# Patient Record
Sex: Male | Born: 1945 | Race: White | Hispanic: No | Smoking: Former smoker
Health system: Southern US, Community
[De-identification: ages and names within clinical notes are randomized; demographics above are authoritative.]

## PROBLEM LIST (undated history)

## (undated) DIAGNOSIS — F329 Major depressive disorder, single episode, unspecified: Secondary | ICD-10-CM

## (undated) DIAGNOSIS — I509 Heart failure, unspecified: Secondary | ICD-10-CM

## (undated) DIAGNOSIS — Z960 Presence of urogenital implants: Secondary | ICD-10-CM

## (undated) DIAGNOSIS — M1990 Unspecified osteoarthritis, unspecified site: Secondary | ICD-10-CM

## (undated) DIAGNOSIS — I2510 Atherosclerotic heart disease of native coronary artery without angina pectoris: Secondary | ICD-10-CM

## (undated) DIAGNOSIS — E1143 Type 2 diabetes mellitus with diabetic autonomic (poly)neuropathy: Secondary | ICD-10-CM

## (undated) DIAGNOSIS — R112 Nausea with vomiting, unspecified: Secondary | ICD-10-CM

## (undated) DIAGNOSIS — M545 Low back pain, unspecified: Secondary | ICD-10-CM

## (undated) DIAGNOSIS — E785 Hyperlipidemia, unspecified: Secondary | ICD-10-CM

## (undated) DIAGNOSIS — R197 Diarrhea, unspecified: Secondary | ICD-10-CM

## (undated) DIAGNOSIS — Z9889 Other specified postprocedural states: Secondary | ICD-10-CM

## (undated) DIAGNOSIS — I82409 Acute embolism and thrombosis of unspecified deep veins of unspecified lower extremity: Secondary | ICD-10-CM

## (undated) DIAGNOSIS — F32A Depression, unspecified: Secondary | ICD-10-CM

## (undated) DIAGNOSIS — G8929 Other chronic pain: Secondary | ICD-10-CM

## (undated) DIAGNOSIS — I639 Cerebral infarction, unspecified: Secondary | ICD-10-CM

## (undated) DIAGNOSIS — N319 Neuromuscular dysfunction of bladder, unspecified: Secondary | ICD-10-CM

## (undated) DIAGNOSIS — E669 Obesity, unspecified: Secondary | ICD-10-CM

## (undated) DIAGNOSIS — G4733 Obstructive sleep apnea (adult) (pediatric): Secondary | ICD-10-CM

## (undated) DIAGNOSIS — Z978 Presence of other specified devices: Secondary | ICD-10-CM

## (undated) DIAGNOSIS — E1140 Type 2 diabetes mellitus with diabetic neuropathy, unspecified: Secondary | ICD-10-CM

## (undated) DIAGNOSIS — N3289 Other specified disorders of bladder: Secondary | ICD-10-CM

## (undated) DIAGNOSIS — K219 Gastro-esophageal reflux disease without esophagitis: Secondary | ICD-10-CM

## (undated) DIAGNOSIS — E1165 Type 2 diabetes mellitus with hyperglycemia: Secondary | ICD-10-CM

## (undated) DIAGNOSIS — I10 Essential (primary) hypertension: Secondary | ICD-10-CM

## (undated) DIAGNOSIS — E119 Type 2 diabetes mellitus without complications: Secondary | ICD-10-CM

## (undated) DIAGNOSIS — E1169 Type 2 diabetes mellitus with other specified complication: Secondary | ICD-10-CM

## (undated) DIAGNOSIS — J189 Pneumonia, unspecified organism: Secondary | ICD-10-CM

## (undated) HISTORY — DX: Type 2 diabetes mellitus with diabetic autonomic (poly)neuropathy: E11.43

## (undated) HISTORY — PX: CHOLECYSTECTOMY OPEN: SUR202

## (undated) HISTORY — DX: Type 2 diabetes mellitus with hyperglycemia: E11.65

## (undated) HISTORY — PX: GASTROPLASTY: SHX192

## (undated) HISTORY — PX: JOINT REPLACEMENT: SHX530

## (undated) HISTORY — DX: Atherosclerotic heart disease of native coronary artery without angina pectoris: I25.10

## (undated) HISTORY — PX: CARPAL TUNNEL RELEASE: SHX101

---

## 1979-05-12 DIAGNOSIS — I82409 Acute embolism and thrombosis of unspecified deep veins of unspecified lower extremity: Secondary | ICD-10-CM

## 1979-05-12 HISTORY — DX: Acute embolism and thrombosis of unspecified deep veins of unspecified lower extremity: I82.409

## 1989-05-11 HISTORY — PX: TOTAL KNEE ARTHROPLASTY: SHX125

## 1997-09-10 DIAGNOSIS — J189 Pneumonia, unspecified organism: Secondary | ICD-10-CM

## 1997-09-10 HISTORY — DX: Pneumonia, unspecified organism: J18.9

## 1998-08-23 ENCOUNTER — Inpatient Hospital Stay (HOSPITAL_COMMUNITY): Admission: EM | Admit: 1998-08-23 | Discharge: 1998-08-26 | Payer: Self-pay | Admitting: Emergency Medicine

## 1998-08-23 ENCOUNTER — Encounter: Payer: Self-pay | Admitting: Emergency Medicine

## 1998-08-23 ENCOUNTER — Encounter: Payer: Self-pay | Admitting: Internal Medicine

## 1998-08-25 ENCOUNTER — Encounter: Payer: Self-pay | Admitting: Internal Medicine

## 1998-09-12 ENCOUNTER — Encounter: Admission: RE | Admit: 1998-09-12 | Discharge: 1998-12-11 | Payer: Self-pay | Admitting: Internal Medicine

## 1999-10-11 ENCOUNTER — Ambulatory Visit (HOSPITAL_BASED_OUTPATIENT_CLINIC_OR_DEPARTMENT_OTHER): Admission: RE | Admit: 1999-10-11 | Discharge: 1999-10-11 | Payer: Self-pay | Admitting: Orthopedic Surgery

## 1999-11-08 ENCOUNTER — Ambulatory Visit (HOSPITAL_BASED_OUTPATIENT_CLINIC_OR_DEPARTMENT_OTHER): Admission: RE | Admit: 1999-11-08 | Discharge: 1999-11-08 | Payer: Self-pay | Admitting: Orthopedic Surgery

## 2002-11-24 ENCOUNTER — Emergency Department (HOSPITAL_COMMUNITY): Admission: EM | Admit: 2002-11-24 | Discharge: 2002-11-24 | Payer: Self-pay | Admitting: Emergency Medicine

## 2002-11-24 ENCOUNTER — Encounter: Payer: Self-pay | Admitting: Emergency Medicine

## 2002-11-25 ENCOUNTER — Encounter: Payer: Self-pay | Admitting: Emergency Medicine

## 2002-11-25 ENCOUNTER — Emergency Department (HOSPITAL_COMMUNITY): Admission: EM | Admit: 2002-11-25 | Discharge: 2002-11-25 | Payer: Self-pay | Admitting: Emergency Medicine

## 2002-12-05 ENCOUNTER — Emergency Department (HOSPITAL_COMMUNITY): Admission: EM | Admit: 2002-12-05 | Discharge: 2002-12-05 | Payer: Self-pay | Admitting: Emergency Medicine

## 2002-12-05 ENCOUNTER — Encounter: Payer: Self-pay | Admitting: Emergency Medicine

## 2004-01-10 ENCOUNTER — Encounter: Admission: RE | Admit: 2004-01-10 | Discharge: 2004-01-10 | Payer: Self-pay | Admitting: Family Medicine

## 2005-08-24 ENCOUNTER — Encounter: Admission: RE | Admit: 2005-08-24 | Discharge: 2005-09-09 | Payer: Self-pay | Admitting: Internal Medicine

## 2006-07-19 ENCOUNTER — Encounter: Admission: RE | Admit: 2006-07-19 | Discharge: 2006-07-19 | Payer: Self-pay | Admitting: Internal Medicine

## 2008-07-25 ENCOUNTER — Emergency Department (HOSPITAL_COMMUNITY): Admission: EM | Admit: 2008-07-25 | Discharge: 2008-07-25 | Payer: Self-pay | Admitting: Emergency Medicine

## 2008-08-30 ENCOUNTER — Encounter (INDEPENDENT_AMBULATORY_CARE_PROVIDER_SITE_OTHER): Payer: Self-pay | Admitting: Urology

## 2008-08-30 ENCOUNTER — Inpatient Hospital Stay (HOSPITAL_COMMUNITY): Admission: RE | Admit: 2008-08-30 | Discharge: 2008-08-31 | Payer: Self-pay | Admitting: Urology

## 2008-09-01 ENCOUNTER — Inpatient Hospital Stay (HOSPITAL_COMMUNITY): Admission: EM | Admit: 2008-09-01 | Discharge: 2008-09-06 | Payer: Self-pay | Admitting: Emergency Medicine

## 2009-02-18 ENCOUNTER — Encounter (HOSPITAL_BASED_OUTPATIENT_CLINIC_OR_DEPARTMENT_OTHER): Admission: RE | Admit: 2009-02-18 | Discharge: 2009-04-12 | Payer: Self-pay | Admitting: General Surgery

## 2010-03-08 ENCOUNTER — Inpatient Hospital Stay (HOSPITAL_COMMUNITY): Admission: EM | Admit: 2010-03-08 | Discharge: 2010-03-11 | Payer: Self-pay | Admitting: Emergency Medicine

## 2010-03-10 ENCOUNTER — Encounter (INDEPENDENT_AMBULATORY_CARE_PROVIDER_SITE_OTHER): Payer: Self-pay | Admitting: Internal Medicine

## 2010-04-05 ENCOUNTER — Emergency Department (HOSPITAL_COMMUNITY): Admission: EM | Admit: 2010-04-05 | Discharge: 2010-04-06 | Payer: Self-pay | Admitting: Emergency Medicine

## 2010-04-10 DEATH — deceased

## 2010-04-12 ENCOUNTER — Encounter (INDEPENDENT_AMBULATORY_CARE_PROVIDER_SITE_OTHER): Payer: Self-pay | Admitting: Emergency Medicine

## 2010-04-12 ENCOUNTER — Ambulatory Visit: Payer: Self-pay | Admitting: Surgery

## 2010-04-13 ENCOUNTER — Inpatient Hospital Stay (HOSPITAL_COMMUNITY): Admission: EM | Admit: 2010-04-13 | Discharge: 2010-04-16 | Payer: Self-pay | Admitting: Emergency Medicine

## 2010-04-19 ENCOUNTER — Inpatient Hospital Stay (HOSPITAL_COMMUNITY): Admission: EM | Admit: 2010-04-19 | Discharge: 2010-04-24 | Payer: Self-pay | Admitting: Emergency Medicine

## 2010-04-27 ENCOUNTER — Inpatient Hospital Stay (HOSPITAL_COMMUNITY): Admission: EM | Admit: 2010-04-27 | Discharge: 2010-04-28 | Payer: Self-pay | Admitting: Emergency Medicine

## 2010-05-23 ENCOUNTER — Emergency Department (HOSPITAL_COMMUNITY): Admission: EM | Admit: 2010-05-23 | Discharge: 2010-05-23 | Payer: Self-pay | Admitting: Emergency Medicine

## 2010-06-10 ENCOUNTER — Emergency Department (HOSPITAL_COMMUNITY): Admission: EM | Admit: 2010-06-10 | Discharge: 2010-06-10 | Payer: Self-pay | Admitting: Emergency Medicine

## 2010-07-08 ENCOUNTER — Emergency Department (HOSPITAL_COMMUNITY): Admission: EM | Admit: 2010-07-08 | Discharge: 2010-07-08 | Payer: Self-pay | Admitting: Emergency Medicine

## 2010-07-25 ENCOUNTER — Emergency Department (HOSPITAL_COMMUNITY): Admission: EM | Admit: 2010-07-25 | Discharge: 2010-07-25 | Payer: Self-pay | Admitting: Emergency Medicine

## 2010-08-25 ENCOUNTER — Inpatient Hospital Stay (HOSPITAL_COMMUNITY)
Admission: EM | Admit: 2010-08-25 | Discharge: 2010-08-27 | Payer: Self-pay | Source: Home / Self Care | Attending: Internal Medicine | Admitting: Internal Medicine

## 2010-08-26 ENCOUNTER — Encounter (INDEPENDENT_AMBULATORY_CARE_PROVIDER_SITE_OTHER): Payer: Self-pay | Admitting: Internal Medicine

## 2010-10-15 ENCOUNTER — Emergency Department (HOSPITAL_COMMUNITY)
Admission: EM | Admit: 2010-10-15 | Discharge: 2010-10-15 | Disposition: A | Discharge status: Home / Self Care | Payer: MEDICARE | Attending: Emergency Medicine | Admitting: Emergency Medicine

## 2010-10-15 DIAGNOSIS — E119 Type 2 diabetes mellitus without complications: Secondary | ICD-10-CM | POA: Insufficient documentation

## 2010-10-15 DIAGNOSIS — N390 Urinary tract infection, site not specified: Secondary | ICD-10-CM | POA: Insufficient documentation

## 2010-10-15 DIAGNOSIS — I10 Essential (primary) hypertension: Secondary | ICD-10-CM | POA: Insufficient documentation

## 2010-10-15 DIAGNOSIS — Z794 Long term (current) use of insulin: Secondary | ICD-10-CM | POA: Insufficient documentation

## 2010-10-15 DIAGNOSIS — Y846 Urinary catheterization as the cause of abnormal reaction of the patient, or of later complication, without mention of misadventure at the time of the procedure: Secondary | ICD-10-CM | POA: Insufficient documentation

## 2010-10-15 DIAGNOSIS — T8389XA Other specified complication of genitourinary prosthetic devices, implants and grafts, initial encounter: Secondary | ICD-10-CM | POA: Insufficient documentation

## 2010-10-15 LAB — URINALYSIS, ROUTINE W REFLEX MICROSCOPIC
Bilirubin Urine: NEGATIVE
Protein, ur: 100 mg/dL — AB
Urine Glucose, Fasting: 250 mg/dL — AB

## 2010-10-15 LAB — URINE MICROSCOPIC-ADD ON

## 2010-10-19 LAB — URINE CULTURE
Colony Count: >=100000
Culture  Setup Time: 201202051247

## 2010-11-19 ENCOUNTER — Emergency Department (HOSPITAL_COMMUNITY)
Admission: EM | Admit: 2010-11-19 | Discharge: 2010-11-19 | Disposition: A | Discharge status: Home / Self Care | Payer: MEDICARE | Attending: Emergency Medicine | Admitting: Emergency Medicine

## 2010-11-19 DIAGNOSIS — N319 Neuromuscular dysfunction of bladder, unspecified: Secondary | ICD-10-CM | POA: Insufficient documentation

## 2010-11-19 DIAGNOSIS — K861 Other chronic pancreatitis: Secondary | ICD-10-CM | POA: Insufficient documentation

## 2010-11-19 DIAGNOSIS — I10 Essential (primary) hypertension: Secondary | ICD-10-CM | POA: Insufficient documentation

## 2010-11-19 DIAGNOSIS — R339 Retention of urine, unspecified: Secondary | ICD-10-CM | POA: Insufficient documentation

## 2010-11-19 DIAGNOSIS — N390 Urinary tract infection, site not specified: Secondary | ICD-10-CM | POA: Insufficient documentation

## 2010-11-19 DIAGNOSIS — Z794 Long term (current) use of insulin: Secondary | ICD-10-CM | POA: Insufficient documentation

## 2010-11-19 DIAGNOSIS — G589 Mononeuropathy, unspecified: Secondary | ICD-10-CM | POA: Insufficient documentation

## 2010-11-19 DIAGNOSIS — K219 Gastro-esophageal reflux disease without esophagitis: Secondary | ICD-10-CM | POA: Insufficient documentation

## 2010-11-19 DIAGNOSIS — N400 Enlarged prostate without lower urinary tract symptoms: Secondary | ICD-10-CM | POA: Insufficient documentation

## 2010-11-19 DIAGNOSIS — E119 Type 2 diabetes mellitus without complications: Secondary | ICD-10-CM | POA: Insufficient documentation

## 2010-11-19 DIAGNOSIS — E785 Hyperlipidemia, unspecified: Secondary | ICD-10-CM | POA: Insufficient documentation

## 2010-11-19 LAB — URINALYSIS, ROUTINE W REFLEX MICROSCOPIC
Bilirubin Urine: NEGATIVE
Glucose, UA: NEGATIVE mg/dL
Ketones, ur: NEGATIVE mg/dL
Nitrite: NEGATIVE
Protein, ur: NEGATIVE mg/dL
Specific Gravity, Urine: 1.011 (ref 1.005–1.030)
Urobilinogen, UA: 0.2 mg/dL (ref 0.0–1.0)
pH: 7 (ref 5.0–8.0)

## 2010-11-19 LAB — URINE MICROSCOPIC-ADD ON

## 2010-11-20 LAB — URINALYSIS, ROUTINE W REFLEX MICROSCOPIC
Glucose, UA: 250 mg/dL — AB
Ketones, ur: NEGATIVE mg/dL
Nitrite: NEGATIVE
Specific Gravity, Urine: 1.028 (ref 1.005–1.030)
pH: 6 (ref 5.0–8.0)

## 2010-11-20 LAB — CARDIAC PANEL(CRET KIN+CKTOT+MB+TROPI)
CK, MB: 2.8 ng/mL (ref 0.3–4.0)
CK, MB: 2.9 ng/mL (ref 0.3–4.0)
Relative Index: 1.7 (ref 0.0–2.5)
Relative Index: 2.1 (ref 0.0–2.5)

## 2010-11-20 LAB — CBC
Hemoglobin: 13.9 g/dL (ref 13.0–17.0)
MCH: 31 pg (ref 26.0–34.0)
MCHC: 34.2 g/dL (ref 30.0–36.0)
MCV: 90.6 fL (ref 78.0–100.0)
Platelets: 215 10*3/uL (ref 150–400)
RBC: 4.48 MIL/uL (ref 4.22–5.81)
WBC: 9 10*3/uL (ref 4.0–10.5)

## 2010-11-20 LAB — VITAMIN B12: Vitamin B-12: 356 pg/mL (ref 211–911)

## 2010-11-20 LAB — URINE MICROSCOPIC-ADD ON

## 2010-11-20 LAB — GLUCOSE, CAPILLARY
Glucose-Capillary: 275 mg/dL — ABNORMAL HIGH (ref 70–99)
Glucose-Capillary: 291 mg/dL — ABNORMAL HIGH (ref 70–99)
Glucose-Capillary: 324 mg/dL — ABNORMAL HIGH (ref 70–99)
Glucose-Capillary: 329 mg/dL — ABNORMAL HIGH (ref 70–99)
Glucose-Capillary: 334 mg/dL — ABNORMAL HIGH (ref 70–99)

## 2010-11-20 LAB — COMPREHENSIVE METABOLIC PANEL
ALT: 20 U/L (ref 0–53)
Albumin: 3.3 g/dL — ABNORMAL LOW (ref 3.5–5.2)
Alkaline Phosphatase: 80 U/L (ref 39–117)
Calcium: 8.6 mg/dL (ref 8.4–10.5)
GFR calc Af Amer: >60 mL/min (ref >60)
GFR calc non Af Amer: >60 mL/min (ref >60)
Total Bilirubin: 0.8 mg/dL (ref 0.3–1.2)
Total Protein: 6.4 g/dL (ref 6.0–8.3)

## 2010-11-20 LAB — DIFFERENTIAL
Basophils Absolute: 0.1 10*3/uL (ref 0.0–0.1)
Eosinophils Absolute: 0.3 10*3/uL (ref 0.0–0.7)
Lymphocytes Relative: 21 % (ref 12–46)
Monocytes Absolute: 0.8 10*3/uL (ref 0.1–1.0)
Monocytes Relative: 8 % (ref 3–12)
Neutro Abs: 5.9 10*3/uL (ref 1.7–7.7)
Neutrophils Relative %: 66 % (ref 43–77)

## 2010-11-20 LAB — LIPID PANEL
Cholesterol: 162 mg/dL (ref 0–200)
HDL: 28 mg/dL — ABNORMAL LOW (ref >39)
LDL Cholesterol: 106 mg/dL — ABNORMAL HIGH (ref 0–99)
Total CHOL/HDL Ratio: 5.8 RATIO
Triglycerides: 140 mg/dL (ref <150)

## 2010-11-20 LAB — CK: Total CK: 139 U/L (ref 7–232)

## 2010-11-20 LAB — URINE CULTURE: Culture  Setup Time: 201112170137

## 2010-11-20 LAB — BLOOD GAS, ARTERIAL: pCO2 arterial: 34.9 mmHg — ABNORMAL LOW (ref 35.0–45.0)

## 2010-11-21 LAB — URINE CULTURE
Colony Count: 50000
Culture  Setup Time: 201111160040

## 2010-11-21 LAB — URINALYSIS, ROUTINE W REFLEX MICROSCOPIC
Ketones, ur: NEGATIVE mg/dL
Protein, ur: 30 mg/dL — AB
Urobilinogen, UA: 1 mg/dL (ref 0.0–1.0)

## 2010-11-21 LAB — POCT I-STAT, CHEM 8
BUN: 15 mg/dL (ref 6–23)
Calcium, Ion: 1.09 mmol/L — ABNORMAL LOW (ref 1.12–1.32)
Creatinine, Ser: 0.7 mg/dL (ref 0.4–1.5)
Glucose, Bld: 245 mg/dL — ABNORMAL HIGH (ref 70–99)
Hemoglobin: 13.6 g/dL (ref 13.0–17.0)
Sodium: 139 mEq/L (ref 135–145)
TCO2: 26 mmol/L (ref 0–100)

## 2010-11-21 LAB — URINE MICROSCOPIC-ADD ON

## 2010-11-22 LAB — URINE MICROSCOPIC-ADD ON

## 2010-11-22 LAB — URINE CULTURE
Colony Count: >=100000
Culture  Setup Time: 201203120000

## 2010-11-22 LAB — URINALYSIS, ROUTINE W REFLEX MICROSCOPIC
Bilirubin Urine: NEGATIVE
Glucose, UA: NEGATIVE mg/dL
Ketones, ur: NEGATIVE mg/dL
Protein, ur: NEGATIVE mg/dL
Urobilinogen, UA: 0.2 mg/dL (ref 0.0–1.0)

## 2010-11-23 LAB — URINE CULTURE
Colony Count: >=100000
Culture  Setup Time: 201110011251

## 2010-11-23 LAB — URINALYSIS, ROUTINE W REFLEX MICROSCOPIC
Bilirubin Urine: NEGATIVE
Glucose, UA: NEGATIVE mg/dL
Hgb urine dipstick: NEGATIVE
pH: 8 (ref 5.0–8.0)

## 2010-11-23 LAB — URINE MICROSCOPIC-ADD ON

## 2010-11-24 LAB — URINALYSIS, ROUTINE W REFLEX MICROSCOPIC
Bilirubin Urine: NEGATIVE
Glucose, UA: NEGATIVE mg/dL
Glucose, UA: NEGATIVE mg/dL
Hgb urine dipstick: NEGATIVE
Ketones, ur: NEGATIVE mg/dL
Leukocytes, UA: NEGATIVE
Nitrite: NEGATIVE
Protein, ur: 30 mg/dL — AB
Protein, ur: NEGATIVE mg/dL
Specific Gravity, Urine: 1.005 (ref 1.005–1.030)
Specific Gravity, Urine: 1.016 (ref 1.005–1.030)
Urobilinogen, UA: 0.2 mg/dL (ref 0.0–1.0)
Urobilinogen, UA: 1 mg/dL (ref 0.0–1.0)
pH: 6 (ref 5.0–8.0)
pH: 6 (ref 5.0–8.0)

## 2010-11-24 LAB — COMPREHENSIVE METABOLIC PANEL
ALT: 25 U/L (ref 0–53)
ALT: 30 U/L (ref 0–53)
ALT: 33 U/L (ref 0–53)
AST: 20 U/L (ref 0–37)
AST: 25 U/L (ref 0–37)
AST: 31 U/L (ref 0–37)
Albumin: 3.1 g/dL — ABNORMAL LOW (ref 3.5–5.2)
Albumin: 3.6 g/dL (ref 3.5–5.2)
Alkaline Phosphatase: 76 U/L (ref 39–117)
Alkaline Phosphatase: 83 U/L (ref 39–117)
BUN: 7 mg/dL (ref 6–23)
CO2: 26 mEq/L (ref 19–32)
CO2: 24 mEq/L (ref 19–32)
CO2: 25 mEq/L (ref 19–32)
Calcium: 9.2 mg/dL (ref 8.4–10.5)
Calcium: 8.4 mg/dL (ref 8.4–10.5)
Chloride: 106 mEq/L (ref 96–112)
Creatinine, Ser: 0.72 mg/dL (ref 0.4–1.5)
Creatinine, Ser: 0.74 mg/dL (ref 0.4–1.5)
Creatinine, Ser: 0.76 mg/dL (ref 0.4–1.5)
GFR calc Af Amer: >60 mL/min (ref >60)
GFR calc Af Amer: >60 mL/min (ref >60)
GFR calc Af Amer: >60 mL/min (ref >60)
GFR calc non Af Amer: >60 mL/min (ref >60)
GFR calc non Af Amer: >60 mL/min (ref >60)
GFR calc non Af Amer: >60 mL/min (ref >60)
Glucose, Bld: 175 mg/dL — ABNORMAL HIGH (ref 70–99)
Glucose, Bld: 130 mg/dL — ABNORMAL HIGH (ref 70–99)
Glucose, Bld: 153 mg/dL — ABNORMAL HIGH (ref 70–99)
Glucose, Bld: 152 mg/dL — ABNORMAL HIGH (ref 70–99)
Potassium: 4.1 mEq/L (ref 3.5–5.1)
Potassium: 3.8 mEq/L (ref 3.5–5.1)
Sodium: 137 mEq/L (ref 135–145)
Sodium: 133 mEq/L — ABNORMAL LOW (ref 135–145)
Sodium: 137 mEq/L (ref 135–145)
Total Bilirubin: 0.8 mg/dL (ref 0.3–1.2)
Total Protein: 7 g/dL (ref 6.0–8.3)
Total Protein: 6.7 g/dL (ref 6.0–8.3)

## 2010-11-24 LAB — DIFFERENTIAL
Basophils Absolute: 0 10*3/uL (ref 0.0–0.1)
Basophils Absolute: 0 10*3/uL (ref 0.0–0.1)
Basophils Relative: 1 % (ref 0–1)
Basophils Relative: 0 % (ref 0–1)
Eosinophils Absolute: 0.4 10*3/uL (ref 0.0–0.7)
Eosinophils Absolute: 0.4 10*3/uL (ref 0.0–0.7)
Eosinophils Relative: 4 % (ref 0–5)
Eosinophils Relative: 3 % (ref 0–5)
Lymphocytes Relative: 13 % (ref 12–46)
Lymphocytes Relative: 19 % (ref 12–46)
Lymphocytes Relative: 10 % — ABNORMAL LOW (ref 12–46)
Lymphs Abs: 1.9 10*3/uL (ref 0.7–4.0)
Lymphs Abs: 1.2 10*3/uL (ref 0.7–4.0)
Monocytes Absolute: 0.5 10*3/uL (ref 0.1–1.0)
Monocytes Absolute: 0.8 10*3/uL (ref 0.1–1.0)
Monocytes Relative: 8 % (ref 3–12)
Monocytes Relative: 7 % (ref 3–12)
Neutro Abs: 11.5 10*3/uL — ABNORMAL HIGH (ref 1.7–7.7)
Neutro Abs: 5.4 10*3/uL (ref 1.7–7.7)
Neutrophils Relative %: 68 % (ref 43–77)
Neutrophils Relative %: 65 % (ref 43–77)
Neutrophils Relative %: 78 % — ABNORMAL HIGH (ref 43–77)
Neutrophils Relative %: 65 % (ref 43–77)

## 2010-11-24 LAB — URINE CULTURE
Colony Count: NO GROWTH
Culture  Setup Time: 201108100845
Culture: NO GROWTH
Culture: NO GROWTH

## 2010-11-24 LAB — BASIC METABOLIC PANEL
Chloride: 109 mEq/L (ref 96–112)
Creatinine, Ser: 0.67 mg/dL (ref 0.4–1.5)
GFR calc Af Amer: >60 mL/min (ref >60)
Potassium: 4 mEq/L (ref 3.5–5.1)

## 2010-11-24 LAB — CBC
HCT: 41.5 % (ref 39.0–52.0)
HCT: 37.5 % — ABNORMAL LOW (ref 39.0–52.0)
HCT: 43.3 % (ref 39.0–52.0)
HCT: 39 % (ref 39.0–52.0)
Hemoglobin: 14.3 g/dL (ref 13.0–17.0)
Hemoglobin: 12.7 g/dL — ABNORMAL LOW (ref 13.0–17.0)
MCH: 30 pg (ref 26.0–34.0)
MCH: 29 pg (ref 26.0–34.0)
MCH: 29.6 pg (ref 26.0–34.0)
MCHC: 34.2 g/dL (ref 30.0–36.0)
MCHC: 32.6 g/dL (ref 30.0–36.0)
MCHC: 32.9 g/dL (ref 30.0–36.0)
MCHC: 33.4 g/dL (ref 30.0–36.0)
MCV: 89.2 fL (ref 78.0–100.0)
MCV: 89.4 fL (ref 78.0–100.0)
Platelets: 213 10*3/uL (ref 150–400)
Platelets: 244 10*3/uL (ref 150–400)
RBC: 4.21 MIL/uL — ABNORMAL LOW (ref 4.22–5.81)
RDW: 15.6 % — ABNORMAL HIGH (ref 11.5–15.5)
RDW: 14.8 % (ref 11.5–15.5)
RDW: 14.9 % (ref 11.5–15.5)
RDW: 14.1 % (ref 11.5–15.5)
RDW: 14.1 % (ref 11.5–15.5)
WBC: 9.4 10*3/uL (ref 4.0–10.5)
WBC: 14.7 10*3/uL — ABNORMAL HIGH (ref 4.0–10.5)
WBC: 9.4 10*3/uL (ref 4.0–10.5)

## 2010-11-24 LAB — GLUCOSE, CAPILLARY
Glucose-Capillary: 150 mg/dL — ABNORMAL HIGH (ref 70–99)
Glucose-Capillary: 178 mg/dL — ABNORMAL HIGH (ref 70–99)
Glucose-Capillary: 188 mg/dL — ABNORMAL HIGH (ref 70–99)
Glucose-Capillary: 109 mg/dL — ABNORMAL HIGH (ref 70–99)
Glucose-Capillary: 117 mg/dL — ABNORMAL HIGH (ref 70–99)
Glucose-Capillary: 135 mg/dL — ABNORMAL HIGH (ref 70–99)
Glucose-Capillary: 144 mg/dL — ABNORMAL HIGH (ref 70–99)
Glucose-Capillary: 160 mg/dL — ABNORMAL HIGH (ref 70–99)
Glucose-Capillary: 161 mg/dL — ABNORMAL HIGH (ref 70–99)
Glucose-Capillary: 162 mg/dL — ABNORMAL HIGH (ref 70–99)
Glucose-Capillary: 171 mg/dL — ABNORMAL HIGH (ref 70–99)
Glucose-Capillary: 179 mg/dL — ABNORMAL HIGH (ref 70–99)
Glucose-Capillary: 211 mg/dL — ABNORMAL HIGH (ref 70–99)
Glucose-Capillary: 133 mg/dL — ABNORMAL HIGH (ref 70–99)
Glucose-Capillary: 142 mg/dL — ABNORMAL HIGH (ref 70–99)
Glucose-Capillary: 145 mg/dL — ABNORMAL HIGH (ref 70–99)
Glucose-Capillary: 156 mg/dL — ABNORMAL HIGH (ref 70–99)
Glucose-Capillary: 162 mg/dL — ABNORMAL HIGH (ref 70–99)
Glucose-Capillary: 190 mg/dL — ABNORMAL HIGH (ref 70–99)
Glucose-Capillary: 218 mg/dL — ABNORMAL HIGH (ref 70–99)

## 2010-11-24 LAB — TISSUE TRANSGLUTAMINASE, IGG: Tissue Transglut Ab: 12.2 U/mL (ref <20)

## 2010-11-24 LAB — POCT CARDIAC MARKERS
Myoglobin, poc: 118 ng/mL (ref 12–200)
Troponin i, poc: <0.05 ng/mL (ref 0.00–0.09)
Troponin i, poc: <0.05 ng/mL (ref 0.00–0.09)

## 2010-11-24 LAB — CLOSTRIDIUM DIFFICILE EIA: C difficile Toxins A+B, EIA: NEGATIVE

## 2010-11-24 LAB — CULTURE, BLOOD (ROUTINE X 2): Culture: NO GROWTH

## 2010-11-24 LAB — URINE MICROSCOPIC-ADD ON

## 2010-11-24 LAB — FECAL FAT, QUALITATIVE
Free fatty acids: INCREASED
Neutral Fat: NORMAL

## 2010-11-24 LAB — MAGNESIUM: Magnesium: 2 mg/dL (ref 1.5–2.5)

## 2010-11-24 LAB — APTT: aPTT: 27 seconds (ref 24–37)

## 2010-11-24 LAB — AFB CULTURE WITH SMEAR (NOT AT ARMC)

## 2010-11-24 LAB — FECAL LACTOFERRIN, QUANT

## 2010-11-24 LAB — LIPASE, BLOOD: Lipase: 28 U/L (ref 11–59)

## 2010-11-24 LAB — GIARDIA/CRYPTOSPORIDIUM SCREEN(EIA)
Cryptosporidium Screen (EIA): NEGATIVE
Giardia Screen - EIA: NEGATIVE

## 2010-11-24 LAB — D-DIMER, QUANTITATIVE: D-Dimer, Quant: 0.62 ug/mL-FEU — ABNORMAL HIGH (ref 0.00–0.48)

## 2010-11-24 LAB — HEMOCCULT GUIAC POC 1CARD (OFFICE): Fecal Occult Bld: NEGATIVE

## 2010-11-25 LAB — URINALYSIS, ROUTINE W REFLEX MICROSCOPIC
Bilirubin Urine: NEGATIVE
Ketones, ur: NEGATIVE mg/dL
Nitrite: NEGATIVE
Protein, ur: NEGATIVE mg/dL
pH: 6.5 (ref 5.0–8.0)

## 2010-11-25 LAB — URINE CULTURE
Colony Count: NO GROWTH
Culture: NO GROWTH

## 2010-11-25 LAB — POCT I-STAT, CHEM 8
Chloride: 105 mEq/L (ref 96–112)
Glucose, Bld: 123 mg/dL — ABNORMAL HIGH (ref 70–99)
HCT: 44 % (ref 39.0–52.0)
Hemoglobin: 15 g/dL (ref 13.0–17.0)
Potassium: 4.1 mEq/L (ref 3.5–5.1)
Sodium: 138 mEq/L (ref 135–145)

## 2010-11-26 LAB — GLUCOSE, CAPILLARY
Glucose-Capillary: 237 mg/dL — ABNORMAL HIGH (ref 70–99)
Glucose-Capillary: 250 mg/dL — ABNORMAL HIGH (ref 70–99)
Glucose-Capillary: 269 mg/dL — ABNORMAL HIGH (ref 70–99)
Glucose-Capillary: 273 mg/dL — ABNORMAL HIGH (ref 70–99)

## 2010-11-26 LAB — GLUCOSE, RANDOM: Glucose, Bld: 373 mg/dL — ABNORMAL HIGH (ref 70–99)

## 2010-11-26 LAB — BASIC METABOLIC PANEL
Chloride: 102 mEq/L (ref 96–112)
GFR calc non Af Amer: >60 mL/min (ref >60)
Potassium: 4.4 mEq/L (ref 3.5–5.1)
Sodium: 137 mEq/L (ref 135–145)

## 2010-11-26 LAB — CBC
HCT: 40.7 % (ref 39.0–52.0)
Hemoglobin: 13.9 g/dL (ref 13.0–17.0)
MCV: 88.8 fL (ref 78.0–100.0)
RBC: 4.58 MIL/uL (ref 4.22–5.81)
WBC: 7.9 10*3/uL (ref 4.0–10.5)

## 2010-11-26 LAB — TSH: TSH: 3.337 u[IU]/mL (ref 0.350–4.500)

## 2010-11-26 LAB — HEMOGLOBIN A1C: Mean Plasma Glucose: 243 mg/dL — ABNORMAL HIGH (ref <117)

## 2010-11-27 LAB — GLUCOSE, CAPILLARY
Glucose-Capillary: 232 mg/dL — ABNORMAL HIGH (ref 70–99)
Glucose-Capillary: 245 mg/dL — ABNORMAL HIGH (ref 70–99)
Glucose-Capillary: 294 mg/dL — ABNORMAL HIGH (ref 70–99)
Glucose-Capillary: 326 mg/dL — ABNORMAL HIGH (ref 70–99)

## 2010-11-27 LAB — DIFFERENTIAL
Basophils Absolute: 0.1 10*3/uL (ref 0.0–0.1)
Basophils Relative: 1 % (ref 0–1)
Eosinophils Absolute: 0.2 10*3/uL (ref 0.0–0.7)
Eosinophils Relative: 1 % (ref 0–5)
Lymphocytes Relative: 12 % (ref 12–46)

## 2010-11-27 LAB — CBC
MCHC: 34 g/dL (ref 30.0–36.0)
MCV: 88.3 fL (ref 78.0–100.0)
Platelets: 249 10*3/uL (ref 150–400)
RDW: 14.1 % (ref 11.5–15.5)
WBC: 11.4 10*3/uL — ABNORMAL HIGH (ref 4.0–10.5)

## 2010-11-27 LAB — SEDIMENTATION RATE: Sed Rate: 13 mm/hr (ref 0–16)

## 2010-11-27 LAB — STOOL CULTURE

## 2010-11-27 LAB — GIARDIA/CRYPTOSPORIDIUM SCREEN(EIA): Cryptosporidium Screen (EIA): NEGATIVE

## 2010-11-27 LAB — BASIC METABOLIC PANEL
BUN: 12 mg/dL (ref 6–23)
Chloride: 102 mEq/L (ref 96–112)
Creatinine, Ser: 0.73 mg/dL (ref 0.4–1.5)

## 2011-01-23 NOTE — Discharge Summary (Signed)
[name], [name]              ACCOUNT NO.:  [id number]   MEDICAL RECORD NO.:  [id number]          PATIENT TYPE:  INP   LOCATION:  [id number]                         FACILITY:  [facility]   PHYSICIAN:  Raynelle Bring, MD      DATE OF BIRTH:  1945-11-27   DATE OF ADMISSION:  08/30/2008  DATE OF DISCHARGE:  08/31/2008                               DISCHARGE SUMMARY   ADMISSION DIAGNOSIS:  Urinary retention.   DISCHARGE DIAGNOSIS:  Same.   HISTORY AND PHYSICAL:  For full details please see admission history and  physical.  Briefly, [name] is a gentleman with a history of urinary  retention.  He was initially managed with alpha blocker therapy and  passed a voiding trial.  He subsequently again developed urinary  retention and underwent a urodynamic study as well as cystoscopy.  He  was felt to have a poorly contractile bladder, although also had a  median lobe that appeared to possibly be obstructing his voiding.  He is  given options on how to manage his urinary retention and elected to  proceed with a transurethral resection of the prostate.   HOSPITAL COURSE:  On August 30, 2008, the patient was taken to the  operating room and underwent a transurethral resection of the prostate.  He tolerated this procedure well without complications.  Postoperatively, he was able to be transferred to a regular hospital  room following recovery from anesthesia.  He was maintained with an  indwelling Foley catheter and maintained on continuous bladder  irrigation overnight.  His bladder irrigation was able to be  discontinued and his urine remained relatively clear.  This catheter was  therefore able to be removed on postoperative day #1 and he underwent a  voiding trial which he passed.  He was able to void multiple times with  an improved urinary stream.  His postvoid residual was found to be  negligible.  He was therefore felt to be stable for discharge.  During  his hospitalization, he was  managed on a sliding scale insulin and his  glucose levels were well controlled.   DISPOSITION:  Home.   DISCHARGE MEDICATIONS:  He was instructed to resume his regular home  medications.  He was given a prescription to take Vicodin as needed for  pain and instructed to use Colace as a stool softener.  He was also  instructed that he may stop his doxazosin.   DISCHARGE INSTRUCTIONS:  He was instructed to be ambulatory and  specifically told to refrain from any heavy lifting, strenuous activity.   FOLLOWUP:  He will follow-up in 3-4 weeks with a PVR.      Raynelle Bring, MD  Electronically Signed    LB/MEDQ  D:  08/31/2008  T:  09/01/2008  Job:  [id number]

## 2011-01-23 NOTE — Assessment & Plan Note (Signed)
[facility]   [name], [name]              ACCOUNT NO.:  [id number]   MEDICAL RECORD NO.:  [id number]      DATE OF BIRTH:  [birth year]   PHYSICIAN:  Kathrin Penner, M.D.    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   HISTORY:  [name] is a 65 year old gentleman currently being treated  by [place] for a plantar diabetic foot ulcer.  He presents today for  reevaluation and treatment.  He has been treated in the past with an  offloading doughnut and hydrogel dressings done daily.   PHYSICAL EXAMINATION:  VITAL SIGNS:  Today, temperature 98.4, pulse 92,  respirations 18, blood pressure 154/72, and capillary blood glucose is  147.  GENERAL:  The patient appears alert and is in no acute distress.  She is morbidly obese.  The ulcer is now completely epithelialized with minimal callus formation  around that area of the foot.  There is no exudate or odor on depression  of the foot of that area.   ASSESSMENT:  Healed diabetic foot ulcer.   DISPOSITION:  The patient should use a moisturizing lotion both on his  foot and legs in that he has some elements of venous stasis disease  also.  I am Referring him for orthotic evaluation, so as to give him  some continued offloading on his right fifth MP joints.  Followup here  will be p.r.n.      Kathrin Penner, M.D.  Electronically Signed     PB/MEDQ  D:  03/21/2009  T:  03/21/2009  Job:  [id number]

## 2011-01-23 NOTE — H&P (Signed)
[name], [name]              ACCOUNT NO.:  [id number]   MEDICAL RECORD NO.:  [id number]          PATIENT TYPE:  INP   LOCATION:  1428                         FACILITY:  [facility]   PHYSICIAN:  Raynelle Bring, MD      DATE OF BIRTH:  [birth year]   DATE OF ADMISSION:  09/01/2008  DATE OF DISCHARGE:                              HISTORY & PHYSICAL   CHIEF COMPLAINT:  Urethral bleeding/clot retention.   HISTORY:  [name] is a 65 year old gentleman who presented to the  emergency department with complaints of urethral bleeding and urinary  retention.  He is status post a transurethral resection of the prostate  on August 30, 2008 and subsequently passed a voiding trial on the  morning of August 31, 2008.  He appeared to be urinating well with his  urine clearing and was felt stable for discharge.  He developed bleeding  per his urethra last evening which was fairly extensive.  He also had  difficulty emptying his bladder and was passing blood clots per his  urethra.  He was therefore brought to the emergency department and was  admitted by Dr. Risa Grill for further evaluation.  A three-way Foley  catheter was placed with return of grossly bloody urine.   PAST MEDICAL HISTORY:  1. Diabetes.  2. Hypertension.  3. Gastroesophageal reflux disease.  4. Morbid obesity.   PAST SURGICAL HISTORY:  Gastric bypass surgery.   MEDICATIONS:  1. Furosemide.  2. Glimepiride  3. Insulin.  4. Klor-Con  5. Ramipril.  6. Metformin.  7. Omeprazole.  8. Vicodin.  9. Colace.  10.Cipro.   ALLERGIES:  ROBAXIN.   FAMILY HISTORY:  Noncontributory.   SOCIAL HISTORY:  The patient does live alone.  He is currently disabled  and does use a walker for ambulation.   REVIEW OF SYSTEMS:  A complete review of systems was performed.  All  systems are reviewed and are otherwise negative.  He specifically denies  any syncopal episodes, dizziness, or chest pain or palpitations.   PHYSICAL EXAM:  VITALS:   Blood pressure 130/80, heart rate 72,  respirations 16.  CONSTITUTIONAL:  The patient is alert and oriented, in no acute  distress.  CARDIOVASCULAR:  Regular rate and rhythm.  LUNGS:  Clear bilaterally.  ABDOMEN:  Obese.  GU: The patient has an indwelling 22-French three-way Foley catheter  draining grossly bloody urine.   LABORATORY DATA:  Hemoglobin 11.3.  Urinalysis 11 - 20 white blood  cells, too numerous count red blood cells and many bacteria.  Serum  creatinine 0.8.   PROCEDURE:  The patient's catheter did not irrigate adequately and was  consistent with not being in the bladder.  The balloon was therefore  deflated and the catheter was advanced into the bladder.  The balloon  was then inflated with 30 mL and subsequent irrigation removed multiple  clots.  The patient was then placed on continuous bladder irrigation  with slight traction on the Foley catheter.  He had also immediate  clearing of his urine.   IMPRESSION:  Clot urinary retention status post transurethral resection  of the  prostate.   PLAN:  He will be placed on continuous bladder irrigation with this  titrated gradually over the next 24 hours.  He will have his hemoglobin  and hematocrit checked later today.      Raynelle Bring, MD  Electronically Signed     LB/MEDQ  D:  09/01/2008  T:  09/02/2008  Job:  [id number]

## 2011-01-23 NOTE — Op Note (Signed)
NAMEASIEL, [name]              ACCOUNT NO.:  [id number]   MEDICAL RECORD NO.:  [id number]          PATIENT TYPE:  INP   LOCATION:  1427                         FACILITY:  [facility]   PHYSICIAN:  Raynelle Bring, MD      DATE OF BIRTH:  03-07-1946   DATE OF PROCEDURE:  08/30/2008  DATE OF DISCHARGE:                               OPERATIVE REPORT   PREOPERATIVE DIAGNOSIS:  Urinary retention.   POSTOPERATIVE DIAGNOSIS:  Urinary retention.   PROCEDURE:  1. Cystoscopy.  2. Transurethral resection of the prostate.   SURGEON:  Dr. Raynelle Bring.   ANESTHESIA:  General.   COMPLICATIONS:  None.   SPECIMENS:  Prostate chips.   DISPOSITION:  Specimens to pathology.   DRAINS:  22-French 3-way Foley catheter.   INDICATION:  [name] is a 65 year old gentleman who presented with  urinary retention.  He was given a voiding trial after initiating alpha-  blocker therapy which he subsequently failed.  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  However, he also had evidence for  obstruction based on cystoscopy with a small median lobe that appeared  to be creating a ball valving effect.  Due to fact that he was a poor  candidate for intermittent catheterization and after discussing options,  the patient did wish to proceed with a transurethral resection of the  prostate to offer him the chance for spontaneous voiding.  The potential  risks, complications, and alternative treatment options were discussed  in detail, and informed consent was obtained.   DESCRIPTION OF PROCEDURE:  The patient was taken to the operating room,  and anesthesia was induced.  He was given preoperative antibiotics,  placed in the dorsal lithotomy position, and prepped and draped in the  usual sterile fashion.  Next, a preoperative time out was performed.  Cystourethroscopy was then performed which demonstrated a normal  anterior  urethra.  The posterior urethra demonstrated a small median  lobe.  The prostate was relatively short with coapting lateral lobes.  On inspection of the bladder, the ureteral orifices were noted to be in  the normal anatomic position.  The remainder of the bladder was free of  any bladder tumors, stones, or other mucosal pathology.  The 28-French  resectoscope sheath was inserted into the bladder under cystoscopic  guidance.  Using the electrocautery loop, the ureteral orifices were  identified and marked.  The prostatic tissue between the bladder neck  and verumontanum was then resected at the 6 o'clock position down to the  prostatic capsule.  The lateral lobes were then sequentially resected  down to the prostatic capsule from the bladder neck to this same level  at the verumontanum.  Electrocautery was used to control any bleeding  sites, and once all adenoma tissue had been adequately resected, the  chips were removed from the bladder with the Urovac evacuator.  The  bladder was then reinspected.  No remaining chips were present.  Hemostasis was ensured, and the resectoscope was then removed.  A 22-  French 3-way Foley catheter  was then inserted into the bladder and  irrigated.  The urine appeared to be clear, and he was placed on  continuous bladder irrigation with a slight amount of traction on the  Foley catheter.  He appeared to tolerate the procedure well and without  complications.  He was able to be awakened and transferred to recovery  unit in satisfactory condition.      Raynelle Bring, MD  Electronically Signed     LB/MEDQ  D:  08/30/2008  T:  08/30/2008  Job:  [id number]

## 2011-01-23 NOTE — Assessment & Plan Note (Signed)
[facility]   NAMETUF, LATONA              ACCOUNT NO.:  [id number]   MEDICAL RECORD NO.:  [id number]      DATE OF BIRTH:  Nov 01, 1945   PHYSICIAN:  Kathrin Penner, M.D.    VISIT DATE:  03/07/2009                                   OFFICE VISIT   PROBLEM:  Diabetic foot ulcer in this 65 year old man with an ulcer  located at the right lateral metatarsophalangeal joint on the plantar  surface.  Ulcer dimensions on today's evaluation are 0.3 x 0.4 x 0.1.  His current therapeutic regimen has been with offloading doughnut and  hydrogel dressings done daily.   EXAM/  On examination today, the patient is feeling generally well and he is  without specific complaints.  His temperature is 97.8, pulse 100, respirations 18, and blood pressure  107/87.  No recorded capillary blood glucose today.  The patient,  however, appears alert and oriented, not in any acute distress.   The ulcer is clean base with no exudate or odor.  His foot is insensate.  The wound itself is clean and granulating.   TREATMENT:  Hydrogel and an offloading doughnut to the foot.  Follow up  in 1 week.      Kathrin Penner, M.D.  Electronically Signed     PB/MEDQ  D:  03/07/2009  T:  03/07/2009  Job:  [id number]

## 2011-01-23 NOTE — Consult Note (Signed)
[name], [name]              ACCOUNT NO.:  [id number]   MEDICAL RECORD NO.:  [id number]          PATIENT TYPE:  REC   LOCATION:  FOOT                         FACILITY:  [facility]   PHYSICIAN:  Elesa Hacker, M.D.        DATE OF BIRTH:  [birth year]   DATE OF CONSULTATION:  02/21/2009  DATE OF DISCHARGE:                                 CONSULTATION   CHIEF COMPLAINT:  Ulceration, right foot.   HISTORY OF PRESENT ILLNESS:  This is a 65 year old obese diabetic male  with diabetes approximately 10 years.  He had a callus on lateral aspect  of his plantar surface of the right foot which he debrided down to and  including lower levels of skin.   PAST MEDICAL HISTORY:  He has been told that he has peripheral vascular  disease, he has hypertension, diabetes with neuropathy.   PAST SURGICAL HISTORY:  He has had knee surgery to left and right,  prostate surgery, and he had an unsuccessful stomach stapling in 1976.   ALLERGIES:  He is allergic to MORPHINE and ROBAXIN.   MEDICATIONS:  Furosemide, glimepiride, Humulin 70/30 insulin, Klor-Con,  ramipril, metformin, and omeprazole.   REVIEW OF SYSTEMS:  Otherwise, negative.   PHYSICAL EXAMINATION:  VITAL SIGNS:  Temperature 98.4, pulse 96,  respirations 20, and blood pressure 132/83.  GENERAL APPEARANCE:  Well-developed, somewhat obese, in no distress.  HEAD:  Normocephalic.  CHEST:  Clear.  HEART:  Regular rhythm.  ABDOMEN:  Obese.  There is a large transverse incision.  EXTREMITIES:  Examination of extremities reveal some venous stasis  disease anteriorly bilaterally.  The foot pulses are weakly palpable.  There is a 0.7 x 1.6 rather superficial ulceration of the right plantar  surface laterally.  There is some slough in the base.   TREATMENT:  Using local anesthetic ointment, the wound was debrided  using a curette and the base of the wound appears relatively clean.  Further treatment will include Prisma, hydrogel, and a dry dressing.   We  used felt around the wound for offloading and a Darco shoe.  See the  patient in 1 week.      Elesa Hacker, M.D.  Electronically Signed     RA/MEDQ  D:  02/21/2009  T:  02/21/2009  Job:  [id number]

## 2011-01-26 NOTE — Op Note (Signed)
[place]  Patient:    [name], [name]                     MRN: [id number] Proc. Date: 11/08/99 Adm. Date:  [id number] Attending:  Schuyler Amor                           Operative Report  PREOPERATIVE DIAGNOSIS:  Left carpal tunnel syndrome.  POSTOPERATIVE DIAGNOSIS:  Left carpal tunnel syndrome.  PROCEDURE:  Left carpal tunnel release.  SURGEON:  Sheral Apley. Burney Gauze, M.D.  ANESTHESIA:  Monitored anesthesia care with 2% plain lidocaine, 0.25% Marcaine field block performed by the surgeon 10 cc total.  TOURNIQUET TIME:  11 minutes.  COMPLICATIONS:  None.  DRAINS:  None.  OPERATIVE REPORT:  Patient was taken to operating room.  After the induction of  adequate IV sedation, the left upper extremity was prepped and draped in the usual sterile fashion.  An Esmarch was used to exsanguinate the limb and the tourniquet was inflated to 250 mmHg.  At this point and time, a combination of 2% plain lidocaine and 0.25% plain Marcaine was injected into the palmar aspect of the left hand in the thenar crease in line with the radial border of the ring finger. Once 10 cc had been deposited into the soft tissues, a 1.5 to 2 cm incision was made in this same area paralleling the radial border of the ring finger in the thenar crease.  The incision was taken down through the skin and subcutaneous tissue until the palmar fascia was identified.  The palmar fascia was split longitudinally, hus exposing the distal 1/3 of the transverse carpal ligament and distal 1/3 of the  transverse carpal ligament was divided using a 15 blade, thus exposing the median nerve and contents of the carpal canal.  Next, using the [employer] Carpal  Tunnel Release system, the remaining 2/3 of the transverse carpal ligament was divided in its entirety.  After this was done, the wound was thoroughly irrigated and inspected and no osseous lesions or ganglions  present.  Once this was completed, the wound was closed with a 3-0 Prolene in a running subcuticular stitch.  Steri-Strips, Xeroform,  4 x 4s, fluffs, and a compressive hand dressing were applied.  The patient tolerated the procedure well and went to recovery room in stable fashion. DD:  11/08/99 TD:  11/08/99 Job: [id number]

## 2011-01-26 NOTE — Op Note (Signed)
[place]. [facility]  Patient:    [name]                      MRN: [id number] Proc. Date: 10/11/99 Adm. Date:  [id number] Attending:  Schuyler Amor                           Operative Report  PREOPERATIVE DIAGNOSIS:  Right carpal tunnel syndrome.  POSTOPERATIVE DIAGNOSIS:  Right carpal tunnel syndrome.  PROCEDURE:  Right carpal tunnel release.  SURGEON:  Sheral Apley. Burney Gauze, M.D.  ANESTHESIA:  Monitored anesthesia care with 2% plain lidocaine and ___ Marcaine  field block, 6 cc performed by the surgeon.  TOURNIQUET TIME:  Ten minutes.  COMPLICATIONS:  None.  DRAINS:  None.  DESCRIPTION OF PROCEDURE:  The patient was taken to the operating room and after the induction of adequate IV sedation, the right upper extremity was prepped and draped in the usual fashion.  An Esmarch was used to exsanguinate the limb, and the tourniquet was inflated to 250 mmHg.  At this point in time, 6 cc of a combination of 2% plain lidocaine and 0.25% plain Marcaine was injected in the palmar aspect of the right hand in the area of the thenar crease, ________ or the ring finger. Once this was done, a 2 cm incision was made in the same spot.  The incision was taken down through the skin and subcutaneous tissues until the palmar fascia was identified.  The palmar fascia was split longitudinally, thus exposing the distal one-third of the transverse carpal ligament.  The distal one-third transverse carpal ligament was divided with a 15 blade, thus exposing the underlying median nerve and _______ carpal canal.  Using the [employer] carpal tunnel lead system, the remaining two-thirds of the transverse carpal ligament was divided n its entirety.  The wound was thoroughly inspected and there were no osseous lesions or ganglions present in the canal.  It was irrigated and closed with a running  3-0 Prolene subcuticular stitch.  Steri-Strips,  Xeroform, 4 x 4x, fluffs, and a  compressive hand dressing was applied.  The patient tolerated the procedure well and went to the recovery room in a stable fashion. DD:  10/11/99 TD:  10/11/99 Job: [id number]

## 2011-01-26 NOTE — Discharge Summary (Signed)
NAMEQUINSTON, JANOW              ACCOUNT NO.:  [id number]   MEDICAL RECORD NO.:  [id number]          PATIENT TYPE:  INP   LOCATION:  1428                         [facility]ACILITY[facility]  [facility]   PHYSICIAN:  Raynelle Bring, MD      DATE OF BIRTH:  [birth year]   DATE OF ADMISSION:  09/01/2008  DATE OF DISCHARGE:  09/06/2008                               DISCHARGE SUMMARY   ADMISSION DIAGNOSES:  1. Urinary retention, status post transurethral resection of prostate.  2. Hematuria.   DISCHARGE DIAGNOSES:  1. Urinary retention, status post transurethral resection of prostate.  2. Hematuria.   HISTORY AND PHYSICAL:  For full details, please see admission history  and physical.  Briefly, Mr. Beh is a 65 year old gentleman with a  history of urinary retention who underwent a transurethral resection of  the prostate on August 30, 2008.  He passed a voiding trial on  September 07, 2008, and was discharged home in good condition.  He  presented to the emergency department on September 01, 2008, with  complaints of gross hematuria and clot urinary retention.  He required  admission to the hospital after evaluation in the emergency department.   HOSPITAL COURSE:  The patient was seen and evaluated by Dr. Risa Grill on  September 01, 2008.  His catheter was hand irrigated and he did have  significant clot within his bladder.  He appeared to have bleeding from  the prostate area, which was well controlled after a catheter was  placed, and this appeared to tamponade off any bleeding from the  prostate.  He was initially maintained on continuous bladder irrigation,  although this was quickly able to be discontinued.  His hematocrit was  checked and was stable at 33.2, indicating that he did not have  significant acute blood loss.  Due to the fact that the patient lived  alone and did not have anybody to help him at home at this point and due  to the fact that he was somewhat deconditioned and is chronically  disabled, he was unable to be discharged home under his own care.  He  therefore underwent an evaluation by physical therapy and occupational  therapy who also worked with the patient during this hospitalization.  Over the next few days, he did continue to get stronger and continued to  work with physical therapy and occupational therapy while awaiting  placement to a skilled nursing facility for the short term.  No beds  were initially available and the patient remained hospitalized.  On  September 06, 2008, the patient felt that he would at this point be able  to take care of himself and was discharged home from the hospital.   DISPOSITION:  Home.   DISCHARGE CONDITION:  Good.   DISCHARGE MEDICATIONS:  He was instructed to resume his regular home  medications, excepting any aspirin, nonsteroidal anti-inflammatory  drugs, or herbal supplements.  He was told to continue prescriptions  including Vicodin as needed for pain and to utilize Colace as a stool  softener.   DISCHARGE INSTRUCTIONS:  He was instructed on routine Foley catheter  care  and was discharged home with a Foley catheter with plans to return  for repeat voiding trial later in the week.      Raynelle Bring, MD  Electronically Signed     LB/MEDQ  D:  09/27/2008  T:  09/28/2008  Job:  [id number]

## 2011-05-14 ENCOUNTER — Emergency Department (HOSPITAL_COMMUNITY)
Admission: EM | Admit: 2011-05-14 | Discharge: 2011-05-14 | Disposition: A | Discharge status: Home / Self Care | Payer: Medicare Other | Attending: Emergency Medicine | Admitting: Emergency Medicine

## 2011-05-14 DIAGNOSIS — E1142 Type 2 diabetes mellitus with diabetic polyneuropathy: Secondary | ICD-10-CM | POA: Insufficient documentation

## 2011-05-14 DIAGNOSIS — E785 Hyperlipidemia, unspecified: Secondary | ICD-10-CM | POA: Insufficient documentation

## 2011-05-14 DIAGNOSIS — T83091A Other mechanical complication of indwelling urethral catheter, initial encounter: Secondary | ICD-10-CM | POA: Insufficient documentation

## 2011-05-14 DIAGNOSIS — R110 Nausea: Secondary | ICD-10-CM | POA: Insufficient documentation

## 2011-05-14 DIAGNOSIS — I10 Essential (primary) hypertension: Secondary | ICD-10-CM | POA: Insufficient documentation

## 2011-05-14 DIAGNOSIS — E1149 Type 2 diabetes mellitus with other diabetic neurological complication: Secondary | ICD-10-CM | POA: Insufficient documentation

## 2011-05-14 DIAGNOSIS — N390 Urinary tract infection, site not specified: Secondary | ICD-10-CM | POA: Insufficient documentation

## 2011-05-14 DIAGNOSIS — Y846 Urinary catheterization as the cause of abnormal reaction of the patient, or of later complication, without mention of misadventure at the time of the procedure: Secondary | ICD-10-CM | POA: Insufficient documentation

## 2011-05-14 DIAGNOSIS — Z86718 Personal history of other venous thrombosis and embolism: Secondary | ICD-10-CM | POA: Insufficient documentation

## 2011-05-14 DIAGNOSIS — N319 Neuromuscular dysfunction of bladder, unspecified: Secondary | ICD-10-CM | POA: Insufficient documentation

## 2011-05-14 DIAGNOSIS — K861 Other chronic pancreatitis: Secondary | ICD-10-CM | POA: Insufficient documentation

## 2011-05-14 DIAGNOSIS — R339 Retention of urine, unspecified: Secondary | ICD-10-CM | POA: Insufficient documentation

## 2011-05-14 LAB — URINALYSIS, ROUTINE W REFLEX MICROSCOPIC
Glucose, UA: NEGATIVE mg/dL
Nitrite: NEGATIVE
Protein, ur: NEGATIVE mg/dL
pH: 6.5 (ref 5.0–8.0)

## 2011-05-14 LAB — URINE MICROSCOPIC-ADD ON

## 2011-05-14 LAB — GLUCOSE, CAPILLARY: Glucose-Capillary: 205 mg/dL — ABNORMAL HIGH (ref 70–99)

## 2011-05-17 LAB — URINE CULTURE: Colony Count: 55000

## 2011-06-13 LAB — URINALYSIS, ROUTINE W REFLEX MICROSCOPIC
Glucose, UA: 250 — AB
Ketones, ur: NEGATIVE
Nitrite: NEGATIVE
Protein, ur: NEGATIVE

## 2011-06-13 LAB — POCT I-STAT, CHEM 8
BUN: 11
Calcium, Ion: 1.12
HCT: 39
Hemoglobin: 13.3
Sodium: 137
TCO2: 28

## 2011-06-15 LAB — GLUCOSE, CAPILLARY
Glucose-Capillary: 105 mg/dL — ABNORMAL HIGH (ref 70–99)
Glucose-Capillary: 111 mg/dL — ABNORMAL HIGH (ref 70–99)
Glucose-Capillary: 114 mg/dL — ABNORMAL HIGH (ref 70–99)
Glucose-Capillary: 125 mg/dL — ABNORMAL HIGH (ref 70–99)
Glucose-Capillary: 158 mg/dL — ABNORMAL HIGH (ref 70–99)
Glucose-Capillary: 170 mg/dL — ABNORMAL HIGH (ref 70–99)
Glucose-Capillary: 170 mg/dL — ABNORMAL HIGH (ref 70–99)
Glucose-Capillary: 198 mg/dL — ABNORMAL HIGH (ref 70–99)
Glucose-Capillary: 215 mg/dL — ABNORMAL HIGH (ref 70–99)
Glucose-Capillary: 216 mg/dL — ABNORMAL HIGH (ref 70–99)
Glucose-Capillary: 241 mg/dL — ABNORMAL HIGH (ref 70–99)
Glucose-Capillary: 256 mg/dL — ABNORMAL HIGH (ref 70–99)
Glucose-Capillary: 96 mg/dL (ref 70–99)

## 2011-06-15 LAB — CBC
HCT: 34.2 % — ABNORMAL LOW (ref 39.0–52.0)
MCHC: 33.2 g/dL (ref 30.0–36.0)
MCV: 90.1 fL (ref 78.0–100.0)
Platelets: 218 10*3/uL (ref 150–400)
RDW: 14.6 % (ref 11.5–15.5)
WBC: 7.7 10*3/uL (ref 4.0–10.5)

## 2011-06-15 LAB — URINE MICROSCOPIC-ADD ON

## 2011-06-15 LAB — BASIC METABOLIC PANEL
BUN: 10 mg/dL (ref 6–23)
Calcium: 8.3 mg/dL — ABNORMAL LOW (ref 8.4–10.5)
Calcium: 9 mg/dL (ref 8.4–10.5)
Creatinine, Ser: 0.76 mg/dL (ref 0.4–1.5)
GFR calc Af Amer: >60 mL/min (ref >60)
GFR calc non Af Amer: >60 mL/min (ref >60)
GFR calc non Af Amer: >60 mL/min (ref >60)
Glucose, Bld: 180 mg/dL — ABNORMAL HIGH (ref 70–99)
Potassium: 4.2 mEq/L (ref 3.5–5.1)
Sodium: 140 mEq/L (ref 135–145)

## 2011-06-15 LAB — URINALYSIS, ROUTINE W REFLEX MICROSCOPIC
Ketones, ur: 40 mg/dL — AB
Nitrite: POSITIVE — AB
pH: 6 (ref 5.0–8.0)

## 2011-06-15 LAB — DIFFERENTIAL
Basophils Relative: 1 % (ref 0–1)
Eosinophils Absolute: 0.2 10*3/uL (ref 0.0–0.7)
Eosinophils Relative: 3 % (ref 0–5)
Neutrophils Relative %: 75 % (ref 43–77)

## 2011-06-15 LAB — POCT I-STAT, CHEM 8
Calcium, Ion: 1.12 mmol/L (ref 1.12–1.32)
Creatinine, Ser: 0.8 mg/dL (ref 0.4–1.5)
Glucose, Bld: 246 mg/dL — ABNORMAL HIGH (ref 70–99)
Potassium: 4.1 mEq/L (ref 3.5–5.1)
Sodium: 137 mEq/L (ref 135–145)

## 2011-06-15 LAB — URINE CULTURE: Colony Count: NO GROWTH

## 2011-06-15 LAB — HEMOGLOBIN AND HEMATOCRIT, BLOOD
HCT: 33.2 % — ABNORMAL LOW (ref 39.0–52.0)
HCT: 35.9 % — ABNORMAL LOW (ref 39.0–52.0)
Hemoglobin: 12 g/dL — ABNORMAL LOW (ref 13.0–17.0)

## 2011-07-12 ENCOUNTER — Emergency Department (HOSPITAL_COMMUNITY)
Admission: EM | Admit: 2011-07-12 | Discharge: 2011-07-13 | Disposition: A | Discharge status: Home / Self Care | Payer: Medicare Other | Attending: Emergency Medicine | Admitting: Emergency Medicine

## 2011-07-12 DIAGNOSIS — E119 Type 2 diabetes mellitus without complications: Secondary | ICD-10-CM | POA: Insufficient documentation

## 2011-07-12 DIAGNOSIS — T83091A Other mechanical complication of indwelling urethral catheter, initial encounter: Secondary | ICD-10-CM | POA: Insufficient documentation

## 2011-07-12 DIAGNOSIS — N319 Neuromuscular dysfunction of bladder, unspecified: Secondary | ICD-10-CM | POA: Insufficient documentation

## 2011-07-12 DIAGNOSIS — B372 Candidiasis of skin and nail: Secondary | ICD-10-CM | POA: Insufficient documentation

## 2011-07-12 DIAGNOSIS — I10 Essential (primary) hypertension: Secondary | ICD-10-CM | POA: Insufficient documentation

## 2011-07-12 DIAGNOSIS — N342 Other urethritis: Secondary | ICD-10-CM | POA: Insufficient documentation

## 2011-07-12 DIAGNOSIS — Y846 Urinary catheterization as the cause of abnormal reaction of the patient, or of later complication, without mention of misadventure at the time of the procedure: Secondary | ICD-10-CM | POA: Insufficient documentation

## 2011-07-13 LAB — URINALYSIS, ROUTINE W REFLEX MICROSCOPIC
Ketones, ur: NEGATIVE mg/dL
Nitrite: NEGATIVE
Protein, ur: 100 mg/dL — AB

## 2011-07-13 LAB — URINE MICROSCOPIC-ADD ON

## 2011-07-13 LAB — POCT I-STAT, CHEM 8
Chloride: 102 mEq/L (ref 96–112)
HCT: 45 % (ref 39.0–52.0)
Potassium: 4.2 mEq/L (ref 3.5–5.1)
Sodium: 134 mEq/L — ABNORMAL LOW (ref 135–145)

## 2011-07-16 LAB — URINE CULTURE: Colony Count: >=100000

## 2011-07-17 NOTE — ED Notes (Signed)
+   Urine No rx given.Chart sent to [place] office for review.

## 2011-07-24 ENCOUNTER — Telehealth (HOSPITAL_COMMUNITY): Payer: Self-pay | Admitting: *Deleted

## 2011-07-24 NOTE — ED Notes (Signed)
If no rx's and no allergies -Cipro 500 mg BID x 7 days written by Latanya Presser.

## 2011-07-25 ENCOUNTER — Telehealth (HOSPITAL_COMMUNITY): Payer: Self-pay | Admitting: Emergency Medicine

## 2011-10-10 ENCOUNTER — Emergency Department (HOSPITAL_COMMUNITY): Payer: Medicare Other

## 2011-10-10 ENCOUNTER — Encounter (HOSPITAL_COMMUNITY): Payer: Self-pay | Admitting: Emergency Medicine

## 2011-10-10 ENCOUNTER — Other Ambulatory Visit: Payer: Self-pay

## 2011-10-10 ENCOUNTER — Inpatient Hospital Stay (HOSPITAL_COMMUNITY)
Admission: EM | Admit: 2011-10-10 | Discharge: 2011-10-14 | DRG: 690 | Disposition: A | Discharge status: Home / Self Care | Payer: Medicare Other | Attending: Internal Medicine | Admitting: Internal Medicine

## 2011-10-10 DIAGNOSIS — E1149 Type 2 diabetes mellitus with other diabetic neurological complication: Secondary | ICD-10-CM | POA: Diagnosis present

## 2011-10-10 DIAGNOSIS — G4733 Obstructive sleep apnea (adult) (pediatric): Secondary | ICD-10-CM | POA: Insufficient documentation

## 2011-10-10 DIAGNOSIS — Z794 Long term (current) use of insulin: Secondary | ICD-10-CM | POA: Diagnosis present

## 2011-10-10 DIAGNOSIS — R739 Hyperglycemia, unspecified: Secondary | ICD-10-CM | POA: Insufficient documentation

## 2011-10-10 DIAGNOSIS — E0800 Diabetes mellitus due to underlying condition with hyperosmolarity without nonketotic hyperglycemic-hyperosmolar coma (NKHHC): Secondary | ICD-10-CM | POA: Diagnosis present

## 2011-10-10 DIAGNOSIS — G4730 Sleep apnea, unspecified: Secondary | ICD-10-CM | POA: Diagnosis present

## 2011-10-10 DIAGNOSIS — E1140 Type 2 diabetes mellitus with diabetic neuropathy, unspecified: Secondary | ICD-10-CM | POA: Insufficient documentation

## 2011-10-10 DIAGNOSIS — E1142 Type 2 diabetes mellitus with diabetic polyneuropathy: Secondary | ICD-10-CM | POA: Diagnosis present

## 2011-10-10 DIAGNOSIS — I059 Rheumatic mitral valve disease, unspecified: Secondary | ICD-10-CM

## 2011-10-10 DIAGNOSIS — N3289 Other specified disorders of bladder: Secondary | ICD-10-CM | POA: Diagnosis present

## 2011-10-10 DIAGNOSIS — R301 Vesical tenesmus: Secondary | ICD-10-CM | POA: Diagnosis present

## 2011-10-10 DIAGNOSIS — F29 Unspecified psychosis not due to a substance or known physiological condition: Secondary | ICD-10-CM | POA: Diagnosis present

## 2011-10-10 DIAGNOSIS — R112 Nausea with vomiting, unspecified: Secondary | ICD-10-CM | POA: Diagnosis present

## 2011-10-10 DIAGNOSIS — B9689 Other specified bacterial agents as the cause of diseases classified elsewhere: Secondary | ICD-10-CM | POA: Diagnosis present

## 2011-10-10 DIAGNOSIS — E118 Type 2 diabetes mellitus with unspecified complications: Secondary | ICD-10-CM

## 2011-10-10 DIAGNOSIS — E860 Dehydration: Secondary | ICD-10-CM | POA: Insufficient documentation

## 2011-10-10 DIAGNOSIS — R3989 Other symptoms and signs involving the genitourinary system: Secondary | ICD-10-CM | POA: Diagnosis present

## 2011-10-10 DIAGNOSIS — N390 Urinary tract infection, site not specified: Principal | ICD-10-CM | POA: Diagnosis present

## 2011-10-10 DIAGNOSIS — I10 Essential (primary) hypertension: Secondary | ICD-10-CM | POA: Diagnosis present

## 2011-10-10 HISTORY — DX: Type 2 diabetes mellitus with other specified complication: E11.69

## 2011-10-10 HISTORY — DX: Presence of other specified devices: Z97.8

## 2011-10-10 HISTORY — DX: Other specified postprocedural states: Z98.890

## 2011-10-10 HISTORY — DX: Essential (primary) hypertension: I10

## 2011-10-10 HISTORY — DX: Obesity, unspecified: E66.9

## 2011-10-10 HISTORY — DX: Other specified disorders of bladder: N32.89

## 2011-10-10 HISTORY — DX: Type 2 diabetes mellitus with diabetic neuropathy, unspecified: E11.40

## 2011-10-10 HISTORY — DX: Diarrhea, unspecified: R19.7

## 2011-10-10 HISTORY — DX: Presence of urogenital implants: Z96.0

## 2011-10-10 HISTORY — DX: Other specified postprocedural states: R11.2

## 2011-10-10 LAB — GLUCOSE, CAPILLARY
Glucose-Capillary: 272 mg/dL — ABNORMAL HIGH (ref 70–99)
Glucose-Capillary: 275 mg/dL — ABNORMAL HIGH (ref 70–99)
Glucose-Capillary: 344 mg/dL — ABNORMAL HIGH (ref 70–99)
Glucose-Capillary: 165 mg/dL — ABNORMAL HIGH (ref 70–99)
Glucose-Capillary: 201 mg/dL — ABNORMAL HIGH (ref 70–99)

## 2011-10-10 LAB — COMPREHENSIVE METABOLIC PANEL
ALT: 24 U/L (ref 0–53)
AST: 18 U/L (ref 0–37)
Alkaline Phosphatase: 84 U/L (ref 39–117)
CO2: 25 mEq/L (ref 19–32)
Chloride: 98 mEq/L (ref 96–112)
GFR calc Af Amer: >90 mL/min (ref >90)
GFR calc non Af Amer: >90 mL/min (ref >90)
Glucose, Bld: 277 mg/dL — ABNORMAL HIGH (ref 70–99)
Potassium: 3.9 mEq/L (ref 3.5–5.1)
Sodium: 134 mEq/L — ABNORMAL LOW (ref 135–145)
Total Bilirubin: 0.5 mg/dL (ref 0.3–1.2)

## 2011-10-10 LAB — URINALYSIS, ROUTINE W REFLEX MICROSCOPIC
Bilirubin Urine: NEGATIVE
Ketones, ur: 40 mg/dL — AB
Nitrite: POSITIVE — AB
Protein, ur: 100 mg/dL — AB
Specific Gravity, Urine: 1.034 — ABNORMAL HIGH (ref 1.005–1.030)
Urobilinogen, UA: 1 mg/dL (ref 0.0–1.0)

## 2011-10-10 LAB — DIFFERENTIAL
Basophils Absolute: 0 10*3/uL (ref 0.0–0.1)
Eosinophils Relative: 1 % (ref 0–5)
Lymphocytes Relative: 13 % (ref 12–46)
Lymphs Abs: 1.1 10*3/uL (ref 0.7–4.0)
Neutro Abs: 7.4 10*3/uL (ref 1.7–7.7)

## 2011-10-10 LAB — RAPID URINE DRUG SCREEN, HOSP PERFORMED
Barbiturates: NOT DETECTED
Benzodiazepines: NOT DETECTED
Cocaine: NOT DETECTED
Opiates: NOT DETECTED
Tetrahydrocannabinol: NOT DETECTED

## 2011-10-10 LAB — CBC
HCT: 39.3 % (ref 39.0–52.0)
MCH: 30.3 pg (ref 26.0–34.0)
MCV: 86.2 fL (ref 78.0–100.0)
MCV: 86.6 fL (ref 78.0–100.0)
Platelets: 218 10*3/uL (ref 150–400)
Platelets: 231 10*3/uL (ref 150–400)
RBC: 4.56 MIL/uL (ref 4.22–5.81)
RDW: 13.5 % (ref 11.5–15.5)
RDW: 13.4 % (ref 11.5–15.5)
WBC: 9.1 10*3/uL (ref 4.0–10.5)
WBC: 8.9 10*3/uL (ref 4.0–10.5)

## 2011-10-10 LAB — CARDIAC PANEL(CRET KIN+CKTOT+MB+TROPI)
CK, MB: 2.6 ng/mL (ref 0.3–4.0)
Total CK: 57 U/L (ref 7–232)

## 2011-10-10 LAB — URINE MICROSCOPIC-ADD ON

## 2011-10-10 LAB — PROTIME-INR: INR: 1.03 (ref 0.00–1.49)

## 2011-10-10 MED ORDER — OXYBUTYNIN CHLORIDE 5 MG PO TABS
5.0000 mg | ORAL_TABLET | Freq: Three times a day (TID) | ORAL | Status: DC
  Administered 2011-10-10 – 2011-10-13 (×12): 5 mg via ORAL
  Filled 2011-10-10 (×17): qty 1

## 2011-10-10 MED ORDER — CIPROFLOXACIN IN D5W 400 MG/200ML IV SOLN
400.0000 mg | Freq: Once | INTRAVENOUS | Status: AC
  Administered 2011-10-10: 400 mg via INTRAVENOUS
  Filled 2011-10-10: qty 200

## 2011-10-10 MED ORDER — ACETAMINOPHEN 650 MG RE SUPP
650.0000 mg | Freq: Four times a day (QID) | RECTAL | Status: DC | PRN
  Filled 2011-10-10: qty 1

## 2011-10-10 MED ORDER — CIPROFLOXACIN IN D5W 400 MG/200ML IV SOLN
400.0000 mg | Freq: Two times a day (BID) | INTRAVENOUS | Status: DC
  Administered 2011-10-10 – 2011-10-13 (×6): 400 mg via INTRAVENOUS
  Filled 2011-10-10 (×8): qty 200

## 2011-10-10 MED ORDER — ONDANSETRON HCL 4 MG/2ML IJ SOLN
INTRAMUSCULAR | Status: AC
  Administered 2011-10-10: 4 mg via INTRAVENOUS
  Filled 2011-10-10: qty 2

## 2011-10-10 MED ORDER — ENOXAPARIN SODIUM 40 MG/0.4ML ~~LOC~~ SOLN: 40.0000 mg | SUBCUTANEOUS | Status: DC

## 2011-10-10 MED ORDER — PROMETHAZINE HCL 25 MG/ML IJ SOLN
12.5000 mg | Freq: Four times a day (QID) | INTRAMUSCULAR | Status: DC | PRN
  Administered 2011-10-10: 12.5 mg via INTRAVENOUS
  Administered 2011-10-11 – 2011-10-12 (×3): 25 mg via INTRAVENOUS
  Filled 2011-10-10 (×4): qty 1

## 2011-10-10 MED ORDER — ACETAMINOPHEN 325 MG PO TABS
650.0000 mg | ORAL_TABLET | Freq: Four times a day (QID) | ORAL | Status: DC | PRN
  Administered 2011-10-10: 650 mg via ORAL
  Filled 2011-10-10 (×2): qty 2

## 2011-10-10 MED ORDER — SODIUM CHLORIDE 0.9 % IV SOLN
INTRAVENOUS | Status: DC
  Administered 2011-10-10 – 2011-10-11 (×2): via INTRAVENOUS

## 2011-10-10 MED ORDER — ONDANSETRON HCL 4 MG PO TABS
4.0000 mg | ORAL_TABLET | Freq: Four times a day (QID) | ORAL | Status: DC | PRN
  Filled 2011-10-10: qty 1

## 2011-10-10 MED ORDER — RAMIPRIL 10 MG PO CAPS
10.0000 mg | ORAL_CAPSULE | Freq: Every day | ORAL | Status: DC
  Administered 2011-10-10 – 2011-10-13 (×4): 10 mg via ORAL
  Filled 2011-10-10 (×6): qty 1

## 2011-10-10 MED ORDER — INSULIN ASPART 100 UNIT/ML ~~LOC~~ SOLN
0.0000 [IU] | Freq: Three times a day (TID) | SUBCUTANEOUS | Status: DC
  Administered 2011-10-10: 11 [IU] via SUBCUTANEOUS
  Administered 2011-10-10: 15 [IU] via SUBCUTANEOUS
  Administered 2011-10-11 (×2): 7 [IU] via SUBCUTANEOUS
  Administered 2011-10-11: 11 [IU] via SUBCUTANEOUS
  Administered 2011-10-12: 7 [IU] via SUBCUTANEOUS
  Administered 2011-10-12: 15 [IU] via SUBCUTANEOUS
  Administered 2011-10-12 – 2011-10-13 (×3): 7 [IU] via SUBCUTANEOUS
  Administered 2011-10-13: 11 [IU] via SUBCUTANEOUS
  Administered 2011-10-14: 7 [IU] via SUBCUTANEOUS
  Filled 2011-10-10: qty 3

## 2011-10-10 MED ORDER — INSULIN GLARGINE 100 UNIT/ML ~~LOC~~ SOLN
20.0000 [IU] | Freq: Every day | SUBCUTANEOUS | Status: DC
  Administered 2011-10-10 – 2011-10-11 (×2): 20 [IU] via SUBCUTANEOUS
  Filled 2011-10-10: qty 3

## 2011-10-10 MED ORDER — PREGABALIN 50 MG PO CAPS
50.0000 mg | ORAL_CAPSULE | Freq: Three times a day (TID) | ORAL | Status: DC
  Administered 2011-10-10 – 2011-10-11 (×5): 50 mg via ORAL
  Filled 2011-10-10 (×6): qty 1

## 2011-10-10 MED ORDER — ENOXAPARIN SODIUM 100 MG/ML ~~LOC~~ SOLN
90.0000 mg | SUBCUTANEOUS | Status: DC
  Administered 2011-10-10 – 2011-10-13 (×4): 90 mg via SUBCUTANEOUS
  Filled 2011-10-10 (×5): qty 1

## 2011-10-10 MED ORDER — INFLUENZA VIRUS VACC SPLIT PF IM SUSP
0.5000 mL | INTRAMUSCULAR | Status: AC
  Administered 2011-10-12: 0.5 mL via INTRAMUSCULAR
  Filled 2011-10-10: qty 0.5

## 2011-10-10 MED ORDER — DIPHENOXYLATE-ATROPINE 2.5-0.025 MG PO TABS
1.0000 | ORAL_TABLET | Freq: Four times a day (QID) | ORAL | Status: DC | PRN
Start: 2011-10-10 — End: 2011-10-14
  Administered 2011-10-12: 1 via ORAL
  Filled 2011-10-10: qty 1

## 2011-10-10 MED ORDER — ONDANSETRON HCL 4 MG/2ML IJ SOLN
4.0000 mg | Freq: Four times a day (QID) | INTRAMUSCULAR | Status: DC | PRN
  Administered 2011-10-10 – 2011-10-12 (×4): 4 mg via INTRAVENOUS
  Filled 2011-10-10 (×4): qty 2

## 2011-10-10 MED ORDER — INSULIN ASPART 100 UNIT/ML ~~LOC~~ SOLN
6.0000 [IU] | Freq: Three times a day (TID) | SUBCUTANEOUS | Status: DC
  Administered 2011-10-10 – 2011-10-14 (×10): 6 [IU] via SUBCUTANEOUS

## 2011-10-10 MED ORDER — PANTOPRAZOLE SODIUM 40 MG PO TBEC
40.0000 mg | DELAYED_RELEASE_TABLET | Freq: Every day | ORAL | Status: DC
  Administered 2011-10-10 – 2011-10-14 (×5): 40 mg via ORAL
  Filled 2011-10-10 (×7): qty 1

## 2011-10-10 NOTE — ED Notes (Signed)
MD at bedside. 

## 2011-10-10 NOTE — ED Provider Notes (Signed)
History     CSN: [id number]  Arrival date & time 10/10/11  [id number]   First MD Initiated Contact with Patient 10/10/11 [id number]      Chief Complaint  Patient presents with  . Headache  . Dizziness  . Urinary Tract Infection    (Consider location/radiation/quality/duration/timing/severity/associated sxs/prior treatment) Patient is a 66 y.o. male presenting with headaches and urinary tract infection. The history is provided by the patient and a friend.  Headache  This is a new problem. Pertinent negatives include no fever, no palpitations, no shortness of breath, no nausea and no vomiting.  Urinary Tract Infection Associated symptoms include headaches. Pertinent negatives include no chest pain, no abdominal pain and no shortness of breath.   Pt p/w AMS starting at 6 pm tonight after taking Lomotil and Ditropan. Since that time he has had episodic HA and and waxing and waning consciousness. Pt denies CP, SOB, cough, abd pain. Friend at bedside states pt has been confused since taking the medications  Past Medical History  Diagnosis Date  . Obese   . Hypertension   . Diabetes mellitus   . Diabetic diarrhea   . Sleep apnea   . Diabetic neuropathy, painful   . Chronic indwelling foley catheter   . Bladder spasms   . Complication of anesthesia   . PONV (postoperative nausea and vomiting)     Past Surgical History  Procedure Date  . Joint replacement     Left Total Knee  . Gastroplasty   . Cholecystectomy     History reviewed. No pertinent family history.  History  Substance Use Topics  . Smoking status: Never Smoker   . Smokeless tobacco: Never Used  . Alcohol Use: No      Review of Systems  Constitutional: Negative for fever and chills.  Respiratory: Negative for chest tightness and shortness of breath.   Cardiovascular: Negative for chest pain and palpitations.  Gastrointestinal: Negative for nausea, vomiting, abdominal pain and diarrhea.  Musculoskeletal: Negative  for myalgias, back pain, joint swelling, arthralgias and gait problem.  Neurological: Positive for headaches. Negative for weakness and numbness.    Allergies  Cefadroxil; Cephalexin; Lipitor; Metformin and related; Morphine and related; and Robaxin  Home Medications   No current outpatient prescriptions on file.  BP 158/85  Pulse 80  Temp(Src) 98.2 F (36.8 C) (Oral)  Resp 20  Ht 6\' 1"  (1.854 m)  Wt 389 lb 5.3 oz (176.6 kg)  BMI 51.37 kg/m2  SpO2 96%  Physical Exam  Nursing note and vitals reviewed. Constitutional: He is oriented to person, place, and time. He appears well-developed and well-nourished.  HENT:  Head: Normocephalic and atraumatic.  Mouth/Throat: Oropharynx is clear and moist.  Eyes: EOM are normal. Pupils are equal, round, and reactive to light.  Neck: Normal range of motion. Neck supple.  Cardiovascular: Normal rate and regular rhythm.   Pulmonary/Chest: Effort normal and breath sounds normal. No respiratory distress. He has no wheezes. He has no rales.  Abdominal: Soft. Bowel sounds are normal. There is no tenderness. There is no rebound and no guarding.  Musculoskeletal: Normal range of motion. He exhibits edema (2+ edema bl LE.). He exhibits no tenderness.  Neurological: He is oriented to person, place, and time.       Pt is drowsy but able to be aroused. Follows simple commands. Oriented x 3. 5/5 motor bl UE and 4/5 motor bl LE. CN II-XII grossly intact  Skin: Skin is warm and dry. No rash noted. No  erythema.  Psychiatric: He has a normal mood and affect. His behavior is normal.    ED Course  Procedures (including critical care time)  Labs Reviewed  DIFFERENTIAL - Abnormal; Notable for the following:    Neutrophils Relative 82 (*)    All other components within normal limits  COMPREHENSIVE METABOLIC PANEL - Abnormal; Notable for the following:    Sodium 134 (*)    Glucose, Bld 277 (*)    Albumin 3.4 (*)    All other components within normal  limits  URINALYSIS, ROUTINE W REFLEX MICROSCOPIC - Abnormal; Notable for the following:    APPearance CLOUDY (*)    Specific Gravity, Urine 1.034 (*)    Glucose, UA >1000 (*)    Hgb urine dipstick MODERATE (*)    Ketones, ur 40 (*)    Protein, ur 100 (*)    Nitrite POSITIVE (*)    Leukocytes, UA SMALL (*)    All other components within normal limits  SALICYLATE LEVEL - Abnormal; Notable for the following:    Salicylate Lvl 123456 (*)    All other components within normal limits  URINE MICROSCOPIC-ADD ON - Abnormal; Notable for the following:    Bacteria, UA MANY (*)    All other components within normal limits  GLUCOSE, CAPILLARY - Abnormal; Notable for the following:    Glucose-Capillary 272 (*)    All other components within normal limits  GLUCOSE, CAPILLARY - Abnormal; Notable for the following:    Glucose-Capillary 252 (*)    All other components within normal limits  HEMOGLOBIN A1C - Abnormal; Notable for the following:    Hemoglobin A1C 10.1 (*)    Mean Plasma Glucose 243 (*)    All other components within normal limits  GLUCOSE, CAPILLARY - Abnormal; Notable for the following:    Glucose-Capillary 275 (*)    All other components within normal limits  GLUCOSE, CAPILLARY - Abnormal; Notable for the following:    Glucose-Capillary 252 (*)    All other components within normal limits  CARDIAC PANEL(CRET KIN+CKTOT+MB+TROPI) - Abnormal; Notable for the following:    CK, MB 4.3 (*)    Relative Index 3.6 (*)    All other components within normal limits  COMPREHENSIVE METABOLIC PANEL - Abnormal; Notable for the following:    Glucose, Bld 291 (*)    Albumin 3.1 (*)    All other components within normal limits  GLUCOSE, CAPILLARY - Abnormal; Notable for the following:    Glucose-Capillary 344 (*)    All other components within normal limits  GLUCOSE, CAPILLARY - Abnormal; Notable for the following:    Glucose-Capillary 165 (*)    All other components within normal limits    GLUCOSE, CAPILLARY - Abnormal; Notable for the following:    Glucose-Capillary 201 (*)    All other components within normal limits  CARDIAC PANEL(CRET KIN+CKTOT+MB+TROPI) - Abnormal; Notable for the following:    CK, MB 4.1 (*)    Relative Index 3.5 (*)    All other components within normal limits  GLUCOSE, CAPILLARY - Abnormal; Notable for the following:    Glucose-Capillary 254 (*)    All other components within normal limits  GLUCOSE, CAPILLARY - Abnormal; Notable for the following:    Glucose-Capillary 226 (*)    All other components within normal limits  BASIC METABOLIC PANEL - Abnormal; Notable for the following:    Glucose, Bld 202 (*)    All other components within normal limits  CBC - Abnormal; Notable for  the following:    Hemoglobin 12.9 (*)    HCT 38.5 (*)    All other components within normal limits  GLUCOSE, CAPILLARY - Abnormal; Notable for the following:    Glucose-Capillary 223 (*)    All other components within normal limits  GLUCOSE, CAPILLARY - Abnormal; Notable for the following:    Glucose-Capillary 248 (*)    All other components within normal limits  CBC  PROTIME-INR  APTT  ETHANOL  URINE RAPID DRUG SCREEN (HOSP PERFORMED)  ACETAMINOPHEN LEVEL  URINE CULTURE  CARDIAC PANEL(CRET KIN+CKTOT+MB+TROPI)  CBC  CREATININE, SERUM  DIFFERENTIAL  CARDIAC PANEL(CRET KIN+CKTOT+MB+TROPI)  CARDIAC PANEL(CRET KIN+CKTOT+MB+TROPI)   No results found.   No diagnosis found.   Date: 10/10/2011  Rate:69   Rhythm: normal sinus rhythm  QRS Axis: normal  Intervals: normal  ST/T Wave abnormalities: nonspecific ST changes  Conduction Disutrbances:Incomplete LBBB seen on old EKG  Narrative Interpretation:   Old EKG Reviewed: unchanged   MDM          Julianne Rice, MD 10/12/11 [phone]

## 2011-10-10 NOTE — Progress Notes (Signed)
ED CM received referral to assist with sleep apnea DME.  CM noted pt had [facility]) services previously.  Spoke to DME representative of [facility] who confirms obtaining this DME is a process.  Pt needs to have a sleep apnea study completed or a date to have it done before assistance with DME can be offered.  ED CM spoke with Dr Aileen Fass about obtaining sleep study.  This is an outpatient procedure per MD.

## 2011-10-10 NOTE — ED Notes (Signed)
Per ems, the patient has a foley cath with dark for a few days and has a headache that has been coming and going since 8 pm

## 2011-10-10 NOTE — ED Notes (Signed)
Cipro has infused---pt. Tolerated well--Also reports decrease in nausea.

## 2011-10-10 NOTE — Progress Notes (Signed)
CM spoke with pt about CM referral for sleep apnea DME.  Pt states he does not want to proceed with obtaining sleep apnea DME.  Pt states he has already had sleep apnea test through pcp office.  Pt states he was not able to tolerate nor afford the CPAP offered (approximately $6000).  Pt does not wish for CM to assist with obtaining this DME. He prefers to continue to work with his pcp for this outpatient DME if he decides he wants it in the future.  Pt appreciative of CM attempt to provide services but declined assistance.

## 2011-10-10 NOTE — H&P (Signed)
History and Physical Examination  Date: 10/10/2011  Patient name: [name] Medical record number: [id number] Date of birth: 12-14-1945 Age: 66 y.o. Gender: male PCP: Irven Shelling, MD, MD  Attending physician: Charlynne Cousins, MD  Chief Complaint:  Chief Complaint  Patient presents with  . Headache  . Dizziness  . Urinary Tract Infection     History of Present Illness: [name] is an 66 y.o. 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  He says that the episodes have progessed in the last 2 days where he becomes lightheaded and confused. He has nausea and blurry vision.  He had recently seen a urologist (Dr. Alinda Money) for a problem with bladder spasms related to his indwelling foley catheter.  He was started on ditropan.  He had taken it for a week and it was helping with the painful bladder spasms.   He says that he noticed his urine getting darker and blurry vision, higher blood sugars in last 2 days.  He denies CP and weakness.  He has OSA (not treated because of high equipment costs).  He is reporting painful neuropathy in legs and feet. He was found in ER to have UTI and started on cipro.  Hospital admission was requested so that he could be further monitored and evaluated.    Past Medical History Past Medical History  Diagnosis Date  . Obese   . Hypertension   . Diabetes mellitus   . Diabetic diarrhea   . Sleep apnea   . Diabetic neuropathy, painful   . Chronic indwelling foley catheter   . Bladder spasms        Chronic loose stools  Past Surgical History History reviewed. No pertinent past surgical history.  Home Meds: Prior to Admission medications   Medication Sig Start Date End Date Taking? Authorizing Provider  diphenoxylate-atropine (LOMOTIL) 2.5-0.025 MG per tablet Take 1 tablet by mouth 4 (four) times daily as needed. Diarrhea   Yes  Historical Provider, MD  glimepiride (AMARYL) 4 MG tablet Take 4 mg by mouth 2 (two) times daily.   Yes Historical Provider, MD  insulin NPH-insulin regular (NOVOLIN 70/30) (70-30) 100 UNIT/ML injection Inject 55 Units into the skin 2 (two) times daily with a meal.   Yes Historical Provider, MD  Melatonin 3 MG CAPS Take 3 mg by mouth at bedtime as needed. Insomnia   Yes Historical Provider, MD  omeprazole (PRILOSEC) 20 MG capsule Take 20 mg by mouth daily.   Yes Historical Provider, MD  oxybutynin (DITROPAN) 5 MG tablet Take 5 mg by mouth 3 (three) times daily.   Yes Historical Provider, MD  ramipril (ALTACE) 10 MG capsule Take 10 mg by mouth daily.   Yes Historical Provider, MD    Allergies: Cefadroxil; Cephalexin; Lipitor; Metformin and related; Morphine and related; and Robaxin  Social History:  History   Social History  . Marital Status: Single    Spouse Name: N/A    Number of Children: N/A  . Years of Education: N/A   Occupational History  . Not on file.   Social History Main Topics  . Smoking status: Never Smoker   . Smokeless tobacco: Never Used  . Alcohol Use: No  . Drug Use: No  . Sexually Active: No   Other Topics Concern  . Not on file   Social History Narrative  . No narrative on file  Family History: History reviewed. No pertinent family history.  Review of Systems: Pertinent items are noted in HPI. All other systems reviewed and reported as negative.   Physical Exam: Blood pressure 138/58, pulse 71, temperature 97.8 F (36.6 C), temperature source Oral, resp. rate 16, SpO2 100.00%. Head: Normocephalic, without obvious abnormality, atraumatic Eyes: negative Nose: Nares normal. Septum midline. Mucosa normal. No drainage or sinus tenderness., no discharge Throat: dry mucus membranes Neck: no adenopathy, no carotid bruit, no JVD, supple, symmetrical, trachea midline and thyroid not enlarged, symmetric, no tenderness/mass/nodules Lungs: shallow BS  bilateral but clear Heart: S1, S2 normal Abdomen: obese, soft, suprapubic TTP, no guarding; bowel sounds normal; no masses,  no organomegaly Male genitalia: foley catheter in place Extremities: chronic venous stasis changes and edema bilateral lower extremities Skin: venous stasis dermatitis Neurologic: no focal deficits, painful diabetic neuropathy  Lab  And Imaging results:  Results for orders placed during the hospital encounter of 10/10/11 (from the past 24 hour(s))  URINALYSIS, ROUTINE W REFLEX MICROSCOPIC     Status: Abnormal   Collection Time   10/10/11  2:53 AM      Component Value Range   Color, Urine YELLOW  YELLOW    APPearance CLOUDY (*) CLEAR    Specific Gravity, Urine 1.034 (*) 1.005 - 1.030    pH 7.0  5.0 - 8.0    Glucose, UA >1000 (*) NEGATIVE (mg/dL)   Hgb urine dipstick MODERATE (*) NEGATIVE    Bilirubin Urine NEGATIVE  NEGATIVE    Ketones, ur 40 (*) NEGATIVE (mg/dL)   Protein, ur 100 (*) NEGATIVE (mg/dL)   Urobilinogen, UA 1.0  0.0 - 1.0 (mg/dL)   Nitrite POSITIVE (*) NEGATIVE    Leukocytes, UA SMALL (*) NEGATIVE   URINE RAPID DRUG SCREEN (HOSP PERFORMED)     Status: Normal   Collection Time   10/10/11  2:53 AM      Component Value Range   Opiates NONE DETECTED  NONE DETECTED    Cocaine NONE DETECTED  NONE DETECTED    Benzodiazepines NONE DETECTED  NONE DETECTED    Amphetamines NONE DETECTED  NONE DETECTED    Tetrahydrocannabinol NONE DETECTED  NONE DETECTED    Barbiturates NONE DETECTED  NONE DETECTED   URINE MICROSCOPIC-ADD ON     Status: Abnormal   Collection Time   10/10/11  2:53 AM      Component Value Range   WBC, UA TOO NUMEROUS TO COUNT  <3 (WBC/hpf)   RBC / HPF 11-20  <3 (RBC/hpf)   Bacteria, UA MANY (*) RARE    Urine-Other AMORPHOUS URATES/PHOSPHATES    CBC     Status: Normal   Collection Time   10/10/11  3:00 AM      Component Value Range   WBC 9.1  4.0 - 10.5 (K/uL)   RBC 4.56  4.22 - 5.81 (MIL/uL)   Hemoglobin 13.6  13.0 - 17.0 (g/dL)    HCT 39.3  39.0 - 52.0 (%)   MCV 86.2  78.0 - 100.0 (fL)   MCH 29.8  26.0 - 34.0 (pg)   MCHC 34.6  30.0 - 36.0 (g/dL)   RDW 13.5  11.5 - 15.5 (%)   Platelets 218  150 - 400 (K/uL)  DIFFERENTIAL     Status: Abnormal   Collection Time   10/10/11  3:00 AM      Component Value Range   Neutrophils Relative 82 (*) 43 - 77 (%)   Neutro Abs 7.4  1.7 - 7.7 (  K/uL)   Lymphocytes Relative 13  12 - 46 (%)   Lymphs Abs 1.1  0.7 - 4.0 (K/uL)   Monocytes Relative 5  3 - 12 (%)   Monocytes Absolute 0.4  0.1 - 1.0 (K/uL)   Eosinophils Relative 1  0 - 5 (%)   Eosinophils Absolute 0.1  0.0 - 0.7 (K/uL)   Basophils Relative 0  0 - 1 (%)   Basophils Absolute 0.0  0.0 - 0.1 (K/uL)  COMPREHENSIVE METABOLIC PANEL     Status: Abnormal   Collection Time   10/10/11  3:00 AM      Component Value Range   Sodium 134 (*) 135 - 145 (mEq/L)   Potassium 3.9  3.5 - 5.1 (mEq/L)   Chloride 98  96 - 112 (mEq/L)   CO2 25  19 - 32 (mEq/L)   Glucose, Bld 277 (*) 70 - 99 (mg/dL)   BUN 11  6 - 23 (mg/dL)   Creatinine, Ser 0.61  0.50 - 1.35 (mg/dL)   Calcium 8.8  8.4 - 10.5 (mg/dL)   Total Protein 6.7  6.0 - 8.3 (g/dL)   Albumin 3.4 (*) 3.5 - 5.2 (g/dL)   AST 18  0 - 37 (U/L)   ALT 24  0 - 53 (U/L)   Alkaline Phosphatase 84  39 - 117 (U/L)   Total Bilirubin 0.5  0.3 - 1.2 (mg/dL)   GFR calc non Af Amer >90  >90 (mL/min)   GFR calc Af Amer >90  >90 (mL/min)  PROTIME-INR     Status: Normal   Collection Time   10/10/11  3:00 AM      Component Value Range   Prothrombin Time 13.7  11.6 - 15.2 (seconds)   INR 1.03  0.00 - 1.49   APTT     Status: Normal   Collection Time   10/10/11  3:00 AM      Component Value Range   aPTT 29  24 - 37 (seconds)  ETHANOL     Status: Normal   Collection Time   10/10/11  3:00 AM      Component Value Range   Alcohol, Ethyl (B) <11  0 - 11 (mg/dL)  ACETAMINOPHEN LEVEL     Status: Normal   Collection Time   10/10/11  3:00 AM      Component Value Range   Acetaminophen (Tylenol), Serum  <15.0  10 - 30 (ug/mL)  SALICYLATE LEVEL     Status: Abnormal   Collection Time   10/10/11  3:00 AM      Component Value Range   Salicylate Lvl 123456 (*) 2.8 - 20.0 (mg/dL)  GLUCOSE, CAPILLARY     Status: Abnormal   Collection Time   10/10/11  5:10 AM      Component Value Range   Glucose-Capillary 272 (*) 70 - 99 (mg/dL)  GLUCOSE, CAPILLARY     Status: Abnormal   Collection Time   10/10/11  8:55 AM      Component Value Range   Glucose-Capillary 252 (*) 70 - 99 (mg/dL)   EKG Results:  Orders placed during the hospital encounter of 10/10/11  . ED EKG  . ED EKG     Impression   UTI (lower urinary tract infection)  DM (diabetes mellitus) with complications  Polyneuropathy in diabetes  OSA (obstructive sleep apnea)  Diabetic neuropathy, painful  Painful bladder spasm  Dehydration  Hyperglycemia without ketosis  Generalized headaches  Hypertension  Chronic venous stasis - bilateral lower  extremities  Plan  Admit for IV antibiotics, follow urine and blood cultures, gently hydrate with IVFs.  Monitor and treat blood glucose.  DC sulfonylureas while inpatient, consider basal bolus approach to inpatient glycemic control. Resume ditropan.  Cycle cardiac enzymes and do neuro checks.  Monitor on telemetry.  Monitor I/Os.  Social Services Consult for assistance with sleep apnea equipment.  Please see orders.   [place], [place] 10/10/2011, 10:11 AM

## 2011-10-10 NOTE — Progress Notes (Signed)
  Echocardiogram 2D Echocardiogram has been performed.  [name] 10/10/2011, 3:00 PM

## 2011-10-10 NOTE — ED Notes (Signed)
Sitting up on carrier--awaiting transfer to room---Bed side commode placed at pt's bedside per his request.  Report given to p.m. shift

## 2011-10-10 NOTE — ED Notes (Signed)
C/o sudden onset of nausea--appears pale

## 2011-10-10 NOTE — Progress Notes (Signed)
ANTIBIOTIC CONSULT NOTE - INITIAL  Pharmacy Consult for Cipro Indication: UTI  Allergies  Allergen Reactions  . Cefadroxil   . Cephalexin   . Lipitor (Atorvastatin Calcium)   . Metformin And Related   . Morphine And Related   . Robaxin     Patient Measurements: Height: 6\' 1"  (185.4 cm) Weight: 409 lb (185.521 kg) IBW/kg (Calculated) : 79.9    Vital Signs: Temp: 97.8 F (36.6 C) (01/30 0736) Temp src: Oral (01/30 0736) BP: 138/58 mmHg (01/30 0736) Pulse Rate: 71  (01/30 0736) Intake/Output from previous day: 01/29 0701 - 01/30 0700 In: -  Out: 700 [Urine:700] Intake/Output from this shift: Total I/O In: -  Out: 1700 [Urine:1700]  Labs:  Basename 10/10/11 0300  WBC 9.1  HGB 13.6  PLT 218  LABCREA --  CREATININE 0.61   Estimated Creatinine Clearance: 163.2 ml/min (by C-G formula based on Cr of 0.61).   Microbiology: 1/30 Urine: pending  Medical History: Past Medical History  Diagnosis Date  . Obese   . Hypertension   . Diabetes mellitus   . Diabetic diarrhea   . Sleep apnea   . Diabetic neuropathy, painful   . Chronic indwelling foley catheter   . Bladder spasms     Medications:  Scheduled:    . ciprofloxacin  400 mg Intravenous Once  . enoxaparin (LOVENOX) injection  90 mg Subcutaneous Q24H  . insulin aspart  0-20 Units Subcutaneous TID WC  . insulin aspart  6 Units Subcutaneous TID WC  . insulin glargine  20 Units Subcutaneous QHS  . ondansetron      . oxybutynin  5 mg Oral TID  . pantoprazole  40 mg Oral Q0600  . ramipril  10 mg Oral Daily  . DISCONTD: enoxaparin  40 mg Subcutaneous Q24H   Infusions:    . sodium chloride     Assessment:  66 yo male presents with presumed UTI  Urine cultures pending  Normal renal function  Goal of Therapy:  Eradication of infection  Plan:   Cipro 400mg  IV q12h  Follow renal function and adjust as needed  Change to po when appropriate  Peggyann Juba, PharmD, BCPS Pager:  [phone] 10/10/2011,11:05 AM

## 2011-10-10 NOTE — ED Notes (Signed)
[id number] Expected date:10/10/11<BR> Expected time:12:15 AM<BR> Means of arrival:Ambulance<BR> Comments:<BR> Headache, dizzy

## 2011-10-10 NOTE — ED Notes (Signed)
Sprite zero x 2 given to patient per request.

## 2011-10-11 LAB — COMPREHENSIVE METABOLIC PANEL
AST: 16 U/L (ref 0–37)
BUN: 13 mg/dL (ref 6–23)
CO2: 27 mEq/L (ref 19–32)
Calcium: 8.9 mg/dL (ref 8.4–10.5)
Creatinine, Ser: 0.78 mg/dL (ref 0.50–1.35)
GFR calc Af Amer: >90 mL/min (ref >90)
GFR calc non Af Amer: >90 mL/min (ref >90)

## 2011-10-11 LAB — GLUCOSE, CAPILLARY
Glucose-Capillary: 254 mg/dL — ABNORMAL HIGH (ref 70–99)
Glucose-Capillary: 226 mg/dL — ABNORMAL HIGH (ref 70–99)
Glucose-Capillary: 248 mg/dL — ABNORMAL HIGH (ref 70–99)

## 2011-10-11 LAB — CARDIAC PANEL(CRET KIN+CKTOT+MB+TROPI)
Relative Index: 3.6 — ABNORMAL HIGH (ref 0.0–2.5)
Relative Index: 3.5 — ABNORMAL HIGH (ref 0.0–2.5)
Total CK: 119 U/L (ref 7–232)
Total CK: 118 U/L (ref 7–232)

## 2011-10-11 MED ORDER — GLIMEPIRIDE 4 MG PO TABS
4.0000 mg | ORAL_TABLET | Freq: Every day | ORAL | Status: DC
  Administered 2011-10-11 – 2011-10-14 (×4): 4 mg via ORAL
  Filled 2011-10-11 (×5): qty 1

## 2011-10-11 NOTE — Progress Notes (Signed)
Diabetic handouts on meal planning and Type 2 given to pt per MD order.  [name]

## 2011-10-11 NOTE — Progress Notes (Signed)
Physical Therapy Note  Order received. Chart reviewed. Attempted PT evaluation this p.m. Pt refused to participate/get OOB. Pt stated " I believe I'll turn it down altogether." PT will sign off at this time. Please re-order if/when pt agrees to participate. Thanks.

## 2011-10-11 NOTE — Progress Notes (Signed)
Pt 2nd set of cardiac enzyme results aren't showing up in EPIC. I did witness the phlebotomist come draw the 2nd set, however the results aren't in the computer. The first and 3rd sets are showing in EPIC. Spoke with NP on call, ordered to place another order for another cardiac panel to be drawn at 1000 this am.

## 2011-10-11 NOTE — Progress Notes (Signed)
Results for HENDRIK, KOR (MRN [id number]) as of 10/11/2011 13:03  Ref. Range 10/10/2011 08:55 10/10/2011 12:45 10/10/2011 16:23 10/10/2011 20:04 10/10/2011 21:17 10/11/2011 07:22 10/11/2011 11:17  Glucose-Capillary Latest Range: 70-99 mg/dL 252 (H) 275 (H) 344 (H) 165 (H) 201 (H) 254 (H) 226 (H)   Noted pt started on Lantus 20 units QHS last evening along with Resistant SSI plus 6 units Novolog meal coverage.  Noted pt started on PO Amaryl this morning.  Home medications for DM include: Amaryl 4 mg bid 70/30 insulin- 55 units bid with meals  Based on current CBGs, pt will likely need more basal insulin (Lantus).  Could increase Lantus or switch pt back to home insulin of 70/30.  Pt is currently eating 100% of meals.  Recommend the following: Increase Lantus or Restart home 70/30 insulin.  Will follow. Wyn Quaker RN, MSN, CDE Diabetes Coordinator Inpatient Diabetes Program [phone]

## 2011-10-11 NOTE — Progress Notes (Signed)
Subjective: Feels much better  Objective: Vital signs in last 24 hours: Temp:  [97.7 F (36.5 C)-98.6 F (37 C)] 97.9 F (36.6 C) (01/31 0448) Pulse Rate:  [75-82] 75  (01/31 0448) Resp:  [17-21] 20  (01/31 0448) BP: (142-157)/(76-85) 152/83 mmHg (01/31 0448) SpO2:  [97 %-98 %] 97 % (01/31 0448) Weight:  [176.6 kg (389 lb 5.3 oz)-185.521 kg (409 lb)] 176.6 kg (389 lb 5.3 oz) (01/30 1212) Weight change:     Intake/Output from previous day: 01/30 0701 - 01/31 0700 In: 1163.8 [P.O.:540; I.V.:423.8; IV Piggyback:200] Out: 4700 [Urine:4700] Intake/Output this shift:    General appearance: alert Resp: clear to auscultation bilaterally Cardio: regular rate and rhythm, S1, S2 normal, no murmur, click, rub or gallop Extremities: edema trace  Lab Results:  Basename 10/10/11 1055 10/10/11 0300  WBC 8.9 9.1  HGB 14.5 13.6  HCT 41.4 39.3  PLT 231 218   BMET  Basename 10/11/11 0218 10/10/11 1055 10/10/11 0300  NA 137 -- 134*  K 4.3 -- 3.9  CL 101 -- 98  CO2 27 -- 25  GLUCOSE 291* -- 277*  BUN 13 -- 11  CREATININE 0.78 0.56 --  CALCIUM 8.9 -- 8.8    Studies/Results: Ct Head Wo Contrast  10/10/2011  *RADIOLOGY REPORT*  Clinical Data: Headache and altered mental status.  CT HEAD WITHOUT CONTRAST  Technique:  Contiguous axial images were obtained from the base of the skull through the vertex without contrast.  Comparison: 08/25/2010  Findings: Diffuse cerebral atrophy.  Mild ventricular dilatation consistent with central atrophy.  Low attenuation changes in the deep white matter consistent with small vessel ischemic change. Focal areas of hyperintensity in the basal ganglia and thalami bilaterally are stable since the prior study and likely represent calcification.  No mass effect or midline shift.  No abnormal extra- axial fluid collections.  Gray-white matter junctions are distinct. Basal cisterns are not effaced.  No evidence of acute intracranial hemorrhage.  Opacification of  some of the ethmoid air cells. Paranasal sinuses are otherwise not opacified.  Mastoid air cells are not opacified.  No depressed skull fractures.  Stable appearance since previous study.  IMPRESSION: Chronic atrophy and small vessel ischemic changes.  A mild opacification of some ethmoid air cells.  No evidence of acute intracranial hemorrhage, mass lesion, or acute infarct.  Original Report Authenticated By: Neale Burly, M.D.   Dg Chest Port 1 View  10/10/2011  *RADIOLOGY REPORT*  Clinical Data: Chest pain and shortness of breath  PORTABLE CHEST - 1 VIEW  Comparison: 08/25/2010  Findings: Shallow inspiration with elevation of left hemidiaphragm. Heart size and pulmonary vascularity appear prominent.  Hazy perihilar interstitial opacities bilaterally suggesting mild edema or interstitial fibrosis.  Scattered calcified granulomas in the lungs.  Calcification of the aorta.  No blunting of costophrenic angles.  No pneumothorax.  IMPRESSION: Mild cardiac enlargement with increased pulmonary vascularity and hazy opacities suggesting mild edema.  Original Report Authenticated By: Neale Burly, M.D.    Medications: I have reviewed the patient's current medications.  Assessment/Plan: Principal Problem:  *UTI (lower urinary tract infection)  Day 2 antibiotics, culture pending Active Problems:  DM (diabetes mellitus) with complications  SSI/Lantus.   Start amaryl  OSA (obstructive sleep apnea)  Has never agreed to workup or treatment  Painful bladder spasm  Dehydration  Taking po, Bun/Cr ok.  D/C IVFS  Hypertension follow   LOS: 1 day   [name] 10/11/2011, 7:44 AM

## 2011-10-11 NOTE — Progress Notes (Signed)
OT Note Order received, chart reviewed. Pt declined OT eval X 2 this am. Also defer to PT note below. Will sign off for now. Please re-order if/when pt is agreeable to participate.  Bernell List, OTR/L  Pager [phone] 10/11/2011

## 2011-10-12 LAB — GLUCOSE, CAPILLARY
Glucose-Capillary: 225 mg/dL — ABNORMAL HIGH (ref 70–99)
Glucose-Capillary: 303 mg/dL — ABNORMAL HIGH (ref 70–99)

## 2011-10-12 LAB — CBC
HCT: 38.5 % — ABNORMAL LOW (ref 39.0–52.0)
Hemoglobin: 12.9 g/dL — ABNORMAL LOW (ref 13.0–17.0)
RBC: 4.36 MIL/uL (ref 4.22–5.81)
WBC: 10.3 10*3/uL (ref 4.0–10.5)

## 2011-10-12 LAB — DIFFERENTIAL
Basophils Relative: 1 % (ref 0–1)
Lymphocytes Relative: 14 % (ref 12–46)
Monocytes Absolute: 1 10*3/uL (ref 0.1–1.0)
Monocytes Relative: 10 % (ref 3–12)
Neutro Abs: 7.6 10*3/uL (ref 1.7–7.7)
Neutrophils Relative %: 74 % (ref 43–77)

## 2011-10-12 LAB — BASIC METABOLIC PANEL
Chloride: 101 mEq/L (ref 96–112)
GFR calc Af Amer: >90 mL/min (ref >90)
GFR calc non Af Amer: >90 mL/min (ref >90)
Potassium: 3.7 mEq/L (ref 3.5–5.1)
Sodium: 135 mEq/L (ref 135–145)

## 2011-10-12 MED ORDER — TEMAZEPAM 15 MG PO CAPS
15.0000 mg | ORAL_CAPSULE | Freq: Every evening | ORAL | Status: DC | PRN
  Administered 2011-10-14: 15 mg via ORAL
  Filled 2011-10-12: qty 1

## 2011-10-12 MED ORDER — POTASSIUM CHLORIDE 20 MEQ/15ML (10%) PO LIQD
20.0000 meq | Freq: Every day | ORAL | Status: DC
  Administered 2011-10-12 – 2011-10-13 (×2): 20 meq via ORAL
  Filled 2011-10-12 (×3): qty 15

## 2011-10-12 MED ORDER — FUROSEMIDE 40 MG PO TABS
40.0000 mg | ORAL_TABLET | Freq: Every day | ORAL | Status: DC
  Administered 2011-10-12 – 2011-10-13 (×2): 40 mg via ORAL
  Filled 2011-10-12 (×3): qty 1

## 2011-10-12 MED ORDER — INSULIN GLARGINE 100 UNIT/ML ~~LOC~~ SOLN
50.0000 [IU] | Freq: Every day | SUBCUTANEOUS | Status: DC
  Administered 2011-10-12 – 2011-10-13 (×2): 50 [IU] via SUBCUTANEOUS

## 2011-10-12 MED ORDER — ASPIRIN 81 MG PO CHEW
81.0000 mg | CHEWABLE_TABLET | Freq: Every day | ORAL | Status: DC
  Administered 2011-10-12 – 2011-10-13 (×2): 81 mg via ORAL
  Filled 2011-10-12 (×3): qty 1

## 2011-10-12 MED ORDER — INFLUENZA VIRUS VACC SPLIT PF IM SUSP
0.5000 mL | INTRAMUSCULAR | Status: AC
  Administered 2011-10-13: 0.5 mL via INTRAMUSCULAR
  Filled 2011-10-12: qty 0.5

## 2011-10-12 NOTE — Progress Notes (Signed)
Unable to ambulate pt today due to pt condition.  RN tried to ambulate pt five times today, however, every time the pt was either too nauseated or continuously going to the bedside commode.

## 2011-10-12 NOTE — Progress Notes (Signed)
Subjective: Some nausea and vomited yesterday, tolerated full liquids last night.  No abd pain, passing gas.  Objective: Vital signs in last 24 hours: Temp:  [97.6 F (36.4 C)-98.2 F (36.8 C)] 98.2 F (36.8 C) (02/01 0515) Pulse Rate:  [70-80] 80  (02/01 0515) Resp:  [20-22] 20  (02/01 0515) BP: (133-158)/(75-85) 158/85 mmHg (02/01 0515) SpO2:  [91 %-96 %] 96 % (02/01 0515) Weight change:     Intake/Output from previous day: 01/31 0701 - 02/01 0700 In: 2811 [P.O.:2405; IV Piggyback:406] Out: 5478 [Urine:5475; Emesis/NG output:3] Intake/Output this shift:    General appearance: alert and cooperative Resp: clear to auscultation bilaterally Cardio: regular rate and rhythm, S1, S2 normal, no murmur, click, rub or gallop GI: soft, non-tender; bowel sounds normal; no masses,  no organomegaly Extremities: edema trace  Lab Results:  Basename 10/12/11 0359 10/10/11 1055  WBC 10.3 8.9  HGB 12.9* 14.5  HCT 38.5* 41.4  PLT 208 231   BMET  Basename 10/12/11 0359 10/11/11 0218  NA 135 137  K 3.7 4.3  CL 101 101  CO2 26 27  GLUCOSE 202* 291*  BUN 9 13  CREATININE 0.60 0.78  CALCIUM 8.7 8.9    Studies/Results: No results found.  Medications: I have reviewed the patient's current medications.  Assessment/Plan: Principal Problem:  *UTI (lower urinary tract infection) day 3 iv cipro, waiting urine culture, change to po tomorrow Active Problems:  DM (diabetes mellitus) with complications not well controlled,  Increase lantus to 50U daily, takes 70/30 as outpt  Painful bladder spasm improved. On oxybutinin  Hypertension  follow  N/V  Retry carb modified diet today  Disposition  Refused PT/OT, ambulate by staff today, possible d/c over weekend vs early next week    LOS: 2 days   Joshalyn Ancheta JOSEPH 10/12/2011, 7:05 AM

## 2011-10-13 LAB — GLUCOSE, CAPILLARY
Glucose-Capillary: 244 mg/dL — ABNORMAL HIGH (ref 70–99)
Glucose-Capillary: 240 mg/dL — ABNORMAL HIGH (ref 70–99)

## 2011-10-13 MED ORDER — CIPROFLOXACIN HCL 500 MG PO TABS
500.0000 mg | ORAL_TABLET | Freq: Two times a day (BID) | ORAL | Status: DC
  Administered 2011-10-13 – 2011-10-14 (×3): 500 mg via ORAL
  Filled 2011-10-13 (×4): qty 1

## 2011-10-13 NOTE — Progress Notes (Signed)
Subjective: [name] is still having some nausea. We'll be switching to oral Cipro. Perhaps his nausea is from the IV Cipro. Otherwise feeling better  Objective: Weight change:   Intake/Output Summary (Last 24 hours) at 10/13/11 0845 Last data filed at 10/13/11 0534  Gross per 24 hour  Intake    560 ml  Output   3000 ml  Net  -2440 ml    Filed Vitals:   10/12/11 0515 10/12/11 1429 10/12/11 2217 10/13/11 0530  BP: 158/85 138/83 150/82 158/88  Pulse: 80 97 83 79  Temp: 98.2 F (36.8 C) 98.1 F (36.7 C) 98 F (36.7 C) 98.6 F (37 C)  TempSrc: Oral  Oral Oral  Resp: 20 23 20 19   Height:      Weight:      SpO2: 96% 94% 96% 96%    General appearance: alert and cooperative  Resp: clear to auscultation bilaterally  Cardio: regular rate and rhythm, S1, S2 normal, no murmur, click, rub or gallop  GI: soft, obese, non-tender; bowel sounds normal; no masses, no organomegaly  Extremities: No edema. Hyperpigmented changes of her lower extremities   Lab Results:  [facility] 10/12/11 0359 10/11/11 0218  NA 135 137  K 3.7 4.3  CL 101 101  CO2 26 27  GLUCOSE 202* 291*  BUN 9 13  CREATININE 0.60 0.78  CALCIUM 8.7 8.9  MG -- --  PHOS -- --    Basename 10/11/11 0218  AST 16  ALT 22  ALKPHOS 78  BILITOT 0.3  PROT 6.5  ALBUMIN 3.1*   No results found for this basename: LIPASE:2,AMYLASE:2 in the last 72 hours  Basename 10/12/11 0359 10/10/11 1055  WBC 10.3 8.9  NEUTROABS 7.6 --  HGB 12.9* 14.5  HCT 38.5* 41.4  MCV 88.3 86.6  PLT 208 231    Basename 10/11/11 0950 10/11/11 0218 10/10/11 1055  CKTOTAL 118 119 57  CKMB 4.1* 4.3* 2.6  CKMBINDEX -- -- --  TROPONINI <0.30 <0.30 <0.30   No components found with this basename: POCBNP:3 No results found for this basename: DDIMER:2 in the last 72 hours  Basename 10/10/11 1055  HGBA1C 10.1*   No results found for this basename: CHOL:2,HDL:2,LDLCALC:2,TRIG:2,CHOLHDL:2,LDLDIRECT:2 in the last 72 hours No results found  for this basename: TSH,T4TOTAL,FREET3,T3FREE,THYROIDAB in the last 72 hours No results found for this basename: VITAMINB12:2,FOLATE:2,FERRITIN:2,TIBC:2,IRON:2,RETICCTPCT:2 in the last 72 hours  Studies/Results: No results found. Medications: Scheduled Meds:   . aspirin  81 mg Oral Daily  . ciprofloxacin  500 mg Oral BID  . enoxaparin (LOVENOX) injection  90 mg Subcutaneous Q24H  . furosemide  40 mg Oral Daily  . glimepiride  4 mg Oral Q breakfast  . influenza  inactive virus vaccine  0.5 mL Intramuscular Tomorrow-1000  . influenza  inactive virus vaccine  0.5 mL Intramuscular Tomorrow-1000  . insulin aspart  0-20 Units Subcutaneous TID WC  . insulin aspart  6 Units Subcutaneous TID WC  . insulin glargine  50 Units Subcutaneous QHS  . oxybutynin  5 mg Oral TID  . pantoprazole  40 mg Oral Q0600  . potassium chloride  20 mEq Oral Daily  . ramipril  10 mg Oral Daily  . DISCONTD: ciprofloxacin  400 mg Intravenous Q12H   Continuous Infusions:  PRN Meds:.acetaminophen, acetaminophen, diphenoxylate-atropine, ondansetron (ZOFRAN) IV, ondansetron, promethazine, temazepam  Assessment/Plan:  Principal Problem:  *UTI (lower urinary tract infection) day 4 iv cipro, waiting urine culture - greater than 100,000 gram-negative rods, change to po Cipro  Active Problems: Cardiac enzymes normal  DM (diabetes mellitus) with complications not well controlled, continue lantus 50U daily, takes 70/30 as outpt  Painful bladder spasm improved. On oxybutinin  Hypertension follow  N/V Retry carb modified diet today  Disposition Refused PT/OT, ambulate by staff today, possible d/c tomorrow or the next day   LOS: 3 days   [name] NEVILL 10/13/2011, 8:45 AM

## 2011-10-13 NOTE — Progress Notes (Signed)
10/13/11 Ear wax removal order entered in error.

## 2011-10-14 LAB — URINE CULTURE: Culture  Setup Time: 201301301115

## 2011-10-14 LAB — BASIC METABOLIC PANEL
CO2: 26 mEq/L (ref 19–32)
Chloride: 98 mEq/L (ref 96–112)
Glucose, Bld: 216 mg/dL — ABNORMAL HIGH (ref 70–99)
Sodium: 134 mEq/L — ABNORMAL LOW (ref 135–145)

## 2011-10-14 LAB — DIFFERENTIAL
Basophils Absolute: 0.1 10*3/uL (ref 0.0–0.1)
Lymphocytes Relative: 16 % (ref 12–46)
Lymphs Abs: 1.3 10*3/uL (ref 0.7–4.0)
Monocytes Absolute: 0.7 10*3/uL (ref 0.1–1.0)
Neutro Abs: 5.6 10*3/uL (ref 1.7–7.7)

## 2011-10-14 LAB — CBC
HCT: 38.6 % — ABNORMAL LOW (ref 39.0–52.0)
MCV: 87.1 fL (ref 78.0–100.0)
Platelets: 206 10*3/uL (ref 150–400)
RBC: 4.43 MIL/uL (ref 4.22–5.81)
RDW: 13.2 % (ref 11.5–15.5)
WBC: 7.9 10*3/uL (ref 4.0–10.5)

## 2011-10-14 LAB — GLUCOSE, CAPILLARY: Glucose-Capillary: 201 mg/dL — ABNORMAL HIGH (ref 70–99)

## 2011-10-14 MED ORDER — CIPROFLOXACIN HCL 500 MG PO TABS: 500.0000 mg | ORAL_TABLET | Freq: Two times a day (BID) | ORAL | Status: DC

## 2011-10-14 NOTE — Discharge Summary (Signed)
Physician Discharge Summary  NAME:[name]  [id number]  DOB: 31-May-1946   Admit date: 10/10/2011 Discharge date: 10/14/2011  Discharge Diagnoses:  Principal Problem: UTI (lower urinary tract infection) - responding to ciprofloxacin. Gram-negative rod. Followup culture as outpatient. Patient much improved Active Problems:  DM (diabetes mellitus) with complications - continue outpatient diabetic therapy  Painful bladder spasm - continue oxybutynin  Hypertension - controlled   Discharge Condition: Improved  Hospital Course: [name] is a pleasant 66 year old male with morbid obesity, type 2 diabetes mellitus, insulin requiring, hypertension and chronic indwelling Foley catheter. He has a history of urinary tract infections. He presented on January 30 to the [facility] emergency department with complaint of 2 days of lightheadedness and confusion and nausea and blurred vision. He had been having ongoing problems with bladder spasms. Followed by Dr. Raynelle Bring. He was found to have a UTI in the emergency room and started on IV ciprofloxacin. He had nausea the first several days of admission which seemed to resolve with stopping IV Cipro. He is no longer nauseous this morning and is requesting discharge which is reasonable. He has responded well to Cipro in terms of his UTI. Will continue as an outpatient for 7 days. He has a followup appointment with [name] within one week by history.  Filed Vitals:   10/13/11 0530 10/13/11 1415 10/13/11 2134 10/14/11 0526  BP: 158/88 147/76 154/76 158/77  Pulse: 79 82 77 73  Temp: 98.6 F (37 C) 98.5 F (36.9 C) 98 F (36.7 C) 98 F (36.7 C)  TempSrc: Oral Oral Oral Oral  Resp: 19 20 20 20   Height:      Weight:      SpO2: 96% 95% 94% 92%   General appearance: alert and cooperative  Resp: clear to auscultation bilaterally  Cardio: regular rate and rhythm, S1, S2 normal, no murmur, click, rub or gallop  GI: soft, obese,  non-tender; bowel sounds normal; no masses, no organomegaly GU:  Indwelling Foley catheter  Extremities: No edema. Hyperpigmented changes of her lower extremities.    Consults: Noncontributory  Disposition: Home or Self Care  Discharge Orders    Future Orders Please Complete By Expires   Diet - low sodium heart healthy      Increase activity slowly      Call MD for:  temperature >100.4      Call MD for:  persistant nausea and vomiting      Call MD for:  difficulty breathing, headache or visual disturbances      Call MD for:  hives        Medication List  As of 10/14/2011  8:34 AM   TAKE these medications         ciprofloxacin 500 MG tablet   Commonly known as: CIPRO   Take 1 tablet (500 mg total) by mouth 2 (two) times daily.      diphenoxylate-atropine 2.5-0.025 MG per tablet   Commonly known as: LOMOTIL   Take 1 tablet by mouth 4 (four) times daily as needed. Diarrhea      glimepiride 4 MG tablet   Commonly known as: AMARYL   Take 4 mg by mouth 2 (two) times daily.      insulin NPH-insulin regular (70-30) 100 UNIT/ML injection   Commonly known as: NOVOLIN 70/30   Inject 55 Units into the skin 2 (two) times daily with a meal.      Melatonin 3 MG Caps   Take 3 mg by  mouth at bedtime as needed. Insomnia      omeprazole 20 MG capsule   Commonly known as: PRILOSEC   Take 20 mg by mouth daily.      oxybutynin 5 MG tablet   Commonly known as: DITROPAN   Take 5 mg by mouth 3 (three) times daily.      ramipril 10 MG capsule   Commonly known as: ALTACE   Take 10 mg by mouth daily.           Follow-up Information    Follow up with Irven Shelling, MD .         Things to follow up in the outpatient setting: Notify physician if nausea reoccurs or if fever or chills or lightheadedness or dizziness or visual difficulties recur.  Time coordinating discharge: 25 minutes  The results of significant diagnostics from this hospitalization (including imaging,  microbiology, ancillary and laboratory) are listed below for reference.    Significant Diagnostic Studies: Ct Head Wo Contrast  10/10/2011  *RADIOLOGY REPORT*  Clinical Data: Headache and altered mental status.  CT HEAD WITHOUT CONTRAST  Technique:  Contiguous axial images were obtained from the base of the skull through the vertex without contrast.  Comparison: 08/25/2010  Findings: Diffuse cerebral atrophy.  Mild ventricular dilatation consistent with central atrophy.  Low attenuation changes in the deep white matter consistent with small vessel ischemic change. Focal areas of hyperintensity in the basal ganglia and thalami bilaterally are stable since the prior study and likely represent calcification.  No mass effect or midline shift.  No abnormal extra- axial fluid collections.  Gray-white matter junctions are distinct. Basal cisterns are not effaced.  No evidence of acute intracranial hemorrhage.  Opacification of some of the ethmoid air cells. Paranasal sinuses are otherwise not opacified.  Mastoid air cells are not opacified.  No depressed skull fractures.  Stable appearance since previous study.  IMPRESSION: Chronic atrophy and small vessel ischemic changes.  A mild opacification of some ethmoid air cells.  No evidence of acute intracranial hemorrhage, mass lesion, or acute infarct.  Original Report Authenticated By: Neale Burly, M.D.   Dg Chest Port 1 View  10/10/2011  *RADIOLOGY REPORT*  Clinical Data: Chest pain and shortness of breath  PORTABLE CHEST - 1 VIEW  Comparison: 08/25/2010  Findings: Shallow inspiration with elevation of left hemidiaphragm. Heart size and pulmonary vascularity appear prominent.  Hazy perihilar interstitial opacities bilaterally suggesting mild edema or interstitial fibrosis.  Scattered calcified granulomas in the lungs.  Calcification of the aorta.  No blunting of costophrenic angles.  No pneumothorax.  IMPRESSION: Mild cardiac enlargement with increased  pulmonary vascularity and hazy opacities suggesting mild edema.  Original Report Authenticated By: Neale Burly, M.D.    Microbiology: Recent Results (from the past 240 hour(s))  URINE CULTURE     Status: Normal (Preliminary result)   Collection Time   10/10/11  2:53 AM      Component Value Range Status Comment   Specimen Description URINE, CLEAN CATCH   Final    Special Requests NONE   Final    Culture  Setup Time [id number]   Final    Colony Count >=100,000 COLONIES/ML   Final    Culture GRAM NEGATIVE RODS   Final    Report Status PENDING   Incomplete      Labs: Results for orders placed during the hospital encounter of 10/10/11  CBC      Component Value Range   WBC 9.1  4.0 - 10.5 (K/uL)   RBC 4.56  4.22 - 5.81 (MIL/uL)   Hemoglobin 13.6  13.0 - 17.0 (g/dL)   HCT 39.3  39.0 - 52.0 (%)   MCV 86.2  78.0 - 100.0 (fL)   MCH 29.8  26.0 - 34.0 (pg)   MCHC 34.6  30.0 - 36.0 (g/dL)   RDW 13.5  11.5 - 15.5 (%)   Platelets 218  150 - 400 (K/uL)  DIFFERENTIAL      Component Value Range   Neutrophils Relative 82 (*) 43 - 77 (%)   Neutro Abs 7.4  1.7 - 7.7 (K/uL)   Lymphocytes Relative 13  12 - 46 (%)   Lymphs Abs 1.1  0.7 - 4.0 (K/uL)   Monocytes Relative 5  3 - 12 (%)   Monocytes Absolute 0.4  0.1 - 1.0 (K/uL)   Eosinophils Relative 1  0 - 5 (%)   Eosinophils Absolute 0.1  0.0 - 0.7 (K/uL)   Basophils Relative 0  0 - 1 (%)   Basophils Absolute 0.0  0.0 - 0.1 (K/uL)  COMPREHENSIVE METABOLIC PANEL      Component Value Range   Sodium 134 (*) 135 - 145 (mEq/L)   Potassium 3.9  3.5 - 5.1 (mEq/L)   Chloride 98  96 - 112 (mEq/L)   CO2 25  19 - 32 (mEq/L)   Glucose, Bld 277 (*) 70 - 99 (mg/dL)   BUN 11  6 - 23 (mg/dL)   Creatinine, Ser 0.61  0.50 - 1.35 (mg/dL)   Calcium 8.8  8.4 - 10.5 (mg/dL)   Total Protein 6.7  6.0 - 8.3 (g/dL)   Albumin 3.4 (*) 3.5 - 5.2 (g/dL)   AST 18  0 - 37 (U/L)   ALT 24  0 - 53 (U/L)   Alkaline Phosphatase 84  39 - 117 (U/L)   Total  Bilirubin 0.5  0.3 - 1.2 (mg/dL)   GFR calc non Af Amer >90  >90 (mL/min)   GFR calc Af Amer >90  >90 (mL/min)  URINALYSIS, ROUTINE W REFLEX MICROSCOPIC      Component Value Range   Color, Urine YELLOW  YELLOW    APPearance CLOUDY (*) CLEAR    Specific Gravity, Urine 1.034 (*) 1.005 - 1.030    pH 7.0  5.0 - 8.0    Glucose, UA >1000 (*) NEGATIVE (mg/dL)   Hgb urine dipstick MODERATE (*) NEGATIVE    Bilirubin Urine NEGATIVE  NEGATIVE    Ketones, ur 40 (*) NEGATIVE (mg/dL)   Protein, ur 100 (*) NEGATIVE (mg/dL)   Urobilinogen, UA 1.0  0.0 - 1.0 (mg/dL)   Nitrite POSITIVE (*) NEGATIVE    Leukocytes, UA SMALL (*) NEGATIVE   PROTIME-INR      Component Value Range   Prothrombin Time 13.7  11.6 - 15.2 (seconds)   INR 1.03  0.00 - 1.49   APTT      Component Value Range   aPTT 29  24 - 37 (seconds)  ETHANOL      Component Value Range   Alcohol, Ethyl (B) <11  0 - 11 (mg/dL)  URINE RAPID DRUG SCREEN (HOSP PERFORMED)      Component Value Range   Opiates NONE DETECTED  NONE DETECTED    Cocaine NONE DETECTED  NONE DETECTED    Benzodiazepines NONE DETECTED  NONE DETECTED    Amphetamines NONE DETECTED  NONE DETECTED    Tetrahydrocannabinol NONE DETECTED  NONE DETECTED    Barbiturates NONE DETECTED  NONE DETECTED   ACETAMINOPHEN LEVEL      Component Value Range   Acetaminophen (Tylenol), Serum <15.0  10 - 30 (ug/mL)  SALICYLATE LEVEL      Component Value Range   Salicylate Lvl 123456 (*) 2.8 - 20.0 (mg/dL)  URINE MICROSCOPIC-ADD ON      Component Value Range   WBC, UA TOO NUMEROUS TO COUNT  <3 (WBC/hpf)   RBC / HPF 11-20  <3 (RBC/hpf)   Bacteria, UA MANY (*) RARE    Urine-Other AMORPHOUS URATES/PHOSPHATES    URINE CULTURE      Component Value Range   Specimen Description URINE, CLEAN CATCH     Special Requests NONE     Culture  Setup Time [id number]     Colony Count >=100,000 COLONIES/ML     Culture GRAM NEGATIVE RODS     Report Status PENDING    GLUCOSE, CAPILLARY       Component Value Range   Glucose-Capillary 272 (*) 70 - 99 (mg/dL)  GLUCOSE, CAPILLARY      Component Value Range   Glucose-Capillary 252 (*) 70 - 99 (mg/dL)  CARDIAC PANEL(CRET KIN+CKTOT+MB+TROPI)      Component Value Range   Total CK 57  7 - 232 (U/L)   CK, MB 2.6  0.3 - 4.0 (ng/mL)   Troponin I <0.30  <0.30 (ng/mL)   Relative Index RELATIVE INDEX IS INVALID  0.0 - 2.5   CBC      Component Value Range   WBC 8.9  4.0 - 10.5 (K/uL)   RBC 4.78  4.22 - 5.81 (MIL/uL)   Hemoglobin 14.5  13.0 - 17.0 (g/dL)   HCT 41.4  39.0 - 52.0 (%)   MCV 86.6  78.0 - 100.0 (fL)   MCH 30.3  26.0 - 34.0 (pg)   MCHC 35.0  30.0 - 36.0 (g/dL)   RDW 13.4  11.5 - 15.5 (%)   Platelets 231  150 - 400 (K/uL)  CREATININE, SERUM      Component Value Range   Creatinine, Ser 0.56  0.50 - 1.35 (mg/dL)   GFR calc non Af Amer >90  >90 (mL/min)   GFR calc Af Amer >90  >90 (mL/min)  HEMOGLOBIN A1C      Component Value Range   Hemoglobin A1C 10.1 (*) <5.7 (%)   Mean Plasma Glucose 243 (*) <117 (mg/dL)  GLUCOSE, CAPILLARY      Component Value Range   Glucose-Capillary 275 (*) 70 - 99 (mg/dL)  GLUCOSE, CAPILLARY      Component Value Range   Glucose-Capillary 252 (*) 70 - 99 (mg/dL)   Comment 1 Notify RN     Comment 2 Documented in Chart    CARDIAC PANEL(CRET KIN+CKTOT+MB+TROPI)      Component Value Range   Total CK 119  7 - 232 (U/L)   CK, MB 4.3 (*) 0.3 - 4.0 (ng/mL)   Troponin I <0.30  <0.30 (ng/mL)   Relative Index 3.6 (*) 0.0 - 2.5   COMPREHENSIVE METABOLIC PANEL      Component Value Range   Sodium 137  135 - 145 (mEq/L)   Potassium 4.3  3.5 - 5.1 (mEq/L)   Chloride 101  96 - 112 (mEq/L)   CO2 27  19 - 32 (mEq/L)   Glucose, Bld 291 (*) 70 - 99 (mg/dL)   BUN 13  6 - 23 (mg/dL)   Creatinine, Ser 0.78  0.50 - 1.35 (mg/dL)   Calcium 8.9  8.4 - 10.5 (mg/dL)  Total Protein 6.5  6.0 - 8.3 (g/dL)   Albumin 3.1 (*) 3.5 - 5.2 (g/dL)   AST 16  0 - 37 (U/L)   ALT 22  0 - 53 (U/L)   Alkaline Phosphatase 78   39 - 117 (U/L)   Total Bilirubin 0.3  0.3 - 1.2 (mg/dL)   GFR calc non Af Amer >90  >90 (mL/min)   GFR calc Af Amer >90  >90 (mL/min)  GLUCOSE, CAPILLARY      Component Value Range   Glucose-Capillary 344 (*) 70 - 99 (mg/dL)  GLUCOSE, CAPILLARY      Component Value Range   Glucose-Capillary 165 (*) 70 - 99 (mg/dL)  GLUCOSE, CAPILLARY      Component Value Range   Glucose-Capillary 201 (*) 70 - 99 (mg/dL)   Comment 1 Documented in Chart     Comment 2 Notify RN    CARDIAC PANEL(CRET KIN+CKTOT+MB+TROPI)      Component Value Range   Total CK 118  7 - 232 (U/L)   CK, MB 4.1 (*) 0.3 - 4.0 (ng/mL)   Troponin I <0.30  <0.30 (ng/mL)   Relative Index 3.5 (*) 0.0 - 2.5   GLUCOSE, CAPILLARY      Component Value Range   Glucose-Capillary 254 (*) 70 - 99 (mg/dL)  GLUCOSE, CAPILLARY      Component Value Range   Glucose-Capillary 226 (*) 70 - 99 (mg/dL)  BASIC METABOLIC PANEL      Component Value Range   Sodium 135  135 - 145 (mEq/L)   Potassium 3.7  3.5 - 5.1 (mEq/L)   Chloride 101  96 - 112 (mEq/L)   CO2 26  19 - 32 (mEq/L)   Glucose, Bld 202 (*) 70 - 99 (mg/dL)   BUN 9  6 - 23 (mg/dL)   Creatinine, Ser 0.60  0.50 - 1.35 (mg/dL)   Calcium 8.7  8.4 - 10.5 (mg/dL)   GFR calc non Af Amer >90  >90 (mL/min)   GFR calc Af Amer >90  >90 (mL/min)  CBC      Component Value Range   WBC 10.3  4.0 - 10.5 (K/uL)   RBC 4.36  4.22 - 5.81 (MIL/uL)   Hemoglobin 12.9 (*) 13.0 - 17.0 (g/dL)   HCT 38.5 (*) 39.0 - 52.0 (%)   MCV 88.3  78.0 - 100.0 (fL)   MCH 29.6  26.0 - 34.0 (pg)   MCHC 33.5  30.0 - 36.0 (g/dL)   RDW 13.5  11.5 - 15.5 (%)   Platelets 208  150 - 400 (K/uL)  DIFFERENTIAL      Component Value Range   Neutrophils Relative 74  43 - 77 (%)   Neutro Abs 7.6  1.7 - 7.7 (K/uL)   Lymphocytes Relative 14  12 - 46 (%)   Lymphs Abs 1.5  0.7 - 4.0 (K/uL)   Monocytes Relative 10  3 - 12 (%)   Monocytes Absolute 1.0  0.1 - 1.0 (K/uL)   Eosinophils Relative 2  0 - 5 (%)   Eosinophils Absolute  0.2  0.0 - 0.7 (K/uL)   Basophils Relative 1  0 - 1 (%)   Basophils Absolute 0.1  0.0 - 0.1 (K/uL)  GLUCOSE, CAPILLARY      Component Value Range   Glucose-Capillary 223 (*) 70 - 99 (mg/dL)  GLUCOSE, CAPILLARY      Component Value Range   Glucose-Capillary 248 (*) 70 - 99 (mg/dL)  GLUCOSE, CAPILLARY  Component Value Range   Glucose-Capillary 204 (*) 70 - 99 (mg/dL)  GLUCOSE, CAPILLARY      Component Value Range   Glucose-Capillary 225 (*) 70 - 99 (mg/dL)  GLUCOSE, CAPILLARY      Component Value Range   Glucose-Capillary 303 (*) 70 - 99 (mg/dL)  GLUCOSE, CAPILLARY      Component Value Range   Glucose-Capillary 229 (*) 70 - 99 (mg/dL)   Comment 1 Documented in Chart     Comment 2 Notify RN    GLUCOSE, CAPILLARY      Component Value Range   Glucose-Capillary 263 (*) 70 - 99 (mg/dL)  GLUCOSE, CAPILLARY      Component Value Range   Glucose-Capillary 206 (*) 70 - 99 (mg/dL)  GLUCOSE, CAPILLARY      Component Value Range   Glucose-Capillary 244 (*) 70 - 99 (mg/dL)  CBC      Component Value Range   WBC 7.9  4.0 - 10.5 (K/uL)   RBC 4.43  4.22 - 5.81 (MIL/uL)   Hemoglobin 12.5 (*) 13.0 - 17.0 (g/dL)   HCT 38.6 (*) 39.0 - 52.0 (%)   MCV 87.1  78.0 - 100.0 (fL)   MCH 28.2  26.0 - 34.0 (pg)   MCHC 32.4  30.0 - 36.0 (g/dL)   RDW 13.2  11.5 - 15.5 (%)   Platelets 206  150 - 400 (K/uL)  DIFFERENTIAL      Component Value Range   Neutrophils Relative 71  43 - 77 (%)   Neutro Abs 5.6  1.7 - 7.7 (K/uL)   Lymphocytes Relative 16  12 - 46 (%)   Lymphs Abs 1.3  0.7 - 4.0 (K/uL)   Monocytes Relative 9  3 - 12 (%)   Monocytes Absolute 0.7  0.1 - 1.0 (K/uL)   Eosinophils Relative 3  0 - 5 (%)   Eosinophils Absolute 0.3  0.0 - 0.7 (K/uL)   Basophils Relative 1  0 - 1 (%)   Basophils Absolute 0.1  0.0 - 0.1 (K/uL)  BASIC METABOLIC PANEL      Component Value Range   Sodium 134 (*) 135 - 145 (mEq/L)   Potassium 3.9  3.5 - 5.1 (mEq/L)   Chloride 98  96 - 112 (mEq/L)   CO2 26  19 - 32  (mEq/L)   Glucose, Bld 216 (*) 70 - 99 (mg/dL)   BUN 9  6 - 23 (mg/dL)   Creatinine, Ser 0.63  0.50 - 1.35 (mg/dL)   Calcium 8.8  8.4 - 10.5 (mg/dL)   GFR calc non Af Amer >90  >90 (mL/min)   GFR calc Af Amer >90  >90 (mL/min)  GLUCOSE, CAPILLARY      Component Value Range   Glucose-Capillary 240 (*) 70 - 99 (mg/dL)  GLUCOSE, CAPILLARY      Component Value Range   Glucose-Capillary 201 (*) 70 - 99 (mg/dL)     Signed: Ravon Mcilhenny NEVILL 10/14/2011, 8:34 AM

## 2011-10-18 ENCOUNTER — Emergency Department (HOSPITAL_COMMUNITY): Payer: Medicare Other

## 2011-10-18 ENCOUNTER — Encounter (HOSPITAL_COMMUNITY): Payer: Self-pay | Admitting: Emergency Medicine

## 2011-10-18 ENCOUNTER — Other Ambulatory Visit: Payer: Self-pay

## 2011-10-18 ENCOUNTER — Inpatient Hospital Stay (HOSPITAL_COMMUNITY)
Admission: EM | Admit: 2011-10-18 | Discharge: 2011-10-29 | DRG: 286 | Disposition: A | Discharge status: Home Health | Payer: Medicare Other | Attending: Internal Medicine | Admitting: Internal Medicine

## 2011-10-18 DIAGNOSIS — I10 Essential (primary) hypertension: Secondary | ICD-10-CM | POA: Diagnosis present

## 2011-10-18 DIAGNOSIS — G4733 Obstructive sleep apnea (adult) (pediatric): Secondary | ICD-10-CM | POA: Diagnosis present

## 2011-10-18 DIAGNOSIS — I472 Ventricular tachycardia, unspecified: Secondary | ICD-10-CM | POA: Diagnosis present

## 2011-10-18 DIAGNOSIS — Z96659 Presence of unspecified artificial knee joint: Secondary | ICD-10-CM

## 2011-10-18 DIAGNOSIS — R42 Dizziness and giddiness: Secondary | ICD-10-CM | POA: Diagnosis present

## 2011-10-18 DIAGNOSIS — I4729 Other ventricular tachycardia: Secondary | ICD-10-CM

## 2011-10-18 DIAGNOSIS — N390 Urinary tract infection, site not specified: Secondary | ICD-10-CM | POA: Diagnosis present

## 2011-10-18 DIAGNOSIS — R079 Chest pain, unspecified: Secondary | ICD-10-CM | POA: Diagnosis present

## 2011-10-18 DIAGNOSIS — R55 Syncope and collapse: Secondary | ICD-10-CM | POA: Diagnosis present

## 2011-10-18 DIAGNOSIS — E0800 Diabetes mellitus due to underlying condition with hyperosmolarity without nonketotic hyperglycemic-hyperosmolar coma (NKHHC): Secondary | ICD-10-CM | POA: Diagnosis present

## 2011-10-18 DIAGNOSIS — I2510 Atherosclerotic heart disease of native coronary artery without angina pectoris: Principal | ICD-10-CM | POA: Diagnosis present

## 2011-10-18 DIAGNOSIS — Z7982 Long term (current) use of aspirin: Secondary | ICD-10-CM

## 2011-10-18 DIAGNOSIS — I6350 Cerebral infarction due to unspecified occlusion or stenosis of unspecified cerebral artery: Secondary | ICD-10-CM | POA: Diagnosis present

## 2011-10-18 DIAGNOSIS — Z888 Allergy status to other drugs, medicaments and biological substances status: Secondary | ICD-10-CM

## 2011-10-18 DIAGNOSIS — E1142 Type 2 diabetes mellitus with diabetic polyneuropathy: Secondary | ICD-10-CM | POA: Diagnosis present

## 2011-10-18 DIAGNOSIS — R209 Unspecified disturbances of skin sensation: Secondary | ICD-10-CM | POA: Diagnosis present

## 2011-10-18 DIAGNOSIS — M1710 Unilateral primary osteoarthritis, unspecified knee: Secondary | ICD-10-CM | POA: Diagnosis present

## 2011-10-18 DIAGNOSIS — Z794 Long term (current) use of insulin: Secondary | ICD-10-CM

## 2011-10-18 DIAGNOSIS — Z7902 Long term (current) use of antithrombotics/antiplatelets: Secondary | ICD-10-CM

## 2011-10-18 DIAGNOSIS — E1149 Type 2 diabetes mellitus with other diabetic neurological complication: Secondary | ICD-10-CM | POA: Diagnosis present

## 2011-10-18 DIAGNOSIS — Z6841 Body Mass Index (BMI) 40.0 and over, adult: Secondary | ICD-10-CM

## 2011-10-18 LAB — URINALYSIS, ROUTINE W REFLEX MICROSCOPIC
Hgb urine dipstick: NEGATIVE
Nitrite: NEGATIVE
Protein, ur: NEGATIVE mg/dL
Specific Gravity, Urine: 1.01 (ref 1.005–1.030)
Urobilinogen, UA: 0.2 mg/dL (ref 0.0–1.0)

## 2011-10-18 LAB — COMPREHENSIVE METABOLIC PANEL
AST: 20 U/L (ref 0–37)
BUN: 11 mg/dL (ref 6–23)
CO2: 26 mEq/L (ref 19–32)
Calcium: 9 mg/dL (ref 8.4–10.5)
Chloride: 95 mEq/L — ABNORMAL LOW (ref 96–112)
Creatinine, Ser: 0.58 mg/dL (ref 0.50–1.35)
GFR calc Af Amer: >90 mL/min (ref >90)
GFR calc non Af Amer: >90 mL/min (ref >90)
Glucose, Bld: 262 mg/dL — ABNORMAL HIGH (ref 70–99)
Total Bilirubin: 0.4 mg/dL (ref 0.3–1.2)

## 2011-10-18 LAB — PROTIME-INR: INR: 0.99 (ref 0.00–1.49)

## 2011-10-18 LAB — DIFFERENTIAL
Eosinophils Relative: 2 % (ref 0–5)
Lymphocytes Relative: 14 % (ref 12–46)
Lymphs Abs: 1.5 10*3/uL (ref 0.7–4.0)
Monocytes Absolute: 0.8 10*3/uL (ref 0.1–1.0)
Monocytes Relative: 8 % (ref 3–12)
Neutro Abs: 8 10*3/uL — ABNORMAL HIGH (ref 1.7–7.7)

## 2011-10-18 LAB — POCT I-STAT TROPONIN I: Troponin i, poc: 0 ng/mL (ref 0.00–0.08)

## 2011-10-18 LAB — CBC
HCT: 44.1 % (ref 39.0–52.0)
Hemoglobin: 15.5 g/dL (ref 13.0–17.0)
MCV: 87 fL (ref 78.0–100.0)
WBC: 10.5 10*3/uL (ref 4.0–10.5)

## 2011-10-18 LAB — APTT: aPTT: 32 seconds (ref 24–37)

## 2011-10-18 MED ORDER — SODIUM CHLORIDE 0.9 % IV SOLN
Freq: Once | INTRAVENOUS | Status: AC
  Administered 2011-10-18: 22:00:00 via INTRAVENOUS

## 2011-10-18 MED ORDER — SODIUM CHLORIDE 0.9 % IV SOLN: INTRAVENOUS | Status: DC

## 2011-10-18 NOTE — ED Provider Notes (Signed)
History     CSN: [id number]  Arrival date & time 10/18/11  2041   First MD Initiated Contact with Patient 10/18/11 2123      Chief Complaint  Patient presents with  . Dizziness  . Extremity Weakness    (Consider location/radiation/quality/duration/timing/severity/associated sxs/prior treatment) The history is provided by the patient.   patient presents with chest pain which is intermittent x12 hours. Pain described as pressure in the midportion of his chest with associated dyspnea, no diaphoresis nausea vomiting. Nonexertional occurs at rest. No recent cough or cold symptoms. Pain raised his right arm which has been numb and with paresthesias. No facial droop, change in gait, headaches. Nothing makes symptoms better or worse. No medications used prior to arrival  Past Medical History  Diagnosis Date  . Obese   . Hypertension   . Diabetes mellitus   . Diabetic diarrhea   . Sleep apnea   . Diabetic neuropathy, painful   . Chronic indwelling foley catheter   . Bladder spasms   . Complication of anesthesia   . PONV (postoperative nausea and vomiting)     Past Surgical History  Procedure Date  . Joint replacement     Left Total Knee  . Gastroplasty   . Cholecystectomy     No family history on file.  History  Substance Use Topics  . Smoking status: Never Smoker   . Smokeless tobacco: Never Used  . Alcohol Use: No      Review of Systems  All other systems reviewed and are negative.    Allergies  Cefadroxil; Cephalexin; Lipitor; Metformin and related; Morphine and related; and Robaxin  Home Medications   Current Outpatient Rx  Name Route Sig Dispense Refill  . DIPHENOXYLATE-ATROPINE 2.5-0.025 MG PO TABS Oral Take 1 tablet by mouth 4 (four) times daily as needed. Diarrhea    . GLIMEPIRIDE 4 MG PO TABS Oral Take 4 mg by mouth 2 (two) times daily.    . INSULIN ISOPHANE & REGULAR (70-30) 100 UNIT/ML [place] SUSP Subcutaneous Inject 55 Units into the skin 2 (two) times  daily with a meal.    . MELATONIN 3 MG PO CAPS Oral Take 3 mg by mouth at bedtime as needed. Insomnia    . OMEPRAZOLE 20 MG PO CPDR Oral Take 20 mg by mouth daily.    . OXYBUTYNIN CHLORIDE 5 MG PO TABS Oral Take 5 mg by mouth 3 (three) times daily.    Marland Kitchen RAMIPRIL 10 MG PO CAPS Oral Take 10 mg by mouth daily.      BP 144/76  Pulse 77  Temp 98 F (36.7 C)  Resp 20  Wt 389 lb (176.449 kg)  SpO2 100%  Physical Exam  Nursing note and vitals reviewed. Constitutional: He is oriented to person, place, and time. He appears well-developed and well-nourished.  Non-toxic appearance. No distress.  HENT:  Head: Normocephalic and atraumatic.  Eyes: Conjunctivae, EOM and lids are normal. Pupils are equal, round, and reactive to light.  Neck: Normal range of motion. Neck supple. No tracheal deviation present. No mass present.  Cardiovascular: Normal rate, regular rhythm and normal heart sounds.  Exam reveals no gallop.   No murmur heard. Pulmonary/Chest: Effort normal and breath sounds normal. No stridor. No respiratory distress. He has no decreased breath sounds. He has no wheezes. He has no rhonchi. He has no rales.  Abdominal: Soft. Normal appearance and bowel sounds are normal. He exhibits no distension. There is no tenderness. There is no rebound  and no CVA tenderness.  Musculoskeletal: Normal range of motion. He exhibits no edema and no tenderness.  Neurological: He is alert and oriented to person, place, and time. He has normal strength. No cranial nerve deficit or sensory deficit. GCS eye subscore is 4. GCS verbal subscore is 5. GCS motor subscore is 6.  Skin: Skin is warm and dry. No abrasion and no rash noted.  Psychiatric: He has a normal mood and affect. His speech is normal and behavior is normal.    ED Course  Procedures (including critical care time)   Labs Reviewed  CBC  DIFFERENTIAL  COMPREHENSIVE METABOLIC PANEL  URINALYSIS, ROUTINE W REFLEX MICROSCOPIC  URINE CULTURE    PROTIME-INR  APTT   No results found.   No diagnosis found.    MDM   Date: 10/18/2011  Rate: 76  Rhythm: normal sinus rhythm  QRS Axis: normal  Intervals: normal  ST/T Wave abnormalities: nonspecific ST/T changes  Conduction Disutrbances:left anterior fascicular block  Narrative Interpretation:   Old EKG Reviewed: unchanged  Patient to be evaluated for his chest pain and right arm numbness. No signs of acute vascular compromise to the right upper extremity. Radial pulses 2+. Skin is warm and dry. No discoloration noted. Cardiac enzymes were reviewed no signs of acute ACS at this time. EKG is without acute changes. Spoke with [facility] hospitalist and they will admit the patient.          Leota Jacobsen, MD 10/18/11 2352

## 2011-10-18 NOTE — ED Notes (Signed)
[id number] Expected date:<BR> Expected time:<BR> Means of arrival:<BR> Comments:<BR> Triage

## 2011-10-18 NOTE — ED Notes (Signed)
Pt states R arm went numb this afternoon. Pt also states he became weak this afternoon. Now pt c/o chest tightness for one hour

## 2011-10-18 NOTE — ED Notes (Signed)
Pt alert, c/o dizziness, weakness, left arm numbness, onset today, resp even unlabored, skin pwd, pt has indwelling foley catheter

## 2011-10-18 NOTE — ED Notes (Signed)
Pt refuses to let this RN attempt IV start even though pt has palpable veins for IV start. Pt insists that IV team be called. Called IV team.

## 2011-10-18 NOTE — ED Notes (Signed)
MD at bedside. Dr. Allen at bedside.  

## 2011-10-19 ENCOUNTER — Inpatient Hospital Stay (HOSPITAL_COMMUNITY): Payer: Medicare Other

## 2011-10-19 ENCOUNTER — Encounter (HOSPITAL_COMMUNITY): Payer: Self-pay | Admitting: Internal Medicine

## 2011-10-19 ENCOUNTER — Emergency Department (HOSPITAL_COMMUNITY): Payer: Medicare Other

## 2011-10-19 DIAGNOSIS — R079 Chest pain, unspecified: Secondary | ICD-10-CM | POA: Diagnosis present

## 2011-10-19 DIAGNOSIS — R42 Dizziness and giddiness: Secondary | ICD-10-CM

## 2011-10-19 LAB — COMPREHENSIVE METABOLIC PANEL
ALT: 17 U/L (ref 0–53)
AST: 13 U/L (ref 0–37)
Albumin: 3.2 g/dL — ABNORMAL LOW (ref 3.5–5.2)
Alkaline Phosphatase: 78 U/L (ref 39–117)
Chloride: 97 mEq/L (ref 96–112)
Potassium: 4 mEq/L (ref 3.5–5.1)
Sodium: 130 mEq/L — ABNORMAL LOW (ref 135–145)
Total Bilirubin: 0.4 mg/dL (ref 0.3–1.2)
Total Protein: 6.7 g/dL (ref 6.0–8.3)

## 2011-10-19 LAB — CBC
HCT: 42.6 % (ref 39.0–52.0)
MCH: 30 pg (ref 26.0–34.0)
MCHC: 34.3 g/dL (ref 30.0–36.0)
MCV: 87.7 fL (ref 78.0–100.0)
RDW: 13.4 % (ref 11.5–15.5)
WBC: 9.6 10*3/uL (ref 4.0–10.5)

## 2011-10-19 LAB — CARDIAC PANEL(CRET KIN+CKTOT+MB+TROPI)
CK, MB: 2.9 ng/mL (ref 0.3–4.0)
CK, MB: 2.8 ng/mL (ref 0.3–4.0)
Relative Index: INVALID (ref 0.0–2.5)
Troponin I: <0.3 ng/mL (ref <0.30)
Troponin I: <0.3 ng/mL (ref <0.30)

## 2011-10-19 LAB — GLUCOSE, CAPILLARY
Glucose-Capillary: 309 mg/dL — ABNORMAL HIGH (ref 70–99)
Glucose-Capillary: 186 mg/dL — ABNORMAL HIGH (ref 70–99)

## 2011-10-19 LAB — LIPID PANEL
HDL: 30 mg/dL — ABNORMAL LOW (ref >39)
Total CHOL/HDL Ratio: 5.6 RATIO
VLDL: 27 mg/dL (ref 0–40)

## 2011-10-19 LAB — URINE CULTURE: Culture: NO GROWTH

## 2011-10-19 LAB — HEMOGLOBIN A1C
Hgb A1c MFr Bld: 10 % — ABNORMAL HIGH (ref <5.7)
Mean Plasma Glucose: 237 mg/dL — ABNORMAL HIGH (ref <117)

## 2011-10-19 MED ORDER — DIPHENHYDRAMINE HCL 25 MG PO CAPS: 25.0000 mg | ORAL_CAPSULE | Freq: Four times a day (QID) | ORAL | Status: DC | PRN

## 2011-10-19 MED ORDER — SENNOSIDES-DOCUSATE SODIUM 8.6-50 MG PO TABS
1.0000 | ORAL_TABLET | Freq: Every evening | ORAL | Status: DC | PRN
  Filled 2011-10-19: qty 1

## 2011-10-19 MED ORDER — MELATONIN 3 MG PO CAPS: 3.0000 mg | ORAL_CAPSULE | Freq: Every evening | ORAL | Status: DC | PRN

## 2011-10-19 MED ORDER — ASPIRIN 325 MG PO TABS
325.0000 mg | ORAL_TABLET | Freq: Every day | ORAL | Status: DC
  Administered 2011-10-19 – 2011-10-29 (×11): 325 mg via ORAL
  Filled 2011-10-19 (×11): qty 1

## 2011-10-19 MED ORDER — ENOXAPARIN SODIUM 40 MG/0.4ML ~~LOC~~ SOLN
40.0000 mg | SUBCUTANEOUS | Status: DC
  Administered 2011-10-19 – 2011-10-24 (×6): 40 mg via SUBCUTANEOUS
  Filled 2011-10-19 (×8): qty 0.4

## 2011-10-19 MED ORDER — INSULIN ASPART PROT & ASPART (70-30 MIX) 100 UNIT/ML ~~LOC~~ SUSP
55.0000 [IU] | Freq: Two times a day (BID) | SUBCUTANEOUS | Status: DC
  Administered 2011-10-19 (×2): 55 [IU] via SUBCUTANEOUS
  Filled 2011-10-19: qty 3

## 2011-10-19 MED ORDER — DIPHENOXYLATE-ATROPINE 2.5-0.025 MG PO TABS
2.0000 | ORAL_TABLET | Freq: Four times a day (QID) | ORAL | Status: DC | PRN
  Administered 2011-10-19 – 2011-10-29 (×8): 2 via ORAL
  Filled 2011-10-19 (×5): qty 2
  Filled 2011-10-19 (×2): qty 1
  Filled 2011-10-19 (×2): qty 2

## 2011-10-19 MED ORDER — PANTOPRAZOLE SODIUM 40 MG PO TBEC
40.0000 mg | DELAYED_RELEASE_TABLET | Freq: Every day | ORAL | Status: DC
  Administered 2011-10-19 – 2011-10-29 (×10): 40 mg via ORAL
  Filled 2011-10-19 (×11): qty 1

## 2011-10-19 MED ORDER — OXYBUTYNIN CHLORIDE 5 MG PO TABS
5.0000 mg | ORAL_TABLET | Freq: Three times a day (TID) | ORAL | Status: DC
  Filled 2011-10-19 (×3): qty 1

## 2011-10-19 MED ORDER — ASPIRIN 300 MG RE SUPP
300.0000 mg | Freq: Every day | RECTAL | Status: DC
  Filled 2011-10-19 (×11): qty 1

## 2011-10-19 MED ORDER — SODIUM CHLORIDE 0.9 % IV SOLN
INTRAVENOUS | Status: DC
  Administered 2011-10-19: 03:00:00 via INTRAVENOUS

## 2011-10-19 MED ORDER — GLIMEPIRIDE 4 MG PO TABS
4.0000 mg | ORAL_TABLET | Freq: Every day | ORAL | Status: DC
  Administered 2011-10-19 – 2011-10-28 (×9): 4 mg via ORAL
  Filled 2011-10-19 (×11): qty 1

## 2011-10-19 MED ORDER — CIPROFLOXACIN HCL 500 MG PO TABS
500.0000 mg | ORAL_TABLET | Freq: Two times a day (BID) | ORAL | Status: DC
  Filled 2011-10-19 (×2): qty 1

## 2011-10-19 MED ORDER — RAMIPRIL 10 MG PO CAPS
10.0000 mg | ORAL_CAPSULE | Freq: Every day | ORAL | Status: DC
  Administered 2011-10-19: 10 mg via ORAL
  Filled 2011-10-19 (×2): qty 1

## 2011-10-19 MED ORDER — INSULIN ASPART 100 UNIT/ML ~~LOC~~ SOLN
0.0000 [IU] | Freq: Three times a day (TID) | SUBCUTANEOUS | Status: DC
  Filled 2011-10-19: qty 3

## 2011-10-19 NOTE — Progress Notes (Signed)
Inpatient Diabetes Program Recommendations  AACE/ADA: New Consensus Statement on Inpatient Glycemic Control (2009)  Target Ranges:  Prepandial:   less than 140 mg/dL      Peak postprandial:   less than 180 mg/dL (1-2 hours)      Critically ill patients:  140 - 180 mg/dL   Reason for Visit: Hyperglycemia  Results for [name], [name] (MRN [id number]) as of 10/19/2011 10:40  Ref. Range 10/14/2011 04:56 10/18/2011 21:35 10/19/2011 02:00  Glucose Latest Range: 70-99 mg/dL 216 (H) 262 (H) 280 (H)  Results for [name], [name] (MRN [id number]) as of 10/19/2011 10:40  Ref. Range 10/10/2011 10:55  Hemoglobin A1C Latest Range: <5.7 % 10.1 (H)    Inpatient Diabetes Program Recommendations Correction (SSI): Add Novolog resistant tidwc and hs Outpatient Referral: OP Diabetes Education consult for uncontrolled DM ( HgbA1C - 10.1%)

## 2011-10-19 NOTE — Consult Note (Signed)
Admit date: 10/18/2011 Referring Physician  [name]  Primary Physician  [name] Reason for Consultation  Chest pain  HPI: This ia a 66yo morbidly obese white male with history of DM, HTN and OSA who presented to the ER with complaints of dizziness, chest pressure and right arm numbness.  The pressure started about 6pm while he was sitting.  The pressure went across his chest like a weight.  He has chronic SOB but does not know if it got worse.  He did become diaphoretic and nauseated and vomited.  After he vomited the pressure improved and eventually resolved.  Since last PM he has not had any further pressure.  He says at 3pm he noticed lightheadedness and then his right went completely numb along with the right side of his face but the facial numbess eventually resolved but his right hand is still numb.       PMH:   Past Medical History  Diagnosis Date  . Obese   . Hypertension   . Diabetes mellitus   . Diabetic diarrhea   . Sleep apnea   . Diabetic neuropathy, painful   . Chronic indwelling foley catheter   . Bladder spasms   . Complication of anesthesia   . PONV (postoperative nausea and vomiting)      PSH:   Past Surgical History  Procedure Date  . Joint replacement     Left Total Knee  . Gastroplasty   . Cholecystectomy     Allergies:  Cefadroxil; Cephalexin; Lipitor; Metformin and related; Morphine and related; and Robaxin Prior to Admit Meds:   Prescriptions prior to admission  Medication Sig Dispense Refill  . ciprofloxacin (CIPRO) 500 MG tablet Take 500 mg by mouth 2 (two) times daily.      . diphenhydrAMINE (BENADRYL) 25 mg capsule Take 25 mg by mouth every 6 (six) hours as needed. For allergy.      . diphenoxylate-atropine (LOMOTIL) 2.5-0.025 MG per tablet Take 2 tablets by mouth 4 (four) times daily as needed. Diarrhea      . glimepiride (AMARYL) 4 MG tablet Take 4 mg by mouth daily before lunch.       . insulin NPH-insulin regular (NOVOLIN 70/30)  (70-30) 100 UNIT/ML injection Inject 55 Units into the skin 2 (two) times daily with a meal.      . Melatonin 3 MG CAPS Take 3 mg by mouth at bedtime as needed. Insomnia      . omeprazole (PRILOSEC) 20 MG capsule Take 20 mg by mouth daily.      Marland Kitchen oxybutynin (DITROPAN) 5 MG tablet Take 5 mg by mouth 3 (three) times daily.      . ramipril (ALTACE) 10 MG capsule Take 10 mg by mouth daily.       Fam HX:   History reviewed. No pertinent family history. Social HX:    History   Social History  . Marital Status: Single    Spouse Name: N/A    Number of Children: N/A  . Years of Education: N/A   Occupational History  . Not on file.   Social History Main Topics  . Smoking status: Former Smoker -- 2.0 packs/day  . Smokeless tobacco: Never Used  . Alcohol Use: No  . Drug Use: No  . Sexually Active: No   Other Topics Concern  . Not on file   Social History Narrative  . No narrative on file     ROS:  All 11 ROS were addressed  and are negative except what is stated in the HPI  Physical Exam: Blood pressure 150/90, pulse 96, temperature 97.8 F (36.6 C), temperature source Oral, resp. rate 22, height 6\' 1"  (1.854 m), weight 173.637 kg (382 lb 12.8 oz), SpO2 95.00%.    General: Well developed, well nourished, in no acute distress Head: Eyes PERRLA, No xanthomas.   Normal cephalic and atramatic  Lungs:   Clear bilaterally to auscultation and percussion. Heart:   HRRR S1 S2 Pulses are 2+ & equal.            No carotid bruit. No JVD.  No abdominal bruits. No femoral bruits. Abdomen: Bowel sounds are positive, abdomen soft and non-tender without masses Extremities:   No clubbing, cyanosis or edema.  DP +1 Neuro: Alert and oriented X 3. Psych:  Good affect, responds appropriately    Labs:   Lab Results  Component Value Date   WBC 9.6 10/19/2011   HGB 14.6 10/19/2011   HCT 42.6 10/19/2011   MCV 87.7 10/19/2011   PLT 246 10/19/2011    Lab 10/19/11 0200  NA 130*  K 4.0  CL 97  CO2 26    BUN 9  CREATININE 0.53  CALCIUM 8.6  PROT 6.7  BILITOT 0.4  ALKPHOS 78  ALT 17  AST 13  GLUCOSE 280*   No results found for this basename: PTT   Lab Results  Component Value Date   INR 0.99 10/18/2011   INR 1.03 10/10/2011   INR 1.08 08/26/2010   Lab Results  Component Value Date   CKTOTAL 47 10/19/2011   CKMB 2.8 10/19/2011   TROPONINI <0.30 10/19/2011     Lab Results  Component Value Date   CHOL 168 10/19/2011   CHOL  Value: 162        ATP III CLASSIFICATION:  <200     mg/dL   Desirable  200-239  mg/dL   Borderline High  >=240    mg/dL   High        08/26/2010   Lab Results  Component Value Date   HDL 30* 10/19/2011   HDL 28* 08/26/2010   Lab Results  Component Value Date   LDLCALC 111* 10/19/2011   LDLCALC  Value: 106        Total Cholesterol/HDL:CHD Risk Coronary Heart Disease Risk Table                     Men   Women  1/2 Average Risk   3.4   3.3  Average Risk       5.0   4.4  2 X Average Risk   9.6   7.1  3 X Average Risk  23.4   11.0        Use the calculated Patient Ratio above and the CHD Risk Table to determine the patient's CHD Risk.        ATP III CLASSIFICATION (LDL):  <100     mg/dL   Optimal  100-129  mg/dL   Near or Above                    Optimal  130-159  mg/dL   Borderline  160-189  mg/dL   High  >190     mg/dL   Very High* 08/26/2010   Lab Results  Component Value Date   TRIG 135 10/19/2011   TRIG 140 08/26/2010   Lab Results  Component Value Date   CHOLHDL 5.6 10/19/2011  CHOLHDL 5.8 08/26/2010   No results found for this basename: LDLDIRECT      Radiology:  Dg Chest 2 View  10/18/2011  *RADIOLOGY REPORT*  Clinical Data: Chest tightness  CHEST - 2 VIEW  Comparison: 10/10/2011  Findings: Mild cardiomegaly.  Vascular clips in the left upper abdomen.  Relatively low lung volumes.  Some improvement in interstitial edema since previous exam.  No effusion.  Mild compression deformities in the lower thoracic spine   stable.  IMPRESSION:  1.  Cardiomegaly with    improvement in interstitial edema since previous exam.  Original Report Authenticated By: Trecia Rogers, M.D.   Ct Head Wo Contrast  10/18/2011  *RADIOLOGY REPORT*  Clinical Data: Dizziness, confusion, hypertension  CT HEAD WITHOUT CONTRAST  Technique:  Contiguous axial images were obtained from the base of the skull through the vertex without contrast.  Comparison: 10/10/2011 and earlier studies  Findings: Stable bilateral basal ganglia and thalamic mineralization.  Mild atrophy.  Small left caudate lacunar infarct, stable. There is no evidence of acute intracranial hemorrhage, brain edema, mass lesion, acute infarction,   mass effect, or midline shift. Acute infarct may be inapparent on noncontrast CT. No other intra-axial abnormalities are seen, and the ventricles and sulci are within normal limits in size and symmetry.   No abnormal extra-axial fluid collections or masses are identified.  No significant calvarial abnormality.  IMPRESSION: 1. Negative for bleed or other acute intracranial process.  Original Report Authenticated By: Trecia Rogers, M.D.    EKG:  NSR with LAFB and PVC's  ASSESSMENT:  1.  Chest pain with negative enzymes x 1.  EKG is nonischemic. 2.  Right arm numbness of ? Etiology - started several hours before chest pressure and his right hand is still numb 3.  Morbid obesity 4.  DM 5.  HTN 6.  OSA  PLAN:   1.  Continue to cycle enzymes x 2 2.  2 day stress test if enzymes negative - will get rest images today 3.  2D echo to evaluate LVF  Sueanne Margarita, MD  10/19/2011  11:30 AM

## 2011-10-19 NOTE — Progress Notes (Signed)
Pt CBG 352.  Notified [name].  No new orders received.

## 2011-10-19 NOTE — Progress Notes (Signed)
MRI called to inform me that they are unable to do MRI scan on pt due to his weight. They can't scan patients over 350lbs. Travonna Swindle, Julieta Gutting RN

## 2011-10-19 NOTE — Progress Notes (Signed)
*  PRELIMINARY RESULTS* Echocardiogram 2D Echocardiogram has been performed.  Roxine Caddy [facility] 10/19/2011, 4:07 PM

## 2011-10-19 NOTE — Progress Notes (Signed)
Pt had 3 beats V Tach.  MD notified.  No new orders received.

## 2011-10-19 NOTE — H&P (Signed)
[name] is an 66 y.o. male.   PCP - Dr.John Laurann Montana. Chief Complaint: Chest pain dizziness and right hand numbness. HPI: 66 year old male with history of diabetes mellitus2, hypertension was recently in the hospital for UTI presented to the ER with complaints of chest pain and also had dizziness with number right hand. The patient states at around 3 PM last evening at his home while watching TV he suddenly the dizziness which is increased on movement and position changes at the same time to have some numbness in his right hand. Patient also had decreased grip strength in his right hand. After a few minutes he started developing chest pain which was retrosternal and pressure-like nonradiating and did not have any associated shortness of breath nausea palpitations cough or phlegm. Around 8 PM patient decided to come to the ER. Patient's cardiac enzyme CT head are negative patient will be admitted for further workup. Patient has not had any loss of strength in his left upper extremity or both lower extremities. Did not have any visual symptoms or any difficulty speaking or swallowing.    of Past Surgical History  Procedure Date  . Joint replacement     Left Total Knee  . Gastroplasty   . Cholecystectomy     History reviewed. No pertinent family history. Social History:  reports that he has never smoked. He has never used smokeless tobacco. He reports that he does not drink alcohol or use illicit drugs.  Allergies:  Allergies  Allergen Reactions  . Cefadroxil Hives  . Cephalexin Hives  . Lipitor (Atorvastatin Calcium) Swelling  . Metformin And Related Swelling  . Morphine And Related Other (See Comments)    Sweating, feels like is "in rocky boat."  . Robaxin Other (See Comments)    Feels like is in "rocky boat"    Medications Prior to Admission  Medication Dose Route Frequency Provider Last Rate Last Dose  . 0.9 %  sodium chloride infusion   Intravenous Once Leota Jacobsen, MD  125 mL/hr at 10/18/11 2152    . 0.9 %  sodium chloride infusion   Intravenous STAT Leota Jacobsen, MD       Medications Prior to Admission  Medication Sig Dispense Refill  . ciprofloxacin (CIPRO) 500 MG tablet Take 500 mg by mouth 2 (two) times daily.      . diphenoxylate-atropine (LOMOTIL) 2.5-0.025 MG per tablet Take 2 tablets by mouth 4 (four) times daily as needed. Diarrhea      . glimepiride (AMARYL) 4 MG tablet Take 4 mg by mouth daily before lunch.       . insulin NPH-insulin regular (NOVOLIN 70/30) (70-30) 100 UNIT/ML injection Inject 55 Units into the skin 2 (two) times daily with a meal.      . Melatonin 3 MG CAPS Take 3 mg by mouth at bedtime as needed. Insomnia      . omeprazole (PRILOSEC) 20 MG capsule Take 20 mg by mouth daily.      Marland Kitchen oxybutynin (DITROPAN) 5 MG tablet Take 5 mg by mouth 3 (three) times daily.      . ramipril (ALTACE) 10 MG capsule Take 10 mg by mouth daily.        Results for orders placed during the hospital encounter of 10/18/11 (from the past 48 hour(s))  CBC     Status: Normal   Collection Time   10/18/11  9:35 PM      Component Value Range Comment   WBC 10.5  4.0 - 10.5 (K/uL)    RBC 5.07  4.22 - 5.81 (MIL/uL)    Hemoglobin 15.5  13.0 - 17.0 (g/dL)    HCT 44.1  39.0 - 52.0 (%)    MCV 87.0  78.0 - 100.0 (fL)    MCH 30.6  26.0 - 34.0 (pg)    MCHC 35.1  30.0 - 36.0 (g/dL)    RDW 13.3  11.5 - 15.5 (%)    Platelets 266  150 - 400 (K/uL)   DIFFERENTIAL     Status: Abnormal   Collection Time   10/18/11  9:35 PM      Component Value Range Comment   Neutrophils Relative 76  43 - 77 (%)    Neutro Abs 8.0 (*) 1.7 - 7.7 (K/uL)    Lymphocytes Relative 14  12 - 46 (%)    Lymphs Abs 1.5  0.7 - 4.0 (K/uL)    Monocytes Relative 8  3 - 12 (%)    Monocytes Absolute 0.8  0.1 - 1.0 (K/uL)    Eosinophils Relative 2  0 - 5 (%)    Eosinophils Absolute 0.2  0.0 - 0.7 (K/uL)    Basophils Relative 1  0 - 1 (%)    Basophils Absolute 0.1  0.0 - 0.1 (K/uL)     COMPREHENSIVE METABOLIC PANEL     Status: Abnormal   Collection Time   10/18/11  9:35 PM      Component Value Range Comment   Sodium 131 (*) 135 - 145 (mEq/L)    Potassium 4.1  3.5 - 5.1 (mEq/L)    Chloride 95 (*) 96 - 112 (mEq/L)    CO2 26  19 - 32 (mEq/L)    Glucose, Bld 262 (*) 70 - 99 (mg/dL)    BUN 11  6 - 23 (mg/dL)    Creatinine, Ser 0.58  0.50 - 1.35 (mg/dL)    Calcium 9.0  8.4 - 10.5 (mg/dL)    Total Protein 7.1  6.0 - 8.3 (g/dL)    Albumin 3.5  3.5 - 5.2 (g/dL)    AST 20  0 - 37 (U/L) SLIGHT HEMOLYSIS   ALT 19  0 - 53 (U/L)    Alkaline Phosphatase 82  39 - 117 (U/L)    Total Bilirubin 0.4  0.3 - 1.2 (mg/dL)    GFR calc non Af Amer >90  >90 (mL/min)    GFR calc Af Amer >90  >90 (mL/min)   PROTIME-INR     Status: Normal   Collection Time   10/18/11  9:35 PM      Component Value Range Comment   Prothrombin Time 13.3  11.6 - 15.2 (seconds)    INR 0.99  0.00 - 1.49    APTT     Status: Normal   Collection Time   10/18/11  9:35 PM      Component Value Range Comment   aPTT 32  24 - 37 (seconds)   POCT I-STAT TROPONIN I     Status: Normal   Collection Time   10/18/11  9:49 PM      Component Value Range Comment   Troponin i, poc 0.00  0.00 - 0.08 (ng/mL)    Comment 3            URINALYSIS, ROUTINE W REFLEX MICROSCOPIC     Status: Abnormal   Collection Time   10/18/11  9:55 PM      Component Value Range Comment   Color, Urine YELLOW  YELLOW     APPearance CLEAR  CLEAR     Specific Gravity, Urine 1.010  1.005 - 1.030     pH 6.0  5.0 - 8.0     Glucose, UA 250 (*) NEGATIVE (mg/dL)    Hgb urine dipstick NEGATIVE  NEGATIVE     Bilirubin Urine NEGATIVE  NEGATIVE     Ketones, ur NEGATIVE  NEGATIVE (mg/dL)    Protein, ur NEGATIVE  NEGATIVE (mg/dL)    Urobilinogen, UA 0.2  0.0 - 1.0 (mg/dL)    Nitrite NEGATIVE  NEGATIVE     Leukocytes, UA NEGATIVE  NEGATIVE  MICROSCOPIC NOT DONE ON URINES WITH NEGATIVE PROTEIN, BLOOD, LEUKOCYTES, NITRITE, OR GLUCOSE <1000 mg/dL.   Dg Chest 2  View  10/18/2011  *RADIOLOGY REPORT*  Clinical Data: Chest tightness  CHEST - 2 VIEW  Comparison: 10/10/2011  Findings: Mild cardiomegaly.  Vascular clips in the left upper abdomen.  Relatively low lung volumes.  Some improvement in interstitial edema since previous exam.  No effusion.  Mild compression deformities in the lower thoracic spine   stable.  IMPRESSION:  1.  Cardiomegaly with   improvement in interstitial edema since previous exam.  Original Report Authenticated By: Trecia Rogers, M.D.   Ct Head Wo Contrast  10/18/2011  *RADIOLOGY REPORT*  Clinical Data: Dizziness, confusion, hypertension  CT HEAD WITHOUT CONTRAST  Technique:  Contiguous axial images were obtained from the base of the skull through the vertex without contrast.  Comparison: 10/10/2011 and earlier studies  Findings: Stable bilateral basal ganglia and thalamic mineralization.  Mild atrophy.  Small left caudate lacunar infarct, stable. There is no evidence of acute intracranial hemorrhage, brain edema, mass lesion, acute infarction,   mass effect, or midline shift. Acute infarct may be inapparent on noncontrast CT. No other intra-axial abnormalities are seen, and the ventricles and sulci are within normal limits in size and symmetry.   No abnormal extra-axial fluid collections or masses are identified.  No significant calvarial abnormality.  IMPRESSION: 1. Negative for bleed or other acute intracranial process.  Original Report Authenticated By: Trecia Rogers, M.D.    Review of Systems  Constitutional: Negative.   HENT: Negative.   Eyes: Negative.   Respiratory: Negative.   Cardiovascular: Positive for chest pain.  Gastrointestinal: Negative.   Genitourinary: Negative.   Skin: Negative.   Neurological: Positive for dizziness.       Right hand numbness.  Endo/Heme/Allergies: Negative.   Psychiatric/Behavioral: Negative.     Blood pressure 125/55, pulse 84, temperature 98.1 F (36.7 C), temperature source  Oral, resp. rate 20, weight 176.449 kg (389 lb), SpO2 100.00%. Physical Exam  Constitutional: He is oriented to person, place, and time. He appears well-developed.       Obese.  HENT:  Head: Normocephalic and atraumatic.  Right Ear: External ear normal.  Left Ear: External ear normal.  Nose: Nose normal.  Mouth/Throat: Oropharynx is clear and moist. No oropharyngeal exudate.  Eyes: Conjunctivae are normal. Pupils are equal, round, and reactive to light. Right eye exhibits no discharge. Left eye exhibits no discharge. No scleral icterus.  Neck: Normal range of motion. Neck supple.  Cardiovascular: Normal rate, regular rhythm and normal heart sounds.   Respiratory: Effort normal and breath sounds normal. No respiratory distress. He has no wheezes. He has no rales.  GI: Soft. Bowel sounds are normal. He exhibits no distension. There is no tenderness. There is no rebound.  Musculoskeletal: Normal range of motion. He exhibits no  edema and no tenderness.  Neurological: He is alert and oriented to person, place, and time.       Moves upper and lower extremities 5/5. Has decreased grip strength in right hand.  Skin: Skin is warm.       Chronic skin changes in lower extremities.  Psychiatric: His behavior is normal.     Assessment/Plan  #1. Chest pain will rule out ACS - cycle cardiac markers, place patient on aspirin and check 2-D echo. #2. Dizziness with right upper extremity numbness  - will rule out CVA. Patient placed on neurochecks get MRI/MRA of the brain with carotid Doppler and 2-D echo. #3. Recent UTI - continue Cipro. #4. History of diabetes mellitus 2 and hypertension  - continue present medications.  I have left a message for [name] for this admission. CODE STATUS  - full code.  Wilhelmina Hark N. 10/19/2011, 1:30 AM

## 2011-10-19 NOTE — Progress Notes (Signed)
*  PRELIMINARY RESULTS* Vascular Ultrasound Carotid Duplex (Doppler) has been completed.  Preliminary findings: Bilateral:  No evidence of hemodynamically significant internal carotid artery stenosis.   Vertebral artery flow is antegrade.     [name] 10/19/2011, 12:08 PM

## 2011-10-20 ENCOUNTER — Inpatient Hospital Stay (HOSPITAL_COMMUNITY)
Admit: 2011-10-20 | Discharge: 2011-10-20 | Disposition: A | Discharge status: Home / Self Care | Payer: Medicare Other | Attending: Cardiology | Admitting: Cardiology

## 2011-10-20 ENCOUNTER — Inpatient Hospital Stay (HOSPITAL_COMMUNITY): Admission: RE | Admit: 2011-10-20 | Payer: Medicare Other | Source: Ambulatory Visit

## 2011-10-20 ENCOUNTER — Other Ambulatory Visit (HOSPITAL_COMMUNITY): Payer: Medicare Other

## 2011-10-20 DIAGNOSIS — R079 Chest pain, unspecified: Secondary | ICD-10-CM

## 2011-10-20 DIAGNOSIS — I10 Essential (primary) hypertension: Secondary | ICD-10-CM

## 2011-10-20 LAB — MAGNESIUM: Magnesium: 2.1 mg/dL (ref 1.5–2.5)

## 2011-10-20 LAB — BASIC METABOLIC PANEL
BUN: 9 mg/dL (ref 6–23)
CO2: 26 mEq/L (ref 19–32)
Chloride: 101 mEq/L (ref 96–112)
GFR calc Af Amer: >90 mL/min (ref >90)
Potassium: 4.3 mEq/L (ref 3.5–5.1)

## 2011-10-20 LAB — GLUCOSE, CAPILLARY
Glucose-Capillary: 142 mg/dL — ABNORMAL HIGH (ref 70–99)
Glucose-Capillary: 269 mg/dL — ABNORMAL HIGH (ref 70–99)

## 2011-10-20 MED ORDER — INSULIN ASPART 100 UNIT/ML ~~LOC~~ SOLN
6.0000 [IU] | Freq: Three times a day (TID) | SUBCUTANEOUS | Status: DC
  Administered 2011-10-20 – 2011-10-22 (×6): 6 [IU] via SUBCUTANEOUS

## 2011-10-20 MED ORDER — REGADENOSON 0.4 MG/5ML IV SOLN
0.4000 mg | Freq: Once | INTRAVENOUS | Status: AC
  Administered 2011-10-20: 0.4 mg via INTRAVENOUS

## 2011-10-20 MED ORDER — INSULIN GLARGINE 100 UNIT/ML ~~LOC~~ SOLN: 20.0000 [IU] | Freq: Every day | SUBCUTANEOUS | Status: DC

## 2011-10-20 MED ORDER — ACETAMINOPHEN 325 MG PO TABS
650.0000 mg | ORAL_TABLET | ORAL | Status: DC | PRN
  Administered 2011-10-20 – 2011-10-22 (×3): 650 mg via ORAL
  Filled 2011-10-20 (×3): qty 2

## 2011-10-20 MED ORDER — INSULIN GLARGINE 100 UNIT/ML ~~LOC~~ SOLN
20.0000 [IU] | Freq: Every day | SUBCUTANEOUS | Status: DC
  Administered 2011-10-20: 20 [IU] via SUBCUTANEOUS
  Filled 2011-10-20: qty 3

## 2011-10-20 MED ORDER — INSULIN ASPART 100 UNIT/ML ~~LOC~~ SOLN
0.0000 [IU] | Freq: Three times a day (TID) | SUBCUTANEOUS | Status: DC
Start: 2011-10-20 — End: 2011-10-29
  Administered 2011-10-20 – 2011-10-21 (×4): 7 [IU] via SUBCUTANEOUS
  Administered 2011-10-21: 4 [IU] via SUBCUTANEOUS
  Administered 2011-10-22 (×2): 11 [IU] via SUBCUTANEOUS
  Administered 2011-10-22: 4 [IU] via SUBCUTANEOUS
  Administered 2011-10-23 – 2011-10-24 (×4): 7 [IU] via SUBCUTANEOUS
  Administered 2011-10-25: 4 [IU] via SUBCUTANEOUS
  Administered 2011-10-25: 7 [IU] via SUBCUTANEOUS
  Administered 2011-10-25 – 2011-10-27 (×4): 4 [IU] via SUBCUTANEOUS
  Administered 2011-10-27: 7 [IU] via SUBCUTANEOUS
  Administered 2011-10-27 – 2011-10-28 (×3): 3 [IU] via SUBCUTANEOUS
  Administered 2011-10-28: 14 [IU] via SUBCUTANEOUS
  Administered 2011-10-29: 4 [IU] via SUBCUTANEOUS
  Filled 2011-10-20 (×3): qty 3

## 2011-10-20 MED ORDER — TEMAZEPAM 15 MG PO CAPS
15.0000 mg | ORAL_CAPSULE | Freq: Every evening | ORAL | Status: DC | PRN
  Administered 2011-10-20 – 2011-10-28 (×5): 15 mg via ORAL
  Filled 2011-10-20 (×6): qty 1

## 2011-10-20 MED ORDER — RAMIPRIL 10 MG PO CAPS
10.0000 mg | ORAL_CAPSULE | Freq: Two times a day (BID) | ORAL | Status: DC
  Administered 2011-10-20 – 2011-10-29 (×19): 10 mg via ORAL
  Filled 2011-10-20 (×22): qty 1

## 2011-10-20 NOTE — Progress Notes (Addendum)
Pt complains of tingling, numbness, pain in his right hand to upper arm.  Paged MD and reviewed vital signs.  Patient requesting tylenol.  Received order from MD.

## 2011-10-20 NOTE — Progress Notes (Signed)
Subjective: Numbness in arm and face about the same.  Dizzy when he moves.  No chest pain or SOB  Objective: Vital signs in last 24 hours: Temp:  [97.6 F (36.4 C)-98.7 F (37.1 C)] 98.7 F (37.1 C) (02/09 0549) Pulse Rate:  [73-103] 73  (02/09 0549) Resp:  [18-24] 18  (02/09 0549) BP: (137-150)/(81-91) 150/87 mmHg (02/09 0549) SpO2:  [94 %-100 %] 100 % (02/09 0549) Weight change:  Last BM Date: 10/18/11  Intake/Output from previous day: 02/08 0701 - 02/09 0700 In: 720 [P.O.:720] Out: 2750 [Urine:2750] Intake/Output this shift:    General appearance: alert Resp: clear to auscultation bilaterally Cardio: regular rate and rhythm, S1, S2 normal, no murmur, click, rub or gallop Extremities: extremities normal, atraumatic, no cyanosis or edema  Lab Results:  Basename 10/19/11 0200 10/18/11 2135  WBC 9.6 10.5  HGB 14.6 15.5  HCT 42.6 44.1  PLT 246 266   BMET  Basename 10/20/11 0501 10/19/11 0200  NA 136 130*  K 4.3 4.0  CL 101 97  CO2 26 26  GLUCOSE 136* 280*  BUN 9 9  CREATININE 0.70 0.53  CALCIUM 9.0 8.6    Studies/Results: Dg Chest 2 View  10/18/2011  *RADIOLOGY REPORT*  Clinical Data: Chest tightness  CHEST - 2 VIEW  Comparison: 10/10/2011  Findings: Mild cardiomegaly.  Vascular clips in the left upper abdomen.  Relatively low lung volumes.  Some improvement in interstitial edema since previous exam.  No effusion.  Mild compression deformities in the lower thoracic spine   stable.  IMPRESSION:  1.  Cardiomegaly with   improvement in interstitial edema since previous exam.  Original Report Authenticated By: Trecia Rogers, M.D.   Ct Head Wo Contrast  10/18/2011  *RADIOLOGY REPORT*  Clinical Data: Dizziness, confusion, hypertension  CT HEAD WITHOUT CONTRAST  Technique:  Contiguous axial images were obtained from the base of the skull through the vertex without contrast.  Comparison: 10/10/2011 and earlier studies  Findings: Stable bilateral basal ganglia and  thalamic mineralization.  Mild atrophy.  Small left caudate lacunar infarct, stable. There is no evidence of acute intracranial hemorrhage, brain edema, mass lesion, acute infarction,   mass effect, or midline shift. Acute infarct may be inapparent on noncontrast CT. No other intra-axial abnormalities are seen, and the ventricles and sulci are within normal limits in size and symmetry.   No abnormal extra-axial fluid collections or masses are identified.  No significant calvarial abnormality.  IMPRESSION: 1. Negative for bleed or other acute intracranial process.  Original Report Authenticated By: Trecia Rogers, M.D.    Medications: I have reviewed the patient's current medications.  Assessment/Plan: Principal Problem:  *Chest pain  Cardiac enzymes negative, nuclear study today Active Problems:  R sided numbness no change.  Cannot have inpt MRI because of weight.  Repeat CT brain tomorrow.       Carotid U/S negative, echo pending  DM (diabetes mellitus) with complications control improving, switch to lantus and SSI while NPO  Hypertension OK  Dizziness  Not orthostatic, sounds more like vertigo   LOS: 2 days   Tynan Boesel JOSEPH 10/20/2011, 8:29 AM

## 2011-10-20 NOTE — Progress Notes (Signed)
Nuclear supervised.  Two day study SR, LAFB, PVCs Typical sx's with Lexiscan  Await images

## 2011-10-20 NOTE — Evaluation (Signed)
Speech Language Pathology Evaluation Patient Details Name: [name] MRN: [id number] DOB: 07-14-1946 Today's Date: 10/20/2011  Problem List:  Patient Active Problem List  Diagnoses  . DM (diabetes mellitus) with complications  . Polyneuropathy in diabetes  . OSA (obstructive sleep apnea)  . Diabetic neuropathy, painful  . Painful bladder spasm  . Dehydration  . UTI (lower urinary tract infection)  . Hyperglycemia without ketosis  . Generalized headaches  . Hypertension  . Chest pain  . Dizziness   Past Medical History:  Past Medical History  Diagnosis Date  . Obese   . Hypertension   . Diabetes mellitus   . Diabetic diarrhea   . Sleep apnea   . Diabetic neuropathy, painful   . Chronic indwelling foley catheter   . Bladder spasms   . Complication of anesthesia   . PONV (postoperative nausea and vomiting)    Past Surgical History:  Past Surgical History  Procedure Date  . Joint replacement     Left Total Knee  . Gastroplasty   . Cholecystectomy     SLP Assessment/Plan/Recommendation Cognitive-linguistic skills functional and judged to be at baseline at this time. Education complete on role of SLP if patient feels he has needs in the future. No further skilled SLP tx indicated at this time.   SLP Recommendation/Assessment: Patient does not need any further Speech [place], [place] [phone]  [place] 10/20/2011, 9:13 AM

## 2011-10-20 NOTE — Progress Notes (Signed)
PT Cancellation Note  Treatment cancelled today due to he states he always gets dizzy when he gets up and he would rather not do that at this time.  He went to [facility] for an test this AM and will return there to complete test tomorrow.  Will wait until evaluation is complete and recommendations are received before PT evaluation is performed.  Norwood Levo 10/20/2011, 2:46 PM

## 2011-10-20 NOTE — Progress Notes (Signed)
SUBJECTIVE: The patient is doing well today. Chest discomfort is resolved.  He continues to have some R hand numbness and well as numbness of his R lip.    Marland Kitchen aspirin  300 mg Rectal Daily   Or  . aspirin  325 mg Oral Daily  . enoxaparin  40 mg Subcutaneous Q24H  . glimepiride  4 mg Oral QAC lunch  . insulin aspart  0-20 Units Subcutaneous TID WC  . insulin aspart  6 Units Subcutaneous TID WC  . insulin glargine  20 Units Subcutaneous Daily  . insulin glargine  20 Units Subcutaneous QHS  . pantoprazole  40 mg Oral Q1200  . ramipril  10 mg Oral Daily  . DISCONTD: insulin aspart protamine-insulin aspart  55 Units Subcutaneous BID WC      OBJECTIVE: Physical Exam: Filed Vitals:   10/19/11 1006 10/19/11 1007 10/19/11 2041 10/20/11 0549  BP: 144/91 150/90 137/81 150/87  Pulse: 102 96 88 73  Temp:   97.6 F (36.4 C) 98.7 F (37.1 C)  TempSrc:   Oral Oral  Resp: 22 22 20 18   Height:      Weight:      SpO2:  95% 94% 100%    Intake/Output Summary (Last 24 hours) at 10/20/11 0843 Last data filed at 10/20/11 0550  Gross per 24 hour  Intake    720 ml  Output   2750 ml  Net  -2030 ml    Telemetry reveals sinus rhythm  GEN- The patient is overweight appearing, alert and oriented x 3 today.   Head- normocephalic, atraumatic Eyes-  Sclera clear, conjunctiva pink Ears- hearing intact Oropharynx- clear Neck- supple, no JVP Lymph- no cervical lymphadenopathy Lungs- Clear to ausculation bilaterally, normal work of breathing Heart- Regular rate and rhythm, no murmurs, rubs or gallops, PMI not laterally displaced GI- soft, NT, ND, + BS Extremities- no clubbing, cyanosis, or edema Skin- no rash or lesion Psych- euthymic mood, full affect Neuro- subjective numbness of R hand and forearm, strength is preserved  LABS: Basic Metabolic Panel:  Basename 10/20/11 0501 10/19/11 0200  NA 136 130*  K 4.3 4.0  CL 101 97  CO2 26 26  GLUCOSE 136* 280*  BUN 9 9  CREATININE 0.70 0.53    CALCIUM 9.0 8.6  MG 2.1 --  PHOS -- --   Liver Function Tests:  Basename 10/19/11 0200 10/18/11 2135  AST 13 20  ALT 17 19  ALKPHOS 78 82  BILITOT 0.4 0.4  PROT 6.7 7.1  ALBUMIN 3.2* 3.5   No results found for this basename: LIPASE:2,AMYLASE:2 in the last 72 hours CBC:  Basename 10/19/11 0200 10/18/11 2135  WBC 9.6 10.5  NEUTROABS -- 8.0*  HGB 14.6 15.5  HCT 42.6 44.1  MCV 87.7 87.0  PLT 246 266   Cardiac Enzymes:  Basename 10/19/11 1730 10/19/11 1010 10/19/11 0200  CKTOTAL 42 47 45  CKMB 2.8 2.8 2.9  CKMBINDEX -- -- --  TROPONINI <0.30 <0.30 <0.30   BNP: No components found with this basename: POCBNP:3 D-Dimer: No results found for this basename: DDIMER:2 in the last 72 hours Hemoglobin A1C:  Basename 10/19/11 0200  HGBA1C 9.9*10.0*   Fasting Lipid Panel:  Basename 10/19/11 0200  CHOL 168  HDL 30*  LDLCALC 111*  TRIG 135  CHOLHDL 5.6  LDLDIRECT --   Thyroid Function Tests:  Basename 10/19/11 0200  TSH 1.722  T4TOTAL --  T3FREE --  THYROIDAB --   Anemia Panel: No results found for  this basename: VITAMINB12,FOLATE,FERRITIN,TIBC,IRON,RETICCTPCT in the last 72 hours  RADIOLOGY: Dg Chest 2 View  10/18/2011  *RADIOLOGY REPORT*  Clinical Data: Chest tightness  CHEST - 2 VIEW  Comparison: 10/10/2011  Findings: Mild cardiomegaly.  Vascular clips in the left upper abdomen.  Relatively low lung volumes.  Some improvement in interstitial edema since previous exam.  No effusion.  Mild compression deformities in the lower thoracic spine   stable.  IMPRESSION:  1.  Cardiomegaly with   improvement in interstitial edema since previous exam.  Original Report Authenticated By: Trecia Rogers, M.D.   Ct Head Wo Contrast  10/18/2011  *RADIOLOGY REPORT*  Clinical Data: Dizziness, confusion, hypertension  CT HEAD WITHOUT CONTRAST  Technique:  Contiguous axial images were obtained from the base of the skull through the vertex without contrast.  Comparison:  10/10/2011 and earlier studies  Findings: Stable bilateral basal ganglia and thalamic mineralization.  Mild atrophy.  Small left caudate lacunar infarct, stable. There is no evidence of acute intracranial hemorrhage, brain edema, mass lesion, acute infarction,   mass effect, or midline shift. Acute infarct may be inapparent on noncontrast CT. No other intra-axial abnormalities are seen, and the ventricles and sulci are within normal limits in size and symmetry.   No abnormal extra-axial fluid collections or masses are identified.  No significant calvarial abnormality.  IMPRESSION: 1. Negative for bleed or other acute intracranial process.  Original Report Authenticated By: Trecia Rogers, M.D.   Ct Head Wo Contrast  10/10/2011  *RADIOLOGY REPORT*  Clinical Data: Headache and altered mental status.  CT HEAD WITHOUT CONTRAST  Technique:  Contiguous axial images were obtained from the base of the skull through the vertex without contrast.  Comparison: 08/25/2010  Findings: Diffuse cerebral atrophy.  Mild ventricular dilatation consistent with central atrophy.  Low attenuation changes in the deep white matter consistent with small vessel ischemic change. Focal areas of hyperintensity in the basal ganglia and thalami bilaterally are stable since the prior study and likely represent calcification.  No mass effect or midline shift.  No abnormal extra- axial fluid collections.  Gray-white matter junctions are distinct. Basal cisterns are not effaced.  No evidence of acute intracranial hemorrhage.  Opacification of some of the ethmoid air cells. Paranasal sinuses are otherwise not opacified.  Mastoid air cells are not opacified.  No depressed skull fractures.  Stable appearance since previous study.  IMPRESSION: Chronic atrophy and small vessel ischemic changes.  A mild opacification of some ethmoid air cells.  No evidence of acute intracranial hemorrhage, mass lesion, or acute infarct.  Original Report  Authenticated By: Neale Burly, M.D.   Dg Chest Port 1 View  10/10/2011  *RADIOLOGY REPORT*  Clinical Data: Chest pain and shortness of breath  PORTABLE CHEST - 1 VIEW  Comparison: 08/25/2010  Findings: Shallow inspiration with elevation of left hemidiaphragm. Heart size and pulmonary vascularity appear prominent.  Hazy perihilar interstitial opacities bilaterally suggesting mild edema or interstitial fibrosis.  Scattered calcified granulomas in the lungs.  Calcification of the aorta.  No blunting of costophrenic angles.  No pneumothorax.  IMPRESSION: Mild cardiac enlargement with increased pulmonary vascularity and hazy opacities suggesting mild edema.  Original Report Authenticated By: Neale Burly, M.D.    ASSESSMENT AND PLAN:  Principal Problem:  *Chest pain Active Problems:  DM (diabetes mellitus) with complications  OSA (obstructive sleep apnea)  Hypertension  Dizziness  1.  Chest pain- somewhat atypical in nature,  CMs are negative x 3 Would plan  myoview at [facility] today.  If low risk, medical therapy is advised Echo is pending  2.  HTN- above goal Increase ace inhibitor  3. NSVT/PVCs- asymptomatic, no changes  4. R hand/arm numbness- per Primary team  Thompson Grayer, MD 10/20/2011 8:43 AM

## 2011-10-21 ENCOUNTER — Inpatient Hospital Stay (HOSPITAL_COMMUNITY): Payer: Medicare Other

## 2011-10-21 DIAGNOSIS — G4733 Obstructive sleep apnea (adult) (pediatric): Secondary | ICD-10-CM

## 2011-10-21 MED ORDER — TECHNETIUM TC 99M TETROFOSMIN IV KIT
30.0000 | PACK | Freq: Once | INTRAVENOUS | Status: AC | PRN
  Administered 2011-10-20: 30 via INTRAVENOUS

## 2011-10-21 MED ORDER — INSULIN GLARGINE 100 UNIT/ML ~~LOC~~ SOLN
50.0000 [IU] | Freq: Every day | SUBCUTANEOUS | Status: DC
  Administered 2011-10-21 – 2011-10-22 (×2): 50 [IU] via SUBCUTANEOUS

## 2011-10-21 MED ORDER — TECHNETIUM TC 99M TETROFOSMIN IV KIT
30.0000 | PACK | Freq: Once | INTRAVENOUS | Status: AC | PRN
  Administered 2011-10-21: 30 via INTRAVENOUS

## 2011-10-21 MED ORDER — NITROGLYCERIN 0.4 MG SL SUBL: 0.4000 mg | SUBLINGUAL_TABLET | SUBLINGUAL | Status: DC | PRN

## 2011-10-21 NOTE — Progress Notes (Addendum)
During movement from CT table to patient bed, patients HR in the 250's, then dropped to high 30's.  Patient's eyes opened and non-responsive for about six seconds.  Patients upper lips quivering.  Patient unable to recall event.  Patient having difficulty following 6 cardinal points.  Per [facility], patient had a similar episode during transport.   Made MD aware.  MD requesting transfer to step-down.

## 2011-10-21 NOTE — Progress Notes (Signed)
Subjective: Five minutes of chest pain like a pressure with R arm radiation last pm, entire R arm remains numb  Objective: Vital signs in last 24 hours: Temp:  [97.3 F (36.3 C)-98.2 F (36.8 C)] 97.8 F (36.6 C) (02/10 0547) Pulse Rate:  [68-96] 68  (02/10 0547) Resp:  [18] 18  (02/10 0547) BP: (128-164)/(66-86) 131/79 mmHg (02/10 0547) SpO2:  [96 %-97 %] 97 % (02/10 0547) Weight change:  Last BM Date: 10/21/11  Intake/Output from previous day: 02/09 0701 - 02/10 0700 In: 460 [P.O.:460] Out: 1950 [Urine:1950] Intake/Output this shift:    General appearance: alert Resp: clear to auscultation bilaterally Cardio: regular rate and rhythm, S1, S2 normal, no murmur, click, rub or gallop  Lab Results:  Basename 10/19/11 0200 10/18/11 2135  WBC 9.6 10.5  HGB 14.6 15.5  HCT 42.6 44.1  PLT 246 266   BMET  Basename 10/20/11 0501 10/19/11 0200  NA 136 130*  K 4.3 4.0  CL 101 97  CO2 26 26  GLUCOSE 136* 280*  BUN 9 9  CREATININE 0.70 0.53  CALCIUM 9.0 8.6    Studies/Results: No results found.  Medications: I have reviewed the patient's current medications.  Assessment/Plan: Principal Problem:  *Chest pain  Another short episode last PM, complete lexiscan today, continue medical therapy Active Problems:  DM (diabetes mellitus) with complications  CBG not well controlled, increase Lantus  Arm Numbness, repeat CT brain to eval for CVA  Hypertension OK  Dizziness  Not orthostatic   LOS: 3 days   Jenia Klepper JOSEPH 10/21/2011, 8:27 AM

## 2011-10-21 NOTE — Progress Notes (Signed)
SUBJECTIVE: The patient is doing well today. He had right sided chest pressure with right arm pain yesterday.  He continues to have R hand numbness.     Marland Kitchen aspirin  300 mg Rectal Daily   Or  . aspirin  325 mg Oral Daily  . enoxaparin  40 mg Subcutaneous Q24H  . glimepiride  4 mg Oral QAC lunch  . insulin aspart  0-20 Units Subcutaneous TID WC  . insulin aspart  6 Units Subcutaneous TID WC  . insulin glargine  20 Units Subcutaneous Daily  . pantoprazole  40 mg Oral Q1200  . ramipril  10 mg Oral BID  . DISCONTD: insulin aspart protamine-insulin aspart  55 Units Subcutaneous BID WC  . DISCONTD: insulin glargine  20 Units Subcutaneous QHS  . DISCONTD: ramipril  10 mg Oral Daily      OBJECTIVE: Physical Exam: Filed Vitals:   10/20/11 0549 10/20/11 1500 10/20/11 2039 10/21/11 0547  BP: 150/87 153/86 128/79 131/79  Pulse: 73 83 71 68  Temp: 98.7 F (37.1 C) 98.2 F (36.8 C) 97.3 F (36.3 C) 97.8 F (36.6 C)  TempSrc: Oral Oral Oral Oral  Resp: 18  18 18   Height:      Weight:      SpO2: 100% 97% 96% 97%    Intake/Output Summary (Last 24 hours) at 10/21/11 0827 Last data filed at 10/21/11 [id number]  Gross per 24 hour  Intake    460 ml  Output   1950 ml  Net  -1490 ml    Telemetry reveals sinus rhythm, NSVT  GEN- The patient is overweight appearing, alert and oriented x 3 today.   Head- normocephalic, atraumatic Eyes-  Sclera clear, conjunctiva pink Ears- hearing intact Oropharynx- clear Neck- supple, no JVP Lymph- no cervical lymphadenopathy Lungs- Clear to ausculation bilaterally, normal work of breathing Heart- Regular rate and rhythm, no murmurs, rubs or gallops, PMI not laterally displaced GI- soft, NT, ND, + BS Extremities- no clubbing, cyanosis, or edema Skin- no rash or lesion Psych- euthymic mood, full affect Neuro- subjective numbness of R hand and forearm, strength is preserved  LABS: Basic Metabolic Panel:  Basename 10/20/11 0501 10/19/11 0200  NA 136  130*  K 4.3 4.0  CL 101 97  CO2 26 26  GLUCOSE 136* 280*  BUN 9 9  CREATININE 0.70 0.53  CALCIUM 9.0 8.6  MG 2.1 --  PHOS -- --   Liver Function Tests:  Basename 10/19/11 0200 10/18/11 2135  AST 13 20  ALT 17 19  ALKPHOS 78 82  BILITOT 0.4 0.4  PROT 6.7 7.1  ALBUMIN 3.2* 3.5   No results found for this basename: LIPASE:2,AMYLASE:2 in the last 72 hours CBC:  Basename 10/19/11 0200 10/18/11 2135  WBC 9.6 10.5  NEUTROABS -- 8.0*  HGB 14.6 15.5  HCT 42.6 44.1  MCV 87.7 87.0  PLT 246 266   Cardiac Enzymes:  Basename 10/19/11 1730 10/19/11 1010 10/19/11 0200  CKTOTAL 42 47 45  CKMB 2.8 2.8 2.9  CKMBINDEX -- -- --  TROPONINI <0.30 <0.30 <0.30   BNP: No components found with this basename: POCBNP:3 D-Dimer: No results found for this basename: DDIMER:2 in the last 72 hours Hemoglobin A1C:  Basename 10/19/11 0200  HGBA1C 9.9*10.0*   Fasting Lipid Panel:  Basename 10/19/11 0200  CHOL 168  HDL 30*  LDLCALC 111*  TRIG 135  CHOLHDL 5.6  LDLDIRECT --   Thyroid Function Tests:  Basename 10/19/11 0200  TSH 1.722  T4TOTAL --  T3FREE --  THYROIDAB --   Anemia Panel: No results found for this basename: VITAMINB12,FOLATE,FERRITIN,TIBC,IRON,RETICCTPCT in the last 72 hours  ASSESSMENT AND PLAN:  Principal Problem:  *Chest pain Active Problems:  DM (diabetes mellitus) with complications  OSA (obstructive sleep apnea)  Hypertension  Dizziness  1.  Chest pain- somewhat atypical in nature,  CMs are negative x 3 Day #2 of 2 day [facility]iew at [facility] today.  If low risk, medical therapy is advised  Echo reveals severe biatrial enlargement with moderate RV dilation  2.  HTN- stable with increased ace inhibitor  3. NSVT/PVCs- asymptomatic, no changes  4. R hand/arm numbness- per Primary team  Thompson Grayer, MD 10/21/2011 8:27 AM

## 2011-10-22 LAB — BASIC METABOLIC PANEL
BUN: 10 mg/dL (ref 6–23)
CO2: 29 mEq/L (ref 19–32)
Glucose, Bld: 166 mg/dL — ABNORMAL HIGH (ref 70–99)
Potassium: 4 mEq/L (ref 3.5–5.1)
Sodium: 138 mEq/L (ref 135–145)

## 2011-10-22 LAB — GLUCOSE, CAPILLARY
Glucose-Capillary: 211 mg/dL — ABNORMAL HIGH (ref 70–99)
Glucose-Capillary: 251 mg/dL — ABNORMAL HIGH (ref 70–99)
Glucose-Capillary: 269 mg/dL — ABNORMAL HIGH (ref 70–99)

## 2011-10-22 NOTE — Progress Notes (Addendum)
PT Cancellation Note  __X_Treatment cancelled today due to medical issues with patient which prohibited therapy---RN states to wait until Cardiology sees pt (see MD note regarding "episodes" of 10/21/11)  ___ Treatment cancelled today due to patient receiving procedure or test   ___ Treatment cancelled today due to patient's refusal to participate   ___ Treatment cancelled today due to

## 2011-10-22 NOTE — Clinical Documentation Improvement (Signed)
BMI DOCUMENTATION CLARIFICATION QUERY  THIS DOCUMENT IS NOT A PERMANENT PART OF THE MEDICAL RECORD  TO RESPOND TO THE THIS QUERY, FOLLOW THE INSTRUCTIONS BELOW:  1. If needed, update documentation for the patient's encounter via the notes activity.  2. Access this query again and click edit on the In [employer].  3. After updating, or not, click F2 to complete all highlighted (required) fields concerning your review. Select "additional documentation in the medical record" OR "no additional documentation provided".  4. Click Sign note button.  5. The deficiency will fall out of your In Basket *Please let us know if you are not able to complete this workflow by phone or e-mail (listed below).         10/22/11  Dear Dr.GRIFFIN, Lenna Sciara  Rolley Sims  In an effort to better capture your patient's severity of illness, reflect appropriate length of stay and utilization of resources, a review of the patient medical record has revealed the following indicators.    Based on your clinical judgment, please clarify and document in a progress note and/or discharge summary the clinical condition associated with the following supporting information:  In responding to this query please exercise your independent judgment.  The fact that a query is asked, does not imply that any particular answer is desired or expected.  Pt's BMI= 49.9lbs. Please clarify whether or not BMI can be linked to one of he diagnoses listed below and document in pn  and d/c. Thank You!  [facility]ACT[facility]: When linking BMI to a diagnosis please document both BMI and diagnosis together in pn for accuracy of SOI and ROM.    Possible Clinical conditions  Morbid Obesity W/ BMI=   Underweight w/BMI=  Other condition___________________  Cannot Clinically determine _____________  Risk Factors: CP r/o ACS, r/o CVA, DM2, HTN  Sign & Symptoms: BMI-49.9 6'11"/377 lbs  Treatment Monitoring  Medications:    Reviewed:  no  additional documentation provided 2/12/2013ljh  Thank You,  Heloise Beecham RN, BSN, CCDS Clinical Documentation Specialist [facility] Dept Pager: [phone] / E-mail: [email or web address]

## 2011-10-22 NOTE — Progress Notes (Signed)
Inpatient Diabetes Program Recommendations  AACE/ADA: New Consensus Statement on Inpatient Glycemic Control (2009)  Target Ranges:  Prepandial:   less than 140 mg/dL      Peak postprandial:   less than 180 mg/dL (1-2 hours)      Critically ill patients:  140 - 180 mg/dL   Reason for Visit: Hyperglycemia Results for [name], [name] (MRN [id number]) as of 10/22/2011 14:47  Ref. Range 10/21/2011 07:51 10/21/2011 11:13 10/21/2011 16:43 10/22/2011 07:12 10/22/2011 08:38 10/22/2011 11:17  Glucose-Capillary Latest Range: 70-99 mg/dL 195 (H) 218 (H) 217 (H) 178 (H) 211 (H) 251 (H)     Inpatient Diabetes Program Recommendations Insulin - Basal: Increase Lantus to 60 units QD Correction (SSI): Add Novolog resistant tidwc and hs Insulin - Meal Coverage: Increase Novolog to 10 units tidwc for meal coverage insulin Outpatient Referral: OP Diabetes Education consult for uncontrolled DM ( HgbA1C - 10.1%)

## 2011-10-22 NOTE — Progress Notes (Signed)
Subjective: 2 episodes on unresponsiveness yesterday, one while on way to [place] in ambulance, the other while being transferred from [place] to [place], [name] says HR dropped.  Another 15 min episode of chest pressure with R arm pain last pm  Objective: Vital signs in last 24 hours: Temp:  [96.9 F (36.1 C)-97.6 F (36.4 C)] 96.9 F (36.1 C) (02/11 0430) Pulse Rate:  [70-80] 79  (02/11 0430) Resp:  [14-20] 14  (02/11 0430) BP: (129-157)/(55-83) 151/83 mmHg (02/11 0430) SpO2:  [97 %-100 %] 100 % (02/11 0430) Weight:  [171.278 kg (377 lb 9.6 oz)] 171.278 kg (377 lb 9.6 oz) (02/10 1330) Weight change:  Last BM Date: 10/21/11  Intake/Output from previous day: 02/10 0701 - 02/11 0700 In: 1200 [P.O.:1200] Out: 1675 [Urine:1675] Intake/Output this shift: Total I/O In: 480 [P.O.:480] Out: 1200 [Urine:1200]  General appearance: alert and cooperative Neck: no adenopathy, no carotid bruit, no JVD, supple, symmetrical, trachea midline and thyroid not enlarged, symmetric, no tenderness/mass/nodules Resp: clear to auscultation bilaterally Cardio: regular rate and rhythm, S1, S2 normal, no murmur, click, rub or gallop Extremities: extremities normal, atraumatic, no cyanosis or edema  Lab Results: No results found for this basename: WBC:2,HGB:2,HCT:2,PLT:2 in the last 72 hours BMET  Basename 10/22/11 0310 10/20/11 0501  NA 138 136  K 4.0 4.3  CL 102 101  CO2 29 26  GLUCOSE 166* 136*  BUN 10 9  CREATININE 0.72 0.70  CALCIUM 9.0 9.0    Studies/Results: Ct Head Wo Contrast  10/21/2011  *RADIOLOGY REPORT*  Clinical Data: Arm numbness, evaluate for CVA  CT HEAD WITHOUT CONTRAST  Technique:  Contiguous axial images were obtained from the base of the skull through the vertex without contrast.  Comparison: 10/18/2011  Findings: No evidence of parenchymal hemorrhage or extra-axial fluid collection. No mass lesion, mass effect, or midline shift.  No CT evidence of acute infarction.  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  Mild subcortical white matter and periventricular small vessel ischemic changes.  Mild global cortical atrophy.  No ventriculomegaly.  The visualized paranasal sinuses are essentially clear. The mastoid air cells are unopacified.  No evidence of calvarial fracture.  IMPRESSION: No evidence of acute intracranial abnormality.  Mild atrophy with small vessel ischemic changes.  Original Report Authenticated By: Julian Hy, M.D.   Nm Myocar Multi W/spect W/wall Motion / Ef  10/21/2011  *RADIOLOGY REPORT*  Clinical Data:  Chest pain, hypertension and shortness of breath.  MYOCARDIAL IMAGING WITH SPECT (REST AND PHARMACOLOGIC-STRESS - 2 DAY PROTOCOL) GATED LEFT VENTRICULAR WALL MOTION STUDY LEFT VENTRICULAR EJECTION FRACTION  Technique:  Standard myocardial SPECT imaging was performed after intravenous injection of 32 mCi Tc-73m tetrofosmin at rest.  On a different day, intravenous infusion of  Lexiscan was performed under supervision of the Cardiology staff.  At peak effect of the drug, 33 mCi Tc-76m tetrofosmin was injected intravenously and standard myocardial SPECT imaging was performed.  Quantitative gated imaging was also performed to evaluate left ventricular wall motion and estimate left ventricular ejection fraction.  Comparison:  None.  Findings: Utilizing gated data, the end-diastolic volume is estimated at 208 ml and the end-systolic volume 123XX123 ml.  Calculated ejection fraction is 44%.  Wall motion analysis demonstrates no focal wall motion abnormality.  SPECT images show inferior wall attenuation on both stress and rest acquisitions.  When reviewing the raw planar imaging, this is felt to be most likely on the basis of diaphragmatic attenuation and convergence of the intents hepatic activity onto  the inferior wall. A component of inferior wall scar cannot be excluded.  A small focal area of the potential ischemia is present by SPECT images in the distal anteroseptal wall.  No  other evidence to suggest inducible ischemia.  IMPRESSION:  1.  Mildly depressed quantitative left ventricular ejection fraction of 44% without significant focal wall motion abnormalities. 2.  Possible small focal area of ischemia in the distal anteroseptal wall. 3.  Inferior wall attenuation which is likely attributable to diaphragmatic attenuation and visible intense activity in the liver on both rest and stress acquisitions.  A component of inferior wall scar cannot be excluded.  Original Report Authenticated By: Azzie Roup, M.D.    Medications: I have reviewed the patient's current medications.  Assessment/Plan: Principal Problem:  *Chest pain cardiolite noted does not appear to be high risk study but concerned still  Having chest pain and 2 episodes of unresponsiveness yesterday.  Follow on telemetry, will await cardiology input Active Problems:  R arm numbness  Repeat CT no CVA. Could have cervical radiculopathy but would not explain facial weakness  DM (diabetes mellitus) with complications continue lantus and SSI  Hypertension     LOS: 4 days   [name] 10/22/2011, 6:38 AM

## 2011-10-23 ENCOUNTER — Ambulatory Visit (HOSPITAL_COMMUNITY): Admit: 2011-10-23 | Payer: Medicare Other

## 2011-10-23 ENCOUNTER — Encounter (HOSPITAL_COMMUNITY): Payer: Medicare Other

## 2011-10-23 ENCOUNTER — Encounter (HOSPITAL_COMMUNITY)
Admission: EM | Disposition: A | Discharge status: Home Health | Payer: Self-pay | Source: Home / Self Care | Attending: Internal Medicine

## 2011-10-23 LAB — PLATELET INHIBITION P2Y12: Platelet Function  P2Y12: 262 [PRU] (ref 194–418)

## 2011-10-23 LAB — BASIC METABOLIC PANEL
BUN: 13 mg/dL (ref 6–23)
CO2: 28 mEq/L (ref 19–32)
Chloride: 100 mEq/L (ref 96–112)
Creatinine, Ser: 0.79 mg/dL (ref 0.50–1.35)
Potassium: 4 mEq/L (ref 3.5–5.1)

## 2011-10-23 LAB — CBC
Hemoglobin: 15 g/dL (ref 13.0–17.0)
MCH: 29.8 pg (ref 26.0–34.0)
RBC: 5.03 MIL/uL (ref 4.22–5.81)
WBC: 7.8 10*3/uL (ref 4.0–10.5)

## 2011-10-23 LAB — GLUCOSE, CAPILLARY
Glucose-Capillary: 215 mg/dL — ABNORMAL HIGH (ref 70–99)
Glucose-Capillary: 111 mg/dL — ABNORMAL HIGH (ref 70–99)
Glucose-Capillary: 218 mg/dL — ABNORMAL HIGH (ref 70–99)

## 2011-10-23 LAB — PROTIME-INR: Prothrombin Time: 13 seconds (ref 11.6–15.2)

## 2011-10-23 SURGERY — LEFT HEART CATHETERIZATION WITH CORONARY ANGIOGRAM
Anesthesia: LOCAL

## 2011-10-23 MED ORDER — DIAZEPAM 5 MG PO TABS: 5.0000 mg | ORAL_TABLET | ORAL | Status: DC

## 2011-10-23 MED ORDER — SODIUM CHLORIDE 0.9 % IV SOLN: 250.0000 mL | INTRAVENOUS | Status: DC | PRN

## 2011-10-23 MED ORDER — INSULIN GLARGINE 100 UNIT/ML ~~LOC~~ SOLN
30.0000 [IU] | Freq: Every day | SUBCUTANEOUS | Status: DC
  Administered 2011-10-23 – 2011-10-24 (×2): 30 [IU] via SUBCUTANEOUS
  Filled 2011-10-23: qty 3

## 2011-10-23 MED ORDER — SODIUM CHLORIDE 0.9 % IJ SOLN
3.0000 mL | Freq: Two times a day (BID) | INTRAMUSCULAR | Status: DC
  Administered 2011-10-23 – 2011-10-24 (×2): 3 mL via INTRAVENOUS

## 2011-10-23 MED ORDER — SODIUM CHLORIDE 0.9 % IJ SOLN: 3.0000 mL | INTRAMUSCULAR | Status: DC | PRN

## 2011-10-23 MED ORDER — INSULIN ASPART 100 UNIT/ML ~~LOC~~ SOLN
10.0000 [IU] | Freq: Three times a day (TID) | SUBCUTANEOUS | Status: DC
Start: 2011-10-23 — End: 2011-10-29
  Administered 2011-10-23 – 2011-10-29 (×18): 10 [IU] via SUBCUTANEOUS
  Filled 2011-10-23: qty 3

## 2011-10-23 MED ORDER — SODIUM CHLORIDE 0.9 % IV SOLN
INTRAVENOUS | Status: DC
  Administered 2011-10-24: 04:00:00 via INTRAVENOUS

## 2011-10-23 MED ORDER — DIAZEPAM 5 MG PO TABS
5.0000 mg | ORAL_TABLET | ORAL | Status: DC
  Administered 2011-10-24: 5 mg via ORAL

## 2011-10-23 MED ORDER — INSULIN GLARGINE 100 UNIT/ML ~~LOC~~ SOLN
30.0000 [IU] | Freq: Once | SUBCUTANEOUS | Status: AC
  Administered 2011-10-23: 30 [IU] via SUBCUTANEOUS

## 2011-10-23 MED ORDER — ASPIRIN 81 MG PO CHEW
324.0000 mg | CHEWABLE_TABLET | ORAL | Status: AC
  Administered 2011-10-24: 324 mg via ORAL
  Filled 2011-10-23 (×2): qty 4

## 2011-10-23 NOTE — Progress Notes (Signed)
Subjective: No further chest pain.  R hand and arm still numb.  Telemetry unremarkable  Objective: Vital signs in last 24 hours: Temp:  [97.4 F (36.3 C)-98 F (36.7 C)] 97.6 F (36.4 C) (02/12 0500) Pulse Rate:  [66-86] 67  (02/12 0500) Resp:  [13-21] 20  (02/12 0000) BP: (116-151)/(55-94) 116/94 mmHg (02/12 0500) SpO2:  [96 %-99 %] 96 % (02/12 0500) Weight:  [174.5 kg (384 lb 11.2 oz)] 174.5 kg (384 lb 11.2 oz) (02/12 0500) Weight change: 3.222 kg (7 lb 1.6 oz) Last BM Date: 10/21/11  Intake/Output from previous day: 02/11 0701 - 02/12 0700 In: 2040 [P.O.:2040] Out: 2200 [Urine:2200] Intake/Output this shift:    General appearance: alert and cooperative Resp: clear to auscultation bilaterally Cardio: regular rate and rhythm, S1, S2 normal, no murmur, click, rub or gallop GI: soft, non-tender; bowel sounds normal; no masses,  no organomegaly Extremities: edema trace  Lab Results: No results found for this basename: WBC:2,HGB:2,HCT:2,PLT:2 in the last 72 hours BMET  [facility] 10/22/11 0310  NA 138  K 4.0  CL 102  CO2 29  GLUCOSE 166*  BUN 10  CREATININE 0.72  CALCIUM 9.0    Studies/Results: Ct Head Wo Contrast  10/21/2011  *RADIOLOGY REPORT*  Clinical Data: Arm numbness, evaluate for CVA  CT HEAD WITHOUT CONTRAST  Technique:  Contiguous axial images were obtained from the base of the skull through the vertex without contrast.  Comparison: 10/18/2011  Findings: No evidence of parenchymal hemorrhage or extra-axial fluid collection. No mass lesion, mass effect, or midline shift.  No CT evidence of acute infarction.  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  Mild subcortical white matter and periventricular small vessel ischemic changes.  Mild global cortical atrophy.  No ventriculomegaly.  The visualized paranasal sinuses are essentially clear. The mastoid air cells are unopacified.  No evidence of calvarial fracture.  IMPRESSION: No evidence of acute intracranial abnormality.   Mild atrophy with small vessel ischemic changes.  Original Report Authenticated By: Julian Hy, M.D.   Nm Myocar Multi W/spect W/wall Motion / Ef  10/21/2011  *RADIOLOGY REPORT*  Clinical Data:  Chest pain, hypertension and shortness of breath.  MYOCARDIAL IMAGING WITH SPECT (REST AND PHARMACOLOGIC-STRESS - 2 DAY PROTOCOL) GATED LEFT VENTRICULAR WALL MOTION STUDY LEFT VENTRICULAR EJECTION FRACTION  Technique:  Standard myocardial SPECT imaging was performed after intravenous injection of 32 mCi Tc-60m tetrofosmin at rest.  On a different day, intravenous infusion of  Lexiscan was performed under supervision of the Cardiology staff.  At peak effect of the drug, 33 mCi Tc-82m tetrofosmin was injected intravenously and standard myocardial SPECT imaging was performed.  Quantitative gated imaging was also performed to evaluate left ventricular wall motion and estimate left ventricular ejection fraction.  Comparison:  None.  Findings: Utilizing gated data, the end-diastolic volume is estimated at 208 ml and the end-systolic volume 123XX123 ml.  Calculated ejection fraction is 44%.  Wall motion analysis demonstrates no focal wall motion abnormality.  SPECT images show inferior wall attenuation on both stress and rest acquisitions.  When reviewing the raw planar imaging, this is felt to be most likely on the basis of diaphragmatic attenuation and convergence of the intents hepatic activity onto the inferior wall. A component of inferior wall scar cannot be excluded.  A small focal area of the potential ischemia is present by SPECT images in the distal anteroseptal wall.  No other evidence to suggest inducible ischemia.  IMPRESSION:  1.  Mildly depressed quantitative left ventricular ejection fraction  of 44% without significant focal wall motion abnormalities. 2.  Possible small focal area of ischemia in the distal anteroseptal wall. 3.  Inferior wall attenuation which is likely attributable to diaphragmatic attenuation  and visible intense activity in the liver on both rest and stress acquisitions.  A component of inferior wall scar cannot be excluded.  Original Report Authenticated By: Azzie Roup, M.D.    Medications: I have reviewed the patient's current medications.  Assessment/Plan: Principal Problem:  *Chest pain  No recurrence last 24 hrs. Will discuss further plans with cardiology Active Problems:  DM (diabetes mellitus) with complications  Will adjust lantus and premeal insulin, not controlled  Hypertension ok  Dizziness resolved  Disposition  OK to proceed with PT   LOS: 5 days   [name] JOSEPH 10/23/2011, 7:10 AM

## 2011-10-23 NOTE — Progress Notes (Signed)
SUBJECTIVE:  No further chest pain.  Complains of continued right had and arm numbness  OBJECTIVE:   Vitals:   Filed Vitals:   10/23/11 0906 10/23/11 1125 10/23/11 1134 10/23/11 1140  BP: 141/69 143/71 134/72 141/78  Pulse: 71 79 100 102  Temp:      TempSrc:      Resp: 15 14 24 22   Height:      Weight:      SpO2: 98% 98% 99% 97%   I&O's:   Intake/Output Summary (Last 24 hours) at 10/23/11 1432 Last data filed at 10/23/11 1300  Gross per 24 hour  Intake   2120 ml  Output   2500 ml  Net   -380 ml   TELEMETRY: Reviewed telemetry pt in NSR     PHYSICAL EXAM General: Well developed, well nourished, in no acute distress Head: Eyes PERRLA, No xanthomas.   Normal cephalic and atramatic  Lungs:   Clear bilaterally to auscultation and percussion. Heart:   HRRR S1 S2 Pulses are 2+ & equal.            No carotid bruit. No JVD.  No abdominal bruits. No femoral bruits. Abdomen: Bowel sounds are positive, abdomen soft and non-tender without masses  Extremities:   No clubbing, cyanosis or edema.  DP +1 Neuro: Alert and oriented X 3. Psych:  Good affect, responds appropriately   LABS: Basic Metabolic Panel:  Basename 10/22/11 0310  NA 138  K 4.0  CL 102  CO2 29  GLUCOSE 166*  BUN 10  CREATININE 0.72  CALCIUM 9.0  MG --  PHOS --   Coag Panel:   Lab Results  Component Value Date   INR 0.99 10/18/2011   INR 1.03 10/10/2011   INR 1.08 08/26/2010    RADIOLOGY: Dg Chest 2 View  10/18/2011  *RADIOLOGY REPORT*  Clinical Data: Chest tightness  CHEST - 2 VIEW  Comparison: 10/10/2011  Findings: Mild cardiomegaly.  Vascular clips in the left upper abdomen.  Relatively low lung volumes.  Some improvement in interstitial edema since previous exam.  No effusion.  Mild compression deformities in the lower thoracic spine   stable.  IMPRESSION:  1.  Cardiomegaly with   improvement in interstitial edema since previous exam.  Original Report Authenticated By: Trecia Rogers, M.D.   Ct  Head Wo Contrast  10/21/2011  *RADIOLOGY REPORT*  Clinical Data: Arm numbness, evaluate for CVA  CT HEAD WITHOUT CONTRAST  Technique:  Contiguous axial images were obtained from the base of the skull through the vertex without contrast.  Comparison: 10/18/2011  Findings: No evidence of parenchymal hemorrhage or extra-axial fluid collection. No mass lesion, mass effect, or midline shift.  No CT evidence of acute infarction.  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  Mild subcortical white matter and periventricular small vessel ischemic changes.  Mild global cortical atrophy.  No ventriculomegaly.  The visualized paranasal sinuses are essentially clear. The mastoid air cells are unopacified.  No evidence of calvarial fracture.  IMPRESSION: No evidence of acute intracranial abnormality.  Mild atrophy with small vessel ischemic changes.  Original Report Authenticated By: Julian Hy, M.D.   Ct Head Wo Contrast  10/18/2011  *RADIOLOGY REPORT*  Clinical Data: Dizziness, confusion, hypertension  CT HEAD WITHOUT CONTRAST  Technique:  Contiguous axial images were obtained from the base of the skull through the vertex without contrast.  Comparison: 10/10/2011 and earlier studies  Findings: Stable bilateral basal ganglia and thalamic mineralization.  Mild atrophy.  Small  left caudate lacunar infarct, stable. There is no evidence of acute intracranial hemorrhage, brain edema, mass lesion, acute infarction,   mass effect, or midline shift. Acute infarct may be inapparent on noncontrast CT. No other intra-axial abnormalities are seen, and the ventricles and sulci are within normal limits in size and symmetry.   No abnormal extra-axial fluid collections or masses are identified.  No significant calvarial abnormality.  IMPRESSION: 1. Negative for bleed or other acute intracranial process.  Original Report Authenticated By: Trecia Rogers, M.D.   Ct Head Wo Contrast  10/10/2011  *RADIOLOGY REPORT*  Clinical Data:  Headache and altered mental status.  CT HEAD WITHOUT CONTRAST  Technique:  Contiguous axial images were obtained from the base of the skull through the vertex without contrast.  Comparison: 08/25/2010  Findings: Diffuse cerebral atrophy.  Mild ventricular dilatation consistent with central atrophy.  Low attenuation changes in the deep white matter consistent with small vessel ischemic change. Focal areas of hyperintensity in the basal ganglia and thalami bilaterally are stable since the prior study and likely represent calcification.  No mass effect or midline shift.  No abnormal extra- axial fluid collections.  Gray-white matter junctions are distinct. Basal cisterns are not effaced.  No evidence of acute intracranial hemorrhage.  Opacification of some of the ethmoid air cells. Paranasal sinuses are otherwise not opacified.  Mastoid air cells are not opacified.  No depressed skull fractures.  Stable appearance since previous study.  IMPRESSION: Chronic atrophy and small vessel ischemic changes.  A mild opacification of some ethmoid air cells.  No evidence of acute intracranial hemorrhage, mass lesion, or acute infarct.  Original Report Authenticated By: Neale Burly, M.D.   Nm Myocar Multi W/spect W/wall Motion / Ef  10/21/2011  *RADIOLOGY REPORT*  Clinical Data:  Chest pain, hypertension and shortness of breath.  MYOCARDIAL IMAGING WITH SPECT (REST AND PHARMACOLOGIC-STRESS - 2 DAY PROTOCOL) GATED LEFT VENTRICULAR WALL MOTION STUDY LEFT VENTRICULAR EJECTION FRACTION  Technique:  Standard myocardial SPECT imaging was performed after intravenous injection of 32 mCi Tc-65m tetrofosmin at rest.  On a different day, intravenous infusion of  Lexiscan was performed under supervision of the Cardiology staff.  At peak effect of the drug, 33 mCi Tc-35m tetrofosmin was injected intravenously and standard myocardial SPECT imaging was performed.  Quantitative gated imaging was also performed to evaluate left  ventricular wall motion and estimate left ventricular ejection fraction.  Comparison:  None.  Findings: Utilizing gated data, the end-diastolic volume is estimated at 208 ml and the end-systolic volume 123XX123 ml.  Calculated ejection fraction is 44%.  Wall motion analysis demonstrates no focal wall motion abnormality.  SPECT images show inferior wall attenuation on both stress and rest acquisitions.  When reviewing the raw planar imaging, this is felt to be most likely on the basis of diaphragmatic attenuation and convergence of the intents hepatic activity onto the inferior wall. A component of inferior wall scar cannot be excluded.  A small focal area of the potential ischemia is present by SPECT images in the distal anteroseptal wall.  No other evidence to suggest inducible ischemia.  IMPRESSION:  1.  Mildly depressed quantitative left ventricular ejection fraction of 44% without significant focal wall motion abnormalities. 2.  Possible small focal area of ischemia in the distal anteroseptal wall. 3.  Inferior wall attenuation which is likely attributable to diaphragmatic attenuation and visible intense activity in the liver on both rest and stress acquisitions.  A component of inferior wall scar  cannot be excluded.  Original Report Authenticated By: Azzie Roup, M.D.   Dg Chest Port 1 View  10/10/2011  *RADIOLOGY REPORT*  Clinical Data: Chest pain and shortness of breath  PORTABLE CHEST - 1 VIEW  Comparison: 08/25/2010  Findings: Shallow inspiration with elevation of left hemidiaphragm. Heart size and pulmonary vascularity appear prominent.  Hazy perihilar interstitial opacities bilaterally suggesting mild edema or interstitial fibrosis.  Scattered calcified granulomas in the lungs.  Calcification of the aorta.  No blunting of costophrenic angles.  No pneumothorax.  IMPRESSION: Mild cardiac enlargement with increased pulmonary vascularity and hazy opacities suggesting mild edema.  Original Report  Authenticated By: Neale Burly, M.D.      ASSESSMENT:  1.  Chest pain with negative cardiac markers.  Lexiscan cardiolyte with EF 44% and small focal area of ischemia in the distal anteroseptal wall - had recurrent chest pain yesterday.  2.  DM 3.  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  HTN 5.  NSVT/PVC's 6.  Mild LV dysfunction my nuclear stress test   PLAN:   1.  Plan cardiac cath in am by Dr. Marlou Porch via right femoral artery approach 2.  NPO after midnight.  Sueanne Margarita, MD  10/23/2011  2:32 PM

## 2011-10-23 NOTE — Evaluation (Signed)
Physical Therapy Evaluation Patient Details Name: [name] MRN: [id number] DOB: 1946-06-06 Today's Date: 10/23/2011  Problem List:  Patient Active Problem List  Diagnoses  . DM (diabetes mellitus) with complications  . Polyneuropathy in diabetes  . OSA (obstructive sleep apnea)  . Diabetic neuropathy, painful  . Painful bladder spasm  . Dehydration  . UTI (lower urinary tract infection)  . Hyperglycemia without ketosis  . Generalized headaches  . Hypertension  . Chest pain  . Dizziness    Past Medical History:  Past Medical History  Diagnosis Date  . Obese   . Hypertension   . Diabetes mellitus   . Diabetic diarrhea   . Sleep apnea   . Diabetic neuropathy, painful   . Chronic indwelling foley catheter   . Bladder spasms   . Complication of anesthesia   . PONV (postoperative nausea and vomiting)    Past Surgical History:  Past Surgical History  Procedure Date  . Joint replacement     Left Total Knee  . Gastroplasty   . Cholecystectomy     PT Assessment/Plan/Recommendation PT Assessment Clinical Impression Statement: Pt presents with chest pain and s/p left heart cath with coronary agiogram.  Pt continues to have dizziness episdes while in standing.  Pt became unable to follow commands or respond to therapist upon standing.  Pt able to transfer from bed to chair.  Blood pressure/heart rhythm/rate ramined stable throughout session.  Pt refusing to have therapy at home stating that he cannot afford it, however PT with major safety concerns at this time with current medical status.  Pt will benefit from skilled PT in the acute venue in order to address mobility/dizziness issues.  PT recommends short term SNF for follow up.   PT Recommendation/Assessment: Patient will need skilled PT in the acute care venue PT Problem List: Decreased activity tolerance;Decreased mobility;Decreased knowledge of use of DME;Decreased safety awareness;Obesity Barriers to Discharge:  Decreased caregiver support PT Therapy Diagnosis : Difficulty walking;Generalized weakness;Abnormality of gait PT Plan PT Frequency: Min 3X/week PT Treatment/Interventions: DME instruction;Gait training;Functional mobility training;Therapeutic activities;Therapeutic exercise;Patient/family education PT Recommendation Follow Up Recommendations: Skilled nursing facility Equipment Recommended:  (Bariatric RW if D/C home) PT Goals  Acute Rehab PT Goals PT Goal Formulation: With patient Time For Goal Achievement: 2 weeks Pt will go Supine/Side to Sit: Independently PT Goal: Supine/Side to Sit - Progress: Goal set today Pt will go Sit to Supine/Side: Independently PT Goal: Sit to Supine/Side - Progress: Goal set today Pt will go Sit to Stand: with modified independence PT Goal: Sit to Stand - Progress: Goal set today Pt will go Stand to Sit: with modified independence PT Goal: Stand to Sit - Progress: Goal set today Pt will Ambulate: 51 - 150 feet;with modified independence;with least restrictive assistive device PT Goal: Ambulate - Progress: Goal set today  PT Evaluation Precautions/Restrictions  Precautions Precautions: Fall Required Braces or Orthoses: No Restrictions Weight Bearing Restrictions: No Prior Functioning  Home Living Lives With: Alone Type of Home: House Home Layout: One level Home Access: Level entry Home Adaptive Equipment: Straight cane;Walker - standard Additional Comments: states that he has a "large" standard walker.  Prior Function Level of Independence: Independent with basic ADLs;Independent with gait;Independent with transfers Driving: Yes Comments: Pt states that he does not cook, but makes microwave meals and eats fast food.  Cognition Cognition Arousal/Alertness: Awake/alert Overall Cognitive Status: Appears within functional limits for tasks assessed Orientation Level: Oriented X4 Sensation/Coordination Sensation Light Touch: Impaired  Detail Light Touch  Impaired Details: Impaired RUE Additional Comments: Pt states that he has numbness in C5/C6 dermatome in hand/fingers, however not in C8 (5th digit). Then states he has numbness in C8 dermatome in forearm.  Coordination Gross Motor Movements are Fluid and Coordinated: Yes Extremity Assessment RLE Strength RLE Overall Strength Comments: WFL per functional assessment.  LLE Strength LLE Overall Strength Comments: WFL per functional assessment.  Mobility (including Balance) Bed Mobility Bed Mobility: Yes Supine to Sit: 5: Supervision Supine to Sit Details (indicate cue type and reason): Supervision for safety due to pt impulsivity with bed mobility.  Transfers Transfers: Yes Sit to Stand: 1: +2 Total assist;Patient percentage (comment);With upper extremity assist;From bed Sit to Stand Details (indicate cue type and reason): Pt assist 60%.  +2 for safety with cues for hand placement.  Performed x 2 due to pt stating he was "woozy" and with episode of pt being unable to follow commands (conscious but unresponsive).   Stand to Sit: 1: +2 Total assist;Patient percentage (comment);With upper extremity assist;With armrests;To chair/3-in-1;To bed Stand to Sit Details: Pt assist 70%.  +2 for safety with cues for hand placement.  Performed x 2 to bed and to chair due to "woozy" and unresponsive episode.   Stand Pivot Transfers: 1: +2 Total assist;Patient percentage (comment) Stand Pivot Transfer Details (indicate cue type and reason): Pt assist 60%.  +2 for safety with cues for sequencing with RW.      Exercise    End of Session PT - End of Session Activity Tolerance: Other (comment) (Pt limited by being "woozy"/unable to follow commands) Patient left: in chair;with call bell in reach Nurse Communication: Mobility status for transfers;Mobility status for ambulation (RN made aware of episode) General Behavior During Session: [facility] for tasks performed Cognition: [facility] for tasks  performed  Wood, Brian Loa 10/23/2011, 12:23 PM

## 2011-10-23 NOTE — Consult Note (Signed)
[facility] CONSULT NOTE     Reason for Consult: dizziness and right hand numbness   HPI:    [name] is an 66 y.o. male with obesity, HTN, DM.  Patient was initially admitted for CP but while he was in the ED he mentioned he also noted sudden onset right arm paresthesia. While in the hospital CM x 3 have been negative.  He states he was watching TV at 3:30 when he had a sudden onset of right arm tingling.  He states his right arm paresthesia is still present.  He denies any weakness or dysmetria.    CT head No evidence of acute intracranial abnormality.   Past Medical History  Diagnosis Date  . Obese   . Hypertension   . Diabetes mellitus   . Diabetic diarrhea   . Sleep apnea   . Diabetic neuropathy, painful   . Chronic indwelling foley catheter   . Bladder spasms   . Complication of anesthesia   . PONV (postoperative nausea and vomiting)     Past Surgical History  Procedure Date  . Joint replacement     Left Total Knee  . Gastroplasty   . Cholecystectomy     History reviewed. No pertinent family history.  Social History:  reports that he has quit smoking. He has never used smokeless tobacco. He reports that he does not drink alcohol or use illicit drugs.  Allergies  Allergen Reactions  . Cefadroxil Hives  . Cephalexin Hives  . Lipitor (Atorvastatin Calcium) Swelling  . Metformin And Related Swelling  . Morphine And Related Other (See Comments)    Sweating, feels like is "in rocky boat."  . Robaxin Other (See Comments)    Feels like is in "rocky boat"    Medications:    Scheduled:   . aspirin  300 mg Rectal Daily   Or  . aspirin  325 mg Oral Daily  . enoxaparin  40 mg Subcutaneous Q24H  . glimepiride  4 mg Oral QAC lunch  . insulin aspart  0-20 Units Subcutaneous TID WC  . insulin aspart  10 Units Subcutaneous TID WC  . insulin glargine  30 Units Subcutaneous Daily  . insulin glargine  30 Units Subcutaneous Once    . pantoprazole  40 mg Oral Q1200  . ramipril  10 mg Oral BID  . DISCONTD: insulin aspart  6 Units Subcutaneous TID WC  . DISCONTD: insulin glargine  50 Units Subcutaneous Daily   Blood pressure 141/69, pulse 71, temperature 97.6 F (36.4 C), temperature source Oral, resp. rate 15, height 6\' 1"  (1.854 m), weight 174.5 kg (384 lb 11.2 oz), SpO2 98.00%.   Neurologic Examination:   Mental Status: Alert, oriented, thought content appropriate.  Speech fluent without evidence of aphasia. Able to follow 3 step commands without difficulty. Cranial Nerves: II-Visual fields grossly intact. III/IV/VI-Extraocular movements intact.  Pupils reactive bilaterally. V/VII-Smile symmetric VIII-grossly intact IX/X-normal gag XI-bilateral shoulder shrug XII-midline tongue extension Motor: 5/5 bilaterally with normal tone and bulk Sensory: Pinprick and light touch intact throughout, bilaterally Deep Tendon Reflexes: 1+ upper extremities and symmetric depressed in bilateral patella and no achilles.  Plantars: equivical bilaterally Cerebellar: Normal finger-to-nose,   Lab Results  Component Value Date/Time   CHOL 168 10/19/2011  2:00 AM    Results for orders placed during the hospital encounter of 10/18/11 (from the past 48  hour(s))  GLUCOSE, CAPILLARY     Status: Abnormal   Collection Time   10/21/11 11:13 AM      Component Value Range Comment   Glucose-Capillary 218 (*) 70 - 99 (mg/dL)   MRSA PCR SCREENING     Status: Normal   Collection Time   10/21/11  1:34 PM      Component Value Range Comment   MRSA by PCR NEGATIVE  NEGATIVE    GLUCOSE, CAPILLARY     Status: Abnormal   Collection Time   10/21/11  4:43 PM      Component Value Range Comment   Glucose-Capillary 217 (*) 70 - 99 (mg/dL)   BASIC METABOLIC PANEL     Status: Abnormal   Collection Time   10/22/11  3:10 AM      Component Value Range Comment   Sodium 138  135 - 145 (mEq/L)    Potassium 4.0  3.5 - 5.1 (mEq/L)    Chloride 102  96 -  112 (mEq/L)    CO2 29  19 - 32 (mEq/L)    Glucose, Bld 166 (*) 70 - 99 (mg/dL)    BUN 10  6 - 23 (mg/dL)    Creatinine, Ser 0.72  0.50 - 1.35 (mg/dL)    Calcium 9.0  8.4 - 10.5 (mg/dL)    GFR calc non Af Amer >90  >90 (mL/min)    GFR calc Af Amer >90  >90 (mL/min)   GLUCOSE, CAPILLARY     Status: Abnormal   Collection Time   10/22/11  7:12 AM      Component Value Range Comment   Glucose-Capillary 178 (*) 70 - 99 (mg/dL)    Comment 1 Documented in Chart      Comment 2 Notify RN     GLUCOSE, CAPILLARY     Status: Abnormal   Collection Time   10/22/11  8:38 AM      Component Value Range Comment   Glucose-Capillary 211 (*) 70 - 99 (mg/dL)    Comment 1 Documented in Chart      Comment 2 Notify RN     GLUCOSE, CAPILLARY     Status: Abnormal   Collection Time   10/22/11 11:17 AM      Component Value Range Comment   Glucose-Capillary 251 (*) 70 - 99 (mg/dL)    Comment 1 Documented in Chart      Comment 2 Notify RN     GLUCOSE, CAPILLARY     Status: Abnormal   Collection Time   10/22/11  5:46 PM      Component Value Range Comment   Glucose-Capillary 269 (*) 70 - 99 (mg/dL)    Comment 1 Documented in Chart      Comment 2 Notify RN     GLUCOSE, CAPILLARY     Status: Abnormal   Collection Time   10/22/11 10:38 PM      Component Value Range Comment   Glucose-Capillary 194 (*) 70 - 99 (mg/dL)    Comment 1 Documented in Chart      Comment 2 Notify RN     GLUCOSE, CAPILLARY     Status: Abnormal   Collection Time   10/23/11  8:06 AM      Component Value Range Comment   Glucose-Capillary 231 (*) 70 - 99 (mg/dL)    Comment 1 Documented in Chart      Comment 2 Notify RN       Ct Head Wo Contrast  10/21/2011  *  RADIOLOGY REPORT*  Clinical Data: Arm numbness, evaluate for CVA  CT HEAD WITHOUT CONTRAST  Technique:  Contiguous axial images were obtained from the base of the skull through the vertex without contrast.  Comparison: 10/18/2011  Findings: No evidence of parenchymal hemorrhage or  extra-axial fluid collection. No mass lesion, mass effect, or midline shift.  No CT evidence of acute infarction.  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  Mild subcortical white matter and periventricular small vessel ischemic changes.  Mild global cortical atrophy.  No ventriculomegaly.  The visualized paranasal sinuses are essentially clear. The mastoid air cells are unopacified.  No evidence of calvarial fracture.  IMPRESSION: No evidence of acute intracranial abnormality.  Mild atrophy with small vessel ischemic changes.  Original Report Authenticated By: Julian Hy, M.D.     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  66 YO male with multiple Stroke Risk Factors - diabetes mellitus, hyperlipidemia, hypertension and morbid obesity.    Plan: 1. Strict blood glucose control, lower LDL to <100 2. Carotid dopplers 3. Prophylactic therapy-Antiplatelet med: Aspirin - dose 325 daily 4. Risk  Factor modification  5. No indications for rehabilitation intervention      Etta Quill PA-C [facility] [phone]  10/23/2011, 10:35 AM

## 2011-10-23 NOTE — Progress Notes (Signed)
IV Team was paged on day shift. IV nurse came and assessed pt to place 2nd IV and pt refused. Per IV nurse, two IV nurses already attempted to start second IV. Only one failed attempt was noted in chart. IV nurse stated that anesthesia will start the second IV pre-procedure at [facility]. Kathalene Frames

## 2011-10-24 ENCOUNTER — Encounter (HOSPITAL_COMMUNITY)
Admission: EM | Disposition: A | Discharge status: Home Health | Payer: Self-pay | Source: Home / Self Care | Attending: Internal Medicine

## 2011-10-24 ENCOUNTER — Encounter (HOSPITAL_COMMUNITY): Payer: Medicare Other

## 2011-10-24 ENCOUNTER — Ambulatory Visit (HOSPITAL_COMMUNITY): Payer: Medicare Other

## 2011-10-24 HISTORY — PX: LEFT HEART CATHETERIZATION WITH CORONARY ANGIOGRAM: SHX5451

## 2011-10-24 LAB — GLUCOSE, CAPILLARY
Glucose-Capillary: 247 mg/dL — ABNORMAL HIGH (ref 70–99)
Glucose-Capillary: 245 mg/dL — ABNORMAL HIGH (ref 70–99)

## 2011-10-24 LAB — CBC
HCT: 43.2 % (ref 39.0–52.0)
Hemoglobin: 14.9 g/dL (ref 13.0–17.0)
MCV: 88 fL (ref 78.0–100.0)
WBC: 8.1 10*3/uL (ref 4.0–10.5)

## 2011-10-24 LAB — CREATININE, SERUM
GFR calc Af Amer: >90 mL/min (ref >90)
GFR calc non Af Amer: >90 mL/min (ref >90)

## 2011-10-24 SURGERY — LEFT HEART CATHETERIZATION WITH CORONARY ANGIOGRAM
Anesthesia: LOCAL

## 2011-10-24 MED ORDER — LIDOCAINE HCL (PF) 1 % IJ SOLN
INTRAMUSCULAR | Status: AC
  Filled 2011-10-24: qty 30

## 2011-10-24 MED ORDER — VERAPAMIL HCL 2.5 MG/ML IV SOLN
INTRAVENOUS | Status: AC
  Filled 2011-10-24: qty 2

## 2011-10-24 MED ORDER — MIDAZOLAM HCL 2 MG/2ML IJ SOLN
INTRAMUSCULAR | Status: AC
  Filled 2011-10-24: qty 2

## 2011-10-24 MED ORDER — NITROGLYCERIN 0.2 MG/ML ON CALL CATH LAB
INTRAVENOUS | Status: AC
  Filled 2011-10-24: qty 1

## 2011-10-24 MED ORDER — ENOXAPARIN SODIUM 40 MG/0.4ML ~~LOC~~ SOLN
40.0000 mg | SUBCUTANEOUS | Status: DC
  Administered 2011-10-25 – 2011-10-29 (×5): 40 mg via SUBCUTANEOUS
  Filled 2011-10-24 (×5): qty 0.4

## 2011-10-24 MED ORDER — FENTANYL CITRATE 0.05 MG/ML IJ SOLN
INTRAMUSCULAR | Status: AC
  Filled 2011-10-24: qty 2

## 2011-10-24 MED ORDER — HEPARIN SODIUM (PORCINE) 1000 UNIT/ML IJ SOLN
INTRAMUSCULAR | Status: AC
  Filled 2011-10-24: qty 1

## 2011-10-24 MED ORDER — ACETAMINOPHEN 325 MG PO TABS
650.0000 mg | ORAL_TABLET | ORAL | Status: DC | PRN
  Administered 2011-10-24 – 2011-10-28 (×9): 650 mg via ORAL
  Filled 2011-10-24 (×9): qty 2

## 2011-10-24 MED ORDER — HEPARIN (PORCINE) IN NACL 2-0.9 UNIT/ML-% IJ SOLN
INTRAMUSCULAR | Status: AC
  Filled 2011-10-24: qty 2000

## 2011-10-24 MED ORDER — ISOSORBIDE MONONITRATE ER 30 MG PO TB24
30.0000 mg | ORAL_TABLET | Freq: Every day | ORAL | Status: DC
  Administered 2011-10-24 – 2011-10-29 (×6): 30 mg via ORAL
  Filled 2011-10-24 (×6): qty 1

## 2011-10-24 MED ORDER — ONDANSETRON HCL 4 MG/2ML IJ SOLN: 4.0000 mg | Freq: Four times a day (QID) | INTRAMUSCULAR | Status: DC | PRN

## 2011-10-24 MED ORDER — CLOPIDOGREL BISULFATE 75 MG PO TABS
75.0000 mg | ORAL_TABLET | Freq: Every day | ORAL | Status: DC
  Administered 2011-10-24 – 2011-10-29 (×6): 75 mg via ORAL
  Filled 2011-10-24 (×6): qty 1

## 2011-10-24 NOTE — Progress Notes (Signed)
Subjective: No new complaints  Objective: Vital signs in last 24 hours: Temp:  [97.2 F (36.2 C)-97.9 F (36.6 C)] 97.4 F (36.3 C) (02/13 0530) Pulse Rate:  [69-102] 69  (02/13 0530) Resp:  [14-24] 18  (02/13 0530) BP: (123-145)/(64-84) 145/84 mmHg (02/13 0530) SpO2:  [97 %-99 %] 97 % (02/13 0530) Weight change:  Last BM Date: 10/23/11  Intake/Output from previous day: 02/12 0701 - 02/13 0700 In: 723 [P.O.:720; I.V.:3] Out: 3101 [Urine:3100; Stool:1] Intake/Output this shift: Total I/O In: 3 [I.V.:3] Out: 1800 [Urine:1800]  General appearance: alert and cooperative Resp: clear to auscultation bilaterally Cardio: regular rate and rhythm, S1, S2 normal, no murmur, click, rub or gallop Extremities: extremities normal, atraumatic, no cyanosis or edema  Lab Results:  [facility] 10/23/11 1912  WBC 7.8  HGB 15.0  HCT 44.3  PLT 250   BMET  Basename 10/23/11 1912 10/22/11 0310  NA 137 138  K 4.0 4.0  CL 100 102  CO2 28 29  GLUCOSE 129* 166*  BUN 13 10  CREATININE 0.79 0.72  CALCIUM 9.3 9.0    Studies/Results: No results found.  Medications: I have reviewed the patient's current medications.  Assessment/Plan: Principal Problem:  *Chest pain  Cardiac cath today Active Problems:  R arm/face numbness, appreciate neuro input, likely subcortical CVA, medical RX  DM (diabetes mellitus) with complications fair control  Hypertension controlled   LOS: 6 days   Bitania Shankland JOSEPH 10/24/2011, 6:33 AM

## 2011-10-24 NOTE — Progress Notes (Signed)
0530 Pt had 6 beat run of PVCs. Pt asymptomatic and asleep. Paged cardiology MD in regards to situation. Still awaiting a call from MD. Will continue to monitor pt. Kathalene Frames

## 2011-10-24 NOTE — Progress Notes (Signed)
Pt had run of 6 PVC. MD notified there are no new orders at this time will continue to monitor patient. Roslyn Smiling, RN

## 2011-10-24 NOTE — Progress Notes (Addendum)
[facility] PROGRESS NOTE    SUBJECTIVE   No new complaints.  Right hand and arm tingling improved  OBJECTIVE   Vital signs in last 24 hours: Temp:  [97.2 F (36.2 C)-97.9 F (36.6 C)] 97.4 F (36.3 C) (02/13 0530) Pulse Rate:  [69-102] 69  (02/13 0530) Resp:  [14-24] 18  (02/13 0530) BP: (123-145)/(64-84) 145/84 mmHg (02/13 0530) SpO2:  [97 %-99 %] 97 % (02/13 0530) Weight:  [173.365 kg (382 lb 3.2 oz)] 173.365 kg (382 lb 3.2 oz) (02/13 0759)  Intake/Output from previous day: 02/12 0701 - 02/13 0700 In: 723 [P.O.:720; I.V.:3] Out: 3101 [Urine:3100; Stool:1] Intake/Output this shift:   Nutritional status: NPO  Past Medical History  Diagnosis Date  . Obese   . Hypertension   . Diabetes mellitus   . Diabetic diarrhea   . Sleep apnea   . Diabetic neuropathy, painful   . Chronic indwelling foley catheter   . Bladder spasms   . Complication of anesthesia   . PONV (postoperative nausea and vomiting)     Neurologic Exam:  Mental Status:  Alert, oriented, thought content appropriate. Speech fluent without evidence of aphasia. Able to follow 3 step commands without difficulty.  Cranial Nerves:  II-Visual fields grossly intact.  III/IV/VI-Extraocular movements intact. Pupils reactive bilaterally.  V/VII-Smile symmetric  VIII-grossly intact  IX/X-normal gag  XI-bilateral shoulder shrug  XII-midline tongue extension  Motor: 5/5 bilaterally with normal tone and bulk  Sensory: Pinprick and light touch intact throughout, bilaterally  Deep Tendon Reflexes: 1+ upper extremities and symmetric depressed in bilateral patella and no achilles.  Plantars: equivical bilaterally  Cerebellar: Normal finger-to-nose (slow on the right), clumsy right FFM    Lab Results: Lab Results  Component Value Date/Time   CHOL 168 10/19/2011  2:00 AM   Lipid Panel No results found for this basename: CHOL,TRIG,HDL,CHOLHDL,VLDL,LDLCALC in the  last 72 hours  Studies/Results: No results found.  Medications:     Scheduled:   . aspirin  324 mg Oral Pre-Cath  . aspirin  300 mg Rectal Daily   Or  . aspirin  325 mg Oral Daily  . diazepam  5 mg Oral On Call  . enoxaparin  40 mg Subcutaneous Q24H  . glimepiride  4 mg Oral QAC lunch  . insulin aspart  0-20 Units Subcutaneous TID WC  . insulin aspart  10 Units Subcutaneous TID WC  . insulin glargine  30 Units Subcutaneous Daily  . insulin glargine  30 Units Subcutaneous Once  . pantoprazole  40 mg Oral Q1200  . ramipril  10 mg Oral BID  . sodium chloride  3 mL Intravenous Q12H  . 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  66 YO male with multiple Stroke Risk Factors - diabetes mellitus, hyperlipidemia, hypertension and morbid obesity.  Plan:  1. Strict blood glucose control, lower LDL to <100  2. Carotid dopplers --No evidence of stenosis involving the right internal carotid artery and the left internal carotid artery. 3. Prophylactic therapy-Antiplatelet med: Aspirin - dose 325 daily  4. Risk Factor modification  5. No indications for rehabilitation intervention 6.Recommend out patient MRI after discharge 7.Recommend follow up out patient neurology 8. Neurology will  S/O   Etta Quill PA-C [facility] [phone]  10/24/2011, 8:29 AM

## 2011-10-24 NOTE — Interval H&P Note (Signed)
History and Physical Interval Note:  10/24/2011 1:07 PM  [name]  has presented today for surgery, with the diagnosis of chest pain/abnormal myocardial perfusion study  The various methods of treatment have been discussed with the patient and family. After consideration of risks, benefits and other options for treatment, the patient has consented to  Procedure(s) (LRB): LEFT HEART CATHETERIZATION WITH CORONARY ANGIOGRAM (N/A) as a surgical intervention .  The patients' history has been reviewed, patient examined, no change in status, stable for surgery.  I have reviewed the patients' chart and labs.  Questions were answered to the patient's satisfaction.     Danna Casella

## 2011-10-24 NOTE — Op Note (Signed)
PROCEDURE:  Left heart catheterization with selective coronary angiography via the radial artery approach.  INDICATIONS:  66 year old male with morbid obesity, mild strokelike symptoms with right arm paresthesias, chest pressure, normal cardiac markers, EF 50-55% on echocardiogram, here for catheterization.  The risks, benefits, and details of the procedure were explained to the patient, including possibilities of stroke, heart attack, death, renal impairment, arterial damage, bleeding.  The patient verbalized understanding and wanted to proceed.  Informed written consent was obtained. Neurology was consult it because of right arm paresthesias and determined that he had a small stroke. Dr. Radford Pax personally spoke with neurology who stated that it was fine for him to proceed with cardiac catheterization. He does understand and that one of the risks of cardiac catheterization is worsening strokelike symptoms.  PROCEDURE TECHNIQUE:  Allen's test was performed pre-and post procedure and was normal. The right radial artery site was prepped and draped in a sterile fashion. One percent lidocaine was used for local anesthesia. Using the modified Seldinger technique a 5 French hydrophilic sheath was inserted into the radial artery without difficulty. 3 mg of verapamil was administered via the sheath. A Judkins right #4 catheter with the guidance of a Versicore wire was placed in the right coronary cusp and selectively cannulated the right coronary artery. After traversing the aortic arch, 5000 units units of heparin IV was administered. Later on in the case, another 3000 units of heparin IV was administered. 200 mcg of nitroglycerin was also administered. After attempts with a Judkins 3.5, Judkins left 4, multipurpose 2, a Medtronic ERAD Left was finally successful and used to selectively cannulate the left main artery. Multiple views with hand injection of Omnipaque were obtained. Catheter a pigtail catheter was used  to cross into the left ventricle, hemodynamics were obtained. A left ventriculogram was not performed because of dye usage during the case and a left ventricular end-diastolic pressure of 23 mm mercury. Echocardiogram was ordered and done. Following the procedure, sheath was removed, patient was hemodynamically stable, hemostasis was maintained with a Terumo T band.   CONTRAST:  Total of 130 ml.    FLOUROSCOPY TIME: 19.1 min. Complicated attempts at obtaining left coronary artery.  COMPLICATIONS:  None.    HEMODYNAMICS:  Aortic pressure was Q000111Q mmHg; LV systolic pressure was 0000000 mmHg; LVEDP 26 mmHg.  There was no gradient between the left ventricle and aorta.    ANGIOGRAPHIC DATA:    Left main: Widely patent giving rise to the LAD and circumflex artery.  Left anterior descending (LAD): There is 1 significant proximal diagonal branch. The proximal portion of this diagonal branch has approximately 50% stenosis. The remainder of the left anterior descending artery is tortuous.  Circumflex artery (CIRC): The proximal circumflex artery has approximately 30% stenosis and gives rise to 2 obtuse marginal branches. There are minor luminal irregularities throughout the remainder of this vessel. Collateralization is noted from the circumflex region.  Right coronary artery (RCA): This artery is dominant giving rise to the posterior descending artery. There is significant diffuse distal disease arising proximal to the PDA ostium but most significant just before the bifurcation of a small posterior lateral branch distally. The entire segment is diffusely diseased and encompasses at least 35-40 mm in length. He also demonstrates left to right collateralization to this distal region.  LEFT VENTRICULOGRAM: Not performed. Pigtail was used to cross into the left ventricle for pressures and pullback.  IMPRESSIONS:  1. Significant distal right coronary artery disease, diffuse, long segment up to  90% at the  bifurcation of a small PDA branch. 50% stenosis of the proximal large first diagonal branch.  2. Elevated left ventricular end-diastolic pressure consistent with diastolic dysfunction. 26 mmHg.   RECOMMENDATION:    I have discussed the case with Dr. Radford Pax his cardiologist as well as Dr. Daneen Schick of interventional cardiology. Given the diffuse, long nature of his distal right coronary region, percutaneous intervention to this region portends a high risk to benefit ratio. Medical management at this point.  Interestingly, while he was about to go to the holding area, he had a spell similar to what has been described previously. His right arm fell to his side and when asked to raise it up he was unresponsive to that command, appeared transiently unconscious. After about 15-30 seconds he was able to describe what he felt. He could see our lips moving but could not hear speech. This was transient and afterwards he was back to normal. He describes that this is exactly what he has been feeling during this hospitalization. Unfortunately, he was not on telemetry when this occurred. Telemetry was then reinitiated and demonstrated sinus rhythm. Occasional PVCs were noted.  We will monitor him very closely. Neurology has been following.

## 2011-10-24 NOTE — Progress Notes (Signed)
[facility] PROGRESS NOTE    SUBJECTIVE   Called back to see patient after cardiac catheter.  Patient was complaining of right lateral forearm "pain" that extended from elbow to little finger.  Patient had a radial stick for his catheterization.  To do so, he had a air tourniquet placed over his right wrist.  After nurse released some of the air his pain immediately stopped.  No other symptoms of weakness, blurred or decreased vision, dysarthria or aphasia.  He is now back to baseline .   OBJECTIVE   Vital signs in last 24 hours: Temp:  [97.2 F (36.2 C)-97.9 F (36.6 C)] 97.6 F (36.4 C) (02/13 1149) Pulse Rate:  [64-83] 66  (02/13 1235) Resp:  [16-18] 18  (02/13 1149) BP: (123-151)/(64-89) 151/89 mmHg (02/13 1149) SpO2:  [96 %-99 %] 96 % (02/13 1149) Weight:  [173.365 kg (382 lb 3.2 oz)] 173.365 kg (382 lb 3.2 oz) (02/13 0759)  Intake/Output from previous day: 02/12 0701 - 02/13 0700 In: 723 [P.O.:720; I.V.:3] Out: 3101 [Urine:3100; Stool:1] Intake/Output this shift: Total I/O In: -  Out: 1300 [Urine:1300] Nutritional status: NPO  Past Medical History  Diagnosis Date  . Obese   . Hypertension   . Diabetes mellitus   . Diabetic diarrhea   . Sleep apnea   . Diabetic neuropathy, painful   . Chronic indwelling foley catheter   . Bladder spasms   . Complication of anesthesia   . PONV (postoperative nausea and vomiting)     Neurologic Exam:  No change since previous exam. Mental Status:  Alert, oriented, thought content appropriate. Speech fluent without evidence of aphasia. Able to follow 3 step commands without difficulty.  Cranial Nerves:  II-Visual fields grossly intact.  III/IV/VI-Extraocular movements intact. Pupils reactive bilaterally.  V/VII-Smile symmetric  VIII-grossly intact  IX/X-normal gag  XI-bilateral shoulder shrug  XII-midline tongue extension  Motor: 5/5 bilaterally with normal tone and bulk    Sensory: Pinprick and light touch intact throughout, bilaterally  Deep Tendon Reflexes: 1+ upper extremities and symmetric depressed in bilateral patella and no achilles.  Plantars: equivical bilaterally  Cerebellar: Normal finger-to-nose (slower on the right), clumsy right FFM compared to the left  Lab Results: Lab Results  Component Value Date/Time   CHOL 168 10/19/2011  2:00 AM   Lipid Panel No results found for this basename: CHOL,TRIG,HDL,CHOLHDL,VLDL,LDLCALC in the last 72 hours  Studies/Results: No results found.  Medications:     Scheduled:   . aspirin  324 mg Oral Pre-Cath  . aspirin  300 mg Rectal Daily   Or  . aspirin  325 mg Oral Daily  . diazepam  5 mg Oral On Call  . enoxaparin  40 mg Subcutaneous Q24H  . fentaNYL      . glimepiride  4 mg Oral QAC lunch  . heparin      . heparin      . insulin aspart  0-20 Units Subcutaneous TID WC  . insulin aspart  10 Units Subcutaneous TID WC  . insulin glargine  30 Units Subcutaneous Daily  . lidocaine      . midazolam      . midazolam      . nitroGLYCERIN      . pantoprazole  40 mg Oral Q1200  .  ramipril  10 mg Oral BID  . sodium chloride  3 mL Intravenous Q12H  . verapamil      . DISCONTD: diazepam  5 mg Oral On Call    Assessment/Plan:    No change in plan as previously noted in note this morning. If patient continues to have fluctuating right arm symptoms would highly consider a CTA head and neck once renal function is stable As patient just returned from Cardiac catheterization.  Will have stroke md follw  I have discussed patient with Dr. Paulino Rily, he agrees with the above mentioned.   Etta Quill PA-C [facility] [phone]  10/24/2011, 3:02 PM

## 2011-10-25 LAB — BASIC METABOLIC PANEL
BUN: 14 mg/dL (ref 6–23)
CO2: 26 mEq/L (ref 19–32)
Calcium: 9 mg/dL (ref 8.4–10.5)
Chloride: 101 mEq/L (ref 96–112)
Creatinine, Ser: 0.63 mg/dL (ref 0.50–1.35)
GFR calc Af Amer: >90 mL/min (ref >90)
GFR calc non Af Amer: >90 mL/min (ref >90)
Glucose, Bld: 217 mg/dL — ABNORMAL HIGH (ref 70–99)
Potassium: 5 mEq/L (ref 3.5–5.1)
Sodium: 134 mEq/L — ABNORMAL LOW (ref 135–145)

## 2011-10-25 LAB — GLUCOSE, CAPILLARY
Glucose-Capillary: 211 mg/dL — ABNORMAL HIGH (ref 70–99)
Glucose-Capillary: 154 mg/dL — ABNORMAL HIGH (ref 70–99)
Glucose-Capillary: 158 mg/dL — ABNORMAL HIGH (ref 70–99)

## 2011-10-25 MED ORDER — INSULIN GLARGINE 100 UNIT/ML ~~LOC~~ SOLN
60.0000 [IU] | Freq: Every day | SUBCUTANEOUS | Status: DC
Start: 2011-10-25 — End: 2011-10-29
  Administered 2011-10-25 – 2011-10-29 (×5): 60 [IU] via SUBCUTANEOUS
  Filled 2011-10-25: qty 3

## 2011-10-25 MED ORDER — LORAZEPAM 1 MG PO TABS
1.0000 mg | ORAL_TABLET | Freq: Four times a day (QID) | ORAL | Status: DC | PRN
  Administered 2011-10-25 – 2011-10-27 (×2): 1 mg via ORAL
  Filled 2011-10-25 (×2): qty 1

## 2011-10-25 MED ORDER — METOPROLOL TARTRATE 12.5 MG HALF TABLET
12.5000 mg | ORAL_TABLET | Freq: Two times a day (BID) | ORAL | Status: DC
  Administered 2011-10-25 – 2011-10-29 (×9): 12.5 mg via ORAL
  Filled 2011-10-25 (×10): qty 1

## 2011-10-25 NOTE — Progress Notes (Signed)
Subjective: No chest pain.  Had similar brief LOC spell yesterday, unfortunately not on monitor.  18 bear run V tach this AM without symptoms.    Objective: Vital signs in last 24 hours: Temp:  [97.5 F (36.4 C)-98.1 F (36.7 C)] 97.5 F (36.4 C) (02/14 0500) Pulse Rate:  [64-89] 73  (02/14 0500) Resp:  [18-20] 20  (02/14 0500) BP: (109-151)/(67-89) 115/77 mmHg (02/14 0500) SpO2:  [92 %-96 %] 96 % (02/14 0500) Weight:  [173.365 kg (382 lb 3.2 oz)] 173.365 kg (382 lb 3.2 oz) (02/13 0759) Weight change:  Last BM Date: 10/24/11  Intake/Output from previous day: 02/13 0701 - 02/14 0700 In: -  Out: 2400 [Urine:2400] Intake/Output this shift:    General appearance: alert and cooperative Resp: clear to auscultation bilaterally Cardio: regular rate and rhythm, S1, S2 normal, no murmur, click, rub or gallop GI: soft, non-tender; bowel sounds normal; no masses,  no organomegaly  Lab Results:  [facility] 10/24/11 2217 10/23/11 1912  WBC 8.1 7.8  HGB 14.9 15.0  HCT 43.2 44.3  PLT 223 250   BMET  Basename 10/24/11 2217 10/23/11 1912  NA -- 137  K -- 4.0  CL -- 100  CO2 -- 28  GLUCOSE -- 129*  BUN -- 13  CREATININE 0.73 0.79  CALCIUM -- 9.3    Studies/Results: No results found.  Medications: I have reviewed the patient's current medications.  Assessment/Plan: Principal Problem:  *Chest pain  Cardiac cath noted.  Medical therapy, now on plavix/ASA and nitrate Active Problems:  V Tach.  18 beats this am.  Another brief spell LOC yesterday, unfortunately not on monitor.  Continue      Telemetry, Cardiology, any other rx?  DM (diabetes mellitus) with complications  Increase lantus to 60 units now that he is back on diet  L subcortical CVA  Medical therapy, on plavix/ASA  Hypertension ok  Disposition  Would like PT to see again today, ?need NHP     LOS: 7 days   Judah Chevere JOSEPH 10/25/2011, 7:04 AM

## 2011-10-25 NOTE — Progress Notes (Signed)
Patient had 18 beats of v-tach this morning. He was asymptomatic and returned to sinus rhythm in the mid-70's. Vital signs stable. On-call MD paged and notified. Will continue to monitor.  Estelle Grumbles, RN 10/25/11

## 2011-10-25 NOTE — Progress Notes (Addendum)
Physical Therapy Treatment Patient Details Name: MAXIMILLIANO WHEELER MRN: [id number] DOB: 16-Feb-1946 Today's Date: 10/25/2011  PT Assessment/Plan  PT - Assessment/Plan Comments on Treatment Session: Pt admitted with chest pain. Pt states that he did not get out of bed much at home.Pt was very unwilling to walk in hallway or put on gripper socks today. Pt did walk in room with RW for a total of 50 feet without LOB or SOB with supervision. His O2 stayed in the high 90's during ambulation. Overall pt is moving well using RW.  PT Plan: Discharge plan remains appropriate;Frequency remains appropriate PT Frequency: Min 3X/week Follow Up Recommendations: Skilled nursing facility (or HHPT with some help from family/friends) Equipment Recommended: Defer to next venue PT Goals  Acute Rehab PT Goals PT Goal: Supine/Side to Sit - Progress: Progressing toward goal PT Goal: Sit to Supine/Side - Progress: Progressing toward goal PT Goal: Sit to Stand - Progress: Progressing toward goal PT Goal: Stand to Sit - Progress: Progressing toward goal PT Goal: Ambulate - Progress: Progressing toward goal  PT Treatment Precautions/Restrictions  Precautions Precautions: Fall Required Braces or Orthoses: No Restrictions Weight Bearing Restrictions: No Mobility (including Balance) Bed Mobility Bed Mobility: Yes Supine to Sit: 5: Supervision Supine to Sit Details (indicate cue type and reason): supervision for safety  Sit to Supine: 5: Supervision Sit to Supine - Details (indicate cue type and reason): cueing for progression of exercise  Transfers Transfers: Yes Sit to Stand: 5: Supervision;From bed Sit to Stand Details (indicate cue type and reason): pt needing cueing on progression, does a rocking motion to stand but is able to do so without any assist from PT; x 2 Stand to Sit: 5: Supervision;To bed Stand to Sit Details: cueing to be all the way against the bed before sitting; x 2   Ambulation/Gait Ambulation/Gait: Yes Ambulation/Gait Assistance: 5: Supervision Ambulation/Gait Assistance Details (indicate cue type and reason): cueing to just push the RW instead of pick it up and move it; pt able to walk around room for 25 feet, take a break and then walk another 25 feet in room Ambulation Distance (Feet): 50 Feet Assistive device: Rolling walker Gait Pattern: Within Functional Limits Stairs: No Wheelchair Mobility Wheelchair Mobility: No  Balance Balance Assessed: Yes Dynamic Standing Balance Dynamic Standing - Balance Support: Bilateral upper extremity supported;During functional activity Dynamic Standing - Level of Assistance: 5: Stand by assistance Dynamic Standing - Balance Activities: Lateral lean/weight shifting;Forward lean/weight shifting;Other (comment) (lifting feet off floor) End of Session PT - End of Session Activity Tolerance: Patient tolerated treatment well Patient left: in bed;with call bell in reach General Behavior During Session: [facility] for tasks performed Cognition: [facility] for tasks performed  Dulce Sellar 10/25/2011, 3:29 PM  10/25/2011  Donnella Sham, [place] [phone] (pager)

## 2011-10-25 NOTE — Progress Notes (Signed)
SUBJECTIVE:  Results of cath noted.  Significant distal RCA disease in a long diffuse segment up to 90% at bifurcation of small PDA and 50% prox large diagonal - high risk for PCI.  Had another brief episode of LOC but not on monitor.  Had 18 beat run of WCT this am  OBJECTIVE:   Vitals:   Filed Vitals:   10/24/11 1235 10/24/11 1728 10/24/11 2100 10/25/11 0500  BP:  144/78 109/67 115/77  Pulse: 66 77 89 73  Temp:   98.1 F (36.7 C) 97.5 F (36.4 C)  TempSrc:  Other (Comment) Oral Oral  Resp:  18 20 20   Height:      Weight:      SpO2:   92% 96%   I&O's:   Intake/Output Summary (Last 24 hours) at 10/25/11 0826 Last data filed at 10/25/11 0500  Gross per 24 hour  Intake      0 ml  Output   2400 ml  Net  -2400 ml   TELEMETRY: Reviewed telemetry pt in NSR:     PHYSICAL EXAM General: Well developed, well nourished, in no acute distress Head: Eyes PERRLA, No xanthomas.   Normal cephalic and atramatic  Lungs:  Clear bilaterally to auscultation and percussion. Heart:   HRRR S1 S2 Pulses are 2+ & equal. Abdomen: Bowel sounds are positive, abdomen soft and non-tender without masses Extremities:  No clubbing, cyanosis or edema.  DP +1 Neuro: Alert and oriented X 3. Psych:  Good affect, responds appropriately   LABS: Basic Metabolic Panel:  Basename 10/25/11 0500 10/24/11 2217 10/23/11 1912  NA 134* -- 137  K 5.0 -- 4.0  CL 101 -- 100  CO2 26 -- 28  GLUCOSE 217* -- 129*  BUN 14 -- 13  CREATININE 0.63 0.73 --  CALCIUM 9.0 -- 9.3  MG -- -- --  PHOS -- -- --   Liver Function Tests: No results found for this basename: AST:2,ALT:2,ALKPHOS:2,BILITOT:2,PROT:2,ALBUMIN:2 in the last 72 hours No results found for this basename: LIPASE:2,AMYLASE:2 in the last 72 hours CBC:  Basename 10/24/11 2217 10/23/11 1912  WBC 8.1 7.8  NEUTROABS -- --  HGB 14.9 15.0  HCT 43.2 44.3  MCV 88.0 88.1  PLT 223 250    Lab Results  Component Value Date   INR 0.96 10/23/2011   INR 0.99  10/18/2011   INR 1.03 10/10/2011    RADIOLOGY: Dg Chest 2 View  10/18/2011  *RADIOLOGY REPORT*  Clinical Data: Chest tightness  CHEST - 2 VIEW  Comparison: 10/10/2011  Findings: Mild cardiomegaly.  Vascular clips in the left upper abdomen.  Relatively low lung volumes.  Some improvement in interstitial edema since previous exam.  No effusion.  Mild compression deformities in the lower thoracic spine   stable.  IMPRESSION:  1.  Cardiomegaly with   improvement in interstitial edema since previous exam.  Original Report Authenticated By: Trecia Rogers, M.D.   Ct Head Wo Contrast  10/21/2011  *RADIOLOGY REPORT*  Clinical Data: Arm numbness, evaluate for CVA  CT HEAD WITHOUT CONTRAST  Technique:  Contiguous axial images were obtained from the base of the skull through the vertex without contrast.  Comparison: 10/18/2011  Findings: No evidence of parenchymal hemorrhage or extra-axial fluid collection. No mass lesion, mass effect, or midline shift.  No CT evidence of acute infarction.  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  Mild subcortical white matter and periventricular small vessel ischemic changes.  Mild global cortical atrophy.  No ventriculomegaly.  The  visualized paranasal sinuses are essentially clear. The mastoid air cells are unopacified.  No evidence of calvarial fracture.  IMPRESSION: No evidence of acute intracranial abnormality.  Mild atrophy with small vessel ischemic changes.  Original Report Authenticated By: Julian Hy, M.D.   Ct Head Wo Contrast  10/18/2011  *RADIOLOGY REPORT*  Clinical Data: Dizziness, confusion, hypertension  CT HEAD WITHOUT CONTRAST  Technique:  Contiguous axial images were obtained from the base of the skull through the vertex without contrast.  Comparison: 10/10/2011 and earlier studies  Findings: Stable bilateral basal ganglia and thalamic mineralization.  Mild atrophy.  Small left caudate lacunar infarct, stable. There is no evidence of acute intracranial  hemorrhage, brain edema, mass lesion, acute infarction,   mass effect, or midline shift. Acute infarct may be inapparent on noncontrast CT. No other intra-axial abnormalities are seen, and the ventricles and sulci are within normal limits in size and symmetry.   No abnormal extra-axial fluid collections or masses are identified.  No significant calvarial abnormality.  IMPRESSION: 1. Negative for bleed or other acute intracranial process.  Original Report Authenticated By: Trecia Rogers, M.D.   Ct Head Wo Contrast  10/10/2011  *RADIOLOGY REPORT*  Clinical Data: Headache and altered mental status.  CT HEAD WITHOUT CONTRAST  Technique:  Contiguous axial images were obtained from the base of the skull through the vertex without contrast.  Comparison: 08/25/2010  Findings: Diffuse cerebral atrophy.  Mild ventricular dilatation consistent with central atrophy.  Low attenuation changes in the deep white matter consistent with small vessel ischemic change. Focal areas of hyperintensity in the basal ganglia and thalami bilaterally are stable since the prior study and likely represent calcification.  No mass effect or midline shift.  No abnormal extra- axial fluid collections.  Gray-white matter junctions are distinct. Basal cisterns are not effaced.  No evidence of acute intracranial hemorrhage.  Opacification of some of the ethmoid air cells. Paranasal sinuses are otherwise not opacified.  Mastoid air cells are not opacified.  No depressed skull fractures.  Stable appearance since previous study.  IMPRESSION: Chronic atrophy and small vessel ischemic changes.  A mild opacification of some ethmoid air cells.  No evidence of acute intracranial hemorrhage, mass lesion, or acute infarct.  Original Report Authenticated By: Neale Burly, M.D.   Nm Myocar Multi W/spect W/wall Motion / Ef  10/21/2011  *RADIOLOGY REPORT*  Clinical Data:  Chest pain, hypertension and shortness of breath.  MYOCARDIAL IMAGING WITH  SPECT (REST AND PHARMACOLOGIC-STRESS - 2 DAY PROTOCOL) GATED LEFT VENTRICULAR WALL MOTION STUDY LEFT VENTRICULAR EJECTION FRACTION  Technique:  Standard myocardial SPECT imaging was performed after intravenous injection of 32 mCi Tc-32m tetrofosmin at rest.  On a different day, intravenous infusion of  Lexiscan was performed under supervision of the Cardiology staff.  At peak effect of the drug, 33 mCi Tc-13m tetrofosmin was injected intravenously and standard myocardial SPECT imaging was performed.  Quantitative gated imaging was also performed to evaluate left ventricular wall motion and estimate left ventricular ejection fraction.  Comparison:  None.  Findings: Utilizing gated data, the end-diastolic volume is estimated at 208 ml and the end-systolic volume 123XX123 ml.  Calculated ejection fraction is 44%.  Wall motion analysis demonstrates no focal wall motion abnormality.  SPECT images show inferior wall attenuation on both stress and rest acquisitions.  When reviewing the raw planar imaging, this is felt to be most likely on the basis of diaphragmatic attenuation and convergence of the intents hepatic activity onto the  inferior wall. A component of inferior wall scar cannot be excluded.  A small focal area of the potential ischemia is present by SPECT images in the distal anteroseptal wall.  No other evidence to suggest inducible ischemia.  IMPRESSION:  1.  Mildly depressed quantitative left ventricular ejection fraction of 44% without significant focal wall motion abnormalities. 2.  Possible small focal area of ischemia in the distal anteroseptal wall. 3.  Inferior wall attenuation which is likely attributable to diaphragmatic attenuation and visible intense activity in the liver on both rest and stress acquisitions.  A component of inferior wall scar cannot be excluded.  Original Report Authenticated By: Azzie Roup, M.D.   Dg Chest Port 1 View  10/10/2011  *RADIOLOGY REPORT*  Clinical Data: Chest pain  and shortness of breath  PORTABLE CHEST - 1 VIEW  Comparison: 08/25/2010  Findings: Shallow inspiration with elevation of left hemidiaphragm. Heart size and pulmonary vascularity appear prominent.  Hazy perihilar interstitial opacities bilaterally suggesting mild edema or interstitial fibrosis.  Scattered calcified granulomas in the lungs.  Calcification of the aorta.  No blunting of costophrenic angles.  No pneumothorax.  IMPRESSION: Mild cardiac enlargement with increased pulmonary vascularity and hazy opacities suggesting mild edema.  Original Report Authenticated By: Neale Burly, M.D.      ASSESSMENT:  1.  Chest pain with long distal RCA disease involving PDA.  Felt high risk for PCI - on medical management 2.  Ventricular tachycardia with several episodes of LOC during hospital stay unfortunately not on monitor at the time 3.  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  DM 5.  HTN  PLAN:   1.  Continue ASA/nitrates/statin/Plavix/ACE I 2.  EP consult 3.  Add low dose lopressor as BP tolerates  Sueanne Margarita, MD  10/25/2011  8:26 AM

## 2011-10-25 NOTE — Progress Notes (Signed)
Patient is refusing telemetry monitoring and has taken off his leads. Patient was informed of the importance of monitoring his heart rhythm due to his multiple syncopal episodes and 18 beat run of Vtach this morning. On-call MD notified of situation.  Estelle Grumbles, RN 10/25/11

## 2011-10-25 NOTE — Progress Notes (Signed)
Stroke Team Progress Note  HISTORY [name] is an 66 y.o. male with obesity, HTN, DM. Patient was initially admitted for CP but while he was in the ED he mentioned he also noted sudden onset right arm paresthesia. While in the hospital CM x 3 have been negative. He states he was watching TV at 3:30 when he had a sudden onset of right arm tingling. He states his right arm paresthesia is still present. He denies any weakness or dysmetria. CT head No evidence of acute intracranial abnormality. His symptoms were too mild to treat with IV tPA. Stroke workup was instituted.  SUBJECTIVE No family is at the bedside. Overall he feels his condition is stable. No new complaints.patient informs me that in fact write a last week he developed sudden onset of nausea right upper extremity numbness and some weakness which has persisted. His nausea is improved but right hand weakness and numbness is still the same. The symptoms preceded his radial artery catheterization  OBJECTIVE Filed Vitals:   10/24/11 1235 10/24/11 1728 10/24/11 2100 10/25/11 0500  BP:  144/78 109/67 115/77  Pulse: 66 77 89 73  Temp:   98.1 F (36.7 C) 97.5 F (36.4 C)  TempSrc:  Other (Comment) Oral Oral  Resp:  18 20 20   Height:      Weight:      SpO2:   92% 96%   CBG (last 3)  Basename 10/25/11 0735 10/24/11 2028 10/24/11 1720  GLUCAP 211* 245* 132*   Intake/Output from previous day: 02/13 0701 - 02/14 0700 In: -  Out: 2400 [Urine:2400]  IV Fluid Intake:     . DISCONTD: sodium chloride 75 mL/hr at 10/24/11 0401   Medications    . aspirin  300 mg Rectal Daily   Or  . aspirin  325 mg Oral Daily  . clopidogrel  75 mg Oral Q breakfast  . enoxaparin  40 mg Subcutaneous Q24H  . fentaNYL      . glimepiride  4 mg Oral QAC lunch  . heparin      . heparin      . insulin aspart  0-20 Units Subcutaneous TID WC  . insulin aspart  10 Units Subcutaneous TID WC  . insulin glargine  60 Units Subcutaneous Daily  .  isosorbide mononitrate  30 mg Oral Daily  . lidocaine      . midazolam      . midazolam      . nitroGLYCERIN      . pantoprazole  40 mg Oral Q1200  . ramipril  10 mg Oral BID  . verapamil      . DISCONTD: diazepam  5 mg Oral On Call  . DISCONTD: enoxaparin  40 mg Subcutaneous Q24H  . DISCONTD: insulin glargine  30 Units Subcutaneous Daily  . DISCONTD: sodium chloride  3 mL Intravenous Q12H  PRN acetaminophen, diphenhydrAMINE, diphenoxylate-atropine, LORazepam, nitroGLYCERIN, ondansetron (ZOFRAN) IV, senna-docusate, temazepam, DISCONTD: sodium chloride, DISCONTD: acetaminophen, DISCONTD: sodium chloride  Diet:  Carb Control thin liquids Activity:  Ambulate in hall DVT Prophylaxis:  Lovenox 40 mg sq daily   Significant Diagnostic Studies: CBC    Component Value Date/Time   WBC 8.1 10/24/2011 2217   RBC 4.91 10/24/2011 2217   HGB 14.9 10/24/2011 2217   HCT 43.2 10/24/2011 2217   PLT 223 10/24/2011 2217   MCV 88.0 10/24/2011 2217   MCH 30.3 10/24/2011 2217   MCHC 34.5 10/24/2011 2217   RDW 13.4 10/24/2011 2217   LYMPHSABS  1.5 10/18/2011 2135   MONOABS 0.8 10/18/2011 2135   EOSABS 0.2 10/18/2011 2135   BASOSABS 0.1 10/18/2011 2135   CMP    Component Value Date/Time   NA 134* 10/25/2011 0500   K 5.0 10/25/2011 0500   CL 101 10/25/2011 0500   CO2 26 10/25/2011 0500   GLUCOSE 217* 10/25/2011 0500   BUN 14 10/25/2011 0500   CREATININE 0.63 10/25/2011 0500   CALCIUM 9.0 10/25/2011 0500   PROT 6.7 10/19/2011 0200   ALBUMIN 3.2* 10/19/2011 0200   AST 13 10/19/2011 0200   ALT 17 10/19/2011 0200   ALKPHOS 78 10/19/2011 0200   BILITOT 0.4 10/19/2011 0200   GFRNONAA >90 10/25/2011 0500   GFRAA >90 10/25/2011 0500   COAGS Lab Results  Component Value Date   INR 0.96 10/23/2011   INR 0.99 10/18/2011   INR 1.03 10/10/2011   Lipid Panel    Component Value Date/Time   CHOL 168 10/19/2011 0200   TRIG 135 10/19/2011 0200   HDL 30* 10/19/2011 0200   CHOLHDL 5.6 10/19/2011 0200   VLDL 27 10/19/2011 0200   LDLCALC 111*  10/19/2011 0200   HgbA1C  Lab Results  Component Value Date   HGBA1C 9.9* 10/19/2011   HGBA1C 10.0* 10/19/2011   Urine Drug Screen    Component Value Date/Time   LABOPIA NONE DETECTED 10/10/2011 0253   COCAINSCRNUR NONE DETECTED 10/10/2011 0253   LABBENZ NONE DETECTED 10/10/2011 0253   AMPHETMU NONE DETECTED 10/10/2011 0253   THCU NONE DETECTED 10/10/2011 0253   LABBARB NONE DETECTED 10/10/2011 0253    Alcohol Level    Component Value Date/Time   ETH <11 10/10/2011 0300    Nm Myocar Multi W/spect W/wall Motion / Ef 1.  Mildly depressed quantitative left ventricular ejection fraction of 44% without significant focal wall motion abnormalities. 2.  Possible small focal area of ischemia in the distal anteroseptal wall. 3.  Inferior wall attenuation which is likely attributable to diaphragmatic attenuation and visible intense activity in the liver on both rest and stress acquisitions.  A component of inferior wall scar cannot be excluded.     CT of the brain   10/21/2011  No evidence of acute intracranial abnormality.  Mild atrophy with small vessel ischemic changes. 10/18/2011  1. Negative for bleed or other acute intracranial process. 10/10/2011 Chronic atrophy and small vessel ischemic changes.  A mild opacification of some ethmoid air cells.  No evidence of acute intracranial hemorrhage, mass lesion, or acute infarct.   MRI of the brain  canceled  MRA of the brain  canceled  2D Echocardiogram  50-55% with no source of embolus.   Carotid Doppler  No internal carotid artery stenosis bilaterally. Vertebrals with antegrade flow bilaterally.   CXR  10/18/2011   1.  Cardiomegaly with   improvement in interstitial edema since previous exam. 10/10/2011  Mild cardiac enlargement with increased pulmonary vascularity and hazy opacities suggesting mild edema.  EKG  normal sinus rhythm.   Physical Exam  Pleasant obese middle-aged Caucasian male currently not in distress. Afebrile. Head is nontraumatic.  Neck is supple without bruit. Cardiac exam regular heart sounds no murmur or gallop noted. Lungs are clear to auscultation. Abdomen distended nontender. Neurological exam awake alert oriented x3 with normal speech and language function. Extraocular movements are full range without nystagmus. Visual fields are full to confrontational testing. Face is symmetric without weakness. Tongue is midline. Motor system exam reveals no upper or lower the drift. Mild weakness of  the right grip and intrinsic hand muscles. Orbits left or right upper extremity. Fine finger movements are diminished on the right. He has diminished touch pinprick sensation on the right hemibody as well as glove and stocking anesthesia in both feet from knee down. Position and vibration sensations are diminished from ankle down bilaterally. Deep tendon reflexes are 2+ symmetric except knee jerks and ankle jerks are depressed. Plantars are downgoing. Gait was not tested. ASSESSMENT [name] is a 66 y.o. male with a small left brain stem infarct that occurred on Friday prior to catheterization,. Infarct secondary to small vessel disease. Currently on aspirin 325 mg and Plavix 75mg  orally every day for secondary stroke prevention.  - Chest pain with long distal RCA disease involving PDA. Felt high risk for PCI - on medical management  - Ventricular tachycardia with several episodes of LOC during hospital stay unfortunately not on monitor at the time  - DM, poorly controlled with elevated HgbA1c - diabetic neuropathy - HTN - obesity class III, Body mass index is 50.43 kg/(m^2). - dyslipidemia, LDL 111 - likely OSA, needs outpatient evalutation  Hospital day # 7  TREATMENT/PLAN - Continue aspirin 325 mg orally every day and clopidogrel 75 mg orally every day for secondary stroke prevention. -  Ongoing risk factor control - Stroke Service will sign off. Follow up with Dr. Leonie Man in 2 months. Leonie Man will arrange OP MRI as well  as OP sleep evaluation.   Steffanie Rainwater, ANP-BC, GNP-BC [facility] Pager: [phone] 10/25/2011 7:58 AM  Dr. Antony Contras, [place] Director, has personally reviewed chart, pertinent data, examined the patient and developed the plan of care.

## 2011-10-25 NOTE — Progress Notes (Signed)
Inpatient Diabetes Program Recommendations  AACE/ADA: New Consensus Statement on Inpatient Glycemic Control (2009)  Target Ranges:  Prepandial:   less than 140 mg/dL      Peak postprandial:   less than 180 mg/dL (1-2 hours)      Critically ill patients:  140 - 180 mg/dL  Fasting CBGs >200  Inpatient Diabetes Program Recommendations Insulin - Basal: Increase Lantus to 65 units Correction (SSI): . Insulin - Meal Coverage: . Outpatient Referral: OP Diabetes Education consult for uncontrolled DM ( HgbA1C - 10.1%)  Raoul Pitch RN,BSN,CDE Inpatient Diabetes Coordinator

## 2011-10-26 DIAGNOSIS — I472 Ventricular tachycardia: Secondary | ICD-10-CM

## 2011-10-26 DIAGNOSIS — I471 Supraventricular tachycardia: Secondary | ICD-10-CM

## 2011-10-26 LAB — CREATININE, SERUM
Creatinine, Ser: 0.65 mg/dL (ref 0.50–1.35)
GFR calc Af Amer: >90 mL/min
GFR calc non Af Amer: >90 mL/min

## 2011-10-26 LAB — GLUCOSE, CAPILLARY
Glucose-Capillary: 184 mg/dL — ABNORMAL HIGH (ref 70–99)
Glucose-Capillary: 141 mg/dL — ABNORMAL HIGH (ref 70–99)

## 2011-10-26 NOTE — Consult Note (Signed)
Admit date: 10/18/2011 Referring Physician  Dr.  Radford Pax Primary Physician  [name] Reason for Consultation  NSVT  HPI:  [name] is a 66 y.o. male with history of morbid obesity, DM, HTN and OSA who presented to the ER with complaints of dizziness, chest pressure and right arm numbness.  He has had an extensive workup.  By echo, his EF is preserved.  By cath he has RCA disease, not felt to be amenable to revascularization. Post cath, he had an episodes of transient arm weakness and brief unresponsiveness of unclear etiology.  He was not on telemetry at the time.  Dr Leonie Man is suspicous for stroke. The patient has had NSVT on telemetry since but has been asymptomatic with this. Presently, he is resting comfortably and is without complaint. He reports that his quality of life is poor.  He admits to being mostly sedentary.  He states that chronic knee pain, obesity, and SOB are limiting factors.   PMH:   Past Medical History  Diagnosis Date  . Obese   . Hypertension   . Diabetes mellitus   . Diabetic diarrhea   . Sleep apnea   . Diabetic neuropathy, painful   . Chronic indwelling foley catheter   . Bladder spasms   . Complication of anesthesia   . PONV (postoperative nausea and vomiting)      PSH:   Past Surgical History  Procedure Date  . Joint replacement     Left Total Knee  . Gastroplasty   . Cholecystectomy     Allergies:  Cefadroxil; Cephalexin; Lipitor; Metformin and related; Morphine and related; and Robaxin Prior to Admit Meds:   Prescriptions prior to admission  Medication Sig Dispense Refill  . ciprofloxacin (CIPRO) 500 MG tablet Take 500 mg by mouth 2 (two) times daily.      . diphenhydrAMINE (BENADRYL) 25 mg capsule Take 25 mg by mouth every 6 (six) hours as needed. For allergy.      . diphenoxylate-atropine (LOMOTIL) 2.5-0.025 MG per tablet Take 2 tablets by mouth 4 (four) times daily as needed. Diarrhea      . glimepiride (AMARYL) 4 MG tablet Take 4  mg by mouth daily before lunch.       . insulin NPH-insulin regular (NOVOLIN 70/30) (70-30) 100 UNIT/ML injection Inject 55 Units into the skin 2 (two) times daily with a meal.      . Melatonin 3 MG CAPS Take 3 mg by mouth at bedtime as needed. Insomnia      . omeprazole (PRILOSEC) 20 MG capsule Take 20 mg by mouth daily.      Marland Kitchen oxybutynin (DITROPAN) 5 MG tablet Take 5 mg by mouth 3 (three) times daily.      . ramipril (ALTACE) 10 MG capsule Take 10 mg by mouth daily.       Fam HX:   History reviewed. No pertinent family history. Social HX:    History   Social History  . Marital Status: Single    Spouse Name: N/A    Number of Children: N/A  . Years of Education: N/A   Occupational History  . Not on file.   Social History Main Topics  . Smoking status: Former Smoker -- 2.0 packs/day  . Smokeless tobacco: Never Used  . Alcohol Use: No  . Drug Use: No  . Sexually Active: No   Other Topics Concern  . Not on file   Social History Narrative  . No narrative on file  ROS:  All 11 ROS were addressed and are negative except what is stated in the HPI  Physical Exam: Blood pressure 103/49, pulse 64, temperature 97.6 F (36.4 C), temperature source Oral, resp. rate 18, height 6\' 1"  (1.854 m), weight 382 lb 3.2 oz (173.365 kg), SpO2 97.00%.    General:  Morbidly obese and debilitated, in no acute distressd Head: Eyes PERRLA, No xanthomas.   Normal cephalic and atramatic  Lungs:   Clear bilaterally to auscultation and percussion. Heart:   HRRR S1 S2 Pulses are 2+ & equal.            No carotid bruit. No JVD.  No abdominal bruits. No femoral bruits. Abdomen: Bowel sounds are positive, abdomen soft and non-tender without masses Extremities:   No clubbing, cyanosis or edema.  DP +1 Neuro: Alert and oriented X 3. Psych:  Good affect, responds appropriately    Labs:   Lab Results  Component Value Date   WBC 8.1 10/24/2011   HGB 14.9 10/24/2011   HCT 43.2 10/24/2011   MCV 88.0  10/24/2011   PLT 223 10/24/2011     Lab 10/26/11 0600 10/25/11 0500  NA -- 134*  K -- 5.0  CL -- 101  CO2 -- 26  BUN -- 14  CREATININE 0.65 --  CALCIUM -- 9.0  PROT -- --  BILITOT -- --  ALKPHOS -- --  ALT -- --  AST -- --  GLUCOSE -- 217*   No results found for this basename: PTT   Lab Results  Component Value Date   INR 0.96 10/23/2011   INR 0.99 10/18/2011   INR 1.03 10/10/2011   Lab Results  Component Value Date   CKTOTAL 42 10/19/2011   CKMB 2.8 10/19/2011   TROPONINI <0.30 10/19/2011     Lab Results  Component Value Date   CHOL 168 10/19/2011   CHOL  Value: 162        ATP III CLASSIFICATION:  <200     mg/dL   Desirable  200-239  mg/dL   Borderline High  >=240    mg/dL   High        08/26/2010   Lab Results  Component Value Date   HDL 30* 10/19/2011   HDL 28* 08/26/2010   Lab Results  Component Value Date   LDLCALC 111* 10/19/2011   LDLCALC  Value: 106        Total Cholesterol/HDL:CHD Risk Coronary Heart Disease Risk Table                     Men   Women  1/2 Average Risk   3.4   3.3  Average Risk       5.0   4.4  2 X Average Risk   9.6   7.1  3 X Average Risk  23.4   11.0        Use the calculated Patient Ratio above and the CHD Risk Table to determine the patient's CHD Risk.        ATP III CLASSIFICATION (LDL):  <100     mg/dL   Optimal  100-129  mg/dL   Near or Above                    Optimal  130-159  mg/dL   Borderline  160-189  mg/dL   High  >190     mg/dL   Very High* 08/26/2010   Lab Results  Component Value Date  TRIG 135 10/19/2011   TRIG 140 08/26/2010   Lab Results  Component Value Date   CHOLHDL 5.6 10/19/2011   CHOLHDL 5.8 08/26/2010   No results found for this basename: LDLDIRECT     EKG:  NSR with LAFB and PVC's (not closely coupled to preceding t wave)  ASSESSMENT/Plan:   Pt with recurrent episodes of brief/ transient LOC of unclear etiology.  Dr Leonie Man suggests small brain stem infarct as a possible cause.  Ventricular arrhythmias seem less likely in  the setting of a preserved EF though he has had asymptomatic nonsustained VT since.  I cannot also exclude a bradycardic arrhythmia.  Unfortunately, he was not on telemetry when the events occurred. I think that the most prudent action at this point is to watch him over the weekend on telemetry to see if symptoms recur.  Further evaluation is deferred pending weekend observation on telemetry.  Given his size and co morbidities, he would be a poor candidate for EP procedures.  EP will see again on Monday.   Thompson Grayer, MD  10/26/2011  6:14 PM

## 2011-10-26 NOTE — Progress Notes (Signed)
PT Cancellation Note  Treatment cancelled today due to patient's refusal to participate.  Pt on phone upon arrival.  States he needed to finish conversation and "may be a while".  Offered to return later yet patient refused.  States "I didn't think you were coming today."  Narda Bonds 10/26/2011, 11:18 AM  Narda Bonds, [place] [phone]

## 2011-10-26 NOTE — Progress Notes (Signed)
Patient agreed to let us place him back on the telemetry monitor when he stated that he felt "different" and "un-well". Vital signs assessed and patient was in NSR with a heart rate of 65. Will continue to monitor.  Estelle Grumbles, RN 10/25/11

## 2011-10-26 NOTE — Progress Notes (Signed)
Subjective: No further complaints.  Objective: Vital signs in last 24 hours: Temp:  [97.5 F (36.4 C)-97.6 F (36.4 C)] 97.6 F (36.4 C) (02/15 0500) Pulse Rate:  [60-68] 65  (02/15 0500) Resp:  [18] 18  (02/15 0500) BP: (108-134)/(67-76) 134/76 mmHg (02/15 0500) SpO2:  [97 %-98 %] 98 % (02/15 0500) Weight change:  Last BM Date: 10/25/11  Intake/Output from previous day: 02/14 0701 - 02/15 0700 In: 1080 [P.O.:1080] Out: 2600 [Urine:2600] Intake/Output this shift:    General appearance: alert and cooperative Resp: clear to auscultation bilaterally Cardio: regular rate and rhythm, S1, S2 normal, no murmur, click, rub or gallop  Lab Results:  [facility] 10/24/11 2217 10/23/11 1912  WBC 8.1 7.8  HGB 14.9 15.0  HCT 43.2 44.3  PLT 223 250   BMET  Basename 10/26/11 0600 10/25/11 0500 10/23/11 1912  NA -- 134* 137  K -- 5.0 4.0  CL -- 101 100  CO2 -- 26 28  GLUCOSE -- 217* 129*  BUN -- 14 13  CREATININE 0.65 0.63 --  CALCIUM -- 9.0 9.3    Studies/Results: No results found.  Medications: I have reviewed the patient's current medications.  Assessment/Plan: Principal Problem:  CAD  Medical therapy.  He has been statin intolerant in the past Active Problems:  V Tach/LOC  Will discuss with cardiology  L Subcortical CVA  [place] on ASA/Plavix  DM (diabetes mellitus) with complications better controlled on current regimen  Hypertension ok  Disposition  Hope to D/C early next week  With [facility]   LOS: 8 days   Ursala Cressy JOSEPH 10/26/2011, 8:14 AM

## 2011-10-27 LAB — GLUCOSE, CAPILLARY
Glucose-Capillary: 197 mg/dL — ABNORMAL HIGH (ref 70–99)
Glucose-Capillary: 134 mg/dL — ABNORMAL HIGH (ref 70–99)
Glucose-Capillary: 104 mg/dL — ABNORMAL HIGH (ref 70–99)

## 2011-10-27 NOTE — Progress Notes (Signed)
Patient strongly encouraged to ambulate today as ordered. He states he did ambulate a few steps in room but was resistant to ambulating further. C/O left knee pain. Prn pain med given on request. Jonna Coup [facility]

## 2011-10-27 NOTE — Progress Notes (Signed)
SUBJECTIVE:   No arrhythmias overnight.  OBJECTIVE:   Vitals:   Filed Vitals:   10/26/11 1500 10/26/11 2100 10/26/11 2127 10/27/11 0508  BP: 103/49 116/58 117/86 140/66  Pulse: 64 81  82  Temp: 97.6 F (36.4 C) 97.7 F (36.5 C)  98 F (36.7 C)  TempSrc: Oral Oral  Oral  Resp: 18 20  18   Height:      Weight:      SpO2: 97% 94%  98%   I&O's:   Intake/Output Summary (Last 24 hours) at 10/27/11 1113 Last data filed at 10/27/11 1013  Gross per 24 hour  Intake   1080 ml  Output   3300 ml  Net  -2220 ml   TELEMETRY: Reviewed telemetry pt in normal sinus rhythm     PHYSICAL EXAM General: Obese Head:  Normal cephalic and atramatic  Lungs:  No wheezing Heart:   HRRR S1 S2 Abdomen: Obese Msk:  Back normal, . Normal strength and tone for age. Extremities:   Skin changes consistent with chronic venous stasis.  1+ edema of both ankles. Neuro: Alert and oriented X 3. Psych:  Good affect, responds appropriately   LABS: Basic Metabolic Panel:  Basename 10/26/11 0600 10/25/11 0500  NA -- 134*  K -- 5.0  CL -- 101  CO2 -- 26  GLUCOSE -- 217*  BUN -- 14  CREATININE 0.65 0.63  CALCIUM -- 9.0  MG -- --  PHOS -- --   Liver Function Tests: No results found for this basename: AST:2,ALT:2,ALKPHOS:2,BILITOT:2,PROT:2,ALBUMIN:2 in the last 72 hours No results found for this basename: LIPASE:2,AMYLASE:2 in the last 72 hours CBC:  Basename 10/24/11 2217  WBC 8.1  NEUTROABS --  HGB 14.9  HCT 43.2  MCV 88.0  PLT 223   Cardiac Enzymes: No results found for this basename: CKTOTAL:3,CKMB:3,CKMBINDEX:3,TROPONINI:3 in the last 72 hours BNP: No components found with this basename: POCBNP:3 D-Dimer: No results found for this basename: DDIMER:2 in the last 72 hours Hemoglobin A1C: No results found for this basename: HGBA1C in the last 72 hours Fasting Lipid Panel: No results found for this basename: CHOL,HDL,LDLCALC,TRIG,CHOLHDL,LDLDIRECT in the last 72 hours Thyroid Function  Tests: No results found for this basename: TSH,T4TOTAL,FREET3,T3FREE,THYROIDAB in the last 72 hours Anemia Panel: No results found for this basename: VITAMINB12,FOLATE,FERRITIN,TIBC,IRON,RETICCTPCT in the last 72 hours Coag Panel:   Lab Results  Component Value Date   INR 0.96 10/23/2011   INR 0.99 10/18/2011   INR 1.03 10/10/2011    RADIOLOGY: Dg Chest 2 View  10/18/2011  *RADIOLOGY REPORT*  Clinical Data: Chest tightness  CHEST - 2 VIEW  Comparison: 10/10/2011  Findings: Mild cardiomegaly.  Vascular clips in the left upper abdomen.  Relatively low lung volumes.  Some improvement in interstitial edema since previous exam.  No effusion.  Mild compression deformities in the lower thoracic spine   stable.  IMPRESSION:  1.  Cardiomegaly with   improvement in interstitial edema since previous exam.  Original Report Authenticated By: Trecia Rogers, M.D.   Ct Head Wo Contrast  10/21/2011  *RADIOLOGY REPORT*  Clinical Data: Arm numbness, evaluate for CVA  CT HEAD WITHOUT CONTRAST  Technique:  Contiguous axial images were obtained from the base of the skull through the vertex without contrast.  Comparison: 10/18/2011  Findings: No evidence of parenchymal hemorrhage or extra-axial fluid collection. No mass lesion, mass effect, or midline shift.  No CT evidence of acute infarction.  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  Mild subcortical white matter  and periventricular small vessel ischemic changes.  Mild global cortical atrophy.  No ventriculomegaly.  The visualized paranasal sinuses are essentially clear. The mastoid air cells are unopacified.  No evidence of calvarial fracture.  IMPRESSION: No evidence of acute intracranial abnormality.  Mild atrophy with small vessel ischemic changes.  Original Report Authenticated By: Julian Hy, M.D.        ASSESSMENT: Syncope, coronary artery disease  PLAN:  Telemetry reviewed.  He had nonsustained ventricular tachycardia on February 14.  Since then, no  significant arrhythmias.  He will be walking more during the day.  We'll see if he has any issues with lightheadedness or syncope.  Appreciate EP input.  Continue to watch on telemetry.  No angina.  Long lesion in his distal right coronary artery which is being medically managed.  Jettie Booze., MD  10/27/2011  11:13 AM

## 2011-10-27 NOTE — Progress Notes (Signed)
Subjective: Pt without c/o No event last night of Syncope  Objective: Vital signs in last 24 hours: Temp:  [97.6 F (36.4 C)-98 F (36.7 C)] 98 F (36.7 C) (02/16 0508) Pulse Rate:  [64-82] 82  (02/16 0508) Resp:  [18-20] 18  (02/16 0508) BP: (103-150)/(49-86) 140/66 mmHg (02/16 0508) SpO2:  [94 %-98 %] 98 % (02/16 0508) Weight change:  Last BM Date: 10/25/11  Intake/Output from previous day: 02/15 0701 - 02/16 0700 In: 960 [P.O.:960] Out: 3200 [Urine:3200] Intake/Output this shift:    General appearance: alert and cooperative Resp: clear to auscultation bilaterally Cardio: regular rate and rhythm, S1, S2 normal, no murmur, click, rub or gallop  Lab Results:  [facility] 10/24/11 2217  WBC 8.1  HGB 14.9  HCT 43.2  PLT 223   BMET  Basename 10/26/11 0600 10/25/11 0500  NA -- 134*  K -- 5.0  CL -- 101  CO2 -- 26  GLUCOSE -- 217*  BUN -- 14  CREATININE 0.65 0.63  CALCIUM -- 9.0    Studies/Results: No results found.  Medications: I have reviewed the patient's current medications.  Assessment/Plan: Dizzy ? NSVT episode-  Card note- EP eval Telemetry with ambulation to observe CVA small PT therapy CAD s/p Cath med management OOB ambulate   LOS: 9 days   Gabrien Mentink 10/27/2011, 8:27 AM

## 2011-10-28 LAB — GLUCOSE, CAPILLARY
Glucose-Capillary: 146 mg/dL — ABNORMAL HIGH (ref 70–99)
Glucose-Capillary: 177 mg/dL — ABNORMAL HIGH (ref 70–99)

## 2011-10-28 MED ORDER — HYDROCODONE-ACETAMINOPHEN 5-325 MG PO TABS
1.0000 | ORAL_TABLET | Freq: Four times a day (QID) | ORAL | Status: DC | PRN
  Administered 2011-10-28 – 2011-10-29 (×4): 1 via ORAL
  Filled 2011-10-28 (×4): qty 1

## 2011-10-28 NOTE — Progress Notes (Signed)
Subjective: C/o left knee pain with walking-OA No arrythmia overnight on telemetry No CP  Objective: Vital signs in last 24 hours: Temp:  [97.4 F (36.3 C)-97.7 F (36.5 C)] 97.5 F (36.4 C) (02/17 0500) Pulse Rate:  [62-75] 75  (02/17 0500) Resp:  [18] 18  (02/17 0500) BP: (108-132)/(52-69) 131/69 mmHg (02/17 0500) SpO2:  [92 %-96 %] 96 % (02/17 0500) Weight change:  Last BM Date: 10/25/11  Intake/Output from previous day: 02/16 0701 - 02/17 0700 In: 1320 [P.O.:1320] Out: 4600 [Urine:4600] Intake/Output this shift: Total I/O In: -  Out: 1300 [Urine:1300]  General appearance: cooperative Cardio: regular rate and rhythm, S1, S2 normal, no murmur, click, rub or gallop Extremities: no edema, redness or tenderness in the calves or thighs  Lab Results: No results found for this basename: WBC:2,HGB:2,HCT:2,PLT:2 in the last 72 hours BMET  Basename 10/26/11 0600  NA --  K --  CL --  CO2 --  GLUCOSE --  BUN --  CREATININE 0.65  CALCIUM --    Studies/Results: No results found.  Medications: I have reviewed the patient's current medications.  Assessment/Plan: CAD/ medical management ok  BP ok Syncopal episode- no arrythmia with walking or overnight- BP ok Knee pain- OA, cannot takeNSAID vicodin prn Continue OOB  LOS: 10 days   [name] 10/28/2011, 8:21 AM

## 2011-10-28 NOTE — Progress Notes (Signed)
Patient continues to refuse ambulating in hallway, patient states that he will not walk in hallway because he is weak_____________________________________________________________________________D. Owens Shark RN

## 2011-10-28 NOTE — Progress Notes (Signed)
SUBJECTIVE:   No arrhythmias overnight.  No dizziness or lightheadedness.  OBJECTIVE:   Vitals:   Filed Vitals:   10/27/11 1400 10/27/11 2100 10/28/11 0500 10/28/11 1010  BP: 108/52 132/61 131/69 133/71  Pulse: 67 62 75 77  Temp: 97.4 F (36.3 C) 97.7 F (36.5 C) 97.5 F (36.4 C) 97.3 F (36.3 C)  TempSrc: Oral   Oral  Resp: 18 18 18 18   Height:      Weight:      SpO2: 94% 92% 96% 98%   I&O's:    Intake/Output Summary (Last 24 hours) at 10/28/11 1124 Last data filed at 10/28/11 1015  Gross per 24 hour  Intake   1200 ml  Output   4600 ml  Net  -3400 ml   TELEMETRY: Reviewed telemetry pt in normal sinus rhythm     PHYSICAL EXAM General: Obese Head:  Normal cephalic and atramatic  Lungs:  No wheezing Heart:   HRRR S1 S2 Abdomen: Obese Msk:  Back normal, . Normal strength and tone for age. Extremities:   Skin changes consistent with chronic venous stasis.  1+ edema of both ankles. Neuro: Alert and oriented X 3. Psych:  Good affect, responds appropriately   LABS: Basic Metabolic Panel:  Basename 10/26/11 0600  NA --  K --  CL --  CO2 --  GLUCOSE --  BUN --  CREATININE 0.65  CALCIUM --  MG --  PHOS --   Liver Function Tests: No results found for this basename: AST:2,ALT:2,ALKPHOS:2,BILITOT:2,PROT:2,ALBUMIN:2 in the last 72 hours No results found for this basename: LIPASE:2,AMYLASE:2 in the last 72 hours CBC: No results found for this basename: WBC:2,NEUTROABS:2,HGB:2,HCT:2,MCV:2,PLT:2 in the last 72 hours Cardiac Enzymes: No results found for this basename: CKTOTAL:3,CKMB:3,CKMBINDEX:3,TROPONINI:3 in the last 72 hours BNP: No components found with this basename: POCBNP:3 D-Dimer: No results found for this basename: DDIMER:2 in the last 72 hours Hemoglobin A1C: No results found for this basename: HGBA1C in the last 72 hours Fasting Lipid Panel: No results found for this basename: CHOL,HDL,LDLCALC,TRIG,CHOLHDL,LDLDIRECT in the last 72 hours Thyroid  Function Tests: No results found for this basename: TSH,T4TOTAL,FREET3,T3FREE,THYROIDAB in the last 72 hours Anemia Panel: No results found for this basename: VITAMINB12,FOLATE,FERRITIN,TIBC,IRON,RETICCTPCT in the last 72 hours Coag Panel:   Lab Results  Component Value Date   INR 0.96 10/23/2011   INR 0.99 10/18/2011   INR 1.03 10/10/2011    RADIOLOGY: Dg Chest 2 View  10/18/2011  *RADIOLOGY REPORT*  Clinical Data: Chest tightness  CHEST - 2 VIEW  Comparison: 10/10/2011  Findings: Mild cardiomegaly.  Vascular clips in the left upper abdomen.  Relatively low lung volumes.  Some improvement in interstitial edema since previous exam.  No effusion.  Mild compression deformities in the lower thoracic spine   stable.  IMPRESSION:  1.  Cardiomegaly with   improvement in interstitial edema since previous exam.  Original Report Authenticated By: Trecia Rogers, M.D.   Ct Head Wo Contrast  10/21/2011  *RADIOLOGY REPORT*  Clinical Data: Arm numbness, evaluate for CVA  CT HEAD WITHOUT CONTRAST  Technique:  Contiguous axial images were obtained from the base of the skull through the vertex without contrast.  Comparison: 10/18/2011  Findings: No evidence of parenchymal hemorrhage or extra-axial fluid collection. No mass lesion, mass effect, or midline shift.  No CT evidence of acute infarction.  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  Mild subcortical white matter and periventricular small vessel ischemic changes.  Mild global cortical atrophy.  No ventriculomegaly.  The visualized paranasal sinuses are essentially clear. The mastoid air cells are unopacified.  No evidence of calvarial fracture.  IMPRESSION: No evidence of acute intracranial abnormality.  Mild atrophy with small vessel ischemic changes.  Original Report Authenticated By: Julian Hy, M.D.        ASSESSMENT: Syncope, coronary artery disease  PLAN:  Telemetry reviewed.  He had nonsustained ventricular tachycardia on February 14.  Since  then, no significant arrhythmias.  He had a triplet yesterday.  Walking yesterday was limited by knee pain. He will be walking more during the day today.  We'll see if he has any issues with lightheadedness or syncope.  Appreciate EP input.  Continue to watch on telemetry.  No angina.  Long lesion in his distal right coronary artery which is being medically managed.  Jettie Booze., MD  10/28/2011  11:24 AM

## 2011-10-28 NOTE — Progress Notes (Signed)
Patient refuses to ambulate in hallways, but states he will walk around in room____________D. Owens Shark RN

## 2011-10-29 MED ORDER — NITROGLYCERIN 0.4 MG SL SUBL: 0.4000 mg | SUBLINGUAL_TABLET | SUBLINGUAL | Status: DC | PRN

## 2011-10-29 MED ORDER — HYDROCODONE-ACETAMINOPHEN 5-325 MG PO TABS: 1.0000 | ORAL_TABLET | Freq: Four times a day (QID) | ORAL | Status: AC | PRN

## 2011-10-29 MED ORDER — ASPIRIN 81 MG PO TABS: 81.0000 mg | ORAL_TABLET | Freq: Every day | ORAL | Status: DC

## 2011-10-29 MED ORDER — METOPROLOL TARTRATE 12.5 MG HALF TABLET: 25.0000 mg | ORAL_TABLET | Freq: Two times a day (BID) | ORAL | Status: DC

## 2011-10-29 MED ORDER — CLOPIDOGREL BISULFATE 75 MG PO TABS: 75.0000 mg | ORAL_TABLET | Freq: Every day | ORAL | Status: AC

## 2011-10-29 NOTE — Progress Notes (Signed)
Received phone call back from patient.  Explained stroke discharge instructions where left out of his pape work.  Reviewed signs and symptoms of stroke and importance of calling 911 if he experiences any.  Reviewed lab work with patient.  Also informed patient I would mail information to his address.  Patient stated his understanding.  Sanda Linger

## 2011-10-29 NOTE — Progress Notes (Signed)
Reviewed discharge instructions with patient and he stated his understanding.  Discussed with patient not driving for 6 months per MD recommendations.  Social Work spoke with patient concerning transportation options.  Stroke discharge instructions not in original discharge paper work.  Discharge instructions reprinted with Stroke discharge and mailed to patient.  Called and left patient a message stating new discharge instructions would be mailed and to call with any questions.  Sanda Linger

## 2011-10-29 NOTE — Discharge Summary (Signed)
Physician Discharge Summary  Patient ID: [name] MRN: [id number] DOB/AGE: 11-05-1945 66 y.o.  Admit date: 10/18/2011 Discharge date: 10/29/2011  Admission Diagnoses: Chest pain Arm numbness Diabetes mellitus type 2 Hypertension Dizziness  Discharge Diagnoses:  Principal Problem: Coronary artery disease Active Problems: Left subcortical CVA Nonsustained ventricular tachycardia  DM (diabetes mellitus) with complications  OSA (obstructive sleep apnea)  Hypertension  Dizziness    Discharged Condition: good  Hospital Course: The patient was admitted with complaints of right arm numbness, dizziness and chest pain. As for his chest pain his cardiac enzymes were negative. His EKG was nonacute. He was seen by the cardiology service. He did have a nuclear medicine myocardial Lexa scan study after having recurrent chest pain at rest on hospitalization. This study showed a mildly depressed left ventricular ejection fraction of 44% without significant focal wall motion abnormality and a possible small focal area of ischemia in the distal anteroseptal wall. The patient did have some recurrent chest pain after the study and underwent cardiac catheterization on February 13 via the radial artery approach by Dr. Marlou Porch which showed significant distal right coronary artery disease with a diffuse long segment up to 90% lesion at the bifurcation of a small PDA, 50% stenosis first diagonal and elevated left ventricular end-diastolic pressure consistent with diastolic dysfunction.this right coronary lesion was felt to be high risk to benefit for intervention and medical therapy was recommended with ACE inhibitor, aspirin, Plavix and beta blocker. The patient remained chest pain-free throughout the rest of hospitalization. He did have several episodes of ventricular tachycardia longest was 18 beats. These are asymptomatic. He did have several episodes of brief apparent loss of consciousness which usually  occur while being transported to or transferred during the procedure, unfortunately these episodes were not on the monitor. Prolonged telemetry monitoring did not show any other abnormality other than the ventricular tachycardia mentioned above. The patient was seen by Dr. Rayann Heman M.D. of the EP service who recommended continue telemetry monitoring and further evaluation only if significant arrhythmia was noted. As for the patient's right arm numbness this persisted throughout the hospitalization. The patient had initial CAT scan and then a follow up CAT scan on the third day neither of which showed a definite stroke. He was unable to undergo an MRI of the brain because of his weight. He was seen by the neurology service who felt he probably had a left brain subcortical cerebrovascular accident. The patient had no weakness only numbness in the right arm and right face. This was treated medically with aspirin and Plavix. The patient's diabetes was under good to fair control during his hospitalization and will need to be followed as an outpatient. His blood pressure remained under reasonable control. He did and bili with physical therapy on several occasions and they recommended considering nursing home placement which was declined the patient and therefore home health nursing and physical therapy was arranged. 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 History it with Vicodin  CODE STATUS: Full code Diet: Diabetic low sodium diet Activity: As tolerated  Consults: cardiology and neurology  Significant Diagnostic Studies: labs: , radiology: CXR: normal and CT scan: No acute CVA, nuclear medicine: As above and angiography: As above  Treatments: cardiac meds: ramipril (Altace), metoprolol and aspirin, Plavix, nitrates, insulin: regular and NPH and therapies: PT  Discharge Exam: Blood pressure 137/82, pulse 66,  temperature 97.5 F (36.4  C), temperature source Oral, resp. rate 18, height 6\' 1"  (1.854 m), weight 173.365 kg (382 lb 3.2 oz), SpO2 97.00%. Resp: clear to auscultation bilaterally Cardio: regular rate and rhythm, S1, S2 normal, no murmur, click, rub or gallop  Disposition: Home or Self Care   Medication List  As of 10/29/2011  7:35 AM   STOP taking these medications         ciprofloxacin 500 MG tablet      glimepiride 4 MG tablet         TAKE these medications         aspirin 81 MG tablet   Take 1 tablet (81 mg total) by mouth daily.      clopidogrel 75 MG tablet   Commonly known as: PLAVIX   Take 1 tablet (75 mg total) by mouth daily with breakfast.      diphenhydrAMINE 25 mg capsule   Commonly known as: BENADRYL   Take 25 mg by mouth every 6 (six) hours as needed. For allergy.      diphenoxylate-atropine 2.5-0.025 MG per tablet   Commonly known as: LOMOTIL   Take 2 tablets by mouth 4 (four) times daily as needed. Diarrhea      HYDROcodone-acetaminophen 5-325 MG per tablet   Commonly known as: NORCO   Take 1 tablet by mouth every 6 (six) hours as needed.      insulin NPH-insulin regular (70-30) 100 UNIT/ML injection   Commonly known as: NOVOLIN 70/30   Inject 55 Units into the skin 2 (two) times daily with a meal.      Melatonin 3 MG Caps   Take 3 mg by mouth at bedtime as needed. Insomnia      metoprolol tartrate 12.5 mg Tabs   Commonly known as: LOPRESSOR   Take 1 tablet (25 mg total) by mouth 2 (two) times daily.      nitroGLYCERIN 0.4 MG SL tablet   Commonly known as: NITROSTAT   Place 1 tablet (0.4 mg total) under the tongue every 5 (five) minutes x 3 doses as needed for chest pain.      omeprazole 20 MG capsule   Commonly known as: PRILOSEC   Take 20 mg by mouth daily.      oxybutynin 5 MG tablet   Commonly known as: DITROPAN   Take 5 mg by mouth 3 (three) times daily.      ramipril 10 MG capsule   Commonly known as: ALTACE   Take 10 mg by mouth  daily.           Follow-up Information    Follow up with Irven Shelling, MD in 10 days.         SignedIrven Shelling 10/29/2011, 7:35 AM

## 2011-10-29 NOTE — Progress Notes (Signed)
PT Cancellation Note  Treatment cancelled today due to pt d/cing home.  Sena Hitch 10/29/2011, 11:23 AM [phone]

## 2011-10-29 NOTE — Progress Notes (Signed)
SUBJECTIVE: The patient is doing well today and wants to go home.  Denies further presyncope or syncope. At this time, he denies chest pain, shortness of breath, or any new concerns.     Marland Kitchen aspirin  300 mg Rectal Daily   Or  . aspirin  325 mg Oral Daily  . clopidogrel  75 mg Oral Q breakfast  . enoxaparin  40 mg Subcutaneous Q24H  . glimepiride  4 mg Oral QAC lunch  . insulin aspart  0-20 Units Subcutaneous TID WC  . insulin aspart  10 Units Subcutaneous TID WC  . insulin glargine  60 Units Subcutaneous Daily  . isosorbide mononitrate  30 mg Oral Daily  . metoprolol tartrate  12.5 mg Oral BID  . pantoprazole  40 mg Oral Q1200  . ramipril  10 mg Oral BID      OBJECTIVE: Physical Exam: Filed Vitals:   10/28/11 1010 10/28/11 1400 10/28/11 2100 10/29/11 0500  BP: 133/71 109/53 132/67 137/82  Pulse: 77 66 69 66  Temp: 97.3 F (36.3 C) 97.3 F (36.3 C) 97.7 F (36.5 C) 97.5 F (36.4 C)  TempSrc: Oral Oral Oral Oral  Resp: 18 18 18 18   Height:      Weight:      SpO2: 98% 97% 96% 97%    Intake/Output Summary (Last 24 hours) at 10/29/11 [id number] Last data filed at 10/29/11 0500  Gross per 24 hour  Intake    960 ml  Output   2650 ml  Net  -1690 ml    Telemetry reveals sinus rhythm, rare PVCs, no NSVT  GEN- The patient is ovweright appearing, alert and oriented x 3 today.   Head- normocephalic, atraumatic Eyes-  Sclera clear, conjunctiva pink Ears- hearing intact Oropharynx- clear Neck- supple, no JVP Lymph- no cervical lymphadenopathy Lungs- Clear to ausculation bilaterally, normal work of breathing Heart- Regular rate and rhythm, no murmurs, rubs or gallops, PMI not laterally displaced GI- soft, NT, ND, + BS Extremities- no clubbing, cyanosis, or edema  ASSESSMENT AND PLAN:  Principal Problem:  *Chest pain Active Problems:  DM (diabetes mellitus) with complications  OSA (obstructive sleep apnea)  Hypertension  Dizziness  NSVT (nonsustained ventricular  tachycardia)  Pt with recurrent episodes of brief/ transient LOC felt by Dr Leonie Man to be due to a small brain stem infarct. He has asymptomatic NSVT which is controlled on metoprolol.  He has a preserved EF. As he has been stable over the weekend, without further arrhythmias, no further EP workup is planned. He is already scheduled to be discharged today. He should follow-up with [facility] cardiology and I will see him as needed.  No driving x 6 months s/p syncope with stroke. I have informed him of this requirement from [facility] today.  Thompson Grayer, MD 10/29/2011 8:07 AM

## 2011-10-29 NOTE — Progress Notes (Signed)
   CARE MANAGEMENT NOTE 10/29/2011  Patient:  DEMETRUS, WITHERSPOON   Account Number:  [id number]  Date Initiated:  10/29/2011  Documentation initiated by:  Llana Aliment  Subjective/Objective Assessment:   66yo male admitted with Chest Pain.     Action/Plan:   Discharge planning for [facility] services   Anticipated DC Date:  10/29/2011   Anticipated DC Plan:  [place] referral  Clinical Social Worker      DC Planning Services  CM consult      [facility] Choice  HOME HEALTH   Choice offered to / List presented to:  C-1 Patient        [place] arranged  [place] - 11 Patient Refused      Status of service:  Completed, signed off Medicare Important Message given?  NO (If response is "NO", the following Medicare IM given date fields will be blank) Date Medicare IM given:   Date Additional Medicare IM given:    Discharge Disposition:  HOME/SELF CARE  Per UR Regulation:  Reviewed for med. necessity/level of care/duration of stay  Comments:  10/29/11 0925 Spoke with pt. regarding [place] services for [facility] RN and HH PT.   Pt. states he cannot afford the $40 co-pay that he would have to pay for [facility] services.  Pt. refuses HH at this time.  TC to Judson Roch, RN to make her aware.  Also, the pt. will need transportation home today.  [name] has been called. Llana Aliment, RN, BSN [phone]

## 2011-10-29 NOTE — Discharge Instructions (Signed)
You can take nitroglycerin 0.4 mg on the your top if needed for chest pain. If you have any episode of chest pain lasting more than 30 minutes not relieved by nitroglycerin, go to the hospital. We will arrange home health for you.  STROKE/TIA DISCHARGE INSTRUCTIONS SMOKING Cigarette smoking nearly doubles your risk of having a stroke & is the single most alterable risk factor  If you smoke or have smoked in the last 12 months, you are advised to quit smoking for your health.  Most of the excess cardiovascular risk related to smoking disappears within a year of stopping.  Ask you doctor about anti-smoking medications  [place] Quit Line: 1-800-QUIT NOW  Free Smoking Cessation Classes [phone]  CHOLESTEROL Know your levels; limit fat & cholesterol in your diet  Lipid Panel     Component Value Date/Time   CHOL 168 10/19/2011 0200   TRIG 135 10/19/2011 0200   HDL 30* 10/19/2011 0200   CHOLHDL 5.6 10/19/2011 0200   VLDL 27 10/19/2011 0200   LDLCALC 111* 10/19/2011 0200      Many patients benefit from treatment even if their cholesterol is at goal.  Goal: Total Cholesterol (CHOL) less than 160  Goal:  Triglycerides (TRIG) less than 150  Goal:  HDL greater than 40  Goal:  LDL (LDLCALC) less than 100   BLOOD PRESSURE American Stroke Association blood pressure target is less that 120/80 mm/Hg  Your discharge blood pressure is:  BP: 139/71 mmHg  Monitor your blood pressure  Limit your salt and alcohol intake  Many individuals will require more than one medication for high blood pressure  DIABETES (A1c is a blood sugar average for last 3 months) Goal HGBA1c is under 7% (HBGA1c is blood sugar average for last 3 months)  Diabetes: Diagnosis of diabetes:  Your A1c:10.0 %    Lab Results  Component Value Date   HGBA1C 9.9* 10/19/2011   HGBA1C 10.0* 10/19/2011     Your HGBA1c can be lowered with medications, healthy diet, and exercise.  Check your blood sugar as directed by your physician  Call  your physician if you experience unexplained or low blood sugars.  PHYSICAL ACTIVITY/REHABILITATION Goal is 30 minutes at least 4 days per week  Activity: Increase activity slowly, and No driving, Therapies: Physical Therapy: Home Health Return to work:     Activity decreases your risk of heart attack and stroke and makes your heart stronger.  It helps control your weight and blood pressure; helps you relax and can improve your mood.  Participate in a regular exercise program.  Talk with your doctor about the best form of exercise for you (dancing, walking, swimming, cycling).  DIET/WEIGHT Goal is to maintain a healthy weight  Your discharge diet is: Carb Control thin liquids Your height is:  Height: 6\' 1"  (185.4 cm) Your current weight is: Weight: 173.365 kg (382 lb 3.2 oz) Your Body Mass Index (BMI) is:  BMI (Calculated): 50.5   Following the type of diet specifically designed for you will help prevent another stroke.  Your goal weight range is:     Your goal Body Mass Index (BMI) is 19-24.  Healthy food habits can help reduce 3 risk factors for stroke:  High cholesterol, hypertension, and excess weight.  RESOURCES Stroke/Support Group:  Call [phone]   STROKE EDUCATION PROVIDED/REVIEWED AND GIVEN TO PATIENT Stroke warning signs and symptoms How to activate emergency medical system (call 911). Medications prescribed at discharge. Need for follow-up after discharge. Personal risk  factors for stroke. Pneumonia vaccine given: No Flu vaccine given: No My questions have been answered, the writing is legible, and I understand these instructions.  I will adhere to these goals & educational materials that have been provided to me after my discharge from the hospital.

## 2011-10-29 NOTE — Progress Notes (Signed)
Clinical Social Worker received a phone call by RN to address transportation concerns. Pt has been discharged. [name] met with pt to address consult. [name] introduced herself and explained role of social work. [place], therefore is unable to access the bus line. Pt shared that his MD recommended that pt does not drive because he "falls out." Pt does not have family or friends that will be able to provide transportation today. Pt inquired about transportation services. [name] provided info on transportation through [place] and encouraged pt to contact [employer] for information on transportation services for individuals on disability. [name] also encouraged pt to contact his pharmacy to inquire about delivery services. [name] provided pt with an approved cab vouchure. [name] is signing off as no further clinical social work needs identified.   [name], MSW, [place]

## 2011-11-12 ENCOUNTER — Other Ambulatory Visit: Payer: Self-pay

## 2011-11-12 ENCOUNTER — Emergency Department (HOSPITAL_COMMUNITY): Payer: Medicare Other

## 2011-11-12 ENCOUNTER — Encounter (HOSPITAL_COMMUNITY): Payer: Self-pay

## 2011-11-12 ENCOUNTER — Inpatient Hospital Stay (HOSPITAL_COMMUNITY)
Admission: EM | Admit: 2011-11-12 | Discharge: 2011-11-14 | DRG: 303 | Disposition: A | Discharge status: Home Health | Payer: Medicare Other | Attending: Internal Medicine | Admitting: Internal Medicine

## 2011-11-12 DIAGNOSIS — E0800 Diabetes mellitus due to underlying condition with hyperosmolarity without nonketotic hyperglycemic-hyperosmolar coma (NKHHC): Secondary | ICD-10-CM | POA: Diagnosis present

## 2011-11-12 DIAGNOSIS — E1149 Type 2 diabetes mellitus with other diabetic neurological complication: Secondary | ICD-10-CM | POA: Diagnosis present

## 2011-11-12 DIAGNOSIS — I10 Essential (primary) hypertension: Secondary | ICD-10-CM | POA: Diagnosis present

## 2011-11-12 DIAGNOSIS — N3289 Other specified disorders of bladder: Secondary | ICD-10-CM | POA: Diagnosis present

## 2011-11-12 DIAGNOSIS — E669 Obesity, unspecified: Secondary | ICD-10-CM

## 2011-11-12 DIAGNOSIS — R739 Hyperglycemia, unspecified: Secondary | ICD-10-CM

## 2011-11-12 DIAGNOSIS — R0789 Other chest pain: Secondary | ICD-10-CM | POA: Diagnosis present

## 2011-11-12 DIAGNOSIS — G4730 Sleep apnea, unspecified: Secondary | ICD-10-CM | POA: Diagnosis present

## 2011-11-12 DIAGNOSIS — R5381 Other malaise: Secondary | ICD-10-CM

## 2011-11-12 DIAGNOSIS — R0989 Other specified symptoms and signs involving the circulatory and respiratory systems: Secondary | ICD-10-CM | POA: Diagnosis present

## 2011-11-12 DIAGNOSIS — R0609 Other forms of dyspnea: Secondary | ICD-10-CM | POA: Diagnosis present

## 2011-11-12 DIAGNOSIS — I209 Angina pectoris, unspecified: Secondary | ICD-10-CM | POA: Diagnosis present

## 2011-11-12 DIAGNOSIS — E871 Hypo-osmolality and hyponatremia: Secondary | ICD-10-CM | POA: Diagnosis present

## 2011-11-12 DIAGNOSIS — R0602 Shortness of breath: Secondary | ICD-10-CM | POA: Diagnosis present

## 2011-11-12 DIAGNOSIS — Z794 Long term (current) use of insulin: Secondary | ICD-10-CM

## 2011-11-12 DIAGNOSIS — E1142 Type 2 diabetes mellitus with diabetic polyneuropathy: Secondary | ICD-10-CM | POA: Diagnosis present

## 2011-11-12 DIAGNOSIS — R0600 Dyspnea, unspecified: Secondary | ICD-10-CM

## 2011-11-12 DIAGNOSIS — R079 Chest pain, unspecified: Secondary | ICD-10-CM | POA: Diagnosis present

## 2011-11-12 DIAGNOSIS — Z96659 Presence of unspecified artificial knee joint: Secondary | ICD-10-CM

## 2011-11-12 DIAGNOSIS — I2510 Atherosclerotic heart disease of native coronary artery without angina pectoris: Principal | ICD-10-CM | POA: Diagnosis present

## 2011-11-12 LAB — BASIC METABOLIC PANEL
BUN: 14 mg/dL (ref 6–23)
Calcium: 8.8 mg/dL (ref 8.4–10.5)
Creatinine, Ser: 0.56 mg/dL (ref 0.50–1.35)
GFR calc Af Amer: >90 mL/min (ref >90)
GFR calc non Af Amer: >90 mL/min (ref >90)
Glucose, Bld: 379 mg/dL — ABNORMAL HIGH (ref 70–99)
Potassium: 4.1 mEq/L (ref 3.5–5.1)

## 2011-11-12 LAB — URINALYSIS, ROUTINE W REFLEX MICROSCOPIC
Glucose, UA: >1000 mg/dL — AB
Leukocytes, UA: NEGATIVE
Protein, ur: 30 mg/dL — AB
Urobilinogen, UA: 1 mg/dL (ref 0.0–1.0)

## 2011-11-12 LAB — CBC
MCH: 30.4 pg (ref 26.0–34.0)
MCHC: 35.3 g/dL (ref 30.0–36.0)
Platelets: 217 10*3/uL (ref 150–400)
RDW: 13.2 % (ref 11.5–15.5)

## 2011-11-12 LAB — CARDIAC PANEL(CRET KIN+CKTOT+MB+TROPI)
CK, MB: 3 ng/mL (ref 0.3–4.0)
CK, MB: 2.7 ng/mL (ref 0.3–4.0)
Relative Index: INVALID (ref 0.0–2.5)
Total CK: 51 U/L (ref 7–232)
Total CK: 46 U/L (ref 7–232)
Troponin I: <0.3 ng/mL (ref <0.30)
Troponin I: <0.3 ng/mL (ref <0.30)

## 2011-11-12 LAB — DIFFERENTIAL
Basophils Absolute: 0.1 10*3/uL (ref 0.0–0.1)
Basophils Relative: 1 % (ref 0–1)
Eosinophils Absolute: 0.2 10*3/uL (ref 0.0–0.7)
Monocytes Relative: 7 % (ref 3–12)
Neutro Abs: 7.8 10*3/uL — ABNORMAL HIGH (ref 1.7–7.7)
Neutrophils Relative %: 76 % (ref 43–77)

## 2011-11-12 LAB — GLUCOSE, CAPILLARY: Glucose-Capillary: 352 mg/dL — ABNORMAL HIGH (ref 70–99)

## 2011-11-12 LAB — TSH: TSH: 2.194 u[IU]/mL (ref 0.350–4.500)

## 2011-11-12 LAB — URINE MICROSCOPIC-ADD ON

## 2011-11-12 LAB — LACTIC ACID, PLASMA: Lactic Acid, Venous: 2.1 mmol/L (ref 0.5–2.2)

## 2011-11-12 LAB — POCT I-STAT TROPONIN I

## 2011-11-12 MED ORDER — NITROGLYCERIN 0.4 MG SL SUBL: 0.4000 mg | SUBLINGUAL_TABLET | SUBLINGUAL | Status: DC | PRN

## 2011-11-12 MED ORDER — HYDROMORPHONE HCL PF 1 MG/ML IJ SOLN
1.0000 mg | INTRAMUSCULAR | Status: DC | PRN
  Administered 2011-11-12 – 2011-11-13 (×2): 1 mg via INTRAVENOUS
  Filled 2011-11-12 (×2): qty 1

## 2011-11-12 MED ORDER — OXYBUTYNIN CHLORIDE 5 MG PO TABS
5.0000 mg | ORAL_TABLET | Freq: Three times a day (TID) | ORAL | Status: DC
  Administered 2011-11-12 – 2011-11-14 (×6): 5 mg via ORAL
  Filled 2011-11-12 (×11): qty 1

## 2011-11-12 MED ORDER — ACETAMINOPHEN 325 MG PO TABS: 650.0000 mg | ORAL_TABLET | Freq: Four times a day (QID) | ORAL | Status: DC | PRN

## 2011-11-12 MED ORDER — ASPIRIN EC 81 MG PO TBEC
81.0000 mg | DELAYED_RELEASE_TABLET | Freq: Every day | ORAL | Status: DC
  Filled 2011-11-12: qty 1

## 2011-11-12 MED ORDER — ONDANSETRON HCL 4 MG/2ML IJ SOLN
4.0000 mg | Freq: Four times a day (QID) | INTRAMUSCULAR | Status: DC | PRN
  Administered 2011-11-13: 4 mg via INTRAVENOUS
  Filled 2011-11-12: qty 2

## 2011-11-12 MED ORDER — ACETAMINOPHEN 650 MG RE SUPP: 650.0000 mg | Freq: Four times a day (QID) | RECTAL | Status: DC | PRN

## 2011-11-12 MED ORDER — ASPIRIN 81 MG PO TABS: 81.0000 mg | ORAL_TABLET | Freq: Every day | ORAL | Status: DC

## 2011-11-12 MED ORDER — ONDANSETRON HCL 4 MG PO TABS: 4.0000 mg | ORAL_TABLET | Freq: Four times a day (QID) | ORAL | Status: DC | PRN

## 2011-11-12 MED ORDER — PANTOPRAZOLE SODIUM 40 MG PO TBEC
40.0000 mg | DELAYED_RELEASE_TABLET | Freq: Two times a day (BID) | ORAL | Status: DC
  Administered 2011-11-12 – 2011-11-14 (×4): 40 mg via ORAL
  Filled 2011-11-12 (×8): qty 1

## 2011-11-12 MED ORDER — IOHEXOL 350 MG/ML SOLN
100.0000 mL | Freq: Once | INTRAVENOUS | Status: AC | PRN
  Administered 2011-11-12: 100 mL via INTRAVENOUS

## 2011-11-12 MED ORDER — INSULIN ASPART 100 UNIT/ML ~~LOC~~ SOLN
0.0000 [IU] | Freq: Three times a day (TID) | SUBCUTANEOUS | Status: DC
  Administered 2011-11-12: 0 [IU] via SUBCUTANEOUS
  Filled 2011-11-12: qty 3

## 2011-11-12 MED ORDER — SODIUM CHLORIDE 0.9 % IV SOLN
INTRAVENOUS | Status: AC
  Administered 2011-11-12: 75 mL/h via INTRAVENOUS

## 2011-11-12 MED ORDER — ASPIRIN EC 81 MG PO TBEC
81.0000 mg | DELAYED_RELEASE_TABLET | Freq: Every day | ORAL | Status: DC
  Administered 2011-11-12 – 2011-11-14 (×3): 81 mg via ORAL
  Filled 2011-11-12 (×4): qty 1

## 2011-11-12 MED ORDER — DIPHENOXYLATE-ATROPINE 2.5-0.025 MG PO TABS
2.0000 | ORAL_TABLET | Freq: Four times a day (QID) | ORAL | Status: DC | PRN
  Administered 2011-11-13 (×2): 2 via ORAL
  Filled 2011-11-12 (×2): qty 2

## 2011-11-12 MED ORDER — METOPROLOL TARTRATE 25 MG PO TABS
25.0000 mg | ORAL_TABLET | Freq: Two times a day (BID) | ORAL | Status: DC
  Administered 2011-11-12 – 2011-11-13 (×3): 25 mg via ORAL
  Filled 2011-11-12 (×5): qty 1

## 2011-11-12 MED ORDER — SODIUM CHLORIDE 0.9 % IJ SOLN
3.0000 mL | Freq: Two times a day (BID) | INTRAMUSCULAR | Status: DC
  Administered 2011-11-12 – 2011-11-14 (×4): 3 mL via INTRAVENOUS

## 2011-11-12 MED ORDER — INSULIN ASPART PROT & ASPART (70-30 MIX) 100 UNIT/ML ~~LOC~~ SUSP
55.0000 [IU] | Freq: Two times a day (BID) | SUBCUTANEOUS | Status: DC
  Administered 2011-11-12: 55 [IU] via SUBCUTANEOUS
  Filled 2011-11-12: qty 3

## 2011-11-12 MED ORDER — ENOXAPARIN SODIUM 60 MG/0.6ML ~~LOC~~ SOLN
60.0000 mg | SUBCUTANEOUS | Status: DC
  Filled 2011-11-12: qty 0.6

## 2011-11-12 MED ORDER — RAMIPRIL 10 MG PO CAPS
10.0000 mg | ORAL_CAPSULE | Freq: Every day | ORAL | Status: DC
  Administered 2011-11-13 – 2011-11-14 (×2): 10 mg via ORAL
  Filled 2011-11-12 (×3): qty 1

## 2011-11-12 MED ORDER — CLOPIDOGREL BISULFATE 75 MG PO TABS
75.0000 mg | ORAL_TABLET | Freq: Every day | ORAL | Status: DC
  Administered 2011-11-13 – 2011-11-14 (×2): 75 mg via ORAL
  Filled 2011-11-12 (×3): qty 1

## 2011-11-12 NOTE — H&P (Signed)
PCP:   Irven Shelling, MD, MD   Chief Complaint:  SOB, chest pain.  HPI: 66 year old male with a past medical history significant for hypertension, diabetes, obesity, coronary artery disease, hyperlipidemia and chronic indwelling Foley catheter; came to the hospital from his primary doctor's office secondary to dyspnea with minimal exertion and chest discomfort. Patient was recently admitted to the hospital secondary to left subcortical CVA and chest discomfort (he had a cath and was found with CAD RCA >90% occlusion). In the ED he denies SOB and was found with good O2 sat; still reports son intermittent chest discomfort but overall better. Whole initial work up negative in the ED ruling out PNA, PE, new ischemic changes on EKG; negative cardiac enzymes or any signs of infection. [facility] called to admit for further evaluation and treatment.  Allergies:   Allergies  Allergen Reactions  . Cefadroxil Hives  . Cephalexin Hives  . Lipitor (Atorvastatin Calcium) Swelling  . Metformin And Related Swelling  . Morphine And Related Other (See Comments)    Sweating, feels like is "in rocky boat."  . Robaxin Other (See Comments)    Feels like is in "rocky boat"      Past Medical History  Diagnosis Date  . Obese   . Hypertension   . Diabetes mellitus   . Diabetic diarrhea   . Sleep apnea   . Diabetic neuropathy, painful   . Chronic indwelling foley catheter   . Bladder spasms   . Complication of anesthesia   . PONV (postoperative nausea and vomiting)     Past Surgical History  Procedure Date  . Joint replacement     Left Total Knee  . Gastroplasty   . Cholecystectomy     Prior to Admission medications   Medication Sig Start Date End Date Taking? Authorizing Provider  aspirin 81 MG tablet Take 1 tablet (81 mg total) by mouth daily. 10/29/11 10/28/12 Yes Irven Shelling, MD  clopidogrel (PLAVIX) 75 MG tablet Take 1 tablet (75 mg total) by mouth daily with breakfast. 10/29/11  10/28/12 Yes Irven Shelling, MD  diphenhydrAMINE (BENADRYL) 25 mg capsule Take 25 mg by mouth every 6 (six) hours as needed. For allergy.   Yes Historical Provider, MD  diphenoxylate-atropine (LOMOTIL) 2.5-0.025 MG per tablet Take 2 tablets by mouth 4 (four) times daily as needed. Diarrhea   Yes Historical Provider, MD  insulin NPH-insulin regular (NOVOLIN 70/30) (70-30) 100 UNIT/ML injection Inject 55 Units into the skin 2 (two) times daily with a meal.   Yes Historical Provider, MD  Melatonin 3 MG CAPS Take 3 mg by mouth at bedtime as needed. Insomnia   Yes Historical Provider, MD  metoprolol tartrate (LOPRESSOR) 12.5 mg TABS Take 1 tablet (25 mg total) by mouth 2 (two) times daily. 10/29/11  Yes Irven Shelling, MD  nitroGLYCERIN (NITROSTAT) 0.4 MG SL tablet Place 1 tablet (0.4 mg total) under the tongue every 5 (five) minutes x 3 doses as needed for chest pain. 10/29/11 10/28/12 Yes Irven Shelling, MD  omeprazole (PRILOSEC) 20 MG capsule Take 20 mg by mouth daily.   Yes Historical Provider, MD  oxybutynin (DITROPAN) 5 MG tablet Take 5 mg by mouth 3 (three) times daily.   Yes Historical Provider, MD  ramipril (ALTACE) 10 MG capsule Take 10 mg by mouth daily.   Yes Historical Provider, MD    Social History:  reports that he has quit smoking. He has never used smokeless tobacco. He reports that he does not  drink alcohol or use illicit drugs.  History reviewed. No pertinent family history.  Review of Systems:  Positive for chest pain; increased belching and SOB; otherwise negative except as mentioned on HPI.  Physical Exam: Blood pressure 155/79, pulse 84, temperature 97.6 F (36.4 C), temperature source Oral, resp. rate 18, height 6\' 1"  (1.854 m), weight 174.181 kg (384 lb), SpO2 95.00%. GERD: He is oriented to person, place, and time. He appears comfortable and in NAD; Obese and with mild dry mucous membranes.  HENT:  Head: Normocephalic and atraumatic.  Mouth/Throat: Oropharynx  is clear; mild dry MM. No erythema or exudate.  Eyes: Conjunctivae are normal. Pupils are equal, round, and reactive to light. No scleral icterus.  Neck: Normal range of motion. Neck supple.  Cardiovascular: Normal rate, regular rhythm and normal heart sounds.  Respiratory: Effort normal and breath sounds normal. No respiratory distress. He has no wheezes. He has no rales.  GI: Soft. Bowel sounds are normal. He exhibits no distension. There is no tenderness. There is no rebound.  Musculoskeletal: Normal range of motion. He exhibits no edema and no tenderness. R foot second toe with chronic wound; also with wound on his L foot plantar aspect. Neurological: He is alert and oriented to person, place, and time. MS 5/5 bilaterally; CN grossly intact. Described right arm numbness (no new).   Labs on Admission:  Results for orders placed during the hospital encounter of 11/12/11 (from the past 48 hour(s))  CARDIAC PANEL(CRET KIN+CKTOT+MB+TROPI)     Status: Normal   Collection Time   11/12/11 10:05 AM      Component Value Range Comment   Total CK 51  7 - 232 (U/L)    CK, MB 3.0  0.3 - 4.0 (ng/mL)    Troponin I <0.30  <0.30 (ng/mL)    Relative Index RELATIVE INDEX IS INVALID  0.0 - 2.5    BASIC METABOLIC PANEL     Status: Abnormal   Collection Time   11/12/11 10:05 AM      Component Value Range Comment   Sodium 128 (*) 135 - 145 (mEq/L)    Potassium 4.1  3.5 - 5.1 (mEq/L)    Chloride 92 (*) 96 - 112 (mEq/L)    CO2 24  19 - 32 (mEq/L)    Glucose, Bld 379 (*) 70 - 99 (mg/dL)    BUN 14  6 - 23 (mg/dL)    Creatinine, Ser 0.56  0.50 - 1.35 (mg/dL)    Calcium 8.8  8.4 - 10.5 (mg/dL)    GFR calc non Af Amer >90  >90 (mL/min)    GFR calc Af Amer >90  >90 (mL/min)   CBC     Status: Normal   Collection Time   11/12/11 10:05 AM      Component Value Range Comment   WBC 10.3  4.0 - 10.5 (K/uL)    RBC 4.83  4.22 - 5.81 (MIL/uL)    Hemoglobin 14.7  13.0 - 17.0 (g/dL)    HCT 41.7  39.0 - 52.0 (%)    MCV  86.3  78.0 - 100.0 (fL)    MCH 30.4  26.0 - 34.0 (pg)    MCHC 35.3  30.0 - 36.0 (g/dL)    RDW 13.2  11.5 - 15.5 (%)    Platelets 217  150 - 400 (K/uL)   DIFFERENTIAL     Status: Abnormal   Collection Time   11/12/11 10:05 AM      Component Value Range Comment  Neutrophils Relative 76  43 - 77 (%)    Neutro Abs 7.8 (*) 1.7 - 7.7 (K/uL)    Lymphocytes Relative 15  12 - 46 (%)    Lymphs Abs 1.6  0.7 - 4.0 (K/uL)    Monocytes Relative 7  3 - 12 (%)    Monocytes Absolute 0.7  0.1 - 1.0 (K/uL)    Eosinophils Relative 2  0 - 5 (%)    Eosinophils Absolute 0.2  0.0 - 0.7 (K/uL)    Basophils Relative 1  0 - 1 (%)    Basophils Absolute 0.1  0.0 - 0.1 (K/uL)   LACTIC ACID, PLASMA     Status: Normal   Collection Time   11/12/11 10:05 AM      Component Value Range Comment   Lactic Acid, Venous 2.1  0.5 - 2.2 (mmol/L)   PRO B NATRIURETIC PEPTIDE     Status: Abnormal   Collection Time   11/12/11 10:07 AM      Component Value Range Comment   Pro B Natriuretic peptide (BNP) 675.3 (*) 0 - 125 (pg/mL)   URINALYSIS, ROUTINE W REFLEX MICROSCOPIC     Status: Abnormal   Collection Time   11/12/11 11:07 AM      Component Value Range Comment   Color, Urine YELLOW  YELLOW     APPearance CLEAR  CLEAR     Specific Gravity, Urine 1.041 (*) 1.005 - 1.030     pH 6.5  5.0 - 8.0     Glucose, UA >1000 (*) NEGATIVE (mg/dL)    Hgb urine dipstick TRACE (*) NEGATIVE     Bilirubin Urine NEGATIVE  NEGATIVE     Ketones, ur TRACE (*) NEGATIVE (mg/dL)    Protein, ur 30 (*) NEGATIVE (mg/dL)    Urobilinogen, UA 1.0  0.0 - 1.0 (mg/dL)    Nitrite NEGATIVE  NEGATIVE     Leukocytes, UA NEGATIVE  NEGATIVE    URINE MICROSCOPIC-ADD ON     Status: Abnormal   Collection Time   11/12/11 11:07 AM      Component Value Range Comment   Squamous Epithelial / LPF FEW (*) RARE     WBC, UA 11-20  <3 (WBC/hpf)    RBC / HPF 0-2  <3 (RBC/hpf)    Bacteria, UA FEW (*) RARE     Urine-Other MUCOUS PRESENT     POCT I-STAT TROPONIN I      Status: Normal   Collection Time   11/12/11  1:44 PM      Component Value Range Comment   Troponin i, poc 0.00  0.00 - 0.08 (ng/mL)    Comment 3            CARDIAC PANEL(CRET KIN+CKTOT+MB+TROPI)     Status: Normal   Collection Time   11/12/11  5:20 PM      Component Value Range Comment   Total CK 46  7 - 232 (U/L)    CK, MB 2.7  0.3 - 4.0 (ng/mL)    Troponin I <0.30  <0.30 (ng/mL)    Relative Index RELATIVE INDEX IS INVALID  0.0 - 2.5    GLUCOSE, CAPILLARY     Status: Abnormal   Collection Time   11/12/11  5:40 PM      Component Value Range Comment   Glucose-Capillary 352 (*) 70 - 99 (mg/dL)     Radiological Exams on Admission: Dg Chest 2 View  11/12/2011  *RADIOLOGY REPORT*  Clinical Data: Cough, congestion, short  of breath and chest tightness  CHEST - 2 VIEW  Comparison: Chest x-ray of 10/18/2011  Findings: No active infiltrate or effusion is seen.  There is minimal fullness of the perihilar vasculature and mild pulmonary vascular congestion cannot be excluded.  There is moderate cardiomegaly present.  No acute bony abnormality is seen.  Multiple surgical clips are noted in the left and right upper quadrants. There are degenerative changes throughout the thoracic spine.  IMPRESSION:  1.  Moderate cardiomegaly. 2.  Cannot exclude mild pulmonary vascular congestion.  Original Report Authenticated By: Joretta Bachelor, M.D.   Ct Angio Chest W/cm &/or Wo Cm  11/12/2011  *RADIOLOGY REPORT*  Clinical Data: Shortness of breath.  CT ANGIOGRAPHY CHEST  Technique:  Multidetector CT imaging of the chest using the standard protocol during bolus administration of intravenous contrast. Multiplanar reconstructed images including MIPs were obtained and reviewed to evaluate the vascular anatomy.  Contrast: 119mL OMNIPAQUE IOHEXOL 350 MG/ML IV SOLN  Comparison: 04/12/2010  Findings: No filling defects in the pulmonary arteries to suggest pulmonary emboli.  Coronary artery calcifications within the LAD, left  circumflex and right coronary.  Heart is borderline in size. Aorta is normal caliber.  Small scattered mediastinal lymph nodes, none pathologically enlarged.  No hilar or axillary adenopathy.  No confluent opacities in the lungs.  No effusions.  Imaging into the upper abdomen shows no acute findings. Postoperative changes in the left upper quadrant.  IMPRESSION: No evidence of pulmonary embolus.  Coronary artery disease.  Borderline heart size.  Original Report Authenticated By: Raelyn Number, M.D.     Assessment/Plan 1-Chest pain: Patient reports chest pain with minimal exertion, with association of breath. In the ED chest x-ray do not demonstrate any pneumonia, a CTA negative for pulmonary embolism; patient no febrile and WBCs WNL. He had a history of recent admission secondary to chest pain where he had a cath and diagnosed with was found to have a right coronary artery disease with a diffuse long segment up to 90% lesion at the bifurcation of a small PDA and the decision for medical management was taken. Will admit to telemetry; cycle cardiac enzymes and increased PPI to BID.  2-CAD: continue plavix, ASA, B-blockers and ACE inhibitors.  3-DM (diabetes mellitus) with complications: will check A1C and use SSI and 70/30.  4-SOB (shortness of breath): most likely associated with CAD; further medication titration per PCP and cardiology as needed. On my exam he was no complaining of SOB.  5-Hyponatremia and hypochloremia: he reports not been eating and drinking properly for the last 2 days. Specific gravity in his UA also suggest dehydration. Will provide gentle hydration. Will follow BMET in am.  6-CVA: continue ASA and plavix; no new focal deficit.  DVT: lovenox.  Rest of medical problems stable; continue home regimen.   Time Spent on Admission: 50 minutes  [place] [phone]  11/12/2011, 6:12 PM

## 2011-11-12 NOTE — ED Provider Notes (Addendum)
History     CSN: [id number]  Arrival date & time 11/12/11  [id number]   First MD Initiated Contact with Patient 11/12/11 [id number]      Chief Complaint  Patient presents with  . Shortness of Breath   patient was transferred from his primary doctor's office today by ambulance for exertional dyspnea. Recently was admitted to the hospital for stroke. Patient states he is chronically short of breath, and admits to being less active and "sedentary" he was at his doctor's today for followup and states he was short of breath. Just getting up onto the examining table. He has had some generalized weakness. Dry mouth. He has reported dark urine. The patient has had some continued right arm numbness. Likely felt secondary to previous stroke. Sent in by his primary doctor for increasing dyspnea. Also known has a history of diabetes. Patient does also have some pain with deep inspiration and has chronic chest "tightness. However, he denies any pain. He's had no fever, but a mild cough. No leg swelling. Some nausea, but no vomiting. No back pain or syncope. Patient apparently lives alone, without any significant social support as documented in the note from his primary care provider  (Consider location/radiation/quality/duration/timing/severity/associated sxs/prior treatment) HPI  Past Medical History  Diagnosis Date  . Obese   . Hypertension   . Diabetes mellitus   . Diabetic diarrhea   . Sleep apnea   . Diabetic neuropathy, painful   . Chronic indwelling foley catheter   . Bladder spasms   . Complication of anesthesia   . PONV (postoperative nausea and vomiting)     Past Surgical History  Procedure Date  . Joint replacement     Left Total Knee  . Gastroplasty   . Cholecystectomy     History reviewed. No pertinent family history.  History  Substance Use Topics  . Smoking status: Former Smoker -- 2.0 packs/day  . Smokeless tobacco: Never Used  . Alcohol Use: No      Review of Systems  All  other systems reviewed and are negative.    Allergies  Cefadroxil; Cephalexin; Lipitor; Metformin and related; Morphine and related; and Robaxin  Home Medications   Current Outpatient Rx  Name Route Sig Dispense Refill  . ASPIRIN 81 MG PO TABS Oral Take 1 tablet (81 mg total) by mouth daily. 30 tablet 11  . CLOPIDOGREL BISULFATE 75 MG PO TABS Oral Take 1 tablet (75 mg total) by mouth daily with breakfast. 30 tablet 11  . DIPHENHYDRAMINE HCL 25 MG PO CAPS Oral Take 25 mg by mouth every 6 (six) hours as needed. For allergy.    Marland Kitchen DIPHENOXYLATE-ATROPINE 2.5-0.025 MG PO TABS Oral Take 2 tablets by mouth 4 (four) times daily as needed. Diarrhea    . INSULIN ISOPHANE & REGULAR (70-30) 100 UNIT/ML [place] SUSP Subcutaneous Inject 55 Units into the skin 2 (two) times daily with a meal.    . MELATONIN 3 MG PO CAPS Oral Take 3 mg by mouth at bedtime as needed. Insomnia    . METOPROLOL TARTRATE 12.5 MG HALF TABLET Oral Take 1 tablet (25 mg total) by mouth 2 (two) times daily. 60 tablet 11  . NITROGLYCERIN 0.4 MG SL SUBL Sublingual Place 1 tablet (0.4 mg total) under the tongue every 5 (five) minutes x 3 doses as needed for chest pain. 1 tablet 1  . OMEPRAZOLE 20 MG PO CPDR Oral Take 20 mg by mouth daily.    . OXYBUTYNIN CHLORIDE 5 MG PO TABS  Oral Take 5 mg by mouth 3 (three) times daily.    Marland Kitchen RAMIPRIL 10 MG PO CAPS Oral Take 10 mg by mouth daily.      BP 140/77  Pulse 78  Temp(Src) 97.9 F (36.6 C) (Oral)  Resp 18  Ht 6\' 1"  (1.854 m)  Wt 284 lb (128.822 kg)  BMI 37.47 kg/m2  SpO2 99%  Physical Exam  Nursing note and vitals reviewed. Constitutional: He is oriented to person, place, and time. He appears well-developed and well-nourished.       Patient is obese, appears to be chronically debilitated, no acute distress.  HENT:  Head: Normocephalic and atraumatic.  Eyes: Conjunctivae and EOM are normal. Pupils are equal, round, and reactive to light.  Neck: Neck supple.  Cardiovascular: Normal  rate and regular rhythm.  Exam reveals no gallop and no friction rub.   No murmur heard. Pulmonary/Chest: Breath sounds normal. He has no wheezes. He has no rales. He exhibits no tenderness.  Abdominal: Soft. Bowel sounds are normal. He exhibits no distension. There is no tenderness. There is no rebound and no guarding.  Musculoskeletal: Normal range of motion.       Some chronic-appearing skin changes, and venous stasis. No calf tenderness. No frank cellulitis.  Neurological: He is alert and oriented to person, place, and time. No cranial nerve deficit. Coordination normal.  Skin: Skin is warm and dry. No rash noted.  Psychiatric: He has a normal mood and affect.    ED Course  Procedures (including critical care time)  Labs Reviewed  BASIC METABOLIC PANEL - Abnormal; Notable for the following:    Sodium 128 (*)    Chloride 92 (*)    Glucose, Bld 379 (*)    All other components within normal limits  DIFFERENTIAL - Abnormal; Notable for the following:    Neutro Abs 7.8 (*)    All other components within normal limits  PRO B NATRIURETIC PEPTIDE - Abnormal; Notable for the following:    Pro B Natriuretic peptide (BNP) 675.3 (*)    All other components within normal limits  URINALYSIS, ROUTINE W REFLEX MICROSCOPIC - Abnormal; Notable for the following:    Specific Gravity, Urine 1.041 (*)    Glucose, UA >1000 (*)    Hgb urine dipstick TRACE (*)    Ketones, ur TRACE (*)    Protein, ur 30 (*)    All other components within normal limits  URINE MICROSCOPIC-ADD ON - Abnormal; Notable for the following:    Squamous Epithelial / LPF FEW (*)    Bacteria, UA FEW (*)    All other components within normal limits  CARDIAC PANEL(CRET KIN+CKTOT+MB+TROPI)  CBC  LACTIC ACID, PLASMA  URINALYSIS, ROUTINE W REFLEX MICROSCOPIC  URINE CULTURE  CULTURE, BLOOD (ROUTINE X 2)  CULTURE, BLOOD (ROUTINE X 2)  URINE CULTURE   Dg Chest 2 View  11/12/2011  *RADIOLOGY REPORT*  Clinical Data: Cough,  congestion, short of breath and chest tightness  CHEST - 2 VIEW  Comparison: Chest x-ray of 10/18/2011  Findings: No active infiltrate or effusion is seen.  There is minimal fullness of the perihilar vasculature and mild pulmonary vascular congestion cannot be excluded.  There is moderate cardiomegaly present.  No acute bony abnormality is seen.  Multiple surgical clips are noted in the left and right upper quadrants. There are degenerative changes throughout the thoracic spine.  IMPRESSION:  1.  Moderate cardiomegaly. 2.  Cannot exclude mild pulmonary vascular congestion.  Original Report Authenticated By:  Joretta Bachelor, M.D.   Ct Angio Chest W/cm &/or Wo Cm  11/12/2011  *RADIOLOGY REPORT*  Clinical Data: Shortness of breath.  CT ANGIOGRAPHY CHEST  Technique:  Multidetector CT imaging of the chest using the standard protocol during bolus administration of intravenous contrast. Multiplanar reconstructed images including MIPs were obtained and reviewed to evaluate the vascular anatomy.  Contrast: 112mL OMNIPAQUE IOHEXOL 350 MG/ML IV SOLN  Comparison: 04/12/2010  Findings: No filling defects in the pulmonary arteries to suggest pulmonary emboli.  Coronary artery calcifications within the LAD, left circumflex and right coronary.  Heart is borderline in size. Aorta is normal caliber.  Small scattered mediastinal lymph nodes, none pathologically enlarged.  No hilar or axillary adenopathy.  No confluent opacities in the lungs.  No effusions.  Imaging into the upper abdomen shows no acute findings. Postoperative changes in the left upper quadrant.  IMPRESSION: No evidence of pulmonary embolus.  Coronary artery disease.  Borderline heart size.  Original Report Authenticated By: Raelyn Number, M.D.     No diagnosis found.    MDM  Pt is seen and examined;  Initial history and physical completed.  Will follow.     Will need complete workup including CT angiogram of the chest. Cardiac markers. Workup for sepsis.  Chest x-ray, urine basic labs blood cultures, et Ronney Asters. Will follow closely.    Date: 11/12/2011  Rate: 86  Rhythm: normal sinus rhythm  QRS Axis: left  Intervals: normal  ST/T Wave abnormalities: nonspecific ST/T changes  Conduction Disutrbances:nonspecific intraventricular conduction delay  Narrative Interpretation:   Old EKG Reviewed: changes noted     Date: 11/12/2011  Rate: 82  Rhythm: normal sinus rhythm  QRS Axis: left  Intervals: normal  ST/T Wave abnormalities: nonspecific ST/T changes  Conduction Disutrbances:left anterior fascicular block  Narrative Interpretation:   Old EKG Reviewed: changes noted  Initial troponin was negative. Urine was negative for nitrite or leukocyte, but did show a few bacteria. Mild hyponatremia and hyperglycemia noted with a normal CO2, no evidence of DKA. Lactic acid was normal. White count normal. CT angiogram showed no evidence of PE with CAD noted. Plan is to have the patient admitted to the hospitalist for ongoing chest pain. Repeat ECG showed no acute ischemic changes.    Mckena Chern A. Lauris Poag, MD 11/12/11 1316   1:34 PM  Discussed with [facility], requests [facility] consulted.   2:51 PM  Discussed, again with the [facility] hospitalist. They state that they're waiting to hear back from the cardiologist. I expressed to [facility] that I feel this patient clearly warrants at least overnight admission for observation. This based on his symptoms and his ongoing chest pain, and multiple risk factors.   [facility] states that his cardiologist does not want to admit the patient. They will come down and perform a full duration in place. The formal evaluation. The chart. I requested inflammation. For this patient.    Cincere Zorn A. Lauris Poag, MD 11/12/11 1452

## 2011-11-12 NOTE — ED Notes (Signed)
White discharge noticed with removal of old catheter

## 2011-11-12 NOTE — ED Notes (Signed)
[id number] Expected date:<BR> Expected time:<BR> Means of arrival:<BR> Comments:<BR> Ems/ sob coming from doc office

## 2011-11-12 NOTE — ED Notes (Signed)
Pt brought by ems from PCP office, he was there for F/U after hospitalization a week ago. Pt states he is always short of breath but when he was getting on the exam table he had increase in sob that prompted his pcp to call ems.

## 2011-11-13 LAB — LIPID PANEL
Cholesterol: 190 mg/dL (ref 0–200)
LDL Cholesterol: 123 mg/dL — ABNORMAL HIGH (ref 0–99)
Total CHOL/HDL Ratio: 5.6 RATIO
Triglycerides: 165 mg/dL — ABNORMAL HIGH (ref <150)
VLDL: 33 mg/dL (ref 0–40)

## 2011-11-13 LAB — BASIC METABOLIC PANEL
Calcium: 8.8 mg/dL (ref 8.4–10.5)
GFR calc non Af Amer: >90 mL/min (ref >90)
Potassium: 3.8 mEq/L (ref 3.5–5.1)
Sodium: 133 mEq/L — ABNORMAL LOW (ref 135–145)

## 2011-11-13 LAB — CBC
MCH: 29.7 pg (ref 26.0–34.0)
MCHC: 34.4 g/dL (ref 30.0–36.0)
Platelets: 272 10*3/uL (ref 150–400)
RBC: 5.11 MIL/uL (ref 4.22–5.81)

## 2011-11-13 LAB — GLUCOSE, CAPILLARY
Glucose-Capillary: 240 mg/dL — ABNORMAL HIGH (ref 70–99)
Glucose-Capillary: 350 mg/dL — ABNORMAL HIGH (ref 70–99)
Glucose-Capillary: 238 mg/dL — ABNORMAL HIGH (ref 70–99)
Glucose-Capillary: 223 mg/dL — ABNORMAL HIGH (ref 70–99)

## 2011-11-13 LAB — CARDIAC PANEL(CRET KIN+CKTOT+MB+TROPI)
CK, MB: 2.2 ng/mL (ref 0.3–4.0)
CK, MB: 2.5 ng/mL (ref 0.3–4.0)
Total CK: 45 U/L (ref 7–232)
Total CK: 46 U/L (ref 7–232)

## 2011-11-13 MED ORDER — ENOXAPARIN SODIUM 40 MG/0.4ML ~~LOC~~ SOLN
40.0000 mg | SUBCUTANEOUS | Status: AC
  Administered 2011-11-13: 40 mg via SUBCUTANEOUS
  Filled 2011-11-13: qty 0.4

## 2011-11-13 MED ORDER — INSULIN ASPART 100 UNIT/ML ~~LOC~~ SOLN
0.0000 [IU] | Freq: Every day | SUBCUTANEOUS | Status: DC
  Administered 2011-11-13: 2 [IU] via SUBCUTANEOUS

## 2011-11-13 MED ORDER — ALUM & MAG HYDROXIDE-SIMETH 200-200-20 MG/5ML PO SUSP: 30.0000 mL | ORAL | Status: DC | PRN

## 2011-11-13 MED ORDER — INSULIN GLARGINE 100 UNIT/ML ~~LOC~~ SOLN
60.0000 [IU] | Freq: Every day | SUBCUTANEOUS | Status: DC
  Administered 2011-11-13: 60 [IU] via SUBCUTANEOUS
  Filled 2011-11-13: qty 3

## 2011-11-13 MED ORDER — GI COCKTAIL ~~LOC~~
30.0000 mL | Freq: Once | ORAL | Status: AC
  Administered 2011-11-13: 30 mL via ORAL
  Filled 2011-11-13: qty 30

## 2011-11-13 MED ORDER — ENOXAPARIN SODIUM 60 MG/0.6ML ~~LOC~~ SOLN: 60.0000 mg | SUBCUTANEOUS | Status: DC

## 2011-11-13 MED ORDER — ENOXAPARIN SODIUM 40 MG/0.4ML ~~LOC~~ SOLN
40.0000 mg | SUBCUTANEOUS | Status: DC
  Administered 2011-11-14: 40 mg via SUBCUTANEOUS
  Filled 2011-11-13 (×2): qty 0.4

## 2011-11-13 MED ORDER — ENOXAPARIN SODIUM 60 MG/0.6ML ~~LOC~~ SOLN
60.0000 mg | SUBCUTANEOUS | Status: DC
  Filled 2011-11-13: qty 0.6

## 2011-11-13 MED ORDER — ISOSORBIDE MONONITRATE ER 30 MG PO TB24
30.0000 mg | ORAL_TABLET | Freq: Every day | ORAL | Status: DC
  Administered 2011-11-13 – 2011-11-14 (×2): 30 mg via ORAL
  Filled 2011-11-13 (×4): qty 1

## 2011-11-13 MED ORDER — INSULIN GLARGINE 100 UNIT/ML ~~LOC~~ SOLN: 60.0000 [IU] | Freq: Every day | SUBCUTANEOUS | Status: DC

## 2011-11-13 MED ORDER — INSULIN ASPART 100 UNIT/ML ~~LOC~~ SOLN
0.0000 [IU] | Freq: Three times a day (TID) | SUBCUTANEOUS | Status: DC
  Administered 2011-11-13: 15 [IU] via SUBCUTANEOUS
  Administered 2011-11-13: 7 [IU] via SUBCUTANEOUS
  Administered 2011-11-14: 11 [IU] via SUBCUTANEOUS
  Administered 2011-11-14: 15 [IU] via SUBCUTANEOUS

## 2011-11-13 MED ORDER — PROMETHAZINE HCL 25 MG PO TABS
25.0000 mg | ORAL_TABLET | Freq: Once | ORAL | Status: AC
  Administered 2011-11-13: 25 mg via ORAL
  Filled 2011-11-13: qty 1

## 2011-11-13 MED ORDER — INSULIN ASPART 100 UNIT/ML ~~LOC~~ SOLN
8.0000 [IU] | Freq: Three times a day (TID) | SUBCUTANEOUS | Status: DC
  Administered 2011-11-13 – 2011-11-14 (×4): 8 [IU] via SUBCUTANEOUS

## 2011-11-13 NOTE — Progress Notes (Signed)
Physical Therapy Treatment Patient Details Name: [name] MRN: [id number] DOB: July 27, 1946 Today's Date: 11/13/2011  SOB/chest pain 11:10 - 11:30 1 te  PT Assessment/Plan  PT - Assessment/Plan Comments on Treatment Session: Pt given handout on TKR HEP as he requested so that he could exercise his L LE.  Pt c/o increase weakness and he plans to have L TKR soon.  R TKR 2003. PT Plan: Discharge plan remains appropriate PT Frequency: Min 3X/week Follow Up Recommendations: No PT follow up (recommend HHPT but pt reports he can't afford it) Equipment Recommended: None recommended by PT PT Goals  Acute Rehab PT Goals PT Goal Formulation: With patient Time For Goal Achievement: 7 days Pt will go Supine/Side to Sit: with modified independence;with HOB 0 degrees PT Goal: Supine/Side to Sit - Progress: Goal set today Pt will go Sit to Stand: with modified independence PT Goal: Sit to Stand - Progress: Goal set today Pt will go Stand to Sit: with modified independence PT Goal: Stand to Sit - Progress: Goal set today Pt will Ambulate: 16 - 50 feet;with modified independence;with least restrictive assistive device PT Goal: Ambulate - Progress: Goal set today Pt will Perform Home Exercise Program: Independently PT Goal: Perform Home Exercise Program - Progress: Progressing toward goal  PT Treatment Precautions/Restrictions    Mobility (including Balance) Bed Mobility Bed Mobility: Yes (Assisted pt back to bed) Supine to Sit: 5: Supervision;HOB elevated (Comment degrees) Supine to Sit Details (indicate cue type and reason): pt adjusted bed to assist with transfer Sit to Supine: 5: Supervision Sit to Supine - Details (indicate cue type and reason): increased time Transfers Transfers: Yes Sit to Stand: 5: Supervision;From chair/3-in-1;With armrests Sit to Stand Details (indicate cue type and reason): uses momentum and B UE's to assist 2nd c/o L LE weakness Stand to Sit: 5:  Supervision;With upper extremity assist;To bed Stand to Sit Details: increased time Ambulation/Gait Ambulation/Gait: No (Pt declined amb after TE's 2nd c/o fatigue and L knee pain) Ambulation/Gait Assistance: 5: Supervision Ambulation/Gait Assistance Details (indicate cue type and reason): pt declined ambulating in hallway.  ambulated in room 11 feet x3 with quick standing rest breaks, pt reports slight chest tightness, pt also reports his SOB is within his normal level. SaO2 97% HR 112 upon sitting in recliner Ambulation Distance (Feet): 11 Feet (x3) Assistive device: Straight cane Gait Pattern: Trendelenburg;Step-through pattern;Decreased stride length (bilateral toe out) Gait velocity: slow Stairs: No Wheelchair Mobility Wheelchair Mobility: No    Exercise  Total Joint Exercises Ankle Circles/Pumps: AROM;Both;10 reps;Supine Quad Sets: AROM;Both;10 reps;Supine Gluteal Sets: AROM;Both;10 reps;Supine Towel Squeeze: AROM;Both;10 reps;Supine Short Arc Quad: AROM;Both;10 reps;Supine Heel Slides: AROM;Both;10 reps;Supine Hip ABduction/ADduction: AROM;Both;10 reps;Supine Straight Leg Raises: AROM;AAROM;Both;10 reps;Supine (AAROM on L LE to decrease pain/stress) Long Arc Quad: AROM;Both;10 reps;Seated End of Session PT - End of Session Equipment Utilized During Treatment: Gait belt Activity Tolerance: Patient tolerated treatment well Patient left: in bed;with call bell in reach General Behavior During Session: [facility] for tasks performed Cognition: [facility] for tasks performed  Rica Koyanagi  PTA [facility]  Acute  Rehab Pager     [phone]

## 2011-11-13 NOTE — Progress Notes (Signed)
Subjective: Patient having chest pressure with dyspnea when ambulating at home.  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  Objective: Vital signs in last 24 hours: Temp:  [97.6 F (36.4 C)-98 F (36.7 C)] 97.9 F (36.6 C) (03/05 0606) Pulse Rate:  [76-87] 77  (03/05 0606) Resp:  [18-20] 18  (03/05 0606) BP: (112-155)/(69-79) 133/78 mmHg (03/05 0606) SpO2:  [95 %-100 %] 95 % (03/05 0606) Weight:  [128.822 kg (284 lb)-174.181 kg (384 lb)] 174.181 kg (384 lb) (03/04 1700) Weight change:  Last BM Date: 11/11/11  Intake/Output from previous day: 03/04 0701 - 03/05 0700 In: 382.5 [P.O.:240; I.V.:142.5] Out: 2200 [Urine:2200] Intake/Output this shift: Total I/O In: -  Out: 1400 [Urine:1400]  General appearance: alert and cooperative Resp: clear to auscultation bilaterally Cardio: regular rate and rhythm, S1, S2 normal, no murmur, click, rub or gallop GI: soft, non-tender; bowel sounds normal; no masses,  no organomegaly Extremities: extremities normal, atraumatic, no cyanosis or edema  Lab Results:  Basename 11/13/11 0455 11/12/11 1005  WBC 9.7 10.3  HGB 15.2 14.7  HCT 44.2 41.7  PLT 272 217   BMET  Basename 11/13/11 0455 11/12/11 1005  NA 133* 128*  K 3.8 4.1  CL 97 92*  CO2 23 24  GLUCOSE 227* 379*  BUN 12 14  CREATININE 0.64 0.56  CALCIUM 8.8 8.8    Studies/Results: Dg Chest 2 View  11/12/2011  *RADIOLOGY REPORT*  Clinical Data: Cough, congestion, short of breath and chest tightness  CHEST - 2 VIEW  Comparison: Chest x-ray of 10/18/2011  Findings: No active infiltrate or effusion is seen.  There is minimal fullness of the perihilar vasculature and mild pulmonary vascular congestion cannot be excluded.  There is moderate cardiomegaly present.  No acute bony abnormality is seen.  Multiple surgical clips are noted in the left and right upper quadrants. There are degenerative changes throughout the thoracic spine.  IMPRESSION:  1.  Moderate cardiomegaly.  2.  Cannot exclude mild pulmonary vascular congestion.  Original Report Authenticated By: Joretta Bachelor, M.D.   Ct Angio Chest W/cm &/or Wo Cm  11/12/2011  *RADIOLOGY REPORT*  Clinical Data: Shortness of breath.  CT ANGIOGRAPHY CHEST  Technique:  Multidetector CT imaging of the chest using the standard protocol during bolus administration of intravenous contrast. Multiplanar reconstructed images including MIPs were obtained and reviewed to evaluate the vascular anatomy.  Contrast: 154mL OMNIPAQUE IOHEXOL 350 MG/ML IV SOLN  Comparison: 04/12/2010  Findings: No filling defects in the pulmonary arteries to suggest pulmonary emboli.  Coronary artery calcifications within the LAD, left circumflex and right coronary.  Heart is borderline in size. Aorta is normal caliber.  Small scattered mediastinal lymph nodes, none pathologically enlarged.  No hilar or axillary adenopathy.  No confluent opacities in the lungs.  No effusions.  Imaging into the upper abdomen shows no acute findings. Postoperative changes in the left upper quadrant.  IMPRESSION: No evidence of pulmonary embolus.  Coronary artery disease.  Borderline heart size.  Original Report Authenticated By: Raelyn Number, M.D.    Medications: I have reviewed the patient's current medications.  Assessment/Plan: Principal Problem:  *Chest pain/Dyspnea  Suspect he is having angina.  He was not felt to be candidate for intervention last admission, will treat medically and follow.   Add nitrate Active Problems:  DM (diabetes mellitus) with complications  Poor control. Lantus and resistant scale with meal coverage  SOB (shortness of breath)  Suspect multifactorial secondary to cardiac ishemia/obesity/deconditioning.  No  PE, not particularly volume overloaded  Hyponatremia   improved   LOS: 1 day   Yukie Bergeron JOSEPH 11/13/2011, 6:30 AM

## 2011-11-13 NOTE — Evaluation (Signed)
Physical Therapy Evaluation Patient Details Name: [name] MRN: [id number] DOB: Oct 20, 1945 Today's Date: 11/13/2011  Problem List:  Patient Active Problem List  Diagnoses  . DM (diabetes mellitus) with complications  . Polyneuropathy in diabetes  . OSA (obstructive sleep apnea)  . Diabetic neuropathy, painful  . Painful bladder spasm  . Dehydration  . UTI (lower urinary tract infection)  . Hyperglycemia without ketosis  . Generalized headaches  . Hypertension  . Chest pain  . Dizziness  . NSVT (nonsustained ventricular tachycardia)  . SOB (shortness of breath)  . Hyponatremia    Past Medical History:  Past Medical History  Diagnosis Date  . Obese   . Hypertension   . Diabetes mellitus   . Diabetic diarrhea   . Sleep apnea   . Diabetic neuropathy, painful   . Chronic indwelling foley catheter   . Bladder spasms   . Complication of anesthesia   . PONV (postoperative nausea and vomiting)    Past Surgical History:  Past Surgical History  Procedure Date  . Joint replacement     Left Total Knee  . Gastroplasty   . Cholecystectomy     PT Assessment/Plan/Recommendation PT Assessment Clinical Impression Statement: Pt admitted for chest pain and SOB.  Pt with previous admission last month for similar diagnosis.  Pt would benefit from acute PT services in order to improve activity tolerance and strengthen LEs to prepare for d/c home alone.  Pt reports he cannot afford HHPT so HEP to be provided.  Pt currently at supervision level and reports he feels well enough to d/c home today or tomorrow. PT Recommendation/Assessment: Patient will need skilled PT in the acute care venue PT Problem List: Decreased strength;Decreased activity tolerance;Cardiopulmonary status limiting activity PT Therapy Diagnosis : Difficulty walking;Generalized weakness PT Plan PT Frequency: Min 3X/week PT Treatment/Interventions: Gait training;DME instruction;Functional mobility  training;Therapeutic activities;Therapeutic exercise;Patient/family education PT Recommendation Follow Up Recommendations: No PT follow up (recommend HHPT but pt reports he can't afford it) Equipment Recommended: None recommended by PT PT Goals  Acute Rehab PT Goals PT Goal Formulation: With patient Time For Goal Achievement: 7 days Pt will go Supine/Side to Sit: with modified independence;with HOB 0 degrees PT Goal: Supine/Side to Sit - Progress: Goal set today Pt will go Sit to Stand: with modified independence PT Goal: Sit to Stand - Progress: Goal set today Pt will go Stand to Sit: with modified independence PT Goal: Stand to Sit - Progress: Goal set today Pt will Ambulate: 16 - 50 feet;with modified independence;with least restrictive assistive device PT Goal: Ambulate - Progress: Goal set today Pt will Perform Home Exercise Program: Independently PT Goal: Perform Home Exercise Program - Progress: Goal set today  PT Evaluation Precautions/Restrictions    Prior Functioning  Home Living Lives With: Alone Type of Home: House Home Layout: One level Home Access: Level entry Home Adaptive Equipment: Straight cane;Walker - standard Additional Comments: states that he has a "large" standard walker.  Prior Function Level of Independence: Independent with basic ADLs;Independent with gait;Independent with transfers Driving: Yes Cognition Cognition Arousal/Alertness: Awake/alert Overall Cognitive Status: Appears within functional limits for tasks assessed Sensation/Coordination   Extremity Assessment RLE Assessment RLE Assessment: Within Functional Limits LLE Assessment LLE Assessment: Exceptions to [facility] LLE Strength LLE Overall Strength Comments: pt reports pain in L knee with extension/flexion, he reports he plans on having a knee replacement in the future Left Knee Flexion: 3+/5 Left Knee Extension: 3+/5 Mobility (including Balance) Bed Mobility Bed Mobility: Yes Supine  to Sit: 5: Supervision;HOB elevated (Comment degrees) Supine to Sit Details (indicate cue type and reason): pt adjusted bed to assist with transfer Transfers Transfers: Yes Sit to Stand: 5: Supervision;From bed;With upper extremity assist Sit to Stand Details (indicate cue type and reason): use of momentum to stand Stand to Sit: 5: Supervision;With upper extremity assist;To chair/3-in-1 Stand to Sit Details: use of armrest without cuing Ambulation/Gait Ambulation/Gait: Yes Ambulation/Gait Assistance: 5: Supervision Ambulation/Gait Assistance Details (indicate cue type and reason): pt declined ambulating in hallway.  ambulated in room 11 feet x3 with quick standing rest breaks, pt reports slight chest tightness, pt also reports his SOB is within his normal level. SaO2 97% HR 112 upon sitting in recliner Ambulation Distance (Feet): 11 Feet (x3) Assistive device: Straight cane Gait Pattern: Trendelenburg;Step-through pattern;Decreased stride length (bilateral toe out) Gait velocity: slow    Exercise    End of Session PT - End of Session Activity Tolerance: Patient tolerated treatment well Patient left: in chair;with call bell in reach;Other (comment) (with RN) General Behavior During Session: [facility] for tasks performed Cognition: [facility] for tasks performed  Corrissa Martello,KATHrine E 11/13/2011, 11:20 AM Pager: [phone]

## 2011-11-14 LAB — CARDIAC PANEL(CRET KIN+CKTOT+MB+TROPI)
CK, MB: 2.5 ng/mL (ref 0.3–4.0)
Total CK: 45 U/L (ref 7–232)

## 2011-11-14 LAB — GLUCOSE, CAPILLARY: Glucose-Capillary: 318 mg/dL — ABNORMAL HIGH (ref 70–99)

## 2011-11-14 LAB — BASIC METABOLIC PANEL
Calcium: 8.5 mg/dL (ref 8.4–10.5)
GFR calc Af Amer: >90 mL/min (ref >90)
GFR calc non Af Amer: >90 mL/min (ref >90)
Glucose, Bld: 264 mg/dL — ABNORMAL HIGH (ref 70–99)
Potassium: 3.6 mEq/L (ref 3.5–5.1)
Sodium: 133 mEq/L — ABNORMAL LOW (ref 135–145)

## 2011-11-14 MED ORDER — INSULIN GLARGINE 100 UNIT/ML ~~LOC~~ SOLN
70.0000 [IU] | Freq: Every day | SUBCUTANEOUS | Status: DC
  Administered 2011-11-14: 70 [IU] via SUBCUTANEOUS
  Filled 2011-11-14: qty 3

## 2011-11-14 MED ORDER — METOPROLOL TARTRATE 50 MG PO TABS
50.0000 mg | ORAL_TABLET | Freq: Two times a day (BID) | ORAL | Status: DC
  Administered 2011-11-14: 50 mg via ORAL
  Filled 2011-11-14 (×4): qty 1

## 2011-11-14 MED ORDER — ISOSORBIDE MONONITRATE ER 30 MG PO TB24: 30.0000 mg | ORAL_TABLET | Freq: Every day | ORAL | Status: DC

## 2011-11-14 MED ORDER — RAMIPRIL 5 MG PO CAPS: 5.0000 mg | ORAL_CAPSULE | Freq: Every day | ORAL | Status: DC

## 2011-11-14 MED ORDER — METOPROLOL TARTRATE 50 MG PO TABS: 50.0000 mg | ORAL_TABLET | Freq: Two times a day (BID) | ORAL | Status: DC

## 2011-11-14 NOTE — Plan of Care (Signed)
Problem: Consults Goal: Diabetes Guidelines if Diabetic/Glucose > 140 If diabetic or lab glucose is > 140 mg/dl - Initiate Diabetes/Hyperglycemia Guidelines & Document Interventions  Outcome: Not Met (add Reason) Patients glucose continues to be elevated.  Medication doses adjusted today.

## 2011-11-14 NOTE — Progress Notes (Signed)
Subjective: Two episodes of nausea with vomiting yesterday, both associated with pressure in chest while at rest.  Had some chest pressure with his usual dyspnea while ambulating with PT yesterday  Objective: Vital signs in last 24 hours: Temp:  [97.7 F (36.5 C)] 97.7 F (36.5 C) (03/06 0541) Pulse Rate:  [71-96] 71  (03/06 0541) Resp:  [20] 20  (03/06 0541) BP: (99-116)/(62-72) 116/69 mmHg (03/06 0541) SpO2:  [96 %-97 %] 96 % (03/06 0541) Weight change:  Last BM Date: 11/13/11  Intake/Output from previous day: 03/05 0701 - 03/06 0700 In: 924 [P.O.:924] Out: 1600 [Urine:1500; Emesis/NG output:100] Intake/Output this shift: Total I/O In: 444 [P.O.:444] Out: 700 [Urine:700]  Resp: clear to auscultation bilaterally Cardio: regular rate and rhythm, S1, S2 normal, no murmur, click, rub or gallop GI: soft, non-tender; bowel sounds normal; no masses,  no organomegaly  Lab Results:  Basename 11/13/11 0455 11/12/11 1005  WBC 9.7 10.3  HGB 15.2 14.7  HCT 44.2 41.7  PLT 272 217   BMET  Basename 11/14/11 0455 11/13/11 0455  NA 133* 133*  K 3.6 3.8  CL 99 97  CO2 28 23  GLUCOSE 264* 227*  BUN 13 12  CREATININE 0.65 0.64  CALCIUM 8.5 8.8    Studies/Results: Dg Chest 2 View  11/12/2011  *RADIOLOGY REPORT*  Clinical Data: Cough, congestion, short of breath and chest tightness  CHEST - 2 VIEW  Comparison: Chest x-ray of 10/18/2011  Findings: No active infiltrate or effusion is seen.  There is minimal fullness of the perihilar vasculature and mild pulmonary vascular congestion cannot be excluded.  There is moderate cardiomegaly present.  No acute bony abnormality is seen.  Multiple surgical clips are noted in the left and right upper quadrants. There are degenerative changes throughout the thoracic spine.  IMPRESSION:  1.  Moderate cardiomegaly. 2.  Cannot exclude mild pulmonary vascular congestion.  Original Report Authenticated By: Joretta Bachelor, M.D.   Ct Angio Chest W/cm &/or  Wo Cm  11/12/2011  *RADIOLOGY REPORT*  Clinical Data: Shortness of breath.  CT ANGIOGRAPHY CHEST  Technique:  Multidetector CT imaging of the chest using the standard protocol during bolus administration of intravenous contrast. Multiplanar reconstructed images including MIPs were obtained and reviewed to evaluate the vascular anatomy.  Contrast: 197mL OMNIPAQUE IOHEXOL 350 MG/ML IV SOLN  Comparison: 04/12/2010  Findings: No filling defects in the pulmonary arteries to suggest pulmonary emboli.  Coronary artery calcifications within the LAD, left circumflex and right coronary.  Heart is borderline in size. Aorta is normal caliber.  Small scattered mediastinal lymph nodes, none pathologically enlarged.  No hilar or axillary adenopathy.  No confluent opacities in the lungs.  No effusions.  Imaging into the upper abdomen shows no acute findings. Postoperative changes in the left upper quadrant.  IMPRESSION: No evidence of pulmonary embolus.  Coronary artery disease.  Borderline heart size.  Original Report Authenticated By: Raelyn Number, M.D.    Medications: I have reviewed the patient's current medications.  Assessment/Plan: Principal Problem:  *Chest pain/CAD  Still having what may be angina, increase metoprolol dose, may need to lower altace dose depending on blood pressure, will ask cardiology to see prior to discharge.   Active Problems:  DM (diabetes mellitus) with complications poor control, increasing lantus  Hyponatremia mild   LOS: 2 days   Charlene Cowdrey JOSEPH 11/14/2011, 6:41 AM

## 2011-11-14 NOTE — Discharge Summary (Signed)
Physician Discharge Summary  Patient ID: [name] MRN: [id number] DOB/AGE: Feb 01, 1946 66 y.o.  Admit date: 11/12/2011 Discharge date: 11/14/2011  Admission Diagnoses:chest pain Dyspnea Diabetes mellitus Coronary artery disease  Discharge Diagnoses:  Principal Problem:  *Chest pain Active Problems:  DM (diabetes mellitus) with complications  SOB (shortness of breath)  Hyponatremia   Discharged Condition: good  Hospital Course: the patient was admitted with increasing dyspnea on dysphoria mild exertion. He was very dyspneic and our exam room just walking around the room. He was not desaturating. A CT scan angiography of the chest showed coronary calcification but no pulmonary embolus. EKG was nonacute and cardiac enzymes remain negative. The patient was felt to possibly be having some angina with chest pressure and dyspnea on exertion. Nitrate was added to his regimen. Beta blocker dose was increased. On the second hospital day he did have 2 episodes of nausea with vomiting and chest pressure. His telemetry remained unremarkable. Verbal consultation with his cardiologist was done, they recommended medical management of his known coronary artery disease no significant and 90% distal RCA lesion discovered at the last admission. He was discharged with the addition of a nitrate, increase metoprolol dose and because his blood pressure was on the low side his Altace dose was decreased by half. CODE STATUS: Full code Diet, low-sodium diabetic diet Activity as tolerated  Consults: None  Significant Diagnostic Studies: labs: as above and radiology: CXR: normal and CT scan: as above  Treatments: cardiac meds: ramipril (Altace), metoprolol and isosorbide mononitrate  Discharge Exam: Blood pressure 109/66, pulse 69, temperature 98.2 F (36.8 C), temperature source Oral, resp. rate 20, height 6\' 1"  (1.854 m), weight 174.181 kg (384 lb), SpO2 96.00%. Resp: clear to auscultation  bilaterally Cardio: regular rate and rhythm, S1, S2 normal, no murmur, click, rub or gallop  Disposition: Home-Health Care Svc   Medication List  As of 11/14/2011  2:55 PM   TAKE these medications         aspirin 81 MG tablet   Take 1 tablet (81 mg total) by mouth daily.      clopidogrel 75 MG tablet   Commonly known as: PLAVIX   Take 1 tablet (75 mg total) by mouth daily with breakfast.      diphenhydrAMINE 25 mg capsule   Commonly known as: BENADRYL   Take 25 mg by mouth every 6 (six) hours as needed. For allergy.      diphenoxylate-atropine 2.5-0.025 MG per tablet   Commonly known as: LOMOTIL   Take 2 tablets by mouth 4 (four) times daily as needed. Diarrhea      insulin NPH-insulin regular (70-30) 100 UNIT/ML injection   Commonly known as: NOVOLIN 70/30   Inject 55 Units into the skin 2 (two) times daily with a meal.      isosorbide mononitrate 30 MG 24 hr tablet   Commonly known as: IMDUR   Take 1 tablet (30 mg total) by mouth daily.      Melatonin 3 MG Caps   Take 3 mg by mouth at bedtime as needed. Insomnia      metoprolol 50 MG tablet   Commonly known as: LOPRESSOR   Take 1 tablet (50 mg total) by mouth 2 (two) times daily.      nitroGLYCERIN 0.4 MG SL tablet   Commonly known as: NITROSTAT   Place 1 tablet (0.4 mg total) under the tongue every 5 (five) minutes x 3 doses as needed for chest pain.  omeprazole 20 MG capsule   Commonly known as: PRILOSEC   Take 20 mg by mouth daily.      oxybutynin 5 MG tablet   Commonly known as: DITROPAN   Take 5 mg by mouth 3 (three) times daily.      ramipril 5 MG capsule   Commonly known as: ALTACE   Take 1 capsule (5 mg total) by mouth daily.           Follow-up Information    Follow up with Eskridge, Marja Kays, MD.   Contact information:   [facility] [place] [phone]       Follow up with  Irven Shelling, MD in 8 days.         SignedIrven Shelling 11/14/2011, 2:55 PM

## 2011-11-14 NOTE — Progress Notes (Signed)
Pharmacy Note (Brief)  Urine Cx growing 100K Citrobacter. Discussed this with 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).  No further action will be taken.  Verdia Kuba, PharmD [phone] 11/14/2011 3:36 PM

## 2011-11-14 NOTE — Discharge Instructions (Signed)
Call us if you have any worsening of your chest pressure or increase shortness of breath. If you had an episode of significant chest pain lasting more than 30 minutes without being relieved by up to 3 nitroglycerin under your tongue, then go to the emergency room

## 2011-11-15 ENCOUNTER — Encounter (HOSPITAL_COMMUNITY): Payer: Self-pay | Admitting: Emergency Medicine

## 2011-11-15 ENCOUNTER — Inpatient Hospital Stay (HOSPITAL_COMMUNITY)
Admission: EM | Admit: 2011-11-15 | Discharge: 2011-11-20 | DRG: 392 | Disposition: A | Discharge status: Home Health | Payer: Medicare Other | Attending: Internal Medicine | Admitting: Internal Medicine

## 2011-11-15 DIAGNOSIS — Z96659 Presence of unspecified artificial knee joint: Secondary | ICD-10-CM

## 2011-11-15 DIAGNOSIS — K5289 Other specified noninfective gastroenteritis and colitis: Principal | ICD-10-CM | POA: Diagnosis present

## 2011-11-15 DIAGNOSIS — E1142 Type 2 diabetes mellitus with diabetic polyneuropathy: Secondary | ICD-10-CM | POA: Diagnosis present

## 2011-11-15 DIAGNOSIS — E1149 Type 2 diabetes mellitus with other diabetic neurological complication: Secondary | ICD-10-CM | POA: Diagnosis present

## 2011-11-15 DIAGNOSIS — I2510 Atherosclerotic heart disease of native coronary artery without angina pectoris: Secondary | ICD-10-CM

## 2011-11-15 DIAGNOSIS — E669 Obesity, unspecified: Secondary | ICD-10-CM | POA: Diagnosis present

## 2011-11-15 DIAGNOSIS — E876 Hypokalemia: Secondary | ICD-10-CM | POA: Diagnosis not present

## 2011-11-15 DIAGNOSIS — N3289 Other specified disorders of bladder: Secondary | ICD-10-CM | POA: Diagnosis present

## 2011-11-15 DIAGNOSIS — Z794 Long term (current) use of insulin: Secondary | ICD-10-CM

## 2011-11-15 DIAGNOSIS — R197 Diarrhea, unspecified: Secondary | ICD-10-CM | POA: Insufficient documentation

## 2011-11-15 DIAGNOSIS — E860 Dehydration: Secondary | ICD-10-CM | POA: Diagnosis present

## 2011-11-15 DIAGNOSIS — I10 Essential (primary) hypertension: Secondary | ICD-10-CM | POA: Diagnosis present

## 2011-11-15 DIAGNOSIS — E0800 Diabetes mellitus due to underlying condition with hyperosmolarity without nonketotic hyperglycemic-hyperosmolar coma (NKHHC): Secondary | ICD-10-CM | POA: Diagnosis present

## 2011-11-15 DIAGNOSIS — E871 Hypo-osmolality and hyponatremia: Secondary | ICD-10-CM | POA: Diagnosis present

## 2011-11-15 DIAGNOSIS — K529 Noninfective gastroenteritis and colitis, unspecified: Secondary | ICD-10-CM

## 2011-11-15 DIAGNOSIS — E785 Hyperlipidemia, unspecified: Secondary | ICD-10-CM | POA: Diagnosis present

## 2011-11-15 DIAGNOSIS — G4730 Sleep apnea, unspecified: Secondary | ICD-10-CM | POA: Diagnosis present

## 2011-11-15 DIAGNOSIS — R1110 Vomiting, unspecified: Secondary | ICD-10-CM

## 2011-11-15 LAB — DIFFERENTIAL
Eosinophils Absolute: 0.1 10*3/uL (ref 0.0–0.7)
Eosinophils Relative: 1 % (ref 0–5)
Lymphocytes Relative: 5 % — ABNORMAL LOW (ref 12–46)
Lymphs Abs: 0.4 10*3/uL — ABNORMAL LOW (ref 0.7–4.0)
Monocytes Absolute: 0.5 10*3/uL (ref 0.1–1.0)
Monocytes Relative: 6 % (ref 3–12)

## 2011-11-15 LAB — GLUCOSE, CAPILLARY: Glucose-Capillary: 292 mg/dL — ABNORMAL HIGH (ref 70–99)

## 2011-11-15 LAB — CBC
HCT: 43 % (ref 39.0–52.0)
MCH: 29.9 pg (ref 26.0–34.0)
MCV: 86.2 fL (ref 78.0–100.0)
Platelets: 255 10*3/uL (ref 150–400)
RBC: 4.99 MIL/uL (ref 4.22–5.81)
WBC: 8.7 10*3/uL (ref 4.0–10.5)

## 2011-11-15 LAB — COMPREHENSIVE METABOLIC PANEL
ALT: 18 U/L (ref 0–53)
BUN: 14 mg/dL (ref 6–23)
CO2: 25 mEq/L (ref 19–32)
Calcium: 8.2 mg/dL — ABNORMAL LOW (ref 8.4–10.5)
Creatinine, Ser: 0.57 mg/dL (ref 0.50–1.35)
GFR calc Af Amer: >90 mL/min (ref >90)
GFR calc non Af Amer: >90 mL/min (ref >90)
Glucose, Bld: 277 mg/dL — ABNORMAL HIGH (ref 70–99)
Sodium: 131 mEq/L — ABNORMAL LOW (ref 135–145)
Total Protein: 6.7 g/dL (ref 6.0–8.3)

## 2011-11-15 LAB — POCT I-STAT TROPONIN I: Troponin i, poc: 0 ng/mL (ref 0.00–0.08)

## 2011-11-15 LAB — LIPASE, BLOOD: Lipase: 12 U/L (ref 11–59)

## 2011-11-15 MED ORDER — ONDANSETRON HCL 4 MG/2ML IJ SOLN
4.0000 mg | Freq: Once | INTRAMUSCULAR | Status: AC
  Administered 2011-11-15: 4 mg via INTRAVENOUS
  Filled 2011-11-15: qty 2

## 2011-11-15 MED ORDER — INSULIN GLARGINE 100 UNIT/ML ~~LOC~~ SOLN
10.0000 [IU] | Freq: Every day | SUBCUTANEOUS | Status: DC
  Administered 2011-11-16: 10 [IU] via SUBCUTANEOUS
  Filled 2011-11-15: qty 3

## 2011-11-15 MED ORDER — LOPERAMIDE HCL 2 MG PO CAPS
4.0000 mg | ORAL_CAPSULE | Freq: Once | ORAL | Status: AC
  Administered 2011-11-15: 4 mg via ORAL
  Filled 2011-11-15: qty 2

## 2011-11-15 MED ORDER — METOCLOPRAMIDE HCL 5 MG/ML IJ SOLN
10.0000 mg | Freq: Once | INTRAMUSCULAR | Status: AC
  Administered 2011-11-15: 10 mg via INTRAVENOUS
  Filled 2011-11-15: qty 2

## 2011-11-15 MED ORDER — SODIUM CHLORIDE 0.9 % IV BOLUS (SEPSIS)
1000.0000 mL | Freq: Once | INTRAVENOUS | Status: AC
  Administered 2011-11-15: 1000 mL via INTRAVENOUS

## 2011-11-15 MED ORDER — SODIUM CHLORIDE 0.9 % IV SOLN
INTRAVENOUS | Status: DC
  Administered 2011-11-15: 18:00:00 via INTRAVENOUS

## 2011-11-15 MED ORDER — FENTANYL CITRATE 0.05 MG/ML IJ SOLN
100.0000 ug | Freq: Once | INTRAMUSCULAR | Status: AC
  Administered 2011-11-15: 100 ug via INTRAVENOUS
  Filled 2011-11-15: qty 2

## 2011-11-15 MED ORDER — INSULIN ASPART 100 UNIT/ML ~~LOC~~ SOLN
0.0000 [IU] | Freq: Three times a day (TID) | SUBCUTANEOUS | Status: DC
  Administered 2011-11-16 (×2): 11 [IU] via SUBCUTANEOUS
  Administered 2011-11-17 (×3): 7 [IU] via SUBCUTANEOUS
  Administered 2011-11-18 (×2): 4 [IU] via SUBCUTANEOUS
  Administered 2011-11-18: 7 [IU] via SUBCUTANEOUS
  Administered 2011-11-19 (×3): 4 [IU] via SUBCUTANEOUS
  Filled 2011-11-15: qty 3

## 2011-11-15 NOTE — H&P (Signed)
PCP:   Irven Shelling, MD, MD   Chief Complaint:  Diarrhea and emesis  HPI: Pt is a 66 y/o CM with PMH of hypertension, diabetes, obesity, coronary artery disease, hyperlipidemia and chronic indwelling Foley catheter.  Presents to the hospital today secondary to diarrhea and emesis which he reports occurred this morning.  Yesterday he was not feeling well and had some diarrhea.  But today even though he has tried lomotil and imodium he reports that it did not ameliorate his symptoms.  Became weakened at home and decided to come to the hospital for further evaluation.  In the ED patient has been afebrile, WBC count is 8.7 and BMP showed mild hyponatremia and hypocalcemia.  Cardiac enzymes were negative.  We were consulted for evaluation for admission given his current complaints.  Allergies:   Allergies  Allergen Reactions  . Cefadroxil Hives  . Cephalexin Hives  . Lipitor (Atorvastatin Calcium) Swelling  . Metformin And Related Swelling  . Morphine And Related Other (See Comments)    Sweating, feels like is "in rocky boat."  . Robaxin Other (See Comments)    Feels like is in "rocky boat"      Past Medical History  Diagnosis Date  . Obese   . Hypertension   . Diabetes mellitus   . Diabetic diarrhea   . Sleep apnea   . Diabetic neuropathy, painful   . Chronic indwelling foley catheter   . Bladder spasms   . Complication of anesthesia   . PONV (postoperative nausea and vomiting)     Past Surgical History  Procedure Date  . Joint replacement     Left Total Knee  . Gastroplasty   . Cholecystectomy     Prior to Admission medications   Medication Sig Start Date End Date Taking? Authorizing Provider  aspirin 81 MG tablet Take 1 tablet (81 mg total) by mouth daily. 10/29/11 10/28/12 Yes Irven Shelling, MD  clopidogrel (PLAVIX) 75 MG tablet Take 1 tablet (75 mg total) by mouth daily with breakfast. 10/29/11 10/28/12 Yes Irven Shelling, MD  diphenhydrAMINE (BENADRYL)  25 mg capsule Take 25 mg by mouth every 6 (six) hours as needed. For allergy.   Yes Historical Provider, MD  diphenoxylate-atropine (LOMOTIL) 2.5-0.025 MG per tablet Take 2 tablets by mouth 4 (four) times daily as needed. Diarrhea   Yes Historical Provider, MD  insulin NPH-insulin regular (NOVOLIN 70/30) (70-30) 100 UNIT/ML injection Inject 55 Units into the skin 2 (two) times daily with a meal.   Yes Historical Provider, MD  isosorbide mononitrate (IMDUR) 30 MG 24 hr tablet Take 1 tablet (30 mg total) by mouth daily. 11/14/11 11/13/12 Yes Irven Shelling, MD  Melatonin 3 MG CAPS Take 3 mg by mouth at bedtime as needed. Insomnia   Yes Historical Provider, MD  metoprolol tartrate (LOPRESSOR) 50 MG tablet Take 1 tablet (50 mg total) by mouth 2 (two) times daily. 11/14/11  Yes Irven Shelling, MD  nitroGLYCERIN (NITROSTAT) 0.4 MG SL tablet Place 1 tablet (0.4 mg total) under the tongue every 5 (five) minutes x 3 doses as needed for chest pain. 10/29/11 10/28/12 Yes Irven Shelling, MD  omeprazole (PRILOSEC) 20 MG capsule Take 20 mg by mouth daily.   Yes Historical Provider, MD  oxybutynin (DITROPAN) 5 MG tablet Take 5 mg by mouth 3 (three) times daily.   Yes Historical Provider, MD  ramipril (ALTACE) 5 MG capsule Take 1 capsule (5 mg total) by mouth daily. 11/14/11  Yes John  Deloris Ping, MD    Social History:  reports that he has quit smoking. He has never used smokeless tobacco. He reports that he does not drink alcohol or use illicit drugs.  History reviewed. No pertinent family history.  Review of Systems:  Constitutional: Denies fever, chills, diaphoresis, appetite change and fatigue.  HEENT: Denies photophobia, eye pain, redness, hearing loss, ear pain, congestion, sore throat, rhinorrhea, sneezing, mouth sores, trouble swallowing, neck pain, neck stiffness and tinnitus.   Respiratory: Denies SOB, DOE, cough, chest tightness,  and wheezing.   Cardiovascular: Denies chest pain,  palpitations and leg swelling.  Gastrointestinal: + nausea, + vomiting, abdominal pain, + diarrhea, denies constipation, blood in stool and abdominal distention.  Genitourinary: Denies dysuria, urgency, frequency, hematuria, flank pain and difficulty urinating.  Musculoskeletal: Denies myalgias, back pain, joint swelling, arthralgias and gait problem.  Skin: Denies pallor, rash and wound.  Neurological: Denies dizziness, seizures, syncope, weakness, light-headedness, numbness and headaches.  Hematological: Denies adenopathy. Easy bruising, personal or family bleeding history  Psychiatric/Behavioral: Denies suicidal ideation, mood changes, confusion, nervousness, sleep disturbance and agitation   Physical Exam: Blood pressure 141/55, pulse 79, temperature 97.7 F (36.5 C), temperature source Oral, resp. rate 20, SpO2 100.00%. General: Alert, awake, oriented x3, in no acute distress. HEENT: No bruits, no goiter. Heart: Regular rate and rhythm, without murmurs, rubs, gallops. Lungs: Clear to auscultation bilaterally. Abdomen: Soft, generalized tenderness no guarding or rebound tenderness, nondistended, positive bowel sounds. Extremities: No clubbing cyanosis or edema with positive pedal pulses.  Patient has gauze and bandage over lateral right heel and abrassion over 2 toe on right foot Neuro: Grossly intact, nonfocal.   Labs on Admission:  Results for orders placed during the hospital encounter of 11/15/11 (from the past 48 hour(s))  CBC     Status: Normal   Collection Time   11/15/11  3:47 PM      Component Value Range Comment   WBC 8.7  4.0 - 10.5 (K/uL)    RBC 4.99  4.22 - 5.81 (MIL/uL)    Hemoglobin 14.9  13.0 - 17.0 (g/dL)    HCT 43.0  39.0 - 52.0 (%)    MCV 86.2  78.0 - 100.0 (fL)    MCH 29.9  26.0 - 34.0 (pg)    MCHC 34.7  30.0 - 36.0 (g/dL)    RDW 13.4  11.5 - 15.5 (%)    Platelets 255  150 - 400 (K/uL)   DIFFERENTIAL     Status: Abnormal   Collection Time   11/15/11  3:47 PM       Component Value Range Comment   Neutrophils Relative 87 (*) 43 - 77 (%)    Neutro Abs 7.6  1.7 - 7.7 (K/uL)    Lymphocytes Relative 5 (*) 12 - 46 (%)    Lymphs Abs 0.4 (*) 0.7 - 4.0 (K/uL)    Monocytes Relative 6  3 - 12 (%)    Monocytes Absolute 0.5  0.1 - 1.0 (K/uL)    Eosinophils Relative 1  0 - 5 (%)    Eosinophils Absolute 0.1  0.0 - 0.7 (K/uL)    Basophils Relative 0  0 - 1 (%)    Basophils Absolute 0.0  0.0 - 0.1 (K/uL)   COMPREHENSIVE METABOLIC PANEL     Status: Abnormal   Collection Time   11/15/11  3:47 PM      Component Value Range Comment   Sodium 131 (*) 135 - 145 (mEq/L)    Potassium  4.7  3.5 - 5.1 (mEq/L)    Chloride 97  96 - 112 (mEq/L)    CO2 25  19 - 32 (mEq/L)    Glucose, Bld 277 (*) 70 - 99 (mg/dL)    BUN 14  6 - 23 (mg/dL)    Creatinine, Ser 0.57  0.50 - 1.35 (mg/dL)    Calcium 8.2 (*) 8.4 - 10.5 (mg/dL)    Total Protein 6.7  6.0 - 8.3 (g/dL)    Albumin 3.2 (*) 3.5 - 5.2 (g/dL)    AST 24  0 - 37 (U/L) SLIGHT HEMOLYSIS   ALT 18  0 - 53 (U/L)    Alkaline Phosphatase 83  39 - 117 (U/L)    Total Bilirubin 0.7  0.3 - 1.2 (mg/dL)    GFR calc non Af Amer >90  >90 (mL/min)    GFR calc Af Amer >90  >90 (mL/min)   LIPASE, BLOOD     Status: Normal   Collection Time   11/15/11  3:47 PM      Component Value Range Comment   Lipase 12  11 - 59 (U/L)   POCT I-STAT TROPONIN I     Status: Normal   Collection Time   11/15/11  3:58 PM      Component Value Range Comment   Troponin i, poc 0.00  0.00 - 0.08 (ng/mL)    Comment 3              Radiological Exams on Admission: No results found.  Assessment/Plan Dehydration:  At this point will plan on hydrating patient with normal saline overnight and reassess in the morning.  Likely related to Recent emesis and Diarrhea.  Suspecting viral gastroenteritis.  Hyponatremia:  Likely due to poor oral intake.  Will rehydrate and reassess tomorrow in the am.  Emesis:  At this point suspect viral gastroenteritis.  Was told that  fourth floor had norovirus and that several of the nurses on that floor had contracted it and were home as a result.  Pt reports that he was on the fourth floor recently.  At this point will provide antiemetics IV and supportive care.  Diarrhea: As indicated above though to be secondary to noro virus infection.  Will order noro virus PCR, C difficil toxin pcr, and stool cultures.  Otherwise treatment will be supportive.  Check magnesium level and potassium level tomorrow.  DM:  Will plan on holding home regimen.  Checking CBG's, placing on diabetic diet, Lantus and SSI.  Last BMP blood sugars 264-277.  Will have wound care consult to evaluate and treat right foot.  HTN:  At this point has fluctuated from 109/66 to 141/55  CAD: Will plan on continuing home regimen.  Disposition:  Will admit tonight and have Lavone Orn assume care tomorrow 11/16/11.  Have left voicemail indicating such.  Will hold off on antibiotics as currently suspect viral gastroenteritis.       Time Spent on Admission: 55 min, Documenting, obtaining history, PE, Updating information systems, collecting information, reviewing old chart information, and billing.  Velvet Bathe [facility] Pager: [phone] 11/15/2011, 6:43 PM

## 2011-11-15 NOTE — ED Notes (Signed)
Patient requested Bariatric Bedside Commode. No available ones found in ED. Portable called. Portable Tech Nikki not sure if we have any available hospital wide at this time. RN notified.

## 2011-11-15 NOTE — ED Notes (Signed)
Per EMS.  Pt has been having n/v/d for past 4-5 hours. EMS found pt stuck on toliet. EMS states home not set up for needs.  Recently discharged from [facility] fro HF/sepsis/bladder infection.  Got home yesterday.

## 2011-11-15 NOTE — ED Notes (Signed)
Pt came in today because he has been unable to get off commode because of n/v/d and then was physically unable to get up. Called EMS for help. Took 4 lomodal and 4 immodium with no relief.

## 2011-11-15 NOTE — ED Provider Notes (Signed)
History     CSN: [id number]  Arrival date & time 11/15/11  1456   First MD Initiated Contact with Patient 11/15/11 1511      Chief Complaint  Patient presents with  . Diarrhea  . Emesis    (Consider location/radiation/quality/duration/timing/severity/associated sxs/prior treatment) HPI Discharged yesterday for eval of chronic CP/SOB/fatigue/weakness, lives alone, has refused home health aid, uses cane, yesterday before discharge started vomiting, now multiple nonbloody spells N/V/D intermittent diffuse moderate abd cramps/pain since yesterday nonfocal and not constant, has chronic Foley with colonization, feels dehydrated with dry mouth and light headed but able to walk today, no CP or SOB now. No fever. Took 4 Imodium and 4 Lomotil earlier today no relief. Past Medical History  Diagnosis Date  . Obese   . Hypertension   . Diabetes mellitus   . Diabetic diarrhea   . Sleep apnea   . Diabetic neuropathy, painful   . Chronic indwelling foley catheter   . Bladder spasms   . Complication of anesthesia   . PONV (postoperative nausea and vomiting)     Past Surgical History  Procedure Date  . Joint replacement     Left Total Knee  . Gastroplasty   . Cholecystectomy     History reviewed. No pertinent family history.  History  Substance Use Topics  . Smoking status: Former Smoker -- 2.0 packs/day  . Smokeless tobacco: Never Used  . Alcohol Use: No      Review of Systems  Constitutional: Positive for fatigue. Negative for fever.       10 Systems reviewed and are negative for acute change except as noted in the HPI.  HENT: Negative for congestion.   Eyes: Negative for discharge and redness.  Respiratory: Negative for cough and shortness of breath.   Cardiovascular: Negative for chest pain.  Gastrointestinal: Positive for nausea, vomiting, abdominal pain and diarrhea. Negative for blood in stool.  Musculoskeletal: Negative for back pain.  Skin: Negative for rash.    Neurological: Positive for light-headedness. Negative for syncope, numbness and headaches.  Psychiatric/Behavioral:       No behavior change.    Allergies  Cefadroxil; Cephalexin; Lipitor; Metformin and related; Morphine and related; and Robaxin  Home Medications   No current outpatient prescriptions on file.  BP 120/78  Pulse 71  Temp(Src) 98.5 F (36.9 C) (Axillary)  Resp 21  Ht 6\' 1"  (1.854 m)  Wt 407 lb 10.1 oz (184.9 kg)  BMI 53.78 kg/m2  SpO2 99%  Physical Exam  Nursing note and vitals reviewed. Constitutional: He is oriented to person, place, and time.       Awake, alert, nontoxic appearance.  HENT:  Head: Atraumatic.       Mouth dry mucosa  Eyes: Right eye exhibits no discharge. Left eye exhibits no discharge.  Neck: Neck supple.  Cardiovascular: Normal rate and regular rhythm.   No murmur heard. Pulmonary/Chest: Effort normal and breath sounds normal. No respiratory distress. He has no wheezes. He has no rales. He exhibits no tenderness.  Abdominal: Soft. Bowel sounds are normal. He exhibits no mass. There is tenderness. There is no rebound and no guarding.       Mild diffuse tenderness  Musculoskeletal: He exhibits edema. He exhibits no tenderness.       Baseline ROM, no obvious new focal weakness.chronic stasis dermatitis with edema without cellulitis.  Neurological: He is alert and oriented to person, place, and time.       Mental status and motor strength appears  baseline for patient and situation.  Skin: No rash noted.  Psychiatric: He has a normal mood and affect.    ED Course  Procedures (including critical care time) Pt still feels too weak for discharge and states does not feel safe at home alone, ongoing abd pain and nausea in ED not able to take po fluids, paged [facility] for Obs eval.[id number] Labs Reviewed  DIFFERENTIAL - Abnormal; Notable for the following:    Neutrophils Relative 87 (*)    Lymphocytes Relative 5 (*)    Lymphs Abs 0.4 (*)    All  other components within normal limits  COMPREHENSIVE METABOLIC PANEL - Abnormal; Notable for the following:    Sodium 131 (*)    Glucose, Bld 277 (*)    Calcium 8.2 (*)    Albumin 3.2 (*)    All other components within normal limits  GLUCOSE, CAPILLARY - Abnormal; Notable for the following:    Glucose-Capillary 234 (*)    All other components within normal limits  BASIC METABOLIC PANEL - Abnormal; Notable for the following:    Sodium 131 (*)    Glucose, Bld 233 (*)    Calcium 7.8 (*)    All other components within normal limits  GLUCOSE, CAPILLARY - Abnormal; Notable for the following:    Glucose-Capillary 248 (*)    All other components within normal limits  GLUCOSE, CAPILLARY - Abnormal; Notable for the following:    Glucose-Capillary 263 (*)    All other components within normal limits  CBC  LIPASE, BLOOD  POCT I-STAT TROPONIN I  MAGNESIUM  CBC  CLOSTRIDIUM DIFFICILE BY PCR  NOROVIRUS GROUP 1 & 2 BY PCR  STOOL CULTURE  BASIC METABOLIC PANEL   No results found.   1. Dehydration   2. Vomiting   3. Diarrhea       MDM  The patient appears reasonably stabilized for admission considering the current resources, flow, and capabilities available in the ED at this time, and I doubt any other [facility] requiring further screening and/or treatment in the ED prior to admission.        Babette Relic, MD 11/16/11 2111

## 2011-11-15 NOTE — ED Notes (Signed)
MD at bedside. 

## 2011-11-15 NOTE — ED Notes (Signed)
Attempted to call report again.  RN to return call.

## 2011-11-15 NOTE — ED Notes (Signed)
[id number] Expected date:11/15/11<BR> Expected time: 2:45 PM<BR> Means of arrival:Ambulance<BR> Comments:<BR> M40. 66 yo m. Sick, n/v/d, just released from [facility] with heart failure, 450lbs. 15 mins

## 2011-11-15 NOTE — ED Notes (Signed)
Attempted to call report to floor.  RN to return call.

## 2011-11-16 DIAGNOSIS — K529 Noninfective gastroenteritis and colitis, unspecified: Secondary | ICD-10-CM

## 2011-11-16 LAB — BASIC METABOLIC PANEL
Calcium: 7.8 mg/dL — ABNORMAL LOW (ref 8.4–10.5)
Chloride: 100 mEq/L (ref 96–112)
Creatinine, Ser: 0.62 mg/dL (ref 0.50–1.35)
GFR calc Af Amer: >90 mL/min (ref >90)
Sodium: 131 mEq/L — ABNORMAL LOW (ref 135–145)

## 2011-11-16 LAB — MAGNESIUM: Magnesium: 1.5 mg/dL (ref 1.5–2.5)

## 2011-11-16 LAB — CBC
Platelets: 167 10*3/uL (ref 150–400)
RDW: 13.6 % (ref 11.5–15.5)
WBC: 6.4 10*3/uL (ref 4.0–10.5)

## 2011-11-16 LAB — GLUCOSE, CAPILLARY

## 2011-11-16 LAB — URINE CULTURE
Colony Count: >=100000
Culture  Setup Time: 201303050120

## 2011-11-16 LAB — CLOSTRIDIUM DIFFICILE BY PCR: Toxigenic C. Difficile by PCR: NEGATIVE

## 2011-11-16 MED ORDER — CLOPIDOGREL BISULFATE 75 MG PO TABS
75.0000 mg | ORAL_TABLET | Freq: Every day | ORAL | Status: DC
  Administered 2011-11-16 – 2011-11-19 (×4): 75 mg via ORAL
  Filled 2011-11-16 (×5): qty 1

## 2011-11-16 MED ORDER — ISOSORBIDE MONONITRATE ER 30 MG PO TB24
30.0000 mg | ORAL_TABLET | Freq: Every day | ORAL | Status: DC
  Administered 2011-11-16 – 2011-11-19 (×4): 30 mg via ORAL
  Filled 2011-11-16 (×5): qty 1

## 2011-11-16 MED ORDER — DIPHENOXYLATE-ATROPINE 2.5-0.025 MG PO TABS
1.0000 | ORAL_TABLET | Freq: Every day | ORAL | Status: DC | PRN
  Administered 2011-11-16 – 2011-11-19 (×5): 1 via ORAL
  Administered 2011-11-19: 2 via ORAL
  Filled 2011-11-16 (×5): qty 1
  Filled 2011-11-16: qty 2

## 2011-11-16 MED ORDER — ACETAMINOPHEN 650 MG RE SUPP: 650.0000 mg | Freq: Four times a day (QID) | RECTAL | Status: DC | PRN

## 2011-11-16 MED ORDER — ONDANSETRON HCL 4 MG PO TABS
4.0000 mg | ORAL_TABLET | Freq: Four times a day (QID) | ORAL | Status: DC | PRN
  Administered 2011-11-18: 4 mg via ORAL
  Filled 2011-11-16: qty 1

## 2011-11-16 MED ORDER — METOPROLOL TARTRATE 50 MG PO TABS
50.0000 mg | ORAL_TABLET | Freq: Two times a day (BID) | ORAL | Status: DC
  Administered 2011-11-16 – 2011-11-19 (×9): 50 mg via ORAL
  Filled 2011-11-16 (×11): qty 1

## 2011-11-16 MED ORDER — HEPARIN SODIUM (PORCINE) 5000 UNIT/ML IJ SOLN
5000.0000 [IU] | Freq: Three times a day (TID) | INTRAMUSCULAR | Status: DC
  Administered 2011-11-16 – 2011-11-20 (×14): 5000 [IU] via SUBCUTANEOUS
  Filled 2011-11-16 (×18): qty 1

## 2011-11-16 MED ORDER — NITROGLYCERIN 0.4 MG SL SUBL
0.4000 mg | SUBLINGUAL_TABLET | SUBLINGUAL | Status: DC | PRN
  Administered 2011-11-17 (×2): 0.4 mg via SUBLINGUAL
  Filled 2011-11-16: qty 25

## 2011-11-16 MED ORDER — INSULIN GLARGINE 100 UNIT/ML ~~LOC~~ SOLN
40.0000 [IU] | Freq: Every day | SUBCUTANEOUS | Status: DC
  Administered 2011-11-16 – 2011-11-17 (×2): 40 [IU] via SUBCUTANEOUS
  Filled 2011-11-16: qty 3

## 2011-11-16 MED ORDER — SODIUM CHLORIDE 0.9 % IV SOLN
INTRAVENOUS | Status: DC
  Administered 2011-11-17: 09:00:00 via INTRAVENOUS
  Administered 2011-11-17: 75 mL via INTRAVENOUS

## 2011-11-16 MED ORDER — ACETAMINOPHEN 325 MG PO TABS
650.0000 mg | ORAL_TABLET | Freq: Four times a day (QID) | ORAL | Status: DC | PRN
  Administered 2011-11-16: 650 mg via ORAL
  Filled 2011-11-16: qty 2

## 2011-11-16 MED ORDER — ONDANSETRON HCL 4 MG/2ML IJ SOLN
4.0000 mg | Freq: Four times a day (QID) | INTRAMUSCULAR | Status: DC | PRN
  Administered 2011-11-16 – 2011-11-17 (×2): 4 mg via INTRAVENOUS
  Filled 2011-11-16 (×3): qty 2

## 2011-11-16 MED ORDER — OXYBUTYNIN CHLORIDE 5 MG PO TABS
5.0000 mg | ORAL_TABLET | Freq: Three times a day (TID) | ORAL | Status: DC
  Administered 2011-11-16 – 2011-11-19 (×12): 5 mg via ORAL
  Filled 2011-11-16 (×15): qty 1

## 2011-11-16 MED ORDER — PANTOPRAZOLE SODIUM 40 MG PO TBEC
40.0000 mg | DELAYED_RELEASE_TABLET | Freq: Every day | ORAL | Status: DC
  Administered 2011-11-16 – 2011-11-19 (×4): 40 mg via ORAL
  Filled 2011-11-16 (×5): qty 1

## 2011-11-16 MED ORDER — RAMIPRIL 5 MG PO CAPS
5.0000 mg | ORAL_CAPSULE | Freq: Every day | ORAL | Status: DC
  Administered 2011-11-16 – 2011-11-19 (×4): 5 mg via ORAL
  Filled 2011-11-16 (×5): qty 1

## 2011-11-16 MED ORDER — ASPIRIN EC 81 MG PO TBEC
81.0000 mg | DELAYED_RELEASE_TABLET | Freq: Every day | ORAL | Status: DC
  Administered 2011-11-16 – 2011-11-19 (×4): 81 mg via ORAL
  Filled 2011-11-16 (×5): qty 1

## 2011-11-16 MED ORDER — DIPHENOXYLATE-ATROPINE 2.5-0.025 MG PO TABS
2.0000 | ORAL_TABLET | Freq: Once | ORAL | Status: AC
  Administered 2011-11-16: 2 via ORAL
  Filled 2011-11-16: qty 2

## 2011-11-16 NOTE — Progress Notes (Signed)
Inpatient Diabetes Program Recommendations  AACE/ADA: New Consensus Statement on Inpatient Glycemic Control (2009)  Target Ranges:  Prepandial:   less than 140 mg/dL      Peak postprandial:   less than 180 mg/dL (1-2 hours)      Critically ill patients:  140 - 180 mg/dL   Reason for Visit: Hyperglycemia / Consult  Results for KEELON, BUEHL (MRN [id number]) as of 11/16/2011 11:53  Ref. Range 11/14/2011 07:20 11/14/2011 11:31 11/15/2011 22:53 11/15/2011 23:58  Glucose-Capillary Latest Range: 70-99 mg/dL 318 (H) 292 (H) 234 (H) 248 (H)  Results for JARIAN, ODETTE (MRN [id number]) as of 11/16/2011 11:53  Ref. Range 11/14/2011 04:55 11/15/2011 15:47 11/16/2011 03:56  Glucose Latest Range: 70-99 mg/dL 264 (H) 277 (H) 233 (H)    Inpatient Diabetes Program Recommendations Insulin - Basal: Increase Lantus to 65 units QHS Correction (SSI): . Insulin - Meal Coverage: . Outpatient Referral: .  Note: When CL diet advances, recommend CHO-mod med.  When po intake improves, d/c Lantus and begin home dose of 70/30 55 units bid.  Will follow. Gordy Councilman, RD, LDN, CDE Pager:  [phone]

## 2011-11-16 NOTE — Progress Notes (Signed)
Subjective: No vomiting and less diarrhea overnight  Objective: Vital signs in last 24 hours: Temp:  [97.7 F (36.5 C)-98.5 F (36.9 C)] 98.3 F (36.8 C) (03/07 2348) Pulse Rate:  [79-89] 89  (03/07 2348) Resp:  [16-20] 18  (03/07 2348) BP: (122-141)/(55-80) 129/80 mmHg (03/07 2348) SpO2:  [95 %-100 %] 95 % (03/07 2348) Weight:  [184.9 kg (407 lb 10.1 oz)] 184.9 kg (407 lb 10.1 oz) (03/07 2348) Weight change:     Intake/Output from previous day:   Intake/Output this shift:    General appearance: alert and cooperative Resp: clear to auscultation bilaterally Cardio: regular rate and rhythm, S1, S2 normal, no murmur, click, rub or gallop GI: soft, non-tender; bowel sounds normal; no masses,  no organomegaly Extremities  No edema, small abrasions R heel and R 2nd toe  Lab Results:  Basename 11/16/11 0356 11/15/11 1547  WBC 6.4 8.7  HGB 13.3 14.9  HCT 39.2 43.0  PLT 167 255   BMET  Basename 11/16/11 0356 11/15/11 1547  NA 131* 131*  K 3.6 4.7  CL 100 97  CO2 24 25  GLUCOSE 233* 277*  BUN 10 14  CREATININE 0.62 0.57  CALCIUM 7.8* 8.2*    Studies/Results: No results found.  Medications: I have reviewed the patient's current medications.  Assessment/Plan: Principal Problem:  *Acute gastroenteritis  Suspect he has acute viral gastroenteritis, clears, ivfs, advance as tolerated Active Problems:  DM (diabetes mellitus) with complications   lantus and SSI  Dehydration mild continue IVFs today  Hypertension ok  Hyponatremia  Mild, iv saline  CAD (coronary artery disease) recent diagnosis, 90% RCA lesion, medical management, antianginals adjusted at last visit   R foot abrasions   Wound care consult   Disposition  Anticipate here over weekend, hope to discharge monday   LOS: 1 day   Jeilani Grupe JOSEPH 11/16/2011, 6:00 AM

## 2011-11-16 NOTE — Plan of Care (Signed)
Problem: Phase I Progression Outcomes Goal: Voiding-avoid urinary catheter unless indicated Outcome: Not Met (add Reason) Pt has chronic indwelling Foley catheter.

## 2011-11-16 NOTE — Consult Note (Signed)
WOC consult Note Reason for Consult:right lateral foot, right foot second digit Wound type: traumatic (toe rubbing), ruptured blister on right lateral foot. Pressure Ulcer POA: No Measurement:right lateral foot measures .2 x .4 x .2cm partial thickness tissue loss; right 2nd toe is with .4 x .4 x .2cm open area.  Patient states his toe "sticks out" gets bumped. Wound [id number], pink, moist Drainage (amount, consistency, odor) scant, serous Periwound:intact.  Right toe with erythema Dressing procedure/placement/frequency:NS to toe, change twice daily.  Soft silicone foam to right lateral foot.  Bilateral Prevalon boots for prevention. I will not follow.  Please re-consult if needed. Thanks, Maudie Flakes, MSN, RN, [facility] [place], [place] [phone])

## 2011-11-17 ENCOUNTER — Other Ambulatory Visit (HOSPITAL_COMMUNITY): Payer: Medicare Other

## 2011-11-17 ENCOUNTER — Other Ambulatory Visit: Payer: Self-pay

## 2011-11-17 LAB — CARDIAC PANEL(CRET KIN+CKTOT+MB+TROPI)
CK, MB: 3.5 ng/mL (ref 0.3–4.0)
CK, MB: 3.1 ng/mL (ref 0.3–4.0)
Relative Index: 2.5 (ref 0.0–2.5)
Total CK: 138 U/L (ref 7–232)
Total CK: 122 U/L (ref 7–232)

## 2011-11-17 LAB — MRSA PCR SCREENING: MRSA by PCR: NEGATIVE

## 2011-11-17 LAB — BASIC METABOLIC PANEL
CO2: 24 mEq/L (ref 19–32)
Chloride: 102 mEq/L (ref 96–112)
Potassium: 3.6 mEq/L (ref 3.5–5.1)
Sodium: 135 mEq/L (ref 135–145)

## 2011-11-17 LAB — GLUCOSE, CAPILLARY
Glucose-Capillary: 137 mg/dL — ABNORMAL HIGH (ref 70–99)
Glucose-Capillary: 220 mg/dL — ABNORMAL HIGH (ref 70–99)

## 2011-11-17 NOTE — Progress Notes (Signed)
Subjective: Had 9/10 chest pain this morning lasting for 10-20 minutes.  Resolved with nitro SL.  Now with slight tightness.  No further vomiting.  Diarrhea slowing down. No abd pain. Tolerating clears.  Objective: Vital signs in last 24 hours: Temp:  [98.5 F (36.9 C)-98.9 F (37.2 C)] 98.5 F (36.9 C) (03/09 0644) Pulse Rate:  [71-82] 74  (03/09 1118) Resp:  [20-30] 30  (03/09 0821) BP: (95-127)/(58-78) 127/58 mmHg (03/09 1118) SpO2:  [95 %-99 %] 97 % (03/09 1118) Weight change:  Last BM Date: 11/16/11  Intake/Output from previous day: 03/08 0701 - 03/09 0700 In: 1570 [P.O.:700; I.V.:870] Out: 2275 [Urine:1775; Stool:500] Intake/Output this shift: Total I/O In: -  Out: 275 [Urine:275]  General appearance: alert, cooperative, no distress and morbidly obese Resp: clear to auscultation bilaterally Cardio: regular rate and rhythm, S1, S2 normal, no murmur, click, rub or gallop GI: hyperactive bs, soft, non tender, no mass, no gaurding Extremities: extremities normal, atraumatic, no cyanosis or edema  Lab Results:  Basename 11/16/11 0356 11/15/11 1547  WBC 6.4 8.7  HGB 13.3 14.9  HCT 39.2 43.0  PLT 167 255   BMET  Basename 11/17/11 0450 11/16/11 0356  NA 135 131*  K 3.6 3.6  CL 102 100  CO2 24 24  GLUCOSE 189* 233*  BUN 6 10  CREATININE 0.68 0.62  CALCIUM 7.6* 7.8*    Studies/Results: No results found.  Medications:  I have reviewed the patient's current medications. Scheduled:   . aspirin EC  81 mg Oral Daily  . clopidogrel  75 mg Oral Q breakfast  . heparin  5,000 Units Subcutaneous Q8H  . insulin aspart  0-20 Units Subcutaneous TID WC  . insulin glargine  40 Units Subcutaneous QHS  . isosorbide mononitrate  30 mg Oral Daily  . metoprolol tartrate  50 mg Oral BID  . oxybutynin  5 mg Oral TID  . pantoprazole  40 mg Oral Q1200  . ramipril  5 mg Oral Daily   Continuous:   . sodium chloride 125 mL/hr at 11/15/11 1808  . sodium chloride 75 mL/hr at  11/17/11 [id number]   [id number], acetaminophen, diphenoxylate-atropine, nitroGLYCERIN, ondansetron (ZOFRAN) IV, ondansetron  Assessment/Plan: 1)  Acute gastroenteritis: improving. C. Diff negative. norovirus pcr pending. Taking clears. Still with diarrhea and rectal tube. 2) chest pain: transferred to SDU for concern of angina. Symptoms have resolved at this time.  CE's negative so far. No significant change on EKG.  Continue to monitor on tele. 3)  DM (diabetes mellitus) with complications: blood sugars have been up.  Continue with lantus and ssi.  Will not be too aggressive as he is not eating at this time. 4) Hypertension: controled  5) Hyponatremia: resolved 6)  CAD (coronary artery disease): continue to cycle enzymes and monitor for recurrence of pain.    LOS: 2 days   Anselma Herbel 11/17/2011, 12:01 PM

## 2011-11-17 NOTE — Significant Event (Signed)
Rapid Response Event Note  Overview: Time Called: 0818 Arrival Time: 0820 Event Type: Cardiac  Initial Focused Assessment:   Interventions:   Event Summary:   at   Called to rm [id number] for pt having chest pain. Pt is pale and diaphoretic, c/o pressure midsternal, with some numbness in rt hand. (states he has had a previous stroke and has numbness in that hand).  Pt was given 2 NTG SL by bedside RN,  EKG done.  Pt states pressure is subsiding, also c/o nausea.  MD called will transfer to SD bed.  Difficult to get clear EKG pattern due to pt being diaphoretic.  Lab work ordered.  Pt transferred to rm [id number] at [place].  States he is feeling better.  BP 125/95  HR 78 R 30.   at          [place]

## 2011-11-18 LAB — CBC
Hemoglobin: 12.5 g/dL — ABNORMAL LOW (ref 13.0–17.0)
MCH: 29.7 pg (ref 26.0–34.0)
MCHC: 34.2 g/dL (ref 30.0–36.0)
MCV: 86.9 fL (ref 78.0–100.0)
RBC: 4.21 MIL/uL — ABNORMAL LOW (ref 4.22–5.81)

## 2011-11-18 LAB — BASIC METABOLIC PANEL
BUN: 5 mg/dL — ABNORMAL LOW (ref 6–23)
CO2: 25 mEq/L (ref 19–32)
Calcium: 7.7 mg/dL — ABNORMAL LOW (ref 8.4–10.5)
Creatinine, Ser: 0.64 mg/dL (ref 0.50–1.35)
GFR calc non Af Amer: >90 mL/min (ref >90)
Glucose, Bld: 202 mg/dL — ABNORMAL HIGH (ref 70–99)
Sodium: 135 mEq/L (ref 135–145)

## 2011-11-18 LAB — GLUCOSE, CAPILLARY
Glucose-Capillary: 183 mg/dL — ABNORMAL HIGH (ref 70–99)
Glucose-Capillary: 205 mg/dL — ABNORMAL HIGH (ref 70–99)
Glucose-Capillary: 188 mg/dL — ABNORMAL HIGH (ref 70–99)

## 2011-11-18 LAB — CULTURE, BLOOD (ROUTINE X 2)
Culture  Setup Time: 201303041350
Culture: NO GROWTH

## 2011-11-18 LAB — CARDIAC PANEL(CRET KIN+CKTOT+MB+TROPI)
Relative Index: 2.5 (ref 0.0–2.5)
Total CK: 112 U/L (ref 7–232)

## 2011-11-18 MED ORDER — INSULIN GLARGINE 100 UNIT/ML ~~LOC~~ SOLN
45.0000 [IU] | Freq: Every day | SUBCUTANEOUS | Status: DC
  Administered 2011-11-18: 45 [IU] via SUBCUTANEOUS
  Filled 2011-11-18: qty 3

## 2011-11-18 MED ORDER — POTASSIUM CHLORIDE CRYS ER 20 MEQ PO TBCR
40.0000 meq | EXTENDED_RELEASE_TABLET | Freq: Once | ORAL | Status: AC
  Administered 2011-11-18: 40 meq via ORAL
  Filled 2011-11-18: qty 2

## 2011-11-18 MED ORDER — POTASSIUM CHLORIDE 20 MEQ/15ML (10%) PO LIQD
40.0000 meq | Freq: Two times a day (BID) | ORAL | Status: AC
  Administered 2011-11-18 (×2): 40 meq via ORAL
  Filled 2011-11-18 (×2): qty 30

## 2011-11-18 MED ORDER — TRAMADOL HCL 50 MG PO TABS
50.0000 mg | ORAL_TABLET | Freq: Four times a day (QID) | ORAL | Status: DC | PRN
  Administered 2011-11-18 – 2011-11-20 (×4): 50 mg via ORAL
  Filled 2011-11-18 (×4): qty 1

## 2011-11-18 NOTE — Progress Notes (Signed)
Subjective: Did well overnight. Still with diarrhea. Tolerating clears and feels ready to advance diet. No further chest pain.  Objective: Vital signs in last 24 hours: Temp:  [97.7 F (36.5 C)-98.7 F (37.1 C)] 98.6 F (37 C) (03/10 0400) Pulse Rate:  [64-76] 64  (03/10 0400) Resp:  [14-30] 22  (03/10 0400) BP: (93-127)/(36-67) 126/53 mmHg (03/10 0313) SpO2:  [94 %-99 %] 94 % (03/10 0400) Weight:  [178 kg (392 lb 6.7 oz)] 178 kg (392 lb 6.7 oz) (03/09 0850) Weight change:  Last BM Date: 11/17/11  Intake/Output from previous day: 03/09 0701 - 03/10 0700 In: 2479 [P.O.:975; I.V.:1500; IV Piggyback:4] Out: 3650 [Urine:3650] Intake/Output this shift: Total I/O In: 750 [I.V.:750] Out: 2400 [Urine:2400]  General appearance: alert, cooperative, no distress and severe distress Resp: clear to auscultation bilaterally Cardio: regular rate and rhythm, S1, S2 normal, no murmur, click, rub or gallop GI: soft, bs+, slight tenderness diffusly, no gaurding Extremities: venous stasis changes, no edema, +1 pulses, right third toe lesion is dry non erythematous  Lab Results:  Basename 11/18/11 0005 11/16/11 0356  WBC 5.5 6.4  HGB 12.5* 13.3  HCT 36.6* 39.2  PLT 181 167   BMET  Basename 11/18/11 0005 11/17/11 0450  NA 135 135  K 3.2* 3.6  CL 104 102  CO2 25 24  GLUCOSE 202* 189*  BUN 5* 6  CREATININE 0.64 0.68  CALCIUM 7.7* 7.6*    Studies/Results: No results found.  Medications:  I have reviewed the patient's current medications. Continuous:   . sodium chloride 125 mL/hr at 11/15/11 1808  . sodium chloride 75 mL (11/17/11 2256)   [id number], acetaminophen, diphenoxylate-atropine, nitroGLYCERIN, ondansetron (ZOFRAN) IV, ondansetron Anti-infectives    None      Assessment/Plan: 1) Acute gastroenteritis: improving. C. Diff negative. norovirus pcr pending. Ready to advance diet. Still with rectal tube and diarrhea. 2) chest pain: cardiac resolved. enzymes  negative x 3. Transfer to tele bed today 3) DM (diabetes mellitus) with complications: blood sugars have been up. Continue with lantus and ssi, increase lantus today. Will not be too aggressive as he is not eating at this time.  4) Hypertension: controled  5) Hyponatremia: resolved  6) CAD (coronary artery disease): stable. Chest pain resolved. 7) hypokalemia: repleat and check mg     LOS: 3 days   [name] 11/18/2011, 6:41 AM

## 2011-11-18 NOTE — Progress Notes (Signed)
Patient refusing peripheral IV access. Dr. Volanda Napoleon aware.

## 2011-11-19 LAB — BASIC METABOLIC PANEL
Calcium: 8.1 mg/dL — ABNORMAL LOW (ref 8.4–10.5)
GFR calc Af Amer: >90 mL/min (ref >90)
GFR calc non Af Amer: >90 mL/min (ref >90)
Glucose, Bld: 184 mg/dL — ABNORMAL HIGH (ref 70–99)
Potassium: 4 mEq/L (ref 3.5–5.1)
Sodium: 136 mEq/L (ref 135–145)

## 2011-11-19 LAB — GLUCOSE, CAPILLARY
Glucose-Capillary: 165 mg/dL — ABNORMAL HIGH (ref 70–99)
Glucose-Capillary: 177 mg/dL — ABNORMAL HIGH (ref 70–99)
Glucose-Capillary: 185 mg/dL — ABNORMAL HIGH (ref 70–99)

## 2011-11-19 LAB — CBC
Hemoglobin: 13.2 g/dL (ref 13.0–17.0)
MCH: 29.6 pg (ref 26.0–34.0)
MCHC: 33.9 g/dL (ref 30.0–36.0)
Platelets: 189 10*3/uL (ref 150–400)
RDW: 13.6 % (ref 11.5–15.5)

## 2011-11-19 MED ORDER — LORAZEPAM 2 MG/ML IJ SOLN: 1.0000 mg | Freq: Three times a day (TID) | INTRAMUSCULAR | Status: DC | PRN

## 2011-11-19 MED ORDER — INSULIN GLARGINE 100 UNIT/ML ~~LOC~~ SOLN
60.0000 [IU] | Freq: Every day | SUBCUTANEOUS | Status: DC
  Administered 2011-11-19: 60 [IU] via SUBCUTANEOUS
  Filled 2011-11-19: qty 3

## 2011-11-19 MED ORDER — DIPHENHYDRAMINE HCL 25 MG PO CAPS
25.0000 mg | ORAL_CAPSULE | Freq: Once | ORAL | Status: AC
  Administered 2011-11-19: 25 mg via ORAL
  Filled 2011-11-19: qty 1

## 2011-11-19 NOTE — Progress Notes (Signed)
Pt c/o feeling hot.  Temp checked 98.0, adjusted thermostat suitable for pt.  Pt wanted CBG taken, CBG 177.  159/75, 85. Stated he might be having an allergic reaction to a medicine. MD on call notified.  Order received for Benadryl 25mg  po x1.  Pt appearing frustrated and upset, requesting another Therapist, sports.  Will report to RN. Wendee Beavers [place]

## 2011-11-19 NOTE — Progress Notes (Signed)
Subjective: Still some nausea, diarrhea improved  Objective: Vital signs in last 24 hours: Temp:  [97.7 F (36.5 C)-98.4 F (36.9 C)] 98 F (36.7 C) (03/10 2338) Pulse Rate:  [60-85] 85  (03/11 0028) Resp:  [18-20] 18  (03/10 1400) BP: (116-159)/(59-75) 159/75 mmHg (03/11 0028) SpO2:  [96 %-98 %] 96 % (03/10 2113) Weight change:  Last BM Date: 11/18/11  Intake/Output from previous day: 03/10 0701 - 03/11 0700 In: 840 [P.O.:840] Out: 1150 [Urine:940; Stool:210] Intake/Output this shift: Total I/O In: 120 [P.O.:120] Out: 460 [Urine:300; Stool:160]  General appearance: alert and cooperative Resp: clear to auscultation bilaterally Cardio: regular rate and rhythm, S1, S2 normal, no murmur, click, rub or gallop GI: soft, non-tender; bowel sounds normal; no masses,  no organomegaly  Lab Results:  Basename 11/19/11 0455 11/18/11 0005  WBC 7.6 5.5  HGB 13.2 12.5*  HCT 38.9* 36.6*  PLT 189 181   BMET  Basename 11/19/11 0455 11/18/11 0005  NA 136 135  K 4.0 3.2*  CL 103 104  CO2 25 25  GLUCOSE 184* 202*  BUN 8 5*  CREATININE 0.65 0.64  CALCIUM 8.1* 7.7*    Studies/Results: No results found.  Medications: I have reviewed the patient's current medications.  Assessment/Plan: Principal Problem:  *Acute gastroenteritis  Improving.  D/C rectal tube and ambulate Active Problems:  DM (diabetes mellitus) with complications  Better control  Hypertension  Hyponatremia resolved  CAD (coronary artery disease) no further chest pain, medical management  Disposition hope to discharge in am   LOS: 4 days   Howard Bunte JOSEPH 11/19/2011, 6:29 AM

## 2011-11-19 NOTE — Progress Notes (Signed)
PT NOTE-- pt reports he does not need PT , states he is getting up w/ no difficulty. RN confirmed. PT will sign off.  Pt does request a bariatric bedside commode. MD please order. RN aware.

## 2011-11-20 MED ORDER — TRAMADOL HCL 50 MG PO TABS: 50.0000 mg | ORAL_TABLET | Freq: Four times a day (QID) | ORAL | Status: AC | PRN

## 2011-11-20 NOTE — Discharge Summary (Signed)
Physician Discharge Summary  Patient ID: [name] MRN: [id number] DOB/AGE: 04/19/1946 66 y.o.  Admit date: 11/15/2011 Discharge date: 11/20/2011  Admission Diagnoses:Acute Gastroenteritis Chest Pain Diabetes Mellitus  Discharge Diagnoses:  Principal Problem:  *Acute gastroenteritis Active Problems:  DM (diabetes mellitus) with complications  Dehydration  Hypertension  Hyponatremia  CAD (coronary artery disease)   Discharged Condition: good  Hospital Course: Admitted with acute nausea and vomiting and diarrhea.  Dxed with acute gastroenteritis.  Treated suppotively and gradually improved.  One episode of severe chest pain with nonacute EKG and negative cardiac enzymes.  Sugars under reasonable control at discharge. Ambulated without trouble, declined PT  Consults: None  Significant Diagnostic Studies: labs: per chart  Treatments: IV hydration  Discharge Exam: Blood pressure 144/75, pulse 59, temperature 98.2 F (36.8 C), temperature source Oral, resp. rate 19, height 6\' 1"  (1.854 m), weight 178 kg (392 lb 6.7 oz), SpO2 98.00%. Lungs clear,  Abdomen nontender  Disposition: Home-Health Care Svc   Medication List  As of 11/20/2011  6:52 AM   TAKE these medications         aspirin 81 MG tablet   Take 1 tablet (81 mg total) by mouth daily.      clopidogrel 75 MG tablet   Commonly known as: PLAVIX   Take 1 tablet (75 mg total) by mouth daily with breakfast.      diphenhydrAMINE 25 mg capsule   Commonly known as: BENADRYL   Take 25 mg by mouth every 6 (six) hours as needed. For allergy.      diphenoxylate-atropine 2.5-0.025 MG per tablet   Commonly known as: LOMOTIL   Take 2 tablets by mouth 4 (four) times daily as needed. Diarrhea      insulin NPH-insulin regular (70-30) 100 UNIT/ML injection   Commonly known as: NOVOLIN 70/30   Inject 55 Units into the skin 2 (two) times daily with a meal.      isosorbide mononitrate 30 MG 24 hr tablet   Commonly known as:  IMDUR   Take 1 tablet (30 mg total) by mouth daily.      Melatonin 3 MG Caps   Take 3 mg by mouth at bedtime as needed. Insomnia      metoprolol 50 MG tablet   Commonly known as: LOPRESSOR   Take 1 tablet (50 mg total) by mouth 2 (two) times daily.      nitroGLYCERIN 0.4 MG SL tablet   Commonly known as: NITROSTAT   Place 1 tablet (0.4 mg total) under the tongue every 5 (five) minutes x 3 doses as needed for chest pain.      omeprazole 20 MG capsule   Commonly known as: PRILOSEC   Take 20 mg by mouth daily.      oxybutynin 5 MG tablet   Commonly known as: DITROPAN   Take 5 mg by mouth 3 (three) times daily.      ramipril 5 MG capsule   Commonly known as: ALTACE   Take 1 capsule (5 mg total) by mouth daily.      traMADol 50 MG tablet   Commonly known as: ULTRAM   Take 1 tablet (50 mg total) by mouth every 6 (six) hours as needed (knee/hip  pain).           Follow-up Information    Follow up with Irven Shelling, MD in 10 days.         SignedIrven Shelling 11/20/2011, 6:52 AM

## 2012-03-10 ENCOUNTER — Encounter (HOSPITAL_COMMUNITY): Payer: Self-pay | Admitting: Emergency Medicine

## 2012-03-10 ENCOUNTER — Emergency Department (HOSPITAL_COMMUNITY): Payer: Medicare Other

## 2012-03-10 ENCOUNTER — Inpatient Hospital Stay (HOSPITAL_COMMUNITY)
Admission: EM | Admit: 2012-03-10 | Discharge: 2012-03-12 | DRG: 392 | Disposition: A | Discharge status: Home / Self Care | Payer: Medicare Other | Attending: Internal Medicine | Admitting: Internal Medicine

## 2012-03-10 DIAGNOSIS — E118 Type 2 diabetes mellitus with unspecified complications: Secondary | ICD-10-CM

## 2012-03-10 DIAGNOSIS — I872 Venous insufficiency (chronic) (peripheral): Secondary | ICD-10-CM | POA: Diagnosis present

## 2012-03-10 DIAGNOSIS — E876 Hypokalemia: Secondary | ICD-10-CM | POA: Diagnosis present

## 2012-03-10 DIAGNOSIS — Z794 Long term (current) use of insulin: Secondary | ICD-10-CM

## 2012-03-10 DIAGNOSIS — R301 Vesical tenesmus: Secondary | ICD-10-CM

## 2012-03-10 DIAGNOSIS — I69959 Hemiplegia and hemiparesis following unspecified cerebrovascular disease affecting unspecified side: Secondary | ICD-10-CM

## 2012-03-10 DIAGNOSIS — Z96659 Presence of unspecified artificial knee joint: Secondary | ICD-10-CM

## 2012-03-10 DIAGNOSIS — E0800 Diabetes mellitus due to underlying condition with hyperosmolarity without nonketotic hyperglycemic-hyperosmolar coma (NKHHC): Secondary | ICD-10-CM | POA: Diagnosis present

## 2012-03-10 DIAGNOSIS — N400 Enlarged prostate without lower urinary tract symptoms: Secondary | ICD-10-CM | POA: Diagnosis present

## 2012-03-10 DIAGNOSIS — R1031 Right lower quadrant pain: Secondary | ICD-10-CM | POA: Diagnosis present

## 2012-03-10 DIAGNOSIS — Z79899 Other long term (current) drug therapy: Secondary | ICD-10-CM

## 2012-03-10 DIAGNOSIS — I10 Essential (primary) hypertension: Secondary | ICD-10-CM

## 2012-03-10 DIAGNOSIS — R3989 Other symptoms and signs involving the genitourinary system: Secondary | ICD-10-CM

## 2012-03-10 DIAGNOSIS — K5900 Constipation, unspecified: Secondary | ICD-10-CM | POA: Diagnosis present

## 2012-03-10 DIAGNOSIS — E1142 Type 2 diabetes mellitus with diabetic polyneuropathy: Secondary | ICD-10-CM | POA: Diagnosis present

## 2012-03-10 DIAGNOSIS — Z6841 Body Mass Index (BMI) 40.0 and over, adult: Secondary | ICD-10-CM

## 2012-03-10 DIAGNOSIS — R209 Unspecified disturbances of skin sensation: Secondary | ICD-10-CM | POA: Diagnosis present

## 2012-03-10 DIAGNOSIS — I2510 Atherosclerotic heart disease of native coronary artery without angina pectoris: Secondary | ICD-10-CM

## 2012-03-10 DIAGNOSIS — E1149 Type 2 diabetes mellitus with other diabetic neurological complication: Secondary | ICD-10-CM | POA: Diagnosis present

## 2012-03-10 DIAGNOSIS — I639 Cerebral infarction, unspecified: Secondary | ICD-10-CM

## 2012-03-10 DIAGNOSIS — N390 Urinary tract infection, site not specified: Secondary | ICD-10-CM

## 2012-03-10 DIAGNOSIS — R202 Paresthesia of skin: Secondary | ICD-10-CM | POA: Diagnosis present

## 2012-03-10 LAB — CBC WITH DIFFERENTIAL/PLATELET
Basophils Absolute: 0 10*3/uL (ref 0.0–0.1)
Eosinophils Relative: 1 % (ref 0–5)
HCT: 39 % (ref 39.0–52.0)
Hemoglobin: 13.6 g/dL (ref 13.0–17.0)
Lymphocytes Relative: 16 % (ref 12–46)
MCV: 85.2 fL (ref 78.0–100.0)
Monocytes Absolute: 0.9 10*3/uL (ref 0.1–1.0)
Monocytes Relative: 8 % (ref 3–12)
Neutro Abs: 8 10*3/uL — ABNORMAL HIGH (ref 1.7–7.7)
RDW: 13.3 % (ref 11.5–15.5)
WBC: 10.7 10*3/uL — ABNORMAL HIGH (ref 4.0–10.5)

## 2012-03-10 LAB — GLUCOSE, CAPILLARY: Glucose-Capillary: 272 mg/dL — ABNORMAL HIGH (ref 70–99)

## 2012-03-10 LAB — BASIC METABOLIC PANEL
BUN: 6 mg/dL (ref 6–23)
CO2: 25 mEq/L (ref 19–32)
Chloride: 95 mEq/L — ABNORMAL LOW (ref 96–112)
Creatinine, Ser: 0.55 mg/dL (ref 0.50–1.35)
GFR calc Af Amer: >90 mL/min (ref >90)
Potassium: 3.1 mEq/L — ABNORMAL LOW (ref 3.5–5.1)

## 2012-03-10 NOTE — ED Provider Notes (Signed)
History     CSN: [id number]  Arrival date & time 03/10/12  1945   First MD Initiated Contact with Patient 03/10/12 2314      Chief Complaint  Patient presents with  . Constipation    (Consider location/radiation/quality/duration/timing/severity/associated sxs/prior treatment) The history is provided by the patient and a relative.   Patient here with abdominal bloating constipation x2 days. Describes crampy diffuse abdominal pain without fever or vomiting. Notes some loose stools and that he does have chronic constipation. Patient also complains of whole-body weakness worse on his right side. History of CVA with resultant right-sided deficits. Patient does fill near syncopal. History of similar symptoms associated with sepsis and denies any recent cough or congestion. Denies any urinary symptoms. Past Medical History  Diagnosis Date  . Obese   . Hypertension   . Diabetes mellitus   . Diabetic diarrhea   . Sleep apnea   . Diabetic neuropathy, painful   . Chronic indwelling foley catheter   . Bladder spasms   . Complication of anesthesia   . PONV (postoperative nausea and vomiting)     Past Surgical History  Procedure Date  . Joint replacement     Left Total Knee  . Gastroplasty   . Cholecystectomy     History reviewed. No pertinent family history.  History  Substance Use Topics  . Smoking status: Former Smoker -- 2.0 packs/day  . Smokeless tobacco: Never Used  . Alcohol Use: No      Review of Systems  All other systems reviewed and are negative.    Allergies  Ace inhibitors; Cefadroxil; Cephalexin; Lipitor; Metformin and related; Methocarbamol; and Morphine and related  Home Medications   Current Outpatient Rx  Name Route Sig Dispense Refill  . ASPIRIN EC 81 MG PO TBEC Oral Take 81 mg by mouth daily.    Marland Kitchen BISACODYL 5 MG PO TBEC Oral Take 10 mg by mouth once.    . CLOPIDOGREL BISULFATE 75 MG PO TABS Oral Take 1 tablet (75 mg total) by mouth daily with  breakfast. 30 tablet 11  . DIPHENHYDRAMINE HCL 25 MG PO CAPS Oral Take 25 mg by mouth every 6 (six) hours as needed. For allergy.    Marland Kitchen DIPHENOXYLATE-ATROPINE 2.5-0.025 MG PO TABS Oral Take 1 tablet by mouth 4 (four) times daily as needed. Diarrhea    . FUROSEMIDE 40 MG PO TABS Oral Take 40 mg by mouth daily.    . INSULIN ISOPHANE & REGULAR (70-30) 100 UNIT/ML [place] SUSP Subcutaneous Inject 55 Units into the skin 2 (two) times daily with a meal.    . ISOSORBIDE MONONITRATE ER 30 MG PO TB24 Oral Take 1 tablet (30 mg total) by mouth daily. 30 tablet 11  . METOPROLOL TARTRATE 50 MG PO TABS Oral Take 1 tablet (50 mg total) by mouth 2 (two) times daily. 60 tablet 11  . NITROGLYCERIN 0.4 MG SL SUBL Sublingual Place 1 tablet (0.4 mg total) under the tongue every 5 (five) minutes x 3 doses as needed for chest pain. 1 tablet 1  . OMEPRAZOLE 20 MG PO CPDR Oral Take 20 mg by mouth daily.    . OXYBUTYNIN CHLORIDE 5 MG PO TABS Oral Take 5 mg by mouth 2 (two) times daily as needed. For bladder pain.    Marland Kitchen RAMIPRIL 5 MG PO CAPS Oral Take 1 capsule (5 mg total) by mouth daily. 30 capsule 11  . TRAMADOL HCL 50 MG PO TABS Oral Take 50 mg by mouth every 8 (  eight) hours as needed. For pain.      Temp 99.4 F (37.4 C) (Rectal)  Physical Exam  Nursing note and vitals reviewed. Constitutional: He is oriented to person, place, and time. He appears well-developed and well-nourished.  Non-toxic appearance. No distress.  HENT:  Head: Normocephalic and atraumatic.  Eyes: Conjunctivae, EOM and lids are normal. Pupils are equal, round, and reactive to light.  Neck: Normal range of motion. Neck supple. No tracheal deviation present. No mass present.  Cardiovascular: Normal rate, regular rhythm and normal heart sounds.  Exam reveals no gallop.   No murmur heard. Pulmonary/Chest: Effort normal and breath sounds normal. No stridor. No respiratory distress. He has no decreased breath sounds. He has no wheezes. He has no rhonchi.  He has no rales.  Abdominal: Soft. Normal appearance and bowel sounds are normal. He exhibits no distension. There is generalized tenderness. There is no rigidity, no rebound, no guarding and no CVA tenderness.  Musculoskeletal: Normal range of motion. He exhibits no edema and no tenderness.  Neurological: He is alert and oriented to person, place, and time. No cranial nerve deficit or sensory deficit. GCS eye subscore is 4. GCS verbal subscore is 5. GCS motor subscore is 6.       Right ue strength 4/5  Skin: Skin is warm and dry. No abrasion and no rash noted.  Psychiatric: He has a normal mood and affect. His speech is normal and behavior is normal.    ED Course  Procedures (including critical care time)  Labs Reviewed  BASIC METABOLIC PANEL - Abnormal; Notable for the following:    Sodium 132 (*)     Potassium 3.1 (*)     Chloride 95 (*)     Glucose, Bld 297 (*)     Calcium 8.3 (*)     All other components within normal limits  CBC WITH DIFFERENTIAL - Abnormal; Notable for the following:    WBC 10.7 (*)     Neutro Abs 8.0 (*)     All other components within normal limits  GLUCOSE, CAPILLARY - Abnormal; Notable for the following:    Glucose-Capillary 272 (*)     All other components within normal limits  URINALYSIS, ROUTINE W REFLEX MICROSCOPIC  URINE CULTURE  LACTIC ACID, PLASMA  HEPATIC FUNCTION PANEL   No results found.   No diagnosis found.    MDM   Date: 03/10/2012  Rate: 82  Rhythm: normal sinus rhythm  QRS Axis: normal  Intervals: normal  ST/T Wave abnormalities: nonspecific ST changes  Conduction Disutrbances:first-degree A-V block   Narrative Interpretation:   Old EKG Reviewed: unchanged  2:39 AM Patient treated with IV ciprofloxacin for his urinary tract infection. His potassium was replaced here. No concern for a stroke at this time. Will admit to hospitalist        Leota Jacobsen, MD 03/11/12 [phone]

## 2012-03-10 NOTE — ED Notes (Signed)
Patient states that he has neuropathy of the bowel and usually has runny stools. For the last 2 days he has been constipated and the patient states "that he feels like his anus is not going to open". The patient has strained so much tha the is having abdominal pain '

## 2012-03-11 ENCOUNTER — Inpatient Hospital Stay (HOSPITAL_COMMUNITY): Payer: Medicare Other

## 2012-03-11 ENCOUNTER — Encounter (HOSPITAL_COMMUNITY): Payer: Self-pay | Admitting: Internal Medicine

## 2012-03-11 DIAGNOSIS — R3989 Other symptoms and signs involving the genitourinary system: Secondary | ICD-10-CM

## 2012-03-11 DIAGNOSIS — E876 Hypokalemia: Secondary | ICD-10-CM | POA: Diagnosis present

## 2012-03-11 DIAGNOSIS — I2510 Atherosclerotic heart disease of native coronary artery without angina pectoris: Secondary | ICD-10-CM

## 2012-03-11 DIAGNOSIS — I6350 Cerebral infarction due to unspecified occlusion or stenosis of unspecified cerebral artery: Secondary | ICD-10-CM

## 2012-03-11 DIAGNOSIS — K5900 Constipation, unspecified: Secondary | ICD-10-CM | POA: Diagnosis present

## 2012-03-11 DIAGNOSIS — N390 Urinary tract infection, site not specified: Secondary | ICD-10-CM

## 2012-03-11 DIAGNOSIS — I639 Cerebral infarction, unspecified: Secondary | ICD-10-CM | POA: Diagnosis present

## 2012-03-11 DIAGNOSIS — R1031 Right lower quadrant pain: Secondary | ICD-10-CM | POA: Diagnosis present

## 2012-03-11 DIAGNOSIS — E118 Type 2 diabetes mellitus with unspecified complications: Secondary | ICD-10-CM

## 2012-03-11 DIAGNOSIS — I10 Essential (primary) hypertension: Secondary | ICD-10-CM

## 2012-03-11 LAB — BASIC METABOLIC PANEL
BUN: 5 mg/dL — ABNORMAL LOW (ref 6–23)
CO2: 24 mEq/L (ref 19–32)
Chloride: 98 mEq/L (ref 96–112)
GFR calc non Af Amer: >90 mL/min (ref >90)
Glucose, Bld: 210 mg/dL — ABNORMAL HIGH (ref 70–99)
Potassium: 3.7 mEq/L (ref 3.5–5.1)

## 2012-03-11 LAB — HEMOGLOBIN A1C: Mean Plasma Glucose: 223 mg/dL — ABNORMAL HIGH (ref <117)

## 2012-03-11 LAB — URINALYSIS, ROUTINE W REFLEX MICROSCOPIC
Bilirubin Urine: NEGATIVE
Glucose, UA: 250 mg/dL — AB
Ketones, ur: NEGATIVE mg/dL
Nitrite: NEGATIVE
Specific Gravity, Urine: 1.004 — ABNORMAL LOW (ref 1.005–1.030)
pH: 7 (ref 5.0–8.0)

## 2012-03-11 LAB — URINE MICROSCOPIC-ADD ON

## 2012-03-11 LAB — HEPATIC FUNCTION PANEL
Indirect Bilirubin: 0.6 mg/dL (ref 0.3–0.9)
Total Protein: 6.7 g/dL (ref 6.0–8.3)

## 2012-03-11 LAB — LIPID PANEL
Cholesterol: 161 mg/dL (ref 0–200)
Triglycerides: 118 mg/dL (ref <150)
VLDL: 24 mg/dL (ref 0–40)

## 2012-03-11 LAB — POCT I-STAT TROPONIN I

## 2012-03-11 LAB — GLUCOSE, CAPILLARY

## 2012-03-11 MED ORDER — POTASSIUM CHLORIDE 20 MEQ/15ML (10%) PO LIQD
40.0000 meq | Freq: Once | ORAL | Status: AC
  Administered 2012-03-11: 40 meq via ORAL
  Filled 2012-03-11: qty 30

## 2012-03-11 MED ORDER — INSULIN ASPART 100 UNIT/ML ~~LOC~~ SOLN
0.0000 [IU] | SUBCUTANEOUS | Status: DC
  Administered 2012-03-11: 7 [IU] via SUBCUTANEOUS
  Administered 2012-03-11: 11 [IU] via SUBCUTANEOUS
  Administered 2012-03-11: 7 [IU] via SUBCUTANEOUS
  Administered 2012-03-11: 11 [IU] via SUBCUTANEOUS
  Administered 2012-03-12 (×3): 7 [IU] via SUBCUTANEOUS
  Administered 2012-03-12: 11 [IU] via SUBCUTANEOUS

## 2012-03-11 MED ORDER — CIPROFLOXACIN IN D5W 400 MG/200ML IV SOLN
400.0000 mg | Freq: Once | INTRAVENOUS | Status: AC
  Administered 2012-03-11: 400 mg via INTRAVENOUS
  Filled 2012-03-11: qty 200

## 2012-03-11 MED ORDER — FLEET ENEMA 7-19 GM/118ML RE ENEM
1.0000 | ENEMA | Freq: Once | RECTAL | Status: AC
  Administered 2012-03-11: 1 via RECTAL
  Filled 2012-03-11: qty 1

## 2012-03-11 MED ORDER — ENOXAPARIN SODIUM 40 MG/0.4ML ~~LOC~~ SOLN
40.0000 mg | SUBCUTANEOUS | Status: DC
  Administered 2012-03-11 – 2012-03-12 (×2): 40 mg via SUBCUTANEOUS
  Filled 2012-03-11 (×3): qty 0.4

## 2012-03-11 MED ORDER — SENNOSIDES-DOCUSATE SODIUM 8.6-50 MG PO TABS
1.0000 | ORAL_TABLET | Freq: Every evening | ORAL | Status: DC | PRN
  Administered 2012-03-11: 1 via ORAL
  Filled 2012-03-11: qty 1

## 2012-03-11 MED ORDER — ASPIRIN 325 MG PO TABS
325.0000 mg | ORAL_TABLET | Freq: Every day | ORAL | Status: DC
  Administered 2012-03-11 – 2012-03-12 (×2): 325 mg via ORAL
  Filled 2012-03-11 (×2): qty 1

## 2012-03-11 MED ORDER — POTASSIUM CHLORIDE 10 MEQ/100ML IV SOLN
10.0000 meq | INTRAVENOUS | Status: AC
  Administered 2012-03-11: 10 meq via INTRAVENOUS
  Filled 2012-03-11: qty 100

## 2012-03-11 MED ORDER — CIPROFLOXACIN IN D5W 400 MG/200ML IV SOLN
400.0000 mg | Freq: Two times a day (BID) | INTRAVENOUS | Status: DC
  Administered 2012-03-11 – 2012-03-12 (×2): 400 mg via INTRAVENOUS
  Filled 2012-03-11 (×3): qty 200

## 2012-03-11 MED ORDER — BISACODYL 5 MG PO TBEC
10.0000 mg | DELAYED_RELEASE_TABLET | Freq: Every day | ORAL | Status: DC | PRN
  Administered 2012-03-11: 10 mg via ORAL
  Filled 2012-03-11: qty 2

## 2012-03-11 MED ORDER — ASPIRIN 300 MG RE SUPP
300.0000 mg | Freq: Every day | RECTAL | Status: DC
  Filled 2012-03-11 (×2): qty 1

## 2012-03-11 MED ORDER — POTASSIUM CHLORIDE CRYS ER 20 MEQ PO TBCR
40.0000 meq | EXTENDED_RELEASE_TABLET | Freq: Once | ORAL | Status: DC
  Filled 2012-03-11: qty 2

## 2012-03-11 MED ORDER — IOHEXOL 350 MG/ML SOLN
100.0000 mL | Freq: Once | INTRAVENOUS | Status: AC | PRN
  Administered 2012-03-11: 100 mL via INTRAVENOUS

## 2012-03-11 MED ORDER — FENTANYL CITRATE 0.05 MG/ML IJ SOLN
25.0000 ug | INTRAMUSCULAR | Status: DC | PRN
  Administered 2012-03-11 (×2): 25 ug via INTRAVENOUS
  Filled 2012-03-11 (×2): qty 2

## 2012-03-11 NOTE — Progress Notes (Signed)
  Echocardiogram 2D Echocardiogram has been performed.  [place], [place] 03/11/2012, 8:45 AM

## 2012-03-11 NOTE — Progress Notes (Signed)
Inpatient Diabetes Program Recommendations  AACE/ADA: New Consensus Statement on Inpatient Glycemic Control (2009)  Target Ranges:  Prepandial:   less than 140 mg/dL      Peak postprandial:   less than 180 mg/dL (1-2 hours)      Critically ill patients:  140 - 180 mg/dL   Reason for Visit: Hyperglycemia  [name] is an 66 y.o. male who present with multiple complaints first complaint of Headache and right sided weakness X 3-4 hours. He has a hx of a previous CVA with Right Sided weakness, but today he reports initially having numbness in 3 fingers but now having numbness in all of his fingers in the right hand. His second complaint is worsening ABD pain and ABD Distension and constipation with no BM X 3 days. He says he took a laxative without results.   Results for [name], [name] (MRN [id number]) as of 03/11/2012 11:45  Ref. Range 03/10/2012 23:23 03/11/2012 04:42  Glucose-Capillary Latest Range: 70-99 mg/dL 272 (H) 265 (H)   Results for [name], [name] (MRN [id number]) as of 03/11/2012 11:45  Ref. Range 03/10/2012 21:10  Sodium Latest Range: 135-145 mEq/L 132 (L)  Potassium Latest Range: 3.5-5.1 mEq/L 3.1 (L)  Chloride Latest Range: 96-112 mEq/L 95 (L)  CO2 Latest Range: 19-32 mEq/L 25  BUN Latest Range: 6-23 mg/dL 6  Creat Latest Range: 0.50-1.35 mg/dL 0.55  Calcium Latest Range: 8.4-10.5 mg/dL 8.3 (L)  GFR calc non Af Amer Latest Range: >90 mL/min >90  GFR calc Af Amer Latest Range: >90 mL/min >90  Glucose Latest Range: 70-99 mg/dL 297 (H)   Inpatient Diabetes Program Recommendations Insulin - Basal: When diet resumes, 70/30 30 units bid and titrate (Home dose is 70/30 50 units bid) HgbA1C: Need updated HgbA1C to assess glycemic control prior to hospitalization ( Last one on 11/12/2011 - 9.7%) Diet: Change diet to CHO-mod med  Note: [facility] benefit [facility] for [facility] Diabetes Education.  Will follow.

## 2012-03-11 NOTE — Progress Notes (Signed)
I have seen and examined patient, admitted with paresthesias on the right side of face and upper extremities and CVA workup in process. Carotid Doppler as she no evidence of a right internal carotid artery stenosis, left internal carotid artery demonstrates elevated velocities in the 40-59% range with no evidence of significant stenosis. MRI ordered not done due to of patient exceeding weight limit, CTA head ordered, followup on pending echo results, otherwise continuing management plan as per Dr. Arnoldo Morale.  Minette Headland [facility] [phone]

## 2012-03-11 NOTE — Progress Notes (Signed)
CARE MANAGEMENT NOTE 03/11/2012  Patient:  [name], [name]   Account Number:  [id number]  Date Initiated:  03/11/2012  Documentation initiated by:  Dennie Moltz  Subjective/Objective Assessment:   patient with new onset of tingling and numbess in hand and fingers, history of cva in the past     Action/Plan:   lives at home   Anticipated DC Date:  03/14/2012   Anticipated DC Plan:  HOME/SELF CARE  In-house referral  NA      DC Planning Services  NA      [facility] Choice  NA   Choice offered to / List presented to:  NA   DME arranged  NA      DME agency  NA     [place] arranged  NA      [place] agency  NA   Status of service:  In process, will continue to follow Medicare Important Message given?  NA - LOS <3 / Initial given by admissions (If response is "NO", the following Medicare IM given date fields will be blank) Date Medicare IM given:   Date Additional Medicare IM given:    Discharge Disposition:    Per UR Regulation:  Reviewed for med. necessity/level of care/duration of stay  If discussed at [place] of Stay Meetings, dates discussed:    Comments:  [id number]/Hermione Havlicek Rosana Hoes, RN, BSN, CCM Chart and patient information reviewed no discharge needs present at this time. Case Management [phone]

## 2012-03-11 NOTE — Progress Notes (Signed)
Upon discussing PICC placement, pt refused PICC line placement at this time. States "I didn't know it was that involved." Also stated that he was having to go to the bathroom every 30 minutes to stool.

## 2012-03-11 NOTE — H&P (Signed)
DATE OF ADMISSION:  03/11/2012  PCP:    Irven Shelling, MD   Chief Complaint: Right Face and right Sided Weakness   HPI: [name] is an 66 y.o. male who present with multiple complaints first complaint of Headache and right sided weakness X 3-4 hours.  He has a hx of a previous CVA with Right Sided weakness, but today he reports initially having numbness in 3 fingers but now having numbness in all of his fingers in the right hand.  His second complaint is worsening ABD pain and ABD Distension and constipation with no BM X 3 days.  He says he took a laxative without results.    Past Medical History  Diagnosis Date  . Obese   . Hypertension   . Diabetes mellitus   . Diabetic diarrhea   . Sleep apnea   . Diabetic neuropathy, painful   . Chronic indwelling foley catheter   . Bladder spasms   . Complication of anesthesia   . PONV (postoperative nausea and vomiting)     Past Surgical History  Procedure Date  . Right tkr   . Gastroplasty   . Cholecystectomy   . Cardiac catheterization     Medications:  HOME MEDS: Prior to Admission medications   Medication Sig Start Date End Date Taking? Authorizing Provider  aspirin EC 81 MG tablet Take 81 mg by mouth daily.   Yes Historical Provider, MD  bisacodyl (DULCOLAX) 5 MG EC tablet Take 10 mg by mouth once.   Yes Historical Provider, MD  clopidogrel (PLAVIX) 75 MG tablet Take 1 tablet (75 mg total) by mouth daily with breakfast. 10/29/11 10/28/12 Yes Irven Shelling, MD  diphenhydrAMINE (BENADRYL) 25 mg capsule Take 25 mg by mouth every 6 (six) hours as needed. For allergy.   Yes Historical Provider, MD  diphenoxylate-atropine (LOMOTIL) 2.5-0.025 MG per tablet Take 1 tablet by mouth 4 (four) times daily as needed. Diarrhea   Yes Historical Provider, MD  furosemide (LASIX) 40 MG tablet Take 40 mg by mouth daily.   Yes Historical Provider, MD  insulin NPH-insulin regular (NOVOLIN 70/30) (70-30) 100 UNIT/ML injection Inject  55 Units into the skin 2 (two) times daily with a meal.   Yes Historical Provider, MD  isosorbide mononitrate (IMDUR) 30 MG 24 hr tablet Take 1 tablet (30 mg total) by mouth daily. 11/14/11 11/13/12 Yes Irven Shelling, MD  metoprolol tartrate (LOPRESSOR) 50 MG tablet Take 1 tablet (50 mg total) by mouth 2 (two) times daily. 11/14/11  Yes Irven Shelling, MD  nitroGLYCERIN (NITROSTAT) 0.4 MG SL tablet Place 1 tablet (0.4 mg total) under the tongue every 5 (five) minutes x 3 doses as needed for chest pain. 10/29/11 10/28/12 Yes Irven Shelling, MD  omeprazole (PRILOSEC) 20 MG capsule Take 20 mg by mouth daily.   Yes Historical Provider, MD  oxybutynin (DITROPAN) 5 MG tablet Take 5 mg by mouth 2 (two) times daily as needed. For bladder pain.   Yes Historical Provider, MD  ramipril (ALTACE) 5 MG capsule Take 1 capsule (5 mg total) by mouth daily. 11/14/11  Yes Irven Shelling, MD  traMADol (ULTRAM) 50 MG tablet Take 50 mg by mouth every 8 (eight) hours as needed. For pain.   Yes Historical Provider, MD    Allergies:  Allergies  Allergen Reactions  . Ace Inhibitors   . Cefadroxil Hives  . Cephalexin Hives  . Lipitor (Atorvastatin Calcium) Swelling  . Metformin And Related Swelling  . Methocarbamol  Other (See Comments)    Feels like is in "rocky boat"  . Morphine And Related Other (See Comments)    Sweating, feels like is "in rocky boat."    Social History:   reports that he has quit smoking. He has never used smokeless tobacco. He reports that he does not drink alcohol or use illicit drugs.  Family History: Family History  Problem Relation Age of Onset  . Hypertension Mother   . Diabetes type I Father   . Cancer Brother      Testicular Cancer  . Cancer Brother     Leukemia    Review of Systems:  The patient denies anorexia, fever, weight loss, vision loss, decreased hearing, hoarseness, chest pain, syncope, dyspnea on exertion, peripheral edema, balance deficits, hemoptysis,  abdominal pain, melena, hematochezia, severe indigestion/heartburn, hematuria, incontinence, genital sores, muscle weakness, suspicious skin lesions, transient blindness, difficulty walking, depression, unusual weight change, abnormal bleeding, enlarged lymph nodes, angioedema, and breast masses.   Physical Exam:  GEN:  Pleasant 66 year old examined  and in no acute distress; cooperative with exam Filed Vitals:   03/10/12 2327 03/11/12 0254  BP:  150/63  Pulse:  77  Temp: 99.4 F (37.4 C)   TempSrc: Rectal   Resp:  18  SpO2:  99%   Blood pressure 150/63, pulse 77, temperature 99.4 F (37.4 C), temperature source Rectal, resp. rate 18, SpO2 99.00%. PSYCH: He is alert and oriented x4; does not appear anxious does not appear depressed; affect is normal HEENT: Normocephalic and Atraumatic, Mucous membranes pink; PERRLA; EOM intact; Fundi:  Benign;  No scleral icterus, Nares: Patent, Oropharynx: Clear, Edentulous, Neck:  FROM, no cervical lymphadenopathy nor thyromegaly or carotid bruit; no JVD; Breasts:: Not examined CHEST WALL: No tenderness CHEST: Normal respiration, clear to auscultation bilaterally HEART: Regular rate and rhythm; no murmurs rubs or gallops BACK: No kyphosis or scoliosis; no CVA tenderness ABDOMEN: Positive Bowel Sounds, Obese, soft non-tender; no masses, no organomegaly, no pannus; no intertriginous candida. Rectal Exam: Not done EXTREMITIES: No bone or joint deformity; age-appropriate arthropathy of the hands and knees; no cyanosis, clubbing or edema; no ulcerations. Genitalia: not examined PULSES: 2+ and symmetric SKIN: Normal hydration no rash or ulceration CNS: Cranial nerves 2-12, Mild Right Sided Weakness   Labs & Imaging Results for orders placed during the hospital encounter of 03/10/12 (from the past 48 hour(s))  BASIC METABOLIC PANEL     Status: Abnormal   Collection Time   03/10/12  9:10 PM      Component Value Range Comment   Sodium 132 (*) 135 - 145  mEq/L    Potassium 3.1 (*) 3.5 - 5.1 mEq/L    Chloride 95 (*) 96 - 112 mEq/L    CO2 25  19 - 32 mEq/L    Glucose, Bld 297 (*) 70 - 99 mg/dL    BUN 6  6 - 23 mg/dL    Creatinine, Ser 0.55  0.50 - 1.35 mg/dL    Calcium 8.3 (*) 8.4 - 10.5 mg/dL    GFR calc non Af Amer >90  >90 mL/min    GFR calc Af Amer >90  >90 mL/min   CBC WITH DIFFERENTIAL     Status: Abnormal   Collection Time   03/10/12  9:10 PM      Component Value Range Comment   WBC 10.7 (*) 4.0 - 10.5 K/uL    RBC 4.58  4.22 - 5.81 MIL/uL    Hemoglobin 13.6  13.0 -  17.0 g/dL    HCT 39.0  39.0 - 52.0 %    MCV 85.2  78.0 - 100.0 fL    MCH 29.7  26.0 - 34.0 pg    MCHC 34.9  30.0 - 36.0 g/dL    RDW 13.3  11.5 - 15.5 %    Platelets 199  150 - 400 K/uL    Neutrophils Relative 75  43 - 77 %    Neutro Abs 8.0 (*) 1.7 - 7.7 K/uL    Lymphocytes Relative 16  12 - 46 %    Lymphs Abs 1.7  0.7 - 4.0 K/uL    Monocytes Relative 8  3 - 12 %    Monocytes Absolute 0.9  0.1 - 1.0 K/uL    Eosinophils Relative 1  0 - 5 %    Eosinophils Absolute 0.2  0.0 - 0.7 K/uL    Basophils Relative 0  0 - 1 %    Basophils Absolute 0.0  0.0 - 0.1 K/uL   GLUCOSE, CAPILLARY     Status: Abnormal   Collection Time   03/10/12 11:23 PM      Component Value Range Comment   Glucose-Capillary 272 (*) 70 - 99 mg/dL   URINALYSIS, ROUTINE W REFLEX MICROSCOPIC     Status: Abnormal   Collection Time   03/11/12 12:26 AM      Component Value Range Comment   Color, Urine YELLOW  YELLOW    APPearance CLEAR  CLEAR    Specific Gravity, Urine 1.004 (*) 1.005 - 1.030    pH 7.0  5.0 - 8.0    Glucose, UA 250 (*) NEGATIVE mg/dL    Hgb urine dipstick SMALL (*) NEGATIVE    Bilirubin Urine NEGATIVE  NEGATIVE    Ketones, ur NEGATIVE  NEGATIVE mg/dL    Protein, ur NEGATIVE  NEGATIVE mg/dL    Urobilinogen, UA 0.2  0.0 - 1.0 mg/dL    Nitrite NEGATIVE  NEGATIVE    Leukocytes, UA LARGE (*) NEGATIVE   URINE MICROSCOPIC-ADD ON     Status: Abnormal   Collection Time   03/11/12 12:26 AM       Component Value Range Comment   WBC, UA TOO NUMEROUS TO COUNT  <3 WBC/hpf WITH CLUMPS   RBC / HPF 0-2  <3 RBC/hpf    Bacteria, UA FEW (*) RARE   LACTIC ACID, PLASMA     Status: Normal   Collection Time   03/11/12 12:40 AM      Component Value Range Comment   Lactic Acid, Venous 1.4  0.5 - 2.2 mmol/L   HEPATIC FUNCTION PANEL     Status: Normal   Collection Time   03/11/12 12:40 AM      Component Value Range Comment   Total Protein 6.7  6.0 - 8.3 g/dL    Albumin 3.5  3.5 - 5.2 g/dL    AST 17  0 - 37 U/L    ALT 19  0 - 53 U/L    Alkaline Phosphatase 101  39 - 117 U/L    Total Bilirubin 0.7  0.3 - 1.2 mg/dL    Bilirubin, Direct 0.1  0.0 - 0.3 mg/dL    Indirect Bilirubin 0.6  0.3 - 0.9 mg/dL   POCT I-STAT TROPONIN I     Status: Normal   Collection Time   03/11/12 12:53 AM      Component Value Range Comment   Troponin i, poc 0.01  0.00 - 0.08 ng/mL  Comment 3             Dg Chest 2 View  03/11/2012  *RADIOLOGY REPORT*  Clinical Data: Right arm pain.  CHEST - 2 VIEW  Comparison: 11/12/2011  Findings: Cardiomegaly with mild vascular congestion.  No overt edema.  No focal opacities or effusions.  No acute bony abnormality.  IMPRESSION: Cardiomegaly, vascular congestion.  Original Report Authenticated By: Raelyn Number, M.D.   Ct Head Wo Contrast  03/11/2012  *RADIOLOGY REPORT*  Clinical Data: Headache.  CT HEAD WITHOUT CONTRAST  Technique:  Contiguous axial images were obtained from the base of the skull through the vertex without contrast.  Comparison: 10/21/2011  Findings: Stable physiologic calcifications within the basal ganglia and thalami bilaterally. No acute intracranial abnormality. Specifically, no hemorrhage, hydrocephalus, mass lesion, acute infarction, or significant intracranial injury.  No acute calvarial abnormality. Visualized paranasal sinuses and mastoids clear. Orbital soft tissues unremarkable.  IMPRESSION: No acute intracranial abnormality.  Original Report Authenticated  By: Raelyn Number, M.D.   Dg Abd 2 Views  03/11/2012  *RADIOLOGY REPORT*  Clinical Data: Upper abdominal pain.  ABDOMEN - 2 VIEW  Comparison: CT 04/19/2010  Findings: Large stool burden in the colon, particularly the right side of the colon.  No evidence of bowel obstruction or free air. No organomegaly.  Prior cholecystectomy.  No acute bony abnormality.  IMPRESSION: A large stool burden in the right side of the colon.  No obstruction or free air.  Original Report Authenticated By: Raelyn Number, M.D.     EKG:  Sinus rhythm with 1st Degree AVB and Diffuse Artifact     Assessment:  Present on Admission:  .CVA (cerebral infarction) .Abdominal pain, right lower quadrant .UTI (lower urinary tract infection) .DM (diabetes mellitus) with complications .Hypertension .Hypokalemia .Obstipation   Plan:    CVA Workup Pain  Control, Fleets Enema X 1  Urine C+S , IV Cipro SSI coverage Replete DVT Prophylaxis Other plans as per orders.    CODE STATUS:      FULL CODE      Cashawn Yanko C 03/11/2012, 4:03 AM

## 2012-03-11 NOTE — Progress Notes (Addendum)
*  PRELIMINARY RESULTS* Vascular Ultrasound Carotid Duplex (Doppler) has been completed.   No evidence of right internal carotid artery stenosis. There are elevated right external carotid artery velocities, which is suggestive of stenosis. The left internal carotid artery demonstrates elevated velocities in the 40-59% range, however there is no obvious evidence of significant plaque morphology to support stenosis. Bilateral antegrade vertebral artery flow.   03/11/2012 11:07 AM Maudry Mayhew, RDMS, RDCS

## 2012-03-11 NOTE — Progress Notes (Signed)
Pt. Had One run of V-tach.  Alert and oriented/communicating with RN. Pt. Asymptomatic. VS stable. Will continue to monitor. Mayelin Panos, Katherine Roan

## 2012-03-11 NOTE — Progress Notes (Addendum)
Fleets enema produced no results due to patients inability to retain fluid. He states the due to the right sided numbness he was not able to feel or contract muscles to retain enema. Secondly, notification was sent that patient exceeds the weigh limit for MRI. Notification in progress to on call attending.

## 2012-03-12 ENCOUNTER — Inpatient Hospital Stay (HOSPITAL_COMMUNITY): Payer: Medicare Other

## 2012-03-12 DIAGNOSIS — R202 Paresthesia of skin: Secondary | ICD-10-CM | POA: Diagnosis present

## 2012-03-12 LAB — GLUCOSE, CAPILLARY
Glucose-Capillary: 247 mg/dL — ABNORMAL HIGH (ref 70–99)
Glucose-Capillary: 249 mg/dL — ABNORMAL HIGH (ref 70–99)
Glucose-Capillary: 272 mg/dL — ABNORMAL HIGH (ref 70–99)

## 2012-03-12 MED ORDER — CLOPIDOGREL BISULFATE 75 MG PO TABS
75.0000 mg | ORAL_TABLET | Freq: Every day | ORAL | Status: DC
  Administered 2012-03-12: 75 mg via ORAL
  Filled 2012-03-12 (×2): qty 1

## 2012-03-12 NOTE — Progress Notes (Signed)
Physical Therapy Note  Order received. Chart reviewed. Spoke briefly with pt who denies need for evaluation. Pt states he is near his baseline and feels he will manage just fine at home. Pt declined to participate. PT will sign off. Thanks.                      Weston Anna, PT [phone]

## 2012-03-12 NOTE — Clinical Documentation Improvement (Signed)
GENERIC DOCUMENTATION CLARIFICATION QUERY  THIS DOCUMENT IS NOT A PERMANENT PART OF THE MEDICAL RECORD  TO RESPOND TO THE THIS QUERY, FOLLOW THE INSTRUCTIONS BELOW:  1. If needed, update documentation for the patient's encounter via the notes activity.  2. Access this query again and click edit on the [facility] [employer].  3. After updating, or not, click F2 to complete all highlighted (required) fields concerning your review. Select "additional documentation in the medical record" OR "no additional documentation provided".  4. Click Sign note button.  5. The deficiency will fall out of your In Basket *Please let us know if you are not able to complete this workflow by phone or e-mail (listed below).  Please update your documentation within the medical record to reflect your response to this query.                                                                                        03/12/12   Dear Dr.Griffin / Associates,  In a better effort to capture your patient's severity of illness, reflect appropriate length of stay and utilization of resources, a review of the patient medical record has revealed the following indicators.    Based on your clinical judgment, please clarify and document in a progress note and/or discharge summary the clinical condition associated with the following supporting information:  In responding to this query please exercise your independent judgment.  The fact that a query is asked, does not imply that any particular answer is desired or expected.  For accurate  specificity & severity, please specify if "previous CVA with right sided weakness " can be further specified in progress notes as any of listings below. Thank you.  -Hemiparesis  -Hemiplegia  _______Other Condition__________________ _______Cannot Clinically Determine   Supporting Information: Risk Factors: CVA with  right sided weakness, DM with complications,   Signs & Symptoms: 03/11/2012    per H&P: " CNS: Mild Right Sided Weakness"                                                  " hx of a previous CVA with right sided weakness"    03/11/2012  Carotid Doppler  no evidence of a right internal carotid artery stenosis, left internal carotid artery demonstrates elevated velocities in the 40-59% range with no evidence of significant stenosis  03/12/2012 OT eval>increased numbness in RUE // Attempted short eval for safety but pt was not agreeable to this 03/12/2012 PT eval> Pt declined to participate,  states he is near his baseline   You may use possible, probable, or suspect with inpatient documentation. possible, probable, suspected diagnoses MUST be documented at the time of discharge  Reviewed: additional documentation in the medical record  Thank You,  Philippa Chester RN  Clinical Documentation Specialist:  Pager [phone] E-mail [email or web address]

## 2012-03-12 NOTE — Progress Notes (Signed)
OT Note:  Pt screened for OT.  He has been getting to 3:1 by himself.  Attempted short eval for safety but pt was not agreeable to this--he feels he is back to baseline.  Pt does have increased numbness in RUE but he reports that it doesn't affect function. Discussed having phone nearby him when he moves throughout house as he reports someone checks on him only  A couple of times a week.  [place], [place] [phone] 03/12/2012

## 2012-03-12 NOTE — Progress Notes (Signed)
On call MD made aware the pt had 6 beats of v-tach vital signs are stable. No new order at this time will continue to monitor pt.

## 2012-03-12 NOTE — Progress Notes (Signed)
Subjective: Admission H&P has been reviewed. Patient's story is somewhat confusing. He states he came to the emergency room because of abdominal pain and constipation. He states he has some tingling in his right hand side of the face while in the emergency room. He has a known history of left cortical CVA which causes numbness and some manual dexterity problems in the right pain. He already is on aspirin and Plavix. Carotid negative for stenosis, CT angiogram negative for CVA. Patient appears to be back to his baseline. Diabetes poor control. The need for aggressive risk factor modification discussed in detail. In regards to constipation patient did have a bowel movement this morning. His abdomen is soft, have discussed obtaining followup x-ray. Followup x-ray without obstruction.patient's potassium has been repleted. He had a minimally abnormal urinalysis however patient has a chronic Foley. There is no leukocytosis no fever therfore will stop Antibiotics.  Objective: Vital signs in last 24 hours: Temp:  [98.2 F (36.8 C)-98.4 F (36.9 C)] 98.2 F (36.8 C) (07/02 2245) Pulse Rate:  [75-80] 75  (07/02 2245) Resp:  [20] 20  (07/02 2245) BP: (129-160)/(73-87) 129/73 mmHg (07/02 2245) SpO2:  [96 %-98 %] 96 % (07/02 2245) Weight change:  Last BM Date: 03/11/12  Intake/Output from previous day: 07/02 0701 - 07/03 0700 In: 240 [P.O.:240] Out: 3750 [Urine:3750] Intake/Output this shift:    General appearance: alert and cooperative Resp: clear to auscultation bilaterally Cardio: regular rate and rhythm, S1, S2 normal, no murmur, click, rub or gallop Extremities: venous stasis dermatitis noted Neurologic: Motor: right upper extremity paresthesia and some clumsiness with the hand. Positive Phalen and Tinel sign right wrist Lab Results:  Results for orders placed during the hospital encounter of 03/10/12 (from the past 24 hour(s))  BASIC METABOLIC PANEL     Status: Abnormal   Collection Time   03/11/12  2:30 PM      Component Value Range   Sodium 132 (*) 135 - 145 mEq/L   Potassium 3.7  3.5 - 5.1 mEq/L   Chloride 98  96 - 112 mEq/L   CO2 24  19 - 32 mEq/L   Glucose, Bld 210 (*) 70 - 99 mg/dL   BUN 5 (*) 6 - 23 mg/dL   Creatinine, Ser 0.51  0.50 - 1.35 mg/dL   Calcium 8.8  8.4 - 10.5 mg/dL   GFR calc non Af Amer >90  >90 mL/min   GFR calc Af Amer >90  >90 mL/min  GLUCOSE, CAPILLARY     Status: Abnormal   Collection Time   03/11/12  4:37 PM      Component Value Range   Glucose-Capillary 264 (*) 70 - 99 mg/dL   Comment 1 Notify RN    GLUCOSE, CAPILLARY     Status: Abnormal   Collection Time   03/11/12  7:59 PM      Component Value Range   Glucose-Capillary 247 (*) 70 - 99 mg/dL   Comment 1 Notify RN    GLUCOSE, CAPILLARY     Status: Abnormal   Collection Time   03/12/12 12:01 AM      Component Value Range   Glucose-Capillary 249 (*) 70 - 99 mg/dL   Comment 1 Notify RN    GLUCOSE, CAPILLARY     Status: Abnormal   Collection Time   03/12/12  4:20 AM      Component Value Range   Glucose-Capillary 272 (*) 70 - 99 mg/dL  GLUCOSE, CAPILLARY     Status: Abnormal  Collection Time   03/12/12  7:24 AM      Component Value Range   Glucose-Capillary 226 (*) 70 - 99 mg/dL   Comment 1 Notify RN    GLUCOSE, CAPILLARY     Status: Abnormal   Collection Time   03/12/12 12:26 PM      Component Value Range   Glucose-Capillary 232 (*) 70 - 99 mg/dL      Studies/Results: Ct Angio Head W/cm &/or Wo Cm  03/11/2012  *RADIOLOGY REPORT*  Clinical Data:  Head and right-sided weakness.  History of prior CVA.  CT ANGIOGRAPHY HEAD  Technique:  Multidetector CT imaging of the head was performed using the standard protocol during bolus administration of intravenous contrast.  Multiplanar CT image reconstructions including MIPs were obtained to evaluate the vascular anatomy.  Contrast: 131mL OMNIPAQUE IOHEXOL 350 MG/ML SOLN  Comparison:  CT head without contrast 03/10/2012 and 10/21/2011.  Findings:   Calcifications in the globus pallidus and thalami are stable bilaterally.  Mild generalized atrophy is evident.  The subcortical white matter hypoattenuation is unchanged.  No acute cortical infarct, hemorrhage, or mass lesion is present.  The postcontrast images demonstrate to no pathologic enhancement. The paranasal sinuses and mastoid air cells are clear.  The source images demonstrate no evidence for acute infarcts.  The internal carotid arteries are within normal limits bilaterally. The A1 and M1 segments are normal.  The MCA bifurcations are normal bilaterally.  The ACA and MCA branch vessels demonstrate some distal attenuation without significant proximal stenosis, aneurysm, or branch vessel occlusion.  The left vertebral artery is the dominant vessel.  There is minimal calcification at the dural margin.  The basilar artery is within normal limits.  Both posterior cerebral arteries originate from the basilar tip.  The PCA branch vessels, demonstrates some distal attenuation.   Review of the MIP images confirms the above findings.  IMPRESSION:  1.  Mild distal small vessel disease. 2.  No significant proximal stenosis, aneurysm, or branch vessel occlusion. 3.  Stable basal ganglia calcifications. 4.  No evidence for acute infarct.  Original Report Authenticated By: Resa Miner. MATTERN, M.D.   Dg Chest 2 View  03/11/2012  *RADIOLOGY REPORT*  Clinical Data: Right arm pain.  CHEST - 2 VIEW  Comparison: 11/12/2011  Findings: Cardiomegaly with mild vascular congestion.  No overt edema.  No focal opacities or effusions.  No acute bony abnormality.  IMPRESSION: Cardiomegaly, vascular congestion.  Original Report Authenticated By: Raelyn Number, M.D.   Dg Abd 1 View  03/12/2012  *RADIOLOGY REPORT*  Clinical Data: Constipation  ABDOMEN - 1 VIEW  Comparison: None  Findings: Multiple surgical clips are identified within the upper abdomen.  There is gas identified within the colon and rectum.  No dilated loops of  small bowel or air-fluid levels.  IMPRESSION:  1.  Nonobstructive bowel gas pattern  Original Report Authenticated By: Angelita Ingles, M.D.   Ct Head Wo Contrast  03/11/2012  *RADIOLOGY REPORT*  Clinical Data: Headache.  CT HEAD WITHOUT CONTRAST  Technique:  Contiguous axial images were obtained from the base of the skull through the vertex without contrast.  Comparison: 10/21/2011  Findings: Stable physiologic calcifications within the basal ganglia and thalami bilaterally. No acute intracranial abnormality. Specifically, no hemorrhage, hydrocephalus, mass lesion, acute infarction, or significant intracranial injury.  No acute calvarial abnormality. Visualized paranasal sinuses and mastoids clear. Orbital soft tissues unremarkable.  IMPRESSION: No acute intracranial abnormality.  Original Report Authenticated By: Raelyn Number, M.D.  Dg Abd 2 Views  03/11/2012  *RADIOLOGY REPORT*  Clinical Data: Upper abdominal pain.  ABDOMEN - 2 VIEW  Comparison: CT 04/19/2010  Findings: Large stool burden in the colon, particularly the right side of the colon.  No evidence of bowel obstruction or free air. No organomegaly.  Prior cholecystectomy.  No acute bony abnormality.  IMPRESSION: A large stool burden in the right side of the colon.  No obstruction or free air.  Original Report Authenticated By: Raelyn Number, M.D.    Medications:  Prior to Admission:  Prescriptions prior to admission  Medication Sig Dispense Refill  . aspirin EC 81 MG tablet Take 81 mg by mouth daily.      . bisacodyl (DULCOLAX) 5 MG EC tablet Take 10 mg by mouth once.      . clopidogrel (PLAVIX) 75 MG tablet Take 1 tablet (75 mg total) by mouth daily with breakfast.  30 tablet  11  . diphenhydrAMINE (BENADRYL) 25 mg capsule Take 25 mg by mouth every 6 (six) hours as needed. For allergy.      . diphenoxylate-atropine (LOMOTIL) 2.5-0.025 MG per tablet Take 1 tablet by mouth 4 (four) times daily as needed. Diarrhea      . furosemide  (LASIX) 40 MG tablet Take 40 mg by mouth daily.      . insulin NPH-insulin regular (NOVOLIN 70/30) (70-30) 100 UNIT/ML injection Inject 55 Units into the skin 2 (two) times daily with a meal.      . isosorbide mononitrate (IMDUR) 30 MG 24 hr tablet Take 1 tablet (30 mg total) by mouth daily.  30 tablet  11  . metoprolol tartrate (LOPRESSOR) 50 MG tablet Take 1 tablet (50 mg total) by mouth 2 (two) times daily.  60 tablet  11  . nitroGLYCERIN (NITROSTAT) 0.4 MG SL tablet Place 1 tablet (0.4 mg total) under the tongue every 5 (five) minutes x 3 doses as needed for chest pain.  1 tablet  1  . omeprazole (PRILOSEC) 20 MG capsule Take 20 mg by mouth daily.      Marland Kitchen oxybutynin (DITROPAN) 5 MG tablet Take 5 mg by mouth 2 (two) times daily as needed. For bladder pain.      . ramipril (ALTACE) 5 MG capsule Take 1 capsule (5 mg total) by mouth daily.  30 capsule  11  . traMADol (ULTRAM) 50 MG tablet Take 50 mg by mouth every 8 (eight) hours as needed. For pain.       Scheduled:   . aspirin  325 mg Oral Daily  . clopidogrel  75 mg Oral Q breakfast  . enoxaparin  40 mg Subcutaneous Q24H  . insulin aspart  0-20 Units Subcutaneous Q4H  . DISCONTD: aspirin  300 mg Rectal Daily  . DISCONTD: ciprofloxacin  400 mg Intravenous Q12H   Continuous:   Assessment/Plan: History of CVA with right-sided weakness and clumsiness of the hand. CT angiogram negative for acute CVA. Continue aspirin, Plavix, aggressive risk factor modification. Constipation, resolved based on followup x-ray. Patient encouraged to increase fluids and roughage Diabetes poor control, improvement recommended. Patient recently had a physical in the office in May as recently were adjusted Morbid obesity Chronic Foley catheter with minimal abnormal UA, patient asymptomatic, we will DCM biotics History of coronary artery disease currently without any angina equivalent symptoms Hyperlipidemia per outpatient records and intolerant to  statin Suspected sleep apnea Chronic venous insufficiency with chronic edema Carpal tunnel syndrome  Hypokalemia resolved  LOS: 2 days  Ivalee Strauser D 03/12/2012, 12:50 PM

## 2012-03-12 NOTE — Discharge Summary (Signed)
Physician Discharge Summary  Patient ID: [name] MRN: [id number] DOB/AGE: Dec 12, 1945 66 y.o.  Admit date: 03/10/2012 Discharge date: 03/12/2012  Admission Diagnoses: Right face right hand weakness, constipation Discharge Diagnoses:  Principal Problem:  *CVA (cerebral infarction) Active Problems:  DM (diabetes mellitus) with complications  UTI (lower urinary tract infection)  Hypertension  Abdominal pain, right lower quadrant  Hypokalemia  Obstipation  Paresthesia constipation Chronic Foley catheter secondary to BPH, Coronary artery disease status post catheter in 2013 show a 90% RCA, [id number] PDA, diastolic dysfunction History of subcortical CVA 2013 with right hand clumsiness and paresthesia Suspected sleep apnea Morbid obesity Hyperlipidemia Venous stasis dermatitis Carpal tunnel syndrome  Discharged Condition: stable  Hospital Course:  Patient was admitted to the hospital after presenting with complaint of abdominal distention and right-sided paresthesia and weakness. Please see dictated admission H&P for further details. Patient was admitted for CVA workup, carotid Doppler without evidence of stenosis. Due to body habitus MRI of the brain was not obtained. CT angiogram was negative for CVA. Of note patient has a known history of CVA with right side clumsiness and paresthesia in the hands. He is currently at his baseline level of function. He will continue his aspirin and Plavix. Unfortunately his diabetes remains poorly controlled, he has dyslipidemia and records suggest he is intolerant to statin. PT and OT consults were requested however patient refused he felt as if he was at his baseline level of function. In regards to his complaint of constipation, there was increased stool burden on x-ray. Followup x-ray without signs of obstruction. Of note patient appears to have issues with chronic diarrhea and takes Lomotil. Currently he is tolerated a regular diet, and he had a bowel  movement while hospitalized. Patient has several other chronic comorbidities he will continue meds prescribed. He's felt to be medically stable for discharge to home  Consults:    Significant Diagnostic Studies:  General appearance: alert and cooperative  Resp: clear to auscultation bilaterally  Cardio: regular rate and rhythm, S1, S2 normal, no murmur, click, rub or gallop  Extremities: venous stasis dermatitis noted  Neurologic: Motor: right upper extremity paresthesia and some clumsiness with the hand.  Positive Phalen and Tinel sign right wrist   Ct Angio Head W/cm &/or Wo Cm  03/11/2012  *RADIOLOGY REPORT*  Clinical Data:  Head and right-sided weakness.  History of prior CVA.  CT ANGIOGRAPHY HEAD  Technique:  Multidetector CT imaging of the head was performed using the standard protocol during bolus administration of intravenous contrast.  Multiplanar CT image reconstructions including MIPs were obtained to evaluate the vascular anatomy.  Contrast: 161mL OMNIPAQUE IOHEXOL 350 MG/ML SOLN  Comparison:  CT head without contrast 03/10/2012 and 10/21/2011.  Findings:  Calcifications in the globus pallidus and thalami are stable bilaterally.  Mild generalized atrophy is evident.  The subcortical white matter hypoattenuation is unchanged.  No acute cortical infarct, hemorrhage, or mass lesion is present.  The postcontrast images demonstrate to no pathologic enhancement. The paranasal sinuses and mastoid air cells are clear.  The source images demonstrate no evidence for acute infarcts.  The internal carotid arteries are within normal limits bilaterally. The A1 and M1 segments are normal.  The MCA bifurcations are normal bilaterally.  The ACA and MCA branch vessels demonstrate some distal attenuation without significant proximal stenosis, aneurysm, or branch vessel occlusion.  The left vertebral artery is the dominant vessel.  There is minimal calcification at the dural margin.  The basilar artery is  within  normal limits.  Both posterior cerebral arteries originate from the basilar tip.  The PCA branch vessels, demonstrates some distal attenuation.   Review of the MIP images confirms the above findings.  IMPRESSION:  1.  Mild distal small vessel disease. 2.  No significant proximal stenosis, aneurysm, or branch vessel occlusion. 3.  Stable basal ganglia calcifications. 4.  No evidence for acute infarct.  Original Report Authenticated By: Resa Miner. MATTERN, M.D.   Dg Chest 2 View  03/11/2012  *RADIOLOGY REPORT*  Clinical Data: Right arm pain.  CHEST - 2 VIEW  Comparison: 11/12/2011  Findings: Cardiomegaly with mild vascular congestion.  No overt edema.  No focal opacities or effusions.  No acute bony abnormality.  IMPRESSION: Cardiomegaly, vascular congestion.  Original Report Authenticated By: Raelyn Number, M.D.   Dg Abd 1 View  03/12/2012  *RADIOLOGY REPORT*  Clinical Data: Constipation  ABDOMEN - 1 VIEW  Comparison: None  Findings: Multiple surgical clips are identified within the upper abdomen.  There is gas identified within the colon and rectum.  No dilated loops of small bowel or air-fluid levels.  IMPRESSION:  1.  Nonobstructive bowel gas pattern  Original Report Authenticated By: Angelita Ingles, M.D.   Ct Head Wo Contrast  03/11/2012  *RADIOLOGY REPORT*  Clinical Data: Headache.  CT HEAD WITHOUT CONTRAST  Technique:  Contiguous axial images were obtained from the base of the skull through the vertex without contrast.  Comparison: 10/21/2011  Findings: Stable physiologic calcifications within the basal ganglia and thalami bilaterally. No acute intracranial abnormality. Specifically, no hemorrhage, hydrocephalus, mass lesion, acute infarction, or significant intracranial injury.  No acute calvarial abnormality. Visualized paranasal sinuses and mastoids clear. Orbital soft tissues unremarkable.  IMPRESSION: No acute intracranial abnormality.  Original Report Authenticated By: Raelyn Number,  M.D.   Dg Abd 2 Views  03/11/2012  *RADIOLOGY REPORT*  Clinical Data: Upper abdominal pain.  ABDOMEN - 2 VIEW  Comparison: CT 04/19/2010  Findings: Large stool burden in the colon, particularly the right side of the colon.  No evidence of bowel obstruction or free air. No organomegaly.  Prior cholecystectomy.  No acute bony abnormality.  IMPRESSION: A large stool burden in the right side of the colon.  No obstruction or free air.  Original Report Authenticated By: Raelyn Number, M.D.      Discharge Exam: Blood pressure 129/73, pulse 75, temperature 98.2 F (36.8 C), temperature source Oral, resp. rate 20, height 6' (1.829 m), weight 171.6 kg (378 lb 5 oz), SpO2 96.00%.  General appearance: alert and cooperative  Resp: clear to auscultation bilaterally  Cardio: regular rate and rhythm, S1, S2 normal, no murmur, click, rub or gallop  Extremities: venous stasis dermatitis noted  Neurologic: Motor: right upper extremity paresthesia and some clumsiness with the hand.  Positive Phalen and Tinel sign right wrist  Disposition: 06-Home-Health Care Svc   Medication List  As of 03/12/2012  1:01 PM   TAKE these medications         aspirin EC 81 MG tablet   Take 81 mg by mouth daily.      bisacodyl 5 MG EC tablet   Commonly known as: DULCOLAX   Take 10 mg by mouth once.      clopidogrel 75 MG tablet   Commonly known as: PLAVIX   Take 1 tablet (75 mg total) by mouth daily with breakfast.      diphenhydrAMINE 25 mg capsule   Commonly known as: BENADRYL   Take 25  mg by mouth every 6 (six) hours as needed. For allergy.      diphenoxylate-atropine 2.5-0.025 MG per tablet   Commonly known as: LOMOTIL   Take 1 tablet by mouth 4 (four) times daily as needed. Diarrhea      furosemide 40 MG tablet   Commonly known as: LASIX   Take 40 mg by mouth daily.      insulin NPH-insulin regular (70-30) 100 UNIT/ML injection   Commonly known as: NOVOLIN 70/30   Inject 55 Units into the skin 2 (two)  times daily with a meal.      isosorbide mononitrate 30 MG 24 hr tablet   Commonly known as: IMDUR   Take 1 tablet (30 mg total) by mouth daily.      metoprolol 50 MG tablet   Commonly known as: LOPRESSOR   Take 1 tablet (50 mg total) by mouth 2 (two) times daily.      nitroGLYCERIN 0.4 MG SL tablet   Commonly known as: NITROSTAT   Place 1 tablet (0.4 mg total) under the tongue every 5 (five) minutes x 3 doses as needed for chest pain.      omeprazole 20 MG capsule   Commonly known as: PRILOSEC   Take 20 mg by mouth daily.      oxybutynin 5 MG tablet   Commonly known as: DITROPAN   Take 5 mg by mouth 2 (two) times daily as needed. For bladder pain.      ramipril 5 MG capsule   Commonly known as: ALTACE   Take 1 capsule (5 mg total) by mouth daily.      traMADol 50 MG tablet   Commonly known as: ULTRAM   Take 50 mg by mouth every 8 (eight) hours as needed. For pain.             SignedSeward Carol D 03/12/2012, 1:01 PM

## 2012-03-12 NOTE — Clinical Documentation Improvement (Signed)
BMI DOCUMENTATION CLARIFICATION QUERY  THIS DOCUMENT IS NOT A PERMANENT PART OF THE MEDICAL RECORD  TO RESPOND TO THE THIS QUERY, FOLLOW THE INSTRUCTIONS BELOW:  1. If needed, update documentation for the patient's encounter via the notes activity.  2. Access this query again and click edit on the [employer].  3. After updating, or not, click F2 to complete all highlighted (required) fields concerning your review. Select "additional documentation in the medical record" OR "no additional documentation provided".  4. Click Sign note button.  5. The deficiency will fall out of your In Basket *Please let us know if you are not able to complete this workflow by phone or e-mail (listed below).         03/12/12  Dear [name] Rolley Sims  In an effort to better capture your patient's severity of illness, reflect appropriate length of stay and utilization of resources, a review of the patient medical record has revealed the following indicators.    Based on your clinical judgment, please clarify and document in a progress note and/or discharge summary the clinical condition associated with the following supporting information:  In responding to this query please exercise your independent judgment.  The fact that a query is asked, does not imply that any particular answer is desired or expected.  Based on your clinical judgment, please document in the progress notes and discharge summary if condition below provides greater specificity regarding the patient's height and weight   Morbid Obesity W/ BMI= 51.4  Other condition___________________  Cannot Clinically determine _____________  Risk Factors: CVA, DM with complications, HTN   Signs & Symptoms: Weight: 378 lbs   Height: 6 ft   BMI = 51.4  Treatment Nutrition Note: 03/11/12  Diabetes Coordinator  Nursing Note:  03/11/12 "patient exceeds the weigh limit for MRI"     Reviewed: additional documentation in the medical  record  (GT) Dr.Polite added doc on pn  Thank You, Philippa Chester RN  Clinical Documentation Specialist:  Pager [phone] E-mail [email or web address]

## 2012-03-12 NOTE — Progress Notes (Signed)
SLP Screen Note  SLP interviewed pt briefly regarding speech/language/memory.  Pt reports word finding deficits have improved and does not feel need for evaluation.  SlP provided tips to compensate for word finding - SlP did not observe anomia difficulties during out conversation.  Please reorder SLP if desired.  Thanks.   Luanna Salk, [place] [facility] SLP [phone]

## 2012-03-12 NOTE — Progress Notes (Signed)
Per RN, Pt needing taxi voucher, [place] one to provide transportation.  Met with Pt who humbly requested a taxi voucher, as he has no access to the bus line.  [name] to provide a taxi voucher for Pt needing, per Pt, a van from [facility], as he is unable to get into the back seat of a taxi car.  Pt to be d/c'd.  Bernita Raisin, [place] [employer] [phone]

## 2012-03-14 LAB — URINE CULTURE: Colony Count: >=100000

## 2012-03-27 ENCOUNTER — Emergency Department (HOSPITAL_COMMUNITY): Payer: Medicare Other

## 2012-03-27 ENCOUNTER — Observation Stay (HOSPITAL_COMMUNITY)
Admission: EM | Admit: 2012-03-27 | Discharge: 2012-03-30 | Disposition: A | Discharge status: Home / Self Care | Payer: Medicare Other | Attending: Internal Medicine | Admitting: Internal Medicine

## 2012-03-27 ENCOUNTER — Encounter (HOSPITAL_COMMUNITY): Payer: Self-pay | Admitting: Emergency Medicine

## 2012-03-27 DIAGNOSIS — I10 Essential (primary) hypertension: Secondary | ICD-10-CM

## 2012-03-27 DIAGNOSIS — I872 Venous insufficiency (chronic) (peripheral): Secondary | ICD-10-CM | POA: Insufficient documentation

## 2012-03-27 DIAGNOSIS — E1149 Type 2 diabetes mellitus with other diabetic neurological complication: Secondary | ICD-10-CM | POA: Insufficient documentation

## 2012-03-27 DIAGNOSIS — N390 Urinary tract infection, site not specified: Principal | ICD-10-CM | POA: Diagnosis present

## 2012-03-27 DIAGNOSIS — Z8673 Personal history of transient ischemic attack (TIA), and cerebral infarction without residual deficits: Secondary | ICD-10-CM | POA: Insufficient documentation

## 2012-03-27 DIAGNOSIS — R5381 Other malaise: Secondary | ICD-10-CM | POA: Insufficient documentation

## 2012-03-27 DIAGNOSIS — R0602 Shortness of breath: Secondary | ICD-10-CM | POA: Insufficient documentation

## 2012-03-27 DIAGNOSIS — G4733 Obstructive sleep apnea (adult) (pediatric): Secondary | ICD-10-CM | POA: Insufficient documentation

## 2012-03-27 DIAGNOSIS — R269 Unspecified abnormalities of gait and mobility: Secondary | ICD-10-CM | POA: Diagnosis present

## 2012-03-27 DIAGNOSIS — R42 Dizziness and giddiness: Secondary | ICD-10-CM | POA: Insufficient documentation

## 2012-03-27 DIAGNOSIS — W19XXXA Unspecified fall, initial encounter: Secondary | ICD-10-CM

## 2012-03-27 DIAGNOSIS — H538 Other visual disturbances: Secondary | ICD-10-CM

## 2012-03-27 DIAGNOSIS — E1142 Type 2 diabetes mellitus with diabetic polyneuropathy: Secondary | ICD-10-CM | POA: Insufficient documentation

## 2012-03-27 DIAGNOSIS — R739 Hyperglycemia, unspecified: Secondary | ICD-10-CM

## 2012-03-27 DIAGNOSIS — G8929 Other chronic pain: Secondary | ICD-10-CM | POA: Insufficient documentation

## 2012-03-27 DIAGNOSIS — M549 Dorsalgia, unspecified: Secondary | ICD-10-CM

## 2012-03-27 DIAGNOSIS — M25559 Pain in unspecified hip: Secondary | ICD-10-CM | POA: Insufficient documentation

## 2012-03-27 DIAGNOSIS — E860 Dehydration: Secondary | ICD-10-CM | POA: Insufficient documentation

## 2012-03-27 HISTORY — DX: Gastro-esophageal reflux disease without esophagitis: K21.9

## 2012-03-27 HISTORY — DX: Hyperlipidemia, unspecified: E78.5

## 2012-03-27 LAB — CBC WITH DIFFERENTIAL/PLATELET
Basophils Absolute: 0.1 10*3/uL (ref 0.0–0.1)
Eosinophils Relative: 3 % (ref 0–5)
HCT: 43 % (ref 39.0–52.0)
Hemoglobin: 14.9 g/dL (ref 13.0–17.0)
Lymphocytes Relative: 18 % (ref 12–46)
Lymphs Abs: 1.6 10*3/uL (ref 0.7–4.0)
MCV: 87 fL (ref 78.0–100.0)
Monocytes Absolute: 0.6 10*3/uL (ref 0.1–1.0)
Monocytes Relative: 7 % (ref 3–12)
Neutro Abs: 6.5 10*3/uL (ref 1.7–7.7)
RBC: 4.94 MIL/uL (ref 4.22–5.81)
RDW: 13.5 % (ref 11.5–15.5)
WBC: 9 10*3/uL (ref 4.0–10.5)

## 2012-03-27 LAB — URINALYSIS, ROUTINE W REFLEX MICROSCOPIC
Glucose, UA: >1000 mg/dL — AB
Hgb urine dipstick: NEGATIVE
Ketones, ur: NEGATIVE mg/dL
Leukocytes, UA: NEGATIVE
pH: 7 (ref 5.0–8.0)

## 2012-03-27 LAB — BASIC METABOLIC PANEL
CO2: 24 mEq/L (ref 19–32)
Calcium: 8.6 mg/dL (ref 8.4–10.5)
Chloride: 96 mEq/L (ref 96–112)
Creatinine, Ser: 0.57 mg/dL (ref 0.50–1.35)
Glucose, Bld: 337 mg/dL — ABNORMAL HIGH (ref 70–99)

## 2012-03-27 LAB — GLUCOSE, CAPILLARY: Glucose-Capillary: 310 mg/dL — ABNORMAL HIGH (ref 70–99)

## 2012-03-27 MED ORDER — CIPROFLOXACIN IN D5W 400 MG/200ML IV SOLN
400.0000 mg | Freq: Once | INTRAVENOUS | Status: AC
  Administered 2012-03-27: 400 mg via INTRAVENOUS
  Filled 2012-03-27: qty 200

## 2012-03-27 MED ORDER — ONDANSETRON HCL 4 MG/2ML IJ SOLN
4.0000 mg | Freq: Once | INTRAMUSCULAR | Status: AC
  Administered 2012-03-27: 4 mg via INTRAVENOUS
  Filled 2012-03-27: qty 2

## 2012-03-27 MED ORDER — SODIUM CHLORIDE 0.9 % IV BOLUS (SEPSIS)
500.0000 mL | Freq: Once | INTRAVENOUS | Status: AC
  Administered 2012-03-27: 500 mL via INTRAVENOUS

## 2012-03-27 MED ORDER — HYDROMORPHONE HCL PF 1 MG/ML IJ SOLN
1.0000 mg | Freq: Once | INTRAMUSCULAR | Status: AC
Start: 2012-03-27 — End: 2012-03-27
  Administered 2012-03-27: 1 mg via INTRAVENOUS
  Filled 2012-03-27: qty 1

## 2012-03-27 MED ORDER — SODIUM CHLORIDE 0.9 % IV SOLN
INTRAVENOUS | Status: DC
  Administered 2012-03-27 (×2): via INTRAVENOUS

## 2012-03-27 NOTE — ED Notes (Signed)
Pt brought in by EMS for s/p fall pt dont remember falling just woke up on the floor c/o neck pain and pain in the back of his head

## 2012-03-27 NOTE — ED Notes (Signed)
[id number] Expected date:03/27/12<BR> Expected time: 6:36 PM<BR> Means of arrival:Ambulance<BR> Comments:<BR> Fall, back pain

## 2012-03-27 NOTE — ED Notes (Signed)
Pt stated right hip is hurting.

## 2012-03-27 NOTE — ED Notes (Signed)
Pt states that his vision is blurry. Pt stated that even without his glasses  he can see faces.

## 2012-03-27 NOTE — ED Provider Notes (Cosign Needed)
History     CSN: [id number]  Arrival date & time 03/27/12  1850   First MD Initiated Contact with Patient 03/27/12 1927      Chief Complaint  Patient presents with  . Fall    (Consider location/radiation/quality/duration/timing/severity/associated sxs/prior treatment) HPI Comments: [name] is a 66 y.o. Male who was moving things around in his kitchen when he suddenly fell. He injured his back and right side when he fell. He was unable to get up, but was able to crawl to get a phone. He called an ambulance and they brought him here fully immobilized. He has not been treated with anything. On arrival he denies loss of consciousness. He cannot recall exactly why he fell. He was recently admitted, and discharged from the hospital with apparent treatment for urinary tract infection. He, states he is taking his usual medications. There are no known aggravating or palliative factors  The history is provided by the patient.    Past Medical History  Diagnosis Date  . Obese   . Hypertension   . Diabetes mellitus   . Diabetic diarrhea   . Sleep apnea   . Diabetic neuropathy, painful   . Chronic indwelling foley catheter   . Bladder spasms   . Complication of anesthesia   . PONV (postoperative nausea and vomiting)     Past Surgical History  Procedure Date  . Right tkr   . Gastroplasty   . Cholecystectomy   . Cardiac catheterization     Family History  Problem Relation Age of Onset  . Hypertension Mother   . Diabetes type I Father   . Cancer Brother      Testicular Cancer  . Cancer Brother     Leukemia    History  Substance Use Topics  . Smoking status: Former Smoker -- 2.0 packs/day  . Smokeless tobacco: Never Used  . Alcohol Use: No     FORMER ALCOHOLIC WITH 23 YEAR SOBRIETY      Review of Systems  All other systems reviewed and are negative.    Allergies  Ace inhibitors; Cefadroxil; Cephalexin; Lipitor; Metformin and related; Methocarbamol; and  Morphine and related  Home Medications   Current Outpatient Rx  Name Route Sig Dispense Refill  . ASPIRIN EC 81 MG PO TBEC Oral Take 81 mg by mouth daily.    Marland Kitchen BISACODYL 5 MG PO TBEC Oral Take 10 mg by mouth once.    . CLOPIDOGREL BISULFATE 75 MG PO TABS Oral Take 1 tablet (75 mg total) by mouth daily with breakfast. 30 tablet 11  . DIPHENHYDRAMINE HCL 25 MG PO CAPS Oral Take 25 mg by mouth every 6 (six) hours as needed. For allergy.    Marland Kitchen DIPHENOXYLATE-ATROPINE 2.5-0.025 MG PO TABS Oral Take 1 tablet by mouth 4 (four) times daily as needed. Diarrhea    . FUROSEMIDE 40 MG PO TABS Oral Take 40 mg by mouth daily.    . INSULIN ISOPHANE & REGULAR (70-30) 100 UNIT/ML [place] SUSP Subcutaneous Inject 55 Units into the skin 2 (two) times daily with a meal.    . ISOSORBIDE MONONITRATE ER 30 MG PO TB24 Oral Take 1 tablet (30 mg total) by mouth daily. 30 tablet 11  . METOPROLOL TARTRATE 50 MG PO TABS Oral Take 1 tablet (50 mg total) by mouth 2 (two) times daily. 60 tablet 11  . NITROGLYCERIN 0.4 MG SL SUBL Sublingual Place 1 tablet (0.4 mg total) under the tongue every 5 (five) minutes x 3  doses as needed for chest pain. 1 tablet 1  . OMEPRAZOLE 20 MG PO CPDR Oral Take 20 mg by mouth daily.    . OXYBUTYNIN CHLORIDE 5 MG PO TABS Oral Take 5 mg by mouth 2 (two) times daily as needed. For bladder pain.    Marland Kitchen RAMIPRIL 5 MG PO CAPS Oral Take 1 capsule (5 mg total) by mouth daily. 30 capsule 11  . TRAMADOL HCL 50 MG PO TABS Oral Take 50 mg by mouth every 8 (eight) hours as needed. For pain.      BP 157/74  Pulse 87  Temp 97.7 F (36.5 C) (Oral)  Resp 20  SpO2 95%  Physical Exam  Nursing note and vitals reviewed. Constitutional: He is oriented to person, place, and time. He appears well-developed.       Overweight, ill-appearing  HENT:  Head: Normocephalic and atraumatic.  Right Ear: External ear normal.  Left Ear: External ear normal.  Eyes: Conjunctivae and EOM are normal. Pupils are equal, round, and  reactive to light.       Mucous membranes are dry  Neck: Normal range of motion and phonation normal. Neck supple.  Cardiovascular: Normal rate, regular rhythm, normal heart sounds and intact distal pulses.   Pulmonary/Chest: Effort normal and breath sounds normal. He exhibits no bony tenderness.  Abdominal: Soft. Normal appearance. There is no tenderness.  Musculoskeletal: He exhibits edema (Bilateral lower leg) and tenderness (Diffuse right arm tenderness without deformity).       Diffuse paravertebral muscular tenderness. Mild, upper thoracic, and lower lumbar tenderness to palpation. No palpable deformity or step-off. Collar in place during examination  Neurological: He is alert and oriented to person, place, and time. He has normal strength. No cranial nerve deficit or sensory deficit. He exhibits normal muscle tone. Coordination normal.  Skin: Skin is warm, dry and intact.       Superficial abrasions, anterior lower legs in various stages of healing, without associated infection, bleeding, or swelling  Psychiatric: He has a normal mood and affect. His behavior is normal.    ED Course  Procedures (including critical care time) He was log rolled from the back-board, with cervical spine precautions, and cervical collar, left in place.. Emergency department treatment: IV fluid bolus, and drip. IV, Cipro for urinary tract infection. Dilaudid and Zofran for pain.  Reevaluation:  23:15-   patient is alert, calm, and cooperative. He has mild, neck pain at rest. Cervical collar removed, and he has full range of motion to flexion, extension, with minimal neck pain. Arm and leg. Strength. Grossly normal. Patient now states that his vision is blurry.    Labs Reviewed  GLUCOSE, CAPILLARY - Abnormal; Notable for the following:    Glucose-Capillary 310 (*)     All other components within normal limits  BASIC METABOLIC PANEL - Abnormal; Notable for the following:    Sodium 132 (*)     Glucose, Bld  337 (*)     All other components within normal limits  URINALYSIS, ROUTINE W REFLEX MICROSCOPIC - Abnormal; Notable for the following:    Glucose, UA >1000 (*)     Nitrite POSITIVE (*)     All other components within normal limits  URINE MICROSCOPIC-ADD ON - Abnormal; Notable for the following:    Bacteria, UA MANY (*)     All other components within normal limits  CBC WITH DIFFERENTIAL  URINE CULTURE   Dg Cervical Spine Complete  03/27/2012  *RADIOLOGY REPORT*  Clinical Data:  66 year old male with fall and neck pain.  CERVICAL SPINE - COMPLETE 4+ VIEW  Comparison: 03/11/2012 CT  Findings: Due to patient body habitus and despite numerous attempts, the cervical spine is visualized only to the C5-C6 level on the lateral view. There is no definite evidence of fracture, subluxation or prevertebral soft tissue swelling. No focal bony lesions are present.  IMPRESSION: Limited evaluation of the cervical spine secondary to body habitus/position.  CT is recommended for complete evaluation.  Original Report Authenticated By: Lura Em, M.D.   Dg Thoracic Spine 2 View  03/27/2012  *RADIOLOGY REPORT*  Clinical Data: Fall with mid back pain.  THORACIC SPINE - 2 VIEW  Comparison: 10/18/2011 chest radiograph  Findings: Normal alignment is noted. Anterior wedging of T11 and T12 are unchanged. There is no evidence of acute fracture or subluxation. Diffuse osteopenia is noted.  IMPRESSION: No evidence of acute bony abnormality.  Original Report Authenticated By: Lura Em, M.D.   Dg Lumbar Spine Complete  03/27/2012  *RADIOLOGY REPORT*  Clinical Data: Fall with low back pain.  LUMBAR SPINE - COMPLETE 4+ VIEW  Comparison: Previous abdominal radiographs.  Findings: Five non-rib bearing lumbar type vertebra are identified There is no evidence of acute fracture or subluxation. Moderate degenerative disc disease and facet arthropathy at L4-L5 noted. Anterior wedging of T11 and T12 are again noted. No  spondylolysis or focal bony lesions are noted.  IMPRESSION: No evidence of acute bony abnormality.  Moderate degenerative changes at L4-L5.  Original Report Authenticated By: Lura Em, M.D.   Ct Head Wo Contrast  03/27/2012  *RADIOLOGY REPORT*  Clinical Data:  Patient found down, occipital headache and neck pain  CT HEAD WITHOUT CONTRAST CT CERVICAL SPINE WITHOUT CONTRAST  Technique:  Multidetector CT imaging of the head and cervical spine was performed following the standard protocol without intravenous contrast.  Multiplanar CT image reconstructions of the cervical spine were also generated.  Comparison:  Head CT 03/10/2012  CT HEAD  Findings: Basal ganglial hypodensity, likely calcification, is stable.  Stable mild cortical volume loss with proportional ventricular prominence. No acute hemorrhage, acute infarction, or mass lesion is seen.  Orbits and paranasal sinuses are unremarkable with the exception of minimal ethmoid mucoperiosteal thickening. No skull fracture.  IMPRESSION: No new acute intracranial finding.  CT CERVICAL SPINE  Findings: C1 through the cervical thoracic junction is visualized in its entirety.  Normal alignment. No precervical soft tissue widening is present.  No fracture or dislocation. Curvilinear biapical scarring or atelectasis.  IMPRESSION: No acute abnormality of the cervical spine.  Original Report Authenticated By: Arline Asp, M.D.   Ct Cervical Spine Wo Contrast  03/27/2012  *RADIOLOGY REPORT*  Clinical Data:  Patient found down, occipital headache and neck pain  CT HEAD WITHOUT CONTRAST CT CERVICAL SPINE WITHOUT CONTRAST  Technique:  Multidetector CT imaging of the head and cervical spine was performed following the standard protocol without intravenous contrast.  Multiplanar CT image reconstructions of the cervical spine were also generated.  Comparison:  Head CT 03/10/2012  CT HEAD  Findings: Basal ganglial hypodensity, likely calcification, is stable.  Stable  mild cortical volume loss with proportional ventricular prominence. No acute hemorrhage, acute infarction, or mass lesion is seen.  Orbits and paranasal sinuses are unremarkable with the exception of minimal ethmoid mucoperiosteal thickening. No skull fracture.  IMPRESSION: No new acute intracranial finding.  CT CERVICAL SPINE  Findings: C1 through the cervical thoracic junction is visualized in its entirety.  Normal  alignment. No precervical soft tissue widening is present.  No fracture or dislocation. Curvilinear biapical scarring or atelectasis.  IMPRESSION: No acute abnormality of the cervical spine.  Original Report Authenticated By: Arline Asp, M.D.     1. Fall   2. Urinary tract infection   3. Blurred vision   4. Back pain       MDM  Recurrent and/or persistent, urinary tract infection, leading to fall from dehydration, and weakness. Doubt CVA, spinal fracture, or contusion of spinal cord. Doubt sepsis, or metabolic instability. He has mild, hyperglycemia, related to the urinary tract infection. He'll need to be admitted for stabilization.    Plan: [facility] for Dr. Kathlene Cote, MD 03/28/12 [phone]

## 2012-03-28 ENCOUNTER — Observation Stay (HOSPITAL_COMMUNITY): Payer: Medicare Other

## 2012-03-28 ENCOUNTER — Encounter (HOSPITAL_COMMUNITY): Payer: Self-pay | Admitting: Internal Medicine

## 2012-03-28 DIAGNOSIS — R7309 Other abnormal glucose: Secondary | ICD-10-CM

## 2012-03-28 DIAGNOSIS — H538 Other visual disturbances: Secondary | ICD-10-CM

## 2012-03-28 DIAGNOSIS — I6330 Cerebral infarction due to thrombosis of unspecified cerebral artery: Secondary | ICD-10-CM

## 2012-03-28 DIAGNOSIS — W19XXXA Unspecified fall, initial encounter: Secondary | ICD-10-CM

## 2012-03-28 DIAGNOSIS — H539 Unspecified visual disturbance: Secondary | ICD-10-CM | POA: Insufficient documentation

## 2012-03-28 LAB — GLUCOSE, CAPILLARY
Glucose-Capillary: 273 mg/dL — ABNORMAL HIGH (ref 70–99)
Glucose-Capillary: 164 mg/dL — ABNORMAL HIGH (ref 70–99)

## 2012-03-28 LAB — HEMOGLOBIN A1C: Mean Plasma Glucose: 226 mg/dL — ABNORMAL HIGH (ref <117)

## 2012-03-28 MED ORDER — SODIUM CHLORIDE 0.9 % IV SOLN
INTRAVENOUS | Status: AC
  Administered 2012-03-28: 14:00:00 via INTRAVENOUS

## 2012-03-28 MED ORDER — INSULIN ASPART 100 UNIT/ML ~~LOC~~ SOLN: 0.0000 [IU] | Freq: Every day | SUBCUTANEOUS | Status: DC

## 2012-03-28 MED ORDER — INSULIN ASPART 100 UNIT/ML ~~LOC~~ SOLN
0.0000 [IU] | Freq: Three times a day (TID) | SUBCUTANEOUS | Status: DC
  Administered 2012-03-28: 2 [IU] via SUBCUTANEOUS
  Administered 2012-03-28: 5 [IU] via SUBCUTANEOUS
  Administered 2012-03-28: 3 [IU] via SUBCUTANEOUS
  Administered 2012-03-29: 1 [IU] via SUBCUTANEOUS
  Administered 2012-03-29 – 2012-03-30 (×3): 2 [IU] via SUBCUTANEOUS

## 2012-03-28 MED ORDER — ISOSORBIDE MONONITRATE ER 30 MG PO TB24
30.0000 mg | ORAL_TABLET | Freq: Every day | ORAL | Status: DC
  Administered 2012-03-28 – 2012-03-30 (×3): 30 mg via ORAL
  Filled 2012-03-28 (×3): qty 1

## 2012-03-28 MED ORDER — HYDROCODONE-ACETAMINOPHEN 5-325 MG PO TABS
0.5000 | ORAL_TABLET | Freq: Four times a day (QID) | ORAL | Status: DC | PRN
  Administered 2012-03-28: 0.5 via ORAL
  Administered 2012-03-29: 1 via ORAL
  Filled 2012-03-28 (×2): qty 1

## 2012-03-28 MED ORDER — DIPHENHYDRAMINE HCL 25 MG PO CAPS: 25.0000 mg | ORAL_CAPSULE | Freq: Four times a day (QID) | ORAL | Status: DC | PRN

## 2012-03-28 MED ORDER — LIVING WELL WITH DIABETES BOOK
Freq: Once | Status: AC
  Administered 2012-03-28: 16:00:00
  Filled 2012-03-28: qty 1

## 2012-03-28 MED ORDER — CLOPIDOGREL BISULFATE 75 MG PO TABS
75.0000 mg | ORAL_TABLET | Freq: Every day | ORAL | Status: DC
  Administered 2012-03-28 – 2012-03-30 (×3): 75 mg via ORAL
  Filled 2012-03-28 (×4): qty 1

## 2012-03-28 MED ORDER — DIPHENOXYLATE-ATROPINE 2.5-0.025 MG PO TABS: 1.0000 | ORAL_TABLET | Freq: Four times a day (QID) | ORAL | Status: DC | PRN

## 2012-03-28 MED ORDER — INSULIN ASPART PROT & ASPART (70-30 MIX) 100 UNIT/ML ~~LOC~~ SUSP
50.0000 [IU] | Freq: Two times a day (BID) | SUBCUTANEOUS | Status: DC
  Administered 2012-03-28 – 2012-03-30 (×5): 50 [IU] via SUBCUTANEOUS
  Filled 2012-03-28: qty 3

## 2012-03-28 MED ORDER — BISACODYL 5 MG PO TBEC
10.0000 mg | DELAYED_RELEASE_TABLET | Freq: Once | ORAL | Status: DC
  Filled 2012-03-28: qty 2

## 2012-03-28 MED ORDER — INSULIN ASPART 100 UNIT/ML ~~LOC~~ SOLN: 0.0000 [IU] | Freq: Three times a day (TID) | SUBCUTANEOUS | Status: DC

## 2012-03-28 MED ORDER — FUROSEMIDE 40 MG PO TABS
40.0000 mg | ORAL_TABLET | Freq: Every day | ORAL | Status: DC
  Administered 2012-03-28 – 2012-03-30 (×3): 40 mg via ORAL
  Filled 2012-03-28 (×3): qty 1

## 2012-03-28 MED ORDER — CYCLOBENZAPRINE HCL 5 MG PO TABS
5.0000 mg | ORAL_TABLET | Freq: Three times a day (TID) | ORAL | Status: DC
  Administered 2012-03-28 – 2012-03-30 (×7): 5 mg via ORAL
  Filled 2012-03-28 (×9): qty 1

## 2012-03-28 MED ORDER — PANTOPRAZOLE SODIUM 40 MG PO TBEC
40.0000 mg | DELAYED_RELEASE_TABLET | Freq: Every day | ORAL | Status: DC
  Administered 2012-03-28 – 2012-03-29 (×2): 40 mg via ORAL
  Filled 2012-03-28 (×2): qty 1

## 2012-03-28 MED ORDER — OXYCODONE-ACETAMINOPHEN 5-325 MG PO TABS
1.0000 | ORAL_TABLET | Freq: Once | ORAL | Status: AC
  Administered 2012-03-28: 1 via ORAL
  Filled 2012-03-28: qty 1

## 2012-03-28 MED ORDER — LOPERAMIDE HCL 2 MG PO CAPS
4.0000 mg | ORAL_CAPSULE | Freq: Four times a day (QID) | ORAL | Status: DC | PRN
  Administered 2012-03-28: 4 mg via ORAL
  Filled 2012-03-28: qty 2

## 2012-03-28 MED ORDER — RAMIPRIL 5 MG PO CAPS
5.0000 mg | ORAL_CAPSULE | Freq: Every day | ORAL | Status: DC
  Administered 2012-03-28 – 2012-03-30 (×3): 5 mg via ORAL
  Filled 2012-03-28 (×3): qty 1

## 2012-03-28 MED ORDER — TRAMADOL HCL 50 MG PO TABS
50.0000 mg | ORAL_TABLET | Freq: Four times a day (QID) | ORAL | Status: DC | PRN
  Administered 2012-03-28 (×3): 50 mg via ORAL
  Filled 2012-03-28 (×3): qty 1

## 2012-03-28 MED ORDER — SODIUM CHLORIDE 0.9 % IJ SOLN
3.0000 mL | Freq: Two times a day (BID) | INTRAMUSCULAR | Status: DC
  Administered 2012-03-28 – 2012-03-29 (×3): 3 mL via INTRAVENOUS

## 2012-03-28 MED ORDER — NITROGLYCERIN 0.4 MG SL SUBL: 0.4000 mg | SUBLINGUAL_TABLET | SUBLINGUAL | Status: DC | PRN

## 2012-03-28 MED ORDER — ASPIRIN EC 81 MG PO TBEC
81.0000 mg | DELAYED_RELEASE_TABLET | Freq: Every day | ORAL | Status: DC
  Administered 2012-03-28 – 2012-03-30 (×3): 81 mg via ORAL
  Filled 2012-03-28 (×3): qty 1

## 2012-03-28 MED ORDER — OXYBUTYNIN CHLORIDE 5 MG PO TABS
5.0000 mg | ORAL_TABLET | Freq: Two times a day (BID) | ORAL | Status: DC | PRN
  Administered 2012-03-28 – 2012-03-29 (×2): 5 mg via ORAL
  Filled 2012-03-28 (×3): qty 1

## 2012-03-28 MED ORDER — METOPROLOL TARTRATE 50 MG PO TABS
50.0000 mg | ORAL_TABLET | Freq: Two times a day (BID) | ORAL | Status: DC
  Administered 2012-03-28 – 2012-03-30 (×5): 50 mg via ORAL
  Filled 2012-03-28 (×6): qty 1

## 2012-03-28 MED ORDER — CIPROFLOXACIN HCL 500 MG PO TABS
500.0000 mg | ORAL_TABLET | Freq: Two times a day (BID) | ORAL | Status: DC
  Administered 2012-03-28 – 2012-03-30 (×5): 500 mg via ORAL
  Filled 2012-03-28 (×7): qty 1

## 2012-03-28 NOTE — Progress Notes (Signed)
Rehab admissions - Evaluated for possible admission.  Patient has [employer].  It is unlikely that we could get approval for an inpatient rehab admission.  I will follow up on Monday for progress and plans.  Call me for questions.  [id number]

## 2012-03-28 NOTE — Consult Note (Signed)
Physical Medicine and Rehabilitation Consult Reason for Consult: Deconditioning/falls Referring Physician: [facility]   HPI: [name] is a 66 y.o. right-handed male with history of obesity(380) pounds, diabetes mellitus with neuropathy and chronic indwelling Foley catheter. Admitted 03/27/2012 and after a fall at home while putting away groceries without loss of consciousness. Patient lives alone and was independent with a cane prior to admission. Cranial CT scan negative as well as CT cervical spine bilateral hips and pelvis films negative. Hemoglobin A1c of 9.4 maintained on insulin therapy. UA with positive nitrite and placed on empiric antibiotics with cultures and sensitivities pending. Patient with ischemic changes lower extremities with noted diabetic neuropathy. Physical and occupational therapy ongoing noted deconditioning with M.D. requested physical medicine rehabilitation consult to consider inpatient rehabilitation services.   Review of Systems  Cardiovascular: Positive for leg swelling.  Genitourinary:       Neurogenic bladder  Musculoskeletal: Positive for myalgias and falls.  Neurological: Positive for weakness.  All other systems reviewed and are negative.   Past Medical History  Diagnosis Date  . Obese   . Hypertension   . Diabetes mellitus   . Diabetic diarrhea   . Sleep apnea   . Diabetic neuropathy, painful   . Chronic indwelling foley catheter   . Bladder spasms   . Complication of anesthesia   . PONV (postoperative nausea and vomiting)   . Hyperlipidemia   . GERD (gastroesophageal reflux disease)    Past Surgical History  Procedure Date  . Right tkr   . Gastroplasty   . Cholecystectomy   . Cardiac catheterization    Family History  Problem Relation Age of Onset  . Hypertension Mother   . Diabetes type I Father   . Cancer Brother      Testicular Cancer  . Cancer Brother     Leukemia   Social History:  reports that he quit smoking about 35 years  ago. He has never used smokeless tobacco. He reports that he does not drink alcohol or use illicit drugs. Allergies:  Allergies  Allergen Reactions  . Ace Inhibitors Swelling  . Cefadroxil Hives  . Cephalexin Hives  . Lipitor (Atorvastatin Calcium) Swelling  . Metformin And Related Swelling  . Methocarbamol Other (See Comments)    Feels like is in "rocky boat"  . Morphine And Related Other (See Comments)    Sweating, feels like is "in rocky boat."   Medications Prior to Admission  Medication Sig Dispense Refill  . aspirin EC 81 MG tablet Take 81 mg by mouth daily.      . bisacodyl (DULCOLAX) 5 MG EC tablet Take 10 mg by mouth once.      . clopidogrel (PLAVIX) 75 MG tablet Take 1 tablet (75 mg total) by mouth daily with breakfast.  30 tablet  11  . diphenhydrAMINE (BENADRYL) 25 mg capsule Take 25 mg by mouth every 6 (six) hours as needed. For allergy.      . diphenoxylate-atropine (LOMOTIL) 2.5-0.025 MG per tablet Take 1 tablet by mouth 4 (four) times daily as needed. Diarrhea      . furosemide (LASIX) 40 MG tablet Take 40 mg by mouth daily.      . insulin NPH-insulin regular (NOVOLIN 70/30) (70-30) 100 UNIT/ML injection Inject 55 Units into the skin 2 (two) times daily with a meal.      . isosorbide mononitrate (IMDUR) 30 MG 24 hr tablet Take 1 tablet (30 mg total) by mouth daily.  30 tablet  11  .  metoprolol tartrate (LOPRESSOR) 50 MG tablet Take 1 tablet (50 mg total) by mouth 2 (two) times daily.  60 tablet  11  . nitroGLYCERIN (NITROSTAT) 0.4 MG SL tablet Place 1 tablet (0.4 mg total) under the tongue every 5 (five) minutes x 3 doses as needed for chest pain.  1 tablet  1  . omeprazole (PRILOSEC) 20 MG capsule Take 20 mg by mouth daily.      Marland Kitchen oxybutynin (DITROPAN) 5 MG tablet Take 5 mg by mouth 2 (two) times daily as needed. For bladder pain.      . ramipril (ALTACE) 5 MG capsule Take 1 capsule (5 mg total) by mouth daily.  30 capsule  11  . traMADol (ULTRAM) 50 MG tablet Take 50 mg  by mouth every 8 (eight) hours as needed. For pain.        Home: Home Living Lives With: Alone Type of Home: House Home Access: Stairs to enter Entrance Stairs-Number of Steps: 1 Home Layout: One level Bathroom Shower/Tub: Chiropodist: Handicapped height [place]: Shower chair with back;Grab bars in shower;Grab bars around toilet;Straight cane;Quad cane;Walker - standard  Functional History: Prior Function Driving: Yes Vocation: Retired Functional Status:  Mobility: Bed Mobility Bed Mobility: Supine to Sit;Sit to Supine Supine to Sit: 1: +2 Total assist;With rails;HOB elevated Supine to Sit: Patient Percentage: 80% Sit to Supine: 1: +2 Total assist;With rail;HOB elevated Sit to Supine: Patient Percentage: 40% Transfers Sit to Stand: 1: +2 Total assist;With upper extremity assist;From bed Sit to Stand: Patient Percentage: 80% Stand to Sit: 1: +2 Total assist;To bed;With upper extremity assist Stand to Sit: Patient Percentage: 80% Ambulation/Gait Ambulation/Gait Assistance: 1: +2 Total assist Ambulation/Gait: Patient Percentage: 70% Ambulation Distance (Feet): 5 Feet Assistive device: Rolling walker Ambulation/Gait Assistance Details: distance limited by pain in L posterior elbow with use of RW Gait Pattern: Step-to pattern;Decreased step length - right;Decreased step length - left    ADL: ADL Grooming: Simulated;Set up Where Assessed - Grooming: Unsupported sitting Upper Body Bathing: Simulated;Minimal assistance Where Assessed - Upper Body Bathing: Unsupported sitting Lower Body Bathing: Simulated;Maximal assistance Where Assessed - Lower Body Bathing: Supported sit to stand Upper Body Dressing: Simulated;Set up Where Assessed - Upper Body Dressing: Supported sit to stand Lower Body Dressing: Simulated;Maximal assistance Where Assessed - Lower Body Dressing: Supported sit to stand Toilet Transfer: Simulated;+2 Total  assistance Toilet Transfer Method: Sit to stand ADL Comments: Pt became dizzy upon standing. BP 160/90. No s/s of vestibular issues. Pt fatigues quickly with poor activity tolerance.  Cognition: Cognition Arousal/Alertness: Awake/alert Orientation Level: Oriented X4 Cognition Overall Cognitive Status: Appears within functional limits for tasks assessed/performed Arousal/Alertness: Awake/alert Orientation Level: Appears intact for tasks assessed Behavior During Session: [facility] for tasks performed  Blood pressure 163/93, pulse 77, temperature 98.4 F (36.9 C), temperature source Oral, resp. rate 18, height 6' (1.829 m), weight 172.548 kg (380 lb 6.4 oz), SpO2 98.00%. Physical Exam  Vitals reviewed. Constitutional: He is oriented to person, place, and time.       66 year old obese male in no acute distress. Chronic Foley catheter tube in place.  HENT:  Head: Normocephalic.  Eyes: Conjunctivae and EOM are normal. Left eye exhibits no discharge. No scleral icterus.  Neck: Neck supple. No thyromegaly present.  Cardiovascular: Normal rate and regular rhythm.   Pulmonary/Chest: Breath sounds normal. He has no wheezes.  Abdominal: Bowel sounds are normal. He exhibits no distension. There is no tenderness. There is no guarding.  Musculoskeletal:       +  1 edema lower extremities.  Neurological: He is alert and oriented to person, place, and time.       RUE with 3-4/5 strength with apraxia and diminished sensation. RLE grossly 1-3+ with pain inhibition at right hip with even passive movement.  LUE and LLE within fxn limits. Impaired vertical gaze. Slow tracking in all plains however. Mild right facial weakness.  Skin:       Ischemic changes lower extremities with palpable pedal pulses.  Psychiatric: He has a normal mood and affect.    Results for orders placed during the hospital encounter of 03/27/12 (from the past 24 hour(s))  GLUCOSE, CAPILLARY     Status: Abnormal   Collection Time    03/27/12  7:26 PM      Component Value Range   Glucose-Capillary 310 (*) 70 - 99 mg/dL  CBC WITH DIFFERENTIAL     Status: Normal   Collection Time   03/27/12  7:41 PM      Component Value Range   WBC 9.0  4.0 - 10.5 K/uL   RBC 4.94  4.22 - 5.81 MIL/uL   Hemoglobin 14.9  13.0 - 17.0 g/dL   HCT 43.0  39.0 - 52.0 %   MCV 87.0  78.0 - 100.0 fL   MCH 30.2  26.0 - 34.0 pg   MCHC 34.7  30.0 - 36.0 g/dL   RDW 13.5  11.5 - 15.5 %   Platelets 211  150 - 400 K/uL   Neutrophils Relative 72  43 - 77 %   Neutro Abs 6.5  1.7 - 7.7 K/uL   Lymphocytes Relative 18  12 - 46 %   Lymphs Abs 1.6  0.7 - 4.0 K/uL   Monocytes Relative 7  3 - 12 %   Monocytes Absolute 0.6  0.1 - 1.0 K/uL   Eosinophils Relative 3  0 - 5 %   Eosinophils Absolute 0.3  0.0 - 0.7 K/uL   Basophils Relative 1  0 - 1 %   Basophils Absolute 0.1  0.0 - 0.1 K/uL  BASIC METABOLIC PANEL     Status: Abnormal   Collection Time   03/27/12  7:41 PM      Component Value Range   Sodium 132 (*) 135 - 145 mEq/L   Potassium 3.7  3.5 - 5.1 mEq/L   Chloride 96  96 - 112 mEq/L   CO2 24  19 - 32 mEq/L   Glucose, Bld 337 (*) 70 - 99 mg/dL   BUN 12  6 - 23 mg/dL   Creatinine, Ser 0.57  0.50 - 1.35 mg/dL   Calcium 8.6  8.4 - 10.5 mg/dL   GFR calc non Af Amer >90  >90 mL/min   GFR calc Af Amer >90  >90 mL/min  HEMOGLOBIN A1C     Status: Abnormal   Collection Time   03/27/12  7:45 PM      Component Value Range   Hemoglobin A1C 9.5 (*) <5.7 %   Mean Plasma Glucose 226 (*) <117 mg/dL  URINALYSIS, ROUTINE W REFLEX MICROSCOPIC     Status: Abnormal   Collection Time   03/27/12  7:48 PM      Component Value Range   Color, Urine YELLOW  YELLOW   APPearance CLEAR  CLEAR   Specific Gravity, Urine 1.020  1.005 - 1.030   pH 7.0  5.0 - 8.0   Glucose, UA >1000 (*) NEGATIVE mg/dL   Hgb urine dipstick NEGATIVE  NEGATIVE   Bilirubin  Urine NEGATIVE  NEGATIVE   Ketones, ur NEGATIVE  NEGATIVE mg/dL   Protein, ur NEGATIVE  NEGATIVE mg/dL   Urobilinogen,  UA 0.2  0.0 - 1.0 mg/dL   Nitrite POSITIVE (*) NEGATIVE   Leukocytes, UA NEGATIVE  NEGATIVE  URINE MICROSCOPIC-ADD ON     Status: Abnormal   Collection Time   03/27/12  7:48 PM      Component Value Range   Bacteria, UA MANY (*) RARE   Urine-Other FEW YEAST    GLUCOSE, CAPILLARY     Status: Abnormal   Collection Time   03/28/12  7:39 AM      Component Value Range   Glucose-Capillary 273 (*) 70 - 99 mg/dL  GLUCOSE, CAPILLARY     Status: Abnormal   Collection Time   03/28/12 11:48 AM      Component Value Range   Glucose-Capillary 240 (*) 70 - 99 mg/dL   Dg Cervical Spine Complete  03/27/2012  *RADIOLOGY REPORT*  Clinical Data: 66 year old male with fall and neck pain.  CERVICAL SPINE - COMPLETE 4+ VIEW  Comparison: 03/11/2012 CT  Findings: Due to patient body habitus and despite numerous attempts, the cervical spine is visualized only to the C5-C6 level on the lateral view. There is no definite evidence of fracture, subluxation or prevertebral soft tissue swelling. No focal bony lesions are present.  IMPRESSION: Limited evaluation of the cervical spine secondary to body habitus/position.  CT is recommended for complete evaluation.  Original Report Authenticated By: Lura Em, M.D.   Dg Thoracic Spine 2 View  03/27/2012  *RADIOLOGY REPORT*  Clinical Data: Fall with mid back pain.  THORACIC SPINE - 2 VIEW  Comparison: 10/18/2011 chest radiograph  Findings: Normal alignment is noted. Anterior wedging of T11 and T12 are unchanged. There is no evidence of acute fracture or subluxation. Diffuse osteopenia is noted.  IMPRESSION: No evidence of acute bony abnormality.  Original Report Authenticated By: Lura Em, M.D.   Dg Lumbar Spine Complete  03/27/2012  *RADIOLOGY REPORT*  Clinical Data: Fall with low back pain.  LUMBAR SPINE - COMPLETE 4+ VIEW  Comparison: Previous abdominal radiographs.  Findings: Five non-rib bearing lumbar type vertebra are identified There is no evidence of acute  fracture or subluxation. Moderate degenerative disc disease and facet arthropathy at L4-L5 noted. Anterior wedging of T11 and T12 are again noted. No spondylolysis or focal bony lesions are noted.  IMPRESSION: No evidence of acute bony abnormality.  Moderate degenerative changes at L4-L5.  Original Report Authenticated By: Lura Em, M.D.   Dg Hip Bilateral W/pelvis  03/28/2012  *RADIOLOGY REPORT*  Clinical Data: Bilateral hip pain after fall.  The  BILATERAL HIP WITH PELVIS - 4+ VIEW  Comparison: Abdomen 03/12/2012  Findings: Degenerative changes in the lower lumbar spine and hips. Pelvis and hips appear otherwise intact.  No displaced fractures are identified.  No focal bone lesion or bone destruction. Symphysis pubis and SI joints are not displaced.  IMPRESSION: Degenerative changes in the hips.  No acute fractures appreciated.  Original Report Authenticated By: Neale Burly, M.D.   Ct Head Wo Contrast  03/27/2012  *RADIOLOGY REPORT*  Clinical Data:  Patient found down, occipital headache and neck pain  CT HEAD WITHOUT CONTRAST CT CERVICAL SPINE WITHOUT CONTRAST  Technique:  Multidetector CT imaging of the head and cervical spine was performed following the standard protocol without intravenous contrast.  Multiplanar CT image reconstructions of the cervical spine were also generated.  Comparison:  Head  CT 03/10/2012  CT HEAD  Findings: Basal ganglial hypodensity, likely calcification, is stable.  Stable mild cortical volume loss with proportional ventricular prominence. No acute hemorrhage, acute infarction, or mass lesion is seen.  Orbits and paranasal sinuses are unremarkable with the exception of minimal ethmoid mucoperiosteal thickening. No skull fracture.  IMPRESSION: No new acute intracranial finding.  CT CERVICAL SPINE  Findings: C1 through the cervical thoracic junction is visualized in its entirety.  Normal alignment. No precervical soft tissue widening is present.  No fracture or  dislocation. Curvilinear biapical scarring or atelectasis.  IMPRESSION: No acute abnormality of the cervical spine.  Original Report Authenticated By: Arline Asp, M.D.   Ct Cervical Spine Wo Contrast  03/27/2012  *RADIOLOGY REPORT*  Clinical Data:  Patient found down, occipital headache and neck pain  CT HEAD WITHOUT CONTRAST CT CERVICAL SPINE WITHOUT CONTRAST  Technique:  Multidetector CT imaging of the head and cervical spine was performed following the standard protocol without intravenous contrast.  Multiplanar CT image reconstructions of the cervical spine were also generated.  Comparison:  Head CT 03/10/2012  CT HEAD  Findings: Basal ganglial hypodensity, likely calcification, is stable.  Stable mild cortical volume loss with proportional ventricular prominence. No acute hemorrhage, acute infarction, or mass lesion is seen.  Orbits and paranasal sinuses are unremarkable with the exception of minimal ethmoid mucoperiosteal thickening. No skull fracture.  IMPRESSION: No new acute intracranial finding.  CT CERVICAL SPINE  Findings: C1 through the cervical thoracic junction is visualized in its entirety.  Normal alignment. No precervical soft tissue widening is present.  No fracture or dislocation. Curvilinear biapical scarring or atelectasis.  IMPRESSION: No acute abnormality of the cervical spine.  Original Report Authenticated By: Arline Asp, M.D.    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 Does the need for close, 24 hr/day medical supervision in concert with the patient's rehab needs make it unreasonable for this patient to be served in a less intensive setting? Potentially 2. 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 Due to bladder management, bowel management, safety, skin/wound care, disease management, medication administration, pain management and patient education, does the patient  require 24 hr/day rehab nursing? Potentially 4. Does the patient require coordinated care of a physician, rehab nurse, PT (1-2 hrs/day, 5 days/week) and OT (1-2 hrs/day, 5 days/week) to address physical and functional deficits in the context of the above medical diagnosis(es)? Potentially Addressing deficits in the following areas: balance, endurance, locomotion, strength, transferring, bowel/bladder control, bathing, dressing, feeding, grooming, toileting and psychosocial support 5. Can the patient actively participate in an intensive therapy program of at least 3 hrs of therapy per day at least 5 days per week? Potentially 6. The potential for patient to make measurable gains while on inpatient rehab is excellent 7. Anticipated functional outcomes upon discharge from inpatient rehab are ?supervision to Mod I with PT, supervision to mod I? with OT, . 8. Estimated rehab length of stay to reach the above functional goals is: ?To be determined 9. Does the patient have adequate social supports to accommodate these discharge functional goals? No 10. Anticipated D/C setting: Home 11. Anticipated post D/C treatments: [place] therapy 12. Overall Rehab/Functional Prognosis: good  RECOMMENDATIONS: This patient's condition is appropriate for continued rehabilitative care in the following setting: SNF vs CIR Patient has agreed to participate in recommended program. Potentially Note that insurance prior authorization may be required for reimbursement for recommended care.  Comment: The patient tells  me that he had been getting along quite well at home before this latest admission. He appears to have absolutely no social supports. The question here is whether there are real, long-term safety issues that require a more secure living situation (ie with family or ALF). Financial considerations are also prominent according to the patient as he has a debt due to his recent medical care.   As he was just admitted  yesterday, I would like to see if he improves with management of his medical issues before we make any decisions regarding future rehab. Rehab Rn to follow up.  Oval Linsey, MD    03/28/2012

## 2012-03-28 NOTE — Evaluation (Signed)
Physical Therapy Evaluation Patient Details Name: [name] MRN: [id number] DOB: 07/20/1946 Today's Date: 03/28/2012 Time: [id number] PT Time Calculation (min): 31 min  PT Assessment / Plan / Recommendation Clinical Impression  66 y.o. male admitted with visual disturbance. 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 Prior to this admission pt had absent sensation B feet, R foot drop, and decreased strength/coordination with R hand, but was independent with mobility/self care. Pt now presenting with visual deficits, unable to track, and decreased peripheral vision. Pt ambulated 5' with RW and assist of 2 for balance/safety.  Mobility limited by RLE and LUE pain.CIR vs. SNF recommended as pt lives alone. PT concerned about financial situation. He stated he already has $10,000 of unpaid medical bills.     PT Assessment  Patient needs continued PT services    Follow Up Recommendations  Inpatient Rehab    Barriers to Discharge Decreased caregiver support      Equipment Recommendations  Defer to next venue    Recommendations for Other Services Rehab consult;OT consult   Frequency Min 5X/week    Precautions / Restrictions Precautions Precautions: Fall   Pertinent Vitals/Pain *8/10 at rest R hip prior to pain meds 1/10 after pain meds Pt also reported pain/popping in L posterior elbow with supine to sit, RN notified**      Mobility  Bed Mobility Bed Mobility: Supine to Sit;Sit to Supine Supine to Sit: 1: +2 Total assist;With rails;HOB elevated Supine to Sit: Patient Percentage: 80% Sit to Supine: 1: +2 Total assist;With rail;HOB elevated Sit to Supine: Patient Percentage: 40% Details for Bed Mobility Assistance: cues for hand placement, sequencing and technique. Pt c/o L elbow pain following supine>sit. RN made aware. For sit>supine, pt required physical A for LEs and trunk. Transfers Sit to Stand: 1: +2 Total assist;With upper extremity assist;From bed Sit to Stand:  Patient Percentage: 80% Stand to Sit: 1: +2 Total assist;To bed;With upper extremity assist Stand to Sit: Patient Percentage: 80% Details for Transfer Assistance: min cues for hand placement. Pt reports dizziness upon standing. BP assessed, no signicant drop compared to sitting.  Ambulation/Gait Ambulation/Gait Assistance: 1: +2 Total assist Ambulation/Gait: Patient Percentage: 70% Ambulation Distance (Feet): 5 Feet Assistive device: Rolling walker Ambulation/Gait Assistance Details: distance limited by pain in L posterior elbow with use of RW Gait Pattern: Step-to pattern;Decreased step length - right;Decreased step length - left    Exercises     PT Diagnosis: Difficulty walking;Acute pain;Hemiplegia dominant side  PT Problem List: Decreased strength;Decreased activity tolerance;Decreased mobility;Obesity;Pain;Decreased knowledge of use of DME PT Treatment Interventions: DME instruction;Gait training;Functional mobility training;Therapeutic activities;Therapeutic exercise;Patient/family education   PT Goals Acute Rehab PT Goals PT Goal Formulation: With patient Time For Goal Achievement: 04/11/12 Potential to Achieve Goals: Fair Pt will go Supine/Side to Sit: with min assist;with HOB 0 degrees PT Goal: Supine/Side to Sit - Progress: Goal set today Pt will go Sit to Stand: with min assist;with upper extremity assist PT Goal: Sit to Stand - Progress: Goal set today Pt will Ambulate: 16 - 50 feet;with min assist;with rolling walker PT Goal: Ambulate - Progress: Goal set today  Visit Information  Last PT Received On: 03/28/12 Assistance Needed: +2    Subjective Data  Subjective: I didn't do any therapy after my stroke bc of the high co-pay.  Patient Stated Goal: return to drawing sketches   Prior [place] Lives With: Alone Type of Home: House Home Access: Stairs to enter [employer] of Steps:  1 Home Layout: One level Bathroom Shower/Tub: Scientist, forensic: Handicapped height Home Adaptive Equipment: Shower chair with back;Grab bars in shower;Grab bars around toilet;Straight cane;Quad cane;Walker - standard Prior Function Level of Independence: Independent with assistive device(s) Driving: Yes Vocation: Retired Corporate investment banker: No difficulties Dominant Hand: Right    Cognition  Overall Cognitive Status: Appears within functional limits for tasks assessed/performed Arousal/Alertness: Awake/alert Orientation Level: Appears intact for tasks assessed Behavior During Session: [facility] for tasks performed    Extremity/Trunk Assessment Right Upper Extremity Assessment RUE ROM/Strength/Tone: Deficits RUE ROM/Strength/Tone Deficits: AROM WFL strength 4-/5 weak grip. RUE Sensation: Deficits RUE Sensation Deficits: Pt lacks sensation in thumb and first two digits. RUE Coordination: Deficits RUE Coordination Deficits: Difficulty with opposition, although pt stated he is able to sketch with his right hand. Pt states he does frequently drop utensils, pencils and ADL items. 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, R hip painful with movement, AAROM grossly WFL RLE Sensation: Deficits;History of peripheral neuropathy RLE Sensation Deficits: absent to light touch in foot Left Lower Extremity Assessment LLE ROM/Strength/Tone: WFL for tasks assessed LLE Sensation: Deficits;History of peripheral neuropathy LLE Sensation Deficits: absent to light touch in foot LLE Coordination: WFL - gross/fine motor Trunk Assessment Trunk Assessment: Normal   Balance Balance Balance Assessed: Yes Static Sitting Balance Static Sitting -  Balance Support: No upper extremity supported;Feet supported Static Sitting - Level of Assistance: 5: Stand by assistance Static Standing Balance Static Standing - Balance Support: Bilateral upper extremity supported;During functional activity Static Standing - Level of Assistance: 4: Min assist  End of Session PT - End of Session Equipment Utilized During Treatment: Gait belt Activity Tolerance: Patient limited by pain Patient left: in bed;with call bell/phone within reach Nurse Communication: Mobility status  GP     Blondell Reveal Kistler 03/28/2012, 11:29 AM [phone]

## 2012-03-28 NOTE — Progress Notes (Signed)
[facility] PROGRESS NOTE  [name] [id number] DOB: 1946-07-28 DOA: 03/27/2012 PCP: Irven Shelling, MD  Assessment/Plan: 1-Fall: patient reports blacking out; might be due to elevated CBG's; underlying UTI vs TIA or arrythmia. Will check telemetry, CT head negative and patient do not fit on MRI machine; might benefit of second CT 48 hours after admission to r/o stroke. Will check B12 level and will control CBG's. Patient started on cipro; urine cx ordered.  2-HTN:well controlled; will continue current regimen.  3-DM: uncontrolled, A1C 9.4; continue SSI, lantus and low car diet.  4-MSK pain:will check knee x-ray; continue pain meds and will start patient on flexeril for muscle relaxation. Hip x-ray and spine CT negative for acute fractures.  5-presumed UTI: will follow urine cx; continue cipro for now (patient might be colonized with his chronic indwelling catheter)  6- GERd: continue PPI  DVT:SCD's  Code Status: Full Family Communication: no family present; patient AAOX3 and updated on plan; questions answered Disposition Plan: most likely home when stable; but will ask PT to evaluate to make sure no additional assistance is required and that he will be safe home alone at discharge.   Brief narrative: 66 y/o male admitted to the hospital after experiencing fall episode at home. Patient reports that he black out while putting groceries away. Denies CP, palpitations or lightheadedness before falling. Endorses vision disturbance (blurred vision) and also right side weakness after the event. In the ED CT head negative for acute intracranial process.  Consultants:  None  Antibiotics:  ciprofloxacin  HPI/Subjective: Feeling better; Reports vision has improved. Denies CP, SOB, abdominal pain, Nausea and vomiting.  Objective: Filed Vitals:   03/27/12 1856 03/27/12 2242 03/28/12 0323 03/28/12 0632  BP: 156/97 157/74 147/81 153/85  Pulse: 91 87 76 67  Temp: 97.7  F (36.5 C)  98.3 F (36.8 C) 98.4 F (36.9 C)  TempSrc: Oral  Oral Oral  Resp: 22 20 20 18   Height:   6' (1.829 m)   Weight:   172.548 kg (380 lb 6.4 oz)   SpO2: 100% 95% 94% 97%    Intake/Output Summary (Last 24 hours) at 03/28/12 0959 Last data filed at 03/28/12 0634  Gross per 24 hour  Intake      0 ml  Output   1275 ml  Net  -1275 ml    Exam:   General:  NAD; cooperative to examination. Reports vision is improved  Cardiovascular: RRR, no rubs or gallops  Respiratory: CTA  Abdomen: soft, NT, ND; positive bowel sounds  MSK: complaining of right hip and right knee pain after fall; pain worse with movements; no swelling. Internal rotation makes his hip pain worse.   Data Reviewed: Basic Metabolic Panel:  Lab [id number] 1941  NA 132*  K 3.7  CL 96  CO2 24  GLUCOSE 337*  BUN 12  CREATININE 0.57  CALCIUM 8.6  MG --  PHOS --   CBC:  Lab 03/27/12 1941  WBC 9.0  NEUTROABS 6.5  HGB 14.9  HCT 43.0  MCV 87.0  PLT 211   BNP (last 3 results)  Basename 11/12/11 1007 10/19/11 1010  PROBNP 675.3* 535.2*   CBG:  Lab 03/28/12 0739 03/27/12 1926  GLUCAP 273* 310*     Studies: Ct Angio Head W/cm &/or Wo Cm  03/11/2012  *RADIOLOGY REPORT*  Clinical Data:  Head and right-sided weakness.  History of prior CVA.  CT ANGIOGRAPHY HEAD  Technique:  Multidetector CT imaging of the head was performed using  the standard protocol during bolus administration of intravenous contrast.  Multiplanar CT image reconstructions including MIPs were obtained to evaluate the vascular anatomy.  Contrast: 157mL OMNIPAQUE IOHEXOL 350 MG/ML SOLN  Comparison:  CT head without contrast 03/10/2012 and 10/21/2011.  Findings:  Calcifications in the globus pallidus and thalami are stable bilaterally.  Mild generalized atrophy is evident.  The subcortical white matter hypoattenuation is unchanged.  No acute cortical infarct, hemorrhage, or mass lesion is present.  The postcontrast images demonstrate  to no pathologic enhancement. The paranasal sinuses and mastoid air cells are clear.  The source images demonstrate no evidence for acute infarcts.  The internal carotid arteries are within normal limits bilaterally. The A1 and M1 segments are normal.  The MCA bifurcations are normal bilaterally.  The ACA and MCA branch vessels demonstrate some distal attenuation without significant proximal stenosis, aneurysm, or branch vessel occlusion.  The left vertebral artery is the dominant vessel.  There is minimal calcification at the dural margin.  The basilar artery is within normal limits.  Both posterior cerebral arteries originate from the basilar tip.  The PCA branch vessels, demonstrates some distal attenuation.   Review of the MIP images confirms the above findings.  IMPRESSION:  1.  Mild distal small vessel disease. 2.  No significant proximal stenosis, aneurysm, or branch vessel occlusion. 3.  Stable basal ganglia calcifications. 4.  No evidence for acute infarct.  Original Report Authenticated By: Resa Miner. MATTERN, M.D.   Dg Chest 2 View  03/11/2012  *RADIOLOGY REPORT*  Clinical Data: Right arm pain.  CHEST - 2 VIEW  Comparison: 11/12/2011  Findings: Cardiomegaly with mild vascular congestion.  No overt edema.  No focal opacities or effusions.  No acute bony abnormality.  IMPRESSION: Cardiomegaly, vascular congestion.  Original Report Authenticated By: Raelyn Number, M.D.   Dg Cervical Spine Complete  03/27/2012  *RADIOLOGY REPORT*  Clinical Data: 66 year old male with fall and neck pain.  CERVICAL SPINE - COMPLETE 4+ VIEW  Comparison: 03/11/2012 CT  Findings: Due to patient body habitus and despite numerous attempts, the cervical spine is visualized only to the C5-C6 level on the lateral view. There is no definite evidence of fracture, subluxation or prevertebral soft tissue swelling. No focal bony lesions are present.  IMPRESSION: Limited evaluation of the cervical spine secondary to body  habitus/position.  CT is recommended for complete evaluation.  Original Report Authenticated By: Lura Em, M.D.   Dg Thoracic Spine 2 View  03/27/2012  *RADIOLOGY REPORT*  Clinical Data: Fall with mid back pain.  THORACIC SPINE - 2 VIEW  Comparison: 10/18/2011 chest radiograph  Findings: Normal alignment is noted. Anterior wedging of T11 and T12 are unchanged. There is no evidence of acute fracture or subluxation. Diffuse osteopenia is noted.  IMPRESSION: No evidence of acute bony abnormality.  Original Report Authenticated By: Lura Em, M.D.   Dg Lumbar Spine Complete  03/27/2012  *RADIOLOGY REPORT*  Clinical Data: Fall with low back pain.  LUMBAR SPINE - COMPLETE 4+ VIEW  Comparison: Previous abdominal radiographs.  Findings: Five non-rib bearing lumbar type vertebra are identified There is no evidence of acute fracture or subluxation. Moderate degenerative disc disease and facet arthropathy at L4-L5 noted. Anterior wedging of T11 and T12 are again noted. No spondylolysis or focal bony lesions are noted.  IMPRESSION: No evidence of acute bony abnormality.  Moderate degenerative changes at L4-L5.  Original Report Authenticated By: Lura Em, M.D.   Dg Hip Bilateral W/pelvis  03/28/2012  *  RADIOLOGY REPORT*  Clinical Data: Bilateral hip pain after fall.  The  BILATERAL HIP WITH PELVIS - 4+ VIEW  Comparison: Abdomen 03/12/2012  Findings: Degenerative changes in the lower lumbar spine and hips. Pelvis and hips appear otherwise intact.  No displaced fractures are identified.  No focal bone lesion or bone destruction. Symphysis pubis and SI joints are not displaced.  IMPRESSION: Degenerative changes in the hips.  No acute fractures appreciated.  Original Report Authenticated By: Neale Burly, M.D.   Dg Abd 1 View  03/12/2012  *RADIOLOGY REPORT*  Clinical Data: Constipation  ABDOMEN - 1 VIEW  Comparison: None  Findings: Multiple surgical clips are identified within the upper abdomen.  There  is gas identified within the colon and rectum.  No dilated loops of small bowel or air-fluid levels.  IMPRESSION:  1.  Nonobstructive bowel gas pattern  Original Report Authenticated By: Angelita Ingles, M.D.   Ct Head Wo Contrast  03/27/2012  *RADIOLOGY REPORT*  Clinical Data:  Patient found down, occipital headache and neck pain  CT HEAD WITHOUT CONTRAST CT CERVICAL SPINE WITHOUT CONTRAST  Technique:  Multidetector CT imaging of the head and cervical spine was performed following the standard protocol without intravenous contrast.  Multiplanar CT image reconstructions of the cervical spine were also generated.  Comparison:  Head CT 03/10/2012  CT HEAD  Findings: Basal ganglial hypodensity, likely calcification, is stable.  Stable mild cortical volume loss with proportional ventricular prominence. No acute hemorrhage, acute infarction, or mass lesion is seen.  Orbits and paranasal sinuses are unremarkable with the exception of minimal ethmoid mucoperiosteal thickening. No skull fracture.  IMPRESSION: No new acute intracranial finding.  CT CERVICAL SPINE  Findings: C1 through the cervical thoracic junction is visualized in its entirety.  Normal alignment. No precervical soft tissue widening is present.  No fracture or dislocation. Curvilinear biapical scarring or atelectasis.  IMPRESSION: No acute abnormality of the cervical spine.  Original Report Authenticated By: Arline Asp, M.D.   Ct Head Wo Contrast  03/11/2012  *RADIOLOGY REPORT*  Clinical Data: Headache.  CT HEAD WITHOUT CONTRAST  Technique:  Contiguous axial images were obtained from the base of the skull through the vertex without contrast.  Comparison: 10/21/2011  Findings: Stable physiologic calcifications within the basal ganglia and thalami bilaterally. No acute intracranial abnormality. Specifically, no hemorrhage, hydrocephalus, mass lesion, acute infarction, or significant intracranial injury.  No acute calvarial abnormality. Visualized  paranasal sinuses and mastoids clear. Orbital soft tissues unremarkable.  IMPRESSION: No acute intracranial abnormality.  Original Report Authenticated By: Raelyn Number, M.D.   Ct Cervical Spine Wo Contrast  03/27/2012  *RADIOLOGY REPORT*  Clinical Data:  Patient found down, occipital headache and neck pain  CT HEAD WITHOUT CONTRAST CT CERVICAL SPINE WITHOUT CONTRAST  Technique:  Multidetector CT imaging of the head and cervical spine was performed following the standard protocol without intravenous contrast.  Multiplanar CT image reconstructions of the cervical spine were also generated.  Comparison:  Head CT 03/10/2012  CT HEAD  Findings: Basal ganglial hypodensity, likely calcification, is stable.  Stable mild cortical volume loss with proportional ventricular prominence. No acute hemorrhage, acute infarction, or mass lesion is seen.  Orbits and paranasal sinuses are unremarkable with the exception of minimal ethmoid mucoperiosteal thickening. No skull fracture.  IMPRESSION: No new acute intracranial finding.  CT CERVICAL SPINE  Findings: C1 through the cervical thoracic junction is visualized in its entirety.  Normal alignment. No precervical soft tissue widening is present.  No fracture or dislocation. Curvilinear biapical scarring or atelectasis.  IMPRESSION: No acute abnormality of the cervical spine.  Original Report Authenticated By: Arline Asp, M.D.   Dg Abd 2 Views  03/11/2012  *RADIOLOGY REPORT*  Clinical Data: Upper abdominal pain.  ABDOMEN - 2 VIEW  Comparison: CT 04/19/2010  Findings: Large stool burden in the colon, particularly the right side of the colon.  No evidence of bowel obstruction or free air. No organomegaly.  Prior cholecystectomy.  No acute bony abnormality.  IMPRESSION: A large stool burden in the right side of the colon.  No obstruction or free air.  Original Report Authenticated By: Raelyn Number, M.D.    Scheduled Meds:   . aspirin EC  81 mg Oral Daily  .  bisacodyl  10 mg Oral Once  . ciprofloxacin  400 mg Intravenous Once  . ciprofloxacin  500 mg Oral BID  . clopidogrel  75 mg Oral Q breakfast  . cyclobenzaprine  5 mg Oral TID  . furosemide  40 mg Oral Daily  .  HYDROmorphone (DILAUDID) injection  1 mg Intravenous Once  . insulin aspart  0-5 Units Subcutaneous QHS  . insulin aspart  0-9 Units Subcutaneous TID WC  . insulin aspart protamine-insulin aspart  50 Units Subcutaneous BID WC  . isosorbide mononitrate  30 mg Oral Daily  . metoprolol tartrate  50 mg Oral BID  . ondansetron  4 mg Intravenous Once  . oxyCODONE-acetaminophen  1 tablet Oral Once  . pantoprazole  40 mg Oral Q1200  . ramipril  5 mg Oral Daily  . sodium chloride  500 mL Intravenous Once  . sodium chloride  3 mL Intravenous Q12H  . DISCONTD: insulin aspart  0-5 Units Subcutaneous QHS  . DISCONTD: insulin aspart  0-9 Units Subcutaneous TID WC   Continuous Infusions:   . sodium chloride    . DISCONTD: sodium chloride 125 mL/hr at 03/27/12 2322     Time spent: >30 minutes    Lismary Kiehn  [facility] Pager [phone]. If 8PM-8AM, please contact night-coverage at [email or web address], password [facility] 03/28/2012, 9:59 AM  LOS: 1 day

## 2012-03-28 NOTE — H&P (Addendum)
[name] is an 66 y.o. male.    Pcp: Lavone Orn  Chief Complaint: fall, couldn't get up HPI: 66 yo male with dm2, morbid obeisity, hypertension, Hyperlipidemia, sleep apnea apparently fell at home while putting away groceries.  Pt was weak and unable to get up.  and called EMS and brought to ED for evaluation. Pt c/o blurred vision.  CT brain negative for any acute process.  CT c-spine negative for fracture. Xray hip =>negative for fracture. bs >300 Pt notes that he has had blurred vision with high bs in the past.  Pt will be admitted for observation for hyperglycemia.   Past Medical History  Diagnosis Date  . Obese   . Hypertension   . Diabetes mellitus   . Diabetic diarrhea   . Sleep apnea   . Diabetic neuropathy, painful   . Chronic indwelling foley catheter   . Bladder spasms   . Complication of anesthesia   . PONV (postoperative nausea and vomiting)   . Hyperlipidemia   . GERD (gastroesophageal reflux disease)     Past Surgical History  Procedure Date  . Right tkr   . Gastroplasty   . Cholecystectomy   . Cardiac catheterization     Family History  Problem Relation Age of Onset  . Hypertension Mother   . Diabetes type I Father   . Cancer Brother      Testicular Cancer  . Cancer Brother     Leukemia   Social History:  reports that he quit smoking about 35 years ago. He has never used smokeless tobacco. He reports that he does not drink alcohol or use illicit drugs.  Allergies:  Allergies  Allergen Reactions  . Ace Inhibitors Swelling  . Cefadroxil Hives  . Cephalexin Hives  . Lipitor (Atorvastatin Calcium) Swelling  . Metformin And Related Swelling  . Methocarbamol Other (See Comments)    Feels like is in "rocky boat"  . Morphine And Related Other (See Comments)    Sweating, feels like is "in rocky boat."     (Not in a hospital admission)  Results for orders placed during the hospital encounter of 03/27/12 (from the past 48 hour(s))  GLUCOSE,  CAPILLARY     Status: Abnormal   Collection Time   03/27/12  7:26 PM      Component Value Range Comment   Glucose-Capillary 310 (*) 70 - 99 mg/dL   CBC WITH DIFFERENTIAL     Status: Normal   Collection Time   03/27/12  7:41 PM      Component Value Range Comment   WBC 9.0  4.0 - 10.5 K/uL    RBC 4.94  4.22 - 5.81 MIL/uL    Hemoglobin 14.9  13.0 - 17.0 g/dL    HCT 43.0  39.0 - 52.0 %    MCV 87.0  78.0 - 100.0 fL    MCH 30.2  26.0 - 34.0 pg    MCHC 34.7  30.0 - 36.0 g/dL    RDW 13.5  11.5 - 15.5 %    Platelets 211  150 - 400 K/uL    Neutrophils Relative 72  43 - 77 %    Neutro Abs 6.5  1.7 - 7.7 K/uL    Lymphocytes Relative 18  12 - 46 %    Lymphs Abs 1.6  0.7 - 4.0 K/uL    Monocytes Relative 7  3 - 12 %    Monocytes Absolute 0.6  0.1 - 1.0 K/uL  Eosinophils Relative 3  0 - 5 %    Eosinophils Absolute 0.3  0.0 - 0.7 K/uL    Basophils Relative 1  0 - 1 %    Basophils Absolute 0.1  0.0 - 0.1 K/uL   BASIC METABOLIC PANEL     Status: Abnormal   Collection Time   03/27/12  7:41 PM      Component Value Range Comment   Sodium 132 (*) 135 - 145 mEq/L    Potassium 3.7  3.5 - 5.1 mEq/L    Chloride 96  96 - 112 mEq/L    CO2 24  19 - 32 mEq/L    Glucose, Bld 337 (*) 70 - 99 mg/dL    BUN 12  6 - 23 mg/dL    Creatinine, Ser 0.57  0.50 - 1.35 mg/dL    Calcium 8.6  8.4 - 10.5 mg/dL    GFR calc non Af Amer >90  >90 mL/min    GFR calc Af Amer >90  >90 mL/min   URINALYSIS, ROUTINE W REFLEX MICROSCOPIC     Status: Abnormal   Collection Time   03/27/12  7:48 PM      Component Value Range Comment   Color, Urine YELLOW  YELLOW    APPearance CLEAR  CLEAR    Specific Gravity, Urine 1.020  1.005 - 1.030    pH 7.0  5.0 - 8.0    Glucose, UA >1000 (*) NEGATIVE mg/dL    Hgb urine dipstick NEGATIVE  NEGATIVE    Bilirubin Urine NEGATIVE  NEGATIVE    Ketones, ur NEGATIVE  NEGATIVE mg/dL    Protein, ur NEGATIVE  NEGATIVE mg/dL    Urobilinogen, UA 0.2  0.0 - 1.0 mg/dL    Nitrite POSITIVE (*) NEGATIVE     Leukocytes, UA NEGATIVE  NEGATIVE   URINE MICROSCOPIC-ADD ON     Status: Abnormal   Collection Time   03/27/12  7:48 PM      Component Value Range Comment   Bacteria, UA MANY (*) RARE    Urine-Other FEW YEAST      Dg Cervical Spine Complete  03/27/2012  *RADIOLOGY REPORT*  Clinical Data: 66 year old male with fall and neck pain.  CERVICAL SPINE - COMPLETE 4+ VIEW  Comparison: 03/11/2012 CT  Findings: Due to patient body habitus and despite numerous attempts, the cervical spine is visualized only to the C5-C6 level on the lateral view. There is no definite evidence of fracture, subluxation or prevertebral soft tissue swelling. No focal bony lesions are present.  IMPRESSION: Limited evaluation of the cervical spine secondary to body habitus/position.  CT is recommended for complete evaluation.  Original Report Authenticated By: Lura Em, M.D.   Dg Thoracic Spine 2 View  03/27/2012  *RADIOLOGY REPORT*  Clinical Data: Fall with mid back pain.  THORACIC SPINE - 2 VIEW  Comparison: 10/18/2011 chest radiograph  Findings: Normal alignment is noted. Anterior wedging of T11 and T12 are unchanged. There is no evidence of acute fracture or subluxation. Diffuse osteopenia is noted.  IMPRESSION: No evidence of acute bony abnormality.  Original Report Authenticated By: Lura Em, M.D.   Dg Lumbar Spine Complete  03/27/2012  *RADIOLOGY REPORT*  Clinical Data: Fall with low back pain.  LUMBAR SPINE - COMPLETE 4+ VIEW  Comparison: Previous abdominal radiographs.  Findings: Five non-rib bearing lumbar type vertebra are identified There is no evidence of acute fracture or subluxation. Moderate degenerative disc disease and facet arthropathy at L4-L5 noted. Anterior wedging of  T11 and T12 are again noted. No spondylolysis or focal bony lesions are noted.  IMPRESSION: No evidence of acute bony abnormality.  Moderate degenerative changes at L4-L5.  Original Report Authenticated By: Lura Em, M.D.   Ct  Head Wo Contrast  03/27/2012  *RADIOLOGY REPORT*  Clinical Data:  Patient found down, occipital headache and neck pain  CT HEAD WITHOUT CONTRAST CT CERVICAL SPINE WITHOUT CONTRAST  Technique:  Multidetector CT imaging of the head and cervical spine was performed following the standard protocol without intravenous contrast.  Multiplanar CT image reconstructions of the cervical spine were also generated.  Comparison:  Head CT 03/10/2012  CT HEAD  Findings: Basal ganglial hypodensity, likely calcification, is stable.  Stable mild cortical volume loss with proportional ventricular prominence. No acute hemorrhage, acute infarction, or mass lesion is seen.  Orbits and paranasal sinuses are unremarkable with the exception of minimal ethmoid mucoperiosteal thickening. No skull fracture.  IMPRESSION: No new acute intracranial finding.  CT CERVICAL SPINE  Findings: C1 through the cervical thoracic junction is visualized in its entirety.  Normal alignment. No precervical soft tissue widening is present.  No fracture or dislocation. Curvilinear biapical scarring or atelectasis.  IMPRESSION: No acute abnormality of the cervical spine.  Original Report Authenticated By: Arline Asp, M.D.   Ct Cervical Spine Wo Contrast  03/27/2012  *RADIOLOGY REPORT*  Clinical Data:  Patient found down, occipital headache and neck pain  CT HEAD WITHOUT CONTRAST CT CERVICAL SPINE WITHOUT CONTRAST  Technique:  Multidetector CT imaging of the head and cervical spine was performed following the standard protocol without intravenous contrast.  Multiplanar CT image reconstructions of the cervical spine were also generated.  Comparison:  Head CT 03/10/2012  CT HEAD  Findings: Basal ganglial hypodensity, likely calcification, is stable.  Stable mild cortical volume loss with proportional ventricular prominence. No acute hemorrhage, acute infarction, or mass lesion is seen.  Orbits and paranasal sinuses are unremarkable with the exception of  minimal ethmoid mucoperiosteal thickening. No skull fracture.  IMPRESSION: No new acute intracranial finding.  CT CERVICAL SPINE  Findings: C1 through the cervical thoracic junction is visualized in its entirety.  Normal alignment. No precervical soft tissue widening is present.  No fracture or dislocation. Curvilinear biapical scarring or atelectasis.  IMPRESSION: No acute abnormality of the cervical spine.  Original Report Authenticated By: Arline Asp, M.D.    Review of Systems  Constitutional: Negative for fever, chills, weight loss, malaise/fatigue and diaphoresis.  HENT: Negative for hearing loss, ear pain, nosebleeds, congestion, neck pain, tinnitus and ear discharge.   Eyes: Positive for blurred vision. Negative for double vision, photophobia, pain, discharge and redness.  Respiratory: Negative for cough, hemoptysis, sputum production, shortness of breath and wheezing.   Cardiovascular: Negative for chest pain, palpitations, orthopnea, claudication, leg swelling and PND.  Gastrointestinal: Negative for heartburn, nausea, vomiting, abdominal pain, diarrhea, constipation, blood in stool and melena.  Genitourinary: Negative for dysuria, urgency, frequency, hematuria and flank pain.  Musculoskeletal: Negative for myalgias, back pain, joint pain and falls.  Skin: Negative for itching and rash.  Neurological: Negative for dizziness, tingling, tremors, sensory change, speech change, focal weakness, seizures, loss of consciousness, weakness and headaches.  Endo/Heme/Allergies: Negative for environmental allergies and polydipsia. Does not bruise/bleed easily.  Psychiatric/Behavioral: Negative for depression, suicidal ideas, hallucinations, memory loss and substance abuse. The patient is not nervous/anxious and does not have insomnia.     Blood pressure 157/74, pulse 87, temperature 97.7 F (36.5 C), temperature source  Oral, resp. rate 20, SpO2 95.00%. Physical Exam  Constitutional: He is  oriented to person, place, and time. He appears well-developed and well-nourished. No distress.  HENT:  Head: Normocephalic and atraumatic.  Right Ear: External ear normal.  Left Ear: External ear normal.  Mouth/Throat: No oropharyngeal exudate.  Eyes: Conjunctivae and EOM are normal. Pupils are equal, round, and reactive to light. Right eye exhibits no discharge. Left eye exhibits no discharge. No scleral icterus.  Neck: Normal range of motion. Neck supple. No JVD present. No tracheal deviation present. No thyromegaly present.  Cardiovascular: Normal rate and regular rhythm.  Exam reveals no gallop and no friction rub.   No murmur heard. Respiratory: Effort normal and breath sounds normal. No stridor. No respiratory distress. He has no wheezes. He has no rales. He exhibits no tenderness.  GI: Soft. Bowel sounds are normal. He exhibits no distension and no mass. There is no tenderness. There is no rebound and no guarding.  Musculoskeletal: Normal range of motion. He exhibits no edema and no tenderness.  Lymphadenopathy:    He has no cervical adenopathy.  Neurological: He is alert and oriented to person, place, and time. He has normal reflexes. He displays normal reflexes. No cranial nerve deficit. He exhibits normal muscle tone. Coordination normal.       Vision improving  Skin: Skin is warm and dry. No rash noted. He is not diaphoretic. No erythema. No pallor.       Onychomycosis, hammer toes, callus on feet bil, dry skinover shins  Psychiatric: He has a normal mood and affect. His behavior is normal. Judgment and thought content normal.     Assessment/Plan Fall Hyperglycemia Blurred vision (vision disturbance) Hip pain  Admit observation neuro checks Hydrate with normal saline, fsbs q4h iss Xray hips Hopefully blurred vision will improve as sugar improves    Miah Boye 03/28/2012, 1:01 AM

## 2012-03-28 NOTE — Plan of Care (Signed)
Problem: Consults Goal: Diagnosis-Diabetes Mellitus Hyperglycemia     

## 2012-03-28 NOTE — Evaluation (Signed)
Occupational Therapy Evaluation Patient Details Name: [name] MRN: [id number] DOB: 27-Feb-1946 Today's Date: 03/28/2012 Time: [id number] OT Time Calculation (min): 31 min  OT Assessment / Plan / Recommendation Clinical Impression  Pt is a 66 yo male who presents with new onset of blurriness of vision, falls at home. Skilled OT indicated to maximize independence with BADLs to min A level in prep for d/c to next venue of care.    OT Assessment  Patient needs continued OT Services    Follow Up Recommendations  Inpatient Rehab;Skilled nursing facility    Barriers to Discharge Inaccessible home environment;Decreased caregiver support    Equipment Recommendations  Defer to next venue    Recommendations for Other Services Rehab consult  Frequency  Min 2X/week    Precautions / Restrictions Precautions Precautions: Fall   Pertinent Vitals/Pain BP 160/90 in standing, sitting 163/93. Dizziness reported.    ADL  Grooming: Simulated;Set up Where Assessed - Grooming: Unsupported sitting Upper Body Bathing: Simulated;Minimal assistance Where Assessed - Upper Body Bathing: Unsupported sitting Lower Body Bathing: Simulated;Maximal assistance Where Assessed - Lower Body Bathing: Supported sit to stand Upper Body Dressing: Simulated;Set up Where Assessed - Upper Body Dressing: Supported sit to stand Lower Body Dressing: Simulated;Maximal assistance Where Assessed - Lower Body Dressing: Supported sit to stand Toilet Transfer: Simulated;+2 Total assistance Toilet Transfer: Patient Percentage: 80% Armed forces technical officer Method: Sit to stand Toileting - Water quality scientist and Hygiene: Simulated;Maximal assistance Where Assessed - [place] and Hygiene: Standing ADL Comments: Pt became dizzy upon standing. BP 160/90. No s/s of vestibular issues. Pt fatigues quickly with poor activity tolerance.    OT Diagnosis: Generalized weakness  OT Problem List: Decreased  strength;Decreased coordination;Decreased activity tolerance;Decreased safety awareness;Decreased knowledge of use of DME or AE;Impaired balance (sitting and/or standing);Obesity;Pain;Impaired vision/perception OT Treatment Interventions: Self-care/ADL training;Therapeutic activities;Visual/perceptual remediation/compensation;DME and/or AE instruction;Patient/family education;Balance training   OT Goals Acute Rehab OT Goals OT Goal Formulation: With patient Time For Goal Achievement: 04/11/12 Potential to Achieve Goals: Good ADL Goals Pt Will Perform Grooming: with supervision;Standing at sink;Other (comment) (x 3 tasks to improve standing activity tolerance.) ADL Goal: Grooming - Progress: Goal set today Pt Will Perform Upper Body Bathing: with set-up;Sitting, edge of bed;Sitting, chair;Unsupported ADL Goal: Upper Body Bathing - Progress: Goal set today Pt Will Perform Lower Body Bathing: with min assist;Sit to stand from chair;Sit to stand from bed ADL Goal: Lower Body Bathing - Progress: Goal set today Pt Will Perform Upper Body Dressing: with set-up;Sitting, bed;Sitting, chair;Unsupported ADL Goal: Upper Body Dressing - Progress: Goal set today Pt Will Perform Lower Body Dressing: with min assist;Sit to stand from bed;Sit to stand from chair ADL Goal: Lower Body Dressing - Progress: Goal set today Pt Will Transfer to Toilet: with supervision;Ambulation;Stand pivot transfer;Extra wide 3-in-1 ADL Goal: Toilet Transfer - Progress: Goal set today Pt Will Perform Toileting - Clothing Manipulation: with supervision;Standing ADL Goal: Toileting - Clothing Manipulation - Progress: Goal set today Pt Will Perform Toileting - Hygiene: with supervision;Sit to stand from 3-in-1/toilet ADL Goal: Toileting - Hygiene - Progress: Goal set today Miscellaneous OT Goals Miscellaneous OT Goal #1: Pt will perform visual compensatory strategies with min VCs for successful ADL completion. OT Goal:  Miscellaneous Goal #1 - Progress: Goal set today  Visit Information  Last OT Received On: 03/28/12 Assistance Needed: +2 PT/OT Co-Evaluation/Treatment: Yes    Subjective Data  Subjective: My hip hurts so bad Patient Stated Goal: I like to draw.   Prior Functioning  Vision/Perception  Home Living Lives With: Alone Type of Home: House Home Access: Stairs to enter Entrance Stairs-Number of Steps: 1 Home Layout: One level Bathroom Shower/Tub: Chiropodist: Handicapped height Home Adaptive Equipment: Shower chair with back;Grab bars in shower;Grab bars around toilet;Straight cane;Quad cane;Walker - standard Prior Function Level of Independence: Independent with assistive device(s) Driving: Yes Vocation: Retired Corporate investment banker: No difficulties Dominant Hand: Right   Vision - Assessment Eye Alignment: Within Functional Limits Vision Assessment: Vision tested Tracking/Visual Pursuits: Impaired - to be further tested in functional context;Other (comment) (Pt was unable to track horizontally.) Convergence: Impaired (comment) (Convergence absent.) Diplopia Assessment: Present in primary gaze;Present all the time/all directions Additional Comments: Pt  was unable to horizontally track but was able to vertically. Pt stated he felt as if his eyes were moving even though they were not.  Cognition  Overall Cognitive Status: Appears within functional limits for tasks assessed/performed Arousal/Alertness: Awake/alert Orientation Level: Appears intact for tasks assessed Behavior During Session: [facility] for tasks performed    Extremity/Trunk Assessment Right Upper Extremity Assessment RUE ROM/Strength/Tone: Deficits RUE ROM/Strength/Tone Deficits: AROM WFL strength 4-/5 weak grip. RUE Sensation: Deficits RUE Sensation Deficits: Pt lacks sensation in thumb and first two digits. RUE Coordination: Deficits RUE Coordination Deficits: Difficulty with opposition,  although pt stated he is able to sketch with his right hand. Pt states he does frequently drop utensils, pencils and ADL items. 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, R hip painful with movement, AAROM grossly WFL RLE Sensation: Deficits;History of peripheral neuropathy RLE Sensation Deficits: absent to light touch in foot Left Lower Extremity Assessment LLE ROM/Strength/Tone: WFL for tasks assessed LLE Sensation: Deficits;History of peripheral neuropathy LLE Sensation Deficits: absent to light touch in foot LLE Coordination: WFL - gross/fine motor Trunk Assessment Trunk Assessment: Normal   Mobility Bed Mobility Bed Mobility: Supine to Sit;Sit to Supine Supine to Sit: 1: +2 Total assist;With rails;HOB elevated Supine to Sit: Patient Percentage: 80% Sit to Supine: 1: +2 Total assist;With rail;HOB elevated Sit to Supine: Patient Percentage: 40% Details for Bed Mobility Assistance: cues for hand placement, sequencing and technique. Pt c/o L elbow pain following supine>sit. RN made aware. For sit>supine, pt required physical A for LEs and trunk. Transfers Transfers: Sit to Stand;Stand to Sit Sit to Stand: 1: +2 Total assist;With upper extremity assist;From bed Sit to Stand: Patient Percentage: 80% Stand to Sit: 1: +2 Total assist;To bed;With upper extremity assist Stand to Sit: Patient Percentage: 80% Details for Transfer Assistance: min cues for hand placement. Pt reports dizziness upon standing. BP assessed, no signicant drop compared to sitting.    Exercise    Balance Balance Balance Assessed: Yes Static Sitting Balance Static Sitting - Balance Support: No upper extremity supported;Feet  supported Static Sitting - Level of Assistance: 5: Stand by assistance Static Standing Balance Static Standing - Balance Support: Bilateral upper extremity supported;During functional activity Static Standing - Level of Assistance: 4: Min assist  End of Session OT - End of Session Equipment Utilized During Treatment: Gait belt Activity Tolerance: Patient limited by pain;Patient limited by fatigue Patient left: in bed;with call bell/phone within reach Nurse Communication: Mobility status  GO Functional Assessment Tool Used: Clinical Judgement Functional Limitation: Self care Self Care Current Status [id number]): At  least 60 percent but less than 80 percent impaired, limited or restricted Self Care Goal Status [id number]): At least 1 percent but less than 20 percent impaired, limited or restricted   [place] A OTR/L [phone] 03/28/2012, 11:23 AM

## 2012-03-28 NOTE — Progress Notes (Signed)
Inpatient Diabetes Program Recommendations  AACE/ADA: New Consensus Statement on Inpatient Glycemic Control (2009)  Target Ranges:  Prepandial:   less than 140 mg/dL      Peak postprandial:   less than 180 mg/dL (1-2 hours)      Critically ill patients:  140 - 180 mg/dL   Results for RYHEEM, FERRONI (MRN [id number]) as of 03/28/2012 13:33  Ref. Range 03/27/2012 19:26 03/28/2012 07:39 03/28/2012 11:48  Glucose-Capillary Latest Range: 70-99 mg/dL 310 (H) 273 (H) 240 (H)    Inpatient Diabetes Program Recommendations Insulin - Basal: Increase 70/30 (current dose 50 BID) HgbA1C: =9.5 Will need follow-up with PCP for med adjustments.    Thank you  Raoul Pitch [facility] Inpatient Diabetes Coordinator [phone]

## 2012-03-28 NOTE — Care Management Note (Unsigned)
    Page 1 of 1   03/28/2012     4:30:33 PM   CARE MANAGEMENT NOTE 03/28/2012  Patient:  [name], [name]   Account Number:  [id number]  Date Initiated:  03/28/2012  Documentation initiated by:  Dessa Phi  Subjective/Objective Assessment:   ADMITTED W/FALL.HX:HTN,DM,MORBID OBESITY.     Action/Plan:   FROM HOME ALONE.   Anticipated DC Date:  03/29/2012   Anticipated DC Plan:  HOME/SELF CARE  In-house referral  Clinical Social Worker      DC Planning Services  CM consult  Patient refused services      Choice offered to / List presented to:             Status of service:  In process, will continue to follow Medicare Important Message given?   (If response is "NO", the following Medicare IM given date fields will be blank) Date Medicare IM given:   Date Additional Medicare IM given:    Discharge Disposition:    Per UR Regulation:  Reviewed for med. necessity/level of care/duration of stay  If discussed at [place] of Stay Meetings, dates discussed:    Comments:  03/28/12 Kleber Crean RN,BSN NCM [phone] PER INPT REHAB MD-SNF VS REHAB.REHAB COORDIN-NOT LIKELY TO GET AUTH FOR INPATIENT REHAB.PATIENT DECLINE SNF OR HH.WILL STILL GIVE HHC AGENCY LIST AS RESOURCE.WILL NEED TRANSP @ D/C.MD UPDATED.  PT-INPT REHAB.AWAIT INPT REHAB EVAL.PATIENT STATES HE WILL NEED TRANSPORTATION @ D/C-SW NOTIFIED.

## 2012-03-29 LAB — GLUCOSE, CAPILLARY
Glucose-Capillary: 173 mg/dL — ABNORMAL HIGH (ref 70–99)
Glucose-Capillary: 96 mg/dL (ref 70–99)

## 2012-03-29 LAB — BASIC METABOLIC PANEL
CO2: 25 mEq/L (ref 19–32)
Calcium: 8.7 mg/dL (ref 8.4–10.5)
GFR calc non Af Amer: >90 mL/min (ref >90)
Glucose, Bld: 138 mg/dL — ABNORMAL HIGH (ref 70–99)
Potassium: 3.6 mEq/L (ref 3.5–5.1)
Sodium: 134 mEq/L — ABNORMAL LOW (ref 135–145)

## 2012-03-29 LAB — CBC
Hemoglobin: 14.4 g/dL (ref 13.0–17.0)
MCH: 29.8 pg (ref 26.0–34.0)
MCHC: 33.6 g/dL (ref 30.0–36.0)
Platelets: 195 10*3/uL (ref 150–400)
RBC: 4.84 MIL/uL (ref 4.22–5.81)

## 2012-03-29 MED ORDER — SENNOSIDES-DOCUSATE SODIUM 8.6-50 MG PO TABS
1.0000 | ORAL_TABLET | Freq: Every day | ORAL | Status: DC | PRN
  Filled 2012-03-29: qty 1

## 2012-03-29 MED ORDER — TRAMADOL HCL 50 MG PO TABS
50.0000 mg | ORAL_TABLET | Freq: Every day | ORAL | Status: DC | PRN
  Administered 2012-03-29 – 2012-03-30 (×2): 50 mg via ORAL
  Filled 2012-03-29 (×2): qty 1

## 2012-03-29 MED ORDER — DOCUSATE SODIUM 100 MG PO CAPS
100.0000 mg | ORAL_CAPSULE | Freq: Two times a day (BID) | ORAL | Status: DC | PRN
  Administered 2012-03-29: 100 mg via ORAL
  Filled 2012-03-29 (×2): qty 1

## 2012-03-29 NOTE — Progress Notes (Signed)
Occupational Therapy Treatment Patient Details Name: [name] MRN: [id number] DOB: 27-Jul-1946 Today's Date: 03/29/2012 Time: [id number] OT Time Calculation (min): 25 min  OT Assessment / Plan / Recommendation Comments on Treatment Session Pt tolerated treatment well. No reports of diplopia. Pt would benefit from [facility] but declines due to cost.     Follow Up Recommendations  If pt does not discharge to Inpatient Rehab or Skilled nursing facility, recommend [facility] but likely an issue due to cost. Pt concerned about finances.   Barriers to Discharge       Equipment Recommendations  None recommended by OT    Recommendations for Other Services    Frequency Min 2X/week   Plan Discharge plan needs to be updated    Precautions / Restrictions Precautions Precautions: Fall Restrictions Weight Bearing Restrictions: No        ADL  Lower Body Dressing: Performed;Set up;Other (comment) (don socks at EOB) Where Assessed - Lower Body Dressing: Unsupported sitting Toilet Transfer: Performed;Min guard;Other (comment) (with bariatric RW) Toilet Transfer Method: Other (comment) (BSC across the room) Toilet Transfer Equipment: Extra wide bedside commode Toileting - Clothing Manipulation and Hygiene: Simulated;Min guard Where Assessed - Toileting Clothing Manipulation and Hygiene: Sit to stand from 3-in-1 or toilet Equipment Used: Rolling walker ADL Comments: Pt reporting no double vision today. Tested visual fields and pt only seeing single image of OT's finger with testing. Pt states he is planning discharge home as he cant financially afford rehab or even [facility] therapy. Pt states he normally uses a cane when he goes out into the community but a RW to walk down his hallway at home. He holds to furniture inside of his rooms as he cant fit the RW through going forward. Discussed use of RW initially for increased safety as he reports that he wasnt using an AD when he fell. Pt states he can bring the  RW through the doorway if he goes sideways. Explained safety recommendations of standing up and making sure he isnt dizzy before he starts walking also. Pt with no dizziness today. Pt declines a BSC for home due to cost. He states he feels his higher commode and grab bar have been working ok for him. Discussed safety with no stepping into tub to his seat until he feels he can safely step over side of tub. Reommended sponge bathing initially and then holding to grab bar on tub and practicing picking up LEs and  make sure he can really raise his legs off the floor to simulate step over a tub before he attempts tub transfer. Pt verbalized understanding of all recommendations. He states he feels he can manage at home. Was min guard assist for functional mobility in the room with RW. Pt declined stepping out into hallway for increased distance.     OT Diagnosis:    OT Problem List:   OT Treatment Interventions:     OT Goals ADL Goals ADL Goal: Lower Body Dressing - Progress: Progressing toward goals ADL Goal: Toilet Transfer - Progress: Progressing toward goals ADL Goal: Toileting - Clothing Manipulation - Progress: Progressing toward goals  Visit Information  Last OT Received On: 03/29/12 Assistance Needed: +1    Subjective Data  Subjective: my double vision resolved Patient Stated Goal: to get his legs stronger   Prior Functioning       Cognition  Overall Cognitive Status: Appears within functional limits for tasks assessed/performed Arousal/Alertness: Awake/alert Orientation Level: Appears intact for tasks assessed Behavior During Session: [facility] for  tasks performed    Mobility Bed Mobility Bed Mobility: Supine to Sit Supine to Sit: 5: Supervision;With rails;HOB elevated Sit to Supine: 5: Supervision;HOB flat;With rail Details for Bed Mobility Assistance: no reports of discomfort or pain. no assist needed this visit. Transfers Transfers: Sit to Stand;Stand to Sit Sit to Stand: 4: Min  guard;With upper extremity assist;From bed;From elevated surface;From chair/3-in-1 Stand to Sit: 4: Min guard;With upper extremity assist;To bed;To chair/3-in-1 Details for Transfer Assistance: min cues for hand placement and to back up to the 3in1 completely before letting go of RW.    Exercises    Balance    End of Session OT - End of Session Activity Tolerance: Patient tolerated treatment well Patient left: in bed;with call bell/phone within reach  GO     Jules Schick [id number] 03/29/2012, 1:41 PM

## 2012-03-29 NOTE — Progress Notes (Signed)
Patient ID: [name], male   DOB: 18-Nov-1945, 66 y.o.   MRN: [id number] Subjective: Pt feel some better, Still not OOB Cannot afford Rehab- want to go home PT/OT- rec HHN/PT BS better  Objective: Vital signs in last 24 hours: Temp:  [97.5 F (36.4 C)-98 F (36.7 C)] 97.5 F (36.4 C) (07/20 1400) Pulse Rate:  [69-76] 70  (07/20 1400) Resp:  [18] 18  (07/20 1400) BP: (127-156)/(75-83) 156/75 mmHg (07/20 1400) SpO2:  [96 %] 96 % (07/20 1400) Weight:  [172.54 kg (380 lb 6.1 oz)] 172.54 kg (380 lb 6.1 oz) (07/20 0538)   Intake/Output from previous day: 07/19 0701 - 07/20 0700 In: 480 [P.O.:480] Out: 3875 [Urine:3875]    General appearance: alert Lung: clear Chest wall: no tenderness Cardio: regular rate and rhythm Extremities: venous stasis dermatitis noted  Lab Results:  Basename 03/29/12 0600 03/27/12 1941  WBC 8.0 9.0  HGB 14.4 14.9  HCT 42.9 43.0  PLT 195 211   BMET  Basename 03/29/12 0600 03/27/12 1941  NA 134* 132*  K 3.6 3.7  CL 101 96  CO2 25 24  GLUCOSE 138* 337*  BUN 7 12  CREATININE 0.59 0.57  CALCIUM 8.7 8.6   Lab Results  Component Value Date   ALT 19 03/11/2012   AST 17 03/11/2012   ALKPHOS 101 03/11/2012   BILITOT 0.7 03/11/2012    Assessment/Plan:  Active Problems:  UTI (lower urinary tract infection) cipro PO DM with elevated Glucose- poor control DM at home- BS better Chronic Foley-  H/o CVA/ weakness- Rehab eval done- [employer] may not cover- PT cannot afford; HHN/PT/OT Dehydration improved with IVF - Cr normal DJD, chronic pain- ultram prn OOB- ambulate with therapy Possible d/c in am.    [name] 03/29/2012, 3:17 PM

## 2012-03-30 DIAGNOSIS — R269 Unspecified abnormalities of gait and mobility: Secondary | ICD-10-CM | POA: Diagnosis present

## 2012-03-30 LAB — URINE CULTURE: Colony Count: >=100000

## 2012-03-30 LAB — GLUCOSE, CAPILLARY: Glucose-Capillary: 154 mg/dL — ABNORMAL HIGH (ref 70–99)

## 2012-03-30 MED ORDER — TRAMADOL HCL 50 MG PO TABS: 50.0000 mg | ORAL_TABLET | Freq: Every day | ORAL | Status: AC | PRN

## 2012-03-30 MED ORDER — CIPROFLOXACIN HCL 500 MG PO TABS: 500.0000 mg | ORAL_TABLET | Freq: Two times a day (BID) | ORAL | Status: AC

## 2012-03-30 NOTE — Progress Notes (Signed)
Cm spoke with patient concerning dc planning. Pt offered choice for [facility]. Per pt choice [facility] to provide [facility] services upon discharge. [facility] notified of referral. MD orders,H/P, demographics faxed to [facility] at [phone]. Patient states needing a taxi voucher for a Lucianne Lei to tx home due to his size. Cm contacted [name] concerning need. No other Hh needs specified at this time. Patient states having no family but friends are available to assist with home care. Paitent uses [facility] on [address].   Arlean Hopping [phone]

## 2012-03-30 NOTE — Plan of Care (Signed)
Problem: Discharge Progression Outcomes Goal: Obtain signed CBG meter Rx form Outcome: Completed/Met Date Met:  03/30/12 Patient already have one at home. Goal: Activity appropriate for discharge plan Outcome: Adequate for Discharge F/U with home health PT/OT.

## 2012-03-30 NOTE — Discharge Summary (Signed)
Physician Discharge Summary  Patient ID: [name] MRN: [id number] DOB/AGE: Jun 15, 1946 66 y.o.  Admit date: 03/27/2012 Discharge date: 03/30/2012  Admission Diagnoses:  Discharge Diagnoses:  Active Problems:  UTI (lower urinary tract infection)  Gait abnormality Weakness generalized DM with neuropathy Hyperglycemia Dehydration Chronic Foley SOB Obesity morbid OSA H/o CVA Venous insuff chronic pain      Discharged Condition: good  Hospital Course: 31 male with multiple medical issue, admit with episode of weakness,  Dizzy, elevated glucose, UTI #1:  UTI: started on cipro po, culture negative, chronic foley catheter, continue on cipro for 10 days Foley changed recently. WBC normal. No fever #2: weakness dizzy, gait abnormality, prev h/o CVA,  IP Rehab - recommend rehab, but due to insurance and cost patient decline rehab. PT OT rec HHN/PT will arrange this. Worked with Therapy in the hospital with walking. Neuropathy and chronic other condition effect his balance. #3: DM with elevated Glucose- poor compliance  At home BS improved, continue same meds. With strict diet. #4: h/o OSA- continue CPAP #5:  dehydration and low sodium resolved  with IVF. #6: BP remain stable continue same meds. #7:  chornic pain- ultram x5 a day prn.  Consults: rehabilitation medicine  Significant Diagnostic Studies: labs: all labs ok. and radiology: CXR: normal  Treatments: IV hydration and antibiotics: Cipro  Discharge Exam: Blood pressure 158/86, pulse 77, temperature 97.8 F (36.6 C), temperature source Oral, resp. rate 20, height 6' (1.829 m), weight 168.9 kg (372 lb 5.7 oz), SpO2 97.00%. General appearance: alert Resp: clear to auscultation bilaterally Cardio: regular rate and rhythm GI: soft, non-tender; bowel sounds normal; no masses,  no organomegaly Extremities: varicose veins noted and venous stasis dermatitis noted  Disposition: 01-Home or Self Care  Discharge Orders    Future Orders Please Complete By Expires   Diet - low sodium heart healthy      Increase activity slowly        Medication List  As of 03/30/2012 11:39 AM   TAKE these medications         aspirin EC 81 MG tablet   Take 81 mg by mouth daily.      bisacodyl 5 MG EC tablet   Commonly known as: DULCOLAX   Take 10 mg by mouth once.      ciprofloxacin 500 MG tablet   Commonly known as: CIPRO   Take 1 tablet (500 mg total) by mouth 2 (two) times daily.      clopidogrel 75 MG tablet   Commonly known as: PLAVIX   Take 1 tablet (75 mg total) by mouth daily with breakfast.      diphenhydrAMINE 25 mg capsule   Commonly known as: BENADRYL   Take 25 mg by mouth every 6 (six) hours as needed. For allergy.      diphenoxylate-atropine 2.5-0.025 MG per tablet   Commonly known as: LOMOTIL   Take 1 tablet by mouth 4 (four) times daily as needed. Diarrhea      furosemide 40 MG tablet   Commonly known as: LASIX   Take 40 mg by mouth daily.      insulin NPH-insulin regular (70-30) 100 UNIT/ML injection   Commonly known as: NOVOLIN 70/30   Inject 55 Units into the skin 2 (two) times daily with a meal.      isosorbide mononitrate 30 MG 24 hr tablet   Commonly known as: IMDUR   Take 1 tablet (30 mg total) by mouth daily.  metoprolol 50 MG tablet   Commonly known as: LOPRESSOR   Take 1 tablet (50 mg total) by mouth 2 (two) times daily.      nitroGLYCERIN 0.4 MG SL tablet   Commonly known as: NITROSTAT   Place 1 tablet (0.4 mg total) under the tongue every 5 (five) minutes x 3 doses as needed for chest pain.      omeprazole 20 MG capsule   Commonly known as: PRILOSEC   Take 20 mg by mouth daily.      oxybutynin 5 MG tablet   Commonly known as: DITROPAN   Take 5 mg by mouth 2 (two) times daily as needed. For bladder pain.      ramipril 5 MG capsule   Commonly known as: ALTACE   Take 1 capsule (5 mg total) by mouth daily.      traMADol 50 MG tablet   Commonly known as: ULTRAM    Take 1 tablet (50 mg total) by mouth 5 (five) times daily as needed for pain.           Follow-up Information    Follow up with Irven Shelling, MD.   Contact information:   [address], [address] [place], [place]. [place] [phone]        discharge time total 35 mins  Signed: Wenda Low 03/30/2012, 11:39 AM

## 2012-03-30 NOTE — Progress Notes (Signed)
[name] received referral to assist with transportation.  Pt provided with cab voucher. Pt had no other available transportation however Pt is aware that these are limited services and he can not rely on this mode of transportation. Pt has been instructed to line up appropriate transportation for any further admissions.   No further [name] needs identified.  Pete Pelt, LCSWA [employer] [phone]

## 2012-07-26 ENCOUNTER — Emergency Department (HOSPITAL_COMMUNITY)
Admission: EM | Admit: 2012-07-26 | Discharge: 2012-07-26 | Disposition: A | Discharge status: Home / Self Care | Payer: Medicare Other | Attending: Emergency Medicine | Admitting: Emergency Medicine

## 2012-07-26 ENCOUNTER — Encounter (HOSPITAL_COMMUNITY): Payer: Self-pay | Admitting: Emergency Medicine

## 2012-07-26 DIAGNOSIS — I6350 Cerebral infarction due to unspecified occlusion or stenosis of unspecified cerebral artery: Secondary | ICD-10-CM | POA: Insufficient documentation

## 2012-07-26 DIAGNOSIS — Z87448 Personal history of other diseases of urinary system: Secondary | ICD-10-CM | POA: Insufficient documentation

## 2012-07-26 DIAGNOSIS — E785 Hyperlipidemia, unspecified: Secondary | ICD-10-CM | POA: Insufficient documentation

## 2012-07-26 DIAGNOSIS — N390 Urinary tract infection, site not specified: Secondary | ICD-10-CM | POA: Insufficient documentation

## 2012-07-26 DIAGNOSIS — E1149 Type 2 diabetes mellitus with other diabetic neurological complication: Secondary | ICD-10-CM | POA: Insufficient documentation

## 2012-07-26 DIAGNOSIS — K219 Gastro-esophageal reflux disease without esophagitis: Secondary | ICD-10-CM | POA: Insufficient documentation

## 2012-07-26 DIAGNOSIS — Z79899 Other long term (current) drug therapy: Secondary | ICD-10-CM | POA: Insufficient documentation

## 2012-07-26 DIAGNOSIS — E1142 Type 2 diabetes mellitus with diabetic polyneuropathy: Secondary | ICD-10-CM | POA: Insufficient documentation

## 2012-07-26 DIAGNOSIS — G4730 Sleep apnea, unspecified: Secondary | ICD-10-CM | POA: Insufficient documentation

## 2012-07-26 DIAGNOSIS — R197 Diarrhea, unspecified: Secondary | ICD-10-CM | POA: Insufficient documentation

## 2012-07-26 DIAGNOSIS — E1169 Type 2 diabetes mellitus with other specified complication: Secondary | ICD-10-CM | POA: Insufficient documentation

## 2012-07-26 DIAGNOSIS — Y846 Urinary catheterization as the cause of abnormal reaction of the patient, or of later complication, without mention of misadventure at the time of the procedure: Secondary | ICD-10-CM | POA: Insufficient documentation

## 2012-07-26 DIAGNOSIS — T839XXA Unspecified complication of genitourinary prosthetic device, implant and graft, initial encounter: Secondary | ICD-10-CM

## 2012-07-26 DIAGNOSIS — E669 Obesity, unspecified: Secondary | ICD-10-CM | POA: Insufficient documentation

## 2012-07-26 DIAGNOSIS — T83091A Other mechanical complication of indwelling urethral catheter, initial encounter: Secondary | ICD-10-CM | POA: Insufficient documentation

## 2012-07-26 DIAGNOSIS — Z7902 Long term (current) use of antithrombotics/antiplatelets: Secondary | ICD-10-CM | POA: Insufficient documentation

## 2012-07-26 DIAGNOSIS — I10 Essential (primary) hypertension: Secondary | ICD-10-CM | POA: Insufficient documentation

## 2012-07-26 HISTORY — DX: Neuromuscular dysfunction of bladder, unspecified: N31.9

## 2012-07-26 HISTORY — DX: Cerebral infarction, unspecified: I63.9

## 2012-07-26 LAB — URINALYSIS, MICROSCOPIC ONLY
Bilirubin Urine: NEGATIVE
Nitrite: NEGATIVE
Protein, ur: >300 mg/dL — AB
Urobilinogen, UA: 1 mg/dL (ref 0.0–1.0)
pH: 7.5 (ref 5.0–8.0)

## 2012-07-26 MED ORDER — SULFAMETHOXAZOLE-TRIMETHOPRIM 800-160 MG PO TABS: 1.0000 | ORAL_TABLET | Freq: Two times a day (BID) | ORAL | Status: DC

## 2012-07-26 MED ORDER — SULFAMETHOXAZOLE-TMP DS 800-160 MG PO TABS
1.0000 | ORAL_TABLET | Freq: Once | ORAL | Status: AC
  Administered 2012-07-26: 1 via ORAL
  Filled 2012-07-26: qty 1

## 2012-07-26 NOTE — ED Provider Notes (Addendum)
History     CSN: [id number]  Arrival date & time 07/26/12  0828   First MD Initiated Contact with Patient 07/26/12 [phone]      Chief Complaint  Patient presents with  . catheter not draining     (Consider location/radiation/quality/duration/timing/severity/associated sxs/prior treatment) HPI  PT relates he has a history of having a foley for a couple of years, he was scheduled to have it replaced in 3 days. States during the night he started seeing a lot of sediment in the catheter and it wasn't draining well. Denies fever, chills,nausea, vomiting, but has abdominal discomfort that is mild.   PCP Dr Electa Sniff Urologist Dr Alinda Money  Past Medical History  Diagnosis Date  . Obese   . Hypertension   . Diabetes mellitus   . Diabetic diarrhea   . Sleep apnea   . Diabetic neuropathy, painful   . Chronic indwelling foley catheter   . Bladder spasms   . Complication of anesthesia   . PONV (postoperative nausea and vomiting)   . Hyperlipidemia   . GERD (gastroesophageal reflux disease)   . Neurogenic bladder   . Stroke 2013    Past Surgical History  Procedure Date  . Right tkr   . Gastroplasty   . Cholecystectomy   . Total knee arthroplasty     Family History  Problem Relation Age of Onset  . Hypertension Mother   . Diabetes type I Father   . Cancer Brother      Testicular Cancer  . Cancer Brother     Leukemia    History  Substance Use Topics  . Smoking status: Former Smoker -- 2.0 packs/day for 20 years    Quit date: 09/10/1976  . Smokeless tobacco: Never Used  . Alcohol Use: No     Comment: FORMER ALCOHOLIC WITH 23 YEAR SOBRIETY  Lives at home Lives alone    Review of Systems  All other systems reviewed and are negative.    Allergies  Ace inhibitors; Cefadroxil; Cephalexin; Lipitor; Metformin and related; Methocarbamol; and Morphine and related  Home Medications   Current Outpatient Rx  Name  Route  Sig  Dispense  Refill  . ASPIRIN EC 81 MG PO  TBEC   Oral   Take 81 mg by mouth daily.         Marland Kitchen BISACODYL 5 MG PO TBEC   Oral   Take 10 mg by mouth once.         . CLOPIDOGREL BISULFATE 75 MG PO TABS   Oral   Take 1 tablet (75 mg total) by mouth daily with breakfast.   30 tablet   11   . DIPHENHYDRAMINE HCL 25 MG PO CAPS   Oral   Take 25 mg by mouth every 6 (six) hours as needed. For allergy.         Marland Kitchen DIPHENOXYLATE-ATROPINE 2.5-0.025 MG PO TABS   Oral   Take 1 tablet by mouth 4 (four) times daily as needed. Diarrhea         . FUROSEMIDE 40 MG PO TABS   Oral   Take 40 mg by mouth daily.         . INSULIN ISOPHANE & REGULAR (70-30) 100 UNIT/ML [place] SUSP   Subcutaneous   Inject 55 Units into the skin 2 (two) times daily with a meal.         . ISOSORBIDE MONONITRATE ER 30 MG PO TB24   Oral   Take 1 tablet (30 mg  total) by mouth daily.   30 tablet   11   . METOPROLOL TARTRATE 50 MG PO TABS   Oral   Take 1 tablet (50 mg total) by mouth 2 (two) times daily.   60 tablet   11   . NITROGLYCERIN 0.4 MG SL SUBL   Sublingual   Place 1 tablet (0.4 mg total) under the tongue every 5 (five) minutes x 3 doses as needed for chest pain.   1 tablet   1   . OMEPRAZOLE 20 MG PO CPDR   Oral   Take 20 mg by mouth daily.         . OXYBUTYNIN CHLORIDE 5 MG PO TABS   Oral   Take 5 mg by mouth 2 (two) times daily as needed. For bladder pain.         Marland Kitchen RAMIPRIL 5 MG PO CAPS   Oral   Take 1 capsule (5 mg total) by mouth daily.   30 capsule   11     BP 167/95  Pulse 110  Temp 98.2 F (36.8 C) (Oral)  Resp 20  SpO2 100%  Vital signs normal except tachycardia   Physical Exam  Nursing note and vitals reviewed. Constitutional: He is oriented to person, place, and time. He appears well-developed and well-nourished.  Non-toxic appearance. He does not appear ill. No distress.       obese  HENT:  Head: Normocephalic and atraumatic.  Right Ear: External ear normal.  Left Ear: External ear normal.  Nose:  Nose normal. No mucosal edema or rhinorrhea.  Mouth/Throat: Mucous membranes are normal. No dental abscesses or uvula swelling.  Eyes: Conjunctivae normal and EOM are normal. Pupils are equal, round, and reactive to light.  Neck: Normal range of motion and full passive range of motion without pain. Neck supple.  Pulmonary/Chest: No respiratory distress. He has no rhonchi. He exhibits no crepitus.  Abdominal: Soft. Normal appearance. He exhibits no distension. There is tenderness.       Mild suprapubic discomfort  Musculoskeletal: Normal range of motion. He exhibits no edema and no tenderness.       Moves all extremities well.   Neurological: He is alert and oriented to person, place, and time. He has normal strength. No cranial nerve deficit.  Skin: Skin is warm, dry and intact. No rash noted. No erythema. No pallor.  Psychiatric: He has a normal mood and affect. His speech is normal and behavior is normal. His mood appears not anxious.    ED Course  Procedures (including critical care time)  Bladder scan was unable to be performed because of the abdominal wall thickness.  Nursing staff removed his old Foley catheter and replaced his 18-gauge catheter. He's noted to have a lot of sediment in his urine. Had 400 cc of urine output.   I reviewed patient's prior records he had a urine culture done July 18 that grew out Serratia Marcescens sensitive to Septra, I will start patient on Septra pending his urine culture results.  Results for orders placed during the hospital encounter of 07/26/12  URINALYSIS, MICROSCOPIC ONLY      Component Value Range   Color, Urine YELLOW  YELLOW   APPearance TURBID (*) CLEAR   Specific Gravity, Urine 1.031 (*) 1.005 - 1.030   pH 7.5  5.0 - 8.0   Glucose, UA >1000 (*) NEGATIVE mg/dL   Hgb urine dipstick MODERATE (*) NEGATIVE   Bilirubin Urine NEGATIVE  NEGATIVE   Ketones, ur  TRACE (*) NEGATIVE mg/dL   Protein, ur >300 (*) NEGATIVE mg/dL   Urobilinogen, UA  1.0  0.0 - 1.0 mg/dL   Nitrite NEGATIVE  NEGATIVE   Leukocytes, UA MODERATE (*) NEGATIVE   WBC, UA TOO NUMEROUS TO COUNT  <3 WBC/hpf   RBC / HPF 3-6  <3 RBC/hpf   Bacteria, UA RARE  RARE   Laboratory interpretation all normal except UTI    1. Foley catheter problem   2. UTI (lower urinary tract infection)     New Prescriptions   SULFAMETHOXAZOLE-TRIMETHOPRIM (SEPTRA DS) 800-160 MG PER TABLET    Take 1 tablet by mouth every 12 (twelve) hours.    Plan discharge  Rolland Porter, MD, [place], MD 07/26/12 Moran Daryon Remmert, MD 07/26/12 1042

## 2012-07-26 NOTE — ED Notes (Signed)
To ED via private vehicle with c/o urinary catheter not draining correctly. States since about midnight not draining right. Bladder feels full, with sediment in urine. Has had catheter x 2 years

## 2012-07-26 NOTE — ED Notes (Signed)
Materials management Larkin Ina) is locating 81F catheter per patient's request

## 2012-07-27 LAB — URINE CULTURE: Colony Count: >=100000

## 2012-10-23 ENCOUNTER — Inpatient Hospital Stay (HOSPITAL_COMMUNITY)
Admission: EM | Admit: 2012-10-23 | Discharge: 2012-10-26 | DRG: 065 | Disposition: A | Discharge status: Home / Self Care | Payer: Medicare Other | Attending: Internal Medicine | Admitting: Internal Medicine

## 2012-10-23 ENCOUNTER — Emergency Department (HOSPITAL_COMMUNITY): Payer: Medicare Other

## 2012-10-23 ENCOUNTER — Encounter (HOSPITAL_COMMUNITY): Payer: Self-pay | Admitting: *Deleted

## 2012-10-23 DIAGNOSIS — E785 Hyperlipidemia, unspecified: Secondary | ICD-10-CM | POA: Diagnosis present

## 2012-10-23 DIAGNOSIS — G4733 Obstructive sleep apnea (adult) (pediatric): Secondary | ICD-10-CM | POA: Diagnosis present

## 2012-10-23 DIAGNOSIS — Z79899 Other long term (current) drug therapy: Secondary | ICD-10-CM

## 2012-10-23 DIAGNOSIS — R519 Headache, unspecified: Secondary | ICD-10-CM

## 2012-10-23 DIAGNOSIS — Z794 Long term (current) use of insulin: Secondary | ICD-10-CM | POA: Diagnosis present

## 2012-10-23 DIAGNOSIS — E1140 Type 2 diabetes mellitus with diabetic neuropathy, unspecified: Secondary | ICD-10-CM

## 2012-10-23 DIAGNOSIS — K219 Gastro-esophageal reflux disease without esophagitis: Secondary | ICD-10-CM | POA: Diagnosis present

## 2012-10-23 DIAGNOSIS — H5460 Unqualified visual loss, one eye, unspecified: Secondary | ICD-10-CM | POA: Diagnosis present

## 2012-10-23 DIAGNOSIS — R269 Unspecified abnormalities of gait and mobility: Secondary | ICD-10-CM

## 2012-10-23 DIAGNOSIS — R197 Diarrhea, unspecified: Secondary | ICD-10-CM | POA: Diagnosis present

## 2012-10-23 DIAGNOSIS — R202 Paresthesia of skin: Secondary | ICD-10-CM

## 2012-10-23 DIAGNOSIS — R42 Dizziness and giddiness: Secondary | ICD-10-CM | POA: Diagnosis present

## 2012-10-23 DIAGNOSIS — E669 Obesity, unspecified: Secondary | ICD-10-CM | POA: Diagnosis present

## 2012-10-23 DIAGNOSIS — H539 Unspecified visual disturbance: Secondary | ICD-10-CM

## 2012-10-23 DIAGNOSIS — E1149 Type 2 diabetes mellitus with other diabetic neurological complication: Secondary | ICD-10-CM | POA: Diagnosis present

## 2012-10-23 DIAGNOSIS — E118 Type 2 diabetes mellitus with unspecified complications: Secondary | ICD-10-CM

## 2012-10-23 DIAGNOSIS — G459 Transient cerebral ischemic attack, unspecified: Secondary | ICD-10-CM | POA: Diagnosis present

## 2012-10-23 DIAGNOSIS — N319 Neuromuscular dysfunction of bladder, unspecified: Secondary | ICD-10-CM | POA: Diagnosis present

## 2012-10-23 DIAGNOSIS — Z8673 Personal history of transient ischemic attack (TIA), and cerebral infarction without residual deficits: Secondary | ICD-10-CM

## 2012-10-23 DIAGNOSIS — Z6841 Body Mass Index (BMI) 40.0 and over, adult: Secondary | ICD-10-CM

## 2012-10-23 DIAGNOSIS — I10 Essential (primary) hypertension: Secondary | ICD-10-CM | POA: Diagnosis present

## 2012-10-23 DIAGNOSIS — E0800 Diabetes mellitus due to underlying condition with hyperosmolarity without nonketotic hyperglycemic-hyperosmolar coma (NKHHC): Secondary | ICD-10-CM | POA: Diagnosis present

## 2012-10-23 DIAGNOSIS — I6350 Cerebral infarction due to unspecified occlusion or stenosis of unspecified cerebral artery: Principal | ICD-10-CM | POA: Diagnosis present

## 2012-10-23 DIAGNOSIS — R55 Syncope and collapse: Secondary | ICD-10-CM

## 2012-10-23 DIAGNOSIS — I639 Cerebral infarction, unspecified: Secondary | ICD-10-CM

## 2012-10-23 DIAGNOSIS — E1142 Type 2 diabetes mellitus with diabetic polyneuropathy: Secondary | ICD-10-CM | POA: Diagnosis present

## 2012-10-23 LAB — CBC WITH DIFFERENTIAL/PLATELET
Basophils Absolute: 0.1 10*3/uL (ref 0.0–0.1)
Eosinophils Absolute: 0.2 10*3/uL (ref 0.0–0.7)
Eosinophils Relative: 2 % (ref 0–5)
Lymphocytes Relative: 13 % (ref 12–46)
Lymphs Abs: 1.3 10*3/uL (ref 0.7–4.0)
MCV: 87.4 fL (ref 78.0–100.0)
Neutrophils Relative %: 79 % — ABNORMAL HIGH (ref 43–77)
Platelets: 195 10*3/uL (ref 150–400)
RBC: 4.86 MIL/uL (ref 4.22–5.81)
RDW: 13.7 % (ref 11.5–15.5)
WBC: 10.2 10*3/uL (ref 4.0–10.5)

## 2012-10-23 LAB — COMPREHENSIVE METABOLIC PANEL
ALT: 15 U/L (ref 0–53)
AST: 15 U/L (ref 0–37)
Alkaline Phosphatase: 76 U/L (ref 39–117)
CO2: 24 mEq/L (ref 19–32)
Calcium: 8.7 mg/dL (ref 8.4–10.5)
Glucose, Bld: 250 mg/dL — ABNORMAL HIGH (ref 70–99)
Potassium: 4.1 mEq/L (ref 3.5–5.1)
Sodium: 131 mEq/L — ABNORMAL LOW (ref 135–145)
Total Protein: 6.7 g/dL (ref 6.0–8.3)

## 2012-10-23 LAB — TROPONIN I: Troponin I: <0.3 ng/mL (ref <0.30)

## 2012-10-23 MED ORDER — ONDANSETRON HCL 4 MG/2ML IJ SOLN
4.0000 mg | Freq: Once | INTRAMUSCULAR | Status: AC
  Administered 2012-10-23: 4 mg via INTRAVENOUS
  Filled 2012-10-23: qty 2

## 2012-10-23 MED ORDER — SODIUM CHLORIDE 0.9 % IV BOLUS (SEPSIS)
500.0000 mL | Freq: Once | INTRAVENOUS | Status: AC
  Administered 2012-10-23: 500 mL via INTRAVENOUS

## 2012-10-23 MED ORDER — SODIUM CHLORIDE 0.9 % IV BOLUS (SEPSIS): 1000.0000 mL | Freq: Once | INTRAVENOUS | Status: DC

## 2012-10-23 MED ORDER — MECLIZINE HCL 25 MG PO TABS
25.0000 mg | ORAL_TABLET | Freq: Once | ORAL | Status: AC
  Administered 2012-10-23: 25 mg via ORAL
  Filled 2012-10-23: qty 1

## 2012-10-23 NOTE — ED Provider Notes (Signed)
History     CSN: [id number]  Arrival date & time 10/23/12  1941   First MD Initiated Contact with Patient 10/23/12 2011      Chief Complaint  Patient presents with  . Dizziness    (Consider location/radiation/quality/duration/timing/severity/associated sxs/prior treatment) HPI  Patient presents to the ED from home  With complaints of acute onset of dizziness and nausea without vomiting while shaving this afternoon around 4:00- 4:30 he bent over to clean his razor and when he stood up the dizziness started with the nausea which is coming in waves. He describes having bilateral mildly blurry vision. 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 He is also having SOB and bilateral lower leg heaviness which is also not different from his baseline. He does not have any fevers or injuries. He has not been sick recently. His PCP is Dr.Griffin, He has a PMH of hypertension, DM, stroke, neurogenic bladder.     Past Medical History  Diagnosis Date  . Obese   . Hypertension   . Diabetes mellitus   . Diabetic diarrhea   . Sleep apnea   . Diabetic neuropathy, painful   . Chronic indwelling foley catheter   . Bladder spasms   . Complication of anesthesia   . PONV (postoperative nausea and vomiting)   . Hyperlipidemia   . GERD (gastroesophageal reflux disease)   . Neurogenic bladder   . Stroke 2013    Past Surgical History  Procedure Laterality Date  . Right tkr    . Gastroplasty    . Cholecystectomy    . Total knee arthroplasty      Family History  Problem Relation Age of Onset  . Hypertension Mother   . Diabetes type I Father   . Cancer Brother      Testicular Cancer  . Cancer Brother     Leukemia    History  Substance Use Topics  . Smoking status: Former Smoker -- 2.00 packs/day for 20 years    Quit date: 09/10/1976  . Smokeless tobacco: Never Used  . Alcohol Use: No     Comment: FORMER ALCOHOLIC WITH 23  YEAR SOBRIETY      Review of Systems  Review of Systems  Gen: no weight loss, fevers, chills, night sweats  Eyes: no discharge or drainage, no occular pain or visual changes  Nose: no epistaxis or rhinorrhea  Mouth: no dental pain, no sore throat  Neck: no neck pain  Lungs:No wheezing, coughing or hemoptysis CV: no chest pain, palpitations, dependent edema or orthopnea  Abd: no abdominal pain, , vomiting + nausea GU: no dysuria or gross hematuria  MSK: right sided weakness, bilateral low leg heaviness at baseline Neuro: no headache, no focal neurologic deficits, + dizziness Skin: no abnormalities Psyche: negative.   Allergies  Ace inhibitors; Cefadroxil; Cephalexin; Lipitor; Metformin and related; Methocarbamol; and Morphine and related  Home Medications   Current Outpatient Rx  Name  Route  Sig  Dispense  Refill  . aspirin EC 81 MG tablet   Oral   Take 81 mg by mouth every morning.          . clopidogrel (PLAVIX) 75 MG tablet   Oral   Take 1 tablet (75 mg total) by mouth daily with breakfast.   30 tablet   11   . diphenhydrAMINE (BENADRYL) 25 mg capsule   Oral   Take 25 mg by mouth every 6 (  six) hours as needed. For allergy.         . diphenoxylate-atropine (LOMOTIL) 2.5-0.025 MG per tablet   Oral   Take 1 tablet by mouth 4 (four) times daily as needed. Diarrhea         . furosemide (LASIX) 40 MG tablet   Oral   Take 40 mg by mouth daily.         . insulin NPH-insulin regular (NOVOLIN 70/30) (70-30) 100 UNIT/ML injection   Subcutaneous   Inject 55 Units into the skin 2 (two) times daily with a meal.         . isosorbide mononitrate (IMDUR) 30 MG 24 hr tablet   Oral   Take 1 tablet (30 mg total) by mouth daily.   30 tablet   11   . metoprolol tartrate (LOPRESSOR) 50 MG tablet   Oral   Take 1 tablet (50 mg total) by mouth 2 (two) times daily.   60 tablet   11   . omeprazole (PRILOSEC) 20 MG capsule   Oral   Take 20 mg by mouth daily.          Marland Kitchen oxybutynin (DITROPAN) 5 MG tablet   Oral   Take 5 mg by mouth 2 (two) times daily as needed. For bladder pain.         . traMADol (ULTRAM) 50 MG tablet   Oral   Take 50 mg by mouth every 6 (six) hours as needed for pain.         . nitroGLYCERIN (NITROSTAT) 0.4 MG SL tablet   Sublingual   Place 1 tablet (0.4 mg total) under the tongue every 5 (five) minutes x 3 doses as needed for chest pain.   1 tablet   1     BP 129/105  Pulse 74  Temp(Src) 97.8 F (36.6 C) (Oral)  Resp 18  Wt 387 lb (175.542 kg)  BMI 52.48 kg/m2  SpO2 100%  Physical Exam  Nursing note and vitals reviewed. Constitutional: He appears well-developed and well-nourished. No distress.  HENT:  Head: Normocephalic and atraumatic.  Eyes: Pupils are equal, round, and reactive to light.  Neck: Normal range of motion. Neck supple.  Cardiovascular: Normal rate and regular rhythm.   Pulmonary/Chest: Effort normal.  Abdominal: Soft.  Neurological: He is alert. No cranial nerve deficit or sensory deficit.  Pt has baseline gait, strength and finger to nose abnormalities from previous stroke. Pt A &O x 3. No left sided deficits.   Skin: Skin is warm and dry.    ED Course  Procedures (including critical care time)  Labs Reviewed  CBC WITH DIFFERENTIAL - Abnormal; Notable for the following:    Neutrophils Relative 79 (*)    Neutro Abs 8.0 (*)    All other components within normal limits  COMPREHENSIVE METABOLIC PANEL - Abnormal; Notable for the following:    Sodium 131 (*)    Chloride 95 (*)    Glucose, Bld 250 (*)    Albumin 3.3 (*)    All other components within normal limits  PROTIME-INR  TROPONIN I   Dg Chest 2 View  10/23/2012  *RADIOLOGY REPORT*  Clinical Data: Shortness of breath and syncope.  CHEST - 2 VIEW  Comparison: Multiple exams, including 03/10/2012  Findings: Cardiothoracic index 57%, considered borderline enlarged based on the apical lordotic projection and AP semi upright  technique. Lower thoracic spondylosis is observed.  The costophrenic angles are clipped posteriorly.  Borderline appearance for pulmonary venous hypertension.  IMPRESSION:  1.  Borderline cardiomegaly and borderline appearance for pulmonary venous hypertension. 2.  Lower thoracic spondylosis.   Original Report Authenticated By: Van Clines, M.D.    Ct Head Wo Contrast  10/23/2012  *RADIOLOGY REPORT*  Clinical Data: Dizziness and nausea.  CT HEAD WITHOUT CONTRAST  Technique:  Contiguous axial images were obtained from the base of the skull through the vertex without contrast.  Comparison: CT of the head performed 03/27/2012  Findings: There is no evidence of acute infarction, mass lesion, or intra- or extra-axial hemorrhage on CT.  Mild chronic lacunar infarcts are noted within the cerebellar hemispheres bilaterally.  Areas of increased attenuation within the basal ganglia and thalami are compatible with calcification, stable from the prior study.  The brainstem and fourth ventricle are within normal limits.  The third and lateral ventricles are unremarkable in appearance.  The cerebral hemispheres are symmetric in appearance, with normal gray- white differentiation.  No mass effect or midline shift is seen.  There is no evidence of fracture; visualized osseous structures are unremarkable in appearance.  The orbits are within normal limits. The paranasal sinuses and mastoid air cells are well-aerated.  No significant soft tissue abnormalities are seen.  IMPRESSION:  1.  No acute intracranial pathology seen on CT. 2.  Mild chronic lacunar infarcts within the cerebellar hemispheres bilaterally.   Original Report Authenticated By: Santa Lighter, M.D.      1. Dizziness   2. Near syncope       MDM   Date: 10/23/2012  Rate: 76  Rhythm:  sinus rhythm  QRS Axis: normal  Intervals: normal  ST/T Wave abnormalities: normal  Conduction Disutrbances: first degree AV block, left anterior fascicular  block  Narrative Interpretation:   Old EKG Reviewed: None available   Initial contact with patient. Symptoms appeared to have started greater than 3 hours ago and symptoms are not classic stroke symptoms with no new deficits aside from dizziness and nausea which sounds like acute onset vertigo. Pt does not meet PTA criteria because he is out of time frame and symptoms have gotten mildly better.  Additional Exclusion Criteria for 4.5 hour admin of pta window are that he has hx of both diabetes AND stroke.   10:32PM - pt no longer nauseous but no improvement on dizziness.  10:52 PM - Dr. Dorna Mai has seen patient as well. Due to patients baseline deficits from previous stroke and no relief with Antivert we are unable to r/o posterior stroke vs presyncopal episode vs vertigo.  [facility], will admit to medicine for r/o.  Pt admitted to [facility] Dr. Hal Hope. He will place admitting orders after consulting with Neurology.          Linus Mako, [place] 10/23/12 2328

## 2012-10-23 NOTE — ED Notes (Signed)
Pt reports still feeling dizzy and it has not improved. The pressure that the pt was feeling on his chest has improved.

## 2012-10-23 NOTE — ED Notes (Signed)
Pt had reported some "pressure" in his chest.  Pt states that it is uncomfortable.  Pt is not SOB or diaphoretic.  Pt's EKG is NSR on the monitor.  Pt shifted in bed and said the pressure felt better. Tiffany, [place] made aware.

## 2012-10-23 NOTE — ED Notes (Signed)
Bed:WHALA<BR> Expected date:<BR> Expected time:<BR> Means of arrival:<BR> Comments:<BR> EMS/elderly/nausea and dizziness

## 2012-10-23 NOTE — ED Notes (Signed)
EKG given to EDP, Miller,MD.

## 2012-10-23 NOTE — ED Notes (Signed)
Per EMS report: pt from home: Pt reports feels dizzy every once in a while.  Dizziness usually goes away.  Today dizziness began today along with nausea but no vomiting.  The dizziness increases when standing but the dizziness has not subsided like it normally does.  EMS reports negative for stroke scale.  Pt has right sided weakness from hx of stroke, lower weakness and numbness (that felt more severe today but is gone now), edema of lower legs, and some SOB but all these Sx are normal to him. EMS reports negative for orthostatics BP sitting: 110/58, BP standing 140/70.  Last BP: 153/72, HR: 80, RR: 18, CBG: 187.  Pt also has a hx of "blockage" found by cath.  EMS's 12 lead shows some PVCs and 1st degree block.

## 2012-10-23 NOTE — ED Notes (Signed)
Pt has a foley catheter in place.

## 2012-10-24 ENCOUNTER — Encounter (HOSPITAL_COMMUNITY): Payer: Self-pay | Admitting: Internal Medicine

## 2012-10-24 ENCOUNTER — Inpatient Hospital Stay (HOSPITAL_COMMUNITY): Payer: Medicare Other

## 2012-10-24 DIAGNOSIS — I10 Essential (primary) hypertension: Secondary | ICD-10-CM

## 2012-10-24 DIAGNOSIS — E118 Type 2 diabetes mellitus with unspecified complications: Secondary | ICD-10-CM

## 2012-10-24 DIAGNOSIS — R42 Dizziness and giddiness: Secondary | ICD-10-CM

## 2012-10-24 DIAGNOSIS — G4733 Obstructive sleep apnea (adult) (pediatric): Secondary | ICD-10-CM

## 2012-10-24 DIAGNOSIS — I6350 Cerebral infarction due to unspecified occlusion or stenosis of unspecified cerebral artery: Principal | ICD-10-CM

## 2012-10-24 DIAGNOSIS — I369 Nonrheumatic tricuspid valve disorder, unspecified: Secondary | ICD-10-CM

## 2012-10-24 DIAGNOSIS — R209 Unspecified disturbances of skin sensation: Secondary | ICD-10-CM

## 2012-10-24 LAB — LIPID PANEL
Cholesterol: 178 mg/dL (ref 0–200)
Total CHOL/HDL Ratio: 5.1 RATIO
Triglycerides: 133 mg/dL (ref <150)
VLDL: 27 mg/dL (ref 0–40)

## 2012-10-24 LAB — CBC WITH DIFFERENTIAL/PLATELET
Basophils Relative: 1 % (ref 0–1)
Eosinophils Absolute: 0.3 10*3/uL (ref 0.0–0.7)
Eosinophils Relative: 4 % (ref 0–5)
HCT: 41.9 % (ref 39.0–52.0)
Hemoglobin: 14.1 g/dL (ref 13.0–17.0)
MCH: 29.9 pg (ref 26.0–34.0)
MCHC: 33.7 g/dL (ref 30.0–36.0)
MCV: 89 fL (ref 78.0–100.0)
Monocytes Absolute: 0.7 10*3/uL (ref 0.1–1.0)
Monocytes Relative: 9 % (ref 3–12)
Neutro Abs: 4.8 10*3/uL (ref 1.7–7.7)

## 2012-10-24 LAB — URINALYSIS, ROUTINE W REFLEX MICROSCOPIC
Bilirubin Urine: NEGATIVE
Glucose, UA: 100 mg/dL — AB
Protein, ur: NEGATIVE mg/dL
Urobilinogen, UA: 0.2 mg/dL (ref 0.0–1.0)

## 2012-10-24 LAB — GLUCOSE, CAPILLARY
Glucose-Capillary: 244 mg/dL — ABNORMAL HIGH (ref 70–99)
Glucose-Capillary: 117 mg/dL — ABNORMAL HIGH (ref 70–99)

## 2012-10-24 LAB — BASIC METABOLIC PANEL
BUN: 9 mg/dL (ref 6–23)
Chloride: 98 mEq/L (ref 96–112)
Creatinine, Ser: 0.62 mg/dL (ref 0.50–1.35)
Glucose, Bld: 248 mg/dL — ABNORMAL HIGH (ref 70–99)
Potassium: 4 mEq/L (ref 3.5–5.1)

## 2012-10-24 LAB — URINE MICROSCOPIC-ADD ON

## 2012-10-24 LAB — HEMOGLOBIN A1C: Mean Plasma Glucose: 298 mg/dL — ABNORMAL HIGH (ref <117)

## 2012-10-24 MED ORDER — INSULIN ASPART 100 UNIT/ML ~~LOC~~ SOLN
0.0000 [IU] | Freq: Three times a day (TID) | SUBCUTANEOUS | Status: DC
  Administered 2012-10-24: 2 [IU] via SUBCUTANEOUS
  Administered 2012-10-24 (×2): 5 [IU] via SUBCUTANEOUS
  Administered 2012-10-25 (×2): 1 [IU] via SUBCUTANEOUS

## 2012-10-24 MED ORDER — INSULIN ASPART PROT & ASPART (70-30 MIX) 100 UNIT/ML ~~LOC~~ SUSP
55.0000 [IU] | Freq: Two times a day (BID) | SUBCUTANEOUS | Status: DC
  Administered 2012-10-24 – 2012-10-26 (×6): 55 [IU] via SUBCUTANEOUS
  Filled 2012-10-24 (×2): qty 10

## 2012-10-24 MED ORDER — PERFLUTREN LIPID MICROSPHERE
1.0000 mL | INTRAVENOUS | Status: AC | PRN
  Administered 2012-10-24: 2 mL via INTRAVENOUS
  Filled 2012-10-24 (×2): qty 10

## 2012-10-24 MED ORDER — ISOSORBIDE MONONITRATE ER 30 MG PO TB24
30.0000 mg | ORAL_TABLET | Freq: Every day | ORAL | Status: DC
  Administered 2012-10-24 – 2012-10-26 (×3): 30 mg via ORAL
  Filled 2012-10-24 (×3): qty 1

## 2012-10-24 MED ORDER — SODIUM CHLORIDE 0.9 % IV SOLN
INTRAVENOUS | Status: DC
  Administered 2012-10-24 – 2012-10-26 (×4): via INTRAVENOUS

## 2012-10-24 MED ORDER — TRAMADOL HCL 50 MG PO TABS
50.0000 mg | ORAL_TABLET | Freq: Four times a day (QID) | ORAL | Status: DC | PRN
  Administered 2012-10-24 – 2012-10-26 (×5): 50 mg via ORAL
  Filled 2012-10-24 (×5): qty 1

## 2012-10-24 MED ORDER — CLOPIDOGREL BISULFATE 75 MG PO TABS
75.0000 mg | ORAL_TABLET | Freq: Every day | ORAL | Status: DC
  Administered 2012-10-24 – 2012-10-26 (×3): 75 mg via ORAL
  Filled 2012-10-24 (×4): qty 1

## 2012-10-24 MED ORDER — ASPIRIN EC 81 MG PO TBEC
81.0000 mg | DELAYED_RELEASE_TABLET | Freq: Every morning | ORAL | Status: DC
  Administered 2012-10-24 – 2012-10-26 (×3): 81 mg via ORAL
  Filled 2012-10-24 (×3): qty 1

## 2012-10-24 MED ORDER — INSULIN NPH ISOPHANE & REGULAR (70-30) 100 UNIT/ML ~~LOC~~ SUSP: 55.0000 [IU] | Freq: Two times a day (BID) | SUBCUTANEOUS | Status: DC

## 2012-10-24 MED ORDER — ENOXAPARIN SODIUM 40 MG/0.4ML ~~LOC~~ SOLN
40.0000 mg | SUBCUTANEOUS | Status: DC
  Administered 2012-10-24 – 2012-10-26 (×3): 40 mg via SUBCUTANEOUS
  Filled 2012-10-24 (×3): qty 0.4

## 2012-10-24 MED ORDER — OXYBUTYNIN CHLORIDE 5 MG PO TABS
5.0000 mg | ORAL_TABLET | Freq: Two times a day (BID) | ORAL | Status: DC | PRN
  Filled 2012-10-24 (×2): qty 1

## 2012-10-24 MED ORDER — METOPROLOL TARTRATE 50 MG PO TABS
50.0000 mg | ORAL_TABLET | Freq: Two times a day (BID) | ORAL | Status: DC
  Administered 2012-10-24 – 2012-10-26 (×6): 50 mg via ORAL
  Filled 2012-10-24 (×7): qty 1

## 2012-10-24 MED ORDER — PANTOPRAZOLE SODIUM 40 MG PO TBEC
40.0000 mg | DELAYED_RELEASE_TABLET | Freq: Every day | ORAL | Status: DC
  Administered 2012-10-24 – 2012-10-26 (×3): 40 mg via ORAL
  Filled 2012-10-24 (×3): qty 1

## 2012-10-24 MED ORDER — DIPHENHYDRAMINE HCL 25 MG PO CAPS: 25.0000 mg | ORAL_CAPSULE | Freq: Four times a day (QID) | ORAL | Status: DC | PRN

## 2012-10-24 MED ORDER — NITROGLYCERIN 0.4 MG SL SUBL: 0.4000 mg | SUBLINGUAL_TABLET | SUBLINGUAL | Status: DC | PRN

## 2012-10-24 NOTE — Evaluation (Signed)
Speech Language Pathology Evaluation Patient Details Name: DRESHAUN SHRIBER MRN: [id number] DOB: 12-08-1945 Today's Date: 10/24/2012 Time: [id number] SLP Time Calculation (min): 46 min  Problem List:  Patient Active Problem List  Diagnosis  . DM (diabetes mellitus) with complications  . Polyneuropathy in diabetes  . OSA (obstructive sleep apnea)  . Diabetic neuropathy, painful  . Painful bladder spasm  . Dehydration  . UTI (lower urinary tract infection)  . Hyperglycemia without ketosis  . Generalized headaches  . Hypertension  . Chest pain  . Dizziness  . NSVT (nonsustained ventricular tachycardia)  . SOB (shortness of breath)  . Hyponatremia  . Diarrhea  . CAD (coronary artery disease)  . Acute gastroenteritis  . CVA (cerebral infarction)  . Abdominal pain, right lower quadrant  . Hypokalemia  . Obstipation  . Paresthesia  . Vision disturbance  . Gait abnormality   Past Medical History:  Past Medical History  Diagnosis Date  . Obese   . Hypertension   . Diabetes mellitus   . Diabetic diarrhea   . Sleep apnea   . Diabetic neuropathy, painful   . Chronic indwelling foley catheter   . Bladder spasms   . Complication of anesthesia   . PONV (postoperative nausea and vomiting)   . Hyperlipidemia   . GERD (gastroesophageal reflux disease)   . Neurogenic bladder   . Stroke 2013   Past Surgical History:  Past Surgical History  Procedure Laterality Date  . Right tkr    . Gastroplasty    . Cholecystectomy    . Total knee arthroplasty     HPI:  Treyvonn Edgell Lunsford is a 67 y.o. male with previous history of CVA, hypertension, diabetes mellitus type 2, OSA uses CPAP, chronic diarrhea suddenly experienced dizziness last evening around 5 PM when he was shaving. The dizziness persisted with some nausea. Denies any chest pain palpitation or shortness of breath. Denies any headache difficulty speaking or swallowing. Patient had increased numbness of the right lower extremity  and briefly also had blurred vision. By the time patient reached ER he was out of the window period as per the ER physician. CT head is negative for anything acute. Patient on standing was found to be tachycardic but blood pressure did not drop. Even on lying down patient still feels dizzy. 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  Pt. becomes easily frustrated when this occurs, but responded well to compensatory strategies taught by this therapist (substitute with another word or providing a description).  Pt. was instructed to stay relaxed and to "laugh it off" rather than become frustrated, as that only intensifies the dysnomia in that moment of frustration.  Pt. is certainly able to communicate his needs and feelings in conversation in a manner adequate for his needs at home.  No further ST is needed at this time.  Pt. agrees.    SLP Assessment  Patient does not need any further Speech Lanaguage Pathology Services    Follow Up Recommendations  None    Frequency and Duration        Pertinent Vitals/Pain N/A   SLP Goals     SLP Evaluation Prior Functioning  Cognitive/Linguistic Baseline: Within functional limits Type of Home: House Lives With: Significant other Available Help at Discharge: Friend(s);Available PRN/intermittently Education: High school grad. Vocation: On disability (computer  programer Administrator, Civil Service)   Cognition  Overall Cognitive Status: Appears within functional limits for tasks assessed Arousal/Alertness: Awake/alert Orientation Level: Oriented X4 Attention: Divided Divided Attention: Appears intact Memory: Appears intact Awareness: Appears intact Problem Solving: Appears intact Safety/Judgment: Appears intact    Comprehension  Auditory  Comprehension Overall Auditory Comprehension: Appears within functional limits for tasks assessed Yes/No Questions: Within Functional Limits Conversation: Complex Visual Recognition/Discrimination Discrimination: Not tested Reading Comprehension Reading Status: Not tested (c/o right visual field cut)    Expression Expression Primary Mode of Expression: Verbal Verbal Expression Overall Verbal Expression: Appears within functional limits for tasks assessed Initiation: No impairment Level of Generative/Spontaneous Verbalization: Conversation Repetition: No impairment Naming: Impairment Responsive: 76-100% accurate Confrontation: Impaired Convergent: 75-100% accurate Divergent: 75-100% accurate Pragmatics: No impairment Non-Verbal Means of Communication: Not applicable Written Expression Dominant Hand: Right Written Expression: Not tested   Oral / Motor Oral Motor/Sensory Function Overall Oral Motor/Sensory Function: Appears within functional limits for tasks assessed Motor Speech Overall Motor Speech: Appears within functional limits for tasks assessed Respiration: Within functional limits Phonation: Normal Resonance: Within functional limits Articulation: Within functional limitis Intelligibility: Intelligible Motor Planning: Witnin functional limits Motor Speech Errors: Not applicable   GO     Quinn Axe T 10/24/2012, 2:29 PM

## 2012-10-24 NOTE — Progress Notes (Signed)
Echocardiogram 2D Echocardiogram has been performed.  [name] 10/24/2012, 12:13 PM

## 2012-10-24 NOTE — Consult Note (Signed)
Referring Physician: Laurann Montana    Chief Complaint: Right sided numbness, RLE weakness, dizziness  HPI: [name] is an 67 y.o. male who reports that he was at baseline until last evening.  He was watching TV and reports that he became lightheaded.  Denies vertigo.  Symptoms did not resolve.  Patient then noted numbness int he right hand and leg.  Noted heaviness in the right thigh as well.  Patient presented for evaluation at that time.  Head CT was performed and showed no acute changes. Patient reports a history of a stroke in the past that involved the right side of the body.  Was only left with a residual of right facial numbness.  Patient is on an ASA a day and Plavix at home.    LSN: [id number] 10/23/2012 tPA Given: No: Outside time window  Past Medical History  Diagnosis Date  . Obese   . Hypertension   . Diabetes mellitus   . Diabetic diarrhea   . Sleep apnea   . Diabetic neuropathy, painful   . Chronic indwelling foley catheter   . Bladder spasms   . Complication of anesthesia   . PONV (postoperative nausea and vomiting)   . Hyperlipidemia   . GERD (gastroesophageal reflux disease)   . Neurogenic bladder   . Stroke 2013    Past Surgical History  Procedure Laterality Date  . Right tkr    . Gastroplasty    . Cholecystectomy    . Total knee arthroplasty      Family History  Problem Relation Age of Onset  . Hypertension Mother   . Diabetes type I Father   . Cancer Brother      Testicular Cancer  . Cancer Brother     Leukemia   Social History:  reports that he quit smoking about 36 years ago. He has never used smokeless tobacco. He reports that he does not drink alcohol or use illicit drugs.  Allergies:  Allergies  Allergen Reactions  . Ace Inhibitors Swelling  . Cefadroxil Hives  . Cephalexin Hives  . Lipitor (Atorvastatin Calcium) Swelling  . Metformin And Related Swelling  . Methocarbamol Other (See Comments)    Feels like is in "rocky boat"  . Morphine  And Related Other (See Comments)    Sweating, feels like is "in rocky boat."    Medications:  I have reviewed the patient's current medications. Prior to Admission:  Prescriptions prior to admission  Medication Sig Dispense Refill  . aspirin EC 81 MG tablet Take 81 mg by mouth every morning.       . clopidogrel (PLAVIX) 75 MG tablet Take 1 tablet (75 mg total) by mouth daily with breakfast.  30 tablet  11  . diphenhydrAMINE (BENADRYL) 25 mg capsule Take 25 mg by mouth every 6 (six) hours as needed. For allergy.      . diphenoxylate-atropine (LOMOTIL) 2.5-0.025 MG per tablet Take 1 tablet by mouth 4 (four) times daily as needed. Diarrhea      . furosemide (LASIX) 40 MG tablet Take 40 mg by mouth daily.      . insulin NPH-insulin regular (NOVOLIN 70/30) (70-30) 100 UNIT/ML injection Inject 55 Units into the skin 2 (two) times daily with a meal.      . isosorbide mononitrate (IMDUR) 30 MG 24 hr tablet Take 1 tablet (30 mg total) by mouth daily.  30 tablet  11  . metoprolol tartrate (LOPRESSOR) 50 MG tablet Take 1 tablet (50 mg total) by mouth  2 (two) times daily.  60 tablet  11  . omeprazole (PRILOSEC) 20 MG capsule Take 20 mg by mouth daily.      Marland Kitchen oxybutynin (DITROPAN) 5 MG tablet Take 5 mg by mouth 2 (two) times daily as needed. For bladder pain.      . traMADol (ULTRAM) 50 MG tablet Take 50 mg by mouth every 6 (six) hours as needed for pain.      . nitroGLYCERIN (NITROSTAT) 0.4 MG SL tablet Place 1 tablet (0.4 mg total) under the tongue every 5 (five) minutes x 3 doses as needed for chest pain.  1 tablet  1   Scheduled: . aspirin EC  81 mg Oral q morning - 10a  . clopidogrel  75 mg Oral Q breakfast  . enoxaparin (LOVENOX) injection  40 mg Subcutaneous Q24H  . insulin aspart  0-9 Units Subcutaneous TID WC  . insulin aspart protamine-insulin aspart  55 Units Subcutaneous BID WC  . isosorbide mononitrate  30 mg Oral Daily  . metoprolol tartrate  50 mg Oral BID  . pantoprazole  40 mg Oral  Daily    ROS: History obtained from the patient  General ROS: negative for - chills, fatigue, fever, night sweats, weight gain or weight loss Psychological ROS: negative for - behavioral disorder, hallucinations, memory difficulties, mood swings or suicidal ideation Ophthalmic ROS: negative for - blurry vision, double vision, eye pain or loss of vision ENT ROS: negative for - epistaxis, nasal discharge, oral lesions, sore throat, tinnitus or vertigo Allergy and Immunology ROS: negative for - hives or itchy/watery eyes Hematological and Lymphatic ROS: negative for - bleeding problems, bruising or swollen lymph nodes Endocrine ROS: negative for - galactorrhea, hair pattern changes, polydipsia/polyuria or temperature intolerance Respiratory ROS: negative for - cough, hemoptysis, shortness of breath or wheezing Cardiovascular ROS: LE edema Gastrointestinal ROS: negative for - abdominal pain, diarrhea, hematemesis, nausea/vomiting or stool incontinence Genito-Urinary ROS: negative for - dysuria, hematuria, incontinence or urinary frequency/urgency Musculoskeletal ROS: negative for - joint swelling or muscular weakness Neurological ROS: as noted in HPI Dermatological ROS: negative for rash and skin lesion changes  Physical Examination: Blood pressure 122/58, pulse 71, temperature 96.8 F (36 C), temperature source Oral, resp. rate 18, height 6' (1.829 m), weight 171.505 kg (378 lb 1.6 oz), SpO2 97.00%.  Neurologic Examination: Mental Status: Alert, oriented, thought content appropriate.  Speech fluent without evidence of aphasia.  Able to follow 3 step commands without difficulty. Cranial Nerves: II: Discs flat bilaterally; Decreased right visual field, pupils equal, round, reactive to light and accommodation III,IV, VI: ptosis not present, extra-ocular motions intact bilaterally V,VII: smile symmetric, facial light touch sensation decreased on the right VIII: hearing normal  bilaterally IX,X: gag reflex present XI: bilateral shoulder shrug XII: midline tongue extension Motor: Right : Upper extremity   5/5    Left:     Upper extremity   5/5 Able to lift both lower extremities off of the bed simultaneously and symmetrically.  Distal strength 5/5 bilaterally Tone and bulk:normal tone throughout; no atrophy noted Sensory: Pinprick and light touch decreased in the right lower extremity and right hand Deep Tendon Reflexes: 2+ in the upper extremities and absent in the lower extremities.   Plantars: Right: mute   Left: mute Cerebellar: normal finger-to-nose and normal heel-to-shin test Gait:  gait and station unable to be tested CV: pulses palpable throughout   Laboratory Studies:  Basic Metabolic Panel:  Recent Labs Lab 10/23/12 2140 10/24/12 0425  NA 131*  134*  K 4.1 4.0  CL 95* 98  CO2 24 28  GLUCOSE 250* 248*  BUN 10 9  CREATININE 0.59 0.62  CALCIUM 8.7 8.3*    Liver Function Tests:  Recent Labs Lab 10/23/12 2140  AST 15  ALT 15  ALKPHOS 76  BILITOT 0.4  PROT 6.7  ALBUMIN 3.3*   No results found for this basename: LIPASE, AMYLASE,  in the last 168 hours No results found for this basename: AMMONIA,  in the last 168 hours  CBC:  Recent Labs Lab 10/23/12 2140 10/24/12 0425  WBC 10.2 7.7  NEUTROABS 8.0* 4.8  HGB 14.9 14.1  HCT 42.5 41.9  MCV 87.4 89.0  PLT 195 192    Cardiac Enzymes:  Recent Labs Lab 10/23/12 2140  TROPONINI <0.30    BNP: No components found with this basename: POCBNP,   CBG:  Recent Labs Lab 10/24/12 0657 10/24/12 1141  GLUCAP 244* 251*    Microbiology: Results for orders placed during the hospital encounter of 07/26/12  URINE CULTURE     Status: None   Collection Time    07/26/12  9:35 AM      Result Value Range Status   Specimen Description URINE, CATHETERIZED   Final   Special Requests NONE   Final   Culture  Setup Time 07/26/2012 15:05   Final   Colony Count >=100,000 COLONIES/ML    Final   Culture     Final   Value: Multiple bacterial morphotypes present, none predominant. Suggest appropriate recollection if clinically indicated.   Report Status 07/27/2012 FINAL   Final    Coagulation Studies:  Recent Labs  10/23/12 2140  LABPROT 13.3  INR 1.02    Urinalysis:  Recent Labs Lab 10/24/12 0545  COLORURINE YELLOW  LABSPEC 1.003*  PHURINE 7.0  GLUCOSEU 100*  HGBUR LARGE*  BILIRUBINUR NEGATIVE  KETONESUR NEGATIVE  PROTEINUR NEGATIVE  UROBILINOGEN 0.2  NITRITE NEGATIVE  LEUKOCYTESUR LARGE*    Lipid Panel:    Component Value Date/Time   CHOL 178 10/24/2012 0425   TRIG 133 10/24/2012 0425   HDL 35* 10/24/2012 0425   CHOLHDL 5.1 10/24/2012 0425   VLDL 27 10/24/2012 0425   LDLCALC 116* 10/24/2012 0425    HgbA1C:  Lab Results  Component Value Date   HGBA1C 9.5* 03/27/2012    Urine Drug Screen:     Component Value Date/Time   LABOPIA NONE DETECTED 10/10/2011 0253   COCAINSCRNUR NONE DETECTED 10/10/2011 0253   LABBENZ NONE DETECTED 10/10/2011 0253   AMPHETMU NONE DETECTED 10/10/2011 0253   THCU NONE DETECTED 10/10/2011 0253   LABBARB NONE DETECTED 10/10/2011 0253    Alcohol Level: No results found for this basename: ETH,  in the last 168 hours   Imaging: Dg Chest 2 View  10/23/2012  *RADIOLOGY REPORT*  Clinical Data: Shortness of breath and syncope.  CHEST - 2 VIEW  Comparison: Multiple exams, including 03/10/2012  Findings: Cardiothoracic index 57%, considered borderline enlarged based on the apical lordotic projection and AP semi upright technique. Lower thoracic spondylosis is observed.  The costophrenic angles are clipped posteriorly.  Borderline appearance for pulmonary venous hypertension.  IMPRESSION:  1.  Borderline cardiomegaly and borderline appearance for pulmonary venous hypertension. 2.  Lower thoracic spondylosis.   Original Report Authenticated By: Van Clines, M.D.    Ct Head Wo Contrast  10/23/2012  *RADIOLOGY REPORT*  Clinical Data:  Dizziness and nausea.  CT HEAD WITHOUT CONTRAST  Technique:  Contiguous axial images were obtained  from the base of the skull through the vertex without contrast.  Comparison: CT of the head performed 03/27/2012  Findings: There is no evidence of acute infarction, mass lesion, or intra- or extra-axial hemorrhage on CT.  Mild chronic lacunar infarcts are noted within the cerebellar hemispheres bilaterally.  Areas of increased attenuation within the basal ganglia and thalami are compatible with calcification, stable from the prior study.  The brainstem and fourth ventricle are within normal limits.  The third and lateral ventricles are unremarkable in appearance.  The cerebral hemispheres are symmetric in appearance, with normal gray- white differentiation.  No mass effect or midline shift is seen.  There is no evidence of fracture; visualized osseous structures are unremarkable in appearance.  The orbits are within normal limits. The paranasal sinuses and mastoid air cells are well-aerated.  No significant soft tissue abnormalities are seen.  IMPRESSION:  1.  No acute intracranial pathology seen on CT. 2.  Mild chronic lacunar infarcts within the cerebellar hemispheres bilaterally.   Original Report Authenticated By: Santa Lighter, M.D.     Assessment: 67 y.o. male presenting with acute onset right sided numbness, weakness and lightheadedness.  On exam with sensory findings and decrease in right visual field.  Focal weakness unable to be appreciated.  Lightheadedness continues intermittently.  Patient with multiple stroke risk factors.  Has had an infarct in the past (per his report undocumented on imaging but lacunes noted on current imaging).  CT shows no acute changes.  Patient on ASA and Plavix at home.   Echocardiogram shows no cardiac source of emboli.  Carotid dopplers show no significant stenosis.  LDL 116  Stroke Risk Factors - diabetes mellitus, hyperlipidemia and hypertension  Plan: 1. HgbA1c  pending 2. MRI, MRA  of the brain without contrast 3. PT consult, OT consult 4. Prophylactic therapy-Continue ASA and Plavix 5. Risk factor modification with adjustment of cholesterol lowering regimen to be considered for a target LDL of less than 100.   6. Telemetry monitoring 7. Frequent neuro checks  Alexis Goodell, MD [facility] [phone] 10/24/2012, 1:59 PM

## 2012-10-24 NOTE — Progress Notes (Signed)
   CARE MANAGEMENT NOTE 10/24/2012  Patient:  [name], [name]   Account Number:  [id number]  Date Initiated:  10/24/2012  Documentation initiated by:  Olga Coaster  Subjective/Objective Assessment:   ADMITTED WITH POSSIBLE TIA/ RULE OUT CVA     Action/Plan:   PCP: Irven Shelling, MD  LIVES AT HOME ALONE; [place]   Anticipated DC Date:  10/27/2012   Anticipated DC Plan:  [facility]  CM consult         Status of service:  In process, will continue to follow Medicare Important Message given?  NA - LOS <3 / Initial given by admissions (If response is "NO", the following Medicare IM given date fields will be blank)  Per UR Regulation:  Reviewed for med. necessity/level of care/duration of stay  Comments:  10/24/2012- B Amiere Cawley RN,BSN,MHA

## 2012-10-24 NOTE — H&P (Signed)
[facility] History and Physical  [name] [id number] DOB: October 19, 1945 DOA: 10/23/2012  Referring physician: Dr.Ghim. PCP: Irven Shelling, MD  Specialists: None.  Chief Complaint: Dizziness.  HPI: [name] is a 67 y.o. male with previous history of CVA, hypertension, diabetes mellitus type 2, OSA uses CPAP, chronic diarrhea suddenly experienced dizziness last evening around 5 PM when he was shaving. The dizziness persisted with some nausea. Denies any chest pain palpitation or shortness of breath. Denies any headache difficulty speaking or swallowing. Patient had increased numbness of the right lower extremity and briefly also had blurred vision. By the time patient reached ER he was out of the window period as per the ER physician. CT head is negative for anything acute. Patient on standing was found to be tachycardic but blood pressure did not drop. Even on lying down patient still feels dizzy. Patient will be admitted for further management primarily to rule out stroke.  Review of Systems: The patient denies anorexia, fever, weight loss,, vision loss, decreased hearing, hoarseness, chest pain, syncope, dyspnea on exertion, peripheral edema, balance deficits, hemoptysis, abdominal pain, melena, hematochezia, severe indigestion/heartburn, hematuria, incontinence, genital sores, muscle weakness, suspicious skin lesions, difficulty walking, depression, unusual weight change, abnormal bleeding, enlarged lymph nodes, angioedema, and breast masses. Patient had dizziness and numbness of the right lower extremity with transient blurring of vision.  Past Medical History  Diagnosis Date  . Obese   . Hypertension   . Diabetes mellitus   . Diabetic diarrhea   . Sleep apnea   . Diabetic neuropathy, painful   . Chronic indwelling foley catheter   . Bladder spasms   . Complication of anesthesia   . PONV (postoperative nausea and vomiting)   . Hyperlipidemia   . GERD  (gastroesophageal reflux disease)   . Neurogenic bladder   . Stroke 2013   Past Surgical History  Procedure Laterality Date  . Right tkr    . Gastroplasty    . Cholecystectomy    . Total knee arthroplasty     Social History:  reports that he quit smoking about 36 years ago. He has never used smokeless tobacco. He reports that he does not drink alcohol or use illicit drugs. Lives at home. where does patient live--home, ALF, SNF? and with whom if at home? Uses walker to move around. Can patient participate in ADLs?  Allergies  Allergen Reactions  . Ace Inhibitors Swelling  . Cefadroxil Hives  . Cephalexin Hives  . Lipitor (Atorvastatin Calcium) Swelling  . Metformin And Related Swelling  . Methocarbamol Other (See Comments)    Feels like is in "rocky boat"  . Morphine And Related Other (See Comments)    Sweating, feels like is "in rocky boat."    Family History  Problem Relation Age of Onset  . Hypertension Mother   . Diabetes type I Father   . Cancer Brother      Testicular Cancer  . Cancer Brother     Leukemia    Prior to Admission medications   Medication Sig Start Date End Date Taking? Authorizing Provider  aspirin EC 81 MG tablet Take 81 mg by mouth every morning.    Yes Historical Provider, MD  clopidogrel (PLAVIX) 75 MG tablet Take 1 tablet (75 mg total) by mouth daily with breakfast. 10/29/11 10/28/12 Yes Irven Shelling, MD  diphenhydrAMINE (BENADRYL) 25 mg capsule Take 25 mg by mouth every 6 (six) hours as needed. For allergy.   Yes Historical Provider, MD  diphenoxylate-atropine (LOMOTIL) 2.5-0.025 MG per tablet Take 1 tablet by mouth 4 (four) times daily as needed. Diarrhea   Yes Historical Provider, MD  furosemide (LASIX) 40 MG tablet Take 40 mg by mouth daily.   Yes Historical Provider, MD  insulin NPH-insulin regular (NOVOLIN 70/30) (70-30) 100 UNIT/ML injection Inject 55 Units into the skin 2 (two) times daily with a meal.   Yes Historical Provider, MD   isosorbide mononitrate (IMDUR) 30 MG 24 hr tablet Take 1 tablet (30 mg total) by mouth daily. 11/14/11 11/13/12 Yes Irven Shelling, MD  metoprolol tartrate (LOPRESSOR) 50 MG tablet Take 1 tablet (50 mg total) by mouth 2 (two) times daily. 11/14/11  Yes Irven Shelling, MD  omeprazole (PRILOSEC) 20 MG capsule Take 20 mg by mouth daily.   Yes Historical Provider, MD  oxybutynin (DITROPAN) 5 MG tablet Take 5 mg by mouth 2 (two) times daily as needed. For bladder pain.   Yes Historical Provider, MD  traMADol (ULTRAM) 50 MG tablet Take 50 mg by mouth every 6 (six) hours as needed for pain.   Yes Historical Provider, MD  nitroGLYCERIN (NITROSTAT) 0.4 MG SL tablet Place 1 tablet (0.4 mg total) under the tongue every 5 (five) minutes x 3 doses as needed for chest pain. 10/29/11 10/28/12  Irven Shelling, MD   Physical Exam: Filed Vitals:   10/23/12 2035 10/23/12 2038 10/23/12 2145 10/23/12 2343  BP: 147/68 129/105  142/60  Pulse: 77 101 74 74  Temp:    97.6 F (36.4 C)  TempSrc:    Oral  Resp:    13  Weight:      SpO2:   100% 97%     General:  Well-built and nourished. Not in distress.  Eyes: Anicteric no pallor. PERRLA positive.  ENT: No facial asymmetry tongue is midline. No discharge from ears eyes or nose. Tongue appears moist.  Neck: No obvious mass.  Cardiovascular: S1-S2 heard.  Respiratory: No rhonchi no crepitations.  Abdomen: Soft nontender bowel sounds present. Foley catheter  Skin: Dry skin of the lower extremities.  Musculoskeletal: Nontender.  Psychiatric: Appears normal.  Neurologic: Alert awake oriented to time place and person. No nystagmus. Able to see in both eyes. Has 5 x 5 muscle strength in both upper and lower extremities. No dysdiadochokinesis. No facial asymmetry tongue is midline.  Labs on Admission:  Basic Metabolic Panel:  Recent Labs Lab 10/23/12 2140  NA 131*  K 4.1  CL 95*  CO2 24  GLUCOSE 250*  BUN 10  CREATININE 0.59  CALCIUM 8.7    Liver Function Tests:  Recent Labs Lab 10/23/12 2140  AST 15  ALT 15  ALKPHOS 76  BILITOT 0.4  PROT 6.7  ALBUMIN 3.3*   No results found for this basename: LIPASE, AMYLASE,  in the last 168 hours No results found for this basename: AMMONIA,  in the last 168 hours CBC:  Recent Labs Lab 10/23/12 2140  WBC 10.2  NEUTROABS 8.0*  HGB 14.9  HCT 42.5  MCV 87.4  PLT 195   Cardiac Enzymes:  Recent Labs Lab 10/23/12 2140  TROPONINI <0.30    BNP (last 3 results)  Recent Labs  11/12/11 1007  PROBNP 675.3*   CBG: No results found for this basename: GLUCAP,  in the last 168 hours  Radiological Exams on Admission: Dg Chest 2 View  10/23/2012  *RADIOLOGY REPORT*  Clinical Data: Shortness of breath and syncope.  CHEST - 2 VIEW  Comparison: Multiple exams,  including 03/10/2012  Findings: Cardiothoracic index 57%, considered borderline enlarged based on the apical lordotic projection and AP semi upright technique. Lower thoracic spondylosis is observed.  The costophrenic angles are clipped posteriorly.  Borderline appearance for pulmonary venous hypertension.  IMPRESSION:  1.  Borderline cardiomegaly and borderline appearance for pulmonary venous hypertension. 2.  Lower thoracic spondylosis.   Original Report Authenticated By: Van Clines, M.D.    Ct Head Wo Contrast  10/23/2012  *RADIOLOGY REPORT*  Clinical Data: Dizziness and nausea.  CT HEAD WITHOUT CONTRAST  Technique:  Contiguous axial images were obtained from the base of the skull through the vertex without contrast.  Comparison: CT of the head performed 03/27/2012  Findings: There is no evidence of acute infarction, mass lesion, or intra- or extra-axial hemorrhage on CT.  Mild chronic lacunar infarcts are noted within the cerebellar hemispheres bilaterally.  Areas of increased attenuation within the basal ganglia and thalami are compatible with calcification, stable from the prior study.  The brainstem and fourth  ventricle are within normal limits.  The third and lateral ventricles are unremarkable in appearance.  The cerebral hemispheres are symmetric in appearance, with normal gray- white differentiation.  No mass effect or midline shift is seen.  There is no evidence of fracture; visualized osseous structures are unremarkable in appearance.  The orbits are within normal limits. The paranasal sinuses and mastoid air cells are well-aerated.  No significant soft tissue abnormalities are seen.  IMPRESSION:  1.  No acute intracranial pathology seen on CT. 2.  Mild chronic lacunar infarcts within the cerebellar hemispheres bilaterally.   Original Report Authenticated By: Santa Lighter, M.D.     EKG: Independently reviewed. Sinus rhythm.  Assessment/Plan Principal Problem:   Dizziness Active Problems:   DM (diabetes mellitus) with complications   OSA (obstructive sleep apnea)   Hypertension   1. Dizziness primary concern is to rule out CVA versus TIA - I have discussed with on-call neurologist who will be seeing patient in consult. MRI/MRA brain has been ordered along with 2-D echo and carotid Doppler. Swallow evaluation and neuro checks. Continue antiplatelet agents. 2. Uncontrolled diabetes mellitus type 2 - check hemoglobin A1c. Continue home medications with close followup of CBG with sliding scale. 3. Chronic indwelling Foley catheter - check UA. 4. OSA on CPAP - continue CPAP respiratory. 5. Hypertension - since patient was mildly tachycardic on standing and holding of patient's Lasix at this time and will gently hydrate. 6. Chronic diarrhea - denies any abdominal pain. Patient states he has chronic diarrhea from diabetic neuropathy. Denies having used any antibiotics recently 7. Previous history of CVA.  Discuss with neurologist on-call Dr. Aram Beecham. if consultant consulted, please document name and whether formally or informally consulted  Code Status: Full code. (must indicate code status--if  unknown or must be presumed, indicate so) Family Communication: None at the bedside. (indicate person spoken with, if applicable, with phone number if by telephone) Disposition Plan: Admit to inpatient.   Maragret Vanacker N. [facility] Pager [phone].  If 7PM-7AM, please contact night-coverage [email or web address] Password [email or web address] 10/24/2012, 12:22 AM

## 2012-10-24 NOTE — Progress Notes (Signed)
2054-Pt has refused CPAP for the night, Pt stated that he does not wear one at home.  RT to monitor and assess as needed.

## 2012-10-24 NOTE — Progress Notes (Signed)
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  There is a defect in vision in L eye only, he is not sure if this is new  Objective: Vital signs in last 24 hours: Temp:  [96.8 F (36 C)-97.9 F (36.6 C)] 96.8 F (36 C) (02/14 0449) Pulse Rate:  [70-101] 71 (02/14 0449) Resp:  [13-18] 18 (02/14 0449) BP: (115-153)/(58-105) 122/58 mmHg (02/14 1020) SpO2:  [97 %-100 %] 97 % (02/14 0449) Weight:  [171.505 kg (378 lb 1.6 oz)-175.542 kg (387 lb)] 171.505 kg (378 lb 1.6 oz) (02/14 0235) Weight change:  Last BM Date: 10/23/12  Intake/Output from previous day: 02/13 0701 - 02/14 0700 In: 47 [P.O.:47] Out: 1475 [Urine:1475] Intake/Output this shift:    General appearance: alert and cooperative Eyes: conjunctivae/corneas clear. PERRL, EOM's intact. Fundi benign., could not definately determine if visual field defect present Neck: no adenopathy, no carotid bruit, Resp: clear to auscultation bilaterally Cardio: regular rate and rhythm, S1, S2 normal, no murmur, click, rub or gallop GI: soft, non-tender; bowel sounds normal; no masses,  no organomegaly Extremities: extremities normal, atraumatic, no cyanosis or edema  Lab Results:  Recent Labs  10/23/12 2140 10/24/12 0425  WBC 10.2 7.7  HGB 14.9 14.1  HCT 42.5 41.9  PLT 195 192   BMET  Recent Labs  10/23/12 2140 10/24/12 0425  NA 131* 134*  K 4.1 4.0  CL 95* 98  CO2 24 28  GLUCOSE 250* 248*  BUN 10 9  CREATININE 0.59 0.62  CALCIUM 8.7 8.3*    Studies/Results: Dg Chest 2 View  10/23/2012  *RADIOLOGY REPORT*  Clinical Data: Shortness of breath and syncope.  CHEST - 2 VIEW  Comparison: Multiple exams, including 03/10/2012  Findings: Cardiothoracic index 57%, considered borderline enlarged based on the apical lordotic projection and AP semi upright technique. Lower thoracic  spondylosis is observed.  The costophrenic angles are clipped posteriorly.  Borderline appearance for pulmonary venous hypertension.  IMPRESSION:  1.  Borderline cardiomegaly and borderline appearance for pulmonary venous hypertension. 2.  Lower thoracic spondylosis.   Original Report Authenticated By: Van Clines, M.D.    Ct Head Wo Contrast  10/23/2012  *RADIOLOGY REPORT*  Clinical Data: Dizziness and nausea.  CT HEAD WITHOUT CONTRAST  Technique:  Contiguous axial images were obtained from the base of the skull through the vertex without contrast.  Comparison: CT of the head performed 03/27/2012  Findings: There is no evidence of acute infarction, mass lesion, or intra- or extra-axial hemorrhage on CT.  Mild chronic lacunar infarcts are noted within the cerebellar hemispheres bilaterally.  Areas of increased attenuation within the basal ganglia and thalami are compatible with calcification, stable from the prior study.  The brainstem and fourth ventricle are within normal limits.  The third and lateral ventricles are unremarkable in appearance.  The cerebral hemispheres are symmetric in appearance, with normal gray- white differentiation.  No mass effect or midline shift is seen.  There is no evidence of fracture; visualized osseous structures are unremarkable in appearance.  The orbits are within normal limits. The paranasal sinuses and mastoid air cells are well-aerated.  No significant soft tissue abnormalities are seen.  IMPRESSION:  1.  No acute intracranial pathology seen on CT. 2.  Mild chronic lacunar infarcts within the cerebellar hemispheres bilaterally.   Original Report Authenticated By: Santa Lighter, M.D.  Medications: I have reviewed the patient's current medications.  Assessment/Plan: Principal Problem:  Dizziness  Active Problems:  DM (diabetes mellitus) with complications  OSA (obstructive sleep apnea)  Hypertension   1. Dizziness primary concern is to rule out CVA versus  TIA - Apparently neurologist  will be seeing patient in consult. No MRI (has metal gastric staples)   2-D echo and carotid Doppler pending. Swallow evaluation, PT/OT. Continue antiplatelet agents. IV NS 2. Uncontrolled diabetes mellitus type 2 - check hemoglobin A1c. Continue home medications with close followup of CBG with sliding scale. 3. Chronic indwelling Foley catheter - no symptoms of UTI 4. Hypertension - holding lasix 5. Chronic diarrhea - denies any abdominal pain. Patient states he has chronic diarrhea from diabetic neuropathy. Denies having used any antibiotics recently 6. Previous history of CVA. 7. Visual loss OS, may need ophthamologic eval next week   LOS: 1 day   Yoseph Haile JOSEPH 10/24/2012, 12:29 PM

## 2012-10-24 NOTE — Progress Notes (Signed)
Bilateral:  No evidence of hemodynamically significant internal carotid artery stenosis.   Vertebral artery flow is antegrade.

## 2012-10-25 ENCOUNTER — Inpatient Hospital Stay (HOSPITAL_COMMUNITY): Payer: Medicare Other

## 2012-10-25 LAB — GLUCOSE, CAPILLARY
Glucose-Capillary: 130 mg/dL — ABNORMAL HIGH (ref 70–99)
Glucose-Capillary: 128 mg/dL — ABNORMAL HIGH (ref 70–99)
Glucose-Capillary: 90 mg/dL (ref 70–99)

## 2012-10-25 MED ORDER — DIPHENOXYLATE-ATROPINE 2.5-0.025 MG PO TABS
1.0000 | ORAL_TABLET | Freq: Four times a day (QID) | ORAL | Status: DC | PRN
  Administered 2012-10-25 – 2012-10-26 (×4): 1 via ORAL
  Filled 2012-10-25 (×4): qty 1

## 2012-10-25 NOTE — Progress Notes (Signed)
P.T. Called me to pts room, stated they were checking back w/him to see if he was done eating,( saw him approx 10 min before) and he was nonverbal.  Pt was able to make eye contact w/me, follow most commands but unable to speak to answer questions.  VSS, eyes tearing, RRT called, then pt was able to speak. C/o headache in posterior head. Dr Linda Hedges aware see new orders, EKG done and on chart

## 2012-10-25 NOTE — ED Provider Notes (Signed)
Medical screening examination/treatment/procedure(s) were conducted as a shared visit with non-physician practitioner(s) and myself.  I personally evaluated the patient during the encounter  Pt with persistent dizziness, almost symptoms of presyncope, has had  Prior stroke in the past, not improved here with treatment for vertigo.  Mild symptoms, do not favor TPA candidate and over 3 hours.  Past pointing with LUE, no sig drift.  Difficult to appreciate if symptoms are related to prior stroke or new neurologic deficit.  Pt will need admission, brain MRI.  Pt is agreeable with plan.    Saddie Benders. Dorna Mai, MD 10/25/12 [id number]

## 2012-10-25 NOTE — Progress Notes (Signed)
Subjective: Pateint admitted with symptoms of TIA/CVA in a setting of multiple medical problems. Reviewed Neurology notes. Consult Radiology about ability to have MRI w/ h/o  Gastroplasty.  Objective: Lab: Lab Results  Component Value Date   WBC 7.7 10/24/2012   HGB 14.1 10/24/2012   HCT 41.9 10/24/2012   MCV 89.0 10/24/2012   PLT 192 10/24/2012   BMET    Component Value Date/Time   NA 134* 10/24/2012 0425   K 4.0 10/24/2012 0425   CL 98 10/24/2012 0425   CO2 28 10/24/2012 0425   GLUCOSE 248* 10/24/2012 0425   BUN 9 10/24/2012 0425   CREATININE 0.62 10/24/2012 0425   CALCIUM 8.3* 10/24/2012 0425   GFRNONAA >90 10/24/2012 0425   GFRAA >90 10/24/2012 0425   A1C 12%  Imaging:  Scheduled Meds: . aspirin EC  81 mg Oral q morning - 10a  . clopidogrel  75 mg Oral Q breakfast  . enoxaparin (LOVENOX) injection  40 mg Subcutaneous Q24H  . insulin aspart  0-9 Units Subcutaneous TID WC  . insulin aspart protamine-insulin aspart  55 Units Subcutaneous BID WC  . isosorbide mononitrate  30 mg Oral Daily  . metoprolol tartrate  50 mg Oral BID  . pantoprazole  40 mg Oral Daily   Continuous Infusions: . sodium chloride 100 mL/hr at 10/24/12 2221   PRN Meds:.diphenhydrAMINE, nitroGLYCERIN, oxybutynin, traMADol   Physical Exam: Filed Vitals:   10/25/12 1031  BP: 127/58  Pulse: 75  Temp: 97.7 F (36.5 C)  Resp: 20    Intake/Output Summary (Last 24 hours) at 10/25/12 1209 Last data filed at 10/25/12 [id number]  Gross per 24 hour  Intake   3290 ml  Output   3401 ml  Net   -111 ml   Gen'l- obese white man in no disgtress HEENT- normal Cor- RRR Pulm - normal respirations Abd- obese, non-tender Neuro - A&O x 3,  No formal exam conducted.      Assessment/Plan: 1. Neuro  - patient with possible CVA. Plan -  MRI brain, MRA  2. DM - continue present regimen  5. Chronic diarrhea  Plan  lomotil   Adella Hare Doyle IM (o) [phone]; (c) [place]:  [phone]  10/25/2012, 11:58 AM

## 2012-10-25 NOTE — Progress Notes (Signed)
Subjective: Patient unchanged.  No new focal abnormalities.  Unable to have MRI due to weight.  Remainder of stroke work up unremarkable.    Objective: Current vital signs: BP 127/58  Pulse 75  Temp(Src) 97.7 F (36.5 C) (Oral)  Resp 20  Ht 6' (1.829 m)  Wt 171.505 kg (378 lb 1.6 oz)  BMI 51.27 kg/m2  SpO2 98% Vital signs in last 24 hours: Temp:  [97.7 F (36.5 C)-98.2 F (36.8 C)] 97.7 F (36.5 C) (02/15 1031) Pulse Rate:  [60-97] 75 (02/15 1031) Resp:  [18-20] 20 (02/15 1031) BP: (120-127)/(50-58) 127/58 mmHg (02/15 1031) SpO2:  [98 %] 98 % (02/15 1031)  Intake/Output from previous day: 02/14 0701 - 02/15 0700 In: 3170 [P.O.:360; I.V.:2810] Out: 1901 [Urine:1900; Stool:1] Intake/Output this shift: Total I/O In: 1280 [P.O.:480; I.V.:800] Out: 3000 [Urine:1500; Stool:1500] Nutritional status: Carb Control  Neurologic Exam: Mental Status:  Alert, oriented, thought content appropriate. Speech fluent without evidence of aphasia. Able to follow 3 step commands without difficulty.  Cranial Nerves:  II: Discs flat bilaterally; Decreased right visual field, pupils equal, round, reactive to light and accommodation  III,IV, VI: ptosis not present, extra-ocular motions intact bilaterally  V,VII: smile symmetric, facial light touch sensation decreased on the right  VIII: hearing normal bilaterally  IX,X: gag reflex present  XI: bilateral shoulder shrug  XII: midline tongue extension  Motor:  Right : Upper extremity 5/5    Left: Upper extremity 5/5  Able to lift both lower extremities off of the bed simultaneously and symmetrically. Distal strength 5/5 bilaterally  Tone and bulk:normal tone throughout; no atrophy noted  Sensory: Pinprick and light touch decreased in the right lower extremity and right hand  Deep Tendon Reflexes: 2+ in the upper extremities and absent in the lower extremities.  Plantars:  Right: mute    Left: mute      Lab Results: Basic Metabolic  Panel:  Recent Labs Lab 10/23/12 2140 10/24/12 0425  NA 131* 134*  K 4.1 4.0  CL 95* 98  CO2 24 28  GLUCOSE 250* 248*  BUN 10 9  CREATININE 0.59 0.62  CALCIUM 8.7 8.3*    Liver Function Tests:  Recent Labs Lab 10/23/12 2140  AST 15  ALT 15  ALKPHOS 76  BILITOT 0.4  PROT 6.7  ALBUMIN 3.3*   No results found for this basename: LIPASE, AMYLASE,  in the last 168 hours No results found for this basename: AMMONIA,  in the last 168 hours  CBC:  Recent Labs Lab 10/23/12 2140 10/24/12 0425  WBC 10.2 7.7  NEUTROABS 8.0* 4.8  HGB 14.9 14.1  HCT 42.5 41.9  MCV 87.4 89.0  PLT 195 192    Cardiac Enzymes:  Recent Labs Lab 10/23/12 2140  TROPONINI <0.30    Lipid Panel:  Recent Labs Lab 10/24/12 0425  CHOL 178  TRIG 133  HDL 35*  CHOLHDL 5.1  VLDL 27  LDLCALC 116*    CBG:  Recent Labs Lab 10/24/12 1141 10/24/12 1721 10/24/12 2107 10/25/12 0734 10/25/12 1217  GLUCAP 251* 169* 117* 101* 125*    Microbiology: Results for orders placed during the hospital encounter of 07/26/12  URINE CULTURE     Status: None   Collection Time    07/26/12  9:35 AM      Result Value Range Status   Specimen Description URINE, CATHETERIZED   Final   Special Requests NONE   Final   Culture  Setup Time 07/26/2012 15:05   Final  Colony Count >=100,000 COLONIES/ML   Final   Culture     Final   Value: Multiple bacterial morphotypes present, none predominant. Suggest appropriate recollection if clinically indicated.   Report Status 07/27/2012 FINAL   Final    Coagulation Studies:  Recent Labs  10/23/12 2140  LABPROT 13.3  INR 1.02    Imaging: Dg Chest 2 View  10/23/2012  *RADIOLOGY REPORT*  Clinical Data: Shortness of breath and syncope.  CHEST - 2 VIEW  Comparison: Multiple exams, including 03/10/2012  Findings: Cardiothoracic index 57%, considered borderline enlarged based on the apical lordotic projection and AP semi upright technique. Lower thoracic  spondylosis is observed.  The costophrenic angles are clipped posteriorly.  Borderline appearance for pulmonary venous hypertension.  IMPRESSION:  1.  Borderline cardiomegaly and borderline appearance for pulmonary venous hypertension. 2.  Lower thoracic spondylosis.   Original Report Authenticated By: Van Clines, M.D.    Ct Head Wo Contrast  10/23/2012  *RADIOLOGY REPORT*  Clinical Data: Dizziness and nausea.  CT HEAD WITHOUT CONTRAST  Technique:  Contiguous axial images were obtained from the base of the skull through the vertex without contrast.  Comparison: CT of the head performed 03/27/2012  Findings: There is no evidence of acute infarction, mass lesion, or intra- or extra-axial hemorrhage on CT.  Mild chronic lacunar infarcts are noted within the cerebellar hemispheres bilaterally.  Areas of increased attenuation within the basal ganglia and thalami are compatible with calcification, stable from the prior study.  The brainstem and fourth ventricle are within normal limits.  The third and lateral ventricles are unremarkable in appearance.  The cerebral hemispheres are symmetric in appearance, with normal gray- white differentiation.  No mass effect or midline shift is seen.  There is no evidence of fracture; visualized osseous structures are unremarkable in appearance.  The orbits are within normal limits. The paranasal sinuses and mastoid air cells are well-aerated.  No significant soft tissue abnormalities are seen.  IMPRESSION:  1.  No acute intracranial pathology seen on CT. 2.  Mild chronic lacunar infarcts within the cerebellar hemispheres bilaterally.   Original Report Authenticated By: Santa Lighter, M.D.     Medications:  I have reviewed the patient's current medications. Scheduled: . aspirin EC  81 mg Oral q morning - 10a  . clopidogrel  75 mg Oral Q breakfast  . enoxaparin (LOVENOX) injection  40 mg Subcutaneous Q24H  . insulin aspart  0-9 Units Subcutaneous TID WC  . insulin  aspart protamine-insulin aspart  55 Units Subcutaneous BID WC  . isosorbide mononitrate  30 mg Oral Daily  . metoprolol tartrate  50 mg Oral BID  . pantoprazole  40 mg Oral Daily    Assessment/Plan: 67 year old with new right sided complaints.  Likely ischemic in origin.  Work up unremarkable.  Patient unable to have MRI.  On ASA and Plavix  Recommendations: 1.  Patient may have MRI as an outpatient 2.  Continued therapy 3.  Continue ASA and Plavix 4.  No further neurologic intervention is recommended at this time.  If further questions arise, please call or page at that time.  Thank you for allowing neurology to participate in the care of this patient.    LOS: 2 days   Alexis Goodell, MD [facility] [phone] 10/25/2012  4:15 PM

## 2012-10-25 NOTE — Progress Notes (Signed)
Pt back in room, no deficits noted, pt able to speak and laugh now. Still w/mild headache

## 2012-10-25 NOTE — Progress Notes (Signed)
Received call 1555 hr: pt found less responsive with inability to speak. He was seen by [facility]: RN reports that he has non-focal findings, was hemodynamically stable. He did recover quickly: was able to recall event: he reports sudden on-set of severe occipital headache followed by inability to speak.  Plan CT brain w/o contrast to r/o ICH.  Note: he is too large for MRI/MRA at [facility].

## 2012-10-25 NOTE — Progress Notes (Signed)
Gone for stat head CT

## 2012-10-25 NOTE — Progress Notes (Signed)
PT note:  PT checked on pt this pm and pt stated that he would work with [place] once he was done eating.  Allowed approx 10 mins to pass before re-checking on pt and noted that [facility] was flat and tray had been pushed to the side.  Pt was lying in bed with eyes closed and noted some mild tremors.  Tried to awaken pt via voice and light touch, however pt did not respond, then applied sternal rub to pt with some mild response, however very slow.  RN notified to get to room.  Pt unable to verbally respond to any commands initially, however when asked "does your head hurt" (due to pt was rubbing head), he was able to shake his head "yes."  Obtained dynamap with BP 168/78, HR 78-80 and 100% SaO2.  Pt then able to slowly make verbal responses and state that he could hear [place] talking to him, however could not respond.  RN aware and to notify MD.  Will defer PT eval today and check back with pt tomorrow.   Thanks,  Cameron Sprang, PT [phone]

## 2012-10-26 DIAGNOSIS — E1149 Type 2 diabetes mellitus with other diabetic neurological complication: Secondary | ICD-10-CM

## 2012-10-26 DIAGNOSIS — E1142 Type 2 diabetes mellitus with diabetic polyneuropathy: Secondary | ICD-10-CM

## 2012-10-26 DIAGNOSIS — R197 Diarrhea, unspecified: Secondary | ICD-10-CM

## 2012-10-26 LAB — GLUCOSE, CAPILLARY: Glucose-Capillary: 116 mg/dL — ABNORMAL HIGH (ref 70–99)

## 2012-10-26 NOTE — Progress Notes (Addendum)
Patient refused to wear his CPAP. Will assess and monitor as needed. Encouraged pt to call RN if he changed his mind.

## 2012-10-26 NOTE — Evaluation (Signed)
Physical Therapy Evaluation Patient Details Name: [name] MRN: [id number] DOB: Mar 18, 1946 Today's Date: 10/26/2012 Time: 1009-1030 PT Time Calculation (min): 21 min  PT Assessment / Plan / Recommendation Clinical Impression  Pt presens with TIA like symptoms and dizziness.  Note that he has history of previous CVA with some slight residual right sided weakness (more so in hand).  Tolerated OOB and ambulation in hallway well with RW at min/guard assist for safety.  Pt will benefit from skilled PT in acute venue to address deficits.  PT recommends HHPT for follow up, however pt states his insurance will not cover.  We went over his HEP and strongly encourage him to perform everyday.     PT Assessment  Patient needs continued PT services    Follow Up Recommendations  No PT follow up;Home health PT;Other (comment) (prefer HHPT, however pt states HH not covered via insurance.)    Does the patient have the potential to tolerate intense rehabilitation      Barriers to Discharge Decreased caregiver support      Equipment Recommendations  None recommended by PT    Recommendations for Other Services OT consult   Frequency Min 3X/week    Precautions / Restrictions Precautions Precautions: Fall Precaution Comments: mild R sided weakness from old CVA (more in upper extremity) Restrictions Weight Bearing Restrictions: No   Pertinent Vitals/Pain No pain      Mobility  Bed Mobility Bed Mobility: Supine to Sit Supine to Sit: 6: Modified independent (Device/Increase time) Transfers Transfers: Sit to Stand;Stand to Sit Sit to Stand: 4: Min guard;With upper extremity assist;From bed Stand to Sit: 4: Min guard;With upper extremity assist;With armrests;To chair/3-in-1 Details for Transfer Assistance: Min/guard for safety with cues for hand placement and safety.  Ambulation/Gait Ambulation/Gait Assistance: 4: Min guard;5: Supervision Ambulation Distance (Feet): 55 Feet Assistive  device: Rolling walker Ambulation/Gait Assistance Details: Cues for maintaining position inside of RW and to maitnain upright posture throughout.  Noted that pt gets SOB, however he states that this is chronic and is due to him being mostly sedentary at home.  Gait Pattern: Step-through pattern;Decreased stride length;Trunk flexed Gait velocity: decreased Stairs: No Wheelchair Mobility Wheelchair Mobility: No    Exercises Other Exercises Other Exercises: Pt states he has HEP at home and went over several of the exercises.  Strongly encouraged him to do them at least once a day since he states he was onlyl doing them 2-3 times per week.    PT Diagnosis: Difficulty walking;Generalized weakness  PT Problem List: Decreased strength;Decreased activity tolerance;Decreased balance;Decreased mobility;Decreased knowledge of use of DME;Impaired sensation PT Treatment Interventions: DME instruction;Gait training;Stair training;Functional mobility training;Therapeutic activities;Therapeutic exercise;Balance training;Patient/family education   PT Goals Acute Rehab PT Goals PT Goal Formulation: With patient Time For Goal Achievement: 11/02/12 Potential to Achieve Goals: Good Pt will go Sit to Stand: with modified independence PT Goal: Sit to Stand - Progress: Updated due to goal met Pt will Ambulate: 51 - 150 feet;with least restrictive assistive device;with modified independence PT Goal: Ambulate - Progress: Goal set today Pt will Go Up / Down Stairs: 1-2 stairs;with supervision;with least restrictive assistive device PT Goal: Up/Down Stairs - Progress: Goal set today Pt will Perform Home Exercise Program: with supervision, verbal cues required/provided PT Goal: Perform Home Exercise Program - Progress: Goal set today  Visit Information  Last PT Received On: 10/26/12 Assistance Needed: +1    Subjective Data  Subjective: I'm doing better today Patient Stated Goal: to return home.  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 difficulties    Solicitor Overall Cognitive Status: Appears within functional limits for tasks assessed/performed Arousal/Alertness: Awake/alert Orientation Level: Appears intact for tasks assessed Behavior During Session: [facility] for tasks performed    Extremity/Trunk Assessment Right Upper Extremity Assessment RUE Sensation: Deficits RUE Sensation Deficits: pt with decreased sensation in C6 dermatome (this is not new) Right Lower Extremity Assessment RLE ROM/Strength/Tone: WFL for tasks assessed RLE Sensation: WFL - Light Touch Left Lower Extremity Assessment LLE ROM/Strength/Tone: WFL for tasks assessed LLE Sensation: WFL - Light Touch Trunk Assessment Trunk Assessment: Kyphotic   Balance    End of Session PT - End of Session Activity Tolerance: Patient tolerated treatment well Patient left: in chair;with call bell/phone within reach Nurse Communication: Mobility status  GP     Denice Bors 10/26/2012, 11:57 AM

## 2012-10-26 NOTE — Discharge Summary (Signed)
[name], [name]              ACCOUNT NO.:  [id number]  MEDICAL RECORD NO.:  [id number]  LOCATION:  1425                         FACILITY:  [facility]  PHYSICIAN:  Heinz Knuckles. Norins, MD  DATE OF BIRTH:  Jun 01, 1946  DATE OF ADMISSION:  10/23/2012 DATE OF DISCHARGE:  10/26/2012                              DISCHARGE SUMMARY   ADMITTING DIAGNOSES: 1. Dizziness with concern for cerebrovascular accident versus     transient ischemic attack. 2. Uncontrolled type 2 diabetes. 3. Chronic indwelling Foley. 4. Obstructive sleep apnea, on CPAP. 5. Hypertension. 6. Chronic diarrhea.  DISCHARGE DIAGNOSES: 1. Dizziness with concern for cerebrovascular accident versus     transient ischemic attack. 2. Uncontrolled type 2 diabetes. 3. Chronic indwelling Foley. 4. Obstructive sleep apnea, on CPAP. 5. Hypertension. 6. Chronic diarrhea.  CONSULTANTS:  Dr. Doy Mince for Neurology.  PROCEDURES: 1. Chest x-ray performed on October 23, 2012 with borderline     cardiomegaly and borderline appearance for pulmonary venous     hypertension.  Lower thoracic spondylosis is noted. 2. CT of the head without contrast, October 23, 2012, read as no     acute intracranial pathology.  Mild chronic lacunar infarcts within     the cerebellar hemispheres bilaterally. 3. CT scan October 25, 2012 with no acute intracranial abnormality.     No change since most recent study.  HISTORY OF PRESENT ILLNESS:  [name] is a 67 year old gentleman with a history of prior CVA as well as hypertension and diabetes, obstructive sleep apnea, chronic diarrhea, who had the sudden onset of dizziness on the evening prior to admission about 5 p.m. while shaving.  This dizziness persisted and was accompanied by mild nausea.  He denied any chest pain, palpitations, or shortness of breath.  He denied any headache, difficulty speaking or swallowing.  The patient had increased numbness in the right lower extremity and briefly  also had blurred vision.  The patient called EMS and by the time he was brought to the emergency department, he was out of the window for tPA.  CT scan done in the ER was negative for any acute findings.  The patient was found to be tachycardic with standing but blood pressure did not drop.  The patient continued to have problems with dizziness.  He was admitted for further management and to rule out CVA.  Please see the H and P for past medical history, family history, social history.  HOSPITAL COURSE:  The patient was admitted to a telemetry unit.  He was seen in consultation by Neurology and found his examination to be nonfocal, but there was concern for possible CVA.  The patient was recommended to have MRI and MRA of the brain; however, his weight prohibited doing this study in the hospital.  Dr. Doy Mince recommended continuing him on aspirin and Plavix.  She felt if needed he could have an MRI scan of the brain as an outpatient, either at [place] [facility] or [facility] that has bigger scanners.  The patient was seen and evaluated by PT only.  It was recommended the patient have continued physical therapy and did recommend home physical therapy.  The patient reports that he  has a 45 dollar co-pay per visit which is beyond his means and could not afford to have outpatient home based physical therapy.  With the patient's evaluation being completed with his symptoms having resolved and not recurred, with Neurology having no further recommendations for inpatient evaluation and treatment, at this point he is stable and ready for discharge to home.  He will continue on aspirin and Plavix.  He will contact [facility] for followup appointment with [name], his outpatient physician, particularly in regard to ordering followup MRI of the brain.  2. Diabetes.  The patient's diabetes is out of control.  His last hemoglobin A1c was 12%.  This  has been a longstanding problem for the patient in regard to adherence, although he does claim he takes his medications as instructed.  Plan is for the patient to continue his home regimen and follow up with [name] for adjustments as indicated.  3. Chronic indwelling Foley.  This will be continued.  The patient had no symptoms of UTI.  4. Hypertension.  The patient will resume his home medications including Lasix.  5. Chronic diarrhea.  The patient has had no abdominal pain or discomfort. 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  New treatments are indicated.  6. Visual loss, left eye.  The patient will need a followup with Ophthalmology.  DISCHARGE EXAMINATION:  Vital signs:  Temperature was 97.6, blood pressure 132/64, pulse was 63 respirations 19, oxygen saturation is 100%.  General appearance:  This is a morbidly obese Caucasian gentleman lying in bed, who is in no acute distress.  HEENT:  Conjunctivae and sclerae were clear.  Pupils equal, round, and reactive.  Extraocular muscles were intact.  Neck:  Supple.  Chest:  The patient is moving air well.  No rales, wheezes, or rhonchi.  No increased work of breathing. Cardiovascular:  The patient had a quiet precordium.  His heart rate was regular.  Abdomen was morbidly obese.  Extremities:  Without clubbing, cyanosis, or edema.  Neurologic:  The patient is awake, alert.  He is oriented to person, place, time and context.  His speech is clear and fluent.  Cognition seems normal.  Cranial nerves II-XII were grossly intact with normal facial symmetry and movement.  Extraocular muscles were intact.  Visual field testing was not performed.  Motor strength: The patient moves all of his extremities spontaneously.  Cerebellar: The patient has no tremor.  Sensation was not tested.  FINAL LABORATORY DATA:  Chemistries from October 24, 2012, with a sodium of 134, potassium of 4, chloride 98, CO2 28, BUN 9,  creatinine 0.62, glucose was 248.  Liver functions from October 23, 2012, were normal except for an albumin slightly low at 3.3.  Cholesterol is 178, triglycerides 133, HDL was 35, and LDL was 116.  CBC on the day of discharge with a hemoglobin of 14.1 g, white count 7700, platelet count 192,000.  Hemoglobin A1c was 12%.  Thyroid function was normal with a TSH of 2.48.  DISCHARGE MEDICATIONS:  The patient will resume his home medications including aspirin 81 mg daily, Plavix 75 mg daily, Benadryl 25 mg q.6 h. as needed for allergy, Lomotil 1 tablet by mouth 4 times daily as needed for diarrhea, Lasix 40 mg daily, NPH 55 units b.i.d. with meals, isosorbide mononitrate 30 mg daily, metoprolol tartrate 50 mg b.i.d., omeprazole 20 mg q.a.m., oxybutynin 5 mg b.i.d., tramadol 50 mg q.6 h. p.r.n. pain, sublingual nitroglycerin 0.4 mg p.r.n.  DISPOSITION:  The patient is discharged to home.  He is instructed to call [facility] on Monday to set up a hospital followup appointment with [name] within 7-10 days.  The patient's condition at time of discharge dictation is neurologically stable, but overall guarded given his multiple comorbidities.     Heinz Knuckles Norins, MD     MEN/MEDQ  D:  10/26/2012  T:  10/26/2012  Job:  [id number]

## 2012-10-26 NOTE — Plan of Care (Signed)
Problem: Phase III Progression Outcomes Goal: Voiding independently Outcome: Not Applicable Date Met:  99991111 Pt has con't Foley, due to neurogenic bladder

## 2012-10-26 NOTE — Progress Notes (Signed)
Subjective: Patient had severe headache yesterday but this resovled. He cannot have MRI due to size. Neurology has signed off. PT note reviewed: recommendation is for HHPT but patient says he does not have coverage of the same.   Patient wishes to go home  Objective: Lab: Lab Results  Component Value Date   WBC 7.7 10/24/2012   HGB 14.1 10/24/2012   HCT 41.9 10/24/2012   MCV 89.0 10/24/2012   PLT 192 10/24/2012   BMET    Component Value Date/Time   NA 134* 10/24/2012 0425   K 4.0 10/24/2012 0425   CL 98 10/24/2012 0425   CO2 28 10/24/2012 0425   GLUCOSE 248* 10/24/2012 0425   BUN 9 10/24/2012 0425   CREATININE 0.62 10/24/2012 0425   CALCIUM 8.3* 10/24/2012 0425   GFRNONAA >90 10/24/2012 0425   GFRAA >90 10/24/2012 0425     Imaging:  CT head 2/15 - Findings: Stable calcifications in the basal ganglia and thalami  bilaterally. Small lacunar infarcts again noted in the cerebellar  hemispheres, stable. No acute infarction, hemorrhage or  hydrocephalus. No mass lesion. No midline shift. No acute  calvarial abnormality. Visualized paranasal sinuses and mastoids  clear. Orbital soft tissues unremarkable.  IMPRESSION:  No acute intracranial abnormality.  No change since recent study.  Scheduled Meds: . aspirin EC  81 mg Oral q morning - 10a  . clopidogrel  75 mg Oral Q breakfast  . enoxaparin (LOVENOX) injection  40 mg Subcutaneous Q24H  . insulin aspart  0-9 Units Subcutaneous TID WC  . insulin aspart protamine-insulin aspart  55 Units Subcutaneous BID WC  . isosorbide mononitrate  30 mg Oral Daily  . metoprolol tartrate  50 mg Oral BID  . pantoprazole  40 mg Oral Daily   Continuous Infusions: . sodium chloride 100 mL/hr at 10/26/12 1007   PRN Meds:.diphenhydrAMINE, diphenoxylate-atropine, nitroGLYCERIN, oxybutynin, traMADol   Physical Exam: Filed Vitals:   10/26/12 1415  BP: 132/64  Pulse: 63  Temp: 97.6 F (36.4 C)  Resp: 19    See d/c summary for discharge physical  exam    Assessment/Plan: For d/c home. Dictated [id number].   Adella Hare [facility] (o) [phone]; (c) [place]  Tele: [phone]  10/26/2012, 3:44 PM

## 2012-10-26 NOTE — Progress Notes (Signed)
10/26/12-01:30am--The pt took his telemetry monitor off, & told the tech he wasn't putting it back on. I ask the pt, what the problem was, & he lifted up his gown & he had many stickers all over his very haired body, many in incorrect places. He complained that he had not been shaved or clipped prior to any of the stickers applications, & c/o everytime a lead is off, the person that comes to fix the lead, doesn't remove the old sticker, they just apply a new one. I offered to let the tech clip his chest & abdomen where leads were to be applied, he didn't refuse to have this done, but con't to argue, & said I will just wait til the am & talk to the dr about going home. I told the pt that if he refuses to wear the monitor, I have to call at this time & let the dr or on call personal know that he is refusing to wear the telemetry monitor. I offered to take the ones off that were not in correct placement, & apply the stickers where they would be appropriate, he aggreed to this at this time. The monitor was reapplied at 01:50am.

## 2013-09-30 DIAGNOSIS — R339 Retention of urine, unspecified: Secondary | ICD-10-CM | POA: Diagnosis not present

## 2013-09-30 DIAGNOSIS — N401 Enlarged prostate with lower urinary tract symptoms: Secondary | ICD-10-CM | POA: Diagnosis not present

## 2013-10-26 ENCOUNTER — Encounter (HOSPITAL_COMMUNITY): Payer: Self-pay | Admitting: Emergency Medicine

## 2013-10-26 ENCOUNTER — Emergency Department (HOSPITAL_COMMUNITY)
Admission: EM | Admit: 2013-10-26 | Discharge: 2013-10-26 | Disposition: A | Discharge status: Home / Self Care | Payer: Medicare Other | Attending: Emergency Medicine | Admitting: Emergency Medicine

## 2013-10-26 DIAGNOSIS — Z8673 Personal history of transient ischemic attack (TIA), and cerebral infarction without residual deficits: Secondary | ICD-10-CM | POA: Insufficient documentation

## 2013-10-26 DIAGNOSIS — IMO0002 Reserved for concepts with insufficient information to code with codable children: Secondary | ICD-10-CM | POA: Diagnosis not present

## 2013-10-26 DIAGNOSIS — Z9889 Other specified postprocedural states: Secondary | ICD-10-CM | POA: Insufficient documentation

## 2013-10-26 DIAGNOSIS — M545 Low back pain, unspecified: Secondary | ICD-10-CM

## 2013-10-26 DIAGNOSIS — E1142 Type 2 diabetes mellitus with diabetic polyneuropathy: Secondary | ICD-10-CM | POA: Insufficient documentation

## 2013-10-26 DIAGNOSIS — E669 Obesity, unspecified: Secondary | ICD-10-CM | POA: Insufficient documentation

## 2013-10-26 DIAGNOSIS — M549 Dorsalgia, unspecified: Secondary | ICD-10-CM | POA: Diagnosis not present

## 2013-10-26 DIAGNOSIS — Z79899 Other long term (current) drug therapy: Secondary | ICD-10-CM | POA: Diagnosis not present

## 2013-10-26 DIAGNOSIS — K219 Gastro-esophageal reflux disease without esophagitis: Secondary | ICD-10-CM | POA: Insufficient documentation

## 2013-10-26 DIAGNOSIS — Z87448 Personal history of other diseases of urinary system: Secondary | ICD-10-CM | POA: Diagnosis not present

## 2013-10-26 DIAGNOSIS — I10 Essential (primary) hypertension: Secondary | ICD-10-CM | POA: Diagnosis not present

## 2013-10-26 DIAGNOSIS — E1149 Type 2 diabetes mellitus with other diabetic neurological complication: Secondary | ICD-10-CM | POA: Insufficient documentation

## 2013-10-26 DIAGNOSIS — Y929 Unspecified place or not applicable: Secondary | ICD-10-CM | POA: Insufficient documentation

## 2013-10-26 DIAGNOSIS — X500XXA Overexertion from strenuous movement or load, initial encounter: Secondary | ICD-10-CM | POA: Insufficient documentation

## 2013-10-26 DIAGNOSIS — M546 Pain in thoracic spine: Secondary | ICD-10-CM | POA: Diagnosis not present

## 2013-10-26 DIAGNOSIS — Z87891 Personal history of nicotine dependence: Secondary | ICD-10-CM | POA: Insufficient documentation

## 2013-10-26 DIAGNOSIS — Y9389 Activity, other specified: Secondary | ICD-10-CM | POA: Insufficient documentation

## 2013-10-26 DIAGNOSIS — Z794 Long term (current) use of insulin: Secondary | ICD-10-CM | POA: Insufficient documentation

## 2013-10-26 DIAGNOSIS — Z7982 Long term (current) use of aspirin: Secondary | ICD-10-CM | POA: Diagnosis not present

## 2013-10-26 DIAGNOSIS — R52 Pain, unspecified: Secondary | ICD-10-CM | POA: Diagnosis not present

## 2013-10-26 MED ORDER — CYCLOBENZAPRINE HCL 10 MG PO TABS: 10.0000 mg | ORAL_TABLET | Freq: Two times a day (BID) | ORAL | Status: DC | PRN

## 2013-10-26 MED ORDER — HYDROMORPHONE HCL PF 1 MG/ML IJ SOLN: 1.0000 mg | Freq: Once | INTRAMUSCULAR | Status: DC

## 2013-10-26 MED ORDER — KETOROLAC TROMETHAMINE 15 MG/ML IJ SOLN
30.0000 mg | Freq: Once | INTRAMUSCULAR | Status: AC
  Administered 2013-10-26: 30 mg via INTRAVENOUS
  Filled 2013-10-26: qty 2

## 2013-10-26 MED ORDER — HYDROMORPHONE HCL PF 1 MG/ML IJ SOLN
0.5000 mg | Freq: Once | INTRAMUSCULAR | Status: AC
  Administered 2013-10-26: 0.5 mg via INTRAVENOUS
  Filled 2013-10-26: qty 1

## 2013-10-26 MED ORDER — DIAZEPAM 5 MG PO TABS
5.0000 mg | ORAL_TABLET | Freq: Once | ORAL | Status: AC
  Administered 2013-10-26: 5 mg via ORAL
  Filled 2013-10-26: qty 1

## 2013-10-26 MED ORDER — HYDROCODONE-ACETAMINOPHEN 5-325 MG PO TABS: 1.0000 | ORAL_TABLET | ORAL | Status: DC | PRN

## 2013-10-26 NOTE — ED Notes (Signed)
[facility] called  

## 2013-10-26 NOTE — ED Provider Notes (Signed)
CSN: [id number]     Arrival date & time 10/26/13  1357 History   First MD Initiated Contact with Patient 10/26/13 1403     Chief Complaint  Patient presents with  . Back Pain     HPI Patient reports worsening low back pain today after lifting a heavy box at weight approximately 100 pounds.  He states he felt a pop in his back and then began feeling the pain.  He states his pain radiating his bilateral buttocks.  No abdominal pain.  No nausea or vomiting.  No diarrhea.  No weakness in his arms or leg.  No headache or neck injury.  A recent back injury except for the heavy lifting today.   Past Medical History  Diagnosis Date  . Obese   . Hypertension   . Diabetes mellitus   . Diabetic diarrhea   . Sleep apnea   . Diabetic neuropathy, painful   . Chronic indwelling Foley catheter   . Bladder spasms   . Complication of anesthesia   . PONV (postoperative nausea and vomiting)   . Hyperlipidemia   . GERD (gastroesophageal reflux disease)   . Neurogenic bladder   . Stroke 2013   Past Surgical History  Procedure Laterality Date  . Right tkr    . Gastroplasty    . Cholecystectomy    . Total knee arthroplasty     Family History  Problem Relation Age of Onset  . Hypertension Mother   . Diabetes type I Father   . Cancer Brother      Testicular Cancer  . Cancer Brother     Leukemia   History  Substance Use Topics  . Smoking status: Former Smoker -- 2.00 packs/day for 20 years    Quit date: 09/10/1976  . Smokeless tobacco: Never Used  . Alcohol Use: No     Comment: FORMER ALCOHOLIC WITH 23 YEAR SOBRIETY    Review of Systems  All other systems reviewed and are negative.      Allergies  Ace inhibitors; Cefadroxil; Cephalexin; Lipitor; Metformin and related; Methocarbamol; and Morphine and related  Home Medications   Current Outpatient Rx  Name  Route  Sig  Dispense  Refill  . aspirin EC 81 MG tablet   Oral   Take 81 mg by mouth every morning.          .  diphenhydrAMINE (BENADRYL) 25 mg capsule   Oral   Take 25 mg by mouth every 6 (six) hours as needed. For allergy.         . diphenoxylate-atropine (LOMOTIL) 2.5-0.025 MG per tablet   Oral   Take 1 tablet by mouth 4 (four) times daily as needed. Diarrhea         . furosemide (LASIX) 40 MG tablet   Oral   Take 40 mg by mouth daily.         . insulin NPH-insulin regular (NOVOLIN 70/30) (70-30) 100 UNIT/ML injection   Subcutaneous   Inject 55 Units into the skin 2 (two) times daily with a meal.         . EXPIRED: isosorbide mononitrate (IMDUR) 30 MG 24 hr tablet   Oral   Take 1 tablet (30 mg total) by mouth daily.   30 tablet   11   . metoprolol tartrate (LOPRESSOR) 50 MG tablet   Oral   Take 1 tablet (50 mg total) by mouth 2 (two) times daily.   60 tablet   11   . EXPIRED:  nitroGLYCERIN (NITROSTAT) 0.4 MG SL tablet   Sublingual   Place 1 tablet (0.4 mg total) under the tongue every 5 (five) minutes x 3 doses as needed for chest pain.   1 tablet   1   . omeprazole (PRILOSEC) 20 MG capsule   Oral   Take 20 mg by mouth daily.         Marland Kitchen oxybutynin (DITROPAN) 5 MG tablet   Oral   Take 5 mg by mouth 2 (two) times daily as needed. For bladder pain.         . traMADol (ULTRAM) 50 MG tablet   Oral   Take 50 mg by mouth every 6 (six) hours as needed for pain.          BP 159/78  Pulse 77  Temp(Src) 97.8 F (36.6 C) (Oral)  Resp 18  SpO2 99% Physical Exam  Nursing note and vitals reviewed. Constitutional: He is oriented to person, place, and time. He appears well-developed and well-nourished.  HENT:  Head: Normocephalic and atraumatic.  Eyes: EOM are normal.  Neck: Normal range of motion.  Cardiovascular: Normal rate, regular rhythm, normal heart sounds and intact distal pulses.   Pulmonary/Chest: Effort normal and breath sounds normal. No respiratory distress.  Abdominal: Soft. He exhibits no distension. There is no tenderness.  Musculoskeletal: Normal  range of motion.  5 Out of 5 strength in bilateral lower extremity major muscle groups.  Neurological: He is alert and oriented to person, place, and time.  Skin: Skin is warm and dry.  Psychiatric: He has a normal mood and affect. Judgment normal.    ED Course  Procedures (including critical care time) Labs Review Labs Reviewed - No data to display Imaging Review No results found.  EKG Interpretation   None       MDM   Final diagnoses:  Low back pain    4:09 PM Patient feels much better after medicine here.  Home with a short course muscle relaxants and pain medication. Normal lower extremity neurologic exam. No bowel or bladder complaints. No back pain red flags. Likely musculoskeletal back pain. Doubt spinal epidural abscess. Doubt cauda equina. Doubt abdominal aortic aneurysm     Hoy Morn, MD 10/26/13 [phone]

## 2013-10-26 NOTE — Discharge Instructions (Signed)
Back Exercises Back exercises help treat and prevent back injuries. The goal of back exercises is to increase the strength of your abdominal and back muscles and the flexibility of your back. These exercises should be started when you no longer have back pain. Back exercises include:  Pelvic Tilt. Lie on your back with your knees bent. Tilt your pelvis until the lower part of your back is against the floor. Hold this position 5 to 10 sec and repeat 5 to 10 times.  Knee to Chest. Pull first 1 knee up against your chest and hold for 20 to 30 seconds, repeat this with the other knee, and then both knees. This may be done with the other leg straight or bent, whichever feels better.  Sit-Ups or Curl-Ups. Bend your knees 90 degrees. Start with tilting your pelvis, and do a partial, slow sit-up, lifting your trunk only 30 to 45 degrees off the floor. Take at least 2 to 3 seconds for each sit-up. Do not do sit-ups with your knees out straight. If partial sit-ups are difficult, simply do the above but with only tightening your abdominal muscles and holding it as directed.  Hip-Lift. Lie on your back with your knees flexed 90 degrees. Push down with your feet and shoulders as you raise your hips a couple inches off the floor; hold for 10 seconds, repeat 5 to 10 times.  Back arches. Lie on your stomach, propping yourself up on bent elbows. Slowly press on your hands, causing an arch in your low back. Repeat 3 to 5 times. Any initial stiffness and discomfort should lessen with repetition over time.  Shoulder-Lifts. Lie face down with arms beside your body. Keep hips and torso pressed to floor as you slowly lift your head and shoulders off the floor. Do not overdo your exercises, especially in the beginning. Exercises may cause you some mild back discomfort which lasts for a few minutes; however, if the pain is more severe, or lasts for more than 15 minutes, do not continue exercises until you see your caregiver.  Improvement with exercise therapy for back problems is slow.  See your caregivers for assistance with developing a proper back exercise program. Document Released: 10/04/2004 Document Revised: 11/19/2011 Document Reviewed: 06/28/2011 [employer] Patient Information 2014 [employer].  

## 2013-10-26 NOTE — ED Notes (Signed)
Per EMS, pt complains of 9/10 lower back pain radiating bilaterally down his legs. Pt states he was putting down a box weighing approximately 100lb when he felt a pop in his back and began feeling the pain.

## 2013-10-26 NOTE — ED Notes (Addendum)
Charge discussed with pt that his narcotic medications cannot be called in. Pt unhappy he is being discharged home. Charge discussed discharge plan and recommended pt follow up with pcp to get home health if pt felt like he was unable to do for himself.

## 2013-12-23 ENCOUNTER — Emergency Department (HOSPITAL_COMMUNITY): Payer: Medicare Other

## 2013-12-23 ENCOUNTER — Encounter (HOSPITAL_COMMUNITY): Payer: Self-pay | Admitting: Emergency Medicine

## 2013-12-23 ENCOUNTER — Observation Stay (HOSPITAL_COMMUNITY)
Admission: EM | Admit: 2013-12-23 | Discharge: 2013-12-26 | Disposition: A | Discharge status: Home / Self Care | Payer: Medicare Other | Attending: Internal Medicine | Admitting: Internal Medicine

## 2013-12-23 DIAGNOSIS — R0602 Shortness of breath: Secondary | ICD-10-CM | POA: Diagnosis not present

## 2013-12-23 DIAGNOSIS — E1149 Type 2 diabetes mellitus with other diabetic neurological complication: Secondary | ICD-10-CM | POA: Insufficient documentation

## 2013-12-23 DIAGNOSIS — R42 Dizziness and giddiness: Secondary | ICD-10-CM

## 2013-12-23 DIAGNOSIS — Z794 Long term (current) use of insulin: Secondary | ICD-10-CM | POA: Diagnosis present

## 2013-12-23 DIAGNOSIS — R55 Syncope and collapse: Secondary | ICD-10-CM

## 2013-12-23 DIAGNOSIS — K5289 Other specified noninfective gastroenteritis and colitis: Secondary | ICD-10-CM | POA: Diagnosis not present

## 2013-12-23 DIAGNOSIS — K529 Noninfective gastroenteritis and colitis, unspecified: Secondary | ICD-10-CM

## 2013-12-23 DIAGNOSIS — R4182 Altered mental status, unspecified: Secondary | ICD-10-CM | POA: Diagnosis not present

## 2013-12-23 DIAGNOSIS — R29810 Facial weakness: Secondary | ICD-10-CM

## 2013-12-23 DIAGNOSIS — E1142 Type 2 diabetes mellitus with diabetic polyneuropathy: Secondary | ICD-10-CM | POA: Diagnosis not present

## 2013-12-23 DIAGNOSIS — I517 Cardiomegaly: Secondary | ICD-10-CM

## 2013-12-23 DIAGNOSIS — Z87448 Personal history of other diseases of urinary system: Secondary | ICD-10-CM | POA: Insufficient documentation

## 2013-12-23 DIAGNOSIS — K219 Gastro-esophageal reflux disease without esophagitis: Secondary | ICD-10-CM | POA: Diagnosis not present

## 2013-12-23 DIAGNOSIS — I2510 Atherosclerotic heart disease of native coronary artery without angina pectoris: Secondary | ICD-10-CM

## 2013-12-23 DIAGNOSIS — I10 Essential (primary) hypertension: Secondary | ICD-10-CM | POA: Diagnosis not present

## 2013-12-23 DIAGNOSIS — E785 Hyperlipidemia, unspecified: Secondary | ICD-10-CM | POA: Diagnosis not present

## 2013-12-23 DIAGNOSIS — R5381 Other malaise: Secondary | ICD-10-CM

## 2013-12-23 DIAGNOSIS — R1031 Right lower quadrant pain: Secondary | ICD-10-CM | POA: Diagnosis not present

## 2013-12-23 DIAGNOSIS — Z87891 Personal history of nicotine dependence: Secondary | ICD-10-CM | POA: Insufficient documentation

## 2013-12-23 DIAGNOSIS — Z7982 Long term (current) use of aspirin: Secondary | ICD-10-CM | POA: Diagnosis not present

## 2013-12-23 DIAGNOSIS — I639 Cerebral infarction, unspecified: Secondary | ICD-10-CM

## 2013-12-23 DIAGNOSIS — I6350 Cerebral infarction due to unspecified occlusion or stenosis of unspecified cerebral artery: Secondary | ICD-10-CM

## 2013-12-23 DIAGNOSIS — R269 Unspecified abnormalities of gait and mobility: Secondary | ICD-10-CM

## 2013-12-23 DIAGNOSIS — Z79899 Other long term (current) drug therapy: Secondary | ICD-10-CM | POA: Insufficient documentation

## 2013-12-23 DIAGNOSIS — E1140 Type 2 diabetes mellitus with diabetic neuropathy, unspecified: Secondary | ICD-10-CM | POA: Diagnosis present

## 2013-12-23 DIAGNOSIS — Z8673 Personal history of transient ischemic attack (TIA), and cerebral infarction without residual deficits: Secondary | ICD-10-CM | POA: Diagnosis not present

## 2013-12-23 DIAGNOSIS — Z978 Presence of other specified devices: Secondary | ICD-10-CM

## 2013-12-23 DIAGNOSIS — Z960 Presence of urogenital implants: Secondary | ICD-10-CM

## 2013-12-23 DIAGNOSIS — R404 Transient alteration of awareness: Secondary | ICD-10-CM | POA: Diagnosis not present

## 2013-12-23 DIAGNOSIS — R0789 Other chest pain: Secondary | ICD-10-CM | POA: Diagnosis not present

## 2013-12-23 DIAGNOSIS — Z9889 Other specified postprocedural states: Secondary | ICD-10-CM

## 2013-12-23 DIAGNOSIS — R930 Abnormal findings on diagnostic imaging of skull and head, not elsewhere classified: Secondary | ICD-10-CM | POA: Insufficient documentation

## 2013-12-23 DIAGNOSIS — M79609 Pain in unspecified limb: Secondary | ICD-10-CM | POA: Diagnosis not present

## 2013-12-23 DIAGNOSIS — E0800 Diabetes mellitus due to underlying condition with hyperosmolarity without nonketotic hyperglycemic-hyperosmolar coma (NKHHC): Secondary | ICD-10-CM | POA: Diagnosis present

## 2013-12-23 DIAGNOSIS — R5383 Other fatigue: Secondary | ICD-10-CM | POA: Diagnosis not present

## 2013-12-23 DIAGNOSIS — G4733 Obstructive sleep apnea (adult) (pediatric): Secondary | ICD-10-CM | POA: Diagnosis present

## 2013-12-23 DIAGNOSIS — R079 Chest pain, unspecified: Secondary | ICD-10-CM | POA: Diagnosis not present

## 2013-12-23 LAB — URINALYSIS, ROUTINE W REFLEX MICROSCOPIC
Bilirubin Urine: NEGATIVE
Glucose, UA: NEGATIVE mg/dL
KETONES UR: NEGATIVE mg/dL
NITRITE: NEGATIVE
PH: 7 (ref 5.0–8.0)
PROTEIN: NEGATIVE mg/dL
Specific Gravity, Urine: 1.004 — ABNORMAL LOW (ref 1.005–1.030)
Urobilinogen, UA: 0.2 mg/dL (ref 0.0–1.0)

## 2013-12-23 LAB — BASIC METABOLIC PANEL
BUN: 9 mg/dL (ref 6–23)
CO2: 26 mEq/L (ref 19–32)
Calcium: 8.7 mg/dL (ref 8.4–10.5)
Chloride: 94 mEq/L — ABNORMAL LOW (ref 96–112)
Creatinine, Ser: 0.63 mg/dL (ref 0.50–1.35)
GLUCOSE: 218 mg/dL — AB (ref 70–99)
POTASSIUM: 4.1 meq/L (ref 3.7–5.3)
SODIUM: 136 meq/L — AB (ref 137–147)

## 2013-12-23 LAB — URINE MICROSCOPIC-ADD ON

## 2013-12-23 LAB — CREATININE, SERUM
CREATININE: 0.6 mg/dL (ref 0.50–1.35)
GFR calc Af Amer: >90 mL/min (ref >90)

## 2013-12-23 LAB — PRO B NATRIURETIC PEPTIDE: PRO B NATRI PEPTIDE: 1247 pg/mL — AB (ref 0–125)

## 2013-12-23 LAB — HEMOGLOBIN A1C
HEMOGLOBIN A1C: 11.2 % — AB (ref <5.7)
Mean Plasma Glucose: 275 mg/dL — ABNORMAL HIGH (ref <117)

## 2013-12-23 LAB — CBC
HCT: 43.5 % (ref 39.0–52.0)
HCT: 44.9 % (ref 39.0–52.0)
HEMOGLOBIN: 15 g/dL (ref 13.0–17.0)
HEMOGLOBIN: 15.6 g/dL (ref 13.0–17.0)
MCH: 30.8 pg (ref 26.0–34.0)
MCH: 31.1 pg (ref 26.0–34.0)
MCHC: 34.5 g/dL (ref 30.0–36.0)
MCHC: 34.7 g/dL (ref 30.0–36.0)
MCV: 89.3 fL (ref 78.0–100.0)
MCV: 89.4 fL (ref 78.0–100.0)
PLATELETS: 208 10*3/uL (ref 150–400)
Platelets: 226 10*3/uL (ref 150–400)
RBC: 4.87 MIL/uL (ref 4.22–5.81)
RBC: 5.02 MIL/uL (ref 4.22–5.81)
RDW: 13 % (ref 11.5–15.5)
RDW: 13.1 % (ref 11.5–15.5)
WBC: 8.5 10*3/uL (ref 4.0–10.5)
WBC: 8.1 10*3/uL (ref 4.0–10.5)

## 2013-12-23 LAB — I-STAT TROPONIN, ED: Troponin i, poc: 0.01 ng/mL (ref 0.00–0.08)

## 2013-12-23 LAB — GLUCOSE, CAPILLARY
Glucose-Capillary: 301 mg/dL — ABNORMAL HIGH (ref 70–99)
Glucose-Capillary: 193 mg/dL — ABNORMAL HIGH (ref 70–99)

## 2013-12-23 LAB — TROPONIN I
Troponin I: <0.3 ng/mL (ref <0.30)
Troponin I: <0.3 ng/mL (ref <0.30)

## 2013-12-23 MED ORDER — DIPHENOXYLATE-ATROPINE 2.5-0.025 MG PO TABS
1.0000 | ORAL_TABLET | Freq: Four times a day (QID) | ORAL | Status: DC | PRN
  Administered 2013-12-26 (×2): 1 via ORAL
  Filled 2013-12-23 (×2): qty 1

## 2013-12-23 MED ORDER — SODIUM CHLORIDE 0.9 % IJ SOLN: 3.0000 mL | INTRAMUSCULAR | Status: DC | PRN

## 2013-12-23 MED ORDER — INSULIN ASPART 100 UNIT/ML ~~LOC~~ SOLN
10.0000 [IU] | Freq: Three times a day (TID) | SUBCUTANEOUS | Status: DC
  Administered 2013-12-23 – 2013-12-26 (×7): 10 [IU] via SUBCUTANEOUS

## 2013-12-23 MED ORDER — FUROSEMIDE 10 MG/ML IJ SOLN
40.0000 mg | Freq: Two times a day (BID) | INTRAMUSCULAR | Status: DC
  Administered 2013-12-23: 40 mg via INTRAVENOUS
  Filled 2013-12-23 (×3): qty 4

## 2013-12-23 MED ORDER — CYCLOBENZAPRINE HCL 10 MG PO TABS
10.0000 mg | ORAL_TABLET | Freq: Two times a day (BID) | ORAL | Status: DC | PRN
  Administered 2013-12-23 – 2013-12-24 (×3): 10 mg via ORAL
  Filled 2013-12-23 (×4): qty 1

## 2013-12-23 MED ORDER — INSULIN ASPART 100 UNIT/ML ~~LOC~~ SOLN
0.0000 [IU] | Freq: Three times a day (TID) | SUBCUTANEOUS | Status: DC
  Administered 2013-12-23: 15 [IU] via SUBCUTANEOUS
  Administered 2013-12-24: 7 [IU] via SUBCUTANEOUS
  Administered 2013-12-24: 11 [IU] via SUBCUTANEOUS
  Administered 2013-12-24: 7 [IU] via SUBCUTANEOUS
  Administered 2013-12-25: 4 [IU] via SUBCUTANEOUS
  Administered 2013-12-25: 11 [IU] via SUBCUTANEOUS
  Administered 2013-12-26: 7 [IU] via SUBCUTANEOUS
  Administered 2013-12-26: 4 [IU] via SUBCUTANEOUS

## 2013-12-23 MED ORDER — ONDANSETRON HCL 4 MG PO TABS: 4.0000 mg | ORAL_TABLET | Freq: Four times a day (QID) | ORAL | Status: DC | PRN

## 2013-12-23 MED ORDER — IBUPROFEN 800 MG PO TABS
800.0000 mg | ORAL_TABLET | Freq: Four times a day (QID) | ORAL | Status: DC | PRN
  Administered 2013-12-23: 800 mg via ORAL
  Filled 2013-12-23 (×2): qty 1

## 2013-12-23 MED ORDER — SODIUM CHLORIDE 0.9 % IV SOLN: 250.0000 mL | INTRAVENOUS | Status: DC | PRN

## 2013-12-23 MED ORDER — ASPIRIN EC 81 MG PO TBEC
81.0000 mg | DELAYED_RELEASE_TABLET | Freq: Every morning | ORAL | Status: DC
  Administered 2013-12-24 – 2013-12-26 (×3): 81 mg via ORAL
  Filled 2013-12-23 (×4): qty 1

## 2013-12-23 MED ORDER — SODIUM CHLORIDE 0.9 % IJ SOLN
3.0000 mL | Freq: Two times a day (BID) | INTRAMUSCULAR | Status: DC
  Administered 2013-12-23 – 2013-12-26 (×7): 3 mL via INTRAVENOUS

## 2013-12-23 MED ORDER — LOPERAMIDE HCL 2 MG PO TABS: 2.0000 mg | ORAL_TABLET | Freq: Four times a day (QID) | ORAL | Status: DC | PRN

## 2013-12-23 MED ORDER — SENNOSIDES-DOCUSATE SODIUM 8.6-50 MG PO TABS
1.0000 | ORAL_TABLET | Freq: Every evening | ORAL | Status: DC | PRN
  Filled 2013-12-23: qty 1

## 2013-12-23 MED ORDER — OXYBUTYNIN CHLORIDE 5 MG PO TABS
5.0000 mg | ORAL_TABLET | Freq: Three times a day (TID) | ORAL | Status: DC | PRN
  Administered 2013-12-25: 5 mg via ORAL
  Filled 2013-12-23 (×2): qty 1

## 2013-12-23 MED ORDER — ASPIRIN EC 81 MG PO TBEC: 81.0000 mg | DELAYED_RELEASE_TABLET | Freq: Every day | ORAL | Status: DC

## 2013-12-23 MED ORDER — ENOXAPARIN SODIUM 40 MG/0.4ML ~~LOC~~ SOLN
40.0000 mg | SUBCUTANEOUS | Status: DC
  Filled 2013-12-23 (×2): qty 0.4

## 2013-12-23 MED ORDER — INSULIN DETEMIR 100 UNIT/ML ~~LOC~~ SOLN
30.0000 [IU] | Freq: Every day | SUBCUTANEOUS | Status: DC
  Administered 2013-12-23 – 2013-12-24 (×2): 30 [IU] via SUBCUTANEOUS
  Filled 2013-12-23 (×2): qty 0.3

## 2013-12-23 MED ORDER — LISINOPRIL 10 MG PO TABS
10.0000 mg | ORAL_TABLET | Freq: Every day | ORAL | Status: DC
  Administered 2013-12-23: 10 mg via ORAL
  Filled 2013-12-23 (×2): qty 1

## 2013-12-23 MED ORDER — ACETAMINOPHEN 325 MG PO TABS: 650.0000 mg | ORAL_TABLET | Freq: Four times a day (QID) | ORAL | Status: DC | PRN

## 2013-12-23 MED ORDER — ONDANSETRON HCL 4 MG/2ML IJ SOLN
INTRAMUSCULAR | Status: AC
  Filled 2013-12-23: qty 2

## 2013-12-23 MED ORDER — ALUM & MAG HYDROXIDE-SIMETH 200-200-20 MG/5ML PO SUSP: 30.0000 mL | Freq: Four times a day (QID) | ORAL | Status: DC | PRN

## 2013-12-23 MED ORDER — SODIUM CHLORIDE 0.9 % IJ SOLN
3.0000 mL | Freq: Two times a day (BID) | INTRAMUSCULAR | Status: DC
  Administered 2013-12-23 (×2): 3 mL via INTRAVENOUS

## 2013-12-23 MED ORDER — ONDANSETRON HCL 4 MG/2ML IJ SOLN: 4.0000 mg | Freq: Once | INTRAMUSCULAR | Status: DC

## 2013-12-23 MED ORDER — DIPHENHYDRAMINE HCL 25 MG PO CAPS: 25.0000 mg | ORAL_CAPSULE | Freq: Four times a day (QID) | ORAL | Status: DC | PRN

## 2013-12-23 MED ORDER — NITROGLYCERIN 0.4 MG SL SUBL: 0.4000 mg | SUBLINGUAL_TABLET | SUBLINGUAL | Status: DC | PRN

## 2013-12-23 MED ORDER — LOPERAMIDE HCL 2 MG PO CAPS: 2.0000 mg | ORAL_CAPSULE | ORAL | Status: DC | PRN

## 2013-12-23 MED ORDER — PANTOPRAZOLE SODIUM 40 MG PO TBEC
40.0000 mg | DELAYED_RELEASE_TABLET | Freq: Every day | ORAL | Status: DC
  Administered 2013-12-24 – 2013-12-26 (×3): 40 mg via ORAL
  Filled 2013-12-23 (×3): qty 1

## 2013-12-23 MED ORDER — METOPROLOL TARTRATE 100 MG PO TABS
100.0000 mg | ORAL_TABLET | Freq: Every day | ORAL | Status: DC
  Administered 2013-12-23: 100 mg via ORAL
  Filled 2013-12-23 (×2): qty 1

## 2013-12-23 MED ORDER — ACETAMINOPHEN 650 MG RE SUPP: 650.0000 mg | Freq: Four times a day (QID) | RECTAL | Status: DC | PRN

## 2013-12-23 MED ORDER — ONDANSETRON HCL 4 MG/2ML IJ SOLN: 4.0000 mg | Freq: Four times a day (QID) | INTRAMUSCULAR | Status: DC | PRN

## 2013-12-23 NOTE — ED Notes (Signed)
Neurology at bedside.

## 2013-12-23 NOTE — ED Notes (Signed)
Neuro assessing patient.

## 2013-12-23 NOTE — ED Notes (Signed)
Patient transported to CT 

## 2013-12-23 NOTE — ED Notes (Signed)
MD at bedside. 

## 2013-12-23 NOTE — ED Notes (Signed)
Dr. Stark Jock to bedside to evaluate patient. Made aware of left side mouth is taunt which pt reports is new.

## 2013-12-23 NOTE — Consult Note (Signed)
NEURO HOSPITALIST CONSULT NOTE    Reason for Consult: left facial droop  HPI:                                                                                                                                          [name] is an 68 y.o. male went to his primary care provider today for routine appointment. While in waiting room there is question of unresponsiveness versus lethargy.  Upon EMS arrival he was complaining of CP. HE was given ASA and 2 SL nitro. Patient was brought to ED.  While in ED he has remained alert and oriented.  On exam there was question of left facial droop which patient does not feel is present.  Currently patient has no complaints other than right arm decreased sensation but he states this is baseline for him. He admits to a old CVA but cannot give me any history on this.    Past Medical History  Diagnosis Date  . Obese   . Hypertension   . Diabetes mellitus   . Diabetic diarrhea   . Sleep apnea   . Diabetic neuropathy, painful   . Chronic indwelling Foley catheter   . Bladder spasms   . Complication of anesthesia   . PONV (postoperative nausea and vomiting)   . Hyperlipidemia   . GERD (gastroesophageal reflux disease)   . Neurogenic bladder   . Stroke 2013    Past Surgical History  Procedure Laterality Date  . Right tkr    . Gastroplasty    . Cholecystectomy    . Total knee arthroplasty      Family History  Problem Relation Age of Onset  . Hypertension Mother   . Diabetes type I Father   . Cancer Brother      Testicular Cancer  . Cancer Brother     Leukemia     Social History:  reports that he quit smoking about 37 years ago. He has never used smokeless tobacco. He reports that he does not drink alcohol or use illicit drugs.  Allergies  Allergen Reactions  . Ace Inhibitors Swelling  . Cefadroxil Hives  . Cephalexin Hives  . Lipitor [Atorvastatin Calcium] Swelling  . Metformin And Related Swelling  .  Methocarbamol Other (See Comments)    Feels like is in "rocky boat"  . Morphine And Related Other (See Comments)    Sweating, feels like is "in rocky boat."  . Robaxin [Methocarbamol]     Feels like he is shaky    MEDICATIONS:  Current Facility-Administered Medications  Medication Dose Route Frequency Provider Last Rate Last Dose  . ondansetron (ZOFRAN) injection 4 mg  4 mg Intravenous Once Veryl Speak, MD       Current Outpatient Prescriptions  Medication Sig Dispense Refill  . aspirin EC 81 MG tablet Take 81 mg by mouth every morning.       . cyclobenzaprine (FLEXERIL) 10 MG tablet Take 1 tablet (10 mg total) by mouth 2 (two) times daily as needed for muscle spasms.  20 tablet  0  . diphenhydrAMINE (BENADRYL) 25 mg capsule Take 25 mg by mouth every 6 (six) hours as needed. For allergy.      . diphenoxylate-atropine (LOMOTIL) 2.5-0.025 MG per tablet Take 1 tablet by mouth 4 (four) times daily as needed. Diarrhea      . furosemide (LASIX) 40 MG tablet Take 40 mg by mouth daily.      Marland Kitchen ibuprofen (ADVIL,MOTRIN) 200 MG tablet Take 800 mg by mouth every 6 (six) hours as needed for mild pain or moderate pain.      Marland Kitchen insulin NPH-insulin regular (NOVOLIN 70/30) (70-30) 100 UNIT/ML injection Inject 50-60 Units into the skin 2 (two) times daily with a meal.       . lisinopril (PRINIVIL,ZESTRIL) 10 MG tablet Take 10 mg by mouth daily.      Marland Kitchen loperamide (IMODIUM A-D) 2 MG tablet Take 2 mg by mouth 4 (four) times daily as needed for diarrhea or loose stools.      . metoprolol (LOPRESSOR) 50 MG tablet Take 100 mg by mouth daily.      . nitroGLYCERIN (NITROSTAT) 0.4 MG SL tablet Place 0.4 mg under the tongue every 5 (five) minutes as needed for chest pain.      Marland Kitchen omeprazole (PRILOSEC) 20 MG capsule Take 20 mg by mouth daily.      Marland Kitchen oxybutynin (DITROPAN) 5 MG tablet Take 5 mg by mouth 3  (three) times daily as needed for bladder spasms.          ROS:                                                                                                                                       History obtained from the patient  General ROS: negative for - chills, fatigue, fever, night sweats, weight gain or weight loss Psychological ROS: negative for - behavioral disorder, hallucinations, memory difficulties, mood swings or suicidal ideation Ophthalmic ROS: negative for - blurry vision, double vision, eye pain or loss of vision ENT ROS: negative for - epistaxis, nasal discharge, oral lesions, sore throat, tinnitus or vertigo Allergy and Immunology ROS: negative for - hives or itchy/watery eyes Hematological and Lymphatic ROS: negative for - bleeding problems, bruising or swollen lymph nodes Endocrine ROS: negative for - galactorrhea, hair pattern changes, polydipsia/polyuria or temperature intolerance Respiratory ROS: negative for - cough, hemoptysis, shortness of breath or wheezing  Cardiovascular ROS: negative for - chest pain, dyspnea on exertion, edema or irregular heartbeat Gastrointestinal ROS: negative for - abdominal pain, diarrhea, hematemesis, nausea/vomiting or stool incontinence Genito-Urinary ROS: negative for - dysuria, hematuria, incontinence or urinary frequency/urgency Musculoskeletal ROS: negative for - joint swelling or muscular weakness Neurological ROS: as noted in HPI Dermatological ROS: negative for rash and skin lesion changes   Blood pressure 134/72, pulse 89, temperature 98 F (36.7 C), temperature source Oral, resp. rate 19, SpO2 96.00%.   Neurologic Examination:                                                                                                      Mental Status: Alert, oriented, thought content appropriate.  Speech fluent without evidence of aphasia.  Able to follow 3 step commands without difficulty. Cranial Nerves: II: Discs flat  bilaterally; Visual fields grossly normal, pupils equal, round, reactive to light and accommodation III,IV, VI: ptosis not present, extra-ocular motions intact bilaterally V,VII: smile symmetric (will draw his right face down but will not fill cheeks with air--while nurse was in room she made a joke and he smiled with full equal smile), facial light touch sensation stated to be decreased on the left splitting midline.  VIII: hearing normal bilaterally IX,X: gag reflex present XI: bilateral shoulder shrug XII: midline tongue extension without atrophy or fasciculations  Motor: Right : Upper extremity   5/5    Left:     Upper extremity   5/5  Lower extremity   5/5     Lower extremity   5/5 Tone and bulk:normal tone throughout; no atrophy noted Sensory: Pinprick and light touch intact throughout, bilaterally Deep Tendon Reflexes:  Right: Upper Extremity   Left: Upper extremity   biceps (C-5 to C-6) 2/4   biceps (C-5 to C-6) 2/4 tricep (C7) 2/4    triceps (C7) 2/4 Brachioradialis (C6) 2/4  Brachioradialis (C6) 2/4  Lower Extremity Lower Extremity  quadriceps (L-2 to L-4) 1/4   quadriceps (L-2 to L-4) 1/4 Achilles (S1) 0/4   Achilles (S1) 0/4  Plantars: Mute bilaterally Cerebellar: normal finger-to-nose,  Unable to take part in heel-to-shin test due to body habitus Gait: not tested CV: pulses palpable throughout    Lab Results: Basic Metabolic Panel:  Recent Labs Lab 12/23/13 0936  NA 136*  K 4.1  CL 94*  CO2 26  GLUCOSE 218*  BUN 9  CREATININE 0.63  CALCIUM 8.7    Liver Function Tests: No results found for this basename: AST, ALT, ALKPHOS, BILITOT, PROT, ALBUMIN,  in the last 168 hours No results found for this basename: LIPASE, AMYLASE,  in the last 168 hours No results found for this basename: AMMONIA,  in the last 168 hours  CBC:  Recent Labs Lab 12/23/13 0936  WBC 8.5  HGB 15.0  HCT 43.5  MCV 89.3  PLT 226    Cardiac Enzymes: No results found for this  basename: CKTOTAL, CKMB, CKMBINDEX, TROPONINI,  in the last 168 hours  Lipid Panel: No results found for this basename: CHOL, TRIG, HDL, CHOLHDL, VLDL, LDLCALC,  in the last 168 hours  CBG: No results found for this basename: GLUCAP,  in the last 168 hours  Microbiology: Results for orders placed during the hospital encounter of 07/26/12  URINE CULTURE     Status: None   Collection Time    07/26/12  9:35 AM      Result Value Ref Range Status   Specimen Description URINE, CATHETERIZED   Final   Special Requests NONE   Final   Culture  Setup Time 07/26/2012 15:05   Final   Colony Count >=100,000 COLONIES/ML   Final   Culture     Final   Value: Multiple bacterial morphotypes present, none predominant. Suggest appropriate recollection if clinically indicated.   Report Status 07/27/2012 FINAL   Final    Coagulation Studies: No results found for this basename: LABPROT, INR,  in the last 72 hours  Imaging: Ct Head Wo Contrast  12/23/2013   CLINICAL DATA:  Altered level of consciousness, substernal chest pain radiating to left arm, hypertension, diabetes, prior stroke  EXAM: CT HEAD WITHOUT CONTRAST  TECHNIQUE: Contiguous axial images were obtained from the base of the skull through the vertex without intravenous contrast.  COMPARISON:  10/25/2012  FINDINGS: Generalized atrophy.  Normal ventricular morphology.  No midline shift or mass effect.  Stable benign-appearing basal ganglia and thalamic calcifications.  No intracranial hemorrhage, mass lesion, or evidence acute infarction.  No extra-axial fluid collections.  Bones and sinuses unremarkable.  IMPRESSION: No acute intracranial abnormalities.  No significant interval change.   Electronically Signed   By: Lavonia Dana M.D.   On: 12/23/2013 10:12   Dg Chest Port 1 View  12/23/2013   CLINICAL DATA:  Chest pain and dyspnea  EXAM: PORTABLE CHEST - 1 VIEW  COMPARISON:  DG CHEST 2 VIEW dated 10/23/2012  FINDINGS: The lungs are well-expanded. The  interstitial markings are mildly increased but this is not clearly new. The cardiopericardial silhouette is mildly enlarged. The central pulmonary vascularity is prominent. There is mild tortuosity of the descending thoracic aorta. The mediastinum is normal in width. There is no pleural effusion or pneumothorax.  IMPRESSION: There is no evidence of pneumonia. There remains enlargement of the cardiac silhouette and prominence of the central pulmonary vascularity. This coupled the mildly increased interstitial markings likely reflects low grade CHF.   Electronically Signed   By: David  Martinique   On: 12/23/2013 09:26       Assessment and plan per attending neurologist  Etta Quill PA-C [facility] [phone]  12/23/2013, 1:14 PM  Stroke risk factors: DM, HTN, Hyperlipidemia  Assessment/Plan:  68 YO male with transient period of lethargy and left facial asymmetry while in ED. On exam patient is pulling his right side of his face and able to smile symmetrical when distracted. On sensory exam he splits midline at nose and chin including to vibartion. He has multiple findings on exam supportive of a non-physiological etiology to his symptoms, but would rule out embellishment on top of true symptoms with MRI.   1) MRI brain 2) If negative, no further workup is required.   Roland Rack, MD [facility] [phone]  If 7pm- 7am, please page neurology on call as listed in [place].

## 2013-12-23 NOTE — Progress Notes (Signed)
  Echocardiogram 2D Echocardiogram has been performed.  [name] 12/23/2013, 4:43 PM

## 2013-12-23 NOTE — ED Provider Notes (Signed)
CSN: [id number]     Arrival date & time 12/23/13  [id number] History   First MD Initiated Contact with Patient 12/23/13 0900     Chief Complaint  Patient presents with  . Chest Pain     (Consider location/radiation/quality/duration/timing/severity/associated sxs/prior Treatment) HPI Comments: Patient is a 68 year old male with extensive past medical history including morbid obesity, diabetes, coronary artery disease, diabetic neuropathy. He presents today with complaints of chest tightness following an episode of unresponsiveness that occurred at his primary care office. He had a routine appointment this morning and woke up feeling well. He had to walk some distance to get to the doctor's office stated he had some difficulty getting there because he was becoming short of breath. He was in the waiting room and when his name was called he was unresponsive. It took several office workers to wake him. He was brought here by EMS with complaints of some tightness in his chest and left arm pain. He was given 4 baby aspirin and tells me that his pain is now gone. He does feel somewhat short of breath.  Patient is a 68 y.o. male presenting with chest pain. The history is provided by the patient.  Chest Pain Pain location:  Substernal area Pain quality: tightness   Pain radiates to:  L arm Pain radiates to the back: no   Pain severity:  Moderate Onset quality:  Sudden Duration:  1 hour Timing:  Constant Progression:  Worsening Chronicity:  New Relieved by:  Nothing Worsened by:  Nothing tried Ineffective treatments:  None tried   Past Medical History  Diagnosis Date  . Obese   . Hypertension   . Diabetes mellitus   . Diabetic diarrhea   . Sleep apnea   . Diabetic neuropathy, painful   . Chronic indwelling Foley catheter   . Bladder spasms   . Complication of anesthesia   . PONV (postoperative nausea and vomiting)   . Hyperlipidemia   . GERD (gastroesophageal reflux disease)   . Neurogenic  bladder   . Stroke 2013   Past Surgical History  Procedure Laterality Date  . Right tkr    . Gastroplasty    . Cholecystectomy    . Total knee arthroplasty     Family History  Problem Relation Age of Onset  . Hypertension Mother   . Diabetes type I Father   . Cancer Brother      Testicular Cancer  . Cancer Brother     Leukemia   History  Substance Use Topics  . Smoking status: Former Smoker -- 2.00 packs/day for 20 years    Quit date: 09/10/1976  . Smokeless tobacco: Never Used  . Alcohol Use: No     Comment: FORMER ALCOHOLIC WITH 23 YEAR SOBRIETY    Review of Systems  Cardiovascular: Positive for chest pain.  All other systems reviewed and are negative.     Allergies  Ace inhibitors; Cefadroxil; Cephalexin; Lipitor; Metformin and related; Methocarbamol; Morphine and related; and Robaxin  Home Medications   Prior to Admission medications   Medication Sig Start Date End Date Taking? Authorizing Provider  aspirin EC 81 MG tablet Take 81 mg by mouth every morning.     Historical Provider, MD  cyclobenzaprine (FLEXERIL) 10 MG tablet Take 1 tablet (10 mg total) by mouth 2 (two) times daily as needed for muscle spasms. 10/26/13   Hoy Morn, MD  diphenhydrAMINE (BENADRYL) 25 mg capsule Take 25 mg by mouth every 6 (six) hours as needed.  For allergy.    Historical Provider, MD  diphenoxylate-atropine (LOMOTIL) 2.5-0.025 MG per tablet Take 1 tablet by mouth 4 (four) times daily as needed. Diarrhea    Historical Provider, MD  furosemide (LASIX) 40 MG tablet Take 40 mg by mouth daily.    Historical Provider, MD  HYDROcodone-acetaminophen (NORCO/VICODIN) 5-325 MG per tablet Take 1 tablet by mouth every 4 (four) hours as needed for moderate pain. 10/26/13   Hoy Morn, MD  ibuprofen (ADVIL,MOTRIN) 200 MG tablet Take 800 mg by mouth every 6 (six) hours as needed for mild pain or moderate pain.    Historical Provider, MD  insulin NPH-insulin regular (NOVOLIN 70/30) (70-30) 100  UNIT/ML injection Inject 60 Units into the skin 2 (two) times daily with a meal.     Historical Provider, MD  lisinopril (PRINIVIL,ZESTRIL) 10 MG tablet Take 10 mg by mouth daily.    Historical Provider, MD  loperamide (IMODIUM A-D) 2 MG tablet Take 2 mg by mouth 4 (four) times daily as needed for diarrhea or loose stools.    Historical Provider, MD  metoprolol (LOPRESSOR) 50 MG tablet Take 100 mg by mouth daily.    Historical Provider, MD  nitroGLYCERIN (NITROSTAT) 0.4 MG SL tablet Place 0.4 mg under the tongue every 5 (five) minutes as needed for chest pain.    Historical Provider, MD  omeprazole (PRILOSEC) 20 MG capsule Take 20 mg by mouth daily.    Historical Provider, MD   BP 109/58  Pulse 108  Temp(Src) 98 F (36.7 C) (Oral)  Resp 18  SpO2 96% Physical Exam  Nursing note and vitals reviewed. Constitutional: He is oriented to person, place, and time. He appears well-developed and well-nourished. No distress.  HENT:  Head: Normocephalic and atraumatic.  Neck: Normal range of motion. Neck supple.  Cardiovascular: Normal rate, regular rhythm and normal heart sounds.   No murmur heard. Pulmonary/Chest: Effort normal and breath sounds normal. No respiratory distress. He has no wheezes.  Abdominal: Soft. Bowel sounds are normal. He exhibits no distension. There is no tenderness.  Abdomen is obese. There is no rebound and no guarding.  Musculoskeletal: Normal range of motion. He exhibits no edema.  Lymphadenopathy:    He has no cervical adenopathy.  Neurological: He is alert and oriented to person, place, and time. No cranial nerve deficit. He exhibits normal muscle tone. Coordination normal.  Skin: Skin is warm and dry. He is not diaphoretic.    ED Course  Procedures (including critical care time) Labs Review Labs Reviewed  [place], ED    Imaging Review No results found.   EKG Interpretation   Date/Time:  Wednesday December 23 2013  09:04:46 EDT Ventricular Rate:  104 PR Interval:  212 QRS Duration: 119 QT Interval:  348 QTC Calculation: 458 R Axis:   -57 Text Interpretation:  Sinus tachycardia Borderline prolonged PR interval  Probable left atrial enlargement Incomplete left bundle branch block Left  ventricular hypertrophy Anterior Q waves, possibly due to LVH ST  elevation, consider inferior injury      MDM   Final diagnoses:  None    Patient is a 68 year old male with extensive past medical history. Presents today after becoming dyspneic and tight in the chest while walking into his doctor's appointment. He sat down in the waiting room and when his name was called was apparently unresponsive. He woke up a short time later and was transported here by EMS for further evaluation. He was  neurologically intact upon arrival and complaining only of discomfort in his left arm. Workup was initiated including CT of the head which was unremarkable, as was the remainder of the workup. There is no evidence thus far of an acute coronary event. Do to his obesity and multiple risk factors, I feel as though admission to the hospital is indicated. I've spoken with Dr. Hartford Poli who agrees to admit.  I returned the patient's room to inform him of the plan and he informed me he was having left facial numbness and difficulty processing words. She was reexamined and I found no strength deficits in the arms or legs, however he does seem to have a slight facial droop. His speech is clear. I discussed this finding with Dr. Leonel Ramsay. He requested I check his visual fields. This was done and I found no obvious visual field deficits.  He will be admitted to the internal medicine service for observation and further workup.  Nelida Meuse, MD 12/23/13 1228

## 2013-12-23 NOTE — ED Notes (Signed)
Per EMS- Pt coming from [facility], was there for routine appointment. When called pt from waiting room was unresponsive. C/o substernal CP. Was given 324 aspirin, 2 SL nitro. EKG depression in lateral leads. CBG 243. Is lethargic with EMS. Pt reports he hasn't slept in a few days. Also has foley that needs to be changed. Currently reports central chest pressure, radiation to left arm. Reports pain like this when he had TIA. BP 150/90, HR 110.

## 2013-12-23 NOTE — H&P (Signed)
[facility]                                                                                    Patient Demographics  [name], is a 68 y.o. male  MRN: [id number]   DOB - Mar 04, 1946  Admit Date - 12/23/2013  Outpatient Primary MD for the patient is Brian Shelling, MD   With History of -  Past Medical History  Diagnosis Date  . Obese   . Hypertension   . Diabetes mellitus   . Diabetic diarrhea   . Sleep apnea   . Diabetic neuropathy, painful   . Chronic indwelling Foley catheter   . Bladder spasms   . Complication of anesthesia   . PONV (postoperative nausea and vomiting)   . Hyperlipidemia   . GERD (gastroesophageal reflux disease)   . Neurogenic bladder   . Stroke 2013      Past Surgical History  Procedure Laterality Date  . Right tkr    . Gastroplasty    . Cholecystectomy    . Total knee arthroplasty      in for   Chief Complaint  Patient presents with  . Chest Pain     HPI  [name]  is a 68 y.o. male, who presented to the emergency room with a syncopal episode.  [name] was visting his PCP for a regularly scheduled appointment this morning.  He was feeling well this morning but became SOB while walking down the hall to the office.  The next thing he remembers is being awoken by 4 or 5 people and was sitting in the waiting room chair.  He denies any dizziness during this episode.  He also denies any head trama. He also mentions substernal chest pain and chest tightness that radiated to his left arm was present after the syncopal episode and present in the emergency room.  He was given 4 baby asprin at his PCP office.    He has a past medical history notable for a CVA in 2013, Uncontrolled DM, HTN, Chronic indwelling foley catheter, morbid obesity, and Hyperlipidemia.    Review of Systems    Review of Systems  Constitutional: Positive for malaise/fatigue. Negative for fever, chills, weight loss and diaphoresis.  HENT: Negative for  congestion, ear pain, hearing loss and nosebleeds.   Eyes: Negative for blurred vision, double vision and photophobia.  Respiratory: Negative for cough, sputum production and wheezing.   Cardiovascular: Positive for chest pain. Negative for palpitations.  Gastrointestinal: Negative.  Negative for heartburn, nausea, vomiting, abdominal pain and diarrhea.  Musculoskeletal: Negative for back pain, joint pain, myalgias and neck pain.  Skin: Positive for itching (in the groin area).  Neurological: Positive for weakness. Negative for tingling, tremors and speech change.  Psychiatric/Behavioral: Positive for memory loss ("takes him a while to rem recent events"). The patient is not nervous/anxious.    A full 10 point Review of Systems was done, except as stated above, all other Review of Systems were negative.   Social History History  Substance Use Topics  . Smoking status: Former Smoker -- 2.00 packs/day for 20 years    Quit date: 09/10/1976  .  Smokeless tobacco: Never Used  . Alcohol Use: No     Comment: FORMER ALCOHOLIC WITH 23 YEAR SOBRIETY     Family History Family History  Problem Relation Age of Onset  . Hypertension Mother   . Diabetes type I Father   . Cancer Brother      Testicular Cancer  . Cancer Brother     Leukemia  Mother died at 58 with cerebral hemorrhage Father died at 31 with MI, he has a hx of DM.   Prior to Admission medications   Medication Sig Start Date End Date Taking? Authorizing Provider  aspirin EC 81 MG tablet Take 81 mg by mouth every morning.    Yes Historical Provider, MD  cyclobenzaprine (FLEXERIL) 10 MG tablet Take 1 tablet (10 mg total) by mouth 2 (two) times daily as needed for muscle spasms. 10/26/13  Yes Hoy Morn, MD  diphenhydrAMINE (BENADRYL) 25 mg capsule Take 25 mg by mouth every 6 (six) hours as needed. For allergy.   Yes Historical Provider, MD  diphenoxylate-atropine (LOMOTIL) 2.5-0.025 MG per tablet Take 1 tablet by mouth 4 (four)  times daily as needed. Diarrhea   Yes Historical Provider, MD  furosemide (LASIX) 40 MG tablet Take 40 mg by mouth daily.   Yes Historical Provider, MD  ibuprofen (ADVIL,MOTRIN) 200 MG tablet Take 800 mg by mouth every 6 (six) hours as needed for mild pain or moderate pain.   Yes Historical Provider, MD  insulin NPH-insulin regular (NOVOLIN 70/30) (70-30) 100 UNIT/ML injection Inject 50-60 Units into the skin 2 (two) times daily with a meal.    Yes Historical Provider, MD  lisinopril (PRINIVIL,ZESTRIL) 10 MG tablet Take 10 mg by mouth daily.   Yes Historical Provider, MD  loperamide (IMODIUM A-D) 2 MG tablet Take 2 mg by mouth 4 (four) times daily as needed for diarrhea or loose stools.   Yes Historical Provider, MD  metoprolol (LOPRESSOR) 50 MG tablet Take 100 mg by mouth daily.   Yes Historical Provider, MD  nitroGLYCERIN (NITROSTAT) 0.4 MG SL tablet Place 0.4 mg under the tongue every 5 (five) minutes as needed for chest pain.   Yes Historical Provider, MD  omeprazole (PRILOSEC) 20 MG capsule Take 20 mg by mouth daily.   Yes Historical Provider, MD  oxybutynin (DITROPAN) 5 MG tablet Take 5 mg by mouth 3 (three) times daily as needed for bladder spasms.   Yes Historical Provider, MD    Allergies  Allergen Reactions  . Ace Inhibitors Swelling  . Cefadroxil Hives  . Cephalexin Hives  . Lipitor [Atorvastatin Calcium] Swelling  . Metformin And Related Swelling  . Methocarbamol Other (See Comments)    Feels like is in "rocky boat"  . Morphine And Related Other (See Comments)    Sweating, feels like is "in rocky boat."  . Robaxin [Methocarbamol]     Feels like he is shaky    Physical Exam  Vitals  Blood pressure 139/90, pulse 93, temperature 98 F (36.7 C), temperature source Oral, resp. rate 19, height 6\' 1"  (1.854 m), weight 170.779 kg (376 lb 8 oz), SpO2 97.00%.   Physical Exam  Nursing note and vitals reviewed. Constitutional: He is oriented to person, place, and time. Vital  signs are normal. He appears well-developed. He is active. No distress.  Morbidly obese     HENT:  Head: Atraumatic.  Eyes: EOM are normal. Pupils are equal, round, and reactive to light. No scleral icterus.  Neck: No JVD present.  Cardiovascular:  Normal rate, regular rhythm, normal heart sounds and intact distal pulses.   Respiratory: Effort normal and breath sounds normal. No stridor. No respiratory distress. He exhibits tenderness (reproducible).  GI: Bowel sounds are normal. He exhibits no distension. There is no tenderness. There is no rebound and no guarding.  Genitourinary:  Foley Catheter in place   Musculoskeletal:   R and L lower extremity and  ankle,  Edema, erythema, scaly,    Neurological: He is alert and oriented to person, place, and time.  Right arm weakness noted. Abnormal smile,  Left sided droop  Skin: Skin is warm and dry. He is not diaphoretic.  Psychiatric: Judgment and thought content normal. His speech is delayed. He is slowed and withdrawn. He exhibits a depressed mood. He exhibits abnormal recent memory.    Data Review  CBC  Recent Labs Lab 12/23/13 0936  WBC 8.5  HGB 15.0  HCT 43.5  PLT 226  MCV 89.3  MCH 30.8  MCHC 34.5  RDW 13.0   ------------------------------------------------------------------------------------------------------------------  Chemistries   Recent Labs Lab 12/23/13 0936  NA 136*  K 4.1  CL 94*  CO2 26  GLUCOSE 218*  BUN 9  CREATININE 0.63  CALCIUM 8.7     ---------------------------------------------------------------------------------------------------------------  Urinalysis    Component Value Date/Time   COLORURINE YELLOW 10/24/2012 0545   APPEARANCEUR CLOUDY* 10/24/2012 0545   LABSPEC 1.003* 10/24/2012 0545   PHURINE 7.0 10/24/2012 0545   GLUCOSEU 100* 10/24/2012 0545   HGBUR LARGE* 10/24/2012 0545   BILIRUBINUR NEGATIVE 10/24/2012 0545   KETONESUR NEGATIVE 10/24/2012 0545   PROTEINUR NEGATIVE 10/24/2012  0545   UROBILINOGEN 0.2 10/24/2012 0545   NITRITE NEGATIVE 10/24/2012 0545   LEUKOCYTESUR LARGE* 10/24/2012 0545    ----------------------------------------------------------------------------------------------------------------  Imaging results:   Ct Head Wo Contrast  12/23/2013   CLINICAL DATA:  Altered level of consciousness, substernal chest pain radiating to left arm, hypertension, diabetes, prior stroke  EXAM: CT HEAD WITHOUT CONTRAST  TECHNIQUE: Contiguous axial images were obtained from the base of the skull through the vertex without intravenous contrast.  COMPARISON:  10/25/2012  FINDINGS: Generalized atrophy.  Normal ventricular morphology.  No midline shift or mass effect.  Stable benign-appearing basal ganglia and thalamic calcifications.  No intracranial hemorrhage, mass lesion, or evidence acute infarction.  No extra-axial fluid collections.  Bones and sinuses unremarkable.  IMPRESSION: No acute intracranial abnormalities.  No significant interval change.   Electronically Signed   By: Lavonia Dana M.D.   On: 12/23/2013 10:12   Dg Chest Port 1 View  12/23/2013   CLINICAL DATA:  Chest pain and dyspnea  EXAM: PORTABLE CHEST - 1 VIEW  COMPARISON:  DG CHEST 2 VIEW dated 10/23/2012  FINDINGS: The lungs are well-expanded. The interstitial markings are mildly increased but this is not clearly new. The cardiopericardial silhouette is mildly enlarged. The central pulmonary vascularity is prominent. There is mild tortuosity of the descending thoracic aorta. The mediastinum is normal in width. There is no pleural effusion or pneumothorax.  IMPRESSION: There is no evidence of pneumonia. There remains enlargement of the cardiac silhouette and prominence of the central pulmonary vascularity. This coupled the mildly increased interstitial markings likely reflects low grade CHF.   Electronically Signed   By: David  Martinique   On: 12/23/2013 09:26    My personal review of EKG: Rhythm NSR, Rate  104/min, QTc  458 , no Acute ST changes    Assessment & Plan  Principal Problem:   Syncope and collapse Active  Problems:   DM (diabetes mellitus) with complications   OSA (obstructive sleep apnea)   Diabetic neuropathy, painful   Hypertension   CAD (coronary artery disease)   Mental status change   Chest pain   Chronic indwelling Foley catheter     Syncopal episode Uncertain etiology.  Ruling out cardiac origin and stroke.  Evaluate for UTI  Will check 2D echo CT head was negative Neurology was consulted and recommends MR Brain to rule out stroke. U/A pending.  CHF - Hx of diastolic dysfunction Patient has shortness of breath on exertion along with lower extremity swelling.  Ordered a LE duplux for possible DVT/evaluate for PE Slight fluid overload was noted on the chest X-ray along with mildy increased interstitial markings.  BNP on 12/23/2013 was 1247.  Started the patient on lasix and continued metoprolol.  Ace/ARB avoided due to allergies.  Ordered a  2d echocardiogram to assess cardiac function.  Ordered orthostatic checks throughout hospital stay to assess  Ortho Hypotension.  Patient is on Saline lock.    Rule out MI  Will continue to cycle troponin markers but ECG showed not acute abnormalities or changes from previous ecgs.,  Patient reports arm pain no longer present in ER,  Repeat ECG tomorrow am. -continue to monitor for signs of acute MI.  Patient will be put on telemetry and continuous pulse ox monitoring  Rule out Stroke CT of the head shows no acute abnormalities but physical exam noted some concerns such as uneven smile and right sided weakness.  Patient could not state if this was his baseline from his previous CVA,  Neurology was consulted and recommended an MRI of the brain.    UTI? Due to chronic foley catheter in place and complaint of pruritis  Ordered UA with reflex and urine culture however patient was afebrile, normotensive, and no increase in WBC.  Uncontrolled  DM.   Patient originally on 70/30 insulin at home.   Switching patient to long acting and bolus insulin while inpatient.  Hoping to admit this patient to a SNF after inpatient for furthur management.  Ordered Hem. A1c Ordered a consult with a dietician for uncontrolled DM.  Hx of Perpherial neuropathy -Due to chronic uncontrolled diabetes.    Obstructive sleep apnea -Ordered CPAP machine prn.  Chronic Diarrhea- Patient on lomotil prn-   Generalized headaches - acetaminophen 650mg  prn   DVT Prophylaxis Heparin -  Lovenox  AM Labs Ordered, also please review Full Orders  Family Communication:      [name] will be picking this patient up at 7-am on 12/24/2013.  Code Status:  DNR  Likely DC to:    SNF in 2-3 days  Condition:  stable  Time spent in minutes : [place], Student-PA [place], [place]  on 12/23/2013 at 3:00 PM Between 7am to 7pm - Pager - [phone] After 7pm go to [email or web address] - password [email or web address] And look for the night coverage person covering me after hours  [place]  [phone]   Addendum  Patient seen and examined, chart and data base reviewed.  I agree with the above assessment and plan.  For full details please see Mrs. Imogene Burn PA note.   Birdie Hopes, MD [facility] Pager: [phone] 12/23/2013, 6:12 PM

## 2013-12-23 NOTE — Progress Notes (Signed)
Inpatient Diabetes Program Recommendations  AACE/ADA: New Consensus Statement on Inpatient Glycemic Control (2013)  Target Ranges:  Prepandial:   less than 140 mg/dL      Peak postprandial:   less than 180 mg/dL (1-2 hours)      Critically ill patients:  140 - 180 mg/dL   Reason for Visit: Hyperglycemia  Diabetes history: DM2 Outpatient Diabetes medications: 70/30 50 units in am and 60 units pm Current orders for Inpatient glycemic control: Levemir 30 QHS, Novolog 10 units tidwc and resistant tidwc  Results for [name], [name] (MRN [id number]) as of 12/23/2013 14:33  Ref. Range 12/23/2013 09:36  Sodium Latest Range: 137-147 mEq/L 136 (L)  Potassium Latest Range: 3.7-5.3 mEq/L 4.1  Chloride Latest Range: 96-112 mEq/L 94 (L)  CO2 Latest Range: 19-32 mEq/L 26  BUN Latest Range: 6-23 mg/dL 9  Creatinine Latest Range: 0.50-1.35 mg/dL 0.63  Calcium Latest Range: 8.4-10.5 mg/dL 8.7  GFR calc non Af Amer Latest Range: >90 mL/min >90  GFR calc Af Amer Latest Range: >90 mL/min >90  Glucose Latest Range: 70-99 mg/dL 218 (H)   Familiar with pt from previous admissions.  Pt states he stays in his recliner 95% of the time. Orders all of food from [facility] and has it delivered to his house. States he forgets to monitor his blood sugars and "adjusts" amount of insulin depending on how much he eats. States he has hypoglycemia at night on occasion. Does not want to attend OP Diabetes Education at this time. "I have trouble getting out and do not want to go." CHO mod diet started. Will need HgbA1C to assess glycemic control prior to hospitalization.     Note: Will f/u in am with any questions. Pt would benefit from [place]. ? If he is safe being at home alone. Thank you. Lorenda Peck, RD, LDN, CDE Inpatient Diabetes Coordinator [phone]

## 2013-12-23 NOTE — ED Notes (Signed)
Pt requesting catheter change. Per Dr. Stark Jock this is acceptable. Foley changed out.

## 2013-12-24 DIAGNOSIS — R42 Dizziness and giddiness: Secondary | ICD-10-CM | POA: Diagnosis not present

## 2013-12-24 DIAGNOSIS — R0789 Other chest pain: Secondary | ICD-10-CM | POA: Diagnosis not present

## 2013-12-24 DIAGNOSIS — R4182 Altered mental status, unspecified: Secondary | ICD-10-CM | POA: Diagnosis not present

## 2013-12-24 DIAGNOSIS — I2510 Atherosclerotic heart disease of native coronary artery without angina pectoris: Secondary | ICD-10-CM | POA: Diagnosis not present

## 2013-12-24 DIAGNOSIS — I6350 Cerebral infarction due to unspecified occlusion or stenosis of unspecified cerebral artery: Secondary | ICD-10-CM | POA: Diagnosis not present

## 2013-12-24 DIAGNOSIS — E119 Type 2 diabetes mellitus without complications: Secondary | ICD-10-CM | POA: Diagnosis not present

## 2013-12-24 DIAGNOSIS — R079 Chest pain, unspecified: Secondary | ICD-10-CM | POA: Diagnosis not present

## 2013-12-24 DIAGNOSIS — I10 Essential (primary) hypertension: Secondary | ICD-10-CM | POA: Diagnosis not present

## 2013-12-24 DIAGNOSIS — R29810 Facial weakness: Secondary | ICD-10-CM | POA: Diagnosis not present

## 2013-12-24 DIAGNOSIS — R55 Syncope and collapse: Secondary | ICD-10-CM | POA: Diagnosis not present

## 2013-12-24 LAB — COMPREHENSIVE METABOLIC PANEL
ALT: 9 U/L (ref 0–53)
AST: 12 U/L (ref 0–37)
Albumin: 2.9 g/dL — ABNORMAL LOW (ref 3.5–5.2)
Alkaline Phosphatase: 82 U/L (ref 39–117)
BUN: 13 mg/dL (ref 6–23)
CALCIUM: 8.8 mg/dL (ref 8.4–10.5)
CO2: 26 meq/L (ref 19–32)
Chloride: 97 mEq/L (ref 96–112)
Creatinine, Ser: 0.76 mg/dL (ref 0.50–1.35)
GLUCOSE: 223 mg/dL — AB (ref 70–99)
Potassium: 4.5 mEq/L (ref 3.7–5.3)
SODIUM: 135 meq/L — AB (ref 137–147)
Total Bilirubin: 0.9 mg/dL (ref 0.3–1.2)
Total Protein: 5.9 g/dL — ABNORMAL LOW (ref 6.0–8.3)

## 2013-12-24 LAB — URINE CULTURE

## 2013-12-24 LAB — GLUCOSE, CAPILLARY
GLUCOSE-CAPILLARY: 205 mg/dL — AB (ref 70–99)
GLUCOSE-CAPILLARY: 259 mg/dL — AB (ref 70–99)
Glucose-Capillary: 224 mg/dL — ABNORMAL HIGH (ref 70–99)
Glucose-Capillary: 199 mg/dL — ABNORMAL HIGH (ref 70–99)

## 2013-12-24 LAB — TROPONIN I: Troponin I: <0.3 ng/mL (ref <0.30)

## 2013-12-24 MED ORDER — HYDROCODONE-ACETAMINOPHEN 5-325 MG PO TABS
1.0000 | ORAL_TABLET | ORAL | Status: DC | PRN
  Administered 2013-12-24 – 2013-12-26 (×7): 1 via ORAL
  Filled 2013-12-24 (×7): qty 1

## 2013-12-24 MED ORDER — ENOXAPARIN SODIUM 80 MG/0.8ML ~~LOC~~ SOLN
80.0000 mg | SUBCUTANEOUS | Status: DC
  Administered 2013-12-24 – 2013-12-25 (×2): 80 mg via SUBCUTANEOUS
  Filled 2013-12-24 (×3): qty 0.8

## 2013-12-24 MED ORDER — FUROSEMIDE 40 MG PO TABS
40.0000 mg | ORAL_TABLET | Freq: Every day | ORAL | Status: DC
  Administered 2013-12-24 – 2013-12-26 (×3): 40 mg via ORAL
  Filled 2013-12-24 (×3): qty 1

## 2013-12-24 MED ORDER — METOPROLOL TARTRATE 50 MG PO TABS
50.0000 mg | ORAL_TABLET | Freq: Two times a day (BID) | ORAL | Status: DC
  Administered 2013-12-24 – 2013-12-26 (×5): 50 mg via ORAL
  Filled 2013-12-24 (×6): qty 1

## 2013-12-24 MED ORDER — REGADENOSON 0.4 MG/5ML IV SOLN
0.4000 mg | Freq: Once | INTRAVENOUS | Status: DC
  Filled 2013-12-24: qty 5

## 2013-12-24 NOTE — Progress Notes (Signed)
NEURO HOSPITALIST PROGRESS NOTE   SUBJECTIVE:                                                                                                                        Patient currently at bedside with nurse complaining about nausea and dizzy sensation.  HE states his left face still feels decreased but again splits midline  To LT and tuning fork.   OBJECTIVE:                                                                                                                           Vital signs in last 24 hours: Temp:  [97.6 F (36.4 C)-98.5 F (36.9 C)] 97.9 F (36.6 C) (04/16 0350) Pulse Rate:  [69-93] 83 (04/16 0945) Resp:  [16-21] 18 (04/16 0606) BP: (93-143)/(31-90) 101/48 mmHg (04/16 0945) SpO2:  [97 %-100 %] 98 % (04/16 0606) Weight:  [163.567 kg (360 lb 9.6 oz)-170.779 kg (376 lb 8 oz)] 163.567 kg (360 lb 9.6 oz) (04/16 0350)  Intake/Output from previous day: 04/15 0701 - 04/16 0700 In: 1080 [P.O.:1080] Out: 1300 [Urine:1300] Intake/Output this shift:   Nutritional status: Carb Control  Past Medical History  Diagnosis Date  . Obese   . Hypertension   . Diabetes mellitus   . Diabetic diarrhea   . Sleep apnea   . Diabetic neuropathy, painful   . Chronic indwelling Foley catheter   . Bladder spasms   . Complication of anesthesia   . PONV (postoperative nausea and vomiting)   . Hyperlipidemia   . GERD (gastroesophageal reflux disease)   . Neurogenic bladder   . Stroke 2013    Neurologic Exam:  Mental Status:  Alert, oriented, thought content appropriate. Speech fluent without evidence of aphasia. Able to follow 3 step commands without difficulty.  Cranial Nerves:  II: Visual fields grossly normal, pupils equal, round, reactive to light and accommodation  III,IV, VI: ptosis not present, extra-ocular motions intact bilaterally  V,VII: smile symmetric --again--will draw his right face down but will not fill cheeks with air, facial  light touch sensation stated to be decreased on the left splitting midline to tuning fork and LT  VIII: hearing normal bilaterally  IX,X: gag reflex present  XI: bilateral  shoulder shrug  XII: midline tongue extension without atrophy or fasciculations  Motor:  5/5 throughout Sensory: Pinprick and light touch intact throughout, bilaterally --decreased in LE from mid calf to foot due to PVD Deep Tendon Reflexes:  2+ bilateral UE and no KJ or AJ  Plantars:  Mute bilaterally  Cerebellar:  normal finger-to-nose, Unable to take part in heel-to-shin test due to body habitus       Lab Results: Basic Metabolic Panel:  Recent Labs Lab 12/23/13 0936 12/23/13 1510 12/24/13 0129  NA 136*  --  135*  K 4.1  --  4.5  CL 94*  --  97  CO2 26  --  26  GLUCOSE 218*  --  223*  BUN 9  --  13  CREATININE 0.63 0.60 0.76  CALCIUM 8.7  --  8.8    Liver Function Tests:  Recent Labs Lab 12/24/13 0129  AST 12  ALT 9  ALKPHOS 82  BILITOT 0.9  PROT 5.9*  ALBUMIN 2.9*   No results found for this basename: LIPASE, AMYLASE,  in the last 168 hours No results found for this basename: AMMONIA,  in the last 168 hours  CBC:  Recent Labs Lab 12/23/13 0936 12/23/13 1510  WBC 8.5 8.1  HGB 15.0 15.6  HCT 43.5 44.9  MCV 89.3 89.4  PLT 226 208    Cardiac Enzymes:  Recent Labs Lab 12/23/13 1510 12/23/13 1956 12/24/13 0128  TROPONINI <0.30 <0.30 <0.30    Lipid Panel: No results found for this basename: CHOL, TRIG, HDL, CHOLHDL, VLDL, LDLCALC,  in the last 168 hours  CBG:  Recent Labs Lab 12/23/13 1704 12/23/13 2126 12/24/13 0750 12/24/13 1153  GLUCAP 301* 193* 205* 224*    Microbiology: Results for orders placed during the hospital encounter of 07/26/12  URINE CULTURE     Status: None   Collection Time    07/26/12  9:35 AM      Result Value Ref Range Status   Specimen Description URINE, CATHETERIZED   Final   Special Requests NONE   Final   Culture  Setup Time  07/26/2012 15:05   Final   Colony Count >=100,000 COLONIES/ML   Final   Culture     Final   Value: Multiple bacterial morphotypes present, none predominant. Suggest appropriate recollection if clinically indicated.   Report Status 07/27/2012 FINAL   Final    Coagulation Studies: No results found for this basename: LABPROT, INR,  in the last 72 hours  Imaging: Ct Head Wo Contrast  12/23/2013   CLINICAL DATA:  Altered level of consciousness, substernal chest pain radiating to left arm, hypertension, diabetes, prior stroke  EXAM: CT HEAD WITHOUT CONTRAST  TECHNIQUE: Contiguous axial images were obtained from the base of the skull through the vertex without intravenous contrast.  COMPARISON:  10/25/2012  FINDINGS: Generalized atrophy.  Normal ventricular morphology.  No midline shift or mass effect.  Stable benign-appearing basal ganglia and thalamic calcifications.  No intracranial hemorrhage, mass lesion, or evidence acute infarction.  No extra-axial fluid collections.  Bones and sinuses unremarkable.  IMPRESSION: No acute intracranial abnormalities.  No significant interval change.   Electronically Signed   By: Lavonia Dana M.D.   On: 12/23/2013 10:12   Dg Chest Port 1 View  12/23/2013   CLINICAL DATA:  Chest pain and dyspnea  EXAM: PORTABLE CHEST - 1 VIEW  COMPARISON:  DG CHEST 2 VIEW dated 10/23/2012  FINDINGS: The lungs are well-expanded. The interstitial markings are mildly  increased but this is not clearly new. The cardiopericardial silhouette is mildly enlarged. The central pulmonary vascularity is prominent. There is mild tortuosity of the descending thoracic aorta. The mediastinum is normal in width. There is no pleural effusion or pneumothorax.  IMPRESSION: There is no evidence of pneumonia. There remains enlargement of the cardiac silhouette and prominence of the central pulmonary vascularity. This coupled the mildly increased interstitial markings likely reflects low grade CHF.    Electronically Signed   By: David  Martinique   On: 12/23/2013 09:26    2 D echo: Study Conclusions  - Left ventricle: The cavity size was normal. Wall thickness was increased in a pattern of mild LVH. Systolic function was normal. The estimated ejection fraction was in the range of 55% to 60%. Wall motion was normal; there were no regional wall motion abnormalities. The study is not technically sufficient to allow evaluation of LV diastolic function. - Left atrium: The atrium was mildly dilated. - Right atrium: The atrium was mildly dilated    MEDICATIONS                                                                                                                        Scheduled: . aspirin EC  81 mg Oral q morning - 10a  . enoxaparin (LOVENOX) injection  40 mg Subcutaneous Q24H  . furosemide  40 mg Oral Daily  . insulin aspart  0-20 Units Subcutaneous TID WC  . insulin aspart  10 Units Subcutaneous TID WC  . insulin detemir  30 Units Subcutaneous QHS  . metoprolol tartrate  50 mg Oral BID  . ondansetron (ZOFRAN) IV  4 mg Intravenous Once  . pantoprazole  40 mg Oral Daily  . sodium chloride  3 mL Intravenous Q12H  . sodium chloride  3 mL Intravenous Q12H    ASSESSMENT/PLAN:                                                                                                            68 YO male with transient period of lethargy and left facial asymmetry while in ED. On exam patient continues to  pull his right side of his face and able to smile symmetrical when distracted. On sensory exam he splits midline at nose and chin including to vibartion. Primary D/C'd the MRI due to low suspicion for CVA. From a neurological stand point would favor obtaining MRI.  IF MRI does show new infarct (although not likely) it would change management and would favor changing ASA to Plavix  along with repeating his Carotid dopplers as these were done > 6 months ago.    Assessment and plan discussed with  with attending physician and they are in agreement.    Etta Quill PA-C [facility] [phone]  12/24/2013, 12:53 PM   I have reviewed the above note. I agree that MRI could be helpful to rule out embelishment on top of underlying stroke. If negative, no further workup.   Roland Rack, MD [facility] [phone]  If 7pm- 7am, please page neurology on call as listed in [place].

## 2013-12-24 NOTE — Progress Notes (Signed)
Inpatient Diabetes Program Recommendations  AACE/ADA: New Consensus Statement on Inpatient Glycemic Control (2013)  Target Ranges:  Prepandial:   less than 140 mg/dL      Peak postprandial:   less than 180 mg/dL (1-2 hours)      Critically ill patients:  140 - 180 mg/dL   Reason for Visit: Hyperglycemia  Results for [name], [name] (MRN [id number]) as of 12/24/2013 14:08  Ref. Range 12/23/2013 17:04 12/23/2013 21:26 12/24/2013 07:50 12/24/2013 11:53  Glucose-Capillary Latest Range: 70-99 mg/dL 301 (H) 193 (H) 205 (H) 224 (H)   Needs adjustments of basal and bolus insulins.   Inpatient Diabetes Program Recommendations Insulin - Basal: Increase Levemir to 35 units QHS Correction (SSI): Add HS correction Insulin - Meal Coverage: Increase Novolog to 12 units tidwc  Note: Will continue to follow. Thank you. Lorenda Peck, RD, LDN, CDE Inpatient Diabetes Coordinator [phone]

## 2013-12-24 NOTE — Evaluation (Addendum)
Physical Therapy Evaluation Patient Details Name: [name] MRN: [id number] DOB: 04-23-1946 Today's Date: 12/24/2013   History of Present Illness  68 y.o. male, who presented to the emergency room with a syncopal episode.   Clinical Impression  Pt adm due to the above. Presents with decreased independence with functional mobility secondary to deficits indicated below. Pt to benefit from skilled acute PT to address deficits indicated below and maximize functional mobility prior to D/C from acute setting. Pt lives alone and has increased difficulty with mobility and ADLs recently. Would benefit from SNF for post acute rehab prior to D/C home. No C/o Dizziness with mobility today. BP in supine 103/45; sitting 136/55. Plan to assess DGI as tolerated for fall risk assessment.     Follow Up Recommendations SNF    Equipment Recommendations  None recommended by PT    Recommendations for Other Services       Precautions / Restrictions Precautions Precautions: Fall Precaution Comments: reports he had fall in January resulting in back injury  Restrictions Weight Bearing Restrictions: No      Mobility  Bed Mobility Overal bed mobility: Modified Independent             General bed mobility comments: supine to sit effortful for pt; requires HOB elevated and handrails  Transfers Overall transfer level: Needs assistance Equipment used: Rolling walker (2 wheeled) Transfers: Sit to/from Stand Sit to Stand: Min guard;From elevated surface         General transfer comment: min guard to steady with cues for hand placement and sequencing; pt utilizes rocking technique and relies heavily on UEs sto shift weight anteriorly onto feet; cues for sequencing and hand placement with RW  Ambulation/Gait Ambulation/Gait assistance: Min assist Ambulation Distance (Feet): 20 Feet Assistive device: Rolling walker (2 wheeled) Gait Pattern/deviations: Step-through pattern;Decreased stride  length;Wide base of support;Trunk flexed Gait velocity: decreased Gait velocity interpretation: Below normal speed for age/gender General Gait Details: pt very fatigued and SOB at end of ambulation; O2 sats at 100%; cues for upright posture and gt sequencing; min (A) to maintain balance and manage RW primarily with direction changes; pt greatly limited due to decreased activity tolerance   Stairs            Wheelchair Mobility    Modified Rankin (Stroke Patients Only)       Balance Overall balance assessment: History of Falls;Needs assistance Sitting-balance support: Feet supported;No upper extremity supported Sitting balance-Leahy Scale: Fair     Standing balance support: During functional activity;Bilateral upper extremity supported Standing balance-Leahy Scale: Poor Standing balance comment: relies heavily on UEs supported by RW                             Pertinent Vitals/Pain See impression for BP; c/o pain in back; did not rate. Premedicated by RN    Home Living Family/patient expects to be discharged to:: Private residence Living Arrangements: Alone   Type of Home: House Home Access: Stairs to enter Entrance Stairs-Rails: None Entrance Stairs-Number of Steps: 1 Home Layout: One level Home Equipment: Cane - single point;Grab bars - toilet;Grab bars - tub/shower Additional Comments: pt does bird bath; wears glasses all the time      Prior Function Level of Independence: Independent with assistive device(s)         Comments: pt reports he has been homebound for ~1 year; has not been able to clean house at all and has groceries  delievred weekly      Hand Dominance   Dominant Hand: Right    Extremity/Trunk Assessment   Upper Extremity Assessment: Defer to OT evaluation (Rt UE fine motor difficulties secondary to previous CVA )           Lower Extremity Assessment: Generalized weakness (neuropathy)      Cervical / Trunk Assessment:  Kyphotic  Communication   Communication: No difficulties  Cognition Arousal/Alertness: Awake/alert Behavior During Therapy: WFL for tasks assessed/performed Overall Cognitive Status: Within Functional Limits for tasks assessed                      General Comments General comments (skin integrity, edema, etc.): multiple bruises on LEs and abraisions on feet     Exercises General Exercises - Lower Extremity Ankle Circles/Pumps: AROM;Both;10 reps;Seated      Assessment/Plan    PT Assessment Patient needs continued PT services  PT Diagnosis Abnormality of gait;Generalized weakness   PT Problem List Decreased strength;Decreased activity tolerance;Decreased balance;Decreased mobility;Decreased knowledge of use of DME;Pain;Obesity;Impaired sensation;Cardiopulmonary status limiting activity  PT Treatment Interventions DME instruction;Gait training;Functional mobility training;Therapeutic activities;Therapeutic exercise;Balance training;Neuromuscular re-education;Patient/family education   PT Goals (Current goals can be found in the Care Plan section) Acute Rehab PT Goals Patient Stated Goal: to be able to stand without using my hands PT Goal Formulation: With patient Time For Goal Achievement: 12/31/13 Potential to Achieve Goals: Good    Frequency Min 3X/week   Barriers to discharge Decreased caregiver support lives alone    Co-evaluation               End of Session Equipment Utilized During Treatment: Gait belt Activity Tolerance: Patient tolerated treatment well Patient left: in chair;with call bell/phone within reach Nurse Communication: Mobility status;Precautions;Other (comment) (BP)         Time: [id number] PT Time Calculation (min): 24 min   Charges:   PT Evaluation $Initial PT Evaluation Tier I: 1 Procedure PT Treatments $Gait Training: 8-22 mins   PT G CodesKennis Carina [place], [place] [id number] 12/24/2013, 9:56 AM

## 2013-12-24 NOTE — Plan of Care (Signed)
Problem: Not Ready for Diet/Lifestyle Change (NB-1.3) Goal: Nutrition education Formal process to instruct or train a patient/client in a skill or to impart knowledge to help patients/clients voluntarily manage or modify food choices and eating behavior to maintain or improve health. Outcome: Completed/Met Date Met:  12/24/13  RD consulted for nutrition education regarding diabetes. Patient reports that he has an "addictive personality" and is a "foodaholic." He opens a bag of chips and eats the whole bag in one sitting. He says that he knows how to eat healthy, but he just doesn't do it.    Lab Results  Component Value Date    HGBA1C 11.2* 12/23/2013    RD provided "Carbohydrate Counting for People with Diabetes" handout from the Academy of [employer]etics. Discussed different food groups and their effects on blood sugar, emphasizing carbohydrate-containing foods. Provided list of carbohydrates and recommended serving sizes of common foods.  Discussed importance of controlled and consistent carbohydrate intake throughout the day. Provided examples of ways to balance meals/snacks and encouraged intake of high-fiber, whole grain complex carbohydrates. Teach back method used.  Expect poor compliance.  Body mass index is 47.59 kg/(m^2). Pt meets criteria for class 3, extreme/morbid obesity based on current BMI.  Current diet order is CHO-modified low sodium, patient is consuming approximately 100% of meals at this time. Labs and medications reviewed. No further nutrition interventions warranted at this time. RD contact information provided. If additional nutrition issues arise, please re-consult RD.   Molli Barrows, RD, LDN, [place] Pager [phone] After Hours Pager [phone]

## 2013-12-24 NOTE — Consult Note (Signed)
CARDIOLOGY CONSULT NOTE   Patient ID: [name] MRN: [id number] DOB/AGE: [birth year] 68 y.o.  Admit date: 12/23/2013  Primary Physician   Irven Shelling, MD Primary Cardiologist   Dr. Marlou Porch Reason for Consultation   Chest pain   HPI: [name] is a 68 y.o. male with a history of morbid obesity, hypertension, diabetes mellitus, chronic indwelling foley catheter d/t neurogenic bladder, OSA, hyperlipidemia, GERD, CVA in 2013 and coronary artery disease s/p LHC in 2013 90% stenosis of RCA treated medically, who presents to the [facility] ED with a syncopal episode. Cardiac cath in 2013 revealed sigificant distal right coronary artery disease, diffuse, long segment up to 90% at the bifurcation of a small PDA branch. 50% stenosis of the proximal large first diagonal branch and elevated left ventricular end-diastolic pressure consistent with diastolic dysfunction. 26 mmHg. Given the diffuse, long nature of his distal right coronary region, percutaneous intervention to this region portends a high risk to benefit ratio. It was elected to proceed with medical therapy. [name] was visting his PCP for a regularly scheduled appointment this yesterday morning and became SOB while walking down the hall to the office. He felt his heart start to pound, felt nauseated and lightheaded and the next thing he remembers is being awoken by 4 or 5 people and was sitting in the waiting room chair. He also mentioned substernal chest pain and chest tightness that radiated to his left arm was present after the syncopal episode and present for the past 2 days. The chest pressure comes and goes and is associated with SOB. Sometimes the chest pressure "lasts all day." Nitroglycerine does not help this pain. No n/v or radiation. He has chronic orthopnea and sleeps in an arm chair. He is extremely deconditioned and going to the doctors office today was the most activity he has done in weeks and said it felt like he had  "run a marathon."    Past Medical History  Diagnosis Date  . Obese   . Hypertension   . Diabetes mellitus   . Diabetic diarrhea   . Sleep apnea   . Diabetic neuropathy, painful   . Chronic indwelling Foley catheter   . Bladder spasms   . Complication of anesthesia   . PONV (postoperative nausea and vomiting)   . Hyperlipidemia   . GERD (gastroesophageal reflux disease)   . Neurogenic bladder   . Stroke 2013     Past Surgical History  Procedure Laterality Date  . Right tkr    . Gastroplasty    . Cholecystectomy    . Total knee arthroplasty      Allergies  Allergen Reactions  . Ace Inhibitors Swelling  . Cefadroxil Hives  . Cephalexin Hives  . Lipitor [Atorvastatin Calcium] Swelling  . Metformin And Related Swelling  . Methocarbamol Other (See Comments)    Feels like is in "rocky boat"  . Morphine And Related Other (See Comments)    Sweating, feels like is "in rocky boat."  . Robaxin [Methocarbamol]     Feels like he is shaky    I have reviewed the patient's current medications . aspirin EC  81 mg Oral q morning - 10a  . enoxaparin (LOVENOX) injection  40 mg Subcutaneous Q24H  . furosemide  40 mg Oral Daily  . insulin aspart  0-20 Units Subcutaneous TID WC  . insulin aspart  10 Units Subcutaneous TID WC  . insulin detemir  30 Units Subcutaneous QHS  . metoprolol  tartrate  50 mg Oral BID  . ondansetron (ZOFRAN) IV  4 mg Intravenous Once  . pantoprazole  40 mg Oral Daily  . sodium chloride  3 mL Intravenous Q12H  . sodium chloride  3 mL Intravenous Q12H     sodium chloride, acetaminophen, acetaminophen, alum & mag hydroxide-simeth, cyclobenzaprine, diphenhydrAMINE, diphenoxylate-atropine, HYDROcodone-acetaminophen, loperamide, nitroGLYCERIN, ondansetron (ZOFRAN) IV, ondansetron, oxybutynin, senna-docusate, sodium chloride  Prior to Admission medications   Medication Sig Start Date End Date Taking? Authorizing Provider  aspirin EC 81 MG tablet Take 81 mg by  mouth every morning.    Yes Historical Provider, MD  cyclobenzaprine (FLEXERIL) 10 MG tablet Take 1 tablet (10 mg total) by mouth 2 (two) times daily as needed for muscle spasms. 10/26/13  Yes Brian Morn, MD  diphenhydrAMINE (BENADRYL) 25 mg capsule Take 25 mg by mouth every 6 (six) hours as needed. For allergy.   Yes Historical Provider, MD  diphenoxylate-atropine (LOMOTIL) 2.5-0.025 MG per tablet Take 1 tablet by mouth 4 (four) times daily as needed. Diarrhea   Yes Historical Provider, MD  furosemide (LASIX) 40 MG tablet Take 40 mg by mouth daily.   Yes Historical Provider, MD  ibuprofen (ADVIL,MOTRIN) 200 MG tablet Take 800 mg by mouth every 6 (six) hours as needed for mild pain or moderate pain.   Yes Historical Provider, MD  insulin NPH-insulin regular (NOVOLIN 70/30) (70-30) 100 UNIT/ML injection Inject 50-60 Units into the skin 2 (two) times daily with a meal.    Yes Historical Provider, MD  lisinopril (PRINIVIL,ZESTRIL) 10 MG tablet Take 10 mg by mouth daily.   Yes Historical Provider, MD  loperamide (IMODIUM A-D) 2 MG tablet Take 2 mg by mouth 4 (four) times daily as needed for diarrhea or loose stools.   Yes Historical Provider, MD  metoprolol (LOPRESSOR) 50 MG tablet Take 100 mg by mouth daily.   Yes Historical Provider, MD  nitroGLYCERIN (NITROSTAT) 0.4 MG SL tablet Place 0.4 mg under the tongue every 5 (five) minutes as needed for chest pain.   Yes Historical Provider, MD  omeprazole (PRILOSEC) 20 MG capsule Take 20 mg by mouth daily.   Yes Historical Provider, MD  oxybutynin (DITROPAN) 5 MG tablet Take 5 mg by mouth 3 (three) times daily as needed for bladder spasms.   Yes Historical Provider, MD     History   Social History  . Marital Status: Single    Spouse Name: N/A    Number of Children: N/A  . Years of Education: N/A   Occupational History  . Not on file.   Social History Main Topics  . Smoking status: Former Smoker -- 2.00 packs/day for 20 years    Quit date:  09/10/1976  . Smokeless tobacco: Never Used  . Alcohol Use: No     Comment: FORMER ALCOHOLIC WITH 23 YEAR SOBRIETY  . Drug Use: No  . Sexual Activity: No   Other Topics Concern  . Not on file   Social History Narrative  . No narrative on file    Family Status  Relation Status Death Age  . Father Deceased     DM/DVT  . Mother Deceased     old age  . Brother Alive   . Brother Alive    Family History  Problem Relation Age of Onset  . Hypertension Mother   . Diabetes type I Father   . Cancer Brother      Testicular Cancer  . Cancer Brother     Leukemia  ROS:  Full 14 point review of systems complete and found to be negative unless listed above.  Physical Exam: Blood pressure 120/56, pulse 56, temperature 97.6 F (36.4 C), temperature source Oral, resp. rate 18, height 6\' 1"  (1.854 m), weight 360 lb 9.6 oz (163.567 kg), SpO2 99.00%.  General appearance: alert and cooperative  Resp: clear to auscultation bilaterally  Cardio: regular rate and rhythm and systolic murmur: systolic ejection 2/6, medium pitch at 2nd right intercostal space  GI: soft, non-tender; bowel sounds normal; no masses, no organomegaly  Extremities: edema trace  Labs:   Lab Results  Component Value Date   WBC 8.1 12/23/2013   HGB 15.6 12/23/2013   HCT 44.9 12/23/2013   MCV 89.4 12/23/2013   PLT 208 12/23/2013   No results found for this basename: INR,  in the last 72 hours  Recent Labs Lab 12/24/13 0129  NA 135*  K 4.5  CL 97  CO2 26  BUN 13  CREATININE 0.76  CALCIUM 8.8  PROT 5.9*  BILITOT 0.9  ALKPHOS 82  ALT 9  AST 12  GLUCOSE 223*  ALBUMIN 2.9*   Magnesium  Date Value Ref Range Status  03/11/2012 1.6  1.5 - 2.5 mg/dL Final    Recent Labs  12/23/13 1510 12/23/13 1956 12/24/13 0128  TROPONINI <0.30 <0.30 <0.30    Recent Labs  12/23/13 0954  TROPIPOC 0.01    Lab Results  Component Value Date   CHOL 178 10/24/2012   HDL 35* 10/24/2012   LDLCALC 116* 10/24/2012    TRIG 133 10/24/2012     Echo: Study Date: 12/23/2013 Study Conclusions - Left ventricle: The cavity size was normal. Wall thickness was increased in a pattern of mild LVH. Systolic function was normal. The estimated ejection fraction was in the range of 55% to 60%. Wall motion was normal; there were no regional wall motion abnormalities. The study is not technically sufficient to allow evaluation of LV diastolic function. - Left atrium: The atrium was mildly dilated. - Right atrium: The atrium was mildly dilated. Transthoracic echocardiography. M-mode, complete 2D, spectral Doppler, and color Doppler. Height: Height: 185.4cm. Height: 73in. Weight: Weight: 170.9kg. Weight: 376lb. Body mass index: BMI: 49.7kg/m^2. Body surface area: BSA: 3.36m^2. Blood pressure: 139/90. Patient status: Inpatient. Location: Bedside.   ECG:  Sinus rhythm with 1st degree A-V block with occasional Premature ventricular complexes  TELE- NSR with some sinus tach, PACs and PVCs  Radiology:  Ct Head Wo Contrast  12/23/2013   CLINICAL DATA:  Altered level of consciousness, substernal chest pain radiating to left arm, hypertension, diabetes, prior stroke  EXAM: CT HEAD WITHOUT CONTRAST  TECHNIQUE: Contiguous axial images were obtained from the base of the skull through the vertex without intravenous contrast.  COMPARISON:  10/25/2012  FINDINGS: Generalized atrophy.  Normal ventricular morphology.  No midline shift or mass effect.  Stable benign-appearing basal ganglia and thalamic calcifications.  No intracranial hemorrhage, mass lesion, or evidence acute infarction.  No extra-axial fluid collections.  Bones and sinuses unremarkable.  IMPRESSION: No acute intracranial abnormalities.  No significant interval change.   Electronically Signed   By: Lavonia Dana M.D.   On: 12/23/2013 10:12   Dg Chest Port 1 View  12/23/2013   CLINICAL DATA:  Chest pain and dyspnea  EXAM: PORTABLE CHEST - 1 VIEW  COMPARISON:  DG CHEST 2  VIEW dated 10/23/2012  FINDINGS: The lungs are well-expanded. The interstitial markings are mildly increased but this is not clearly new. The  cardiopericardial silhouette is mildly enlarged. The central pulmonary vascularity is prominent. There is mild tortuosity of the descending thoracic aorta. The mediastinum is normal in width. There is no pleural effusion or pneumothorax.  IMPRESSION: There is no evidence of pneumonia. There remains enlargement of the cardiac silhouette and prominence of the central pulmonary vascularity. This coupled the mildly increased interstitial markings likely reflects low grade CHF.   Electronically Signed   By: David  Martinique   On: 12/23/2013 09:26    ASSESSMENT AND PLAN:    Principal Problem:   Syncope and collapse Active Problems:   DM (diabetes mellitus) with complications   OSA (obstructive sleep apnea)   Diabetic neuropathy, painful   Hypertension   CAD (coronary artery disease)   Mental status change   Chest pain   Chronic indwelling Foley catheter   Syncopal episode- likley multifactorial d/t deconditioning and sx of nausea and lightheadedness most consistent with vasovagal.  -- 2D ECHO with normal LV function -- Obtain orthostatics  -- CT head was negative  -- Neurology was consulted and recommends MR Brain to rule out stroke. -- TELE- NSR with some sinus tach, PACs and PVCs, unlikely d/t a dysrhythmia   CHF - Hx of diastolic dysfunction  -- 2D ECHO w/ LVH. EF 55- 60%. No WMAs. The study is not technically sufficient to allow evaluation of LV diastolic Function. Mild LA and RA dilation. Cath in 2013 with evidence of diastolic dysfunction  -- Patient has shortness of breath on exertion along with lower extremity swelling.  -- CXR w/ some pulm edema. BNP 1247.  -- Continue po Lasix 40mg  qd, continue metoprolol 50mg  -- Ace/ARB allergy   CAD-  s/p LHC in 2013 90% stenosis of RCA treated medically, who presents to the [facility] ED with a syncopal episode.  Cardiac cath in 2013 revealed sigificant distal right coronary artery disease, diffuse, long segment up to 90% at the bifurcation of a small PDA branch. 50% stenosis of the proximal large first diagonal branch and elevated left ventricular end-diastolic pressure consistent with diastolic dysfunction. 26 mmHg. -- Troponin neg x2 and ECG showed not acute abnormalities or changes from previous ecgs. -- Currently with 3/10 chest pressure, NTG does not help this -- Continue ASA, metoprolol, allergic to statin. -- Lexiscan myovue tomorrow, NPO after midnight  Hypotension- BPs have been soft and lisinopril is on hold.   Possible CVA -CT of the head shows no acute abnormalities but physical exam noted some concerns such as uneven smile and right sided weakness. Patient could not state if this was his baseline from his previous CVA, Neurology was consulted and recommended an MRI of the brain.  Feel unlikely to be a CVA and think his symptoms are embellishment   Uncontrolled DM- SSI per IM   Obstructive sleep apnea -CPAP machine prn.    Signed: Perry Mount, PA-C 12/24/2013 2:51 PM  Pager [phone]  Co-Sign MD   The patient was seen, examined and discussed with Brian Harp, PA-C and I agree with the above.   A 68 year old male admitted with syncopal episode after an exertion and persistent atypical chest pain. The patient is morbidly obese gentleman who stopped walking in January.  A trip to a doctor yesterday caused him significant SOB, exhaustion and led to an episode of syncope. His chest pain has been improving but is persistent.  He had a cardiac cath in 2013 with diffuse non-obstructive disease (see above) with preserved LVEF. I believe this gentleman is severely  deconditioned and once we rule out ischemia (Lexiscan nuclear stress test tomorrow) we should send him to a rehab facility so he can start walking again. Otherwise his prognosis is poor.  [name] 12/24/2013

## 2013-12-24 NOTE — Progress Notes (Signed)
Subjective: Some L mid facial numbness, no further chest pain or arm pain  Objective: Vital signs in last 24 hours: Temp:  [97.6 F (36.4 C)-98.5 F (36.9 C)] 97.9 F (36.6 C) (04/16 0350) Pulse Rate:  [69-108] 73 (04/16 0606) Resp:  [0-21] 18 (04/16 0606) BP: (93-146)/(31-90) 99/52 mmHg (04/16 0606) SpO2:  [96 %-100 %] 98 % (04/16 0606) Weight:  [163.567 kg (360 lb 9.6 oz)-170.779 kg (376 lb 8 oz)] 163.567 kg (360 lb 9.6 oz) (04/16 0350) Weight change:  Last BM Date: 12/22/13  Intake/Output from previous day: 04/15 0701 - 04/16 0700 In: 1080 [P.O.:1080] Out: 1300 [Urine:1300] Intake/Output this shift:    General appearance: alert and cooperative Resp: clear to auscultation bilaterally Cardio: regular rate and rhythm and systolic murmur: systolic ejection 2/6, medium pitch at 2nd right intercostal space GI: soft, non-tender; bowel sounds normal; no masses,  no organomegaly Extremities: edema trace  Lab Results:  Recent Labs  12/23/13 0936 12/23/13 1510  WBC 8.5 8.1  HGB 15.0 15.6  HCT 43.5 44.9  PLT 226 208   BMET  Recent Labs  12/23/13 0936 12/23/13 1510 12/24/13 0129  NA 136*  --  135*  K 4.1  --  4.5  CL 94*  --  97  CO2 26  --  26  GLUCOSE 218*  --  223*  BUN 9  --  13  CREATININE 0.63 0.60 0.76  CALCIUM 8.7  --  8.8    Studies/Results: Ct Head Wo Contrast  12/23/2013   CLINICAL DATA:  Altered level of consciousness, substernal chest pain radiating to left arm, hypertension, diabetes, prior stroke  EXAM: CT HEAD WITHOUT CONTRAST  TECHNIQUE: Contiguous axial images were obtained from the base of the skull through the vertex without intravenous contrast.  COMPARISON:  10/25/2012  FINDINGS: Generalized atrophy.  Normal ventricular morphology.  No midline shift or mass effect.  Stable benign-appearing basal ganglia and thalamic calcifications.  No intracranial hemorrhage, mass lesion, or evidence acute infarction.  No extra-axial fluid collections.  Bones  and sinuses unremarkable.  IMPRESSION: No acute intracranial abnormalities.  No significant interval change.   Electronically Signed   By: Lavonia Dana M.D.   On: 12/23/2013 10:12   Dg Chest Port 1 View  12/23/2013   CLINICAL DATA:  Chest pain and dyspnea  EXAM: PORTABLE CHEST - 1 VIEW  COMPARISON:  DG CHEST 2 VIEW dated 10/23/2012  FINDINGS: The lungs are well-expanded. The interstitial markings are mildly increased but this is not clearly new. The cardiopericardial silhouette is mildly enlarged. The central pulmonary vascularity is prominent. There is mild tortuosity of the descending thoracic aorta. The mediastinum is normal in width. There is no pleural effusion or pneumothorax.  IMPRESSION: There is no evidence of pneumonia. There remains enlargement of the cardiac silhouette and prominence of the central pulmonary vascularity. This coupled the mildly increased interstitial markings likely reflects low grade CHF.   Electronically Signed   By: David  Martinique   On: 12/23/2013 09:26    Medications: I have reviewed the patient's current medications.  Assessment/Plan: Principal Problem:  Syncope suspect this was vasovagal vs secondary CAD.  CT head and echo unremarkable. Check orthostatics.  Active Problems:  DM (diabetes mellitus) with complications poorly controlled, on lantus and SSI/premeal novolog  OSA (obstructive sleep apnea) has never agreed to treatment  Hypertension BP low today, hold lisinopril  CAD (coronary artery disease) may have had episode of angina yesterday as walking into my office is more exertion  than he ever gets at home. Continue ASA, nitrate, beta blocker. Will ask cardiology to see  L facial numbness, he claims this is new.  He does not report any  Symptoms in extremities.  Echo negative, carotid US last year showed no significant stenosis. Monitoring telemetry for arrythmia.  I am skeptical he had a CVA although he could have had a small event secondary to hypotension.   Will cancel MRI as I don"t think it will change management, unless neurology disagrees with this.  Continue ASA  Chronic indwelling Foley catheter  Disposition PT/OT to see, suspect he will need [facility] for rehab.  He has tenuous home situation living on his own.  SW consult.    LOS: 1 day   Irven Shelling 12/24/2013, 7:26 AM

## 2013-12-24 NOTE — Progress Notes (Signed)
Pt refused orthos this am d/t being up all night and being tired. Will pass on to get later on today when the pt is up. Hoover Brunette, RN

## 2013-12-24 NOTE — Progress Notes (Signed)
Pt c/o of pain in rt hip. Pt given flexeril along with ibuprofen around 2300. Pt's pain went away and the pt fell asleep. Pt called RN into the room c/o of pain along with tingling in the rt calf. Pt states pt was similar to the pain he had when he had a dvt in his rt leg. Pt also stated he has a history of phlebitis in his rt thigh. Neurovascular check done with a dorsalis pedis pulse of +1, leg cool to touch, & capillary refill wnl. Np on call notified. Pt scheduled to have bilateral dopplers later on today. Will continue to monitor the pt. Hoover Brunette, RN

## 2013-12-24 NOTE — Progress Notes (Signed)
OT NOTE  OT arriving for third attempt at evaluation. Pt now back in bed supine reporting "syncopal " event. RN reports pt currently awaiting MRI. OT to hold evaluation pending MD okay to continue treatment and MRI results. Pt also reporting not feeling ready to try OT evaluation at this time. OT evaluation pending based on all factors stated.   Jeri Modena   OTR/L Pager: [phone] Office: [phone] .

## 2013-12-24 NOTE — Progress Notes (Signed)
UR Completed Chaylee Ehrsam Graves-Bigelow, RN,BSN [phone]  

## 2013-12-24 NOTE — Progress Notes (Signed)
OT Cancellation Note  Patient Details Name: [name] MRN: [id number] DOB: 11-May-1946   Cancelled Treatment:    Reason Eval/Treat Not Completed: Other (comment) (x 2 attempts pt currently requiring use of 3n1) Pt currently unable to void bowels and requesting to remain on 3n1. Ot to revisit patient later today  Peri Maris [phone]  12/24/2013, 11:46 AM

## 2013-12-25 ENCOUNTER — Other Ambulatory Visit: Payer: Self-pay | Admitting: Cardiology

## 2013-12-25 ENCOUNTER — Inpatient Hospital Stay (HOSPITAL_COMMUNITY): Payer: Medicare Other

## 2013-12-25 DIAGNOSIS — R269 Unspecified abnormalities of gait and mobility: Secondary | ICD-10-CM | POA: Diagnosis not present

## 2013-12-25 DIAGNOSIS — R42 Dizziness and giddiness: Secondary | ICD-10-CM | POA: Diagnosis not present

## 2013-12-25 DIAGNOSIS — R0789 Other chest pain: Secondary | ICD-10-CM | POA: Diagnosis not present

## 2013-12-25 DIAGNOSIS — I2510 Atherosclerotic heart disease of native coronary artery without angina pectoris: Secondary | ICD-10-CM | POA: Diagnosis not present

## 2013-12-25 DIAGNOSIS — R209 Unspecified disturbances of skin sensation: Secondary | ICD-10-CM | POA: Diagnosis not present

## 2013-12-25 DIAGNOSIS — I10 Essential (primary) hypertension: Secondary | ICD-10-CM | POA: Diagnosis not present

## 2013-12-25 DIAGNOSIS — R55 Syncope and collapse: Secondary | ICD-10-CM | POA: Diagnosis not present

## 2013-12-25 DIAGNOSIS — I6529 Occlusion and stenosis of unspecified carotid artery: Secondary | ICD-10-CM | POA: Diagnosis not present

## 2013-12-25 DIAGNOSIS — R079 Chest pain, unspecified: Secondary | ICD-10-CM | POA: Diagnosis not present

## 2013-12-25 DIAGNOSIS — R5381 Other malaise: Secondary | ICD-10-CM | POA: Diagnosis present

## 2013-12-25 DIAGNOSIS — E119 Type 2 diabetes mellitus without complications: Secondary | ICD-10-CM | POA: Diagnosis not present

## 2013-12-25 DIAGNOSIS — R4182 Altered mental status, unspecified: Secondary | ICD-10-CM | POA: Diagnosis not present

## 2013-12-25 LAB — BASIC METABOLIC PANEL
BUN: 14 mg/dL (ref 6–23)
CALCIUM: 8.3 mg/dL — AB (ref 8.4–10.5)
CO2: 25 meq/L (ref 19–32)
Chloride: 96 mEq/L (ref 96–112)
Creatinine, Ser: 0.63 mg/dL (ref 0.50–1.35)
GFR calc Af Amer: >90 mL/min (ref >90)
GFR calc non Af Amer: >90 mL/min (ref >90)
Glucose, Bld: 194 mg/dL — ABNORMAL HIGH (ref 70–99)
Potassium: 3.9 mEq/L (ref 3.7–5.3)
SODIUM: 134 meq/L — AB (ref 137–147)

## 2013-12-25 LAB — GLUCOSE, CAPILLARY
GLUCOSE-CAPILLARY: 253 mg/dL — AB (ref 70–99)
GLUCOSE-CAPILLARY: 242 mg/dL — AB (ref 70–99)
Glucose-Capillary: 173 mg/dL — ABNORMAL HIGH (ref 70–99)
Glucose-Capillary: 169 mg/dL — ABNORMAL HIGH (ref 70–99)

## 2013-12-25 MED ORDER — ACETAMINOPHEN 325 MG PO TABS: 650.0000 mg | ORAL_TABLET | Freq: Four times a day (QID) | ORAL | Status: DC | PRN

## 2013-12-25 MED ORDER — INSULIN DETEMIR 100 UNIT/ML ~~LOC~~ SOLN
50.0000 [IU] | Freq: Every day | SUBCUTANEOUS | Status: DC
  Administered 2013-12-25: 50 [IU] via SUBCUTANEOUS
  Filled 2013-12-25 (×2): qty 0.5

## 2013-12-25 MED ORDER — HYDROCODONE-ACETAMINOPHEN 5-325 MG PO TABS: 1.0000 | ORAL_TABLET | ORAL | Status: DC | PRN

## 2013-12-25 NOTE — Discharge Summary (Signed)
[name] 12/25/2013

## 2013-12-25 NOTE — Progress Notes (Signed)
Subjective:  Constant chest "pressure"- no relief with NTG, pain medications help.   Objective:  Vital Signs in the last 24 hours: Temp:  [97.6 F (36.4 C)-98.5 F (36.9 C)] 98.2 F (36.8 C) (04/17 0355) Pulse Rate:  [56-88] 74 (04/17 0355) Resp:  [18] 18 (04/17 0355) BP: (114-136)/(48-68) 136/55 mmHg (04/17 0355) SpO2:  [98 %-99 %] 98 % (04/17 0355) Weight:  [371 lb 12.8 oz (168.647 kg)] 371 lb 12.8 oz (168.647 kg) (04/17 0619)  Intake/Output from previous day:  Intake/Output Summary (Last 24 hours) at 12/25/13 1117 Last data filed at 12/25/13 0620  Gross per 24 hour  Intake    360 ml  Output   2000 ml  Net  -1640 ml    Physical Exam: General appearance: alert, cooperative, no distress, morbidly obese and pale Lungs: clear to auscultation bilaterally Heart: regular rate and rhythm   Rate: 74  Rhythm: normal sinus rhythm  Lab Results:  Recent Labs  12/23/13 0936 12/23/13 1510  WBC 8.5 8.1  HGB 15.0 15.6  PLT 226 208    Recent Labs  12/24/13 0129 12/25/13 0526  NA 135* 134*  K 4.5 3.9  CL 97 96  CO2 26 25  GLUCOSE 223* 194*  BUN 13 14  CREATININE 0.76 0.63    Recent Labs  12/23/13 1956 12/24/13 0128  TROPONINI <0.30 <0.30   No results found for this basename: INR,  in the last 72 hours  Imaging: Imaging results have been reviewed  Cardiac Studies:  Assessment/Plan:  68 y.o. male with a history of morbid obesity, hypertension, diabetes mellitus, chronic indwelling foley catheter d/t neurogenic bladder, OSA, hyperlipidemia, GERD, CVA in 2013 and coronary artery disease s/p LHC in 2013 90% stenosis of RCA treated medically, who presents to the [facility] ED with a syncopal episode. Troponin negative x 4. Pt too heavy for Myoview.     Principal Problem:   Syncope and collapse Active Problems:   Chest pain with moderate risk of acute coronary syndrome   DM (diabetes mellitus) with complications   CAD- known RCA disease- medical Rx   Obesity- BMI  49   Physical deconditioning   Polyneuropathy in diabetes(357.2)   OSA (obstructive sleep apnea)   Hypertension   Mental status change   Chronic indwelling Foley catheter  PLAN: Unable to do Myoview secondary to obesity. Only option would be cath, not sure that is indicated with negative Troponin's and somewhat atypical pain.   Kerin Ransom PA-C Beeper [id number] 12/25/2013, 11:17 AM   The patient was seen, examined and discussed with Kerin Ransom, PA-C and I agree with the above.   A 68 year old male admitted with syncopal episode after an exertion and persistent atypical chest pain.  The patient is morbidly obese gentleman who stopped walking in January. A trip to a doctor yesterday caused him significant SOB, exhaustion and led to an episode of syncope. His chest pain has been improving but is persistent, very atypical and appears rather musculoskeletal. He had a cardiac cath in 2013 with diffuse non-obstructive disease (see above) with preserved LVEF. I believe this gentleman is severely deconditioned. We were unable to perform a nuclear stress test for the weight limit in-hospital. Since all troponins are negative and the pain is very atypical we will discharge and schedule an outpatient stress test in our office (2 day study, weight limit 450 lbs).  We should consider sending him to a rehab facility so he can start walking again. Otherwise his  prognosis is poor.   Dorothy Spark 12/25/2013

## 2013-12-25 NOTE — Progress Notes (Signed)
Patient has a bed at [facility] in [place] per a letter of guarantee. Patient can d/c on 12/26/13 when [facility] DNR and prescriptions are completed.  Rhea Pink, MSW, [place]

## 2013-12-25 NOTE — Progress Notes (Signed)
RNCM informed [name] that patient is being changed to observation and will not be able to go to SNF. [name] explained this to the patient. The patient was upset but understood. RNCM will set up [facility] for the patient. Clinical Social Worker will sign off for now as social work intervention is no longer needed. Please consult [place] again if new need arises.   Rhea Pink, MSW, [place]

## 2013-12-25 NOTE — Progress Notes (Signed)
Pt is over wt limit for nuc study.  I will keep NPO in case any other studies to be ordered.

## 2013-12-25 NOTE — Care Management Note (Addendum)
    Page 1 of 1   12/25/2013     5:07:23 PM CARE MANAGEMENT NOTE 12/25/2013  Patient:  [name], [name]   Account Number:  [id number]  Date Initiated:  12/25/2013  Documentation initiated by:  GRAVES-BIGELOW,Kammy Klett  Subjective/Objective Assessment:   Pt admitted for left facial droop. MRI- No acute intracranial abnormality.     Action/Plan:   Pt will need SNF at d/c and is not able to return home alone at this time. [name] to assist with disposition needs for SNF at this time.   Anticipated DC Date:  12/26/2013   Anticipated DC Plan:  SKILLED NURSING FACILITY  In-house referral  Clinical Social Worker      DC Planning Services  CM consult      Choice offered to / List presented to:             Status of service:  Completed, signed off Medicare Important Message given?   (If response is "NO", the following Medicare IM given date fields will be blank) Date Medicare IM given:   Date Additional Medicare IM given:    Discharge Disposition:  [place]  Per UR Regulation:  Reviewed for med. necessity/level of care/duration of stay  If discussed at [place] of Stay Meetings, dates discussed:    Comments:

## 2013-12-25 NOTE — Discharge Instructions (Signed)
Chest Pain (Nonspecific) °It is often hard to give a specific diagnosis for the cause of chest pain. There is always a chance that your pain could be related to something serious, such as a heart attack or a blood clot in the lungs. You need to follow up with your caregiver for further evaluation. °CAUSES  °· Heartburn. °· Pneumonia or bronchitis. °· Anxiety or stress. °· Inflammation around your heart (pericarditis) or lung (pleuritis or pleurisy). °· A blood clot in the lung. °· A collapsed lung (pneumothorax). It can develop suddenly on its own (spontaneous pneumothorax) or from injury (trauma) to the chest. °· Shingles infection (herpes zoster virus). °The chest wall is composed of bones, muscles, and cartilage. Any of these can be the source of the pain. °· The bones can be bruised by injury. °· The muscles or cartilage can be strained by coughing or overwork. °· The cartilage can be affected by inflammation and become sore (costochondritis). °DIAGNOSIS  °Lab tests or other studies, such as X-rays, electrocardiography, stress testing, or cardiac imaging, may be needed to find the cause of your pain.  °TREATMENT  °· Treatment depends on what may be causing your chest pain. Treatment may include: °· Acid blockers for heartburn. °· Anti-inflammatory medicine. °· Pain medicine for inflammatory conditions. °· Antibiotics if an infection is present. °· You may be advised to change lifestyle habits. This includes stopping smoking and avoiding alcohol, caffeine, and chocolate. °· You may be advised to keep your head raised (elevated) when sleeping. This reduces the chance of acid going backward from your stomach into your esophagus. °· Most of the time, nonspecific chest pain will improve within 2 to 3 days with rest and mild pain medicine. °HOME CARE INSTRUCTIONS  °· If antibiotics were prescribed, take your antibiotics as directed. Finish them even if you start to feel better. °· For the next few days, avoid physical  activities that bring on chest pain. Continue physical activities as directed. °· Do not smoke. °· Avoid drinking alcohol. °· Only take over-the-counter or prescription medicine for pain, discomfort, or fever as directed by your caregiver. °· Follow your caregiver's suggestions for further testing if your chest pain does not go away. °· Keep any follow-up appointments you made. If you do not go to an appointment, you could develop lasting (chronic) problems with pain. If there is any problem keeping an appointment, you must call to reschedule. °SEEK MEDICAL CARE IF:  °· You think you are having problems from the medicine you are taking. Read your medicine instructions carefully. °· Your chest pain does not go away, even after treatment. °· You develop a rash with blisters on your chest. °SEEK IMMEDIATE MEDICAL CARE IF:  °· You have increased chest pain or pain that spreads to your arm, neck, jaw, back, or abdomen. °· You develop shortness of breath, an increasing cough, or you are coughing up blood. °· You have severe back or abdominal pain, feel nauseous, or vomit. °· You develop severe weakness, fainting, or chills. °· You have a fever. °THIS IS AN EMERGENCY. Do not wait to see if the pain will go away. Get medical help at once. Call your local emergency services (911 in [place] Do not drive yourself to the hospital. °MAKE SURE YOU:  °· Understand these instructions. °· Will watch your condition. °· Will get help right away if you are not doing well or get worse. °Document Released: 06/06/2005 Document Revised: 11/19/2011 Document Reviewed: 04/01/2008 °ExitCare® Patient Information ©2014 [employer]   LL[employer]. ° °

## 2013-12-25 NOTE — Clinical Social Work Psychosocial (Signed)
Clinical Social Work Department  BRIEF PSYCHOSOCIAL ASSESSMENT  Patient:[name] Account Number: [id number]  Admit date:12/23/13  Clinical Social Worker Rhea Pink, MSW Date/Time: 12/25/2013 1:30 PM Referred by: Physician Date Referred:  Referred for   SNF Placement   Other Referral:  Interview type: Patient  Other interview type: PSYCHOSOCIAL DATA  Living Status: alone Admitted from facility:  Level of care:  Primary support name: Burlene Arnt Primary support relationship to patient: Friend Degree of support available:  Fair  CURRENT CONCERNS  Current Concerns   Post-Acute Placement   Other Concerns:  SOCIAL WORK ASSESSMENT / PLAN  [name] met with pt at bedside to offer support and discuss SNF placement. Patient reported that he spoke with [name] and knows that he needs help at a SNF. Patient appeared depressed and reported that he has trouble now getting around his house. [name] comforted patient and informed him that the process for placement would be started. Patient was very Patent attorney. [name] provided patient with a [facility] list.   Pt lives alone  [name] explained placement process and answered questions.   Pt reports no prefeerence at this time     [name] completed FL2 and initiated SNF search.     Assessment/plan status: Information/Referral to [employer]  Other assessment/ plan:  Information/referral to community resources:  SNF   PTAR  PATIENT'S/FAMILY'S RESPONSE TO PLAN OF CARE:  Pt  reports he is agreeable to ST SNF in order to increase strength and independence with mobility prior to returning home  Pt verbalized understanding of placement process and was very appreciative of support and information provided by [name].   Rhea Pink, MSW, [place]

## 2013-12-25 NOTE — Clinical Social Work Placement (Addendum)
Clinical Social Work Department  CLINICAL SOCIAL WORK PLACEMENT NOTE    Patient:[name]  Account Number: [id number] Admit date: 12/23/13 Clinical Social Worker: Rhea Pink LCSWA Date/time: 12/25/2013 11:30 AM  Clinical Social Work is seeking post-discharge placement for this patient at the following level of care: SKILLED NURSING (*[name] will update this form in Epic as items are completed)   12/25/2013  Patient/family provided with [place] Department of Clinical Social Work's list of facilities offering this level of care within the geographic area requested by the patient (or if unable, by the patient's family).   12/25/2013  Patient/family informed of their freedom to choose among providers that offer the needed level of care, that participate in Medicare, Medicaid or managed care program needed by the patient, have an available bed and are willing to accept the patient.   12/25/2013  Patient/family informed of [facility]' ownership interest in [facility], as well as of the fact that they are under no obligation to receive care at this facility.  PASARR submitted to EDS on    PASARR number received from [employer] on  FL2 transmitted to all facilities in geographic area requested by pt/family on  12/25/2013  FL2 transmitted to all facilities within larger geographic area on  Patient informed that his/her managed care company has contracts with or will negotiate with certain facilities, including the following:  Patient/family informed of bed offers received:   Patient chooses bed at  Physician recommends and patient chooses bed at  Patient to be transferred to on 12/26/13 Patient to be transferred to facility by EMS (PTAR) 12/26/13- [place], [place] The following physician request were entered in Epic:  Additional Comments:

## 2013-12-25 NOTE — Progress Notes (Signed)
Inflated balloon in chronic foley to full 15cc, per MD and pt request. Pt states he needs it inflated to 15cc so that it feels more comfortable.

## 2013-12-25 NOTE — Progress Notes (Signed)
NEURO HOSPITALIST PROGRESS NOTE   SUBJECTIVE:                                                                                                                         Still has slight left sided facial numbness.  HA is very frustrated because he thought he was going to go to Rehab and feels people have told him he is not going.  He refused PT although he knows they are the people who evaluate him for CIR. States he cannot make it to any of his out patient visits.   OBJECTIVE:                                                                                                                           Vital signs in last 24 hours: Temp:  [97.5 F (36.4 C)-98.5 F (36.9 C)] 97.5 F (36.4 C) (04/17 1341) Pulse Rate:  [66-88] 66 (04/17 1341) Resp:  [14-18] 14 (04/17 1341) BP: (114-136)/(48-69) 121/69 mmHg (04/17 1341) SpO2:  [98 %] 98 % (04/17 1341) Weight:  [168.647 kg (371 lb 12.8 oz)] 168.647 kg (371 lb 12.8 oz) (04/17 0619)  Intake/Output from previous day: 04/16 0701 - 04/17 0700 In: 360 [P.O.:360] Out: 2000 [Urine:2000] Intake/Output this shift:   Nutritional status: Carb Control  Past Medical History  Diagnosis Date  . Obese   . Hypertension   . Diabetes mellitus   . Diabetic diarrhea   . Sleep apnea   . Diabetic neuropathy, painful   . Chronic indwelling Foley catheter   . Bladder spasms   . Complication of anesthesia   . PONV (postoperative nausea and vomiting)   . Hyperlipidemia   . GERD (gastroesophageal reflux disease)   . Neurogenic bladder   . Stroke 2013     Neurologic Exam:  Mental Status:  Alert, oriented, thought content appropriate. Speech fluent without evidence of aphasia. Able to follow 3 step commands without difficulty.  Cranial Nerves:  II: Visual fields grossly normal, pupils equal, round, reactive to light and accommodation  III,IV, VI: ptosis not present, extra-ocular motions intact bilaterally  V,VII: smile  symmetric symmetric and  sensation stated to be decreased on the left --no longer splitting midline and states the decreased  sensation has improved.  VIII: hearing normal bilaterally  IX,X: gag reflex present  XI: bilateral shoulder shrug  XII: midline tongue extension without atrophy or fasciculations  Motor:  5/5 throughout  Sensory: Pinprick and light touch intact throughout, bilaterally --decreased in LE from mid calf to foot due to PVD  Deep Tendon Reflexes:  2+ bilateral UE and no KJ or AJ  Plantars:  Mute bilaterally  Cerebellar:  normal finger-to-nose, Unable to take part in heel-to-shin test due to body habitus    Lab Results: Basic Metabolic Panel:  Recent Labs Lab 12/23/13 0936 12/23/13 1510 12/24/13 0129 12/25/13 0526  NA 136*  --  135* 134*  K 4.1  --  4.5 3.9  CL 94*  --  97 96  CO2 26  --  26 25  GLUCOSE 218*  --  223* 194*  BUN 9  --  13 14  CREATININE 0.63 0.60 0.76 0.63  CALCIUM 8.7  --  8.8 8.3*    Liver Function Tests:  Recent Labs Lab 12/24/13 0129  AST 12  ALT 9  ALKPHOS 82  BILITOT 0.9  PROT 5.9*  ALBUMIN 2.9*   No results found for this basename: LIPASE, AMYLASE,  in the last 168 hours No results found for this basename: AMMONIA,  in the last 168 hours  CBC:  Recent Labs Lab 12/23/13 0936 12/23/13 1510  WBC 8.5 8.1  HGB 15.0 15.6  HCT 43.5 44.9  MCV 89.3 89.4  PLT 226 208    Cardiac Enzymes:  Recent Labs Lab 12/23/13 1510 12/23/13 1956 12/24/13 0128  TROPONINI <0.30 <0.30 <0.30    Lipid Panel: No results found for this basename: CHOL, TRIG, HDL, CHOLHDL, VLDL, LDLCALC,  in the last 168 hours  CBG:  Recent Labs Lab 12/24/13 1153 12/24/13 1727 12/24/13 2120 12/25/13 0921 12/25/13 1127  GLUCAP 224* 259* 199* 173* 169*    Microbiology: Results for orders placed during the hospital encounter of 12/23/13  URINE CULTURE     Status: None   Collection Time    12/23/13  3:20 PM      Result Value Ref Range  Status   Specimen Description URINE, CATHETERIZED   Final   Special Requests NONE   Final   Culture  Setup Time     Final   Value: 12/23/2013 21:00     Performed at [facility]     Final   Value: >=100,000 COLONIES/ML     Performed at [facility]   Culture     Final   Value: Multiple bacterial morphotypes present, none predominant. Suggest appropriate recollection if clinically indicated.     Performed at [facility]   Report Status 12/24/2013 FINAL   Final    Coagulation Studies: No results found for this basename: LABPROT, INR,  in the last 72 hours  Imaging: Mr Virgel Paling Wo Contrast  12/25/2013   CLINICAL DATA:  Left-sided facial numbness. Stroke. The examination had to be discontinued prior to completion due to patient refusal of further imaging, including the typical coronal T2 sequence.  EXAM: MRI HEAD WITHOUT CONTRAST  MRA HEAD WITHOUT CONTRAST  TECHNIQUE: Multiplanar, multiecho pulse sequences of the brain and surrounding structures were obtained without intravenous contrast. Angiographic images of the head were obtained using MRA technique without contrast.  COMPARISON:  Multiple prior CTs the head, most recently 12/23/2013.  FINDINGS: MRI HEAD FINDINGS  The diffusion-weighted images demonstrate no evidence for acute or subacute  infarction. Calcium or less likely heavy metal deposits are again seen in the basal ganglia bilaterally. No acute infarct, hemorrhage, or mass lesion is present. Mild generalized atrophy is advanced for age. The ventricles are proportionate to the degree of atrophy. No significant extraaxial fluid collection is present.  Flow is present in the major intracranial arteries. The globes and orbits are intact. The paranasal sinuses and mastoid air cells are clear.  MRA HEAD FINDINGS  The internal carotid arteries are within normal limits from the high cervical segments through the ICA termini bilaterally. There is minimal  bulging at the posterior communicating artery origins bilaterally. These could represent small aneurysms, measuring 2 mm on the right and 1.5 mm on the left, these likely represent infundibula. A faint posterior communicating artery is suspected on the right. The MCA bifurcations are within normal limits. ACA and MCA branch vessels are within normal limits.  The left posterior cerebral artery is the dominant vessel. The left PICA is seen distally but the origin is not visualized. The right PICA is not visualized. A prominent right AICA is noted. The basilar artery is within normal limits. Both posterior cerebral arteries originate from the basilar tip. Moderate focal stenosis is present in the proximal left P2 segment.  IMPRESSION: 1. Mild generalized atrophy. 2. No acute intracranial abnormality. 3. Minimal bulging at the expected origins of the posterior communicating arteries bilaterally. While this likely represents small infundibula, definitive posterior communicating arteries are not seen and tiny aneurysms are not excluded. 4. Signal loss in the proximal left PICA suggesting a significant stenosis. 5. Focal moderate stenosis of the proximal left P2 segment compared to the more is a distal vessels. 6. Mild small vessel disease, most notably in the posterior circulation.   Electronically Signed   By: Lawrence Santiago M.D.   On: 12/25/2013 09:30   Mr Brain Wo Contrast  12/25/2013   CLINICAL DATA:  Left-sided facial numbness. Stroke. The examination had to be discontinued prior to completion due to patient refusal of further imaging, including the typical coronal T2 sequence.  EXAM: MRI HEAD WITHOUT CONTRAST  MRA HEAD WITHOUT CONTRAST  TECHNIQUE: Multiplanar, multiecho pulse sequences of the brain and surrounding structures were obtained without intravenous contrast. Angiographic images of the head were obtained using MRA technique without contrast.  COMPARISON:  Multiple prior CTs the head, most recently  12/23/2013.  FINDINGS: MRI HEAD FINDINGS  The diffusion-weighted images demonstrate no evidence for acute or subacute infarction. Calcium or less likely heavy metal deposits are again seen in the basal ganglia bilaterally. No acute infarct, hemorrhage, or mass lesion is present. Mild generalized atrophy is advanced for age. The ventricles are proportionate to the degree of atrophy. No significant extraaxial fluid collection is present.  Flow is present in the major intracranial arteries. The globes and orbits are intact. The paranasal sinuses and mastoid air cells are clear.  MRA HEAD FINDINGS  The internal carotid arteries are within normal limits from the high cervical segments through the ICA termini bilaterally. There is minimal bulging at the posterior communicating artery origins bilaterally. These could represent small aneurysms, measuring 2 mm on the right and 1.5 mm on the left, these likely represent infundibula. A faint posterior communicating artery is suspected on the right. The MCA bifurcations are within normal limits. ACA and MCA branch vessels are within normal limits.  The left posterior cerebral artery is the dominant vessel. The left PICA is seen distally but the origin is not visualized. The right PICA  is not visualized. A prominent right AICA is noted. The basilar artery is within normal limits. Both posterior cerebral arteries originate from the basilar tip. Moderate focal stenosis is present in the proximal left P2 segment.  IMPRESSION: 1. Mild generalized atrophy. 2. No acute intracranial abnormality. 3. Minimal bulging at the expected origins of the posterior communicating arteries bilaterally. While this likely represents small infundibula, definitive posterior communicating arteries are not seen and tiny aneurysms are not excluded. 4. Signal loss in the proximal left PICA suggesting a significant stenosis. 5. Focal moderate stenosis of the proximal left P2 segment compared to the more is  a distal vessels. 6. Mild small vessel disease, most notably in the posterior circulation.   Electronically Signed   By: Lawrence Santiago M.D.   On: 12/25/2013 09:30       MEDICATIONS                                                                                                                        Scheduled: . aspirin EC  81 mg Oral q morning - 10a  . enoxaparin (LOVENOX) injection  80 mg Subcutaneous Q24H  . furosemide  40 mg Oral Daily  . insulin aspart  0-20 Units Subcutaneous TID WC  . insulin aspart  10 Units Subcutaneous TID WC  . insulin detemir  50 Units Subcutaneous QHS  . metoprolol tartrate  50 mg Oral BID  . ondansetron (ZOFRAN) IV  4 mg Intravenous Once  . pantoprazole  40 mg Oral Daily  . regadenoson  0.4 mg Intravenous Once  . sodium chloride  3 mL Intravenous Q12H  . sodium chloride  3 mL Intravenous Q12H    ASSESSMENT/PLAN:                                                                                                            68 YO male with subjective left facial numbness which is improving and resolved left facial droop. MRI brain normal with no acute infarct.  MRA brain does show a minimal bulging at the posterior communicating artery origins bilaterally. These could represent small aneurysms, measuring 2 mm on the right and 1.5 mm on the left, these likely represent infundibula. The se finding can be followed by repeat MRA in 6 months as a out patient.   No further inpatient neurological diagnostic testing recommended.    Assessment and plan discussed with with attending physician and they are in agreement.    Etta Quill PA-C [facility] [phone]  12/25/2013, 3:07 PM

## 2013-12-25 NOTE — Progress Notes (Signed)
PT Cancellation Note  Patient Details Name: [name] MRN: [id number] DOB: Oct 02, 1945   Cancelled Treatment:    Reason Eval/Treat Not Completed: Patient declined, no reason specified.  Pt very frustrated at this time.  He was set on the idea of going to rehab and has been recently told that he does not qualify to go to rehab.  He is frustrated.  He wants to go home.  I asked him to get up with me so that we could assess his need for home services, keep him moving to stay stronger and he reported,"you are going to bill me for this conversation, aren't you?"  I told him that we had not done anything yet and I could not bill for doing nothing with him, that would be fraud.  He continued to be very upset with me, he did not want to participate.  I tried to plead with him to accept [facility] if he did not qualify for SNF placement and pt reported, "I am just giving up.  I don't want any of your help.  I don't want any home services.  They are a joke."  PT recommending [facility] HHPT/HHOT, aide and RN if he qualifies.  Maybe after he has calmed down he will be more agreeable to help at home.    Thanks,    Barbarann Ehlers. [place], [place], DPT [phone]   12/25/2013, 2:56 PM

## 2013-12-25 NOTE — Progress Notes (Signed)
Inpatient Diabetes Program Recommendations  AACE/ADA: New Consensus Statement on Inpatient Glycemic Control (2013)  Target Ranges:  Prepandial:   less than 140 mg/dL      Peak postprandial:   less than 180 mg/dL (1-2 hours)      Critically ill patients:  140 - 180 mg/dL   Reason for Visit: Hyperglycemia  Current orders for Inpatient glycemic control: Levemir 50 units QHS, Novolog 10 units tidwc and resistant correction tidwc.  Post-prandial blood sugars high. FBS improving. Concern for potential hypoglycemia with large increase in basal insulin.  Needs more meal-coverage insulin.  Inpatient Diabetes Program Recommendations Insulin - Basal: Decrease Levemir to 40 units QHS Correction (SSI): Add HS correction Insulin - Meal Coverage: Increase Novolog to 15 units tidwc for meal coverage insulin  Note: Will continue to follow. Thank you. Lorenda Peck, RD, LDN, CDE Inpatient Diabetes Coordinator [phone]

## 2013-12-25 NOTE — Progress Notes (Signed)
Spoke with patient about CPAP. He said he does not wish to wear a CPAP tonight. I gave him instruction to let his nurse know to notify respiratory if he changed his mind.

## 2013-12-25 NOTE — Discharge Summary (Addendum)
Patient ID: [name],  MRN: [id number], DOB/AGE: [birth year] 68 y.o.  Admit date: 12/23/2013 Discharge date: 12/25/2013  Primary Care Provider: Irven Shelling, MD Primary Cardiologist: Dr Marlou Porch  Discharge Diagnoses Principal Problem:   Syncope and collapse Active Problems:   Chest pain with moderate risk of acute coronary syndrome   DM (diabetes mellitus) with complications   CAD- known RCA disease- medical Rx   Obesity- BMI 49   Physical deconditioning   Polyneuropathy in diabetes(357.2)   OSA (obstructive sleep apnea)   Hypertension   Mental status change   Chronic indwelling Foley catheter     Hospital Course:  68 y.o. male with a history of morbid obesity, hypertension, diabetes mellitus, chronic indwelling foley catheter d/t neurogenic bladder, OSA, hyperlipidemia, GERD, CVA in 2013 and coronary artery disease s/p LHC in 2013 90% stenosis of RCA treated medically. Pt presented to the [facility] ED 12/23/13 after a syncopal episode. Orthostatic vital signs were normal. A trip to hi family doctor caused him significant SOB, exhaustion and led to an episode of syncope. He was admitted for further evaluation and we saw him on consult. Troponin were negative x 4. He had no arrythmia. Pt was too heavy for Myoview at [facility] and we did not feel a cath was warranted. He is discharged 4/17 with plans for an OP Myoview in the office (higher table wgt limit), and follow up with Dr Marlou Porch. The patient was seen in consultation by neurology, they ordered an MRI of the brain which showed no acute stroke or hemorrhage.    Discharge Vitals:  Blood pressure 121/69, pulse 66, temperature 97.5 F (36.4 C), temperature source Oral, resp. rate 14, height 6' 1"  (1.854 m), weight 371 lb 12.8 oz (168.647 kg), SpO2 98.00%.    Labs: Results for orders placed during the hospital encounter of 12/23/13 (from the past 48 hour(s))  CBC     Status: None   Collection Time    12/23/13  3:10 PM   Result Value Ref Range   WBC 8.1  4.0 - 10.5 K/uL   RBC 5.02  4.22 - 5.81 MIL/uL   Hemoglobin 15.6  13.0 - 17.0 g/dL   HCT 44.9  39.0 - 52.0 %   MCV 89.4  78.0 - 100.0 fL   MCH 31.1  26.0 - 34.0 pg   MCHC 34.7  30.0 - 36.0 g/dL   RDW 13.1  11.5 - 15.5 %   Platelets 208  150 - 400 K/uL  CREATININE, SERUM     Status: None   Collection Time    12/23/13  3:10 PM      Result Value Ref Range   Creatinine, Ser 0.60  0.50 - 1.35 mg/dL   GFR calc non Af Amer >90  >90 mL/min   GFR calc Af Amer >90  >90 mL/min   Comment: (NOTE)     The eGFR has been calculated using the CKD EPI equation.     This calculation has not been validated in all clinical situations.     eGFR's persistently <90 mL/min signify possible Chronic Kidney     Disease.  TROPONIN I     Status: None   Collection Time    12/23/13  3:10 PM      Result Value Ref Range   Troponin I <0.30  <0.30 ng/mL   Comment:            Due to the release kinetics of cTnI,  a negative result within the first hours     of the onset of symptoms does not rule out     myocardial infarction with certainty.     If myocardial infarction is still suspected,     repeat the test at appropriate intervals.  HEMOGLOBIN A1C     Status: Abnormal   Collection Time    12/23/13  3:10 PM      Result Value Ref Range   Hemoglobin A1C 11.2 (*) <5.7 %   Comment: (NOTE)                                                                               According to the ADA Clinical Practice Recommendations for 2011, when     HbA1c is used as a screening test:      >=6.5%   Diagnostic of Diabetes Mellitus               (if abnormal result is confirmed)     5.7-6.4%   Increased risk of developing Diabetes Mellitus     References:Diagnosis and Classification of Diabetes Mellitus,Diabetes     CZYS,0630,16(WFUXN 1):S62-S69 and Standards of Medical Care in             Diabetes - 2011,Diabetes Care,2011,34 (Suppl 1):S11-S61.   Mean Plasma Glucose 275 (*) <117 mg/dL    Comment: Performed at [facility], ROUTINE W REFLEX MICROSCOPIC     Status: Abnormal   Collection Time    12/23/13  3:20 PM      Result Value Ref Range   Color, Urine YELLOW  YELLOW   APPearance CLEAR  CLEAR   Specific Gravity, Urine 1.004 (*) 1.005 - 1.030   pH 7.0  5.0 - 8.0   Glucose, UA NEGATIVE  NEGATIVE mg/dL   Hgb urine dipstick SMALL (*) NEGATIVE   Bilirubin Urine NEGATIVE  NEGATIVE   Ketones, ur NEGATIVE  NEGATIVE mg/dL   Protein, ur NEGATIVE  NEGATIVE mg/dL   Urobilinogen, UA 0.2  0.0 - 1.0 mg/dL   Nitrite NEGATIVE  NEGATIVE   Leukocytes, UA LARGE (*) NEGATIVE  URINE CULTURE     Status: None   Collection Time    12/23/13  3:20 PM      Result Value Ref Range   Specimen Description URINE, CATHETERIZED     Special Requests NONE     Culture  Setup Time       Value: 12/23/2013 21:00     Performed at [place]       Value: >=100,000 COLONIES/ML     Performed at [facility]   Culture       Value: Multiple bacterial morphotypes present, none predominant. Suggest appropriate recollection if clinically indicated.     Performed at [facility]   Report Status 12/24/2013 FINAL    URINE MICROSCOPIC-ADD ON     Status: Abnormal   Collection Time    12/23/13  3:20 PM      Result Value Ref Range   Squamous Epithelial / LPF RARE  RARE   WBC, UA 21-50  <3 WBC/hpf   RBC / HPF 0-2  <3 RBC/hpf  Bacteria, UA FEW (*) RARE  GLUCOSE, CAPILLARY     Status: Abnormal   Collection Time    12/23/13  5:04 PM      Result Value Ref Range   Glucose-Capillary 301 (*) 70 - 99 mg/dL  TROPONIN I     Status: None   Collection Time    12/23/13  7:56 PM      Result Value Ref Range   Troponin I <0.30  <0.30 ng/mL   Comment:            Due to the release kinetics of cTnI,     a negative result within the first hours     of the onset of symptoms does not rule out     myocardial infarction with certainty.     If myocardial  infarction is still suspected,     repeat the test at appropriate intervals.  GLUCOSE, CAPILLARY     Status: Abnormal   Collection Time    12/23/13  9:26 PM      Result Value Ref Range   Glucose-Capillary 193 (*) 70 - 99 mg/dL  TROPONIN I     Status: None   Collection Time    12/24/13  1:28 AM      Result Value Ref Range   Troponin I <0.30  <0.30 ng/mL   Comment:            Due to the release kinetics of cTnI,     a negative result within the first hours     of the onset of symptoms does not rule out     myocardial infarction with certainty.     If myocardial infarction is still suspected,     repeat the test at appropriate intervals.  COMPREHENSIVE METABOLIC PANEL     Status: Abnormal   Collection Time    12/24/13  1:29 AM      Result Value Ref Range   Sodium 135 (*) 137 - 147 mEq/L   Potassium 4.5  3.7 - 5.3 mEq/L   Chloride 97  96 - 112 mEq/L   CO2 26  19 - 32 mEq/L   Glucose, Bld 223 (*) 70 - 99 mg/dL   BUN 13  6 - 23 mg/dL   Creatinine, Ser 0.76  0.50 - 1.35 mg/dL   Calcium 8.8  8.4 - 10.5 mg/dL   Total Protein 5.9 (*) 6.0 - 8.3 g/dL   Albumin 2.9 (*) 3.5 - 5.2 g/dL   AST 12  0 - 37 U/L   ALT 9  0 - 53 U/L   Alkaline Phosphatase 82  39 - 117 U/L   Total Bilirubin 0.9  0.3 - 1.2 mg/dL   GFR calc non Af Amer >90  >90 mL/min   GFR calc Af Amer >90  >90 mL/min   Comment: (NOTE)     The eGFR has been calculated using the CKD EPI equation.     This calculation has not been validated in all clinical situations.     eGFR's persistently <90 mL/min signify possible Chronic Kidney     Disease.  GLUCOSE, CAPILLARY     Status: Abnormal   Collection Time    12/24/13  7:50 AM      Result Value Ref Range   Glucose-Capillary 205 (*) 70 - 99 mg/dL  GLUCOSE, CAPILLARY     Status: Abnormal   Collection Time    12/24/13 11:53 AM      Result Value Ref Range  Glucose-Capillary 224 (*) 70 - 99 mg/dL  GLUCOSE, CAPILLARY     Status: Abnormal   Collection Time    12/24/13  5:27 PM        Result Value Ref Range   Glucose-Capillary 259 (*) 70 - 99 mg/dL  GLUCOSE, CAPILLARY     Status: Abnormal   Collection Time    12/24/13  9:20 PM      Result Value Ref Range   Glucose-Capillary 199 (*) 70 - 99 mg/dL  BASIC METABOLIC PANEL     Status: Abnormal   Collection Time    12/25/13  5:26 AM      Result Value Ref Range   Sodium 134 (*) 137 - 147 mEq/L   Potassium 3.9  3.7 - 5.3 mEq/L   Chloride 96  96 - 112 mEq/L   CO2 25  19 - 32 mEq/L   Glucose, Bld 194 (*) 70 - 99 mg/dL   BUN 14  6 - 23 mg/dL   Creatinine, Ser 0.63  0.50 - 1.35 mg/dL   Calcium 8.3 (*) 8.4 - 10.5 mg/dL   GFR calc non Af Amer >90  >90 mL/min   GFR calc Af Amer >90  >90 mL/min   Comment: (NOTE)     The eGFR has been calculated using the CKD EPI equation.     This calculation has not been validated in all clinical situations.     eGFR's persistently <90 mL/min signify possible Chronic Kidney     Disease.  GLUCOSE, CAPILLARY     Status: Abnormal   Collection Time    12/25/13  9:21 AM      Result Value Ref Range   Glucose-Capillary 173 (*) 70 - 99 mg/dL  GLUCOSE, CAPILLARY     Status: Abnormal   Collection Time    12/25/13 11:27 AM      Result Value Ref Range   Glucose-Capillary 169 (*) 70 - 99 mg/dL    Disposition:  Follow-up Information   Follow up with Candee Furbish, MD On 01/20/2014. (10:45)    Specialty:  Cardiology   Contact information:   [address] [address] [phone]       Discharge Medications:    Medication List         acetaminophen 325 MG tablet  Commonly known as:  TYLENOL  Take 2 tablets (650 mg total) by mouth every 6 (six) hours as needed for mild pain (or Fever >/= 101).     aspirin EC 81 MG tablet  Take 81 mg by mouth every morning.     cyclobenzaprine 10 MG tablet  Commonly known as:  FLEXERIL  Take 1 tablet (10 mg total) by mouth 2 (two) times daily as needed for muscle spasms.     diphenhydrAMINE 25 mg capsule  Commonly  known as:  BENADRYL  Take 25 mg by mouth every 6 (six) hours as needed. For allergy.     diphenoxylate-atropine 2.5-0.025 MG per tablet  Commonly known as:  LOMOTIL  Take 1 tablet by mouth 4 (four) times daily as needed. Diarrhea     furosemide 40 MG tablet  Commonly known as:  LASIX  Take 40 mg by mouth daily.     HYDROcodone-acetaminophen 5-325 MG per tablet  Commonly known as:  NORCO/VICODIN  Take 1 tablet by mouth every 4 (four) hours as needed for severe pain.     ibuprofen 200 MG tablet  Commonly known as:  ADVIL,MOTRIN  Take  800 mg by mouth every 6 (six) hours as needed for mild pain or moderate pain.     insulin NPH-regular Human (70-30) 100 UNIT/ML injection  Commonly known as:  NOVOLIN 70/30  Inject 50-60 Units into the skin 2 (two) times daily with a meal.     lisinopril 10 MG tablet  Commonly known as:  PRINIVIL,ZESTRIL  Take 10 mg by mouth daily.     loperamide 2 MG tablet  Commonly known as:  IMODIUM A-D  Take 2 mg by mouth 4 (four) times daily as needed for diarrhea or loose stools.     metoprolol 50 MG tablet  Commonly known as:  LOPRESSOR  Take 100 mg by mouth daily.     nitroGLYCERIN 0.4 MG SL tablet  Commonly known as:  NITROSTAT  Place 0.4 mg under the tongue every 5 (five) minutes as needed for chest pain.     omeprazole 20 MG capsule  Commonly known as:  PRILOSEC  Take 20 mg by mouth daily.     oxybutynin 5 MG tablet  Commonly known as:  DITROPAN  Take 5 mg by mouth 3 (three) times daily as needed for bladder spasms.         Duration of Discharge Encounter: Greater than 30 minutes including physician time.  Angelena Form PA-C 12/25/2013 1:51 PM

## 2013-12-25 NOTE — Progress Notes (Signed)
Subjective: L facial numbness better, chest pressure.  Some bladder discomfort  Objective: Vital signs in last 24 hours: Temp:  [97.6 F (36.4 C)-98.5 F (36.9 C)] 98.2 F (36.8 C) (04/17 0355) Pulse Rate:  [56-88] 74 (04/17 0355) Resp:  [18] 18 (04/17 0355) BP: (101-136)/(48-68) 136/55 mmHg (04/17 0355) SpO2:  [98 %-99 %] 98 % (04/17 0355) Weight:  [168.647 kg (371 lb 12.8 oz)] 168.647 kg (371 lb 12.8 oz) (04/17 0619) Weight change: -2.132 kg (-4 lb 11.2 oz) Last BM Date: 12/24/13  Intake/Output from previous day: 04/16 0701 - 04/17 0700 In: 360 [P.O.:360] Out: 2000 [Urine:2000] Intake/Output this shift: Total I/O In: 360 [P.O.:360] Out: 1800 [Urine:1800]  General appearance: alert and cooperative Resp: clear to auscultation bilaterally Cardio: regular rate and rhythm, S1, S2 normal, no murmur, click, rub or gallop Extremities: extremities normal, atraumatic, no cyanosis or edema  Lab Results:  Recent Labs  12/23/13 0936 12/23/13 1510  WBC 8.5 8.1  HGB 15.0 15.6  HCT 43.5 44.9  PLT 226 208   BMET  Recent Labs  12/24/13 0129 12/25/13 0526  NA 135* 134*  K 4.5 3.9  CL 97 96  CO2 26 25  GLUCOSE 223* 194*  BUN 13 14  CREATININE 0.76 0.63  CALCIUM 8.8 8.3*    Studies/Results: Ct Head Wo Contrast  12/23/2013   CLINICAL DATA:  Altered level of consciousness, substernal chest pain radiating to left arm, hypertension, diabetes, prior stroke  EXAM: CT HEAD WITHOUT CONTRAST  TECHNIQUE: Contiguous axial images were obtained from the base of the skull through the vertex without intravenous contrast.  COMPARISON:  10/25/2012  FINDINGS: Generalized atrophy.  Normal ventricular morphology.  No midline shift or mass effect.  Stable benign-appearing basal ganglia and thalamic calcifications.  No intracranial hemorrhage, mass lesion, or evidence acute infarction.  No extra-axial fluid collections.  Bones and sinuses unremarkable.  IMPRESSION: No acute intracranial  abnormalities.  No significant interval change.   Electronically Signed   By: Lavonia Dana M.D.   On: 12/23/2013 10:12   Dg Chest Port 1 View  12/23/2013   CLINICAL DATA:  Chest pain and dyspnea  EXAM: PORTABLE CHEST - 1 VIEW  COMPARISON:  DG CHEST 2 VIEW dated 10/23/2012  FINDINGS: The lungs are well-expanded. The interstitial markings are mildly increased but this is not clearly new. The cardiopericardial silhouette is mildly enlarged. The central pulmonary vascularity is prominent. There is mild tortuosity of the descending thoracic aorta. The mediastinum is normal in width. There is no pleural effusion or pneumothorax.  IMPRESSION: There is no evidence of pneumonia. There remains enlargement of the cardiac silhouette and prominence of the central pulmonary vascularity. This coupled the mildly increased interstitial markings likely reflects low grade CHF.   Electronically Signed   By: David  Martinique   On: 12/23/2013 09:26    Medications: I have reviewed the patient's current medications.  Assessment/Plan: Principal Problem:  Syncope suspect this was vasovagal vs secondary CAD. CT head and echo unremarkable. Check orthostatics. Telemetry negative Active Problems:  DM (diabetes mellitus) with complications poorly controlled, increase lantus and continue SSI/premeal novolog  OSA (obstructive sleep apnea) has never agreed to treatment  Hypertension BP was low, holding lisinopril  CAD (coronary artery disease) cardiology input noted, nuclear study today. Continue ASA, nitrate, beta blocker. L facial numbness, he claims this is new. Neurology input note, mri/mra today Chronic indwelling Foley catheter  Disposition PT input noted, plan is for [facility] for rehab. He has tenuous home situation  living on his own. SW consult.    LOS: 2 days   Irven Shelling 12/25/2013, 7:00 AM

## 2013-12-25 NOTE — Evaluation (Signed)
Occupational Therapy Evaluation Patient Details Name: [name] MRN: [id number] DOB: 1946/06/11 Today's Date: 12/25/2013    History of Present Illness 68 y.o. male, who presented to the emergency room with a syncopal episode.    Clinical Impression   Pt admitted with the above diagnosis and has the deficits listed below.  Pt would benefit from cont OT to increase I with basic adls to S to mod I level so eventually he can live alone at home and feel safe and confident about his adl abilities.    Follow Up Recommendations  SNF    Equipment Recommendations  Tub/shower bench    Recommendations for Other Services       Precautions / Restrictions Precautions Precautions: Fall Precaution Comments: reports to OT that he did NOT fall but hurt his back while walking in January and EMS was called but there was no fall.  Many inconsistencies in stories. Restrictions Weight Bearing Restrictions: No      Mobility Bed Mobility Overal bed mobility: Modified Independent             General bed mobility comments: supine to sit effortful for pt; requires HOB elevated and handrails  Transfers Overall transfer level: Needs assistance Equipment used: Rolling walker (2 wheeled) Transfers: Sit to/from [facility] Sit to Stand: Min guard;From elevated surface Stand pivot transfers: Min assist       General transfer comment: min guard to steady with cues for hand placement and sequencing; pt utilizes rocking technique and relies heavily on UEs sto shift weight anteriorly onto feet; cues for sequencing and hand placement with RW    Balance Overall balance assessment: Needs assistance;History of Falls Sitting-balance support: Feet supported Sitting balance-Leahy Scale: Good     Standing balance support: Bilateral upper extremity supported;During functional activity Standing balance-Leahy Scale: Poor Standing balance comment: Pt has to have something to support him  to stand.  Not sure, at this point, the cane would be enought and that is what he is accustomed to using at home.                            ADL Overall ADL's : Needs assistance/impaired Eating/Feeding: Independent;Sitting   Grooming: Wash/dry hands;Wash/dry face;Oral care;Brushing hair;Set up;Sitting Grooming Details (indicate cue type and reason): pt too fatigred to walk to sink.  C/o hip hurting from being in bed.  Pt sleeps in recliner at home. Upper Body Bathing: Set up;Sitting   Lower Body Bathing: Moderate assistance;Sit to/from stand Lower Body Bathing Details (indicate cue type and reason): Pt had great difficulty reaching lower legs.  States he often time just bathes what he can reach.  Feet and lower legs have been neglected. Upper Body Dressing : Set up;Sitting   Lower Body Dressing: Moderate assistance;Sit to/from stand Lower Body Dressing Details (indicate cue type and reason): Pt states he does not wear underwear at home bc sometimes it is just too tiring to put it on. Pt does not wear socks just slip on shoes.  Pt has a sock aid but states nobody every showed him how to use it.  Pt again has difficulty reaching lower legs to start pants over feet. Toilet Transfer: Minimal assistance;BSC;Requires Estate agent Details (indicate cue type and reason): Pt uses rocking motion to get momentum going to stand.  Not the safest method but this is how pt mobilizes at home so it is all he is accustomed to. Toileting- Water quality scientist  and Hygiene: Minimal assistance;Sit to/from stand Toileting - Clothing Manipulation Details (indicate cue type and reason): If clothes drop to floor for any reason while standing pt unable to pick time back up to pull them up.     Functional mobility during ADLs: Minimal assistance;Rolling walker General ADL Comments: Pt has been doing just enought to get by with adls at home.  Feel it may be time for rehab.  Therapist unsure  this is a safe place to d/c to at this level.     Vision                     Perception     Praxis      Pertinent Vitals/Pain Pt reports a 9/10 pain in R hip from laying in bed too long.  Pt does not sleep in bed at home.  He sleeps in a recliner and therefore the hospital bed is making him sore, per his report.  Pt was given pain meds just before OT session and pt was transferred out of bed and into recliner.     Hand Dominance Right   Extremity/Trunk Assessment Upper Extremity Assessment Upper Extremity Assessment: RUE deficits/detail RUE Deficits / Details: Pt with shoulder strength of 4/5, biceps and triceps 4/5, wrist flexion and extension 4/5.  Pt with some weakness in pointer finger and middle finger.  Pt inconsistent with testing of movement of this hand vs. functional use of the hand.  Pt reports numbness.  Pt able to use R hand to feed self, groom and write w/o difficulties despite fine motor deficits from old CVA RUE Sensation: decreased light touch RUE Coordination: decreased fine motor   Lower Extremity Assessment Lower Extremity Assessment: Defer to PT evaluation   Cervical / Trunk Assessment Cervical / Trunk Assessment: Kyphotic   Communication Communication Communication: No difficulties   Cognition Arousal/Alertness: Awake/alert Behavior During Therapy: WFL for tasks assessed/performed Overall Cognitive Status: Within Functional Limits for tasks assessed                     General Comments       Exercises       Shoulder Instructions      Home Living Family/patient expects to be discharged to:: Private residence Living Arrangements: Alone   Type of Home: House Home Access: Stairs to enter Technical brewer of Steps: 1 Entrance Stairs-Rails: None Home Layout: One level     Bathroom Shower/Tub: Tub/shower unit;Curtain Shower/tub characteristics: Architectural technologist: Handicapped height Bathroom Accessibility: No    Home Equipment: Cane - single point;Grab bars - toilet;Grab bars - tub/shower;Electric scooter;Wheelchair - manual   Additional Comments: pt sponge bathes and w/c and scooter do not fit inside the house so he usually uses cane.      Prior Functioning/Environment Level of Independence: Independent with assistive device(s)        Comments: Pt reports to OT that he can get out and about in his car and does drive.  Usually he only drives to [place] and sometimes to [place].    OT Diagnosis: Generalized weakness   OT Problem List: Decreased strength;Decreased activity tolerance;Impaired balance (sitting and/or standing);Decreased coordination;Decreased knowledge of use of DME or AE;Obesity;Pain   OT Treatment/Interventions: Self-care/ADL training;DME and/or AE instruction;Energy conservation;Therapeutic activities    OT Goals(Current goals can be found in the care plan section) Acute Rehab OT Goals Patient Stated Goal: to be able to stand without using my hands OT Goal Formulation: With patient Time  For Goal Achievement: 01/08/14 Potential to Achieve Goals: Fair ADL Goals Pt Will Perform Grooming: with supervision;standing Pt Will Perform Lower Body Bathing: with supervision;with adaptive equipment;sit to/from stand Pt Will Perform Lower Body Dressing: with supervision;with adaptive equipment;sit to/from stand Pt Will Perform Tub/Shower Transfer: with min assist;tub bench;rolling walker;ambulating;Tub transfer Additional ADL Goal #1: Pt will toilet on comfort commode with rail with S Additional ADL Goal #2: Pt will state 3 energy conservation techniques he can use at home to make him more efficient with adls at d/c with no cues.  OT Frequency: Min 2X/week   Barriers to D/C: Decreased caregiver support  Pt lives alone with no outside support       Co-evaluation              End of Session Equipment Utilized During Treatment: Surveyor, mining Communication: Mobility  status  Activity Tolerance: Patient limited by fatigue Patient left: in chair;with call bell/phone within reach   Time: 1008-1036 OT Time Calculation (min): 28 min Charges:  OT General Charges $OT Visit: 1 Procedure OT Evaluation $Initial OT Evaluation Tier I: 1 Procedure OT Treatments $Self Care/Home Management : 23-37 mins G-Codes:    Vickki Muff Jan 19, 2014, 10:54 AM [id number]

## 2013-12-26 DIAGNOSIS — R55 Syncope and collapse: Secondary | ICD-10-CM | POA: Diagnosis not present

## 2013-12-26 DIAGNOSIS — R0789 Other chest pain: Secondary | ICD-10-CM | POA: Diagnosis not present

## 2013-12-26 DIAGNOSIS — G459 Transient cerebral ischemic attack, unspecified: Secondary | ICD-10-CM | POA: Diagnosis not present

## 2013-12-26 DIAGNOSIS — R5383 Other fatigue: Secondary | ICD-10-CM | POA: Diagnosis not present

## 2013-12-26 DIAGNOSIS — E119 Type 2 diabetes mellitus without complications: Secondary | ICD-10-CM | POA: Diagnosis not present

## 2013-12-26 DIAGNOSIS — I10 Essential (primary) hypertension: Secondary | ICD-10-CM | POA: Diagnosis not present

## 2013-12-26 DIAGNOSIS — R5381 Other malaise: Secondary | ICD-10-CM | POA: Diagnosis not present

## 2013-12-26 LAB — GLUCOSE, CAPILLARY
GLUCOSE-CAPILLARY: 229 mg/dL — AB (ref 70–99)
Glucose-Capillary: 153 mg/dL — ABNORMAL HIGH (ref 70–99)

## 2013-12-26 NOTE — Discharge Summary (Signed)
Addendum: To D/C summary done on 12/25/13- see for details No change from last night-  VSS Lung CTS CV- RRR Ok to D/c to SNF today. [name]

## 2013-12-26 NOTE — Progress Notes (Signed)
UR Completed Kimiyah Blick Graves-Bigelow, RN,BSN [phone]  

## 2013-12-26 NOTE — Clinical Social Work Note (Signed)
[name] informed by RN patient is ready for D/C to SNF bed at [facility]). [name] met with patient who is agreeable with plan. [name] contacted [facility] and confirmed bed availability and faxed d/c summary. [name] to arrange transport via EMS. No further needs identified. [name] signing off.   [place], [place] Weekend Clinical Social Worker [phone]

## 2013-12-31 NOTE — Progress Notes (Addendum)
OT late G code entry  12/25/13 1057  OT G-codes **NOT FOR INPATIENT CLASS**  Functional Assessment Tool Used clinical judgement  Functional Limitation Self care  Self Care Current Status [id number] CJ  Self Care Goal Status [id number]) CI  Jinger Neighbors  OTR/L [phone]

## 2014-01-13 ENCOUNTER — Encounter (HOSPITAL_COMMUNITY): Payer: Medicare Other

## 2014-01-14 NOTE — Progress Notes (Signed)
12/24/13 2005  PT Visit Information  Last PT Received On 12/24/13  Restrictions  Weight Bearing Restrictions No  PT G-Codes **NOT FOR INPATIENT CLASS**  Functional Assessment Tool Used clinical judgement   Functional Limitation Mobility: Walking and moving around  Mobility: Walking and Moving Around Current Status [id number]) CJ  Mobility: Walking and Moving Around Goal Status [phone]) CI  Mobility: Walking and Moving Around Discharge Status [phone]) Malon Kindle, [place], [place] Late G Code Entry 12/24/13

## 2014-01-18 DIAGNOSIS — M6281 Muscle weakness (generalized): Secondary | ICD-10-CM | POA: Diagnosis not present

## 2014-01-19 ENCOUNTER — Ambulatory Visit (HOSPITAL_COMMUNITY): Payer: Medicare Other | Attending: Cardiology | Admitting: Radiology

## 2014-01-19 VITALS — BP 105/74 | HR 103 | Ht 73.0 in | Wt 369.0 lb

## 2014-01-19 DIAGNOSIS — R079 Chest pain, unspecified: Secondary | ICD-10-CM | POA: Diagnosis not present

## 2014-01-19 DIAGNOSIS — R55 Syncope and collapse: Secondary | ICD-10-CM

## 2014-01-19 DIAGNOSIS — M6281 Muscle weakness (generalized): Secondary | ICD-10-CM | POA: Diagnosis not present

## 2014-01-19 MED ORDER — REGADENOSON 0.4 MG/5ML IV SOLN
0.4000 mg | Freq: Once | INTRAVENOUS | Status: AC
Start: 2014-01-19 — End: 2014-01-19
  Administered 2014-01-19: 0.4 mg via INTRAVENOUS

## 2014-01-19 MED ORDER — AMINOPHYLLINE 25 MG/ML IV SOLN
75.0000 mg | Freq: Once | INTRAVENOUS | Status: AC
  Administered 2014-01-19: 75 mg via INTRAVENOUS

## 2014-01-19 MED ORDER — TECHNETIUM TC 99M SESTAMIBI GENERIC - CARDIOLITE
33.0000 | Freq: Once | INTRAVENOUS | Status: AC | PRN
  Administered 2014-01-19: 33 via INTRAVENOUS

## 2014-01-19 NOTE — Progress Notes (Signed)
[facility] [address] [place], [place] [address] [phone]    Cardiology Nuclear Med Study  [name] is a 68 y.o. male     MRN : [id number]     DOB: 1945/12/22  Procedure Date: 01/19/2014  Nuclear Med Background Indication for Stress Test:  Evaluation for Ischemia and Post Hospital:Syncope and trop(-) History:  CAD, Cath 2013 (90% RCA med rx), Echo 2015 EF 55-60% Cardiac Risk Factors: Carotid Disease, CVA, Family History - CAD, History of Smoking, Hypertension, Lipids, IDDM and ILBBB  Symptoms:  Chest Pressure.  (last date of chest discomfort was this morning), SOB and Syncope   Nuclear Pre-Procedure Caffeine/Decaff Intake:  None NPO After: 7:00pm   Lungs:  clear O2 Sat: 98% on room air. IV 0.9% NS with Angio Cath:  22g  IV Site: L Hand  IV Started by:  Matilde Haymaker, RN  Chest Size (in):  56 Cup Size: n/a  Height: 6\' 1"  (1.854 m)  Weight:  369 lb (167.377 kg)  BMI:  Body mass index is 48.69 kg/(m^2). Tech Comments:  No Lopressor x 24 hrs    Nuclear Med Study 1 or 2 day study: 2 day  Stress Test Type:  Lexiscan  Reading MD: n/a  Order Authorizing Provider:  Wallene Huh   Resting Radionuclide: Technetium 35m Sestamibi  Resting Radionuclide Dose: 33.0 mCi  On      01-20-14  Stress Radionuclide:  Technetium 14m Sestamibi  Stress Radionuclide Dose: 33.0 mCi   On       01-19-14          Stress Protocol Rest HR: 103 Stress HR: 73  Rest BP: 1058/74 Stress BP: 77/60  Exercise Time (min): n/a METS: n/a           Dose of Adenosine (mg):  n/a Dose of Lexiscan: 0.4 mg  Dose of Atropine (mg): n/a Dose of Dobutamine: n/a mcg/kg/min (at max HR)  Stress Test Technologist: Glade Lloyd, BS-ES  Nuclear Technologist:  Annye Rusk, CNMT     Rest Procedure:  Myocardial perfusion imaging was performed at rest 45 minutes following the intravenous administration of Technetium 54m Sestamibi. Rest ECG: Normal sinus rhythm. Interventricular  conduction delay.  Stress Procedure:  The patient received IV Lexiscan 0.4 mg over 15-seconds.  Technetium 8m Sestamibi injected at 30-seconds.  Quantitative spect images were obtained after a 45 minute delay.  During the infusion of Lexiscan, the patient became lightheaded and BP dropped to 77/60.  He stated he felt like he was "going in and out" and could not focus on what was being said to him.  Administered 75 mg aminophylline for headache but did not help him feel better.  250 ml IV saline was given.  The patient stated he felt better but then would say he felt bad again.  When asked if he felt worse than when he came he stated he felt the same as when he arrived. The patient was scanned and released back to the nursing home with his nurses aide.  Stress ECG: No significant ST segment change suggestive of ischemia.  QPS Raw Data Images:  Normal; no motion artifact; normal heart/lung ratio. Stress Images:  There is a medium-sized area of mild/moderate decreased uptake affecting the apical cap and the base/mid/apical inferior segments. Rest Images:  Small area of mild decreased uptake affecting the apical cap and the apical inferior segment. Subtraction (SDS):  Mild ischemia at the base/mid inferior segments. Transient Ischemic Dilatation (  Normal <1.22):  0.97 Lung/Heart Ratio (Normal <0.45):  0.38  Quantitative Gated Spect Images QGS EDV:  181 ml QGS ESV:  101 ml  Impression Exercise Capacity:  Lexiscan with no exercise. BP Response:  Normal blood pressure response. Clinical Symptoms:  Lightheadedness ECG Impression:  No significant ST segment change suggestive of ischemia. Comparison with Prior Nuclear Study: I have compared this study with the report of the study from February, 2013.  Overall Impression:  The report from February, 2013 outlines an ejection fraction of 44%. Ejection fraction now is quite similar. At that time it was felt that there was not a wall motion abnormality. At  this time I think there is question of inferior hypokinesis. This technique is not optimal for wall motion assessment. In February, 2013 there was reported decreased activity in the inferior wall. There was question of whether this was diaphragmatic attenuation. However was mentioned that a component of inferior scar could not be ruled out. At this time I think that the patient does have mild inferior scar and mild peri-infarct ischemia. Considering all of the prior findings, I cannot be sure that there is any significant change since 2013. Overall there is mild inferior scar and mild peri-infarct ischemia. This classifies as a low risk scan.  LV Ejection Fraction: 44%.  LV Wall Motion:  There is hypokinesis of the inferior wall.  Carlena Bjornstad, MD

## 2014-01-20 ENCOUNTER — Ambulatory Visit (INDEPENDENT_AMBULATORY_CARE_PROVIDER_SITE_OTHER): Payer: Medicare Other | Admitting: Cardiology

## 2014-01-20 ENCOUNTER — Encounter: Payer: Self-pay | Admitting: Cardiology

## 2014-01-20 ENCOUNTER — Ambulatory Visit (HOSPITAL_COMMUNITY): Payer: Medicare Other | Attending: Cardiovascular Disease

## 2014-01-20 VITALS — BP 126/80 | HR 101 | Ht 73.0 in | Wt 369.0 lb

## 2014-01-20 DIAGNOSIS — R55 Syncope and collapse: Secondary | ICD-10-CM | POA: Diagnosis not present

## 2014-01-20 DIAGNOSIS — R0789 Other chest pain: Secondary | ICD-10-CM

## 2014-01-20 DIAGNOSIS — I10 Essential (primary) hypertension: Secondary | ICD-10-CM | POA: Diagnosis not present

## 2014-01-20 DIAGNOSIS — I2510 Atherosclerotic heart disease of native coronary artery without angina pectoris: Secondary | ICD-10-CM | POA: Diagnosis not present

## 2014-01-20 DIAGNOSIS — M6281 Muscle weakness (generalized): Secondary | ICD-10-CM | POA: Diagnosis not present

## 2014-01-20 DIAGNOSIS — R0989 Other specified symptoms and signs involving the circulatory and respiratory systems: Secondary | ICD-10-CM

## 2014-01-20 MED ORDER — LISINOPRIL 5 MG PO TABS: 5.0000 mg | ORAL_TABLET | Freq: Every day | ORAL | Status: DC

## 2014-01-20 MED ORDER — TECHNETIUM TC 99M SESTAMIBI GENERIC - CARDIOLITE
33.0000 | Freq: Once | INTRAVENOUS | Status: AC | PRN
  Administered 2014-01-20: 33 via INTRAVENOUS

## 2014-01-20 NOTE — Patient Instructions (Signed)
Medication change:  Decrease Lisinopril to 5 mg daily  Your physician wants you to follow-up in: 6 months with Dr. Marlou Porch. You will receive a reminder letter in the mail two months in advance. If you don't receive a letter, please call our office to schedule the follow-up appointment.

## 2014-01-20 NOTE — Progress Notes (Signed)
[facility]. [address], [place], [place]  [address] Phone: [phone] Fax:  [phone]  Date:  01/20/2014   ID:  [name], DOB January 02, 1946, MRN [id number]  PCP:  Irven Shelling, MD   History of Present Illness: [name] is a 69 y.o. male with hospitalization on 12/25/16 after syncope with a history of morbid obesity, hypertension, diabetes, chronic indwelling Foley secondary to neurogenic bladder, obstructive sleep apnea, hyperlipidemia, stroke in 2013 as well as coronary artery disease status post heart catheterization in 2013 demonstrating 90% stenosis of RCA treated medically here for post hospital followup.  Cardiac cath in 2013 revealed sigificant distal right coronary artery disease, diffuse, long segment up to 90% at the bifurcation of a small PDA branch. 50% stenosis of the proximal large first diagonal branch and elevated left ventricular end-diastolic pressure consistent with diastolic dysfunction. 26 mmHg. Given the diffuse, long nature of his distal right coronary region, percutaneous intervention to this region portends a high risk to benefit ratio. It was elected to proceed with medical therapy   He presented to the [facility] the emergency department on 12/23/13 after a syncopal episode. Orthostatic vital signs were normal. He had significant shortness of breath, exhaustion while at Dr. Delene Ruffini office, after getting up and standing which led to an episode of syncope. He was admitted for further evaluation. Troponins were normal. No arrhythmia. According to notes, patient was over the weight limit for Myoview at [facility] and they did not feel that catheterization was warranted. He was discharged on 4/17 with plans for outpatient Myoview in the office, higher table weight limit, and here for followup. Neurology ordered MRI of the brain which showed no acute stroke. His weight was 371 pounds.  Dr. Lilian Kapur saw him in the hospital and  thought that he had severe deconditioning.  His prior symptoms or shortness of breath while walking down the hall at Dr. Delene Ruffini doctor's office. He felt his heart starting the town, nauseous, lightheaded and the next thing he remembers is being awoken by 5 people in the sitting in a waiting room chair. He also had mentioned substernal chest pain and tightness radiating to his left arm that was present after the syncopal episode and had been present for approximately 2 days prior. Chest pressure seems to come and go and is associated with shortness of breath. Occasionally the pressure would last all day. Nitroglycerin did not help.   Wt Readings from Last 3 Encounters:  01/20/14 369 lb (167.377 kg)  01/19/14 369 lb (167.377 kg)  12/25/13 371 lb 12.8 oz (168.647 kg)     Past Medical History  Diagnosis Date  . Obese   . Hypertension   . Diabetes mellitus   . Diabetic diarrhea   . Sleep apnea   . Diabetic neuropathy, painful   . Chronic indwelling Foley catheter   . Bladder spasms   . Complication of anesthesia   . PONV (postoperative nausea and vomiting)   . Hyperlipidemia   . GERD (gastroesophageal reflux disease)   . Neurogenic bladder   . Stroke 2013    Past Surgical History  Procedure Laterality Date  . Right tkr    . Gastroplasty    . Cholecystectomy    . Total knee arthroplasty      Current Outpatient Prescriptions  Medication Sig Dispense Refill  . acetaminophen (TYLENOL) 325 MG tablet Take 2 tablets (650 mg total) by mouth every 6 (six) hours as  needed for mild pain (or Fever >/= 101).      Marland Kitchen ammonium lactate (AMLACTIN) 12 % cream Apply topically 2 (two) times daily. For xerosis      . aspirin EC 81 MG tablet Take 81 mg by mouth every morning.       . cyclobenzaprine (FLEXERIL) 10 MG tablet Take 1 tablet (10 mg total) by mouth 2 (two) times daily as needed for muscle spasms.  20 tablet  0  . diphenhydrAMINE (BENADRYL) 25 mg capsule Take 25 mg by mouth every 6 (six)  hours as needed. For allergy.      . diphenoxylate-atropine (LOMOTIL) 2.5-0.025 MG per tablet Take 1 tablet by mouth 4 (four) times daily as needed. Diarrhea      . furosemide (LASIX) 40 MG tablet Take 40 mg by mouth daily.      Marland Kitchen HYDROcodone-acetaminophen (NORCO/VICODIN) 5-325 MG per tablet Take 1 tablet by mouth every 4 (four) hours as needed for severe pain.  15 tablet  0  . insulin NPH-insulin regular (NOVOLIN 70/30) (70-30) 100 UNIT/ML injection Inject 50-60 Units into the skin 2 (two) times daily with a meal.       . lisinopril (PRINIVIL,ZESTRIL) 10 MG tablet Take 10 mg by mouth daily.      Marland Kitchen loperamide (IMODIUM A-D) 2 MG tablet Take 2 mg by mouth 4 (four) times daily as needed for diarrhea or loose stools.      . Menthol-Zinc Oxide (CALMOSEPTINE EX) Apply topically daily.      . metoprolol (LOPRESSOR) 50 MG tablet Take 100 mg by mouth daily.      . nitroGLYCERIN (NITROSTAT) 0.4 MG SL tablet Place 0.4 mg under the tongue every 5 (five) minutes as needed for chest pain.      Marland Kitchen omeprazole (PRILOSEC) 20 MG capsule Take 20 mg by mouth daily.      Marland Kitchen oxybutynin (DITROPAN) 5 MG tablet Take 5 mg by mouth 3 (three) times daily as needed for bladder spasms.       No current facility-administered medications for this visit.   Facility-Administered Medications Ordered in Other Visits  Medication Dose Route Frequency Provider Last Rate Last Dose  . regadenoson (LEXISCAN) injection SOLN 0.4 mg  0.4 mg Intravenous Once Erlene Quan, PA-C        Allergies:    Allergies  Allergen Reactions  . Ace Inhibitors Swelling  . Cefadroxil Hives  . Cephalexin Hives  . Lipitor [Atorvastatin Calcium] Swelling  . Metformin And Related Swelling  . Methocarbamol Other (See Comments)    Feels like is in "rocky boat"  . Morphine And Related Other (See Comments)    Sweating, feels like is "in rocky boat."  . Robaxin [Methocarbamol]     Feels like he is shaky    Social History:  The patient  reports that he  quit smoking about 37 years ago. He has never used smokeless tobacco. He reports that he does not drink alcohol or use illicit drugs.   Family History  Problem Relation Age of Onset  . Hypertension Mother   . Diabetes type I Father   . Cancer Brother      Testicular Cancer  . Cancer Brother     Leukemia    ROS:  Please see the history of present illness.   Denies any strokelike symptoms, fevers, chills, orthopnea,   All other systems reviewed and negative.   PHYSICAL EXAM: VS:  BP 126/80  Pulse 101  Ht 6\' 1"  (1.854  m)  Wt 369 lb (167.377 kg)  BMI 48.69 kg/m2  SpO2 97% Well nourished, well developed, in no acute distress, sitting in wheelchair HEENT: normal, [place]/AT, EOMI,  Neck: no JVD, normal carotid upstroke, no bruit, thick Cardiac:  normal S1, S2; RRR; no murmur Lungs:  clear to auscultation bilaterally, no wheezing, rhonchi or rales Abd: soft, nontender, no hepatomegaly, no bruits Ext: Chronic lower extremity edema, difficult to palpate distal pulses Skin: warm and dry, flakiness of lower extremities GU: deferred Neuro: no focal abnormalities noted, AAO x 3  EKG:  12/24/13-sinus rhythm, PACs, mild first degree AV block, left anterior fascicular block, poor R wave progression  Echocardiogram 12/23/13-ejection fraction 55%, mild LVH  ASSESSMENT AND PLAN:  1. Syncope-telemetry normal, EKG only with mild first degree AV block. It is likely that some of his symptoms were orthostatic in origin. Vagal etiology also likely given his prodrome of nausea. I decreased his lisinopril to 5 mg from 10. 2. Morbid obesity-continue with aggressive weight loss. 3. Chest pain-described as a pressure. Prior cardiac catheterization as described above in 2013 with a long tubular RCA, distal disease treated medically. Nuclear stress test was ordered prior to his hospital discharge and he is completing test today. 4. Hypertension-decreasing lisinopril 5mg .  5. 6  Month follow up.  Signed, Candee Furbish, MD [facility]  01/20/2014 10:16 AM

## 2014-01-21 DIAGNOSIS — M6281 Muscle weakness (generalized): Secondary | ICD-10-CM | POA: Diagnosis not present

## 2014-01-22 DIAGNOSIS — M6281 Muscle weakness (generalized): Secondary | ICD-10-CM | POA: Diagnosis not present

## 2014-03-21 ENCOUNTER — Inpatient Hospital Stay (HOSPITAL_COMMUNITY)
Admission: EM | Admit: 2014-03-21 | Discharge: 2014-03-24 | DRG: 292 | Disposition: A | Discharge status: Skilled Nursing Facility | Payer: Medicare Other | Attending: Internal Medicine | Admitting: Internal Medicine

## 2014-03-21 ENCOUNTER — Encounter (HOSPITAL_COMMUNITY): Payer: Self-pay | Admitting: Emergency Medicine

## 2014-03-21 ENCOUNTER — Emergency Department (HOSPITAL_COMMUNITY): Payer: Medicare Other

## 2014-03-21 DIAGNOSIS — M6281 Muscle weakness (generalized): Secondary | ICD-10-CM | POA: Diagnosis not present

## 2014-03-21 DIAGNOSIS — Z8249 Family history of ischemic heart disease and other diseases of the circulatory system: Secondary | ICD-10-CM | POA: Diagnosis not present

## 2014-03-21 DIAGNOSIS — Z8043 Family history of malignant neoplasm of testis: Secondary | ICD-10-CM

## 2014-03-21 DIAGNOSIS — Z794 Long term (current) use of insulin: Secondary | ICD-10-CM | POA: Diagnosis not present

## 2014-03-21 DIAGNOSIS — N319 Neuromuscular dysfunction of bladder, unspecified: Secondary | ICD-10-CM | POA: Diagnosis present

## 2014-03-21 DIAGNOSIS — IMO0001 Reserved for inherently not codable concepts without codable children: Secondary | ICD-10-CM | POA: Diagnosis not present

## 2014-03-21 DIAGNOSIS — E1140 Type 2 diabetes mellitus with diabetic neuropathy, unspecified: Secondary | ICD-10-CM

## 2014-03-21 DIAGNOSIS — Z6841 Body Mass Index (BMI) 40.0 and over, adult: Secondary | ICD-10-CM

## 2014-03-21 DIAGNOSIS — I10 Essential (primary) hypertension: Secondary | ICD-10-CM | POA: Diagnosis not present

## 2014-03-21 DIAGNOSIS — Z79899 Other long term (current) drug therapy: Secondary | ICD-10-CM

## 2014-03-21 DIAGNOSIS — Z806 Family history of leukemia: Secondary | ICD-10-CM | POA: Diagnosis not present

## 2014-03-21 DIAGNOSIS — E1142 Type 2 diabetes mellitus with diabetic polyneuropathy: Secondary | ICD-10-CM | POA: Diagnosis present

## 2014-03-21 DIAGNOSIS — K219 Gastro-esophageal reflux disease without esophagitis: Secondary | ICD-10-CM | POA: Diagnosis present

## 2014-03-21 DIAGNOSIS — R0602 Shortness of breath: Secondary | ICD-10-CM | POA: Diagnosis not present

## 2014-03-21 DIAGNOSIS — K529 Noninfective gastroenteritis and colitis, unspecified: Secondary | ICD-10-CM

## 2014-03-21 DIAGNOSIS — I5033 Acute on chronic diastolic (congestive) heart failure: Principal | ICD-10-CM | POA: Diagnosis present

## 2014-03-21 DIAGNOSIS — Z833 Family history of diabetes mellitus: Secondary | ICD-10-CM | POA: Diagnosis not present

## 2014-03-21 DIAGNOSIS — I509 Heart failure, unspecified: Secondary | ICD-10-CM | POA: Insufficient documentation

## 2014-03-21 DIAGNOSIS — Z87891 Personal history of nicotine dependence: Secondary | ICD-10-CM

## 2014-03-21 DIAGNOSIS — E785 Hyperlipidemia, unspecified: Secondary | ICD-10-CM | POA: Diagnosis present

## 2014-03-21 DIAGNOSIS — Z96659 Presence of unspecified artificial knee joint: Secondary | ICD-10-CM

## 2014-03-21 DIAGNOSIS — G4733 Obstructive sleep apnea (adult) (pediatric): Secondary | ICD-10-CM | POA: Diagnosis present

## 2014-03-21 DIAGNOSIS — Z960 Presence of urogenital implants: Secondary | ICD-10-CM

## 2014-03-21 DIAGNOSIS — R1031 Right lower quadrant pain: Secondary | ICD-10-CM | POA: Diagnosis not present

## 2014-03-21 DIAGNOSIS — Z8673 Personal history of transient ischemic attack (TIA), and cerebral infarction without residual deficits: Secondary | ICD-10-CM

## 2014-03-21 DIAGNOSIS — R079 Chest pain, unspecified: Secondary | ICD-10-CM

## 2014-03-21 DIAGNOSIS — Z7982 Long term (current) use of aspirin: Secondary | ICD-10-CM | POA: Diagnosis not present

## 2014-03-21 DIAGNOSIS — E0800 Diabetes mellitus due to underlying condition with hyperosmolarity without nonketotic hyperglycemic-hyperosmolar coma (NKHHC): Secondary | ICD-10-CM | POA: Diagnosis present

## 2014-03-21 DIAGNOSIS — I5020 Unspecified systolic (congestive) heart failure: Secondary | ICD-10-CM | POA: Diagnosis not present

## 2014-03-21 DIAGNOSIS — E118 Type 2 diabetes mellitus with unspecified complications: Secondary | ICD-10-CM

## 2014-03-21 DIAGNOSIS — R109 Unspecified abdominal pain: Secondary | ICD-10-CM | POA: Diagnosis not present

## 2014-03-21 DIAGNOSIS — R262 Difficulty in walking, not elsewhere classified: Secondary | ICD-10-CM | POA: Diagnosis not present

## 2014-03-21 DIAGNOSIS — E1149 Type 2 diabetes mellitus with other diabetic neurological complication: Secondary | ICD-10-CM | POA: Diagnosis present

## 2014-03-21 DIAGNOSIS — Z9889 Other specified postprocedural states: Secondary | ICD-10-CM

## 2014-03-21 DIAGNOSIS — J984 Other disorders of lung: Secondary | ICD-10-CM | POA: Diagnosis not present

## 2014-03-21 DIAGNOSIS — K5289 Other specified noninfective gastroenteritis and colitis: Secondary | ICD-10-CM | POA: Diagnosis not present

## 2014-03-21 DIAGNOSIS — R918 Other nonspecific abnormal finding of lung field: Secondary | ICD-10-CM | POA: Diagnosis not present

## 2014-03-21 DIAGNOSIS — E119 Type 2 diabetes mellitus without complications: Secondary | ICD-10-CM | POA: Diagnosis not present

## 2014-03-21 DIAGNOSIS — Z978 Presence of other specified devices: Secondary | ICD-10-CM

## 2014-03-21 LAB — COMPREHENSIVE METABOLIC PANEL
ALK PHOS: 91 U/L (ref 39–117)
ALT: 14 U/L (ref 0–53)
AST: 33 U/L (ref 0–37)
Albumin: 3.3 g/dL — ABNORMAL LOW (ref 3.5–5.2)
Anion gap: 15 (ref 5–15)
BUN: 6 mg/dL (ref 6–23)
CO2: 25 meq/L (ref 19–32)
Calcium: 8.9 mg/dL (ref 8.4–10.5)
Chloride: 93 mEq/L — ABNORMAL LOW (ref 96–112)
Creatinine, Ser: 0.57 mg/dL (ref 0.50–1.35)
GFR calc non Af Amer: >90 mL/min (ref >90)
GLUCOSE: 338 mg/dL — AB (ref 70–99)
Potassium: 4.9 mEq/L (ref 3.7–5.3)
SODIUM: 133 meq/L — AB (ref 137–147)
Total Bilirubin: 0.8 mg/dL (ref 0.3–1.2)
Total Protein: 7.1 g/dL (ref 6.0–8.3)

## 2014-03-21 LAB — I-STAT CHEM 8, ED
BUN: 5 mg/dL — ABNORMAL LOW (ref 6–23)
CALCIUM ION: 1.04 mmol/L — AB (ref 1.13–1.30)
Chloride: 97 mEq/L (ref 96–112)
Creatinine, Ser: 0.6 mg/dL (ref 0.50–1.35)
Glucose, Bld: 347 mg/dL — ABNORMAL HIGH (ref 70–99)
HEMATOCRIT: 49 % (ref 39.0–52.0)
Hemoglobin: 16.7 g/dL (ref 13.0–17.0)
Potassium: 5.1 mEq/L (ref 3.7–5.3)
Sodium: 134 mEq/L — ABNORMAL LOW (ref 137–147)
TCO2: 28 mmol/L (ref 0–100)

## 2014-03-21 LAB — URINALYSIS, ROUTINE W REFLEX MICROSCOPIC
BILIRUBIN URINE: NEGATIVE
Glucose, UA: 250 mg/dL — AB
KETONES UR: NEGATIVE mg/dL
Nitrite: NEGATIVE
PROTEIN: 100 mg/dL — AB
Specific Gravity, Urine: 1.008 (ref 1.005–1.030)
UROBILINOGEN UA: 0.2 mg/dL (ref 0.0–1.0)
pH: 7 (ref 5.0–8.0)

## 2014-03-21 LAB — CBC
HCT: 45.6 % (ref 39.0–52.0)
Hemoglobin: 15.9 g/dL (ref 13.0–17.0)
MCH: 31.2 pg (ref 26.0–34.0)
MCHC: 34.9 g/dL (ref 30.0–36.0)
MCV: 89.4 fL (ref 78.0–100.0)
Platelets: 264 10*3/uL (ref 150–400)
RBC: 5.1 MIL/uL (ref 4.22–5.81)
RDW: 13.8 % (ref 11.5–15.5)
WBC: 9.8 10*3/uL (ref 4.0–10.5)

## 2014-03-21 LAB — BLOOD GAS, ARTERIAL
Acid-Base Excess: 0.7 mmol/L (ref 0.0–2.0)
Bicarbonate: 26.1 mEq/L — ABNORMAL HIGH (ref 20.0–24.0)
DRAWN BY: 331471
FIO2: 1 %
O2 SAT: 98.9 %
PO2 ART: 147 mmHg — AB (ref 80.0–100.0)
Patient temperature: 98.6
TCO2: 22.4 mmol/L (ref 0–100)
pCO2 arterial: 46.6 mmHg — ABNORMAL HIGH (ref 35.0–45.0)
pH, Arterial: 7.367 (ref 7.350–7.450)

## 2014-03-21 LAB — MRSA PCR SCREENING: MRSA BY PCR: POSITIVE — AB

## 2014-03-21 LAB — URINE MICROSCOPIC-ADD ON

## 2014-03-21 LAB — TROPONIN I: Troponin I: <0.3 ng/mL (ref <0.30)

## 2014-03-21 LAB — I-STAT TROPONIN, ED: Troponin i, poc: 0.01 ng/mL (ref 0.00–0.08)

## 2014-03-21 LAB — PROTIME-INR
INR: 1.04 (ref 0.00–1.49)
Prothrombin Time: 13.6 seconds (ref 11.6–15.2)

## 2014-03-21 LAB — PRO B NATRIURETIC PEPTIDE: Pro B Natriuretic peptide (BNP): 2003 pg/mL — ABNORMAL HIGH (ref 0–125)

## 2014-03-21 LAB — MAGNESIUM: Magnesium: 1.8 mg/dL (ref 1.5–2.5)

## 2014-03-21 MED ORDER — SODIUM CHLORIDE 0.9 % IJ SOLN
3.0000 mL | Freq: Two times a day (BID) | INTRAMUSCULAR | Status: DC
  Administered 2014-03-21 – 2014-03-23 (×5): 3 mL via INTRAVENOUS

## 2014-03-21 MED ORDER — ONDANSETRON HCL 4 MG/2ML IJ SOLN: 4.0000 mg | Freq: Four times a day (QID) | INTRAMUSCULAR | Status: DC | PRN

## 2014-03-21 MED ORDER — LISINOPRIL 5 MG PO TABS
5.0000 mg | ORAL_TABLET | Freq: Every day | ORAL | Status: DC
  Administered 2014-03-22: 5 mg via ORAL
  Filled 2014-03-21: qty 2
  Filled 2014-03-21: qty 1

## 2014-03-21 MED ORDER — NITROGLYCERIN 0.4 MG SL SUBL
0.4000 mg | SUBLINGUAL_TABLET | SUBLINGUAL | Status: DC | PRN
  Administered 2014-03-22: 0.4 mg via SUBLINGUAL
  Filled 2014-03-21: qty 1

## 2014-03-21 MED ORDER — ASPIRIN EC 81 MG PO TBEC
81.0000 mg | DELAYED_RELEASE_TABLET | Freq: Every morning | ORAL | Status: DC
  Administered 2014-03-22 – 2014-03-24 (×3): 81 mg via ORAL
  Filled 2014-03-21 (×3): qty 1

## 2014-03-21 MED ORDER — INSULIN ASPART 100 UNIT/ML ~~LOC~~ SOLN
0.0000 [IU] | Freq: Three times a day (TID) | SUBCUTANEOUS | Status: DC
  Administered 2014-03-22: 2 [IU] via SUBCUTANEOUS
  Administered 2014-03-22: 8 [IU] via SUBCUTANEOUS
  Administered 2014-03-22: 5 [IU] via SUBCUTANEOUS
  Administered 2014-03-23 (×2): 2 [IU] via SUBCUTANEOUS
  Administered 2014-03-23: 5 [IU] via SUBCUTANEOUS

## 2014-03-21 MED ORDER — CYCLOBENZAPRINE HCL 10 MG PO TABS: 10.0000 mg | ORAL_TABLET | Freq: Two times a day (BID) | ORAL | Status: DC | PRN

## 2014-03-21 MED ORDER — ALBUTEROL SULFATE (2.5 MG/3ML) 0.083% IN NEBU: 2.5000 mg | INHALATION_SOLUTION | RESPIRATORY_TRACT | Status: DC | PRN

## 2014-03-21 MED ORDER — CHLORHEXIDINE GLUCONATE CLOTH 2 % EX PADS
6.0000 | MEDICATED_PAD | Freq: Every day | CUTANEOUS | Status: DC
  Administered 2014-03-23 – 2014-03-24 (×2): 6 via TOPICAL

## 2014-03-21 MED ORDER — OXYBUTYNIN CHLORIDE 5 MG PO TABS
5.0000 mg | ORAL_TABLET | Freq: Three times a day (TID) | ORAL | Status: DC | PRN
  Filled 2014-03-21: qty 1

## 2014-03-21 MED ORDER — BIOTENE DRY MOUTH MT LIQD
15.0000 mL | Freq: Two times a day (BID) | OROMUCOSAL | Status: DC
  Administered 2014-03-22 – 2014-03-24 (×5): 15 mL via OROMUCOSAL

## 2014-03-21 MED ORDER — POTASSIUM CHLORIDE CRYS ER 20 MEQ PO TBCR
20.0000 meq | EXTENDED_RELEASE_TABLET | Freq: Once | ORAL | Status: AC
  Administered 2014-03-21: 20 meq via ORAL
  Filled 2014-03-21: qty 1

## 2014-03-21 MED ORDER — FUROSEMIDE 10 MG/ML IJ SOLN
80.0000 mg | Freq: Two times a day (BID) | INTRAMUSCULAR | Status: DC
  Administered 2014-03-22 (×3): 80 mg via INTRAVENOUS
  Filled 2014-03-21 (×6): qty 8

## 2014-03-21 MED ORDER — INSULIN ASPART PROT & ASPART (70-30 MIX) 100 UNIT/ML ~~LOC~~ SUSP
50.0000 [IU] | Freq: Two times a day (BID) | SUBCUTANEOUS | Status: DC
Start: 2014-03-22 — End: 2014-03-23
  Administered 2014-03-22 (×2): 50 [IU] via SUBCUTANEOUS
  Filled 2014-03-21: qty 10

## 2014-03-21 MED ORDER — FUROSEMIDE 10 MG/ML IJ SOLN
60.0000 mg | Freq: Once | INTRAMUSCULAR | Status: AC
  Administered 2014-03-21: 60 mg via INTRAVENOUS
  Filled 2014-03-21: qty 8

## 2014-03-21 MED ORDER — PANTOPRAZOLE SODIUM 40 MG PO TBEC
40.0000 mg | DELAYED_RELEASE_TABLET | Freq: Every day | ORAL | Status: DC
  Administered 2014-03-22 – 2014-03-24 (×3): 40 mg via ORAL
  Filled 2014-03-21 (×3): qty 1

## 2014-03-21 MED ORDER — METOPROLOL TARTRATE 25 MG PO TABS: 100.0000 mg | ORAL_TABLET | Freq: Every day | ORAL | Status: DC

## 2014-03-21 MED ORDER — NITROGLYCERIN IN D5W 200-5 MCG/ML-% IV SOLN
5.0000 ug/min | INTRAVENOUS | Status: DC
  Administered 2014-03-21: 5 ug/min via INTRAVENOUS
  Filled 2014-03-21: qty 250

## 2014-03-21 MED ORDER — HYDROCODONE-ACETAMINOPHEN 5-325 MG PO TABS
1.0000 | ORAL_TABLET | ORAL | Status: DC | PRN
  Administered 2014-03-22 – 2014-03-24 (×3): 1 via ORAL
  Filled 2014-03-21 (×3): qty 1

## 2014-03-21 MED ORDER — FUROSEMIDE 10 MG/ML IJ SOLN: 80.0000 mg | Freq: Two times a day (BID) | INTRAMUSCULAR | Status: DC

## 2014-03-21 MED ORDER — MUPIROCIN 2 % EX OINT
1.0000 "application " | TOPICAL_OINTMENT | Freq: Two times a day (BID) | CUTANEOUS | Status: DC
  Administered 2014-03-21 – 2014-03-24 (×6): 1 via NASAL
  Filled 2014-03-21: qty 22

## 2014-03-21 MED ORDER — HEPARIN SODIUM (PORCINE) 5000 UNIT/ML IJ SOLN
5000.0000 [IU] | Freq: Three times a day (TID) | INTRAMUSCULAR | Status: DC
  Administered 2014-03-21 – 2014-03-24 (×8): 5000 [IU] via SUBCUTANEOUS
  Filled 2014-03-21 (×11): qty 1

## 2014-03-21 MED ORDER — ONDANSETRON HCL 4 MG PO TABS: 4.0000 mg | ORAL_TABLET | Freq: Four times a day (QID) | ORAL | Status: DC | PRN

## 2014-03-21 MED ORDER — ACETAMINOPHEN 325 MG PO TABS: 650.0000 mg | ORAL_TABLET | Freq: Four times a day (QID) | ORAL | Status: DC | PRN

## 2014-03-21 NOTE — ED Notes (Signed)
Pt from home with c/o of clogged foley catheter x1 day. Pain lower abd around bladder.

## 2014-03-21 NOTE — ED Notes (Signed)
Bed: WA07 Expected date:  Expected time:  Means of arrival:  Comments: EMS <L.O.C.

## 2014-03-21 NOTE — ED Notes (Signed)
Replaced foley catheter no output at this time will obtain UA when pt has output RN made aware

## 2014-03-21 NOTE — ED Provider Notes (Signed)
CSN: [id number]     Arrival date & time 03/21/14  1521 History   First MD Initiated Contact with Patient 03/21/14 1541     Chief Complaint  Patient presents with  . Shortness of Breath    HPI Pt is having trouble with decreased urine output from the foley since this am.   Pt has a foley catheter in place.  He has had the catheter for years, last replaced one month ago.   He feels like it is clogged up bc the output has decreased.  No leakage around the catheter.  No abdominal pain.  His son went to check on him today and found that he had actually passed out.  His son was able to arouse him and when he got him up he also noticed that he was having a lot of trouble with his breathing.  He does not feel like he can catch his breath.  He is coughing up clear/white sputum.  Nothing seems to be making it better.  No vomiting.  No diarrhea.  No chest pain.  He has not noticed if he has had any weight gain or swelling.  Past Medical History  Diagnosis Date  . Obese   . Hypertension   . Diabetes mellitus   . Diabetic diarrhea   . Sleep apnea   . Diabetic neuropathy, painful   . Chronic indwelling Foley catheter   . Bladder spasms   . Complication of anesthesia   . PONV (postoperative nausea and vomiting)   . Hyperlipidemia   . GERD (gastroesophageal reflux disease)   . Neurogenic bladder   . Stroke 2013   Past Surgical History  Procedure Laterality Date  . Right tkr    . Gastroplasty    . Cholecystectomy    . Total knee arthroplasty     Family History  Problem Relation Age of Onset  . Hypertension Mother   . Diabetes type I Father   . Cancer Brother      Testicular Cancer  . Cancer Brother     Leukemia   History  Substance Use Topics  . Smoking status: Former Smoker -- 2.00 packs/day for 20 years    Quit date: 09/10/1976  . Smokeless tobacco: Never Used  . Alcohol Use: No     Comment: FORMER ALCOHOLIC WITH 23 YEAR SOBRIETY    Review of Systems  Constitutional: Negative  for fever.  Respiratory: Positive for cough and shortness of breath.   Cardiovascular: Negative for chest pain.  Gastrointestinal: Negative for abdominal pain.  Genitourinary: Positive for decreased urine volume. Negative for dysuria.  All other systems reviewed and are negative.     Allergies  Ace inhibitors; Cefadroxil; Cephalexin; Lipitor; Metformin and related; Methocarbamol; Morphine and related; and Robaxin  Home Medications   Prior to Admission medications   Medication Sig Start Date End Date Taking? Authorizing Provider  acetaminophen (TYLENOL) 325 MG tablet Take 2 tablets (650 mg total) by mouth every 6 (six) hours as needed for mild pain (or Fever >/= 101). 12/25/13   Erlene Quan, PA-C  ammonium lactate (AMLACTIN) 12 % cream Apply topically 2 (two) times daily. For xerosis    Historical Provider, MD  aspirin EC 81 MG tablet Take 81 mg by mouth every morning.     Historical Provider, MD  cyclobenzaprine (FLEXERIL) 10 MG tablet Take 1 tablet (10 mg total) by mouth 2 (two) times daily as needed for muscle spasms. 10/26/13   Hoy Morn, MD  diphenhydrAMINE (  BENADRYL) 25 mg capsule Take 25 mg by mouth every 6 (six) hours as needed. For allergy.    Historical Provider, MD  diphenoxylate-atropine (LOMOTIL) 2.5-0.025 MG per tablet Take 1 tablet by mouth 4 (four) times daily as needed. Diarrhea    Historical Provider, MD  furosemide (LASIX) 40 MG tablet Take 40 mg by mouth daily.    Historical Provider, MD  HYDROcodone-acetaminophen (NORCO/VICODIN) 5-325 MG per tablet Take 1 tablet by mouth every 4 (four) hours as needed for severe pain. 12/25/13   Erlene Quan, PA-C  insulin NPH-insulin regular (NOVOLIN 70/30) (70-30) 100 UNIT/ML injection Inject 50-60 Units into the skin 2 (two) times daily with a meal.     Historical Provider, MD  lisinopril (PRINIVIL,ZESTRIL) 5 MG tablet Take 1 tablet (5 mg total) by mouth daily. 01/20/14   Candee Furbish, MD  loperamide (IMODIUM A-D) 2 MG tablet Take  2 mg by mouth 4 (four) times daily as needed for diarrhea or loose stools.    Historical Provider, MD  Menthol-Zinc Oxide (CALMOSEPTINE EX) Apply topically daily.    Historical Provider, MD  metoprolol (LOPRESSOR) 50 MG tablet Take 100 mg by mouth daily.    Historical Provider, MD  nitroGLYCERIN (NITROSTAT) 0.4 MG SL tablet Place 0.4 mg under the tongue every 5 (five) minutes as needed for chest pain.    Historical Provider, MD  omeprazole (PRILOSEC) 20 MG capsule Take 20 mg by mouth daily.    Historical Provider, MD  oxybutynin (DITROPAN) 5 MG tablet Take 5 mg by mouth 3 (three) times daily as needed for bladder spasms.    Historical Provider, MD   BP 108/52  Pulse 79  Temp(Src) 98 F (36.7 C) (Oral)  Resp 16  Ht 5\' 10"  (1.778 m)  Wt 370 lb (167.831 kg)  BMI 53.09 kg/m2  SpO2 99% Physical Exam  Nursing note and vitals reviewed. Constitutional: He appears distressed.  Obese   HENT:  Head: Normocephalic and atraumatic.  Right Ear: External ear normal.  Left Ear: External ear normal.  Mouth/Throat: No oropharyngeal exudate.  Eyes: Conjunctivae are normal. Right eye exhibits no discharge. Left eye exhibits no discharge. No scleral icterus.  Neck: Neck supple. No tracheal deviation present.  Cardiovascular: Normal rate, regular rhythm and intact distal pulses.   Pulmonary/Chest: Accessory muscle usage present. No stridor. Tachypnea noted. No respiratory distress. He has no wheezes. He has rales in the right upper field, the right middle field, the right lower field, the left upper field, the left middle field and the left lower field.  Abdominal: Soft. Bowel sounds are normal. He exhibits no distension. There is no tenderness. There is no rebound and no guarding.  Genitourinary:  Cloudy yellow urine in foley bag  Musculoskeletal: He exhibits edema (bilateral le). He exhibits no tenderness.  Neurological: He is alert. He has normal strength. No cranial nerve deficit (no facial droop,  extraocular movements intact, no slurred speech) or sensory deficit. He exhibits normal muscle tone. He displays no seizure activity. Coordination normal.  Skin: Skin is warm and dry. No rash noted.  Psychiatric: He has a normal mood and affect.    ED Course  Procedures (including critical care time) Medications  nitroGLYCERIN 0.2 mg/mL in dextrose 5 % infusion (5 mcg/min Intravenous New Bag/Given 03/21/14 1700)  furosemide (LASIX) injection 60 mg (60 mg Intravenous Given 03/21/14 1652)  potassium chloride SA (K-DUR,KLOR-CON) CR tablet 20 mEq (20 mEq Oral Given 03/21/14 1653)   CRITICAL CARE Performed by: [id number] Total critical  care time: 30 Critical care time was exclusive of separately billable procedures and treating other patients. Critical care was necessary to treat or prevent imminent or life-threatening deterioration. Critical care was time spent personally by me on the following activities: development of treatment plan with patient and/or surrogate as well as nursing, discussions with consultants, evaluation of patient's response to treatment, examination of patient, obtaining history from patient or surrogate, ordering and performing treatments and interventions, ordering and review of laboratory studies, ordering and review of radiographic studies, pulse oximetry and re-evaluation of patient's condition.  1800  Pt feeling much better after treatment.  No crackles noted.  Tolerating Bipap and the ntg drip.   Labs Review Labs Reviewed  COMPREHENSIVE METABOLIC PANEL - Abnormal; Notable for the following:    Sodium 133 (*)    Chloride 93 (*)    Glucose, Bld 338 (*)    Albumin 3.3 (*)    All other components within normal limits  BLOOD GAS, ARTERIAL - Abnormal; Notable for the following:    pCO2 arterial 46.6 (*)    pO2, Arterial 147.0 (*)    Bicarbonate 26.1 (*)    All other components within normal limits  URINALYSIS, ROUTINE W REFLEX MICROSCOPIC - Abnormal; Notable for the  following:    APPearance CLOUDY (*)    Glucose, UA 250 (*)    Hgb urine dipstick LARGE (*)    Protein, ur 100 (*)    Leukocytes, UA MODERATE (*)    All other components within normal limits  URINE MICROSCOPIC-ADD ON - Abnormal; Notable for the following:    Casts HYALINE CASTS (*)    All other components within normal limits  I-STAT CHEM 8, ED - Abnormal; Notable for the following:    Sodium 134 (*)    BUN 5 (*)    Glucose, Bld 347 (*)    Calcium, Ion 1.04 (*)    All other components within normal limits  CBC  MAGNESIUM  PROTIME-INR  PRO B NATRIURETIC PEPTIDE  I-STAT TROPOININ, ED    Imaging Review Dg Chest Port 1 View  03/21/2014   CLINICAL DATA:  Respiratory distress.  Hypertension.  EXAM: PORTABLE CHEST - 1 VIEW  COMPARISON:  Single view of the chest 12/23/2013. PA and lateral chest 10/23/2012.  FINDINGS: There is cardiomegaly and pulmonary edema. No consolidative process, pneumothorax or effusion.  IMPRESSION: Congestive heart failure.   Electronically Signed   By: Inge Rise M.D.   On: 03/21/2014 16:17     EKG Interpretation   Date/Time:  Sunday March 21 2014 15:29:41 EDT Ventricular Rate:  94 PR Interval:  220 QRS Duration: 122 QT Interval:  395 QTC Calculation: 494 R Axis:   -52 Text Interpretation:  Sinus rhythm Prolonged PR interval Left bundle  branch block Baseline wander in lead(s) III increased qrs duration since  last tracing Confirmed by Fisher Hargadon  MD-J, Samayra Hebel [id number]) on 03/21/2014 4:03:19 PM      MDM   Final diagnoses:  Acute congestive heart failure, unspecified congestive heart failure type    The patient's shortness of breath is related to an acute CHF exacerbation. EKG and troponin is not suggestive of myocardial ischemia at this time. We'll continue with nitroglycerin drip and the BiPAP. He has improved significantly. He likely can have the BiPAP discontinued soon. For now he'll be admitted to the step down unit for further treatment  Possible  Foley catheter obstruction. Patient actually has good urine output and his Foley catheter now.  Urinalysis does  show white blood cells as well as many red blood cells. He does have an underlying catheter. This may be colonization. I will send off a urine culture.    Dorie Rank, MD 03/21/14 [phone]

## 2014-03-21 NOTE — H&P (Signed)
[facility] History and Physical  JI FILA [id number] DOB: Apr 13, 1946 DOA: 03/21/2014  Referring physician: Dr. Tomi Bamberger PCP: Irven Shelling, MD   Chief Complaint: Shortness of breath  HPI: [name] is a 68 y.o. male with past medical history of morbid obesity, chronic Foley catheter and chronic diastolic CHF, he came to the hospital initially concerned that his Foley catheter is not draining. While he's in the emergency department he was found to be short of breath, upon further questioning it seems he started to have shortness of breath yesterday, and got worse overnight. He also has some palpitation but denies any chest pain, denies nausea or vomiting. In the ED BNP is pending, his BMP/CBC looks okay, except for his blood sugar of 338, his chest x-ray showed cardiomegaly and pulmonary edema consistent with acute CHF. Patient will be admitted to the hospital for further management.  Review of Systems:  Constitutional: negative for anorexia, fevers and sweats Eyes: negative for irritation, redness and visual disturbance Ears, nose, mouth, throat, and face: negative for earaches, epistaxis, nasal congestion and sore throat Respiratory: negative for cough, dyspnea on exertion, sputum and wheezing Cardiovascular: Shortness of breath and palpitations, lower extremity edema Gastrointestinal: negative for abdominal pain, constipation, diarrhea, melena, nausea and vomiting Genitourinary:negative for dysuria, frequency and hematuria Hematologic/lymphatic: negative for bleeding, easy bruising and lymphadenopathy Musculoskeletal:negative for arthralgias, muscle weakness and stiff joints Neurological: negative for coordination problems, gait problems, headaches and weakness Endocrine: negative for diabetic symptoms including polydipsia, polyuria and weight loss Allergic/Immunologic: negative for anaphylaxis, hay fever and urticaria  Past Medical History  Diagnosis Date    . Obese   . Hypertension   . Diabetes mellitus   . Diabetic diarrhea   . Sleep apnea   . Diabetic neuropathy, painful   . Chronic indwelling Foley catheter   . Bladder spasms   . Complication of anesthesia   . PONV (postoperative nausea and vomiting)   . Hyperlipidemia   . GERD (gastroesophageal reflux disease)   . Neurogenic bladder   . Stroke 2013   Past Surgical History  Procedure Laterality Date  . Right tkr    . Gastroplasty    . Cholecystectomy    . Total knee arthroplasty     Social History:   reports that he quit smoking about 37 years ago. He has never used smokeless tobacco. He reports that he does not drink alcohol or use illicit drugs.  Allergies  Allergen Reactions  . Ace Inhibitors Swelling  . Cefadroxil Hives  . Cephalexin Hives  . Lipitor [Atorvastatin Calcium] Swelling  . Metformin And Related Swelling  . Methocarbamol Other (See Comments)    Feels like is in "rocky boat"  . Morphine And Related Other (See Comments)    Sweating, feels like is "in rocky boat."  . Robaxin [Methocarbamol]     Feels like he is shaky    Family History  Problem Relation Age of Onset  . Hypertension Mother   . Diabetes type I Father   . Cancer Brother      Testicular Cancer  . Cancer Brother     Leukemia     Prior to Admission medications   Medication Sig Start Date End Date Taking? Authorizing Provider  acetaminophen (TYLENOL) 325 MG tablet Take 2 tablets (650 mg total) by mouth every 6 (six) hours as needed for mild pain (or Fever >/= 101). 12/25/13   Erlene Quan, PA-C  ammonium lactate (AMLACTIN) 12 % cream Apply topically 2 (two)  times daily. For xerosis    Historical Provider, MD  aspirin EC 81 MG tablet Take 81 mg by mouth every morning.     Historical Provider, MD  cyclobenzaprine (FLEXERIL) 10 MG tablet Take 1 tablet (10 mg total) by mouth 2 (two) times daily as needed for muscle spasms. 10/26/13   Hoy Morn, MD  diphenhydrAMINE (BENADRYL) 25 mg capsule  Take 25 mg by mouth every 6 (six) hours as needed. For allergy.    Historical Provider, MD  diphenoxylate-atropine (LOMOTIL) 2.5-0.025 MG per tablet Take 1 tablet by mouth 4 (four) times daily as needed. Diarrhea    Historical Provider, MD  furosemide (LASIX) 40 MG tablet Take 40 mg by mouth daily.    Historical Provider, MD  HYDROcodone-acetaminophen (NORCO/VICODIN) 5-325 MG per tablet Take 1 tablet by mouth every 4 (four) hours as needed for severe pain. 12/25/13   Erlene Quan, PA-C  insulin NPH-insulin regular (NOVOLIN 70/30) (70-30) 100 UNIT/ML injection Inject 50-60 Units into the skin 2 (two) times daily with a meal.     Historical Provider, MD  lisinopril (PRINIVIL,ZESTRIL) 5 MG tablet Take 1 tablet (5 mg total) by mouth daily. 01/20/14   Candee Furbish, MD  loperamide (IMODIUM A-D) 2 MG tablet Take 2 mg by mouth 4 (four) times daily as needed for diarrhea or loose stools.    Historical Provider, MD  Menthol-Zinc Oxide (CALMOSEPTINE EX) Apply topically daily.    Historical Provider, MD  metoprolol (LOPRESSOR) 50 MG tablet Take 100 mg by mouth daily.    Historical Provider, MD  nitroGLYCERIN (NITROSTAT) 0.4 MG SL tablet Place 0.4 mg under the tongue every 5 (five) minutes as needed for chest pain.    Historical Provider, MD  omeprazole (PRILOSEC) 20 MG capsule Take 20 mg by mouth daily.    Historical Provider, MD  oxybutynin (DITROPAN) 5 MG tablet Take 5 mg by mouth 3 (three) times daily as needed for bladder spasms.    Historical Provider, MD   Physical Exam: Filed Vitals:   03/21/14 1800  BP: 111/56  Pulse: 79  Temp:   Resp: 13   Constitutional: Oriented to person, place, and time. Well-developed and well-nourished. Cooperative. On BiPAP  Head: Normocephalic and atraumatic.  Nose: Nose normal.  Mouth/Throat: Uvula is midline, oropharynx is clear and moist and mucous membranes are normal.  Eyes: Conjunctivae and EOM are normal. Pupils are equal, round, and reactive to light.  Neck:  Trachea normal and normal range of motion. Neck supple.  Cardiovascular: Normal rate, regular rhythm, S1 normal, S2 normal, normal heart sounds and intact distal pulses.   Pulmonary/Chest: Effort normal and breath sounds normal.  Abdominal: Soft. Bowel sounds are normal. There is no hepatosplenomegaly. There is no tenderness.  Musculoskeletal: Normal range of motion.  Neurological: Alert and oriented to person, place, and time. He has normal strength. Rest of exam is deferred  Skin: Skin is warm, dry and intact.  Psychiatric: Has a normal mood and affect. Speech is normal and behavior is normal.   Labs on Admission:  Basic Metabolic Panel:  Recent Labs Lab 03/21/14 1640 03/21/14 1654  NA 133* 134*  K 4.9 5.1  CL 93* 97  CO2 25  --   GLUCOSE 338* 347*  BUN 6 5*  CREATININE 0.57 0.60  CALCIUM 8.9  --   MG 1.8  --    Liver Function Tests:  Recent Labs Lab 03/21/14 1640  AST 33  ALT 14  ALKPHOS 91  BILITOT 0.8  PROT 7.1  ALBUMIN 3.3*   No results found for this basename: LIPASE, AMYLASE,  in the last 168 hours No results found for this basename: AMMONIA,  in the last 168 hours CBC:  Recent Labs Lab 03/21/14 1640 03/21/14 1654  WBC 9.8  --   HGB 15.9 16.7  HCT 45.6 49.0  MCV 89.4  --   PLT 264  --    Cardiac Enzymes: No results found for this basename: CKTOTAL, CKMB, CKMBINDEX, TROPONINI,  in the last 168 hours  BNP (last 3 results)  Recent Labs  12/23/13 0936  PROBNP 1247.0*   CBG: No results found for this basename: GLUCAP,  in the last 168 hours  Radiological Exams on Admission: Dg Chest Port 1 View  03/21/2014   CLINICAL DATA:  Respiratory distress.  Hypertension.  EXAM: PORTABLE CHEST - 1 VIEW  COMPARISON:  Single view of the chest 12/23/2013. PA and lateral chest 10/23/2012.  FINDINGS: There is cardiomegaly and pulmonary edema. No consolidative process, pneumothorax or effusion.  IMPRESSION: Congestive heart failure.   Electronically Signed   By:  Inge Rise M.D.   On: 03/21/2014 16:17    EKG: Independently reviewed.   Assessment/Plan Principal Problem:   Acute on chronic diastolic CHF (congestive heart failure) Active Problems:   DM (diabetes mellitus) with complications   Polyneuropathy in diabetes(357.2)   OSA (obstructive sleep apnea)   Hypertension   Chronic indwelling Foley catheter   CHF (congestive heart failure)    Acute on chronic diastolic CHF -Patient has recent echocardiogram about 3 months ago which showed LVEF of 55-60%. -This is CHF with preserved LVEF. -Patient started on aggressive diuresis with 80 mg of Lasix twice a day. -Continue his beta blockers and lisinopril, restrict his fluid input to 1200 mL per day. -Monitor urine outputs, daily weights and follow clinically to adjust diuretics dose.  Diabetes mellitus with complications -Patient presented with hyperglycemia, glucose of 338, no evidence of acidosis or hyperosmolarity. -Patient started on his 70/30 insulin mix at 50 units twice a day, insulin sliding scale added. -Last hemoglobin A1c from April was 11.2 which correlate with mean plasma glucose of 275. Check A1c.  Chronic indwelling Foley catheter -Urine showed moderate LE and 21-50 WBC, but he does not have any symptoms, likely this is chronic. -I will not start antibiotics, I sent the urine for culture if it's positive then antibiotics will be warranted.  Hypertension -Blood pressure controlled, continue Toprol-XL and lisinopril.  Pulmonary edema -Secondary to acute on chronic diastolic CHF, patient placed on BiPAP. -I think you'll benefit from staying overnight in the step down.  Morbid obesity -Counseled extensively, about weight loss.  Code Status: Full code Family Communication: Plan discussed with the patient Disposition Plan: Stepdown, inpatient.  Time spent: 70 minutes  [facility] Pager [phone]

## 2014-03-22 DIAGNOSIS — I509 Heart failure, unspecified: Secondary | ICD-10-CM | POA: Diagnosis not present

## 2014-03-22 DIAGNOSIS — I10 Essential (primary) hypertension: Secondary | ICD-10-CM | POA: Diagnosis not present

## 2014-03-22 DIAGNOSIS — E119 Type 2 diabetes mellitus without complications: Secondary | ICD-10-CM | POA: Diagnosis not present

## 2014-03-22 LAB — BASIC METABOLIC PANEL
ANION GAP: 14 (ref 5–15)
BUN: 7 mg/dL (ref 6–23)
CALCIUM: 8.5 mg/dL (ref 8.4–10.5)
CO2: 26 mEq/L (ref 19–32)
CREATININE: 0.71 mg/dL (ref 0.50–1.35)
Chloride: 92 mEq/L — ABNORMAL LOW (ref 96–112)
Glucose, Bld: 365 mg/dL — ABNORMAL HIGH (ref 70–99)
Potassium: 3.9 mEq/L (ref 3.7–5.3)
SODIUM: 132 meq/L — AB (ref 137–147)

## 2014-03-22 LAB — CBC
HCT: 41.5 % (ref 39.0–52.0)
Hemoglobin: 14.3 g/dL (ref 13.0–17.0)
MCH: 30.6 pg (ref 26.0–34.0)
MCHC: 34.5 g/dL (ref 30.0–36.0)
MCV: 88.9 fL (ref 78.0–100.0)
PLATELETS: 242 10*3/uL (ref 150–400)
RBC: 4.67 MIL/uL (ref 4.22–5.81)
RDW: 13.7 % (ref 11.5–15.5)
WBC: 9.3 10*3/uL (ref 4.0–10.5)

## 2014-03-22 LAB — TROPONIN I

## 2014-03-22 LAB — GLUCOSE, CAPILLARY
GLUCOSE-CAPILLARY: 270 mg/dL — AB (ref 70–99)
GLUCOSE-CAPILLARY: 359 mg/dL — AB (ref 70–99)
Glucose-Capillary: 147 mg/dL — ABNORMAL HIGH (ref 70–99)
Glucose-Capillary: 234 mg/dL — ABNORMAL HIGH (ref 70–99)

## 2014-03-22 LAB — HEMOGLOBIN A1C
HEMOGLOBIN A1C: 9.3 % — AB (ref <5.7)
MEAN PLASMA GLUCOSE: 220 mg/dL — AB (ref <117)

## 2014-03-22 MED ORDER — METOPROLOL TARTRATE 50 MG PO TABS
50.0000 mg | ORAL_TABLET | Freq: Two times a day (BID) | ORAL | Status: DC
  Administered 2014-03-22 – 2014-03-24 (×4): 50 mg via ORAL
  Filled 2014-03-22 (×4): qty 1
  Filled 2014-03-22: qty 2
  Filled 2014-03-22: qty 1

## 2014-03-22 MED ORDER — DIPHENHYDRAMINE HCL 25 MG PO CAPS
25.0000 mg | ORAL_CAPSULE | Freq: Four times a day (QID) | ORAL | Status: DC | PRN
  Administered 2014-03-22 – 2014-03-24 (×2): 25 mg via ORAL
  Filled 2014-03-22 (×2): qty 1

## 2014-03-22 MED ORDER — DIPHENOXYLATE-ATROPINE 2.5-0.025 MG PO TABS
1.0000 | ORAL_TABLET | Freq: Four times a day (QID) | ORAL | Status: DC | PRN
  Administered 2014-03-22: 1 via ORAL
  Filled 2014-03-22: qty 1

## 2014-03-22 MED ORDER — POTASSIUM CHLORIDE CRYS ER 20 MEQ PO TBCR
20.0000 meq | EXTENDED_RELEASE_TABLET | Freq: Two times a day (BID) | ORAL | Status: DC
  Administered 2014-03-22 – 2014-03-23 (×4): 20 meq via ORAL
  Filled 2014-03-22 (×6): qty 1

## 2014-03-22 NOTE — Progress Notes (Signed)
[id number]/Rhonda Davis, RN, BSN, CCM  [phone]  Chart Reviewed for discharge and hospital needs.  Discharge needs at time of review: None present will follow for needs.  Review of patient progress due on [id number] 

## 2014-03-22 NOTE — Progress Notes (Signed)
Clinical Social Work Department CLINICAL SOCIAL WORK PLACEMENT NOTE 03/22/2014  Patient:  [name], [name]  Account Number:  [id number] Admit date:  03/21/2014  Clinical Social Worker:  Ulyess Blossom  Date/time:  03/22/2014 04:43 PM  Clinical Social Work is seeking post-discharge placement for this patient at the following level of care:   SKILLED NURSING   (*[name] will update this form in Epic as items are completed)   03/22/2014  Patient/family provided with [place] Department of Clinical Social Work's list of facilities offering this level of care within the geographic area requested by the patient (or if unable, by the patient's family).  03/22/2014  Patient/family informed of their freedom to choose among providers that offer the needed level of care, that participate in Medicare, Medicaid or managed care program needed by the patient, have an available bed and are willing to accept the patient.  03/22/2014  Patient/family informed of [facility]' ownership interest in [facility], as well as of the fact that they are under no obligation to receive care at this facility.  PASARR submitted to EDS on 03/22/2014 PASARR number received on 03/22/2014  FL2 transmitted to all facilities in geographic area requested by pt/family on  03/22/2014 FL2 transmitted to all facilities within larger geographic area on   Patient informed that his/her managed care company has contracts with or will negotiate with  certain facilities, including the following:     Patient/family informed of bed offers received:   Patient chooses bed at  Physician recommends and patient chooses bed at    Patient to be transferred to  on   Patient to be transferred to facility by  Patient and family notified of transfer on  Name of family member notified:    The following physician request were entered in Epic:   Additional Comments:   [name], MSW, [place]  Work [phone]

## 2014-03-22 NOTE — Progress Notes (Signed)
Clinical Social Work Department BRIEF PSYCHOSOCIAL ASSESSMENT 03/22/2014  Patient:  [name], [name]     Account Number:  [id number]     Admit date:  03/21/2014  Clinical Social Worker:  Ulyess Blossom  Date/Time:  03/22/2014 04:00 PM  Referred by:  Physician  Date Referred:  03/22/2014 Referred for  SNF Placement   Other Referral:   Interview type:  Patient Other interview type:    PSYCHOSOCIAL DATA Living Status:  ALONE Admitted from facility:   Level of care:   Primary support name:  Gerald Stabs Whitting/friend/[phone] Primary support relationship to patient:  FRIEND Degree of support available:   lacking, MD reports lack of social support    CURRENT CONCERNS Current Concerns  Post-Acute Placement   Other Concerns:    SOCIAL WORK ASSESSMENT / PLAN [name] received referral that pt lives alone and lacks social support.    [name] reviewed chart and noted that PT recommends SNF placement.    [name] met with pt at bedside. [name] introduced self and explained role. Pt confirmed that he lived alone. [name] discussed PT evaluation and recommendation for short term rehab at [facility]. Pt agreeable and recognized that he would be unable to care for himself at home. Pt shared he had been to rehab at [facility] in April, but had to go to a facility in [place] due to insurance not covering. Pt reports that he has Medicare primary and AARP secondary. [name] reviewed chart and noted that pt admission in April, pt was only in Observation and needed three night inpatient stay for Medicare coverage for SNF. [name] discussed with pt that pt currently meeting inpatient criteria, but same would apply during this admission for pt needing three night inpatient stay for SNF to cover placement. Pt expressed understanding and hopeful for three night inpatient stay in order to get to rehab at SNF and expressed motivation to have more therapy during this rehab stay.    [name] completed FL2 and initiated SNF search to [facility].    [name] to follow up with pt regarding SNF bed offers.    [name] to continue to follow and facilitate pt discharge needs when pt medically ready for discharge.   Assessment/plan status:  Psychosocial Support/Ongoing Assessment of Needs Other assessment/ plan:   discharge planning   Information/referral to community resources:   [facility] search    PATIENT'S/FAMILY'S RESPONSE TO PLAN OF CARE: Pt alert and oriented x 4. Pt pleasant and appreciative of [name] visit. Pt recognizes current functional limitations and motivated to go to rehab at SNF in order to get stronger and return home.    [name], MSW, [place] Work [phone]

## 2014-03-22 NOTE — Progress Notes (Signed)
Nutrition Brief Note  Patient identified on the Malnutrition Screening Tool (MST) Report  Wt Readings from Last 15 Encounters:  03/22/14 362 lb 3.5 oz (164.3 kg)  01/20/14 369 lb (167.377 kg)  01/19/14 369 lb (167.377 kg)  12/25/13 371 lb 12.8 oz (168.647 kg)  10/24/12 378 lb 1.6 oz (171.505 kg)  03/30/12 372 lb 5.7 oz (168.9 kg)  03/11/12 378 lb 5 oz (171.6 kg)  11/17/11 392 lb 6.7 oz (178 kg)  11/12/11 384 lb (174.181 kg)  10/24/11 382 lb 3.2 oz (173.365 kg)  10/24/11 382 lb 3.2 oz (173.365 kg)  10/24/11 382 lb 3.2 oz (173.365 kg)  10/24/11 382 lb 3.2 oz (173.365 kg)  10/10/11 389 lb 5.3 oz (176.6 kg)    Body mass index is 50.54 kg/(m^2). Patient meets criteria for extreme obesity based on current BMI.   Current diet order is CHO MOD, patient is consuming approximately 100% of meals at this time. Labs and medications reviewed.   Difficult living situation.  Patient lives alone.  Drives but has been unable to get into the store to use the scooter to shop.  Lives on food that he gets through [email or web address] and [place] as well as food that a friend, Gerald Stabs, brings over about once a month.  Overall very little fresh food.  Has a friend, Abigail Butts, who lives in [place] but she is unable to assist secondary to caring for a disabled child.    Patient's diet is high in sodium.  Tries to avoid concentrated sweets and drinks water, diet soda, and unsweetened tea.  Patient spoke with RD last admit and patient reported that he was a foodaholic.  CHO MOD diet education at that time.    Educated patient on a low sodium diet and need to increase protein.  Patient has lost about 9 lbs in the past 3 months.  Handout provided on low sodium diet.  Teach back method used.  Compliance will be difficult with current living situation.   If nutrition issues arise, please consult RD.   Antonieta Iba, RD, LDN Clinical Inpatient Dietitian Pager:  [phone] Weekend and after hours pager:  [phone]

## 2014-03-22 NOTE — Progress Notes (Signed)
Subjective: Mild  SOB  Objective: Vital signs in last 24 hours: Temp:  [98 F (36.7 C)-98.3 F (36.8 C)] 98.3 F (36.8 C) (07/13 0400) Pulse Rate:  [71-86] 72 (07/13 0500) Resp:  [11-26] 18 (07/13 0500) BP: (108-186)/(47-103) 134/54 mmHg (07/13 0500) SpO2:  [91 %-100 %] 97 % (07/13 0500) Weight:  [167.831 kg (370 lb)-168.8 kg (372 lb 2.2 oz)] 168.8 kg (372 lb 2.2 oz) (07/13 0400) Weight change:  Last BM Date: 03/20/14 (per pt report)  Intake/Output from previous day: 07/12 0701 - 07/13 0700 In: 116.4 [I.V.:116.4] Out: 5400 [Urine:5400] Intake/Output this shift: Total I/O In: 104.9 [I.V.:104.9] Out: 3900 [Urine:3900]  General appearance: alert and cooperative Resp: clear to auscultation bilaterally Cardio: regular rate and rhythm, S1, S2 normal, no murmur, click, rub or gallop GI: soft, non-tender; bowel sounds normal; no masses,  no organomegaly Extremities: edema 2+  Lab Results:  Recent Labs  03/21/14 1640 03/21/14 1654 03/22/14 0116  WBC 9.8  --  9.3  HGB 15.9 16.7 14.3  HCT 45.6 49.0 41.5  PLT 264  --  242   BMET  Recent Labs  03/21/14 1640 03/21/14 1654 03/22/14 0116  NA 133* 134* 132*  K 4.9 5.1 3.9  CL 93* 97 92*  CO2 25  --  26  GLUCOSE 338* 347* 365*  BUN 6 5* 7  CREATININE 0.57 0.60 0.71  CALCIUM 8.9  --  8.5    Studies/Results: Dg Chest Port 1 View  03/21/2014   CLINICAL DATA:  Respiratory distress.  Hypertension.  EXAM: PORTABLE CHEST - 1 VIEW  COMPARISON:  Single view of the chest 12/23/2013. PA and lateral chest 10/23/2012.  FINDINGS: There is cardiomegaly and pulmonary edema. No consolidative process, pneumothorax or effusion.  IMPRESSION: Congestive heart failure.   Electronically Signed   By: Inge Rise M.D.   On: 03/21/2014 16:17    Medications: I have reviewed the patient's current medications.  Assessment/Plan: Principal Problem:   Acute on chronic diastolic CHF (congestive heart failure) diuresing well continue lasix  iv Active Problems:   DM (diabetes mellitus) with complications poorly controlled   OSA (obstructive sleep apnea) presumed but has never allowed workup   Hypertension ok   Chronic indwelling Foley catheter   Disposition, very difficult issue, lives alone with little support, has declined [facility] assistance in past. PT consult, SW consult.    LOS: 1 day   [facility] 03/22/2014, 6:53 AM

## 2014-03-22 NOTE — Progress Notes (Addendum)
03/22/14 0900-Nsg note- Pt was assisted OOB to [facility] per request; Pt reported dizziness and lightheadedness when he stood up. Vitals stayed stable- BP did not change and HR varied 100-140s with activity. Will notify MD. Pt stayed up on [facility] with no loss of consciousness. 2 assist back to bed. A&Ox4. Vitals remain stable, no signs of distress at this time. Will continue to monitor pt. 0945- MD notified- no new orders and pt is okay to transfer to tele.

## 2014-03-22 NOTE — Evaluation (Signed)
Physical Therapy Evaluation Patient Details Name: [name] MRN: [id number] DOB: [birth year] Today's Date: 03/22/2014   History of Present Illness  [name] is a 68 y.o. male with past medical history of morbid obesity, chronic Foley catheter and chronic diastolic CHF, he came to the hospital 03/21/14 initially concerned that his Foley catheter is not draining.While in the emergency department he was found to be short of breath. Also hyperglycemic.  Clinical Impression  Pt presents with inconsistencies in  His presentation in that he becomes imbalanced and "dizzy" when looking down at times but was not when  Testing LE sensation and pt looking toward feet. Pt also demonstrated poor finger opposition, repetitive movements on R vs norm on L, although pt readily reached up and grabbed trapeze with R hand and assisted  Self  In sliding to [facility], also reached R UE over to L rail to roll to L. Pt also had difficult following with eyes as object moved L to R. Pt would not follow to R past midline .Recommend OT consult to further test RUE and visual inconsistencies. Will assess for vestibular impairments , also. Pt will benefit from PT to address problems listed in note below.    Follow Up Recommendations SNF;Supervision/Assistance - 24 hour    Equipment Recommendations       Recommendations for Other Services OT consult     Precautions / Restrictions Precautions Precautions: Fall Precaution Comments: had a "dizzy" spell with nursing when going to [facility], will "just fall back"      Mobility  Bed Mobility Overal bed mobility: Needs Assistance;+2 for physical assistance;+ 2 for safety/equipment Bed Mobility: Rolling;Supine to Sit;Sit to Supine Rolling: Min guard   Supine to sit: +2 for safety/equipment;Min assist;HOB elevated Sit to supine: +2 for safety/equipment;Mod assist   General bed mobility comments: assist for legs onto bed.  Transfers                 General transfer  comment: did not attempt  due to nursing reported difficulty in am  with transfer to/from [facility], reports of dizziness and decreased trunk control  Ambulation/Gait                Stairs            Wheelchair Mobility    Modified Rankin (Stroke Patients Only)       Balance Overall balance assessment: Needs assistance;History of Falls Sitting-balance support: Feet supported;Bilateral upper extremity supported;Single extremity supported Sitting balance-Leahy Scale: Poor Sitting balance - Comments: noted that pt. loses balance if he looks downward, his trunk with sway posteriorly, his eyes widen. encouraged to focuas on point in front. While testing his legs, pt was looking down to floor and did not appear to have the Trunk imbalance.                                     Pertinent Vitals/Pain No c/o pain.     Home Living Family/patient expects to be discharged to:: Private residence Living Arrangements: Alone Available Help at Discharge: Friend(s);Available PRN/intermittently   Home Access: Stairs to enter Entrance Stairs-Rails: Right;Left Entrance Stairs-Number of Steps: 1 Home Layout: One level Home Equipment: Cane - single point;Grab bars - toilet;Grab bars - tub/shower;Electric scooter;Wheelchair - manual      Prior Function Level of Independence: Independent with assistive device(s)  Hand Dominance        Extremity/Trunk Assessment   Upper Extremity Assessment: RUE deficits/detail RUE Deficits / Details: pt  presented with decreased finger to nose  , decreased finger opposition- appeared pt having much difficulty  with fine motor control. THEN, when asked to assist to slide up in bed pt reached up for trapeze without dely and pulled up on it to slide up. Pt also noted to reach over to L  rail rail without hesitation to roll to L.         Lower Extremity Assessment: Generalized weakness      Cervical / Trunk Assessment:  Normal  Communication   Communication: No difficulties  Cognition Arousal/Alertness: Awake/alert Behavior During Therapy: WFL for tasks assessed/performed Overall Cognitive Status: Impaired/Different from baseline Area of Impairment: Orientation Orientation Level: Time;Situation                  General Comments      Exercises        Assessment/Plan    PT Assessment Patient needs continued PT services  PT Diagnosis Generalized weakness;Difficulty walking   PT Problem List Decreased strength;Decreased activity tolerance;Decreased balance;Decreased mobility;Decreased knowledge of precautions;Decreased safety awareness;Decreased knowledge of use of DME;Obesity  PT Treatment Interventions DME instruction;Gait training;Functional mobility training;Therapeutic exercise;Patient/family education   PT Goals (Current goals can be found in the Care Plan section) Acute Rehab PT Goals Patient Stated Goal: I want to not have this dizziness PT Goal Formulation: With patient Time For Goal Achievement: 04/05/14 Potential to Achieve Goals: Good    Frequency Min 3X/week   Barriers to discharge Decreased caregiver support      Co-evaluation               End of Session   Activity Tolerance: Patient tolerated treatment well Patient left: with call bell/phone within reach;with bed alarm set Nurse Communication: Mobility status         Time: 1431-1455 PT Time Calculation (min): 24 min   Charges:   PT Evaluation $Initial PT Evaluation Tier I: 1 Procedure PT Treatments $Therapeutic Activity: 23-37 mins   PT G Codes:          Claretha Cooper 03/22/2014, 3:33 PM Tresa Endo PT [phone]

## 2014-03-22 NOTE — Progress Notes (Signed)
[name] received referral that pt lives alone and has little social support.  [name] noted PT consulted and [name] to await PT evaluation in order to assist as appropriate with discharge planning needs.   [name] to continue to follow.  [name], MSW, [place] Work [phone]

## 2014-03-23 ENCOUNTER — Inpatient Hospital Stay (HOSPITAL_COMMUNITY): Payer: Medicare Other

## 2014-03-23 DIAGNOSIS — I10 Essential (primary) hypertension: Secondary | ICD-10-CM | POA: Diagnosis not present

## 2014-03-23 DIAGNOSIS — I509 Heart failure, unspecified: Secondary | ICD-10-CM | POA: Diagnosis not present

## 2014-03-23 DIAGNOSIS — R918 Other nonspecific abnormal finding of lung field: Secondary | ICD-10-CM | POA: Diagnosis not present

## 2014-03-23 DIAGNOSIS — E119 Type 2 diabetes mellitus without complications: Secondary | ICD-10-CM | POA: Diagnosis not present

## 2014-03-23 LAB — GLUCOSE, CAPILLARY
GLUCOSE-CAPILLARY: 185 mg/dL — AB (ref 70–99)
GLUCOSE-CAPILLARY: 204 mg/dL — AB (ref 70–99)
GLUCOSE-CAPILLARY: 178 mg/dL — AB (ref 70–99)
Glucose-Capillary: 133 mg/dL — ABNORMAL HIGH (ref 70–99)
Glucose-Capillary: 135 mg/dL — ABNORMAL HIGH (ref 70–99)

## 2014-03-23 LAB — BASIC METABOLIC PANEL
Anion gap: 9 (ref 5–15)
BUN: 12 mg/dL (ref 6–23)
CALCIUM: 8.9 mg/dL (ref 8.4–10.5)
CO2: 31 meq/L (ref 19–32)
CREATININE: 0.89 mg/dL (ref 0.50–1.35)
Chloride: 96 mEq/L (ref 96–112)
GFR calc Af Amer: >90 mL/min (ref >90)
GFR, EST NON AFRICAN AMERICAN: 87 mL/min — AB (ref >90)
Glucose, Bld: 208 mg/dL — ABNORMAL HIGH (ref 70–99)
Potassium: 3.8 mEq/L (ref 3.7–5.3)
SODIUM: 136 meq/L — AB (ref 137–147)

## 2014-03-23 LAB — URINE CULTURE

## 2014-03-23 MED ORDER — POLYETHYLENE GLYCOL 3350 17 G PO PACK
17.0000 g | PACK | Freq: Once | ORAL | Status: AC
  Administered 2014-03-23: 17 g via ORAL
  Filled 2014-03-23: qty 1

## 2014-03-23 MED ORDER — INSULIN ASPART PROT & ASPART (70-30 MIX) 100 UNIT/ML ~~LOC~~ SUSP
55.0000 [IU] | Freq: Two times a day (BID) | SUBCUTANEOUS | Status: DC
  Administered 2014-03-23 – 2014-03-24 (×3): 55 [IU] via SUBCUTANEOUS

## 2014-03-23 MED ORDER — FUROSEMIDE 40 MG PO TABS
40.0000 mg | ORAL_TABLET | Freq: Two times a day (BID) | ORAL | Status: DC
  Filled 2014-03-23 (×3): qty 1

## 2014-03-23 MED ORDER — FUROSEMIDE 80 MG PO TABS
80.0000 mg | ORAL_TABLET | Freq: Two times a day (BID) | ORAL | Status: DC
  Administered 2014-03-23 – 2014-03-24 (×3): 80 mg via ORAL
  Filled 2014-03-23 (×5): qty 1

## 2014-03-23 MED ORDER — FUROSEMIDE 80 MG PO TABS
80.0000 mg | ORAL_TABLET | Freq: Two times a day (BID) | ORAL | Status: DC
  Filled 2014-03-23 (×4): qty 1

## 2014-03-23 NOTE — Care Management Note (Signed)
    Page 1 of 2   03/23/2014     5:53:16 PM CARE MANAGEMENT NOTE 03/23/2014  Patient:  [name], [name]   Account Number:  [id number]  Date Initiated:  03/22/2014  Documentation initiated by:  DAVIS,RHONDA  Subjective/Objective Assessment:   pt with confirmed chf and dyspnea, glucose over 350, iv nitro pm of [id number] lasix 80 mg bid, telemtery     Action/Plan:   pt lives alone. has refused [facility] in the past.   Anticipated DC Date:  03/25/2014   Anticipated DC Plan:  SKILLED NURSING FACILITY  In-house referral  Clinical Social Worker      DC Planning Services  CM consult      [facility] Choice  NA   Choice offered to / List presented to:  NA   DME arranged  NA      DME agency  NA     [place] arranged  NA      [place] agency  NA   Status of service:  In process, will continue to follow Medicare Important Message given?  NA - LOS <3 / Initial given by admissions (If response is "NO", the following Medicare IM given date fields will be blank) Date Medicare IM given:   Medicare IM given by:   Date Additional Medicare IM given:   Additional Medicare IM given by:    Discharge Disposition:    Per UR Regulation:  Reviewed for med. necessity/level of care/duration of stay  If discussed at [place] of Stay Meetings, dates discussed:    Comments:  03/23/14 Alveena Taira RN,BSN NCM [place].D/C PLAN SNF.  [id number] Rosana Hoes, RN, BSN, [place]  [phone]  Chart Reviewed for discharge and hospital needs.  Discharge needs at time of review: None present will follow for needs.  Review of patient progress due on [id number]

## 2014-03-23 NOTE — Progress Notes (Signed)
[name] provided & reviewed bed offers with patient who accepted @ [facility]. Anticipating possible discharge tomorrow.   [name], [facility] Clinical Social Worker cell #: [phone]

## 2014-03-23 NOTE — Progress Notes (Signed)
Subjective: Feels much better, less SOB  Objective: Vital signs in last 24 hours: Temp:  [97.3 F (36.3 C)-98.6 F (37 C)] 97.3 F (36.3 C) (07/14 0515) Pulse Rate:  [67-115] 67 (07/14 0515) Resp:  [13-22] 18 (07/14 0515) BP: (92-140)/(48-91) 138/60 mmHg (07/14 0515) SpO2:  [95 %-99 %] 95 % (07/14 0515) Weight:  [163 kg (359 lb 5.6 oz)-164.3 kg (362 lb 3.5 oz)] 163 kg (359 lb 5.6 oz) (07/14 0452) Weight change: -3.531 kg (-7 lb 12.6 oz) Last BM Date: 03/22/14  Intake/Output from previous day: 07/13 0701 - 07/14 0700 In: 1000 [P.O.:960; I.V.:40] Out: 3290 [Urine:3290] Intake/Output this shift:    General appearance: alert and cooperative Resp: clear to auscultation bilaterally Cardio: regular rate and rhythm, S1, S2 normal, no murmur, click, rub or gallop Extremities: edema trace  Lab Results:  Recent Labs  03/21/14 1640 03/21/14 1654 03/22/14 0116  WBC 9.8  --  9.3  HGB 15.9 16.7 14.3  HCT 45.6 49.0 41.5  PLT 264  --  242   BMET  Recent Labs  03/22/14 0116 03/23/14 0407  NA 132* 136*  K 3.9 3.8  CL 92* 96  CO2 26 31  GLUCOSE 365* 208*  BUN 7 12  CREATININE 0.71 0.89  CALCIUM 8.5 8.9    Studies/Results: Dg Chest Port 1 View  03/21/2014   CLINICAL DATA:  Respiratory distress.  Hypertension.  EXAM: PORTABLE CHEST - 1 VIEW  COMPARISON:  Single view of the chest 12/23/2013. PA and lateral chest 10/23/2012.  FINDINGS: There is cardiomegaly and pulmonary edema. No consolidative process, pneumothorax or effusion.  IMPRESSION: Congestive heart failure.   Electronically Signed   By: Inge Rise M.D.   On: 03/21/2014 16:17    Medications: I have reviewed the patient's current medications.  Assessment/Plan: Principal Problem:  Acute on chronic diastolic CHF (congestive heart failure) diuresing well and feeling much better, change to po lasix and recheck cxr. Active Problems:  DM (diabetes mellitus) with complications poorly controlled increase insulin  dose OSA (obstructive sleep apnea) presumed but has never allowed workup  Hypertension BP low, dizzy, hold lisinipril and check orthostatics Chronic indwelling Foley catheter  Disposition, SW and PT noted, [facility] expect discharge on 7/15.   LOS: 2 days   Willem Klingensmith JOSEPH 03/23/2014, 7:20 AM

## 2014-03-24 DIAGNOSIS — I10 Essential (primary) hypertension: Secondary | ICD-10-CM | POA: Diagnosis present

## 2014-03-24 DIAGNOSIS — Z794 Long term (current) use of insulin: Secondary | ICD-10-CM | POA: Diagnosis not present

## 2014-03-24 DIAGNOSIS — E1149 Type 2 diabetes mellitus with other diabetic neurological complication: Secondary | ICD-10-CM | POA: Diagnosis present

## 2014-03-24 DIAGNOSIS — E1142 Type 2 diabetes mellitus with diabetic polyneuropathy: Secondary | ICD-10-CM | POA: Diagnosis not present

## 2014-03-24 DIAGNOSIS — R079 Chest pain, unspecified: Secondary | ICD-10-CM | POA: Diagnosis not present

## 2014-03-24 DIAGNOSIS — R262 Difficulty in walking, not elsewhere classified: Secondary | ICD-10-CM | POA: Diagnosis not present

## 2014-03-24 DIAGNOSIS — Z96659 Presence of unspecified artificial knee joint: Secondary | ICD-10-CM | POA: Diagnosis not present

## 2014-03-24 DIAGNOSIS — Z833 Family history of diabetes mellitus: Secondary | ICD-10-CM | POA: Diagnosis not present

## 2014-03-24 DIAGNOSIS — J984 Other disorders of lung: Secondary | ICD-10-CM | POA: Diagnosis not present

## 2014-03-24 DIAGNOSIS — R0789 Other chest pain: Secondary | ICD-10-CM | POA: Diagnosis present

## 2014-03-24 DIAGNOSIS — Z87891 Personal history of nicotine dependence: Secondary | ICD-10-CM | POA: Diagnosis not present

## 2014-03-24 DIAGNOSIS — Z8673 Personal history of transient ischemic attack (TIA), and cerebral infarction without residual deficits: Secondary | ICD-10-CM | POA: Diagnosis not present

## 2014-03-24 DIAGNOSIS — I509 Heart failure, unspecified: Secondary | ICD-10-CM | POA: Diagnosis present

## 2014-03-24 DIAGNOSIS — I2510 Atherosclerotic heart disease of native coronary artery without angina pectoris: Secondary | ICD-10-CM | POA: Diagnosis present

## 2014-03-24 DIAGNOSIS — Z79899 Other long term (current) drug therapy: Secondary | ICD-10-CM | POA: Diagnosis not present

## 2014-03-24 DIAGNOSIS — E118 Type 2 diabetes mellitus with unspecified complications: Secondary | ICD-10-CM | POA: Diagnosis not present

## 2014-03-24 DIAGNOSIS — Z7982 Long term (current) use of aspirin: Secondary | ICD-10-CM | POA: Diagnosis not present

## 2014-03-24 DIAGNOSIS — E785 Hyperlipidemia, unspecified: Secondary | ICD-10-CM | POA: Diagnosis present

## 2014-03-24 DIAGNOSIS — I5020 Unspecified systolic (congestive) heart failure: Secondary | ICD-10-CM | POA: Diagnosis not present

## 2014-03-24 DIAGNOSIS — R55 Syncope and collapse: Secondary | ICD-10-CM | POA: Diagnosis not present

## 2014-03-24 DIAGNOSIS — E669 Obesity, unspecified: Secondary | ICD-10-CM | POA: Diagnosis not present

## 2014-03-24 DIAGNOSIS — E119 Type 2 diabetes mellitus without complications: Secondary | ICD-10-CM | POA: Diagnosis not present

## 2014-03-24 DIAGNOSIS — M6281 Muscle weakness (generalized): Secondary | ICD-10-CM | POA: Diagnosis not present

## 2014-03-24 DIAGNOSIS — N319 Neuromuscular dysfunction of bladder, unspecified: Secondary | ICD-10-CM | POA: Diagnosis present

## 2014-03-24 DIAGNOSIS — I498 Other specified cardiac arrhythmias: Secondary | ICD-10-CM | POA: Diagnosis present

## 2014-03-24 DIAGNOSIS — G4733 Obstructive sleep apnea (adult) (pediatric): Secondary | ICD-10-CM | POA: Diagnosis present

## 2014-03-24 DIAGNOSIS — IMO0001 Reserved for inherently not codable concepts without codable children: Secondary | ICD-10-CM | POA: Diagnosis present

## 2014-03-24 DIAGNOSIS — I5032 Chronic diastolic (congestive) heart failure: Secondary | ICD-10-CM | POA: Diagnosis present

## 2014-03-24 DIAGNOSIS — Z6841 Body Mass Index (BMI) 40.0 and over, adult: Secondary | ICD-10-CM | POA: Diagnosis not present

## 2014-03-24 LAB — BASIC METABOLIC PANEL
Anion gap: 13 (ref 5–15)
BUN: 12 mg/dL (ref 6–23)
CALCIUM: 9.2 mg/dL (ref 8.4–10.5)
CO2: 27 mEq/L (ref 19–32)
Chloride: 94 mEq/L — ABNORMAL LOW (ref 96–112)
Creatinine, Ser: 0.71 mg/dL (ref 0.50–1.35)
GLUCOSE: 264 mg/dL — AB (ref 70–99)
POTASSIUM: 4.3 meq/L (ref 3.7–5.3)
SODIUM: 134 meq/L — AB (ref 137–147)

## 2014-03-24 LAB — GLUCOSE, CAPILLARY: Glucose-Capillary: 274 mg/dL — ABNORMAL HIGH (ref 70–99)

## 2014-03-24 MED ORDER — INSULIN ASPART PROT & ASPART (70-30 MIX) 100 UNIT/ML ~~LOC~~ SUSP: 55.0000 [IU] | Freq: Two times a day (BID) | SUBCUTANEOUS | Status: DC

## 2014-03-24 MED ORDER — MUPIROCIN 2 % EX OINT: 1.0000 "application " | TOPICAL_OINTMENT | Freq: Two times a day (BID) | CUTANEOUS | Status: AC

## 2014-03-24 MED ORDER — POTASSIUM CHLORIDE CRYS ER 20 MEQ PO TBCR: 20.0000 meq | EXTENDED_RELEASE_TABLET | Freq: Every day | ORAL | Status: DC

## 2014-03-24 MED ORDER — FUROSEMIDE 40 MG PO TABS: 40.0000 mg | ORAL_TABLET | Freq: Two times a day (BID) | ORAL | Status: DC

## 2014-03-24 NOTE — Progress Notes (Signed)
Attempted to call report to nursing home; transferred x3 and placed on hold for 8 minutes with no answer.  Will attempt to call back.

## 2014-03-24 NOTE — Discharge Summary (Signed)
Physician Discharge Summary  Patient ID: [name] MRN: [id number] DOB/AGE: 1946-06-28 68 y.o.  Admit date: 03/21/2014 Discharge date: 03/24/2014  Admission Diagnoses: Acute on chronic congestive heart, diastolic Diabetes mellitus, poorly controlled Hypertension Chronic indwelling Foley catheter, neurogenic bladder Presumed obstructive sleep apnea untreated Morbid obesity  Discharge Diagnoses:  Principal Problem:   Acute on chronic diastolic CHF (congestive heart failure), diastolic Active Problems:   DM (diabetes mellitus) poorly controlled   OSA (obstructive sleep apnea), presumed   Hypertension   Chronic indwelling Foley catheter, neurogenic bladder Morbid obesity   Discharged Condition: fair  Hospital Course: The patient was admitted on complaining of shortness of breath over the 24 hours prior to admission. In emergency department chest x-ray showed cardiomegaly and pulmonary edema. Blood sugar was 338. Pro BNP was 1247. The patient was admitted and placed on IV diuretics and diuresed very well. Within 48 hours his shortness of breath was much better. His oxygen level was normal on room air. His insolent was switched from 70/30 2 Humalog or NovoLog mix 75/25 with better control blood sugars running in the 100 to discharge. The patient's indwelling Foley catheter was replaced. The patient was relatively hypotensive and his lisinopril was discontinued. Blood pressure at discharge was running in the one teens to 120s. He was switched from IV to oral Lasix at discharge placed on furosemide 40 mg twice a day along with daily potassium. Kidney function and potassium level will need to be monitored as an outpatient. The patient had had an echocardiogram in March of 2015 showing mild LVH, normal systolic function and this was not repeated. The patient was seen by physical therapy and nursing home placement for strengthening and conditioning was recommended. The patient does live alone  with limited support  Consults: None  Significant Diagnostic Studies: labs: At discharge sodium 134, potassium 4.3, chloride 94, bicarbonate 27, BUN 12 and creatinine 0.71 and radiology: CXR: Mild interstitial prominence but improvement in congestive heart failure pattern on July 14  Treatments: cardiac meds: metoprolol, furosemide anticoagulation: heparin, insulin: Humalog and therapies: PT  Discharge Exam: Blood pressure 126/50, pulse 70, temperature 98.2 F (36.8 C), temperature source Oral, resp. rate 18, height 5\' 11"  (1.803 m), weight 163 kg (359 lb 5.6 oz), SpO2 93.00%. General appearance: alert and cooperative Resp: clear to auscultation bilaterally Cardio: regular rate and rhythm, S1, S2 normal, no murmur, click, rub or gallop  Disposition: 03-skilled nursing home     Medication List    STOP taking these medications       CALMOSEPTINE EX     insulin NPH-regular Human (70-30) 100 UNIT/ML injection  Commonly known as:  NOVOLIN 70/30  Replaced by:  insulin aspart protamine- aspart (70-30) 100 UNIT/ML injection     lisinopril 5 MG tablet  Commonly known as:  PRINIVIL,ZESTRIL      TAKE these medications       ammonium lactate 12 % cream  Commonly known as:  AMLACTIN  Apply topically 2 (two) times daily. For xerosis     aspirin EC 81 MG tablet  Take 81 mg by mouth every morning.     cyclobenzaprine 10 MG tablet  Commonly known as:  FLEXERIL  Take 1 tablet (10 mg total) by mouth 2 (two) times daily as needed for muscle spasms.     diphenhydrAMINE 25 mg capsule  Commonly known as:  BENADRYL  Take 25 mg by mouth every 6 (six) hours as needed. For allergy.     furosemide 40 MG  tablet  Commonly known as:  LASIX  Take 1 tablet (40 mg total) by mouth 2 (two) times daily.     HYDROcodone-acetaminophen 5-325 MG per tablet  Commonly known as:  NORCO/VICODIN  Take 1 tablet by mouth every 4 (four) hours as needed for severe pain.     insulin aspart protamine- aspart  (70-30) 100 UNIT/ML injection  Commonly known as:  NOVOLOG MIX 70/30  Inject 0.55 mLs (55 Units total) into the skin 2 (two) times daily with a meal.     loperamide 2 MG tablet  Commonly known as:  IMODIUM A-D  Take 2 mg by mouth 4 (four) times daily as needed for diarrhea or loose stools.     metoprolol 50 MG tablet  Commonly known as:  LOPRESSOR  Take 100 mg by mouth daily.     mupirocin ointment 2 %  Commonly known as:  BACTROBAN  Place 1 application into the nose 2 (two) times daily. For 3 days and discontinue     nitroGLYCERIN 0.4 MG SL tablet  Commonly known as:  NITROSTAT  Place 0.4 mg under the tongue every 5 (five) minutes as needed for chest pain.     omeprazole 20 MG capsule  Commonly known as:  PRILOSEC  Take 20 mg by mouth daily.     oxybutynin 5 MG tablet  Commonly known as:  DITROPAN  Take 5 mg by mouth 3 (three) times daily as needed for bladder spasms.     potassium chloride SA 20 MEQ tablet  Commonly known as:  K-DUR,KLOR-CON  Take 1 tablet (20 mEq total) by mouth daily.           Follow-up Information   Please follow up. (1 week after discharge from nursing home)       Follow up with Irven Shelling, MD In 5 weeks. (One week after discharge from nursing home)    Specialty:  Internal Medicine   Contact information:   [address] [place] [address] [phone]       Signed: Irven Shelling 03/24/2014, 7:57 AM

## 2014-03-24 NOTE — Progress Notes (Signed)
Patient is set to discharge to [facility] today. Patient is aware, patient states he will make his friend aware. Discharge packet in [place] aware. [facility] scheduled for 10am pickup (Service Request Id: [id number]).   Clinical Social Work Department CLINICAL SOCIAL WORK PLACEMENT NOTE 03/24/2014  Patient:  [name], [name]  Account Number:  [id number] Admit date:  03/21/2014  Clinical Social Worker:  Ulyess Blossom  Date/time:  03/22/2014 04:43 PM  Clinical Social Work is seeking post-discharge placement for this patient at the following level of care:   SKILLED NURSING   (*[name] will update this form in Epic as items are completed)   03/22/2014  Patient/family provided with [place] Department of Clinical Social Work's list of facilities offering this level of care within the geographic area requested by the patient (or if unable, by the patient's family).  03/22/2014  Patient/family informed of their freedom to choose among providers that offer the needed level of care, that participate in Medicare, Medicaid or managed care program needed by the patient, have an available bed and are willing to accept the patient.  03/22/2014  Patient/family informed of [facility]' ownership interest in [facility], as well as of the fact that they are under no obligation to receive care at this facility.  PASARR submitted to EDS on 03/22/2014 PASARR number received on 03/22/2014  FL2 transmitted to all facilities in geographic area requested by pt/family on  03/22/2014 FL2 transmitted to all facilities within larger geographic area on   Patient informed that his/her managed care company has contracts with or will negotiate with  certain facilities, including the following:     Patient/family informed of bed offers received:  03/24/2014 Patient chooses bed at [facility] Physician recommends and patient chooses bed at    Patient to be transferred  to [facility] on  03/24/2014 Patient to be transferred to facility by [facility] Patient and family notified of transfer on 03/24/2014 Name of family member notified:  patient to inform friend listed on facesheet  The following physician request were entered in Epic:   Additional Comments:   [name], [place] Social Worker cell #: [phone]

## 2014-04-01 ENCOUNTER — Emergency Department (HOSPITAL_COMMUNITY): Payer: Medicare Other

## 2014-04-01 ENCOUNTER — Inpatient Hospital Stay (HOSPITAL_COMMUNITY)
Admission: EM | Admit: 2014-04-01 | Discharge: 2014-04-03 | DRG: 312 | Disposition: A | Discharge status: Skilled Nursing Facility | Payer: Medicare Other | Attending: Internal Medicine | Admitting: Internal Medicine

## 2014-04-01 ENCOUNTER — Encounter (HOSPITAL_COMMUNITY): Payer: Self-pay | Admitting: Emergency Medicine

## 2014-04-01 DIAGNOSIS — Z96659 Presence of unspecified artificial knee joint: Secondary | ICD-10-CM

## 2014-04-01 DIAGNOSIS — I10 Essential (primary) hypertension: Secondary | ICD-10-CM | POA: Diagnosis not present

## 2014-04-01 DIAGNOSIS — R079 Chest pain, unspecified: Secondary | ICD-10-CM | POA: Diagnosis not present

## 2014-04-01 DIAGNOSIS — E119 Type 2 diabetes mellitus without complications: Secondary | ICD-10-CM | POA: Diagnosis not present

## 2014-04-01 DIAGNOSIS — M6281 Muscle weakness (generalized): Secondary | ICD-10-CM | POA: Diagnosis not present

## 2014-04-01 DIAGNOSIS — Z7982 Long term (current) use of aspirin: Secondary | ICD-10-CM

## 2014-04-01 DIAGNOSIS — IMO0001 Reserved for inherently not codable concepts without codable children: Secondary | ICD-10-CM | POA: Diagnosis present

## 2014-04-01 DIAGNOSIS — I5032 Chronic diastolic (congestive) heart failure: Secondary | ICD-10-CM

## 2014-04-01 DIAGNOSIS — I498 Other specified cardiac arrhythmias: Secondary | ICD-10-CM | POA: Diagnosis present

## 2014-04-01 DIAGNOSIS — Z794 Long term (current) use of insulin: Secondary | ICD-10-CM | POA: Diagnosis not present

## 2014-04-01 DIAGNOSIS — Z79899 Other long term (current) drug therapy: Secondary | ICD-10-CM | POA: Diagnosis not present

## 2014-04-01 DIAGNOSIS — Z87891 Personal history of nicotine dependence: Secondary | ICD-10-CM | POA: Diagnosis not present

## 2014-04-01 DIAGNOSIS — I509 Heart failure, unspecified: Secondary | ICD-10-CM | POA: Diagnosis present

## 2014-04-01 DIAGNOSIS — E1149 Type 2 diabetes mellitus with other diabetic neurological complication: Secondary | ICD-10-CM | POA: Diagnosis present

## 2014-04-01 DIAGNOSIS — E1140 Type 2 diabetes mellitus with diabetic neuropathy, unspecified: Secondary | ICD-10-CM | POA: Diagnosis present

## 2014-04-01 DIAGNOSIS — E1165 Type 2 diabetes mellitus with hyperglycemia: Secondary | ICD-10-CM

## 2014-04-01 DIAGNOSIS — I2510 Atherosclerotic heart disease of native coronary artery without angina pectoris: Secondary | ICD-10-CM

## 2014-04-01 DIAGNOSIS — Z6841 Body Mass Index (BMI) 40.0 and over, adult: Secondary | ICD-10-CM

## 2014-04-01 DIAGNOSIS — E785 Hyperlipidemia, unspecified: Secondary | ICD-10-CM | POA: Diagnosis present

## 2014-04-01 DIAGNOSIS — R55 Syncope and collapse: Principal | ICD-10-CM

## 2014-04-01 DIAGNOSIS — N319 Neuromuscular dysfunction of bladder, unspecified: Secondary | ICD-10-CM | POA: Diagnosis present

## 2014-04-01 DIAGNOSIS — R0789 Other chest pain: Secondary | ICD-10-CM

## 2014-04-01 DIAGNOSIS — G4733 Obstructive sleep apnea (adult) (pediatric): Secondary | ICD-10-CM | POA: Diagnosis present

## 2014-04-01 DIAGNOSIS — Z8673 Personal history of transient ischemic attack (TIA), and cerebral infarction without residual deficits: Secondary | ICD-10-CM

## 2014-04-01 DIAGNOSIS — E118 Type 2 diabetes mellitus with unspecified complications: Secondary | ICD-10-CM

## 2014-04-01 DIAGNOSIS — R262 Difficulty in walking, not elsewhere classified: Secondary | ICD-10-CM | POA: Diagnosis not present

## 2014-04-01 DIAGNOSIS — E669 Obesity, unspecified: Secondary | ICD-10-CM | POA: Diagnosis not present

## 2014-04-01 DIAGNOSIS — Z833 Family history of diabetes mellitus: Secondary | ICD-10-CM | POA: Diagnosis not present

## 2014-04-01 DIAGNOSIS — C384 Malignant neoplasm of pleura: Secondary | ICD-10-CM | POA: Diagnosis not present

## 2014-04-01 DIAGNOSIS — I2584 Coronary atherosclerosis due to calcified coronary lesion: Secondary | ICD-10-CM

## 2014-04-01 DIAGNOSIS — R42 Dizziness and giddiness: Secondary | ICD-10-CM | POA: Diagnosis present

## 2014-04-01 DIAGNOSIS — Z978 Presence of other specified devices: Secondary | ICD-10-CM

## 2014-04-01 DIAGNOSIS — E0800 Diabetes mellitus due to underlying condition with hyperosmolarity without nonketotic hyperglycemic-hyperosmolar coma (NKHHC): Secondary | ICD-10-CM | POA: Diagnosis present

## 2014-04-01 DIAGNOSIS — E1142 Type 2 diabetes mellitus with diabetic polyneuropathy: Secondary | ICD-10-CM | POA: Diagnosis not present

## 2014-04-01 DIAGNOSIS — Z960 Presence of urogenital implants: Secondary | ICD-10-CM

## 2014-04-01 LAB — BASIC METABOLIC PANEL
Anion gap: 10 (ref 5–15)
BUN: 9 mg/dL (ref 6–23)
CALCIUM: 8.9 mg/dL (ref 8.4–10.5)
CO2: 29 mEq/L (ref 19–32)
Chloride: 96 mEq/L (ref 96–112)
Creatinine, Ser: 0.69 mg/dL (ref 0.50–1.35)
GFR calc non Af Amer: >90 mL/min (ref >90)
GLUCOSE: 173 mg/dL — AB (ref 70–99)
POTASSIUM: 4 meq/L (ref 3.7–5.3)
SODIUM: 135 meq/L — AB (ref 137–147)

## 2014-04-01 LAB — CBC
HCT: 45.9 % (ref 39.0–52.0)
Hemoglobin: 15.4 g/dL (ref 13.0–17.0)
MCH: 30.9 pg (ref 26.0–34.0)
MCHC: 33.6 g/dL (ref 30.0–36.0)
MCV: 92 fL (ref 78.0–100.0)
Platelets: 281 10*3/uL (ref 150–400)
RBC: 4.99 MIL/uL (ref 4.22–5.81)
RDW: 13.4 % (ref 11.5–15.5)
WBC: 7.8 10*3/uL (ref 4.0–10.5)

## 2014-04-01 LAB — PRO B NATRIURETIC PEPTIDE: Pro B Natriuretic peptide (BNP): 658.6 pg/mL — ABNORMAL HIGH (ref 0–125)

## 2014-04-01 LAB — TSH: TSH: 2.01 u[IU]/mL (ref 0.350–4.500)

## 2014-04-01 LAB — TROPONIN I

## 2014-04-01 LAB — GLUCOSE, CAPILLARY: GLUCOSE-CAPILLARY: 215 mg/dL — AB (ref 70–99)

## 2014-04-01 LAB — MRSA PCR SCREENING: MRSA by PCR: NEGATIVE

## 2014-04-01 LAB — I-STAT TROPONIN, ED: Troponin i, poc: 0 ng/mL (ref 0.00–0.08)

## 2014-04-01 MED ORDER — HYDROCODONE-ACETAMINOPHEN 5-325 MG PO TABS
1.0000 | ORAL_TABLET | ORAL | Status: DC | PRN
  Administered 2014-04-03: 1 via ORAL
  Filled 2014-04-01: qty 1

## 2014-04-01 MED ORDER — ASPIRIN EC 81 MG PO TBEC
81.0000 mg | DELAYED_RELEASE_TABLET | Freq: Every morning | ORAL | Status: DC
  Administered 2014-04-02 – 2014-04-03 (×2): 81 mg via ORAL
  Filled 2014-04-01 (×2): qty 1

## 2014-04-01 MED ORDER — ACETAMINOPHEN 325 MG PO TABS: 650.0000 mg | ORAL_TABLET | Freq: Four times a day (QID) | ORAL | Status: DC | PRN

## 2014-04-01 MED ORDER — PANTOPRAZOLE SODIUM 40 MG PO TBEC
40.0000 mg | DELAYED_RELEASE_TABLET | Freq: Every day | ORAL | Status: DC
  Administered 2014-04-02 – 2014-04-03 (×2): 40 mg via ORAL
  Filled 2014-04-01 (×2): qty 1

## 2014-04-01 MED ORDER — ONDANSETRON HCL 4 MG/2ML IJ SOLN: 4.0000 mg | Freq: Four times a day (QID) | INTRAMUSCULAR | Status: DC | PRN

## 2014-04-01 MED ORDER — INSULIN ASPART PROT & ASPART (70-30 MIX) 100 UNIT/ML ~~LOC~~ SUSP
30.0000 [IU] | Freq: Two times a day (BID) | SUBCUTANEOUS | Status: DC
  Administered 2014-04-02 – 2014-04-03 (×3): 30 [IU] via SUBCUTANEOUS
  Filled 2014-04-01: qty 10

## 2014-04-01 MED ORDER — SODIUM CHLORIDE 0.9 % IJ SOLN
3.0000 mL | Freq: Two times a day (BID) | INTRAMUSCULAR | Status: DC
  Administered 2014-04-01 – 2014-04-03 (×4): 3 mL via INTRAVENOUS

## 2014-04-01 MED ORDER — ACETAMINOPHEN 650 MG RE SUPP: 650.0000 mg | Freq: Four times a day (QID) | RECTAL | Status: DC | PRN

## 2014-04-01 MED ORDER — ENOXAPARIN SODIUM 80 MG/0.8ML ~~LOC~~ SOLN
80.0000 mg | SUBCUTANEOUS | Status: DC
  Administered 2014-04-01 – 2014-04-02 (×2): 80 mg via SUBCUTANEOUS
  Filled 2014-04-01 (×3): qty 0.8

## 2014-04-01 MED ORDER — ONDANSETRON HCL 4 MG PO TABS: 4.0000 mg | ORAL_TABLET | Freq: Four times a day (QID) | ORAL | Status: DC | PRN

## 2014-04-01 MED ORDER — CYCLOBENZAPRINE HCL 10 MG PO TABS
10.0000 mg | ORAL_TABLET | Freq: Two times a day (BID) | ORAL | Status: DC | PRN
  Filled 2014-04-01: qty 1

## 2014-04-01 MED ORDER — INSULIN ASPART 100 UNIT/ML ~~LOC~~ SOLN
0.0000 [IU] | Freq: Every day | SUBCUTANEOUS | Status: DC
  Administered 2014-04-01: 2 [IU] via SUBCUTANEOUS
  Administered 2014-04-02: 5 [IU] via SUBCUTANEOUS

## 2014-04-01 MED ORDER — ASPIRIN 81 MG PO CHEW: 324.0000 mg | CHEWABLE_TABLET | Freq: Once | ORAL | Status: DC

## 2014-04-01 MED ORDER — INSULIN ASPART 100 UNIT/ML ~~LOC~~ SOLN
0.0000 [IU] | Freq: Three times a day (TID) | SUBCUTANEOUS | Status: DC
  Administered 2014-04-02 (×2): 5 [IU] via SUBCUTANEOUS
  Administered 2014-04-02 – 2014-04-03 (×3): 3 [IU] via SUBCUTANEOUS

## 2014-04-01 NOTE — ED Provider Notes (Signed)
CSN: [id number]     Arrival date & time 04/01/14  1419 History   First MD Initiated Contact with Patient 04/01/14 1458     Chief Complaint  Patient presents with  . Chest Pain     (Consider location/radiation/quality/duration/timing/severity/associated sxs/prior Treatment) HPI 68 year old male presents with chest pressure that started when he stood up. He was at physical therapy at his rehabilitation facility and felt lightheaded and chest pressure when standing. He's been feeling lightheaded this intermittently with standing for the past couple weeks but never had chest pain or pressure. He was given aspirin and 2 nitroglycerin and had complete relief. He probably passed out after the chest pressure started. No shortness of breath, cough, or fevers. He states he has leg swelling but does not worsen when he is discharged a few weeks ago. He currently feels well while laying in the bed.  Past Medical History  Diagnosis Date  . Obese   . Hypertension   . Diabetes mellitus   . Diabetic diarrhea   . Sleep apnea   . Diabetic neuropathy, painful   . Chronic indwelling Foley catheter   . Bladder spasms   . Complication of anesthesia   . PONV (postoperative nausea and vomiting)   . Hyperlipidemia   . GERD (gastroesophageal reflux disease)   . Neurogenic bladder   . Stroke 2013   Past Surgical History  Procedure Laterality Date  . Right tkr    . Gastroplasty    . Cholecystectomy    . Total knee arthroplasty     Family History  Problem Relation Age of Onset  . Hypertension Mother   . Diabetes type I Father   . Cancer Brother      Testicular Cancer  . Cancer Brother     Leukemia   History  Substance Use Topics  . Smoking status: Former Smoker -- 2.00 packs/day for 20 years    Quit date: 09/10/1976  . Smokeless tobacco: Never Used  . Alcohol Use: No     Comment: FORMER ALCOHOLIC WITH 23 YEAR SOBRIETY    Review of Systems  Constitutional: Negative for fever.  HENT:  Negative for congestion.   Respiratory: Negative for cough and shortness of breath.   Cardiovascular: Positive for chest pain and leg swelling.  Gastrointestinal: Negative for vomiting and abdominal pain.  Neurological: Positive for syncope and light-headedness. Negative for headaches.  All other systems reviewed and are negative.     Allergies  Ace inhibitors; Lipitor; Metformin and related; Cefadroxil; Cephalexin; Methocarbamol; Morphine and related; and Robaxin  Home Medications   Prior to Admission medications   Medication Sig Start Date End Date Taking? Authorizing Provider  ammonium lactate (AMLACTIN) 12 % cream Apply topically 2 (two) times daily. For xerosis   Yes Historical Provider, MD  aspirin EC 81 MG tablet Take 81 mg by mouth every morning.    Yes Historical Provider, MD  cyclobenzaprine (FLEXERIL) 10 MG tablet Take 1 tablet (10 mg total) by mouth 2 (two) times daily as needed for muscle spasms. 10/26/13  Yes Hoy Morn, MD  diphenhydrAMINE (BENADRYL) 25 mg capsule Take 25 mg by mouth every 6 (six) hours as needed. For allergy.   Yes Historical Provider, MD  furosemide (LASIX) 40 MG tablet Take 1 tablet (40 mg total) by mouth 2 (two) times daily. 03/24/14  Yes Irven Shelling, MD  HYDROcodone-acetaminophen (NORCO/VICODIN) 5-325 MG per tablet Take 1 tablet by mouth every 4 (four) hours as needed for severe pain. 12/25/13  Yes Erlene Quan, PA-C  insulin aspart protamine- aspart (NOVOLOG MIX 70/30) (70-30) 100 UNIT/ML injection Inject 0.55 mLs (55 Units total) into the skin 2 (two) times daily with a meal. 03/24/14  Yes Irven Shelling, MD  loperamide (IMODIUM A-D) 2 MG tablet Take 2 mg by mouth 4 (four) times daily as needed for diarrhea or loose stools.   Yes Historical Provider, MD  metoprolol (LOPRESSOR) 50 MG tablet Take 100 mg by mouth daily.   Yes Historical Provider, MD  mupirocin ointment (BACTROBAN) 2 % Place 1 application into the nose 2 (two) times daily.    Yes Historical Provider, MD  nitroGLYCERIN (NITROSTAT) 0.4 MG SL tablet Place 0.4 mg under the tongue every 5 (five) minutes as needed for chest pain.   Yes Historical Provider, MD  omeprazole (PRILOSEC) 20 MG capsule Take 20 mg by mouth daily.   Yes Historical Provider, MD  oxybutynin (DITROPAN) 5 MG tablet Take 5 mg by mouth 3 (three) times daily as needed for bladder spasms.   Yes Historical Provider, MD  potassium chloride SA (K-DUR,KLOR-CON) 20 MEQ tablet Take 1 tablet (20 mEq total) by mouth daily. 03/24/14  Yes Irven Shelling, MD   BP 106/56  Temp(Src) 98.3 F (36.8 C) (Oral)  Resp 17  Ht 6' (1.829 m)  Wt 359 lb (162.841 kg)  BMI 48.68 kg/m2  SpO2 99% Physical Exam  Nursing note and vitals reviewed. Constitutional: He is oriented to person, place, and time. He appears well-developed and well-nourished.  obese  HENT:  Head: Normocephalic and atraumatic.  Right Ear: External ear normal.  Left Ear: External ear normal.  Nose: Nose normal.  Eyes: Right eye exhibits no discharge. Left eye exhibits no discharge.  Neck: Neck supple.  Cardiovascular: Normal rate, regular rhythm, normal heart sounds and intact distal pulses.   Pulmonary/Chest: Effort normal. He has no wheezes.  Mild bibasilar crackles  Abdominal: Soft. He exhibits no distension. There is no tenderness.  Musculoskeletal: He exhibits edema (BLE).  Neurological: He is alert and oriented to person, place, and time.  Skin: Skin is warm and dry.    ED Course  Procedures (including critical care time) Labs Review Labs Reviewed  PRO B NATRIURETIC PEPTIDE - Abnormal; Notable for the following:    Pro B Natriuretic peptide (BNP) 658.6 (*)    All other components within normal limits  BASIC METABOLIC PANEL - Abnormal; Notable for the following:    Sodium 135 (*)    Glucose, Bld 173 (*)    All other components within normal limits  CBC  I-STAT TROPOININ, ED    Imaging Review Dg Chest Port 1 View  04/01/2014    CLINICAL DATA:  Chest pain  EXAM: PORTABLE CHEST - 1 VIEW  COMPARISON:  03/23/2014  FINDINGS: Left lower lobe atelectasis/ infiltrate has developed since the prior study. Right lung is clear. Negative for edema or effusion.  IMPRESSION: Left lower lobe atelectasis/pneumonia.   Electronically Signed   By: Franchot Gallo M.D.   On: 04/01/2014 14:45     EKG Interpretation   Date/Time:  Thursday April 01 2014 14:23:40 EDT Ventricular Rate:  75 PR Interval:  251 QRS Duration: 123 QT Interval:  414 QTC Calculation: 462 R Axis:   -57 Text Interpretation:  Sinus rhythm Prolonged PR interval Left bundle  branch block Baseline wander in lead(s) V2 No significant change since  last tracing Confirmed by DOCHERTY  MD, [place] ([id number]) on 04/01/2014 2:29:07  PM  MDM   Final diagnoses:  Syncope, unspecified syncope type  Chest pressure    Given patient's chest pressure with standing and subsequent syncope is concerning for possible cardiac source. He was just recently admitted and discharged for CHF, but likely needs reevaluation given his high risk with syncope. Patient denies cough, dyspnea or fever, and thus the Xray finding is more likely atelectasis than PNA.    Ephraim Hamburger, MD 04/01/14 [phone]

## 2014-04-01 NOTE — ED Notes (Signed)
Pt was hypotensive in lying position while acquiring orthostatics, could not stand for standing portion. Regenia Skeeter, MD notified.

## 2014-04-01 NOTE — H&P (Signed)
History and Physical  [name] [id number] DOB: [birth year] DOA: 04/01/2014   PCP: Irven Shelling, MD   Chief Complaint: Dizziness/syncope  HPI:  68 year old male with a history of morbid obesity, hypertension, diabetes mellitus type 2, chronic indwelling Foley catheter secondary to neurogenic bladder, obstructive sleep apnea, hyperlipidemia, stroke in 2013, and coronary artery disease presents with a syncopal episode with physical therapy at [name] care today. The patient was recently discharged from the hospital on 03/24/2014 after an episode of CHF decompensation. The patient was discharged with furosemide 40 mg po twice a day. Since that period of time, the patient has been complaining of intermittent dizziness with positional changes with physical therapy. In addition, he has been complaining of intermittent chest discomfort and shortness of breath when he has physical therapy. When he is at rest, the patient does not have any chest discomfort or shortness of breath. He denies any PND orthopnea. When participating with physical therapy today, the patient was given up from a seated position and started feeling dizzy and had chest discomfort prior to his syncopal episode. There was no tongue bite, bowel or bladder incontinence. He denies any fevers, chills, headache, visual disturbance, focal extremity weakness. Cardiac cath in 2013 revealed sigificant distal right coronary artery disease, diffuse, long segment up to 90% at the bifurcation of a small PDA branch. 50% stenosis of the proximal large first diagonal branch and elevated left ventricular end-diastolic pressure consistent with diastolic dysfunction. 26 mmHg. Given the diffuse, long nature of his distal right coronary region, percutaneous intervention to this region portends a high risk to benefit ratio. It was elected to proceed with medical therapy  In the emergency department, the patient's blood pressures were  soft with systolic blood pressures in the upper 90s and low 100s. The patient wasn't able to participate with orthostatics because of dizziness. He denies any chest discomfort. The patient was afebrile. BMP and CBC were essentially unremarkable. Point of care troponin was negative. BNP was 658.  Assessment/Plan: Syncope -Appears to be orthostatic/vasovagal response from the patient's clinical history -Certainly, the patient is on a number of medications with anticholinergic side effects including his Benadryl and Ditropan. -Furthermore, the patient's metoprolol and furosemide may also cause the patient to be orthostatic.  -As such, I will hold any further dose of furosemide until the patient is evaluated in morning of 04/02/2014 -I will hold the patient's dose of metoprolol tartrate until the patient is reevaluated on the morning of 04/02/2014. -As the patient had a recent echocardiogram on 12/23/2013, I will not repeat an echocardiogram at this time. EF at that time was 55-60% -Recheck orthostatic vital signs -UA/urine culture -TSH -PT evaluation Atypical chest pain -Cycle troponins -EKG is unchanged from previous Lower extremity pain and edema  -Venous duplex r/o DVT   diabetes mellitus type 2, uncontrolled  -03/21/2049 hemoglobin A1c 9.3  -start 70/30 at 1/2 home dose  -novolog sliding scale and adjust accordingly  chronic diastolic CHF  -The patient appears to be compensated at this time without any dyspnea and oxygen saturation 100% on room air  LLL infiltrate  -I do not believe that the patient clinically has pneumonia at this time  -He has no fever, no leukocytosis, and no worsening shortness of breath  -I will not start the patient on any antibiotics at this time  Chronic indwelling Foley catheter  -Patient states that it is due to be changed on 04/02/2014  -UA and urine culture  Coronary  artery disease  -Continue aspirin  -Cycle troponins but doubt ACS      Past  Medical History  Diagnosis Date  . Obese   . Hypertension   . Diabetes mellitus   . Diabetic diarrhea   . Sleep apnea   . Diabetic neuropathy, painful   . Chronic indwelling Foley catheter   . Bladder spasms   . Complication of anesthesia   . PONV (postoperative nausea and vomiting)   . Hyperlipidemia   . GERD (gastroesophageal reflux disease)   . Neurogenic bladder   . Stroke 2013   Past Surgical History  Procedure Laterality Date  . Right tkr    . Gastroplasty    . Cholecystectomy    . Total knee arthroplasty     Social History:  reports that he quit smoking about 37 years ago. He has never used smokeless tobacco. He reports that he does not drink alcohol or use illicit drugs.   Family History  Problem Relation Age of Onset  . Hypertension Mother   . Diabetes type I Father   . Cancer Brother      Testicular Cancer  . Cancer Brother     Leukemia     Allergies  Allergen Reactions  . Ace Inhibitors Swelling  . Lipitor [Atorvastatin Calcium] Swelling  . Metformin And Related Swelling  . Cefadroxil Hives  . Cephalexin Hives  . Methocarbamol Other (See Comments)    Feels like is in "rocky boat"  . Morphine And Related Other (See Comments)    Sweating, feels like is "in rocky boat."  . Robaxin [Methocarbamol] Other (See Comments)    Feels like he is shaky      Prior to Admission medications   Medication Sig Start Date End Date Taking? Authorizing Provider  ammonium lactate (AMLACTIN) 12 % cream Apply topically 2 (two) times daily. For xerosis   Yes Historical Provider, MD  aspirin EC 81 MG tablet Take 81 mg by mouth every morning.    Yes Historical Provider, MD  cyclobenzaprine (FLEXERIL) 10 MG tablet Take 1 tablet (10 mg total) by mouth 2 (two) times daily as needed for muscle spasms. 10/26/13  Yes Hoy Morn, MD  diphenhydrAMINE (BENADRYL) 25 mg capsule Take 25 mg by mouth every 6 (six) hours as needed. For allergy.   Yes Historical Provider, MD    furosemide (LASIX) 40 MG tablet Take 1 tablet (40 mg total) by mouth 2 (two) times daily. 03/24/14  Yes Irven Shelling, MD  HYDROcodone-acetaminophen (NORCO/VICODIN) 5-325 MG per tablet Take 1 tablet by mouth every 4 (four) hours as needed for severe pain. 12/25/13  Yes Luke K Kilroy, PA-C  insulin aspart protamine- aspart (NOVOLOG MIX 70/30) (70-30) 100 UNIT/ML injection Inject 0.55 mLs (55 Units total) into the skin 2 (two) times daily with a meal. 03/24/14  Yes Irven Shelling, MD  loperamide (IMODIUM A-D) 2 MG tablet Take 2 mg by mouth 4 (four) times daily as needed for diarrhea or loose stools.   Yes Historical Provider, MD  metoprolol (LOPRESSOR) 50 MG tablet Take 100 mg by mouth daily.   Yes Historical Provider, MD  mupirocin ointment (BACTROBAN) 2 % Place 1 application into the nose 2 (two) times daily.   Yes Historical Provider, MD  nitroGLYCERIN (NITROSTAT) 0.4 MG SL tablet Place 0.4 mg under the tongue every 5 (five) minutes as needed for chest pain.   Yes Historical Provider, MD  omeprazole (PRILOSEC) 20 MG capsule Take 20 mg by mouth daily.  Yes Historical Provider, MD  oxybutynin (DITROPAN) 5 MG tablet Take 5 mg by mouth 3 (three) times daily as needed for bladder spasms.   Yes Historical Provider, MD  potassium chloride SA (K-DUR,KLOR-CON) 20 MEQ tablet Take 1 tablet (20 mEq total) by mouth daily. 03/24/14  Yes Irven Shelling, MD    Review of Systems:  Constitutional:  No weight loss, night sweats, Fevers, chills, fatigue.  Head&Eyes: No headache.  No vision loss.  No eye pain or scotoma ENT:  No Difficulty swallowing,Tooth/dental problems,Sore throat,  No ear ache, post nasal drip,  Cardio-vascular:  No chest pain, Orthopnea, PND, swelling in lower extremities,   palpitations  GI:  No  abdominal pain, nausea, vomiting, diarrhea, loss of appetite, hematochezia, melena, heartburn, indigestion, Resp:   No cough. No coughing up of blood .No wheezing.No chest wall  deformity  Skin:  no rash or lesions.  GU:  no dysuria, change in color of urine, no urgency or frequency. No flank pain.  Musculoskeletal:  No joint pain or swelling. No decreased range of motion. No back pain.  Psych:  No change in mood or affect Neurologic: No headache, no dysesthesia, no focal weakness, no vision loss.   Physical Exam: Filed Vitals:   04/01/14 1600 04/01/14 1609 04/01/14 1612 04/01/14 1630  BP: 88/61 94/38 105/42 116/48  Pulse: 70 62 62 63  Temp:      TempSrc:      Resp: 21 17 29 14   Height:      Weight:      SpO2: 100% 100% 98% 99%   General:  A&O x 3, NAD, nontoxic, pleasant/cooperative Head/Eye: No conjunctival hemorrhage, no icterus, [place]/AT, No nystagmus ENT:  No icterus,  No thrush, edentulous, no pharyngeal exudate Neck:  No masses, no lymphadenpathy, no bruits CV:  RRR, no rub, no gallop, no S3 Lung:  Left basilar crackles. Right auscultation. No wheezing Abdomen: soft/NT, +BS, nondistended, no peritoneal signs Ext: No cyanosis, No rashes, No petechiae, No lymphangitis, 2+LE edema; scattered crusting scabs on the patient's bilateral toes and feet without any erythema, drainage, lymphangitis  Neuro: CNII-XII intact, strength 4/5 in bilateral upper and lower extremities, no dysmetria  Labs on Admission:  Basic Metabolic Panel:  Recent Labs Lab 04/01/14 1431  NA 135*  K 4.0  CL 96  CO2 29  GLUCOSE 173*  BUN 9  CREATININE 0.69  CALCIUM 8.9   Liver Function Tests: No results found for this basename: AST, ALT, ALKPHOS, BILITOT, PROT, ALBUMIN,  in the last 168 hours No results found for this basename: LIPASE, AMYLASE,  in the last 168 hours No results found for this basename: AMMONIA,  in the last 168 hours CBC:  Recent Labs Lab 04/01/14 1431  WBC 7.8  HGB 15.4  HCT 45.9  MCV 92.0  PLT 281   Cardiac Enzymes: No results found for this basename: CKTOTAL, CKMB, CKMBINDEX, TROPONINI,  in the last 168 hours BNP: No components found  with this basename: POCBNP,  CBG: No results found for this basename: GLUCAP,  in the last 168 hours  Radiological Exams on Admission: Dg Chest Port 1 View  04/01/2014   CLINICAL DATA:  Chest pain  EXAM: PORTABLE CHEST - 1 VIEW  COMPARISON:  03/23/2014  FINDINGS: Left lower lobe atelectasis/ infiltrate has developed since the prior study. Right lung is clear. Negative for edema or effusion.  IMPRESSION: Left lower lobe atelectasis/pneumonia.   Electronically Signed   By: Franchot Gallo M.D.   On:  04/01/2014 14:45    EKG: Independently reviewed. Sinus rhythm, first degree AV block, incomplete left bundle branch block    Time spent:60 minutes Code Status:   FULL Family Communication:   No Family at bedside   Jermesha Sottile, DO  [facility] Pager [phone]  If 7PM-7AM, please contact night-coverage [email or web address] Password [facility] 04/01/2014, 5:16 PM

## 2014-04-01 NOTE — ED Notes (Signed)
Pt arrived with Foley Catheter in place.

## 2014-04-01 NOTE — ED Notes (Signed)
Patient is resting comfortably. 

## 2014-04-01 NOTE — ED Notes (Signed)
Patient unable to stand for "Standing" portion of Orthostatic Vital Signs.

## 2014-04-01 NOTE — ED Notes (Signed)
Pt is  Resident at [facility], Was sent to PT and when he stood up he became dizzy and had chest pressure, after asa, and ntg x 2 pt does have relief now. Pt reported some nausea now subsided.

## 2014-04-01 NOTE — ED Notes (Signed)
Patient denies pain and is resting comfortably.  

## 2014-04-02 LAB — BASIC METABOLIC PANEL
ANION GAP: 14 (ref 5–15)
BUN: 11 mg/dL (ref 6–23)
CO2: 28 mEq/L (ref 19–32)
Calcium: 9 mg/dL (ref 8.4–10.5)
Chloride: 97 mEq/L (ref 96–112)
Creatinine, Ser: 0.7 mg/dL (ref 0.50–1.35)
GFR calc Af Amer: >90 mL/min (ref >90)
GLUCOSE: 179 mg/dL — AB (ref 70–99)
POTASSIUM: 4.5 meq/L (ref 3.7–5.3)
SODIUM: 139 meq/L (ref 137–147)

## 2014-04-02 LAB — TROPONIN I: Troponin I: <0.3 ng/mL (ref <0.30)

## 2014-04-02 LAB — GLUCOSE, CAPILLARY
GLUCOSE-CAPILLARY: 228 mg/dL — AB (ref 70–99)
Glucose-Capillary: 219 mg/dL — ABNORMAL HIGH (ref 70–99)
Glucose-Capillary: 186 mg/dL — ABNORMAL HIGH (ref 70–99)
Glucose-Capillary: 194 mg/dL — ABNORMAL HIGH (ref 70–99)

## 2014-04-02 NOTE — Care Management Note (Unsigned)
    Page 1 of 1   04/02/2014     11:12:02 AM CARE MANAGEMENT NOTE 04/02/2014  Patient:  [name], [name]   Account Number:  [id number]  Date Initiated:  04/02/2014  Documentation initiated by:  GRAVES-BIGELOW,Taitum Menton  Subjective/Objective Assessment:   Pt admitted for dizziness and syncope. Pt is from [facility].     Action/Plan:   Plan to return to SNF once medically stable. [name] to assist with disposition needs.   Anticipated DC Date:  04/04/2014   Anticipated DC Plan:  SKILLED NURSING FACILITY  In-house referral  Clinical Social Worker      DC Planning Services  CM consult      Choice offered to / List presented to:             Status of service:  In process, will continue to follow Medicare Important Message given?   (If response is "NO", the following Medicare IM given date fields will be blank) Date Medicare IM given:   Medicare IM given by:   Date Additional Medicare IM given:   Additional Medicare IM given by:    Discharge Disposition:    Per UR Regulation:  Reviewed for med. necessity/level of care/duration of stay  If discussed at [place] of Stay Meetings, dates discussed:    Comments:

## 2014-04-02 NOTE — Progress Notes (Signed)
UR Completed Nysa Sarin Graves-Bigelow, RN,BSN [phone]  

## 2014-04-02 NOTE — Progress Notes (Signed)
Subjective: No complaints  Objective: Vital signs in last 24 hours: Temp:  [97.5 F (36.4 C)-98.3 F (36.8 C)] 98 F (36.7 C) (07/24 0532) Pulse Rate:  [62-74] 72 (07/24 0532) Resp:  [11-29] 20 (07/24 0532) BP: (88-135)/(38-61) 135/46 mmHg (07/24 0532) SpO2:  [95 %-100 %] 98 % (07/24 0532) Weight:  [159.213 kg (351 lb)-162.841 kg (359 lb)] 159.355 kg (351 lb 5 oz) (07/24 0532) Weight change:  Last BM Date: 04/01/14  Intake/Output from previous day: 07/23 0701 - 07/24 0700 In: 3 [I.V.:3] Out: 1000 [Urine:1000] Intake/Output this shift:    General appearance: alert and cooperative Resp: clear to auscultation bilaterally Cardio: regular rate and rhythm, S1, S2 normal, no murmur, click, rub or gallop GI: soft, non-tender; bowel sounds normal; no masses,  no organomegaly Extremities: extremities normal, atraumatic, no cyanosis or edema  Lab Results:  Recent Labs  04/01/14 1431  WBC 7.8  HGB 15.4  HCT 45.9  PLT 281   BMET  Recent Labs  04/01/14 1431 04/02/14 0030  NA 135* 139  K 4.0 4.5  CL 96 97  CO2 29 28  GLUCOSE 173* 179*  BUN 9 11  CREATININE 0.69 0.70  CALCIUM 8.9 9.0    Studies/Results: Dg Chest Port 1 View  04/01/2014   CLINICAL DATA:  Chest pain  EXAM: PORTABLE CHEST - 1 VIEW  COMPARISON:  03/23/2014  FINDINGS: Left lower lobe atelectasis/ infiltrate has developed since the prior study. Right lung is clear. Negative for edema or effusion.  IMPRESSION: Left lower lobe atelectasis/pneumonia.   Electronically Signed   By: Franchot Gallo M.D.   On: 04/01/2014 14:45    Medications: I have reviewed the patient's current medications.  Assessment/Plan: Principal Problem:   Syncope  Suspect this was orthostatic,? bradyarrythymias from metoprolol, check orthostatic vitals today.  Metoprolol and lasix on hold, follow on telemetry.  Active Problems:   DM (diabetes mellitus) with complications follow   Hypertension BP now running low, holding all BP meds.  CAD- known RCA disease- medical Rx, troponins negative   Chronic indwelling Foley catheter   Chronic diastolic CHF (congestive heart failure) well compensated. No need of venous doppler US. Disposition, hope to D/C back to nursing home tomorrow or monday    LOS: 1 day   [facility] 04/02/2014, 7:44 AM

## 2014-04-02 NOTE — Evaluation (Signed)
Physical Therapy Evaluation Patient Details Name: [name] MRN: [id number] DOB: 07/29/1946 Today's Date: 04/02/2014   History of Present Illness  [name] is a 68 y.o. male with past medical history of morbid obesity, chronic Foley catheter and chronic diastolic CHF, Recent admit 7/12 with D/C to SNF readmitted with syncope  Clinical Impression  On initial eval attempt pt very clear he did not want therapy and with pt request signed off. RN later stated pt decided for therapy and reordered. Pt flat with ability to perform mobility during eval but not completely receptive to cues for sequence as he appears to want to control his environment. Pt with decreased strength and function who will benefit from acute therapy to maximize mobility and gait to decrease burden of care. EOB with return to sitting pt momentarily shifted his head to the side as if passing out but no loss of trunk control and following commands yet pt stating he blacked out and doesn't recall sitting. RN Raquel Sarna present during this instance with BP 154/87 and HR 100 EOB. Will follow.     Follow Up Recommendations SNF;Supervision/Assistance - 24 hour    Equipment Recommendations  None recommended by PT    Recommendations for Other Services       Precautions / Restrictions Precautions Precautions: Fall      Mobility  Bed Mobility Overal bed mobility: Needs Assistance Bed Mobility: Supine to Sit;Sit to Supine     Supine to sit: HOB elevated;Min guard Sit to supine: Min assist   General bed mobility comments: pt refused HOB flat for transfer to sitting, sleeps in recliner at home and has bed elevated at sNF. Use of rail for supine to sit and assist to bring legs onto surface with return to supine  Transfers Overall transfer level: Needs assistance   Transfers: Sit to/from Stand Sit to Stand: Min assist;From elevated surface         General transfer comment: minguard to stand from bed elevated  grossly 4 inches with cues for hand placement, to sit pt without control of descent despite cues  Ambulation/Gait Ambulation/Gait assistance: Min guard Ambulation Distance (Feet): 20 Feet Assistive device: Rolling walker (2 wheeled) Gait Pattern/deviations: Step-through pattern;Decreased stride length;Trunk flexed   Gait velocity interpretation: <1.8 ft/sec, indicative of risk for recurrent falls General Gait Details: cues for posture and position in RW  Stairs            Wheelchair Mobility    Modified Rankin (Stroke Patients Only)       Balance Overall balance assessment: Needs assistance;History of Falls   Sitting balance-Leahy Scale: Fair       Standing balance-Leahy Scale: Fair                               Pertinent Vitals/Pain No pain Orthostatic BPs  Supine 158/70  Sitting 141/94     Standing 161/102          Home Living Family/patient expects to be discharged to:: Skilled nursing facility Living Arrangements: Alone Available Help at Discharge: Friend(s);Available PRN/intermittently   Home Access: Stairs to enter Entrance Stairs-Rails: Right;Left Entrance Stairs-Number of Steps: 1 Home Layout: One level Home Equipment: Cane - single point;Grab bars - toilet;Grab bars - tub/shower;Electric scooter;Wheelchair - [employer] - 2 wheels      Prior Function Level of Independence: Needs assistance   Gait / Transfers Assistance Needed: pt states he hasn't really walked at sNF  ADL's / Homemaking Assistance Needed: assist for lower body and back bathing and dressing. Prior to [facility] was sponge bathing at home        Hand Dominance        Extremity/Trunk Assessment   Upper Extremity Assessment: Generalized weakness           Lower Extremity Assessment: Generalized weakness      Cervical / Trunk Assessment: Normal  Communication   Communication: No difficulties  Cognition Arousal/Alertness: Awake/alert Behavior During  Therapy: WFL for tasks assessed/performed   Area of Impairment: Orientation Orientation Level: Time;Situation                  General Comments      Exercises        Assessment/Plan    PT Assessment Patient needs continued PT services  PT Diagnosis Generalized weakness;Difficulty walking   PT Problem List Decreased strength;Decreased activity tolerance;Decreased balance;Decreased mobility;Decreased safety awareness;Decreased knowledge of use of DME;Obesity  PT Treatment Interventions DME instruction;Gait training;Functional mobility training;Therapeutic exercise;Patient/family education;Therapeutic activities   PT Goals (Current goals can be found in the Care Plan section) Acute Rehab PT Goals Patient Stated Goal: be able to return home PT Goal Formulation: With patient Time For Goal Achievement: 04/16/14 Potential to Achieve Goals: Fair    Frequency Min 2X/week   Barriers to discharge Decreased caregiver support      Co-evaluation               End of Session   Activity Tolerance: Patient limited by fatigue Patient left: in bed;with call bell/phone within reach;with nursing/sitter in room Nurse Communication: Mobility status;Precautions         Time: [id number] PT Time Calculation (min): 25 min   Charges:   PT Evaluation $Initial PT Evaluation Tier I: 1 Procedure PT Treatments $Therapeutic Activity: 8-22 mins   PT G Codes:          Melford Aase 04/02/2014, 12:58 PM Elwyn Reach, [place]

## 2014-04-02 NOTE — Progress Notes (Signed)
PT Cancellation Note/ Discharge  Patient Details Name: [name] MRN: [id number] DOB: 1946/06/12   Cancelled Treatment:    Reason Eval/Treat Not Completed: Other (comment) (entered room during nurse tech visit for orthostatic vitals to assist with mobility but pt refused any therapy and would not even continue orthostatic vitals. Pt states he does not want P.T. eval at this time and will discuss with MD. Will sign off at this point per pt request. Please reorder should pt desire to participate. )   Melford Aase 04/02/2014, 10:58 AM Elwyn Reach, [place]

## 2014-04-02 NOTE — Progress Notes (Signed)
Clinical Social Work Department BRIEF PSYCHOSOCIAL ASSESSMENT 04/02/2014  Patient:  [name], [name]     Account Number:  [id number]     Admit date:  04/01/2014  Clinical Social Worker:  Megan Salon  Date/Time:  04/02/2014 11:04 AM  Referred by:  Physician  Date Referred:  04/02/2014 Referred for  SNF Placement   Other Referral:   Interview type:  Patient Other interview type:    PSYCHOSOCIAL DATA Living Status:  FACILITY Admitted from facility:  [facility] Level of care:  [place] Primary support name:  Burlene Arnt Primary support relationship to patient:  FRIEND Degree of support available:   Pt states he does not have a lot of support    CURRENT CONCERNS Current Concerns  Post-Acute Placement   Other Concerns:    SOCIAL WORK ASSESSMENT / PLAN [name] received consult that patient is from [facility]. [name] went into room and introduced self to patient and explained social worker role. Patient stated he is from [facility] for rehab. Patient states he would like to go back there when he is medically ready. [name] asked patient if social worker could call family and patient stated he does not have any. Patient states he does not have a lot of support and lives at home by himself. [name] will complete FL2 and continue to follow patient for dc plans back to [facility]. when medically stable.   Assessment/plan status:  Psychosocial Support/Ongoing Assessment of Needs Other assessment/ plan:   Information/referral to community resources:   [name] information/SNF information    PATIENT'S/FAMILY'S RESPONSE TO PLAN OF CARE: Patient states he plans on going back to [facility] for rehab when ready. Patient states he hopes he gets his same room. Patient was in good spirits during social work visit.     Jeanette Caprice, MSW, [place]

## 2014-04-02 NOTE — Progress Notes (Signed)
Pt found in room standing next to bedside commode HR in the 160s attempting to return to bed. Pt assisted to return to bed, HR returned to baseline. Pt refusing bed alarm.  Pt re-educated on fall safety precautions. Pt continues to refuse bed alarm. Will continue to reinforce fall prevention safety precautions. Lenna Sciara

## 2014-04-03 DIAGNOSIS — K219 Gastro-esophageal reflux disease without esophagitis: Secondary | ICD-10-CM | POA: Diagnosis not present

## 2014-04-03 DIAGNOSIS — Z87891 Personal history of nicotine dependence: Secondary | ICD-10-CM | POA: Diagnosis not present

## 2014-04-03 DIAGNOSIS — I739 Peripheral vascular disease, unspecified: Secondary | ICD-10-CM | POA: Diagnosis not present

## 2014-04-03 DIAGNOSIS — C384 Malignant neoplasm of pleura: Secondary | ICD-10-CM | POA: Diagnosis not present

## 2014-04-03 DIAGNOSIS — R55 Syncope and collapse: Secondary | ICD-10-CM | POA: Diagnosis not present

## 2014-04-03 DIAGNOSIS — Z7982 Long term (current) use of aspirin: Secondary | ICD-10-CM | POA: Diagnosis not present

## 2014-04-03 DIAGNOSIS — E119 Type 2 diabetes mellitus without complications: Secondary | ICD-10-CM | POA: Diagnosis not present

## 2014-04-03 DIAGNOSIS — G4733 Obstructive sleep apnea (adult) (pediatric): Secondary | ICD-10-CM | POA: Diagnosis not present

## 2014-04-03 DIAGNOSIS — R42 Dizziness and giddiness: Secondary | ICD-10-CM | POA: Diagnosis not present

## 2014-04-03 DIAGNOSIS — IMO0001 Reserved for inherently not codable concepts without codable children: Secondary | ICD-10-CM | POA: Diagnosis not present

## 2014-04-03 DIAGNOSIS — I509 Heart failure, unspecified: Secondary | ICD-10-CM | POA: Diagnosis not present

## 2014-04-03 DIAGNOSIS — R32 Unspecified urinary incontinence: Secondary | ICD-10-CM | POA: Diagnosis not present

## 2014-04-03 DIAGNOSIS — R262 Difficulty in walking, not elsewhere classified: Secondary | ICD-10-CM | POA: Diagnosis not present

## 2014-04-03 DIAGNOSIS — Z8669 Personal history of other diseases of the nervous system and sense organs: Secondary | ICD-10-CM | POA: Diagnosis not present

## 2014-04-03 DIAGNOSIS — I10 Essential (primary) hypertension: Secondary | ICD-10-CM | POA: Diagnosis not present

## 2014-04-03 DIAGNOSIS — E669 Obesity, unspecified: Secondary | ICD-10-CM | POA: Diagnosis not present

## 2014-04-03 DIAGNOSIS — E1142 Type 2 diabetes mellitus with diabetic polyneuropathy: Secondary | ICD-10-CM | POA: Diagnosis not present

## 2014-04-03 DIAGNOSIS — E785 Hyperlipidemia, unspecified: Secondary | ICD-10-CM | POA: Diagnosis not present

## 2014-04-03 DIAGNOSIS — Z8673 Personal history of transient ischemic attack (TIA), and cerebral infarction without residual deficits: Secondary | ICD-10-CM | POA: Diagnosis not present

## 2014-04-03 DIAGNOSIS — Z794 Long term (current) use of insulin: Secondary | ICD-10-CM | POA: Diagnosis not present

## 2014-04-03 DIAGNOSIS — M6281 Muscle weakness (generalized): Secondary | ICD-10-CM | POA: Diagnosis not present

## 2014-04-03 DIAGNOSIS — Z79899 Other long term (current) drug therapy: Secondary | ICD-10-CM | POA: Diagnosis not present

## 2014-04-03 DIAGNOSIS — I2510 Atherosclerotic heart disease of native coronary artery without angina pectoris: Secondary | ICD-10-CM | POA: Diagnosis not present

## 2014-04-03 DIAGNOSIS — N319 Neuromuscular dysfunction of bladder, unspecified: Secondary | ICD-10-CM | POA: Diagnosis not present

## 2014-04-03 DIAGNOSIS — N39498 Other specified urinary incontinence: Secondary | ICD-10-CM | POA: Diagnosis not present

## 2014-04-03 DIAGNOSIS — R319 Hematuria, unspecified: Secondary | ICD-10-CM | POA: Diagnosis not present

## 2014-04-03 DIAGNOSIS — R404 Transient alteration of awareness: Secondary | ICD-10-CM | POA: Diagnosis not present

## 2014-04-03 LAB — GLUCOSE, CAPILLARY
GLUCOSE-CAPILLARY: 193 mg/dL — AB (ref 70–99)
Glucose-Capillary: 195 mg/dL — ABNORMAL HIGH (ref 70–99)

## 2014-04-03 MED ORDER — FUROSEMIDE 40 MG PO TABS: 40.0000 mg | ORAL_TABLET | Freq: Every day | ORAL | Status: DC

## 2014-04-03 MED ORDER — METOPROLOL TARTRATE 25 MG PO TABS: 25.0000 mg | ORAL_TABLET | Freq: Two times a day (BID) | ORAL | Status: DC

## 2014-04-03 MED ORDER — METOPROLOL TARTRATE 25 MG PO TABS
25.0000 mg | ORAL_TABLET | Freq: Two times a day (BID) | ORAL | Status: DC
  Administered 2014-04-03: 25 mg via ORAL
  Filled 2014-04-03 (×2): qty 1

## 2014-04-03 NOTE — Discharge Instructions (Signed)
Diabetes Mellitus and Food It is important for you to manage your blood sugar (glucose) level. Your blood glucose level can be greatly affected by what you eat. Eating healthier foods in the appropriate amounts throughout the day at about the same time each day will help you control your blood glucose level. It can also help slow or prevent worsening of your diabetes mellitus. Healthy eating may even help you improve the level of your blood pressure and reach or maintain a healthy weight.  HOW CAN FOOD AFFECT ME? Carbohydrates Carbohydrates affect your blood glucose level more than any other type of food. Your dietitian will help you determine how many carbohydrates to eat at each meal and teach you how to count carbohydrates. Counting carbohydrates is important to keep your blood glucose at a healthy level, especially if you are using insulin or taking certain medicines for diabetes mellitus. Alcohol Alcohol can cause sudden decreases in blood glucose (hypoglycemia), especially if you use insulin or take certain medicines for diabetes mellitus. Hypoglycemia can be a life-threatening condition. Symptoms of hypoglycemia (sleepiness, dizziness, and disorientation) are similar to symptoms of having too much alcohol.  If your health care provider has given you approval to drink alcohol, do so in moderation and use the following guidelines:  Women should not have more than one drink per day, and men should not have more than two drinks per day. One drink is equal to:  12 oz of beer.  5 oz of wine.  1 oz of hard liquor.  Do not drink on an empty stomach.  Keep yourself hydrated. Have water, diet soda, or unsweetened iced tea.  Regular soda, juice, and other mixers might contain a lot of carbohydrates and should be counted. WHAT FOODS ARE NOT RECOMMENDED? As you make food choices, it is important to remember that all foods are not the same. Some foods have fewer nutrients per serving than other  foods, even though they might have the same number of calories or carbohydrates. It is difficult to get your body what it needs when you eat foods with fewer nutrients. Examples of foods that you should avoid that are high in calories and carbohydrates but low in nutrients include:  Trans fats (most processed foods list trans fats on the Nutrition Facts label).  Regular soda.  Juice.  Candy.  Sweets, such as cake, pie, doughnuts, and cookies.  Fried foods. WHAT FOODS CAN I EAT? Have nutrient-rich foods, which will nourish your body and keep you healthy. The food you should eat also will depend on several factors, including:  The calories you need.  The medicines you take.  Your weight.  Your blood glucose level.  Your blood pressure level.  Your cholesterol level. You also should eat a variety of foods, including:  Protein, such as meat, poultry, fish, tofu, nuts, and seeds (lean animal proteins are best).  Fruits.  Vegetables.  Dairy products, such as milk, cheese, and yogurt (low fat is best).  Breads, grains, pasta, cereal, rice, and beans.  Fats such as olive oil, trans fat-free margarine, canola oil, avocado, and olives. DOES EVERYONE WITH DIABETES MELLITUS HAVE THE SAME MEAL PLAN? Because every person with diabetes mellitus is different, there is not one meal plan that works for everyone. It is very important that you meet with a dietitian who will help you create a meal plan that is just right for you. Document Released: 05/24/2005 Document Revised: 09/01/2013 Document Reviewed: 07/24/2013 ExitCare Patient Information 2015 [employer]. This   information is not intended to replace advice given to you by your health care provider. Make sure you discuss any questions you have with your health care provider.  

## 2014-04-03 NOTE — Progress Notes (Signed)
Subjective: Feels well no complaints  Objective: Vital signs in last 24 hours: Temp:  [97.6 F (36.4 C)] 97.6 F (36.4 C) (07/25 0516) Pulse Rate:  [75-84] 75 (07/25 0516) Resp:  [18-20] 18 (07/25 0516) BP: (130-142)/(42-63) 142/63 mmHg (07/25 0516) SpO2:  [95 %-99 %] 99 % (07/25 0516) Weight:  [159.7 kg (352 lb 1.2 oz)] 159.7 kg (352 lb 1.2 oz) (07/25 0516) Weight change: -3.141 kg (-6 lb 14.8 oz) Last BM Date: 04/02/14  Intake/Output from previous day: 07/24 0701 - 07/25 0700 In: -  Out: 1800 [Urine:1800] Intake/Output this shift: Total I/O In: 740 [P.O.:240; Other:500] Out: 500 [Urine:500]  General appearance: alert and cooperative Resp: clear to auscultation bilaterally Cardio: regular rate and rhythm, S1, S2 normal, no murmur, click, rub or gallop Extremities: extremities normal, atraumatic, no cyanosis or edema  Lab Results:  Recent Labs  04/01/14 1431  WBC 7.8  HGB 15.4  HCT 45.9  PLT 281   BMET  Recent Labs  04/01/14 1431 04/02/14 0030  NA 135* 139  K 4.0 4.5  CL 96 97  CO2 29 28  GLUCOSE 173* 179*  BUN 9 11  CREATININE 0.69 0.70  CALCIUM 8.9 9.0    Studies/Results: Dg Chest Port 1 View  04/01/2014   CLINICAL DATA:  Chest pain  EXAM: PORTABLE CHEST - 1 VIEW  COMPARISON:  03/23/2014  FINDINGS: Left lower lobe atelectasis/ infiltrate has developed since the prior study. Right lung is clear. Negative for edema or effusion.  IMPRESSION: Left lower lobe atelectasis/pneumonia.   Electronically Signed   By: Franchot Gallo M.D.   On: 04/01/2014 14:45    Medications: I have reviewed the patient's current medications.  Assessment/Plan: Principal Problem:  Syncope Suspect this was orthostatic,no significant arrythymia on telemetry.  orthostatic vitals negative today.  Restart Metoprolol and lasix at lower doses.  Active Problems:  DM (diabetes mellitus) with complications follow  Hypertension BP now running low, holding all BP meds.  CAD- known RCA  disease- medical Rx, troponins negative  Chronic indwelling Foley catheter  Chronic diastolic CHF (congestive heart failure) well compensated.  Disposition, hope to D/C back to nursing home today    LOS: 2 days   Rosalita Carey JOSEPH 04/03/2014, 11:10 AM

## 2014-04-03 NOTE — Clinical Social Work Note (Signed)
[name] made aware by RN patient ready for d/c to [facility]. [name] contacted facility and confirmed bed availability. [name] met with patient who is agreeable to return. [name] contacted patient's friend Gerald Stabs [place]) and made him aware of patient d/c to facility. [name] faxed d/c summary to facility and prepared d/c packet. [name] placed d/c packet in patient's shadow chart. [name] provided RN Janett Billow) with number for report. [name] to arrange transportation via [place]. No further needs. [name] signing off.  [place], [place] Weekend Clinical Social Worker [phone]

## 2014-04-03 NOTE — Progress Notes (Signed)
Report called to [facility], Spoke to [place], [place]

## 2014-04-03 NOTE — Discharge Summary (Signed)
Physician Discharge Summary  Patient ID: [name] MRN: [id number] DOB/AGE: 1946/05/16 68 y.o.  Admit date: 04/01/2014 Discharge date: 04/03/2014  Admission Diagnoses: Syncope Diabetes mellitus with complications Diabetic neuropathy Hypertension Coronary artery disease, RCA disease Chronic indwelling Foley catheter Diastolic congestive heart failure   Discharge Diagnoses:  Principal Problem:   Syncope Active Problems:   DM (diabetes mellitus) with complications   Diabetic neuropathy, painful   Hypertension   Dizziness   CAD- known RCA disease- medical Rx   Chronic indwelling Foley catheter   Chronic diastolic CHF (congestive heart failure) History of CVA   Discharged Condition: good  Hospital Course: The patient was admitted on July 23 after experiencing a syncopal episode while doing physical threapy. He had been complaining of occasional chest discomfort and shortness of breath while doing physical therapy. On the day of admission he had been getting up from a seated position and started to feel dizzy with some chest discomfort. He had a presyncopal episode. Cardiac catheterization 2013 did show distal right coronary artery disease with a diffuse long segment up to 90% stenosis, 50% stenosis proximal large first diagonal and diastolic dysfunction. This is been treated medically. In emergency department the patient's blood pressures were in the upper 90s to low 100s. He was admitted and metoprolol and furosemide were held. He was given about 1 L of IV fluids. By the next day he was feeling better and orthostatics vital signs did not show any evidence of orthostatic hypotension. He was monitored on telemetry and showed no significant arrhythmias. He was noted to have sinus tachycardia when he stood and try to walk. Troponins were all negative. EKG was unchanged. His CHF diastolic dysfunction remained well compensated. Blood sugars under fair control. Physical therapy saw patient  on July 24 and was no significant NSAID of severe dizziness or syncope. Because of his blood pressures being on the low side and concern for orthostatic drops in blood pressure his metoprolol dose and Lasix dose was decreased by half. He'll need to be monitored for congestive heart failure as an outpatient  Consults: None  Significant Diagnostic Studies: labs: July 24 sodium 139, potassium 4.5, chloride 97, bicarbonate 28, glucose 179, BUN 11, creatinine 0.7, TSH 2.0, BNP 658 and radiology: CXR: Left lower lobe atelectasis  Treatments: IV hydration, cardiac meds: metoprolol and therapies: PT  Discharge Exam: Blood pressure 142/63, pulse 75, temperature 97.6 F (36.4 C), temperature source Oral, resp. rate 18, height 6' (1.829 m), weight 159.7 kg (352 lb 1.2 oz), SpO2 99.00%. General appearance: alert and cooperative Resp: clear to auscultation bilaterally Cardio: regular rate and rhythm, S1, S2 normal, no murmur, click, rub or gallop  Disposition: 03-Skilled Nursing Facility     Medication List    STOP taking these medications       cyclobenzaprine 10 MG tablet  Commonly known as:  FLEXERIL      TAKE these medications       ammonium lactate 12 % cream  Commonly known as:  AMLACTIN  Apply topically 2 (two) times daily. For xerosis     aspirin EC 81 MG tablet  Take 81 mg by mouth every morning.     diphenhydrAMINE 25 mg capsule  Commonly known as:  BENADRYL  Take 25 mg by mouth every 6 (six) hours as needed. For allergy.     furosemide 40 MG tablet  Commonly known as:  LASIX  Take 1 tablet (40 mg total) by mouth daily.     HYDROcodone-acetaminophen 5-325  MG per tablet  Commonly known as:  NORCO/VICODIN  Take 1 tablet by mouth every 4 (four) hours as needed for severe pain.     insulin aspart protamine- aspart (70-30) 100 UNIT/ML injection  Commonly known as:  NOVOLOG MIX 70/30  Inject 0.55 mLs (55 Units total) into the skin 2 (two) times daily with a meal.      loperamide 2 MG tablet  Commonly known as:  IMODIUM A-D  Take 2 mg by mouth 4 (four) times daily as needed for diarrhea or loose stools.     metoprolol tartrate 25 MG tablet  Commonly known as:  LOPRESSOR  Take 1 tablet (25 mg total) by mouth 2 (two) times daily.     mupirocin ointment 2 %  Commonly known as:  BACTROBAN  Place 1 application into the nose 2 (two) times daily.     nitroGLYCERIN 0.4 MG SL tablet  Commonly known as:  NITROSTAT  Place 0.4 mg under the tongue every 5 (five) minutes as needed for chest pain.     omeprazole 20 MG capsule  Commonly known as:  PRILOSEC  Take 20 mg by mouth daily.     oxybutynin 5 MG tablet  Commonly known as:  DITROPAN  Take 5 mg by mouth 3 (three) times daily as needed for bladder spasms.     potassium chloride SA 20 MEQ tablet  Commonly known as:  K-DUR,KLOR-CON  Take 1 tablet (20 mEq total) by mouth daily.           Follow-up Information   Follow up with Irven Shelling, MD. (1-2 weeks after discharge from nursing home)    Specialty:  Internal Medicine   Contact information:   [address], [address] [place] [address] [phone]       Signed: Irven Shelling 04/03/2014, 12:10 PM

## 2014-04-08 DIAGNOSIS — E669 Obesity, unspecified: Secondary | ICD-10-CM | POA: Diagnosis not present

## 2014-04-08 DIAGNOSIS — E119 Type 2 diabetes mellitus without complications: Secondary | ICD-10-CM | POA: Diagnosis not present

## 2014-04-08 DIAGNOSIS — I509 Heart failure, unspecified: Secondary | ICD-10-CM | POA: Diagnosis not present

## 2014-04-08 DIAGNOSIS — I2510 Atherosclerotic heart disease of native coronary artery without angina pectoris: Secondary | ICD-10-CM | POA: Diagnosis not present

## 2014-04-26 ENCOUNTER — Emergency Department (HOSPITAL_COMMUNITY)
Admission: EM | Admit: 2014-04-26 | Discharge: 2014-04-26 | Disposition: A | Discharge status: Home / Self Care | Payer: Medicare Other | Attending: Emergency Medicine | Admitting: Emergency Medicine

## 2014-04-26 ENCOUNTER — Encounter (HOSPITAL_COMMUNITY): Payer: Self-pay | Admitting: Emergency Medicine

## 2014-04-26 DIAGNOSIS — Z87891 Personal history of nicotine dependence: Secondary | ICD-10-CM | POA: Insufficient documentation

## 2014-04-26 DIAGNOSIS — Z8669 Personal history of other diseases of the nervous system and sense organs: Secondary | ICD-10-CM | POA: Insufficient documentation

## 2014-04-26 DIAGNOSIS — R55 Syncope and collapse: Secondary | ICD-10-CM | POA: Insufficient documentation

## 2014-04-26 DIAGNOSIS — R319 Hematuria, unspecified: Secondary | ICD-10-CM | POA: Diagnosis not present

## 2014-04-26 DIAGNOSIS — Z794 Long term (current) use of insulin: Secondary | ICD-10-CM | POA: Insufficient documentation

## 2014-04-26 DIAGNOSIS — I739 Peripheral vascular disease, unspecified: Secondary | ICD-10-CM | POA: Diagnosis not present

## 2014-04-26 DIAGNOSIS — Z8673 Personal history of transient ischemic attack (TIA), and cerebral infarction without residual deficits: Secondary | ICD-10-CM | POA: Insufficient documentation

## 2014-04-26 DIAGNOSIS — R32 Unspecified urinary incontinence: Secondary | ICD-10-CM | POA: Insufficient documentation

## 2014-04-26 DIAGNOSIS — Z79899 Other long term (current) drug therapy: Secondary | ICD-10-CM | POA: Insufficient documentation

## 2014-04-26 DIAGNOSIS — K219 Gastro-esophageal reflux disease without esophagitis: Secondary | ICD-10-CM | POA: Insufficient documentation

## 2014-04-26 DIAGNOSIS — I10 Essential (primary) hypertension: Secondary | ICD-10-CM | POA: Diagnosis not present

## 2014-04-26 DIAGNOSIS — Z7982 Long term (current) use of aspirin: Secondary | ICD-10-CM | POA: Insufficient documentation

## 2014-04-26 DIAGNOSIS — E119 Type 2 diabetes mellitus without complications: Secondary | ICD-10-CM | POA: Insufficient documentation

## 2014-04-26 DIAGNOSIS — E669 Obesity, unspecified: Secondary | ICD-10-CM | POA: Insufficient documentation

## 2014-04-26 LAB — URINALYSIS, ROUTINE W REFLEX MICROSCOPIC
BILIRUBIN URINE: NEGATIVE
Glucose, UA: NEGATIVE mg/dL
Ketones, ur: NEGATIVE mg/dL
NITRITE: NEGATIVE
PH: 6 (ref 5.0–8.0)
Protein, ur: 100 mg/dL — AB
Specific Gravity, Urine: 1.022 (ref 1.005–1.030)
UROBILINOGEN UA: 1 mg/dL (ref 0.0–1.0)

## 2014-04-26 LAB — CBC
HCT: 40.8 % (ref 39.0–52.0)
Hemoglobin: 14.1 g/dL (ref 13.0–17.0)
MCH: 30.6 pg (ref 26.0–34.0)
MCHC: 34.6 g/dL (ref 30.0–36.0)
MCV: 88.5 fL (ref 78.0–100.0)
PLATELETS: 231 10*3/uL (ref 150–400)
RBC: 4.61 MIL/uL (ref 4.22–5.81)
RDW: 13.1 % (ref 11.5–15.5)
WBC: 8.1 10*3/uL (ref 4.0–10.5)

## 2014-04-26 LAB — BASIC METABOLIC PANEL
ANION GAP: 11 (ref 5–15)
BUN: 12 mg/dL (ref 6–23)
CALCIUM: 9.2 mg/dL (ref 8.4–10.5)
CO2: 27 mEq/L (ref 19–32)
Chloride: 100 mEq/L (ref 96–112)
Creatinine, Ser: 0.79 mg/dL (ref 0.50–1.35)
GFR calc Af Amer: >90 mL/min (ref >90)
Glucose, Bld: 75 mg/dL (ref 70–99)
POTASSIUM: 3.9 meq/L (ref 3.7–5.3)
SODIUM: 138 meq/L (ref 137–147)

## 2014-04-26 LAB — CBG MONITORING, ED: GLUCOSE-CAPILLARY: 71 mg/dL (ref 70–99)

## 2014-04-26 LAB — URINE MICROSCOPIC-ADD ON

## 2014-04-26 MED ORDER — LIDOCAINE HCL 2 % EX GEL
CUTANEOUS | Status: AC
  Administered 2014-04-26: 21:00:00
  Filled 2014-04-26: qty 10

## 2014-04-26 MED ORDER — SODIUM CHLORIDE 0.9 % IV BOLUS (SEPSIS): 1000.0000 mL | Freq: Once | INTRAVENOUS | Status: DC

## 2014-04-26 NOTE — ED Provider Notes (Signed)
CSN: [id number]     Arrival date & time 04/26/14  1931 History   First MD Initiated Contact with Patient 04/26/14 1938     Chief Complaint  Patient presents with  . Urinary Incontinence     (Consider location/radiation/quality/duration/timing/severity/associated sxs/prior Treatment) HPI Comments: Hx of syncope with transfers at his nursing home. Has had syncopal events when transferring with staff from his bed to his chair. Out for a few seconds, no preceding symptoms.   Patient is a 68 y.o. male presenting with male genitourinary complaint and syncope. The history is provided by the patient.  Male GU Problem Presenting symptoms: no dysuria, no penile discharge, no penile pain and no scrotal pain   Presenting symptoms comment:  Hematuria Context: not after injury, not after intercourse, not after urination and not during intercourse   Relieved by:  Nothing Worsened by:  Nothing tried Associated symptoms: hematuria   Associated symptoms: no abdominal pain, no fever, no penile redness, no penile swelling, no priapism, no urinary hesitation, no urinary incontinence and no vomiting   Loss of Consciousness Episode history:  Single Most recent episode:  Today Timing:  Constant Progression:  Resolved Chronicity:  Recurrent Context comment:  Movement Witnessed: yes   Relieved by:  Nothing Worsened by:  Nothing tried Associated symptoms: no fever, no shortness of breath and no vomiting     Past Medical History  Diagnosis Date  . Obese   . Hypertension   . Diabetes mellitus   . Diabetic diarrhea   . Sleep apnea   . Diabetic neuropathy, painful   . Chronic indwelling Foley catheter   . Bladder spasms   . Complication of anesthesia   . PONV (postoperative nausea and vomiting)   . Hyperlipidemia   . GERD (gastroesophageal reflux disease)   . Neurogenic bladder   . Stroke 2013   Past Surgical History  Procedure Laterality Date  . Right tkr    . Gastroplasty    .  Cholecystectomy    . Total knee arthroplasty     Family History  Problem Relation Age of Onset  . Hypertension Mother   . Diabetes type I Father   . Cancer Brother      Testicular Cancer  . Cancer Brother     Leukemia   History  Substance Use Topics  . Smoking status: Former Smoker -- 2.00 packs/day for 20 years    Quit date: 09/10/1976  . Smokeless tobacco: Never Used  . Alcohol Use: No     Comment: FORMER ALCOHOLIC WITH 23 YEAR SOBRIETY    Review of Systems  Constitutional: Negative for fever.  Respiratory: Negative for cough and shortness of breath.   Cardiovascular: Positive for syncope.  Gastrointestinal: Negative for vomiting and abdominal pain.  Genitourinary: Positive for hematuria. Negative for bladder incontinence, dysuria, hesitancy, discharge, penile swelling and penile pain.  All other systems reviewed and are negative.     Allergies  Ace inhibitors; Lipitor; Metformin and related; Cefadroxil; Cephalexin; Methocarbamol; Morphine and related; and Robaxin  Home Medications   Prior to Admission medications   Medication Sig Start Date End Date Taking? Authorizing Provider  ammonium lactate (AMLACTIN) 12 % cream Apply topically 2 (two) times daily. For xerosis    Historical Provider, MD  aspirin EC 81 MG tablet Take 81 mg by mouth every morning.     Historical Provider, MD  diphenhydrAMINE (BENADRYL) 25 mg capsule Take 25 mg by mouth every 6 (six) hours as needed. For allergy.  Historical Provider, MD  furosemide (LASIX) 40 MG tablet Take 1 tablet (40 mg total) by mouth daily. 04/03/14   Irven Shelling, MD  HYDROcodone-acetaminophen (NORCO/VICODIN) 5-325 MG per tablet Take 1 tablet by mouth every 4 (four) hours as needed for severe pain. 12/25/13   Erlene Quan, PA-C  insulin aspart protamine- aspart (NOVOLOG MIX 70/30) (70-30) 100 UNIT/ML injection Inject 0.55 mLs (55 Units total) into the skin 2 (two) times daily with a meal. 03/24/14   Irven Shelling,  MD  loperamide (IMODIUM A-D) 2 MG tablet Take 2 mg by mouth 4 (four) times daily as needed for diarrhea or loose stools.    Historical Provider, MD  metoprolol tartrate (LOPRESSOR) 25 MG tablet Take 1 tablet (25 mg total) by mouth 2 (two) times daily. 04/03/14   Irven Shelling, MD  mupirocin ointment (BACTROBAN) 2 % Place 1 application into the nose 2 (two) times daily.    Historical Provider, MD  nitroGLYCERIN (NITROSTAT) 0.4 MG SL tablet Place 0.4 mg under the tongue every 5 (five) minutes as needed for chest pain.    Historical Provider, MD  omeprazole (PRILOSEC) 20 MG capsule Take 20 mg by mouth daily.    Historical Provider, MD  oxybutynin (DITROPAN) 5 MG tablet Take 5 mg by mouth 3 (three) times daily as needed for bladder spasms.    Historical Provider, MD  potassium chloride SA (K-DUR,KLOR-CON) 20 MEQ tablet Take 1 tablet (20 mEq total) by mouth daily. 03/24/14   Irven Shelling, MD   BP 134/69  Pulse 81  Temp(Src) 97.8 F (36.6 C) (Oral)  Resp 16  SpO2 96% Physical Exam  Nursing note and vitals reviewed. Constitutional: He is oriented to person, place, and time. He appears well-developed and well-nourished. No distress.  HENT:  Head: Normocephalic and atraumatic.  Mouth/Throat: Oropharynx is clear and moist. No oropharyngeal exudate.  Eyes: EOM are normal. Pupils are equal, round, and reactive to light.  Neck: Normal range of motion. Neck supple.  Cardiovascular: Normal rate and regular rhythm.  Exam reveals no friction rub.   No murmur heard. Pulmonary/Chest: Effort normal and breath sounds normal. No respiratory distress. He has no wheezes. He has no rales.  Abdominal: He exhibits no distension. There is no tenderness. There is no rebound.  Genitourinary:  Hematuria, no penile pain   Musculoskeletal: Normal range of motion. He exhibits no edema.  Neurological: He is alert and oriented to person, place, and time. No cranial nerve deficit. He exhibits normal muscle  tone. Coordination normal.  Skin: No rash noted. He is not diaphoretic.    ED Course  Procedures (including critical care time) Labs Review Labs Reviewed  URINALYSIS, ROUTINE W REFLEX MICROSCOPIC  CBC  BASIC METABOLIC PANEL  CBG MONITORING, ED    Imaging Review No results found.   EKG Interpretation   Date/Time:  Monday April 26 2014 19:36:48 EDT Ventricular Rate:  74 PR Interval:  236 QRS Duration: 125 QT Interval:  418 QTC Calculation: 464 R Axis:   -62 Text Interpretation:  Sinus rhythm Prolonged PR interval Left bundle  branch block Similar to prior Confirmed by Mingo Amber  MD, [place] ([id number]) on  04/26/2014 7:39:14 PM      MDM   Final diagnoses:  Syncope and collapse  Hematuria    68 year old male here with syncopal episode. History of recurrent syncope when moving around for him. Has been admitted twice in the past 4 months for syncope. Hx of similar events with transfers.  No preceding chest pain, shortness of breath, dizziness. Also noted that he started to have hematuria. History of chronic indwelling Foley for urinary incontinence. Unable to get bleeding stopped as he is bleeding around the catheter. Catheter out on arrival. Caude catheter placed by our charge nurse. Pink tinged urine returned. Denies any abdominal pain. He is on aspirin but no other anticoagulants. We'll check labs, orthostatics. UA with moderate leukocytes, negative nitrites - likely due to the hematuria. Hematuria likely traumatic. Once foley in, no clots, no further bleeding. Orthostatics positive. Patient refused IV and fluids. Ate a meal, states he's feeling much better and would like to go home. With multiple admissions for orthostatic syncope, will have him f/u with his doctor in 2-3 days.  Evelina Bucy, MD 04/26/14 [phone]

## 2014-04-26 NOTE — ED Notes (Signed)
Unable to get urine at this time due to irrigation.

## 2014-04-26 NOTE — ED Notes (Signed)
Patient refused IV and IV fluids at this time. Gave patient diet coke and ham sandwich, which he requested. Encouraged patient to drink water and he states he will later.

## 2014-04-26 NOTE — ED Notes (Signed)
Patient in via EMS. Patient c/o of penis bleeding that started this evening around 1700. Patient from [facility]. Patient had indwelling cath that he has had for years. The staff changed it today and noticed bleeding around cath and removed cath. Since patient continues to bleed. Per EMS on arrival patient depends was saturated with urine and blood of unknown amount. According to staff they has changed him three times. Patient has hx of urinary incontinence. EMS states when patient stood up at resident he was dizzy and once he layed down he was better.

## 2014-04-26 NOTE — Discharge Instructions (Signed)
Syncope °Syncope is a medical term for fainting or passing out. This means you lose consciousness and drop to the ground. People are generally unconscious for less than 5 minutes. You may have some muscle twitches for up to 15 seconds before waking up and returning to normal. Syncope occurs more often in older adults, but it can happen to anyone. While most causes of syncope are not dangerous, syncope can be a sign of a serious medical problem. It is important to seek medical care.  °CAUSES  °Syncope is caused by a sudden drop in blood flow to the brain. The specific cause is often not determined. Factors that can bring on syncope include: °· Taking medicines that lower blood pressure. °· Sudden changes in posture, such as standing up quickly. °· Taking more medicine than prescribed. °· Standing in one place for too long. °· Seizure disorders. °· Dehydration and excessive exposure to heat. °· Low blood sugar (hypoglycemia). °· Straining to have a bowel movement. °· Heart disease, irregular heartbeat, or other circulatory problems. °· Fear, emotional distress, seeing blood, or severe pain. °SYMPTOMS  °Right before fainting, you may: °· Feel dizzy or light-headed. °· Feel nauseous. °· See all white or all black in your field of vision. °· Have cold, clammy skin. °DIAGNOSIS  °Your health care provider will ask about your symptoms, perform a physical exam, and perform an electrocardiogram (ECG) to record the electrical activity of your heart. Your health care provider may also perform other heart or blood tests to determine the cause of your syncope which may include: °· Transthoracic echocardiogram (TTE). During echocardiography, sound waves are used to evaluate how blood flows through your heart. °· Transesophageal echocardiogram (TEE). °· Cardiac monitoring. This allows your health care provider to monitor your heart rate and rhythm in real time. °· Holter monitor. This is a portable device that records your  heartbeat and can help diagnose heart arrhythmias. It allows your health care provider to track your heart activity for several days, if needed. °· Stress tests by exercise or by giving medicine that makes the heart beat faster. °TREATMENT  °In most cases, no treatment is needed. Depending on the cause of your syncope, your health care provider may recommend changing or stopping some of your medicines. °HOME CARE INSTRUCTIONS °· Have someone stay with you until you feel stable. °· Do not drive, use machinery, or play sports until your health care provider says it is okay. °· Keep all follow-up appointments as directed by your health care provider. °· Lie down right away if you start feeling like you might faint. Breathe deeply and steadily. Wait until all the symptoms have passed. °· Drink enough fluids to keep your urine clear or pale yellow. °· If you are taking blood pressure or heart medicine, get up slowly and take several minutes to sit and then stand. This can reduce dizziness. °SEEK IMMEDIATE MEDICAL CARE IF:  °· You have a severe headache. °· You have unusual pain in the chest, abdomen, or back. °· You are bleeding from your mouth or rectum, or you have black or tarry stool. °· You have an irregular or very fast heartbeat. °· You have pain with breathing. °· You have repeated fainting or seizure-like jerking during an episode. °· You faint when sitting or lying down. °· You have confusion. °· You have trouble walking. °· You have severe weakness. °· You have vision problems. °If you fainted, call your local emergency services (911 in [place] Do not drive   yourself to the hospital.  °MAKE SURE YOU: °· Understand these instructions. °· Will watch your condition. °· Will get help right away if you are not doing well or get worse. °Document Released: 08/27/2005 Document Revised: 09/01/2013 Document Reviewed: 10/26/2011 °Exit[employer]® Patient Information ©2015 [employer]. This information is not intended to replace  advice given to you by your health care provider. Make sure you discuss any questions you have with your health care provider. ° °

## 2014-04-26 NOTE — ED Notes (Signed)
Report called to [facility] and facility.

## 2014-04-26 NOTE — ED Notes (Signed)
Pt states bleeding profusely when prior aide tried replacing foley when patient at home-pt states he has urinary retention-#20 coude inserted after lidocaine urojet instilled-no difficulty inserting catheter-irrigated-no blood or clots noted at this time-will observe

## 2014-04-26 NOTE — ED Notes (Signed)
Pt declines leg strap to stabilize foley-states "I don't want that on my leg because I might get a blood clot".

## 2014-04-30 ENCOUNTER — Other Ambulatory Visit: Payer: Self-pay | Admitting: *Deleted

## 2014-04-30 DIAGNOSIS — I10 Essential (primary) hypertension: Secondary | ICD-10-CM | POA: Diagnosis not present

## 2014-04-30 DIAGNOSIS — G4733 Obstructive sleep apnea (adult) (pediatric): Secondary | ICD-10-CM | POA: Diagnosis not present

## 2014-04-30 DIAGNOSIS — I509 Heart failure, unspecified: Secondary | ICD-10-CM | POA: Diagnosis not present

## 2014-04-30 DIAGNOSIS — E119 Type 2 diabetes mellitus without complications: Secondary | ICD-10-CM | POA: Diagnosis not present

## 2014-04-30 DIAGNOSIS — I739 Peripheral vascular disease, unspecified: Secondary | ICD-10-CM

## 2014-04-30 DIAGNOSIS — M79606 Pain in leg, unspecified: Secondary | ICD-10-CM

## 2014-05-11 DIAGNOSIS — I509 Heart failure, unspecified: Secondary | ICD-10-CM | POA: Diagnosis not present

## 2014-05-11 DIAGNOSIS — E119 Type 2 diabetes mellitus without complications: Secondary | ICD-10-CM | POA: Diagnosis not present

## 2014-05-11 DIAGNOSIS — G4733 Obstructive sleep apnea (adult) (pediatric): Secondary | ICD-10-CM | POA: Diagnosis not present

## 2014-05-21 ENCOUNTER — Encounter: Payer: Medicare Other | Admitting: Vascular Surgery

## 2014-05-21 ENCOUNTER — Encounter (HOSPITAL_COMMUNITY): Payer: Medicare Other

## 2014-05-24 DIAGNOSIS — Z794 Long term (current) use of insulin: Secondary | ICD-10-CM | POA: Diagnosis not present

## 2014-05-24 DIAGNOSIS — I2510 Atherosclerotic heart disease of native coronary artery without angina pectoris: Secondary | ICD-10-CM | POA: Diagnosis not present

## 2014-05-24 DIAGNOSIS — N319 Neuromuscular dysfunction of bladder, unspecified: Secondary | ICD-10-CM | POA: Diagnosis not present

## 2014-05-24 DIAGNOSIS — Z8673 Personal history of transient ischemic attack (TIA), and cerebral infarction without residual deficits: Secondary | ICD-10-CM | POA: Diagnosis not present

## 2014-05-24 DIAGNOSIS — Z466 Encounter for fitting and adjustment of urinary device: Secondary | ICD-10-CM | POA: Diagnosis not present

## 2014-05-24 DIAGNOSIS — E1140 Type 2 diabetes mellitus with diabetic neuropathy, unspecified: Secondary | ICD-10-CM | POA: Diagnosis not present

## 2014-05-24 DIAGNOSIS — E669 Obesity, unspecified: Secondary | ICD-10-CM | POA: Diagnosis not present

## 2014-05-24 DIAGNOSIS — I5032 Chronic diastolic (congestive) heart failure: Secondary | ICD-10-CM | POA: Diagnosis not present

## 2014-05-28 DIAGNOSIS — I2510 Atherosclerotic heart disease of native coronary artery without angina pectoris: Secondary | ICD-10-CM | POA: Diagnosis not present

## 2014-05-28 DIAGNOSIS — Z8673 Personal history of transient ischemic attack (TIA), and cerebral infarction without residual deficits: Secondary | ICD-10-CM | POA: Diagnosis not present

## 2014-05-28 DIAGNOSIS — Z466 Encounter for fitting and adjustment of urinary device: Secondary | ICD-10-CM | POA: Diagnosis not present

## 2014-05-28 DIAGNOSIS — E1140 Type 2 diabetes mellitus with diabetic neuropathy, unspecified: Secondary | ICD-10-CM | POA: Diagnosis not present

## 2014-05-28 DIAGNOSIS — I5032 Chronic diastolic (congestive) heart failure: Secondary | ICD-10-CM | POA: Diagnosis not present

## 2014-05-28 DIAGNOSIS — N319 Neuromuscular dysfunction of bladder, unspecified: Secondary | ICD-10-CM | POA: Diagnosis not present

## 2014-06-01 DIAGNOSIS — I10 Essential (primary) hypertension: Secondary | ICD-10-CM | POA: Diagnosis not present

## 2014-06-01 DIAGNOSIS — E1129 Type 2 diabetes mellitus with other diabetic kidney complication: Secondary | ICD-10-CM | POA: Diagnosis not present

## 2014-06-01 DIAGNOSIS — Z1331 Encounter for screening for depression: Secondary | ICD-10-CM | POA: Diagnosis not present

## 2014-06-01 DIAGNOSIS — N182 Chronic kidney disease, stage 2 (mild): Secondary | ICD-10-CM | POA: Diagnosis not present

## 2014-06-01 DIAGNOSIS — I739 Peripheral vascular disease, unspecified: Secondary | ICD-10-CM | POA: Diagnosis not present

## 2014-06-01 DIAGNOSIS — E1149 Type 2 diabetes mellitus with other diabetic neurological complication: Secondary | ICD-10-CM | POA: Diagnosis not present

## 2014-06-01 DIAGNOSIS — E1159 Type 2 diabetes mellitus with other circulatory complications: Secondary | ICD-10-CM | POA: Diagnosis not present

## 2014-06-01 DIAGNOSIS — E1142 Type 2 diabetes mellitus with diabetic polyneuropathy: Secondary | ICD-10-CM | POA: Diagnosis not present

## 2014-06-04 ENCOUNTER — Encounter: Payer: Self-pay | Admitting: Surgery

## 2014-06-07 ENCOUNTER — Encounter: Payer: Medicare Other | Admitting: Surgery

## 2014-06-07 ENCOUNTER — Encounter (HOSPITAL_COMMUNITY): Payer: Medicare Other

## 2014-06-24 ENCOUNTER — Encounter (HOSPITAL_COMMUNITY): Payer: Self-pay | Admitting: Emergency Medicine

## 2014-06-24 ENCOUNTER — Inpatient Hospital Stay (HOSPITAL_COMMUNITY)
Admission: EM | Admit: 2014-06-24 | Discharge: 2014-06-29 | DRG: 699 | Disposition: A | Discharge status: Skilled Nursing Facility | Payer: Medicare Other | Attending: Internal Medicine | Admitting: Internal Medicine

## 2014-06-24 ENCOUNTER — Inpatient Hospital Stay (HOSPITAL_COMMUNITY): Admit: 2014-06-24 | Discharge: 2014-06-24 | Disposition: A | Discharge status: Home / Self Care | Payer: Medicare Other

## 2014-06-24 DIAGNOSIS — R55 Syncope and collapse: Secondary | ICD-10-CM

## 2014-06-24 DIAGNOSIS — N319 Neuromuscular dysfunction of bladder, unspecified: Secondary | ICD-10-CM | POA: Diagnosis present

## 2014-06-24 DIAGNOSIS — Z79891 Long term (current) use of opiate analgesic: Secondary | ICD-10-CM | POA: Diagnosis not present

## 2014-06-24 DIAGNOSIS — M6281 Muscle weakness (generalized): Secondary | ICD-10-CM | POA: Diagnosis not present

## 2014-06-24 DIAGNOSIS — Z96651 Presence of right artificial knee joint: Secondary | ICD-10-CM | POA: Diagnosis present

## 2014-06-24 DIAGNOSIS — I359 Nonrheumatic aortic valve disorder, unspecified: Secondary | ICD-10-CM | POA: Diagnosis not present

## 2014-06-24 DIAGNOSIS — D72829 Elevated white blood cell count, unspecified: Secondary | ICD-10-CM | POA: Diagnosis present

## 2014-06-24 DIAGNOSIS — E1140 Type 2 diabetes mellitus with diabetic neuropathy, unspecified: Secondary | ICD-10-CM | POA: Diagnosis not present

## 2014-06-24 DIAGNOSIS — Z7982 Long term (current) use of aspirin: Secondary | ICD-10-CM | POA: Diagnosis not present

## 2014-06-24 DIAGNOSIS — Z978 Presence of other specified devices: Secondary | ICD-10-CM

## 2014-06-24 DIAGNOSIS — E1100 Type 2 diabetes mellitus with hyperosmolarity without nonketotic hyperglycemic-hyperosmolar coma (NKHHC): Secondary | ICD-10-CM | POA: Diagnosis not present

## 2014-06-24 DIAGNOSIS — I440 Atrioventricular block, first degree: Secondary | ICD-10-CM | POA: Diagnosis present

## 2014-06-24 DIAGNOSIS — E876 Hypokalemia: Secondary | ICD-10-CM | POA: Diagnosis present

## 2014-06-24 DIAGNOSIS — R310 Gross hematuria: Secondary | ICD-10-CM | POA: Diagnosis not present

## 2014-06-24 DIAGNOSIS — Z833 Family history of diabetes mellitus: Secondary | ICD-10-CM | POA: Diagnosis not present

## 2014-06-24 DIAGNOSIS — R1084 Generalized abdominal pain: Secondary | ICD-10-CM | POA: Diagnosis present

## 2014-06-24 DIAGNOSIS — T83028A Displacement of other indwelling urethral catheter, initial encounter: Principal | ICD-10-CM | POA: Diagnosis present

## 2014-06-24 DIAGNOSIS — E785 Hyperlipidemia, unspecified: Secondary | ICD-10-CM | POA: Diagnosis present

## 2014-06-24 DIAGNOSIS — K219 Gastro-esophageal reflux disease without esophagitis: Secondary | ICD-10-CM | POA: Diagnosis present

## 2014-06-24 DIAGNOSIS — Z87891 Personal history of nicotine dependence: Secondary | ICD-10-CM | POA: Diagnosis not present

## 2014-06-24 DIAGNOSIS — I5032 Chronic diastolic (congestive) heart failure: Secondary | ICD-10-CM

## 2014-06-24 DIAGNOSIS — Z794 Long term (current) use of insulin: Secondary | ICD-10-CM | POA: Diagnosis not present

## 2014-06-24 DIAGNOSIS — R600 Localized edema: Secondary | ICD-10-CM | POA: Diagnosis not present

## 2014-06-24 DIAGNOSIS — G4733 Obstructive sleep apnea (adult) (pediatric): Secondary | ICD-10-CM | POA: Diagnosis present

## 2014-06-24 DIAGNOSIS — I728 Aneurysm of other specified arteries: Secondary | ICD-10-CM | POA: Diagnosis not present

## 2014-06-24 DIAGNOSIS — R404 Transient alteration of awareness: Secondary | ICD-10-CM | POA: Diagnosis not present

## 2014-06-24 DIAGNOSIS — Z8673 Personal history of transient ischemic attack (TIA), and cerebral infarction without residual deficits: Secondary | ICD-10-CM | POA: Diagnosis not present

## 2014-06-24 DIAGNOSIS — Z79899 Other long term (current) drug therapy: Secondary | ICD-10-CM

## 2014-06-24 DIAGNOSIS — R531 Weakness: Secondary | ICD-10-CM

## 2014-06-24 DIAGNOSIS — I2510 Atherosclerotic heart disease of native coronary artery without angina pectoris: Secondary | ICD-10-CM | POA: Diagnosis present

## 2014-06-24 DIAGNOSIS — R109 Unspecified abdominal pain: Secondary | ICD-10-CM | POA: Diagnosis not present

## 2014-06-24 DIAGNOSIS — R339 Retention of urine, unspecified: Secondary | ICD-10-CM | POA: Diagnosis present

## 2014-06-24 DIAGNOSIS — E1165 Type 2 diabetes mellitus with hyperglycemia: Secondary | ICD-10-CM | POA: Diagnosis present

## 2014-06-24 DIAGNOSIS — T82898A Other specified complication of vascular prosthetic devices, implants and grafts, initial encounter: Secondary | ICD-10-CM | POA: Diagnosis not present

## 2014-06-24 DIAGNOSIS — I6623 Occlusion and stenosis of bilateral posterior cerebral arteries: Secondary | ICD-10-CM | POA: Diagnosis not present

## 2014-06-24 DIAGNOSIS — R200 Anesthesia of skin: Secondary | ICD-10-CM | POA: Diagnosis present

## 2014-06-24 DIAGNOSIS — Z6841 Body Mass Index (BMI) 40.0 and over, adult: Secondary | ICD-10-CM | POA: Diagnosis not present

## 2014-06-24 DIAGNOSIS — Z8249 Family history of ischemic heart disease and other diseases of the circulatory system: Secondary | ICD-10-CM | POA: Diagnosis not present

## 2014-06-24 DIAGNOSIS — Z9889 Other specified postprocedural states: Secondary | ICD-10-CM | POA: Diagnosis not present

## 2014-06-24 DIAGNOSIS — E118 Type 2 diabetes mellitus with unspecified complications: Secondary | ICD-10-CM | POA: Diagnosis not present

## 2014-06-24 DIAGNOSIS — R319 Hematuria, unspecified: Secondary | ICD-10-CM | POA: Diagnosis not present

## 2014-06-24 DIAGNOSIS — I6340 Cerebral infarction due to embolism of unspecified cerebral artery: Secondary | ICD-10-CM

## 2014-06-24 DIAGNOSIS — I10 Essential (primary) hypertension: Secondary | ICD-10-CM | POA: Diagnosis present

## 2014-06-24 DIAGNOSIS — Z466 Encounter for fitting and adjustment of urinary device: Secondary | ICD-10-CM | POA: Diagnosis not present

## 2014-06-24 DIAGNOSIS — I509 Heart failure, unspecified: Secondary | ICD-10-CM | POA: Diagnosis not present

## 2014-06-24 DIAGNOSIS — I639 Cerebral infarction, unspecified: Secondary | ICD-10-CM

## 2014-06-24 DIAGNOSIS — E669 Obesity, unspecified: Secondary | ICD-10-CM | POA: Diagnosis not present

## 2014-06-24 DIAGNOSIS — Z960 Presence of urogenital implants: Secondary | ICD-10-CM

## 2014-06-24 DIAGNOSIS — I259 Chronic ischemic heart disease, unspecified: Secondary | ICD-10-CM | POA: Diagnosis not present

## 2014-06-24 LAB — URINALYSIS, ROUTINE W REFLEX MICROSCOPIC
Bilirubin Urine: NEGATIVE
Glucose, UA: 500 mg/dL — AB
Ketones, ur: NEGATIVE mg/dL
Leukocytes, UA: NEGATIVE
Nitrite: NEGATIVE
Protein, ur: 100 mg/dL — AB
SPECIFIC GRAVITY, URINE: 1.013 (ref 1.005–1.030)
Urobilinogen, UA: 1 mg/dL (ref 0.0–1.0)
pH: 7 (ref 5.0–8.0)

## 2014-06-24 LAB — BASIC METABOLIC PANEL
Anion gap: 12 (ref 5–15)
BUN: 5 mg/dL — AB (ref 6–23)
CHLORIDE: 96 meq/L (ref 96–112)
CO2: 26 mEq/L (ref 19–32)
Calcium: 8.1 mg/dL — ABNORMAL LOW (ref 8.4–10.5)
Creatinine, Ser: 0.77 mg/dL (ref 0.50–1.35)
GFR calc non Af Amer: >90 mL/min (ref >90)
Glucose, Bld: 266 mg/dL — ABNORMAL HIGH (ref 70–99)
Potassium: 3.4 mEq/L — ABNORMAL LOW (ref 3.7–5.3)
Sodium: 134 mEq/L — ABNORMAL LOW (ref 137–147)

## 2014-06-24 LAB — URINE MICROSCOPIC-ADD ON

## 2014-06-24 LAB — CBC WITH DIFFERENTIAL/PLATELET
BASOS PCT: 0 % (ref 0–1)
Basophils Absolute: 0.1 10*3/uL (ref 0.0–0.1)
EOS PCT: 2 % (ref 0–5)
Eosinophils Absolute: 0.3 10*3/uL (ref 0.0–0.7)
HEMATOCRIT: 39 % (ref 39.0–52.0)
HEMOGLOBIN: 13.2 g/dL (ref 13.0–17.0)
Lymphocytes Relative: 9 % — ABNORMAL LOW (ref 12–46)
Lymphs Abs: 1.2 10*3/uL (ref 0.7–4.0)
MCH: 30 pg (ref 26.0–34.0)
MCHC: 33.8 g/dL (ref 30.0–36.0)
MCV: 88.6 fL (ref 78.0–100.0)
MONO ABS: 0.8 10*3/uL (ref 0.1–1.0)
MONOS PCT: 7 % (ref 3–12)
NEUTROS ABS: 10.1 10*3/uL — AB (ref 1.7–7.7)
Neutrophils Relative %: 82 % — ABNORMAL HIGH (ref 43–77)
Platelets: 217 10*3/uL (ref 150–400)
RBC: 4.4 MIL/uL (ref 4.22–5.81)
RDW: 13.6 % (ref 11.5–15.5)
WBC: 12.4 10*3/uL — ABNORMAL HIGH (ref 4.0–10.5)

## 2014-06-24 LAB — CBG MONITORING, ED: Glucose-Capillary: 228 mg/dL — ABNORMAL HIGH (ref 70–99)

## 2014-06-24 LAB — I-STAT CG4 LACTIC ACID, ED: Lactic Acid, Venous: 1.97 mmol/L (ref 0.5–2.2)

## 2014-06-24 MED ORDER — SODIUM CHLORIDE 0.9 % IV BOLUS (SEPSIS)
1000.0000 mL | Freq: Once | INTRAVENOUS | Status: AC
  Administered 2014-06-25: 1000 mL via INTRAVENOUS

## 2014-06-24 MED ORDER — HYDROCODONE-ACETAMINOPHEN 5-325 MG PO TABS
1.0000 | ORAL_TABLET | Freq: Once | ORAL | Status: AC
  Administered 2014-06-24: 1 via ORAL
  Filled 2014-06-24: qty 1

## 2014-06-24 NOTE — ED Notes (Signed)
Pt having multiple syncopal episodes. Pt put on cardiac monitoring and MD aware.

## 2014-06-24 NOTE — ED Notes (Signed)
Bed: [facility] Expected date:  Expected time:  Means of arrival:  Comments: EMS 68 yo male/foley placed/blood in urine/clots/pain

## 2014-06-24 NOTE — ED Provider Notes (Signed)
CSN: [id number]     Arrival date & time 06/24/14  2050 History   First MD Initiated Contact with Patient 06/24/14 2230     Chief Complaint  Patient presents with  . Hematuria     (Consider location/radiation/quality/duration/timing/severity/associated sxs/prior Treatment) HPI Patient reports home health nurse changed his Foley catheter today. Upon inserting catheter patient experienced extreme discomfort. He complains of pain at catheter insertion site since the event. And has noticed blood in Foley catheter tubing. He denies vomiting admits to subjective fever. No treatment prior to coming here.  Past Medical History  Diagnosis Date  . Obese   . Hypertension   . Diabetes mellitus   . Diabetic diarrhea   . Sleep apnea   . Diabetic neuropathy, painful   . Chronic indwelling Foley catheter   . Bladder spasms   . Complication of anesthesia   . PONV (postoperative nausea and vomiting)   . Hyperlipidemia   . GERD (gastroesophageal reflux disease)   . Neurogenic bladder   . Stroke 2013   history of present illness continued: Patient also complains of generalized weakness. He feels like he may have a urinary tract infection No other associated symptoms. Past Surgical History  Procedure Laterality Date  . Right tkr    . Gastroplasty    . Cholecystectomy    . Total knee arthroplasty     Family History  Problem Relation Age of Onset  . Hypertension Mother   . Diabetes type I Father   . Cancer Brother      Testicular Cancer  . Cancer Brother     Leukemia   History  Substance Use Topics  . Smoking status: Former Smoker -- 2.00 packs/day for 20 years    Quit date: 09/10/1976  . Smokeless tobacco: Never Used  . Alcohol Use: No     Comment: FORMER ALCOHOLIC WITH 23 YEAR SOBRIETY    Review of Systems  Genitourinary: Positive for dysuria and hematuria.  Musculoskeletal: Positive for gait problem.       Walks with walker or 4 pronged cane  Neurological: Positive for weakness.   All other systems reviewed and are negative.     Allergies  Ace inhibitors; Lipitor; Metformin and related; Cefadroxil; Cephalexin; Methocarbamol; Morphine and related; and Robaxin  Home Medications   Prior to Admission medications   Medication Sig Start Date End Date Taking? Authorizing Provider  ammonium lactate (AMLACTIN) 12 % cream Apply topically 2 (two) times daily. For xerosis    Historical Provider, MD  aspirin EC 81 MG tablet Take 81 mg by mouth every morning.     Historical Provider, MD  diphenhydrAMINE (BENADRYL) 25 MG tablet Take 25 mg by mouth every 6 (six) hours as needed for allergies.    Historical Provider, MD  furosemide (LASIX) 40 MG tablet Take 1 tablet (40 mg total) by mouth daily. 04/03/14   Irven Shelling, MD  glimepiride (AMARYL) 2 MG tablet Take 2 mg by mouth daily with breakfast.    Historical Provider, MD  HYDROcodone-acetaminophen (NORCO/VICODIN) 5-325 MG per tablet Take 1 tablet by mouth every 4 (four) hours as needed for severe pain. 12/25/13   Erlene Quan, PA-C  insulin aspart protamine- aspart (NOVOLOG MIX 70/30) (70-30) 100 UNIT/ML injection Inject 0.55 mLs (55 Units total) into the skin 2 (two) times daily with a meal. 03/24/14   Irven Shelling, MD  loperamide (IMODIUM A-D) 2 MG tablet Take 2 mg by mouth 4 (four) times daily as needed for diarrhea  or loose stools.    Historical Provider, MD  metoprolol tartrate (LOPRESSOR) 25 MG tablet Take 1 tablet (25 mg total) by mouth 2 (two) times daily. 04/03/14   Irven Shelling, MD  mupirocin ointment (BACTROBAN) 2 % Place 1 application into the nose 2 (two) times daily.    Historical Provider, MD  nitroGLYCERIN (NITROSTAT) 0.4 MG SL tablet Place 0.4 mg under the tongue every 5 (five) minutes as needed for chest pain.    Historical Provider, MD  omeprazole (PRILOSEC) 20 MG capsule Take 20 mg by mouth daily.    Historical Provider, MD  oxybutynin (DITROPAN) 5 MG tablet Take 5 mg by mouth 3 (three) times  daily as needed for bladder spasms.    Historical Provider, MD  potassium chloride SA (K-DUR,KLOR-CON) 20 MEQ tablet Take 1 tablet (20 mEq total) by mouth daily. 03/24/14   Irven Shelling, MD   BP 149/64  Pulse 72  Temp(Src) 98.7 F (37.1 C) (Oral)  Resp 18  SpO2 96% Physical Exam  Nursing note and vitals reviewed. Constitutional: He is oriented to person, place, and time. He appears well-developed and well-nourished.  HENT:  Head: Normocephalic and atraumatic.  Eyes: Conjunctivae are normal. Pupils are equal, round, and reactive to light.  Neck: Neck supple. No tracheal deviation present. No thyromegaly present.  Cardiovascular: Normal rate and regular rhythm.   No murmur heard. Pulmonary/Chest: Effort normal and breath sounds normal.  Abdominal: Soft. Bowel sounds are normal. He exhibits no distension. There is no tenderness.  Obese  Genitourinary: Penis normal.  Old catheter in place gross blood in catheter to the  Musculoskeletal: Normal range of motion. He exhibits no edema and no tenderness.  3+ pretibial pitting edema bilaterally  Neurological: He is alert and oriented to person, place, and time. Coordination normal.  He comes lightheaded on standing patient very unsteady on feet cannot walk without assistance  Skin: Skin is warm and dry. No rash noted.  Psychiatric: He has a normal mood and affect.    ED Course  Procedures (including critical care time) Labs Review Labs Reviewed  CBC WITH DIFFERENTIAL  BASIC METABOLIC PANEL  URINALYSIS, ROUTINE W REFLEX MICROSCOPIC    Imaging Review No results found.   EKG Interpretation   Date/Time:  Thursday June 24 2014 23:05:50 EDT Ventricular Rate:  97 PR Interval:  258 QRS Duration: 119 QT Interval:  397 QTC Calculation: 504 R Axis:   -41 Text Interpretation:  Sinus rhythm Ventricular bigeminy Prolonged PR  interval Incomplete left bundle branch block Inferior infarct, old  Baseline wander in lead(s) V2 No  significant change since last tracing  Confirmed by Nohely Whitehorn  MD, Tynisa Vohs [phone]) on 06/24/2014 11:38:48 PM     Nurse reported to me the patient became lightheaded and near syncopal event he was moved back into bed.  Results for orders placed during the hospital encounter of 06/24/14  CBC WITH DIFFERENTIAL      Result Value Ref Range   WBC 12.4 (*) 4.0 - 10.5 K/uL   RBC 4.40  4.22 - 5.81 MIL/uL   Hemoglobin 13.2  13.0 - 17.0 g/dL   HCT 39.0  39.0 - 52.0 %   MCV 88.6  78.0 - 100.0 fL   MCH 30.0  26.0 - 34.0 pg   MCHC 33.8  30.0 - 36.0 g/dL   RDW 13.6  11.5 - 15.5 %   Platelets 217  150 - 400 K/uL   Neutrophils Relative % 82 (*) 43 - 77 %  Neutro Abs 10.1 (*) 1.7 - 7.7 K/uL   Lymphocytes Relative 9 (*) 12 - 46 %   Lymphs Abs 1.2  0.7 - 4.0 K/uL   Monocytes Relative 7  3 - 12 %   Monocytes Absolute 0.8  0.1 - 1.0 K/uL   Eosinophils Relative 2  0 - 5 %   Eosinophils Absolute 0.3  0.0 - 0.7 K/uL   Basophils Relative 0  0 - 1 %   Basophils Absolute 0.1  0.0 - 0.1 K/uL  BASIC METABOLIC PANEL      Result Value Ref Range   Sodium 134 (*) 137 - 147 mEq/L   Potassium 3.4 (*) 3.7 - 5.3 mEq/L   Chloride 96  96 - 112 mEq/L   CO2 26  19 - 32 mEq/L   Glucose, Bld 266 (*) 70 - 99 mg/dL   BUN 5 (*) 6 - 23 mg/dL   Creatinine, Ser 0.77  0.50 - 1.35 mg/dL   Calcium 8.1 (*) 8.4 - 10.5 mg/dL   GFR calc non Af Amer >90  >90 mL/min   GFR calc Af Amer >90  >90 mL/min   Anion gap 12  5 - 15  URINALYSIS, ROUTINE W REFLEX MICROSCOPIC      Result Value Ref Range   Color, Urine RED (*) YELLOW   APPearance CLOUDY (*) CLEAR   Specific Gravity, Urine 1.013  1.005 - 1.030   pH 7.0  5.0 - 8.0   Glucose, UA 500 (*) NEGATIVE mg/dL   Hgb urine dipstick LARGE (*) NEGATIVE   Bilirubin Urine NEGATIVE  NEGATIVE   Ketones, ur NEGATIVE  NEGATIVE mg/dL   Protein, ur 100 (*) NEGATIVE mg/dL   Urobilinogen, UA 1.0  0.0 - 1.0 mg/dL   Nitrite NEGATIVE  NEGATIVE   Leukocytes, UA NEGATIVE  NEGATIVE  URINE  MICROSCOPIC-ADD ON      Result Value Ref Range   RBC / HPF TOO NUMEROUS TO COUNT  <3 RBC/hpf   Urine-Other URINALYSIS PERFORMED ON SUPERNATANT    I-STAT CG4 LACTIC ACID, ED      Result Value Ref Range   Lactic Acid, Venous 1.97  0.5 - 2.2 mmol/L  CBG MONITORING, ED      Result Value Ref Range   Glucose-Capillary 228 (*) 70 - 99 mg/dL   Comment 1 Notify RN     Comment 2 Documented in Chart     No results found.  MDM  Old records reviewed patient reportedly has had multiple hospital admissions for orthostatic syncope and has recently left skilled nursing facility for rehabilitation due to his inability to ambulate and care for himself Final diagnoses:  None   Dr. Ernestina Patches has been consult to do about her patient for inpatient stay. Patient does not feel well enough to go home at present. Hematuria is felt  To besecondary to traumatic Foley catheter placement Diagnoses #1 weakness #2 hematuria #3 hypokalemia #4 hyperglycemia     Orlie Dakin, MD 06/25/14 [phone]

## 2014-06-24 NOTE — ED Notes (Signed)
Bladder scan complete 160ml in bladder

## 2014-06-24 NOTE — ED Notes (Signed)
Per EMS pt comes from home. Pt states he had urinary catheter placed this morning with some difficulty getting pass the prostate. Pt reports blood in his urine as well as a clot passed after insertion of catheter. Pt is alert and oriented.

## 2014-06-25 ENCOUNTER — Inpatient Hospital Stay (HOSPITAL_COMMUNITY): Payer: Medicare Other

## 2014-06-25 DIAGNOSIS — Z9889 Other specified postprocedural states: Secondary | ICD-10-CM | POA: Diagnosis not present

## 2014-06-25 DIAGNOSIS — R55 Syncope and collapse: Secondary | ICD-10-CM | POA: Diagnosis not present

## 2014-06-25 DIAGNOSIS — I10 Essential (primary) hypertension: Secondary | ICD-10-CM | POA: Diagnosis present

## 2014-06-25 DIAGNOSIS — G4733 Obstructive sleep apnea (adult) (pediatric): Secondary | ICD-10-CM | POA: Diagnosis present

## 2014-06-25 DIAGNOSIS — R109 Unspecified abdominal pain: Secondary | ICD-10-CM

## 2014-06-25 DIAGNOSIS — Z8673 Personal history of transient ischemic attack (TIA), and cerebral infarction without residual deficits: Secondary | ICD-10-CM | POA: Diagnosis not present

## 2014-06-25 DIAGNOSIS — Z79891 Long term (current) use of opiate analgesic: Secondary | ICD-10-CM | POA: Diagnosis not present

## 2014-06-25 DIAGNOSIS — R200 Anesthesia of skin: Secondary | ICD-10-CM | POA: Diagnosis present

## 2014-06-25 DIAGNOSIS — R1084 Generalized abdominal pain: Secondary | ICD-10-CM | POA: Diagnosis present

## 2014-06-25 DIAGNOSIS — Z87891 Personal history of nicotine dependence: Secondary | ICD-10-CM | POA: Diagnosis not present

## 2014-06-25 DIAGNOSIS — K219 Gastro-esophageal reflux disease without esophagitis: Secondary | ICD-10-CM | POA: Diagnosis present

## 2014-06-25 DIAGNOSIS — I5032 Chronic diastolic (congestive) heart failure: Secondary | ICD-10-CM | POA: Diagnosis not present

## 2014-06-25 DIAGNOSIS — I728 Aneurysm of other specified arteries: Secondary | ICD-10-CM | POA: Diagnosis not present

## 2014-06-25 DIAGNOSIS — Z466 Encounter for fitting and adjustment of urinary device: Secondary | ICD-10-CM | POA: Diagnosis not present

## 2014-06-25 DIAGNOSIS — Z7982 Long term (current) use of aspirin: Secondary | ICD-10-CM | POA: Diagnosis not present

## 2014-06-25 DIAGNOSIS — Z794 Long term (current) use of insulin: Secondary | ICD-10-CM | POA: Diagnosis not present

## 2014-06-25 DIAGNOSIS — E1100 Type 2 diabetes mellitus with hyperosmolarity without nonketotic hyperglycemic-hyperosmolar coma (NKHHC): Secondary | ICD-10-CM | POA: Diagnosis not present

## 2014-06-25 DIAGNOSIS — E669 Obesity, unspecified: Secondary | ICD-10-CM | POA: Diagnosis not present

## 2014-06-25 DIAGNOSIS — R531 Weakness: Secondary | ICD-10-CM | POA: Diagnosis not present

## 2014-06-25 DIAGNOSIS — R339 Retention of urine, unspecified: Secondary | ICD-10-CM | POA: Diagnosis not present

## 2014-06-25 DIAGNOSIS — I6623 Occlusion and stenosis of bilateral posterior cerebral arteries: Secondary | ICD-10-CM | POA: Diagnosis not present

## 2014-06-25 DIAGNOSIS — M6281 Muscle weakness (generalized): Secondary | ICD-10-CM | POA: Diagnosis not present

## 2014-06-25 DIAGNOSIS — R319 Hematuria, unspecified: Secondary | ICD-10-CM | POA: Diagnosis not present

## 2014-06-25 DIAGNOSIS — E1140 Type 2 diabetes mellitus with diabetic neuropathy, unspecified: Secondary | ICD-10-CM | POA: Diagnosis not present

## 2014-06-25 DIAGNOSIS — I440 Atrioventricular block, first degree: Secondary | ICD-10-CM | POA: Diagnosis present

## 2014-06-25 DIAGNOSIS — R310 Gross hematuria: Secondary | ICD-10-CM | POA: Diagnosis not present

## 2014-06-25 DIAGNOSIS — D72829 Elevated white blood cell count, unspecified: Secondary | ICD-10-CM | POA: Diagnosis present

## 2014-06-25 DIAGNOSIS — Z79899 Other long term (current) drug therapy: Secondary | ICD-10-CM | POA: Diagnosis not present

## 2014-06-25 DIAGNOSIS — T83028A Displacement of other indwelling urethral catheter, initial encounter: Secondary | ICD-10-CM | POA: Diagnosis present

## 2014-06-25 DIAGNOSIS — I2510 Atherosclerotic heart disease of native coronary artery without angina pectoris: Secondary | ICD-10-CM | POA: Diagnosis present

## 2014-06-25 DIAGNOSIS — R600 Localized edema: Secondary | ICD-10-CM | POA: Diagnosis not present

## 2014-06-25 DIAGNOSIS — Z833 Family history of diabetes mellitus: Secondary | ICD-10-CM | POA: Diagnosis not present

## 2014-06-25 DIAGNOSIS — Z96651 Presence of right artificial knee joint: Secondary | ICD-10-CM | POA: Diagnosis present

## 2014-06-25 DIAGNOSIS — E1165 Type 2 diabetes mellitus with hyperglycemia: Secondary | ICD-10-CM | POA: Diagnosis present

## 2014-06-25 DIAGNOSIS — I509 Heart failure, unspecified: Secondary | ICD-10-CM | POA: Diagnosis not present

## 2014-06-25 DIAGNOSIS — I359 Nonrheumatic aortic valve disorder, unspecified: Secondary | ICD-10-CM | POA: Diagnosis not present

## 2014-06-25 DIAGNOSIS — I259 Chronic ischemic heart disease, unspecified: Secondary | ICD-10-CM | POA: Diagnosis not present

## 2014-06-25 DIAGNOSIS — E876 Hypokalemia: Secondary | ICD-10-CM | POA: Diagnosis present

## 2014-06-25 DIAGNOSIS — E785 Hyperlipidemia, unspecified: Secondary | ICD-10-CM | POA: Diagnosis present

## 2014-06-25 DIAGNOSIS — Z8249 Family history of ischemic heart disease and other diseases of the circulatory system: Secondary | ICD-10-CM | POA: Diagnosis not present

## 2014-06-25 DIAGNOSIS — Z6841 Body Mass Index (BMI) 40.0 and over, adult: Secondary | ICD-10-CM | POA: Diagnosis not present

## 2014-06-25 DIAGNOSIS — E118 Type 2 diabetes mellitus with unspecified complications: Secondary | ICD-10-CM | POA: Diagnosis not present

## 2014-06-25 DIAGNOSIS — N319 Neuromuscular dysfunction of bladder, unspecified: Secondary | ICD-10-CM | POA: Diagnosis not present

## 2014-06-25 LAB — GLUCOSE, CAPILLARY
GLUCOSE-CAPILLARY: 266 mg/dL — AB (ref 70–99)
Glucose-Capillary: 253 mg/dL — ABNORMAL HIGH (ref 70–99)
Glucose-Capillary: 244 mg/dL — ABNORMAL HIGH (ref 70–99)

## 2014-06-25 LAB — MAGNESIUM: Magnesium: 1.7 mg/dL (ref 1.5–2.5)

## 2014-06-25 LAB — TROPONIN I

## 2014-06-25 LAB — URINE CULTURE
COLONY COUNT: NO GROWTH
Culture: NO GROWTH

## 2014-06-25 LAB — HEMOGLOBIN A1C
Hgb A1c MFr Bld: 8.9 % — ABNORMAL HIGH (ref <5.7)
MEAN PLASMA GLUCOSE: 209 mg/dL — AB (ref <117)

## 2014-06-25 LAB — CBG MONITORING, ED: Glucose-Capillary: 272 mg/dL — ABNORMAL HIGH (ref 70–99)

## 2014-06-25 LAB — TSH: TSH: 1.62 u[IU]/mL (ref 0.350–4.500)

## 2014-06-25 LAB — VITAMIN B12: VITAMIN B 12: 359 pg/mL (ref 211–911)

## 2014-06-25 LAB — MRSA PCR SCREENING: MRSA by PCR: POSITIVE — AB

## 2014-06-25 MED ORDER — POTASSIUM CHLORIDE 10 MEQ/100ML IV SOLN
10.0000 meq | INTRAVENOUS | Status: AC
  Administered 2014-06-25 (×2): 10 meq via INTRAVENOUS
  Filled 2014-06-25 (×2): qty 100

## 2014-06-25 MED ORDER — STROKE: EARLY STAGES OF RECOVERY BOOK
Freq: Once | Status: DC
  Filled 2014-06-25: qty 1

## 2014-06-25 MED ORDER — SENNOSIDES-DOCUSATE SODIUM 8.6-50 MG PO TABS: 1.0000 | ORAL_TABLET | Freq: Every evening | ORAL | Status: DC | PRN

## 2014-06-25 MED ORDER — NITROGLYCERIN 0.4 MG SL SUBL: 0.4000 mg | SUBLINGUAL_TABLET | SUBLINGUAL | Status: DC | PRN

## 2014-06-25 MED ORDER — POTASSIUM CHLORIDE CRYS ER 20 MEQ PO TBCR
40.0000 meq | EXTENDED_RELEASE_TABLET | Freq: Once | ORAL | Status: DC
  Filled 2014-06-25: qty 2

## 2014-06-25 MED ORDER — ASPIRIN 300 MG RE SUPP
300.0000 mg | Freq: Every day | RECTAL | Status: DC
  Filled 2014-06-25 (×3): qty 1

## 2014-06-25 MED ORDER — OXYBUTYNIN CHLORIDE 5 MG PO TABS: 5.0000 mg | ORAL_TABLET | Freq: Three times a day (TID) | ORAL | Status: DC | PRN

## 2014-06-25 MED ORDER — PANTOPRAZOLE SODIUM 40 MG PO TBEC
40.0000 mg | DELAYED_RELEASE_TABLET | Freq: Every day | ORAL | Status: DC
  Administered 2014-06-25 – 2014-06-29 (×5): 40 mg via ORAL
  Filled 2014-06-25 (×5): qty 1

## 2014-06-25 MED ORDER — ENOXAPARIN SODIUM 30 MG/0.3ML ~~LOC~~ SOLN
30.0000 mg | SUBCUTANEOUS | Status: DC
  Administered 2014-06-25: 30 mg via SUBCUTANEOUS
  Filled 2014-06-25: qty 0.3

## 2014-06-25 MED ORDER — FUROSEMIDE 40 MG PO TABS
40.0000 mg | ORAL_TABLET | Freq: Every day | ORAL | Status: DC
  Administered 2014-06-25 – 2014-06-29 (×5): 40 mg via ORAL
  Filled 2014-06-25 (×6): qty 1

## 2014-06-25 MED ORDER — HYDROCODONE-ACETAMINOPHEN 5-325 MG PO TABS
1.0000 | ORAL_TABLET | ORAL | Status: DC | PRN
  Administered 2014-06-25 – 2014-06-28 (×5): 1 via ORAL
  Filled 2014-06-25 (×5): qty 1

## 2014-06-25 MED ORDER — MUPIROCIN 2 % EX OINT
1.0000 "application " | TOPICAL_OINTMENT | Freq: Two times a day (BID) | CUTANEOUS | Status: DC
  Administered 2014-06-25 – 2014-06-29 (×8): 1 via NASAL
  Filled 2014-06-25: qty 22

## 2014-06-25 MED ORDER — INSULIN ASPART 100 UNIT/ML ~~LOC~~ SOLN
0.0000 [IU] | SUBCUTANEOUS | Status: DC
  Administered 2014-06-25: 5 [IU] via SUBCUTANEOUS
  Administered 2014-06-25: 3 [IU] via SUBCUTANEOUS
  Administered 2014-06-26: 5 [IU] via SUBCUTANEOUS
  Administered 2014-06-26: 3 [IU] via SUBCUTANEOUS
  Administered 2014-06-26: 2 [IU] via SUBCUTANEOUS
  Administered 2014-06-27 – 2014-06-28 (×2): 3 [IU] via SUBCUTANEOUS
  Administered 2014-06-28: 5 [IU] via SUBCUTANEOUS
  Administered 2014-06-28: 3 [IU] via SUBCUTANEOUS
  Administered 2014-06-28: 2 [IU] via SUBCUTANEOUS
  Filled 2014-06-25: qty 2

## 2014-06-25 MED ORDER — FLUTICASONE PROPIONATE 50 MCG/ACT NA SUSP
1.0000 | Freq: Every day | NASAL | Status: DC
  Filled 2014-06-25: qty 16

## 2014-06-25 MED ORDER — METOPROLOL TARTRATE 25 MG PO TABS
25.0000 mg | ORAL_TABLET | Freq: Two times a day (BID) | ORAL | Status: DC
  Administered 2014-06-25 – 2014-06-29 (×10): 25 mg via ORAL
  Filled 2014-06-25 (×5): qty 1
  Filled 2014-06-25: qty 2
  Filled 2014-06-25 (×10): qty 1

## 2014-06-25 MED ORDER — ASPIRIN 325 MG PO TABS
325.0000 mg | ORAL_TABLET | Freq: Every day | ORAL | Status: DC
  Administered 2014-06-25 – 2014-06-29 (×6): 325 mg via ORAL
  Filled 2014-06-25 (×7): qty 1

## 2014-06-25 MED ORDER — CIPROFLOXACIN IN D5W 400 MG/200ML IV SOLN
400.0000 mg | Freq: Two times a day (BID) | INTRAVENOUS | Status: DC
Start: 2014-06-25 — End: 2014-06-27
  Administered 2014-06-25 – 2014-06-26 (×3): 400 mg via INTRAVENOUS
  Filled 2014-06-25 (×5): qty 200

## 2014-06-25 MED ORDER — CHLORHEXIDINE GLUCONATE CLOTH 2 % EX PADS
6.0000 | MEDICATED_PAD | Freq: Every day | CUTANEOUS | Status: DC
  Administered 2014-06-26 – 2014-06-29 (×4): 6 via TOPICAL

## 2014-06-25 MED ORDER — INSULIN ASPART PROT & ASPART (70-30 MIX) 100 UNIT/ML ~~LOC~~ SUSP
55.0000 [IU] | Freq: Two times a day (BID) | SUBCUTANEOUS | Status: DC
  Administered 2014-06-25: 55 [IU] via SUBCUTANEOUS
  Administered 2014-06-26: 10 [IU] via SUBCUTANEOUS
  Filled 2014-06-25: qty 10

## 2014-06-25 NOTE — ED Notes (Signed)
Pt refusing blood draw.

## 2014-06-25 NOTE — Progress Notes (Signed)
Nutrition Brief Note  Patient identified on the Malnutrition Screening Tool (MST) Report  Wt Readings from Last 15 Encounters:  06/25/14 358 lb 4.8 oz (162.524 kg)  04/03/14 352 lb 1.2 oz (159.7 kg)  03/23/14 359 lb 5.6 oz (163 kg)  01/20/14 369 lb (167.377 kg)  01/19/14 369 lb (167.377 kg)  12/25/13 371 lb 12.8 oz (168.647 kg)  10/24/12 378 lb 1.6 oz (171.505 kg)  03/30/12 372 lb 5.7 oz (168.9 kg)  03/11/12 378 lb 5 oz (171.6 kg)  11/17/11 392 lb 6.7 oz (178 kg)  11/12/11 384 lb (174.181 kg)  10/24/11 382 lb 3.2 oz (173.365 kg)  10/24/11 382 lb 3.2 oz (173.365 kg)  10/24/11 382 lb 3.2 oz (173.365 kg)  10/24/11 382 lb 3.2 oz (173.365 kg)    Body mass index is 48.58 kg/(m^2). Patient meets criteria for morbid obesity based on current BMI.   Current diet order is heart health/carbohydrate modified. Pt reports that he had a poor appetite prior to admission, but says that it has since improved. He currently feels hungry Labs and medications reviewed.   No nutrition interventions warranted at this time. If nutrition issues arise, please consult RD.   Laurette Schimke RD, LDN

## 2014-06-25 NOTE — ED Notes (Signed)
Pt c/o pain in bladder. CT was performed and findings were foley was in pt meatus. MD was called as well as charge nurse notified. Balloon was deflated (with a total of 20 cc of water being pulled out into syringe) and readjusted in which pt felt some relief. Once catheter was properly re positioned by staff pt began to bleed profusely from penis. MD made aware and is consulting urology. Pt foley catheter placed prior to ED arrival by pt home health nurse.

## 2014-06-25 NOTE — Progress Notes (Signed)
Subjective: Feels his right arm has improved but still feels it is slightly weaker than left.   Objective: Current vital signs: BP 118/49  Pulse 84  Temp(Src) 99.9 F (37.7 C) (Oral)  Resp 20  Ht 6' (1.829 m)  Wt 162.524 kg (358 lb 4.8 oz)  BMI 48.58 kg/m2  SpO2 95% Vital signs in last 24 hours: Temp:  [98.2 F (36.8 C)-100.7 F (38.2 C)] 99.9 F (37.7 C) (10/16 0830) Pulse Rate:  [72-99] 84 (10/16 0830) Resp:  [18-28] 20 (10/16 0830) BP: (118-170)/(49-75) 118/49 mmHg (10/16 0830) SpO2:  [93 %-98 %] 95 % (10/16 0830) Weight:  [162.524 kg (358 lb 4.8 oz)] 162.524 kg (358 lb 4.8 oz) (10/16 0533)  Intake/Output from previous day: 10/15 0701 - 10/16 0700 In: -  Out: 1400 [Urine:1400] Intake/Output this shift:   Nutritional status:    Neurologic Exam: General: depressed affect Mental Status: Alert, oriented, thought content appropriate.  Speech fluent without evidence of aphasia.  Able to follow 3 step commands without difficulty. Cranial Nerves: II:  Visual fields grossly normal, pupils equal, round, reactive to light and accommodation III,IV, VI: ptosis not present, extra-ocular motions intact bilaterally V,VII: smile symmetric, facial light touch sensation normal bilaterally VIII: hearing normal bilaterally IX,X: gag reflex present XI: bilateral shoulder shrug XII: midline tongue extension without atrophy or fasciculations  Motor: Right : Upper extremity   5/5    Left:     Upper extremity   5/5  Lower extremity   5/5     Lower extremity   5/5 --exam shows multiple functional components.  Tone and bulk:normal tone throughout; no atrophy noted Sensory: Pinprick and light touch intact throughout with decreased sensation in stocking distribution Deep Tendon Reflexes:  1+ throughout with no AJ  Plantars: Right: downgoing   Left: downgoing Cerebellar: normal finger-to-nose,  normal heel-to-shin test    Lab Results: Basic Metabolic Panel:  Recent Labs Lab  06/24/14 2245 06/25/14 0130  NA 134*  --   K 3.4*  --   CL 96  --   CO2 26  --   GLUCOSE 266*  --   BUN 5*  --   CREATININE 0.77  --   CALCIUM 8.1*  --   MG  --  1.7    Liver Function Tests: No results found for this basename: AST, ALT, ALKPHOS, BILITOT, PROT, ALBUMIN,  in the last 168 hours No results found for this basename: LIPASE, AMYLASE,  in the last 168 hours No results found for this basename: AMMONIA,  in the last 168 hours  CBC:  Recent Labs Lab 06/24/14 2245  WBC 12.4*  NEUTROABS 10.1*  HGB 13.2  HCT 39.0  MCV 88.6  PLT 217    Cardiac Enzymes:  Recent Labs Lab 06/25/14 0130  TROPONINI <0.30    Lipid Panel: No results found for this basename: CHOL, TRIG, HDL, CHOLHDL, VLDL, LDLCALC,  in the last 168 hours  CBG:  Recent Labs Lab 06/24/14 2259 06/25/14 0407 06/25/14 0758  GLUCAP 228* 272* 266*    Microbiology: Results for orders placed during the hospital encounter of 06/24/14  MRSA PCR SCREENING     Status: Abnormal   Collection Time    06/25/14  5:34 AM      Result Value Ref Range Status   MRSA by PCR POSITIVE (*) NEGATIVE Final   Comment:            The GeneXpert MRSA Assay (FDA     approved for NASAL  specimens     only), is one component of a     comprehensive MRSA colonization     surveillance program. It is not     intended to diagnose MRSA     infection nor to guide or     monitor treatment for     MRSA infections.     CRITICAL RESULT CALLED TO, READ BACK BY AND VERIFIED WITH:     Bernita Raisin RN 06/25/14 [id number] COSTELLO B    Coagulation Studies: No results found for this basename: LABPROT, INR,  in the last 72 hours  Imaging: Ct Abdomen Pelvis Wo Contrast  06/25/2014   CLINICAL DATA:  Abdominal pain. Gross hematuria after urinary catheter change at home 06/24/2014.  EXAM: CT ABDOMEN AND PELVIS WITHOUT CONTRAST  TECHNIQUE: Multidetector CT imaging of the abdomen and pelvis was performed following the standard protocol without IV  contrast.  COMPARISON:  None.  FINDINGS: BODY WALL: Unremarkable.  LOWER CHEST: Coronary atherosclerosis. Aortic valve calcifications/sclerosis.  ABDOMEN/PELVIS:  Liver: No focal abnormality.  Biliary: Cholecystectomy.  Pancreas: Unremarkable.  Spleen: Unremarkable.  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  Kidneys and ureters: No hydronephrosis or stone.  Bladder: Gas in the urinary bladder is likely from instrumentation. There is a Foley catheter which is partially visualized, with balloon inflated in the mid anterior urethra.  Reproductive: The prostate gland is either small or surgically absent.  Bowel: Status post gastric bypass. No bowel obstruction or evidence of inflammation. Normal appendix.  Retroperitoneum: No mass or adenopathy.  Peritoneum: No ascites or pneumoperitoneum.  Vascular: No acute abnormality.  OSSEOUS: Bilateral hip osteoarthritis, with joint narrowing advanced on the right. Advanced degenerative disc disease at L4-5, with moderate anterior slip from facet osteoarthritis. There is notable spinal canal stenosis at this level. Vertebral body wedging at the thoracolumbar junction without acute fracture.  Results were called by telephone at the time of interpretation on 06/25/2014 at 3:50 am to Dr. Shanda Howells , who was already aware of these results.  IMPRESSION: 1. Malpositioned Foley catheter with balloon inflated in the anterior urethra. 2. Incidental findings are described above.   Electronically Signed   By: Jorje Guild M.D.   On: 06/25/2014 03:51   Dg Chest 2 View  06/25/2014   CLINICAL DATA:  Stroke.  Initial encounter  EXAM: CHEST  2 VIEW  COMPARISON:  04/01/2014  FINDINGS: Cardiomegaly which is accentuated by a low lung volumes. Tortuous thoracic aorta which has also been noted previously. There is diffuse interstitial opacity with cephalized blood flow. No evidence for effusion or pneumothorax. Surgical clips in the gastric region.  IMPRESSION: CHF pattern.    Electronically Signed   By: Jorje Guild M.D.   On: 06/25/2014 01:49   Ct Head Wo Contrast  06/25/2014   CLINICAL DATA:  Stroke.  Syncope.  Initial encounter  EXAM: CT HEAD WITHOUT CONTRAST  TECHNIQUE: Contiguous axial images were obtained from the base of the skull through the vertex without intravenous contrast.  COMPARISON:  12/23/2013  FINDINGS: Skull and Sinuses:Negative for fracture or destructive process. The mastoids, middle ears, and imaged paranasal sinuses are clear.  Orbits: No acute abnormality.  Brain: No evidence of acute abnormality, such as acute infarction, hemorrhage, hydrocephalus, or mass lesion/mass effect. Globus pallidus and thalamic calcifications which are symmetric and likely related to calcium dysregulation.  IMPRESSION: No acute intracranial findings.   Electronically Signed   By: Jorje Guild M.D.   On: 06/25/2014 03:31   Mr  Attempted Study-no Report  06/25/2014   This examination belongs to an outside facility and is stored  here for comparison purposes only.  Contact the originating outside  institution for any associated report or interpretation.   Medications:  Scheduled: .  stroke: mapping our early stages of recovery book   Does not apply Once  . aspirin  300 mg Rectal Daily   Or  . aspirin  325 mg Oral Daily  . enoxaparin (LOVENOX) injection  30 mg Subcutaneous Q24H  . furosemide  40 mg Oral Daily  . insulin aspart  0-9 Units Subcutaneous 6 times per day  . insulin aspart protamine- aspart  55 Units Subcutaneous BID WC  . metoprolol tartrate  25 mg Oral BID  . pantoprazole  40 mg Oral Daily    Assessment/Plan:  68 YO male with right sided weakness. No weakness found on exam other than bilateral hip flexion 4/5.  Strength appears to be effort dependent.  Patient shows a very depressed affect.   Will follow: 1)Check MRI/A brain  2)Check Carotid doppler and 2D echo in light of frequent syncopal episodes  3)Increase ASA to 325mg  daily  4)Check  B12, B1, TSH    Etta Quill PA-C [facility] [phone]  06/25/2014, 10:25 AM

## 2014-06-25 NOTE — ED Notes (Signed)
Scant oozing blood from penis-pt reports feeling better at this time/patient also requesting a supra-pubic catheter to be placed

## 2014-06-25 NOTE — Evaluation (Signed)
Notified by radiology about Pt with noted inflated foley in post urethra on imaging Foley replaced by nursing at bedside.  Pt now with gross hematuria leaking around foley  Noted to be s/p full dose ASA for presumed CVA as well as chest pain  Discussed with on call urology-Grapey  They are coming to consult on pt in ER.

## 2014-06-25 NOTE — ED Notes (Signed)
Staff was unable to perform MRI due to pt size. MD aware.

## 2014-06-25 NOTE — Progress Notes (Signed)
[facility] progress note. Chief complaint. Transfer note. History of present illness. This 68 year old male presented to [facility] long emergency room with hematuria and syncope rule out CVA. I concern for neurocardiac source per admitting physician. CVA workup including MRI/MRA, echocardiogram, wrist edification. Patient unsure of onset greater than 24 hours. Complained of chest pain with questionable need bigeminy her EKG. Patient will need a cardiology consult. Patient noted with hematuria on Foley cath placement. Will need urology consult. Complaining of abdominal pain and needs he T. abdomen and pelvis as well as lipase. Also has chronic diabetes and CHF. The patient was felt to require transfer to [facility] for further neurologic workup and has arrived in transfer. I'm seeing the patient at bedside to ensure he remained clinically stable and that his orders transferred appropriately. Vital signs. Temperature 100.7, pulse 97, respiration 22, blood pressure 141/56. O2 sats 95%. General appearance. Obese male who is alert and in no distress. Cardiac. Rate and rhythm regular. 2/6 systolic ejection murmur. Lungs. Breath sounds are reduced but clear. Abdomen. Soft with hypotonic bowel sounds. Diffuse pain with palpation. Impression/plan. Problem #1. Syncope rule out CVA. 4 stroke workup including MRI/MRA, echocardiogram,  risk stratification. will notify neurology if patient's arrival. Problem #2. Chest pain. On full dose aspirin. Patient will be for cardiology consult this morning. Problem #3. Hematuria with neurogenic bladder. Foley catheter still indicates pink tinged urine and some blood clots. Urology consult as clinically indicated. Problem #4. Abdominal pain. 4 CT scan, lipase. Problem #5. Diabetes. Continue 70/30 insulin and sliding scale coverage as needed. Patient appears clinically stable post transfer. All orders appear transferred appropriately.

## 2014-06-25 NOTE — Care Management Note (Unsigned)
    Page 1 of 1   06/28/2014     4:02:44 PM CARE MANAGEMENT NOTE 06/28/2014  Patient:  KIPLING, LUMBERT   Account Number:  [id number]  Date Initiated:  06/25/2014  Documentation initiated by:  GRAVES-BIGELOW,Amberlin Utke  Subjective/Objective Assessment:   Pt admitted for Gross hematuria, blood per urethra, malpositioned Foley catheter. Per pt he is from home alone. CM did call to [facility] and pt is active with RN services. Pt had refused HHPT/ SW services.     Action/Plan:   MD please write resumption orders with a f74f once pt is medically stable for d/c. Pt willbenefit more from SNF, however he may refuse SNF at this time.   Anticipated DC Date:  06/27/2014   Anticipated DC Plan:  [place]  CM consult      [facility] Choice  HOME HEALTH  Resumption Of Svcs/PTA Provider   Choice offered to / List presented to:  C-1 Patient           Status of service:   Medicare Important Message given?  YES (If response is "NO", the following Medicare IM given date fields will be blank) Date Medicare IM given:  06/25/2014 Medicare IM given by:  GRAVES-BIGELOW,Jhair [place] Date Additional Medicare IM given:  06/28/2014 Additional Medicare IM given by:  Jeanie Mccard GRAVES-BIGELOW  Discharge Disposition:    Per UR Regulation:  Reviewed for med. necessity/level of care/duration of stay  If discussed at [place] of Stay Meetings, dates discussed:   06/29/2014    Comments:

## 2014-06-25 NOTE — H&P (Addendum)
Hospitalist Admission History and Physical  Patient name: [name] Medical record number: [id number] Date of birth: 04/07/1946 Age: 68 y.o. Gender: male  Primary Care Provider: Irven Shelling, MD  Chief Complaint: hematuria, syncope, ? CVA   History of Present Illness:This is a 68 y.o. year old male with significant past medical history of multiple medical problems including morbid obesity, limited mobility, chronic indwelling foley 2/2 neurogenic bladder, chronic diastolic heart failure, CAD, IDDM presenting with hematuria, syncope, ? CVA. Pt has an indwelling foley that gets changed monthly. Pt had foley changed by [facility] nursing. States that foley change was traumatic. Had significant hematuria s?p change. EMS was subsequently called. Per report, pt's housing was extremely dilapidated to the point EMS could barely get in his house. On arrival to ER, pt afebrile and hemodynamically stable. Had bloodwork that was WNL apart from WBC 12.4 and K 3.4. UA negative for infection but did show gross hematuria. EKG Sinus rhythm, Ventricular bigeminy,Prolonged PR interval- relatively unchanged from most recent apart from bigeminy Initial plan was for pt to go home. However, upon ambulation, pt had 4-5 episodes of syncope and confusion per nursing. Pt does not have any recollection of incident. In further discussion with pt, he states that he has had some worsening R sided weakness and numbness that is fairly new from baseline (unsure of duration). Has also had some mild central chest pressure over past 24 hours. No active CP currently. Denies any confusion or dyspnea.   Assessment and Plan: [name] is a 68 y.o. year old male presenting with hematuria, syncope, ? CVA, chest pain   Active Problems:   Syncope   Hematuria   1- Syncope/? CVA -high concern for neurocardiogenic source of sxs in setting of multiple RFs including prior CVA -start CVA workup-head CT, MRI/MRA, 2D ECHO, risk  stratification labs  -case discussed with on call Neuro Janann Colonel)- full dose ASA-outside tPA as pt unsure of onset but > 24 hrs  -tele bed  -neuro consult on arrival to [facility]  2- Chest Pain  -somewhat typical sxs -prior episodes in past followed by Cards Marlou Porch) -had nuc med study 01/2014-unsure of results -no active CP currently  -cycle CEs -full dose ASA  -prn NTG-may need to avoid as to prevent orthostasis/syncope-follow closely  -? New bigeminy on EKG  -cards consult   3- Hematuria/neurogenic bladder  -traumatic in etiology -clearing in view of urinary stream from foley -follow  -consult urology as clinically indicated   4-Abd Pain  -mild-moderate generalized abd pain on exam -CT abd and pelvis -check lipase  -noted mild leukocytosis w/o any overt signs of infection -broad spectrum abx w/ GU coverage if pt spikes fever -f/u imaging including CXR   5-IDDM -70/30  -SSI  -A1C   6-Chronic diastolic heart failure -morbidly obese, though appears to be mildly dry  -gentle hydration  -pro BNP -2D ECHO  -f/u CXR  FEN/GI: NPO pending bedside swallow eval  Prophylaxis: lovenox  Disposition: pending further evaluation Code Status: Full Code    Patient Active Problem List   Diagnosis Date Noted  . Syncope 04/01/2014  . Chronic diastolic CHF (congestive heart failure) 04/01/2014  . CHF (congestive heart failure) 03/21/2014  . Acute on chronic diastolic CHF (congestive heart failure) 03/21/2014  . Obesity- BMI 49 12/25/2013  . Physical deconditioning 12/25/2013  . Mental status change 12/23/2013  . Syncope and collapse 12/23/2013  . Chest pain with moderate risk of acute coronary syndrome 12/23/2013  . Chronic indwelling  Foley catheter 12/23/2013  . Gait abnormality 03/30/2012  . Vision disturbance 03/28/2012  . Paresthesia 03/12/2012  . CVA (cerebral infarction) 03/11/2012  . Abdominal pain, right lower quadrant 03/11/2012  . Hypokalemia 03/11/2012  . Obstipation  03/11/2012  . Acute gastroenteritis 11/16/2011  . Diarrhea 11/15/2011  . CAD- known RCA disease- medical Rx 11/15/2011  . SOB (shortness of breath) 11/12/2011  . Hyponatremia 11/12/2011  . NSVT (nonsustained ventricular tachycardia) 10/26/2011  . Chest pain 10/19/2011  . Dizziness 10/19/2011  . DM (diabetes mellitus) with complications [id number]  . 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 OSA (obstructive sleep apnea) 10/10/2011  . Diabetic neuropathy, painful 10/10/2011  . Painful bladder spasm 10/10/2011  . Dehydration 10/10/2011  . UTI (lower urinary tract infection) 10/10/2011  . Hyperglycemia without ketosis 10/10/2011  . Generalized headaches 10/10/2011  . Hypertension 10/10/2011   Past Medical History: Past Medical History  Diagnosis Date  . Obese   . Hypertension   . Diabetes mellitus   . Diabetic diarrhea   . Sleep apnea   . Diabetic neuropathy, painful   . Chronic indwelling Foley catheter   . Bladder spasms   . Complication of anesthesia   . PONV (postoperative nausea and vomiting)   . Hyperlipidemia   . GERD (gastroesophageal reflux disease)   . Neurogenic bladder   . Stroke 2013    Past Surgical History: Past Surgical History  Procedure Laterality Date  . Right tkr    . Gastroplasty    . Cholecystectomy    . Total knee arthroplasty      Social History: History   Social History  . Marital Status: Single    Spouse Name: N/A    Number of Children: N/A  . Years of Education: N/A   Social History Main Topics  . Smoking status: Former Smoker -- 2.00 packs/day for 20 years    Quit date: 09/10/1976  . Smokeless tobacco: Never Used  . Alcohol Use: No     Comment: FORMER ALCOHOLIC WITH 23 YEAR SOBRIETY  . Drug Use: No  . Sexual Activity: No   Other Topics Concern  . None   Social History Narrative  . None    Family History: Family History  Problem Relation Age of Onset  . Hypertension Mother   . Diabetes type I Father   . Cancer  Brother      Testicular Cancer  . Cancer Brother     Leukemia    Allergies: Allergies  Allergen Reactions  . Ace Inhibitors Swelling  . Lipitor [Atorvastatin Calcium] Swelling  . Metformin And Related Swelling  . Cefadroxil Hives  . Cephalexin Hives  . Methocarbamol Other (See Comments)    Feels like is in "rocky boat"  . Morphine And Related Other (See Comments)    Sweating, feels like is "in rocky boat."  . Robaxin [Methocarbamol] Other (See Comments)    Feels like he is shaky    Current Facility-Administered Medications  Medication Dose Route Frequency Provider Last Rate Last Dose  .  stroke: mapping our early stages of recovery book   Does not apply Once Shanda Howells, MD      . aspirin suppository 300 mg  300 mg Rectal Daily Shanda Howells, MD       Or  . aspirin tablet 325 mg  325 mg Oral Daily Shanda Howells, MD      . enoxaparin (LOVENOX) injection 30 mg  30 mg Subcutaneous Q24H Shanda Howells, MD      .  furosemide (LASIX) tablet 40 mg  40 mg Oral Daily Shanda Howells, MD      . insulin aspart protamine- aspart (NOVOLOG MIX 70/30) injection 55 Units  55 Units Subcutaneous BID WC Shanda Howells, MD      . metoprolol tartrate (LOPRESSOR) tablet 25 mg  25 mg Oral BID Shanda Howells, MD      . nitroGLYCERIN (NITROSTAT) SL tablet 0.4 mg  0.4 mg Sublingual Q5 min PRN Shanda Howells, MD      . oxybutynin (DITROPAN) tablet 5 mg  5 mg Oral TID PRN Shanda Howells, MD      . pantoprazole (PROTONIX) EC tablet 40 mg  40 mg Oral Daily Shanda Howells, MD      . senna-docusate (Senokot-S) tablet 1 tablet  1 tablet Oral QHS PRN Shanda Howells, MD       Current Outpatient Prescriptions  Medication Sig Dispense Refill  . ammonium lactate (AMLACTIN) 12 % cream Apply 1 g topically daily as needed for dry skin.       Marland Kitchen aspirin EC 81 MG tablet Take 81 mg by mouth every morning.       . diphenoxylate-atropine (LOMOTIL) 2.5-0.025 MG per tablet Take 1 tablet by mouth 3 (three) times daily as needed. For  diarrhea      . fexofenadine (ALLEGRA) 180 MG tablet Take 180 mg by mouth daily.      . furosemide (LASIX) 40 MG tablet Take 1 tablet (40 mg total) by mouth daily.  30 tablet  11  . HYDROcodone-acetaminophen (NORCO/VICODIN) 5-325 MG per tablet Take 1 tablet by mouth every 4 (four) hours as needed for severe pain.  15 tablet  0  . insulin aspart protamine- aspart (NOVOLOG MIX 70/30) (70-30) 100 UNIT/ML injection Inject 0.55 mLs (55 Units total) into the skin 2 (two) times daily with a meal.  10 mL  11  . loperamide (IMODIUM A-D) 2 MG tablet Take 2 mg by mouth 4 (four) times daily as needed for diarrhea or loose stools.      . metoprolol tartrate (LOPRESSOR) 25 MG tablet Take 1 tablet (25 mg total) by mouth 2 (two) times daily.  60 tablet  11  . nitroGLYCERIN (NITROSTAT) 0.4 MG SL tablet Place 0.4 mg under the tongue every 5 (five) minutes as needed for chest pain.      Marland Kitchen omeprazole (PRILOSEC) 20 MG capsule Take 20 mg by mouth daily.      Marland Kitchen oxybutynin (DITROPAN) 5 MG tablet Take 5 mg by mouth 3 (three) times daily as needed for bladder spasms.       Review Of Systems: 12 point ROS negative except as noted above in HPI.  Physical Exam: Filed Vitals:   06/25/14 0110  BP: 162/62  Pulse: 93  Temp:   Resp: 18    General: cooperative and morbidly obese HEENT: PERRLA, extra ocular movement intact and dry oral mucosa Heart: S1, S2 normal, no murmur, rub or gallop, regular rate and rhythm Lungs: clear to auscultation, no wheezes or rales and unlabored breathing Abdomen:obese abd, + generalized abd TTP  Extremities: 2+ peripheral pulses, ? 1-2+ edema bilaterally  Skin:no rashes, no ecchymoses Neurology: loss of sensation in distal extremities bilaterlly, mild weakness on R>L  Labs and Imaging: Lab Results  Component Value Date/Time   NA 134* 06/24/2014 10:45 PM   K 3.4* 06/24/2014 10:45 PM   CL 96 06/24/2014 10:45 PM   CO2 26 06/24/2014 10:45 PM   BUN 5* 06/24/2014 10:45 PM  CREATININE  0.77 06/24/2014 10:45 PM   GLUCOSE 266* 06/24/2014 10:45 PM   Lab Results  Component Value Date   WBC 12.4* 06/24/2014   HGB 13.2 06/24/2014   HCT 39.0 06/24/2014   MCV 88.6 06/24/2014   PLT 217 06/24/2014   Urinalysis    Component Value Date/Time   COLORURINE RED* 06/24/2014 2320   APPEARANCEUR CLOUDY* 06/24/2014 2320   LABSPEC 1.013 06/24/2014 2320   PHURINE 7.0 06/24/2014 2320   GLUCOSEU 500* 06/24/2014 2320   HGBUR LARGE* 06/24/2014 2320   BILIRUBINUR NEGATIVE 06/24/2014 2320   KETONESUR NEGATIVE 06/24/2014 2320   PROTEINUR 100* 06/24/2014 2320   UROBILINOGEN 1.0 06/24/2014 2320   NITRITE NEGATIVE 06/24/2014 2320   LEUKOCYTESUR NEGATIVE 06/24/2014 2320       No results found.         Shanda Howells MD  Pager: [phone]

## 2014-06-25 NOTE — Progress Notes (Addendum)
[facility] PROGRESS NOTE  [name] [id number] DOB: Jul 11, 1946 DOA: 06/24/2014 PCP: Irven Shelling, MD  Assessment/Plan: 1-Syncope;  Echo and doppler ordered.  Check orthostatic vitals.  Might need holter monitor.   2-Right side weakness; sensory changes; neurology following. MRI negative for acute stroke. Marland Kitchen   3-Chest Pain  -prior episodes in past followed by [facility] Marlou Porch)  -had nuc med study 01/2014-unsure of results  -no active CP currently  -cycle enzymes.   4-Hematuria/neurogenic bladder  -traumatic in etiology  -clearing in view of urinary stream from foley  -urology consulted, appreciate evaluation.   5-Abd Pain  -mild-moderate generalized abd pain on exam  -CT abd and pelvis negative.   6-Mild fever leukocytosis. Will start empirically cipro. Follow urine culture.   Code Status: full code.  Family Communication: care discussed with patient Disposition Plan: remain inpatient   Consultants: Neurology urology  Procedures: none  Antibiotics: none HPI/Subjective: Still with pain penes, he still bleeding small amount.  Refuse going to SNF.    Objective: Filed Vitals:   06/25/14 1630  BP: 126/50  Pulse: 71  Temp: 98.6 F (37 C)  Resp: 20    Intake/Output Summary (Last 24 hours) at 06/25/14 1834 Last data filed at 06/25/14 0221  Gross per 24 hour  Intake      0 ml  Output   1400 ml  Net  -1400 ml   Filed Weights   06/25/14 0533  Weight: 162.524 kg (358 lb 4.8 oz)    Exam:   General:  Alert in no distress.   Cardiovascular: S 1, S 2 RRR  Respiratory: decreases breath sounds.   Abdomen: obese, no TN  Musculoskeletal: no edema.   Data Reviewed: Basic Metabolic Panel:  Recent Labs Lab 06/24/14 2245 06/25/14 0130  NA 134*  --   K 3.4*  --   CL 96  --   CO2 26  --   GLUCOSE 266*  --   BUN 5*  --   CREATININE 0.77  --   CALCIUM 8.1*  --   MG  --  1.7   Liver Function Tests: No results found for this  basename: AST, ALT, ALKPHOS, BILITOT, PROT, ALBUMIN,  in the last 168 hours No results found for this basename: LIPASE, AMYLASE,  in the last 168 hours No results found for this basename: AMMONIA,  in the last 168 hours CBC:  Recent Labs Lab 06/24/14 2245  WBC 12.4*  NEUTROABS 10.1*  HGB 13.2  HCT 39.0  MCV 88.6  PLT 217   Cardiac Enzymes:  Recent Labs Lab 06/25/14 0130  TROPONINI <0.30   BNP (last 3 results)  Recent Labs  12/23/13 0936 03/21/14 1551 04/01/14 1431  PROBNP 1247.0* 2003.0* 658.6*   CBG:  Recent Labs Lab 06/24/14 2259 06/25/14 0407 06/25/14 0758 06/25/14 1129 06/25/14 1701  GLUCAP 228* 272* 266* 253* 244*    Recent Results (from the past 240 hour(s))  MRSA PCR SCREENING     Status: Abnormal   Collection Time    06/25/14  5:34 AM      Result Value Ref Range Status   MRSA by PCR POSITIVE (*) NEGATIVE Final   Comment:            The GeneXpert MRSA Assay (FDA     approved for NASAL specimens     only), is one component of a     comprehensive MRSA colonization     surveillance program. It is not  intended to diagnose MRSA     infection nor to guide or     monitor treatment for     MRSA infections.     CRITICAL RESULT CALLED TO, READ BACK BY AND VERIFIED WITH:     Bernita Raisin RN 06/25/14 [id number] COSTELLO B     Studies: Ct Abdomen Pelvis Wo Contrast  06/25/2014   CLINICAL DATA:  Abdominal pain. Gross hematuria after urinary catheter change at home 06/24/2014.  EXAM: CT ABDOMEN AND PELVIS WITHOUT CONTRAST  TECHNIQUE: Multidetector CT imaging of the abdomen and pelvis was performed following the standard protocol without IV contrast.  COMPARISON:  None.  FINDINGS: BODY WALL: Unremarkable.  LOWER CHEST: Coronary atherosclerosis. Aortic valve calcifications/sclerosis.  ABDOMEN/PELVIS:  Liver: No focal abnormality.  Biliary: Cholecystectomy.  Pancreas: Unremarkable.  Spleen: Unremarkable.  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  Kidneys and ureters:  No hydronephrosis or stone.  Bladder: Gas in the urinary bladder is likely from instrumentation. There is a Foley catheter which is partially visualized, with balloon inflated in the mid anterior urethra.  Reproductive: The prostate gland is either small or surgically absent.  Bowel: Status post gastric bypass. No bowel obstruction or evidence of inflammation. Normal appendix.  Retroperitoneum: No mass or adenopathy.  Peritoneum: No ascites or pneumoperitoneum.  Vascular: No acute abnormality.  OSSEOUS: Bilateral hip osteoarthritis, with joint narrowing advanced on the right. Advanced degenerative disc disease at L4-5, with moderate anterior slip from facet osteoarthritis. There is notable spinal canal stenosis at this level. Vertebral body wedging at the thoracolumbar junction without acute fracture.  Results were called by telephone at the time of interpretation on 06/25/2014 at 3:50 am to Dr. Shanda Howells , who was already aware of these results.  IMPRESSION: 1. Malpositioned Foley catheter with balloon inflated in the anterior urethra. 2. Incidental findings are described above.   Electronically Signed   By: Jorje Guild M.D.   On: 06/25/2014 03:51   Dg Chest 2 View  06/25/2014   CLINICAL DATA:  Stroke.  Initial encounter  EXAM: CHEST  2 VIEW  COMPARISON:  04/01/2014  FINDINGS: Cardiomegaly which is accentuated by a low lung volumes. Tortuous thoracic aorta which has also been noted previously. There is diffuse interstitial opacity with cephalized blood flow. No evidence for effusion or pneumothorax. Surgical clips in the gastric region.  IMPRESSION: CHF pattern.   Electronically Signed   By: Jorje Guild M.D.   On: 06/25/2014 01:49   Ct Head Wo Contrast  06/25/2014   CLINICAL DATA:  Stroke.  Syncope.  Initial encounter  EXAM: CT HEAD WITHOUT CONTRAST  TECHNIQUE: Contiguous axial images were obtained from the base of the skull through the vertex without intravenous contrast.  COMPARISON:   12/23/2013  FINDINGS: Skull and Sinuses:Negative for fracture or destructive process. The mastoids, middle ears, and imaged paranasal sinuses are clear.  Orbits: No acute abnormality.  Brain: No evidence of acute abnormality, such as acute infarction, hemorrhage, hydrocephalus, or mass lesion/mass effect. Globus pallidus and thalamic calcifications which are symmetric and likely related to calcium dysregulation.  IMPRESSION: No acute intracranial findings.   Electronically Signed   By: Jorje Guild M.D.   On: 06/25/2014 03:31   Mr Jodene Nam Head Wo Contrast  06/25/2014   CLINICAL DATA:  Hematuria and syncope. Stroke. Confusion. The examination had to be discontinued prior to completion due to patient refusal for further imaging.  EXAM: MRI HEAD WITHOUT CONTRAST  MRA HEAD WITHOUT CONTRAST  TECHNIQUE: Multiplanar, multiecho  pulse sequences of the brain and surrounding structures were obtained without intravenous contrast. Angiographic images of the head were obtained using MRA technique without contrast.  COMPARISON:  CT head from the same day.  MRI brain 12/25/2013.  FINDINGS: MRI HEAD FINDINGS  No acute infarct, hemorrhage, or mass lesion is present. The ventricles are of normal size. Mild dilated perivascular spaces are present without evidence for acute or focal infarct. No significant white matter disease is present.  Flow is present in the major intracranial arteries. The globes and orbits are intact. Mild mucosal thickening is present in the ethmoid air cells. The remaining paranasal sinuses and the mastoid air cells are clear.  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 The internal carotid arteries are otherwise within normal limits. The A1 and M1 segments are normal. The MCA bifurcations are within normal limits. A 1 M1 segments are normal. The anterior communicating artery is patent. There is minimal attenuation of  distal branch vessels.  The left vertebral artery is the dominant vessel. PICA origins are not well visualized. A echo branches are noted bilaterally. The basilar artery is within normal limits. Both posterior cerebral arteries originate from the basilar tip. There is asymmetric attenuation of the right P2 segment with a moderate focal stenosis. Mild to moderate attenuation of distal PCA branch vessels is noted bilaterally.  IMPRESSION: 1. Normal MRI of the brain for age. 2. 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 3. Focal moderate stenosis of the right P2 segment with mild to moderate attenuation of PCA branch vessels bilaterally.   Electronically Signed   By: Lawrence Santiago M.D.   On: 06/25/2014 14:40   Mr Brain Wo Contrast  06/25/2014   CLINICAL DATA:  Hematuria and syncope. Stroke. Confusion. The examination had to be discontinued prior to completion due to patient refusal for further imaging.  EXAM: MRI HEAD WITHOUT CONTRAST  MRA HEAD WITHOUT CONTRAST  TECHNIQUE: Multiplanar, multiecho pulse sequences of the brain and surrounding structures were obtained without intravenous contrast. Angiographic images of the head were obtained using MRA technique without contrast.  COMPARISON:  CT head from the same day.  MRI brain 12/25/2013.  FINDINGS: MRI HEAD FINDINGS  No acute infarct, hemorrhage, or mass lesion is present. The ventricles are of normal size. Mild dilated perivascular spaces are present without evidence for acute or focal infarct. No significant white matter disease is present.  Flow is present in the major intracranial arteries. The globes and orbits are intact. Mild mucosal thickening is present in the ethmoid air cells. The remaining paranasal sinuses and the mastoid air cells are clear.  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 The internal carotid arteries are otherwise within normal  limits. The A1 and M1 segments are normal. The MCA bifurcations are within normal limits. A 1 M1 segments are normal. The anterior communicating artery is patent. There is minimal attenuation of distal branch vessels.  The left vertebral artery is the dominant vessel. PICA origins are not well visualized. A echo branches are noted bilaterally. The basilar artery is within normal limits. Both posterior cerebral arteries originate from the basilar tip. There is asymmetric attenuation of the right P2 segment with a moderate focal stenosis. Mild to moderate attenuation of distal PCA branch vessels is noted bilaterally.  IMPRESSION: 1. Normal MRI of  the brain for age. 2. 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 3. Focal moderate stenosis of the right P2 segment with mild to moderate attenuation of PCA branch vessels bilaterally.   Electronically Signed   By: Lawrence Santiago M.D.   On: 06/25/2014 14:40   Mr Attempted Daymon Larsen Report  06/25/2014   This examination belongs to an outside facility and is stored  here for comparison purposes only.  Contact the originating outside  institution for any associated report or interpretation.   Scheduled Meds: .  stroke: mapping our early stages of recovery book   Does not apply Once  . aspirin  300 mg Rectal Daily   Or  . aspirin  325 mg Oral Daily  . Chlorhexidine Gluconate Cloth  6 each Topical Q0600  . furosemide  40 mg Oral Daily  . insulin aspart  0-9 Units Subcutaneous 6 times per day  . insulin aspart protamine- aspart  55 Units Subcutaneous BID WC  . metoprolol tartrate  25 mg Oral BID  . mupirocin ointment  1 application Nasal BID  . pantoprazole  40 mg Oral Daily   Continuous Infusions:   Active Problems:   Syncope   Hematuria   Abdominal pain    Time spent: 35 minutes.     Niel Hummer A  [facility] Pager [phone]. If 7PM-7AM, please contact night-coverage at [email or web address], password [facility] 06/25/2014, 6:34 PM   LOS: 1 day

## 2014-06-25 NOTE — ED Notes (Signed)
Dr. Risa Grill at bedside

## 2014-06-25 NOTE — Progress Notes (Addendum)
Inpatient Diabetes Program Recommendations  AACE/ADA: New Consensus Statement on Inpatient Glycemic Control (2013)  Target Ranges:  Prepandial:   less than 140 mg/dL      Peak postprandial:   less than 180 mg/dL (1-2 hours)      Critically ill patients:  140 - 180 mg/dL   Reason for Assessment:  Results for Brian, Wood (MRN [id number]) as of 06/25/2014 12:25  Ref. Range 06/24/2014 22:59 06/25/2014 04:07 06/25/2014 07:58 06/25/2014 11:29  Glucose-Capillary Latest Range: 70-99 mg/dL 228 (H) 272 (H) 266 (H) 253 (H)   Diabetes history: Diabetes Mellitus Outpatient Diabetes medications: Novolog 70/30 mix 55 units bid Current orders for Inpatient glycemic control:  Novolog sensitive q 4 hours, Novolog 70/30 mix 55 units bid  It appears that 70/30 was held this morning per RN documentation. Please consider ordering at least 1/2 of patients home dose of Novolog 70/30 mix-  Consider 27 units 70/30 mix bid with meals.   Thanks, Adah Perl, RN, BC-ADM Inpatient Diabetes Coordinator Pager [phone]

## 2014-06-25 NOTE — Consult Note (Signed)
Urology Consult  Referring physician: Shanda Howells, MD Reason for referral: Gross hematuria, blood per urethra, malpositioned Foley catheter  History of Present Illness: 68 year old male patient of Dr. 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 The patient was having Foley catheter exchanges in our office but recently that was changed to home health due to difficulty with mobility and transportation. Catheter was apparently replaced by home health agency today but it was clear that that catheter was malpositioned from the start. The patient apparently complained of severe pain. He began experiencing gross hematuria. He also had a syncopal episode and is being assessed for possible neurologic event. He did undergo CT imaging which revealed a Foley balloon inflated in the anterior urethra. That catheter was removed and a Foley catheter was inserted properly by the [facility] long nursing staff in the emergency room. Immediate blood per urethra was noted which did improve with compression. Urine draining from the bladder currently is clear.  Past Medical History  Diagnosis Date  . Obese   . Hypertension   . Diabetes mellitus   . Diabetic diarrhea   . Sleep apnea   . Diabetic neuropathy, painful   . Chronic indwelling Foley catheter   . Bladder spasms   . Complication of anesthesia   . PONV (postoperative nausea and vomiting)   . Hyperlipidemia   . GERD (gastroesophageal reflux disease)   . Neurogenic bladder   . Stroke 2013   Past Surgical History  Procedure Laterality Date  . Right tkr    . Gastroplasty    . Cholecystectomy    . Total knee arthroplasty      Medications: Prior to Admission:  (Not in a hospital admission)  Allergies:  Allergies  Allergen Reactions  . Ace Inhibitors Swelling  . Lipitor [Atorvastatin Calcium] Swelling  . Metformin And Related Swelling  . Cefadroxil Hives  . Cephalexin Hives  . Methocarbamol Other (See  Comments)    Feels like is in "rocky boat"  . Morphine And Related Other (See Comments)    Sweating, feels like is "in rocky boat."  . Robaxin [Methocarbamol] Other (See Comments)    Feels like he is shaky    Family History  Problem Relation Age of Onset  . Hypertension Mother   . Diabetes type I Father   . Cancer Brother      Testicular Cancer  . Cancer Brother     Leukemia    Social History:  reports that he quit smoking about 37 years ago. He has never used smokeless tobacco. He reports that he does not drink alcohol or use illicit drugs.  ROS weakness, mild chest discomfort, penile pain otherwise negative  Physical Exam:  Vital signs in last 24 hours: Temp:  [98.2 F (36.8 C)-99.7 F (37.6 C)] 99.7 F (37.6 C) (10/16 0352) Pulse Rate:  [72-99] 96 (10/16 0352) Resp:  [18-28] 22 (10/16 0352) BP: (149-170)/(62-75) 170/72 mmHg (10/16 0352) SpO2:  [93 %-98 %] 96 % (10/16 0352)  Constitutional: Vital signs reviewed. WD WN in NAD Head: Normocephalic and atraumatic   Eyes: PERRL, No scleral icterus.  Neck: Supple No  Gross JVD, mass, thyromegaly, or carotid bruit present.  Cardiovascular: RRR Pulmonary/Chest: Normal effort Abdominal: Soft. Non-tender, non-distended, bowel sounds are normal, no masses, organomegaly, or guarding present.  Genitourinary: Indwelling Foley catheter with a small amount of bleeding around the catheter. Urine currently draining clear yellow. Extremities: No cyanosis or edema  Neurological: Grossly non-focal.  Skin: Warm,very dry and  intact. No rash, cyanosis   Laboratory Data:  Results for orders placed during the hospital encounter of 06/24/14 (from the past 72 hour(s))  CBC WITH DIFFERENTIAL     Status: Abnormal   Collection Time    06/24/14 10:45 PM      Result Value Ref Range   WBC 12.4 (*) 4.0 - 10.5 K/uL   RBC 4.40  4.22 - 5.81 MIL/uL   Hemoglobin 13.2  13.0 - 17.0 g/dL   HCT 39.0  39.0 - 52.0 %   MCV 88.6  78.0 - 100.0 fL   MCH  30.0  26.0 - 34.0 pg   MCHC 33.8  30.0 - 36.0 g/dL   RDW 13.6  11.5 - 15.5 %   Platelets 217  150 - 400 K/uL   Neutrophils Relative % 82 (*) 43 - 77 %   Neutro Abs 10.1 (*) 1.7 - 7.7 K/uL   Lymphocytes Relative 9 (*) 12 - 46 %   Lymphs Abs 1.2  0.7 - 4.0 K/uL   Monocytes Relative 7  3 - 12 %   Monocytes Absolute 0.8  0.1 - 1.0 K/uL   Eosinophils Relative 2  0 - 5 %   Eosinophils Absolute 0.3  0.0 - 0.7 K/uL   Basophils Relative 0  0 - 1 %   Basophils Absolute 0.1  0.0 - 0.1 K/uL  BASIC METABOLIC PANEL     Status: Abnormal   Collection Time    06/24/14 10:45 PM      Result Value Ref Range   Sodium 134 (*) 137 - 147 mEq/L   Potassium 3.4 (*) 3.7 - 5.3 mEq/L   Chloride 96  96 - 112 mEq/L   CO2 26  19 - 32 mEq/L   Glucose, Bld 266 (*) 70 - 99 mg/dL   BUN 5 (*) 6 - 23 mg/dL   Creatinine, Ser 0.77  0.50 - 1.35 mg/dL   Calcium 8.1 (*) 8.4 - 10.5 mg/dL   GFR calc non Af Amer >90  >90 mL/min   GFR calc Af Amer >90  >90 mL/min   Comment: (NOTE)     The eGFR has been calculated using the CKD EPI equation.     This calculation has not been validated in all clinical situations.     eGFR's persistently <90 mL/min signify possible Chronic Kidney     Disease.   Anion gap 12  5 - 15  CBG MONITORING, ED     Status: Abnormal   Collection Time    06/24/14 10:59 PM      Result Value Ref Range   Glucose-Capillary 228 (*) 70 - 99 mg/dL   Comment 1 Notify RN     Comment 2 Documented in Chart    I-STAT CG4 LACTIC ACID, ED     Status: None   Collection Time    06/24/14 11:11 PM      Result Value Ref Range   Lactic Acid, Venous 1.97  0.5 - 2.2 mmol/L  URINALYSIS, ROUTINE W REFLEX MICROSCOPIC     Status: Abnormal   Collection Time    06/24/14 11:20 PM      Result Value Ref Range   Color, Urine RED (*) YELLOW   Comment: BIOCHEMICALS MAY BE AFFECTED BY COLOR   APPearance CLOUDY (*) CLEAR   Specific Gravity, Urine 1.013  1.005 - 1.030   pH 7.0  5.0 - 8.0   Glucose, UA 500 (*) NEGATIVE mg/dL    Hgb urine dipstick  LARGE (*) NEGATIVE   Bilirubin Urine NEGATIVE  NEGATIVE   Ketones, ur NEGATIVE  NEGATIVE mg/dL   Protein, ur 100 (*) NEGATIVE mg/dL   Urobilinogen, UA 1.0  0.0 - 1.0 mg/dL   Nitrite NEGATIVE  NEGATIVE   Leukocytes, UA NEGATIVE  NEGATIVE  URINE MICROSCOPIC-ADD ON     Status: None   Collection Time    06/24/14 11:20 PM      Result Value Ref Range   RBC / HPF TOO NUMEROUS TO COUNT  <3 RBC/hpf   Urine-Other URINALYSIS PERFORMED ON SUPERNATANT    TROPONIN I     Status: None   Collection Time    06/25/14  1:30 AM      Result Value Ref Range   Troponin I <0.30  <0.30 ng/mL   Comment:            Due to the release kinetics of cTnI,     a negative result within the first hours     of the onset of symptoms does not rule out     myocardial infarction with certainty.     If myocardial infarction is still suspected,     repeat the test at appropriate intervals.  MAGNESIUM     Status: None   Collection Time    06/25/14  1:30 AM      Result Value Ref Range   Magnesium 1.7  1.5 - 2.5 mg/dL  CBG MONITORING, ED     Status: Abnormal   Collection Time    06/25/14  4:07 AM      Result Value Ref Range   Glucose-Capillary 272 (*) 70 - 99 mg/dL   No results found for this or any previous visit (from the past 240 hour(s)). Creatinine:  Recent Labs  06/24/14 2245  CREATININE 0.77   Baseline Creatinine:  Impression/Assessment:  Gross hematuria and blood per urethra secondary to urethral trauma from malpositioned Foley catheter. It is apparent that this catheter was malpositioned from the start. The home health agency should be identified and this incident reported to them. The patient was unable to give me the name of the home health agency or the nurse. Blood per urethra is going to be extremely, and after this type of trauma secondary to inflating the balloon within the urethra. The bleeding should resolve in the next 24-48 hours. Urine is already relatively clear. Foley  will need to be changed by experienced home health nurse or the urology clinic in the future. Patient did express interest potentially in the suprapubic tube which would be reasonable and he should discuss with Dr. Alinda Money.  Plan:  Contact Dr. Alinda Money with further urologic issues or questions. Or the physician on-call if after hours.  Marye Eagen S 06/25/2014, 4:14 AM

## 2014-06-25 NOTE — Progress Notes (Signed)
UR Completed Martha Ellerby Graves-Bigelow, RN,BSN [phone]  

## 2014-06-25 NOTE — ED Notes (Signed)
Dentures sent with pt

## 2014-06-25 NOTE — Consult Note (Addendum)
Consult Reason for Consult:right sided weakness Referring Physician: Dr Lenard Simmer  CC: right sided weakness and sensory change  HPI: [name] is an 68 y.o. male with significant past medical history of multiple medical problems including morbid obesity, limited mobility, chronic indwelling foley 2/2 neurogenic bladder, chronic diastolic heart failure, CAD, IDDM presenting with hematuria, syncope and overall functional decline. Initial ED plan was for pt to go home. However, upon ambulation, pt had 4-5 episodes of syncope and confusion per nursing. Per description events consisted of brief LOC, no generalized shaking noted. Pt does not have any recollection of incident. In further discussion with pt, he states that he has had some worsening R sided weakness and numbness that is fairly new from baseline. Unsure of duration. Initially said it started today and then said he has had right sided weakness for a long time. Has also had some mild central chest pressure over past 24 hours. No active CP currently. Denies any confusion or dyspnea.   Of note he has had multiple admissions in the past for orthostatic hypotension. He has been evaluated in the past for suspected CVA. He is currently taking ASA 81mg  daily.    Past Medical History  Diagnosis Date  . Obese   . Hypertension   . Diabetes mellitus   . Diabetic diarrhea   . Sleep apnea   . Diabetic neuropathy, painful   . Chronic indwelling Foley catheter   . Bladder spasms   . Complication of anesthesia   . PONV (postoperative nausea and vomiting)   . Hyperlipidemia   . GERD (gastroesophageal reflux disease)   . Neurogenic bladder   . Stroke 2013    Past Surgical History  Procedure Laterality Date  . Right tkr    . Gastroplasty    . Cholecystectomy    . Total knee arthroplasty      Family History  Problem Relation Age of Onset  . Hypertension Mother   . Diabetes type I Father   . Cancer Brother      Testicular Cancer  .  Cancer Brother     Leukemia    Social History:  reports that he quit smoking about 37 years ago. He has never used smokeless tobacco. He reports that he does not drink alcohol or use illicit drugs.  Allergies  Allergen Reactions  . Ace Inhibitors Swelling  . Lipitor [Atorvastatin Calcium] Swelling  . Metformin And Related Swelling  . Cefadroxil Hives  . Cephalexin Hives  . Methocarbamol Other (See Comments)    Feels like is in "rocky boat"  . Morphine And Related Other (See Comments)    Sweating, feels like is "in rocky boat."  . Robaxin [Methocarbamol] Other (See Comments)    Feels like he is shaky    Medications:  Scheduled: .  stroke: mapping our early stages of recovery book   Does not apply Once  . aspirin  300 mg Rectal Daily   Or  . aspirin  325 mg Oral Daily  . enoxaparin (LOVENOX) injection  30 mg Subcutaneous Q24H  . furosemide  40 mg Oral Daily  . insulin aspart  0-9 Units Subcutaneous 6 times per day  . insulin aspart protamine- aspart  55 Units Subcutaneous BID WC  . metoprolol tartrate  25 mg Oral BID  . pantoprazole  40 mg Oral Daily    ROS: Out of a complete 14 system review, the patient complains of only the following symptoms, and all other reviewed systems are negative.  Physical Examination: Blood pressure 162/62, pulse 93, temperature 98.7 F (37.1 C), temperature source Oral, resp. rate 18, SpO2 97.00%.  Neurologic Examination Mental Status: Alert, oriented, thought content appropriate. Appears in mild distress. Speech fluent without evidence of aphasia.  Able to follow 3 step commands without difficulty. Cranial Nerves: II: funduscopic exam wnl bilaterally, visual fields grossly normal, pupils equal, round, reactive to light and accommodation III,IV, VI: ptosis not present, extra-ocular motions intact bilaterally V,VII: smile symmetric, facial light touch sensation normal bilaterally VIII: hearing normal bilaterally IX,X: gag reflex  present XI: trapezius strength/neck flexion strength normal bilaterally XII: tongue strength normal  Motor: Right : Upper extremity    Left:     Upper extremity 5/5 deltoid       5/5 deltoid 5/5 biceps      5/5 biceps  5/5 triceps      5/5 triceps 5/5wrist flexion     5/5 wrist flexion 5/5 wrist extension     5/5 wrist extension 5/5 hand grip      5/5 hand grip  Lower extremity     Lower extremity 4+/5 hip flexor      4+/5 hip flexor 4+/5 quadricep     5-/5 quadriceps  5-/5 hamstrings     5-/5 hamstrings 5/5 plantar flexion       5/5 plantar flexion 5/5 plantar extension     5/5 plantar extension -weakness appears pain related Tone and bulk:normal tone throughout; no atrophy noted Sensory: decreased LT, PP, proprioception bilateral LE Deep Tendon Reflexes: 1+ and symmetric throughout, absent AJs bilaterally Plantars: Right: downgoing   Left: downgoing Gait: deferred due to pain  Laboratory Studies:   Basic Metabolic Panel:  Recent Labs Lab 06/24/14 2245  NA 134*  K 3.4*  CL 96  CO2 26  GLUCOSE 266*  BUN 5*  CREATININE 0.77  CALCIUM 8.1*    Liver Function Tests: No results found for this basename: AST, ALT, ALKPHOS, BILITOT, PROT, ALBUMIN,  in the last 168 hours No results found for this basename: LIPASE, AMYLASE,  in the last 168 hours No results found for this basename: AMMONIA,  in the last 168 hours  CBC:  Recent Labs Lab 06/24/14 2245  WBC 12.4*  NEUTROABS 10.1*  HGB 13.2  HCT 39.0  MCV 88.6  PLT 217    Cardiac Enzymes: No results found for this basename: CKTOTAL, CKMB, CKMBINDEX, TROPONINI,  in the last 168 hours  BNP: No components found with this basename: POCBNP,   CBG:  Recent Labs Lab 06/24/14 2259  GLUCAP 228*    Microbiology: Results for orders placed during the hospital encounter of 04/01/14  MRSA PCR SCREENING     Status: None   Collection Time    04/01/14  9:06 PM      Result Value Ref Range Status   MRSA by PCR NEGATIVE   NEGATIVE Final   Comment:            The GeneXpert MRSA Assay (FDA     approved for NASAL specimens     only), is one component of a     comprehensive MRSA colonization     surveillance program. It is not     intended to diagnose MRSA     infection nor to guide or     monitor treatment for     MRSA infections.    Coagulation Studies: No results found for this basename: LABPROT, INR,  in the last 72 hours  Urinalysis:  Recent Labs  Lab 06/24/14 2320  COLORURINE RED*  LABSPEC 1.013  PHURINE 7.0  GLUCOSEU 500*  HGBUR LARGE*  BILIRUBINUR NEGATIVE  KETONESUR NEGATIVE  PROTEINUR 100*  UROBILINOGEN 1.0  NITRITE NEGATIVE  LEUKOCYTESUR NEGATIVE    Lipid Panel:     Component Value Date/Time   CHOL 178 10/24/2012 0425   TRIG 133 10/24/2012 0425   HDL 35* 10/24/2012 0425   CHOLHDL 5.1 10/24/2012 0425   VLDL 27 10/24/2012 0425   LDLCALC 116* 10/24/2012 0425    HgbA1C:  Lab Results  Component Value Date   HGBA1C 9.3* 03/21/2014    Urine Drug Screen:     Component Value Date/Time   LABOPIA NONE DETECTED 10/10/2011 0253   COCAINSCRNUR NONE DETECTED 10/10/2011 0253   LABBENZ NONE DETECTED 10/10/2011 0253   AMPHETMU NONE DETECTED 10/10/2011 0253   THCU NONE DETECTED 10/10/2011 0253   LABBARB NONE DETECTED 10/10/2011 0253    Alcohol Level: No results found for this basename: ETH,  in the last 168 hours    Imaging: No results found.   Assessment/Plan:  68y/o with multiple risk factors presenting for evaluation of right sided weakness and sensory changes. In ED also noted to have multiple syncopal episodes. Exam and history appear inconsistent. Weakness appears more generalized and effort dependent. CT head negative for acute infarcts.   1)Check MRI/A brain 2)Check Carotid doppler and 2D echo in light of frequent syncopal episodes 3)Increase ASA to 325mg  daily 4)Check B12, B1, TSH 5)Neurology to follow  Jim Like, DO [facility] [phone]  If 7pm- 7am,  please page neurology on call as listed in [place]. 06/25/2014, 1:37 AM

## 2014-06-25 NOTE — ED Notes (Signed)
Pt seen by Urologist and is ready for transfer to [facility].

## 2014-06-25 NOTE — ED Notes (Signed)
Patient in MRI, unable to obtain labs.

## 2014-06-26 DIAGNOSIS — E118 Type 2 diabetes mellitus with unspecified complications: Secondary | ICD-10-CM

## 2014-06-26 DIAGNOSIS — R600 Localized edema: Secondary | ICD-10-CM

## 2014-06-26 DIAGNOSIS — R319 Hematuria, unspecified: Secondary | ICD-10-CM

## 2014-06-26 LAB — BASIC METABOLIC PANEL
Anion gap: 11 (ref 5–15)
BUN: 6 mg/dL (ref 6–23)
CALCIUM: 8 mg/dL — AB (ref 8.4–10.5)
CO2: 25 mEq/L (ref 19–32)
Chloride: 98 mEq/L (ref 96–112)
Creatinine, Ser: 0.6 mg/dL (ref 0.50–1.35)
GFR calc Af Amer: >90 mL/min (ref >90)
GLUCOSE: 172 mg/dL — AB (ref 70–99)
Potassium: 3.4 mEq/L — ABNORMAL LOW (ref 3.7–5.3)
SODIUM: 134 meq/L — AB (ref 137–147)

## 2014-06-26 LAB — CBC
HCT: 38.5 % — ABNORMAL LOW (ref 39.0–52.0)
Hemoglobin: 12.9 g/dL — ABNORMAL LOW (ref 13.0–17.0)
MCH: 30.2 pg (ref 26.0–34.0)
MCHC: 33.5 g/dL (ref 30.0–36.0)
MCV: 90.2 fL (ref 78.0–100.0)
Platelets: 181 10*3/uL (ref 150–400)
RBC: 4.27 MIL/uL (ref 4.22–5.81)
RDW: 13.8 % (ref 11.5–15.5)
WBC: 7.2 10*3/uL (ref 4.0–10.5)

## 2014-06-26 LAB — TROPONIN I
Troponin I: <0.3 ng/mL (ref <0.30)
Troponin I: <0.3 ng/mL (ref <0.30)

## 2014-06-26 MED ORDER — POTASSIUM CHLORIDE 20 MEQ/15ML (10%) PO LIQD
40.0000 meq | Freq: Once | ORAL | Status: AC
  Administered 2014-06-26: 40 meq via ORAL
  Filled 2014-06-26: qty 30

## 2014-06-26 MED ORDER — INSULIN ASPART PROT & ASPART (70-30 MIX) 100 UNIT/ML ~~LOC~~ SUSP
15.0000 [IU] | Freq: Two times a day (BID) | SUBCUTANEOUS | Status: DC
  Administered 2014-06-26 – 2014-06-28 (×3): 15 [IU] via SUBCUTANEOUS
  Filled 2014-06-26: qty 10

## 2014-06-26 MED ORDER — POTASSIUM CHLORIDE CRYS ER 20 MEQ PO TBCR
40.0000 meq | EXTENDED_RELEASE_TABLET | Freq: Once | ORAL | Status: DC
  Filled 2014-06-26 (×2): qty 2

## 2014-06-26 NOTE — Progress Notes (Signed)
*  PRELIMINARY RESULTS* Vascular Ultrasound Lower extremity venous duplex has been completed.  Preliminary findings: no evidence of DVT  Landry Mellow, RDMS, RVT  06/26/2014, 3:03 PM

## 2014-06-26 NOTE — Progress Notes (Signed)
Subjective: No significant changes.   Exam: Filed Vitals:   06/26/14 0934  BP: 154/58  Pulse: 70  Temp: 98 F (36.7 C)  Resp: 20   Gen: In bed, NAD MS: awake, alert.  [id number], EOMI Motor: possible mild right sided weakness with give-way Sensory:decreased on right.    Impression: 68 yo M with inconsistent sensory/weakness findings and negative MRI. Could consider continuing cardiac syncope workup, but no further workup needed from neurological perspective.   Recommendations: 1)continue PT 2) Neurology to sign off at this time. Please call with any further questions or concerns.   Roland Rack, MD [facility] [phone]  If 7pm- 7am, please page neurology on call as listed in [place].

## 2014-06-26 NOTE — Evaluation (Signed)
Physical Therapy Evaluation Patient Details Name: [name] MRN: [id number] DOB: 1946-01-02 Today's Date: 06/26/2014   History of Present Illness  Pt is 68 y.o. year old male with significant past medical history of multiple medical problems including morbid obesity, limited mobility, chronic indwelling foley due neurogenic bladder, chronic diastolic heart failure, CAD, IDDM.  Pt with syncope episodes in ED and reports of right sided weakness.  No findings on MRI.    Clinical Impression  Pt admitted with right sided weakness and syncope episode in ED, r/o CVA. Pt currently with functional limitations due to the deficits listed below (see PT Problem List).  Pt refusing SNF at this time however pt at risk for falls and overall safety with only PRN supervision available by neighbors.  Pt will benefit from skilled PT to increase their independence and safety with mobility to allow discharge to the venue listed below.      Follow Up Recommendations SNF (However pt refusing at this time. )    Equipment Recommendations  None recommended by PT    Recommendations for Other Services       Precautions / Restrictions Precautions Precautions: Fall (near syncope episode on evaluation)      Mobility  Bed Mobility Overal bed mobility: Needs Assistance Bed Mobility: Supine to Sit;Sit to Supine     Supine to sit: Min assist Sit to supine: Mod assist   General bed mobility comments: Pt with heavy use of hand rail to complete supine > sit as well as moderate assistance to complete sit > supine with assistance with LE.   Transfers Overall transfer level: Needs assistance Equipment used: Rolling walker (2 wheeled) Transfers: Sit to/from Stand Sit to Stand: Min guard            Ambulation/Gait Ambulation/Gait assistance: Min guard Ambulation Distance (Feet): 20 Feet Assistive device: Rolling walker (2 wheeled) Gait Pattern/deviations: Step-through pattern;Shuffle;Wide base of  support (forward flexed posture)        Stairs            Wheelchair Mobility    Modified Rankin (Stroke Patients Only)       Balance                                             Pertinent Vitals/Pain      Home Living Family/patient expects to be discharged to:: Private residence Living Arrangements: Alone Available Help at Discharge: Friend(s);Available PRN/intermittently Type of Home: House Home Access: Stairs to enter Entrance Stairs-Rails: Psychiatric nurse of Steps: 1 Home Layout: One level Home Equipment: Cane - single point;Grab bars - toilet;Grab bars - tub/shower;Electric scooter;Wheelchair - manual;Walker - 2 wheels Additional Comments: Pt takes sponge bath at sink. W/c and scooter do not fit inside the house so pt usually uses cane.     Prior Function Level of Independence: Needs assistance   Gait / Transfers Assistance Needed: pt states he hasn't really walked at sNF           Hand Dominance   Dominant Hand: Right    Extremity/Trunk Assessment               Lower Extremity Assessment: Generalized weakness         Communication   Communication: No difficulties  Cognition Arousal/Alertness: Awake/alert  General Comments      Exercises        Assessment/Plan    PT Assessment Patient needs continued PT services  PT Diagnosis Difficulty walking;Generalized weakness   PT Problem List Decreased strength;Decreased activity tolerance;Decreased balance;Decreased mobility;Decreased knowledge of use of DME;Impaired sensation  PT Treatment Interventions DME instruction;Gait training;Stair training;Therapeutic activities;Therapeutic exercise;Functional mobility training;Balance training;Patient/family education   PT Goals (Current goals can be found in the Care Plan section) Acute Rehab PT Goals Patient Stated Goal: To return home as soon as possible  PT Goal  Formulation: With patient Time For Goal Achievement: 07/03/14 Potential to Achieve Goals: Good    Frequency Min 3X/week   Barriers to discharge        Co-evaluation               End of Session   Activity Tolerance: Patient limited by fatigue (Near syncope episode sitting EOB after ambulation) Patient left: in bed;with call bell/phone within reach Nurse Communication: Mobility status         Time: [id number] PT Time Calculation (min): 50 min   Charges:   PT Evaluation $Initial PT Evaluation Tier I: 1 Procedure PT Treatments $Gait Training: 8-22 mins $Therapeutic Activity: 8-22 mins   PT G Codes:          Lyndall Windt 07-12-2014, 9:27 AM  Antoine Poche, [place] DPT [phone]

## 2014-06-26 NOTE — Progress Notes (Signed)
[facility] PROGRESS NOTE  [name] [name] DOB: 08-Mar-1946 DOA: 06/24/2014 PCP: Irven Shelling, MD  Assessment/Plan: 1-Syncope;  Echo and doppler ordered.  Check orthostatic vitals.  Might need holter monitor.   2-Right side weakness; sensory changes; neurology following. MRI negative for acute stroke. Marland Kitchen No further neurology work up.   3-Chest Pain  -prior episodes in past followed by [facility] Marlou Porch)  -had nuc med study 01/2014-unsure of results  -no active CP currently  -cycle enzymes pending.    4-Hematuria/neurogenic bladder  -traumatic in etiology  -clearing in view of urinary stream from foley  -urology consulted, appreciate evaluation.   5-Abd Pain  -mild-moderate generalized abd pain on exam  -CT abd and pelvis negative.   6-Mild fever leukocytosis.  Follow urine culture. WBC normalized. Urine culture no growth. Will dc cipro  Code Status: full code.  Family Communication: care discussed with patient Disposition Plan: remain inpatient   Consultants: Neurology urology  Procedures: Echo doppler  Antibiotics: None  HPI/Subjective: No chest pain . Bleeding from catheter decreasing.  Pain from catether better.    Objective: Filed Vitals:   06/26/14 0934  BP: 154/58  Pulse: 70  Temp: 98 F (36.7 C)  Resp: 20    Intake/Output Summary (Last 24 hours) at 06/26/14 1345 Last data filed at 06/26/14 0426  Gross per 24 hour  Intake      0 ml  Output   2400 ml  Net  -2400 ml   Filed Weights   06/25/14 0533 06/26/14 0400  Weight: 162.524 kg (358 lb 4.8 oz) 163.105 kg (359 lb 9.3 oz)    Exam:   General:  Alert in no distress.   Cardiovascular: S 1, S 2 RRR  Respiratory: decreases breath sounds.   Abdomen: obese, no TN  Musculoskeletal: no edema.   Data Reviewed: Basic Metabolic Panel:  Recent Labs Lab 06/24/14 2245 06/25/14 0130 06/26/14 0347  NA 134*  --  134*  K 3.4*  --  3.4*  CL 96  --  98  CO2 26  --   25  GLUCOSE 266*  --  172*  BUN 5*  --  6  CREATININE 0.77  --  0.60  CALCIUM 8.1*  --  8.0*  MG  --  1.7  --    Liver Function Tests: No results found for this basename: AST, ALT, ALKPHOS, BILITOT, PROT, ALBUMIN,  in the last 168 hours No results found for this basename: LIPASE, AMYLASE,  in the last 168 hours No results found for this basename: AMMONIA,  in the last 168 hours CBC:  Recent Labs Lab 06/24/14 2245 06/26/14 0347  WBC 12.4* 7.2  NEUTROABS 10.1*  --   HGB 13.2 12.9*  HCT 39.0 38.5*  MCV 88.6 90.2  PLT 217 181   Cardiac Enzymes:  Recent Labs Lab 06/25/14 0130  TROPONINI <0.30   BNP (last 3 results)  Recent Labs  12/23/13 0936 03/21/14 1551 04/01/14 1431  PROBNP 1247.0* 2003.0* 658.6*   CBG:  Recent Labs Lab 06/24/14 2259 06/25/14 0407 06/25/14 0758 06/25/14 1129 06/25/14 1701  GLUCAP 228* 272* 266* 253* 244*    Recent Results (from the past 240 hour(s))  URINE CULTURE     Status: None   Collection Time    06/24/14 11:20 PM      Result Value Ref Range Status   Specimen Description URINE, CLEAN CATCH   Final   Special Requests Immunocompromised   Final   Culture  Setup Time  Final   Value: 06/25/2014 04:14     Performed at [place]     Final   Value: NO GROWTH     Performed at [facility]   Culture     Final   Value: NO GROWTH     Performed at [facility]   Report Status 06/25/2014 FINAL   Final  MRSA PCR SCREENING     Status: Abnormal   Collection Time    06/25/14  5:34 AM      Result Value Ref Range Status   MRSA by PCR POSITIVE (*) NEGATIVE Final   Comment:            The GeneXpert MRSA Assay (FDA     approved for NASAL specimens     only), is one component of a     comprehensive MRSA colonization     surveillance program. It is not     intended to diagnose MRSA     infection nor to guide or     monitor treatment for     MRSA infections.     CRITICAL RESULT CALLED TO, READ  BACK BY AND VERIFIED WITH:     Bernita Raisin RN 06/25/14 [id number] COSTELLO B     Studies: Ct Abdomen Pelvis Wo Contrast  06/25/2014   CLINICAL DATA:  Abdominal pain. Gross hematuria after urinary catheter change at home 06/24/2014.  EXAM: CT ABDOMEN AND PELVIS WITHOUT CONTRAST  TECHNIQUE: Multidetector CT imaging of the abdomen and pelvis was performed following the standard protocol without IV contrast.  COMPARISON:  None.  FINDINGS: BODY WALL: Unremarkable.  LOWER CHEST: Coronary atherosclerosis. Aortic valve calcifications/sclerosis.  ABDOMEN/PELVIS:  Liver: No focal abnormality.  Biliary: Cholecystectomy.  Pancreas: Unremarkable.  Spleen: Unremarkable.  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  Kidneys and ureters: No hydronephrosis or stone.  Bladder: Gas in the urinary bladder is likely from instrumentation. There is a Foley catheter which is partially visualized, with balloon inflated in the mid anterior urethra.  Reproductive: The prostate gland is either small or surgically absent.  Bowel: Status post gastric bypass. No bowel obstruction or evidence of inflammation. Normal appendix.  Retroperitoneum: No mass or adenopathy.  Peritoneum: No ascites or pneumoperitoneum.  Vascular: No acute abnormality.  OSSEOUS: Bilateral hip osteoarthritis, with joint narrowing advanced on the right. Advanced degenerative disc disease at L4-5, with moderate anterior slip from facet osteoarthritis. There is notable spinal canal stenosis at this level. Vertebral body wedging at the thoracolumbar junction without acute fracture.  Results were called by telephone at the time of interpretation on 06/25/2014 at 3:50 am to Dr. Shanda Howells , who was already aware of these results.  IMPRESSION: 1. Malpositioned Foley catheter with balloon inflated in the anterior urethra. 2. Incidental findings are described above.   Electronically Signed   By: Jorje Guild M.D.   On: 06/25/2014 03:51   Dg Chest 2 View  06/25/2014   CLINICAL  DATA:  Stroke.  Initial encounter  EXAM: CHEST  2 VIEW  COMPARISON:  04/01/2014  FINDINGS: Cardiomegaly which is accentuated by a low lung volumes. Tortuous thoracic aorta which has also been noted previously. There is diffuse interstitial opacity with cephalized blood flow. No evidence for effusion or pneumothorax. Surgical clips in the gastric region.  IMPRESSION: CHF pattern.   Electronically Signed   By: Jorje Guild M.D.   On: 06/25/2014 01:49   Ct Head Wo Contrast  06/25/2014  CLINICAL DATA:  Stroke.  Syncope.  Initial encounter  EXAM: CT HEAD WITHOUT CONTRAST  TECHNIQUE: Contiguous axial images were obtained from the base of the skull through the vertex without intravenous contrast.  COMPARISON:  12/23/2013  FINDINGS: Skull and Sinuses:Negative for fracture or destructive process. The mastoids, middle ears, and imaged paranasal sinuses are clear.  Orbits: No acute abnormality.  Brain: No evidence of acute abnormality, such as acute infarction, hemorrhage, hydrocephalus, or mass lesion/mass effect. Globus pallidus and thalamic calcifications which are symmetric and likely related to calcium dysregulation.  IMPRESSION: No acute intracranial findings.   Electronically Signed   By: Jorje Guild M.D.   On: 06/25/2014 03:31   Mr Jodene Nam Head Wo Contrast  06/25/2014   CLINICAL DATA:  Hematuria and syncope. Stroke. Confusion. The examination had to be discontinued prior to completion due to patient refusal for further imaging.  EXAM: MRI HEAD WITHOUT CONTRAST  MRA HEAD WITHOUT CONTRAST  TECHNIQUE: Multiplanar, multiecho pulse sequences of the brain and surrounding structures were obtained without intravenous contrast. Angiographic images of the head were obtained using MRA technique without contrast.  COMPARISON:  CT head from the same day.  MRI brain 12/25/2013.  FINDINGS: MRI HEAD FINDINGS  No acute infarct, hemorrhage, or mass lesion is present. The ventricles are of normal size. Mild dilated  perivascular spaces are present without evidence for acute or focal infarct. No significant white matter disease is present.  Flow is present in the major intracranial arteries. The globes and orbits are intact. Mild mucosal thickening is present in the ethmoid air cells. The remaining paranasal sinuses and the mastoid air cells are clear.  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 The internal carotid arteries are otherwise within normal limits. The A1 and M1 segments are normal. The MCA bifurcations are within normal limits. A 1 M1 segments are normal. The anterior communicating artery is patent. There is minimal attenuation of distal branch vessels.  The left vertebral artery is the dominant vessel. PICA origins are not well visualized. A echo branches are noted bilaterally. The basilar artery is within normal limits. Both posterior cerebral arteries originate from the basilar tip. There is asymmetric attenuation of the right P2 segment with a moderate focal stenosis. Mild to moderate attenuation of distal PCA branch vessels is noted bilaterally.  IMPRESSION: 1. Normal MRI of the brain for age. 2. 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 3. Focal moderate stenosis of the right P2 segment with mild to moderate attenuation of PCA branch vessels bilaterally.   Electronically Signed   By: Lawrence Santiago M.D.   On: 06/25/2014 14:40   Mr Brain Wo Contrast  06/25/2014   CLINICAL DATA:  Hematuria and syncope. Stroke. Confusion. The examination had to be discontinued prior to completion due to patient refusal for further imaging.  EXAM: MRI HEAD WITHOUT CONTRAST  MRA HEAD WITHOUT CONTRAST  TECHNIQUE: Multiplanar, multiecho pulse sequences of the brain and surrounding structures were obtained without intravenous contrast. Angiographic images of the head were obtained using MRA technique without contrast.   COMPARISON:  CT head from the same day.  MRI brain 12/25/2013.  FINDINGS: MRI HEAD FINDINGS  No acute infarct, hemorrhage, or mass lesion is present. The ventricles are of normal size. Mild dilated perivascular spaces are present without evidence for acute or focal infarct. No significant white matter disease is present.  Flow is present in  the major intracranial arteries. The globes and orbits are intact. Mild mucosal thickening is present in the ethmoid air cells. The remaining paranasal sinuses and the mastoid air cells are clear.  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 The internal carotid arteries are otherwise within normal limits. The A1 and M1 segments are normal. The MCA bifurcations are within normal limits. A 1 M1 segments are normal. The anterior communicating artery is patent. There is minimal attenuation of distal branch vessels.  The left vertebral artery is the dominant vessel. PICA origins are not well visualized. A echo branches are noted bilaterally. The basilar artery is within normal limits. Both posterior cerebral arteries originate from the basilar tip. There is asymmetric attenuation of the right P2 segment with a moderate focal stenosis. Mild to moderate attenuation of distal PCA branch vessels is noted bilaterally.  IMPRESSION: 1. Normal MRI of the brain for age. 2. 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 3. Focal moderate stenosis of the right P2 segment with mild to moderate attenuation of PCA branch vessels bilaterally.   Electronically Signed   By: Lawrence Santiago M.D.   On: 06/25/2014 14:40    Scheduled Meds: .  stroke: mapping our early stages of recovery book   Does not apply Once  . aspirin  300 mg Rectal Daily   Or  . aspirin  325 mg Oral Daily  . Chlorhexidine Gluconate Cloth  6 each Topical Q0600  . ciprofloxacin  400 mg Intravenous Q12H  . furosemide  40 mg  Oral Daily  . insulin aspart  0-9 Units Subcutaneous 6 times per day  . insulin aspart protamine- aspart  15 Units Subcutaneous BID WC  . metoprolol tartrate  25 mg Oral BID  . mupirocin ointment  1 application Nasal BID  . pantoprazole  40 mg Oral Daily   Continuous Infusions:   Active Problems:   Syncope   Hematuria   Abdominal pain    Time spent: 35 minutes.     Niel Hummer A  [facility] Pager [phone]. If 7PM-7AM, please contact night-coverage at [email or web address], password [facility] 06/26/2014, 1:45 PM  LOS: 2 days

## 2014-06-26 NOTE — Evaluation (Signed)
Speech Language Pathology Evaluation Patient Details Name: [name] MRN: [id number] DOB: 1946/09/09 Today's Date: 06/26/2014 Time: [id number] SLP Time Calculation (min): 35 min  Problem List:  Patient Active Problem List   Diagnosis Date Noted  . Hematuria 06/25/2014  . Abdominal pain 06/25/2014  . Syncope 04/01/2014  . Chronic diastolic CHF (congestive heart failure) 04/01/2014  . CHF (congestive heart failure) 03/21/2014  . Acute on chronic diastolic CHF (congestive heart failure) 03/21/2014  . Obesity- BMI 49 12/25/2013  . Physical deconditioning 12/25/2013  . Mental status change 12/23/2013  . Syncope and collapse 12/23/2013  . Chest pain with moderate risk of acute coronary syndrome 12/23/2013  . Chronic indwelling Foley catheter 12/23/2013  . Gait abnormality 03/30/2012  . Vision disturbance 03/28/2012  . Paresthesia 03/12/2012  . CVA (cerebral infarction) 03/11/2012  . Abdominal pain, right lower quadrant 03/11/2012  . Hypokalemia 03/11/2012  . Obstipation 03/11/2012  . Acute gastroenteritis 11/16/2011  . Diarrhea 11/15/2011  . CAD- known RCA disease- medical Rx 11/15/2011  . SOB (shortness of breath) 11/12/2011  . Hyponatremia 11/12/2011  . NSVT (nonsustained ventricular tachycardia) 10/26/2011  . Chest pain 10/19/2011  . Dizziness 10/19/2011  . DM (diabetes mellitus) with complications [id number]  . 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 OSA (obstructive sleep apnea) 10/10/2011  . Diabetic neuropathy, painful 10/10/2011  . Painful bladder spasm 10/10/2011  . Dehydration 10/10/2011  . UTI (lower urinary tract infection) 10/10/2011  . Hyperglycemia without ketosis 10/10/2011  . Generalized headaches 10/10/2011  . Hypertension 10/10/2011   Past Medical History:  Past Medical History  Diagnosis Date  . Obese   . Hypertension   . Diabetes mellitus   . Diabetic diarrhea   . Sleep apnea   . Diabetic neuropathy, painful   . Chronic indwelling  Foley catheter   . Bladder spasms   . Complication of anesthesia   . PONV (postoperative nausea and vomiting)   . Hyperlipidemia   . GERD (gastroesophageal reflux disease)   . Neurogenic bladder   . Stroke 2013   Past Surgical History:  Past Surgical History  Procedure Laterality Date  . Right tkr    . Gastroplasty    . Cholecystectomy    . Total knee arthroplasty     HPI:  This is a 68 y.o. year old male with significant past medical history of multiple medical problems including morbid obesity, limited mobility, chronic indwelling foley 2/2 neurogenic bladder, chronic diastolic heart failure, CAD, IDDM presenting with hematuria, syncope, ? CVA. Pt has an indwelling foley that gets changed monthly. Pt had foley changed by [facility] nursing. States that foley change was traumatic. Had significant hematuria s?p change. EMS was subsequently called on 06/24/14.  Per report, pt's housing was extremely dilapidated to the point EMS could barely get in his house. On arrival to ER on 06/24/14, pt afebrile and hemodynamically stable. Had bloodwork that was WNL apart from WBC 12.4 and K 3.4. UA negative for infection but did show gross hematuria. Pt. stated he "was a different person Thursday" and a lot of his symptoms re: confusion have resolved.  He complains of intermittent word-finding issues within conversations, but this is improving as well. 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   Assessment / Plan / Recommendation Clinical Impression  Pt exhibited intermittent word  finding difficulty at the conversational level only; confrontational tasks and divergent/convergent tasks [facility]; auditory comprehension tasks including following complex directives, reading functional information, and answering various questions  re: orientation/personal information all WFL; writing not assessed during this evaluation; cognition appears intact, but was not fully assessed d/t time constraints; discussed anomia during conversational speech with patient and he agrees to f/u with ST prn with home health SLP if difficulties persist.    SLP Assessment  All further Speech Language Pathology  needs can be addressed in the next venue of care    Follow Up Recommendations  Home health SLP (prn)    Frequency and Duration     n/a   Pertinent Vitals/Pain Pain Assessment: 0-10 Pain Score: 3  Pain Location: groin Pain Descriptors / Indicators: Aching;Burning Pain Intervention(s): Monitored during session   SLP Goals  Potential to Achieve Goals: Good  SLP Evaluation Prior Functioning  Cognitive/Linguistic Baseline: Within functional limits Type of Home: House  Lives With: Alone Available Help at Discharge: Friend(s);Available PRN/intermittently Education: some college education; retired Hospital doctor: Retired   Associate Professor  Overall Cognitive Status: Within Advertising copywriter for tasks assessed Arousal/Alertness: Awake/alert Orientation Level: Oriented X4 Memory: Appears intact Awareness: Appears intact Problem Solving: Appears intact Safety/Judgment: Appears intact    Comprehension  Auditory Comprehension Overall Auditory Comprehension: Appears within functional limits for tasks assessed Reading Comprehension Reading Status: Within functional limits    Expression Expression Primary Mode of Expression: Verbal Verbal Expression Overall Verbal Expression: Appears within functional limits for tasks assessed Initiation: No impairment Level of Generative/Spontaneous Verbalization: Conversation Repetition: No impairment Naming: Impairment Responsive: 76-100% accurate Confrontation: Within functional limits Convergent: 75-100% accurate Divergent: 75-100% accurate Other Naming Comments: intermittent  anomia present with conversational speech Verbal Errors: Aware of errors Pragmatics: No impairment Non-Verbal Means of Communication: Not applicable Written Expression Dominant Hand: Right Written Expression: Not tested   Oral / Motor Oral Motor/Sensory Function Overall Oral Motor/Sensory Function: Appears within functional limits for tasks assessed Motor Speech Overall Motor Speech: Appears within functional limits for tasks assessed Respiration: Within functional limits Phonation: Normal Resonance: Within functional limits Articulation: Within functional limitis Intelligibility: Intelligible Motor Planning: Within functional limits Motor Speech Errors: Not applicable        Oralia Criger,PAT , M.S., CCC-SLP  06/26/2014, 12:43 PM

## 2014-06-26 NOTE — Progress Notes (Signed)
Pt only allowed me to give him 10 units of his 70/30 this am. Only had a [place] sandwich for breakfast,  SSI given.

## 2014-06-27 ENCOUNTER — Other Ambulatory Visit: Payer: Self-pay | Admitting: Cardiology

## 2014-06-27 DIAGNOSIS — I359 Nonrheumatic aortic valve disorder, unspecified: Secondary | ICD-10-CM

## 2014-06-27 DIAGNOSIS — Z9889 Other specified postprocedural states: Secondary | ICD-10-CM

## 2014-06-27 DIAGNOSIS — R55 Syncope and collapse: Secondary | ICD-10-CM

## 2014-06-27 DIAGNOSIS — I5032 Chronic diastolic (congestive) heart failure: Secondary | ICD-10-CM

## 2014-06-27 LAB — BASIC METABOLIC PANEL
Anion gap: 12 (ref 5–15)
BUN: 7 mg/dL (ref 6–23)
CO2: 25 meq/L (ref 19–32)
Calcium: 8.1 mg/dL — ABNORMAL LOW (ref 8.4–10.5)
Chloride: 97 mEq/L (ref 96–112)
Creatinine, Ser: 0.62 mg/dL (ref 0.50–1.35)
GFR calc Af Amer: >90 mL/min (ref >90)
GFR calc non Af Amer: >90 mL/min (ref >90)
GLUCOSE: 222 mg/dL — AB (ref 70–99)
Potassium: 3.5 mEq/L — ABNORMAL LOW (ref 3.7–5.3)
Sodium: 134 mEq/L — ABNORMAL LOW (ref 137–147)

## 2014-06-27 LAB — TROPONIN I: Troponin I: <0.3 ng/mL (ref <0.30)

## 2014-06-27 MED ORDER — POTASSIUM CHLORIDE CRYS ER 20 MEQ PO TBCR
40.0000 meq | EXTENDED_RELEASE_TABLET | Freq: Once | ORAL | Status: AC
  Administered 2014-06-27: 40 meq via ORAL
  Filled 2014-06-27: qty 2

## 2014-06-27 MED ORDER — HEART RATE MONITOR MISC: 1.0000 | Status: DC

## 2014-06-27 NOTE — Progress Notes (Signed)
*  PRELIMINARY RESULTS* Vascular Ultrasound Carotid Duplex (Doppler) has been completed.  Preliminary findings: Bilateral:  1-39% ICA stenosis.  Vertebral artery flow is antegrade.      Landry Mellow, RDMS, RVT  06/27/2014, 10:52 AM

## 2014-06-27 NOTE — Progress Notes (Signed)
Patient's IV infiltrated. He is scheduled to get IV Cipro, however the patient is requesting to not have an IV restarted and to have his Cipro changed to PO. Notified Kathline Magic, NP. Orders to hold IV Cipro until the rounding MD clarifies whether or not the patient still needs Cipro. Lora Havens RN

## 2014-06-27 NOTE — Progress Notes (Signed)
  Echocardiogram 2D Echocardiogram has been performed.  Joelene Millin 06/27/2014, 8:57 AM

## 2014-06-27 NOTE — Progress Notes (Signed)
OT Cancellation Note  Patient Details Name: [name] MRN: [id number] DOB: 12-Jul-1946   Cancelled Treatment:    Reason Eval/Treat Not Completed: OT screened, no needs identified, will sign off - Pt states he has had OT in the past, he has everything he needs, and does not need/want further OT at this time.  Will sign off.   Darlina Rumpf [place], OTR/L [id number]  06/27/2014, 11:23 AM

## 2014-06-27 NOTE — Evaluation (Addendum)
Occupational Therapy Evaluation Patient Details Name: [name] MRN: [id number] DOB: Jan 25, 1946 Today's Date: 06/27/2014    History of Present Illness Pt is 68 y.o. year old male with significant past medical history of multiple medical problems including morbid obesity, limited mobility, chronic indwelling foley due neurogenic bladder, chronic diastolic heart failure, CAD, IDDM.  Pt with syncope episodes in ED and reports of right sided weakness.  No findings on MRI.     Clinical Impression   Pt admitted with above. He demonstrates the below listed deficits and will benefit from continued OT to maximize safety and independence with BADLs.  Pt presents to OT with generalized weakness.   BP seated 143/73; standing 151/90; and at end of session 157/127.  Pt denies dizziness.  He now feels he would benefit from SNF level rehab to help increase his strength and independence before returning home.       Follow Up Recommendations  SNF    Equipment Recommendations  None recommended by OT    Recommendations for Other Services       Precautions / Restrictions Precautions Precautions: Fall      Mobility Bed Mobility Overal bed mobility: Needs Assistance Bed Mobility: Supine to Sit     Supine to sit: Supervision;HOB elevated     General bed mobility comments: Pt reports he sleeps in a recliner at home and does not lie supine   Transfers Overall transfer level: Needs assistance Equipment used: Rolling walker (2 wheeled) Transfers: Sit to/from [facility] Sit to Stand: Min guard Stand pivot transfers: Min assist            Balance Overall balance assessment: Needs assistance Sitting-balance support: Feet supported Sitting balance-Leahy Scale: Good     Standing balance support: Bilateral upper extremity supported Standing balance-Leahy Scale: Fair                              ADL Overall ADL's : Needs  assistance/impaired Eating/Feeding: Independent;Sitting   Grooming: Wash/dry hands;Wash/dry face;Oral care;Brushing hair;Minimal assistance;Standing   Upper Body Bathing: Set up;Sitting   Lower Body Bathing: Minimal assistance;Sit to/from stand   Upper Body Dressing : Set up;Sitting   Lower Body Dressing: Moderate assistance;Sit to/from stand   Toilet Transfer: Minimal assistance;Ambulation;Comfort height toilet;RW   Toileting- Clothing Manipulation and Hygiene: Minimal assistance;Sit to/from stand       Functional mobility during ADLs: Minimal assistance;Rolling walker General ADL Comments: Pt very talkative.  Reports that he has been thinking and he wants to improve his level of functioning and "start living again".  Encouragement provided     Vision                     Perception     Praxis      Pertinent Vitals/Pain Pain Assessment: No/denies pain     Hand Dominance Right   Extremity/Trunk Assessment Upper Extremity Assessment Upper Extremity Assessment: Generalized weakness   Lower Extremity Assessment Lower Extremity Assessment: Defer to PT evaluation       Communication Communication Communication: No difficulties   Cognition Arousal/Alertness: Awake/alert Behavior During Therapy: WFL for tasks assessed/performed Overall Cognitive Status: Within Functional Limits for tasks assessed                     General Comments       Exercises       Shoulder Instructions      Home Living  Family/patient expects to be discharged to:: Private residence Living Arrangements: Alone Available Help at Discharge: Friend(s);Available PRN/intermittently Type of Home: House Home Access: Stairs to enter [employer] of Steps: 1 Entrance Stairs-Rails: Right;Left Home Layout: One level     Bathroom Shower/Tub: Tub/shower unit Shower/tub characteristics: Curtain Biochemist, clinical: Handicapped height Bathroom Accessibility: No   Home  Equipment: Cane - single point;Grab bars - toilet;Grab bars - tub/shower;Electric scooter;Wheelchair - [employer] - 2 wheels;Adaptive equipment Adaptive Equipment: Reacher;Sock aid;Long-handled shoe horn Additional Comments: Pt takes sponge bath at sink. W/c and scooter do not fit inside the house so pt usually uses cane. Pt reports he is too large for his scooter and no longer feels safe using it in the community   Lives With: Alone    Prior Functioning/Environment Level of Independence: Independent with assistive device(s)  Gait / Transfers Assistance Needed: Pt reports he furtniture walks in his home typically ambulating 50' at a time.  he denies falls  ADL's / Homemaking Assistance Needed: Pt reports he has been performing BADLs mod I using AE and with increased effort   Comments: Pt reports he was at SNF ~ 2 mos PTA.  Pt reports he has hand controls on his truck and continues to drive, but often does not go into stores due to decreased mobility     OT Diagnosis: Generalized weakness   OT Problem List: Decreased strength;Decreased activity tolerance;Impaired balance (sitting and/or standing);Obesity;Decreased knowledge of use of DME or AE   OT Treatment/Interventions: Self-care/ADL training;Therapeutic exercise;DME and/or AE instruction;Therapeutic activities;Patient/family education;Balance training    OT Goals(Current goals can be found in the care plan section) Acute Rehab OT Goals Patient Stated Goal: To regain strength and start living  OT Goal Formulation: With patient Time For Goal Achievement: 07/11/14 Potential to Achieve Goals: Good ADL Goals Pt Will Perform Grooming: with supervision;standing Pt Will Perform Lower Body Bathing: with supervision;with adaptive equipment;sit to/from stand Pt Will Perform Lower Body Dressing: with supervision;with adaptive equipment;sit to/from stand Pt Will Transfer to Toilet: with supervision;ambulating;regular height toilet;bedside  commode;grab bars Pt Will Perform Toileting - Clothing Manipulation and hygiene: with supervision;sit to/from stand Pt/caregiver will Perform Home Exercise Program: Increased strength;Right Upper extremity;Left upper extremity;With theraband;With written HEP provided  OT Frequency: Min 2X/week   Barriers to D/C: Decreased caregiver support          Co-evaluation              End of Session Equipment Utilized During Treatment: Rolling walker Nurse Communication: Mobility status  Activity Tolerance: Patient tolerated treatment well Patient left: in bed;with call bell/phone within reach;with bed alarm set (sitting EOB)   Time: [id number] OT Time Calculation (min): 51 min Charges:  OT General Charges $OT Visit: 1 Procedure OT Evaluation $Initial OT Evaluation Tier I: 1 Procedure OT Treatments $Self Care/Home Management : 8-22 mins $Therapeutic Activity: 23-37 mins G-Codes:    Thedford Bunton M 2014-07-21, 6:13 PM

## 2014-06-27 NOTE — Progress Notes (Signed)
[facility] PROGRESS NOTE  [name] [id number] DOB: 01/02/1946 DOA: 06/24/2014 PCP: Irven Shelling, MD  Assessment/Plan: 1-Syncope;  -Echo: Left ventricle: The cavity size was normal. There was mild concentric hypertrophy. Systolic function was normal. The estimated ejection fraction was in the range of 50% to 55%. Images were inadequate for LV wall motion assessment. Aortic valve: Severe thickening and calcification, consistent with sclerosis. Valve mobility was restricted. There was mild stenosis. -Doppler: Preliminary findings: Bilateral: 1-39% ICA stenosis. Vertebral artery flow is antegrade.  -orthostatic vitals; negative -Cardiology will help arrange  holter monitor.   2-Right side weakness; sensory changes; neurology following. MRI negative for acute stroke. Marland Kitchen No further neurology work up.   3-Chest Pain  -prior episodes in past followed by [facility])  -had nuc med study 01/2014-unsure of results  -no active CP currently  -Troponin negative.   4-Hematuria/neurogenic bladder  -traumatic in etiology  -clearing in view of urinary stream from foley  -urology consulted, appreciate evaluation.   5-Abd Pain  -mild-moderate generalized abd pain on exam  -CT abd and pelvis negative.   6-Mild fever leukocytosis.  Follow urine culture. WBC normalized. Urine culture no growth. Will dc cipro. Resolved.  7-LE edema; doppler negative.   Code Status: full code.  Family Communication: care discussed with patient Disposition Plan: patient considering SNF. SW/ CM consulted.    Consultants: Neurology urology  Procedures: Echo doppler  Antibiotics: None  HPI/Subjective: No chest pain . Bleeding from catheter decreasing.  Pain from catether better.  Doesn't like heart healthy diet. Considering SNF.   Objective: Filed Vitals:   06/27/14 0756  BP: 144/65  Pulse: 74  Temp: 97.9 F (36.6 C)  Resp: 18    Intake/Output Summary (Last 24 hours) at  06/27/14 1422 Last data filed at 06/27/14 [id number]  Gross per 24 hour  Intake    200 ml  Output   5225 ml  Net  -5025 ml   Filed Weights   06/25/14 0533 06/26/14 0400 06/27/14 0000  Weight: 162.524 kg (358 lb 4.8 oz) 163.105 kg (359 lb 9.3 oz) 163.211 kg (359 lb 13 oz)    Exam:   General:  Alert in no distress.   Cardiovascular: S 1, S 2 RRR  Respiratory: decreases breath sounds.   Abdomen: obese, no TN  Musculoskeletal: no edema.   Data Reviewed: Basic Metabolic Panel:  Recent Labs Lab 06/24/14 2245 06/25/14 0130 06/26/14 0347 06/27/14 0500  NA 134*  --  134* 134*  K 3.4*  --  3.4* 3.5*  CL 96  --  98 97  CO2 26  --  25 25  GLUCOSE 266*  --  172* 222*  BUN 5*  --  6 7  CREATININE 0.77  --  0.60 0.62  CALCIUM 8.1*  --  8.0* 8.1*  MG  --  1.7  --   --    Liver Function Tests: No results found for this basename: AST, ALT, ALKPHOS, BILITOT, PROT, ALBUMIN,  in the last 168 hours No results found for this basename: LIPASE, AMYLASE,  in the last 168 hours No results found for this basename: AMMONIA,  in the last 168 hours CBC:  Recent Labs Lab 06/24/14 2245 06/26/14 0347  WBC 12.4* 7.2  NEUTROABS 10.1*  --   HGB 13.2 12.9*  HCT 39.0 38.5*  MCV 88.6 90.2  PLT 217 181   Cardiac Enzymes:  Recent Labs Lab 06/25/14 0130 06/26/14 1704 06/26/14 2202 06/27/14 0500  TROPONINI <0.30 <0.30 <0.30 <  0.30   BNP (last 3 results)  Recent Labs  12/23/13 0936 03/21/14 1551 04/01/14 1431  PROBNP 1247.0* 2003.0* 658.6*   CBG:  Recent Labs Lab 06/24/14 2259 06/25/14 0407 06/25/14 0758 06/25/14 1129 06/25/14 1701  GLUCAP 228* 272* 266* 253* 244*    Recent Results (from the past 240 hour(s))  URINE CULTURE     Status: None   Collection Time    06/24/14 11:20 PM      Result Value Ref Range Status   Specimen Description URINE, CLEAN CATCH   Final   Special Requests Immunocompromised   Final   Culture  Setup Time     Final   Value: 06/25/2014 04:14      Performed at [place]     Final   Value: NO GROWTH     Performed at [facility]   Culture     Final   Value: NO GROWTH     Performed at [facility]   Report Status 06/25/2014 FINAL   Final  MRSA PCR SCREENING     Status: Abnormal   Collection Time    06/25/14  5:34 AM      Result Value Ref Range Status   MRSA by PCR POSITIVE (*) NEGATIVE Final   Comment:            The GeneXpert MRSA Assay (FDA     approved for NASAL specimens     only), is one component of a     comprehensive MRSA colonization     surveillance program. It is not     intended to diagnose MRSA     infection nor to guide or     monitor treatment for     MRSA infections.     CRITICAL RESULT CALLED TO, READ BACK BY AND VERIFIED WITH:     Bernita Raisin RN 06/25/14 [id number] COSTELLO B     Studies: No results found.  Scheduled Meds: .  stroke: mapping our early stages of recovery book   Does not apply Once  . aspirin  300 mg Rectal Daily   Or  . aspirin  325 mg Oral Daily  . Chlorhexidine Gluconate Cloth  6 each Topical Q0600  . furosemide  40 mg Oral Daily  . insulin aspart  0-9 Units Subcutaneous 6 times per day  . insulin aspart protamine- aspart  15 Units Subcutaneous BID WC  . metoprolol tartrate  25 mg Oral BID  . mupirocin ointment  1 application Nasal BID  . pantoprazole  40 mg Oral Daily   Continuous Infusions:   Active Problems:   Syncope   Hematuria   Abdominal pain    Time spent: 25 minutes.     Niel Hummer A  [facility] Pager [phone]. If 7PM-7AM, please contact night-coverage at [email or web address], password [facility] 06/27/2014, 2:22 PM  LOS: 3 days

## 2014-06-28 LAB — GLUCOSE, CAPILLARY
GLUCOSE-CAPILLARY: 232 mg/dL — AB (ref 70–99)
GLUCOSE-CAPILLARY: 263 mg/dL — AB (ref 70–99)
GLUCOSE-CAPILLARY: 198 mg/dL — AB (ref 70–99)
GLUCOSE-CAPILLARY: 208 mg/dL — AB (ref 70–99)
Glucose-Capillary: 169 mg/dL — ABNORMAL HIGH (ref 70–99)
Glucose-Capillary: 165 mg/dL — ABNORMAL HIGH (ref 70–99)
Glucose-Capillary: 224 mg/dL — ABNORMAL HIGH (ref 70–99)
Glucose-Capillary: 210 mg/dL — ABNORMAL HIGH (ref 70–99)
Glucose-Capillary: 241 mg/dL — ABNORMAL HIGH (ref 70–99)
Glucose-Capillary: 261 mg/dL — ABNORMAL HIGH (ref 70–99)
Glucose-Capillary: 281 mg/dL — ABNORMAL HIGH (ref 70–99)
Glucose-Capillary: 219 mg/dL — ABNORMAL HIGH (ref 70–99)
Glucose-Capillary: 223 mg/dL — ABNORMAL HIGH (ref 70–99)

## 2014-06-28 LAB — BASIC METABOLIC PANEL
ANION GAP: 12 (ref 5–15)
BUN: 8 mg/dL (ref 6–23)
CHLORIDE: 97 meq/L (ref 96–112)
CO2: 27 mEq/L (ref 19–32)
Calcium: 8.2 mg/dL — ABNORMAL LOW (ref 8.4–10.5)
Creatinine, Ser: 0.74 mg/dL (ref 0.50–1.35)
Glucose, Bld: 204 mg/dL — ABNORMAL HIGH (ref 70–99)
POTASSIUM: 3.9 meq/L (ref 3.7–5.3)
SODIUM: 136 meq/L — AB (ref 137–147)

## 2014-06-28 MED ORDER — INSULIN ASPART PROT & ASPART (70-30 MIX) 100 UNIT/ML ~~LOC~~ SUSP
20.0000 [IU] | Freq: Two times a day (BID) | SUBCUTANEOUS | Status: DC
  Administered 2014-06-28 – 2014-06-29 (×2): 20 [IU] via SUBCUTANEOUS
  Filled 2014-06-28: qty 10

## 2014-06-28 MED ORDER — LIDOCAINE HCL 2 % EX GEL
1.0000 "application " | CUTANEOUS | Status: AC
  Administered 2014-06-28: 1 via URETHRAL
  Filled 2014-06-28: qty 5

## 2014-06-28 MED ORDER — INSULIN ASPART 100 UNIT/ML ~~LOC~~ SOLN
0.0000 [IU] | Freq: Three times a day (TID) | SUBCUTANEOUS | Status: DC
  Administered 2014-06-29: 2 [IU] via SUBCUTANEOUS
  Administered 2014-06-29: 5 [IU] via SUBCUTANEOUS

## 2014-06-28 NOTE — Clinical Social Work Placement (Addendum)
Clinical Social Work Department CLINICAL SOCIAL WORK PLACEMENT NOTE 06/28/2014  Patient:  [name], [name]  Account Number:  [id number] Admit date:  06/24/2014  Clinical Social Worker:  Daiva Huge  Date/time:  06/28/2014 03:32 PM  Clinical Social Work is seeking post-discharge placement for this patient at the following level of care:   SKILLED NURSING   (*[name] will update this form in Epic as items are completed)   06/28/2014  Patient/family provided with [facility] Department of Clinical Social Work's list of facilities offering this level of care within the geographic area requested by the patient (or if unable, by the patient's family).  06/28/2014  Patient/family informed of their freedom to choose among providers that offer the needed level of care, that participate in Medicare, Medicaid or managed care program needed by the patient, have an available bed and are willing to accept the patient.  06/28/2014  Patient/family informed of [facility]' ownership interest in [facility], as well as of the fact that they are under no obligation to receive care at this facility.  PASARR submitted to EDS on 06/28/2014 PASARR number received on 06/28/2014  FL2 transmitted to all facilities in geographic area requested by pt/family on  06/28/2014 FL2 transmitted to all facilities within larger geographic area on   Patient informed that his/her managed care company has contracts with or will negotiate with  certain facilities, including the following:     Patient/family informed of bed offers received:  06/29/2014 Patient chooses bed at [facility] Physician recommends and patient chooses bed at    Patient to be transferred to  [facility]  06/29/2014 Patient to be transferred to facility by [facility] Patient and family notified of transfer on 06/29/2014 Name of family member notified:  Pt declined  The following physician  request were entered in Epic:   Additional Comments: Eduard Clos, MSW, [employer]

## 2014-06-28 NOTE — Progress Notes (Signed)
PT Cancellation Note  Patient Details Name: [name] MRN: [id number] DOB: [birth year]   Cancelled Treatment:    Reason Eval/Treat Not Completed: Patient declined, no reason specified Pt declined participating in therapy due to being sore from foley catheter almost getting pulled out earlier. Declining all mobility. Will follow up on 10/20 or when appropriate.   Candy Sledge A 06/28/2014, 12:46 PM Candy Sledge, [place], DPT [phone]

## 2014-06-28 NOTE — Progress Notes (Signed)
[facility] PROGRESS NOTE  [name] Ambs [id number] DOB: 06/09/1946 DOA: 06/24/2014 PCP: Irven Shelling, MD  Assessment/Plan: 1-Syncope;  -Echo: Left ventricle: The cavity size was normal. There was mild concentric hypertrophy. Systolic function was normal. The estimated ejection fraction was in the range of 50% to 55%. Images were inadequate for LV wall motion assessment. Aortic valve: Severe thickening and calcification, consistent with sclerosis. Valve mobility was restricted. There was mild stenosis. -Doppler: Preliminary findings: Bilateral: 1-39% ICA stenosis. Vertebral artery flow is antegrade.  -orthostatic vitals; negative -Cardiology will help arrange  holter monitor.   2-Right side weakness; sensory changes; neurology following. MRI negative for acute stroke. Marland Kitchen No further neurology work up.   3-Chest Pain  -prior episodes in past followed by [facility])  -had nuc med study 01/2014-unsure of results  -no active CP currently  -Troponin negative.   4-Hematuria/neurogenic bladder  Patient remove foley catheter accidentally last night. Urology was contacted by night coverage. Plan to re insert foley.  -traumatic in etiology  -clearing in view of urinary stream from foley  -urology consulted, appreciate evaluation.   5-Abd Pain  -mild-moderate generalized abd pain on exam  -CT abd and pelvis negative.   6-Mild fever leukocytosis.  Follow urine culture. WBC normalized. Urine culture no growth. Will dc cipro. Resolved.  7-LE edema; doppler negative.   Code Status: full code.  Family Communication: care discussed with patient Disposition Plan: patient considering SNF. SW/ CM consulted.    Consultants: Neurology urology  Procedures: Echo doppler  Antibiotics: None  HPI/Subjective: He remove foley catheter accidentally, turning in bed.   Objective: Filed Vitals:   06/28/14 1610  BP: 145/95  Pulse: 65  Temp:   Resp: 20    Intake/Output  Summary (Last 24 hours) at 06/28/14 1614 Last data filed at 06/28/14 0858  Gross per 24 hour  Intake    260 ml  Output   2650 ml  Net  -2390 ml   Filed Weights   06/26/14 0400 06/27/14 0000 06/28/14 0453  Weight: 163.105 kg (359 lb 9.3 oz) 163.211 kg (359 lb 13 oz) 159.712 kg (352 lb 1.6 oz)    Exam:   General:  Alert in no distress.   Cardiovascular: S 1, S 2 RRR  Respiratory: decreases breath sounds.   Abdomen: obese, no TN  Musculoskeletal: no edema.   Data Reviewed: Basic Metabolic Panel:  Recent Labs Lab 06/24/14 2245 06/25/14 0130 06/26/14 0347 06/27/14 0500  NA 134*  --  134* 134*  K 3.4*  --  3.4* 3.5*  CL 96  --  98 97  CO2 26  --  25 25  GLUCOSE 266*  --  172* 222*  BUN 5*  --  6 7  CREATININE 0.77  --  0.60 0.62  CALCIUM 8.1*  --  8.0* 8.1*  MG  --  1.7  --   --    Liver Function Tests: No results found for this basename: AST, ALT, ALKPHOS, BILITOT, PROT, ALBUMIN,  in the last 168 hours No results found for this basename: LIPASE, AMYLASE,  in the last 168 hours No results found for this basename: AMMONIA,  in the last 168 hours CBC:  Recent Labs Lab 06/24/14 2245 06/26/14 0347  WBC 12.4* 7.2  NEUTROABS 10.1*  --   HGB 13.2 12.9*  HCT 39.0 38.5*  MCV 88.6 90.2  PLT 217 181   Cardiac Enzymes:  Recent Labs Lab 06/25/14 0130 06/26/14 1704 06/26/14 2202 06/27/14 0500  TROPONINI <  0.30 <0.30 <0.30 <0.30   BNP (last 3 results)  Recent Labs  12/23/13 0936 03/21/14 1551 04/01/14 1431  PROBNP 1247.0* 2003.0* 658.6*   CBG:  Recent Labs Lab 06/27/14 1120 06/27/14 1704 06/27/14 2217 06/28/14 0743 06/28/14 1149  GLUCAP 261* 281* 219* 223* 263*    Recent Results (from the past 240 hour(s))  URINE CULTURE     Status: None   Collection Time    06/24/14 11:20 PM      Result Value Ref Range Status   Specimen Description URINE, CLEAN CATCH   Final   Special Requests Immunocompromised   Final   Culture  Setup Time     Final    Value: 06/25/2014 04:14     Performed at [facility]     Final   Value: NO GROWTH     Performed at [facility]   Culture     Final   Value: NO GROWTH     Performed at [facility]   Report Status 06/25/2014 FINAL   Final  MRSA PCR SCREENING     Status: Abnormal   Collection Time    06/25/14  5:34 AM      Result Value Ref Range Status   MRSA by PCR POSITIVE (*) NEGATIVE Final   Comment:            The GeneXpert MRSA Assay (FDA     approved for NASAL specimens     only), is one component of a     comprehensive MRSA colonization     surveillance program. It is not     intended to diagnose MRSA     infection nor to guide or     monitor treatment for     MRSA infections.     CRITICAL RESULT CALLED TO, READ BACK BY AND VERIFIED WITH:     Bernita Raisin RN 06/25/14 [id number] COSTELLO B     Studies: No results found.  Scheduled Meds: .  stroke: mapping our early stages of recovery book   Does not apply Once  . aspirin  300 mg Rectal Daily   Or  . aspirin  325 mg Oral Daily  . Chlorhexidine Gluconate Cloth  6 each Topical Q0600  . furosemide  40 mg Oral Daily  . insulin aspart  0-9 Units Subcutaneous 6 times per day  . insulin aspart protamine- aspart  20 Units Subcutaneous BID WC  . metoprolol tartrate  25 mg Oral BID  . mupirocin ointment  1 application Nasal BID  . pantoprazole  40 mg Oral Daily   Continuous Infusions:   Active Problems:   Syncope   Hematuria   Abdominal pain    Time spent: 25 minutes.     Niel Hummer A  [facility] Pager [phone]. If 7PM-7AM, please contact night-coverage at [email or web address], password [facility] 06/28/2014, 4:14 PM  LOS: 4 days

## 2014-06-28 NOTE — Progress Notes (Signed)
Called into patient's room after he accidentally kicked his foley catheter tubing. He was concerned because there was a lot of blood in the tubing. The balloon was checked, which was in place. At 6:30 the patient complained that he felt a lot of pressure and was unable to void. After releasing the balloon he felt immediate relief. The patient persisted that the foley catheter be removed and replaced with another 35F. Foley catheter was removed. Two large clots passed. Urology called. New orders to replace the foley catheter with a 35F Coude catheter. The 35F Coude catheter was ordered from [facility]. Will continue to monitor. Lora Havens RN

## 2014-06-28 NOTE — Clinical Social Work Psychosocial (Signed)
Clinical Social Work Department BRIEF PSYCHOSOCIAL ASSESSMENT 06/28/2014  Patient:  [name], [name]     Account Number:  [id number]     Admit date:  06/24/2014  Clinical Social Worker:  Daiva Huge  Date/Time:  06/28/2014 02:56 PM  Referred by:  Physician  Date Referred:  06/28/2014 Referred for  SNF Placement   Other Referral:   Interview type:  Patient Other interview type:    PSYCHOSOCIAL DATA Living Status:  ALONE Admitted from facility:   Level of care:   Primary support name:  GIRLFRIEND Primary support relationship to patient:  FRIEND Degree of support available:   GOOD    CURRENT CONCERNS Current Concerns  Post-Acute Placement   Other Concerns:    SOCIAL WORK ASSESSMENT / PLAN [name] met with patient to discuss possible SNF rehab at d/c- patient agrees to this- stating, "the Doctor talked with me about it and I am ok with going." [name] discussed SNF process- patient reports that he has been to [place] in the past- he is agreeable to pursuing this again. Patient advised of plans for SNF search in [facility] and for [facility] as well.   Assessment/plan status:  Other - See comment Other assessment/ plan:   FL2 and PASARR for SNF search   Information/referral to community resources:   SNF list    PATIENT'S/FAMILY'S RESPONSE TO PLAN OF CARE: Patient agreeable to plans for SNF search- [name] will pursue bed for him at [facility] since this is his prefence.    Eduard Clos, MSW, [place], [place]

## 2014-06-28 NOTE — Progress Notes (Signed)
Inpatient Diabetes Program Recommendations  AACE/ADA: New Consensus Statement on Inpatient Glycemic Control (2013)  Target Ranges:  Prepandial:   less than 140 mg/dL      Peak postprandial:   less than 180 mg/dL (1-2 hours)      Critically ill patients:  140 - 180 mg/dL   Pt eating 100% of diet.  Glucose running consistently high in 200's. Please consider the following:  Inpatient Diabetes Program Recommendations Insulin - Basal: Pt takes 70/30 units at home, 55 units bid. Pt on 15 units here-Please increase to at least 1/2 her home dose to 25 units bid.  Thank you, Rosita Kea, RN, CNS, Diabetes Coordinator [phone])

## 2014-06-29 DIAGNOSIS — E1140 Type 2 diabetes mellitus with diabetic neuropathy, unspecified: Secondary | ICD-10-CM | POA: Diagnosis not present

## 2014-06-29 DIAGNOSIS — N319 Neuromuscular dysfunction of bladder, unspecified: Secondary | ICD-10-CM | POA: Diagnosis not present

## 2014-06-29 DIAGNOSIS — R319 Hematuria, unspecified: Secondary | ICD-10-CM | POA: Diagnosis not present

## 2014-06-29 DIAGNOSIS — E118 Type 2 diabetes mellitus with unspecified complications: Secondary | ICD-10-CM | POA: Diagnosis not present

## 2014-06-29 DIAGNOSIS — E1100 Type 2 diabetes mellitus with hyperosmolarity without nonketotic hyperglycemic-hyperosmolar coma (NKHHC): Secondary | ICD-10-CM | POA: Diagnosis not present

## 2014-06-29 DIAGNOSIS — Z9889 Other specified postprocedural states: Secondary | ICD-10-CM | POA: Diagnosis not present

## 2014-06-29 DIAGNOSIS — I259 Chronic ischemic heart disease, unspecified: Secondary | ICD-10-CM | POA: Diagnosis not present

## 2014-06-29 DIAGNOSIS — E1165 Type 2 diabetes mellitus with hyperglycemia: Secondary | ICD-10-CM | POA: Diagnosis not present

## 2014-06-29 DIAGNOSIS — I5032 Chronic diastolic (congestive) heart failure: Secondary | ICD-10-CM | POA: Diagnosis not present

## 2014-06-29 DIAGNOSIS — K219 Gastro-esophageal reflux disease without esophagitis: Secondary | ICD-10-CM | POA: Diagnosis not present

## 2014-06-29 DIAGNOSIS — R531 Weakness: Secondary | ICD-10-CM | POA: Diagnosis not present

## 2014-06-29 DIAGNOSIS — R55 Syncope and collapse: Secondary | ICD-10-CM | POA: Diagnosis not present

## 2014-06-29 DIAGNOSIS — M6281 Muscle weakness (generalized): Secondary | ICD-10-CM | POA: Diagnosis not present

## 2014-06-29 DIAGNOSIS — E669 Obesity, unspecified: Secondary | ICD-10-CM | POA: Diagnosis not present

## 2014-06-29 LAB — GLUCOSE, CAPILLARY
GLUCOSE-CAPILLARY: 186 mg/dL — AB (ref 70–99)
GLUCOSE-CAPILLARY: 260 mg/dL — AB (ref 70–99)

## 2014-06-29 LAB — VITAMIN B1: Vitamin B1 (Thiamine): 9 nmol/L (ref 8–30)

## 2014-06-29 MED ORDER — INSULIN ASPART PROT & ASPART (70-30 MIX) 100 UNIT/ML ~~LOC~~ SUSP: 20.0000 [IU] | Freq: Two times a day (BID) | SUBCUTANEOUS | Status: DC

## 2014-06-29 MED ORDER — HYDROCODONE-ACETAMINOPHEN 5-325 MG PO TABS: 1.0000 | ORAL_TABLET | ORAL | Status: DC | PRN

## 2014-06-29 NOTE — Discharge Summary (Signed)
Physician Discharge Summary  [name] [id number] DOB: Jan 28, 1946 DOA: 06/24/2014  PCP: Irven Shelling, MD  Admit date: 06/24/2014 Discharge date: 06/29/2014  Time spent: 35 minutes  Recommendations for Outpatient Follow-up:  1. Needs to follow up with urology due to chronic foley catheter.  2. Needs follow up with Cardio for holter monitor.   Discharge Diagnoses:  Active Problems:   Syncope   Hematuria   Abdominal pain secondary to malposition catheter.    Right side weakness; sensory changes; MRI negative for stroke.   Discharge Condition: Stable.   Diet recommendation: Heart Healthy  Filed Weights   06/27/14 0000 06/28/14 0453 06/29/14 0442  Weight: 163.211 kg (359 lb 13 oz) 159.712 kg (352 lb 1.6 oz) 159.575 kg (351 lb 12.8 oz)    History of present illness:  History of Present Illness:This is a 68 y.o. year old male with significant past medical history of multiple medical problems including morbid obesity, limited mobility, chronic indwelling foley 2/2 neurogenic bladder, chronic diastolic heart failure, CAD, IDDM presenting with hematuria, syncope, ? CVA. Pt has an indwelling foley that gets changed monthly. Pt had foley changed by [facility] nursing. States that foley change was traumatic. Had significant hematuria s?p change. EMS was subsequently called. Per report, pt's housing was extremely dilapidated to the point EMS could barely get in his house. On arrival to ER, pt afebrile and hemodynamically stable. Had bloodwork that was WNL apart from WBC 12.4 and K 3.4. UA negative for infection but did show gross hematuria. EKG Sinus rhythm, Ventricular bigeminy,Prolonged PR interval- relatively unchanged from most recent apart from bigeminy  Initial plan was for pt to go home. However, upon ambulation, pt had 4-5 episodes of syncope and confusion per nursing. Pt does not have any recollection of incident. In further discussion with pt, he states that he has had some  worsening R sided weakness and numbness that is fairly new from baseline (unsure of duration). Has also had some mild central chest pressure over past 24 hours. No active CP currently. Denies any confusion or dyspnea.    Hospital Course:  1-Syncope;  -Echo: Left ventricle: The cavity size was normal. There was mild concentric hypertrophy. Systolic function was normal. The estimated ejection fraction was in the range of 50% to 55%. Images were inadequate for LV wall motion assessment. Aortic valve: Severe thickening and calcification, consistent with sclerosis. Valve mobility was restricted. There was mild stenosis.  -Doppler: Preliminary findings: Bilateral: 1-39% ICA stenosis. Vertebral artery flow is antegrade.  -orthostatic vitals; negative  -Cardiology will help arrange holter monitor.   2-Right side weakness; sensory changes; neurology following. MRI negative for acute stroke. Marland Kitchen No further neurology work up.   3-Chest Pain  -prior episodes in past followed by Cards [facility] Porch)  -had nuc med study 01/2014-unsure of results  -no active CP currently  -Troponin negative.   4-Hematuria/neurogenic bladder  Patient remove foley catheter accidentally last night. Urology was contacted by night coverage. Plan to re insert foley.  -traumatic in etiology  -clearing in view of urinary stream from foley  -urology consulted, appreciate evaluation. Needs outpatient follow up.   5-Abd Pain  -mild-moderate generalized abd pain on exam  -CT abd and pelvis negative.   6-Mild fever leukocytosis. Follow urine culture. WBC normalized. Urine culture no growth. Will dc cipro. Resolved.  7-LE edema; doppler negative.    Procedures:  ECHO;   Doppler;   Consultations:  Urology  Discharge Exam: Filed Vitals:   06/29/14 0830  BP:  144/63  Pulse: 78  Temp: 98 F (36.7 C)  Resp: 18    General: No distress.  Cardiovascular: S 1, S 2 RRR Respiratory: CTA  Discharge Instructions You were cared  for by a hospitalist during your hospital stay. If you have any questions about your discharge medications or the care you received while you were in the hospital after you are discharged, you can call the unit and asked to speak with the hospitalist on call if the hospitalist that took care of you is not available. Once you are discharged, your primary care physician will handle any further medical issues. Please note that NO REFILLS for any discharge medications will be authorized once you are discharged, as it is imperative that you return to your primary care physician (or establish a relationship with a primary care physician if you do not have one) for your aftercare needs so that they can reassess your need for medications and monitor your lab values.  Discharge Instructions   Diet - low sodium heart healthy    Complete by:  As directed      Increase activity slowly    Complete by:  As directed           Current Discharge Medication List    CONTINUE these medications which have CHANGED   Details  HYDROcodone-acetaminophen (NORCO/VICODIN) 5-325 MG per tablet Take 1 tablet by mouth every 4 (four) hours as needed for severe pain. Qty: 15 tablet, Refills: 0    insulin aspart protamine- aspart (NOVOLOG MIX 70/30) (70-30) 100 UNIT/ML injection Inject 0.2 mLs (20 Units total) into the skin 2 (two) times daily with a meal. Qty: 10 mL, Refills: 11      CONTINUE these medications which have NOT CHANGED   Details  ammonium lactate (AMLACTIN) 12 % cream Apply 1 g topically daily as needed for dry skin.     aspirin EC 81 MG tablet Take 81 mg by mouth every morning.     diphenoxylate-atropine (LOMOTIL) 2.5-0.025 MG per tablet Take 1 tablet by mouth 3 (three) times daily as needed. For diarrhea    fexofenadine (ALLEGRA) 180 MG tablet Take 180 mg by mouth daily.    furosemide (LASIX) 40 MG tablet Take 1 tablet (40 mg total) by mouth daily. Qty: 30 tablet, Refills: 11    metoprolol tartrate  (LOPRESSOR) 25 MG tablet Take 1 tablet (25 mg total) by mouth 2 (two) times daily. Qty: 60 tablet, Refills: 11    nitroGLYCERIN (NITROSTAT) 0.4 MG SL tablet Place 0.4 mg under the tongue every 5 (five) minutes as needed for chest pain.    omeprazole (PRILOSEC) 20 MG capsule Take 20 mg by mouth daily.    oxybutynin (DITROPAN) 5 MG tablet Take 5 mg by mouth 3 (three) times daily as needed for bladder spasms.    Misc. Devices (HEART RATE MONITOR) MISC 1 Device by Does not apply route as directed. Qty: 1 each, Refills: 0      STOP taking these medications     loperamide (IMODIUM A-D) 2 MG tablet        Allergies  Allergen Reactions  . Ace Inhibitors Swelling  . Lipitor [Atorvastatin Calcium] Swelling  . Metformin And Related Swelling  . Cefadroxil Hives  . Cephalexin Hives  . Methocarbamol Other (See Comments)    Feels like is in "rocky boat"  . Morphine And Related Other (See Comments)    Sweating, feels like is "in rocky boat."  . Robaxin [Methocarbamol] Other (See  Comments)    Feels like he is shaky      The results of significant diagnostics from this hospitalization (including imaging, microbiology, ancillary and laboratory) are listed below for reference.    Significant Diagnostic Studies: Ct Abdomen Pelvis Wo Contrast  06/25/2014   CLINICAL DATA:  Abdominal pain. Gross hematuria after urinary catheter change at home 06/24/2014.  EXAM: CT ABDOMEN AND PELVIS WITHOUT CONTRAST  TECHNIQUE: Multidetector CT imaging of the abdomen and pelvis was performed following the standard protocol without IV contrast.  COMPARISON:  None.  FINDINGS: BODY WALL: Unremarkable.  LOWER CHEST: Coronary atherosclerosis. Aortic valve calcifications/sclerosis.  ABDOMEN/PELVIS:  Liver: No focal abnormality.  Biliary: Cholecystectomy.  Pancreas: Unremarkable.  Spleen: Unremarkable.  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  Kidneys and ureters: No hydronephrosis or stone.  Bladder: Gas in the urinary  bladder is likely from instrumentation. There is a Foley catheter which is partially visualized, with balloon inflated in the mid anterior urethra.  Reproductive: The prostate gland is either small or surgically absent.  Bowel: Status post gastric bypass. No bowel obstruction or evidence of inflammation. Normal appendix.  Retroperitoneum: No mass or adenopathy.  Peritoneum: No ascites or pneumoperitoneum.  Vascular: No acute abnormality.  OSSEOUS: Bilateral hip osteoarthritis, with joint narrowing advanced on the right. Advanced degenerative disc disease at L4-5, with moderate anterior slip from facet osteoarthritis. There is notable spinal canal stenosis at this level. Vertebral body wedging at the thoracolumbar junction without acute fracture.  Results were called by telephone at the time of interpretation on 06/25/2014 at 3:50 am to Dr. Shanda Howells , who was already aware of these results.  IMPRESSION: 1. Malpositioned Foley catheter with balloon inflated in the anterior urethra. 2. Incidental findings are described above.   Electronically Signed   By: Jorje Guild M.D.   On: 06/25/2014 03:51   Dg Chest 2 View  06/25/2014   CLINICAL DATA:  Stroke.  Initial encounter  EXAM: CHEST  2 VIEW  COMPARISON:  04/01/2014  FINDINGS: Cardiomegaly which is accentuated by a low lung volumes. Tortuous thoracic aorta which has also been noted previously. There is diffuse interstitial opacity with cephalized blood flow. No evidence for effusion or pneumothorax. Surgical clips in the gastric region.  IMPRESSION: CHF pattern.   Electronically Signed   By: Jorje Guild M.D.   On: 06/25/2014 01:49   Ct Head Wo Contrast  06/25/2014   CLINICAL DATA:  Stroke.  Syncope.  Initial encounter  EXAM: CT HEAD WITHOUT CONTRAST  TECHNIQUE: Contiguous axial images were obtained from the base of the skull through the vertex without intravenous contrast.  COMPARISON:  12/23/2013  FINDINGS: Skull and Sinuses:Negative for fracture or  destructive process. The mastoids, middle ears, and imaged paranasal sinuses are clear.  Orbits: No acute abnormality.  Brain: No evidence of acute abnormality, such as acute infarction, hemorrhage, hydrocephalus, or mass lesion/mass effect. Globus pallidus and thalamic calcifications which are symmetric and likely related to calcium dysregulation.  IMPRESSION: No acute intracranial findings.   Electronically Signed   By: Jorje Guild M.D.   On: 06/25/2014 03:31   Mr Jodene Nam Head Wo Contrast  06/25/2014   CLINICAL DATA:  Hematuria and syncope. Stroke. Confusion. The examination had to be discontinued prior to completion due to patient refusal for further imaging.  EXAM: MRI HEAD WITHOUT CONTRAST  MRA HEAD WITHOUT CONTRAST  TECHNIQUE: Multiplanar, multiecho pulse sequences of the brain and surrounding structures were obtained without intravenous contrast. Angiographic images of the  head were obtained using MRA technique without contrast.  COMPARISON:  CT head from the same day.  MRI brain 12/25/2013.  FINDINGS: MRI HEAD FINDINGS  No acute infarct, hemorrhage, or mass lesion is present. The ventricles are of normal size. Mild dilated perivascular spaces are present without evidence for acute or focal infarct. No significant white matter disease is present.  Flow is present in the major intracranial arteries. The globes and orbits are intact. Mild mucosal thickening is present in the ethmoid air cells. The remaining paranasal sinuses and the mastoid air cells are clear.  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 The internal carotid arteries are otherwise within normal limits. The A1 and M1 segments are normal. The MCA bifurcations are within normal limits. A 1 M1 segments are normal. The anterior communicating artery is patent. There is minimal attenuation of distal branch vessels.  The left vertebral artery is the dominant  vessel. PICA origins are not well visualized. A echo branches are noted bilaterally. The basilar artery is within normal limits. Both posterior cerebral arteries originate from the basilar tip. There is asymmetric attenuation of the right P2 segment with a moderate focal stenosis. Mild to moderate attenuation of distal PCA branch vessels is noted bilaterally.  IMPRESSION: 1. Normal MRI of the brain for age. 2. 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 3. Focal moderate stenosis of the right P2 segment with mild to moderate attenuation of PCA branch vessels bilaterally.   Electronically Signed   By: Lawrence Santiago M.D.   On: 06/25/2014 14:40   Mr Brain Wo Contrast  06/25/2014   CLINICAL DATA:  Hematuria and syncope. Stroke. Confusion. The examination had to be discontinued prior to completion due to patient refusal for further imaging.  EXAM: MRI HEAD WITHOUT CONTRAST  MRA HEAD WITHOUT CONTRAST  TECHNIQUE: Multiplanar, multiecho pulse sequences of the brain and surrounding structures were obtained without intravenous contrast. Angiographic images of the head were obtained using MRA technique without contrast.  COMPARISON:  CT head from the same day.  MRI brain 12/25/2013.  FINDINGS: MRI HEAD FINDINGS  No acute infarct, hemorrhage, or mass lesion is present. The ventricles are of normal size. Mild dilated perivascular spaces are present without evidence for acute or focal infarct. No significant white matter disease is present.  Flow is present in the major intracranial arteries. The globes and orbits are intact. Mild mucosal thickening is present in the ethmoid air cells. The remaining paranasal sinuses and the mastoid air cells are clear.  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 The internal carotid arteries are otherwise within normal limits. The A1 and M1 segments are normal. The MCA bifurcations are  within normal limits. A 1 M1 segments are normal. The anterior communicating artery is patent. There is minimal attenuation of distal branch vessels.  The left vertebral artery is the dominant vessel. PICA origins are not well visualized. A echo branches are noted bilaterally. The basilar artery is within normal limits. Both posterior cerebral arteries originate from the basilar tip. There is asymmetric attenuation of the right P2 segment with a moderate focal stenosis. Mild to moderate attenuation of distal PCA branch vessels is noted bilaterally.  IMPRESSION: 1. Normal MRI of the brain for age. 2. 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 3. Focal moderate  stenosis of the right P2 segment with mild to moderate attenuation of PCA branch vessels bilaterally.   Electronically Signed   By: Lawrence Santiago M.D.   On: 06/25/2014 14:40   Mr Attempted Daymon Larsen Report  06/25/2014   This examination belongs to an outside facility and is stored  here for comparison purposes only.  Contact the originating outside  institution for any associated report or interpretation.   Microbiology: Recent Results (from the past 240 hour(s))  URINE CULTURE     Status: None   Collection Time    06/24/14 11:20 PM      Result Value Ref Range Status   Specimen Description URINE, CLEAN CATCH   Final   Special Requests Immunocompromised   Final   Culture  Setup Time     Final   Value: 06/25/2014 04:14     Performed at [facility]     Final   Value: NO GROWTH     Performed at [facility]   Culture     Final   Value: NO GROWTH     Performed at [facility]   Report Status 06/25/2014 FINAL   Final  MRSA PCR SCREENING     Status: Abnormal   Collection Time    06/25/14  5:34 AM      Result Value Ref Range Status   MRSA by PCR POSITIVE (*) NEGATIVE Final   Comment:            The GeneXpert MRSA Assay (FDA     approved for NASAL specimens     only), is one  component of a     comprehensive MRSA colonization     surveillance program. It is not     intended to diagnose MRSA     infection nor to guide or     monitor treatment for     MRSA infections.     CRITICAL RESULT CALLED TO, READ BACK BY AND VERIFIED WITH:     Bernita Raisin RN 06/25/14 [id number] COSTELLO B     Labs: Basic Metabolic Panel:  Recent Labs Lab 06/24/14 2245 06/25/14 0130 06/26/14 0347 06/27/14 0500 06/28/14 1851  NA 134*  --  134* 134* 136*  K 3.4*  --  3.4* 3.5* 3.9  CL 96  --  98 97 97  CO2 26  --  25 25 27   GLUCOSE 266*  --  172* 222* 204*  BUN 5*  --  6 7 8   CREATININE 0.77  --  0.60 0.62 0.74  CALCIUM 8.1*  --  8.0* 8.1* 8.2*  MG  --  1.7  --   --   --    Liver Function Tests: No results found for this basename: AST, ALT, ALKPHOS, BILITOT, PROT, ALBUMIN,  in the last 168 hours No results found for this basename: LIPASE, AMYLASE,  in the last 168 hours No results found for this basename: AMMONIA,  in the last 168 hours CBC:  Recent Labs Lab 06/24/14 2245 06/26/14 0347  WBC 12.4* 7.2  NEUTROABS 10.1*  --   HGB 13.2 12.9*  HCT 39.0 38.5*  MCV 88.6 90.2  PLT 217 181   Cardiac Enzymes:  Recent Labs Lab 06/25/14 0130 06/26/14 1704 06/26/14 2202 06/27/14 0500  TROPONINI <0.30 <0.30 <0.30 <0.30   BNP: BNP (last 3 results)  Recent Labs  12/23/13 0936 03/21/14 1551 04/01/14 1431  PROBNP 1247.0* 2003.0* 658.6*   CBG:  Recent Labs  Lab 06/28/14 0743 06/28/14 1149 06/28/14 1611 06/28/14 2015 06/29/14 0743  GLUCAP 223* 263* 198* 208* 186*       Signed:  Mykelle Cockerell A  [facility] 06/29/2014, 8:36 AM

## 2014-06-29 NOTE — Progress Notes (Addendum)
[name] Armed forces technical officer) spoke with pt and pt informed [name] he did not want to dc to [facility]. [name] provided pt with other bed offers. Pt agreeable to dc to [facility]. He expressed that the location of this facility was ideal. Pt only concerns were transportation at discharge from facility and being able to find a cell phone charger. [name] relayed concerns to facility and they informed [name] they would assist pt with both concerns. [name] prepared pt dc packet and placed with shadow chart. [name] arranged non-emergent ambulance transport for 1:30pm. Pt, nurse, and facility informed. Pt declined for [name] to call family/friends and said he would do so himself. [name] signing off.   [place], [place]

## 2014-06-29 NOTE — Progress Notes (Signed)
Pt discharged [facility] [place].  Report call to nurse.

## 2014-06-29 NOTE — Progress Notes (Signed)
[name] notified by Glyn Ade, RN Liaison of [facility] that a bed offer is in place. Will accept patient at d/c. Anticipate d/c today if stable. [name] will facilitate.  Lorie Phenix. Pauline Good, [place]

## 2014-06-29 NOTE — Progress Notes (Signed)
Pt refused to have 18 french coude cath inserted, pt stated he was fine with the 16 french.  Will continue to monitor.

## 2014-06-30 ENCOUNTER — Other Ambulatory Visit: Payer: Self-pay | Admitting: *Deleted

## 2014-06-30 MED ORDER — HYDROCODONE-ACETAMINOPHEN 5-325 MG PO TABS: 1.0000 | ORAL_TABLET | ORAL | Status: DC | PRN

## 2014-06-30 MED ORDER — DIPHENOXYLATE-ATROPINE 2.5-0.025 MG PO TABS: 1.0000 | ORAL_TABLET | Freq: Three times a day (TID) | ORAL | Status: DC | PRN

## 2014-06-30 NOTE — Telephone Encounter (Signed)
[facility] 

## 2014-07-01 ENCOUNTER — Encounter: Payer: Self-pay | Admitting: Internal Medicine

## 2014-07-01 ENCOUNTER — Non-Acute Institutional Stay (SKILLED_NURSING_FACILITY): Payer: Medicare Other | Admitting: Internal Medicine

## 2014-07-01 DIAGNOSIS — K219 Gastro-esophageal reflux disease without esophagitis: Secondary | ICD-10-CM

## 2014-07-01 DIAGNOSIS — R531 Weakness: Secondary | ICD-10-CM

## 2014-07-01 DIAGNOSIS — R55 Syncope and collapse: Secondary | ICD-10-CM | POA: Diagnosis not present

## 2014-07-01 DIAGNOSIS — I5032 Chronic diastolic (congestive) heart failure: Secondary | ICD-10-CM

## 2014-07-01 DIAGNOSIS — N319 Neuromuscular dysfunction of bladder, unspecified: Secondary | ICD-10-CM | POA: Diagnosis not present

## 2014-07-01 DIAGNOSIS — E118 Type 2 diabetes mellitus with unspecified complications: Secondary | ICD-10-CM

## 2014-07-01 NOTE — Progress Notes (Signed)
Patient ID: [name], male   DOB: 1946/09/01, 68 y.o.   MRN: [id number]     Facility: [facility]   PCP: Irven Shelling, MD   Allergies  Allergen Reactions  . Ace Inhibitors Swelling  . Lipitor [Atorvastatin Calcium] Swelling  . Metformin And Related Swelling  . Cefadroxil Hives  . Cephalexin Hives  . Methocarbamol Other (See Comments)    Feels like is in "rocky boat"  . Morphine And Related Other (See Comments)    Sweating, feels like is "in rocky boat."  . Robaxin [Methocarbamol] Other (See Comments)    Feels like he is shaky    Chief Complaint: NEW ADMISSION  HPI:  68 y/o male patient is here for STR after hospital admission from 06/24/14-06/29/14 with traumatic hematuria and syncope. He had syncopal workup. His echocardiogram showed mild concentric hypertrophy with EF 50-55%. Doppler showed 1-39% ICA stenosis. Orthostatics were negative. MRI brain was negative for acute stroke. Neurology recommended no further work up. Cardiology was consulted with chest pain, troponin were negative. He has pending outpatient holter monitor. He was sent to SNF for rehabilitation. He was seen in his room today. He denied any complaints. He then started working with therapy team and walked 50 feet and then sat down and 5 minutes later felt close to passing out and had a feeling of dull discomfort in his chest. When I went to see him with therapy, he complaints of feeling weak but denies any chest pain He has history of morbid obesity, chf, neurogenic bladder s/p foley, CAD, IDDM  Review of Systems:  Constitutional: Negative for fever, chills, diaphoresis.  HENT: Negative for congestion, hearing loss and sore throat.   Eyes: Negative for eye pain, blurred vision, double vision and discharge.  Respiratory: Negative for cough, sputum production, shortness of breath and wheezing.   Cardiovascular: Negative for chest pain, palpitations, orthopnea and leg swelling.    Gastrointestinal: Negative for heartburn, nausea, vomiting, diarrhea and constipation. has some lower abdominal discomfort intermittently Genitourinary: has indwelling foley.  Musculoskeletal: Negative for back pain, falls Skin: Negative for itching and rash.  Neurological: Negative for headaches. has occasional dizziness with change of position. Generalized weakness Psychiatric/Behavioral: Negative for depression.     Past Medical History  Diagnosis Date  . Obese   . Hypertension   . Diabetes mellitus   . Diabetic diarrhea   . Sleep apnea   . Diabetic neuropathy, painful   . Chronic indwelling Foley catheter   . Bladder spasms   . Complication of anesthesia   . PONV (postoperative nausea and vomiting)   . Hyperlipidemia   . GERD (gastroesophageal reflux disease)   . Neurogenic bladder   . Stroke 2013   Past Surgical History  Procedure Laterality Date  . Right tkr    . Gastroplasty    . Cholecystectomy    . Total knee arthroplasty     Social History:   reports that he quit smoking about 37 years ago. He has never used smokeless tobacco. He reports that he does not drink alcohol or use illicit drugs.  Family History  Problem Relation Age of Onset  . Hypertension Mother   . Diabetes type I Father   . Cancer Brother      Testicular Cancer  . Cancer Brother     Leukemia    Medications: Patient's Medications  New Prescriptions   No medications on file  Previous Medications   AMMONIUM LACTATE (AMLACTIN) 12 % CREAM  Apply 1 g topically daily as needed for dry skin.    ASPIRIN EC 81 MG TABLET    Take 81 mg by mouth every morning.    DIPHENOXYLATE-ATROPINE (LOMOTIL) 2.5-0.025 MG PER TABLET    Take 1 tablet by mouth 3 (three) times daily as needed. For diarrhea   FEXOFENADINE (ALLEGRA) 180 MG TABLET    Take 180 mg by mouth daily.   FUROSEMIDE (LASIX) 40 MG TABLET    Take 1 tablet (40 mg total) by mouth daily.   HYDROCODONE-ACETAMINOPHEN (NORCO/VICODIN) 5-325 MG PER  TABLET    Take 1 tablet by mouth every 4 (four) hours as needed for severe pain.   INSULIN ASPART PROTAMINE- ASPART (NOVOLOG MIX 70/30) (70-30) 100 UNIT/ML INJECTION    Inject 0.2 mLs (20 Units total) into the skin 2 (two) times daily with a meal.   METOPROLOL TARTRATE (LOPRESSOR) 25 MG TABLET    Take 1 tablet (25 mg total) by mouth 2 (two) times daily.   MISC. DEVICES (HEART RATE MONITOR) MISC    1 Device by Does not apply route as directed.   NITROGLYCERIN (NITROSTAT) 0.4 MG SL TABLET    Place 0.4 mg under the tongue every 5 (five) minutes as needed for chest pain.   OMEPRAZOLE (PRILOSEC) 20 MG CAPSULE    Take 20 mg by mouth daily.   OXYBUTYNIN (DITROPAN) 5 MG TABLET    Take 5 mg by mouth 3 (three) times daily as needed for bladder spasms.  Modified Medications   No medications on file  Discontinued Medications   No medications on file     Physical Exam: Filed Vitals:   07/01/14 1004  BP: 132/73  Pulse: 73  Temp: 97.4 F (36.3 C)  Resp: 18  Height: 6\' 1"  (1.854 m)  Weight: 355 lb (161.027 kg)   cbg 213  General- elderly male in no acute distress, morbidly obese Head- atraumatic, normocephalic Eyes-  no pallor, no icterus, no discharge Neck- no lymphadenopathy Throat- moist mucus membrane Cardiovascular- normal s1,s2, no murmurs Respiratory- bilateral clear to auscultation, no wheeze, no rhonchi, no crackles, no use of accessory muscles Abdomen- bowel sounds present, soft, non tender, no suprapubic tenderness, has foley in place Musculoskeletal- able to move all 4 extremities, no leg edema Neurological- no focal deficit Skin- warm and dry, chronic stasis changes in both lower legs Psychiatry- alert and oriented to person, place and time, normal mood and affect   Labs reviewed: Basic Metabolic Panel:  Recent Labs  03/21/14 1640  06/25/14 0130 06/26/14 0347 06/27/14 0500 06/28/14 1851  NA 133*  < >  --  134* 134* 136*  K 4.9  < >  --  3.4* 3.5* 3.9  CL 93*  < >  --   98 97 97  CO2 25  < >  --  25 25 27   GLUCOSE 338*  < >  --  172* 222* 204*  BUN 6  < >  --  6 7 8   CREATININE 0.57  < >  --  0.60 0.62 0.74  CALCIUM 8.9  < >  --  8.0* 8.1* 8.2*  MG 1.8  --  1.7  --   --   --   < > = values in this interval not displayed. Liver Function Tests:  Recent Labs  12/24/13 0129 03/21/14 1640  AST 12 33  ALT 9 14  ALKPHOS 82 91  BILITOT 0.9 0.8  PROT 5.9* 7.1  ALBUMIN 2.9* 3.3*   No  results found for this basename: LIPASE, AMYLASE,  in the last 8760 hours No results found for this basename: AMMONIA,  in the last 8760 hours CBC:  Recent Labs  04/26/14 2011 06/24/14 2245 06/26/14 0347  WBC 8.1 12.4* 7.2  NEUTROABS  --  10.1*  --   HGB 14.1 13.2 12.9*  HCT 40.8 39.0 38.5*  MCV 88.5 88.6 90.2  PLT 231 217 181   Cardiac Enzymes:  Recent Labs  06/26/14 1704 06/26/14 2202 06/27/14 0500  TROPONINI <0.30 <0.30 <0.30   BNP: No components found with this basename: POCBNP,  CBG:  Recent Labs  06/28/14 2015 06/29/14 0743 06/29/14 1144  GLUCAP 208* 186* 260*    Radiological Exams: Ct Abdomen Pelvis Wo Contrast  06/25/2014   CLINICAL DATA:  Abdominal pain. Gross hematuria after urinary catheter change at home 06/24/2014.  EXAM: CT ABDOMEN AND PELVIS WITHOUT CONTRAST  TECHNIQUE: Multidetector CT imaging of the abdomen and pelvis was performed following the standard protocol without IV contrast.  COMPARISON:  None.  FINDINGS: BODY WALL: Unremarkable.  LOWER CHEST: Coronary atherosclerosis. Aortic valve calcifications/sclerosis.  ABDOMEN/PELVIS:  Liver: No focal abnormality.  Biliary: Cholecystectomy.  Pancreas: Unremarkable.  Spleen: Unremarkable.  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  Kidneys and ureters: No hydronephrosis or stone.  Bladder: Gas in the urinary bladder is likely from instrumentation. There is a Foley catheter which is partially visualized, with balloon inflated in the mid anterior urethra.  Reproductive: The prostate gland  is either small or surgically absent.  Bowel: Status post gastric bypass. No bowel obstruction or evidence of inflammation. Normal appendix.  Retroperitoneum: No mass or adenopathy.  Peritoneum: No ascites or pneumoperitoneum.  Vascular: No acute abnormality.  OSSEOUS: Bilateral hip osteoarthritis, with joint narrowing advanced on the right. Advanced degenerative disc disease at L4-5, with moderate anterior slip from facet osteoarthritis. There is notable spinal canal stenosis at this level. Vertebral body wedging at the thoracolumbar junction without acute fracture.  Results were called by telephone at the time of interpretation on 06/25/2014 at 3:50 am to Dr. Shanda Howells , who was already aware of these results.  IMPRESSION: 1. Malpositioned Foley catheter with balloon inflated in the anterior urethra. 2. Incidental findings are described above.   Electronically Signed   By: Jorje Guild M.D.   On: 06/25/2014 03:51   Dg Chest 2 View  06/25/2014   CLINICAL DATA:  Stroke.  Initial encounter  EXAM: CHEST  2 VIEW  COMPARISON:  04/01/2014  FINDINGS: Cardiomegaly which is accentuated by a low lung volumes. Tortuous thoracic aorta which has also been noted previously. There is diffuse interstitial opacity with cephalized blood flow. No evidence for effusion or pneumothorax. Surgical clips in the gastric region.  IMPRESSION: CHF pattern.   Electronically Signed   By: Jorje Guild M.D.   On: 06/25/2014 01:49   Ct Head Wo Contrast  06/25/2014   CLINICAL DATA:  Stroke.  Syncope.  Initial encounter  EXAM: CT HEAD WITHOUT CONTRAST  TECHNIQUE: Contiguous axial images were obtained from the base of the skull through the vertex without intravenous contrast.  COMPARISON:  12/23/2013  FINDINGS: Skull and Sinuses:Negative for fracture or destructive process. The mastoids, middle ears, and imaged paranasal sinuses are clear.  Orbits: No acute abnormality.  Brain: No evidence of acute abnormality, such as acute  infarction, hemorrhage, hydrocephalus, or mass lesion/mass effect. Globus pallidus and thalamic calcifications which are symmetric and likely related to calcium dysregulation.  IMPRESSION: No acute intracranial findings.  Electronically Signed   By: Jorje Guild M.D.   On: 06/25/2014 03:31   Mr Jodene Nam Head Wo Contrast  06/25/2014   CLINICAL DATA:  Hematuria and syncope. Stroke. Confusion. The examination had to be discontinued prior to completion due to patient refusal for further imaging.  EXAM: MRI HEAD WITHOUT CONTRAST  MRA HEAD WITHOUT CONTRAST  TECHNIQUE: Multiplanar, multiecho pulse sequences of the brain and surrounding structures were obtained without intravenous contrast. Angiographic images of the head were obtained using MRA technique without contrast.  COMPARISON:  CT head from the same day.  MRI brain 12/25/2013.  FINDINGS: MRI HEAD FINDINGS  No acute infarct, hemorrhage, or mass lesion is present. The ventricles are of normal size. Mild dilated perivascular spaces are present without evidence for acute or focal infarct. No significant white matter disease is present.  Flow is present in the major intracranial arteries. The globes and orbits are intact. Mild mucosal thickening is present in the ethmoid air cells. The remaining paranasal sinuses and the mastoid air cells are clear.  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 The internal carotid arteries are otherwise within normal limits. The A1 and M1 segments are normal. The MCA bifurcations are within normal limits. A 1 M1 segments are normal. The anterior communicating artery is patent. There is minimal attenuation of distal branch vessels.  The left vertebral artery is the dominant vessel. PICA origins are not well visualized. A echo branches are noted bilaterally. The basilar artery is within normal limits. Both posterior cerebral arteries originate  from the basilar tip. There is asymmetric attenuation of the right P2 segment with a moderate focal stenosis. Mild to moderate attenuation of distal PCA branch vessels is noted bilaterally.  IMPRESSION: 1. Normal MRI of the brain for age. 2. 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 3. Focal moderate stenosis of the right P2 segment with mild to moderate attenuation of PCA branch vessels bilaterally.   Electronically Signed   By: Lawrence Santiago M.D.   On: 06/25/2014 14:40   Mr Brain Wo Contrast  06/25/2014   CLINICAL DATA:  Hematuria and syncope. Stroke. Confusion. The examination had to be discontinued prior to completion due to patient refusal for further imaging.  EXAM: MRI HEAD WITHOUT CONTRAST  MRA HEAD WITHOUT CONTRAST  TECHNIQUE: Multiplanar, multiecho pulse sequences of the brain and surrounding structures were obtained without intravenous contrast. Angiographic images of the head were obtained using MRA technique without contrast.  COMPARISON:  CT head from the same day.  MRI brain 12/25/2013.  FINDINGS: MRI HEAD FINDINGS  No acute infarct, hemorrhage, or mass lesion is present. The ventricles are of normal size. Mild dilated perivascular spaces are present without evidence for acute or focal infarct. No significant white matter disease is present.  Flow is present in the major intracranial arteries. The globes and orbits are intact. Mild mucosal thickening is present in the ethmoid air cells. The remaining paranasal sinuses and the mastoid air cells are clear.  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 The internal carotid arteries are otherwise within normal limits. The A1 and M1 segments are normal. The MCA bifurcations are within normal limits. A 1 M1 segments are normal. The anterior communicating artery is patent. There is minimal attenuation of distal branch vessels.  The left vertebral  artery  is the dominant vessel. PICA origins are not well visualized. A echo branches are noted bilaterally. The basilar artery is within normal limits. Both posterior cerebral arteries originate from the basilar tip. There is asymmetric attenuation of the right P2 segment with a moderate focal stenosis. Mild to moderate attenuation of distal PCA branch vessels is noted bilaterally.  IMPRESSION: 1. Normal MRI of the brain for age. 2. 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 3. Focal moderate stenosis of the right P2 segment with mild to moderate attenuation of PCA branch vessels bilaterally.   Electronically Signed   By: Lawrence Santiago M.D.   On: 06/25/2014 14:40   Mr Attempted Daymon Larsen Report  06/25/2014   This examination belongs to an outside facility and is stored  here for comparison purposes only.  Contact the originating outside  institution for any associated report or interpretation.   Assessment/Plan  Generalized weakness Will have patient work with PT/OT as tolerated to regain strength and restore function.  Fall precautions are in place  Pre syncope Likely vasovagal given he was exercising and his deconditioning. He had syncopal episode in hospital and workup was negative. Will get ekg to rule out cardiac etiology. Check orthostatic vitals daily x 3 days. Encouraged hydration. Cardiology f/u for holter monitor   Neurogenic bladder Continue foley with foley care, has outpatient follow up. Continue oxybutynin prn (prior regimen)  chf Stable, continue lasix 40 mg daily with metoprolol 25 mg bid for now  Dm On novolog 20 u bid, check a1c. Monitor cbg  gerd Continue prilosec 20 mg daily   Family/ staff Communication: reviewed care plan with patient and nursing supervisor   Goals of care: short term rehabilitation    Labs/tests ordered: cbc with diff, cmp, a1c    Blanchie Serve, MD  [facility] [phone] (Monday-Friday 8 am - 5  pm) [phone] (afterhours)

## 2014-07-08 ENCOUNTER — Non-Acute Institutional Stay (SKILLED_NURSING_FACILITY): Payer: Medicare Other | Admitting: Internal Medicine

## 2014-07-08 DIAGNOSIS — E1165 Type 2 diabetes mellitus with hyperglycemia: Secondary | ICD-10-CM | POA: Diagnosis not present

## 2014-07-18 NOTE — Progress Notes (Signed)
Patient ID: YEAB CODD, male   DOB: 12/12/1945, 68 y.o.   MRN: [id number]    Facility: [facility] Chief Complaint  Patient presents with  . Acute Visit    elevated blood glucose reading   Allergies  Allergen Reactions  . Ace Inhibitors Swelling  . Lipitor [Atorvastatin Calcium] Swelling  . Metformin And Related Swelling  . Cefadroxil Hives  . Cephalexin Hives  . Methocarbamol Other (See Comments)    Feels like is in "rocky boat"  . Morphine And Related Other (See Comments)    Sweating, feels like is "in rocky boat."  . Robaxin [Methocarbamol] Other (See Comments)    Feels like he is shaky   HPI:   68 y/o male patient is seen today for acute concerns. He has hx of DM and his cbg has been running grester than 200. Reviewed labs from 07/02/14 where his a1c is 8.6. He is currently on novolog. He is here for STR after hospital admission from 06/24/14-06/29/14 with traumatic hematuria and syncope.He has history of morbid obesity, chf, neurogenic bladder s/p foley, CAD, IDDM  Review of Systems:  Constitutional: Negative for fever, chills, diaphoresis.  HENT: Negative for congestion, hearing loss and sore throat.   Eyes: Negative for eye pain, blurred vision, double vision and discharge.  Respiratory: Negative for cough, sputum production, shortness of breath and wheezing.   Cardiovascular: Negative for chest pain, palpitations, orthopnea and leg swelling.  Gastrointestinal: Negative for heartburn, nausea, vomiting, diarrhea and constipation.  Genitourinary: has indwelling foley.  Musculoskeletal: Negative for back pain, falls Skin: Negative for itching and rash.  Neurological: Negative for headaches. Dizziness related to postural change has slightly improved Psychiatric/Behavioral: Negative for depression.    Past Medical History  Diagnosis Date  . Obese   . Hypertension   . Diabetes mellitus   . Diabetic diarrhea   . Sleep apnea   . Diabetic  neuropathy, painful   . Chronic indwelling Foley catheter   . Bladder spasms   . Complication of anesthesia   . PONV (postoperative nausea and vomiting)   . Hyperlipidemia   . GERD (gastroesophageal reflux disease)   . Neurogenic bladder   . Stroke 2013   Medication reviewed. See [facility]  Physical exam BP 134/68 mmHg  Pulse 68  Temp(Src) 98 F (36.7 C)  Resp 18  Wt 355 lb (161.027 kg)  SpO2 96%  General- elderly male in no acute distress, morbidly obese Cardiovascular- normal s1,s2, no murmurs Respiratory- bilateral clear to auscultation, no wheeze, no rhonchi, no crackles, no use of accessory muscles Abdomen- bowel sounds present, soft, non tender, no suprapubic tenderness, has foley in place Musculoskeletal- able to move all 4 extremities, no leg edema Neurological- no focal deficit Skin- warm and dry, chronic stasis changes in both lower legs Psychiatry- alert and oriented to person, place and time, normal mood and affect  Labs 07/02/14 a1c 8.6, hb 12.6, hct 38.3, plt 201, wbc 6.6, na 134, k 3.8, glu 273, bun 7, cr 0.64  Assessment/plan  Dm type 2 Currently on novolog 20 u bid. Pt mentions that he tolerates novolog better and would not like to be changed to long acting insulin. Thus change his novolog to 30 u bid, monitor cbg and adjust dosing further if needed.

## 2014-07-20 ENCOUNTER — Non-Acute Institutional Stay (SKILLED_NURSING_FACILITY): Payer: Medicare Other | Admitting: Internal Medicine

## 2014-07-20 DIAGNOSIS — R55 Syncope and collapse: Secondary | ICD-10-CM

## 2014-07-20 DIAGNOSIS — K219 Gastro-esophageal reflux disease without esophagitis: Secondary | ICD-10-CM

## 2014-07-20 DIAGNOSIS — R531 Weakness: Secondary | ICD-10-CM | POA: Diagnosis not present

## 2014-07-20 DIAGNOSIS — E118 Type 2 diabetes mellitus with unspecified complications: Secondary | ICD-10-CM

## 2014-07-20 DIAGNOSIS — I5032 Chronic diastolic (congestive) heart failure: Secondary | ICD-10-CM

## 2014-07-20 NOTE — Progress Notes (Signed)
Patient ID: [name], male   DOB: [birth year], 68 y.o.   MRN: [id number]  Location: [facility] [place] Mariea Clonts, D.O., C.M.D.  PCP: Irven Shelling, MD  Allergies  Allergen Reactions  . Ace Inhibitors Swelling  . Lipitor [Atorvastatin Calcium] Swelling  . Metformin And Related Swelling  . Cefadroxil Hives  . Cephalexin Hives  . Methocarbamol Other (See Comments)    Feels like is in "rocky boat"  . Morphine And Related Other (See Comments)    Sweating, feels like is "in rocky boat."  . Robaxin [Methocarbamol] Other (See Comments)    Feels like he is shaky    Chief Complaint  Patient presents with  . Acute Visit    shortness of breath  . Discharge Note    HPI:  68 yo male with h/o morbid obesity, chf, neurogenic bladder s/p foley, CAD, IDDM has been here for short term rehab after hospital admission from 06/24/14-06/29/14 with traumatic hematuria and syncope.   Review from H&P:  He had syncopal workup. His echocardiogram showed mild concentric hypertrophy with EF 50-55%. Doppler showed 1-39% ICA stenosis. Orthostatics were negative. MRI brain was negative for acute stroke. Neurology recommended no further work up. Cardiology was consulted with chest pain, troponin were negative. He has pending outpatient holter monitor. He was sent to SNF for rehabilitation.   Had near syncopal episode and short-lived episode of chest pain in therapy 10/22, and continued to feel weak vs. His baseline.  He has made improvements and now will go home with outpatient PT.   Review of Systems:  Review of Systems  Constitutional: Positive for malaise/fatigue.  HENT: Negative for congestion.   Respiratory: Negative for shortness of breath.   Cardiovascular: Negative for chest pain.  Gastrointestinal: Negative for abdominal pain.  Musculoskeletal: Negative for falls.  Neurological: Positive for loss of consciousness and weakness.  Psychiatric/Behavioral: Negative for  depression.     Past Medical History  Diagnosis Date  . Obese   . Hypertension   . Diabetes mellitus   . Diabetic diarrhea   . Sleep apnea   . Diabetic neuropathy, painful   . Chronic indwelling Foley catheter   . Bladder spasms   . Complication of anesthesia   . PONV (postoperative nausea and vomiting)   . Hyperlipidemia   . GERD (gastroesophageal reflux disease)   . Neurogenic bladder   . Stroke 2013    Past Surgical History  Procedure Laterality Date  . Right tkr    . Gastroplasty    . Cholecystectomy    . Total knee arthroplasty      Social History:   reports that he quit smoking about 37 years ago. He has never used smokeless tobacco. He reports that he does not drink alcohol or use illicit drugs.  Family History  Problem Relation Age of Onset  . Hypertension Mother   . Diabetes type I Father   . Cancer Brother      Testicular Cancer  . Cancer Brother     Leukemia    Medications: Patient's Medications  New Prescriptions   No medications on file  Previous Medications   AMMONIUM LACTATE (AMLACTIN) 12 % CREAM    Apply 1 g topically daily as needed for dry skin.    ASPIRIN EC 81 MG TABLET    Take 81 mg by mouth every morning.    DIPHENOXYLATE-ATROPINE (LOMOTIL) 2.5-0.025 MG PER TABLET    Take 1 tablet by mouth 3 (three) times daily as needed.  For diarrhea   FEXOFENADINE (ALLEGRA) 180 MG TABLET    Take 180 mg by mouth daily.   FUROSEMIDE (LASIX) 40 MG TABLET    Take 1 tablet (40 mg total) by mouth daily.   HYDROCODONE-ACETAMINOPHEN (NORCO/VICODIN) 5-325 MG PER TABLET    Take 1 tablet by mouth every 4 (four) hours as needed for severe pain.   INSULIN ASPART PROTAMINE- ASPART (NOVOLOG MIX 70/30) (70-30) 100 UNIT/ML INJECTION    Inject 0.2 mLs (20 Units total) into the skin 2 (two) times daily with a meal.   METOPROLOL TARTRATE (LOPRESSOR) 25 MG TABLET    Take 1 tablet (25 mg total) by mouth 2 (two) times daily.   MISC. DEVICES (HEART RATE MONITOR) MISC    1  Device by Does not apply route as directed.   NITROGLYCERIN (NITROSTAT) 0.4 MG SL TABLET    Place 0.4 mg under the tongue every 5 (five) minutes as needed for chest pain.   OMEPRAZOLE (PRILOSEC) 20 MG CAPSULE    Take 20 mg by mouth daily.   OXYBUTYNIN (DITROPAN) 5 MG TABLET    Take 5 mg by mouth 3 (three) times daily as needed for bladder spasms.  Modified Medications   No medications on file  Discontinued Medications   No medications on file    Physical Exam: There were no vitals filed for this visit. Physical Exam  Constitutional: He is oriented to person, place, and time. No distress.  Cardiovascular: Normal rate, regular rhythm and normal heart sounds.   Pulmonary/Chest: Breath sounds normal. No respiratory distress.  Abdominal: Soft. Bowel sounds are normal. He exhibits no distension and no mass. There is no tenderness.  Neurological: He is alert and oriented to person, place, and time.    Labs reviewed: Basic Metabolic Panel:  Recent Labs  03/21/14 1640  06/25/14 0130 06/26/14 0347 06/27/14 0500 06/28/14 1851  NA 133*  < >  --  134* 134* 136*  K 4.9  < >  --  3.4* 3.5* 3.9  CL 93*  < >  --  98 97 97  CO2 25  < >  --  25 25 27   GLUCOSE 338*  < >  --  172* 222* 204*  BUN 6  < >  --  6 7 8   CREATININE 0.57  < >  --  0.60 0.62 0.74  CALCIUM 8.9  < >  --  8.0* 8.1* 8.2*  MG 1.8  --  1.7  --   --   --   < > = values in this interval not displayed. Liver Function Tests:  Recent Labs  12/24/13 0129 03/21/14 1640  AST 12 33  ALT 9 14  ALKPHOS 82 91  BILITOT 0.9 0.8  PROT 5.9* 7.1  ALBUMIN 2.9* 3.3*   No results for input(s): LIPASE, AMYLASE in the last 8760 hours. No results for input(s): AMMONIA in the last 8760 hours. CBC:  Recent Labs  04/26/14 2011 06/24/14 2245 06/26/14 0347  WBC 8.1 12.4* 7.2  NEUTROABS  --  10.1*  --   HGB 14.1 13.2 12.9*  HCT 40.8 39.0 38.5*  MCV 88.5 88.6 90.2  PLT 231 217 181   Cardiac Enzymes:  Recent Labs   06/26/14 1704 06/26/14 2202 06/27/14 0500  TROPONINI <0.30 <0.30 <0.30   BNP: Invalid input(s): POCBNP CBG:  Recent Labs  06/28/14 2015 06/29/14 0743 06/29/14 1144  GLUCAP 208* 186* 260*   Assessment/Plan:   1. Pre-syncope -f/u with cardiology to complete  workup from syncopal episode at hospital  2. Generalized weakness -has improved somewhat with therapy -cont with outpatient PT  3. Chronic diastolic CHF (congestive heart failure) -daily weights, sodium restriction and cont lopressor, lasix, asa, monitor potassium  4. DM (diabetes mellitus) with complications -cont novolog mix 20 units twice daily and regular cbg monitoring, encouraged diet and exercise  5. Gastroesophageal reflux disease, esophagitis presence not specified -cont omeprazole  Patient is being discharged with home health services:  None needed, but will have outpatient PT  Patient is being discharged with the following durable medical equipment:  No needs  Patient has been advised to f/u with their PCP, [name], in 1-2 weeks to bring them up to date on their rehab stay.  They were provided with a 30 day supply of scripts for prescription medications and refills must be obtained from their PCP.

## 2014-07-23 ENCOUNTER — Other Ambulatory Visit: Payer: Self-pay | Admitting: *Deleted

## 2014-07-23 DIAGNOSIS — R55 Syncope and collapse: Secondary | ICD-10-CM

## 2014-07-28 ENCOUNTER — Ambulatory Visit: Payer: Medicare Other

## 2014-08-01 ENCOUNTER — Encounter: Payer: Self-pay | Admitting: Internal Medicine

## 2014-08-19 ENCOUNTER — Encounter (HOSPITAL_COMMUNITY): Payer: Self-pay | Admitting: Cardiology

## 2014-08-27 ENCOUNTER — Emergency Department (HOSPITAL_COMMUNITY): Payer: Medicare Other

## 2014-08-27 ENCOUNTER — Inpatient Hospital Stay (HOSPITAL_COMMUNITY): Payer: Medicare Other

## 2014-08-27 ENCOUNTER — Inpatient Hospital Stay (HOSPITAL_COMMUNITY)
Admission: EM | Admit: 2014-08-27 | Discharge: 2014-08-31 | DRG: 689 | Disposition: A | Discharge status: Skilled Nursing Facility | Payer: Medicare Other | Attending: Internal Medicine | Admitting: Internal Medicine

## 2014-08-27 DIAGNOSIS — R400 Somnolence: Secondary | ICD-10-CM

## 2014-08-27 DIAGNOSIS — Z79899 Other long term (current) drug therapy: Secondary | ICD-10-CM

## 2014-08-27 DIAGNOSIS — I509 Heart failure, unspecified: Secondary | ICD-10-CM | POA: Diagnosis not present

## 2014-08-27 DIAGNOSIS — R6889 Other general symptoms and signs: Secondary | ICD-10-CM | POA: Diagnosis not present

## 2014-08-27 DIAGNOSIS — Z87891 Personal history of nicotine dependence: Secondary | ICD-10-CM | POA: Diagnosis not present

## 2014-08-27 DIAGNOSIS — Z9049 Acquired absence of other specified parts of digestive tract: Secondary | ICD-10-CM | POA: Diagnosis present

## 2014-08-27 DIAGNOSIS — R112 Nausea with vomiting, unspecified: Secondary | ICD-10-CM | POA: Diagnosis not present

## 2014-08-27 DIAGNOSIS — I2510 Atherosclerotic heart disease of native coronary artery without angina pectoris: Secondary | ICD-10-CM | POA: Diagnosis present

## 2014-08-27 DIAGNOSIS — L03116 Cellulitis of left lower limb: Secondary | ICD-10-CM | POA: Diagnosis present

## 2014-08-27 DIAGNOSIS — I5032 Chronic diastolic (congestive) heart failure: Secondary | ICD-10-CM | POA: Diagnosis present

## 2014-08-27 DIAGNOSIS — R109 Unspecified abdominal pain: Secondary | ICD-10-CM | POA: Diagnosis not present

## 2014-08-27 DIAGNOSIS — Z8673 Personal history of transient ischemic attack (TIA), and cerebral infarction without residual deficits: Secondary | ICD-10-CM

## 2014-08-27 DIAGNOSIS — Z978 Presence of other specified devices: Secondary | ICD-10-CM

## 2014-08-27 DIAGNOSIS — Z888 Allergy status to other drugs, medicaments and biological substances status: Secondary | ICD-10-CM | POA: Diagnosis not present

## 2014-08-27 DIAGNOSIS — I10 Essential (primary) hypertension: Secondary | ICD-10-CM | POA: Diagnosis present

## 2014-08-27 DIAGNOSIS — K5900 Constipation, unspecified: Secondary | ICD-10-CM | POA: Diagnosis present

## 2014-08-27 DIAGNOSIS — E1140 Type 2 diabetes mellitus with diabetic neuropathy, unspecified: Secondary | ICD-10-CM | POA: Diagnosis present

## 2014-08-27 DIAGNOSIS — R410 Disorientation, unspecified: Secondary | ICD-10-CM | POA: Diagnosis not present

## 2014-08-27 DIAGNOSIS — R404 Transient alteration of awareness: Secondary | ICD-10-CM | POA: Diagnosis not present

## 2014-08-27 DIAGNOSIS — I25119 Atherosclerotic heart disease of native coronary artery with unspecified angina pectoris: Secondary | ICD-10-CM | POA: Diagnosis not present

## 2014-08-27 DIAGNOSIS — Z7982 Long term (current) use of aspirin: Secondary | ICD-10-CM | POA: Diagnosis not present

## 2014-08-27 DIAGNOSIS — G9340 Encephalopathy, unspecified: Secondary | ICD-10-CM | POA: Diagnosis not present

## 2014-08-27 DIAGNOSIS — N390 Urinary tract infection, site not specified: Principal | ICD-10-CM | POA: Diagnosis present

## 2014-08-27 DIAGNOSIS — F99 Mental disorder, not otherwise specified: Secondary | ICD-10-CM | POA: Diagnosis not present

## 2014-08-27 DIAGNOSIS — Z960 Presence of urogenital implants: Secondary | ICD-10-CM

## 2014-08-27 DIAGNOSIS — L98419 Non-pressure chronic ulcer of buttock with unspecified severity: Secondary | ICD-10-CM | POA: Diagnosis present

## 2014-08-27 DIAGNOSIS — N319 Neuromuscular dysfunction of bladder, unspecified: Secondary | ICD-10-CM | POA: Diagnosis present

## 2014-08-27 DIAGNOSIS — G4730 Sleep apnea, unspecified: Secondary | ICD-10-CM | POA: Diagnosis present

## 2014-08-27 DIAGNOSIS — Z6841 Body Mass Index (BMI) 40.0 and over, adult: Secondary | ICD-10-CM | POA: Diagnosis not present

## 2014-08-27 DIAGNOSIS — Z9889 Other specified postprocedural states: Secondary | ICD-10-CM | POA: Diagnosis not present

## 2014-08-27 DIAGNOSIS — Z881 Allergy status to other antibiotic agents status: Secondary | ICD-10-CM

## 2014-08-27 DIAGNOSIS — R55 Syncope and collapse: Secondary | ICD-10-CM | POA: Diagnosis not present

## 2014-08-27 DIAGNOSIS — E118 Type 2 diabetes mellitus with unspecified complications: Secondary | ICD-10-CM | POA: Diagnosis not present

## 2014-08-27 DIAGNOSIS — R4182 Altered mental status, unspecified: Secondary | ICD-10-CM | POA: Diagnosis not present

## 2014-08-27 DIAGNOSIS — Z794 Long term (current) use of insulin: Secondary | ICD-10-CM

## 2014-08-27 DIAGNOSIS — M6281 Muscle weakness (generalized): Secondary | ICD-10-CM | POA: Diagnosis not present

## 2014-08-27 DIAGNOSIS — E785 Hyperlipidemia, unspecified: Secondary | ICD-10-CM | POA: Diagnosis present

## 2014-08-27 DIAGNOSIS — R079 Chest pain, unspecified: Secondary | ICD-10-CM

## 2014-08-27 DIAGNOSIS — R072 Precordial pain: Secondary | ICD-10-CM | POA: Diagnosis not present

## 2014-08-27 DIAGNOSIS — K219 Gastro-esophageal reflux disease without esophagitis: Secondary | ICD-10-CM | POA: Diagnosis present

## 2014-08-27 DIAGNOSIS — E669 Obesity, unspecified: Secondary | ICD-10-CM | POA: Diagnosis not present

## 2014-08-27 HISTORY — DX: Atherosclerotic heart disease of native coronary artery without angina pectoris: I25.10

## 2014-08-27 LAB — TROPONIN I

## 2014-08-27 LAB — AMMONIA: AMMONIA: 15 umol/L (ref 11–60)

## 2014-08-27 LAB — COMPREHENSIVE METABOLIC PANEL
ALT: 10 U/L (ref 0–53)
ANION GAP: 14 (ref 5–15)
AST: 12 U/L (ref 0–37)
Albumin: 3.5 g/dL (ref 3.5–5.2)
Alkaline Phosphatase: 112 U/L (ref 39–117)
BUN: 11 mg/dL (ref 6–23)
CO2: 27 meq/L (ref 19–32)
CREATININE: 0.64 mg/dL (ref 0.50–1.35)
Calcium: 9.3 mg/dL (ref 8.4–10.5)
Chloride: 94 mEq/L — ABNORMAL LOW (ref 96–112)
GFR calc Af Amer: >90 mL/min (ref >90)
GLUCOSE: 209 mg/dL — AB (ref 70–99)
Potassium: 4.2 mEq/L (ref 3.7–5.3)
SODIUM: 135 meq/L — AB (ref 137–147)
TOTAL PROTEIN: 8 g/dL (ref 6.0–8.3)
Total Bilirubin: 0.5 mg/dL (ref 0.3–1.2)

## 2014-08-27 LAB — CBC WITH DIFFERENTIAL/PLATELET
BASOS ABS: 0.1 10*3/uL (ref 0.0–0.1)
Basophils Relative: 1 % (ref 0–1)
Eosinophils Absolute: 0.3 10*3/uL (ref 0.0–0.7)
Eosinophils Relative: 4 % (ref 0–5)
HCT: 44.1 % (ref 39.0–52.0)
Hemoglobin: 15 g/dL (ref 13.0–17.0)
LYMPHS ABS: 1.4 10*3/uL (ref 0.7–4.0)
LYMPHS PCT: 16 % (ref 12–46)
MCH: 29.6 pg (ref 26.0–34.0)
MCHC: 34 g/dL (ref 30.0–36.0)
MCV: 87.2 fL (ref 78.0–100.0)
Monocytes Absolute: 0.6 10*3/uL (ref 0.1–1.0)
Monocytes Relative: 7 % (ref 3–12)
NEUTROS PCT: 73 % (ref 43–77)
Neutro Abs: 6.2 10*3/uL (ref 1.7–7.7)
PLATELETS: 215 10*3/uL (ref 150–400)
RBC: 5.06 MIL/uL (ref 4.22–5.81)
RDW: 13.5 % (ref 11.5–15.5)
WBC: 8.5 10*3/uL (ref 4.0–10.5)

## 2014-08-27 LAB — URINALYSIS, ROUTINE W REFLEX MICROSCOPIC
Bilirubin Urine: NEGATIVE
Glucose, UA: NEGATIVE mg/dL
KETONES UR: NEGATIVE mg/dL
Nitrite: NEGATIVE
Protein, ur: 30 mg/dL — AB
Specific Gravity, Urine: 1.003 — ABNORMAL LOW (ref 1.005–1.030)
Urobilinogen, UA: 1 mg/dL (ref 0.0–1.0)
pH: 7.5 (ref 5.0–8.0)

## 2014-08-27 LAB — RAPID URINE DRUG SCREEN, HOSP PERFORMED
Amphetamines: NOT DETECTED
BARBITURATES: NOT DETECTED
Benzodiazepines: NOT DETECTED
Cocaine: NOT DETECTED
OPIATES: NOT DETECTED
Tetrahydrocannabinol: NOT DETECTED

## 2014-08-27 LAB — GRAM STAIN

## 2014-08-27 LAB — URINE MICROSCOPIC-ADD ON

## 2014-08-27 LAB — PRO B NATRIURETIC PEPTIDE: PRO B NATRI PEPTIDE: 1121 pg/mL — AB (ref 0–125)

## 2014-08-27 LAB — TSH: TSH: 2.95 u[IU]/mL (ref 0.350–4.500)

## 2014-08-27 LAB — LIPASE, BLOOD: Lipase: 18 U/L (ref 11–59)

## 2014-08-27 LAB — CBG MONITORING, ED: GLUCOSE-CAPILLARY: 203 mg/dL — AB (ref 70–99)

## 2014-08-27 MED ORDER — ACETAMINOPHEN 650 MG RE SUPP: 650.0000 mg | Freq: Four times a day (QID) | RECTAL | Status: DC | PRN

## 2014-08-27 MED ORDER — DEXTROSE 5 % IV SOLN
1.0000 g | INTRAVENOUS | Status: DC
  Filled 2014-08-27: qty 10

## 2014-08-27 MED ORDER — METOPROLOL TARTRATE 25 MG PO TABS
25.0000 mg | ORAL_TABLET | Freq: Two times a day (BID) | ORAL | Status: DC
  Administered 2014-08-27 – 2014-08-31 (×8): 25 mg via ORAL
  Filled 2014-08-27 (×9): qty 1

## 2014-08-27 MED ORDER — INSULIN ASPART 100 UNIT/ML ~~LOC~~ SOLN
0.0000 [IU] | Freq: Three times a day (TID) | SUBCUTANEOUS | Status: DC
  Administered 2014-08-28: 3 [IU] via SUBCUTANEOUS
  Administered 2014-08-28: 5 [IU] via SUBCUTANEOUS
  Administered 2014-08-29: 2 [IU] via SUBCUTANEOUS
  Administered 2014-08-29: 1 [IU] via SUBCUTANEOUS
  Administered 2014-08-29 – 2014-08-30 (×2): 2 [IU] via SUBCUTANEOUS
  Administered 2014-08-30: 3 [IU] via SUBCUTANEOUS
  Administered 2014-08-30 – 2014-08-31 (×2): 2 [IU] via SUBCUTANEOUS

## 2014-08-27 MED ORDER — ONDANSETRON HCL 4 MG PO TABS: 4.0000 mg | ORAL_TABLET | Freq: Four times a day (QID) | ORAL | Status: DC | PRN

## 2014-08-27 MED ORDER — CEFTRIAXONE SODIUM 1 G IJ SOLR: 1.0000 g | Freq: Once | INTRAMUSCULAR | Status: DC

## 2014-08-27 MED ORDER — ACETAMINOPHEN 325 MG PO TABS
650.0000 mg | ORAL_TABLET | Freq: Four times a day (QID) | ORAL | Status: DC | PRN
  Administered 2014-08-30: 650 mg via ORAL
  Filled 2014-08-27: qty 2

## 2014-08-27 MED ORDER — ENOXAPARIN SODIUM 30 MG/0.3ML ~~LOC~~ SOLN
30.0000 mg | SUBCUTANEOUS | Status: DC
  Administered 2014-08-27: 30 mg via SUBCUTANEOUS
  Filled 2014-08-27 (×2): qty 0.3

## 2014-08-27 MED ORDER — OXYBUTYNIN CHLORIDE 5 MG PO TABS
5.0000 mg | ORAL_TABLET | Freq: Three times a day (TID) | ORAL | Status: DC | PRN
  Filled 2014-08-27: qty 1

## 2014-08-27 MED ORDER — HYDROCODONE-ACETAMINOPHEN 5-325 MG PO TABS
1.0000 | ORAL_TABLET | ORAL | Status: DC | PRN
  Administered 2014-08-28 – 2014-08-31 (×4): 2 via ORAL
  Filled 2014-08-27 (×4): qty 2

## 2014-08-27 MED ORDER — NITROGLYCERIN 0.4 MG SL SUBL
0.4000 mg | SUBLINGUAL_TABLET | SUBLINGUAL | Status: DC | PRN
Start: 2014-08-27 — End: 2014-08-31
  Administered 2014-08-27 – 2014-08-30 (×2): 0.4 mg via SUBLINGUAL
  Filled 2014-08-27: qty 1

## 2014-08-27 MED ORDER — FLEET ENEMA 7-19 GM/118ML RE ENEM
1.0000 | ENEMA | Freq: Once | RECTAL | Status: DC
Start: 2014-08-27 — End: 2014-08-31
  Filled 2014-08-27: qty 1

## 2014-08-27 MED ORDER — DOCUSATE SODIUM 100 MG PO CAPS
100.0000 mg | ORAL_CAPSULE | Freq: Two times a day (BID) | ORAL | Status: DC
  Administered 2014-08-27 – 2014-08-31 (×5): 100 mg via ORAL
  Filled 2014-08-27 (×10): qty 1

## 2014-08-27 MED ORDER — DIPHENHYDRAMINE HCL 50 MG/ML IJ SOLN
25.0000 mg | Freq: Once | INTRAMUSCULAR | Status: AC
  Administered 2014-08-27: 25 mg via INTRAVENOUS
  Filled 2014-08-27: qty 1

## 2014-08-27 MED ORDER — ASPIRIN EC 81 MG PO TBEC
81.0000 mg | DELAYED_RELEASE_TABLET | Freq: Every morning | ORAL | Status: DC
  Administered 2014-08-28 – 2014-08-31 (×4): 81 mg via ORAL
  Filled 2014-08-27 (×4): qty 1

## 2014-08-27 MED ORDER — CEFTRIAXONE SODIUM IN DEXTROSE 20 MG/ML IV SOLN: 1.0000 g | INTRAVENOUS | Status: DC

## 2014-08-27 MED ORDER — DEXTROSE 5 % IV SOLN
1.0000 g | Freq: Once | INTRAVENOUS | Status: AC
  Administered 2014-08-27: 1 g via INTRAVENOUS
  Filled 2014-08-27: qty 10

## 2014-08-27 MED ORDER — LEVOFLOXACIN IN D5W 500 MG/100ML IV SOLN: 500.0000 mg | INTRAVENOUS | Status: DC

## 2014-08-27 MED ORDER — PANTOPRAZOLE SODIUM 40 MG PO TBEC
40.0000 mg | DELAYED_RELEASE_TABLET | Freq: Every day | ORAL | Status: DC
  Administered 2014-08-28 – 2014-08-31 (×4): 40 mg via ORAL
  Filled 2014-08-27 (×4): qty 1

## 2014-08-27 MED ORDER — SODIUM CHLORIDE 0.9 % IJ SOLN
3.0000 mL | Freq: Two times a day (BID) | INTRAMUSCULAR | Status: DC
  Administered 2014-08-27 – 2014-08-31 (×6): 3 mL via INTRAVENOUS

## 2014-08-27 MED ORDER — ONDANSETRON HCL 4 MG/2ML IJ SOLN: 4.0000 mg | Freq: Four times a day (QID) | INTRAMUSCULAR | Status: DC | PRN

## 2014-08-27 MED ORDER — POLYETHYLENE GLYCOL 3350 17 G PO PACK
17.0000 g | PACK | Freq: Two times a day (BID) | ORAL | Status: DC
  Administered 2014-08-27 – 2014-08-30 (×3): 17 g via ORAL
  Filled 2014-08-27 (×9): qty 1

## 2014-08-27 MED ORDER — LORAZEPAM 2 MG/ML IJ SOLN
1.0000 mg | Freq: Once | INTRAMUSCULAR | Status: AC
  Administered 2014-08-27: 1 mg via INTRAVENOUS
  Filled 2014-08-27: qty 1

## 2014-08-27 NOTE — ED Notes (Signed)
Report given to [facility]

## 2014-08-27 NOTE — ED Notes (Signed)
Patient returned from MRI.

## 2014-08-27 NOTE — ED Notes (Signed)
Pt states that "your voices are going in and out" states spoke with girlfriend and friend yesterday. Foley catheter changed-- d/c'd indwelling that pt came in with. Replaced with 3f catheter-- 10cc balloon inflated, easily inserted, no resistance met.

## 2014-08-27 NOTE — ED Provider Notes (Signed)
CSN: [id number]     Arrival date & time 08/27/14  1535 History   First MD Initiated Contact with Patient 08/27/14 1536     Chief Complaint  Patient presents with  . Urinary Tract Infection  . Altered Mental Status  . Constipation     (Consider location/radiation/quality/duration/timing/severity/associated sxs/prior Treatment) Patient is a 68 y.o. male presenting with altered mental status. The history is provided by the patient and the EMS personnel.  Altered Mental Status Presenting symptoms: confusion, disorientation and lethargy   Severity:  Moderate Most recent episode:  Today Episode history:  Unable to specify Timing:  Constant Progression:  Waxing and waning Chronicity:  New Context: not a recent change in medication   Associated symptoms: abdominal pain   Associated symptoms: no agitation, no difficulty breathing, no fever, no seizures and no weakness     Past Medical History  Diagnosis Date  . Obese   . Hypertension   . Diabetes mellitus   . Diabetic diarrhea   . Sleep apnea   . Diabetic neuropathy, painful   . Chronic indwelling Foley catheter   . Bladder spasms   . Complication of anesthesia   . PONV (postoperative nausea and vomiting)   . Hyperlipidemia   . GERD (gastroesophageal reflux disease)   . Neurogenic bladder   . Stroke 2013   Past Surgical History  Procedure Laterality Date  . Right tkr    . Gastroplasty    . Cholecystectomy    . Total knee arthroplasty    . Left heart catheterization with coronary angiogram N/A 10/24/2011    Procedure: LEFT HEART CATHETERIZATION WITH CORONARY ANGIOGRAM;  Surgeon: Candee Furbish, MD;  Location: [facility] CATH LAB;  Service: Cardiovascular;  Laterality: N/A;  right radial artery approach   Family History  Problem Relation Age of Onset  . Hypertension Mother   . Diabetes type I Father   . Cancer Brother      Testicular Cancer  . Cancer Brother     Leukemia   History  Substance Use Topics  . Smoking status:  Former Smoker -- 2.00 packs/day for 20 years    Quit date: 09/10/1976  . Smokeless tobacco: Never Used  . Alcohol Use: No     Comment: FORMER ALCOHOLIC WITH 23 YEAR SOBRIETY    Review of Systems  Unable to perform ROS: Mental status change  Constitutional: Negative for fever.  Gastrointestinal: Positive for abdominal pain.  Neurological: Negative for seizures and weakness.  Psychiatric/Behavioral: Positive for confusion. Negative for agitation.      Allergies  Ace inhibitors; Lipitor; Metformin and related; Cefadroxil; Cephalexin; Morphine and related; and Robaxin  Home Medications   Prior to Admission medications   Medication Sig Start Date End Date Taking? Authorizing Provider  aspirin EC 81 MG tablet Take 81 mg by mouth every morning.    Yes Historical Provider, MD  diphenhydrAMINE (SOMINEX) 25 MG tablet Take 25 mg by mouth at bedtime as needed for sleep.   Yes Historical Provider, MD  diphenoxylate-atropine (LOMOTIL) 2.5-0.025 MG per tablet Take 1 tablet by mouth 3 (three) times daily as needed. For diarrhea 06/30/14  Yes Estill Dooms, MD  fexofenadine (ALLEGRA) 180 MG tablet Take 180 mg by mouth at bedtime as needed for allergies.    Yes Historical Provider, MD  furosemide (LASIX) 40 MG tablet Take 1 tablet (40 mg total) by mouth daily. 04/03/14  Yes Irven Shelling, MD  HUMULIN 70/30 (70-30) 100 UNIT/ML injection Inject 40 Units into the skin  2 (two) times daily. 08/09/14  Yes Historical Provider, MD  metoprolol tartrate (LOPRESSOR) 25 MG tablet Take 1 tablet (25 mg total) by mouth 2 (two) times daily. 04/03/14  Yes Irven Shelling, MD  omeprazole (PRILOSEC) 20 MG capsule Take 20 mg by mouth daily.   Yes Historical Provider, MD  oxybutynin (DITROPAN) 5 MG tablet Take 5 mg by mouth 3 (three) times daily as needed for bladder spasms.   Yes Historical Provider, MD  Sennosides 25 MG TABS Take 25 mg by mouth daily as needed (for constipation).   Yes Historical Provider, MD   insulin aspart protamine- aspart (NOVOLOG MIX 70/30) (70-30) 100 UNIT/ML injection Inject 0.2 mLs (20 Units total) into the skin 2 (two) times daily with a meal. Patient taking differently: Inject 40 Units into the skin 2 (two) times daily with a meal.  06/29/14   Elmarie Shiley, MD  Misc. Devices (HEART RATE MONITOR) MISC 1 Device by Does not apply route as directed. 06/27/14   Brittainy Erie Noe, PA-C  nitroGLYCERIN (NITROSTAT) 0.4 MG SL tablet Place 0.4 mg under the tongue every 5 (five) minutes as needed for chest pain.    Historical Provider, MD   BP 155/74 mmHg  Pulse 73  Temp(Src) 97.4 F (36.3 C) (Rectal)  Resp 16  Ht 6\' 1"  (1.854 m)  Wt 349 lb (158.305 kg)  BMI 46.05 kg/m2  SpO2 99% Physical Exam  Constitutional: He appears well-developed and well-nourished. He appears lethargic. No distress.  HENT:  Head: Normocephalic and atraumatic.  Right Ear: External ear normal.  Left Ear: External ear normal.  Nose: Nose normal.  Mouth/Throat: Oropharynx is clear and moist.  Eyes: Conjunctivae and EOM are normal. Pupils are equal, round, and reactive to light. No scleral icterus.  Neck: Normal range of motion. Neck supple. No JVD present. No tracheal deviation present. No thyromegaly present.  Cardiovascular: Normal rate and intact distal pulses.  Exam reveals no gallop and no friction rub.   No murmur heard. Pulmonary/Chest: Effort normal and breath sounds normal. No stridor. No respiratory distress. He has no wheezes. He has no rales.  Abdominal: Soft. Bowel sounds are normal. He exhibits no distension and no mass. There is tenderness (mildly tender diffusely). There is no rebound and no guarding.  Genitourinary:  Indwelling catheter in place. No signs of trauma. No testicular swelling.   Musculoskeletal: Normal range of motion. He exhibits edema (swelling of bilateral lower extremities with weeping lesions). He exhibits no tenderness.  Neurological: He appears lethargic. No  cranial nerve deficit. He exhibits normal muscle tone. GCS eye subscore is 4. GCS verbal subscore is 4. GCS motor subscore is 6.  Oriented to person and year but not to place. Patient able to speak in full sentences but speaks very slowly and takes several seconds to think before responding to questions.   Skin: Skin is warm and dry. Rash (red macular rash to upper chest and right arm) noted. He is not diaphoretic. There is erythema. No pallor.  Nursing note and vitals reviewed.   ED Course  Procedures (including critical care time) Labs Review Labs Reviewed  COMPREHENSIVE METABOLIC PANEL - Abnormal; Notable for the following:    Sodium 135 (*)    Chloride 94 (*)    Glucose, Bld 209 (*)    All other components within normal limits  PRO B NATRIURETIC PEPTIDE - Abnormal; Notable for the following:    Pro B Natriuretic peptide (BNP) 1121.0 (*)    All other  components within normal limits  URINALYSIS, ROUTINE W REFLEX MICROSCOPIC - Abnormal; Notable for the following:    Specific Gravity, Urine 1.003 (*)    Hgb urine dipstick SMALL (*)    Protein, ur 30 (*)    Leukocytes, UA LARGE (*)    All other components within normal limits  URINE MICROSCOPIC-ADD ON - Abnormal; Notable for the following:    Squamous Epithelial / LPF FEW (*)    Bacteria, UA FEW (*)    All other components within normal limits  CBG MONITORING, ED - Abnormal; Notable for the following:    Glucose-Capillary 203 (*)    All other components within normal limits  URINE CULTURE  GRAM STAIN  CULTURE, BLOOD (ROUTINE X 2)  CULTURE, BLOOD (ROUTINE X 2)  CBC WITH DIFFERENTIAL  URINE RAPID DRUG SCREEN (HOSP PERFORMED)  AMMONIA  LIPASE, BLOOD  OCCULT BLOOD X 1 CARD TO LAB, STOOL  TSH  TROPONIN I  TROPONIN I  TROPONIN I    Imaging Review Dg Chest Port 1 View  08/27/2014   CLINICAL DATA:  Nausea and vomiting  EXAM: PORTABLE CHEST - 1 VIEW  COMPARISON:  06/25/2014  FINDINGS: Cardiac shadow is mildly enlarged but  stable. Mild vascular congestion is seen accentuated by the poor inspiratory effort. No bony abnormality is noted.  IMPRESSION: Mild CHF.   Electronically Signed   By: Inez Catalina M.D.   On: 08/27/2014 16:27   Dg Abd Portable 1v  08/27/2014   CLINICAL DATA:  No bowel movements for 5 days with nausea and vomiting  EXAM: PORTABLE ABDOMEN - 1 VIEW  COMPARISON:  None.  FINDINGS: Scattered large and small bowel gas is noted. Fecal material is noted within the colon predominately in the ascending colon. No impaction is seen. No free air is noted. No bony abnormality is seen.  IMPRESSION: Moderate stool burden.  No obstructive changes are seen.   Electronically Signed   By: Inez Catalina M.D.   On: 08/27/2014 16:28     EKG Interpretation None      MDM   Final diagnoses:  Altered mental status  Abdominal pain    The patient is a 68 y.o. Male with IDDM, Neurogenic bladder with indwelling catheter, and hx of stroke who presents via EMS for AMS consisting of lethargy and confusion. EMS reports patient lives by himself and his house was very messy and cluttered. Exam as above. Fresh catheter placed and UA taken which shows + leuks and bacteria. Culture sent but in mean time feel benefit of treating with ceftriaxone outweighs risks. Patient started on abx and admitted to hospitalist for further treatment. Patient and friend updated on plan and express understanding and agreement with plan.  Patient seen with attending, Dr. Canary Brim, who oversaw clinical decision making.     Margaretann Loveless, MD 08/27/14 [place], MD 08/27/14 [place], MD 08/27/14 2000

## 2014-08-27 NOTE — ED Notes (Signed)
To ED via [place] from [place] -- lives alone-- poor hygiene, paramedics state that home is in disarray, appears to be a hoarding situation. Pt called his friend and then wouldn't/couldn't talk on the phone. When friend got to patient's house, pt was nonverbal, upon EMS arrival, pt would follow some commands, when attempting to look at pupils -- would hold eyes tightly shut for paramedics. Upon arrival-- will open eyes, will

## 2014-08-27 NOTE — ED Notes (Signed)
Admitting MD at bedside.

## 2014-08-27 NOTE — ED Notes (Signed)
[name] [phone], is the closest family friend to patient, if needed for patient. Next of kin is in [place]

## 2014-08-27 NOTE — ED Notes (Addendum)
Friend at bedside: reports patient has episodes lasting a few seconds at a time in which patient with become nonverbal. During assessment, patient was talking and had completed swallow evaluation, then appeared to fall asleep and was nonverbal for ~4-5 seconds. Pt awoke and was oriented x 4. Friend reports multiple, similar episodes in the past

## 2014-08-27 NOTE — ED Notes (Signed)
MRI unable to scan patient. Notified accepting RN

## 2014-08-27 NOTE — H&P (Addendum)
[facility] History and Physical  [name] [id number] DOB: 01-13-1946 DOA: 08/27/2014  Referring physician: ED physician.  PCP: Irven Shelling, MD   Chief Complaint: AMS.  HPI: [name] is a 68 y.o. male with PMH significant for frequent syncope events, UTI, chronic indwelling foley catheter, Diabetes, HTN, who presents with AMS. Patient was talking with friends over the phone, patient only able to say a few words. Friend went to the house and found patient confuse, lethargic.  Patient with poor living condition. Patient has become more alert in the ED.  Evaluation: UA with large leukocytes, WBC 11-20. UDS negative. Chest X ray with mild CHF.  Patient more alert but still slowly answering questions. He is complaining of chest pain. No BM last week. He relates lower quadrant abdominal pain.     Review of Systems:  Negative, except as per HPI.   Past Medical History  Diagnosis Date  . Obese   . Hypertension   . Diabetes mellitus   . Diabetic diarrhea   . Sleep apnea   . Diabetic neuropathy, painful   . Chronic indwelling Foley catheter   . Bladder spasms   . Complication of anesthesia   . PONV (postoperative nausea and vomiting)   . Hyperlipidemia   . GERD (gastroesophageal reflux disease)   . Neurogenic bladder   . Stroke 2013   Past Surgical History  Procedure Laterality Date  . Right tkr    . Gastroplasty    . Cholecystectomy    . Total knee arthroplasty    . Left heart catheterization with coronary angiogram N/A 10/24/2011    Procedure: LEFT HEART CATHETERIZATION WITH CORONARY ANGIOGRAM;  Surgeon: Candee Furbish, MD;  Location: [facility] CATH LAB;  Service: Cardiovascular;  Laterality: N/A;  right radial artery approach   Social History:  reports that he quit smoking about 37 years ago. He has never used smokeless tobacco. He reports that he does not drink alcohol or use illicit drugs.  Allergies  Allergen Reactions  . Ace Inhibitors Swelling    . Lipitor [Atorvastatin Calcium] Swelling  . Metformin And Related Swelling  . Cefadroxil Hives  . Cephalexin Hives  . Morphine And Related Other (See Comments)    Sweating, feels like is "in rocky boat."  . Robaxin [Methocarbamol] Other (See Comments)    Feels like he is shaky    Family History  Problem Relation Age of Onset  . Hypertension Mother   . Diabetes type I Father   . Cancer Brother      Testicular Cancer  . Cancer Brother     Leukemia     Prior to Admission medications   Medication Sig Start Date End Date Taking? Authorizing Provider  aspirin EC 81 MG tablet Take 81 mg by mouth every morning.    Yes Historical Provider, MD  diphenhydrAMINE (SOMINEX) 25 MG tablet Take 25 mg by mouth at bedtime as needed for sleep.   Yes Historical Provider, MD  diphenoxylate-atropine (LOMOTIL) 2.5-0.025 MG per tablet Take 1 tablet by mouth 3 (three) times daily as needed. For diarrhea 06/30/14  Yes Estill Dooms, MD  fexofenadine (ALLEGRA) 180 MG tablet Take 180 mg by mouth at bedtime as needed for allergies.    Yes Historical Provider, MD  furosemide (LASIX) 40 MG tablet Take 1 tablet (40 mg total) by mouth daily. 04/03/14  Yes Irven Shelling, MD  HUMULIN 70/30 (70-30) 100 UNIT/ML injection Inject 40 Units into the skin 2 (two) times daily. 08/09/14  Yes Historical Provider, MD  metoprolol tartrate (LOPRESSOR) 25 MG tablet Take 1 tablet (25 mg total) by mouth 2 (two) times daily. 04/03/14  Yes Irven Shelling, MD  omeprazole (PRILOSEC) 20 MG capsule Take 20 mg by mouth daily.   Yes Historical Provider, MD  oxybutynin (DITROPAN) 5 MG tablet Take 5 mg by mouth 3 (three) times daily as needed for bladder spasms.   Yes Historical Provider, MD  Sennosides 25 MG TABS Take 25 mg by mouth daily as needed (for constipation).   Yes Historical Provider, MD  insulin aspart protamine- aspart (NOVOLOG MIX 70/30) (70-30) 100 UNIT/ML injection Inject 0.2 mLs (20 Units total) into the skin 2  (two) times daily with a meal. Patient taking differently: Inject 40 Units into the skin 2 (two) times daily with a meal.  06/29/14   Elmarie Shiley, MD  Misc. Devices (HEART RATE MONITOR) MISC 1 Device by Does not apply route as directed. 06/27/14   Brittainy Erie Noe, PA-C  nitroGLYCERIN (NITROSTAT) 0.4 MG SL tablet Place 0.4 mg under the tongue every 5 (five) minutes as needed for chest pain.    Historical Provider, MD   Physical Exam: Filed Vitals:   08/27/14 1745 08/27/14 1815 08/27/14 1830 08/27/14 1839  BP: 135/62 139/62 155/74   Pulse: 74 78 73   Temp:    97.7 F (36.5 C)  TempSrc:      Resp: 12 16 16    Height:      Weight:      SpO2: 99% 100% 99%     Wt Readings from Last 3 Encounters:  08/27/14 158.305 kg (349 lb)  07/20/14 158.305 kg (349 lb)  07/18/14 161.027 kg (355 lb)    General:  Appears calm and comfortable Eyes: PERRL, normal lids, irises & conjunctiva ENT: grossly normal hearing, lips & tongue Neck: no LAD, masses or thyromegaly Cardiovascular: RRR, no m/r/g. No LE edema. Telemetry: SR, no arrhythmias  Respiratory: CTA bilaterally, no w/r/r. Normal respiratory effort. Abdomen: soft, ND, mild lower quadrant tenderness.  Skin: bilateral ulcers, rash escoriation LE.  Musculoskeletal: grossly normal tone /BLE Neurologic: Alert oriented to person and place, slowly answering questions, left upper extremity weakness, flaccid, unclear if is effort, move bilateral LE. Speech clear.            Labs on Admission:  Basic Metabolic Panel:  Recent Labs Lab 08/27/14 1548  NA 135*  K 4.2  CL 94*  CO2 27  GLUCOSE 209*  BUN 11  CREATININE 0.64  CALCIUM 9.3   Liver Function Tests:  Recent Labs Lab 08/27/14 1548  AST 12  ALT 10  ALKPHOS 112  BILITOT 0.5  PROT 8.0  ALBUMIN 3.5    Recent Labs Lab 08/27/14 1548  LIPASE 18    Recent Labs Lab 08/27/14 1600  AMMONIA 15   CBC:  Recent Labs Lab 08/27/14 1548  WBC 8.5  NEUTROABS 6.2  HGB  15.0  HCT 44.1  MCV 87.2  PLT 215   Cardiac Enzymes: No results for input(s): CKTOTAL, CKMB, CKMBINDEX, TROPONINI in the last 168 hours.  BNP (last 3 results)  Recent Labs  03/21/14 1551 04/01/14 1431 08/27/14 1548  PROBNP 2003.0* 658.6* 1121.0*   CBG:  Recent Labs Lab 08/27/14 1644  GLUCAP 203*    Radiological Exams on Admission: Dg Chest Port 1 View  08/27/2014   CLINICAL DATA:  Nausea and vomiting  EXAM: PORTABLE CHEST - 1 VIEW  COMPARISON:  06/25/2014  FINDINGS: Cardiac shadow is  mildly enlarged but stable. Mild vascular congestion is seen accentuated by the poor inspiratory effort. No bony abnormality is noted.  IMPRESSION: Mild CHF.   Electronically Signed   By: Inez Catalina M.D.   On: 08/27/2014 16:27   Dg Abd Portable 1v  08/27/2014   CLINICAL DATA:  No bowel movements for 5 days with nausea and vomiting  EXAM: PORTABLE ABDOMEN - 1 VIEW  COMPARISON:  None.  FINDINGS: Scattered large and small bowel gas is noted. Fecal material is noted within the colon predominately in the ascending colon. No impaction is seen. No free air is noted. No bony abnormality is seen.  IMPRESSION: Moderate stool burden.  No obstructive changes are seen.   Electronically Signed   By: Inez Catalina M.D.   On: 08/27/2014 16:28    EKG: Independently reviewed. 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   Assessment/Plan Active Problems:   UTI (lower urinary tract infection)   CAD- known RCA disease- medical Rx   Obstipation   Chronic indwelling Foley catheter   Chronic diastolic CHF (congestive heart failure)   Altered mental status   Encephalopathy acute  1-Acute Encephalopathy; probably related to infectious process, UTI. But at risk for stroke. Will check MRI brain, EEG. Will treat infectious process. NPO due to AMS.   2-UTI; Continue with ceftriaxone. Follow urine culture.   3-Chronic diastolic HF; compensated. Resume lasix in am.   4-Chest Pain: ECK, cycle cardiac enzymes. Admit to telemetry.  Nitroglycerin PRN.   5-Diabetes; SSI while NPO.   6-Constipation; fleet enema, miralax. KUB with moderate stool burden.  7-Bilateral LE wound: wound care consulted.  8-Social issue, living condition: need SW consult, might need placement.   Code Status: full code.  DVT Prophylaxis; Lovenox.  Family Communication: friend at bedside.  Disposition Plan: expect 3 to 4 days inpatient.   Time spent: 75 minutes.   Niel Hummer A [facility] Pager [phone]

## 2014-08-28 ENCOUNTER — Inpatient Hospital Stay (HOSPITAL_COMMUNITY): Payer: Medicare Other

## 2014-08-28 ENCOUNTER — Encounter (HOSPITAL_COMMUNITY): Payer: Self-pay | Admitting: *Deleted

## 2014-08-28 DIAGNOSIS — N390 Urinary tract infection, site not specified: Principal | ICD-10-CM

## 2014-08-28 DIAGNOSIS — R410 Disorientation, unspecified: Secondary | ICD-10-CM | POA: Insufficient documentation

## 2014-08-28 LAB — URINE CULTURE: Colony Count: 75000

## 2014-08-28 LAB — GLUCOSE, CAPILLARY
GLUCOSE-CAPILLARY: 171 mg/dL — AB (ref 70–99)
GLUCOSE-CAPILLARY: 212 mg/dL — AB (ref 70–99)
GLUCOSE-CAPILLARY: 209 mg/dL — AB (ref 70–99)
GLUCOSE-CAPILLARY: 272 mg/dL — AB (ref 70–99)
Glucose-Capillary: 192 mg/dL — ABNORMAL HIGH (ref 70–99)

## 2014-08-28 LAB — TROPONIN I: Troponin I: <0.3 ng/mL (ref <0.30)

## 2014-08-28 LAB — MRSA PCR SCREENING: MRSA by PCR: POSITIVE — AB

## 2014-08-28 MED ORDER — VANCOMYCIN HCL 10 G IV SOLR
1500.0000 mg | Freq: Two times a day (BID) | INTRAVENOUS | Status: DC
  Administered 2014-08-28 – 2014-08-29 (×2): 1500 mg via INTRAVENOUS
  Filled 2014-08-28 (×3): qty 1500

## 2014-08-28 MED ORDER — INSULIN ASPART PROT & ASPART (70-30 MIX) 100 UNIT/ML ~~LOC~~ SUSP
30.0000 [IU] | Freq: Two times a day (BID) | SUBCUTANEOUS | Status: DC
  Administered 2014-08-28 – 2014-08-31 (×6): 30 [IU] via SUBCUTANEOUS
  Filled 2014-08-28: qty 10

## 2014-08-28 MED ORDER — HALOPERIDOL LACTATE 5 MG/ML IJ SOLN
1.0000 mg | Freq: Once | INTRAMUSCULAR | Status: AC
  Administered 2014-08-28: 1 mg via INTRAVENOUS
  Filled 2014-08-28: qty 1

## 2014-08-28 MED ORDER — CHLORHEXIDINE GLUCONATE CLOTH 2 % EX PADS
6.0000 | MEDICATED_PAD | Freq: Every day | CUTANEOUS | Status: DC
  Administered 2014-08-28 – 2014-08-31 (×4): 6 via TOPICAL

## 2014-08-28 MED ORDER — MUPIROCIN 2 % EX OINT
1.0000 "application " | TOPICAL_OINTMENT | Freq: Two times a day (BID) | CUTANEOUS | Status: DC
  Administered 2014-08-28 – 2014-08-31 (×7): 1 via NASAL
  Filled 2014-08-28: qty 22

## 2014-08-28 MED ORDER — VANCOMYCIN HCL 10 G IV SOLR
2500.0000 mg | Freq: Once | INTRAVENOUS | Status: AC
  Administered 2014-08-28: 2500 mg via INTRAVENOUS
  Filled 2014-08-28: qty 2500

## 2014-08-28 MED ORDER — ENOXAPARIN SODIUM 40 MG/0.4ML ~~LOC~~ SOLN
40.0000 mg | SUBCUTANEOUS | Status: DC
  Filled 2014-08-28: qty 0.4

## 2014-08-28 MED ORDER — ENOXAPARIN SODIUM 80 MG/0.8ML ~~LOC~~ SOLN
80.0000 mg | SUBCUTANEOUS | Status: DC
  Administered 2014-08-28 – 2014-08-30 (×3): 80 mg via SUBCUTANEOUS
  Filled 2014-08-28 (×4): qty 0.8

## 2014-08-28 NOTE — Progress Notes (Signed)
CRITICAL VALUE ALERT  Critical value received: +PCR MRSA  Date of notification:  08/28/14  Time of notification:  0106  Critical value read back:Yes.    Nurse who received alert:  Harl Favor  MD notified (1st page):  S. Alene Mires  Time of first page:  0114  MD notified (2nd page):  Time of second page:  Responding MD:  Jyl Heinz  Time MD responded:  [id number]  +MRSA PCR order set initiated

## 2014-08-28 NOTE — Progress Notes (Signed)
EEG Completed; Results Pending  

## 2014-08-28 NOTE — Progress Notes (Signed)
PROGRESS NOTE  [name] [id number] DOB: 05/10/1946 DOA: 08/27/2014 PCP: Irven Shelling, MD  HPI: 68 y.o. male with PMH significant for frequent syncope events, UTI, chronic indwelling foley catheter, Diabetes, HTN, who presents with AMS.   Subjective / 24 H Interval events Much better this morning, mental status has returned to normal. Patient has little to no recollection over the last 24 hours  Assessment/Plan: Active Problems:   UTI (lower urinary tract infection)   CAD- known RCA disease- medical Rx   Obstipation   Chronic indwelling Foley catheter   Chronic diastolic CHF (congestive heart failure)   Altered mental status   Encephalopathy acute   Confusion   Acute encephalopathy - most likely related to his urinary tract infection. He has a history of recurrent urinary tract infections, her previous microbiology without any highly drug-resistant bacteria. Continue IV ceftriaxone for now and awaiting culture results. His MRI was negative for CVA.  UTI - continue ceftriaxone, follow urine cultures. - He has a chronic Foley catheter and is followed by Dr. Alinda Money from [facility]  Chronic diastolic heart failure - he appears euvolemic, continue home medications  Chest pain on admission - resolved, cardiac enzymes negative.  Insulin-dependent diabetes - with complications, patient has peripheral neuropathy. Most recent hemoglobin A1c was 8.9 on 06/25/2014 showing poor control. Continue 70/30 insulin regimen. Patient refusing insulin this morning.  Lower extremity wounds - patient has multiple wounds the left pretibial and right pretibial area, he has no sensation there and doesn't have a good insight into how he gets these. He has 2 superficial ulcerations above the right Achilles tendon on the right. Appreciate wound care consultation.  Sacral pressure superficial ulcer on right buttock - no evidence of infection, skin breakdown, this was present on  admission  - Patient highly deconditioned, PT evaluation pending.  Constipation - patient was able to have a bowel movement   Diet: Diet Carb Modified Fluids: none DVT Prophylaxis: Lovenox  Code Status: Full Code Family Communication: d/w patient  Disposition Plan: remain inpatient  Consultants:  None   Procedures:  None    Antibiotics Ceftriaxone 12/18>>   Studies  Dg Chest Port 1 View  08/27/2014   CLINICAL DATA:  Nausea and vomiting  EXAM: PORTABLE CHEST - 1 VIEW  COMPARISON:  06/25/2014  FINDINGS: Cardiac shadow is mildly enlarged but stable. Mild vascular congestion is seen accentuated by the poor inspiratory effort. No bony abnormality is noted.  IMPRESSION: Mild CHF.   Electronically Signed   By: Inez Catalina M.D.   On: 08/27/2014 16:27   Dg Abd Portable 1v  08/27/2014   CLINICAL DATA:  No bowel movements for 5 days with nausea and vomiting  EXAM: PORTABLE ABDOMEN - 1 VIEW  COMPARISON:  None.  FINDINGS: Scattered large and small bowel gas is noted. Fecal material is noted within the colon predominately in the ascending colon. No impaction is seen. No free air is noted. No bony abnormality is seen.  IMPRESSION: Moderate stool burden.  No obstructive changes are seen.   Electronically Signed   By: Inez Catalina M.D.   On: 08/27/2014 16:28     Objective  Filed Vitals:   08/27/14 1830 08/27/14 1839 08/27/14 2054 08/28/14 0815  BP: 155/74  126/54 152/69  Pulse: 73  81 77  Temp:  97.7 F (36.5 C) 98 F (36.7 C) 98.1 F (36.7 C)  TempSrc:   Oral Oral  Resp: 16  18 20   Height:   6'  1" (1.854 m)   Weight:      SpO2: 99%  98% 97%    Intake/Output Summary (Last 24 hours) at 08/28/14 1228 Last data filed at 08/28/14 [id number]  Gross per 24 hour  Intake      0 ml  Output   1271 ml  Net  -1271 ml   Filed Weights   08/27/14 1639  Weight: 158.305 kg (349 lb)   Exam:  General:  Pleasant obese Caucasian male in no apparent distress   Cardiovascular: Regular rate  and rhythm, no murmurs rubs or gallops   Respiratory: Clear to auscultation bilaterally, no wheezing, moves air well   Abdomen: Obese, soft, mild tenderness to palpation bilateral lower quadrants   MSK: No peripheral edema. Chronic venous stasis changes bilateral lower extremities with multiple in various stages of healing ulcerations in the pretibial areas bilaterally, without any intensive local cellulitis, superficial pressure ulcer on the right buttock   Neuro: Nonfocal   Data Reviewed: Basic Metabolic Panel:  Recent Labs Lab 08/27/14 1548  NA 135*  K 4.2  CL 94*  CO2 27  GLUCOSE 209*  BUN 11  CREATININE 0.64  CALCIUM 9.3   Liver Function Tests:  Recent Labs Lab 08/27/14 1548  AST 12  ALT 10  ALKPHOS 112  BILITOT 0.5  PROT 8.0  ALBUMIN 3.5    Recent Labs Lab 08/27/14 1548  LIPASE 18    Recent Labs Lab 08/27/14 1600  AMMONIA 15   CBC:  Recent Labs Lab 08/27/14 1548  WBC 8.5  NEUTROABS 6.2  HGB 15.0  HCT 44.1  MCV 87.2  PLT 215   Cardiac Enzymes:  Recent Labs Lab 08/27/14 1845 08/28/14 0031 08/28/14 0840  TROPONINI <0.30 <0.30 <0.30   BNP (last 3 results)  Recent Labs  03/21/14 1551 04/01/14 1431 08/27/14 1548  PROBNP 2003.0* 658.6* 1121.0*   CBG:  Recent Labs Lab 08/27/14 1644 08/27/14 2237 08/28/14 0806 08/28/14 1208  GLUCAP 203* 171* 212* 209*    Recent Results (from the past 240 hour(s))  Urine culture     Status: None (Preliminary result)   Collection Time: 08/27/14  4:35 PM  Result Value Ref Range Status   Specimen Description URINE, RANDOM  Final   Special Requests BAGGED URINE  Final   Colony Count PENDING  Incomplete   Culture PENDING  Incomplete   Report Status PENDING  Incomplete  Gram stain     Status: None   Collection Time: 08/27/14  4:36 PM  Result Value Ref Range Status   Specimen Description URINE, RANDOM  Final   Special Requests BAGGED URINE  Final   Gram Stain   Final    CYTOSPIN PREP WBC  PRESENT,BOTH PMN AND MONONUCLEAR GRAM POSITIVE COCCI IN PAIRS IN CLUSTERS GRAM NEGATIVE RODS    Report Status 08/27/2014 FINAL  Final  MRSA PCR Screening     Status: Abnormal   Collection Time: 08/27/14  9:09 PM  Result Value Ref Range Status   MRSA by PCR POSITIVE (A) NEGATIVE Final    Comment:        The GeneXpert MRSA Assay (FDA approved for NASAL specimens only), is one component of a comprehensive MRSA colonization surveillance program. It is not intended to diagnose MRSA infection nor to guide or monitor treatment for MRSA infections. RESULT CALLED TO, READ BACK BY AND VERIFIED WITH: R.PEARCE ,RN AT 0102 BY L.PITT 08/28/14      Scheduled Meds: . aspirin EC  81 mg Oral q  morning - 10a  . Chlorhexidine Gluconate Cloth  6 each Topical Q0600  . docusate sodium  100 mg Oral BID  . enoxaparin (LOVENOX) injection  30 mg Subcutaneous Q24H  . insulin aspart  0-9 Units Subcutaneous TID WC  . insulin aspart protamine- aspart  30 Units Subcutaneous BID WC  . metoprolol tartrate  25 mg Oral BID  . mupirocin ointment  1 application Nasal BID  . pantoprazole  40 mg Oral Daily  . polyethylene glycol  17 g Oral BID  . sodium chloride  3 mL Intravenous Q12H  . sodium phosphate  1 enema Rectal Once  . vancomycin  1,500 mg Intravenous Q12H   Continuous Infusions:   Time spent: 35 minutes  Marzetta Board, MD [facility] Pager [phone]. If 7 PM - 7 AM, please contact night-coverage at [email or web address], password [facility] 08/28/2014, 12:28 PM  LOS: 1 day

## 2014-08-28 NOTE — Procedures (Signed)
ELECTROENCEPHALOGRAM REPORT   Patient: [name]       Room #: [id number] EEG No. ID: [id number] Age: 68 y.o.        Sex: male Referring Physician: Cruzita Lederer Report Date:  08/28/2014        Interpreting Physician: Alexis Goodell  History: [name] is an 68 y.o. male with recurrent syncope  Medications:  Scheduled: . aspirin EC  81 mg Oral q morning - 10a  . Chlorhexidine Gluconate Cloth  6 each Topical Q0600  . docusate sodium  100 mg Oral BID  . enoxaparin (LOVENOX) injection  30 mg Subcutaneous Q24H  . insulin aspart  0-9 Units Subcutaneous TID WC  . insulin aspart protamine- aspart  30 Units Subcutaneous BID WC  . metoprolol tartrate  25 mg Oral BID  . mupirocin ointment  1 application Nasal BID  . pantoprazole  40 mg Oral Daily  . polyethylene glycol  17 g Oral BID  . sodium chloride  3 mL Intravenous Q12H  . sodium phosphate  1 enema Rectal Once  . vancomycin  1,500 mg Intravenous Q12H    Conditions of Recording:  This is a 16 channel EEG carried out with the patient in the awake and drowsy states.  Description:  The waking background activity consists of a low voltage, symmetrical, fairly well organized, 10 Hz alpha activity, seen from the parieto-occipital and posterior temporal regions.  Low voltage fast activity, poorly organized, is seen anteriorly and is at times superimposed on more posterior regions.  A mixture of theta and alpha rhythms are seen from the central and temporal regions. The patient drowses with slowing to irregular, low voltage theta and beta activity.   Stage II sleep is not obtained. No epileptiform activity is noted. Hyperventilation was not performed.  Intermittent photic stimulation was performed but failed to illicit any change in the tracing.    IMPRESSION: Normal electroencephalogram, awake, drowsy and with activation procedures. There are no focal lateralizing or epileptiform features.   Alexis Goodell, MD [facility] [phone] 08/28/2014, 11:50 AM

## 2014-08-28 NOTE — Progress Notes (Signed)
ANTIBIOTIC CONSULT NOTE - INITIAL  Pharmacy Consult for vancomycin Indication: UTI  Allergies  Allergen Reactions  . Ace Inhibitors Swelling  . Lipitor [Atorvastatin Calcium] Swelling  . Metformin And Related Swelling  . Cefadroxil Hives  . Cephalexin Hives  . Morphine And Related Other (See Comments)    Sweating, feels like is "in rocky boat."  . Robaxin [Methocarbamol] Other (See Comments)    Feels like he is shaky    Patient Measurements: Height: 6\' 1"  (185.4 cm) Weight: (!) 349 lb (158.305 kg) IBW/kg (Calculated) : 79.9  Vital Signs: Temp: 98 F (36.7 C) (12/18 2054) Temp Source: Oral (12/18 2054) BP: 126/54 mmHg (12/18 2054) Pulse Rate: 81 (12/18 2054)  Labs:  Recent Labs  08/27/14 1548  WBC 8.5  HGB 15.0  PLT 215  CREATININE 0.64   Estimated Creatinine Clearance: 143 mL/min (by C-G formula based on Cr of 0.64).    Microbiology: Recent Results (from the past 720 hour(s))  Urine culture     Status: None (Preliminary result)   Collection Time: 08/27/14  4:35 PM  Result Value Ref Range Status   Specimen Description URINE, RANDOM  Final   Special Requests BAGGED URINE  Final   Colony Count PENDING  Incomplete   Culture PENDING  Incomplete   Report Status PENDING  Incomplete  Gram stain     Status: None   Collection Time: 08/27/14  4:36 PM  Result Value Ref Range Status   Specimen Description URINE, RANDOM  Final   Special Requests BAGGED URINE  Final   Gram Stain   Final    CYTOSPIN PREP WBC PRESENT,BOTH PMN AND MONONUCLEAR GRAM POSITIVE COCCI IN PAIRS IN CLUSTERS GRAM NEGATIVE RODS    Report Status 08/27/2014 FINAL  Final  MRSA PCR Screening     Status: Abnormal   Collection Time: 08/27/14  9:09 PM  Result Value Ref Range Status   MRSA by PCR POSITIVE (A) NEGATIVE Final    Comment:        The GeneXpert MRSA Assay (FDA approved for NASAL specimens only), is one component of a comprehensive MRSA colonization surveillance program. It is  not intended to diagnose MRSA infection nor to guide or monitor treatment for MRSA infections. RESULT CALLED TO, READ BACK BY AND VERIFIED WITH: R.PEARCE ,RN AT 0102 BY L.PITT 08/28/14     Medical History: Past Medical History  Diagnosis Date  . Obese   . Hypertension   . Diabetes mellitus   . Diabetic diarrhea   . Sleep apnea   . Diabetic neuropathy, painful   . Chronic indwelling Foley catheter   . Bladder spasms   . Complication of anesthesia   . PONV (postoperative nausea and vomiting)   . Hyperlipidemia   . GERD (gastroesophageal reflux disease)   . Neurogenic bladder   . Stroke 2013    Medications:  Prescriptions prior to admission  Medication Sig Dispense Refill Last Dose  . aspirin EC 81 MG tablet Take 81 mg by mouth every morning.    08/27/2014 at Unknown time  . diphenhydrAMINE (SOMINEX) 25 MG tablet Take 25 mg by mouth at bedtime as needed for sleep.   Past Week at Unknown time  . diphenoxylate-atropine (LOMOTIL) 2.5-0.025 MG per tablet Take 1 tablet by mouth 3 (three) times daily as needed. For diarrhea 90 tablet 0 Past Week at Unknown time  . fexofenadine (ALLEGRA) 180 MG tablet Take 180 mg by mouth at bedtime as needed for allergies.    08/26/2014  at Unknown time  . furosemide (LASIX) 40 MG tablet Take 1 tablet (40 mg total) by mouth daily. 30 tablet 11 08/27/2014 at Unknown time  . HUMULIN 70/30 (70-30) 100 UNIT/ML injection Inject 40 Units into the skin 2 (two) times daily.  12 08/27/2014 at Unknown time  . metoprolol tartrate (LOPRESSOR) 25 MG tablet Take 1 tablet (25 mg total) by mouth 2 (two) times daily. 60 tablet 11 Past Week at 2200  . omeprazole (PRILOSEC) 20 MG capsule Take 20 mg by mouth daily.   08/27/2014 at Unknown time  . oxybutynin (DITROPAN) 5 MG tablet Take 5 mg by mouth 3 (three) times daily as needed for bladder spasms.   Past Month at Unknown time  . Sennosides 25 MG TABS Take 25 mg by mouth daily as needed (for constipation).   Past Week at  Unknown time  . insulin aspart protamine- aspart (NOVOLOG MIX 70/30) (70-30) 100 UNIT/ML injection Inject 0.2 mLs (20 Units total) into the skin 2 (two) times daily with a meal. (Patient taking differently: Inject 40 Units into the skin 2 (two) times daily with a meal. ) 10 mL 11   . Misc. Devices (HEART RATE MONITOR) MISC 1 Device by Does not apply route as directed. 1 each 0   . nitroGLYCERIN (NITROSTAT) 0.4 MG SL tablet Place 0.4 mg under the tongue every 5 (five) minutes as needed for chest pain.   not used   Scheduled:  . aspirin EC  81 mg Oral q morning - 10a  . Chlorhexidine Gluconate Cloth  6 each Topical Q0600  . docusate sodium  100 mg Oral BID  . enoxaparin (LOVENOX) injection  30 mg Subcutaneous Q24H  . insulin aspart  0-9 Units Subcutaneous TID WC  . metoprolol tartrate  25 mg Oral BID  . mupirocin ointment  1 application Nasal BID  . pantoprazole  40 mg Oral Daily  . polyethylene glycol  17 g Oral BID  . sodium chloride  3 mL Intravenous Q12H  . sodium phosphate  1 enema Rectal Once    Assessment: 68yo male admitted for AMS, showing GPC and GNR on urine gram stain, to broaden IV ABX, already on Levaquin.  Goal of Therapy:  Vancomycin trough level 10-15 mcg/ml  Plan:  Will give vancomycin 2500mg  IV x1 followed by 1500mg  IV Q12H and monitor CBC, Cx, levels prn.  Wynona Neat, PharmD, BCPS  08/28/2014,7:12 AM

## 2014-08-28 NOTE — Progress Notes (Signed)
Notified MD that pt was unable to remain still for MRI scan, prior to arrival on unit, despite having had IV ativan administered.

## 2014-08-29 LAB — BASIC METABOLIC PANEL
ANION GAP: 11 (ref 5–15)
BUN: 10 mg/dL (ref 6–23)
CALCIUM: 8.8 mg/dL (ref 8.4–10.5)
CO2: 25 mEq/L (ref 19–32)
CREATININE: 0.66 mg/dL (ref 0.50–1.35)
Chloride: 99 mEq/L (ref 96–112)
GFR calc Af Amer: >90 mL/min (ref >90)
Glucose, Bld: 152 mg/dL — ABNORMAL HIGH (ref 70–99)
Potassium: 4.2 mEq/L (ref 3.7–5.3)
Sodium: 135 mEq/L — ABNORMAL LOW (ref 137–147)

## 2014-08-29 LAB — CBC
HCT: 41.3 % (ref 39.0–52.0)
Hemoglobin: 13.6 g/dL (ref 13.0–17.0)
MCH: 28.9 pg (ref 26.0–34.0)
MCHC: 32.9 g/dL (ref 30.0–36.0)
MCV: 87.9 fL (ref 78.0–100.0)
PLATELETS: 218 10*3/uL (ref 150–400)
RBC: 4.7 MIL/uL (ref 4.22–5.81)
RDW: 13.5 % (ref 11.5–15.5)
WBC: 7.1 10*3/uL (ref 4.0–10.5)

## 2014-08-29 LAB — GLUCOSE, CAPILLARY
GLUCOSE-CAPILLARY: 139 mg/dL — AB (ref 70–99)
GLUCOSE-CAPILLARY: 173 mg/dL — AB (ref 70–99)
Glucose-Capillary: 134 mg/dL — ABNORMAL HIGH (ref 70–99)
Glucose-Capillary: 142 mg/dL — ABNORMAL HIGH (ref 70–99)
Glucose-Capillary: 157 mg/dL — ABNORMAL HIGH (ref 70–99)
Glucose-Capillary: 184 mg/dL — ABNORMAL HIGH (ref 70–99)

## 2014-08-29 MED ORDER — SULFAMETHOXAZOLE-TRIMETHOPRIM 800-160 MG PO TABS
1.0000 | ORAL_TABLET | Freq: Two times a day (BID) | ORAL | Status: DC
  Administered 2014-08-29 – 2014-08-31 (×5): 1 via ORAL
  Filled 2014-08-29 (×6): qty 1

## 2014-08-29 NOTE — Progress Notes (Signed)
Pt. Encouraged entire shift to reposition/turn on side. Pt. Likes to remain supine and states "No that's Ok" Pt. Educated about pressure ulcers. Foam intact.

## 2014-08-29 NOTE — Evaluation (Signed)
Physical Therapy Evaluation Patient Details Name: [name] MRN: [id number] DOB: [birth year] Today's Date: 08/29/2014   History of Present Illness  68 y.o. male with PMH significant for frequent syncope events, UTI, chronic indwelling foley catheter, Diabetes, HTN, who presents with AMS.     Clinical Impression  Pt presents with significant limitations to functional mobility related to chronic inactivity, obesity, and unpredictable episodes of 'blankness' and ?presyncope.  Note EEG negative, unsure how to explain signs and symptoms observed.  Orthostatic bp assessed by nursing with drop from 157-145 in SBP but otherwise not indicative of orthostasis.  Recommend CIR consult to assess for potential admission.  Pt demonstrates ability to progress back to modified independent.  Recommend [name] to assist with socio-economic issues (lives alone with little help for daily ADL and housekeeping), has used all SNF days per patient so concerned about insurance coverage.  WIll initiate PT in acute setting, encourage nursing to help with EOB and OOB to [facility] or Chair.  PLEASE ORDER BARIATRIC BSC for use in room.       Follow Up Recommendations CIR    Equipment Recommendations  None recommended by PT    Recommendations for Other Services Rehab consult     Precautions / Restrictions Precautions Precautions: Fall Precaution Comments: obesity, weakness and episodes of ??presyncope vs. other etiology... 2 person assist use bariatric equipment, sit up EOB as much as tolerates Restrictions Weight Bearing Restrictions: No Other Position/Activity Restrictions: on bari air bed      Mobility  Bed Mobility Overal bed mobility: Needs Assistance Bed Mobility: Supine to Sit;Sit to Supine     Supine to sit: Min assist;HOB elevated Sit to supine: Min assist;HOB elevated   General bed mobility comments: uses rails and needs HOB up to move, able to get to/from EOB with little physical help but requires  supervisoin or better (and hands on for safety first time) to accomplish safely  Transfers Overall transfer level: Needs assistance Equipment used: Rolling walker (2 wheeled) Transfers: Sit to/from Stand Sit to Stand: +2 safety/equipment;From elevated surface         General transfer comment: Stood x3 total, initially with RN +2 help for liftoff and safety given unpredictable arousal... progresses to +1 assist and able to scoot to EOB and assist to transition to RW.  Stood for 30 sec max each trial progressing to wt shifting and stepping in place before attempting 2-3 steps fwd/backward (see gait)  Ambulation/Gait Ambulation/Gait assistance: Min assist Ambulation Distance (Feet): 5 Feet Assistive device: Rolling walker (2 wheeled) Gait Pattern/deviations: Step-through pattern;Trunk flexed;Shuffle     General Gait Details: took few steps forward to sink and back to bed x1; on second attempt pt became weak and jerky, assisted to sitting, presyncopal but brought back with tapping and calling.  Pt tearful.  RN updated  Stairs            Wheelchair Mobility    Modified Rankin (Stroke Patients Only)       Balance Overall balance assessment: Needs assistance Sitting-balance support: Feet supported;No upper extremity supported Sitting balance-Leahy Scale: Fair     Standing balance support: Bilateral upper extremity supported;During functional activity Standing balance-Leahy Scale: Poor                               Pertinent Vitals/Pain Pain Assessment: No/denies pain    Home Living Family/patient expects to be discharged to:: Unsure (lives alone, poor conditions, has used  all SNF benefits...) Living Arrangements: Alone               Additional Comments: Pt takes sponge bath at sink. W/c and scooter do not fit inside the house so pt usually uses cane. Pt reports he is too large for his scooter and no longer feels safe using it in the community      Prior Function Level of Independence: Independent with assistive device(s)         Comments: previous SNF in October, has used all benefits, has little to no assistance, needs postacute rehab to improve functional capacity     Hand Dominance   Dominant Hand: Right    Extremity/Trunk Assessment   Upper Extremity Assessment: Generalized weakness           Lower Extremity Assessment: Generalized weakness         Communication   Communication: No difficulties  Cognition Arousal/Alertness: Lethargic;Awake/alert (periods of 'blankness' and pt reports 'blackouts' of sec's) Behavior During Therapy: WFL for tasks assessed/performed;Flat affect (cooperative but variable presentation) Overall Cognitive Status: Impaired/Different from baseline Area of Impairment: Attention;Memory   Current Attention Level: Sustained;Selective (varies depending on arousal) Memory:  (blank spots in arousal and memory of event)         General Comments: Pt has expected lightheadedness upon move to sitting/standing, but BP does not explicitly suggest orthostasis (see nursing notes, drop 157-->145 SPB upon standing.  However, pt also has episodes of blankness, where he does not respond to voice or touch, lasting brief seconds, then improving but leaves pt emotional and frightened.  RN updated, see also impression statemetn    General Comments General comments (skin integrity, edema, etc.): multiple dressing noted at right heel and sacrum, pt on bari air bed (educated as to why)    Exercises        Assessment/Plan    PT Assessment Patient needs continued PT services  PT Diagnosis Difficulty walking   PT Problem List Decreased activity tolerance;Decreased mobility;Decreased cognition;Decreased knowledge of use of DME;Cardiopulmonary status limiting activity;Obesity;Decreased skin integrity  PT Treatment Interventions Patient/family education;Therapeutic exercise;Therapeutic  activities;Functional mobility training;Stair training;Gait training;DME instruction   PT Goals (Current goals can be found in the Care Plan section) Acute Rehab PT Goals Patient Stated Goal: "I've got no choice but to go home..." PT Goal Formulation: With patient Time For Goal Achievement: 09/12/14 Potential to Achieve Goals: Fair    Frequency Min 3X/week   Barriers to discharge Inaccessible home environment;Decreased caregiver support lives alone, equipment doesn't fit in home, no help for day to day    Co-evaluation               End of Session Equipment Utilized During Treatment: Gait belt Activity Tolerance: Patient limited by fatigue;Patient limited by lethargy Patient left: in bed;with call bell/phone within reach;with bed alarm set Nurse Communication: Mobility status         Time: [id number] PT Time Calculation (min) (ACUTE ONLY): 50 min   Charges:   PT Evaluation $Initial PT Evaluation Tier I: 1 Procedure PT Treatments $Gait Training: 8-22 mins $Therapeutic Activity: 23-37 mins   PT G Codes:          Herbie Drape 08/29/2014, 12:41 PM

## 2014-08-29 NOTE — Progress Notes (Signed)
PROGRESS NOTE  [name] [id number] DOB: 08/15/1946 DOA: 08/27/2014 PCP: Irven Shelling, MD  HPI: 68 y.o. male with PMH significant for frequent syncope events, UTI, chronic indwelling foley catheter, Diabetes, HTN, who presents with AMS.   Subjective / 24 H Interval events Much better this morning, mental status has returned to normal. Patient has little to no recollection over the last 24 hours  Assessment/Plan: Active Problems:   UTI (lower urinary tract infection)   CAD- known RCA disease- medical Rx   Obstipation   Chronic indwelling Foley catheter   Chronic diastolic CHF (congestive heart failure)   Altered mental status   Encephalopathy acute   Confusion   Acute encephalopathy - most likely related to his urinary tract infection. He has a history of recurrent urinary tract infections, her previous microbiology without any highly drug-resistant bacteria.  - cultures unfortunately returned negative, switch to Bactrim based on prior culture data  UTI - Bactrim  Chronic diastolic heart failure - he appears euvolemic, continue home medications  Chest pain on admission - resolved, cardiac enzymes negative.  Insulin-dependent diabetes - with complications, patient has peripheral neuropathy. Most recent hemoglobin A1c was 8.9 on 06/25/2014 showing poor control. Continue 70/30 insulin regimen.   Lower extremity wounds - patient has multiple wounds the left pretibial and right pretibial area, he has no sensation there and doesn't have a good insight into how he gets these. He has 2 superficial ulcerations above the right Achilles tendon on the right. Appreciate wound care consultation. - continue Bactrim  Sacral pressure superficial ulcer on right buttock - no evidence of infection, skin breakdown, this was present on admission  - Patient highly deconditioned, PT evaluation recommending CIR  Constipation - patient was able to have a bowel movement   Diet: Diet  Carb Modified Fluids: none DVT Prophylaxis: Lovenox  Code Status: Full Code Family Communication: d/w patient  Disposition Plan: remain inpatient CIR vs home with HHPT  Consultants:  None   Procedures:  None    Antibiotics Ceftriaxone 12/18>>   Studies  Mr Brain Wo Contrast  08/28/2014   CLINICAL DATA:  68 year old male with new altered mental status. Initial encounter.  EXAM: MRI HEAD WITHOUT CONTRAST  TECHNIQUE: Multiplanar, multiecho pulse sequences of the brain and surrounding structures were obtained without intravenous contrast.  COMPARISON:  Brain MRI 06/25/2014 and earlier.  FINDINGS: Major intracranial vascular flow voids are stable. Stable cerebral volume. No restricted diffusion to suggest acute infarction. No midline shift, mass effect, evidence of mass lesion, ventriculomegaly, extra-axial collection or acute intracranial hemorrhage. Cervicomedullary junction and pituitary are within normal limits. Negative visualized cervical spine. Pearline Cables and white matter signal is stable and within normal limits for a age.  Visible internal auditory structures appear normal. Mastoids are clear. Visualized orbit soft tissues are within normal limits. Trace paranasal sinus mucosal thickening. Visualized scalp soft tissues are within normal limits. Bone marrow signal within normal limits.  IMPRESSION: No acute intracranial abnormality. Stable and negative for age non contrast MRI appearance of the brain.   Electronically Signed   By: Lars Pinks M.D.   On: 08/28/2014 07:58   Objective  Filed Vitals:   08/28/14 2146 08/29/14 0551 08/29/14 1155 08/29/14 1200  BP: 127/69 144/69 158/79 140/85  Pulse: 71 65    Temp: 97.9 F (36.6 C) 97.8 F (36.6 C)    TempSrc: Oral Oral    Resp: 15 20    Height:      Weight:  SpO2: 100% 98%      Intake/Output Summary (Last 24 hours) at 08/29/14 1248 Last data filed at 08/29/14 1035  Gross per 24 hour  Intake   1660 ml  Output   2000 ml  Net    -340 ml   Filed Weights   08/27/14 1639  Weight: 158.305 kg (349 lb)   Exam:  General:  Pleasant obese Caucasian male in no apparent distress   Cardiovascular: Regular rate and rhythm, no murmurs rubs or gallops   Respiratory: Clear to auscultation bilaterally, no wheezing, moves air well   Abdomen: Obese, soft, mild tenderness to palpation bilateral lower quadrants   MSK: No peripheral edema. Chronic venous stasis changes bilateral lower extremities with multiple in various stages of healing ulcerations in the pretibial areas bilaterally, without any intensive local cellulitis, superficial pressure ulcer on the right buttock   Neuro: Nonfocal   Data Reviewed: Basic Metabolic Panel:  Recent Labs Lab 08/27/14 1548 08/29/14 0525  NA 135* 135*  K 4.2 4.2  CL 94* 99  CO2 27 25  GLUCOSE 209* 152*  BUN 11 10  CREATININE 0.64 0.66  CALCIUM 9.3 8.8   Liver Function Tests:  Recent Labs Lab 08/27/14 1548  AST 12  ALT 10  ALKPHOS 112  BILITOT 0.5  PROT 8.0  ALBUMIN 3.5    Recent Labs Lab 08/27/14 1548  LIPASE 18    Recent Labs Lab 08/27/14 1600  AMMONIA 15   CBC:  Recent Labs Lab 08/27/14 1548 08/29/14 0525  WBC 8.5 7.1  NEUTROABS 6.2  --   HGB 15.0 13.6  HCT 44.1 41.3  MCV 87.2 87.9  PLT 215 218   Cardiac Enzymes:  Recent Labs Lab 08/27/14 1845 08/28/14 0031 08/28/14 0840  TROPONINI <0.30 <0.30 <0.30   BNP (last 3 results)  Recent Labs  03/21/14 1551 04/01/14 1431 08/27/14 1548  PROBNP 2003.0* 658.6* 1121.0*   CBG:  Recent Labs Lab 08/28/14 2142 08/29/14 0220 08/29/14 0310 08/29/14 0757 08/29/14 1154  GLUCAP 192* 134* 142* 157* 139*    Recent Results (from the past 240 hour(s))  Blood culture (routine x 2)     Status: None (Preliminary result)   Collection Time: 08/27/14  3:54 PM  Result Value Ref Range Status   Specimen Description BLOOD ARM LEFT  Final   Special Requests BOTTLES DRAWN AEROBIC ONLY 10CC  Final    Culture  Setup Time   Final    08/27/2014 21:12 Performed at [facility]    Culture   Final           BLOOD CULTURE RECEIVED NO GROWTH TO DATE CULTURE WILL BE HELD FOR 5 DAYS BEFORE ISSUING A FINAL NEGATIVE REPORT Performed at [facility]    Report Status PENDING  Incomplete  Blood culture (routine x 2)     Status: None (Preliminary result)   Collection Time: 08/27/14  4:03 PM  Result Value Ref Range Status   Specimen Description BLOOD ARM RIGHT  Final   Special Requests BOTTLES DRAWN AEROBIC AND ANAEROBIC 10CC  Final   Culture  Setup Time   Final    08/27/2014 21:10 Performed at [facility]    Culture   Final           BLOOD CULTURE RECEIVED NO GROWTH TO DATE CULTURE WILL BE HELD FOR 5 DAYS BEFORE ISSUING A FINAL NEGATIVE REPORT Performed at [facility]    Report Status PENDING  Incomplete  Urine culture  Status: None   Collection Time: 08/27/14  4:35 PM  Result Value Ref Range Status   Specimen Description URINE, RANDOM  Final   Special Requests BAGGED URINE  Final   Culture  Setup Time   Final    08/27/2014 23:10 Performed at [facility]   Final    75,000 COLONIES/ML Performed at [facility]    Culture   Final    Multiple bacterial morphotypes present, none predominant. Suggest appropriate recollection if clinically indicated. Performed at [facility]    Report Status 08/28/2014 FINAL  Final  Gram stain     Status: None   Collection Time: 08/27/14  4:36 PM  Result Value Ref Range Status   Specimen Description URINE, RANDOM  Final   Special Requests BAGGED URINE  Final   Gram Stain   Final    CYTOSPIN PREP WBC PRESENT,BOTH PMN AND MONONUCLEAR GRAM POSITIVE COCCI IN PAIRS IN CLUSTERS GRAM NEGATIVE RODS    Report Status 08/27/2014 FINAL  Final  MRSA PCR Screening     Status: Abnormal   Collection Time: 08/27/14  9:09 PM  Result Value Ref Range Status   MRSA by PCR POSITIVE (A)  NEGATIVE Final    Comment:        The GeneXpert MRSA Assay (FDA approved for NASAL specimens only), is one component of a comprehensive MRSA colonization surveillance program. It is not intended to diagnose MRSA infection nor to guide or monitor treatment for MRSA infections. RESULT CALLED TO, READ BACK BY AND VERIFIED WITH: R.PEARCE ,RN AT 0102 BY L.PITT 08/28/14      Scheduled Meds: . aspirin EC  81 mg Oral q morning - 10a  . Chlorhexidine Gluconate Cloth  6 each Topical Q0600  . docusate sodium  100 mg Oral BID  . enoxaparin (LOVENOX) injection  80 mg Subcutaneous Q24H  . insulin aspart  0-9 Units Subcutaneous TID WC  . insulin aspart protamine- aspart  30 Units Subcutaneous BID WC  . metoprolol tartrate  25 mg Oral BID  . mupirocin ointment  1 application Nasal BID  . pantoprazole  40 mg Oral Daily  . polyethylene glycol  17 g Oral BID  . sodium chloride  3 mL Intravenous Q12H  . sodium phosphate  1 enema Rectal Once  . sulfamethoxazole-trimethoprim  1 tablet Oral Q12H   Continuous Infusions:   Time spent: 25 minutes  Marzetta Board, MD [facility] Pager [phone]. If 7 PM - 7 AM, please contact night-coverage at [email or web address], password [facility] 08/29/2014, 12:48 PM  LOS: 2 days

## 2014-08-30 ENCOUNTER — Encounter (HOSPITAL_COMMUNITY): Payer: Self-pay | Admitting: Cardiology

## 2014-08-30 DIAGNOSIS — R072 Precordial pain: Secondary | ICD-10-CM

## 2014-08-30 DIAGNOSIS — R079 Chest pain, unspecified: Secondary | ICD-10-CM

## 2014-08-30 DIAGNOSIS — I25119 Atherosclerotic heart disease of native coronary artery with unspecified angina pectoris: Secondary | ICD-10-CM

## 2014-08-30 LAB — GLUCOSE, CAPILLARY
GLUCOSE-CAPILLARY: 133 mg/dL — AB (ref 70–99)
Glucose-Capillary: 174 mg/dL — ABNORMAL HIGH (ref 70–99)
Glucose-Capillary: 184 mg/dL — ABNORMAL HIGH (ref 70–99)
Glucose-Capillary: 207 mg/dL — ABNORMAL HIGH (ref 70–99)
Glucose-Capillary: 243 mg/dL — ABNORMAL HIGH (ref 70–99)

## 2014-08-30 LAB — TROPONIN I
Troponin I: <0.3 ng/mL (ref <0.30)
Troponin I: <0.3 ng/mL (ref <0.30)
Troponin I: <0.3 ng/mL (ref <0.30)

## 2014-08-30 MED ORDER — ISOSORBIDE MONONITRATE ER 30 MG PO TB24
30.0000 mg | ORAL_TABLET | Freq: Every day | ORAL | Status: DC
  Administered 2014-08-30 – 2014-08-31 (×2): 30 mg via ORAL
  Filled 2014-08-30 (×3): qty 1

## 2014-08-30 MED ORDER — HYDROCERIN EX CREA
TOPICAL_CREAM | Freq: Two times a day (BID) | CUTANEOUS | Status: DC
  Administered 2014-08-30 – 2014-08-31 (×2): via TOPICAL
  Filled 2014-08-30 (×2): qty 113

## 2014-08-30 NOTE — Progress Notes (Signed)
Rehab Admissions Coordinator Note:  Patient was screened by Cleatrice Burke for appropriateness for an Inpatient Acute Rehab Consult per PT recommendation.   At this time, we are recommending we await rehab consult completion today.Cleatrice Burke 08/30/2014, 8:41 AM  I can be reached at [phone].

## 2014-08-30 NOTE — Consult Note (Signed)
Primary cardiologist: Dr Marlou Porch  HPI: 68 year old male for evaluation of chest pain. Catheterization in February 2013 showed a 30% circumflex, 50% diagonal and diffuse distal disease in RCA proximal to the PDA. There was note of left to right collaterals. Left ventricular end-diastolic pressure elevated at 26 mmHg. Nuclear study May 2015 showed an ejection fraction of 44%. There was inferior scar with mild peri-infarct ischemia. Study low risk. Echocardiogram October 2015 showed normal LV function. Mild aortic stenosis with mean gradient 16 mmHg. Mildly dilated ascending aorta, severe left atrial enlargement and mild mitral regurgitation. Patient admitted December 18 after being found confused and lethargic. He was found to have a urinary tract infection and has improved since hospitalization. Patient complained of chest pain last p.m. It was substernal without radiation. Not pleuritic, positional or related to food. Resolved spontaneously after approximately 15 minutes. Described as a pressure. No associated symptoms. Note he has had occasional chest pain at home and this does not appear to be unusual. His chest pain can last anywhere from 15 minutes to all morning. It is not exertional. Because of this chest pain cardiology was asked to evaluate.  Medications Prior to Admission  Medication Sig Dispense Refill  . aspirin EC 81 MG tablet Take 81 mg by mouth every morning.     . diphenhydrAMINE (SOMINEX) 25 MG tablet Take 25 mg by mouth at bedtime as needed for sleep.    . diphenoxylate-atropine (LOMOTIL) 2.5-0.025 MG per tablet Take 1 tablet by mouth 3 (three) times daily as needed. For diarrhea 90 tablet 0  . fexofenadine (ALLEGRA) 180 MG tablet Take 180 mg by mouth at bedtime as needed for allergies.     . furosemide (LASIX) 40 MG tablet Take 1 tablet (40 mg total) by mouth daily. 30 tablet 11  . HUMULIN 70/30 (70-30) 100 UNIT/ML injection Inject 40 Units into the skin 2 (two) times daily.  12  .  metoprolol tartrate (LOPRESSOR) 25 MG tablet Take 1 tablet (25 mg total) by mouth 2 (two) times daily. 60 tablet 11  . omeprazole (PRILOSEC) 20 MG capsule Take 20 mg by mouth daily.    Marland Kitchen oxybutynin (DITROPAN) 5 MG tablet Take 5 mg by mouth 3 (three) times daily as needed for bladder spasms.    . Sennosides 25 MG TABS Take 25 mg by mouth daily as needed (for constipation).    . insulin aspart protamine- aspart (NOVOLOG MIX 70/30) (70-30) 100 UNIT/ML injection Inject 0.2 mLs (20 Units total) into the skin 2 (two) times daily with a meal. (Patient taking differently: Inject 40 Units into the skin 2 (two) times daily with a meal. ) 10 mL 11  . Misc. Devices (HEART RATE MONITOR) MISC 1 Device by Does not apply route as directed. 1 each 0  . nitroGLYCERIN (NITROSTAT) 0.4 MG SL tablet Place 0.4 mg under the tongue every 5 (five) minutes as needed for chest pain.      Allergies  Allergen Reactions  . Ace Inhibitors Swelling  . Lipitor [Atorvastatin Calcium] Swelling  . Metformin And Related Swelling  . Cefadroxil Hives  . Cephalexin Hives  . Morphine And Related Other (See Comments)    Sweating, feels like is "in rocky boat."  . Robaxin [Methocarbamol] Other (See Comments)    Feels like he is shaky     Past Medical History  Diagnosis Date  . Obese   . Hypertension   . Diabetes mellitus   . Diabetic diarrhea   . Sleep apnea   .  Diabetic neuropathy, painful   . Chronic indwelling Foley catheter   . Bladder spasms   . Complication of anesthesia   . PONV (postoperative nausea and vomiting)   . Hyperlipidemia   . GERD (gastroesophageal reflux disease)   . Neurogenic bladder   . Stroke 2013    Past Surgical History  Procedure Laterality Date  . Right tkr    . Gastroplasty    . Cholecystectomy    . Total knee arthroplasty    . Left heart catheterization with coronary angiogram N/A 10/24/2011    Procedure: LEFT HEART CATHETERIZATION WITH CORONARY ANGIOGRAM;  Surgeon: Candee Furbish, MD;   Location: [facility] CATH LAB;  Service: Cardiovascular;  Laterality: N/A;  right radial artery approach    History   Social History  . Marital Status: Single    Spouse Name: N/A    Number of Children: N/A  . Years of Education: N/A   Occupational History  . Not on file.   Social History Main Topics  . Smoking status: Former Smoker -- 2.00 packs/day for 20 years    Quit date: 09/10/1976  . Smokeless tobacco: Never Used  . Alcohol Use: No     Comment: FORMER ALCOHOLIC WITH 23 YEAR SOBRIETY  . Drug Use: No  . Sexual Activity: No   Other Topics Concern  . Not on file   Social History Narrative    Family History  Problem Relation Age of Onset  . Hypertension Mother   . Diabetes type I Father   . Cancer Brother      Testicular Cancer  . Cancer Brother     Leukemia    ROS:  Patient describes productive cough but no fevers or chills, productive cough, hemoptysis, dysphasia, odynophagia, melena, hematochezia, hematuria, rash, seizure activity, orthopnea, PND, pedal edema, claudication. Remaining systems are negative.  Physical Exam:   Blood pressure 158/73, pulse 66, temperature 98 F (36.7 C), temperature source Oral, resp. rate 20, height 6' 1"  (1.854 m), weight 349 lb (158.305 kg), SpO2 98 %.  General:  Well developed/obese in NAD Skin warm/dry, excoriations noted on his back. Patient not depressed No peripheral clubbing Back-normal HEENT-normal/normal eyelids Neck supple/normal carotid upstroke bilaterally; no bruits; no JVD; no thyromegaly chest - CTA/ normal expansion CV - RRR/normal S1 and S2; no rubs or gallops;  PMI nondisplaced, 2/6 systolic murmur left sternal border. Abdomen -NT/ND, no HSM, no mass, + bowel sounds, no bruit, previous abdominal incision 2+ femoral pulses, no bruits Ext-no edema, chords; distal pulses diminished. Lower extremity ulcers noted. Neuro-grossly nonfocal  ECG sinus rhythm with first-degree AV block, cannot rule out prior septal  infarct, nonspecific ST changes.  Results for orders placed or performed during the hospital encounter of 08/27/14 (from the past 48 hour(s))  Glucose, capillary     Status: Abnormal   Collection Time: 08/28/14  6:07 PM  Result Value Ref Range   Glucose-Capillary 272 (H) 70 - 99 mg/dL  Glucose, capillary     Status: Abnormal   Collection Time: 08/28/14  9:42 PM  Result Value Ref Range   Glucose-Capillary 192 (H) 70 - 99 mg/dL   Comment 1 Notify RN   Glucose, capillary     Status: Abnormal   Collection Time: 08/29/14  2:20 AM  Result Value Ref Range   Glucose-Capillary 134 (H) 70 - 99 mg/dL  Glucose, capillary     Status: Abnormal   Collection Time: 08/29/14  3:10 AM  Result Value Ref Range   Glucose-Capillary 142 (H)  70 - 99 mg/dL   Comment 1 Notify RN    Comment 2 Documented in Chart   Basic metabolic panel     Status: Abnormal   Collection Time: 08/29/14  5:25 AM  Result Value Ref Range   Sodium 135 (L) 137 - 147 mEq/L   Potassium 4.2 3.7 - 5.3 mEq/L   Chloride 99 96 - 112 mEq/L   CO2 25 19 - 32 mEq/L   Glucose, Bld 152 (H) 70 - 99 mg/dL   BUN 10 6 - 23 mg/dL   Creatinine, Ser 0.66 0.50 - 1.35 mg/dL   Calcium 8.8 8.4 - 10.5 mg/dL   GFR calc non Af Amer >90 >90 mL/min   GFR calc Af Amer >90 >90 mL/min    Comment: (NOTE) The eGFR has been calculated using the CKD EPI equation. This calculation has not been validated in all clinical situations. eGFR's persistently <90 mL/min signify possible Chronic Kidney Disease.    Anion gap 11 5 - 15  CBC     Status: None   Collection Time: 08/29/14  5:25 AM  Result Value Ref Range   WBC 7.1 4.0 - 10.5 K/uL   RBC 4.70 4.22 - 5.81 MIL/uL   Hemoglobin 13.6 13.0 - 17.0 g/dL   HCT 41.3 39.0 - 52.0 %   MCV 87.9 78.0 - 100.0 fL   MCH 28.9 26.0 - 34.0 pg   MCHC 32.9 30.0 - 36.0 g/dL   RDW 13.5 11.5 - 15.5 %   Platelets 218 150 - 400 K/uL  Glucose, capillary     Status: Abnormal   Collection Time: 08/29/14  7:57 AM  Result Value Ref  Range   Glucose-Capillary 157 (H) 70 - 99 mg/dL  Glucose, capillary     Status: Abnormal   Collection Time: 08/29/14 11:54 AM  Result Value Ref Range   Glucose-Capillary 139 (H) 70 - 99 mg/dL  Glucose, capillary     Status: Abnormal   Collection Time: 08/29/14  4:27 PM  Result Value Ref Range   Glucose-Capillary 173 (H) 70 - 99 mg/dL  Glucose, capillary     Status: Abnormal   Collection Time: 08/29/14 10:20 PM  Result Value Ref Range   Glucose-Capillary 184 (H) 70 - 99 mg/dL  Glucose, capillary     Status: Abnormal   Collection Time: 08/30/14 12:42 AM  Result Value Ref Range   Glucose-Capillary 207 (H) 70 - 99 mg/dL  Troponin I (q 6hr x 3)     Status: None   Collection Time: 08/30/14  2:40 AM  Result Value Ref Range   Troponin I <0.30 <0.30 ng/mL    Comment:        Due to the release kinetics of cTnI, a negative result within the first hours of the onset of symptoms does not rule out myocardial infarction with certainty. If myocardial infarction is still suspected, repeat the test at appropriate intervals.   Troponin I (q 6hr x 3)     Status: None   Collection Time: 08/30/14  6:50 AM  Result Value Ref Range   Troponin I <0.30 <0.30 ng/mL    Comment:        Due to the release kinetics of cTnI, a negative result within the first hours of the onset of symptoms does not rule out myocardial infarction with certainty. If myocardial infarction is still suspected, repeat the test at appropriate intervals.   Glucose, capillary     Status: Abnormal   Collection Time: 08/30/14  8:09 AM  Result Value Ref Range   Glucose-Capillary 184 (H) 70 - 99 mg/dL  Glucose, capillary     Status: Abnormal   Collection Time: 08/30/14 11:42 AM  Result Value Ref Range   Glucose-Capillary 243 (H) 70 - 99 mg/dL    No results found.  Assessment/Plan 1 chest pain-patient apparently has occasional chest pain at home and this is not unusual for him. His electrocardiogram showed no acute ST  changes. Previous catheterization revealed diffuse distal RCA disease which would be difficult to approach percutaneously. His markers are negative. Recent nuclear study was low risk. I would favor medical therapy at this point. Continue aspirin. Continue beta blocker. Add imdur 30 mg daily. FU with Dr Marlou Porch following DC. 2 Coronary artery disease-continue aspirin. Patient apparently has an allergy to Lipitor. Would consider trial of different statin in the future but I will leave this to his primary cardiologist. 3 hypertension-blood pressure controlled. Continue present medications. 4 urinary tract infection-antibiotics per primary care.  5 chronic diastolic congestive heart failure-patient appears to be euvolemic on examination. Continue present dose of Lasix.  Kirk Ruths MD 08/30/2014, 12:42 PM

## 2014-08-30 NOTE — Progress Notes (Signed)
Physical Therapy Treatment Patient Details Name: [name] MRN: [id number] DOB: 1945-12-10 Today's Date: 08/30/2014    History of Present Illness 68 y.o. male with PMH significant for frequent syncope events, UTI, chronic indwelling foley catheter, Diabetes, HTN, who presents with AMS.     PT Comments    Pt appearing to be more alert and demonstrated improved mobility today compared to yesterday's session. Pt able to ambulate with regular RW in room because bariatric RW was broken. PT was able to fix bariatric RW at end of session so it should be good to use next session. Pt without loss of balance but did exhibit fatigue at the end of walking and stated his feet felt like "sponges". Pt had no complaints of dizziness upon standing or ambulating. Pt able to perform exercises with pt at end of session and stated that he tried to move his legs around in the bed frequently so they don't get stiff on him.    Follow Up Recommendations  CIR     Equipment Recommendations   (Bariatric RW)    Recommendations for Other Services Rehab consult     Precautions / Restrictions Precautions Precautions: Fall Precaution Comments: obesity, weakness and episodes of ??presyncope vs. other etiology... 2 person assist use bariatric equipment, sit up EOB as much as tolerates Restrictions Weight Bearing Restrictions: No Other Position/Activity Restrictions: on bari air bed    Mobility  Bed Mobility Overal bed mobility: Needs Assistance Bed Mobility: Supine to Sit;Sit to Supine     Supine to sit: Min assist;HOB elevated Sit to supine: Min assist;HOB elevated   General bed mobility comments: Pt adjusted HOB and bed height to his liking prior to initiating transfer to sitting EOB. Pt with min assist and cuing from PT for transfers into/out of bed. Pt stating "You won't be at home with me so I need to try myself."  Transfers Overall transfer level: Needs assistance Equipment used: Rolling walker  (2 wheeled) Transfers: Sit to/from Stand Sit to Stand: From elevated surface;Min assist         General transfer comment: Pt able to stand from elevated bed with min assist to boost to stand. Pt requiring RW once standing to maintain balance.   Ambulation/Gait Ambulation/Gait assistance: Min assist Ambulation Distance (Feet): 35 Feet Assistive device: Rolling walker (2 wheeled) Gait Pattern/deviations: Step-through pattern;Decreased stride length;Trunk flexed   Gait velocity interpretation: Below normal speed for age/gender General Gait Details: Pt able to walk to door and back x2. Pt without overt loss of balance but was complaining of his feet feeling like sponges during the second round. Pt became very fatigued at the end of the second round of ambulation. Pt without complaint of dizziness during ambulation.    Stairs            Wheelchair Mobility    Modified Rankin (Stroke Patients Only)       Balance Overall balance assessment: Needs assistance Sitting-balance support: Feet supported;No upper extremity supported Sitting balance-Leahy Scale: Fair Sitting balance - Comments: Pt able to sit without UE support on EOB.    Standing balance support: During functional activity;Bilateral upper extremity supported Standing balance-Leahy Scale: Poor Standing balance comment: Pt required RW to maintain standing balance.                     Cognition Arousal/Alertness: Awake/alert Behavior During Therapy: WFL for tasks assessed/performed Overall Cognitive Status: Within Functional Limits for tasks assessed  Exercises General Exercises - Lower Extremity Ankle Circles/Pumps: AROM;Both;10 reps;Seated Quad Sets: Strengthening;Both;10 reps;Supine Gluteal Sets: Strengthening;Both;10 reps;Supine Long Arc Quad: AROM;Both;10 reps;Seated Hip Flexion/Marching: AROM;Both;10 reps;Seated    General Comments        Pertinent Vitals/Pain Pain  Assessment: Faces Faces Pain Scale: Hurts little more Pain Location: From right hip down right leg Pain Intervention(s): Limited activity within patient's tolerance;Monitored during session;Repositioned    Home Living                      Prior Function             PT Goals (current goals can now be found in the care plan section) Acute Rehab PT Goals PT Goal Formulation: With patient Time For Goal Achievement: 09/12/14 Potential to Achieve Goals: Fair Progress towards PT goals: Progressing toward goals    Frequency  Min 3X/week    PT Plan Current plan remains appropriate    Co-evaluation             End of Session Equipment Utilized During Treatment: Gait belt Activity Tolerance: Patient tolerated treatment well;Patient limited by fatigue Patient left: in bed;with call bell/phone within reach     Time: 1448-1521 PT Time Calculation (min) (ACUTE ONLY): 33 min  Charges:  $Gait Training: 8-22 mins $Therapeutic Exercise: 8-22 mins                    G CodesJearld Shines SPT 08/30/2014, 4:49 PM  Jearld Shines, [facility]  Acute Rehabilitation [phone]

## 2014-08-30 NOTE — Consult Note (Addendum)
WOC wound consult note Reason for Consult: Consult requested for bilat legs.  Pt has multiple patchy areas of dry scabs; he states they are from abrasions when he has previously bumped into objects. Affected areas are bilat anterior calves. Drainage (amount, consistency, odor) No odor or drainage Periwound: Pink dry wound beds revealed where previous scabs have peeled. Dressing procedure/placement/frequency: Eucerin cream to promote moist healing.  May remain open to air. Please re-consult if further assistance is needed.  Thank-you,  Julien Girt MSN, [place], [place], [place], [place]

## 2014-08-30 NOTE — Progress Notes (Signed)
Pt.claimed chest pain is better.,but c/o of headache Tylenol 650 mg given.Will cont.to monitor pt.

## 2014-08-30 NOTE — Consult Note (Signed)
Physical Medicine and Rehabilitation Consult  Reason for Consult: Deconditioning Referring Physician: Dr. Cruzita Lederer.    HPI: [name] is a 68 y.o. male PMH significant for frequent syncope events, morbid obesity, UTI, chronic indwelling foley catheter, Diabetes with chronic BLE ulcers and sacral decub,  HTN, who presented to ED 08/27/14 with confusion and lethargy due to UTI. MRI brain negative and he was started on IV Vancomycin for treatment. EEG showed normal study. Mentation has improved but patient continue with weakness as well as difficulty with mobility. PT evaluation done yesterday and limited by presyncopal episode. Another PT session today was much better 35 feet ambulationHe developed chest pain this and cardiology consulted for input/work up. CIR recommended for follow up therapy as patient lives alone and has used up SNF benefits for the year.   Patient denies any pain complaints. He states he ambulated in the room today with PT and stated they did not have to hold onto him. Review of Systems  Eyes: Negative for blurred vision and double vision.  Respiratory: Negative for cough and shortness of breath.   Cardiovascular: Positive for chest pain.  Gastrointestinal: Positive for heartburn. Negative for constipation.  Musculoskeletal: Positive for falls. Negative for myalgias, back pain and joint pain.  Neurological: Positive for dizziness (when standing. ) and sensory change.      Past Medical History  Diagnosis Date  . Obese   . Hypertension   . Diabetes mellitus   . Diabetic diarrhea   . Sleep apnea   . Diabetic neuropathy, painful   . Chronic indwelling Foley catheter   . Bladder spasms   . Complication of anesthesia   . PONV (postoperative nausea and vomiting)   . Hyperlipidemia   . GERD (gastroesophageal reflux disease)   . Neurogenic bladder   . Stroke 2013    Past Surgical History  Procedure Laterality Date  . Right tkr    . Gastroplasty    .  Cholecystectomy    . Total knee arthroplasty    . Left heart catheterization with coronary angiogram N/A 10/24/2011    Procedure: LEFT HEART CATHETERIZATION WITH CORONARY ANGIOGRAM;  Surgeon: Candee Furbish, MD;  Location: [facility] CATH LAB;  Service: Cardiovascular;  Laterality: N/A;  right radial artery approach    Family History  Problem Relation Age of Onset  . Hypertension Mother   . Diabetes type I Father   . Cancer Brother      Testicular Cancer  . Cancer Brother     Leukemia    Social History:  Lives alone. Independent with walker PTA. reports that he quit smoking about 37 years ago. He has never used smokeless tobacco. He reports that he does not drink alcohol or use illicit drugs.    Allergies  Allergen Reactions  . Ace Inhibitors Swelling  . Lipitor [Atorvastatin Calcium] Swelling  . Metformin And Related Swelling  . Cefadroxil Hives  . Cephalexin Hives  . Morphine And Related Other (See Comments)    Sweating, feels like is "in rocky boat."  . Robaxin [Methocarbamol] Other (See Comments)    Feels like he is shaky    Medications Prior to Admission  Medication Sig Dispense Refill  . aspirin EC 81 MG tablet Take 81 mg by mouth every morning.     . diphenhydrAMINE (SOMINEX) 25 MG tablet Take 25 mg by mouth at bedtime as needed for sleep.    . diphenoxylate-atropine (LOMOTIL) 2.5-0.025 MG per tablet Take 1 tablet by mouth  3 (three) times daily as needed. For diarrhea 90 tablet 0  . fexofenadine (ALLEGRA) 180 MG tablet Take 180 mg by mouth at bedtime as needed for allergies.     . furosemide (LASIX) 40 MG tablet Take 1 tablet (40 mg total) by mouth daily. 30 tablet 11  . HUMULIN 70/30 (70-30) 100 UNIT/ML injection Inject 40 Units into the skin 2 (two) times daily.  12  . metoprolol tartrate (LOPRESSOR) 25 MG tablet Take 1 tablet (25 mg total) by mouth 2 (two) times daily. 60 tablet 11  . omeprazole (PRILOSEC) 20 MG capsule Take 20 mg by mouth daily.    Marland Kitchen oxybutynin (DITROPAN) 5  MG tablet Take 5 mg by mouth 3 (three) times daily as needed for bladder spasms.    . Sennosides 25 MG TABS Take 25 mg by mouth daily as needed (for constipation).    . insulin aspart protamine- aspart (NOVOLOG MIX 70/30) (70-30) 100 UNIT/ML injection Inject 0.2 mLs (20 Units total) into the skin 2 (two) times daily with a meal. (Patient taking differently: Inject 40 Units into the skin 2 (two) times daily with a meal. ) 10 mL 11  . Misc. Devices (HEART RATE MONITOR) MISC 1 Device by Does not apply route as directed. 1 each 0  . nitroGLYCERIN (NITROSTAT) 0.4 MG SL tablet Place 0.4 mg under the tongue every 5 (five) minutes as needed for chest pain.      Home: Home Living Family/patient expects to be discharged to:: Unsure (lives alone, poor conditions, has used all SNF benefits...) Living Arrangements: Alone Additional Comments: Pt takes sponge bath at sink. W/c and scooter do not fit inside the house so pt usually uses cane. Pt reports he is too large for his scooter and no longer feels safe using it in the community   Functional History: Prior Function Level of Independence: Independent with assistive device(s) Comments: previous SNF in October, has used all benefits, has little to no assistance, needs postacute rehab to improve functional capacity Functional Status:  Mobility: Bed Mobility Overal bed mobility: Needs Assistance Bed Mobility: Supine to Sit, Sit to Supine Supine to sit: Min assist, HOB elevated Sit to supine: Min assist, HOB elevated General bed mobility comments: uses rails and needs HOB up to move, able to get to/from EOB with little physical help but requires supervisoin or better (and hands on for safety first time) to accomplish safely Transfers Overall transfer level: Needs assistance Equipment used: Rolling walker (2 wheeled) Transfers: Sit to/from Stand Sit to Stand: +2 safety/equipment, From elevated surface General transfer comment: Stood x3 total, initially  with RN +2 help for liftoff and safety given unpredictable arousal... progresses to +1 assist and able to scoot to EOB and assist to transition to RW.  Stood for 30 sec max each trial progressing to wt shifting and stepping in place before attempting 2-3 steps fwd/backward (see gait) Ambulation/Gait Ambulation/Gait assistance: Min assist Ambulation Distance (Feet): 5 Feet Assistive device: Rolling walker (2 wheeled) Gait Pattern/deviations: Step-through pattern, Trunk flexed, Shuffle General Gait Details: took few steps forward to sink and back to bed x1; on second attempt pt became weak and jerky, assisted to sitting, presyncopal but brought back with tapping and calling.  Pt tearful.  RN updated    ADL:    Cognition: Cognition Overall Cognitive Status: Impaired/Different from baseline Orientation Level: Oriented X4 Cognition Arousal/Alertness: Lethargic, Awake/alert (periods of 'blankness' and pt reports 'blackouts' of sec's) Behavior During Therapy: WFL for tasks assessed/performed, Flat affect (cooperative  but variable presentation) Overall Cognitive Status: Impaired/Different from baseline Area of Impairment: Attention, Memory Current Attention Level: Sustained, Selective (varies depending on arousal) Memory:  (blank spots in arousal and memory of event) General Comments: Pt has expected lightheadedness upon move to sitting/standing, but BP does not explicitly suggest orthostasis (see nursing notes, drop 157-->145 SPB upon standing.  However, pt also has episodes of blankness, where he does not respond to voice or touch, lasting brief seconds, then improving but leaves pt emotional and frightened.  RN updated, see also impression statemetn  Blood pressure 158/73, pulse 66, temperature 98 F (36.7 C), temperature source Oral, resp. rate 20, height 6\' 1"  (1.854 m), weight 158.305 kg (349 lb), SpO2 98 %. Physical Exam  Nursing note and vitals reviewed. Constitutional: He is oriented to  person, place, and time. He appears well-developed and well-nourished.  Morbidly obese  HENT:  Head: Normocephalic and atraumatic.  Edetulous  Eyes: Conjunctivae are normal. Pupils are equal, round, and reactive to light.  Neck: Normal range of motion. Neck supple.  Cardiovascular: Normal rate and regular rhythm.   Respiratory: Effort normal and breath sounds normal. No respiratory distress. He has no wheezes.  GI: Soft. Bowel sounds are normal.  Musculoskeletal: He exhibits no edema.  Neurological: He is alert and oriented to person, place, and time.  Skin: Skin is warm and dry.  Multiple scabbed areas bilateral shins as well as stasis changes with evidence of healed skin tears. Multiple excoriated areas lower thighs.   Motor strength is 5/5 bilateral deltoids, biceps, triceps, grip 3 minus bilateral hip flexors 4 bilateral knee extensors 4 bilateral ankle dorsiflexor plantar flexor Sensation reduced below the knees bilaterally in lower extremities he also has reduction in a median nerve distribution light touch sensation in the right hand.  Results for orders placed or performed during the hospital encounter of 08/27/14 (from the past 24 hour(s))  Glucose, capillary     Status: Abnormal   Collection Time: 08/29/14 11:54 AM  Result Value Ref Range   Glucose-Capillary 139 (H) 70 - 99 mg/dL  Glucose, capillary     Status: Abnormal   Collection Time: 08/29/14  4:27 PM  Result Value Ref Range   Glucose-Capillary 173 (H) 70 - 99 mg/dL  Glucose, capillary     Status: Abnormal   Collection Time: 08/29/14 10:20 PM  Result Value Ref Range   Glucose-Capillary 184 (H) 70 - 99 mg/dL  Glucose, capillary     Status: Abnormal   Collection Time: 08/30/14 12:42 AM  Result Value Ref Range   Glucose-Capillary 207 (H) 70 - 99 mg/dL  Troponin I (q 6hr x 3)     Status: None   Collection Time: 08/30/14  2:40 AM  Result Value Ref Range   Troponin I <0.30 <0.30 ng/mL  Troponin I (q 6hr x 3)      Status: None   Collection Time: 08/30/14  6:50 AM  Result Value Ref Range   Troponin I <0.30 <0.30 ng/mL  Glucose, capillary     Status: Abnormal   Collection Time: 08/30/14  8:09 AM  Result Value Ref Range   Glucose-Capillary 184 (H) 70 - 99 mg/dL   No results found.  Assessment/Plan: Diagnosis: Deconditioning after urinary tract infection 1. Does the need for close, 24 hr/day medical supervision in concert with the patient's rehab needs make it unreasonable for this patient to be served in a less intensive setting? No 2. Co-Morbidities requiring supervision/potential complications: Morbid obesity, peripheral neuropathy 3. Due  to safety and medication administration, does the patient require 24 hr/day rehab nursing? N/A 4. Does the patient require coordinated care of a physician, rehab nurse, PT, OT to address physical and functional deficits in the context of the above medical diagnosis(es)? No Addressing deficits in the following areas: balance, endurance, locomotion, strength and transferring 5. Can the patient actively participate in an intensive therapy program of at least 3 hrs of therapy per day at least 5 days per week? Potentially 6. The potential for patient to make measurable gains while on inpatient rehab is fair 7. Anticipated functional outcomes upon discharge from inpatient rehab are n/a  with PT, n/a with OT, n/a with SLP. 8. Estimated rehab length of stay to reach the above functional goals is: NA 9. Does the patient have adequate social supports and living environment to accommodate these discharge functional goals? Potentially 10. Anticipated D/C setting: Home 11. Anticipated post D/C treatments: [place] therapy 12. Overall Rehab/Functional Prognosis: fair  RECOMMENDATIONS: This patient's condition is appropriate for continued rehabilitative care in the following setting: [facility] Therapy Patient has agreed to participate in recommended program. N/A Note that insurance prior  authorization may be required for reimbursement for recommended care.  Comment: Patient with chronic issues, severe peripheral neuropathy do not think he will progress beyond a supervision level. Do not think CIR would allow patient to return home independently.Would work on wheelchair level goals given his comorbidities    08/30/2014

## 2014-08-30 NOTE — Progress Notes (Signed)
Pt.c/o chest pain;V/S taken ,B/P=148/71;HR=65;R=26;02 SAT=100 on RA ;MD on call Alene Mires made aware ordered to do EKG stat & Troponin q 6 x 3.& to give NTG.SL.EKG done results relayed to MD on call [facility].Will continue to monitor pt.

## 2014-08-30 NOTE — Care Management Note (Unsigned)
    Page 1 of 1   08/30/2014     6:12:48 PM CARE MANAGEMENT NOTE 08/30/2014  Patient:  [name], [name]   Account Number:  [id number]  Date Initiated:  08/30/2014  Documentation initiated by:  Tomi Bamberger  Subjective/Objective Assessment:   dx encephalopathy  admit- lives alone.     Action/Plan:   pt eval- rec cir,  cir recs hhpt   Anticipated DC Date:  08/31/2014   Anticipated DC Plan:  [place]  CM consult      Choice offered to / List presented to:             Status of service:  In process, will continue to follow Medicare Important Message given?  YES (If response is "NO", the following Medicare IM given date fields will be blank) Date Medicare IM given:  08/30/2014 Medicare IM given by:  Tomi Bamberger Date Additional Medicare IM given:   Additional Medicare IM given by:    Discharge Disposition:    Per UR Regulation:  Reviewed for med. necessity/level of care/duration of stay  If discussed at [place] of Stay Meetings, dates discussed:    Comments:  08/30/14 [place], BSN [phone] per physical therap rec cir, and CIR recs hhpt, MD notified.

## 2014-08-30 NOTE — Progress Notes (Signed)
PROGRESS NOTE  [name] [id number] DOB: 1945/10/31 DOA: 08/27/2014 PCP: Irven Shelling, MD  HPI: 68 y.o. male with PMH significant for frequent syncope events, UTI, chronic indwelling foley catheter, Diabetes, HTN, who presents with AMS.   Subjective / 24 H Interval events - significant chest pain last night ~ 1 am, lasting for 15 minutes, like "someone sitting on his chest", irradiating into his left shoulder and arm - EKG without definitive ischemia - promptly relieved by s.l. nitro - no chest pain this morning  Assessment/Plan: Active Problems:   UTI (lower urinary tract infection)   CAD- known RCA disease- medical Rx   Obstipation   Chronic indwelling Foley catheter   Chronic diastolic CHF (congestive heart failure)   Altered mental status   Encephalopathy acute   Confusion   Chest pain on admission and recurrent 12/21 early am - with typical components for ischemic etiology based on quality and with prompt relief with nitroglycerin, no EKG changes and negative troponin - patient had a cath in 2013 which showed  "sigificant distal right coronary artery disease, diffuse, long segment up to 90% at the bifurcation of a small PDA branch. 50% stenosis of the proximal large first diagonal branch and elevated left ventricular end-diastolic pressure consistent with diastolic dysfunction. 26 mmHg. Given the diffuse, long nature of his distal right coronary region, percutaneous intervention to this region portends a high risk to benefit ratio. It was elected to proceed with medical therapy" - Stress test in May 2015 showed an EF 44%, with a question of inferior hypokinesis and perhaps mild inferior scar and mild peri-infarct ischemia  - I have asked cardiology to evaluate whether further testing is necessary at this juncture, appreciate input  Acute encephalopathy - most likely related to his urinary tract infection. He has a history of recurrent urinary tract infections,  her previous microbiology without any highly drug-resistant bacteria.  - cultures unfortunately returned negative, switch to Bactrim based on prior culture data  UTI - Bactrim  Chronic diastolic heart failure - he appears euvolemic, continue home medications  Insulin-dependent diabetes - with complications, patient has peripheral neuropathy. Most recent hemoglobin A1c was 8.9 on 06/25/2014 showing poor control. Continue 70/30 insulin regimen.   Lower extremity wounds - patient has multiple wounds the left pretibial and right pretibial area, he has no sensation there and doesn't have a good insight into how he gets these. He has 2 superficial ulcerations above the right Achilles tendon on the right. Appreciate wound care consultation. - continue Bactrim  Sacral pressure superficial ulcer on right buttock - no evidence of infection, skin breakdown, this was present on admission  - Patient highly deconditioned, PT evaluation recommending CIR  Constipation - patient was able to have a bowel movement   Diet: Diet Carb Modified Fluids: none DVT Prophylaxis: Lovenox  Code Status: Full Code Family Communication: d/w patient  Disposition Plan: remain inpatient CIR vs home with HHPT  Consultants:  Cardiology   Procedures:  None    Antibiotics Ceftriaxone 12/18>>12/20 Bactrim 12/21 >>   Studies  No results found. Objective  Filed Vitals:   08/29/14 1357 08/29/14 2142 08/30/14 0131 08/30/14 0543  BP: 140/75 134/85 148/71 158/73  Pulse: 65 67 65 66  Temp: 98.1 F (36.7 C) 98.3 F (36.8 C)  98 F (36.7 C)  TempSrc: Oral Oral  Oral  Resp: 20 18 26 20   Height:      Weight:      SpO2: 98% 98% 100% 98%  Intake/Output Summary (Last 24 hours) at 08/30/14 1137 Last data filed at 08/30/14 0544  Gross per 24 hour  Intake    222 ml  Output   2600 ml  Net  -2378 ml   Filed Weights   08/27/14 1639  Weight: 158.305 kg (349 lb)   Exam:  General:  Pleasant obese  Caucasian male in no apparent distress   Cardiovascular: Regular rate and rhythm, no murmurs rubs or gallops   Respiratory: Clear to auscultation bilaterally, no wheezing, moves air well   Abdomen: Obese, soft, mild tenderness to palpation bilateral lower quadrants   MSK: No peripheral edema. Chronic venous stasis changes bilateral lower extremities with multiple in various stages of healing ulcerations in the pretibial areas bilaterally, without any intensive local cellulitis  Neuro: Nonfocal   Data Reviewed: Basic Metabolic Panel:  Recent Labs Lab 08/27/14 1548 08/29/14 0525  NA 135* 135*  K 4.2 4.2  CL 94* 99  CO2 27 25  GLUCOSE 209* 152*  BUN 11 10  CREATININE 0.64 0.66  CALCIUM 9.3 8.8   Liver Function Tests:  Recent Labs Lab 08/27/14 1548  AST 12  ALT 10  ALKPHOS 112  BILITOT 0.5  PROT 8.0  ALBUMIN 3.5    Recent Labs Lab 08/27/14 1548  LIPASE 18    Recent Labs Lab 08/27/14 1600  AMMONIA 15   CBC:  Recent Labs Lab 08/27/14 1548 08/29/14 0525  WBC 8.5 7.1  NEUTROABS 6.2  --   HGB 15.0 13.6  HCT 44.1 41.3  MCV 87.2 87.9  PLT 215 218   Cardiac Enzymes:  Recent Labs Lab 08/27/14 1845 08/28/14 0031 08/28/14 0840 08/30/14 0240 08/30/14 0650  TROPONINI <0.30 <0.30 <0.30 <0.30 <0.30   BNP (last 3 results)  Recent Labs  03/21/14 1551 04/01/14 1431 08/27/14 1548  PROBNP 2003.0* 658.6* 1121.0*   CBG:  Recent Labs Lab 08/29/14 1154 08/29/14 1627 08/29/14 2220 08/30/14 0042 08/30/14 0809  GLUCAP 139* 173* 184* 207* 184*    Recent Results (from the past 240 hour(s))  Blood culture (routine x 2)     Status: None (Preliminary result)   Collection Time: 08/27/14  3:54 PM  Result Value Ref Range Status   Specimen Description BLOOD ARM LEFT  Final   Special Requests BOTTLES DRAWN AEROBIC ONLY 10CC  Final   Culture  Setup Time   Final    08/27/2014 21:12 Performed at [facility]    Culture   Final           BLOOD  CULTURE RECEIVED NO GROWTH TO DATE CULTURE WILL BE HELD FOR 5 DAYS BEFORE ISSUING A FINAL NEGATIVE REPORT Performed at [facility]    Report Status PENDING  Incomplete  Blood culture (routine x 2)     Status: None (Preliminary result)   Collection Time: 08/27/14  4:03 PM  Result Value Ref Range Status   Specimen Description BLOOD ARM RIGHT  Final   Special Requests BOTTLES DRAWN AEROBIC AND ANAEROBIC 10CC  Final   Culture  Setup Time   Final    08/27/2014 21:10 Performed at [facility]    Culture   Final           BLOOD CULTURE RECEIVED NO GROWTH TO DATE CULTURE WILL BE HELD FOR 5 DAYS BEFORE ISSUING A FINAL NEGATIVE REPORT Performed at [facility]    Report Status PENDING  Incomplete  Urine culture     Status: None   Collection Time:  08/27/14  4:35 PM  Result Value Ref Range Status   Specimen Description URINE, RANDOM  Final   Special Requests BAGGED URINE  Final   Culture  Setup Time   Final    08/27/2014 23:10 Performed at [place]   Final    75,000 COLONIES/ML Performed at [facility]    Culture   Final    Multiple bacterial morphotypes present, none predominant. Suggest appropriate recollection if clinically indicated. Performed at [facility]    Report Status 08/28/2014 FINAL  Final  Gram stain     Status: None   Collection Time: 08/27/14  4:36 PM  Result Value Ref Range Status   Specimen Description URINE, RANDOM  Final   Special Requests BAGGED URINE  Final   Gram Stain   Final    CYTOSPIN PREP WBC PRESENT,BOTH PMN AND MONONUCLEAR GRAM POSITIVE COCCI IN PAIRS IN CLUSTERS GRAM NEGATIVE RODS    Report Status 08/27/2014 FINAL  Final  MRSA PCR Screening     Status: Abnormal   Collection Time: 08/27/14  9:09 PM  Result Value Ref Range Status   MRSA by PCR POSITIVE (A) NEGATIVE Final    Comment:        The GeneXpert MRSA Assay (FDA approved for NASAL specimens only), is one component of  a comprehensive MRSA colonization surveillance program. It is not intended to diagnose MRSA infection nor to guide or monitor treatment for MRSA infections. RESULT CALLED TO, READ BACK BY AND VERIFIED WITH: R.PEARCE ,RN AT 0102 BY L.PITT 08/28/14      Scheduled Meds: . aspirin EC  81 mg Oral q morning - 10a  . Chlorhexidine Gluconate Cloth  6 each Topical Q0600  . docusate sodium  100 mg Oral BID  . enoxaparin (LOVENOX) injection  80 mg Subcutaneous Q24H  . insulin aspart  0-9 Units Subcutaneous TID WC  . insulin aspart protamine- aspart  30 Units Subcutaneous BID WC  . metoprolol tartrate  25 mg Oral BID  . mupirocin ointment  1 application Nasal BID  . pantoprazole  40 mg Oral Daily  . polyethylene glycol  17 g Oral BID  . sodium chloride  3 mL Intravenous Q12H  . sodium phosphate  1 enema Rectal Once  . sulfamethoxazole-trimethoprim  1 tablet Oral Q12H   Continuous Infusions:   Time spent: 35 minutes  Marzetta Board, MD [facility] Pager [phone]. If 7 PM - 7 AM, please contact night-coverage at [email or web address], password [facility] 08/30/2014, 11:37 AM  LOS: 3 days

## 2014-08-30 NOTE — Progress Notes (Signed)
Nutrition Brief Note  Patient identified on the Malnutrition Screening Tool (MST) Report. Patient with minimal weight loss (6% over the past year), now weight has been stable to the past 5 weeks.  Wt Readings from Last 15 Encounters:  08/27/14 349 lb (158.305 kg)  07/20/14 349 lb (158.305 kg)  07/18/14 355 lb (161.027 kg)  07/01/14 355 lb (161.027 kg)  06/29/14 351 lb 12.8 oz (159.575 kg)  04/03/14 352 lb 1.2 oz (159.7 kg)  03/23/14 359 lb 5.6 oz (163 kg)  01/20/14 369 lb (167.377 kg)  01/19/14 369 lb (167.377 kg)  12/25/13 371 lb 12.8 oz (168.647 kg)  10/24/12 378 lb 1.6 oz (171.505 kg)  03/30/12 372 lb 5.7 oz (168.9 kg)  03/11/12 378 lb 5 oz (171.6 kg)  11/17/11 392 lb 6.7 oz (178 kg)  11/12/11 384 lb (174.181 kg)    Body mass index is 46.05 kg/(m^2). Patient meets criteria for class 3, extreme/morbid obesity based on current BMI.   Current diet order is CHO modified, patient is consuming approximately 100% of meals at this time. Labs and medications reviewed.   WOC RN consulted for bilateral leg wounds, scabs. Intake is adequate to promote healing.  No nutrition interventions warranted at this time. If nutrition issues arise, please consult RD.   Molli Barrows, RD, LDN, [place] Pager [phone] After Hours Pager [phone]

## 2014-08-31 DIAGNOSIS — I639 Cerebral infarction, unspecified: Secondary | ICD-10-CM | POA: Diagnosis not present

## 2014-08-31 DIAGNOSIS — Z8673 Personal history of transient ischemic attack (TIA), and cerebral infarction without residual deficits: Secondary | ICD-10-CM | POA: Diagnosis not present

## 2014-08-31 DIAGNOSIS — E669 Obesity, unspecified: Secondary | ICD-10-CM | POA: Diagnosis not present

## 2014-08-31 DIAGNOSIS — R0602 Shortness of breath: Secondary | ICD-10-CM | POA: Diagnosis not present

## 2014-08-31 DIAGNOSIS — Y998 Other external cause status: Secondary | ICD-10-CM | POA: Diagnosis not present

## 2014-08-31 DIAGNOSIS — I5032 Chronic diastolic (congestive) heart failure: Secondary | ICD-10-CM | POA: Diagnosis not present

## 2014-08-31 DIAGNOSIS — E1140 Type 2 diabetes mellitus with diabetic neuropathy, unspecified: Secondary | ICD-10-CM | POA: Diagnosis not present

## 2014-08-31 DIAGNOSIS — L03116 Cellulitis of left lower limb: Secondary | ICD-10-CM | POA: Diagnosis not present

## 2014-08-31 DIAGNOSIS — Z87448 Personal history of other diseases of urinary system: Secondary | ICD-10-CM | POA: Diagnosis not present

## 2014-08-31 DIAGNOSIS — Y9389 Activity, other specified: Secondary | ICD-10-CM | POA: Diagnosis not present

## 2014-08-31 DIAGNOSIS — R5381 Other malaise: Secondary | ICD-10-CM | POA: Diagnosis not present

## 2014-08-31 DIAGNOSIS — I6789 Other cerebrovascular disease: Secondary | ICD-10-CM | POA: Diagnosis not present

## 2014-08-31 DIAGNOSIS — Z79899 Other long term (current) drug therapy: Secondary | ICD-10-CM | POA: Diagnosis not present

## 2014-08-31 DIAGNOSIS — Y9289 Other specified places as the place of occurrence of the external cause: Secondary | ICD-10-CM | POA: Diagnosis not present

## 2014-08-31 DIAGNOSIS — M6281 Muscle weakness (generalized): Secondary | ICD-10-CM | POA: Diagnosis not present

## 2014-08-31 DIAGNOSIS — W19XXXA Unspecified fall, initial encounter: Secondary | ICD-10-CM | POA: Diagnosis not present

## 2014-08-31 DIAGNOSIS — I25119 Atherosclerotic heart disease of native coronary artery with unspecified angina pectoris: Secondary | ICD-10-CM | POA: Diagnosis not present

## 2014-08-31 DIAGNOSIS — Z043 Encounter for examination and observation following other accident: Secondary | ICD-10-CM | POA: Diagnosis not present

## 2014-08-31 DIAGNOSIS — Z794 Long term (current) use of insulin: Secondary | ICD-10-CM | POA: Diagnosis not present

## 2014-08-31 DIAGNOSIS — K219 Gastro-esophageal reflux disease without esophagitis: Secondary | ICD-10-CM | POA: Diagnosis not present

## 2014-08-31 DIAGNOSIS — R4182 Altered mental status, unspecified: Secondary | ICD-10-CM | POA: Diagnosis not present

## 2014-08-31 DIAGNOSIS — R072 Precordial pain: Secondary | ICD-10-CM | POA: Diagnosis not present

## 2014-08-31 DIAGNOSIS — Z87891 Personal history of nicotine dependence: Secondary | ICD-10-CM | POA: Diagnosis not present

## 2014-08-31 DIAGNOSIS — R55 Syncope and collapse: Secondary | ICD-10-CM | POA: Diagnosis not present

## 2014-08-31 DIAGNOSIS — Z7982 Long term (current) use of aspirin: Secondary | ICD-10-CM | POA: Diagnosis not present

## 2014-08-31 DIAGNOSIS — N390 Urinary tract infection, site not specified: Secondary | ICD-10-CM | POA: Diagnosis not present

## 2014-08-31 DIAGNOSIS — Z9889 Other specified postprocedural states: Secondary | ICD-10-CM | POA: Diagnosis not present

## 2014-08-31 DIAGNOSIS — R0789 Other chest pain: Secondary | ICD-10-CM | POA: Diagnosis not present

## 2014-08-31 DIAGNOSIS — E118 Type 2 diabetes mellitus with unspecified complications: Secondary | ICD-10-CM | POA: Diagnosis not present

## 2014-08-31 DIAGNOSIS — Z8669 Personal history of other diseases of the nervous system and sense organs: Secondary | ICD-10-CM | POA: Diagnosis not present

## 2014-08-31 DIAGNOSIS — I2510 Atherosclerotic heart disease of native coronary artery without angina pectoris: Secondary | ICD-10-CM | POA: Diagnosis not present

## 2014-08-31 DIAGNOSIS — N319 Neuromuscular dysfunction of bladder, unspecified: Secondary | ICD-10-CM | POA: Diagnosis not present

## 2014-08-31 DIAGNOSIS — I10 Essential (primary) hypertension: Secondary | ICD-10-CM | POA: Diagnosis not present

## 2014-08-31 DIAGNOSIS — R400 Somnolence: Secondary | ICD-10-CM | POA: Diagnosis not present

## 2014-08-31 DIAGNOSIS — R42 Dizziness and giddiness: Secondary | ICD-10-CM | POA: Diagnosis not present

## 2014-08-31 DIAGNOSIS — R0600 Dyspnea, unspecified: Secondary | ICD-10-CM | POA: Diagnosis not present

## 2014-08-31 DIAGNOSIS — K5900 Constipation, unspecified: Secondary | ICD-10-CM | POA: Diagnosis not present

## 2014-08-31 DIAGNOSIS — F99 Mental disorder, not otherwise specified: Secondary | ICD-10-CM | POA: Diagnosis not present

## 2014-08-31 DIAGNOSIS — R079 Chest pain, unspecified: Secondary | ICD-10-CM | POA: Diagnosis not present

## 2014-08-31 LAB — COMPREHENSIVE METABOLIC PANEL
ALBUMIN: 2.9 g/dL — AB (ref 3.5–5.2)
ALT: 10 U/L (ref 0–53)
ANION GAP: 5 (ref 5–15)
AST: 12 U/L (ref 0–37)
Alkaline Phosphatase: 71 U/L (ref 39–117)
BILIRUBIN TOTAL: 0.3 mg/dL (ref 0.3–1.2)
BUN: 8 mg/dL (ref 6–23)
CALCIUM: 8.5 mg/dL (ref 8.4–10.5)
CHLORIDE: 100 meq/L (ref 96–112)
CO2: 30 mmol/L (ref 19–32)
CREATININE: 0.97 mg/dL (ref 0.50–1.35)
GFR calc Af Amer: >90 mL/min (ref >90)
GFR calc non Af Amer: 84 mL/min — ABNORMAL LOW (ref >90)
Glucose, Bld: 223 mg/dL — ABNORMAL HIGH (ref 70–99)
Potassium: 4.2 mmol/L (ref 3.5–5.1)
Sodium: 135 mmol/L (ref 135–145)
Total Protein: 5.7 g/dL — ABNORMAL LOW (ref 6.0–8.3)

## 2014-08-31 LAB — GLUCOSE, CAPILLARY
Glucose-Capillary: 192 mg/dL — ABNORMAL HIGH (ref 70–99)
Glucose-Capillary: 86 mg/dL (ref 70–99)
Glucose-Capillary: 105 mg/dL — ABNORMAL HIGH (ref 70–99)

## 2014-08-31 LAB — CBC
HEMATOCRIT: 38 % — AB (ref 39.0–52.0)
Hemoglobin: 12.5 g/dL — ABNORMAL LOW (ref 13.0–17.0)
MCH: 28.8 pg (ref 26.0–34.0)
MCHC: 32.9 g/dL (ref 30.0–36.0)
MCV: 87.6 fL (ref 78.0–100.0)
PLATELETS: 188 10*3/uL (ref 150–400)
RBC: 4.34 MIL/uL (ref 4.22–5.81)
RDW: 13.4 % (ref 11.5–15.5)
WBC: 6.3 10*3/uL (ref 4.0–10.5)

## 2014-08-31 MED ORDER — HYDROCODONE-ACETAMINOPHEN 5-325 MG PO TABS: 1.0000 | ORAL_TABLET | ORAL | Status: DC | PRN

## 2014-08-31 MED ORDER — ISOSORBIDE MONONITRATE ER 30 MG PO TB24: 30.0000 mg | ORAL_TABLET | Freq: Every day | ORAL | Status: DC

## 2014-08-31 MED ORDER — SULFAMETHOXAZOLE-TRIMETHOPRIM 800-160 MG PO TABS: 1.0000 | ORAL_TABLET | Freq: Two times a day (BID) | ORAL | Status: DC

## 2014-08-31 NOTE — Progress Notes (Signed)
Rehab admissions - Please see rehab consult done by Dr. Letta Pate recommending [facility] therapies.  I will sign off for inpatient rehab.  Call me for questions.  [phone]

## 2014-08-31 NOTE — Progress Notes (Signed)
Inpatient Diabetes Program Recommendations  AACE/ADA: New Consensus Statement on Inpatient Glycemic Control (2013)  Target Ranges:  Prepandial:   less than 140 mg/dL      Peak postprandial:   less than 180 mg/dL (1-2 hours)      Critically ill patients:  140 - 180 mg/dL     Results for Brian, Wood (MRN [id number]) as of 08/31/2014 10:18  Ref. Range 08/30/2014 08:09 08/30/2014 11:42 08/30/2014 16:44 08/30/2014 21:25  Glucose-Capillary Latest Range: 70-99 mg/dL 184 (H) 243 (H) 174 (H) 133 (H)    Results for Brian, Wood (MRN [id number]) as of 08/31/2014 10:18  Ref. Range 08/31/2014 08:04  Glucose-Capillary Latest Range: 70-99 mg/dL 192 (H)     Admitted with AMS/ UTI.  History of DM, HTN, CVA.   Home DM Meds: 70/30 insulin- 40 units bidwc   Current Insulin Orders: 70/30 insulin- 30 units bidwc      Novolog Sensitive SSI    MD- Please consider increasing Novolog SSI to Moderate scale     Will follow Wyn Quaker RN, MSN, CDE Diabetes Coordinator Inpatient Diabetes Program Team Pager: [phone] (8a-10p)

## 2014-08-31 NOTE — Progress Notes (Signed)
Patient ID: [name], male   DOB: 10/19/1945, 68 y.o.   MRN: [id number]  Please see cardiology consult note by Dr. Stanford Breed yesterday. The plan is included in his note. Cardiology will sign off.  Daryel November, MD

## 2014-08-31 NOTE — Plan of Care (Signed)
Problem: Phase I Progression Outcomes Goal: Voiding-avoid urinary catheter unless indicated Outcome: Not Met (add Reason) Chronic catheter use  Problem: Phase III Progression Outcomes Goal: Voiding independently Outcome: Not Met (add Reason) Chronic catheter use

## 2014-08-31 NOTE — Plan of Care (Signed)
Problem: Phase III Progression Outcomes Goal: Voiding independently Pt on chronic catheter use.

## 2014-08-31 NOTE — Clinical Social Work Placement (Signed)
Clinical Social Work Department CLINICAL SOCIAL WORK PLACEMENT NOTE 08/31/2014  Patient:  [name], [name]  Account Number:  [id number] Admit date:  08/27/2014  Clinical Social Worker:  Lovey Newcomer  Date/time:  08/31/2014 07:50 PM  Clinical Social Work is seeking post-discharge placement for this patient at the following level of care:   SKILLED NURSING   (*[name] will update this form in Epic as items are completed)   08/31/2014  Patient/family provided with [place] Department of Clinical Social Work's list of facilities offering this level of care within the geographic area requested by the patient (or if unable, by the patient's family).  08/31/2014  Patient/family informed of their freedom to choose among providers that offer the needed level of care, that participate in Medicare, Medicaid or managed care program needed by the patient, have an available bed and are willing to accept the patient.  08/31/2014  Patient/family informed of [facility]' ownership interest in [facility], as well as of the fact that they are under no obligation to receive care at this facility.  PASARR submitted to EDS on 08/31/2014 PASARR number received on 08/31/2014  FL2 transmitted to all facilities in geographic area requested by pt/family on  08/31/2014 FL2 transmitted to all facilities within larger geographic area on   Patient informed that his/her managed care company has contracts with or will negotiate with  certain facilities, including the following:     Patient/family informed of bed offers received:  08/31/2014 Patient chooses bed at [facility] [place] Physician recommends and patient chooses bed at    Patient to be transferred to [facility] on  08/31/2014 Patient to be transferred to facility by Ambulance Patient and family notified of transfer on 08/31/2014 Name of family member notified:  Patient states he will inform  his friend Gerald Stabs  The following physician request were entered in Epic:   Additional Comments:   Per MD patient ready for DC to [facility]. RN, patient, patient's family, and facility notified of DC. RN given number for report. DC packet on chart. [facility] transport requested for patient. [name] signing off.  Liz Beach MSW, [place], [place], JI:[id number]

## 2014-08-31 NOTE — Progress Notes (Signed)
Pt prepared for d/c to SNF. IV d/c'd. Skin intact except as most recently charted. Vitals are stable. Report called to receiving facility. Pt to be transported by ambulance service. 

## 2014-08-31 NOTE — Discharge Instructions (Signed)
Follow with Irven Shelling, MD in 5-7 days  Please get a complete blood count and chemistry panel checked by your Primary MD at your next visit, and again as instructed by your Primary MD. Please get your medications reviewed and adjusted by your Primary MD.  Please request your Primary MD to go over all Hospital Tests and Procedure/Radiological results at the follow up, please get all Hospital records sent to your Prim MD by signing hospital release before you go home.  If you had Pneumonia of Lung problems at the Hospital: Please get a 2 view Chest X ray done in 6-8 weeks after hospital discharge or sooner if instructed by your Primary MD.  If you have Congestive Heart Failure: Please call your Cardiologist or Primary MD anytime you have any of the following symptoms:  1) 3 pound weight gain in 24 hours or 5 pounds in 1 week  2) shortness of breath, with or without a dry hacking cough  3) swelling in the hands, feet or stomach  4) if you have to sleep on extra pillows at night in order to breathe  Follow cardiac low salt diet and 1.5 lit/day fluid restriction.  If you have diabetes Accuchecks 4 times/day, Once in AM empty stomach and then before each meal. Log in all results and show them to your primary doctor at your next visit. If any glucose reading is under 80 or above 300 call your primary MD immediately.  If you have Seizure/Convulsions/Epilepsy: Please do not drive, operate heavy machinery, participate in activities at heights or participate in high speed sports until you have seen by Primary MD or a Neurologist and advised to do so again.  If you had Gastrointestinal Bleeding: Please ask your Primary MD to check a complete blood count within one week of discharge or at your next visit. Your endoscopic/colonoscopic biopsies that are pending at the time of discharge, will also need to followed by your Primary MD.  Get Medicines reviewed and adjusted. Please take all your  medications with you for your next visit with your Primary MD  Please request your Primary MD to go over all hospital tests and procedure/radiological results at the follow up, please ask your Primary MD to get all Hospital records sent to his/her office.  If you experience worsening of your admission symptoms, develop shortness of breath, life threatening emergency, suicidal or homicidal thoughts you must seek medical attention immediately by calling 911 or calling your MD immediately  if symptoms less severe.  You must read complete instructions/literature along with all the possible adverse reactions/side effects for all the Medicines you take and that have been prescribed to you. Take any new Medicines after you have completely understood and accpet all the possible adverse reactions/side effects.   Do not drive or operate heavy machinery when taking Pain medications.   Do not take more than prescribed Pain, Sleep and Anxiety Medications  Special Instructions: If you have smoked or chewed Tobacco  in the last 2 yrs please stop smoking, stop any regular Alcohol  and or any Recreational drug use.  Wear Seat belts while driving.  Please note You were cared for by a hospitalist during your hospital stay. If you have any questions about your discharge medications or the care you received while you were in the hospital after you are discharged, you can call the unit and asked to speak with the hospitalist on call if the hospitalist that took care of you is not available. Once  you are discharged, your primary care physician will handle any further medical issues. Please note that NO REFILLS for any discharge medications will be authorized once you are discharged, as it is imperative that you return to your primary care physician (or establish a relationship with a primary care physician if you do not have one) for your aftercare needs so that they can reassess your need for medications and monitor your  lab values.  You can reach the hospitalist office at phone [phone] or fax [phone]   If you do not have a primary care physician, you can call [phone] for a physician referral.  Activity: As tolerated with Full fall precautions use walker/cane & assistance as needed  Diet: diabetic, heart healthy  Disposition Home

## 2014-08-31 NOTE — Discharge Summary (Addendum)
Physician Discharge Summary  [name] [id number] DOB: 01/23/1946 DOA: 08/27/2014  PCP: Irven Shelling, MD  Admit date: 08/27/2014 Discharge date: 08/31/2014  Time spent: 45 minutes  Recommendations for Outpatient Follow-up:  1. Follow up with [name] in 1-2 weeks 2. Follow up with Dr. Alinda Money as scheduled previously for Foley catheter change.  3. Continue Bactrim for presumed UTI and presumed LE cellulitis for 5 additional days  4. Follow up with Dr. Marlou Porch from cardiology in 1 month 5. Repeat renal function and CBC in a week   Discharge Diagnoses:  Active Problems:   UTI (lower urinary tract infection)   CAD- known RCA disease- medical Rx   Obstipation   Chronic indwelling Foley catheter   Chronic diastolic CHF (congestive heart failure)   Altered mental status   Encephalopathy acute   Confusion   Chest pain  Discharge Condition: stable  Diet recommendation: diabetic, heart healthy  Filed Weights   08/27/14 1639  Weight: 158.305 kg (349 lb)   History of present illness:  [name] is a 68 y.o. male with PMH significant for frequent syncope events, UTI, chronic indwelling foley catheter, Diabetes, HTN, who presents with AMS. Patient was talking with friends over the phone, patient only able to say a few words. Friend went to the house and found patient confuse, lethargic. Patient with poor living condition. Patient has become more alert in the ED. Evaluation: UA with large leukocytes, WBC 11-20. UDS negative. Chest X ray with mild CHF. Patient more alert but still slowly answering questions. He is complaining of chest pain. No BM last week. He relates lower quadrant abdominal pain.   Hospital Course:  Acute encephalopathy - most likely related to his urinary tract infection, also with a contributing mild cellulitis around several wounds in hie left lower extremity. This has improved with antibiotics and will be discharged on Bactrim which he will  need to continue for 5 additional days. He has a history of recurrent urinary tract infections, her previous microbiology without any highly drug-resistant bacteria.   Chest pain on admission and recurrent 12/21 early am - with typical components for ischemic etiology based on quality and with prompt relief with nitroglycerin, no EKG changes and negative troponin. Patient had a cath in 2013 which showed  "sigificant distal right coronary artery disease, diffuse, long segment up to 90% at the bifurcation of a small PDA branch. 50% stenosis of the proximal large first diagonal branch and elevated left ventricular end-diastolic pressure consistent with diastolic dysfunction. 26 mmHg. Given the diffuse, long nature of his distal right coronary region, percutaneous intervention to this region portends a high risk to benefit ratio. It was elected to proceed with medical therapy" - Stress test in May 2015 showed an EF 44%, with a question of inferior hypokinesis and perhaps mild inferior scar and mild peri-infarct ischemia  - cardiology evaluated patient while hospitalized and recommended continuing medical management and Imdur has been added to his medications regimen.  - recommend continued outpatient follow up with Cardiology  UTI - continue Bactrim, urine cultures unfortunately returned negative Cellulitis LLE - improving with Bactrim, recommend continued wound care Chronic diastolic heart failure - he appears euvolemic, continue home medications Insulin-dependent diabetes - with complications, patient has peripheral neuropathy. Most recent hemoglobin A1c was 8.9 on 06/25/2014 showing poor control. Continue 70/30 insulin regimen as below.  Lower extremity wounds - patient has multiple wounds the left pretibial and right pretibial area, he has no sensation there and  doesn't have a good insight into how he gets these. He has 2 superficial ulcerations above the right Achilles tendon on the right. - benefit  from ongoing physical therapy Sacral pressure superficial ulcer on right buttock - no evidence of infection, skin breakdown, this was present on admission  - close monitoring as an outpatient Constipation - patient was able to have a bowel movement, continue bowel regimen  Procedures:  None    Consultations:  Cardiology   Discharge Exam: Filed Vitals:   08/30/14 1439 08/30/14 2128 08/31/14 0700 08/31/14 0948  BP: 162/70 124/67 134/58 111/55  Pulse:  67 66 72  Temp: 98.1 F (36.7 C) 98 F (36.7 C) 98 F (36.7 C)   TempSrc: Oral Oral Oral   Resp: 20 18 18    Height:      Weight:      SpO2: 98% 99% 97%    General: NAD Cardiovascular: RRR Respiratory: CTA biL  Discharge Instructions     Medication List    STOP taking these medications        Heart Rate Monitor Misc     insulin aspart protamine- aspart (70-30) 100 UNIT/ML injection  Commonly known as:  NOVOLOG MIX 70/30      TAKE these medications        aspirin EC 81 MG tablet  Take 81 mg by mouth every morning.     diphenhydrAMINE 25 MG tablet  Commonly known as:  SOMINEX  Take 25 mg by mouth at bedtime as needed for sleep.     diphenoxylate-atropine 2.5-0.025 MG per tablet  Commonly known as:  LOMOTIL  Take 1 tablet by mouth 3 (three) times daily as needed. For diarrhea     fexofenadine 180 MG tablet  Commonly known as:  ALLEGRA  Take 180 mg by mouth at bedtime as needed for allergies.     furosemide 40 MG tablet  Commonly known as:  LASIX  Take 1 tablet (40 mg total) by mouth daily.     HUMULIN 70/30 (70-30) 100 UNIT/ML injection  Generic drug:  insulin NPH-regular Human  Inject 40 Units into the skin 2 (two) times daily.     HYDROcodone-acetaminophen 5-325 MG per tablet  Commonly known as:  NORCO/VICODIN  Take 1-2 tablets by mouth every 4 (four) hours as needed for moderate pain.     isosorbide mononitrate 30 MG 24 hr tablet  Commonly known as:  IMDUR  Take 1 tablet (30 mg total) by mouth  daily.     metoprolol tartrate 25 MG tablet  Commonly known as:  LOPRESSOR  Take 1 tablet (25 mg total) by mouth 2 (two) times daily.     nitroGLYCERIN 0.4 MG SL tablet  Commonly known as:  NITROSTAT  Place 0.4 mg under the tongue every 5 (five) minutes as needed for chest pain.     omeprazole 20 MG capsule  Commonly known as:  PRILOSEC  Take 20 mg by mouth daily.     oxybutynin 5 MG tablet  Commonly known as:  DITROPAN  Take 5 mg by mouth 3 (three) times daily as needed for bladder spasms.     Sennosides 25 MG Tabs  Take 25 mg by mouth daily as needed (for constipation).     sulfamethoxazole-trimethoprim 800-160 MG per tablet  Commonly known as:  BACTRIM DS,SEPTRA DS  Take 1 tablet by mouth every 12 (twelve) hours.       Follow-up Information    Follow up with Irven Shelling, MD. Schedule  an appointment as soon as possible for a visit in 2 weeks.   Specialty:  Internal Medicine   Contact information:   [address], [address] [place] [address] [phone]       Follow up with Dutch Gray, MD. Schedule an appointment as soon as possible for a visit in 2 weeks.   Specialty:  Urology   Contact information:   [place] [address] [phone]       Follow up with Candee Furbish, MD. Schedule an appointment as soon as possible for a visit in 1 month.   Specialty:  Cardiology   Contact information:   [id number] [place] [address] [phone]       The results of significant diagnostics from this hospitalization (including imaging, microbiology, ancillary and laboratory) are listed below for reference.    Significant Diagnostic Studies: Mr Herby Abraham Contrast  08/28/2014   CLINICAL DATA:  68 year old male with new altered mental status. Initial encounter.  EXAM: MRI HEAD WITHOUT CONTRAST  TECHNIQUE: Multiplanar, multiecho pulse sequences of the brain and surrounding structures were obtained without intravenous  contrast.  COMPARISON:  Brain MRI 06/25/2014 and earlier.  FINDINGS: Major intracranial vascular flow voids are stable. Stable cerebral volume. No restricted diffusion to suggest acute infarction. No midline shift, mass effect, evidence of mass lesion, ventriculomegaly, extra-axial collection or acute intracranial hemorrhage. Cervicomedullary junction and pituitary are within normal limits. Negative visualized cervical spine. Pearline Cables and white matter signal is stable and within normal limits for a age.  Visible internal auditory structures appear normal. Mastoids are clear. Visualized orbit soft tissues are within normal limits. Trace paranasal sinus mucosal thickening. Visualized scalp soft tissues are within normal limits. Bone marrow signal within normal limits.  IMPRESSION: No acute intracranial abnormality. Stable and negative for age non contrast MRI appearance of the brain.   Electronically Signed   By: Lars Pinks M.D.   On: 08/28/2014 07:58   Dg Chest Port 1 View  08/27/2014   CLINICAL DATA:  Nausea and vomiting  EXAM: PORTABLE CHEST - 1 VIEW  COMPARISON:  06/25/2014  FINDINGS: Cardiac shadow is mildly enlarged but stable. Mild vascular congestion is seen accentuated by the poor inspiratory effort. No bony abnormality is noted.  IMPRESSION: Mild CHF.   Electronically Signed   By: Inez Catalina M.D.   On: 08/27/2014 16:27   Dg Abd Portable 1v  08/27/2014   CLINICAL DATA:  No bowel movements for 5 days with nausea and vomiting  EXAM: PORTABLE ABDOMEN - 1 VIEW  COMPARISON:  None.  FINDINGS: Scattered large and small bowel gas is noted. Fecal material is noted within the colon predominately in the ascending colon. No impaction is seen. No free air is noted. No bony abnormality is seen.  IMPRESSION: Moderate stool burden.  No obstructive changes are seen.   Electronically Signed   By: Inez Catalina M.D.   On: 08/27/2014 16:28    Microbiology: Recent Results (from the past 240 hour(s))  Blood culture  (routine x 2)     Status: None (Preliminary result)   Collection Time: 08/27/14  3:54 PM  Result Value Ref Range Status   Specimen Description BLOOD ARM LEFT  Final   Special Requests BOTTLES DRAWN AEROBIC ONLY 10CC  Final   Culture  Setup Time   Final    08/27/2014 21:12 Performed at [facility]   Final  BLOOD CULTURE RECEIVED NO GROWTH TO DATE CULTURE WILL BE HELD FOR 5 DAYS BEFORE ISSUING A FINAL NEGATIVE REPORT Performed at [facility]    Report Status PENDING  Incomplete  Blood culture (routine x 2)     Status: None (Preliminary result)   Collection Time: 08/27/14  4:03 PM  Result Value Ref Range Status   Specimen Description BLOOD ARM RIGHT  Final   Special Requests BOTTLES DRAWN AEROBIC AND ANAEROBIC 10CC  Final   Culture  Setup Time   Final    08/27/2014 21:10 Performed at [facility]    Culture   Final           BLOOD CULTURE RECEIVED NO GROWTH TO DATE CULTURE WILL BE HELD FOR 5 DAYS BEFORE ISSUING A FINAL NEGATIVE REPORT Performed at [facility]    Report Status PENDING  Incomplete  Urine culture     Status: None   Collection Time: 08/27/14  4:35 PM  Result Value Ref Range Status   Specimen Description URINE, RANDOM  Final   Special Requests BAGGED URINE  Final   Culture  Setup Time   Final    08/27/2014 23:10 Performed at [place]   Final    75,000 COLONIES/ML Performed at [facility]    Culture   Final    Multiple bacterial morphotypes present, none predominant. Suggest appropriate recollection if clinically indicated. Performed at [facility]    Report Status 08/28/2014 FINAL  Final  Gram stain     Status: None   Collection Time: 08/27/14  4:36 PM  Result Value Ref Range Status   Specimen Description URINE, RANDOM  Final   Special Requests BAGGED URINE  Final   Gram Stain   Final    CYTOSPIN PREP WBC PRESENT,BOTH PMN AND MONONUCLEAR GRAM POSITIVE  COCCI IN PAIRS IN CLUSTERS GRAM NEGATIVE RODS    Report Status 08/27/2014 FINAL  Final  MRSA PCR Screening     Status: Abnormal   Collection Time: 08/27/14  9:09 PM  Result Value Ref Range Status   MRSA by PCR POSITIVE (A) NEGATIVE Final    Comment:        The GeneXpert MRSA Assay (FDA approved for NASAL specimens only), is one component of a comprehensive MRSA colonization surveillance program. It is not intended to diagnose MRSA infection nor to guide or monitor treatment for MRSA infections. RESULT CALLED TO, READ BACK BY AND VERIFIED WITH: R.PEARCE ,RN AT 0102 BY L.PITT 08/28/14      Labs: Basic Metabolic Panel:  Recent Labs Lab 08/27/14 1548 08/29/14 0525 08/31/14 0625  NA 135* 135* 135  K 4.2 4.2 4.2  CL 94* 99 100  CO2 27 25 30   GLUCOSE 209* 152* 223*  BUN 11 10 8   CREATININE 0.64 0.66 0.97  CALCIUM 9.3 8.8 8.5   Liver Function Tests:  Recent Labs Lab 08/27/14 1548 08/31/14 0625  AST 12 12  ALT 10 10  ALKPHOS 112 71  BILITOT 0.5 0.3  PROT 8.0 5.7*  ALBUMIN 3.5 2.9*    Recent Labs Lab 08/27/14 1548  LIPASE 18    Recent Labs Lab 08/27/14 1600  AMMONIA 15   CBC:  Recent Labs Lab 08/27/14 1548 08/29/14 0525 08/31/14 0625  WBC 8.5 7.1 6.3  NEUTROABS 6.2  --   --   HGB 15.0 13.6 12.5*  HCT 44.1 41.3 38.0*  MCV 87.2 87.9 87.6  PLT 215 218  188   Cardiac Enzymes:  Recent Labs Lab 08/28/14 0031 08/28/14 0840 08/30/14 0240 08/30/14 0650 08/30/14 1237  TROPONINI <0.30 <0.30 <0.30 <0.30 <0.30   BNP: BNP (last 3 results)  Recent Labs  03/21/14 1551 04/01/14 1431 08/27/14 1548  PROBNP 2003.0* 658.6* 1121.0*   CBG:  Recent Labs Lab 08/30/14 0809 08/30/14 1142 08/30/14 1644 08/30/14 2125 08/31/14 0804  GLUCAP 184* 243* 174* 133* 192*    Signed:  Marzetta Board  [facility] 08/31/2014, 11:39 AM

## 2014-08-31 NOTE — Progress Notes (Signed)
PT Cancellation Note  Patient Details Name: [name] MRN: [id number] DOB: [birth year]   Cancelled Treatment:    Reason Eval/Treat Not Completed: Patient declined, no reason specified. Pt states he needs the bed pan. Offered transfer to [facility] and pt declined, stating he refuses to use that specific BSC (unsure of reason why). Bed pan placed under pt and therapy offered after he was done however pt again declines, stating he is planning on leaving to go to rehab today. Will continue to follow.   Rolinda Roan 08/31/2014, 12:03 PM   Rolinda Roan, PT, DPT Acute Rehabilitation Services Pager: [phone]

## 2014-08-31 NOTE — Clinical Social Work Psychosocial (Signed)
Clinical Social Work Department BRIEF PSYCHOSOCIAL ASSESSMENT 08/31/2014  Patient:  [name], [name]     Account Number:  [id number]     Admit date:  08/27/2014  Clinical Social Worker:  Lovey Newcomer  Date/Time:  08/31/2014 10:15 AM  Referred by:  Physician  Date Referred:  08/31/2014 Referred for  SNF Placement   Other Referral:   NA   Interview type:  Patient Other interview type:   Patient alert and oriented at time of assessment.    PSYCHOSOCIAL DATA Living Status:  ALONE Admitted from facility:   Level of care:   Primary support name:  Gerald Stabs Primary support relationship to patient:  FRIEND Degree of support available:   Support is limited.    CURRENT CONCERNS Current Concerns  Post-Acute Placement   Other Concerns:   NA    SOCIAL WORK ASSESSMENT / PLAN [name] met with patient at bedside to complete assessment. CIR has evaluated patient and recommended HH therapies for patient. MD does not see this as an appropriate disposition for patient. [name] explained that [name] can assist patient with SNF placement if patient is agreeable. Patient states that he is agreeable to SNF placement but was told that all of his Medicare days had been used during last SNF stay. [name] has contacted last SNF patient was at and facility confirms that the patient does have 35 Medicare days left. Patient states that he is agreeable to DC to SNF today. [name] explained SNF search/placement process and answered patient's questions. Patient states that he prefers to DC to [facility]. Patient reports that he lives alone and has limited support. Per treatment team patient's home is in disarray and a potential hoarding situation.   Assessment/plan status:  Psychosocial Support/Ongoing Assessment of Needs Other assessment/ plan:   Complete Fl2, fax, PASRR   Information/referral to community resources:   [name] contact information and SNF list given.    PATIENT'S/FAMILY'S RESPONSE TO PLAN OF  CARE: Patient plans to DC to SNF today. [name] will assist.       Liz Beach MSW, [place], [place], [phone]

## 2014-09-01 ENCOUNTER — Non-Acute Institutional Stay (SKILLED_NURSING_FACILITY): Payer: Medicare Other | Admitting: Adult Health

## 2014-09-01 DIAGNOSIS — I5032 Chronic diastolic (congestive) heart failure: Secondary | ICD-10-CM | POA: Diagnosis not present

## 2014-09-01 DIAGNOSIS — I639 Cerebral infarction, unspecified: Secondary | ICD-10-CM | POA: Diagnosis not present

## 2014-09-01 DIAGNOSIS — I25119 Atherosclerotic heart disease of native coronary artery with unspecified angina pectoris: Secondary | ICD-10-CM

## 2014-09-01 DIAGNOSIS — E118 Type 2 diabetes mellitus with unspecified complications: Secondary | ICD-10-CM

## 2014-09-01 DIAGNOSIS — K219 Gastro-esophageal reflux disease without esophagitis: Secondary | ICD-10-CM

## 2014-09-01 DIAGNOSIS — Z978 Presence of other specified devices: Secondary | ICD-10-CM

## 2014-09-01 DIAGNOSIS — Z9889 Other specified postprocedural states: Secondary | ICD-10-CM | POA: Diagnosis not present

## 2014-09-01 DIAGNOSIS — N390 Urinary tract infection, site not specified: Secondary | ICD-10-CM

## 2014-09-01 DIAGNOSIS — Z960 Presence of urogenital implants: Secondary | ICD-10-CM

## 2014-09-02 LAB — CULTURE, BLOOD (ROUTINE X 2)
CULTURE: NO GROWTH
Culture: NO GROWTH

## 2014-09-06 ENCOUNTER — Encounter: Payer: Self-pay | Admitting: Adult Health

## 2014-09-06 ENCOUNTER — Other Ambulatory Visit: Payer: Self-pay | Admitting: Internal Medicine

## 2014-09-06 ENCOUNTER — Other Ambulatory Visit: Payer: Self-pay | Admitting: *Deleted

## 2014-09-06 ENCOUNTER — Other Ambulatory Visit: Payer: Self-pay

## 2014-09-06 DIAGNOSIS — K219 Gastro-esophageal reflux disease without esophagitis: Secondary | ICD-10-CM | POA: Insufficient documentation

## 2014-09-06 LAB — CBC WITH DIFFERENTIAL/PLATELET
Basophils Absolute: 0.2 10*3/uL — ABNORMAL HIGH (ref 0.0–0.1)
Basophils Relative: 2 % — ABNORMAL HIGH (ref 0–1)
Eosinophils Absolute: 0.4 10*3/uL (ref 0.0–0.7)
Eosinophils Relative: 5 % (ref 0–5)
HCT: 41 % (ref 39.0–52.0)
Hemoglobin: 14 g/dL (ref 13.0–17.0)
Lymphocytes Relative: 27 % (ref 12–46)
Lymphs Abs: 2 10*3/uL (ref 0.7–4.0)
MCH: 29.4 pg (ref 26.0–34.0)
MCHC: 34.1 g/dL (ref 30.0–36.0)
MCV: 86.1 fL (ref 78.0–100.0)
MPV: 9.7 fL (ref 9.4–12.4)
Monocytes Absolute: 0.7 10*3/uL (ref 0.1–1.0)
Monocytes Relative: 9 % (ref 3–12)
Neutro Abs: 4.3 10*3/uL (ref 1.7–7.7)
Neutrophils Relative %: 57 % (ref 43–77)
Platelets: 215 10*3/uL (ref 150–400)
RBC: 4.76 MIL/uL (ref 4.22–5.81)
RDW: 13.6 % (ref 11.5–15.5)
WBC: 7.5 10*3/uL (ref 4.0–10.5)

## 2014-09-06 LAB — BASIC METABOLIC PANEL
BUN: 15 mg/dL (ref 6–23)
CO2: 23 mEq/L (ref 19–32)
Calcium: 8.7 mg/dL (ref 8.4–10.5)
Chloride: 98 mEq/L (ref 96–112)
Creat: 0.78 mg/dL (ref 0.50–1.35)
Glucose, Bld: 196 mg/dL — ABNORMAL HIGH (ref 70–99)
Potassium: 4.4 mEq/L (ref 3.5–5.3)
Sodium: 134 mEq/L — ABNORMAL LOW (ref 135–145)

## 2014-09-06 MED ORDER — HYDROCODONE-ACETAMINOPHEN 5-325 MG PO TABS: 1.0000 | ORAL_TABLET | ORAL | Status: DC | PRN

## 2014-09-06 MED ORDER — DIPHENOXYLATE-ATROPINE 2.5-0.025 MG PO TABS: 1.0000 | ORAL_TABLET | Freq: Three times a day (TID) | ORAL | Status: DC | PRN

## 2014-09-06 NOTE — Telephone Encounter (Signed)
RX faxed to [facility] @ [phone], phone number [phone] 

## 2014-09-06 NOTE — Progress Notes (Signed)
Patient ID: [name], male   DOB: [birth year], 68 y.o.   MRN: [id number]  [name] living     Allergies  Allergen Reactions  . Ace Inhibitors Swelling  . Lipitor [Atorvastatin Calcium] Swelling  . Metformin And Related Swelling  . Cefadroxil Hives  . Cephalexin Hives  . Morphine And Related Other (See Comments)    Sweating, feels like is "in rocky boat."  . Robaxin [Methocarbamol] Other (See Comments)    Feels like he is shaky       Chief Complaint  Patient presents with  . Hospitalization Follow-up    HPI:  He has been hospitalized for lower extremity cellulitis and uti. He is to be on septra ds for another 5 days. He is here for short term therapy with his goal to return back home. He is not voicing any complaints at this time.    Past Medical History  Diagnosis Date  . Obese   . Hypertension   . Diabetes mellitus   . Diabetic diarrhea   . Sleep apnea   . Diabetic neuropathy, painful   . Chronic indwelling Foley catheter   . Bladder spasms   . Complication of anesthesia   . Hyperlipidemia   . GERD (gastroesophageal reflux disease)   . Neurogenic bladder   . Stroke 2013  . CAD (coronary artery disease)     Past Surgical History  Procedure Laterality Date  . Right tkr    . Gastroplasty    . Cholecystectomy    . Total knee arthroplasty    . Left heart catheterization with coronary angiogram N/A 10/24/2011    Procedure: LEFT HEART CATHETERIZATION WITH CORONARY ANGIOGRAM;  Surgeon: Brian Furbish, MD;  Location: [facility] CATH LAB;  Service: Cardiovascular;  Laterality: N/A;  right radial artery approach    VITAL SIGNS BP 119/66 mmHg  Pulse 68  Ht 6\' 1"  (1.854 m)  Wt 351 lb (159.213 kg)  BMI 46.32 kg/m2   Outpatient Encounter Prescriptions as of 09/01/2014  Medication Sig  . aspirin EC 81 MG tablet Take 81 mg by mouth every morning.   . diphenhydrAMINE (SOMINEX) 25 MG tablet Take 25 mg by mouth at bedtime as needed for sleep.  . diphenoxylate-atropine  (LOMOTIL) 2.5-0.025 MG per tablet Take 1 tablet by mouth 3 (three) times daily as needed. For diarrhea  . fexofenadine (ALLEGRA) 180 MG tablet Take 180 mg by mouth at bedtime as needed for allergies.   . furosemide (LASIX) 40 MG tablet Take 1 tablet (40 mg total) by mouth daily.  Marland Kitchen HUMULIN 70/30 (70-30) 100 UNIT/ML injection Inject 40 Units into the skin 2 (two) times daily.  Marland Kitchen HYDROcodone-acetaminophen (NORCO/VICODIN) 5-325 MG per tablet Take 1-2 tablets by mouth every 4 (four) hours as needed for moderate pain.  . isosorbide mononitrate (IMDUR) 30 MG 24 hr tablet Take 1 tablet (30 mg total) by mouth daily.  . metoprolol tartrate (LOPRESSOR) 25 MG tablet Take 1 tablet (25 mg total) by mouth 2 (two) times daily.  . nitroGLYCERIN (NITROSTAT) 0.4 MG SL tablet Place 0.4 mg under the tongue every 5 (five) minutes as needed for chest pain.  Marland Kitchen omeprazole (PRILOSEC) 20 MG capsule Take 20 mg by mouth daily.  Marland Kitchen oxybutynin (DITROPAN) 5 MG tablet Take 5 mg by mouth 3 (three) times daily as needed for bladder spasms.  . Sennosides 25 MG TABS Take 25 mg by mouth daily as needed (for constipation).  Marland Kitchen sulfamethoxazole-trimethoprim (BACTRIM DS,SEPTRA DS) 800-160 MG per tablet Take 1 tablet  by mouth every 12 (twelve) hours.     SIGNIFICANT DIAGNOSTIC EXAMS  08-27-14: kub Moderate stool burden.  No obstructive changes are seen  08-27-14: chest x-ray: Mild CHF.    LABS REVIEWED:   06-25-14: hgb a1c 8.9 08-27-14: wbc 8.5; hgb 15.0; hct 44.1 ;mcv 87.2; plt 215; glucose 209; bun 11; creat 0.64 ;k+4.2; na++135 liver normal albumin 3.5; ammonia 15; tsh 2.95; BNP 1121.0; urine culture: negative 08-31-14: wbc 6.3; hgb 12.5; hct 38.0; mcv 87.6; plt 188; glucose 223; bun 8; creat 0.77; k+4.2; na++135; liver normal albumin 2.9   ROS   Physical Exam  Constitutional: He is oriented to person, place, and time. No distress.  Morbid obese   Neck: Neck supple. No JVD present. No thyromegaly present.    Cardiovascular: Normal rate, regular rhythm and intact distal pulses.   Respiratory: Effort normal and breath sounds normal. No respiratory distress.  GI: Soft. Bowel sounds are normal. He exhibits no distension. There is no tenderness.  Musculoskeletal:  Is able to move all extremities; no edema   Neurological: He is alert and oriented to person, place, and time.  Skin: Skin is warm and dry. He is not diaphoretic.  Lower extremities discolored  Lower extremities with scabbed areas        ASSESSMENT/ PLAN:  1. Lower extremity cellulitis: will complete septra ds for another 5 days and will monitor   2. UTI: has foley; will follow up with urology. Has oxybutrin 5 mg three times daily as needed for bladder spasms. Will complete another 5 days of septra ds. His urine culture was negative for growth.   3. Diabetes: will continue humulin 70/30 40 units twice daily and will monitor   4. CAD: no further complaints of chest pain; will continue imdur 30 mg daily; will continue asa 81 mg daily; ntg prn  5. CVA: is neurologically stable; will continue asa 81 mg daily  6. Diastolic chf: is stable will continue lasix 40 mg daily will continue lopressor 25 mg twice daily  will weigh daily   7. Jerrye Bushy: will continue prilosec 20 mg daily   8. Allergic rhinitis: will continue allegra 180 mg daily   9. Constipation: will continue sennosides 25 mg daily as needed    Will check cbc and bmp in one week  Time spent with patient 50 minutes.    Ok Edwards NP [facility]  Contact [phone] Monday through Friday 8am- 5pm  After hours call [phone]

## 2014-09-07 ENCOUNTER — Non-Acute Institutional Stay (SKILLED_NURSING_FACILITY): Payer: Medicare Other | Admitting: Internal Medicine

## 2014-09-07 ENCOUNTER — Encounter: Payer: Self-pay | Admitting: Internal Medicine

## 2014-09-07 DIAGNOSIS — E118 Type 2 diabetes mellitus with unspecified complications: Secondary | ICD-10-CM | POA: Diagnosis not present

## 2014-09-07 DIAGNOSIS — Z960 Presence of urogenital implants: Secondary | ICD-10-CM

## 2014-09-07 DIAGNOSIS — Z9889 Other specified postprocedural states: Secondary | ICD-10-CM | POA: Diagnosis not present

## 2014-09-07 DIAGNOSIS — R5381 Other malaise: Secondary | ICD-10-CM

## 2014-09-07 DIAGNOSIS — Z978 Presence of other specified devices: Secondary | ICD-10-CM

## 2014-09-07 DIAGNOSIS — R42 Dizziness and giddiness: Secondary | ICD-10-CM | POA: Diagnosis not present

## 2014-09-07 DIAGNOSIS — N390 Urinary tract infection, site not specified: Secondary | ICD-10-CM | POA: Diagnosis not present

## 2014-09-07 DIAGNOSIS — I5032 Chronic diastolic (congestive) heart failure: Secondary | ICD-10-CM | POA: Diagnosis not present

## 2014-09-07 NOTE — Progress Notes (Signed)
Patient ID: [name], male   DOB: 09-26-1945, 68 y.o.   MRN: [id number]    HISTORY AND PHYSICAL  Location:  [facility] of Service: [id number] [phone])   Extended Emergency Contact Information Primary Emergency Contact: Whitting,[name] Address: [address]          [place],  [address] [name] Phone: [phone] Relation: Friend  Advanced Directive information  Full Code  Chief Complaint  Patient presents with  . New Admit To SNF  . Dizziness    HPI:  68 yo male seen today as a new admission. He was d/c'd from the hospital with a diagnosis of UTI with chronic indwelling foley cath. He completed Bactrim tx. He also had CP which has since resolved. Continues to have a superficial pressure ulcer at sacrum and buttocks which is being followed by wound care. Acute change in mental status resolved. He reports feeling lightheaded when he stands from a seated position intermittently x 1 yr but it has worsened in last few days. He has had to sit down while having PT on several occasions. Admits that he does not eat very well as the food is not appealing. Denies CP, SOB or change in bowel/bladder habits. Hx syncope, DM and HTN. No other concerns  Past Medical History  Diagnosis Date  . Obese   . Hypertension   . Diabetes mellitus   . Diabetic diarrhea   . Sleep apnea   . Diabetic neuropathy, painful   . Chronic indwelling Foley catheter   . Bladder spasms   . Complication of anesthesia   . Hyperlipidemia   . GERD (gastroesophageal reflux disease)   . Neurogenic bladder   . Stroke 2013  . CAD (coronary artery disease)     Past Surgical History  Procedure Laterality Date  . Right tkr    . Gastroplasty    . Cholecystectomy    . Total knee arthroplasty    . Left heart catheterization with coronary angiogram N/A 10/24/2011    Procedure: LEFT HEART CATHETERIZATION WITH CORONARY ANGIOGRAM;  Surgeon: Candee Furbish, MD;  Location: [facility]  CATH LAB;  Service: Cardiovascular;  Laterality: N/A;  right radial artery approach    Patient Care Team: Irven Shelling, MD as PCP - General (Internal Medicine)  History   Social History  . Marital Status: Single    Spouse Name: N/A    Number of Children: N/A  . Years of Education: N/A   Occupational History  . Not on file.   Social History Main Topics  . Smoking status: Former Smoker -- 2.00 packs/day for 20 years    Quit date: 09/10/1976  . Smokeless tobacco: Never Used  . Alcohol Use: No     Comment: FORMER ALCOHOLIC WITH 23 YEAR SOBRIETY  . Drug Use: No  . Sexual Activity: No   Other Topics Concern  . Not on file   Social History Narrative     reports that he quit smoking about 38 years ago. He has never used smokeless tobacco. He reports that he does not drink alcohol or use illicit drugs.  Family History  Problem Relation Age of Onset  . Hypertension Mother   . Diabetes type I Father   . Cancer Brother      Testicular Cancer  . Cancer Brother     Leukemia   Family Status  Relation Status Death Age  . Father Deceased     DM/DVT  .  Mother Deceased     old age  . Brother Alive   . Brother Marketing executive History  Administered Date(s) Administered  . Influenza Split 10/12/2011, 10/13/2011    Allergies  Allergen Reactions  . Ace Inhibitors Swelling  . Lipitor [Atorvastatin Calcium] Swelling  . Metformin And Related Swelling  . Cefadroxil Hives  . Cephalexin Hives  . Morphine And Related Other (See Comments)    Sweating, feels like is "in rocky boat."  . Robaxin [Methocarbamol] Other (See Comments)    Feels like he is shaky    Medications: Patient's Medications  New Prescriptions   No medications on file  Previous Medications   ASPIRIN EC 81 MG TABLET    Take 81 mg by mouth every morning.    DIPHENHYDRAMINE (SOMINEX) 25 MG TABLET    Take 25 mg by mouth at bedtime as needed for sleep.   DIPHENOXYLATE-ATROPINE (LOMOTIL)  2.5-0.025 MG PER TABLET    Take 1 tablet by mouth 3 (three) times daily as needed. For diarrhea   FEXOFENADINE (ALLEGRA) 180 MG TABLET    Take 180 mg by mouth at bedtime as needed for allergies.    FUROSEMIDE (LASIX) 40 MG TABLET    Take 1 tablet (40 mg total) by mouth daily.   HUMULIN 70/30 (70-30) 100 UNIT/ML INJECTION    Inject 40 Units into the skin 2 (two) times daily.   HYDROCODONE-ACETAMINOPHEN (NORCO/VICODIN) 5-325 MG PER TABLET    Take 1-2 tablets by mouth every 4 (four) hours as needed for moderate pain.   ISOSORBIDE MONONITRATE (IMDUR) 30 MG 24 HR TABLET    Take 1 tablet (30 mg total) by mouth daily.   METOPROLOL TARTRATE (LOPRESSOR) 25 MG TABLET    Take 1 tablet (25 mg total) by mouth 2 (two) times daily.   NITROGLYCERIN (NITROSTAT) 0.4 MG SL TABLET    Place 0.4 mg under the tongue every 5 (five) minutes as needed for chest pain.   OMEPRAZOLE (PRILOSEC) 20 MG CAPSULE    Take 20 mg by mouth daily.   OXYBUTYNIN (DITROPAN) 5 MG TABLET    Take 5 mg by mouth 3 (three) times daily as needed for bladder spasms.   SENNOSIDES 25 MG TABS    Take 25 mg by mouth daily as needed (for constipation).   SULFAMETHOXAZOLE-TRIMETHOPRIM (BACTRIM DS,SEPTRA DS) 800-160 MG PER TABLET    Take 1 tablet by mouth every 12 (twelve) hours.  Modified Medications   No medications on file  Discontinued Medications   No medications on file    Review of Systems  Constitutional: Positive for appetite change and unexpected weight change (down 7 lbs since admission to SNF). Negative for fever, chills and fatigue.  HENT: Negative for dental problem, ear pain and sore throat.   Eyes: Negative for visual disturbance.  Respiratory: Negative for cough and chest tightness.   Cardiovascular: Positive for leg swelling. Negative for chest pain.  Gastrointestinal: Negative for nausea, vomiting and abdominal pain.  Genitourinary: Positive for difficulty urinating.  Musculoskeletal: Positive for arthralgias and gait problem.    Skin: Negative for rash.  Neurological: Positive for light-headedness. Negative for dizziness, seizures and headaches.       Difficulty with word finding occasionally  Psychiatric/Behavioral: Positive for decreased concentration. Negative for sleep disturbance. The patient is not nervous/anxious.        Filed Vitals:   09/07/14 1348  BP: 128/88  Pulse: 76  Temp: 97.6 F (36.4 C)  Weight: 345 lb (156.491  kg)   Body mass index is 45.53 kg/(m^2).  Physical Exam  CONSTITUTIONAL: Looks well in NAD. Awake, alert and oriented x 3 HEENT: PERRLA. Oropharynx clear and without exudate NECK: Supple. Nontender. No palpable cervical or supraclavicular lymph nodes. No carotid bruit b/l. No thyromegaly or thyroid mass palpable.  CVS: Regular rate with 1/6 SEmurmur. No gallop or rub. LUNGS: CTA b/l no wheezing, rales or rhonchi. ABDOMEN: Bowel sounds present x 4. Soft, nontender, nondistended. No palpable mass or bruit EXTREMITIES: +1 pitting LE edema b/l. Distal pulses palpable. No calf tenderness. Chronic venous stasis changes GU: folel cath intact and clear yellow urine DTG PSYCH: Affect, behavior and mood normal  Labs reviewed: Orders Only on 09/06/2014  Component Date Value Ref Range Status  . WBC 09/06/2014 7.5  4.0 - 10.5 K/uL Final  . RBC 09/06/2014 4.76  4.22 - 5.81 MIL/uL Final  . Hemoglobin 09/06/2014 14.0  13.0 - 17.0 g/dL Final  . HCT 09/06/2014 41.0  39.0 - 52.0 % Final  . MCV 09/06/2014 86.1  78.0 - 100.0 fL Final  . MCH 09/06/2014 29.4  26.0 - 34.0 pg Final  . MCHC 09/06/2014 34.1  30.0 - 36.0 g/dL Final  . RDW 09/06/2014 13.6  11.5 - 15.5 % Final  . Platelets 09/06/2014 215  150 - 400 K/uL Final  . MPV 09/06/2014 9.7  9.4 - 12.4 fL Final  . Neutrophils Relative % 09/06/2014 57  43 - 77 % Final  . Neutro Abs 09/06/2014 4.3  1.7 - 7.7 K/uL Final  . Lymphocytes Relative 09/06/2014 27  12 - 46 % Final  . Lymphs Abs 09/06/2014 2.0  0.7 - 4.0 K/uL Final  . Monocytes  Relative 09/06/2014 9  3 - 12 % Final  . Monocytes Absolute 09/06/2014 0.7  0.1 - 1.0 K/uL Final  . Eosinophils Relative 09/06/2014 5  0 - 5 % Final  . Eosinophils Absolute 09/06/2014 0.4  0.0 - 0.7 K/uL Final  . Basophils Relative 09/06/2014 2* 0 - 1 % Final  . Basophils Absolute 09/06/2014 0.2* 0.0 - 0.1 K/uL Final  . Smear Review 09/06/2014 Criteria for review not met   Final  . Sodium 09/06/2014 134* 135 - 145 mEq/L Final  . Potassium 09/06/2014 4.4  3.5 - 5.3 mEq/L Final  . Chloride 09/06/2014 98  96 - 112 mEq/L Final  . CO2 09/06/2014 23  19 - 32 mEq/L Final  . Glucose, Bld 09/06/2014 196* 70 - 99 mg/dL Final  . BUN 09/06/2014 15  6 - 23 mg/dL Final  . Creat 09/06/2014 0.78  0.50 - 1.35 mg/dL Final  . Calcium 09/06/2014 8.7  8.4 - 10.5 mg/dL Final  Admission on 08/27/2014, Discharged on 08/31/2014  Component Date Value Ref Range Status  . Sodium 08/27/2014 135* 137 - 147 mEq/L Final  . Potassium 08/27/2014 4.2  3.7 - 5.3 mEq/L Final  . Chloride 08/27/2014 94* 96 - 112 mEq/L Final  . CO2 08/27/2014 27  19 - 32 mEq/L Final  . Glucose, Bld 08/27/2014 209* 70 - 99 mg/dL Final  . BUN 08/27/2014 11  6 - 23 mg/dL Final  . Creatinine, Ser 08/27/2014 0.64  0.50 - 1.35 mg/dL Final  . Calcium 08/27/2014 9.3  8.4 - 10.5 mg/dL Final  . Total Protein 08/27/2014 8.0  6.0 - 8.3 g/dL Final  . Albumin 08/27/2014 3.5  3.5 - 5.2 g/dL Final  . AST 08/27/2014 12  0 - 37 U/L Final  . ALT 08/27/2014 10  0 - 53 U/L Final  . Alkaline Phosphatase 08/27/2014 112  39 - 117 U/L Final  . Total Bilirubin 08/27/2014 0.5  0.3 - 1.2 mg/dL Final  . GFR calc non Af Amer 08/27/2014 >90  >90 mL/min Final  . GFR calc Af Amer 08/27/2014 >90  >90 mL/min Final   Comment: (NOTE) The eGFR has been calculated using the CKD EPI equation. This calculation has not been validated in all clinical situations. eGFR's persistently <90 mL/min signify possible Chronic Kidney Disease.   . Anion gap 08/27/2014 14  5 - 15 Final   . Pro B Natriuretic peptide (BNP) 08/27/2014 1121.0* 0 - 125 pg/mL Final  . WBC 08/27/2014 8.5  4.0 - 10.5 K/uL Final  . RBC 08/27/2014 5.06  4.22 - 5.81 MIL/uL Final  . Hemoglobin 08/27/2014 15.0  13.0 - 17.0 g/dL Final  . HCT 08/27/2014 44.1  39.0 - 52.0 % Final  . MCV 08/27/2014 87.2  78.0 - 100.0 fL Final  . MCH 08/27/2014 29.6  26.0 - 34.0 pg Final  . MCHC 08/27/2014 34.0  30.0 - 36.0 g/dL Final  . RDW 08/27/2014 13.5  11.5 - 15.5 % Final  . Platelets 08/27/2014 215  150 - 400 K/uL Final  . Neutrophils Relative % 08/27/2014 73  43 - 77 % Final  . Neutro Abs 08/27/2014 6.2  1.7 - 7.7 K/uL Final  . Lymphocytes Relative 08/27/2014 16  12 - 46 % Final  . Lymphs Abs 08/27/2014 1.4  0.7 - 4.0 K/uL Final  . Monocytes Relative 08/27/2014 7  3 - 12 % Final  . Monocytes Absolute 08/27/2014 0.6  0.1 - 1.0 K/uL Final  . Eosinophils Relative 08/27/2014 4  0 - 5 % Final  . Eosinophils Absolute 08/27/2014 0.3  0.0 - 0.7 K/uL Final  . Basophils Relative 08/27/2014 1  0 - 1 % Final  . Basophils Absolute 08/27/2014 0.1  0.0 - 0.1 K/uL Final  . Glucose-Capillary 08/27/2014 203* 70 - 99 mg/dL Final  . Comment 1 08/27/2014 Notify RN   Final  . Color, Urine 08/27/2014 YELLOW  YELLOW Final  . APPearance 08/27/2014 CLEAR  CLEAR Final  . Specific Gravity, Urine 08/27/2014 1.003* 1.005 - 1.030 Final  . pH 08/27/2014 7.5  5.0 - 8.0 Final  . Glucose, UA 08/27/2014 NEGATIVE  NEGATIVE mg/dL Final  . Hgb urine dipstick 08/27/2014 SMALL* NEGATIVE Final  . Bilirubin Urine 08/27/2014 NEGATIVE  NEGATIVE Final  . Ketones, ur 08/27/2014 NEGATIVE  NEGATIVE mg/dL Final  . Protein, ur 08/27/2014 30* NEGATIVE mg/dL Final  . Urobilinogen, UA 08/27/2014 1.0  0.0 - 1.0 mg/dL Final  . Nitrite 08/27/2014 NEGATIVE  NEGATIVE Final  . Leukocytes, UA 08/27/2014 LARGE* NEGATIVE Final  . Specimen Description 08/27/2014 URINE, RANDOM   Final  . Special Requests 08/27/2014 BAGGED URINE   Final  . Culture  Setup Time 08/27/2014     Final                   Value:08/27/2014 23:10 Performed at [facility]   . Colony Count 08/27/2014    Final                   Value:75,000 COLONIES/ML Performed at [facility]   . Culture 08/27/2014    Final                   Value:Multiple bacterial morphotypes present, none predominant. Suggest appropriate recollection if clinically indicated. Performed at  [facility]   . Report Status 08/27/2014 08/28/2014 FINAL   Final  . Opiates 08/27/2014 NONE DETECTED  NONE DETECTED Final  . Cocaine 08/27/2014 NONE DETECTED  NONE DETECTED Final  . Benzodiazepines 08/27/2014 NONE DETECTED  NONE DETECTED Final  . Amphetamines 08/27/2014 NONE DETECTED  NONE DETECTED Final  . Tetrahydrocannabinol 08/27/2014 NONE DETECTED  NONE DETECTED Final  . Barbiturates 08/27/2014 NONE DETECTED  NONE DETECTED Final   Comment:        DRUG SCREEN FOR MEDICAL PURPOSES ONLY.  IF CONFIRMATION IS NEEDED FOR ANY PURPOSE, NOTIFY LAB WITHIN 5 DAYS.        LOWEST DETECTABLE LIMITS FOR URINE DRUG SCREEN Drug Class       Cutoff (ng/mL) Amphetamine      1000 Barbiturate      200 Benzodiazepine   202 Tricyclics       542 Opiates          300 Cocaine          300 THC              50   . Ammonia 08/27/2014 15  11 - 60 umol/L Final  . Specimen Description 08/27/2014 URINE, RANDOM   Final  . Special Requests 08/27/2014 BAGGED URINE   Final  . Gram Stain 08/27/2014    Final                   Value:CYTOSPIN PREP WBC PRESENT,BOTH PMN AND MONONUCLEAR GRAM POSITIVE COCCI IN PAIRS IN CLUSTERS GRAM NEGATIVE RODS   . Report Status 08/27/2014 08/27/2014 FINAL   Final  . Specimen Description 08/27/2014 BLOOD ARM LEFT   Final  . Special Requests 08/27/2014 BOTTLES DRAWN AEROBIC ONLY 10CC   Final  . Culture  Setup Time 08/27/2014    Final                   Value:08/27/2014 21:12 Performed at [facility]   . Culture 08/27/2014    Final                   Value:NO GROWTH 5  DAYS Performed at [facility]   . Report Status 08/27/2014 09/02/2014 FINAL   Final  . Specimen Description 08/27/2014 BLOOD ARM RIGHT   Final  . Special Requests 08/27/2014 BOTTLES DRAWN AEROBIC AND ANAEROBIC 10CC   Final  . Culture  Setup Time 08/27/2014    Final                   Value:08/27/2014 21:10 Performed at [facility]   . Culture 08/27/2014    Final                   Value:NO GROWTH 5 DAYS Performed at [facility]   . Report Status 08/27/2014 09/02/2014 FINAL   Final  . Lipase 08/27/2014 18  11 - 59 U/L Final  . Squamous Epithelial / LPF 08/27/2014 FEW* RARE Final  . WBC, UA 08/27/2014 11-20  <3 WBC/hpf Final  . RBC / HPF 08/27/2014 0-2  <3 RBC/hpf Final  . Bacteria, UA 08/27/2014 FEW* RARE Final  . TSH 08/27/2014 2.950  0.350 - 4.500 uIU/mL Final  . Troponin I 08/27/2014 <0.30  <0.30 ng/mL Final   Comment:        Due to the release kinetics of cTnI, a negative result within the first hours of the onset of symptoms does not rule out myocardial infarction with  certainty. If myocardial infarction is still suspected, repeat the test at appropriate intervals.   . Troponin I 08/28/2014 <0.30  <0.30 ng/mL Final   Comment:        Due to the release kinetics of cTnI, a negative result within the first hours of the onset of symptoms does not rule out myocardial infarction with certainty. If myocardial infarction is still suspected, repeat the test at appropriate intervals.   . Troponin I 08/28/2014 <0.30  <0.30 ng/mL Final   Comment:        Due to the release kinetics of cTnI, a negative result within the first hours of the onset of symptoms does not rule out myocardial infarction with certainty. If myocardial infarction is still suspected, repeat the test at appropriate intervals.   Marland Kitchen MRSA by PCR 08/27/2014 POSITIVE* NEGATIVE Final   Comment:        The GeneXpert MRSA Assay (FDA approved for NASAL specimens only), is one component of  a comprehensive MRSA colonization surveillance program. It is not intended to diagnose MRSA infection nor to guide or monitor treatment for MRSA infections. RESULT CALLED TO, READ BACK BY AND VERIFIED WITH: R.PEARCE ,RN AT 0102 BY L.PITT 08/28/14   . Glucose-Capillary 08/27/2014 171* 70 - 99 mg/dL Final  . Glucose-Capillary 08/28/2014 212* 70 - 99 mg/dL Final  . Comment 1 08/28/2014 Documented in Chart   Final  . Glucose-Capillary 08/28/2014 209* 70 - 99 mg/dL Final  . Comment 1 08/28/2014 Documented in Chart   Final  . Sodium 08/29/2014 135* 137 - 147 mEq/L Final  . Potassium 08/29/2014 4.2  3.7 - 5.3 mEq/L Final  . Chloride 08/29/2014 99  96 - 112 mEq/L Final  . CO2 08/29/2014 25  19 - 32 mEq/L Final  . Glucose, Bld 08/29/2014 152* 70 - 99 mg/dL Final  . BUN 08/29/2014 10  6 - 23 mg/dL Final  . Creatinine, Ser 08/29/2014 0.66  0.50 - 1.35 mg/dL Final  . Calcium 08/29/2014 8.8  8.4 - 10.5 mg/dL Final  . GFR calc non Af Amer 08/29/2014 >90  >90 mL/min Final  . GFR calc Af Amer 08/29/2014 >90  >90 mL/min Final   Comment: (NOTE) The eGFR has been calculated using the CKD EPI equation. This calculation has not been validated in all clinical situations. eGFR's persistently <90 mL/min signify possible Chronic Kidney Disease.   . Anion gap 08/29/2014 11  5 - 15 Final  . WBC 08/29/2014 7.1  4.0 - 10.5 K/uL Final  . RBC 08/29/2014 4.70  4.22 - 5.81 MIL/uL Final  . Hemoglobin 08/29/2014 13.6  13.0 - 17.0 g/dL Final  . HCT 08/29/2014 41.3  39.0 - 52.0 % Final  . MCV 08/29/2014 87.9  78.0 - 100.0 fL Final  . MCH 08/29/2014 28.9  26.0 - 34.0 pg Final  . MCHC 08/29/2014 32.9  30.0 - 36.0 g/dL Final  . RDW 08/29/2014 13.5  11.5 - 15.5 % Final  . Platelets 08/29/2014 218  150 - 400 K/uL Final  . Glucose-Capillary 08/28/2014 272* 70 - 99 mg/dL Final  . Glucose-Capillary 08/28/2014 192* 70 - 99 mg/dL Final  . Comment 1 08/28/2014 Notify RN   Final  . Glucose-Capillary 08/29/2014 134* 70 -  99 mg/dL Final  . Glucose-Capillary 08/29/2014 142* 70 - 99 mg/dL Final  . Comment 1 08/29/2014 Notify RN   Final  . Comment 2 08/29/2014 Documented in Chart   Final  . Glucose-Capillary 08/29/2014 157* 70 - 99 mg/dL Final  .  Glucose-Capillary 08/29/2014 139* 70 - 99 mg/dL Final  . Glucose-Capillary 08/29/2014 173* 70 - 99 mg/dL Final  . Glucose-Capillary 08/29/2014 184* 70 - 99 mg/dL Final  . Glucose-Capillary 08/30/2014 207* 70 - 99 mg/dL Final  . Troponin I 08/30/2014 <0.30  <0.30 ng/mL Final   Comment:        Due to the release kinetics of cTnI, a negative result within the first hours of the onset of symptoms does not rule out myocardial infarction with certainty. If myocardial infarction is still suspected, repeat the test at appropriate intervals.   . Troponin I 08/30/2014 <0.30  <0.30 ng/mL Final   Comment:        Due to the release kinetics of cTnI, a negative result within the first hours of the onset of symptoms does not rule out myocardial infarction with certainty. If myocardial infarction is still suspected, repeat the test at appropriate intervals.   . Troponin I 08/30/2014 <0.30  <0.30 ng/mL Final   Comment:        Due to the release kinetics of cTnI, a negative result within the first hours of the onset of symptoms does not rule out myocardial infarction with certainty. If myocardial infarction is still suspected, repeat the test at appropriate intervals.   . Glucose-Capillary 08/30/2014 184* 70 - 99 mg/dL Final  . Glucose-Capillary 08/30/2014 243* 70 - 99 mg/dL Final  . Glucose-Capillary 08/30/2014 174* 70 - 99 mg/dL Final  . WBC 08/31/2014 6.3  4.0 - 10.5 K/uL Final  . RBC 08/31/2014 4.34  4.22 - 5.81 MIL/uL Final  . Hemoglobin 08/31/2014 12.5* 13.0 - 17.0 g/dL Final  . HCT 08/31/2014 38.0* 39.0 - 52.0 % Final  . MCV 08/31/2014 87.6  78.0 - 100.0 fL Final  . MCH 08/31/2014 28.8  26.0 - 34.0 pg Final  . MCHC 08/31/2014 32.9  30.0 - 36.0 g/dL Final  .  RDW 08/31/2014 13.4  11.5 - 15.5 % Final  . Platelets 08/31/2014 188  150 - 400 K/uL Final  . Sodium 08/31/2014 135  135 - 145 mmol/L Final   Please note change in reference range.  . Potassium 08/31/2014 4.2  3.5 - 5.1 mmol/L Final   Please note change in reference range.  . Chloride 08/31/2014 100  96 - 112 mEq/L Final  . CO2 08/31/2014 30  19 - 32 mmol/L Final  . Glucose, Bld 08/31/2014 223* 70 - 99 mg/dL Final  . BUN 08/31/2014 8  6 - 23 mg/dL Final  . Creatinine, Ser 08/31/2014 0.97  0.50 - 1.35 mg/dL Final  . Calcium 08/31/2014 8.5  8.4 - 10.5 mg/dL Final  . Total Protein 08/31/2014 5.7* 6.0 - 8.3 g/dL Final  . Albumin 08/31/2014 2.9* 3.5 - 5.2 g/dL Final  . AST 08/31/2014 12  0 - 37 U/L Final  . ALT 08/31/2014 10  0 - 53 U/L Final  . Alkaline Phosphatase 08/31/2014 71  39 - 117 U/L Final  . Total Bilirubin 08/31/2014 0.3  0.3 - 1.2 mg/dL Final  . GFR calc non Af Amer 08/31/2014 84* >90 mL/min Final  . GFR calc Af Amer 08/31/2014 >90  >90 mL/min Final   Comment: (NOTE) The eGFR has been calculated using the CKD EPI equation. This calculation has not been validated in all clinical situations. eGFR's persistently <90 mL/min signify possible Chronic Kidney Disease.   . Anion gap 08/31/2014 5  5 - 15 Final  . Glucose-Capillary 08/30/2014 133* 70 - 99 mg/dL Final  . Glucose-Capillary  08/31/2014 192* 70 - 99 mg/dL Final  . Glucose-Capillary 08/31/2014 86  70 - 99 mg/dL Final  . Glucose-Capillary 08/31/2014 105* 70 - 99 mg/dL Final  Admission on 06/24/2014, Discharged on 06/29/2014  Component Date Value Ref Range Status  . WBC 06/24/2014 12.4* 4.0 - 10.5 K/uL Final  . RBC 06/24/2014 4.40  4.22 - 5.81 MIL/uL Final  . Hemoglobin 06/24/2014 13.2  13.0 - 17.0 g/dL Final  . HCT 06/24/2014 39.0  39.0 - 52.0 % Final  . MCV 06/24/2014 88.6  78.0 - 100.0 fL Final  . MCH 06/24/2014 30.0  26.0 - 34.0 pg Final  . MCHC 06/24/2014 33.8  30.0 - 36.0 g/dL Final  . RDW 06/24/2014 13.6  11.5 -  15.5 % Final  . Platelets 06/24/2014 217  150 - 400 K/uL Final  . Neutrophils Relative % 06/24/2014 82* 43 - 77 % Final  . Neutro Abs 06/24/2014 10.1* 1.7 - 7.7 K/uL Final  . Lymphocytes Relative 06/24/2014 9* 12 - 46 % Final  . Lymphs Abs 06/24/2014 1.2  0.7 - 4.0 K/uL Final  . Monocytes Relative 06/24/2014 7  3 - 12 % Final  . Monocytes Absolute 06/24/2014 0.8  0.1 - 1.0 K/uL Final  . Eosinophils Relative 06/24/2014 2  0 - 5 % Final  . Eosinophils Absolute 06/24/2014 0.3  0.0 - 0.7 K/uL Final  . Basophils Relative 06/24/2014 0  0 - 1 % Final  . Basophils Absolute 06/24/2014 0.1  0.0 - 0.1 K/uL Final  . Sodium 06/24/2014 134* 137 - 147 mEq/L Final  . Potassium 06/24/2014 3.4* 3.7 - 5.3 mEq/L Final  . Chloride 06/24/2014 96  96 - 112 mEq/L Final  . CO2 06/24/2014 26  19 - 32 mEq/L Final  . Glucose, Bld 06/24/2014 266* 70 - 99 mg/dL Final  . BUN 06/24/2014 5* 6 - 23 mg/dL Final  . Creatinine, Ser 06/24/2014 0.77  0.50 - 1.35 mg/dL Final  . Calcium 06/24/2014 8.1* 8.4 - 10.5 mg/dL Final  . GFR calc non Af Amer 06/24/2014 >90  >90 mL/min Final  . GFR calc Af Amer 06/24/2014 >90  >90 mL/min Final   Comment: (NOTE)                          The eGFR has been calculated using the CKD EPI equation.                          This calculation has not been validated in all clinical situations.                          eGFR's persistently <90 mL/min signify possible Chronic Kidney                          Disease.  . Anion gap 06/24/2014 12  5 - 15 Final  . Color, Urine 06/24/2014 RED* YELLOW Final   BIOCHEMICALS MAY BE AFFECTED BY COLOR  . APPearance 06/24/2014 CLOUDY* CLEAR Final  . Specific Gravity, Urine 06/24/2014 1.013  1.005 - 1.030 Final  . pH 06/24/2014 7.0  5.0 - 8.0 Final  . Glucose, UA 06/24/2014 500* NEGATIVE mg/dL Final  . Hgb urine dipstick 06/24/2014 LARGE* NEGATIVE Final  . Bilirubin Urine 06/24/2014 NEGATIVE  NEGATIVE Final  . Ketones, ur 06/24/2014 NEGATIVE  NEGATIVE mg/dL  Final  . Protein, ur  06/24/2014 100* NEGATIVE mg/dL Final  . Urobilinogen, UA 06/24/2014 1.0  0.0 - 1.0 mg/dL Final  . Nitrite 06/24/2014 NEGATIVE  NEGATIVE Final  . Leukocytes, UA 06/24/2014 NEGATIVE  NEGATIVE Final  . Lactic Acid, Venous 06/24/2014 1.97  0.5 - 2.2 mmol/L Final  . Specimen Description 06/24/2014 URINE, CLEAN CATCH   Final  . Special Requests 06/24/2014 Immunocompromised   Final  . Culture  Setup Time 06/24/2014    Final                   Value:06/25/2014 04:14                         Performed at [facility]  . Colony Count 06/24/2014    Final                   Value:NO GROWTH                         Performed at [facility]  . Culture 06/24/2014    Final                   Value:NO GROWTH                         Performed at [facility]  . Report Status 06/24/2014 06/25/2014 FINAL   Final  . Glucose-Capillary 06/24/2014 228* 70 - 99 mg/dL Final  . Comment 1 06/24/2014 Notify RN   Final  . Comment 2 06/24/2014 Documented in Chart   Final  . RBC / HPF 06/24/2014 TOO NUMEROUS TO COUNT  <3 RBC/hpf Final  . Urine-Other 06/24/2014 URINALYSIS PERFORMED ON SUPERNATANT   Final  . Hgb A1c MFr Bld 06/25/2014 8.9* <5.7 % Final   Comment: (NOTE)                                                                                                                         According to the ADA Clinical Practice Recommendations for 2011, when                          HbA1c is used as a screening test:                           >=6.5%   Diagnostic of Diabetes Mellitus                                    (if abnormal result is confirmed)                          5.7-6.4%   Increased risk of developing Diabetes Mellitus  References:Diagnosis and Classification of Diabetes Mellitus,Diabetes                          [id number],7017,[id number](TJQZE 1):S62-S69 and Standards of Medical Care in                                  Diabetes - 2011,Diabetes  SPQZ,3007,62 (Suppl 1):S11-S61.  . Mean Plasma Glucose 06/25/2014 209* <117 mg/dL Final   Performed at [facility]  . Troponin I 06/25/2014 <0.30  <0.30 ng/mL Final   Comment:                                 Due to the release kinetics of cTnI,                          a negative result within the first hours                          of the onset of symptoms does not rule out                          myocardial infarction with certainty.                          If myocardial infarction is still suspected,                          repeat the test at appropriate intervals.  . Magnesium 06/25/2014 1.7  1.5 - 2.5 mg/dL Final  . Glucose-Capillary 06/25/2014 272* 70 - 99 mg/dL Final  . MRSA by PCR 06/25/2014 POSITIVE* NEGATIVE Final   Comment:                                 The GeneXpert MRSA Assay (FDA                          approved for NASAL specimens                          only), is one component of a                          comprehensive MRSA colonization                          surveillance program. It is not                          intended to diagnose MRSA                          infection nor to guide or                          monitor treatment for  MRSA infections.                          CRITICAL RESULT CALLED TO, READ BACK BY AND VERIFIED WITH:                          Bernita Raisin RN 06/25/14 0707 COSTELLO B  . Vitamin B-12 06/25/2014 359  211 - 911 pg/mL Final   Performed at [facility]  . TSH 06/25/2014 1.620  0.350 - 4.500 uIU/mL Final  . Vitamin B1 (Thiamine) 06/25/2014 9  8 - 30 nmol/L Final   Performed at [facility]  . Sodium 06/26/2014 134* 137 - 147 mEq/L Final  . Potassium 06/26/2014 3.4* 3.7 - 5.3 mEq/L Final  . Chloride 06/26/2014 98  96 - 112 mEq/L Final  . CO2 06/26/2014 25  19 - 32 mEq/L Final  . Glucose, Bld 06/26/2014 172* 70 - 99 mg/dL Final  . BUN 06/26/2014 6  6 - 23 mg/dL Final  . Creatinine,  Ser 06/26/2014 0.60  0.50 - 1.35 mg/dL Final  . Calcium 06/26/2014 8.0* 8.4 - 10.5 mg/dL Final  . GFR calc non Af Amer 06/26/2014 >90  >90 mL/min Final  . GFR calc Af Amer 06/26/2014 >90  >90 mL/min Final   Comment: (NOTE)                          The eGFR has been calculated using the CKD EPI equation.                          This calculation has not been validated in all clinical situations.                          eGFR's persistently <90 mL/min signify possible Chronic Kidney                          Disease.  . Anion gap 06/26/2014 11  5 - 15 Final  . WBC 06/26/2014 7.2  4.0 - 10.5 K/uL Final  . RBC 06/26/2014 4.27  4.22 - 5.81 MIL/uL Final  . Hemoglobin 06/26/2014 12.9* 13.0 - 17.0 g/dL Final  . HCT 06/26/2014 38.5* 39.0 - 52.0 % Final  . MCV 06/26/2014 90.2  78.0 - 100.0 fL Final  . MCH 06/26/2014 30.2  26.0 - 34.0 pg Final  . MCHC 06/26/2014 33.5  30.0 - 36.0 g/dL Final  . RDW 06/26/2014 13.8  11.5 - 15.5 % Final  . Platelets 06/26/2014 181  150 - 400 K/uL Final  . Troponin I 06/26/2014 <0.30  <0.30 ng/mL Final   Comment:                                 Due to the release kinetics of cTnI,                          a negative result within the first hours                          of the onset of symptoms does not rule out  myocardial infarction with certainty.                          If myocardial infarction is still suspected,                          repeat the test at appropriate intervals.  . Troponin I 06/26/2014 <0.30  <0.30 ng/mL Final   Comment:                                 Due to the release kinetics of cTnI,                          a negative result within the first hours                          of the onset of symptoms does not rule out                          myocardial infarction with certainty.                          If myocardial infarction is still suspected,                          repeat the test at appropriate intervals.   . Troponin I 06/27/2014 <0.30  <0.30 ng/mL Final   Comment:                                 Due to the release kinetics of cTnI,                          a negative result within the first hours                          of the onset of symptoms does not rule out                          myocardial infarction with certainty.                          If myocardial infarction is still suspected,                          repeat the test at appropriate intervals.  . Sodium 06/27/2014 134* 137 - 147 mEq/L Final  . Potassium 06/27/2014 3.5* 3.7 - 5.3 mEq/L Final  . Chloride 06/27/2014 97  96 - 112 mEq/L Final  . CO2 06/27/2014 25  19 - 32 mEq/L Final  . Glucose, Bld 06/27/2014 222* 70 - 99 mg/dL Final  . BUN 06/27/2014 7  6 - 23 mg/dL Final  . Creatinine, Ser 06/27/2014 0.62  0.50 - 1.35 mg/dL Final  . Calcium 06/27/2014 8.1* 8.4 - 10.5 mg/dL Final  . GFR calc non Af Amer 06/27/2014 >90  >90 mL/min Final  . GFR calc Af Amer 06/27/2014 >90  >90 mL/min Final   Comment: (NOTE)  The eGFR has been calculated using the CKD EPI equation.                          This calculation has not been validated in all clinical situations.                          eGFR's persistently <90 mL/min signify possible Chronic Kidney                          Disease.  . Anion gap 06/27/2014 12  5 - 15 Final  . Glucose-Capillary 06/28/2014 223* 70 - 99 mg/dL Final  . Comment 1 06/28/2014 Documented in Chart   Final  . Comment 2 06/28/2014 Notify RN   Final  . Glucose-Capillary 06/27/2014 219* 70 - 99 mg/dL Final  . Comment 1 06/27/2014 Notify RN   Final  . Glucose-Capillary 06/27/2014 261* 70 - 99 mg/dL Final  . Comment 1 06/27/2014 Documented in Chart   Final  . Comment 2 06/27/2014 Notify RN   Final  . Glucose-Capillary 06/27/2014 281* 70 - 99 mg/dL Final  . Comment 1 06/27/2014 Documented in Chart   Final  . Comment 2 06/27/2014 Notify RN   Final  . Glucose-Capillary 06/27/2014 241* 70  - 99 mg/dL Final  . Comment 1 06/27/2014 Documented in Chart   Final  . Comment 2 06/27/2014 Notify RN   Final  . Glucose-Capillary 06/26/2014 210* 70 - 99 mg/dL Final  . Comment 1 06/26/2014 Notify RN   Final  . Glucose-Capillary 06/26/2014 224* 70 - 99 mg/dL Final  . Comment 1 06/26/2014 Documented in Chart   Final  . Comment 2 06/26/2014 Notify RN   Final  . Glucose-Capillary 06/26/2014 232* 70 - 99 mg/dL Final  . Comment 1 06/26/2014 Documented in Chart   Final  . Comment 2 06/26/2014 Notify RN   Final  . Glucose-Capillary 06/26/2014 165* 70 - 99 mg/dL Final  . Comment 1 06/26/2014 Documented in Chart   Final  . Comment 2 06/26/2014 Notify RN   Final  . Glucose-Capillary 06/25/2014 169* 70 - 99 mg/dL Final  . Comment 1 06/25/2014 Notify RN   Final  . Glucose-Capillary 06/28/2014 263* 70 - 99 mg/dL Final  . Comment 1 06/28/2014 Documented in Chart   Final  . Comment 2 06/28/2014 Notify RN   Final  . Sodium 06/28/2014 136* 137 - 147 mEq/L Final  . Potassium 06/28/2014 3.9  3.7 - 5.3 mEq/L Final  . Chloride 06/28/2014 97  96 - 112 mEq/L Final  . CO2 06/28/2014 27  19 - 32 mEq/L Final  . Glucose, Bld 06/28/2014 204* 70 - 99 mg/dL Final  . BUN 06/28/2014 8  6 - 23 mg/dL Final  . Creatinine, Ser 06/28/2014 0.74  0.50 - 1.35 mg/dL Final  . Calcium 06/28/2014 8.2* 8.4 - 10.5 mg/dL Final  . GFR calc non Af Amer 06/28/2014 >90  >90 mL/min Final  . GFR calc Af Amer 06/28/2014 >90  >90 mL/min Final   Comment: (NOTE)                          The eGFR has been calculated using the CKD EPI equation.  This calculation has not been validated in all clinical situations.                          eGFR's persistently <90 mL/min signify possible Chronic Kidney                          Disease.  . Anion gap 06/28/2014 12  5 - 15 Final  . Glucose-Capillary 06/28/2014 198* 70 - 99 mg/dL Final  . Comment 1 06/28/2014 Documented in Chart   Final  . Comment 2 06/28/2014 Notify RN    Final  . Glucose-Capillary 06/28/2014 208* 70 - 99 mg/dL Final  . Comment 1 06/28/2014 Notify RN   Final  . Glucose-Capillary 06/29/2014 186* 70 - 99 mg/dL Final  . Comment 1 06/29/2014 Documented in Chart   Final  . Comment 2 06/29/2014 Notify RN   Final  . Glucose-Capillary 06/29/2014 260* 70 - 99 mg/dL Final  . Comment 1 06/29/2014 Documented in Chart   Final  . Comment 2 06/29/2014 Notify RN   Final  Hospital Outpatient Visit on 06/24/2014  Component Date Value Ref Range Status  . Glucose-Capillary 06/25/2014 266* 70 - 99 mg/dL Final  . Comment 1 06/25/2014 Documented in Chart   Final  . Comment 2 06/25/2014 Notify RN   Final  . Glucose-Capillary 06/25/2014 253* 70 - 99 mg/dL Final  . Comment 1 06/25/2014 Documented in Chart   Final  . Comment 2 06/25/2014 Notify RN   Final  . Glucose-Capillary 06/25/2014 244* 70 - 99 mg/dL Final  . Comment 1 06/25/2014 Documented in Chart   Final  . Comment 2 06/25/2014 Notify RN   Final    Mr Brain Wo Contrast  08/28/2014   CLINICAL DATA:  68 year old male with new altered mental status. Initial encounter.  EXAM: MRI HEAD WITHOUT CONTRAST  TECHNIQUE: Multiplanar, multiecho pulse sequences of the brain and surrounding structures were obtained without intravenous contrast.  COMPARISON:  Brain MRI 06/25/2014 and earlier.  FINDINGS: Major intracranial vascular flow voids are stable. Stable cerebral volume. No restricted diffusion to suggest acute infarction. No midline shift, mass effect, evidence of mass lesion, ventriculomegaly, extra-axial collection or acute intracranial hemorrhage. Cervicomedullary junction and pituitary are within normal limits. Negative visualized cervical spine. Pearline Cables and white matter signal is stable and within normal limits for a age.  Visible internal auditory structures appear normal. Mastoids are clear. Visualized orbit soft tissues are within normal limits. Trace paranasal sinus mucosal thickening. Visualized scalp soft  tissues are within normal limits. Bone marrow signal within normal limits.  IMPRESSION: No acute intracranial abnormality. Stable and negative for age non contrast MRI appearance of the brain.   Electronically Signed   By: Lars Pinks M.D.   On: 08/28/2014 07:58   Dg Chest Port 1 View  08/27/2014   CLINICAL DATA:  Nausea and vomiting  EXAM: PORTABLE CHEST - 1 VIEW  COMPARISON:  06/25/2014  FINDINGS: Cardiac shadow is mildly enlarged but stable. Mild vascular congestion is seen accentuated by the poor inspiratory effort. No bony abnormality is noted.  IMPRESSION: Mild CHF.   Electronically Signed   By: Inez Catalina M.D.   On: 08/27/2014 16:27   Dg Abd Portable 1v  08/27/2014   CLINICAL DATA:  No bowel movements for 5 days with nausea and vomiting  EXAM: PORTABLE ABDOMEN - 1 VIEW  COMPARISON:  None.  FINDINGS: Scattered large and small  bowel gas is noted. Fecal material is noted within the colon predominately in the ascending colon. No impaction is seen. No free air is noted. No bony abnormality is seen.  IMPRESSION: Moderate stool burden.  No obstructive changes are seen.   Electronically Signed   By: Inez Catalina M.D.   On: 08/27/2014 16:28     Assessment/Plan  Dizziness and giddiness - possibly due to decreased po intake. Consult dietary to develop more appealing menu for patient. Will check CBC w diff and CMP. Change positions slowly when standing from seated position. Continue orthostatic BPs. 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 F/u with cardiology Dr Marlou Porch as scheduled  Physical deconditioning - continue PT/OT/ST   Cordella Register. Perlie Gold  [facility] [address] [place], [place] [address] [phone] Office (Wednesdays and Fridays 8 AM - 5 PM) [phone] Cell (Monday-Friday 8 AM - 5 PM)

## 2014-09-08 ENCOUNTER — Other Ambulatory Visit: Payer: Self-pay | Admitting: Internal Medicine

## 2014-09-08 LAB — CBC WITH DIFFERENTIAL/PLATELET
Basophils Absolute: 0 10*3/uL (ref 0.0–0.1)
Eosinophils Absolute: 0 10*3/uL (ref 0.0–0.7)
HCT: 42.6 % (ref 39.0–52.0)
Hemoglobin: 13.8 g/dL (ref 13.0–17.0)
Lymphocytes Relative: 31 % (ref 12–46)
Lymphs Abs: 2.1 10*3/uL (ref 0.7–4.0)
MCH: 29.4 pg (ref 26.0–34.0)
MCHC: 32.4 g/dL (ref 30.0–36.0)
MCV: 90.8 fL (ref 78.0–100.0)
Monocytes Absolute: 0.6 10*3/uL (ref 0.1–1.0)
Monocytes Relative: 8 % (ref 3–12)
Neutro Abs: 4.2 10*3/uL (ref 1.7–7.7)
Neutrophils Relative %: 61 % (ref 43–77)
Platelets: 173 10*3/uL (ref 150–400)
RBC: 4.69 MIL/uL (ref 4.22–5.81)
RDW: 13.8 % (ref 11.5–15.5)
WBC: 6.9 10*3/uL (ref 4.0–10.5)

## 2014-09-08 LAB — COMPREHENSIVE METABOLIC PANEL
ALT: 16 U/L (ref 0–53)
AST: 17 U/L (ref 0–37)
Albumin: 3.2 g/dL — ABNORMAL LOW (ref 3.5–5.2)
Alkaline Phosphatase: 62 U/L (ref 39–117)
BUN: 15 mg/dL (ref 6–23)
CO2: 24 mEq/L (ref 19–32)
Calcium: 9 mg/dL (ref 8.4–10.5)
Chloride: 99 mEq/L (ref 96–112)
Creat: 0.8 mg/dL (ref 0.50–1.35)
Glucose, Bld: 193 mg/dL — ABNORMAL HIGH (ref 70–99)
Potassium: 4.3 mEq/L (ref 3.5–5.3)
Sodium: 135 mEq/L (ref 135–145)
Total Bilirubin: 0.3 mg/dL (ref 0.2–1.2)
Total Protein: 6.2 g/dL (ref 6.0–8.3)

## 2014-09-14 ENCOUNTER — Other Ambulatory Visit: Payer: Self-pay | Admitting: *Deleted

## 2014-09-14 ENCOUNTER — Emergency Department (HOSPITAL_COMMUNITY): Payer: Medicare Other

## 2014-09-14 ENCOUNTER — Encounter (HOSPITAL_COMMUNITY): Payer: Self-pay | Admitting: Emergency Medicine

## 2014-09-14 ENCOUNTER — Ambulatory Visit: Payer: Medicare Other | Admitting: Cardiology

## 2014-09-14 ENCOUNTER — Emergency Department (HOSPITAL_COMMUNITY)
Admission: EM | Admit: 2014-09-14 | Discharge: 2014-09-14 | Disposition: A | Discharge status: Home / Self Care | Payer: Medicare Other | Attending: Emergency Medicine | Admitting: Emergency Medicine

## 2014-09-14 DIAGNOSIS — R0789 Other chest pain: Secondary | ICD-10-CM | POA: Diagnosis not present

## 2014-09-14 DIAGNOSIS — E1140 Type 2 diabetes mellitus with diabetic neuropathy, unspecified: Secondary | ICD-10-CM | POA: Insufficient documentation

## 2014-09-14 DIAGNOSIS — W19XXXA Unspecified fall, initial encounter: Secondary | ICD-10-CM | POA: Insufficient documentation

## 2014-09-14 DIAGNOSIS — R0602 Shortness of breath: Secondary | ICD-10-CM | POA: Diagnosis not present

## 2014-09-14 DIAGNOSIS — Z794 Long term (current) use of insulin: Secondary | ICD-10-CM | POA: Insufficient documentation

## 2014-09-14 DIAGNOSIS — Z7982 Long term (current) use of aspirin: Secondary | ICD-10-CM | POA: Insufficient documentation

## 2014-09-14 DIAGNOSIS — Z79899 Other long term (current) drug therapy: Secondary | ICD-10-CM | POA: Insufficient documentation

## 2014-09-14 DIAGNOSIS — Z043 Encounter for examination and observation following other accident: Secondary | ICD-10-CM | POA: Insufficient documentation

## 2014-09-14 DIAGNOSIS — E669 Obesity, unspecified: Secondary | ICD-10-CM | POA: Diagnosis not present

## 2014-09-14 DIAGNOSIS — Z8669 Personal history of other diseases of the nervous system and sense organs: Secondary | ICD-10-CM | POA: Insufficient documentation

## 2014-09-14 DIAGNOSIS — I2510 Atherosclerotic heart disease of native coronary artery without angina pectoris: Secondary | ICD-10-CM | POA: Insufficient documentation

## 2014-09-14 DIAGNOSIS — R0600 Dyspnea, unspecified: Secondary | ICD-10-CM | POA: Diagnosis not present

## 2014-09-14 DIAGNOSIS — Y9389 Activity, other specified: Secondary | ICD-10-CM | POA: Insufficient documentation

## 2014-09-14 DIAGNOSIS — I10 Essential (primary) hypertension: Secondary | ICD-10-CM | POA: Diagnosis not present

## 2014-09-14 DIAGNOSIS — R079 Chest pain, unspecified: Secondary | ICD-10-CM | POA: Diagnosis not present

## 2014-09-14 DIAGNOSIS — K219 Gastro-esophageal reflux disease without esophagitis: Secondary | ICD-10-CM | POA: Insufficient documentation

## 2014-09-14 DIAGNOSIS — Y998 Other external cause status: Secondary | ICD-10-CM | POA: Insufficient documentation

## 2014-09-14 DIAGNOSIS — Z87448 Personal history of other diseases of urinary system: Secondary | ICD-10-CM | POA: Insufficient documentation

## 2014-09-14 DIAGNOSIS — Z9889 Other specified postprocedural states: Secondary | ICD-10-CM | POA: Insufficient documentation

## 2014-09-14 DIAGNOSIS — Z8673 Personal history of transient ischemic attack (TIA), and cerebral infarction without residual deficits: Secondary | ICD-10-CM | POA: Insufficient documentation

## 2014-09-14 DIAGNOSIS — Y9289 Other specified places as the place of occurrence of the external cause: Secondary | ICD-10-CM | POA: Insufficient documentation

## 2014-09-14 DIAGNOSIS — Z87891 Personal history of nicotine dependence: Secondary | ICD-10-CM | POA: Insufficient documentation

## 2014-09-14 LAB — CBC
HCT: 43.3 % (ref 39.0–52.0)
Hemoglobin: 15.1 g/dL (ref 13.0–17.0)
MCH: 30.5 pg (ref 26.0–34.0)
MCHC: 34.9 g/dL (ref 30.0–36.0)
MCV: 87.5 fL (ref 78.0–100.0)
Platelets: 208 10*3/uL (ref 150–400)
RBC: 4.95 MIL/uL (ref 4.22–5.81)
RDW: 13.4 % (ref 11.5–15.5)
WBC: 8 10*3/uL (ref 4.0–10.5)

## 2014-09-14 LAB — BASIC METABOLIC PANEL
Anion gap: 8 (ref 5–15)
BUN: 11 mg/dL (ref 6–23)
CO2: 29 mmol/L (ref 19–32)
Calcium: 8.7 mg/dL (ref 8.4–10.5)
Chloride: 97 mEq/L (ref 96–112)
Creatinine, Ser: 0.76 mg/dL (ref 0.50–1.35)
GFR calc Af Amer: >90 mL/min (ref >90)
GFR calc non Af Amer: >90 mL/min (ref >90)
Glucose, Bld: 185 mg/dL — ABNORMAL HIGH (ref 70–99)
Potassium: 3.8 mmol/L (ref 3.5–5.1)
Sodium: 134 mmol/L — ABNORMAL LOW (ref 135–145)

## 2014-09-14 LAB — TROPONIN I
Troponin I: <0.03 ng/mL (ref <0.031)
Troponin I: <0.03 ng/mL (ref <0.031)

## 2014-09-14 LAB — BRAIN NATRIURETIC PEPTIDE: B Natriuretic Peptide: 95.8 pg/mL (ref 0.0–100.0)

## 2014-09-14 LAB — I-STAT TROPONIN, ED: Troponin i, poc: 0.01 ng/mL (ref 0.00–0.08)

## 2014-09-14 MED ORDER — MORPHINE SULFATE 4 MG/ML IJ SOLN
6.0000 mg | Freq: Once | INTRAMUSCULAR | Status: AC
  Administered 2014-09-14: 6 mg via INTRAVENOUS
  Filled 2014-09-14: qty 2

## 2014-09-14 MED ORDER — ONDANSETRON HCL 4 MG/2ML IJ SOLN
4.0000 mg | Freq: Once | INTRAMUSCULAR | Status: AC
Start: 2014-09-14 — End: 2014-09-14
  Administered 2014-09-14: 4 mg via INTRAVENOUS
  Filled 2014-09-14: qty 2

## 2014-09-14 MED ORDER — FENTANYL CITRATE 0.05 MG/ML IJ SOLN
75.0000 ug | Freq: Once | INTRAMUSCULAR | Status: AC
  Administered 2014-09-14: 75 ug via INTRAVENOUS
  Filled 2014-09-14: qty 2

## 2014-09-14 MED ORDER — HYDROCODONE-ACETAMINOPHEN 5-325 MG PO TABS: ORAL_TABLET | ORAL | Status: DC

## 2014-09-14 MED ORDER — IOHEXOL 350 MG/ML SOLN
100.0000 mL | Freq: Once | INTRAVENOUS | Status: AC | PRN
  Administered 2014-09-14: 100 mL via INTRAVENOUS

## 2014-09-14 NOTE — Discharge Instructions (Signed)
Chest Pain (Nonspecific) °It is often hard to give a specific diagnosis for the cause of chest pain. There is always a chance that your pain could be related to something serious, such as a heart attack or a blood clot in the lungs. You need to follow up with your health care provider for further evaluation. °CAUSES  °· Heartburn. °· Pneumonia or bronchitis. °· Anxiety or stress. °· Inflammation around your heart (pericarditis) or lung (pleuritis or pleurisy). °· A blood clot in the lung. °· A collapsed lung (pneumothorax). It can develop suddenly on its own (spontaneous pneumothorax) or from trauma to the chest. °· Shingles infection (herpes zoster virus). °The chest wall is composed of bones, muscles, and cartilage. Any of these can be the source of the pain. °· The bones can be bruised by injury. °· The muscles or cartilage can be strained by coughing or overwork. °· The cartilage can be affected by inflammation and become sore (costochondritis). °DIAGNOSIS  °Lab tests or other studies may be needed to find the cause of your pain. Your health care provider may have you take a test called an ambulatory electrocardiogram (ECG). An ECG records your heartbeat patterns over a 24-hour period. You may also have other tests, such as: °· Transthoracic echocardiogram (TTE). During echocardiography, sound waves are used to evaluate how blood flows through your heart. °· Transesophageal echocardiogram (TEE). °· Cardiac monitoring. This allows your health care provider to monitor your heart rate and rhythm in real time. °· Holter monitor. This is a portable device that records your heartbeat and can help diagnose heart arrhythmias. It allows your health care provider to track your heart activity for several days, if needed. °· Stress tests by exercise or by giving medicine that makes the heart beat faster. °TREATMENT  °· Treatment depends on what may be causing your chest pain. Treatment may include: °· Acid blockers for  heartburn. °· Anti-inflammatory medicine. °· Pain medicine for inflammatory conditions. °· Antibiotics if an infection is present. °· You may be advised to change lifestyle habits. This includes stopping smoking and avoiding alcohol, caffeine, and chocolate. °· You may be advised to keep your head raised (elevated) when sleeping. This reduces the chance of acid going backward from your stomach into your esophagus. °Most of the time, nonspecific chest pain will improve within 2-3 days with rest and mild pain medicine.  °HOME CARE INSTRUCTIONS  °· If antibiotics were prescribed, take them as directed. Finish them even if you start to feel better. °· For the next few days, avoid physical activities that bring on chest pain. Continue physical activities as directed. °· Do not use any tobacco products, including cigarettes, chewing tobacco, or electronic cigarettes. °· Avoid drinking alcohol. °· Only take medicine as directed by your health care provider. °· Follow your health care provider's suggestions for further testing if your chest pain does not go away. °· Keep any follow-up appointments you made. If you do not go to an appointment, you could develop lasting (chronic) problems with pain. If there is any problem keeping an appointment, call to reschedule. °SEEK MEDICAL CARE IF:  °· Your chest pain does not go away, even after treatment. °· You have a rash with blisters on your chest. °· You have a fever. °SEEK IMMEDIATE MEDICAL CARE IF:  °· You have increased chest pain or pain that spreads to your arm, neck, jaw, back, or abdomen. °· You have shortness of breath. °· You have an increasing cough, or you cough   up blood. °· You have severe back or abdominal pain. °· You feel nauseous or vomit. °· You have severe weakness. °· You faint. °· You have chills. °This is an emergency. Do not wait to see if the pain will go away. Get medical help at once. Call your local emergency services (911 in [place] Do not drive  yourself to the hospital. °MAKE SURE YOU:  °· Understand these instructions. °· Will watch your condition. °· Will get help right away if you are not doing well or get worse. °Document Released: 06/06/2005 Document Revised: 09/01/2013 Document Reviewed: 04/01/2008 °ExitCare® Patient Information ©2015 [employer]. This information is not intended to replace advice given to you by your health care provider. Make sure you discuss any questions you have with your health care provider. °Shortness of Breath °Shortness of breath means you have trouble breathing. It could also mean that you have a medical problem. You should get immediate medical care for shortness of breath. °CAUSES  °· Not enough oxygen in the air such as with high altitudes or a smoke-filled room. °· Certain lung diseases, infections, or problems. °· Heart disease or conditions, such as angina or heart failure. °· Low red blood cells (anemia). °· Poor physical fitness, which can cause shortness of breath when you exercise. °· Chest or back injuries or stiffness. °· Being overweight. °· Smoking. °· Anxiety, which can make you feel like you are not getting enough air. °DIAGNOSIS  °Serious medical problems can often be found during your physical exam. Tests may also be done to determine why you are having shortness of breath. Tests may include: °· Chest X-rays. °· Lung function tests. °· Blood tests. °· An electrocardiogram (ECG). °· An ambulatory electrocardiogram. An ambulatory ECG records your heartbeat patterns over a 24-hour period. °· Exercise testing. °· A transthoracic echocardiogram (TTE). During echocardiography, sound waves are used to evaluate how blood flows through your heart. °· A transesophageal echocardiogram (TEE). °· Imaging scans. °Your health care provider may not be able to find a cause for your shortness of breath after your exam. In this case, it is important to have a follow-up exam with your health care provider as directed.    °TREATMENT  °Treatment for shortness of breath depends on the cause of your symptoms and can vary greatly. °HOME CARE INSTRUCTIONS  °· Do not smoke. Smoking is a common cause of shortness of breath. If you smoke, ask for help to quit. °· Avoid being around chemicals or things that may bother your breathing, such as paint fumes and dust. °· Rest as needed. Slowly resume your usual activities. °· If medicines were prescribed, take them as directed for the full length of time directed. This includes oxygen and any inhaled medicines. °· Keep all follow-up appointments as directed by your health care provider. °SEEK MEDICAL CARE IF:  °· Your condition does not improve in the time expected. °· You have a hard time doing your normal activities even with rest. °· You have any new symptoms. °SEEK IMMEDIATE MEDICAL CARE IF:  °· Your shortness of breath gets worse. °· You feel light-headed, faint, or develop a cough not controlled with medicines. °· You start coughing up blood. °· You have pain with breathing. °· You have chest pain or pain in your arms, shoulders, or abdomen. °· You have a fever. °· You are unable to walk up stairs or exercise the way you normally do. °MAKE SURE YOU: °· Understand these instructions. °· Will watch your condition. °·   Will get help right away if you are not doing well or get worse. °Document Released: 05/22/2001 Document Revised: 09/01/2013 Document Reviewed: 11/12/2011 °ExitCare® Patient Information ©2015 [employer]. This information is not intended to replace advice given to you by your health care provider. Make sure you discuss any questions you have with your health care provider. ° °

## 2014-09-14 NOTE — Telephone Encounter (Signed)
[facility] 

## 2014-09-14 NOTE — ED Notes (Signed)
Pt given sandwich and beverage per request

## 2014-09-14 NOTE — ED Notes (Signed)
Pt. arrived with EMS from [place] home woke up this morning with central chest tightness/heaviness , mild SOB  and nausea , pt. received 3 NTG sl and ASA 324 mg prior to arrival with no relief.

## 2014-09-14 NOTE — ED Provider Notes (Signed)
CSN: [id number]     Arrival date & time 09/14/14  [id number] History   First MD Initiated Contact with Patient 09/14/14 [phone]     Chief Complaint  Patient presents with  . Chest Pain     (Consider location/radiation/quality/duration/timing/severity/associated sxs/prior Treatment) HPI   69 year old male with shortness of breath and chest discomfort. Symptom onset around 0500 stay. Onset while laying in bed. Symptoms did wake him from sleep. Sensation that he cannot catch his breath. Associated with some pressure in his chest going to his neck/face and left upper extremity. Patient initially felt that he may be sleeping wrong in bed. He removed his pillow and with repositioning he felt a little bit better. Hx of coronary artery disease s/p LHC in 2013 90% stenosis of RCA treated medically.Nuclear study May 2015 showed an ejection fraction of 44%. There was inferior scar with mild peri-infarct ischemia. Study low risk. Echocardiogram October 2015 showed normal LV function.   Past Medical History  Diagnosis Date  . Obese   . Hypertension   . Diabetes mellitus   . Diabetic diarrhea   . Sleep apnea   . Diabetic neuropathy, painful   . Chronic indwelling Foley catheter   . Bladder spasms   . Complication of anesthesia   . Hyperlipidemia   . GERD (gastroesophageal reflux disease)   . Neurogenic bladder   . Stroke 2013  . CAD (coronary artery disease)    Past Surgical History  Procedure Laterality Date  . Right tkr    . Gastroplasty    . Cholecystectomy    . Total knee arthroplasty    . Left heart catheterization with coronary angiogram N/A 10/24/2011    Procedure: LEFT HEART CATHETERIZATION WITH CORONARY ANGIOGRAM;  Surgeon: Candee Furbish, MD;  Location: [facility] CATH LAB;  Service: Cardiovascular;  Laterality: N/A;  right radial artery approach   Family History  Problem Relation Age of Onset  . Hypertension Mother   . Diabetes type I Father   . Cancer Brother      Testicular Cancer  . Cancer  Brother     Leukemia   History  Substance Use Topics  . Smoking status: Former Smoker -- 2.00 packs/day for 20 years    Quit date: 09/10/1976  . Smokeless tobacco: Never Used  . Alcohol Use: No     Comment: FORMER ALCOHOLIC WITH 23 YEAR SOBRIETY    Review of Systems  All systems reviewed and negative, other than as noted in HPI.   Allergies  Ace inhibitors; Lipitor; Metformin and related; Cefadroxil; Cephalexin; Morphine and related; and Robaxin  Home Medications   Prior to Admission medications   Medication Sig Start Date End Date Taking? Authorizing Provider  aspirin EC 81 MG tablet Take 81 mg by mouth every morning.     Historical Provider, MD  diphenhydrAMINE (SOMINEX) 25 MG tablet Take 25 mg by mouth at bedtime as needed for sleep.    Historical Provider, MD  diphenoxylate-atropine (LOMOTIL) 2.5-0.025 MG per tablet Take 1 tablet by mouth 3 (three) times daily as needed. For diarrhea 09/06/14   Tiffany L Reed, DO  fexofenadine (ALLEGRA) 180 MG tablet Take 180 mg by mouth at bedtime as needed for allergies.     Historical Provider, MD  furosemide (LASIX) 40 MG tablet Take 1 tablet (40 mg total) by mouth daily. 04/03/14   Irven Shelling, MD  HUMULIN 70/30 (70-30) 100 UNIT/ML injection Inject 40 Units into the skin 2 (two) times daily. 08/09/14   Historical Provider,  MD  HYDROcodone-acetaminophen (NORCO/VICODIN) 5-325 MG per tablet Take 1-2 tablets by mouth every 4 (four) hours as needed for moderate pain. 09/06/14   Tiffany L Reed, DO  isosorbide mononitrate (IMDUR) 30 MG 24 hr tablet Take 1 tablet (30 mg total) by mouth daily. 08/31/14   Costin Karlyne Greenspan, MD  metoprolol tartrate (LOPRESSOR) 25 MG tablet Take 1 tablet (25 mg total) by mouth 2 (two) times daily. 04/03/14   Irven Shelling, MD  nitroGLYCERIN (NITROSTAT) 0.4 MG SL tablet Place 0.4 mg under the tongue every 5 (five) minutes as needed for chest pain.    Historical Provider, MD  omeprazole (PRILOSEC) 20 MG  capsule Take 20 mg by mouth daily.    Historical Provider, MD  oxybutynin (DITROPAN) 5 MG tablet Take 5 mg by mouth 3 (three) times daily as needed for bladder spasms.    Historical Provider, MD  Sennosides 25 MG TABS Take 25 mg by mouth daily as needed (for constipation).    Historical Provider, MD  sulfamethoxazole-trimethoprim (BACTRIM DS,SEPTRA DS) 800-160 MG per tablet Take 1 tablet by mouth every 12 (twelve) hours. Patient not taking: Reported on 09/07/2014 08/31/14   Caren Griffins, MD   BP 129/58 mmHg  Pulse 72  Temp(Src) 97.3 F (36.3 C) (Oral)  Resp 10  SpO2 96% Physical Exam  Constitutional: He appears well-developed and well-nourished. No distress.  HENT:  Head: Normocephalic and atraumatic.  Eyes: Conjunctivae are normal. Right eye exhibits no discharge. Left eye exhibits no discharge.  Neck: Neck supple.  Cardiovascular: Normal rate, regular rhythm and normal heart sounds.  Exam reveals no gallop and no friction rub.   No murmur heard. Pulmonary/Chest: Effort normal and breath sounds normal. No respiratory distress.  Abdominal: Soft. He exhibits no distension. There is no tenderness.  Musculoskeletal: He exhibits no edema or tenderness.  Lower extremities symmetric as compared to each other. No calf tenderness. Negative Homan's. No palpable cords.   Neurological: He is alert.  Skin: Skin is warm and dry.  Psychiatric: He has a normal mood and affect. His behavior is normal. Thought content normal.  Nursing note and vitals reviewed.   ED Course  Procedures (including critical care time) Labs Review Labs Reviewed  BASIC METABOLIC PANEL - Abnormal; Notable for the following:    Sodium 134 (*)    Glucose, Bld 185 (*)    All other components within normal limits  CBC  BRAIN NATRIURETIC PEPTIDE  TROPONIN I  TROPONIN I  I-STAT TROPOININ, ED    Imaging Review No results found.  Dg Chest 2 View  09/14/2014   CLINICAL DATA:  Chest pain starting today.  EXAM:  CHEST  2 VIEW  COMPARISON:  August 27, 2014.  FINDINGS: The heart size and mediastinal contours are within normal limits. Both lungs are clear. No pneumothorax or pleural effusion is noted. The visualized skeletal structures are unremarkable.  IMPRESSION: No acute cardiopulmonary abnormality seen.   Electronically Signed   By: Sabino Dick M.D.   On: 09/14/2014 08:20   Ct Angio Chest Pe W/cm &/or Wo Cm  09/14/2014   CLINICAL DATA:  Upper chest pressure and heaviness, multiple medical problems, recent urinary tract infection with confusion  EXAM: CT ANGIOGRAPHY CHEST WITH CONTRAST  TECHNIQUE: Multidetector CT imaging of the chest was performed using the standard protocol during bolus administration of intravenous contrast. Multiplanar CT image reconstructions and MIPs were obtained to evaluate the vascular anatomy.  CONTRAST:  16mL OMNIPAQUE IOHEXOL 350 MG/ML SOLN  COMPARISON:  Chest x-ray of 09/14/2014 and CT chest of 11/12/2011  FINDINGS: The pulmonary arteries opacify well with no evidence of acute pulmonary embolism. The thoracic aorta is not as well opacified but no acute abnormality is seen. As described previously Coronary artery calcification is present primarily in the distribution of the left anterior descending artery and cardiomegaly is stable. No pericardial effusion is noted. Surgical clips are noted in the left upper quadrant from prior gastric bypass surgery with changes of cholecystectomy also noted no mediastinal or hilar adenopathy is seen.  On lung window images, no focal infiltrate or pleural effusion is seen. No lung nodule is noted. The central airway is patent. There is some motion obscuring detail at the lung bases. No acute bony abnormality is seen.  Review of the MIP images confirms the above findings.  IMPRESSION: 1. No evidence of acute pulmonary embolism. 2. No infiltrate or effusion. 3. Coronary artery calcifications, stable cardiomegaly.   Electronically Signed   By: Ivar Drape  M.D.   On: 09/14/2014 14:56   EKG Interpretation   Date/Time:  Tuesday September 14 2014 06:41:32 EST Ventricular Rate:  70 PR Interval:  250 QRS Duration: 125 QT Interval:  425 QTC Calculation: 459 R Axis:   -53 Text Interpretation:  Sinus rhythm Prolonged PR interval Left bundle  branch block No significant change since last tracing 30 Aug 2014  Confirmed by KNAPP  MD-I, IVA ([id number]) on 09/14/2014 6:55:31 AM      MDM   Final diagnoses:  Dyspnea  Fall  Chest pain, unspecified chest pain type    68yM with CP. Normal troponin x3. CTa neg for PE, dissection or other explanatory pathology. Doubt emergent process. Close outpt FU.     Virgel Manifold, MD 09/18/14 [phone]

## 2014-09-14 NOTE — ED Notes (Signed)
Pt resting quietly and O2 sats 88% on RA after medication administration. Placed on 1 L via nasal cannula. O2 sats > 90%

## 2014-09-14 NOTE — ED Notes (Signed)
Patient transported to X-ray 

## 2014-09-20 ENCOUNTER — Other Ambulatory Visit: Payer: Self-pay | Admitting: Internal Medicine

## 2014-09-20 ENCOUNTER — Non-Acute Institutional Stay (SKILLED_NURSING_FACILITY): Payer: Medicare Other | Admitting: Adult Health

## 2014-09-20 DIAGNOSIS — I2510 Atherosclerotic heart disease of native coronary artery without angina pectoris: Secondary | ICD-10-CM

## 2014-09-20 LAB — CBC WITH DIFFERENTIAL/PLATELET
Basophils Absolute: 0.1 10*3/uL (ref 0.0–0.1)
Basophils Relative: 1 % (ref 0–1)
Eosinophils Absolute: 0.2 10*3/uL (ref 0.0–0.7)
Eosinophils Relative: 3 % (ref 0–5)
HCT: 41 % (ref 39.0–52.0)
Hemoglobin: 14 g/dL (ref 13.0–17.0)
Lymphocytes Relative: 18 % (ref 12–46)
Lymphs Abs: 1.4 10*3/uL (ref 0.7–4.0)
MCH: 30 pg (ref 26.0–34.0)
MCHC: 34.1 g/dL (ref 30.0–36.0)
MCV: 87.8 fL (ref 78.0–100.0)
MPV: 9.8 fL (ref 8.6–12.4)
Monocytes Absolute: 0.6 10*3/uL (ref 0.1–1.0)
Monocytes Relative: 7 % (ref 3–12)
Neutro Abs: 5.6 10*3/uL (ref 1.7–7.7)
Neutrophils Relative %: 71 % (ref 43–77)
Platelets: 197 10*3/uL (ref 150–400)
RBC: 4.67 MIL/uL (ref 4.22–5.81)
RDW: 14.1 % (ref 11.5–15.5)
WBC: 7.9 10*3/uL (ref 4.0–10.5)

## 2014-09-20 LAB — COMPREHENSIVE METABOLIC PANEL
ALT: 11 U/L (ref 0–53)
AST: 12 U/L (ref 0–37)
Albumin: 3.2 g/dL — ABNORMAL LOW (ref 3.5–5.2)
Alkaline Phosphatase: 74 U/L (ref 39–117)
BUN: 11 mg/dL (ref 6–23)
CO2: 25 mEq/L (ref 19–32)
Calcium: 8.5 mg/dL (ref 8.4–10.5)
Chloride: 98 mEq/L (ref 96–112)
Creat: 0.81 mg/dL (ref 0.50–1.35)
Glucose, Bld: 284 mg/dL — ABNORMAL HIGH (ref 70–99)
Potassium: 4.4 mEq/L (ref 3.5–5.3)
Sodium: 134 mEq/L — ABNORMAL LOW (ref 135–145)
Total Bilirubin: 0.4 mg/dL (ref 0.2–1.2)
Total Protein: 6 g/dL (ref 6.0–8.3)

## 2014-09-21 ENCOUNTER — Encounter: Payer: Self-pay | Admitting: Cardiology

## 2014-09-23 ENCOUNTER — Non-Acute Institutional Stay (SKILLED_NURSING_FACILITY): Payer: Medicare Other | Admitting: Adult Health

## 2014-09-23 DIAGNOSIS — I10 Essential (primary) hypertension: Secondary | ICD-10-CM | POA: Diagnosis not present

## 2014-09-23 DIAGNOSIS — I5032 Chronic diastolic (congestive) heart failure: Secondary | ICD-10-CM | POA: Diagnosis not present

## 2014-09-23 DIAGNOSIS — R5381 Other malaise: Secondary | ICD-10-CM

## 2014-09-23 DIAGNOSIS — R072 Precordial pain: Secondary | ICD-10-CM | POA: Diagnosis not present

## 2014-10-13 DIAGNOSIS — L89312 Pressure ulcer of right buttock, stage 2: Secondary | ICD-10-CM | POA: Diagnosis not present

## 2014-10-13 DIAGNOSIS — E1142 Type 2 diabetes mellitus with diabetic polyneuropathy: Secondary | ICD-10-CM | POA: Diagnosis not present

## 2014-10-13 DIAGNOSIS — L89612 Pressure ulcer of right heel, stage 2: Secondary | ICD-10-CM | POA: Diagnosis not present

## 2014-10-13 DIAGNOSIS — I5032 Chronic diastolic (congestive) heart failure: Secondary | ICD-10-CM | POA: Diagnosis not present

## 2014-10-15 DIAGNOSIS — L89612 Pressure ulcer of right heel, stage 2: Secondary | ICD-10-CM | POA: Diagnosis not present

## 2014-10-15 DIAGNOSIS — L89312 Pressure ulcer of right buttock, stage 2: Secondary | ICD-10-CM | POA: Diagnosis not present

## 2014-10-18 DIAGNOSIS — L89312 Pressure ulcer of right buttock, stage 2: Secondary | ICD-10-CM | POA: Diagnosis not present

## 2014-10-18 DIAGNOSIS — L89612 Pressure ulcer of right heel, stage 2: Secondary | ICD-10-CM | POA: Diagnosis not present

## 2014-10-19 ENCOUNTER — Encounter: Payer: Self-pay | Admitting: Adult Health

## 2014-10-19 NOTE — Progress Notes (Signed)
Patient ID: [name], male   DOB: 19-Dec-1945, 69 y.o.   MRN: [id number]  [name] living [place]     Allergies  Allergen Reactions  . Ace Inhibitors Swelling  . Lipitor [Atorvastatin Calcium] Swelling  . Metformin And Related Swelling  . Cefadroxil Hives  . Cephalexin Hives  . Morphine And Related Other (See Comments)    Sweating, feels like is "in rocky boat."  . Robaxin [Methocarbamol] Other (See Comments)    Feels like he is shaky       Chief Complaint  Patient presents with  . Acute Visit    chest pain     HPI:  He is complaining of chest pressure when he lowers the head of his bed down, he states the pain radiates to his left arm. He denies any shortness of breath present; denies any nausea. He odes not want to go to the er at this time. He is not diaphoretic and is not in ditsress.   Past Medical History  Diagnosis Date  . Obese   . Hypertension   . Diabetes mellitus   . Diabetic diarrhea   . Sleep apnea   . Diabetic neuropathy, painful   . Chronic indwelling Foley catheter   . Bladder spasms   . Complication of anesthesia   . Hyperlipidemia   . GERD (gastroesophageal reflux disease)   . Neurogenic bladder   . Stroke 2013  . CAD (coronary artery disease)     Past Surgical History  Procedure Laterality Date  . Right tkr    . Gastroplasty    . Cholecystectomy    . Total knee arthroplasty    . Left heart catheterization with coronary angiogram N/A 10/24/2011    Procedure: LEFT HEART CATHETERIZATION WITH CORONARY ANGIOGRAM;  Surgeon: Candee Furbish, MD;  Location: [facility] CATH LAB;  Service: Cardiovascular;  Laterality: N/A;  right radial artery approach    VITAL SIGNS BP 148/60 mmHg  Pulse 84  Ht 6\' 1"  (1.854 m)  Wt 346 lb (156.945 kg)  BMI 45.66 kg/m2  SpO2 98%   Outpatient Encounter Prescriptions as of 09/20/2014  Medication Sig  . aspirin EC 81 MG tablet Take 81 mg by mouth every morning.   . diphenhydrAMINE (SOMINEX) 25 MG tablet Take 25 mg  by mouth at bedtime as needed for sleep.  . diphenoxylate-atropine (LOMOTIL) 2.5-0.025 MG per tablet Take 1 tablet by mouth 3 (three) times daily as needed. For diarrhea  . fexofenadine (ALLEGRA) 180 MG tablet Take 180 mg by mouth at bedtime as needed for allergies.   . furosemide (LASIX) 40 MG tablet Take 1 tablet (40 mg total) by mouth daily.  Marland Kitchen HYDROcodone-acetaminophen (NORCO/VICODIN) 5-325 MG per tablet Take one to two tablets by mouth every 4 hours as needed for pain. Not to exceed 3000mg  of APAP  . insulin aspart protamine- aspart (NOVOLOG MIX 70/30) (70-30) 100 UNIT/ML injection Inject 40 Units into the skin 2 (two) times daily with a meal.  . isosorbide mononitrate (IMDUR) 30 MG 24 hr tablet Take 1 tablet (30 mg total) by mouth daily.  . metoprolol tartrate (LOPRESSOR) 25 MG tablet Take 1 tablet (25 mg total) by mouth 2 (two) times daily.  . nitroGLYCERIN (NITROSTAT) 0.4 MG SL tablet Place 0.4 mg under the tongue every 5 (five) minutes as needed for chest pain.  Marland Kitchen omeprazole (PRILOSEC) 20 MG capsule Take 20 mg by mouth daily.  Marland Kitchen oxybutynin (DITROPAN) 5 MG tablet Take 5 mg by mouth 3 (three) times daily  as needed for bladder spasms.  . Sennosides 25 MG TABS Take 25 mg by mouth daily as needed (for constipation).  Marland Kitchen sulfamethoxazole-trimethoprim (BACTRIM DS,SEPTRA DS) 800-160 MG per tablet Take 1 tablet by mouth every 12 (twelve) hours. (Patient not taking: Reported on 09/07/2014)     SIGNIFICANT DIAGNOSTIC EXAMS   08-27-14: kub Moderate stool burden.  No obstructive changes are seen  08-27-14: chest x-ray: Mild CHF.    LABS REVIEWED:   06-25-14: hgb a1c 8.9 08-27-14: wbc 8.5; hgb 15.0; hct 44.1 ;mcv 87.2; plt 215; glucose 209; bun 11; creat 0.64 ;k+4.2; na++135 liver normal albumin 3.5; ammonia 15; tsh 2.95; BNP 1121.0; urine culture: negative 08-31-14: wbc 6.3; hgb 12.5; hct 38.0; mcv 87.6; plt 188; glucose 223; bun 8; creat 0.77; k+4.2; na++135; liver normal albumin  2.9 09-06-14: wbc 7.5; hgb 14.0; hct 41.0; mcv 86.1; plt 215; glucose 196; bun 15; creat 0.78; k+4.4; na++134        Review of Systems  Constitutional: Negative for malaise/fatigue.  Respiratory: Negative for cough and shortness of breath.   Cardiovascular: Positive for chest pain. Negative for palpitations and leg swelling.       Has chest "pressure" when his hob is down radiates to left shoulder  Gastrointestinal: Negative for heartburn and abdominal pain.  Musculoskeletal: Negative for myalgias and joint pain.  Skin: Negative.   Neurological: Negative for headaches.  Psychiatric/Behavioral: The patient is not nervous/anxious.      Physical Exam Constitutional: He is oriented to person, place, and time. No distress.  Morbid obese   Neck: Neck supple. No JVD present. No thyromegaly present.  Cardiovascular: Normal rate, regular rhythm and intact distal pulses.   Respiratory: Effort normal and breath sounds normal. No respiratory distress.  GI: Soft. Bowel sounds are normal. He exhibits no distension. There is no tenderness.  Musculoskeletal:  Is able to move all extremities; no edema   Neurological: He is alert and oriented to person, place, and time.  Skin: Skin is warm and dry. He is not diaphoretic.  Lower extremities discolored  Lower extremities with scabbed areas        ASSESSMENT/ PLAN:  1. Chest pain: will get a stats chest x-ray; will an ekg; cbc; cmp; and will increase hi imdur to 60 mg daily and will continue to monitor his status; and will treat as indicated.    Time spent with patient 40 minutes.   Ok Edwards NP [facility]  Contact [phone] Monday through Friday 8am- 5pm  After hours call [phone]

## 2014-10-19 NOTE — Progress Notes (Signed)
Patient ID: [name], male   DOB: [birth year], 69 y.o.   MRN: [id number]  [name] living [place]     Allergies  Allergen Reactions  . Ace Inhibitors Swelling  . Lipitor [Atorvastatin Calcium] Swelling  . Metformin And Related Swelling  . Cefadroxil Hives  . Cephalexin Hives  . Morphine And Related Other (See Comments)    Sweating, feels like is "in rocky boat."  . Robaxin [Methocarbamol] Other (See Comments)    Feels like he is shaky       Chief Complaint  Patient presents with  . Discharge Note    HPI:  He is being discharged to home with home health for pt/ot/rn to improve upon gait strength; and independence with adls; heart program and medication management. He will need his prescriptions to be written and will need a follow up with his pcp. His chest resolved and has not returned since starting the increased dose of imdur.  He had been hospitalized for cellulitis in his lower extremity and was admitted to this facility for short term rehab.   Past Medical History  Diagnosis Date  . Obese   . Hypertension   . Diabetes mellitus   . Diabetic diarrhea   . Sleep apnea   . Diabetic neuropathy, painful   . Chronic indwelling Foley catheter   . Bladder spasms   . Complication of anesthesia   . Hyperlipidemia   . GERD (gastroesophageal reflux disease)   . Neurogenic bladder   . Stroke 2013  . CAD (coronary artery disease)     Past Surgical History  Procedure Laterality Date  . Right tkr    . Gastroplasty    . Cholecystectomy    . Total knee arthroplasty    . Left heart catheterization with coronary angiogram N/A 10/24/2011    Procedure: LEFT HEART CATHETERIZATION WITH CORONARY ANGIOGRAM;  Surgeon: Candee Furbish, MD;  Location: [facility] CATH LAB;  Service: Cardiovascular;  Laterality: N/A;  right radial artery approach    VITAL SIGNS BP 126/63 mmHg  Pulse 73  Ht 6\' 1"  (1.854 m)  Wt 351 lb (159.213 kg)  BMI 46.32 kg/m2  SpO2 98%   Outpatient Encounter  Prescriptions as of 09/23/2014  Medication Sig  . aspirin EC 81 MG tablet Take 81 mg by mouth every morning.   . diphenhydrAMINE (SOMINEX) 25 MG tablet Take 25 mg by mouth at bedtime as needed for sleep.  . diphenoxylate-atropine (LOMOTIL) 2.5-0.025 MG per tablet Take 1 tablet by mouth 3 (three) times daily as needed. For diarrhea  . fexofenadine (ALLEGRA) 180 MG tablet Take 180 mg by mouth at bedtime as needed for allergies.   . furosemide (LASIX) 40 MG tablet Take 1 tablet (40 mg total) by mouth daily.  Marland Kitchen HYDROcodone-acetaminophen (NORCO/VICODIN) 5-325 MG per tablet Take one to two tablets by mouth every 4 hours as needed for pain. Not to exceed 3000mg  of APAP  . insulin aspart protamine- aspart (NOVOLOG MIX 70/30) (70-30) 100 UNIT/ML injection Inject 40 Units into the skin 2 (two) times daily with a meal.  . isosorbide mononitrate (IMDUR) 30 MG 24 hr tablet Take 2 tablet (60 mg total) by mouth daily.  . metoprolol tartrate (LOPRESSOR) 25 MG tablet Take 1 tablet (25 mg total) by mouth 2 (two) times daily.  . nitroGLYCERIN (NITROSTAT) 0.4 MG SL tablet Place 0.4 mg under the tongue every 5 (five) minutes as needed for chest pain.  Marland Kitchen omeprazole (PRILOSEC) 20 MG capsule Take 20 mg by mouth daily.  Marland Kitchen  oxybutynin (DITROPAN) 5 MG tablet Take 5 mg by mouth 3 (three) times daily as needed for bladder spasms.  . Sennosides 25 MG TABS Take 25 mg by mouth daily as needed (for constipation).  Marland Kitchen sulfamethoxazole-trimethoprim (BACTRIM DS,SEPTRA DS) 800-160 MG per tablet Take 1 tablet by mouth every 12 (twelve) hours.     SIGNIFICANT DIAGNOSTIC EXAMS   08-27-14: kub Moderate stool burden.  No obstructive changes are seen  08-27-14: chest x-ray: Mild CHF.  09-20-14: ekg: sinus rhythm with pvc's first degree avb  09-20-14: chest x-ray: no acute disease process     LABS REVIEWED:   06-25-14: hgb a1c 8.9 08-27-14: wbc 8.5; hgb 15.0; hct 44.1 ;mcv 87.2; plt 215; glucose 209; bun 11; creat 0.64 ;k+4.2;  na++135 liver normal albumin 3.5; ammonia 15; tsh 2.95; BNP 1121.0; urine culture: negative 08-31-14: wbc 6.3; hgb 12.5; hct 38.0; mcv 87.6; plt 188; glucose 223; bun 8; creat 0.77; k+4.2; na++135; liver normal albumin 2.9 09-06-14: wbc 7.5; hgb 14.0; hct 41.0; mcv 86.1; plt 215; glucose 196; bun 15; creat 0.78; k+4.4; na++134 09-20-13: wbc 7.9; hgb 14.0; hct 41.0; mcv 87.8; plt 197; glucose 284; bun 11; creat 0.81; k+4.4 ;na++134; liver normal albumin 3.2      ROS Constitutional: Negative for malaise/fatigue.  Respiratory: Negative for cough and shortness of breath.   Cardiovascular: negative for chest pain  Negative for palpitations and leg swelling.   Gastrointestinal: Negative for heartburn and abdominal pain.  Musculoskeletal: Negative for myalgias and joint pain.  Skin: Negative.   Neurological: Negative for headaches.  Psychiatric/Behavioral: The patient is not nervous/anxious   Physical Exam Constitutional: He is oriented to person, place, and time. No distress.  Morbid obese   Neck: Neck supple. No JVD present. No thyromegaly present.  Cardiovascular: Normal rate, regular rhythm and intact distal pulses.   Respiratory: Effort normal and breath sounds normal. No respiratory distress.  GI: Soft. Bowel sounds are normal. He exhibits no distension. There is no tenderness.  Musculoskeletal:  Is able to move all extremities; no edema   Neurological: He is alert and oriented to person, place, and time.  Skin: Skin is warm and dry. He is not diaphoretic.  Lower extremities discolored  Lower extremities with scabbed areas      ASSESSMENT/ PLAN:   Will discharge to home with home health for pt/ot/rn to improve upon gait strength; heart program and mediation management. His prescriptions have been written for a 30 day supply of his medications with #30 lomotil and #30 vicodin 5/325 mg tabs. He will not need dme; he has a follow up appointment scheduled with [name] 10-07-14  at 4:15 pm   Time spent with patient 45 minutes.  Ok Edwards NP [facility]  Contact [phone] Monday through Friday 8am- 5pm  After hours call [phone]

## 2014-10-21 DIAGNOSIS — L89312 Pressure ulcer of right buttock, stage 2: Secondary | ICD-10-CM | POA: Diagnosis not present

## 2014-10-21 DIAGNOSIS — L89612 Pressure ulcer of right heel, stage 2: Secondary | ICD-10-CM | POA: Diagnosis not present

## 2014-10-24 DIAGNOSIS — L89612 Pressure ulcer of right heel, stage 2: Secondary | ICD-10-CM | POA: Diagnosis not present

## 2014-10-24 DIAGNOSIS — L89312 Pressure ulcer of right buttock, stage 2: Secondary | ICD-10-CM | POA: Diagnosis not present

## 2014-11-01 DIAGNOSIS — L89612 Pressure ulcer of right heel, stage 2: Secondary | ICD-10-CM | POA: Diagnosis not present

## 2014-11-01 DIAGNOSIS — L89312 Pressure ulcer of right buttock, stage 2: Secondary | ICD-10-CM | POA: Diagnosis not present

## 2014-11-02 DIAGNOSIS — L89612 Pressure ulcer of right heel, stage 2: Secondary | ICD-10-CM | POA: Diagnosis not present

## 2014-11-02 DIAGNOSIS — L89312 Pressure ulcer of right buttock, stage 2: Secondary | ICD-10-CM | POA: Diagnosis not present

## 2014-11-10 ENCOUNTER — Inpatient Hospital Stay (HOSPITAL_COMMUNITY)
Admission: EM | Admit: 2014-11-10 | Discharge: 2014-11-11 | DRG: 313 | Disposition: A | Discharge status: Home Health | Payer: Medicare Other | Attending: Internal Medicine | Admitting: Internal Medicine

## 2014-11-10 ENCOUNTER — Encounter (HOSPITAL_COMMUNITY): Payer: Self-pay | Admitting: Emergency Medicine

## 2014-11-10 ENCOUNTER — Other Ambulatory Visit: Payer: Self-pay

## 2014-11-10 ENCOUNTER — Emergency Department (HOSPITAL_COMMUNITY): Payer: Medicare Other

## 2014-11-10 ENCOUNTER — Other Ambulatory Visit (HOSPITAL_COMMUNITY): Payer: Self-pay

## 2014-11-10 DIAGNOSIS — Z885 Allergy status to narcotic agent status: Secondary | ICD-10-CM | POA: Diagnosis not present

## 2014-11-10 DIAGNOSIS — Z888 Allergy status to other drugs, medicaments and biological substances status: Secondary | ICD-10-CM

## 2014-11-10 DIAGNOSIS — N318 Other neuromuscular dysfunction of bladder: Secondary | ICD-10-CM | POA: Diagnosis present

## 2014-11-10 DIAGNOSIS — E1165 Type 2 diabetes mellitus with hyperglycemia: Secondary | ICD-10-CM | POA: Diagnosis not present

## 2014-11-10 DIAGNOSIS — Z960 Presence of urogenital implants: Secondary | ICD-10-CM

## 2014-11-10 DIAGNOSIS — R0602 Shortness of breath: Secondary | ICD-10-CM | POA: Diagnosis not present

## 2014-11-10 DIAGNOSIS — R0789 Other chest pain: Principal | ICD-10-CM | POA: Diagnosis present

## 2014-11-10 DIAGNOSIS — I2510 Atherosclerotic heart disease of native coronary artery without angina pectoris: Secondary | ICD-10-CM | POA: Diagnosis not present

## 2014-11-10 DIAGNOSIS — T8351XA Infection and inflammatory reaction due to indwelling urinary catheter, initial encounter: Secondary | ICD-10-CM | POA: Diagnosis present

## 2014-11-10 DIAGNOSIS — Z833 Family history of diabetes mellitus: Secondary | ICD-10-CM

## 2014-11-10 DIAGNOSIS — E1142 Type 2 diabetes mellitus with diabetic polyneuropathy: Secondary | ICD-10-CM | POA: Diagnosis not present

## 2014-11-10 DIAGNOSIS — Z86718 Personal history of other venous thrombosis and embolism: Secondary | ICD-10-CM | POA: Diagnosis not present

## 2014-11-10 DIAGNOSIS — N390 Urinary tract infection, site not specified: Secondary | ICD-10-CM | POA: Diagnosis present

## 2014-11-10 DIAGNOSIS — I10 Essential (primary) hypertension: Secondary | ICD-10-CM | POA: Diagnosis present

## 2014-11-10 DIAGNOSIS — Z6841 Body Mass Index (BMI) 40.0 and over, adult: Secondary | ICD-10-CM | POA: Diagnosis not present

## 2014-11-10 DIAGNOSIS — Z8249 Family history of ischemic heart disease and other diseases of the circulatory system: Secondary | ICD-10-CM

## 2014-11-10 DIAGNOSIS — Z79899 Other long term (current) drug therapy: Secondary | ICD-10-CM

## 2014-11-10 DIAGNOSIS — R079 Chest pain, unspecified: Secondary | ICD-10-CM | POA: Diagnosis present

## 2014-11-10 DIAGNOSIS — T83511A Infection and inflammatory reaction due to indwelling urethral catheter, initial encounter: Secondary | ICD-10-CM

## 2014-11-10 DIAGNOSIS — Z96651 Presence of right artificial knee joint: Secondary | ICD-10-CM | POA: Diagnosis present

## 2014-11-10 DIAGNOSIS — K219 Gastro-esophageal reflux disease without esophagitis: Secondary | ICD-10-CM | POA: Diagnosis present

## 2014-11-10 DIAGNOSIS — I5032 Chronic diastolic (congestive) heart failure: Secondary | ICD-10-CM | POA: Diagnosis present

## 2014-11-10 DIAGNOSIS — Z7982 Long term (current) use of aspirin: Secondary | ICD-10-CM | POA: Diagnosis not present

## 2014-11-10 DIAGNOSIS — E1140 Type 2 diabetes mellitus with diabetic neuropathy, unspecified: Secondary | ICD-10-CM | POA: Diagnosis not present

## 2014-11-10 DIAGNOSIS — G4733 Obstructive sleep apnea (adult) (pediatric): Secondary | ICD-10-CM | POA: Diagnosis not present

## 2014-11-10 DIAGNOSIS — L89612 Pressure ulcer of right heel, stage 2: Secondary | ICD-10-CM | POA: Diagnosis not present

## 2014-11-10 DIAGNOSIS — Z794 Long term (current) use of insulin: Secondary | ICD-10-CM | POA: Diagnosis not present

## 2014-11-10 DIAGNOSIS — E0800 Diabetes mellitus due to underlying condition with hyperosmolarity without nonketotic hyperglycemic-hyperosmolar coma (NKHHC): Secondary | ICD-10-CM | POA: Diagnosis present

## 2014-11-10 DIAGNOSIS — Z87891 Personal history of nicotine dependence: Secondary | ICD-10-CM | POA: Diagnosis not present

## 2014-11-10 DIAGNOSIS — R2230 Localized swelling, mass and lump, unspecified upper limb: Secondary | ICD-10-CM | POA: Diagnosis present

## 2014-11-10 DIAGNOSIS — R8290 Unspecified abnormal findings in urine: Secondary | ICD-10-CM | POA: Diagnosis present

## 2014-11-10 DIAGNOSIS — B9689 Other specified bacterial agents as the cause of diseases classified elsewhere: Secondary | ICD-10-CM | POA: Diagnosis not present

## 2014-11-10 DIAGNOSIS — L89312 Pressure ulcer of right buttock, stage 2: Secondary | ICD-10-CM | POA: Diagnosis not present

## 2014-11-10 DIAGNOSIS — Z978 Presence of other specified devices: Secondary | ICD-10-CM

## 2014-11-10 HISTORY — DX: Pneumonia, unspecified organism: J18.9

## 2014-11-10 HISTORY — DX: Type 2 diabetes mellitus without complications: E11.9

## 2014-11-10 HISTORY — DX: Other chronic pain: G89.29

## 2014-11-10 HISTORY — DX: Low back pain, unspecified: M54.50

## 2014-11-10 HISTORY — DX: Low back pain: M54.5

## 2014-11-10 HISTORY — DX: Heart failure, unspecified: I50.9

## 2014-11-10 HISTORY — DX: Unspecified osteoarthritis, unspecified site: M19.90

## 2014-11-10 HISTORY — DX: Acute embolism and thrombosis of unspecified deep veins of unspecified lower extremity: I82.409

## 2014-11-10 HISTORY — DX: Obstructive sleep apnea (adult) (pediatric): G47.33

## 2014-11-10 LAB — URINE MICROSCOPIC-ADD ON

## 2014-11-10 LAB — CBC
HCT: 40.2 % (ref 39.0–52.0)
Hemoglobin: 13.7 g/dL (ref 13.0–17.0)
MCH: 29.8 pg (ref 26.0–34.0)
MCHC: 34.1 g/dL (ref 30.0–36.0)
MCV: 87.4 fL (ref 78.0–100.0)
PLATELETS: 190 10*3/uL (ref 150–400)
RBC: 4.6 MIL/uL (ref 4.22–5.81)
RDW: 13.3 % (ref 11.5–15.5)
WBC: 7.6 10*3/uL (ref 4.0–10.5)

## 2014-11-10 LAB — URINALYSIS, ROUTINE W REFLEX MICROSCOPIC
BILIRUBIN URINE: NEGATIVE
GLUCOSE, UA: NEGATIVE mg/dL
Ketones, ur: NEGATIVE mg/dL
NITRITE: NEGATIVE
PH: 7 (ref 5.0–8.0)
Protein, ur: 30 mg/dL — AB
SPECIFIC GRAVITY, URINE: 1.004 — AB (ref 1.005–1.030)
Urobilinogen, UA: 0.2 mg/dL (ref 0.0–1.0)

## 2014-11-10 LAB — BASIC METABOLIC PANEL
Anion gap: 9 (ref 5–15)
BUN: <5 mg/dL — ABNORMAL LOW (ref 6–23)
CHLORIDE: 98 mmol/L (ref 96–112)
CO2: 27 mmol/L (ref 19–32)
Calcium: 8.5 mg/dL (ref 8.4–10.5)
Creatinine, Ser: 0.71 mg/dL (ref 0.50–1.35)
GFR calc Af Amer: >90 mL/min (ref >90)
GFR calc non Af Amer: >90 mL/min (ref >90)
Glucose, Bld: 255 mg/dL — ABNORMAL HIGH (ref 70–99)
Potassium: 3.6 mmol/L (ref 3.5–5.1)
SODIUM: 134 mmol/L — AB (ref 135–145)

## 2014-11-10 LAB — GLUCOSE, CAPILLARY
Glucose-Capillary: 232 mg/dL — ABNORMAL HIGH (ref 70–99)
Glucose-Capillary: 227 mg/dL — ABNORMAL HIGH (ref 70–99)

## 2014-11-10 LAB — TROPONIN I: Troponin I: <0.03 ng/mL (ref <0.031)

## 2014-11-10 LAB — I-STAT TROPONIN, ED: Troponin i, poc: 0.01 ng/mL (ref 0.00–0.08)

## 2014-11-10 MED ORDER — GI COCKTAIL ~~LOC~~
30.0000 mL | Freq: Four times a day (QID) | ORAL | Status: DC | PRN
  Administered 2014-11-10: 30 mL via ORAL
  Filled 2014-11-10 (×3): qty 30

## 2014-11-10 MED ORDER — CIPROFLOXACIN HCL 500 MG PO TABS
500.0000 mg | ORAL_TABLET | Freq: Two times a day (BID) | ORAL | Status: DC
  Administered 2014-11-11: 500 mg via ORAL
  Filled 2014-11-10 (×3): qty 1

## 2014-11-10 MED ORDER — METOPROLOL TARTRATE 25 MG PO TABS
25.0000 mg | ORAL_TABLET | Freq: Two times a day (BID) | ORAL | Status: DC
  Administered 2014-11-10 – 2014-11-11 (×2): 25 mg via ORAL
  Filled 2014-11-10 (×3): qty 1

## 2014-11-10 MED ORDER — CIPROFLOXACIN IN D5W 400 MG/200ML IV SOLN
400.0000 mg | Freq: Once | INTRAVENOUS | Status: AC
  Administered 2014-11-10: 400 mg via INTRAVENOUS
  Filled 2014-11-10: qty 200

## 2014-11-10 MED ORDER — ZOLPIDEM TARTRATE 5 MG PO TABS: 5.0000 mg | ORAL_TABLET | Freq: Every evening | ORAL | Status: DC | PRN

## 2014-11-10 MED ORDER — OXYBUTYNIN CHLORIDE 5 MG PO TABS
5.0000 mg | ORAL_TABLET | Freq: Three times a day (TID) | ORAL | Status: DC | PRN
  Filled 2014-11-10: qty 1

## 2014-11-10 MED ORDER — FUROSEMIDE 40 MG PO TABS
40.0000 mg | ORAL_TABLET | Freq: Every day | ORAL | Status: DC
  Administered 2014-11-11: 40 mg via ORAL
  Filled 2014-11-10: qty 1

## 2014-11-10 MED ORDER — NITROGLYCERIN 0.4 MG SL SUBL: 0.4000 mg | SUBLINGUAL_TABLET | SUBLINGUAL | Status: DC | PRN

## 2014-11-10 MED ORDER — NITROGLYCERIN 0.4 MG SL SUBL
0.4000 mg | SUBLINGUAL_TABLET | SUBLINGUAL | Status: AC | PRN
  Administered 2014-11-10 (×3): 0.4 mg via SUBLINGUAL
  Filled 2014-11-10 (×2): qty 1

## 2014-11-10 MED ORDER — KETOROLAC TROMETHAMINE 30 MG/ML IJ SOLN
30.0000 mg | Freq: Once | INTRAMUSCULAR | Status: AC
  Administered 2014-11-10: 30 mg via INTRAVENOUS
  Filled 2014-11-10: qty 1

## 2014-11-10 MED ORDER — ACETAMINOPHEN 325 MG PO TABS: 650.0000 mg | ORAL_TABLET | ORAL | Status: DC | PRN

## 2014-11-10 MED ORDER — INSULIN ASPART PROT & ASPART (70-30 MIX) 100 UNIT/ML ~~LOC~~ SUSP
50.0000 [IU] | Freq: Two times a day (BID) | SUBCUTANEOUS | Status: DC
  Filled 2014-11-10: qty 10

## 2014-11-10 MED ORDER — HEPARIN SODIUM (PORCINE) 5000 UNIT/ML IJ SOLN
5000.0000 [IU] | Freq: Three times a day (TID) | INTRAMUSCULAR | Status: DC
  Administered 2014-11-10 – 2014-11-11 (×2): 5000 [IU] via SUBCUTANEOUS
  Filled 2014-11-10 (×3): qty 1

## 2014-11-10 MED ORDER — DIPHENHYDRAMINE HCL (SLEEP) 25 MG PO TABS: 25.0000 mg | ORAL_TABLET | Freq: Every evening | ORAL | Status: DC | PRN

## 2014-11-10 MED ORDER — HYDROMORPHONE HCL 1 MG/ML IJ SOLN
1.0000 mg | Freq: Once | INTRAMUSCULAR | Status: AC
  Administered 2014-11-10: 1 mg via INTRAVENOUS
  Filled 2014-11-10: qty 1

## 2014-11-10 MED ORDER — INSULIN ASPART 100 UNIT/ML ~~LOC~~ SOLN
0.0000 [IU] | Freq: Every day | SUBCUTANEOUS | Status: DC
  Administered 2014-11-10: 2 [IU] via SUBCUTANEOUS

## 2014-11-10 MED ORDER — ASPIRIN 81 MG PO CHEW: 324.0000 mg | CHEWABLE_TABLET | Freq: Once | ORAL | Status: DC

## 2014-11-10 MED ORDER — ENSURE COMPLETE PO LIQD: 237.0000 mL | Freq: Two times a day (BID) | ORAL | Status: DC

## 2014-11-10 MED ORDER — PANTOPRAZOLE SODIUM 40 MG PO TBEC
40.0000 mg | DELAYED_RELEASE_TABLET | Freq: Every day | ORAL | Status: DC
  Administered 2014-11-11: 40 mg via ORAL
  Filled 2014-11-10: qty 1

## 2014-11-10 MED ORDER — INSULIN ASPART 100 UNIT/ML ~~LOC~~ SOLN
0.0000 [IU] | Freq: Three times a day (TID) | SUBCUTANEOUS | Status: DC
  Administered 2014-11-11: 3 [IU] via SUBCUTANEOUS

## 2014-11-10 MED ORDER — MORPHINE SULFATE 2 MG/ML IJ SOLN
2.0000 mg | INTRAMUSCULAR | Status: DC | PRN
  Filled 2014-11-10: qty 1

## 2014-11-10 MED ORDER — NITROGLYCERIN 2 % TD OINT
0.5000 [in_us] | TOPICAL_OINTMENT | Freq: Four times a day (QID) | TRANSDERMAL | Status: DC
  Administered 2014-11-10 – 2014-11-11 (×2): 0.5 [in_us] via TOPICAL
  Filled 2014-11-10 (×4): qty 30
  Filled 2014-11-10: qty 1

## 2014-11-10 MED ORDER — ONDANSETRON HCL 4 MG/2ML IJ SOLN: 4.0000 mg | Freq: Four times a day (QID) | INTRAMUSCULAR | Status: DC | PRN

## 2014-11-10 MED ORDER — LORATADINE 10 MG PO TABS
10.0000 mg | ORAL_TABLET | Freq: Every evening | ORAL | Status: DC | PRN
  Filled 2014-11-10: qty 1

## 2014-11-10 MED ORDER — ASPIRIN EC 325 MG PO TBEC
325.0000 mg | DELAYED_RELEASE_TABLET | Freq: Every day | ORAL | Status: DC
  Administered 2014-11-11: 325 mg via ORAL
  Filled 2014-11-10: qty 1

## 2014-11-10 NOTE — ED Notes (Signed)
Per [facility] pt called for groin pain and discomfort. He said he needed a cath change cause he didn't want his home health nurse to do it. Pt was  Told he would be taking to [facility] and he said that he began to have chest pain no rating of pain.  He described it as a pressure raidiating to left arm.  Was given 324 mg of ASA in route.  VS are as follows: BP:148/70 HR:64 O2 98% on 2L

## 2014-11-10 NOTE — H&P (Signed)
[facility] History and Physical                                                                                    [name], is a 69 y.o. male  MRN: [id number]   DOB - 01-24-1946  Admit Date - 11/10/2014  Outpatient Primary MD for the patient is Irven Shelling, MD  With History of -  Past Medical History  Diagnosis Date  . Obese   . Hypertension   . Diabetes mellitus   . Diabetic diarrhea   . Sleep apnea   . Diabetic neuropathy, painful   . Chronic indwelling Foley catheter   . Bladder spasms   . Complication of anesthesia   . Hyperlipidemia   . GERD (gastroesophageal reflux disease)   . Neurogenic bladder   . Stroke 2013  . CAD (coronary artery disease)       Past Surgical History  Procedure Laterality Date  . Right tkr    . Gastroplasty    . Cholecystectomy    . Total knee arthroplasty    . Left heart catheterization with coronary angiogram N/A 10/24/2011    Procedure: LEFT HEART CATHETERIZATION WITH CORONARY ANGIOGRAM;  Surgeon: Candee Furbish, MD;  Location: [facility] CATH LAB;  Service: Cardiovascular;  Laterality: N/A;  right radial artery approach    in for   Chief Complaint  Patient presents with  . Chest Pain  . Urinary Tract Infection     HPI [name]  is a 69 y.o. male, 69 year old morbidly obese male with multiple medical problems putting diabetes, hypertension, sleep apnea, peripheral neuropathy from diabetes, chronic indwelling Foley catheter from diabetes-related neurogenic bladder, dyslipidemia, GERD, CAD and prior stroke. He was recently discharged from a local rehabilitation facility on 2/9 after treatment for deconditioning related to left lower extremity cellulitis. He apparently wanted to present to one of the local hospitals to have his Foley catheter changed out; portably he preferred having this done in the ER as opposed to having a home health nurse change out the catheter due to issues occurred in the past. While en route to  [facility] patient began complaining of substernal chest pressure like someone standing on his chest so he was rerouted to [facility] ER.  Upon arrival to the ER patient's EKG was normal. He was given sublingual nitroglycerin which dropped his blood pressure 91/47. His initial chest pain was reported at 6-7/10 and had improved to 5/10. Because he continued to have chest pressure the ER doctor did give him a small dose of IV Dilaudid after his blood pressure improved. His initial troponin was 0.01. His heart score 7 which puts him at high risk for coronary disease plus he has known coronary disease. His sugars were elevated at 255. His urinalysis was abnormal and concerning for possibly urinary tract infection but note patient also has a chronic Foley catheter. This catheter was changed out in the emergency department upon arrival.  Upon my questioning of the patient he reported that he had anterior central and left-sided chest pressure that felt like someone was standing on his chest, this did radiate to his left arm. Pain scale as  above. He was not expressing any pain with deep breathing or when the chest wall was palpated. He endorses nausea with the pain which has now resolved but he did not have any diaphoresis. Other review of systems include patient reports he has not been eating and drinking lately and he notes his urine has been darker. He denies any new swelling in his legs. He was complaining of a lump in his right axilla.  Review of Systems   In addition to the HPI above,  No Fever-chills, myalgias or other constitutional symptoms No Headache, changes with Vision or hearing, new weakness, tingling, numbness in any extremity, No problems swallowing food or Liquids, indigestion/reflux No Cough or Shortness of Breath, palpitations, orthopnea or DOE No Abdominal pain, N/V; no melena or hematochezia, no dark tarry stools, Bowel movements are regular, No new skin rashes, lesions, masses  or bruises, No new joints pains-aches No recent weight gain or loss No polyuria, polydypsia or polyphagia,  *A full 10 point Review of Systems was done, except as stated above, all other Review of Systems were negative.  Social History History  Substance Use Topics  . Smoking status: Former Smoker -- 2.00 packs/day for 20 years    Quit date: 09/10/1976  . Smokeless tobacco: Never Used  . Alcohol Use: No     Comment: FORMER ALCOHOLIC WITH 23 YEAR SOBRIETY    Family History Family History  Problem Relation Age of Onset  . Hypertension Mother   . Diabetes type I Father   . Cancer Brother      Testicular Cancer  . Cancer Brother     Leukemia    Prior to Admission medications   Medication Sig Start Date End Date Taking? Authorizing Provider  aspirin EC 81 MG tablet Take 81 mg by mouth every morning.    Yes Historical Provider, MD  diphenhydrAMINE (SOMINEX) 25 MG tablet Take 25 mg by mouth at bedtime as needed for sleep.   Yes Historical Provider, MD  diphenoxylate-atropine (LOMOTIL) 2.5-0.025 MG per tablet Take 1 tablet by mouth 3 (three) times daily as needed. For diarrhea 09/06/14  Yes Tiffany L Reed, DO  fexofenadine (ALLEGRA) 180 MG tablet Take 180 mg by mouth at bedtime as needed for allergies.    Yes Historical Provider, MD  furosemide (LASIX) 40 MG tablet Take 1 tablet (40 mg total) by mouth daily. 04/03/14  Yes Irven Shelling, MD  HUMULIN 70/30 (70-30) 100 UNIT/ML injection Inject 50 Units into the skin 2 (two) times daily. 10/15/14  Yes Historical Provider, MD  HYDROcodone-acetaminophen (NORCO/VICODIN) 5-325 MG per tablet Take one to two tablets by mouth every 4 hours as needed for pain. Not to exceed 3000mg  of APAP 09/14/14  Yes Mahima Pandey, MD  lisinopril (PRINIVIL,ZESTRIL) 5 MG tablet Take 5 mg by mouth daily. 09/28/14  Yes Historical Provider, MD  metoprolol tartrate (LOPRESSOR) 25 MG tablet Take 1 tablet (25 mg total) by mouth 2 (two) times daily. 04/03/14  Yes Irven Shelling, MD  nitroGLYCERIN (NITROSTAT) 0.4 MG SL tablet Place 0.4 mg under the tongue every 5 (five) minutes as needed for chest pain.   Yes Historical Provider, MD  omeprazole (PRILOSEC) 20 MG capsule Take 20 mg by mouth daily.   Yes Historical Provider, MD  oxybutynin (DITROPAN) 5 MG tablet Take 5 mg by mouth 3 (three) times daily as needed for bladder spasms.   Yes Historical Provider, MD  Sennosides 25 MG TABS Take 25 mg by mouth daily as needed (  for constipation).   Yes Historical Provider, MD  isosorbide mononitrate (IMDUR) 30 MG 24 hr tablet Take 1 tablet (30 mg total) by mouth daily. 08/31/14   Costin Karlyne Greenspan, MD  sulfamethoxazole-trimethoprim (BACTRIM DS,SEPTRA DS) 800-160 MG per tablet Take 1 tablet by mouth every 12 (twelve) hours. 08/31/14   Costin Karlyne Greenspan, MD    Allergies  Allergen Reactions  . Ace Inhibitors Swelling    Pt tolerates lisinopril  . Lipitor [Atorvastatin Calcium] Swelling  . Metformin And Related Swelling  . Cefadroxil Hives  . Cephalexin Hives  . Morphine And Related Other (See Comments)    Sweating, feels like is "in rocky boat."  . Robaxin [Methocarbamol] Other (See Comments)    Feels like he is shaky    Physical Exam  Vitals  Blood pressure 135/70, pulse 59, temperature 98.1 F (36.7 C), temperature source Oral, resp. rate 13, SpO2 97 %.   General:  In no acute distress although he is reporting that his chest pain has not completely resolved, appears healthy and well nourished  Psych:  Normal affect, Denies Suicidal or Homicidal ideations, Awake Alert, Oriented X 3. Speech and thought patterns are clear and appropriate, no apparent short term memory deficits  Neuro:   No focal neurological deficits, CN II through XII intact, Strength 5/5 all 4 extremities, Sensation intact all 4 extremities.  ENT:  Ears and Eyes appear Normal, Conjunctivae clear, PER. Moist oral mucosa without erythema or exudates.  Neck:  Supple, No lymphadenopathy  appreciated  Respiratory:  Symmetrical chest wall movement, Good air movement bilaterally, CTAB. Room Air, chest wall nontender to palpation  Cardiac:  RRR, No Murmurs, chronic brawny bilateral LE edema noted, no JVD, No carotid bruits, peripheral pulses palpable at 2+  Abdomen:  Obese, Positive bowel sounds, Soft, Non tender, Non distended,  No masses appreciated, no obvious hepatosplenomegaly  Genitourinary: Foley catheter in place with cloudy urine  Skin:  No Cyanosis, Normal Skin Turgor, No Skin Rash or Bruise. Has brawny skin changes consistent with chronic edema and stasis dermatitis  Extremities: Symmetrical without obvious trauma or injury,  no effusions.  Data Review  CBC  Recent Labs Lab 11/10/14 1302  WBC 7.6  HGB 13.7  HCT 40.2  PLT 190  MCV 87.4  MCH 29.8  MCHC 34.1  RDW 13.3    Chemistries   Recent Labs Lab 11/10/14 1302  NA 134*  K 3.6  CL 98  CO2 27  GLUCOSE 255*  BUN <5*  CREATININE 0.71  CALCIUM 8.5    CrCl cannot be calculated (Unknown ideal weight.).  No results for input(s): TSH, T4TOTAL, T3FREE, THYROIDAB in the last 72 hours.  Invalid input(s): FREET3  Coagulation profile No results for input(s): INR, PROTIME in the last 168 hours.  No results for input(s): DDIMER in the last 72 hours.  Cardiac Enzymes No results for input(s): CKMB, TROPONINI, MYOGLOBIN in the last 168 hours.  Invalid input(s): CK  Invalid input(s): POCBNP  Urinalysis    Component Value Date/Time   COLORURINE STRAW* 11/10/2014 1334   APPEARANCEUR CLOUDY* 11/10/2014 1334   LABSPEC 1.004* 11/10/2014 1334   PHURINE 7.0 11/10/2014 1334   GLUCOSEU NEGATIVE 11/10/2014 1334   HGBUR LARGE* 11/10/2014 1334   BILIRUBINUR NEGATIVE 11/10/2014 1334   KETONESUR NEGATIVE 11/10/2014 1334   PROTEINUR 30* 11/10/2014 1334   UROBILINOGEN 0.2 11/10/2014 1334   NITRITE NEGATIVE 11/10/2014 1334   LEUKOCYTESUR LARGE* 11/10/2014 1334    Imaging results:   Dg Chest 2  View  11/10/2014   CLINICAL DATA:  Shortness of breath and chest pressure on the left today.  EXAM: CHEST  2 VIEW  COMPARISON:  Single view of the chest 08/27/2014. CT chest 09/14/2014.  FINDINGS: There is cardiomegaly without edema. The lungs are clear. No pneumothorax or pleural effusion.  IMPRESSION: Cardiomegaly without acute disease.   Electronically Signed   By: Inge Rise M.D.   On: 11/10/2014 15:22     EKG: Sinus rhythm, QTC 486 ms, left bundle branch block   Assessment & Plan  Active Problems:   CAD (known RCA disease)/Chest pain -Admit to telemetry unit -Previously documented as medical therapy -Cycle cardiac enzymes -Apply Nitropaste -Increased to full dose aspirin -Heart score equals 7    Chronic indwelling Foley catheter/Abnormal urinalysis -Foley catheter changed in ER -Urinalysis abnormal but this could be baseline given chronic Foley -Follow-up on urine culture -Has no fever and no leukocytosis and given allergies to cephalosporins and borderline QTC will hold off on empiric antibiotics at this time    DM with complications -Current CBGs poorly controlled -Continue 70/30 insulin -Add sliding scale insulin -Globin A1c was 8.9 in October; repeat this admission    Hypertension -Initial borderline hypotension after sublingual nitroglycerin has resolved -Continue home medications    Axillary nodule -On exam this does not have an appearance insistent with folliculitis; actually feels like a tiny mobile lymph node under the surface of the skin -Continue to monitor in the event this is an early evolving abscess    OSA (obstructive sleep apnea) -Hour sleep CPAP if patient utilizes    Obesity- BMI 49    Chronic diastolic CHF (congestive heart failure) -Appears compensated at this point    DVT Prophylaxis: Subcutaneous heparin  Family Communication:   No family at bedside  Code Status:  full code  Condition:  Stable  Time spent in minutes :  60   ELLIS,ALLISON L. ANP on 11/10/2014 at 4:17 PM  Between 7am to 7pm - Pager - [phone]  After 7pm go to [email or web address] - password [email or web address]  And look for the night coverage person covering me after hours  [facility]

## 2014-11-10 NOTE — Progress Notes (Signed)
Pt is refusing to wear CPAP tonight. Pt understands that he can call if he changes his mind. RT will monitor.

## 2014-11-10 NOTE — ED Provider Notes (Signed)
CSN: [id number]     Arrival date & time 11/10/14  1249 History   First MD Initiated Contact with Patient 11/10/14 1258     Chief Complaint  Patient presents with  . Chest Pain  . Urinary Tract Infection     (Consider location/radiation/quality/duration/timing/severity/associated sxs/prior Treatment) Patient is a 69 y.o. male presenting with chest pain, urinary tract infection, and abdominal pain. The history is provided by the patient.  Chest Pain Pain location:  Substernal area Pain quality: pressure   Pain radiates to:  Does not radiate Pain radiates to the back: no   Pain severity:  Moderate Onset quality:  Sudden Timing:  Constant Progression:  Unchanged Chronicity:  New Context: at rest   Relieved by:  Nothing Worsened by:  Nothing tried Associated symptoms: abdominal pain   Associated symptoms: no cough, no fever and no shortness of breath   Urinary Tract Infection Associated symptoms include chest pain and abdominal pain. Pertinent negatives include no shortness of breath.  Abdominal Pain Pain location:  Suprapubic Pain quality: aching   Pain radiates to:  Does not radiate Pain severity:  Moderate Onset quality:  Gradual Timing:  Constant Progression:  Unchanged Chronicity:  New Relieved by:  Nothing Worsened by:  Nothing tried Associated symptoms: chest pain   Associated symptoms: no cough, no fever and no shortness of breath     Past Medical History  Diagnosis Date  . Obese   . Hypertension   . Diabetes mellitus   . Diabetic diarrhea   . Sleep apnea   . Diabetic neuropathy, painful   . Chronic indwelling Foley catheter   . Bladder spasms   . Complication of anesthesia   . Hyperlipidemia   . GERD (gastroesophageal reflux disease)   . Neurogenic bladder   . Stroke 2013  . CAD (coronary artery disease)    Past Surgical History  Procedure Laterality Date  . Right tkr    . Gastroplasty    . Cholecystectomy    . Total knee arthroplasty    . Left  heart catheterization with coronary angiogram N/A 10/24/2011    Procedure: LEFT HEART CATHETERIZATION WITH CORONARY ANGIOGRAM;  Surgeon: Candee Furbish, MD;  Location: [facility] CATH LAB;  Service: Cardiovascular;  Laterality: N/A;  right radial artery approach   Family History  Problem Relation Age of Onset  . Hypertension Mother   . Diabetes type I Father   . Cancer Brother      Testicular Cancer  . Cancer Brother     Leukemia   History  Substance Use Topics  . Smoking status: Former Smoker -- 2.00 packs/day for 20 years    Quit date: 09/10/1976  . Smokeless tobacco: Never Used  . Alcohol Use: No     Comment: FORMER ALCOHOLIC WITH 23 YEAR SOBRIETY    Review of Systems  Constitutional: Negative for fever.  Respiratory: Negative for cough and shortness of breath.   Cardiovascular: Positive for chest pain.  Gastrointestinal: Positive for abdominal pain.  All other systems reviewed and are negative.     Allergies  Ace inhibitors; Lipitor; Metformin and related; Cefadroxil; Cephalexin; Morphine and related; and Robaxin  Home Medications   Prior to Admission medications   Medication Sig Start Date End Date Taking? Authorizing Provider  aspirin EC 81 MG tablet Take 81 mg by mouth every morning.    Yes Historical Provider, MD  diphenhydrAMINE (SOMINEX) 25 MG tablet Take 25 mg by mouth at bedtime as needed for sleep.   Yes Historical  Provider, MD  diphenoxylate-atropine (LOMOTIL) 2.5-0.025 MG per tablet Take 1 tablet by mouth 3 (three) times daily as needed. For diarrhea 09/06/14  Yes Tiffany L Reed, DO  fexofenadine (ALLEGRA) 180 MG tablet Take 180 mg by mouth at bedtime as needed for allergies.    Yes Historical Provider, MD  furosemide (LASIX) 40 MG tablet Take 1 tablet (40 mg total) by mouth daily. 04/03/14  Yes Irven Shelling, MD  HUMULIN 70/30 (70-30) 100 UNIT/ML injection Inject 50 Units into the skin 2 (two) times daily. 10/15/14  Yes Historical Provider, MD   HYDROcodone-acetaminophen (NORCO/VICODIN) 5-325 MG per tablet Take one to two tablets by mouth every 4 hours as needed for pain. Not to exceed 3000mg  of APAP 09/14/14  Yes Mahima Pandey, MD  lisinopril (PRINIVIL,ZESTRIL) 5 MG tablet Take 5 mg by mouth daily. 09/28/14  Yes Historical Provider, MD  metoprolol tartrate (LOPRESSOR) 25 MG tablet Take 1 tablet (25 mg total) by mouth 2 (two) times daily. 04/03/14  Yes Irven Shelling, MD  nitroGLYCERIN (NITROSTAT) 0.4 MG SL tablet Place 0.4 mg under the tongue every 5 (five) minutes as needed for chest pain.   Yes Historical Provider, MD  omeprazole (PRILOSEC) 20 MG capsule Take 20 mg by mouth daily.   Yes Historical Provider, MD  oxybutynin (DITROPAN) 5 MG tablet Take 5 mg by mouth 3 (three) times daily as needed for bladder spasms.   Yes Historical Provider, MD  Sennosides 25 MG TABS Take 25 mg by mouth daily as needed (for constipation).   Yes Historical Provider, MD  isosorbide mononitrate (IMDUR) 30 MG 24 hr tablet Take 1 tablet (30 mg total) by mouth daily. 08/31/14   Costin Karlyne Greenspan, MD  sulfamethoxazole-trimethoprim (BACTRIM DS,SEPTRA DS) 800-160 MG per tablet Take 1 tablet by mouth every 12 (twelve) hours. 08/31/14   Costin Karlyne Greenspan, MD   BP 135/70 mmHg  Pulse 59  Temp(Src) 98.1 F (36.7 C) (Oral)  Resp 13  SpO2 97% Physical Exam  Constitutional: He is oriented to person, place, and time. He appears well-developed and well-nourished. No distress.  HENT:  Head: Normocephalic and atraumatic.  Mouth/Throat: Oropharynx is clear and moist. No oropharyngeal exudate.  Eyes: EOM are normal. Pupils are equal, round, and reactive to light.  Neck: Normal range of motion. Neck supple.  Cardiovascular: Normal rate and regular rhythm.  Exam reveals no friction rub.   No murmur heard. Pulmonary/Chest: Effort normal and breath sounds normal. No respiratory distress. He has no wheezes. He has no rales.  Abdominal: Soft. He exhibits no distension.  There is tenderness (suprapubic). There is no rebound.  Musculoskeletal: Normal range of motion. He exhibits no edema.  Neurological: He is alert and oriented to person, place, and time. No cranial nerve deficit. He exhibits normal muscle tone. Coordination normal.  Skin: No rash noted. He is not diaphoretic.  Nursing note and vitals reviewed.   ED Course  Procedures (including critical care time) Labs Review Labs Reviewed  BASIC METABOLIC PANEL - Abnormal; Notable for the following:    Sodium 134 (*)    Glucose, Bld 255 (*)    BUN <5 (*)    All other components within normal limits  URINALYSIS, ROUTINE W REFLEX MICROSCOPIC - Abnormal; Notable for the following:    Color, Urine STRAW (*)    APPearance CLOUDY (*)    Specific Gravity, Urine 1.004 (*)    Hgb urine dipstick LARGE (*)    Protein, ur 30 (*)  Leukocytes, UA LARGE (*)    All other components within normal limits  URINE CULTURE  CBC  URINE MICROSCOPIC-ADD ON  HEMOGLOBIN A1C  TROPONIN I  TROPONIN I  TROPONIN I  CBC  CREATININE, SERUM  I-STAT TROPOININ, ED    Imaging Review Dg Chest 2 View  11/10/2014   CLINICAL DATA:  Shortness of breath and chest pressure on the left today.  EXAM: CHEST  2 VIEW  COMPARISON:  Single view of the chest 08/27/2014. CT chest 09/14/2014.  FINDINGS: There is cardiomegaly without edema. The lungs are clear. No pneumothorax or pleural effusion.  IMPRESSION: Cardiomegaly without acute disease.   Electronically Signed   By: Inge Rise M.D.   On: 11/10/2014 15:22     EKG Interpretation None      Date: 11/10/2014  Rate: 63  Rhythm: normal sinus rhythm  QRS Axis: left  Intervals: normal  ST/T Wave abnormalities: nonspecific ST/T changes  Conduction Disutrbances:left bundle branch block  Narrative Interpretation:   Old EKG Reviewed: unchanged    MDM   Final diagnoses:  Chest pain, unspecified chest pain type  UTI (urinary tract infection) due to urinary indwelling catheter     68 rolled male here with multiple complaints. Chest pain: Began in route to the hospital. Left-sided pressure radiating to left arm. Rated 6 out of 10. After nitroglycerin only a 5 out of 10. We'll give narcotics. EKG similar prior troponin normal. Plan for admission for ACS rule out. Catheter problem: Has indwelling Foley for a long time, over 3 years for incomplete bladder emptying. Having pain and would not let his home health nurse change the catheter. The Foley placed here, UTI noted. Culture sent. Cipro initiated.  Evelina Bucy, MD 11/10/14 [id number]

## 2014-11-10 NOTE — ED Notes (Signed)
Pt reported lightheadedness after nitro administration- BP 91/47. EDP made aware.

## 2014-11-10 NOTE — ED Notes (Signed)
Pt also reporting diabetic sore on right heel.

## 2014-11-11 DIAGNOSIS — T8351XA Infection and inflammatory reaction due to indwelling urinary catheter, initial encounter: Secondary | ICD-10-CM | POA: Diagnosis not present

## 2014-11-11 DIAGNOSIS — R079 Chest pain, unspecified: Secondary | ICD-10-CM | POA: Diagnosis not present

## 2014-11-11 DIAGNOSIS — R0789 Other chest pain: Principal | ICD-10-CM

## 2014-11-11 DIAGNOSIS — I5032 Chronic diastolic (congestive) heart failure: Secondary | ICD-10-CM | POA: Diagnosis not present

## 2014-11-11 DIAGNOSIS — Z6841 Body Mass Index (BMI) 40.0 and over, adult: Secondary | ICD-10-CM | POA: Diagnosis not present

## 2014-11-11 DIAGNOSIS — I2510 Atherosclerotic heart disease of native coronary artery without angina pectoris: Secondary | ICD-10-CM | POA: Diagnosis not present

## 2014-11-11 DIAGNOSIS — I6789 Other cerebrovascular disease: Secondary | ICD-10-CM | POA: Diagnosis not present

## 2014-11-11 DIAGNOSIS — N390 Urinary tract infection, site not specified: Secondary | ICD-10-CM | POA: Diagnosis not present

## 2014-11-11 LAB — GLUCOSE, CAPILLARY
Glucose-Capillary: 211 mg/dL — ABNORMAL HIGH (ref 70–99)
Glucose-Capillary: 239 mg/dL — ABNORMAL HIGH (ref 70–99)

## 2014-11-11 LAB — TROPONIN I: Troponin I: <0.03 ng/mL (ref <0.031)

## 2014-11-11 LAB — URINE CULTURE: Colony Count: >=100000

## 2014-11-11 LAB — HEMOGLOBIN A1C
Hgb A1c MFr Bld: 9.1 % — ABNORMAL HIGH (ref 4.8–5.6)
MEAN PLASMA GLUCOSE: 214 mg/dL

## 2014-11-11 LAB — MRSA PCR SCREENING: MRSA by PCR: POSITIVE — AB

## 2014-11-11 MED ORDER — CHLORHEXIDINE GLUCONATE CLOTH 2 % EX PADS: 6.0000 | MEDICATED_PAD | Freq: Every day | CUTANEOUS | Status: DC

## 2014-11-11 MED ORDER — MUPIROCIN 2 % EX OINT
1.0000 "application " | TOPICAL_OINTMENT | Freq: Two times a day (BID) | CUTANEOUS | Status: DC
  Administered 2014-11-11 (×2): 1 via NASAL
  Filled 2014-11-11: qty 22

## 2014-11-11 NOTE — Discharge Instructions (Signed)
Follow with Primary MD Brian Shelling, MD in 7 days   Get CBC, CMP, 2 view Chest X ray checked  by Primary MD next visit.    Activity: As tolerated with Full fall precautions use walker/cane & assistance as needed   Disposition Home    Diet: Heart Healthy  , with feeding assistance and aspiration precautions as needed.  For Heart failure patients - Check your Weight same time everyday, if you gain over 2 pounds, or you develop in leg swelling, experience more shortness of breath or chest pain, call your Primary MD immediately. Follow Cardiac Low Salt Diet and 1.5 lit/day fluid restriction.   On your next visit with your primary care physician please Get Medicines reviewed and adjusted.   Please request your Prim.MD to go over all Hospital Tests and Procedure/Radiological results at the follow up, please get all Hospital records sent to your Prim MD by signing hospital release before you go home.   If you experience worsening of your admission symptoms, develop shortness of breath, life threatening emergency, suicidal or homicidal thoughts you must seek medical attention immediately by calling 911 or calling your MD immediately  if symptoms less severe.  You Must read complete instructions/literature along with all the possible adverse reactions/side effects for all the Medicines you take and that have been prescribed to you. Take any new Medicines after you have completely understood and accpet all the possible adverse reactions/side effects.   Do not drive, operating heavy machinery, perform activities at heights, swimming or participation in water activities or provide baby sitting services if your were admitted for syncope or siezures until you have seen by Primary MD or a Neurologist and advised to do so again.  Do not drive when taking Pain medications.    Do not take more than prescribed Pain, Sleep and Anxiety Medications  Special Instructions: If you have smoked or  chewed Tobacco  in the last 2 yrs please stop smoking, stop any regular Alcohol  and or any Recreational drug use.  Wear Seat belts while driving.   Please note  You were cared for by a hospitalist during your hospital stay. If you have any questions about your discharge medications or the care you received while you were in the hospital after you are discharged, you can call the unit and asked to speak with the hospitalist on call if the hospitalist that took care of you is not available. Once you are discharged, your primary care physician will handle any further medical issues. Please note that NO REFILLS for any discharge medications will be authorized once you are discharged, as it is imperative that you return to your primary care physician (or establish a relationship with a primary care physician if you do not have one) for your aftercare needs so that they can reassess your need for medications and monitor your lab values.

## 2014-11-11 NOTE — Progress Notes (Signed)
BSW Intern spoke with pt regarding discharge transportation. Pt stated that he would need nonemergency EMS transportation back to his house. Pt confirmed that he has his key for entry to home. Med Necessity list placed on chart for nurse. Nurse informed to call for transport when pt discharge is ready. Nurse agreed to call [facility] for transportation. SW signing off.  [address] [name], [place], [phone]

## 2014-11-11 NOTE — Discharge Summary (Signed)
[name], 69 y.o., DOB [birth year], MRN [id number]. Admission date: 11/10/2014 Discharge Date 11/11/2014 Primary MD Brian Shelling, MD Admitting Physician Brian Cota, MD   PCP please follow on: - Check CBC, BMP during next visit. - Please follow on urine culture done during hospital stay, as patient urinalysis was positive, patient is asymptomatic, no fever, no leukocytosis, and these findings are most likely related to chronic indwelling Foley catheter.  Admission Diagnosis  UTI (urinary tract infection) due to urinary indwelling catheter [T83.51XA, N39.0] Chest pain, unspecified chest pain type [R07.9]  Discharge Diagnosis   Principal Problem:   Chest pain Active Problems:   DM (diabetes mellitus) with complications   OSA (obstructive sleep apnea)   Hypertension   CAD- known RCA disease- medical Rx   Chronic indwelling Foley catheter   Obesity- BMI 49   Chronic diastolic CHF (congestive heart failure)   Abnormal urinalysis   Axillary nodule   Past Medical History  Diagnosis Date  . Obese   . Hypertension   . Diabetic neuropathy, painful   . Chronic indwelling Foley catheter   . Bladder spasms   . Hyperlipidemia   . GERD (gastroesophageal reflux disease)   . Neurogenic bladder   . CAD (coronary artery disease)   . PONV (postoperative nausea and vomiting)   . CHF (congestive heart failure)   . DVT (deep venous thrombosis) 1980's    LLE  . Type II diabetes mellitus   . Diabetic diarrhea   . Pneumonia 1999  . OSA (obstructive sleep apnea)     "they wanted me to wear a mask; I couldn't" (11/10/2014)  . Arthritis     "hands, ankles, knees" (11/10/2014)  . Chronic lower back pain   . Stroke 10/2011; 06/2014    right hand numbness/notes 10/20/2011, pt not sure he had stroke in 2013; "right hand weaker and right face not quite right since" (11/10/2014)    Past Surgical History  Procedure Laterality Date  . Gastroplasty    . Total knee arthroplasty Right 1990's   . Left heart catheterization with coronary angiogram N/A 10/24/2011    Procedure: LEFT HEART CATHETERIZATION WITH CORONARY ANGIOGRAM;  Surgeon: Brian Furbish, MD;  Location: [facility] CATH LAB;  Service: Cardiovascular;  Laterality: N/A;  right radial artery approach  . Cholecystectomy open  1970's?  . Joint replacement    . Carpal tunnel release Bilateral ~ 2003-2004     Hospital Course See H&P, Labs, Consult and Test reports for all details in brief, patient was admitted for **  Principal Problem:   Chest pain Active Problems:   DM (diabetes mellitus) with complications   OSA (obstructive sleep apnea)   Hypertension   CAD- known RCA disease- medical Rx   Chronic indwelling Foley catheter   Obesity- BMI 49   Chronic diastolic CHF (congestive heart failure)   Abnormal urinalysis   Axillary nodule  [name] is a 69 y.o. male, 69 year old morbidly obese male with multiple medical problems putting diabetes, hypertension, sleep apnea, peripheral neuropathy from diabetes, chronic indwelling Foley catheter from diabetes-related neurogenic bladder, dyslipidemia, GERD, CAD and prior stroke. He was recently discharged from a local rehabilitation facility on 2/9 after treatment for deconditioning related to left lower extremity cellulitis. He apparently wanted to present to one of the local hospitals to have his Foley catheter changed out; portably he preferred having this done in the ER as opposed to having a home health nurse change out the catheter due to issues occurred in the past.  While en route to [facility] patient began complaining of substernal chest pressure like someone standing on his chest so he was rerouted to [facility] ER.  Upon arrival to the ER patient's EKG was normal. He was given sublingual nitroglycerin which dropped his blood pressure 91/47. His initial chest pain was reported at 6-7/10 and had improved to 5/10. Because he continued to have chest pressure the ER doctor  did give him a small dose of IV Dilaudid after his blood pressure improved. His initial troponin was 0.01.  His urinalysis was abnormal and concerning for possibly urinary tract infection but note patient also has a chronic Foley catheter. This catheter was changed out in the emergency department upon arrival.  Chest pain - Has nontypical quality, relieved by GI cocktail, as well as reproducible by palpation. - Cardiac enzymes negative 3, EKG non acute, low risk nuclear stress test 5/15. - We will resume Imdur in discharge, follow with cardiology as an outpatient. Continue with aspirin, lisinopril, beta blockers   Chronic indwelling Foley catheter/Abnormal urinalysis -Foley catheter changed in ER -Urinalysis abnormal but this could be baseline given chronic Foley -Follow-up on urine culture -Has no fever and no leukocytosis , no indication for antibiotics  Diabetes mellitus - Discharge on home medication  Hypertension -Discharged on home medication  Chronic diastolic CHF (congestive heart failure) -Appears compensated at this point  Consults   None Significant Tests:  See full reports for all details    Dg Chest 2 View  11/10/2014   CLINICAL DATA:  Shortness of breath and chest pressure on the left today.  EXAM: CHEST  2 VIEW  COMPARISON:  Single view of the chest 08/27/2014. CT chest 09/14/2014.  FINDINGS: There is cardiomegaly without edema. The lungs are clear. No pneumothorax or pleural effusion.  IMPRESSION: Cardiomegaly without acute disease.   Electronically Signed   By: [name] M.D.   On: 11/10/2014 15:22     Today   Subjective:   [name] today has no headache,no chest abdominal pain,no new weakness tingling or numbness, feels much better today, episode of nontypical chest pain overnight, resolved with GI cocktail.  Objective:   Blood pressure 136/53, pulse 63, temperature 98.2 F (36.8 C), temperature source Oral, resp. rate 17, weight 153.8 kg (339  lb 1.1 oz), SpO2 98 %.  Intake/Output Summary (Last 24 hours) at 11/11/14 1016 Last data filed at 11/11/14 1003  Gross per 24 hour  Intake   1440 ml  Output   2850 ml  Net  -1410 ml    Exam Awake Alert, Oriented *3, No new F.N deficits, Normal affect [place].AT,PERRAL Supple Neck,No JVD, No cervical lymphadenopathy appriciated.  Symmetrical Chest wall movement, Good air movement bilaterally, CTAB, reproducible chest pain on palpation. RRR,No Gallops,Rubs or new Murmurs, No Parasternal Heave +ve B.Sounds, Abd Soft, Non tender, No organomegaly appriciated, No rebound -guarding or rigidity. No Cyanosis, Clubbing or edema, No new Rash or bruise  Data Review   Cultures -  Results for orders placed or performed during the hospital encounter of 11/10/14  MRSA PCR Screening     Status: Abnormal   Collection Time: 11/10/14 11:44 PM  Result Value Ref Range Status   MRSA by PCR POSITIVE (A) NEGATIVE Final    Comment:        The GeneXpert MRSA Assay (FDA approved for NASAL specimens only), is one component of a comprehensive MRSA colonization surveillance program. It is not intended to diagnose MRSA infection nor to guide  or monitor treatment for MRSA infections. RESULT CALLED TO, READ BACK BY AND VERIFIED WITH: R.[facility] 0302 11/11/14 M.CAMPBELL      CBC w Diff:  Lab Results  Component Value Date   WBC 7.6 11/10/2014   HGB 13.7 11/10/2014   HCT 40.2 11/10/2014   PLT 190 11/10/2014   LYMPHOPCT 18 09/20/2014   MONOPCT 7 09/20/2014   EOSPCT 3 09/20/2014   BASOPCT 1 09/20/2014   CMP:  Lab Results  Component Value Date   NA 134* 11/10/2014   K 3.6 11/10/2014   CL 98 11/10/2014   CO2 27 11/10/2014   BUN <5* 11/10/2014   CREATININE 0.71 11/10/2014   CREATININE 0.81 09/20/2014   PROT 6.0 09/20/2014   ALBUMIN 3.2* 09/20/2014   BILITOT 0.4 09/20/2014   ALKPHOS 74 09/20/2014   AST 12 09/20/2014   ALT 11 09/20/2014  .  Micro Results Recent Results (from the past 240  hour(s))  MRSA PCR Screening     Status: Abnormal   Collection Time: 11/10/14 11:44 PM  Result Value Ref Range Status   MRSA by PCR POSITIVE (A) NEGATIVE Final    Comment:        The GeneXpert MRSA Assay (FDA approved for NASAL specimens only), is one component of a comprehensive MRSA colonization surveillance program. It is not intended to diagnose MRSA infection nor to guide or monitor treatment for MRSA infections. RESULT CALLED TO, READ BACK BY AND VERIFIED WITH: R.[facility] 0302 11/11/14 M.CAMPBELL      Discharge Instructions          Follow-up Information    Follow up with Brian Shelling, MD In 1 week.   Specialty:  Internal Medicine   Why:  Posthospitalization follow-up for nontypical chest pain   Contact information:   [address] [place] [address] [place] [address] [phone]       Follow up with Brian Furbish, MD. Schedule an appointment as soon as possible for a visit in 2 weeks.   Specialty:  Cardiology   Why:  Posthospitalization follow-up for chest pain   Contact information:   [address] [place] [address] [place] [address] [phone]       Discharge Medications     Medication List    STOP taking these medications        sulfamethoxazole-trimethoprim 800-160 MG per tablet  Commonly known as:  BACTRIM DS,SEPTRA DS      TAKE these medications        aspirin EC 81 MG tablet  Take 81 mg by mouth every morning.     diphenhydrAMINE 25 MG tablet  Commonly known as:  SOMINEX  Take 25 mg by mouth at bedtime as needed for sleep.     diphenoxylate-atropine 2.5-0.025 MG per tablet  Commonly known as:  LOMOTIL  Take 1 tablet by mouth 3 (three) times daily as needed. For diarrhea     fexofenadine 180 MG tablet  Commonly known as:  ALLEGRA  Take 180 mg by mouth at bedtime as needed for allergies.     furosemide 40 MG tablet  Commonly known as:  LASIX  Take 1 tablet (40 mg total) by mouth daily.     HUMULIN 70/30 (70-30)  100 UNIT/ML injection  Generic drug:  insulin NPH-regular Human  Inject 50 Units into the skin 2 (two) times daily.     HYDROcodone-acetaminophen 5-325 MG per tablet  Commonly known as:  NORCO/VICODIN  Take one to two tablets by mouth every 4  hours as needed for pain. Not to exceed 3000mg  of APAP     isosorbide mononitrate 30 MG 24 hr tablet  Commonly known as:  IMDUR  Take 1 tablet (30 mg total) by mouth daily.     lisinopril 5 MG tablet  Commonly known as:  PRINIVIL,ZESTRIL  Take 5 mg by mouth daily.     metoprolol tartrate 25 MG tablet  Commonly known as:  LOPRESSOR  Take 1 tablet (25 mg total) by mouth 2 (two) times daily.     nitroGLYCERIN 0.4 MG SL tablet  Commonly known as:  NITROSTAT  Place 0.4 mg under the tongue every 5 (five) minutes as needed for chest pain.     omeprazole 20 MG capsule  Commonly known as:  PRILOSEC  Take 20 mg by mouth daily.     oxybutynin 5 MG tablet  Commonly known as:  DITROPAN  Take 5 mg by mouth 3 (three) times daily as needed for bladder spasms.     Sennosides 25 MG Tabs  Take 25 mg by mouth daily as needed (for constipation).         Total Time in preparing paper work, data evaluation and todays exam - 35 minutes  Arionna Hoggard M.D on 11/11/2014 at 10:16 AM  [facility] Office  [phone]

## 2014-11-12 DIAGNOSIS — L89312 Pressure ulcer of right buttock, stage 2: Secondary | ICD-10-CM | POA: Diagnosis not present

## 2014-11-12 DIAGNOSIS — L89612 Pressure ulcer of right heel, stage 2: Secondary | ICD-10-CM | POA: Diagnosis not present

## 2014-11-14 ENCOUNTER — Encounter (HOSPITAL_COMMUNITY): Payer: Self-pay | Admitting: Oncology

## 2014-11-14 ENCOUNTER — Emergency Department (HOSPITAL_COMMUNITY): Payer: Medicare Other

## 2014-11-14 ENCOUNTER — Encounter (HOSPITAL_COMMUNITY): Payer: Self-pay | Admitting: Emergency Medicine

## 2014-11-14 ENCOUNTER — Inpatient Hospital Stay (HOSPITAL_COMMUNITY)
Admission: EM | Admit: 2014-11-14 | Discharge: 2014-11-19 | DRG: 641 | Disposition: A | Discharge status: Home Health | Payer: Medicare Other | Attending: Internal Medicine | Admitting: Internal Medicine

## 2014-11-14 ENCOUNTER — Emergency Department (HOSPITAL_COMMUNITY)
Admission: EM | Admit: 2014-11-14 | Discharge: 2014-11-14 | Disposition: A | Discharge status: Home / Self Care | Payer: Medicare Other | Source: Home / Self Care | Attending: Emergency Medicine | Admitting: Emergency Medicine

## 2014-11-14 DIAGNOSIS — Z978 Presence of other specified devices: Secondary | ICD-10-CM

## 2014-11-14 DIAGNOSIS — E669 Obesity, unspecified: Secondary | ICD-10-CM | POA: Diagnosis present

## 2014-11-14 DIAGNOSIS — I509 Heart failure, unspecified: Secondary | ICD-10-CM | POA: Diagnosis not present

## 2014-11-14 DIAGNOSIS — E119 Type 2 diabetes mellitus without complications: Secondary | ICD-10-CM | POA: Diagnosis not present

## 2014-11-14 DIAGNOSIS — T83498A Other mechanical complication of other prosthetic devices, implants and grafts of genital tract, initial encounter: Secondary | ICD-10-CM | POA: Diagnosis not present

## 2014-11-14 DIAGNOSIS — Z8249 Family history of ischemic heart disease and other diseases of the circulatory system: Secondary | ICD-10-CM

## 2014-11-14 DIAGNOSIS — Z79899 Other long term (current) drug therapy: Secondary | ICD-10-CM

## 2014-11-14 DIAGNOSIS — Z9884 Bariatric surgery status: Secondary | ICD-10-CM

## 2014-11-14 DIAGNOSIS — R109 Unspecified abdominal pain: Secondary | ICD-10-CM

## 2014-11-14 DIAGNOSIS — R079 Chest pain, unspecified: Secondary | ICD-10-CM | POA: Diagnosis present

## 2014-11-14 DIAGNOSIS — N390 Urinary tract infection, site not specified: Secondary | ICD-10-CM | POA: Diagnosis not present

## 2014-11-14 DIAGNOSIS — I517 Cardiomegaly: Secondary | ICD-10-CM | POA: Diagnosis not present

## 2014-11-14 DIAGNOSIS — Z9119 Patient's noncompliance with other medical treatment and regimen: Secondary | ICD-10-CM | POA: Diagnosis present

## 2014-11-14 DIAGNOSIS — Z833 Family history of diabetes mellitus: Secondary | ICD-10-CM

## 2014-11-14 DIAGNOSIS — I5032 Chronic diastolic (congestive) heart failure: Secondary | ICD-10-CM | POA: Diagnosis present

## 2014-11-14 DIAGNOSIS — Z881 Allergy status to other antibiotic agents status: Secondary | ICD-10-CM

## 2014-11-14 DIAGNOSIS — Z885 Allergy status to narcotic agent status: Secondary | ICD-10-CM

## 2014-11-14 DIAGNOSIS — E1140 Type 2 diabetes mellitus with diabetic neuropathy, unspecified: Secondary | ICD-10-CM | POA: Diagnosis not present

## 2014-11-14 DIAGNOSIS — Z6841 Body Mass Index (BMI) 40.0 and over, adult: Secondary | ICD-10-CM

## 2014-11-14 DIAGNOSIS — T83028A Displacement of other indwelling urethral catheter, initial encounter: Secondary | ICD-10-CM | POA: Diagnosis not present

## 2014-11-14 DIAGNOSIS — E860 Dehydration: Principal | ICD-10-CM | POA: Diagnosis present

## 2014-11-14 DIAGNOSIS — Z96651 Presence of right artificial knee joint: Secondary | ICD-10-CM | POA: Diagnosis present

## 2014-11-14 DIAGNOSIS — M545 Low back pain: Secondary | ICD-10-CM | POA: Diagnosis present

## 2014-11-14 DIAGNOSIS — B9562 Methicillin resistant Staphylococcus aureus infection as the cause of diseases classified elsewhere: Secondary | ICD-10-CM | POA: Diagnosis present

## 2014-11-14 DIAGNOSIS — Z888 Allergy status to other drugs, medicaments and biological substances status: Secondary | ICD-10-CM

## 2014-11-14 DIAGNOSIS — R4189 Other symptoms and signs involving cognitive functions and awareness: Secondary | ICD-10-CM | POA: Diagnosis present

## 2014-11-14 DIAGNOSIS — G8929 Other chronic pain: Secondary | ICD-10-CM | POA: Diagnosis present

## 2014-11-14 DIAGNOSIS — R3989 Other symptoms and signs involving the genitourinary system: Secondary | ICD-10-CM | POA: Diagnosis present

## 2014-11-14 DIAGNOSIS — Z9049 Acquired absence of other specified parts of digestive tract: Secondary | ICD-10-CM | POA: Diagnosis present

## 2014-11-14 DIAGNOSIS — G4733 Obstructive sleep apnea (adult) (pediatric): Secondary | ICD-10-CM | POA: Diagnosis present

## 2014-11-14 DIAGNOSIS — Z87891 Personal history of nicotine dependence: Secondary | ICD-10-CM

## 2014-11-14 DIAGNOSIS — Z794 Long term (current) use of insulin: Secondary | ICD-10-CM

## 2014-11-14 DIAGNOSIS — I639 Cerebral infarction, unspecified: Secondary | ICD-10-CM | POA: Diagnosis present

## 2014-11-14 DIAGNOSIS — N319 Neuromuscular dysfunction of bladder, unspecified: Secondary | ICD-10-CM | POA: Diagnosis present

## 2014-11-14 DIAGNOSIS — Z7982 Long term (current) use of aspirin: Secondary | ICD-10-CM

## 2014-11-14 DIAGNOSIS — Z960 Presence of urogenital implants: Secondary | ICD-10-CM

## 2014-11-14 DIAGNOSIS — T839XXA Unspecified complication of genitourinary prosthetic device, implant and graft, initial encounter: Secondary | ICD-10-CM

## 2014-11-14 DIAGNOSIS — R55 Syncope and collapse: Secondary | ICD-10-CM | POA: Diagnosis present

## 2014-11-14 DIAGNOSIS — R404 Transient alteration of awareness: Secondary | ICD-10-CM | POA: Diagnosis present

## 2014-11-14 DIAGNOSIS — R197 Diarrhea, unspecified: Secondary | ICD-10-CM | POA: Diagnosis present

## 2014-11-14 DIAGNOSIS — T8351XA Infection and inflammatory reaction due to indwelling urinary catheter, initial encounter: Secondary | ICD-10-CM | POA: Diagnosis present

## 2014-11-14 DIAGNOSIS — E1165 Type 2 diabetes mellitus with hyperglycemia: Secondary | ICD-10-CM | POA: Diagnosis present

## 2014-11-14 DIAGNOSIS — R269 Unspecified abnormalities of gait and mobility: Secondary | ICD-10-CM

## 2014-11-14 DIAGNOSIS — I10 Essential (primary) hypertension: Secondary | ICD-10-CM | POA: Diagnosis present

## 2014-11-14 DIAGNOSIS — I2510 Atherosclerotic heart disease of native coronary artery without angina pectoris: Secondary | ICD-10-CM | POA: Diagnosis present

## 2014-11-14 DIAGNOSIS — I69951 Hemiplegia and hemiparesis following unspecified cerebrovascular disease affecting right dominant side: Secondary | ICD-10-CM

## 2014-11-14 DIAGNOSIS — K219 Gastro-esophageal reflux disease without esophagitis: Secondary | ICD-10-CM | POA: Diagnosis present

## 2014-11-14 DIAGNOSIS — Z86718 Personal history of other venous thrombosis and embolism: Secondary | ICD-10-CM

## 2014-11-14 DIAGNOSIS — R4020 Unspecified coma: Secondary | ICD-10-CM | POA: Diagnosis not present

## 2014-11-14 DIAGNOSIS — E0800 Diabetes mellitus due to underlying condition with hyperosmolarity without nonketotic hyperglycemic-hyperosmolar coma (NKHHC): Secondary | ICD-10-CM | POA: Diagnosis present

## 2014-11-14 DIAGNOSIS — R301 Vesical tenesmus: Secondary | ICD-10-CM | POA: Diagnosis present

## 2014-11-14 DIAGNOSIS — R32 Unspecified urinary incontinence: Secondary | ICD-10-CM | POA: Diagnosis not present

## 2014-11-14 DIAGNOSIS — M1389 Other specified arthritis, multiple sites: Secondary | ICD-10-CM | POA: Diagnosis present

## 2014-11-14 DIAGNOSIS — Z8673 Personal history of transient ischemic attack (TIA), and cerebral infarction without residual deficits: Secondary | ICD-10-CM | POA: Diagnosis not present

## 2014-11-14 DIAGNOSIS — E785 Hyperlipidemia, unspecified: Secondary | ICD-10-CM | POA: Diagnosis present

## 2014-11-14 DIAGNOSIS — I6789 Other cerebrovascular disease: Secondary | ICD-10-CM | POA: Diagnosis not present

## 2014-11-14 LAB — CBC WITH DIFFERENTIAL/PLATELET
BASOS ABS: 0.1 10*3/uL (ref 0.0–0.1)
Basophils Relative: 1 % (ref 0–1)
Eosinophils Absolute: 0.3 10*3/uL (ref 0.0–0.7)
Eosinophils Relative: 3 % (ref 0–5)
HEMATOCRIT: 42.6 % (ref 39.0–52.0)
HEMOGLOBIN: 13.8 g/dL (ref 13.0–17.0)
Lymphocytes Relative: 13 % (ref 12–46)
Lymphs Abs: 1.2 10*3/uL (ref 0.7–4.0)
MCH: 28.5 pg (ref 26.0–34.0)
MCHC: 32.4 g/dL (ref 30.0–36.0)
MCV: 88 fL (ref 78.0–100.0)
MONO ABS: 0.7 10*3/uL (ref 0.1–1.0)
Monocytes Relative: 8 % (ref 3–12)
NEUTROS ABS: 7 10*3/uL (ref 1.7–7.7)
NEUTROS PCT: 75 % (ref 43–77)
Platelets: 202 10*3/uL (ref 150–400)
RBC: 4.84 MIL/uL (ref 4.22–5.81)
RDW: 13.9 % (ref 11.5–15.5)
WBC: 9.3 10*3/uL (ref 4.0–10.5)

## 2014-11-14 LAB — COMPREHENSIVE METABOLIC PANEL
ALBUMIN: 3.2 g/dL — AB (ref 3.5–5.2)
ALK PHOS: 91 U/L (ref 39–117)
ALT: 15 U/L (ref 0–53)
AST: 20 U/L (ref 0–37)
Anion gap: 4 — ABNORMAL LOW (ref 5–15)
BUN: 6 mg/dL (ref 6–23)
CALCIUM: 8.3 mg/dL — AB (ref 8.4–10.5)
CO2: 29 mmol/L (ref 19–32)
CREATININE: 0.8 mg/dL (ref 0.50–1.35)
Chloride: 101 mmol/L (ref 96–112)
Glucose, Bld: 231 mg/dL — ABNORMAL HIGH (ref 70–99)
POTASSIUM: 4.2 mmol/L (ref 3.5–5.1)
SODIUM: 134 mmol/L — AB (ref 135–145)
Total Bilirubin: 0.8 mg/dL (ref 0.3–1.2)
Total Protein: 6.5 g/dL (ref 6.0–8.3)

## 2014-11-14 LAB — TROPONIN I

## 2014-11-14 MED ORDER — HYDROCODONE-ACETAMINOPHEN 5-325 MG PO TABS: 1.0000 | ORAL_TABLET | Freq: Two times a day (BID) | ORAL | Status: DC | PRN

## 2014-11-14 MED ORDER — HYDROCODONE-ACETAMINOPHEN 5-325 MG PO TABS
2.0000 | ORAL_TABLET | Freq: Once | ORAL | Status: AC
  Administered 2014-11-14: 2 via ORAL
  Filled 2014-11-14: qty 2

## 2014-11-14 NOTE — ED Provider Notes (Signed)
CSN: [id number]     Arrival date & time 11/14/14  1806 History   First MD Initiated Contact with Patient 11/14/14 1813     Chief Complaint  Patient presents with  . Diarrhea  . Loss of Consciousness     Patient is a 69 y.o. male presenting with diarrhea and syncope. The history is provided by the patient. No language interpreter was used.  Diarrhea Loss of Consciousness  [name] presents to the Emergency Department for evaluation of diarrhea.  He started with diffuse diarrhea starting 8 hours ago.  His bowel movements are watery and nonbloody.  He has associated lower abdominal pain, nausea.  No fevers, no vomiting.  He was brought in by EMS and per report he syncopized twice on the stretcher for about 30 seconds each episode.  He does not recall the event.  He has a hx/o syncope in the past.  He reports some chest discomfort today, which he occasionally has.  He has an indwelling foley and was seen for catheter problems in the ED early today because he accidentally pulled the catheter.  He lives alone.  Sxs are severe, constant.   Past Medical History  Diagnosis Date  . Obese   . Hypertension   . Diabetic neuropathy, painful   . Chronic indwelling Foley catheter   . Bladder spasms   . Hyperlipidemia   . GERD (gastroesophageal reflux disease)   . Neurogenic bladder   . CAD (coronary artery disease)   . PONV (postoperative nausea and vomiting)   . CHF (congestive heart failure)   . DVT (deep venous thrombosis) 1980's    LLE  . Type II diabetes mellitus   . Diabetic diarrhea   . Pneumonia 1999  . OSA (obstructive sleep apnea)     "they wanted me to wear a mask; I couldn't" (11/10/2014)  . Arthritis     "hands, ankles, knees" (11/10/2014)  . Chronic lower back pain   . Stroke 10/2011; 06/2014    right hand numbness/notes 10/20/2011, pt not sure he had stroke in 2013; "right hand weaker and right face not quite right since" (11/10/2014)   Past Surgical History  Procedure Laterality  Date  . Gastroplasty    . Total knee arthroplasty Right 1990's  . Left heart catheterization with coronary angiogram N/A 10/24/2011    Procedure: LEFT HEART CATHETERIZATION WITH CORONARY ANGIOGRAM;  Surgeon: Candee Furbish, MD;  Location: [facility] CATH LAB;  Service: Cardiovascular;  Laterality: N/A;  right radial artery approach  . Cholecystectomy open  1970's?  . Joint replacement    . Carpal tunnel release Bilateral ~ 2003-2004   Family History  Problem Relation Age of Onset  . Hypertension Mother   . Diabetes type I Father   . Cancer Brother      Testicular Cancer  . Cancer Brother     Leukemia   History  Substance Use Topics  . Smoking status: Former Smoker -- 2.00 packs/day for 20 years    Types: Cigarettes    Quit date: 09/10/1976  . Smokeless tobacco: Never Used  . 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  Gastrointestinal: Positive for diarrhea.  All other systems reviewed and are negative.     Allergies  Ace inhibitors; Lipitor; Metformin and related; Cefadroxil; Cephalexin; Morphine and related; and Robaxin  Home Medications   Prior to Admission medications   Medication Sig Start  Date End Date Taking? Authorizing Provider  aspirin EC 81 MG tablet Take 81 mg by mouth every morning.    Yes Historical Provider, MD  diphenhydrAMINE (SOMINEX) 25 MG tablet Take 25 mg by mouth at bedtime as needed for sleep.   Yes Historical Provider, MD  diphenoxylate-atropine (LOMOTIL) 2.5-0.025 MG per tablet Take 1 tablet by mouth 3 (three) times daily as needed. For diarrhea 09/06/14  Yes Tiffany L Reed, DO  fexofenadine (ALLEGRA) 180 MG tablet Take 180 mg by mouth at bedtime as needed for allergies.    Yes Historical Provider, MD  furosemide (LASIX) 40 MG tablet Take 1 tablet (40 mg total) by mouth daily. 04/03/14  Yes Irven Shelling, MD  HUMULIN 70/30 (70-30) 100 UNIT/ML injection  Inject 50 Units into the skin 2 (two) times daily. 10/15/14  Yes Historical Provider, MD  HYDROcodone-acetaminophen (NORCO/VICODIN) 5-325 MG per tablet Take 1 tablet by mouth 2 (two) times daily as needed for severe pain. 11/14/14  Yes Everlene Balls, MD  isosorbide mononitrate (IMDUR) 30 MG 24 hr tablet Take 1 tablet (30 mg total) by mouth daily. 08/31/14  Yes Costin Karlyne Greenspan, MD  lisinopril (PRINIVIL,ZESTRIL) 5 MG tablet Take 5 mg by mouth daily. 09/28/14  Yes Historical Provider, MD  metoprolol tartrate (LOPRESSOR) 25 MG tablet Take 1 tablet (25 mg total) by mouth 2 (two) times daily. 04/03/14  Yes Irven Shelling, MD  omeprazole (PRILOSEC) 20 MG capsule Take 20 mg by mouth daily.   Yes Historical Provider, MD  oxybutynin (DITROPAN) 5 MG tablet Take 5 mg by mouth 3 (three) times daily as needed for bladder spasms.   Yes Historical Provider, MD  Sennosides 25 MG TABS Take 25 mg by mouth daily as needed (for constipation).   Yes Historical Provider, MD  nitroGLYCERIN (NITROSTAT) 0.4 MG SL tablet Place 0.4 mg under the tongue every 5 (five) minutes as needed for chest pain.    Historical Provider, MD   BP 106/54 mmHg  Pulse 68  Temp(Src) 98.2 F (36.8 C) (Oral)  Resp 17  SpO2 100% Physical Exam  Constitutional: He is oriented to person, place, and time. He appears well-developed and well-nourished.  obese  HENT:  Head: Normocephalic and atraumatic.  Cardiovascular: Normal rate and regular rhythm.   No murmur heard. Pulmonary/Chest: Effort normal and breath sounds normal. No respiratory distress.  Abdominal: Soft. There is no tenderness. There is no rebound and no guarding.  Genitourinary:  Indwelling foley catheter  Musculoskeletal: He exhibits no tenderness.  1+ pitting edema of BLE  Neurological: He is alert and oriented to person, place, and time.  Mildly confused, generalized weakness.  Skin: Skin is warm and dry.  Psychiatric: He has a normal mood and affect. His behavior is normal.   Nursing note and vitals reviewed.   ED Course  Procedures (including critical care time) Labs Review Labs Reviewed  COMPREHENSIVE METABOLIC PANEL - Abnormal; Notable for the following:    Sodium 134 (*)    Glucose, Bld 231 (*)    Calcium 8.3 (*)    Albumin 3.2 (*)    Anion gap 4 (*)    All other components within normal limits  CLOSTRIDIUM DIFFICILE BY PCR  STOOL CULTURE  CBC WITH DIFFERENTIAL/PLATELET  TROPONIN I  FECAL LACTOFERRIN  TSH  HEMOGLOBIN A1C  LIPID PANEL    Imaging Review Dg Chest Port 1 View  11/14/2014   CLINICAL DATA:  Diarrhea.  Loss of consciousness.  EXAM: PORTABLE CHEST - 1 VIEW  COMPARISON:  11/10/2014  FINDINGS: Cardiac enlargement with mild vascular congestion. Negative for edema or effusion. Negative for infiltrate.  IMPRESSION: Cardiac enlargement with vascular congestion.  Negative for edema.   Electronically Signed   By: Franchot Gallo M.D.   On: 11/14/2014 19:40     EKG Interpretation None      MDM   Final diagnoses:  Diarrhea  Syncope, unspecified syncope type    Patient here for evaluation of profuse diarrhea, happened to have 2 syncopal events on the way to the emergency department, these were witnessed by EMS. Patient has no recollection of the syncopal events. Patient has multiple medical illnesses and difficulty ambulating at baseline due to neuropathy and debilitation. Given patient's profuse diarrhea and syncope at home there is concern for patient safety at home. Discussed with medicine regarding admission for evaluation of syncope as well as diarrhea.  Quintella Reichert, MD 11/15/14 [phone]

## 2014-11-14 NOTE — ED Notes (Signed)
Pt seen at [facility] this am for catheter issues. Pt reports once he was home he started having uncontrollable diarrhea. Pt sts he was sitting on the toilet for 4 hours which causes his legs to start hurting (hx neuropathy). otw to the hospital pt had a period of unresponsiveness lasting approx 30 mins. This happened 2 more times. Pt is unaware of these episodes. No ekg or vs changes with these episodes. cbg 243. Pt sts he does have a hx of this happening in the past.

## 2014-11-14 NOTE — ED Notes (Signed)
Balloon on foley deflated and re inflated w/ 6 ml NS.  Bladder scan revealed 0 ml's of urine.  Pt reports that pain is getting much better.

## 2014-11-14 NOTE — ED Provider Notes (Signed)
CSN: [id number]     Arrival date & time 11/14/14  0153 History   First MD Initiated Contact with Patient 11/14/14 0230     Chief Complaint  Patient presents with  . Pain    Pt pulled on foley however foley is still in.     (Consider location/radiation/quality/duration/timing/severity/associated sxs/prior Treatment) HPI [name] is a 69 y.o. male with past medical history of neurogenic bladder and chronic Foley presenting today with complications. Patient states he was walking around his house and stepped on his Foley tubing which pulled his Foley down. The Foley did not come out but he felt more severe pain and states it does not feel right. He denies any pain prior to this, he has not had any fevers. Patient follows up with urology and has an appointment on the 23rd of this month. He denies any hematuria. Patient has no further complaints.  10 Systems reviewed and are negative for acute change except as noted in the HPI.    Past Medical History  Diagnosis Date  . Obese   . Hypertension   . Diabetic neuropathy, painful   . Chronic indwelling Foley catheter   . Bladder spasms   . Hyperlipidemia   . GERD (gastroesophageal reflux disease)   . Neurogenic bladder   . CAD (coronary artery disease)   . PONV (postoperative nausea and vomiting)   . CHF (congestive heart failure)   . DVT (deep venous thrombosis) 1980's    LLE  . Type II diabetes mellitus   . Diabetic diarrhea   . Pneumonia 1999  . OSA (obstructive sleep apnea)     "they wanted me to wear a mask; I couldn't" (11/10/2014)  . Arthritis     "hands, ankles, knees" (11/10/2014)  . Chronic lower back pain   . Stroke 10/2011; 06/2014    right hand numbness/notes 10/20/2011, pt not sure he had stroke in 2013; "right hand weaker and right face not quite right since" (11/10/2014)   Past Surgical History  Procedure Laterality Date  . Gastroplasty    . Total knee arthroplasty Right 1990's  . Left heart catheterization with  coronary angiogram N/A 10/24/2011    Procedure: LEFT HEART CATHETERIZATION WITH CORONARY ANGIOGRAM;  Surgeon: Candee Furbish, MD;  Location: [facility] CATH LAB;  Service: Cardiovascular;  Laterality: N/A;  right radial artery approach  . Cholecystectomy open  1970's?  . Joint replacement    . Carpal tunnel release Bilateral ~ 2003-2004   Family History  Problem Relation Age of Onset  . Hypertension Mother   . Diabetes type I Father   . Cancer Brother      Testicular Cancer  . Cancer Brother     Leukemia   History  Substance Use Topics  . Smoking status: Former Smoker -- 2.00 packs/day for 20 years    Types: Cigarettes    Quit date: 09/10/1976  . Smokeless tobacco: Never Used  . 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 Authorizing Provider  aspirin EC 81 MG tablet Take 81 mg by mouth every morning.     Historical Provider, MD  diphenhydrAMINE (SOMINEX) 25 MG tablet Take 25 mg by mouth at bedtime as needed for sleep.  Historical Provider, MD  diphenoxylate-atropine (LOMOTIL) 2.5-0.025 MG per tablet Take 1 tablet by mouth 3 (three) times daily as needed. For diarrhea 09/06/14   Tiffany L Reed, DO  fexofenadine (ALLEGRA) 180 MG tablet Take 180 mg by mouth at bedtime as needed for allergies.     Historical Provider, MD  furosemide (LASIX) 40 MG tablet Take 1 tablet (40 mg total) by mouth daily. 04/03/14   Irven Shelling, MD  HUMULIN 70/30 (70-30) 100 UNIT/ML injection Inject 50 Units into the skin 2 (two) times daily. 10/15/14   Historical Provider, MD  HYDROcodone-acetaminophen (NORCO/VICODIN) 5-325 MG per tablet Take 1 tablet by mouth 2 (two) times daily as needed for severe pain. 11/14/14   Everlene Balls, MD   isosorbide mononitrate (IMDUR) 30 MG 24 hr tablet Take 1 tablet (30 mg total) by mouth daily. 08/31/14   Costin Karlyne Greenspan, MD  lisinopril (PRINIVIL,ZESTRIL) 5 MG tablet Take 5 mg by mouth daily. 09/28/14   Historical Provider, MD  metoprolol tartrate (LOPRESSOR) 25 MG tablet Take 1 tablet (25 mg total) by mouth 2 (two) times daily. 04/03/14   Irven Shelling, MD  nitroGLYCERIN (NITROSTAT) 0.4 MG SL tablet Place 0.4 mg under the tongue every 5 (five) minutes as needed for chest pain.    Historical Provider, MD  omeprazole (PRILOSEC) 20 MG capsule Take 20 mg by mouth daily.    Historical Provider, MD  oxybutynin (DITROPAN) 5 MG tablet Take 5 mg by mouth 3 (three) times daily as needed for bladder spasms.    Historical Provider, MD  Sennosides 25 MG TABS Take 25 mg by mouth daily as needed (for constipation).    Historical Provider, MD   BP 157/60 mmHg  Pulse 80  Temp(Src) 97.7 F (36.5 C) (Oral)  Resp 20  SpO2 99% Physical Exam  Constitutional: He is oriented to person, place, and time. Vital signs are normal. He appears well-developed and well-nourished.  Non-toxic appearance. He does not appear ill. No distress.  Obese male  HENT:  Head: Normocephalic and atraumatic.  Nose: Nose normal.  Mouth/Throat: Oropharynx is clear and moist. No oropharyngeal exudate.  Eyes: Conjunctivae and EOM are normal. Pupils are equal, round, and reactive to light. No scleral icterus.  Neck: Normal range of motion. Neck supple. No tracheal deviation, no edema, no erythema and normal range of motion present. No thyroid mass and no thyromegaly present.  Cardiovascular: Normal rate, regular rhythm, S1 normal, S2 normal, normal heart sounds, intact distal pulses and normal pulses.  Exam reveals no gallop and no friction rub.   No murmur heard. Pulses:      Radial pulses are 2+ on the right side, and 2+ on the left side.       Dorsalis pedis pulses are 2+ on the right side, and 2+ on the left side.   Pulmonary/Chest: Effort normal and breath sounds normal. No respiratory distress. He has no wheezes. He has no rhonchi. He has no rales.  Abdominal: Soft. Normal appearance and bowel sounds are normal. He exhibits no distension, no ascites and no mass. There is no hepatosplenomegaly. There is tenderness. There is no rebound, no guarding and no CVA tenderness.  Mild suprapubic tenderness to palpation.  Genitourinary:  Foley catheter in place, no bloody output seen.  Musculoskeletal: Normal range of motion. He exhibits no edema or tenderness.  Lymphadenopathy:    He has no cervical adenopathy.  Neurological: He is alert and oriented to person, place, and time. He has normal strength.  No cranial nerve deficit or sensory deficit.  Skin: Skin is warm, dry and intact. No petechiae and no rash noted. He is not diaphoretic. No erythema. No pallor.  Psychiatric: He has a normal mood and affect. His behavior is normal. Judgment normal.  Nursing note and vitals reviewed.   ED Course  Procedures (including critical care time) Labs Review Labs Reviewed - No data to display  Imaging Review No results found.   EKG Interpretation None      MDM   Final diagnoses:  Complication of Foley catheter, initial encounter    Patient presents emergency department for Foley catheter complication. Balloon was drained, Foley catheter was advanced further and reinflated. Patient immediately got pain relief from this. He still has mild suprapubic tenderness to palpation on exam. He was given Norco in emergency department as well as a Rx for a short course.  He was advised to move his urology appointment up sooner to be seen. His vital signs were within his normal limits and is safe for discharge.    Everlene Balls, MD 11/14/14 [phone]

## 2014-11-14 NOTE — ED Notes (Signed)
Per EMS pt states he was walking in his house when he stepped on the foley tubing causing severe pain however did not pull foley out.  Pt states that foley does not, "feel right."  No blood noted around meatus.

## 2014-11-14 NOTE — Discharge Instructions (Signed)
Foley Catheter Care [name], follow-up with urology within 3 days for continued management of your foley catherter. If you have worsening pain come back to emergency department immediately. Thank you. A Foley catheter is a soft, flexible tube. This tube is placed into your bladder to drain pee (urine). If you go home with this catheter in place, follow the instructions below. TAKING CARE OF THE CATHETER 1. Wash your hands with soap and water. 2. Put soap and water on a clean washcloth.  Clean the skin where the tube goes into your body.  Clean away from the tube site.  Never wipe toward the tube.  Clean the area using a circular motion.  Remove all the soap. Pat the area dry with a clean towel. For males, reposition the skin that covers the end of the penis (foreskin). 3. Attach the tube to your leg with tape or a leg strap. Do not stretch the tube tight. If you are using tape, remove any stickiness left behind by past tape you used. 4. Keep the drainage bag below your hips. Keep it off the floor. 5. Check your tube during the day. Make sure it is working and draining. Make sure the tube does not curl, twist, or bend. 6. Do not pull on the tube or try to take it out. TAKING CARE OF THE DRAINAGE BAGS You will have a large overnight drainage bag and a small leg bag. You may wear the overnight bag any time. Never wear the small bag at night. Follow the directions below. Emptying the Drainage Bag Empty your drainage bag when it is  - full or at least 2-3 times a day. 1. Wash your hands with soap and water. 2. Keep the drainage bag below your hips. 3. Hold the dirty bag over the toilet or clean container. 4. Open the pour spout at the bottom of the bag. Empty the pee into the toilet or container. Do not let the pour spout touch anything. 5. Clean the pour spout with a gauze pad or cotton ball that has rubbing alcohol on it. 6. Close the pour spout. 7. Attach the bag to your leg with tape  or a leg strap. 8. Wash your hands well. Changing the Drainage Bag Change your bag once a month or sooner if it starts to smell or look dirty.  1. Wash your hands with soap and water. 2. Pinch the rubber tube so that pee does not spill out. 3. Disconnect the catheter tube from the drainage tube at the connection valve. Do not let the tubes touch anything. 4. Clean the end of the catheter tube with an alcohol wipe. Clean the end of a the drainage tube with a different alcohol wipe. 5. Connect the catheter tube to the drainage tube of the clean drainage bag. 6. Attach the new bag to the leg with tape or a leg strap. Avoid attaching the new bag too tightly. 7. Wash your hands well. Cleaning the Drainage Bag 1. Wash your hands with soap and water. 2. Wash the bag in warm, soapy water. 3. Rinse the bag with warm water. 4. Fill the bag with a mixture of white vinegar and water (1 cup vinegar to 1 quart warm water [.2 liter vinegar to 1 liter warm water]). Close the bag and soak it for 30 minutes in the solution. 5. Rinse the bag with warm water. 6. Hang the bag to dry with the pour spout open and hanging downward. 7. Store the clean  bag (once it is dry) in a clean plastic bag. 8. Wash your hands well. PREVENT INFECTION  Wash your hands before and after touching your tube.  Take showers every day. Wash the skin where the tube enters your body. Do not take baths. Replace wet leg straps with dry ones, if this applies.  Do not use powders, sprays, or lotions on the genital area. Only use creams, lotions, or ointments as told by your doctor.  For females, wipe from front to back after going to the bathroom.  Drink enough fluids to keep your pee clear or pale yellow unless you are told not to have too much fluid (fluid restriction).  Do not let the drainage bag or tubing touch or lie on the floor.  Wear cotton underwear to keep the area dry. GET HELP IF:  Your pee is cloudy or smells  unusually bad.  Your tube becomes clogged.  You are not draining pee into the bag or your bladder feels full.  Your tube starts to leak. GET HELP RIGHT AWAY IF:  You have pain, puffiness (swelling), redness, or yellowish-white fluid (pus) where the tube enters the body.  You have pain in the belly (abdomen), legs, lower back, or bladder.  You have a fever.  You see blood fill the tube, or your pee is pink or red.  You feel sick to your stomach (nauseous), throw up (vomit), or have chills.  Your tube gets pulled out. MAKE SURE YOU:   Understand these instructions.  Will watch your condition.  Will get help right away if you are not doing well or get worse. Document Released: 12/22/2012 Document Revised: 01/11/2014 Document Reviewed: 12/22/2012 [facility] Patient Information 2015 [place], [place]. This information is not intended to replace advice given to you by your health care provider. Make sure you discuss any questions you have with your health care provider.

## 2014-11-14 NOTE — ED Notes (Signed)
Bed: [id number] Expected date:  Expected time:  Means of arrival:  Comments: 29M pulled out foley/painful

## 2014-11-14 NOTE — ED Notes (Signed)
[facility] called d/t pt needing transport via [facility].  Informed by [employer] that it is very busy and may take a long time before a unit can be dispatched.

## 2014-11-15 ENCOUNTER — Inpatient Hospital Stay (HOSPITAL_COMMUNITY): Payer: Medicare Other

## 2014-11-15 ENCOUNTER — Emergency Department (HOSPITAL_COMMUNITY): Payer: Medicare Other

## 2014-11-15 DIAGNOSIS — B9562 Methicillin resistant Staphylococcus aureus infection as the cause of diseases classified elsewhere: Secondary | ICD-10-CM | POA: Diagnosis present

## 2014-11-15 DIAGNOSIS — N390 Urinary tract infection, site not specified: Secondary | ICD-10-CM | POA: Diagnosis present

## 2014-11-15 DIAGNOSIS — E1140 Type 2 diabetes mellitus with diabetic neuropathy, unspecified: Secondary | ICD-10-CM | POA: Diagnosis present

## 2014-11-15 DIAGNOSIS — R197 Diarrhea, unspecified: Secondary | ICD-10-CM | POA: Diagnosis not present

## 2014-11-15 DIAGNOSIS — R55 Syncope and collapse: Secondary | ICD-10-CM | POA: Insufficient documentation

## 2014-11-15 DIAGNOSIS — Z9889 Other specified postprocedural states: Secondary | ICD-10-CM

## 2014-11-15 DIAGNOSIS — E669 Obesity, unspecified: Secondary | ICD-10-CM | POA: Diagnosis present

## 2014-11-15 DIAGNOSIS — Z8673 Personal history of transient ischemic attack (TIA), and cerebral infarction without residual deficits: Secondary | ICD-10-CM | POA: Diagnosis not present

## 2014-11-15 DIAGNOSIS — K219 Gastro-esophageal reflux disease without esophagitis: Secondary | ICD-10-CM | POA: Diagnosis present

## 2014-11-15 DIAGNOSIS — R269 Unspecified abnormalities of gait and mobility: Secondary | ICD-10-CM | POA: Diagnosis present

## 2014-11-15 DIAGNOSIS — R301 Vesical tenesmus: Secondary | ICD-10-CM

## 2014-11-15 DIAGNOSIS — Z96651 Presence of right artificial knee joint: Secondary | ICD-10-CM | POA: Diagnosis present

## 2014-11-15 DIAGNOSIS — E118 Type 2 diabetes mellitus with unspecified complications: Secondary | ICD-10-CM | POA: Diagnosis not present

## 2014-11-15 DIAGNOSIS — R531 Weakness: Secondary | ICD-10-CM | POA: Diagnosis not present

## 2014-11-15 DIAGNOSIS — Z794 Long term (current) use of insulin: Secondary | ICD-10-CM | POA: Diagnosis not present

## 2014-11-15 DIAGNOSIS — R1031 Right lower quadrant pain: Secondary | ICD-10-CM | POA: Diagnosis not present

## 2014-11-15 DIAGNOSIS — E119 Type 2 diabetes mellitus without complications: Secondary | ICD-10-CM | POA: Diagnosis not present

## 2014-11-15 DIAGNOSIS — I2510 Atherosclerotic heart disease of native coronary artery without angina pectoris: Secondary | ICD-10-CM | POA: Diagnosis present

## 2014-11-15 DIAGNOSIS — R109 Unspecified abdominal pain: Secondary | ICD-10-CM | POA: Diagnosis not present

## 2014-11-15 DIAGNOSIS — Z9884 Bariatric surgery status: Secondary | ICD-10-CM | POA: Diagnosis not present

## 2014-11-15 DIAGNOSIS — I69951 Hemiplegia and hemiparesis following unspecified cerebrovascular disease affecting right dominant side: Secondary | ICD-10-CM | POA: Diagnosis not present

## 2014-11-15 DIAGNOSIS — M1389 Other specified arthritis, multiple sites: Secondary | ICD-10-CM | POA: Diagnosis present

## 2014-11-15 DIAGNOSIS — R079 Chest pain, unspecified: Secondary | ICD-10-CM | POA: Diagnosis not present

## 2014-11-15 DIAGNOSIS — Z885 Allergy status to narcotic agent status: Secondary | ICD-10-CM | POA: Diagnosis not present

## 2014-11-15 DIAGNOSIS — I10 Essential (primary) hypertension: Secondary | ICD-10-CM

## 2014-11-15 DIAGNOSIS — T8351XA Infection and inflammatory reaction due to indwelling urinary catheter, initial encounter: Secondary | ICD-10-CM | POA: Diagnosis present

## 2014-11-15 DIAGNOSIS — E1165 Type 2 diabetes mellitus with hyperglycemia: Secondary | ICD-10-CM

## 2014-11-15 DIAGNOSIS — I639 Cerebral infarction, unspecified: Secondary | ICD-10-CM | POA: Diagnosis not present

## 2014-11-15 DIAGNOSIS — I5032 Chronic diastolic (congestive) heart failure: Secondary | ICD-10-CM | POA: Diagnosis not present

## 2014-11-15 DIAGNOSIS — Z9049 Acquired absence of other specified parts of digestive tract: Secondary | ICD-10-CM | POA: Diagnosis present

## 2014-11-15 DIAGNOSIS — G4733 Obstructive sleep apnea (adult) (pediatric): Secondary | ICD-10-CM | POA: Diagnosis not present

## 2014-11-15 DIAGNOSIS — Z79899 Other long term (current) drug therapy: Secondary | ICD-10-CM | POA: Diagnosis not present

## 2014-11-15 DIAGNOSIS — R4189 Other symptoms and signs involving cognitive functions and awareness: Secondary | ICD-10-CM | POA: Diagnosis present

## 2014-11-15 DIAGNOSIS — N319 Neuromuscular dysfunction of bladder, unspecified: Secondary | ICD-10-CM | POA: Diagnosis present

## 2014-11-15 DIAGNOSIS — Z888 Allergy status to other drugs, medicaments and biological substances status: Secondary | ICD-10-CM | POA: Diagnosis not present

## 2014-11-15 DIAGNOSIS — Z881 Allergy status to other antibiotic agents status: Secondary | ICD-10-CM | POA: Diagnosis not present

## 2014-11-15 DIAGNOSIS — Z9119 Patient's noncompliance with other medical treatment and regimen: Secondary | ICD-10-CM | POA: Diagnosis present

## 2014-11-15 DIAGNOSIS — Z6841 Body Mass Index (BMI) 40.0 and over, adult: Secondary | ICD-10-CM | POA: Diagnosis not present

## 2014-11-15 DIAGNOSIS — I6789 Other cerebrovascular disease: Secondary | ICD-10-CM | POA: Diagnosis not present

## 2014-11-15 DIAGNOSIS — IMO0002 Reserved for concepts with insufficient information to code with codable children: Secondary | ICD-10-CM | POA: Insufficient documentation

## 2014-11-15 DIAGNOSIS — E785 Hyperlipidemia, unspecified: Secondary | ICD-10-CM | POA: Diagnosis present

## 2014-11-15 DIAGNOSIS — Z8249 Family history of ischemic heart disease and other diseases of the circulatory system: Secondary | ICD-10-CM | POA: Diagnosis not present

## 2014-11-15 DIAGNOSIS — R404 Transient alteration of awareness: Secondary | ICD-10-CM | POA: Diagnosis not present

## 2014-11-15 DIAGNOSIS — Z86718 Personal history of other venous thrombosis and embolism: Secondary | ICD-10-CM | POA: Diagnosis not present

## 2014-11-15 DIAGNOSIS — Z833 Family history of diabetes mellitus: Secondary | ICD-10-CM | POA: Diagnosis not present

## 2014-11-15 DIAGNOSIS — Z87891 Personal history of nicotine dependence: Secondary | ICD-10-CM | POA: Diagnosis not present

## 2014-11-15 DIAGNOSIS — I509 Heart failure, unspecified: Secondary | ICD-10-CM | POA: Diagnosis not present

## 2014-11-15 DIAGNOSIS — E860 Dehydration: Secondary | ICD-10-CM | POA: Diagnosis present

## 2014-11-15 DIAGNOSIS — M545 Low back pain: Secondary | ICD-10-CM | POA: Diagnosis present

## 2014-11-15 DIAGNOSIS — G8929 Other chronic pain: Secondary | ICD-10-CM | POA: Diagnosis present

## 2014-11-15 DIAGNOSIS — Z7982 Long term (current) use of aspirin: Secondary | ICD-10-CM | POA: Diagnosis not present

## 2014-11-15 LAB — CBC WITH DIFFERENTIAL/PLATELET
BASOS ABS: 0.1 10*3/uL (ref 0.0–0.1)
Basophils Relative: 1 % (ref 0–1)
EOS ABS: 0.4 10*3/uL (ref 0.0–0.7)
EOS PCT: 5 % (ref 0–5)
HEMATOCRIT: 38.4 % — AB (ref 39.0–52.0)
HEMOGLOBIN: 12.9 g/dL — AB (ref 13.0–17.0)
LYMPHS ABS: 1.8 10*3/uL (ref 0.7–4.0)
Lymphocytes Relative: 23 % (ref 12–46)
MCH: 29.7 pg (ref 26.0–34.0)
MCHC: 33.6 g/dL (ref 30.0–36.0)
MCV: 88.5 fL (ref 78.0–100.0)
MONO ABS: 0.5 10*3/uL (ref 0.1–1.0)
Monocytes Relative: 7 % (ref 3–12)
NEUTROS PCT: 64 % (ref 43–77)
Neutro Abs: 5 10*3/uL (ref 1.7–7.7)
Platelets: 187 10*3/uL (ref 150–400)
RBC: 4.34 MIL/uL (ref 4.22–5.81)
RDW: 13.9 % (ref 11.5–15.5)
WBC: 7.8 10*3/uL (ref 4.0–10.5)

## 2014-11-15 LAB — URINALYSIS, ROUTINE W REFLEX MICROSCOPIC
BILIRUBIN URINE: NEGATIVE
Glucose, UA: NEGATIVE mg/dL
Ketones, ur: NEGATIVE mg/dL
NITRITE: POSITIVE — AB
Protein, ur: 100 mg/dL — AB
SPECIFIC GRAVITY, URINE: 1.011 (ref 1.005–1.030)
UROBILINOGEN UA: 0.2 mg/dL (ref 0.0–1.0)
pH: 6 (ref 5.0–8.0)

## 2014-11-15 LAB — COMPREHENSIVE METABOLIC PANEL
ALT: 14 U/L (ref 0–53)
AST: 15 U/L (ref 0–37)
Albumin: 2.9 g/dL — ABNORMAL LOW (ref 3.5–5.2)
Alkaline Phosphatase: 84 U/L (ref 39–117)
Anion gap: 5 (ref 5–15)
BUN: <5 mg/dL — ABNORMAL LOW (ref 6–23)
CO2: 30 mmol/L (ref 19–32)
Calcium: 8 mg/dL — ABNORMAL LOW (ref 8.4–10.5)
Chloride: 102 mmol/L (ref 96–112)
Creatinine, Ser: 0.77 mg/dL (ref 0.50–1.35)
GFR calc Af Amer: >90 mL/min (ref >90)
GFR calc non Af Amer: >90 mL/min (ref >90)
Glucose, Bld: 181 mg/dL — ABNORMAL HIGH (ref 70–99)
Potassium: 3.8 mmol/L (ref 3.5–5.1)
SODIUM: 137 mmol/L (ref 135–145)
TOTAL PROTEIN: 5.6 g/dL — AB (ref 6.0–8.3)
Total Bilirubin: 0.7 mg/dL (ref 0.3–1.2)

## 2014-11-15 LAB — GLUCOSE, CAPILLARY
GLUCOSE-CAPILLARY: 204 mg/dL — AB (ref 70–99)
GLUCOSE-CAPILLARY: 177 mg/dL — AB (ref 70–99)
Glucose-Capillary: 174 mg/dL — ABNORMAL HIGH (ref 70–99)
Glucose-Capillary: 165 mg/dL — ABNORMAL HIGH (ref 70–99)
Glucose-Capillary: 218 mg/dL — ABNORMAL HIGH (ref 70–99)

## 2014-11-15 LAB — URINE MICROSCOPIC-ADD ON

## 2014-11-15 LAB — TSH: TSH: 3.644 u[IU]/mL (ref 0.350–4.500)

## 2014-11-15 LAB — MAGNESIUM: Magnesium: 1.8 mg/dL (ref 1.5–2.5)

## 2014-11-15 LAB — LIPID PANEL
CHOLESTEROL: 176 mg/dL (ref 0–200)
HDL: 33 mg/dL — ABNORMAL LOW (ref >39)
LDL CALC: 121 mg/dL — AB (ref 0–99)
Total CHOL/HDL Ratio: 5.3 RATIO
Triglycerides: 112 mg/dL (ref <150)
VLDL: 22 mg/dL (ref 0–40)

## 2014-11-15 LAB — CLOSTRIDIUM DIFFICILE BY PCR: Toxigenic C. Difficile by PCR: NEGATIVE

## 2014-11-15 LAB — PHOSPHORUS: PHOSPHORUS: 3.3 mg/dL (ref 2.3–4.6)

## 2014-11-15 MED ORDER — INSULIN ASPART 100 UNIT/ML ~~LOC~~ SOLN
0.0000 [IU] | SUBCUTANEOUS | Status: DC
  Administered 2014-11-15: 3 [IU] via SUBCUTANEOUS
  Administered 2014-11-16 (×2): 5 [IU] via SUBCUTANEOUS
  Administered 2014-11-16 – 2014-11-17 (×4): 3 [IU] via SUBCUTANEOUS
  Administered 2014-11-18 (×2): 2 [IU] via SUBCUTANEOUS
  Administered 2014-11-19 (×2): 3 [IU] via SUBCUTANEOUS
  Administered 2014-11-19: 5 [IU] via SUBCUTANEOUS

## 2014-11-15 MED ORDER — MUPIROCIN 2 % EX OINT
1.0000 "application " | TOPICAL_OINTMENT | Freq: Two times a day (BID) | CUTANEOUS | Status: DC
  Administered 2014-11-15 – 2014-11-19 (×8): 1 via NASAL
  Filled 2014-11-15: qty 22

## 2014-11-15 MED ORDER — ACETAMINOPHEN 325 MG PO TABS: 650.0000 mg | ORAL_TABLET | Freq: Four times a day (QID) | ORAL | Status: DC | PRN

## 2014-11-15 MED ORDER — IOHEXOL 300 MG/ML  SOLN
25.0000 mL | INTRAMUSCULAR | Status: AC
  Administered 2014-11-15 (×2): 25 mL via ORAL

## 2014-11-15 MED ORDER — SODIUM CHLORIDE 0.9 % IV SOLN
INTRAVENOUS | Status: DC
  Administered 2014-11-15 – 2014-11-16 (×2): via INTRAVENOUS

## 2014-11-15 MED ORDER — CHLORHEXIDINE GLUCONATE CLOTH 2 % EX PADS
6.0000 | MEDICATED_PAD | Freq: Every day | CUTANEOUS | Status: DC
  Administered 2014-11-16 – 2014-11-19 (×4): 6 via TOPICAL

## 2014-11-15 MED ORDER — HYDROCODONE-ACETAMINOPHEN 5-325 MG PO TABS: 1.0000 | ORAL_TABLET | ORAL | Status: DC | PRN

## 2014-11-15 MED ORDER — ACETAMINOPHEN 650 MG RE SUPP: 650.0000 mg | Freq: Four times a day (QID) | RECTAL | Status: DC | PRN

## 2014-11-15 MED ORDER — ENOXAPARIN SODIUM 80 MG/0.8ML ~~LOC~~ SOLN
70.0000 mg | SUBCUTANEOUS | Status: DC
  Administered 2014-11-15 – 2014-11-18 (×4): 70 mg via SUBCUTANEOUS
  Filled 2014-11-15 (×6): qty 0.8

## 2014-11-15 MED ORDER — ASPIRIN EC 81 MG PO TBEC
81.0000 mg | DELAYED_RELEASE_TABLET | Freq: Every day | ORAL | Status: DC
  Administered 2014-11-15 – 2014-11-19 (×5): 81 mg via ORAL
  Filled 2014-11-15 (×6): qty 1

## 2014-11-15 MED ORDER — IOHEXOL 300 MG/ML  SOLN: 100.0000 mL | Freq: Once | INTRAMUSCULAR | Status: AC | PRN

## 2014-11-15 NOTE — Progress Notes (Addendum)
Dr. Wynelle Cleveland paged per pt request for diet order, MD would like pt to remain NPO, explained this to patient and he is not agreeable and would like a new doctor. Sherrie Mustache 4:43 PM   Dr. Wynelle Cleveland is okay with patient having CLD. 4:46 PM

## 2014-11-15 NOTE — Evaluation (Addendum)
Physical Therapy Evaluation Patient Details Name: [name] MRN: [id number] DOB: Oct 30, 1945 Today's Date: 11/15/2014   History of Present Illness  69 y.o. male with PMH significant for frequent syncope events, UTI, chronic indwelling foley catheter, Diabetes, HTN, who presents with diarrhea  Clinical Impression  [name] is pleasant and limited by abdominal pain at this point. Pt with partial loss of consciouslness event with transfer to EOB but quick return to alert and no frank LOB. Pt will benefit from acute therapy to maximize mobility, function, strength and safety to decrease burden of care and address all below deficits. Encouraged EOB later today as able to tolerate due to pain with assist for safety.    Follow Up Recommendations SNF;Supervision/Assistance - 24 hour    Equipment Recommendations  None recommended by PT    Recommendations for Other Services       Precautions / Restrictions Precautions Precautions: Fall Restrictions Weight Bearing Restrictions: No      Mobility  Bed Mobility Overal bed mobility: Needs Assistance Bed Mobility: Supine to Sit;Sit to Supine     Supine to sit: Min assist Sit to supine: Min assist   General bed mobility comments: pt able to come to sitting with use of rail and increased time. With elevating trunk off of surface pt had one of his syncopal events with eyes closed, leaning forward into rail but no frank LOB able to ease pt back to surface and have him arouse in a matter of seconds. Min assist to bring legs back onto surface with return to supine  Transfers                 General transfer comment: pt unable/unwilling to try at this time secondary to pain with scooting toward EOB due to rail and would not attempt standing at this time  Ambulation/Gait                Stairs            Wheelchair Mobility    Modified Rankin (Stroke Patients Only)       Balance Overall balance assessment: Needs  assistance   Sitting balance-Leahy Scale: Fair                                       Pertinent Vitals/Pain Pain Assessment: 0-10 Pain Score: 5  Pain Location: abdomen with movement Pain Descriptors / Indicators: Cramping Pain Intervention(s): Limited activity within patient's tolerance    Home Living Family/patient expects to be discharged to:: Private residence Living Arrangements: Alone Available Help at Discharge: Friend(s);Available PRN/intermittently Type of Home: House Home Access: Stairs to enter   Entrance Stairs-Number of Steps: 1 Home Layout: One level Home Equipment: Cane - single point;Grab bars - toilet;Grab bars - tub/shower;Electric scooter;Wheelchair - [employer] - 2 wheels;Adaptive equipment;Walker - standard Additional Comments: Pt takes sponge bath at sink. W/c and scooter do not fit inside the house so pt usually uses cane. Pt reports he is too large for his scooter and no longer feels safe using it in the community     Prior Function Level of Independence: Independent with assistive device(s)         Comments: previous SNF stay in Feb, has used all benefits, has little to no assistance, needs postacute rehab to improve functional capacity     Hand Dominance        Extremity/Trunk Assessment   Upper  Extremity Assessment: Generalized weakness           Lower Extremity Assessment: Generalized weakness      Cervical / Trunk Assessment: Normal  Communication   Communication: No difficulties  Cognition Arousal/Alertness: Awake/alert Behavior During Therapy: WFL for tasks assessed/performed Overall Cognitive Status: Within Functional Limits for tasks assessed                      General Comments      Exercises        Assessment/Plan    PT Assessment Patient needs continued PT services  PT Diagnosis Difficulty walking;Generalized weakness;Acute pain   PT Problem List Decreased strength;Decreased activity  tolerance;Decreased balance;Decreased mobility;Decreased safety awareness;Pain;Obesity  PT Treatment Interventions Gait training;DME instruction;Functional mobility training;Therapeutic activities;Therapeutic exercise;Balance training;Patient/family education   PT Goals (Current goals can be found in the Care Plan section) Acute Rehab PT Goals Patient Stated Goal: return home PT Goal Formulation: With patient Time For Goal Achievement: 11/29/14 Potential to Achieve Goals: Fair    Frequency Min 3X/week   Barriers to discharge Decreased caregiver support      Co-evaluation               End of Session   Activity Tolerance: Patient limited by pain Patient left: in bed;with call bell/phone within Wood Nurse Communication: Mobility status         Time: [id number] PT Time Calculation (min) (ACUTE ONLY): 18 min   Charges:   PT Evaluation $Initial PT Evaluation Tier I: 1 Procedure     PT G CodesMelford Wood 11/15/2014, 12:19 PM [name], [place]

## 2014-11-15 NOTE — Progress Notes (Signed)
Pt had 1.74 second pause on tele.  PA Baltazar Najjar paged and notified.  No new orders at this time.  Will continue to monitor.  Claudette Stapler, RN

## 2014-11-15 NOTE — Progress Notes (Signed)
Utilization review completed. Joshlyn Beadle, RN, BSN. 

## 2014-11-15 NOTE — Progress Notes (Signed)
VASCULAR LAB PRELIMINARY  PRELIMINARY  PRELIMINARY  PRELIMINARY  Carotid Duplex completed.    Preliminary report:  Mild heterogeneous plaque bilaterally.  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                                 RT ICA/CCA ratio: 0.98    1-39% stenosis.                                 LT ICA/CCA ratio: 1.48     1-39% stenosis.  (upper limits of 1-39% range)  August Albino, RVT 11/15/2014, 11:45 AM

## 2014-11-15 NOTE — Progress Notes (Signed)
PT Cancellation Note  Patient Details Name: [name] MRN: [id number] DOB: [birth year]   Cancelled Treatment:    Reason Eval/Treat Not Completed: Medical issues which prohibited therapy (pt currently on strict bedrest and need increased activity order for eval)   Lanetta Inch Beth 11/15/2014, 7:39 AM Elwyn Reach, [place]

## 2014-11-15 NOTE — Progress Notes (Signed)
MD paged to notify of patient's refusal of CT. [name] S 1:47 PM

## 2014-11-15 NOTE — Clinical Social Work Psychosocial (Signed)
Clinical Social Work Department BRIEF PSYCHOSOCIAL ASSESSMENT 11/15/2014  Patient:  [name], [name]     Account Number:  [id number]     Admit date:  11/14/2014  Clinical Social Worker:  Domenica Reamer, [place]  Date/Time:  11/15/2014 05:01 PM  Referred by:  Physician  Date Referred:  11/15/2014 Referred for  SNF Placement   Other Referral:   Interview type:  Patient Other interview type:    PSYCHOSOCIAL DATA Living Status:  ALONE Admitted from facility:   Level of care:   Primary support name:  [name] Primary support relationship to patient:  FRIEND Degree of support available:   patient states that he doesn't have any family in the area but has many friends who are able to come help him if he needs    CURRENT CONCERNS Current Concerns  Post-Acute Placement   Other Concerns:    SOCIAL WORK ASSESSMENT / PLAN [name] spoke with patient concerning PT recommendation for SNF placement at time of DC.  Patient states that he has been to SNF before and is unable to return because he is out of Medicare days- states that his last stay in rehab was less than 30 days ago.  Patient feels as if he will be ok at home.   Assessment/plan status:  Psychosocial Support/Ongoing Assessment of Needs Other assessment/ plan:   none   Information/referral to community resources:    PATIENT'S/FAMILY'S RESPONSE TO PLAN OF CARE: Patient would be agreeable to SNF placement but is unable to go due ot financial restrictions- patient feels ok going home alone with home health services.       Domenica Reamer, [place] Social Worker [phone]

## 2014-11-15 NOTE — ED Notes (Signed)
Patient transported to MRI 

## 2014-11-15 NOTE — H&P (Signed)
[facility] History and Physical  [name] [id number] DOB: [birth year] DOA: 11/14/2014  Referring physician: N/A PCP: Irven Shelling, MD  Specialists: N/A  Chief Complaint: Uncontrollable diarrhea watery    HPI: [name] is a 69 y.o. WM PMHx  DVT, stroke (with residual right-sided weakness), diabetes type 2 with complications, diabetic neuropathy painful, neurogenic bladder, obstructive sleep apnea not on CPAP, HTN, Chronic Diastolic CHF, chronic indwelling Foley catheter. Patient was just discharged on 3/3 from [facility], and just prior to that discharge from rehabilitation Center on 2/9. For similar problems. Started with diffuse diarrhea starting 8 hours ago. His bowel movements are watery and nonbloody. He has associated lower abdominal pain, nausea. No fevers, no vomiting. He was brought in by EMS and per report he syncopized twice on the stretcher for about 30 seconds each episode. He does not recall the event. He has a hx/o syncope in the past. Reports some chest discomfort today, which he occasionally has. He has an indwelling foley and was seen for catheter problems in the ED early today because he accidentally pulled the catheter. He lives alone.    Review of Systems: The patient denies anorexia, fever, weight loss,, vision loss, decreased hearing, hoarseness, chest pain, syncope, dyspnea on exertion, peripheral edema, balance deficits, hemoptysis, abdominal pain, melena, hematochezia, severe indigestion/heartburn, hematuria, incontinence, genital sores, muscle weakness, suspicious skin lesions, transient blindness, difficulty walking, depression, unusual weight change, abnormal bleeding, enlarged lymph nodes, angioedema, and breast masses.    TRAVEL HISTORY:    Consultants:    Procedure/Significant Events:  10/28/13 echocardiogram;- Left ventricle: mild concentric hypertrophy.-LVEF=50% to 55%. - Aortic valve: mild stenosis. - Left atrium:  severely dilated. -   Culture  3/3 positive MRSA by PCR   Antibiotics:     DVT prophylaxis:    Devices     LINES / TUBES:     Past Medical History  Diagnosis Date  . Obese   . Hypertension   . Diabetic neuropathy, painful   . Chronic indwelling Foley catheter   . Bladder spasms   . Hyperlipidemia   . GERD (gastroesophageal reflux disease)   . Neurogenic bladder   . CAD (coronary artery disease)   . PONV (postoperative nausea and vomiting)   . CHF (congestive heart failure)   . DVT (deep venous thrombosis) 1980's    LLE  . Type II diabetes mellitus   . Diabetic diarrhea   . Pneumonia 1999  . OSA (obstructive sleep apnea)     "they wanted me to wear a mask; I couldn't" (11/10/2014)  . Arthritis     "hands, ankles, knees" (11/10/2014)  . Chronic lower back pain   . Stroke 10/2011; 06/2014    right hand numbness/notes 10/20/2011, pt not sure he had stroke in 2013; "right hand weaker and right face not quite right since" (11/10/2014)   Past Surgical History  Procedure Laterality Date  . Gastroplasty    . Total knee arthroplasty Right 1990's  . Left heart catheterization with coronary angiogram N/A 10/24/2011    Procedure: LEFT HEART CATHETERIZATION WITH CORONARY ANGIOGRAM;  Surgeon: Candee Furbish, MD;  Location: [facility] C[facility] LAB;  Service: Cardiovascular;  Laterality: N/A;  right radial artery approach  . Cholecystectomy open  1970's?  . Joint replacement    . Carpal tunnel release Bilateral ~ 2003-2004   Social History:  reports that he quit smoking about 38 years ago. His smoking use included Cigarettes. He has a 40 pack-year smoking history. He has never  used smokeless tobacco. He reports that he drinks alcohol. He reports that he does not use illicit drugs. where does patient live--home, by himself Can patient participate in ADLs? Yes  Allergies  Allergen Reactions  . Ace Inhibitors Swelling    Pt tolerates lisinopril  . Lipitor [Atorvastatin Calcium] Swelling  .  Metformin And Related Swelling  . Cefadroxil Hives  . Cephalexin Hives  . Morphine And Related Other (See Comments)    Sweating, feels like is "in rocky boat."  . Robaxin [Methocarbamol] Other (See Comments)    Feels like he is shaky    Family History  Problem Relation Age of Onset  . Hypertension Mother   . Diabetes type I Father   . Cancer Brother      Testicular Cancer  . Cancer Brother     Leukemia     Prior to Admission medications   Medication Sig Start Date End Date Taking? Authorizing Provider  aspirin EC 81 MG tablet Take 81 mg by mouth every morning.    Yes Historical Provider, MD  diphenhydrAMINE (SOMINEX) 25 MG tablet Take 25 mg by mouth at bedtime as needed for sleep.   Yes Historical Provider, MD  diphenoxylate-atropine (LOMOTIL) 2.5-0.025 MG per tablet Take 1 tablet by mouth 3 (three) times daily as needed. For diarrhea 09/06/14  Yes Tiffany L Reed, DO  fexofenadine (ALLEGRA) 180 MG tablet Take 180 mg by mouth at bedtime as needed for allergies.    Yes Historical Provider, MD  furosemide (LASIX) 40 MG tablet Take 1 tablet (40 mg total) by mouth daily. 04/03/14  Yes Irven Shelling, MD  HUMULIN 70/30 (70-30) 100 UNIT/ML injection Inject 50 Units into the skin 2 (two) times daily. 10/15/14  Yes Historical Provider, MD  HYDROcodone-acetaminophen (NORCO/VICODIN) 5-325 MG per tablet Take 1 tablet by mouth 2 (two) times daily as needed for severe pain. 11/14/14  Yes Everlene Balls, MD  isosorbide mononitrate (IMDUR) 30 MG 24 hr tablet Take 1 tablet (30 mg total) by mouth daily. 08/31/14  Yes Costin Karlyne Greenspan, MD  lisinopril (PRINIVIL,ZESTRIL) 5 MG tablet Take 5 mg by mouth daily. 09/28/14  Yes Historical Provider, MD  metoprolol tartrate (LOPRESSOR) 25 MG tablet Take 1 tablet (25 mg total) by mouth 2 (two) times daily. 04/03/14  Yes Irven Shelling, MD  omeprazole (PRILOSEC) 20 MG capsule Take 20 mg by mouth daily.   Yes Historical Provider, MD  oxybutynin (DITROPAN) 5 MG  tablet Take 5 mg by mouth 3 (three) times daily as needed for bladder spasms.   Yes Historical Provider, MD  Sennosides 25 MG TABS Take 25 mg by mouth daily as needed (for constipation).   Yes Historical Provider, MD  nitroGLYCERIN (NITROSTAT) 0.4 MG SL tablet Place 0.4 mg under the tongue every 5 (five) minutes as needed for chest pain.    Historical Provider, MD   Physical Exam: Filed Vitals:   11/14/14 2100 11/14/14 2130 11/14/14 2200 11/15/14 0024  BP: 119/52 122/53 110/40 119/58  Pulse: 60 62 65 65  Temp:      TempSrc:      Resp: 13 17 18 19   SpO2: 97% 97% 99% 99%     General: A/O 4, NAD, chest pain has resolved, negative SOB  Eyes: Pupils equal round reactive to light and accommodation  Neck: Negative JVD, negative lymphadenopathy  Cardiovascular: Regular rhythm and rate, negative murmurs rubs or gallops, normal S1/S2  Respiratory: Clear to auscultation bilateral  Abdomen: Pain to palpation diffusely  Skin:  Rash bilateral lower extremities; venous stasis ulcers?  Neurologic: Cranial nerves II through XII intact, tongue/uvula midline, mild right hemiparesis, sensation intact, right finger nose finger positive for pass pointing, left finger nose finger within normal limit, did not ambulate patient.  Labs on Admission:  Basic Metabolic Panel:  Recent Labs Lab 11/10/14 1302 11/14/14 1900  NA 134* 134*  K 3.6 4.2  CL 98 101  CO2 27 29  GLUCOSE 255* 231*  BUN <5* 6  CREATININE 0.71 0.80  CALCIUM 8.5 8.3*   Liver Function Tests:  Recent Labs Lab 11/14/14 1900  AST 20  ALT 15  ALKPHOS 91  BILITOT 0.8  PROT 6.5  ALBUMIN 3.2*   No results for input(s): LIPASE, AMYLASE in the last 168 hours. No results for input(s): AMMONIA in the last 168 hours. CBC:  Recent Labs Lab 11/10/14 1302 11/14/14 1900  WBC 7.6 9.3  NEUTROABS  --  7.0  HGB 13.7 13.8  HCT 40.2 42.6  MCV 87.4 88.0  PLT 190 202   Cardiac Enzymes:  Recent Labs Lab 11/10/14 2130  11/11/14 0445 11/11/14 1013 11/14/14 1900  TROPONINI <0.03 <0.03 <0.03 <0.03    BNP (last 3 results)  Recent Labs  09/14/14 0644  BNP 95.8    ProBNP (last 3 results)  Recent Labs  03/21/14 1551 04/01/14 1431 08/27/14 1548  PROBNP 2003.0* 658.6* 1121.0*    CBG:  Recent Labs Lab 11/10/14 1736 11/10/14 2122 11/11/14 0647 11/11/14 1127  GLUCAP 232* 227* 211* 239*    Radiological Exams on Admission: Dg Chest Port 1 View  11/14/2014   CLINICAL DATA:  Diarrhea.  Loss of consciousness.  EXAM: PORTABLE CHEST - 1 VIEW  COMPARISON:  11/10/2014  FINDINGS: Cardiac enlargement with mild vascular congestion. Negative for edema or effusion. Negative for infiltrate.  IMPRESSION: Cardiac enlargement with vascular congestion.  Negative for edema.   Electronically Signed   By: Franchot Gallo M.D.   On: 11/14/2014 19:40    EKG:   Assessment/Plan Principal Problem:   Syncope and collapse Active Problems:   DM (diabetes mellitus) with complications   Diabetic neuropathy   OSA (obstructive sleep apnea)   Painful bladder spasm   Hypertension   CVA (cerebral infarction)   Gait abnormality   Chronic indwelling Foley catheter   Obesity- BMI 49   Chronic diastolic CHF (congestive heart failure)   Diarrhea   Chest pain at rest   Syncope and collapse -Inc. peek could have been caused by vasovagal; i.e. multiple bouts of diarrhea however given patient's previous stroke, and other risk factors will complete syncope. Workup -Head CT pending -MRI/MRA head pending -TSH pending  Chest pain -Cycle troponins -Echocardiogram pending - Continue aspirin 81 mg daily  -Patient currently borderline hypertensive and with syncope and collapse will hold BP medication   Hypertension -Borderline hypotension; unable to obtain orthostatic vitals -See chest pain   Chronic diastolic CHF (congestive heart failure) -Obtain echocardiogram  OSA (obstructive sleep apnea) -CPAP per respiratory    Diabetes type 2 uncontrolled with complications -3/2 Hemoglobin A1c= 9.1 -Current CBGs poorly controlled; moderate SSI -Nothing by mouth  Chronic indwelling Foley catheter/Abnormal urinalysis -Urine appears cloudy and dark will obtain UA -Will not start antibiotics at this time  Obesity- BMI 49    Code Status: Full Family Communication: None Disposition Plan: R/O CVA, C. difficile. Discharged to SNF vs assisted living vs home health  Time spent: 65 min  Allie Bossier [facility] Pager [phone]  If 7PM-7AM, please  contact night-coverage [email or web address] Password [facility] 11/15/2014, 12:54 AM

## 2014-11-15 NOTE — Progress Notes (Signed)
Patient refusing CT of the abd/pelvis- I have spoken with him myself- he states the scanner looked too small for him- he was assured that it was not but he continues to decline it.   Debbe Odea, MD

## 2014-11-15 NOTE — Progress Notes (Addendum)
[facility] f/u note  Patient was discharged from the hospital on 3/3 after being admitted for chest pain which was determined to be non-cardiac and most likely GI related. Patient came to the ER earlier yesterday as he stepped on his foley and needed it to be re-adjusted. He went home in stable condition. Patient returned to the ER later in the day- was brought in by EMS for diarrhea-had 2 witness syncopal episodes en route. C/o diarrhea for days- innumerable amount yesterday. On exam has RUQ and RLQ tenderness.   Principal Problem:   Syncope  - most likely secondary to dehydration - MRI/ CT head negative - ECHO and Carotid dopplers pending  Active Problems: Diarrhea- right sided abdominal tenderness - f/u CT abd/pelvis - NPO- on IVF - C diff negative - GI pathogen panel and fecal lactoferrin pending  Chest pain at rest -this is not a new complaint- he had this on his prior admission as well-  was suspected to be GI related as it resolved after a GI cocktail - cardiac enzymes x 3 were negative at that time - no troponin ordered at this admission  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- no need to treat- previous culture from last admission revealed > 100,000 multiple morphotypes  HTN - resume Metoprolol and Lisinopril which were on hold due to hypotension     DM (diabetes mellitus) with complications - takes 0000000 50 U BID - on sliding scale for now- NPO    OSA (obstructive sleep apnea) - CPAP at bedtime    Obesity- BMI 49    Chronic diastolic CHF (congestive heart failure) - no on diuretics at home - compensated - will follow closely as he is on IVF  Debbe Odea, MD Pager on [email or web address]

## 2014-11-15 NOTE — Progress Notes (Signed)
Dr. Wynelle Cleveland paged, pt with 1.62 sec pause on tele. [name] S 9:54 AM

## 2014-11-15 NOTE — Progress Notes (Signed)
Pt refused bed alarm. [name] S 11:44 AM

## 2014-11-15 NOTE — Progress Notes (Signed)
*  PRELIMINARY RESULTS* Echocardiogram 2D Echocardiogram has been performed.  Leavy Cella 11/15/2014, 11:05 AM

## 2014-11-15 NOTE — Clinical Social Work Note (Signed)
[name] consulted for SNF placement- patient is out of Medicare days and is unable to pay privately for SNF stay.  Patient would like to return home with home health services- [name] informed RNCM.  [name] signing off- please reconsult if patient has further needs  Domenica Reamer, [place] Social Worker [phone]

## 2014-11-15 NOTE — Progress Notes (Signed)
Inpatient Diabetes Program Recommendations  AACE/ADA: New Consensus Statement on Inpatient Glycemic Control (2013)  Target Ranges:  Prepandial:   less than 140 mg/dL      Peak postprandial:   less than 180 mg/dL (1-2 hours)      Critically ill patients:  140 - 180 mg/dL   Results for [name], [name] (MRN [id number]) as of 11/15/2014 13:18  Ref. Range 11/15/2014 06:10 11/15/2014 08:14 11/15/2014 11:30  Glucose-Capillary Latest Range: 70-99 mg/dL 174 (H) 165 (H) 204 (H)   Diabetes history: DM2 Outpatient Diabetes medications: 70/30 30-50 units TID with meals Current orders for Inpatient glycemic control: Novolog 0-15 units Q4H   Inpatient Diabetes Program Recommendations Insulin - Basal: May want to consider ordering basal insulin. If patient will remain NPO, please conisder ordering Levemir 10 units Q24H. However, if diet is restarted may want to consider ordering 70/30 7 units BID and adjust accordingly.  Note: Spoke with patient about diabetes and home regimen for diabetes control. Patient reports that he is followed by his PCP for diabetes management and currently he takes Humulin 70/30 20-50 units (depending on glucose) BID as an outpatient for diabetes control. Patient states that he checks his glucose 2 times per day and his glucose usually runs from 220-300's mg/dl and he uses the glucose value to determine how much 70/30 he will take.  Inquired about knowledge about A1C and patient reports that he does not know what an A1C is. Discussed A1C results (9.1% on 11/10/14 and explained what an A1C is, A1C goals, basic pathophysiology of DM Type 2, basic home care, importance of checking CBGs and maintaining good CBG control to prevent long-term and short-term complications. Discussed impact of nutrition, exercise, stress, sickness, and medications on diabetes control.  Patient states that he is not able to get around very well and reports that his feet are completely numb for the past 2 weeks. Prior to  that, patient reports that he could not feel pin prick on the bottom of his feet but he could feel his feet and they were not completely numb. Inquired about any medications for neuropathy and patient reports that he is not taking any medication for neuropathy and that his PCP is not aware of the acute change with his feet being completely numb. Patient reports that when he was able to get around better and was more active his glucose was better controlled. Inquired about following carbohydrate modified diet. Patient reports that he drinks mostly water and unsweetened tea but he eats whatever he has in the house that does not require much preparation. Patient states that he has a friend and his wife that do his shopping for him. Discussed carbohydrates, carbohydrate goals per day and meal, along with portion sizes. Patient states that he will ask his friend to start looking at the carbohydrates on food labels and to get low carb choice items. Asked patient to try to check blood glucose 3-4 times per day and to keep a log of glucose values so he can take with him to his follow up visits with his PCP. Explained how the doctor could use the glucose values to make adjustments with his insulin regimen.  Patient verbalized understanding of information discussed and he states that he has no further questions at this time related to diabetes.   Thanks, Barnie Alderman, RN, MSN, CCRN Diabetes Coordinator Inpatient Diabetes Program [phone] (Team Pager) [phone] ([facility] office) [phone] [facility] office)

## 2014-11-15 NOTE — Progress Notes (Signed)
Spoke with pt regarding cpap.  Pt stated he does not wear one at home and does not want to try it here.  Pt was advised that RT is available all night and encouraged him to call should he change his mind.  RN aware.

## 2014-11-16 ENCOUNTER — Inpatient Hospital Stay (HOSPITAL_COMMUNITY): Payer: Medicare Other

## 2014-11-16 LAB — GLUCOSE, CAPILLARY
GLUCOSE-CAPILLARY: 174 mg/dL — AB (ref 70–99)
GLUCOSE-CAPILLARY: 191 mg/dL — AB (ref 70–99)
GLUCOSE-CAPILLARY: 240 mg/dL — AB (ref 70–99)
Glucose-Capillary: 157 mg/dL — ABNORMAL HIGH (ref 70–99)
Glucose-Capillary: 203 mg/dL — ABNORMAL HIGH (ref 70–99)

## 2014-11-16 LAB — HEMOGLOBIN A1C
Hgb A1c MFr Bld: 9.1 % — ABNORMAL HIGH (ref 4.8–5.6)
Mean Plasma Glucose: 214 mg/dL

## 2014-11-16 LAB — FECAL LACTOFERRIN, QUANT: Fecal Lactoferrin: NEGATIVE

## 2014-11-16 MED ORDER — INSULIN ASPART PROT & ASPART (70-30 MIX) 100 UNIT/ML ~~LOC~~ SUSP
30.0000 [IU] | Freq: Two times a day (BID) | SUBCUTANEOUS | Status: DC
  Administered 2014-11-16 – 2014-11-19 (×6): 30 [IU] via SUBCUTANEOUS
  Filled 2014-11-16: qty 10

## 2014-11-16 NOTE — Progress Notes (Addendum)
Nurse tech was assisting pt to [facility], when pt sat down he had 5-10 seconds of unresponsiveness, leaned left toward the bed, then came to.  Pt HR increased to 144. Pt currently sinus rhythm pulse 70, A&O and resting comfortably, BP 147/81.  Orthostatic vitals orders for this AM, pt does not feel comfortable completing these at this time.   PA Baltazar Najjar paged and notified.  No new orders at this time.  Will continue to monitor closely.  Claudette Stapler, RN

## 2014-11-16 NOTE — Progress Notes (Signed)
[facility] Progress Note   [name] [id number] DOB: 09/25/1945 DOA: 11/14/2014 PCP: Irven Shelling, MD  Brief narrative: [name] is a 69 y.o. male Patient was discharged from the hospital on 3/3 after being admitted for chest pain which was determined to be non-cardiac and most likely GI related. Patient came to the ER earlier yesterday as he stepped on his foley and needed it to be re-adjusted. He went home in stable condition. Patient returned to the ER later in the day- was brought in by EMS for diarrhea-had 2 witness syncopal episodes en route. C/o diarrhea for days- innumerable amount yesterday. On exam has RUQ and RLQ tenderness.    Subjective: Demanding a solid diet saying he is sick of clears. Refused orthostatics and refused to get up with PT today. Had another unresponsive episode when getting up to the bedside commode- see note in Epic.   Assessment/Plan: Principal Problem:   Syncope and collapse - ECHO and MRI unrevealing- refusing orthostatics - have asked for Neuro eval to r/o seizures - carotid duplex pending  Active Problems: Diarrhea- right sided abdominal tenderness - he refused CT abd/pelvis - refused to stay NPO- now on clears and demanding solid food which I have started-abdominal tenderness improved- will follow stool output - C diff negative - GI pathogen panel ans stool culture pending - fecal lactoferrin negatvie  Chest pain at rest -this is not a new complaint- he had this on his prior admission as well- was suspected to be GI related as it resolved after a GI cocktail - cardiac enzymes x 3 were negative at that time - no troponin ordered at this admission  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-- previous culture from last admission revealed > 100,000 multiple morphotypes - now growing staph- will follow and will check blood cultures x 2   HTN - resumed Metoprolol and Lisinopril which were on hold due to hypotension     DM (diabetes mellitus) with complications - takes 0000000 50 U BID- willstart 30 BID as we will be starting a diet today    OSA (obstructive sleep apnea) - CPAP at bedtime   Obesity- BMI 49   Chronic diastolic CHF (congestive heart failure) - not on diuretics at home - compensated - will follow closely as he is on IVF  Non compliance - refusing multiple treatments and tests - very doubtful that the takes his medications properly at home-  - unfortunately he has no SNF days left and will need to go home alone which makes him a very high risk for re-admission - will order HHRN/ PT to help prevent re- admission    Code Status: full code Family Communication:  Disposition Plan: home with [facility] DVT prophylaxis: Lovenox Consultants:Neuro Procedures:  Antibiotics: Anti-infectives    None      Objective: Filed Weights   11/15/14 0355 11/16/14 0338  Weight: 160.1 kg (352 lb 15.3 oz) 158.305 kg (349 lb)    Intake/Output Summary (Last 24 hours) at 11/16/14 0940 Last data filed at 11/16/14 0900  Gross per 24 hour  Intake   2176 ml  Output   4520 ml  Net  -2344 ml     Vitals Filed Vitals:   11/15/14 0355 11/15/14 1403 11/15/14 2028 11/16/14 0338  BP: 133/53 127/100 137/66 147/81  Pulse: 71 71 74 72  Temp: 98.3 F (36.8 C) 97.9 F (36.6 C) 98.1 F (36.7 C) 98 F (36.7 C)  TempSrc: Oral Oral Oral Oral  Resp:  18 19 20 16   Weight: 160.1 kg (352 lb 15.3 oz)   158.305 kg (349 lb)  SpO2: 99% 100% 99% 99%    Exam:  General:  Pt is alert, not in acute distress  HEENT: No icterus, No thrush  Cardiovascular: regular rate and rhythm, S1/S2 No murmur  Respiratory: clear to auscultation bilaterally   Abdomen: Soft, +Bowel sounds, non tender, non distended, no guarding  MSK: No LE edema, cyanosis or clubbing  Data Reviewed: Basic Metabolic Panel:  Recent Labs Lab 11/10/14 1302 11/14/14 1900 11/15/14 0527  NA 134* 134* 137  K 3.6 4.2 3.8  CL 98 101 102  CO2  27 29 30   GLUCOSE 255* 231* 181*  BUN <5* 6 <5*  CREATININE 0.71 0.80 0.77  CALCIUM 8.5 8.3* 8.0*  MG  --   --  1.8  PHOS  --   --  3.3   Liver Function Tests:  Recent Labs Lab 11/14/14 1900 11/15/14 0527  AST 20 15  ALT 15 14  ALKPHOS 91 84  BILITOT 0.8 0.7  PROT 6.5 5.6*  ALBUMIN 3.2* 2.9*   No results for input(s): LIPASE, AMYLASE in the last 168 hours. No results for input(s): AMMONIA in the last 168 hours. CBC:  Recent Labs Lab 11/10/14 1302 11/14/14 1900 11/15/14 0527  WBC 7.6 9.3 7.8  NEUTROABS  --  7.0 5.0  HGB 13.7 13.8 12.9*  HCT 40.2 42.6 38.4*  MCV 87.4 88.0 88.5  PLT 190 202 187   Cardiac Enzymes:  Recent Labs Lab 11/10/14 2130 11/11/14 0445 11/11/14 1013 11/14/14 1900  TROPONINI <0.03 <0.03 <0.03 <0.03   BNP (last 3 results)  Recent Labs  09/14/14 0644  BNP 95.8    ProBNP (last 3 results)  Recent Labs  03/21/14 1551 04/01/14 1431 08/27/14 1548  PROBNP 2003.0* 658.6* 1121.0*    CBG:  Recent Labs Lab 11/15/14 0814 11/15/14 1130 11/15/14 1627 11/15/14 2020 11/16/14 0348  GLUCAP 165* 204* 177* 218* 157*    Recent Results (from the past 240 hour(s))  Urine culture     Status: None   Collection Time: 11/10/14  1:34 PM  Result Value Ref Range Status   Specimen Description URINE, CLEAN CATCH  Final   Special Requests NONE  Final   Colony Count   Final    >=100,000 COLONIES/ML Performed at [facility]    Culture   Final    Multiple bacterial morphotypes present, none predominant. Suggest appropriate recollection if clinically indicated. Performed at [facility]    Report Status 11/11/2014 FINAL  Final  MRSA PCR Screening     Status: Abnormal   Collection Time: 11/10/14 11:44 PM  Result Value Ref Range Status   MRSA by PCR POSITIVE (A) NEGATIVE Final    Comment:        The GeneXpert MRSA Assay (FDA approved for NASAL specimens only), is one component of a comprehensive MRSA  colonization surveillance program. It is not intended to diagnose MRSA infection nor to guide or monitor treatment for MRSA infections. RESULT CALLED TO, READ BACK BY AND VERIFIED WITH: R.[facility] 0302 11/11/14 M.CAMPBELL   Clostridium Difficile by PCR     Status: None   Collection Time: 11/14/14  8:36 PM  Result Value Ref Range Status   C difficile by pcr NEGATIVE NEGATIVE Final  Culture, Urine     Status: None (Preliminary result)   Collection Time: 11/15/14  5:15 AM  Result Value Ref Range Status   Specimen  Description URINE, CATHETERIZED  Final   Special Requests NONE  Final   Colony Count   Final    >=100,000 COLONIES/ML Performed at [facility]    Culture   Final    STAPHYLOCOCCUS AUREUS Note: RIFAMPIN AND GENTAMICIN SHOULD NOT BE USED AS SINGLE DRUGS FOR TREATMENT OF STAPH INFECTIONS. Performed at [facility]    Report Status PENDING  Incomplete     Studies:  Recent x-ray studies have been reviewed in detail by the Attending Physician  Scheduled Meds:  Scheduled Meds: . aspirin EC  81 mg Oral Daily  . Chlorhexidine Gluconate Cloth  6 each Topical Q0600  . enoxaparin (LOVENOX) injection  70 mg Subcutaneous Q24H  . insulin aspart  0-15 Units Subcutaneous 6 times per day  . mupirocin ointment  1 application Nasal BID   Continuous Infusions: . sodium chloride 100 mL/hr at 11/16/14 0151    Time spent on care of this patient: > 35 min   [place], MD 11/16/2014, 9:40 AM  LOS: 1 day   [facility] Office  [phone] Pager - Text Page per [email or web address]  If 7PM-7AM, please contact night-coverage [email or web address]

## 2014-11-16 NOTE — Progress Notes (Signed)
EEG completed; results pending.    

## 2014-11-16 NOTE — Progress Notes (Addendum)
Patient working with occupational therapy RN in room also,  and had a 5-10 seconds of unresponsiveness/ unable to speak,/ eyes fixed straight ahead/staring, then came to, this happened while standing, pt assisted back to bed, will continue to monitor patient. Fedora Knisely, Bettina Gavia RN

## 2014-11-16 NOTE — Progress Notes (Signed)
Nursing note [name] with [name] this afternoon Dr. Wynelle Cleveland made aware and no new orders recieved.  Dr. Wynelle Cleveland also updated on [name] staring episode as documented previously. Will continue to monitor [name]. Random Dobrowski, Bettina Gavia RN

## 2014-11-16 NOTE — Consult Note (Signed)
NEURO HOSPITALIST CONSULT NOTE    Reason for Consult: syncope  HPI:                                                                                                                                          [name] is an 69 y.o. male who has been seen multiple times in hospital for syncope and recently evaluated in October for similar  Events to which he is presenting today. At that time Carotid dopplers were negative, MRI/MRA were negative, Echo was normal.  Patient was again seen in hospital for syncope in December 2015 at which time EEG was obtained.  EEG was normal. During those hospitalizations exam was noted to show multiple functional components. Patient was admitted to hospital on this account for diffuse diarrhea and en route was noted to have episodes of staring.  While in hospital he was noted to have a UTI and has had one episode of brief period which he was unresponsive after getting up to the commode. Neuro was consulted for syncope versus seizure.  On consultation with patient he states he has had these spells before but has never fallen or injured himself.  He does not recall event today but notes upon arousal he was very clear and oriented and "knew all the nurses who were standing around him."  In the note it states he refused orthostatics but when I asked the patient why he denies ever refusing orthostatic BP. HE is a very poor historian and will not give a straight answer to my questions.   Past Medical History  Diagnosis Date  . Obese   . Hypertension   . Diabetic neuropathy, painful   . Chronic indwelling Foley catheter   . Bladder spasms   . Hyperlipidemia   . GERD (gastroesophageal reflux disease)   . Neurogenic bladder   . CAD (coronary artery disease)   . PONV (postoperative nausea and vomiting)   . CHF (congestive heart failure)   . DVT (deep venous thrombosis) 1980's    LLE  . Type II diabetes mellitus   . Diabetic diarrhea   .  Pneumonia 1999  . OSA (obstructive sleep apnea)     "they wanted me to wear a mask; I couldn't" (11/10/2014)  . Arthritis     "hands, ankles, knees" (11/10/2014)  . Chronic lower back pain   . Stroke 10/2011; 06/2014    right hand numbness/notes 10/20/2011, pt not sure he had stroke in 2013; "right hand weaker and right face not quite right since" (11/10/2014)    Past Surgical History  Procedure Laterality Date  . Gastroplasty    . Total knee arthroplasty Right 1990's  . Left heart catheterization with coronary angiogram N/A 10/24/2011    Procedure: LEFT HEART CATHETERIZATION WITH  CORONARY ANGIOGRAM;  Surgeon: Candee Furbish, MD;  Location: [facility] CATH LAB;  Service: Cardiovascular;  Laterality: N/A;  right radial artery approach  . Cholecystectomy open  1970's?  . Joint replacement    . Carpal tunnel release Bilateral ~ 2003-2004    Family History  Problem Relation Age of Onset  . Hypertension Mother   . Diabetes type I Father   . Cancer Brother      Testicular Cancer  . Cancer Brother     Leukemia    Social History:  reports that he quit smoking about 38 years ago. His smoking use included Cigarettes. He has a 40 pack-year smoking history. He has never used smokeless tobacco. He reports that he drinks alcohol. He reports that he does not use illicit drugs.  Allergies  Allergen Reactions  . Ace Inhibitors Swelling    Pt tolerates lisinopril  . Lipitor [Atorvastatin Calcium] Swelling  . Metformin And Related Swelling  . Cefadroxil Hives  . Cephalexin Hives  . Morphine And Related Other (See Comments)    Sweating, feels like is "in rocky boat."  . Robaxin [Methocarbamol] Other (See Comments)    Feels like he is shaky    MEDICATIONS:                                                                                                                     Prior to Admission:  Prescriptions prior to admission  Medication Sig Dispense Refill Last Dose  . aspirin EC 81 MG tablet Take 81 mg by  mouth every morning.    11/14/2014 at Unknown time  . diphenhydrAMINE (SOMINEX) 25 MG tablet Take 25 mg by mouth at bedtime as needed for sleep.   Past Week at Unknown time  . diphenoxylate-atropine (LOMOTIL) 2.5-0.025 MG per tablet Take 1 tablet by mouth 3 (three) times daily as needed. For diarrhea 90 tablet 0 Past Week at Unknown time  . fexofenadine (ALLEGRA) 180 MG tablet Take 180 mg by mouth at bedtime as needed for allergies.    11/14/2014 at Unknown time  . furosemide (LASIX) 40 MG tablet Take 1 tablet (40 mg total) by mouth daily. 30 tablet 11 11/14/2014 at Unknown time  . HUMULIN 70/30 (70-30) 100 UNIT/ML injection Inject 50 Units into the skin 2 (two) times daily.  12 11/14/2014 at Unknown time  . HYDROcodone-acetaminophen (NORCO/VICODIN) 5-325 MG per tablet Take 1 tablet by mouth 2 (two) times daily as needed for severe pain. 6 tablet 0 11/14/2014 at Unknown time  . isosorbide mononitrate (IMDUR) 30 MG 24 hr tablet Take 1 tablet (30 mg total) by mouth daily.   11/14/2014 at Unknown time  . lisinopril (PRINIVIL,ZESTRIL) 5 MG tablet Take 5 mg by mouth daily.  3 11/14/2014 at Unknown time  . metoprolol tartrate (LOPRESSOR) 25 MG tablet Take 1 tablet (25 mg total) by mouth 2 (two) times daily. 60 tablet 11 11/14/2014 at 0700  . omeprazole (PRILOSEC) 20 MG capsule Take 20 mg by mouth daily.  11/14/2014 at Unknown time  . oxybutynin (DITROPAN) 5 MG tablet Take 5 mg by mouth 3 (three) times daily as needed for bladder spasms.   11/14/2014 at Unknown time  . Sennosides 25 MG TABS Take 25 mg by mouth daily as needed (for constipation).   Past Month at Unknown time  . nitroGLYCERIN (NITROSTAT) 0.4 MG SL tablet Place 0.4 mg under the tongue every 5 (five) minutes as needed for chest pain.   6 months   Scheduled: . aspirin EC  81 mg Oral Daily  . Chlorhexidine Gluconate Cloth  6 each Topical Q0600  . enoxaparin (LOVENOX) injection  70 mg Subcutaneous Q24H  . insulin aspart  0-15 Units Subcutaneous 6 times per day   . insulin aspart protamine- aspart  30 Units Subcutaneous BID WC  . mupirocin ointment  1 application Nasal BID     ROS:                                                                                                                                       History obtained from the patient  General ROS: negative for - chills, fatigue, fever, night sweats, weight gain or weight loss Psychological ROS: negative for - behavioral disorder, hallucinations, memory difficulties, mood swings or suicidal ideation Ophthalmic ROS: negative for - blurry vision, double vision, eye pain or loss of vision ENT ROS: negative for - epistaxis, nasal discharge, oral lesions, sore throat, tinnitus or vertigo Allergy and Immunology ROS: negative for - hives or itchy/watery eyes Hematological and Lymphatic ROS: negative for - bleeding problems, bruising or swollen lymph nodes Endocrine ROS: negative for - galactorrhea, hair pattern changes, polydipsia/polyuria or temperature intolerance Respiratory ROS: negative for - cough, hemoptysis, shortness of breath or wheezing Cardiovascular ROS: negative for - chest pain, dyspnea on exertion, edema or irregular heartbeat Gastrointestinal ROS: negative for - abdominal pain, diarrhea, hematemesis, nausea/vomiting or stool incontinence Genito-Urinary ROS: negative for - dysuria, hematuria, incontinence or urinary frequency/urgency Musculoskeletal ROS: negative for - joint swelling or muscular weakness Neurological ROS: as noted in HPI Dermatological ROS: negative for rash and skin lesion changes     Physical Examination:    Blood pressure 147/81, pulse 72, temperature 98 F (36.7 C), temperature source Oral, resp. rate 16, weight 158.305 kg (349 lb), SpO2 99 %. HEENT-  Normocephalic, no lesions, without obvious abnormality.  Normal external eye and conjunctiva.  Normal TM's bilaterally.  Normal auditory canals and external ears. Normal external nose, mucus membranes and  septum.  Normal pharynx. Cardiovascular- S1, S2 normal, pulses palpable throughout   Lungs- chest clear, no wheezing, rales, normal symmetric air entry Abdomen- normal findings: bowel sounds normal Extremities- no clubbing Lymph-no adenopathy palpable Musculoskeletal-no joint tenderness, deformity or swelling Skin-warm and dry, no hyperpigmentation, vitiligo, or suspicious lesions  Neurological Examination Mental Status: Alert, oriented, thought content appropriate.  Speech fluent without evidence of aphasia.  Able to follow  3 step commands without difficulty. Cranial Nerves: II: Discs flat bilaterally; Visual fields grossly normal, pupils equal, round, reactive to light and accommodation III,IV, VI: ptosis not present, extra-ocular motions intact bilaterally V,VII: smile symmetric, facial light touch sensation normal bilaterally VIII: hearing normal bilaterally IX,X: uvula rises symmetrically XI: bilateral shoulder shrug XII: midline tongue extension Motor: Right : Upper extremity   5/5    Left:     Upper extremity   5/5  Lower extremity   5/5     Lower extremity   5/5 Tone and bulk:normal tone throughout; no atrophy noted Sensory: Pinprick and light touch intact throughout, bilaterally Deep Tendon Reflexes: 2+ and symmetric throughout Plantars: Right: downgoing   Left: downgoing Cerebellar: normal finger-to-nose,  normal heel-to-shin test Gait: not tested due to safety   --through exam he shows multiple functional symptoms and would not give full effort.    Lab Results: Basic Metabolic Panel:  Recent Labs Lab 11/10/14 1302 11/14/14 1900 11/15/14 0527  NA 134* 134* 137  K 3.6 4.2 3.8  CL 98 101 102  CO2 27 29 30   GLUCOSE 255* 231* 181*  BUN <5* 6 <5*  CREATININE 0.71 0.80 0.77  CALCIUM 8.5 8.3* 8.0*  MG  --   --  1.8  PHOS  --   --  3.3    Liver Function Tests:  Recent Labs Lab 11/14/14 1900 11/15/14 0527  AST 20 15  ALT 15 14  ALKPHOS 91 84  BILITOT  0.8 0.7  PROT 6.5 5.6*  ALBUMIN 3.2* 2.9*   No results for input(s): LIPASE, AMYLASE in the last 168 hours. No results for input(s): AMMONIA in the last 168 hours.  CBC:  Recent Labs Lab 11/10/14 1302 11/14/14 1900 11/15/14 0527  WBC 7.6 9.3 7.8  NEUTROABS  --  7.0 5.0  HGB 13.7 13.8 12.9*  HCT 40.2 42.6 38.4*  MCV 87.4 88.0 88.5  PLT 190 202 187    Cardiac Enzymes:  Recent Labs Lab 11/10/14 2130 11/11/14 0445 11/11/14 1013 11/14/14 1900  TROPONINI <0.03 <0.03 <0.03 <0.03    Lipid Panel:  Recent Labs Lab 11/15/14 0116  CHOL 176  TRIG 112  HDL 33*  CHOLHDL 5.3  VLDL 22  LDLCALC 121*    CBG:  Recent Labs Lab 11/15/14 0814 11/15/14 1130 11/15/14 1627 11/15/14 2020 11/16/14 0348  GLUCAP 165* 204* 177* 218* 157*    Microbiology: Results for orders placed or performed during the hospital encounter of 11/14/14  Clostridium Difficile by PCR     Status: None   Collection Time: 11/14/14  8:36 PM  Result Value Ref Range Status   C difficile by pcr NEGATIVE NEGATIVE Final  Culture, Urine     Status: None (Preliminary result)   Collection Time: 11/15/14  5:15 AM  Result Value Ref Range Status   Specimen Description URINE, CATHETERIZED  Final   Special Requests NONE  Final   Colony Count   Final    >=100,000 COLONIES/ML Performed at [facility]    Culture   Final    STAPHYLOCOCCUS AUREUS Note: RIFAMPIN AND GENTAMICIN SHOULD NOT BE USED AS SINGLE DRUGS FOR TREATMENT OF STAPH INFECTIONS. Performed at [facility]    Report Status PENDING  Incomplete    Coagulation Studies: No results for input(s): LABPROT, INR in the last 72 hours.  Imaging: Ct Head Wo Contrast  11/15/2014   CLINICAL DATA:  Syncope and collapse x 2, history of stroke, diabetes, CHF.  EXAM: CT  HEAD WITHOUT CONTRAST  TECHNIQUE: Contiguous axial images were obtained from the base of the skull through the vertex without intravenous contrast.  COMPARISON:  MRI of the  brain August 28, 2014 and CT of the head June 25, 2014  FINDINGS: Mild motion degraded examination.  The ventricles and sulci are normal for age. No intraparenchymal hemorrhage, mass effect nor midline shift. Minimal supratentorial white matter hypodensities are within normal range for patient's age and though non-specific suggest sequelae of chronic small vessel ischemic disease. No acute large vascular territory infarcts. Symmetric basal ganglia and thalamus mineralization.  No abnormal extra-axial fluid collections. Basal cisterns are patent. Mild calcific atherosclerosis of the carotid siphons and included vertebral arteries.  No skull fracture. The included ocular globes and orbital contents are non-suspicious. Trace paranasal sinus mucosal thickening without air-fluid levels. Patient is edentulous.  IMPRESSION: No acute intracranial process, stable from prior imaging.   Electronically Signed   By: Elon Alas   On: 11/15/2014 01:51   Mr Jodene Nam Head Wo Contrast  11/15/2014   CLINICAL DATA:  Two brief syncopal episodes this evening. Intermittent chest discomfort. Seen earlier in emergency department today for accidental Foley catheter removal. History of diabetic neuropathy, stroke with residual RIGHT-sided weakness, heart failure.  EXAM: MRI HEAD WITHOUT CONTRAST  MRA HEAD WITHOUT CONTRAST  TECHNIQUE: Multiplanar, multiecho pulse sequences of the brain and surrounding structures were obtained without intravenous contrast. Angiographic images of the head were obtained using MRA technique without contrast.  COMPARISON:  CT of the head November 15, 2014 at 10:06 a.m. and MRI/ MRA of the brain August 28, 2014  FINDINGS: MRI HEAD FINDINGS (Mild motion degraded examination)  No reduced diffusion to suggest acute ischemia. No susceptibility artifact is suggests hemorrhage ; vein susceptibility artifact in the bilateral basal ganglia and LEFT greater than RIGHT thalamus corresponding to mineralization.  The  ventricles and sulci are normal for patient's age. Punctate T2 hyperintensity in RIGHT cerebellum. Remote LEFT basal ganglia lacunar infarct. Small area of cystic encephalomalacia LEFT posterior median frontal lobe, unchanged. No midline shift, mass effect or mass lesions.  No abnormal extra-axial fluid collections. Ocular globes and orbital contents are unremarkable. Mild paranasal sinus mucosal thickening without air-fluid levels. Mastoid air cells are well aerated. No abnormal sellar expansion. No cerebellar tonsillar ectopia.  MRA HEAD FINDINGS (moderately motion degraded examination)  Anterior circulation: Normal flow related enhancement of the included cervical, petrous, cavernous and supra clinoid internal carotid arteries. Patent anterior communicating artery. Preserved flow related enhancement of the anterior and middle cerebral arteries, including more distal segments.  Posterior circulation: LEFT vertebral artery is dominant. Basilar artery is patent, with normal flow related enhancement of the main branch vessels. Preserved flow related enhancement of the posterior cerebral arteries.  No large vessel occlusion within the anterior or posterior circulation. Limited assessment of the aneurysm, luminal irregularity due to motion.  IMPRESSION: MRI HEAD: Mild motion degraded examination. No acute intracranial process, specifically no acute ischemia.  Remote LEFT basal ganglia lacunar infarct. Small area LEFT mesial posterior frontal lobe encephalomalacia is likely post ischemic.  MRA HEAD: Moderately motion degraded examination without large vessel occlusion.   Electronically Signed   By: Elon Alas   On: 11/15/2014 03:40   Mr Brain Wo Contrast  11/15/2014   CLINICAL DATA:  Two brief syncopal episodes this evening. Intermittent chest discomfort. Seen earlier in emergency department today for accidental Foley catheter removal. History of diabetic neuropathy, stroke with residual RIGHT-sided weakness,  heart failure.  EXAM:  MRI HEAD WITHOUT CONTRAST  MRA HEAD WITHOUT CONTRAST  TECHNIQUE: Multiplanar, multiecho pulse sequences of the brain and surrounding structures were obtained without intravenous contrast. Angiographic images of the head were obtained using MRA technique without contrast.  COMPARISON:  CT of the head November 15, 2014 at 10:06 a.m. and MRI/ MRA of the brain August 28, 2014  FINDINGS: MRI HEAD FINDINGS (Mild motion degraded examination)  No reduced diffusion to suggest acute ischemia. No susceptibility artifact is suggests hemorrhage ; vein susceptibility artifact in the bilateral basal ganglia and LEFT greater than RIGHT thalamus corresponding to mineralization.  The ventricles and sulci are normal for patient's age. Punctate T2 hyperintensity in RIGHT cerebellum. Remote LEFT basal ganglia lacunar infarct. Small area of cystic encephalomalacia LEFT posterior median frontal lobe, unchanged. No midline shift, mass effect or mass lesions.  No abnormal extra-axial fluid collections. Ocular globes and orbital contents are unremarkable. Mild paranasal sinus mucosal thickening without air-fluid levels. Mastoid air cells are well aerated. No abnormal sellar expansion. No cerebellar tonsillar ectopia.  MRA HEAD FINDINGS (moderately motion degraded examination)  Anterior circulation: Normal flow related enhancement of the included cervical, petrous, cavernous and supra clinoid internal carotid arteries. Patent anterior communicating artery. Preserved flow related enhancement of the anterior and middle cerebral arteries, including more distal segments.  Posterior circulation: LEFT vertebral artery is dominant. Basilar artery is patent, with normal flow related enhancement of the main branch vessels. Preserved flow related enhancement of the posterior cerebral arteries.  No large vessel occlusion within the anterior or posterior circulation. Limited assessment of the aneurysm, luminal irregularity due to  motion.  IMPRESSION: MRI HEAD: Mild motion degraded examination. No acute intracranial process, specifically no acute ischemia.  Remote LEFT basal ganglia lacunar infarct. Small area LEFT mesial posterior frontal lobe encephalomalacia is likely post ischemic.  MRA HEAD: Moderately motion degraded examination without large vessel occlusion.   Electronically Signed   By: Elon Alas   On: 11/15/2014 03:40   Dg Chest Port 1 View  11/14/2014   CLINICAL DATA:  Diarrhea.  Loss of consciousness.  EXAM: PORTABLE CHEST - 1 VIEW  COMPARISON:  11/10/2014  FINDINGS: Cardiac enlargement with mild vascular congestion. Negative for edema or effusion. Negative for infiltrate.  IMPRESSION: Cardiac enlargement with vascular congestion.  Negative for edema.   Electronically Signed   By: Franchot Gallo M.D.   On: 11/14/2014 19:40       Assessment and plan per attending neurologist  Etta Quill PA-C [facility] [phone]  11/16/2014, 10:37 AM   Assessment/Plan:  69 YO male presenting with periods for brief loss of consciousness. Patient has had a full work up in October which was unrevealing and EEG in December which was normal. Exam shows many functional components and poor effort. Description of events do not sound epileptiform but more syncope. At this time MRI, and echo have been unrevealing.   Recommend: 1) Orthostatic BP 2) Carotid doppler pending 3) EEG 4) PT 5) continue to treat UTI.      Patient seen and examined together with physician assistant and I concur with the assessment and plan.  Dorian Pod, MD

## 2014-11-16 NOTE — Progress Notes (Signed)
CARE MANAGEMENT NOTE 11/16/2014  Patient:  [name], [name]   Account Number:  [id number]  Date Initiated:  11/16/2014  Documentation initiated by:  [facility]  Subjective/Objective Assessment:   Syncope and collapse     Action/Plan:   HH   Anticipated DC Date:  11/16/2014   Anticipated DC Plan:  [place]  CM consult      [facility] Choice  Resumption Of Svcs/PTA Provider   Choice offered to / List presented to:  C-1 Patient   DME arranged  3-N-1      DME agency  [place] arranged  HH-2 PT  HH-1 RN  [place]      [facility] agency  [place]   Status of service:  Completed, signed off Medicare Important Message given?  YES (If response is "NO", the following Medicare IM given date fields will be blank) Date Medicare IM given:  11/16/2014 Medicare IM given by:  [facility] Date Additional Medicare IM given:   Additional Medicare IM given by:    Discharge Disposition:  [place]  Per UR Regulation:    If discussed at Long Length of Stay Meetings, dates discussed:    Comments:  11/16/2014 1450 Pt is active with [facility] for [facility]. NCM spoke to pt and he has RW, wheelchair and grab bars in bathroom at home. He lives alone but feels he is can manage at home. He has used Med benefits for rehab. Pt requesting 3n1 bedside commode for home. Contacted [facility] for 3n1 bedside commode. Orders in [place] for [facility]. Notified [place]. NCM will continue to follow for dc needs. Jonnie Finner RN CCM Case Mgmt phone [phone]

## 2014-11-16 NOTE — Progress Notes (Signed)
PT Cancellation Note  Patient Details Name: [name] MRN: [id number] DOB: May 21, 1946   Cancelled Treatment:    Reason Eval/Treat Not Completed: Patient declined, no reason specified (pt declined therapy this am. Will reattempt as time allows)   Lanetta Inch Beth 11/16/2014, 8:45 AM Elwyn Reach, [place]

## 2014-11-16 NOTE — Evaluation (Signed)
Occupational Therapy Evaluation Patient Details Name: [name] MRN: [id number] DOB: 11-Apr-1946 Today's Date: 11/16/2014    History of Present Illness 69 y.o. male with PMH significant for frequent syncope events, UTI, chronic indwelling foley catheter, Diabetes, HTN, who presents with diarrhea   Clinical Impression   Pt was assisted for LB sponge bathing and dressing at home. He was able to ambulate with canes or a cane and handrails in his home. Pt with good UE strength. No physical assist for bed mobility or sit to stand with pt using features of the hospital bed and momentum.  Pt with one staring episode when standing, without weakness or LOB, but pt unresponsive to questions for several seconds.  Pt has had extensive OT in rehab centers and is knowledgeable in use of DME and AE, but reports he did not purchase.  No acute OT needs. Will defer further OT to next venue   Follow Up Recommendations  SNF (Home health if unable to return)   Equipment Recommendations       Recommendations for Other Services       Precautions / Restrictions Precautions Precautions: Fall Restrictions Weight Bearing Restrictions: No      Mobility Bed Mobility Overal bed mobility: Needs Assistance Bed Mobility: Supine to Sit;Sit to Supine     Supine to sit: Modified independent (Device/Increase time);HOB elevated (heavy reliance on rail, increased time) Sit to supine: Min assist (assist for LEs)   General bed mobility comments: uses momentum, no physical assist, able to pull himself up in bed with rail   Transfers Overall transfer level: Needs assistance   Transfers: Sit to/from Stand Sit to Stand: Min guard         General transfer comment: Pt used momentum with bed elevated and use of rail.    Balance Overall balance assessment: Needs assistance Sitting-balance support: Feet supported Sitting balance-Leahy Scale: Good     Standing balance support: No upper extremity  supported Standing balance-Leahy Scale: Fair Standing balance comment: Pt with staring episode in standing, but no LOB or weakness noted. RN also witnessed.                            ADL                                         General ADL Comments: Pt performing ADL at his baseline, needs supervision because of staring episodes for safety.     Vision     Perception     Praxis      Pertinent Vitals/Pain Pain Assessment: No/denies pain     Hand Dominance Right   Extremity/Trunk Assessment Upper Extremity Assessment Upper Extremity Assessment: Overall WFL for tasks assessed       Cervical / Trunk Assessment Cervical / Trunk Assessment: Normal   Communication Communication Communication: No difficulties   Cognition Arousal/Alertness: Awake/alert Behavior During Therapy: WFL for tasks assessed/performed Overall Cognitive Status: Within Functional Limits for tasks assessed                     General Comments       Exercises       Shoulder Instructions      Home Living Family/patient expects to be discharged to:: Private residence Living Arrangements: Alone Available Help at Discharge: Friend(s);Available PRN/intermittently;Personal care attendant (has an aide  from [place] x1/week) Type of Home: House Home Access: Stairs to enter [employer] of Steps: 1   Home Layout: One level     Bathroom Shower/Tub: Teacher, early years/pre: Handicapped height     Home Equipment: Cane - single point;Grab bars - toilet;Grab bars - tub/shower;Electric scooter;Wheelchair - [employer] - 2 wheels;Walker - standard   Additional Comments: Pt takes sponge bath at sink. W/c and scooter do not fit inside the house so pt usually uses cane. Pt reports he is too large for his scooter and no longer feels safe using it in the community Reports he has been educated in use of AE for LB ADL in rehab, but did not purchase. Aide  washes his feet and changes socks 1x a week.      Prior Functioning/Environment Level of Independence: Independent with assistive device(s)        Comments: had just discharged from SNF in Feb    OT Diagnosis:     OT Problem List:     OT Treatment/Interventions:      OT Goals(Current goals can be found in the care plan section) Acute Rehab OT Goals Patient Stated Goal: get medical problems resolved  OT Frequency:     Barriers to D/C:            Co-evaluation              End of Session    Activity Tolerance: Treatment limited secondary to medical complications (Comment) (staring episode) Patient left: in bed;with call bell/phone within reach;with nursing/sitter in room   Time: 1120-1145 OT Time Calculation (min): 25 min Charges:  OT General Charges $OT Visit: 1 Procedure OT Evaluation $Initial OT Evaluation Tier I: 1 Procedure OT Treatments $Self Care/Home Management : 8-22 mins G-Codes:    Malka So 11/16/2014, 12:11 PM  [phone]

## 2014-11-16 NOTE — Progress Notes (Signed)
Dr Aram Beecham (neurology) paged through [facility] system about patient staring episode this afternoon. Janyia Guion, Bettina Gavia RN

## 2014-11-16 NOTE — Procedures (Signed)
ELECTROENCEPHALOGRAM REPORT   Patient: [name]       Room #: [id number] EEG No. ID: [id number] Age: 69 y.o.        Sex: male Referring Physician: Wynelle Cleveland Report Date:  11/16/2014        Interpreting Physician: Alexis Goodell  History: [name] is an 69 y.o. male with recurrent syncope  Medications:  Scheduled: . aspirin EC  81 mg Oral Daily  . Chlorhexidine Gluconate Cloth  6 each Topical Q0600  . enoxaparin (LOVENOX) injection  70 mg Subcutaneous Q24H  . insulin aspart  0-15 Units Subcutaneous 6 times per day  . insulin aspart protamine- aspart  30 Units Subcutaneous BID WC  . mupirocin ointment  1 application Nasal BID    Conditions of Recording:  This is a 16 channel EEG carried out with the patient in the awake, drowsy and asleep states.  Description:  The waking background activity consists of a low voltage, symmetrical, fairly well organized, 9 Hz alpha activity, seen from the parieto-occipital and posterior temporal regions.  Low voltage fast activity, poorly organized, is seen anteriorly and is at times superimposed on more posterior regions.  A mixture of theta and alpha rhythms are seen from the central and temporal regions. The patient drowses with slowing to irregular, low voltage theta and beta activity.   The patient goes in to a light sleep with symmetrical sleep spindles, vertex central sharp transients and irregular slow activity.  No epileptiform activity is noted.   Hyperventilation was not performed.  Intermittent photic stimulation was performed but failed to illicit any change in the tracing.    IMPRESSION: Normal electroencephalogram, awake, asleep and with activation procedures. There are no focal lateralizing or epileptiform features.   Alexis Goodell, MD [facility] [phone] 11/16/2014, 5:03 PM

## 2014-11-16 NOTE — Progress Notes (Signed)
RT Note: Pt has refused cpap at this time.  RT will continue to monitor.

## 2014-11-17 ENCOUNTER — Encounter (HOSPITAL_COMMUNITY): Payer: Self-pay | Admitting: Radiology

## 2014-11-17 ENCOUNTER — Inpatient Hospital Stay (HOSPITAL_COMMUNITY): Payer: Medicare Other

## 2014-11-17 DIAGNOSIS — R109 Unspecified abdominal pain: Secondary | ICD-10-CM

## 2014-11-17 LAB — GLUCOSE, CAPILLARY
GLUCOSE-CAPILLARY: 163 mg/dL — AB (ref 70–99)
GLUCOSE-CAPILLARY: 100 mg/dL — AB (ref 70–99)
GLUCOSE-CAPILLARY: 123 mg/dL — AB (ref 70–99)
Glucose-Capillary: 108 mg/dL — ABNORMAL HIGH (ref 70–99)
Glucose-Capillary: 124 mg/dL — ABNORMAL HIGH (ref 70–99)
Glucose-Capillary: 156 mg/dL — ABNORMAL HIGH (ref 70–99)

## 2014-11-17 LAB — URINE CULTURE

## 2014-11-17 MED ORDER — IOHEXOL 300 MG/ML  SOLN
25.0000 mL | INTRAMUSCULAR | Status: AC
  Administered 2014-11-17 (×2): 25 mL via ORAL

## 2014-11-17 MED ORDER — IOHEXOL 300 MG/ML  SOLN
100.0000 mL | Freq: Once | INTRAMUSCULAR | Status: AC | PRN
  Administered 2014-11-17: 100 mL via INTRAVENOUS

## 2014-11-17 MED ORDER — LACOSAMIDE 50 MG PO TABS
100.0000 mg | ORAL_TABLET | Freq: Two times a day (BID) | ORAL | Status: DC
  Administered 2014-11-17 (×2): 100 mg via ORAL
  Filled 2014-11-17 (×2): qty 2

## 2014-11-17 NOTE — Discharge Instructions (Signed)
NO DRIVING

## 2014-11-17 NOTE — Progress Notes (Signed)
Pt refuses to wear CPAP tonight. Pt encouraged to call RT if pt changes mind. No distress noted. 

## 2014-11-17 NOTE — Consult Note (Signed)
[facility] Consult Note  Referring Provider: No ref. provider found Primary Care Physician:  Irven Shelling, MD Primary Gastroenterologist:  Dr.  Laurel Dimmer Complaint: Right lower quadrant abdominal pain and diarrhea HPI: [name] is an 69 y.o. white male  readmitted after an episode of chest pain for problems related to Foley catheter. He is noted to have diarrhea for several days. He also states to me that he has right lower quadrant abdominal pain which is chronic and intermittent usually worsen after eating and it is gone for 2 or 3 years. He's also had chronic tendency toward diarrhea and had an colonoscopy in 2011 showed diverticulosis and biopsies were negative for microscopic colitis. He had a stool C. difficile toxin here which was negative. CT scan of the abdomen and pelvis was unrevealing for any pathology in the right lower quadrant yesterday. 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  Past Medical History  Diagnosis Date  . Obese   . Hypertension   . Diabetic neuropathy, painful   . Chronic indwelling Foley catheter   . Bladder spasms   . Hyperlipidemia   . GERD (gastroesophageal reflux disease)   . Neurogenic bladder   . CAD (coronary artery disease)   . PONV (postoperative nausea and vomiting)   . CHF (congestive heart failure)   . DVT (deep venous thrombosis) 1980's    LLE  . Type II diabetes mellitus   . Diabetic diarrhea   . Pneumonia 1999  . OSA (obstructive sleep apnea)     "they wanted me to wear a mask; I couldn't" (11/10/2014)  . Arthritis     "hands, ankles, knees" (11/10/2014)  . Chronic lower back pain   . Stroke 10/2011; 06/2014    right hand numbness/notes 10/20/2011, pt not sure he had stroke in 2013; "right hand weaker and right face not quite right since" (11/10/2014)    Past Surgical History  Procedure Laterality Date  . Gastroplasty    . Total knee arthroplasty Right 1990's  . Left heart catheterization with coronary  angiogram N/A 10/24/2011    Procedure: LEFT HEART CATHETERIZATION WITH CORONARY ANGIOGRAM;  Surgeon: Candee Furbish, MD;  Location: [facility] CATH LAB;  Service: Cardiovascular;  Laterality: N/A;  right radial artery approach  . Cholecystectomy open  1970's?  . Joint replacement    . Carpal tunnel release Bilateral ~ 2003-2004    Medications Prior to Admission  Medication Sig Dispense Refill  . aspirin EC 81 MG tablet Take 81 mg by mouth every morning.     . diphenhydrAMINE (SOMINEX) 25 MG tablet Take 25 mg by mouth at bedtime as needed for sleep.    . diphenoxylate-atropine (LOMOTIL) 2.5-0.025 MG per tablet Take 1 tablet by mouth 3 (three) times daily as needed. For diarrhea 90 tablet 0  . fexofenadine (ALLEGRA) 180 MG tablet Take 180 mg by mouth at bedtime as needed for allergies.     . furosemide (LASIX) 40 MG tablet Take 1 tablet (40 mg total) by mouth daily. 30 tablet 11  . HUMULIN 70/30 (70-30) 100 UNIT/ML injection Inject 50 Units into the skin 2 (two) times daily.  12  . HYDROcodone-acetaminophen (NORCO/VICODIN) 5-325 MG per tablet Take 1 tablet by mouth 2 (two) times daily as needed for severe pain. 6 tablet 0  . isosorbide mononitrate (IMDUR) 30 MG 24 hr tablet Take 1 tablet (30 mg total) by mouth daily.    Marland Kitchen lisinopril (PRINIVIL,ZESTRIL) 5 MG tablet Take 5 mg by mouth daily.  3  . metoprolol tartrate (LOPRESSOR) 25 MG tablet Take 1 tablet (25 mg total) by mouth 2 (two) times daily. 60 tablet 11  . omeprazole (PRILOSEC) 20 MG capsule Take 20 mg by mouth daily.    Marland Kitchen oxybutynin (DITROPAN) 5 MG tablet Take 5 mg by mouth 3 (three) times daily as needed for bladder spasms.    . Sennosides 25 MG TABS Take 25 mg by mouth daily as needed (for constipation).    . nitroGLYCERIN (NITROSTAT) 0.4 MG SL tablet Place 0.4 mg under the tongue every 5 (five) minutes as needed for chest pain.      Allergies:  Allergies  Allergen Reactions  . Ace Inhibitors Swelling    Pt tolerates lisinopril  . Lipitor  [Atorvastatin Calcium] Swelling  . Metformin And Related Swelling  . Cefadroxil Hives  . Cephalexin Hives  . Morphine And Related Other (See Comments)    Sweating, feels like is "in rocky boat."  . Robaxin [Methocarbamol] Other (See Comments)    Feels like he is shaky    Family History  Problem Relation Age of Onset  . Hypertension Mother   . Diabetes type I Father   . Cancer Brother      Testicular Cancer  . Cancer Brother     Leukemia    Social History:  reports that he quit smoking about 38 years ago. His smoking use included Cigarettes. He has a 40 pack-year smoking history. He has never used smokeless tobacco. He reports that he drinks alcohol. He reports that he does not use illicit drugs.  Review of Systems: negative except as above   Blood pressure 145/60, pulse 70, temperature 97.8 F (36.6 C), temperature source Oral, resp. rate 20, height 6\' 1"  (1.854 m), weight 158.305 kg (349 lb), SpO2 97 %. Head: Normocephalic, without obvious abnormality, atraumatic Neck: no adenopathy, no carotid bruit, no JVD, supple, symmetrical, trachea midline and thyroid not enlarged, symmetric, no tenderness/mass/nodules Resp: clear to auscultation bilaterally Cardio: regular rate and rhythm, S1, S2 normal, no murmur, click, rub or gallop GI: Abdomen soft obese with upper abdominal surgical scar mild right lower quadrant tenderness without mass Extremities: extremities normal, atraumatic, no cyanosis or edema  Results for orders placed or performed during the hospital encounter of 11/14/14 (from the past 48 hour(s))  Glucose, capillary     Status: Abnormal   Collection Time: 11/15/14  8:20 PM  Result Value Ref Range   Glucose-Capillary 218 (H) 70 - 99 mg/dL  Glucose, capillary     Status: Abnormal   Collection Time: 11/16/14 12:10 AM  Result Value Ref Range   Glucose-Capillary 191 (H) 70 - 99 mg/dL  Glucose, capillary     Status: Abnormal   Collection Time: 11/16/14  3:48 AM  Result  Value Ref Range   Glucose-Capillary 157 (H) 70 - 99 mg/dL  Culture, blood (routine x 2)     Status: None (Preliminary result)   Collection Time: 11/16/14  8:30 AM  Result Value Ref Range   Specimen Description BLOOD RIGHT ANTECUBITAL    Special Requests BOTTLES DRAWN AEROBIC AND ANAEROBIC 10CC    Culture             BLOOD CULTURE RECEIVED NO GROWTH TO DATE CULTURE WILL BE HELD FOR 5 DAYS BEFORE ISSUING A FINAL NEGATIVE REPORT Performed at [facility]    Report Status PENDING   Culture, blood (routine x 2)     Status: None (Preliminary result)   Collection Time: 11/16/14  8:35 AM  Result Value Ref Range   Specimen Description BLOOD BLOOD RIGHT FOREARM    Special Requests BOTTLES DRAWN AEROBIC AND ANAEROBIC 10CC    Culture             BLOOD CULTURE RECEIVED NO GROWTH TO DATE CULTURE WILL BE HELD FOR 5 DAYS BEFORE ISSUING A FINAL NEGATIVE REPORT Performed at [facility]    Report Status PENDING   Glucose, capillary     Status: Abnormal   Collection Time: 11/16/14 11:25 AM  Result Value Ref Range   Glucose-Capillary 174 (H) 70 - 99 mg/dL  Glucose, capillary     Status: Abnormal   Collection Time: 11/16/14  4:48 PM  Result Value Ref Range   Glucose-Capillary 240 (H) 70 - 99 mg/dL  Glucose, capillary     Status: Abnormal   Collection Time: 11/16/14  7:56 PM  Result Value Ref Range   Glucose-Capillary 203 (H) 70 - 99 mg/dL  Glucose, capillary     Status: Abnormal   Collection Time: 11/17/14 12:01 AM  Result Value Ref Range   Glucose-Capillary 108 (H) 70 - 99 mg/dL   Comment 1 Notify RN   Glucose, capillary     Status: Abnormal   Collection Time: 11/17/14  4:35 AM  Result Value Ref Range   Glucose-Capillary 124 (H) 70 - 99 mg/dL   Comment 1 Notify RN   Glucose, capillary     Status: Abnormal   Collection Time: 11/17/14  8:25 AM  Result Value Ref Range   Glucose-Capillary 163 (H) 70 - 99 mg/dL   Comment 1 Notify RN   Glucose, capillary     Status: Abnormal    Collection Time: 11/17/14 11:26 AM  Result Value Ref Range   Glucose-Capillary 156 (H) 70 - 99 mg/dL   Comment 1 Notify RN   Glucose, capillary     Status: Abnormal   Collection Time: 11/17/14  3:41 PM  Result Value Ref Range   Glucose-Capillary 100 (H) 70 - 99 mg/dL   Comment 1 Notify RN    Ct Abdomen Pelvis W Contrast  11/17/2014   CLINICAL DATA:  Diarrhea and recent syncopal of the with right-sided abdominal pain  EXAM: CT ABDOMEN AND PELVIS WITH CONTRAST  TECHNIQUE: Multidetector CT imaging of the abdomen and pelvis was performed using the standard protocol following bolus administration of intravenous contrast.  CONTRAST:  138mL OMNIPAQUE IOHEXOL 300 MG/ML  SOLN  COMPARISON:  06/25/2014  FINDINGS: Lung bases demonstrate small right-sided pleural effusion which is new from the prior exam.  The liver, spleen, adrenal glands and pancreas are stable in appearance from prior exam. 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 The gallbladder has been surgically removed. The kidneys are well visualized and demonstrates some cystic change. No obstructive changes or renal calculi are noted. Retro aortic left renal vein is seen.  The appendix is within normal limits. Mild diverticular change of the colon is noted. The bladder is well distended. Some air is noted within the bladder likely related to instrumentation. A Foley catheter is seen although the balloon appears to be lodged within the prostate. This should be deflated and advanced and reinflated. The osseous structures show no acute abnormality. Postsurgical changes are noted in the upper abdomen.  IMPRESSION: Foley catheter balloon appears to lie within the prostate. This should be deflated fat and advanced. Air is noted within the bladder likely related instrumentation and the catheter placement.  Small right-sided pleural effusion.  The remainder  of the exam is stable from the prior study.   Electronically Signed   By: Inez Catalina M.D.   On: 11/17/2014  14:42    Assessment: Right lower quadrant abdominal pain chronic intermittent and stable according to the patient approximate 2 years duration no obvious pathology to explain it on CT scan. Diarrhea acute workup in hospital unrevealing Plan:  Will get tissue transglutaminase to rule out celiac disease. If negative Consider an anti-spasmodic for possible visceral gut pain and hypermotility if not contraindicated by his urological issues. Could also try empiric therapy such as Carafate or Questran Lionel Woodberry C 11/17/2014, 4:55 PM

## 2014-11-17 NOTE — Consult Note (Signed)
NEURO HOSPITALIST PROGRESS NOTE   SUBJECTIVE:                                                                                                                        No neurological complains this morning. Yesterday he had another spell of short lasting impaired responsiveness while standing up without premonitory cardiac symptoms (orthostathics were not checked). EEG normal. Previous MRI, ECHO unrevealing. OBJECTIVE:                                                                                                                           Vital signs in last 24 hours: Temp:  [97.9 F (36.6 C)-98.2 F (36.8 C)] 97.9 F (36.6 C) (03/09 0455) Pulse Rate:  [66-79] 66 (03/09 0455) Resp:  [18] 18 (03/09 0455) BP: (136-144)/(61-67) 144/61 mmHg (03/09 0455) SpO2:  [97 %-99 %] 97 % (03/09 0455) Weight:  [158.305 kg (349 lb)] 158.305 kg (349 lb) (03/09 0455)  Intake/Output from previous day: 03/08 0701 - 03/09 0700 In: 902 [P.O.:902] Out: 4500 [Urine:4500] Intake/Output this shift:   Nutritional status: Diet heart healthy/carb modified  Past Medical History  Diagnosis Date  . Obese   . Hypertension   . Diabetic neuropathy, painful   . Chronic indwelling Foley catheter   . Bladder spasms   . Hyperlipidemia   . GERD (gastroesophageal reflux disease)   . Neurogenic bladder   . CAD (coronary artery disease)   . PONV (postoperative nausea and vomiting)   . CHF (congestive heart failure)   . DVT (deep venous thrombosis) 1980's    LLE  . Type II diabetes mellitus   . Diabetic diarrhea   . Pneumonia 1999  . OSA (obstructive sleep apnea)     "they wanted me to wear a mask; I couldn't" (11/10/2014)  . Arthritis     "hands, ankles, knees" (11/10/2014)  . Chronic lower back pain   . Stroke 10/2011; 06/2014    right hand numbness/notes 10/20/2011, pt not sure he had stroke in 2013; "right hand weaker and right face not quite right since" (11/10/2014)   Physical Examination: HEENT- Normocephalic, no lesions, without obvious abnormality. Normal external eye and conjunctiva. Normal TM's bilaterally. Normal auditory canals and external ears. Normal external  nose, mucus membranes and septum. Normal pharynx. Cardiovascular- S1, S2 normal, pulses palpable throughout  Lungs- chest clear, no wheezing, rales, normal symmetric air entry Abdomen- normal findings: bowel sounds normal Extremities- no clubbing Lymph-no adenopathy palpable Musculoskeletal-no joint tenderness, deformity or swelling Skin-warm and dry, no hyperpigmentation, vitiligo, or suspicious lesions  Neurologic Exam:  Mental Status: Alert, oriented, thought content appropriate. Speech fluent without evidence of aphasia. Able to follow 3 step commands without difficulty. Cranial Nerves: II: Discs flat bilaterally; Visual fields grossly normal, pupils equal, round, reactive to light and accommodation III,IV, VI: ptosis not present, extra-ocular motions intact bilaterally V,VII: smile symmetric, facial light touch sensation normal bilaterally VIII: hearing normal bilaterally IX,X: uvula rises symmetrically XI: bilateral shoulder shrug XII: midline tongue extension Motor: Right :Upper extremity 5/5Left: Upper extremity 5/5 Lower extremity 5/5Lower extremity 5/5 Tone and bulk:normal tone throughout; no atrophy noted Sensory: Pinprick and light touch intact throughout, bilaterally Deep Tendon Reflexes: 2+ and symmetric throughout Plantars: Right: downgoingLeft: downgoing Cerebellar: normal finger-to-nose, normal heel-to-shin test Gait: not tested due to safety   Lab Results: Lab Results  Component Value Date/Time   CHOL 176 11/15/2014 01:16 AM   Lipid Panel  Recent Labs  11/15/14 0116  CHOL 176  TRIG 112  HDL 33*   CHOLHDL 5.3  VLDL 22  LDLCALC 121*    Studies/Results: No results found.  MEDICATIONS                                                                                                                        Scheduled: . aspirin EC  81 mg Oral Daily  . Chlorhexidine Gluconate Cloth  6 each Topical Q0600  . enoxaparin (LOVENOX) injection  70 mg Subcutaneous Q24H  . insulin aspart  0-15 Units Subcutaneous 6 times per day  . insulin aspart protamine- aspart  30 Units Subcutaneous BID WC  . mupirocin ointment  1 application Nasal BID    ASSESSMENT/PLAN:                                                                                                            69 YO male presenting with recurrent paroxysmal spells of brief loss of consciousness. Patient has had a full work up in October which was unrevealing and EEG in December which was normal. Those tests are unremarkable again. Patient spells typically occur when he stands up. As far as I know, patient has no risk factors for epilepsy. He will need a tilt table test and ambulatory 24 hour EEG as outpatient and  potential admission to an epilepsy monitoring unit for spells clarification if his events become more frequent. In the meantime, we can consider an empiric trial of vimpat pending results of further spells versus seizures work up. Neurology will sign off.  Dorian Pod, MD [facility] [phone]  11/17/2014, 9:11 AM

## 2014-11-17 NOTE — Progress Notes (Signed)
[facility] Progress Note   [name] [id number] DOB: [birth year] DOA: 11/14/2014 PCP: Irven Shelling, MD  Brief narrative: [name] is a 69 y.o. male Patient was discharged from the hospital on 3/3 after being admitted for chest pain which was determined to be non-cardiac and most likely GI related. Patient came to the ER earlier yesterday as he stepped on his foley and needed it to be re-adjusted. He went home in stable condition. Patient returned to the ER later in the day- was brought in by EMS for diarrhea-had 2 witness syncopal episodes en route. C/o diarrhea for days- innumerable amount yesterday. On exam has RUQ and RLQ tenderness.    Subjective: Demanded to have a solid diet yesterday and after eating it, RLQ pain has recurred- he is now in agreement for a CT of the abdomen. No other complaints- noted near syncopal spell yesterday   Assessment/Plan: Principal Problem:   Syncope and collapse - ECHO and MRI unrevealing- refusing orthostatics - have asked for Neuro eval to r/o seizures- they are giving him a trial of Vimpat and recommend 24 hr ambulatory EEG - Neuro has signed off and recommends f/u with outpt neuro - carotid duplex results below- discussed with Neuro- no further work up needed  Active Problems: Diarrhea- right sided abdominal tenderness - he refused CT abd/pelvis on 3/7 - refused to stay NPO- demanded solid food which I have started-abdominal tenderness recurred- agreeable to have CT done today - C diff, stool culture neg, fecal lactoferrin negatvie  Chest pain at rest -this is not a new complaint- he had this on his prior admission as well- was suspected to be GI related as it resolved after a GI cocktail - cardiac enzymes x 3 were negative at that time - no troponin ordered at this admission  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-- previous culture from last admission revealed > 100,000 multiple morphotypes - now growing MRSA-   Likely colonization-  check blood cultures x 2   HTN - resumed Metoprolol and Lisinopril which were on hold due to hypotension    DM (diabetes mellitus) with complications - 123456 9.1 - takes 70/30 50 U BID- cont 30 BID - sugars well controlled on this    OSA (obstructive sleep apnea) - CPAP at bedtime   Obesity- BMI 49   Chronic diastolic CHF (congestive heart failure) - not on diuretics at home - compensated  Non compliance - refusing multiple treatments and tests - very doubtful that the takes his medications properly at home-  - unfortunately he has no SNF days left and will need to go home alone which makes him a very high risk for re-admission - will order HHRN/ PT to help prevent re- admission    Code Status: full code Family Communication:  Disposition Plan: home with [facility] DVT prophylaxis: Lovenox Consultants:Neuro Procedures:Mild heterogeneous plaque bilaterally. 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  RT ICA/CCA ratio: 0.98 1-39% stenosis.  LT ICA/CCA ratio: 1.48 1-39% stenosis. (upper limits of 1-39% range)   Antibiotics: Anti-infectives    None      Objective: Filed Weights   11/15/14 0355 11/16/14 0338 11/17/14 0455  Weight: 160.1 kg (352 lb 15.3 oz) 158.305 kg (349 lb) 158.305 kg (349 lb)    Intake/Output Summary (Last 24 hours) at 11/17/14 1125 Last data filed at 11/17/14 0841  Gross per 24 hour  Intake    682 ml  Output   4000 ml  Net  -3318 ml  Vitals Filed Vitals:   11/16/14 0338 11/16/14 1352 11/16/14 2001 11/17/14 0455  BP: 147/81 142/65 136/67 144/61  Pulse: 72 79 75 66  Temp: 98 F (36.7 C) 98.1 F (36.7 C) 98.2 F (36.8 C) 97.9 F (36.6 C)  TempSrc: Oral Oral Oral Oral  Resp: 16 18 18 18   Height:    6\' 1"  (1.854 m)  Weight: 158.305 kg (349 lb)   158.305 kg (349 lb)  SpO2: 99% 97% 99% 97%    Exam:  General:  Pt is alert, not in acute distress  HEENT: No  icterus, No thrush  Cardiovascular: regular rate and rhythm, S1/S2 No murmur  Respiratory: clear to auscultation bilaterally   Abdomen: Soft, +Bowel sounds, RLQ tenderness, non distended, no guarding  MSK: No LE edema, cyanosis or clubbing  Data Reviewed: Basic Metabolic Panel:  Recent Labs Lab 11/10/14 1302 11/14/14 1900 11/15/14 0527  NA 134* 134* 137  K 3.6 4.2 3.8  CL 98 101 102  CO2 27 29 30   GLUCOSE 255* 231* 181*  BUN <5* 6 <5*  CREATININE 0.71 0.80 0.77  CALCIUM 8.5 8.3* 8.0*  MG  --   --  1.8  PHOS  --   --  3.3   Liver Function Tests:  Recent Labs Lab 11/14/14 1900 11/15/14 0527  AST 20 15  ALT 15 14  ALKPHOS 91 84  BILITOT 0.8 0.7  PROT 6.5 5.6*  ALBUMIN 3.2* 2.9*   No results for input(s): LIPASE, AMYLASE in the last 168 hours. No results for input(s): AMMONIA in the last 168 hours. CBC:  Recent Labs Lab 11/10/14 1302 11/14/14 1900 11/15/14 0527  WBC 7.6 9.3 7.8  NEUTROABS  --  7.0 5.0  HGB 13.7 13.8 12.9*  HCT 40.2 42.6 38.4*  MCV 87.4 88.0 88.5  PLT 190 202 187   Cardiac Enzymes:  Recent Labs Lab 11/10/14 2130 11/11/14 0445 11/11/14 1013 11/14/14 1900  TROPONINI <0.03 <0.03 <0.03 <0.03   BNP (last 3 results)  Recent Labs  09/14/14 0644  BNP 95.8    ProBNP (last 3 results)  Recent Labs  03/21/14 1551 04/01/14 1431 08/27/14 1548  PROBNP 2003.0* 658.6* 1121.0*    CBG:  Recent Labs Lab 11/16/14 1648 11/16/14 1956 11/17/14 0001 11/17/14 0435 11/17/14 0825  GLUCAP 240* 203* 108* 124* 163*    Recent Results (from the past 240 hour(s))  Urine culture     Status: None   Collection Time: 11/10/14  1:34 PM  Result Value Ref Range Status   Specimen Description URINE, CLEAN CATCH  Final   Special Requests NONE  Final   Colony Count   Final    >=100,000 COLONIES/ML Performed at [facility]    Culture   Final    Multiple bacterial morphotypes present, none predominant. Suggest appropriate recollection  if clinically indicated. Performed at [facility]    Report Status 11/11/2014 FINAL  Final  MRSA PCR Screening     Status: Abnormal   Collection Time: 11/10/14 11:44 PM  Result Value Ref Range Status   MRSA by PCR POSITIVE (A) NEGATIVE Final    Comment:        The GeneXpert MRSA Assay (FDA approved for NASAL specimens only), is one component of a comprehensive MRSA colonization surveillance program. It is not intended to diagnose MRSA infection nor to guide or monitor treatment for MRSA infections. RESULT CALLED TO, READ BACK BY AND VERIFIED WITH: R.[facility] 0302 11/11/14 M.CAMPBELL   Clostridium Difficile by  PCR     Status: None   Collection Time: 11/14/14  8:36 PM  Result Value Ref Range Status   C difficile by pcr NEGATIVE NEGATIVE Final  Stool culture     Status: None (Preliminary result)   Collection Time: 11/14/14  8:36 PM  Result Value Ref Range Status   Specimen Description STOOL  Final   Special Requests NONE  Final   Culture   Final    NO SUSPICIOUS COLONIES, CONTINUING TO HOLD Performed at [facility]    Report Status PENDING  Incomplete  Culture, Urine     Status: None (Preliminary result)   Collection Time: 11/15/14  5:15 AM  Result Value Ref Range Status   Specimen Description URINE, CATHETERIZED  Final   Special Requests NONE  Final   Colony Count   Final    >=100,000 COLONIES/ML Performed at [facility]    Culture   Final    STAPHYLOCOCCUS AUREUS Note: RIFAMPIN AND GENTAMICIN SHOULD NOT BE USED AS SINGLE DRUGS FOR TREATMENT OF STAPH INFECTIONS. Performed at [facility]    Report Status PENDING  Incomplete  Culture, blood (routine x 2)     Status: None (Preliminary result)   Collection Time: 11/16/14  8:30 AM  Result Value Ref Range Status   Specimen Description BLOOD RIGHT ANTECUBITAL  Final   Special Requests BOTTLES DRAWN AEROBIC AND ANAEROBIC 10CC  Final   Culture   Final           BLOOD CULTURE RECEIVED  NO GROWTH TO DATE CULTURE WILL BE HELD FOR 5 DAYS BEFORE ISSUING A FINAL NEGATIVE REPORT Performed at [facility]    Report Status PENDING  Incomplete  Culture, blood (routine x 2)     Status: None (Preliminary result)   Collection Time: 11/16/14  8:35 AM  Result Value Ref Range Status   Specimen Description BLOOD BLOOD RIGHT FOREARM  Final   Special Requests BOTTLES DRAWN AEROBIC AND ANAEROBIC 10CC  Final   Culture   Final           BLOOD CULTURE RECEIVED NO GROWTH TO DATE CULTURE WILL BE HELD FOR 5 DAYS BEFORE ISSUING A FINAL NEGATIVE REPORT Performed at [facility]    Report Status PENDING  Incomplete     Studies:  Recent x-ray studies have been reviewed in detail by the Attending Physician  Scheduled Meds:  Scheduled Meds: . aspirin EC  81 mg Oral Daily  . Chlorhexidine Gluconate Cloth  6 each Topical Q0600  . enoxaparin (LOVENOX) injection  70 mg Subcutaneous Q24H  . insulin aspart  0-15 Units Subcutaneous 6 times per day  . insulin aspart protamine- aspart  30 Units Subcutaneous BID WC  . lacosamide  100 mg Oral BID  . mupirocin ointment  1 application Nasal BID   Continuous Infusions:    Time spent on care of this patient: > 35 min   [place], MD 11/17/2014, 11:25 AM  LOS: 2 days   [facility] Office  [phone] Pager - Text Page per [email or web address]  If 7PM-7AM, please contact night-coverage [email or web address]

## 2014-11-18 DIAGNOSIS — R404 Transient alteration of awareness: Secondary | ICD-10-CM

## 2014-11-18 LAB — CBC
HEMATOCRIT: 43 % (ref 39.0–52.0)
Hemoglobin: 14.5 g/dL (ref 13.0–17.0)
MCH: 30.2 pg (ref 26.0–34.0)
MCHC: 33.7 g/dL (ref 30.0–36.0)
MCV: 89.6 fL (ref 78.0–100.0)
PLATELETS: 213 10*3/uL (ref 150–400)
RBC: 4.8 MIL/uL (ref 4.22–5.81)
RDW: 13.8 % (ref 11.5–15.5)
WBC: 6.9 10*3/uL (ref 4.0–10.5)

## 2014-11-18 LAB — BASIC METABOLIC PANEL
Anion gap: 4 — ABNORMAL LOW (ref 5–15)
BUN: <5 mg/dL — ABNORMAL LOW (ref 6–23)
CHLORIDE: 102 mmol/L (ref 96–112)
CO2: 32 mmol/L (ref 19–32)
Calcium: 8.5 mg/dL (ref 8.4–10.5)
Creatinine, Ser: 0.72 mg/dL (ref 0.50–1.35)
GFR calc Af Amer: >90 mL/min (ref >90)
GFR calc non Af Amer: >90 mL/min (ref >90)
Glucose, Bld: 114 mg/dL — ABNORMAL HIGH (ref 70–99)
Potassium: 3.6 mmol/L (ref 3.5–5.1)
SODIUM: 138 mmol/L (ref 135–145)

## 2014-11-18 LAB — GLUCOSE, CAPILLARY
GLUCOSE-CAPILLARY: 185 mg/dL — AB (ref 70–99)
GLUCOSE-CAPILLARY: 146 mg/dL — AB (ref 70–99)
Glucose-Capillary: 128 mg/dL — ABNORMAL HIGH (ref 70–99)
Glucose-Capillary: 132 mg/dL — ABNORMAL HIGH (ref 70–99)
Glucose-Capillary: 81 mg/dL (ref 70–99)
Glucose-Capillary: 91 mg/dL (ref 70–99)

## 2014-11-18 MED ORDER — DICYCLOMINE HCL 10 MG PO CAPS
10.0000 mg | ORAL_CAPSULE | Freq: Three times a day (TID) | ORAL | Status: DC
  Administered 2014-11-18 – 2014-11-19 (×5): 10 mg via ORAL
  Filled 2014-11-18 (×7): qty 1

## 2014-11-18 MED ORDER — CALCIUM CARBONATE ANTACID 500 MG PO CHEW
1.0000 | CHEWABLE_TABLET | Freq: Three times a day (TID) | ORAL | Status: DC | PRN
  Administered 2014-11-18: 200 mg via ORAL
  Filled 2014-11-18 (×2): qty 1

## 2014-11-18 MED ORDER — LACOSAMIDE 50 MG PO TABS
150.0000 mg | ORAL_TABLET | Freq: Two times a day (BID) | ORAL | Status: DC
  Administered 2014-11-18 – 2014-11-19 (×2): 150 mg via ORAL
  Filled 2014-11-18 (×2): qty 3

## 2014-11-18 NOTE — Progress Notes (Addendum)
[facility] Progress Note   ZADE NORDHOFF [id number] DOB: Jan 01, 1946 DOA: 11/14/2014 PCP: Irven Shelling, MD  Brief narrative: Javale Athans Cockrell is a 69 y.o. male Patient was discharged from the hospital on 3/3 after being admitted for chest pain which was determined to be non-cardiac and most likely GI related. Patient came to the ER earlier yesterday as he stepped on his foley and needed it to be re-adjusted. He went home in stable condition. Patient returned to the ER later in the day- was brought in by EMS for diarrhea-had 2 witness syncopal episodes en route. C/o diarrhea for days- innumerable amount yesterday. On exam has RUQ and RLQ tenderness.    Subjective: CT abd/pelvis revealed foley balloon to be in prostate yesterday. When we discussed correcting it, he demanded that an 18 fr foley be placed yesterday instead of his 16 fr- now leaking around the foley- explained to patient that he will need to go back to 16 fr. Tolerating diet but with mild RLQ pain- agreeable to try Bentyl. Had another spell of phasing out when stood up for orthostatics- orthostatics were negative and he ambulated with PT but had a second spell about 18 ft into his walk- was then able to walk 150 ft.   Assessment/Plan: Principal Problem:   Unresponsive spells - ECHO and MRI unrevealing-  orthostatics negative today - carotid duplex results below- discussed with Neuro- no further work up needed - have asked for Neuro eval to r/o seizures- they are giving him a trial of Vimpat and recommend 24 hr ambulatory EEG - - spoke with Neuro again today - recommended to increase Vimpat to 150 BID which I have done today   Active Problems: Diarrhea- right sided abdominal tenderness - he refused CT abd/pelvis on 3/7 - refused to stay NPO- demanded solid food which I have started-abdominal tenderness recurred- agreeable to have CT which was unrevealing- GI consulted- per GI, this is a chronic issue, they  ordered TTG for Celiac dxand recommend and anti-spasmodic for the pain and Carafate and Questran if diarrhea recurs (which has not) - C diff, stool culture neg, fecal lactoferrin negatvie  Chest pain at rest -this is not a new complaint- he had this on his prior admission as well- was suspected to be GI related as it resolved after a GI cocktail - cardiac enzymes x 3 were negative at that time - no troponin ordered at this admission  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-- previous culture from last admission revealed > 100,000 multiple morphotypes - now growing MRSA-  Likely colonization-  check blood cultures x 2   HTN - resumed Metoprolol and Lisinopril which were on hold due to hypotension   DM (diabetes mellitus) with complications - 123456 9.1 - takes 70/30 50 U BID- cont 30 BID - sugars well controlled on this  OSA (obstructive sleep apnea) - CPAP at bedtime   Obesity- BMI 49   Chronic diastolic CHF (congestive heart failure) - not on diuretics at home - compensated  Non compliance - refusing multiple treatments and tests - very doubtful that the takes his medications properly at home-  - unfortunately he has no SNF days left and will need to go home alone which makes him a very high risk for re-admission - will order HHRN/ PT to help prevent re- admission  Code Status: full code Family Communication:  Disposition Plan: home with [facility] DVT prophylaxis: Lovenox Consultants:Neuro Procedures:Mild heterogeneous plaque bilaterally. 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  RT  ICA/CCA ratio: 0.98 1-39% stenosis.  LT ICA/CCA ratio: 1.48 1-39% stenosis. (upper limits of 1-39% range)   Antibiotics: Anti-infectives    None      Objective: Filed Weights   11/16/14 0338 11/17/14 0455 11/18/14 0414  Weight: 158.305 kg (349 lb) 158.305 kg (349 lb) 155.3 kg (342 lb 6 oz)    Intake/Output Summary (Last 24 hours) at  11/18/14 1107 Last data filed at 11/18/14 0831  Gross per 24 hour  Intake    730 ml  Output   4750 ml  Net  -4020 ml     Vitals Filed Vitals:   11/18/14 0859 11/18/14 0900 11/18/14 0905 11/18/14 1031  BP:    165/95  Pulse:  125 90 95  Temp:      TempSrc:      Resp:      Height:      Weight:      SpO2: 96%   99%    Exam:  General:  Pt is alert, not in acute distress  HEENT: No icterus, No thrush  Cardiovascular: regular rate and rhythm, S1/S2 No murmur  Respiratory: clear to auscultation bilaterally   Abdomen: Soft, +Bowel sounds, RLQ tenderness, non distended, no guarding  MSK: No LE edema, cyanosis or clubbing  Data Reviewed: Basic Metabolic Panel:  Recent Labs Lab 11/14/14 1900 11/15/14 0527 11/18/14 0553  NA 134* 137 138  K 4.2 3.8 3.6  CL 101 102 102  CO2 29 30 32  GLUCOSE 231* 181* 114*  BUN 6 <5* <5*  CREATININE 0.80 0.77 0.72  CALCIUM 8.3* 8.0* 8.5  MG  --  1.8  --   PHOS  --  3.3  --    Liver Function Tests:  Recent Labs Lab 11/14/14 1900 11/15/14 0527  AST 20 15  ALT 15 14  ALKPHOS 91 84  BILITOT 0.8 0.7  PROT 6.5 5.6*  ALBUMIN 3.2* 2.9*   No results for input(s): LIPASE, AMYLASE in the last 168 hours. No results for input(s): AMMONIA in the last 168 hours. CBC:  Recent Labs Lab 11/14/14 1900 11/15/14 0527 11/18/14 0553  WBC 9.3 7.8 6.9  NEUTROABS 7.0 5.0  --   HGB 13.8 12.9* 14.5  HCT 42.6 38.4* 43.0  MCV 88.0 88.5 89.6  PLT 202 187 213   Cardiac Enzymes:  Recent Labs Lab 11/14/14 1900  TROPONINI <0.03   BNP (last 3 results)  Recent Labs  09/14/14 0644  BNP 95.8    ProBNP (last 3 results)  Recent Labs  03/21/14 1551 04/01/14 1431 08/27/14 1548  PROBNP 2003.0* 658.6* 1121.0*    CBG:  Recent Labs Lab 11/17/14 1126 11/17/14 1541 11/17/14 2018 11/18/14 0020 11/18/14 0410  GLUCAP 156* 100* 123* 91 81    Recent Results (from the past 240 hour(s))  Urine culture     Status: None   Collection  Time: 11/10/14  1:34 PM  Result Value Ref Range Status   Specimen Description URINE, CLEAN CATCH  Final   Special Requests NONE  Final   Colony Count   Final    >=100,000 COLONIES/ML Performed at [facility]    Culture   Final    Multiple bacterial morphotypes present, none predominant. Suggest appropriate recollection if clinically indicated. Performed at [facility]    Report Status 11/11/2014 FINAL  Final  MRSA PCR Screening     Status: Abnormal   Collection Time: 11/10/14 11:44 PM  Result Value Ref Range Status   MRSA by  PCR POSITIVE (A) NEGATIVE Final    Comment:        The GeneXpert MRSA Assay (FDA approved for NASAL specimens only), is one component of a comprehensive MRSA colonization surveillance program. It is not intended to diagnose MRSA infection nor to guide or monitor treatment for MRSA infections. RESULT CALLED TO, READ BACK BY AND VERIFIED WITH: R.[facility] 0302 11/11/14 M.CAMPBELL   Clostridium Difficile by PCR     Status: None   Collection Time: 11/14/14  8:36 PM  Result Value Ref Range Status   C difficile by pcr NEGATIVE NEGATIVE Final  Stool culture     Status: None (Preliminary result)   Collection Time: 11/14/14  8:36 PM  Result Value Ref Range Status   Specimen Description STOOL  Final   Special Requests NONE  Final   Culture   Final    NO SUSPICIOUS COLONIES, CONTINUING TO HOLD Performed at [facility]    Report Status PENDING  Incomplete  Culture, Urine     Status: None   Collection Time: 11/15/14  5:15 AM  Result Value Ref Range Status   Specimen Description URINE, CATHETERIZED  Final   Special Requests NONE  Final   Colony Count   Final    >=100,000 COLONIES/ML Performed at [facility]    Culture   Final    METHICILLIN RESISTANT STAPHYLOCOCCUS AUREUS Note: RIFAMPIN AND GENTAMICIN SHOULD NOT BE USED AS SINGLE DRUGS FOR TREATMENT OF STAPH INFECTIONS. CRITICAL RESULT CALLED TO, READ BACK BY AND  VERIFIED WITH: DINA 3/9 AT 81 BY DUNNJ Performed at [facility]    Report Status 11/17/2014 FINAL  Final   Organism ID, Bacteria METHICILLIN RESISTANT STAPHYLOCOCCUS AUREUS  Final      Susceptibility   Methicillin resistant staphylococcus aureus - MIC*    GENTAMICIN <=0.5 SENSITIVE Sensitive     LEVOFLOXACIN >=8 RESISTANT Resistant     NITROFURANTOIN <=16 SENSITIVE Sensitive     OXACILLIN >=4 RESISTANT Resistant     PENICILLIN >=0.5 RESISTANT Resistant     RIFAMPIN <=0.5 SENSITIVE Sensitive     TRIMETH/SULFA >=320 RESISTANT Resistant     VANCOMYCIN 1 SENSITIVE Sensitive     TETRACYCLINE <=1 SENSITIVE Sensitive     * METHICILLIN RESISTANT STAPHYLOCOCCUS AUREUS  Culture, blood (routine x 2)     Status: None (Preliminary result)   Collection Time: 11/16/14  8:30 AM  Result Value Ref Range Status   Specimen Description BLOOD RIGHT ANTECUBITAL  Final   Special Requests BOTTLES DRAWN AEROBIC AND ANAEROBIC 10CC  Final   Culture   Final           BLOOD CULTURE RECEIVED NO GROWTH TO DATE CULTURE WILL BE HELD FOR 5 DAYS BEFORE ISSUING A FINAL NEGATIVE REPORT Performed at [facility]    Report Status PENDING  Incomplete  Culture, blood (routine x 2)     Status: None (Preliminary result)   Collection Time: 11/16/14  8:35 AM  Result Value Ref Range Status   Specimen Description BLOOD BLOOD RIGHT FOREARM  Final   Special Requests BOTTLES DRAWN AEROBIC AND ANAEROBIC 10CC  Final   Culture   Final           BLOOD CULTURE RECEIVED NO GROWTH TO DATE CULTURE WILL BE HELD FOR 5 DAYS BEFORE ISSUING A FINAL NEGATIVE REPORT Performed at [facility]    Report Status PENDING  Incomplete     Studies:  Recent x-ray studies have been reviewed in detail  by the Attending Physician  Scheduled Meds:  Scheduled Meds: . aspirin EC  81 mg Oral Daily  . Chlorhexidine Gluconate Cloth  6 each Topical Q0600  . dicyclomine  10 mg Oral TID AC  . enoxaparin (LOVENOX) injection  70  mg Subcutaneous Q24H  . insulin aspart  0-15 Units Subcutaneous 6 times per day  . insulin aspart protamine- aspart  30 Units Subcutaneous BID WC  . lacosamide  150 mg Oral BID  . mupirocin ointment  1 application Nasal BID   Continuous Infusions:    Time spent on care of this patient: > 35 min   [place], MD 11/18/2014, 11:07 AM  LOS: 3 days   [facility] Office  [phone] Pager - Text Page per [email or web address]  If 7PM-7AM, please contact night-coverage [email or web address]

## 2014-11-18 NOTE — Progress Notes (Addendum)
Attempted to obtain orthostatic VS and pt was unable to stand for standing BP as pt had syncopal episode.  Pt was unable to answer questions and was staring down at feet then proceeded to sit back down.  Pt assisted to sit and as soon as pt was seated, he was able to respond to questions and was coherent. VSS obtained and documented. . PT arrived a couple of mins later and pt was able to stand for standing VS. See Flowsheet. MD aware

## 2014-11-18 NOTE — Progress Notes (Signed)
Pt refuses to wear CPAP tonight. Pt encouraged to call RT if pt changes mind. No distress noted. 

## 2014-11-18 NOTE — Progress Notes (Signed)
Physical Therapy Treatment Patient Details Name: [name] MRN: [id number] DOB: 1946-08-09 Today's Date: 11/18/2014    History of Present Illness 69 y.o. male with PMH significant for frequent syncope events, UTI, chronic indwelling foley catheter, Diabetes, HTN, who presents with diarrhea    PT Comments    Pt. Progressing with activity able to stand and ambulate today. Pt incontinent of stool on initial stand to bed, unaware with transfer to [facility] and total assist for pericare in standing. Pt with staring episode 2-3 seconds with initial standing trial, again with first walking attempt and a 3 time with return to sitting EOB. No LOB, fall or knee buckling with any of these instances. Pt maintains eyes open with stare with quick return to speaking and looking around. BP immediately after 2 instance 165/95. Pt encouraged to continue mobility with nursing but denied sitting up in chair due to finding it uncomfortable. Will continue to follow.    Follow Up Recommendations  Home health PT;Supervision for mobility/OOB     Equipment Recommendations  None recommended by PT    Recommendations for Other Services       Precautions / Restrictions Precautions Precautions: Fall    Mobility  Bed Mobility Overal bed mobility: Needs Assistance Bed Mobility: Sit to Supine       Sit to supine: Min assist   General bed mobility comments: cues and assist to bring legs onto surface with return to bed  Transfers Overall transfer level: Needs assistance   Transfers: Sit to/from Stand;Stand Pivot Transfers Sit to Stand: Min assist;+2 safety/equipment Stand pivot transfers: Min assist;+2 safety/equipment       General transfer comment: Pt stood from bed using momentum and elevated surface x 2, from low BSC x 2, from recliner with armrests and assist  x 1. Pivot recliner >BSC  Ambulation/Gait Ambulation/Gait assistance: Min assist;+2 safety/equipment Ambulation Distance (Feet): 150  Feet Assistive device: Rolling walker (2 wheeled) Gait Pattern/deviations: Step-through pattern;Decreased stride length;Trunk flexed;Wide base of support   Gait velocity interpretation: Below normal speed for age/gender General Gait Details: Pt with cues to step into RW, keep head up and cues for stability and posture. Pt walked first trial 53' with a brief staring episode in standing with assist to chair for safety. 2nd trial pt ambulated 150' with chair following closely maintaining flexed trunk despite cues   Stairs            Wheelchair Mobility    Modified Rankin (Stroke Patients Only)       Balance Overall balance assessment: Needs assistance;History of Falls   Sitting balance-Leahy Scale: Good       Standing balance-Leahy Scale: Poor                      Cognition Arousal/Alertness: Awake/alert Behavior During Therapy: WFL for tasks assessed/performed Overall Cognitive Status: Within Functional Limits for tasks assessed                      Exercises      General Comments        Pertinent Vitals/Pain Pain Score: 6  Pain Location: chest pain end of session after walking Pain Descriptors / Indicators: Aching Pain Intervention(s): Repositioned;Other (comment) (RN made aware and assessing pt end of session)         End of session sats 99% on RA with HR 95  Home Living  Prior Function            PT Goals (current goals can now be found in the care plan section) Progress towards PT goals: Progressing toward goals    Frequency  Min 3X/week    PT Plan Discharge plan needs to be updated    Co-evaluation             End of Session Equipment Utilized During Treatment: Gait belt Activity Tolerance: Patient tolerated treatment well Patient left: in bed;with call bell/phone within reach;with bed alarm set     Time: [id number] PT Time Calculation (min) (ACUTE ONLY): 51 min  Charges:  $Gait Training:  8-22 mins $Therapeutic Activity: 23-37 mins                    G CodesMelford Aase 22-Nov-2014, 10:39 AM Elwyn Reach, [place]

## 2014-11-18 NOTE — Progress Notes (Signed)
Foley exchanged per MD order. 18Fr Foley removed and 16Fr Foley inserted using sterile technique.  Urine returned noted. Foley secured to leg with securement device.  Bag labeled and place below level of bladder. Will continue to monitor pt closely.

## 2014-11-19 LAB — GLUCOSE, CAPILLARY
GLUCOSE-CAPILLARY: 170 mg/dL — AB (ref 70–99)
Glucose-Capillary: 126 mg/dL — ABNORMAL HIGH (ref 70–99)
Glucose-Capillary: 191 mg/dL — ABNORMAL HIGH (ref 70–99)
Glucose-Capillary: 245 mg/dL — ABNORMAL HIGH (ref 70–99)

## 2014-11-19 LAB — STOOL CULTURE

## 2014-11-19 MED ORDER — PANTOPRAZOLE SODIUM 40 MG PO TBEC
40.0000 mg | DELAYED_RELEASE_TABLET | Freq: Every day | ORAL | Status: DC
  Administered 2014-11-19: 40 mg via ORAL
  Filled 2014-11-19: qty 1

## 2014-11-19 MED ORDER — LACOSAMIDE 150 MG PO TABS: 150.0000 mg | ORAL_TABLET | Freq: Two times a day (BID) | ORAL | Status: DC

## 2014-11-19 MED ORDER — DICYCLOMINE HCL 10 MG PO CAPS: 10.0000 mg | ORAL_CAPSULE | Freq: Three times a day (TID) | ORAL | Status: DC

## 2014-11-19 MED ORDER — CALCIUM CARBONATE ANTACID 500 MG PO CHEW: 1.0000 | CHEWABLE_TABLET | Freq: Three times a day (TID) | ORAL | Status: DC | PRN

## 2014-11-19 NOTE — Discharge Summary (Addendum)
Physician Discharge Summary  [name] [id number] DOB: 08-09-1946 DOA: 11/14/2014  PCP: Irven Shelling, MD  Admit date: 11/14/2014 Discharge date: 11/19/2014  Time spent: 45 minutes  Recommendations for Outpatient Follow-up:  1. F/u with [facility] in 1 wk 2. Home with HHRN to ensure he is taking meds appropriately and HHPT 3. 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 male Patient was discharged from the hospital on 3/3 after being admitted for chest pain which was determined to be non-cardiac and most likely GI related. Patient came to the ER earlier yesterday as he stepped on his foley and needed it to be re-adjusted. He went home in stable condition. Patient returned to the ER later in the day- was brought in by EMS for diarrhea-had 2 witness syncopal episodes en route. C/o diarrhea for days- innumerable amount yesterday. On exam has RUQ and RLQ tenderness.   Hospital Course:  Principal Problem:  Unresponsive spells - ECHO and MRI unrevealing- orthostatics negative today - carotid duplex results below- discussed with Neuro- no further work up needed - have asked for Neuro eval to r/o seizures- they are giving him a trial of Vimpat and recommend 24 hr ambulatory EEG - - spoke with Neuro again  - recommended to increase  Vimpat to 150 BID which I have done - no further spells noted in the past 24 hrs- will make appt to f/u with [facility] as outpt   Active Problems: Diarrhea- right sided abdominal tenderness - he refused CT abd/pelvis on 3/7 - refused to stay NPO- demanded solid food which I started-abdominal pain and tenderness recurred- agreeable to have CT which was unrevealing - GI subsequently consulted- per GI, this is a chronic issue- they ordered TTG for Celiac dxand recommend and anti-spasmodic for the pain and Carafate and Questran if diarrhea recurs (which has not) - pain improved with Bentyl which I will continue has not had any further diarrhea since being admitted - C diff, stool culture neg, fecal lactoferrin negatvie  Chest pain at rest -this is not a new complaint- he had this on his prior admission as well- was suspected to be GI related as it resolved after a GI cocktail - cardiac enzymes x 3 were negative at that time - no troponin ordered at this admission  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-- previous culture from last admission revealed > 100,000 multiple morphotypes - now growing MRSA- Likely colonization- blood cultures x 2 are negative  HTN - resumed Metoprolol and Lisinopril which were on hold due to hypotension   DM (diabetes mellitus) with complications - 123456 9.1 - takes 70/30 50 U BID- cont 30 BID - sugars well controlled on this  OSA (obstructive sleep apnea) - CPAP at bedtime  Obesity- BMI 49  Chronic diastolic CHF (congestive heart failure) - not on diuretics at home - compensated  Chronic foley - foley pulled by pateint- balloon moved into prostate-pt demanded foley to be changed to 18 fr which subsequently resulted in leaking of urine from around catheter- foley changed again to 16 fr and patient explained that this is the appropriate size for him- now stable  Non compliance - refusing multiple treatments and tests - very doubtful that the takes  his medications properly at home-  - unfortunately he has no SNF days left and will need to go home alone which makes him a very high risk for re-admission - will order HHRN/ PT to help prevent re- admission  Consultations:  GI  Neuro  Discharge Exam: Filed Weights   11/17/14 0455 11/18/14 0414 11/19/14 0410  Weight: 158.305 kg (349 lb) 155.3 kg (342 lb 6 oz) 155.039 kg (341 lb 12.8 oz)   Filed Vitals:   11/19/14 0410  BP: 134/58  Pulse: 67  Temp: 98.3 F (36.8 C)  Resp: 18    General: AAO x 3, no distress Cardiovascular: RRR, no murmurs  Respiratory: clear to auscultation bilaterally GI: soft, non-tender, non-distended, bowel sound positive  Discharge Instructions You were cared for by a hospitalist during your hospital stay. If you have any questions about your discharge medications or the care you received while you were in the hospital after you are discharged, you can call the unit and asked to speak with the hospitalist on call if the hospitalist that took care of you is not available. Once you are discharged, your primary care physician will handle any further medical issues. Please note that NO REFILLS for any discharge medications will be authorized once you are discharged, as it is imperative that you return to your primary care physician (or establish a relationship with a primary care physician if you do not have one) for your aftercare needs so that they can reassess your need for medications and monitor your lab values.      Discharge Instructions    Discharge instructions    Complete by:  As directed   Diet low sodium, heart healthy and diabetic     Increase activity slowly    Complete by:  As directed             Medication List    TAKE these medications        aspirin EC 81 MG tablet  Take 81 mg by mouth every morning.     calcium carbonate 500 MG chewable tablet  Commonly known as:  TUMS - dosed in mg elemental calcium  Chew 1 tablet (200 mg of  elemental calcium total) by mouth 3 (three) times daily as needed for indigestion or heartburn.     dicyclomine 10 MG capsule  Commonly known as:  BENTYL  Take 1 capsule (10 mg total) by mouth 3 (three) times daily before meals.     diphenhydrAMINE 25 MG tablet  Commonly known as:  SOMINEX  Take 25 mg by mouth at bedtime as needed for sleep.     diphenoxylate-atropine 2.5-0.025 MG per tablet  Commonly known as:  LOMOTIL  Take 1 tablet by mouth 3 (three) times daily as needed. For diarrhea     fexofenadine 180 MG tablet  Commonly known as:  ALLEGRA  Take 180 mg by mouth at bedtime as needed for allergies.     furosemide 40 MG tablet  Commonly known as:  LASIX  Take 1 tablet (40 mg total) by mouth daily.     HUMULIN 70/30 (70-30) 100 UNIT/ML  injection  Generic drug:  insulin NPH-regular Human  Inject 50 Units into the skin 2 (two) times daily.     HYDROcodone-acetaminophen 5-325 MG per tablet  Commonly known as:  NORCO/VICODIN  Take 1 tablet by mouth 2 (two) times daily as needed for severe pain.     isosorbide mononitrate 30 MG 24 hr tablet  Commonly known as:  IMDUR  Take 1 tablet (30 mg total) by mouth daily.     Lacosamide 150 MG Tabs  Take 1 tablet (150 mg total) by mouth 2 (two) times daily.     lisinopril 5 MG tablet  Commonly known as:  PRINIVIL,ZESTRIL  Take 5 mg by mouth daily.     metoprolol tartrate 25 MG tablet  Commonly known as:  LOPRESSOR  Take 1 tablet (25 mg total) by mouth 2 (two) times daily.     nitroGLYCERIN 0.4 MG SL tablet  Commonly known as:  NITROSTAT  Place 0.4 mg under the tongue every 5 (five) minutes as needed for chest pain.     omeprazole 20 MG capsule  Commonly known as:  PRILOSEC  Take 20 mg by mouth daily.     oxybutynin 5 MG tablet  Commonly known as:  DITROPAN  Take 5 mg by mouth 3 (three) times daily as needed for bladder spasms.     Sennosides 25 MG Tabs  Take 25 mg by mouth daily as needed (for constipation).        Allergies  Allergen Reactions  . Ace Inhibitors Swelling    Pt tolerates lisinopril  . Lipitor [Atorvastatin Calcium] Swelling  . Metformin And Related Swelling  . Cefadroxil Hives  . Cephalexin Hives  . Morphine And Related Other (See Comments)    Sweating, feels like is "in rocky boat."  . Robaxin [Methocarbamol] Other (See Comments)    Feels like he is shaky   Follow-up Information    Follow up with [facility].   Specialty:  Home Health Services   Why:  Home Health RN, Physical Therapy, Social Worker and Proofreader information:   [address] [place] [address] [phone]       Follow up with [facility] On 11/26/2014.   Why:  f/u appt 10:45am with Dr. Jaynee Eagles on 3/18, please arrive 30 minutes early (10:15am)   Contact information:   [address]     [place] [phone]      Follow up with Irven Shelling, MD. Go on 11/25/2014.   Specialty:  Internal Medicine   Why:  1:15 pm   Contact information:   [address], [address] [place] [address] [phone]        The results of significant diagnostics from this hospitalization (including imaging, microbiology, ancillary and laboratory) are listed below for reference.    Significant Diagnostic Studies: Dg Chest 2 View  11/10/2014   CLINICAL DATA:  Shortness of breath and chest pressure on the left today.  EXAM: CHEST  2 VIEW  COMPARISON:  Single view of the chest 08/27/2014. CT chest 09/14/2014.  FINDINGS: There is cardiomegaly without edema. The lungs are clear. No pneumothorax or pleural effusion.  IMPRESSION: Cardiomegaly without acute disease.   Electronically Signed   By: Inge Rise M.D.   On: 11/10/2014 15:22   Ct Head Wo Contrast  11/15/2014   CLINICAL DATA:  Syncope and collapse x 2, history of stroke, diabetes, CHF.  EXAM: CT  HEAD WITHOUT CONTRAST  TECHNIQUE: Contiguous axial images were  obtained from the base of the skull through the vertex without intravenous contrast.  COMPARISON:  MRI of the brain August 28, 2014 and CT of the head June 25, 2014  FINDINGS: Mild motion degraded examination.  The ventricles and sulci are normal for age. No intraparenchymal hemorrhage, mass effect nor midline shift. Minimal supratentorial white matter hypodensities are within normal range for patient's age and though non-specific suggest sequelae of chronic small vessel ischemic disease. No acute large vascular territory infarcts. Symmetric basal ganglia and thalamus mineralization.  No abnormal extra-axial fluid collections. Basal cisterns are patent. Mild calcific atherosclerosis of the carotid siphons and included vertebral arteries.  No skull fracture. The included ocular globes and orbital contents are non-suspicious. Trace paranasal sinus mucosal thickening without air-fluid levels. Patient is edentulous.  IMPRESSION: No acute intracranial process, stable from prior imaging.   Electronically Signed   By: Elon Alas   On: 11/15/2014 01:51   Mr Jodene Nam Head Wo Contrast  11/15/2014   CLINICAL DATA:  Two brief syncopal episodes this evening. Intermittent chest discomfort. Seen earlier in emergency department today for accidental Foley catheter removal. History of diabetic neuropathy, stroke with residual RIGHT-sided weakness, heart failure.  EXAM: MRI HEAD WITHOUT CONTRAST  MRA HEAD WITHOUT CONTRAST  TECHNIQUE: Multiplanar, multiecho pulse sequences of the brain and surrounding structures were obtained without intravenous contrast. Angiographic images of the head were obtained using MRA technique without contrast.  COMPARISON:  CT of the head November 15, 2014 at 10:06 a.m. and MRI/ MRA of the brain August 28, 2014  FINDINGS: MRI HEAD FINDINGS (Mild motion degraded examination)  No reduced diffusion to suggest acute ischemia. No susceptibility artifact is suggests hemorrhage ; vein susceptibility artifact  in the bilateral basal ganglia and LEFT greater than RIGHT thalamus corresponding to mineralization.  The ventricles and sulci are normal for patient's age. Punctate T2 hyperintensity in RIGHT cerebellum. Remote LEFT basal ganglia lacunar infarct. Small area of cystic encephalomalacia LEFT posterior median frontal lobe, unchanged. No midline shift, mass effect or mass lesions.  No abnormal extra-axial fluid collections. Ocular globes and orbital contents are unremarkable. Mild paranasal sinus mucosal thickening without air-fluid levels. Mastoid air cells are well aerated. No abnormal sellar expansion. No cerebellar tonsillar ectopia.  MRA HEAD FINDINGS (moderately motion degraded examination)  Anterior circulation: Normal flow related enhancement of the included cervical, petrous, cavernous and supra clinoid internal carotid arteries. Patent anterior communicating artery. Preserved flow related enhancement of the anterior and middle cerebral arteries, including more distal segments.  Posterior circulation: LEFT vertebral artery is dominant. Basilar artery is patent, with normal flow related enhancement of the main branch vessels. Preserved flow related enhancement of the posterior cerebral arteries.  No large vessel occlusion within the anterior or posterior circulation. Limited assessment of the aneurysm, luminal irregularity due to motion.  IMPRESSION: MRI HEAD: Mild motion degraded examination. No acute intracranial process, specifically no acute ischemia.  Remote LEFT basal ganglia lacunar infarct. Small area LEFT mesial posterior frontal lobe encephalomalacia is likely post ischemic.  MRA HEAD: Moderately motion degraded examination without large vessel occlusion.   Electronically Signed   By: Elon Alas   On: 11/15/2014 03:40   Mr Brain Wo Contrast  11/15/2014   CLINICAL DATA:  Two brief syncopal episodes this evening. Intermittent chest discomfort. Seen earlier in emergency department today for  accidental Foley catheter removal. History of diabetic neuropathy, stroke with residual RIGHT-sided weakness, heart failure.  EXAM:  MRI HEAD WITHOUT CONTRAST  MRA HEAD WITHOUT CONTRAST  TECHNIQUE: Multiplanar, multiecho pulse sequences of the brain and surrounding structures were obtained without intravenous contrast. Angiographic images of the head were obtained using MRA technique without contrast.  COMPARISON:  CT of the head November 15, 2014 at 10:06 a.m. and MRI/ MRA of the brain August 28, 2014  FINDINGS: MRI HEAD FINDINGS (Mild motion degraded examination)  No reduced diffusion to suggest acute ischemia. No susceptibility artifact is suggests hemorrhage ; vein susceptibility artifact in the bilateral basal ganglia and LEFT greater than RIGHT thalamus corresponding to mineralization.  The ventricles and sulci are normal for patient's age. Punctate T2 hyperintensity in RIGHT cerebellum. Remote LEFT basal ganglia lacunar infarct. Small area of cystic encephalomalacia LEFT posterior median frontal lobe, unchanged. No midline shift, mass effect or mass lesions.  No abnormal extra-axial fluid collections. Ocular globes and orbital contents are unremarkable. Mild paranasal sinus mucosal thickening without air-fluid levels. Mastoid air cells are well aerated. No abnormal sellar expansion. No cerebellar tonsillar ectopia.  MRA HEAD FINDINGS (moderately motion degraded examination)  Anterior circulation: Normal flow related enhancement of the included cervical, petrous, cavernous and supra clinoid internal carotid arteries. Patent anterior communicating artery. Preserved flow related enhancement of the anterior and middle cerebral arteries, including more distal segments.  Posterior circulation: LEFT vertebral artery is dominant. Basilar artery is patent, with normal flow related enhancement of the main branch vessels. Preserved flow related enhancement of the posterior cerebral arteries.  No large vessel occlusion  within the anterior or posterior circulation. Limited assessment of the aneurysm, luminal irregularity due to motion.  IMPRESSION: MRI HEAD: Mild motion degraded examination. No acute intracranial process, specifically no acute ischemia.  Remote LEFT basal ganglia lacunar infarct. Small area LEFT mesial posterior frontal lobe encephalomalacia is likely post ischemic.  MRA HEAD: Moderately motion degraded examination without large vessel occlusion.   Electronically Signed   By: Elon Alas   On: 11/15/2014 03:40   Ct Abdomen Pelvis W Contrast  11/17/2014   CLINICAL DATA:  Diarrhea and recent syncopal of the with right-sided abdominal pain  EXAM: CT ABDOMEN AND PELVIS WITH CONTRAST  TECHNIQUE: Multidetector CT imaging of the abdomen and pelvis was performed using the standard protocol following bolus administration of intravenous contrast.  CONTRAST:  112mL OMNIPAQUE IOHEXOL 300 MG/ML  SOLN  COMPARISON:  06/25/2014  FINDINGS: Lung bases demonstrate small right-sided pleural effusion which is new from the prior exam.  The liver, spleen, adrenal glands and pancreas are stable in appearance from prior exam. 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 The gallbladder has been surgically removed. The kidneys are well visualized and demonstrates some cystic change. No obstructive changes or renal calculi are noted. Retro aortic left renal vein is seen.  The appendix is within normal limits. Mild diverticular change of the colon is noted. The bladder is well distended. Some air is noted within the bladder likely related to instrumentation. A Foley catheter is seen although the balloon appears to be lodged within the prostate. This should be deflated and advanced and reinflated. The osseous structures show no acute abnormality. Postsurgical changes are noted in the upper abdomen.  IMPRESSION: Foley catheter balloon appears to lie within the prostate. This should be deflated fat and advanced. Air is noted within the  bladder likely related instrumentation and the catheter placement.  Small right-sided pleural effusion.  The remainder of the exam is stable from the prior study.   Electronically Signed   By: Elta Guadeloupe  Lukens M.D.   On: 11/17/2014 14:42   Dg Chest Port 1 View  11/14/2014   CLINICAL DATA:  Diarrhea.  Loss of consciousness.  EXAM: PORTABLE CHEST - 1 VIEW  COMPARISON:  11/10/2014  FINDINGS: Cardiac enlargement with mild vascular congestion. Negative for edema or effusion. Negative for infiltrate.  IMPRESSION: Cardiac enlargement with vascular congestion.  Negative for edema.   Electronically Signed   By: Franchot Gallo M.D.   On: 11/14/2014 19:40    Microbiology: Recent Results (from the past 240 hour(s))  Urine culture     Status: None   Collection Time: 11/10/14  1:34 PM  Result Value Ref Range Status   Specimen Description URINE, CLEAN CATCH  Final   Special Requests NONE  Final   Colony Count   Final    >=100,000 COLONIES/ML Performed at [facility]    Culture   Final    Multiple bacterial morphotypes present, none predominant. Suggest appropriate recollection if clinically indicated. Performed at [facility]    Report Status 11/11/2014 FINAL  Final  MRSA PCR Screening     Status: Abnormal   Collection Time: 11/10/14 11:44 PM  Result Value Ref Range Status   MRSA by PCR POSITIVE (A) NEGATIVE Final    Comment:        The GeneXpert MRSA Assay (FDA approved for NASAL specimens only), is one component of a comprehensive MRSA colonization surveillance program. It is not intended to diagnose MRSA infection nor to guide or monitor treatment for MRSA infections. RESULT CALLED TO, READ BACK BY AND VERIFIED WITH: R.[facility] 0302 11/11/14 M.CAMPBELL   Clostridium Difficile by PCR     Status: None   Collection Time: 11/14/14  8:36 PM  Result Value Ref Range Status   C difficile by pcr NEGATIVE NEGATIVE Final  Stool culture     Status: None   Collection Time: 11/14/14   8:36 PM  Result Value Ref Range Status   Specimen Description STOOL  Final   Special Requests NONE  Final   Culture   Final    NO SALMONELLA, SHIGELLA, CAMPYLOBACTER, YERSINIA, OR E.COLI 0157:H7 ISOLATED Performed at [facility]    Report Status 11/19/2014 FINAL  Final  Culture, Urine     Status: None   Collection Time: 11/15/14  5:15 AM  Result Value Ref Range Status   Specimen Description URINE, CATHETERIZED  Final   Special Requests NONE  Final   Colony Count   Final    >=100,000 COLONIES/ML Performed at [facility]    Culture   Final    METHICILLIN RESISTANT STAPHYLOCOCCUS AUREUS Note: RIFAMPIN AND GENTAMICIN SHOULD NOT BE USED AS SINGLE DRUGS FOR TREATMENT OF STAPH INFECTIONS. CRITICAL RESULT CALLED TO, READ BACK BY AND VERIFIED WITH: DINA 3/9 AT 1230 BY DUNNJ Performed at [facility]    Report Status 11/17/2014 FINAL  Final   Organism ID, Bacteria METHICILLIN RESISTANT STAPHYLOCOCCUS AUREUS  Final      Susceptibility   Methicillin resistant staphylococcus aureus - MIC*    GENTAMICIN <=0.5 SENSITIVE Sensitive     LEVOFLOXACIN >=8 RESISTANT Resistant     NITROFURANTOIN <=16 SENSITIVE Sensitive     OXACILLIN >=4 RESISTANT Resistant     PENICILLIN >=0.5 RESISTANT Resistant     RIFAMPIN <=0.5 SENSITIVE Sensitive     TRIMETH/SULFA >=320 RESISTANT Resistant     VANCOMYCIN 1 SENSITIVE Sensitive     TETRACYCLINE <=1 SENSITIVE Sensitive     * METHICILLIN RESISTANT STAPHYLOCOCCUS  AUREUS  Culture, blood (routine x 2)     Status: None (Preliminary result)   Collection Time: 11/16/14  8:30 AM  Result Value Ref Range Status   Specimen Description BLOOD RIGHT ANTECUBITAL  Final   Special Requests BOTTLES DRAWN AEROBIC AND ANAEROBIC 10CC  Final   Culture   Final           BLOOD CULTURE RECEIVED NO GROWTH TO DATE CULTURE WILL BE HELD FOR 5 DAYS BEFORE ISSUING A FINAL NEGATIVE REPORT Performed at [facility]    Report Status PENDING  Incomplete   Culture, blood (routine x 2)     Status: None (Preliminary result)   Collection Time: 11/16/14  8:35 AM  Result Value Ref Range Status   Specimen Description BLOOD BLOOD RIGHT FOREARM  Final   Special Requests BOTTLES DRAWN AEROBIC AND ANAEROBIC 10CC  Final   Culture   Final           BLOOD CULTURE RECEIVED NO GROWTH TO DATE CULTURE WILL BE HELD FOR 5 DAYS BEFORE ISSUING A FINAL NEGATIVE REPORT Performed at [facility]    Report Status PENDING  Incomplete     Labs: Basic Metabolic Panel:  Recent Labs Lab 11/14/14 1900 11/15/14 0527 11/18/14 0553  NA 134* 137 138  K 4.2 3.8 3.6  CL 101 102 102  CO2 29 30 32  GLUCOSE 231* 181* 114*  BUN 6 <5* <5*  CREATININE 0.80 0.77 0.72  CALCIUM 8.3* 8.0* 8.5  MG  --  1.8  --   PHOS  --  3.3  --    Liver Function Tests:  Recent Labs Lab 11/14/14 1900 11/15/14 0527  AST 20 15  ALT 15 14  ALKPHOS 91 84  BILITOT 0.8 0.7  PROT 6.5 5.6*  ALBUMIN 3.2* 2.9*   No results for input(s): LIPASE, AMYLASE in the last 168 hours. No results for input(s): AMMONIA in the last 168 hours. CBC:  Recent Labs Lab 11/14/14 1900 11/15/14 0527 11/18/14 0553  WBC 9.3 7.8 6.9  NEUTROABS 7.0 5.0  --   HGB 13.8 12.9* 14.5  HCT 42.6 38.4* 43.0  MCV 88.0 88.5 89.6  PLT 202 187 213   Cardiac Enzymes:  Recent Labs Lab 11/14/14 1900  TROPONINI <0.03   BNP: BNP (last 3 results)  Recent Labs  09/14/14 0644  BNP 95.8    ProBNP (last 3 results)  Recent Labs  03/21/14 1551 04/01/14 1431 08/27/14 1548  PROBNP 2003.0* 658.6* 1121.0*    CBG:  Recent Labs Lab 11/18/14 2002 11/18/14 2357 11/19/14 0412 11/19/14 0609 11/19/14 1116  GLUCAP 132* 245* 191* 126* 170*       SignedDebbe Odea, MD [facility] 11/19/2014, 3:36 PM

## 2014-11-19 NOTE — Progress Notes (Signed)
Pt left via [facility] with belongings.

## 2014-11-19 NOTE — Care Management Note (Signed)
    Page 1 of 2   11/19/2014     10:17:23 AM CARE MANAGEMENT NOTE 11/19/2014  Patient:  [name], [name]   Account Number:  [id number]  Date Initiated:  11/16/2014  Documentation initiated by:  [facility]  Subjective/Objective Assessment:   Syncope and collapse     Action/Plan:   HH   Anticipated DC Date:  11/16/2014   Anticipated DC Plan:  [place]  CM consult      [facility] Choice  Resumption Of Svcs/PTA Provider   Choice offered to / List presented to:  C-1 Patient   DME arranged  3-N-1      DME agency  [place] arranged  HH-2 PT  HH-1 RN  [place]      [facility] agency  [place]   Status of service:  Completed, signed off Medicare Important Message given?  YES (If response is "NO", the following Medicare IM given date fields will be blank) Date Medicare IM given:  11/16/2014 Medicare IM given by:  [facility] Date Additional Medicare IM given:  11/19/2014 Additional Medicare IM given by:  [name]  Discharge Disposition:  [place]  Per UR Regulation:    If discussed at Long Length of Stay Meetings, dates discussed:   11/18/2014    Comments:  11-17-14 10:15am [name], RNBSN [phone] Plan for dc home today - Called [facility] to notify them of impending dc.  11/16/2014 1450 Pt is active with [facility] for [facility]. NCM spoke to pt and he has RW, wheelchair and grab bars in bathroom at home. He lives alone but feels he is can manage at home. He has used Med benefits for rehab. Pt requesting 3n1 bedside commode for home. Contacted [facility] for 3n1 bedside commode. Orders in [place] for [facility]. Notified [facility]. NCM will continue to follow for dc needs. Jonnie Finner RN CCM Case Mgmt phone [phone]

## 2014-11-19 NOTE — Progress Notes (Addendum)
Assessment unchanged. Discussed D/C instructions with pt including f/u appointments and new medications. Verbalized understanding. RX already called in. IV removed. [facility] number given to pt. Pt awaiting PTAR ride. Will continue to monitor pt closely.

## 2014-11-19 NOTE — Clinical Social Work Note (Signed)
Patient will discharge to home Anticipated discharge date:11/19/14 Family notified: patient to notify Transportation by [facility]AR- scheduled for 1pm  [name] signing off.  Domenica Reamer, [place] Social Worker [phone]

## 2014-11-19 NOTE — Progress Notes (Signed)
Pt walked 150 with RN and NT. Pt did not have any syncopal episodes when changing positions or walking. Pt assisted to bed as chair is uncomfortable. Will continue to monitor pt closely.

## 2014-11-20 DIAGNOSIS — L89612 Pressure ulcer of right heel, stage 2: Secondary | ICD-10-CM | POA: Diagnosis not present

## 2014-11-20 DIAGNOSIS — L89312 Pressure ulcer of right buttock, stage 2: Secondary | ICD-10-CM | POA: Diagnosis not present

## 2014-11-21 LAB — TISSUE TRANSGLUTAMINASE, IGA: Tissue Transglutaminase Ab, IgA: <2 U/mL (ref 0–3)

## 2014-11-22 LAB — CULTURE, BLOOD (ROUTINE X 2)
Culture: NO GROWTH
Culture: NO GROWTH

## 2014-11-24 DIAGNOSIS — L89312 Pressure ulcer of right buttock, stage 2: Secondary | ICD-10-CM | POA: Diagnosis not present

## 2014-11-24 DIAGNOSIS — L89612 Pressure ulcer of right heel, stage 2: Secondary | ICD-10-CM | POA: Diagnosis not present

## 2014-11-26 ENCOUNTER — Ambulatory Visit: Payer: Medicare Other | Admitting: Neurology

## 2014-11-29 ENCOUNTER — Encounter: Payer: Medicare Other | Admitting: Physician Assistant

## 2014-11-30 DIAGNOSIS — L89612 Pressure ulcer of right heel, stage 2: Secondary | ICD-10-CM | POA: Diagnosis not present

## 2014-11-30 DIAGNOSIS — L89312 Pressure ulcer of right buttock, stage 2: Secondary | ICD-10-CM | POA: Diagnosis not present

## 2014-12-03 DIAGNOSIS — L89312 Pressure ulcer of right buttock, stage 2: Secondary | ICD-10-CM | POA: Diagnosis not present

## 2014-12-03 DIAGNOSIS — L89612 Pressure ulcer of right heel, stage 2: Secondary | ICD-10-CM | POA: Diagnosis not present

## 2014-12-07 ENCOUNTER — Observation Stay (HOSPITAL_COMMUNITY)
Admission: EM | Admit: 2014-12-07 | Discharge: 2014-12-09 | Disposition: A | Discharge status: Home / Self Care | Payer: Medicare Other | Attending: Cardiology | Admitting: Cardiology

## 2014-12-07 ENCOUNTER — Emergency Department (HOSPITAL_COMMUNITY): Payer: Medicare Other

## 2014-12-07 ENCOUNTER — Encounter (HOSPITAL_COMMUNITY): Payer: Self-pay | Admitting: Emergency Medicine

## 2014-12-07 DIAGNOSIS — Z87891 Personal history of nicotine dependence: Secondary | ICD-10-CM | POA: Diagnosis not present

## 2014-12-07 DIAGNOSIS — R079 Chest pain, unspecified: Principal | ICD-10-CM | POA: Diagnosis present

## 2014-12-07 DIAGNOSIS — Z79899 Other long term (current) drug therapy: Secondary | ICD-10-CM | POA: Insufficient documentation

## 2014-12-07 DIAGNOSIS — Z9889 Other specified postprocedural states: Secondary | ICD-10-CM | POA: Diagnosis not present

## 2014-12-07 DIAGNOSIS — R110 Nausea: Secondary | ICD-10-CM | POA: Diagnosis not present

## 2014-12-07 DIAGNOSIS — M1990 Unspecified osteoarthritis, unspecified site: Secondary | ICD-10-CM | POA: Diagnosis not present

## 2014-12-07 DIAGNOSIS — Z794 Long term (current) use of insulin: Secondary | ICD-10-CM | POA: Insufficient documentation

## 2014-12-07 DIAGNOSIS — K219 Gastro-esophageal reflux disease without esophagitis: Secondary | ICD-10-CM | POA: Insufficient documentation

## 2014-12-07 DIAGNOSIS — E1140 Type 2 diabetes mellitus with diabetic neuropathy, unspecified: Secondary | ICD-10-CM | POA: Diagnosis present

## 2014-12-07 DIAGNOSIS — Z86718 Personal history of other venous thrombosis and embolism: Secondary | ICD-10-CM

## 2014-12-07 DIAGNOSIS — I10 Essential (primary) hypertension: Secondary | ICD-10-CM | POA: Diagnosis present

## 2014-12-07 DIAGNOSIS — G8929 Other chronic pain: Secondary | ICD-10-CM | POA: Diagnosis not present

## 2014-12-07 DIAGNOSIS — Z8701 Personal history of pneumonia (recurrent): Secondary | ICD-10-CM | POA: Insufficient documentation

## 2014-12-07 DIAGNOSIS — Z960 Presence of urogenital implants: Secondary | ICD-10-CM

## 2014-12-07 DIAGNOSIS — E785 Hyperlipidemia, unspecified: Secondary | ICD-10-CM | POA: Insufficient documentation

## 2014-12-07 DIAGNOSIS — G4733 Obstructive sleep apnea (adult) (pediatric): Secondary | ICD-10-CM | POA: Diagnosis present

## 2014-12-07 DIAGNOSIS — Z978 Presence of other specified devices: Secondary | ICD-10-CM

## 2014-12-07 DIAGNOSIS — R0789 Other chest pain: Secondary | ICD-10-CM | POA: Diagnosis not present

## 2014-12-07 DIAGNOSIS — Z7982 Long term (current) use of aspirin: Secondary | ICD-10-CM | POA: Insufficient documentation

## 2014-12-07 DIAGNOSIS — R269 Unspecified abnormalities of gait and mobility: Secondary | ICD-10-CM | POA: Insufficient documentation

## 2014-12-07 DIAGNOSIS — E669 Obesity, unspecified: Secondary | ICD-10-CM | POA: Diagnosis not present

## 2014-12-07 DIAGNOSIS — R600 Localized edema: Secondary | ICD-10-CM | POA: Diagnosis not present

## 2014-12-07 DIAGNOSIS — L89312 Pressure ulcer of right buttock, stage 2: Secondary | ICD-10-CM | POA: Diagnosis not present

## 2014-12-07 DIAGNOSIS — I509 Heart failure, unspecified: Secondary | ICD-10-CM | POA: Insufficient documentation

## 2014-12-07 DIAGNOSIS — I2510 Atherosclerotic heart disease of native coronary artery without angina pectoris: Secondary | ICD-10-CM | POA: Diagnosis not present

## 2014-12-07 DIAGNOSIS — R5381 Other malaise: Secondary | ICD-10-CM | POA: Diagnosis present

## 2014-12-07 DIAGNOSIS — E0800 Diabetes mellitus due to underlying condition with hyperosmolarity without nonketotic hyperglycemic-hyperosmolar coma (NKHHC): Secondary | ICD-10-CM | POA: Diagnosis present

## 2014-12-07 DIAGNOSIS — Z8673 Personal history of transient ischemic attack (TIA), and cerebral infarction without residual deficits: Secondary | ICD-10-CM | POA: Insufficient documentation

## 2014-12-07 DIAGNOSIS — L89612 Pressure ulcer of right heel, stage 2: Secondary | ICD-10-CM | POA: Diagnosis not present

## 2014-12-07 LAB — CBC WITH DIFFERENTIAL/PLATELET
Basophils Absolute: 0.1 10*3/uL (ref 0.0–0.1)
Basophils Relative: 1 % (ref 0–1)
EOS PCT: 5 % (ref 0–5)
Eosinophils Absolute: 0.5 10*3/uL (ref 0.0–0.7)
HEMATOCRIT: 38.8 % — AB (ref 39.0–52.0)
Hemoglobin: 13 g/dL (ref 13.0–17.0)
LYMPHS PCT: 20 % (ref 12–46)
Lymphs Abs: 1.6 10*3/uL (ref 0.7–4.0)
MCH: 29.5 pg (ref 26.0–34.0)
MCHC: 33.5 g/dL (ref 30.0–36.0)
MCV: 88 fL (ref 78.0–100.0)
MONOS PCT: 6 % (ref 3–12)
Monocytes Absolute: 0.5 10*3/uL (ref 0.1–1.0)
NEUTROS ABS: 5.6 10*3/uL (ref 1.7–7.7)
Neutrophils Relative %: 68 % (ref 43–77)
PLATELETS: 188 10*3/uL (ref 150–400)
RBC: 4.41 MIL/uL (ref 4.22–5.81)
RDW: 13.6 % (ref 11.5–15.5)
WBC: 8.3 10*3/uL (ref 4.0–10.5)

## 2014-12-07 LAB — I-STAT TROPONIN, ED: Troponin i, poc: 0.01 ng/mL (ref 0.00–0.08)

## 2014-12-07 LAB — I-STAT CHEM 8, ED
BUN: 11 mg/dL (ref 6–23)
CHLORIDE: 100 mmol/L (ref 96–112)
Calcium, Ion: 1.09 mmol/L — ABNORMAL LOW (ref 1.13–1.30)
Creatinine, Ser: 0.7 mg/dL (ref 0.50–1.35)
GLUCOSE: 198 mg/dL — AB (ref 70–99)
HCT: 40 % (ref 39.0–52.0)
Hemoglobin: 13.6 g/dL (ref 13.0–17.0)
Potassium: 3.5 mmol/L (ref 3.5–5.1)
SODIUM: 139 mmol/L (ref 135–145)
TCO2: 23 mmol/L (ref 0–100)

## 2014-12-07 LAB — GLUCOSE, CAPILLARY: Glucose-Capillary: 166 mg/dL — ABNORMAL HIGH (ref 70–99)

## 2014-12-07 LAB — TROPONIN I: Troponin I: <0.03 ng/mL (ref <0.031)

## 2014-12-07 MED ORDER — FENTANYL CITRATE 0.05 MG/ML IJ SOLN
100.0000 ug | Freq: Once | INTRAMUSCULAR | Status: AC
  Administered 2014-12-07: 100 ug via INTRAVENOUS
  Filled 2014-12-07: qty 2

## 2014-12-07 MED ORDER — DICYCLOMINE HCL 10 MG PO CAPS
10.0000 mg | ORAL_CAPSULE | Freq: Three times a day (TID) | ORAL | Status: DC
  Administered 2014-12-08 – 2014-12-09 (×5): 10 mg via ORAL
  Filled 2014-12-07 (×5): qty 1

## 2014-12-07 MED ORDER — LORATADINE 10 MG PO TABS
10.0000 mg | ORAL_TABLET | Freq: Every day | ORAL | Status: DC
  Administered 2014-12-08 – 2014-12-09 (×2): 10 mg via ORAL
  Filled 2014-12-07 (×2): qty 1

## 2014-12-07 MED ORDER — INSULIN ASPART 100 UNIT/ML ~~LOC~~ SOLN
0.0000 [IU] | Freq: Three times a day (TID) | SUBCUTANEOUS | Status: DC
  Administered 2014-12-08: 5 [IU] via SUBCUTANEOUS
  Administered 2014-12-08: 3 [IU] via SUBCUTANEOUS
  Administered 2014-12-09: 2 [IU] via SUBCUTANEOUS

## 2014-12-07 MED ORDER — LISINOPRIL 5 MG PO TABS
5.0000 mg | ORAL_TABLET | Freq: Every day | ORAL | Status: DC
  Administered 2014-12-08 – 2014-12-09 (×2): 5 mg via ORAL
  Filled 2014-12-07 (×2): qty 1

## 2014-12-07 MED ORDER — ONDANSETRON HCL 4 MG/2ML IJ SOLN: 4.0000 mg | Freq: Four times a day (QID) | INTRAMUSCULAR | Status: DC | PRN

## 2014-12-07 MED ORDER — CALCIUM CARBONATE ANTACID 500 MG PO CHEW: 1.0000 | CHEWABLE_TABLET | Freq: Three times a day (TID) | ORAL | Status: DC | PRN

## 2014-12-07 MED ORDER — ZOLPIDEM TARTRATE 5 MG PO TABS: 5.0000 mg | ORAL_TABLET | Freq: Every evening | ORAL | Status: DC | PRN

## 2014-12-07 MED ORDER — DIPHENHYDRAMINE HCL (SLEEP) 25 MG PO TABS: 25.0000 mg | ORAL_TABLET | Freq: Every evening | ORAL | Status: DC | PRN

## 2014-12-07 MED ORDER — FUROSEMIDE 40 MG PO TABS
40.0000 mg | ORAL_TABLET | Freq: Every day | ORAL | Status: DC
  Administered 2014-12-08 – 2014-12-09 (×2): 40 mg via ORAL
  Filled 2014-12-07 (×2): qty 1

## 2014-12-07 MED ORDER — METOPROLOL TARTRATE 50 MG PO TABS
50.0000 mg | ORAL_TABLET | Freq: Two times a day (BID) | ORAL | Status: DC
  Administered 2014-12-07 – 2014-12-09 (×4): 50 mg via ORAL
  Filled 2014-12-07 (×4): qty 1

## 2014-12-07 MED ORDER — OXYBUTYNIN CHLORIDE 5 MG PO TABS: 5.0000 mg | ORAL_TABLET | Freq: Three times a day (TID) | ORAL | Status: DC | PRN

## 2014-12-07 MED ORDER — HYDROCODONE-ACETAMINOPHEN 5-325 MG PO TABS
1.0000 | ORAL_TABLET | Freq: Two times a day (BID) | ORAL | Status: DC | PRN
Start: 2014-12-07 — End: 2014-12-09
  Administered 2014-12-08 – 2014-12-09 (×3): 1 via ORAL
  Filled 2014-12-07 (×3): qty 1

## 2014-12-07 MED ORDER — ENOXAPARIN SODIUM 40 MG/0.4ML ~~LOC~~ SOLN
40.0000 mg | SUBCUTANEOUS | Status: DC
  Administered 2014-12-07: 40 mg via SUBCUTANEOUS
  Filled 2014-12-07 (×2): qty 0.4

## 2014-12-07 MED ORDER — ALPRAZOLAM 0.25 MG PO TABS: 0.2500 mg | ORAL_TABLET | Freq: Two times a day (BID) | ORAL | Status: DC | PRN

## 2014-12-07 MED ORDER — SENNOSIDES 25 MG PO TABS: 25.0000 mg | ORAL_TABLET | Freq: Every day | ORAL | Status: DC | PRN

## 2014-12-07 MED ORDER — ASPIRIN EC 81 MG PO TBEC
81.0000 mg | DELAYED_RELEASE_TABLET | Freq: Every morning | ORAL | Status: DC
  Administered 2014-12-08 – 2014-12-09 (×2): 81 mg via ORAL
  Filled 2014-12-07 (×2): qty 1

## 2014-12-07 MED ORDER — INSULIN ASPART PROT & ASPART (70-30 MIX) 100 UNIT/ML ~~LOC~~ SUSP
40.0000 [IU] | Freq: Two times a day (BID) | SUBCUTANEOUS | Status: DC
  Administered 2014-12-08 – 2014-12-09 (×3): 40 [IU] via SUBCUTANEOUS
  Filled 2014-12-07: qty 10

## 2014-12-07 MED ORDER — PANTOPRAZOLE SODIUM 40 MG PO TBEC
40.0000 mg | DELAYED_RELEASE_TABLET | Freq: Every day | ORAL | Status: DC
  Administered 2014-12-08 – 2014-12-09 (×2): 40 mg via ORAL
  Filled 2014-12-07 (×2): qty 1

## 2014-12-07 MED ORDER — ACETAMINOPHEN 325 MG PO TABS: 650.0000 mg | ORAL_TABLET | ORAL | Status: DC | PRN

## 2014-12-07 MED ORDER — DIPHENOXYLATE-ATROPINE 2.5-0.025 MG PO TABS
1.0000 | ORAL_TABLET | Freq: Three times a day (TID) | ORAL | Status: DC | PRN
  Administered 2014-12-08: 1 via ORAL
  Filled 2014-12-07: qty 1

## 2014-12-07 MED ORDER — SENNA 8.6 MG PO TABS
1.0000 | ORAL_TABLET | Freq: Every day | ORAL | Status: DC | PRN
Start: 2014-12-07 — End: 2014-12-09

## 2014-12-07 NOTE — Consult Note (Addendum)
CARDIOLOGY CONSULT NOTE   Patient ID: [name] MRN: [id number] DOB/AGE: 67-Apr-1947 67 y.o.  Admit date: 12/07/2014  Primary Physician   Irven Shelling, MD Primary Cardiologist   Candee Furbish Reason for Consultation   Chest pain  [id number] [name] is a 69 y.o. year old male with a history of HTN, HLD, Chronic diastolic HF-Echo [id number] (EF of [id number], grade 1 diastolic dysfunction, mild aortic stenosis, and trivial mitral regurgitation).   LHC 10/2011 (significant distal right coronary artery disease, diffuse, long segment up to 90% at the bifurcation of a small PDA branch. 50% stenosis of the proximal large first diagonal branch. Elevated left ventricular end-diastolic pressure of 26 mmHg consistent with diastolic dysfunction). Given the diffuse, long nature of his distal right coronary region, percutaneous intervention to this region portends a high risk to benefit ratio. It was elected to proceed with medical therapy.   Other history includes DVT, stroke (with residual right-sided weakness), diabetes type 2, diabetic neuropathy, neurogenic bladder with a chronic indwelling Foley catheter, and obstructive sleep apnea (not on CPAP).  This morning at 8 a.m. he had chest pain that he describes as pressure over his mid-sternum to left chest. It radiated to his left bicep and he felt some pain in his left jaw. This lasted ~ 35 minutes, he did not take anything to relieve the pain, rated it an 8/10. There was no associated nausea or diaphoresis. His home health nurse was with him and he said she checked his blood pressure and heart rate but he doesn't remember what it was. Around 1 p.m. He began feeling the same type of pressure this time with associated nausea, no diaphoresis. His home health nurse advised him this time to call EMS. Per EMS he was in NSR on the monitor during transport, they gave him 325 of aspirin and 3 nitro without any relief obtained per pt. Upon arrival to ED  he rated his CP at 7/10, his BP was: 129/53, pulse: 64, Troponin: 0.01, he is complaining of SOB. EKG: NSR, 70 with no ST/T wave abnormalities.  He has been having "cold sweats" during the day, SOB at rest and with exertion, he sleeps in a recliner but this is normal for him, edema, and brown liquid diarrhea. He denies fever, palpitations, flutters, dizziness, and syncope.   He was last seen in [facility] 11/14/2014 for unresponsive episodes and states he was supposed to have multiple follow up appointments but he said they told him he was not able to drive and due to this he was unable to go to his appointments. He states his home health nurse was trying to help get him set up with [facility] for assistance with transportation but he hasn't done this yet; he is unclear as to why they told him he couldn't drive.   Past Medical History  Diagnosis Date  . Obese   . Hypertension   . Diabetic neuropathy, painful   . Chronic indwelling Foley catheter   . Bladder spasms   . Hyperlipidemia   . GERD (gastroesophageal reflux disease)   . Neurogenic bladder   . CAD (coronary artery disease)   . PONV (postoperative nausea and vomiting)   . CHF (congestive heart failure)   . DVT (deep venous thrombosis) 1980's    LLE  . Type II diabetes mellitus   . Diabetic diarrhea   . Pneumonia 1999  . OSA (obstructive sleep apnea)     "they wanted me to wear a  mask; I couldn't" (11/10/2014)  . Arthritis     "hands, ankles, knees" (11/10/2014)  . Chronic lower back pain   . Stroke 10/2011; 06/2014    right hand numbness/notes 10/20/2011, pt not sure he had stroke in 2013; "right hand weaker and right face not quite right since" (11/10/2014)     Past Surgical History  Procedure Laterality Date  . Gastroplasty    . Total knee arthroplasty Right 1990's  . Left heart catheterization with coronary angiogram N/A 10/24/2011    Procedure: LEFT HEART CATHETERIZATION WITH CORONARY ANGIOGRAM;  Surgeon: Candee Furbish, MD;  Location: [facility] CATH  LAB;  Service: Cardiovascular;  Laterality: N/A;  right radial artery approach  . Cholecystectomy open  1970's?  . Joint replacement    . Carpal tunnel release Bilateral ~ 2003-2004    Allergies  Allergen Reactions  . Ace Inhibitors Swelling    Pt tolerates lisinopril  . Lipitor [Atorvastatin Calcium] Swelling  . Metformin And Related Swelling  . Cefadroxil Hives  . Cephalexin Hives  . Morphine And Related Other (See Comments)    Sweating, feels like is "in rocky boat."  . Robaxin [Methocarbamol] Other (See Comments)    Feels like he is shaky    I have reviewed the patient's current medications Prior to Admission medications   Medication Sig Start Date End Date Taking? Authorizing Provider  aspirin EC 81 MG tablet Take 81 mg by mouth every morning.    Yes Historical Provider, MD  calcium carbonate (TUMS - DOSED IN MG ELEMENTAL CALCIUM) 500 MG chewable tablet Chew 1 tablet (200 mg of elemental calcium total) by mouth 3 (three) times daily as needed for indigestion or heartburn. 11/19/14  Yes Debbe Odea, MD  dicyclomine (BENTYL) 10 MG capsule Take 1 capsule (10 mg total) by mouth 3 (three) times daily before meals. 11/19/14  Yes Debbe Odea, MD  diphenhydrAMINE (SOMINEX) 25 MG tablet Take 25 mg by mouth at bedtime as needed for sleep.   Yes Historical Provider, MD  diphenoxylate-atropine (LOMOTIL) 2.5-0.025 MG per tablet Take 1 tablet by mouth 3 (three) times daily as needed. For diarrhea 09/06/14  Yes Tiffany Brian Reed, DO  fexofenadine (ALLEGRA) 180 MG tablet Take 180 mg by mouth at bedtime as needed for allergies.    Yes Historical Provider, MD  furosemide (LASIX) 40 MG tablet Take 1 tablet (40 mg total) by mouth daily. 04/03/14  Yes Lavone Orn, MD  HUMULIN 70/30 (70-30) 100 UNIT/ML injection Inject 40-50 Units into the skin 2 (two) times daily.  10/15/14  Yes Historical Provider, MD  HYDROcodone-acetaminophen (NORCO/VICODIN) 5-325 MG per tablet Take 1 tablet by mouth 2 (two) times daily  as needed for severe pain. 11/14/14  Yes Everlene Balls, MD  lisinopril (PRINIVIL,ZESTRIL) 5 MG tablet Take 5 mg by mouth daily. 09/28/14  Yes Historical Provider, MD  metoprolol (LOPRESSOR) 50 MG tablet Take 100 mg by mouth daily.   Yes Historical Provider, MD  omeprazole (PRILOSEC) 20 MG capsule Take 20 mg by mouth daily.   Yes Historical Provider, MD  oxybutynin (DITROPAN) 5 MG tablet Take 5 mg by mouth 3 (three) times daily as needed for bladder spasms.   Yes Historical Provider, MD  Sennosides 25 MG TABS Take 25 mg by mouth daily as needed (for constipation).   Yes Historical Provider, MD  lacosamide 150 MG TABS Take 1 tablet (150 mg total) by mouth 2 (two) times daily. 11/19/14   Debbe Odea, MD  metoprolol tartrate (LOPRESSOR) 25 MG tablet Take  1 tablet (25 mg total) by mouth 2 (two) times daily. 04/03/14   Lavone Orn, MD  nitroGLYCERIN (NITROSTAT) 0.4 MG SL tablet Place 0.4 mg under the tongue every 5 (five) minutes as needed for chest pain.    Historical Provider, MD     History   Social History  . Marital Status: Widowed    Spouse Name: N/A  . Number of Children: N/A  . Years of Education: N/A   Occupational History  . Retired    Social History Main Topics  . Smoking status: Former Smoker -- 2.00 packs/day for 20 years    Types: Cigarettes    Quit date: 09/10/1976  . Smokeless tobacco: Never Used  . Alcohol Use: 0.0 oz/week     Comment: FORMER ALCOHOLIC; "sober since 123XX123"  . Drug Use: No  . Sexual Activity: No   Other Topics Concern  . Not on file   Social History Narrative    Family Status  Relation Status Death Age  . Father Deceased     DM/DVT  . Mother Deceased     old age  . Brother Alive   . Brother Alive    Family History  Problem Relation Age of Onset  . Hypertension Mother   . Diabetes type I Father   . Cancer Brother      Testicular Cancer  . Cancer Brother     Leukemia     ROS:  Full 14 point review of systems complete and found to be  negative unless listed above.  Physical Exam: Blood pressure 145/61, pulse 63, temperature 98.6 F (37 C), temperature source Oral, resp. rate 13, height 6\' 1"  (1.854 m), weight 341 lb (154.677 kg), SpO2 98 %.  General: Well developed, well nourished, male in no acute distress Head: Eyes PERRLA, No xanthomas. Normocephalic and atraumatic, oropharynx without edema or exudate. Dentition: good. Lungs: CTA bilaterally Heart: HRRR S1 S2, no rub/gallop,  Grade 2 murmur best heard over aortic area . pulses are 2+ in bil upper extrem and 1+ in LE.    Neck: No carotid bruits. No lymphadenopathy.  No JVD. Abdomen: Bowel sounds present, abdomen soft and non-tender without masses or hernias noted. Msk:  No weakness, no joint deformities or effusions. Extremities: No clubbing or cyanosis. 1+ pitting edema to LE.  Neuro: Alert and oriented X 3. No focal deficits noted.  Psych:  Slow to answer questions, at end of interview he became "unresponsive", eyes were open, lasted ~ 5 seconds and then he spontaneously came to, shook his head and started looking around. Skin: No rashes or lesions noted.  Labs:   Lab Results  Component Value Date   WBC 8.3 12/07/2014   HGB 13.6 12/07/2014   HCT 40.0 12/07/2014   MCV 88.0 12/07/2014   PLT 188 12/07/2014    Recent Labs Lab 12/07/14 1653  NA 139  K 3.5  CL 100  BUN 11  CREATININE 0.70  GLUCOSE 198*    Recent Labs  12/07/14 1621  TROPIPOC 0.01   ECG:  NSR, 70.   Radiology:  Dg Chest Port 1 View  12/07/2014   CLINICAL DATA:  Chest pain.  EXAM: PORTABLE CHEST - 1 VIEW  COMPARISON:  11/14/2014  FINDINGS: Heart size and pulmonary vascularity are normal and the lungs are clear. There is tortuosity of the thoracic aorta. No acute osseous abnormalities.  IMPRESSION: No acute disease.   Electronically Signed   By: Lorriane Shire M.D.   On: 12/07/2014 16:17  ASSESSMENT AND PLAN:   The patient was seen today by Dr. Marlou Porch, the patient evaluated and the  data reviewed.   Active Problems:  Chest pain: - Initial enzymes are negative, ECG is without acute changes. - Observe overnight and cycle cardiac enzymes - Follow on telemetry - Continue home medications - If Cardiac enzymes remain negative, possible discharge in a.m., to follow up as an outpatient - Nothing by mouth after midnight in case cardiac enzymes become elevated    Diabetes - Continue home insulin dosing with sliding scale    Lower extremity edema - He has some lower extremity edema but does not appear acutely volume overloaded - Chest x-ray is without acute disease - Follow daily weights  Signed: Rosaria Ferries, PA-C 12/07/2014 8:30 PM Beeper [phone]  Personally seen and examined. Agree with above. Difficult to obtain clear story from him. Affect changes through exam. RRR with 2/6 SM (mild AS), mild LE edema.  Has had multiple ER visits. Diarrhea last chief complaint. On Bentyl.   Prior cath resulted in medical mgt. RCA dz noted.  Would not pursue ischemic eval unless Troponin becomes +. Last stress test limited by weight. ? Inferior wall hypokinesis with decreased EF on NUC however echo shows normal EF.   During interview had an episode at the end where he stared at the wall for 10 seconds. Non verbal. He has been seen by neurology recently. Has outpatient visit as well in future with them.   If Trop normal overnight would encourage discharge. Other possibilities for chest pain include MSK, anxiety, GI.   Kelby Adell, MD  This note is a history and physical. Candee Furbish, MD

## 2014-12-07 NOTE — ED Notes (Signed)
Per EMS: Pt had CP this morning around 8am that lasted approx 1 hour and went away.  At approx. 1:30pm pt began to have CP again that radiated to left arm, 8/10.  Pt was SR on monitor during transport and received 324 ASA and 3 nitro without any relief.  Pt refused a fourth.  Hx of CAD, hypertension, hyperlipidemia, CVA with remaining right sided weakness, V-tach, DM, GERD, and has an indwelling catheter in place.  Pt alert and oriented in room at this time.

## 2014-12-07 NOTE — ED Notes (Signed)
Pt placed on bedpan because he felt he needed to have a BM

## 2014-12-07 NOTE — ED Provider Notes (Signed)
CSN: [id number]     Arrival date & time 12/07/14  1519 History   First MD Initiated Contact with Patient 12/07/14 1520     Chief Complaint  Patient presents with  . Chest Pain     (Consider location/radiation/quality/duration/timing/severity/associated sxs/prior Treatment) Patient is a 69 y.o. male presenting with chest pain.  Chest Pain Associated symptoms: nausea and numbness    complains of anterior chest pain described as pressure nonradiating onset 8 AM today lasted 1 hour resolve spontaneously without treatment. Patient had a second episode which started at 1 PM today while at rest. EMS was called EMS treated patient with 4 baby aspirin's and sublingual nitroglycerin, without relief. Presently describes discomfort is moderate anterior and pressure-like coming symptoms include nausea and cold sweats he denies shortness of breath nothing makes symptoms better or worse. Presently discomfort is moderate Patient reports he was scheduled for cardiology visit as outpatient however he could not make the visit due to his inability to drive.  Past Medical History  Diagnosis Date  . Obese   . Hypertension   . Diabetic neuropathy, painful   . Chronic indwelling Foley catheter   . Bladder spasms   . Hyperlipidemia   . GERD (gastroesophageal reflux disease)   . Neurogenic bladder   . CAD (coronary artery disease)   . PONV (postoperative nausea and vomiting)   . CHF (congestive heart failure)   . DVT (deep venous thrombosis) 1980's    LLE  . Type II diabetes mellitus   . Diabetic diarrhea   . Pneumonia 1999  . OSA (obstructive sleep apnea)     "they wanted me to wear a mask; I couldn't" (11/10/2014)  . Arthritis     "hands, ankles, knees" (11/10/2014)  . Chronic lower back pain   . Stroke 10/2011; 06/2014    right hand numbness/notes 10/20/2011, pt not sure he had stroke in 2013; "right hand weaker and right face not quite right since" (11/10/2014)   Past Surgical History  Procedure  Laterality Date  . Gastroplasty    . Total knee arthroplasty Right 1990's  . Left heart catheterization with coronary angiogram N/A 10/24/2011    Procedure: LEFT HEART CATHETERIZATION WITH CORONARY ANGIOGRAM;  Surgeon: Candee Furbish, MD;  Location: [facility] CATH LAB;  Service: Cardiovascular;  Laterality: N/A;  right radial artery approach  . Cholecystectomy open  1970's?  . Joint replacement    . Carpal tunnel release Bilateral ~ 2003-2004   Family History  Problem Relation Age of Onset  . Hypertension Mother   . Diabetes type I Father   . Cancer Brother      Testicular Cancer  . Cancer Brother     Leukemia   History  Substance Use Topics  . Smoking status: Former Smoker -- 2.00 packs/day for 20 years    Types: Cigarettes    Quit date: 09/10/1976  . Smokeless tobacco: Never Used  . 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  Gastrointestinal: Positive for nausea.  Genitourinary:       Chronic indwelling Foley  Musculoskeletal: Positive for gait problem.  Skin: Positive for wound.       Wound on right heel  Neurological: Positive for numbness.       No feeling in feet, bilaterally due to peripheral neuropathy. Walks with some difficulty mostly wheelchair-bound  All other systems reviewed and are negative.  Allergies  Ace inhibitors; Lipitor; Metformin and related; Cefadroxil; Cephalexin; Morphine and related; and Robaxin  Home Medications   Prior to Admission medications   Medication Sig Start Date End Date Taking? Authorizing Provider  aspirin EC 81 MG tablet Take 81 mg by mouth every morning.     Historical Provider, MD  calcium carbonate (TUMS - DOSED IN MG ELEMENTAL CALCIUM) 500 MG chewable tablet Chew 1 tablet (200 mg of elemental calcium total) by mouth 3 (three) times daily as needed for indigestion or heartburn. 11/19/14   Debbe Odea, MD  dicyclomine (BENTYL) 10  MG capsule Take 1 capsule (10 mg total) by mouth 3 (three) times daily before meals. 11/19/14   Debbe Odea, MD  diphenhydrAMINE (SOMINEX) 25 MG tablet Take 25 mg by mouth at bedtime as needed for sleep.    Historical Provider, MD  diphenoxylate-atropine (LOMOTIL) 2.5-0.025 MG per tablet Take 1 tablet by mouth 3 (three) times daily as needed. For diarrhea 09/06/14   Tiffany L Reed, DO  fexofenadine (ALLEGRA) 180 MG tablet Take 180 mg by mouth at bedtime as needed for allergies.     Historical Provider, MD  furosemide (LASIX) 40 MG tablet Take 1 tablet (40 mg total) by mouth daily. 04/03/14   Lavone Orn, MD  HUMULIN 70/30 (70-30) 100 UNIT/ML injection Inject 50 Units into the skin 2 (two) times daily. 10/15/14   Historical Provider, MD  HYDROcodone-acetaminophen (NORCO/VICODIN) 5-325 MG per tablet Take 1 tablet by mouth 2 (two) times daily as needed for severe pain. 11/14/14   Everlene Balls, MD  isosorbide mononitrate (IMDUR) 30 MG 24 hr tablet Take 1 tablet (30 mg total) by mouth daily. 08/31/14   Costin Karlyne Greenspan, MD  lacosamide 150 MG TABS Take 1 tablet (150 mg total) by mouth 2 (two) times daily. 11/19/14   Debbe Odea, MD  lisinopril (PRINIVIL,ZESTRIL) 5 MG tablet Take 5 mg by mouth daily. 09/28/14   Historical Provider, MD  metoprolol tartrate (LOPRESSOR) 25 MG tablet Take 1 tablet (25 mg total) by mouth 2 (two) times daily. 04/03/14   Lavone Orn, MD  nitroGLYCERIN (NITROSTAT) 0.4 MG SL tablet Place 0.4 mg under the tongue every 5 (five) minutes as needed for chest pain.    Historical Provider, MD  omeprazole (PRILOSEC) 20 MG capsule Take 20 mg by mouth daily.    Historical Provider, MD  oxybutynin (DITROPAN) 5 MG tablet Take 5 mg by mouth 3 (three) times daily as needed for bladder spasms.    Historical Provider, MD  Sennosides 25 MG TABS Take 25 mg by mouth daily as needed (for constipation).    Historical Provider, MD   SpO2 98% Physical Exam  Constitutional:  Chronically ill-appearing  HENT:   Head: Normocephalic and atraumatic.  Eyes: Conjunctivae are normal. Pupils are equal, round, and reactive to light.  Neck: Neck supple. No tracheal deviation present. No thyromegaly present.  Cardiovascular: Normal rate and regular rhythm.   No murmur heard. Pulmonary/Chest: Effort normal and breath sounds normal.  Abdominal: Soft. Bowel sounds are normal. He exhibits no distension. There is no tenderness.  Obese  Musculoskeletal: Normal range of motion. He exhibits edema. He exhibits no tenderness.  1+ pretibial pitting edema bilaterally. 1 cm x 0.5 cm open wound overlying right posterior heel., Clean-appearing  Neurological: He is alert. Coordination normal.  Skin: Skin is warm and dry. No rash noted.  Psychiatric: He has a normal mood and affect.  Nursing note and vitals reviewed.   ED Course  Procedures (  including critical care time) Labs Review Labs Reviewed - No data to display  Imaging Review No results found.   EKG Interpretation None     ED ECG REPORT   Date: 12/07/2014  Rate: 75  Rhythm: normal sinus rhythm  QRS Axis: left  Intervals: normal  ST/T Wave abnormalities: nonspecific T wave changes  Conduction Disutrbances:none  Narrative Interpretation:   Old EKG Reviewed: No significant change from 11/10/2014 interpreted by me  I have personally reviewed the EKG tracing and disagree with the computerized printout as noted. No left bundle branch block  5 PM discomfort minimal after treatment with intravenous fentanyl Chest x-ray viewed by me Results for orders placed or performed during the hospital encounter of 12/07/14  CBC with Differential/Platelet  Result Value Ref Range   WBC 8.3 4.0 - 10.5 K/uL   RBC 4.41 4.22 - 5.81 MIL/uL   Hemoglobin 13.0 13.0 - 17.0 g/dL   HCT 38.8 (L) 39.0 - 52.0 %   MCV 88.0 78.0 - 100.0 fL   MCH 29.5 26.0 - 34.0 pg   MCHC 33.5 30.0 - 36.0 g/dL   RDW 13.6 11.5 - 15.5 %   Platelets 188 150 - 400 K/uL   Neutrophils Relative %  68 43 - 77 %   Neutro Abs 5.6 1.7 - 7.7 K/uL   Lymphocytes Relative 20 12 - 46 %   Lymphs Abs 1.6 0.7 - 4.0 K/uL   Monocytes Relative 6 3 - 12 %   Monocytes Absolute 0.5 0.1 - 1.0 K/uL   Eosinophils Relative 5 0 - 5 %   Eosinophils Absolute 0.5 0.0 - 0.7 K/uL   Basophils Relative 1 0 - 1 %   Basophils Absolute 0.1 0.0 - 0.1 K/uL  I-stat troponin, ED  Result Value Ref Range   Troponin i, poc 0.01 0.00 - 0.08 ng/mL   Comment 3          I-stat chem 8, ed  Result Value Ref Range   Sodium 139 135 - 145 mmol/L   Potassium 3.5 3.5 - 5.1 mmol/L   Chloride 100 96 - 112 mmol/L   BUN 11 6 - 23 mg/dL   Creatinine, Ser 0.70 0.50 - 1.35 mg/dL   Glucose, Bld 198 (H) 70 - 99 mg/dL   Calcium, Ion 1.09 (L) 1.13 - 1.30 mmol/L   TCO2 23 0 - 100 mmol/L   Hemoglobin 13.6 13.0 - 17.0 g/dL   HCT 40.0 39.0 - 52.0 %   Dg Chest 2 View  11/10/2014   CLINICAL DATA:  Shortness of breath and chest pressure on the left today.  EXAM: CHEST  2 VIEW  COMPARISON:  Single view of the chest 08/27/2014. CT chest 09/14/2014.  FINDINGS: There is cardiomegaly without edema. The lungs are clear. No pneumothorax or pleural effusion.  IMPRESSION: Cardiomegaly without acute disease.   Electronically Signed   By: Inge Rise M.D.   On: 11/10/2014 15:22   Ct Head Wo Contrast  11/15/2014   CLINICAL DATA:  Syncope and collapse x 2, history of stroke, diabetes, CHF.  EXAM: CT HEAD WITHOUT CONTRAST  TECHNIQUE: Contiguous axial images were obtained from the base of the skull through the vertex without intravenous contrast.  COMPARISON:  MRI of the brain August 28, 2014 and CT of the head June 25, 2014  FINDINGS: Mild motion degraded examination.  The ventricles and sulci are normal for age. No intraparenchymal hemorrhage, mass effect nor midline shift. Minimal supratentorial white matter hypodensities are within  normal range for patient's age and though non-specific suggest sequelae of chronic small vessel ischemic disease. No  acute large vascular territory infarcts. Symmetric basal ganglia and thalamus mineralization.  No abnormal extra-axial fluid collections. Basal cisterns are patent. Mild calcific atherosclerosis of the carotid siphons and included vertebral arteries.  No skull fracture. The included ocular globes and orbital contents are non-suspicious. Trace paranasal sinus mucosal thickening without air-fluid levels. Patient is edentulous.  IMPRESSION: No acute intracranial process, stable from prior imaging.   Electronically Signed   By: Elon Alas   On: 11/15/2014 01:51   Mr Jodene Nam Head Wo Contrast  11/15/2014   CLINICAL DATA:  Two brief syncopal episodes this evening. Intermittent chest discomfort. Seen earlier in emergency department today for accidental Foley catheter removal. History of diabetic neuropathy, stroke with residual RIGHT-sided weakness, heart failure.  EXAM: MRI HEAD WITHOUT CONTRAST  MRA HEAD WITHOUT CONTRAST  TECHNIQUE: Multiplanar, multiecho pulse sequences of the brain and surrounding structures were obtained without intravenous contrast. Angiographic images of the head were obtained using MRA technique without contrast.  COMPARISON:  CT of the head November 15, 2014 at 10:06 a.m. and MRI/ MRA of the brain August 28, 2014  FINDINGS: MRI HEAD FINDINGS (Mild motion degraded examination)  No reduced diffusion to suggest acute ischemia. No susceptibility artifact is suggests hemorrhage ; vein susceptibility artifact in the bilateral basal ganglia and LEFT greater than RIGHT thalamus corresponding to mineralization.  The ventricles and sulci are normal for patient's age. Punctate T2 hyperintensity in RIGHT cerebellum. Remote LEFT basal ganglia lacunar infarct. Small area of cystic encephalomalacia LEFT posterior median frontal lobe, unchanged. No midline shift, mass effect or mass lesions.  No abnormal extra-axial fluid collections. Ocular globes and orbital contents are unremarkable. Mild paranasal sinus  mucosal thickening without air-fluid levels. Mastoid air cells are well aerated. No abnormal sellar expansion. No cerebellar tonsillar ectopia.  MRA HEAD FINDINGS (moderately motion degraded examination)  Anterior circulation: Normal flow related enhancement of the included cervical, petrous, cavernous and supra clinoid internal carotid arteries. Patent anterior communicating artery. Preserved flow related enhancement of the anterior and middle cerebral arteries, including more distal segments.  Posterior circulation: LEFT vertebral artery is dominant. Basilar artery is patent, with normal flow related enhancement of the main branch vessels. Preserved flow related enhancement of the posterior cerebral arteries.  No large vessel occlusion within the anterior or posterior circulation. Limited assessment of the aneurysm, luminal irregularity due to motion.  IMPRESSION: MRI HEAD: Mild motion degraded examination. No acute intracranial process, specifically no acute ischemia.  Remote LEFT basal ganglia lacunar infarct. Small area LEFT mesial posterior frontal lobe encephalomalacia is likely post ischemic.  MRA HEAD: Moderately motion degraded examination without large vessel occlusion.   Electronically Signed   By: Elon Alas   On: 11/15/2014 03:40   Mr Brain Wo Contrast  11/15/2014   CLINICAL DATA:  Two brief syncopal episodes this evening. Intermittent chest discomfort. Seen earlier in emergency department today for accidental Foley catheter removal. History of diabetic neuropathy, stroke with residual RIGHT-sided weakness, heart failure.  EXAM: MRI HEAD WITHOUT CONTRAST  MRA HEAD WITHOUT CONTRAST  TECHNIQUE: Multiplanar, multiecho pulse sequences of the brain and surrounding structures were obtained without intravenous contrast. Angiographic images of the head were obtained using MRA technique without contrast.  COMPARISON:  CT of the head November 15, 2014 at 10:06 a.m. and MRI/ MRA of the brain August 28, 2014  FINDINGS: MRI HEAD FINDINGS (Mild motion degraded examination)  No reduced diffusion to suggest acute ischemia. No susceptibility artifact is suggests hemorrhage ; vein susceptibility artifact in the bilateral basal ganglia and LEFT greater than RIGHT thalamus corresponding to mineralization.  The ventricles and sulci are normal for patient's age. Punctate T2 hyperintensity in RIGHT cerebellum. Remote LEFT basal ganglia lacunar infarct. Small area of cystic encephalomalacia LEFT posterior median frontal lobe, unchanged. No midline shift, mass effect or mass lesions.  No abnormal extra-axial fluid collections. Ocular globes and orbital contents are unremarkable. Mild paranasal sinus mucosal thickening without air-fluid levels. Mastoid air cells are well aerated. No abnormal sellar expansion. No cerebellar tonsillar ectopia.  MRA HEAD FINDINGS (moderately motion degraded examination)  Anterior circulation: Normal flow related enhancement of the included cervical, petrous, cavernous and supra clinoid internal carotid arteries. Patent anterior communicating artery. Preserved flow related enhancement of the anterior and middle cerebral arteries, including more distal segments.  Posterior circulation: LEFT vertebral artery is dominant. Basilar artery is patent, with normal flow related enhancement of the main branch vessels. Preserved flow related enhancement of the posterior cerebral arteries.  No large vessel occlusion within the anterior or posterior circulation. Limited assessment of the aneurysm, luminal irregularity due to motion.  IMPRESSION: MRI HEAD: Mild motion degraded examination. No acute intracranial process, specifically no acute ischemia.  Remote LEFT basal ganglia lacunar infarct. Small area LEFT mesial posterior frontal lobe encephalomalacia is likely post ischemic.  MRA HEAD: Moderately motion degraded examination without large vessel occlusion.   Electronically Signed   By: Elon Alas    On: 11/15/2014 03:40   Ct Abdomen Pelvis W Contrast  11/17/2014   CLINICAL DATA:  Diarrhea and recent syncopal of the with right-sided abdominal pain  EXAM: CT ABDOMEN AND PELVIS WITH CONTRAST  TECHNIQUE: Multidetector CT imaging of the abdomen and pelvis was performed using the standard protocol following bolus administration of intravenous contrast.  CONTRAST:  195mL OMNIPAQUE IOHEXOL 300 MG/ML  SOLN  COMPARISON:  06/25/2014  FINDINGS: Lung bases demonstrate small right-sided pleural effusion which is new from the prior exam.  The liver, spleen, adrenal glands and pancreas are stable in appearance from prior exam. 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 The gallbladder has been surgically removed. The kidneys are well visualized and demonstrates some cystic change. No obstructive changes or renal calculi are noted. Retro aortic left renal vein is seen.  The appendix is within normal limits. Mild diverticular change of the colon is noted. The bladder is well distended. Some air is noted within the bladder likely related to instrumentation. A Foley catheter is seen although the balloon appears to be lodged within the prostate. This should be deflated and advanced and reinflated. The osseous structures show no acute abnormality. Postsurgical changes are noted in the upper abdomen.  IMPRESSION: Foley catheter balloon appears to lie within the prostate. This should be deflated fat and advanced. Air is noted within the bladder likely related instrumentation and the catheter placement.  Small right-sided pleural effusion.  The remainder of the exam is stable from the prior study.   Electronically Signed   By: Inez Catalina M.D.   On: 11/17/2014 14:42   Dg Chest Port 1 View  12/07/2014   CLINICAL DATA:  Chest pain.  EXAM: PORTABLE CHEST - 1 VIEW  COMPARISON:  11/14/2014  FINDINGS: Heart size and pulmonary vascularity are normal and the lungs are clear. There is tortuosity of the thoracic aorta. No acute osseous  abnormalities.  IMPRESSION: No acute disease.   Electronically Signed  By: Lorriane Shire M.D.   On: 12/07/2014 16:17   Dg Chest Port 1 View  11/14/2014   CLINICAL DATA:  Diarrhea.  Loss of consciousness.  EXAM: PORTABLE CHEST - 1 VIEW  COMPARISON:  11/10/2014  FINDINGS: Cardiac enlargement with mild vascular congestion. Negative for edema or effusion. Negative for infiltrate.  IMPRESSION: Cardiac enlargement with vascular congestion.  Negative for edema.   Electronically Signed   By: Franchot Gallo M.D.   On: 11/14/2014 19:40    MDM  Heart score equals 5 based on risk factors, age and history. Cardiology service consult will evaluate patient in ED. cardiology service to arranged for inpatient stay Final diagnoses:  None   Dx #1 chest pain #2 hyperglcemia     Orlie Dakin, MD 12/07/14 2013

## 2014-12-07 NOTE — ED Notes (Signed)
Secretary spoke to cardiology, Dr. Marlou Porch to see pt shortly.

## 2014-12-07 NOTE — ED Notes (Signed)
Cardiology at bedside.

## 2014-12-08 ENCOUNTER — Other Ambulatory Visit: Payer: Self-pay

## 2014-12-08 DIAGNOSIS — R079 Chest pain, unspecified: Secondary | ICD-10-CM | POA: Diagnosis not present

## 2014-12-08 DIAGNOSIS — Z86718 Personal history of other venous thrombosis and embolism: Secondary | ICD-10-CM

## 2014-12-08 DIAGNOSIS — R269 Unspecified abnormalities of gait and mobility: Secondary | ICD-10-CM | POA: Diagnosis not present

## 2014-12-08 DIAGNOSIS — R110 Nausea: Secondary | ICD-10-CM | POA: Diagnosis not present

## 2014-12-08 DIAGNOSIS — E669 Obesity, unspecified: Secondary | ICD-10-CM | POA: Diagnosis not present

## 2014-12-08 LAB — GLUCOSE, CAPILLARY
GLUCOSE-CAPILLARY: 165 mg/dL — AB (ref 70–99)
Glucose-Capillary: 217 mg/dL — ABNORMAL HIGH (ref 70–99)
Glucose-Capillary: 125 mg/dL — ABNORMAL HIGH (ref 70–99)
Glucose-Capillary: 83 mg/dL (ref 70–99)

## 2014-12-08 LAB — D-DIMER, QUANTITATIVE: D-Dimer, Quant: 0.66 ug/mL-FEU — ABNORMAL HIGH (ref 0.00–0.48)

## 2014-12-08 LAB — TROPONIN I
Troponin I: <0.03 ng/mL (ref <0.031)
Troponin I: <0.03 ng/mL (ref <0.031)

## 2014-12-08 LAB — MRSA PCR SCREENING: MRSA by PCR: POSITIVE — AB

## 2014-12-08 MED ORDER — NITROGLYCERIN 0.4 MG SL SUBL: 0.4000 mg | SUBLINGUAL_TABLET | SUBLINGUAL | Status: DC | PRN

## 2014-12-08 MED ORDER — ENOXAPARIN SODIUM 150 MG/ML ~~LOC~~ SOLN
150.0000 mg | Freq: Two times a day (BID) | SUBCUTANEOUS | Status: DC
  Administered 2014-12-08 – 2014-12-09 (×2): 150 mg via SUBCUTANEOUS
  Filled 2014-12-08 (×2): qty 1

## 2014-12-08 MED ORDER — MUPIROCIN 2 % EX OINT
1.0000 "application " | TOPICAL_OINTMENT | Freq: Two times a day (BID) | CUTANEOUS | Status: DC
  Administered 2014-12-08 – 2014-12-09 (×4): 1 via NASAL
  Filled 2014-12-08: qty 22

## 2014-12-08 MED ORDER — METOPROLOL TARTRATE 50 MG PO TABS: 50.0000 mg | ORAL_TABLET | Freq: Two times a day (BID) | ORAL | Status: DC

## 2014-12-08 MED ORDER — ACETAMINOPHEN 325 MG PO TABS: 650.0000 mg | ORAL_TABLET | ORAL | Status: DC | PRN

## 2014-12-08 MED ORDER — CHLORHEXIDINE GLUCONATE CLOTH 2 % EX PADS
6.0000 | MEDICATED_PAD | Freq: Every day | CUTANEOUS | Status: DC
  Administered 2014-12-08 – 2014-12-09 (×2): 6 via TOPICAL

## 2014-12-08 NOTE — Progress Notes (Signed)
UR completed 

## 2014-12-08 NOTE — Discharge Summary (Signed)
Patient ID: [name],  MRN: [id number], DOB/AGE: 1945/10/05 69 y.o.  Admit date: 12/07/2014 Discharge date: 12/08/2014  Primary Care Provider: Irven Shelling, MD Primary Cardiologist: Dr Marlou Porch  Discharge Diagnoses Active Problems:   Chest pain with moderate risk of acute coronary syndrome   DM (diabetes mellitus) with complications   CAD- known RCA disease- medical Rx   Obesity- BMI 49   Physical deconditioning   History of DVT (deep vein thrombosis)   Diabetic neuropathy   OSA (obstructive sleep apnea)   Hypertension   Chronic indwelling Foley catheter   Chest pain    Procedures: LE venous doppler   Hospital Course:  69 y.o. year old male with a history of HTN, HLD, Chronic diastolic HF-Echo [id number] (EF of [id number], grade 1 diastolic dysfunction, mild aortic stenosis, and trivial mitral regurgitation).   LHC 10/2011 (significant distal right coronary artery disease, diffuse, long segment up to 90% at the bifurcation of a small PDA branch. 50% stenosis of the proximal large first diagonal branch. Elevated left ventricular end-diastolic pressure of 26 mmHg consistent with diastolic dysfunction). Given the diffuse, long nature of his distal right coronary region, percutaneous intervention to this region portends a high risk to benefit ratio. It was elected to proceed with medical therapy.   Other history includes prior LE DVT, obesity, deconditioning, stroke (with residual right-sided weakness), diabetes type 2, diabetic neuropathy, neurogenic bladder with a chronic indwelling Foley catheter, and obstructive sleep apnea (not on CPAP).   He was admitted 12/07/14 with multiple complaints-one of which was chest pain. He ruled out for an MI by Troponin. The next day he complained of Rt calf pain "I think I have a DVT". D Dimer was positive and venous dopplers were done before discharge, these were negative for DVT and the pt was discharged home 12/09/14.  Discharge Vitals:    Blood pressure 128/61, pulse 61, temperature 98.2 F (36.8 C), temperature source Oral, resp. rate 18, height 6\' 1"  (1.854 m), weight 341 lb 6.4 oz (154.858 kg), SpO2 95 %.    Labs: Results for orders placed or performed during the hospital encounter of 12/07/14 (from the past 24 hour(s))  CBC with Differential/Platelet     Status: Abnormal   Collection Time: 12/07/14  4:09 PM  Result Value Ref Range   WBC 8.3 4.0 - 10.5 K/uL   RBC 4.41 4.22 - 5.81 MIL/uL   Hemoglobin 13.0 13.0 - 17.0 g/dL   HCT 38.8 (L) 39.0 - 52.0 %   MCV 88.0 78.0 - 100.0 fL   MCH 29.5 26.0 - 34.0 pg   MCHC 33.5 30.0 - 36.0 g/dL   RDW 13.6 11.5 - 15.5 %   Platelets 188 150 - 400 K/uL   Neutrophils Relative % 68 43 - 77 %   Neutro Abs 5.6 1.7 - 7.7 K/uL   Lymphocytes Relative 20 12 - 46 %   Lymphs Abs 1.6 0.7 - 4.0 K/uL   Monocytes Relative 6 3 - 12 %   Monocytes Absolute 0.5 0.1 - 1.0 K/uL   Eosinophils Relative 5 0 - 5 %   Eosinophils Absolute 0.5 0.0 - 0.7 K/uL   Basophils Relative 1 0 - 1 %   Basophils Absolute 0.1 0.0 - 0.1 K/uL  I-stat troponin, ED     Status: None   Collection Time: 12/07/14  4:21 PM  Result Value Ref Range   Troponin i, poc 0.01 0.00 - 0.08 ng/mL   Comment 3  I-stat chem 8, ed     Status: Abnormal   Collection Time: 12/07/14  4:53 PM  Result Value Ref Range   Sodium 139 135 - 145 mmol/L   Potassium 3.5 3.5 - 5.1 mmol/L   Chloride 100 96 - 112 mmol/L   BUN 11 6 - 23 mg/dL   Creatinine, Ser 0.70 0.50 - 1.35 mg/dL   Glucose, Bld 198 (H) 70 - 99 mg/dL   Calcium, Ion 1.09 (L) 1.13 - 1.30 mmol/L   TCO2 23 0 - 100 mmol/L   Hemoglobin 13.6 13.0 - 17.0 g/dL   HCT 40.0 39.0 - 52.0 %  Glucose, capillary     Status: Abnormal   Collection Time: 12/07/14 10:06 PM  Result Value Ref Range   Glucose-Capillary 166 (H) 70 - 99 mg/dL  Troponin I-serum (0, 3, 6 hours)     Status: None   Collection Time: 12/07/14 10:38 PM  Result Value Ref Range   Troponin I <0.03 <0.031 ng/mL   MRSA PCR Screening     Status: Abnormal   Collection Time: 12/07/14 11:24 PM  Result Value Ref Range   MRSA by PCR POSITIVE (A) NEGATIVE  Troponin I-serum (0, 3, 6 hours)     Status: None   Collection Time: 12/08/14  3:21 AM  Result Value Ref Range   Troponin I <0.03 <0.031 ng/mL  Glucose, capillary     Status: Abnormal   Collection Time: 12/08/14  7:28 AM  Result Value Ref Range   Glucose-Capillary 217 (H) 70 - 99 mg/dL   Comment 1 Notify RN    Comment 2 Document in Chart   Troponin I-serum (0, 3, 6 hours)     Status: None   Collection Time: 12/08/14  9:22 AM  Result Value Ref Range   Troponin I <0.03 <0.031 ng/mL  D-dimer, quantitative     Status: Abnormal   Collection Time: 12/08/14  9:22 AM  Result Value Ref Range   D-Dimer, Quant 0.66 (H) 0.00 - 0.48 ug/mL-FEU  Glucose, capillary     Status: Abnormal   Collection Time: 12/08/14 12:11 PM  Result Value Ref Range   Glucose-Capillary 165 (H) 70 - 99 mg/dL   Comment 1 Notify RN    Comment 2 Document in Chart     Disposition:  Follow-up Information    Follow up with Candee Furbish, MD.   Specialty:  Cardiology   Why:  office will call you   Contact information:   [address] [address] [phone]       Discharge Medications:    Medication List    TAKE these medications        acetaminophen 325 MG tablet  Commonly known as:  TYLENOL  Take 2 tablets (650 mg total) by mouth every 4 (four) hours as needed for headache or mild pain.     aspirin EC 81 MG tablet  Take 81 mg by mouth every morning.     calcium carbonate 500 MG chewable tablet  Commonly known as:  TUMS - dosed in mg elemental calcium  Chew 1 tablet (200 mg of elemental calcium total) by mouth 3 (three) times daily as needed for indigestion or heartburn.     dicyclomine 10 MG capsule  Commonly known as:  BENTYL  Take 1 capsule (10 mg total) by mouth 3 (three) times daily before meals.     diphenhydrAMINE 25 MG  tablet  Commonly known as:  SOMINEX  Take 25 mg  by mouth at bedtime as needed for sleep.     diphenoxylate-atropine 2.5-0.025 MG per tablet  Commonly known as:  LOMOTIL  Take 1 tablet by mouth 3 (three) times daily as needed. For diarrhea     fexofenadine 180 MG tablet  Commonly known as:  ALLEGRA  Take 180 mg by mouth at bedtime as needed for allergies.     furosemide 40 MG tablet  Commonly known as:  LASIX  Take 1 tablet (40 mg total) by mouth daily.     HUMULIN 70/30 (70-30) 100 UNIT/ML injection  Generic drug:  insulin NPH-regular Human  Inject 40-50 Units into the skin 2 (two) times daily.     HYDROcodone-acetaminophen 5-325 MG per tablet  Commonly known as:  NORCO/VICODIN  Take 1 tablet by mouth 2 (two) times daily as needed for severe pain.     Lacosamide 150 MG Tabs  Take 1 tablet (150 mg total) by mouth 2 (two) times daily.     lisinopril 5 MG tablet  Commonly known as:  PRINIVIL,ZESTRIL  Take 5 mg by mouth daily.     metoprolol 50 MG tablet  Commonly known as:  LOPRESSOR  Take 1 tablet (50 mg total) by mouth 2 (two) times daily.     nitroGLYCERIN 0.4 MG SL tablet  Commonly known as:  NITROSTAT  Place 1 tablet (0.4 mg total) under the tongue every 5 (five) minutes as needed for chest pain.     omeprazole 20 MG capsule  Commonly known as:  PRILOSEC  Take 20 mg by mouth daily.     oxybutynin 5 MG tablet  Commonly known as:  DITROPAN  Take 5 mg by mouth 3 (three) times daily as needed for bladder spasms.     Sennosides 25 MG Tabs  Take 25 mg by mouth daily as needed (for constipation).         Duration of Discharge Encounter: Greater than 30 minutes including physician time.  Signed, Kerin Ransom PA-C 12/08/2014 1:51 PM   Patient seen and examined.  Plan as discussed in my rounding note for today and outlined above. Jeneen Rinks [facility]  12/09/2014  10:51 AM

## 2014-12-08 NOTE — Progress Notes (Signed)
ANTICOAGULATION CONSULT NOTE - Initial Consult  Pharmacy Consult for Lovenox Indication: r/o DVT  Allergies  Allergen Reactions  . Ace Inhibitors Swelling    Pt tolerates lisinopril  . Lipitor [Atorvastatin Calcium] Swelling  . Metformin And Related Swelling  . Cefadroxil Hives  . Cephalexin Hives  . Morphine And Related Other (See Comments)    Sweating, feels like is "in rocky boat."  . Robaxin [Methocarbamol] Other (See Comments)    Feels like he is shaky    Patient Measurements: Height: 6\' 1"  (185.4 cm) Weight: (!) 341 lb 6.4 oz (154.858 kg) IBW/kg (Calculated) : 79.9  Vital Signs: Temp: 98.1 F (36.7 C) (03/30 1556) Temp Source: Oral (03/30 1556) BP: 122/54 mmHg (03/30 1556) Pulse Rate: 64 (03/30 1556)  Labs:  Recent Labs  12/07/14 1609 12/07/14 1653 12/07/14 2238 12/08/14 0321 12/08/14 0922  HGB 13.0 13.6  --   --   --   HCT 38.8* 40.0  --   --   --   PLT 188  --   --   --   --   CREATININE  --  0.70  --   --   --   TROPONINI  --   --  <0.03 <0.03 <0.03    Estimated Creatinine Clearance: 139.3 mL/min (by C-G formula based on Cr of 0.7).   Medical History: Past Medical History  Diagnosis Date  . Obese   . Hypertension   . Diabetic neuropathy, painful   . Chronic indwelling Foley catheter   . Bladder spasms   . Hyperlipidemia   . GERD (gastroesophageal reflux disease)   . Neurogenic bladder   . CAD (coronary artery disease)   . PONV (postoperative nausea and vomiting)   . CHF (congestive heart failure)   . DVT (deep venous thrombosis) 1980's    LLE  . Type II diabetes mellitus   . Diabetic diarrhea   . Pneumonia 1999  . OSA (obstructive sleep apnea)     "they wanted me to wear a mask; I couldn't" (11/10/2014)  . Arthritis     "hands, ankles, knees" (11/10/2014)  . Chronic lower back pain   . Stroke 10/2011; 06/2014    right hand numbness/notes 10/20/2011, pt not sure he had stroke in 2013; "right hand weaker and right face not quite right since"  (11/10/2014)    Medications:  Prescriptions prior to admission  Medication Sig Dispense Refill Last Dose  . aspirin EC 81 MG tablet Take 81 mg by mouth every morning.    12/07/2014 at Unknown time  . calcium carbonate (TUMS - DOSED IN MG ELEMENTAL CALCIUM) 500 MG chewable tablet Chew 1 tablet (200 mg of elemental calcium total) by mouth 3 (three) times daily as needed for indigestion or heartburn.   Past Month at Unknown time  . dicyclomine (BENTYL) 10 MG capsule Take 1 capsule (10 mg total) by mouth 3 (three) times daily before meals. 90 capsule 0 Past Week at Unknown time  . diphenhydrAMINE (SOMINEX) 25 MG tablet Take 25 mg by mouth at bedtime as needed for sleep.   Past Week at Unknown time  . diphenoxylate-atropine (LOMOTIL) 2.5-0.025 MG per tablet Take 1 tablet by mouth 3 (three) times daily as needed. For diarrhea 90 tablet 0 12/06/2014 at Unknown time  . fexofenadine (ALLEGRA) 180 MG tablet Take 180 mg by mouth at bedtime as needed for allergies.    12/06/2014 at Unknown time  . furosemide (LASIX) 40 MG tablet Take 1 tablet (40 mg total)  by mouth daily. 30 tablet 11 12/07/2014 at Unknown time  . HUMULIN 70/30 (70-30) 100 UNIT/ML injection Inject 40-50 Units into the skin 2 (two) times daily.   12 12/07/2014 at Unknown time  . HYDROcodone-acetaminophen (NORCO/VICODIN) 5-325 MG per tablet Take 1 tablet by mouth 2 (two) times daily as needed for severe pain. 6 tablet 0 Past Month at Unknown time  . lisinopril (PRINIVIL,ZESTRIL) 5 MG tablet Take 5 mg by mouth daily.  3 12/07/2014 at Unknown time  . omeprazole (PRILOSEC) 20 MG capsule Take 20 mg by mouth daily.   12/07/2014 at Unknown time  . oxybutynin (DITROPAN) 5 MG tablet Take 5 mg by mouth 3 (three) times daily as needed for bladder spasms.   Past Week at Unknown time  . Sennosides 25 MG TABS Take 25 mg by mouth daily as needed (for constipation).   12/06/2014 at Unknown time  . [DISCONTINUED] metoprolol (LOPRESSOR) 50 MG tablet Take 100 mg by mouth  daily.   12/07/2014 at 8a  . lacosamide 150 MG TABS Take 1 tablet (150 mg total) by mouth 2 (two) times daily. 60 tablet 0   . metoprolol tartrate (LOPRESSOR) 25 MG tablet Take 1 tablet (25 mg total) by mouth 2 (two) times daily. 60 tablet 11 11/14/2014 at 0700  . [DISCONTINUED] nitroGLYCERIN (NITROSTAT) 0.4 MG SL tablet Place 0.4 mg under the tongue every 5 (five) minutes as needed for chest pain.   unknown    Assessment: 69 y.o. male presents with CP. Awaiting dopplers to r/o DVT. To initiate full-dose Lovenox until dopplers can be done. Noted pt with remote history of DVT (1980s). SCr 0.7, est CrCl > 100 ml/min. CBC stable at baseline. Pt received Lovenox 40mg  last night at 2300.  Goal of Therapy:  Anti-Xa level 0.6-1 units/ml 4hrs after LMWH dose given Monitor platelets by anticoagulation protocol: Yes   Plan:  Lovenox 150mg  SQ q12h CBC q72h while on Lovenox Will f/u dopplers   Sherlon Handing, PharmD, BCPS Clinical pharmacist, pager [phone] 12/08/2014,5:34 PM

## 2014-12-08 NOTE — Progress Notes (Signed)
    Subjective:  No chest pain this am but he thinks he has a DVT in his Rt leg, he has had this before though venous dopplers negative in Oct and D dimer neg in 2011.   Objective:  Vital Signs in the last 24 hours: Temp:  [96.9 F (36.1 C)-98.6 F (37 C)] 97.4 F (36.3 C) (03/30 0500) Pulse Rate:  [49-81] 81 (03/30 0514) Resp:  [11-24] 18 (03/30 0114) BP: (114-159)/(47-79) 140/79 mmHg (03/30 0514) SpO2:  [94 %-100 %] 94 % (03/30 0514) Weight:  [341 lb (154.677 kg)-341 lb 6.4 oz (154.858 kg)] 341 lb 6.4 oz (154.858 kg) (03/29 2124)  Intake/Output from previous day:  Intake/Output Summary (Last 24 hours) at 12/08/14 0803 Last data filed at 12/08/14 0538  Gross per 24 hour  Intake    480 ml  Output   2950 ml  Net  -2470 ml    Physical Exam: General appearance: alert, cooperative, no distress and morbidly obese Lungs: clear to auscultation bilaterally Heart: regular rate and rhythm Extremities: chronic LE edema, positive calf tenderness on Rt, his Lt leg is actually than his Rt   Rate: 78  Rhythm: normal sinus rhythm and premature atrial contractions (PAC)  Lab Results:  Recent Labs  12/07/14 1609 12/07/14 1653  WBC 8.3  --   HGB 13.0 13.6  PLT 188  --     Recent Labs  12/07/14 1653  NA 139  K 3.5  CL 100  GLUCOSE 198*  BUN 11  CREATININE 0.70    Recent Labs  12/07/14 2238 12/08/14 0321  TROPONINI <0.03 <0.03   No results for input(s): INR in the last 72 hours.  Imaging: Imaging results have been reviewed   Assessment/Plan:  69 y.o. Morbidly obese, diabetic, disabled male with CAD- medical Rx, last Myoview May 2015, past history of DVTs, admitted with chest pain.  Active Problems:   Chest pain with moderate risk of acute coronary syndrome   DM (diabetes mellitus) with complications   CAD- known RCA disease- medical Rx   Obesity- BMI 49   Physical deconditioning   History of DVT (deep vein thrombosis)   Diabetic neuropathy   OSA  (obstructive sleep apnea)   Hypertension   Chronic indwelling Foley catheter   Chest pain   PLAN: Troponin negative, check D dimer if negative discharge.   Kerin Ransom PA-C Beeper [id number] 12/08/2014, 8:03 AM Patient seen and examined. I agree with the assessment and plan as detailed above. See also my additional thoughts below.   D dimers to be checked today. If it is negative, the patient will be stable for discharge.  Dola Argyle, MD, [facility] 12/08/2014 10:07 AM

## 2014-12-08 NOTE — Discharge Instructions (Signed)

## 2014-12-08 NOTE — Progress Notes (Signed)
Pt has not had dopplers yet (ordered this am). The pt expressed concern, indicating he is concerned he has a DVT. I have ordered full dose Lovenox till dopplers can be done.  Kerin Ransom PA-C 12/08/2014 5:30 PM Ron Parker

## 2014-12-09 DIAGNOSIS — M79609 Pain in unspecified limb: Secondary | ICD-10-CM | POA: Diagnosis not present

## 2014-12-09 DIAGNOSIS — R079 Chest pain, unspecified: Secondary | ICD-10-CM | POA: Diagnosis not present

## 2014-12-09 LAB — CBC
HCT: 39.5 % (ref 39.0–52.0)
Hemoglobin: 13.3 g/dL (ref 13.0–17.0)
MCH: 29.7 pg (ref 26.0–34.0)
MCHC: 33.7 g/dL (ref 30.0–36.0)
MCV: 88.2 fL (ref 78.0–100.0)
PLATELETS: 185 10*3/uL (ref 150–400)
RBC: 4.48 MIL/uL (ref 4.22–5.81)
RDW: 13.8 % (ref 11.5–15.5)
WBC: 7.4 10*3/uL (ref 4.0–10.5)

## 2014-12-09 LAB — GLUCOSE, CAPILLARY
Glucose-Capillary: 116 mg/dL — ABNORMAL HIGH (ref 70–99)
Glucose-Capillary: 125 mg/dL — ABNORMAL HIGH (ref 70–99)

## 2014-12-09 NOTE — Plan of Care (Signed)
Problem: Discharge Progression Outcomes Goal: Barriers To Progression Addressed/Resolved Outcome: Completed/Met Date Met:  12/09/14 Dopplers show no DVT

## 2014-12-09 NOTE — Progress Notes (Signed)
UR completed 

## 2014-12-09 NOTE — Progress Notes (Signed)
*  PRELIMINARY RESULTS* Vascular Ultrasound Lower extremity venous duplex has been completed.  Preliminary findings: Negative for DVT in visualized veins.   Landry Mellow, RDMS, RVT  12/09/2014, 9:17 AM

## 2014-12-09 NOTE — Progress Notes (Signed)
[name] contacted to arrange EMS transport home for patient.  He stated that he has his key and is ready to return home. Denies having any family or friends to transport him and that he has to utilize EMS transport for MD appointments etc due to his health.  EMS forms completed and transport arranged.  Patient's nurse notified of above.  Lorie Phenix. Pauline Good, [place]

## 2014-12-09 NOTE — Progress Notes (Signed)
    Subjective:  No complaints overnight. He still has pain in his Rt leg. He says he feels like the DVT "has moved up".   Objective:  Vital Signs in the last 24 hours: Temp:  [97.8 F (36.6 C)-98.9 F (37.2 C)] 97.8 F (36.6 C) (03/31 0431) Pulse Rate:  [61-76] 64 (03/30 2024) Resp:  [16-18] 16 (03/31 0431) BP: (122-150)/(54-67) 134/67 mmHg (03/31 0431) SpO2:  [95 %-100 %] 100 % (03/31 0431) Weight:  [352 lb 8 oz (159.893 kg)] 352 lb 8 oz (159.893 kg) (03/31 0431)  Intake/Output from previous day:  Intake/Output Summary (Last 24 hours) at 12/09/14 0755 Last data filed at 12/09/14 0503  Gross per 24 hour  Intake    960 ml  Output   4050 ml  Net  -3090 ml    Physical Exam: General appearance: alert and cooperative Lungs: clear to auscultation bilaterally Heart: regular rate and rhythm Extremities: chronic venous changes, tender Rt calf   Rate: 66  Rhythm: normal sinus rhythm  Lab Results:  Recent Labs  12/07/14 1609 12/07/14 1653 12/09/14 0500  WBC 8.3  --  7.4  HGB 13.0 13.6 13.3  PLT 188  --  185    Recent Labs  12/07/14 1653  NA 139  K 3.5  CL 100  GLUCOSE 198*  BUN 11  CREATININE 0.70    Recent Labs  12/08/14 0321 12/08/14 0922  TROPONINI <0.03 <0.03   No results for input(s): INR in the last 72 hours.  Imaging: Imaging results have been reviewed  Cardiac Studies:  Assessment/Plan:  69 y.o. Morbidly obese, diabetic, disabled male with CAD- medical Rx, last Myoview May 2015, past history of DVTs, admitted with chest pain. R/O for MI, no plans for further ischemic evaluation. Venous dopplers ordered for possible DVT RLE- pending. Home if negative.   Active Problems:   Chest pain with moderate risk of acute coronary syndrome   DM (diabetes mellitus) with complications   CAD- known RCA disease- medical Rx   Obesity- BMI 49   Physical deconditioning   History of DVT (deep vein thrombosis)   Diabetic neuropathy   OSA (obstructive sleep  apnea)   Hypertension   Chronic indwelling Foley catheter   Chest pain   PLAN: On Lovenox, awaiting dopplers.   Kerin Ransom PA-C Beeper [id number] 12/09/2014, 7:55 AM  History and all data above reviewed.  Patient examined.  I agree with the findings as above.  No further chest pain.  The patient exam reveals COR:RRR  ,  Lungs: Clear  ,  Abd: Positive bowel sounds, no rebound no guarding, Ext Mild edema  .  All available labs, radiology testing, previous records reviewed. Agree with documented assessment and plan. Plan rule out DVT.  Home if negative.    Jeneen Rinks [facility]  8:44 AM  12/09/2014

## 2014-12-09 NOTE — Progress Notes (Signed)
Patient is active with [facility] for [facility]; Edwina RN with [facility] made aware of discharge home today and to resume home health care services with the patient; Aneta Mins [phone]

## 2014-12-10 DIAGNOSIS — L89312 Pressure ulcer of right buttock, stage 2: Secondary | ICD-10-CM | POA: Diagnosis not present

## 2014-12-10 DIAGNOSIS — L89612 Pressure ulcer of right heel, stage 2: Secondary | ICD-10-CM | POA: Diagnosis not present

## 2014-12-11 ENCOUNTER — Encounter (HOSPITAL_COMMUNITY): Payer: Self-pay | Admitting: Oncology

## 2014-12-11 ENCOUNTER — Emergency Department (HOSPITAL_COMMUNITY)
Admission: EM | Admit: 2014-12-11 | Discharge: 2014-12-11 | Disposition: A | Discharge status: Home / Self Care | Payer: Medicare Other | Attending: Emergency Medicine | Admitting: Emergency Medicine

## 2014-12-11 DIAGNOSIS — Z79899 Other long term (current) drug therapy: Secondary | ICD-10-CM | POA: Insufficient documentation

## 2014-12-11 DIAGNOSIS — Z87448 Personal history of other diseases of urinary system: Secondary | ICD-10-CM | POA: Diagnosis not present

## 2014-12-11 DIAGNOSIS — E1140 Type 2 diabetes mellitus with diabetic neuropathy, unspecified: Secondary | ICD-10-CM | POA: Insufficient documentation

## 2014-12-11 DIAGNOSIS — T839XXA Unspecified complication of genitourinary prosthetic device, implant and graft, initial encounter: Secondary | ICD-10-CM

## 2014-12-11 DIAGNOSIS — E669 Obesity, unspecified: Secondary | ICD-10-CM | POA: Diagnosis not present

## 2014-12-11 DIAGNOSIS — Z8669 Personal history of other diseases of the nervous system and sense organs: Secondary | ICD-10-CM | POA: Diagnosis not present

## 2014-12-11 DIAGNOSIS — G8929 Other chronic pain: Secondary | ICD-10-CM | POA: Insufficient documentation

## 2014-12-11 DIAGNOSIS — T83098A Other mechanical complication of other indwelling urethral catheter, initial encounter: Secondary | ICD-10-CM | POA: Diagnosis not present

## 2014-12-11 DIAGNOSIS — Z8701 Personal history of pneumonia (recurrent): Secondary | ICD-10-CM | POA: Insufficient documentation

## 2014-12-11 DIAGNOSIS — Z7982 Long term (current) use of aspirin: Secondary | ICD-10-CM | POA: Diagnosis not present

## 2014-12-11 DIAGNOSIS — K219 Gastro-esophageal reflux disease without esophagitis: Secondary | ICD-10-CM | POA: Diagnosis not present

## 2014-12-11 DIAGNOSIS — I509 Heart failure, unspecified: Secondary | ICD-10-CM | POA: Insufficient documentation

## 2014-12-11 DIAGNOSIS — I10 Essential (primary) hypertension: Secondary | ICD-10-CM | POA: Insufficient documentation

## 2014-12-11 DIAGNOSIS — Y846 Urinary catheterization as the cause of abnormal reaction of the patient, or of later complication, without mention of misadventure at the time of the procedure: Secondary | ICD-10-CM | POA: Diagnosis not present

## 2014-12-11 DIAGNOSIS — M1990 Unspecified osteoarthritis, unspecified site: Secondary | ICD-10-CM | POA: Insufficient documentation

## 2014-12-11 DIAGNOSIS — I2510 Atherosclerotic heart disease of native coronary artery without angina pectoris: Secondary | ICD-10-CM | POA: Diagnosis not present

## 2014-12-11 DIAGNOSIS — T8351XA Infection and inflammatory reaction due to indwelling urinary catheter, initial encounter: Secondary | ICD-10-CM | POA: Diagnosis not present

## 2014-12-11 LAB — URINALYSIS, ROUTINE W REFLEX MICROSCOPIC
Glucose, UA: NEGATIVE mg/dL
KETONES UR: NEGATIVE mg/dL
Nitrite: NEGATIVE
Specific Gravity, Urine: 1.038 — ABNORMAL HIGH (ref 1.005–1.030)
UROBILINOGEN UA: 1 mg/dL (ref 0.0–1.0)
pH: 6 (ref 5.0–8.0)

## 2014-12-11 LAB — URINE MICROSCOPIC-ADD ON

## 2014-12-11 NOTE — ED Notes (Signed)
Patient states that he "needs a size 18 foley" Patient states that a urologist placed an 18 Fr foley and he believes that this is what should be placed during this ED visit Sheet noted to be damp, but no active leaking noted upon assessment of foley insertion site Will make Dr. Venora Maples aware

## 2014-12-11 NOTE — ED Notes (Signed)
[facility] called and made aware of patient's need for transportation home

## 2014-12-11 NOTE — ED Notes (Signed)
[facility] is at bedside.

## 2014-12-11 NOTE — ED Notes (Signed)
Pt transported from home by EMS with c/o lower abd pressure, possibly clogged indwelling foley cath

## 2014-12-11 NOTE — ED Notes (Signed)
Patient again informed of rationale for application of securement device for foley tubing Patient continues to refuse application of securement device

## 2014-12-11 NOTE — ED Notes (Signed)
Patient states that placement of 18 Fr foley "feels much better"  Patient denies discomfort No leaking noted around insertion site Patient in NAD

## 2014-12-11 NOTE — ED Notes (Signed)
Patient states that he needs [facility] to go home

## 2014-12-11 NOTE — ED Provider Notes (Signed)
CSN: [id number]     Arrival date & time 12/11/14  [id number] History   First MD Initiated Contact with Patient 12/11/14 0250     Chief Complaint  Patient presents with  . Clogged foley      HPI Patient presents to the ER with complaints of 4 catheter not draining and lower abdominal pressure.  He has a 16 [place] Foley in place and states that over the past 4 hours his been unable to drain.  He did not attempt to irrigate this at home.  Denies fever.  No nausea or vomiting.  No other complaints.  This is a chronic indwelling Foley catheter.  No other complaints.  Requests catheter exchange   Past Medical History  Diagnosis Date  . Obese   . Hypertension   . Diabetic neuropathy, painful   . Chronic indwelling Foley catheter   . Bladder spasms   . Hyperlipidemia   . GERD (gastroesophageal reflux disease)   . Neurogenic bladder   . CAD (coronary artery disease)   . PONV (postoperative nausea and vomiting)   . CHF (congestive heart failure)   . DVT (deep venous thrombosis) 1980's    LLE  . Type II diabetes mellitus   . Diabetic diarrhea   . Pneumonia 1999  . OSA (obstructive sleep apnea)     "they wanted me to wear a mask; I couldn't" (11/10/2014)  . Arthritis     "hands, ankles, knees" (11/10/2014)  . Chronic lower back pain   . Stroke 10/2011; 06/2014    right hand numbness/notes 10/20/2011, pt not sure he had stroke in 2013; "right hand weaker and right face not quite right since" (11/10/2014)   Past Surgical History  Procedure Laterality Date  . Gastroplasty    . Total knee arthroplasty Right 1990's  . Left heart catheterization with coronary angiogram N/A 10/24/2011    Procedure: LEFT HEART CATHETERIZATION WITH CORONARY ANGIOGRAM;  Surgeon: Candee Furbish, MD;  Location: [facility] CATH LAB;  Service: Cardiovascular;  Laterality: N/A;  right radial artery approach  . Cholecystectomy open  1970's?  . Joint replacement    . Carpal tunnel release Bilateral ~ 2003-2004   Family History  Problem  Relation Age of Onset  . Hypertension Mother   . Diabetes type I Father   . Cancer Brother      Testicular Cancer  . Cancer Brother     Leukemia   History  Substance Use Topics  . Smoking status: Former Smoker -- 2.00 packs/day for 20 years    Types: Cigarettes    Quit date: 09/10/1976  . Smokeless tobacco: Never Used  . 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     Allergies  Ace inhibitors; Lipitor; Metformin and related; Cefadroxil; Cephalexin; Morphine and related; and Robaxin  Home Medications   Prior to Admission medications   Medication Sig Start Date End Date Taking? Authorizing Provider  acetaminophen (TYLENOL) 325 MG tablet Take 2 tablets (650 mg total) by mouth every 4 (four) hours as needed for headache or mild pain. 12/08/14  Yes Erlene Quan, PA-C  aspirin EC 81 MG tablet Take 81 mg by mouth every morning.    Yes Historical Provider, MD  calcium carbonate (TUMS - DOSED IN MG ELEMENTAL CALCIUM) 500 MG chewable tablet Chew 1 tablet (200 mg of elemental calcium total) by mouth 3 (three) times daily as  needed for indigestion or heartburn. 11/19/14  Yes Debbe Odea, MD  dicyclomine (BENTYL) 10 MG capsule Take 1 capsule (10 mg total) by mouth 3 (three) times daily before meals. 11/19/14  Yes Debbe Odea, MD  diphenhydrAMINE (SOMINEX) 25 MG tablet Take 25 mg by mouth at bedtime as needed for sleep.   Yes Historical Provider, MD  diphenoxylate-atropine (LOMOTIL) 2.5-0.025 MG per tablet Take 1 tablet by mouth 3 (three) times daily as needed. For diarrhea 09/06/14  Yes Tiffany L Reed, DO  fexofenadine (ALLEGRA) 180 MG tablet Take 180 mg by mouth at bedtime as needed for allergies.    Yes Historical Provider, MD  furosemide (LASIX) 40 MG tablet Take 1 tablet (40 mg total) by mouth daily. 04/03/14  Yes Lavone Orn, MD  HUMULIN 70/30 (70-30) 100 UNIT/ML injection Inject  40-50 Units into the skin 2 (two) times daily.  10/15/14  Yes Historical Provider, MD  HYDROcodone-acetaminophen (NORCO/VICODIN) 5-325 MG per tablet Take 1 tablet by mouth 2 (two) times daily as needed for severe pain. 11/14/14  Yes Everlene Balls, MD  lacosamide 150 MG TABS Take 1 tablet (150 mg total) by mouth 2 (two) times daily. 11/19/14  Yes Debbe Odea, MD  lisinopril (PRINIVIL,ZESTRIL) 5 MG tablet Take 5 mg by mouth daily. 09/28/14  Yes Historical Provider, MD  metoprolol (LOPRESSOR) 50 MG tablet Take 1 tablet (50 mg total) by mouth 2 (two) times daily. 12/08/14  Yes Luke K Kilroy, PA-C  omeprazole (PRILOSEC) 20 MG capsule Take 20 mg by mouth daily.   Yes Historical Provider, MD  oxybutynin (DITROPAN) 5 MG tablet Take 5 mg by mouth 3 (three) times daily as needed for bladder spasms.   Yes Historical Provider, MD  nitroGLYCERIN (NITROSTAT) 0.4 MG SL tablet Place 1 tablet (0.4 mg total) under the tongue every 5 (five) minutes as needed for chest pain. 12/08/14   Luke K Kilroy, PA-C  Sennosides 25 MG TABS Take 25 mg by mouth daily as needed (for constipation).    Historical Provider, MD   BP 143/74 mmHg  Pulse 76  Temp(Src) 98.4 F (36.9 C) (Oral)  Resp 18  SpO2 99% Physical Exam  Constitutional: He is oriented to person, place, and time. He appears well-developed and well-nourished.  HENT:  Head: Normocephalic.  Eyes: EOM are normal.  Neck: Normal range of motion.  Pulmonary/Chest: Effort normal.  Abdominal: He exhibits no distension.  Mild suprapubic fullness  Genitourinary:  Foley catheter in place without abnormaiites of the penis.   Musculoskeletal: Normal range of motion.  Neurological: He is alert and oriented to person, place, and time.  Psychiatric: He has a normal mood and affect.  Nursing note and vitals reviewed.   ED Course  Procedures (including critical care time) Labs Review Labs Reviewed  URINALYSIS, ROUTINE W REFLEX MICROSCOPIC - Abnormal; Notable for the following:     Color, Urine AMBER (*)    APPearance TURBID (*)    Specific Gravity, Urine 1.038 (*)    Hgb urine dipstick LARGE (*)    Bilirubin Urine SMALL (*)    Protein, ur >300 (*)    Leukocytes, UA MODERATE (*)    All other components within normal limits  URINE MICROSCOPIC-ADD ON - Abnormal; Notable for the following:    Bacteria, UA MANY (*)    All other components within normal limits    Imaging Review No results found.   EKG Interpretation None      MDM   Final diagnoses:  Foley catheter problem,  initial encounter    Catheter exchange without difficulty.  Outpatient follow-up.  Discharge home. Pt states he is supposed to have an 18 french catheter in. Reports the 16 was placed by home health and should not have been placed. Pt is demands 18 french  Urine appears dirty from chronic indwelling catheter. Doubt infection    Jola Schmidt, MD 12/11/14 [phone]

## 2014-12-11 NOTE — ED Notes (Signed)
PATIENT REFUSING TO HAVE ANCHORING DEVICE OR ANOTHER OTHER METHOD OF SECUREMENT FOR FOLEY TUBING

## 2014-12-12 DIAGNOSIS — E1142 Type 2 diabetes mellitus with diabetic polyneuropathy: Secondary | ICD-10-CM | POA: Diagnosis not present

## 2014-12-12 DIAGNOSIS — I2510 Atherosclerotic heart disease of native coronary artery without angina pectoris: Secondary | ICD-10-CM | POA: Diagnosis not present

## 2014-12-12 DIAGNOSIS — I5032 Chronic diastolic (congestive) heart failure: Secondary | ICD-10-CM | POA: Diagnosis not present

## 2014-12-12 DIAGNOSIS — N319 Neuromuscular dysfunction of bladder, unspecified: Secondary | ICD-10-CM | POA: Diagnosis not present

## 2014-12-14 LAB — MICROALBUMIN, URINE: Microalb, Ur: 27.8

## 2014-12-15 DIAGNOSIS — I5032 Chronic diastolic (congestive) heart failure: Secondary | ICD-10-CM | POA: Diagnosis not present

## 2014-12-15 DIAGNOSIS — E1142 Type 2 diabetes mellitus with diabetic polyneuropathy: Secondary | ICD-10-CM | POA: Diagnosis not present

## 2014-12-17 DIAGNOSIS — I5032 Chronic diastolic (congestive) heart failure: Secondary | ICD-10-CM | POA: Diagnosis not present

## 2014-12-17 DIAGNOSIS — E1142 Type 2 diabetes mellitus with diabetic polyneuropathy: Secondary | ICD-10-CM | POA: Diagnosis not present

## 2014-12-20 DIAGNOSIS — I5032 Chronic diastolic (congestive) heart failure: Secondary | ICD-10-CM | POA: Diagnosis not present

## 2014-12-20 DIAGNOSIS — E1142 Type 2 diabetes mellitus with diabetic polyneuropathy: Secondary | ICD-10-CM | POA: Diagnosis not present

## 2014-12-22 ENCOUNTER — Emergency Department (HOSPITAL_COMMUNITY)
Admission: EM | Admit: 2014-12-22 | Discharge: 2014-12-23 | Disposition: A | Discharge status: Home / Self Care | Payer: Medicare Other | Attending: Emergency Medicine | Admitting: Emergency Medicine

## 2014-12-22 ENCOUNTER — Emergency Department (HOSPITAL_COMMUNITY): Payer: Medicare Other

## 2014-12-22 ENCOUNTER — Encounter (HOSPITAL_COMMUNITY): Payer: Self-pay

## 2014-12-22 DIAGNOSIS — Z87891 Personal history of nicotine dependence: Secondary | ICD-10-CM | POA: Diagnosis not present

## 2014-12-22 DIAGNOSIS — Z7982 Long term (current) use of aspirin: Secondary | ICD-10-CM | POA: Diagnosis not present

## 2014-12-22 DIAGNOSIS — Z8673 Personal history of transient ischemic attack (TIA), and cerebral infarction without residual deficits: Secondary | ICD-10-CM | POA: Insufficient documentation

## 2014-12-22 DIAGNOSIS — R55 Syncope and collapse: Secondary | ICD-10-CM | POA: Insufficient documentation

## 2014-12-22 DIAGNOSIS — I2510 Atherosclerotic heart disease of native coronary artery without angina pectoris: Secondary | ICD-10-CM | POA: Insufficient documentation

## 2014-12-22 DIAGNOSIS — Z87448 Personal history of other diseases of urinary system: Secondary | ICD-10-CM | POA: Diagnosis not present

## 2014-12-22 DIAGNOSIS — M1990 Unspecified osteoarthritis, unspecified site: Secondary | ICD-10-CM | POA: Diagnosis not present

## 2014-12-22 DIAGNOSIS — Z8701 Personal history of pneumonia (recurrent): Secondary | ICD-10-CM | POA: Diagnosis not present

## 2014-12-22 DIAGNOSIS — K219 Gastro-esophageal reflux disease without esophagitis: Secondary | ICD-10-CM | POA: Diagnosis not present

## 2014-12-22 DIAGNOSIS — E1142 Type 2 diabetes mellitus with diabetic polyneuropathy: Secondary | ICD-10-CM | POA: Diagnosis not present

## 2014-12-22 DIAGNOSIS — I10 Essential (primary) hypertension: Secondary | ICD-10-CM | POA: Insufficient documentation

## 2014-12-22 DIAGNOSIS — G8929 Other chronic pain: Secondary | ICD-10-CM | POA: Insufficient documentation

## 2014-12-22 DIAGNOSIS — Z794 Long term (current) use of insulin: Secondary | ICD-10-CM | POA: Diagnosis not present

## 2014-12-22 DIAGNOSIS — I509 Heart failure, unspecified: Secondary | ICD-10-CM | POA: Diagnosis not present

## 2014-12-22 DIAGNOSIS — Z86718 Personal history of other venous thrombosis and embolism: Secondary | ICD-10-CM | POA: Diagnosis not present

## 2014-12-22 DIAGNOSIS — R6883 Chills (without fever): Secondary | ICD-10-CM | POA: Diagnosis not present

## 2014-12-22 DIAGNOSIS — E1140 Type 2 diabetes mellitus with diabetic neuropathy, unspecified: Secondary | ICD-10-CM | POA: Diagnosis not present

## 2014-12-22 DIAGNOSIS — R079 Chest pain, unspecified: Secondary | ICD-10-CM | POA: Diagnosis not present

## 2014-12-22 DIAGNOSIS — I5032 Chronic diastolic (congestive) heart failure: Secondary | ICD-10-CM | POA: Diagnosis not present

## 2014-12-22 LAB — I-STAT CG4 LACTIC ACID, ED: Lactic Acid, Venous: 1.43 mmol/L (ref 0.5–2.0)

## 2014-12-22 LAB — I-STAT TROPONIN, ED: Troponin i, poc: 0.01 ng/mL (ref 0.00–0.08)

## 2014-12-22 LAB — CBG MONITORING, ED: Glucose-Capillary: 324 mg/dL — ABNORMAL HIGH (ref 70–99)

## 2014-12-22 NOTE — ED Provider Notes (Signed)
CSN: [id number]     Arrival date & time 12/22/14  2231 History  This chart was scribed for Brian Shanks, MD by Randa Evens, ED Scribe. This patient was seen in room A04C/A04C and the patient's care was started at 11:28 PM.     Chief Complaint  Patient presents with  . Fall  . Chest Pain  . Loss of Consciousness   Patient is a 69 y.o. male presenting with fall, chest pain, and syncope. The history is provided by the patient. No language interpreter was used.  Fall Associated symptoms include chest pain.  Chest Pain Associated symptoms: syncope   Loss of Consciousness Associated symptoms: chest pain    HPI Comments: [name] is a 69 y.o. male brought in by ambulance, who presents to the Emergency Department complaining of syncope onset tonight PTA. Pt states that he came from a siting to standing position using his walker to get the door, the next thing he knew he was leaning on the wall still upright. Pt states that he may have hit his head on the wall. He was then able to walk back to his chair and sit down. Pt states that he doesn't remember much of what happened, just waking up leaning on the wall. Pt states that he does ambulate with a walker at baseline. Pt reports some chest presssure that has resolved after taking some aspirin. Pt reports chills and diarrhea. Pt states that during this episode he pulled his urinary catheter out. Pt denies HA, SOB, nausea, diaphoresis, cough, abdominal pain, nausea or vomiting. Pt denies HX of syncopal episodes. Pt states that he is unsure of recent antibiotic use. Pt does report having diabetic neuropathy and diabetic ulcers on his feet.   Past Medical History  Diagnosis Date  . Obese   . Hypertension   . Diabetic neuropathy, painful   . Chronic indwelling Foley catheter   . Bladder spasms   . Hyperlipidemia   . GERD (gastroesophageal reflux disease)   . Neurogenic bladder   . CAD (coronary artery disease)   . PONV (postoperative  nausea and vomiting)   . CHF (congestive heart failure)   . DVT (deep venous thrombosis) 1980's    LLE  . Type II diabetes mellitus   . Diabetic diarrhea   . Pneumonia 1999  . OSA (obstructive sleep apnea)     "they wanted me to wear a mask; I couldn't" (11/10/2014)  . Arthritis     "hands, ankles, knees" (11/10/2014)  . Chronic lower back pain   . Stroke 10/2011; 06/2014    right hand numbness/notes 10/20/2011, pt not sure he had stroke in 2013; "right hand weaker and right face not quite right since" (11/10/2014)   Past Surgical History  Procedure Laterality Date  . Gastroplasty    . Total knee arthroplasty Right 1990's  . Left heart catheterization with coronary angiogram N/A 10/24/2011    Procedure: LEFT HEART CATHETERIZATION WITH CORONARY ANGIOGRAM;  Surgeon: Candee Furbish, MD;  Location: [facility] CATH LAB;  Service: Cardiovascular;  Laterality: N/A;  right radial artery approach  . Cholecystectomy open  1970's?  . Joint replacement    . Carpal tunnel release Bilateral ~ 2003-2004   Family History  Problem Relation Age of Onset  . Hypertension Mother   . Diabetes type I Father   . Cancer Brother      Testicular Cancer  . Cancer Brother     Leukemia   History  Substance Use Topics  . Smoking  status: Former Smoker -- 2.00 packs/day for 20 years    Types: Cigarettes    Quit date: 09/10/1976  . Smokeless tobacco: Never Used  . 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  A complete 10 system review of systems was obtained and all systems are negative except as noted in the HPI and PMH.     Allergies  Ace inhibitors; Lipitor; Metformin and related; Cefadroxil; Cephalexin; Morphine and related; and Robaxin  Home Medications   Prior to Admission medications   Medication Sig Start Date End Date Taking? Authorizing Provider  acetaminophen (TYLENOL) 325 MG tablet Take 2  tablets (650 mg total) by mouth every 4 (four) hours as needed for headache or mild pain. 12/08/14   Erlene Quan, PA-C  aspirin EC 81 MG tablet Take 81 mg by mouth every morning.     Historical Provider, MD  calcium carbonate (TUMS - DOSED IN MG ELEMENTAL CALCIUM) 500 MG chewable tablet Chew 1 tablet (200 mg of elemental calcium total) by mouth 3 (three) times daily as needed for indigestion or heartburn. 11/19/14   Debbe Odea, MD  dicyclomine (BENTYL) 10 MG capsule Take 1 capsule (10 mg total) by mouth 3 (three) times daily before meals. 11/19/14   Debbe Odea, MD  diphenhydrAMINE (SOMINEX) 25 MG tablet Take 25 mg by mouth at bedtime as needed for sleep.    Historical Provider, MD  diphenoxylate-atropine (LOMOTIL) 2.5-0.025 MG per tablet Take 1 tablet by mouth 3 (three) times daily as needed. For diarrhea 09/06/14   Tiffany L Reed, DO  fexofenadine (ALLEGRA) 180 MG tablet Take 180 mg by mouth at bedtime as needed for allergies.     Historical Provider, MD  furosemide (LASIX) 40 MG tablet Take 1 tablet (40 mg total) by mouth daily. 04/03/14   Lavone Orn, MD  HUMULIN 70/30 (70-30) 100 UNIT/ML injection Inject 40-50 Units into the skin 2 (two) times daily.  10/15/14   Historical Provider, MD  HYDROcodone-acetaminophen (NORCO/VICODIN) 5-325 MG per tablet Take 1 tablet by mouth 2 (two) times daily as needed for severe pain. 11/14/14   Everlene Balls, MD  lacosamide 150 MG TABS Take 1 tablet (150 mg total) by mouth 2 (two) times daily. 11/19/14   Debbe Odea, MD  lisinopril (PRINIVIL,ZESTRIL) 5 MG tablet Take 5 mg by mouth daily. 09/28/14   Historical Provider, MD  metoprolol (LOPRESSOR) 50 MG tablet Take 1 tablet (50 mg total) by mouth 2 (two) times daily. 12/08/14   Erlene Quan, PA-C  nitroGLYCERIN (NITROSTAT) 0.4 MG SL tablet Place 1 tablet (0.4 mg total) under the tongue every 5 (five) minutes as needed for chest pain. 12/08/14   Erlene Quan, PA-C  omeprazole (PRILOSEC) 20 MG capsule Take 20 mg by mouth  daily.    Historical Provider, MD  oxybutynin (DITROPAN) 5 MG tablet Take 5 mg by mouth 3 (three) times daily as needed for bladder spasms.    Historical Provider, MD  Sennosides 25 MG TABS Take 25 mg by mouth daily as needed (for constipation).    Historical Provider, MD   BP 146/68 mmHg  Pulse 90  Temp(Src) 98.8 F (37.1 C) (Oral)  Resp 17  Ht 6\' 1"  (1.854 m)  Wt 345 lb (156.491 kg)  BMI 45.53 kg/m2  SpO2 96%   Physical Exam  Constitutional: He is oriented to person, place, and time. No  distress.  Patient is morbidly obese. He is alert and nontoxic. He shows no signs of respiratory distress.  HENT:  Head: Normocephalic and atraumatic.  Right Ear: External ear normal.  Left Ear: External ear normal.  Nose: Nose normal.  Mouth/Throat: Oropharynx is clear and moist.  Eyes: Conjunctivae and EOM are normal.  Neck: Neck supple. No tracheal deviation present.  Cardiovascular: Normal rate, regular rhythm, normal heart sounds and intact distal pulses.   Pulmonary/Chest: Effort normal. No respiratory distress. He has no wheezes. He has no rales.  Abdominal: Soft. Bowel sounds are normal. He exhibits no distension. There is no tenderness.  Abdomen is morbidly obese. There is mild candidal rash in the intertriginous skin folds.  Genitourinary:  Penis has just a very small amount of bloody urine present at the scrotum. The meatus however has no tear and there is no blood in the urinary meatus.  Musculoskeletal: He exhibits edema.  Patient has approximately 2+ edema bilateral lower extremities. He has shallow and dry eschar ulcerations on the lower extremities. There is scaling and mild erythema consistent with chronic venous stasis.  Neurological: He is alert and oriented to person, place, and time. No cranial nerve deficit.  The patient is physically very deconditioned however he has no focal neurologic deficit. He has use of both upper extremities for grips and push pull. He has symmetric  use of the lower extremities although he has limitations due to body habitus. Testing his orthostatics the patient is able to maneuver into a standing position using a chair to help support him  Skin: Skin is warm and dry. No erythema.  Psychiatric: He has a normal mood and affect. His behavior is normal.  Nursing note and vitals reviewed.  I have had the staff assist in repeating his orthostatic vital signs as I am in observation. When the patient was positioned into a seated position he closed his eyes approximately half way and stopped answering questions. He did not have a loss of body tone and his blood pressure and heart rate remained consistent. There was no dysrhythmia at the time of this episode. Subsequently he was assisted into a standing position and while standing had his eyes open however stared vaguely forward and did not answer questions for a few seconds. After this had concluded the patient asked if his "loss of consciousness episodes had occurred while we were doing the test. He reports he was unaware of these spells. Of note he did not lose postural tone and there was no evidence of dysrhythmia as they were occurring.  ED Course  Procedures (including critical care time) DIAGNOSTIC STUDIES: Oxygen Saturation is 98% on RA, normal by my interpretation.    COORDINATION OF CARE: 11:39 PM-Discussed treatment plan with pt at bedside and pt agreed to plan.     Labs Review Labs Reviewed  CBC WITH DIFFERENTIAL/PLATELET - Abnormal; Notable for the following:    WBC 10.9 (*)    Neutrophils Relative % 81 (*)    Neutro Abs 9.0 (*)    Lymphocytes Relative 8 (*)    All other components within normal limits  COMPREHENSIVE METABOLIC PANEL - Abnormal; Notable for the following:    Sodium 133 (*)    Glucose, Bld 340 (*)    Albumin 3.1 (*)    GFR calc non Af Amer 87 (*)    All other components within normal limits  URINALYSIS, ROUTINE W REFLEX MICROSCOPIC - Abnormal; Notable for the  following:    Color, Urine  AMBER (*)    APPearance CLOUDY (*)    Specific Gravity, Urine 1.031 (*)    Glucose, UA >1000 (*)    Hgb urine dipstick LARGE (*)    Ketones, ur 15 (*)    Protein, ur 100 (*)    Urobilinogen, UA 2.0 (*)    Leukocytes, UA SMALL (*)    All other components within normal limits  BRAIN NATRIURETIC PEPTIDE - Abnormal; Notable for the following:    B Natriuretic Peptide 120.1 (*)    All other components within normal limits  CBG MONITORING, ED - Abnormal; Notable for the following:    Glucose-Capillary 324 (*)    All other components within normal limits  URINE CULTURE  CULTURE, BLOOD (ROUTINE X 2)  CULTURE, BLOOD (ROUTINE X 2)  LIPASE, BLOOD  PROTIME-INR  URINE MICROSCOPIC-ADD ON  POCT CBG (FASTING - GLUCOSE)-MANUAL ENTRY  I-STAT TROPOININ, ED  I-STAT CG4 LACTIC ACID, ED  TYPE AND SCREEN    Imaging Review Dg Chest 2 View  12/23/2014   CLINICAL DATA:  Initial evaluation for acute syncope, chest pain.  EXAM: CHEST  2 VIEW  COMPARISON:  Prior radiograph from 12/07/2014  FINDINGS: The cardiac and mediastinal silhouettes are stable in size and contour, and remain within normal limits.  The lungs are normally inflated. No airspace consolidation, pleural effusion, or pulmonary edema is identified. There is no pneumothorax.  Multilevel degenerative endplate spurring seen throughout the visualized spine. No acute osseous abnormality. Surgical clips overlie the left upper quadrant.  IMPRESSION: No active cardiopulmonary disease.   Electronically Signed   By: Jeannine Boga M.D.   On: 12/23/2014 00:29     EKG Interpretation   Date/Time:  Wednesday December 22 2014 22:37:41 EDT Ventricular Rate:  100 PR Interval:    QRS Duration: 121 QT Interval:  354 QTC Calculation: 457 R Axis:   -55 Text Interpretation:  Age not entered, assumed to be  69 years old for  purpose of ECG interpretation Left bundle branch block agree no sig  change. Confirmed by Johnney Killian, MD,  Jeannie Done [phone]) on 12/23/2014 12:08:20 AM      MDM   Final diagnoses:  Near syncope   After reviewing the electronic medical record, the patient has been admitted and evaluated for syncope/near syncope episodes. He had an MRI MRA within the past month. At this time I was able to directly observe these episodes and there is no dysrhythmia present in association. His blood pressure actually elevates as he sits and stands rather than drops. Consideration is for a possible absence type of seizure or possibly psychogenic event. At this point I do feel the patient is safe for continued outpatient management with his primary care provider. At that time they can determine if the EEG is appropriate for the patient. Due to the patient's body habitus and general poor mobility he is chronically a fall risk. There was no evidence during this episode that he was losing postural tone or balance.     Brian Shanks, MD 12/23/14 [phone]

## 2014-12-22 NOTE — ED Notes (Signed)
Per EMS pt is poor historian; pt stats he thought someone was at the door and got up to answer but got dizzy; pt unsure if he passed out and/or for how long; pt states he leaned on wall; denies hitting head; Pt leaves at home alone and uses walker for ambulation. Pt has hx of LOC episodes; Pt pulled foley catheter out when he fell; Pt c/o of left shoulder pain to palpation in route; Pt had one episode of LOC for 3 secs and second episode of 5 secs with EMS; pt a&o immediately afterwards;  Pt refused IV access prior to arrival; pt had 324 ASA and 1 nitro in route;

## 2014-12-23 DIAGNOSIS — R55 Syncope and collapse: Secondary | ICD-10-CM | POA: Diagnosis not present

## 2014-12-23 DIAGNOSIS — R259 Unspecified abnormal involuntary movements: Secondary | ICD-10-CM | POA: Diagnosis not present

## 2014-12-23 LAB — URINALYSIS, ROUTINE W REFLEX MICROSCOPIC
Bilirubin Urine: NEGATIVE
Glucose, UA: >1000 mg/dL — AB
Ketones, ur: 15 mg/dL — AB
NITRITE: NEGATIVE
PH: 6.5 (ref 5.0–8.0)
Protein, ur: 100 mg/dL — AB
SPECIFIC GRAVITY, URINE: 1.031 — AB (ref 1.005–1.030)
Urobilinogen, UA: 2 mg/dL — ABNORMAL HIGH (ref 0.0–1.0)

## 2014-12-23 LAB — TYPE AND SCREEN
ABO/RH(D): A POS
Antibody Screen: NEGATIVE

## 2014-12-23 LAB — CBC WITH DIFFERENTIAL/PLATELET
Basophils Absolute: 0.1 10*3/uL (ref 0.0–0.1)
Basophils Relative: 1 % (ref 0–1)
EOS PCT: 2 % (ref 0–5)
Eosinophils Absolute: 0.2 10*3/uL (ref 0.0–0.7)
HCT: 41.3 % (ref 39.0–52.0)
HEMOGLOBIN: 13.3 g/dL (ref 13.0–17.0)
LYMPHS ABS: 0.8 10*3/uL (ref 0.7–4.0)
LYMPHS PCT: 8 % — AB (ref 12–46)
MCH: 29.2 pg (ref 26.0–34.0)
MCHC: 32.2 g/dL (ref 30.0–36.0)
MCV: 90.8 fL (ref 78.0–100.0)
Monocytes Absolute: 0.8 10*3/uL (ref 0.1–1.0)
Monocytes Relative: 8 % (ref 3–12)
NEUTROS PCT: 81 % — AB (ref 43–77)
Neutro Abs: 9 10*3/uL — ABNORMAL HIGH (ref 1.7–7.7)
Platelets: 173 10*3/uL (ref 150–400)
RBC: 4.55 MIL/uL (ref 4.22–5.81)
RDW: 13.5 % (ref 11.5–15.5)
WBC: 10.9 10*3/uL — ABNORMAL HIGH (ref 4.0–10.5)

## 2014-12-23 LAB — COMPREHENSIVE METABOLIC PANEL
ALT: 12 U/L (ref 0–53)
AST: 13 U/L (ref 0–37)
Albumin: 3.1 g/dL — ABNORMAL LOW (ref 3.5–5.2)
Alkaline Phosphatase: 104 U/L (ref 39–117)
Anion gap: 8 (ref 5–15)
BUN: 11 mg/dL (ref 6–23)
CALCIUM: 8.5 mg/dL (ref 8.4–10.5)
CO2: 29 mmol/L (ref 19–32)
CREATININE: 0.89 mg/dL (ref 0.50–1.35)
Chloride: 96 mmol/L (ref 96–112)
GFR calc Af Amer: >90 mL/min (ref >90)
GFR, EST NON AFRICAN AMERICAN: 87 mL/min — AB (ref >90)
Glucose, Bld: 340 mg/dL — ABNORMAL HIGH (ref 70–99)
Potassium: 5 mmol/L (ref 3.5–5.1)
Sodium: 133 mmol/L — ABNORMAL LOW (ref 135–145)
TOTAL PROTEIN: 6 g/dL (ref 6.0–8.3)
Total Bilirubin: 1 mg/dL (ref 0.3–1.2)

## 2014-12-23 LAB — URINE MICROSCOPIC-ADD ON

## 2014-12-23 LAB — PROTIME-INR
INR: 1.11 (ref 0.00–1.49)
Prothrombin Time: 14.4 seconds (ref 11.6–15.2)

## 2014-12-23 LAB — BRAIN NATRIURETIC PEPTIDE: B Natriuretic Peptide: 120.1 pg/mL — ABNORMAL HIGH (ref 0.0–100.0)

## 2014-12-23 LAB — ABO/RH: ABO/RH(D): A POS

## 2014-12-23 LAB — LIPASE, BLOOD: LIPASE: 19 U/L (ref 11–59)

## 2014-12-23 NOTE — Discharge Instructions (Signed)
Near-Syncope Near-syncope (commonly known as near fainting) is sudden weakness, dizziness, or feeling like you might pass out. During an episode of near-syncope, you may also develop pale skin, have tunnel vision, or feel sick to your stomach (nauseous). Near-syncope may occur when getting up after sitting or while standing for a long time. It is caused by a sudden decrease in blood flow to the brain. This decrease can result from various causes or triggers, most of which are not serious. However, because near-syncope can sometimes be a sign of something serious, a medical evaluation is required. The specific cause is often not determined. HOME CARE INSTRUCTIONS  Monitor your condition for any changes. The following actions may help to alleviate any discomfort you are experiencing:  Have someone stay with you until you feel stable.  Lie down right away and prop your feet up if you start feeling like you might faint. Breathe deeply and steadily. Wait until all the symptoms have passed. Most of these episodes last only a few minutes. You may feel tired for several hours.   Drink enough fluids to keep your urine clear or pale yellow.   If you are taking blood pressure or heart medicine, get up slowly when seated or lying down. Take several minutes to sit and then stand. This can reduce dizziness.  Follow up with your health care provider as directed. SEEK IMMEDIATE MEDICAL CARE IF:   You have a severe headache.   You have unusual pain in the chest, abdomen, or back.   You are bleeding from the mouth or rectum, or you have black or tarry stool.   You have an irregular or very fast heartbeat.   You have repeated fainting or have seizure-like jerking during an episode.   You faint when sitting or lying down.   You have confusion.   You have difficulty walking.   You have severe weakness.   You have vision problems.  MAKE SURE YOU:   Understand these instructions.  Will  watch your condition.  Will get help right away if you are not doing well or get worse. Document Released: 08/27/2005 Document Revised: 09/01/2013 Document Reviewed: 01/30/2013 Exit[employer] Patient Information 2015 [employer]. This information is not intended to replace advice given to you by your health care provider. Make sure you discuss any questions you have with your health care provider.  

## 2014-12-23 NOTE — ED Notes (Signed)
PTAR CALLED @0156 .

## 2014-12-23 NOTE — ED Notes (Signed)
EMT reports that pt had about 3 sec blackout while standing for othrostastics; Pt A&O immediately after; pt has not recollection of blackout; Md notified by RN

## 2014-12-26 LAB — URINE CULTURE: Colony Count: >=100000

## 2014-12-27 ENCOUNTER — Telehealth (HOSPITAL_COMMUNITY): Payer: Self-pay

## 2014-12-27 DIAGNOSIS — I5032 Chronic diastolic (congestive) heart failure: Secondary | ICD-10-CM | POA: Diagnosis not present

## 2014-12-27 DIAGNOSIS — E1122 Type 2 diabetes mellitus with diabetic chronic kidney disease: Secondary | ICD-10-CM | POA: Diagnosis not present

## 2014-12-27 DIAGNOSIS — N182 Chronic kidney disease, stage 2 (mild): Secondary | ICD-10-CM | POA: Diagnosis not present

## 2014-12-27 DIAGNOSIS — E1149 Type 2 diabetes mellitus with other diabetic neurological complication: Secondary | ICD-10-CM | POA: Diagnosis not present

## 2014-12-27 DIAGNOSIS — E1142 Type 2 diabetes mellitus with diabetic polyneuropathy: Secondary | ICD-10-CM | POA: Diagnosis not present

## 2014-12-27 LAB — CULTURE, BLOOD (ROUTINE X 2)

## 2014-12-27 NOTE — Progress Notes (Signed)
ED Antimicrobial Stewardship Positive Culture Follow Up   BENSYN VANDEGRIFT is an 69 y.o. male who presented to [facility] on 12/22/2014 with a chief complaint of  Chief Complaint  Patient presents with  . Fall  . Chest Pain  . Loss of Consciousness    Recent Results (from the past 720 hour(s))  MRSA PCR Screening     Status: Abnormal   Collection Time: 12/07/14 11:24 PM  Result Value Ref Range Status   MRSA by PCR POSITIVE (A) NEGATIVE Final    Comment:        The GeneXpert MRSA Assay (FDA approved for NASAL specimens only), is one component of a comprehensive MRSA colonization surveillance program. It is not intended to diagnose MRSA infection nor to guide or monitor treatment for MRSA infections. RESULT CALLED TO, READ BACK BY AND VERIFIED WITH: BARNHILL,S RN [id number] AT 0206 SKEEN,P   Urine culture     Status: None   Collection Time: 12/23/14 12:28 AM  Result Value Ref Range Status   Specimen Description URINE, CATHETERIZED  Final   Special Requests NONE  Final   Colony Count   Final    >=100,000 COLONIES/ML Performed at [facility]    Culture   Final    CITROBACTER FREUNDII METHICILLIN RESISTANT STAPHYLOCOCCUS AUREUS Note: RIFAMPIN AND GENTAMICIN SHOULD NOT BE USED AS SINGLE DRUGS FOR TREATMENT OF STAPH INFECTIONS. Performed at [facility]    Report Status 12/26/2014 FINAL  Final   Organism ID, Bacteria CITROBACTER FREUNDII  Final   Organism ID, Bacteria METHICILLIN RESISTANT STAPHYLOCOCCUS AUREUS  Final      Susceptibility   Citrobacter freundii - MIC*    CEFAZOLIN >=64 RESISTANT Resistant     CEFTRIAXONE <=1 SENSITIVE Sensitive     CIPROFLOXACIN <=0.25 SENSITIVE Sensitive     GENTAMICIN <=1 SENSITIVE Sensitive     LEVOFLOXACIN <=0.12 SENSITIVE Sensitive     NITROFURANTOIN <=16 SENSITIVE Sensitive     TOBRAMYCIN <=1 SENSITIVE Sensitive     TRIMETH/SULFA <=20 SENSITIVE Sensitive     PIP/TAZO <=4 SENSITIVE Sensitive     * CITROBACTER  FREUNDII   Methicillin resistant staphylococcus aureus - MIC*    GENTAMICIN <=0.5 SENSITIVE Sensitive     LEVOFLOXACIN >=8 RESISTANT Resistant     NITROFURANTOIN <=16 SENSITIVE Sensitive     OXACILLIN >=4 RESISTANT Resistant     PENICILLIN >=0.5 RESISTANT Resistant     RIFAMPIN <=0.5 SENSITIVE Sensitive     TRIMETH/SULFA >=320 RESISTANT Resistant     VANCOMYCIN 1 SENSITIVE Sensitive     TETRACYCLINE 2 SENSITIVE Sensitive     * METHICILLIN RESISTANT STAPHYLOCOCCUS AUREUS  Culture, blood (routine x 2)     Status: None (Preliminary result)   Collection Time: 12/23/14 12:50 AM  Result Value Ref Range Status   Specimen Description BLOOD RIGHT ARM  Final   Special Requests BOTTLES DRAWN AEROBIC AND ANAEROBIC 5CC  Final   Culture   Final           BLOOD CULTURE RECEIVED NO GROWTH TO DATE CULTURE WILL BE HELD FOR 5 DAYS BEFORE ISSUING A FINAL NEGATIVE REPORT Performed at [facility]    Report Status PENDING  Incomplete  Culture, blood (routine x 2)     Status: None (Preliminary result)   Collection Time: 12/23/14 12:55 AM  Result Value Ref Range Status   Specimen Description BLOOD RIGHT HAND  Final   Special Requests BOTTLES DRAWN AEROBIC ONLY 5CC  Final   Culture  Final    DIPHTHEROIDS(CORYNEBACTERIUM SPECIES) Note: Standardized susceptibility testing for this organism is not available. Performed at [place] PENDING  Incomplete    Patient discharged originally without antimicrobial agent. Cultures came back and treatment is still not indicated.  69 y/o M with fall and loss of consciousness.  Pt reported to of pulled out his urinary catheter during his fall.  Pt was afebrile, and WBC of 10.9, UA nitrite neg, small leukocytes. Cultures grew Citrobacter/MRSA, however, treatment is not indicated due to pt being asymptomatic/due to presence of urinary catheter  New antibiotic prescription: No treatment  ED Provider: Adelfa Koh,  PharmD Candidate

## 2014-12-27 NOTE — ED Notes (Signed)
Post ED Visit - Positive Culture Follow-up  Culture report reviewed by antimicrobial stewardship pharmacist: []  Wes [place], Pharm.D., BCPS []  Heide Guile, Pharm.D., BCPS [x]  Alycia Rossetti, Pharm.D., BCPS []  [place], [place].D., BCPS, AAHIVP []  Legrand Como, Pharm.D., BCPS, AAHIVP []  Isac Sarna, Pharm.D., BCPS  Positive urine culture Reviewed by T. Greene  no further patient follow-up is required at this time.  Ileene Musa 12/27/2014, 9:20 AM

## 2014-12-29 LAB — CULTURE, BLOOD (ROUTINE X 2): Culture: NO GROWTH

## 2014-12-30 DIAGNOSIS — I5032 Chronic diastolic (congestive) heart failure: Secondary | ICD-10-CM | POA: Diagnosis not present

## 2014-12-30 DIAGNOSIS — E1142 Type 2 diabetes mellitus with diabetic polyneuropathy: Secondary | ICD-10-CM | POA: Diagnosis not present

## 2015-01-04 DIAGNOSIS — I5032 Chronic diastolic (congestive) heart failure: Secondary | ICD-10-CM | POA: Diagnosis not present

## 2015-01-04 DIAGNOSIS — E1142 Type 2 diabetes mellitus with diabetic polyneuropathy: Secondary | ICD-10-CM | POA: Diagnosis not present

## 2015-01-05 DIAGNOSIS — E1142 Type 2 diabetes mellitus with diabetic polyneuropathy: Secondary | ICD-10-CM | POA: Diagnosis not present

## 2015-01-05 DIAGNOSIS — I5032 Chronic diastolic (congestive) heart failure: Secondary | ICD-10-CM | POA: Diagnosis not present

## 2015-01-11 DIAGNOSIS — I5032 Chronic diastolic (congestive) heart failure: Secondary | ICD-10-CM | POA: Diagnosis not present

## 2015-01-11 DIAGNOSIS — E1142 Type 2 diabetes mellitus with diabetic polyneuropathy: Secondary | ICD-10-CM | POA: Diagnosis not present

## 2015-01-13 DIAGNOSIS — E1142 Type 2 diabetes mellitus with diabetic polyneuropathy: Secondary | ICD-10-CM | POA: Diagnosis not present

## 2015-01-13 DIAGNOSIS — I5032 Chronic diastolic (congestive) heart failure: Secondary | ICD-10-CM | POA: Diagnosis not present

## 2015-01-18 DIAGNOSIS — E1142 Type 2 diabetes mellitus with diabetic polyneuropathy: Secondary | ICD-10-CM | POA: Diagnosis not present

## 2015-01-18 DIAGNOSIS — I5032 Chronic diastolic (congestive) heart failure: Secondary | ICD-10-CM | POA: Diagnosis not present

## 2015-01-19 DIAGNOSIS — R339 Retention of urine, unspecified: Secondary | ICD-10-CM | POA: Diagnosis not present

## 2015-01-19 DIAGNOSIS — N401 Enlarged prostate with lower urinary tract symptoms: Secondary | ICD-10-CM | POA: Diagnosis not present

## 2015-01-19 DIAGNOSIS — E1142 Type 2 diabetes mellitus with diabetic polyneuropathy: Secondary | ICD-10-CM | POA: Diagnosis not present

## 2015-01-19 DIAGNOSIS — I5032 Chronic diastolic (congestive) heart failure: Secondary | ICD-10-CM | POA: Diagnosis not present

## 2015-01-20 DIAGNOSIS — E1142 Type 2 diabetes mellitus with diabetic polyneuropathy: Secondary | ICD-10-CM | POA: Diagnosis not present

## 2015-01-20 DIAGNOSIS — I5032 Chronic diastolic (congestive) heart failure: Secondary | ICD-10-CM | POA: Diagnosis not present

## 2015-01-22 ENCOUNTER — Other Ambulatory Visit: Payer: Self-pay | Admitting: Cardiology

## 2015-01-25 DIAGNOSIS — E1142 Type 2 diabetes mellitus with diabetic polyneuropathy: Secondary | ICD-10-CM | POA: Diagnosis not present

## 2015-01-25 DIAGNOSIS — I5032 Chronic diastolic (congestive) heart failure: Secondary | ICD-10-CM | POA: Diagnosis not present

## 2015-01-27 DIAGNOSIS — I5032 Chronic diastolic (congestive) heart failure: Secondary | ICD-10-CM | POA: Diagnosis not present

## 2015-01-27 DIAGNOSIS — E1142 Type 2 diabetes mellitus with diabetic polyneuropathy: Secondary | ICD-10-CM | POA: Diagnosis not present

## 2015-02-10 ENCOUNTER — Other Ambulatory Visit: Payer: Self-pay | Admitting: Internal Medicine

## 2015-02-21 DIAGNOSIS — E1122 Type 2 diabetes mellitus with diabetic chronic kidney disease: Secondary | ICD-10-CM | POA: Diagnosis not present

## 2015-02-21 DIAGNOSIS — E1165 Type 2 diabetes mellitus with hyperglycemia: Secondary | ICD-10-CM | POA: Diagnosis not present

## 2015-02-21 DIAGNOSIS — E162 Hypoglycemia, unspecified: Secondary | ICD-10-CM | POA: Diagnosis not present

## 2015-02-21 DIAGNOSIS — I10 Essential (primary) hypertension: Secondary | ICD-10-CM | POA: Diagnosis not present

## 2015-02-21 DIAGNOSIS — E1142 Type 2 diabetes mellitus with diabetic polyneuropathy: Secondary | ICD-10-CM | POA: Diagnosis not present

## 2015-02-21 DIAGNOSIS — E1149 Type 2 diabetes mellitus with other diabetic neurological complication: Secondary | ICD-10-CM | POA: Diagnosis not present

## 2015-02-21 DIAGNOSIS — N182 Chronic kidney disease, stage 2 (mild): Secondary | ICD-10-CM | POA: Diagnosis not present

## 2015-02-21 DIAGNOSIS — Z794 Long term (current) use of insulin: Secondary | ICD-10-CM | POA: Diagnosis not present

## 2015-02-23 DIAGNOSIS — R339 Retention of urine, unspecified: Secondary | ICD-10-CM | POA: Diagnosis not present

## 2015-03-01 DIAGNOSIS — L97829 Non-pressure chronic ulcer of other part of left lower leg with unspecified severity: Secondary | ICD-10-CM | POA: Diagnosis not present

## 2015-03-01 DIAGNOSIS — R296 Repeated falls: Secondary | ICD-10-CM | POA: Diagnosis not present

## 2015-03-01 DIAGNOSIS — E1165 Type 2 diabetes mellitus with hyperglycemia: Secondary | ICD-10-CM | POA: Diagnosis not present

## 2015-03-01 DIAGNOSIS — L97819 Non-pressure chronic ulcer of other part of right lower leg with unspecified severity: Secondary | ICD-10-CM | POA: Diagnosis not present

## 2015-03-25 DIAGNOSIS — I83029 Varicose veins of left lower extremity with ulcer of unspecified site: Secondary | ICD-10-CM | POA: Diagnosis not present

## 2015-03-25 DIAGNOSIS — I872 Venous insufficiency (chronic) (peripheral): Secondary | ICD-10-CM | POA: Diagnosis not present

## 2015-04-14 ENCOUNTER — Inpatient Hospital Stay (HOSPITAL_COMMUNITY)
Admission: EM | Admit: 2015-04-14 | Discharge: 2015-04-18 | DRG: 698 | Disposition: A | Discharge status: Skilled Nursing Facility | Payer: Medicare Other | Attending: Internal Medicine | Admitting: Internal Medicine

## 2015-04-14 ENCOUNTER — Encounter (HOSPITAL_COMMUNITY): Payer: Self-pay | Admitting: Emergency Medicine

## 2015-04-14 ENCOUNTER — Emergency Department (HOSPITAL_COMMUNITY): Payer: Medicare Other

## 2015-04-14 DIAGNOSIS — Z6841 Body Mass Index (BMI) 40.0 and over, adult: Secondary | ICD-10-CM

## 2015-04-14 DIAGNOSIS — T83511A Infection and inflammatory reaction due to indwelling urethral catheter, initial encounter: Secondary | ICD-10-CM

## 2015-04-14 DIAGNOSIS — R55 Syncope and collapse: Secondary | ICD-10-CM | POA: Diagnosis present

## 2015-04-14 DIAGNOSIS — T8351XA Infection and inflammatory reaction due to indwelling urinary catheter, initial encounter: Secondary | ICD-10-CM | POA: Diagnosis present

## 2015-04-14 DIAGNOSIS — K219 Gastro-esophageal reflux disease without esophagitis: Secondary | ICD-10-CM | POA: Diagnosis present

## 2015-04-14 DIAGNOSIS — M6281 Muscle weakness (generalized): Secondary | ICD-10-CM | POA: Diagnosis not present

## 2015-04-14 DIAGNOSIS — G9340 Encephalopathy, unspecified: Secondary | ICD-10-CM | POA: Diagnosis present

## 2015-04-14 DIAGNOSIS — R4182 Altered mental status, unspecified: Secondary | ICD-10-CM

## 2015-04-14 DIAGNOSIS — Z7982 Long term (current) use of aspirin: Secondary | ICD-10-CM

## 2015-04-14 DIAGNOSIS — E1140 Type 2 diabetes mellitus with diabetic neuropathy, unspecified: Secondary | ICD-10-CM | POA: Diagnosis not present

## 2015-04-14 DIAGNOSIS — Z86718 Personal history of other venous thrombosis and embolism: Secondary | ICD-10-CM | POA: Diagnosis not present

## 2015-04-14 DIAGNOSIS — Y846 Urinary catheterization as the cause of abnormal reaction of the patient, or of later complication, without mention of misadventure at the time of the procedure: Secondary | ICD-10-CM | POA: Diagnosis present

## 2015-04-14 DIAGNOSIS — M25512 Pain in left shoulder: Secondary | ICD-10-CM | POA: Diagnosis present

## 2015-04-14 DIAGNOSIS — I472 Ventricular tachycardia: Secondary | ICD-10-CM | POA: Diagnosis not present

## 2015-04-14 DIAGNOSIS — Z8249 Family history of ischemic heart disease and other diseases of the circulatory system: Secondary | ICD-10-CM

## 2015-04-14 DIAGNOSIS — Z8673 Personal history of transient ischemic attack (TIA), and cerebral infarction without residual deficits: Secondary | ICD-10-CM

## 2015-04-14 DIAGNOSIS — E785 Hyperlipidemia, unspecified: Secondary | ICD-10-CM | POA: Diagnosis present

## 2015-04-14 DIAGNOSIS — Z87891 Personal history of nicotine dependence: Secondary | ICD-10-CM

## 2015-04-14 DIAGNOSIS — E118 Type 2 diabetes mellitus with unspecified complications: Secondary | ICD-10-CM | POA: Diagnosis not present

## 2015-04-14 DIAGNOSIS — I2510 Atherosclerotic heart disease of native coronary artery without angina pectoris: Secondary | ICD-10-CM | POA: Diagnosis present

## 2015-04-14 DIAGNOSIS — E1165 Type 2 diabetes mellitus with hyperglycemia: Secondary | ICD-10-CM | POA: Diagnosis present

## 2015-04-14 DIAGNOSIS — Z833 Family history of diabetes mellitus: Secondary | ICD-10-CM | POA: Diagnosis not present

## 2015-04-14 DIAGNOSIS — L8990 Pressure ulcer of unspecified site, unspecified stage: Secondary | ICD-10-CM | POA: Insufficient documentation

## 2015-04-14 DIAGNOSIS — I639 Cerebral infarction, unspecified: Secondary | ICD-10-CM | POA: Diagnosis present

## 2015-04-14 DIAGNOSIS — Z79899 Other long term (current) drug therapy: Secondary | ICD-10-CM | POA: Diagnosis not present

## 2015-04-14 DIAGNOSIS — N319 Neuromuscular dysfunction of bladder, unspecified: Secondary | ICD-10-CM | POA: Diagnosis present

## 2015-04-14 DIAGNOSIS — E0800 Diabetes mellitus due to underlying condition with hyperosmolarity without nonketotic hyperglycemic-hyperosmolar coma (NKHHC): Secondary | ICD-10-CM | POA: Diagnosis present

## 2015-04-14 DIAGNOSIS — I5032 Chronic diastolic (congestive) heart failure: Secondary | ICD-10-CM | POA: Diagnosis present

## 2015-04-14 DIAGNOSIS — E669 Obesity, unspecified: Secondary | ICD-10-CM | POA: Diagnosis not present

## 2015-04-14 DIAGNOSIS — E1142 Type 2 diabetes mellitus with diabetic polyneuropathy: Secondary | ICD-10-CM | POA: Diagnosis present

## 2015-04-14 DIAGNOSIS — L89151 Pressure ulcer of sacral region, stage 1: Secondary | ICD-10-CM | POA: Diagnosis present

## 2015-04-14 DIAGNOSIS — R410 Disorientation, unspecified: Secondary | ICD-10-CM | POA: Diagnosis present

## 2015-04-14 DIAGNOSIS — I10 Essential (primary) hypertension: Secondary | ICD-10-CM | POA: Diagnosis present

## 2015-04-14 DIAGNOSIS — N390 Urinary tract infection, site not specified: Secondary | ICD-10-CM

## 2015-04-14 DIAGNOSIS — G4733 Obstructive sleep apnea (adult) (pediatric): Secondary | ICD-10-CM | POA: Diagnosis present

## 2015-04-14 DIAGNOSIS — L03116 Cellulitis of left lower limb: Secondary | ICD-10-CM | POA: Diagnosis not present

## 2015-04-14 DIAGNOSIS — Z794 Long term (current) use of insulin: Secondary | ICD-10-CM

## 2015-04-14 DIAGNOSIS — E1169 Type 2 diabetes mellitus with other specified complication: Secondary | ICD-10-CM | POA: Diagnosis not present

## 2015-04-14 DIAGNOSIS — K5900 Constipation, unspecified: Secondary | ICD-10-CM | POA: Diagnosis not present

## 2015-04-14 DIAGNOSIS — T83198A Other mechanical complication of other urinary devices and implants, initial encounter: Secondary | ICD-10-CM | POA: Diagnosis not present

## 2015-04-14 DIAGNOSIS — T82519A Breakdown (mechanical) of unspecified cardiac and vascular devices and implants, initial encounter: Secondary | ICD-10-CM | POA: Diagnosis not present

## 2015-04-14 LAB — URINALYSIS, ROUTINE W REFLEX MICROSCOPIC
BILIRUBIN URINE: NEGATIVE
Glucose, UA: 500 mg/dL — AB
KETONES UR: NEGATIVE mg/dL
Nitrite: NEGATIVE
PROTEIN: 100 mg/dL — AB
Specific Gravity, Urine: 1.011 (ref 1.005–1.030)
Urobilinogen, UA: 0.2 mg/dL (ref 0.0–1.0)
pH: 5 (ref 5.0–8.0)

## 2015-04-14 LAB — CBC WITH DIFFERENTIAL/PLATELET
Basophils Absolute: 0.1 10*3/uL (ref 0.0–0.1)
Basophils Relative: 1 % (ref 0–1)
Eosinophils Absolute: 0.8 10*3/uL — ABNORMAL HIGH (ref 0.0–0.7)
Eosinophils Relative: 6 % — ABNORMAL HIGH (ref 0–5)
HCT: 42.2 % (ref 39.0–52.0)
Hemoglobin: 14.5 g/dL (ref 13.0–17.0)
Lymphocytes Relative: 12 % (ref 12–46)
Lymphs Abs: 1.6 10*3/uL (ref 0.7–4.0)
MCH: 30 pg (ref 26.0–34.0)
MCHC: 34.4 g/dL (ref 30.0–36.0)
MCV: 87.4 fL (ref 78.0–100.0)
Monocytes Absolute: 0.7 10*3/uL (ref 0.1–1.0)
Monocytes Relative: 5 % (ref 3–12)
Neutro Abs: 9.9 10*3/uL — ABNORMAL HIGH (ref 1.7–7.7)
Neutrophils Relative %: 76 % (ref 43–77)
Platelets: 285 10*3/uL (ref 150–400)
RBC: 4.83 MIL/uL (ref 4.22–5.81)
RDW: 13 % (ref 11.5–15.5)
WBC: 13.1 10*3/uL — ABNORMAL HIGH (ref 4.0–10.5)

## 2015-04-14 LAB — GLUCOSE, CAPILLARY
GLUCOSE-CAPILLARY: 208 mg/dL — AB (ref 65–99)
Glucose-Capillary: 301 mg/dL — ABNORMAL HIGH (ref 65–99)

## 2015-04-14 LAB — COMPREHENSIVE METABOLIC PANEL
ALBUMIN: 3.5 g/dL (ref 3.5–5.0)
ALT: 11 U/L — ABNORMAL LOW (ref 17–63)
ANION GAP: 8 (ref 5–15)
AST: 15 U/L (ref 15–41)
Alkaline Phosphatase: 102 U/L (ref 38–126)
BUN: 15 mg/dL (ref 6–20)
CALCIUM: 8.8 mg/dL — AB (ref 8.9–10.3)
CO2: 26 mmol/L (ref 22–32)
Chloride: 96 mmol/L — ABNORMAL LOW (ref 101–111)
Creatinine, Ser: 0.82 mg/dL (ref 0.61–1.24)
GFR calc Af Amer: >60 mL/min (ref >60)
Glucose, Bld: 358 mg/dL — ABNORMAL HIGH (ref 65–99)
POTASSIUM: 4.6 mmol/L (ref 3.5–5.1)
Sodium: 130 mmol/L — ABNORMAL LOW (ref 135–145)
Total Bilirubin: 0.8 mg/dL (ref 0.3–1.2)
Total Protein: 7.6 g/dL (ref 6.5–8.1)

## 2015-04-14 LAB — LACTIC ACID, PLASMA
LACTIC ACID, VENOUS: 1.5 mmol/L (ref 0.5–2.0)
Lactic Acid, Venous: 1.2 mmol/L (ref 0.5–2.0)

## 2015-04-14 LAB — URINE MICROSCOPIC-ADD ON

## 2015-04-14 MED ORDER — ACETAMINOPHEN 650 MG RE SUPP
650.0000 mg | Freq: Four times a day (QID) | RECTAL | Status: DC | PRN
Start: 2015-04-14 — End: 2015-04-18

## 2015-04-14 MED ORDER — OXYBUTYNIN CHLORIDE 5 MG PO TABS: 5.0000 mg | ORAL_TABLET | Freq: Three times a day (TID) | ORAL | Status: DC | PRN

## 2015-04-14 MED ORDER — LEVOFLOXACIN IN D5W 750 MG/150ML IV SOLN
750.0000 mg | INTRAVENOUS | Status: DC
  Administered 2015-04-14 – 2015-04-16 (×3): 750 mg via INTRAVENOUS
  Filled 2015-04-14 (×4): qty 150

## 2015-04-14 MED ORDER — SODIUM CHLORIDE 0.9 % IJ SOLN
3.0000 mL | Freq: Two times a day (BID) | INTRAMUSCULAR | Status: DC
  Administered 2015-04-14 – 2015-04-18 (×4): 3 mL via INTRAVENOUS

## 2015-04-14 MED ORDER — ONDANSETRON HCL 4 MG PO TABS: 4.0000 mg | ORAL_TABLET | Freq: Four times a day (QID) | ORAL | Status: DC | PRN

## 2015-04-14 MED ORDER — POLYETHYLENE GLYCOL 3350 17 G PO PACK
17.0000 g | PACK | Freq: Every day | ORAL | Status: DC | PRN
Start: 2015-04-14 — End: 2015-04-18

## 2015-04-14 MED ORDER — VANCOMYCIN HCL IN DEXTROSE 1-5 GM/200ML-% IV SOLN
1000.0000 mg | Freq: Once | INTRAVENOUS | Status: AC
  Administered 2015-04-14: 1000 mg via INTRAVENOUS
  Filled 2015-04-14: qty 200

## 2015-04-14 MED ORDER — LISINOPRIL 5 MG PO TABS
5.0000 mg | ORAL_TABLET | Freq: Every day | ORAL | Status: DC
  Administered 2015-04-15 – 2015-04-18 (×4): 5 mg via ORAL
  Filled 2015-04-14 (×4): qty 1

## 2015-04-14 MED ORDER — ONDANSETRON HCL 4 MG/2ML IJ SOLN: 4.0000 mg | Freq: Three times a day (TID) | INTRAMUSCULAR | Status: DC | PRN

## 2015-04-14 MED ORDER — PANTOPRAZOLE SODIUM 40 MG PO TBEC
40.0000 mg | DELAYED_RELEASE_TABLET | Freq: Every day | ORAL | Status: DC
  Administered 2015-04-15 – 2015-04-18 (×4): 40 mg via ORAL
  Filled 2015-04-14 (×5): qty 1

## 2015-04-14 MED ORDER — ACETAMINOPHEN 325 MG PO TABS
650.0000 mg | ORAL_TABLET | Freq: Four times a day (QID) | ORAL | Status: DC | PRN
  Filled 2015-04-14: qty 2

## 2015-04-14 MED ORDER — VANCOMYCIN HCL 10 G IV SOLR
1500.0000 mg | Freq: Once | INTRAVENOUS | Status: AC
  Administered 2015-04-14: 1500 mg via INTRAVENOUS
  Filled 2015-04-14: qty 1500

## 2015-04-14 MED ORDER — NITROGLYCERIN 0.4 MG SL SUBL: 0.4000 mg | SUBLINGUAL_TABLET | SUBLINGUAL | Status: DC | PRN

## 2015-04-14 MED ORDER — ASPIRIN EC 81 MG PO TBEC
81.0000 mg | DELAYED_RELEASE_TABLET | Freq: Every morning | ORAL | Status: DC
  Administered 2015-04-15 – 2015-04-18 (×4): 81 mg via ORAL
  Filled 2015-04-14 (×4): qty 1

## 2015-04-14 MED ORDER — INSULIN NPH (HUMAN) (ISOPHANE) 100 UNIT/ML ~~LOC~~ SUSP
20.0000 [IU] | Freq: Two times a day (BID) | SUBCUTANEOUS | Status: DC
  Administered 2015-04-14 – 2015-04-18 (×8): 20 [IU] via SUBCUTANEOUS
  Filled 2015-04-14: qty 10

## 2015-04-14 MED ORDER — SODIUM CHLORIDE 0.9 % IV SOLN
INTRAVENOUS | Status: DC
  Administered 2015-04-14: 17:00:00 via INTRAVENOUS

## 2015-04-14 MED ORDER — HYDROCODONE-ACETAMINOPHEN 5-325 MG PO TABS
1.0000 | ORAL_TABLET | Freq: Four times a day (QID) | ORAL | Status: DC | PRN
  Administered 2015-04-14 – 2015-04-18 (×8): 1 via ORAL
  Filled 2015-04-14 (×8): qty 1

## 2015-04-14 MED ORDER — METOPROLOL TARTRATE 50 MG PO TABS
50.0000 mg | ORAL_TABLET | Freq: Two times a day (BID) | ORAL | Status: DC
  Administered 2015-04-14 – 2015-04-18 (×8): 50 mg via ORAL
  Filled 2015-04-14 (×8): qty 1

## 2015-04-14 MED ORDER — INSULIN ASPART 100 UNIT/ML ~~LOC~~ SOLN
0.0000 [IU] | Freq: Three times a day (TID) | SUBCUTANEOUS | Status: DC
  Administered 2015-04-14: 11 [IU] via SUBCUTANEOUS
  Administered 2015-04-15: 2 [IU] via SUBCUTANEOUS
  Administered 2015-04-15: 3 [IU] via SUBCUTANEOUS
  Administered 2015-04-15 – 2015-04-16 (×4): 2 [IU] via SUBCUTANEOUS
  Administered 2015-04-17 (×2): 3 [IU] via SUBCUTANEOUS
  Administered 2015-04-17: 2 [IU] via SUBCUTANEOUS
  Administered 2015-04-18 (×2): 3 [IU] via SUBCUTANEOUS

## 2015-04-14 MED ORDER — LIDOCAINE HCL 2 % EX GEL
CUTANEOUS | Status: AC
  Administered 2015-04-14: 1
  Filled 2015-04-14: qty 10

## 2015-04-14 MED ORDER — SODIUM CHLORIDE 0.9 % IV BOLUS (SEPSIS)
1000.0000 mL | Freq: Once | INTRAVENOUS | Status: AC
  Administered 2015-04-14: 1000 mL via INTRAVENOUS

## 2015-04-14 MED ORDER — ONDANSETRON HCL 4 MG/2ML IJ SOLN: 4.0000 mg | Freq: Four times a day (QID) | INTRAMUSCULAR | Status: DC | PRN

## 2015-04-14 MED ORDER — ALBUTEROL SULFATE (2.5 MG/3ML) 0.083% IN NEBU: 2.5000 mg | INHALATION_SOLUTION | RESPIRATORY_TRACT | Status: DC | PRN

## 2015-04-14 MED ORDER — LEVOFLOXACIN IN D5W 500 MG/100ML IV SOLN
500.0000 mg | Freq: Once | INTRAVENOUS | Status: DC
  Filled 2015-04-14: qty 100

## 2015-04-14 MED ORDER — ACETAMINOPHEN 325 MG PO TABS: 650.0000 mg | ORAL_TABLET | ORAL | Status: DC | PRN

## 2015-04-14 MED ORDER — VANCOMYCIN HCL 10 G IV SOLR
2500.0000 mg | Freq: Once | INTRAVENOUS | Status: DC
  Filled 2015-04-14: qty 2500

## 2015-04-14 MED ORDER — VANCOMYCIN HCL 10 G IV SOLR
1250.0000 mg | Freq: Two times a day (BID) | INTRAVENOUS | Status: DC
  Administered 2015-04-15 – 2015-04-17 (×5): 1250 mg via INTRAVENOUS
  Filled 2015-04-14 (×6): qty 1250

## 2015-04-14 MED ORDER — HEPARIN SODIUM (PORCINE) 5000 UNIT/ML IJ SOLN
5000.0000 [IU] | Freq: Three times a day (TID) | INTRAMUSCULAR | Status: DC
  Administered 2015-04-14 – 2015-04-18 (×11): 5000 [IU] via SUBCUTANEOUS
  Filled 2015-04-14 (×12): qty 1

## 2015-04-14 MED ORDER — BISACODYL 10 MG RE SUPP: 10.0000 mg | Freq: Every day | RECTAL | Status: DC | PRN

## 2015-04-14 MED ORDER — SENNA 8.6 MG PO TABS: 1.0000 | ORAL_TABLET | Freq: Every day | ORAL | Status: DC | PRN

## 2015-04-14 NOTE — ED Notes (Signed)
Bed: WA21 Expected date:  Expected time:  Means of arrival:  Comments: abd pain, foley

## 2015-04-14 NOTE — ED Notes (Addendum)
Pt comes in today with EMS with a c/o foley catheter issues. Pt states that he has had decreased output in his foley and pelvic pain. Catheter has noted mucous and sediment to the bag. Pt smells of feces. Pt states that he stays alone in his house with no help to clean up house. EMS stated that the house was smelling of feces and the pt has noted feces to his feet.

## 2015-04-14 NOTE — Progress Notes (Signed)
Patient refused CPAP for tonight. Rt will continue to monitor as needed.

## 2015-04-14 NOTE — ED Notes (Signed)
Urine collected post foley removal.

## 2015-04-14 NOTE — ED Provider Notes (Signed)
Medical screening examination/treatment/procedure(s) were performed by non-physician practitioner and as supervising physician I was immediately available for consultation/collaboration.   EKG Interpretation None      69 yo M w/ a chief complaint of confusion, fevers, purulent drainage from his foley.  Mildly confused on exam, purulent drainage from foley.  Likely UTI, mild tachycardia on arrival  Will admit to the hospital for iv abx.   On my exam patient is mildly confused. No noted abdominal tenderness. Patient morbidly obese. Sacrum with stage I ulcer. Chronic appearing edema to bilateral lower extremities with scaling. Patient generally filthy.  [name], DO 04/14/15 1527

## 2015-04-14 NOTE — H&P (Signed)
Patient Demographics  [name], is a 69 y.o. male  MRN: [id number]   DOB - 06-26-1946  Admit Date - 04/14/2015  Outpatient Primary MD for the patient is Irven Shelling, MD   With History of -  Past Medical History  Diagnosis Date  . Obese   . Hypertension   . Diabetic neuropathy, painful   . Chronic indwelling Foley catheter   . Bladder spasms   . Hyperlipidemia   . GERD (gastroesophageal reflux disease)   . Neurogenic bladder   . CAD (coronary artery disease)   . PONV (postoperative nausea and vomiting)   . CHF (congestive heart failure)   . DVT (deep venous thrombosis) 1980's    LLE  . Type II diabetes mellitus   . Diabetic diarrhea   . Pneumonia 1999  . OSA (obstructive sleep apnea)     "they wanted me to wear a mask; I couldn't" (11/10/2014)  . Arthritis     "hands, ankles, knees" (11/10/2014)  . Chronic lower back pain   . Stroke 10/2011; 06/2014    right hand numbness/notes 10/20/2011, pt not sure he had stroke in 2013; "right hand weaker and right face not quite right since" (11/10/2014)      Past Surgical History  Procedure Laterality Date  . Gastroplasty    . Total knee arthroplasty Right 1990's  . Left heart catheterization with coronary angiogram N/A 10/24/2011    Procedure: LEFT HEART CATHETERIZATION WITH CORONARY ANGIOGRAM;  Surgeon: Candee Furbish, MD;  Location: [facility] CATH LAB;  Service: Cardiovascular;  Laterality: N/A;  right radial artery approach  . Cholecystectomy open  1970's?  . Joint replacement    . Carpal tunnel release Bilateral ~ 2003-2004    in for   Chief Complaint  Patient presents with  . Urinary Tract Infection     HPI  [name]  is a 69 y.o. male, morbidly obese male with multiple medical problems putting diabetes, hypertension, sleep apnea, peripheral neuropathy from diabetes, chronic indwelling Foley catheter from diabetes-related neurogenic bladder, dyslipidemia, GERD, CAD and prior stroke, once with complaints of  increased confusion and altered mental status, purulent discharge from Foley catheter, NAD patient was initially confused, patient was noticed to have purulent discharge from his Foley catheter, Foley catheter was changed in ED, with 6-700 mL came out upon insertion of new Foley catheter, patient was a febrile, but workup was significant for leukocytosis, and positive urinalysis, started empirically on IV vancomycin and levofloxacin in ED given history urine culture in the past growing MRSA and Citrobacter, CT head with no acute finding, by the time of my examination patient was back at baseline awake alert oriented 3, denies any chest pain, fever, cough or productive sputum. - ED staff report patient having an episode of mild unresponsiveness during change of Foley catheter     Review of Systems    In addition to the HPI above,  No Fever-chills, No Headache, No changes with Vision or hearing, No problems swallowing food or Liquids, No Chest pain, Cough or Shortness of Breath, No Abdominal pain, No Nausea or Vomreport generally has constipation, but last bowel movement was yesterday No Blood in stool  has a chronic indwelling Foley catheter, with purulent urine No new skin rashes or bruises, No new joints pains-aches,  No new weakness, tingling, numbness in any extremity, chronic right-sided weakness from previous CVA  No recent weight gain or loss, No polyuria, polydypsia or polyphagia, No significant Mental Stressors.  A full  10 point Review of Systems was done, except as stated above, all other Review of Systems were negative.   Social History History  Substance Use Topics  . Smoking status: Former Smoker -- 2.00 packs/day for 20 years    Types: Cigarettes    Quit date: 09/10/1976  . Smokeless tobacco: Never Used  . 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  Hypertension Mother   . Diabetes type I Father   . Cancer Brother      Testicular Cancer  . Cancer Brother     Leukemia     Prior to Admission medications   Medication Sig Start Date End Date Taking? Authorizing Provider  acetaminophen (TYLENOL) 325 MG tablet Take 2 tablets (650 mg total) by mouth every 4 (four) hours as needed for headache or mild pain. 12/08/14  Yes Erlene Quan, PA-C  aspirin EC 81 MG tablet Take 81 mg by mouth every morning.    Yes Historical Provider, MD  calcium carbonate (TUMS - DOSED IN MG ELEMENTAL CALCIUM) 500 MG chewable tablet Chew 1 tablet (200 mg of elemental calcium total) by mouth 3 (three) times daily as needed for indigestion or heartburn. 11/19/14  Yes Debbe Odea, MD  diphenhydrAMINE (SOMINEX) 25 MG tablet Take 25 mg by mouth at bedtime as needed for sleep.   Yes Historical Provider, MD  diphenoxylate-atropine (LOMOTIL) 2.5-0.025 MG per tablet Take 1 tablet by mouth 3 (three) times daily as needed. For diarrhea 09/06/14  Yes Tiffany L Reed, DO  fexofenadine (ALLEGRA) 180 MG tablet Take 180 mg by mouth at bedtime as needed for allergies.    Yes Historical Provider, MD  furosemide (LASIX) 40 MG tablet Take 1 tablet (40 mg total) by mouth daily. 04/03/14  Yes Lavone Orn, MD  HUMULIN N 100 UNIT/ML injection Inject 40 Units into the skin 2 (two) times daily. 02/21/15  Yes Historical Provider, MD  HYDROcodone-acetaminophen (NORCO/VICODIN) 5-325 MG per tablet Take 1 tablet by mouth 2 (two) times daily as needed for severe pain. 11/14/14  Yes Everlene Balls, MD  lisinopril (PRINIVIL,ZESTRIL) 5 MG tablet TAKE 1 TABLET (5 MG TOTAL) BY MOUTH DAILY. 01/24/15  Yes Jerline Pain, MD  metoprolol (LOPRESSOR) 50 MG tablet Take 1 tablet (50 mg total) by mouth 2 (two) times daily. 12/08/14  Yes Luke K Kilroy, PA-C  nitroGLYCERIN (NITROSTAT) 0.4 MG SL tablet Place 1 tablet (0.4 mg total) under the tongue every 5 (five) minutes as needed for chest pain. 12/08/14  Yes Luke K Kilroy, PA-C   omeprazole (PRILOSEC) 20 MG capsule Take 20 mg by mouth daily.   Yes Historical Provider, MD  oxybutynin (DITROPAN) 5 MG tablet Take 5 mg by mouth 3 (three) times daily as needed for bladder spasms.   Yes Historical Provider, MD  Sennosides 25 MG TABS Take 25 mg by mouth daily as needed (for constipation).   Yes Historical Provider, MD    Allergies  Allergen Reactions  . Ace Inhibitors Swelling    Pt tolerates lisinopril  . Lipitor [Atorvastatin Calcium] Swelling  . Metformin And Related Swelling  . Cefadroxil Hives  . Cephalexin Hives  . Morphine And Related Other (See Comments)    Sweating, feels like is "in rocky boat."  . Robaxin [Methocarbamol] Other (See Comments)    Feels like he is shaky    Physical Exam  Vitals  Blood  pressure 124/55, pulse 86, temperature 98.3 F (36.8 C), temperature source Oral, resp. rate 19, height 6\' 1"  (1.854 m), weight 158.759 kg (350 lb), SpO2 97 %.   1. General morbidly obese male lying in bed in NAD,    2. Normal affect and insight, Not Suicidal or Homicidal, Awake Alert, Oriented X 3.  3. No F.N deficits, ALL C.Nerves Intact, chronic right-sided weakness   4. Ears and Eyes appear Normal, Conjunctivae clear, PERRLA. Moist Oral Mucosa.  5. Supple Neck, No JVD, No cervical lymphadenopathy appriciated, No Carotid Bruits.  6. Symmetrical Chest wall movement, Good air movement bilaterally, CTAB.  7. RRR, No Gallops, Rubs or Murmurs, No Parasternal Heave.  8. Positive Bowel Sounds, Abdomen Soft, No tenderness, No organomegaly appriciated,No rebound -guarding or rigidity. indwelling Foley catheter   9.  No Cyanosis, Normal Skin Turgor, fungal skin rash and abdominal folds  10. Good muscle tone,  joints appear normal , no effusions,   11. No Palpable Lymph Nodes in Neck or Axillae    Data Review  CBC  Recent Labs Lab 04/14/15 1227  WBC 13.1*  HGB 14.5  HCT 42.2  PLT 285  MCV 87.4  MCH 30.0  MCHC 34.4  RDW 13.0  LYMPHSABS  1.6  MONOABS 0.7  EOSABS 0.8*  BASOSABS 0.1   ------------------------------------------------------------------------------------------------------------------  Chemistries   Recent Labs Lab 04/14/15 1227  NA 130*  K 4.6  CL 96*  CO2 26  GLUCOSE 358*  BUN 15  CREATININE 0.82  CALCIUM 8.8*  AST 15  ALT 11*  ALKPHOS 102  BILITOT 0.8   ------------------------------------------------------------------------------------------------------------------ estimated creatinine clearance is 137.9 mL/min (by C-G formula based on Cr of 0.82). ------------------------------------------------------------------------------------------------------------------ No results for input(s): TSH, T4TOTAL, T3FREE, THYROIDAB in the last 72 hours.  Invalid input(s): FREET3   Coagulation profile No results for input(s): INR, PROTIME in the last 168 hours. ------------------------------------------------------------------------------------------------------------------- No results for input(s): DDIMER in the last 72 hours. -------------------------------------------------------------------------------------------------------------------  Cardiac Enzymes No results for input(s): CKMB, TROPONINI, MYOGLOBIN in the last 168 hours.  Invalid input(s): CK ------------------------------------------------------------------------------------------------------------------ Invalid input(s): POCBNP   ---------------------------------------------------------------------------------------------------------------  Urinalysis    Component Value Date/Time   COLORURINE YELLOW 04/14/2015 1302   APPEARANCEUR CLOUDY* 04/14/2015 1302   LABSPEC 1.011 04/14/2015 1302   PHURINE 5.0 04/14/2015 1302   GLUCOSEU 500* 04/14/2015 1302   HGBUR LARGE* 04/14/2015 1302   BILIRUBINUR NEGATIVE 04/14/2015 1302   KETONESUR NEGATIVE 04/14/2015 1302   PROTEINUR 100* 04/14/2015 1302   UROBILINOGEN 0.2 04/14/2015 1302   NITRITE  NEGATIVE 04/14/2015 1302   LEUKOCYTESUR MODERATE* 04/14/2015 1302    ----------------------------------------------------------------------------------------------------------------  Imaging results:   Ct Head Wo Contrast  04/14/2015   CLINICAL DATA:  Altered mental status.  Urinary tract infection.  EXAM: CT HEAD WITHOUT CONTRAST  TECHNIQUE: Contiguous axial images were obtained from the base of the skull through the vertex without intravenous contrast.  COMPARISON:  Head MRI and CT 11/15/2014  FINDINGS: Symmetric mineralization is again seen in the basal ganglia and thalami bilaterally. Ventricles and sulci are within normal limits for age. Chronic lacunar infarct is again seen in the left caudate. There is no evidence of acute cortical infarct, intracranial hemorrhage, mass, midline shift, or extra-axial fluid collection.  Orbits are unremarkable. The visualized paranasal sinuses and mastoid air cells are clear.  IMPRESSION: No evidence of acute intracranial abnormality.   Electronically Signed   By: Logan Bores   On: 04/14/2015 14:59        Assessment & Plan  Principal  Problem:   UTI (lower urinary tract infection) Active Problems:   DM (diabetes mellitus) with complications   OSA (obstructive sleep apnea)   Hypertension   CAD- known RCA disease- medical Rx   CVA (cerebral infarction)   Syncope   Chronic diastolic CHF (congestive heart failure)   Mental confusion  Altered mental status  - Most likely related to his UTI, no acute finding on CT head, back to baseline    UTI  - Patient with chronic indwelling catheter given neurogenic bladder for few years ,  Foley catheter was changed in ED , most recent urine culture in April 2016 growing MRSA and Citrobacter , follow on urine culture , meanwhile continue with IV vancomycin and levofloxacin, will do escalate antibiotics in 24 hours if patient remains stable .  Diabetes mellitus  - Uncontrolled, will start an insulin sliding  scale, will check glycohemoglobin A1c, will decrease Humulin dose from 40 twice a day to 20 twice a day (as brother reports episode of hypoglycemia 3 weeks ago )   obstructive sleep apnea  - Continue C Pap   Hypertension  - Continue home Medication including lisinopril and beta blockers   CAD - Denies any chest pain or shortness of breath, continue aspirin, metoprolol and lisinopril  Chronic diastolic CHF - Appears compensated, resume Lasix when stable  History of CVA - Continue with aspirin  Syncope - Patient with known history of multiple episodes of syncope in the past, current episode most likely related to vasovagal syncope during change of Foley catheter, will monitor on telemetry, most recent echo in March 2016 showing grade 1 diastolic dysfunction with EF 50-55%  DVT Prophylaxis Heparin -    AM Labs Ordered, also please review Full Orders  Family Communication: Admission, patients condition and plan of care including tests being ordered have been discussed with the patient and brother over the phone Alesia Morin [phone] (lives in [place], retired Librarian, academic)  who indicate understanding and agree with the plan and Code Status.  Code Status Full  Likely DC to  : Will consult PT may need placement , consult social worker as well as brother is concerned about patient's safety .  Condition GUARDED    Time spent in minutes : 60 minutes     Trenia Tennyson M.D on 04/14/2015 at 3:28 PM  Between 7am to 7pm - Pager - [phone]  After 7pm go to [email or web address] - password [email or web address]  And look for the night coverage person covering me after hours  [facility] Office  [phone]

## 2015-04-14 NOTE — ED Provider Notes (Signed)
CSN: [id number]     Arrival date & time 04/14/15  1149 History   First MD Initiated Contact with Patient 04/14/15 1203     Chief Complaint  Patient presents with  . Urinary Tract Infection     (Consider location/radiation/quality/duration/timing/severity/associated sxs/prior Treatment) Patient is a 69 y.o. male presenting with urinary tract infection. The history is provided by the patient and the EMS personnel. No language interpreter was used.  Urinary Tract Infection This is a new problem. The current episode started 1 to 4 weeks ago. The problem occurs constantly. The problem has been gradually worsening. Associated symptoms include fatigue, a fever and urinary symptoms. Pertinent negatives include no abdominal pain. Nothing aggravates the symptoms. He has tried nothing for the symptoms. The treatment provided no relief.  Pt reports he has pus draining into his foley bag.  Pt complains of weakness.  Pt reports declining mentally.  (EMS reports pt and home covered in feces)    Past Medical History  Diagnosis Date  . Obese   . Hypertension   . Diabetic neuropathy, painful   . Chronic indwelling Foley catheter   . Bladder spasms   . Hyperlipidemia   . GERD (gastroesophageal reflux disease)   . Neurogenic bladder   . CAD (coronary artery disease)   . PONV (postoperative nausea and vomiting)   . CHF (congestive heart failure)   . DVT (deep venous thrombosis) 1980's    LLE  . Type II diabetes mellitus   . Diabetic diarrhea   . Pneumonia 1999  . OSA (obstructive sleep apnea)     "they wanted me to wear a mask; I couldn't" (11/10/2014)  . Arthritis     "hands, ankles, knees" (11/10/2014)  . Chronic lower back pain   . Stroke 10/2011; 06/2014    right hand numbness/notes 10/20/2011, pt not sure he had stroke in 2013; "right hand weaker and right face not quite right since" (11/10/2014)   Past Surgical History  Procedure Laterality Date  . Gastroplasty    . Total knee arthroplasty Right  1990's  . Left heart catheterization with coronary angiogram N/A 10/24/2011    Procedure: LEFT HEART CATHETERIZATION WITH CORONARY ANGIOGRAM;  Surgeon: Candee Furbish, MD;  Location: [facility] CATH LAB;  Service: Cardiovascular;  Laterality: N/A;  right radial artery approach  . Cholecystectomy open  1970's?  . Joint replacement    . Carpal tunnel release Bilateral ~ 2003-2004   Family History  Problem Relation Age of Onset  . Hypertension Mother   . Diabetes type I Father   . Cancer Brother      Testicular Cancer  . Cancer Brother     Leukemia   History  Substance Use Topics  . Smoking status: Former Smoker -- 2.00 packs/day for 20 years    Types: Cigarettes    Quit date: 09/10/1976  . Smokeless tobacco: Never Used  . 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  Gastrointestinal: Negative for abdominal pain.  All other systems reviewed and are negative.     Allergies  Ace inhibitors; Lipitor; Metformin and related; Cefadroxil; Cephalexin; Morphine and related; and Robaxin  Home Medications   Prior to Admission medications   Medication Sig Start Date End Date Taking? Authorizing Provider  acetaminophen (TYLENOL) 325 MG tablet Take 2 tablets (650 mg total) by mouth every 4 (four) hours as needed for headache or mild pain.  12/08/14   Erlene Quan, PA-C  aspirin EC 81 MG tablet Take 81 mg by mouth every morning.     Historical Provider, MD  calcium carbonate (TUMS - DOSED IN MG ELEMENTAL CALCIUM) 500 MG chewable tablet Chew 1 tablet (200 mg of elemental calcium total) by mouth 3 (three) times daily as needed for indigestion or heartburn. 11/19/14   Debbe Odea, MD  dicyclomine (BENTYL) 10 MG capsule Take 1 capsule (10 mg total) by mouth 3 (three) times daily before meals. 11/19/14   Debbe Odea, MD  diphenhydrAMINE (SOMINEX) 25 MG tablet Take 25 mg by mouth at bedtime as needed  for sleep.    Historical Provider, MD  diphenoxylate-atropine (LOMOTIL) 2.5-0.025 MG per tablet Take 1 tablet by mouth 3 (three) times daily as needed. For diarrhea 09/06/14   Tiffany L Reed, DO  fexofenadine (ALLEGRA) 180 MG tablet Take 180 mg by mouth at bedtime as needed for allergies.     Historical Provider, MD  furosemide (LASIX) 40 MG tablet Take 1 tablet (40 mg total) by mouth daily. 04/03/14   Lavone Orn, MD  HUMULIN 70/30 (70-30) 100 UNIT/ML injection Inject 40-50 Units into the skin 2 (two) times daily.  10/15/14   Historical Provider, MD  HYDROcodone-acetaminophen (NORCO/VICODIN) 5-325 MG per tablet Take 1 tablet by mouth 2 (two) times daily as needed for severe pain. 11/14/14   Everlene Balls, MD  lacosamide 150 MG TABS Take 1 tablet (150 mg total) by mouth 2 (two) times daily. 11/19/14   Debbe Odea, MD  lisinopril (PRINIVIL,ZESTRIL) 5 MG tablet Take 5 mg by mouth daily. 09/28/14   Historical Provider, MD  lisinopril (PRINIVIL,ZESTRIL) 5 MG tablet TAKE 1 TABLET (5 MG TOTAL) BY MOUTH DAILY. 01/24/15   Jerline Pain, MD  metoprolol (LOPRESSOR) 50 MG tablet Take 1 tablet (50 mg total) by mouth 2 (two) times daily. 12/08/14   Erlene Quan, PA-C  nitroGLYCERIN (NITROSTAT) 0.4 MG SL tablet Place 1 tablet (0.4 mg total) under the tongue every 5 (five) minutes as needed for chest pain. 12/08/14   Erlene Quan, PA-C  omeprazole (PRILOSEC) 20 MG capsule Take 20 mg by mouth daily.    Historical Provider, MD  oxybutynin (DITROPAN) 5 MG tablet Take 5 mg by mouth 3 (three) times daily as needed for bladder spasms.    Historical Provider, MD  Sennosides 25 MG TABS Take 25 mg by mouth daily as needed (for constipation).    Historical Provider, MD   BP 119/58 mmHg  Pulse 88  Temp(Src) 98.3 F (36.8 C) (Oral)  Resp 18  Ht 6\' 1"  (1.854 m)  Wt 350 lb (158.759 kg)  BMI 46.19 kg/m2  SpO2 97% Physical Exam  Constitutional: He is oriented to person, place, and time. He appears well-developed and  well-nourished.  HENT:  Head: Normocephalic.  Right Ear: External ear normal.  Left Ear: External ear normal.  Mouth/Throat: Oropharynx is clear and moist.  Eyes: Conjunctivae and EOM are normal. Pupils are equal, round, and reactive to light.  Neck: Normal range of motion.  Cardiovascular: Normal rate.   Pulmonary/Chest: Effort normal.  Abdominal: Soft. He exhibits no distension.  Musculoskeletal: Normal range of motion.  Neurological: He is alert and oriented to person, place, and time.  Skin:  Pt filthy, covered in stool, stool on feet,   Psychiatric: He has a normal mood and affect.  Nursing note and vitals reviewed.   ED Course  Procedures (including critical care time) Labs Review Labs  Reviewed  COMPREHENSIVE METABOLIC PANEL - Abnormal; Notable for the following:    Sodium 130 (*)    Chloride 96 (*)    Glucose, Bld 358 (*)    Calcium 8.8 (*)    ALT 11 (*)    All other components within normal limits  CBC WITH DIFFERENTIAL/PLATELET - Abnormal; Notable for the following:    WBC 13.1 (*)    Neutro Abs 9.9 (*)    Eosinophils Relative 6 (*)    Eosinophils Absolute 0.8 (*)    All other components within normal limits  URINALYSIS, ROUTINE W REFLEX MICROSCOPIC (NOT AT [facility]) - Abnormal; Notable for the following:    APPearance CLOUDY (*)    Glucose, UA 500 (*)    Hgb urine dipstick LARGE (*)    Protein, ur 100 (*)    Leukocytes, UA MODERATE (*)    All other components within normal limits  URINE MICROSCOPIC-ADD ON - Abnormal; Notable for the following:    Bacteria, UA MANY (*)    All other components within normal limits  CULTURE, BLOOD (ROUTINE X 2)  CULTURE, BLOOD (ROUTINE X 2)  URINE CULTURE  LACTIC ACID, PLASMA  LACTIC ACID, PLASMA    Imaging Review No results found.   EKG Interpretation None      MDM   Final diagnoses:  UTI (lower urinary tract infection)  Altered mental status, unspecified altered mental status type    RN changed foley,  Pt had  over 500cc of urine out, ua is from clean cath specimen.  Pt given IV fluids and vancomycin. Pt has history of chf.  I did not give full sepsis fluid bolus  Pt had episode of verbal unresponsiveness while urine was draining, monitor was normal.  Pt given vancomycin and levaquin.   I spoke to Dr. Maudry Mayhew who will admit.    [place], PA-C 04/14/15 [place], DO 04/14/15 [phone]

## 2015-04-14 NOTE — Progress Notes (Addendum)
ANTIBIOTIC CONSULT NOTE - INITIAL  Pharmacy Consult for vancomycin, Levaquin Indication: UTI - hx MRSA and citrobacter  Allergies  Allergen Reactions  . Ace Inhibitors Swelling    Pt tolerates lisinopril  . Lipitor [Atorvastatin Calcium] Swelling  . Metformin And Related Swelling  . Cefadroxil Hives  . Cephalexin Hives  . Morphine And Related Other (See Comments)    Sweating, feels like is "in rocky boat."  . Robaxin [Methocarbamol] Other (See Comments)    Feels like he is shaky    Patient Measurements: Height: 6\' 1"  (185.4 cm) Weight: (!) 350 lb (158.759 kg) IBW/kg (Calculated) : 79.9  Vital Signs: Temp: 98 F (36.7 C) (08/04 1626) Temp Source: Oral (08/04 1626) BP: 101/45 mmHg (08/04 1626) Pulse Rate: 89 (08/04 1626) Intake/Output from previous day:   Intake/Output from this shift: Total I/O In: 2500 [Other:2500] Out: -   Labs:  Recent Labs  04/14/15 1227  WBC 13.1*  HGB 14.5  PLT 285  CREATININE 0.82   Estimated Creatinine Clearance: 137.9 mL/min (by C-G formula based on Cr of 0.82). No results for input(s): VANCOTROUGH, VANCOPEAK, VANCORANDOM, GENTTROUGH, GENTPEAK, GENTRANDOM, TOBRATROUGH, TOBRAPEAK, TOBRARND, AMIKACINPEAK, AMIKACINTROU, AMIKACIN in the last 72 hours.   Microbiology: No results found for this or any previous visit (from the past 720 hour(s)).  Medical History: Past Medical History  Diagnosis Date  . Obese   . Hypertension   . Diabetic neuropathy, painful   . Chronic indwelling Foley catheter   . Bladder spasms   . Hyperlipidemia   . GERD (gastroesophageal reflux disease)   . Neurogenic bladder   . CAD (coronary artery disease)   . PONV (postoperative nausea and vomiting)   . CHF (congestive heart failure)   . DVT (deep venous thrombosis) 1980's    LLE  . Type II diabetes mellitus   . Diabetic diarrhea   . Pneumonia 1999  . OSA (obstructive sleep apnea)     "they wanted me to wear a mask; I couldn't" (11/10/2014)  . Arthritis      "hands, ankles, knees" (11/10/2014)  . Chronic lower back pain   . Stroke 10/2011; 06/2014    right hand numbness/notes 10/20/2011, pt not sure he had stroke in 2013; "right hand weaker and right face not quite right since" (11/10/2014)    Medications:  Scheduled:  . [START ON 04/15/2015] aspirin EC  81 mg Oral q morning - 10a  . heparin  5,000 Units Subcutaneous 3 times per day  . insulin aspart  0-15 Units Subcutaneous TID WC  . insulin NPH Human  20 Units Subcutaneous BID AC & HS  . levofloxacin (LEVAQUIN) IV  500 mg Intravenous Once  . [START ON 04/15/2015] lisinopril  5 mg Oral Daily  . metoprolol  50 mg Oral BID  . [START ON 04/15/2015] pantoprazole  40 mg Oral Daily  . sodium chloride  3 mL Intravenous Q12H   Infusions:  . sodium chloride     Assessment: 69 yo presents to ER with UTI in patient that has an indwelling catheter d/t neurogenic bladder for few years to start vancomycin and Levaquin for +UA pending cultures per Md orders. Note patient with urine culture from 12/23/14 that grew MRSA and citrobacter. PMH also includes obesity, DM, HTN, HLP, CAD, CHF, DVT, PNA, OSA, and GERD. At baseline patient is afebrile, slightly elevated WBC, and SCr of 0.82 with est CrCl N 89  Goal of Therapy:  Vancomycin trough level 15-20 mcg/ml  Plan:  1) Vancomycin 2500mg   IV loading dose total (1g given in ER then give another 1500mg  x 1) then 2) Start vancomycin 1250mg  IV q12 thereafter 3) Levaquin 750mg  IV q24 for CrCl > 50 ml/min   Adrian Saran, PharmD, BCPS Pager [phone] 04/14/2015 4:36 PM

## 2015-04-15 ENCOUNTER — Inpatient Hospital Stay (HOSPITAL_COMMUNITY): Payer: Medicare Other

## 2015-04-15 DIAGNOSIS — M25512 Pain in left shoulder: Secondary | ICD-10-CM

## 2015-04-15 DIAGNOSIS — L8990 Pressure ulcer of unspecified site, unspecified stage: Secondary | ICD-10-CM | POA: Insufficient documentation

## 2015-04-15 LAB — GLUCOSE, CAPILLARY
GLUCOSE-CAPILLARY: 129 mg/dL — AB (ref 65–99)
GLUCOSE-CAPILLARY: 180 mg/dL — AB (ref 65–99)
GLUCOSE-CAPILLARY: 146 mg/dL — AB (ref 65–99)
Glucose-Capillary: 220 mg/dL — ABNORMAL HIGH (ref 65–99)

## 2015-04-15 LAB — BASIC METABOLIC PANEL
Anion gap: 6 (ref 5–15)
BUN: 13 mg/dL (ref 6–20)
CALCIUM: 8.3 mg/dL — AB (ref 8.9–10.3)
CHLORIDE: 101 mmol/L (ref 101–111)
CO2: 28 mmol/L (ref 22–32)
CREATININE: 0.7 mg/dL (ref 0.61–1.24)
GFR calc Af Amer: >60 mL/min (ref >60)
GFR calc non Af Amer: >60 mL/min (ref >60)
Glucose, Bld: 179 mg/dL — ABNORMAL HIGH (ref 65–99)
Potassium: 4.6 mmol/L (ref 3.5–5.1)
SODIUM: 135 mmol/L (ref 135–145)

## 2015-04-15 LAB — CBC
HCT: 39.6 % (ref 39.0–52.0)
Hemoglobin: 13 g/dL (ref 13.0–17.0)
MCH: 29.5 pg (ref 26.0–34.0)
MCHC: 32.8 g/dL (ref 30.0–36.0)
MCV: 89.8 fL (ref 78.0–100.0)
PLATELETS: 264 10*3/uL (ref 150–400)
RBC: 4.41 MIL/uL (ref 4.22–5.81)
RDW: 13.2 % (ref 11.5–15.5)
WBC: 9.7 10*3/uL (ref 4.0–10.5)

## 2015-04-15 LAB — HEMOGLOBIN A1C
HEMOGLOBIN A1C: 9.6 % — AB (ref 4.8–5.6)
Mean Plasma Glucose: 229 mg/dL

## 2015-04-15 LAB — MRSA PCR SCREENING: MRSA by PCR: POSITIVE — AB

## 2015-04-15 MED ORDER — MUPIROCIN 2 % EX OINT
1.0000 "application " | TOPICAL_OINTMENT | Freq: Two times a day (BID) | CUTANEOUS | Status: DC
  Administered 2015-04-15 – 2015-04-18 (×8): 1 via NASAL
  Filled 2015-04-15: qty 22

## 2015-04-15 MED ORDER — FUROSEMIDE 40 MG PO TABS
40.0000 mg | ORAL_TABLET | Freq: Every day | ORAL | Status: DC
  Administered 2015-04-15 – 2015-04-18 (×4): 40 mg via ORAL
  Filled 2015-04-15 (×4): qty 1

## 2015-04-15 MED ORDER — CHLORHEXIDINE GLUCONATE CLOTH 2 % EX PADS
6.0000 | MEDICATED_PAD | Freq: Every day | CUTANEOUS | Status: DC
  Administered 2015-04-15 – 2015-04-18 (×3): 6 via TOPICAL

## 2015-04-15 NOTE — Clinical Social Work Note (Signed)
Clinical Social Work Assessment  Patient Details  Name: [name] MRN: [id number] Date of Birth: 04-28-1946  Date of referral:  04/15/15               Reason for consult:  Facility Placement                Permission sought to share information with:  Chartered certified accountant granted to share information::  Yes, Verbal Permission Granted  Name::        Agency::     Relationship::     Contact Information:     Housing/Transportation Living arrangements for the past 2 months:  Single Family Home Source of Information:  Patient Patient Interpreter Needed:  None Criminal Activity/Legal Involvement Pertinent to Current Situation/Hospitalization:  No - Comment as needed Significant Relationships:  Friend Lives with:  Self Do you feel safe going back to the place where you live?  Yes Need for family participation in patient care:  No (Coment)  Care giving concerns:  [name] received consult for SNF placement, reviewed PT evaluation recommending SNF as well.    Social Worker assessment / plan:  [name] spoke with patient who is not completely agreeable with plan for SNF, would like to see how he does with PT in the next day or two and make a decision re: discharge.   Employment status:  Retired Forensic scientist:  [employer] PT Recommendations:  [place] / Referral to community resources:  [place]  Patient/Family's Response to care:  Patient informed [name] that he had been to [facility] in the past, he had been to a few other facilities as well but states that if he had to go to SNF that he would prefer [facility]. [name] made Bryson Ha at [place] aware.   Patient/Family's Understanding of and Emotional Response to Diagnosis, Current Treatment, and Prognosis:  Patient states that he is concerned about his shoulder, that he was not able to ambulate well with PT.   Emotional Assessment Appearance:   Appears stated age Attitude/Demeanor/Rapport:    Affect (typically observed):  Guarded, Pleasant Orientation:  Oriented to Self, Oriented to Place, Oriented to  Time, Oriented to Situation Alcohol / Substance use:    Psych involvement (Current and /or in the community):     Discharge Needs  Concerns to be addressed:    Readmission within the last 30 days:    Current discharge risk:    Barriers to Discharge:      Standley Brooking, LCSW 04/15/2015, 4:13 PM

## 2015-04-15 NOTE — Progress Notes (Signed)
[facility] PROGRESS NOTE  [name] [id number] DOB: Sep 07, 1946 DOA: 04/14/2015 PCP: Irven Shelling, MD   brief narrative 69 y.o. male, morbidly obese male with multiple medical problems including diabetes, hypertension, sleep apnea, peripheral neuropathy from diabetes, chronic indwelling Foley catheter from diabetes-related neurogenic bladder, dyslipidemia, GERD, CAD and prior stroke,presented with   altered mental status, purulent discharge from Foley catheter.  Foley  changed in ED. patient was afebrile, but workup was significant for leukocytosis, and positive urinalysis.  Patient started  empirically on IV vancomycin and levofloxacin in ED given history urine culture in the past growing MRSA and Citrobacter, CT head with no acute finding. Patient was back to his baseline when evaluated by admitting MD.  Patient admitted to telemetry.   Assessment/Plan: Acute encephalopathy Possibly related to UTI. Head CT unremarkable however patient does report that he may have passed out. Follow urine culture. On empiric vancomycin and Levaquin. Will narrow down antibiotic based on culture and sensitivity.  UTI On chronic Foley due to neurogenic bladder. Foley change in the ED. Prior cultures growing MRSA and Citrobacter. Follow cultures and continue empiric antibiotics for now.  Left shoulder pain Reports having acute left shoulder pain for possible fall at home yesterday. X-ray unremarkable. Will continue PT. Vicodin when necessary for pain.  History of recurrent syncope Multiple episodes in the past. Syncopized at home. Possibly vasovagal. Echo done earlier this year showing grade 1 diastolic dysfunction. Continue to monitor on telemetry. PT eval.  Uncontrolled type 2 diabetes mellitus Humulin dose reduced upon admission given history of hypoglycemia 3 weeks back. Check A1c. Monitor on sliding scale insulin.  Obstructive sleep apnea Continue CPAP  Essential  Hypertension Continue home blood pressure medications.  Coronary artery disease Continue aspirin, metoprolol and lisinopril. Stable  Chronic diastolic CHF Appears compensated. Resume Lasix  DVT prophylaxis: Subcutaneous Heparin  Diet: Heart healthy/diabetic   Code Status: Full code Family Communication: None at bedside Disposition Plan: Home once clinically improved. Likely in the next 2-3 days.   Consultants:  None  Procedures:  None  Antibiotics:  IV vanco and levaquin 8/4--  HPI/Subjective: Patient seen and examined. He reports left shoulder pain. Denies any other symptoms.  Objective: Filed Vitals:   04/15/15 0503  BP: 126/64  Pulse: 77  Temp: 98 F (36.7 C)  Resp: 18    Intake/Output Summary (Last 24 hours) at 04/15/15 1153 Last data filed at 04/15/15 0829  Gross per 24 hour  Intake   3161 ml  Output   2250 ml  Net    911 ml   Filed Weights   04/14/15 1157  Weight: 158.759 kg (350 lb)    Exam:   General:  Male lying in bed appears fatigued, not in distress  HEENT: Moist oral mucosa, supple neck  Chest: Clear to auscultation bilaterally, no added sounds  CVS: Normal S1 and S2, no murmurs  GI: Soft, nondistended, nontender, Foley in place  Musculoskeletal: Warm, trace edema, lt shoulder pain with limited ROM  CNS: Alert and oriented   Data Reviewed: Basic Metabolic Panel:  Recent Labs Lab 04/14/15 1227 04/15/15 0455  NA 130* 135  K 4.6 4.6  CL 96* 101  CO2 26 28  GLUCOSE 358* 179*  BUN 15 13  CREATININE 0.82 0.70  CALCIUM 8.8* 8.3*   Liver Function Tests:  Recent Labs Lab 04/14/15 1227  AST 15  ALT 11*  ALKPHOS 102  BILITOT 0.8  PROT 7.6  ALBUMIN 3.5   No results for input(s):  LIPASE, AMYLASE in the last 168 hours. No results for input(s): AMMONIA in the last 168 hours. CBC:  Recent Labs Lab 04/14/15 1227 04/15/15 0455  WBC 13.1* 9.7  NEUTROABS 9.9*  --   HGB 14.5 13.0  HCT 42.2 39.6  MCV 87.4 89.8   PLT 285 264   Cardiac Enzymes: No results for input(s): CKTOTAL, CKMB, CKMBINDEX, TROPONINI in the last 168 hours. BNP (last 3 results)  Recent Labs  09/14/14 0644 12/22/14 2330  BNP 95.8 120.1*    ProBNP (last 3 results)  Recent Labs  08/27/14 1548  PROBNP 1121.0*    CBG:  Recent Labs Lab 04/14/15 1637 04/14/15 2018 04/15/15 0718  GLUCAP 301* 208* 129*    Recent Results (from the past 240 hour(s))  MRSA PCR Screening     Status: Abnormal   Collection Time: 04/14/15  8:25 PM  Result Value Ref Range Status   MRSA by PCR POSITIVE (A) NEGATIVE Final    Comment:        The GeneXpert MRSA Assay (FDA approved for NASAL specimens only), is one component of a comprehensive MRSA colonization surveillance program. It is not intended to diagnose MRSA infection nor to guide or monitor treatment for MRSA infections. RESULT CALLED TO, READ BACK BY AND VERIFIED WITH: E.MADRIAGA,RN AT 0045 04/15/15 BY L.PITT Performed at [facility]      Studies: Ct Head Wo Contrast  04/14/2015   CLINICAL DATA:  Altered mental status.  Urinary tract infection.  EXAM: CT HEAD WITHOUT CONTRAST  TECHNIQUE: Contiguous axial images were obtained from the base of the skull through the vertex without intravenous contrast.  COMPARISON:  Head MRI and CT 11/15/2014  FINDINGS: Symmetric mineralization is again seen in the basal ganglia and thalami bilaterally. Ventricles and sulci are within normal limits for age. Chronic lacunar infarct is again seen in the left caudate. There is no evidence of acute cortical infarct, intracranial hemorrhage, mass, midline shift, or extra-axial fluid collection.  Orbits are unremarkable. The visualized paranasal sinuses and mastoid air cells are clear.  IMPRESSION: No evidence of acute intracranial abnormality.   Electronically Signed   By: Logan Bores   On: 04/14/2015 14:59   Dg Shoulder Left  04/15/2015   CLINICAL DATA:  Acute left shoulder pain concentrated  at lateral humeral head. No known injury.  EXAM: LEFT SHOULDER - 2+ VIEW  COMPARISON:  None.  FINDINGS: Mild degenerative changes in the left [facility] joint with joint space narrowing and spurring. No acute bony abnormality. Specifically, no fracture, subluxation, or dislocation. Soft tissues are intact.  IMPRESSION: No acute bony abnormality.   Electronically Signed   By: Rolm Baptise M.D.   On: 04/15/2015 11:51    Scheduled Meds: . aspirin EC  81 mg Oral q morning - 10a  . Chlorhexidine Gluconate Cloth  6 each Topical Q0600  . heparin  5,000 Units Subcutaneous 3 times per day  . insulin aspart  0-15 Units Subcutaneous TID WC  . insulin NPH Human  20 Units Subcutaneous BID AC & HS  . levofloxacin (LEVAQUIN) IV  750 mg Intravenous Q24H  . lisinopril  5 mg Oral Daily  . metoprolol  50 mg Oral BID  . mupirocin ointment  1 application Nasal BID  . pantoprazole  40 mg Oral Daily  . sodium chloride  3 mL Intravenous Q12H  . vancomycin  1,250 mg Intravenous Q12H   Continuous Infusions: . sodium chloride 10 mL/hr at 04/14/15 1633  Time spent: [place], [place]  [facility] Pager [phone]. If 7PM-7AM, please contact night-coverage at [email or web address], password [facility] 04/15/2015, 11:53 AM  LOS: 1 day

## 2015-04-15 NOTE — Progress Notes (Signed)
Pt refused nocturnal CPAP.  Pt order will be changed to PRN per RT protocol.

## 2015-04-15 NOTE — Progress Notes (Signed)
CRITICAL VALUE ALERT  Critical value received:  MRSA +  Date of notification:  04/15/15  Time of notification:  0050  Critical value read back:Yes.    Nurse who received alert:  Hansel Feinstein RN  MD notified (1st page):  none  Time of first page:  n/a  MD notified (2nd page):  Time of second page:  Responding MD:  n/a  Time MD responded: n/a

## 2015-04-15 NOTE — Evaluation (Signed)
Physical Therapy Evaluation Patient Details Name: [name] MRN: [id number] DOB: 10-Nov-1945 Today's Date: 04/15/2015   History of Present Illness  69 yo male admitted with UTI, AMS, acute L shoulder pain. Hx of frequent synopal episodes, chronic indwelling foley catheter, DM, HTN, neuropathy, neurogenic bladder, obesity, CVA, CHF, CAD. Pt is from home alone  Clinical Impression  Limited eval due to acute L shoulder pain. Mod-max assist +2 for bed mobility. Pt stated he was unable to attempt standing on today due to pain. Attempted to assess L shoulder-pt could only tolerate ~45 degrees shoulder flex AAROM. Pt could not maintain shoulder flex without assistance. He could not tolerate abduction at all. Pt states he does not remember if he injured shoulder during fall at home. Pt could possibly benefit from more imaging of L shoulder to rule out any soft tissue involvement (x-ray (-) for fracture), if MD agrees. At this time, recommend ST rehab at [facility].    Follow Up Recommendations SNF    Equipment Recommendations  None recommended by PT    Recommendations for Other Services OT consult     Precautions / Restrictions Precautions Precautions: Fall Restrictions Weight Bearing Restrictions: No      Mobility  Bed Mobility Overal bed mobility: Needs Assistance Bed Mobility: Supine to Sit;Sit to Supine     Supine to sit: Mod assist;+2 for physical assistance;+2 for safety/equipment Sit to supine: Max assist;+2 for physical assistance;+2 for safety/equipment   General bed mobility comments: Assist for LEs and trunk. Increased time. Pt unable to use L UE due to pain.   Transfers                 General transfer comment: Unable to attempt. Pt politely declined. He did not feel he could hold on to walker with L UE.   Ambulation/Gait             General Gait Details: NT  Stairs            Wheelchair Mobility    Modified Rankin (Stroke Patients Only)        Balance                                             Pertinent Vitals/Pain Pain Assessment: Faces Faces Pain Scale: Hurts worst Pain Location: L shoulder-located at around joint-no radiating per pt Pain Descriptors / Indicators: Tender;Sharp;Spasm;Grimacing;Guarding;Aching Pain Intervention(s): Limited activity within patient's tolerance    Home Living Family/patient expects to be discharged to:: Private residence Living Arrangements: Alone   Type of Home: House Home Access: Stairs to enter   [employer] of Steps: 1 Home Layout: One level Home Equipment: Cane - single point;Grab bars - toilet;Grab bars - tub/shower;Electric scooter;Wheelchair - [employer] - 2 wheels;Walker - standard Additional Comments: Pt takes sponge bath at sink. W/c and scooter do not fit inside the house so pt usually uses cane. Pt reports he is too large for his scooter and no longer feels safe using it in the community Reports he has been educated in use of AE for LB ADL in rehab, but did not purchase. Aide washes his feet and changes socks 1x a week.    Prior Function Level of Independence: Needs assistance;Independent with assistive device(s)   Gait / Transfers Assistance Needed: Pt reports he is able to walk ~25 feet at any one time.  ADL's /  Homemaking Assistance Needed: Pt reports he has been unable to sponge  bathe or take showers. Has been using wipes instead.        Hand Dominance        Extremity/Trunk Assessment   Upper Extremity Assessment: LUE deficits/detail       LUE Deficits / Details: shoulder flex ~45 degrees AAROM with pain. Pt unable to maintain isometric hold of shoulder flex. Attempted abduction-pt unable   Lower Extremity Assessment: Generalized weakness      Cervical / Trunk Assessment: Normal  Communication   Communication: No difficulties  Cognition Arousal/Alertness: Awake/alert Behavior During Therapy: WFL for tasks  assessed/performed Overall Cognitive Status: Within Functional Limits for tasks assessed                      General Comments      Exercises General Exercises - Lower Extremity Long Arc Quad: AROM;Both;Seated Hip Flexion/Marching: AROM;Both;5 reps;Seated      Assessment/Plan    PT Assessment Patient needs continued PT services  PT Diagnosis Difficulty walking;Acute pain;Generalized weakness   PT Problem List Decreased strength;Pain;Decreased range of motion;Decreased activity tolerance;Decreased balance;Decreased mobility;Obesity;Decreased knowledge of use of DME;Impaired sensation  PT Treatment Interventions DME instruction;Gait training;Functional mobility training;Therapeutic activities;Patient/family education;Balance training;Therapeutic exercise   PT Goals (Current goals can be found in the Care Plan section) Acute Rehab PT Goals Patient Stated Goal: less pain! PT Goal Formulation: With patient Time For Goal Achievement: 04/29/15 Potential to Achieve Goals: Fair    Frequency Min 3X/week   Barriers to discharge        Co-evaluation               End of Session   Activity Tolerance: Patient limited by pain Patient left: in bed;with call bell/phone within reach           Time: 1325-1346 PT Time Calculation (min) (ACUTE ONLY): 21 min   Charges:   PT Evaluation $Initial PT Evaluation Tier I: 1 Procedure     PT G Codes:        Weston Anna, MPT Pager: [phone]

## 2015-04-15 NOTE — Clinical Documentation Improvement (Signed)
Please specify diagnosis related to below supporting information, if appropriate.   Possible Clinical Conditions? ________ UTI secondary in chronic indwelling foley catheter, present on admission _______  Other Condition__________________ _______  [name] Clinically Determine   Supporting Information: Per 04/14/15 H&P = UTI  - Patient with chronic indwelling catheter given neurogenic bladder for few years , Foley catheter was changed in ED , most recent urine culture in April 2016 growing MRSA and Citrobacter , follow on urine culture , meanwhile continue with IV vancomycin and levofloxacin, will do escalate antibiotics in 24 hours if patient remains stable .  Thank You, Serena Colonel ,RN Clinical Documentation Specialist:  [facility]

## 2015-04-15 NOTE — Clinical Social Work Placement (Signed)
   CLINICAL SOCIAL WORK PLACEMENT  NOTE  Date:  04/15/2015  Patient Details  Name: [name] MRN: [id number] Date of Birth: 1946/01/15  Clinical Social Work is seeking post-discharge placement for this patient at the [place] level of care (*[name] will initial, date and re-position this form in  chart as items are completed):  Yes   Patient/family provided with [place] Work Department's list of facilities offering this level of care within the geographic area requested by the patient (or if unable, by the patient's family).  Yes   Patient/family informed of their freedom to choose among providers that offer the needed level of care, that participate in Medicare, Medicaid or managed care program needed by the patient, have an available bed and are willing to accept the patient.  Yes   Patient/family informed of [facility]'s ownership interest in [facility] and [facility], as well as of the fact that they are under no obligation to receive care at these facilities.  PASRR submitted to EDS on 04/15/15     PASRR number received on 04/15/15     Existing PASRR number confirmed on       FL2 transmitted to all facilities in geographic area requested by pt/family on 04/15/15     FL2 transmitted to all facilities within larger geographic area on       Patient informed that his/her managed care company has contracts with or will negotiate with certain facilities, including the following:            Patient/family informed of bed offers received.  Patient chooses bed at       Physician recommends and patient chooses bed at      Patient to be transferred to   on  .  Patient to be transferred to facility by       Patient family notified on   of transfer.  Name of family member notified:        PHYSICIAN       Additional Comment:    _______________________________________________ Standley Brooking, LCSW 04/15/2015, 4:16 PM

## 2015-04-15 NOTE — Care Management Note (Signed)
Case Management Note  Patient Details  Name: RUDYARD MCNEMAR MRN: [id number] Date of Birth: 13-Jan-1946  Subjective/Objective: 69 y/o m admitted w/UTI. From home alone. PT cons-await recommendations.                   Action/Plan:d/c plan home.   Expected Discharge Date:   (unknown)               Expected Discharge Plan:  [place]  In-House Referral:     Discharge planning Services  CM Consult  Post Acute Care Choice:    Choice offered to:     DME Arranged:    DME Agency:     HH Arranged:    HH Agency:     Status of Service:  In process, will continue to follow  Medicare Important Message Given:    Date Medicare IM Given:    Medicare IM give by:    Date Additional Medicare IM Given:    Additional Medicare Important Message give by:     If discussed at [place] of Stay Meetings, dates discussed:    Additional Comments:  Dessa Phi, RN 04/15/2015, 11:20 AM

## 2015-04-16 DIAGNOSIS — E118 Type 2 diabetes mellitus with unspecified complications: Secondary | ICD-10-CM

## 2015-04-16 DIAGNOSIS — R55 Syncope and collapse: Secondary | ICD-10-CM

## 2015-04-16 DIAGNOSIS — I5032 Chronic diastolic (congestive) heart failure: Secondary | ICD-10-CM

## 2015-04-16 DIAGNOSIS — N390 Urinary tract infection, site not specified: Secondary | ICD-10-CM

## 2015-04-16 LAB — GLUCOSE, CAPILLARY
Glucose-Capillary: 147 mg/dL — ABNORMAL HIGH (ref 65–99)
Glucose-Capillary: 133 mg/dL — ABNORMAL HIGH (ref 65–99)
Glucose-Capillary: 143 mg/dL — ABNORMAL HIGH (ref 65–99)
Glucose-Capillary: 187 mg/dL — ABNORMAL HIGH (ref 65–99)

## 2015-04-16 LAB — VANCOMYCIN, TROUGH: Vancomycin Tr: 13 ug/mL (ref 10.0–20.0)

## 2015-04-16 LAB — URINE CULTURE

## 2015-04-16 LAB — MAGNESIUM: MAGNESIUM: 1.7 mg/dL (ref 1.7–2.4)

## 2015-04-16 MED ORDER — MAGNESIUM SULFATE IN D5W 10-5 MG/ML-% IV SOLN
1.0000 g | Freq: Once | INTRAVENOUS | Status: AC
  Administered 2015-04-16: 1 g via INTRAVENOUS
  Filled 2015-04-16: qty 100

## 2015-04-16 NOTE — Progress Notes (Signed)
Patient had episode of 6 beats run of Vtach. Asymptomatic. On call made aware, new order received: Magnesium level. Will   Cont to monitor.

## 2015-04-16 NOTE — Progress Notes (Signed)
Patient having several missed beats. Longest pause being 1.95 sec. Patient asymptomatic and VS stable. HR - 58, BP - 123/53 . Dr Coralyn Pear notified  Barbee Shro[place]. Brigitte Pulse, RN

## 2015-04-16 NOTE — Plan of Care (Signed)
Problem: Phase I Progression Outcomes Goal: Voiding-avoid urinary catheter unless indicated Outcome: Not Met (add Reason) Chronic foley catheter use d/t neurogenic bladder

## 2015-04-16 NOTE — Progress Notes (Signed)
Physical Therapy Treatment Patient Details Name: [name] MRN: [id number] DOB: December 09, 1945 Today's Date: 04/16/2015    History of Present Illness 69 yo male admitted with UTI, AMS, acute L shoulder pain. Hx of frequent synopal episodes, chronic indwelling foley catheter, DM, HTN, neuropathy, neurogenic bladder, obesity, CVA, CHF, CAD. Pt is from home alone    PT Comments    Pt with syncopal episode during standing today with pt having to be returned to sitting with MAX of 2.  Once pt alert again, he stated he had no dizziness prior to event.  BP in sitting 171/84 and returned to supine it was 148/64. Patient had pain meds prior to treatment and initially stated L shoulder was hurting and was painful with any attempts at using it.  At end of session, pt stating he had no pain in L shoulder, but pt with limited use of it today, although was able to stand briefly with hands on RW prior to syncopal episode.  Follow Up Recommendations  SNF     Equipment Recommendations  None recommended by PT    Recommendations for Other Services OT consult     Precautions / Restrictions Precautions Precautions: Fall;Other (comment) (hx of recurrent syncope)    Mobility  Bed Mobility Overal bed mobility: Needs Assistance Bed Mobility: Supine to Sit     Supine to sit: Min assist Sit to supine: Mod assist   General bed mobility comments: supine to sit A with pad to get hips turned due to not using L UE. with sit >supine MOD A for LE  Transfers Overall transfer level: Needs assistance Equipment used: Rolling walker (2 wheeled) Transfers: Sit to/from Stand Sit to Stand: Mod assist;+2 physical assistance;From elevated surface         General transfer comment: Pt able to use momentum to initiate sit to stand, but need MOD of 2 halfway thru transfer to complete.  Pt with little use of UE and state "That was all knees." then he got dazed look on his face and decreased level of alertness and PT  and aide sat pt back down to EOB with MAx of 2.  Pt came to while sitting within 2-3 seconds and had no recall of how he got sitting.  "I wasn't even dizzy" BP in sitting 171/84 and onc.e returned supine 148/64  Ambulation/Gait                 Stairs            Wheelchair Mobility    Modified Rankin (Stroke Patients Only)       Balance Overall balance assessment: Needs assistance   Sitting balance-Leahy Scale: Good     Standing balance support: Bilateral upper extremity supported Standing balance-Leahy Scale: Zero Standing balance comment: Pt passed out in standing                    Cognition Arousal/Alertness: Awake/alert Behavior During Therapy: WFL for tasks assessed/performed Overall Cognitive Status: Within Functional Limits for tasks assessed                      Exercises      General Comments        Pertinent Vitals/Pain Pain Assessment: Faces Faces Pain Scale: Hurts little more Pain Location: L shoulder Pain Descriptors / Indicators: Guarding Pain Intervention(s): Premedicated before session;Monitored during session    Home Living  Prior Function            PT Goals (current goals can now be found in the care plan section) Acute Rehab PT Goals Patient Stated Goal: less pain! PT Goal Formulation: With patient Time For Goal Achievement: 04/29/15 Potential to Achieve Goals: Fair Progress towards PT goals: Progressing toward goals    Frequency  Min 3X/week    PT Plan Current plan remains appropriate    Co-evaluation             End of Session Equipment Utilized During Treatment: Gait belt Activity Tolerance: Treatment limited secondary to medical complications (Comment) Patient left: in bed;with call bell/phone within reach;with bed alarm set     Time: [id number] PT Time Calculation (min) (ACUTE ONLY): 21 min  Charges:  $Therapeutic Activity: 8-22 mins                    G  Codes:      Mistey Hoffert LUBECK 04/16/2015, 4:52 PM

## 2015-04-16 NOTE — Progress Notes (Signed)
[facility] PROGRESS NOTE  [name] [id number] DOB: [birth year] DOA: 04/14/2015 PCP: Irven Shelling, MD   brief narrative 69 y.o. male, morbidly obese male with multiple medical problems including diabetes, hypertension, sleep apnea, peripheral neuropathy from diabetes, chronic indwelling Foley catheter from diabetes-related neurogenic bladder, dyslipidemia, GERD, CAD and prior stroke,presented with   altered mental status, purulent discharge from Foley catheter.  Foley  changed in ED. patient was afebrile, but workup was significant for leukocytosis, and positive urinalysis.  Patient started  empirically on IV vancomycin and levofloxacin in ED given history urine culture in the past growing MRSA and Citrobacter, CT Wood with no acute finding. Patient was back to his baseline when evaluated by admitting MD.  Patient admitted to telemetry.   Assessment/Plan: Acute encephalopathy Possibly related to UTI. Wood CT unremarkable however patient does report that he may have passed out. Follow urine culture. On empiric vancomycin and Levaquin. Will narrow down antibiotic based on culture and sensitivity. Overall clinical improvement, he was alert and oriented 3  UTI secondary to chronic indwelling Foley catheter Present on admission On chronic Foley due to neurogenic bladder. Foley change in the ED. Prior cultures growing MRSA and Citrobacter.  -Urine cultures growing multiple species present  Left shoulder pain Reports having acute left shoulder pain for possible fall at home yesterday. -Left shoulder x-ray did not show acute bony abnormality  History of recurrent syncope Multiple episodes in the past. Syncopized at home. Possibly vasovagal. Echo done earlier this year showing grade 1 diastolic dysfunction. Continue to monitor on telemetry. PT eval.  Uncontrolled type 2 diabetes mellitus Humulin dose reduced upon admission given history of hypoglycemia 3 weeks back. Check  A1c. Monitor on sliding scale insulin.  Obstructive sleep apnea Continue CPAP  Essential Hypertension Continue home blood pressure medications.  Coronary artery disease Continue aspirin, metoprolol and lisinopril. Stable  Chronic diastolic CHF Appears compensated. Resume Lasix  DVT prophylaxis: Subcutaneous Heparin  Diet: Heart healthy/diabetic   Code Status: Full code Family Communication: None at bedside Disposition Plan: Home once clinically improved. Likely in the next 2-3 days.   Consultants:  None  Procedures:  None  Antibiotics:  IV vanco and levaquin 8/4--  HPI/Subjective: Patient seen and examined. He reports left shoulder pain. Denies any other symptoms.  Objective: Filed Vitals:   04/16/15 1325  BP: 129/47  Pulse: 68  Temp: 97.6 F (36.4 C)  Resp: 20    Intake/Output Summary (Last 24 hours) at 04/16/15 1349 Last data filed at 04/16/15 1300  Gross per 24 hour  Intake    970 ml  Output   4800 ml  Net  -3830 ml   Filed Weights   04/14/15 1157 04/16/15 0945  Weight: 158.759 kg (350 lb) 160.4 kg (353 lb 9.9 oz)    Exam:   General:  Patient is awake and alert, following commands, nontoxic appearing  HEENT: Moist oral mucosa, supple neck  Chest: Clear to auscultation bilaterally, no added sounds  CVS: Normal S1 and S2, no murmurs  GI: Soft, nondistended, nontender, Foley in place  Musculoskeletal: Warm, trace edema, lt shoulder pain with limited ROM  CNS: Alert and oriented   Data Reviewed: Basic Metabolic Panel:  Recent Labs Lab 04/14/15 1227 04/15/15 0455 04/16/15 0132  NA 130* 135  --   K 4.6 4.6  --   CL 96* 101  --   CO2 26 28  --   GLUCOSE 358* 179*  --   BUN 15 13  --  CREATININE 0.82 0.70  --   CALCIUM 8.8* 8.3*  --   MG  --   --  1.7   Liver Function Tests:  Recent Labs Lab 04/14/15 1227  AST 15  ALT 11*  ALKPHOS 102  BILITOT 0.8  PROT 7.6  ALBUMIN 3.5   No results for input(s): LIPASE, AMYLASE  in the last 168 hours. No results for input(s): AMMONIA in the last 168 hours. CBC:  Recent Labs Lab 04/14/15 1227 04/15/15 0455  WBC 13.1* 9.7  NEUTROABS 9.9*  --   HGB 14.5 13.0  HCT 42.2 39.6  MCV 87.4 89.8  PLT 285 264   Cardiac Enzymes: No results for input(s): CKTOTAL, CKMB, CKMBINDEX, TROPONINI in the last 168 hours. BNP (last 3 results)  Recent Labs  09/14/14 0644 12/22/14 2330  BNP 95.8 120.1*    ProBNP (last 3 results)  Recent Labs  08/27/14 1548  PROBNP 1121.0*    CBG:  Recent Labs Lab 04/15/15 1214 04/15/15 1642 04/15/15 2032 04/16/15 0724 04/16/15 1137  GLUCAP 180* 146* 220* 147* 133*    Recent Results (from the past 240 hour(s))  Blood Culture (routine x 2)     Status: None (Preliminary result)   Collection Time: 04/14/15 12:30 PM  Result Value Ref Range Status   Specimen Description BLOOD RIGHT ANTECUBITAL  Final   Special Requests BOTTLES DRAWN AEROBIC AND ANAEROBIC 5ML  Final   Culture   Final    NO GROWTH 2 DAYS Performed at [facility]    Report Status PENDING  Incomplete  Blood Culture (routine x 2)     Status: None (Preliminary result)   Collection Time: 04/14/15 12:33 PM  Result Value Ref Range Status   Specimen Description BLOOD LEFT ANTECUBITAL  Final   Special Requests BOTTLES DRAWN AEROBIC AND ANAEROBIC 4.5ML  Final   Culture   Final    NO GROWTH 2 DAYS Performed at [facility]    Report Status PENDING  Incomplete  Urine culture     Status: None   Collection Time: 04/14/15  1:03 PM  Result Value Ref Range Status   Specimen Description URINE, RANDOM  Final   Special Requests NONE  Final   Culture   Final    MULTIPLE SPECIES PRESENT, SUGGEST RECOLLECTION Performed at [facility]    Report Status 04/16/2015 FINAL  Final  MRSA PCR Screening     Status: Abnormal   Collection Time: 04/14/15  8:25 PM  Result Value Ref Range Status   MRSA by PCR POSITIVE (A) NEGATIVE Final    Comment:         The GeneXpert MRSA Assay (FDA approved for NASAL specimens only), is one component of a comprehensive MRSA colonization surveillance program. It is not intended to diagnose MRSA infection nor to guide or monitor treatment for MRSA infections. RESULT CALLED TO, READ BACK BY AND VERIFIED WITH: E.MADRIAGA,RN AT 0045 04/15/15 BY L.PITT Performed at [facility]      Studies: Ct Wood Wo Contrast  04/14/2015   CLINICAL DATA:  Altered mental status.  Urinary tract infection.  EXAM: CT Wood WITHOUT CONTRAST  TECHNIQUE: Contiguous axial images were obtained from the base of the skull through the vertex without intravenous contrast.  COMPARISON:  Wood MRI and CT 11/15/2014  FINDINGS: Symmetric mineralization is again seen in the basal ganglia and thalami bilaterally. Ventricles and sulci are within normal limits for age. Chronic lacunar infarct is again seen in the left caudate.  There is no evidence of acute cortical infarct, intracranial hemorrhage, mass, midline shift, or extra-axial fluid collection.  Orbits are unremarkable. The visualized paranasal sinuses and mastoid air cells are clear.  IMPRESSION: No evidence of acute intracranial abnormality.   Electronically Signed   By: Logan Bores   On: 04/14/2015 14:59   Dg Shoulder Left  04/15/2015   CLINICAL DATA:  Acute left shoulder pain concentrated at lateral humeral Wood. No known injury.  EXAM: LEFT SHOULDER - 2+ VIEW  COMPARISON:  None.  FINDINGS: Mild degenerative changes in the left [facility] joint with joint space narrowing and spurring. No acute bony abnormality. Specifically, no fracture, subluxation, or dislocation. Soft tissues are intact.  IMPRESSION: No acute bony abnormality.   Electronically Signed   By: Rolm Baptise M.D.   On: 04/15/2015 11:51    Scheduled Meds: . aspirin EC  81 mg Oral q morning - 10a  . Chlorhexidine Gluconate Cloth  6 each Topical Q0600  . furosemide  40 mg Oral Daily  . heparin  5,000 Units Subcutaneous 3  times per day  . insulin aspart  0-15 Units Subcutaneous TID WC  . insulin NPH Human  20 Units Subcutaneous BID AC & HS  . levofloxacin (LEVAQUIN) IV  750 mg Intravenous Q24H  . lisinopril  5 mg Oral Daily  . metoprolol  50 mg Oral BID  . mupirocin ointment  1 application Nasal BID  . pantoprazole  40 mg Oral Daily  . sodium chloride  3 mL Intravenous Q12H  . vancomycin  1,250 mg Intravenous Q12H   Continuous Infusions: . sodium chloride 10 mL/hr at 04/14/15 1633      Time spent: [place], [place] [facility] Pager [phone]. If 7PM-7AM, please contact night-coverage at [email or web address], password [facility] 04/16/2015, 1:49 PM  LOS: 2 days

## 2015-04-16 NOTE — Progress Notes (Signed)
ANTIBIOTIC CONSULT NOTE - Follow Up  Pharmacy Consult for vancomycin, Levaquin Indication: UTI - hx MRSA and citrobacter  Allergies  Allergen Reactions  . Ace Inhibitors Swelling    Pt tolerates lisinopril  . Lipitor [Atorvastatin Calcium] Swelling  . Metformin And Related Swelling  . Cefadroxil Hives  . Cephalexin Hives  . Morphine And Related Other (See Comments)    Sweating, feels like is "in rocky boat."  . Robaxin [Methocarbamol] Other (See Comments)    Feels like he is shaky    Patient Measurements: Height: 6\' 1"  (185.4 cm) Weight: (!) 353 lb 9.9 oz (160.4 kg) IBW/kg (Calculated) : 79.9  Vital Signs: Temp: 97.6 F (36.4 C) (08/06 1325) Temp Source: Oral (08/06 1325) BP: 129/47 mmHg (08/06 1325) Pulse Rate: 68 (08/06 1325) Intake/Output from previous day: 08/05 0701 - 08/06 0700 In: 730 [P.O.:480; IV Piggyback:250] Out: 3100 [Urine:3100] Intake/Output from this shift: Total I/O In: 480 [P.O.:480] Out: 1700 [Urine:1700]  Labs:  Recent Labs  04/14/15 1227 04/15/15 0455  WBC 13.1* 9.7  HGB 14.5 13.0  PLT 285 264  CREATININE 0.82 0.70   Estimated Creatinine Clearance: 142.1 mL/min (by C-G formula based on Cr of 0.7). No results for input(s): VANCOTROUGH, VANCOPEAK, VANCORANDOM, GENTTROUGH, GENTPEAK, GENTRANDOM, TOBRATROUGH, TOBRAPEAK, TOBRARND, AMIKACINPEAK, AMIKACINTROU, AMIKACIN in the last 72 hours.   Microbiology: Recent Results (from the past 720 hour(s))  Blood Culture (routine x 2)     Status: None (Preliminary result)   Collection Time: 04/14/15 12:30 PM  Result Value Ref Range Status   Specimen Description BLOOD RIGHT ANTECUBITAL  Final   Special Requests BOTTLES DRAWN AEROBIC AND ANAEROBIC 5ML  Final   Culture   Final    NO GROWTH 2 DAYS Performed at [facility]    Report Status PENDING  Incomplete  Blood Culture (routine x 2)     Status: None (Preliminary result)   Collection Time: 04/14/15 12:33 PM  Result Value Ref Range Status    Specimen Description BLOOD LEFT ANTECUBITAL  Final   Special Requests BOTTLES DRAWN AEROBIC AND ANAEROBIC 4.5ML  Final   Culture   Final    NO GROWTH 2 DAYS Performed at [facility]    Report Status PENDING  Incomplete  Urine culture     Status: None   Collection Time: 04/14/15  1:03 PM  Result Value Ref Range Status   Specimen Description URINE, RANDOM  Final   Special Requests NONE  Final   Culture   Final    MULTIPLE SPECIES PRESENT, SUGGEST RECOLLECTION Performed at [facility]    Report Status 04/16/2015 FINAL  Final  MRSA PCR Screening     Status: Abnormal   Collection Time: 04/14/15  8:25 PM  Result Value Ref Range Status   MRSA by PCR POSITIVE (A) NEGATIVE Final    Comment:        The GeneXpert MRSA Assay (FDA approved for NASAL specimens only), is one component of a comprehensive MRSA colonization surveillance program. It is not intended to diagnose MRSA infection nor to guide or monitor treatment for MRSA infections. RESULT CALLED TO, READ BACK BY AND VERIFIED WITH: E.MADRIAGA,RN AT 0045 04/15/15 BY L.PITT Performed at [facility]     Assessment: 69 yo presents to ER with AMS likely 2/2 UTI in patient that has an indwelling catheter d/t neurogenic bladder for few years to start vancomycin and Levaquin for +UA pending cultures per Md orders. Note patient with urine culture from 12/23/14 that  grew MRSA and citrobacter which was not treated at the time since patient was asymptomatic with urinary catheter present (CA-ASB). PMH also includes obesity, DM, HTN, HLP, CAD, CHF, DVT, PNA, OSA, and GERD.   8/4 >> Vancomycin >>  8/4 >> Levaquin >>   Micro data: 12/23/14 urine: MRSA and citrobacter  8/4 MRSA PCR (+)-- on bactroban, chlx  8/4 ucx: multiple species present, suggest recollection  8/4 bcx x2: ngtd  Today is day #3 Vanc 2500mg  LD then 1250mg  q12h and Levaquin 750mg  IV q24h.  WBC improved to WNL, SCr 0.70 for CrCl~100 ml/min  (normalized), afebrile.  Goal of Therapy:  Vancomycin trough level 10-15 mcg/ml - goal reduced for treatment of UTI, blood cultures are ngtd x 2 days Doses adjusted per renal function Eradication of infection  Plan:  - Check vancomycin trough this evening and adjust dose per level. - Continue Levaquin 750 mg IV q24h.  Hershal Coria, PharmD, BCPS Pager: [phone] 04/16/2015 2:09 PM

## 2015-04-16 NOTE — Progress Notes (Signed)
ANTIBIOTIC CONSULT NOTE - Follow Up  Pharmacy Consult for vancomycin, Levaquin Indication: UTI - hx MRSA and citrobacter  Allergies  Allergen Reactions  . Ace Inhibitors Swelling    Pt tolerates lisinopril  . Lipitor [Atorvastatin Calcium] Swelling  . Metformin And Related Swelling  . Cefadroxil Hives  . Cephalexin Hives  . Morphine And Related Other (See Comments)    Sweating, feels like is "in rocky boat."  . Robaxin [Methocarbamol] Other (See Comments)    Feels like he is shaky    Patient Measurements: Height: 6\' 1"  (185.4 cm) Weight: (!) 353 lb 9.9 oz (160.4 kg) IBW/kg (Calculated) : 79.9  Vital Signs: Temp: 97.6 F (36.4 C) (08/06 1325) Temp Source: Oral (08/06 1325) BP: 148/64 mmHg (08/06 1625) Pulse Rate: 68 (08/06 1325) Intake/Output from previous day: 08/05 0701 - 08/06 0700 In: 730 [P.O.:480; IV Piggyback:250] Out: 3100 [Urine:3100] Intake/Output from this shift:    Labs:  Recent Labs  04/14/15 1227 04/15/15 0455  WBC 13.1* 9.7  HGB 14.5 13.0  PLT 285 264  CREATININE 0.82 0.Brian   Estimated Creatinine Clearance: 142.1 mL/min (by C-G formula based on Cr of 0.7).  Recent Labs  04/16/15 1819  [place] 13     Microbiology: Recent Results (from the past 720 hour(s))  Blood Culture (routine x 2)     Status: None (Preliminary result)   Collection Time: 04/14/15 12:30 PM  Result Value Ref Range Status   Specimen Description BLOOD RIGHT ANTECUBITAL  Final   Special Requests BOTTLES DRAWN AEROBIC AND ANAEROBIC 5ML  Final   Culture   Final    NO GROWTH 2 DAYS Performed at [facility]    Report Status PENDING  Incomplete  Blood Culture (routine x 2)     Status: None (Preliminary result)   Collection Time: 04/14/15 12:33 PM  Result Value Ref Range Status   Specimen Description BLOOD LEFT ANTECUBITAL  Final   Special Requests BOTTLES DRAWN AEROBIC AND ANAEROBIC 4.5ML  Final   Culture   Final    NO GROWTH 2 DAYS Performed at [facility]    Report Status PENDING  Incomplete  Urine culture     Status: None   Collection Time: 04/14/15  1:03 PM  Result Value Ref Range Status   Specimen Description URINE, RANDOM  Final   Special Requests NONE  Final   Culture   Final    MULTIPLE SPECIES PRESENT, SUGGEST RECOLLECTION Performed at [facility]    Report Status 04/16/2015 FINAL  Final  MRSA PCR Screening     Status: Abnormal   Collection Time: 04/14/15  8:25 PM  Result Value Ref Range Status   MRSA by PCR POSITIVE (A) NEGATIVE Final    Comment:        The GeneXpert MRSA Assay (FDA approved for NASAL specimens only), is one component of a comprehensive MRSA colonization surveillance program. It is not intended to diagnose MRSA infection nor to guide or monitor treatment for MRSA infections. RESULT CALLED TO, READ BACK BY AND VERIFIED WITH: E.MADRIAGA,RN AT 0045 04/15/15 BY L.PITT Performed at [facility]     Assessment: 69 Wood presents to ER with AMS likely 2/2 UTI in patient that has an indwelling catheter d/t neurogenic bladder for few years to start vancomycin and Levaquin for +UA pending cultures per Md orders. Note patient with urine culture from 12/23/14 that grew MRSA and citrobacter which was not treated at the time since patient was asymptomatic with  urinary catheter present (CA-ASB). PMH also includes obesity, DM, HTN, HLP, CAD, CHF, DVT, PNA, OSA, and GERD.   8/4 >> Vancomycin >>  8/4 >> Levaquin >>   Micro data: 12/23/14 urine: MRSA and citrobacter  8/4 MRSA PCR (+)-- on bactroban, chlx  8/4 ucx: multiple species present, suggest recollection  8/4 bcx x2: ngtd  Today is day #3 Vanc 2500mg  LD then 1250mg  q12h and Levaquin 750mg  IV q24h.  WBC improved to WNL, SCr 0.Brian for CrCl~100 ml/min (normalized), afebrile.  Dose changes/levels: 8/6 1700 VT: 16mcg/ml  on 1250 mg q12h (prior to 5th dose)  Goal of Therapy:  Vancomycin trough level 10-15 mcg/ml - goal reduced for treatment of  UTI, blood cultures are ngtd x 2 days Doses adjusted per renal function Eradication of infection  Plan:  - Continue vancomycin  1250mg  IV q12h as ordered as trough is appropriate for indication.  Consider rechecking in 3-4 days if remains on vancomycin to rule out accumulation - Continue Levaquin 750 mg IV q24h.  Doreene Eland, PharmD, BCPS.   Pager: [phone] 04/16/2015 7:08 PM

## 2015-04-17 DIAGNOSIS — G9340 Encephalopathy, unspecified: Secondary | ICD-10-CM

## 2015-04-17 LAB — GLUCOSE, CAPILLARY
GLUCOSE-CAPILLARY: 180 mg/dL — AB (ref 65–99)
GLUCOSE-CAPILLARY: 142 mg/dL — AB (ref 65–99)
Glucose-Capillary: 151 mg/dL — ABNORMAL HIGH (ref 65–99)
Glucose-Capillary: 173 mg/dL — ABNORMAL HIGH (ref 65–99)

## 2015-04-17 LAB — TROPONIN I
Troponin I: <0.03 ng/mL (ref <0.031)
Troponin I: <0.03 ng/mL (ref <0.031)

## 2015-04-17 LAB — MAGNESIUM: Magnesium: 1.8 mg/dL (ref 1.7–2.4)

## 2015-04-17 MED ORDER — NITROFURANTOIN MONOHYD MACRO 100 MG PO CAPS
100.0000 mg | ORAL_CAPSULE | Freq: Two times a day (BID) | ORAL | Status: DC
  Administered 2015-04-17 – 2015-04-18 (×3): 100 mg via ORAL
  Filled 2015-04-17 (×3): qty 1

## 2015-04-17 NOTE — Progress Notes (Signed)
[facility] hospitalist progress note. Chief complaint. Chest pressure. History of present illness. This 69 year old male hospitalized with acute encephalopathy, UTI, shoulder pain, etc. Patient was noted to have a 12 beat run of V. tach per central telemetry. Nursing checked on the patient to reported some chest pressure during that event. 12-lead EKG was obtained and this does not appear significantly different than prior EKG and does not look acutely ischemic. I came up to see the patient at bedside and he reports that being chest pain/pressure free at this time. Physical exam. Vital signs. Temperature 98.3, pulse 78, respiration 20, blood pressure 146/63. O2 sats 99%. General appearance. Obese male who is alert and in no distress. Cardiac. Rate and rhythm regular. Lungs. Breath sounds reduced but clear. Abdomen. Soft and obese with positive bowel sounds. Impression/plan. Problem #1. V. tach. Patient with reported 12 beat run of V. tach per central telemetry. I will check the patient's serum magnesium level to ensure that this is above 2.0. Problem #2. Chest pressure. Likely secondary to #1. Suggestive of possible demand ischemia. We'll obtain troponin now and then every 6 hours for a total of 3 sets. We'll repeat an EKG in approximately 6 hours to evaluate for any changes.

## 2015-04-17 NOTE — Progress Notes (Signed)
[facility] PROGRESS NOTE  [name] [id number] DOB: 17-Aug-1946 DOA: 04/14/2015 PCP: Irven Shelling, MD   brief narrative 69 y.o. male, morbidly obese male with multiple medical problems including diabetes, hypertension, sleep apnea, peripheral neuropathy from diabetes, chronic indwelling Foley catheter from diabetes-related neurogenic bladder, dyslipidemia, GERD, CAD and prior stroke,presented with   altered mental status, purulent discharge from Foley catheter.  Foley  changed in ED. patient was afebrile, but workup was significant for leukocytosis, and positive urinalysis.  Patient started  empirically on IV vancomycin and levofloxacin in ED given history urine culture in the past growing MRSA and Citrobacter, CT head with no acute finding. Patient was back to his baseline when evaluated by admitting MD.  Patient admitted to telemetry.   Assessment/Plan: Acute encephalopathy Possibly related to UTI. Head CT unremarkable however patient does report that he may have passed out. Follow urine culture. On empiric vancomycin and Levaquin. Will narrow down antibiotic based on culture and sensitivity. Overall clinical improvement, he was alert and oriented 3   UTI secondary to chronic indwelling Foley catheter Present on admission On chronic Foley due to neurogenic bladder. Foley change in the ED. Prior cultures growing MRSA and Citrobacter.  -Urine cultures growing multiple species present Urine cultures reporting multiple species present. Previous cultures from April 2016 grew methicillin-resistant -Staphylococcus aureus and Citrobacter, in looking at the susceptibilities of both organisms is appears both were sensitive to nitrofurantoin -Plan to discontinue IV vancomycin and Levaquin, start nitrofurantoin at 100 mg by mouth twice a day  Left shoulder pain Reports having acute left shoulder pain for possible fall at home yesterday. -Left shoulder x-ray did not show acute  bony abnormality  History of recurrent syncope Multiple episodes in the past. Syncopized at home. Possibly vasovagal. Echo done earlier this year showing grade 1 diastolic dysfunction. Continue to monitor on telemetry.  Uncontrolled type 2 diabetes mellitus Humulin dose reduced upon admission given history of hypoglycemia 3 weeks back. Check A1c. Monitor on sliding scale insulin.  Obstructive sleep apnea Continue CPAP  Essential Hypertension Continue home blood pressure medications.  Coronary artery disease Continue aspirin, metoprolol and lisinopril. Stable  Chronic diastolic CHF This compensated, will continue Lasix 40 mg by mouth daily.  12 beat run of V. Tach. Overnight telemetry reporting all be run of V. tach, was assessed at bedside. EKG did not show acute ischemic changes. Has had 2 negative troponins thus far. Magnesium level at 1.8.   DVT prophylaxis: Subcutaneous Heparin  Diet: Heart healthy/diabetic   Code Status: Full code Family Communication: None at bedside Disposition Plan: Will likely require skilled nursing facility placement   Consultants:  None  Procedures:  None  Antibiotics:  IV vanco and levaquin 8/4--stopped on 04/17/2015  Nitrofurantoin started on 04/17/2015  HPI/Subjective: Patient was assisted out of bed to chair today. He was significantly deconditioned.  Objective: Filed Vitals:   04/17/15 0903  BP: 115/65  Pulse:   Temp:   Resp:     Intake/Output Summary (Last 24 hours) at 04/17/15 1143 Last data filed at 04/17/15 0734  Gross per 24 hour  Intake 1757.33 ml  Output   3800 ml  Net -2042.67 ml   Filed Weights   04/14/15 1157 04/16/15 0945  Weight: 158.759 kg (350 lb) 160.4 kg (353 lb 9.9 oz)    Exam:   General:  Patient is awake and alert, following commands, nontoxic appearing, he was assisted out of bed to chair  HEENT: Moist oral mucosa, supple neck  Chest:  Clear to auscultation bilaterally, normal  respiratory effort  CVS: Normal S1 and S2, no murmurs  GI: Soft, nondistended, nontender, Foley in place  Musculoskeletal: Warm, bilateral lower extremity edema, venous stasis changes, lt shoulder pain with limited ROM  CNS: Alert and oriented   Data Reviewed: Basic Metabolic Panel:  Recent Labs Lab 04/14/15 1227 04/15/15 0455 04/16/15 0132 04/17/15 0150  NA 130* 135  --   --   K 4.6 4.6  --   --   CL 96* 101  --   --   CO2 26 28  --   --   GLUCOSE 358* 179*  --   --   BUN 15 13  --   --   CREATININE 0.82 0.70  --   --   CALCIUM 8.8* 8.3*  --   --   MG  --   --  1.7 1.8   Liver Function Tests:  Recent Labs Lab 04/14/15 1227  AST 15  ALT 11*  ALKPHOS 102  BILITOT 0.8  PROT 7.6  ALBUMIN 3.5   No results for input(s): LIPASE, AMYLASE in the last 168 hours. No results for input(s): AMMONIA in the last 168 hours. CBC:  Recent Labs Lab 04/14/15 1227 04/15/15 0455  WBC 13.1* 9.7  NEUTROABS 9.9*  --   HGB 14.5 13.0  HCT 42.2 39.6  MCV 87.4 89.8  PLT 285 264   Cardiac Enzymes:  Recent Labs Lab 04/17/15 0150 04/17/15 0709  TROPONINI <0.03 <0.03   BNP (last 3 results)  Recent Labs  09/14/14 0644 12/22/14 2330  BNP 95.8 120.1*    ProBNP (last 3 results)  Recent Labs  08/27/14 1548  PROBNP 1121.0*    CBG:  Recent Labs Lab 04/16/15 0724 04/16/15 1137 04/16/15 1650 04/16/15 2202 04/17/15 0737  GLUCAP 147* 133* 143* 187* 151*    Recent Results (from the past 240 hour(s))  Blood Culture (routine x 2)     Status: None (Preliminary result)   Collection Time: 04/14/15 12:30 PM  Result Value Ref Range Status   Specimen Description BLOOD RIGHT ANTECUBITAL  Final   Special Requests BOTTLES DRAWN AEROBIC AND ANAEROBIC 5ML  Final   Culture   Final    NO GROWTH 2 DAYS Performed at [facility]    Report Status PENDING  Incomplete  Blood Culture (routine x 2)     Status: None (Preliminary result)   Collection Time: 04/14/15 12:33  PM  Result Value Ref Range Status   Specimen Description BLOOD LEFT ANTECUBITAL  Final   Special Requests BOTTLES DRAWN AEROBIC AND ANAEROBIC 4.5ML  Final   Culture   Final    NO GROWTH 2 DAYS Performed at [facility]    Report Status PENDING  Incomplete  Urine culture     Status: None   Collection Time: 04/14/15  1:03 PM  Result Value Ref Range Status   Specimen Description URINE, RANDOM  Final   Special Requests NONE  Final   Culture   Final    MULTIPLE SPECIES PRESENT, SUGGEST RECOLLECTION Performed at [facility]    Report Status 04/16/2015 FINAL  Final  MRSA PCR Screening     Status: Abnormal   Collection Time: 04/14/15  8:25 PM  Result Value Ref Range Status   MRSA by PCR POSITIVE (A) NEGATIVE Final    Comment:        The GeneXpert MRSA Assay (FDA approved for NASAL specimens only), is one component of a  comprehensive MRSA colonization surveillance program. It is not intended to diagnose MRSA infection nor to guide or monitor treatment for MRSA infections. RESULT CALLED TO, READ BACK BY AND VERIFIED WITH: E.MADRIAGA,RN AT 0045 04/15/15 BY L.PITT Performed at [facility]      Studies: No results found.  Scheduled Meds: . aspirin EC  81 mg Oral q morning - 10a  . Chlorhexidine Gluconate Cloth  6 each Topical Q0600  . furosemide  40 mg Oral Daily  . heparin  5,000 Units Subcutaneous 3 times per day  . insulin aspart  0-15 Units Subcutaneous TID WC  . insulin NPH Human  20 Units Subcutaneous BID AC & HS  . levofloxacin (LEVAQUIN) IV  750 mg Intravenous Q24H  . lisinopril  5 mg Oral Daily  . metoprolol  50 mg Oral BID  . mupirocin ointment  1 application Nasal BID  . pantoprazole  40 mg Oral Daily  . sodium chloride  3 mL Intravenous Q12H  . vancomycin  1,250 mg Intravenous Q12H   Continuous Infusions: . sodium chloride 10 mL/hr at 04/14/15 1633      Time spent: [place], [place] [facility] Pager  [phone]. If 7PM-7AM, please contact night-coverage at [email or web address], password [facility] 04/17/2015, 11:43 AM  LOS: 3 days

## 2015-04-17 NOTE — Progress Notes (Signed)
Received report from Dominic Pea, Therapist, sports. No change in previous assessment.

## 2015-04-18 DIAGNOSIS — I10 Essential (primary) hypertension: Secondary | ICD-10-CM | POA: Diagnosis not present

## 2015-04-18 DIAGNOSIS — Z8673 Personal history of transient ischemic attack (TIA), and cerebral infarction without residual deficits: Secondary | ICD-10-CM | POA: Diagnosis not present

## 2015-04-18 DIAGNOSIS — Z96651 Presence of right artificial knee joint: Secondary | ICD-10-CM | POA: Diagnosis present

## 2015-04-18 DIAGNOSIS — M6281 Muscle weakness (generalized): Secondary | ICD-10-CM | POA: Diagnosis not present

## 2015-04-18 DIAGNOSIS — I25119 Atherosclerotic heart disease of native coronary artery with unspecified angina pectoris: Secondary | ICD-10-CM | POA: Diagnosis not present

## 2015-04-18 DIAGNOSIS — I200 Unstable angina: Secondary | ICD-10-CM | POA: Diagnosis not present

## 2015-04-18 DIAGNOSIS — I255 Ischemic cardiomyopathy: Secondary | ICD-10-CM | POA: Diagnosis present

## 2015-04-18 DIAGNOSIS — K5900 Constipation, unspecified: Secondary | ICD-10-CM | POA: Diagnosis not present

## 2015-04-18 DIAGNOSIS — E1169 Type 2 diabetes mellitus with other specified complication: Secondary | ICD-10-CM | POA: Diagnosis not present

## 2015-04-18 DIAGNOSIS — R5381 Other malaise: Secondary | ICD-10-CM | POA: Diagnosis not present

## 2015-04-18 DIAGNOSIS — I441 Atrioventricular block, second degree: Secondary | ICD-10-CM | POA: Diagnosis present

## 2015-04-18 DIAGNOSIS — E785 Hyperlipidemia, unspecified: Secondary | ICD-10-CM | POA: Diagnosis not present

## 2015-04-18 DIAGNOSIS — Z794 Long term (current) use of insulin: Secondary | ICD-10-CM | POA: Diagnosis not present

## 2015-04-18 DIAGNOSIS — I69351 Hemiplegia and hemiparesis following cerebral infarction affecting right dominant side: Secondary | ICD-10-CM | POA: Diagnosis not present

## 2015-04-18 DIAGNOSIS — G4733 Obstructive sleep apnea (adult) (pediatric): Secondary | ICD-10-CM

## 2015-04-18 DIAGNOSIS — T8351XA Infection and inflammatory reaction due to indwelling urinary catheter, initial encounter: Secondary | ICD-10-CM | POA: Diagnosis not present

## 2015-04-18 DIAGNOSIS — N390 Urinary tract infection, site not specified: Secondary | ICD-10-CM | POA: Diagnosis not present

## 2015-04-18 DIAGNOSIS — I9751 Accidental puncture and laceration of a circulatory system organ or structure during a circulatory system procedure: Secondary | ICD-10-CM | POA: Diagnosis not present

## 2015-04-18 DIAGNOSIS — R931 Abnormal findings on diagnostic imaging of heart and coronary circulation: Secondary | ICD-10-CM | POA: Diagnosis not present

## 2015-04-18 DIAGNOSIS — R0789 Other chest pain: Secondary | ICD-10-CM | POA: Diagnosis not present

## 2015-04-18 DIAGNOSIS — I25110 Atherosclerotic heart disease of native coronary artery with unstable angina pectoris: Secondary | ICD-10-CM | POA: Diagnosis present

## 2015-04-18 DIAGNOSIS — I350 Nonrheumatic aortic (valve) stenosis: Secondary | ICD-10-CM | POA: Diagnosis present

## 2015-04-18 DIAGNOSIS — N320 Bladder-neck obstruction: Secondary | ICD-10-CM | POA: Diagnosis present

## 2015-04-18 DIAGNOSIS — B372 Candidiasis of skin and nail: Secondary | ICD-10-CM | POA: Diagnosis not present

## 2015-04-18 DIAGNOSIS — E118 Type 2 diabetes mellitus with unspecified complications: Secondary | ICD-10-CM | POA: Diagnosis not present

## 2015-04-18 DIAGNOSIS — R569 Unspecified convulsions: Secondary | ICD-10-CM | POA: Diagnosis present

## 2015-04-18 DIAGNOSIS — R55 Syncope and collapse: Secondary | ICD-10-CM | POA: Diagnosis not present

## 2015-04-18 DIAGNOSIS — Z87891 Personal history of nicotine dependence: Secondary | ICD-10-CM | POA: Diagnosis not present

## 2015-04-18 DIAGNOSIS — N319 Neuromuscular dysfunction of bladder, unspecified: Secondary | ICD-10-CM | POA: Diagnosis not present

## 2015-04-18 DIAGNOSIS — Z7982 Long term (current) use of aspirin: Secondary | ICD-10-CM | POA: Diagnosis not present

## 2015-04-18 DIAGNOSIS — R079 Chest pain, unspecified: Secondary | ICD-10-CM | POA: Diagnosis not present

## 2015-04-18 DIAGNOSIS — G9340 Encephalopathy, unspecified: Secondary | ICD-10-CM | POA: Diagnosis not present

## 2015-04-18 DIAGNOSIS — I495 Sick sinus syndrome: Secondary | ICD-10-CM | POA: Diagnosis not present

## 2015-04-18 DIAGNOSIS — I5032 Chronic diastolic (congestive) heart failure: Secondary | ICD-10-CM | POA: Diagnosis not present

## 2015-04-18 DIAGNOSIS — I447 Left bundle-branch block, unspecified: Secondary | ICD-10-CM | POA: Diagnosis present

## 2015-04-18 DIAGNOSIS — M545 Low back pain: Secondary | ICD-10-CM | POA: Diagnosis present

## 2015-04-18 DIAGNOSIS — L89151 Pressure ulcer of sacral region, stage 1: Secondary | ICD-10-CM | POA: Diagnosis not present

## 2015-04-18 DIAGNOSIS — E1140 Type 2 diabetes mellitus with diabetic neuropathy, unspecified: Secondary | ICD-10-CM | POA: Diagnosis not present

## 2015-04-18 DIAGNOSIS — L03116 Cellulitis of left lower limb: Secondary | ICD-10-CM | POA: Diagnosis not present

## 2015-04-18 DIAGNOSIS — Z6841 Body Mass Index (BMI) 40.0 and over, adult: Secondary | ICD-10-CM | POA: Diagnosis not present

## 2015-04-18 DIAGNOSIS — I472 Ventricular tachycardia: Secondary | ICD-10-CM | POA: Diagnosis not present

## 2015-04-18 DIAGNOSIS — K219 Gastro-esophageal reflux disease without esophagitis: Secondary | ICD-10-CM | POA: Diagnosis not present

## 2015-04-18 DIAGNOSIS — E669 Obesity, unspecified: Secondary | ICD-10-CM | POA: Diagnosis not present

## 2015-04-18 DIAGNOSIS — G8929 Other chronic pain: Secondary | ICD-10-CM | POA: Diagnosis present

## 2015-04-18 DIAGNOSIS — Y840 Cardiac catheterization as the cause of abnormal reaction of the patient, or of later complication, without mention of misadventure at the time of the procedure: Secondary | ICD-10-CM | POA: Diagnosis not present

## 2015-04-18 DIAGNOSIS — Z8679 Personal history of other diseases of the circulatory system: Secondary | ICD-10-CM | POA: Diagnosis not present

## 2015-04-18 DIAGNOSIS — I2510 Atherosclerotic heart disease of native coronary artery without angina pectoris: Secondary | ICD-10-CM | POA: Diagnosis not present

## 2015-04-18 DIAGNOSIS — Z23 Encounter for immunization: Secondary | ICD-10-CM | POA: Diagnosis not present

## 2015-04-18 LAB — CBC
HEMATOCRIT: 40.1 % (ref 39.0–52.0)
Hemoglobin: 13 g/dL (ref 13.0–17.0)
MCH: 28.7 pg (ref 26.0–34.0)
MCHC: 32.4 g/dL (ref 30.0–36.0)
MCV: 88.5 fL (ref 78.0–100.0)
Platelets: 247 10*3/uL (ref 150–400)
RBC: 4.53 MIL/uL (ref 4.22–5.81)
RDW: 12.9 % (ref 11.5–15.5)
WBC: 7.3 10*3/uL (ref 4.0–10.5)

## 2015-04-18 LAB — BASIC METABOLIC PANEL
ANION GAP: 5 (ref 5–15)
BUN: 14 mg/dL (ref 6–20)
CALCIUM: 8.8 mg/dL — AB (ref 8.9–10.3)
CHLORIDE: 101 mmol/L (ref 101–111)
CO2: 29 mmol/L (ref 22–32)
Creatinine, Ser: 0.68 mg/dL (ref 0.61–1.24)
GFR calc Af Amer: >60 mL/min (ref >60)
Glucose, Bld: 161 mg/dL — ABNORMAL HIGH (ref 65–99)
POTASSIUM: 4.1 mmol/L (ref 3.5–5.1)
Sodium: 135 mmol/L (ref 135–145)

## 2015-04-18 LAB — GLUCOSE, CAPILLARY
Glucose-Capillary: 199 mg/dL — ABNORMAL HIGH (ref 65–99)
Glucose-Capillary: 161 mg/dL — ABNORMAL HIGH (ref 65–99)

## 2015-04-18 LAB — TROPONIN I: Troponin I: <0.03 ng/mL (ref <0.031)

## 2015-04-18 MED ORDER — HYDROCODONE-ACETAMINOPHEN 5-325 MG PO TABS: 1.0000 | ORAL_TABLET | Freq: Four times a day (QID) | ORAL | Status: DC | PRN

## 2015-04-18 MED ORDER — NITROFURANTOIN MONOHYD MACRO 100 MG PO CAPS
100.0000 mg | ORAL_CAPSULE | Freq: Two times a day (BID) | ORAL | Status: DC
Start: 2015-04-18 — End: 2015-07-06

## 2015-04-18 MED ORDER — INSULIN NPH (HUMAN) (ISOPHANE) 100 UNIT/ML ~~LOC~~ SUSP: 20.0000 [IU] | Freq: Two times a day (BID) | SUBCUTANEOUS | Status: DC

## 2015-04-18 NOTE — Care Management Important Message (Signed)
Important Message  Patient Details IM Letter given to Kathy/ Case Manager to present to patientImportant Message  Patient Details  Name: GAWAIN TROKEY MRN: [id number] Date of Birth: Mar 23, 1946   Medicare Important Message Given:  [facility] notification given    Camillo Flaming 04/18/2015, 12:54 PM Name: COSIMO DOCTOR MRN: [id number] Date of Birth: 11/12/1945   Medicare Important Message Given:  Yes-second notification given    Camillo Flaming 04/18/2015, 12:53 PM

## 2015-04-18 NOTE — Progress Notes (Signed)
[facility] hospitalist progress note. Chief complaint. Chest pressure. History of present illness. This 69 year old male in hospital with encephalopathy, UTI, etc. He complained to nursing of central chest pressure. A 12-lead EKG was obtained and this did not appear different from prior EKGs. Current EKG does not look acutely ischemic at this time. Came to the patient's bedside and reports that pain/pressure is improving at this time. He denies any radiation, diaphoresis, or associated nausea. Vital signs. Temperature 97.8, pulse 72, respiration 18, blood pressure 137/60. O2 sats 100%. General appearance. Obese elderly male who is alert and in no distress. Cardiac. Rate and rhythm regular. Lungs. Breath sounds clear but reduced. Abdomen. Soft obese with positive bowel sounds. Impression/plan. Problem #1. Chest pressure. Patient had a similar incident yesterday. Troponins done and follow-up were unremarkable. I will repeat troponin I on an every 6 hours for a total of 3 sets. We'll also repeat an EKG in 6 hours to evaluate for any changes.

## 2015-04-18 NOTE — Progress Notes (Signed)
Pt refused blood draw for troponin

## 2015-04-18 NOTE — Discharge Summary (Signed)
Physician Discharge Summary  [name] [id number] DOB: 1946/08/14 DOA: 04/14/2015  PCP: Irven Shelling, MD  Admit date: 04/14/2015 Discharge date: 04/18/2015  Time spent: 35 minutes  Recommendations for Outpatient Follow-up:  1. Please treat with 5 days of Nitrofurantoin, anticipated stop date 04/23/2015 2. Follow up on CBC and BMP in 5 days 3. Patient discharged to SNF for rehab  Discharge Diagnoses:  Principal Problem:   UTI (lower urinary tract infection) Active Problems:   DM (diabetes mellitus) with complications   OSA (obstructive sleep apnea)   Hypertension   CAD- known RCA disease- medical Rx   CVA (cerebral infarction)   Syncope   Chronic diastolic CHF (congestive heart failure)   Mental confusion   Pressure ulcer   Discharge Condition: Stable  Diet recommendation: Heart Healthy  Filed Weights   04/16/15 0945 04/17/15 1300 04/18/15 0524  Weight: 160.4 kg (353 lb 9.9 oz) 161.753 kg (356 lb 9.6 oz) 157.126 kg (346 lb 6.4 oz)    History of present illness:  [name] is a 69 y.o. male, morbidly obese male with multiple medical problems putting diabetes, hypertension, sleep apnea, peripheral neuropathy from diabetes, chronic indwelling Foley catheter from diabetes-related neurogenic bladder, dyslipidemia, GERD, CAD and prior stroke, once with complaints of increased confusion and altered mental status, purulent discharge from Foley catheter, NAD patient was initially confused, patient was noticed to have purulent discharge from his Foley catheter, Foley catheter was changed in ED, with 6-700 mL came out upon insertion of new Foley catheter, patient was a febrile, but workup was significant for leukocytosis, and positive urinalysis, started empirically on IV vancomycin and levofloxacin in ED given history urine culture in the past growing MRSA and Citrobacter, CT head with no acute finding, by the time of my examination patient was back at baseline awake alert  oriented 3, denies any chest pain, fever, cough or productive sputum. - ED staff report patient having an episode of mild unresponsiveness during change of Foley catheter  Hospital Course:  69 y.o. male, morbidly obese male with multiple medical problems including diabetes, hypertension, sleep apnea, peripheral neuropathy from diabetes, chronic indwelling Foley catheter from diabetes-related neurogenic bladder, dyslipidemia, GERD, CAD and prior stroke,presented with altered mental status, purulent discharge from Foley catheter.  Foley changed in ED. patient was afebrile, but workup was significant for leukocytosis, and positive urinalysis.  Patient started empirically on IV vancomycin and levofloxacin in ED given history urine culture in the past growing MRSA and Citrobacter, CT head with no acute finding. Patient was back to his baseline when evaluated by admitting MD.  Patient admitted to telemetry.  Acute encephalopathy Possibly related to UTI. Head CT unremarkable however patient does report that he may have passed out.  Patient showing improvement On day of discharge he was awake and alert, following commands, functioning at his baseline.   UTI secondary to chronic indwelling Foley catheter Present on admission On chronic Foley due to neurogenic bladder. Foley change in the ED. Prior cultures growing MRSA and Citrobacter.  -Urine cultures growing multiple species present Urine cultures reporting multiple species present. Previous cultures from April 2016 grew methicillin-resistant -Staphylococcus aureus and Citrobacter, in looking at the susceptibilities of both organisms is appears both were sensitive to nitrofurantoin. IV AB's changed to Nitrofurantoin on 04/17/2015 -He will be discharged on Nitrofurantoin x 5 more days  Left shoulder pain Reports having acute left shoulder pain for possible fall at home yesterday. -Left shoulder x-ray did not show acute bony  abnormality  History of recurrent syncope Multiple episodes in the past. Syncopized at home. Possibly vasovagal. Echo done earlier this year showing grade 1 diastolic dysfunction.  Would recommend getting him up slowly with physical therapy.   Uncontrolled type 2 diabetes mellitus Humulin dose reduced upon admission given history of hypoglycemia 3 weeks back.  Obstructive sleep apnea Continue CPAP  Essential Hypertension Continue home blood pressure medications.  Coronary artery disease Continue aspirin, metoprolol and lisinopril. Stable  Chronic diastolic CHF This compensated, will continue Lasix 40 mg by mouth daily.   Consultations:  Physical Therapy  Discharge Exam: Filed Vitals:   04/18/15 0524  BP: 145/63  Pulse: 61  Temp: 97.7 F (36.5 C)  Resp: 18     General: Patient is awake and alert, following commands, nontoxic appearing, he was assisted out of bed to chair  HEENT: Moist oral mucosa, supple neck  Chest: Clear to auscultation bilaterally, normal respiratory effort  CVS: Normal S1 and S2, no murmurs  GI: Soft, nondistended, nontender, Foley in place  Musculoskeletal: Warm, bilateral lower extremity edema, venous stasis changes, lt shoulder pain with limited ROM  CNS: Alert and oriented  Discharge Instructions   Discharge Instructions    Call MD for:  difficulty breathing, headache or visual disturbances    Complete by:  As directed      Call MD for:  extreme fatigue    Complete by:  As directed      Call MD for:  hives    Complete by:  As directed      Call MD for:  persistant dizziness or light-headedness    Complete by:  As directed      Call MD for:  persistant nausea and vomiting    Complete by:  As directed      Call MD for:  redness, tenderness, or signs of infection (pain, swelling, redness, odor or green/yellow discharge around incision site)    Complete by:  As directed      Call MD for:  severe uncontrolled pain    Complete by:   As directed      Call MD for:  temperature >100.4    Complete by:  As directed      Call MD for:    Complete by:  As directed      Diet - low sodium heart healthy    Complete by:  As directed      Increase activity slowly    Complete by:  As directed           Current Discharge Medication List    START taking these medications   Details  nitrofurantoin, macrocrystal-monohydrate, (MACROBID) 100 MG capsule Take 1 capsule (100 mg total) by mouth 2 (two) times daily. Qty: 10 capsule, Refills: 0      CONTINUE these medications which have CHANGED   Details  HYDROcodone-acetaminophen (NORCO/VICODIN) 5-325 MG per tablet Take 1 tablet by mouth every 6 (six) hours as needed for severe pain. Qty: 15 tablet, Refills: 0    insulin NPH Human (HUMULIN N) 100 UNIT/ML injection Inject 0.2 mLs (20 Units total) into the skin 2 (two) times daily at 8 am and 10 pm. Qty: 10 mL, Refills: 11      CONTINUE these medications which have NOT CHANGED   Details  acetaminophen (TYLENOL) 325 MG tablet Take 2 tablets (650 mg total) by mouth every 4 (four) hours as needed for headache or mild pain.    aspirin EC 81 MG tablet Take 81  mg by mouth every morning.     calcium carbonate (TUMS - DOSED IN MG ELEMENTAL CALCIUM) 500 MG chewable tablet Chew 1 tablet (200 mg of elemental calcium total) by mouth 3 (three) times daily as needed for indigestion or heartburn.    diphenoxylate-atropine (LOMOTIL) 2.5-0.025 MG per tablet Take 1 tablet by mouth 3 (three) times daily as needed. For diarrhea Qty: 90 tablet, Refills: 0    fexofenadine (ALLEGRA) 180 MG tablet Take 180 mg by mouth at bedtime as needed for allergies.     furosemide (LASIX) 40 MG tablet Take 1 tablet (40 mg total) by mouth daily. Qty: 30 tablet, Refills: 11    lisinopril (PRINIVIL,ZESTRIL) 5 MG tablet TAKE 1 TABLET (5 MG TOTAL) BY MOUTH DAILY. Qty: 90 tablet, Refills: 0    metoprolol (LOPRESSOR) 50 MG tablet Take 1 tablet (50 mg total) by mouth  2 (two) times daily.    nitroGLYCERIN (NITROSTAT) 0.4 MG SL tablet Place 1 tablet (0.4 mg total) under the tongue every 5 (five) minutes as needed for chest pain. Qty: 25 tablet, Refills: 2    omeprazole (PRILOSEC) 20 MG capsule Take 20 mg by mouth daily.    oxybutynin (DITROPAN) 5 MG tablet Take 5 mg by mouth 3 (three) times daily as needed for bladder spasms.    Sennosides 25 MG TABS Take 25 mg by mouth daily as needed (for constipation).      STOP taking these medications     diphenhydrAMINE (SOMINEX) 25 MG tablet        Allergies  Allergen Reactions  . Ace Inhibitors Swelling    Pt tolerates lisinopril  . Lipitor [Atorvastatin Calcium] Swelling  . Metformin And Related Swelling  . Cefadroxil Hives  . Cephalexin Hives  . Morphine And Related Other (See Comments)    Sweating, feels like is "in rocky boat."  . Robaxin [Methocarbamol] Other (See Comments)    Feels like he is shaky   Follow-up Information    Follow up with Irven Shelling, MD In 1 week.   Specialty:  Internal Medicine   Contact information:   [address] [place] [address] [phone]        The results of significant diagnostics from this hospitalization (including imaging, microbiology, ancillary and laboratory) are listed below for reference.    Significant Diagnostic Studies: Ct Head Wo Contrast  04/14/2015   CLINICAL DATA:  Altered mental status.  Urinary tract infection.  EXAM: CT HEAD WITHOUT CONTRAST  TECHNIQUE: Contiguous axial images were obtained from the base of the skull through the vertex without intravenous contrast.  COMPARISON:  Head MRI and CT 11/15/2014  FINDINGS: Symmetric mineralization is again seen in the basal ganglia and thalami bilaterally. Ventricles and sulci are within normal limits for age. Chronic lacunar infarct is again seen in the left caudate. There is no evidence of acute cortical infarct, intracranial hemorrhage, mass, midline shift, or  extra-axial fluid collection.  Orbits are unremarkable. The visualized paranasal sinuses and mastoid air cells are clear.  IMPRESSION: No evidence of acute intracranial abnormality.   Electronically Signed   By: Logan Bores   On: 04/14/2015 14:59   Dg Shoulder Left  04/15/2015   CLINICAL DATA:  Acute left shoulder pain concentrated at lateral humeral head. No known injury.  EXAM: LEFT SHOULDER - 2+ VIEW  COMPARISON:  None.  FINDINGS: Mild degenerative changes in the left [facility] joint with joint space narrowing and spurring. No acute bony abnormality. Specifically, no fracture,  subluxation, or dislocation. Soft tissues are intact.  IMPRESSION: No acute bony abnormality.   Electronically Signed   By: Rolm Baptise M.D.   On: 04/15/2015 11:51    Microbiology: Recent Results (from the past 240 hour(s))  Blood Culture (routine x 2)     Status: None (Preliminary result)   Collection Time: 04/14/15 12:30 PM  Result Value Ref Range Status   Specimen Description BLOOD RIGHT ANTECUBITAL  Final   Special Requests BOTTLES DRAWN AEROBIC AND ANAEROBIC 5ML  Final   Culture   Final    NO GROWTH 3 DAYS Performed at [facility]    Report Status PENDING  Incomplete  Blood Culture (routine x 2)     Status: None (Preliminary result)   Collection Time: 04/14/15 12:33 PM  Result Value Ref Range Status   Specimen Description BLOOD LEFT ANTECUBITAL  Final   Special Requests BOTTLES DRAWN AEROBIC AND ANAEROBIC 4.5ML  Final   Culture   Final    NO GROWTH 3 DAYS Performed at [facility]    Report Status PENDING  Incomplete  Urine culture     Status: None   Collection Time: 04/14/15  1:03 PM  Result Value Ref Range Status   Specimen Description URINE, RANDOM  Final   Special Requests NONE  Final   Culture   Final    MULTIPLE SPECIES PRESENT, SUGGEST RECOLLECTION Performed at [facility]    Report Status 04/16/2015 FINAL  Final  MRSA PCR Screening     Status: Abnormal   Collection Time:  04/14/15  8:25 PM  Result Value Ref Range Status   MRSA by PCR POSITIVE (A) NEGATIVE Final    Comment:        The GeneXpert MRSA Assay (FDA approved for NASAL specimens only), is one component of a comprehensive MRSA colonization surveillance program. It is not intended to diagnose MRSA infection nor to guide or monitor treatment for MRSA infections. RESULT CALLED TO, READ BACK BY AND VERIFIED WITH: E.MADRIAGA,RN AT 0045 04/15/15 BY L.PITT Performed at [facility]: Basic Metabolic Panel:  Recent Labs Lab 04/14/15 1227 04/15/15 0455 04/16/15 0132 04/17/15 0150 04/18/15 0500  NA 130* 135  --   --  135  K 4.6 4.6  --   --  4.1  CL 96* 101  --   --  101  CO2 26 28  --   --  29  GLUCOSE 358* 179*  --   --  161*  BUN 15 13  --   --  14  CREATININE 0.82 0.70  --   --  0.68  CALCIUM 8.8* 8.3*  --   --  8.8*  MG  --   --  1.7 1.8  --    Liver Function Tests:  Recent Labs Lab 04/14/15 1227  AST 15  ALT 11*  ALKPHOS 102  BILITOT 0.8  PROT 7.6  ALBUMIN 3.5   No results for input(s): LIPASE, AMYLASE in the last 168 hours. No results for input(s): AMMONIA in the last 168 hours. CBC:  Recent Labs Lab 04/14/15 1227 04/15/15 0455 04/18/15 0500  WBC 13.1* 9.7 7.3  NEUTROABS 9.9*  --   --   HGB 14.5 13.0 13.0  HCT 42.2 39.6 40.1  MCV 87.4 89.8 88.5  PLT 285 264 247   Cardiac Enzymes:  Recent Labs Lab 04/17/15 0150 04/17/15 0709  TROPONINI <0.03 <0.03   BNP: BNP (last 3 results)  Recent Labs  09/14/14 0644 12/22/14 2330  BNP 95.8 120.1*    ProBNP (last 3 results)  Recent Labs  08/27/14 1548  PROBNP 1121.0*    CBG:  Recent Labs Lab 04/17/15 0737 04/17/15 1130 04/17/15 1637 04/17/15 2207 04/18/15 0816  GLUCAP 151* 180* 142* 173* 199*       Signed:  Cassandra Mcmanaman  [facility] 04/18/2015, 8:43 AM

## 2015-04-18 NOTE — Progress Notes (Signed)
Report given to Darlen Round, RN at [facility]. Pt will be transported via EMS to facility.

## 2015-04-18 NOTE — Clinical Social Work Placement (Signed)
Patient is set to discharge to [facility] SNF today. Patient & family aware. Social Worker at [employer], Bryson Ha to go by patient's home tomorrow to pickup requested items, patient is aware & ok with plan. Discharge packet given to RN, Doroteo Bradford. [facility] will be called for transport once finished with his lunch.     [name], [facility] Clinical Social Worker cell #: [phone]    CLINICAL SOCIAL WORK PLACEMENT  NOTE  Date:  04/18/2015  Patient Details  Name: [name] MRN: [id number] Date of Birth: 1946-08-23  Clinical Social Work is seeking post-discharge placement for this patient at the [place] level of care (*[name] will initial, date and re-position this form in  chart as items are completed):  Yes   Patient/family provided with [place] Work Department's list of facilities offering this level of care within the geographic area requested by the patient (or if unable, by the patient's family).  Yes   Patient/family informed of their freedom to choose among providers that offer the needed level of care, that participate in Medicare, Medicaid or managed care program needed by the patient, have an available bed and are willing to accept the patient.  Yes   Patient/family informed of [facility]'s ownership interest in [facility] and [facility], as well as of the fact that they are under no obligation to receive care at these facilities.  PASRR submitted to EDS on 04/15/15     PASRR number received on 04/15/15     Existing PASRR number confirmed on       FL2 transmitted to all facilities in geographic area requested by pt/family on 04/15/15     FL2 transmitted to all facilities within larger geographic area on       Patient informed that his/her managed care company has contracts with or will negotiate with certain facilities, including the following:        Yes   Patient/family  informed of bed offers received.  Patient chooses bed at [facility]     Physician recommends and patient chooses bed at      Patient to be transferred to [facility] on 04/18/15.  Patient to be transferred to facility by [facility]     Patient family notified on 04/18/15 of transfer.  Name of family member notified:  patient's informed his family     PHYSICIAN       Additional Comment:    _______________________________________________ Standley Brooking, LCSW 04/18/2015, 12:13 PM

## 2015-04-18 NOTE — Progress Notes (Signed)
[facility] called for transport, RN - Erika aware.      [name], [facility] Clinical Social Worker cell #: [phone]

## 2015-04-19 ENCOUNTER — Non-Acute Institutional Stay (SKILLED_NURSING_FACILITY): Payer: Medicare Other | Admitting: Internal Medicine

## 2015-04-19 DIAGNOSIS — N390 Urinary tract infection, site not specified: Secondary | ICD-10-CM

## 2015-04-19 DIAGNOSIS — I5032 Chronic diastolic (congestive) heart failure: Secondary | ICD-10-CM

## 2015-04-19 DIAGNOSIS — N319 Neuromuscular dysfunction of bladder, unspecified: Secondary | ICD-10-CM

## 2015-04-19 DIAGNOSIS — B372 Candidiasis of skin and nail: Secondary | ICD-10-CM | POA: Diagnosis not present

## 2015-04-19 DIAGNOSIS — E118 Type 2 diabetes mellitus with unspecified complications: Secondary | ICD-10-CM

## 2015-04-19 DIAGNOSIS — I10 Essential (primary) hypertension: Secondary | ICD-10-CM

## 2015-04-19 DIAGNOSIS — G4733 Obstructive sleep apnea (adult) (pediatric): Secondary | ICD-10-CM

## 2015-04-19 DIAGNOSIS — I25119 Atherosclerotic heart disease of native coronary artery with unspecified angina pectoris: Secondary | ICD-10-CM

## 2015-04-19 DIAGNOSIS — E669 Obesity, unspecified: Secondary | ICD-10-CM

## 2015-04-19 DIAGNOSIS — K219 Gastro-esophageal reflux disease without esophagitis: Secondary | ICD-10-CM

## 2015-04-19 DIAGNOSIS — R5381 Other malaise: Secondary | ICD-10-CM | POA: Diagnosis not present

## 2015-04-19 LAB — CULTURE, BLOOD (ROUTINE X 2)
Culture: NO GROWTH
Culture: NO GROWTH

## 2015-04-21 NOTE — Progress Notes (Signed)
Patient ID: [name], male   DOB: 1945-09-22, 69 y.o.   MRN: [id number]    HISTORY AND PHYSICAL   DATE: 04/19/15  Location:  [facility] Service: SNF [phone])   Extended Emergency Contact Information Primary Emergency Contact: Whiting,[name] Address: [address]          [place], [place] [address] [name] of [place] Phone: [phone] Relation: Friend Secondary Emergency Contact: [place] of [place] Phone: [phone] Relation: None  Advanced Directive information  DNR  Chief Complaint  Patient presents with  . New Admit To SNF    HPI:  69 yo male seen today as a new admission into SNF following hospital stay for UTI with change in mental status. He was tx with IV vanco and levaquin in the ED and was changed to nitrofurantoin due to C and s results. CT head neg for acute changes. His foley cath was changed.  Today, he has no c/o and reports feeling better since admission. CBG 234 today. No low BS reactions. He takes insulin BID. BP/CHF controlled on lisinopril, metoprolol, lasix. He takes an ASA daily for CAD hx and HTN. He will complete UTI  abx on 8/13th. Pain is controlled with norco. GERD stable on omeprazole. He has a chronic foley cath due to neurogenic bladder. He takes oxybutynin for bladder spasms   Past Medical History  Diagnosis Date  . Obese   . Hypertension   . Diabetic neuropathy, painful   . Chronic indwelling Foley catheter   . Bladder spasms   . Hyperlipidemia   . GERD (gastroesophageal reflux disease)   . Neurogenic bladder   . CAD (coronary artery disease)   . PONV (postoperative nausea and vomiting)   . CHF (congestive heart failure)   . DVT (deep venous thrombosis) 1980's    LLE  . Type II diabetes mellitus   . Diabetic diarrhea   . Pneumonia 1999  . OSA (obstructive sleep apnea)     "they wanted me to wear a mask; I couldn't" (11/10/2014)  . Arthritis     "hands, ankles,  knees" (11/10/2014)  . Chronic lower back pain   . Stroke 10/2011; 06/2014    right hand numbness/notes 10/20/2011, pt not sure he had stroke in 2013; "right hand weaker and right face not quite right since" (11/10/2014)    Past Surgical History  Procedure Laterality Date  . Gastroplasty    . Total knee arthroplasty Right 1990's  . Left heart catheterization with coronary angiogram N/A 10/24/2011    Procedure: LEFT HEART CATHETERIZATION WITH CORONARY ANGIOGRAM;  Surgeon: Candee Furbish, MD;  Location: [facility] CATH LAB;  Service: Cardiovascular;  Laterality: N/A;  right radial artery approach  . Cholecystectomy open  1970's?  . Joint replacement    . Carpal tunnel release Bilateral ~ 2003-2004    Patient Care Team: Lavone Orn, MD as PCP - General (Internal Medicine)  Social History   Social History  . Marital Status: Widowed    Spouse Name: N/A  . Number of Children: N/A  . Years of Education: N/A   Occupational History  . Retired    Social History Main Topics  . Smoking status: Former Smoker -- 2.00 packs/day for 20 years    Types: Cigarettes    Quit date: 09/10/1976  . Smokeless tobacco: Never Used  . Alcohol Use: 0.0 oz/week     Comment: FORMER ALCOHOLIC; "sober since 76/81/1572"  . Drug Use:  No  . Sexual Activity: No   Other Topics Concern  . Not on file   Social History Narrative     reports that he quit smoking about 38 years ago. His smoking use included Cigarettes. He has a 40 pack-year smoking history. He has never used smokeless tobacco. He reports that he drinks alcohol. He reports that he does not use illicit drugs.  Family History  Problem Relation Age of Onset  . Hypertension Mother   . Diabetes type I Father   . Cancer Brother      Testicular Cancer  . Cancer Brother     Leukemia   Family Status  Relation Status Death Age  . Father Deceased     DM/DVT  . Mother Deceased     old age  . Brother Alive   . Brother Marketing executive History    Administered Date(s) Administered  . Influenza Split 10/12/2011, 10/13/2011  . Influenza-Unspecified 06/10/2014    Allergies  Allergen Reactions  . Ace Inhibitors Swelling    Pt tolerates lisinopril  . Lipitor [Atorvastatin Calcium] Swelling  . Metformin And Related Swelling  . Cefadroxil Hives  . Cephalexin Hives  . Morphine And Related Other (See Comments)    Sweating, feels like is "in rocky boat."  . Robaxin [Methocarbamol] Other (See Comments)    Feels like he is shaky    Medications: Patient's Medications  New Prescriptions   No medications on file  Previous Medications   ACETAMINOPHEN (TYLENOL) 325 MG TABLET    Take 2 tablets (650 mg total) by mouth every 4 (four) hours as needed for headache or mild pain.   ASPIRIN EC 81 MG TABLET    Take 81 mg by mouth every morning.    CALCIUM CARBONATE (TUMS - DOSED IN MG ELEMENTAL CALCIUM) 500 MG CHEWABLE TABLET    Chew 1 tablet (200 mg of elemental calcium total) by mouth 3 (three) times daily as needed for indigestion or heartburn.   DIPHENOXYLATE-ATROPINE (LOMOTIL) 2.5-0.025 MG PER TABLET    Take 1 tablet by mouth 3 (three) times daily as needed. For diarrhea   FEXOFENADINE (ALLEGRA) 180 MG TABLET    Take 180 mg by mouth at bedtime as needed for allergies.    FUROSEMIDE (LASIX) 40 MG TABLET    Take 1 tablet (40 mg total) by mouth daily.   HYDROCODONE-ACETAMINOPHEN (NORCO/VICODIN) 5-325 MG PER TABLET    Take 1 tablet by mouth every 6 (six) hours as needed for severe pain.   INSULIN NPH HUMAN (HUMULIN N) 100 UNIT/ML INJECTION    Inject 0.2 mLs (20 Units total) into the skin 2 (two) times daily at 8 am and 10 pm.   LISINOPRIL (PRINIVIL,ZESTRIL) 5 MG TABLET    TAKE 1 TABLET (5 MG TOTAL) BY MOUTH DAILY.   METOPROLOL (LOPRESSOR) 50 MG TABLET    Take 1 tablet (50 mg total) by mouth 2 (two) times daily.   NITROFURANTOIN, MACROCRYSTAL-MONOHYDRATE, (MACROBID) 100 MG CAPSULE    Take 1 capsule (100 mg total) by mouth 2 (two) times daily.    NITROGLYCERIN (NITROSTAT) 0.4 MG SL TABLET    Place 1 tablet (0.4 mg total) under the tongue every 5 (five) minutes as needed for chest pain.   OMEPRAZOLE (PRILOSEC) 20 MG CAPSULE    Take 20 mg by mouth daily.   OXYBUTYNIN (DITROPAN) 5 MG TABLET    Take 5 mg by mouth 3 (three) times daily as needed for bladder spasms.  SENNOSIDES 25 MG TABS    Take 25 mg by mouth daily as needed (for constipation).  Modified Medications   No medications on file  Discontinued Medications   No medications on file    Review of Systems  Constitutional: Negative for chills, activity change and fatigue.  HENT: Negative for sore throat and trouble swallowing.   Eyes: Negative for visual disturbance.  Respiratory: Negative for cough, chest tightness and shortness of breath.   Cardiovascular: Negative for chest pain, palpitations and leg swelling.  Gastrointestinal: Positive for constipation. Negative for nausea, vomiting, abdominal pain and blood in stool.  Genitourinary: Positive for difficulty urinating. Negative for dysuria, urgency and frequency.  Musculoskeletal: Positive for arthralgias and gait problem.  Skin: Negative for rash.  Neurological: Negative for weakness and headaches.  Psychiatric/Behavioral: Negative for confusion and sleep disturbance. The patient is not nervous/anxious.     Filed Vitals:   04/19/15 1816  BP: 107/54  Pulse: 79  Temp: 98.1 F (36.7 C)  Weight: 350 lb (158.759 kg)  SpO2: 98%   Body mass index is 46.19 kg/(m^2).  Physical Exam  Constitutional: He is oriented to person, place, and time. He appears well-developed and well-nourished.  Lying in bed in NAD  HENT:  Mouth/Throat: Oropharynx is clear and moist.  Eyes: Pupils are equal, round, and reactive to light. No scleral icterus.  Neck: Neck supple. Carotid bruit is not present. No thyromegaly present.  Cardiovascular: Normal rate, regular rhythm, normal heart sounds and intact distal pulses.  Exam reveals no gallop  and no friction rub.   No murmur heard. no distal LE swelling. No calf TTP  Pulmonary/Chest: Effort normal and breath sounds normal. He has no wheezes. He has no rales. He exhibits no tenderness.  Abdominal: Soft. Bowel sounds are normal. He exhibits no distension, no abdominal bruit, no pulsatile midline mass and no mass. There is no tenderness. There is no rebound and no guarding.  Lymphadenopathy:    He has no cervical adenopathy.  Neurological: He is alert and oriented to person, place, and time. He has normal reflexes.  Skin: Skin is warm and dry. Rash (chronic venous stasis changes in b/l LE. right inguinal and abdominal pannus angry appearing wet  red rash with no blisters or secondary signs of infection) noted.  Psychiatric: His behavior is normal. Judgment and thought content normal. He exhibits a depressed mood.     Labs reviewed: Admission on 04/14/2015, Discharged on 04/18/2015  Component Date Value Ref Range Status  . Sodium 04/14/2015 130* 135 - 145 mmol/L Final  . Potassium 04/14/2015 4.6  3.5 - 5.1 mmol/L Final  . Chloride 04/14/2015 96* 101 - 111 mmol/L Final  . CO2 04/14/2015 26  22 - 32 mmol/L Final  . Glucose, Bld 04/14/2015 358* 65 - 99 mg/dL Final  . BUN 04/14/2015 15  6 - 20 mg/dL Final  . Creatinine, Ser 04/14/2015 0.82  0.61 - 1.24 mg/dL Final  . Calcium 04/14/2015 8.8* 8.9 - 10.3 mg/dL Final  . Total Protein 04/14/2015 7.6  6.5 - 8.1 g/dL Final  . Albumin 04/14/2015 3.5  3.5 - 5.0 g/dL Final  . AST 04/14/2015 15  15 - 41 U/L Final  . ALT 04/14/2015 11* 17 - 63 U/L Final  . Alkaline Phosphatase 04/14/2015 102  38 - 126 U/L Final  . Total Bilirubin 04/14/2015 0.8  0.3 - 1.2 mg/dL Final  . GFR calc non Af Amer 04/14/2015 >60  >60 mL/min Final  . GFR calc Af  Amer 04/14/2015 >60  >60 mL/min Final   Comment: (NOTE) The eGFR has been calculated using the CKD EPI equation. This calculation has not been validated in all clinical situations. eGFR's persistently <60  mL/min signify possible Chronic Kidney Disease.   . Anion gap 04/14/2015 8  5 - 15 Final  . WBC 04/14/2015 13.1* 4.0 - 10.5 K/uL Final  . RBC 04/14/2015 4.83  4.22 - 5.81 MIL/uL Final  . Hemoglobin 04/14/2015 14.5  13.0 - 17.0 g/dL Final  . HCT 04/14/2015 42.2  39.0 - 52.0 % Final  . MCV 04/14/2015 87.4  78.0 - 100.0 fL Final  . MCH 04/14/2015 30.0  26.0 - 34.0 pg Final  . MCHC 04/14/2015 34.4  30.0 - 36.0 g/dL Final  . RDW 04/14/2015 13.0  11.5 - 15.5 % Final  . Platelets 04/14/2015 285  150 - 400 K/uL Final  . Neutrophils Relative % 04/14/2015 76  43 - 77 % Final  . Neutro Abs 04/14/2015 9.9* 1.7 - 7.7 K/uL Final  . Lymphocytes Relative 04/14/2015 12  12 - 46 % Final  . Lymphs Abs 04/14/2015 1.6  0.7 - 4.0 K/uL Final  . Monocytes Relative 04/14/2015 5  3 - 12 % Final  . Monocytes Absolute 04/14/2015 0.7  0.1 - 1.0 K/uL Final  . Eosinophils Relative 04/14/2015 6* 0 - 5 % Final  . Eosinophils Absolute 04/14/2015 0.8* 0.0 - 0.7 K/uL Final  . Basophils Relative 04/14/2015 1  0 - 1 % Final  . Basophils Absolute 04/14/2015 0.1  0.0 - 0.1 K/uL Final  . Specimen Description 04/14/2015 BLOOD RIGHT ANTECUBITAL   Final  . Special Requests 04/14/2015 BOTTLES DRAWN AEROBIC AND ANAEROBIC 5ML   Final  . Culture 04/14/2015    Final                   Value:NO GROWTH 5 DAYS Performed at [facility]   . Report Status 04/14/2015 04/19/2015 FINAL   Final  . Specimen Description 04/14/2015 BLOOD LEFT ANTECUBITAL   Final  . Special Requests 04/14/2015 BOTTLES DRAWN AEROBIC AND ANAEROBIC 4.5ML   Final  . Culture 04/14/2015    Final                   Value:NO GROWTH 5 DAYS Performed at [facility]   . Report Status 04/14/2015 04/19/2015 FINAL   Final  . Color, Urine 04/14/2015 YELLOW  YELLOW Final  . APPearance 04/14/2015 CLOUDY* CLEAR Final  . Specific Gravity, Urine 04/14/2015 1.011  1.005 - 1.030 Final  . pH 04/14/2015 5.0  5.0 - 8.0 Final  . Glucose, UA 04/14/2015 500* NEGATIVE  mg/dL Final  . Hgb urine dipstick 04/14/2015 LARGE* NEGATIVE Final  . Bilirubin Urine 04/14/2015 NEGATIVE  NEGATIVE Final  . Ketones, ur 04/14/2015 NEGATIVE  NEGATIVE mg/dL Final  . Protein, ur 04/14/2015 100* NEGATIVE mg/dL Final  . Urobilinogen, UA 04/14/2015 0.2  0.0 - 1.0 mg/dL Final  . Nitrite 04/14/2015 NEGATIVE  NEGATIVE Final  . Leukocytes, UA 04/14/2015 MODERATE* NEGATIVE Final  . Specimen Description 04/14/2015 URINE, RANDOM   Final  . Special Requests 04/14/2015 NONE   Final  . Culture 04/14/2015    Final                   Value:MULTIPLE SPECIES PRESENT, SUGGEST RECOLLECTION Performed at [facility]   . Report Status 04/14/2015 04/16/2015 FINAL   Final  . Lactic Acid, Venous 04/14/2015 1.5  0.5 -  2.0 mmol/L Final  . Lactic Acid, Venous 04/14/2015 1.2  0.5 - 2.0 mmol/L Final  . WBC, UA 04/14/2015 21-50  <3 WBC/hpf Final  . RBC / HPF 04/14/2015 21-50  <3 RBC/hpf Final  . Bacteria, UA 04/14/2015 MANY* RARE Final  . Hgb A1c MFr Bld 04/14/2015 9.6* 4.8 - 5.6 % Final   Comment: (NOTE)         Pre-diabetes: 5.7 - 6.4         Diabetes: >6.4         Glycemic control for adults with diabetes: <7.0   . Mean Plasma Glucose 04/14/2015 229   Final   Comment: (NOTE) Performed At: [facility] [place], [place] [id number] Lindon Romp MD NO:[id number]   . Sodium 04/15/2015 135  135 - 145 mmol/L Final  . Potassium 04/15/2015 4.6  3.5 - 5.1 mmol/L Final  . Chloride 04/15/2015 101  101 - 111 mmol/L Final  . CO2 04/15/2015 28  22 - 32 mmol/L Final  . Glucose, Bld 04/15/2015 179* 65 - 99 mg/dL Final  . BUN 04/15/2015 13  6 - 20 mg/dL Final  . Creatinine, Ser 04/15/2015 0.70  0.61 - 1.24 mg/dL Final  . Calcium 04/15/2015 8.3* 8.9 - 10.3 mg/dL Final  . GFR calc non Af Amer 04/15/2015 >60  >60 mL/min Final  . GFR calc Af Amer 04/15/2015 >60  >60 mL/min Final   Comment: (NOTE) The eGFR has been calculated using the CKD EPI equation. This calculation has  not been validated in all clinical situations. eGFR's persistently <60 mL/min signify possible Chronic Kidney Disease.   . Anion gap 04/15/2015 6  5 - 15 Final  . WBC 04/15/2015 9.7  4.0 - 10.5 K/uL Final  . RBC 04/15/2015 4.41  4.22 - 5.81 MIL/uL Final  . Hemoglobin 04/15/2015 13.0  13.0 - 17.0 g/dL Final  . HCT 04/15/2015 39.6  39.0 - 52.0 % Final  . MCV 04/15/2015 89.8  78.0 - 100.0 fL Final  . MCH 04/15/2015 29.5  26.0 - 34.0 pg Final  . MCHC 04/15/2015 32.8  30.0 - 36.0 g/dL Final  . RDW 04/15/2015 13.2  11.5 - 15.5 % Final  . Platelets 04/15/2015 264  150 - 400 K/uL Final  . Glucose-Capillary 04/14/2015 301* 65 - 99 mg/dL Final  . Comment 1 04/14/2015 Notify RN   Final  . Comment 2 04/14/2015 Document in Chart   Final  . MRSA by PCR 04/14/2015 POSITIVE* NEGATIVE Final   Comment:        The GeneXpert MRSA Assay (FDA approved for NASAL specimens only), is one component of a comprehensive MRSA colonization surveillance program. It is not intended to diagnose MRSA infection nor to guide or monitor treatment for MRSA infections. RESULT CALLED TO, READ BACK BY AND VERIFIED WITH: E.MADRIAGA,RN AT 0045 04/15/15 BY L.PITT Performed at [facility]   . Glucose-Capillary 04/14/2015 208* 65 - 99 mg/dL Final  . Comment 1 04/14/2015 Notify RN   Final  . Comment 2 04/14/2015 Document in Chart   Final  . Glucose-Capillary 04/15/2015 129* 65 - 99 mg/dL Final  . Glucose-Capillary 04/15/2015 180* 65 - 99 mg/dL Final  . Glucose-Capillary 04/15/2015 146* 65 - 99 mg/dL Final  . Glucose-Capillary 04/15/2015 220* 65 - 99 mg/dL Final  . Comment 1 04/15/2015 Notify RN   Final  . Comment 2 04/15/2015 Document in Chart   Final  . Comment 3 04/15/2015 Glucose Stabilizer   Final  .  Magnesium 04/16/2015 1.7  1.7 - 2.4 mg/dL Final  . Glucose-Capillary 04/16/2015 147* 65 - 99 mg/dL Final  . Comment 1 04/16/2015 Notify RN   Final  . Comment 2 04/16/2015 Document in Chart   Final  .  Glucose-Capillary 04/16/2015 133* 65 - 99 mg/dL Final  . Comment 1 04/16/2015 Notify RN   Final  . Comment 2 04/16/2015 Document in Chart   Final  . Vancomycin Tr 04/16/2015 13  10.0 - 20.0 ug/mL Final  . Glucose-Capillary 04/16/2015 143* 65 - 99 mg/dL Final  . Comment 1 04/16/2015 Notify RN   Final  . Comment 2 04/16/2015 Document in Chart   Final  . Glucose-Capillary 04/16/2015 187* 65 - 99 mg/dL Final  . Comment 1 04/16/2015 Notify RN   Final  . Magnesium 04/17/2015 1.8  1.7 - 2.4 mg/dL Final  . Troponin I 04/17/2015 <0.03  <0.031 ng/mL Final   Comment:        NO INDICATION OF MYOCARDIAL INJURY.   . Troponin I 04/17/2015 <0.03  <0.031 ng/mL Final   Comment:        NO INDICATION OF MYOCARDIAL INJURY.   . Glucose-Capillary 04/17/2015 151* 65 - 99 mg/dL Final  . Glucose-Capillary 04/17/2015 180* 65 - 99 mg/dL Final  . Glucose-Capillary 04/17/2015 142* 65 - 99 mg/dL Final  . Sodium 04/18/2015 135  135 - 145 mmol/L Final  . Potassium 04/18/2015 4.1  3.5 - 5.1 mmol/L Final  . Chloride 04/18/2015 101  101 - 111 mmol/L Final  . CO2 04/18/2015 29  22 - 32 mmol/L Final  . Glucose, Bld 04/18/2015 161* 65 - 99 mg/dL Final  . BUN 04/18/2015 14  6 - 20 mg/dL Final  . Creatinine, Ser 04/18/2015 0.68  0.61 - 1.24 mg/dL Final  . Calcium 04/18/2015 8.8* 8.9 - 10.3 mg/dL Final  . GFR calc non Af Amer 04/18/2015 >60  >60 mL/min Final  . GFR calc Af Amer 04/18/2015 >60  >60 mL/min Final   Comment: (NOTE) The eGFR has been calculated using the CKD EPI equation. This calculation has not been validated in all clinical situations. eGFR's persistently <60 mL/min signify possible Chronic Kidney Disease.   . Anion gap 04/18/2015 5  5 - 15 Final  . WBC 04/18/2015 7.3  4.0 - 10.5 K/uL Final  . RBC 04/18/2015 4.53  4.22 - 5.81 MIL/uL Final  . Hemoglobin 04/18/2015 13.0  13.0 - 17.0 g/dL Final  . HCT 04/18/2015 40.1  39.0 - 52.0 % Final  . MCV 04/18/2015 88.5  78.0 - 100.0 fL Final  . MCH 04/18/2015  28.7  26.0 - 34.0 pg Final  . MCHC 04/18/2015 32.4  30.0 - 36.0 g/dL Final  . RDW 04/18/2015 12.9  11.5 - 15.5 % Final  . Platelets 04/18/2015 247  150 - 400 K/uL Final  . Glucose-Capillary 04/17/2015 173* 65 - 99 mg/dL Final  . Troponin I 04/18/2015 <0.03  <0.031 ng/mL Final   Comment:        NO INDICATION OF MYOCARDIAL INJURY.   . Glucose-Capillary 04/18/2015 199* 65 - 99 mg/dL Final  . Glucose-Capillary 04/18/2015 161* 65 - 99 mg/dL Final    Ct Head Wo Contrast  04/14/2015   CLINICAL DATA:  Altered mental status.  Urinary tract infection.  EXAM: CT HEAD WITHOUT CONTRAST  TECHNIQUE: Contiguous axial images were obtained from the base of the skull through the vertex without intravenous contrast.  COMPARISON:  Head MRI and CT 11/15/2014  FINDINGS: Symmetric mineralization  is again seen in the basal ganglia and thalami bilaterally. Ventricles and sulci are within normal limits for age. Chronic lacunar infarct is again seen in the left caudate. There is no evidence of acute cortical infarct, intracranial hemorrhage, mass, midline shift, or extra-axial fluid collection.  Orbits are unremarkable. The visualized paranasal sinuses and mastoid air cells are clear.  IMPRESSION: No evidence of acute intracranial abnormality.   Electronically Signed   By: Logan Bores   On: 04/14/2015 14:59   Dg Shoulder Left  04/15/2015   CLINICAL DATA:  Acute left shoulder pain concentrated at lateral humeral head. No known injury.  EXAM: LEFT SHOULDER - 2+ VIEW  COMPARISON:  None.  FINDINGS: Mild degenerative changes in the left [facility] joint with joint space narrowing and spurring. No acute bony abnormality. Specifically, no fracture, subluxation, or dislocation. Soft tissues are intact.  IMPRESSION: No acute bony abnormality.   Electronically Signed   By: Rolm Baptise M.D.   On: 04/15/2015 11:51     Assessment/Plan   ICD-9-CM ICD-10-CM   1. Urinary tract infection without hematuria, site unspecified - improving 599.0  N39.0   2. Essential hypertension - stable 401.9 I10   3. DM (diabetes mellitus) with complications with hyperglycemia 250.90 E11.8   4. Chronic diastolic CHF (congestive heart failure) - stable 428.32 I50.32    428.0    5. Physical deconditioning 799.3 R53.81   6. Gastroesophageal reflux disease, esophagitis presence not specified -stable 530.81 K21.9   7. Coronary artery disease involving native coronary artery of native heart with angina pectoris - stable 414.01 I25.119    413.9    8. OSA (obstructive sleep apnea) on CPAP 327.23 G47.33   9. Obesity- BMI 49 278.00 E66.9   10. Neurogenic bladder with chronic indwelling foley cath 596.54 N31.9   11.    Yeast dermatitis - right groin  --clotrimazole-betamethasone cream BID until rash gone. Keep area dry  --cont current meds as ordered  --check CBC w diff and BMP  --cont qAC and qhs CBGs  --foley cath care  --PT/OT/ST as indicated  --CPAP qhs as ordered  --GOAL: short term rehab and d/c home when medically appropriate. Communicated with pt and nursing.  --will follow  Aalaya Yadao S. Perlie Gold  [facility] [address] [place], [place] [address] [phone] Cell (Monday-Friday 8 AM - 5 PM) [phone] After 5 PM and follow prompts

## 2015-04-26 ENCOUNTER — Encounter: Payer: Self-pay | Admitting: Internal Medicine

## 2015-04-26 DIAGNOSIS — N319 Neuromuscular dysfunction of bladder, unspecified: Secondary | ICD-10-CM | POA: Insufficient documentation

## 2015-04-29 ENCOUNTER — Emergency Department (HOSPITAL_COMMUNITY): Payer: Medicare Other

## 2015-04-29 ENCOUNTER — Encounter (HOSPITAL_COMMUNITY): Payer: Self-pay | Admitting: Emergency Medicine

## 2015-04-29 ENCOUNTER — Inpatient Hospital Stay (HOSPITAL_COMMUNITY)
Admission: EM | Admit: 2015-04-29 | Discharge: 2015-05-06 | DRG: 287 | Disposition: A | Discharge status: Home / Self Care | Payer: Medicare Other | Attending: Cardiology | Admitting: Cardiology

## 2015-04-29 DIAGNOSIS — I200 Unstable angina: Secondary | ICD-10-CM | POA: Diagnosis present

## 2015-04-29 DIAGNOSIS — G8929 Other chronic pain: Secondary | ICD-10-CM | POA: Diagnosis present

## 2015-04-29 DIAGNOSIS — I69351 Hemiplegia and hemiparesis following cerebral infarction affecting right dominant side: Secondary | ICD-10-CM

## 2015-04-29 DIAGNOSIS — E1140 Type 2 diabetes mellitus with diabetic neuropathy, unspecified: Secondary | ICD-10-CM | POA: Diagnosis present

## 2015-04-29 DIAGNOSIS — Z96651 Presence of right artificial knee joint: Secondary | ICD-10-CM | POA: Diagnosis present

## 2015-04-29 DIAGNOSIS — I25119 Atherosclerotic heart disease of native coronary artery with unspecified angina pectoris: Secondary | ICD-10-CM | POA: Diagnosis not present

## 2015-04-29 DIAGNOSIS — I350 Nonrheumatic aortic (valve) stenosis: Secondary | ICD-10-CM | POA: Diagnosis present

## 2015-04-29 DIAGNOSIS — N319 Neuromuscular dysfunction of bladder, unspecified: Secondary | ICD-10-CM | POA: Diagnosis present

## 2015-04-29 DIAGNOSIS — Z87891 Personal history of nicotine dependence: Secondary | ICD-10-CM

## 2015-04-29 DIAGNOSIS — R5381 Other malaise: Secondary | ICD-10-CM | POA: Diagnosis present

## 2015-04-29 DIAGNOSIS — I10 Essential (primary) hypertension: Secondary | ICD-10-CM | POA: Diagnosis present

## 2015-04-29 DIAGNOSIS — Z23 Encounter for immunization: Secondary | ICD-10-CM

## 2015-04-29 DIAGNOSIS — I2583 Coronary atherosclerosis due to lipid rich plaque: Secondary | ICD-10-CM

## 2015-04-29 DIAGNOSIS — G4733 Obstructive sleep apnea (adult) (pediatric): Secondary | ICD-10-CM

## 2015-04-29 DIAGNOSIS — R569 Unspecified convulsions: Secondary | ICD-10-CM | POA: Diagnosis present

## 2015-04-29 DIAGNOSIS — R4189 Other symptoms and signs involving cognitive functions and awareness: Secondary | ICD-10-CM | POA: Diagnosis present

## 2015-04-29 DIAGNOSIS — R55 Syncope and collapse: Secondary | ICD-10-CM

## 2015-04-29 DIAGNOSIS — Z7982 Long term (current) use of aspirin: Secondary | ICD-10-CM

## 2015-04-29 DIAGNOSIS — R079 Chest pain, unspecified: Secondary | ICD-10-CM | POA: Diagnosis present

## 2015-04-29 DIAGNOSIS — Z6841 Body Mass Index (BMI) 40.0 and over, adult: Secondary | ICD-10-CM

## 2015-04-29 DIAGNOSIS — E118 Type 2 diabetes mellitus with unspecified complications: Secondary | ICD-10-CM

## 2015-04-29 DIAGNOSIS — Z960 Presence of urogenital implants: Secondary | ICD-10-CM

## 2015-04-29 DIAGNOSIS — I447 Left bundle-branch block, unspecified: Secondary | ICD-10-CM | POA: Diagnosis present

## 2015-04-29 DIAGNOSIS — R0789 Other chest pain: Secondary | ICD-10-CM

## 2015-04-29 DIAGNOSIS — I9751 Accidental puncture and laceration of a circulatory system organ or structure during a circulatory system procedure: Secondary | ICD-10-CM | POA: Diagnosis not present

## 2015-04-29 DIAGNOSIS — R404 Transient alteration of awareness: Secondary | ICD-10-CM | POA: Diagnosis present

## 2015-04-29 DIAGNOSIS — I441 Atrioventricular block, second degree: Secondary | ICD-10-CM | POA: Diagnosis present

## 2015-04-29 DIAGNOSIS — E0800 Diabetes mellitus due to underlying condition with hyperosmolarity without nonketotic hyperglycemic-hyperosmolar coma (NKHHC): Secondary | ICD-10-CM | POA: Diagnosis present

## 2015-04-29 DIAGNOSIS — R269 Unspecified abnormalities of gait and mobility: Secondary | ICD-10-CM

## 2015-04-29 DIAGNOSIS — I25110 Atherosclerotic heart disease of native coronary artery with unstable angina pectoris: Secondary | ICD-10-CM | POA: Diagnosis present

## 2015-04-29 DIAGNOSIS — M545 Low back pain: Secondary | ICD-10-CM | POA: Diagnosis present

## 2015-04-29 DIAGNOSIS — E785 Hyperlipidemia, unspecified: Secondary | ICD-10-CM | POA: Diagnosis present

## 2015-04-29 DIAGNOSIS — Y840 Cardiac catheterization as the cause of abnormal reaction of the patient, or of later complication, without mention of misadventure at the time of the procedure: Secondary | ICD-10-CM | POA: Diagnosis not present

## 2015-04-29 DIAGNOSIS — I2510 Atherosclerotic heart disease of native coronary artery without angina pectoris: Secondary | ICD-10-CM

## 2015-04-29 DIAGNOSIS — Z8673 Personal history of transient ischemic attack (TIA), and cerebral infarction without residual deficits: Secondary | ICD-10-CM

## 2015-04-29 DIAGNOSIS — R9439 Abnormal result of other cardiovascular function study: Secondary | ICD-10-CM | POA: Diagnosis not present

## 2015-04-29 DIAGNOSIS — Z978 Presence of other specified devices: Secondary | ICD-10-CM

## 2015-04-29 DIAGNOSIS — N320 Bladder-neck obstruction: Secondary | ICD-10-CM | POA: Diagnosis present

## 2015-04-29 DIAGNOSIS — Z794 Long term (current) use of insulin: Secondary | ICD-10-CM

## 2015-04-29 DIAGNOSIS — I255 Ischemic cardiomyopathy: Secondary | ICD-10-CM | POA: Diagnosis present

## 2015-04-29 DIAGNOSIS — Z8679 Personal history of other diseases of the circulatory system: Secondary | ICD-10-CM

## 2015-04-29 DIAGNOSIS — K219 Gastro-esophageal reflux disease without esophagitis: Secondary | ICD-10-CM | POA: Diagnosis present

## 2015-04-29 DIAGNOSIS — I5032 Chronic diastolic (congestive) heart failure: Secondary | ICD-10-CM | POA: Diagnosis not present

## 2015-04-29 LAB — COMPREHENSIVE METABOLIC PANEL
ALT: 14 U/L — ABNORMAL LOW (ref 17–63)
AST: 19 U/L (ref 15–41)
Albumin: 3 g/dL — ABNORMAL LOW (ref 3.5–5.0)
Alkaline Phosphatase: 75 U/L (ref 38–126)
Anion gap: 7 (ref 5–15)
BUN: 12 mg/dL (ref 6–20)
CHLORIDE: 101 mmol/L (ref 101–111)
CO2: 27 mmol/L (ref 22–32)
CREATININE: 0.72 mg/dL (ref 0.61–1.24)
Calcium: 9.1 mg/dL (ref 8.9–10.3)
GFR calc Af Amer: >60 mL/min (ref >60)
GFR calc non Af Amer: >60 mL/min (ref >60)
Glucose, Bld: 352 mg/dL — ABNORMAL HIGH (ref 65–99)
Potassium: 4.6 mmol/L (ref 3.5–5.1)
SODIUM: 135 mmol/L (ref 135–145)
Total Bilirubin: 0.4 mg/dL (ref 0.3–1.2)
Total Protein: 6.6 g/dL (ref 6.5–8.1)

## 2015-04-29 LAB — CBC WITH DIFFERENTIAL/PLATELET
BASOS ABS: 0.1 10*3/uL (ref 0.0–0.1)
Basophils Relative: 2 % — ABNORMAL HIGH (ref 0–1)
EOS ABS: 0.6 10*3/uL (ref 0.0–0.7)
EOS PCT: 9 % — AB (ref 0–5)
HCT: 42.6 % (ref 39.0–52.0)
Hemoglobin: 14.6 g/dL (ref 13.0–17.0)
Lymphocytes Relative: 23 % (ref 12–46)
Lymphs Abs: 1.6 10*3/uL (ref 0.7–4.0)
MCH: 29.9 pg (ref 26.0–34.0)
MCHC: 34.3 g/dL (ref 30.0–36.0)
MCV: 87.1 fL (ref 78.0–100.0)
Monocytes Absolute: 0.5 10*3/uL (ref 0.1–1.0)
Monocytes Relative: 7 % (ref 3–12)
Neutro Abs: 4.3 10*3/uL (ref 1.7–7.7)
Neutrophils Relative %: 59 % (ref 43–77)
PLATELETS: 197 10*3/uL (ref 150–400)
RBC: 4.89 MIL/uL (ref 4.22–5.81)
RDW: 13.3 % (ref 11.5–15.5)
WBC: 7.2 10*3/uL (ref 4.0–10.5)

## 2015-04-29 LAB — CBG MONITORING, ED: GLUCOSE-CAPILLARY: 320 mg/dL — AB (ref 65–99)

## 2015-04-29 LAB — I-STAT TROPONIN, ED: Troponin i, poc: 0 ng/mL (ref 0.00–0.08)

## 2015-04-29 MED ORDER — SODIUM CHLORIDE 0.9 % IV BOLUS (SEPSIS)
1000.0000 mL | Freq: Once | INTRAVENOUS | Status: AC
  Administered 2015-04-29: 1000 mL via INTRAVENOUS

## 2015-04-29 MED ORDER — NITROGLYCERIN 0.4 MG SL SUBL
0.4000 mg | SUBLINGUAL_TABLET | SUBLINGUAL | Status: DC | PRN
  Administered 2015-04-29: 0.4 mg via SUBLINGUAL

## 2015-04-29 NOTE — ED Notes (Signed)
NP at bedside.

## 2015-04-29 NOTE — ED Provider Notes (Signed)
CSN: [id number]     Arrival date & time 04/29/15  1642 History   First MD Initiated Contact with Patient 04/29/15 1652     Chief Complaint  Patient presents with  . Chest Pain     (Consider location/radiation/quality/duration/timing/severity/associated sxs/prior Treatment) Patient is a 69 y.o. male presenting with chest pain. The history is provided by the patient. No language interpreter was used.  Chest Pain Pain location:  Substernal area Pain quality: pressure   Pain radiates to:  L arm Pain radiates to the back: no   Pain severity:  Moderate Onset quality:  Sudden Duration:  15 hours Timing:  Intermittent Context: at rest   Ineffective treatments:  Nitroglycerin Risk factors: coronary artery disease, diabetes mellitus, hypertension and obesity     Past Medical History  Diagnosis Date  . Obese   . Hypertension   . Diabetic neuropathy, painful   . Chronic indwelling Foley catheter   . Bladder spasms   . Hyperlipidemia   . GERD (gastroesophageal reflux disease)   . Neurogenic bladder   . CAD (coronary artery disease)   . PONV (postoperative nausea and vomiting)   . CHF (congestive heart failure)   . DVT (deep venous thrombosis) 1980's    LLE  . Type II diabetes mellitus   . Diabetic diarrhea   . Pneumonia 1999  . OSA (obstructive sleep apnea)     "they wanted me to wear a mask; I couldn't" (11/10/2014)  . Arthritis     "hands, ankles, knees" (11/10/2014)  . Chronic lower back pain   . Stroke 10/2011; 06/2014    right hand numbness/notes 10/20/2011, pt not sure he had stroke in 2013; "right hand weaker and right face not quite right since" (11/10/2014)   Past Surgical History  Procedure Laterality Date  . Gastroplasty    . Total knee arthroplasty Right 1990's  . Left heart catheterization with coronary angiogram N/A 10/24/2011    Procedure: LEFT HEART CATHETERIZATION WITH CORONARY ANGIOGRAM;  Surgeon: Candee Furbish, MD;  Location: [facility] CATH LAB;  Service: Cardiovascular;   Laterality: N/A;  right radial artery approach  . Cholecystectomy open  1970's?  . Joint replacement    . Carpal tunnel release Bilateral ~ 2003-2004   Family History  Problem Relation Age of Onset  . Hypertension Mother   . Diabetes type I Father   . Cancer Brother      Testicular Cancer  . Cancer Brother     Leukemia   Social History  Substance Use Topics  . Smoking status: Former Smoker -- 2.00 packs/day for 20 years    Types: Cigarettes    Quit date: 09/10/1976  . Smokeless tobacco: Never Used  . 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   Cardiovascular: Positive for chest pain.  All other systems reviewed and are negative.     Allergies  Ace inhibitors; Lipitor; Metformin and related; Cefadroxil; Cephalexin; Morphine and related; and Robaxin  Home Medications   Prior to Admission medications   Medication Sig Start Date End Date Taking? Authorizing Provider  acetaminophen (TYLENOL) 325 MG tablet Take 2 tablets (650 mg total) by mouth every 4 (four) hours as needed for headache or mild pain. 12/08/14   Erlene Quan, PA-C  aspirin EC 81 MG tablet Take 81 mg by mouth every morning.     Historical Provider, MD  calcium carbonate (TUMS -  DOSED IN MG ELEMENTAL CALCIUM) 500 MG chewable tablet Chew 1 tablet (200 mg of elemental calcium total) by mouth 3 (three) times daily as needed for indigestion or heartburn. 11/19/14   Debbe Odea, MD  diphenoxylate-atropine (LOMOTIL) 2.5-0.025 MG per tablet Take 1 tablet by mouth 3 (three) times daily as needed. For diarrhea 09/06/14   Tiffany L Reed, DO  fexofenadine (ALLEGRA) 180 MG tablet Take 180 mg by mouth at bedtime as needed for allergies.     Historical Provider, MD  furosemide (LASIX) 40 MG tablet Take 1 tablet (40 mg total) by mouth daily. 04/03/14   Lavone Orn, MD  HYDROcodone-acetaminophen (NORCO/VICODIN) 5-325 MG per  tablet Take 1 tablet by mouth every 6 (six) hours as needed for severe pain. 04/18/15   Kelvin Cellar, MD  insulin NPH Human (HUMULIN N) 100 UNIT/ML injection Inject 0.2 mLs (20 Units total) into the skin 2 (two) times daily at 8 am and 10 pm. 04/18/15   Kelvin Cellar, MD  lisinopril (PRINIVIL,ZESTRIL) 5 MG tablet TAKE 1 TABLET (5 MG TOTAL) BY MOUTH DAILY. 01/24/15   Jerline Pain, MD  metoprolol (LOPRESSOR) 50 MG tablet Take 1 tablet (50 mg total) by mouth 2 (two) times daily. 12/08/14   Erlene Quan, PA-C  nitrofurantoin, macrocrystal-monohydrate, (MACROBID) 100 MG capsule Take 1 capsule (100 mg total) by mouth 2 (two) times daily. 04/18/15   Kelvin Cellar, MD  nitroGLYCERIN (NITROSTAT) 0.4 MG SL tablet Place 1 tablet (0.4 mg total) under the tongue every 5 (five) minutes as needed for chest pain. 12/08/14   Erlene Quan, PA-C  omeprazole (PRILOSEC) 20 MG capsule Take 20 mg by mouth daily.    Historical Provider, MD  oxybutynin (DITROPAN) 5 MG tablet Take 5 mg by mouth 3 (three) times daily as needed for bladder spasms.    Historical Provider, MD  Sennosides 25 MG TABS Take 25 mg by mouth daily as needed (for constipation).    Historical Provider, MD   BP 135/68 mmHg  Pulse 80  Temp(Src) 98.4 F (36.9 C) (Oral)  Resp 18  Ht 6\' 1"  (1.854 m)  Wt 328 lb (148.78 kg)  BMI 43.28 kg/m2  SpO2 98% Physical Exam  Constitutional: He is oriented to person, place, and time. He appears well-developed and well-nourished.  HENT:  Head: Normocephalic.  Eyes: Conjunctivae are normal.  Neck: Neck supple.  Cardiovascular: Normal rate and regular rhythm.   Pulmonary/Chest: Effort normal and breath sounds normal.  Abdominal: Soft.  Musculoskeletal: He exhibits edema. He exhibits no tenderness.  Neurological: He is alert and oriented to person, place, and time.  Skin: Skin is warm and dry.  Psychiatric: He has a normal mood and affect.  Nursing note and vitals reviewed.   ED Course  Procedures  (including critical care time) Labs Review Labs Reviewed  CBC WITH DIFFERENTIAL/PLATELET - Abnormal; Notable for the following:    Eosinophils Relative 9 (*)    Basophils Relative 2 (*)    All other components within normal limits  COMPREHENSIVE METABOLIC PANEL - Abnormal; Notable for the following:    Glucose, Bld 352 (*)    Albumin 3.0 (*)    ALT 14 (*)    All other components within normal limits  CBG MONITORING, ED - Abnormal; Notable for the following:    Glucose-Capillary 320 (*)    All other components within normal limits  Randolm Idol, ED    Imaging Review Dg Chest 2 View  04/29/2015   CLINICAL DATA:  Chest pain. Symptoms for 2 days. Initial encounter.  EXAM: CHEST  2 VIEW  COMPARISON:  12/22/2014.  FINDINGS: Cardiopericardial silhouette within normal limits. Mediastinal contours normal. Trachea midline. No airspace disease or effusion. Monitoring leads project over the chest.  IMPRESSION: No active cardiopulmonary disease.   Electronically Signed   By: Dereck Ligas M.D.   On: 04/29/2015 17:55   .   EKG Interpretation   Date/Time:  Friday April 29 2015 16:46:50 EDT Ventricular Rate:  83 PR Interval:  100 QRS Duration: 121 QT Interval:  398 QTC Calculation: 468 R Axis:   -58 Text Interpretation:  Sinus rhythm Short PR interval Left bundle branch  block Baseline wander in lead(s) V6 Confirmed by FLOYD MD, DANIEL [id number])  on 04/29/2015 5:09:29 PM      Patient with midsternal chest pain radiating to left arm onset around 0200 today.  Patient given nitroglycerin and possibly norco at rehab with improvement in pain.  Pain returned upon awakening and has been persistent throughout the day.  No change in pain with NTG in ED.  ECG without ischemic changes.   Patient with history of HTN, diastolic HF, RCA disease, DM, obesity, and obstructive sleep apnea.  Lab and radiology results reviewed.  Negative troponin. CBG of 320 upon arrival. No anion gap.  Given a liter of  fluid.  Discussed with cardiology--will consult.  Admission requested from hospitalist service.   MDM   Final diagnoses:  None    Chest pain.    Etta Quill, NP 04/30/15 [place], DO 04/30/15 1358

## 2015-04-29 NOTE — ED Provider Notes (Signed)
Medical screening examination/treatment/procedure(s) were conducted as a shared visit with non-physician practitioner(s) and myself.  I personally evaluated the patient during the encounter.   EKG Interpretation   Date/Time:  Friday April 29 2015 16:46:50 EDT Ventricular Rate:  83 PR Interval:  100 QRS Duration: 121 QT Interval:  398 QTC Calculation: 468 R Axis:   -58 Text Interpretation:  Sinus rhythm Short PR interval Left bundle branch  block Baseline wander in lead(s) V6 Confirmed by Story Conti MD, Quillian Quince [id number])  on 04/29/2015 5:09:29 PM       See the written copy of this report in the patient's paper medical record.  These results did not interface directly into the electronic medical record and are summarized here.  69 yo M with a chief complaint of chest pain. Woke him up in the morning. Pain progressing throughout the day. A dull pressure that radiates to his left shoulder. Thinks it may feel like his prior coronary events. Patient not sure what makes it better or worse.  On exam morbidly obese mild epigastric tenderness pain not reproducible with palpation of the chest. Lungs clear to auscultation bilaterally. Abdomen soft nontender nondistended. Chronic 3+ lower extremity edema bilaterally.   Patient with multiple risk factors including a history of coronary disease. Chest pain sounds typical to anginal pain. Initial troponin negative. Possibly unstable angina will admit for cardiac rule out.  [name], DO 04/29/15 2244

## 2015-04-29 NOTE — H&P (Signed)
[facility] History and Physical  [name] [id number] DOB: 1945/09/20 DOA: 04/29/2015  Referring physician: ED physician PCP: [name], [name]   Chief Complaint: chest pain at rest  HPI:  [name] is a 69yo man with PMH of HTN, CAD, DM2 with neuropathy, CHF, h/o CVA, h/o DVT X 3 who presented for chest pain which awoke him from sleep on 8/19 at 2am.  He notes that the pain was a heavy feeling in the center of his chest, similar to previous angina, and it radiated down his left arm.  8-9/10 on the pain scale.  When I saw him the pain had resolved, but he reported that it did not get better with nitroglycerin as it had before.  Since 2am it had been coming and going .  He further has noticed in the past couple of days that his blood sugar has been staying in the 300s despite him skipping meals to try and get it lower.  Pain is not worsened by eating and does not appear to change with motion or activity.  He was admitted for a recent UTI and his symptoms from this has resolved.  Further associated symptoms included diaphoresis, nausea and diarrhea X 1 yesterday without SOB.  He reports swelling in only one leg, but his swelling appears to be symmetrical on my exam.   In the ED he had an EKG which was unchanged.  Negative troponin and medicine was called for admission.    Assessment and Plan:  Chest pain at rest with h/o CAD - Admit to obs for chest pain rule out - Troponin X 3, first negative - Repeat EKG - Telemetry - CXR showed no active cardiopulmonary disease - Trial of GI cocktail - Consider cardiology in the AM for possible stress testing.  - Last stress test noted in chart in 2015, images present but I did not see a report - LHC in 2013 apparently showed RCA disease - On daily aspirin - Nitro PRN for pain  DM (diabetes mellitus) with Diabetic neuropathy - Check A1C - Continue insulin NPH 20 units BID, half while NPO - SSI    Hypertension - Patient is  not clear what he is taking, will need to confirm with ALF about doses - Hold once a day clonidine - Continue lasix, lisinopril, metoprolol  HLD - Check lipid panel    Physical deconditioning - Currently at an assisted living facility to help with strengthening.   OSA - CPAP  Neurogenic bladder with chronic indwelling foley - Continue foley and oxybutynin for bladder spasm.    Radiological Exams on Admission: Dg Chest 2 View  04/29/2015   CLINICAL DATA:  Chest pain. Symptoms for 2 days. Initial encounter.  EXAM: CHEST  2 VIEW  COMPARISON:  12/22/2014.  FINDINGS: Cardiopericardial silhouette within normal limits. Mediastinal contours normal. Trachea midline. No airspace disease or effusion. Monitoring leads project over the chest.  IMPRESSION: No active cardiopulmonary disease.   Electronically Signed   By: [name] M.D.   On: 04/29/2015 17:55   Code Status: Full Family Communication: Pt at bedside Disposition Plan: Admit for further evaluation    [name], [name] [phone]   Review of Systems:  Constitutional:  +for diaphoresis Negative for fever, chills and malaise/fatigue.  HENT: Negative for hearing loss, ear pain Eyes: + for blurred vision. Negative for double vision, photophobia Respiratory: Negative for cough, hemoptysis, sputum production, shortness of breath Cardiovascular: + for chest pain, leg swelling Negative for palpitations, orthopnea,  claudication  Gastrointestinal: + for nausea, diarrhea. Negative for vomiting and abdominal pain. Negative for heartburn, constipation, blood in stool and melena.  Genitourinary:  Negative for dysuria, urgency, frequency, hematuria and flank pain.  Musculoskeletal: Negative for myalgias, back pain, joint pain and falls.  Skin: Negative for itching and rash.  Neurological: Negative for dizziness and weakness.  Endo/Heme/Allergies: Negative for environmental allergies and polydipsia. Does not bruise/bleed easily.     Past  Medical History  Diagnosis Date  . Obese   . Hypertension   . Diabetic neuropathy, painful   . Chronic indwelling Foley catheter   . Bladder spasms   . Hyperlipidemia   . GERD (gastroesophageal reflux disease)   . Neurogenic bladder   . CAD (coronary artery disease)   . PONV (postoperative nausea and vomiting)   . CHF (congestive heart failure)   . DVT (deep venous thrombosis) 1980's    LLE  . Type II diabetes mellitus   . Diabetic diarrhea   . Pneumonia 1999  . OSA (obstructive sleep apnea)     "they wanted me to wear a mask; I couldn't" (11/10/2014)  . Arthritis     "hands, ankles, knees" (11/10/2014)  . Chronic lower back pain   . Stroke 10/2011; 06/2014    right hand numbness/notes 10/20/2011, pt not sure he had stroke in 2013; "right hand weaker and right face not quite right since" (11/10/2014)    Past Surgical History  Procedure Laterality Date  . Gastroplasty    . Total knee arthroplasty Right 1990's  . Left heart catheterization with coronary angiogram N/A 10/24/2011    Procedure: LEFT HEART CATHETERIZATION WITH CORONARY ANGIOGRAM;  Surgeon: [name], [name];  Location: [facility] CATH LAB;  Service: Cardiovascular;  Laterality: N/A;  right radial artery approach  . Cholecystectomy open  1970's?  . Joint replacement    . Carpal tunnel release Bilateral ~ 2003-2004    Social History:  reports that he quit smoking about 38 years ago. His smoking use included Cigarettes. He has a 40 pack-year smoking history. He has never used smokeless tobacco. He reports that he does not drink alcohol or use illicit drugs.  Allergies  Allergen Reactions  . Ace Inhibitors Swelling    Pt tolerates lisinopril  . Lipitor [Atorvastatin Calcium] Swelling  . Metformin And Related Swelling  . Cefadroxil Hives  . Cephalexin Hives  . Morphine And Related Other (See Comments)    Sweating, feels like is "in rocky boat."  . Robaxin [Methocarbamol] Other (See Comments)    Feels like he is shaky    Family  History  Problem Relation Age of Onset  . Hypertension Mother   . Diabetes type I Father   . Cancer Brother      Testicular Cancer  . Cancer Brother     Leukemia    Prior to Admission medications   Medication Sig Start Date End Date Taking? Authorizing Provider  acetaminophen (TYLENOL) 325 MG tablet Take 2 tablets (650 mg total) by mouth every 4 (four) hours as needed for headache or mild pain. 12/08/14  Yes Erlene Quan, PA-C  aspirin EC 81 MG tablet Take 81 mg by mouth every morning.    Yes Historical Provider, [name]  calcium carbonate (TUMS - DOSED IN MG ELEMENTAL CALCIUM) 500 MG chewable tablet Chew 1 tablet (200 mg of elemental calcium total) by mouth 3 (three) times daily as needed for indigestion or heartburn. 11/19/14  Yes Debbe Odea, [name]  cloNIDine (CATAPRES) 0.1 MG tablet  Take 0.1 mg by mouth once.   Yes Historical Provider, [name]  diphenoxylate-atropine (LOMOTIL) 2.5-0.025 MG per tablet Take 1 tablet by mouth 3 (three) times daily as needed. For diarrhea 09/06/14  Yes Tiffany L Reed, DO  fexofenadine (ALLEGRA) 180 MG tablet Take 180 mg by mouth daily as needed for allergies or rhinitis.   Yes Historical Provider, [name]  furosemide (LASIX) 40 MG tablet Take 1 tablet (40 mg total) by mouth daily. 04/03/14  Yes Lavone Orn, [name]  HYDROcodone-acetaminophen (NORCO/VICODIN) 5-325 MG per tablet Take 1 tablet by mouth every 6 (six) hours as needed for severe pain. 04/18/15  Yes Kelvin Cellar, [name]  insulin NPH Human (HUMULIN N) 100 UNIT/ML injection Inject 0.2 mLs (20 Units total) into the skin 2 (two) times daily at 8 am and 10 pm. 04/18/15  Yes Kelvin Cellar, [name]  lisinopril (PRINIVIL,ZESTRIL) 5 MG tablet TAKE 1 TABLET (5 MG TOTAL) BY MOUTH DAILY. 01/24/15  Yes Jerline Pain, [name]  metoprolol (LOPRESSOR) 50 MG tablet Take 1 tablet (50 mg total) by mouth 2 (two) times daily. 12/08/14  Yes Luke K Kilroy, PA-C  nitroGLYCERIN (NITROSTAT) 0.4 MG SL tablet Place 1 tablet (0.4 mg total) under the tongue every 5  (five) minutes as needed for chest pain. 12/08/14  Yes Luke K Kilroy, PA-C  NON FORMULARY Inhale 1 application into the lungs at bedtime. CPAP   Yes Historical Provider, [name]  omeprazole (PRILOSEC) 20 MG capsule Take 20 mg by mouth daily.   Yes Historical Provider, [name]  oxybutynin (DITROPAN) 5 MG tablet Take 5 mg by mouth daily as needed for bladder spasms.   Yes Historical Provider, [name]  Sennosides 25 MG TABS Take 25 mg by mouth daily as needed (for constipation).   Yes Historical Provider, [name]  nitrofurantoin, macrocrystal-monohydrate, (MACROBID) 100 MG capsule Take 1 capsule (100 mg total) by mouth 2 (two) times daily. Patient not taking: Reported on 04/29/2015 04/18/15   Kelvin Cellar, [name]    Physical Exam: Filed Vitals:   04/29/15 2217 04/29/15 2243 04/30/15 0055 04/30/15 0150  BP:  133/50 130/50 155/65  Pulse: 73 80 71 80  Temp:   98.1 F (36.7 C) 97.9 F (36.6 C)  TempSrc:   Oral Oral  Resp: 22 20 15 16   Height:    6\' 1"  (1.854 m)  Weight:    344 lb 2.2 oz (156.1 kg)  SpO2: 96% 98% 96% 98%    Physical Exam  Constitutional: Appears chronically ill.  HENT: Normocephalic.Oropharynx is clear and moist.  Eyes: Conjunctivae and EOM are normal.  no scleral icterus.  Neck: Normal ROM. Neck supple. No JVD.  CVS: RR, NR, S1/S2 +, no murmurs Pulmonary: Effort and breath sounds normal, wheezes, rales.  Abdominal: Soft. BS +,  no distension, tenderness Musculoskeletal: + chronic changes from LE edema, + trace pitting edema to ankles.  Appeared symmetrical Neuro: Alert. Normal muscle. No cranial nerve deficit. Skin: Skin is warm and dry.  Psychiatric: Distant affect  Labs on Admission:  Basic Metabolic Panel:  Recent Labs Lab 04/29/15 1803 04/30/15 0238  NA 135  --   K 4.6  --   CL 101  --   CO2 27  --   GLUCOSE 352*  --   BUN 12  --   CREATININE 0.72 0.75  CALCIUM 9.1  --    Liver Function Tests:  Recent Labs Lab 04/29/15 1803  AST 19  ALT 14*  ALKPHOS 75  BILITOT 0.4   PROT 6.6  ALBUMIN 3.0*   CBC:  Recent Labs Lab 04/29/15 1803 04/30/15 0238  WBC 7.2 7.2  NEUTROABS 4.3  --   HGB 14.6 14.3  HCT 42.6 42.6  MCV 87.1 87.8  PLT 197 189   Cardiac Enzymes:  Recent Labs Lab 04/30/15 0238  TROPONINI <0.03   CBG:  Recent Labs Lab 04/29/15 1651 04/30/15 0214  GLUCAP 320* 251*    EKG: Appears unchanged from earlier this month.  Prolonged QRS and pr prolongation.    If 7PM-7AM, please contact night-coverage [email or web address] Password [email or web address] 04/30/2015, 5:05 AM

## 2015-04-29 NOTE — ED Notes (Signed)
Per EMS - pt comes from [facility] c/o intermittent substernal CP with radiation to L arm since last night.  Pt denies SOB, N/V, dizziness.  Pt at [facility] for post-rehab of UTI.  Nursing facility staff gave pt 1 nitro last night - no relief, pt states nothing made pain worse nor better.  Pt was given clonidine by staff this morning - pt states that did help.  HX HTN, DM, CBG 303.  EMS VS: 149/100, 87 NSR, RR 18.  Pt uses walker and was up and moving today.

## 2015-04-30 ENCOUNTER — Encounter (HOSPITAL_COMMUNITY): Payer: Self-pay | Admitting: Internal Medicine

## 2015-04-30 ENCOUNTER — Observation Stay (HOSPITAL_COMMUNITY): Payer: Medicare Other

## 2015-04-30 ENCOUNTER — Telehealth: Payer: Self-pay | Admitting: Physician Assistant

## 2015-04-30 DIAGNOSIS — R5381 Other malaise: Secondary | ICD-10-CM | POA: Diagnosis not present

## 2015-04-30 DIAGNOSIS — E785 Hyperlipidemia, unspecified: Secondary | ICD-10-CM | POA: Diagnosis not present

## 2015-04-30 DIAGNOSIS — I2510 Atherosclerotic heart disease of native coronary artery without angina pectoris: Secondary | ICD-10-CM

## 2015-04-30 DIAGNOSIS — R079 Chest pain, unspecified: Secondary | ICD-10-CM

## 2015-04-30 DIAGNOSIS — E118 Type 2 diabetes mellitus with unspecified complications: Secondary | ICD-10-CM | POA: Diagnosis not present

## 2015-04-30 DIAGNOSIS — I10 Essential (primary) hypertension: Secondary | ICD-10-CM | POA: Diagnosis not present

## 2015-04-30 LAB — CBC
HEMATOCRIT: 42.6 % (ref 39.0–52.0)
HEMOGLOBIN: 14.3 g/dL (ref 13.0–17.0)
MCH: 29.5 pg (ref 26.0–34.0)
MCHC: 33.6 g/dL (ref 30.0–36.0)
MCV: 87.8 fL (ref 78.0–100.0)
Platelets: 189 10*3/uL (ref 150–400)
RBC: 4.85 MIL/uL (ref 4.22–5.81)
RDW: 13.5 % (ref 11.5–15.5)
WBC: 7.2 10*3/uL (ref 4.0–10.5)

## 2015-04-30 LAB — LIPID PANEL
Cholesterol: 163 mg/dL (ref 0–200)
HDL: 30 mg/dL — AB (ref >40)
LDL CALC: 106 mg/dL — AB (ref 0–99)
Total CHOL/HDL Ratio: 5.4 RATIO
Triglycerides: 133 mg/dL (ref <150)
VLDL: 27 mg/dL (ref 0–40)

## 2015-04-30 LAB — TROPONIN I
Troponin I: <0.03 ng/mL (ref <0.031)
Troponin I: <0.03 ng/mL (ref <0.031)

## 2015-04-30 LAB — GLUCOSE, CAPILLARY
GLUCOSE-CAPILLARY: 251 mg/dL — AB (ref 65–99)
GLUCOSE-CAPILLARY: 248 mg/dL — AB (ref 65–99)
Glucose-Capillary: 206 mg/dL — ABNORMAL HIGH (ref 65–99)
Glucose-Capillary: 334 mg/dL — ABNORMAL HIGH (ref 65–99)

## 2015-04-30 LAB — CREATININE, SERUM: Creatinine, Ser: 0.75 mg/dL (ref 0.61–1.24)

## 2015-04-30 LAB — MRSA PCR SCREENING: MRSA by PCR: POSITIVE — AB

## 2015-04-30 MED ORDER — HYDROCODONE-ACETAMINOPHEN 5-325 MG PO TABS
1.0000 | ORAL_TABLET | Freq: Four times a day (QID) | ORAL | Status: DC | PRN
  Administered 2015-04-30 – 2015-05-06 (×13): 1 via ORAL
  Filled 2015-04-30 (×13): qty 1

## 2015-04-30 MED ORDER — INSULIN NPH (HUMAN) (ISOPHANE) 100 UNIT/ML ~~LOC~~ SUSP
20.0000 [IU] | Freq: Two times a day (BID) | SUBCUTANEOUS | Status: DC
  Administered 2015-04-30 – 2015-05-06 (×10): 20 [IU] via SUBCUTANEOUS
  Filled 2015-04-30 (×5): qty 10

## 2015-04-30 MED ORDER — METOPROLOL TARTRATE 50 MG PO TABS
50.0000 mg | ORAL_TABLET | Freq: Two times a day (BID) | ORAL | Status: DC
  Administered 2015-04-30 – 2015-05-01 (×4): 50 mg via ORAL
  Filled 2015-04-30 (×4): qty 1

## 2015-04-30 MED ORDER — FUROSEMIDE 40 MG PO TABS
40.0000 mg | ORAL_TABLET | Freq: Every day | ORAL | Status: DC
  Administered 2015-04-30 – 2015-05-06 (×6): 40 mg via ORAL
  Filled 2015-04-30 (×7): qty 1

## 2015-04-30 MED ORDER — LORATADINE 10 MG PO TABS
10.0000 mg | ORAL_TABLET | Freq: Every day | ORAL | Status: DC
  Administered 2015-04-30 – 2015-05-06 (×7): 10 mg via ORAL
  Filled 2015-04-30 (×8): qty 1

## 2015-04-30 MED ORDER — SENNA 8.6 MG PO TABS: 8.6000 mg | ORAL_TABLET | Freq: Every day | ORAL | Status: DC | PRN

## 2015-04-30 MED ORDER — ENOXAPARIN SODIUM 80 MG/0.8ML ~~LOC~~ SOLN
80.0000 mg | Freq: Every day | SUBCUTANEOUS | Status: DC
  Administered 2015-04-30 – 2015-05-06 (×6): 80 mg via SUBCUTANEOUS
  Filled 2015-04-30 (×6): qty 0.8

## 2015-04-30 MED ORDER — ACETAMINOPHEN 325 MG PO TABS
650.0000 mg | ORAL_TABLET | ORAL | Status: DC | PRN
Start: 2015-04-30 — End: 2015-05-06
  Filled 2015-04-30: qty 2

## 2015-04-30 MED ORDER — REGADENOSON 0.4 MG/5ML IV SOLN
0.4000 mg | Freq: Once | INTRAVENOUS | Status: DC
  Filled 2015-04-30: qty 5

## 2015-04-30 MED ORDER — ONDANSETRON HCL 4 MG/2ML IJ SOLN
4.0000 mg | Freq: Four times a day (QID) | INTRAMUSCULAR | Status: DC | PRN
  Administered 2015-05-04: 4 mg via INTRAVENOUS
  Filled 2015-04-30 (×2): qty 2

## 2015-04-30 MED ORDER — INSULIN ASPART 100 UNIT/ML ~~LOC~~ SOLN
0.0000 [IU] | Freq: Three times a day (TID) | SUBCUTANEOUS | Status: DC
  Administered 2015-04-30: 11 [IU] via SUBCUTANEOUS
  Administered 2015-05-01: 8 [IU] via SUBCUTANEOUS
  Administered 2015-05-01: 2 [IU] via SUBCUTANEOUS
  Administered 2015-05-02: 3 [IU] via SUBCUTANEOUS
  Administered 2015-05-02 – 2015-05-04 (×4): 2 [IU] via SUBCUTANEOUS
  Administered 2015-05-04: 3 [IU] via SUBCUTANEOUS

## 2015-04-30 MED ORDER — PANTOPRAZOLE SODIUM 40 MG PO TBEC
40.0000 mg | DELAYED_RELEASE_TABLET | Freq: Every day | ORAL | Status: DC
  Administered 2015-05-01 – 2015-05-06 (×6): 40 mg via ORAL
  Filled 2015-04-30 (×6): qty 1

## 2015-04-30 MED ORDER — INSULIN ASPART 100 UNIT/ML ~~LOC~~ SOLN
0.0000 [IU] | Freq: Every day | SUBCUTANEOUS | Status: DC
  Administered 2015-04-30: 3 [IU] via SUBCUTANEOUS

## 2015-04-30 MED ORDER — GI COCKTAIL ~~LOC~~
30.0000 mL | Freq: Two times a day (BID) | ORAL | Status: DC | PRN
  Administered 2015-04-30: 30 mL via ORAL
  Filled 2015-04-30: qty 30

## 2015-04-30 MED ORDER — EZETIMIBE 10 MG PO TABS
10.0000 mg | ORAL_TABLET | Freq: Every day | ORAL | Status: DC
  Administered 2015-04-30 – 2015-05-06 (×7): 10 mg via ORAL
  Filled 2015-04-30 (×8): qty 1

## 2015-04-30 MED ORDER — MORPHINE SULFATE (PF) 2 MG/ML IV SOLN: 2.0000 mg | INTRAVENOUS | Status: DC | PRN

## 2015-04-30 MED ORDER — LISINOPRIL 5 MG PO TABS
5.0000 mg | ORAL_TABLET | Freq: Every day | ORAL | Status: DC
  Administered 2015-04-30 – 2015-05-03 (×4): 5 mg via ORAL
  Filled 2015-04-30 (×6): qty 1

## 2015-04-30 MED ORDER — GI COCKTAIL ~~LOC~~: 30.0000 mL | Freq: Four times a day (QID) | ORAL | Status: DC | PRN

## 2015-04-30 MED ORDER — ASPIRIN EC 81 MG PO TBEC
81.0000 mg | DELAYED_RELEASE_TABLET | Freq: Every morning | ORAL | Status: DC
  Administered 2015-04-30 – 2015-05-06 (×6): 81 mg via ORAL
  Filled 2015-04-30 (×8): qty 1

## 2015-04-30 MED ORDER — OXYBUTYNIN CHLORIDE 5 MG PO TABS
5.0000 mg | ORAL_TABLET | Freq: Every day | ORAL | Status: DC | PRN
  Filled 2015-04-30: qty 1

## 2015-04-30 MED ORDER — REGADENOSON 0.4 MG/5ML IV SOLN
INTRAVENOUS | Status: AC
  Administered 2015-04-30: 0.4 mg
  Filled 2015-04-30: qty 5

## 2015-04-30 NOTE — Progress Notes (Signed)
Rt Note: Checked with pt about CPAP order Pt states he tried it about 3 years ago and didn't tolerate it. He states he has never had a sleep study and hasn't heard anything about it since. I told him if he changes his mind and wants to try to give [place] a call

## 2015-04-30 NOTE — Progress Notes (Signed)
Patient received from ED at 0150 via stretcher. Patient alert, oriented, verbal. Vital signs stable. Patient denies any pain at this time. Will cont. To monitor.

## 2015-04-30 NOTE — Progress Notes (Signed)
[facility] PROGRESS NOTE  Assessment/Plan: Chest pain at rest - Cardiac markers negative 3, with no events on telemetry. - Compared to previous EKG is unchanged sinus rhythm with a left bundle branch block. - Cardiology evaluation pending. - Continue aspirin and metoprolol  Physical deconditioning Pt eval pending  DM (diabetes mellitus) with complications - Serum glucose was high on admission was started on insulin NPH plus sliding scale and he continues to improve. - She is currently nothing by mouth for possible stress test. - Hemoglobin A1c is pending. NPO for possible stress test.  Essential Hypertension: - Continue the Lisinopril, Lasix and metoprolol - Blood pressure is at goal.    Code Status: Full Family Communication: Pt at bedside Disposition Plan: Admit for further evaluation    Consultants:  cardiology  Procedures:  CXR    Antibiotics:  None  HPI/Subjective: He relates he had episode of chest pain in house.  Objective: Filed Vitals:   04/29/15 2243 04/30/15 0055 04/30/15 0150 04/30/15 0633  BP: 133/50 130/50 155/65 128/54  Pulse: 80 71 80 55  Temp:  98.1 F (36.7 C) 97.9 F (36.6 C) 98 F (36.7 C)  TempSrc:  Oral Oral Oral  Resp: 20 15 16 18   Height:   6\' 1"  (1.854 m)   Weight:   156.1 kg (344 lb 2.2 oz)   SpO2: 98% 96% 98% 98%   No intake or output data in the 24 hours ending 04/30/15 1028 Filed Weights   04/29/15 1649 04/30/15 0150  Weight: 148.78 kg (328 lb) 156.1 kg (344 lb 2.2 oz)    Exam:  General: Alert, awake, oriented x3, in no acute distress.  HEENT: No bruits, no goiter.  Heart: Regular rate and rhythm. Lungs: Good air movement, clear Abdomen: Soft, nontender, nondistended, positive bowel sounds.  Neuro: Grossly intact, nonfocal.   Data Reviewed: Basic Metabolic Panel:  Recent Labs Lab 04/29/15 1803 04/30/15 0238  NA 135  --   K 4.6  --   CL 101  --   CO2 27  --   GLUCOSE 352*  --   BUN 12  --    CREATININE 0.72 0.75  CALCIUM 9.1  --    Liver Function Tests:  Recent Labs Lab 04/29/15 1803  AST 19  ALT 14*  ALKPHOS 75  BILITOT 0.4  PROT 6.6  ALBUMIN 3.0*   No results for input(s): LIPASE, AMYLASE in the last 168 hours. No results for input(s): AMMONIA in the last 168 hours. CBC:  Recent Labs Lab 04/29/15 1803 04/30/15 0238  WBC 7.2 7.2  NEUTROABS 4.3  --   HGB 14.6 14.3  HCT 42.6 42.6  MCV 87.1 87.8  PLT 197 189   Cardiac Enzymes:  Recent Labs Lab 04/30/15 0238 04/30/15 0515 04/30/15 0845  TROPONINI <0.03 <0.03 <0.03   BNP (last 3 results)  Recent Labs  09/14/14 0644 12/22/14 2330  BNP 95.8 120.1*    ProBNP (last 3 results)  Recent Labs  08/27/14 1548  PROBNP 1121.0*    CBG:  Recent Labs Lab 04/29/15 1651 04/30/15 0214 04/30/15 0740  GLUCAP 320* 251* 248*    Recent Results (from the past 240 hour(s))  MRSA PCR Screening     Status: Abnormal   Collection Time: 04/30/15  1:58 AM  Result Value Ref Range Status   MRSA by PCR POSITIVE (A) NEGATIVE Final    Comment:        The GeneXpert MRSA Assay (FDA approved for NASAL specimens only),  is one component of a comprehensive MRSA colonization surveillance program. It is not intended to diagnose MRSA infection nor to guide or monitor treatment for MRSA infections. RESULT CALLED TO, READ BACK BY AND VERIFIED WITHDerrell Lolling RN [phone] 0630 GREEN R      Studies: Dg Chest 2 View  04/29/2015   CLINICAL DATA:  Chest pain. Symptoms for 2 days. Initial encounter.  EXAM: CHEST  2 VIEW  COMPARISON:  12/22/2014.  FINDINGS: Cardiopericardial silhouette within normal limits. Mediastinal contours normal. Trachea midline. No airspace disease or effusion. Monitoring leads project over the chest.  IMPRESSION: No active cardiopulmonary disease.   Electronically Signed   By: Dereck Ligas M.D.   On: 04/29/2015 17:55    Scheduled Meds: . aspirin EC  81 mg Oral q morning - 10a  . enoxaparin  (LOVENOX) injection  80 mg Subcutaneous Daily  . furosemide  40 mg Oral Daily  . insulin aspart  0-15 Units Subcutaneous TID WC  . insulin aspart  0-5 Units Subcutaneous QHS  . insulin NPH Human  20 Units Subcutaneous BID AC & HS  . lisinopril  5 mg Oral Daily  . loratadine  10 mg Oral Daily  . metoprolol  50 mg Oral BID  . pantoprazole  40 mg Oral Daily   Continuous Infusions:   Time Spent: 25 min   Charlynne Cousins  [facility] Pager [phone].  If 7PM-7AM, please contact night-coverage at [email or web address], password [facility] 04/30/2015, 10:28 AM

## 2015-04-30 NOTE — Telephone Encounter (Signed)
Entered in Error

## 2015-04-30 NOTE — Progress Notes (Signed)
Assumed care from off going RN @ [phone]; patient sleeping at that time; @0813  patient alert & oriented; NPO ; no signs of acute distress at this time; no c/o's pain;

## 2015-04-30 NOTE — Progress Notes (Signed)
Part 1 (stress portion) of 2 day lexiscan myoview completed without significant complication.   Will make NPO past Midnight, pending resting image tomorrow, expect result of myoview tomorrow afternoon. 2 day protocol due to weight >275lbs  Hilbert Corrigan PA Pager: [phone]

## 2015-04-30 NOTE — Consult Note (Signed)
CARDIOLOGY CONSULT NOTE   Patient ID: [name] MRN: [id number], DOB/AGE: [birth year]   Admit date: 04/29/2015 Date of Consult: 04/30/2015  Primary Physician: Irven Shelling, MD Primary Cardiologist: Dr Marlou Porch  Reason for consult:  Chest pain  Problem List  Past Medical History  Diagnosis Date  . Obese   . Hypertension   . Diabetic neuropathy, painful   . Chronic indwelling Foley catheter   . Bladder spasms   . Hyperlipidemia   . GERD (gastroesophageal reflux disease)   . Neurogenic bladder   . CAD (coronary artery disease)   . PONV (postoperative nausea and vomiting)   . CHF (congestive heart failure)   . DVT (deep venous thrombosis) 1980's    LLE  . Type II diabetes mellitus   . Diabetic diarrhea   . Pneumonia 1999  . OSA (obstructive sleep apnea)     "they wanted me to wear a mask; I couldn't" (11/10/2014)  . Arthritis     "hands, ankles, knees" (11/10/2014)  . Chronic lower back pain   . Stroke 10/2011; 06/2014    right hand numbness/notes 10/20/2011, pt not sure he had stroke in 2013; "right hand weaker and right face not quite right since" (11/10/2014)    Past Surgical History  Procedure Laterality Date  . Gastroplasty    . Total knee arthroplasty Right 1990's  . Left heart catheterization with coronary angiogram N/A 10/24/2011    Procedure: LEFT HEART CATHETERIZATION WITH CORONARY ANGIOGRAM;  Surgeon: Candee Furbish, MD;  Location: [facility] CATH LAB;  Service: Cardiovascular;  Laterality: N/A;  right radial artery approach  . Cholecystectomy open  1970's?  . Joint replacement    . Carpal tunnel release Bilateral ~ 2003-2004     Allergies  Allergies  Allergen Reactions  . Ace Inhibitors Swelling    Pt tolerates lisinopril  . Lipitor [Atorvastatin Calcium] Swelling  . Metformin And Related Swelling  . Cefadroxil Hives  . Cephalexin Hives  . Morphine And Related Other (See Comments)    Sweating, feels like is "in rocky boat."  . Robaxin [Methocarbamol]  Other (See Comments)    Feels like he is shaky    HPI  69 y.o. year old male with a history of HTN, HLD, Chronic diastolic HF-Echo 123XX123 (EF of 99991111, grade 1 diastolic dysfunction, mild aortic stenosis, and trivial mitral regurgitation).  LHC 10/2011 (significant distal right coronary artery disease, diffuse, long segment up to 90% at the bifurcation of a small PDA branch. 50% stenosis of the proximal large first diagonal branch. Elevated left ventricular end-diastolic pressure of 26 mmHg consistent with diastolic dysfunction). Given the diffuse, long nature of his distal right coronary region, percutaneous intervention to this region portends a high risk to benefit ratio. It was elected to proceed with medical therapy.  Other history includes prior LE DVT, obesity, deconditioning, stroke (with residual right-sided weakness), diabetes type 2, diabetic neuropathy, neurogenic bladder with a chronic indwelling Foley catheter, and obstructive sleep apnea (not on CPAP).  He was admitted 12/07/14 with multiple complaints-one of which was chest pain. He ruled out for an MI by Troponin. The next day he complained of Rt calf pain "I think I have a DVT". D Dimer was positive and venous dopplers were done before discharge, these were negative for DVT and the pt was discharged home 12/09/14.  He presented for chest pain which awoke him from sleep on 8/19 at 2am. He notes that the pain was a heavy feeling in the center of his  chest, similar to previous angina, and it radiated down his left arm. 8-9/10 on the pain scale. When I saw him the pain had resolved, but he reported that it did not get better with nitroglycerin as it had before. Since 2am it had been coming and going . He further has noticed in the past couple of days that his blood sugar has been staying in the 300s despite him skipping meals to try and get it lower. Pain is not worsened by eating and does not appear to change with motion or activity.  He was admitted for a recent UTI and his symptoms from this has resolved. Further associated symptoms included diaphoresis, nausea and diarrhea X 1 yesterday without SOB. He reports swelling in only one leg, but his swelling appears to be symmetrical on my exam.   In the ED he had an EKG which was unchanged. Negative troponin and medicine was called for admission.   Inpatient Medications  . aspirin EC  81 mg Oral q morning - 10a  . enoxaparin (LOVENOX) injection  80 mg Subcutaneous Daily  . furosemide  40 mg Oral Daily  . insulin aspart  0-15 Units Subcutaneous TID WC  . insulin aspart  0-5 Units Subcutaneous QHS  . insulin NPH Human  20 Units Subcutaneous BID AC & HS  . lisinopril  5 mg Oral Daily  . loratadine  10 mg Oral Daily  . metoprolol  50 mg Oral BID  . pantoprazole  40 mg Oral Daily    Family History Family History  Problem Relation Age of Onset  . Hypertension Mother   . Diabetes type I Father   . Cancer Brother      Testicular Cancer  . Cancer Brother     Leukemia     Social History Social History   Social History  . Marital Status: Widowed    Spouse Name: N/A  . Number of Children: N/A  . Years of Education: N/A   Occupational History  . Retired    Social History Main Topics  . Smoking status: Former Smoker -- 2.00 packs/day for 20 years    Types: Cigarettes    Quit date: 09/10/1976  . Smokeless tobacco: Never Used  . 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 Drug Use: No  . Sexual Activity: No   Other Topics Concern  . Not on file   Social History Narrative     Review of Systems  General:  No chills, fever, night sweats or weight changes.  Cardiovascular:  No chest pain, dyspnea on exertion, edema, orthopnea, palpitations, paroxysmal nocturnal dyspnea. Dermatological: No rash, lesions/masses Respiratory: No cough, dyspnea Urologic: No hematuria, dysuria Abdominal:   No nausea, vomiting, diarrhea, bright  red blood per rectum, melena, or hematemesis Neurologic:  No visual changes, wkns, changes in mental status. All other systems reviewed and are otherwise negative except as noted above.  Physical Exam  Blood pressure 128/54, pulse 55, temperature 98 F (36.7 C), temperature source Oral, resp. rate 18, height 6\' 1"  (1.854 m), weight 344 lb 2.2 oz (156.1 kg), SpO2 98 %.  General: Pleasant, NAD Psych: Normal affect. Neuro: Alert and oriented X 3. Moves all extremities spontaneously. HEENT: Normal  Neck: Supple without bruits or JVD. Lungs:  Resp regular and unlabored, CTA. Heart: RRR no s3, s4, or murmurs. Abdomen: Soft, non-tender, non-distended, BS + x 4.  Extremities: No clubbing, cyanosis or edema. DP/PT/Radials 2+ and equal bilaterally.  Labs   Recent Labs  04/30/15 0238 04/30/15 0515 04/30/15 0845  TROPONINI <0.03 <0.03 <0.03   Lab Results  Component Value Date   WBC 7.2 04/30/2015   HGB 14.3 04/30/2015   HCT 42.6 04/30/2015   MCV 87.8 04/30/2015   PLT 189 04/30/2015    Recent Labs Lab 04/29/15 1803 04/30/15 0238  NA 135  --   K 4.6  --   CL 101  --   CO2 27  --   BUN 12  --   CREATININE 0.72 0.75  CALCIUM 9.1  --   PROT 6.6  --   BILITOT 0.4  --   ALKPHOS 75  --   ALT 14*  --   AST 19  --   GLUCOSE 352*  --    Lab Results  Component Value Date   CHOL 176 11/15/2014   HDL 33* 11/15/2014   LDLCALC 121* 11/15/2014   TRIG 112 11/15/2014   Lab Results  Component Value Date   DDIMER 0.66* 12/08/2014   Invalid input(s): POCBNP  Radiology/Studies  Dg Chest 2 View  04/29/2015   CLINICAL DATA:  Chest pain. Symptoms for 2 days. Initial encounter.  EXAM: CHEST  2 VIEW  COMPARISON:  12/22/2014.  FINDINGS: Cardiopericardial silhouette within normal limits. Mediastinal contours normal. Trachea midline. No airspace disease or effusion. Monitoring leads project over the chest.  IMPRESSION: No active cardiopulmonary disease.   Electronically Signed   By:  Dereck Ligas M.D.   On: 04/29/2015 17:55   Ct Head Wo Contrast  04/14/2015   CLINICAL DATA:  Altered mental status.  Urinary tract infection.  EXAM: CT HEAD WITHOUT CONTRAST  TECHNIQUE: Contiguous axial images were obtained from the base of the skull through the vertex without intravenous contrast.  COMPARISON:  Head MRI and CT 11/15/2014  FINDINGS: Symmetric mineralization is again seen in the basal ganglia and thalami bilaterally. Ventricles and sulci are within normal limits for age. Chronic lacunar infarct is again seen in the left caudate. There is no evidence of acute cortical infarct, intracranial hemorrhage, mass, midline shift, or extra-axial fluid collection.  Orbits are unremarkable. The visualized paranasal sinuses and mastoid air cells are clear.  IMPRESSION: No evidence of acute intracranial abnormality.   Electronically Signed   By: Logan Bores   On: 04/14/2015 14:59   Dg Shoulder Left  04/15/2015   CLINICAL DATA:  Acute left shoulder pain concentrated at lateral humeral head. No known injury.  EXAM: LEFT SHOULDER - 2+ VIEW  COMPARISON:  None.  FINDINGS: Mild degenerative changes in the left [facility] joint with joint space narrowing and spurring. No acute bony abnormality. Specifically, no fracture, subluxation, or dislocation. Soft tissues are intact.  IMPRESSION: No acute bony abnormality.   Electronically Signed   By: Rolm Baptise M.D.   On: 04/15/2015 11:51   Echocardiogram - 11/2014  - Left ventricle: The cavity size was normal. There was moderate concentric hypertrophy. Systolic function was normal. The estimated ejection fraction was in the range of 50% to 55%. Wall motion was normal; there were no regional wall motion abnormalities. Doppler parameters are consistent with abnormal left ventricular relaxation (grade 1 diastolic dysfunction). - Aortic valve: Trileaflet; severely thickened, severely calcified leaflets. Valve mobility was restricted. There was mild  stenosis. There was trivial regurgitation. Valve area (VTI): 1.09 cm^2. Valve area (Vmax): 1.14 cm^2. Valve area (Vmean): 1.06 cm^2. - Aortic root: The aortic root was normal in size. - Mitral valve: Calcified annulus. Mildly thickened  leaflets . There was trivial regurgitation. - Left atrium: The atrium was severely dilated. - Right ventricle: The cavity size was moderately dilated. Wall thickness was normal. Systolic function was normal. - Right atrium: The atrium was mildly dilated. - Tricuspid valve: There was mild regurgitation. - Pulmonary arteries: The main pulmonary artery was normal-sized. Systolic pressure was within the normal range. - Inferior vena cava: The vessel was normal in size. - Pericardium, extracardiac: There was no pericardial effusion.  Impressions: - There is no significant change when compared to the study from 06/27/2014.  ECG: SR, LBBB, unchanged from prior     ASSESSMENT AND PLAN  1. Chest pain - with some worsening DOE lately, the second admission for CP in 5 months, we will schedule a stress test today - lexiscan myoview. Troponin negative x 3. Continue ASA, lisinopril, metoprolol , not on statin and is diabetic, LDL 121.  We will start Zetia as he didn't tolerate 3-4 statins in the past, if he doesn't tolerate zetia he will be referred to our lipid clinic for PC-SK 9 inhibitors.   2. HTN - well controlled  3. HLP - as above.   4. DM - per primary team   Signed, Dorothy Spark, MD, [facility] 04/30/2015, 10:40 AM

## 2015-05-01 ENCOUNTER — Observation Stay (HOSPITAL_COMMUNITY): Payer: Medicare Other

## 2015-05-01 DIAGNOSIS — I5032 Chronic diastolic (congestive) heart failure: Secondary | ICD-10-CM | POA: Diagnosis not present

## 2015-05-01 DIAGNOSIS — I69351 Hemiplegia and hemiparesis following cerebral infarction affecting right dominant side: Secondary | ICD-10-CM | POA: Diagnosis not present

## 2015-05-01 DIAGNOSIS — E118 Type 2 diabetes mellitus with unspecified complications: Secondary | ICD-10-CM | POA: Diagnosis not present

## 2015-05-01 DIAGNOSIS — I10 Essential (primary) hypertension: Secondary | ICD-10-CM | POA: Diagnosis not present

## 2015-05-01 DIAGNOSIS — E1140 Type 2 diabetes mellitus with diabetic neuropathy, unspecified: Secondary | ICD-10-CM | POA: Diagnosis not present

## 2015-05-01 DIAGNOSIS — R079 Chest pain, unspecified: Secondary | ICD-10-CM | POA: Diagnosis not present

## 2015-05-01 DIAGNOSIS — I25119 Atherosclerotic heart disease of native coronary artery with unspecified angina pectoris: Secondary | ICD-10-CM | POA: Diagnosis not present

## 2015-05-01 LAB — GLUCOSE, CAPILLARY
GLUCOSE-CAPILLARY: 293 mg/dL — AB (ref 65–99)
GLUCOSE-CAPILLARY: 127 mg/dL — AB (ref 65–99)
GLUCOSE-CAPILLARY: 146 mg/dL — AB (ref 65–99)
GLUCOSE-CAPILLARY: 294 mg/dL — AB (ref 65–99)
GLUCOSE-CAPILLARY: 203 mg/dL — AB (ref 65–99)

## 2015-05-01 MED ORDER — SODIUM CHLORIDE 0.9 % IV SOLN: 250.0000 mL | INTRAVENOUS | Status: DC | PRN

## 2015-05-01 MED ORDER — TECHNETIUM TC 99M SESTAMIBI GENERIC - CARDIOLITE
30.0000 | Freq: Once | INTRAVENOUS | Status: AC | PRN
  Administered 2015-05-01: 30 via INTRAVENOUS

## 2015-05-01 MED ORDER — SODIUM CHLORIDE 0.9 % IV SOLN
Freq: Once | INTRAVENOUS | Status: AC
  Administered 2015-05-02: 06:00:00 via INTRAVENOUS

## 2015-05-01 MED ORDER — SODIUM CHLORIDE 0.9 % IJ SOLN: 3.0000 mL | INTRAMUSCULAR | Status: DC | PRN

## 2015-05-01 MED ORDER — ASPIRIN 81 MG PO CHEW
81.0000 mg | CHEWABLE_TABLET | ORAL | Status: AC
  Administered 2015-05-02: 81 mg via ORAL
  Filled 2015-05-01: qty 1

## 2015-05-01 MED ORDER — DIPHENHYDRAMINE HCL 25 MG PO CAPS
25.0000 mg | ORAL_CAPSULE | Freq: Once | ORAL | Status: AC
  Administered 2015-05-01: 25 mg via ORAL
  Filled 2015-05-01: qty 1

## 2015-05-01 MED ORDER — SODIUM CHLORIDE 0.9 % IJ SOLN
3.0000 mL | Freq: Two times a day (BID) | INTRAMUSCULAR | Status: DC
  Administered 2015-05-02: 3 mL via INTRAVENOUS

## 2015-05-01 NOTE — Progress Notes (Signed)
Notified Dr. Olevia Bowens pt states catheter is chronic due to bladder outlet obstruction. Catheter not d/c'd. Carroll Kinds RN

## 2015-05-01 NOTE — Progress Notes (Addendum)
[facility] PROGRESS NOTE  Assessment/Plan: Chest pain at rest - Cardiac markers negative 3, with no events on telemetry. - Cardiology consulted recommended stress which was positive. Cardiac cath rec that showed RCA disease. - Continue aspirin and metoprolol, chest pain and SOB free.  Physical deconditioning Cards to recommend when to start PT.  DM (diabetes mellitus) with complications - BG improved with NPH and SSI. - Hemoglobin A1c is 9.3  Essential Hypertension: - Continue the Lisinopril, Lasix and metoprolol - Blood pressure is at goal.    Code Status: Full Family Communication: Pt at bedside Disposition Plan: Admit for further evaluation    Consultants:  cardiology  Procedures:  CXR    Antibiotics:  None  HPI/Subjective: Has no cp or SOB.  Objective: Filed Vitals:   04/30/15 1400 04/30/15 1900 05/01/15 0012 05/01/15 0500  BP: 141/55 157/66 149/89 138/74  Pulse: 73 74 62 60  Temp:  97.8 F (36.6 C) 98.1 F (36.7 C) 98.1 F (36.7 C)  TempSrc:      Resp:  19 15 14   Height:      Weight:    154.722 kg (341 lb 1.6 oz)  SpO2:  99% 99% 98%    Intake/Output Summary (Last 24 hours) at 05/01/15 1110 Last data filed at 05/01/15 0500  Gross per 24 hour  Intake    240 ml  Output   3550 ml  Net  -3310 ml   Filed Weights   04/29/15 1649 04/30/15 0150 05/01/15 0500  Weight: 148.78 kg (328 lb) 156.1 kg (344 lb 2.2 oz) 154.722 kg (341 lb 1.6 oz)    Exam:  General: Alert, awake, oriented x3, in no acute distress.  HEENT: No bruits, no goiter.  Heart: Regular rate and rhythm. Lungs: Good air movement, clear Abdomen: Soft, nontender, nondistended, positive bowel sounds.   Data Reviewed: Basic Metabolic Panel:  Recent Labs Lab 04/29/15 1803 04/30/15 0238  NA 135  --   K 4.6  --   CL 101  --   CO2 27  --   GLUCOSE 352*  --   BUN 12  --   CREATININE 0.72 0.75  CALCIUM 9.1  --    Liver Function Tests:  Recent Labs Lab  04/29/15 1803  AST 19  ALT 14*  ALKPHOS 75  BILITOT 0.4  PROT 6.6  ALBUMIN 3.0*   No results for input(s): LIPASE, AMYLASE in the last 168 hours. No results for input(s): AMMONIA in the last 168 hours. CBC:  Recent Labs Lab 04/29/15 1803 04/30/15 0238  WBC 7.2 7.2  NEUTROABS 4.3  --   HGB 14.6 14.3  HCT 42.6 42.6  MCV 87.1 87.8  PLT 197 189   Cardiac Enzymes:  Recent Labs Lab 04/30/15 0238 04/30/15 0515 04/30/15 0845  TROPONINI <0.03 <0.03 <0.03   BNP (last 3 results)  Recent Labs  09/14/14 0644 12/22/14 2330  BNP 95.8 120.1*    ProBNP (last 3 results)  Recent Labs  08/27/14 1548  PROBNP 1121.0*    CBG:  Recent Labs Lab 04/30/15 0740 04/30/15 1223 04/30/15 1717 04/30/15 2319 05/01/15 0754  GLUCAP 248* 206* 334* 293* 127*    Recent Results (from the past 240 hour(s))  MRSA PCR Screening     Status: Abnormal   Collection Time: 04/30/15  1:58 AM  Result Value Ref Range Status   MRSA by PCR POSITIVE (A) NEGATIVE Final    Comment:        The GeneXpert MRSA Assay (FDA  approved for NASAL specimens only), is one component of a comprehensive MRSA colonization surveillance program. It is not intended to diagnose MRSA infection nor to guide or monitor treatment for MRSA infections. RESULT CALLED TO, READ BACK BY AND VERIFIED WITHDerrell Lolling RN [phone] GREEN R      Studies: Dg Chest 2 View  04/29/2015   CLINICAL DATA:  Chest pain. Symptoms for 2 days. Initial encounter.  EXAM: CHEST  2 VIEW  COMPARISON:  12/22/2014.  FINDINGS: Cardiopericardial silhouette within normal limits. Mediastinal contours normal. Trachea midline. No airspace disease or effusion. Monitoring leads project over the chest.  IMPRESSION: No active cardiopulmonary disease.   Electronically Signed   By: Dereck Ligas M.D.   On: 04/29/2015 17:55    Scheduled Meds: . aspirin EC  81 mg Oral q morning - 10a  . enoxaparin (LOVENOX) injection  80 mg Subcutaneous Daily  .  ezetimibe  10 mg Oral Daily  . furosemide  40 mg Oral Daily  . insulin aspart  0-15 Units Subcutaneous TID WC  . insulin aspart  0-5 Units Subcutaneous QHS  . insulin NPH Human  20 Units Subcutaneous BID AC & HS  . lisinopril  5 mg Oral Daily  . loratadine  10 mg Oral Daily  . metoprolol  50 mg Oral BID  . pantoprazole  40 mg Oral Daily  . regadenoson  0.4 mg Intravenous Once   Continuous Infusions:   Time Spent: 25 min   Charlynne Cousins  [facility] Pager [phone].  If 7PM-7AM, please contact night-coverage at [email or web address], password [facility] 05/01/2015, 11:10 AM

## 2015-05-01 NOTE — Progress Notes (Signed)
Rt Note:  Pt refuses cpap at this time.  Rt will continue to monitor. 

## 2015-05-01 NOTE — Progress Notes (Signed)
Orders in for patient to be NPO after midnight for part 2 of  2 day lexiscan myoview to be done 05/01/2015. Patient states that he will not remain NPO and has drinks at his bedside that he states he will continue to have or he will  leave AMA. Patient instructed on importance of remaining NPO for test and continues to refuse. MD called and updated on situation. Will continue to monitor.

## 2015-05-01 NOTE — Progress Notes (Addendum)
Abnormal myoview result noted below  IMPRESSION: 1. Reversible ischemia involving the mid and distal lateral wall and the proximal inferior wall.  2. Hypokinesis involving the entire left ventricle with the exception of the anterior wall. Akinesis involving the upper septum at the base of the heart.  3. Left ventricular ejection fraction 32%  4. High-risk stress test findings*.   Discussed with Dr. Meda Coffee, plan for cath. Based on Dr. Francesca Oman consult note, he had significant dx in RCA on cath in 10/2011, did medical management. Placed on cath board for tomorrow.  Risk and benefit of procedure explained to the patient who display clear understanding and agree to proceed.  Discussed with patient possible procedural risk include bleeding, vascular injury, renal injury, arrythmia, MI, stroke and loss of limb or life.  Will give clear liquid diet in AM (cath lab schedule very full for Monday, likely cath PM)  Signed, Almyra Deforest PA Pager: [phone]

## 2015-05-01 NOTE — Progress Notes (Signed)
Resting images of the two day nuclear stress test are being done today, if normal stress test, he can be discharged home.   Dorothy Spark 05/01/2015

## 2015-05-02 ENCOUNTER — Encounter (HOSPITAL_COMMUNITY)
Admission: EM | Disposition: A | Discharge status: Home / Self Care | Payer: Medicare Other | Source: Home / Self Care | Attending: Internal Medicine

## 2015-05-02 DIAGNOSIS — E1169 Type 2 diabetes mellitus with other specified complication: Secondary | ICD-10-CM | POA: Diagnosis not present

## 2015-05-02 DIAGNOSIS — R079 Chest pain, unspecified: Secondary | ICD-10-CM | POA: Diagnosis not present

## 2015-05-02 DIAGNOSIS — Z96651 Presence of right artificial knee joint: Secondary | ICD-10-CM | POA: Diagnosis present

## 2015-05-02 DIAGNOSIS — Z6841 Body Mass Index (BMI) 40.0 and over, adult: Secondary | ICD-10-CM | POA: Diagnosis not present

## 2015-05-02 DIAGNOSIS — R569 Unspecified convulsions: Secondary | ICD-10-CM | POA: Diagnosis present

## 2015-05-02 DIAGNOSIS — M6281 Muscle weakness (generalized): Secondary | ICD-10-CM | POA: Diagnosis not present

## 2015-05-02 DIAGNOSIS — R55 Syncope and collapse: Secondary | ICD-10-CM | POA: Diagnosis not present

## 2015-05-02 DIAGNOSIS — I441 Atrioventricular block, second degree: Secondary | ICD-10-CM | POA: Diagnosis present

## 2015-05-02 DIAGNOSIS — I495 Sick sinus syndrome: Secondary | ICD-10-CM | POA: Diagnosis not present

## 2015-05-02 DIAGNOSIS — I2510 Atherosclerotic heart disease of native coronary artery without angina pectoris: Secondary | ICD-10-CM | POA: Diagnosis not present

## 2015-05-02 DIAGNOSIS — E1140 Type 2 diabetes mellitus with diabetic neuropathy, unspecified: Secondary | ICD-10-CM | POA: Diagnosis not present

## 2015-05-02 DIAGNOSIS — L03116 Cellulitis of left lower limb: Secondary | ICD-10-CM | POA: Diagnosis not present

## 2015-05-02 DIAGNOSIS — I25119 Atherosclerotic heart disease of native coronary artery with unspecified angina pectoris: Secondary | ICD-10-CM | POA: Diagnosis not present

## 2015-05-02 DIAGNOSIS — E669 Obesity, unspecified: Secondary | ICD-10-CM | POA: Diagnosis not present

## 2015-05-02 DIAGNOSIS — Z87891 Personal history of nicotine dependence: Secondary | ICD-10-CM | POA: Diagnosis not present

## 2015-05-02 DIAGNOSIS — Z8673 Personal history of transient ischemic attack (TIA), and cerebral infarction without residual deficits: Secondary | ICD-10-CM | POA: Diagnosis not present

## 2015-05-02 DIAGNOSIS — I69351 Hemiplegia and hemiparesis following cerebral infarction affecting right dominant side: Secondary | ICD-10-CM | POA: Diagnosis not present

## 2015-05-02 DIAGNOSIS — N390 Urinary tract infection, site not specified: Secondary | ICD-10-CM | POA: Diagnosis not present

## 2015-05-02 DIAGNOSIS — I209 Angina pectoris, unspecified: Secondary | ICD-10-CM | POA: Diagnosis not present

## 2015-05-02 DIAGNOSIS — N320 Bladder-neck obstruction: Secondary | ICD-10-CM | POA: Diagnosis present

## 2015-05-02 DIAGNOSIS — I200 Unstable angina: Secondary | ICD-10-CM | POA: Diagnosis not present

## 2015-05-02 DIAGNOSIS — G8929 Other chronic pain: Secondary | ICD-10-CM | POA: Diagnosis present

## 2015-05-02 DIAGNOSIS — M545 Low back pain: Secondary | ICD-10-CM | POA: Diagnosis present

## 2015-05-02 DIAGNOSIS — E785 Hyperlipidemia, unspecified: Secondary | ICD-10-CM | POA: Diagnosis present

## 2015-05-02 DIAGNOSIS — E118 Type 2 diabetes mellitus with unspecified complications: Secondary | ICD-10-CM | POA: Diagnosis not present

## 2015-05-02 DIAGNOSIS — G4733 Obstructive sleep apnea (adult) (pediatric): Secondary | ICD-10-CM | POA: Diagnosis not present

## 2015-05-02 DIAGNOSIS — R9439 Abnormal result of other cardiovascular function study: Secondary | ICD-10-CM | POA: Diagnosis not present

## 2015-05-02 DIAGNOSIS — R931 Abnormal findings on diagnostic imaging of heart and coronary circulation: Secondary | ICD-10-CM | POA: Diagnosis not present

## 2015-05-02 DIAGNOSIS — Z23 Encounter for immunization: Secondary | ICD-10-CM | POA: Diagnosis not present

## 2015-05-02 DIAGNOSIS — I5032 Chronic diastolic (congestive) heart failure: Secondary | ICD-10-CM | POA: Diagnosis not present

## 2015-05-02 DIAGNOSIS — I447 Left bundle-branch block, unspecified: Secondary | ICD-10-CM | POA: Diagnosis present

## 2015-05-02 DIAGNOSIS — N319 Neuromuscular dysfunction of bladder, unspecified: Secondary | ICD-10-CM | POA: Diagnosis present

## 2015-05-02 DIAGNOSIS — I9751 Accidental puncture and laceration of a circulatory system organ or structure during a circulatory system procedure: Secondary | ICD-10-CM | POA: Diagnosis not present

## 2015-05-02 DIAGNOSIS — I255 Ischemic cardiomyopathy: Secondary | ICD-10-CM | POA: Diagnosis present

## 2015-05-02 DIAGNOSIS — K219 Gastro-esophageal reflux disease without esophagitis: Secondary | ICD-10-CM | POA: Diagnosis present

## 2015-05-02 DIAGNOSIS — K5900 Constipation, unspecified: Secondary | ICD-10-CM | POA: Diagnosis not present

## 2015-05-02 DIAGNOSIS — I25110 Atherosclerotic heart disease of native coronary artery with unstable angina pectoris: Secondary | ICD-10-CM | POA: Diagnosis present

## 2015-05-02 DIAGNOSIS — Z794 Long term (current) use of insulin: Secondary | ICD-10-CM | POA: Diagnosis not present

## 2015-05-02 DIAGNOSIS — I10 Essential (primary) hypertension: Secondary | ICD-10-CM | POA: Diagnosis not present

## 2015-05-02 DIAGNOSIS — I350 Nonrheumatic aortic (valve) stenosis: Secondary | ICD-10-CM | POA: Diagnosis present

## 2015-05-02 DIAGNOSIS — Y840 Cardiac catheterization as the cause of abnormal reaction of the patient, or of later complication, without mention of misadventure at the time of the procedure: Secondary | ICD-10-CM | POA: Diagnosis not present

## 2015-05-02 DIAGNOSIS — Z7982 Long term (current) use of aspirin: Secondary | ICD-10-CM | POA: Diagnosis not present

## 2015-05-02 HISTORY — PX: CARDIAC CATHETERIZATION: SHX172

## 2015-05-02 LAB — GLUCOSE, CAPILLARY
GLUCOSE-CAPILLARY: 143 mg/dL — AB (ref 65–99)
GLUCOSE-CAPILLARY: 199 mg/dL — AB (ref 65–99)
Glucose-Capillary: 189 mg/dL — ABNORMAL HIGH (ref 65–99)
Glucose-Capillary: 137 mg/dL — ABNORMAL HIGH (ref 65–99)

## 2015-05-02 LAB — PROTIME-INR
INR: 1.12 (ref 0.00–1.49)
Prothrombin Time: 14.6 seconds (ref 11.6–15.2)

## 2015-05-02 LAB — HEMOGLOBIN A1C
Hgb A1c MFr Bld: 9.3 % — ABNORMAL HIGH (ref 4.8–5.6)
MEAN PLASMA GLUCOSE: 220 mg/dL

## 2015-05-02 LAB — POCT ACTIVATED CLOTTING TIME: ACTIVATED CLOTTING TIME: 478 s

## 2015-05-02 SURGERY — LEFT HEART CATH AND CORONARY ANGIOGRAPHY
Anesthesia: LOCAL

## 2015-05-02 MED ORDER — HEPARIN SODIUM (PORCINE) 1000 UNIT/ML IJ SOLN
INTRAMUSCULAR | Status: AC
  Filled 2015-05-02: qty 1

## 2015-05-02 MED ORDER — VERAPAMIL HCL 2.5 MG/ML IV SOLN
INTRAVENOUS | Status: AC
  Filled 2015-05-02: qty 2

## 2015-05-02 MED ORDER — NITROGLYCERIN 2 % TD OINT
1.0000 [in_us] | TOPICAL_OINTMENT | Freq: Three times a day (TID) | TRANSDERMAL | Status: DC
  Administered 2015-05-02 – 2015-05-04 (×4): 1 [in_us] via TOPICAL
  Filled 2015-05-02 (×2): qty 30

## 2015-05-02 MED ORDER — NITROGLYCERIN 1 MG/10 ML FOR IR/CATH LAB
INTRA_ARTERIAL | Status: AC
  Filled 2015-05-02: qty 10

## 2015-05-02 MED ORDER — ACETAMINOPHEN 325 MG PO TABS: 650.0000 mg | ORAL_TABLET | ORAL | Status: DC | PRN

## 2015-05-02 MED ORDER — LIDOCAINE HCL (PF) 1 % IJ SOLN
INTRAMUSCULAR | Status: AC
  Filled 2015-05-02: qty 30

## 2015-05-02 MED ORDER — FENTANYL CITRATE (PF) 100 MCG/2ML IJ SOLN
INTRAMUSCULAR | Status: DC | PRN
  Administered 2015-05-02: 25 ug via INTRAVENOUS

## 2015-05-02 MED ORDER — SODIUM CHLORIDE 0.9 % IV SOLN: 250.0000 mL | INTRAVENOUS | Status: DC | PRN

## 2015-05-02 MED ORDER — HEPARIN (PORCINE) IN NACL 2-0.9 UNIT/ML-% IJ SOLN
INTRAMUSCULAR | Status: AC
  Filled 2015-05-02: qty 1500

## 2015-05-02 MED ORDER — BIVALIRUDIN BOLUS VIA INFUSION - CUPID
INTRAVENOUS | Status: DC | PRN
  Administered 2015-05-02: 116.4 mg via INTRAVENOUS

## 2015-05-02 MED ORDER — FENTANYL CITRATE (PF) 100 MCG/2ML IJ SOLN
INTRAMUSCULAR | Status: AC
  Filled 2015-05-02: qty 4

## 2015-05-02 MED ORDER — SODIUM CHLORIDE 0.9 % WEIGHT BASED INFUSION
1.0000 mL/kg/h | INTRAVENOUS | Status: AC
  Administered 2015-05-02: 1 mL/kg/h via INTRAVENOUS

## 2015-05-02 MED ORDER — MUPIROCIN 2 % EX OINT
1.0000 "application " | TOPICAL_OINTMENT | Freq: Two times a day (BID) | CUTANEOUS | Status: DC
  Administered 2015-05-02 – 2015-05-06 (×8): 1 via NASAL
  Filled 2015-05-02 (×4): qty 22

## 2015-05-02 MED ORDER — METOPROLOL TARTRATE 25 MG PO TABS
25.0000 mg | ORAL_TABLET | Freq: Two times a day (BID) | ORAL | Status: DC
  Administered 2015-05-02 (×2): 25 mg via ORAL
  Filled 2015-05-02 (×5): qty 1

## 2015-05-02 MED ORDER — ASPIRIN 81 MG PO CHEW: 81.0000 mg | CHEWABLE_TABLET | Freq: Every day | ORAL | Status: DC

## 2015-05-02 MED ORDER — MIDAZOLAM HCL 2 MG/2ML IJ SOLN
INTRAMUSCULAR | Status: DC | PRN
  Administered 2015-05-02: 1 mg via INTRAVENOUS

## 2015-05-02 MED ORDER — BIVALIRUDIN 250 MG IV SOLR
INTRAVENOUS | Status: AC
  Filled 2015-05-02: qty 250

## 2015-05-02 MED ORDER — BIVALIRUDIN 250 MG IV SOLR
250.0000 mg | INTRAVENOUS | Status: DC | PRN
  Administered 2015-05-02: 1.75 mg/kg/h via INTRAVENOUS

## 2015-05-02 MED ORDER — RADIAL COCKTAIL (HEPARIN/VERAPAMIL/LIDOCAINE/NITRO)
Status: DC | PRN
  Administered 2015-05-02: 15 mL via INTRA_ARTERIAL

## 2015-05-02 MED ORDER — CHLORHEXIDINE GLUCONATE CLOTH 2 % EX PADS
6.0000 | MEDICATED_PAD | Freq: Every day | CUTANEOUS | Status: DC
  Administered 2015-05-03 – 2015-05-06 (×3): 6 via TOPICAL

## 2015-05-02 MED ORDER — HEPARIN SODIUM (PORCINE) 1000 UNIT/ML IJ SOLN
INTRAMUSCULAR | Status: DC | PRN
  Administered 2015-05-02: 6000 [IU] via INTRAVENOUS

## 2015-05-02 MED ORDER — MIDAZOLAM HCL 2 MG/2ML IJ SOLN
INTRAMUSCULAR | Status: AC
  Filled 2015-05-02: qty 4

## 2015-05-02 MED ORDER — SODIUM CHLORIDE 0.9 % IJ SOLN
3.0000 mL | Freq: Two times a day (BID) | INTRAMUSCULAR | Status: DC
  Administered 2015-05-02 – 2015-05-06 (×8): 3 mL via INTRAVENOUS

## 2015-05-02 MED ORDER — SODIUM CHLORIDE 0.9 % IV SOLN
1.7500 mg/kg/h | Freq: Once | INTRAVENOUS | Status: DC
  Filled 2015-05-02: qty 250

## 2015-05-02 MED ORDER — SODIUM CHLORIDE 0.9 % IJ SOLN: 3.0000 mL | INTRAMUSCULAR | Status: DC | PRN

## 2015-05-02 MED ORDER — ONDANSETRON HCL 4 MG/2ML IJ SOLN: 4.0000 mg | Freq: Four times a day (QID) | INTRAMUSCULAR | Status: DC | PRN

## 2015-05-02 SURGICAL SUPPLY — 16 items
CATH INFINITI 5 FR JL3.5 (CATHETERS) ×2 IMPLANT
CATH LAUNCHER 6FR AL2 (CATHETERS) ×1 IMPLANT
CATH OPTITORQUE TIG 4.0 5F (CATHETERS) ×2 IMPLANT
CATH VISTA GUIDE 6FR AL1 (CATHETERS) ×2 IMPLANT
CATH VISTA GUIDE 6FR JR4 (CATHETERS) ×2 IMPLANT
CATH VISTA GUIDE 6FR XB3.5 (CATHETERS) ×2 IMPLANT
CATHETER LAUNCHER 6FR AL2 (CATHETERS) ×2
DEVICE RAD COMP TR BAND LRG (VASCULAR PRODUCTS) ×2 IMPLANT
GLIDESHEATH SLEND A-KIT 6F 22G (SHEATH) ×2 IMPLANT
KIT HEART LEFT (KITS) ×2 IMPLANT
PACK CARDIAC CATHETERIZATION (CUSTOM PROCEDURE TRAY) ×2 IMPLANT
SYR MEDRAD MARK V 150ML (SYRINGE) ×2 IMPLANT
TRANSDUCER W/STOPCOCK (MISCELLANEOUS) ×2 IMPLANT
TUBING CIL FLEX 10 FLL-RA (TUBING) ×2 IMPLANT
WIRE HI TORQ VERSACORE-J 145CM (WIRE) ×2 IMPLANT
WIRE SAFE-T 1.5MM-J .035X260CM (WIRE) ×2 IMPLANT

## 2015-05-02 NOTE — Progress Notes (Signed)
69 y.o. year old male with a history of HTN, HLD, Chronic diastolic HF-Echo 123XX123 (EF of 99991111, grade 1 diastolic dysfunction, mild aortic stenosis, and trivial mitral regurgitation).  LHC 10/2011 (significant distal right coronary artery disease, diffuse, long segment up to 90% at the bifurcation of a small PDA branch. 50% stenosis of the proximal large first diagonal branch. Elevated left ventricular end-diastolic pressure of 26 mmHg consistent with diastolic dysfunction). Given the diffuse, long nature of his distal right coronary region, percutaneous intervention to this region portends a high risk to benefit ratio. It was elected to proceed with medical therapy.   Now with chest pain admit and positive nuc study, for cath.  Subjective: Continues with chest pressure, has been present since admit.   Objective: Vital signs in last 24 hours: Temp:  [97.9 F (36.6 C)-98.6 F (37 C)] 98.1 F (36.7 C) (08/22 0600) Pulse Rate:  [61-67] 61 (08/22 0600) Resp:  [18] 18 (08/21 2100) BP: (112-134)/(51-60) 134/60 mmHg (08/22 0600) SpO2:  [97 %-98 %] 98 % (08/22 0600) Weight:  [342 lb 3.2 oz (155.221 kg)] 342 lb 3.2 oz (155.221 kg) (08/22 0600) Weight change: 1 lb 1.6 oz (0.499 kg) Last BM Date: 04/29/15 Intake/Output from previous day: -4770 today, since admit -8080 wt does not reflect, today 342 on admit 344 though wts?  One wt is 328.  08/21 0701 - 08/22 0700 In: 480 [P.O.:480] Out: 5250 [Urine:5250] Intake/Output this shift:    PE: General:Pleasant affect, NAD, poor memory on health issues, and meds. Skin:Warm and dry, brisk capillary refill HEENT:normocephalic, sclera clear, mucus membranes moist Heart:S1S2 RRR without murmur, gallup, rub or click Lungs:clear ant. without rales, rhonchi, or wheezes Abd obese, soft, non tender, + BS, do not palpate liver spleen or masses Ext:+ lower ext edema, 2+ radial pulses Neuro:alert and oriented X 3, MAE, follows commands, + facial  symmetry Tele:  SR with PVCs and couplets, occ Wenckebach type I   Lab Results:  Recent Labs  04/29/15 1803 04/30/15 0238  WBC 7.2 7.2  HGB 14.6 14.3  HCT 42.6 42.6  PLT 197 189   BMET  Recent Labs  04/29/15 1803 04/30/15 0238  NA 135  --   K 4.6  --   CL 101  --   CO2 27  --   GLUCOSE 352*  --   BUN 12  --   CREATININE 0.72 0.75  CALCIUM 9.1  --     Recent Labs  04/30/15 0515 04/30/15 0845  TROPONINI <0.03 <0.03    Lab Results  Component Value Date   CHOL 163 04/30/2015   HDL 30* 04/30/2015   LDLCALC 106* 04/30/2015   TRIG 133 04/30/2015   CHOLHDL 5.4 04/30/2015   Lab Results  Component Value Date   HGBA1C 9.6* 04/14/2015     Lab Results  Component Value Date   TSH 3.644 11/15/2014    Hepatic Function Panel  Recent Labs  04/29/15 1803  PROT 6.6  ALBUMIN 3.0*  AST 19  ALT 14*  ALKPHOS 75  BILITOT 0.4    Recent Labs  04/30/15 1047  CHOL 163   No results for input(s): PROTIME in the last 72 hours.     Studies/Results: Nm Myocar Multi W/spect W/wall Motion / Ef  05/01/2015   CLINICAL DATA:  Six 69-year-old with current history of hypertension, hyperlipidemia, chronic diastolic heart failure, mild aortic stenosis and minimal mitral regurgitation. Prior cardiac catheterization in February, 2013 demonstrating right coronary  artery disease with 90% long segment PDA branch stenosis and proximal 50% stenosis of a large 1st diagonal. Patient presented 04/29/2015 with acute chest pain radiating into the left arm.  EXAM: MYOCARDIAL IMAGING WITH SPECT (REST AND PHARMACOLOGIC-STRESS - 2 DAY PROTOCOL)  GATED LEFT VENTRICULAR WALL MOTION STUDY  LEFT VENTRICULAR EJECTION FRACTION  TECHNIQUE: Standard myocardial SPECT imaging was performed after resting intravenous injection of 32.3 mCi Tc-53m sestamibi. On a different day, intravenous infusion of Lexiscan was performed under the supervision of the Cardiology staff. At peak effect of the drug, 31.3 mCi  Tc-40m sestamibi was injected intravenously and standard myocardial SPECT imaging was performed. Quantitative gated imaging was also performed to evaluate left ventricular wall motion, and estimate left ventricular ejection fraction.  COMPARISON:  None.  FINDINGS: Subdiaphragmatic activity is present adjacent to the heart on the resting images.  Perfusion: Severe perfusion defects involving small to moderate-sized apical, anteroapical and apicoseptal segments. Severe perfusion defect involving a large segment of the entire lateral wall. Moderate perfusion defect involving a large segment of the entire inferior wall. Normal perfusion is identified only involving the anterior wall. Reversibility involving the mid and distal lateral wall and the proximal inferior wall.  Wall Motion: Hypokinesis involving the entire left ventricle with the exception of the anterior wall. Akinesis involving the upper septum at the base of the heart.  Left Ventricular Ejection Fraction: [id number]  End diastolic volume A999333 ml  End systolic volume 123456 ml  IMPRESSION: 1. Reversible ischemia involving the mid and distal lateral wall and the proximal inferior wall.  2. Hypokinesis involving the entire left ventricle with the exception of the anterior wall. Akinesis involving the upper septum at the base of the heart.  3. Left ventricular ejection fraction 32%  4. High-risk stress test findings*.  *2012 Appropriate Use Criteria for Coronary Revascularization Focused Update: J Am Coll Cardiol. N6492421. http://[email or web address]?articleid=1201161   Electronically Signed   By: Evangeline Dakin M.D.   On: 05/01/2015 12:26    Medications: I have reviewed the patient's current medications. Scheduled Meds: . aspirin EC  81 mg Oral q morning - 10a  . enoxaparin (LOVENOX) injection  80 mg Subcutaneous Daily  . ezetimibe  10 mg Oral Daily  . furosemide  40 mg Oral Daily  . insulin aspart  0-15 Units Subcutaneous TID WC  .  insulin aspart  0-5 Units Subcutaneous QHS  . insulin NPH Human  20 Units Subcutaneous BID AC & HS  . lisinopril  5 mg Oral Daily  . loratadine  10 mg Oral Daily  . metoprolol  50 mg Oral BID  . pantoprazole  40 mg Oral Daily  . regadenoson  0.4 mg Intravenous Once  . sodium chloride  3 mL Intravenous Q12H   Continuous Infusions:  PRN Meds:.sodium chloride, acetaminophen, gi cocktail, HYDROcodone-acetaminophen, nitroGLYCERIN, ondansetron (ZOFRAN) IV, oxybutynin, senna, sodium chloride  Assessment/Plan: Principal Problem:   Chest pain at rest- troponins negative but positive nuc study with reversible ischemia of mad and distal lat and pox inf wall. EF 32%  Plan for cath today. Still with chest pressure will add NTG paste.   Active Problems:   DM (diabetes mellitus) with complications   Diabetic neuropathy   Hypertension- controlled- ACE listed as allergy but is on lisinopril   Physical deconditioning   CAD with cath 2013 with significant distal right coronary artery disease, diffuse, long segment up to 90% at the bifurcation of a small PDA branch. 50% stenosis of the proximal large first diagonal  branch. Elevated left ventricular end-diastolic pressure of 26 mmHg consistent with diastolic dysfunction. Given the diffuse, long nature of his distal right coronary region, percutaneous intervention to this region portends a high risk to benefit ratio he has been treated with medication. Now with angina.    Hyperlipidemia with LDL goal< 70 but LDL currently 106- on Zetia now new- pt intolerant to lipitor and several others but does not know how intolerant.    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 :15 minutes. [facility] R  Nurse Practitioner Certified Pager [phone] or after 5pm and on weekends call [phone] 05/02/2015, 8:47 AM   The patient was seen, examined and  discussed with Cecilie Kicks, NP and I agree with the above.   69 year old male with known CAD, presented with typical chest pain and has abnormal stress test with reversible ischemia involving the mid and distal lateral wall and the proximal inferior wall. Hypokinesis involving the entire left ventricle with the exception of the anterior wall. Akinesis involving the upper septum  at the base of the heart and decreased LVEF 32%.  He is scheduled for a cath today. Continue ASA, lisinoprol, metoprolol, Crea 0.7. We started Zetia as he didn't tolerate 3-4 statins in the past, if he doesn't tolerate zetia he will be referred to our lipid clinic for PC-SK 9 inhibitors.   Dorothy Spark 05/02/2015

## 2015-05-02 NOTE — Progress Notes (Signed)
In to assess pt for qhs cpap. Refusing at this time. RN aware. Will continue to monitor.

## 2015-05-02 NOTE — Interval H&P Note (Signed)
Cath Lab Visit (complete for each Cath Lab visit)  Clinical Evaluation Leading to the Procedure:   ACS: Yes.    Non-ACS:    Anginal Classification: CCS IV  Anti-ischemic medical therapy: Minimal Therapy (1 class of medications)  Non-Invasive Test Results: High-risk stress test findings: cardiac mortality >3%/year  Prior CABG: No previous CABG      History and Physical Interval Note:  05/02/2015 1:58 PM  Ponce L Berberich  has presented today for surgery, with the diagnosis of unstable angina  The various methods of treatment have been discussed with the patient and family. After consideration of risks, benefits and other options for treatment, the patient has consented to  Procedure(s): Left Heart Cath and Coronary Angiography (N/A) as a surgical intervention .  The patient's history has been reviewed, patient examined, no change in status, stable for surgery.  I have reviewed the patient's chart and labs.  Questions were answered to the patient's satisfaction.     Quay Burow

## 2015-05-02 NOTE — H&P (View-Only) (Signed)
69 y.o. year old male with a history of HTN, HLD, Chronic diastolic HF-Echo 123XX123 (EF of 99991111, grade 1 diastolic dysfunction, mild aortic stenosis, and trivial mitral regurgitation).  LHC 10/2011 (significant distal right coronary artery disease, diffuse, long segment up to 90% at the bifurcation of a small PDA branch. 50% stenosis of the proximal large first diagonal branch. Elevated left ventricular end-diastolic pressure of 26 mmHg consistent with diastolic dysfunction). Given the diffuse, long nature of his distal right coronary region, percutaneous intervention to this region portends a high risk to benefit ratio. It was elected to proceed with medical therapy.   Now with chest pain admit and positive nuc study, for cath.  Subjective: Continues with chest pressure, has been present since admit.   Objective: Vital signs in last 24 hours: Temp:  [97.9 F (36.6 C)-98.6 F (37 C)] 98.1 F (36.7 C) (08/22 0600) Pulse Rate:  [61-67] 61 (08/22 0600) Resp:  [18] 18 (08/21 2100) BP: (112-134)/(51-60) 134/60 mmHg (08/22 0600) SpO2:  [97 %-98 %] 98 % (08/22 0600) Weight:  [342 lb 3.2 oz (155.221 kg)] 342 lb 3.2 oz (155.221 kg) (08/22 0600) Weight change: 1 lb 1.6 oz (0.499 kg) Last BM Date: 04/29/15 Intake/Output from previous day: -4770 today, since admit -8080 wt does not reflect, today 342 on admit 344 though wts?  One wt is 328.  08/21 0701 - 08/22 0700 In: 480 [P.O.:480] Out: 5250 [Urine:5250] Intake/Output this shift:    PE: General:Pleasant affect, NAD, poor memory on health issues, and meds. Skin:Warm and dry, brisk capillary refill HEENT:normocephalic, sclera clear, mucus membranes moist Heart:S1S2 RRR without murmur, gallup, rub or click Lungs:clear ant. without rales, rhonchi, or wheezes Abd obese, soft, non tender, + BS, do not palpate liver spleen or masses Ext:+ lower ext edema, 2+ radial pulses Neuro:alert and oriented X 3, MAE, follows commands, + facial  symmetry Tele:  SR with PVCs and couplets, occ Wenckebach type I   Lab Results:  Recent Labs  04/29/15 1803 04/30/15 0238  WBC 7.2 7.2  HGB 14.6 14.3  HCT 42.6 42.6  PLT 197 189   BMET  Recent Labs  04/29/15 1803 04/30/15 0238  NA 135  --   K 4.6  --   CL 101  --   CO2 27  --   GLUCOSE 352*  --   BUN 12  --   CREATININE 0.72 0.75  CALCIUM 9.1  --     Recent Labs  04/30/15 0515 04/30/15 0845  TROPONINI <0.03 <0.03    Lab Results  Component Value Date   CHOL 163 04/30/2015   HDL 30* 04/30/2015   LDLCALC 106* 04/30/2015   TRIG 133 04/30/2015   CHOLHDL 5.4 04/30/2015   Lab Results  Component Value Date   HGBA1C 9.6* 04/14/2015     Lab Results  Component Value Date   TSH 3.644 11/15/2014    Hepatic Function Panel  Recent Labs  04/29/15 1803  PROT 6.6  ALBUMIN 3.0*  AST 19  ALT 14*  ALKPHOS 75  BILITOT 0.4    Recent Labs  04/30/15 1047  CHOL 163   No results for input(s): PROTIME in the last 72 hours.     Studies/Results: Nm Myocar Multi W/spect W/wall Motion / Ef  05/01/2015   CLINICAL DATA:  Six 89-year-old with current history of hypertension, hyperlipidemia, chronic diastolic heart failure, mild aortic stenosis and minimal mitral regurgitation. Prior cardiac catheterization in February, 2013 demonstrating right coronary  artery disease with 90% long segment PDA branch stenosis and proximal 50% stenosis of a large 1st diagonal. Patient presented 04/29/2015 with acute chest pain radiating into the left arm.  EXAM: MYOCARDIAL IMAGING WITH SPECT (REST AND PHARMACOLOGIC-STRESS - 2 DAY PROTOCOL)  GATED LEFT VENTRICULAR WALL MOTION STUDY  LEFT VENTRICULAR EJECTION FRACTION  TECHNIQUE: Standard myocardial SPECT imaging was performed after resting intravenous injection of 32.3 mCi Tc-69m sestamibi. On a different day, intravenous infusion of Lexiscan was performed under the supervision of the Cardiology staff. At peak effect of the drug, 31.3 mCi  Tc-66m sestamibi was injected intravenously and standard myocardial SPECT imaging was performed. Quantitative gated imaging was also performed to evaluate left ventricular wall motion, and estimate left ventricular ejection fraction.  COMPARISON:  None.  FINDINGS: Subdiaphragmatic activity is present adjacent to the heart on the resting images.  Perfusion: Severe perfusion defects involving small to moderate-sized apical, anteroapical and apicoseptal segments. Severe perfusion defect involving a large segment of the entire lateral wall. Moderate perfusion defect involving a large segment of the entire inferior wall. Normal perfusion is identified only involving the anterior wall. Reversibility involving the mid and distal lateral wall and the proximal inferior wall.  Wall Motion: Hypokinesis involving the entire left ventricle with the exception of the anterior wall. Akinesis involving the upper septum at the base of the heart.  Left Ventricular Ejection Fraction: [id number]  End diastolic volume [id number] ml  End systolic volume 123456 ml  IMPRESSION: 1. Reversible ischemia involving the mid and distal lateral wall and the proximal inferior wall.  2. Hypokinesis involving the entire left ventricle with the exception of the anterior wall. Akinesis involving the upper septum at the base of the heart.  3. Left ventricular ejection fraction 32%  4. High-risk stress test findings*.  *2012 Appropriate Use Criteria for Coronary Revascularization Focused Update: J Am Coll Cardiol. N6492421. http://[email or web address]?articleid=1201161   Electronically Signed   By: Evangeline Dakin M.D.   On: 05/01/2015 12:26    Medications: I have reviewed the patient's current medications. Scheduled Meds: . aspirin EC  81 mg Oral q morning - 10a  . enoxaparin (LOVENOX) injection  80 mg Subcutaneous Daily  . ezetimibe  10 mg Oral Daily  . furosemide  40 mg Oral Daily  . insulin aspart  0-15 Units Subcutaneous TID WC  .  insulin aspart  0-5 Units Subcutaneous QHS  . insulin NPH Human  20 Units Subcutaneous BID AC & HS  . lisinopril  5 mg Oral Daily  . loratadine  10 mg Oral Daily  . metoprolol  50 mg Oral BID  . pantoprazole  40 mg Oral Daily  . regadenoson  0.4 mg Intravenous Once  . sodium chloride  3 mL Intravenous Q12H   Continuous Infusions:  PRN Meds:.sodium chloride, acetaminophen, gi cocktail, HYDROcodone-acetaminophen, nitroGLYCERIN, ondansetron (ZOFRAN) IV, oxybutynin, senna, sodium chloride  Assessment/Plan: Principal Problem:   Chest pain at rest- troponins negative but positive nuc study with reversible ischemia of mad and distal lat and pox inf wall. EF 32%  Plan for cath today. Still with chest pressure will add NTG paste.   Active Problems:   DM (diabetes mellitus) with complications   Diabetic neuropathy   Hypertension- controlled- ACE listed as allergy but is on lisinopril   Physical deconditioning   CAD with cath 2013 with significant distal right coronary artery disease, diffuse, long segment up to 90% at the bifurcation of a small PDA branch. 50% stenosis of the proximal large first diagonal  branch. Elevated left ventricular end-diastolic pressure of 26 mmHg consistent with diastolic dysfunction. Given the diffuse, long nature of his distal right coronary region, percutaneous intervention to this region portends a high risk to benefit ratio he has been treated with medication. Now with angina.    Hyperlipidemia with LDL goal< 70 but LDL currently 106- on Zetia now new- pt intolerant to lipitor and several others but does not know how intolerant.    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 :15 minutes. [facility] R  Nurse Practitioner Certified Pager [phone] or after 5pm and on weekends call [phone] 05/02/2015, 8:47 AM   The patient was seen, examined and  discussed with Cecilie Kicks, NP and I agree with the above.   69 year old male with known CAD, presented with typical chest pain and has abnormal stress test with reversible ischemia involving the mid and distal lateral wall and the proximal inferior wall. Hypokinesis involving the entire left ventricle with the exception of the anterior wall. Akinesis involving the upper septum  at the base of the heart and decreased LVEF 32%.  He is scheduled for a cath today. Continue ASA, lisinoprol, metoprolol, Crea 0.7. We started Zetia as he didn't tolerate 3-4 statins in the past, if he doesn't tolerate zetia he will be referred to our lipid clinic for PC-SK 9 inhibitors.   Dorothy Spark 05/02/2015

## 2015-05-03 ENCOUNTER — Encounter (HOSPITAL_COMMUNITY): Payer: Self-pay | Admitting: Cardiovascular Disease

## 2015-05-03 DIAGNOSIS — R931 Abnormal findings on diagnostic imaging of heart and coronary circulation: Secondary | ICD-10-CM

## 2015-05-03 LAB — CBC
HCT: 40.3 % (ref 39.0–52.0)
HEMOGLOBIN: 13.8 g/dL (ref 13.0–17.0)
MCH: 29.8 pg (ref 26.0–34.0)
MCHC: 34.2 g/dL (ref 30.0–36.0)
MCV: 87 fL (ref 78.0–100.0)
Platelets: 177 10*3/uL (ref 150–400)
RBC: 4.63 MIL/uL (ref 4.22–5.81)
RDW: 13.3 % (ref 11.5–15.5)
WBC: 7.3 10*3/uL (ref 4.0–10.5)

## 2015-05-03 LAB — GLUCOSE, CAPILLARY
GLUCOSE-CAPILLARY: 125 mg/dL — AB (ref 65–99)
GLUCOSE-CAPILLARY: 146 mg/dL — AB (ref 65–99)
GLUCOSE-CAPILLARY: 106 mg/dL — AB (ref 65–99)
Glucose-Capillary: 183 mg/dL — ABNORMAL HIGH (ref 65–99)

## 2015-05-03 LAB — BASIC METABOLIC PANEL
Anion gap: 5 (ref 5–15)
BUN: 9 mg/dL (ref 6–20)
CHLORIDE: 102 mmol/L (ref 101–111)
CO2: 27 mmol/L (ref 22–32)
Calcium: 8.3 mg/dL — ABNORMAL LOW (ref 8.9–10.3)
Creatinine, Ser: 0.71 mg/dL (ref 0.61–1.24)
GFR calc non Af Amer: >60 mL/min (ref >60)
Glucose, Bld: 165 mg/dL — ABNORMAL HIGH (ref 65–99)
POTASSIUM: 3.9 mmol/L (ref 3.5–5.1)
SODIUM: 134 mmol/L — AB (ref 135–145)

## 2015-05-03 LAB — TROPONIN I
TROPONIN I: 0.11 ng/mL — AB (ref <0.031)
Troponin I: 0.09 ng/mL — ABNORMAL HIGH (ref <0.031)
Troponin I: 0.12 ng/mL — ABNORMAL HIGH (ref <0.031)

## 2015-05-03 MED ORDER — METOPROLOL TARTRATE 50 MG PO TABS
50.0000 mg | ORAL_TABLET | Freq: Two times a day (BID) | ORAL | Status: DC
  Administered 2015-05-03 – 2015-05-06 (×7): 50 mg via ORAL
  Filled 2015-05-03 (×6): qty 1

## 2015-05-03 MED ORDER — MORPHINE SULFATE (PF) 2 MG/ML IV SOLN
1.0000 mg | INTRAVENOUS | Status: DC | PRN
  Administered 2015-05-03: 2 mg via INTRAVENOUS
  Administered 2015-05-03: 1 mg via INTRAVENOUS
  Administered 2015-05-04 – 2015-05-06 (×4): 2 mg via INTRAVENOUS
  Filled 2015-05-03 (×7): qty 1

## 2015-05-03 MED ORDER — LOPERAMIDE HCL 1 MG/5ML PO LIQD
2.0000 mg | ORAL | Status: DC | PRN
  Administered 2015-05-03 (×2): 2 mg via ORAL
  Filled 2015-05-03 (×3): qty 10

## 2015-05-03 MED FILL — Heparin Sodium (Porcine) 2 Unit/ML in Sodium Chloride 0.9%: INTRAMUSCULAR | Qty: 1500 | Status: AC

## 2015-05-03 MED FILL — Lidocaine HCl Local Preservative Free (PF) Inj 1%: INTRAMUSCULAR | Qty: 30 | Status: AC

## 2015-05-03 NOTE — Progress Notes (Signed)
Subjective: Continues with chest pressure, has been present since admit.   Objective: Vital signs in last 24 hours: Temp:  [97 F (36.1 C)-97.9 F (36.6 C)] 97.9 F (36.6 C) (08/23 0816) Pulse Rate:  [0-198] 73 (08/23 0800) Resp:  [0-140] 15 (08/23 0800) BP: (101-172)/(45-93) 132/53 mmHg (08/23 0800) SpO2:  [0 %-100 %] 98 % (08/23 0800) Weight:  [344 lb 2.2 oz (156.1 kg)] 344 lb 2.2 oz (156.1 kg) (08/22 1600) Weight change: 1 lb 15 oz (0.879 kg) Last BM Date: 05/01/15 Intake/Output from previous day: -4770 today, since admit -8080 wt does not reflect, today 342 on admit 344 though wts?  One wt is 328.  08/22 0701 - 08/23 0700 In: [id number] [P.O.:600; I.V.:63] Out: 2875 [Urine:2875] Intake/Output this shift:    PE: General:Pleasant affect, NAD, poor memory on health issues, and meds. Skin:Warm and dry, brisk capillary refill HEENT:normocephalic, sclera clear, mucus membranes moist Heart:S1S2 RRR without murmur, gallup, rub or click Lungs:clear ant. without rales, rhonchi, or wheezes Abd obese, soft, non tender, + BS, do not palpate liver spleen or masses Ext:+ lower ext edema, 2+ radial pulses Neuro:alert and oriented X 3, MAE, follows commands, + facial symmetry Tele:  SR with PVCs and couplets, occ Wenckebach type I   Lab Results:  Recent Labs  05/03/15 0229  WBC 7.3  HGB 13.8  HCT 40.3  PLT 177   BMET  Recent Labs  05/03/15 0229  NA 134*  K 3.9  CL 102  CO2 27  GLUCOSE 165*  BUN 9  CREATININE 0.71  CALCIUM 8.3*   No results for input(s): TROPONINI in the last 72 hours.  Invalid input(s): CK, MB  Lab Results  Component Value Date   CHOL 163 04/30/2015   HDL 30* 04/30/2015   LDLCALC 106* 04/30/2015   TRIG 133 04/30/2015   CHOLHDL 5.4 04/30/2015   Lab Results  Component Value Date   HGBA1C 9.3* 04/30/2015     Lab Results  Component Value Date   TSH 3.644 11/15/2014    Hepatic Function Panel No results for input(s): PROT, ALBUMIN, AST,  ALT, ALKPHOS, BILITOT, BILIDIR, IBILI in the last 72 hours.  Recent Labs  04/30/15 1047  CHOL 163   No results for input(s): PROTIME in the last 72 hours.     Studies/Results: Nm Myocar Multi W/spect W/wall Motion / Ef  05/01/2015   CLINICAL DATA:  Six 67-year-old with current history of hypertension, hyperlipidemia, chronic diastolic heart failure, mild aortic stenosis and minimal mitral regurgitation. Prior cardiac catheterization in February, 2013 demonstrating right coronary artery disease with 90% long segment PDA branch stenosis and proximal 50% stenosis of a large 1st diagonal. Patient presented 04/29/2015 with acute chest pain radiating into the left arm.  EXAM: MYOCARDIAL IMAGING WITH SPECT (REST AND PHARMACOLOGIC-STRESS - 2 DAY PROTOCOL)  GATED LEFT VENTRICULAR WALL MOTION STUDY  LEFT VENTRICULAR EJECTION FRACTION  TECHNIQUE: Standard myocardial SPECT imaging was performed after resting intravenous injection of 32.3 mCi Tc-22m sestamibi. On a different day, intravenous infusion of Lexiscan was performed under the supervision of the Cardiology staff. At peak effect of the drug, 31.3 mCi Tc-48m sestamibi was injected intravenously and standard myocardial SPECT imaging was performed. Quantitative gated imaging was also performed to evaluate left ventricular wall motion, and estimate left ventricular ejection fraction.  COMPARISON:  None.  FINDINGS: Subdiaphragmatic activity is present adjacent to the heart on the resting images.  Perfusion: Severe perfusion defects involving small to moderate-sized apical,  anteroapical and apicoseptal segments. Severe perfusion defect involving a large segment of the entire lateral wall. Moderate perfusion defect involving a large segment of the entire inferior wall. Normal perfusion is identified only involving the anterior wall. Reversibility involving the mid and distal lateral wall and the proximal inferior wall.  Wall Motion: Hypokinesis involving the entire  left ventricle with the exception of the anterior wall. Akinesis involving the upper septum at the base of the heart.  Left Ventricular Ejection Fraction: [id number]  End diastolic volume [id number] ml  End systolic volume 123456 ml  IMPRESSION: 1. Reversible ischemia involving the mid and distal lateral wall and the proximal inferior wall.  2. Hypokinesis involving the entire left ventricle with the exception of the anterior wall. Akinesis involving the upper septum at the base of the heart.  3. Left ventricular ejection fraction 32%  4. High-risk stress test findings*.  *2012 Appropriate Use Criteria for Coronary Revascularization Focused Update: J Am Coll Cardiol. N6492421. http://[email or web address]?articleid=[email or web address]   Electronically Signed   By: Evangeline Dakin M.D.   On: 05/01/2015 12:26    Medications: I have reviewed the patient's current medications. Scheduled Meds: . aspirin EC  81 mg Oral q morning - 10a  . bivalirudin (ANGIOMAX) infusion 5 mg/mL (Cath Lab,ACS,PCI indication)  1.75 mg/kg/hr Intravenous Once  . Chlorhexidine Gluconate Cloth  6 each Topical Q0600  . enoxaparin (LOVENOX) injection  80 mg Subcutaneous Daily  . ezetimibe  10 mg Oral Daily  . furosemide  40 mg Oral Daily  . insulin aspart  0-15 Units Subcutaneous TID WC  . insulin aspart  0-5 Units Subcutaneous QHS  . insulin NPH Human  20 Units Subcutaneous BID AC & HS  . lisinopril  5 mg Oral Daily  . loratadine  10 mg Oral Daily  . metoprolol  25 mg Oral BID  . mupirocin ointment  1 application Nasal BID  . nitroGLYCERIN  1 inch Topical 3 times per day  . pantoprazole  40 mg Oral Daily  . regadenoson  0.4 mg Intravenous Once  . sodium chloride  3 mL Intravenous Q12H   Continuous Infusions:  PRN Meds:.sodium chloride, acetaminophen, gi cocktail, HYDROcodone-acetaminophen, nitroGLYCERIN, ondansetron (ZOFRAN) IV, oxybutynin, senna, sodium chloride  Assessment/Plan: Principal Problem:   Chest pain at rest-  troponins negative but positive nuc study with reversible ischemia of mad and distal lat and pox inf wall. EF 32%  Plan for cath today. Still with chest pressure will add NTG paste.   Active Problems:   DM (diabetes mellitus) with complications   Diabetic neuropathy   Hypertension- controlled- ACE listed as allergy but is on lisinopril   Physical deconditioning   CAD with cath 2013 with significant distal right coronary artery disease, diffuse, long segment up to 90% at the bifurcation of a small PDA branch. 50% stenosis of the proximal large first diagonal branch. Elevated left ventricular end-diastolic pressure of 26 mmHg consistent with diastolic dysfunction. Given the diffuse, long nature of his distal right coronary region, percutaneous intervention to this region portends a high risk to benefit ratio he has been treated with medication. Now with angina.    Hyperlipidemia with LDL goal< 70 but LDL currently 106- on Zetia now new- pt intolerant to lipitor and several others but does not know how intolerant.    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 Hypokinesis involving the entire left ventricle with the exception of the anterior wall. Akinesis involving the upper septum  at the base of the heart and decreased LVEF 32%.  We started Zetia as he didn't tolerate 3-4 statins in the past, if he doesn't tolerate zetia he will be referred to our lipid clinic for PC-SK 9 inhibitors.   He underwent left cardiac cath yesterday by Dr Gwenlyn Found, he was found to have moderate distal RCA disease with an occluded PLA system compared to his cath in 2013. I was unable to engage the left main and image the left system. Unfortunately, there was a catheter dissection of the origin of the RCA With staining that spiraled to the mid  vessel. My intent was to stent this but it ultimately closed spontaneously maintaining a widely patent lumen and normal flow. The decision was made not to intervene. The patient did receive Angiomax bolus which was discontinued. The radial sheath was removed and a TR band was placed. The patient ultimately will need to have the remainder was completed on his left system femorally in several days.  He continues to have chest pressure with minimal relief with NTG, I will increase metoprolol and try morphine iv.  Dorothy Spark 05/03/2015

## 2015-05-04 ENCOUNTER — Encounter (HOSPITAL_COMMUNITY)
Admission: EM | Disposition: A | Discharge status: Home / Self Care | Payer: Self-pay | Source: Home / Self Care | Attending: Internal Medicine

## 2015-05-04 DIAGNOSIS — E118 Type 2 diabetes mellitus with unspecified complications: Secondary | ICD-10-CM

## 2015-05-04 DIAGNOSIS — I2510 Atherosclerotic heart disease of native coronary artery without angina pectoris: Secondary | ICD-10-CM

## 2015-05-04 DIAGNOSIS — I495 Sick sinus syndrome: Secondary | ICD-10-CM

## 2015-05-04 HISTORY — PX: CARDIAC CATHETERIZATION: SHX172

## 2015-05-04 LAB — GLUCOSE, CAPILLARY
Glucose-Capillary: 136 mg/dL — ABNORMAL HIGH (ref 65–99)
Glucose-Capillary: 186 mg/dL — ABNORMAL HIGH (ref 65–99)
Glucose-Capillary: 150 mg/dL — ABNORMAL HIGH (ref 65–99)

## 2015-05-04 SURGERY — LEFT HEART CATH AND CORONARY ANGIOGRAPHY
Anesthesia: LOCAL

## 2015-05-04 MED ORDER — NITROGLYCERIN IN D5W 200-5 MCG/ML-% IV SOLN
0.0000 ug/min | INTRAVENOUS | Status: DC
  Administered 2015-05-04: 10 ug/min via INTRAVENOUS
  Filled 2015-05-04: qty 250

## 2015-05-04 MED ORDER — SODIUM CHLORIDE 0.9 % IV SOLN: 250.0000 mL | INTRAVENOUS | Status: DC | PRN

## 2015-05-04 MED ORDER — FENTANYL CITRATE (PF) 100 MCG/2ML IJ SOLN
INTRAMUSCULAR | Status: AC
  Filled 2015-05-04: qty 4

## 2015-05-04 MED ORDER — SODIUM CHLORIDE 0.9 % IJ SOLN: 3.0000 mL | INTRAMUSCULAR | Status: DC | PRN

## 2015-05-04 MED ORDER — SODIUM CHLORIDE 0.9 % IV SOLN
INTRAVENOUS | Status: AC
  Administered 2015-05-04: 17:00:00 via INTRAVENOUS

## 2015-05-04 MED ORDER — SODIUM CHLORIDE 0.9 % IJ SOLN
3.0000 mL | Freq: Two times a day (BID) | INTRAMUSCULAR | Status: DC
  Administered 2015-05-04: 3 mL via INTRAVENOUS

## 2015-05-04 MED ORDER — MIDAZOLAM HCL 2 MG/2ML IJ SOLN
INTRAMUSCULAR | Status: AC
  Filled 2015-05-04: qty 4

## 2015-05-04 MED ORDER — LIDOCAINE HCL (PF) 1 % IJ SOLN
INTRAMUSCULAR | Status: AC
  Filled 2015-05-04: qty 30

## 2015-05-04 MED ORDER — HEPARIN (PORCINE) IN NACL 2-0.9 UNIT/ML-% IJ SOLN
INTRAMUSCULAR | Status: AC
  Filled 2015-05-04: qty 1000

## 2015-05-04 MED ORDER — SODIUM CHLORIDE 0.9 % IJ SOLN: 3.0000 mL | Freq: Two times a day (BID) | INTRAMUSCULAR | Status: DC

## 2015-05-04 MED ORDER — ONDANSETRON HCL 40 MG/20ML IJ SOLN
8.0000 mg | Freq: Four times a day (QID) | INTRAMUSCULAR | Status: DC
  Administered 2015-05-05: 8 mg via INTRAVENOUS
  Filled 2015-05-04 (×12): qty 4

## 2015-05-04 MED ORDER — SODIUM CHLORIDE 0.9 % IV SOLN: INTRAVENOUS | Status: DC

## 2015-05-04 SURGICAL SUPPLY — 16 items
CATH INFINITI 5FR ANG PIGTAIL (CATHETERS) ×2 IMPLANT
CATH INFINITI 5FR MULTPACK ANG (CATHETERS) ×2 IMPLANT
CATH OPTITORQUE JACKY 4.0 5F (CATHETERS) ×2 IMPLANT
DEVICE CLOSURE MYNXGRIP 5F (Vascular Products) ×2 IMPLANT
DEVICE RAD COMP TR BAND LRG (VASCULAR PRODUCTS) IMPLANT
GLIDESHEATH SLEND SS 6F .021 (SHEATH) IMPLANT
HOVERMATT SINGLE USE (MISCELLANEOUS) ×2 IMPLANT
KIT HEART LEFT (KITS) ×2 IMPLANT
PACK CARDIAC CATHETERIZATION (CUSTOM PROCEDURE TRAY) ×2 IMPLANT
SHEATH PINNACLE 5F 10CM (SHEATH) ×2 IMPLANT
SYR MEDRAD MARK V 150ML (SYRINGE) ×2 IMPLANT
TRANSDUCER W/STOPCOCK (MISCELLANEOUS) ×2 IMPLANT
TUBING CIL FLEX 10 FLL-RA (TUBING) ×2 IMPLANT
WIRE EMERALD 3MM-J .035X150CM (WIRE) ×2 IMPLANT
WIRE HI TORQ VERSACORE-J 145CM (WIRE) ×2 IMPLANT
WIRE SAFE-T 1.5MM-J .035X260CM (WIRE) ×2 IMPLANT

## 2015-05-04 NOTE — Care Management Important Message (Signed)
Important Message  Patient Details  Name: [name] MRN: [id number] Date of Birth: 1945-11-27   Medicare Important Message Given:  Yes-third notification given    Lacretia Leigh, RN 05/04/2015, 10:44 AM

## 2015-05-04 NOTE — CV Procedure (Signed)
    Cardiac Catheterization Procedure Note  Name: [name] MRN: [id number] DOB: 12-Mar-1946  Procedure: Left Heart Cath, Selective Coronary Angiography, LV angiography  Indication:  Chest pain and abnormal stress test. Diagnostic cardiac catheterization 2 days early of was complicated by catheter-induced dissection of the right coronary artery. The rest of the procedure was not completed and this is a relook angiography.   Medications:  Sedation:  1 mg IV Versed, 75 mcg IV Fentanyl  Contrast:  90 ml Omnipaque  Procedural details: The right groin was prepped, draped, and anesthetized with 1% lidocaine. Using modified Seldinger technique, a 5 French sheath was introduced into the right femoral artery. Standard Judkins catheters were used for coronary angiography and left ventriculography. Catheter exchanges were performed over a guidewire. The sheath was removed and hemostasis was achieved with a Mynx closure device.  There were no immediate procedural complications. The patient was transferred to the post catheterization recovery area for further monitoring.   Procedural Findings:  Hemodynamics: AO:  150/60   mmHg LV:  163/7    mmHg LVEDP: 16  mmHg  Coronary angiography:  Coronary dominance:  Right   Left Main:  Normal  Left Anterior Descending (LAD):  Normal in size with mild calcifications. There is diffuse 20-30% disease proximally.  1st diagonal (D1):  Normal in size with 40% mid stenosis.  2nd diagonal (D2):  Normal in size with 40% ostial stenosis. This gives 3 medium-sized branches.   Circumflex (LCx):  Normal in size with minor irregularities.  1st obtuse marginal:  Normal  2nd obtuse marginal:  Absent  3rd obtuse marginal:   Small in size with minor irregularities.    Right Coronary Artery: Normal in size and mildly calcified. There is no evidence of dissection. There is 30-40% stenosis in the midsegment. There is 60% stenosis distally.   Posterior  descending artery: Normal in size with minor irregularities.   Posterior AV segment: Occluded.    Left ventriculography: Left ventricular systolic function is moderately reduced , LVEF is estimated at 35-40  %, there is no  significant mitral regurgitation   Final Conclusions:   1. 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 There is moderate stenosis distally with occluded AV segment as documented before. Otherwise, there is mild disease affecting the LAD. 2. Moderately reduced LV systolic function with an ejection fraction of 35-40% due to nonischemic cardiomyopathy. 3. Mild gradient across the aortic valve suggestive of mild stenosis. Mildly elevated left ventricular end-diastolic pressure.   Recommendations: No revascularization is recommended. Continue medical therapy for nonobstructive coronary artery disease and cardiomyopathy.    Kathlyn Sacramento MD, [facility] 05/04/2015, 5:06 PM

## 2015-05-04 NOTE — Progress Notes (Signed)
In to assess pt for qhs cpap. Refusing at this time. RN aware. Will continue to monitor.

## 2015-05-04 NOTE — Progress Notes (Signed)
Patient Name: [name] Date of Encounter: 05/04/2015  Principal Problem:   Chest pain at rest Active Problems:   DM (diabetes mellitus) with complications   Diabetic neuropathy   Hypertension   CAD- known RCA disease- medical Rx   Physical deconditioning   Hyperlipidemia LDL goal <70   Abnormal nuclear stress test   Primary Cardiologist: Dr Marlou Porch   Patient Profile: 69 yo male w/ hx d-RCA dz 90%, rx medically, LE DVT, CVA, DM, neuropathy, neurogenic bladder, OSA (no CPAP), nl EF w/ diast dysfunction, admitted w/ chest pain 08/20. Cath 08/22 w/ RCA dissection, med rx, EF 32%, L system needs cath.   SUBJECTIVE: Chest pain was relieved by morphine, no SOB, currently pain-free.  OBJECTIVE Filed Vitals:   05/04/15 0200 05/04/15 0339 05/04/15 0400 05/04/15 0600  BP: 123/39 125/44 122/42 141/53  Pulse: 53 59 55 67  Temp:  98.6 F (37 C)    TempSrc:  Oral    Resp: 14 17 16 20   Height:      Weight:  342 lb 13 oz (155.5 kg)    SpO2: 97% 97% 96% 97%    Intake/Output Summary (Last 24 hours) at 05/04/15 0719 Last data filed at 05/04/15 0600  Gross per 24 hour  Intake   2163 ml  Output   4650 ml  Net  -2487 ml   Filed Weights   05/02/15 0600 05/02/15 1600 05/04/15 0339  Weight: 342 lb 3.2 oz (155.221 kg) 344 lb 2.2 oz (156.1 kg) 342 lb 13 oz (155.5 kg)    PHYSICAL EXAM General: Well developed, well nourished, male in no acute distress. Head: Normocephalic, atraumatic.  Neck: Supple without bruits, JVD not elevated. Lungs:  Resp regular and unlabored, few rales bases. Heart: RRR, S1, S2, no S3, S4, 2-3/6 murmur apez; no rub. Abdomen: Soft, non-tender, non-distended, BS + x 4.  Extremities: No clubbing, cyanosis, no edema.  Neuro: Alert and oriented X 3. Moves all extremities spontaneously. Psych: Normal affect.  LABS: CBC: Recent Labs  05/03/15 0229  WBC 7.3  HGB 13.8  HCT 40.3  MCV 87.0  PLT 177   INR: Recent Labs  05/02/15 0601  INR [id number]    Basic Metabolic Panel: Recent Labs  05/03/15 0229  NA 134*  K 3.9  CL 102  CO2 27  GLUCOSE 165*  BUN 9  CREATININE 0.71  CALCIUM 8.3*   Cardiac Enzymes: Recent Labs  05/03/15 0919 05/03/15 1456 05/03/15 2108  TROPONINI 0.09* 0.12* 0.11*   BNP:  B NATRIURETIC PEPTIDE  Date/Time Value Ref Range Status  12/22/2014 11:30 PM 120.1* 0.0 - 100.0 pg/mL Final  09/14/2014 06:44 AM 95.8 0.0 - 100.0 pg/mL Final    Comment:    Please note change in reference range.    TELE:  SR, 1st deg AVB, occ non-conducted PACs, S brady to mid-40s during the night, ?2nd sleep apnea      Current Medications:  . aspirin EC  81 mg Oral q morning - 10a  . bivalirudin (ANGIOMAX) infusion 5 mg/mL (Cath Lab,ACS,PCI indication)  1.75 mg/kg/hr Intravenous Once  . Chlorhexidine Gluconate Cloth  6 each Topical Q0600  . enoxaparin (LOVENOX) injection  80 mg Subcutaneous Daily  . ezetimibe  10 mg Oral Daily  . furosemide  40 mg Oral Daily  . insulin aspart  0-15 Units Subcutaneous TID WC  . insulin aspart  0-5 Units Subcutaneous QHS  . insulin NPH Human  20 Units Subcutaneous BID AC &  HS  . lisinopril  5 mg Oral Daily  . loratadine  10 mg Oral Daily  . metoprolol  50 mg Oral BID  . mupirocin ointment  1 application Nasal BID  . nitroGLYCERIN  1 inch Topical 3 times per day  . pantoprazole  40 mg Oral Daily  . regadenoson  0.4 mg Intravenous Once  . sodium chloride  3 mL Intravenous Q12H    ASSESSMENT AND PLAN: Principal Problem:   Chest pain at rest - RCA dissection at initial cath being treated medically - LVD is new, needs LAD system assessment, ?today or tomorrow? - on ASA, BB, Zetia (intolerant of statins), plan for referral to clinic for PC-SK9 if does not tolerate Zetia - on NTG paste - SR, S brady so no further increase in BB - Morphine listed as allergy, tolerating it well at this time.    Ischemic CM - on Lasix - ACE allergy listed, tolerated 1 dose yesterday of lisinopril 5 mg,  d/c'd by IM - consider ARB    Sinus brady - HR mid-40s while asleep, ?2nd sleep apnea - for now, no BB dose change.  Otherwise, per IM Active Problems:   DM (diabetes mellitus) with complications   Diabetic neuropathy   Hypertension   CAD- known RCA disease- medical Rx   Physical deconditioning   Hyperlipidemia LDL goal <70   Abnormal nuclear stress test  Signed, Lenoard Aden 7:19 AM 05/04/2015   The patient was seen, examined and discussed with Rosaria Ferries, PA-C and I agree with the above.   69 year old male with known CAD, presented with typical chest pain and has abnormal stress test with reversible ischemia involving the mid and distal lateral wall and the proximal inferior wall. Hypokinesis involving the entire left ventricle with the exception of the anterior wall. Akinesis involving the upper septum at the base of the heart and decreased LVEF 32%.  We started Zetia as he didn't tolerate 3-4 statins in the past, if he doesn't tolerate zetia he will be referred to our lipid clinic for PC-SK 9 inhibitors.   He underwent left cardiac cath on 9/22 by Dr Gwenlyn Found, he was found to have moderate distal RCA disease with an occluded PLA system compared to his cath in 2013. He was unable to engage the left main and image the left system. Unfortunately, there was a catheter dissection of the origin of the RCA With staining that spiraled to the mid vessel. My intent was to stent this but it ultimately closed spontaneously maintaining a widely patent lumen and normal flow. The decision was made not to intervene. The patient did receive Angiomax bolus which was discontinued. The radial sheath was removed and a TR band was placed.  The patient ultimately will need to have the remainder was completed on his left system femorally, we will schedule for this afternoon.  He feels nauseous today, we will give him Zofran, nitro.  Dorothy Spark 05/04/2015

## 2015-05-05 ENCOUNTER — Encounter (HOSPITAL_COMMUNITY): Payer: Self-pay | Admitting: Cardiovascular Disease

## 2015-05-05 ENCOUNTER — Inpatient Hospital Stay (HOSPITAL_COMMUNITY): Payer: Medicare Other

## 2015-05-05 DIAGNOSIS — R55 Syncope and collapse: Secondary | ICD-10-CM

## 2015-05-05 LAB — GLUCOSE, CAPILLARY
GLUCOSE-CAPILLARY: 151 mg/dL — AB (ref 65–99)
GLUCOSE-CAPILLARY: 184 mg/dL — AB (ref 65–99)
Glucose-Capillary: 91 mg/dL (ref 65–99)
Glucose-Capillary: 125 mg/dL — ABNORMAL HIGH (ref 65–99)

## 2015-05-05 MED ORDER — LOSARTAN POTASSIUM 25 MG PO TABS
25.0000 mg | ORAL_TABLET | Freq: Every day | ORAL | Status: DC
  Administered 2015-05-05 – 2015-05-06 (×2): 25 mg via ORAL
  Filled 2015-05-05 (×2): qty 1

## 2015-05-05 MED ORDER — ISOSORBIDE MONONITRATE ER 30 MG PO TB24
30.0000 mg | ORAL_TABLET | Freq: Every day | ORAL | Status: DC
  Administered 2015-05-05 – 2015-05-06 (×2): 30 mg via ORAL
  Filled 2015-05-05 (×2): qty 1

## 2015-05-05 MED FILL — Midazolam HCl Inj 2 MG/2ML (Base Equivalent): INTRAMUSCULAR | Qty: 2 | Status: AC

## 2015-05-05 MED FILL — Fentanyl Citrate Preservative Free (PF) Inj 100 MCG/2ML: INTRAMUSCULAR | Qty: 2 | Status: AC

## 2015-05-05 MED FILL — Heparin Sodium (Porcine) 2 Unit/ML in Sodium Chloride 0.9%: INTRAMUSCULAR | Qty: 1000 | Status: AC

## 2015-05-05 MED FILL — Lidocaine HCl Local Preservative Free (PF) Inj 1%: INTRAMUSCULAR | Qty: 30 | Status: AC

## 2015-05-05 NOTE — Progress Notes (Signed)
Late entry:(05/04/2015) morning    During patient transfer from chair to bed, patient appeared to fall asleep. Sleep episodes lasted 10-15 seconds. No EKG changes were noted. No tremors or muscle rigidity noted. Pt easily aroused during both episodes. Will continue to monitor. PA made aware this AM (05/05/2015)

## 2015-05-05 NOTE — Progress Notes (Signed)
EEG Completed; Results Pending  

## 2015-05-05 NOTE — Progress Notes (Addendum)
Patient Name: [name] Date of Encounter: 05/05/2015  Principal Problem:   Chest pain at rest Active Problems:   DM (diabetes mellitus) with complications   Diabetic neuropathy   Hypertension   CAD- known RCA disease- medical Rx   Physical deconditioning   Hyperlipidemia LDL goal <70   Abnormal nuclear stress test   Primary Cardiologist: Dr Marlou Porch  Patient Profile: 69 yo male w/ hx d-RCA dz 90%, rx medically, LE DVT, CVA, DM, neuropathy, neurogenic bladder, OSA (no CPAP), nl EF w/ diast dysfunction, admitted w/ chest pain 08/20. Cath 08/22 w/ RCA dissection, med rx, EF 32%, cath 08/24 w/ L system non-obs dz, R dissection healed, EF 35-40%, prob mild AS   SUBJECTIVE: Relieved at cath results, wants to increase activity. Has had multiple falls 2nd syncope, each episode lasts a few seconds, no sequelae, no palpitations. Also has orthostatic symptoms, these are different. Has not seen neurology as OP, saw them in 2015 hospital stay. He quit driving because of them.   OBJECTIVE Filed Vitals:   05/05/15 0500 05/05/15 0600 05/05/15 0700 05/05/15 0744  BP: 139/63 130/108 119/51   Pulse: 57 74 57   Temp:    97 F (36.1 C)  TempSrc:    Oral  Resp: 14 15 12    Height:      Weight:      SpO2: 98% 84% 94%     Intake/Output Summary (Last 24 hours) at 05/05/15 0747 Last data filed at 05/05/15 0011  Gross per 24 hour  Intake 418.17 ml  Output   2450 ml  Net -2031.83 ml   Filed Weights   05/02/15 0600 05/02/15 1600 05/04/15 0339  Weight: 342 lb 3.2 oz (155.221 kg) 344 lb 2.2 oz (156.1 kg) 342 lb 13 oz (155.5 kg)    PHYSICAL EXAM General: Well developed, well nourished, male in no acute distress. Head: Normocephalic, atraumatic.  Neck: Supple without bruits, JVD minimal elevation. Lungs:  Resp regular and unlabored, slightly decreased BS bases. Heart: RRR, S1, S2, no S3, S4, soft murmur; no rub. Abdomen: Soft, non-tender, non-distended, BS + x 4.  Extremities:  No clubbing, cyanosis, edema. R groin cath site without ecchymosis, hematoma or bruit. Neuro: Alert and oriented X 3. Moves all extremities spontaneously. Psych: Normal affect.  LABS: CBC: Recent Labs  05/03/15 0229  WBC 7.3  HGB 13.8  HCT 40.3  MCV 87.0  PLT [id number]   Basic Metabolic Panel: Recent Labs  05/03/15 0229  NA 134*  K 3.9  CL 102  CO2 27  GLUCOSE 165*  BUN 9  CREATININE 0.71  CALCIUM 8.3*   Cardiac Enzymes: Recent Labs  05/03/15 0919 05/03/15 1456 05/03/15 2108  TROPONINI 0.09* 0.12* 0.11*   TELE: SR, S brady, mid-40s overnight. Occ PVCs and PACs  Current Medications:  . aspirin EC  81 mg Oral q morning - 10a  . Chlorhexidine Gluconate Cloth  6 each Topical Q0600  . enoxaparin (LOVENOX) injection  80 mg Subcutaneous Daily  . ezetimibe  10 mg Oral Daily  . furosemide  40 mg Oral Daily  . insulin aspart  0-15 Units Subcutaneous TID WC  . insulin aspart  0-5 Units Subcutaneous QHS  . insulin NPH Human  20 Units Subcutaneous BID AC & HS  . loratadine  10 mg Oral Daily  . metoprolol  50 mg Oral BID  . mupirocin ointment  1 application Nasal BID  . ondansetron (ZOFRAN) IV  8 mg Intravenous 4 times  per day  . pantoprazole  40 mg Oral Daily  . regadenoson  0.4 mg Intravenous Once  . sodium chloride  3 mL Intravenous Q12H  . sodium chloride  3 mL Intravenous Q12H   . nitroGLYCERIN 20 mcg/min (05/04/15 1000)    ASSESSMENT AND PLAN:  Principal Problem:  Chest pain at rest - RCA dissection at initial cath being treated medically; improved at relook - LVD is new, LAD system non-obs dz, therefore NICM - on ASA, BB, Zetia (intolerant of statins), plan for referral to clinic for PC-SK9 if does not tolerate Zetia - on NTG paste - SR, S brady so no further increase in BB - Morphine listed as allergy, tolerating it well at this time.   Non-Ischemic CM - on Lasix - ACE allergy listed, tolerated 1 dose yesterday of lisinopril 5 mg, d/c'd by IM - consider  ARB once BP stabilizes, currently SBP 90s-150s.   Sinus brady - HR mid-40s while asleep, ?2nd sleep apnea - no pauses > 2 sec, no HR sustained < 43. - for now, no BB dose change.     Syncope - Seen by neurology 12/2013 during hospitalization, no clear diagnosis listed.  - pt states has happened in the hospital this admit, he will suddenly stop responding and return in a few seconds. Not orthostatic in nature - as he has fallen multiple times, consider neuro eval for possible seizure activity. - will get PT to see.   Plan - tx telemetry, PT to see, begin to work towards d/c.  Otherwise, continue current therapy.  Principal Problem:   Chest pain at rest Active Problems:   DM (diabetes mellitus) with complications   Diabetic neuropathy   Hypertension   CAD- known RCA disease- medical Rx   Physical deconditioning   Hyperlipidemia LDL goal <70   Abnormal nuclear stress test  Signed, Lenoard Aden 7:47 AM 05/05/2015  The patient was seen, examined and discussed with Rosaria Ferries, PA-C and I agree with the above.   69 year old male with known CAD, presented with typical chest pain and has abnormal stress test with reversible ischemia involving the mid and distal lateral wall and the proximal inferior wall. Hypokinesis involving the entire left ventricle with the exception of the anterior wall. Akinesis involving the upper septum at the base of the heart and decreased LVEF 32%.  We started Zetia as he didn't tolerate 3-4 statins in the past, if he doesn't tolerate zetia he will be referred to our lipid clinic for PC-SK 9 inhibitors.   He underwent left cardiac cath on 9/22 by Dr Gwenlyn Found, he was found to have moderate distal RCA disease with an occluded PLA system compared to his cath in 2013, there was a catheter dissection of the origin of the RCA Relook yesterday showed improvement.  We will d/c NTG drip, start imdur 30 mg po daily and losartan 25 mg po daily.  Call  neurology for possible seizure activity. Start PT, transfer to tele.   Dorothy Spark 05/05/2015

## 2015-05-05 NOTE — Procedures (Signed)
ELECTROENCEPHALOGRAM REPORT  Patient: [name]       Room #: [id number] EEG No. ID: [id number] Age: 69 y.o.        Sex: male Referring Physician: Liane Comber Report Date:  05/05/2015        Interpreting Physician: Anthony Sar  History: [name] is an 69 y.o. male with a history of diabetes mellitus, hypertension and hyperlipidemia as well as recurrent is of loss of consciousness, thought to likely be syncopal episodes. Patient has had 2 previous EEGs which reportedly were nonrevealing.  Indications for study:  Rule out seizure disorder.  Technique: This is an 18 channel routine scalp EEG performed at the bedside with bipolar and monopolar montages arranged in accordance to the international 10/20 system of electrode placement.   Description: This EEG recording was performed during wakefulness and during sleep. Predominant activity during wakefulness consisted of 9  Hz alpha rhythm with good attenuation with eye opening. Photic stimulation was not performed.There was symmetrical slowing of background activity with mixed irregular delta and theta activity diffusely during sleep. Symmetrical vertex waves, sleep spindles and K-complexes are recorded during stage II of sleep. No epileptiform discharges were recorded. There was no abnormal slowing of cerebral activity.  Interpretation: This is a normal EEG recording during wakefulness during wakefulness and during sleep. No evidence of an epileptic disorder was demonstrated. However, a normal EEG recording in and of itself does not rule out seizure disorder.   Rush Farmer M.D. [facility] [phone]

## 2015-05-05 NOTE — Consult Note (Signed)
NEURO HOSPITALIST CONSULT NOTE   Referring physician: Meda Coffee   Reason for Consult: ? syncope  HPI:                                                                                                                                          [name] is an 69 y.o. male who has been evaluated for  multiple neurological complaints by neurology while in hospital in the past. All which have not revealed etiology for his complaints.  Patient has had 2 EEGs int he past for complaints of syncope.  Both EEGs were non revealing. Today patient tells me he has continued to have syncopal event while at home.  When asked how he feels when they occur, he states" I do not know, I do not know they are occuring?  When asked how he knows they occur, he states that people tell him.  When asked what people (as he lives alone) he states the nurses at the hospital." He states they only occur when standing.  He tells me that "the nurses have told him he stares off for a few seconds and then comes back".  I personally have witness one of these events in the past. While talking he started to stare off but when stimulated he quickly came to and had no confusion.   During last consultation for his syncopal spells Neurology recommended a Tilt table test and out patient EMU as all tests in the past have been unrevealing. Patient states he has not seen a neurologist as a out patient.   Carotid doppler (11-15-14)---Left ICA velocities nearly in the range of 40-59% stenosis 2D echo---LV EF: 50% -  55%  Past Medical History  Diagnosis Date  . Obese   . Hypertension   . Diabetic neuropathy, painful   . Chronic indwelling Foley catheter   . Bladder spasms   . Hyperlipidemia   . GERD (gastroesophageal reflux disease)   . Neurogenic bladder   . CAD (coronary artery disease)   . PONV (postoperative nausea and vomiting)   . CHF (congestive heart failure)   . DVT (deep venous thrombosis) 1980's    LLE   . Type II diabetes mellitus   . Diabetic diarrhea   . Pneumonia 1999  . OSA (obstructive sleep apnea)     "they wanted me to wear a mask; I couldn't" (11/10/2014)  . Arthritis     "hands, ankles, knees" (11/10/2014)  . Chronic lower back pain   . Stroke 10/2011; 06/2014    right hand numbness/notes 10/20/2011, pt not sure he had stroke in 2013; "right hand weaker and right face not quite right since" (11/10/2014)    Past Surgical History  Procedure Laterality Date  . Gastroplasty    . Total knee  arthroplasty Right 1990's  . Left heart catheterization with coronary angiogram N/A 10/24/2011    Procedure: LEFT HEART CATHETERIZATION WITH CORONARY ANGIOGRAM;  Surgeon: Candee Furbish, MD;  Location: [facility] CATH LAB;  Service: Cardiovascular;  Laterality: N/A;  right radial artery approach  . Cholecystectomy open  1970's?  . Joint replacement    . Carpal tunnel release Bilateral ~ 2003-2004  . Cardiac catheterization N/A 05/02/2015    Procedure: Left Heart Cath and Coronary Angiography;  Surgeon: Lorretta Harp, MD;  Location: [place] [facility] CV LAB;  Service: Cardiovascular;  Laterality: N/A;  . Cardiac catheterization N/A 05/04/2015    Procedure: Left Heart Cath and Coronary Angiography;  Surgeon: Wellington Hampshire, MD;  Location: [facility] CV LAB;  Service: Cardiovascular;  Laterality: N/A;    Family History  Problem Relation Age of Onset  . Hypertension Mother   . Diabetes type I Father   . Cancer Brother      Testicular Cancer  . Cancer Brother     Leukemia     Social History:  reports that he quit smoking about 38 years ago. His smoking use included Cigarettes. He has a 40 pack-year smoking history. He has never used smokeless tobacco. He reports that he does not drink alcohol or use illicit drugs.  Allergies  Allergen Reactions  . Ace Inhibitors Swelling    Pt tolerates lisinopril  . Lipitor [Atorvastatin Calcium] Swelling  . Metformin And Related Swelling  . Cefadroxil Hives  .  Cephalexin Hives  . Morphine And Related Other (See Comments)    Sweating, feels like is "in rocky boat."  . Robaxin [Methocarbamol] Other (See Comments)    Feels like he is shaky    MEDICATIONS:                                                                                                                     Prior to Admission:  Prescriptions prior to admission  Medication Sig Dispense Refill Last Dose  . acetaminophen (TYLENOL) 325 MG tablet Take 2 tablets (650 mg total) by mouth every 4 (four) hours as needed for headache or mild pain.   prn  . aspirin EC 81 MG tablet Take 81 mg by mouth every morning.    04/29/2015 at Unknown time  . calcium carbonate (TUMS - DOSED IN MG ELEMENTAL CALCIUM) 500 MG chewable tablet Chew 1 tablet (200 mg of elemental calcium total) by mouth 3 (three) times daily as needed for indigestion or heartburn.   prn  . cloNIDine (CATAPRES) 0.1 MG tablet Take 0.1 mg by mouth once.   04/29/2015  . diphenoxylate-atropine (LOMOTIL) 2.5-0.025 MG per tablet Take 1 tablet by mouth 3 (three) times daily as needed. For diarrhea 90 tablet 0 prn  . fexofenadine (ALLEGRA) 180 MG tablet Take 180 mg by mouth daily as needed for allergies or rhinitis.   prn  . furosemide (LASIX) 40 MG tablet Take 1 tablet (40 mg total) by mouth daily. 30 tablet 11 04/28/2015 at Unknown  time  . HYDROcodone-acetaminophen (NORCO/VICODIN) 5-325 MG per tablet Take 1 tablet by mouth every 6 (six) hours as needed for severe pain. 15 tablet 0 04/29/2015 at Unknown time  . insulin NPH Human (HUMULIN N) 100 UNIT/ML injection Inject 0.2 mLs (20 Units total) into the skin 2 (two) times daily at 8 am and 10 pm. 10 mL 11 04/29/2015 at Unknown time  . lisinopril (PRINIVIL,ZESTRIL) 5 MG tablet TAKE 1 TABLET (5 MG TOTAL) BY MOUTH DAILY. 90 tablet 0 04/29/2015 at Unknown time  . metoprolol (LOPRESSOR) 50 MG tablet Take 1 tablet (50 mg total) by mouth 2 (two) times daily.   04/29/2015 at 1800  . nitroGLYCERIN (NITROSTAT) 0.4  MG SL tablet Place 1 tablet (0.4 mg total) under the tongue every 5 (five) minutes as needed for chest pain. 25 tablet 2 prn  . NON FORMULARY Inhale 1 application into the lungs at bedtime. CPAP   04/29/2015 at Unknown time  . omeprazole (PRILOSEC) 20 MG capsule Take 20 mg by mouth daily.   04/28/2015 at Unknown time  . oxybutynin (DITROPAN) 5 MG tablet Take 5 mg by mouth daily as needed for bladder spasms.   prn  . Sennosides 25 MG TABS Take 25 mg by mouth daily as needed (for constipation).   prn  . nitrofurantoin, macrocrystal-monohydrate, (MACROBID) 100 MG capsule Take 1 capsule (100 mg total) by mouth 2 (two) times daily. (Patient not taking: Reported on 04/29/2015) 10 capsule 0 Completed Course at 04/23/15   Scheduled: . aspirin EC  81 mg Oral q morning - 10a  . Chlorhexidine Gluconate Cloth  6 each Topical Q0600  . enoxaparin (LOVENOX) injection  80 mg Subcutaneous Daily  . ezetimibe  10 mg Oral Daily  . furosemide  40 mg Oral Daily  . insulin aspart  0-15 Units Subcutaneous TID WC  . insulin aspart  0-5 Units Subcutaneous QHS  . insulin NPH Human  20 Units Subcutaneous BID AC & HS  . isosorbide mononitrate  30 mg Oral Daily  . loratadine  10 mg Oral Daily  . losartan  25 mg Oral Daily  . metoprolol  50 mg Oral BID  . mupirocin ointment  1 application Nasal BID  . ondansetron (ZOFRAN) IV  8 mg Intravenous 4 times per day  . pantoprazole  40 mg Oral Daily  . sodium chloride  3 mL Intravenous Q12H     ROS:                                                                                                                                       History obtained from the patient  General ROS: negative for - chills, fatigue, fever, night sweats, weight gain or weight loss Psychological ROS: negative for - behavioral disorder, hallucinations, memory difficulties, mood swings or suicidal ideation Ophthalmic ROS: negative for - blurry vision, double vision, eye pain or loss of vision ENT  ROS:  negative for - epistaxis, nasal discharge, oral lesions, sore throat, tinnitus or vertigo Allergy and Immunology ROS: negative for - hives or itchy/watery eyes Hematological and Lymphatic ROS: negative for - bleeding problems, bruising or swollen lymph nodes Endocrine ROS: negative for - galactorrhea, hair pattern changes, polydipsia/polyuria or temperature intolerance Respiratory ROS: negative for - cough, hemoptysis, shortness of breath or wheezing Cardiovascular ROS: negative for - chest pain, dyspnea on exertion, edema or irregular heartbeat Gastrointestinal ROS: negative for - abdominal pain, diarrhea, hematemesis, nausea/vomiting or stool incontinence Genito-Urinary ROS: negative for - dysuria, hematuria, incontinence or urinary frequency/urgency Musculoskeletal ROS: negative for - joint swelling or muscular weakness Neurological ROS: as noted in HPI Dermatological ROS: negative for rash and skin lesion changes   Blood pressure 128/57, pulse 63, temperature 97 F (36.1 C), temperature source Oral, resp. rate 13, height 6\' 1"  (1.854 m), weight 155.5 kg (342 lb 13 oz), SpO2 96 %.   Neurologic Examination:                                                                                                      HEENT-  Normocephalic, no lesions, without obvious abnormality.  Normal external eye and conjunctiva.  Normal TM's bilaterally.  Normal auditory canals and external ears. Normal external nose, mucus membranes and septum.  Normal pharynx. Cardiovascular- S1, S2 normal, pulses palpable throughout   Lungs- chest clear, no wheezing, rales, normal symmetric air entry Abdomen- normal findings: bowel sounds normal Extremities- no clubbing. Positive for bilateral LE edema Lymph-no adenopathy palpable Musculoskeletal-no joint tenderness, deformity or swelling Skin-warm and dry, no hyperpigmentation, vitiligo, or suspicious lesions  Neurological Examination Mental Status: Alert, oriented,  thought content appropriate.  Speech fluent without evidence of aphasia.  Able to follow 3 step commands without difficulty. Cranial Nerves: II: Discs flat bilaterally; Visual fields grossly normal, pupils equal, round, reactive to light and accommodation III,IV, VI: ptosis not present, extra-ocular motions intact bilaterally V,VII: smile symmetric, facial light touch sensation normal bilaterally VIII: hearing normal bilaterally IX,X: uvula rises symmetrically XI: bilateral shoulder shrug XII: midline tongue extension Motor: Right : Upper extremity   5/5    Left:     Upper extremity   5/5  Lower extremity   5/5     Lower extremity   5/5 Tone and bulk:normal tone throughout; no atrophy noted Sensory: decreased sensation in the right hand and bilateral legs below the knee.  Deep Tendon Reflexes: 1+ and symmetric throughout Plantars: Mute bilaterally Cerebellar: normal finger-to-nose and normal heel-to-shin test Gait: not tested due to safety      Lab Results: Basic Metabolic Panel:  Recent Labs Lab 04/29/15 1803 04/30/15 0238 05/03/15 0229  NA 135  --  134*  K 4.6  --  3.9  CL 101  --  102  CO2 27  --  27  GLUCOSE 352*  --  165*  BUN 12  --  9  CREATININE 0.72 0.75 0.71  CALCIUM 9.1  --  8.3*    Liver Function Tests:  Recent Labs Lab 04/29/15 1803  AST  19  ALT 14*  ALKPHOS 75  BILITOT 0.4  PROT 6.6  ALBUMIN 3.0*   No results for input(s): LIPASE, AMYLASE in the last 168 hours. No results for input(s): AMMONIA in the last 168 hours.  CBC:  Recent Labs Lab 04/29/15 1803 04/30/15 0238 05/03/15 0229  WBC 7.2 7.2 7.3  NEUTROABS 4.3  --   --   HGB 14.6 14.3 13.8  HCT 42.6 42.6 40.3  MCV 87.1 87.8 87.0  PLT 197 189 177    Cardiac Enzymes:  Recent Labs Lab 04/30/15 0515 04/30/15 0845 05/03/15 0919 05/03/15 1456 05/03/15 2108  TROPONINI <0.03 <0.03 0.09* 0.12* 0.11*    Lipid Panel:  Recent Labs Lab 04/30/15 1047  CHOL 163  TRIG 133  HDL  30*  CHOLHDL 5.4  VLDL 27  LDLCALC 106*    CBG:  Recent Labs Lab 05/03/15 2149 05/04/15 0749 05/04/15 1227 05/04/15 2207 05/05/15 0741  GLUCAP 183* 136* 186* 150* 91    Microbiology: Results for orders placed or performed during the hospital encounter of 04/29/15  MRSA PCR Screening     Status: Abnormal   Collection Time: 04/30/15  1:58 AM  Result Value Ref Range Status   MRSA by PCR POSITIVE (A) NEGATIVE Final    Comment:        The GeneXpert MRSA Assay (FDA approved for NASAL specimens only), is one component of a comprehensive MRSA colonization surveillance program. It is not intended to diagnose MRSA infection nor to guide or monitor treatment for MRSA infections. RESULT CALLED TO, READ BACK BY AND VERIFIED WITH: [place] [phone] GREEN R     Coagulation Studies: No results for input(s): LABPROT, INR in the last 72 hours.  Imaging: No results found.     Assessment and plan per attending neurologist  Etta Quill PA-C [facility] [phone]  05/05/2015, 12:01 PM   Assessment/Plan:  69 yo M with recurrent episodes of breif LOC, more often with standing. He was mildly orthostatic by HR, but not by BP. Of note, he states that he had one of his events with this.   The events do not sound suspicious for seizure. To prove this definitively, an ambulatory EEG could be performed as an outpatient or an EMU stay could be pursued.   1) Would consider ambulatory monitoring as outpatient  Roland Rack, MD [facility] [phone]  If 7pm- 7am, please page neurology on call as listed in [place].

## 2015-05-05 NOTE — Evaluation (Signed)
Physical Therapy Evaluation Patient Details Name: [name] MRN: [id number] DOB: Mar 23, 1946 Today's Date: 05/05/2015   History of Present Illness  Pt adm from SNF with chest pain. Cardiac cath revealed nonobstructive CAD to be treated medically. PMH - frequent syncopal type episodes, lt shoulder pain, DM, HTN, neuropathy, CVA, CHF, CAD, morbid obesity  Clinical Impression  Pt admitted with above diagnosis and presents to PT with functional limitations due to deficits listed below (See PT problem list). Pt needs skilled PT to maximize independence and safety to allow discharge to return to SNF. Pt with brief (3-5 seconds) unresponsive episode sitting EOB. Orthostatic vitals taken and pt not orthostatic.     Follow Up Recommendations SNF    Equipment Recommendations  None recommended by PT    Recommendations for Other Services       Precautions / Restrictions Precautions Precautions: Fall;Other (comment) (pt with history of brief episodes of unresponsiveness)      Mobility  Bed Mobility Overal bed mobility: Needs Assistance Bed Mobility: Supine to Sit;Sit to Supine     Supine to sit: Min assist Sit to supine: +2 for physical assistance;Mod assist   General bed mobility comments: Assist to bring trunk up into sitting. Assist to control descent of trunk and to bring legs back up into bed.  Transfers Overall transfer level: Needs assistance Equipment used: Rolling walker (2 wheeled) Transfers: Sit to/from Stand Sit to Stand: +2 physical assistance;Mod assist         General transfer comment: Assist to bring hips up.  Ambulation/Gait                Stairs            Wheelchair Mobility    Modified Rankin (Stroke Patients Only)       Balance Overall balance assessment: Needs assistance Sitting-balance support: No upper extremity supported;Feet supported Sitting balance-Leahy Scale: Good     Standing balance support: Bilateral upper extremity  supported Standing balance-Leahy Scale: Poor Standing balance comment: walker and +2 min A to maintain standing                             Pertinent Vitals/Pain Pain Assessment: Faces Faces Pain Scale: Hurts even more Pain Location: lt shoulder Pain Descriptors / Indicators: Grimacing;Guarding Pain Intervention(s): Limited activity within patient's tolerance;Repositioned    Home Living Family/patient expects to be discharged to:: Skilled nursing facility Living Arrangements: Alone   Type of Home: House Home Access: Stairs to enter Entrance Stairs-Rails: Psychiatric nurse of Steps: 1 Home Layout: One level Home Equipment: Cane - single point;Grab bars - toilet;Grab bars - tub/shower;Electric scooter;Wheelchair - [employer] - 2 wheels;Walker - standard      Prior Function Level of Independence: Needs assistance   Gait / Transfers Assistance Needed: Amb with therapy at SNF            Hand Dominance   Dominant Hand: Right    Extremity/Trunk Assessment   Upper Extremity Assessment: Defer to OT evaluation           Lower Extremity Assessment: Generalized weakness         Communication   Communication: No difficulties  Cognition Arousal/Alertness: Awake/alert Behavior During Therapy: WFL for tasks assessed/performed Overall Cognitive Status: Within Functional Limits for tasks assessed                      General Comments  Exercises        Assessment/Plan    PT Assessment Patient needs continued PT services  PT Diagnosis Difficulty walking;Generalized weakness   PT Problem List Decreased strength;Decreased activity tolerance;Decreased balance;Decreased mobility;Obesity  PT Treatment Interventions DME instruction;Gait training;Functional mobility training;Therapeutic activities;Patient/family education;Balance training;Therapeutic exercise   PT Goals (Current goals can be found in the Care Plan section) Acute  Rehab PT Goals Patient Stated Goal: return home PT Goal Formulation: With patient Time For Goal Achievement: 05/19/15 Potential to Achieve Goals: Fair    Frequency Min 2X/week   Barriers to discharge        Co-evaluation               End of Session   Activity Tolerance: Patient limited by fatigue Patient left: in bed;with call bell/phone within reach;with bed alarm set Nurse Communication: Other (comment) (obtained orthostatics)         Time: [id number] PT Time Calculation (min) (ACUTE ONLY): 15 min   Charges:   PT Evaluation $Initial PT Evaluation Tier I: 1 Procedure     PT G Codes:        Ayanna Gheen 2015-05-18, 5:31 PM  [facility] PT [phone]

## 2015-05-05 NOTE — Clinical Social Work Note (Signed)
Clinical Social Work Assessment  Patient Details  Name: [name] MRN: [id number] Date of Birth: 23-Feb-1946  Date of referral:  05/05/15               Reason for consult:  Facility Placement                Permission sought to share information with:  Chartered certified accountant granted to share information::  Yes, Verbal Permission Granted  Name::        Agency::  [facility]  Relationship::     Contact Information:     Housing/Transportation Living arrangements for the past 2 months:  [place] of Information:  Patient Patient Interpreter Needed:    Criminal Activity/Legal Involvement Pertinent to Current Situation/Hospitalization:  No - Comment as needed Significant Relationships:  Friend Lives with:  Self, Facility Resident Do you feel safe going back to the place where you live?  Yes Need for family participation in patient care:  No (Coment)  Care giving concerns:  [name] notified pt admitted from facility   Social Worker assessment / plan:  [name] visited pt room and discuss admission from SNF. Pt informed [name] he has been at [facility] for around a month now. Pt informed [name] that he is currently unsure if he wants to return. [name] asked pt to clarify his concerns with facility. After discussing further, [name] offered pt multiple options including [name] speaking with facility about pt concerns and [name] sending pt referral to other facilities. Pt informed [name] that other facilities would not be an option. Pt agreeable to speak with facility liaison about issues but reported to [name] that he would prefer to dc home right now. [name] asked pt who he would dc home with. Pt states he lives at home but has a friend that could help. However, pt did later inform [name] that his friend works in [place] is not always in [place] or at home. [name] strongly encouraged pt to reconsider plan for home as this seems as though it  would be very unsafe given pt condition and limited mobility. Pt is willing to consider SNF but will not confirm decision with [name] at this time. [name] asked pt nurse to notify MD that pt would benefit from SNF discussion from MD as well. Pt would benefit from hearing concerns from home from MD as well. [name] and facility liaison did also speak with pt together. Pt spoke about roommate issues which facility confirmed will not be an issue on return as pt roommate has been discharged from the facility. Pt also expressed to [name] that he did not feel he was getting enough at facility. Facility liaison informed pt that if pt is wanting to be worked more with therapy this can be arranged. Pt did admit to [name] and liaison that prior to hospitalization he was a "difficult resident" in that he often refused to participate. Pt has confirmed that his attitude has changed since admission though.  Employment status:    Insurance information:  Medicare PT Recommendations:  [place] / Referral to community resources:     Patient/Family's Response to care:  Pt unsure if he will dc back to SNF. He is agreeable to thinking about option further.  Patient/Family's Understanding of and Emotional Response to Diagnosis, Current Treatment, and Prognosis:  Pt has a good understanding of his medication condition. However, pt expectations for dc home alone do not match with pt  current mobility so question pt full understanding of treatment. Pt with appropriate emotional response and in good spirits during [name] visit.  Emotional Assessment Appearance:  Appears stated age Attitude/Demeanor/Rapport:  Other (Cooperative) Affect (typically observed):  Calm Orientation:  Oriented to Self, Oriented to Place, Oriented to  Time Alcohol / Substance use:  Not Applicable Psych involvement (Current and /or in the community):  No (Comment)  Discharge Needs  Concerns to be addressed:  Discharge Planning  Concerns Readmission within the last 30 days:  Yes Current discharge risk:  Dependent with Mobility Barriers to Discharge:  Continued Medical Work up   [employer], [place]

## 2015-05-06 DIAGNOSIS — N319 Neuromuscular dysfunction of bladder, unspecified: Secondary | ICD-10-CM | POA: Diagnosis not present

## 2015-05-06 DIAGNOSIS — I5032 Chronic diastolic (congestive) heart failure: Secondary | ICD-10-CM | POA: Diagnosis not present

## 2015-05-06 DIAGNOSIS — K5900 Constipation, unspecified: Secondary | ICD-10-CM | POA: Diagnosis not present

## 2015-05-06 DIAGNOSIS — R109 Unspecified abdominal pain: Secondary | ICD-10-CM | POA: Diagnosis not present

## 2015-05-06 DIAGNOSIS — M545 Low back pain: Secondary | ICD-10-CM | POA: Diagnosis not present

## 2015-05-06 DIAGNOSIS — Z6841 Body Mass Index (BMI) 40.0 and over, adult: Secondary | ICD-10-CM | POA: Diagnosis not present

## 2015-05-06 DIAGNOSIS — I350 Nonrheumatic aortic (valve) stenosis: Secondary | ICD-10-CM | POA: Diagnosis not present

## 2015-05-06 DIAGNOSIS — G8929 Other chronic pain: Secondary | ICD-10-CM | POA: Diagnosis not present

## 2015-05-06 DIAGNOSIS — M1990 Unspecified osteoarthritis, unspecified site: Secondary | ICD-10-CM | POA: Diagnosis not present

## 2015-05-06 DIAGNOSIS — Z96651 Presence of right artificial knee joint: Secondary | ICD-10-CM | POA: Diagnosis not present

## 2015-05-06 DIAGNOSIS — I200 Unstable angina: Secondary | ICD-10-CM

## 2015-05-06 DIAGNOSIS — E118 Type 2 diabetes mellitus with unspecified complications: Secondary | ICD-10-CM | POA: Diagnosis not present

## 2015-05-06 DIAGNOSIS — I10 Essential (primary) hypertension: Secondary | ICD-10-CM | POA: Diagnosis not present

## 2015-05-06 DIAGNOSIS — I25110 Atherosclerotic heart disease of native coronary artery with unstable angina pectoris: Secondary | ICD-10-CM | POA: Diagnosis not present

## 2015-05-06 DIAGNOSIS — Z23 Encounter for immunization: Secondary | ICD-10-CM | POA: Diagnosis not present

## 2015-05-06 DIAGNOSIS — K219 Gastro-esophageal reflux disease without esophagitis: Secondary | ICD-10-CM | POA: Diagnosis not present

## 2015-05-06 DIAGNOSIS — Z87448 Personal history of other diseases of urinary system: Secondary | ICD-10-CM | POA: Diagnosis not present

## 2015-05-06 DIAGNOSIS — Z8701 Personal history of pneumonia (recurrent): Secondary | ICD-10-CM | POA: Diagnosis not present

## 2015-05-06 DIAGNOSIS — I209 Angina pectoris, unspecified: Secondary | ICD-10-CM | POA: Diagnosis not present

## 2015-05-06 DIAGNOSIS — I69351 Hemiplegia and hemiparesis following cerebral infarction affecting right dominant side: Secondary | ICD-10-CM | POA: Diagnosis not present

## 2015-05-06 DIAGNOSIS — R1084 Generalized abdominal pain: Secondary | ICD-10-CM | POA: Diagnosis present

## 2015-05-06 DIAGNOSIS — N320 Bladder-neck obstruction: Secondary | ICD-10-CM | POA: Diagnosis not present

## 2015-05-06 DIAGNOSIS — M6281 Muscle weakness (generalized): Secondary | ICD-10-CM | POA: Diagnosis not present

## 2015-05-06 DIAGNOSIS — I447 Left bundle-branch block, unspecified: Secondary | ICD-10-CM | POA: Diagnosis not present

## 2015-05-06 DIAGNOSIS — R011 Cardiac murmur, unspecified: Secondary | ICD-10-CM | POA: Diagnosis not present

## 2015-05-06 DIAGNOSIS — E1159 Type 2 diabetes mellitus with other circulatory complications: Secondary | ICD-10-CM | POA: Diagnosis not present

## 2015-05-06 DIAGNOSIS — L03116 Cellulitis of left lower limb: Secondary | ICD-10-CM | POA: Diagnosis not present

## 2015-05-06 DIAGNOSIS — E1169 Type 2 diabetes mellitus with other specified complication: Secondary | ICD-10-CM | POA: Diagnosis not present

## 2015-05-06 DIAGNOSIS — R079 Chest pain, unspecified: Secondary | ICD-10-CM | POA: Diagnosis present

## 2015-05-06 DIAGNOSIS — E1140 Type 2 diabetes mellitus with diabetic neuropathy, unspecified: Secondary | ICD-10-CM | POA: Diagnosis not present

## 2015-05-06 DIAGNOSIS — I441 Atrioventricular block, second degree: Secondary | ICD-10-CM | POA: Diagnosis not present

## 2015-05-06 DIAGNOSIS — R569 Unspecified convulsions: Secondary | ICD-10-CM | POA: Diagnosis not present

## 2015-05-06 DIAGNOSIS — I509 Heart failure, unspecified: Secondary | ICD-10-CM | POA: Diagnosis not present

## 2015-05-06 DIAGNOSIS — Z792 Long term (current) use of antibiotics: Secondary | ICD-10-CM | POA: Diagnosis not present

## 2015-05-06 DIAGNOSIS — Y840 Cardiac catheterization as the cause of abnormal reaction of the patient, or of later complication, without mention of misadventure at the time of the procedure: Secondary | ICD-10-CM | POA: Diagnosis not present

## 2015-05-06 DIAGNOSIS — I25759 Atherosclerosis of native coronary artery of transplanted heart with unspecified angina pectoris: Secondary | ICD-10-CM | POA: Diagnosis not present

## 2015-05-06 DIAGNOSIS — Z8673 Personal history of transient ischemic attack (TIA), and cerebral infarction without residual deficits: Secondary | ICD-10-CM | POA: Diagnosis not present

## 2015-05-06 DIAGNOSIS — R5381 Other malaise: Secondary | ICD-10-CM | POA: Diagnosis not present

## 2015-05-06 DIAGNOSIS — Z794 Long term (current) use of insulin: Secondary | ICD-10-CM | POA: Diagnosis not present

## 2015-05-06 DIAGNOSIS — Z86718 Personal history of other venous thrombosis and embolism: Secondary | ICD-10-CM | POA: Diagnosis not present

## 2015-05-06 DIAGNOSIS — R0789 Other chest pain: Secondary | ICD-10-CM | POA: Diagnosis not present

## 2015-05-06 DIAGNOSIS — Z79899 Other long term (current) drug therapy: Secondary | ICD-10-CM | POA: Diagnosis not present

## 2015-05-06 DIAGNOSIS — E669 Obesity, unspecified: Secondary | ICD-10-CM | POA: Diagnosis not present

## 2015-05-06 DIAGNOSIS — R55 Syncope and collapse: Secondary | ICD-10-CM | POA: Diagnosis not present

## 2015-05-06 DIAGNOSIS — R931 Abnormal findings on diagnostic imaging of heart and coronary circulation: Secondary | ICD-10-CM | POA: Diagnosis not present

## 2015-05-06 DIAGNOSIS — G4733 Obstructive sleep apnea (adult) (pediatric): Secondary | ICD-10-CM | POA: Diagnosis not present

## 2015-05-06 DIAGNOSIS — K6289 Other specified diseases of anus and rectum: Secondary | ICD-10-CM | POA: Diagnosis not present

## 2015-05-06 DIAGNOSIS — N390 Urinary tract infection, site not specified: Secondary | ICD-10-CM | POA: Diagnosis not present

## 2015-05-06 DIAGNOSIS — I2510 Atherosclerotic heart disease of native coronary artery without angina pectoris: Secondary | ICD-10-CM | POA: Diagnosis not present

## 2015-05-06 DIAGNOSIS — I25119 Atherosclerotic heart disease of native coronary artery with unspecified angina pectoris: Secondary | ICD-10-CM | POA: Diagnosis not present

## 2015-05-06 DIAGNOSIS — R1030 Lower abdominal pain, unspecified: Secondary | ICD-10-CM | POA: Diagnosis not present

## 2015-05-06 DIAGNOSIS — Z7982 Long term (current) use of aspirin: Secondary | ICD-10-CM | POA: Diagnosis not present

## 2015-05-06 DIAGNOSIS — E785 Hyperlipidemia, unspecified: Secondary | ICD-10-CM | POA: Diagnosis not present

## 2015-05-06 DIAGNOSIS — Z87891 Personal history of nicotine dependence: Secondary | ICD-10-CM | POA: Diagnosis not present

## 2015-05-06 DIAGNOSIS — I9751 Accidental puncture and laceration of a circulatory system organ or structure during a circulatory system procedure: Secondary | ICD-10-CM | POA: Diagnosis not present

## 2015-05-06 DIAGNOSIS — R404 Transient alteration of awareness: Secondary | ICD-10-CM

## 2015-05-06 DIAGNOSIS — I255 Ischemic cardiomyopathy: Secondary | ICD-10-CM | POA: Diagnosis not present

## 2015-05-06 DIAGNOSIS — E119 Type 2 diabetes mellitus without complications: Secondary | ICD-10-CM | POA: Diagnosis not present

## 2015-05-06 LAB — GLUCOSE, CAPILLARY
GLUCOSE-CAPILLARY: 128 mg/dL — AB (ref 65–99)
GLUCOSE-CAPILLARY: 170 mg/dL — AB (ref 65–99)

## 2015-05-06 MED ORDER — HYDROCODONE-ACETAMINOPHEN 5-325 MG PO TABS: 1.0000 | ORAL_TABLET | Freq: Four times a day (QID) | ORAL | Status: DC | PRN

## 2015-05-06 MED ORDER — ISOSORBIDE MONONITRATE ER 30 MG PO TB24: 30.0000 mg | ORAL_TABLET | Freq: Every day | ORAL | Status: DC

## 2015-05-06 MED ORDER — LOSARTAN POTASSIUM 25 MG PO TABS: 25.0000 mg | ORAL_TABLET | Freq: Every day | ORAL | Status: DC

## 2015-05-06 MED ORDER — EZETIMIBE 10 MG PO TABS: 10.0000 mg | ORAL_TABLET | Freq: Every day | ORAL | Status: DC

## 2015-05-06 MED ORDER — INFLUENZA VAC SPLIT QUAD 0.5 ML IM SUSY
0.5000 mL | PREFILLED_SYRINGE | Freq: Once | INTRAMUSCULAR | Status: AC
  Administered 2015-05-06: 0.5 mL via INTRAMUSCULAR
  Filled 2015-05-06: qty 0.5

## 2015-05-06 MED ORDER — INFLUENZA VAC SPLIT QUAD 0.5 ML IM SUSY: 0.5000 mL | PREFILLED_SYRINGE | INTRAMUSCULAR | Status: DC

## 2015-05-06 NOTE — Consult Note (Signed)
   [facility] CM Inpatient Consult   05/06/2015  [name] February 20, 1946 [id number] Patient evaluated for community based chronic disease management services with [facility] as a benefit of patient's Medicare Insurance. Spoke with patient at bedside to explain [facility] services.  Patient endorses that his primary care provider is [name].  Patient states he realizes he is in a cycle lately with his health management.  Consent form signed for [facility] and folder given with contact information. Patient will receive post discharge transition of care follow up and will be evaluated for monthly home visits for assessments and disease process education when he returns home.  Left contact information and [facility] literature at bedside. Made Inpatient Case Manager aware that [facility] following. Of note, [facility] Care Management services does not replace or interfere with any services that are arranged by inpatient case management or social work.  For additional questions or referrals please contact:   [name], RN BSN [facility] Liaison  [phone] business mobile phone

## 2015-05-06 NOTE — Discharge Summary (Signed)
CARDIOLOGY DISCHARGE SUMMARY   Patient ID: [name] MRN: [id number] DOB/AGE: 1945/11/12 69 y.o.  Admit date: 04/29/2015 Discharge date: 05/06/2015  PCP: Irven Shelling, MD Primary Cardiologist: Dr Marlou Porch  Primary Discharge Diagnosis:    Unstable angina Secondary Discharge Diagnosis:    DM (diabetes mellitus) with complications   Diabetic neuropathy   Hypertension   CAD- known RCA disease- medical Rx   Gait abnormality   Physical deconditioning   Unresponsive episode- suspected abscence seizures   Chest pain at rest   Hyperlipidemia LDL goal <70   Abnormal nuclear stress test  Consults: IM, Neurology, Physical Therapy  Procedures: Lexiscan MV, cardiac cath, coronary arteriogram, left ventriculogram, EEG, CT of the head without contrast, shoulder x-ray  Hospital Course: [name] is a 69 y.o. male with a history of 90% RCA treated medically, lower extremity DVT, CVA, diabetes, neuropathy, neurogenic bladder, OSA not on Cipro, diastolic dysfunction and physical deconditioning. He was in [facility] rehabilitation since 04/18/2015 where he had been discharged after a UTI.   He was awakened by chest pain. He came to the hospital and was admitted for further evaluation and treatment.  Cardiac enzymes were mildly elevated. A stress test was performed which showed reversible ischemia as well as new left ventricular dysfunction. He was scheduled for cardiac catheterization.  Cardiac catheterization was performed on 05/04/2015. Results are below. The AV segment of the RCA was occluded and medical therapy is recommended. The previous RCA dissection had improved. There was no other critical disease. His EF was 35-40 percent and this is felt due to a nonischemic cardiomyopathy. Medical therapy for this was recommended with aggressive cardiac risk factor reduction.  [name] is a diabetic and was managed with a combination of NPH insulin and sliding scale. An A1c was  performed which was elevated at 9.3. Compliance with a diabetic diet and medications is recommended as well as follow-up with her primary M.D.  He has a history of OSA and was offered C Pap by respiratory therapy at night. He consistently refused. He was also noted to have some overnight bradycardia with heart rates occasionally in the 40s. He was asymptomatic with this and no dose change was recommended in his beta blocker.  A lipid profile is below, he had been intolerant of statins. He is continued on Zetia and will be referred to the clinic for PC-SK9 if does not tolerate Zetia.   For his left ventricular dysfunction, nonischemic cardiomyopathy, he was on Lasix and a beta blocker. As his blood pressure improved, he was started on an ARB as ACE inhibitor is listed as an allergy.  [name] described episodes of syncope. He stated he had fallen multiple times at home. He had one in the hospital which the nurse described as an episode of decreased level of consciousness that resolved without sequela in approximately 15 seconds. He had been evaluated for this by neurology in the past, and not followed up with them. Because of the multiple falls, a neurology consult and a PT consult will call.  He was seen by neurology and orthostatic vital signs were performed. He had a mild increase in his heart rate but no change in his blood pressure with position changes. He was asymptomatic with this. An EEG was performed which did not show any significant abnormalities. A CT was also performed, results below and had no significant abnormalities. Neurology felt ambulatory EEG or EMU could be performed as an outpatient. [name] is  requested to follow up with neurology as an outpatient.  Physical therapy worked with [name] and recommended skilled nursing facility placement. He is reluctant to do this but is willing to go back to [facility] for rehabilitation.  Labs:   Lab Results  Component Value Date     WBC 7.3 05/03/2015   HGB 13.8 05/03/2015   HCT 40.3 05/03/2015   MCV 87.0 05/03/2015   PLT 177 05/03/2015    Recent Labs Lab 04/29/15 1803  05/03/15 0229  NA 135  --  134*  K 4.6  --  3.9  CL 101  --  102  CO2 27  --  27  BUN 12  --  9  CREATININE 0.72  < > 0.71  CALCIUM 9.1  --  8.3*  PROT 6.6  --   --   BILITOT 0.4  --   --   ALKPHOS 75  --   --   ALT 14*  --   --   AST 19  --   --   GLUCOSE 352*  --  165*  < > = values in this interval not displayed.  Recent Labs  05/03/15 1456 05/03/15 2108  TROPONINI 0.12* 0.11*   Lipid Panel     Component Value Date/Time   CHOL 163 04/30/2015 1047   TRIG 133 04/30/2015 1047   HDL 30* 04/30/2015 1047   CHOLHDL 5.4 04/30/2015 1047   VLDL 27 04/30/2015 1047   LDLCALC 106* 04/30/2015 1047   Lab Results  Component Value Date   HGBA1C 9.3* 04/30/2015     Radiology: Dg Chest 2 View 04/29/2015   CLINICAL DATA:  Chest pain. Symptoms for 2 days. Initial encounter.  EXAM: CHEST  2 VIEW  COMPARISON:  12/22/2014.  FINDINGS: Cardiopericardial silhouette within normal limits. Mediastinal contours normal. Trachea midline. No airspace disease or effusion. Monitoring leads project over the chest.  IMPRESSION: No active cardiopulmonary disease.   Electronically Signed   By: Dereck Ligas M.D.   On: 04/29/2015 17:55   Ct Head Wo Contrast 04/14/2015   CLINICAL DATA:  Altered mental status.  Urinary tract infection.  EXAM: CT HEAD WITHOUT CONTRAST  TECHNIQUE: Contiguous axial images were obtained from the base of the skull through the vertex without intravenous contrast.  COMPARISON:  Head MRI and CT 11/15/2014  FINDINGS: Symmetric mineralization is again seen in the basal ganglia and thalami bilaterally. Ventricles and sulci are within normal limits for age. Chronic lacunar infarct is again seen in the left caudate. There is no evidence of acute cortical infarct, intracranial hemorrhage, mass, midline shift, or extra-axial fluid collection.   Orbits are unremarkable. The visualized paranasal sinuses and mastoid air cells are clear.  IMPRESSION: No evidence of acute intracranial abnormality.   Electronically Signed   By: Logan Bores   On: 04/14/2015 14:59   Nm Myocar Multi W/spect W/wall Motion / Ef 05/01/2015   CLINICAL DATA:  Six 4-year-old with current history of hypertension, hyperlipidemia, chronic diastolic heart failure, mild aortic stenosis and minimal mitral regurgitation. Prior cardiac catheterization in February, 2013 demonstrating right coronary artery disease with 90% long segment PDA branch stenosis and proximal 50% stenosis of a large 1st diagonal. Patient presented 04/29/2015 with acute chest pain radiating into the left arm.  EXAM: MYOCARDIAL IMAGING WITH SPECT (REST AND PHARMACOLOGIC-STRESS - 2 DAY PROTOCOL)  GATED LEFT VENTRICULAR WALL MOTION STUDY  LEFT VENTRICULAR EJECTION FRACTION  TECHNIQUE: Standard myocardial SPECT imaging was performed after resting intravenous injection of  32.3 mCi Tc-43m sestamibi. On a different day, intravenous infusion of Lexiscan was performed under the supervision of the Cardiology staff. At peak effect of the drug, 31.3 mCi Tc-78m sestamibi was injected intravenously and standard myocardial SPECT imaging was performed. Quantitative gated imaging was also performed to evaluate left ventricular wall motion, and estimate left ventricular ejection fraction.  COMPARISON:  None.  FINDINGS: Subdiaphragmatic activity is present adjacent to the heart on the resting images.  Perfusion: Severe perfusion defects involving small to moderate-sized apical, anteroapical and apicoseptal segments. Severe perfusion defect involving a large segment of the entire lateral wall. Moderate perfusion defect involving a large segment of the entire inferior wall. Normal perfusion is identified only involving the anterior wall. Reversibility involving the mid and distal lateral wall and the proximal inferior wall.  Wall Motion:  Hypokinesis involving the entire left ventricle with the exception of the anterior wall. Akinesis involving the upper septum at the base of the heart.  Left Ventricular Ejection Fraction: [id number]  End diastolic volume [id number] ml  End systolic volume 123456 ml  IMPRESSION: 1. Reversible ischemia involving the mid and distal lateral wall and the proximal inferior wall.  2. Hypokinesis involving the entire left ventricle with the exception of the anterior wall. Akinesis involving the upper septum at the base of the heart.  3. Left ventricular ejection fraction 32%  4. High-risk stress test findings*.  *2012 Appropriate Use Criteria for Coronary Revascularization Focused Update: J Am Coll Cardiol. [id number]. http://content.airportbarriers.com.aspx?articleid=1201161   Electronically Signed   By: Evangeline Dakin M.D.   On: 05/01/2015 12:26   Dg Shoulder Left 04/15/2015   CLINICAL DATA:  Acute left shoulder pain concentrated at lateral humeral head. No known injury.  EXAM: LEFT SHOULDER - 2+ VIEW  COMPARISON:  None.  FINDINGS: Mild degenerative changes in the left [facility] joint with joint space narrowing and spurring. No acute bony abnormality. Specifically, no fracture, subluxation, or dislocation. Soft tissues are intact.  IMPRESSION: No acute bony abnormality.   Electronically Signed   By: Rolm Baptise M.D.   On: 04/15/2015 11:51    Cardiac Cath: 05/04/2015 Coronary angiography:  Coronary dominance: Right   Left Main: Normal  Left Anterior Descending (LAD): Normal in size with mild calcifications. There is diffuse 20-30% disease proximally.  1st diagonal (D1): Normal in size with 40% mid stenosis.  2nd diagonal (D2): Normal in size with 40% ostial stenosis. This gives 3 medium-sized branches.  Circumflex (LCx): Normal in size with minor irregularities.  1st obtuse marginal: Normal  2nd obtuse marginal: Absent  3rd obtuse marginal: Small in size with minor irregularities.  Right  Coronary Artery: Normal in size and mildly calcified. There is no evidence of dissection. There is 30-40% stenosis in the midsegment. There is 60% stenosis distally.   Posterior descending artery: Normal in size with minor irregularities.   Posterior AV segment: Occluded. Left ventriculography: Left ventricular systolic function is moderately reduced , LVEF is estimated at 35-40 %, there is no significant mitral regurgitation  Final Conclusions:  1. 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 There is moderate stenosis distally with occluded AV segment as documented before. Otherwise, there is mild disease affecting the LAD. 2. Moderately reduced LV systolic function with an ejection fraction of 35-40% due to nonischemic cardiomyopathy. 3. Mild gradient across the aortic valve suggestive of mild stenosis. Mildly elevated left ventricular end-diastolic pressure. Recommendations: No revascularization is recommended. Continue medical therapy for  nonobstructive coronary artery disease and cardiomyopathy.   EKG: 05/05/2015 S [place], HR 58 w/ 1st deg AVB, PR 308 ms  FOLLOW UP PLANS AND APPOINTMENTS Allergies  Allergen Reactions  . Ace Inhibitors Swelling    Pt tolerates lisinopril  . Lipitor [Atorvastatin Calcium] Swelling  . Metformin And Related Swelling  . Cefadroxil Hives  . Cephalexin Hives  . Morphine And Related Other (See Comments)    Sweating, feels like is "in rocky boat."  . Robaxin [Methocarbamol] Other (See Comments)    Feels like he is shaky     Medication List    STOP taking these medications        lisinopril 5 MG tablet  Commonly known as:  PRINIVIL,ZESTRIL      TAKE these medications        acetaminophen 325 MG tablet  Commonly known as:  TYLENOL  Take 2 tablets (650 mg total) by mouth every 4 (four) hours as needed for headache or mild pain.     aspirin EC 81 MG tablet  Take 81 mg by mouth  every morning.     calcium carbonate 500 MG chewable tablet  Commonly known as:  TUMS - dosed in mg elemental calcium  Chew 1 tablet (200 mg of elemental calcium total) by mouth 3 (three) times daily as needed for indigestion or heartburn.     cloNIDine 0.1 MG tablet  Commonly known as:  CATAPRES  Take 0.1 mg by mouth once.     diphenoxylate-atropine 2.5-0.025 MG per tablet  Commonly known as:  LOMOTIL  Take 1 tablet by mouth 3 (three) times daily as needed. For diarrhea     ezetimibe 10 MG tablet  Commonly known as:  ZETIA  Take 1 tablet (10 mg total) by mouth daily.     fexofenadine 180 MG tablet  Commonly known as:  ALLEGRA  Take 180 mg by mouth daily as needed for allergies or rhinitis.     furosemide 40 MG tablet  Commonly known as:  LASIX  Take 1 tablet (40 mg total) by mouth daily.     HYDROcodone-acetaminophen 5-325 MG per tablet  Commonly known as:  NORCO/VICODIN  Take 1 tablet by mouth every 6 (six) hours as needed for severe pain.     insulin NPH Human 100 UNIT/ML injection  Commonly known as:  HUMULIN N  Inject 0.2 mLs (20 Units total) into the skin 2 (two) times daily at 8 am and 10 pm.     isosorbide mononitrate 30 MG 24 hr tablet  Commonly known as:  IMDUR  Take 1 tablet (30 mg total) by mouth daily.     losartan 25 MG tablet  Commonly known as:  COZAAR  Take 1 tablet (25 mg total) by mouth daily.     metoprolol 50 MG tablet  Commonly known as:  LOPRESSOR  Take 1 tablet (50 mg total) by mouth 2 (two) times daily.     nitrofurantoin (macrocrystal-monohydrate) 100 MG capsule  Commonly known as:  MACROBID  Take 1 capsule (100 mg total) by mouth 2 (two) times daily.     nitroGLYCERIN 0.4 MG SL tablet  Commonly known as:  NITROSTAT  Place 1 tablet (0.4 mg total) under the tongue every 5 (five) minutes as needed for chest pain.     NON FORMULARY  Inhale 1 application into the lungs at bedtime. CPAP     omeprazole 20 MG capsule  Commonly known as:   PRILOSEC  Take 20 mg by  mouth daily.     oxybutynin 5 MG tablet  Commonly known as:  DITROPAN  Take 5 mg by mouth daily as needed for bladder spasms.     Sennosides 25 MG Tabs  Take 25 mg by mouth daily as needed (for constipation).        Discharge Instructions    Ambulatory referral to Neurology    Complete by:  As directed   Syncope, likely will need ambulatory EEG.     Diet - low sodium heart healthy    Complete by:  As directed      Diet Carb Modified    Complete by:  As directed      Increase activity slowly    Complete by:  As directed           Follow-up Information    Follow up with Candee Furbish, MD.   Specialty:  Cardiology   Why:  The office will call.   Contact information:   [id number] [place] [address] [phone]       BRING ALL MEDICATIONS WITH YOU TO FOLLOW UP APPOINTMENTS  Time spent with patient to include physician time: 49 min Signed: Rosaria Ferries, PA-C 05/06/2015, 2:29 PM Co-Sign MD

## 2015-05-06 NOTE — Patient Outreach (Signed)
Request from [name], RN to assign [place], [place] assigned  Thank you [place] Management Assistant [place] Assistant Phone: [phone]

## 2015-05-06 NOTE — Clinical Social Work Note (Signed)
[name] met pt at bedside. [name] introduce self and purpose of visit. [name] informed the pt that he will be discharge back to [facility] today. [name] and pt discussed ambulance transport. [name] contact [facility] at [phone] to schedule transport for the pt. [name] informed Tammy at [facility] regarding the pt return. [name] upload the pt's discharge summary. Bedside RN provided number to call report.   [place], MSW, [place]

## 2015-05-06 NOTE — Procedures (Signed)
Pt refuses the use of CPAP for this evening.

## 2015-05-06 NOTE — Discharge Instructions (Signed)
BMET in 1 week, results to Dr Marlou Porch.

## 2015-05-06 NOTE — Care Management Important Message (Signed)
Important Message  Patient Details  Name: AEDRIC MARINOFF MRN: [id number] Date of Birth: 08-03-1946   Medicare Important Message Given:  Yes-third notification given    Delrae Sawyers, RN 05/06/2015, 11:04 AM

## 2015-05-06 NOTE — Progress Notes (Signed)
Patient Name: [name] Date of Encounter: 05/06/2015  Principal Problem:   Chest pain at rest Active Problems:   DM (diabetes mellitus) with complications   Diabetic neuropathy   Hypertension   CAD- known RCA disease- medical Rx   Gait abnormality   Physical deconditioning   Unresponsive episode- suspected abscence seizures   Hyperlipidemia LDL goal <70   Abnormal nuclear stress test   Primary Cardiologist: Dr Marlou Porch  Patient Profile: 69 yo male w/ hx d-RCA dz 90%, rx medically, LE DVT, CVA, DM, neuropathy, neurogenic bladder, OSA (no CPAP), nl EF w/ diast dysfunction, admitted w/ chest pain 08/20. Cath 08/22 w/ RCA dissection, med rx, EF 32%, cath 08/24 w/ L system non-obs dz, R dissection healed, EF 35-40%, prob mild AS. Reports brief syncope, no arrhythmia on tele, Neuro saw, no further eval. PT saw, SNF recommended.   SUBJECTIVE:   No chest pain, no SOB. Refuses SNF placement but will go back to rehab.   OBJECTIVE Filed Vitals:   05/05/15 1203 05/05/15 1700 05/05/15 2058 05/06/15 0415  BP: 134/61  110/47 130/49  Pulse: 62     Temp: 97.1 F (36.2 C)  97.9 F (36.6 C) 97.8 F (36.6 C)  TempSrc: Oral  Oral Oral  Resp: 12  17 19   Height:      Weight:    344 lb 11.2 oz (156.355 kg)  SpO2: 99% 99% 96% 97%    Intake/Output Summary (Last 24 hours) at 05/06/15 0904 Last data filed at 05/06/15 0423  Gross per 24 hour  Intake    360 ml  Output   2445 ml  Net  -2085 ml   Filed Weights   05/02/15 1600 05/04/15 0339 05/06/15 0415  Weight: 344 lb 2.2 oz (156.1 kg) 342 lb 13 oz (155.5 kg) 344 lb 11.2 oz (156.355 kg)    PHYSICAL EXAM General: Well developed, well nourished, male in no acute distress. Head: Normocephalic, atraumatic.  Neck: Supple without bruits, JVD minimal elevation. Lungs:  Resp regular and unlabored, decreased BS bases, few rales. Heart: RRR, S1, S2, no S3, S4, soft murmur; no rub. Abdomen: Soft, non-tender, non-distended, BS + x 4.    Extremities: No clubbing, cyanosis, edema.  Neuro: Alert and oriented X 3. Moves all extremities spontaneously. Psych: Normal affect.  LABS: Cardiac Enzymes:  Recent Labs  05/03/15 0919 05/03/15 1456 05/03/15 2108  TROPONINI 0.09* 0.12* 0.11*   BNP:  B NATRIURETIC PEPTIDE  Date/Time Value Ref Range Status  12/22/2014 11:30 PM 120.1* 0.0 - 100.0 pg/mL Final  09/14/2014 06:44 AM 95.8 0.0 - 100.0 pg/mL Final    Comment:    Please note change in reference range.   TELE:  SR, PVCs and occ pairs, no longer runs. PACs (nonconducted and 1 dropped QRS)      Current Medications:  . aspirin EC  81 mg Oral q morning - 10a  . Chlorhexidine Gluconate Cloth  6 each Topical Q0600  . enoxaparin (LOVENOX) injection  80 mg Subcutaneous Daily  . ezetimibe  10 mg Oral Daily  . furosemide  40 mg Oral Daily  . insulin aspart  0-15 Units Subcutaneous TID WC  . insulin aspart  0-5 Units Subcutaneous QHS  . insulin NPH Human  20 Units Subcutaneous BID AC & HS  . isosorbide mononitrate  30 mg Oral Daily  . loratadine  10 mg Oral Daily  . losartan  25 mg Oral Daily  . metoprolol  50 mg Oral BID  .  mupirocin ointment  1 application Nasal BID  . ondansetron (ZOFRAN) IV  8 mg Intravenous 4 times per day  . pantoprazole  40 mg Oral Daily  . sodium chloride  3 mL Intravenous Q12H      ASSESSMENT AND PLAN: Principal Problem:   Chest pain at rest Active Problems:   DM (diabetes mellitus) with complications   Diabetic neuropathy   Hypertension   CAD- known RCA disease- medical Rx   Gait abnormality   Physical deconditioning   Unresponsive episode- suspected abscence seizures   Hyperlipidemia LDL goal <70   Abnormal nuclear stress test   Principal Problem:  Chest pain at rest - RCA dissection at initial cath being treated medically; improved at relook - LVD is new, LAD system non-obs dz, therefore NICM - on ASA, BB, Zetia (intolerant of statins), plan for referral to clinic for PC-SK9 if  does not tolerate Zetia - Imdur and losartan added 08/25 - SR, S brady so no further increase in BB - Morphine listed as allergy, tolerating it well at this time.   Non-Ischemic CM - on Lasix - on ARB     Sinus brady - HR mid-40s while asleep, ?2nd sleep apnea - no pauses > 2 sec, no HR sustained < 43. - for now, no BB dose change.   Syncope - Seen by neurology 12/2013 during hospitalization, no clear diagnosis listed.  - pt states has happened in the hospital this admit, he will suddenly stop responding and return in a few seconds. Not orthostatic in nature - as he has fallen multiple times PTA, neuro has seen. - EEG OK, can get ambulatory monitoring, or EMU as OP.    Deconditioning - PT has seen, SNF recommended - pt refuses this, reluctantly agrees to rehab. - was at [facility], will get S.W. Consult to prep for return there.  Otherwise, continue current therapy. Pt has bed at [facility], d/c there when medically ready.  Signed, Rosaria Ferries , PA-C 9:04 AM 05/06/2015   The patient was seen, examined and discussed with Rosaria Ferries, PA-C and I agree with the above.   69 year old male with known CAD, presented with typical chest pain and has abnormal stress test with reversible ischemia involving the mid and distal lateral wall and the proximal inferior wall. Hypokinesis involving the entire left ventricle with the exception of the anterior wall. Akinesis involving the upper septum at the base of the heart and decreased LVEF 32%.  We started Zetia as he didn't tolerate 3-4 statins in the past, if he doesn't tolerate zetia he will be referred to our lipid clinic for PC-SK 9 inhibitors.  He underwent left cardiac cath on 9/22 by Dr Gwenlyn Found, he was found to have moderate distal RCA disease with an occluded PLA system compared to his cath in 2013, there was a catheter dissection of the origin of the RCA Relook yesterday showed improvement. Started on imdur 30 mg  po daily and losartan 25 mg po daily, denies CO or SOB today.  Neurology saw him for possible seizure activity, EEG normal, no seizure activity, its possible that his symptoms are orthostatic . PT started yesterday. Transfer to rehab today.   Dorothy Spark, MD 05/06/2015

## 2015-05-07 ENCOUNTER — Other Ambulatory Visit: Payer: Self-pay | Admitting: Cardiology

## 2015-05-09 ENCOUNTER — Non-Acute Institutional Stay (SKILLED_NURSING_FACILITY): Payer: Medicare Other | Admitting: Adult Health

## 2015-05-09 ENCOUNTER — Encounter: Payer: Self-pay | Admitting: Adult Health

## 2015-05-09 DIAGNOSIS — I5032 Chronic diastolic (congestive) heart failure: Secondary | ICD-10-CM | POA: Diagnosis not present

## 2015-05-09 DIAGNOSIS — G4733 Obstructive sleep apnea (adult) (pediatric): Secondary | ICD-10-CM

## 2015-05-09 DIAGNOSIS — IMO0002 Reserved for concepts with insufficient information to code with codable children: Secondary | ICD-10-CM

## 2015-05-09 DIAGNOSIS — N390 Urinary tract infection, site not specified: Secondary | ICD-10-CM | POA: Diagnosis not present

## 2015-05-09 DIAGNOSIS — E1143 Type 2 diabetes mellitus with diabetic autonomic (poly)neuropathy: Secondary | ICD-10-CM | POA: Insufficient documentation

## 2015-05-09 DIAGNOSIS — I200 Unstable angina: Secondary | ICD-10-CM

## 2015-05-09 DIAGNOSIS — E1165 Type 2 diabetes mellitus with hyperglycemia: Secondary | ICD-10-CM

## 2015-05-09 DIAGNOSIS — K219 Gastro-esophageal reflux disease without esophagitis: Secondary | ICD-10-CM | POA: Diagnosis not present

## 2015-05-09 DIAGNOSIS — I10 Essential (primary) hypertension: Secondary | ICD-10-CM | POA: Diagnosis not present

## 2015-05-09 DIAGNOSIS — E1159 Type 2 diabetes mellitus with other circulatory complications: Secondary | ICD-10-CM | POA: Diagnosis not present

## 2015-05-09 DIAGNOSIS — E1151 Type 2 diabetes mellitus with diabetic peripheral angiopathy without gangrene: Secondary | ICD-10-CM

## 2015-05-09 DIAGNOSIS — I2510 Atherosclerotic heart disease of native coronary artery without angina pectoris: Secondary | ICD-10-CM

## 2015-05-09 DIAGNOSIS — I2583 Coronary atherosclerosis due to lipid rich plaque: Principal | ICD-10-CM

## 2015-05-09 HISTORY — DX: Type 2 diabetes mellitus with diabetic autonomic (poly)neuropathy: E11.43

## 2015-05-09 HISTORY — DX: Reserved for concepts with insufficient information to code with codable children: IMO0002

## 2015-05-09 NOTE — Progress Notes (Signed)
Patient ID: [name], male   DOB: [birth year], 69 y.o.   MRN: [id number]   Facility: [facility]      Allergies  Allergen Reactions  . Ace Inhibitors Swelling    Pt tolerates lisinopril  . Lipitor [Atorvastatin Calcium] Swelling  . Metformin And Related Swelling  . Cefadroxil Hives  . Cephalexin Hives  . Morphine And Related Other (See Comments)    Sweating, feels like is "in rocky boat."  . Robaxin [Methocarbamol] Other (See Comments)    Feels like he is shaky    Chief Complaint  Patient presents with  . Hospitalization Follow-up    HPI:  He has been hospitalized for unstable angina. He had a stress test which demonstrated reversible ischemia and new left ventricular dysfunction. On 05-04-15 he had a cardiac cath with ef of 35-40% and nonischemic cardiomyopathy. He states that he is feeling better; with no chest pain. He is here for short term rehab with his goal to return back home.    Past Medical History  Diagnosis Date  . Obese   . Hypertension   . Diabetic neuropathy, painful   . Chronic indwelling Foley catheter   . Bladder spasms   . Hyperlipidemia   . GERD (gastroesophageal reflux disease)   . Neurogenic bladder   . CAD (coronary artery disease)   . PONV (postoperative nausea and vomiting)   . CHF (congestive heart failure)   . DVT (deep venous thrombosis) 1980's    LLE  . Type II diabetes mellitus   . Diabetic diarrhea   . Pneumonia 1999  . OSA (obstructive sleep apnea)     "they wanted me to wear a mask; I couldn't" (11/10/2014)  . Arthritis     "hands, ankles, knees" (11/10/2014)  . Chronic lower back pain   . Stroke 10/2011; 06/2014    right hand numbness/notes 10/20/2011, pt not sure he had stroke in 2013; "right hand weaker and right face not quite right since" (11/10/2014)    Past Surgical History  Procedure Laterality Date  . Gastroplasty    . Total knee arthroplasty Right 1990's  . Left heart catheterization with coronary  angiogram N/A 10/24/2011    Procedure: LEFT HEART CATHETERIZATION WITH CORONARY ANGIOGRAM;  Surgeon: Candee Furbish, MD;  Location: [facility] CATH LAB;  Service: Cardiovascular;  Laterality: N/A;  right radial artery approach  . Cholecystectomy open  1970's?  . Joint replacement    . Carpal tunnel release Bilateral ~ 2003-2004  . Cardiac catheterization N/A 05/02/2015    Procedure: Left Heart Cath and Coronary Angiography;  Surgeon: Lorretta Harp, MD;  Location: [place] CV LAB;  Service: Cardiovascular;  Laterality: N/A;  . Cardiac catheterization N/A 05/04/2015    Procedure: Left Heart Cath and Coronary Angiography;  Surgeon: Wellington Hampshire, MD;  Location: [place] CV LAB;  Service: Cardiovascular;  Laterality: N/A;    VITAL SIGNS BP 138/60 mmHg  Pulse 67  Ht 5\' 11"  (1.803 m)  Wt 329 lb (149.233 kg)  BMI 45.91 kg/m2  Patient's Medications  New Prescriptions   No medications on file  Previous Medications   ACETAMINOPHEN (TYLENOL) 325 MG TABLET    Take 2 tablets (650 mg total) by mouth every 4 (four) hours as needed for headache or mild pain.   ASPIRIN EC 81 MG TABLET    Take 81 mg by mouth every morning.    CALCIUM CARBONATE (TUMS - DOSED IN MG ELEMENTAL CALCIUM) 500 MG CHEWABLE TABLET  Chew 1 tablet (200 mg of elemental calcium total) by mouth 3 (three) times daily as needed for indigestion or heartburn.   DIPHENOXYLATE-ATROPINE (LOMOTIL) 2.5-0.025 MG PER TABLET    Take 1 tablet by mouth 3 (three) times daily as needed. For diarrhea   EZETIMIBE (ZETIA) 10 MG TABLET    Take 1 tablet (10 mg total) by mouth daily.   FEXOFENADINE (ALLEGRA) 180 MG TABLET    Take 180 mg by mouth daily as needed for allergies or rhinitis.   FUROSEMIDE (LASIX) 40 MG TABLET    Take 1 tablet (40 mg total) by mouth daily.   HYDROCODONE-ACETAMINOPHEN (NORCO/VICODIN) 5-325 MG PER TABLET    Take 1 tablet by mouth every 6 (six) hours as needed for severe pain.   INSULIN NPH HUMAN (HUMULIN N) 100 UNIT/ML INJECTION     Inject 0.2 mLs (20 Units total) into the skin 2 (two) times daily at 8 am and 10 pm.   ISOSORBIDE MONONITRATE (IMDUR) 30 MG 24 HR TABLET    Take 1 tablet (30 mg total) by mouth daily.   LOSARTAN (COZAAR) 25 MG TABLET    Take 1 tablet (25 mg total) by mouth daily.   METOPROLOL (LOPRESSOR) 50 MG TABLET    Take 1 tablet (50 mg total) by mouth 2 (two) times daily.   NITROFURANTOIN, MACROCRYSTAL-MONOHYDRATE, (MACROBID) 100 MG CAPSULE    Take 1 capsule (100 mg total) by mouth 2 (two) times daily.   NITROGLYCERIN (NITROSTAT) 0.4 MG SL TABLET    Place 1 tablet (0.4 mg total) under the tongue every 5 (five) minutes as needed for chest pain.   NON FORMULARY    Inhale 1 application into the lungs at bedtime. CPAP   OMEPRAZOLE (PRILOSEC) 20 MG CAPSULE    Take 20 mg by mouth daily.   OXYBUTYNIN (DITROPAN) 5 MG TABLET    Take 5 mg by mouth daily as needed for bladder spasms.   SENNOSIDES 25 MG TABS    Take 25 mg by mouth daily as needed (for constipation).  Modified Medications   No medications on file  Discontinued Medications   No medications on file     SIGNIFICANT DIAGNOSTIC EXAMS  04-14-15: ct of head: No evidence of acute intracranial abnormality.   04-15-15: left shoulder x-ray: No acute bony abnormality.  04-29-15: chest x-ray: No active cardiopulmonary disease.   05-01-15: lexican myoview: 1. Reversible ischemia involving the mid and distal lateral wall and the proximal inferior wall.  2. Hypokinesis involving the entire left ventricle with the exception of the anterior wall. Akinesis involving the upper septum at the base of the heart. 3. Left ventricular ejection fraction 32% 4. High-risk stress test findings*.  05-04-15: cardiac cath:  Coronary angiography:  Coronary dominance:  Right    Left Main:  Normal  Left Anterior Descending (LAD):  Normal in size with mild calcifications. There is diffuse 20-30% disease proximally.  1st diagonal (D1):  Normal in size with 40% mid  stenosis.  2nd diagonal (D2):  Normal in size with 40% ostial stenosis. This gives 3 medium-sized branches.    Circumflex (LCx):  Normal in size with minor irregularities.  1st obtuse marginal:  Normal  2nd obtuse marginal:  Absent  3rd obtuse marginal:   Small in size with minor irregularities.         Right Coronary Artery: Normal in size and mildly calcified. There is no evidence of dissection. There is 30-40% stenosis in the midsegment. There is 60% stenosis distally.  Posterior descending artery: Normal in size with minor irregularities.    Posterior AV segment: Occluded. Left ventriculography: Left ventricular systolic function is moderately reduced , LVEF is estimated at 35-40  %, there is no  significant mitral regurgitation   Final Conclusions:    1. 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 There is moderate stenosis distally with occluded AV segment as documented before. Otherwise, there is mild disease affecting the LAD. 2. Moderately reduced LV systolic function with an ejection fraction of 35-40% due to nonischemic cardiomyopathy. 3. Mild gradient across the aortic valve suggestive of mild stenosis. Mildly elevated left ventricular end-diastolic pressure. Recommendations: No revascularization is recommended. Continue medical therapy for nonobstructive coronary artery disease and cardiomyopathy.    LABS REVIEWED:   04-14-15: hgb a1c 9.6 04-30-15: wbc 7.2; hgb 14.3; hct 42.6; mcv 87.8 ;plt 189; chol 163; ldl 106; trig 133; hdl 30; hgb a1c 9.3 05-03-15: wbc 7.3; hgb 13.8; hct 40.3; mcv 87.0; plt 177; glucose 165; bun 9; creat 0.71; k+3.9; [id number]     Review of Systems  Constitutional: Negative for appetite change and fatigue.  HENT: Negative for congestion.   Respiratory: Negative for cough, chest tightness and shortness of breath.   Cardiovascular: Negative for chest pain, palpitations and leg swelling.   Gastrointestinal: Negative for nausea, abdominal pain, diarrhea and constipation.  Musculoskeletal: Negative for myalgias and arthralgias.  Skin: Negative for pallor.  Neurological: Negative for dizziness.  Psychiatric/Behavioral: The patient is not nervous/anxious.       Physical Exam  Constitutional: No distress.  Obese   Eyes: Conjunctivae are normal.  Neck: Neck supple. No JVD present. No thyromegaly present.  Cardiovascular: Normal rate, regular rhythm and intact distal pulses.   Respiratory: Effort normal and breath sounds normal. No respiratory distress. He has no wheezes.  GI: Soft. Bowel sounds are normal. He exhibits no distension. There is no tenderness.  Musculoskeletal: He exhibits no edema.  Able to move all extremities   Lymphadenopathy:    He has no cervical adenopathy.  Neurological: He is alert.  Skin: Skin is warm and dry. He is not diaphoretic.  Lower extremities discolored.   Psychiatric: He has a normal mood and affect.     ASSESSMENT/ PLAN:  1. CAD with RCA disease: he is being treated medically; is presently stable will continue imdur 30 mg daily asa 81 mg daily and prn ntg will monitor  2. Chronic diastolic heart failure: is presently stable will continue daily weights; will continue lasix 40 mg daily ; lopressor 50 mg twice daily; imdur 30 mg daily; cozaar 25 mg daily will monitor  3. Hypertension: is presently stable will continue lopressor 50 mg twice daily cozaar 25 mg daily   4. OSA: is without change; is to use cpap nightly; but will decline; will not make changes will monitor  5. UTI: has completed coarse of macrobid will monitor  6. Diabetes: his hgb a1c is 9.3; will continue NPH 20 units twice daily and will monitor  7. Jerrye Bushy: will continue prilosec 20 mg daily   8. Dyslipidemia: will continue zetia 10 mg daily his ldl is 106 will monitor    Time spent with patient  50  minutes >50% time spent counseling; reviewing medical record;  tests; labs; and developing future plan of care    Ok Edwards NP [facility]  Contact [phone] Monday through Friday 8am- 5pm  After hours call [phone]

## 2015-05-10 ENCOUNTER — Non-Acute Institutional Stay (SKILLED_NURSING_FACILITY): Payer: Medicare Other | Admitting: Internal Medicine

## 2015-05-10 ENCOUNTER — Encounter: Payer: Self-pay | Admitting: Internal Medicine

## 2015-05-10 DIAGNOSIS — I200 Unstable angina: Secondary | ICD-10-CM | POA: Diagnosis not present

## 2015-05-10 DIAGNOSIS — K219 Gastro-esophageal reflux disease without esophagitis: Secondary | ICD-10-CM

## 2015-05-10 DIAGNOSIS — R5381 Other malaise: Secondary | ICD-10-CM | POA: Diagnosis not present

## 2015-05-10 DIAGNOSIS — G4733 Obstructive sleep apnea (adult) (pediatric): Secondary | ICD-10-CM

## 2015-05-10 DIAGNOSIS — E1151 Type 2 diabetes mellitus with diabetic peripheral angiopathy without gangrene: Secondary | ICD-10-CM

## 2015-05-10 DIAGNOSIS — E1159 Type 2 diabetes mellitus with other circulatory complications: Secondary | ICD-10-CM | POA: Diagnosis not present

## 2015-05-10 DIAGNOSIS — I25119 Atherosclerotic heart disease of native coronary artery with unspecified angina pectoris: Secondary | ICD-10-CM | POA: Diagnosis not present

## 2015-05-10 DIAGNOSIS — I5032 Chronic diastolic (congestive) heart failure: Secondary | ICD-10-CM | POA: Diagnosis not present

## 2015-05-10 DIAGNOSIS — I10 Essential (primary) hypertension: Secondary | ICD-10-CM | POA: Diagnosis not present

## 2015-05-10 DIAGNOSIS — N319 Neuromuscular dysfunction of bladder, unspecified: Secondary | ICD-10-CM

## 2015-05-10 DIAGNOSIS — E1165 Type 2 diabetes mellitus with hyperglycemia: Secondary | ICD-10-CM

## 2015-05-10 DIAGNOSIS — N390 Urinary tract infection, site not specified: Secondary | ICD-10-CM

## 2015-05-10 DIAGNOSIS — IMO0002 Reserved for concepts with insufficient information to code with codable children: Secondary | ICD-10-CM

## 2015-05-10 NOTE — Progress Notes (Signed)
Patient ID: [name], male   DOB: Jul 07, 1946, 69 y.o.   MRN: [id number]    HISTORY AND PHYSICAL   DATE: 05/10/15  Location:  [facility] Service: SNF [phone])   Extended Emergency Contact Information Primary Emergency Contact: Whiting,[name] Address: [address]          [place], [place] [address] [name] of [place] Phone: [phone] Relation: Friend Secondary Emergency Contact: [place] of [place] Phone: [phone] Relation: None  Advanced Directive information   DNR  Chief Complaint  Patient presents with  . Readmit To SNF    HPI:  69 yo male seen today for readmission into SNF following hospital stay for [place] with known hx CAD at RCA, suspected absence sz, abnormal nuclear stress test, DM with neuropathy, abnormal gait, hyperlipidemia, hx DVT, hx CVA and neurogenic bladder s/p chronic foley. He had mild elevation of cardiac enzymes. Lexiscan stress test showed reversible ischemia and he was set up for cath. He had cardiac cath via right inguinal on 8/24th that showed RCA disease and improved dissection of RCA. EF 35-40%. Medical tx recommended. He had an EEG due to c/a absence sz which showed no significant changes. Orthostatics not significant values. Head CT revealed no acute process.  Today he notes occasional CP with movement. He started PT yesterday. Pain different from cardiac pain.no nursing issues. No falls  DM - CBGs 250 today. No low BS reactions. He takes insulin. A1c 9.3%. Neuropathy pain controlled on norco  CAD/[place]/chronic diastolic HF- takes BB ARB, furosemide, imdur and prn SLNTG. He is on ASA daily  Neurogenic bladder - foley cath is chronic. He takes macrobid and oxybutynin.   GERD - stable on omeprazole  OSA - stable on CPAP. He takes allegra daily    He takes several vitamins/minerals. He will receive PT to strengthen due to generalized weakness. Plan is short term  rehab   Past Medical History  Diagnosis Date  . Obese   . Hypertension   . Diabetic neuropathy, painful   . Chronic indwelling Foley catheter   . Bladder spasms   . Hyperlipidemia   . GERD (gastroesophageal reflux disease)   . Neurogenic bladder   . CAD (coronary artery disease)   . PONV (postoperative nausea and vomiting)   . CHF (congestive heart failure)   . DVT (deep venous thrombosis) 1980's    LLE  . Type II diabetes mellitus   . Diabetic diarrhea   . Pneumonia 1999  . OSA (obstructive sleep apnea)     "they wanted me to wear a mask; I couldn't" (11/10/2014)  . Arthritis     "hands, ankles, knees" (11/10/2014)  . Chronic lower back pain   . Stroke 10/2011; 06/2014    right hand numbness/notes 10/20/2011, pt not sure he had stroke in 2013; "right hand weaker and right face not quite right since" (11/10/2014)    Past Surgical History  Procedure Laterality Date  . Gastroplasty    . Total knee arthroplasty Right 1990's  . Left heart catheterization with coronary angiogram N/A 10/24/2011    Procedure: LEFT HEART CATHETERIZATION WITH CORONARY ANGIOGRAM;  Surgeon: Candee Furbish, MD;  Location: [facility] CATH LAB;  Service: Cardiovascular;  Laterality: N/A;  right radial artery approach  . Cholecystectomy open  1970's?  . Joint replacement    . Carpal tunnel release Bilateral ~ 2003-2004  . Cardiac catheterization N/A 05/02/2015    Procedure: Left Heart Cath and  Coronary Angiography;  Surgeon: Lorretta Harp, MD;  Location: [facility] CV LAB;  Service: Cardiovascular;  Laterality: N/A;  . Cardiac catheterization N/A 05/04/2015    Procedure: Left Heart Cath and Coronary Angiography;  Surgeon: Wellington Hampshire, MD;  Location: [facility] CV LAB;  Service: Cardiovascular;  Laterality: N/A;    Patient Care Team: Lavone Orn, MD as PCP - General (Internal Medicine)  Social History   Social History  . Marital Status: Widowed    Spouse Name: N/A  . Number of Children: N/A  . Years of  Education: N/A   Occupational History  . Retired    Social History Main Topics  . Smoking status: Former Smoker -- 2.00 packs/day for 20 years    Types: Cigarettes    Quit date: 09/10/1976  . Smokeless tobacco: Never Used  . Alcohol Use: No     Comment: FORMER ALCOHOLIC; "sober since 77/82/4235"  . Drug Use: No  . Sexual Activity: No   Other Topics Concern  . Not on file   Social History Narrative     reports that he quit smoking about 38 years ago. His smoking use included Cigarettes. He has a 40 pack-year smoking history. He has never used smokeless tobacco. He reports that he does not drink alcohol or use illicit drugs.  Family History  Problem Relation Age of Onset  . Hypertension Mother   . Diabetes type I Father   . Cancer Brother      Testicular Cancer  . Cancer Brother     Leukemia   Family Status  Relation Status Death Age  . Father Deceased     DM/DVT  . Mother Deceased     old age  . Brother Alive   . Brother Marketing executive History  Administered Date(s) Administered  . Influenza Split 10/12/2011, 10/13/2011  . Influenza,inj,Quad PF,36+ Mos 05/06/2015  . Influenza-Unspecified 06/10/2014    Allergies  Allergen Reactions  . Ace Inhibitors Swelling    Pt tolerates lisinopril  . Lipitor [Atorvastatin Calcium] Swelling  . Metformin And Related Swelling  . Cefadroxil Hives  . Cephalexin Hives  . Morphine And Related Other (See Comments)    Sweating, feels like is "in rocky boat."  . Robaxin [Methocarbamol] Other (See Comments)    Feels like he is shaky    Medications: Patient's Medications  New Prescriptions   No medications on file  Previous Medications   ACETAMINOPHEN (TYLENOL) 325 MG TABLET    Take 2 tablets (650 mg total) by mouth every 4 (four) hours as needed for headache or mild pain.   ASPIRIN EC 81 MG TABLET    Take 81 mg by mouth every morning.    CALCIUM CARBONATE (TUMS - DOSED IN MG ELEMENTAL CALCIUM) 500 MG CHEWABLE TABLET     Chew 1 tablet (200 mg of elemental calcium total) by mouth 3 (three) times daily as needed for indigestion or heartburn.   CLONIDINE (CATAPRES) 0.1 MG TABLET    Take 0.1 mg by mouth once.   DIPHENOXYLATE-ATROPINE (LOMOTIL) 2.5-0.025 MG PER TABLET    Take 1 tablet by mouth 3 (three) times daily as needed. For diarrhea   EZETIMIBE (ZETIA) 10 MG TABLET    Take 1 tablet (10 mg total) by mouth daily.   FEXOFENADINE (ALLEGRA) 180 MG TABLET    Take 180 mg by mouth daily as needed for allergies or rhinitis.   FUROSEMIDE (LASIX) 40 MG TABLET    Take 1 tablet (  40 mg total) by mouth daily.   HYDROCODONE-ACETAMINOPHEN (NORCO/VICODIN) 5-325 MG PER TABLET    Take 1 tablet by mouth every 6 (six) hours as needed for severe pain.   INSULIN NPH HUMAN (HUMULIN N) 100 UNIT/ML INJECTION    Inject 0.2 mLs (20 Units total) into the skin 2 (two) times daily at 8 am and 10 pm.   ISOSORBIDE MONONITRATE (IMDUR) 30 MG 24 HR TABLET    Take 1 tablet (30 mg total) by mouth daily.   LOSARTAN (COZAAR) 25 MG TABLET    Take 1 tablet (25 mg total) by mouth daily.   METOPROLOL (LOPRESSOR) 50 MG TABLET    Take 1 tablet (50 mg total) by mouth 2 (two) times daily.   NITROFURANTOIN, MACROCRYSTAL-MONOHYDRATE, (MACROBID) 100 MG CAPSULE    Take 1 capsule (100 mg total) by mouth 2 (two) times daily.   NITROGLYCERIN (NITROSTAT) 0.4 MG SL TABLET    Place 1 tablet (0.4 mg total) under the tongue every 5 (five) minutes as needed for chest pain.   NON FORMULARY    Inhale 1 application into the lungs at bedtime. CPAP   OMEPRAZOLE (PRILOSEC) 20 MG CAPSULE    Take 20 mg by mouth daily.   OXYBUTYNIN (DITROPAN) 5 MG TABLET    Take 5 mg by mouth daily as needed for bladder spasms.   SENNOSIDES 25 MG TABS    Take 25 mg by mouth daily as needed (for constipation).  Modified Medications   No medications on file  Discontinued Medications   No medications on file    Review of Systems  Constitutional: Negative for chills, activity change and fatigue.   HENT: Negative for sore throat and trouble swallowing.   Eyes: Negative for visual disturbance.  Respiratory: Negative for cough, chest tightness and shortness of breath.   Cardiovascular: Positive for chest pain (with movement). Negative for palpitations and leg swelling.  Gastrointestinal: Negative for nausea, vomiting, abdominal pain and blood in stool.  Genitourinary: Negative for urgency, frequency and difficulty urinating.  Musculoskeletal: Negative for arthralgias and gait problem.  Skin: Positive for rash.  Neurological: Negative for weakness and headaches.  Psychiatric/Behavioral: Negative for confusion and sleep disturbance. The patient is not nervous/anxious.     Filed Vitals:   05/10/15 1721  BP: 133/72  Pulse: 68  Temp: 96.3 F (35.7 C)  Weight: 329 lb (149.233 kg)  SpO2: 98%   Body mass index is 45.91 kg/(m^2).  Physical Exam  Constitutional: He is oriented to person, place, and time. He appears well-developed and well-nourished.  Lying in bed in NAD.  HENT:  Mouth/Throat: Oropharynx is clear and moist.  Eyes: Pupils are equal, round, and reactive to light. No scleral icterus.  Neck: Neck supple. Carotid bruit is not present. No thyromegaly present.  Cardiovascular: Normal rate, regular rhythm and intact distal pulses.  Exam reveals no gallop and no friction rub.   Murmur heard.  Systolic murmur is present with a grade of 1/6  +1 pitting LE edema with chronic venous stasis changes. No calf TTP. Right inguinal area inspected and no signs of hematoma or bleeding. NT  Pulmonary/Chest: Effort normal and breath sounds normal. He has no wheezes. He has no rales. He exhibits tenderness.  Abdominal: Soft. Bowel sounds are normal. He exhibits no distension, no abdominal bruit, no pulsatile midline mass and no mass. There is no tenderness. There is no rebound and no guarding.  Genitourinary:  Foley cath intact with clear yellow urine DTG  Musculoskeletal: He  exhibits edema  and tenderness.  Lymphadenopathy:    He has no cervical adenopathy.  Neurological: He is alert and oriented to person, place, and time.  Skin: Skin is warm and dry. Rash (LE chronic venous stasis changes) noted.  Psychiatric: He has a normal mood and affect. His behavior is normal. Thought content normal.     Labs reviewed: Admission on 04/29/2015, Discharged on 05/06/2015  Component Date Value Ref Range Status  . Glucose-Capillary 04/29/2015 320* 65 - 99 mg/dL Final  . Comment 1 04/29/2015 Notify RN   Final  . Comment 2 04/29/2015 Document in Chart   Final  . WBC 04/29/2015 7.2  4.0 - 10.5 K/uL Final  . RBC 04/29/2015 4.89  4.22 - 5.81 MIL/uL Final  . Hemoglobin 04/29/2015 14.6  13.0 - 17.0 g/dL Final  . HCT 04/29/2015 42.6  39.0 - 52.0 % Final  . MCV 04/29/2015 87.1  78.0 - 100.0 fL Final  . MCH 04/29/2015 29.9  26.0 - 34.0 pg Final  . MCHC 04/29/2015 34.3  30.0 - 36.0 g/dL Final  . RDW 04/29/2015 13.3  11.5 - 15.5 % Final  . Platelets 04/29/2015 197  150 - 400 K/uL Final  . Neutrophils Relative % 04/29/2015 59  43 - 77 % Final  . Neutro Abs 04/29/2015 4.3  1.7 - 7.7 K/uL Final  . Lymphocytes Relative 04/29/2015 23  12 - 46 % Final  . Lymphs Abs 04/29/2015 1.6  0.7 - 4.0 K/uL Final  . Monocytes Relative 04/29/2015 7  3 - 12 % Final  . Monocytes Absolute 04/29/2015 0.5  0.1 - 1.0 K/uL Final  . Eosinophils Relative 04/29/2015 9* 0 - 5 % Final  . Eosinophils Absolute 04/29/2015 0.6  0.0 - 0.7 K/uL Final  . Basophils Relative 04/29/2015 2* 0 - 1 % Final  . Basophils Absolute 04/29/2015 0.1  0.0 - 0.1 K/uL Final  . Sodium 04/29/2015 135  135 - 145 mmol/L Final  . Potassium 04/29/2015 4.6  3.5 - 5.1 mmol/L Final  . Chloride 04/29/2015 101  101 - 111 mmol/L Final  . CO2 04/29/2015 27  22 - 32 mmol/L Final  . Glucose, Bld 04/29/2015 352* 65 - 99 mg/dL Final  . BUN 04/29/2015 12  6 - 20 mg/dL Final  . Creatinine, Ser 04/29/2015 0.72  0.61 - 1.24 mg/dL Final  . Calcium 04/29/2015 9.1   8.9 - 10.3 mg/dL Final  . Total Protein 04/29/2015 6.6  6.5 - 8.1 g/dL Final  . Albumin 04/29/2015 3.0* 3.5 - 5.0 g/dL Final  . AST 04/29/2015 19  15 - 41 U/L Final  . ALT 04/29/2015 14* 17 - 63 U/L Final  . Alkaline Phosphatase 04/29/2015 75  38 - 126 U/L Final  . Total Bilirubin 04/29/2015 0.4  0.3 - 1.2 mg/dL Final  . GFR calc non Af Amer 04/29/2015 >60  >60 mL/min Final  . GFR calc Af Amer 04/29/2015 >60  >60 mL/min Final   Comment: (NOTE) The eGFR has been calculated using the CKD EPI equation. This calculation has not been validated in all clinical situations. eGFR's persistently <60 mL/min signify possible Chronic Kidney Disease.   . Anion gap 04/29/2015 7  5 - 15 Final  . Troponin i, poc 04/29/2015 0.00  0.00 - 0.08 ng/mL Final  . Comment 3 04/29/2015          Final   Comment: Due to the release kinetics of cTnI, a negative result within the first hours of the onset  of symptoms does not rule out myocardial infarction with certainty. If myocardial infarction is still suspected, repeat the test at appropriate intervals.   Marland Kitchen MRSA by PCR 04/30/2015 POSITIVE* NEGATIVE Final   Comment:        The GeneXpert MRSA Assay (FDA approved for NASAL specimens only), is one component of a comprehensive MRSA colonization surveillance program. It is not intended to diagnose MRSA infection nor to guide or monitor treatment for MRSA infections. RESULT CALLED TO, READ BACK BY AND VERIFIED WITH: Derrell Lolling RN [phone] [id number] GREEN R   . Troponin I 04/30/2015 <0.03  <0.031 ng/mL Final   Comment:        NO INDICATION OF MYOCARDIAL INJURY.   . Troponin I 04/30/2015 <0.03  <0.031 ng/mL Final   Comment:        NO INDICATION OF MYOCARDIAL INJURY.   . Troponin I 04/30/2015 <0.03  <0.031 ng/mL Final   Comment:        NO INDICATION OF MYOCARDIAL INJURY.   . WBC 04/30/2015 7.2  4.0 - 10.5 K/uL Final  . RBC 04/30/2015 4.85  4.22 - 5.81 MIL/uL Final  . Hemoglobin 04/30/2015 14.3  13.0 -  17.0 g/dL Final  . HCT 04/30/2015 42.6  39.0 - 52.0 % Final  . MCV 04/30/2015 87.8  78.0 - 100.0 fL Final  . MCH 04/30/2015 29.5  26.0 - 34.0 pg Final  . MCHC 04/30/2015 33.6  30.0 - 36.0 g/dL Final  . RDW 04/30/2015 13.5  11.5 - 15.5 % Final  . Platelets 04/30/2015 189  150 - 400 K/uL Final  . Creatinine, Ser 04/30/2015 0.75  0.61 - 1.24 mg/dL Final  . GFR calc non Af Amer 04/30/2015 >60  >60 mL/min Final  . GFR calc Af Amer 04/30/2015 >60  >60 mL/min Final   Comment: (NOTE) The eGFR has been calculated using the CKD EPI equation. This calculation has not been validated in all clinical situations. eGFR's persistently <60 mL/min signify possible Chronic Kidney Disease.   . Hgb A1c MFr Bld 04/30/2015 9.3* 4.8 - 5.6 % Final   Comment: (NOTE)         Pre-diabetes: 5.7 - 6.4         Diabetes: >6.4         Glycemic control for adults with diabetes: <7.0   . Mean Plasma Glucose 04/30/2015 220   Final   Comment: (NOTE) Performed At: [facility] [place], [place] [id number] Lindon Romp MD BJ:[id number]   . Glucose-Capillary 04/30/2015 251* 65 - 99 mg/dL Final  . Comment 1 04/30/2015 Notify RN   Final  . Cholesterol 04/30/2015 163  0 - 200 mg/dL Final  . Triglycerides 04/30/2015 133  <150 mg/dL Final  . HDL 04/30/2015 30* >40 mg/dL Final  . Total CHOL/HDL Ratio 04/30/2015 5.4   Final  . VLDL 04/30/2015 27  0 - 40 mg/dL Final  . LDL Cholesterol 04/30/2015 106* 0 - 99 mg/dL Final   Comment:        Total Cholesterol/HDL:CHD Risk Coronary Heart Disease Risk Table                     Men   Women  1/2 Average Risk   3.4   3.3  Average Risk       5.0   4.4  2 X Average Risk   9.6   7.1  3 X Average Risk  23.4   11.0  Use the calculated Patient Ratio above and the CHD Risk Table to determine the patient's CHD Risk.        ATP III CLASSIFICATION (LDL):  <100     mg/dL   Optimal  100-129  mg/dL   Near or Above                    Optimal  130-159   mg/dL   Borderline  160-189  mg/dL   High  >190     mg/dL   Very High   . Glucose-Capillary 04/30/2015 248* 65 - 99 mg/dL Final  . Glucose-Capillary 04/30/2015 206* 65 - 99 mg/dL Final  . Glucose-Capillary 04/30/2015 334* 65 - 99 mg/dL Final  . Glucose-Capillary 04/30/2015 293* 65 - 99 mg/dL Final  . Glucose-Capillary 05/01/2015 127* 65 - 99 mg/dL Final  . Glucose-Capillary 05/01/2015 146* 65 - 99 mg/dL Final  . Glucose-Capillary 05/01/2015 294* 65 - 99 mg/dL Final  . Glucose-Capillary 05/01/2015 203* 65 - 99 mg/dL Final  . Prothrombin Time 05/02/2015 14.6  11.6 - 15.2 seconds Final  . INR 05/02/2015 1.12  0.00 - 1.49 Final  . Glucose-Capillary 05/02/2015 189* 65 - 99 mg/dL Final  . Comment 1 05/02/2015 Notify RN   Final  . Comment 2 05/02/2015 Document in Chart   Final  . Glucose-Capillary 05/02/2015 137* 65 - 99 mg/dL Final  . Comment 1 05/02/2015 Notify RN   Final  . Comment 2 05/02/2015 Document in Chart   Final  . WBC 05/03/2015 7.3  4.0 - 10.5 K/uL Final  . RBC 05/03/2015 4.63  4.22 - 5.81 MIL/uL Final  . Hemoglobin 05/03/2015 13.8  13.0 - 17.0 g/dL Final  . HCT 05/03/2015 40.3  39.0 - 52.0 % Final  . MCV 05/03/2015 87.0  78.0 - 100.0 fL Final  . MCH 05/03/2015 29.8  26.0 - 34.0 pg Final  . MCHC 05/03/2015 34.2  30.0 - 36.0 g/dL Final  . RDW 05/03/2015 13.3  11.5 - 15.5 % Final  . Platelets 05/03/2015 177  150 - 400 K/uL Final  . Sodium 05/03/2015 134* 135 - 145 mmol/L Final  . Potassium 05/03/2015 3.9  3.5 - 5.1 mmol/L Final  . Chloride 05/03/2015 102  101 - 111 mmol/L Final  . CO2 05/03/2015 27  22 - 32 mmol/L Final  . Glucose, Bld 05/03/2015 165* 65 - 99 mg/dL Final  . BUN 05/03/2015 9  6 - 20 mg/dL Final  . Creatinine, Ser 05/03/2015 0.71  0.61 - 1.24 mg/dL Final  . Calcium 05/03/2015 8.3* 8.9 - 10.3 mg/dL Final  . GFR calc non Af Amer 05/03/2015 >60  >60 mL/min Final  . GFR calc Af Amer 05/03/2015 >60  >60 mL/min Final   Comment: (NOTE) The eGFR has been calculated  using the CKD EPI equation. This calculation has not been validated in all clinical situations. eGFR's persistently <60 mL/min signify possible Chronic Kidney Disease.   . Anion gap 05/03/2015 5  5 - 15 Final  . Glucose-Capillary 05/02/2015 143* 65 - 99 mg/dL Final  . Comment 1 05/02/2015 Notify RN   Final  . Activated Clotting Time 05/02/2015 478   Final  . Glucose-Capillary 05/02/2015 199* 65 - 99 mg/dL Final  . Comment 1 05/02/2015 Notify RN   Final  . Troponin I 05/03/2015 0.09* <0.031 ng/mL Final   Comment:        PERSISTENTLY INCREASED TROPONIN VALUES IN THE RANGE OF 0.04-0.49 ng/mL CAN BE SEEN IN:       -  UNSTABLE ANGINA       -CONGESTIVE HEART FAILURE       -MYOCARDITIS       -CHEST TRAUMA       -ARRYHTHMIAS       -LATE PRESENTING MYOCARDIAL INFARCTION       -COPD   CLINICAL FOLLOW-UP RECOMMENDED.   Marland Kitchen Troponin I 05/03/2015 0.12* <0.031 ng/mL Final   Comment:        PERSISTENTLY INCREASED TROPONIN VALUES IN THE RANGE OF 0.04-0.49 ng/mL CAN BE SEEN IN:       -UNSTABLE ANGINA       -CONGESTIVE HEART FAILURE       -MYOCARDITIS       -CHEST TRAUMA       -ARRYHTHMIAS       -LATE PRESENTING MYOCARDIAL INFARCTION       -COPD   CLINICAL FOLLOW-UP RECOMMENDED.   Marland Kitchen Troponin I 05/03/2015 0.11* <0.031 ng/mL Final   Comment:        PERSISTENTLY INCREASED TROPONIN VALUES IN THE RANGE OF 0.04-0.49 ng/mL CAN BE SEEN IN:       -UNSTABLE ANGINA       -CONGESTIVE HEART FAILURE       -MYOCARDITIS       -CHEST TRAUMA       -ARRYHTHMIAS       -LATE PRESENTING MYOCARDIAL INFARCTION       -COPD   CLINICAL FOLLOW-UP RECOMMENDED.   Marland Kitchen Glucose-Capillary 05/03/2015 125* 65 - 99 mg/dL Final  . Comment 1 05/03/2015 Capillary Specimen   Final  . Glucose-Capillary 05/03/2015 146* 65 - 99 mg/dL Final  . Glucose-Capillary 05/03/2015 106* 65 - 99 mg/dL Final  . Comment 1 05/03/2015 Capillary Specimen   Final  . Glucose-Capillary 05/03/2015 183* 65 - 99 mg/dL Final  . Comment 1 05/03/2015  Capillary Specimen   Final  . Glucose-Capillary 05/04/2015 136* 65 - 99 mg/dL Final  . Glucose-Capillary 05/04/2015 186* 65 - 99 mg/dL Final  . Comment 1 05/04/2015 Notify RN   Final  . Glucose-Capillary 05/04/2015 150* 65 - 99 mg/dL Final  . Comment 1 05/04/2015 Notify RN   Final  . Glucose-Capillary 05/05/2015 91  65 - 99 mg/dL Final  . Comment 1 05/05/2015 Capillary Specimen   Final  . Glucose-Capillary 05/05/2015 125* 65 - 99 mg/dL Final  . Glucose-Capillary 05/05/2015 151* 65 - 99 mg/dL Final  . Comment 1 05/05/2015 Notify RN   Final  . Comment 2 05/05/2015 Document in Chart   Final  . Glucose-Capillary 05/05/2015 184* 65 - 99 mg/dL Final  . Glucose-Capillary 05/06/2015 128* 65 - 99 mg/dL Final  . Glucose-Capillary 05/06/2015 170* 65 - 99 mg/dL Final  Admission on 04/14/2015, Discharged on 04/18/2015  Component Date Value Ref Range Status  . Sodium 04/14/2015 130* 135 - 145 mmol/L Final  . Potassium 04/14/2015 4.6  3.5 - 5.1 mmol/L Final  . Chloride 04/14/2015 96* 101 - 111 mmol/L Final  . CO2 04/14/2015 26  22 - 32 mmol/L Final  . Glucose, Bld 04/14/2015 358* 65 - 99 mg/dL Final  . BUN 04/14/2015 15  6 - 20 mg/dL Final  . Creatinine, Ser 04/14/2015 0.82  0.61 - 1.24 mg/dL Final  . Calcium 04/14/2015 8.8* 8.9 - 10.3 mg/dL Final  . Total Protein 04/14/2015 7.6  6.5 - 8.1 g/dL Final  . Albumin 04/14/2015 3.5  3.5 - 5.0 g/dL Final  . AST 04/14/2015 15  15 - 41 U/L Final  . ALT 04/14/2015 11* 17 -  63 U/L Final  . Alkaline Phosphatase 04/14/2015 102  38 - 126 U/L Final  . Total Bilirubin 04/14/2015 0.8  0.3 - 1.2 mg/dL Final  . GFR calc non Af Amer 04/14/2015 >60  >60 mL/min Final  . GFR calc Af Amer 04/14/2015 >60  >60 mL/min Final   Comment: (NOTE) The eGFR has been calculated using the CKD EPI equation. This calculation has not been validated in all clinical situations. eGFR's persistently <60 mL/min signify possible Chronic Kidney Disease.   . Anion gap 04/14/2015 8  5 - 15  Final  . WBC 04/14/2015 13.1* 4.0 - 10.5 K/uL Final  . RBC 04/14/2015 4.83  4.22 - 5.81 MIL/uL Final  . Hemoglobin 04/14/2015 14.5  13.0 - 17.0 g/dL Final  . HCT 04/14/2015 42.2  39.0 - 52.0 % Final  . MCV 04/14/2015 87.4  78.0 - 100.0 fL Final  . MCH 04/14/2015 30.0  26.0 - 34.0 pg Final  . MCHC 04/14/2015 34.4  30.0 - 36.0 g/dL Final  . RDW 04/14/2015 13.0  11.5 - 15.5 % Final  . Platelets 04/14/2015 285  150 - 400 K/uL Final  . Neutrophils Relative % 04/14/2015 76  43 - 77 % Final  . Neutro Abs 04/14/2015 9.9* 1.7 - 7.7 K/uL Final  . Lymphocytes Relative 04/14/2015 12  12 - 46 % Final  . Lymphs Abs 04/14/2015 1.6  0.7 - 4.0 K/uL Final  . Monocytes Relative 04/14/2015 5  3 - 12 % Final  . Monocytes Absolute 04/14/2015 0.7  0.1 - 1.0 K/uL Final  . Eosinophils Relative 04/14/2015 6* 0 - 5 % Final  . Eosinophils Absolute 04/14/2015 0.8* 0.0 - 0.7 K/uL Final  . Basophils Relative 04/14/2015 1  0 - 1 % Final  . Basophils Absolute 04/14/2015 0.1  0.0 - 0.1 K/uL Final  . Specimen Description 04/14/2015 BLOOD RIGHT ANTECUBITAL   Final  . Special Requests 04/14/2015 BOTTLES DRAWN AEROBIC AND ANAEROBIC 5ML   Final  . Culture 04/14/2015    Final                   Value:NO GROWTH 5 DAYS Performed at [facility]   . Report Status 04/14/2015 04/19/2015 FINAL   Final  . Specimen Description 04/14/2015 BLOOD LEFT ANTECUBITAL   Final  . Special Requests 04/14/2015 BOTTLES DRAWN AEROBIC AND ANAEROBIC 4.5ML   Final  . Culture 04/14/2015    Final                   Value:NO GROWTH 5 DAYS Performed at [facility]   . Report Status 04/14/2015 04/19/2015 FINAL   Final  . Color, Urine 04/14/2015 YELLOW  YELLOW Final  . APPearance 04/14/2015 CLOUDY* CLEAR Final  . Specific Gravity, Urine 04/14/2015 1.011  1.005 - 1.030 Final  . pH 04/14/2015 5.0  5.0 - 8.0 Final  . Glucose, UA 04/14/2015 500* NEGATIVE mg/dL Final  . Hgb urine dipstick 04/14/2015 LARGE* NEGATIVE Final  . Bilirubin  Urine 04/14/2015 NEGATIVE  NEGATIVE Final  . Ketones, ur 04/14/2015 NEGATIVE  NEGATIVE mg/dL Final  . Protein, ur 04/14/2015 100* NEGATIVE mg/dL Final  . Urobilinogen, UA 04/14/2015 0.2  0.0 - 1.0 mg/dL Final  . Nitrite 04/14/2015 NEGATIVE  NEGATIVE Final  . Leukocytes, UA 04/14/2015 MODERATE* NEGATIVE Final  . Specimen Description 04/14/2015 URINE, RANDOM   Final  . Special Requests 04/14/2015 NONE   Final  . Culture 04/14/2015    Final  Value:MULTIPLE SPECIES PRESENT, SUGGEST RECOLLECTION Performed at [facility]   . Report Status 04/14/2015 04/16/2015 FINAL   Final  . Lactic Acid, Venous 04/14/2015 1.5  0.5 - 2.0 mmol/L Final  . Lactic Acid, Venous 04/14/2015 1.2  0.5 - 2.0 mmol/L Final  . WBC, UA 04/14/2015 21-50  <3 WBC/hpf Final  . RBC / HPF 04/14/2015 21-50  <3 RBC/hpf Final  . Bacteria, UA 04/14/2015 MANY* RARE Final  . Hgb A1c MFr Bld 04/14/2015 9.6* 4.8 - 5.6 % Final   Comment: (NOTE)         Pre-diabetes: 5.7 - 6.4         Diabetes: >6.4         Glycemic control for adults with diabetes: <7.0   . Mean Plasma Glucose 04/14/2015 229   Final   Comment: (NOTE) Performed At: [facility] [place], [place] [id number] Lindon Romp MD [id number]   . Sodium 04/15/2015 135  135 - 145 mmol/L Final  . Potassium 04/15/2015 4.6  3.5 - 5.1 mmol/L Final  . Chloride 04/15/2015 101  101 - 111 mmol/L Final  . CO2 04/15/2015 28  22 - 32 mmol/L Final  . Glucose, Bld 04/15/2015 179* 65 - 99 mg/dL Final  . BUN 04/15/2015 13  6 - 20 mg/dL Final  . Creatinine, Ser 04/15/2015 0.70  0.61 - 1.24 mg/dL Final  . Calcium 04/15/2015 8.3* 8.9 - 10.3 mg/dL Final  . GFR calc non Af Amer 04/15/2015 >60  >60 mL/min Final  . GFR calc Af Amer 04/15/2015 >60  >60 mL/min Final   Comment: (NOTE) The eGFR has been calculated using the CKD EPI equation. This calculation has not been validated in all clinical situations. eGFR's persistently <60 mL/min  signify possible Chronic Kidney Disease.   . Anion gap 04/15/2015 6  5 - 15 Final  . WBC 04/15/2015 9.7  4.0 - 10.5 K/uL Final  . RBC 04/15/2015 4.41  4.22 - 5.81 MIL/uL Final  . Hemoglobin 04/15/2015 13.0  13.0 - 17.0 g/dL Final  . HCT 04/15/2015 39.6  39.0 - 52.0 % Final  . MCV 04/15/2015 89.8  78.0 - 100.0 fL Final  . MCH 04/15/2015 29.5  26.0 - 34.0 pg Final  . MCHC 04/15/2015 32.8  30.0 - 36.0 g/dL Final  . RDW 04/15/2015 13.2  11.5 - 15.5 % Final  . Platelets 04/15/2015 264  150 - 400 K/uL Final  . Glucose-Capillary 04/14/2015 301* 65 - 99 mg/dL Final  . Comment 1 04/14/2015 Notify RN   Final  . Comment 2 04/14/2015 Document in Chart   Final  . MRSA by PCR 04/14/2015 POSITIVE* NEGATIVE Final   Comment:        The GeneXpert MRSA Assay (FDA approved for NASAL specimens only), is one component of a comprehensive MRSA colonization surveillance program. It is not intended to diagnose MRSA infection nor to guide or monitor treatment for MRSA infections. RESULT CALLED TO, READ BACK BY AND VERIFIED WITH: E.MADRIAGA,RN AT 0045 04/15/15 BY L.PITT Performed at [facility]   . Glucose-Capillary 04/14/2015 208* 65 - 99 mg/dL Final  . Comment 1 04/14/2015 Notify RN   Final  . Comment 2 04/14/2015 Document in Chart   Final  . Glucose-Capillary 04/15/2015 129* 65 - 99 mg/dL Final  . Glucose-Capillary 04/15/2015 180* 65 - 99 mg/dL Final  . Glucose-Capillary 04/15/2015 146* 65 - 99 mg/dL Final  . Glucose-Capillary 04/15/2015 220* 65 - 99 mg/dL Final  .  Comment 1 04/15/2015 Notify RN   Final  . Comment 2 04/15/2015 Document in Chart   Final  . Comment 3 04/15/2015 Glucose Stabilizer   Final  . Magnesium 04/16/2015 1.7  1.7 - 2.4 mg/dL Final  . Glucose-Capillary 04/16/2015 147* 65 - 99 mg/dL Final  . Comment 1 04/16/2015 Notify RN   Final  . Comment 2 04/16/2015 Document in Chart   Final  . Glucose-Capillary 04/16/2015 133* 65 - 99 mg/dL Final  . Comment 1 04/16/2015 Notify RN    Final  . Comment 2 04/16/2015 Document in Chart   Final  . Vancomycin Tr 04/16/2015 13  10.0 - 20.0 ug/mL Final  . Glucose-Capillary 04/16/2015 143* 65 - 99 mg/dL Final  . Comment 1 04/16/2015 Notify RN   Final  . Comment 2 04/16/2015 Document in Chart   Final  . Glucose-Capillary 04/16/2015 187* 65 - 99 mg/dL Final  . Comment 1 04/16/2015 Notify RN   Final  . Magnesium 04/17/2015 1.8  1.7 - 2.4 mg/dL Final  . Troponin I 04/17/2015 <0.03  <0.031 ng/mL Final   Comment:        NO INDICATION OF MYOCARDIAL INJURY.   . Troponin I 04/17/2015 <0.03  <0.031 ng/mL Final   Comment:        NO INDICATION OF MYOCARDIAL INJURY.   . Glucose-Capillary 04/17/2015 151* 65 - 99 mg/dL Final  . Glucose-Capillary 04/17/2015 180* 65 - 99 mg/dL Final  . Glucose-Capillary 04/17/2015 142* 65 - 99 mg/dL Final  . Sodium 04/18/2015 135  135 - 145 mmol/L Final  . Potassium 04/18/2015 4.1  3.5 - 5.1 mmol/L Final  . Chloride 04/18/2015 101  101 - 111 mmol/L Final  . CO2 04/18/2015 29  22 - 32 mmol/L Final  . Glucose, Bld 04/18/2015 161* 65 - 99 mg/dL Final  . BUN 04/18/2015 14  6 - 20 mg/dL Final  . Creatinine, Ser 04/18/2015 0.68  0.61 - 1.24 mg/dL Final  . Calcium 04/18/2015 8.8* 8.9 - 10.3 mg/dL Final  . GFR calc non Af Amer 04/18/2015 >60  >60 mL/min Final  . GFR calc Af Amer 04/18/2015 >60  >60 mL/min Final   Comment: (NOTE) The eGFR has been calculated using the CKD EPI equation. This calculation has not been validated in all clinical situations. eGFR's persistently <60 mL/min signify possible Chronic Kidney Disease.   . Anion gap 04/18/2015 5  5 - 15 Final  . WBC 04/18/2015 7.3  4.0 - 10.5 K/uL Final  . RBC 04/18/2015 4.53  4.22 - 5.81 MIL/uL Final  . Hemoglobin 04/18/2015 13.0  13.0 - 17.0 g/dL Final  . HCT 04/18/2015 40.1  39.0 - 52.0 % Final  . MCV 04/18/2015 88.5  78.0 - 100.0 fL Final  . MCH 04/18/2015 28.7  26.0 - 34.0 pg Final  . MCHC 04/18/2015 32.4  30.0 - 36.0 g/dL Final  . RDW  04/18/2015 12.9  11.5 - 15.5 % Final  . Platelets 04/18/2015 247  150 - 400 K/uL Final  . Glucose-Capillary 04/17/2015 173* 65 - 99 mg/dL Final  . Troponin I 04/18/2015 <0.03  <0.031 ng/mL Final   Comment:        NO INDICATION OF MYOCARDIAL INJURY.   . Glucose-Capillary 04/18/2015 199* 65 - 99 mg/dL Final  . Glucose-Capillary 04/18/2015 161* 65 - 99 mg/dL Final    Dg Chest 2 View  04/29/2015   CLINICAL DATA:  Chest pain. Symptoms for 2 days. Initial encounter.  EXAM: CHEST  2  VIEW  COMPARISON:  12/22/2014.  FINDINGS: Cardiopericardial silhouette within normal limits. Mediastinal contours normal. Trachea midline. No airspace disease or effusion. Monitoring leads project over the chest.  IMPRESSION: No active cardiopulmonary disease.   Electronically Signed   By: Dereck Ligas M.D.   On: 04/29/2015 17:55   Ct Head Wo Contrast  04/14/2015   CLINICAL DATA:  Altered mental status.  Urinary tract infection.  EXAM: CT HEAD WITHOUT CONTRAST  TECHNIQUE: Contiguous axial images were obtained from the base of the skull through the vertex without intravenous contrast.  COMPARISON:  Head MRI and CT 11/15/2014  FINDINGS: Symmetric mineralization is again seen in the basal ganglia and thalami bilaterally. Ventricles and sulci are within normal limits for age. Chronic lacunar infarct is again seen in the left caudate. There is no evidence of acute cortical infarct, intracranial hemorrhage, mass, midline shift, or extra-axial fluid collection.  Orbits are unremarkable. The visualized paranasal sinuses and mastoid air cells are clear.  IMPRESSION: No evidence of acute intracranial abnormality.   Electronically Signed   By: Logan Bores   On: 04/14/2015 14:59   Nm Myocar Multi W/spect W/wall Motion / Ef  05/01/2015   CLINICAL DATA:  Six 22-year-old with current history of hypertension, hyperlipidemia, chronic diastolic heart failure, mild aortic stenosis and minimal mitral regurgitation. Prior cardiac  catheterization in February, 2013 demonstrating right coronary artery disease with 90% long segment PDA branch stenosis and proximal 50% stenosis of a large 1st diagonal. Patient presented 04/29/2015 with acute chest pain radiating into the left arm.  EXAM: MYOCARDIAL IMAGING WITH SPECT (REST AND PHARMACOLOGIC-STRESS - 2 DAY PROTOCOL)  GATED LEFT VENTRICULAR WALL MOTION STUDY  LEFT VENTRICULAR EJECTION FRACTION  TECHNIQUE: Standard myocardial SPECT imaging was performed after resting intravenous injection of 32.3 mCi [id number]. On a different day, intravenous infusion of Lexiscan was performed under the supervision of the Cardiology staff. At peak effect of the drug, 31.3 mCi [id number] was injected intravenously and standard myocardial SPECT imaging was performed. Quantitative gated imaging was also performed to evaluate left ventricular wall motion, and estimate left ventricular ejection fraction.  COMPARISON:  None.  FINDINGS: Subdiaphragmatic activity is present adjacent to the heart on the resting images.  Perfusion: Severe perfusion defects involving small to moderate-sized apical, anteroapical and apicoseptal segments. Severe perfusion defect involving a large segment of the entire lateral wall. Moderate perfusion defect involving a large segment of the entire inferior wall. Normal perfusion is identified only involving the anterior wall. Reversibility involving the mid and distal lateral wall and the proximal inferior wall.  Wall Motion: Hypokinesis involving the entire left ventricle with the exception of the anterior wall. Akinesis involving the upper septum at the base of the heart.  Left Ventricular Ejection Fraction: 3245%End diastolic volume 24038l  End systolic volume 16882l  IMPRESSION: 1. Reversible ischemia involving the mid and distal lateral wall and the proximal inferior wall.  2. Hypokinesis involving the entire left ventricle with the exception of the anterior wall. Akinesis  involving the upper septum at the base of the heart.  3. Left ventricular ejection fraction 32%  4. High-risk stress test findings*.  *2012 Appropriate Use Criteria for Coronary Revascularization Focused Update: J Am Coll Cardiol. 208003;49(1):[id number]://[email or web address]?articleid=[email or web address]   Electronically Signed   By: ThEvangeline Dakin.D.   On: 05/01/2015 12:26   Dg Shoulder Left  04/15/2015   CLINICAL DATA:  Acute left shoulder pain concentrated at lateral humeral head. No known injury.  EXAM: LEFT SHOULDER - 2+ VIEW  COMPARISON:  None.  FINDINGS: Mild degenerative changes in the left [facility] joint with joint space narrowing and spurring. No acute bony abnormality. Specifically, no fracture, subluxation, or dislocation. Soft tissues are intact.  IMPRESSION: No acute bony abnormality.   Electronically Signed   By: Rolm Baptise M.D.   On: 04/15/2015 11:51     Assessment/Plan   ICD-9-CM ICD-10-CM   1. Unstable angina with #2 411.1 I20.0   2. Coronary artery disease involving native coronary artery of native heart with angina pectoris -  414.01 I25.119    413.9    3. Physical deconditioning  799.3 R53.81   4. Chronic diastolic CHF (congestive heart failure) - with EF 35-40% 428.32 I50.32    428.0    5. Neurogenic bladder with chronic indwelling catheter 596.54 N31.9   6. Type II diabetes mellitus with peripheral circulatory disorder, uncontrolled  250.72 E11.59    443.81    7. Urinary tract infection without hematuria, site unspecified - improving 599.0 N39.0   8. Essential hypertension - stable 401.9 I10   9. Gastroesophageal reflux disease, esophagitis presence not specified - stable 530.81 K21.9   10. OSA (obstructive sleep apnea) on CPAP 327.23 G47.33     --cont CBG qAC and qHS. If CBG >150 give 3 units Novolog subcut  --cont other meds as ordered  --f/u with cardiology as scheduled  --CPAP qHS  --foley cath care as indicated  --daily weights before  breakfast  --PT/OT/ST as indicated  --GOAL: short term rehab and d/c home when medically appropriate. Communicated with pt and nursing.  --will follow  Lashonta Pilling S. Perlie Gold  [facility] [address] [place], [place] [address] [phone] Cell (Monday-Friday 8 AM - 5 PM) [phone] After 5 PM and follow prompts

## 2015-05-11 ENCOUNTER — Encounter (HOSPITAL_COMMUNITY): Payer: Self-pay | Admitting: *Deleted

## 2015-05-11 ENCOUNTER — Emergency Department (HOSPITAL_COMMUNITY)
Admission: EM | Admit: 2015-05-11 | Discharge: 2015-05-12 | Disposition: A | Discharge status: Home / Self Care | Payer: Medicare Other | Attending: Emergency Medicine | Admitting: Emergency Medicine

## 2015-05-11 DIAGNOSIS — Z79899 Other long term (current) drug therapy: Secondary | ICD-10-CM | POA: Insufficient documentation

## 2015-05-11 DIAGNOSIS — Z8701 Personal history of pneumonia (recurrent): Secondary | ICD-10-CM | POA: Insufficient documentation

## 2015-05-11 DIAGNOSIS — R1084 Generalized abdominal pain: Secondary | ICD-10-CM | POA: Diagnosis not present

## 2015-05-11 DIAGNOSIS — M1990 Unspecified osteoarthritis, unspecified site: Secondary | ICD-10-CM | POA: Insufficient documentation

## 2015-05-11 DIAGNOSIS — Z87891 Personal history of nicotine dependence: Secondary | ICD-10-CM | POA: Insufficient documentation

## 2015-05-11 DIAGNOSIS — I509 Heart failure, unspecified: Secondary | ICD-10-CM | POA: Insufficient documentation

## 2015-05-11 DIAGNOSIS — K6289 Other specified diseases of anus and rectum: Secondary | ICD-10-CM | POA: Diagnosis not present

## 2015-05-11 DIAGNOSIS — Z87448 Personal history of other diseases of urinary system: Secondary | ICD-10-CM | POA: Insufficient documentation

## 2015-05-11 DIAGNOSIS — K219 Gastro-esophageal reflux disease without esophagitis: Secondary | ICD-10-CM | POA: Diagnosis not present

## 2015-05-11 DIAGNOSIS — E1140 Type 2 diabetes mellitus with diabetic neuropathy, unspecified: Secondary | ICD-10-CM | POA: Diagnosis not present

## 2015-05-11 DIAGNOSIS — I10 Essential (primary) hypertension: Secondary | ICD-10-CM | POA: Diagnosis not present

## 2015-05-11 DIAGNOSIS — R1030 Lower abdominal pain, unspecified: Secondary | ICD-10-CM | POA: Diagnosis not present

## 2015-05-11 DIAGNOSIS — G8929 Other chronic pain: Secondary | ICD-10-CM | POA: Insufficient documentation

## 2015-05-11 DIAGNOSIS — I2510 Atherosclerotic heart disease of native coronary artery without angina pectoris: Secondary | ICD-10-CM | POA: Diagnosis not present

## 2015-05-11 DIAGNOSIS — Z7982 Long term (current) use of aspirin: Secondary | ICD-10-CM | POA: Insufficient documentation

## 2015-05-11 DIAGNOSIS — Z86718 Personal history of other venous thrombosis and embolism: Secondary | ICD-10-CM | POA: Insufficient documentation

## 2015-05-11 DIAGNOSIS — Z8673 Personal history of transient ischemic attack (TIA), and cerebral infarction without residual deficits: Secondary | ICD-10-CM | POA: Insufficient documentation

## 2015-05-11 DIAGNOSIS — Z794 Long term (current) use of insulin: Secondary | ICD-10-CM | POA: Insufficient documentation

## 2015-05-11 LAB — CBC WITH DIFFERENTIAL/PLATELET
BASOS ABS: 0.1 10*3/uL (ref 0.0–0.1)
BASOS PCT: 1 % (ref 0–1)
Eosinophils Absolute: 0.6 10*3/uL (ref 0.0–0.7)
Eosinophils Relative: 8 % — ABNORMAL HIGH (ref 0–5)
HEMATOCRIT: 38.8 % — AB (ref 39.0–52.0)
HEMOGLOBIN: 13.3 g/dL (ref 13.0–17.0)
LYMPHS PCT: 20 % (ref 12–46)
Lymphs Abs: 1.6 10*3/uL (ref 0.7–4.0)
MCH: 29.8 pg (ref 26.0–34.0)
MCHC: 34.3 g/dL (ref 30.0–36.0)
MCV: 87 fL (ref 78.0–100.0)
Monocytes Absolute: 0.6 10*3/uL (ref 0.1–1.0)
Monocytes Relative: 8 % (ref 3–12)
NEUTROS ABS: 5.1 10*3/uL (ref 1.7–7.7)
NEUTROS PCT: 63 % (ref 43–77)
Platelets: 186 10*3/uL (ref 150–400)
RBC: 4.46 MIL/uL (ref 4.22–5.81)
RDW: 13.6 % (ref 11.5–15.5)
WBC: 8 10*3/uL (ref 4.0–10.5)

## 2015-05-11 LAB — URINALYSIS, ROUTINE W REFLEX MICROSCOPIC
Bilirubin Urine: NEGATIVE
GLUCOSE, UA: NEGATIVE mg/dL
Ketones, ur: NEGATIVE mg/dL
LEUKOCYTES UA: NEGATIVE
Nitrite: NEGATIVE
PROTEIN: 100 mg/dL — AB
Specific Gravity, Urine: 1.017 (ref 1.005–1.030)
Urobilinogen, UA: 1 mg/dL (ref 0.0–1.0)
pH: 6 (ref 5.0–8.0)

## 2015-05-11 LAB — I-STAT CHEM 8, ED
BUN: 14 mg/dL (ref 6–20)
CHLORIDE: 98 mmol/L — AB (ref 101–111)
Calcium, Ion: 1.13 mmol/L (ref 1.13–1.30)
Creatinine, Ser: 0.7 mg/dL (ref 0.61–1.24)
GLUCOSE: 202 mg/dL — AB (ref 65–99)
HCT: 40 % (ref 39.0–52.0)
Hemoglobin: 13.6 g/dL (ref 13.0–17.0)
POTASSIUM: 3.9 mmol/L (ref 3.5–5.1)
Sodium: 135 mmol/L (ref 135–145)
TCO2: 25 mmol/L (ref 0–100)

## 2015-05-11 LAB — COMPREHENSIVE METABOLIC PANEL
ALBUMIN: 2.8 g/dL — AB (ref 3.5–5.0)
ALK PHOS: 73 U/L (ref 38–126)
ALT: 15 U/L — AB (ref 17–63)
AST: 19 U/L (ref 15–41)
Anion gap: 6 (ref 5–15)
BILIRUBIN TOTAL: 0.7 mg/dL (ref 0.3–1.2)
BUN: 12 mg/dL (ref 6–20)
CALCIUM: 8.7 mg/dL — AB (ref 8.9–10.3)
CO2: 26 mmol/L (ref 22–32)
Chloride: 103 mmol/L (ref 101–111)
Creatinine, Ser: 0.74 mg/dL (ref 0.61–1.24)
GFR calc Af Amer: >60 mL/min (ref >60)
GFR calc non Af Amer: >60 mL/min (ref >60)
GLUCOSE: 200 mg/dL — AB (ref 65–99)
POTASSIUM: 4 mmol/L (ref 3.5–5.1)
Sodium: 135 mmol/L (ref 135–145)
TOTAL PROTEIN: 6 g/dL — AB (ref 6.5–8.1)

## 2015-05-11 LAB — URINE MICROSCOPIC-ADD ON

## 2015-05-11 LAB — PROTIME-INR
INR: 1.06 (ref 0.00–1.49)
Prothrombin Time: 14 seconds (ref 11.6–15.2)

## 2015-05-11 LAB — LIPASE, BLOOD: Lipase: 19 U/L — ABNORMAL LOW (ref 22–51)

## 2015-05-11 MED ORDER — ONDANSETRON HCL 4 MG/2ML IJ SOLN
4.0000 mg | Freq: Once | INTRAMUSCULAR | Status: AC
  Administered 2015-05-11: 4 mg via INTRAVENOUS
  Filled 2015-05-11: qty 2

## 2015-05-11 MED ORDER — SENNOSIDES-DOCUSATE SODIUM 8.6-50 MG PO TABS: 2.0000 | ORAL_TABLET | Freq: Every day | ORAL | Status: DC

## 2015-05-11 MED ORDER — SODIUM CHLORIDE 0.9 % IV SOLN
1000.0000 mL | INTRAVENOUS | Status: DC
  Administered 2015-05-11: 1000 mL via INTRAVENOUS

## 2015-05-11 MED ORDER — IOHEXOL 300 MG/ML  SOLN
25.0000 mL | Freq: Once | INTRAMUSCULAR | Status: AC | PRN
  Administered 2015-05-11: 25 mL via ORAL

## 2015-05-11 NOTE — ED Notes (Signed)
Pt to ED via [place] - pt from [facility] - pt c/o lower abd pain x3 days and constipation x10 days - pt states he hasn't had a "complete" BM in 10 days - has been given multiple laxatives at nursing facility w/ no relief. Pt admits to some nausea, denies vomiting.

## 2015-05-11 NOTE — Discharge Instructions (Signed)
As discussed, your evaluation today has been largely reassuring.  But, it is important that you monitor your condition carefully, and do not hesitate to return to the ED if you develop new, or concerning changes in your condition. ° °Otherwise, please follow-up with your physician for appropriate ongoing care. ° ° °Abdominal Pain °Many things can cause abdominal pain. Usually, abdominal pain is not caused by a disease and will improve without treatment. It can often be observed and treated at home. Your health care provider will do a physical exam and possibly order blood tests and X-rays to help determine the seriousness of your pain. However, in many cases, more time must pass before a clear cause of the pain can be found. Before that point, your health care provider may not know if you need more testing or further treatment. °HOME CARE INSTRUCTIONS  °Monitor your abdominal pain for any changes. The following actions may help to alleviate any discomfort you are experiencing: °· Only take over-the-counter or prescription medicines as directed by your health care provider. °· Do not take laxatives unless directed to do so by your health care provider. °· Try a clear liquid diet (broth, tea, or water) as directed by your health care provider. Slowly move to a bland diet as tolerated. °SEEK MEDICAL CARE IF: °· You have unexplained abdominal pain. °· You have abdominal pain associated with nausea or diarrhea. °· You have pain when you urinate or have a bowel movement. °· You experience abdominal pain that wakes you in the night. °· You have abdominal pain that is worsened or improved by eating food. °· You have abdominal pain that is worsened with eating fatty foods. °· You have a fever. °SEEK IMMEDIATE MEDICAL CARE IF:  °· Your pain does not go away within 2 hours. °· You keep throwing up (vomiting). °· Your pain is felt only in portions of the abdomen, such as the right side or the left lower portion of the  abdomen. °· You pass bloody or black tarry stools. °MAKE SURE YOU: °· Understand these instructions.   °· Will watch your condition.   °· Will get help right away if you are not doing well or get worse.   °Document Released: 06/06/2005 Document Revised: 09/01/2013 Document Reviewed: 05/06/2013 °[employer]® Patient Information ©2015 [employer]. This information is not intended to replace advice given to you by your health care provider. Make sure you discuss any questions you have with your health care provider. ° °

## 2015-05-11 NOTE — ED Notes (Signed)
Pt w/ a large hard stool between buttocks, pt states he is unaware of bowel movements - Dr. Vanita Panda made aware of findings.

## 2015-05-11 NOTE — ED Provider Notes (Signed)
CSN: [id number]     Arrival date & time 05/11/15  2143 History   First MD Initiated Contact with Patient 05/11/15 2152     Chief Complaint  Patient presents with  . Constipation  . Abdominal Pain     (Consider location/radiation/quality/duration/timing/severity/associated sxs/prior Treatment) HPI Patient presents with concerns of abdominal pain, anorexia, no bowel movements or flatus. Patient is in the rehabilitation facility following recent admission here for chest pain, resulting in coronary intervention. Patient notes that over the past 10 days he has had persistent diffuse abdominal discomfort, decreased oral intake, and no stool output, no gas. Patient has been taking Lanoxin daily, with no change in his condition. No concurrent new chest pain, dyspnea, fever, chills. No vomiting, the patient is nauseous. He has history of multiple prior surgical procedures in his abdomen, though he is not sure of what.  Past Medical History  Diagnosis Date  . Obese   . Hypertension   . Diabetic neuropathy, painful   . Chronic indwelling Foley catheter   . Bladder spasms   . Hyperlipidemia   . GERD (gastroesophageal reflux disease)   . Neurogenic bladder   . CAD (coronary artery disease)   . PONV (postoperative nausea and vomiting)   . CHF (congestive heart failure)   . DVT (deep venous thrombosis) 1980's    LLE  . Type II diabetes mellitus   . Diabetic diarrhea   . Pneumonia 1999  . OSA (obstructive sleep apnea)     "they wanted me to wear a mask; I couldn't" (11/10/2014)  . Arthritis     "hands, ankles, knees" (11/10/2014)  . Chronic lower back pain   . Stroke 10/2011; 06/2014    right hand numbness/notes 10/20/2011, pt not sure he had stroke in 2013; "right hand weaker and right face not quite right since" (11/10/2014)   Past Surgical History  Procedure Laterality Date  . Gastroplasty    . Total knee arthroplasty Right 1990's  . Left heart catheterization with coronary angiogram N/A  10/24/2011    Procedure: LEFT HEART CATHETERIZATION WITH CORONARY ANGIOGRAM;  Surgeon: Candee Furbish, MD;  Location: [facility] CATH LAB;  Service: Cardiovascular;  Laterality: N/A;  right radial artery approach  . Cholecystectomy open  1970's?  . Joint replacement    . Carpal tunnel release Bilateral ~ 2003-2004  . Cardiac catheterization N/A 05/02/2015    Procedure: Left Heart Cath and Coronary Angiography;  Surgeon: Lorretta Harp, MD;  Location: [place] CV LAB;  Service: Cardiovascular;  Laterality: N/A;  . Cardiac catheterization N/A 05/04/2015    Procedure: Left Heart Cath and Coronary Angiography;  Surgeon: Wellington Hampshire, MD;  Location: [place] CV LAB;  Service: Cardiovascular;  Laterality: N/A;   Family History  Problem Relation Age of Onset  . Hypertension Mother   . Diabetes type I Father   . Cancer Brother      Testicular Cancer  . Cancer Brother     Leukemia   Social History  Substance Use Topics  . Smoking status: Former Smoker -- 2.00 packs/day for 20 years    Types: Cigarettes    Quit date: 09/10/1976  . Smokeless tobacco: Never Used  . 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 Negative for vomiting.       Baseline diminished sensation in his rectum  Endocrine:       Negative aside from HPI  Genitourinary:       Neg aside from HPI   Musculoskeletal:       Per HPI, otherwise negative  Skin: Negative.   Neurological: Negative for syncope.      Allergies  Ace inhibitors; Lipitor; Metformin and related; Cefadroxil; Cephalexin; Morphine and related; and Robaxin  Home Medications   Prior to Admission medications   Medication Sig Start Date End Date  Taking? Authorizing Provider  acetaminophen (TYLENOL) 325 MG tablet Take 2 tablets (650 mg total) by mouth every 4 (four) hours as needed for headache or mild pain. 12/08/14   Erlene Quan, PA-C  aspirin EC 81 MG tablet Take 81 mg by mouth every morning.     Historical Provider, MD  calcium carbonate (TUMS - DOSED IN MG ELEMENTAL CALCIUM) 500 MG chewable tablet Chew 1 tablet (200 mg of elemental calcium total) by mouth 3 (three) times daily as needed for indigestion or heartburn. 11/19/14   Debbe Odea, MD  cloNIDine (CATAPRES) 0.1 MG tablet Take 0.1 mg by mouth once.    Historical Provider, MD  diphenoxylate-atropine (LOMOTIL) 2.5-0.025 MG per tablet Take 1 tablet by mouth 3 (three) times daily as needed. For diarrhea 09/06/14   Tiffany L Reed, DO  ezetimibe (ZETIA) 10 MG tablet Take 1 tablet (10 mg total) by mouth daily. 05/06/15   Rhonda G Barrett, PA-C  fexofenadine (ALLEGRA) 180 MG tablet Take 180 mg by mouth daily as needed for allergies or rhinitis.    Historical Provider, MD  furosemide (LASIX) 40 MG tablet Take 1 tablet (40 mg total) by mouth daily. 04/03/14   Lavone Orn, MD  HYDROcodone-acetaminophen (NORCO/VICODIN) 5-325 MG per tablet Take 1 tablet by mouth every 6 (six) hours as needed for severe pain. 05/06/15   Rhonda G Barrett, PA-C  insulin NPH Human (HUMULIN N) 100 UNIT/ML injection Inject 0.2 mLs (20 Units total) into the skin 2 (two) times daily at 8 am and 10 pm. 04/18/15   Kelvin Cellar, MD  isosorbide mononitrate (IMDUR) 30 MG 24 hr tablet Take 1 tablet (30 mg total) by mouth daily. 05/06/15   Rhonda G Barrett, PA-C  losartan (COZAAR) 25 MG tablet Take 1 tablet (25 mg total) by mouth daily. 05/06/15   Evelene Croon Barrett, PA-C  metoprolol (LOPRESSOR) 50 MG tablet Take 1 tablet (50 mg total) by mouth 2 (two) times daily. 12/08/14   Erlene Quan, PA-C  nitrofurantoin, macrocrystal-monohydrate, (MACROBID) 100 MG capsule Take 1 capsule (100 mg total) by mouth 2 (two) times daily. 04/18/15    Kelvin Cellar, MD  nitroGLYCERIN (NITROSTAT) 0.4 MG SL tablet Place 1 tablet (0.4 mg total) under the tongue every 5 (five) minutes as needed for chest pain. 12/08/14   Erlene Quan, PA-C  NON FORMULARY Inhale 1 application into the lungs at bedtime. CPAP    Historical Provider, MD  omeprazole (PRILOSEC) 20 MG capsule Take 20 mg by mouth daily.    Historical Provider, MD  oxybutynin (DITROPAN) 5 MG tablet Take 5 mg by mouth daily as needed for bladder spasms.    Historical Provider, MD  Sennosides 25 MG TABS Take 25 mg by mouth daily as needed (for constipation).    Historical Provider,  MD   BP 135/64 mmHg  Pulse 64  Temp(Src) 98.5 F (36.9 C)  Resp 16  Wt 330 lb (149.687 kg)  SpO2 98% Physical Exam  Constitutional: He is oriented to person, place, and time. He appears well-developed. No distress.  Morbidly obese elderly male sitting upright in bed, speaking clearly.   HENT:  Head: Normocephalic and atraumatic.  Eyes: Conjunctivae and EOM are normal.  Cardiovascular: Normal rate and regular rhythm.   Pulmonary/Chest: Effort normal. No stridor. No respiratory distress.  Abdominal:  Mild diffuse tenderness, multiple abdominal scars, multiple areas of ecchymosis  Musculoskeletal: He exhibits no edema.  Neurological: He is alert and oriented to person, place, and time.  Skin: Skin is warm and dry.  Psychiatric: He has a normal mood and affect.  Nursing note and vitals reviewed.   ED Course  Procedures (including critical care time) Labs Review Labs Reviewed  CBC WITH DIFFERENTIAL/PLATELET - Abnormal; Notable for the following:    HCT 38.8 (*)    Eosinophils Relative 8 (*)    All other components within normal limits  COMPREHENSIVE METABOLIC PANEL - Abnormal; Notable for the following:    Glucose, Bld 200 (*)    Calcium 8.7 (*)    Total Protein 6.0 (*)    Albumin 2.8 (*)    ALT 15 (*)    All other components within normal limits  LIPASE, BLOOD - Abnormal; Notable for the  following:    Lipase 19 (*)    All other components within normal limits  URINALYSIS, ROUTINE W REFLEX MICROSCOPIC (NOT AT [facility]) - Abnormal; Notable for the following:    Hgb urine dipstick SMALL (*)    Protein, ur 100 (*)    All other components within normal limits  I-STAT CHEM 8, ED - Abnormal; Notable for the following:    Chloride 98 (*)    Glucose, Bld 202 (*)    All other components within normal limits  PROTIME-INR  URINE MICROSCOPIC-ADD ON    Imaging Review Ct Abdomen Pelvis W Contrast  05/12/2015   CLINICAL DATA:  Acute onset of lower abdominal pain for 3 days. No bowel movements for 10 days. Initial encounter.  EXAM: CT ABDOMEN AND PELVIS WITH CONTRAST  TECHNIQUE: Multidetector CT imaging of the abdomen and pelvis was performed using the standard protocol following bolus administration of intravenous contrast.  CONTRAST:  144mL OMNIPAQUE IOHEXOL 300 MG/ML  SOLN  COMPARISON:  CT of the abdomen and pelvis from 11/17/2014  FINDINGS: The visualized lung bases are clear. The patient is status post gastroplasty. Scattered coronary artery calcification is noted.  The liver and spleen are unremarkable in appearance. The patient is status post cholecystectomy, with clips noted along the gallbladder fossa. The pancreas and adrenal glands are unremarkable.  Nonspecific perinephric stranding is noted bilaterally. Small bilateral renal cysts are seen. The kidneys are otherwise unremarkable. There is no evidence of hydronephrosis. No renal or ureteral stones are seen.  No free fluid is identified. The small bowel is unremarkable in appearance. The stomach is within normal limits. No acute vascular abnormalities are seen. Scattered calcification is noted along the abdominal aorta and its branches.  The appendix is normal in caliber, without evidence of appendicitis. The colon is partially filled with stool. There is mild wall thickening along the rectum, with mild presacral stranding, concerning for mild  proctitis.  The bladder is decompressed, with a Foley catheter in place. Air within the bladder likely reflects Foley catheter placement. The prostate remains normal  in size. No inguinal lymphadenopathy is seen.  No acute osseous abnormalities are identified. There is grade 1 anterolisthesis of L4 on L5, with underlying vacuum phenomenon and endplate sclerotic change. There is mild chronic anterior wedging at vertebral body T11.  IMPRESSION: 1. Mild wall thickening along the rectum, with mild presacral stranding, concerning for mild proctitis. A small to moderate amount of stool is noted in the colon. 2. Scattered coronary artery calcifications seen. 3. Small bilateral renal cysts noted. 4. Scattered calcification along the abdominal aorta and its branches.   Electronically Signed   By: Garald Balding M.D.   On: 05/12/2015 00:37   I have personally reviewed and evaluated these images and lab results as part of my medical decision-making.   After the initial evaluation nursing staff notified Patient did have one bowel movement.  On repeat exam patient is in no distress, is aware of all results. He will be returned to his nursing facility. MDM  Patient presents with concern of abdominal pain, decreased bowel movements. The patient does have 1 bowel movement here, and CT scan does not demonstrate acute obstruction. There is some evidence for proctitis, and was otherwise reassuring findings, the patient was appropriate for discharge with outpatient follow-up.  Carmin Muskrat, MD 05/12/15 [phone]

## 2015-05-12 ENCOUNTER — Emergency Department (HOSPITAL_COMMUNITY): Payer: Medicare Other

## 2015-05-12 ENCOUNTER — Encounter (HOSPITAL_COMMUNITY): Payer: Self-pay

## 2015-05-12 MED ORDER — CIPROFLOXACIN HCL 500 MG PO TABS: 500.0000 mg | ORAL_TABLET | Freq: Two times a day (BID) | ORAL | Status: DC

## 2015-05-12 MED ORDER — CIPROFLOXACIN HCL 500 MG PO TABS
500.0000 mg | ORAL_TABLET | Freq: Once | ORAL | Status: AC
  Administered 2015-05-12: 500 mg via ORAL
  Filled 2015-05-12: qty 1

## 2015-05-12 MED ORDER — IOHEXOL 300 MG/ML  SOLN
100.0000 mL | Freq: Once | INTRAMUSCULAR | Status: AC | PRN
  Administered 2015-05-12: 100 mL via INTRAVENOUS

## 2015-05-12 NOTE — ED Notes (Signed)
[employer] notified of need for pt transport.

## 2015-05-12 NOTE — ED Notes (Signed)
Patient transported to CT 

## 2015-05-18 NOTE — Patient Outreach (Signed)
[facility]) Care Management  05/18/2015  [name] [place] [birth year] [id number]   Late entry for 05/11/2015  Patient was discharged to SNF from [facility] stay.  Referral forwarded to [facility], LCSW for follow up during SNF stay.  Thanks, Ronnell Freshwater. [place], [place] Assistant Phone: [phone] Fax: [phone]

## 2015-05-20 ENCOUNTER — Encounter: Payer: Self-pay | Admitting: *Deleted

## 2015-05-20 ENCOUNTER — Other Ambulatory Visit: Payer: Self-pay | Admitting: *Deleted

## 2015-05-20 ENCOUNTER — Ambulatory Visit: Payer: Medicare Other | Admitting: *Deleted

## 2015-05-20 ENCOUNTER — Ambulatory Visit: Payer: Self-pay | Admitting: *Deleted

## 2015-05-20 NOTE — Patient Outreach (Addendum)
[facility]) Care Management  [facility] Social Work  05/20/2015  [name] 10-Jun-1946 [id number]  Subjective:    "I might need some help at home when I leave the facility because I live alone and my friend works in [place] during the day".  Objective:   [name] agreed to assist patient with arranging home care services, with an agency of choice, upon discharge from [place] [place] where patient currently resides to receive rehabilitative services.  Current Medications:  Current Outpatient Prescriptions  Medication Sig Dispense Refill  . acetaminophen (TYLENOL) 325 MG tablet Take 2 tablets (650 mg total) by mouth every 4 (four) hours as needed for headache or mild pain.    Marland Kitchen aspirin EC 81 MG tablet Take 81 mg by mouth every morning.     . calcium carbonate (TUMS - DOSED IN MG ELEMENTAL CALCIUM) 500 MG chewable tablet Chew 1 tablet (200 mg of elemental calcium total) by mouth 3 (three) times daily as needed for indigestion or heartburn.    . ciprofloxacin (CIPRO) 500 MG tablet Take 1 tablet (500 mg total) by mouth every 12 (twelve) hours. 10 tablet 0  . diphenoxylate-atropine (LOMOTIL) 2.5-0.025 MG per tablet Take 1 tablet by mouth 3 (three) times daily as needed. For diarrhea 90 tablet 0  . ezetimibe (ZETIA) 10 MG tablet Take 1 tablet (10 mg total) by mouth daily. (Patient not taking: Reported on 05/11/2015) 30 tablet 11  . fexofenadine (ALLEGRA) 180 MG tablet Take 180 mg by mouth daily as needed for allergies or rhinitis.    . furosemide (LASIX) 40 MG tablet Take 1 tablet (40 mg total) by mouth daily. 30 tablet 11  . HYDROcodone-acetaminophen (NORCO/VICODIN) 5-325 MG per tablet Take 1 tablet by mouth every 6 (six) hours as needed for severe pain. 10 tablet 0  . insulin aspart (NOVOLOG) 100 UNIT/ML injection Inject 3 Units into the skin 4 (four) times daily -  before meals and at bedtime.    . insulin NPH Human (HUMULIN N)  100 UNIT/ML injection Inject 0.2 mLs (20 Units total) into the skin 2 (two) times daily at 8 am and 10 pm. 10 mL 11  . isosorbide mononitrate (IMDUR) 30 MG 24 hr tablet Take 1 tablet (30 mg total) by mouth daily. 30 tablet 11  . losartan (COZAAR) 25 MG tablet Take 1 tablet (25 mg total) by mouth daily. 30 tablet 11  . metoprolol (LOPRESSOR) 50 MG tablet Take 1 tablet (50 mg total) by mouth 2 (two) times daily.    . nitrofurantoin, macrocrystal-monohydrate, (MACROBID) 100 MG capsule Take 1 capsule (100 mg total) by mouth 2 (two) times daily. 10 capsule 0  . nitroGLYCERIN (NITROSTAT) 0.4 MG SL tablet Place 1 tablet (0.4 mg total) under the tongue every 5 (five) minutes as needed for chest pain. 25 tablet 2  . omeprazole (PRILOSEC) 20 MG capsule Take 20 mg by mouth daily.    Marland Kitchen oxybutynin (DITROPAN) 5 MG tablet Take 5 mg by mouth daily as needed for bladder spasms.    Marland Kitchen senna-docusate (SENOKOT-S) 8.6-50 MG per tablet Take 2 tablets by mouth daily. 28 tablet 0   No current facility-administered medications for this visit.    Functional Status:  In your present state of health, do you have any difficulty performing the following activities: 05/20/2015 05/01/2015  Hearing? N N  Vision? N N  Difficulty concentrating or making decisions? N N  Walking or climbing stairs? [name]  Dressing or bathing? Y Y  Doing errands, shopping? Y Y  Preparing Food and eating ? N -  Using the Toilet? N -  In the past six months, have you accidently leaked urine? N -  Do you have problems with loss of bowel control? N -  Managing your Medications? N -  Managing your Finances? N -  Housekeeping or managing your Housekeeping? Y -    Fall/Depression Screening:  PHQ 2/9 Scores 05/20/2015  PHQ - 2 Score 0    Assessment:   [name] was able to meet with patient today to perform the initial visit, as well as assess and assist with social work needs and services.  [name] met with patient at [facility], where patient currently resides to received rehabilitative services.  [name] introduced self, explained role and types of services provided through [facility] ([facility]).  [name] further explained to patient that [name] works with patient's RNCM, Monica Lane also with [facility]. [name] then explained the reason for the call, indicating that [name] was there to assist patient with discharge planning services upon discharge from [facility].  [name] obtained two HIPAA compliant identifiers from patient, which included patient's name and date of birth. Patient admitted to [name] that he currently lives at home alone, but may benefit from home care services, at least initially, upon release from the facility.  Patient went on to say that he has a friend, [name] that is available to offer assistance in the evenings and on the weekends; however, [name] works in [place] during the day, five days per week.  Patient has very limited social support, but was not interested in receiving resources or a referral to community agencies that can offer socialization.  [name] provided patient with a list of home health agencies that can offer assistance with activities of daily living, as well as assist patient with light house-keeping duties, grocery shopping, meal preparation, laundry services, etc.  [name] encouraged patient to review the list of agencies and make calls within the next two weeks to discuss available services, as well as rates.  [name] will follow-up with patient in two weeks to obtain the list of agencies of choice so that [name] can make a referral for patient in order for services to be in place, prior to patient returning home to live independently.  Patient reports he already has all the durable medical equipment he needs in the home.  No additional social work needs identified at this time.  Plan:   [name] will contact patient  on Friday, September 23rd to follow-up regarding arranging home care services upon discharge from the skilled nursing facility where patient currently resides. [name] will converse with patient's RNCM with [facility], Monica Lane to report findings of initial visit with patient today, as well as notify Mrs. Lane of patient's tentative discharge date from the skilled nursing facility where patient currently resides. [name] will fax a barriers letter and a correspondence letter to patient's Primary Care Physician, Dr. John Griffin to ensure that Dr. Griffin is aware of [name]'s involvement with patient. [name] will prescribe and print [name] information pertaining to patient's health conditions to review with patient during the next scheduled visit.   , BSW, MSW, LCSW  Licensed Clinical Social Worker  [facility]  Mailing Address-[address], [place], [place] [address] Physical Address-[address], [place], [place] [address] Toll Free Main # [phone] Fax # [phone] Cell # [phone]  Fax # [phone]  [email or web address]   

## 2015-05-24 ENCOUNTER — Encounter: Payer: Medicare Other | Admitting: Cardiology

## 2015-05-25 ENCOUNTER — Encounter: Payer: Self-pay | Admitting: Cardiology

## 2015-05-29 ENCOUNTER — Emergency Department (HOSPITAL_COMMUNITY)
Admission: EM | Admit: 2015-05-29 | Discharge: 2015-05-29 | Disposition: A | Discharge status: Home / Self Care | Payer: Medicare Other | Attending: Emergency Medicine | Admitting: Emergency Medicine

## 2015-05-29 ENCOUNTER — Encounter (HOSPITAL_COMMUNITY): Payer: Self-pay | Admitting: Emergency Medicine

## 2015-05-29 DIAGNOSIS — I10 Essential (primary) hypertension: Secondary | ICD-10-CM | POA: Diagnosis not present

## 2015-05-29 DIAGNOSIS — R0789 Other chest pain: Secondary | ICD-10-CM | POA: Diagnosis not present

## 2015-05-29 DIAGNOSIS — E669 Obesity, unspecified: Secondary | ICD-10-CM | POA: Insufficient documentation

## 2015-05-29 DIAGNOSIS — I25119 Atherosclerotic heart disease of native coronary artery with unspecified angina pectoris: Secondary | ICD-10-CM | POA: Diagnosis not present

## 2015-05-29 DIAGNOSIS — Z79899 Other long term (current) drug therapy: Secondary | ICD-10-CM | POA: Insufficient documentation

## 2015-05-29 DIAGNOSIS — I509 Heart failure, unspecified: Secondary | ICD-10-CM | POA: Insufficient documentation

## 2015-05-29 DIAGNOSIS — K219 Gastro-esophageal reflux disease without esophagitis: Secondary | ICD-10-CM | POA: Diagnosis not present

## 2015-05-29 DIAGNOSIS — E1140 Type 2 diabetes mellitus with diabetic neuropathy, unspecified: Secondary | ICD-10-CM | POA: Diagnosis not present

## 2015-05-29 DIAGNOSIS — Z792 Long term (current) use of antibiotics: Secondary | ICD-10-CM | POA: Insufficient documentation

## 2015-05-29 DIAGNOSIS — Z87891 Personal history of nicotine dependence: Secondary | ICD-10-CM | POA: Diagnosis not present

## 2015-05-29 DIAGNOSIS — Z8673 Personal history of transient ischemic attack (TIA), and cerebral infarction without residual deficits: Secondary | ICD-10-CM | POA: Insufficient documentation

## 2015-05-29 DIAGNOSIS — E785 Hyperlipidemia, unspecified: Secondary | ICD-10-CM | POA: Insufficient documentation

## 2015-05-29 DIAGNOSIS — Z86718 Personal history of other venous thrombosis and embolism: Secondary | ICD-10-CM | POA: Insufficient documentation

## 2015-05-29 DIAGNOSIS — I25759 Atherosclerosis of native coronary artery of transplanted heart with unspecified angina pectoris: Secondary | ICD-10-CM | POA: Insufficient documentation

## 2015-05-29 DIAGNOSIS — G8929 Other chronic pain: Secondary | ICD-10-CM | POA: Insufficient documentation

## 2015-05-29 DIAGNOSIS — M1990 Unspecified osteoarthritis, unspecified site: Secondary | ICD-10-CM | POA: Insufficient documentation

## 2015-05-29 DIAGNOSIS — Z794 Long term (current) use of insulin: Secondary | ICD-10-CM | POA: Insufficient documentation

## 2015-05-29 DIAGNOSIS — R079 Chest pain, unspecified: Secondary | ICD-10-CM | POA: Diagnosis not present

## 2015-05-29 DIAGNOSIS — Z8701 Personal history of pneumonia (recurrent): Secondary | ICD-10-CM | POA: Insufficient documentation

## 2015-05-29 DIAGNOSIS — Z7982 Long term (current) use of aspirin: Secondary | ICD-10-CM | POA: Insufficient documentation

## 2015-05-29 DIAGNOSIS — R011 Cardiac murmur, unspecified: Secondary | ICD-10-CM | POA: Insufficient documentation

## 2015-05-29 DIAGNOSIS — E119 Type 2 diabetes mellitus without complications: Secondary | ICD-10-CM | POA: Insufficient documentation

## 2015-05-29 LAB — COMPREHENSIVE METABOLIC PANEL
ALBUMIN: 3 g/dL — AB (ref 3.5–5.0)
ALT: 23 U/L (ref 17–63)
ANION GAP: 9 (ref 5–15)
AST: 23 U/L (ref 15–41)
Alkaline Phosphatase: 108 U/L (ref 38–126)
BILIRUBIN TOTAL: 0.5 mg/dL (ref 0.3–1.2)
BUN: 13 mg/dL (ref 6–20)
CO2: 27 mmol/L (ref 22–32)
Calcium: 8.6 mg/dL — ABNORMAL LOW (ref 8.9–10.3)
Chloride: 95 mmol/L — ABNORMAL LOW (ref 101–111)
Creatinine, Ser: 0.82 mg/dL (ref 0.61–1.24)
GFR calc Af Amer: >60 mL/min (ref >60)
GLUCOSE: 192 mg/dL — AB (ref 65–99)
POTASSIUM: 4.3 mmol/L (ref 3.5–5.1)
Sodium: 131 mmol/L — ABNORMAL LOW (ref 135–145)
TOTAL PROTEIN: 6.5 g/dL (ref 6.5–8.1)

## 2015-05-29 LAB — LIPASE, BLOOD: LIPASE: 22 U/L (ref 22–51)

## 2015-05-29 LAB — CBC WITH DIFFERENTIAL/PLATELET
BASOS PCT: 1 %
Basophils Absolute: 0.1 10*3/uL (ref 0.0–0.1)
EOS ABS: 0.6 10*3/uL (ref 0.0–0.7)
Eosinophils Relative: 7 %
HCT: 41.9 % (ref 39.0–52.0)
HEMOGLOBIN: 14.3 g/dL (ref 13.0–17.0)
Lymphocytes Relative: 16 %
Lymphs Abs: 1.3 10*3/uL (ref 0.7–4.0)
MCH: 29.6 pg (ref 26.0–34.0)
MCHC: 34.1 g/dL (ref 30.0–36.0)
MCV: 86.7 fL (ref 78.0–100.0)
Monocytes Absolute: 0.6 10*3/uL (ref 0.1–1.0)
Monocytes Relative: 8 %
NEUTROS PCT: 68 %
Neutro Abs: 5.6 10*3/uL (ref 1.7–7.7)
Platelets: 193 10*3/uL (ref 150–400)
RBC: 4.83 MIL/uL (ref 4.22–5.81)
RDW: 13.6 % (ref 11.5–15.5)
WBC: 8.2 10*3/uL (ref 4.0–10.5)

## 2015-05-29 LAB — TROPONIN I

## 2015-05-29 MED ORDER — FAMOTIDINE 20 MG PO TABS
20.0000 mg | ORAL_TABLET | Freq: Once | ORAL | Status: AC
Start: 2015-05-29 — End: 2015-05-29
  Administered 2015-05-29: 20 mg via ORAL
  Filled 2015-05-29: qty 1

## 2015-05-29 NOTE — ED Notes (Signed)
[facility] arrived for transport 

## 2015-05-29 NOTE — ED Provider Notes (Signed)
CSN: [id number]     Arrival date & time 05/29/15  1556 History   First MD Initiated Contact with Patient 05/29/15 1605     Chief Complaint  Patient presents with  . Chest Pain     (Consider location/radiation/quality/duration/timing/severity/associated sxs/prior Treatment) HPI Patient reports he's been having some chest pains. He has a hard time quantifying how long they've been there. He gets different types of chest pain intermittently. He says this was not really a sharp pain. It's "little pains" coming and going. He states "this one isn't any worse than 5 or 6." Patient apparently had a large box [employer] last night. He started to get some discomfort at that time and reportedly got relief with nitroglycerin and Maalox. Patient had a large lunch today and the chest pain returned. Zantac provided at [facility] did not help with the patient's symptoms. On route he was given 3 nitroglycerin and aspirin by EMS without relief of pain. Past Medical History  Diagnosis Date  . Obese   . Hypertension   . Diabetic neuropathy, painful   . Chronic indwelling Foley catheter   . Bladder spasms   . Hyperlipidemia   . GERD (gastroesophageal reflux disease)   . Neurogenic bladder   . CAD (coronary artery disease)   . PONV (postoperative nausea and vomiting)   . CHF (congestive heart failure)   . DVT (deep venous thrombosis) 1980's    LLE  . Type II diabetes mellitus   . Diabetic diarrhea   . Pneumonia 1999  . OSA (obstructive sleep apnea)     "they wanted me to wear a mask; I couldn't" (11/10/2014)  . Arthritis     "hands, ankles, knees" (11/10/2014)  . Chronic lower back pain   . Stroke 10/2011; 06/2014    right hand numbness/notes 10/20/2011, pt not sure he had stroke in 2013; "right hand weaker and right face not quite right since" (11/10/2014)   Past Surgical History  Procedure Laterality Date  . Gastroplasty    . Total knee arthroplasty Right 1990's  . Left heart catheterization with coronary  angiogram N/A 10/24/2011    Procedure: LEFT HEART CATHETERIZATION WITH CORONARY ANGIOGRAM;  Surgeon: Candee Furbish, MD;  Location[facility] CATH LAB;  Service: Cardiovascular;  Laterality: N/A;  right radial artery approach  . Cholecystectomy open  1970's?  . Joint replacement    . Carpal tunnel release Bilateral ~ 2003-2004  . Cardiac catheterization N/A 05/02/2015    Procedure: Left Heart Cath and Coronary Angiography;  Surgeon: Lorretta Harp, MD;  Location: [place] CV LAB;  Service: Cardiovascular;  Laterality: N/A;  . Cardiac catheterization N/A 05/04/2015    Procedure: Left Heart Cath and Coronary Angiography;  Surgeon: Wellington Hampshire, MD;  Location: [place] [facility] LAB;  Service: Cardiovascular;  Laterality: N/A;   Family History  Problem Relation Age of Onset  . Hypertension Mother   . Diabetes type I Father   . Cancer Brother      Testicular Cancer  . Cancer Brother     Leukemia   Social History  Substance Use Topics  . Smoking status: Former Smoker -- 2.00 packs/day for 20 years    Types: Cigarettes    Quit date: 09/10/1976  . Smokeless tobacco: Never Used  . 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   Allergies  Ace inhibitors; Lipitor; Metformin and related; Cefadroxil; Cephalexin; Morphine and related; and Robaxin  Home Medications   Prior to Admission medications   Medication Sig Start Date End Date Taking? Authorizing Provider  acetaminophen (TYLENOL) 325 MG tablet Take 2 tablets (650 mg total) by mouth every 4 (four) hours as needed for headache or mild pain. 12/08/14  Yes Erlene Quan, PA-C  aspirin EC 81 MG tablet Take 81 mg by mouth every morning.    Yes Historical Provider, MD  calcium carbonate (TUMS - DOSED IN MG ELEMENTAL CALCIUM) 500 MG chewable tablet Chew 1 tablet (200 mg of elemental calcium total) by mouth 3 (three)  times daily as needed for indigestion or heartburn. 11/19/14  Yes Debbe Odea, MD  ciprofloxacin (CIPRO) 500 MG tablet Take 1 tablet (500 mg total) by mouth every 12 (twelve) hours. 05/12/15  Yes Carmin Muskrat, MD  diphenoxylate-atropine (LOMOTIL) 2.5-0.025 MG per tablet Take 1 tablet by mouth 3 (three) times daily as needed. For diarrhea 09/06/14  Yes Tiffany L Reed, DO  fexofenadine (ALLEGRA) 180 MG tablet Take 180 mg by mouth daily as needed for allergies or rhinitis.   Yes Historical Provider, MD  furosemide (LASIX) 40 MG tablet Take 1 tablet (40 mg total) by mouth daily. 04/03/14  Yes Lavone Orn, MD  HYDROcodone-acetaminophen (NORCO/VICODIN) 5-325 MG per tablet Take 1 tablet by mouth every 6 (six) hours as needed for severe pain. 05/06/15  Yes Rhonda G Barrett, PA-C  insulin aspart (NOVOLOG) 100 UNIT/ML injection Inject 3 Units into the skin 4 (four) times daily -  before meals and at bedtime.   Yes Historical Provider, MD  insulin NPH Human (HUMULIN N) 100 UNIT/ML injection Inject 0.2 mLs (20 Units total) into the skin 2 (two) times daily at 8 am and 10 pm. 04/18/15  Yes Kelvin Cellar, MD  isosorbide mononitrate (IMDUR) 30 MG 24 hr tablet Take 1 tablet (30 mg total) by mouth daily. 05/06/15  Yes Rhonda G Barrett, PA-C  losartan (COZAAR) 25 MG tablet Take 1 tablet (25 mg total) by mouth daily. 05/06/15  Yes Rhonda G Barrett, PA-C  metoprolol (LOPRESSOR) 50 MG tablet Take 1 tablet (50 mg total) by mouth 2 (two) times daily. 12/08/14  Yes Luke K Kilroy, PA-C  nitroGLYCERIN (NITROSTAT) 0.4 MG SL tablet Place 1 tablet (0.4 mg total) under the tongue every 5 (five) minutes as needed for chest pain. 12/08/14  Yes Luke K Kilroy, PA-C  omeprazole (PRILOSEC) 20 MG capsule Take 20 mg by mouth daily.   Yes Historical Provider, MD  oxybutynin (DITROPAN) 5 MG tablet Take 5 mg by mouth daily as needed for bladder spasms.   Yes Historical Provider, MD  PRESCRIPTION MEDICATION Inhale 1 application into the lungs at  bedtime. CPAP   Yes Historical Provider, MD  senna-docusate (SENOKOT-S) 8.6-50 MG per tablet Take 2 tablets by mouth daily. Patient taking differently: Take 2 tablets by mouth 2 (two) times daily.  05/11/15  Yes Carmin Muskrat, MD  ezetimibe (ZETIA) 10 MG tablet Take 1 tablet (10 mg total) by mouth daily. Patient not taking: Reported on 05/11/2015 05/06/15   Evelene Croon Barrett, PA-C  nitrofurantoin, macrocrystal-monohydrate, (MACROBID) 100 MG capsule Take 1 capsule (100 mg total) by mouth 2 (two) times daily. 04/18/15   Kelvin Cellar, MD   SpO2 97% Physical Exam  Constitutional: He is oriented to person, place, and time.  Patient is morbidly obese. He is alert and nontoxic. No respiratory distress.  HENT:  Head: Normocephalic and atraumatic.  Mouth/Throat: Oropharynx is clear and moist.  Eyes: EOM are normal.  Cardiovascular: Normal rate, regular rhythm and intact distal pulses.   2/6 systolic ejection murmur  Pulmonary/Chest: Effort normal and breath sounds normal. No respiratory distress.  Abdominal: Soft. He exhibits no distension. There is no tenderness.  Musculoskeletal:  Patient has thinning of skin on the lower extremities and hyperpigmentation consistent with chronic venous stasis. Bilateral 2+ edema. No evidence of active cellulitis.  Neurological: He is alert and oriented to person, place, and time. Coordination normal.  Skin: Skin is warm and dry.  Psychiatric: He has a normal mood and affect.    ED Course  Procedures (including critical care time) Labs Review Labs Reviewed  COMPREHENSIVE METABOLIC PANEL - Abnormal; Notable for the following:    Sodium 131 (*)    Chloride 95 (*)    Glucose, Bld 192 (*)    Calcium 8.6 (*)    Albumin 3.0 (*)    All other components within normal limits  LIPASE, BLOOD  TROPONIN I  CBC WITH DIFFERENTIAL/PLATELET    Imaging Review No results found. I have personally reviewed and evaluated these images and lab results as part of my  medical decision-making.   EKG Interpretation   Date/Time:  Sunday May 29 2015 16:04:49 EDT Ventricular Rate:  89 PR Interval:  221 QRS Duration: 114 QT Interval:  371 QTC Calculation: 451 R Axis:   -55 Text Interpretation:  Sinus rhythm Prolonged PR interval Incomplete left  bundle branch block Probable left ventricular hypertrophy no change from  prior Confirmed by Johnney Killian, MD, Jeannie Done [phone]) on 05/29/2015 4:13:49 PM      MDM   Final diagnoses:  Chest pain, unspecified chest pain type  Coronary artery disease involving native coronary artery of native heart with angina pectoris  Gastroesophageal reflux disease, esophagitis presence not specified   Patient is alert and well appearance. He has been having intermittent chest pains for some time. He did have a more recent admission and cardiac catheterization. The recommendation was for medical management of known coronary artery disease. At this time there is no evidence of acute MI. Patient may have anginal chest pain however GI component is also certainly a good  Possibility. There are no EKG changes today. The patient's vital signs are stable. At this point I do feel he can be discharged back to nursing home care with follow-up as an outpatient with his primary provider and cardiologist if needed.    Charlesetta Shanks, MD 05/29/15 [phone]

## 2015-05-29 NOTE — Discharge Instructions (Signed)

## 2015-05-29 NOTE — ED Notes (Addendum)
From [facility]: CP that started last night after eating a big box of [employer] chicken. Took two nitro and Maalox with good relief. After a large meal for lunch, CP returned. Given Zantac 150mg  at [facility] at 1425 without relief.  Given 3 nitro and 324mg  of ASA in route by EMS. No relief of pain.

## 2015-06-03 ENCOUNTER — Other Ambulatory Visit: Payer: Self-pay | Admitting: *Deleted

## 2015-06-04 ENCOUNTER — Encounter: Payer: Self-pay | Admitting: *Deleted

## 2015-06-04 NOTE — Patient Outreach (Signed)
[facility]) Care Management  06/04/2015  [name] 07/27/1946 [id number]   [name] was able to make contact with patient today to follow-up regarding patient's placement at [facility], as well as to discuss possible discharge planning needs and services.  [name] was able to obtain two HIPAA compliant identifiers from patient, which included patient's name and date of birth.  [name] inquired as to whether or not patient has a tentative discharge date from [facility] [place] where patient currently resides to receive rehabilitative services.  Patient denied, admitting that he has no plans to return home at present, as patient would be forced to live independently.  Patient went on to say that there is no one available to provide 24 hour care and supervision.  Patient indicated that he is not happy about possibly having to remain at [facility] for long-term care, but realizes that he is not capable of performing activities of daily living independently, nor is patient able to private pay for home health aide services and/or a sitters. [name] provided patient with [name]'s contact information, encouraging patient to contact [name] directly if he changes his mind or if his discharge plans change.  [name] explained to patient that [name] would no longer be making contact with patient to offer social work services.  Patient voiced understanding and was agreeable to this plan.  [name] will perform a case closure on patient, as all goals of treatment have been met from social work standpoint and no additional social work needs have been identified at this time.  [name] will notify patient's RNCM with [name], [name] of [name]'s plans to close patient's case.  [name] will fax a correspondence letter to patient's Primary Care Physician, [name] to ensure that [name] is aware of [name]'s involvement with patient.  [name] will submit a case  closure request to [name], Care Management Assistant with [name], in the form of a message.    [name], BSW, MSW, LCSW  Licensed Education officer, [facility]  [place] [place] [address] [place], [place] [address] Physical Address-[address], [place], [place] [address] Toll Free Main # [phone] Fax # [phone] Cell # [phone]  Fax # [phone]  [name]

## 2015-06-07 ENCOUNTER — Encounter: Payer: Self-pay | Admitting: Internal Medicine

## 2015-06-07 ENCOUNTER — Non-Acute Institutional Stay (SKILLED_NURSING_FACILITY): Payer: Medicare Other | Admitting: Internal Medicine

## 2015-06-07 DIAGNOSIS — N319 Neuromuscular dysfunction of bladder, unspecified: Secondary | ICD-10-CM | POA: Diagnosis not present

## 2015-06-07 DIAGNOSIS — G4733 Obstructive sleep apnea (adult) (pediatric): Secondary | ICD-10-CM

## 2015-06-07 DIAGNOSIS — I25119 Atherosclerotic heart disease of native coronary artery with unspecified angina pectoris: Secondary | ICD-10-CM | POA: Diagnosis not present

## 2015-06-07 DIAGNOSIS — K219 Gastro-esophageal reflux disease without esophagitis: Secondary | ICD-10-CM

## 2015-06-07 DIAGNOSIS — E1159 Type 2 diabetes mellitus with other circulatory complications: Secondary | ICD-10-CM | POA: Diagnosis not present

## 2015-06-07 DIAGNOSIS — E1151 Type 2 diabetes mellitus with diabetic peripheral angiopathy without gangrene: Secondary | ICD-10-CM

## 2015-06-07 DIAGNOSIS — IMO0002 Reserved for concepts with insufficient information to code with codable children: Secondary | ICD-10-CM

## 2015-06-07 DIAGNOSIS — E1165 Type 2 diabetes mellitus with hyperglycemia: Secondary | ICD-10-CM

## 2015-06-07 DIAGNOSIS — I10 Essential (primary) hypertension: Secondary | ICD-10-CM

## 2015-06-07 DIAGNOSIS — R5381 Other malaise: Secondary | ICD-10-CM

## 2015-06-07 DIAGNOSIS — I5032 Chronic diastolic (congestive) heart failure: Secondary | ICD-10-CM

## 2015-06-07 NOTE — Progress Notes (Signed)
Patient ID: [name], male   DOB: 20-Sep-1945, 69 y.o.   MRN: [id number]    DATE: 06/07/15  Location:  [place] [facility] Service: SNF [phone])   Extended Emergency Contact Information Primary Emergency Contact: Whiting,[name] Address: [address]          [place], [place] [address] [name] of [place] Phone: [phone] Relation: Friend Secondary Emergency Contact: [place] of [place] Phone: [phone] Relation: None  Advanced Directive information  FULL CODE  Chief Complaint  Patient presents with  . Discharge Note    HPI:  69 yo male seen today for d/c from SNF. He has tolerated PT and is now medically stable to go home. He reports occasional angina but it resolves spontaneously. He has not used SL NTG in several days. Occasional SOB. No palpitations. He does not use CPAP at night and would like it d/c'd. CBG stable with today's reading 163. No low BS reactions. He has a chronic foley due to neurogenic bladder. No nursing issues. Sleeps ok at night. No falls. Appetite good  RECAP: He was originally admitted to the hospital for UTI with change in mental status. He was tx with IV vanco and levaquin in the ED and was changed to nitrofurantoin due to C and s results. CT head neg for acute changes. His foley cath was changed. He was sent to SNF for rehab. 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 He had mild elevation of cardiac enzymes. Lexiscan stress test showed reversible ischemia and he was set up for cath. He had cardiac cath via right inguinal on 8/24th that showed RCA disease and improved dissection of RCA. EF 35-40%. Medical tx recommended. He had an EEG due to c/a absence sz which showed no significant changes.  Orthostatics not significant values. Head CT revealed no acute process. He was sent back to SNF to complete short term rehab  Past Medical History  Diagnosis Date  . Obese   . Hypertension   . Diabetic neuropathy, painful   . Chronic indwelling Foley catheter   . Bladder spasms   . Hyperlipidemia   . GERD (gastroesophageal reflux disease)   . Neurogenic bladder   . CAD (coronary artery disease)   . PONV (postoperative nausea and vomiting)   . CHF (congestive heart failure)   . DVT (deep venous thrombosis) 1980's    LLE  . Type II diabetes mellitus   . Diabetic diarrhea   . Pneumonia 1999  . OSA (obstructive sleep apnea)     "they wanted me to wear a mask; I couldn't" (11/10/2014)  . Arthritis     "hands, ankles, knees" (11/10/2014)  . Chronic lower back pain   . Stroke 10/2011; 06/2014    right hand numbness/notes 10/20/2011, pt not sure he had stroke in 2013; "right hand weaker and right face not quite right since" (11/10/2014)    Past Surgical History  Procedure Laterality Date  . Gastroplasty    . Total knee arthroplasty Right 1990's  . Left heart catheterization with coronary angiogram N/A 10/24/2011    Procedure: LEFT HEART CATHETERIZATION WITH CORONARY ANGIOGRAM;  Surgeon: Candee Furbish, MD;  Location: [facility] CATH LAB;  Service: Cardiovascular;  Laterality: N/A;  right radial artery approach  .  Cholecystectomy open  1970's?  . Joint replacement    . Carpal tunnel release Bilateral ~ 2003-2004  . Cardiac catheterization N/A 05/02/2015    Procedure: Left Heart Cath and Coronary Angiography;  Surgeon: Lorretta Harp, MD;  Location: [facility] LAB;  Service: Cardiovascular;  Laterality: N/A;  . Cardiac catheterization N/A 05/04/2015    Procedure: Left Heart Cath and Coronary Angiography;  Surgeon: Wellington Hampshire, MD;  Location: [facility] LAB;  Service: Cardiovascular;  Laterality: N/A;    Patient Care Team: Lavone Orn, MD as PCP - General (Internal Medicine)  Social  History   Social History  . Marital Status: Widowed    Spouse Name: N/A  . Number of Children: N/A  . Years of Education: N/A   Occupational History  . Retired    Social History Main Topics  . Smoking status: Former Smoker -- 2.00 packs/day for 20 years    Types: Cigarettes    Quit date: 09/10/1976  . Smokeless tobacco: Never Used  . Alcohol Use: No     Comment: FORMER ALCOHOLIC; "sober since 38/18/2993"  . Drug Use: No  . Sexual Activity: No   Other Topics Concern  . Not on file   Social History Narrative     reports that he quit smoking about 38 years ago. His smoking use included Cigarettes. He has a 40 pack-year smoking history. He has never used smokeless tobacco. He reports that he does not drink alcohol or use illicit drugs.  Immunization History  Administered Date(s) Administered  . Influenza Split 10/12/2011, 10/13/2011  . Influenza,inj,Quad PF,36+ Mos 05/06/2015  . Influenza-Unspecified 06/10/2014    Allergies  Allergen Reactions  . Ace Inhibitors Swelling    Pt tolerates lisinopril  . Lipitor [Atorvastatin Calcium] Swelling  . Metformin And Related Swelling  . Cefadroxil Hives  . Cephalexin Hives  . Morphine And Related Other (See Comments)    Sweating, feels like is "in rocky boat."  . Robaxin [Methocarbamol] Other (See Comments)    Feels like he is shaky    Medications: Patient's Medications  New Prescriptions   No medications on file  Previous Medications   ACETAMINOPHEN (TYLENOL) 325 MG TABLET    Take 2 tablets (650 mg total) by mouth every 4 (four) hours as needed for headache or mild pain.   ASPIRIN EC 81 MG TABLET    Take 81 mg by mouth every morning.    CALCIUM CARBONATE (TUMS - DOSED IN MG ELEMENTAL CALCIUM) 500 MG CHEWABLE TABLET    Chew 1 tablet (200 mg of elemental calcium total) by mouth 3 (three) times daily as needed for indigestion or heartburn.   CIPROFLOXACIN (CIPRO) 500 MG TABLET    Take 1 tablet (500 mg total) by mouth every 12  (twelve) hours.   DIPHENOXYLATE-ATROPINE (LOMOTIL) 2.5-0.025 MG PER TABLET    Take 1 tablet by mouth 3 (three) times daily as needed. For diarrhea   EZETIMIBE (ZETIA) 10 MG TABLET    Take 1 tablet (10 mg total) by mouth daily.   FEXOFENADINE (ALLEGRA) 180 MG TABLET    Take 180 mg by mouth daily as needed for allergies or rhinitis.   FUROSEMIDE (LASIX) 40 MG TABLET    Take 1 tablet (40 mg total) by mouth daily.   HYDROCODONE-ACETAMINOPHEN (NORCO/VICODIN) 5-325 MG PER TABLET    Take 1 tablet by mouth every 6 (six) hours as needed for severe pain.   INSULIN ASPART (NOVOLOG) 100 UNIT/ML INJECTION    Inject  3 Units into the skin 4 (four) times daily -  before meals and at bedtime.   INSULIN NPH HUMAN (HUMULIN N) 100 UNIT/ML INJECTION    Inject 0.2 mLs (20 Units total) into the skin 2 (two) times daily at 8 am and 10 pm.   ISOSORBIDE MONONITRATE (IMDUR) 30 MG 24 HR TABLET    Take 1 tablet (30 mg total) by mouth daily.   LOSARTAN (COZAAR) 25 MG TABLET    Take 1 tablet (25 mg total) by mouth daily.   METOPROLOL (LOPRESSOR) 50 MG TABLET    Take 1 tablet (50 mg total) by mouth 2 (two) times daily.   NITROFURANTOIN, MACROCRYSTAL-MONOHYDRATE, (MACROBID) 100 MG CAPSULE    Take 1 capsule (100 mg total) by mouth 2 (two) times daily.   NITROGLYCERIN (NITROSTAT) 0.4 MG SL TABLET    Place 1 tablet (0.4 mg total) under the tongue every 5 (five) minutes as needed for chest pain.   OMEPRAZOLE (PRILOSEC) 20 MG CAPSULE    Take 20 mg by mouth daily.   OXYBUTYNIN (DITROPAN) 5 MG TABLET    Take 5 mg by mouth daily as needed for bladder spasms.   PRESCRIPTION MEDICATION    Inhale 1 application into the lungs at bedtime. CPAP   SENNA-DOCUSATE (SENOKOT-S) 8.6-50 MG PER TABLET    Take 2 tablets by mouth daily.  Modified Medications   No medications on file  Discontinued Medications   No medications on file    Review of Systems  Constitutional: Positive for activity change and fatigue. Negative for chills and unexpected  weight change.  HENT: Negative for sore throat and trouble swallowing.   Eyes: Negative for visual disturbance.  Respiratory: Positive for shortness of breath. Negative for cough and chest tightness.   Cardiovascular: Positive for chest pain. Negative for palpitations and leg swelling.  Gastrointestinal: Negative for nausea, vomiting, abdominal pain and blood in stool.  Genitourinary: Positive for difficulty urinating. Negative for urgency and frequency.  Musculoskeletal: Positive for arthralgias and gait problem.  Skin: Positive for rash.  Neurological: Positive for weakness. Negative for headaches.  Psychiatric/Behavioral: Negative for confusion and sleep disturbance. The patient is not nervous/anxious.     Filed Vitals:   06/07/15 2158  BP: 137/70  Pulse: 60  Temp: 98.1 F (36.7 C)  Weight: 345 lb (156.491 kg)  SpO2: 98%   Body mass index is 48.14 kg/(m^2).  Physical Exam  Constitutional: He is oriented to person, place, and time. He appears well-developed and well-nourished. No distress.  Neurological: He is alert and oriented to person, place, and time.  Psychiatric: His behavior is normal. Thought content normal.  Flat affect     Labs reviewed: Admission on 05/29/2015, Discharged on 05/29/2015  Component Date Value Ref Range Status  . Sodium 05/29/2015 131* 135 - 145 mmol/L Final  . Potassium 05/29/2015 4.3  3.5 - 5.1 mmol/L Final  . Chloride 05/29/2015 95* 101 - 111 mmol/L Final  . CO2 05/29/2015 27  22 - 32 mmol/L Final  . Glucose, Bld 05/29/2015 192* 65 - 99 mg/dL Final  . BUN 05/29/2015 13  6 - 20 mg/dL Final  . Creatinine, Ser 05/29/2015 0.82  0.61 - 1.24 mg/dL Final  . Calcium 05/29/2015 8.6* 8.9 - 10.3 mg/dL Final  . Total Protein 05/29/2015 6.5  6.5 - 8.1 g/dL Final  . Albumin 05/29/2015 3.0* 3.5 - 5.0 g/dL Final  . AST 05/29/2015 23  15 - 41 U/L Final  . ALT 05/29/2015 23  17 -  63 U/L Final  . Alkaline Phosphatase 05/29/2015 108  38 - 126 U/L Final  .  Total Bilirubin 05/29/2015 0.5  0.3 - 1.2 mg/dL Final  . GFR calc non Af Amer 05/29/2015 >60  >60 mL/min Final  . GFR calc Af Amer 05/29/2015 >60  >60 mL/min Final   Comment: (NOTE) The eGFR has been calculated using the CKD EPI equation. This calculation has not been validated in all clinical situations. eGFR's persistently <60 mL/min signify possible Chronic Kidney Disease.   . Anion gap 05/29/2015 9  5 - 15 Final  . Lipase 05/29/2015 22  22 - 51 U/L Final  . Troponin I 05/29/2015 <0.03  <0.031 ng/mL Final   Comment:        NO INDICATION OF MYOCARDIAL INJURY.   . WBC 05/29/2015 8.2  4.0 - 10.5 K/uL Final  . RBC 05/29/2015 4.83  4.22 - 5.81 MIL/uL Final  . Hemoglobin 05/29/2015 14.3  13.0 - 17.0 g/dL Final  . HCT 05/29/2015 41.9  39.0 - 52.0 % Final  . MCV 05/29/2015 86.7  78.0 - 100.0 fL Final  . MCH 05/29/2015 29.6  26.0 - 34.0 pg Final  . MCHC 05/29/2015 34.1  30.0 - 36.0 g/dL Final  . RDW 05/29/2015 13.6  11.5 - 15.5 % Final  . Platelets 05/29/2015 193  150 - 400 K/uL Final  . Neutrophils Relative % 05/29/2015 68   Final  . Neutro Abs 05/29/2015 5.6  1.7 - 7.7 K/uL Final  . Lymphocytes Relative 05/29/2015 16   Final  . Lymphs Abs 05/29/2015 1.3  0.7 - 4.0 K/uL Final  . Monocytes Relative 05/29/2015 8   Final  . Monocytes Absolute 05/29/2015 0.6  0.1 - 1.0 K/uL Final  . Eosinophils Relative 05/29/2015 7   Final  . Eosinophils Absolute 05/29/2015 0.6  0.0 - 0.7 K/uL Final  . Basophils Relative 05/29/2015 1   Final  . Basophils Absolute 05/29/2015 0.1  0.0 - 0.1 K/uL Final  Admission on 05/11/2015, Discharged on 05/12/2015  Component Date Value Ref Range Status  . WBC 05/11/2015 8.0  4.0 - 10.5 K/uL Final  . RBC 05/11/2015 4.46  4.22 - 5.81 MIL/uL Final  . Hemoglobin 05/11/2015 13.3  13.0 - 17.0 g/dL Final  . HCT 05/11/2015 38.8* 39.0 - 52.0 % Final  . MCV 05/11/2015 87.0  78.0 - 100.0 fL Final  . MCH 05/11/2015 29.8  26.0 - 34.0 pg Final  . MCHC 05/11/2015 34.3  30.0 -  36.0 g/dL Final  . RDW 05/11/2015 13.6  11.5 - 15.5 % Final  . Platelets 05/11/2015 186  150 - 400 K/uL Final  . Neutrophils Relative % 05/11/2015 63  43 - 77 % Final  . Neutro Abs 05/11/2015 5.1  1.7 - 7.7 K/uL Final  . Lymphocytes Relative 05/11/2015 20  12 - 46 % Final  . Lymphs Abs 05/11/2015 1.6  0.7 - 4.0 K/uL Final  . Monocytes Relative 05/11/2015 8  3 - 12 % Final  . Monocytes Absolute 05/11/2015 0.6  0.1 - 1.0 K/uL Final  . Eosinophils Relative 05/11/2015 8* 0 - 5 % Final  . Eosinophils Absolute 05/11/2015 0.6  0.0 - 0.7 K/uL Final  . Basophils Relative 05/11/2015 1  0 - 1 % Final  . Basophils Absolute 05/11/2015 0.1  0.0 - 0.1 K/uL Final  . Sodium 05/11/2015 135  135 - 145 mmol/L Final  . Potassium 05/11/2015 4.0  3.5 - 5.1 mmol/L Final  . Chloride 05/11/2015 103  101 - 111 mmol/L Final  . CO2 05/11/2015 26  22 - 32 mmol/L Final  . Glucose, Bld 05/11/2015 200* 65 - 99 mg/dL Final  . BUN 05/11/2015 12  6 - 20 mg/dL Final  . Creatinine, Ser 05/11/2015 0.74  0.61 - 1.24 mg/dL Final  . Calcium 05/11/2015 8.7* 8.9 - 10.3 mg/dL Final  . Total Protein 05/11/2015 6.0* 6.5 - 8.1 g/dL Final  . Albumin 05/11/2015 2.8* 3.5 - 5.0 g/dL Final  . AST 05/11/2015 19  15 - 41 U/L Final  . ALT 05/11/2015 15* 17 - 63 U/L Final  . Alkaline Phosphatase 05/11/2015 73  38 - 126 U/L Final  . Total Bilirubin 05/11/2015 0.7  0.3 - 1.2 mg/dL Final  . GFR calc non Af Amer 05/11/2015 >60  >60 mL/min Final  . GFR calc Af Amer 05/11/2015 >60  >60 mL/min Final   Comment: (NOTE) The eGFR has been calculated using the CKD EPI equation. This calculation has not been validated in all clinical situations. eGFR's persistently <60 mL/min signify possible Chronic Kidney Disease.   . Anion gap 05/11/2015 6  5 - 15 Final  . Lipase 05/11/2015 19* 22 - 51 U/L Final  . Prothrombin Time 05/11/2015 14.0  11.6 - 15.2 seconds Final  . INR 05/11/2015 1.06  0.00 - 1.49 Final  . Color, Urine 05/11/2015 YELLOW  YELLOW  Final  . APPearance 05/11/2015 CLEAR  CLEAR Final  . Specific Gravity, Urine 05/11/2015 1.017  1.005 - 1.030 Final  . pH 05/11/2015 6.0  5.0 - 8.0 Final  . Glucose, UA 05/11/2015 NEGATIVE  NEGATIVE mg/dL Final  . Hgb urine dipstick 05/11/2015 SMALL* NEGATIVE Final  . Bilirubin Urine 05/11/2015 NEGATIVE  NEGATIVE Final  . Ketones, ur 05/11/2015 NEGATIVE  NEGATIVE mg/dL Final  . Protein, ur 05/11/2015 100* NEGATIVE mg/dL Final  . Urobilinogen, UA 05/11/2015 1.0  0.0 - 1.0 mg/dL Final  . Nitrite 05/11/2015 NEGATIVE  NEGATIVE Final  . Leukocytes, UA 05/11/2015 NEGATIVE  NEGATIVE Final  . Sodium 05/11/2015 135  135 - 145 mmol/L Final  . Potassium 05/11/2015 3.9  3.5 - 5.1 mmol/L Final  . Chloride 05/11/2015 98* 101 - 111 mmol/L Final  . BUN 05/11/2015 14  6 - 20 mg/dL Final  . Creatinine, Ser 05/11/2015 0.70  0.61 - 1.24 mg/dL Final  . Glucose, Bld 05/11/2015 202* 65 - 99 mg/dL Final  . Calcium, Ion 05/11/2015 1.13  1.13 - 1.30 mmol/L Final  . TCO2 05/11/2015 25  0 - 100 mmol/L Final  . Hemoglobin 05/11/2015 13.6  13.0 - 17.0 g/dL Final  . HCT 05/11/2015 40.0  39.0 - 52.0 % Final  . Squamous Epithelial / LPF 05/11/2015 RARE  RARE Final  . WBC, UA 05/11/2015 0-2  <3 WBC/hpf Final  . RBC / HPF 05/11/2015 0-2  <3 RBC/hpf Final  . Bacteria, UA 05/11/2015 RARE  RARE Final  Admission on 04/29/2015, Discharged on 05/06/2015  Component Date Value Ref Range Status  . Glucose-Capillary 04/29/2015 320* 65 - 99 mg/dL Final  . Comment 1 04/29/2015 Notify RN   Final  . Comment 2 04/29/2015 Document in Chart   Final  . WBC 04/29/2015 7.2  4.0 - 10.5 K/uL Final  . RBC 04/29/2015 4.89  4.22 - 5.81 MIL/uL Final  . Hemoglobin 04/29/2015 14.6  13.0 - 17.0 g/dL Final  . HCT 04/29/2015 42.6  39.0 - 52.0 % Final  . MCV 04/29/2015 87.1  78.0 - 100.0 fL Final  . [facility] 04/29/2015  29.9  26.0 - 34.0 pg Final  . MCHC 04/29/2015 34.3  30.0 - 36.0 g/dL Final  . RDW 04/29/2015 13.3  11.5 - 15.5 % Final  . Platelets  04/29/2015 197  150 - 400 K/uL Final  . Neutrophils Relative % 04/29/2015 59  43 - 77 % Final  . Neutro Abs 04/29/2015 4.3  1.7 - 7.7 K/uL Final  . Lymphocytes Relative 04/29/2015 23  12 - 46 % Final  . Lymphs Abs 04/29/2015 1.6  0.7 - 4.0 K/uL Final  . Monocytes Relative 04/29/2015 7  3 - 12 % Final  . Monocytes Absolute 04/29/2015 0.5  0.1 - 1.0 K/uL Final  . Eosinophils Relative 04/29/2015 9* 0 - 5 % Final  . Eosinophils Absolute 04/29/2015 0.6  0.0 - 0.7 K/uL Final  . Basophils Relative 04/29/2015 2* 0 - 1 % Final  . Basophils Absolute 04/29/2015 0.1  0.0 - 0.1 K/uL Final  . Sodium 04/29/2015 135  135 - 145 mmol/L Final  . Potassium 04/29/2015 4.6  3.5 - 5.1 mmol/L Final  . Chloride 04/29/2015 101  101 - 111 mmol/L Final  . CO2 04/29/2015 27  22 - 32 mmol/L Final  . Glucose, Bld 04/29/2015 352* 65 - 99 mg/dL Final  . BUN 04/29/2015 12  6 - 20 mg/dL Final  . Creatinine, Ser 04/29/2015 0.72  0.61 - 1.24 mg/dL Final  . Calcium 04/29/2015 9.1  8.9 - 10.3 mg/dL Final  . Total Protein 04/29/2015 6.6  6.5 - 8.1 g/dL Final  . Albumin 04/29/2015 3.0* 3.5 - 5.0 g/dL Final  . AST 04/29/2015 19  15 - 41 U/L Final  . ALT 04/29/2015 14* 17 - 63 U/L Final  . Alkaline Phosphatase 04/29/2015 75  38 - 126 U/L Final  . Total Bilirubin 04/29/2015 0.4  0.3 - 1.2 mg/dL Final  . GFR calc non Af Amer 04/29/2015 >60  >60 mL/min Final  . GFR calc Af Amer 04/29/2015 >60  >60 mL/min Final   Comment: (NOTE) The eGFR has been calculated using the CKD EPI equation. This calculation has not been validated in all clinical situations. eGFR's persistently <60 mL/min signify possible Chronic Kidney Disease.   . Anion gap 04/29/2015 7  5 - 15 Final  . Troponin i, poc 04/29/2015 0.00  0.00 - 0.08 ng/mL Final  . Comment 3 04/29/2015          Final   Comment: Due to the release kinetics of cTnI, a negative result within the first hours of the onset of symptoms does not rule out myocardial infarction with  certainty. If myocardial infarction is still suspected, repeat the test at appropriate intervals.   Marland Kitchen MRSA by PCR 04/30/2015 POSITIVE* NEGATIVE Final   Comment:        The GeneXpert MRSA Assay (FDA approved for NASAL specimens only), is one component of a comprehensive MRSA colonization surveillance program. It is not intended to diagnose MRSA infection nor to guide or monitor treatment for MRSA infections. RESULT CALLED TO, READ BACK BY AND VERIFIED WITH: Derrell Lolling RN [phone] [id number] GREEN R   . Troponin I 04/30/2015 <0.03  <0.031 ng/mL Final   Comment:        NO INDICATION OF MYOCARDIAL INJURY.   . Troponin I 04/30/2015 <0.03  <0.031 ng/mL Final   Comment:        NO INDICATION OF MYOCARDIAL INJURY.   . Troponin I 04/30/2015 <0.03  <0.031 ng/mL Final   Comment:  NO INDICATION OF MYOCARDIAL INJURY.   . WBC 04/30/2015 7.2  4.0 - 10.5 K/uL Final  . RBC 04/30/2015 4.85  4.22 - 5.81 MIL/uL Final  . Hemoglobin 04/30/2015 14.3  13.0 - 17.0 g/dL Final  . HCT 04/30/2015 42.6  39.0 - 52.0 % Final  . MCV 04/30/2015 87.8  78.0 - 100.0 fL Final  . MCH 04/30/2015 29.5  26.0 - 34.0 pg Final  . MCHC 04/30/2015 33.6  30.0 - 36.0 g/dL Final  . RDW 04/30/2015 13.5  11.5 - 15.5 % Final  . Platelets 04/30/2015 189  150 - 400 K/uL Final  . Creatinine, Ser 04/30/2015 0.75  0.61 - 1.24 mg/dL Final  . GFR calc non Af Amer 04/30/2015 >60  >60 mL/min Final  . GFR calc Af Amer 04/30/2015 >60  >60 mL/min Final   Comment: (NOTE) The eGFR has been calculated using the CKD EPI equation. This calculation has not been validated in all clinical situations. eGFR's persistently <60 mL/min signify possible Chronic Kidney Disease.   . Hgb A1c MFr Bld 04/30/2015 9.3* 4.8 - 5.6 % Final   Comment: (NOTE)         Pre-diabetes: 5.7 - 6.4         Diabetes: >6.4         Glycemic control for adults with diabetes: <7.0   . Mean Plasma Glucose 04/30/2015 220   Final   Comment: (NOTE) Performed At: [facility] [place], [place] [id number] Lindon Romp MD [id number]   . Glucose-Capillary 04/30/2015 251* 65 - 99 mg/dL Final  . Comment 1 04/30/2015 Notify RN   Final  . Cholesterol 04/30/2015 163  0 - 200 mg/dL Final  . Triglycerides 04/30/2015 133  <150 mg/dL Final  . HDL 04/30/2015 30* >40 mg/dL Final  . Total CHOL/HDL Ratio 04/30/2015 5.4   Final  . VLDL 04/30/2015 27  0 - 40 mg/dL Final  . LDL Cholesterol 04/30/2015 106* 0 - 99 mg/dL Final   Comment:        Total Cholesterol/HDL:CHD Risk Coronary Heart Disease Risk Table                     Men   Women  1/2 Average Risk   3.4   3.3  Average Risk       5.0   4.4  2 X Average Risk   9.6   7.1  3 X Average Risk  23.4   11.0        Use the calculated Patient Ratio above and the CHD Risk Table to determine the patient's CHD Risk.        ATP III CLASSIFICATION (LDL):  <100     mg/dL   Optimal  100-129  mg/dL   Near or Above                    Optimal  130-159  mg/dL   Borderline  160-189  mg/dL   High  >190     mg/dL   Very High   . Glucose-Capillary 04/30/2015 248* 65 - 99 mg/dL Final  . Glucose-Capillary 04/30/2015 206* 65 - 99 mg/dL Final  . Glucose-Capillary 04/30/2015 334* 65 - 99 mg/dL Final  . Glucose-Capillary 04/30/2015 293* 65 - 99 mg/dL Final  . Glucose-Capillary 05/01/2015 127* 65 - 99 mg/dL Final  . Glucose-Capillary 05/01/2015 146* 65 - 99 mg/dL Final  . Glucose-Capillary 05/01/2015 294* 65 - 99 mg/dL Final  .  Glucose-Capillary 05/01/2015 203* 65 - 99 mg/dL Final  . Prothrombin Time 05/02/2015 14.6  11.6 - 15.2 seconds Final  . INR 05/02/2015 1.12  0.00 - 1.49 Final  . Glucose-Capillary 05/02/2015 189* 65 - 99 mg/dL Final  . Comment 1 05/02/2015 Notify RN   Final  . Comment 2 05/02/2015 Document in Chart   Final  . Glucose-Capillary 05/02/2015 137* 65 - 99 mg/dL Final  . Comment 1 05/02/2015 Notify RN   Final  . Comment 2 05/02/2015 Document in Chart   Final  . WBC 05/03/2015  7.3  4.0 - 10.5 K/uL Final  . RBC 05/03/2015 4.63  4.22 - 5.81 MIL/uL Final  . Hemoglobin 05/03/2015 13.8  13.0 - 17.0 g/dL Final  . HCT 05/03/2015 40.3  39.0 - 52.0 % Final  . MCV 05/03/2015 87.0  78.0 - 100.0 fL Final  . MCH 05/03/2015 29.8  26.0 - 34.0 pg Final  . MCHC 05/03/2015 34.2  30.0 - 36.0 g/dL Final  . RDW 05/03/2015 13.3  11.5 - 15.5 % Final  . Platelets 05/03/2015 177  150 - 400 K/uL Final  . Sodium 05/03/2015 134* 135 - 145 mmol/L Final  . Potassium 05/03/2015 3.9  3.5 - 5.1 mmol/L Final  . Chloride 05/03/2015 102  101 - 111 mmol/L Final  . CO2 05/03/2015 27  22 - 32 mmol/L Final  . Glucose, Bld 05/03/2015 165* 65 - 99 mg/dL Final  . BUN 05/03/2015 9  6 - 20 mg/dL Final  . Creatinine, Ser 05/03/2015 0.71  0.61 - 1.24 mg/dL Final  . Calcium 05/03/2015 8.3* 8.9 - 10.3 mg/dL Final  . GFR calc non Af Amer 05/03/2015 >60  >60 mL/min Final  . GFR calc Af Amer 05/03/2015 >60  >60 mL/min Final   Comment: (NOTE) The eGFR has been calculated using the CKD EPI equation. This calculation has not been validated in all clinical situations. eGFR's persistently <60 mL/min signify possible Chronic Kidney Disease.   . Anion gap 05/03/2015 5  5 - 15 Final  . Glucose-Capillary 05/02/2015 143* 65 - 99 mg/dL Final  . Comment 1 05/02/2015 Notify RN   Final  . Activated Clotting Time 05/02/2015 478   Final  . Glucose-Capillary 05/02/2015 199* 65 - 99 mg/dL Final  . Comment 1 05/02/2015 Notify RN   Final  . Troponin I 05/03/2015 0.09* <0.031 ng/mL Final   Comment:        PERSISTENTLY INCREASED TROPONIN VALUES IN THE RANGE OF 0.04-0.49 ng/mL CAN BE SEEN IN:       -UNSTABLE ANGINA       -CONGESTIVE HEART FAILURE       -MYOCARDITIS       -CHEST TRAUMA       -ARRYHTHMIAS       -LATE PRESENTING MYOCARDIAL INFARCTION       -COPD   CLINICAL FOLLOW-UP RECOMMENDED.   Marland Kitchen Troponin I 05/03/2015 0.12* <0.031 ng/mL Final   Comment:        PERSISTENTLY INCREASED TROPONIN VALUES IN THE RANGE OF  0.04-0.49 ng/mL CAN BE SEEN IN:       -UNSTABLE ANGINA       -CONGESTIVE HEART FAILURE       -MYOCARDITIS       -CHEST TRAUMA       -ARRYHTHMIAS       -LATE PRESENTING MYOCARDIAL INFARCTION       -COPD   CLINICAL FOLLOW-UP RECOMMENDED.   Marland Kitchen Troponin I 05/03/2015 0.11* <0.031 ng/mL Final  Comment:        PERSISTENTLY INCREASED TROPONIN VALUES IN THE RANGE OF 0.04-0.49 ng/mL CAN BE SEEN IN:       -UNSTABLE ANGINA       -CONGESTIVE HEART FAILURE       -MYOCARDITIS       -CHEST TRAUMA       -ARRYHTHMIAS       -LATE PRESENTING MYOCARDIAL INFARCTION       -COPD   CLINICAL FOLLOW-UP RECOMMENDED.   Marland Kitchen Glucose-Capillary 05/03/2015 125* 65 - 99 mg/dL Final  . Comment 1 05/03/2015 Capillary Specimen   Final  . Glucose-Capillary 05/03/2015 146* 65 - 99 mg/dL Final  . Glucose-Capillary 05/03/2015 106* 65 - 99 mg/dL Final  . Comment 1 05/03/2015 Capillary Specimen   Final  . Glucose-Capillary 05/03/2015 183* 65 - 99 mg/dL Final  . Comment 1 05/03/2015 Capillary Specimen   Final  . Glucose-Capillary 05/04/2015 136* 65 - 99 mg/dL Final  . Glucose-Capillary 05/04/2015 186* 65 - 99 mg/dL Final  . Comment 1 05/04/2015 Notify RN   Final  . Glucose-Capillary 05/04/2015 150* 65 - 99 mg/dL Final  . Comment 1 05/04/2015 Notify RN   Final  . Glucose-Capillary 05/05/2015 91  65 - 99 mg/dL Final  . Comment 1 05/05/2015 Capillary Specimen   Final  . Glucose-Capillary 05/05/2015 125* 65 - 99 mg/dL Final  . Glucose-Capillary 05/05/2015 151* 65 - 99 mg/dL Final  . Comment 1 05/05/2015 Notify RN   Final  . Comment 2 05/05/2015 Document in Chart   Final  . Glucose-Capillary 05/05/2015 184* 65 - 99 mg/dL Final  . Glucose-Capillary 05/06/2015 128* 65 - 99 mg/dL Final  . Glucose-Capillary 05/06/2015 170* 65 - 99 mg/dL Final  Admission on 04/14/2015, Discharged on 04/18/2015  Component Date Value Ref Range Status  . Sodium 04/14/2015 130* 135 - 145 mmol/L Final  . Potassium 04/14/2015 4.6  3.5 - 5.1  mmol/L Final  . Chloride 04/14/2015 96* 101 - 111 mmol/L Final  . CO2 04/14/2015 26  22 - 32 mmol/L Final  . Glucose, Bld 04/14/2015 358* 65 - 99 mg/dL Final  . BUN 04/14/2015 15  6 - 20 mg/dL Final  . Creatinine, Ser 04/14/2015 0.82  0.61 - 1.24 mg/dL Final  . Calcium 04/14/2015 8.8* 8.9 - 10.3 mg/dL Final  . Total Protein 04/14/2015 7.6  6.5 - 8.1 g/dL Final  . Albumin 04/14/2015 3.5  3.5 - 5.0 g/dL Final  . AST 04/14/2015 15  15 - 41 U/L Final  . ALT 04/14/2015 11* 17 - 63 U/L Final  . Alkaline Phosphatase 04/14/2015 102  38 - 126 U/L Final  . Total Bilirubin 04/14/2015 0.8  0.3 - 1.2 mg/dL Final  . GFR calc non Af Amer 04/14/2015 >60  >60 mL/min Final  . GFR calc Af Amer 04/14/2015 >60  >60 mL/min Final   Comment: (NOTE) The eGFR has been calculated using the CKD EPI equation. This calculation has not been validated in all clinical situations. eGFR's persistently <60 mL/min signify possible Chronic Kidney Disease.   . Anion gap 04/14/2015 8  5 - 15 Final  . WBC 04/14/2015 13.1* 4.0 - 10.5 K/uL Final  . RBC 04/14/2015 4.83  4.22 - 5.81 MIL/uL Final  . Hemoglobin 04/14/2015 14.5  13.0 - 17.0 g/dL Final  . HCT 04/14/2015 42.2  39.0 - 52.0 % Final  . MCV 04/14/2015 87.4  78.0 - 100.0 fL Final  . MCH 04/14/2015 30.0  26.0 - 34.0 pg Final  .  MCHC 04/14/2015 34.4  30.0 - 36.0 g/dL Final  . RDW 04/14/2015 13.0  11.5 - 15.5 % Final  . Platelets 04/14/2015 285  150 - 400 K/uL Final  . Neutrophils Relative % 04/14/2015 76  43 - 77 % Final  . Neutro Abs 04/14/2015 9.9* 1.7 - 7.7 K/uL Final  . Lymphocytes Relative 04/14/2015 12  12 - 46 % Final  . Lymphs Abs 04/14/2015 1.6  0.7 - 4.0 K/uL Final  . Monocytes Relative 04/14/2015 5  3 - 12 % Final  . Monocytes Absolute 04/14/2015 0.7  0.1 - 1.0 K/uL Final  . Eosinophils Relative 04/14/2015 6* 0 - 5 % Final  . Eosinophils Absolute 04/14/2015 0.8* 0.0 - 0.7 K/uL Final  . Basophils Relative 04/14/2015 1  0 - 1 % Final  . Basophils Absolute  04/14/2015 0.1  0.0 - 0.1 K/uL Final  . Specimen Description 04/14/2015 BLOOD RIGHT ANTECUBITAL   Final  . Special Requests 04/14/2015 BOTTLES DRAWN AEROBIC AND ANAEROBIC 5ML   Final  . Culture 04/14/2015    Final                   Value:NO GROWTH 5 DAYS Performed at [facility]   . Report Status 04/14/2015 04/19/2015 FINAL   Final  . Specimen Description 04/14/2015 BLOOD LEFT ANTECUBITAL   Final  . Special Requests 04/14/2015 BOTTLES DRAWN AEROBIC AND ANAEROBIC 4.5ML   Final  . Culture 04/14/2015    Final                   Value:NO GROWTH 5 DAYS Performed at [facility]   . Report Status 04/14/2015 04/19/2015 FINAL   Final  . Color, Urine 04/14/2015 YELLOW  YELLOW Final  . APPearance 04/14/2015 CLOUDY* CLEAR Final  . Specific Gravity, Urine 04/14/2015 1.011  1.005 - 1.030 Final  . pH 04/14/2015 5.0  5.0 - 8.0 Final  . Glucose, UA 04/14/2015 500* NEGATIVE mg/dL Final  . Hgb urine dipstick 04/14/2015 LARGE* NEGATIVE Final  . Bilirubin Urine 04/14/2015 NEGATIVE  NEGATIVE Final  . Ketones, ur 04/14/2015 NEGATIVE  NEGATIVE mg/dL Final  . Protein, ur 04/14/2015 100* NEGATIVE mg/dL Final  . Urobilinogen, UA 04/14/2015 0.2  0.0 - 1.0 mg/dL Final  . Nitrite 04/14/2015 NEGATIVE  NEGATIVE Final  . Leukocytes, UA 04/14/2015 MODERATE* NEGATIVE Final  . Specimen Description 04/14/2015 URINE, RANDOM   Final  . Special Requests 04/14/2015 NONE   Final  . Culture 04/14/2015    Final                   Value:MULTIPLE SPECIES PRESENT, SUGGEST RECOLLECTION Performed at [facility]   . Report Status 04/14/2015 04/16/2015 FINAL   Final  . Lactic Acid, Venous 04/14/2015 1.5  0.5 - 2.0 mmol/L Final  . Lactic Acid, Venous 04/14/2015 1.2  0.5 - 2.0 mmol/L Final  . WBC, UA 04/14/2015 21-50  <3 WBC/hpf Final  . RBC / HPF 04/14/2015 21-50  <3 RBC/hpf Final  . Bacteria, UA 04/14/2015 MANY* RARE Final  . Hgb A1c MFr Bld 04/14/2015 9.6* 4.8 - 5.6 % Final   Comment: (NOTE)          Pre-diabetes: 5.7 - 6.4         Diabetes: >6.4         Glycemic control for adults with diabetes: <7.0   . Mean Plasma Glucose 04/14/2015 229   Final   Comment: (NOTE) Performed At: [facility]  [place], [place] [id number] Lindon Romp MD [id number]   . Sodium 04/15/2015 135  135 - 145 mmol/L Final  . Potassium 04/15/2015 4.6  3.5 - 5.1 mmol/L Final  . Chloride 04/15/2015 101  101 - 111 mmol/L Final  . CO2 04/15/2015 28  22 - 32 mmol/L Final  . Glucose, Bld 04/15/2015 179* 65 - 99 mg/dL Final  . BUN 04/15/2015 13  6 - 20 mg/dL Final  . Creatinine, Ser 04/15/2015 0.70  0.61 - 1.24 mg/dL Final  . Calcium 04/15/2015 8.3* 8.9 - 10.3 mg/dL Final  . GFR calc non Af Amer 04/15/2015 >60  >60 mL/min Final  . GFR calc Af Amer 04/15/2015 >60  >60 mL/min Final   Comment: (NOTE) The eGFR has been calculated using the CKD EPI equation. This calculation has not been validated in all clinical situations. eGFR's persistently <60 mL/min signify possible Chronic Kidney Disease.   . Anion gap 04/15/2015 6  5 - 15 Final  . WBC 04/15/2015 9.7  4.0 - 10.5 K/uL Final  . RBC 04/15/2015 4.41  4.22 - 5.81 MIL/uL Final  . Hemoglobin 04/15/2015 13.0  13.0 - 17.0 g/dL Final  . HCT 04/15/2015 39.6  39.0 - 52.0 % Final  . MCV 04/15/2015 89.8  78.0 - 100.0 fL Final  . MCH 04/15/2015 29.5  26.0 - 34.0 pg Final  . MCHC 04/15/2015 32.8  30.0 - 36.0 g/dL Final  . RDW 04/15/2015 13.2  11.5 - 15.5 % Final  . Platelets 04/15/2015 264  150 - 400 K/uL Final  . Glucose-Capillary 04/14/2015 301* 65 - 99 mg/dL Final  . Comment 1 04/14/2015 Notify RN   Final  . Comment 2 04/14/2015 Document in Chart   Final  . MRSA by PCR 04/14/2015 POSITIVE* NEGATIVE Final   Comment:        The GeneXpert MRSA Assay (FDA approved for NASAL specimens only), is one component of a comprehensive MRSA colonization surveillance program. It is not intended to diagnose MRSA infection nor to guide or monitor  treatment for MRSA infections. RESULT CALLED TO, READ BACK BY AND VERIFIED WITH: E.MADRIAGA,RN AT 0045 04/15/15 BY L.PITT Performed at [facility]   . Glucose-Capillary 04/14/2015 208* 65 - 99 mg/dL Final  . Comment 1 04/14/2015 Notify RN   Final  . Comment 2 04/14/2015 Document in Chart   Final  . Glucose-Capillary 04/15/2015 129* 65 - 99 mg/dL Final  . Glucose-Capillary 04/15/2015 180* 65 - 99 mg/dL Final  . Glucose-Capillary 04/15/2015 146* 65 - 99 mg/dL Final  . Glucose-Capillary 04/15/2015 220* 65 - 99 mg/dL Final  . Comment 1 04/15/2015 Notify RN   Final  . Comment 2 04/15/2015 Document in Chart   Final  . Comment 3 04/15/2015 Glucose Stabilizer   Final  . Magnesium 04/16/2015 1.7  1.7 - 2.4 mg/dL Final  . Glucose-Capillary 04/16/2015 147* 65 - 99 mg/dL Final  . Comment 1 04/16/2015 Notify RN   Final  . Comment 2 04/16/2015 Document in Chart   Final  . Glucose-Capillary 04/16/2015 133* 65 - 99 mg/dL Final  . Comment 1 04/16/2015 Notify RN   Final  . Comment 2 04/16/2015 Document in Chart   Final  . Vancomycin Tr 04/16/2015 13  10.0 - 20.0 ug/mL Final  . Glucose-Capillary 04/16/2015 143* 65 - 99 mg/dL Final  . Comment 1 04/16/2015 Notify RN   Final  . Comment 2 04/16/2015 Document in Chart   Final  . Glucose-Capillary 04/16/2015  187* 65 - 99 mg/dL Final  . Comment 1 04/16/2015 Notify RN   Final  . Magnesium 04/17/2015 1.8  1.7 - 2.4 mg/dL Final  . Troponin I 04/17/2015 <0.03  <0.031 ng/mL Final   Comment:        NO INDICATION OF MYOCARDIAL INJURY.   . Troponin I 04/17/2015 <0.03  <0.031 ng/mL Final   Comment:        NO INDICATION OF MYOCARDIAL INJURY.   . Glucose-Capillary 04/17/2015 151* 65 - 99 mg/dL Final  . Glucose-Capillary 04/17/2015 180* 65 - 99 mg/dL Final  . Glucose-Capillary 04/17/2015 142* 65 - 99 mg/dL Final  . Sodium 04/18/2015 135  135 - 145 mmol/L Final  . Potassium 04/18/2015 4.1  3.5 - 5.1 mmol/L Final  . Chloride 04/18/2015 101  101 - 111  mmol/L Final  . CO2 04/18/2015 29  22 - 32 mmol/L Final  . Glucose, Bld 04/18/2015 161* 65 - 99 mg/dL Final  . BUN 04/18/2015 14  6 - 20 mg/dL Final  . Creatinine, Ser 04/18/2015 0.68  0.61 - 1.24 mg/dL Final  . Calcium 04/18/2015 8.8* 8.9 - 10.3 mg/dL Final  . GFR calc non Af Amer 04/18/2015 >60  >60 mL/min Final  . GFR calc Af Amer 04/18/2015 >60  >60 mL/min Final   Comment: (NOTE) The eGFR has been calculated using the CKD EPI equation. This calculation has not been validated in all clinical situations. eGFR's persistently <60 mL/min signify possible Chronic Kidney Disease.   . Anion gap 04/18/2015 5  5 - 15 Final  . WBC 04/18/2015 7.3  4.0 - 10.5 K/uL Final  . RBC 04/18/2015 4.53  4.22 - 5.81 MIL/uL Final  . Hemoglobin 04/18/2015 13.0  13.0 - 17.0 g/dL Final  . HCT 04/18/2015 40.1  39.0 - 52.0 % Final  . MCV 04/18/2015 88.5  78.0 - 100.0 fL Final  . MCH 04/18/2015 28.7  26.0 - 34.0 pg Final  . MCHC 04/18/2015 32.4  30.0 - 36.0 g/dL Final  . RDW 04/18/2015 12.9  11.5 - 15.5 % Final  . Platelets 04/18/2015 247  150 - 400 K/uL Final  . Glucose-Capillary 04/17/2015 173* 65 - 99 mg/dL Final  . Troponin I 04/18/2015 <0.03  <0.031 ng/mL Final   Comment:        NO INDICATION OF MYOCARDIAL INJURY.   . Glucose-Capillary 04/18/2015 199* 65 - 99 mg/dL Final  . Glucose-Capillary 04/18/2015 161* 65 - 99 mg/dL Final    Ct Abdomen Pelvis W Contrast  05/12/2015   CLINICAL DATA:  Acute onset of lower abdominal pain for 3 days. No bowel movements for 10 days. Initial encounter.  EXAM: CT ABDOMEN AND PELVIS WITH CONTRAST  TECHNIQUE: Multidetector CT imaging of the abdomen and pelvis was performed using the standard protocol following bolus administration of intravenous contrast.  CONTRAST:  144m OMNIPAQUE IOHEXOL 300 MG/ML  SOLN  COMPARISON:  CT of the abdomen and pelvis from 11/17/2014  FINDINGS: The visualized lung bases are clear. The patient is status post gastroplasty. Scattered coronary  artery calcification is noted.  The liver and spleen are unremarkable in appearance. The patient is status post cholecystectomy, with clips noted along the gallbladder fossa. The pancreas and adrenal glands are unremarkable.  Nonspecific perinephric stranding is noted bilaterally. Small bilateral renal cysts are seen. The kidneys are otherwise unremarkable. There is no evidence of hydronephrosis. No renal or ureteral stones are seen.  No free fluid is identified. The small bowel is unremarkable in appearance.  The stomach is within normal limits. No acute vascular abnormalities are seen. Scattered calcification is noted along the abdominal aorta and its branches.  The appendix is normal in caliber, without evidence of appendicitis. The colon is partially filled with stool. There is mild wall thickening along the rectum, with mild presacral stranding, concerning for mild proctitis.  The bladder is decompressed, with a Foley catheter in place. Air within the bladder likely reflects Foley catheter placement. The prostate remains normal in size. No inguinal lymphadenopathy is seen.  No acute osseous abnormalities are identified. There is grade 1 anterolisthesis of L4 on L5, with underlying vacuum phenomenon and endplate sclerotic change. There is mild chronic anterior wedging at vertebral body T11.  IMPRESSION: 1. Mild wall thickening along the rectum, with mild presacral stranding, concerning for mild proctitis. A small to moderate amount of stool is noted in the colon. 2. Scattered coronary artery calcifications seen. 3. Small bilateral renal cysts noted. 4. Scattered calcification along the abdominal aorta and its branches.   Electronically Signed   By: Garald Balding M.D.   On: 05/12/2015 00:37     Assessment/Plan   ICD-9-CM ICD-10-CM   1. 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 Physical deconditioning  799.3 R53.81   3. Chronic diastolic CHF (congestive heart failure) - compensated 428.32 I50.32    428.0    4. Neurogenic bladder with chronic indwelling catheter 596.54 N31.9   5. Type II diabetes mellitus with peripheral circulatory disorder, uncontrolled 250.72 E11.59    443.81    6. Essential hypertension - stable 401.9 I10   7. Gastroesophageal reflux disease, esophagitis presence not specified - stable 530.81 K21.9   8. OSA (obstructive sleep apnea) - refuses CPAP 327.23 G47.33    --d/c CPAP while at SNF as pt refuses to wear it  Patient is being discharged with home health services:   RN (medication mx), PT (physical deconditioning)  Patient is being discharged with the following durable medical equipment:  None  Patient has been advised to f/u with their PCP in 1-2 weeks to bring them up to date on their rehab stay.  They were provided with a 30 day supply of scripts for prescription medications and refills must be obtained from their PCP.  TIME SPENT (MINUTES): [place]. Perlie Gold  [facility] and [facility] [address] [place], [place] [address] [phone] Cell (Monday-Friday 8 AM - 5 PM) [phone] After 5 PM and follow prompts

## 2015-06-07 NOTE — Patient Outreach (Signed)
[facility]) Care Management  06/07/2015  [name] [birth year] [id number]   Notification from [name], LCSW to close case as patient will be residing at [facility] long term.  Thanks, Ronnell Freshwater. [place], [place] Assistant Phone: [phone] Fax: [phone]

## 2015-06-10 DIAGNOSIS — I5032 Chronic diastolic (congestive) heart failure: Secondary | ICD-10-CM | POA: Diagnosis not present

## 2015-06-10 DIAGNOSIS — L989 Disorder of the skin and subcutaneous tissue, unspecified: Secondary | ICD-10-CM | POA: Diagnosis not present

## 2015-06-10 DIAGNOSIS — I25119 Atherosclerotic heart disease of native coronary artery with unspecified angina pectoris: Secondary | ICD-10-CM | POA: Diagnosis not present

## 2015-06-10 DIAGNOSIS — I872 Venous insufficiency (chronic) (peripheral): Secondary | ICD-10-CM | POA: Diagnosis not present

## 2015-06-10 DIAGNOSIS — N319 Neuromuscular dysfunction of bladder, unspecified: Secondary | ICD-10-CM | POA: Diagnosis not present

## 2015-06-10 DIAGNOSIS — E1142 Type 2 diabetes mellitus with diabetic polyneuropathy: Secondary | ICD-10-CM | POA: Diagnosis not present

## 2015-06-14 ENCOUNTER — Other Ambulatory Visit: Payer: Self-pay | Admitting: Cardiology

## 2015-06-15 ENCOUNTER — Telehealth: Payer: Self-pay | Admitting: Cardiology

## 2015-06-15 DIAGNOSIS — I872 Venous insufficiency (chronic) (peripheral): Secondary | ICD-10-CM | POA: Diagnosis not present

## 2015-06-15 DIAGNOSIS — R0789 Other chest pain: Secondary | ICD-10-CM | POA: Diagnosis not present

## 2015-06-15 DIAGNOSIS — L989 Disorder of the skin and subcutaneous tissue, unspecified: Secondary | ICD-10-CM | POA: Diagnosis not present

## 2015-06-15 DIAGNOSIS — N319 Neuromuscular dysfunction of bladder, unspecified: Secondary | ICD-10-CM | POA: Diagnosis not present

## 2015-06-15 DIAGNOSIS — E1142 Type 2 diabetes mellitus with diabetic polyneuropathy: Secondary | ICD-10-CM | POA: Diagnosis not present

## 2015-06-15 DIAGNOSIS — I5032 Chronic diastolic (congestive) heart failure: Secondary | ICD-10-CM | POA: Diagnosis not present

## 2015-06-15 DIAGNOSIS — I25119 Atherosclerotic heart disease of native coronary artery with unspecified angina pectoris: Secondary | ICD-10-CM | POA: Diagnosis not present

## 2015-06-15 NOTE — Telephone Encounter (Signed)
New Message  Nurse with [facility] called in. States that the pt was c/o of Chest Pain:  1. Are you having CP right now? Not sure if he is having pains right now 2. Are you experiencing any other symptoms (ex. SOB, nausea, vomiting, sweating)? No  3. How long have you been experiencing CP? Per nurse, pt states that is started about 9:10am this morning and he states that it was easing off when she left.  4. Is your CP continuous or coming and going? Per nurse it is continuous and it would get worse from time to time. 5. Have you taken Nitroglycerin? No nitro's but he took Asprin, per nurse pt states that helped.   Comments: Home health nurse with [facility] states that the pt was holding his chest when she walked in. Pt states that he was having chest pains for about 1 hour before she came.. She called EMS and the pt refused to go to the hospital. EMS showed that it wasn't really anything. Please call back to discuss further

## 2015-06-15 NOTE — Telephone Encounter (Signed)
Called and and spoke to patient.  Patient was hospitalized then discharged to SNF for rehab then discharged home on 9/27. He was given scripts for meds for 30 day supplies.   They have not been taken to the pharmacy yet.  Pt reports the CP he experienced this am is typical of what he has been experiencing and usually takes nitro for and gets relief.  He does not have any nitro at home currently.  He also has not had his isosorbide since he left the facility. (still needs to drop off script).  EMS was called this am, they gave him baby asa, no nitro since he refused to be transported to hospital. I advised him that if his CP returns he needs to go to hospital for eval. Also advised him to pick up his medications ASAP.  He states it will probably be the weekend before his friend can do it for him.  Patient has not had a post hospital follow up appointment because he was at the rehab.  Last seen by Dr. Marlou Porch in 2015,  He did not want to make an appointment today.  He states he's seeing primary care on Monday and will see what that Dr. Has to say.  Pt is aware I am forwarding to Dr. Marlou Porch for review.

## 2015-06-17 DIAGNOSIS — E1142 Type 2 diabetes mellitus with diabetic polyneuropathy: Secondary | ICD-10-CM | POA: Diagnosis not present

## 2015-06-17 DIAGNOSIS — I872 Venous insufficiency (chronic) (peripheral): Secondary | ICD-10-CM | POA: Diagnosis not present

## 2015-06-17 DIAGNOSIS — N319 Neuromuscular dysfunction of bladder, unspecified: Secondary | ICD-10-CM | POA: Diagnosis not present

## 2015-06-17 DIAGNOSIS — L989 Disorder of the skin and subcutaneous tissue, unspecified: Secondary | ICD-10-CM | POA: Diagnosis not present

## 2015-06-17 DIAGNOSIS — I25119 Atherosclerotic heart disease of native coronary artery with unspecified angina pectoris: Secondary | ICD-10-CM | POA: Diagnosis not present

## 2015-06-17 DIAGNOSIS — I5032 Chronic diastolic (congestive) heart failure: Secondary | ICD-10-CM | POA: Diagnosis not present

## 2015-06-20 NOTE — Telephone Encounter (Signed)
Agree with plan.  Brian Furbish, MD

## 2015-06-21 DIAGNOSIS — I5032 Chronic diastolic (congestive) heart failure: Secondary | ICD-10-CM | POA: Diagnosis not present

## 2015-06-21 DIAGNOSIS — L989 Disorder of the skin and subcutaneous tissue, unspecified: Secondary | ICD-10-CM | POA: Diagnosis not present

## 2015-06-21 DIAGNOSIS — I25119 Atherosclerotic heart disease of native coronary artery with unspecified angina pectoris: Secondary | ICD-10-CM | POA: Diagnosis not present

## 2015-06-21 DIAGNOSIS — E1142 Type 2 diabetes mellitus with diabetic polyneuropathy: Secondary | ICD-10-CM | POA: Diagnosis not present

## 2015-06-21 DIAGNOSIS — N319 Neuromuscular dysfunction of bladder, unspecified: Secondary | ICD-10-CM | POA: Diagnosis not present

## 2015-06-21 DIAGNOSIS — I872 Venous insufficiency (chronic) (peripheral): Secondary | ICD-10-CM | POA: Diagnosis not present

## 2015-06-22 DIAGNOSIS — L989 Disorder of the skin and subcutaneous tissue, unspecified: Secondary | ICD-10-CM | POA: Diagnosis not present

## 2015-06-22 DIAGNOSIS — I25119 Atherosclerotic heart disease of native coronary artery with unspecified angina pectoris: Secondary | ICD-10-CM | POA: Diagnosis not present

## 2015-06-22 DIAGNOSIS — I5032 Chronic diastolic (congestive) heart failure: Secondary | ICD-10-CM | POA: Diagnosis not present

## 2015-06-22 DIAGNOSIS — I872 Venous insufficiency (chronic) (peripheral): Secondary | ICD-10-CM | POA: Diagnosis not present

## 2015-06-22 DIAGNOSIS — E1142 Type 2 diabetes mellitus with diabetic polyneuropathy: Secondary | ICD-10-CM | POA: Diagnosis not present

## 2015-06-22 DIAGNOSIS — N319 Neuromuscular dysfunction of bladder, unspecified: Secondary | ICD-10-CM | POA: Diagnosis not present

## 2015-06-23 DIAGNOSIS — I25119 Atherosclerotic heart disease of native coronary artery with unspecified angina pectoris: Secondary | ICD-10-CM | POA: Diagnosis not present

## 2015-06-23 DIAGNOSIS — E1142 Type 2 diabetes mellitus with diabetic polyneuropathy: Secondary | ICD-10-CM | POA: Diagnosis not present

## 2015-06-23 DIAGNOSIS — I872 Venous insufficiency (chronic) (peripheral): Secondary | ICD-10-CM | POA: Diagnosis not present

## 2015-06-23 DIAGNOSIS — I5032 Chronic diastolic (congestive) heart failure: Secondary | ICD-10-CM | POA: Diagnosis not present

## 2015-06-23 DIAGNOSIS — N319 Neuromuscular dysfunction of bladder, unspecified: Secondary | ICD-10-CM | POA: Diagnosis not present

## 2015-06-23 DIAGNOSIS — L989 Disorder of the skin and subcutaneous tissue, unspecified: Secondary | ICD-10-CM | POA: Diagnosis not present

## 2015-06-24 DIAGNOSIS — I872 Venous insufficiency (chronic) (peripheral): Secondary | ICD-10-CM | POA: Diagnosis not present

## 2015-06-24 DIAGNOSIS — I25119 Atherosclerotic heart disease of native coronary artery with unspecified angina pectoris: Secondary | ICD-10-CM | POA: Diagnosis not present

## 2015-06-24 DIAGNOSIS — L989 Disorder of the skin and subcutaneous tissue, unspecified: Secondary | ICD-10-CM | POA: Diagnosis not present

## 2015-06-24 DIAGNOSIS — I5032 Chronic diastolic (congestive) heart failure: Secondary | ICD-10-CM | POA: Diagnosis not present

## 2015-06-24 DIAGNOSIS — N319 Neuromuscular dysfunction of bladder, unspecified: Secondary | ICD-10-CM | POA: Diagnosis not present

## 2015-06-24 DIAGNOSIS — E1142 Type 2 diabetes mellitus with diabetic polyneuropathy: Secondary | ICD-10-CM | POA: Diagnosis not present

## 2015-06-28 DIAGNOSIS — I872 Venous insufficiency (chronic) (peripheral): Secondary | ICD-10-CM | POA: Diagnosis not present

## 2015-06-28 DIAGNOSIS — N319 Neuromuscular dysfunction of bladder, unspecified: Secondary | ICD-10-CM | POA: Diagnosis not present

## 2015-06-28 DIAGNOSIS — I25119 Atherosclerotic heart disease of native coronary artery with unspecified angina pectoris: Secondary | ICD-10-CM | POA: Diagnosis not present

## 2015-06-28 DIAGNOSIS — L989 Disorder of the skin and subcutaneous tissue, unspecified: Secondary | ICD-10-CM | POA: Diagnosis not present

## 2015-06-28 DIAGNOSIS — I5032 Chronic diastolic (congestive) heart failure: Secondary | ICD-10-CM | POA: Diagnosis not present

## 2015-06-28 DIAGNOSIS — E1142 Type 2 diabetes mellitus with diabetic polyneuropathy: Secondary | ICD-10-CM | POA: Diagnosis not present

## 2015-06-29 DIAGNOSIS — L989 Disorder of the skin and subcutaneous tissue, unspecified: Secondary | ICD-10-CM | POA: Diagnosis not present

## 2015-06-29 DIAGNOSIS — E1142 Type 2 diabetes mellitus with diabetic polyneuropathy: Secondary | ICD-10-CM | POA: Diagnosis not present

## 2015-06-29 DIAGNOSIS — N319 Neuromuscular dysfunction of bladder, unspecified: Secondary | ICD-10-CM | POA: Diagnosis not present

## 2015-06-29 DIAGNOSIS — I25119 Atherosclerotic heart disease of native coronary artery with unspecified angina pectoris: Secondary | ICD-10-CM | POA: Diagnosis not present

## 2015-06-29 DIAGNOSIS — I872 Venous insufficiency (chronic) (peripheral): Secondary | ICD-10-CM | POA: Diagnosis not present

## 2015-06-29 DIAGNOSIS — I5032 Chronic diastolic (congestive) heart failure: Secondary | ICD-10-CM | POA: Diagnosis not present

## 2015-06-30 DIAGNOSIS — L989 Disorder of the skin and subcutaneous tissue, unspecified: Secondary | ICD-10-CM | POA: Diagnosis not present

## 2015-06-30 DIAGNOSIS — I872 Venous insufficiency (chronic) (peripheral): Secondary | ICD-10-CM | POA: Diagnosis not present

## 2015-06-30 DIAGNOSIS — E1142 Type 2 diabetes mellitus with diabetic polyneuropathy: Secondary | ICD-10-CM | POA: Diagnosis not present

## 2015-06-30 DIAGNOSIS — N319 Neuromuscular dysfunction of bladder, unspecified: Secondary | ICD-10-CM | POA: Diagnosis not present

## 2015-06-30 DIAGNOSIS — I25119 Atherosclerotic heart disease of native coronary artery with unspecified angina pectoris: Secondary | ICD-10-CM | POA: Diagnosis not present

## 2015-06-30 DIAGNOSIS — I5032 Chronic diastolic (congestive) heart failure: Secondary | ICD-10-CM | POA: Diagnosis not present

## 2015-07-01 ENCOUNTER — Observation Stay (HOSPITAL_COMMUNITY): Payer: Medicare Other

## 2015-07-01 ENCOUNTER — Emergency Department (HOSPITAL_COMMUNITY): Payer: Medicare Other

## 2015-07-01 ENCOUNTER — Encounter (HOSPITAL_COMMUNITY): Payer: Self-pay | Admitting: Emergency Medicine

## 2015-07-01 ENCOUNTER — Inpatient Hospital Stay (HOSPITAL_COMMUNITY)
Admission: EM | Admit: 2015-07-01 | Discharge: 2015-07-06 | DRG: 699 | Disposition: A | Discharge status: Skilled Nursing Facility | Payer: Medicare Other | Attending: Internal Medicine | Admitting: Internal Medicine

## 2015-07-01 DIAGNOSIS — M545 Low back pain: Secondary | ICD-10-CM | POA: Diagnosis present

## 2015-07-01 DIAGNOSIS — S8992XA Unspecified injury of left lower leg, initial encounter: Secondary | ICD-10-CM | POA: Diagnosis not present

## 2015-07-01 DIAGNOSIS — I110 Hypertensive heart disease with heart failure: Secondary | ICD-10-CM | POA: Diagnosis not present

## 2015-07-01 DIAGNOSIS — E1142 Type 2 diabetes mellitus with diabetic polyneuropathy: Secondary | ICD-10-CM | POA: Diagnosis not present

## 2015-07-01 DIAGNOSIS — Z833 Family history of diabetes mellitus: Secondary | ICD-10-CM

## 2015-07-01 DIAGNOSIS — W19XXXA Unspecified fall, initial encounter: Secondary | ICD-10-CM

## 2015-07-01 DIAGNOSIS — Z8701 Personal history of pneumonia (recurrent): Secondary | ICD-10-CM

## 2015-07-01 DIAGNOSIS — I4510 Unspecified right bundle-branch block: Secondary | ICD-10-CM | POA: Diagnosis present

## 2015-07-01 DIAGNOSIS — R531 Weakness: Secondary | ICD-10-CM

## 2015-07-01 DIAGNOSIS — Z7982 Long term (current) use of aspirin: Secondary | ICD-10-CM

## 2015-07-01 DIAGNOSIS — Z6841 Body Mass Index (BMI) 40.0 and over, adult: Secondary | ICD-10-CM

## 2015-07-01 DIAGNOSIS — T83511A Infection and inflammatory reaction due to indwelling urethral catheter, initial encounter: Secondary | ICD-10-CM

## 2015-07-01 DIAGNOSIS — I5032 Chronic diastolic (congestive) heart failure: Secondary | ICD-10-CM

## 2015-07-01 DIAGNOSIS — M25562 Pain in left knee: Secondary | ICD-10-CM | POA: Diagnosis present

## 2015-07-01 DIAGNOSIS — G4710 Hypersomnia, unspecified: Secondary | ICD-10-CM | POA: Diagnosis present

## 2015-07-01 DIAGNOSIS — Z8043 Family history of malignant neoplasm of testis: Secondary | ICD-10-CM

## 2015-07-01 DIAGNOSIS — I428 Other cardiomyopathies: Secondary | ICD-10-CM | POA: Diagnosis not present

## 2015-07-01 DIAGNOSIS — B965 Pseudomonas (aeruginosa) (mallei) (pseudomallei) as the cause of diseases classified elsewhere: Secondary | ICD-10-CM | POA: Diagnosis present

## 2015-07-01 DIAGNOSIS — N390 Urinary tract infection, site not specified: Secondary | ICD-10-CM | POA: Diagnosis not present

## 2015-07-01 DIAGNOSIS — E785 Hyperlipidemia, unspecified: Secondary | ICD-10-CM | POA: Diagnosis present

## 2015-07-01 DIAGNOSIS — E1140 Type 2 diabetes mellitus with diabetic neuropathy, unspecified: Secondary | ICD-10-CM | POA: Diagnosis present

## 2015-07-01 DIAGNOSIS — Z806 Family history of leukemia: Secondary | ICD-10-CM

## 2015-07-01 DIAGNOSIS — S0990XA Unspecified injury of head, initial encounter: Secondary | ICD-10-CM | POA: Diagnosis not present

## 2015-07-01 DIAGNOSIS — I350 Nonrheumatic aortic (valve) stenosis: Secondary | ICD-10-CM | POA: Diagnosis present

## 2015-07-01 DIAGNOSIS — E1151 Type 2 diabetes mellitus with diabetic peripheral angiopathy without gangrene: Secondary | ICD-10-CM

## 2015-07-01 DIAGNOSIS — I240 Acute coronary thrombosis not resulting in myocardial infarction: Secondary | ICD-10-CM | POA: Diagnosis not present

## 2015-07-01 DIAGNOSIS — R52 Pain, unspecified: Secondary | ICD-10-CM

## 2015-07-01 DIAGNOSIS — IMO0002 Reserved for concepts with insufficient information to code with codable children: Secondary | ICD-10-CM

## 2015-07-01 DIAGNOSIS — T83518A Infection and inflammatory reaction due to other urinary catheter, initial encounter: Secondary | ICD-10-CM | POA: Diagnosis not present

## 2015-07-01 DIAGNOSIS — N319 Neuromuscular dysfunction of bladder, unspecified: Secondary | ICD-10-CM | POA: Diagnosis not present

## 2015-07-01 DIAGNOSIS — Z794 Long term (current) use of insulin: Secondary | ICD-10-CM

## 2015-07-01 DIAGNOSIS — Z8249 Family history of ischemic heart disease and other diseases of the circulatory system: Secondary | ICD-10-CM

## 2015-07-01 DIAGNOSIS — I440 Atrioventricular block, first degree: Secondary | ICD-10-CM | POA: Diagnosis present

## 2015-07-01 DIAGNOSIS — Z8673 Personal history of transient ischemic attack (TIA), and cerebral infarction without residual deficits: Secondary | ICD-10-CM

## 2015-07-01 DIAGNOSIS — E1165 Type 2 diabetes mellitus with hyperglycemia: Secondary | ICD-10-CM | POA: Diagnosis present

## 2015-07-01 DIAGNOSIS — Z885 Allergy status to narcotic agent status: Secondary | ICD-10-CM

## 2015-07-01 DIAGNOSIS — G4733 Obstructive sleep apnea (adult) (pediatric): Secondary | ICD-10-CM | POA: Diagnosis present

## 2015-07-01 DIAGNOSIS — Y846 Urinary catheterization as the cause of abnormal reaction of the patient, or of later complication, without mention of misadventure at the time of the procedure: Secondary | ICD-10-CM | POA: Diagnosis present

## 2015-07-01 DIAGNOSIS — Z87891 Personal history of nicotine dependence: Secondary | ICD-10-CM

## 2015-07-01 DIAGNOSIS — I25119 Atherosclerotic heart disease of native coronary artery with unspecified angina pectoris: Secondary | ICD-10-CM | POA: Diagnosis not present

## 2015-07-01 DIAGNOSIS — I2510 Atherosclerotic heart disease of native coronary artery without angina pectoris: Secondary | ICD-10-CM | POA: Diagnosis present

## 2015-07-01 DIAGNOSIS — R079 Chest pain, unspecified: Secondary | ICD-10-CM | POA: Diagnosis present

## 2015-07-01 DIAGNOSIS — I872 Venous insufficiency (chronic) (peripheral): Secondary | ICD-10-CM | POA: Diagnosis not present

## 2015-07-01 DIAGNOSIS — Z881 Allergy status to other antibiotic agents status: Secondary | ICD-10-CM

## 2015-07-01 DIAGNOSIS — B961 Klebsiella pneumoniae [K. pneumoniae] as the cause of diseases classified elsewhere: Secondary | ICD-10-CM | POA: Diagnosis present

## 2015-07-01 DIAGNOSIS — Z888 Allergy status to other drugs, medicaments and biological substances status: Secondary | ICD-10-CM

## 2015-07-01 DIAGNOSIS — W1830XA Fall on same level, unspecified, initial encounter: Secondary | ICD-10-CM | POA: Diagnosis present

## 2015-07-01 DIAGNOSIS — E1143 Type 2 diabetes mellitus with diabetic autonomic (poly)neuropathy: Secondary | ICD-10-CM | POA: Diagnosis present

## 2015-07-01 DIAGNOSIS — L989 Disorder of the skin and subcutaneous tissue, unspecified: Secondary | ICD-10-CM | POA: Diagnosis not present

## 2015-07-01 DIAGNOSIS — K219 Gastro-esophageal reflux disease without esophagitis: Secondary | ICD-10-CM | POA: Diagnosis present

## 2015-07-01 DIAGNOSIS — Z79899 Other long term (current) drug therapy: Secondary | ICD-10-CM

## 2015-07-01 DIAGNOSIS — G8929 Other chronic pain: Secondary | ICD-10-CM | POA: Diagnosis present

## 2015-07-01 LAB — CBC WITH DIFFERENTIAL/PLATELET
BASOS ABS: 0.1 10*3/uL (ref 0.0–0.1)
BASOS PCT: 1 %
EOS ABS: 0.5 10*3/uL (ref 0.0–0.7)
EOS PCT: 6 %
HEMATOCRIT: 38.1 % — AB (ref 39.0–52.0)
Hemoglobin: 12.8 g/dL — ABNORMAL LOW (ref 13.0–17.0)
Lymphocytes Relative: 16 %
Lymphs Abs: 1.4 10*3/uL (ref 0.7–4.0)
MCH: 29.6 pg (ref 26.0–34.0)
MCHC: 33.6 g/dL (ref 30.0–36.0)
MCV: 88.2 fL (ref 78.0–100.0)
MONO ABS: 0.6 10*3/uL (ref 0.1–1.0)
Monocytes Relative: 7 %
Neutro Abs: 6 10*3/uL (ref 1.7–7.7)
Neutrophils Relative %: 70 %
Platelets: 192 10*3/uL (ref 150–400)
RBC: 4.32 MIL/uL (ref 4.22–5.81)
RDW: 14.3 % (ref 11.5–15.5)
WBC: 8.6 10*3/uL (ref 4.0–10.5)

## 2015-07-01 LAB — URINALYSIS, ROUTINE W REFLEX MICROSCOPIC
BILIRUBIN URINE: NEGATIVE
Glucose, UA: 500 mg/dL — AB
KETONES UR: NEGATIVE mg/dL
NITRITE: POSITIVE — AB
PROTEIN: 100 mg/dL — AB
SPECIFIC GRAVITY, URINE: 1.016 (ref 1.005–1.030)
UROBILINOGEN UA: 1 mg/dL (ref 0.0–1.0)
pH: 8 (ref 5.0–8.0)

## 2015-07-01 LAB — URINE MICROSCOPIC-ADD ON

## 2015-07-01 LAB — BASIC METABOLIC PANEL
ANION GAP: 6 (ref 5–15)
BUN: 8 mg/dL (ref 6–20)
CALCIUM: 8.4 mg/dL — AB (ref 8.9–10.3)
CO2: 27 mmol/L (ref 22–32)
Chloride: 102 mmol/L (ref 101–111)
Creatinine, Ser: 0.81 mg/dL (ref 0.61–1.24)
Glucose, Bld: 298 mg/dL — ABNORMAL HIGH (ref 65–99)
Potassium: 3.9 mmol/L (ref 3.5–5.1)
Sodium: 135 mmol/L (ref 135–145)

## 2015-07-01 LAB — CBC
HEMATOCRIT: 36 % — AB (ref 39.0–52.0)
Hemoglobin: 11.9 g/dL — ABNORMAL LOW (ref 13.0–17.0)
MCH: 29.2 pg (ref 26.0–34.0)
MCHC: 33.1 g/dL (ref 30.0–36.0)
MCV: 88.5 fL (ref 78.0–100.0)
PLATELETS: 181 10*3/uL (ref 150–400)
RBC: 4.07 MIL/uL — AB (ref 4.22–5.81)
RDW: 14.4 % (ref 11.5–15.5)
WBC: 9.1 10*3/uL (ref 4.0–10.5)

## 2015-07-01 LAB — GLUCOSE, CAPILLARY
GLUCOSE-CAPILLARY: 236 mg/dL — AB (ref 65–99)
Glucose-Capillary: 257 mg/dL — ABNORMAL HIGH (ref 65–99)

## 2015-07-01 LAB — TROPONIN I
Troponin I: <0.03 ng/mL (ref <0.031)
Troponin I: <0.03 ng/mL (ref <0.031)

## 2015-07-01 MED ORDER — ENOXAPARIN SODIUM 80 MG/0.8ML ~~LOC~~ SOLN
70.0000 mg | SUBCUTANEOUS | Status: DC
  Administered 2015-07-01 – 2015-07-02 (×2): 70 mg via SUBCUTANEOUS
  Filled 2015-07-01 (×2): qty 0.8

## 2015-07-01 MED ORDER — FUROSEMIDE 40 MG PO TABS
40.0000 mg | ORAL_TABLET | Freq: Every day | ORAL | Status: DC
  Administered 2015-07-02: 40 mg via ORAL
  Filled 2015-07-01: qty 1

## 2015-07-01 MED ORDER — EZETIMIBE 10 MG PO TABS
10.0000 mg | ORAL_TABLET | Freq: Every day | ORAL | Status: DC
Start: 2015-07-02 — End: 2015-07-06
  Administered 2015-07-02 – 2015-07-06 (×5): 10 mg via ORAL
  Filled 2015-07-01 (×5): qty 1

## 2015-07-01 MED ORDER — INSULIN NPH (HUMAN) (ISOPHANE) 100 UNIT/ML ~~LOC~~ SUSP
15.0000 [IU] | Freq: Two times a day (BID) | SUBCUTANEOUS | Status: DC
  Administered 2015-07-01 – 2015-07-03 (×4): 15 [IU] via SUBCUTANEOUS
  Filled 2015-07-01: qty 10

## 2015-07-01 MED ORDER — ACETAMINOPHEN 650 MG RE SUPP: 650.0000 mg | Freq: Four times a day (QID) | RECTAL | Status: DC | PRN

## 2015-07-01 MED ORDER — ISOSORBIDE MONONITRATE ER 30 MG PO TB24
30.0000 mg | ORAL_TABLET | Freq: Every day | ORAL | Status: DC
  Administered 2015-07-02 – 2015-07-03 (×2): 30 mg via ORAL
  Filled 2015-07-01 (×2): qty 1

## 2015-07-01 MED ORDER — SENNOSIDES-DOCUSATE SODIUM 8.6-50 MG PO TABS: 2.0000 | ORAL_TABLET | Freq: Two times a day (BID) | ORAL | Status: DC | PRN

## 2015-07-01 MED ORDER — PANTOPRAZOLE SODIUM 40 MG PO TBEC
40.0000 mg | DELAYED_RELEASE_TABLET | Freq: Every day | ORAL | Status: DC
  Administered 2015-07-02 – 2015-07-06 (×5): 40 mg via ORAL
  Filled 2015-07-01 (×5): qty 1

## 2015-07-01 MED ORDER — NITROGLYCERIN 0.4 MG SL SUBL
0.4000 mg | SUBLINGUAL_TABLET | SUBLINGUAL | Status: DC | PRN
  Administered 2015-07-02 – 2015-07-05 (×5): 0.4 mg via SUBLINGUAL
  Filled 2015-07-01 (×2): qty 1

## 2015-07-01 MED ORDER — ONDANSETRON HCL 4 MG/2ML IJ SOLN: 4.0000 mg | Freq: Four times a day (QID) | INTRAMUSCULAR | Status: DC | PRN

## 2015-07-01 MED ORDER — OXYBUTYNIN CHLORIDE 5 MG PO TABS: 5.0000 mg | ORAL_TABLET | Freq: Every day | ORAL | Status: DC | PRN

## 2015-07-01 MED ORDER — SODIUM CHLORIDE 0.9 % IJ SOLN: 3.0000 mL | INTRAMUSCULAR | Status: DC | PRN

## 2015-07-01 MED ORDER — INSULIN ASPART 100 UNIT/ML ~~LOC~~ SOLN
0.0000 [IU] | Freq: Three times a day (TID) | SUBCUTANEOUS | Status: DC
  Administered 2015-07-02 (×3): 2 [IU] via SUBCUTANEOUS
  Administered 2015-07-03: 1 [IU] via SUBCUTANEOUS
  Administered 2015-07-03 (×2): 3 [IU] via SUBCUTANEOUS
  Administered 2015-07-04: 1 [IU] via SUBCUTANEOUS
  Administered 2015-07-04 – 2015-07-05 (×4): 2 [IU] via SUBCUTANEOUS
  Administered 2015-07-05: 3 [IU] via SUBCUTANEOUS
  Administered 2015-07-06: 1 [IU] via SUBCUTANEOUS
  Administered 2015-07-06: 2 [IU] via SUBCUTANEOUS

## 2015-07-01 MED ORDER — METOPROLOL TARTRATE 50 MG PO TABS
50.0000 mg | ORAL_TABLET | Freq: Two times a day (BID) | ORAL | Status: DC
  Administered 2015-07-01 – 2015-07-03 (×4): 50 mg via ORAL
  Filled 2015-07-01 (×4): qty 1

## 2015-07-01 MED ORDER — SODIUM CHLORIDE 0.9 % IV SOLN: 250.0000 mL | INTRAVENOUS | Status: DC | PRN

## 2015-07-01 MED ORDER — HYDROCODONE-ACETAMINOPHEN 5-325 MG PO TABS
1.0000 | ORAL_TABLET | Freq: Four times a day (QID) | ORAL | Status: DC | PRN
  Administered 2015-07-01 – 2015-07-06 (×10): 1 via ORAL
  Filled 2015-07-01 (×12): qty 1

## 2015-07-01 MED ORDER — ONDANSETRON HCL 4 MG PO TABS: 4.0000 mg | ORAL_TABLET | Freq: Four times a day (QID) | ORAL | Status: DC | PRN

## 2015-07-01 MED ORDER — CIPROFLOXACIN IN D5W 400 MG/200ML IV SOLN
400.0000 mg | Freq: Two times a day (BID) | INTRAVENOUS | Status: DC
  Administered 2015-07-01 – 2015-07-03 (×4): 400 mg via INTRAVENOUS
  Filled 2015-07-01 (×4): qty 200

## 2015-07-01 MED ORDER — ASPIRIN EC 81 MG PO TBEC
81.0000 mg | DELAYED_RELEASE_TABLET | Freq: Every morning | ORAL | Status: DC
  Administered 2015-07-02 – 2015-07-06 (×5): 81 mg via ORAL
  Filled 2015-07-01 (×5): qty 1

## 2015-07-01 MED ORDER — FENTANYL CITRATE (PF) 100 MCG/2ML IJ SOLN
12.5000 ug | INTRAMUSCULAR | Status: DC | PRN
  Administered 2015-07-01 – 2015-07-03 (×3): 12.5 ug via INTRAVENOUS
  Filled 2015-07-01 (×3): qty 2

## 2015-07-01 MED ORDER — FENTANYL CITRATE (PF) 100 MCG/2ML IJ SOLN: 25.0000 ug | INTRAMUSCULAR | Status: DC | PRN

## 2015-07-01 MED ORDER — ACETAMINOPHEN 325 MG PO TABS
650.0000 mg | ORAL_TABLET | Freq: Four times a day (QID) | ORAL | Status: DC | PRN
  Administered 2015-07-05: 650 mg via ORAL
  Filled 2015-07-01: qty 2

## 2015-07-01 MED ORDER — SODIUM CHLORIDE 0.9 % IJ SOLN
3.0000 mL | Freq: Two times a day (BID) | INTRAMUSCULAR | Status: DC
  Administered 2015-07-02 – 2015-07-03 (×3): 3 mL via INTRAVENOUS

## 2015-07-01 MED ORDER — SODIUM CHLORIDE 0.9 % IJ SOLN
3.0000 mL | Freq: Two times a day (BID) | INTRAMUSCULAR | Status: DC
  Administered 2015-07-02 – 2015-07-04 (×3): 3 mL via INTRAVENOUS

## 2015-07-01 NOTE — H&P (Signed)
[facility] History and Physical  [name] [id number] DOB: [birth year] DOA: 07/01/2015  Referring physician: Dr Laneta Simmers PCP: Irven Shelling, MD   Chief Complaint: weakness.   HPI: [name] is a 69 y.o. male with PMH significant for CAD, Cardiomyopathy non ischemic, HTN, diabetes, chronic foley catheter, who presents after a fall. He was standing with his walker, when he turn and his left knee give up. He hit his head with a table. He denies prior to the episode lightheaded, chest pain. He is complaining of left knee pain, unable to move left knee. He is not able to walk. He is feeling generalized weakness. Since the fall he feels is harder to focus, but think is getting better.  He has had 2 episode of chest pain over last couple of weeks.    Review of Systems:  Negative, except as per HPI  Past Medical History  Diagnosis Date  . Obese   . Hypertension   . Diabetic neuropathy, painful ([place])   . Chronic indwelling Foley catheter   . Bladder spasms   . Hyperlipidemia   . GERD (gastroesophageal reflux disease)   . Neurogenic bladder   . CAD (coronary artery disease)   . PONV (postoperative nausea and vomiting)   . CHF (congestive heart failure) ([place])   . DVT (deep venous thrombosis) (HCC) 1980's    LLE  . Type II diabetes mellitus ([place])   . Diabetic diarrhea ([place])   . Pneumonia 1999  . OSA (obstructive sleep apnea)     "they wanted me to wear a mask; I couldn't" (11/10/2014)  . Arthritis     "hands, ankles, knees" (11/10/2014)  . Chronic lower back pain   . Stroke [facility]) 10/2011; 06/2014    right hand numbness/notes 10/20/2011, pt not sure he had stroke in 2013; "right hand weaker and right face not quite right since" (11/10/2014)   Past Surgical History  Procedure Laterality Date  . Gastroplasty    . Total knee arthroplasty Right 1990's  . Left heart catheterization with coronary angiogram N/A 10/24/2011    Procedure: LEFT HEART CATHETERIZATION WITH  CORONARY ANGIOGRAM;  Surgeon: Candee Furbish, MD;  Location: [facility] CATH LAB;  Service: Cardiovascular;  Laterality: N/A;  right radial artery approach  . Cholecystectomy open  1970's?  . Joint replacement    . Carpal tunnel release Bilateral ~ 2003-2004  . Cardiac catheterization N/A 05/02/2015    Procedure: Left Heart Cath and Coronary Angiography;  Surgeon: Lorretta Harp, MD;  Location: [place] CV LAB;  Service: Cardiovascular;  Laterality: N/A;  . Cardiac catheterization N/A 05/04/2015    Procedure: Left Heart Cath and Coronary Angiography;  Surgeon: Wellington Hampshire, MD;  Location: [place] CV LAB;  Service: Cardiovascular;  Laterality: N/A;   Social History:  reports that he quit smoking about 38 years ago. His smoking use included Cigarettes. He has a 40 pack-year smoking history. He has never used smokeless tobacco. He reports that he does not drink alcohol or use illicit drugs.  Allergies  Allergen Reactions  . Ace Inhibitors Swelling    Pt tolerates lisinopril  . Lipitor [Atorvastatin Calcium] Swelling  . Metformin And Related Swelling  . Cefadroxil Hives  . Cephalexin Hives  . Morphine And Related Other (See Comments)    Sweating, feels like is "in rocky boat."  . Robaxin [Methocarbamol] Other (See Comments)    Feels like he is shaky    Family History  Problem Relation Age of Onset  .  Hypertension Mother   . Diabetes type I Father   . Cancer Brother      Testicular Cancer  . Cancer Brother     Leukemia    Prior to Admission medications   Medication Sig Start Date End Date Taking? Authorizing Provider  acetaminophen (TYLENOL) 325 MG tablet Take 2 tablets (650 mg total) by mouth every 4 (four) hours as needed for headache or mild pain. 12/08/14  Yes Erlene Quan, PA-C  aspirin EC 81 MG tablet Take 81 mg by mouth every morning.    Yes Historical Provider, MD  calcium carbonate (TUMS - DOSED IN MG ELEMENTAL CALCIUM) 500 MG chewable tablet Chew 1 tablet (200 mg of  elemental calcium total) by mouth 3 (three) times daily as needed for indigestion or heartburn. 11/19/14  Yes Debbe Odea, MD  ciprofloxacin (CIPRO) 500 MG tablet Take 1 tablet (500 mg total) by mouth every 12 (twelve) hours. 05/12/15  Yes Carmin Muskrat, MD  diphenoxylate-atropine (LOMOTIL) 2.5-0.025 MG per tablet Take 1 tablet by mouth 3 (three) times daily as needed. For diarrhea 09/06/14  Yes Tiffany L Reed, DO  ezetimibe (ZETIA) 10 MG tablet Take 1 tablet (10 mg total) by mouth daily. 05/06/15  Yes Rhonda G Barrett, PA-C  fexofenadine (ALLEGRA) 180 MG tablet Take 180 mg by mouth daily as needed for allergies or rhinitis.   Yes Historical Provider, MD  furosemide (LASIX) 40 MG tablet Take 1 tablet (40 mg total) by mouth daily. 04/03/14  Yes Lavone Orn, MD  insulin NPH Human (HUMULIN N) 100 UNIT/ML injection Inject 0.2 mLs (20 Units total) into the skin 2 (two) times daily at 8 am and 10 pm. 04/18/15  Yes Kelvin Cellar, MD  isosorbide mononitrate (IMDUR) 30 MG 24 hr tablet Take 1 tablet (30 mg total) by mouth daily. 05/06/15  Yes Rhonda G Barrett, PA-C  lisinopril (PRINIVIL,ZESTRIL) 5 MG tablet Take 5 mg by mouth daily. 06/23/15  Yes Historical Provider, MD  losartan (COZAAR) 25 MG tablet Take 1 tablet (25 mg total) by mouth daily. 05/06/15  Yes Rhonda G Barrett, PA-C  metoprolol (LOPRESSOR) 50 MG tablet Take 1 tablet (50 mg total) by mouth 2 (two) times daily. 12/08/14  Yes Luke K Kilroy, PA-C  nitroGLYCERIN (NITROSTAT) 0.4 MG SL tablet Place 1 tablet (0.4 mg total) under the tongue every 5 (five) minutes as needed for chest pain. 12/08/14  Yes Luke K Kilroy, PA-C  omeprazole (PRILOSEC) 20 MG capsule Take 20 mg by mouth daily.   Yes Historical Provider, MD  oxybutynin (DITROPAN) 5 MG tablet Take 5 mg by mouth daily as needed for bladder spasms.   Yes Historical Provider, MD  senna-docusate (SENOKOT-S) 8.6-50 MG per tablet Take 2 tablets by mouth daily. Patient taking differently: Take 2 tablets by  mouth 2 (two) times daily as needed for moderate constipation.  05/11/15  Yes Carmin Muskrat, MD  HYDROcodone-acetaminophen (NORCO/VICODIN) 5-325 MG per tablet Take 1 tablet by mouth every 6 (six) hours as needed for severe pain. Patient not taking: Reported on 07/01/2015 05/06/15   Evelene Croon Barrett, PA-C  nitrofurantoin, macrocrystal-monohydrate, (MACROBID) 100 MG capsule Take 1 capsule (100 mg total) by mouth 2 (two) times daily. Patient not taking: Reported on 07/01/2015 04/18/15   Kelvin Cellar, MD   Physical Exam: Filed Vitals:   07/01/15 1344 07/01/15 1444 07/01/15 1500 07/01/15 1600  BP: 144/63 153/62 157/73 160/69  Pulse: 68 72 74   Temp:      TempSrc:  Resp: 16 23 18 13   SpO2: 95% 94% 95%     Wt Readings from Last 3 Encounters:  06/07/15 156.491 kg (345 lb)  05/11/15 149.687 kg (330 lb)  05/10/15 149.233 kg (329 lb)    General:  Appears calm and comfortable, obese Eyes: PERRL, normal lids, irises & conjunctiva ENT: grossly normal hearing, lips & tongue Neck: no LAD, masses or thyromegaly Cardiovascular: RRR, no m/r/g. No LE edema. Telemetry: SR, no arrhythmias  Respiratory: CTA bilaterally, no w/r/r. Normal respiratory effort. Abdomen: soft, ntnd Skin:  Bilateral LE with dressing.  Musculoskeletal: grossly normal tone BUE/BLE Psychiatric: grossly normal mood and affect, speech fluent and appropriate Neurologic: grossly non-focal. Unable to move Left LE due to pain.           Labs on Admission:  Basic Metabolic Panel:  Recent Labs Lab 07/01/15 1348  NA 135  K 3.9  CL 102  CO2 27  GLUCOSE 298*  BUN 8  CREATININE 0.81  CALCIUM 8.4*   Liver Function Tests: No results for input(s): AST, ALT, ALKPHOS, BILITOT, PROT, ALBUMIN in the last 168 hours. No results for input(s): LIPASE, AMYLASE in the last 168 hours. No results for input(s): AMMONIA in the last 168 hours. CBC:  Recent Labs Lab 07/01/15 1348  WBC 8.6  NEUTROABS 6.0  HGB 12.8*  HCT 38.1*    MCV 88.2  PLT 192   Cardiac Enzymes: No results for input(s): CKTOTAL, CKMB, CKMBINDEX, TROPONINI in the last 168 hours.  BNP (last 3 results)  Recent Labs  09/14/14 0644 12/22/14 2330  BNP 95.8 120.1*    ProBNP (last 3 results)  Recent Labs  08/27/14 1548  PROBNP 1121.0*    CBG: No results for input(s): GLUCAP in the last 168 hours.  Radiological Exams on Admission: Dg Tibia/fibula Left  07/01/2015  CLINICAL DATA:  Weakness with recent fall EXAM: LEFT TIBIA AND FIBULA - 2 VIEW COMPARISON:  None. FINDINGS: Degenerative changes of the knee and ankle joints are seen. No acute fracture or dislocation is noted. No soft tissue abnormality is seen. IMPRESSION: Degenerative change without acute abnormality. Electronically Signed   By: Inez Catalina M.D.   On: 07/01/2015 13:46   Ct Head Wo Contrast  07/01/2015  CLINICAL DATA:  Recent falls with increased lethargy EXAM: CT HEAD WITHOUT CONTRAST TECHNIQUE: Contiguous axial images were obtained from the base of the skull through the vertex without intravenous contrast. COMPARISON:  04/14/2015 FINDINGS: Bony calvarium is intact. No gross soft tissue abnormality is noted. Mild atrophic changes are noted commenced with patient's given age. There are again calcifications within the basal ganglia and bowel my bilaterally. No findings to suggest acute hemorrhage, acute infarction or space-occupying mass lesion are noted. IMPRESSION: No acute abnormality noted. Electronically Signed   By: Inez Catalina M.D.   On: 07/01/2015 14:15   Dg Knee Complete 4 Views Left  07/01/2015  CLINICAL DATA:  Left knee gave out this morning.  No recent injury. EXAM: LEFT KNEE - COMPLETE 4+ VIEW COMPARISON:  None. FINDINGS: Exam demonstrates mild decreased bone mineralization. There are abundant overlying soft tissues. There are tricompartmental degenerative changes most prominent over the lateral compartment and patellofemoral joints. No evidence of fracture or  dislocation. No significant joint effusion. IMPRESSION: No acute findings. 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 Electronically Signed   By: Marin Olp M.D.   On: 07/01/2015 13:46    EKG: no EKG available.   Assessment/Plan Active Problems:  Diabetic neuropathy (HCC)   Chronic diastolic CHF (congestive heart failure) (HCC)   UTI (lower urinary tract infection)   Type II diabetes mellitus with peripheral circulatory disorder, uncontrolled ([place])   Fall from standing   Left knee pain   Weakness generalized  1- UTI;  UA with too numerous to count WBC. Will start Ciprofloxacin. Follow up urine culture.   2-Generalized weakness, suspect related to UTI. Needs PT evaluation if MRI knee  negative. check TSH, vitamin B 12 level, vitamin d.   3-Left knee Pain: complaining of pain, unable to move knee. Will order MRI left Knee.   4-CAD; had chest pain over last couple of weeks. Will check EKG, and cycle cardiac enzymes. Continue with metoprolol, aspirin, nitroglycerin.   5-Diabetes: Continue with NPH lower dose 15 units BID. SSI.   6-fall, relates confusion after fall. CT head negative. Might have mild concussion.    7-Lower extremity wounds; wound care consulted.    Code Status: Full code.  DVT Prophylaxis: Lovenox.  Family Communication: care discussed with patient  Disposition Plan: expect less than 2 days depending on test results.   Time spent: 75 minutes,   Patti Shorb, [place] [facility] Pager [phone]

## 2015-07-01 NOTE — ED Notes (Signed)
Patient transported to CT 

## 2015-07-01 NOTE — ED Notes (Signed)
Nurse drawing labs. 

## 2015-07-01 NOTE — Progress Notes (Signed)
Utilization Review completed.  Ilya Neely RN CM  

## 2015-07-01 NOTE — ED Notes (Addendum)
Pt can go to floor at 17:10. 

## 2015-07-01 NOTE — ED Notes (Addendum)
Per EMS-pt presents from home. This morning when he went to stand, left knee "gave out." Denies recent injury/surgery/trauma to knee. Also c/o increased lethargy over the past few days. Pt is diabetic and did eat today. Was seen recently for a heart catheterization-denies having an MI but did have three blocked arteries. Able to answer all questions appropriately but says, "I feel scattered today, I don't feel myself." Pt has foley catheter in for neurogenic bladder- says, "I think I'm past due for my catheter to be changed." Urine appears hazy with sediment. No other c/c. VS: 150/80 RR 17 SpO2 98% HR 81.

## 2015-07-01 NOTE — ED Provider Notes (Signed)
CSN: [id number]     Arrival date & time 07/01/15  1222 History   First MD Initiated Contact with Patient 07/01/15 1233     Chief Complaint  Patient presents with  . Knee Pain    left  . Fatigue     (Consider location/radiation/quality/duration/timing/severity/associated sxs/prior Treatment) Patient is a 69 y.o. male presenting with fall. The history is provided by the patient.  Fall This is a new problem. The current episode started 3 to 5 hours ago. The problem occurs every several days. The problem has been gradually worsening. Associated symptoms comments: Left knee pain, ongoing progressive weakness. The symptoms are aggravated by exertion and standing. Nothing relieves the symptoms. He has tried nothing for the symptoms.    Past Medical History  Diagnosis Date  . Obese   . Hypertension   . Diabetic neuropathy, painful ([place])   . Chronic indwelling Foley catheter   . Bladder spasms   . Hyperlipidemia   . GERD (gastroesophageal reflux disease)   . Neurogenic bladder   . CAD (coronary artery disease)   . PONV (postoperative nausea and vomiting)   . CHF (congestive heart failure) ([place])   . DVT (deep venous thrombosis) (HCC) 1980's    LLE  . Type II diabetes mellitus ([place])   . Diabetic diarrhea ([place])   . Pneumonia 1999  . OSA (obstructive sleep apnea)     "they wanted me to wear a mask; I couldn't" (11/10/2014)  . Arthritis     "hands, ankles, knees" (11/10/2014)  . Chronic lower back pain   . Stroke [facility]) 10/2011; 06/2014    right hand numbness/notes 10/20/2011, pt not sure he had stroke in 2013; "right hand weaker and right face not quite right since" (11/10/2014)   Past Surgical History  Procedure Laterality Date  . Gastroplasty    . Total knee arthroplasty Right 1990's  . Left heart catheterization with coronary angiogram N/A 10/24/2011    Procedure: LEFT HEART CATHETERIZATION WITH CORONARY ANGIOGRAM;  Surgeon: Candee Furbish, MD;  Location: [facility] CATH LAB;  Service:  Cardiovascular;  Laterality: N/A;  right radial artery approach  . Cholecystectomy open  1970's?  . Joint replacement    . Carpal tunnel release Bilateral ~ 2003-2004  . Cardiac catheterization N/A 05/02/2015    Procedure: Left Heart Cath and Coronary Angiography;  Surgeon: Lorretta Harp, MD;  Location: [place] CV LAB;  Service: Cardiovascular;  Laterality: N/A;  . Cardiac catheterization N/A 05/04/2015    Procedure: Left Heart Cath and Coronary Angiography;  Surgeon: Wellington Hampshire, MD;  Location: [place] CV LAB;  Service: Cardiovascular;  Laterality: N/A;   Family History  Problem Relation Age of Onset  . Hypertension Mother   . Diabetes type I Father   . Cancer Brother      Testicular Cancer  . Cancer Brother     Leukemia   Social History  Substance Use Topics  . Smoking status: Former Smoker -- 2.00 packs/day for 20 years    Types: Cigarettes    Quit date: 09/10/1976  . Smokeless tobacco: Never Used  . Alcohol Use: No     Comment: FORMER ALCOHOLIC; "sober since [id number]"    Review of Systems  Constitutional: Positive for fatigue.  All other systems reviewed and are negative.     Allergies  Ace inhibitors; Lipitor; Metformin and related; Cefadroxil; Cephalexin; Morphine and related; and Robaxin  Home Medications   Prior to Admission medications   Medication Sig Start Date End Date  Taking? Authorizing Provider  acetaminophen (TYLENOL) 325 MG tablet Take 2 tablets (650 mg total) by mouth every 4 (four) hours as needed for headache or mild pain. 12/08/14  Yes Erlene Quan, PA-C  aspirin EC 81 MG tablet Take 81 mg by mouth every morning.    Yes Historical Provider, MD  calcium carbonate (TUMS - DOSED IN MG ELEMENTAL CALCIUM) 500 MG chewable tablet Chew 1 tablet (200 mg of elemental calcium total) by mouth 3 (three) times daily as needed for indigestion or heartburn. 11/19/14  Yes Debbe Odea, MD  ciprofloxacin (CIPRO) 500 MG tablet Take 1 tablet (500 mg total)  by mouth every 12 (twelve) hours. 05/12/15  Yes Carmin Muskrat, MD  diphenoxylate-atropine (LOMOTIL) 2.5-0.025 MG per tablet Take 1 tablet by mouth 3 (three) times daily as needed. For diarrhea 09/06/14  Yes Tiffany L Reed, DO  ezetimibe (ZETIA) 10 MG tablet Take 1 tablet (10 mg total) by mouth daily. 05/06/15  Yes Rhonda G Barrett, PA-C  fexofenadine (ALLEGRA) 180 MG tablet Take 180 mg by mouth daily as needed for allergies or rhinitis.   Yes Historical Provider, MD  furosemide (LASIX) 40 MG tablet Take 1 tablet (40 mg total) by mouth daily. 04/03/14  Yes Lavone Orn, MD  insulin NPH Human (HUMULIN N) 100 UNIT/ML injection Inject 0.2 mLs (20 Units total) into the skin 2 (two) times daily at 8 am and 10 pm. 04/18/15  Yes Kelvin Cellar, MD  isosorbide mononitrate (IMDUR) 30 MG 24 hr tablet Take 1 tablet (30 mg total) by mouth daily. 05/06/15  Yes Rhonda G Barrett, PA-C  lisinopril (PRINIVIL,ZESTRIL) 5 MG tablet Take 5 mg by mouth daily. 06/23/15  Yes Historical Provider, MD  losartan (COZAAR) 25 MG tablet Take 1 tablet (25 mg total) by mouth daily. 05/06/15  Yes Rhonda G Barrett, PA-C  metoprolol (LOPRESSOR) 50 MG tablet Take 1 tablet (50 mg total) by mouth 2 (two) times daily. 12/08/14  Yes Luke K Kilroy, PA-C  nitroGLYCERIN (NITROSTAT) 0.4 MG SL tablet Place 1 tablet (0.4 mg total) under the tongue every 5 (five) minutes as needed for chest pain. 12/08/14  Yes Luke K Kilroy, PA-C  omeprazole (PRILOSEC) 20 MG capsule Take 20 mg by mouth daily.   Yes Historical Provider, MD  oxybutynin (DITROPAN) 5 MG tablet Take 5 mg by mouth daily as needed for bladder spasms.   Yes Historical Provider, MD  senna-docusate (SENOKOT-S) 8.6-50 MG per tablet Take 2 tablets by mouth daily. Patient taking differently: Take 2 tablets by mouth 2 (two) times daily as needed for moderate constipation.  05/11/15  Yes Carmin Muskrat, MD  HYDROcodone-acetaminophen (NORCO/VICODIN) 5-325 MG per tablet Take 1 tablet by mouth every 6 (six)  hours as needed for severe pain. Patient not taking: Reported on 07/01/2015 05/06/15   Evelene Croon Barrett, PA-C  nitrofurantoin, macrocrystal-monohydrate, (MACROBID) 100 MG capsule Take 1 capsule (100 mg total) by mouth 2 (two) times daily. Patient not taking: Reported on 07/01/2015 04/18/15   Kelvin Cellar, MD   BP 157/73 mmHg  Pulse 74  Temp(Src) 98.4 F (36.9 C) (Oral)  Resp 18  SpO2 95% Physical Exam  Constitutional: He is oriented to person, place, and time. He appears well-developed and well-nourished. He has a sickly appearance. No distress.  HENT:  Head: Normocephalic and atraumatic.  Eyes: Conjunctivae are normal.  Neck: Neck supple. No tracheal deviation present.  Cardiovascular: Normal rate and regular rhythm.   Pulmonary/Chest: Effort normal. No respiratory distress.  Abdominal: Soft. He exhibits  no distension.  Genitourinary:  Catheter with heavy sediment, foul odor  Musculoskeletal:       Left knee: He exhibits no swelling and no deformity. Tenderness found.       Left lower leg: He exhibits tenderness. He exhibits no deformity.  Neurological: He is alert and oriented to person, place, and time. Coordination normal. GCS eye subscore is 4. GCS verbal subscore is 5. GCS motor subscore is 6.  Skin: Skin is warm and dry.  Psychiatric: He has a normal mood and affect.    ED Course  Procedures (including critical care time) Labs Review Labs Reviewed  URINALYSIS, ROUTINE W REFLEX MICROSCOPIC (NOT AT [facility]) - Abnormal; Notable for the following:    APPearance TURBID (*)    Glucose, UA 500 (*)    Hgb urine dipstick MODERATE (*)    Protein, ur 100 (*)    Nitrite POSITIVE (*)    Leukocytes, UA LARGE (*)    All other components within normal limits  URINE MICROSCOPIC-ADD ON - Abnormal; Notable for the following:    Bacteria, UA MANY (*)    All other components within normal limits  CBC - Abnormal; Notable for the following:    Hemoglobin 12.8 (*)    HCT 38.6 (*)    All  other components within normal limits  BASIC METABOLIC PANEL - Abnormal; Notable for the following:    Glucose, Bld 216 (*)    Calcium 8.1 (*)    All other components within normal limits    Imaging Review Dg Tibia/fibula Left  07/01/2015  CLINICAL DATA:  Weakness with recent fall EXAM: LEFT TIBIA AND FIBULA - 2 VIEW COMPARISON:  None. FINDINGS: Degenerative changes of the knee and ankle joints are seen. No acute fracture or dislocation is noted. No soft tissue abnormality is seen. IMPRESSION: Degenerative change without acute abnormality. Electronically Signed   By: Inez Catalina M.D.   On: 07/01/2015 13:46   Ct Head Wo Contrast  07/01/2015  CLINICAL DATA:  Recent falls with increased lethargy EXAM: CT HEAD WITHOUT CONTRAST TECHNIQUE: Contiguous axial images were obtained from the base of the skull through the vertex without intravenous contrast. COMPARISON:  04/14/2015 FINDINGS: Bony calvarium is intact. No gross soft tissue abnormality is noted. Mild atrophic changes are noted commenced with patient's given age. There are again calcifications within the basal ganglia and bowel my bilaterally. No findings to suggest acute hemorrhage, acute infarction or space-occupying mass lesion are noted. IMPRESSION: No acute abnormality noted. Electronically Signed   By: Inez Catalina M.D.   On: 07/01/2015 14:15   Dg Knee Complete 4 Views Left  07/01/2015  CLINICAL DATA:  Left knee gave out this morning.  No recent injury. EXAM: LEFT KNEE - COMPLETE 4+ VIEW COMPARISON:  None. FINDINGS: Exam demonstrates mild decreased bone mineralization. There are abundant overlying soft tissues. There are tricompartmental degenerative changes most prominent over the lateral compartment and patellofemoral joints. No evidence of fracture or dislocation. No significant joint effusion. IMPRESSION: No acute findings. 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  Electronically Signed   By: Marin Olp M.D.   On: 07/01/2015 13:46   I have personally reviewed and evaluated these images and lab results as part of my medical decision-making.   EKG Interpretation None     MDM   Final diagnoses:  Weakness  Fall from standing, initial encounter  Catheter associated UTI    69 year old male presents from home where he lives  alone for progressively worsening weakness, trying to get out of bed and falling forward onto his left knee with worsening pain and inability to ambulate after. At baseline he can walk with a walker but today he has been unable to stand. No acute fractures on plain films. Patient states he also hit his head on the way to floor and no abnormality noted on CT. He has indwelling catheter and at this time suspect catheter associated UTI as source of ongoing weakness. Will admit for frail elderly gentleman who is not ambulatory at his baseline and will likely fail outpatient management with a lack of resources.   Leo Grosser, MD 07/02/15 [phone]

## 2015-07-01 NOTE — ED Notes (Signed)
Bed: WA20 Expected date:  Expected time:  Means of arrival:  Comments: EMS- 69yo M, fall/knee pain

## 2015-07-01 NOTE — Progress Notes (Addendum)
EDCM spoke to patient at bedside.  Patient lives at home alone. Patient presents to Ed with increased weakness post fall.  U/A positive for UTI.  Patient recently discharged from [facility] on 09/27.  Patient was discharged from SNF with RN, PT, OT aide with [facility].  Patient reports aide comes to his home two times a week to help him with his bath. Patient reports home RN assists with wound care on legs. Patient reports he is unable to stand on his left leg due to left knee pain post fall at home.  Patient has a walker at home.  Patient reports he would like a new wheelchair as the one he has at home is too big for him now.  Patient received this wheelchair from his neighbor who passed away.  Patient reports he does not have any support at home besides a friend but patient states, "I'm working on something."  [facility] provided patient with list of home health agencies in [place] highlighting [place].  EDCM also provided patient with list of private duty nursing agency lists and explained it would be an out of pocket cost.  Patient verbalized understanding.  No further EDCM needs at this time.  [facility] consult placed  07/01/2015 A. Merrick Feutz RNCN 1710pm EDCM discussed admission status with Dr. Tyrell Antonio

## 2015-07-01 NOTE — ED Notes (Signed)
Patient transported to MRI 

## 2015-07-01 NOTE — Clinical Social Work Note (Signed)
Clinical Social Work Assessment  Patient Details  Name: [name] MRN: [id number] Date of Birth: 09/22/1945  Date of referral:  07/01/15               Reason for consult:   (Fall.)                Permission sought to share information with:   (None.) Permission granted to share information::  No  Name::        Agency::     Relationship::     Contact Information:     Housing/Transportation Living arrangements for the past 2 months:  Single Family Home Source of Information:  Patient Patient Interpreter Needed:  None Criminal Activity/Legal Involvement Pertinent to Current Situation/Hospitalization:  No - Comment as needed Significant Relationships:  Friend (patient states his friend [name] is his primary support.) Lives with:  Self Do you feel safe going back to the place where you live?  Yes (Patient states that if rehab is needed he would prefer to have home health.) Need for family participation in patient care:  Yes (Comment)  Care giving concerns:  Patient informed [name] that he lives at home alone in [place]. Patient states that he receives home health with [facility]. He states that he has PT, OT, and a Nurse who come to his home x2 a week. Patient states he needs some assistance completing ADL's.    Social Worker assessment / plan:  [name] met with patient at bedside. Patient informed [name] that he presents to [facility] due to falling today. Patient states that he twisted his L knee. Patient stated " I fell and hit the end of a table".  Patient states that he has fallen x5 within the past 6 months. Patient states that he has a friend [name] who is his primary support and checks on him when he can.  Patient informed [name] that he was discharged from a nursing facility in august for PT. Patient says that he is not interested in going to a facility again.   Employment status:  Retired Insurance information:  Medicare PT Recommendations:  Not assessed at this  time Information / Referral to community resources:   ([name] provided patient with a list of SNF.)  Patient/Family's Response to care:  Patient is aware that he will be admitted. Patient is accepting at this time.  Patient/Family's Understanding of and Emotional Response to Diagnosis, Current Treatment, and Prognosis:  Patient is understanding. He state he does not have any questions for [name] at this time.  Emotional Assessment Appearance:  Appears stated age Attitude/Demeanor/Rapport:   (Accepting.) Affect (typically observed):  Accepting, Appropriate Orientation:  Oriented to Self, Oriented to Place, Oriented to  Time, Oriented to Situation Alcohol / Substance use:  Not Applicable Psych involvement (Current and /or in the community):  No (Comment)  Discharge Needs  Concerns to be addressed:  Adjustment to Illness Readmission within the last 30 days:  No Current discharge risk:  None Barriers to Discharge:  No Barriers Identified   ,  R, LCSW 07/01/2015, 7:18 PM  

## 2015-07-02 DIAGNOSIS — I5032 Chronic diastolic (congestive) heart failure: Secondary | ICD-10-CM | POA: Diagnosis not present

## 2015-07-02 DIAGNOSIS — I428 Other cardiomyopathies: Secondary | ICD-10-CM | POA: Diagnosis not present

## 2015-07-02 DIAGNOSIS — T83518A Infection and inflammatory reaction due to other urinary catheter, initial encounter: Secondary | ICD-10-CM | POA: Diagnosis not present

## 2015-07-02 DIAGNOSIS — I240 Acute coronary thrombosis not resulting in myocardial infarction: Secondary | ICD-10-CM | POA: Diagnosis not present

## 2015-07-02 DIAGNOSIS — E1151 Type 2 diabetes mellitus with diabetic peripheral angiopathy without gangrene: Secondary | ICD-10-CM | POA: Diagnosis not present

## 2015-07-02 DIAGNOSIS — M25562 Pain in left knee: Secondary | ICD-10-CM

## 2015-07-02 DIAGNOSIS — N390 Urinary tract infection, site not specified: Secondary | ICD-10-CM | POA: Diagnosis not present

## 2015-07-02 LAB — CBC
HEMATOCRIT: 38.6 % — AB (ref 39.0–52.0)
HEMOGLOBIN: 12.8 g/dL — AB (ref 13.0–17.0)
MCH: 29.8 pg (ref 26.0–34.0)
MCHC: 33.2 g/dL (ref 30.0–36.0)
MCV: 89.8 fL (ref 78.0–100.0)
Platelets: 202 10*3/uL (ref 150–400)
RBC: 4.3 MIL/uL (ref 4.22–5.81)
RDW: 14.7 % (ref 11.5–15.5)
WBC: 8.4 10*3/uL (ref 4.0–10.5)

## 2015-07-02 LAB — GLUCOSE, CAPILLARY
GLUCOSE-CAPILLARY: 198 mg/dL — AB (ref 65–99)
Glucose-Capillary: 197 mg/dL — ABNORMAL HIGH (ref 65–99)
Glucose-Capillary: 186 mg/dL — ABNORMAL HIGH (ref 65–99)
Glucose-Capillary: 243 mg/dL — ABNORMAL HIGH (ref 65–99)

## 2015-07-02 LAB — BASIC METABOLIC PANEL
ANION GAP: 5 (ref 5–15)
BUN: 8 mg/dL (ref 6–20)
CHLORIDE: 102 mmol/L (ref 101–111)
CO2: 28 mmol/L (ref 22–32)
Calcium: 8.1 mg/dL — ABNORMAL LOW (ref 8.9–10.3)
Creatinine, Ser: 0.67 mg/dL (ref 0.61–1.24)
GFR calc Af Amer: >60 mL/min (ref >60)
GFR calc non Af Amer: >60 mL/min (ref >60)
GLUCOSE: 216 mg/dL — AB (ref 65–99)
POTASSIUM: 3.6 mmol/L (ref 3.5–5.1)
Sodium: 135 mmol/L (ref 135–145)

## 2015-07-02 LAB — TROPONIN I
Troponin I: <0.03 ng/mL (ref <0.031)
Troponin I: <0.03 ng/mL (ref <0.031)

## 2015-07-02 LAB — BRAIN NATRIURETIC PEPTIDE: B Natriuretic Peptide: 320.3 pg/mL — ABNORMAL HIGH (ref 0.0–100.0)

## 2015-07-02 LAB — MRSA PCR SCREENING: MRSA BY PCR: POSITIVE — AB

## 2015-07-02 MED ORDER — ALPRAZOLAM 0.25 MG PO TABS
0.2500 mg | ORAL_TABLET | Freq: Three times a day (TID) | ORAL | Status: DC | PRN
  Administered 2015-07-02 – 2015-07-05 (×5): 0.25 mg via ORAL
  Filled 2015-07-02 (×5): qty 1

## 2015-07-02 MED ORDER — CHLORHEXIDINE GLUCONATE CLOTH 2 % EX PADS
6.0000 | MEDICATED_PAD | Freq: Every day | CUTANEOUS | Status: DC
  Administered 2015-07-03 – 2015-07-06 (×3): 6 via TOPICAL

## 2015-07-02 MED ORDER — MUPIROCIN 2 % EX OINT
1.0000 "application " | TOPICAL_OINTMENT | Freq: Two times a day (BID) | CUTANEOUS | Status: DC
  Administered 2015-07-02 – 2015-07-05 (×8): 1 via NASAL
  Filled 2015-07-02 (×3): qty 22

## 2015-07-02 MED ORDER — FUROSEMIDE 10 MG/ML IJ SOLN
20.0000 mg | Freq: Two times a day (BID) | INTRAMUSCULAR | Status: DC
  Administered 2015-07-02 – 2015-07-04 (×4): 20 mg via INTRAVENOUS
  Filled 2015-07-02 (×4): qty 2

## 2015-07-02 NOTE — Progress Notes (Signed)
PROGRESS NOTE  [name] [id number] DOB: [birth year] DOA: 07/01/2015 PCP: Irven Shelling, MD  HPI/Recap of past 54 hours: 69 year old male past medical history of morbid obesity, chronic diastolic heart failure and diabetes mellitus was standing with his walker when his left knee gave out and he fell hitting his head. He did not lose consciousness. He presented to the emergency room on 10/21 unable to walk. He's had some intermittent episodes of chest pain over the last few weeks. The emergency room, x-ray of knee was unrevealing elevated no degenerative arthritis. Reportedly patient was told many years ago by [place]oped[place] and [place] that he would need to have that knee replaced, which she has avoided. Overnight from admission, patient had some episodes of chest pain, but EKG and troponins have all been negative and no events by telemetry. Patient also on admission noted to have a significant urinary tract infection and started on antibiotics.  Today, patient feels fatigued, slightly anxious. His left knee hurts. No further chest pain  Assessment/Plan: Active Problems:    Chronic diastolic CHF (congestive heart failure) ([place]): BNP checked and found to be mildly elevated at 350. Started on gentle IV Lasix   UTI (lower urinary tract infection): Continue IV Cipro   Type II diabetes mellitus with peripheral circulatory disorder, uncontrolled ([place]) with secondary diabetic neuropathy: Continue insulin and sliding scale. A1c 2 months ago at 9   Fall from standing   Left knee pain: No evidence of acute fracture. I will get MRI secondary to patient's weight. We'll discuss with orthopedic surgery, patient does not recall who his orthopedist was before   Weakness generalized   Code Status: Full code  Family Communication: Left message with family  Disposition Plan: Awaiting physical therapy and orthopedic eval   Consultants:  Ortho    Procedures:  None  Antibiotics:  IV Rocephin 10/21-present   Objective: BP 123/55 mmHg  Pulse 74  Temp(Src) 98.2 F (36.8 C) (Oral)  Resp 18  Wt 164.021 kg (361 lb 9.6 oz)  SpO2 96%  Intake/Output Summary (Last 24 hours) at 07/02/15 1617 Last data filed at 07/02/15 0438  Gross per 24 hour  Intake    240 ml  Output   1400 ml  Net  -1160 ml   Filed Weights   07/02/15 0902  Weight: 164.021 kg (361 lb 9.6 oz)    Exam:   General:  Alert and oriented 3, no acute distress  Cardiovascular: Regular rate and rhythm, S1-S2  Respiratory: Clear to auscultation bilaterally  Abdomen: Soft, obese, nontender, positive bowel sounds  Musculoskeletal: Trace pitting edema bilaterally   Data Reviewed: Basic Metabolic Panel:  Recent Labs Lab 07/01/15 1348 07/02/15 0500  NA 135 135  K 3.9 3.6  CL 102 102  CO2 27 28  GLUCOSE 298* 216*  BUN 8 8  CREATININE 0.81 0.67  CALCIUM 8.4* 8.1*   Liver Function Tests: No results for input(s): AST, ALT, ALKPHOS, BILITOT, PROT, ALBUMIN in the last 168 hours. No results for input(s): LIPASE, AMYLASE in the last 168 hours. No results for input(s): AMMONIA in the last 168 hours. CBC:  Recent Labs Lab 07/01/15 1348 07/01/15 2217 07/02/15 0500  WBC 8.6 9.1 8.4  NEUTROABS 6.0  --   --   HGB 12.8* 11.9* 12.8*  HCT 38.1* 36.0* 38.6*  MCV 88.2 88.5 89.8  PLT 192 181 202   Cardiac Enzymes:    Recent Labs Lab 07/01/15 1656 07/01/15 2217 07/02/15 0500 07/02/15 1050  TROPONINI <  0.03 <0.03 <0.03 <0.03   BNP (last 3 results)  Recent Labs  09/14/14 0644 12/22/14 2330 07/02/15 1439  BNP 95.8 120.1* 320.3*    ProBNP (last 3 results)  Recent Labs  08/27/14 1548  PROBNP 1121.0*    CBG:  Recent Labs Lab 07/01/15 1830 07/01/15 2115 07/02/15 0800 07/02/15 1124  GLUCAP 236* 257* 198* 197*    Recent Results (from the past 240 hour(s))  Urine culture     Status: None (Preliminary result)   Collection Time:  07/01/15  2:55 PM  Result Value Ref Range Status   Specimen Description URINE, CATHETERIZED  Final   Special Requests NONE  Final   Culture   Final    >=100,000 COLONIES/mL GRAM NEGATIVE RODS Performed at [facility]    Report Status PENDING  Incomplete  MRSA PCR Screening     Status: Abnormal   Collection Time: 07/01/15  9:39 PM  Result Value Ref Range Status   MRSA by PCR POSITIVE (A) NEGATIVE Final    Comment:        The GeneXpert MRSA Assay (FDA approved for NASAL specimens only), is one component of a comprehensive MRSA colonization surveillance program. It is not intended to diagnose MRSA infection nor to guide or monitor treatment for MRSA infections. RESULT CALLED TO, READ BACK BY AND VERIFIED WITH: HAMILTON,P RN [phone] [id number] COVINGTON,N      Studies: No results found.  Scheduled Meds: . aspirin EC  81 mg Oral q morning - 10a  . Chlorhexidine Gluconate Cloth  6 each Topical Q0600  . ciprofloxacin  400 mg Intravenous Q12H  . enoxaparin (LOVENOX) injection  70 mg Subcutaneous Q24H  . ezetimibe  10 mg Oral Daily  . furosemide  20 mg Intravenous Q12H  . insulin aspart  0-9 Units Subcutaneous TID WC  . insulin NPH Human  15 Units Subcutaneous BID AC & HS  . isosorbide mononitrate  30 mg Oral Daily  . metoprolol  50 mg Oral BID  . mupirocin ointment  1 application Nasal BID  . pantoprazole  40 mg Oral Daily  . sodium chloride  3 mL Intravenous Q12H  . sodium chloride  3 mL Intravenous Q12H    Continuous Infusions:    Time spent: 25 minutes  [facility] Pager [phone]. If 7PM-7AM, please contact night-coverage at [email or web address], password [facility] 07/02/2015, 4:17 PM

## 2015-07-02 NOTE — Progress Notes (Signed)
@   315am patient reported chest pain 9/10  in mid upper chest . Assess patient . Vss. Sat room air.97%,A &O x 4 . Mild radiation to left arm. . Gave 1 nitro at 0325 with no relief recheck vs 111/78 and 98% on RA. Gave 2nd nitro a@ 0330. Tolerated. C/o of mild nausea and continued chest pain. 12 lead ekg completed.. Review ekg. Remained with patient. Pain decreased to 5/10. Will contact MD to make aware for additional orders. Pt is resting better at this time.

## 2015-07-02 NOTE — Progress Notes (Signed)
@   204 am patient has a asymptomatic 4 beat run of v-tach. Assessed patient and he was awake laughing. No CP during this episode. VVS and documented.

## 2015-07-02 NOTE — Progress Notes (Signed)
[facility] hospitalist progress note. Chief complaint. Chest pain. History of present illness. This 69 year old male was admitted recently with complaints of weakness. Does have a history of coronary artery disease and nonischemic cardiomyopathy. He complained of central chest pain to the nursing staff. This with some radiation to the left arm. No diaphoresis but the patient states he did feel nauseous. Patient was given sublingual nitroglycerin with improvement and near resolution of his chest pain. The patient states that he routinely has chest pain at home and utilizes sublingual nitroglycerin for this. 12-lead EKG was obtained and this does not look significantly different than prior EKG and does not look acutely ischemic. Physical exam. Vital signs. Temperature 90.8, pulse 66, respiration 16, blood pressure 130/52. O2 sats 99%. General appearance. Obese elderly male who is alert and in no distress. Cardiac. Regular rhythm regular with 2/6 systolic ejection murmur. Lungs. Breath sounds are clear. Abdomen. Soft and obese with positive bowel sounds. Impression/plan. Problem #1. Chest pain. Current EKG does not look acutely ischemic. Chest pain improved/nearly resolved status post sublingual nitroglycerin. I will obtain troponin now and then every 6 hours for a total of 3 sets. I will repeat a 12-lead EKG in 6 hours to evaluate for any changes.

## 2015-07-03 DIAGNOSIS — I2510 Atherosclerotic heart disease of native coronary artery without angina pectoris: Secondary | ICD-10-CM

## 2015-07-03 DIAGNOSIS — N390 Urinary tract infection, site not specified: Secondary | ICD-10-CM | POA: Diagnosis not present

## 2015-07-03 DIAGNOSIS — Z8701 Personal history of pneumonia (recurrent): Secondary | ICD-10-CM | POA: Diagnosis not present

## 2015-07-03 DIAGNOSIS — Z794 Long term (current) use of insulin: Secondary | ICD-10-CM | POA: Diagnosis not present

## 2015-07-03 DIAGNOSIS — Z7982 Long term (current) use of aspirin: Secondary | ICD-10-CM | POA: Diagnosis not present

## 2015-07-03 DIAGNOSIS — T83518A Infection and inflammatory reaction due to other urinary catheter, initial encounter: Secondary | ICD-10-CM | POA: Diagnosis present

## 2015-07-03 DIAGNOSIS — E785 Hyperlipidemia, unspecified: Secondary | ICD-10-CM | POA: Diagnosis present

## 2015-07-03 DIAGNOSIS — E1165 Type 2 diabetes mellitus with hyperglycemia: Secondary | ICD-10-CM | POA: Diagnosis present

## 2015-07-03 DIAGNOSIS — Z8249 Family history of ischemic heart disease and other diseases of the circulatory system: Secondary | ICD-10-CM | POA: Diagnosis not present

## 2015-07-03 DIAGNOSIS — Z888 Allergy status to other drugs, medicaments and biological substances status: Secondary | ICD-10-CM | POA: Diagnosis not present

## 2015-07-03 DIAGNOSIS — M1990 Unspecified osteoarthritis, unspecified site: Secondary | ICD-10-CM | POA: Diagnosis not present

## 2015-07-03 DIAGNOSIS — E0843 Diabetes mellitus due to underlying condition with diabetic autonomic (poly)neuropathy: Secondary | ICD-10-CM

## 2015-07-03 DIAGNOSIS — R262 Difficulty in walking, not elsewhere classified: Secondary | ICD-10-CM | POA: Diagnosis not present

## 2015-07-03 DIAGNOSIS — R072 Precordial pain: Secondary | ICD-10-CM | POA: Diagnosis not present

## 2015-07-03 DIAGNOSIS — Z881 Allergy status to other antibiotic agents status: Secondary | ICD-10-CM | POA: Diagnosis not present

## 2015-07-03 DIAGNOSIS — Z79899 Other long term (current) drug therapy: Secondary | ICD-10-CM | POA: Diagnosis not present

## 2015-07-03 DIAGNOSIS — N319 Neuromuscular dysfunction of bladder, unspecified: Secondary | ICD-10-CM | POA: Diagnosis present

## 2015-07-03 DIAGNOSIS — I5032 Chronic diastolic (congestive) heart failure: Secondary | ICD-10-CM | POA: Diagnosis not present

## 2015-07-03 DIAGNOSIS — Z806 Family history of leukemia: Secondary | ICD-10-CM | POA: Diagnosis not present

## 2015-07-03 DIAGNOSIS — R41841 Cognitive communication deficit: Secondary | ICD-10-CM | POA: Diagnosis not present

## 2015-07-03 DIAGNOSIS — G4733 Obstructive sleep apnea (adult) (pediatric): Secondary | ICD-10-CM

## 2015-07-03 DIAGNOSIS — G8929 Other chronic pain: Secondary | ICD-10-CM | POA: Diagnosis present

## 2015-07-03 DIAGNOSIS — I4510 Unspecified right bundle-branch block: Secondary | ICD-10-CM | POA: Diagnosis present

## 2015-07-03 DIAGNOSIS — I209 Angina pectoris, unspecified: Secondary | ICD-10-CM | POA: Diagnosis not present

## 2015-07-03 DIAGNOSIS — I240 Acute coronary thrombosis not resulting in myocardial infarction: Secondary | ICD-10-CM | POA: Diagnosis present

## 2015-07-03 DIAGNOSIS — I495 Sick sinus syndrome: Secondary | ICD-10-CM

## 2015-07-03 DIAGNOSIS — I509 Heart failure, unspecified: Secondary | ICD-10-CM | POA: Diagnosis not present

## 2015-07-03 DIAGNOSIS — W1830XA Fall on same level, unspecified, initial encounter: Secondary | ICD-10-CM | POA: Diagnosis present

## 2015-07-03 DIAGNOSIS — G4710 Hypersomnia, unspecified: Secondary | ICD-10-CM | POA: Diagnosis present

## 2015-07-03 DIAGNOSIS — B965 Pseudomonas (aeruginosa) (mallei) (pseudomallei) as the cause of diseases classified elsewhere: Secondary | ICD-10-CM | POA: Diagnosis present

## 2015-07-03 DIAGNOSIS — M25562 Pain in left knee: Secondary | ICD-10-CM | POA: Diagnosis not present

## 2015-07-03 DIAGNOSIS — Z6841 Body Mass Index (BMI) 40.0 and over, adult: Secondary | ICD-10-CM | POA: Diagnosis not present

## 2015-07-03 DIAGNOSIS — E1151 Type 2 diabetes mellitus with diabetic peripheral angiopathy without gangrene: Secondary | ICD-10-CM | POA: Diagnosis not present

## 2015-07-03 DIAGNOSIS — Z9181 History of falling: Secondary | ICD-10-CM | POA: Diagnosis not present

## 2015-07-03 DIAGNOSIS — Z885 Allergy status to narcotic agent status: Secondary | ICD-10-CM | POA: Diagnosis not present

## 2015-07-03 DIAGNOSIS — B961 Klebsiella pneumoniae [K. pneumoniae] as the cause of diseases classified elsewhere: Secondary | ICD-10-CM | POA: Diagnosis present

## 2015-07-03 DIAGNOSIS — Z8043 Family history of malignant neoplasm of testis: Secondary | ICD-10-CM | POA: Diagnosis not present

## 2015-07-03 DIAGNOSIS — R079 Chest pain, unspecified: Secondary | ICD-10-CM | POA: Diagnosis not present

## 2015-07-03 DIAGNOSIS — I440 Atrioventricular block, first degree: Secondary | ICD-10-CM | POA: Diagnosis present

## 2015-07-03 DIAGNOSIS — Z833 Family history of diabetes mellitus: Secondary | ICD-10-CM | POA: Diagnosis not present

## 2015-07-03 DIAGNOSIS — Z87891 Personal history of nicotine dependence: Secondary | ICD-10-CM | POA: Diagnosis not present

## 2015-07-03 DIAGNOSIS — M6281 Muscle weakness (generalized): Secondary | ICD-10-CM | POA: Diagnosis not present

## 2015-07-03 DIAGNOSIS — R531 Weakness: Secondary | ICD-10-CM | POA: Diagnosis not present

## 2015-07-03 DIAGNOSIS — I350 Nonrheumatic aortic (valve) stenosis: Secondary | ICD-10-CM | POA: Diagnosis present

## 2015-07-03 DIAGNOSIS — M545 Low back pain: Secondary | ICD-10-CM | POA: Diagnosis present

## 2015-07-03 DIAGNOSIS — I10 Essential (primary) hypertension: Secondary | ICD-10-CM | POA: Diagnosis not present

## 2015-07-03 DIAGNOSIS — I110 Hypertensive heart disease with heart failure: Secondary | ICD-10-CM | POA: Diagnosis present

## 2015-07-03 DIAGNOSIS — R0789 Other chest pain: Secondary | ICD-10-CM | POA: Diagnosis not present

## 2015-07-03 DIAGNOSIS — E1142 Type 2 diabetes mellitus with diabetic polyneuropathy: Secondary | ICD-10-CM | POA: Diagnosis not present

## 2015-07-03 DIAGNOSIS — K219 Gastro-esophageal reflux disease without esophagitis: Secondary | ICD-10-CM | POA: Diagnosis not present

## 2015-07-03 DIAGNOSIS — Z8673 Personal history of transient ischemic attack (TIA), and cerebral infarction without residual deficits: Secondary | ICD-10-CM | POA: Diagnosis not present

## 2015-07-03 DIAGNOSIS — Y846 Urinary catheterization as the cause of abnormal reaction of the patient, or of later complication, without mention of misadventure at the time of the procedure: Secondary | ICD-10-CM | POA: Diagnosis present

## 2015-07-03 DIAGNOSIS — I428 Other cardiomyopathies: Secondary | ICD-10-CM | POA: Diagnosis present

## 2015-07-03 DIAGNOSIS — I639 Cerebral infarction, unspecified: Secondary | ICD-10-CM | POA: Diagnosis not present

## 2015-07-03 DIAGNOSIS — E1140 Type 2 diabetes mellitus with diabetic neuropathy, unspecified: Secondary | ICD-10-CM | POA: Diagnosis present

## 2015-07-03 LAB — BASIC METABOLIC PANEL
ANION GAP: 8 (ref 5–15)
BUN: 9 mg/dL (ref 6–20)
CHLORIDE: 97 mmol/L — AB (ref 101–111)
CO2: 28 mmol/L (ref 22–32)
Calcium: 8.3 mg/dL — ABNORMAL LOW (ref 8.9–10.3)
Creatinine, Ser: 0.64 mg/dL (ref 0.61–1.24)
GFR calc Af Amer: >60 mL/min (ref >60)
GFR calc non Af Amer: >60 mL/min (ref >60)
GLUCOSE: 241 mg/dL — AB (ref 65–99)
POTASSIUM: 3.5 mmol/L (ref 3.5–5.1)
Sodium: 133 mmol/L — ABNORMAL LOW (ref 135–145)

## 2015-07-03 LAB — TROPONIN I

## 2015-07-03 LAB — GLUCOSE, CAPILLARY
GLUCOSE-CAPILLARY: 148 mg/dL — AB (ref 65–99)
GLUCOSE-CAPILLARY: 207 mg/dL — AB (ref 65–99)
Glucose-Capillary: 208 mg/dL — ABNORMAL HIGH (ref 65–99)
Glucose-Capillary: 231 mg/dL — ABNORMAL HIGH (ref 65–99)

## 2015-07-03 MED ORDER — METOPROLOL TARTRATE 25 MG PO TABS
25.0000 mg | ORAL_TABLET | Freq: Two times a day (BID) | ORAL | Status: DC
  Administered 2015-07-03 – 2015-07-06 (×6): 25 mg via ORAL
  Filled 2015-07-03 (×6): qty 1

## 2015-07-03 MED ORDER — INSULIN NPH (HUMAN) (ISOPHANE) 100 UNIT/ML ~~LOC~~ SUSP
18.0000 [IU] | Freq: Two times a day (BID) | SUBCUTANEOUS | Status: DC
  Administered 2015-07-03 – 2015-07-06 (×6): 18 [IU] via SUBCUTANEOUS
  Filled 2015-07-03 (×2): qty 10

## 2015-07-03 MED ORDER — ENOXAPARIN SODIUM 80 MG/0.8ML ~~LOC~~ SOLN
80.0000 mg | SUBCUTANEOUS | Status: DC
  Administered 2015-07-03 – 2015-07-05 (×3): 80 mg via SUBCUTANEOUS
  Filled 2015-07-03 (×4): qty 0.8

## 2015-07-03 MED ORDER — FOSFOMYCIN TROMETHAMINE 3 G PO PACK
3.0000 g | PACK | ORAL | Status: DC
Start: 2015-07-03 — End: 2015-07-04
  Administered 2015-07-03: 3 g via ORAL
  Filled 2015-07-03: qty 3

## 2015-07-03 MED ORDER — ISOSORBIDE MONONITRATE ER 30 MG PO TB24
60.0000 mg | ORAL_TABLET | Freq: Every day | ORAL | Status: DC
Start: 2015-07-04 — End: 2015-07-06
  Administered 2015-07-04 – 2015-07-06 (×3): 60 mg via ORAL
  Filled 2015-07-03 (×3): qty 2

## 2015-07-03 NOTE — Consult Note (Signed)
Cardiology Consultation     Patient ID: Nathian, Stencil [id number], 10-Dec-1945 Admit date: 07/01/2015 Date of Consult: 07/03/2015  Primary Physician: [name] Referring Physician: Dr. Gevena Barre  Chief Complaint: chest pain Reason for Consultation:  Chest pain and pauses  HPI: Mr. Gilardi  is a 69 year old Caucasian male who has a history of obesity, hypertension, diabetes mellitus with diabetic neuropathy and neurogenic bladder, untreated sleep apnea due to intolerability to a full face mask who has significant arthritis particularly involving his knees for which he has been unable to ambulate and has been using a wheelchair.  He has a history of coronary artery disease and initially underwent cardiac catheterization in 2013 by Dr. Marlou Porch.  He was felt to have severe diffuse non-revascularizable distal RCA disease with minimal disease in the left system.  He had normal LV function.  He develop recurrent chest pain leading to hospitalization in August 2016.  A Myoview stress test showed lateral ischemia with reduced LV function at 30%. He underwent repeat cardiac catheterization initially on May 02, 2015 by Dr. Gwenlyn Found via the right radial approach, which revealed an occluded PLA system of his RCA compared to his catheterization in 2013.   With the radial approach, he was unable to engage left coronary system.  The patient developed a catheter dissection at the ostium of the RCA with staining which spiraled into the mid vessel.  Although the initial intent was to stent this vessel the dissection ultimately closed spontaneously resulting in a patent RCA lumen with normal flow and as result the decision was made not to intervene.  He was brought back to the laboratory 2 days later and subsequent catheterization was performed via the femoral approach by Dr. Audelia Acton.  This revealed a healed RCA dissection with moderate stenosis distally with an occluded AV segment as was noted previously.   There was mild CAD involving the left coronary system with 40% stenoses in the first and second diagonal, and 20-30% proximal LAD stenoses.  The circumflex had minor luminal irregularities.  Medical therapy was recommended.  The patient was admitted to the hospitalist service after falling while attempting to get up when his badly twisted.  He denied any true presyncope or syncope.  He hit his head.  He did twist his body.  The patient has experienced some recurrent episodes of chest tightness during his hospitalization and recently also admits to experiencing some chest tightness prior to coming into the hospital.  Telemetry has revealed mild pauses of 1.6 seconds for which she is asymptomatic.  His sleep is very poor.  He notes hypersomnolence.  He does not use CPAP therapy for his obstructive sleep apnea.  Because of his recurrent episodes of chest pressure and mild sinus pauses on telemetry cardiology consultation was requested.   Past Medical History  Diagnosis Date  . Obese   . Hypertension   . Diabetic neuropathy, painful ([place])   . Chronic indwelling Foley catheter   . Bladder spasms   . Hyperlipidemia   . GERD (gastroesophageal reflux disease)   . Neurogenic bladder   . CAD (coronary artery disease)   . PONV (postoperative nausea and vomiting)   . CHF (congestive heart failure) ([place])   . DVT (deep venous thrombosis) ([facility]) 1980's    LLE  . Type II diabetes mellitus ([place])   . Diabetic diarrhea ([place])   . Pneumonia 1999  . OSA (obstructive sleep apnea)     "they wanted me to wear a mask; I couldn't" (  11/10/2014)  . Arthritis     "hands, ankles, knees" (11/10/2014)  . Chronic lower back pain   . Stroke [facility]) 10/2011; 06/2014    right hand numbness/notes 10/20/2011, pt not sure he had stroke in 2013; "right hand weaker and right face not quite right since" (11/10/2014)   Past Surgical History  Procedure Laterality Date  . Gastroplasty    . Total knee arthroplasty Right 1990's  . Left  heart catheterization with coronary angiogram N/A 10/24/2011    Procedure: LEFT HEART CATHETERIZATION WITH CORONARY ANGIOGRAM;  Surgeon: Candee Furbish, MD;  Location: [facility] CATH LAB;  Service: Cardiovascular;  Laterality: N/A;  right radial artery approach  . Cholecystectomy open  1970's?  . Joint replacement    . Carpal tunnel release Bilateral ~ 2003-2004  . Cardiac catheterization N/A 05/02/2015    Procedure: Left Heart Cath and Coronary Angiography;  Surgeon: Lorretta Harp, MD;  Location: [place] CV LAB;  Service: Cardiovascular;  Laterality: N/A;  . Cardiac catheterization N/A 05/04/2015    Procedure: Left Heart Cath and Coronary Angiography;  Surgeon: Wellington Hampshire, MD;  Location: [place] CV LAB;  Service: Cardiovascular;  Laterality: N/A;    FAMHx: Family History  Problem Relation Age of Onset  . Hypertension Mother   . Diabetes type I Father   . Cancer Brother      Testicular Cancer  . Cancer Brother     Leukemia    SOCHx:  reports that he quit smoking about 38 years ago. His smoking use included Cigarettes. He has a 40 pack-year smoking history. He has never used smokeless tobacco. He reports that he does not drink alcohol or use illicit drugs.   Additional social history is notable in that he quit smoking cigarettes in 1973.  He quit drinking alcohol in 1981. He is widowed.  He lives by himself.  He has no children.  ALLERGIES: Allergies  Allergen Reactions  . Ace Inhibitors Swelling    Pt tolerates lisinopril  . Lipitor [Atorvastatin Calcium] Swelling  . Metformin And Related Swelling  . Cefadroxil Hives  . Cephalexin Hives  . Morphine And Related Other (See Comments)    Sweating, feels like is "in rocky boat."  . Robaxin [Methocarbamol] Other (See Comments)    Feels like he is shaky     HOME MEDICATIONS: Prescriptions prior to admission  Medication Sig Dispense Refill Last Dose  . acetaminophen (TYLENOL) 325 MG tablet Take 2 tablets (650 mg total) by  mouth every 4 (four) hours as needed for headache or mild pain.   unknown  . aspirin EC 81 MG tablet Take 81 mg by mouth every morning.    07/01/2015 at Unknown time  . calcium carbonate (TUMS - DOSED IN MG ELEMENTAL CALCIUM) 500 MG chewable tablet Chew 1 tablet (200 mg of elemental calcium total) by mouth 3 (three) times daily as needed for indigestion or heartburn.   unknown  . ciprofloxacin (CIPRO) 500 MG tablet Take 1 tablet (500 mg total) by mouth every 12 (twelve) hours. 10 tablet 0 07/01/2015 at Unknown time  . diphenoxylate-atropine (LOMOTIL) 2.5-0.025 MG per tablet Take 1 tablet by mouth 3 (three) times daily as needed. For diarrhea 90 tablet 0 Past Week at Unknown time  . ezetimibe (ZETIA) 10 MG tablet Take 1 tablet (10 mg total) by mouth daily. 30 tablet 11 07/01/2015 at Unknown time  . fexofenadine (ALLEGRA) 180 MG tablet Take 180 mg by mouth daily as needed for allergies or rhinitis.  unknown  . furosemide (LASIX) 40 MG tablet Take 1 tablet (40 mg total) by mouth daily. 30 tablet 11 07/01/2015 at Unknown time  . insulin NPH Human (HUMULIN N) 100 UNIT/ML injection Inject 0.2 mLs (20 Units total) into the skin 2 (two) times daily at 8 am and 10 pm. 10 mL 11 07/01/2015 at Unknown time  . isosorbide mononitrate (IMDUR) 30 MG 24 hr tablet Take 1 tablet (30 mg total) by mouth daily. 30 tablet 11 07/01/2015 at Unknown time  . lisinopril (PRINIVIL,ZESTRIL) 5 MG tablet Take 5 mg by mouth daily.  3 07/01/2015 at Unknown time  . losartan (COZAAR) 25 MG tablet Take 1 tablet (25 mg total) by mouth daily. 30 tablet 11 07/01/2015 at Unknown time  . metoprolol (LOPRESSOR) 50 MG tablet Take 1 tablet (50 mg total) by mouth 2 (two) times daily.   07/01/2015 at 0800  . nitroGLYCERIN (NITROSTAT) 0.4 MG SL tablet Place 1 tablet (0.4 mg total) under the tongue every 5 (five) minutes as needed for chest pain. 25 tablet 2 Past Week at Unknown time  . omeprazole (PRILOSEC) 20 MG capsule Take 20 mg by mouth daily.    07/01/2015 at Unknown time  . oxybutynin (DITROPAN) 5 MG tablet Take 5 mg by mouth daily as needed for bladder spasms.   unknown  . senna-docusate (SENOKOT-S) 8.6-50 MG per tablet Take 2 tablets by mouth daily. (Patient taking differently: Take 2 tablets by mouth 2 (two) times daily as needed for moderate constipation. ) 28 tablet 0 unknown  . HYDROcodone-acetaminophen (NORCO/VICODIN) 5-325 MG per tablet Take 1 tablet by mouth every 6 (six) hours as needed for severe pain. (Patient not taking: Reported on 07/01/2015) 10 tablet 0 Not Taking at Unknown time  . nitrofurantoin, macrocrystal-monohydrate, (MACROBID) 100 MG capsule Take 1 capsule (100 mg total) by mouth 2 (two) times daily. (Patient not taking: Reported on 07/01/2015) 10 capsule 0 Completed Course at Unknown time    HOSPITAL MEDICATIONS: . aspirin EC  81 mg Oral q morning - 10a  . Chlorhexidine Gluconate Cloth  6 each Topical Q0600  . ciprofloxacin  400 mg Intravenous Q12H  . enoxaparin (LOVENOX) injection  80 mg Subcutaneous Q24H  . ezetimibe  10 mg Oral Daily  . furosemide  20 mg Intravenous BID  . insulin aspart  0-9 Units Subcutaneous TID WC  . insulin NPH Human  18 Units Subcutaneous BID AC & HS  . isosorbide mononitrate  30 mg Oral Daily  . metoprolol  50 mg Oral BID  . mupirocin ointment  1 application Nasal BID  . pantoprazole  40 mg Oral Daily  . sodium chloride  3 mL Intravenous Q12H  . sodium chloride  3 mL Intravenous Q12H    ROS General: Negative; No fevers, chills, or night sweats;  HEENT: Negative; No changes in vision or hearing, sinus congestion, difficulty swallowing Pulmonary: Negative; No cough, wheezing, shortness of breath, hemoptysis Cardiovascular: See history of present illness GI: Positive for occasional diarrhea GU: Positive for neurogenic bladder Musculoskeletal: Positive for arthritis in hands, ankles and knees Hematologic/Oncology: Negative; no easy bruising, bleeding Endocrine: Positive for  diabetes mellitus Neuro: Positive for possible stroke 2013; Skin: Negative; No rashes or skin lesions Psychiatric: Negative; No behavioral problems, depression Sleep: Positive for obstructive sleep apnea; did not tolerate CPAP therapy.   Other comprehensive 14 point system review is negative.  VITALS: Blood pressure 117/57, pulse 57, temperature 98.2 F (36.8 C), temperature source Oral, resp. rate 14, height _0  (  1.854 m), weight 361 lb 0.1 oz (163.75 kg), SpO2 99 %.  PHYSICAL EXAM: General appearance: alert, fatigued and no distress Neck: Thick neck; no adenopathy, no carotid bruit, no JVD, supple, symmetrical, trachea midline and thyroid not enlarged, symmetric, no tenderness/mass/nodules Lungs: no wheezing Heart: regular rate and rhythm and 1/6 systolic murmur, no rub thrills or heaves. Abdomen: Obese, nontender.  Bowel sounds positive GU: Foley in place. Extremities: Erythema, bandaged, chronic edema Pulses: 2+ and symmetric Neurologic: Grossly normal  ECG (independently read by me): Normal sinus rhythm with first-degree AV block, left axis deviation; poor R-wave progression.  Q wave in lead 3.  LABS: Results for orders placed or performed during the hospital encounter of 07/01/15 (from the past 48 hour(s))  Troponin I (q 6hr x 3)     Status: None   Collection Time: 07/01/15  4:56 PM  Result Value Ref Range   Troponin I <0.03 <0.031 ng/mL    Comment:        NO INDICATION OF MYOCARDIAL INJURY.   Glucose, capillary     Status: Abnormal   Collection Time: 07/01/15  6:30 PM  Result Value Ref Range   Glucose-Capillary 236 (H) 65 - 99 mg/dL  Glucose, capillary     Status: Abnormal   Collection Time: 07/01/15  9:15 PM  Result Value Ref Range   Glucose-Capillary 257 (H) 65 - 99 mg/dL  MRSA PCR Screening     Status: Abnormal   Collection Time: 07/01/15  9:39 PM  Result Value Ref Range   MRSA by PCR POSITIVE (A) NEGATIVE    Comment:        The GeneXpert MRSA Assay  (FDA approved for NASAL specimens only), is one component of a comprehensive MRSA colonization surveillance program. It is not intended to diagnose MRSA infection nor to guide or monitor treatment for MRSA infections. RESULT CALLED TO, READ BACK BY AND VERIFIED WITH: HAMILTON,P RN [id number] COVINGTON,N   Troponin I (q 6hr x 3)     Status: None   Collection Time: 07/01/15 10:17 PM  Result Value Ref Range   Troponin I <0.03 <0.031 ng/mL    Comment:        NO INDICATION OF MYOCARDIAL INJURY.   CBC     Status: Abnormal   Collection Time: 07/01/15 10:17 PM  Result Value Ref Range   WBC 9.1 4.0 - 10.5 K/uL   RBC 4.07 (L) 4.22 - 5.81 MIL/uL   Hemoglobin 11.9 (L) 13.0 - 17.0 g/dL   HCT 36.0 (L) 39.0 - 52.0 %   MCV 88.5 78.0 - 100.0 fL   MCH 29.2 26.0 - 34.0 pg   MCHC 33.1 30.0 - 36.0 g/dL   RDW 14.4 11.5 - 15.5 %   Platelets 181 150 - 400 K/uL  Troponin I (q 6hr x 3)     Status: None   Collection Time: 07/02/15  5:00 AM  Result Value Ref Range   Troponin I <0.03 <0.031 ng/mL    Comment:        NO INDICATION OF MYOCARDIAL INJURY.   CBC     Status: Abnormal   Collection Time: 07/02/15  5:00 AM  Result Value Ref Range   WBC 8.4 4.0 - 10.5 K/uL   RBC 4.30 4.22 - 5.81 MIL/uL   Hemoglobin 12.8 (L) 13.0 - 17.0 g/dL   HCT 38.6 (L) 39.0 - 52.0 %   MCV 89.8 78.0 - 100.0 fL   MCH 29.8 26.0 - 34.0 pg  MCHC 33.2 30.0 - 36.0 g/dL   RDW 14.7 11.5 - 15.5 %   Platelets 202 150 - 400 K/uL  Basic metabolic panel     Status: Abnormal   Collection Time: 07/02/15  5:00 AM  Result Value Ref Range   Sodium 135 135 - 145 mmol/L   Potassium 3.6 3.5 - 5.1 mmol/L   Chloride 102 101 - 111 mmol/L   CO2 28 22 - 32 mmol/L   Glucose, Bld 216 (H) 65 - 99 mg/dL   BUN 8 6 - 20 mg/dL   Creatinine, Ser 0.67 0.61 - 1.24 mg/dL   Calcium 8.1 (L) 8.9 - 10.3 mg/dL   GFR calc non Af Amer >60 >60 mL/min   GFR calc Af Amer >60 >60 mL/min    Comment: (NOTE) The eGFR has been calculated using the CKD  EPI equation. This calculation has not been validated in all clinical situations. eGFR's persistently <60 mL/min signify possible Chronic Kidney Disease.    Anion gap 5 5 - 15  Glucose, capillary     Status: Abnormal   Collection Time: 07/02/15  8:00 AM  Result Value Ref Range   Glucose-Capillary 198 (H) 65 - 99 mg/dL  Troponin I (q 6hr x 3)     Status: None   Collection Time: 07/02/15 10:50 AM  Result Value Ref Range   Troponin I <0.03 <0.031 ng/mL    Comment:        NO INDICATION OF MYOCARDIAL INJURY.   Glucose, capillary     Status: Abnormal   Collection Time: 07/02/15 11:24 AM  Result Value Ref Range   Glucose-Capillary 197 (H) 65 - 99 mg/dL  Brain natriuretic peptide     Status: Abnormal   Collection Time: 07/02/15  2:39 PM  Result Value Ref Range   B Natriuretic Peptide 320.3 (H) 0.0 - 100.0 pg/mL  Glucose, capillary     Status: Abnormal   Collection Time: 07/02/15  4:31 PM  Result Value Ref Range   Glucose-Capillary 186 (H) 65 - 99 mg/dL  Troponin I (q 6hr x 3)     Status: None   Collection Time: 07/02/15  5:05 PM  Result Value Ref Range   Troponin I <0.03 <0.031 ng/mL    Comment:        NO INDICATION OF MYOCARDIAL INJURY.   Glucose, capillary     Status: Abnormal   Collection Time: 07/02/15  9:53 PM  Result Value Ref Range   Glucose-Capillary 243 (H) 65 - 99 mg/dL  Basic metabolic panel     Status: Abnormal   Collection Time: 07/03/15  6:20 AM  Result Value Ref Range   Sodium 133 (L) 135 - 145 mmol/L   Potassium 3.5 3.5 - 5.1 mmol/L   Chloride 97 (L) 101 - 111 mmol/L   CO2 28 22 - 32 mmol/L   Glucose, Bld 241 (H) 65 - 99 mg/dL   BUN 9 6 - 20 mg/dL   Creatinine, Ser 0.64 0.61 - 1.24 mg/dL   Calcium 8.3 (L) 8.9 - 10.3 mg/dL   GFR calc non Af Amer >60 >60 mL/min   GFR calc Af Amer >60 >60 mL/min    Comment: (NOTE) The eGFR has been calculated using the CKD EPI equation. This calculation has not been validated in all clinical situations. eGFR's  persistently <60 mL/min signify possible Chronic Kidney Disease.    Anion gap 8 5 - 15  Glucose, capillary     Status: Abnormal   Collection  Time: 07/03/15  7:18 AM  Result Value Ref Range   Glucose-Capillary 208 (H) 65 - 99 mg/dL  Glucose, capillary     Status: Abnormal   Collection Time: 07/03/15 11:46 AM  Result Value Ref Range   Glucose-Capillary 231 (H) 65 - 99 mg/dL  Troponin I     Status: None   Collection Time: 07/03/15  1:58 PM  Result Value Ref Range   Troponin I <0.03 <0.031 ng/mL    Comment:        NO INDICATION OF MYOCARDIAL INJURY.     IMAGING: No results found. -------------------------------------------------------------------  11/15/2014 Echo Study Conclusions  - Left ventricle: The cavity size was normal. There was moderate concentric hypertrophy. Systolic function was normal. The estimated ejection fraction was in the range of 50% to 55%. Wall motion was normal; there were no regional wall motion abnormalities. Doppler parameters are consistent with abnormal left ventricular relaxation (grade 1 diastolic dysfunction). - Aortic valve: Trileaflet; severely thickened, severely calcified leaflets. Valve mobility was restricted. There was mild stenosis. There was trivial regurgitation. Valve area (VTI): 1.09 cm^2. Valve area (Vmax): 1.14 cm^2. Valve area (Vmean): 1.06 cm^2. - Aortic root: The aortic root was normal in size. - Mitral valve: Calcified annulus. Mildly thickened leaflets . There was trivial regurgitation. - Left atrium: The atrium was severely dilated. - Right ventricle: The cavity size was moderately dilated. Wall thickness was normal. Systolic function was normal. - Right atrium: The atrium was mildly dilated. - Tricuspid valve: There was mild regurgitation. - Pulmonary arteries: The main pulmonary artery was normal-sized. Systolic pressure was within the normal range. - Inferior vena cava: The vessel was normal in  size. - Pericardium, extracardiac: There was no pericardial effusion.  Impressions:  - There is no significant change when compared to the study from 06/27/2014.  IMPRESSION: 1. CAD: Recent catheterization in August 2016 demonstrated totally occluded PLA with catheter-induced spiral dissection of the proximal to mid RCA which ultimately sealed spontaneously and mild nonobstructive disease in the left coronary system.  At the time, medical therapy was recommended. 2.  Recurrent episodes of chest tightness.  ECG does not show acute ischemic changes.  He does have distal RCA occlusion which may be contributing to his recurrent chest tightness. 3.  Mild aortic valve stenosis noted on March 2016 echo Doppler study. 4.  Mild diastolic heart failure with BNP 320, and documented moderate left ventricular hypertrophy  5.  Mild sinus pauses of approximately 1.6 seconds; will reduce current dose of metoprolol from 50 mg twice a day to 25 mg twice a day. 6.  Klebsiella urinary tract infection 7.  Diabetes mellitus with neurogenic bladder and neuropathy 8.  Hyperlipidemia; currently on Zetia 10 mg.  Question if there is any intolerance to statin with consideration rechallenge to achieve target LDL less than 70. 9.  Sleep apnea, currently untreated secondary to difficulty with mask tolerability.   RECOMMENDATION: I would recommend a follow-up 2-D echo Doppler study to reassess systolic and diastolic function as well as potential progression of aortic valve stenosis.  The patient has chronic occlusion of the distal RCA PLA vessel and on repeat cath.  His catheter-induced dissection in the RCA had spontaneously healed and had normal flow and subsequent cath.  He states his chest tightness at home is nitrate responsive.  He has experienced recurrent episodes of chest fullness in the hospital without ECG changes.  Troponins are negative.  I will request a follow-up ECG.  I would empiracally  titrate  his  isosorbide mononitrate to 60 mg daily.  With his recent sinus pauses I will reduce his metoprolol to 25 mg twice a day.  If he continues to experience recurrent chest tightness symptomatology he may be a candidate for Ranexa 500 mg twice a day or the addition of amlodipine 5 mg to his medical regimen.  However, there is a history of peripheral edema and swelling which may be exacerbated by amlodipine.  If symptoms continue to be progressive, a repeat catheterization may be necessary and I would not repeat nuclear imaging due to his large body habitus and recent abnormality.  He does have mild diastolic heart failure and would benefit from continued diuretic regimen.  He is undergoing treatment for Klebsiella UTI.  Cardiology colleague will see in a.m. for further evaluation and recommendations.   Attending:  Troy Sine, MD, [facility] ([place]) 07/03/2015 3:04 PM

## 2015-07-03 NOTE — Progress Notes (Signed)
Nutrition Brief Note  Patient identified on the Malnutrition Screening Tool (MST) Report  Patient with recent weight gain, most likely d/t fluid accumulation with history of CHF. Pt reported 80 lb weight over 3 months, but unsure if this is an error.  Wt Readings from Last 15 Encounters:  07/03/15 361 lb 0.1 oz (163.75 kg)  06/07/15 345 lb (156.491 kg)  05/11/15 330 lb (149.687 kg)  05/10/15 329 lb (149.233 kg)  05/09/15 329 lb (149.233 kg)  05/06/15 344 lb 11.2 oz (156.355 kg)  04/19/15 350 lb (158.759 kg)  04/18/15 346 lb 6.4 oz (157.126 kg)  12/22/14 345 lb (156.491 kg)  12/09/14 352 lb 8 oz (159.893 kg)  11/19/14 341 lb 12.8 oz (155.039 kg)  11/11/14 339 lb 1.1 oz (153.8 kg)  09/23/14 351 lb (159.213 kg)  09/20/14 346 lb (156.945 kg)  09/07/14 345 lb (156.491 kg)    Body mass index is 47.64 kg/(m^2). Patient meets criteria for morbid obesity based on current BMI.   Current diet order is CHO modified. Labs and medications reviewed.   No nutrition interventions warranted at this time. If nutrition issues arise, please consult RD.   Clayton Bibles, MS, RD, LDN Pager: [phone] After Hours Pager: [phone]

## 2015-07-03 NOTE — Evaluation (Addendum)
Physical Therapy Evaluation Patient Details Name: [name] MRN: [id number] DOB: 11-10-1945 Today's Date: 07/03/2015   History of Present Illness  [name] is a 69 y.o. male with PMH significant for CAD, Cardiomyopathy non ischemic, HTN, diabetes, chronic foley catheter, who presents after a fall. He was standing with his walker, when he turn and his left knee give up. He hit his head with a table. He denies prior to the episode lightheaded, chest pain. He is complaining of left knee pain, unable to move left knee. He is not able to walk. He is feeling generalized weakness.' L knee xrays negative for fracture, positive for DJD  Clinical Impression  Patient presents with significant  Dysfunction related to  C/o L knee pain. Patient  Did not attempt to stand today. Will  Utilize a Knee immobilizer next visit to provide some stability. Patient lives alone, has been in [place] in recent past. Currently, patient is unable to care for himself and mobilize. Patient will benefit from PT to address problems listed in the note below. Upon sitting up  On the bed edge, patient had a few second period of "phasing out", head was forward, was not responding to voice. Shook patient and called his name and he then  Looked at this PT. Stated this happens  PTA. Patient reports that he is unaware of the episodes.   Follow Up Recommendations SNF;Supervision/Assistance - 24 hour    Equipment Recommendations  None recommended by PT    Recommendations for Other Services OT consult     Precautions / Restrictions Precautions Precautions: Fall Precaution Comments: L knee pain, had a "phase out period upon sitting" Restrictions Weight Bearing Restrictions: No      Mobility  Bed Mobility Overal bed mobility: Needs Assistance;+ 2 for safety/equipment Bed Mobility: Supine to Sit;Sit to Supine     Supine to sit: Max assist;HOB elevated Sit to supine: Total assist;+2 for physical assistance;+2 for  safety/equipment   General bed mobility comments: patient used the rail to assist  with trunk to sit up at the edge. Immediately u assist for legs onto the bedpon sitting up, patient had a few second episode of head forward and not focusing, niot responsive. Called out patient's  name and stimulated the patient and he responded. reports he has  these spells at home and is unaware.   Transfers                 General transfer comment: NT due to patient c/o L knee pain.  Ambulation/Gait                Stairs            Wheelchair Mobility    Modified Rankin (Stroke Patients Only)       Balance Overall balance assessment: Needs assistance;History of Falls Sitting-balance support: Feet supported;No upper extremity supported Sitting balance-Leahy Scale: Fair                                       Pertinent Vitals/Pain Pain Assessment: 0-10 Pain Score: 10-Worst pain ever Pain Location: L knee when moving the leg Pain Descriptors / Indicators: Grimacing;Guarding;Jabbing Pain Intervention(s): Limited activity within patient's tolerance;Repositioned    Home Living Family/patient expects to be discharged to:: Private residence Living Arrangements: Alone Available Help at Discharge: Friend(s);Available PRN/intermittently;Personal care attendant Type of Home: House Home Access: Ramped entrance  Home Layout: One level Home Equipment: Cane - single point;Grab bars - toilet;Grab bars - tub/shower;Electric scooter;Wheelchair - [employer] - 2 wheels;Walker - standard      Prior Function Level of Independence: Needs assistance   Gait / Transfers Assistance Needed: amb. with RW           Hand Dominance        Extremity/Trunk Assessment   Upper Extremity Assessment: Overall WFL for tasks assessed           Lower Extremity Assessment: LLE deficits/detail;RLE deficits/detail   LLE Deficits / Details: noted valgus deformity, knee  flexion about 60 degress on bed edge. required max assist to lift and move the leg off of  the bed and onto the bed.  Cervical / Trunk Assessment: Normal  Communication      Cognition Arousal/Alertness: Awake/alert Behavior During Therapy: WFL for tasks assessed/performed Overall Cognitive Status: Within Functional Limits for tasks assessed                      General Comments      Exercises        Assessment/Plan    PT Assessment Patient needs continued PT services  PT Diagnosis Difficulty walking;Generalized weakness;Acute pain   PT Problem List Decreased strength;Decreased range of motion;Decreased activity tolerance;Decreased mobility;Decreased knowledge of use of DME;Decreased safety awareness;Decreased knowledge of precautions;Obesity;Impaired sensation;Pain  PT Treatment Interventions DME instruction;Gait training;Functional mobility training;Therapeutic activities;Therapeutic exercise;Patient/family education   PT Goals (Current goals can be found in the Care Plan section) Acute Rehab PT Goals Patient Stated Goal: to return home, get pain gone from L knee. PT Goal Formulation: With patient Time For Goal Achievement: 07/17/15 Potential to Achieve Goals: Fair    Frequency Min 3X/week   Barriers to discharge Decreased caregiver support      Co-evaluation               End of Session   Activity Tolerance: Patient limited by pain Patient left: in bed;with call bell/phone within reach;with bed alarm set Nurse Communication: Mobility status ( a phased out period of a few seconds sitting up)    Functional Assessment Tool Used: clinical judgement Functional Limitation: Mobility: Walking and moving around Mobility: Walking and Moving Around Current Status [id number]): 100 percent impaired, limited or restricted Mobility: Walking and Moving Around Goal Status [id number]): At least 1 percent but less than 20 percent impaired, limited or restricted    Time:  0902-0938 PT Time Calculation (min) (ACUTE ONLY): 36 min   Charges:   PT Evaluation $Initial PT Evaluation Tier I: 1 Procedure PT Treatments $Therapeutic Activity: 8-22 mins   PT G Codes:   PT G-Codes **NOT FOR INPATIENT CLASS** Functional Assessment Tool Used: clinical judgement Functional Limitation: Mobility: Walking and moving around Mobility: Walking and Moving Around Current Status [id number]): 100 percent impaired, limited or restricted Mobility: Walking and Moving Around Goal Status [id number]): At least 1 percent but less than 20 percent impaired, limited or restricted    Claretha Cooper 07/03/2015, 11:41 AM Tresa Endo PT [phone]

## 2015-07-03 NOTE — Progress Notes (Signed)
Dr. Maryland Pink on floor and made aware of pt's frequent pauses and an episode of being "pkase out" per physical therapist during therapy this AM. MD said he would have cardiology to see pt. No new orders given. Vwilliams,rn.

## 2015-07-03 NOTE — Progress Notes (Signed)
PROGRESS NOTE  [name] [id number] DOB: 1946-05-17 DOA: 07/01/2015 PCP: Irven Shelling, MD  HPI/Recap of past 77 hours: 69 year old male past medical history of morbid obesity, chronic diastolic heart failure and diabetes mellitus was standing with his walker when his left knee gave out and he fell hitting his head. He did not lose consciousness. He presented to the emergency room on 10/21 unable to walk. He's had some intermittent episodes of chest pain over the last few weeks. The emergency room, x-ray of knee was unrevealing elevated no degenerative arthritis. Reportedly patient was told many years ago by his orthopedist and [place] that he would need to have that knee replaced, which she has avoided. Overnight from admission, patient had some episodes of chest pain, but EKG and troponins have all been negative and no events by telemetry. Patient also on admission noted to have a significant urinary tract infection and started on antibiotics.  Patient had an episode of chest pressure on the night of 10/21 & brief 1.76 second cause on telemetry. Initial troponins and EKG unrevealing. BNP checked and found to be mildly elevated and patient started on Lasix.  Today, more pauses and PT reported as an episode where patient was unresponsive for several seconds. This afternoon, more chest pressure. EKG notes a right bundle branch block and first-degree AV block.  Assessment/Plan: Active Problems: Chest pain: Enzymes cycled. Given persistence plus heart failure plus questionable episodic causes, will consult cardiology   Chronic diastolic CHF (congestive heart failure) ([place]): BNP checked and found to be mildly elevated at 350. Started on gentle IV Lasix   UTI (lower urinary tract infection): Continue IV Cipro   Type II diabetes mellitus with peripheral circulatory disorder, uncontrolled ([place]) with secondary diabetic neuropathy: Continue insulin and sliding scale. A1c 2 months ago at 9.   CBG still above 200, slightly increased NPH   Fall from standing   Left knee pain: No evidence of acute fracture. Unable to get MRI secondary to patient's weight. We'll discuss with orthopedic surgery, patient previously had seen an orthopedist at [place].  Physical therapy her recommending skilled nursing    Weakness generalized   Code Status: Full code  Family Communication: Left message with brother    Disposition: Anticipate discharge in several days once cardiac workup complete and orthopedics has weighed in    consultants: Orthopedic surgery    Ortho   Procedures:  None  Antibiotics:  IV Rocephin 10/21-present   Objective: BP 117/57 mmHg  Pulse 57  Temp(Src) 98.2 F (36.8 C) (Oral)  Resp 14  Ht 6\' 1"  (1.854 m)  Wt 163.75 kg (361 lb 0.1 oz)  BMI 47.64 kg/m2  SpO2 99%  Intake/Output Summary (Last 24 hours) at 07/03/15 1447 Last data filed at 07/03/15 1429  Gross per 24 hour  Intake    480 ml  Output  10050 ml  Net  -9570 ml   Filed Weights   07/02/15 0902 07/03/15 0525  Weight: 164.021 kg (361 lb 9.6 oz) 163.75 kg (361 lb 0.1 oz)    Exam:   General:  Alert and oriented 3, no acute distress, sleepier   Cardiovascular: Regular rate and rhythm, S1-S2  Respiratory: Clear to auscultation bilaterally  Abdomen: Soft, obese, nontender, positive bowel sounds  Musculoskeletal: Trace pitting edema bilaterally   Data Reviewed: Basic Metabolic Panel:  Recent Labs Lab 07/01/15 1348 07/02/15 0500 07/03/15 0620  NA 135 135 133*  K 3.9 3.6 3.5  CL 102 102 97*  CO2 27 28 28   GLUCOSE 298* 216* 241*  BUN 8 8 9   CREATININE 0.81 0.67 0.64  CALCIUM 8.4* 8.1* 8.3*   Liver Function Tests: No results for input(s): AST, ALT, ALKPHOS, BILITOT, PROT, ALBUMIN in the last 168 hours. No results for input(s): LIPASE, AMYLASE in the last 168 hours. No results for input(s): AMMONIA in the last 168 hours. CBC:  Recent Labs Lab 07/01/15 1348 07/01/15 2217  07/02/15 0500  WBC 8.6 9.1 8.4  NEUTROABS 6.0  --   --   HGB 12.8* 11.9* 12.8*  HCT 38.1* 36.0* 38.6*  MCV 88.2 88.5 89.8  PLT 192 181 202   Cardiac Enzymes:    Recent Labs Lab 07/01/15 1656 07/01/15 2217 07/02/15 0500 07/02/15 1050 07/02/15 1705  TROPONINI <0.03 <0.03 <0.03 <0.03 <0.03   BNP (last 3 results)  Recent Labs  09/14/14 0644 12/22/14 2330 07/02/15 1439  BNP 95.8 120.1* 320.3*    ProBNP (last 3 results)  Recent Labs  08/27/14 1548  PROBNP 1121.0*    CBG:  Recent Labs Lab 07/02/15 1124 07/02/15 1631 07/02/15 2153 07/03/15 0718 07/03/15 1146  GLUCAP 197* 186* 243* 208* 231*    Recent Results (from the past 240 hour(s))  Urine culture     Status: None (Preliminary result)   Collection Time: 07/01/15  2:55 PM  Result Value Ref Range Status   Specimen Description URINE, CATHETERIZED  Final   Special Requests NONE  Final   Culture   Final    >=100,000 COLONIES/mL KLEBSIELLA PNEUMONIAE Confirmed Extended Spectrum Beta-Lactamase Producer (ESBL) CULTURE REINCUBATED FOR BETTER GROWTH Performed at [facility]    Report Status PENDING  Incomplete   Organism ID, Bacteria KLEBSIELLA PNEUMONIAE  Final      Susceptibility   Klebsiella pneumoniae - MIC*    AMPICILLIN >=32 RESISTANT Resistant     CEFAZOLIN >=64 RESISTANT Resistant     CEFTRIAXONE >=64 RESISTANT Resistant     CIPROFLOXACIN >=4 RESISTANT Resistant     GENTAMICIN >=16 RESISTANT Resistant     IMIPENEM <=0.25 SENSITIVE Sensitive     NITROFURANTOIN 128 RESISTANT Resistant     TRIMETH/SULFA >=320 RESISTANT Resistant     AMPICILLIN/SULBACTAM >=32 RESISTANT Resistant     PIP/TAZO 16 SENSITIVE Sensitive     * >=100,000 COLONIES/mL KLEBSIELLA PNEUMONIAE  MRSA PCR Screening     Status: Abnormal   Collection Time: 07/01/15  9:39 PM  Result Value Ref Range Status   MRSA by PCR POSITIVE (A) NEGATIVE Final    Comment:        The GeneXpert MRSA Assay (FDA approved for NASAL  specimens only), is one component of a comprehensive MRSA colonization surveillance program. It is not intended to diagnose MRSA infection nor to guide or monitor treatment for MRSA infections. RESULT CALLED TO, READ BACK BY AND VERIFIED WITH: HAMILTON,P RN [phone] [id number] COVINGTON,N      Studies: No results found.  Scheduled Meds: . aspirin EC  81 mg Oral q morning - 10a  . Chlorhexidine Gluconate Cloth  6 each Topical Q0600  . ciprofloxacin  400 mg Intravenous Q12H  . enoxaparin (LOVENOX) injection  80 mg Subcutaneous Q24H  . ezetimibe  10 mg Oral Daily  . furosemide  20 mg Intravenous BID  . insulin aspart  0-9 Units Subcutaneous TID WC  . insulin NPH Human  18 Units Subcutaneous BID AC & HS  . isosorbide mononitrate  30 mg Oral Daily  . metoprolol  50 mg Oral BID  .  mupirocin ointment  1 application Nasal BID  . pantoprazole  40 mg Oral Daily  . sodium chloride  3 mL Intravenous Q12H  . sodium chloride  3 mL Intravenous Q12H    Continuous Infusions:    Time spent: 25 minutes  [place] [facility] Pager [phone]. If 7PM-7AM, please contact night-coverage at [email or web address], password [email or web address] 07/03/2015, 2:47 PM

## 2015-07-03 NOTE — Progress Notes (Signed)
Pt suddenly woke up from sleep holding onto to chest complaining of chest pressure  when nursing went into pt's room.  Pt reports chest pressure of 5/10. Vitals done.  144/71, 63, 99% ra, 16.  EKG  Done. Dr. Maryland Pink made aware. Order given for stat troponin. Done. Se result review for details. Vwilliams,rn.

## 2015-07-04 ENCOUNTER — Inpatient Hospital Stay (HOSPITAL_COMMUNITY): Payer: Medicare Other

## 2015-07-04 DIAGNOSIS — R0789 Other chest pain: Secondary | ICD-10-CM

## 2015-07-04 DIAGNOSIS — R079 Chest pain, unspecified: Secondary | ICD-10-CM

## 2015-07-04 LAB — GLUCOSE, CAPILLARY
GLUCOSE-CAPILLARY: 198 mg/dL — AB (ref 65–99)
GLUCOSE-CAPILLARY: 182 mg/dL — AB (ref 65–99)
Glucose-Capillary: 181 mg/dL — ABNORMAL HIGH (ref 65–99)
Glucose-Capillary: 216 mg/dL — ABNORMAL HIGH (ref 65–99)

## 2015-07-04 LAB — BASIC METABOLIC PANEL
Anion gap: 7 (ref 5–15)
BUN: 10 mg/dL (ref 6–20)
CALCIUM: 8.4 mg/dL — AB (ref 8.9–10.3)
CHLORIDE: 98 mmol/L — AB (ref 101–111)
CO2: 30 mmol/L (ref 22–32)
CREATININE: 0.66 mg/dL (ref 0.61–1.24)
GFR calc non Af Amer: >60 mL/min (ref >60)
GLUCOSE: 217 mg/dL — AB (ref 65–99)
Potassium: 3.8 mmol/L (ref 3.5–5.1)
Sodium: 135 mmol/L (ref 135–145)

## 2015-07-04 MED ORDER — SODIUM CHLORIDE 0.9 % IV SOLN
500.0000 mg | Freq: Four times a day (QID) | INTRAVENOUS | Status: DC
  Administered 2015-07-04 – 2015-07-05 (×4): 500 mg via INTRAVENOUS
  Filled 2015-07-04 (×4): qty 500

## 2015-07-04 MED ORDER — FUROSEMIDE 40 MG PO TABS
40.0000 mg | ORAL_TABLET | Freq: Every day | ORAL | Status: DC
  Administered 2015-07-05 – 2015-07-06 (×2): 40 mg via ORAL
  Filled 2015-07-04 (×2): qty 1

## 2015-07-04 MED ORDER — ROSUVASTATIN CALCIUM 20 MG PO TABS
20.0000 mg | ORAL_TABLET | Freq: Every day | ORAL | Status: DC
  Administered 2015-07-04 – 2015-07-05 (×2): 20 mg via ORAL
  Filled 2015-07-04 (×2): qty 1

## 2015-07-04 NOTE — Patient Outreach (Signed)
[facility]) Care Management  07/04/2015  [name] 07-Jul-1946 [id number]   Referral from [name], RN assigned [name], LCSW.  Thanks, Ronnell Freshwater. [place], [place] Assistant Phone: [phone] Fax: [phone]

## 2015-07-04 NOTE — Progress Notes (Signed)
Assumed care of patient at this time. Patient is stable with no complaints at this time. Agree with previously documented assessment. Will continue to monitor patient.   Deutsch, Adlai Nieblas 07/04/2015  

## 2015-07-04 NOTE — Consult Note (Signed)
   [facility] CM Inpatient Consult   07/04/2015  [name] [birth year] [id number]   EPIC [facility] Care Management referral received. Spoke with patient at bedside to explain [place] Management services again. He was active in the recent past. At that time it was thought he was staying at [facility] for long term. Patient endorses he can not walk and will likely need SNF again. He states he does not have Medicaid at this time. Consents signed again for [facility] follow up. He lives alone. He is active with [facility]. Explained to patient that [facility] will not interfere or replace services provided by home health or services provided by SNF. Confirmed his cell number as [phone]. Will make inpatient RNCM aware of conversation and will request for patient to be assigned.  [name], MSN-Ed, RN,BSN [facility] Liaison [phone]

## 2015-07-04 NOTE — Progress Notes (Signed)
  Echocardiogram 2D Echocardiogram has been performed.  [name] 07/04/2015, 12:58 PM

## 2015-07-04 NOTE — Consult Note (Signed)
WOC wound consult note Reason for Consult: Bilateral LE chronic ulcerations (healing). Followed by [facility] from [place]. Wound type:Venous insufficiency Pressure Ulcer POA: No Measurement:Right posterior heel ([place]) 1cm round x 0.2cm red, moist wound bed with scant drainage and no ordor.  Signs of contraction and healing evident.  Right pretibial region:  3cm x 1cm x 0.1cm clean, pink, granulating, scant exudate.  Left pretibial area: 1.8cm x 1cm x 0.2cm x distally, 0.5cm round x 0.2cm with scant light yellow exudate. Wound bed:As described above Drainage (amount, consistency, odor) As described above Periwound:intact, dry. Dressing procedure/placement/frequency: Currently the LEs are elevated and the heels are floated.  These two factors are essential to continued healing.  The left knee immobilizer will be a factor in continuing to elevate and float the heels, but patient is committed to continued healing. We will continue the use of soft silicone foam to provide a moisture retentive environment for wound healing. ACE bandages have not been required while here as LEs are elevated, but may be required if patient has periods of dependent positioning at home.  He is cautioned to minimize these and understands teaching and recommendations regarding fluid accumulation, swelling and wound healing. [place] nursing team will not follow, but will remain available to this patient, the nursing and medical teams.  Please re-consult if needed. Thanks, Maudie Flakes, MSN, RN, [place], [place], [place] [phone])

## 2015-07-04 NOTE — Progress Notes (Signed)
ANTIBIOTIC CONSULT NOTE - INITIAL  Pharmacy Consult for Primaxin Indication: ESBL UTI  Allergies  Allergen Reactions  . Ace Inhibitors Swelling    Pt tolerates lisinopril  . Lipitor [Atorvastatin Calcium] Swelling  . Metformin And Related Swelling  . Cefadroxil Hives  . Cephalexin Hives  . Morphine And Related Other (See Comments)    Sweating, feels like is "in rocky boat."  . Robaxin [Methocarbamol] Other (See Comments)    Feels like he is shaky    Patient Measurements: Height: 6\' 1"  (185.4 cm) Weight: (!) 358 lb (162.388 kg) IBW/kg (Calculated) : 79.9  Vital Signs: Temp: 98.5 F (36.9 C) (10/24 1526) Temp Source: Axillary (10/24 1526) BP: 108/58 mmHg (10/24 1526) Pulse Rate: 65 (10/24 1526) Intake/Output from previous day: 10/23 0701 - 10/24 0700 In: 840 [P.O.:840] Out: 6000 [Urine:6000] Intake/Output from this shift: Total I/O In: 483 [P.O.:480; I.V.:3] Out: 2700 [Urine:2700]  Labs:  Recent Labs  07/01/15 2217 07/02/15 0500 07/03/15 0620 07/04/15 1120  WBC 9.1 8.4  --   --   HGB 11.9* 12.8*  --   --   PLT 181 202  --   --   CREATININE  --  0.67 0.64 0.66   Estimated Creatinine Clearance: 143.1 mL/min (by C-G formula based on Cr of 0.66). No results for input(s): VANCOTROUGH, VANCOPEAK, VANCORANDOM, GENTTROUGH, GENTPEAK, GENTRANDOM, TOBRATROUGH, TOBRAPEAK, TOBRARND, AMIKACINPEAK, AMIKACINTROU, AMIKACIN in the last 72 hours.   Microbiology: Recent Results (from the past 720 hour(s))  Urine culture     Status: None (Preliminary result)   Collection Time: 07/01/15  2:55 PM  Result Value Ref Range Status   Specimen Description URINE, CATHETERIZED  Final   Special Requests NONE  Final   Culture   Final    >=100,000 COLONIES/mL KLEBSIELLA PNEUMONIAE Confirmed Extended Spectrum Beta-Lactamase Producer (ESBL) >=100,000 COLONIES/mL PSEUDOMONAS AERUGINOSA Performed at [facility]    Report Status PENDING  Incomplete   Organism ID, Bacteria  KLEBSIELLA PNEUMONIAE  Final      Susceptibility   Klebsiella pneumoniae - MIC*    AMPICILLIN >=32 RESISTANT Resistant     CEFAZOLIN >=64 RESISTANT Resistant     CEFTRIAXONE >=64 RESISTANT Resistant     CIPROFLOXACIN >=4 RESISTANT Resistant     GENTAMICIN >=16 RESISTANT Resistant     IMIPENEM <=0.25 SENSITIVE Sensitive     NITROFURANTOIN 128 RESISTANT Resistant     TRIMETH/SULFA >=320 RESISTANT Resistant     AMPICILLIN/SULBACTAM >=32 RESISTANT Resistant     PIP/TAZO 16 SENSITIVE Sensitive     * >=100,000 COLONIES/mL KLEBSIELLA PNEUMONIAE  MRSA PCR Screening     Status: Abnormal   Collection Time: 07/01/15  9:39 PM  Result Value Ref Range Status   MRSA by PCR POSITIVE (A) NEGATIVE Final    Comment:        The GeneXpert MRSA Assay (FDA approved for NASAL specimens only), is one component of a comprehensive MRSA colonization surveillance program. It is not intended to diagnose MRSA infection nor to guide or monitor treatment for MRSA infections. RESULT CALLED TO, READ BACK BY AND VERIFIED WITH: HAMILTON,P RN [phone] [id number] COVINGTON,N     Medical History: Past Medical History  Diagnosis Date  . Obese   . Hypertension   . Diabetic neuropathy, painful ([place])   . Chronic indwelling Foley catheter   . Bladder spasms   . Hyperlipidemia   . GERD (gastroesophageal reflux disease)   . Neurogenic bladder   . CAD (coronary artery disease)   . PONV (postoperative  nausea and vomiting)   . CHF (congestive heart failure) ([place])   . DVT (deep venous thrombosis) ([facility]) 1980's    LLE  . Type II diabetes mellitus ([place])   . Diabetic diarrhea ([place])   . Pneumonia 1999  . OSA (obstructive sleep apnea)     "they wanted me to wear a mask; I couldn't" (11/10/2014)  . Arthritis     "hands, ankles, knees" (11/10/2014)  . Chronic lower back pain   . Stroke ([place]) 10/2011; 06/2014    right hand numbness/notes 10/20/2011, pt not sure he had stroke in 2013; "right hand weaker and right face not quite right  since" (11/10/2014)    Medications:  Scheduled:  . aspirin EC  81 mg Oral q morning - 10a  . Chlorhexidine Gluconate Cloth  6 each Topical Q0600  . enoxaparin (LOVENOX) injection  80 mg Subcutaneous Q24H  . ezetimibe  10 mg Oral Daily  . furosemide  40 mg Oral Daily  . imipenem-cilastatin  500 mg Intravenous Q6H  . insulin aspart  0-9 Units Subcutaneous TID WC  . insulin NPH Human  18 Units Subcutaneous BID AC & HS  . isosorbide mononitrate  60 mg Oral Daily  . metoprolol  25 mg Oral BID  . mupirocin ointment  1 application Nasal BID  . pantoprazole  40 mg Oral Daily  . rosuvastatin  20 mg Oral q1800  . sodium chloride  3 mL Intravenous Q12H  . sodium chloride  3 mL Intravenous Q12H   Infusions:   Assessment:  69 yr male with h/o chronic foley catheter, CAD, HTN, DM admitted on 10/21 s/p fall and found to have UTI  Cipro 400mg  IV q12h started empirically and urine culture ordered  Urine culture resulted 10/23 = ESBL  Cipro d/c'ed and Fosfomycin started 10/23  Today, Pharmacy consulted to dose IV Primaxin and fosfomycin to be d/c'ed.  CrCl (n) = 110 ml/min  10/21 >>Cipro >>10/23 10/23 >>Fosfomycin >> 10/24 10/24 >>Primaxin >>   Goal of Therapy:  Eradication of infection  Plan:  Primaxin 500mg  IV q6h  Kobi Mario, Toribio Harbour, PharmD 07/04/2015,4:10 PM

## 2015-07-04 NOTE — Progress Notes (Signed)
Patient Name: [name] Date of Encounter: 07/04/2015     Principal Problem:   Chronic diastolic CHF (congestive heart failure) ([place]) Active Problems:   Diabetic neuropathy ([place])   Chest pain   UTI (lower urinary tract infection)   Type II diabetes mellitus with peripheral circulatory disorder, uncontrolled ([place])   Fall from standing   Left knee pain   Weakness generalized   Morbid obesity ([place])    SUBJECTIVE  He had some mild chest pressure last night, but overall chest pain much improved on increased imdur. No SOB. He is just sleepy. No orthopnea or PND  CURRENT MEDS . aspirin EC  81 mg Oral q morning - 10a  . Chlorhexidine Gluconate Cloth  6 each Topical Q0600  . enoxaparin (LOVENOX) injection  80 mg Subcutaneous Q24H  . ezetimibe  10 mg Oral Daily  . fosfomycin  3 g Oral QODAY  . furosemide  20 mg Intravenous BID  . insulin aspart  0-9 Units Subcutaneous TID WC  . insulin NPH Human  18 Units Subcutaneous BID AC & HS  . isosorbide mononitrate  60 mg Oral Daily  . metoprolol  25 mg Oral BID  . mupirocin ointment  1 application Nasal BID  . pantoprazole  40 mg Oral Daily  . sodium chloride  3 mL Intravenous Q12H  . sodium chloride  3 mL Intravenous Q12H    OBJECTIVE  Filed Vitals:   07/03/15 1338 07/03/15 1430 07/03/15 2246 07/04/15 0526  BP: 144/71 117/57 120/65 156/74  Pulse: 62 57 67 76  Temp:  98.2 F (36.8 C) 98.3 F (36.8 C) 97.6 F (36.4 C)  TempSrc:  Oral Oral Oral  Resp: 14  18 18   Height:      Weight:    358 lb (162.388 kg)  SpO2: 99% 99% 98% 95%    Intake/Output Summary (Last 24 hours) at 07/04/15 0834 Last data filed at 07/03/15 2244  Gross per 24 hour  Intake    840 ml  Output   6000 ml  Net  -5160 ml   Filed Weights   07/02/15 0902 07/03/15 0525 07/04/15 0526  Weight: 361 lb 9.6 oz (164.021 kg) 361 lb 0.1 oz (163.75 kg) 358 lb (162.388 kg)    PHYSICAL EXAM  General: Pleasant, NAD. Obese, appears lethargic Neuro: Alert  and oriented X 3. Moves all extremities spontaneously. Psych: Normal affect. HEENT:  Normal  Neck: Supple without bruits or JVD. Lungs:  Resp regular and unlabored, CTA. Heart: RRR no s3, s4, or murmurs. Abdomen: Soft, non-tender, non-distended, BS + x 4.  Extremities: No clubbing, cyanosis 1+ bilateral LE edema with bandages. DP/PT/Radials 2+ and equal bilaterally.  Accessory Clinical Findings  CBC  Recent Labs  07/01/15 1348 07/01/15 2217 07/02/15 0500  WBC 8.6 9.1 8.4  NEUTROABS 6.0  --   --   HGB 12.8* 11.9* 12.8*  HCT 38.1* 36.0* 38.6*  MCV 88.2 88.5 89.8  PLT 192 181 [id number]   Basic Metabolic Panel  Recent Labs  07/02/15 0500 07/03/15 0620  NA 135 133*  K 3.6 3.5  CL 102 97*  CO2 28 28  GLUCOSE 216* 241*  BUN 8 9  CREATININE 0.67 0.64  CALCIUM 8.1* 8.3*   Liver Function Tests No results for input(s): AST, ALT, ALKPHOS, BILITOT, PROT, ALBUMIN in the last 72 hours. No results for input(s): LIPASE, AMYLASE in the last 72 hours. Cardiac Enzymes  Recent Labs  07/02/15 1050 07/02/15 1705 07/03/15 1358  TROPONINI <  0.03 <0.03 <0.03    TELE  NSR with freq PVCs and missed beats ( Pauses up to 2 seconds long)  Radiology/Studies  Dg Tibia/fibula Left  07/01/2015  CLINICAL DATA:  Weakness with recent fall EXAM: LEFT TIBIA AND FIBULA - 2 VIEW COMPARISON:  None. FINDINGS: Degenerative changes of the knee and ankle joints are seen. No acute fracture or dislocation is noted. No soft tissue abnormality is seen. IMPRESSION: Degenerative change without acute abnormality. Electronically Signed   By: Inez Catalina M.D.   On: 07/01/2015 13:46   Ct Head Wo Contrast  07/01/2015  CLINICAL DATA:  Recent falls with increased lethargy EXAM: CT HEAD WITHOUT CONTRAST TECHNIQUE: Contiguous axial images were obtained from the base of the skull through the vertex without intravenous contrast. COMPARISON:  04/14/2015 FINDINGS: Bony calvarium is intact. No gross soft tissue abnormality  is noted. Mild atrophic changes are noted commenced with patient's given age. There are again calcifications within the basal ganglia and bowel my bilaterally. No findings to suggest acute hemorrhage, acute infarction or space-occupying mass lesion are noted. IMPRESSION: No acute abnormality noted. Electronically Signed   By: Inez Catalina M.D.   On: 07/01/2015 14:15   Dg Knee Complete 4 Views Left  07/01/2015  CLINICAL DATA:  Left knee gave out this morning.  No recent injury. EXAM: LEFT KNEE - COMPLETE 4+ VIEW COMPARISON:  None. FINDINGS: Exam demonstrates mild decreased bone mineralization. There are abundant overlying soft tissues. There are tricompartmental degenerative changes most prominent over the lateral compartment and patellofemoral joints. No evidence of fracture or dislocation. No significant joint effusion. IMPRESSION: No acute findings. 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 Electronically Signed   By: Marin Olp M.D.   On: 07/01/2015 13:46    11/15/2014 Echo Study Conclusions - Left ventricle: The cavity size was normal. There was moderate concentric hypertrophy. Systolic function was normal. The estimated ejection fraction was in the range of 50% to 55%. Wall motion was normal; there were no regional wall motion abnormalities. Doppler parameters are consistent with abnormal left ventricular relaxation (grade 1 diastolic dysfunction). - Aortic valve: Trileaflet; severely thickened, severely calcified leaflets. Valve mobility was restricted. There was mild stenosis. There was trivial regurgitation. Valve area (VTI): 1.09 cm^2. Valve area (Vmax): 1.14 cm^2. Valve area (Vmean): 1.06 cm^2. - Aortic root: The aortic root was normal in size. - Mitral valve: Calcified annulus. Mildly thickened leaflets . There was trivial regurgitation. - Left atrium: The atrium was severely dilated. - Right ventricle:  The cavity size was moderately dilated. Wall thickness was normal. Systolic function was normal. - Right atrium: The atrium was mildly dilated. - Tricuspid valve: There was mild regurgitation. - Pulmonary arteries: The main pulmonary artery was normal-sized. Systolic pressure was within the normal range. - Inferior vena cava: The vessel was normal in size. - Pericardium, extracardiac: There was no pericardial effusion. Impressions: - There is no significant change when compared to the study from 06/27/2014.    ASSESSMENT AND PLAN  [name] is a 69 y.o. male with a history of obesity, HTN, DM w/ neuropathy and neurogenic bladder, OSA intolerant to CPAP, walks with walker, HLD, mild aortic stenosis and CAD s/p RCA spiral dissection 04/2015 (were going to stent but patent after dissection) who was admitted to [facility] after a mechanical fall. He complained of chest pain during his admission and sinus pauses were noted on tele and cardiology was consulted.   CAD/Chest tightness: Recent catheterization  in 04/2015 demonstrated totally occluded PLA with catheter-indu ced spiral dissection of the proximal to mid RCA which ultimately sealed spontaneously and mild nonobstructive disease in the left coronary system. At the time, medical therapy was recommended. -- He has had recurrent episodes of chest tightness during this admission. ECG does not show acute ischemic changes and troponin negative x3.  -- He does have distal RCA occlusion which may be contributing to his recurrent chest tightness. Dr. Claiborne Billings increased imdur to 60mg  qd. This has helped his chest pain significantly. -- If he continues to have CP we could trial Ranexa 500mg  BID or amlodipine (but worry this could exacerbate LE edema). Only if he has accelerated sx would repeat cath be indicated per Dr. Claiborne Billings.   Sinus pauses ~1.6 sec on tele.  -- Metoprolol reduced from 50mg  BID to 25 mg BID. He still has pauses (up to 2 sec) on  tele today.  Mild aortic valve stenosis noted on March 2016 echo.  -- Repeat 2D ECHO pending  Diastolic heart failure with BNP 320. He is currently on Lasix IV 20mg  BID. Net neg 13.3L and weight down 3 lbs (361 --> 358 lbs). This can likely be converted back to PO lasix. I will resume his home dose of 40mg  po qd.  -- Repeat 2D ECHO pending as above.  Diabetes mellitus with neurogenic bladder and neuropathy  Hyperlipidemia; currently on Zetia 10 mg.  -- Intolerant to Lipitor (swelling). He has never tried another statin. WIll trial rosuvastatin 20mg   Sleep apnea- currently untreated secondary to difficulty with mask tolerability.  Klebsiella UTI- per IM  Dispo- PT recommending SNF  Signed, Eileen Stanford PA-C  Pager [phone]  Patient examined chart reviewed.  No left sided disease on angio reviewed PDA occlusion and dissection of RCA Continue higher does nitrates No need for rannexa  Ok to go to rehab facility.  Knee immobilizer per ortho  Would Not do any knee surgery for at least 3 months   Jenkins Rouge

## 2015-07-04 NOTE — Progress Notes (Addendum)
PROGRESS NOTE  [name] [id number] DOB: [birth year] DOA: 07/01/2015 PCP: Irven Shelling, MD  HPI/Recap of past 45 hours: 69 year old male past medical history of morbid obesity, chronic diastolic heart failure and diabetes mellitus was standing with his walker when his left knee gave out and he fell hitting his head. He did not lose consciousness. He presented to the emergency room on 10/21 unable to walk. He's had some intermittent episodes of chest pain over the last few weeks. The emergency room, x-ray of knee was unrevealing elevated no degenerative arthritis. Reportedly patient was told many years ago by [place] orthopedist and [place] that he would need to have that knee replaced, which she has avoided. Overnight from admission, patient had some episodes of chest pain, but EKG and troponins have all been negative and no events by telemetry. Patient also on admission noted to have a significant urinary tract infection and started on antibiotics.  Following admission, patient has had several episodes of brief few second pauses on telemetry, chest pressure with negative troponins. EKGs unrevealing. Seen by cardiology who reviewed patient's previous cath, we ordered echo which noted a grade 1 diastolic dysfunction and titrated down beta blocker. Urine cultures came back positive for resistant Klebsiella. Started on IV Primaxin    Assessment/Plan: Active Problems: Chest pain: Enzymes cycled. Seen by cardiology, felt to not be an acute ACS   Chronic diastolic CHF (congestive heart failure) ([place]): BNP checked and found to be mildly elevated at 350. Started on gentle IV Lasix   UTI (lower urinary tract infection): Was on IV Cipro, Klebsiella resistant. Now on Primaxin. We'll place PICC line and plan to continue IV antibiotics at skilled nursing    Type II diabetes mellitus with peripheral circulatory disorder, uncontrolled ([place]) with secondary diabetic neuropathy: Continue insulin and  sliding scale. A1c 2 months ago at 9.  CBG still above 200, slightly increased NPH   Fall from standing   Left knee pain: No evidence of acute fracture. Unable to get MRI secondary to patient's weightDiscussed with Dr. Tamera Punt, orthopedic surgery. He recommended knee immobilizer and short-term skilled nursing. He will follow up with patient in the office and at that time, knee injection versus potential knee replacement if needed.  Family Communication: Left message with brother    Disposition:     consultants: Orthopedic surgery    Ortho   Procedures:  None  Antibiotics:  IV Cipro 10/21-10/23    IV Primaxin 10/24-present   Objective: BP 156/74 mmHg  Pulse 76  Temp(Src) 97.6 F (36.4 C) (Oral)  Resp 18  Ht 6\' 1"  (1.854 m)  Wt 162.388 kg (358 lb)  BMI 47.24 kg/m2  SpO2 95%  Intake/Output Summary (Last 24 hours) at 07/04/15 1516 Last data filed at 07/04/15 1001  Gross per 24 hour  Intake    603 ml  Output   4150 ml  Net  -3547 ml   Filed Weights   07/02/15 0902 07/03/15 0525 07/04/15 0526  Weight: 164.021 kg (361 lb 9.6 oz) 163.75 kg (361 lb 0.1 oz) 162.388 kg (358 lb)    Exam:   General:  Alert and oriented 3, feeling much better  Cardiovascular: Regular rate and rhythm, S1-S2  Respiratory: Clear to auscultation bilaterally  Abdomen: Soft, obese, nontender, positive bowel sounds  Musculoskeletal: Trace pitting edema bilaterally   Data Reviewed: Basic Metabolic Panel:  Recent Labs Lab 07/01/15 1348 07/02/15 0500 07/03/15 0620 07/04/15 1120  NA 135 135 133* 135  K 3.9 3.6  3.5 3.8  CL 102 102 97* 98*  CO2 27 28 28 30   GLUCOSE 298* 216* 241* 217*  BUN 8 8 9 10   CREATININE 0.81 0.67 0.64 0.66  CALCIUM 8.4* 8.1* 8.3* 8.4*   Liver Function Tests: No results for input(s): AST, ALT, ALKPHOS, BILITOT, PROT, ALBUMIN in the last 168 hours. No results for input(s): LIPASE, AMYLASE in the last 168 hours. No results for input(s): AMMONIA in the last  168 hours. CBC:  Recent Labs Lab 07/01/15 1348 07/01/15 2217 07/02/15 0500  WBC 8.6 9.1 8.4  NEUTROABS 6.0  --   --   HGB 12.8* 11.9* 12.8*  HCT 38.1* 36.0* 38.6*  MCV 88.2 88.5 89.8  PLT 192 181 202   Cardiac Enzymes:    Recent Labs Lab 07/01/15 2217 07/02/15 0500 07/02/15 1050 07/02/15 1705 07/03/15 1358  TROPONINI <0.03 <0.03 <0.03 <0.03 <0.03   BNP (last 3 results)  Recent Labs  09/14/14 0644 12/22/14 2330 07/02/15 1439  BNP 95.8 120.1* 320.3*    ProBNP (last 3 results)  Recent Labs  08/27/14 1548  PROBNP 1121.0*    CBG:  Recent Labs Lab 07/03/15 1146 07/03/15 1623 07/03/15 2235 07/04/15 0737 07/04/15 1230  GLUCAP 231* 148* 207* 198* 181*    Recent Results (from the past 240 hour(s))  Urine culture     Status: None (Preliminary result)   Collection Time: 07/01/15  2:55 PM  Result Value Ref Range Status   Specimen Description URINE, CATHETERIZED  Final   Special Requests NONE  Final   Culture   Final    >=100,000 COLONIES/mL KLEBSIELLA PNEUMONIAE Confirmed Extended Spectrum Beta-Lactamase Producer (ESBL) >=100,000 COLONIES/mL PSEUDOMONAS AERUGINOSA Performed at [facility]    Report Status PENDING  Incomplete   Organism ID, Bacteria KLEBSIELLA PNEUMONIAE  Final      Susceptibility   Klebsiella pneumoniae - MIC*    AMPICILLIN >=32 RESISTANT Resistant     CEFAZOLIN >=64 RESISTANT Resistant     CEFTRIAXONE >=64 RESISTANT Resistant     CIPROFLOXACIN >=4 RESISTANT Resistant     GENTAMICIN >=16 RESISTANT Resistant     IMIPENEM <=0.25 SENSITIVE Sensitive     NITROFURANTOIN 128 RESISTANT Resistant     TRIMETH/SULFA >=320 RESISTANT Resistant     AMPICILLIN/SULBACTAM >=32 RESISTANT Resistant     PIP/TAZO 16 SENSITIVE Sensitive     * >=100,000 COLONIES/mL KLEBSIELLA PNEUMONIAE  MRSA PCR Screening     Status: Abnormal   Collection Time: 07/01/15  9:39 PM  Result Value Ref Range Status   MRSA by PCR POSITIVE (A) NEGATIVE Final     Comment:        The GeneXpert MRSA Assay (FDA approved for NASAL specimens only), is one component of a comprehensive MRSA colonization surveillance program. It is not intended to diagnose MRSA infection nor to guide or monitor treatment for MRSA infections. RESULT CALLED TO, READ BACK BY AND VERIFIED WITH: HAMILTON,P RN [phone] [id number] C[place],N      Studies: No results found.  Scheduled Meds: . aspirin EC  81 mg Oral q morning - 10a  . Chlorhexidine Gluconate Cloth  6 each Topical Q0600  . enoxaparin (LOVENOX) injection  80 mg Subcutaneous Q24H  . ezetimibe  10 mg Oral Daily  . fosfomycin  3 g Oral QODAY  . furosemide  40 mg Oral Daily  . insulin aspart  0-9 Units Subcutaneous TID WC  . insulin NPH Human  18 Units Subcutaneous BID AC & HS  . isosorbide mononitrate  60 mg Oral Daily  . metoprolol  25 mg Oral BID  . mupirocin ointment  1 application Nasal BID  . pantoprazole  40 mg Oral Daily  . rosuvastatin  20 mg Oral q1800  . sodium chloride  3 mL Intravenous Q12H  . sodium chloride  3 mL Intravenous Q12H    Continuous Infusions:    Time spent: 25 minutes  [facility] Pager [phone]. If 7PM-7AM, please contact night-coverage at [email or web address], password [email or web address] 07/04/2015, 3:16 PM  LOS: 1 day

## 2015-07-05 ENCOUNTER — Other Ambulatory Visit: Payer: Self-pay | Admitting: *Deleted

## 2015-07-05 ENCOUNTER — Encounter: Payer: Self-pay | Admitting: *Deleted

## 2015-07-05 DIAGNOSIS — E1151 Type 2 diabetes mellitus with diabetic peripheral angiopathy without gangrene: Secondary | ICD-10-CM

## 2015-07-05 DIAGNOSIS — E1165 Type 2 diabetes mellitus with hyperglycemia: Secondary | ICD-10-CM

## 2015-07-05 DIAGNOSIS — N390 Urinary tract infection, site not specified: Secondary | ICD-10-CM

## 2015-07-05 LAB — GLUCOSE, CAPILLARY
Glucose-Capillary: 184 mg/dL — ABNORMAL HIGH (ref 65–99)
Glucose-Capillary: 228 mg/dL — ABNORMAL HIGH (ref 65–99)
Glucose-Capillary: 209 mg/dL — ABNORMAL HIGH (ref 65–99)
Glucose-Capillary: 192 mg/dL — ABNORMAL HIGH (ref 65–99)
Glucose-Capillary: 222 mg/dL — ABNORMAL HIGH (ref 65–99)

## 2015-07-05 LAB — URINE CULTURE: Culture: >=100000

## 2015-07-05 LAB — TROPONIN I: Troponin I: <0.03 ng/mL (ref <0.031)

## 2015-07-05 MED ORDER — FOSFOMYCIN TROMETHAMINE 3 G PO PACK
3.0000 g | PACK | ORAL | Status: DC
  Administered 2015-07-05: 3 g via ORAL
  Filled 2015-07-05: qty 3

## 2015-07-05 MED ORDER — GI COCKTAIL ~~LOC~~
30.0000 mL | Freq: Once | ORAL | Status: AC
  Administered 2015-07-05: 30 mL via ORAL
  Filled 2015-07-05: qty 30

## 2015-07-05 NOTE — Patient Outreach (Signed)
[name]  07/05/2015  [name] [birth year] [id number]   [name] received a new referral on patient from [facility] Liaison with [facility].  According to Mrs. [name], patient will be placed into a skilled nursing facility, for short-term rehabilitative services, upon discharge from [name].  However, patient is reluctant to accept placement, as he was recently released from [name] for skilled care on September 27th.  [name] requested that [name] follow patient while in the skilled facility to assess and assist with possible discharge planning needs and services versus long-term care placement arrangements. [name] was able to confirm with the [name], [name] that patient continues to reside at [name], as patient is not currently medically stable and ready for discharge.  [name] reports that patient was living at home alone, prior to admission into [name].  According to patient's EMR (Electronic Medical Record), patient presented to the emergency department with increased weakness as a result of a recent fall.  Patient was receiving home health services through [name], which included all of the following:  [place], physical therapy, occupational therapy and an aide.  Nursing care consisted of wound care and dressing changes and an aide that comes out twice per week to administer a bath.  Patient admits to having no support or supervision in the home, but unwilling to consider long-term care placement arrangements, at this time.  Patient has been provided a list of home health agencies in [facility], as well as a list of private agency sitters, understanding that this will be an out-of-pocket expense for him.  [name] will continue to follow along.  [name] will fax a barriers letter and correspondence letter to patient's Primary  Care Physician, [name].  [name], BSW, MSW, LCSW  Licensed Education officer, [facility]  Mailing [place] [address] [place], [place] [address] Physical Address-[address], [place], [place] [address] Toll Free Main # [phone] Fax # [phone] Cell # [phone]  Fax # [phone]  [name]

## 2015-07-05 NOTE — Progress Notes (Signed)
Patient Name: [name] Date of Encounter: 07/05/2015  Principal Problem:   Chronic diastolic CHF (congestive heart failure) ([place]) Active Problems:   Diabetic neuropathy ([place])   Chest pain   UTI (lower urinary tract infection)   Type II diabetes mellitus with peripheral circulatory disorder, uncontrolled ([place])   Fall from standing   Left knee pain   Weakness generalized   Morbid obesity [name])   Primary Cardiologist: Dr. Claiborne Billings Patient Profile: 69 y.o. male w/ PMH of obesity, HTN, DM w/ neuropathy and neurogenic bladder, OSA (intolerant to CPAP), HLD, mild aortic stenosis and CAD s/p RCA spiral dissection 04/2015 (were going to stent but patent after dissection) who was admitted to [facility] after a mechanical fall. He complained of chest pain during his admission and sinus pauses were noted on tele and cardiology was consulted.  SUBJECTIVE: Still having mild chest pain but says it has significantly improved since he came in. Reports his breathing has improved as well.  OBJECTIVE Filed Vitals:   07/04/15 0526 07/04/15 1526 07/04/15 2105 07/05/15 0502  BP: 156/74 108/58 124/64 147/66  Pulse: 76 65 72 70  Temp: 97.6 F (36.4 C) 98.5 F (36.9 C) 98.2 F (36.8 C) 97.8 F (36.6 C)  TempSrc: Oral Axillary Oral Oral  Resp: 18 20 20 18   Height:      Weight: 358 lb (162.388 kg)   355 lb 3.2 oz (161.118 kg)  SpO2: 95% 96% 95% 95%    Intake/Output Summary (Last 24 hours) at 07/05/15 0923 Last data filed at 07/05/15 0700  Gross per 24 hour  Intake    920 ml  Output   4800 ml  Net  -3880 ml   Filed Weights   07/03/15 0525 07/04/15 0526 07/05/15 0502  Weight: 361 lb 0.1 oz (163.75 kg) 358 lb (162.388 kg) 355 lb 3.2 oz (161.118 kg)    PHYSICAL EXAM General: Well developed, well nourished, male in no acute distress. Head: Normocephalic, atraumatic.  Neck: Supple without bruits, JVD not elevated. Lungs:  Resp regular and unlabored, CTA without wheezing or rales. Heart: RRR,  S1, S2, no S3, S4, or murmur; no rub. Abdomen: Soft, non-tender, non-distended with normoactive bowel sounds. No hepatomegaly. No rebound/guarding. No obvious abdominal masses. Extremities: No clubbing or cyanosis, trace edema. Distal pedal pulses are 2+ bilaterally. Neuro: Alert and oriented X 3. Moves all extremities spontaneously. Psych: Normal affect.   LABS: Basic Metabolic Panel: Recent Labs  07/03/15 0620 07/04/15 1120  NA 133* 135  K 3.5 3.8  CL 97* 98*  CO2 28 30  GLUCOSE 241* 217*  BUN 9 10  CREATININE 0.64 0.66  CALCIUM 8.3* 8.4*   Cardiac Enzymes: Recent Labs  07/02/15 1050 07/02/15 1705 07/03/15 1358  TROPONINI <0.03 <0.03 <0.03   BNP:  B NATRIURETIC PEPTIDE  Date/Time Value Ref Range Status  07/02/2015 02:39 PM 320.3* 0.0 - 100.0 pg/mL Final  12/22/2014 11:30 PM 120.1* 0.0 - 100.0 pg/mL Final   TELE:  NSR with rate in 60's -70's. Pauses up to 1.8 seconds present. Frequent PVC's.      ECHO: 07/04/2015 Study Conclusions - Left ventricle: The cavity size was normal. There was mild concentric hypertrophy. Systolic function was normal. The estimated ejection fraction was in the range of 55% to 60%. Images were inadequate for LV wall motion assessment. There was an increased relative contribution of atrial contraction to ventricular filling. Doppler parameters are consistent with abnormal left ventricular relaxation (grade 1 diastolic dysfunction). - Aortic  valve: Severe diffuse thickening and calcification. Valve mobility was restricted. There was mild stenosis. Valve area (VTI): 1.73 cm^2. Valve area (Vmax): 1.54 cm^2. Valve area (Vmean): 1.53 cm^2. - Mitral valve: Calcified annulus. There was trivial regurgitation. - Left atrium: The atrium was severely dilated.  Current Medications:  . aspirin EC  81 mg Oral q morning - 10a  . Chlorhexidine Gluconate Cloth  6 each Topical Q0600  . enoxaparin (LOVENOX) injection  80 mg  Subcutaneous Q24H  . ezetimibe  10 mg Oral Daily  . furosemide  40 mg Oral Daily  . imipenem-cilastatin  500 mg Intravenous Q6H  . insulin aspart  0-9 Units Subcutaneous TID WC  . insulin NPH Human  18 Units Subcutaneous BID AC & HS  . isosorbide mononitrate  60 mg Oral Daily  . metoprolol  25 mg Oral BID  . mupirocin ointment  1 application Nasal BID  . pantoprazole  40 mg Oral Daily  . rosuvastatin  20 mg Oral q1800  . sodium chloride  3 mL Intravenous Q12H  . sodium chloride  3 mL Intravenous Q12H      ASSESSMENT AND PLAN:  1. CAD/Chest tightness:  - Recent catheterization in 04/2015 demonstrated totally occluded PLA with catheter-indu ced spiral dissection of the proximal to mid RCA which ultimately sealed spontaneously and mild nonobstructive disease in the left coronary system. At the time, medical therapy was recommended. - He has had recurrent episodes of chest tightness during this admission. ECG does not show acute ischemic changes and troponin negative x3.  - He does have distal RCA occlusion which may be contributing to his recurrent chest tightness. Dr. Claiborne Billings increased Imdur to 60mg  daily. This has helped his chest pain significantly. - continue BB, statin, Zetia, ASA, and Imdur.   2. Sinus pauses  -- Metoprolol reduced from 50mg  BID to 25 mg BID.   3. Mild aortic valve stenosis  - noted on March 2016 echo.  - repeat Echo on 07/04/2015 shows aortic valve with severe diffuse thickening and calcification. Valve mobility was restricted. There was mild stenosis. Valve area (VTI): 1.73 cm^2. Valve area (Vmax): 1.54 cm^2. Valve area (Vmean): 1.53 cm^2.  4. Chronic Diastolic CHF - with BNP [id number]. He is currently on Lasix IV 20mg  BID. Net neg 17.2L and weight down 6 lbs (361 --> 355 lbs).  -  Echo on 07/04/2015 showed EF of 55% to 60% and Grade 1 diastolic dysfunction. Aortic valve with severe diffuse thickening and calcification. Valve mobility was restricted. There was mild  stenosis. Valve area (VTI): 1.73 cm^2. Valve area (Vmax): 1.54 cm^2. Valve area (Vmean): 1.53 cm^2. - converted to PO Lasix 40mg  daily.   5. Diabetes mellitus  - with neurogenic bladder and neuropathy - per admitting team  6. Hyperlipidemia; currently on Zetia 10 mg.  -- Intolerant to Lipitor (swelling). He has never tried another statin. WIll trial rosuvastatin 20mg   7. Sleep apnea - currently untreated secondary to difficulty with mask tolerability.  8. Klebsiella UTI - per admitting team  Cardiology Follow-Up has been scheduled on 07/19/2015 with Nell Range, PA-C.   Arna Medici , PA-C 9:23 AM 07/05/2015 Pager: [phone]  Patient examined chart reviewed.  Pain free r/o no need for relook cath [facility] to go to rehab.  Has brace on left leg and tegra derm on RLE.  Obese And deconditioned.  Will sign off   Jenkins [place]

## 2015-07-05 NOTE — Progress Notes (Signed)
Inpatient Diabetes Program Recommendations  AACE/ADA: New Consensus Statement on Inpatient Glycemic Control (2015)  Target Ranges:  Prepandial:   less than 140 mg/dL      Peak postprandial:   less than 180 mg/dL (1-2 hours)      Critically ill patients:  140 - 180 mg/dL   Review of Glycemic Control  Results for DEMAREON, RUANE (MRN [id number]) as of 07/05/2015 10:42  Ref. Range 07/04/2015 07:37 07/04/2015 12:30 07/04/2015 16:39 07/04/2015 21:04 07/05/2015 07:33  Glucose-Capillary Latest Ref Range: 65-99 mg/dL 198 (H) 181 (H) 182 (H) 216 (H) 184 (H)  Results for OSAZE, KLEINFELDT (MRN [id number]) as of 07/05/2015 10:42  Ref. Range 07/02/2015 05:00 07/03/2015 06:20 07/04/2015 11:20  Glucose Latest Ref Range: 65-99 mg/dL 216 (H) 241 (H) 217 (H)    Inpatient Diabetes Program Recommendations:     Increase NPH to 20 units bid. If CBGs run > 180 mg/dL, increase Novolog to moderate tidwc.  Will continue to follow. Thank you. Lorenda Peck, RD, LDN, CDE Inpatient Diabetes Coordinator [phone]

## 2015-07-05 NOTE — NC FL2 (Signed)
[place] LEVEL OF CARE SCREENING TOOL     IDENTIFICATION  Patient Name: [name] Birthdate: 10/16/1945 Sex: male Admission Date (Current Location): 07/01/2015  [facility] [place] Number:    Facility and Address:      Provider Number: [id number]   Attending Physician Name and Address:  Hosie Poisson, MD  Relative Name and Address:       Current Level of Care: Hospital Recommended Level of Care: Nursing Facility Prior Approval Number:    Date Approved/Denied:   PASRR Number: [id number]  Discharge Plan: SNF    Current Diagnoses: Patient Active Problem List   Diagnosis Date Noted  . Morbid obesity ([place]) 07/03/2015  . Fall from standing 07/01/2015  . Left knee pain 07/01/2015  . Weakness generalized 07/01/2015  . Type II diabetes mellitus with peripheral circulatory disorder, uncontrolled ([place]) 05/09/2015  . Unstable angina ([place]) 05/06/2015  . Hyperlipidemia LDL goal <70 05/02/2015  . Abnormal nuclear stress test 05/02/2015  . Neurogenic bladder 04/26/2015  . Pressure ulcer 04/15/2015  . Mental confusion 04/14/2015  . UTI (lower urinary tract infection) 04/14/2015  . History of DVT (deep vein thrombosis) 12/08/2014  . Right-sided abdominal pain of unknown cause   . Unresponsive episode- suspected abscence seizures 11/15/2014  . Diarrhea 11/15/2014  . Chest pain at rest 11/15/2014  . Faintness   . Diabetes type 2, uncontrolled ([place])   . Abnormal urinalysis 11/10/2014  . Chest pain 11/10/2014  . Axillary nodule 11/10/2014  . GERD (gastroesophageal reflux disease) 09/06/2014  . Syncope 04/01/2014  . Chronic diastolic CHF (congestive heart failure) ([place]) 04/01/2014  . Physical deconditioning 12/25/2013  . Chest pain with moderate risk of acute coronary syndrome 12/23/2013  . Chronic indwelling Foley catheter 12/23/2013  . Gait abnormality 03/30/2012  . CVA (cerebral infarction) 03/11/2012  . Obstipation 03/11/2012  . CAD- known RCA  disease- medical Rx 11/15/2011  . NSVT (nonsustained ventricular tachycardia) ([place]) 10/26/2011  . Diabetic neuropathy ([place]) 10/10/2011  . OSA (obstructive sleep apnea) 10/10/2011  . Generalized headaches 10/10/2011  . Hypertension 10/10/2011    DISORIENTED AMBULATORY STATUS BLADDER BOWEL  Constantly Semi-Ambulatory (+2 assistance needed, pt has knee immobilizer on L knee) Indwelling catheter Incontinent  INAPPROPRIATE BEHAVIOR FUNCTIONAL LIMITATIONS COMMUNICATION OF NEEDS RESPIRATION    Sight Verbally Normal  PERSONAL CARE ASSISTANCE ACTIVITIES/SOCIAL SKIN NUTRITION STATUS  Bathing, Dressing   Other (Comment) (Diabetic ulcer right 2nd and 3rd toe, dry;red no dressing; Bilateral LE chronic ulcerations (healing). See Wound Care note.) Diet, Weight, Height (Carb Modified, height 72 inches, weight 355 lbs)  PHYSICIAN VISITS NEUROLOGICAL            SPECIAL CARE FACTORS FREQUENCY  PT (By licensed PT)     PT Frequency: 5x week             Current Medications (07/05/2015): Current Facility-Administered Medications  Medication Dose Route Frequency Provider Last Rate Last Dose  . 0.9 %  sodium chloride infusion  250 mL Intravenous PRN Belkys A Regalado, MD      . acetaminophen (TYLENOL) tablet 650 mg  650 mg Oral Q6H PRN Belkys A Regalado, MD       Or  . acetaminophen (TYLENOL) suppository 650 mg  650 mg Rectal Q6H PRN Belkys A Regalado, MD      . ALPRAZolam Duanne Moron) tablet 0.25 mg  0.25 mg Oral TID PRN Annita Brod, MD   0.25 mg at 07/05/15 0030  . aspirin EC tablet 81 mg  81 mg Oral  q morning - 10a Belkys A Regalado, MD   81 mg at 07/05/15 0937  . Chlorhexidine Gluconate Cloth 2 % PADS 6 each  6 each Topical Q0600 Belkys A Regalado, MD   6 each at 07/04/15 0600  . enoxaparin (LOVENOX) injection 80 mg  80 mg Subcutaneous Q24H Minda Ditto, RPH   80 mg at 07/04/15 2208  . ezetimibe (ZETIA) tablet 10 mg  10 mg Oral Daily Belkys A Regalado, MD   10 mg at 07/05/15 0937  . fentaNYL  (SUBLIMAZE) injection 12.5 mcg  12.5 mcg Intravenous Q3H PRN Belkys A Regalado, MD   12.5 mcg at 07/03/15 1618  . furosemide (LASIX) tablet 40 mg  40 mg Oral Daily Eileen Stanford, PA-C   40 mg at 07/05/15 [id number]  . HYDROcodone-acetaminophen (NORCO/VICODIN) 5-325 MG per tablet 1 tablet  1 tablet Oral Q6H PRN Elmarie Shiley, MD   1 tablet at 07/05/15 0848  . imipenem-cilastatin (PRIMAXIN) 500 mg in sodium chloride 0.9 % 100 mL IVPB  500 mg Intravenous Q6H Leann T Poindexter, RPH   500 mg at 07/05/15 0446  . insulin aspart (novoLOG) injection 0-9 Units  0-9 Units Subcutaneous TID WC Elmarie Shiley, MD   2 Units at 07/05/15 0811  . insulin NPH Human (HUMULIN N,NOVOLIN N) injection 18 Units  18 Units Subcutaneous BID AC & HS Annita Brod, MD   18 Units at 07/05/15 [id number]  . isosorbide mononitrate (IMDUR) 24 hr tablet 60 mg  60 mg Oral Daily Troy Sine, MD   60 mg at 07/05/15 [id number]  . metoprolol tartrate (LOPRESSOR) tablet 25 mg  25 mg Oral BID Troy Sine, MD   25 mg at 07/05/15 [id number]  . mupirocin ointment (BACTROBAN) 2 % 1 application  1 application Nasal BID Belkys A Regalado, MD   1 application at [id number]  . nitroGLYCERIN (NITROSTAT) SL tablet 0.4 mg  0.4 mg Sublingual Q5 min PRN Belkys A Regalado, MD   0.4 mg at 07/02/15 0330  . ondansetron (ZOFRAN) tablet 4 mg  4 mg Oral Q6H PRN Belkys A Regalado, MD       Or  . ondansetron (ZOFRAN) injection 4 mg  4 mg Intravenous Q6H PRN Belkys A Regalado, MD      . oxybutynin (DITROPAN) tablet 5 mg  5 mg Oral Daily PRN Belkys A Regalado, MD      . pantoprazole (PROTONIX) EC tablet 40 mg  40 mg Oral Daily Belkys A Regalado, MD   40 mg at 07/05/15 0937  . rosuvastatin (CRESTOR) tablet 20 mg  20 mg Oral q1800 Eileen Stanford, PA-C   20 mg at 07/04/15 1723  . senna-docusate (Senokot-S) tablet 2 tablet  2 tablet Oral BID PRN Belkys A Regalado, MD      . sodium chloride 0.9 % injection 3 mL  3 mL Intravenous Q12H Belkys A Regalado, MD   3 mL at  07/03/15 2306  . sodium chloride 0.9 % injection 3 mL  3 mL Intravenous Q12H Belkys A Regalado, MD   3 mL at 07/04/15 0916  . sodium chloride 0.9 % injection 3 mL  3 mL Intravenous PRN Belkys A Regalado, MD       Do not use this list as official medication orders. Please verify with discharge summary.  Discharge Medications:   Medication List    ASK your doctor about these medications        acetaminophen 325 MG tablet  Commonly  known as:  TYLENOL  Take 2 tablets (650 mg total) by mouth every 4 (four) hours as needed for headache or mild pain.     aspirin EC 81 MG tablet  Take 81 mg by mouth every morning.     calcium carbonate 500 MG chewable tablet  Commonly known as:  TUMS - dosed in mg elemental calcium  Chew 1 tablet (200 mg of elemental calcium total) by mouth 3 (three) times daily as needed for indigestion or heartburn.     ciprofloxacin 500 MG tablet  Commonly known as:  CIPRO  Take 1 tablet (500 mg total) by mouth every 12 (twelve) hours.     diphenoxylate-atropine 2.5-0.025 MG tablet  Commonly known as:  LOMOTIL  Take 1 tablet by mouth 3 (three) times daily as needed. For diarrhea     ezetimibe 10 MG tablet  Commonly known as:  ZETIA  Take 1 tablet (10 mg total) by mouth daily.     fexofenadine 180 MG tablet  Commonly known as:  ALLEGRA  Take 180 mg by mouth daily as needed for allergies or rhinitis.     furosemide 40 MG tablet  Commonly known as:  LASIX  Take 1 tablet (40 mg total) by mouth daily.     HYDROcodone-acetaminophen 5-325 MG tablet  Commonly known as:  NORCO/VICODIN  Take 1 tablet by mouth every 6 (six) hours as needed for severe pain.     insulin NPH Human 100 UNIT/ML injection  Commonly known as:  HUMULIN N  Inject 0.2 mLs (20 Units total) into the skin 2 (two) times daily at 8 am and 10 pm.     isosorbide mononitrate 30 MG 24 hr tablet  Commonly known as:  IMDUR  Take 1 tablet (30 mg total) by mouth daily.     lisinopril 5 MG tablet   Commonly known as:  PRINIVIL,ZESTRIL  Take 5 mg by mouth daily.     losartan 25 MG tablet  Commonly known as:  COZAAR  Take 1 tablet (25 mg total) by mouth daily.     metoprolol 50 MG tablet  Commonly known as:  LOPRESSOR  Take 1 tablet (50 mg total) by mouth 2 (two) times daily.     nitrofurantoin (macrocrystal-monohydrate) 100 MG capsule  Commonly known as:  MACROBID  Take 1 capsule (100 mg total) by mouth 2 (two) times daily.     nitroGLYCERIN 0.4 MG SL tablet  Commonly known as:  NITROSTAT  Place 1 tablet (0.4 mg total) under the tongue every 5 (five) minutes as needed for chest pain.     omeprazole 20 MG capsule  Commonly known as:  PRILOSEC  Take 20 mg by mouth daily.     oxybutynin 5 MG tablet  Commonly known as:  DITROPAN  Take 5 mg by mouth daily as needed for bladder spasms.     senna-docusate 8.6-50 MG tablet  Commonly known as:  Senokot-S  Take 2 tablets by mouth daily.        Relevant Imaging Results:  Relevant Lab Results:  Recent Labs    Additional Information FULL code status. Allergies: Ace Inhibitors, Lipitor, Metformin And Related, Cefadroxil, Cephalexin, Morphine And Related, Robaxin. Contact precautions: Urine culture 07/01/15 Klebsiella Pneumoniae confirmed ESBL. Resistant to multiple classes. MDRO. Requires contact precautions with each admission. Pt will have PICC line for IV antibiotics at SNF.    Cresencia Asmus A, LCSW

## 2015-07-05 NOTE — Care Management Important Message (Signed)
Important Message  Patient Details IM Letter given to Cookie/Case Manager to present to [place] Message  Patient Details  Name: MYSHAUN HOLDT MRN: [id number] Date of Birth: 1946-06-22   Medicare Important Message Given:  Yes-second notification given    Camillo Flaming 07/05/2015, 12:09 PM Name: ADELL ESHBACH MRN: [id number] Date of Birth: 1946-04-13   Medicare Important Message Given:  Yes-second notification given    Camillo Flaming 07/05/2015, 12:08 PM

## 2015-07-05 NOTE — Progress Notes (Signed)
Report received from Berniece Salines, RN. I agree with previous RN's assessment. Will continue to monitor pt closely. Carnella [place] I

## 2015-07-05 NOTE — Progress Notes (Signed)
BSW Intern received referral for new SNF. PT recommending pt for SNF and RN reports pt has PICC Line and will need IV antibiotics at SNF.   BSW Intern met with pt at bedside. Introduced self and explained role. Pt reports he is admitted from home where he lives alone and had home health services 2 times per week. BSW Intern discussed recommendation for short-term rehab for PT and IV antibiotics. Pt agreeable to [facility] search. Pt reports he has been to [facility] in the past and does not want to go back to that facility but open to other SNFs in [facility]. Pt expressed interest in wanting to obtain an electric wheelchair and [name] explained pt needs to discuss this with the SNF he goes to and facility can assist pt with this request. Pt requests private room at SNF.  BSW Intern completed FL2 and initiated SNf search to [facility]. BSW Intern to follow-up with bed offers.   BSW Intern to continue to follow to provide support and assist with dc planning needs. Harlon Flor, [place] Intern Clinical Social Work Department  [phone]

## 2015-07-05 NOTE — Clinical Social Work Placement (Signed)
   CLINICAL SOCIAL WORK PLACEMENT  NOTE  Date:  07/05/2015  Patient Details  Name: [name] MRN: [id number] Date of Birth: 05/27/1946  Clinical Social Work is seeking post-discharge placement for this patient at the [place] level of care (*[name] will initial, date and re-position this form in  chart as items are completed):  Yes   Patient/family provided with [place] Work Department's list of facilities offering this level of care within the geographic area requested by the patient (or if unable, by the patient's family).  Yes   Patient/family informed of their freedom to choose among providers that offer the needed level of care, that participate in Medicare, Medicaid or managed care program needed by the patient, have an available bed and are willing to accept the patient.  Yes   Patient/family informed of [place]'s ownership interest in [facility] and [facility], as well as of the fact that they are under no obligation to receive care at these facilities.  PASRR submitted to EDS on       PASRR number received on       Existing PASRR number confirmed on 07/05/15     FL2 transmitted to all facilities in geographic area requested by pt/family on 07/05/15     FL2 transmitted to all facilities within larger geographic area on       Patient informed that his/her managed care company has contracts with or will negotiate with certain facilities, including the following:            Patient/family informed of bed offers received.  Patient chooses bed at       Physician recommends and patient chooses bed at      Patient to be transferred to   on  .  Patient to be transferred to facility by       Patient family notified on   of transfer.  Name of family member notified:        PHYSICIAN Please sign FL2     Additional Comment:    _______________________________________________ Harlon Flor, Student-SW 07/05/2015,  12:36 PM

## 2015-07-05 NOTE — Progress Notes (Signed)
PROGRESS NOTE  [name] [id number] DOB: 04-11-1946 DOA: 07/01/2015 PCP: Irven Shelling, MD  HPI/Recap of past 11 hours: 69 year old male past medical history of morbid obesity, chronic diastolic heart failure and diabetes mellitus was standing with his walker when his left knee gave out and he fell hitting his head. He did not lose consciousness. He presented to the emergency room on 10/21 unable to walk. He's had some intermittent episodes of chest pain over the last few weeks. The emergency room, x-ray of knee was unrevealing elevated no degenerative arthritis. Reportedly patient was told many years ago by [place]opedist [place] that he would need to have that knee replaced, which she has avoided. Overnight from admission, patient had some episodes of chest pain, but EKG and troponins have all been negative and no events by telemetry. Patient also on admission noted to have a significant urinary tract infection and started on antibiotics.  Following admission, patient has had several episodes of brief few second pauses on telemetry, chest pressure with negative troponins. EKGs unrevealing. Seen by cardiology who reviewed patient's previous cath, we ordered echo which noted a grade 1 diastolic dysfunction and titrated down beta blocker. Urine cultures came back positive for resistant Klebsiella and pseudomonas and  Started on IV Primaxin initially, but unfortunately the pseudomonas is resistent to imipenams, so changed to fosfomycin that works on both klebsiella and pseudomonas.     Assessment/Plan: Active Problems: Chest pain: Enzymes cycled. Seen by cardiology, felt to not be an acute ACS. Recurrent chest pain today after the PICC line was placed as per the patient.  Observe overnight and cycle cardiac enzymes.  EKG unremarkable, troponin is negative.  Gi coktail and pain meds. Picc team to evaluate and remove the PICC line if possible, as we are not going to use it  anyway.     Chronic diastolic CHF (congestive heart failure) ([place]): BNP checked and found to be mildly elevated at 350. Started on gentle IV Lasix, later on transitioned to po lasix. Cleared by cardiology for discharge.     UTI (lower urinary tract infection): no dysuria, growing klebsiella and pseudomonas , initially started on imipenam, later on changed to fosfomycin.     Type II diabetes mellitus with peripheral circulatory disorder, uncontrolled (HCC) with secondary diabetic neuropathy: Continue insulin and sliding scale. A1c 2 months ago at 9.  CBG still above 200, slightly increased NPH. CBG (last 3)   Recent Labs  07/05/15 1157 07/05/15 1506 07/05/15 1623  GLUCAP 228* 209* 192*       Fall from standing   Left knee pain: No evidence of acute fracture. Unable to get MRI secondary to patient's weight. Discussed with Dr. Tamera Punt, orthopedic surgery. He recommended knee immobilizer and short-term skilled nursing. He will follow up with patient in the office and at that time, knee injection versus potential knee replacement if needed.  Family Communication: none at bedside.   Disposition:  D/c to SNF in am.    consultants: Orthopedic surgery    Ortho   Procedures:  None  Antibiotics:  IV Cipro 10/21-10/23    IV Primaxin 10/24-present   Objective: BP 155/71 mmHg  Pulse 66  Temp(Src) 97.2 F (36.2 C) (Oral)  Resp 20  Ht 6\' 1"  (1.854 m)  Wt 161.118 kg (355 lb 3.2 oz)  BMI 46.87 kg/m2  SpO2 100%  Intake/Output Summary (Last 24 hours) at 07/05/15 1700 Last data filed at 07/05/15 1437  Gross per 24 hour  Intake  920 ml  Output   4000 ml  Net  -3080 ml   Filed Weights   07/03/15 0525 07/04/15 0526 07/05/15 0502  Weight: 163.75 kg (361 lb 0.1 oz) 162.388 kg (358 lb) 161.118 kg (355 lb 3.2 oz)    Exam:   General:  Alert and oriented 3, feeling much better  Cardiovascular: Regular rate and rhythm, S1-S2  Respiratory: Clear to auscultation  bilaterally  Abdomen: Soft, obese, nontender, positive bowel sounds  Musculoskeletal: Trace pitting edema bilaterally   Data Reviewed: Basic Metabolic Panel:  Recent Labs Lab 07/01/15 1348 07/02/15 0500 07/03/15 0620 07/04/15 1120  NA 135 135 133* 135  K 3.9 3.6 3.5 3.8  CL 102 102 97* 98*  CO2 27 28 28 30   GLUCOSE 298* 216* 241* 217*  BUN 8 8 9 10   CREATININE 0.81 0.67 0.64 0.66  CALCIUM 8.4* 8.1* 8.3* 8.4*   Liver Function Tests: No results for input(s): AST, ALT, ALKPHOS, BILITOT, PROT, ALBUMIN in the last 168 hours. No results for input(s): LIPASE, AMYLASE in the last 168 hours. No results for input(s): AMMONIA in the last 168 hours. CBC:  Recent Labs Lab 07/01/15 1348 07/01/15 2217 07/02/15 0500  WBC 8.6 9.1 8.4  NEUTROABS 6.0  --   --   HGB 12.8* 11.9* 12.8*  HCT 38.1* 36.0* 38.6*  MCV 88.2 88.5 89.8  PLT 192 181 202   Cardiac Enzymes:    Recent Labs Lab 07/02/15 0500 07/02/15 1050 07/02/15 1705 07/03/15 1358 07/05/15 1537  TROPONINI <0.03 <0.03 <0.03 <0.03 <0.03   BNP (last 3 results)  Recent Labs  09/14/14 0644 12/22/14 2330 07/02/15 1439  BNP 95.8 120.1* 320.3*    ProBNP (last 3 results)  Recent Labs  08/27/14 1548  PROBNP 1121.0*    CBG:  Recent Labs Lab 07/04/15 2104 07/05/15 0733 07/05/15 1157 07/05/15 1506 07/05/15 1623  GLUCAP 216* 184* 228* 209* 192*    Recent Results (from the past 240 hour(s))  Urine culture     Status: None   Collection Time: 07/01/15  2:55 PM  Result Value Ref Range Status   Specimen Description URINE, CATHETERIZED  Final   Special Requests NONE  Final   Culture   Final    >=100,000 COLONIES/mL KLEBSIELLA PNEUMONIAE Confirmed Extended Spectrum Beta-Lactamase Producer (ESBL) >=100,000 COLONIES/mL PSEUDOMONAS AERUGINOSA Performed at [facility]    Report Status 07/05/2015 FINAL  Final   Organism ID, Bacteria KLEBSIELLA PNEUMONIAE  Final   Organism ID, Bacteria PSEUDOMONAS  AERUGINOSA  Final      Susceptibility   Klebsiella pneumoniae - MIC*    AMPICILLIN >=32 RESISTANT Resistant     CEFAZOLIN >=64 RESISTANT Resistant     CEFTRIAXONE >=64 RESISTANT Resistant     CIPROFLOXACIN >=4 RESISTANT Resistant     GENTAMICIN >=16 RESISTANT Resistant     IMIPENEM <=0.25 SENSITIVE Sensitive     NITROFURANTOIN 128 RESISTANT Resistant     TRIMETH/SULFA >=320 RESISTANT Resistant     AMPICILLIN/SULBACTAM >=32 RESISTANT Resistant     PIP/TAZO 16 SENSITIVE Sensitive     * >=100,000 COLONIES/mL KLEBSIELLA PNEUMONIAE   Pseudomonas aeruginosa - MIC*    CEFTAZIDIME 4 SENSITIVE Sensitive     CIPROFLOXACIN >=4 RESISTANT Resistant     GENTAMICIN >=16 RESISTANT Resistant     IMIPENEM >=16 RESISTANT Resistant     CEFEPIME 8 SENSITIVE Sensitive     * >=100,000 COLONIES/mL PSEUDOMONAS AERUGINOSA  MRSA PCR Screening     Status: Abnormal   Collection Time:  07/01/15  9:39 PM  Result Value Ref Range Status   MRSA by PCR POSITIVE (A) NEGATIVE Final    Comment:        The GeneXpert MRSA Assay (FDA approved for NASAL specimens only), is one component of a comprehensive MRSA colonization surveillance program. It is not intended to diagnose MRSA infection nor to guide or monitor treatment for MRSA infections. RESULT CALLED TO, READ BACK BY AND VERIFIED WITH: HAMILTON,P RN [phone] [id number] COVINGTON,N      Studies: No results found.  Scheduled Meds: . aspirin EC  81 mg Oral q morning - 10a  . Chlorhexidine Gluconate Cloth  6 each Topical Q0600  . enoxaparin (LOVENOX) injection  80 mg Subcutaneous Q24H  . ezetimibe  10 mg Oral Daily  . fosfomycin  3 g Oral Q48H  . furosemide  40 mg Oral Daily  . gi cocktail  30 mL Oral Once  . insulin aspart  0-9 Units Subcutaneous TID WC  . insulin NPH Human  18 Units Subcutaneous BID AC & HS  . isosorbide mononitrate  60 mg Oral Daily  . metoprolol  25 mg Oral BID  . mupirocin ointment  1 application Nasal BID  . pantoprazole  40 mg Oral  Daily  . rosuvastatin  20 mg Oral q1800  . sodium chloride  3 mL Intravenous Q12H  . sodium chloride  3 mL Intravenous Q12H    Continuous Infusions:    Time spent: 25 minutes  Teo Moede  [facility] Pager [phone]. If 7PM-7AM, please contact night-coverage at [email or web address], password [facility] 07/05/2015, 5:00 PM  LOS: 2 days

## 2015-07-05 NOTE — Progress Notes (Signed)
PT Cancellation Note  Patient Details Name: MCADOO LIVERPOOL MRN: [id number] DOB: [birth year]   Cancelled Treatment:    Reason Eval/Treat Not Completed: Medical issues which prohibited therapy (recent c/o chest tightness, troponins ordwered. checkback in AM.)   Claretha Cooper 07/05/2015, 3:39 PM Tresa Endo PT [phone]

## 2015-07-05 NOTE — Progress Notes (Signed)
Peripherally Inserted Central Catheter/Midline Placement  The IV Nurse has discussed with the patient and/or persons authorized to consent for the patient, the purpose of this procedure and the potential benefits and risks involved with this procedure.  The benefits include less needle sticks, lab draws from the catheter and patient may be discharged home with the catheter.  Risks include, but not limited to, infection, bleeding, blood clot (thrombus formation), and puncture of an artery; nerve damage and irregular heat beat.  Alternatives to this procedure were also discussed.  PICC/Midline Placement Documentation        Henderson [place] 07/05/2015, 11:52 AM

## 2015-07-05 NOTE — Progress Notes (Signed)
Patient complained of chest heaviness/tightness 8/10, not in  distress. Stated he feels sluggish, sleepy and weak. V/s checked ( see flowsheet) O2 on for comfort, EKG done, Nitrostat 2 doses given which he verbalized help ease with pain. MD paged made aware with order Troponin x3 and tylenol x 1 dose. Patient stated its easing off 4/10.

## 2015-07-05 NOTE — Progress Notes (Signed)
Pt states that he is going to SNF. IV ABX can be completed at SNF.

## 2015-07-06 DIAGNOSIS — R0602 Shortness of breath: Secondary | ICD-10-CM | POA: Diagnosis not present

## 2015-07-06 DIAGNOSIS — N319 Neuromuscular dysfunction of bladder, unspecified: Secondary | ICD-10-CM | POA: Diagnosis not present

## 2015-07-06 DIAGNOSIS — G4733 Obstructive sleep apnea (adult) (pediatric): Secondary | ICD-10-CM | POA: Diagnosis not present

## 2015-07-06 DIAGNOSIS — M1711 Unilateral primary osteoarthritis, right knee: Secondary | ICD-10-CM | POA: Diagnosis not present

## 2015-07-06 DIAGNOSIS — L97412 Non-pressure chronic ulcer of right heel and midfoot with fat layer exposed: Secondary | ICD-10-CM | POA: Diagnosis not present

## 2015-07-06 DIAGNOSIS — I2510 Atherosclerotic heart disease of native coronary artery without angina pectoris: Secondary | ICD-10-CM | POA: Diagnosis not present

## 2015-07-06 DIAGNOSIS — M7542 Impingement syndrome of left shoulder: Secondary | ICD-10-CM | POA: Diagnosis not present

## 2015-07-06 DIAGNOSIS — L97419 Non-pressure chronic ulcer of right heel and midfoot with unspecified severity: Secondary | ICD-10-CM | POA: Diagnosis not present

## 2015-07-06 DIAGNOSIS — M1712 Unilateral primary osteoarthritis, left knee: Secondary | ICD-10-CM | POA: Diagnosis not present

## 2015-07-06 DIAGNOSIS — I639 Cerebral infarction, unspecified: Secondary | ICD-10-CM | POA: Diagnosis not present

## 2015-07-06 DIAGNOSIS — R5381 Other malaise: Secondary | ICD-10-CM | POA: Diagnosis not present

## 2015-07-06 DIAGNOSIS — M1990 Unspecified osteoarthritis, unspecified site: Secondary | ICD-10-CM | POA: Diagnosis not present

## 2015-07-06 DIAGNOSIS — M6281 Muscle weakness (generalized): Secondary | ICD-10-CM | POA: Diagnosis not present

## 2015-07-06 DIAGNOSIS — Z9181 History of falling: Secondary | ICD-10-CM | POA: Diagnosis not present

## 2015-07-06 DIAGNOSIS — K219 Gastro-esophageal reflux disease without esophagitis: Secondary | ICD-10-CM | POA: Diagnosis not present

## 2015-07-06 DIAGNOSIS — I509 Heart failure, unspecified: Secondary | ICD-10-CM | POA: Diagnosis not present

## 2015-07-06 DIAGNOSIS — R0789 Other chest pain: Secondary | ICD-10-CM | POA: Diagnosis not present

## 2015-07-06 DIAGNOSIS — R41841 Cognitive communication deficit: Secondary | ICD-10-CM | POA: Diagnosis not present

## 2015-07-06 DIAGNOSIS — I209 Angina pectoris, unspecified: Secondary | ICD-10-CM | POA: Diagnosis not present

## 2015-07-06 DIAGNOSIS — I5032 Chronic diastolic (congestive) heart failure: Secondary | ICD-10-CM | POA: Diagnosis not present

## 2015-07-06 DIAGNOSIS — E1165 Type 2 diabetes mellitus with hyperglycemia: Secondary | ICD-10-CM | POA: Diagnosis not present

## 2015-07-06 DIAGNOSIS — E785 Hyperlipidemia, unspecified: Secondary | ICD-10-CM | POA: Diagnosis not present

## 2015-07-06 DIAGNOSIS — E1142 Type 2 diabetes mellitus with diabetic polyneuropathy: Secondary | ICD-10-CM | POA: Diagnosis not present

## 2015-07-06 DIAGNOSIS — I10 Essential (primary) hypertension: Secondary | ICD-10-CM | POA: Diagnosis not present

## 2015-07-06 DIAGNOSIS — R262 Difficulty in walking, not elsewhere classified: Secondary | ICD-10-CM | POA: Diagnosis not present

## 2015-07-06 DIAGNOSIS — M25562 Pain in left knee: Secondary | ICD-10-CM | POA: Diagnosis not present

## 2015-07-06 DIAGNOSIS — N390 Urinary tract infection, site not specified: Secondary | ICD-10-CM | POA: Diagnosis not present

## 2015-07-06 DIAGNOSIS — R0600 Dyspnea, unspecified: Secondary | ICD-10-CM | POA: Diagnosis not present

## 2015-07-06 LAB — GLUCOSE, CAPILLARY
Glucose-Capillary: 145 mg/dL — ABNORMAL HIGH (ref 65–99)
Glucose-Capillary: 180 mg/dL — ABNORMAL HIGH (ref 65–99)

## 2015-07-06 LAB — TROPONIN I: Troponin I: <0.03 ng/mL (ref <0.031)

## 2015-07-06 MED ORDER — SODIUM CHLORIDE 0.9 % IJ SOLN
10.0000 mL | INTRAMUSCULAR | Status: DC | PRN
  Administered 2015-07-06: 10 mL

## 2015-07-06 MED ORDER — FOSFOMYCIN TROMETHAMINE 3 G PO PACK: 3.0000 g | PACK | Freq: Once | ORAL | Status: DC

## 2015-07-06 MED ORDER — ROSUVASTATIN CALCIUM 20 MG PO TABS: 20.0000 mg | ORAL_TABLET | Freq: Every day | ORAL | Status: DC

## 2015-07-06 MED ORDER — ISOSORBIDE MONONITRATE ER 60 MG PO TB24: 60.0000 mg | ORAL_TABLET | Freq: Every day | ORAL | Status: DC

## 2015-07-06 MED ORDER — INSULIN ASPART 100 UNIT/ML ~~LOC~~ SOLN: 0.0000 [IU] | Freq: Three times a day (TID) | SUBCUTANEOUS | Status: DC

## 2015-07-06 MED ORDER — METOPROLOL TARTRATE 25 MG PO TABS: 25.0000 mg | ORAL_TABLET | Freq: Two times a day (BID) | ORAL | Status: DC

## 2015-07-06 NOTE — Progress Notes (Signed)
Report called to [facility] health care, spoke to [place]. Questions, concerns denied r/t instruction

## 2015-07-06 NOTE — Progress Notes (Signed)
Pt for discharge to [facility].   [name] facilitated pt discharge needs including contacting facility, sending pt discharge information to [facility], discussing with pt at bedside, providing RN phone number to call report, and arranging ambulance transport for pt to [facility].   Pt did not identify anyone that he wished for [name] to notify about transfer to [facility].   No further social work needs identified at this time.  [name] signing off.   [name], MSW, [place] [facility] [phone]

## 2015-07-06 NOTE — Discharge Summary (Signed)
Physician Discharge Summary  [name] [id number] DOB: 1946/03/08 DOA: 07/01/2015  PCP: Irven Shelling, MD  Admit date: 07/01/2015 Discharge date: 07/06/2015  Time spent: 30 minutes  Recommendations for Outpatient Follow-up:  1. Follow upw ith orthopedics Dr Tamera Punt as recommended.  2. Follow up with PCP in one week.  3. Please complete the course of antibiotics.   Discharge Diagnoses:  Principal Problem:   Chronic diastolic CHF (congestive heart failure) (HCC) Active Problems:   Diabetic neuropathy (HCC)   Chest pain   UTI (lower urinary tract infection)   Type II diabetes mellitus with peripheral circulatory disorder, uncontrolled ([place])   Fall from standing   Left knee pain   Weakness generalized   Morbid obesity ([place])   Discharge Condition: improved  Diet recommendation: low sodium diet.   Filed Weights   07/04/15 0526 07/05/15 0502 07/06/15 0543  Weight: 162.388 kg (358 lb) 161.118 kg (355 lb 3.2 oz) 158.986 kg (350 lb 8 oz)    History of present illness:  69 year old male past medical history of morbid obesity, chronic diastolic heart failure and diabetes mellitus was standing with his walker when his left knee gave out and he fell hitting his head. He did not lose consciousness. He presented to the emergency room on 10/21 unable to walk. He's had some intermittent episodes of chest pain over the last few weeks. The emergency room, x-ray of knee was unrevealing elevated no degenerative arthritis. Reportedly patient was told many years ago by [place]opedist and [place] that he would need to have that knee replaced, which she has avoided. Overnight from admission, patient had some episodes of chest pain, but EKG and troponins have all been negative and no events by telemetry. Patient also on admission noted to have a significant urinary tract infection and started on antibiotics.  Following admission, patient has had several episodes of brief few second  pauses on telemetry, chest pressure with negative troponins. EKGs unrevealing. Seen by cardiology who reviewed patient's previous cath, we ordered echo which noted a grade 1 diastolic dysfunction and titrated down beta blocker. Urine cultures came back positive for resistant Klebsiella and pseudomonas and Started on IV Primaxin initially, but unfortunately the pseudomonas is resistent to imipenams, so changed to fosfomycin that works on both klebsiella and pseudomonas.   Hospital Course:  Chest pain: Enzymes cycled. Seen by cardiology, felt to not be an acute ACS. Recurrent chest pain 10/26 after the PICC line was placed as per the patient. Observed overnight and cycled cardiac enzymes are negative.   EKG unremarkable, troponin is negative.  Remove PICC line.    Chronic diastolic CHF (congestive heart failure) ([place]): BNP checked and found to be mildly elevated at 350. Started on gentle IV Lasix, later on transitioned to po lasix. Cleared by cardiology for discharge.    UTI (lower urinary tract infection): no dysuria, growing klebsiella and pseudomonas , initially started on imipenam, later on changed to fosfomycin. One more dose of fosfomycin to complete teh course.    Type II diabetes mellitus with peripheral circulatory disorder, uncontrolled (HCC) with secondary diabetic neuropathy: Continue insulin and sliding scale. A1c 2 months ago at 9.         Fall from standing with Left knee pain: No evidence of acute fracture. Unable to get MRI secondary to patient's weight. Discussed with Dr. Tamera Punt, orthopedic surgery. He recommended knee immobilizer and short-term skilled nursing. He will follow up with patient in the office and at that time, knee injection  versus potential knee replacement if needed.       Procedures:  None   Consultations: Cardiology Orthopedics.  Discharge Exam: Filed Vitals:   07/06/15 1043  BP: 148/76  Pulse: 71  Temp:   Resp:     General: alert  afebrile comfortable Cardiovascular: s1s2 Respiratory: ctab  Discharge Instructions   Discharge Instructions    AMB Referral to [place] Management    Complete by:  As directed   Please assign to [facility] LCSW. Patient may go to SNF at discharge. May need assist with facilitating long term. Please assign to [place] for CHF disease management when patient leaves SNF or goes home from hospital. THanks. Please call with questions. Consent signed. [name], [place], [facility] [phone]  Reason for consult:  Please assign to [facility] LCSW and [facility] RNCM if patient goes home  Diagnoses of:  Heart Failure  Expected date of contact:  1-3 days (reserved for hospital discharges)     Diet - low sodium heart healthy    Complete by:  As directed      Discharge instructions    Complete by:  As directed   Follow up with PCP in one week.  Follow up with cardiology as needed.          Current Discharge Medication List    START taking these medications   Details  fosfomycin (MONUROL) 3 G PACK Take 3 g by mouth once. Qty: 3 g, Refills: 0    insulin aspart (NOVOLOG) 100 UNIT/ML injection Inject 0-9 Units into the skin 3 (three) times daily with meals. Qty: 10 mL, Refills: 11    rosuvastatin (CRESTOR) 20 MG tablet Take 1 tablet (20 mg total) by mouth daily at 6 PM.      CONTINUE these medications which have CHANGED   Details  isosorbide mononitrate (IMDUR) 60 MG 24 hr tablet Take 1 tablet (60 mg total) by mouth daily.    metoprolol tartrate (LOPRESSOR) 25 MG tablet Take 1 tablet (25 mg total) by mouth 2 (two) times daily.      CONTINUE these medications which have NOT CHANGED   Details  acetaminophen (TYLENOL) 325 MG tablet Take 2 tablets (650 mg total) by mouth every 4 (four) hours as needed for headache or mild pain.    aspirin EC 81 MG tablet Take 81 mg by mouth every morning.     calcium carbonate (TUMS - DOSED IN MG ELEMENTAL CALCIUM) 500 MG chewable tablet  Chew 1 tablet (200 mg of elemental calcium total) by mouth 3 (three) times daily as needed for indigestion or heartburn.    ezetimibe (ZETIA) 10 MG tablet Take 1 tablet (10 mg total) by mouth daily. Qty: 30 tablet, Refills: 11    fexofenadine (ALLEGRA) 180 MG tablet Take 180 mg by mouth daily as needed for allergies or rhinitis.    furosemide (LASIX) 40 MG tablet Take 1 tablet (40 mg total) by mouth daily. Qty: 30 tablet, Refills: 11    insulin NPH Human (HUMULIN N) 100 UNIT/ML injection Inject 0.2 mLs (20 Units total) into the skin 2 (two) times daily at 8 am and 10 pm. Qty: 10 mL, Refills: 11    lisinopril (PRINIVIL,ZESTRIL) 5 MG tablet Take 5 mg by mouth daily. Refills: 3    nitroGLYCERIN (NITROSTAT) 0.4 MG SL tablet Place 1 tablet (0.4 mg total) under the tongue every 5 (five) minutes as needed for chest pain. Qty: 25 tablet, Refills: 2    omeprazole (PRILOSEC)  20 MG capsule Take 20 mg by mouth daily.    oxybutynin (DITROPAN) 5 MG tablet Take 5 mg by mouth daily as needed for bladder spasms.    senna-docusate (SENOKOT-S) 8.6-50 MG per tablet Take 2 tablets by mouth daily. Qty: 28 tablet, Refills: 0      STOP taking these medications     ciprofloxacin (CIPRO) 500 MG tablet      diphenoxylate-atropine (LOMOTIL) 2.5-0.025 MG per tablet      losartan (COZAAR) 25 MG tablet      HYDROcodone-acetaminophen (NORCO/VICODIN) 5-325 MG per tablet      nitrofurantoin, macrocrystal-monohydrate, (MACROBID) 100 MG capsule        Allergies  Allergen Reactions  . Ace Inhibitors Swelling    Pt tolerates lisinopril  . Lipitor [Atorvastatin Calcium] Swelling  . Metformin And Related Swelling  . Cefadroxil Hives  . Cephalexin Hives  . Morphine And Related Other (See Comments)    Sweating, feels like is "in rocky boat."  . Robaxin [Methocarbamol] Other (See Comments)    Feels like he is shaky   Follow-up Information    Follow up with Eileen Stanford, PA-C On 07/19/2015.    Specialties:  Cardiology, Radiology   Why:  Cardiology Follow-Up on 07/19/2015 at 10:30AM.   Contact information:   [place] [phone]       Follow up with [facility] .   Specialty:  [place] information:   [address] [place] [address] [phone]       The results of significant diagnostics from this hospitalization (including imaging, microbiology, ancillary and laboratory) are listed below for reference.    Significant Diagnostic Studies: Dg Tibia/fibula Left  07/01/2015  CLINICAL DATA:  Weakness with recent fall EXAM: LEFT TIBIA AND FIBULA - 2 VIEW COMPARISON:  None. FINDINGS: Degenerative changes of the knee and ankle joints are seen. No acute fracture or dislocation is noted. No soft tissue abnormality is seen. IMPRESSION: Degenerative change without acute abnormality. Electronically Signed   By: Inez Catalina M.D.   On: 07/01/2015 13:46   Ct Head Wo Contrast  07/01/2015  CLINICAL DATA:  Recent falls with increased lethargy EXAM: CT HEAD WITHOUT CONTRAST TECHNIQUE: Contiguous axial images were obtained from the base of the skull through the vertex without intravenous contrast. COMPARISON:  04/14/2015 FINDINGS: Bony calvarium is intact. No gross soft tissue abnormality is noted. Mild atrophic changes are noted commenced with patient's given age. There are again calcifications within the basal ganglia and bowel my bilaterally. No findings to suggest acute hemorrhage, acute infarction or space-occupying mass lesion are noted. IMPRESSION: No acute abnormality noted. Electronically Signed   By: Inez Catalina M.D.   On: 07/01/2015 14:15   Dg Knee Complete 4 Views Left  07/01/2015  CLINICAL DATA:  Left knee gave out this morning.  No recent injury. EXAM: LEFT KNEE - COMPLETE 4+ VIEW COMPARISON:  None. FINDINGS: Exam demonstrates mild decreased bone mineralization. There are  abundant overlying soft tissues. There are tricompartmental degenerative changes most prominent over the lateral compartment and patellofemoral joints. No evidence of fracture or dislocation. No significant joint effusion. IMPRESSION: No acute findings. 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 Electronically Signed   By: Marin Olp M.D.   On: 07/01/2015 13:46    Microbiology: Recent Results (from the past 240 hour(s))  Urine culture     Status: None  Collection Time: 07/01/15  2:55 PM  Result Value Ref Range Status   Specimen Description URINE, CATHETERIZED  Final   Special Requests NONE  Final   Culture   Final    >=100,000 COLONIES/mL KLEBSIELLA PNEUMONIAE Confirmed Extended Spectrum Beta-Lactamase Producer (ESBL) >=100,000 COLONIES/mL PSEUDOMONAS AERUGINOSA Performed at [facility]    Report Status 07/05/2015 FINAL  Final   Organism ID, Bacteria KLEBSIELLA PNEUMONIAE  Final   Organism ID, Bacteria PSEUDOMONAS AERUGINOSA  Final      Susceptibility   Klebsiella pneumoniae - MIC*    AMPICILLIN >=32 RESISTANT Resistant     CEFAZOLIN >=64 RESISTANT Resistant     CEFTRIAXONE >=64 RESISTANT Resistant     CIPROFLOXACIN >=4 RESISTANT Resistant     GENTAMICIN >=16 RESISTANT Resistant     IMIPENEM <=0.25 SENSITIVE Sensitive     NITROFURANTOIN 128 RESISTANT Resistant     TRIMETH/SULFA >=320 RESISTANT Resistant     AMPICILLIN/SULBACTAM >=32 RESISTANT Resistant     PIP/TAZO 16 SENSITIVE Sensitive     * >=100,000 COLONIES/mL KLEBSIELLA PNEUMONIAE   Pseudomonas aeruginosa - MIC*    CEFTAZIDIME 4 SENSITIVE Sensitive     CIPROFLOXACIN >=4 RESISTANT Resistant     GENTAMICIN >=16 RESISTANT Resistant     IMIPENEM >=16 RESISTANT Resistant     CEFEPIME 8 SENSITIVE Sensitive     * >=100,000 COLONIES/mL PSEUDOMONAS AERUGINOSA  MRSA PCR Screening     Status: Abnormal   Collection Time: 07/01/15  9:39 PM  Result Value Ref Range  Status   MRSA by PCR POSITIVE (A) NEGATIVE Final    Comment:        The GeneXpert MRSA Assay (FDA approved for NASAL specimens only), is one component of a comprehensive MRSA colonization surveillance program. It is not intended to diagnose MRSA infection nor to guide or monitor treatment for MRSA infections. RESULT CALLED TO, READ BACK BY AND VERIFIED WITH: HAMILTON,P RN [phone] COVINGTON,N      Labs: Basic Metabolic Panel:  Recent Labs Lab 07/01/15 1348 07/02/15 0500 07/03/15 0620 07/04/15 1120  NA 135 135 133* 135  K 3.9 3.6 3.5 3.8  CL 102 102 97* 98*  CO2 27 28 28 30   GLUCOSE 298* 216* 241* 217*  BUN 8 8 9 10   CREATININE 0.81 0.67 0.64 0.66  CALCIUM 8.4* 8.1* 8.3* 8.4*   Liver Function Tests: No results for input(s): AST, ALT, ALKPHOS, BILITOT, PROT, ALBUMIN in the last 168 hours. No results for input(s): LIPASE, AMYLASE in the last 168 hours. No results for input(s): AMMONIA in the last 168 hours. CBC:  Recent Labs Lab 07/01/15 1348 07/01/15 2217 07/02/15 0500  WBC 8.6 9.1 8.4  NEUTROABS 6.0  --   --   HGB 12.8* 11.9* 12.8*  HCT 38.1* 36.0* 38.6*  MCV 88.2 88.5 89.8  PLT 192 181 202   Cardiac Enzymes:  Recent Labs Lab 07/02/15 1705 07/03/15 1358 07/05/15 1537 07/05/15 2045 07/06/15 0308  TROPONINI <0.03 <0.03 <0.03 <0.03 <0.03   BNP: BNP (last 3 results)  Recent Labs  09/14/14 0644 12/22/14 2330 07/02/15 1439  BNP 95.8 120.1* 320.3*    ProBNP (last 3 results)  Recent Labs  08/27/14 1548  PROBNP 1121.0*    CBG:  Recent Labs Lab 07/05/15 1506 07/05/15 1623 07/05/15 2118 07/06/15 0739 07/06/15 1209  GLUCAP 209* 192* 222* 145* 180*       Signed:  Angles Trevizo  [facility] 07/06/2015, 12:20 PM

## 2015-07-06 NOTE — NC FL2 (Signed)
[place] LEVEL OF CARE SCREENING TOOL     IDENTIFICATION  Patient Name: [name] Birthdate: [birth year] Sex: male Admission Date (Current Location): 07/01/2015  [facility] [place] Number:     Facility and Address:         Provider Number: [id number]  Attending Physician Name and Address:  Hosie Poisson, MD  Relative Name and Phone Number:       Current Level of Care: Hospital Recommended Level of Care: Nursing Facility Prior Approval Number:    Date Approved/Denied:   PASRR Number: [id number]  Discharge Plan: SNF    Current Diagnoses: Patient Active Problem List   Diagnosis Date Noted  . Morbid obesity ([place]) 07/03/2015  . Fall from standing 07/01/2015  . Left knee pain 07/01/2015  . Weakness generalized 07/01/2015  . Type II diabetes mellitus with peripheral circulatory disorder, uncontrolled ([place]) 05/09/2015  . Unstable angina ([place]) 05/06/2015  . Hyperlipidemia LDL goal <70 05/02/2015  . Abnormal nuclear stress test 05/02/2015  . Neurogenic bladder 04/26/2015  . Pressure ulcer 04/15/2015  . Mental confusion 04/14/2015  . UTI (lower urinary tract infection) 04/14/2015  . History of DVT (deep vein thrombosis) 12/08/2014  . Right-sided abdominal pain of unknown cause   . Unresponsive episode- suspected abscence seizures 11/15/2014  . Diarrhea 11/15/2014  . Chest pain at rest 11/15/2014  . Faintness   . Diabetes type 2, uncontrolled ([place])   . Abnormal urinalysis 11/10/2014  . Chest pain 11/10/2014  . Axillary nodule 11/10/2014  . GERD (gastroesophageal reflux disease) 09/06/2014  . Syncope 04/01/2014  . Chronic diastolic CHF (congestive heart failure) ([place]) 04/01/2014  . Physical deconditioning 12/25/2013  . Chest pain with moderate risk of acute coronary syndrome 12/23/2013  . Chronic indwelling Foley catheter 12/23/2013  . Gait abnormality 03/30/2012  . CVA (cerebral infarction) 03/11/2012  . Obstipation 03/11/2012  . CAD- known  RCA disease- medical Rx 11/15/2011  . NSVT (nonsustained ventricular tachycardia) ([place]) 10/26/2011  . Diabetic neuropathy ([place]) 10/10/2011  . OSA (obstructive sleep apnea) 10/10/2011  . Generalized headaches 10/10/2011  . Hypertension 10/10/2011    Orientation ACTIVITIES/SOCIAL BLADDER RESPIRATION  Constantly   Indwelling catheter Normal  BEHAVIORAL SYMPTOMS/MOOD NEUROLOGICAL BOWEL NUTRITION STATUS      Incontinent Diet, Weight, Height (Carb Modified, height 72 inches, weight 355 lbs)  PHYSICIAN VISITS COMMUNICATION OF NEEDS Height & Weight Skin    Verbally     Other (Comment) (Diabetic ulcer right 2nd and 3rd toe, dry;red no dressing; Bilateral LE chronic ulcerations (healing). See Wound Care note.)          AMBULATORY STATUS RESPIRATION    Semi-Ambulatory (+2 assistance needed, pt has knee immobilizer on L knee) Normal      Personal Care Assistance Level of Assistance  Bathing, Dressing            Functional Limitations Info  Sight             SPECIAL CARE FACTORS FREQUENCY  PT (By licensed PT)     PT Frequency: 5x week             Additional Factors Info                  Current Medications (07/06/2015): Current Facility-Administered Medications  Medication Dose Route Frequency Provider Last Rate Last Dose  . 0.9 %  sodium chloride infusion  250 mL Intravenous PRN Belkys A Regalado, MD      . acetaminophen (TYLENOL) tablet 650 mg  650  mg Oral Q6H PRN Belkys A Regalado, MD   650 mg at 07/05/15 1511   Or  . acetaminophen (TYLENOL) suppository 650 mg  650 mg Rectal Q6H PRN Belkys A Regalado, MD      . ALPRAZolam Duanne Moron) tablet 0.25 mg  0.25 mg Oral TID PRN Annita Brod, MD   0.25 mg at 07/05/15 0030  . aspirin EC tablet 81 mg  81 mg Oral q morning - 10a Belkys A Regalado, MD   81 mg at 07/06/15 1043  . Chlorhexidine Gluconate Cloth 2 % PADS 6 each  6 each Topical Q0600 Belkys A Regalado, MD   6 each at 07/06/15 0600  . enoxaparin (LOVENOX)  injection 80 mg  80 mg Subcutaneous Q24H Minda Ditto, RPH   80 mg at 07/05/15 2239  . ezetimibe (ZETIA) tablet 10 mg  10 mg Oral Daily Belkys A Regalado, MD   10 mg at 07/06/15 1044  . fentaNYL (SUBLIMAZE) injection 12.5 mcg  12.5 mcg Intravenous Q3H PRN Belkys A Regalado, MD   12.5 mcg at 07/03/15 1618  . fosfomycin (MONUROL) packet 3 g  3 g Oral Q48H Hosie Poisson, MD   3 g at 07/05/15 1447  . furosemide (LASIX) tablet 40 mg  40 mg Oral Daily Eileen Stanford, PA-C   40 mg at 07/06/15 1044  . HYDROcodone-acetaminophen (NORCO/VICODIN) 5-325 MG per tablet 1 tablet  1 tablet Oral Q6H PRN Belkys A Regalado, MD   1 tablet at 07/06/15 1045  . insulin aspart (novoLOG) injection 0-9 Units  0-9 Units Subcutaneous TID WC Belkys A Regalado, MD   1 Units at 07/06/15 0836  . insulin NPH Human (HUMULIN N,NOVOLIN N) injection 18 Units  18 Units Subcutaneous BID AC & HS Annita Brod, MD   18 Units at 07/06/15 [phone]  . isosorbide mononitrate (IMDUR) 24 hr tablet 60 mg  60 mg Oral Daily Troy Sine, MD   60 mg at 07/06/15 1044  . metoprolol tartrate (LOPRESSOR) tablet 25 mg  25 mg Oral BID Troy Sine, MD   25 mg at 07/06/15 1045  . mupirocin ointment (BACTROBAN) 2 % 1 application  1 application Nasal BID Elmarie Shiley, MD   1 application at [phone]  . nitroGLYCERIN (NITROSTAT) SL tablet 0.4 mg  0.4 mg Sublingual Q5 min PRN Belkys A Regalado, MD   0.4 mg at 07/05/15 1519  . ondansetron (ZOFRAN) tablet 4 mg  4 mg Oral Q6H PRN Belkys A Regalado, MD       Or  . ondansetron (ZOFRAN) injection 4 mg  4 mg Intravenous Q6H PRN Belkys A Regalado, MD      . oxybutynin (DITROPAN) tablet 5 mg  5 mg Oral Daily PRN Belkys A Regalado, MD      . pantoprazole (PROTONIX) EC tablet 40 mg  40 mg Oral Daily Belkys A Regalado, MD   40 mg at 07/06/15 1045  . rosuvastatin (CRESTOR) tablet 20 mg  20 mg Oral q1800 Eileen Stanford, PA-C   20 mg at 07/05/15 1702  . senna-docusate (Senokot-S) tablet 2 tablet  2  tablet Oral BID PRN Belkys A Regalado, MD      . sodium chloride 0.9 % injection 10-40 mL  10-40 mL Intracatheter PRN Hosie Poisson, MD   10 mL at 07/06/15 0308  . sodium chloride 0.9 % injection 3 mL  3 mL Intravenous Q12H Belkys A Regalado, MD   3 mL at 07/03/15 2306  .  sodium chloride 0.9 % injection 3 mL  3 mL Intravenous Q12H Belkys A Regalado, MD   3 mL at 07/04/15 0916  . sodium chloride 0.9 % injection 3 mL  3 mL Intravenous PRN Belkys A Regalado, MD       Do not use this list as official medication orders. Please verify with discharge summary.  Discharge Medications:   Medication List    ASK your doctor about these medications        acetaminophen 325 MG tablet  Commonly known as:  TYLENOL  Take 2 tablets (650 mg total) by mouth every 4 (four) hours as needed for headache or mild pain.     aspirin EC 81 MG tablet  Take 81 mg by mouth every morning.     calcium carbonate 500 MG chewable tablet  Commonly known as:  TUMS - dosed in mg elemental calcium  Chew 1 tablet (200 mg of elemental calcium total) by mouth 3 (three) times daily as needed for indigestion or heartburn.     ciprofloxacin 500 MG tablet  Commonly known as:  CIPRO  Take 1 tablet (500 mg total) by mouth every 12 (twelve) hours.     diphenoxylate-atropine 2.5-0.025 MG tablet  Commonly known as:  LOMOTIL  Take 1 tablet by mouth 3 (three) times daily as needed. For diarrhea     ezetimibe 10 MG tablet  Commonly known as:  ZETIA  Take 1 tablet (10 mg total) by mouth daily.     fexofenadine 180 MG tablet  Commonly known as:  ALLEGRA  Take 180 mg by mouth daily as needed for allergies or rhinitis.     furosemide 40 MG tablet  Commonly known as:  LASIX  Take 1 tablet (40 mg total) by mouth daily.     HYDROcodone-acetaminophen 5-325 MG tablet  Commonly known as:  NORCO/VICODIN  Take 1 tablet by mouth every 6 (six) hours as needed for severe pain.     insulin NPH Human 100 UNIT/ML injection  Commonly known  as:  HUMULIN N  Inject 0.2 mLs (20 Units total) into the skin 2 (two) times daily at 8 am and 10 pm.     isosorbide mononitrate 30 MG 24 hr tablet  Commonly known as:  IMDUR  Take 1 tablet (30 mg total) by mouth daily.     lisinopril 5 MG tablet  Commonly known as:  PRINIVIL,ZESTRIL  Take 5 mg by mouth daily.     losartan 25 MG tablet  Commonly known as:  COZAAR  Take 1 tablet (25 mg total) by mouth daily.     metoprolol 50 MG tablet  Commonly known as:  LOPRESSOR  Take 1 tablet (50 mg total) by mouth 2 (two) times daily.     nitrofurantoin (macrocrystal-monohydrate) 100 MG capsule  Commonly known as:  MACROBID  Take 1 capsule (100 mg total) by mouth 2 (two) times daily.     nitroGLYCERIN 0.4 MG SL tablet  Commonly known as:  NITROSTAT  Place 1 tablet (0.4 mg total) under the tongue every 5 (five) minutes as needed for chest pain.     omeprazole 20 MG capsule  Commonly known as:  PRILOSEC  Take 20 mg by mouth daily.     oxybutynin 5 MG tablet  Commonly known as:  DITROPAN  Take 5 mg by mouth daily as needed for bladder spasms.     senna-docusate 8.6-50 MG tablet  Commonly known as:  Senokot-S  Take 2 tablets by mouth  daily.        Relevant Imaging Results:  Relevant Lab Results:  Recent Labs    Additional Information FULL code status. Allergies: Ace Inhibitors, Lipitor, Metformin And Related, Cefadroxil, Cephalexin, Morphine And Related, Robaxin. Contact precautions: Urine culture 07/01/15 Klebsiella Pneumoniae confirmed ESBL. Resistant to multiple classes. MDRO. Requires contact precautions with each admission. Pt will have PICC line for IV antibiotics at SNF.    Ludwig Clarks, LCSW

## 2015-07-06 NOTE — Progress Notes (Signed)
Pt refusing bed alarm at this time. Pt states that he is just trying to move up in the bed and that every time he does so, the bed alarm goes off. Pt educated to not get up without staff assistance and replied, "I couldn't even get up on my own, so I won't get up by myself." Will continue to monitor pt closely. [name] I

## 2015-07-06 NOTE — Progress Notes (Signed)
[name] has provided patient with SNF bed offer of [facility]- the only offer. Patient agreeable to this plan- will advise MD of bed offer and FL2 for further dc planning.  Patient inquiring about motorized wheelchair for home use- [name] has advised him to seek guidance on this while in SNF.   Eduard Clos, MSW, [place]

## 2015-07-06 NOTE — Clinical Social Work Placement (Signed)
   CLINICAL SOCIAL WORK PLACEMENT  NOTE  Date:  07/06/2015  Patient Details  Name: [name] MRN: [id number] Date of Birth: 07-08-1946  Clinical Social Work is seeking post-discharge placement for this patient at the [place] level of care (*[name] will initial, date and re-position this form in  chart as items are completed):  Yes   Patient/family provided with [place] Work Department's list of facilities offering this level of care within the geographic area requested by the patient (or if unable, by the patient's family).  Yes   Patient/family informed of their freedom to choose among providers that offer the needed level of care, that participate in Medicare, Medicaid or managed care program needed by the patient, have an available bed and are willing to accept the patient.  Yes   Patient/family informed of [facility]'s ownership interest in [facility] and [facility], as well as of the fact that they are under no obligation to receive care at these facilities.  PASRR submitted to EDS on       PASRR number received on       Existing PASRR number confirmed on 07/05/15     FL2 transmitted to all facilities in geographic area requested by pt/family on 07/05/15     FL2 transmitted to all facilities within larger geographic area on       Patient informed that his/her managed care company has contracts with or will negotiate with certain facilities, including the following:          07/06/2015  Patient/family informed of bed offers received.  Patient chooses bed at  [facility]     Physician recommends and patient chooses bed at      Patient to be transferred to   on  .  Patient to be transferred to facility by       Patient family notified on   of transfer.  Name of family member notified:        PHYSICIAN Please sign FL2     Additional Comment:    _______________________________________________ Ludwig Clarks, LCSW 07/06/2015, 11:34 AM

## 2015-07-06 NOTE — Clinical Social Work Placement (Signed)
   CLINICAL SOCIAL WORK PLACEMENT  NOTE  Date:  07/06/2015  Patient Details  Name: [name] MRN: [id number] Date of Birth: 07/10/1946  Clinical Social Work is seeking post-discharge placement for this patient at the [place] level of care (*[name] will initial, date and re-position this form in  chart as items are completed):  Yes   Patient/family provided with [place] Work Department's list of facilities offering this level of care within the geographic area requested by the patient (or if unable, by the patient's family).  Yes   Patient/family informed of their freedom to choose among providers that offer the needed level of care, that participate in Medicare, Medicaid or managed care program needed by the patient, have an available bed and are willing to accept the patient.  Yes   Patient/family informed of [facility]'s ownership interest in [facility] and [facility], as well as of the fact that they are under no obligation to receive care at these facilities.  PASRR submitted to EDS on       PASRR number received on       Existing PASRR number confirmed on 07/05/15     FL2 transmitted to all facilities in geographic area requested by pt/family on 07/05/15     FL2 transmitted to all facilities within larger geographic area on       Patient informed that his/her managed care company has contracts with or will negotiate with certain facilities, including the following:        Yes   Patient/family informed of bed offers received.  Patient chooses bed at [facility]     Physician recommends and patient chooses bed at      Patient to be transferred to [facility] on 07/06/15.  Patient to be transferred to facility by ambulance [facility]y [facility])     Patient family notified on 07/06/15 of transfer.  Name of family member notified:  pt notified at bedside     PHYSICIAN Please sign FL2     Additional Comment:     _______________________________________________ Ladell Pier, LCSW 07/06/2015, 5:10 PM

## 2015-07-08 ENCOUNTER — Other Ambulatory Visit: Payer: Self-pay | Admitting: *Deleted

## 2015-07-08 DIAGNOSIS — R0600 Dyspnea, unspecified: Secondary | ICD-10-CM | POA: Diagnosis not present

## 2015-07-08 DIAGNOSIS — R0602 Shortness of breath: Secondary | ICD-10-CM | POA: Diagnosis not present

## 2015-07-08 DIAGNOSIS — I2510 Atherosclerotic heart disease of native coronary artery without angina pectoris: Secondary | ICD-10-CM | POA: Diagnosis not present

## 2015-07-08 DIAGNOSIS — I209 Angina pectoris, unspecified: Secondary | ICD-10-CM | POA: Diagnosis not present

## 2015-07-08 NOTE — Patient Outreach (Signed)
[facility]) Care Management  07/08/2015  [place] [birth year] [id number]   [name] made a second attempt to try and contact patient today to perform phone assessment, as well as assess and assist with social work needs and services, without success.  [name] noted that patient was discharged from [facility] on Wednesday, October 26th and placed at [facility] for short-term care rehabilitative services.  The plan is for patient to receive therapies (physical and occupational), then return home to live independently.  [name] left a HIPAA compliant message for patient with patient's attending nurse at the facility and is currently awaiting a return call.    [name], BSW, MSW, LCSW  Licensed Education officer, environmental Health System  Mailing [place] [address] [place], [place] [address] Physical Address-[address], [place], [place] [address] Toll Free Main # [phone] Fax # [phone] Cell # [phone]  Fax # [phone]  [name]

## 2015-07-11 DIAGNOSIS — M6281 Muscle weakness (generalized): Secondary | ICD-10-CM | POA: Diagnosis not present

## 2015-07-11 DIAGNOSIS — R262 Difficulty in walking, not elsewhere classified: Secondary | ICD-10-CM | POA: Diagnosis not present

## 2015-07-11 DIAGNOSIS — M25562 Pain in left knee: Secondary | ICD-10-CM | POA: Diagnosis not present

## 2015-07-11 DIAGNOSIS — R5381 Other malaise: Secondary | ICD-10-CM | POA: Diagnosis not present

## 2015-07-12 ENCOUNTER — Encounter: Payer: Self-pay | Admitting: *Deleted

## 2015-07-12 DIAGNOSIS — M25562 Pain in left knee: Secondary | ICD-10-CM | POA: Diagnosis not present

## 2015-07-12 DIAGNOSIS — L97419 Non-pressure chronic ulcer of right heel and midfoot with unspecified severity: Secondary | ICD-10-CM | POA: Diagnosis not present

## 2015-07-12 DIAGNOSIS — R5381 Other malaise: Secondary | ICD-10-CM | POA: Diagnosis not present

## 2015-07-12 DIAGNOSIS — R262 Difficulty in walking, not elsewhere classified: Secondary | ICD-10-CM | POA: Diagnosis not present

## 2015-07-12 DIAGNOSIS — M6281 Muscle weakness (generalized): Secondary | ICD-10-CM | POA: Diagnosis not present

## 2015-07-13 ENCOUNTER — Other Ambulatory Visit: Payer: Self-pay | Admitting: *Deleted

## 2015-07-15 ENCOUNTER — Other Ambulatory Visit: Payer: Self-pay | Admitting: *Deleted

## 2015-07-15 ENCOUNTER — Encounter: Payer: Self-pay | Admitting: *Deleted

## 2015-07-15 NOTE — Patient Outreach (Signed)
[facility]) Care Management  [facility] Social Work  07/15/2015  Jasraj Lappe Pettijohn 1946-07-02 [id number]  Subjective:    "I have a plan for when I leave here but don't wish to discuss it until everything is finalized".  Objective:   [name] agreed to assist patient with possible discharge planning needs and services, upon release from [facility] [place] where patient currently resides for short-term rehabilitative services.  Current Medications:  Current Outpatient Prescriptions  Medication Sig Dispense Refill  . acetaminophen (TYLENOL) 325 MG tablet Take 2 tablets (650 mg total) by mouth every 4 (four) hours as needed for headache or mild pain.    Marland Kitchen aspirin EC 81 MG tablet Take 81 mg by mouth every morning.     . calcium carbonate (TUMS - DOSED IN MG ELEMENTAL CALCIUM) 500 MG chewable tablet Chew 1 tablet (200 mg of elemental calcium total) by mouth 3 (three) times daily as needed for indigestion or heartburn.    . diphenoxylate-atropine (LOMOTIL) 2.5-0.025 MG tablet Take 1 tablet by mouth 3 (three) times daily.  2  . ezetimibe (ZETIA) 10 MG tablet Take 1 tablet (10 mg total) by mouth daily. 30 tablet 11  . fexofenadine (ALLEGRA) 180 MG tablet Take 180 mg by mouth daily as needed for allergies or rhinitis.    . furosemide (LASIX) 40 MG tablet Take 1 tablet (40 mg total) by mouth daily. 30 tablet 11  . insulin aspart (NOVOLOG) 100 UNIT/ML injection Inject 0-9 Units into the skin 3 (three) times daily with meals. 10 mL 11  . insulin NPH Human (HUMULIN N) 100 UNIT/ML injection Inject 0.2 mLs (20 Units total) into the skin 2 (two) times daily at 8 am and 10 pm. 10 mL 11  . isosorbide mononitrate (IMDUR) 60 MG 24 hr tablet Take 1 tablet (60 mg total) by mouth daily.    Marland Kitchen lisinopril (PRINIVIL,ZESTRIL) 5 MG tablet Take 5 mg by mouth daily.  3  . losartan (COZAAR) 25 MG tablet Take 50 mg by mouth daily.    . metoprolol tartrate (LOPRESSOR) 25 MG  tablet Take 1 tablet (25 mg total) by mouth 2 (two) times daily. (Patient taking differently: Take 50 mg by mouth 2 (two) times daily. )    . nitroGLYCERIN (NITROSTAT) 0.4 MG SL tablet Place 1 tablet (0.4 mg total) under the tongue every 5 (five) minutes as needed for chest pain. 25 tablet 2  . omeprazole (PRILOSEC) 20 MG capsule Take 20 mg by mouth daily.    Marland Kitchen oxybutynin (DITROPAN) 5 MG tablet Take 5 mg by mouth daily as needed for bladder spasms.    . rosuvastatin (CRESTOR) 20 MG tablet Take 1 tablet (20 mg total) by mouth daily at 6 PM.    . senna-docusate (SENOKOT-S) 8.6-50 MG per tablet Take 2 tablets by mouth daily. (Patient taking differently: Take 2 tablets by mouth 2 (two) times daily as needed for moderate constipation. ) 28 tablet 0   No current facility-administered medications for this visit.    Functional Status:  In your present state of health, do you have any difficulty performing the following activities: 07/15/2015 07/01/2015  Hearing? [name]  Vision? Y Y  Difficulty concentrating or making decisions? [name]  Walking or climbing stairs? Y Y  Dressing or bathing? Y Y  Doing errands, shopping? [name]  Preparing Food and eating ? Y -  Using the Toilet? N -  In the past six months, have you accidently  leaked urine? N -  Do you have problems with loss of bowel control? N -  Managing your Medications? Y -  Managing your Finances? Y -  Housekeeping or managing your Housekeeping? Y -    Fall/Depression Screening:  PHQ 2/9 Scores 07/15/2015 05/20/2015  PHQ - 2 Score 2 0  PHQ- 9 Score 7 -    Assessment:   [name] was able to make initial contact with patient today when [name] met with patient at [facility] where patient currently resides to receive short-term rehabilitative services.  [name] was able to perform the initial assessment, as well as assess and assist with social work needs and services.  [name] introduced self, explained role and types of  services provided through [facility] ([facility]).  [name] further explained to patient that [name] has worked with patient in the past regarding placement arrangements; however, patient was unable to recall.  [name] explained the reason for the visit, indicating that [name], [facility], with whom patient met while at [facility], felt that patient would benefit from social work services and resources to assist with placement.  [name] obtained two HIPAA compliant identifiers from patient, which included patient's name and date of birth. Patient alluded to having a "plan in place" for when he is released from [facility], but refused to elaborate.  Patient reported that he wanted to make sure that his plans are confirmed before notifying [name] or staff involved with his care at the skilled nursing facility.  Patient is under the impression that he will be returning home to live with home health services in place and a private agency sitter arranged.  Patient stated, "I also have a friend that helps me out in the evenings".  [name] is aware of patient's friend, [name] who works a full-time job, 5 days per week, in a different city from where patient currently resides.  [name] has spoke with [name] in the past, who admits to providing minimal assistance to patient during the week.  [name] agreed to follow along to assist with possible discharge planning needs and services.  Plan:   [name] will contact patient on Monday, November 14th to follow-up regarding the need for continued social work involvement, as well as to assess and assist with possible discharge planning needs and services. [name] will fax a barriers letter and correspondence letter to patient's Primary Care Physician, [name]. [name] will prescribe and print [name] information to review with patient at the next scheduled home visit.  [name], BSW, MSW, LCSW  Licensed  Education officer, environmental Health System  Mailing [place] [address], [place], [place] [address] Physical Address-[address], [place], [place] [address] Toll Free Main # [phone] Fax # [phone] Cell # [phone]  Fax # [phone]  [name]

## 2015-07-18 DIAGNOSIS — R262 Difficulty in walking, not elsewhere classified: Secondary | ICD-10-CM | POA: Diagnosis not present

## 2015-07-18 DIAGNOSIS — R5381 Other malaise: Secondary | ICD-10-CM | POA: Diagnosis not present

## 2015-07-18 DIAGNOSIS — M6281 Muscle weakness (generalized): Secondary | ICD-10-CM | POA: Diagnosis not present

## 2015-07-18 DIAGNOSIS — M25562 Pain in left knee: Secondary | ICD-10-CM | POA: Diagnosis not present

## 2015-07-19 ENCOUNTER — Encounter: Payer: Medicare Other | Admitting: Physician Assistant

## 2015-07-19 DIAGNOSIS — L97419 Non-pressure chronic ulcer of right heel and midfoot with unspecified severity: Secondary | ICD-10-CM | POA: Diagnosis not present

## 2015-07-20 ENCOUNTER — Encounter: Payer: Self-pay | Admitting: Physician Assistant

## 2015-07-20 DIAGNOSIS — M1711 Unilateral primary osteoarthritis, right knee: Secondary | ICD-10-CM | POA: Diagnosis not present

## 2015-07-20 DIAGNOSIS — M1712 Unilateral primary osteoarthritis, left knee: Secondary | ICD-10-CM | POA: Diagnosis not present

## 2015-07-25 ENCOUNTER — Other Ambulatory Visit: Payer: Self-pay | Admitting: *Deleted

## 2015-07-25 NOTE — Patient Outreach (Signed)
[facility]) Care Management  07/25/2015  [name] [birth year] [id number]   [name] was able to make contact with patient today to follow-up regarding discharge planning needs and services upon release from [facility] [place] where patient currently resides to receive rehabilitative services.  Patient continues to maintain that he will be returning home to live with home health services and a private agency sitter upon release from the skilled nursing facility.  Patient indicated that he has confirmed with his friend, Gerald Stabs that he will be available to care for patient in the evenings and on weekends. Patient is hopeful that he will be able to return home within the next week to 10 days.  Patient reported that he is actively participating with therapies and continuing to gain strength and mobility.  Patient is agreeable to continuing therapies, both physical and occupational, upon returning home. Patient believes that he already has all the equipment he needs in the home, but is receptive to suggestions and recommendations made by his current therapists.  [name] will continue to follow along to assist with possible discharge planning needs and services.  No additional social work needs identified at present.    [name], BSW, MSW, LCSW  Licensed Education officer, environmental Health System  Mailing [place] [address], [place], [place] [address] Physical Address-[address], [place], [place] [address] Toll Free Main # [phone] Fax # [phone] Cell # [phone]  Fax # [phone]  [name]

## 2015-07-26 DIAGNOSIS — M25562 Pain in left knee: Secondary | ICD-10-CM | POA: Diagnosis not present

## 2015-07-26 DIAGNOSIS — R262 Difficulty in walking, not elsewhere classified: Secondary | ICD-10-CM | POA: Diagnosis not present

## 2015-07-26 DIAGNOSIS — M7542 Impingement syndrome of left shoulder: Secondary | ICD-10-CM | POA: Diagnosis not present

## 2015-07-26 DIAGNOSIS — R5381 Other malaise: Secondary | ICD-10-CM | POA: Diagnosis not present

## 2015-07-26 DIAGNOSIS — M6281 Muscle weakness (generalized): Secondary | ICD-10-CM | POA: Diagnosis not present

## 2015-07-27 DIAGNOSIS — M25562 Pain in left knee: Secondary | ICD-10-CM | POA: Diagnosis not present

## 2015-07-27 DIAGNOSIS — R262 Difficulty in walking, not elsewhere classified: Secondary | ICD-10-CM | POA: Diagnosis not present

## 2015-07-27 DIAGNOSIS — R5381 Other malaise: Secondary | ICD-10-CM | POA: Diagnosis not present

## 2015-07-27 DIAGNOSIS — M6281 Muscle weakness (generalized): Secondary | ICD-10-CM | POA: Diagnosis not present

## 2015-07-27 DIAGNOSIS — M7542 Impingement syndrome of left shoulder: Secondary | ICD-10-CM | POA: Diagnosis not present

## 2015-08-01 DIAGNOSIS — M25562 Pain in left knee: Secondary | ICD-10-CM | POA: Diagnosis not present

## 2015-08-01 DIAGNOSIS — M6281 Muscle weakness (generalized): Secondary | ICD-10-CM | POA: Diagnosis not present

## 2015-08-01 DIAGNOSIS — R5381 Other malaise: Secondary | ICD-10-CM | POA: Diagnosis not present

## 2015-08-01 DIAGNOSIS — R262 Difficulty in walking, not elsewhere classified: Secondary | ICD-10-CM | POA: Diagnosis not present

## 2015-08-03 ENCOUNTER — Other Ambulatory Visit: Payer: Self-pay | Admitting: *Deleted

## 2015-08-03 NOTE — Patient Outreach (Signed)
[facility]) Care Management  08/03/2015  Rafel Prindle Kargbo Apr 07, 1946 [id number]   [name] was able to make contact with patient today to follow-up with him regarding progress being made with therapies (both physical and occupational) while patient continues to reside at [facility] [place] where patient currently resides to receive short-term rehabilitative services.  Patient appeared to be in good spirits today, reporting that his friend/significant other, [name] was visiting him at the time of [name]'s call.  Patient admitted that he is ready to return home, but that he believes he has at least one more week before he will be released.  [name] was able to confirm with Harriet Pho, Admissions Director at [facility], that patient has a discharge planning meeting scheduled for Tuesday, November 29th at [place] agreed to make arrangements to attend the meeting to assist with possible discharge planning needs and services.  Patient and Mrs. Megan Salon were both agreeable to this plan.  At present, no social work needs have been identified, as patient's inpatient social worker at [facility] is in the process of arranging home care services and durable medical equipment.  Patient, in the presence of [name], reported that [name] will be able to provide care and supervision to patient in the evenings and on weekends.  Patient is in the process of arranging home care services during the daytime hours.  [name] will fax a correspondence letter to patient's Primary Care Physician, [name] to report findings of phone conversation with patient today.  [name], BSW, MSW, LCSW  Licensed Education officer, environmental Health System  Mailing [place] [address] [place], [place] [address] Physical Address-[address], [place], [place] [address] Toll Free Main #  [phone] Fax # [phone] Cell # [phone]  Fax # [phone]  [name]

## 2015-08-09 ENCOUNTER — Encounter: Payer: Self-pay | Admitting: *Deleted

## 2015-08-09 ENCOUNTER — Other Ambulatory Visit: Payer: Self-pay | Admitting: *Deleted

## 2015-08-09 NOTE — Patient Outreach (Signed)
[facility]) Care Management  08/09/2015  Carsin Randazzo Brindle 06-Jun-1946 [id number]     [name] was able to meet with patient today at [facility] [place] where patient currently resides to receive short-term rehabilitative services, to attend the discharge planning meeting.  Patient, along with multi-disciplinary staff from the skilled nursing facility, were all present during the meeting.  It was recommended that patient return home with home health services, which patient was agreeable too.  Patient indicated that he already has all the durable medical equipment that he needs in the home.  Patient reported that his friend, [name] will be available nights and weekends to offer assistance.  Patient further reported that his son, Othmar Ringer will be coming in for the holidays and staying with patient to ensure his safety and well-being in the home.  No additional social work needs identified at this time.    [name] will perform a case closure on patient, as all goals of treatment have been met from social work standpoint and no additional social work needs have been identified at this time. [name] will notify [facility] Liaison with [facility], also individual that made the referral, of [name]'s plans to close patient's case. [name] will fax a correspondence letter to patient's Primary Care Physician, Dr. Doree Albee to ensure that [name] is aware of patient's plans to return home to live independently upon release from the skilled nursing facility.   [name] will submit a case closure request to [name], Care Management Assistant with [facility], in the form of an [employer].    [name], BSW, MSW, LCSW  Licensed Education officer, environmental Health System  Mailing [place] [address] [place], [place] [address] Physical  Address-[address], [place], [place] [address] Toll Free Main # [phone] Fax # [phone] Cell # [phone]  Fax # [phone]  [name]

## 2015-08-10 DIAGNOSIS — R5381 Other malaise: Secondary | ICD-10-CM | POA: Diagnosis not present

## 2015-08-10 DIAGNOSIS — M6281 Muscle weakness (generalized): Secondary | ICD-10-CM | POA: Diagnosis not present

## 2015-08-10 DIAGNOSIS — M25562 Pain in left knee: Secondary | ICD-10-CM | POA: Diagnosis not present

## 2015-08-10 DIAGNOSIS — R262 Difficulty in walking, not elsewhere classified: Secondary | ICD-10-CM | POA: Diagnosis not present

## 2015-08-12 ENCOUNTER — Other Ambulatory Visit: Payer: Self-pay

## 2015-08-12 NOTE — Patient Outreach (Signed)
New referral- transition from [facility] to home: Placed  Transition of care call. No answer. Left a message requesting a call back. Provided my contact information.  PLAN: Will continue to outreach patient.  [name], RN, BSN, CEN [facility] [place] [employer] [phone]

## 2015-08-15 DIAGNOSIS — M6281 Muscle weakness (generalized): Secondary | ICD-10-CM | POA: Diagnosis not present

## 2015-08-15 DIAGNOSIS — R262 Difficulty in walking, not elsewhere classified: Secondary | ICD-10-CM | POA: Diagnosis not present

## 2015-08-15 DIAGNOSIS — M25562 Pain in left knee: Secondary | ICD-10-CM | POA: Diagnosis not present

## 2015-08-15 DIAGNOSIS — R5381 Other malaise: Secondary | ICD-10-CM | POA: Diagnosis not present

## 2015-08-16 DIAGNOSIS — L97419 Non-pressure chronic ulcer of right heel and midfoot with unspecified severity: Secondary | ICD-10-CM | POA: Diagnosis not present

## 2015-08-18 ENCOUNTER — Other Ambulatory Visit: Payer: Self-pay

## 2015-08-18 NOTE — Patient Outreach (Signed)
Care Coordination: Received message from Deitra Mayo that patient is still at [facility]. She will notify me when patient discharges.  [name], RN, BSN, CEN [facility] [employer] [phone]

## 2015-08-24 DIAGNOSIS — R262 Difficulty in walking, not elsewhere classified: Secondary | ICD-10-CM | POA: Diagnosis not present

## 2015-08-24 DIAGNOSIS — M25562 Pain in left knee: Secondary | ICD-10-CM | POA: Diagnosis not present

## 2015-08-24 DIAGNOSIS — M6281 Muscle weakness (generalized): Secondary | ICD-10-CM | POA: Diagnosis not present

## 2015-08-24 DIAGNOSIS — R5381 Other malaise: Secondary | ICD-10-CM | POA: Diagnosis not present

## 2015-08-29 ENCOUNTER — Other Ambulatory Visit: Payer: Self-pay

## 2015-08-29 NOTE — Patient Outreach (Signed)
Care Coordination: Placed call to patients home. No answer. Placed call to [facility].  I was informed that patient is still at SNF and should be discharging sometime this week.  PLAN: Sent a message to Deitra Mayo to inquire about discharge plan.  Will wait on notification of discharge and start transition of care program.  [name], RN, BSN, CEN [facility] Coordinator [phone]

## 2015-08-30 DIAGNOSIS — R5381 Other malaise: Secondary | ICD-10-CM | POA: Diagnosis not present

## 2015-08-30 DIAGNOSIS — M6281 Muscle weakness (generalized): Secondary | ICD-10-CM | POA: Diagnosis not present

## 2015-08-30 DIAGNOSIS — R262 Difficulty in walking, not elsewhere classified: Secondary | ICD-10-CM | POA: Diagnosis not present

## 2015-08-30 DIAGNOSIS — M25562 Pain in left knee: Secondary | ICD-10-CM | POA: Diagnosis not present

## 2015-09-06 DIAGNOSIS — N319 Neuromuscular dysfunction of bladder, unspecified: Secondary | ICD-10-CM | POA: Diagnosis not present

## 2015-09-06 DIAGNOSIS — R569 Unspecified convulsions: Secondary | ICD-10-CM | POA: Diagnosis not present

## 2015-09-06 DIAGNOSIS — I5032 Chronic diastolic (congestive) heart failure: Secondary | ICD-10-CM | POA: Diagnosis not present

## 2015-09-06 DIAGNOSIS — L989 Disorder of the skin and subcutaneous tissue, unspecified: Secondary | ICD-10-CM | POA: Diagnosis not present

## 2015-09-06 DIAGNOSIS — E1165 Type 2 diabetes mellitus with hyperglycemia: Secondary | ICD-10-CM | POA: Diagnosis not present

## 2015-09-06 DIAGNOSIS — L89612 Pressure ulcer of right heel, stage 2: Secondary | ICD-10-CM | POA: Diagnosis not present

## 2015-09-07 ENCOUNTER — Observation Stay (HOSPITAL_COMMUNITY): Payer: Medicare Other

## 2015-09-07 ENCOUNTER — Encounter (HOSPITAL_COMMUNITY): Payer: Self-pay | Admitting: Emergency Medicine

## 2015-09-07 ENCOUNTER — Inpatient Hospital Stay (HOSPITAL_COMMUNITY)
Admission: EM | Admit: 2015-09-07 | Discharge: 2015-09-15 | DRG: 699 | Disposition: A | Discharge status: Skilled Nursing Facility | Payer: Medicare Other | Attending: Internal Medicine | Admitting: Internal Medicine

## 2015-09-07 DIAGNOSIS — Y846 Urinary catheterization as the cause of abnormal reaction of the patient, or of later complication, without mention of misadventure at the time of the procedure: Secondary | ICD-10-CM | POA: Diagnosis present

## 2015-09-07 DIAGNOSIS — Z1612 Extended spectrum beta lactamase (ESBL) resistance: Secondary | ICD-10-CM | POA: Diagnosis present

## 2015-09-07 DIAGNOSIS — I2510 Atherosclerotic heart disease of native coronary artery without angina pectoris: Secondary | ICD-10-CM | POA: Diagnosis present

## 2015-09-07 DIAGNOSIS — R911 Solitary pulmonary nodule: Secondary | ICD-10-CM | POA: Diagnosis not present

## 2015-09-07 DIAGNOSIS — I493 Ventricular premature depolarization: Secondary | ICD-10-CM | POA: Diagnosis present

## 2015-09-07 DIAGNOSIS — E1140 Type 2 diabetes mellitus with diabetic neuropathy, unspecified: Secondary | ICD-10-CM | POA: Diagnosis not present

## 2015-09-07 DIAGNOSIS — B9689 Other specified bacterial agents as the cause of diseases classified elsewhere: Secondary | ICD-10-CM | POA: Diagnosis present

## 2015-09-07 DIAGNOSIS — Z888 Allergy status to other drugs, medicaments and biological substances status: Secondary | ICD-10-CM

## 2015-09-07 DIAGNOSIS — Z8249 Family history of ischemic heart disease and other diseases of the circulatory system: Secondary | ICD-10-CM

## 2015-09-07 DIAGNOSIS — E1159 Type 2 diabetes mellitus with other circulatory complications: Secondary | ICD-10-CM | POA: Diagnosis not present

## 2015-09-07 DIAGNOSIS — Z833 Family history of diabetes mellitus: Secondary | ICD-10-CM

## 2015-09-07 DIAGNOSIS — IMO0002 Reserved for concepts with insufficient information to code with codable children: Secondary | ICD-10-CM | POA: Diagnosis present

## 2015-09-07 DIAGNOSIS — I455 Other specified heart block: Secondary | ICD-10-CM | POA: Diagnosis not present

## 2015-09-07 DIAGNOSIS — M1990 Unspecified osteoarthritis, unspecified site: Secondary | ICD-10-CM | POA: Diagnosis present

## 2015-09-07 DIAGNOSIS — G8929 Other chronic pain: Secondary | ICD-10-CM | POA: Diagnosis present

## 2015-09-07 DIAGNOSIS — Z79899 Other long term (current) drug therapy: Secondary | ICD-10-CM

## 2015-09-07 DIAGNOSIS — G4733 Obstructive sleep apnea (adult) (pediatric): Secondary | ICD-10-CM | POA: Diagnosis present

## 2015-09-07 DIAGNOSIS — I10 Essential (primary) hypertension: Secondary | ICD-10-CM | POA: Diagnosis present

## 2015-09-07 DIAGNOSIS — Z794 Long term (current) use of insulin: Secondary | ICD-10-CM

## 2015-09-07 DIAGNOSIS — I472 Ventricular tachycardia: Secondary | ICD-10-CM

## 2015-09-07 DIAGNOSIS — T83511A Infection and inflammatory reaction due to indwelling urethral catheter, initial encounter: Secondary | ICD-10-CM | POA: Diagnosis not present

## 2015-09-07 DIAGNOSIS — R05 Cough: Secondary | ICD-10-CM | POA: Diagnosis not present

## 2015-09-07 DIAGNOSIS — L309 Dermatitis, unspecified: Secondary | ICD-10-CM | POA: Diagnosis present

## 2015-09-07 DIAGNOSIS — Z8701 Personal history of pneumonia (recurrent): Secondary | ICD-10-CM

## 2015-09-07 DIAGNOSIS — I4729 Other ventricular tachycardia: Secondary | ICD-10-CM

## 2015-09-07 DIAGNOSIS — M545 Low back pain: Secondary | ICD-10-CM | POA: Diagnosis present

## 2015-09-07 DIAGNOSIS — B961 Klebsiella pneumoniae [K. pneumoniae] as the cause of diseases classified elsewhere: Secondary | ICD-10-CM | POA: Diagnosis present

## 2015-09-07 DIAGNOSIS — N390 Urinary tract infection, site not specified: Secondary | ICD-10-CM | POA: Diagnosis present

## 2015-09-07 DIAGNOSIS — E1143 Type 2 diabetes mellitus with diabetic autonomic (poly)neuropathy: Secondary | ICD-10-CM | POA: Diagnosis present

## 2015-09-07 DIAGNOSIS — R0600 Dyspnea, unspecified: Secondary | ICD-10-CM

## 2015-09-07 DIAGNOSIS — Z87891 Personal history of nicotine dependence: Secondary | ICD-10-CM

## 2015-09-07 DIAGNOSIS — E1165 Type 2 diabetes mellitus with hyperglycemia: Secondary | ICD-10-CM | POA: Diagnosis present

## 2015-09-07 DIAGNOSIS — H538 Other visual disturbances: Secondary | ICD-10-CM | POA: Diagnosis present

## 2015-09-07 DIAGNOSIS — E871 Hypo-osmolality and hyponatremia: Secondary | ICD-10-CM | POA: Diagnosis not present

## 2015-09-07 DIAGNOSIS — Z960 Presence of urogenital implants: Secondary | ICD-10-CM

## 2015-09-07 DIAGNOSIS — L03314 Cellulitis of groin: Secondary | ICD-10-CM | POA: Diagnosis present

## 2015-09-07 DIAGNOSIS — Z9111 Patient's noncompliance with dietary regimen: Secondary | ICD-10-CM

## 2015-09-07 DIAGNOSIS — N319 Neuromuscular dysfunction of bladder, unspecified: Secondary | ICD-10-CM | POA: Diagnosis present

## 2015-09-07 DIAGNOSIS — E1151 Type 2 diabetes mellitus with diabetic peripheral angiopathy without gangrene: Secondary | ICD-10-CM | POA: Diagnosis present

## 2015-09-07 DIAGNOSIS — Z806 Family history of leukemia: Secondary | ICD-10-CM

## 2015-09-07 DIAGNOSIS — Z87440 Personal history of urinary (tract) infections: Secondary | ICD-10-CM

## 2015-09-07 DIAGNOSIS — I441 Atrioventricular block, second degree: Secondary | ICD-10-CM | POA: Diagnosis not present

## 2015-09-07 DIAGNOSIS — R109 Unspecified abdominal pain: Secondary | ICD-10-CM | POA: Diagnosis not present

## 2015-09-07 DIAGNOSIS — Z7982 Long term (current) use of aspirin: Secondary | ICD-10-CM

## 2015-09-07 DIAGNOSIS — L89612 Pressure ulcer of right heel, stage 2: Secondary | ICD-10-CM | POA: Diagnosis present

## 2015-09-07 DIAGNOSIS — E785 Hyperlipidemia, unspecified: Secondary | ICD-10-CM | POA: Diagnosis present

## 2015-09-07 DIAGNOSIS — Z881 Allergy status to other antibiotic agents status: Secondary | ICD-10-CM

## 2015-09-07 DIAGNOSIS — K219 Gastro-esophageal reflux disease without esophagitis: Secondary | ICD-10-CM | POA: Diagnosis present

## 2015-09-07 DIAGNOSIS — Z978 Presence of other specified devices: Secondary | ICD-10-CM

## 2015-09-07 DIAGNOSIS — Z6841 Body Mass Index (BMI) 40.0 and over, adult: Secondary | ICD-10-CM

## 2015-09-07 DIAGNOSIS — I5032 Chronic diastolic (congestive) heart failure: Secondary | ICD-10-CM | POA: Diagnosis not present

## 2015-09-07 DIAGNOSIS — Z8673 Personal history of transient ischemic attack (TIA), and cerebral infarction without residual deficits: Secondary | ICD-10-CM

## 2015-09-07 DIAGNOSIS — Z96651 Presence of right artificial knee joint: Secondary | ICD-10-CM | POA: Diagnosis present

## 2015-09-07 DIAGNOSIS — Z885 Allergy status to narcotic agent status: Secondary | ICD-10-CM

## 2015-09-07 DIAGNOSIS — Z8043 Family history of malignant neoplasm of testis: Secondary | ICD-10-CM

## 2015-09-07 DIAGNOSIS — B372 Candidiasis of skin and nail: Secondary | ICD-10-CM | POA: Diagnosis present

## 2015-09-07 LAB — URINE MICROSCOPIC-ADD ON

## 2015-09-07 LAB — URINALYSIS, ROUTINE W REFLEX MICROSCOPIC
BILIRUBIN URINE: NEGATIVE
Ketones, ur: NEGATIVE mg/dL
Nitrite: NEGATIVE
Protein, ur: 100 mg/dL — AB
SPECIFIC GRAVITY, URINE: 1.023 (ref 1.005–1.030)
pH: 6 (ref 5.0–8.0)

## 2015-09-07 LAB — TROPONIN I
Troponin I: <0.03 ng/mL (ref <0.031)
Troponin I: <0.03 ng/mL (ref <0.031)

## 2015-09-07 LAB — CBC
HCT: 38 % — ABNORMAL LOW (ref 39.0–52.0)
HEMATOCRIT: 41.2 % (ref 39.0–52.0)
Hemoglobin: 13.9 g/dL (ref 13.0–17.0)
Hemoglobin: 12.9 g/dL — ABNORMAL LOW (ref 13.0–17.0)
MCH: 30.7 pg (ref 26.0–34.0)
MCH: 30.9 pg (ref 26.0–34.0)
MCHC: 33.7 g/dL (ref 30.0–36.0)
MCHC: 33.9 g/dL (ref 30.0–36.0)
MCV: 90.9 fL (ref 78.0–100.0)
MCV: 90.9 fL (ref 78.0–100.0)
PLATELETS: 231 10*3/uL (ref 150–400)
PLATELETS: 208 10*3/uL (ref 150–400)
RBC: 4.53 MIL/uL (ref 4.22–5.81)
RBC: 4.18 MIL/uL — ABNORMAL LOW (ref 4.22–5.81)
RDW: 13.2 % (ref 11.5–15.5)
RDW: 13.1 % (ref 11.5–15.5)
WBC: 12.1 10*3/uL — ABNORMAL HIGH (ref 4.0–10.5)
WBC: 10.3 10*3/uL (ref 4.0–10.5)

## 2015-09-07 LAB — COMPREHENSIVE METABOLIC PANEL
ALBUMIN: 3.1 g/dL — AB (ref 3.5–5.0)
ALT: 12 U/L — AB (ref 17–63)
AST: 11 U/L — AB (ref 15–41)
Alkaline Phosphatase: 116 U/L (ref 38–126)
Anion gap: 8 (ref 5–15)
BILIRUBIN TOTAL: 0.9 mg/dL (ref 0.3–1.2)
BUN: 12 mg/dL (ref 6–20)
CHLORIDE: 98 mmol/L — AB (ref 101–111)
CO2: 28 mmol/L (ref 22–32)
CREATININE: 0.67 mg/dL (ref 0.61–1.24)
Calcium: 8.8 mg/dL — ABNORMAL LOW (ref 8.9–10.3)
GFR calc Af Amer: >60 mL/min (ref >60)
GFR calc non Af Amer: >60 mL/min (ref >60)
GLUCOSE: 356 mg/dL — AB (ref 65–99)
POTASSIUM: 4.3 mmol/L (ref 3.5–5.1)
Sodium: 134 mmol/L — ABNORMAL LOW (ref 135–145)
Total Protein: 7 g/dL (ref 6.5–8.1)

## 2015-09-07 LAB — CREATININE, SERUM
CREATININE: 0.62 mg/dL (ref 0.61–1.24)
GFR calc Af Amer: >60 mL/min (ref >60)
GFR calc non Af Amer: >60 mL/min (ref >60)

## 2015-09-07 LAB — CBG MONITORING, ED: Glucose-Capillary: 328 mg/dL — ABNORMAL HIGH (ref 65–99)

## 2015-09-07 LAB — LIPASE, BLOOD: LIPASE: 22 U/L (ref 11–51)

## 2015-09-07 MED ORDER — OXYCODONE-ACETAMINOPHEN 5-325 MG PO TABS
1.0000 | ORAL_TABLET | Freq: Four times a day (QID) | ORAL | Status: DC | PRN
  Administered 2015-09-07 – 2015-09-15 (×18): 1 via ORAL
  Filled 2015-09-07 (×19): qty 1

## 2015-09-07 MED ORDER — INSULIN NPH (HUMAN) (ISOPHANE) 100 UNIT/ML ~~LOC~~ SUSP
20.0000 [IU] | Freq: Two times a day (BID) | SUBCUTANEOUS | Status: DC
  Administered 2015-09-07 – 2015-09-09 (×4): 20 [IU] via SUBCUTANEOUS
  Filled 2015-09-07 (×2): qty 10

## 2015-09-07 MED ORDER — METOPROLOL TARTRATE 50 MG PO TABS
50.0000 mg | ORAL_TABLET | Freq: Two times a day (BID) | ORAL | Status: DC
  Administered 2015-09-07 – 2015-09-14 (×14): 50 mg via ORAL
  Filled 2015-09-07 (×14): qty 1

## 2015-09-07 MED ORDER — ASPIRIN EC 81 MG PO TBEC
81.0000 mg | DELAYED_RELEASE_TABLET | Freq: Every morning | ORAL | Status: DC
  Administered 2015-09-08 – 2015-09-15 (×8): 81 mg via ORAL
  Filled 2015-09-07 (×8): qty 1

## 2015-09-07 MED ORDER — DIPHENHYDRAMINE HCL 50 MG/ML IJ SOLN
12.5000 mg | Freq: Four times a day (QID) | INTRAMUSCULAR | Status: DC | PRN
  Administered 2015-09-08 – 2015-09-09 (×2): 12.5 mg via INTRAVENOUS
  Filled 2015-09-07 (×3): qty 1

## 2015-09-07 MED ORDER — LORATADINE 10 MG PO TABS
10.0000 mg | ORAL_TABLET | Freq: Every day | ORAL | Status: DC
  Administered 2015-09-07 – 2015-09-15 (×9): 10 mg via ORAL
  Filled 2015-09-07 (×9): qty 1

## 2015-09-07 MED ORDER — SODIUM CHLORIDE 0.9 % IV BOLUS (SEPSIS)
1000.0000 mL | Freq: Once | INTRAVENOUS | Status: AC
  Administered 2015-09-07: 1000 mL via INTRAVENOUS

## 2015-09-07 MED ORDER — NITROGLYCERIN 0.4 MG SL SUBL
0.4000 mg | SUBLINGUAL_TABLET | SUBLINGUAL | Status: DC | PRN
Start: 2015-09-07 — End: 2015-09-15

## 2015-09-07 MED ORDER — CETYLPYRIDINIUM CHLORIDE 0.05 % MT LIQD
7.0000 mL | Freq: Two times a day (BID) | OROMUCOSAL | Status: DC
  Administered 2015-09-07 – 2015-09-15 (×13): 7 mL via OROMUCOSAL

## 2015-09-07 MED ORDER — DIPHENOXYLATE-ATROPINE 2.5-0.025 MG PO TABS: 1.0000 | ORAL_TABLET | Freq: Three times a day (TID) | ORAL | Status: DC | PRN

## 2015-09-07 MED ORDER — CEFEPIME HCL 1 G IJ SOLR
1.0000 g | Freq: Three times a day (TID) | INTRAMUSCULAR | Status: DC
  Administered 2015-09-07 – 2015-09-10 (×10): 1 g via INTRAVENOUS
  Filled 2015-09-07 (×12): qty 1

## 2015-09-07 MED ORDER — LOSARTAN POTASSIUM 50 MG PO TABS: 50.0000 mg | ORAL_TABLET | Freq: Every day | ORAL | Status: DC

## 2015-09-07 MED ORDER — DEXTROSE 5 % IV SOLN: 1.0000 g | INTRAVENOUS | Status: DC

## 2015-09-07 MED ORDER — CHLORHEXIDINE GLUCONATE 0.12 % MT SOLN
15.0000 mL | Freq: Two times a day (BID) | OROMUCOSAL | Status: DC
  Administered 2015-09-07 – 2015-09-15 (×11): 15 mL via OROMUCOSAL
  Filled 2015-09-07 (×12): qty 15

## 2015-09-07 MED ORDER — INSULIN ASPART 100 UNIT/ML ~~LOC~~ SOLN
0.0000 [IU] | Freq: Three times a day (TID) | SUBCUTANEOUS | Status: DC
  Administered 2015-09-08 (×2): 3 [IU] via SUBCUTANEOUS

## 2015-09-07 MED ORDER — LISINOPRIL 10 MG PO TABS
5.0000 mg | ORAL_TABLET | Freq: Every day | ORAL | Status: DC
  Administered 2015-09-07 – 2015-09-15 (×9): 5 mg via ORAL
  Filled 2015-09-07 (×10): qty 1

## 2015-09-07 MED ORDER — PANTOPRAZOLE SODIUM 40 MG PO TBEC
40.0000 mg | DELAYED_RELEASE_TABLET | Freq: Every day | ORAL | Status: DC
  Administered 2015-09-07 – 2015-09-15 (×9): 40 mg via ORAL
  Filled 2015-09-07 (×9): qty 1

## 2015-09-07 MED ORDER — OXYBUTYNIN CHLORIDE 5 MG PO TABS: 5.0000 mg | ORAL_TABLET | Freq: Every day | ORAL | Status: DC | PRN

## 2015-09-07 MED ORDER — EZETIMIBE 10 MG PO TABS
10.0000 mg | ORAL_TABLET | Freq: Every day | ORAL | Status: DC
  Administered 2015-09-07 – 2015-09-15 (×9): 10 mg via ORAL
  Filled 2015-09-07 (×9): qty 1

## 2015-09-07 MED ORDER — FLUCONAZOLE IN SODIUM CHLORIDE 100-0.9 MG/50ML-% IV SOLN
100.0000 mg | INTRAVENOUS | Status: DC
  Administered 2015-09-07 – 2015-09-11 (×5): 100 mg via INTRAVENOUS
  Filled 2015-09-07 (×6): qty 50

## 2015-09-07 MED ORDER — SODIUM CHLORIDE 0.9 % IV SOLN
INTRAVENOUS | Status: DC
  Administered 2015-09-07: 17:00:00 via INTRAVENOUS

## 2015-09-07 MED ORDER — ACETAMINOPHEN 325 MG PO TABS: 650.0000 mg | ORAL_TABLET | ORAL | Status: DC | PRN

## 2015-09-07 MED ORDER — SODIUM CHLORIDE 0.9 % IV SOLN: INTRAVENOUS | Status: DC

## 2015-09-07 MED ORDER — ENOXAPARIN SODIUM 40 MG/0.4ML ~~LOC~~ SOLN
40.0000 mg | SUBCUTANEOUS | Status: DC
  Administered 2015-09-07 – 2015-09-08 (×2): 40 mg via SUBCUTANEOUS
  Filled 2015-09-07 (×2): qty 0.4

## 2015-09-07 MED ORDER — CALCIUM CARBONATE ANTACID 500 MG PO CHEW: 1.0000 | CHEWABLE_TABLET | Freq: Three times a day (TID) | ORAL | Status: DC | PRN

## 2015-09-07 MED ORDER — FUROSEMIDE 40 MG PO TABS
40.0000 mg | ORAL_TABLET | Freq: Every day | ORAL | Status: DC
  Administered 2015-09-07 – 2015-09-15 (×9): 40 mg via ORAL
  Filled 2015-09-07 (×9): qty 1

## 2015-09-07 MED ORDER — CIPROFLOXACIN IN D5W 400 MG/200ML IV SOLN
400.0000 mg | Freq: Once | INTRAVENOUS | Status: AC
  Administered 2015-09-07: 400 mg via INTRAVENOUS
  Filled 2015-09-07: qty 200

## 2015-09-07 MED ORDER — ISOSORBIDE MONONITRATE ER 30 MG PO TB24
60.0000 mg | ORAL_TABLET | Freq: Every day | ORAL | Status: DC
  Administered 2015-09-07 – 2015-09-15 (×9): 60 mg via ORAL
  Filled 2015-09-07 (×2): qty 1
  Filled 2015-09-07 (×2): qty 2
  Filled 2015-09-07 (×6): qty 1

## 2015-09-07 MED ORDER — ROSUVASTATIN CALCIUM 20 MG PO TABS
20.0000 mg | ORAL_TABLET | Freq: Every day | ORAL | Status: DC
  Administered 2015-09-07 – 2015-09-15 (×9): 20 mg via ORAL
  Filled 2015-09-07: qty 1
  Filled 2015-09-07: qty 2
  Filled 2015-09-07 (×2): qty 1
  Filled 2015-09-07: qty 2
  Filled 2015-09-07 (×2): qty 1
  Filled 2015-09-07: qty 2
  Filled 2015-09-07: qty 1
  Filled 2015-09-07: qty 2
  Filled 2015-09-07: qty 1
  Filled 2015-09-07: qty 2
  Filled 2015-09-07 (×2): qty 1

## 2015-09-07 NOTE — ED Notes (Addendum)
Pt from home. Reports midline lower abd pain since yesterday. Pt also has cloudy urine in foley bag and pt reports passing pus in his urine. Pt also has redness in his groin area. Denies v/d. Pt also reports he has had blurry vision since yesterday. Pt reports this is recurrent. Hx DM

## 2015-09-07 NOTE — Progress Notes (Signed)
Pt reported cough with "pinkish" phlegm. None noted. MD made aware. Said to not place on airborne precaution at the moment; but she would order a chest x-ray for pt. Vwilliams,rn.

## 2015-09-07 NOTE — ED Notes (Signed)
Delay in UA collect, pt needs 18 fr cath

## 2015-09-07 NOTE — ED Provider Notes (Signed)
CSN: [id number]     Arrival date & time 09/07/15  1021 History   First MD Initiated Contact with Patient 09/07/15 1103     Chief Complaint  Patient presents with  . Abdominal Pain  . Blurred Vision     (Consider location/radiation/quality/duration/timing/severity/associated sxs/prior Treatment) HPI.....Marland Kitchen level V caveat for urgent need for intervention.. Patient presents with pus coming from his urinary catheter and cloudy urine. He is morbidly obese, diabetic, and multiple other health problems. He lives alone. No fever, sweats, shaking chills, chest pain, dyspnea. He has been taking his medications. Primary care Drs. Lavone Orn  Past Medical History  Diagnosis Date  . Obese   . Hypertension   . Diabetic neuropathy, painful ([place])   . Chronic indwelling Foley catheter   . Bladder spasms   . Hyperlipidemia   . GERD (gastroesophageal reflux disease)   . Neurogenic bladder   . CAD (coronary artery disease)   . PONV (postoperative nausea and vomiting)   . CHF (congestive heart failure) ([place])   . DVT (deep venous thrombosis) ([facility]) 1980's    LLE  . Type II diabetes mellitus ([place])   . Diabetic diarrhea ([place])   . Pneumonia 1999  . OSA (obstructive sleep apnea)     "they wanted me to wear a mask; I couldn't" (11/10/2014)  . Arthritis     "hands, ankles, knees" (11/10/2014)  . Chronic lower back pain   . Stroke [facility]) 10/2011; 06/2014    right hand numbness/notes 10/20/2011, pt not sure he had stroke in 2013; "right hand weaker and right face not quite right since" (11/10/2014)   Past Surgical History  Procedure Laterality Date  . Gastroplasty    . Total knee arthroplasty Right 1990's  . Left heart catheterization with coronary angiogram N/A 10/24/2011    Procedure: LEFT HEART CATHETERIZATION WITH CORONARY ANGIOGRAM;  Surgeon: Candee Furbish, MD;  Location: [facility] CATH LAB;  Service: Cardiovascular;  Laterality: N/A;  right radial artery approach  . Cholecystectomy open  1970's?  . Joint  replacement    . Carpal tunnel release Bilateral ~ 2003-2004  . Cardiac catheterization N/A 05/02/2015    Procedure: Left Heart Cath and Coronary Angiography;  Surgeon: Lorretta Harp, MD;  Location: [facility] CV LAB;  Service: Cardiovascular;  Laterality: N/A;  . Cardiac catheterization N/A 05/04/2015    Procedure: Left Heart Cath and Coronary Angiography;  Surgeon: Wellington Hampshire, MD;  Location: [facility] CV LAB;  Service: Cardiovascular;  Laterality: N/A;   Family History  Problem Relation Age of Onset  . Hypertension Mother   . Diabetes type I Father   . Cancer Brother      Testicular Cancer  . Cancer Brother     Leukemia   Social History  Substance Use Topics  . Smoking status: Former Smoker -- 2.00 packs/day for 20 years    Types: Cigarettes    Quit date: 09/10/1976  . Smokeless tobacco: Never Used  . 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      Allergies  Ace inhibitors; Lipitor; Metformin and related; Cefadroxil; Cephalexin; Morphine and related; and Robaxin  Home Medications   Prior to Admission medications   Medication Sig Start Date End Date Taking? Authorizing Provider  acetaminophen (TYLENOL) 325 MG tablet Take 2 tablets (650 mg total) by mouth every 4 (four) hours as needed for headache or  mild pain. 12/08/14  Yes Erlene Quan, PA-C  aspirin EC 81 MG tablet Take 81 mg by mouth every morning.    Yes Historical Provider, MD  calcium carbonate (TUMS - DOSED IN MG ELEMENTAL CALCIUM) 500 MG chewable tablet Chew 1 tablet (200 mg of elemental calcium total) by mouth 3 (three) times daily as needed for indigestion or heartburn. 11/19/14  Yes Debbe Odea, MD  diphenoxylate-atropine (LOMOTIL) 2.5-0.025 MG tablet Take 1 tablet by mouth 3 (three) times daily as needed for diarrhea or loose stools.  06/14/15  Yes Historical Provider, MD  ezetimibe (ZETIA)  10 MG tablet Take 1 tablet (10 mg total) by mouth daily. 05/06/15  Yes Rhonda G Barrett, PA-C  fexofenadine (ALLEGRA) 180 MG tablet Take 180 mg by mouth daily as needed for allergies or rhinitis.   Yes Historical Provider, MD  furosemide (LASIX) 40 MG tablet Take 1 tablet (40 mg total) by mouth daily. 04/03/14  Yes Lavone Orn, MD  insulin NPH Human (HUMULIN N) 100 UNIT/ML injection Inject 0.2 mLs (20 Units total) into the skin 2 (two) times daily at 8 am and 10 pm. 04/18/15  Yes Kelvin Cellar, MD  isosorbide mononitrate (IMDUR) 60 MG 24 hr tablet Take 1 tablet (60 mg total) by mouth daily. 07/06/15  Yes Hosie Poisson, MD  lisinopril (PRINIVIL,ZESTRIL) 5 MG tablet Take 5 mg by mouth daily. 06/23/15  Yes Historical Provider, MD  metoprolol (LOPRESSOR) 50 MG tablet Take 50 mg by mouth 2 (two) times daily.   Yes Historical Provider, MD  nitroGLYCERIN (NITROSTAT) 0.4 MG SL tablet Place 1 tablet (0.4 mg total) under the tongue every 5 (five) minutes as needed for chest pain. 12/08/14  Yes Luke K Kilroy, PA-C  omeprazole (PRILOSEC) 20 MG capsule Take 20 mg by mouth daily.   Yes Historical Provider, MD  oxybutynin (DITROPAN) 5 MG tablet Take 5 mg by mouth daily as needed for bladder spasms.   Yes Historical Provider, MD  rosuvastatin (CRESTOR) 20 MG tablet Take 1 tablet (20 mg total) by mouth daily at 6 PM. 07/06/15  Yes Hosie Poisson, MD  senna-docusate (SENOKOT-S) 8.6-50 MG per tablet Take 2 tablets by mouth daily. Patient taking differently: Take 2 tablets by mouth 2 (two) times daily as needed for moderate constipation.  05/11/15  Yes Carmin Muskrat, MD  insulin aspart (NOVOLOG) 100 UNIT/ML injection Inject 0-9 Units into the skin 3 (three) times daily with meals. Patient not taking: Reported on 09/07/2015 07/06/15   Hosie Poisson, MD  metoprolol tartrate (LOPRESSOR) 25 MG tablet Take 1 tablet (25 mg total) by mouth 2 (two) times daily. Patient not taking: Reported on 09/07/2015 07/06/15   Hosie Poisson, MD    BP 168/84 mmHg  Pulse 93  Temp(Src) 98.3 F (36.8 C) (Oral)  Resp 18  SpO2 98% Physical Exam  Constitutional: He is oriented to person, place, and time.  obese  HENT:  Head: Normocephalic and atraumatic.  Eyes: Conjunctivae and EOM are normal. Pupils are equal, round, and reactive to light.  Neck: Normal range of motion. Neck supple.  Cardiovascular: Normal rate and regular rhythm.   Pulmonary/Chest: Effort normal and breath sounds normal.  Abdominal: Soft. Bowel sounds are normal.  Genitourinary:  Obvious pus extruding from urethral meatus  Musculoskeletal: Normal range of motion.  Neurological: He is alert and oriented to person, place, and time.  Skin: Skin is warm and dry.  Psychiatric: He has a normal mood and affect. His behavior is normal.  Nursing note and  vitals reviewed.   ED Course  Procedures (including critical care time) Labs Review Labs Reviewed  COMPREHENSIVE METABOLIC PANEL - Abnormal; Notable for the following:    Sodium 134 (*)    Chloride 98 (*)    Glucose, Bld 356 (*)    Calcium 8.8 (*)    Albumin 3.1 (*)    AST 11 (*)    ALT 12 (*)    All other components within normal limits  CBC - Abnormal; Notable for the following:    WBC 12.1 (*)    All other components within normal limits  URINALYSIS, ROUTINE W REFLEX MICROSCOPIC (NOT AT [facility]) - Abnormal; Notable for the following:    APPearance CLOUDY (*)    Glucose, UA >1000 (*)    Hgb urine dipstick MODERATE (*)    Protein, ur 100 (*)    Leukocytes, UA SMALL (*)    All other components within normal limits  URINE MICROSCOPIC-ADD ON - Abnormal; Notable for the following:    Squamous Epithelial / LPF 0-5 (*)    Bacteria, UA MANY (*)    All other components within normal limits  URINE CULTURE  LIPASE, BLOOD    Imaging Review No results found. I have personally reviewed and evaluated these images and lab results as part of my medical decision-making.   EKG Interpretation None      MDM    Final diagnoses:  UTI (lower urinary tract infection)  Type II diabetes mellitus with peripheral circulatory disorder, uncontrolled ([place])    Patient has pus in his urine. He has multiple health problems. He lives by himself.  Glucose is elevated at 356. IV Rocephin. Admit to general medicine.    [name], MD 09/07/15 [phone]

## 2015-09-07 NOTE — Progress Notes (Signed)
ED CM notified [name] that pt is being admitted to follow d/c needs

## 2015-09-07 NOTE — ED Notes (Signed)
Bed: [id number] Expected date:  Expected time:  Means of arrival:  Comments: EMS- 69yo M, abdominal pain/catheter issue

## 2015-09-07 NOTE — H&P (Addendum)
[facility] History and Physical  [name] [id number] DOB: 1946-03-07 DOA: 09/07/2015  Referring physician: ED physician, Dr. Lacinda Axon  PCP: Irven Shelling, MD   Chief Complaint: discharge from urinary cath site   HPI: Pt is 69 yo male with DM type II, HTN, PVD, chronic indwelling foley catheter, presented to [facility] ED with main concern of several days duration of pus in the foley catheter. Pt reports recurrent UTI's with different bacterias present and requiring multiple ABX for treatment. Pt also reports noticing productive cough of yellow sputum, no fevers, chills, chest pain or shortness of breath, no abd concern.   In ED, pt noted to be hemodynamically stable, blood work notable for WBC 12K, Na 134. Pt started on Rocephin. TRH asked to admit for further evaluation.   Assessment and Plan: Active Problems: Neurogenic bladder with pus around the foley cath with cellulitis in the groin and testicular area  - in pt with chronic indwelling cath and recurrent UTI's, ESBL, Pseudomonas - place on Maxipime for now, as pt is diabetic will add fungal coverage  - follow up on urine culture   Productive cough - CT chest requested for clearer evaluation - pt already on ABX  - sputum culture requested   DM type II with complications of PVD, neuropathy - continue home regimen with insulin - add SSI   Right heel pressure ulcer - wound care team consulted   Chronic diastolic CHF - continue home regimen with Lasix - weight is 158 kg on admission - monitor daily weights, strict I/O  HLD - continue zetia   HTN, essential - continue home medical regimen   Morbid obesity  - BMI > 40   DVT prophylaxis - Lovenox SQ  Radiological Exams on Admission: No results found.   Code Status: Full Family Communication: Pt at bedside Disposition Plan: Admit for further evaluation    Mart Piggs [facility] [id number]   Review of Systems:  Constitutional: Negative for fever, chills.  Negative for diaphoresis.  HENT: Negative for hearing loss, ear pain, nosebleeds, congestion, sore throat, neck pain, tinnitus and ear discharge.   Eyes: Negative for blurred vision, double vision, photophobia, pain, discharge and redness.  Respiratory: Negative for cough, hemoptysis, sputum production, shortness of breath, wheezing and stridor.   Cardiovascular: Negative for chest pain, palpitations, orthopnea, claudication and leg swelling.  Gastrointestinal: Negative for nausea, vomiting and abdominal pain.  Genitourinary: Negative for dysuria, urgency, frequency, hematuria and flank pain.  Musculoskeletal: Negative for myalgias, back pain, joint pain and falls.  Skin: Negative for itching and rash.  Neurological: Negative for dizziness and weakness.  Endo/Heme/Allergies: Negative for environmental allergies and polydipsia. Does not bruise/bleed easily.  Psychiatric/Behavioral: Negative for suicidal ideas. The patient is not nervous/anxious.      Past Medical History  Diagnosis Date  . Obese   . Hypertension   . Diabetic neuropathy, painful ([place])   . Chronic indwelling Foley catheter   . Bladder spasms   . Hyperlipidemia   . GERD (gastroesophageal reflux disease)   . Neurogenic bladder   . CAD (coronary artery disease)   . PONV (postoperative nausea and vomiting)   . CHF (congestive heart failure) ([place])   . DVT (deep venous thrombosis) ([facility]) 1980's    LLE  . Type II diabetes mellitus ([place])   . Diabetic diarrhea ([place])   . Pneumonia 1999  . OSA (obstructive sleep apnea)     "they wanted me to wear a mask; I couldn't" (11/10/2014)  . Arthritis     "  hands, ankles, knees" (11/10/2014)  . Chronic lower back pain   . Stroke [facility]) 10/2011; 06/2014    right hand numbness/notes 10/20/2011, pt not sure he had stroke in 2013; "right hand weaker and right face not quite right since" (11/10/2014)    Past Surgical History  Procedure Laterality Date  . Gastroplasty    . Total knee arthroplasty  Right 1990's  . Left heart catheterization with coronary angiogram N/A 10/24/2011    Procedure: LEFT HEART CATHETERIZATION WITH CORONARY ANGIOGRAM;  Surgeon: Candee Furbish, MD;  Location: [facility] CATH LAB;  Service: Cardiovascular;  Laterality: N/A;  right radial artery approach  . Cholecystectomy open  1970's?  . Joint replacement    . Carpal tunnel release Bilateral ~ 2003-2004  . Cardiac catheterization N/A 05/02/2015    Procedure: Left Heart Cath and Coronary Angiography;  Surgeon: Lorretta Harp, MD;  Location: [facility] CV LAB;  Service: Cardiovascular;  Laterality: N/A;  . Cardiac catheterization N/A 05/04/2015    Procedure: Left Heart Cath and Coronary Angiography;  Surgeon: Wellington Hampshire, MD;  Location: [place] CV LAB;  Service: Cardiovascular;  Laterality: N/A;    Social History:  reports that he quit smoking about 39 years ago. His smoking use included Cigarettes. He has a 40 pack-year smoking history. He has never used smokeless tobacco. He reports that he does not drink alcohol or use illicit drugs.  Allergies  Allergen Reactions  . Ace Inhibitors Swelling    Pt tolerates lisinopril  . Lipitor [Atorvastatin Calcium] Swelling  . Metformin And Related Swelling  . Cefadroxil Hives  . Cephalexin Hives  . Morphine And Related Other (See Comments)    Sweating, feels like is "in rocky boat."  . Robaxin [Methocarbamol] Other (See Comments)    Feels like he is shaky    Family History  Problem Relation Age of Onset  . Hypertension Mother   . Diabetes type I Father   . Cancer Brother      Testicular Cancer  . Cancer Brother     Leukemia    Prior to Admission medications   Medication Sig Start Date End Date Taking? Authorizing Provider  acetaminophen (TYLENOL) 325 MG tablet Take 2 tablets (650 mg total) by mouth every 4 (four) hours as needed for headache or mild pain. 12/08/14  Yes Erlene Quan, PA-C  aspirin EC 81 MG tablet Take 81 mg by mouth every morning.    Yes  Historical Provider, MD  calcium carbonate (TUMS - DOSED IN MG ELEMENTAL CALCIUM) 500 MG chewable tablet Chew 1 tablet (200 mg of elemental calcium total) by mouth 3 (three) times daily as needed for indigestion or heartburn. 11/19/14  Yes Debbe Odea, MD  diphenoxylate-atropine (LOMOTIL) 2.5-0.025 MG tablet Take 1 tablet by mouth 3 (three) times daily as needed for diarrhea or loose stools.  06/14/15  Yes Historical Provider, MD  ezetimibe (ZETIA) 10 MG tablet Take 1 tablet (10 mg total) by mouth daily. 05/06/15  Yes Rhonda G Barrett, PA-C  fexofenadine (ALLEGRA) 180 MG tablet Take 180 mg by mouth daily as needed for allergies or rhinitis.   Yes Historical Provider, MD  furosemide (LASIX) 40 MG tablet Take 1 tablet (40 mg total) by mouth daily. 04/03/14  Yes Lavone Orn, MD  insulin NPH Human (HUMULIN N) 100 UNIT/ML injection Inject 0.2 mLs (20 Units total) into the skin 2 (two) times daily at 8 am and 10 pm. 04/18/15  Yes Kelvin Cellar, MD  isosorbide mononitrate (IMDUR) 60 MG  24 hr tablet Take 1 tablet (60 mg total) by mouth daily. 07/06/15  Yes Hosie Poisson, MD  lisinopril (PRINIVIL,ZESTRIL) 5 MG tablet Take 5 mg by mouth daily. 06/23/15  Yes Historical Provider, MD  metoprolol (LOPRESSOR) 50 MG tablet Take 50 mg by mouth 2 (two) times daily.   Yes Historical Provider, MD  nitroGLYCERIN (NITROSTAT) 0.4 MG SL tablet Place 1 tablet (0.4 mg total) under the tongue every 5 (five) minutes as needed for chest pain. 12/08/14  Yes Luke K Kilroy, PA-C  omeprazole (PRILOSEC) 20 MG capsule Take 20 mg by mouth daily.   Yes Historical Provider, MD  oxybutynin (DITROPAN) 5 MG tablet Take 5 mg by mouth daily as needed for bladder spasms.   Yes Historical Provider, MD  rosuvastatin (CRESTOR) 20 MG tablet Take 1 tablet (20 mg total) by mouth daily at 6 PM. 07/06/15  Yes Hosie Poisson, MD  senna-docusate (SENOKOT-S) 8.6-50 MG per tablet Take 2 tablets by mouth daily. Patient taking differently: Take 2 tablets by mouth  2 (two) times daily as needed for moderate constipation.  05/11/15  Yes Carmin Muskrat, MD  insulin aspart (NOVOLOG) 100 UNIT/ML injection Inject 0-9 Units into the skin 3 (three) times daily with meals. Patient not taking: Reported on 09/07/2015 07/06/15   Hosie Poisson, MD  metoprolol tartrate (LOPRESSOR) 25 MG tablet Take 1 tablet (25 mg total) by mouth 2 (two) times daily. Patient not taking: Reported on 09/07/2015 07/06/15   Hosie Poisson, MD    Physical Exam: Filed Vitals:   09/07/15 1039 09/07/15 1244  BP: 163/85 168/84  Pulse: 94 93  Temp: 98.3 F (36.8 C)   TempSrc: Oral   Resp: 16 18  SpO2: 98% 98%    Physical Exam  Constitutional: Appears well-developed and well-nourished. No distress.  HENT: Normocephalic. External right and left ear normal. Oropharynx is clear and moist.  Eyes: Conjunctivae and EOM are normal. PERRLA, no scleral icterus.  Neck: Normal ROM. Neck supple. No JVD. No tracheal deviation. No thyromegaly.  CVS: RRR, S1/S2 +, no gallops, no carotid bruit.  Pulmonary: Effort and breath sounds normal, no stridor, rhonchi, wheezes, rales.  Abdominal: Soft. BS +,  no distension, tenderness, rebound or guarding.  Musculoskeletal: Normal range of motion. Left anterior shin ulcer and covered with foam dressing, right heel pressure ulcer Lymphadenopathy: No lymphadenopathy noted, cervical, inguinal. Neuro: Alert. Normal reflexes, muscle tone coordination. No cranial nerve deficit. Genitourinary: swelling and erythema around urine foley cath, swelling in the testicular area and groin area  Psychiatric: Normal mood and affect. Behavior, judgment, thought content normal.   Labs on Admission:  Basic Metabolic Panel:  Recent Labs Lab 09/07/15 1123  NA 134*  K 4.3  CL 98*  CO2 28  GLUCOSE 356*  BUN 12  CREATININE 0.67  CALCIUM 8.8*   Liver Function Tests:  Recent Labs Lab 09/07/15 1123  AST 11*  ALT 12*  ALKPHOS 116  BILITOT 0.9  PROT 7.0  ALBUMIN 3.1*     Recent Labs Lab 09/07/15 1123  LIPASE 22   CBC:  Recent Labs Lab 09/07/15 1123  WBC 12.1*  HGB 13.9  HCT 41.2  MCV 90.9  PLT 231   EKG: pending  If 7PM-7AM, please contact night-coverage [email or web address] Password [facility] 09/07/2015, 2:23 PM

## 2015-09-07 NOTE — Progress Notes (Signed)
Pt admitted to floor from ED. Pt from home with multiple skin issues. Noted on  buttock cheeks are opened excoriated areas. Prominent scratched marks to right hip/thigh region. Also has a stage 2 wound to right heel. allevyn dsg applied. Very pink areas noted in abdominal folds and perineum areas.microguard powder applied. Vwilliams,rn.

## 2015-09-08 DIAGNOSIS — E871 Hypo-osmolality and hyponatremia: Secondary | ICD-10-CM | POA: Diagnosis not present

## 2015-09-08 DIAGNOSIS — R109 Unspecified abdominal pain: Secondary | ICD-10-CM | POA: Diagnosis not present

## 2015-09-08 DIAGNOSIS — B9689 Other specified bacterial agents as the cause of diseases classified elsewhere: Secondary | ICD-10-CM | POA: Diagnosis not present

## 2015-09-08 DIAGNOSIS — L03314 Cellulitis of groin: Secondary | ICD-10-CM | POA: Diagnosis present

## 2015-09-08 DIAGNOSIS — K219 Gastro-esophageal reflux disease without esophagitis: Secondary | ICD-10-CM | POA: Diagnosis present

## 2015-09-08 DIAGNOSIS — Z881 Allergy status to other antibiotic agents status: Secondary | ICD-10-CM | POA: Diagnosis not present

## 2015-09-08 DIAGNOSIS — Z8701 Personal history of pneumonia (recurrent): Secondary | ICD-10-CM | POA: Diagnosis not present

## 2015-09-08 DIAGNOSIS — Z7982 Long term (current) use of aspirin: Secondary | ICD-10-CM | POA: Diagnosis not present

## 2015-09-08 DIAGNOSIS — Z794 Long term (current) use of insulin: Secondary | ICD-10-CM | POA: Diagnosis not present

## 2015-09-08 DIAGNOSIS — Z806 Family history of leukemia: Secondary | ICD-10-CM | POA: Diagnosis not present

## 2015-09-08 DIAGNOSIS — Z96651 Presence of right artificial knee joint: Secondary | ICD-10-CM | POA: Diagnosis present

## 2015-09-08 DIAGNOSIS — M1990 Unspecified osteoarthritis, unspecified site: Secondary | ICD-10-CM | POA: Diagnosis present

## 2015-09-08 DIAGNOSIS — Z8043 Family history of malignant neoplasm of testis: Secondary | ICD-10-CM | POA: Diagnosis not present

## 2015-09-08 DIAGNOSIS — I2510 Atherosclerotic heart disease of native coronary artery without angina pectoris: Secondary | ICD-10-CM | POA: Diagnosis present

## 2015-09-08 DIAGNOSIS — E1165 Type 2 diabetes mellitus with hyperglycemia: Secondary | ICD-10-CM

## 2015-09-08 DIAGNOSIS — L89612 Pressure ulcer of right heel, stage 2: Secondary | ICD-10-CM | POA: Diagnosis present

## 2015-09-08 DIAGNOSIS — H538 Other visual disturbances: Secondary | ICD-10-CM | POA: Diagnosis present

## 2015-09-08 DIAGNOSIS — N319 Neuromuscular dysfunction of bladder, unspecified: Secondary | ICD-10-CM

## 2015-09-08 DIAGNOSIS — E0841 Diabetes mellitus due to underlying condition with diabetic mononeuropathy: Secondary | ICD-10-CM | POA: Diagnosis not present

## 2015-09-08 DIAGNOSIS — Z87440 Personal history of urinary (tract) infections: Secondary | ICD-10-CM | POA: Diagnosis not present

## 2015-09-08 DIAGNOSIS — Z8249 Family history of ischemic heart disease and other diseases of the circulatory system: Secondary | ICD-10-CM | POA: Diagnosis not present

## 2015-09-08 DIAGNOSIS — Z87891 Personal history of nicotine dependence: Secondary | ICD-10-CM | POA: Diagnosis not present

## 2015-09-08 DIAGNOSIS — M545 Low back pain: Secondary | ICD-10-CM | POA: Diagnosis present

## 2015-09-08 DIAGNOSIS — I472 Ventricular tachycardia: Secondary | ICD-10-CM | POA: Diagnosis not present

## 2015-09-08 DIAGNOSIS — Z888 Allergy status to other drugs, medicaments and biological substances status: Secondary | ICD-10-CM | POA: Diagnosis not present

## 2015-09-08 DIAGNOSIS — I493 Ventricular premature depolarization: Secondary | ICD-10-CM | POA: Diagnosis present

## 2015-09-08 DIAGNOSIS — N390 Urinary tract infection, site not specified: Secondary | ICD-10-CM | POA: Diagnosis not present

## 2015-09-08 DIAGNOSIS — Z9289 Personal history of other medical treatment: Secondary | ICD-10-CM

## 2015-09-08 DIAGNOSIS — T83511A Infection and inflammatory reaction due to indwelling urethral catheter, initial encounter: Secondary | ICD-10-CM | POA: Diagnosis not present

## 2015-09-08 DIAGNOSIS — R05 Cough: Secondary | ICD-10-CM | POA: Diagnosis present

## 2015-09-08 DIAGNOSIS — Z9111 Patient's noncompliance with dietary regimen: Secondary | ICD-10-CM | POA: Diagnosis not present

## 2015-09-08 DIAGNOSIS — Z8673 Personal history of transient ischemic attack (TIA), and cerebral infarction without residual deficits: Secondary | ICD-10-CM | POA: Diagnosis not present

## 2015-09-08 DIAGNOSIS — I5032 Chronic diastolic (congestive) heart failure: Secondary | ICD-10-CM | POA: Diagnosis not present

## 2015-09-08 DIAGNOSIS — I10 Essential (primary) hypertension: Secondary | ICD-10-CM | POA: Diagnosis present

## 2015-09-08 DIAGNOSIS — Z885 Allergy status to narcotic agent status: Secondary | ICD-10-CM | POA: Diagnosis not present

## 2015-09-08 DIAGNOSIS — Z1612 Extended spectrum beta lactamase (ESBL) resistance: Secondary | ICD-10-CM | POA: Diagnosis present

## 2015-09-08 DIAGNOSIS — B372 Candidiasis of skin and nail: Secondary | ICD-10-CM | POA: Diagnosis not present

## 2015-09-08 DIAGNOSIS — E1151 Type 2 diabetes mellitus with diabetic peripheral angiopathy without gangrene: Secondary | ICD-10-CM

## 2015-09-08 DIAGNOSIS — G8929 Other chronic pain: Secondary | ICD-10-CM | POA: Diagnosis present

## 2015-09-08 DIAGNOSIS — E785 Hyperlipidemia, unspecified: Secondary | ICD-10-CM | POA: Diagnosis not present

## 2015-09-08 DIAGNOSIS — Y846 Urinary catheterization as the cause of abnormal reaction of the patient, or of later complication, without mention of misadventure at the time of the procedure: Secondary | ICD-10-CM | POA: Diagnosis present

## 2015-09-08 DIAGNOSIS — E1140 Type 2 diabetes mellitus with diabetic neuropathy, unspecified: Secondary | ICD-10-CM | POA: Diagnosis not present

## 2015-09-08 DIAGNOSIS — G4733 Obstructive sleep apnea (adult) (pediatric): Secondary | ICD-10-CM | POA: Diagnosis present

## 2015-09-08 DIAGNOSIS — Z6841 Body Mass Index (BMI) 40.0 and over, adult: Secondary | ICD-10-CM | POA: Diagnosis not present

## 2015-09-08 DIAGNOSIS — L309 Dermatitis, unspecified: Secondary | ICD-10-CM | POA: Diagnosis present

## 2015-09-08 DIAGNOSIS — Z79899 Other long term (current) drug therapy: Secondary | ICD-10-CM | POA: Diagnosis not present

## 2015-09-08 DIAGNOSIS — I455 Other specified heart block: Secondary | ICD-10-CM | POA: Diagnosis not present

## 2015-09-08 DIAGNOSIS — I441 Atrioventricular block, second degree: Secondary | ICD-10-CM | POA: Diagnosis present

## 2015-09-08 DIAGNOSIS — B961 Klebsiella pneumoniae [K. pneumoniae] as the cause of diseases classified elsewhere: Secondary | ICD-10-CM | POA: Diagnosis present

## 2015-09-08 DIAGNOSIS — Z833 Family history of diabetes mellitus: Secondary | ICD-10-CM | POA: Diagnosis not present

## 2015-09-08 LAB — CBC
HCT: 35 % — ABNORMAL LOW (ref 39.0–52.0)
HEMOGLOBIN: 11.7 g/dL — AB (ref 13.0–17.0)
MCH: 30 pg (ref 26.0–34.0)
MCHC: 33.4 g/dL (ref 30.0–36.0)
MCV: 89.7 fL (ref 78.0–100.0)
PLATELETS: 220 10*3/uL (ref 150–400)
RBC: 3.9 MIL/uL — AB (ref 4.22–5.81)
RDW: 13.1 % (ref 11.5–15.5)
WBC: 9.1 10*3/uL (ref 4.0–10.5)

## 2015-09-08 LAB — BASIC METABOLIC PANEL
ANION GAP: 7 (ref 5–15)
BUN: 12 mg/dL (ref 6–20)
CALCIUM: 7.8 mg/dL — AB (ref 8.9–10.3)
CO2: 27 mmol/L (ref 22–32)
CREATININE: 0.65 mg/dL (ref 0.61–1.24)
Chloride: 98 mmol/L — ABNORMAL LOW (ref 101–111)
Glucose, Bld: 335 mg/dL — ABNORMAL HIGH (ref 65–99)
Potassium: 3.8 mmol/L (ref 3.5–5.1)
Sodium: 132 mmol/L — ABNORMAL LOW (ref 135–145)

## 2015-09-08 LAB — GLUCOSE, CAPILLARY
GLUCOSE-CAPILLARY: 244 mg/dL — AB (ref 65–99)
GLUCOSE-CAPILLARY: 215 mg/dL — AB (ref 65–99)
GLUCOSE-CAPILLARY: 196 mg/dL — AB (ref 65–99)
GLUCOSE-CAPILLARY: 227 mg/dL — AB (ref 65–99)

## 2015-09-08 LAB — TROPONIN I: Troponin I: <0.03 ng/mL (ref <0.031)

## 2015-09-08 LAB — MRSA PCR SCREENING: MRSA by PCR: POSITIVE — AB

## 2015-09-08 MED ORDER — CHLORHEXIDINE GLUCONATE CLOTH 2 % EX PADS
6.0000 | MEDICATED_PAD | Freq: Every day | CUTANEOUS | Status: AC
  Administered 2015-09-09 – 2015-09-11 (×3): 6 via TOPICAL

## 2015-09-08 MED ORDER — INSULIN ASPART 100 UNIT/ML ~~LOC~~ SOLN
4.0000 [IU] | Freq: Three times a day (TID) | SUBCUTANEOUS | Status: DC
  Administered 2015-09-08 – 2015-09-09 (×3): 4 [IU] via SUBCUTANEOUS

## 2015-09-08 MED ORDER — MUPIROCIN 2 % EX OINT
1.0000 "application " | TOPICAL_OINTMENT | Freq: Two times a day (BID) | CUTANEOUS | Status: AC
  Administered 2015-09-08 – 2015-09-11 (×7): 1 via NASAL
  Filled 2015-09-08: qty 22

## 2015-09-08 MED ORDER — INSULIN ASPART 100 UNIT/ML ~~LOC~~ SOLN
0.0000 [IU] | Freq: Three times a day (TID) | SUBCUTANEOUS | Status: DC
  Administered 2015-09-08: 4 [IU] via SUBCUTANEOUS
  Administered 2015-09-09: 11 [IU] via SUBCUTANEOUS
  Administered 2015-09-09: 7 [IU] via SUBCUTANEOUS
  Administered 2015-09-10: 6 [IU] via SUBCUTANEOUS
  Administered 2015-09-10 (×2): 4 [IU] via SUBCUTANEOUS
  Administered 2015-09-11: 3 [IU] via SUBCUTANEOUS
  Administered 2015-09-11: 4 [IU] via SUBCUTANEOUS
  Administered 2015-09-11 – 2015-09-12 (×2): 3 [IU] via SUBCUTANEOUS
  Administered 2015-09-12: 4 [IU] via SUBCUTANEOUS
  Administered 2015-09-12: 7 [IU] via SUBCUTANEOUS
  Administered 2015-09-13 – 2015-09-14 (×4): 3 [IU] via SUBCUTANEOUS
  Administered 2015-09-15: 4 [IU] via SUBCUTANEOUS

## 2015-09-08 NOTE — Progress Notes (Signed)
Paged Dr. Doyle Askew about elevated CBG in 500's.

## 2015-09-08 NOTE — Progress Notes (Signed)
Nutrition Brief Note  Patient identified on the [place] Report  Wt Readings from Last 15 Encounters:  07/06/15 350 lb 8 oz (158.986 kg)  06/07/15 345 lb (156.491 kg)  05/11/15 330 lb (149.687 kg)  05/10/15 329 lb (149.233 kg)  05/09/15 329 lb (149.233 kg)  05/06/15 344 lb 11.2 oz (156.355 kg)  04/19/15 350 lb (158.759 kg)  04/18/15 346 lb 6.4 oz (157.126 kg)  12/22/14 345 lb (156.491 kg)  12/09/14 352 lb 8 oz (159.893 kg)  11/19/14 341 lb 12.8 oz (155.039 kg)  11/11/14 339 lb 1.1 oz (153.8 kg)  09/23/14 351 lb (159.213 kg)  09/20/14 346 lb (156.945 kg)  09/07/14 345 lb (156.491 kg)    There is no weight on file to calculate BMI. Patient meets criteria for Obese, Class III  based on current BMI. \  Spoke with pt at bedside. He reports he is feeling much better today. When RD asked about PO, he talked about how great the omelet he had this morning was. Pt reports his appetite has returned after feeling sick for extended periods of time; per chart, multiple hospitalizations for UTIs this year. Despite his previous poor appetite, pt reports no weight loss. Per chart, wt has been stable x10 months. PO intake on file 75% for all meals thus far. At this point, ONS not warranted.  Current diet order is Carb modified, patient is consuming approximately 75% of meals at this time. Labs and medications reviewed.   No nutrition interventions warranted at this time. If nutrition issues arise, please consult RD.   [name]. Brian Cahoon, MS, RD LDN After Hours/Weekend Pager [phone]

## 2015-09-08 NOTE — Consult Note (Signed)
WOC wound consult note Reason for Consult: Stage 2 pressure injury on rightr heel (posterior aspect), also fungal overgrowth in bilateral inguinal areas and in the sub-pannicular areas  Wound type:Moisture associated skin damage (MASD), specifically intertriginous dermatitis (ITD).  Pressure Pressure Ulcer POA: Yes Measurement:1.2cm x 1.6cm x 0.2cm Wound bed:red, moist Drainage (amount, consistency, odor) scant to small amount of serous Periwound:intact, dry, flaking Dressing procedure/placement/frequency:I will implement a calcium alginate dressing to the posterior heel to absorb exudate and maintain a moist environment for healing of the Stage 2 pressure injury.  I am adding a therapeutic sleep surface with low air loss feature to assist with fungal management and provided a pressure redistribution chair cushion for use when OOB,  I have provided bilateral pressure redistribution heel boots and have provided Nursing with guidance for skin care via the Orders Section of this record and using our house products. [place] nursing team will not follow, but will remain available to this patient, the nursing and medical teams.  Please re-consult if needed. Thanks, Brian Flakes, MSN, RN, [place], Arther Abbott  Pager# [phone]

## 2015-09-08 NOTE — Progress Notes (Signed)
Results for [name], [name] (MRN [id number]) as of 09/08/2015 09:16  Ref. Range 07/06/2015 07:39 07/06/2015 12:09 09/07/2015 15:22 09/08/2015 07:44  Glucose-Capillary Latest Ref Range: 65-99 mg/dL 145 (H) 180 (H) 328 (H) 244 (H)  Noted that blood sugars elevated. Recommend increasing Novolog correction scale to MODERATE TID if blood sugars continue to be greater than 180 mg/dl. Will continue to monitor while in the hospital. Harvel Ricks RN BSN CDE

## 2015-09-08 NOTE — Progress Notes (Signed)
Patient ID: [name], male   DOB: 07/04/1946, 69 y.o.   MRN: [id number]  [facility] PROGRESS NOTE  Leslie Deschenes Stovall [id number] DOB: 1946-07-06 DOA: 2015/10/02 PCP: Irven Shelling, MD   Brief narrative:    Pt is 69 yo male with DM type II, HTN, PVD, chronic indwelling foley catheter, presented to [facility] ED with main concern of several days duration of pus in the foley catheter. Pt reports recurrent UTI's with different bacterias present and requiring multiple ABX for treatment. Pt also reports noticing productive cough of yellow sputum, no fevers, chills, chest pain or shortness of breath, no abd concern.   In ED, pt noted to be hemodynamically stable, blood work notable for WBC 12K, Na 134. Pt started on Rocephin. TRH asked to admit for further evaluation.   Assessment/Plan:    Active Problems: Neurogenic bladder with pus around the foley cath with cellulitis in the groin and testicular area  - in pt with chronic indwelling cath and recurrent UTI's, ESBL, Pseudomonas - place on Maxipime for now, continue day #2 and follow up on urine cultures   Productive cough - CT chest requested for clearer evaluation - no signs of PNA, add antitussives   DM type II with complications of PVD, neuropathy - continue home regimen with insulin - add SSI resistant scale with no HS coverage   Right heel pressure ulcer - stage 2 pressure injury on rightr heel (posterior aspect), 1.2 x 1.6 x 0.2 cm -  implement a calcium alginate dressing to the posterior heel to absorb exudate and maintain a moist environment for healing of the Stage 2 pressure injury  Cellulitis in bilateral inguinal areas  - ? Bacterial and fungal with moisture associated skin damage,  intertriginous dermatitis  - adding a therapeutic sleep surface with low air loss feature to assist with fungal management and provide a pressure redistribution chair cushion for use when OOB, - continue Maxipime and Diflucan day  #2  Chronic diastolic CHF - continue home regimen with Lasix - weight is 158 kg on admission and unchanged in the past 24 hours  - monitor daily weights, strict I/O  HLD - continue zetia   HTN, essential - continue home medical regimen   Morbid obesity  - BMI > 40   DVT prophylaxis - Lovenox SQ   Code Status: Full.  Family Communication:  plan of care discussed with the patient Disposition Plan: Home when off IV ABX   IV access:  Peripheral IV  Procedures and diagnostic studies:    Ct Chest Wo Contrast October 02, 2015  Questionable fullness in the left suprahilar region, cannot exclude left hilar adenopathy. Recommend further characterization with chest CT with IV contrast. 2. Left upper lobe 3 mm solid pulmonary nodule, for which 11 month stability has been demonstrated. If the patient is at high risk for bronchogenic carcinoma, follow-up chest CT at 1 year is recommended. If the patient is at low risk, no follow-up is needed.   Medical Consultants:  WOC  Other Consultants:  PT  IAnti-Infectives:   Cefepime 10/02/2023 --> Fluconazole October 02, 2023 -->  Faye Ramsay, MD  [facility] Pager [phone]  If 7PM-7AM, please contact night-coverage [email or web address] Password [email or web address] 09/08/2015, 1:31 PM  HPI/Subjective: No events overnight.   Objective: Filed Vitals:   October 02, 2015 1624 Oct 02, 2015 1643 10-02-2015 2032 09/08/15 0502  BP: 165/87  130/67 125/60  Pulse: 93  90 75  Temp: 97.6 F (36.4 C)  97.7 F (36.5 C) 99.2 F (37.3 C)  TempSrc: Oral  Oral Oral  Resp: 18  16 22   Height:  6\' 2"  (1.88 m)    SpO2: 100%  97% 96%    Intake/Output Summary (Last 24 hours) at 09/08/15 1331 Last data filed at 09/08/15 1325  Gross per 24 hour  Intake   3724 ml  Output   3900 ml  Net   -176 ml    Exam:   General:  Pt is alert, follows commands appropriately, not in acute distress  Cardiovascular: Regular rate and rhythm, no rubs, no gallops  Respiratory: Clear to auscultation bilaterally, no  wheezing, no crackles, no rhonchi  Abdomen: Soft, non tender, non distended, bowel sounds present, no guarding   Data Reviewed: Basic Metabolic Panel:  Recent Labs Lab 09/07/15 1123 09/07/15 1700 09/08/15 0438  NA 134*  --  132*  K 4.3  --  3.8  CL 98*  --  98*  CO2 28  --  27  GLUCOSE 356*  --  335*  BUN 12  --  12  CREATININE 0.67 0.62 0.65  CALCIUM 8.8*  --  7.8*   Liver Function Tests:  Recent Labs Lab 09/07/15 1123  AST 11*  ALT 12*  ALKPHOS 116  BILITOT 0.9  PROT 7.0  ALBUMIN 3.1*    Recent Labs Lab 09/07/15 1123  LIPASE 22   CBC:  Recent Labs Lab 09/07/15 1123 09/07/15 1700 09/08/15 0438  WBC 12.1* 10.3 9.1  HGB 13.9 12.9* 11.7*  HCT 41.2 38.0* 35.0*  MCV 90.9 90.9 89.7  PLT 231 208 220   Cardiac Enzymes:  Recent Labs Lab 09/07/15 1700 09/07/15 2145 09/08/15 0438  TROPONINI <0.03 <0.03 <0.03   CBG:  Recent Labs Lab 09/07/15 1522 09/08/15 0744  GLUCAP 328* 244*    Recent Results (from the past 240 hour(s))  Urine culture     Status: None (Preliminary result)   Collection Time: 09/07/15 12:33 PM  Result Value Ref Range Status   Specimen Description URINE, CATHETERIZED  Final   Special Requests Immunocompromised  Final   Culture   Final    TOO YOUNG TO READ Performed at [facility]    Report Status PENDING  Incomplete  Urine culture     Status: None (Preliminary result)   Collection Time: 09/07/15 12:33 PM  Result Value Ref Range Status   Specimen Description URINE, CATHETERIZED  Final   Special Requests NONE  Final   Culture   Final    TOO YOUNG TO READ Performed at [facility]    Report Status PENDING  Incomplete     Scheduled Meds: . aspirin EC  81 mg Oral q morning - 10a  . ceFEPime  IV  1 g Intravenous 3 times per day  . enoxaparin  injection  40 mg Subcutaneous Q24H  . ezetimibe  10 mg Oral Daily  . fluconazole (DIFLUCAN) IV  100 mg Intravenous Q24H  . furosemide  40 mg Oral Daily  . insulin  aspart  0-9 Units Subcutaneous TID WC  . insulin NPH Human  20 Units Subcutaneous BID AC & HS  . isosorbide mononitrate  60 mg Oral Daily  . lisinopril  5 mg Oral Daily  . loratadine  10 mg Oral Daily  . metoprolol  50 mg Oral BID  . pantoprazole  40 mg Oral Daily  . rosuvastatin  20 mg Oral q1800

## 2015-09-09 LAB — CBC
HEMATOCRIT: 36.8 % — AB (ref 39.0–52.0)
Hemoglobin: 12.4 g/dL — ABNORMAL LOW (ref 13.0–17.0)
MCH: 30.7 pg (ref 26.0–34.0)
MCHC: 33.7 g/dL (ref 30.0–36.0)
MCV: 91.1 fL (ref 78.0–100.0)
Platelets: 240 10*3/uL (ref 150–400)
RBC: 4.04 MIL/uL — ABNORMAL LOW (ref 4.22–5.81)
RDW: 13.1 % (ref 11.5–15.5)
WBC: 8.7 10*3/uL (ref 4.0–10.5)

## 2015-09-09 LAB — GLUCOSE, CAPILLARY
GLUCOSE-CAPILLARY: 587 mg/dL — AB (ref 65–99)
Glucose-Capillary: 403 mg/dL — ABNORMAL HIGH (ref 65–99)
Glucose-Capillary: 204 mg/dL — ABNORMAL HIGH (ref 65–99)
Glucose-Capillary: 210 mg/dL — ABNORMAL HIGH (ref 65–99)
Glucose-Capillary: 113 mg/dL — ABNORMAL HIGH (ref 65–99)

## 2015-09-09 LAB — BASIC METABOLIC PANEL
Anion gap: 7 (ref 5–15)
BUN: 13 mg/dL (ref 6–20)
CALCIUM: 8.7 mg/dL — AB (ref 8.9–10.3)
CO2: 29 mmol/L (ref 22–32)
CREATININE: 0.72 mg/dL (ref 0.61–1.24)
Chloride: 101 mmol/L (ref 101–111)
GFR calc non Af Amer: >60 mL/min (ref >60)
Glucose, Bld: 197 mg/dL — ABNORMAL HIGH (ref 65–99)
Potassium: 3.8 mmol/L (ref 3.5–5.1)
SODIUM: 137 mmol/L (ref 135–145)

## 2015-09-09 MED ORDER — INSULIN NPH (HUMAN) (ISOPHANE) 100 UNIT/ML ~~LOC~~ SUSP
22.0000 [IU] | Freq: Two times a day (BID) | SUBCUTANEOUS | Status: DC
  Administered 2015-09-09 – 2015-09-10 (×2): 22 [IU] via SUBCUTANEOUS
  Filled 2015-09-09: qty 10

## 2015-09-09 MED ORDER — DIPHENHYDRAMINE HCL 50 MG/ML IJ SOLN
25.0000 mg | Freq: Four times a day (QID) | INTRAMUSCULAR | Status: DC | PRN
  Administered 2015-09-09 – 2015-09-12 (×2): 25 mg via INTRAVENOUS
  Filled 2015-09-09 (×2): qty 1

## 2015-09-09 MED ORDER — HYDROXYZINE HCL 25 MG PO TABS
25.0000 mg | ORAL_TABLET | Freq: Four times a day (QID) | ORAL | Status: DC | PRN
  Administered 2015-09-09 – 2015-09-12 (×3): 25 mg via ORAL
  Filled 2015-09-09 (×3): qty 1

## 2015-09-09 MED ORDER — DIPHENHYDRAMINE HCL 50 MG/ML IJ SOLN: 25.0000 mg | Freq: Three times a day (TID) | INTRAMUSCULAR | Status: DC

## 2015-09-09 MED ORDER — ENOXAPARIN SODIUM 80 MG/0.8ML ~~LOC~~ SOLN
80.0000 mg | SUBCUTANEOUS | Status: DC
  Administered 2015-09-09 – 2015-09-14 (×6): 80 mg via SUBCUTANEOUS
  Filled 2015-09-09 (×6): qty 0.8

## 2015-09-09 MED ORDER — INSULIN ASPART 100 UNIT/ML ~~LOC~~ SOLN
6.0000 [IU] | Freq: Three times a day (TID) | SUBCUTANEOUS | Status: DC
  Administered 2015-09-09: 6 [IU] via SUBCUTANEOUS

## 2015-09-09 NOTE — Care Management Note (Signed)
Case Management Note  Patient Details  Name: [name] MRN: [id number] Date of Birth: [birth year]  Subjective/Objective:       Admitted with UTI, pus from foley             Action/Plan: Discharge planning, active with [facility] Go, will assess for resumption at d/c, will need orders   Expected Discharge Date:   (UNKNOWN)               Expected Discharge Plan:  [place]  In-House Referral:  NA  Discharge planning Services  CM Consult  Post Acute Care Choice:  Home Health Choice offered to:  Patient  DME Arranged:  N/A DME Agency:  NA  HH Arranged:  RN, PT, OT, Nurse's Aide HH Agency:  [facility]  Status of Service:  In process, will continue to follow  Medicare Important Message Given:    Date Medicare IM Given:    Medicare IM give by:    Date Additional Medicare IM Given:    Additional Medicare Important Message give by:     If discussed at [place] of Stay Meetings, dates discussed:    Additional Comments:  Guadalupe Maple, RN 09/09/2015, 1:02 PM

## 2015-09-09 NOTE — Progress Notes (Signed)
Inpatient Diabetes Program Recommendations  AACE/ADA: New Consensus Statement on Inpatient Glycemic Control (2015)  Target Ranges:  Prepandial:   less than 140 mg/dL      Peak postprandial:   less than 180 mg/dL (1-2 hours)      Critically ill patients:  140 - 180 mg/dL    Results for [name], [name] (MRN [id number]) as of 09/09/2015 15:17  Ref. Range 09/09/2015 07:35 09/09/2015 11:46  Glucose-Capillary Latest Ref Range: 65-99 mg/dL 204 (H) 210 (H)    Home DM Meds: NPH insulin- 20 units bid  Current Insulin Orders: NPH insulin- 20 units bid      Novolog Resistant SSI (0-20 units) TID AC      Novolog 4 units tidwc    MD- Patient eating 100% of meals.  Having some elevated glucose levels.  Please consider the following in-hospital insulin adjustments:  1. Increase NPH insulin to 22 units bid (breakfast and bedtime)  2. Increase Novolog Meal Coverage to 6 units tidwc     --Will follow patient during hospitalization--  Wyn Quaker RN, MSN, CDE Diabetes Coordinator Inpatient Glycemic Control Team Team Pager: [phone] (8a-5p)

## 2015-09-09 NOTE — Progress Notes (Signed)
Assumed care of pt at 1500. RN Tilda Franco and RN Curt Bears completed bedside reporting and introduced self to pt. Pt is in no apparent distress and resting comfortably. Pt has no needs at this time. Room checked to ensure that environment is safe in order to prevent falls. Bed in lowest and locked position. Pt notified that hourly rounding will continue and that he should call for any needs between RN and PCT rounds. Will continue to monitor.

## 2015-09-09 NOTE — Evaluation (Signed)
Physical Therapy Evaluation Patient Details Name: [name] MRN: [id number] DOB: 04-Jun-1946 Today's Date: 09/09/2015   History of Present Illness  69 yo male admitted with UTI, catheter discharge. Hx of HTN, obesity, DM, DVT, CAD, neurogenic bladder, CVA.   Clinical Impression  On eval, pt required Mod assist +2 for bed mobility, Mod assist to stand, and Min assist to take several steps along side of bed with RW. Increased time for all tasks. Increased pain in L shoulder after pulling himself up in bed. Pt reports he was been working with PT at home and he would like to resume his [facility] services at discharge.    Follow Up Recommendations Home health PT;Supervision - 24 hour supervision/assist (pt would like to resume [facility] services including aide)    Equipment Recommendations  None recommended by PT    Recommendations for Other Services       Precautions / Restrictions Precautions Precautions: Fall Precaution Comments: pt appears to have brief syncopal episodes that he is not aware of Restrictions Weight Bearing Restrictions: No      Mobility  Bed Mobility Overal bed mobility: Needs Assistance Bed Mobility: Supine to Sit;Sit to Supine     Supine to sit: Mod assist;+2 for physical assistance;+2 for safety/equipment;HOB elevated Sit to supine: Mod assist;+2 for physical assistance;+2 for safety/equipment;HOB elevated   General bed mobility comments: Increased assist likely due to difficulty maneuvering on air mattress/bari bed. Increased time as well. Assist for trunk, LEs, and to scoot to EOB. Pt appeared to have very odd, brief syncopal episode in the middle of getting to EOB  Transfers Overall transfer level: Needs assistance Equipment used: Rolling walker (2 wheeled) Transfers: Sit to/from Stand Sit to Stand: Mod assist;From elevated surface         General transfer comment: Assist to rise, stabilize, control descent. Pt declined stand pivot to  recliner  Ambulation/Gait Ambulation/Gait assistance: Min assist           General Gait Details: Lateral steps along side of bed with RW. Did not feel safe attempting ambulation in hall due to odd syncopal episode while getting to EOB  Stairs            Wheelchair Mobility    Modified Rankin (Stroke Patients Only)       Balance Overall balance assessment: Needs assistance         Standing balance support: Bilateral upper extremity supported;During functional activity Standing balance-Leahy Scale: Poor Standing balance comment: requires RW                             Pertinent Vitals/Pain Pain Assessment: Faces Faces Pain Scale: Hurts whole lot Pain Location: L shoulder after pulling himself up in bed    Home Living Family/patient expects to be discharged to:: Private residence Living Arrangements: Alone Available Help at Discharge: Available PRN/intermittently;Personal care attendant Type of Home: House Home Access: Ramped entrance     Home Layout: One level Home Equipment: Cane - single point;Grab bars - toilet;Grab bars - tub/shower;Electric scooter;Wheelchair - [employer] - 2 wheels;Walker - standard      Prior Function Level of Independence: Needs assistance   Gait / Transfers Assistance Needed: walking with RW with PT           Hand Dominance        Extremity/Trunk Assessment   Upper Extremity Assessment: Generalized weakness           Lower Extremity  Assessment: Generalized weakness      Cervical / Trunk Assessment: Normal  Communication   Communication: No difficulties  Cognition Arousal/Alertness: Awake/alert Behavior During Therapy: WFL for tasks assessed/performed Overall Cognitive Status: Within Functional Limits for tasks assessed                      General Comments      Exercises        Assessment/Plan    PT Assessment Patient needs continued PT services  PT Diagnosis Difficulty  walking;Generalized weakness;Acute pain   PT Problem List Decreased strength;Decreased activity tolerance;Decreased balance;Decreased mobility;Pain  PT Treatment Interventions Gait training;Functional mobility training;Therapeutic activities;Patient/family education;Balance training;Therapeutic exercise   PT Goals (Current goals can be found in the Care Plan section) Acute Rehab PT Goals Patient Stated Goal: home PT Goal Formulation: With patient Time For Goal Achievement: 09/23/15 Potential to Achieve Goals: Fair    Frequency Min 3X/week   Barriers to discharge        Co-evaluation               End of Session   Activity Tolerance: Patient tolerated treatment well Patient left: in bed;with call bell/phone within reach           Time: 1339-1359 PT Time Calculation (min) (ACUTE ONLY): 20 min   Charges:   PT Evaluation $Initial PT Evaluation Tier I: 1 Procedure     PT G Codes:        Weston Anna, MPT Pager: [phone]

## 2015-09-09 NOTE — Progress Notes (Signed)
Patient ID: [name], male   DOB: 15-Feb-1946, 69 y.o.   MRN: [id number]  [facility] PROGRESS NOTE  [name] [id number] DOB: 1946-06-11 DOA: 09-Sep-2015 PCP: Brian Shelling, MD   Brief narrative:    Pt is 69 yo male with DM type II, HTN, PVD, chronic indwelling foley catheter, presented to [facility] ED with main concern of several days duration of pus in the foley catheter. Pt reports recurrent UTI's with different bacterias present and requiring multiple ABX for treatment. Pt also reports noticing productive cough of yellow sputum, no fevers, chills, chest pain or shortness of breath, no abd concern.   In ED, pt noted to be hemodynamically stable, blood work notable for WBC 12K, Na 134. Pt started on Rocephin. TRH asked to admit for further evaluation.   Assessment/Plan:    Active Problems: Neurogenic bladder with acute UTI in pt with recurrent UTI and secondary to chronic indwelling foley catheter  - in pt with chronic indwelling cath and recurrent UTI's, ESBL, Pseudomonas - pt started on Maxipime based on previous urine cultures and sensitivities, continue day #3 and follow up on urine cultures  - narrown down ABX as soon as possible, leukocytosis has resolved  - preliminary urine culture with g-rods, final culture pending   Productive cough - CT chest requested for clearer evaluation - no signs of PNA, added antitussives and this has now improved   DM type II with complications of PVD, neuropathy - continue home regimen with insulin - appreciate input from diabetic educator, will increase the dose of insulin to 22 U - also increase the dose of meal coverage to 6 U   Right heel pressure ulcer - stage 2 pressure injury on rightr heel (posterior aspect), 1.2 x 1.6 x 0.2 cm -  implement a calcium alginate dressing to the posterior heel to absorb exudate and maintain a moist environment for healing of the Stage 2 pressure injury  Cellulitis in bilateral inguinal  areas  - fungal with moisture associated skin damage,  intertriginous dermatitis  - addws a therapeutic sleep surface with low air loss feature to assist with fungal management and provide a pressure redistribution chair cushion for use when OOB, - appreciate wound care team assistance  - continue Maxipime and Diflucan day #3 and narrow down as soon as possible and clinically indicated   Chronic diastolic CHF - continue home regimen with Lasix - weight is 158 kg on admission and unchanged in the past 24 hours  - monitor daily weights, strict I/O  Hyponatremia - pre renal in etiology - resolved   HLD - continue zetia   HTN, essential - continue home medical regimen   Morbid obesity  - Body mass index is 46.19 kg/(m^2).  DVT prophylaxis - Lovenox SQ   Code Status: Full.  Family Communication:  plan of care discussed with the patient Disposition Plan: Home when off IV ABX, once urine cultures are back   IV access:  Peripheral IV  Procedures and diagnostic studies:    Ct Chest Wo Contrast 09-Sep-2015  Questionable fullness in the left suprahilar region, cannot exclude left hilar adenopathy. Recommend further characterization with chest CT with IV contrast. 2. Left upper lobe 3 mm solid pulmonary nodule, for which 11 month stability has been demonstrated. If the patient is at high risk for bronchogenic carcinoma, follow-up chest CT at 1 year is recommended. If the patient is at low risk, no follow-up is needed.   Medical Consultants:  WOC  Other  Consultants:  PT  IAnti-Infectives:   Cefepime 12/28 --> Fluconazole 12/28 -->  Brian Ramsay, MD  [facility] Pager [phone]  If 7PM-7AM, please contact night-coverage [email or web address] Password [email or web address] 09/09/2015, 3:51 PM  HPI/Subjective: No events overnight. Pt reports feeling better.   Objective: Filed Vitals:   09/08/15 2145 09/09/15 0500 09/09/15 1100 09/09/15 1429  BP: 131/64 147/70 148/79 140/74  Pulse: 76 72 77 78   Temp: 97.8 F (36.6 C) 97.8 F (36.6 C)  97.8 F (36.6 C)  TempSrc: Oral Oral  Oral  Resp: 20 20 20 20   Height:      Weight:      SpO2: 97% 98% 98% 99%    Intake/Output Summary (Last 24 hours) at 09/09/15 1551 Last data filed at 09/09/15 1300  Gross per 24 hour  Intake    600 ml  Output   4100 ml  Net  -3500 ml    Exam:   General:  Pt is alert, follows commands appropriately, not in acute distress  Cardiovascular: Regular rate and rhythm, no rubs, no gallops  Respiratory: Clear to auscultation bilaterally, no wheezing, no crackles, no rhonchi  Abdomen: Soft, non tender, non distended, bowel sounds present, no guarding  GU: bilateral inguinal area with erythema and and some warmth to touch and swelling, improved    Data Reviewed: Basic Metabolic Panel:  Recent Labs Lab 09/07/15 1123 09/07/15 1700 09/08/15 0438 09/09/15 0500  NA 134*  --  132* 137  K 4.3  --  3.8 3.8  CL 98*  --  98* 101  CO2 28  --  27 29  GLUCOSE 356*  --  335* 197*  BUN 12  --  12 13  CREATININE 0.67 0.62 0.65 0.72  CALCIUM 8.8*  --  7.8* 8.7*   Liver Function Tests:  Recent Labs Lab 09/07/15 1123  AST 11*  ALT 12*  ALKPHOS 116  BILITOT 0.9  PROT 7.0  ALBUMIN 3.1*    Recent Labs Lab 09/07/15 1123  LIPASE 22   CBC:  Recent Labs Lab 09/07/15 1123 09/07/15 1700 09/08/15 0438 09/09/15 0500  WBC 12.1* 10.3 9.1 8.7  HGB 13.9 12.9* 11.7* 12.4*  HCT 41.2 38.0* 35.0* 36.8*  MCV 90.9 90.9 89.7 91.1  PLT 231 208 220 240   Cardiac Enzymes:  Recent Labs Lab 09/07/15 1700 09/07/15 2145 09/08/15 0438  TROPONINI <0.03 <0.03 <0.03   CBG:  Recent Labs Lab 09/08/15 1333 09/08/15 1845 09/08/15 2141 09/09/15 0735 09/09/15 1146  GLUCAP 215* 196* 227* 204* 210*    Recent Results (from the past 240 hour(s))  Urine culture     Status: None (Preliminary result)   Collection Time: 09/07/15 12:33 PM  Result Value Ref Range Status   Specimen Description URINE,  CATHETERIZED  Final   Special Requests Immunocompromised  Final   Culture   Final    >=100,000 COLONIES/mL GRAM NEGATIVE RODS Performed at [facility]    Report Status PENDING  Incomplete  Urine culture     Status: None (Preliminary result)   Collection Time: 09/07/15 12:33 PM  Result Value Ref Range Status   Specimen Description URINE, CATHETERIZED  Final   Special Requests NONE  Final   Culture   Final    >=100,000 COLONIES/mL GRAM NEGATIVE RODS Performed at [facility]    Report Status PENDING  Incomplete  MRSA PCR Screening     Status: Abnormal   Collection Time: 09/08/15 10:06 AM  Result Value Ref Range Status  MRSA by PCR POSITIVE (A) NEGATIVE Final    Comment:        The GeneXpert MRSA Assay (FDA approved for NASAL specimens only), is one component of a comprehensive MRSA colonization surveillance program. It is not intended to diagnose MRSA infection nor to guide or monitor treatment for MRSA infections. RESULT CALLED TO, READ BACK BY AND VERIFIED WITH: THIGPEN,J @1359  ON [id number] BY POTEAT,S      Scheduled Meds: . aspirin EC  81 mg Oral q morning - 10a  . ceFEPime  IV  1 g Intravenous 3 times per day  . enoxaparin  injection  40 mg Subcutaneous Q24H  . ezetimibe  10 mg Oral Daily  . fluconazole (DIFLUCAN) IV  100 mg Intravenous Q24H  . furosemide  40 mg Oral Daily  . insulin aspart  0-9 Units Subcutaneous TID WC  . insulin NPH Human  20 Units Subcutaneous BID AC & HS  . isosorbide mononitrate  60 mg Oral Daily  . lisinopril  5 mg Oral Daily  . loratadine  10 mg Oral Daily  . metoprolol  50 mg Oral BID  . pantoprazole  40 mg Oral Daily  . rosuvastatin  20 mg Oral q1800

## 2015-09-09 NOTE — Clinical Documentation Improvement (Signed)
Hospitalist  (Query responses must be documented in the progress notes and discharge summary, not on the CDI BPA itself.  Responses documented on the CDI BPA are not codeable because BPA's are not part of the permanent medical record.)  Cause and effect relationships cannot be assumed and must be documented by the attending MD.  Please clarify and document if the patient's UTI:  - IS secondary to/related to his chronic indwelling foley catheter  - IS NOT related to this chronic indwelling foley catheter  - unable to clinically determine   Please exercise your independent, professional judgment when responding. A specific answer is not anticipated or expected.   Thank You, Erling Conte  RN BSN CCDS [phone] Health Information Management [place]

## 2015-09-10 DIAGNOSIS — Z794 Long term (current) use of insulin: Secondary | ICD-10-CM

## 2015-09-10 LAB — BASIC METABOLIC PANEL
ANION GAP: 8 (ref 5–15)
BUN: 13 mg/dL (ref 6–20)
CHLORIDE: 99 mmol/L — AB (ref 101–111)
CO2: 28 mmol/L (ref 22–32)
Calcium: 8.6 mg/dL — ABNORMAL LOW (ref 8.9–10.3)
Creatinine, Ser: 0.67 mg/dL (ref 0.61–1.24)
GFR calc non Af Amer: >60 mL/min (ref >60)
Glucose, Bld: 255 mg/dL — ABNORMAL HIGH (ref 65–99)
POTASSIUM: 3.8 mmol/L (ref 3.5–5.1)
Sodium: 135 mmol/L (ref 135–145)

## 2015-09-10 LAB — GLUCOSE, CAPILLARY
GLUCOSE-CAPILLARY: 194 mg/dL — AB (ref 65–99)
GLUCOSE-CAPILLARY: 187 mg/dL — AB (ref 65–99)
GLUCOSE-CAPILLARY: 168 mg/dL — AB (ref 65–99)
Glucose-Capillary: 208 mg/dL — ABNORMAL HIGH (ref 65–99)

## 2015-09-10 LAB — CBC
HEMATOCRIT: 36.6 % — AB (ref 39.0–52.0)
HEMOGLOBIN: 12.2 g/dL — AB (ref 13.0–17.0)
MCH: 29.8 pg (ref 26.0–34.0)
MCHC: 33.3 g/dL (ref 30.0–36.0)
MCV: 89.3 fL (ref 78.0–100.0)
Platelets: 229 10*3/uL (ref 150–400)
RBC: 4.1 MIL/uL — AB (ref 4.22–5.81)
RDW: 12.9 % (ref 11.5–15.5)
WBC: 7.5 10*3/uL (ref 4.0–10.5)

## 2015-09-10 MED ORDER — SODIUM CHLORIDE 0.9 % IV SOLN
1.0000 g | INTRAVENOUS | Status: DC
  Administered 2015-09-10 – 2015-09-15 (×6): 1 g via INTRAVENOUS
  Filled 2015-09-10 (×6): qty 1

## 2015-09-10 MED ORDER — INSULIN ASPART 100 UNIT/ML ~~LOC~~ SOLN
7.0000 [IU] | Freq: Three times a day (TID) | SUBCUTANEOUS | Status: DC
  Administered 2015-09-10 – 2015-09-15 (×17): 7 [IU] via SUBCUTANEOUS

## 2015-09-10 MED ORDER — INSULIN NPH (HUMAN) (ISOPHANE) 100 UNIT/ML ~~LOC~~ SUSP
28.0000 [IU] | Freq: Two times a day (BID) | SUBCUTANEOUS | Status: DC
  Administered 2015-09-10 – 2015-09-15 (×10): 28 [IU] via SUBCUTANEOUS
  Filled 2015-09-10: qty 10

## 2015-09-10 NOTE — Progress Notes (Signed)
[facility] PROGRESS NOTE  [name] [id number] DOB: [birth year] DOA: 09/07/2015 PCP: Irven Shelling, MD   Brief narrative 69 year old morbidly obese male with type 2 diabetes mellitus, hypertension, chronic indwelling Foley catheter due to neurogenic bladder with recurrent UTIs, essential hypertension presented to ED after noticing pus in his catheter for several days. He also had productive cough of yellow sputum. Denied any fevers, chills, shortness of breath, chest pain, abdominal pain, dysuria or diarrhea. In the ED he had to receive 12,000 with UA positive for UTI. Started on empiric cefepime and admitted to hospitalist service.   Assessment/Plan: Gram-negative rod UTI with chronic indwelling Foley catheter Patient has recurrent UTI with prior cultures growing ESBL/Pseudomonas. Started on cefepime based on prior cultures. Culture growing gram-negative rods. Follow sensitivity. Remains afebrile. WBC normal.  Productive cough CT chest unremarkable. Stable with antitussives.  Uncontrolled Type 2 diabetes mellitus with peripheral vascular disease, neurogenic bladder and neuropathy Increase Lantus further to 28 units and aspart to 7 units 3 times a day. Check A1c.  Bilateral inguinal area dermatitis Appears to be fungal infection. Added Diflucan. Appreciate wound care recommendations.  Chronic diastolic CHF Appears euvolemic. Continue Lasix. Continue lisinopril and metoprolol.  Essential hypertension Stable. Continue home medications  Dyslipidemia Continue Zetia and statin.  Right heel pressure ulcer Appreciate wound care evaluation.  Morbid obesity   Patient seen by PT and recommends home health with 24-hour supervision.  DVT prophylaxis: Subcutaneous Lovenox Diet: Diabetic/heart healthy  Code Status: Full code Family Communication: None at bedside Disposition Plan: Home with home health possibly tomorrow if continues to improve and final  sensitivity obtained   Consultants:  None  Procedures:  CT chest without contrast  Antibiotics:  IV cefepime since 12/28  HPI/Subjective: Seen and examined. Denies further cough and reports feeling better.  Objective: Filed Vitals:   09/09/15 2106 09/10/15 0521  BP: 171/59 161/63  Pulse: 77 69  Temp: 97.8 F (36.6 C) 97.9 F (36.6 C)  Resp: 21     Intake/Output Summary (Last 24 hours) at 09/10/15 0929 Last data filed at 09/10/15 0700  Gross per 24 hour  Intake    490 ml  Output   4300 ml  Net  -3810 ml   Filed Weights   09/08/15 1400  Weight: 158.759 kg (350 lb)    Exam:   General:  Elderly obese male not in distress  HEENT: No pallor, moist mucosa  Chest: Clear bilaterally neck on CVS: Normal S1 and S2, no murmurs  GI: Soft, nondistended, nontender, chronic Foley catheter draining clear urine  Musculoskeletal: Warm, no edema, right heel ulcer appears clean  CNS: Alert and oriented  Data Reviewed: Basic Metabolic Panel:  Recent Labs Lab 09/07/15 1123 09/07/15 1700 09/08/15 0438 09/09/15 0500 09/10/15 0441  NA 134*  --  132* 137 135  K 4.3  --  3.8 3.8 3.8  CL 98*  --  98* 101 99*  CO2 28  --  27 29 28   GLUCOSE 356*  --  335* 197* 255*  BUN 12  --  12 13 13   CREATININE 0.67 0.62 0.65 0.72 0.67  CALCIUM 8.8*  --  7.8* 8.7* 8.6*   Liver Function Tests:  Recent Labs Lab 09/07/15 1123  AST 11*  ALT 12*  ALKPHOS 116  BILITOT 0.9  PROT 7.0  ALBUMIN 3.1*    Recent Labs Lab 09/07/15 1123  LIPASE 22   No results for input(s): AMMONIA in the last 168 hours. CBC:  Recent Labs  Lab 09/07/15 1123 09/07/15 1700 09/08/15 0438 09/09/15 0500 09/10/15 0441  WBC 12.1* 10.3 9.1 8.7 7.5  HGB 13.9 12.9* 11.7* 12.4* 12.2*  HCT 41.2 38.0* 35.0* 36.8* 36.6*  MCV 90.9 90.9 89.7 91.1 89.3  PLT 231 208 220 240 229   Cardiac Enzymes:  Recent Labs Lab 09/07/15 1700 09/07/15 2145 09/08/15 0438  TROPONINI <0.03 <0.03 <0.03   BNP  (last 3 results)  Recent Labs  09/14/14 0644 12/22/14 2330 07/02/15 1439  BNP 95.8 120.1* 320.3*    ProBNP (last 3 results) No results for input(s): PROBNP in the last 8760 hours.  CBG:  Recent Labs Lab 09/08/15 2141 09/09/15 0735 09/09/15 1146 09/09/15 1653 09/10/15 0754  GLUCAP 227* 204* 210* 113* 208*    Recent Results (from the past 240 hour(s))  Urine culture     Status: None (Preliminary result)   Collection Time: 09/07/15 12:33 PM  Result Value Ref Range Status   Specimen Description URINE, CATHETERIZED  Final   Special Requests Immunocompromised  Final   Culture   Final    >=100,000 COLONIES/mL KLEBSIELLA PNEUMONIAE Confirmed Extended Spectrum Beta-Lactamase Producer (ESBL) Performed at [facility]    Report Status PENDING  Incomplete   Organism ID, Bacteria KLEBSIELLA PNEUMONIAE  Final      Susceptibility   Klebsiella pneumoniae - MIC*    AMPICILLIN >=32 RESISTANT Resistant     CEFAZOLIN >=64 RESISTANT Resistant     CEFTRIAXONE >=64 RESISTANT Resistant     CIPROFLOXACIN >=4 RESISTANT Resistant     GENTAMICIN >=16 RESISTANT Resistant     IMIPENEM <=0.25 SENSITIVE Sensitive     NITROFURANTOIN 128 RESISTANT Resistant     TRIMETH/SULFA >=320 RESISTANT Resistant     AMPICILLIN/SULBACTAM >=32 RESISTANT Resistant     PIP/TAZO 16 SENSITIVE Sensitive     * >=100,000 COLONIES/mL KLEBSIELLA PNEUMONIAE  Urine culture     Status: None (Preliminary result)   Collection Time: 09/07/15 12:33 PM  Result Value Ref Range Status   Specimen Description URINE, CATHETERIZED  Final   Special Requests NONE  Final   Culture   Final    >=100,000 COLONIES/mL KLEBSIELLA PNEUMONIAE SUSCEPTIBILITIES PERFORMED ON PREVIOUS CULTURE WITHIN THE LAST 5 DAYS. Performed at [facility]    Report Status PENDING  Incomplete  MRSA PCR Screening     Status: Abnormal   Collection Time: 09/08/15 10:06 AM  Result Value Ref Range Status   MRSA by PCR POSITIVE (A) NEGATIVE  Final    Comment:        The GeneXpert MRSA Assay (FDA approved for NASAL specimens only), is one component of a comprehensive MRSA colonization surveillance program. It is not intended to diagnose MRSA infection nor to guide or monitor treatment for MRSA infections. RESULT CALLED TO, READ BACK BY AND VERIFIED WITH: THIGPEN,J @1359  ON [id number] BY POTEAT,S      Studies: No results found.  Scheduled Meds: . antiseptic oral rinse  7 mL Mouth Rinse q12n4p  . aspirin EC  81 mg Oral q morning - 10a  . ceFEPime (MAXIPIME) IV  1 g Intravenous 3 times per day  . chlorhexidine  15 mL Mouth Rinse BID  . Chlorhexidine Gluconate Cloth  6 each Topical Q0600  . enoxaparin (LOVENOX) injection  80 mg Subcutaneous Q24H  . ezetimibe  10 mg Oral Daily  . fluconazole (DIFLUCAN) IV  100 mg Intravenous Q24H  . furosemide  40 mg Oral Daily  . insulin aspart  0-20 Units Subcutaneous TID WC  .  insulin aspart  6 Units Subcutaneous TID WC  . insulin NPH Human  22 Units Subcutaneous BID AC & HS  . isosorbide mononitrate  60 mg Oral Daily  . lisinopril  5 mg Oral Daily  . loratadine  10 mg Oral Daily  . metoprolol  50 mg Oral BID  . mupirocin ointment  1 application Nasal BID  . pantoprazole  40 mg Oral Daily  . rosuvastatin  20 mg Oral q1800   Continuous Infusions:     Time spent: 25 minutes    Joana Nolton, [place]  [facility] Pager [phone]. If 7PM-7AM, please contact night-coverage at [email or web address], password [facility] 09/10/2015, 9:29 AM  LOS: 2 days

## 2015-09-10 NOTE — Progress Notes (Signed)
Urine cx growing klebsiella ESBL sensitive to imipenem. . Ordered PICC line and abx switched to daily IV ertapenem.

## 2015-09-11 LAB — URINE CULTURE: Culture: >=100000

## 2015-09-11 LAB — GLUCOSE, CAPILLARY
GLUCOSE-CAPILLARY: 154 mg/dL — AB (ref 65–99)
GLUCOSE-CAPILLARY: 131 mg/dL — AB (ref 65–99)
GLUCOSE-CAPILLARY: 194 mg/dL — AB (ref 65–99)
Glucose-Capillary: 144 mg/dL — ABNORMAL HIGH (ref 65–99)

## 2015-09-11 NOTE — Progress Notes (Signed)
Discuss risks and benefits of PICC insertion.  Pt.  States unsure wants to proceed with PICC and wants to think about it and discuss with MD.  Will follow up later.

## 2015-09-11 NOTE — Progress Notes (Signed)
[facility] PROGRESS NOTE  [name] [id number] DOB: 1945/11/09 DOA: 09/07/2015 PCP: Irven Shelling, MD   Brief narrative 70 year old morbidly obese male with type 2 diabetes mellitus, hypertension, chronic indwelling Foley catheter due to neurogenic bladder with recurrent UTIs, essential hypertension presented to ED after noticing pus in his catheter for several days. He also had productive cough of yellow sputum. Denied any fevers, chills, shortness of breath, chest pain, abdominal pain, dysuria or diarrhea. In the ED he had to receive 12,000 with UA positive for klebsiella ESBL UTI. Started on empiric cefepime , then switched to ertapenem.   Assessment/Plan: Klebsiella ESBL UTI  with chronic indwelling Foley catheter abx switched to ertapenem 1 gm daily. PICC line ordered. Pt anxious on explained the risk and benefit and wanted to think over it .  plan to place the PICC line tomorrow morning and discharge him home with home health. Remains afebrile. WBC normal.  Productive cough CT chest unremarkable. Stable with antitussives.  Uncontrolled Type 2 diabetes mellitus with peripheral vascular disease, neurogenic bladder and neuropathy FSE stable after Lantus increased to 28 units and aspart to 7 units 3 times a day. A1c pending.  Bilateral inguinal area dermatitis Appears to be fungal infection. Added Diflucan. Appreciate wound care recommendations.  Chronic diastolic CHF Appears euvolemic. Continue Lasix. Continue lisinopril and metoprolol.  Essential hypertension Stable. Continue home medications  Dyslipidemia Continue Zetia and statin.  Right heel pressure ulcer Appreciate wound care evaluation.  Morbid obesity   Patient seen by PT and recommends home health with 24-hour supervision. Will need both home health PT and RN  DVT prophylaxis: Subcutaneous Lovenox Diet: Diabetic/heart healthy  Code Status: Full code Family Communication: None at  bedside Disposition Plan: Home with home health after PICC line placed in tomorrow.   Consultants:  None  Procedures:  CT chest without contrast  Antibiotics:  IV cefepime since 12/28-12-30  Iv ertapenem  HPI/Subjective: Seen and examined. Denies any symptoms.  Objective: Filed Vitals:   09/10/15 1346 09/11/15 0554  BP: 128/62 145/59  Pulse: 73 67  Temp: 97.3 F (36.3 C) 98.4 F (36.9 C)  Resp:  20    Intake/Output Summary (Last 24 hours) at 09/11/15 1140 Last data filed at 09/11/15 0545  Gross per 24 hour  Intake 1175.8 ml  Output   2650 ml  Net -1474.2 ml   Filed Weights   09/08/15 1400  Weight: 158.759 kg (350 lb)    Exam:   General:  Elderly obese male not in distress  HEENT: , moist mucosa  Chest: Clear bilaterally    CVS: Normal S1 and S2, no murmurs  GI: Soft, nondistended, nontender, chronic Foley catheter draining clear urine  Musculoskeletal: Warm, no edema, right heel ulcer appears clean    Data Reviewed: Basic Metabolic Panel:  Recent Labs Lab 09/07/15 1123 09/07/15 1700 09/08/15 0438 09/09/15 0500 09/10/15 0441  NA 134*  --  132* 137 135  K 4.3  --  3.8 3.8 3.8  CL 98*  --  98* 101 99*  CO2 28  --  27 29 28   GLUCOSE 356*  --  335* 197* 255*  BUN 12  --  12 13 13   CREATININE 0.67 0.62 0.65 0.72 0.67  CALCIUM 8.8*  --  7.8* 8.7* 8.6*   Liver Function Tests:  Recent Labs Lab 09/07/15 1123  AST 11*  ALT 12*  ALKPHOS 116  BILITOT 0.9  PROT 7.0  ALBUMIN 3.1*    Recent Labs Lab 09/07/15  1123  LIPASE 22   No results for input(s): AMMONIA in the last 168 hours. CBC:  Recent Labs Lab 09/07/15 1123 09/07/15 1700 09/08/15 0438 09/09/15 0500 09/10/15 0441  WBC 12.1* 10.3 9.1 8.7 7.5  HGB 13.9 12.9* 11.7* 12.4* 12.2*  HCT 41.2 38.0* 35.0* 36.8* 36.6*  MCV 90.9 90.9 89.7 91.1 89.3  PLT 231 208 220 240 229   Cardiac Enzymes:  Recent Labs Lab 09/07/15 1700 09/07/15 2145 09/08/15 0438  TROPONINI <0.03  <0.03 <0.03   BNP (last 3 results)  Recent Labs  09/14/14 0644 12/22/14 2330 07/02/15 1439  BNP 95.8 120.1* 320.3*    ProBNP (last 3 results) No results for input(s): PROBNP in the last 8760 hours.  CBG:  Recent Labs Lab 09/10/15 0754 09/10/15 1147 09/10/15 1604 09/10/15 2145 09/11/15 0800  GLUCAP 208* 194* 187* 168* 154*    Recent Results (from the past 240 hour(s))  Urine culture     Status: None   Collection Time: 09/07/15 12:33 PM  Result Value Ref Range Status   Specimen Description URINE, CATHETERIZED  Final   Special Requests Immunocompromised  Final   Culture   Final    >=100,000 COLONIES/mL KLEBSIELLA PNEUMONIAE Confirmed Extended Spectrum Beta-Lactamase Producer (ESBL) Performed at [facility]    Report Status 09/11/2015 FINAL  Final   Organism ID, Bacteria KLEBSIELLA PNEUMONIAE  Final      Susceptibility   Klebsiella pneumoniae - MIC*    AMPICILLIN >=32 RESISTANT Resistant     CEFAZOLIN >=64 RESISTANT Resistant     CEFTRIAXONE >=64 RESISTANT Resistant     CIPROFLOXACIN >=4 RESISTANT Resistant     GENTAMICIN >=16 RESISTANT Resistant     IMIPENEM <=0.25 SENSITIVE Sensitive     NITROFURANTOIN 128 RESISTANT Resistant     TRIMETH/SULFA >=320 RESISTANT Resistant     AMPICILLIN/SULBACTAM >=32 RESISTANT Resistant     PIP/TAZO 16 SENSITIVE Sensitive     * >=100,000 COLONIES/mL KLEBSIELLA PNEUMONIAE  Urine culture     Status: None   Collection Time: 09/07/15 12:33 PM  Result Value Ref Range Status   Specimen Description URINE, CATHETERIZED  Final   Special Requests NONE  Final   Culture   Final    >=100,000 COLONIES/mL KLEBSIELLA PNEUMONIAE SUSCEPTIBILITIES PERFORMED ON PREVIOUS CULTURE WITHIN THE LAST 5 DAYS. Performed at [facility]    Report Status 09/11/2015 FINAL  Final  MRSA PCR Screening     Status: Abnormal   Collection Time: 09/08/15 10:06 AM  Result Value Ref Range Status   MRSA by PCR POSITIVE (A) NEGATIVE Final     Comment:        The GeneXpert MRSA Assay (FDA approved for NASAL specimens only), is one component of a comprehensive MRSA colonization surveillance program. It is not intended to diagnose MRSA infection nor to guide or monitor treatment for MRSA infections. RESULT CALLED TO, READ BACK BY AND VERIFIED WITH: THIGPEN,J @1359  ON [id number] BY POTEAT,S      Studies: No results found.  Scheduled Meds: . antiseptic oral rinse  7 mL Mouth Rinse q12n4p  . aspirin EC  81 mg Oral q morning - 10a  . chlorhexidine  15 mL Mouth Rinse BID  . Chlorhexidine Gluconate Cloth  6 each Topical Q0600  . enoxaparin (LOVENOX) injection  80 mg Subcutaneous Q24H  . ertapenem  1 g Intravenous Q24H  . ezetimibe  10 mg Oral Daily  . fluconazole (DIFLUCAN) IV  100 mg Intravenous Q24H  . furosemide  40  mg Oral Daily  . insulin aspart  0-20 Units Subcutaneous TID WC  . insulin aspart  7 Units Subcutaneous TID WC  . insulin NPH Human  28 Units Subcutaneous BID AC & HS  . isosorbide mononitrate  60 mg Oral Daily  . lisinopril  5 mg Oral Daily  . loratadine  10 mg Oral Daily  . metoprolol  50 mg Oral BID  . mupirocin ointment  1 application Nasal BID  . pantoprazole  40 mg Oral Daily  . rosuvastatin  20 mg Oral q1800   Continuous Infusions:     Time spent: 25 minutes    Markies Mowatt, [place]  [facility] Pager [phone]. If 7PM-7AM, please contact night-coverage at [email or web address], password [facility] 09/11/2015, 11:40 AM  LOS: 3 days

## 2015-09-12 DIAGNOSIS — N390 Urinary tract infection, site not specified: Secondary | ICD-10-CM

## 2015-09-12 DIAGNOSIS — B372 Candidiasis of skin and nail: Secondary | ICD-10-CM | POA: Diagnosis present

## 2015-09-12 DIAGNOSIS — B9689 Other specified bacterial agents as the cause of diseases classified elsewhere: Secondary | ICD-10-CM | POA: Diagnosis present

## 2015-09-12 LAB — GLUCOSE, CAPILLARY
GLUCOSE-CAPILLARY: 174 mg/dL — AB (ref 65–99)
GLUCOSE-CAPILLARY: 196 mg/dL — AB (ref 65–99)
Glucose-Capillary: 145 mg/dL — ABNORMAL HIGH (ref 65–99)

## 2015-09-12 MED ORDER — FLUCONAZOLE 100 MG PO TABS
100.0000 mg | ORAL_TABLET | Freq: Every day | ORAL | Status: DC
  Administered 2015-09-12 – 2015-09-14 (×3): 100 mg via ORAL
  Filled 2015-09-12 (×3): qty 1

## 2015-09-12 MED ORDER — SODIUM CHLORIDE 0.9 % IV SOLN: 1.0000 g | INTRAVENOUS | Status: AC

## 2015-09-12 MED ORDER — SODIUM CHLORIDE 0.9 % IJ SOLN
10.0000 mL | Freq: Two times a day (BID) | INTRAMUSCULAR | Status: DC
  Administered 2015-09-12 – 2015-09-15 (×4): 10 mL

## 2015-09-12 MED ORDER — CHLORHEXIDINE GLUCONATE CLOTH 2 % EX PADS: 6.0000 | MEDICATED_PAD | Freq: Every day | CUTANEOUS | Status: DC

## 2015-09-12 MED ORDER — SODIUM CHLORIDE 0.9 % IJ SOLN
10.0000 mL | INTRAMUSCULAR | Status: DC | PRN
  Administered 2015-09-14: 30 mL
  Administered 2015-09-14: 10 mL
  Administered 2015-09-15: 30 mL
  Filled 2015-09-12 (×3): qty 40

## 2015-09-12 MED ORDER — FLUCONAZOLE 100 MG PO TABS: 100.0000 mg | ORAL_TABLET | Freq: Every day | ORAL | Status: DC

## 2015-09-12 MED ORDER — INSULIN NPH (HUMAN) (ISOPHANE) 100 UNIT/ML ~~LOC~~ SUSP: 28.0000 [IU] | Freq: Two times a day (BID) | SUBCUTANEOUS | Status: DC

## 2015-09-12 NOTE — Progress Notes (Signed)
[facility]  Patient Status: New patient with [facility] for Home Infusion Pharmacy  [facility] is providing the following services: Home Infusion Pharmacy services for home IV Invanz x 10 days as ordered.  [facility] will partner with [facility], patients [facility] Agency of choice, to provide pharmacy services.  [facility] hospital infusion coordinator provided in hospital hands on teaching regarding IV ABX set up and administration with the pt today to support independence at home.  Contacted Corliss Blacker, RN, MSN with [facility] who is prepared to support [facility] tomorrow around 3 PM at home.    If patient discharges after hours, please call [phone].   [name] 09/12/2015, 4:09 PM

## 2015-09-12 NOTE — Progress Notes (Signed)
PT Cancellation Note  Patient Details Name: [name] MRN: [id number] DOB: [birth year]   Cancelled Treatment:    Reason Eval/Treat Not Completed: Attempted PT tx session. After RN entered room, pt stated he wanted to have discussion with her concerning d/c and home health setup. Pt asked PT to leave room while he talked with RN and to check back another time. Will check back another day. Likely unable to return on today.   Weston Anna, MPT Pager: [phone]

## 2015-09-12 NOTE — Care Management Note (Signed)
Case Management Note  Patient Details  Name: [name] MRN: [id number] Date of Birth: September 28, 1945  Subjective/Objective: Patient for PICC insertion today, then d/c home.Await HHC/iv abx orders.HHRN-specific picc flush, labs,iv abx management.TC [facility] intake spoke to [place] tel#[phone] about d/c plans w/HHRN-iv abx, also resumption of HHPT/OT/aide(already ordered).  [facility] can only provide HHRN, they do not have a pharmacy for the iv abx-TC [facility] iv liason(Karen) left message-await call back.  Will fax orders once received, & confirm w/HHC agency-[facility], & [facility] pharmacy for the abx. Patient is home alone, but willing to be taught, he does not want to go to a SNF.  Patient will need ambulance transp home.                   Action/Plan:   Expected Discharge Date:   (UNKNOWN)               Expected Discharge Plan:  [place]  In-House Referral:  NA  Discharge planning Services  CM Consult  Post Acute Care Choice:  Home Health Choice offered to:  Patient  DME Arranged:  N/A DME Agency:  NA  HH Arranged:  RN, PT, OT, Nurse's Aide HH Agency:  [place]  Status of Service:  In process, will continue to follow  Medicare Important Message Given:    Date Medicare IM Given:    Medicare IM give by:    Date Additional Medicare IM Given:    Additional Medicare Important Message give by:     If discussed at [place] of Stay Meetings, dates discussed:    Additional Comments:  Dessa Phi, RN 09/12/2015, 9:46 AM

## 2015-09-12 NOTE — Care Management Note (Signed)
Case Management Note  Patient Details  Name: ZIARE BEFORT MRN: [id number] Date of Birth: 01-14-1946  Subjective/Objective: [facility] iv therapy liason Pam following for home iv abx-pharmacy for the med, after picc placed. Faxed w/confirmation to [place] [facility]=HHRN/PT/OT order w/confirmation to fax#[phone].                   Action/Plan:d/c plan home w/HHC/iv abx.   Expected Discharge Date:   (UNKNOWN)               Expected Discharge Plan:  [place]  In-House Referral:  NA  Discharge planning Services  CM Consult  Post Acute Care Choice:  Home Health Choice offered to:  Patient  DME Arranged:  N/A DME Agency:  NA  HH Arranged:  RN, PT, OT, Nurse's Aide [place] Agency:  [place]  Status of Service:  In process, will continue to follow  Medicare Important Message Given:    Date Medicare IM Given:    Medicare IM give by:    Date Additional Medicare IM Given:    Additional Medicare Important Message give by:     If discussed at [place] of Stay Meetings, dates discussed:    Additional Comments:  Dessa Phi, RN 09/12/2015, 4:07 PM

## 2015-09-12 NOTE — Progress Notes (Addendum)
Arrived to insert PICC so he could be discharged today.   He is refusing to have PICC done right now.   "I don't want to have it done now."   I explained to him that I was going to [facility] to insert PICCs there and that I may not get back here today and it could delay him being discharged.   "I'm don't care".   "I'm ok with that".   I informed primary RN, Nira Conn of situation.  The PICC nurses came yesterday (1/1) and he refused to let them insert the PICC.

## 2015-09-12 NOTE — Progress Notes (Signed)
Peripherally Inserted Central Catheter/Midline Placement  The IV Nurse has discussed with the patient and/or persons authorized to consent for the patient, the purpose of this procedure and the potential benefits and risks involved with this procedure.  The benefits include less needle sticks, lab draws from the catheter and patient may be discharged home with the catheter.  Risks include, but not limited to, infection, bleeding, blood clot (thrombus formation), and puncture of an artery; nerve damage and irregular heat beat.  Alternatives to this procedure were also discussed.  PICC/Midline Placement Documentation        [name] 09/12/2015, 7:10 PM

## 2015-09-12 NOTE — Progress Notes (Signed)
PHARMACIST - PHYSICIAN COMMUNICATION DR:   [place] CONCERNING: Antibiotic IV to Oral Route Change Policy  RECOMMENDATION: This patient is receiving Diflucan by the intravenous route.  Based on criteria approved by the Pharmacy and Therapeutics Committee, the antibiotic(s) is/are being converted to the equivalent oral dose form(s).   DESCRIPTION: These criteria include:  Patient being treated for a respiratory tract infection, urinary tract infection, cellulitis or clostridium difficile associated diarrhea if on metronidazole  The patient is not neutropenic and does not exhibit a GI malabsorption state  The patient is eating (either orally or via tube) and/or has been taking other orally administered medications for a least 24 hours  The patient is improving clinically and has a Tmax < 100.5  If you have questions about this conversion, please contact the Pharmacy Department  []   [phone] )  [facility] []   [phone] )  [facility] []   [phone] )  [facility] []   [phone] )  [facility] [x]   [phone] )  [place], PharmD, [place] Pager [phone] 09/12/2015 10:13 AM

## 2015-09-12 NOTE — Discharge Summary (Addendum)
Physician Discharge Summary  [name] [id number] DOB: 29-Mar-1946 DOA: 09/07/2015  PCP: Brian Shelling, MD  Admit date: 09/07/2015 Discharge date: 09/13/2015  Time spent: 35 minutes  Recommendations for Outpatient Follow-up:  #1 Discharge to skilled nursing facility. Patient will receive 8 more days of IV ertapenem to complete 2 weeks of antibiotic. (Stop date 09/22/2015) #2 Stop date of fluconazole 09/16/2015 #3 Please monitor CBC and compensated metabolic panel every week while on antibiotics.   Discharge Diagnoses:  Principal Problem:   Urinary tract infection due to extended-spectrum beta lactamase (ESBL)-producing Klebsiella  Active Problems:   Diabetic neuropathy (HCC)   Chronic indwelling Foley catheter   Chronic diastolic CHF (congestive heart failure) (HCC)   Diabetes type 2, uncontrolled (HCC)   UTI (lower urinary tract infection)   Neurogenic bladder   Hyperlipidemia LDL goal <70   Type II diabetes mellitus with peripheral circulatory disorder, uncontrolled (HCC)   Morbid obesity (HCC)   Candidiasis, intertriginous    Sinus Pause   Discharge Condition: Fair  Diet recommendation: Diabetic  Filed Weights   09/13/15 0526 09/14/15 0851 09/15/15 0513  Weight: 159.938 kg (352 lb 9.6 oz) 160.921 kg (354 lb 12.3 oz) 160.709 kg (354 lb 4.8 oz)    History of present illness:  Please refer to admission H&P for details, in brief,70 year old morbidly obese male with type 2 diabetes mellitus, hypertension, chronic indwelling Foley catheter due to neurogenic bladder with recurrent UTIs, essential hypertension presented to ED after noticing pus in his catheter for several days. He also had productive cough of yellow sputum. Denied any fevers, chills, shortness of breath, chest pain, abdominal pain, dysuria or diarrhea. In the ED he had to receive 12,000 with UA positive for klebsiella ESBL UTI. Started on empiric cefepime , then switched to ertapenem.  Hospital  Course:  Klebsiella ESBL UTI with chronic indwelling Foley catheter abx switched to ertapenem 1 gm daily. Right midline placed. Initially plan to be discharged home with home health but decided to go to skilled nursing facility. He'll complete 2 weeks course of antibiotics with ertapenem on 1/12.  Recommend CBC and BMET every week while on antibiotics.   Productive cough CT chest unremarkable. Stable with antitussives.  Uncontrolled Type 2 diabetes mellitus with peripheral vascular disease, neurogenic bladder and neuropathy Appears to have dietary nonadherence. Lantus increased to 28 units. A1c >10.   Bilateral inguinal area dermatitis Appears to be fungal infection. Added Diflucan. Appreciate wound care recommendations.  Chronic diastolic CHF/Sinus pause Appears euvolemic. Continue Lasix. Continue lisinopril and metoprolol-reduced dose of Lopressor per cardiology recommendations. Needs to follow with cardiology upon discharge for further adjustment of medications. He was seen by cardiology after he developed sinus pauses lasting less than 3 minutes and Lopressor dose was reduced prior to discharge. He also received magnesium supplements.  Essential hypertension Stable. Continue home medications  Dyslipidemia Continue Zetia and statin.  Right heel pressure ulcer Appreciate wound care evaluation.  Morbid obesity      Diet: Diabetic/heart healthy  Code Status: Full code Family Communication: None at bedside Disposition Plan: To nursing facility   Consultants:  Cardiology  Procedures:  CT chest without contrast  Antibiotics:  IV cefepime since 12/28-12-30  Iv ertapenem 12/30-1/12    Discharge Exam: Filed Vitals:   09/14/15 2103 09/15/15 0513  BP: 117/60 111/65  Pulse: 65 64  Temp: 97.4 F (36.3 C) 97.2 F (36.2 C)  Resp: 20 20     General: Elderly obese male not in distress  HEENT: ,  moist mucosa  Chest: Clear bilaterally   CVS: Normal S1  and S2, no murmurs  GI: Soft, nondistended, nontender, chronic Foley catheter draining clear urine, candidiasis over her abdominal folds  Musculoskeletal: Warm, no edema, right heel ulcer appears clean. Foley catheter in place  Discharge Instructions   Discharge Instructions    Diet - low sodium heart healthy    Complete by:  As directed      Discharge instructions    Complete by:  As directed   Please Follow with cardiology in 1-2 weeks- [facility].     Face-to-face encounter (required for Medicare/Medicaid patients)    Complete by:  As directed   I [name], [name] certify that this patient is under my care and that I, or a nurse practitioner or physician's assistant working with me, had a face-to-face encounter that meets the physician face-to-face encounter requirements with this patient on 09/12/2015. The encounter with the patient was in whole, or in part for the following medical condition(s) which is the primary reason for home health care (List medical condition):home IV infusion therapy, , morbid obesity, uncontrolled diabetes.  please check weekly CBC and comprehensive metabolic panel while on IV antibiotics and fax results to PCP office.  The encounter with the patient was in whole, or in part, for the following medical condition, which is the primary reason for home health care:  IV antibiotics, morbid obesity, uncontrolled diabetes  I certify that, based on my findings, the following services are medically necessary home health services:   Nursing Physical therapy    Reason for Medically Necessary Home Health Services:   Skilled Nursing- Assessment and Training for Infusion Therapy, Line Care, and Infection Control Skilled Nursing- Teaching of Disease Process/Symptom Management    My clinical findings support the need for the above services:  Unable to leave home safely without assistance and/or assistive device  Further, I certify that my clinical findings support that  this patient is homebound due to:  Unable to leave home safely without assistance     Home Health    Complete by:  As directed   To provide the following care/treatments:   PT RN       Increase activity slowly    Complete by:  As directed           Current Discharge Medication List    START taking these medications   Details  Chlorhexidine Gluconate Cloth 2 % PADS Apply 6 each topically daily at 6 (six) AM. Qty: 5 each, Refills: 0    ertapenem 1 g in sodium chloride 0.9 % 50 mL Inject 1 g into the vein daily. Qty: 10 Dose, Refills: 0    fluconazole (DIFLUCAN) 100 MG tablet Take 1 tablet (100 mg total) by mouth at bedtime. Qty: 2 tablet, Refills: 0    magnesium oxide (MAG-OX) 400 (241.3 Mg) MG tablet Take 1 tablet (400 mg total) by mouth daily. Qty: 10 tablet, Refills: 0      CONTINUE these medications which have CHANGED   Details  insulin NPH Human (HUMULIN N) 100 UNIT/ML injection Inject 0.28 mLs (28 Units total) into the skin 2 (two) times daily at 8 am and 10 pm. Qty: 10 mL, Refills: 11    metoprolol (LOPRESSOR) 25 MG tablet Take 0.5 tablets (12.5 mg total) by mouth 2 (two) times daily. Qty: 30 tablet, Refills: 0      CONTINUE these medications which have NOT CHANGED   Details  acetaminophen (TYLENOL) 325 MG tablet  Take 2 tablets (650 mg total) by mouth every 4 (four) hours as needed for headache or mild pain.    aspirin EC 81 MG tablet Take 81 mg by mouth every morning.     calcium carbonate (TUMS - DOSED IN MG ELEMENTAL CALCIUM) 500 MG chewable tablet Chew 1 tablet (200 mg of elemental calcium total) by mouth 3 (three) times daily as needed for indigestion or heartburn.    diphenoxylate-atropine (LOMOTIL) 2.5-0.025 MG tablet Take 1 tablet by mouth 3 (three) times daily as needed for diarrhea or loose stools.  Refills: 2    ezetimibe (ZETIA) 10 MG tablet Take 1 tablet (10 mg total) by mouth daily. Qty: 30 tablet, Refills: 11    fexofenadine (ALLEGRA) 180 MG  tablet Take 180 mg by mouth daily as needed for allergies or rhinitis.    furosemide (LASIX) 40 MG tablet Take 1 tablet (40 mg total) by mouth daily. Qty: 30 tablet, Refills: 11    isosorbide mononitrate (IMDUR) 60 MG 24 hr tablet Take 1 tablet (60 mg total) by mouth daily.    lisinopril (PRINIVIL,ZESTRIL) 5 MG tablet Take 5 mg by mouth daily. Refills: 3    nitroGLYCERIN (NITROSTAT) 0.4 MG SL tablet Place 1 tablet (0.4 mg total) under the tongue every 5 (five) minutes as needed for chest pain. Qty: 25 tablet, Refills: 2    omeprazole (PRILOSEC) 20 MG capsule Take 20 mg by mouth daily.    oxybutynin (DITROPAN) 5 MG tablet Take 5 mg by mouth daily as needed for bladder spasms.    rosuvastatin (CRESTOR) 20 MG tablet Take 1 tablet (20 mg total) by mouth daily at 6 PM.    senna-docusate (SENOKOT-S) 8.6-50 MG per tablet Take 2 tablets by mouth daily. Qty: 28 tablet, Refills: 0      STOP taking these medications     insulin aspart (NOVOLOG) 100 UNIT/ML injection        Allergies  Allergen Reactions  . Ace Inhibitors Swelling    Pt tolerates lisinopril  . Lipitor [Atorvastatin Calcium] Swelling  . Metformin And Related Swelling  . Cefadroxil Hives  . Cephalexin Hives  . Morphine And Related Other (See Comments)    Sweating, feels like is "in rocky boat."  . Robaxin [Methocarbamol] Other (See Comments)    Feels like he is shaky   Follow-up Information    Follow up with Brian Shelling, MD. Schedule an appointment as soon as possible for a visit in 1 week.   Specialty:  Internal Medicine   Contact information:   [address] [place] [address] [phone]       Follow up with [facility].   Why:  HHRN/PT/OT   Contact information:   [address] [place] [address] [phone]       Follow up with [facility].   Why:  pharmacy for iv abx.   Contact information:   [address] [place]  [address] [phone]       Follow up with [facility] .   Specialty:  [place] information:   [address] [place] [phone]       The results of significant diagnostics from this hospitalization (including imaging, microbiology, ancillary and laboratory) are listed below for reference.    Significant Diagnostic Studies: Ct Chest Wo Contrast  09/07/2015  CLINICAL DATA:  Inpatient with dyspnea and productive cough. History  of CHF. EXAM: CT CHEST WITHOUT CONTRAST TECHNIQUE: Multidetector CT imaging of the chest was performed following the standard protocol without IV contrast. COMPARISON:  09/14/2014 chest CT angiogram. FINDINGS: Mediastinum/Nodes: Normal heart size. No pericardial fluid/thickening. Coronary atherosclerosis. Great vessels are normal in course and caliber. Normal visualized thyroid. Normal esophagus. No pathologically enlarged axillary, mediastinal or gross right hilar lymph nodes, noting limited sensitivity for the detection of hilar adenopathy on this noncontrast study. There is questionable fullness in the left suprahilar region. Lungs/Pleura: No pneumothorax. No pleural effusion. Left upper lobe 3 mm solid pulmonary nodule (series 5/ image 9), stable since 09/14/2014. No acute consolidative airspace disease, additional significant pulmonary nodules or lung masses. Upper abdomen: Partially visualized are postsurgical changes in the proximal stomach, with limited visualization due to streak artifact from surgical clips. Musculoskeletal: No aggressive appearing focal osseous lesions. Moderate degenerative changes in the thoracic spine. Stable symmetric moderate gynecomastia. IMPRESSION: 1. Questionable fullness in the left suprahilar region, cannot exclude left hilar adenopathy. Recommend further characterization with chest CT with IV contrast. 2. Left upper lobe 3 mm solid pulmonary nodule, for which 11 month  stability has been demonstrated. If the patient is at high risk for bronchogenic carcinoma, follow-up chest CT at 1 year is recommended. If the patient is at low risk, no follow-up is needed. This recommendation follows the consensus statement: Guidelines for Management of Small Pulmonary Nodules Detected on CT Scans: A Statement from the [employer] as published in Radiology 2005; 237:395-400. 3. No acute consolidative airspace disease. 4. Coronary atherosclerosis. Electronically Signed   By: Ilona Sorrel M.D.   On: 09/07/2015 22:08    Microbiology: Recent Results (from the past 240 hour(s))  Urine culture     Status: None   Collection Time: 09/07/15 12:33 PM  Result Value Ref Range Status   Specimen Description URINE, CATHETERIZED  Final   Special Requests Immunocompromised  Final   Culture   Final    >=100,000 COLONIES/mL KLEBSIELLA PNEUMONIAE Confirmed Extended Spectrum Beta-Lactamase Producer (ESBL) Performed at [facility]    Report Status 09/11/2015 FINAL  Final   Organism ID, Bacteria KLEBSIELLA PNEUMONIAE  Final      Susceptibility   Klebsiella pneumoniae - MIC*    AMPICILLIN >=32 RESISTANT Resistant     CEFAZOLIN >=64 RESISTANT Resistant     CEFTRIAXONE >=64 RESISTANT Resistant     CIPROFLOXACIN >=4 RESISTANT Resistant     GENTAMICIN >=16 RESISTANT Resistant     IMIPENEM <=0.25 SENSITIVE Sensitive     NITROFURANTOIN 128 RESISTANT Resistant     TRIMETH/SULFA >=320 RESISTANT Resistant     AMPICILLIN/SULBACTAM >=32 RESISTANT Resistant     PIP/TAZO 16 SENSITIVE Sensitive     * >=100,000 COLONIES/mL KLEBSIELLA PNEUMONIAE  Urine culture     Status: None   Collection Time: 09/07/15 12:33 PM  Result Value Ref Range Status   Specimen Description URINE, CATHETERIZED  Final   Special Requests NONE  Final   Culture   Final    >=100,000 COLONIES/mL KLEBSIELLA PNEUMONIAE SUSCEPTIBILITIES PERFORMED ON PREVIOUS CULTURE WITHIN THE LAST 5 DAYS. Performed at [facility]    Report Status 09/11/2015 FINAL  Final  MRSA PCR Screening     Status: Abnormal   Collection Time: 09/08/15 10:06 AM  Result Value Ref Range Status   MRSA by PCR POSITIVE (A) NEGATIVE Final    Comment:        The GeneXpert MRSA Assay (FDA approved for NASAL specimens only), is one component  of a comprehensive MRSA colonization surveillance program. It is not intended to diagnose MRSA infection nor to guide or monitor treatment for MRSA infections. RESULT CALLED TO, READ BACK BY AND VERIFIED WITH: THIGPEN,J @1359  ON [id number] BY POTEAT,S      Labs: Basic Metabolic Panel:  Recent Labs Lab 09/09/15 0500 09/10/15 0441 09/13/15 1730 09/14/15 0436 09/15/15 0413  NA 137 135 138  --  137  K 3.8 3.8 4.4  --  4.2  CL 101 99* 103  --  100*  CO2 29 28 28   --  30  GLUCOSE 197* 255* 165*  --  125*  BUN 13 13 18   --  17  CREATININE 0.72 0.67 0.88 0.58* 0.72  CALCIUM 8.7* 8.6* 8.7*  --  8.4*  MG  --   --  1.6*  --  1.8  PHOS  --   --   --   --  3.5   Liver Function Tests: No results for input(s): AST, ALT, ALKPHOS, BILITOT, PROT, ALBUMIN in the last 168 hours. No results for input(s): LIPASE, AMYLASE in the last 168 hours. No results for input(s): AMMONIA in the last 168 hours. CBC:  Recent Labs Lab 09/09/15 0500 09/10/15 0441 09/15/15 0413  WBC 8.7 7.5 9.0  HGB 12.4* 12.2* 12.4*  HCT 36.8* 36.6* 37.6*  MCV 91.1 89.3 91.3  PLT 240 229 227   Cardiac Enzymes: No results for input(s): CKTOTAL, CKMB, CKMBINDEX, TROPONINI in the last 168 hours. BNP: BNP (last 3 results)  Recent Labs  12/22/14 2330 07/02/15 1439  BNP 120.1* 320.3*    ProBNP (last 3 results) No results for input(s): PROBNP in the last 8760 hours.  CBG:  Recent Labs Lab 09/14/15 1151 09/14/15 1709 09/14/15 2100 09/15/15 0742 09/15/15 1152  GLUCAP 108* 142* 195* 102* 116*       Signed:  Estella Malatesta MD   [facility] 09/15/2015, 1:06 PM

## 2015-09-13 LAB — BASIC METABOLIC PANEL
ANION GAP: 7 (ref 5–15)
BUN: 18 mg/dL (ref 6–20)
CALCIUM: 8.7 mg/dL — AB (ref 8.9–10.3)
CO2: 28 mmol/L (ref 22–32)
Chloride: 103 mmol/L (ref 101–111)
Creatinine, Ser: 0.88 mg/dL (ref 0.61–1.24)
Glucose, Bld: 165 mg/dL — ABNORMAL HIGH (ref 65–99)
Potassium: 4.4 mmol/L (ref 3.5–5.1)
SODIUM: 138 mmol/L (ref 135–145)

## 2015-09-13 LAB — GLUCOSE, CAPILLARY
GLUCOSE-CAPILLARY: 142 mg/dL — AB (ref 65–99)
GLUCOSE-CAPILLARY: 147 mg/dL — AB (ref 65–99)
GLUCOSE-CAPILLARY: 142 mg/dL — AB (ref 65–99)
Glucose-Capillary: 182 mg/dL — ABNORMAL HIGH (ref 65–99)

## 2015-09-13 LAB — HEMOGLOBIN A1C
Hgb A1c MFr Bld: 10.4 % — ABNORMAL HIGH (ref 4.8–5.6)
MEAN PLASMA GLUCOSE: 252 mg/dL

## 2015-09-13 LAB — MAGNESIUM: MAGNESIUM: 1.6 mg/dL — AB (ref 1.7–2.4)

## 2015-09-13 NOTE — Care Management Important Message (Signed)
Important Message  Patient Details IM Letter given to Nora/Case Manager to present to [facility] Message  Patient Details  Name: SELDEN ROMO MRN: [id number] Date of Birth: 20-Sep-1945   Medicare Important Message Given:  Yes    Camillo Flaming 09/13/2015, 2:09 PM Name: NATTHAN MCLAURY MRN: [id number] Date of Birth: 1946-08-22   Medicare Important Message Given:  Yes    Camillo Flaming 09/13/2015, 2:09 PM

## 2015-09-13 NOTE — Progress Notes (Signed)
Orthostatic vitals performed, no drop in BP noted. Patient stood up with use of walker and did fine however once we moved him from the bed to the chair he appeared to lose consciousness for a few seconds then came right back to.  Patient states he feels nothing when these episodes happen.

## 2015-09-13 NOTE — Progress Notes (Signed)
[name] continuing to follow.   [name] received notification from RN that pt had a syncopal episode and pt discharge cancelled.   [name] updated [facility] and facility agreeable to accept pt when pt becomes medically ready.  [name] to continue to follow.  [name], MSW, [facility] [employer] [phone]

## 2015-09-13 NOTE — Progress Notes (Signed)
[facility] PROGRESS NOTE  Mackinley Bullen Douthit [id number] DOB: 1946-09-02 DOA: 09/07/2015 PCP: Irven Shelling, MD  Please see d/c summary from 1/2 for details.   Assessment/Plan: Klebsiella ESBL UTI with chronic indwelling Foley catheter Midline placed. D/c on ertapenem for 2 wweks course Recommend CBC and CMET weekly while on antibiotics.    Uncontrolled Type 2 diabetes mellitus with peripheral vascular disease, neurogenic bladder and neuropathy Appears to have dietary nonadherence. Lantus increased to 28 units. A1c 10.4. counseled on diet adherence. ( patient non adherent)  Bilateral inguinal area dermatitis On diflucan  Stable for D/C hme with HH  Objective: Filed Vitals:   09/12/15 2057 09/13/15 0526  BP: 139/74 164/69  Pulse: 72 61  Temp: 97.9 F (36.6 C) 97.5 F (36.4 C)  Resp: 16 16    Intake/Output Summary (Last 24 hours) at 09/13/15 0847 Last data filed at 09/13/15 0528  Gross per 24 hour  Intake   1712 ml  Output   3350 ml  Net  -1638 ml   Filed Weights   09/08/15 1400 09/13/15 0526  Weight: 158.759 kg (350 lb) 159.938 kg (352 lb 9.6 oz)    Exam:   General:  NAD  Cardiovascular:NS1&S2  Respiratory:clear b/l  Abdomen: fungal infection of abdominal folds, foley+  Musculoskeletal: warm, no edema  Data Reviewed: Basic Metabolic Panel:  Recent Labs Lab 09/07/15 1123 09/07/15 1700 09/08/15 0438 09/09/15 0500 09/10/15 0441  NA 134*  --  132* 137 135  K 4.3  --  3.8 3.8 3.8  CL 98*  --  98* 101 99*  CO2 28  --  27 29 28   GLUCOSE 356*  --  335* 197* 255*  BUN 12  --  12 13 13   CREATININE 0.67 0.62 0.65 0.72 0.67  CALCIUM 8.8*  --  7.8* 8.7* 8.6*   Liver Function Tests:  Recent Labs Lab 09/07/15 1123  AST 11*  ALT 12*  ALKPHOS 116  BILITOT 0.9  PROT 7.0  ALBUMIN 3.1*    Recent Labs Lab 09/07/15 1123  LIPASE 22   No results for input(s): AMMONIA in the last 168 hours. CBC:  Recent Labs Lab 09/07/15 1123  09/07/15 1700 09/08/15 0438 09/09/15 0500 09/10/15 0441  WBC 12.1* 10.3 9.1 8.7 7.5  HGB 13.9 12.9* 11.7* 12.4* 12.2*  HCT 41.2 38.0* 35.0* 36.8* 36.6*  MCV 90.9 90.9 89.7 91.1 89.3  PLT 231 208 220 240 229   Cardiac Enzymes:  Recent Labs Lab 09/07/15 1700 09/07/15 2145 09/08/15 0438  TROPONINI <0.03 <0.03 <0.03   BNP (last 3 results)  Recent Labs  09/14/14 0644 12/22/14 2330 07/02/15 1439  BNP 95.8 120.1* 320.3*    ProBNP (last 3 results) No results for input(s): PROBNP in the last 8760 hours.  CBG:  Recent Labs Lab 09/12/15 0748 09/12/15 1224 09/12/15 1631 09/12/15 2057 09/13/15 0743  GLUCAP 174* 196* 145* 182* 142*    Recent Results (from the past 240 hour(s))  Urine culture     Status: None   Collection Time: 09/07/15 12:33 PM  Result Value Ref Range Status   Specimen Description URINE, CATHETERIZED  Final   Special Requests Immunocompromised  Final   Culture   Final    >=100,000 COLONIES/mL KLEBSIELLA PNEUMONIAE Confirmed Extended Spectrum Beta-Lactamase Producer (ESBL) Performed at [facility]    Report Status 09/11/2015 FINAL  Final   Organism ID, Bacteria KLEBSIELLA PNEUMONIAE  Final      Susceptibility   Klebsiella pneumoniae - MIC*  AMPICILLIN >=32 RESISTANT Resistant     CEFAZOLIN >=64 RESISTANT Resistant     CEFTRIAXONE >=64 RESISTANT Resistant     CIPROFLOXACIN >=4 RESISTANT Resistant     GENTAMICIN >=16 RESISTANT Resistant     IMIPENEM <=0.25 SENSITIVE Sensitive     NITROFURANTOIN 128 RESISTANT Resistant     TRIMETH/SULFA >=320 RESISTANT Resistant     AMPICILLIN/SULBACTAM >=32 RESISTANT Resistant     PIP/TAZO 16 SENSITIVE Sensitive     * >=100,000 COLONIES/mL KLEBSIELLA PNEUMONIAE  Urine culture     Status: None   Collection Time: 09/07/15 12:33 PM  Result Value Ref Range Status   Specimen Description URINE, CATHETERIZED  Final   Special Requests NONE  Final   Culture   Final    >=100,000 COLONIES/mL KLEBSIELLA  PNEUMONIAE SUSCEPTIBILITIES PERFORMED ON PREVIOUS CULTURE WITHIN THE LAST 5 DAYS. Performed at [facility]    Report Status 09/11/2015 FINAL  Final  MRSA PCR Screening     Status: Abnormal   Collection Time: 09/08/15 10:06 AM  Result Value Ref Range Status   MRSA by PCR POSITIVE (A) NEGATIVE Final    Comment:        The GeneXpert MRSA Assay (FDA approved for NASAL specimens only), is one component of a comprehensive MRSA colonization surveillance program. It is not intended to diagnose MRSA infection nor to guide or monitor treatment for MRSA infections. RESULT CALLED TO, READ BACK BY AND VERIFIED WITH: THIGPEN,J @1359  ON [id number] BY POTEAT,S      Studies: No results found.  Scheduled Meds: . antiseptic oral rinse  7 mL Mouth Rinse q12n4p  . aspirin EC  81 mg Oral q morning - 10a  . chlorhexidine  15 mL Mouth Rinse BID  . Chlorhexidine Gluconate Cloth  6 each Topical Q0600  . enoxaparin (LOVENOX) injection  80 mg Subcutaneous Q24H  . ertapenem  1 g Intravenous Q24H  . ezetimibe  10 mg Oral Daily  . fluconazole  100 mg Oral QHS  . furosemide  40 mg Oral Daily  . insulin aspart  0-20 Units Subcutaneous TID WC  . insulin aspart  7 Units Subcutaneous TID WC  . insulin NPH Human  28 Units Subcutaneous BID AC & HS  . isosorbide mononitrate  60 mg Oral Daily  . lisinopril  5 mg Oral Daily  . loratadine  10 mg Oral Daily  . metoprolol  50 mg Oral BID  . mupirocin ointment  1 application Nasal BID  . pantoprazole  40 mg Oral Daily  . rosuvastatin  20 mg Oral q1800  . sodium chloride  10-40 mL Intracatheter Q12H   Continuous Infusions:    Time spent: 20 minutes    Ashlley Booher, [place]  [facility] Pager [phone] If 7PM-7AM, please contact night-coverage at [email or web address], password [name] 09/13/2015, 8:47 AM  LOS: 5 days

## 2015-09-13 NOTE — NC FL2 (Signed)
[place] LEVEL OF CARE SCREENING TOOL     IDENTIFICATION  Patient Name: [name] Birthdate: 08-20-1946 Sex: male Admission Date (Current Location): 09/07/2015  [facility] and [place] Number:  Herbalist and Address:  [facility],  [place] [address] [place]      Provider Number: [id number]  Attending Physician Name and Address:  Louellen Molder, MD  Relative Name and Phone Number:       Current Level of Care: Hospital Recommended Level of Care: [place] Prior Approval Number:    Date Approved/Denied:   PASRR Number: [id number]  Discharge Plan: SNF    Current Diagnoses: Patient Active Problem List   Diagnosis Date Noted  . Urinary tract infection due to extended-spectrum beta lactamase (ESBL)-producing Klebsiella 09/12/2015  . Candidiasis, intertriginous 09/12/2015  . Morbid obesity ([place]) 07/03/2015  . Type II diabetes mellitus with peripheral circulatory disorder, uncontrolled ([place]) 05/09/2015  . Hyperlipidemia LDL goal <70 05/02/2015  . Abnormal nuclear stress test 05/02/2015  . Neurogenic bladder 04/26/2015  . Pressure ulcer 04/15/2015  . UTI (lower urinary tract infection) 04/14/2015  . History of DVT (deep vein thrombosis) 12/08/2014  . Right-sided abdominal pain of unknown cause   . Diabetes type 2, uncontrolled ([place])   . Chronic diastolic CHF (congestive heart failure) ([place]) 04/01/2014  . Chest pain with moderate risk of acute coronary syndrome 12/23/2013  . Chronic indwelling Foley catheter 12/23/2013  . CVA (cerebral infarction) 03/11/2012  . NSVT (nonsustained ventricular tachycardia) ([place]) 10/26/2011  . Diabetic neuropathy ([place]) 10/10/2011  . OSA (obstructive sleep apnea) 10/10/2011    Orientation RESPIRATION BLADDER Height & Weight    Self, Time, Situation, Place  Normal Indwelling catheter 6\' 1"  (185.4 cm) 352 lbs.  BEHAVIORAL SYMPTOMS/MOOD NEUROLOGICAL BOWEL NUTRITION STATUS   (no behaviors)  (no seizures) Incontinent Diet (Carb Modified)  AMBULATORY STATUS COMMUNICATION OF NEEDS Skin   Extensive Assist Verbally Other (Comment), PU Stage and Appropriate Care (Stage II Right Heel Daily dressing changes; Diabetic ulcer right 2nd and 3rd toe, wound bed red)   PU Stage 2 Dressing: Daily                   Personal Care Assistance Level of Assistance  Bathing, Feeding, Dressing Bathing Assistance: Maximum assistance Feeding assistance: Independent Dressing Assistance: Maximum assistance     Functional Limitations Info  Sight, Hearing, Speech Sight Info: Impaired Hearing Info: Adequate Speech Info: Adequate    SPECIAL CARE FACTORS FREQUENCY  PT (By licensed PT), OT (By licensed OT)     PT Frequency: 5 x a week OT Frequency: 5 x a week            Contractures Contractures Info: Not present    Additional Factors Info  Code Status, Allergies, Isolation Precautions Code Status Info: FULL code status Allergies Info: Ace Inhibitors, Lipitor, Metformin And Related, Cefadroxil, Cephalexin, Morphine And Related, Robaxin     Isolation Precautions Info: 09/08/15 mrsa by pcr; MDRO     Current Medications (09/13/2015):  This is the current hospital active medication list Current Facility-Administered Medications  Medication Dose Route Frequency Provider Last Rate Last Dose  . acetaminophen (TYLENOL) tablet 650 mg  650 mg Oral Q4H PRN Theodis Blaze, MD      . antiseptic oral rinse (CPC / CETYLPYRIDINIUM CHLORIDE 0.05%) solution 7 mL  7 mL Mouth Rinse q12n4p Theodis Blaze, MD   7 mL at 09/12/15 1639  . aspirin EC tablet 81  mg  81 mg Oral q morning - 10a Theodis Blaze, MD   81 mg at 09/13/15 1019  . calcium carbonate (TUMS - dosed in mg elemental calcium) chewable tablet 200 mg of elemental calcium  1 tablet Oral TID PRN Theodis Blaze, MD      . chlorhexidine (PERIDEX) 0.12 % solution 15 mL  15 mL Mouth Rinse BID Theodis Blaze, MD   15 mL at 09/10/15 2131  .  Chlorhexidine Gluconate Cloth 2 % PADS 6 each  6 each Topical Q0600 Theodis Blaze, MD   6 each at 09/11/15 [phone]  . diphenhydrAMINE (BENADRYL) injection 25 mg  25 mg Intravenous Q6H PRN Theodis Blaze, MD   25 mg at 09/12/15 2215  . diphenoxylate-atropine (LOMOTIL) 2.5-0.025 MG per tablet 1 tablet  1 tablet Oral TID PRN Theodis Blaze, MD      . enoxaparin (LOVENOX) injection 80 mg  80 mg Subcutaneous Q24H Emiliano Dyer, RPH   80 mg at 09/12/15 2215  . ertapenem (INVANZ) 1 g in sodium chloride 0.9 % 50 mL IVPB  1 g Intravenous Q24H Nishant Dhungel, MD   1 g at 09/13/15 1020  . ezetimibe (ZETIA) tablet 10 mg  10 mg Oral Daily Theodis Blaze, MD   10 mg at 09/13/15 1019  . fluconazole (DIFLUCAN) tablet 100 mg  100 mg Oral QHS Adrian Saran, RPH   100 mg at 09/12/15 2215  . furosemide (LASIX) tablet 40 mg  40 mg Oral Daily Theodis Blaze, MD   40 mg at 09/13/15 1019  . hydrOXYzine (ATARAX/VISTARIL) tablet 25 mg  25 mg Oral Q6H PRN Theodis Blaze, MD   25 mg at 09/12/15 1457  . insulin aspart (novoLOG) injection 0-20 Units  0-20 Units Subcutaneous TID WC Theodis Blaze, MD   3 Units at 09/13/15 [phone]  . insulin aspart (novoLOG) injection 7 Units  7 Units Subcutaneous TID WC Nishant Dhungel, MD   7 Units at 09/13/15 0835  . insulin NPH Human (HUMULIN N,NOVOLIN N) injection 28 Units  28 Units Subcutaneous BID AC & HS Nishant Dhungel, MD   28 Units at 09/13/15 0836  . isosorbide mononitrate (IMDUR) 24 hr tablet 60 mg  60 mg Oral Daily Theodis Blaze, MD   60 mg at 09/13/15 1031  . lisinopril (PRINIVIL,ZESTRIL) tablet 5 mg  5 mg Oral Daily Theodis Blaze, MD   5 mg at 09/13/15 1031  . loratadine (CLARITIN) tablet 10 mg  10 mg Oral Daily Theodis Blaze, MD   10 mg at 09/13/15 1019  . metoprolol tartrate (LOPRESSOR) tablet 50 mg  50 mg Oral BID Theodis Blaze, MD   50 mg at 09/13/15 1019  . nitroGLYCERIN (NITROSTAT) SL tablet 0.4 mg  0.4 mg Sublingual Q5 min PRN Theodis Blaze, MD      . oxybutynin (DITROPAN) tablet  5 mg  5 mg Oral Daily PRN Theodis Blaze, MD      . oxyCODONE-acetaminophen (PERCOCET/ROXICET) 5-325 MG per tablet 1 tablet  1 tablet Oral Q6H PRN Theodis Blaze, MD   1 tablet at 09/13/15 1039  . pantoprazole (PROTONIX) EC tablet 40 mg  40 mg Oral Daily Theodis Blaze, MD   40 mg at 09/13/15 1019  . rosuvastatin (CRESTOR) tablet 20 mg  20 mg Oral q1800 Theodis Blaze, MD   20 mg at 09/12/15 1737  . sodium chloride 0.9 % injection 10-40 mL  10-40 mL Intracatheter Q12H Nishant Dhungel, MD   10 mL at 09/13/15 1020  . sodium chloride 0.9 % injection 10-40 mL  10-40 mL Intracatheter PRN Louellen Molder, MD         Discharge Medications: Please see discharge summary for a list of discharge medications.  Relevant Imaging Results:  Relevant Lab Results:   Additional Information SSN: [id number]. Pt has PICC line for IV antibiotics at SNF, 9 more days of IV ertapenem to complete 2 weeks of antibiotic. (Stop date 09/22/2015)  Awa Bachicha, [place], LCSW

## 2015-09-13 NOTE — Progress Notes (Signed)
[facility] here to transport patient to [facility].  When attempting to transfer patient over to stretcher patient appeared to lose consciousness for a few seconds then came back to.  Attempted to obtain vitals with patient standing however he could not hold position.  Primary physician notified, orders received.

## 2015-09-13 NOTE — Clinical Social Work Placement (Signed)
   CLINICAL SOCIAL WORK PLACEMENT  NOTE  Date:  09/13/2015  Patient Details  Name: [name] MRN: [id number] Date of Birth: 1946/03/01  Clinical Social Work is seeking post-discharge placement for this patient at the [place] level of care (*[name] will initial, date and re-position this form in  chart as items are completed):  Yes   Patient/family provided with [place] Work Department's list of facilities offering this level of care within the geographic area requested by the patient (or if unable, by the patient's family).  Yes   Patient/family informed of their freedom to choose among providers that offer the needed level of care, that participate in Medicare, Medicaid or managed care program needed by the patient, have an available bed and are willing to accept the patient.  Yes   Patient/family informed of [facility]'s ownership interest in [facility] and [facility], as well as of the fact that they are under no obligation to receive care at these facilities.  PASRR submitted to EDS on       PASRR number received on       Existing PASRR number confirmed on 09/13/15     FL2 transmitted to all facilities in geographic area requested by pt/family on 09/13/15     FL2 transmitted to all facilities within larger geographic area on       Patient informed that his/her managed care company has contracts with or will negotiate with certain facilities, including the following:        Yes   Patient/family informed of bed offers received.  Patient chooses bed at [facility]     Physician recommends and patient chooses bed at      Patient to be transferred to [facility] on 09/13/15.  Patient to be transferred to facility by ambulance Corey Harold)     Patient family notified on 09/13/15 of transfer.  Name of family member notified:  pt notified at bedside     PHYSICIAN Please sign FL2     Additional Comment:     _______________________________________________ Ladell Pier, LCSW 09/13/2015, 12:21 PM

## 2015-09-13 NOTE — Progress Notes (Signed)
Patient had 3 episodes of syncopal episode where he would appear to have loss of consciousness for a few seconds and then come back right away. Patient is unaware of these episodes. All 3 episodes happen when patient was being moved from bed to chair while being transported to skilled nursing facility. Orthostasis was normal. EKG reviewed and unremarkable. Patient denies any chest pain, palpitations or dizziness. Reports he had similar symptoms in the past. I have canceled his discharge and will monitor him on telemetry overnight. I will check his potassium and magnesium.

## 2015-09-13 NOTE — Clinical Social Work Note (Signed)
Clinical Social Work Assessment  Patient Details  Name: [name] MRN: [id number] Date of Birth: 06-15-1946  Date of referral:  09/13/15               Reason for consult:  Discharge Planning                Permission sought to share information with:    Permission granted to share information::  No  Name::        Agency::     Relationship::     Contact Information:     Housing/Transportation Living arrangements for the past 2 months:  Single Family Home Source of Information:  Patient Patient Interpreter Needed:  None Criminal Activity/Legal Involvement Pertinent to Current Situation/Hospitalization:  No - Comment as needed Significant Relationships:  None Lives with:  Self Do you feel safe going back to the place where you live?  No Need for family participation in patient care:  No (Coment)  Care giving concerns:  Pt was planned to discharge home with home health services and IV antibiotics today, but pt changed his mind and wants to go to short term rehab and received IV antibiotics at SNF.    Social Worker assessment / plan:  [name] received phone call from RN stating that RN was about to arrange transport home, but pt stating that he now wishes to go to SNF. Pt reported to RN that pt has been to [facility] in the past and would be agreeable to returning and provided permission for [name] to send information to [facility] and contact facility to determine if facility can accept.  [name] completed FL2 and sent clinical information to [facility]. [name] spoke to [facility] who reviewed information and confirmed facility could accept pt today.  [name] spoke with pt at bedside and confirmed pt agreeable to discharge to [facility].   [name] to facilitate pt discharge needs this afternoon.  Employment status:  Retired Forensic scientist:  Information systems manager PT Recommendations:  [place], Home with [place],  [place] / Referral to community resources:  [place]  Patient/Family's Response to care:  Pt alert and oriented x 4. Pt recognized that he would not be able to care for himself at home and agreeable to discharge to [facility].  Patient/Family's Understanding of and Emotional Response to Diagnosis, Current Treatment, and Prognosis:  Pt expressed understanding that he would need to participate in therapy at [facility] and would received IV antibiotics at SNF.   Emotional Assessment Appearance:  Appears stated age Attitude/Demeanor/Rapport:  Other (pt appropriate) Affect (typically observed):  Appropriate Orientation:  Oriented to Self, Oriented to Place, Oriented to  Time, Oriented to Situation Alcohol / Substance use:  Not Applicable Psych involvement (Current and /or in the community):  No (Comment)  Discharge Needs  Concerns to be addressed:  Discharge Planning Concerns Readmission within the last 30 days:  No Current discharge risk:  None Barriers to Discharge:  No Barriers Identified   Celestine Prim, [place], LCSW 09/13/2015, 2:01 PM

## 2015-09-13 NOTE — Progress Notes (Signed)
[name] for discharge to home with home health services and per RN, pt needing ambulance transport to home.  [name] met with pt at bedside and confirmed address. [name] confirmed with pt that pt had keys to enter pt home.   [name] discussed with RN and provided RN with PT[facility] contact information [phone]) to RN to arrange [facility] when pt ready for transport.   Ambulance documents provided to RN.  No further social work needs identified at this time.  [name] signing off.   [name], MSW, [place] Work [phone]

## 2015-09-13 NOTE — Progress Notes (Signed)
Pt now for discharge to [facility] as pt felt that he would not be able to manage pt care at home.  [name] facilitated pt discharge needs including contacting facility, faxing pt discharge information via [place], discussing with pt at bedside, providing RN phone number to call report, and arranging ambulance transport via [facility].  Pt pleased that [facility] able to accept pt as pt is familiar with facility.   No further social work needs identified at this time.  [name] signing off.   [name], MSW, [place] Work [phone]

## 2015-09-14 ENCOUNTER — Other Ambulatory Visit: Payer: Self-pay

## 2015-09-14 DIAGNOSIS — N390 Urinary tract infection, site not specified: Secondary | ICD-10-CM

## 2015-09-14 DIAGNOSIS — E785 Hyperlipidemia, unspecified: Secondary | ICD-10-CM

## 2015-09-14 DIAGNOSIS — I472 Ventricular tachycardia: Secondary | ICD-10-CM

## 2015-09-14 DIAGNOSIS — I441 Atrioventricular block, second degree: Secondary | ICD-10-CM

## 2015-09-14 DIAGNOSIS — B9689 Other specified bacterial agents as the cause of diseases classified elsewhere: Secondary | ICD-10-CM

## 2015-09-14 DIAGNOSIS — I455 Other specified heart block: Secondary | ICD-10-CM | POA: Diagnosis not present

## 2015-09-14 LAB — CREATININE, SERUM: CREATININE: 0.58 mg/dL — AB (ref 0.61–1.24)

## 2015-09-14 LAB — GLUCOSE, CAPILLARY
GLUCOSE-CAPILLARY: 183 mg/dL — AB (ref 65–99)
GLUCOSE-CAPILLARY: 92 mg/dL (ref 65–99)
GLUCOSE-CAPILLARY: 195 mg/dL — AB (ref 65–99)
Glucose-Capillary: 108 mg/dL — ABNORMAL HIGH (ref 65–99)
Glucose-Capillary: 142 mg/dL — ABNORMAL HIGH (ref 65–99)

## 2015-09-14 MED ORDER — POLYETHYLENE GLYCOL 3350 17 G PO PACK
17.0000 g | PACK | Freq: Every day | ORAL | Status: DC | PRN
  Administered 2015-09-14: 17 g via ORAL
  Filled 2015-09-14: qty 1

## 2015-09-14 MED ORDER — DOCUSATE SODIUM 100 MG PO CAPS
200.0000 mg | ORAL_CAPSULE | Freq: Every day | ORAL | Status: DC
  Administered 2015-09-14: 200 mg via ORAL
  Filled 2015-09-14: qty 2

## 2015-09-14 MED ORDER — AMLODIPINE BESYLATE 5 MG PO TABS
5.0000 mg | ORAL_TABLET | Freq: Every day | ORAL | Status: DC
  Administered 2015-09-14 – 2015-09-15 (×2): 5 mg via ORAL
  Filled 2015-09-14 (×2): qty 1

## 2015-09-14 MED ORDER — MAGNESIUM SULFATE 2 GM/50ML IV SOLN
2.0000 g | Freq: Once | INTRAVENOUS | Status: AC
  Administered 2015-09-14: 2 g via INTRAVENOUS
  Filled 2015-09-14: qty 50

## 2015-09-14 NOTE — Consult Note (Signed)
CONSULTATION NOTE  Reason for Consult: Sinus pause  Requesting Physician: Dr. Sanjuana Letters  Cardiologist: Dr. Marlou Porch  HPI: This is a 70 y.o. male with a past medical history significant for obesity, hypertension, type 2 diabetes, diabetic neuropathy and neurogenic bladder, untreated sleep apnea and coronary artery disease. He underwent initial cardiac catheterization in 2013 by Dr. Luther Parody and felt to have severe diffuse non-revascularizable distal RCA disease with minimal disease of the left system. In August 2016 he had recurrent chest pain and a Myoview showed lateral ischemia with EF of 30%. A repeat heart catheterization showed a new occluded PLA system. Medical therapy has been recommended. During his hospitalization in October, he will experience some recurrent episodes of chest tightness and on telemetry was noted to have some mild pauses of 1.6 seconds for which she apparently was asymptomatic. He was now admitted for discharge from a urinary catheter site consistent with a cystitis/complicated UTI. He has improved since admission on 12/28 and was scheduled for discharge. On telemetry he was noted to have a couple of sinus pauses lasting between 2-3 seconds. It was unclear whether he was symptomatic with this. He was on Lopressor which has been discontinued. Cardiology was consult to regarding their input.  PMHx:  Past Medical History  Diagnosis Date  . Obese   . Hypertension   . Diabetic neuropathy, painful ([place])   . Chronic indwelling Foley catheter   . Bladder spasms   . Hyperlipidemia   . GERD (gastroesophageal reflux disease)   . Neurogenic bladder   . CAD (coronary artery disease)   . PONV (postoperative nausea and vomiting)   . CHF (congestive heart failure) ([place])   . DVT (deep venous thrombosis) (HCC) 1980's    LLE  . Type II diabetes mellitus ([place])   . Diabetic diarrhea ([place])   . Pneumonia 1999  . OSA (obstructive sleep apnea)     "they wanted me to wear a mask; I  couldn't" (11/10/2014)  . Arthritis     "hands, ankles, knees" (11/10/2014)  . Chronic lower back pain   . Stroke [facility]) 10/2011; 06/2014    right hand numbness/notes 10/20/2011, pt not sure he had stroke in 2013; "right hand weaker and right face not quite right since" (11/10/2014)   Past Surgical History  Procedure Laterality Date  . Gastroplasty    . Total knee arthroplasty Right 1990's  . Left heart catheterization with coronary angiogram N/A 10/24/2011    Procedure: LEFT HEART CATHETERIZATION WITH CORONARY ANGIOGRAM;  Surgeon: Candee Furbish, MD;  Location: [facility] CATH LAB;  Service: Cardiovascular;  Laterality: N/A;  right radial artery approach  . Cholecystectomy open  1970's?  . Joint replacement    . Carpal tunnel release Bilateral ~ 2003-2004  . Cardiac catheterization N/A 05/02/2015    Procedure: Left Heart Cath and Coronary Angiography;  Surgeon: Lorretta Harp, MD;  Location: [place] CV LAB;  Service: Cardiovascular;  Laterality: N/A;  . Cardiac catheterization N/A 05/04/2015    Procedure: Left Heart Cath and Coronary Angiography;  Surgeon: Wellington Hampshire, MD;  Location: [place] CV LAB;  Service: Cardiovascular;  Laterality: N/A;    FAMHx: Family History  Problem Relation Age of Onset  . Hypertension Mother   . Diabetes type I Father   . Cancer Brother      Testicular Cancer  . Cancer Brother     Leukemia    SOCHx:  reports that he quit smoking about 39 years ago. His smoking use included  Cigarettes. He has a 40 pack-year smoking history. He has never used smokeless tobacco. He reports that he does not drink alcohol or use illicit drugs.  ALLERGIES: Allergies  Allergen Reactions  . Ace Inhibitors Swelling    Pt tolerates lisinopril  . Lipitor [Atorvastatin Calcium] Swelling  . Metformin And Related Swelling  . Cefadroxil Hives  . Cephalexin Hives  . Morphine And Related Other (See Comments)    Sweating, feels like is "in rocky boat."  . Robaxin [Methocarbamol]  Other (See Comments)    Feels like he is shaky    ROS: A comprehensive review of systems was negative.  HOME MEDICATIONS:   Medication List    STOP taking these medications        insulin aspart 100 UNIT/ML injection  Commonly known as:  novoLOG      TAKE these medications        acetaminophen 325 MG tablet  Commonly known as:  TYLENOL  Take 2 tablets (650 mg total) by mouth every 4 (four) hours as needed for headache or mild pain.     aspirin EC 81 MG tablet  Take 81 mg by mouth every morning.     calcium carbonate 500 MG chewable tablet  Commonly known as:  TUMS - dosed in mg elemental calcium  Chew 1 tablet (200 mg of elemental calcium total) by mouth 3 (three) times daily as needed for indigestion or heartburn.     Chlorhexidine Gluconate Cloth 2 % Pads  Apply 6 each topically daily at 6 (six) AM.     diphenoxylate-atropine 2.5-0.025 MG tablet  Commonly known as:  LOMOTIL  Take 1 tablet by mouth 3 (three) times daily as needed for diarrhea or loose stools.     ertapenem 1 g in sodium chloride 0.9 % 50 mL  Inject 1 g into the vein daily.     ezetimibe 10 MG tablet  Commonly known as:  ZETIA  Take 1 tablet (10 mg total) by mouth daily.     fexofenadine 180 MG tablet  Commonly known as:  ALLEGRA  Take 180 mg by mouth daily as needed for allergies or rhinitis.     fluconazole 100 MG tablet  Commonly known as:  DIFLUCAN  Take 1 tablet (100 mg total) by mouth at bedtime.     furosemide 40 MG tablet  Commonly known as:  LASIX  Take 1 tablet (40 mg total) by mouth daily.     insulin NPH Human 100 UNIT/ML injection  Commonly known as:  HUMULIN N  Inject 0.28 mLs (28 Units total) into the skin 2 (two) times daily at 8 am and 10 pm.     isosorbide mononitrate 60 MG 24 hr tablet  Commonly known as:  IMDUR  Take 1 tablet (60 mg total) by mouth daily.     lisinopril 5 MG tablet  Commonly known as:  PRINIVIL,ZESTRIL  Take 5 mg by mouth daily.     metoprolol 50  MG tablet  Commonly known as:  LOPRESSOR  Take 50 mg by mouth 2 (two) times daily.     nitroGLYCERIN 0.4 MG SL tablet  Commonly known as:  NITROSTAT  Place 1 tablet (0.4 mg total) under the tongue every 5 (five) minutes as needed for chest pain.     omeprazole 20 MG capsule  Commonly known as:  PRILOSEC  Take 20 mg by mouth daily.     oxybutynin 5 MG tablet  Commonly known as:  DITROPAN  Take  5 mg by mouth daily as needed for bladder spasms.     rosuvastatin 20 MG tablet  Commonly known as:  CRESTOR  Take 1 tablet (20 mg total) by mouth daily at 6 PM.     senna-docusate 8.6-50 MG tablet  Commonly known as:  Senokot-S  Take 2 tablets by mouth daily.        HOSPITAL MEDICATIONS: Prior to Admission:  Prescriptions prior to admission  Medication Sig Dispense Refill Last Dose  . acetaminophen (TYLENOL) 325 MG tablet Take 2 tablets (650 mg total) by mouth every 4 (four) hours as needed for headache or mild pain.   unknown  . aspirin EC 81 MG tablet Take 81 mg by mouth every morning.    09/06/2015 at Unknown time  . calcium carbonate (TUMS - DOSED IN MG ELEMENTAL CALCIUM) 500 MG chewable tablet Chew 1 tablet (200 mg of elemental calcium total) by mouth 3 (three) times daily as needed for indigestion or heartburn.   unknown  . diphenoxylate-atropine (LOMOTIL) 2.5-0.025 MG tablet Take 1 tablet by mouth 3 (three) times daily as needed for diarrhea or loose stools.   2 unknown  . ezetimibe (ZETIA) 10 MG tablet Take 1 tablet (10 mg total) by mouth daily. 30 tablet 11 09/06/2015 at Unknown time  . fexofenadine (ALLEGRA) 180 MG tablet Take 180 mg by mouth daily as needed for allergies or rhinitis.   unknown  . furosemide (LASIX) 40 MG tablet Take 1 tablet (40 mg total) by mouth daily. 30 tablet 11 09/06/2015 at Unknown time  . isosorbide mononitrate (IMDUR) 60 MG 24 hr tablet Take 1 tablet (60 mg total) by mouth daily.   09/06/2015 at Unknown time  . lisinopril (PRINIVIL,ZESTRIL) 5 MG tablet  Take 5 mg by mouth daily.  3 09/06/2015 at Unknown time  . metoprolol (LOPRESSOR) 50 MG tablet Take 50 mg by mouth 2 (two) times daily.   09/06/2015 at 0800  . nitroGLYCERIN (NITROSTAT) 0.4 MG SL tablet Place 1 tablet (0.4 mg total) under the tongue every 5 (five) minutes as needed for chest pain. 25 tablet 2 unknown  . omeprazole (PRILOSEC) 20 MG capsule Take 20 mg by mouth daily.   09/06/2015 at Unknown time  . oxybutynin (DITROPAN) 5 MG tablet Take 5 mg by mouth daily as needed for bladder spasms.   unknown  . rosuvastatin (CRESTOR) 20 MG tablet Take 1 tablet (20 mg total) by mouth daily at 6 PM.   09/06/2015 at Unknown time  . senna-docusate (SENOKOT-S) 8.6-50 MG per tablet Take 2 tablets by mouth daily. (Patient taking differently: Take 2 tablets by mouth 2 (two) times daily as needed for moderate constipation. ) 28 tablet 0 unknown  . [DISCONTINUED] insulin NPH Human (HUMULIN N) 100 UNIT/ML injection Inject 0.2 mLs (20 Units total) into the skin 2 (two) times daily at 8 am and 10 pm. 10 mL 11 09/06/2015 at Unknown time  . insulin aspart (NOVOLOG) 100 UNIT/ML injection Inject 0-9 Units into the skin 3 (three) times daily with meals. (Patient not taking: Reported on 09/07/2015) 10 mL 11 Not Taking at Unknown time  . metoprolol tartrate (LOPRESSOR) 25 MG tablet Take 1 tablet (25 mg total) by mouth 2 (two) times daily. (Patient not taking: Reported on 09/07/2015)   Not Taking at 0800    VITALS: Blood pressure 116/59, pulse 61, temperature 97.7 F (36.5 C), temperature source Oral, resp. rate 20, height 6' 1"  (1.854 m), weight 354 lb 12.3 oz (160.921 kg), SpO2 98 %.  PHYSICAL EXAM: General appearance: alert, no distress and morbidly obese Neck: no carotid bruit and no JVD Lungs: clear to auscultation bilaterally Heart: regular rate and rhythm and occasional missed beats Abdomen: soft, non-tender; bowel sounds normal; no masses,  no organomegaly Extremities: extremities normal, atraumatic, no  cyanosis or edema Pulses: 2+ and symmetric Skin: Skin color, texture, turgor normal. No rashes or lesions Neurologic: Grossly normal PSych: Pleasant  LABS: Results for orders placed or performed during the hospital encounter of 09/07/15 (from the past 48 hour(s))  Glucose, capillary     Status: Abnormal   Collection Time: 09/12/15  8:57 PM  Result Value Ref Range   Glucose-Capillary 182 (H) 65 - 99 mg/dL   Comment 1 Notify RN   Glucose, capillary     Status: Abnormal   Collection Time: 09/13/15  7:43 AM  Result Value Ref Range   Glucose-Capillary 142 (H) 65 - 99 mg/dL   Comment 1 Notify RN   Glucose, capillary     Status: Abnormal   Collection Time: 09/13/15 12:19 PM  Result Value Ref Range   Glucose-Capillary 147 (H) 65 - 99 mg/dL   Comment 1 Notify RN   Glucose, capillary     Status: Abnormal   Collection Time: 09/13/15  4:42 PM  Result Value Ref Range   Glucose-Capillary 142 (H) 65 - 99 mg/dL   Comment 1 Notify RN   Basic metabolic panel     Status: Abnormal   Collection Time: 09/13/15  5:30 PM  Result Value Ref Range   Sodium 138 135 - 145 mmol/L   Potassium 4.4 3.5 - 5.1 mmol/L   Chloride 103 101 - 111 mmol/L   CO2 28 22 - 32 mmol/L   Glucose, Bld 165 (H) 65 - 99 mg/dL   BUN 18 6 - 20 mg/dL   Creatinine, Ser 0.88 0.61 - 1.24 mg/dL   Calcium 8.7 (L) 8.9 - 10.3 mg/dL   GFR calc non Af Amer >60 >60 mL/min   GFR calc Af Amer >60 >60 mL/min    Comment: (NOTE) The eGFR has been calculated using the CKD EPI equation. This calculation has not been validated in all clinical situations. eGFR's persistently <60 mL/min signify possible Chronic Kidney Disease.    Anion gap 7 5 - 15  Magnesium     Status: Abnormal   Collection Time: 09/13/15  5:30 PM  Result Value Ref Range   Magnesium 1.6 (L) 1.7 - 2.4 mg/dL  Glucose, capillary     Status: Abnormal   Collection Time: 09/13/15  8:29 PM  Result Value Ref Range   Glucose-Capillary 183 (H) 65 - 99 mg/dL  Creatinine, serum      Status: Abnormal   Collection Time: 09/14/15  4:36 AM  Result Value Ref Range   Creatinine, Ser 0.58 (L) 0.61 - 1.24 mg/dL   GFR calc non Af Amer >60 >60 mL/min   GFR calc Af Amer >60 >60 mL/min    Comment: (NOTE) The eGFR has been calculated using the CKD EPI equation. This calculation has not been validated in all clinical situations. eGFR's persistently <60 mL/min signify possible Chronic Kidney Disease.   Glucose, capillary     Status: None   Collection Time: 09/14/15  7:24 AM  Result Value Ref Range   Glucose-Capillary 92 65 - 99 mg/dL  Glucose, capillary     Status: Abnormal   Collection Time: 09/14/15 11:51 AM  Result Value Ref Range   Glucose-Capillary 108 (H) 65 - 99  mg/dL    IMAGING: No results found.  HOSPITAL DIAGNOSES: Principal Problem:   Urinary tract infection due to extended-spectrum beta lactamase (ESBL)-producing Klebsiella Active Problems:   Diabetic neuropathy (HCC)   Chronic indwelling Foley catheter   Chronic diastolic CHF (congestive heart failure) (HCC)   Diabetes type 2, uncontrolled (HCC)   UTI (urinary tract infection)   Neurogenic bladder   Hyperlipidemia LDL goal <70   Type II diabetes mellitus with peripheral circulatory disorder, uncontrolled (HCC)   Morbid obesity (HCC)   Candidiasis, intertriginous   Sinus pause   Hypomagnesemia  TELEMETRY: Predominant sinus rhythm with PVCs, one short 4 beat run of NSVT, 2 short sinus pauses and 1. Of second-degree AV block with 4 on answered P waves   IMPRESSION: 1. Sinus pauses (seen during a previous admission), short period of second-degree AV block 2. PVCs/NSVT  RECOMMENDATION: 1. Agree with holding Lopressor for now. The only concern is that he may have more ventricular ectopy or nonsustained VT. Continue monitoring overnight. I agree with repleting magnesium to greater than 2 and potassium greater than 4. He describes some mild chest discomfort which is chronic and I do not think is  related to his pauses or ventricular ectopy. He may need to start on low-dose beta blocker at discharge as a compromise.  Thanks for the consultation. We'll follow-up with him tomorrow.  Time Spent Directly with Patient: 30 minutes  Pixie Casino, MD, [facility] Attending Cardiologist [place] 09/14/2015, 4:35 PM

## 2015-09-14 NOTE — Progress Notes (Signed)
[facility] PROGRESS NOTE  JACOLBI SCOLA [id number] DOB: Jan 12, 1946 DOA: 09/07/2015 PCP: Irven Shelling, MD  Summary ,70 year old morbidly obese male with type 2 diabetes mellitus, hypertension, chronic indwelling Foley catheter due to neurogenic bladder with recurrent UTIs, essential hypertension presented to ED after noticing pus in his catheter for several days. He also had productive cough of yellow sputum. His Urine culture was positive for klebsiella ESBL UTI. He was started on empiric cefepime , then switched to ertapenem. Patient was ready for discharge to skilled nursing facility yesterday when he was noted to have syncopal-like events and to have sinus pauses lasting 3 seconds. He had been on Lopressor which was discontinued this morning. He is also hypomagnesemic. I have consulted cardiology for further input. Plan Urinary tract infection due to extended-spectrum beta lactamase (ESBL)-producing Klebsiella probably secondary to indwelling Foley catheter/UTI (urinary tract infection)/Neurogenic bladder/ Chronic indwelling Foley catheter/Candidiasis, intertriginous  Patient on antibiotics to complete course per Dr Clementeen Graham. Sinus pause/Chronic diastolic CHF (congestive heart failure) (HCC)/Hypomagnesemia  Sinus pause apparently asymptomatic. Patient on Lopressor till this morning.  Discontinue Lopressor, start Norvasc for blood pressure control  Replenish magnesium  Consult cardiology Diabetic neuropathy (HCC)/Diabetes type 2, uncontrolled (HCC)/Hyperlipidemia LDL goal <70/Type II diabetes mellitus with peripheral circulatory disorder, uncontrolled (HCC)/Morbid obesity ([place])  No acute changes  Code Status: Full Code Family Communication:  Disposition Plan: For SNF   Consultants:  Cardiology  Procedure  None  Antibiotics:  Invanz  Fluconazole  HPI/Subjective: Denies specific complaints.  Objective: Filed Vitals:   09/14/15 0342 09/14/15 1028   BP: 130/76 135/70  Pulse: 67 65  Temp: 97.5 F (36.4 C)   Resp: 20     Intake/Output Summary (Last 24 hours) at 09/14/15 1427 Last data filed at 09/14/15 0503  Gross per 24 hour  Intake    100 ml  Output   1200 ml  Net  -1100 ml   Filed Weights   09/08/15 1400 09/13/15 0526 09/14/15 0851  Weight: 158.759 kg (350 lb) 159.938 kg (352 lb 9.6 oz) 160.921 kg (354 lb 12.3 oz)    Exam:   General:  Comfortable at rest.  Cardiovascular: S1-S2 normal. No murmurs. Pulse regular.  Respiratory: Good air entry bilaterally. No rhonchi or rales.  Abdomen: Soft and nontender. Normal bowel sounds. No organomegaly.  Musculoskeletal: No pedal edema. Foley catheter in place.  Neurological: Intact  Data Reviewed: Basic Metabolic Panel:  Recent Labs Lab 09/08/15 0438 09/09/15 0500 09/10/15 0441 09/13/15 1730 09/14/15 0436  NA 132* 137 135 138  --   K 3.8 3.8 3.8 4.4  --   CL 98* 101 99* 103  --   CO2 27 29 28 28   --   GLUCOSE 335* 197* 255* 165*  --   BUN 12 13 13 18   --   CREATININE 0.65 0.72 0.67 0.88 0.58*  CALCIUM 7.8* 8.7* 8.6* 8.7*  --   MG  --   --   --  1.6*  --    Liver Function Tests: No results for input(s): AST, ALT, ALKPHOS, BILITOT, PROT, ALBUMIN in the last 168 hours. No results for input(s): LIPASE, AMYLASE in the last 168 hours. No results for input(s): AMMONIA in the last 168 hours. CBC:  Recent Labs Lab 09/07/15 1700 09/08/15 0438 09/09/15 0500 09/10/15 0441  WBC 10.3 9.1 8.7 7.5  HGB 12.9* 11.7* 12.4* 12.2*  HCT 38.0* 35.0* 36.8* 36.6*  MCV 90.9 89.7 91.1 89.3  PLT 208 220 240 229   Cardiac Enzymes:  Recent Labs Lab 09/07/15 1700 09/07/15 2145 09/08/15 0438  TROPONINI <0.03 <0.03 <0.03   BNP (last 3 results)  Recent Labs  12/22/14 2330 07/02/15 1439  BNP 120.1* 320.3*    ProBNP (last 3 results) No results for input(s): PROBNP in the last 8760 hours.  CBG:  Recent Labs Lab 09/13/15 1219 09/13/15 1642 09/13/15 2029  09/14/15 0724 09/14/15 1151  GLUCAP 147* 142* 183* 92 108*    Recent Results (from the past 240 hour(s))  Urine culture     Status: None   Collection Time: 09/07/15 12:33 PM  Result Value Ref Range Status   Specimen Description URINE, CATHETERIZED  Final   Special Requests Immunocompromised  Final   Culture   Final    >=100,000 COLONIES/mL KLEBSIELLA PNEUMONIAE Confirmed Extended Spectrum Beta-Lactamase Producer (ESBL) Performed at [facility]    Report Status 09/11/2015 FINAL  Final   Organism ID, Bacteria KLEBSIELLA PNEUMONIAE  Final      Susceptibility   Klebsiella pneumoniae - MIC*    AMPICILLIN >=32 RESISTANT Resistant     CEFAZOLIN >=64 RESISTANT Resistant     CEFTRIAXONE >=64 RESISTANT Resistant     CIPROFLOXACIN >=4 RESISTANT Resistant     GENTAMICIN >=16 RESISTANT Resistant     IMIPENEM <=0.25 SENSITIVE Sensitive     NITROFURANTOIN 128 RESISTANT Resistant     TRIMETH/SULFA >=320 RESISTANT Resistant     AMPICILLIN/SULBACTAM >=32 RESISTANT Resistant     PIP/TAZO 16 SENSITIVE Sensitive     * >=100,000 COLONIES/mL KLEBSIELLA PNEUMONIAE  Urine culture     Status: None   Collection Time: 09/07/15 12:33 PM  Result Value Ref Range Status   Specimen Description URINE, CATHETERIZED  Final   Special Requests NONE  Final   Culture   Final    >=100,000 COLONIES/mL KLEBSIELLA PNEUMONIAE SUSCEPTIBILITIES PERFORMED ON PREVIOUS CULTURE WITHIN THE LAST 5 DAYS. Performed at [facility]    Report Status 09/11/2015 FINAL  Final  MRSA PCR Screening     Status: Abnormal   Collection Time: 09/08/15 10:06 AM  Result Value Ref Range Status   MRSA by PCR POSITIVE (A) NEGATIVE Final    Comment:        The GeneXpert MRSA Assay (FDA approved for NASAL specimens only), is one component of a comprehensive MRSA colonization surveillance program. It is not intended to diagnose MRSA infection nor to guide or monitor treatment for MRSA infections. RESULT CALLED TO, READ  BACK BY AND VERIFIED WITH: THIGPEN,J @1359  ON [id number] BY POTEAT,S      Studies: No results found.  Scheduled Meds: . antiseptic oral rinse  7 mL Mouth Rinse q12n4p  . aspirin EC  81 mg Oral q morning - 10a  . chlorhexidine  15 mL Mouth Rinse BID  . enoxaparin (LOVENOX) injection  80 mg Subcutaneous Q24H  . ertapenem  1 g Intravenous Q24H  . ezetimibe  10 mg Oral Daily  . fluconazole  100 mg Oral QHS  . furosemide  40 mg Oral Daily  . insulin aspart  0-20 Units Subcutaneous TID WC  . insulin aspart  7 Units Subcutaneous TID WC  . insulin NPH Human  28 Units Subcutaneous BID AC & HS  . isosorbide mononitrate  60 mg Oral Daily  . lisinopril  5 mg Oral Daily  . loratadine  10 mg Oral Daily  . magnesium sulfate 1 - 4 g bolus IVPB  2 g Intravenous Once  . pantoprazole  40 mg Oral Daily  .  rosuvastatin  20 mg Oral q1800  . sodium chloride  10-40 mL Intracatheter Q12H   Continuous Infusions:    Time spent: 25 minutes    Johnryan Sao  [facility] Pager [phone]. If 7PM-7AM, please contact night-coverage at [email or web address], password [facility] 09/14/2015, 2:27 PM  LOS: 6 days

## 2015-09-14 NOTE — Progress Notes (Signed)
On call provider notified as pt in was having some sinus pauses of 1.4 seconds and intermittent second degree av block. Stat EKG ordered and performed.

## 2015-09-15 DIAGNOSIS — I2510 Atherosclerotic heart disease of native coronary artery without angina pectoris: Secondary | ICD-10-CM

## 2015-09-15 DIAGNOSIS — I5032 Chronic diastolic (congestive) heart failure: Secondary | ICD-10-CM

## 2015-09-15 DIAGNOSIS — I493 Ventricular premature depolarization: Secondary | ICD-10-CM

## 2015-09-15 HISTORY — DX: Atherosclerotic heart disease of native coronary artery without angina pectoris: I25.10

## 2015-09-15 LAB — BASIC METABOLIC PANEL
Anion gap: 7 (ref 5–15)
BUN: 17 mg/dL (ref 6–20)
CHLORIDE: 100 mmol/L — AB (ref 101–111)
CO2: 30 mmol/L (ref 22–32)
CREATININE: 0.72 mg/dL (ref 0.61–1.24)
Calcium: 8.4 mg/dL — ABNORMAL LOW (ref 8.9–10.3)
GFR calc Af Amer: >60 mL/min (ref >60)
GFR calc non Af Amer: >60 mL/min (ref >60)
GLUCOSE: 125 mg/dL — AB (ref 65–99)
Potassium: 4.2 mmol/L (ref 3.5–5.1)
Sodium: 137 mmol/L (ref 135–145)

## 2015-09-15 LAB — CBC
HEMATOCRIT: 37.6 % — AB (ref 39.0–52.0)
HEMOGLOBIN: 12.4 g/dL — AB (ref 13.0–17.0)
MCH: 30.1 pg (ref 26.0–34.0)
MCHC: 33 g/dL (ref 30.0–36.0)
MCV: 91.3 fL (ref 78.0–100.0)
Platelets: 227 10*3/uL (ref 150–400)
RBC: 4.12 MIL/uL — ABNORMAL LOW (ref 4.22–5.81)
RDW: 13.3 % (ref 11.5–15.5)
WBC: 9 10*3/uL (ref 4.0–10.5)

## 2015-09-15 LAB — MAGNESIUM: Magnesium: 1.8 mg/dL (ref 1.7–2.4)

## 2015-09-15 LAB — TSH: TSH: 2.954 u[IU]/mL (ref 0.350–4.500)

## 2015-09-15 LAB — GLUCOSE, CAPILLARY
GLUCOSE-CAPILLARY: 102 mg/dL — AB (ref 65–99)
GLUCOSE-CAPILLARY: 116 mg/dL — AB (ref 65–99)
GLUCOSE-CAPILLARY: 184 mg/dL — AB (ref 65–99)

## 2015-09-15 LAB — PHOSPHORUS: PHOSPHORUS: 3.5 mg/dL (ref 2.5–4.6)

## 2015-09-15 MED ORDER — FLUCONAZOLE 100 MG PO TABS: 100.0000 mg | ORAL_TABLET | Freq: Every day | ORAL | Status: AC

## 2015-09-15 MED ORDER — MAGNESIUM OXIDE 400 (241.3 MG) MG PO TABS: 400.0000 mg | ORAL_TABLET | Freq: Every day | ORAL | Status: DC

## 2015-09-15 MED ORDER — HEPARIN SOD (PORK) LOCK FLUSH 100 UNIT/ML IV SOLN
250.0000 [IU] | INTRAVENOUS | Status: AC | PRN
  Administered 2015-09-15: 250 [IU]

## 2015-09-15 MED ORDER — METOPROLOL TARTRATE 25 MG PO TABS: 12.5000 mg | ORAL_TABLET | Freq: Two times a day (BID) | ORAL | Status: DC

## 2015-09-15 MED ORDER — AMLODIPINE BESYLATE 5 MG PO TABS: 5.0000 mg | ORAL_TABLET | Freq: Every day | ORAL | Status: DC

## 2015-09-15 MED ORDER — MAGNESIUM OXIDE 400 (241.3 MG) MG PO TABS
400.0000 mg | ORAL_TABLET | Freq: Every day | ORAL | Status: DC
  Administered 2015-09-15: 400 mg via ORAL
  Filled 2015-09-15: qty 1

## 2015-09-15 MED ORDER — METOPROLOL TARTRATE 25 MG PO TABS
12.5000 mg | ORAL_TABLET | Freq: Two times a day (BID) | ORAL | Status: DC
Start: 2015-09-15 — End: 2015-09-22

## 2015-09-15 NOTE — Progress Notes (Addendum)
Report given to nurse at [facility]. Patient is stable at discharge. Awaiting transport to facility.

## 2015-09-15 NOTE — Clinical Social Work Placement (Signed)
Patient is set to discharge to [facility] today. Patient aware. Discharge packet given to RN, Ene. [facility] called for transport to pickup at 3:00pm.     [name], [place] Social Worker cell #: [phone]    CLINICAL SOCIAL WORK PLACEMENT  NOTE  Date:  09/15/2015  Patient Details  Name: [name] MRN: [id number] Date of Birth: [birth year]  Clinical Social Work is seeking post-discharge placement for this patient at the [place] level of care (*[name] will initial, date and re-position this form in  chart as items are completed):  Yes   Patient/family provided with [place] Work Department's list of facilities offering this level of care within the geographic area requested by the patient (or if unable, by the patient's family).  Yes   Patient/family informed of their freedom to choose among providers that offer the needed level of care, that participate in Medicare, Medicaid or managed care program needed by the patient, have an available bed and are willing to accept the patient.  Yes   Patient/family informed of [place]'s ownership interest in [facility] and [facility], as well as of the fact that they are under no obligation to receive care at these facilities.  PASRR submitted to EDS on       PASRR number received on       Existing PASRR number confirmed on 09/13/15     FL2 transmitted to all facilities in geographic area requested by pt/family on 09/13/15     FL2 transmitted to all facilities within larger geographic area on       Patient informed that his/her managed care company has contracts with or will negotiate with certain facilities, including the following:        Yes   Patient/family informed of bed offers received.  Patient chooses bed at [facility]     Physician recommends and patient chooses bed at      Patient to be transferred to [facility] on 09/15/15.  Patient to be transferred to facility by ambulance [facility]y [facility])     Patient family notified on 09/15/15 of transfer.  Name of family member notified:  pt notified at bedside     PHYSICIAN Please sign FL2     Additional Comment:    _______________________________________________ Standley Brooking, LCSW 09/15/2015, 1:58 PM

## 2015-09-15 NOTE — Progress Notes (Signed)
Patient: [name] / Admit Date: 09/07/2015 / Date of Encounter: 09/15/2015, 8:11 AM   Subjective: Patient says he just feels "off" but can't describe this further - but overall feeling OK. No CP or SOB.   Objective: Telemetry: unremarkable 1/4 PM - 1/5 - no further ectopy, NSVT, sinus pauses, or significant bradycardia. Only HR upper 40s during nocturnal hours which is chronic for patient. HR awake 60s-70s Physical Exam: Blood pressure 111/65, pulse 64, temperature 97.2 F (36.2 C), temperature source Oral, resp. rate 20, height 6\' 1"  (1.854 m), weight 354 lb 4.8 oz (160.709 kg), SpO2 98 %. General: Well developed morbidly obese WM in no acute distress. Head: Normocephalic, atraumatic, sclera non-icteric, no xanthomas, nares are without discharge. Neck: JVP not elevated. Lungs: Clear bilaterally to auscultation without wheezes, rales, or rhonchi. Breathing is unlabored. Heart: RRR S1 S2 without murmurs, rubs, or gallops.  Abdomen: Soft, non-tender, non-distended with normoactive bowel sounds. No rebound/guarding. Extremities: No clubbing or cyanosis. Chronic venous stasis changes. Distal pedal pulses are 2+ and equal bilaterally. Neuro: Alert and oriented X 3. Moves all extremities spontaneously. Psych:  Responds to questions appropriately with a normal affect.   Intake/Output Summary (Last 24 hours) at 09/15/15 0811 Last data filed at 09/15/15 0700  Gross per 24 hour  Intake   1200 ml  Output   3600 ml  Net  -2400 ml    Inpatient Medications:  . amLODipine  5 mg Oral Daily  . antiseptic oral rinse  7 mL Mouth Rinse q12n4p  . aspirin EC  81 mg Oral q morning - 10a  . chlorhexidine  15 mL Mouth Rinse BID  . docusate sodium  200 mg Oral QHS  . enoxaparin (LOVENOX) injection  80 mg Subcutaneous Q24H  . ertapenem  1 g Intravenous Q24H  . ezetimibe  10 mg Oral Daily  . fluconazole  100 mg Oral QHS  . furosemide  40 mg Oral Daily  . insulin aspart  0-20 Units Subcutaneous  TID WC  . insulin aspart  7 Units Subcutaneous TID WC  . insulin NPH Human  28 Units Subcutaneous BID AC & HS  . isosorbide mononitrate  60 mg Oral Daily  . lisinopril  5 mg Oral Daily  . loratadine  10 mg Oral Daily  . pantoprazole  40 mg Oral Daily  . rosuvastatin  20 mg Oral q1800  . sodium chloride  10-40 mL Intracatheter Q12H   Infusions:    Labs:  Recent Labs  09/13/15 1730 09/14/15 0436 09/15/15 0413  NA 138  --  137  K 4.4  --  4.2  CL 103  --  100*  CO2 28  --  30  GLUCOSE 165*  --  125*  BUN 18  --  17  CREATININE 0.88 0.58* 0.72  CALCIUM 8.7*  --  8.4*  MG 1.6*  --  1.8  PHOS  --   --  3.5   No results for input(s): AST, ALT, ALKPHOS, BILITOT, PROT, ALBUMIN in the last 72 hours.  Recent Labs  09/15/15 0413  WBC 9.0  HGB 12.4*  HCT 37.6*  MCV 91.3  PLT 227    Radiology/Studies:  Ct Chest Wo Contrast  09/07/2015  CLINICAL DATA:  Inpatient with dyspnea and productive cough. History of CHF. EXAM: CT CHEST WITHOUT CONTRAST TECHNIQUE: Multidetector CT imaging of the chest was performed following the standard protocol without IV contrast. COMPARISON:  09/14/2014 chest CT angiogram. FINDINGS: Mediastinum/Nodes: Normal heart size. No  pericardial fluid/thickening. Coronary atherosclerosis. Great vessels are normal in course and caliber. Normal visualized thyroid. Normal esophagus. No pathologically enlarged axillary, mediastinal or gross right hilar lymph nodes, noting limited sensitivity for the detection of hilar adenopathy on this noncontrast study. There is questionable fullness in the left suprahilar region. Lungs/Pleura: No pneumothorax. No pleural effusion. Left upper lobe 3 mm solid pulmonary nodule (series 5/ image 9), stable since 09/14/2014. No acute consolidative airspace disease, additional significant pulmonary nodules or lung masses. Upper abdomen: Partially visualized are postsurgical changes in the proximal stomach, with limited visualization due to  streak artifact from surgical clips. Musculoskeletal: No aggressive appearing focal osseous lesions. Moderate degenerative changes in the thoracic spine. Stable symmetric moderate gynecomastia. IMPRESSION: 1. Questionable fullness in the left suprahilar region, cannot exclude left hilar adenopathy. Recommend further characterization with chest CT with IV contrast. 2. Left upper lobe 3 mm solid pulmonary nodule, for which 11 month stability has been demonstrated. If the patient is at high risk for bronchogenic carcinoma, follow-up chest CT at 1 year is recommended. If the patient is at low risk, no follow-up is needed. This recommendation follows the consensus statement: Guidelines for Management of Small Pulmonary Nodules Detected on CT Scans: A Statement from the [employer] as published in Radiology 2005; 237:395-400. 3. No acute consolidative airspace disease. 4. Coronary atherosclerosis. Electronically Signed   By: Ilona Sorrel M.D.   On: 09/07/2015 22:08     Assessment and Plan  11M with morbid obesity, untreated OSA, HTN, DM c/b neuropathy, neurogenic bladder, CVA, DVT, CAD (severe diffuse non-revascularizable distal RCA disease in 2013, occluded PLA system by cath 04/2015 c/b RCA dissection which reportedly improved with relook cath), nocturnal bradycardia in the setting of OSA, chronic diastolic CHF. He presented to [facility] for discharge from urinary catheter site c/w complicated UTI. He was scheduled for discharge but was noted on telemetry to have sinus pauses with brief run of second degree AV block, PVCs, one 4 beat run of NSVT.  1. Sinus pauses/nocturnal bradycardia - pauses have resolved off BB. Will review with MD whether he wants to restart metoprolol at lower dose (i.e. 12.5mg  BID - was on 50mg  BID at home). Nocturnal bradycardia has been seen on prior admissions and is likely due to his untreated OSA. Add TSH onto labs.  2. NSVT/PVCs - resolved s/p magnesium supplementation. Will add  MagOx 400mg  daily.  3. CAD - continue current regimen.  4. Hilar fullness/pulmonary nodule  on CT - further imaging per IM.  Of note, patient was not seen in the office in 2016 - he no-showed/cancelled these appts and was only ever seen in the hospital. Would be beneficial to ensure f/u for him at discharge. Please let us know when patient is going home so we can arrange.  Signed, Melina Copa PA-C Pager: [phone]

## 2015-09-19 ENCOUNTER — Encounter: Payer: Self-pay | Admitting: *Deleted

## 2015-09-19 DIAGNOSIS — G4733 Obstructive sleep apnea (adult) (pediatric): Secondary | ICD-10-CM | POA: Diagnosis not present

## 2015-09-19 DIAGNOSIS — I10 Essential (primary) hypertension: Secondary | ICD-10-CM | POA: Diagnosis not present

## 2015-09-19 DIAGNOSIS — M1990 Unspecified osteoarthritis, unspecified site: Secondary | ICD-10-CM | POA: Diagnosis not present

## 2015-09-19 DIAGNOSIS — E1165 Type 2 diabetes mellitus with hyperglycemia: Secondary | ICD-10-CM | POA: Diagnosis not present

## 2015-09-19 DIAGNOSIS — I5032 Chronic diastolic (congestive) heart failure: Secondary | ICD-10-CM | POA: Diagnosis not present

## 2015-09-19 DIAGNOSIS — I639 Cerebral infarction, unspecified: Secondary | ICD-10-CM | POA: Diagnosis not present

## 2015-09-21 DIAGNOSIS — Z466 Encounter for fitting and adjustment of urinary device: Secondary | ICD-10-CM | POA: Diagnosis not present

## 2015-09-21 DIAGNOSIS — G4730 Sleep apnea, unspecified: Secondary | ICD-10-CM | POA: Diagnosis not present

## 2015-09-21 DIAGNOSIS — Z86718 Personal history of other venous thrombosis and embolism: Secondary | ICD-10-CM | POA: Diagnosis not present

## 2015-09-21 DIAGNOSIS — F0390 Unspecified dementia without behavioral disturbance: Secondary | ICD-10-CM | POA: Diagnosis not present

## 2015-09-21 DIAGNOSIS — N319 Neuromuscular dysfunction of bladder, unspecified: Secondary | ICD-10-CM | POA: Diagnosis not present

## 2015-09-21 DIAGNOSIS — Z7982 Long term (current) use of aspirin: Secondary | ICD-10-CM | POA: Diagnosis not present

## 2015-09-21 DIAGNOSIS — E1151 Type 2 diabetes mellitus with diabetic peripheral angiopathy without gangrene: Secondary | ICD-10-CM | POA: Diagnosis not present

## 2015-09-21 DIAGNOSIS — E1140 Type 2 diabetes mellitus with diabetic neuropathy, unspecified: Secondary | ICD-10-CM | POA: Diagnosis not present

## 2015-09-21 DIAGNOSIS — Z9181 History of falling: Secondary | ICD-10-CM | POA: Diagnosis not present

## 2015-09-21 DIAGNOSIS — E1165 Type 2 diabetes mellitus with hyperglycemia: Secondary | ICD-10-CM | POA: Diagnosis not present

## 2015-09-21 DIAGNOSIS — M1712 Unilateral primary osteoarthritis, left knee: Secondary | ICD-10-CM | POA: Diagnosis not present

## 2015-09-21 DIAGNOSIS — I5032 Chronic diastolic (congestive) heart failure: Secondary | ICD-10-CM | POA: Diagnosis not present

## 2015-09-21 DIAGNOSIS — R296 Repeated falls: Secondary | ICD-10-CM | POA: Diagnosis not present

## 2015-09-21 DIAGNOSIS — T83511A Infection and inflammatory reaction due to indwelling urethral catheter, initial encounter: Secondary | ICD-10-CM | POA: Diagnosis not present

## 2015-09-21 DIAGNOSIS — Z6841 Body Mass Index (BMI) 40.0 and over, adult: Secondary | ICD-10-CM | POA: Diagnosis not present

## 2015-09-21 DIAGNOSIS — Z8614 Personal history of Methicillin resistant Staphylococcus aureus infection: Secondary | ICD-10-CM | POA: Diagnosis not present

## 2015-09-21 DIAGNOSIS — I110 Hypertensive heart disease with heart failure: Secondary | ICD-10-CM | POA: Diagnosis not present

## 2015-09-21 DIAGNOSIS — I25119 Atherosclerotic heart disease of native coronary artery with unspecified angina pectoris: Secondary | ICD-10-CM | POA: Diagnosis not present

## 2015-09-21 DIAGNOSIS — R32 Unspecified urinary incontinence: Secondary | ICD-10-CM | POA: Diagnosis not present

## 2015-09-22 ENCOUNTER — Observation Stay (HOSPITAL_COMMUNITY)
Admission: EM | Admit: 2015-09-22 | Discharge: 2015-09-25 | Disposition: A | Discharge status: Home / Self Care | Payer: Medicare Other | Attending: Internal Medicine | Admitting: Internal Medicine

## 2015-09-22 ENCOUNTER — Emergency Department (HOSPITAL_COMMUNITY): Payer: Medicare Other

## 2015-09-22 ENCOUNTER — Encounter (HOSPITAL_COMMUNITY): Payer: Self-pay

## 2015-09-22 ENCOUNTER — Other Ambulatory Visit: Payer: Self-pay | Admitting: *Deleted

## 2015-09-22 ENCOUNTER — Encounter: Payer: Self-pay | Admitting: *Deleted

## 2015-09-22 DIAGNOSIS — M19071 Primary osteoarthritis, right ankle and foot: Secondary | ICD-10-CM | POA: Diagnosis not present

## 2015-09-22 DIAGNOSIS — Z6841 Body Mass Index (BMI) 40.0 and over, adult: Secondary | ICD-10-CM | POA: Diagnosis not present

## 2015-09-22 DIAGNOSIS — I110 Hypertensive heart disease with heart failure: Secondary | ICD-10-CM | POA: Insufficient documentation

## 2015-09-22 DIAGNOSIS — Z8673 Personal history of transient ischemic attack (TIA), and cerebral infarction without residual deficits: Secondary | ICD-10-CM | POA: Diagnosis not present

## 2015-09-22 DIAGNOSIS — N319 Neuromuscular dysfunction of bladder, unspecified: Secondary | ICD-10-CM | POA: Diagnosis not present

## 2015-09-22 DIAGNOSIS — Z87891 Personal history of nicotine dependence: Secondary | ICD-10-CM | POA: Diagnosis not present

## 2015-09-22 DIAGNOSIS — I2510 Atherosclerotic heart disease of native coronary artery without angina pectoris: Secondary | ICD-10-CM | POA: Diagnosis present

## 2015-09-22 DIAGNOSIS — Z96651 Presence of right artificial knee joint: Secondary | ICD-10-CM | POA: Diagnosis not present

## 2015-09-22 DIAGNOSIS — Z960 Presence of urogenital implants: Secondary | ICD-10-CM

## 2015-09-22 DIAGNOSIS — Z86718 Personal history of other venous thrombosis and embolism: Secondary | ICD-10-CM | POA: Diagnosis not present

## 2015-09-22 DIAGNOSIS — I639 Cerebral infarction, unspecified: Secondary | ICD-10-CM | POA: Diagnosis present

## 2015-09-22 DIAGNOSIS — R296 Repeated falls: Secondary | ICD-10-CM | POA: Diagnosis not present

## 2015-09-22 DIAGNOSIS — Z79899 Other long term (current) drug therapy: Secondary | ICD-10-CM | POA: Insufficient documentation

## 2015-09-22 DIAGNOSIS — E1151 Type 2 diabetes mellitus with diabetic peripheral angiopathy without gangrene: Secondary | ICD-10-CM | POA: Insufficient documentation

## 2015-09-22 DIAGNOSIS — R262 Difficulty in walking, not elsewhere classified: Secondary | ICD-10-CM

## 2015-09-22 DIAGNOSIS — M170 Bilateral primary osteoarthritis of knee: Secondary | ICD-10-CM | POA: Diagnosis not present

## 2015-09-22 DIAGNOSIS — M79641 Pain in right hand: Secondary | ICD-10-CM | POA: Diagnosis not present

## 2015-09-22 DIAGNOSIS — S199XXA Unspecified injury of neck, initial encounter: Secondary | ICD-10-CM | POA: Diagnosis not present

## 2015-09-22 DIAGNOSIS — I5032 Chronic diastolic (congestive) heart failure: Secondary | ICD-10-CM | POA: Diagnosis not present

## 2015-09-22 DIAGNOSIS — M25561 Pain in right knee: Secondary | ICD-10-CM | POA: Diagnosis not present

## 2015-09-22 DIAGNOSIS — G8929 Other chronic pain: Secondary | ICD-10-CM | POA: Diagnosis not present

## 2015-09-22 DIAGNOSIS — M19072 Primary osteoarthritis, left ankle and foot: Secondary | ICD-10-CM | POA: Diagnosis not present

## 2015-09-22 DIAGNOSIS — Z87440 Personal history of urinary (tract) infections: Secondary | ICD-10-CM | POA: Insufficient documentation

## 2015-09-22 DIAGNOSIS — E1143 Type 2 diabetes mellitus with diabetic autonomic (poly)neuropathy: Secondary | ICD-10-CM | POA: Diagnosis present

## 2015-09-22 DIAGNOSIS — E785 Hyperlipidemia, unspecified: Secondary | ICD-10-CM | POA: Diagnosis present

## 2015-09-22 DIAGNOSIS — M545 Low back pain: Secondary | ICD-10-CM | POA: Diagnosis not present

## 2015-09-22 DIAGNOSIS — Z794 Long term (current) use of insulin: Secondary | ICD-10-CM | POA: Diagnosis not present

## 2015-09-22 DIAGNOSIS — S0990XA Unspecified injury of head, initial encounter: Secondary | ICD-10-CM | POA: Diagnosis not present

## 2015-09-22 DIAGNOSIS — E1140 Type 2 diabetes mellitus with diabetic neuropathy, unspecified: Secondary | ICD-10-CM | POA: Diagnosis not present

## 2015-09-22 DIAGNOSIS — R609 Edema, unspecified: Secondary | ICD-10-CM

## 2015-09-22 DIAGNOSIS — M25552 Pain in left hip: Secondary | ICD-10-CM | POA: Diagnosis present

## 2015-09-22 DIAGNOSIS — M19041 Primary osteoarthritis, right hand: Secondary | ICD-10-CM | POA: Diagnosis not present

## 2015-09-22 DIAGNOSIS — Z7982 Long term (current) use of aspirin: Secondary | ICD-10-CM | POA: Insufficient documentation

## 2015-09-22 DIAGNOSIS — M19042 Primary osteoarthritis, left hand: Secondary | ICD-10-CM | POA: Insufficient documentation

## 2015-09-22 DIAGNOSIS — M25462 Effusion, left knee: Secondary | ICD-10-CM | POA: Diagnosis not present

## 2015-09-22 DIAGNOSIS — R52 Pain, unspecified: Secondary | ICD-10-CM

## 2015-09-22 DIAGNOSIS — K219 Gastro-esophageal reflux disease without esophagitis: Secondary | ICD-10-CM | POA: Diagnosis not present

## 2015-09-22 DIAGNOSIS — S8992XA Unspecified injury of left lower leg, initial encounter: Secondary | ICD-10-CM | POA: Diagnosis not present

## 2015-09-22 DIAGNOSIS — M25562 Pain in left knee: Secondary | ICD-10-CM | POA: Diagnosis not present

## 2015-09-22 DIAGNOSIS — Z993 Dependence on wheelchair: Secondary | ICD-10-CM | POA: Diagnosis not present

## 2015-09-22 DIAGNOSIS — G4733 Obstructive sleep apnea (adult) (pediatric): Secondary | ICD-10-CM | POA: Diagnosis not present

## 2015-09-22 DIAGNOSIS — IMO0002 Reserved for concepts with insufficient information to code with codable children: Secondary | ICD-10-CM | POA: Diagnosis present

## 2015-09-22 DIAGNOSIS — E1165 Type 2 diabetes mellitus with hyperglycemia: Secondary | ICD-10-CM | POA: Diagnosis present

## 2015-09-22 DIAGNOSIS — Z978 Presence of other specified devices: Secondary | ICD-10-CM

## 2015-09-22 LAB — CBC WITH DIFFERENTIAL/PLATELET
Basophils Absolute: 0.1 10*3/uL (ref 0.0–0.1)
Basophils Relative: 1 %
Eosinophils Absolute: 0.7 10*3/uL (ref 0.0–0.7)
Eosinophils Relative: 8 %
HCT: 40.8 % (ref 39.0–52.0)
Hemoglobin: 13.5 g/dL (ref 13.0–17.0)
Lymphocytes Relative: 16 %
Lymphs Abs: 1.4 10*3/uL (ref 0.7–4.0)
MCH: 30.2 pg (ref 26.0–34.0)
MCHC: 33.1 g/dL (ref 30.0–36.0)
MCV: 91.3 fL (ref 78.0–100.0)
Monocytes Absolute: 0.7 10*3/uL (ref 0.1–1.0)
Monocytes Relative: 8 %
Neutro Abs: 5.8 10*3/uL (ref 1.7–7.7)
Neutrophils Relative %: 67 %
Platelets: 205 10*3/uL (ref 150–400)
RBC: 4.47 MIL/uL (ref 4.22–5.81)
RDW: 13.2 % (ref 11.5–15.5)
WBC: 8.7 10*3/uL (ref 4.0–10.5)

## 2015-09-22 LAB — URINALYSIS, ROUTINE W REFLEX MICROSCOPIC
Bilirubin Urine: NEGATIVE
Glucose, UA: NEGATIVE mg/dL
Ketones, ur: NEGATIVE mg/dL
Leukocytes, UA: NEGATIVE
Nitrite: NEGATIVE
Protein, ur: 100 mg/dL — AB
Specific Gravity, Urine: 1.005 (ref 1.005–1.030)
pH: 6.5 (ref 5.0–8.0)

## 2015-09-22 LAB — BASIC METABOLIC PANEL
Anion gap: 10 (ref 5–15)
BUN: 11 mg/dL (ref 6–20)
CO2: 26 mmol/L (ref 22–32)
Calcium: 9 mg/dL (ref 8.9–10.3)
Chloride: 98 mmol/L — ABNORMAL LOW (ref 101–111)
Creatinine, Ser: 0.64 mg/dL (ref 0.61–1.24)
GFR calc Af Amer: >60 mL/min (ref >60)
GFR calc non Af Amer: >60 mL/min (ref >60)
Glucose, Bld: 259 mg/dL — ABNORMAL HIGH (ref 65–99)
Potassium: 4.4 mmol/L (ref 3.5–5.1)
Sodium: 134 mmol/L — ABNORMAL LOW (ref 135–145)

## 2015-09-22 LAB — URINE MICROSCOPIC-ADD ON: Squamous Epithelial / LPF: NONE SEEN

## 2015-09-22 LAB — CK: Total CK: 63 U/L (ref 49–397)

## 2015-09-22 MED ORDER — FENTANYL CITRATE (PF) 100 MCG/2ML IJ SOLN
50.0000 ug | Freq: Once | INTRAMUSCULAR | Status: DC
  Filled 2015-09-22: qty 2

## 2015-09-22 MED ORDER — LISINOPRIL 10 MG PO TABS
5.0000 mg | ORAL_TABLET | Freq: Every day | ORAL | Status: DC
  Administered 2015-09-23 – 2015-09-25 (×3): 5 mg via ORAL
  Filled 2015-09-22 (×4): qty 1

## 2015-09-22 MED ORDER — ISOSORBIDE MONONITRATE ER 30 MG PO TB24
60.0000 mg | ORAL_TABLET | Freq: Every day | ORAL | Status: DC
  Administered 2015-09-23 – 2015-09-25 (×4): 60 mg via ORAL
  Filled 2015-09-22 (×3): qty 2
  Filled 2015-09-22 (×2): qty 1

## 2015-09-22 MED ORDER — KETOROLAC TROMETHAMINE 15 MG/ML IJ SOLN
15.0000 mg | Freq: Once | INTRAMUSCULAR | Status: AC
  Administered 2015-09-22: 15 mg via INTRAMUSCULAR
  Filled 2015-09-22: qty 1

## 2015-09-22 MED ORDER — TRAMADOL HCL 50 MG PO TABS
50.0000 mg | ORAL_TABLET | Freq: Two times a day (BID) | ORAL | Status: DC | PRN
  Administered 2015-09-23 – 2015-09-24 (×3): 100 mg via ORAL
  Filled 2015-09-22 (×4): qty 2

## 2015-09-22 MED ORDER — MAGNESIUM OXIDE 400 (241.3 MG) MG PO TABS
400.0000 mg | ORAL_TABLET | Freq: Every day | ORAL | Status: DC
  Administered 2015-09-23 – 2015-09-25 (×3): 400 mg via ORAL
  Filled 2015-09-22 (×4): qty 1

## 2015-09-22 MED ORDER — EZETIMIBE 10 MG PO TABS
10.0000 mg | ORAL_TABLET | Freq: Every day | ORAL | Status: DC
  Administered 2015-09-23 – 2015-09-25 (×3): 10 mg via ORAL
  Filled 2015-09-22 (×4): qty 1

## 2015-09-22 MED ORDER — ROSUVASTATIN CALCIUM 20 MG PO TABS: 20.0000 mg | ORAL_TABLET | Freq: Every day | ORAL | Status: DC

## 2015-09-22 MED ORDER — HYDROMORPHONE HCL 1 MG/ML IJ SOLN
1.0000 mg | Freq: Once | INTRAMUSCULAR | Status: AC
Start: 2015-09-22 — End: 2015-09-22
  Administered 2015-09-22: 1 mg via INTRAMUSCULAR
  Filled 2015-09-22: qty 1

## 2015-09-22 MED ORDER — DIPHENOXYLATE-ATROPINE 2.5-0.025 MG PO TABS
1.0000 | ORAL_TABLET | Freq: Three times a day (TID) | ORAL | Status: DC | PRN
  Administered 2015-09-22: 1 via ORAL
  Filled 2015-09-22: qty 1

## 2015-09-22 MED ORDER — FUROSEMIDE 40 MG PO TABS
40.0000 mg | ORAL_TABLET | Freq: Every day | ORAL | Status: DC
  Administered 2015-09-22 – 2015-09-25 (×4): 40 mg via ORAL
  Filled 2015-09-22 (×4): qty 1

## 2015-09-22 MED ORDER — SENNOSIDES-DOCUSATE SODIUM 8.6-50 MG PO TABS: 2.0000 | ORAL_TABLET | Freq: Two times a day (BID) | ORAL | Status: DC | PRN

## 2015-09-22 MED ORDER — LORATADINE 10 MG PO TABS
10.0000 mg | ORAL_TABLET | Freq: Every day | ORAL | Status: DC
  Administered 2015-09-22 – 2015-09-25 (×4): 10 mg via ORAL
  Filled 2015-09-22 (×4): qty 1

## 2015-09-22 MED ORDER — PANTOPRAZOLE SODIUM 40 MG PO TBEC
40.0000 mg | DELAYED_RELEASE_TABLET | Freq: Every day | ORAL | Status: DC
  Administered 2015-09-22 – 2015-09-25 (×4): 40 mg via ORAL
  Filled 2015-09-22 (×4): qty 1

## 2015-09-22 MED ORDER — ASPIRIN EC 81 MG PO TBEC
81.0000 mg | DELAYED_RELEASE_TABLET | Freq: Every morning | ORAL | Status: DC
  Administered 2015-09-23 – 2015-09-25 (×3): 81 mg via ORAL
  Filled 2015-09-22 (×4): qty 1

## 2015-09-22 MED ORDER — FENTANYL CITRATE (PF) 100 MCG/2ML IJ SOLN
50.0000 ug | Freq: Once | INTRAMUSCULAR | Status: AC
  Administered 2015-09-22: 50 ug via INTRAMUSCULAR

## 2015-09-22 MED ORDER — INSULIN NPH (HUMAN) (ISOPHANE) 100 UNIT/ML ~~LOC~~ SUSP
28.0000 [IU] | Freq: Two times a day (BID) | SUBCUTANEOUS | Status: DC
  Administered 2015-09-23 – 2015-09-25 (×6): 28 [IU] via SUBCUTANEOUS
  Filled 2015-09-22: qty 10

## 2015-09-22 MED ORDER — NITROGLYCERIN 0.4 MG SL SUBL: 0.4000 mg | SUBLINGUAL_TABLET | SUBLINGUAL | Status: DC | PRN

## 2015-09-22 NOTE — Progress Notes (Addendum)
[name] met with patient at bedside. There was no family present.  Patient was informed by Nurse CM that patient lives alone and is not able to care for himself appropriately.   Patient confirms that he lives at home alone. Patient answered indirectly when asked about needing assistance with completing ADL's. Initially, patient denied needed help. However, he later stated "  I just don't do certain stuff anymore".  Patient states he has support from his friend Gerald Stabs, but states his friend is only able to come to his home x1 a week to assist. Patient states he would be open to go to a facility. However, he states that he has used all of his medicare days. Patient does not believe he has enough wellness days in between facility visits. Patient informed [name] that he does not have the funds to pay privately for a facility. [name] made EDP aware.  [name] reached out to [employer] and asked to speak with on-call administrator. However, according to charge nurse there was no on-call administrator available.   [name] spoke with [facility]/ [facility] nurse and asked if the patient can be readmitted to their facility tonight. Charge nurse states that the patient cannot be readmitted to [facility]. She states that it will be best to call the facility in the morning and reach out to the social worker or Admissions. [name] made Nurse CM aware, who states she has discussed patient with Market researcher.  Charge nurse states that she believes the patient was at the facility for 2 and a half months before and just recently there for 4 days recently.   [name] encouraged patient to apply for medicaid.  [name] staffed case with [place] Surveyor, quantity.  Willette Brace [phone] ED [name] 09/22/2015 10:29 PM

## 2015-09-22 NOTE — ED Notes (Signed)
Per EMS, pt from home.  Pt getting up to change trash can using walker and lost balance and fell.  Occurred 2 days ago.  Home health was there yesterday, no report or visit filed.  Today pt c/o knee pain left side.  Pt called EMS.  Pt also c/o rt hand pain.  Pt has wounds to legs. EMS reports home has trash everywhere, food everywhere, urine containers full of urine.  Pt cannot get into kitchen anymore.  Pt gets home health for his wounds but unknown for family visits.  Vitals: cbg 320 (pt took 3 units at home), 138/68, 98% ra, 72 hr, resp 18.

## 2015-09-22 NOTE — ED Notes (Signed)
Pt is refusing to ambulate with out a bariatric walker similar to what he uses at home MD made aware call ortho to see if we can get one

## 2015-09-22 NOTE — Progress Notes (Signed)
[name] attempted to speak with patient. Patient was out of the room at the time. [name] staffed with nurse.   Genice Rouge [id number] ED [name] 09/22/2015 12:42 PM

## 2015-09-22 NOTE — ED Notes (Signed)
All meds for 1632 were scanned in room and patient was scanned. Narrator says "Pended for 01/12 16:31. Documentation must be completed." Charge nurse and Pharmacist made aware of same. Meds have been thrown in trash.

## 2015-09-22 NOTE — Progress Notes (Signed)
Order placed for hospital bed.  [facility] chosen as dme agency.

## 2015-09-22 NOTE — Progress Notes (Signed)
[name] received telephone from ED Secretary to call  EDP.  [name] received call from EDP regarding patient. [name] was informed by EDP patient had no issues to be an acute admit and that patient was morbidly obese. [name] was informed patient was from [place]. [name] was informed that patient reports he left [facility] due to his benefits ending.  Genice Rouge [id number] ED [name] 09/22/2015 3:42 PM

## 2015-09-22 NOTE — Patient Outreach (Addendum)
[facility]) Care Management  [facility] Social Work  09/22/2015  [name] April 29, 1946 [id number]  Subjective:    "I'm back home".  Objective:   [name] explained to patient that [name] would be following patient to assess and assist with possible social work needs and services.     Current Outpatient Prescriptions  Medication Sig Dispense Refill  . acetaminophen (TYLENOL) 325 MG tablet Take 2 tablets (650 mg total) by mouth every 4 (four) hours as needed for headache or mild pain.    Marland Kitchen aspirin EC 81 MG tablet Take 81 mg by mouth every morning.     . Chlorhexidine Gluconate Cloth 2 % PADS Apply 6 each topically daily at 6 (six) AM. (Patient not taking: Reported on 09/22/2015) 5 each 0  . diphenoxylate-atropine (LOMOTIL) 2.5-0.025 MG tablet Take 1 tablet by mouth 3 (three) times daily as needed for diarrhea or loose stools.   2  . ertapenem 1 g in sodium chloride 0.9 % 50 mL Inject 1 g into the vein daily. 10 Dose 0  . ezetimibe (ZETIA) 10 MG tablet Take 1 tablet (10 mg total) by mouth daily. 30 tablet 11  . fexofenadine (ALLEGRA) 180 MG tablet Take 180 mg by mouth daily as needed for allergies or rhinitis.    . furosemide (LASIX) 40 MG tablet Take 1 tablet (40 mg total) by mouth daily. 30 tablet 11  . insulin NPH Human (HUMULIN N) 100 UNIT/ML injection Inject 0.28 mLs (28 Units total) into the skin 2 (two) times daily at 8 am and 10 pm. (Patient taking differently: Inject 20-30 Units into the skin 2 (two) times daily at 8 am and 10 pm. ) 10 mL 11  . isosorbide mononitrate (IMDUR) 60 MG 24 hr tablet Take 1 tablet (60 mg total) by mouth daily.    Marland Kitchen lisinopril (PRINIVIL,ZESTRIL) 5 MG tablet Take 5 mg by mouth daily.  3  . magnesium oxide (MAG-OX) 400 (241.3 Mg) MG tablet Take 1 tablet (400 mg total) by mouth daily. 10 tablet 0  . metoprolol (LOPRESSOR) 50 MG tablet Take 50 mg by mouth 2 (two) times daily.    . nitroGLYCERIN (NITROSTAT) 0.4 MG SL tablet Place 1 tablet (0.4 mg  total) under the tongue every 5 (five) minutes as needed for chest pain. 25 tablet 2  . omeprazole (PRILOSEC) 20 MG capsule Take 20 mg by mouth daily.    Marland Kitchen oxybutynin (DITROPAN) 5 MG tablet Take 5-15 mg by mouth daily as needed for bladder spasms.     . rosuvastatin (CRESTOR) 20 MG tablet Take 1 tablet (20 mg total) by mouth daily at 6 PM. (Patient not taking: Reported on 09/22/2015)    . senna-docusate (SENOKOT-S) 8.6-50 MG per tablet Take 2 tablets by mouth daily. (Patient taking differently: Take 2 tablets by mouth 2 (two) times daily as needed for moderate constipation. ) 28 tablet 0  . traMADol (ULTRAM) 50 MG tablet Take 50-100 mg by mouth 2 (two) times daily as needed for moderate pain.      No current facility-administered medications for this visit.    Functional Status:  In your present state of health, do you have any difficulty performing the following activities: 09/22/2015 09/07/2015  Hearing? [name]  Vision? Y Y  Difficulty concentrating or making decisions? N N  Walking or climbing stairs? Y Y  Dressing or bathing? Y Y  Doing errands, shopping? [name]  Preparing Food and eating ? Y -  Using the Toilet?  Y -  In the past six months, have you accidently leaked urine? Y -  Do you have problems with loss of bowel control? N -  Managing your Medications? Y -  Managing your Finances? Y -  Housekeeping or managing your Housekeeping? Y -    Fall/Depression Screening:  PHQ 2/9 Scores 09/22/2015 07/15/2015 05/20/2015  PHQ - 2 Score 0 2 0  PHQ- 9 Score - 7 -    Assessment:   [name] was able to converse with patient today to perform the initial phone assessment, as well as assess and assist with social work needs and services.  [name] went to the skilled nursing facility, not realizing that patient had been discharged back home.  Patient remembers having met with [name] during several previous skilled nursing admissions. Patient admitted to [name] that he has returned home from [name] where patient was residing to receive short-term rehabilitative services, to live alone.  [name] strongly encouraged patient to consider receiving long-term care services, either at home or in a skilled nursing facility, due to patient's recurrent emergency department visits and hospital admissions.  Patient reported, "Well, then you'll need to speak with my friend, [name] because he currently handles all of my affairs".  [name] agreed to contact [name] to discuss discharge planning needs and services for patient.  Plan:   [name] will contact patient's friend, [name], per patient's request, to discuss discharge planning needs and services for patient. [name] will plan to meet with patient at home for a routine home visit on Thursday, October 06, 2015 at 9:00am. [name] will prescribe and print [name] information for patient to review at the next scheduled visit. [name] will fax a correspondence letter to patient's Primary Care Physician, [name] to ensure that [name] is aware of [name]'s involvement with patient's care. [name] will converse with patient's RNCM with [name], [name] to report findings of initial visit with patient today.  [name], BSW, MSW, LCSW  Licensed Education officer, [facility]  Mailing [place] [address] [place], [place] [address] Physical Address-[address], [place], [place] [address] Toll Free Main # [phone] Fax # [phone] Cell # [phone]  Fax # [phone]  [name]

## 2015-09-22 NOTE — Progress Notes (Addendum)
Per chart review, patient was supposed to be discharged from hospital on 01/03 but was held and discharged on 01/05 due to having a syncopal episode.  At this time, patient was to have 8 more doses of IV antibiotics to be finished on the 12th.  Patient presents to ED today post fall at home.  Patient reports he has been home now for three days.  EDCM spoke to patient at bedside.  Patient reports he lives at home alone.  Patient has a friend named [name] [phone] who checks in on him once a week.  Patient has a son Linna Hoff who lives in [place].  Patient reports having no other form of support.  Patient reports he has home health services but cannot remember the name of the agency.  Patient reports he has a Therapist, sports and a physical therapist called him today and told him to come to the hospital, "Because there is not much we can do for you if you can't walk"  Per patient.  Patient reports his pcp is [name] whom he states arranged home health for the patient.  Patient reports he has a bedside commode, wheelchair, a walker with wheels and a regular walker at home.  Patient reports he is usually able to contact [facility] for transportation to his appointments.  Patient reports he has an appointment on the 18th at [facility] for an Clinical research associate.   EDCM informed patient about private duty services, but patient reports he is unable to afford these services.  Patient reports he is "out of bed days" and cannot afford to stay at [facility].  [facility] consulted EDSW.  EDCM called [place]  @ [phone] spoke to [place] who reports patient was discharged on the 9th.  Glenard Haring unable to tell [facility] why patient was discharged without receiving full course of antibiotics.  Glenard Haring reports patient was discharged with home health services but was unable to recall the name of the agency.  EDCM was told to call the social worker tomorrow for more information as Education officer, museum has left for today.  EDCM spoke to [facility]  infusion RN for [facility] who reports would not be able to come out to patient unless he is open and active with [facility], and would not be able to come out for just PICC line care.  PICC line would have to be removed by pcp.  EDCM called [facility] @1841pm  and spoke to on call RN Larene Beach who reports patient is not active with them. EDCM called [facility] @1857pm  to check for services, awaiting for call back EDCM called [facility] at 1900pm to check for services, awaiting call back.  [facility] discussed patient with EDP discussed having IV team flush PICC line if appropriate, also to ambulate patient.  [facility] received phone call back from [place] on call RN from [facility] @ [phone] reporting patient is not active with them currently but has had him in the past discharged in May, but would be happy to assist him again.  [facility] received phone call back from on call RN Sharyn Lull of [facility] at 2018pm who reports she is unable to check if patient is active with [facility], she did not have a computer.  EDCM went back to patient's room to see if remembered the home health agency who is assisting him.  He reports, "they are out of [place]."  [facility] asked patient if [facility] home services was the agency.  Patient stated, "I think so."  [facility] placed call to [facility] home care awaiting  call back.  [facility] received phone call back from [facility] care reporting a referral was sent to them for RN, PT, OT and aide.  She reports, "We haven't opened him yet, but we are trying too."  2120pm Per Pam infusion RN of [facility], will work with [place] if he needs IV ABX.  Discussed patient with medical director.

## 2015-09-22 NOTE — ED Notes (Signed)
Blood draw, IV and meds delayed d/t transport to CT

## 2015-09-22 NOTE — ED Notes (Signed)
Pt cleaned.  Long sitting feces noted over lower back and upper leg/bottom.

## 2015-09-22 NOTE — ED Notes (Signed)
Patient transported to CT 

## 2015-09-22 NOTE — ED Notes (Signed)
Bed: [id number] Expected date:  Expected time:  Means of arrival:  Comments: EMS- 70 yo M, fall/knee pain/bed bound

## 2015-09-22 NOTE — ED Provider Notes (Signed)
CSN: [id number]     Arrival date & time 09/22/15  1108 History   First MD Initiated Contact with Patient 09/22/15 1116     Chief Complaint  Patient presents with  . Fall     (Consider location/radiation/quality/duration/timing/severity/associated sxs/prior Treatment) HPI   70 year old male with left lower extremity pain. Patient reports that he is trying to change the bag in a garbage can when he lost his balance and fell onto his right side. Persistent R knee pain since to the point that he cannot bear weight. At baseline he describes poor mobility with only able to ambulate short distances with a walker. Did strike his head. No LOC. Some pain in occipital region/upper neck. No blood thinners. No acute neurological complaints.   Recent hospitalization with urinary tract infection due to extended-spectrum beta lactamase (ESBL)-producing Klebsiella. He initially went to a skilled nursing facility but reports that he was discharged back to his residence 4 days ago. He was supposed to complete a course of ertapenem through right arm PICC up until today. He says he has not had any abx since he has been home though.   EMS reports that home in very poor upkeep. Trash everywhere to point that difficult to get into certain areas of home. Containers full of urine and general disarray.   Past Medical History  Diagnosis Date  . Obese   . Hypertension   . Diabetic neuropathy, painful ([place])   . Chronic indwelling Foley catheter   . Bladder spasms   . Hyperlipidemia   . GERD (gastroesophageal reflux disease)   . Neurogenic bladder   . CAD (coronary artery disease)   . PONV (postoperative nausea and vomiting)   . CHF (congestive heart failure) ([place])   . DVT (deep venous thrombosis) ([facility]) 1980's    LLE  . Type II diabetes mellitus ([place])   . Diabetic diarrhea ([place])   . Pneumonia 1999  . OSA (obstructive sleep apnea)     "they wanted me to wear a mask; I couldn't" (11/10/2014)  . Arthritis      "hands, ankles, knees" (11/10/2014)  . Chronic lower back pain   . Stroke [facility]) 10/2011; 06/2014    right hand numbness/notes 10/20/2011, pt not sure he had stroke in 2013; "right hand weaker and right face not quite right since" (11/10/2014)   Past Surgical History  Procedure Laterality Date  . Gastroplasty    . Total knee arthroplasty Right 1990's  . Left heart catheterization with coronary angiogram N/A 10/24/2011    Procedure: LEFT HEART CATHETERIZATION WITH CORONARY ANGIOGRAM;  Surgeon: Candee Furbish, MD;  Location: [facility] CATH LAB;  Service: Cardiovascular;  Laterality: N/A;  right radial artery approach  . Cholecystectomy open  1970's?  . Joint replacement    . Carpal tunnel release Bilateral ~ 2003-2004  . Cardiac catheterization N/A 05/02/2015    Procedure: Left Heart Cath and Coronary Angiography;  Surgeon: Lorretta Harp, MD;  Location: [place] CV LAB;  Service: Cardiovascular;  Laterality: N/A;  . Cardiac catheterization N/A 05/04/2015    Procedure: Left Heart Cath and Coronary Angiography;  Surgeon: Wellington Hampshire, MD;  Location: [place] CV LAB;  Service: Cardiovascular;  Laterality: N/A;   Family History  Problem Relation Age of Onset  . Hypertension Mother   . Diabetes type I Father   . Cancer Brother      Testicular Cancer  . Cancer Brother     Leukemia   Social History  Substance Use Topics  .  Smoking status: Former Smoker -- 2.00 packs/day for 20 years    Types: Cigarettes    Quit date: 09/10/1976  . Smokeless tobacco: Never Used  . Alcohol Use: No     Comment: FORMER ALCOHOLIC; "sober since [id number]"    Review of Systems  All systems reviewed and negative, other than as noted in HPI.   Allergies  Ace inhibitors; Lipitor; Metformin and related; Cefadroxil; Cephalexin; Morphine and related; and Robaxin  Home Medications   Prior to Admission medications   Medication Sig Start Date End Date Taking? Authorizing Provider  aspirin EC 81 MG tablet Take 81 mg  by mouth every morning.    Yes Historical Provider, MD  diphenoxylate-atropine (LOMOTIL) 2.5-0.025 MG tablet Take 1 tablet by mouth 3 (three) times daily as needed for diarrhea or loose stools.  06/14/15  Yes Historical Provider, MD  ertapenem 1 g in sodium chloride 0.9 % 50 mL Inject 1 g into the vein daily. 09/13/15 09/22/15 Yes Nishant Dhungel, MD  ezetimibe (ZETIA) 10 MG tablet Take 1 tablet (10 mg total) by mouth daily. 05/06/15  Yes Rhonda G Barrett, PA-C  fexofenadine (ALLEGRA) 180 MG tablet Take 180 mg by mouth daily as needed for allergies or rhinitis.   Yes Historical Provider, MD  furosemide (LASIX) 40 MG tablet Take 1 tablet (40 mg total) by mouth daily. 04/03/14  Yes Lavone Orn, MD  insulin NPH Human (HUMULIN N) 100 UNIT/ML injection Inject 0.28 mLs (28 Units total) into the skin 2 (two) times daily at 8 am and 10 pm. Patient taking differently: Inject 20-30 Units into the skin 2 (two) times daily at 8 am and 10 pm.  09/12/15  Yes Nishant Dhungel, MD  isosorbide mononitrate (IMDUR) 60 MG 24 hr tablet Take 1 tablet (60 mg total) by mouth daily. 07/06/15  Yes Hosie Poisson, MD  lisinopril (PRINIVIL,ZESTRIL) 5 MG tablet Take 5 mg by mouth daily. 06/23/15  Yes Historical Provider, MD  nitroGLYCERIN (NITROSTAT) 0.4 MG SL tablet Place 1 tablet (0.4 mg total) under the tongue every 5 (five) minutes as needed for chest pain. 12/08/14  Yes Luke K Kilroy, PA-C  omeprazole (PRILOSEC) 20 MG capsule Take 20 mg by mouth daily.   Yes Historical Provider, MD  oxybutynin (DITROPAN) 5 MG tablet Take 5-15 mg by mouth daily as needed for bladder spasms.    Yes Historical Provider, MD  senna-docusate (SENOKOT-S) 8.6-50 MG per tablet Take 2 tablets by mouth daily. Patient taking differently: Take 2 tablets by mouth 2 (two) times daily as needed for moderate constipation.  05/11/15  Yes Carmin Muskrat, MD  traMADol (ULTRAM) 50 MG tablet Take 50-100 mg by mouth 2 (two) times daily as needed for moderate pain.  09/21/15   Yes Historical Provider, MD  acetaminophen (TYLENOL) 325 MG tablet Take 2 tablets (650 mg total) by mouth every 4 (four) hours as needed for headache or mild pain. 12/08/14   Erlene Quan, PA-C  Chlorhexidine Gluconate Cloth 2 % PADS Apply 6 each topically daily at 6 (six) AM. Patient not taking: Reported on 09/22/2015 09/12/15   Nishant Dhungel, MD  magnesium oxide (MAG-OX) 400 (241.3 Mg) MG tablet Take 1 tablet (400 mg total) by mouth daily. 09/15/15   Simbiso Ranga, MD  metoprolol (LOPRESSOR) 50 MG tablet Take 50 mg by mouth 2 (two) times daily. 09/21/15   Historical Provider, MD  rosuvastatin (CRESTOR) 20 MG tablet Take 1 tablet (20 mg total) by mouth daily at 6 PM. Patient not taking: Reported  on 09/22/2015 07/06/15   Hosie Poisson, MD   BP 127/62 mmHg  Pulse 72  Temp(Src) 97.7 F (36.5 C) (Oral)  Resp 18  SpO2 98% Physical Exam  Constitutional: He is oriented to person, place, and time. He appears well-developed and well-nourished. No distress.  Laying in bed. NAD. Morbidly obese.   HENT:  Head: Normocephalic and atraumatic.  TTP base of occiput at midline/upper cervical spine.   Eyes: Conjunctivae are normal. Right eye exhibits no discharge. Left eye exhibits no discharge.  Neck: Neck supple.  Cardiovascular: Normal rate, regular rhythm and normal heart sounds.  Exam reveals no gallop and no friction rub.   No murmur heard. PICC line right upper extremity  Pulmonary/Chest: Effort normal and breath sounds normal. No respiratory distress.  Abdominal: Soft. He exhibits no distension. There is no tenderness.  Genitourinary:  foley  Musculoskeletal: He exhibits no edema or tenderness.  Exam limited by body habitus. LLE seems symmetric as compared to L. No shortening or malrotation. L knee TTP, worse medially. Increased pain with both passive and active ROM. Cannot actively range 2/2 pain. NVI distally. Subacute appearing ecchymosis to dorsal aspect R hand over 1st/2nd metacarpals. TTP 1st  metatarsal. Can actively range at wrist and all MP joints.   Neurological: He is alert and oriented to person, place, and time. No cranial nerve deficit. He exhibits normal muscle tone. Coordination normal.  Assessment of LLE strength limited by pain. No focal motor deficit appreciated though.   Skin: Skin is warm and dry.  Psychiatric: He has a normal mood and affect. His behavior is normal. Thought content normal.  Nursing note and vitals reviewed.   ED Course  Procedures (including critical care time) Labs Review Labs Reviewed  BASIC METABOLIC PANEL - Abnormal; Notable for the following:    Sodium 134 (*)    Chloride 98 (*)    Glucose, Bld 259 (*)    All other components within normal limits  CBC WITH DIFFERENTIAL/PLATELET  CK    Imaging Review Ct Head Wo Contrast  09/22/2015  CLINICAL DATA:  Status post fall. Loss balance. Occurred 2 days ago. EXAM: CT HEAD WITHOUT CONTRAST CT CERVICAL SPINE WITHOUT CONTRAST TECHNIQUE: Multidetector CT imaging of the head and cervical spine was performed following the standard protocol without intravenous contrast. Multiplanar CT image reconstructions of the cervical spine were also generated. COMPARISON:  04/14/2015 FINDINGS: CT HEAD FINDINGS There is no evidence of mass effect, midline shift, or extra-axial fluid collections. There is no evidence of a space-occupying lesion or intracranial hemorrhage. There is no evidence of a cortical-based area of acute infarction. There are calcifications in bilateral base a ganglia and thalamus. There is generalized cerebral atrophy. There is periventricular white matter low attenuation likely secondary to microangiopathy. The ventricles and sulci are appropriate for the patient's age. The basal cisterns are patent. Visualized portions of the orbits are unremarkable. The visualized portions of the paranasal sinuses and mastoid air cells are unremarkable. Cerebrovascular atherosclerotic calcifications are noted. The  osseous structures are unremarkable. CT CERVICAL SPINE FINDINGS The alignment is anatomic. The vertebral body heights are maintained. There is no acute fracture. There is no static listhesis. The prevertebral soft tissues are normal. The intraspinal soft tissues are not fully imaged on this examination due to poor soft tissue contrast, but there is no gross soft tissue abnormality. The disc spaces are maintained. There are mild right uncovertebral degenerative changes at C3-4. The visualized portions of the lung apices demonstrate no focal abnormality.  There is bilateral carotid artery atherosclerosis. IMPRESSION: 1. No acute intracranial pathology. 2. No acute osseous injury of the cervical spine. Electronically Signed   By: Kathreen Devoid   On: 09/22/2015 13:14   Ct Cervical Spine Wo Contrast  09/22/2015  CLINICAL DATA:  Status post fall. Loss balance. Occurred 2 days ago. EXAM: CT HEAD WITHOUT CONTRAST CT CERVICAL SPINE WITHOUT CONTRAST TECHNIQUE: Multidetector CT imaging of the head and cervical spine was performed following the standard protocol without intravenous contrast. Multiplanar CT image reconstructions of the cervical spine were also generated. COMPARISON:  04/14/2015 FINDINGS: CT HEAD FINDINGS There is no evidence of mass effect, midline shift, or extra-axial fluid collections. There is no evidence of a space-occupying lesion or intracranial hemorrhage. There is no evidence of a cortical-based area of acute infarction. There are calcifications in bilateral base a ganglia and thalamus. There is generalized cerebral atrophy. There is periventricular white matter low attenuation likely secondary to microangiopathy. The ventricles and sulci are appropriate for the patient's age. The basal cisterns are patent. Visualized portions of the orbits are unremarkable. The visualized portions of the paranasal sinuses and mastoid air cells are unremarkable. Cerebrovascular atherosclerotic calcifications are noted.  The osseous structures are unremarkable. CT CERVICAL SPINE FINDINGS The alignment is anatomic. The vertebral body heights are maintained. There is no acute fracture. There is no static listhesis. The prevertebral soft tissues are normal. The intraspinal soft tissues are not fully imaged on this examination due to poor soft tissue contrast, but there is no gross soft tissue abnormality. The disc spaces are maintained. There are mild right uncovertebral degenerative changes at C3-4. The visualized portions of the lung apices demonstrate no focal abnormality. There is bilateral carotid artery atherosclerosis. IMPRESSION: 1. No acute intracranial pathology. 2. No acute osseous injury of the cervical spine. Electronically Signed   By: Kathreen Devoid   On: 09/22/2015 13:14   Ct Knee Left Wo Contrast  09/22/2015  CLINICAL DATA:  [place] 2 days ago.  Left knee pain. EXAM: CT OF THE LEFT KNEE WITHOUT CONTRAST TECHNIQUE: Multidetector CT imaging of the left knee was performed according to the standard protocol. Multiplanar CT image reconstructions were also generated. COMPARISON:  Radiographs 09/22/2015 FINDINGS: There are moderate to advanced tricompartmental degenerative changes most significant in the lateral compartment. There is joint space narrowing, osteophytic spurring and subchondral cystic change. No acute fracture or osteochondral lesion. There is a moderate-sized joint effusion. Moderate to advanced fatty atrophy of the lower thigh and calf musculature. IMPRESSION: 1. Tricompartmental degenerative changes. 2. No acute fracture. 3. Moderate-sized joint effusion. 4. Advanced fatty atrophy of the lower thigh and upper calf musculature. Electronically Signed   By: Marijo Sanes M.D.   On: 09/22/2015 15:46   Dg Knee Complete 4 Views Left  09/22/2015  CLINICAL DATA:  Status post fall.  Left hip pain. EXAM: LEFT KNEE - COMPLETE 4+ VIEW COMPARISON:  None. FINDINGS: Generalized osteopenia. No fracture or dislocation.  Tricompartmental osteoarthritis of the left knee most severe in the lateral femorotibial compartment. There is no aggressive lytic or sclerotic osseous lesion. IMPRESSION: No acute osseous injury of the left knee. Electronically Signed   By: Kathreen Devoid   On: 09/22/2015 13:16   Dg Hand Complete Right  09/22/2015  CLINICAL DATA:  Pain following fall 2 days prior EXAM: RIGHT HAND - COMPLETE 3+ VIEW COMPARISON:  None. FINDINGS: Frontal, oblique, and lateral views were obtained. There is no demonstrable fracture or dislocation. There is marked narrowing of the  radiocarpal joint. There is milder narrowing in the scaphotrapezial and first carpal -metacarpal joints. There is also moderate narrowing in the first MCP joint. No erosive change. There are scattered foci of arterial vascular calcification, primarily in the radial artery. IMPRESSION: Multifocal osteoarthritic change, most marked in the radiocarpal joint. Radial artery vascular calcification. No acute fracture or dislocation. Electronically Signed   By: Lowella Grip III M.D.   On: 09/22/2015 13:20   Dg Hip Unilat With Pelvis 2-3 Views Left  09/22/2015  CLINICAL DATA:  Pain following fall 2 days prior EXAM: DG HIP (WITH OR WITHOUT PELVIS) 2-3V LEFT COMPARISON:  None. FINDINGS: Frontal pelvis as well as frontal and lateral left hip images were obtained. There is no fracture or dislocation. There is advanced osteoarthritic change in the right hip joint with marked loss of joint space on the right and multiple subchondral cystic changes in the right femoral head. There is slight narrowing of the left hip joint. No erosive changes. IMPRESSION: No fracture or dislocation. Advanced osteoarthritic change right hip joint. Slight narrowing left hip joint. Electronically Signed   By: Lowella Grip III M.D.   On: 09/22/2015 13:17   I have personally reviewed and evaluated these images and lab results as part of my medical decision-making.   EKG  Interpretation None      MDM   Final diagnoses:  Left knee pain  Acute pain  Inability to ambulate due to knee    70 year old male with L knee pain after mechanical fall. Patient reports that he is unable to bear weight on it. Imaging including CT without acute osseous abnormality. Additionally imaged head, cervical spine and hip which are without any acute abnormalities either.   Several social concerns. Patient was recently admitted with ESBL Klebsiella UTI. Did not complete recommended course of ertapenem but may have had enough to be adequately treated. Says he couldn't afford it? He is afebrile. No leukocytosis. We'll send a urinalysis and urine culture.   Patient was discharged to nursing facility, but is currently back in his home residence. EMS reports very unhygienic living situation. Arrived to ED covered in feces.  Says he cannot bear weight on leg because of pain. Morbidly obese and limited mobility even before this fall. I do not feel I can safely discharge him back to his residence. Social work consulted to help with disposition to a safe environment or possible arrange additional home resources.    Virgel Manifold, MD 09/25/15 [phone]

## 2015-09-23 ENCOUNTER — Other Ambulatory Visit: Payer: Self-pay | Admitting: *Deleted

## 2015-09-23 ENCOUNTER — Encounter (HOSPITAL_COMMUNITY): Payer: Self-pay

## 2015-09-23 ENCOUNTER — Observation Stay (HOSPITAL_COMMUNITY): Payer: Medicare Other

## 2015-09-23 DIAGNOSIS — Z9289 Personal history of other medical treatment: Secondary | ICD-10-CM | POA: Diagnosis not present

## 2015-09-23 DIAGNOSIS — R262 Difficulty in walking, not elsewhere classified: Secondary | ICD-10-CM | POA: Diagnosis present

## 2015-09-23 DIAGNOSIS — I5032 Chronic diastolic (congestive) heart failure: Secondary | ICD-10-CM | POA: Diagnosis not present

## 2015-09-23 DIAGNOSIS — E785 Hyperlipidemia, unspecified: Secondary | ICD-10-CM

## 2015-09-23 DIAGNOSIS — M25562 Pain in left knee: Secondary | ICD-10-CM | POA: Diagnosis present

## 2015-09-23 DIAGNOSIS — M179 Osteoarthritis of knee, unspecified: Secondary | ICD-10-CM | POA: Diagnosis not present

## 2015-09-23 DIAGNOSIS — E1165 Type 2 diabetes mellitus with hyperglycemia: Secondary | ICD-10-CM | POA: Diagnosis not present

## 2015-09-23 DIAGNOSIS — M25552 Pain in left hip: Secondary | ICD-10-CM | POA: Diagnosis present

## 2015-09-23 LAB — CBC
HCT: 38.5 % — ABNORMAL LOW (ref 39.0–52.0)
Hemoglobin: 12.8 g/dL — ABNORMAL LOW (ref 13.0–17.0)
MCH: 30.4 pg (ref 26.0–34.0)
MCHC: 33.2 g/dL (ref 30.0–36.0)
MCV: 91.4 fL (ref 78.0–100.0)
PLATELETS: 231 10*3/uL (ref 150–400)
RBC: 4.21 MIL/uL — AB (ref 4.22–5.81)
RDW: 13.3 % (ref 11.5–15.5)
WBC: 9.1 10*3/uL (ref 4.0–10.5)

## 2015-09-23 LAB — COMPREHENSIVE METABOLIC PANEL
ALT: 17 U/L (ref 17–63)
AST: 18 U/L (ref 15–41)
Albumin: 2.7 g/dL — ABNORMAL LOW (ref 3.5–5.0)
Alkaline Phosphatase: 84 U/L (ref 38–126)
Anion gap: 10 (ref 5–15)
BUN: 12 mg/dL (ref 6–20)
CALCIUM: 8.5 mg/dL — AB (ref 8.9–10.3)
CHLORIDE: 98 mmol/L — AB (ref 101–111)
CO2: 27 mmol/L (ref 22–32)
CREATININE: 0.63 mg/dL (ref 0.61–1.24)
Glucose, Bld: 210 mg/dL — ABNORMAL HIGH (ref 65–99)
Potassium: 4.3 mmol/L (ref 3.5–5.1)
Sodium: 135 mmol/L (ref 135–145)
Total Bilirubin: 1 mg/dL (ref 0.3–1.2)
Total Protein: 5.9 g/dL — ABNORMAL LOW (ref 6.5–8.1)

## 2015-09-23 LAB — URINE CULTURE: Culture: NO GROWTH

## 2015-09-23 LAB — GLUCOSE, CAPILLARY
GLUCOSE-CAPILLARY: 177 mg/dL — AB (ref 65–99)
GLUCOSE-CAPILLARY: 109 mg/dL — AB (ref 65–99)
GLUCOSE-CAPILLARY: 175 mg/dL — AB (ref 65–99)
Glucose-Capillary: 116 mg/dL — ABNORMAL HIGH (ref 65–99)

## 2015-09-23 LAB — BRAIN NATRIURETIC PEPTIDE: B NATRIURETIC PEPTIDE 5: 130.9 pg/mL — AB (ref 0.0–100.0)

## 2015-09-23 LAB — PROTIME-INR
INR: 1.15 (ref 0.00–1.49)
PROTHROMBIN TIME: 14.9 s (ref 11.6–15.2)

## 2015-09-23 MED ORDER — SODIUM CHLORIDE 0.9 % IJ SOLN
10.0000 mL | INTRAMUSCULAR | Status: DC | PRN
  Administered 2015-09-23 – 2015-09-24 (×2): 10 mL
  Administered 2015-09-25: 20 mL
  Filled 2015-09-23 (×3): qty 40

## 2015-09-23 MED ORDER — METOPROLOL TARTRATE 50 MG PO TABS
50.0000 mg | ORAL_TABLET | Freq: Two times a day (BID) | ORAL | Status: DC
  Administered 2015-09-23 – 2015-09-25 (×6): 50 mg via ORAL
  Filled 2015-09-23 (×2): qty 1
  Filled 2015-09-23: qty 2
  Filled 2015-09-23 (×3): qty 1

## 2015-09-23 MED ORDER — ONDANSETRON HCL 4 MG/2ML IJ SOLN: 4.0000 mg | Freq: Four times a day (QID) | INTRAMUSCULAR | Status: DC | PRN

## 2015-09-23 MED ORDER — ONDANSETRON HCL 4 MG PO TABS: 4.0000 mg | ORAL_TABLET | Freq: Four times a day (QID) | ORAL | Status: DC | PRN

## 2015-09-23 MED ORDER — HEPARIN SODIUM (PORCINE) 5000 UNIT/ML IJ SOLN
5000.0000 [IU] | Freq: Three times a day (TID) | INTRAMUSCULAR | Status: DC
  Administered 2015-09-23 – 2015-09-25 (×8): 5000 [IU] via SUBCUTANEOUS
  Filled 2015-09-23 (×8): qty 1

## 2015-09-23 MED ORDER — ROSUVASTATIN CALCIUM 20 MG PO TABS
20.0000 mg | ORAL_TABLET | Freq: Every day | ORAL | Status: DC
  Administered 2015-09-23 – 2015-09-24 (×2): 20 mg via ORAL
  Filled 2015-09-23 (×2): qty 1

## 2015-09-23 MED ORDER — INSULIN ASPART 100 UNIT/ML ~~LOC~~ SOLN
0.0000 [IU] | Freq: Three times a day (TID) | SUBCUTANEOUS | Status: DC
  Administered 2015-09-24: 2 [IU] via SUBCUTANEOUS
  Administered 2015-09-25: 5 [IU] via SUBCUTANEOUS

## 2015-09-23 MED ORDER — OXYCODONE-ACETAMINOPHEN 5-325 MG PO TABS
2.0000 | ORAL_TABLET | Freq: Four times a day (QID) | ORAL | Status: DC | PRN
  Administered 2015-09-23 – 2015-09-25 (×4): 2 via ORAL
  Filled 2015-09-23 (×4): qty 2

## 2015-09-23 MED ORDER — ACETAMINOPHEN 325 MG PO TABS: 650.0000 mg | ORAL_TABLET | ORAL | Status: DC | PRN

## 2015-09-23 MED ORDER — SODIUM CHLORIDE 0.9 % IJ SOLN
3.0000 mL | Freq: Two times a day (BID) | INTRAMUSCULAR | Status: DC
  Administered 2015-09-23 (×2): 3 mL via INTRAVENOUS

## 2015-09-23 MED ORDER — OXYBUTYNIN CHLORIDE 5 MG PO TABS: 5.0000 mg | ORAL_TABLET | Freq: Every day | ORAL | Status: DC | PRN

## 2015-09-23 NOTE — H&P (Signed)
[facility] History and Physical  [name] [id number] DOB: [birth year] DOA: 09/22/2015  Referring physician: ED physician PCP: Irven Shelling, MD  Specialists:   Chief Complaint: Left knee pain and right hand pain after fall  HPI: [name] is a 70 y.o. male with PMH of DM type II, HTN, PVD, chronic indwelling foley catheter, diabetes mellitus, diastolic congestive heart failure, DVT, stroke, chronic back pain, CAD, who presents with left knee and the right hand pain after fall.  Pt reports that he had fall when he was trying to change the bag in a garbage, and lost his balance today. He fell onto his right side. He developed pain over left knee and the right hand. He also had a head injury, but no LOC. He has some pain in occipital region/upper neck.  He was recent hospitalized and treated for UTI due to extended-spectrum beta lactamase (ESBL)-producing Klebsiella. He initially went to a skilled nursing facility but reports that he was discharged back to his residence 4 days ago. He was supposed to complete a course of ertapenem through right arm PICC up until today. He says he has not had any abx since he has been home though. Patient does not have fever, chills, symptoms of UTI. Patient does not have chest pain, shortness of breath, abdominal pain, diarrhea.  At baseline he describes poor mobility with only able to ambulate short distances with a walker. EMS reports that home in very poor upkeep. Trash everywhere to point that difficult to get into certain areas of home. Containers are full of urine and general disarray.   In ED, patient was found to have negative urinalysis, CK 63, WBC 8.7, temperature normal, no tachycardia, electrolytes and renal function okay. CT head and C-spine negative for acute abnormalities. X-ray of the left hip/pelvis, left knee and right hand are all negative for acute bony abnormalities. Patient's and admitted to inpatient for  observation.  EKG: Not done in ED, will get one.   Where does patient live?   At home   Can patient participate in ADLs?  Barely   Review of Systems:   General: no fevers, chills, no changes in body weight, has fatigue HEENT: no blurry vision, hearing changes or sore throat Pulm: no dyspnea, coughing, wheezing CV: no chest pain, palpitations Abd: no nausea, vomiting, abdominal pain, diarrhea, constipation GU: no dysuria, burning on urination, increased urinary frequency, hematuria  Ext: mild leg edema Neuro: no unilateral weakness, numbness, or tingling, no vision change or hearing loss Skin: no rash MSK: has pain over left knee and right hand Heme: No easy bruising.  Travel history: No recent long distant travel.  Allergy:  Allergies  Allergen Reactions  . Ace Inhibitors Swelling    Pt tolerates lisinopril  . Lipitor [Atorvastatin Calcium] Swelling  . Metformin And Related Swelling  . Cefadroxil Hives  . Cephalexin Hives  . Morphine And Related Other (See Comments)    Sweating, feels like is "in rocky boat."  . Robaxin [Methocarbamol] Other (See Comments)    Feels like he is shaky    Past Medical History  Diagnosis Date  . Obese   . Hypertension   . Diabetic neuropathy, painful ([facility])   . Chronic indwelling Foley catheter   . Bladder spasms   . Hyperlipidemia   . GERD (gastroesophageal reflux disease)   . Neurogenic bladder   . CAD (coronary artery disease)   . PONV (postoperative nausea and vomiting)   . CHF (congestive heart failure) (  [place])   . DVT (deep venous thrombosis) (H[facility]) 1980's    LLE  . Type II diabetes mellitus ([place])   . Diabetic diarrhea ([place])   . Pneumonia 1999  . OSA (obstructive sleep apnea)     "they wanted me to wear a mask; I couldn't" (11/10/2014)  . Arthritis     "hands, ankles, knees" (11/10/2014)  . Chronic lower back pain   . Stroke [facility]) 10/2011; 06/2014    right hand numbness/notes 10/20/2011, pt not sure he had stroke in 2013; "right  hand weaker and right face not quite right since" (11/10/2014)    Past Surgical History  Procedure Laterality Date  . Gastroplasty    . Total knee arthroplasty Right 1990's  . Left heart catheterization with coronary angiogram N/A 10/24/2011    Procedure: LEFT HEART CATHETERIZATION WITH CORONARY ANGIOGRAM;  Surgeon: Candee Furbish, MD;  Location: [facility] CATH LAB;  Service: Cardiovascular;  Laterality: N/A;  right radial artery approach  . Cholecystectomy open  1970's?  . Joint replacement    . Carpal tunnel release Bilateral ~ 2003-2004  . Cardiac catheterization N/A 05/02/2015    Procedure: Left Heart Cath and Coronary Angiography;  Surgeon: Lorretta Harp, MD;  Location: [place] CV LAB;  Service: Cardiovascular;  Laterality: N/A;  . Cardiac catheterization N/A 05/04/2015    Procedure: Left Heart Cath and Coronary Angiography;  Surgeon: Wellington Hampshire, MD;  Location: [place] CV LAB;  Service: Cardiovascular;  Laterality: N/A;    Social History:  reports that he quit smoking about 39 years ago. His smoking use included Cigarettes. He has a 40 pack-year smoking history. He has never used smokeless tobacco. He reports that he does not drink alcohol or use illicit drugs.  Family History:  Family History  Problem Relation Age of Onset  . Hypertension Mother   . Diabetes type I Father   . Cancer Brother      Testicular Cancer  . Cancer Brother     Leukemia     Prior to Admission medications   Medication Sig Start Date End Date Taking? Authorizing Provider  aspirin EC 81 MG tablet Take 81 mg by mouth every morning.    Yes Historical Provider, MD  diphenoxylate-atropine (LOMOTIL) 2.5-0.025 MG tablet Take 1 tablet by mouth 3 (three) times daily as needed for diarrhea or loose stools.  06/14/15  Yes Historical Provider, MD  ezetimibe (ZETIA) 10 MG tablet Take 1 tablet (10 mg total) by mouth daily. 05/06/15  Yes Rhonda G Barrett, PA-C  fexofenadine (ALLEGRA) 180 MG tablet Take 180 mg by mouth  daily as needed for allergies or rhinitis.   Yes Historical Provider, MD  furosemide (LASIX) 40 MG tablet Take 1 tablet (40 mg total) by mouth daily. 04/03/14  Yes Lavone Orn, MD  insulin NPH Human (HUMULIN N) 100 UNIT/ML injection Inject 0.28 mLs (28 Units total) into the skin 2 (two) times daily at 8 am and 10 pm. Patient taking differently: Inject 20-30 Units into the skin 2 (two) times daily at 8 am and 10 pm.  09/12/15  Yes Nishant Dhungel, MD  isosorbide mononitrate (IMDUR) 60 MG 24 hr tablet Take 1 tablet (60 mg total) by mouth daily. 07/06/15  Yes Hosie Poisson, MD  lisinopril (PRINIVIL,ZESTRIL) 5 MG tablet Take 5 mg by mouth daily. 06/23/15  Yes Historical Provider, MD  nitroGLYCERIN (NITROSTAT) 0.4 MG SL tablet Place 1 tablet (0.4 mg total) under the tongue every 5 (five) minutes as needed for chest pain.  12/08/14  Yes Luke K Kilroy, PA-C  omeprazole (PRILOSEC) 20 MG capsule Take 20 mg by mouth daily.   Yes Historical Provider, MD  oxybutynin (DITROPAN) 5 MG tablet Take 5-15 mg by mouth daily as needed for bladder spasms.    Yes Historical Provider, MD  senna-docusate (SENOKOT-S) 8.6-50 MG per tablet Take 2 tablets by mouth daily. Patient taking differently: Take 2 tablets by mouth 2 (two) times daily as needed for moderate constipation.  05/11/15  Yes Carmin Muskrat, MD  traMADol (ULTRAM) 50 MG tablet Take 50-100 mg by mouth 2 (two) times daily as needed for moderate pain.  09/21/15  Yes Historical Provider, MD  acetaminophen (TYLENOL) 325 MG tablet Take 2 tablets (650 mg total) by mouth every 4 (four) hours as needed for headache or mild pain. 12/08/14   Erlene Quan, PA-C  Chlorhexidine Gluconate Cloth 2 % PADS Apply 6 each topically daily at 6 (six) AM. Patient not taking: Reported on 09/22/2015 09/12/15   Nishant Dhungel, MD  magnesium oxide (MAG-OX) 400 (241.3 Mg) MG tablet Take 1 tablet (400 mg total) by mouth daily. 09/15/15   Simbiso Ranga, MD  metoprolol (LOPRESSOR) 50 MG tablet Take 50 mg  by mouth 2 (two) times daily. 09/21/15   Historical Provider, MD  rosuvastatin (CRESTOR) 20 MG tablet Take 1 tablet (20 mg total) by mouth daily at 6 PM. Patient not taking: Reported on 09/22/2015 07/06/15   Hosie Poisson, MD    Physical Exam: Filed Vitals:   09/23/15 0233 09/23/15 0401 09/23/15 0426 09/23/15 0429  BP:  133/74  116/52  Pulse:    67  Temp:  97.9 F (36.6 C)  97.5 F (36.4 C)  TempSrc:  Oral  Oral  Resp: 17 18  16   Height:   6\' 1"  (1.854 m)   Weight:   153.588 kg (338 lb 9.6 oz)   SpO2: 97% 98%  100%   General: Not in acute distress HEENT:       Eyes: PERRL, EOMI, no scleral icterus.       ENT: No discharge from the ears and nose, no pharynx injection, no tonsillar enlargement.        Neck: No JVD, no bruit, no mass felt. Heme: No neck lymph node enlargement. Cardiac: S1/S2, RRR, No murmurs, No gallops or rubs. Pulm:  No rales, wheezing, rhonchi or rubs. Abd: Soft, nondistended, nontender, no rebound pain, no organomegaly, BS present. Ext: trace leg edema bilaterally. Has venous insufficiency changes in legs. 2+DP/PT pulse bilaterally. Has PICC line in the right arm without signs of infection Musculoskeletal: Has tenderness over left knee right hand, no deformity. Skin: No rashes.  Neuro: Alert, oriented X3, cranial nerves II-XII grossly intact, muscle strength 5/5 in all extremities, sensation to light touch intact.  Psych: Patient is not psychotic, no suicidal or hemocidal ideation.  Labs on Admission:  Basic Metabolic Panel:  Recent Labs Lab 09/22/15 1332  NA 134*  K 4.4  CL 98*  CO2 26  GLUCOSE 259*  BUN 11  CREATININE 0.64  CALCIUM 9.0   Liver Function Tests: No results for input(s): AST, ALT, ALKPHOS, BILITOT, PROT, ALBUMIN in the last 168 hours. No results for input(s): LIPASE, AMYLASE in the last 168 hours. No results for input(s): AMMONIA in the last 168 hours. CBC:  Recent Labs Lab 09/22/15 1332  WBC 8.7  NEUTROABS 5.8  HGB 13.5  HCT  40.8  MCV 91.3  PLT 205   Cardiac Enzymes:  Recent Labs Lab  09/22/15 1332  CKTOTAL 63    BNP (last 3 results)  Recent Labs  12/22/14 2330 07/02/15 1439  BNP 120.1* 320.3*    ProBNP (last 3 results) No results for input(s): PROBNP in the last 8760 hours.  CBG: No results for input(s): GLUCAP in the last 168 hours.  Radiological Exams on Admission: Ct Head Wo Contrast  09/22/2015  CLINICAL DATA:  Status post fall. Loss balance. Occurred 2 days ago. EXAM: CT HEAD WITHOUT CONTRAST CT CERVICAL SPINE WITHOUT CONTRAST TECHNIQUE: Multidetector CT imaging of the head and cervical spine was performed following the standard protocol without intravenous contrast. Multiplanar CT image reconstructions of the cervical spine were also generated. COMPARISON:  04/14/2015 FINDINGS: CT HEAD FINDINGS There is no evidence of mass effect, midline shift, or extra-axial fluid collections. There is no evidence of a space-occupying lesion or intracranial hemorrhage. There is no evidence of a cortical-based area of acute infarction. There are calcifications in bilateral base a ganglia and thalamus. There is generalized cerebral atrophy. There is periventricular white matter low attenuation likely secondary to microangiopathy. The ventricles and sulci are appropriate for the patient's age. The basal cisterns are patent. Visualized portions of the orbits are unremarkable. The visualized portions of the paranasal sinuses and mastoid air cells are unremarkable. Cerebrovascular atherosclerotic calcifications are noted. The osseous structures are unremarkable. CT CERVICAL SPINE FINDINGS The alignment is anatomic. The vertebral body heights are maintained. There is no acute fracture. There is no static listhesis. The prevertebral soft tissues are normal. The intraspinal soft tissues are not fully imaged on this examination due to poor soft tissue contrast, but there is no gross soft tissue abnormality. The disc spaces are  maintained. There are mild right uncovertebral degenerative changes at C3-4. The visualized portions of the lung apices demonstrate no focal abnormality. There is bilateral carotid artery atherosclerosis. IMPRESSION: 1. No acute intracranial pathology. 2. No acute osseous injury of the cervical spine. Electronically Signed   By: Kathreen Devoid   On: 09/22/2015 13:14   Ct Cervical Spine Wo Contrast  09/22/2015  CLINICAL DATA:  Status post fall. Loss balance. Occurred 2 days ago. EXAM: CT HEAD WITHOUT CONTRAST CT CERVICAL SPINE WITHOUT CONTRAST TECHNIQUE: Multidetector CT imaging of the head and cervical spine was performed following the standard protocol without intravenous contrast. Multiplanar CT image reconstructions of the cervical spine were also generated. COMPARISON:  04/14/2015 FINDINGS: CT HEAD FINDINGS There is no evidence of mass effect, midline shift, or extra-axial fluid collections. There is no evidence of a space-occupying lesion or intracranial hemorrhage. There is no evidence of a cortical-based area of acute infarction. There are calcifications in bilateral base a ganglia and thalamus. There is generalized cerebral atrophy. There is periventricular white matter low attenuation likely secondary to microangiopathy. The ventricles and sulci are appropriate for the patient's age. The basal cisterns are patent. Visualized portions of the orbits are unremarkable. The visualized portions of the paranasal sinuses and mastoid air cells are unremarkable. Cerebrovascular atherosclerotic calcifications are noted. The osseous structures are unremarkable. CT CERVICAL SPINE FINDINGS The alignment is anatomic. The vertebral body heights are maintained. There is no acute fracture. There is no static listhesis. The prevertebral soft tissues are normal. The intraspinal soft tissues are not fully imaged on this examination due to poor soft tissue contrast, but there is no gross soft tissue abnormality. The disc spaces  are maintained. There are mild right uncovertebral degenerative changes at C3-4. The visualized portions of the lung apices demonstrate no focal  abnormality. There is bilateral carotid artery atherosclerosis. IMPRESSION: 1. No acute intracranial pathology. 2. No acute osseous injury of the cervical spine. Electronically Signed   By: Kathreen Devoid   On: 09/22/2015 13:14   Ct Knee Left Wo Contrast  09/22/2015  CLINICAL DATA:  [place] 2 days ago.  Left knee pain. EXAM: CT OF THE LEFT KNEE WITHOUT CONTRAST TECHNIQUE: Multidetector CT imaging of the left knee was performed according to the standard protocol. Multiplanar CT image reconstructions were also generated. COMPARISON:  Radiographs 09/22/2015 FINDINGS: There are moderate to advanced tricompartmental degenerative changes most significant in the lateral compartment. There is joint space narrowing, osteophytic spurring and subchondral cystic change. No acute fracture or osteochondral lesion. There is a moderate-sized joint effusion. Moderate to advanced fatty atrophy of the lower thigh and calf musculature. IMPRESSION: 1. Tricompartmental degenerative changes. 2. No acute fracture. 3. Moderate-sized joint effusion. 4. Advanced fatty atrophy of the lower thigh and upper calf musculature. Electronically Signed   By: Marijo Sanes M.D.   On: 09/22/2015 15:46   Dg Knee Complete 4 Views Left  09/22/2015  CLINICAL DATA:  Status post fall.  Left hip pain. EXAM: LEFT KNEE - COMPLETE 4+ VIEW COMPARISON:  None. FINDINGS: Generalized osteopenia. No fracture or dislocation. Tricompartmental osteoarthritis of the left knee most severe in the lateral femorotibial compartment. There is no aggressive lytic or sclerotic osseous lesion. IMPRESSION: No acute osseous injury of the left knee. Electronically Signed   By: Kathreen Devoid   On: 09/22/2015 13:16   Dg Hand Complete Right  09/22/2015  CLINICAL DATA:  Pain following fall 2 days prior EXAM: RIGHT HAND - COMPLETE 3+ VIEW  COMPARISON:  None. FINDINGS: Frontal, oblique, and lateral views were obtained. There is no demonstrable fracture or dislocation. There is marked narrowing of the radiocarpal joint. There is milder narrowing in the scaphotrapezial and first carpal -metacarpal joints. There is also moderate narrowing in the first MCP joint. No erosive change. There are scattered foci of arterial vascular calcification, primarily in the radial artery. IMPRESSION: Multifocal osteoarthritic change, most marked in the radiocarpal joint. Radial artery vascular calcification. No acute fracture or dislocation. Electronically Signed   By: Lowella Grip III M.D.   On: 09/22/2015 13:20   Dg Hip Unilat With Pelvis 2-3 Views Left  09/22/2015  CLINICAL DATA:  Pain following fall 2 days prior EXAM: DG HIP (WITH OR WITHOUT PELVIS) 2-3V LEFT COMPARISON:  None. FINDINGS: Frontal pelvis as well as frontal and lateral left hip images were obtained. There is no fracture or dislocation. There is advanced osteoarthritic change in the right hip joint with marked loss of joint space on the right and multiple subchondral cystic changes in the right femoral head. There is slight narrowing of the left hip joint. No erosive changes. IMPRESSION: No fracture or dislocation. Advanced osteoarthritic change right hip joint. Slight narrowing left hip joint. Electronically Signed   By: Lowella Grip III M.D.   On: 09/22/2015 13:17    Assessment/Plan Principal Problem:   Left knee pain Active Problems:   Diabetic neuropathy (HCC)   CVA (cerebral infarction)   Chronic indwelling Foley catheter   Chronic diastolic CHF (congestive heart failure) (HCC)   Diabetes type 2, uncontrolled (HCC)   History of DVT (deep vein thrombosis)   Neurogenic bladder   Hyperlipidemia LDL goal <70   Type II diabetes mellitus with peripheral circulatory disorder, uncontrolled ([place])   Morbid obesity (HCC)   CAD in native artery   Inability  to ambulate due to knee    Left hip pain  Left knee pain and R hand pain: no acute bony abnormalities on X-ray. Due to deconditioning, patient cannot take care of himself at home. Need placement -will admit for observation. -Pain control: When necessary Percocet -Consult social worker for possible SNF placement  Hx of UTI due to chronic indwelling Foley catheter: pt did not did the full course of ertapenem through right arm PICC,but his urinalysis is negative today, he is asymptomatic. -Follow-up urine culture  Chronic diastolic CHF (congestive heart failure) ([place]): 2-D echo on 07/04/15 showed EF 55-60 percent with grade 1 diastolic dysfunction. Patient has a trace amount of leg edema, CHF is compensated. -Continue Lasix, aspirin and metoprolol -check BNP  DM-II: Last A1c 10.4, poorly controled. Patient is taking Humulin at home -Continue home home in 28 units twice a day  HLD: Last LDL was 106 on 04/30/15 -Continue home medications: Crestor, Zetia   DVT ppx: SQ Heparin  Code Status: Full code Family Communication: None at bed side.  Disposition Plan: Admit to inpatient   Date of Service 09/23/2015    Ivor Costa [facility] Pager [phone]  If 7PM-7AM, please contact night-coverage [email or web address] Password [email or web address] 09/23/2015, 5:13 AM

## 2015-09-23 NOTE — Progress Notes (Signed)
Nutrition Brief Note  Patient identified on Braden score report.  Wt Readings from Last 15 Encounters:  09/23/15 338 lb 9.6 oz (153.588 kg)  09/15/15 354 lb 4.8 oz (160.709 kg)  07/06/15 350 lb 8 oz (158.986 kg)  06/07/15 345 lb (156.491 kg)  05/11/15 330 lb (149.687 kg)  05/10/15 329 lb (149.233 kg)  05/09/15 329 lb (149.233 kg)  05/06/15 344 lb 11.2 oz (156.355 kg)  04/19/15 350 lb (158.759 kg)  04/18/15 346 lb 6.4 oz (157.126 kg)  12/22/14 345 lb (156.491 kg)  12/09/14 352 lb 8 oz (159.893 kg)  11/19/14 341 lb 12.8 oz (155.039 kg)  11/11/14 339 lb 1.1 oz (153.8 kg)  09/23/14 351 lb (159.213 kg)    Body mass index is 44.68 kg/(m^2). Patient meets criteria for morbid obesity based on current BMI.   Weight has been fluctuating over the past 1 year. Per notes, pt with hx of Type 2 DM; CBGs currently 102-195 mg/dL. Notes indicate that [name] and Case Manager report possible placement for pt tomorrow (1/14).  Current diet order is Heart Healthy, patient is consuming approximately 100% of meals at this time. Labs and medications reviewed.   No nutrition interventions warranted at this time. If nutrition issues arise, please consult RD.      Jarome Matin, RD, LDN Inpatient Clinical Dietitian Pager # [phone] After hours/weekend pager # [phone]

## 2015-09-23 NOTE — Progress Notes (Addendum)
EDCM spoke with SW assistant director regarding patient.  Patient has exhausted Medicare bed days, does not qualify for LOG and patient refuses to sign over his check so will not be able to go to SNF.  Disposition will be home upon discharge.  EDCM spoke to patient at bedside who is accepting of this.  Will send message to am weekend CM to ensure patient has home health services and all the equipment he needs.  Per chart review, [facility] has received a referral for RN, PT, OT aide.  [facility] is also following patient.  This EDCM will fax order for hospital bed to [place] per patient request.  [facility] suggested patient call his friend Gerald Stabs to make arrangements for him to be at patient's home for assistance if needed.  Patient verbalized understanding.  [facility] discussed patient with unit RN Alyse Low regarding plan for PICC line.  PICC line will be addressed in am per [facility], will pass on to day shift RN.  No further EDCM needs at this time.  PLEASE CHECK AMION IN AM OR CALL AC FOR [place] CASE MANAGER FOR DISCHARGE NEEDS TOMORROW.  Rhame YOU  09/23/2015 2024pm [facility] faxed order for hospital bed to [facility] with confirmation of receipt.

## 2015-09-23 NOTE — Progress Notes (Signed)
Spoke with Roylene Reason, RN from [facility] [phone], concerning pt. [name] states, "Pt is unable to care for himself, [facility] found pt in his own urine and urine bottle all around the room."  Will continue to follow pt.

## 2015-09-23 NOTE — Consult Note (Signed)
   [facility] CM Inpatient Consult   09/23/2015  [name] [birth year] [id number]   Received notification from ED RNCM that patient was in the hospital. Patient has been followed by [facility]. Please see chart review tab then notes for further communication with patient from [facility] staff. Went to bedside to speak with patient. He endorses that he ran out of his SNF days and that he even stopped taking his iv antibiotics because " I just could not simply afford them". He states he has limited support and the family he has left lives in a different state. Inquired about him applying for Medicaid. He states he does not think he will qualify for Medicaid. He endorses he is active with [facility] as well. Made him aware that [facility] will continue to follow. Will collaborate with inpatient staff as well as [facility] staff about patient. Will continue to follow. Left contact information at bedside.   [name], MSN-Ed, RN,BSN [facility] Liaison [phone]

## 2015-09-23 NOTE — Progress Notes (Signed)
Patient seen and examined, reported left knee popped when worked with physical therapy today, c/o left knee pain and edema, repeat left knee x  Ray and ordered venous US left LE.

## 2015-09-23 NOTE — Patient Outreach (Signed)
[name]) Care Management  09/23/2015  [name] 22-Feb-1946 [id number]  After thorough review of patient's EMR (Electronic Medical Record) in [facility], [name] noted that patient was readmitted into the hospital [name], shortly after having spoken with [name] yesterday morning (Thursday, September 21, 2014).  According to EMR, patient took a bad fall several days prior, but neglected to report it to the home health agency currently involved with his care, or his primary caregiver, [name]. [name] was able to make contact with [name], [facility] with [place] [facility], to discuss patient's care, as well as plan of care for most recent hospital admission.  It has been determined that patient is not safe to return home to live alone, as this is patient's 6th emergency department visit/hospital admission in 6 months.  [name] has not even had an opportunity to engage with patient to perform transition of care, as patient is readmitted into the hospital, discharged to a skilled nursing facility and only home for a short period of time before the cycle is repeated. [name] has made contact with the [facility] Social Worker at [facility], [name] to request that a referral be made to (APS) Adult Protective Services with the [place].  It is also questionable as to whether or not an APS referral needs to be made against [facility], [place] where patient was residing prior to returning home.   [name] will continue to converse with [name] and [name] with [place] [facility], to obtain updated information on patient.  [name] has agreed to assist [name] with discharge planning arrangements for patient.  [name] will fax a correspondence letter to patient's Primary Care Physician, [name] to ensure that [name] is  aware of [name]'s involvement with patient's care.  [name], BSW, MSW, LCSW  Licensed Education officer, [facility]  Mailing [place] [address] [place], [place] [address] Physical Address-[address], [place], [place] [address] Toll Free Main # [phone] Fax # [phone] Cell # [phone]  Fax # [phone]  [name]

## 2015-09-23 NOTE — Progress Notes (Signed)
[name] will take pt back for service related to his safety, [place] and [place] will not take pt back related to pt being unsafe alone. [place] asked that APS be called.

## 2015-09-23 NOTE — Clinical Social Work Placement (Signed)
[name] awaiting direction from [place] re: [facility] as patient has exhausted his Medicare days at [employer].     [name], [facility] Clinical Social Worker cell #: [phone]    CLINICAL SOCIAL WORK PLACEMENT  NOTE  Date:  09/23/2015  Patient Details  Name: [name] MRN: [id number] Date of Birth: Feb 07, 1946  Clinical Social Work is seeking post-discharge placement for this patient at the [place] level of care (*[name] will initial, date and re-position this form in  chart as items are completed):  Yes   Patient/family provided with [place] Work Department's list of facilities offering this level of care within the geographic area requested by the patient (or if unable, by the patient's family).  Yes   Patient/family informed of their freedom to choose among providers that offer the needed level of care, that participate in Medicare, Medicaid or managed care program needed by the patient, have an available bed and are willing to accept the patient.  Yes   Patient/family informed of [facility]'s ownership interest in [facility] and [facility], as well as of the fact that they are under no obligation to receive care at these facilities.  PASRR submitted to EDS on       PASRR number received on       Existing PASRR number confirmed on 09/23/15     FL2 transmitted to all facilities in geographic area requested by pt/family on 09/23/15     FL2 transmitted to all facilities within larger geographic area on       Patient informed that his/her managed care company has contracts with or will negotiate with certain facilities, including the following:            Patient/family informed of bed offers received.  Patient chooses bed at       Physician recommends and patient chooses bed at      Patient to be transferred to   on  .  Patient to be transferred to facility by        Patient family notified on   of transfer.  Name of family member notified:        PHYSICIAN       Additional Comment:    _______________________________________________ Standley Brooking, LCSW 09/23/2015, 4:52 PM

## 2015-09-23 NOTE — ED Provider Notes (Signed)
Patient accepted in sign out pending SW and care management consultation.  Patient required multiple doses of IV medication for pain control.  Patient unable to ambulate.  Ultimately SW and care management said patient possible placement tomorrow.  Discussed with Dr. Blaine Hamper who agreed with admission for observation for pain control and for possible placement tomorrow.  Care management and social work to continue to help management care.  Harvel Quale, MD 09/23/15 [phone]

## 2015-09-23 NOTE — Evaluation (Signed)
Occupational Therapy Evaluation Patient Details Name: [name] MRN: [id number] DOB: October 24, 1945 Today's Date: 09/23/2015    History of Present Illness 70 y.o. male with PMH of DM type II, HTN, PVD, chronic indwelling foley catheter, diabetes mellitus, diastolic congestive heart failure, DVT, stroke, chronic back pain, CAD, who presents with left knee and the right hand pain after fall at home.  Imaging negative for acute bony injuries   Clinical Impression   Pt was admitted for a fall resulting in L knee pain and R hand pain.  Did not stand him this session as he had syncopal episode and L knee buckle with PT. Will follow in acute setting.  ADLs modified for sit to lean and lateral scoot transfers for increased safety.  Overall min A x 1-2 person assist.  Anticipate pt will need SNF as he lives alone    Follow Up Recommendations  SNF (pt does not have 24/7)    Equipment Recommendations  None recommended by OT    Recommendations for Other Services       Precautions / Restrictions Precautions Precautions: Fall Precaution Comments: pt appears to have brief syncopal episodes that he is not aware of Restrictions Weight Bearing Restrictions: No      Mobility Bed Mobility Overal bed mobility: Needs Assistance Bed Mobility: Supine to Sit;Sit to Supine     Supine to sit: Supervision;HOB elevated Sit to supine: Supervision   General bed mobility comments: used bedrails  Transfers Overall transfer level: Needs assistance Equipment used: Rolling walker (2 wheeled) Transfers: Sit to/from Stand Sit to Stand: Mod assist;+2 physical assistance;From elevated surface;Max assist         General transfer comment: Pt needed mod to max +2 assist with PT:  knee buckled and syncopal episode    Balance Overall balance assessment: Needs assistance;History of Falls         Standing balance support: Bilateral upper extremity supported;During functional activity Standing  balance-Leahy Scale: Poor                              ADL Overall ADL's : Needs assistance/impaired     Grooming: Set up;Sitting   Upper Body Bathing: Sitting;Minimal assitance   Lower Body Bathing: Maximal assistance;+2 for physical assistance;+2 for safety/equipment;Sit to/from stand (for sit to stand; min A for bathing)   Upper Body Dressing : Minimal assistance;Sitting   Lower Body Dressing: Maximal assistance;+2 for physical assistance;+2 for safety/equipment;With adaptive equipment (for sit to stand; mod A for dressing)                 General ADL Comments: has sock aide and reacher, but reacher is not in good shape.  Did not stand--had knee buckle with PT.  Also had one episode for about 10 seconds when pt did not respond to OT--sitting EOB.  Happened with PT also.  Pt was working biceps with 5# weights. He does not want theraband:  feels it cuts into his hands     Vision     Perception     Praxis      Pertinent Vitals/Pain Pain Assessment: Faces Faces Pain Scale: Hurts little more (non weight bearing at EOB) Pain Location: R hand, R hip, L knee Pain Descriptors / Indicators: Sore Pain Intervention(s): Limited activity within patient's tolerance;Monitored during session;Premedicated before session;Repositioned     Hand Dominance     Extremity/Trunk Assessment Upper Extremity Assessment Upper Extremity Assessment: RUE deficits/detail;LUE deficits/detail LUE  Deficits / Details: L shoulder painful when lifting above 45 degrees      Cervical / Trunk Assessment Cervical / Trunk Assessment: Normal   Communication Communication Communication: No difficulties   Cognition Arousal/Alertness: Awake/alert Behavior During Therapy: WFL for tasks assessed/performed Overall Cognitive Status: Within Functional Limits for tasks assessed                     General Comments       Exercises       Shoulder Instructions      Home Living  Family/patient expects to be discharged to:: Private residence Living Arrangements: Alone Available Help at Discharge: Available PRN/intermittently;Personal care attendant Type of Home: House Home Access: Ramped entrance     Home Layout: One level     Bathroom Shower/Tub: Teacher, early years/pre: Handicapped height     Home Equipment: [employer] - single point;Wheelchair - [employer] - 2 wheels;Walker - standard;Bedside commode (sponge bathes)   Additional Comments: aide just started coming:  only RN came for evaluation      Prior Functioning/Environment Level of Independence: Needs assistance  Gait / Transfers Assistance Needed: walking with RW           OT Diagnosis: Generalized weakness;Acute pain   OT Problem List: Decreased strength;Decreased activity tolerance;Decreased knowledge of use of DME or AE;Pain;Impaired UE functional use;Decreased range of motion   OT Treatment/Interventions: Self-care/ADL training;DME and/or AE instruction;Patient/family education;Therapeutic activities    OT Goals(Current goals can be found in the care plan section) Acute Rehab OT Goals Patient Stated Goal: get strength back; be able to take care of himself OT Goal Formulation: With patient Time For Goal Achievement: 09/30/15 Potential to Achieve Goals: Fair ADL Goals Pt Will Perform Lower Body Bathing: with min assist;with adaptive equipment;sitting/lateral leans Pt Will Perform Lower Body Dressing: with min assist;with adaptive equipment;sitting/lateral leans Pt Will Transfer to Toilet: with min assist;with +2 assist;bedside commode (lateral scoot)  OT Frequency: Min 2X/week   Barriers to D/C:            Co-evaluation              End of Session    Activity Tolerance: Patient tolerated treatment well Patient left: in bed;with call bell/phone within reach;with bed alarm set   Time: 1432-1455 OT Time Calculation (min): 23 min Charges:  OT General Charges $OT  Visit: 1 Procedure OT Evaluation $OT Eval Moderate Complexity: 1 Procedure G-Codes: OT G-codes **NOT FOR INPATIENT CLASS** Functional Assessment Tool Used: clinical observation and judgment Functional Limitation: Self care Self Care Current Status [id number]): At least 60 percent but less than 80 percent impaired, limited or restricted Self Care Goal Status [id number]): At least 20 percent but less than 40 percent impaired, limited or restricted  Othello Dickenson 09/23/2015, 4:23 PM  Lesle [name], OTR/L [phone] 09/23/2015

## 2015-09-23 NOTE — Progress Notes (Signed)
[name] & RNCM, Cookie spoke with patient re: discharge planning. Patient has exhausted his Medicare days at [facility], though refuses to pay privately or sign over his check for SNF - states he has too many bills to pay. Awaiting direction from [place], Edwyna Ready.    [name], [facility] Clinical Social Worker cell #: [phone]

## 2015-09-23 NOTE — NC FL2 (Signed)
[place] LEVEL OF CARE SCREENING TOOL     IDENTIFICATION  Patient Name: [name] Birthdate: [birth year] Sex: male Admission Date (Current Location): 09/22/2015  [facility] and [place] Number:  Herbalist and Address:  [facility],  [place] [address], [place]      Provider Number: [id number]  Attending Physician Name and Address:  [name], MD  Relative Name and Phone Number:       Current Level of Care: Hospital Recommended Level of Care: [place] Prior Approval Number:    Date Approved/Denied:   PASRR Number: [id number]  Discharge Plan: SNF    Current Diagnoses: Patient Active Problem List   Diagnosis Date Noted  . Left knee pain 09/23/2015  . Inability to ambulate due to knee 09/23/2015  . Left hip pain 09/23/2015  . CAD in native artery 09/15/2015  . PVC's (premature ventricular contractions) 09/15/2015  . Sinus pause 09/14/2015  . Hypomagnesemia 09/14/2015  . Urinary tract infection due to extended-spectrum beta lactamase (ESBL)-producing Klebsiella 09/12/2015  . Candidiasis, intertriginous 09/12/2015  . Morbid obesity ([name]) 07/03/2015  . Type II diabetes mellitus with peripheral circulatory disorder, uncontrolled ([name]) 05/09/2015  . Hyperlipidemia LDL goal <70 05/02/2015  . Abnormal nuclear stress test 05/02/2015  . Neurogenic bladder 04/26/2015  . Pressure ulcer 04/15/2015  . UTI (urinary tract infection) 04/14/2015  . History of DVT (deep vein thrombosis) 12/08/2014  . Right-sided abdominal pain of unknown cause   . Diabetes type 2, uncontrolled ([name])   . Chronic diastolic CHF (congestive heart failure) ([place]) 04/01/2014  . Chest pain with moderate risk of acute coronary syndrome 12/23/2013  . Chronic indwelling Foley catheter 12/23/2013  . CVA (cerebral infarction) 03/11/2012  . NSVT (nonsustained ventricular tachycardia) ([name]) 10/26/2011  . Diabetic neuropathy ([name]) 10/10/2011  . OSA  (obstructive sleep apnea) 10/10/2011    Orientation RESPIRATION BLADDER Height & Weight    Self, Time, Situation, Place  Normal Continent 6\' 1"  (185.4 cm) 338 lbs.  BEHAVIORAL SYMPTOMS/MOOD NEUROLOGICAL BOWEL NUTRITION STATUS      Incontinent  (Heart )  AMBULATORY STATUS COMMUNICATION OF NEEDS Skin   Extensive Assist Verbally Other (Comment), PU Stage and Appropriate Care (PressureUlcer12/28/16)                       Personal Care Assistance Level of Assistance  Bathing, Feeding, Dressing Bathing Assistance: Maximum assistance Feeding assistance: Limited assistance Dressing Assistance: Maximum assistance     Functional Limitations Info  Sight, Hearing, Speech Sight Info: Impaired Hearing Info: Adequate Speech Info: Adequate    SPECIAL CARE FACTORS FREQUENCY  PT (By licensed PT), OT (By licensed OT)     PT Frequency: 5 OT Frequency: 5            Contractures      Additional Factors Info  Code Status, Allergies, Isolation Precautions Code Status Info: Fullcode Allergies Info: Allergies:  Ace Inhibitors, Lipitor, Metformin And Related, Cefadroxil, Cephalexin, Morphine And Related, Robaxin     Isolation Precautions Info: Contact precautionsInfection:  Extended spectrum beta lactamase, MRSA     Current Medications (09/23/2015):  This is the current hospital active medication list Current Facility-Administered Medications  Medication Dose Route Frequency Provider Last Rate Last Dose  . acetaminophen (TYLENOL) tablet 650 mg  650 mg Oral Q4H PRN Ivor Costa, MD      . aspirin EC tablet 81 mg  81 mg Oral q morning - 10a Virgel Manifold, MD  81 mg at 09/23/15 0919  . diphenoxylate-atropine (LOMOTIL) 2.5-0.025 MG per tablet 1 tablet  1 tablet Oral TID PRN Virgel Manifold, MD   1 tablet at 09/22/15 1632  . ezetimibe (ZETIA) tablet 10 mg  10 mg Oral Daily Virgel Manifold, MD   10 mg at 09/23/15 0920  . furosemide (LASIX) tablet 40 mg  40 mg Oral Daily Virgel Manifold, MD   40  mg at 09/23/15 0919  . heparin injection 5,000 Units  5,000 Units Subcutaneous 3 times per day Ivor Costa, MD   5,000 Units at 09/23/15 1414  . insulin aspart (novoLOG) injection 0-15 Units  0-15 Units Subcutaneous TID WC [name], MD   0 Units at 09/23/15 1200  . insulin NPH Human (HUMULIN N,NOVOLIN N) injection 28 Units  28 Units Subcutaneous BID AC & HS Virgel Manifold, MD   28 Units at 09/23/15 0934  . isosorbide mononitrate (IMDUR) 24 hr tablet 60 mg  60 mg Oral Daily Virgel Manifold, MD   60 mg at 09/23/15 0919  . lisinopril (PRINIVIL,ZESTRIL) tablet 5 mg  5 mg Oral Daily Virgel Manifold, MD   5 mg at 09/23/15 1000  . loratadine (CLARITIN) tablet 10 mg  10 mg Oral Daily Virgel Manifold, MD   10 mg at 09/23/15 0920  . magnesium oxide (MAG-OX) tablet 400 mg  400 mg Oral Daily Virgel Manifold, MD   400 mg at 09/23/15 0919  . metoprolol tartrate (LOPRESSOR) tablet 50 mg  50 mg Oral BID Ivor Costa, MD   50 mg at 09/23/15 0919  . nitroGLYCERIN (NITROSTAT) SL tablet 0.4 mg  0.4 mg Sublingual Q5 min PRN Virgel Manifold, MD      . ondansetron [facility]) tablet 4 mg  4 mg Oral Q6H PRN Ivor Costa, MD       Or  . ondansetron [facility]) injection 4 mg  4 mg Intravenous Q6H PRN Ivor Costa, MD      . oxybutynin (DITROPAN) tablet 5-15 mg  5-15 mg Oral Daily PRN Ivor Costa, MD      . oxyCODONE-acetaminophen (PERCOCET/ROXICET) 5-325 MG per tablet 2 tablet  2 tablet Oral Q6H PRN Ivor Costa, MD   2 tablet at 09/23/15 0959  . pantoprazole (PROTONIX) EC tablet 40 mg  40 mg Oral Daily Virgel Manifold, MD   40 mg at 09/23/15 0920  . rosuvastatin (CRESTOR) tablet 20 mg  20 mg Oral q1800 Ivor Costa, MD      . senna-docusate (Senokot-S) tablet 2 tablet  2 tablet Oral BID PRN Virgel Manifold, MD      . sodium chloride 0.9 % injection 10-40 mL  10-40 mL Intracatheter PRN [name], MD      . sodium chloride 0.9 % injection 3 mL  3 mL Intravenous Q12H Ivor Costa, MD   3 mL at 09/23/15 1000  . traMADol (ULTRAM) tablet 50-100 mg  50-100 mg Oral BID PRN  Virgel Manifold, MD   100 mg at 09/23/15 1414     Discharge Medications: Please see discharge summary for a list of discharge medications.  Relevant Imaging Results:  Relevant Lab Results:   Additional Information SSN: [id number]  Standley Brooking, LCSW

## 2015-09-23 NOTE — Progress Notes (Signed)
Contact precautions placed due to Positive MRSA less then 30 days ago and history of ESBL.

## 2015-09-23 NOTE — Evaluation (Signed)
Physical Therapy Evaluation Patient Details Name: [name] MRN: [id number] DOB: Jul 25, 1946 Today's Date: 09/23/2015   History of Present Illness  70 y.o. male with PMH of DM type II, HTN, PVD, chronic indwelling foley catheter, diabetes mellitus, diastolic congestive heart failure, DVT, stroke, chronic back pain, CAD, who presents with left knee and the right hand pain after fall at home.  Imaging negative for acute bony injuries  Clinical Impression  Pt admitted with above diagnosis. Pt currently with functional limitations due to the deficits listed below (see PT Problem List).  Pt will benefit from skilled PT to increase their independence and safety with mobility to allow discharge to the venue listed below.   Pt requiring mod-max assist for standing and marching in place.  Did not ambulate for safety today as pt had brief syncopal like episode in standing and then L knee buckling in standing.  Pt does not feel he would function well at home in current condition and agreeable to SNF if possible.     Follow Up Recommendations SNF;Supervision/Assistance - 24 hour    Equipment Recommendations  None recommended by PT    Recommendations for Other Services       Precautions / Restrictions Precautions Precautions: Fall Precaution Comments: pt appears to have brief syncopal episodes that he is not aware of Restrictions Weight Bearing Restrictions: No      Mobility  Bed Mobility Overal bed mobility: Needs Assistance Bed Mobility: Supine to Sit;Sit to Supine     Supine to sit: Supervision;HOB elevated Sit to supine: Min assist   General bed mobility comments: increased time and effort, slight assist for LEs onto bed upon return to supine  Transfers Overall transfer level: Needs assistance Equipment used: Rolling walker (2 wheeled) Transfers: Sit to/from Stand Sit to Stand: Mod assist;+2 physical assistance;From elevated surface;Max assist         General transfer  comment: Assist to rise, stabilize, control descent. performed x3, pt with one syncopal episode - was not responding verbally so assisted safety to sitting, performed again and L LE buckled requiring assist to sitting, pt able to stand and march in place approx 2 minutes, did not move away from bed today for safety  Ambulation/Gait                Stairs            Wheelchair Mobility    Modified Rankin (Stroke Patients Only)       Balance Overall balance assessment: Needs assistance;History of Falls         Standing balance support: Bilateral upper extremity supported;During functional activity Standing balance-Leahy Scale: Poor                               Pertinent Vitals/Pain Pain Assessment: Faces (would not rate numerically) Faces Pain Scale: Hurts even more Pain Location: R hand, R hip, L knee Pain Descriptors / Indicators: Sore Pain Intervention(s): Limited activity within patient's tolerance;Monitored during session;Repositioned;Premedicated before session    Home Living Family/patient expects to be discharged to:: Private residence Living Arrangements: Alone Available Help at Discharge: Available PRN/intermittently;Personal care attendant Type of Home: House Home Access: Ramped entrance     Home Layout: One level Home Equipment: Cane - single point;Wheelchair - [employer] - 2 wheels;Walker - standard Additional Comments: pt reports he is supposed to have consult for power w/c on the 18th?    Prior Function Level of Independence:  Needs assistance   Gait / Transfers Assistance Needed: walking with RW            Hand Dominance        Extremity/Trunk Assessment               Lower Extremity Assessment: Generalized weakness (able to perform AROM against gravity)      Cervical / Trunk Assessment: Normal  Communication   Communication: No difficulties  Cognition Arousal/Alertness: Awake/alert Behavior During  Therapy: WFL for tasks assessed/performed Overall Cognitive Status: Within Functional Limits for tasks assessed                      General Comments      Exercises        Assessment/Plan    PT Assessment Patient needs continued PT services  PT Diagnosis Difficulty walking;Generalized weakness;Acute pain   PT Problem List Decreased strength;Decreased activity tolerance;Decreased balance;Decreased mobility;Pain  PT Treatment Interventions Gait training;Functional mobility training;Therapeutic activities;Patient/family education;Balance training;Therapeutic exercise   PT Goals (Current goals can be found in the Care Plan section) Acute Rehab PT Goals PT Goal Formulation: With patient Time For Goal Achievement: 10/07/15 Potential to Achieve Goals: Fair    Frequency Min 3X/week   Barriers to discharge        Co-evaluation               End of Session Equipment Utilized During Treatment: Gait belt Activity Tolerance: Patient limited by fatigue;Patient limited by pain Patient left: in bed;with call bell/phone within reach;with bed alarm set Nurse Communication: Mobility status    Functional Assessment Tool Used: clinical judgement Functional Limitation: Mobility: Walking and moving around Mobility: Walking and Moving Around Current Status [id number]): At least 60 percent but less than 80 percent impaired, limited or restricted Mobility: Walking and Moving Around Goal Status [phone]): At least 1 percent but less than 20 percent impaired, limited or restricted    Time: 1137-1155 PT Time Calculation (min) (ACUTE ONLY): 18 min   Charges:   PT Evaluation $PT Eval Moderate Complexity: 1 Procedure     PT G Codes:   PT G-Codes **NOT FOR INPATIENT CLASS** Functional Assessment Tool Used: clinical judgement Functional Limitation: Mobility: Walking and moving around Mobility: Walking and Moving Around Current Status [id number]): At least 60 percent but less than 80 percent  impaired, limited or restricted Mobility: Walking and Moving Around Goal Status [phone]): At least 1 percent but less than 20 percent impaired, limited or restricted    [name],[name] 09/23/2015, 1:23 PM Carmelia Bake, PT, DPT 09/23/2015 Pager: [phone]

## 2015-09-24 ENCOUNTER — Observation Stay (HOSPITAL_BASED_OUTPATIENT_CLINIC_OR_DEPARTMENT_OTHER): Payer: Medicare Other

## 2015-09-24 DIAGNOSIS — R609 Edema, unspecified: Secondary | ICD-10-CM | POA: Diagnosis not present

## 2015-09-24 DIAGNOSIS — Z794 Long term (current) use of insulin: Secondary | ICD-10-CM

## 2015-09-24 DIAGNOSIS — Z9289 Personal history of other medical treatment: Secondary | ICD-10-CM | POA: Diagnosis not present

## 2015-09-24 DIAGNOSIS — Z86718 Personal history of other venous thrombosis and embolism: Secondary | ICD-10-CM

## 2015-09-24 DIAGNOSIS — I5032 Chronic diastolic (congestive) heart failure: Secondary | ICD-10-CM | POA: Diagnosis not present

## 2015-09-24 DIAGNOSIS — E119 Type 2 diabetes mellitus without complications: Secondary | ICD-10-CM

## 2015-09-24 DIAGNOSIS — M25562 Pain in left knee: Secondary | ICD-10-CM | POA: Diagnosis not present

## 2015-09-24 DIAGNOSIS — I2510 Atherosclerotic heart disease of native coronary artery without angina pectoris: Secondary | ICD-10-CM | POA: Diagnosis not present

## 2015-09-24 DIAGNOSIS — R262 Difficulty in walking, not elsewhere classified: Secondary | ICD-10-CM

## 2015-09-24 DIAGNOSIS — N319 Neuromuscular dysfunction of bladder, unspecified: Secondary | ICD-10-CM

## 2015-09-24 LAB — URINALYSIS, ROUTINE W REFLEX MICROSCOPIC
Bilirubin Urine: NEGATIVE
GLUCOSE, UA: NEGATIVE mg/dL
Ketones, ur: NEGATIVE mg/dL
Nitrite: NEGATIVE
Protein, ur: NEGATIVE mg/dL
SPECIFIC GRAVITY, URINE: 1.012 (ref 1.005–1.030)
pH: 5.5 (ref 5.0–8.0)

## 2015-09-24 LAB — URINE MICROSCOPIC-ADD ON: SQUAMOUS EPITHELIAL / LPF: NONE SEEN

## 2015-09-24 LAB — GLUCOSE, CAPILLARY
Glucose-Capillary: 107 mg/dL — ABNORMAL HIGH (ref 65–99)
Glucose-Capillary: 99 mg/dL (ref 65–99)
Glucose-Capillary: 140 mg/dL — ABNORMAL HIGH (ref 65–99)
Glucose-Capillary: 176 mg/dL — ABNORMAL HIGH (ref 65–99)

## 2015-09-24 NOTE — Progress Notes (Signed)
PROGRESS NOTE  [name] [id number] DOB: December 05, 1945 DOA: 09/22/2015 PCP: Irven Shelling, MD  HPI/Recap of past 24 hours:  Left knee signigicant pain, not able to lift against gravity, left knee buckling in standing, feeling dizzy standing up  Assessment/Plan: Principal Problem:   Left knee pain Active Problems:   Diabetic neuropathy (HCC)   CVA (cerebral infarction)   Chronic indwelling Foley catheter   Chronic diastolic CHF (congestive heart failure) ([place])   Diabetes type 2, uncontrolled (HCC)   History of DVT (deep vein thrombosis)   Neurogenic bladder   Hyperlipidemia LDL goal <70   Type II diabetes mellitus with peripheral circulatory disorder, uncontrolled ([place])   Morbid obesity ([place])   CAD in native artery   Inability to ambulate due to knee   Left hip pain  Left knee pain/osteoarthritis:  left knee x ray: Osteoarthritic change, most marked laterally and involving the patellofemoral joint. Joint effusion present. No acute fracture or Dislocation. Patient with significant pain, not able to lift left knee against gravity, knee buckles when standing up Consider orthopedics consult, knee immobilizer, joint injection vs left knee replacement (does has RCA disease, will need cardiac clearance if need surgery).  Severe deconditioning, wheelchair bound, indwelling foley for years and bowel incontinence, patient cannot take care of himself at home. Need placement  Dizziness: will check orthostatic vital sign.  Recurrent falls: need PT  Hx of UTI due to chronic indwelling Foley catheter: pt did not did the full course of ertapenem through right arm PICC,but his urinalysis is negative today, he is asymptomatic. -Follow-up urine culture  Chronic diastolic CHF (congestive heart failure) ([place]): 2-D echo on 07/04/15 showed EF 55-60 percent with grade 1 diastolic dysfunction. Patient has a trace amount of leg edema, CHF is compensated. -Continue Lasix, aspirin and  metoprolol -BNP 130  Insulin dependent DM-II with neurogenic bladder with chronic indwelling foley and peripheral neuropathy (significant decrease sensation lower extremity) : Last A1c 10.4, poorly controled. Patient is taking Humulin at home -Continue home home in 28 units twice a day  HLD: Last LDL was 106 on 04/30/15 -Continue home medications: Crestor, Zetia  CAD with RCA disease, medically managed. Currently denies chest pain, no sob at rest.  H/o CVA  Remote h/o DVT right lower extremity after long flight (1988).   Obesity/osa: s/p weight loss surgery, gain his weight back.     DVT ppx: SQ Heparin  Code Status: Full code Family Communication: None at bed side.  Disposition Plan: pending   Consultants:  none  Procedures:  none  Antibiotics:  none   Objective: BP 115/56 mmHg  Pulse 71  Temp(Src) 97.9 F (36.6 C) (Oral)  Resp 16  Ht 6\' 1"  (1.854 m)  Wt 155.4 kg (342 lb 9.5 oz)  BMI 45.21 kg/m2  SpO2 91%  Intake/Output Summary (Last 24 hours) at 09/24/15 1929 Last data filed at 09/24/15 1823  Gross per 24 hour  Intake    720 ml  Output   4350 ml  Net  -3630 ml   Filed Weights   09/23/15 0426 09/24/15 0530  Weight: 153.588 kg (338 lb 9.6 oz) 155.4 kg (342 lb 9.5 oz)    Exam:   General:  Obese, NAD  Cardiovascular: RRR  Respiratory: CTABL  Abdomen: Soft/ND/NT, positive BS, indwelling foley  Musculoskeletal: left knee tender, left lower extremity edema, left leg not able to lift against gravity due to pain, bilateral lower extremity chronic venous stasis changes, marked decreased sensation bilateral lower extremity  Neuro: aaox3  Data Reviewed: Basic Metabolic Panel:  Recent Labs Lab 09/22/15 1332 09/23/15 0521  NA 134* 135  K 4.4 4.3  CL 98* 98*  CO2 26 27  GLUCOSE 259* 210*  BUN 11 12  CREATININE 0.64 0.63  CALCIUM 9.0 8.5*   Liver Function Tests:  Recent Labs Lab 09/23/15 0521  AST 18  ALT 17  ALKPHOS 84  BILITOT  1.0  PROT 5.9*  ALBUMIN 2.7*   No results for input(s): LIPASE, AMYLASE in the last 168 hours. No results for input(s): AMMONIA in the last 168 hours. CBC:  Recent Labs Lab 09/22/15 1332 09/23/15 0521  WBC 8.7 9.1  NEUTROABS 5.8  --   HGB 13.5 12.8*  HCT 40.8 38.5*  MCV 91.3 91.4  PLT 205 231   Cardiac Enzymes:    Recent Labs Lab 09/22/15 1332  CKTOTAL 63   BNP (last 3 results)  Recent Labs  12/22/14 2330 07/02/15 1439 09/23/15 0521  BNP 120.1* 320.3* 130.9*    ProBNP (last 3 results) No results for input(s): PROBNP in the last 8760 hours.  CBG:  Recent Labs Lab 09/23/15 1704 09/23/15 2123 09/24/15 0727 09/24/15 1130 09/24/15 1744  GLUCAP 109* 175* 107* 99 140*    Recent Results (from the past 240 hour(s))  Urine culture     Status: None   Collection Time: 09/22/15  4:19 PM  Result Value Ref Range Status   Specimen Description URINE, RANDOM  Final   Special Requests NONE  Final   Culture   Final    NO GROWTH 1 DAY Performed at [facility]    Report Status 09/23/2015 FINAL  Final     Studies: Dg Knee Left Port  09/23/2015  CLINICAL DATA:  Chronic pain, progressing EXAM: PORTABLE LEFT KNEE - 1-2 VIEW COMPARISON:  Left knee radiographs and left knee CT September 22, 2015 FINDINGS: Frontal and lateral views were obtained. There is a moderate joint effusion. No fracture dislocation. There is marked narrowing laterally with moderate patellofemoral joint space narrowing. There is spurring laterally and arising from the patellofemoral joint posteriorly and superiorly. IMPRESSION: Osteoarthritic change, most marked laterally and involving the patellofemoral joint. Joint effusion present. No acute fracture or dislocation. Electronically Signed   By: Lowella Grip III M.D.   On: 09/23/2015 21:03    Scheduled Meds: . aspirin EC  81 mg Oral q morning - 10a  . ezetimibe  10 mg Oral Daily  . furosemide  40 mg Oral Daily  . heparin  5,000 Units  Subcutaneous 3 times per day  . insulin aspart  0-15 Units Subcutaneous TID WC  . insulin NPH Human  28 Units Subcutaneous BID AC & HS  . isosorbide mononitrate  60 mg Oral Daily  . lisinopril  5 mg Oral Daily  . loratadine  10 mg Oral Daily  . magnesium oxide  400 mg Oral Daily  . metoprolol  50 mg Oral BID  . pantoprazole  40 mg Oral Daily  . rosuvastatin  20 mg Oral q1800  . sodium chloride  3 mL Intravenous Q12H    Continuous Infusions:    Time spent: 59mins  Emmerson Shuffield MD, PhD  [facility] Pager [phone]. If 7PM-7AM, please contact night-coverage at [email or web address], password [facility] 09/24/2015, 7:29 PM

## 2015-09-24 NOTE — Care Management (Addendum)
Advanced Beneficiary Notice of Non-coverage explained to patient. Patient signed and provided with a copy. Patient plans to be discharged tomorrow. Presenter, broadcasting BSN CCM

## 2015-09-24 NOTE — Care Management (Signed)
CM spoke with Pamala Hurry at [facility]. The patient is scheduled to be seen on Monday 09/26/15. Presenter, broadcasting BSN CCM

## 2015-09-25 DIAGNOSIS — R262 Difficulty in walking, not elsewhere classified: Secondary | ICD-10-CM | POA: Diagnosis not present

## 2015-09-25 DIAGNOSIS — Z9289 Personal history of other medical treatment: Secondary | ICD-10-CM | POA: Diagnosis not present

## 2015-09-25 DIAGNOSIS — E1151 Type 2 diabetes mellitus with diabetic peripheral angiopathy without gangrene: Secondary | ICD-10-CM

## 2015-09-25 DIAGNOSIS — E1165 Type 2 diabetes mellitus with hyperglycemia: Secondary | ICD-10-CM

## 2015-09-25 DIAGNOSIS — M25562 Pain in left knee: Secondary | ICD-10-CM | POA: Diagnosis not present

## 2015-09-25 DIAGNOSIS — I5032 Chronic diastolic (congestive) heart failure: Secondary | ICD-10-CM | POA: Diagnosis not present

## 2015-09-25 LAB — BASIC METABOLIC PANEL
Anion gap: 10 (ref 5–15)
BUN: 17 mg/dL (ref 6–20)
CHLORIDE: 99 mmol/L — AB (ref 101–111)
CO2: 28 mmol/L (ref 22–32)
CREATININE: 0.65 mg/dL (ref 0.61–1.24)
Calcium: 8.6 mg/dL — ABNORMAL LOW (ref 8.9–10.3)
GFR calc Af Amer: >60 mL/min (ref >60)
Glucose, Bld: 95 mg/dL (ref 65–99)
Potassium: 3.9 mmol/L (ref 3.5–5.1)
SODIUM: 137 mmol/L (ref 135–145)

## 2015-09-25 LAB — CBC
HCT: 38.5 % — ABNORMAL LOW (ref 39.0–52.0)
Hemoglobin: 12.7 g/dL — ABNORMAL LOW (ref 13.0–17.0)
MCH: 30.3 pg (ref 26.0–34.0)
MCHC: 33 g/dL (ref 30.0–36.0)
MCV: 91.9 fL (ref 78.0–100.0)
PLATELETS: 210 10*3/uL (ref 150–400)
RBC: 4.19 MIL/uL — ABNORMAL LOW (ref 4.22–5.81)
RDW: 13.2 % (ref 11.5–15.5)
WBC: 7.4 10*3/uL (ref 4.0–10.5)

## 2015-09-25 LAB — GLUCOSE, CAPILLARY
GLUCOSE-CAPILLARY: 76 mg/dL (ref 65–99)
GLUCOSE-CAPILLARY: 140 mg/dL — AB (ref 65–99)
Glucose-Capillary: 224 mg/dL — ABNORMAL HIGH (ref 65–99)

## 2015-09-25 LAB — MRSA PCR SCREENING: MRSA by PCR: POSITIVE — AB

## 2015-09-25 MED ORDER — MUPIROCIN CALCIUM 2 % EX CREA: TOPICAL_CREAM | Freq: Two times a day (BID) | CUTANEOUS | Status: DC

## 2015-09-25 MED ORDER — MUPIROCIN CALCIUM 2 % EX CREA
TOPICAL_CREAM | Freq: Two times a day (BID) | CUTANEOUS | Status: DC
  Filled 2015-09-25: qty 15

## 2015-09-25 MED ORDER — CHLORHEXIDINE GLUCONATE CLOTH 2 % EX PADS: 6.0000 | MEDICATED_PAD | Freq: Every day | CUTANEOUS | Status: DC

## 2015-09-25 MED ORDER — FUROSEMIDE 40 MG PO TABS: 40.0000 mg | ORAL_TABLET | ORAL | Status: DC

## 2015-09-25 NOTE — Care Management Note (Signed)
Case Management Note  Patient Details  Name: [name] MRN: [id number] Date of Birth: July 17, 1946  Subjective/Objective:                  Left knee pain  Action/Plan: CM spoke with Leafy Ro at [facility] in reference to the hospital bed ordered 09/23/15. [facility] is unable to provide the patient's hospital bed due to his insurance. CM spoke with the patient who agrees to [facility] for the hospital bed. He would like it delivered on Tuesday to allow him time to arrange for delivery. Tiffany at [facility] notified of the DME request. Patient requested CM provide [facility] with his cell phone number [phone].   Expected Discharge Date:                  Expected Discharge Plan:  [place]  In-House Referral:     Discharge planning Services  CM Consult  Post Acute Care Choice:    Choice offered to:     DME Arranged:  Hospital bed DME Agency:  [place]:    [place]:  [place]  Status of Service:  Completed, signed off  Medicare Important Message Given:    Date Medicare IM Given:    Medicare IM give by:    Date Additional Medicare IM Given:    Additional Medicare Important Message give by:     If discussed at [place] of Stay Meetings, dates discussed:    Additional Comments:  Apolonio Schneiders, RN 09/25/2015, 2:39 PM

## 2015-09-25 NOTE — Progress Notes (Signed)
Charge RN called to patient's room because [facility] was here to transport the patient home, but they were refusing to transport per their supervisor. The supervisor for [facility] was concerned for patient's safety as he was being discharged to home, where he lives alone and he had reported that there would be no one at his home to help him. The patient needed [facility] transportation as he was in the hospital with problems with his knee and was not able to ambulate. [name] had been consulted this admission and they worked to try and get him placement in a SNF, but the patient reports that he was told he had used all of his days and that he would have to pay out of pocket for his stay at a SNF. The patient refused that option and wanted to go home. Home health was arranged for the patient, but [facility] was concerned that he did not know what time Home Health would come tomorrow and what if something happened while he was alone. They feared that they would be held liable. The patient was upset that [facility] was refusing to transport him because he has capacity and he had made a decision to go home. He was also upset because he had been told that he would be responsible for any hospital days after today because he no longer receiving medical treatment that would require him to stay in the hospital. The [facility] transporters talked with their supervisor and came to an agreement with the patient that they would transport him home, but that he would have to sign a form that states he will accept all responsibility for any medical problems that may occur after they have left his home and that the [facility] service could not be held liable. The patient agreed to this. [facility] also encouraged the patient to allow them to hide a spare key outside his home so that if an emergency came about, the emergency personnel could use the key to get in so that they would not have to break down his door. The patient also agreed to this. [facility] will follow up on that. The  patient was transported by [facility] to his home per the patient's request.   Othella Boyer [facility] 09/25/2015 7:27 PM

## 2015-09-25 NOTE — Discharge Summary (Addendum)
Discharge Summary  [name] [id number] DOB: [birth year]  PCP: Irven Shelling, MD  Admit date: 09/22/2015 Discharge date: 09/25/2015  Time spent: >16mins  Recommendations for Outpatient Follow-up:  F/u with [facility] within a week to discuss left knee injection or left knee replacement  Discharge Diagnoses:  Active Hospital Problems   Diagnosis Date Noted  . Left knee pain 09/23/2015  . Inability to ambulate due to knee 09/23/2015  . Left hip pain 09/23/2015  . CAD in native artery 09/15/2015  . Morbid obesity ([place]) 07/03/2015  . Type II diabetes mellitus with peripheral circulatory disorder, uncontrolled ([place]) 05/09/2015  . Hyperlipidemia LDL goal <70 05/02/2015  . Neurogenic bladder 04/26/2015  . History of DVT (deep vein thrombosis) 12/08/2014  . Diabetes type 2, uncontrolled ([place])   . Chronic diastolic CHF (congestive heart failure) ([place]) 04/01/2014  . Chronic indwelling Foley catheter 12/23/2013  . CVA (cerebral infarction) 03/11/2012  . Diabetic neuropathy ([place]) 10/10/2011    Resolved Hospital Problems   Diagnosis Date Noted Date Resolved  No resolved problems to display.    Discharge Condition: stable  Diet recommendation: heart healthy/carb modified  Filed Weights   09/23/15 0426 09/24/15 0530 09/25/15 0653  Weight: 153.588 kg (338 lb 9.6 oz) 155.4 kg (342 lb 9.5 oz) 153.7 kg (338 lb 13.6 oz)    History of present illness:  [name] is a 70 y.o. male with PMH of DM type II, HTN, PVD, chronic indwelling foley catheter, diabetes mellitus, diastolic congestive heart failure, DVT, stroke, chronic back pain, CAD, who presents with left knee and the right hand pain after fall.  Pt reports that he had fall when he was trying to change the bag in a garbage, and lost his balance today. He fell onto his right side. He developed pain over left knee and the right hand. He also had a head injury, but no LOC. He has some pain in  occipital region/upper neck. He was recent hospitalized and treated for UTI due to extended-spectrum beta lactamase (ESBL)-producing Klebsiella. He initially went to a skilled nursing facility but reports that he was discharged back to his residence 4 days ago. He was supposed to complete a course of ertapenem through right arm PICC up until today. He says he has not had any abx since he has been home though. Patient does not have fever, chills, symptoms of UTI. Patient does not have chest pain, shortness of breath, abdominal pain, diarrhea.  At baseline he describes poor mobility with only able to ambulate short distances with a walker. EMS reports that home in very poor upkeep. Trash everywhere to point that difficult to get into certain areas of home. Containers are full of urine and general disarray.   In ED, patient was found to have negative urinalysis, CK 63, WBC 8.7, temperature normal, no tachycardia, electrolytes and renal function okay. CT head and C-spine negative for acute abnormalities. X-ray of the left hip/pelvis, left knee and right hand are all negative for acute bony abnormalities. Patient's and admitted to inpatient for observation.   Hospital Course:  Principal Problem:   Left knee pain Active Problems:   Diabetic neuropathy (HCC)   CVA (cerebral infarction)   Chronic indwelling Foley catheter   Chronic diastolic CHF (congestive heart failure) (HCC)   Diabetes type 2, uncontrolled (HCC)   History of DVT (deep vein thrombosis)   Neurogenic bladder   Hyperlipidemia LDL goal <70   Type II diabetes mellitus with peripheral circulatory disorder,  uncontrolled ([place])   Morbid obesity ([place])   CAD in native artery   Inability to ambulate due to knee   Left hip pain  Left knee pain/osteoarthritis: left knee x ray: Osteoarthritic change, most marked laterally and involving the patellofemoral joint. Joint effusion present. No acute fracture or Dislocation. Patient with  significant pain, not able to lift left knee against gravity, knee buckles when standing up I have talked to [place] PA,  Patient is to see orthopedics in a week to discuss joint injection vs left knee replacement . Not able to fit in knee immobilizer due to size per patient.  Severe deconditioning, wheelchair bound, Recurrent falls indwelling foley for years and bowel incontinence, patient cannot take care of himself at home. Need placement, unfortunately patient has exhausted his medicare bed days, not able to be placed in SNF, he is [facility]. PT/OT/RN/aids/social worker   Hx of  MDR UTI due to chronic indwelling Foley catheter: pt did not did the full course of ertapenem through right arm PICC,but his urinalysis is negative today, he is asymptomatic. -repeat ua /urine culture unremarkable, picc line d/ced.  Chronic diastolic CHF (congestive heart failure) ([place]): 2-D echo on 07/04/15 showed EF 55-60 percent with grade 1 diastolic dysfunction. Patient has a trace amount of leg edema, CHF is compensated. -Continue Lasix, aspirin and metoprolol -BNP 130 -no edema at discharge, patient report feeling dizzy standing up, changed lasix from daily to MWF.  Insulin dependent DM-II with neurogenic bladder with chronic indwelling foley and peripheral neuropathy (significant decrease sensation lower extremity) : Last A1c 10.4, poorly controled. Patient is taking Humulin at home -Continue home home in 28 units twice a day  HLD: Last LDL was 106 on 04/30/15 -Continue home medications: Crestor, Zetia  CAD with RCA disease, medically managed. Currently denies chest pain, no sob at rest.  H/o CVA; stable at baseline  Remote h/o DVT right lower extremity after long flight (1988).   Obesity/osa: s/p weight loss surgery, gained weight back.  MRSA colonization: decolonization with bactroban and chlorhexidine.  Code Status: Full code Family Communication:  None at bed side.  Disposition Plan: home, patient is barely able to stand up, barely able to perform ADL, unable to drive, cook, shower, toileting, not able to do laundry, he has been wheel chair bound, now due to left shoulder pain ,hardly able to push his wheelchair around,  unfortunately patient has exhausted his medicare bed days, not able to be placed in SNF, he is discharged [facility]. PT/OT/RN/aids/social worker   Consultants:  Phone conversation with [facility] Pricilla Holm, PA, who recommended patient to follow up with ortho in a week to discuss left knee injection or total knee replacement  Procedures:  none  Antibiotics:  none   Discharge Exam: BP 136/61 mmHg  Pulse 76  Temp(Src) 97.8 F (36.6 C) (Oral)  Resp 16  Ht 6\' 1"  (1.854 m)  Wt 153.7 kg (338 lb 13.6 oz)  BMI 44.72 kg/m2  SpO2 98%   General: Obese, NAD  Cardiovascular: RRR  Respiratory: CTABL  Abdomen: Soft/ND/NT, positive BS, indwelling foley  Musculoskeletal: left knee tender, left lower extremity edema, left leg not able to lift against gravity due to pain, bilateral lower extremity chronic venous stasis changes, marked decreased sensation bilateral lower extremity  Neuro: aaox3   Discharge Instructions You were cared for by a hospitalist during your hospital stay. If you have any questions about your discharge medications or  the care you received while you were in the hospital after you are discharged, you can call the unit and asked to speak with the hospitalist on call if the hospitalist that took care of you is not available. Once you are discharged, your primary care physician will handle any further medical issues. Please note that NO REFILLS for any discharge medications will be authorized once you are discharged, as it is imperative that you return to your primary care physician (or establish a relationship with a primary care physician if you do not have one) for  your aftercare needs so that they can reassess your need for medications and monitor your lab values.  Discharge Instructions    Diet - low sodium heart healthy    Complete by:  As directed      Increase activity slowly    Complete by:  As directed             Medication List    STOP taking these medications        ertapenem 1 g in sodium chloride 0.9 % 50 mL      TAKE these medications        acetaminophen 325 MG tablet  Commonly known as:  TYLENOL  Take 2 tablets (650 mg total) by mouth every 4 (four) hours as needed for headache or mild pain.     aspirin EC 81 MG tablet  Take 81 mg by mouth every morning.     Chlorhexidine Gluconate Cloth 2 % Pads  Apply 6 each topically daily at 6 (six) AM.     diphenoxylate-atropine 2.5-0.025 MG tablet  Commonly known as:  LOMOTIL  Take 1 tablet by mouth 3 (three) times daily as needed for diarrhea or loose stools.     ezetimibe 10 MG tablet  Commonly known as:  ZETIA  Take 1 tablet (10 mg total) by mouth daily.     fexofenadine 180 MG tablet  Commonly known as:  ALLEGRA  Take 180 mg by mouth daily as needed for allergies or rhinitis.     furosemide 40 MG tablet  Commonly known as:  LASIX  Take 1 tablet (40 mg total) by mouth every Monday, Wednesday, and Friday.     insulin NPH Human 100 UNIT/ML injection  Commonly known as:  HUMULIN N  Inject 0.28 mLs (28 Units total) into the skin 2 (two) times daily at 8 am and 10 pm.     isosorbide mononitrate 60 MG 24 hr tablet  Commonly known as:  IMDUR  Take 1 tablet (60 mg total) by mouth daily.     lisinopril 5 MG tablet  Commonly known as:  PRINIVIL,ZESTRIL  Take 5 mg by mouth daily.     magnesium oxide 400 (241.3 Mg) MG tablet  Commonly known as:  MAG-OX  Take 1 tablet (400 mg total) by mouth daily.     metoprolol 50 MG tablet  Commonly known as:  LOPRESSOR  Take 50 mg by mouth 2 (two) times daily.     mupirocin cream 2 %  Commonly known as:  BACTROBAN  Apply  topically 2 (two) times daily.     nitroGLYCERIN 0.4 MG SL tablet  Commonly known as:  NITROSTAT  Place 1 tablet (0.4 mg total) under the tongue every 5 (five) minutes as needed for chest pain.     omeprazole 20 MG capsule  Commonly known as:  PRILOSEC  Take 20 mg by mouth daily.     oxybutynin 5  MG tablet  Commonly known as:  DITROPAN  Take 5-15 mg by mouth daily as needed for bladder spasms.     rosuvastatin 20 MG tablet  Commonly known as:  CRESTOR  Take 1 tablet (20 mg total) by mouth daily at 6 PM.     senna-docusate 8.6-50 MG tablet  Commonly known as:  Senokot-S  Take 2 tablets by mouth daily.     traMADol 50 MG tablet  Commonly known as:  ULTRAM  Take 50-100 mg by mouth 2 (two) times daily as needed for moderate pain.       Allergies  Allergen Reactions  . Ace Inhibitors Swelling    Pt tolerates lisinopril  . Lipitor [Atorvastatin Calcium] Swelling  . Metformin And Related Swelling  . Cefadroxil Hives  . Cephalexin Hives  . Morphine And Related Other (See Comments)    Sweating, feels like is "in rocky boat."  . Robaxin [Methocarbamol] Other (See Comments)    Feels like he is shaky       Follow-up Information    Follow up with GRAVES,JOHN L, MD In 1 week.   Specialty:  Orthopedic Surgery   Why:  left knee pain, not albe to walk, need joint injection or knee replacement   Contact information:   [place] [id number] [phone]        The results of significant diagnostics from this hospitalization (including imaging, microbiology, ancillary and laboratory) are listed below for reference.    Significant Diagnostic Studies: Ct Head Wo Contrast  09/22/2015  CLINICAL DATA:  Status post fall. Loss balance. Occurred 2 days ago. EXAM: CT HEAD WITHOUT CONTRAST CT CERVICAL SPINE WITHOUT CONTRAST TECHNIQUE: Multidetector CT imaging of the head and cervical spine was performed following the standard protocol without intravenous contrast.  Multiplanar CT image reconstructions of the cervical spine were also generated. COMPARISON:  04/14/2015 FINDINGS: CT HEAD FINDINGS There is no evidence of mass effect, midline shift, or extra-axial fluid collections. There is no evidence of a space-occupying lesion or intracranial hemorrhage. There is no evidence of a cortical-based area of acute infarction. There are calcifications in bilateral base a ganglia and thalamus. There is generalized cerebral atrophy. There is periventricular white matter low attenuation likely secondary to microangiopathy. The ventricles and sulci are appropriate for the patient's age. The basal cisterns are patent. Visualized portions of the orbits are unremarkable. The visualized portions of the paranasal sinuses and mastoid air cells are unremarkable. Cerebrovascular atherosclerotic calcifications are noted. The osseous structures are unremarkable. CT CERVICAL SPINE FINDINGS The alignment is anatomic. The vertebral body heights are maintained. There is no acute fracture. There is no static listhesis. The prevertebral soft tissues are normal. The intraspinal soft tissues are not fully imaged on this examination due to poor soft tissue contrast, but there is no gross soft tissue abnormality. The disc spaces are maintained. There are mild right uncovertebral degenerative changes at C3-4. The visualized portions of the lung apices demonstrate no focal abnormality. There is bilateral carotid artery atherosclerosis. IMPRESSION: 1. No acute intracranial pathology. 2. No acute osseous injury of the cervical spine. Electronically Signed   By: Kathreen Devoid   On: 09/22/2015 13:14   Ct Chest Wo Contrast  09/07/2015  CLINICAL DATA:  Inpatient with dyspnea and productive cough. History of CHF. EXAM: CT CHEST WITHOUT CONTRAST TECHNIQUE: Multidetector CT imaging of the chest was performed following the standard protocol without IV contrast. COMPARISON:  09/14/2014 chest CT angiogram. FINDINGS:  Mediastinum/Nodes: Normal heart size.  No pericardial fluid/thickening. Coronary atherosclerosis. Great vessels are normal in course and caliber. Normal visualized thyroid. Normal esophagus. No pathologically enlarged axillary, mediastinal or gross right hilar lymph nodes, noting limited sensitivity for the detection of hilar adenopathy on this noncontrast study. There is questionable fullness in the left suprahilar region. Lungs/Pleura: No pneumothorax. No pleural effusion. Left upper lobe 3 mm solid pulmonary nodule (series 5/ image 9), stable since 09/14/2014. No acute consolidative airspace disease, additional significant pulmonary nodules or lung masses. Upper abdomen: Partially visualized are postsurgical changes in the proximal stomach, with limited visualization due to streak artifact from surgical clips. Musculoskeletal: No aggressive appearing focal osseous lesions. Moderate degenerative changes in the thoracic spine. Stable symmetric moderate gynecomastia. IMPRESSION: 1. Questionable fullness in the left suprahilar region, cannot exclude left hilar adenopathy. Recommend further characterization with chest CT with IV contrast. 2. Left upper lobe 3 mm solid pulmonary nodule, for which 11 month stability has been demonstrated. If the patient is at high risk for bronchogenic carcinoma, follow-up chest CT at 1 year is recommended. If the patient is at low risk, no follow-up is needed. This recommendation follows the consensus statement: Guidelines for Management of Small Pulmonary Nodules Detected on CT Scans: A Statement from the [place] as published in Radiology 2005; 237:395-400. 3. No acute consolidative airspace disease. 4. Coronary atherosclerosis. Electronically Signed   By: Ilona Sorrel M.D.   On: 09/07/2015 22:08   Ct Cervical Spine Wo Contrast  09/22/2015  CLINICAL DATA:  Status post fall. Loss balance. Occurred 2 days ago. EXAM: CT HEAD WITHOUT CONTRAST CT CERVICAL SPINE WITHOUT  CONTRAST TECHNIQUE: Multidetector CT imaging of the head and cervical spine was performed following the standard protocol without intravenous contrast. Multiplanar CT image reconstructions of the cervical spine were also generated. COMPARISON:  04/14/2015 FINDINGS: CT HEAD FINDINGS There is no evidence of mass effect, midline shift, or extra-axial fluid collections. There is no evidence of a space-occupying lesion or intracranial hemorrhage. There is no evidence of a cortical-based area of acute infarction. There are calcifications in bilateral base a ganglia and thalamus. There is generalized cerebral atrophy. There is periventricular white matter low attenuation likely secondary to microangiopathy. The ventricles and sulci are appropriate for the patient's age. The basal cisterns are patent. Visualized portions of the orbits are unremarkable. The visualized portions of the paranasal sinuses and mastoid air cells are unremarkable. Cerebrovascular atherosclerotic calcifications are noted. The osseous structures are unremarkable. CT CERVICAL SPINE FINDINGS The alignment is anatomic. The vertebral body heights are maintained. There is no acute fracture. There is no static listhesis. The prevertebral soft tissues are normal. The intraspinal soft tissues are not fully imaged on this examination due to poor soft tissue contrast, but there is no gross soft tissue abnormality. The disc spaces are maintained. There are mild right uncovertebral degenerative changes at C3-4. The visualized portions of the lung apices demonstrate no focal abnormality. There is bilateral carotid artery atherosclerosis. IMPRESSION: 1. No acute intracranial pathology. 2. No acute osseous injury of the cervical spine. Electronically Signed   By: Kathreen Devoid   On: 09/22/2015 13:14   Ct Knee Left Wo Contrast  09/22/2015  CLINICAL DATA:  [place] 2 days ago.  Left knee pain. EXAM: CT OF THE LEFT KNEE WITHOUT CONTRAST TECHNIQUE: Multidetector CT  imaging of the left knee was performed according to the standard protocol. Multiplanar CT image reconstructions were also generated. COMPARISON:  Radiographs 09/22/2015 FINDINGS: There are moderate to advanced tricompartmental degenerative changes most  significant in the lateral compartment. There is joint space narrowing, osteophytic spurring and subchondral cystic change. No acute fracture or osteochondral lesion. There is a moderate-sized joint effusion. Moderate to advanced fatty atrophy of the lower thigh and calf musculature. IMPRESSION: 1. Tricompartmental degenerative changes. 2. No acute fracture. 3. Moderate-sized joint effusion. 4. Advanced fatty atrophy of the lower thigh and upper calf musculature. Electronically Signed   By: Marijo Sanes M.D.   On: 09/22/2015 15:46   Dg Knee Complete 4 Views Left  09/22/2015  CLINICAL DATA:  Status post fall.  Left hip pain. EXAM: LEFT KNEE - COMPLETE 4+ VIEW COMPARISON:  None. FINDINGS: Generalized osteopenia. No fracture or dislocation. Tricompartmental osteoarthritis of the left knee most severe in the lateral femorotibial compartment. There is no aggressive lytic or sclerotic osseous lesion. IMPRESSION: No acute osseous injury of the left knee. Electronically Signed   By: Kathreen Devoid   On: 09/22/2015 13:16   Dg Knee Left Port  09/23/2015  CLINICAL DATA:  Chronic pain, progressing EXAM: PORTABLE LEFT KNEE - 1-2 VIEW COMPARISON:  Left knee radiographs and left knee CT September 22, 2015 FINDINGS: Frontal and lateral views were obtained. There is a moderate joint effusion. No fracture dislocation. There is marked narrowing laterally with moderate patellofemoral joint space narrowing. There is spurring laterally and arising from the patellofemoral joint posteriorly and superiorly. IMPRESSION: Osteoarthritic change, most marked laterally and involving the patellofemoral joint. Joint effusion present. No acute fracture or dislocation. Electronically Signed   By:  Lowella Grip III M.D.   On: 09/23/2015 21:03   Dg Hand Complete Right  09/22/2015  CLINICAL DATA:  Pain following fall 2 days prior EXAM: RIGHT HAND - COMPLETE 3+ VIEW COMPARISON:  None. FINDINGS: Frontal, oblique, and lateral views were obtained. There is no demonstrable fracture or dislocation. There is marked narrowing of the radiocarpal joint. There is milder narrowing in the scaphotrapezial and first carpal -metacarpal joints. There is also moderate narrowing in the first MCP joint. No erosive change. There are scattered foci of arterial vascular calcification, primarily in the radial artery. IMPRESSION: Multifocal osteoarthritic change, most marked in the radiocarpal joint. Radial artery vascular calcification. No acute fracture or dislocation. Electronically Signed   By: Lowella Grip III M.D.   On: 09/22/2015 13:20   Dg Hip Unilat With Pelvis 2-3 Views Left  09/22/2015  CLINICAL DATA:  Pain following fall 2 days prior EXAM: DG HIP (WITH OR WITHOUT PELVIS) 2-3V LEFT COMPARISON:  None. FINDINGS: Frontal pelvis as well as frontal and lateral left hip images were obtained. There is no fracture or dislocation. There is advanced osteoarthritic change in the right hip joint with marked loss of joint space on the right and multiple subchondral cystic changes in the right femoral head. There is slight narrowing of the left hip joint. No erosive changes. IMPRESSION: No fracture or dislocation. Advanced osteoarthritic change right hip joint. Slight narrowing left hip joint. Electronically Signed   By: Lowella Grip III M.D.   On: 09/22/2015 13:17    Microbiology: Recent Results (from the past 240 hour(s))  Urine culture     Status: None   Collection Time: 09/22/15  4:19 PM  Result Value Ref Range Status   Specimen Description URINE, RANDOM  Final   Special Requests NONE  Final   Culture   Final    NO GROWTH 1 DAY Performed at [facility]    Report Status 09/23/2015 FINAL  Final    MRSA PCR  Screening     Status: Abnormal   Collection Time: 09/24/15 10:56 PM  Result Value Ref Range Status   MRSA by PCR POSITIVE (A) NEGATIVE Final    Comment:        The GeneXpert MRSA Assay (FDA approved for NASAL specimens only), is one component of a comprehensive MRSA colonization surveillance program. It is not intended to diagnose MRSA infection nor to guide or monitor treatment for MRSA infections. RESULT CALLED TO, READ BACK BY AND VERIFIED WITH: A.SESAY,RN AT 0210 ON 09/25/15 BY W.SHEA      Labs: Basic Metabolic Panel:  Recent Labs Lab 09/22/15 1332 09/23/15 0521 09/25/15 0535  NA 134* 135 137  K 4.4 4.3 3.9  CL 98* 98* 99*  CO2 26 27 28   GLUCOSE 259* 210* 95  BUN 11 12 17   CREATININE 0.64 0.63 0.65  CALCIUM 9.0 8.5* 8.6*   Liver Function Tests:  Recent Labs Lab 09/23/15 0521  AST 18  ALT 17  ALKPHOS 84  BILITOT 1.0  PROT 5.9*  ALBUMIN 2.7*   No results for input(s): LIPASE, AMYLASE in the last 168 hours. No results for input(s): AMMONIA in the last 168 hours. CBC:  Recent Labs Lab 09/22/15 1332 09/23/15 0521 09/25/15 0535  WBC 8.7 9.1 7.4  NEUTROABS 5.8  --   --   HGB 13.5 12.8* 12.7*  HCT 40.8 38.5* 38.5*  MCV 91.3 91.4 91.9  PLT 205 231 210   Cardiac Enzymes:  Recent Labs Lab 09/22/15 1332  CKTOTAL 63   BNP: BNP (last 3 results)  Recent Labs  12/22/14 2330 07/02/15 1439 09/23/15 0521  BNP 120.1* 320.3* 130.9*    ProBNP (last 3 results) No results for input(s): PROBNP in the last 8760 hours.  CBG:  Recent Labs Lab 09/24/15 1130 09/24/15 1744 09/24/15 2128 09/25/15 0754 09/25/15 1141  GLUCAP 99 140* 176* 76 224*       Signed:  Yanilen Adamik MD, PhD  [facility] 09/25/2015, 1:13 PM

## 2015-09-26 ENCOUNTER — Other Ambulatory Visit: Payer: Self-pay

## 2015-09-26 NOTE — Progress Notes (Signed)
This CM was informed that pt discharged on Sunday with [facility].  [facility] on Friday declined to take pt back related to an unsafe environment.  A call to [facility], and [facility] was made, none of these Agencies will take pt back due to no changes to his home environment, which is unsafe.  [facility] was called by [facility].

## 2015-09-26 NOTE — Patient Outreach (Signed)
10:30 am  Initial transition of care call: Reviewed discharge note from 09/25/2015 in EPIC Arrived at home by [facility] after 7pm on 09/25/2015  Placed call to patient for follow up on transition to home. Patient reports he is okay right now. States he was asleep when I called. States that he has not taken his medications today.  Patient report that he has eaten a sandwich since he has gotten home. Reports being out of the chair once. States that when he was in the hospital he only got out of bed 1 time. States he has knee pain.    Food:  Patient reports that he has food that he got from a [place].   Medications:  Reviewed discharge medications with patient and he reports that he has most of his medications and is able to take his insulin.  Has not taken today's insulin.  Transportation:  Patient reports using [facility].    Home Health:  Patient reports he has not heard from [facility].  Patient reports that he is getting a hospital bed on 09/27/2015 from [facility]. Patient reports follow up with primary MD on 10/04/2015. Plans to call [facility] for transportation. Patient reports he will call orthopedist for an appointment for hospital follow up.   1045: Placed call to [facility]. Spoke with Malachy Mood Ward who states that they ([facility]) refused to take patient back last week. Reports that she notified hospital social worker and case Freight forwarder.   Reports "home environment is unsafe". Ms. Leonides Schanz states that she has called Adult Protective Services today about a referral  and had to leave a message.   11:00 Spoke with Deitra Mayo , [facility] social worker to discuss problems with current discharge plan. 11:10 am  Spoke with [name], hospital lisaon who will discuss no valid home health agency with hospital Case manager.  11:30  Call back from Ms. [place] Crane who reports all home health agencies have refused to accept patient. ( [facility], [facility] ( care [facility]) , [facility] and [facility].  11:40  Left message for [facility] social worker to provide update.  12:20 pm.  Reviewed case with Surveyor, quantity. Suggested  Mobile meal order with [facility] and will review case and determine next steps for home visits and home care. 12:45pm  Spoke with Malachy Mood Ward with [facility] who states that she spoke with Jeronimo Greaves with Adult protective services who will hopefully see patient today.  I requested someone from [facility] contact patient to inform him that they will not see coming to see him. Ms. Leonides Schanz agreed to call patient.  1:30pm.  Update provided to [place] and [employer].  Deitra Mayo will contact Adult Protective Services to see if visit completed today.    2pm  Follow up call from [place]:  Update provided on Adult Protective Services pending home visit.  At this time [facility] home visits on hold until investigation from Adult [employer] and approval by leadership.  Mobile meals also on hold pending Adult Protective Services decisions.   4:20pm  [facility] Social worker waiting for a call back from Adult Protective services. [facility] SW and leadership updated with progress.  4:45pm  Placed call to primary MD and left a message for Dr.John Laurann Montana to call back. Provided brief update on voicemail.       PLAN: Awaiting Adult Protective Services follow up.  Will continue to outreach patient telephonically.             Placed call to MD to update on current events.             Will send MD this noted and a letter of involvement.   [facility] CM Care Plan Problem One        Most Recent Value   Care Plan Problem One  Recent hospital admission    Role Documenting the Problem One  Care Management [place] for Problem One  Active   THN Long Term Goal (31-90 days)  Patient will verbalize no readmission in the next 31 days   THN Long Term Goal Start Date  09/26/15   Interventions for Problem One Long Term Goal   Transition of care call completed,  Placed call to social worker and hospital liasion, Placed call to [facility] to inquire about home health visit,  reviewed case with Surveyor, quantity.    THN CM Short Term Goal #1 (0-30 days)  Patient will see primary MD as planned on 10/04/2015   [facility] CM Short Term Goal #1 Start Date  09/26/15   Interventions for Short Term Goal #1  Reviewed importance of timely follow up. Discussed importance of calling MD with any concerns.      [name], RN, BSN, [employer] [facility] [employer] [phone]

## 2015-09-27 ENCOUNTER — Other Ambulatory Visit: Payer: Self-pay | Admitting: *Deleted

## 2015-09-27 NOTE — Patient Outreach (Addendum)
[facility]) Care Management  09/27/2015  [place] 06/18/1946 [id number]   [name] made an initial attempt to try and contact patient today to perform phone assessment, as well as assess and assist with social work needs and services; however, patient was unavailable.  A HIPAA compliant message was left on voicemail.  [name] is currently awaiting a return call. [name] made an attempt to try and contact patient's Adult Materials engineer, Chase Caller with the [place], but she too was unavailable.  A HIPAA compliant message was left on voicemail. [name] was able to make contact with a Therapist, art with [facility] to confirm that patient's hospital bed is scheduled to be delivered today between the hours of 10am - 2pm.  [name] will make arrangements to try and meet with patient for the initial home visit during this time, to assess patient's home situation.  [name] will then report findings to patient's RNCM with [place] [facility], [name].  [name], BSW, MSW, LCSW  Licensed Education officer, environmental Health System  Mailing [place] [address], [place], [place] [address] Physical Address[address], [place], [place] [address] Toll Free Main # [phone] Fax # [phone] Cell # [phone]  Fax # [phone]  [name]

## 2015-09-27 NOTE — Patient Outreach (Addendum)
[facility]) Care Management  09/27/2015  Akif Wojno Lloyd 11/03/1945 [id number]   [name] was able to make contact with patient today, shortly after patient's hospital bed was delivered to patient's home, as patient admitted to being short-of-breath and weak from letting representatives from [facility] into his home.  [name] inquired specifically about the delivery, as [name] had requested to be notified when the delivery drivers were in route to patient's home to deliver the bed, as [name] had made arrangements to be available to perform the initial home visit.  Patient reported, "They came and went in no time".  Patient indicated that there were no problems with setting up the hospital bed, at least to his knowledge.  Patient refused a home visit from [place] today, stating "I just don't have it in me to answer the door again, that one time took all my energy".  Patient was agreeable to [place] visiting with patient on the morning of Wednesday, September 28, 2015.   Patient was under the impression that he would be receiving home health nursing services, but was unfamiliar with the name of the home health agency.  [name] explained to patient that it was [name]'s understanding that patient would not be receiving home care services, but that the agency that received the referral in the hospital would be getting in contact with patient to explain further.  [name] inquired as to whether patient had any nursing needs that required immediate attention.  Patient denied. [name] then inquired as to whether or not patient felt safe to stay at home alone. Patient assured [name] that he was "perfectly comfortable at home".  Patient indicated that he had food to eat and that he was taking his medications exactly as prescribed.   [name] received a return call from Chase Caller, Adult Materials engineer with the [place], indicating that Ulice Bold is actually assigned to patient's case,  as Mrs. Dominica Severin was just on call for [employer] holiday.  Mrs. Dominica Severin went on to say that Mrs. Carlyon Shadow was just assigned to patient's case today, discouraging [name] to contact Mrs. Otuonye to obtain any information.  Mrs. Dominica Severin indicated that [name] could not be present during the time of Mrs. Otuonye's visit with patient.  Last, Mrs. Dominica Severin reported that it is not in the best interest of [name] to visit with patient in the home until after Adult Protective Services has completed their investigation.  [name] explained the urgency of the situation, indicating that [name] is trying to prevent patient from being readmitted into the hospital, or worse.  Mrs. Dominica Severin voiced understanding, encouraging [name] to contact Mrs. Otuonye to provide any additional information that may help substantiate patient's case. [name] made an attempt to try and contact patient's Adult Materials engineer, Ulice Bold with the [place], but she was unavailable.  A HIPAA compliant message was left on voicemail.  [name] is currently awaiting a return call.  [name] has contacted [name], [facility] with [facility], to report findings of al phone conversations involving patient today.  [name] will fax a correspondence letter to patient's Primary Care Physician, Dr. Marina Goodell to ensure that [name] is aware of [name]'s involvement with patient's care.  [name], BSW, MSW, LCSW  Licensed Education officer, environmental Health System  Mailing [place] [address] [place], [place] [address] Physical Address-[address], [place], [place] [address]  Toll Free Main # [phone] Fax # [phone] Cell # [phone]  Fax # [phone]  [name]   [place] complies with [employer] civil rights laws and does not discriminate on the basis of race, color, national origin, age, disability, or  sex.  Espaol (Spanish)  [place] cumple con las leyes federales de derechos civiles aplicables y no discrimina por motivos de raza, color, nacionalidad, edad, discapacidad o sexo.     Ti?ng Vi?t ([place])  [place] tun th? lu?t dn quy?n hi?n hnh c?a Lin bang v khng phn bi?t ?i x? d?a trn ch?ng t?c, mu da, ngu?n g?c qu?c gia, ? tu?i, khuy?t t?t, ho?c gi?i tnh.     (Arabic)

## 2015-09-28 ENCOUNTER — Encounter: Payer: Self-pay | Admitting: *Deleted

## 2015-09-28 ENCOUNTER — Other Ambulatory Visit: Payer: Self-pay | Admitting: *Deleted
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
## 2015-09-29 NOTE — Patient Outreach (Signed)
[facility]) Care Management  09/29/2015  [place] Mcclammy [place] 1946/06/07 [id number]   Return call received from Ulice Bold, Adult Protective Services Worker with the [place], requesting assistance with coordinating placement arrangements for patient.  Mrs. Otuonye did not indicate whether or not she was able to substantiate patient's case, but rather reported that she is willing to work with [name] to initiate long-term care placement for patient.  Mrs. Carlyon Shadow indicated that patient will not qualify for Long-Term Care Medicaid coverage, as patient well exceeds the poverty guidelines for the [place].  [name] agreed to obtain a completed and signed FL-2 Form for patient from patient's Primary Care Physician, [name], as well as request that a TB Skin Test be administered, if not completed within the last year.  [name] will then fax the FL-2 Form to all assisted living facilities within a 50-mile radius.  Once bed offers are received, [name] will discuss these offers with patient and Mrs. Otuonye to ensure that the facilities of choice are within patient's price range.  Mrs. Carlyon Shadow reports that patient does not need to worry about trying to sale his house right away.  This was a huge relief for patient.  [name], BSW, MSW, LCSW  Licensed Education officer, environmental Health System  Mailing [place] [address] [place], [place] [address] Physical Address-[address], [place], [place] [address] Toll Free Main # [phone] Fax # [phone] Cell # [phone]  Fax # [phone]  [name] complies with [employer] civil rights laws and does not discriminate on the basis of race, color, national origin, age, disability, or sex.  Espaol (Spanish)  [place] cumple con las leyes federales de derechos  civiles aplicables y no discrimina por motivos de raza, color, nacionalidad, edad, discapacidad o sexo.     Ti?ng Vi?t ([place])  [place] tun th? lu?t dn quy?n hi?n hnh c?a Lin bang v khng phn bi?t ?i x? d?a trn ch?ng t?c, mu da, ngu?n g?c qu?c gia, ? tu?i, khuy?t t?t, ho?c gi?i tnh.     (Arabic)

## 2015-10-03 ENCOUNTER — Other Ambulatory Visit: Payer: Self-pay

## 2015-10-03 ENCOUNTER — Other Ambulatory Visit: Payer: Self-pay | Admitting: *Deleted

## 2015-10-03 NOTE — Patient Outreach (Signed)
[facility]) Care Management  10/03/2015  [name] 10-08-1945 [id number]   [name] made an attempt to try and contact patient today to follow-up regarding long-term care placement arrangements; however, patient was unavailable.  A HIPAA compliant message was left on voicemail.  [name] is currently awaiting a return call.  [name] is also awaiting a return call from patient's Primary Care Physician, Dr. Ladell Heads office regarding patient's completed and signed FL-2 Form.   [name], BSW, MSW, LCSW  Licensed Education officer, environmental Health System  Mailing [place] [address] [place], [place] [address] Physical Address-[address], [place],  [address] Toll Free Main # [phone] Fax # [phone] Cell # [phone]  Fax # [phone]  [name] complies with [employer] civil rights laws and does not discriminate on the basis of race, color, national origin, age, disability, or sex.  Espaol (Spanish)  [place] cumple con las leyes federales de derechos civiles aplicables y no discrimina por motivos de raza, color, nacionalidad, edad, discapacidad o sexo.     Ti?ng Vi?t ([place])  [place] tun th? lu?t dn quy?n hi?n hnh c?a Lin bang v khng phn bi?t ?i x? d?a trn ch?ng t?c, mu da, ngu?n g?c qu?c gia, ? tu?i, khuy?t t?t, ho?c gi?i tnh.     (Arabic)

## 2015-10-03 NOTE — Patient Outreach (Signed)
Transition of care call:  ADLS: Placed call to patient who reports that he is "Trying to wash up"  Reports that he is trying to clean himself with wipes but is not being very successful.   AMBULATIION AND APPOINTMENTS: Reports he is able to get up with walker but not very well. Reports that he has an appointment with primary MD [name] tomorrow at 11:45 am and an appointment with ortho Dr. Berenice Primas in the afternoon. Reports that he is going to call [facility] to see if he can book a ride. States that he can not get his wheelchair out of the house. Reports that he is going to ask Gerald Stabs, his friend, to come tonight and put wheelchair on porch for him. Reports that he has a ramp.  States that he will use his walker around the house. Patient reports that he has had 2 falls since he got home from the hospital 8 days ago. Reports that he has a history of passing out for a few seconds.  Patient reports that his friend Gerald Stabs has not been to see him recently and was out of town all weekend. Reports Gerald Stabs comes once a week.  Food: Patient states that he has Spam in a can and vienna sausages that he is eating. Reports that he can not find his can opener. States maybe it is on the floor.  Reports decreased appetite.  States that he has a microwave sitting beside his chair but is not using it very much.    DM: Reports today CBG of 384. Reports that he took 28 units of insulin and now CBG is 257.  Reports that he has all of his medications.  Patient reports he will talk to MD about blood sugar tomorrow.  PLAN: *Patient to see primary MD tomorrow.            [facility] Social worker assisting with placement.            *Scheduled home visit for 10/05/2015. Patient reports come into car port and door is unlocked. "Just come in"            *Message sent to [facility] social worker, to inquire about transportation to MD appointments tomorrow and about food assistance pending placement.  Will send this  note to MD prior to office visit tomorrow.  [facility] CM Care Plan Problem One        Most Recent Value   Care Plan Problem One  Recent hospital admission    Role Documenting the Problem One  Care Management [place] for Problem One  Active   THN Long Term Goal (31-90 days)  Patient will verbalize no readmission in the next 31 days   THN Long Term Goal Start Date  09/26/15   Interventions for Problem One Long Term Goal  DIfficult case discussion  on 09/27/2015, Home visit planned for 10/05/2015   [facility] CM Short Term Goal #1 (0-30 days)  Patient will see primary MD as planned on 10/04/2015   [facility] CM Short Term Goal #1 Start Date  09/26/15   Interventions for Short Term Goal #1  Placed call and in basket message to [facility] social worker for assistance with transportation.      [name], RN, BSN, CEN [facility] [employer] [phone]

## 2015-10-03 NOTE — Patient Outreach (Signed)
Care Coordination: Spoke with patient who reports that he is on the schedule for [facility]. Reports that he will go to Dr. Wonda Amis office and then be picked up and taken to Dr. Berenice Primas office. States he will have to wait for a few hours until his 3pm appointment with Dr. Berenice Primas.  Plan: Update send to Education officer, museum.            Home visit planned for 10/05/2015  [name], RN, BSN, [place] Coordinator [phone]

## 2015-10-04 ENCOUNTER — Other Ambulatory Visit: Payer: Self-pay | Admitting: *Deleted

## 2015-10-04 NOTE — Patient Outreach (Signed)
[facility]) Care Management  10/04/2015  [name] 1946/04/12 [id number]   [name] arrived at [facility] at 11:30am today for patient's appointment with his Primary Care Physician, [name], not realizing it had been cancelled.  [name] was able to speak with Dr. Delene Ruffini nurse, who reports that the appointment has been rescheduled for Wednesday, October 05, 2015 at 1:15pm.  [name] was able to cancel patient's transportation arrangements through [employer] for today and reschedule transport for tomorrow. [name] will try to make arrangements to attend patient's appointment, but had already rearranged scheduled to attend today's appointment.  [name] will converse with [name], RNCM with [employer], to report findings of today's chain of events.  [name] is aware that [name] was planning to meet with patient for the initial home visit on Wednesday, October 05, 2015 at 1:30pm, but will need to cancel this home visit and may decide to meet with patient at his office visit instead.  [name] will request that Mrs. Cook plan to attend the office visit if [name] is unable.  Dr. Delene Ruffini nurse reports that the completed and signed FL-2 Form will be available for pick-up, along with a copy of patient's most recent chest x-ray.  [name] will then take these documents to the Social Worker/Admissions Coordinator at [facility] for review and possible placement arrangements.  [name], BSW, MSW, LCSW  Licensed Education officer, environmental Health System  Mailing [place] [address], [place], [place] [address] Physical Address-[address], [place], [place] [address] Toll Free Main # [phone] Fax # [phone] Cell # [phone]  Fax # [phone]  [name] complies with [employer] civil rights  laws and does not discriminate on the basis of race, color, national origin, age, disability, or sex.  Espaol (Spanish)  [place] cumple con las leyes federales de derechos civiles aplicables y no discrimina por motivos de raza, color, nacionalidad, edad, discapacidad o sexo.     Ti?ng Vi?t ([place])  [place] tun th? lu?t dn quy?n hi?n hnh c?a Lin bang v khng phn bi?t ?i x? d?a trn ch?ng t?c, mu da, ngu?n g?c qu?c gia, ? tu?i, khuy?t t?t, ho?c gi?i tnh.     (Arabic)

## 2015-10-05 ENCOUNTER — Ambulatory Visit: Payer: Self-pay

## 2015-10-05 ENCOUNTER — Other Ambulatory Visit: Payer: Self-pay | Admitting: *Deleted

## 2015-10-05 NOTE — Patient Outreach (Signed)
[facility]) Care Management  10/05/2015  [name] 02/01/1946 [id number]   [name] received a voicemail message from [place], Dr. Ladell Heads Nurse (Patient's Primary Care Physician), reporting that patient called the office this morning to cancel his appointment scheduled for today at 1:15pm.  [name] reported that the appointment has been rescheduled for 10/13/2015 at 10:30am. [name] was able to make contact with patient today to inquire about his reasoning for cancelling his appointment with [name].  Patient indicated that he "had some business that needed to be taken care of".  [name] reminded patient of the importance of attending this appointment, not only to ensure that patient received his hospital follow-up appointment within 7-10 business days, but also to have [name] complete his FL-2 Form for emergency placement purposes. Patient apologized profusely, reporting that he had forgotten that he had scheduled this telephonic business call this morning when he rescheduled his appointment with [name] yesterday.  Patient went on to say that he is not sure that he wants to go to [facility], that there may be an assisted living facility that he likes better.  [name] voiced understanding, explaining to patient that he may actually have the ability to choose the facility of his choice.  However, there is no way to know for sure until we fax patient's completed and signed FL-2 Form to all facilities of interest and see which ones are willing to make bed offers. Patient admitted that he misunderstood what the Adult Materials engineer with the [facility], [name] was saying.  Patient was under the impression that he would be placed at [facility], directly from Dr. Delene Ruffini office today and transported by [facility].  [name] explained to patient that [name] was able to secure  patient a bed at [facility], in the event that emergency placement was indicated, but that as long as patient is willing to work with [facility] on placement arrangements, [name] would hold off on forcing placement of any kind. Patient appeared to be quite relieved to receive this information.  [name] reminded patient that [name] has been very accommodating to patient and [name] thus far and is simply working in the best interest of everyone involved. [name] attempted to contact patient's Adult IT consultant at the [facility], [name] today; however, she was unavailable.  A HIPAA compliant message was left on voicemail.  [name] is currently awaiting a return call. [name] has contacted patient's RNCM with [facility], [name] to report findings of phone conversations with patient, as well as voicemail message from [place] today.  [name] has contacted Bary Castilla, Surveyor, quantity with [facility] to request assistance and support with this case.  [name], BSW, MSW, LCSW  Licensed Education officer, environmental Health System  Mailing [place] [address], [place], [place] [address] Physical Address-[address], [place], [place] [address] Toll Free Main # [phone] Fax # [phone] Cell # [phone]  Fax # [phone]  [name] complies with [employer] civil rights laws and does not discriminate on the basis of race, color, national origin, age, disability, or sex.  Espaol (Spanish)  [place] cumple con las leyes federales de derechos civiles aplicables y no discrimina por motivos de raza, color, nacionalidad, edad, discapacidad o sexo.  Ti?ng Vi?t ([place])  [place] tun th? lu?t dn quy?n  hi?n hnh c?a Lin bang v khng phn bi?t ?i x? d?a trn ch?ng t?c, mu da, ngu?n g?c qu?c gia, ? tu?i, khuy?t t?t, ho?c gi?i tnh.     (Arabic)

## 2015-10-06 ENCOUNTER — Ambulatory Visit: Payer: Medicare Other | Admitting: *Deleted

## 2015-10-06 ENCOUNTER — Encounter: Payer: Self-pay | Admitting: General Practice

## 2015-10-06 NOTE — Patient Outreach (Signed)
[facility]) Care Management  10/06/2015  [name] 04/29/1946 [id number]   RN CM assisted [facility] Care Management team in care coordination with patient. Discussed patient case with RN CM Coordinator and [name] who are assigned to the patient in the community. Upon the patient missing two follow up primary provider office appointments, being a recent hospital discharge and his safety at home in question, RN CM placed a call to [name] at [facility] and spoke with Brian Media, RN of Dr. Laurann Wood. RN CM obtained verbal permission for [name] to assess the patient in the home and complete a FL2 to assist with Assisted Living Placement.  RN CM contacted [facility], # [phone] and spoke with Hassell Done, RN to arrange daily aide services in the home to assist with bathing and grooming of the patient. Per [name],  services will begin on the morning of 10/06/15. Epic discharge summary faxed to [facility] at # [phone]. Brian Lair, RN, [name] will meet the aide and RN from [facility] at the patient home for assessments. [name] and [name] will follow up and continue the plan for ALF placement.

## 2015-10-08 ENCOUNTER — Encounter (HOSPITAL_COMMUNITY): Payer: Self-pay | Admitting: Emergency Medicine

## 2015-10-08 ENCOUNTER — Inpatient Hospital Stay (HOSPITAL_COMMUNITY)
Admission: EM | Admit: 2015-10-08 | Discharge: 2015-10-14 | DRG: 291 | Disposition: A | Discharge status: Skilled Nursing Facility | Payer: Medicare Other | Attending: Internal Medicine | Admitting: Internal Medicine

## 2015-10-08 ENCOUNTER — Emergency Department (HOSPITAL_COMMUNITY): Payer: Medicare Other

## 2015-10-08 DIAGNOSIS — S8992XA Unspecified injury of left lower leg, initial encounter: Secondary | ICD-10-CM | POA: Diagnosis not present

## 2015-10-08 DIAGNOSIS — Z6841 Body Mass Index (BMI) 40.0 and over, adult: Secondary | ICD-10-CM

## 2015-10-08 DIAGNOSIS — B9561 Methicillin susceptible Staphylococcus aureus infection as the cause of diseases classified elsewhere: Secondary | ICD-10-CM | POA: Diagnosis present

## 2015-10-08 DIAGNOSIS — Z833 Family history of diabetes mellitus: Secondary | ICD-10-CM

## 2015-10-08 DIAGNOSIS — I2510 Atherosclerotic heart disease of native coronary artery without angina pectoris: Secondary | ICD-10-CM | POA: Diagnosis not present

## 2015-10-08 DIAGNOSIS — I455 Other specified heart block: Secondary | ICD-10-CM | POA: Diagnosis present

## 2015-10-08 DIAGNOSIS — I130 Hypertensive heart and chronic kidney disease with heart failure and stage 1 through stage 4 chronic kidney disease, or unspecified chronic kidney disease: Principal | ICD-10-CM | POA: Diagnosis present

## 2015-10-08 DIAGNOSIS — R52 Pain, unspecified: Secondary | ICD-10-CM | POA: Diagnosis not present

## 2015-10-08 DIAGNOSIS — I472 Ventricular tachycardia: Secondary | ICD-10-CM | POA: Diagnosis not present

## 2015-10-08 DIAGNOSIS — W19XXXA Unspecified fall, initial encounter: Secondary | ICD-10-CM | POA: Diagnosis not present

## 2015-10-08 DIAGNOSIS — I4729 Other ventricular tachycardia: Secondary | ICD-10-CM

## 2015-10-08 DIAGNOSIS — N390 Urinary tract infection, site not specified: Secondary | ICD-10-CM | POA: Diagnosis present

## 2015-10-08 DIAGNOSIS — Z9289 Personal history of other medical treatment: Secondary | ICD-10-CM

## 2015-10-08 DIAGNOSIS — I5033 Acute on chronic diastolic (congestive) heart failure: Secondary | ICD-10-CM | POA: Diagnosis not present

## 2015-10-08 DIAGNOSIS — R0602 Shortness of breath: Secondary | ICD-10-CM | POA: Diagnosis not present

## 2015-10-08 DIAGNOSIS — G8929 Other chronic pain: Secondary | ICD-10-CM | POA: Diagnosis present

## 2015-10-08 DIAGNOSIS — N189 Chronic kidney disease, unspecified: Secondary | ICD-10-CM | POA: Diagnosis present

## 2015-10-08 DIAGNOSIS — E1140 Type 2 diabetes mellitus with diabetic neuropathy, unspecified: Secondary | ICD-10-CM | POA: Diagnosis not present

## 2015-10-08 DIAGNOSIS — Z96651 Presence of right artificial knee joint: Secondary | ICD-10-CM | POA: Diagnosis present

## 2015-10-08 DIAGNOSIS — Z7982 Long term (current) use of aspirin: Secondary | ICD-10-CM

## 2015-10-08 DIAGNOSIS — IMO0002 Reserved for concepts with insufficient information to code with codable children: Secondary | ICD-10-CM | POA: Diagnosis present

## 2015-10-08 DIAGNOSIS — Z885 Allergy status to narcotic agent status: Secondary | ICD-10-CM

## 2015-10-08 DIAGNOSIS — R079 Chest pain, unspecified: Secondary | ICD-10-CM | POA: Diagnosis not present

## 2015-10-08 DIAGNOSIS — Y92009 Unspecified place in unspecified non-institutional (private) residence as the place of occurrence of the external cause: Secondary | ICD-10-CM

## 2015-10-08 DIAGNOSIS — L8990 Pressure ulcer of unspecified site, unspecified stage: Secondary | ICD-10-CM | POA: Diagnosis present

## 2015-10-08 DIAGNOSIS — E1159 Type 2 diabetes mellitus with other circulatory complications: Secondary | ICD-10-CM

## 2015-10-08 DIAGNOSIS — M25512 Pain in left shoulder: Secondary | ICD-10-CM | POA: Diagnosis not present

## 2015-10-08 DIAGNOSIS — R1084 Generalized abdominal pain: Secondary | ICD-10-CM | POA: Diagnosis not present

## 2015-10-08 DIAGNOSIS — K219 Gastro-esophageal reflux disease without esophagitis: Secondary | ICD-10-CM | POA: Diagnosis present

## 2015-10-08 DIAGNOSIS — E1143 Type 2 diabetes mellitus with diabetic autonomic (poly)neuropathy: Secondary | ICD-10-CM | POA: Diagnosis present

## 2015-10-08 DIAGNOSIS — T83518A Infection and inflammatory reaction due to other urinary catheter, initial encounter: Secondary | ICD-10-CM | POA: Diagnosis present

## 2015-10-08 DIAGNOSIS — Z794 Long term (current) use of insulin: Secondary | ICD-10-CM

## 2015-10-08 DIAGNOSIS — E1122 Type 2 diabetes mellitus with diabetic chronic kidney disease: Secondary | ICD-10-CM | POA: Diagnosis not present

## 2015-10-08 DIAGNOSIS — Z806 Family history of leukemia: Secondary | ICD-10-CM

## 2015-10-08 DIAGNOSIS — Z888 Allergy status to other drugs, medicaments and biological substances status: Secondary | ICD-10-CM

## 2015-10-08 DIAGNOSIS — Z79899 Other long term (current) drug therapy: Secondary | ICD-10-CM

## 2015-10-08 DIAGNOSIS — R001 Bradycardia, unspecified: Secondary | ICD-10-CM | POA: Diagnosis present

## 2015-10-08 DIAGNOSIS — I5032 Chronic diastolic (congestive) heart failure: Secondary | ICD-10-CM | POA: Diagnosis not present

## 2015-10-08 DIAGNOSIS — M25562 Pain in left knee: Secondary | ICD-10-CM | POA: Diagnosis not present

## 2015-10-08 DIAGNOSIS — Z87891 Personal history of nicotine dependence: Secondary | ICD-10-CM | POA: Diagnosis not present

## 2015-10-08 DIAGNOSIS — R55 Syncope and collapse: Secondary | ICD-10-CM | POA: Diagnosis present

## 2015-10-08 DIAGNOSIS — I509 Heart failure, unspecified: Secondary | ICD-10-CM

## 2015-10-08 DIAGNOSIS — E785 Hyperlipidemia, unspecified: Secondary | ICD-10-CM | POA: Diagnosis present

## 2015-10-08 DIAGNOSIS — Z8043 Family history of malignant neoplasm of testis: Secondary | ICD-10-CM

## 2015-10-08 DIAGNOSIS — R262 Difficulty in walking, not elsewhere classified: Secondary | ICD-10-CM | POA: Diagnosis present

## 2015-10-08 DIAGNOSIS — Z8701 Personal history of pneumonia (recurrent): Secondary | ICD-10-CM

## 2015-10-08 DIAGNOSIS — L309 Dermatitis, unspecified: Secondary | ICD-10-CM | POA: Diagnosis present

## 2015-10-08 DIAGNOSIS — B965 Pseudomonas (aeruginosa) (mallei) (pseudomallei) as the cause of diseases classified elsewhere: Secondary | ICD-10-CM | POA: Diagnosis present

## 2015-10-08 DIAGNOSIS — Z8673 Personal history of transient ischemic attack (TIA), and cerebral infarction without residual deficits: Secondary | ICD-10-CM

## 2015-10-08 DIAGNOSIS — L97419 Non-pressure chronic ulcer of right heel and midfoot with unspecified severity: Secondary | ICD-10-CM | POA: Diagnosis present

## 2015-10-08 DIAGNOSIS — Z86718 Personal history of other venous thrombosis and embolism: Secondary | ICD-10-CM

## 2015-10-08 DIAGNOSIS — Z881 Allergy status to other antibiotic agents status: Secondary | ICD-10-CM

## 2015-10-08 DIAGNOSIS — Z8249 Family history of ischemic heart disease and other diseases of the circulatory system: Secondary | ICD-10-CM

## 2015-10-08 DIAGNOSIS — M545 Low back pain: Secondary | ICD-10-CM | POA: Diagnosis present

## 2015-10-08 DIAGNOSIS — Z960 Presence of urogenital implants: Secondary | ICD-10-CM

## 2015-10-08 DIAGNOSIS — N319 Neuromuscular dysfunction of bladder, unspecified: Secondary | ICD-10-CM | POA: Diagnosis present

## 2015-10-08 DIAGNOSIS — I878 Other specified disorders of veins: Secondary | ICD-10-CM | POA: Diagnosis present

## 2015-10-08 DIAGNOSIS — I872 Venous insufficiency (chronic) (peripheral): Secondary | ICD-10-CM | POA: Diagnosis present

## 2015-10-08 DIAGNOSIS — E1165 Type 2 diabetes mellitus with hyperglycemia: Secondary | ICD-10-CM | POA: Diagnosis present

## 2015-10-08 DIAGNOSIS — Y846 Urinary catheterization as the cause of abnormal reaction of the patient, or of later complication, without mention of misadventure at the time of the procedure: Secondary | ICD-10-CM | POA: Diagnosis present

## 2015-10-08 DIAGNOSIS — E11621 Type 2 diabetes mellitus with foot ulcer: Secondary | ICD-10-CM | POA: Diagnosis present

## 2015-10-08 DIAGNOSIS — Z978 Presence of other specified devices: Secondary | ICD-10-CM

## 2015-10-08 DIAGNOSIS — G4733 Obstructive sleep apnea (adult) (pediatric): Secondary | ICD-10-CM | POA: Diagnosis present

## 2015-10-08 DIAGNOSIS — E1151 Type 2 diabetes mellitus with diabetic peripheral angiopathy without gangrene: Secondary | ICD-10-CM | POA: Diagnosis present

## 2015-10-08 LAB — GLUCOSE, CAPILLARY
GLUCOSE-CAPILLARY: 223 mg/dL — AB (ref 65–99)
GLUCOSE-CAPILLARY: 189 mg/dL — AB (ref 65–99)

## 2015-10-08 LAB — TROPONIN I: Troponin I: <0.03 ng/mL (ref <0.031)

## 2015-10-08 LAB — CBC WITH DIFFERENTIAL/PLATELET
BASOS ABS: 0.1 10*3/uL (ref 0.0–0.1)
Basophils Relative: 1 %
EOS ABS: 0.8 10*3/uL — AB (ref 0.0–0.7)
EOS PCT: 8 %
HCT: 37.7 % — ABNORMAL LOW (ref 39.0–52.0)
HEMOGLOBIN: 12.6 g/dL — AB (ref 13.0–17.0)
LYMPHS ABS: 1.6 10*3/uL (ref 0.7–4.0)
Lymphocytes Relative: 16 %
MCH: 30.5 pg (ref 26.0–34.0)
MCHC: 33.4 g/dL (ref 30.0–36.0)
MCV: 91.3 fL (ref 78.0–100.0)
Monocytes Absolute: 0.8 10*3/uL (ref 0.1–1.0)
Monocytes Relative: 8 %
NEUTROS PCT: 67 %
Neutro Abs: 7 10*3/uL (ref 1.7–7.7)
PLATELETS: 187 10*3/uL (ref 150–400)
RBC: 4.13 MIL/uL — AB (ref 4.22–5.81)
RDW: 13.7 % (ref 11.5–15.5)
WBC: 10.3 10*3/uL (ref 4.0–10.5)

## 2015-10-08 LAB — COMPREHENSIVE METABOLIC PANEL
ALBUMIN: 2.9 g/dL — AB (ref 3.5–5.0)
ALK PHOS: 79 U/L (ref 38–126)
ALT: 10 U/L — AB (ref 17–63)
AST: 14 U/L — AB (ref 15–41)
Anion gap: 10 (ref 5–15)
BUN: 11 mg/dL (ref 6–20)
CALCIUM: 8.5 mg/dL — AB (ref 8.9–10.3)
CO2: 25 mmol/L (ref 22–32)
CREATININE: 0.69 mg/dL (ref 0.61–1.24)
Chloride: 102 mmol/L (ref 101–111)
GFR calc non Af Amer: >60 mL/min (ref >60)
GLUCOSE: 268 mg/dL — AB (ref 65–99)
Potassium: 3.9 mmol/L (ref 3.5–5.1)
SODIUM: 137 mmol/L (ref 135–145)
Total Bilirubin: 0.5 mg/dL (ref 0.3–1.2)
Total Protein: 6.2 g/dL — ABNORMAL LOW (ref 6.5–8.1)

## 2015-10-08 LAB — CBC
HEMATOCRIT: 35.2 % — AB (ref 39.0–52.0)
HEMOGLOBIN: 11.8 g/dL — AB (ref 13.0–17.0)
MCH: 29.9 pg (ref 26.0–34.0)
MCHC: 33.5 g/dL (ref 30.0–36.0)
MCV: 89.3 fL (ref 78.0–100.0)
Platelets: 183 10*3/uL (ref 150–400)
RBC: 3.94 MIL/uL — AB (ref 4.22–5.81)
RDW: 13.4 % (ref 11.5–15.5)
WBC: 9.6 10*3/uL (ref 4.0–10.5)

## 2015-10-08 LAB — CREATININE, SERUM
CREATININE: 0.65 mg/dL (ref 0.61–1.24)
GFR calc Af Amer: >60 mL/min (ref >60)
GFR calc non Af Amer: >60 mL/min (ref >60)

## 2015-10-08 LAB — LIPASE, BLOOD: Lipase: 20 U/L (ref 11–51)

## 2015-10-08 LAB — MRSA PCR SCREENING: MRSA by PCR: POSITIVE — AB

## 2015-10-08 LAB — LACTIC ACID, PLASMA: Lactic Acid, Venous: 1.6 mmol/L (ref 0.5–2.0)

## 2015-10-08 MED ORDER — EZETIMIBE 10 MG PO TABS
10.0000 mg | ORAL_TABLET | Freq: Every day | ORAL | Status: DC
Start: 2015-10-08 — End: 2015-10-13
  Administered 2015-10-08 – 2015-10-13 (×5): 10 mg via ORAL
  Filled 2015-10-08 (×6): qty 1

## 2015-10-08 MED ORDER — HYDROMORPHONE HCL 1 MG/ML IJ SOLN
1.0000 mg | Freq: Once | INTRAMUSCULAR | Status: AC
  Administered 2015-10-08: 1 mg via INTRAVENOUS
  Filled 2015-10-08: qty 1

## 2015-10-08 MED ORDER — SODIUM CHLORIDE 0.9 % IV SOLN: 250.0000 mL | INTRAVENOUS | Status: DC | PRN

## 2015-10-08 MED ORDER — INSULIN ASPART 100 UNIT/ML ~~LOC~~ SOLN
0.0000 [IU] | Freq: Three times a day (TID) | SUBCUTANEOUS | Status: DC
  Administered 2015-10-08: 4 [IU] via SUBCUTANEOUS
  Administered 2015-10-10: 3 [IU] via SUBCUTANEOUS
  Administered 2015-10-10: 4 [IU] via SUBCUTANEOUS
  Administered 2015-10-11 – 2015-10-12 (×3): 3 [IU] via SUBCUTANEOUS
  Administered 2015-10-12 – 2015-10-13 (×3): 4 [IU] via SUBCUTANEOUS
  Administered 2015-10-13 – 2015-10-14 (×3): 3 [IU] via SUBCUTANEOUS

## 2015-10-08 MED ORDER — SENNOSIDES-DOCUSATE SODIUM 8.6-50 MG PO TABS
2.0000 | ORAL_TABLET | Freq: Two times a day (BID) | ORAL | Status: DC | PRN
  Administered 2015-10-10 – 2015-10-13 (×2): 2 via ORAL
  Filled 2015-10-08 (×2): qty 2

## 2015-10-08 MED ORDER — ONDANSETRON HCL 4 MG/2ML IJ SOLN
4.0000 mg | Freq: Four times a day (QID) | INTRAMUSCULAR | Status: DC | PRN
  Administered 2015-10-10: 4 mg via INTRAVENOUS
  Filled 2015-10-08: qty 2

## 2015-10-08 MED ORDER — INSULIN NPH (HUMAN) (ISOPHANE) 100 UNIT/ML ~~LOC~~ SUSP
28.0000 [IU] | Freq: Two times a day (BID) | SUBCUTANEOUS | Status: DC
  Administered 2015-10-08 – 2015-10-14 (×11): 28 [IU] via SUBCUTANEOUS
  Filled 2015-10-08: qty 10

## 2015-10-08 MED ORDER — DIPHENOXYLATE-ATROPINE 2.5-0.025 MG PO TABS: 1.0000 | ORAL_TABLET | Freq: Three times a day (TID) | ORAL | Status: DC | PRN

## 2015-10-08 MED ORDER — ALUM & MAG HYDROXIDE-SIMETH 200-200-20 MG/5ML PO SUSP
30.0000 mL | Freq: Four times a day (QID) | ORAL | Status: DC | PRN
  Administered 2015-10-09: 30 mL via ORAL
  Filled 2015-10-08 (×2): qty 30

## 2015-10-08 MED ORDER — FUROSEMIDE 40 MG PO TABS
40.0000 mg | ORAL_TABLET | ORAL | Status: DC
  Administered 2015-10-10 – 2015-10-12 (×2): 40 mg via ORAL
  Filled 2015-10-08 (×2): qty 1

## 2015-10-08 MED ORDER — ONDANSETRON HCL 4 MG/2ML IJ SOLN
4.0000 mg | Freq: Once | INTRAMUSCULAR | Status: AC
  Administered 2015-10-08: 4 mg via INTRAVENOUS
  Filled 2015-10-08: qty 2

## 2015-10-08 MED ORDER — ASPIRIN EC 81 MG PO TBEC
81.0000 mg | DELAYED_RELEASE_TABLET | Freq: Every morning | ORAL | Status: DC
  Administered 2015-10-08 – 2015-10-14 (×7): 81 mg via ORAL
  Filled 2015-10-08 (×7): qty 1

## 2015-10-08 MED ORDER — ONDANSETRON HCL 4 MG PO TABS: 4.0000 mg | ORAL_TABLET | Freq: Four times a day (QID) | ORAL | Status: DC | PRN

## 2015-10-08 MED ORDER — NITROGLYCERIN 0.4 MG SL SUBL: 0.4000 mg | SUBLINGUAL_TABLET | SUBLINGUAL | Status: DC | PRN

## 2015-10-08 MED ORDER — SODIUM CHLORIDE 0.9% FLUSH: 3.0000 mL | INTRAVENOUS | Status: DC | PRN

## 2015-10-08 MED ORDER — SODIUM CHLORIDE 0.9% FLUSH
3.0000 mL | Freq: Two times a day (BID) | INTRAVENOUS | Status: DC
  Administered 2015-10-11 – 2015-10-14 (×3): 3 mL via INTRAVENOUS

## 2015-10-08 MED ORDER — OXYBUTYNIN CHLORIDE 5 MG PO TABS: 5.0000 mg | ORAL_TABLET | Freq: Every day | ORAL | Status: DC | PRN

## 2015-10-08 MED ORDER — PANTOPRAZOLE SODIUM 40 MG PO TBEC
40.0000 mg | DELAYED_RELEASE_TABLET | Freq: Every day | ORAL | Status: DC
  Administered 2015-10-08 – 2015-10-14 (×7): 40 mg via ORAL
  Filled 2015-10-08 (×7): qty 1

## 2015-10-08 MED ORDER — ISOSORBIDE MONONITRATE ER 60 MG PO TB24
60.0000 mg | ORAL_TABLET | Freq: Every day | ORAL | Status: DC
  Administered 2015-10-08 – 2015-10-14 (×7): 60 mg via ORAL
  Filled 2015-10-08 (×7): qty 1

## 2015-10-08 MED ORDER — TRAMADOL HCL 50 MG PO TABS
50.0000 mg | ORAL_TABLET | Freq: Two times a day (BID) | ORAL | Status: DC | PRN
  Administered 2015-10-08 (×2): 50 mg via ORAL
  Administered 2015-10-09 – 2015-10-14 (×8): 100 mg via ORAL
  Filled 2015-10-08 (×5): qty 2
  Filled 2015-10-08: qty 1
  Filled 2015-10-08 (×2): qty 2
  Filled 2015-10-08: qty 1
  Filled 2015-10-08: qty 2

## 2015-10-08 MED ORDER — MUPIROCIN 2 % EX OINT
1.0000 "application " | TOPICAL_OINTMENT | Freq: Two times a day (BID) | CUTANEOUS | Status: AC
  Administered 2015-10-09 – 2015-10-13 (×10): 1 via NASAL
  Filled 2015-10-08: qty 22

## 2015-10-08 MED ORDER — LORATADINE 10 MG PO TABS
10.0000 mg | ORAL_TABLET | Freq: Every day | ORAL | Status: DC
  Administered 2015-10-08 – 2015-10-14 (×7): 10 mg via ORAL
  Filled 2015-10-08 (×7): qty 1

## 2015-10-08 MED ORDER — ENOXAPARIN SODIUM 40 MG/0.4ML ~~LOC~~ SOLN: 40.0000 mg | SUBCUTANEOUS | Status: DC

## 2015-10-08 MED ORDER — ACETAMINOPHEN 325 MG PO TABS: 650.0000 mg | ORAL_TABLET | ORAL | Status: DC | PRN

## 2015-10-08 MED ORDER — INSULIN ASPART 100 UNIT/ML ~~LOC~~ SOLN: 0.0000 [IU] | Freq: Every day | SUBCUTANEOUS | Status: DC

## 2015-10-08 MED ORDER — ENOXAPARIN SODIUM 80 MG/0.8ML ~~LOC~~ SOLN
70.0000 mg | Freq: Every day | SUBCUTANEOUS | Status: DC
  Administered 2015-10-08 – 2015-10-09 (×2): 70 mg via SUBCUTANEOUS
  Filled 2015-10-08 (×2): qty 0.8

## 2015-10-08 MED ORDER — LISINOPRIL 5 MG PO TABS
5.0000 mg | ORAL_TABLET | Freq: Every day | ORAL | Status: DC
  Administered 2015-10-08 – 2015-10-14 (×7): 5 mg via ORAL
  Filled 2015-10-08 (×7): qty 1

## 2015-10-08 MED ORDER — METOPROLOL TARTRATE 25 MG PO TABS
25.0000 mg | ORAL_TABLET | Freq: Two times a day (BID) | ORAL | Status: DC
  Administered 2015-10-08 – 2015-10-14 (×13): 25 mg via ORAL
  Filled 2015-10-08 (×13): qty 1

## 2015-10-08 MED ORDER — SODIUM CHLORIDE 0.9% FLUSH
3.0000 mL | Freq: Two times a day (BID) | INTRAVENOUS | Status: DC
  Administered 2015-10-08 – 2015-10-14 (×11): 3 mL via INTRAVENOUS

## 2015-10-08 MED ORDER — CHLORHEXIDINE GLUCONATE CLOTH 2 % EX PADS
6.0000 | MEDICATED_PAD | Freq: Every day | CUTANEOUS | Status: AC
  Administered 2015-10-08 – 2015-10-13 (×5): 6 via TOPICAL

## 2015-10-08 NOTE — ED Notes (Signed)
Pt. Back to room from Xray.

## 2015-10-08 NOTE — H&P (Addendum)
History and Physical:    [name]   [id number] DOB: 1946/02/01 DOA: 10/08/2015  Referring MD/provider: Dr. Ralene Bathe PCP: Irven Shelling, MD   Chief Complaint: Fall with left knee pain and inability to ambulate  History of Present Illness:   [name] is an 70 y.o. male with a PMH of morbid obesity, hypertension, diabetic neuropathy, neurogenic bladder with chronic indwelling Foley, prior DVT who presents today after suffering from a mechanical fall at home. Although he thinks he tripped over the threshold while using his walker, he is unsure if he passed out. Describes having frequent spells where he loses time. He fell onto his left side and had left knee pain following the fall with inability to ambulate. He also reported some left shoulder pain. Initially described the pain as 8/10. Also reports upper abdominal pain that was present prior to his fall and, but has worsened since the fall as well as one episode of emesis. No recent fevers. Patient has had 6 inpatient hospitalizations, many with similar issues and has been resistant to nursing home placement in the past. Is clear that he cannot adequately care for himself at home. He is largely chair bound. A radiographic evaluation was negative for any fracture. We were asked to admit the patient secondary to his current complaints of pain and his inability to ambulate and therefore care for himself in the home. Of note, the patient has been under the care of [facility]/social work at home, and Adult [employer] were involved in attempting to get the patient placed due to his inability to adequately care for himself.   ROS:   Review of Systems  Constitutional: Positive for chills and weight loss. Negative for fever.       100 lb wt loss over 8 months  HENT: Negative.        "Dry throat"  Eyes: Negative.   Respiratory: Positive for cough, hemoptysis, sputum production and shortness of breath.   Cardiovascular: Positive  for chest pain, palpitations, orthopnea and leg swelling. Negative for PND.       Described as pressure, last used NTG about 1 month ago  Gastrointestinal: Positive for nausea, vomiting and abdominal pain. Negative for heartburn, diarrhea, constipation, blood in stool and melena.       Dry heaves, no emesis  Genitourinary: Negative.        Chronic indwelling foley catheter  Musculoskeletal: Positive for joint pain and falls.  Neurological: Positive for dizziness, loss of consciousness and weakness. Negative for seizures and headaches.       Feels like he loses time..."something is just not right".  Endo/Heme/Allergies: Negative for environmental allergies. Does not bruise/bleed easily.  Psychiatric/Behavioral: Positive for substance abuse. Negative for depression and memory loss. The patient is not nervous/anxious.        H/O alcoholism, no use since 60.     Past Medical History:   Past Medical History  Diagnosis Date  . Obese   . Hypertension   . Diabetic neuropathy, painful ([place])   . Chronic indwelling Foley catheter   . Bladder spasms   . Hyperlipidemia   . GERD (gastroesophageal reflux disease)   . Neurogenic bladder   . CAD (coronary artery disease)   . PONV (postoperative nausea and vomiting)   . CHF (congestive heart failure) ([place])   . DVT (deep venous thrombosis) (HCC) 1980's    LLE  . Type II diabetes mellitus ([place])   . Diabetic diarrhea ([place])   .  Pneumonia 1999  . OSA (obstructive sleep apnea)     "they wanted me to wear a mask; I couldn't" (11/10/2014)  . Arthritis     "hands, ankles, knees" (11/10/2014)  . Chronic lower back pain   . Stroke [facility]) 10/2011; 06/2014    right hand numbness/notes 10/20/2011, pt not sure he had stroke in 2013; "right hand weaker and right face not quite right since" (11/10/2014)    Past Surgical History:   Past Surgical History  Procedure Laterality Date  . Gastroplasty    . Total knee arthroplasty Right 1990's  . Left heart  catheterization with coronary angiogram N/A 10/24/2011    Procedure: LEFT HEART CATHETERIZATION WITH CORONARY ANGIOGRAM;  Surgeon: Candee Furbish, MD;  [facility] LAB;  Service: Cardiovascular;  Laterality: N/A;  right radial artery approach  . Cholecystectomy open  1970's?  . Joint replacement    . Carpal tunnel release Bilateral ~ 2003-2004  . Cardiac catheterization N/A 05/02/2015    Procedure: Left Heart Cath and Coronary Angiography;  Surgeon: Lorretta Harp, MD;  Location: [facility] CV LAB;  Service: Cardiovascular;  Laterality: N/A;  . Cardiac catheterization N/A 05/04/2015    Procedure: Left Heart Cath and Coronary Angiography;  Surgeon: Wellington Hampshire, MD;  Location: [facility] CV LAB;  Service: Cardiovascular;  Laterality: N/A;    Social History:   Social History   Social History  . Marital Status: Widowed    Spouse Name: N/A  . Number of Children: N/A  . Years of Education: N/A   Occupational History  . Retired    Social History Main Topics  . Smoking status: Former Smoker -- 2.00 packs/day for 20 years    Types: Cigarettes    Quit date: 09/10/1976  . Smokeless tobacco: Never Used  . 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 Drug Use: No  . Sexual Activity: No   Other Topics Concern  . Not on file   Social History Narrative   Lives alone.  No close family nearby.      Family history:   Family History  Problem Relation Age of Onset  . Hypertension Mother   . Diabetes type I Father   . Cancer Brother      Testicular Cancer  . Cancer Brother     Leukemia    Allergies   Ace inhibitors; Lipitor; Metformin and related; Cefadroxil; Cephalexin; Morphine and related; and Robaxin  Current Medications:   Prior to Admission medications   Medication Sig Start Date End Date Taking? Authorizing Provider  acetaminophen (TYLENOL) 325 MG tablet Take 2 tablets (650 mg total) by mouth every 4 (four) hours as needed for  headache or mild pain. 12/08/14  Yes Erlene Quan, PA-C  aspirin EC 81 MG tablet Take 81 mg by mouth every morning.    Yes Historical Provider, MD  diphenoxylate-atropine (LOMOTIL) 2.5-0.025 MG tablet Take 1 tablet by mouth 3 (three) times daily as needed for diarrhea or loose stools.  06/14/15  Yes Historical Provider, MD  ezetimibe (ZETIA) 10 MG tablet Take 1 tablet (10 mg total) by mouth daily. 05/06/15  Yes Rhonda G Barrett, PA-C  fexofenadine (ALLEGRA) 180 MG tablet Take 180 mg by mouth daily as needed for allergies or rhinitis.   Yes Historical Provider, MD  furosemide (LASIX) 40 MG tablet Take 1 tablet (40 mg total) by mouth every Monday, Wednesday, and Friday. 09/25/15  Yes [name], MD  insulin  NPH Human (HUMULIN N) 100 UNIT/ML injection Inject 0.28 mLs (28 Units total) into the skin 2 (two) times daily at 8 am and 10 pm. Patient taking differently: Inject 20-30 Units into the skin 2 (two) times daily at 8 am and 10 pm.  09/12/15  Yes Nishant Dhungel, MD  isosorbide mononitrate (IMDUR) 60 MG 24 hr tablet Take 1 tablet (60 mg total) by mouth daily. 07/06/15  Yes Hosie Poisson, MD  lisinopril (PRINIVIL,ZESTRIL) 5 MG tablet Take 5 mg by mouth daily. 06/23/15  Yes Historical Provider, MD  metoprolol (LOPRESSOR) 50 MG tablet Take 25 mg by mouth 2 (two) times daily.  09/21/15  Yes Historical Provider, MD  nitroGLYCERIN (NITROSTAT) 0.4 MG SL tablet Place 1 tablet (0.4 mg total) under the tongue every 5 (five) minutes as needed for chest pain. 12/08/14  Yes Luke K Kilroy, PA-C  omeprazole (PRILOSEC) 20 MG capsule Take 20 mg by mouth daily.   Yes Historical Provider, MD  oxybutynin (DITROPAN) 5 MG tablet Take 5-15 mg by mouth daily as needed for bladder spasms.    Yes Historical Provider, MD  senna-docusate (SENOKOT-S) 8.6-50 MG per tablet Take 2 tablets by mouth daily. Patient taking differently: Take 2 tablets by mouth 2 (two) times daily as needed for moderate constipation.  05/11/15  Yes Carmin Muskrat, MD   traMADol (ULTRAM) 50 MG tablet Take 50-100 mg by mouth 2 (two) times daily as needed for moderate pain.  09/21/15  Yes Historical Provider, MD  Chlorhexidine Gluconate Cloth 2 % PADS Apply 6 each topically daily at 6 (six) AM. Patient not taking: Reported on 09/26/2015 09/25/15   [name], MD  magnesium oxide (MAG-OX) 400 (241.3 Mg) MG tablet Take 1 tablet (400 mg total) by mouth daily. Patient not taking: Reported on 09/26/2015 09/15/15   Nat Math, MD  mupirocin cream (BACTROBAN) 2 % Apply topically 2 (two) times daily. Patient not taking: Reported on 10/08/2015 09/25/15   [name], MD  rosuvastatin (CRESTOR) 20 MG tablet Take 1 tablet (20 mg total) by mouth daily at 6 PM. Patient not taking: Reported on 09/22/2015 07/06/15   Hosie Poisson, MD    Physical Exam:   Filed Vitals:   10/08/15 0900 10/08/15 0939 10/08/15 0946 10/08/15 1137  BP: 142/70  155/74 147/94  Pulse: 95  96 85  Temp:   97.6 F (36.4 C)   TempSrc:   Oral   Resp: 10  16   Height:  6\' 1"  (1.854 m)    Weight:  164.384 kg (362 lb 6.4 oz)    SpO2: 93%  97%      Physical Exam: Blood pressure 147/94, pulse 85, temperature 97.6 F (36.4 C), temperature source Oral, resp. rate 16, height 6\' 1"  (1.854 m), weight 164.384 kg (362 lb 6.4 oz), SpO2 97 %. Gen: No acute distress. Morbidly obese male lying comfortably in bed.  Head: Normocephalic, atraumatic. Eyes: PERRL, EOMI, sclerae nonicteric. Mouth: Oropharynx is dry, clear. Neck: Supple, no thyromegaly, no lymphadenopathy, no jugular venous distention. Chest: Lungsclear to auscultation bilaterally.  CV: Heart sounds are regular with a III/VI SEM. Abdomen: Soft, nontender, nondistended with normal active bowel sounds. Extremities: Extremities show BLE erythema and 3+ swelling, dry skin with multiple abrasions. Skin: Warm and dry. LEs described above.  Diabetic heel ulcer right heel, dime sized. Neuro: Alert and oriented times 3. Cranial nerves grossly nonfocal.  Decreased  sensation below knees. Psych: Mood and affect normal.   Data Review:    Labs: Basic Metabolic  Panel:  Recent Labs Lab 10/08/15 0549 10/08/15 1014  NA 137  --   K 3.9  --   CL 102  --   CO2 25  --   GLUCOSE 268*  --   BUN 11  --   CREATININE 0.69 0.65  CALCIUM 8.5*  --    Liver Function Tests:  Recent Labs Lab 10/08/15 0549  AST 14*  ALT 10*  ALKPHOS 79  BILITOT 0.5  PROT 6.2*  ALBUMIN 2.9*    Recent Labs Lab 10/08/15 0549  LIPASE 20   No results for input(s): AMMONIA in the last 168 hours. CBC:  Recent Labs Lab 10/08/15 0549 10/08/15 1014  WBC 10.3 9.6  NEUTROABS 7.0  --   HGB 12.6* 11.8*  HCT 37.7* 35.2*  MCV 91.3 89.3  PLT 187 183   Cardiac Enzymes:  Recent Labs Lab 10/08/15 0549 10/08/15 1014  TROPONINI <0.03 <0.03    BNP (last 3 results) No results for input(s): PROBNP in the last 8760 hours. CBG:  Recent Labs Lab 10/08/15 1116  GLUCAP 223*    Radiographic Studies: Dg Shoulder Left  10/08/2015  CLINICAL DATA:  Status post fall while going through doorway, with left shoulder pain. Initial encounter. EXAM: LEFT SHOULDER - 2+ VIEW COMPARISON:  Left shoulder radiographs performed 04/15/2015 FINDINGS: There is no evidence of fracture or dislocation. The left humeral head is seated within the glenoid fossa. Mild degenerative change is noted at the left acromioclavicular joint. No significant soft tissue abnormalities are seen. Atelectasis is noted within the visualized portions of the lungs. IMPRESSION: No evidence of fracture or dislocation. Electronically Signed   By: Garald Balding M.D.   On: 10/08/2015 05:26   Dg Knee Complete 4 Views Left  10/08/2015  CLINICAL DATA:  Status post fall while going through doorway, with left knee pain. Initial encounter. EXAM: LEFT KNEE - COMPLETE 4+ VIEW COMPARISON:  Left knee radiographs performed 09/23/2015 FINDINGS: There is no evidence of fracture or dislocation. Lateral compartment narrowing is  noted, with marginal osteophyte formation at the lateral and patellofemoral compartments. Wall osteophytes are seen. No significant joint effusion is seen. The visualized soft tissues are normal in appearance. IMPRESSION: 1. No evidence of fracture or dislocation. 2. Mild osteoarthritis, with narrowing of the lateral compartment. Electronically Signed   By: Garald Balding M.D.   On: 10/08/2015 05:27   Dg Abd Acute W/chest  10/08/2015  CLINICAL DATA:  Acute onset of generalized abdominal pain status post fall. Initial encounter. EXAM: DG ABDOMEN ACUTE W/ 1V CHEST COMPARISON:  CT of the chest performed 09/07/2015, and CT of the abdomen and pelvis from 05/12/2015 FINDINGS: The lungs are well-aerated. Vascular congestion is noted. There is no evidence of focal opacification, pleural effusion or pneumothorax. The cardiomediastinal silhouette is borderline normal in size. The visualized bowel gas pattern is unremarkable. Scattered stool and air are seen within the colon; there is no evidence of small bowel dilatation to suggest obstruction. No free intra-abdominal air is identified on the provided decubitus view. Postoperative change is noted at the upper abdomen, with a bowel suture line at the left upper quadrant. No acute osseous abnormalities are seen. Diffuse joint space narrowing is noted at the right hip, with associated sclerosis. IMPRESSION: 1. Unremarkable bowel gas pattern; no free intra-abdominal air seen. Moderate amount of stool noted in the colon. 2. Vascular congestion.  Lungs remain grossly clear. 3. Diffuse joint space narrowing at the right hip, with associated sclerosis. Electronically Signed  By: Garald Balding M.D.   On: 10/08/2015 05:25   *I have personally reviewed the images above*  EKG: Independently reviewed. Unsustained ventricular bigeminy, prolonged PR.   Assessment/Plan:   Principal Problem:   Fall with possible syncope, Unable to walk, left knee pain status post fall - Admit  to telemetry and monitor cardiac rhythm. EKG not entirely normal. May be at risk for V. tach.  -  last 2-D echo done 07/04/15: EF 55-60 percent with grade 1 diastolic dysfunction.  - Last cardiac catheterization done 8/16: Distal RCA disease with an occluded PLA system.  - Check EEG given his reports of "losing time ".   Active Problems:   Diabetic neuropathy (HCC)    Chest pain with moderate risk of acute coronary syndrome / CAD native artery - Cycle troponins. Known CAD as described above. Continue aspirin.     Neurogenic bladder/Chronic indwelling Foley catheter    Chronic diastolic CHF (congestive heart failure) (HCC) - No signs of current decompensation. Continue lisinopril, Imdur, and metoprolol.     Diabetes type 2, uncontrolled ([place]) with peripheral circulatory complications as well as diabetic neuropathy  - Continue NPH insulin & add insulin resistant SSI.    History of DVT (deep vein thrombosis) - DVT prophylaxis ordered.    Hyperlipidemia LDL goal <70 - Continue Zetia.    Morbid obesity ([place])  -  heart healthy, carbohydrate modified diet.     Chronic venous dermatitis - We'll obtain wound skin care RN evaluation.    DVT prophylaxis - Lovenox ordered.  Code Status / Family Communication / Disposition Plan:   Code Status: Full. Family Communication: [name] is emergency contact.  Disposition Plan: Likely will need SNF.  SW consulted.   T.ime spent: 70 minutes  Tait Balistreri [facility] Pager [phone] Cell: [phone]   If 7PM-7AM, please contact night-coverage [email or web address] Password [email or web address] 10/08/2015, 1:29 PM

## 2015-10-08 NOTE — ED Notes (Signed)
Pt repositioned in bed (pulled up, blanket placed under R knee, and pillow placed under bilateral feet).

## 2015-10-08 NOTE — ED Notes (Signed)
Patient transported to X-ray 

## 2015-10-08 NOTE — ED Notes (Signed)
Patient being transported [facility]

## 2015-10-08 NOTE — ED Provider Notes (Signed)
CSN: [id number]     Arrival date & time 10/08/15  0413 History   First MD Initiated Contact with Patient 10/08/15 0421     Chief Complaint  Patient presents with  . Fall     (Consider location/radiation/quality/duration/timing/severity/associated sxs/prior Treatment) HPI  This is a 70 year old morbidly obese gentleman with a history of hypertension, diabetic neuropathy, obesity, neurogenic bladder with chronic indwelling Foley, DVT who presents following a fall. Patient reports a mechanical fall where he thinks he tripped over a threshold while using his walker. Unsure whether he lost consciousness. He states "I have the spells." States he fell on his left side. Reports left knee and left shoulder pain. Also reports abdominal pain. Current pain is 8 out of 10. He has had one episode of emesis. Prior to falling, he denies any abdominal pain or emesis. History of UTI but denies any recent fevers.  Of note, patient has a poor social situation. He has been admitted twice recently and during his last discharge he has had multiple social work and case management evaluations both over the phone and in the house to assist and evaluate with home health needs and possible placement. He has not followed up with his primary physician and work continues to help get him the resources he needs. Per report, patient is mostly chair bound and lives in a dirty environment that appears to be unsafe.  Past Medical History  Diagnosis Date  . Obese   . Hypertension   . Diabetic neuropathy, painful ([place])   . Chronic indwelling Foley catheter   . Bladder spasms   . Hyperlipidemia   . GERD (gastroesophageal reflux disease)   . Neurogenic bladder   . CAD (coronary artery disease)   . PONV (postoperative nausea and vomiting)   . CHF (congestive heart failure) ([place])   . DVT (deep venous thrombosis) ([facility]) 1980's    LLE  . Type II diabetes mellitus ([place])   . Diabetic diarrhea ([place])   . Pneumonia 1999  . OSA  (obstructive sleep apnea)     "they wanted me to wear a mask; I couldn't" (11/10/2014)  . Arthritis     "hands, ankles, knees" (11/10/2014)  . Chronic lower back pain   . Stroke [facility]) 10/2011; 06/2014    right hand numbness/notes 10/20/2011, pt not sure he had stroke in 2013; "right hand weaker and right face not quite right since" (11/10/2014)   Past Surgical History  Procedure Laterality Date  . Gastroplasty    . Total knee arthroplasty Right 1990's  . Left heart catheterization with coronary angiogram N/A 10/24/2011    Procedure: LEFT HEART CATHETERIZATION WITH CORONARY ANGIOGRAM;  Surgeon: Candee Furbish, MD;  Location: [facility] CATH LAB;  Service: Cardiovascular;  Laterality: N/A;  right radial artery approach  . Cholecystectomy open  1970's?  . Joint replacement    . Carpal tunnel release Bilateral ~ 2003-2004  . Cardiac catheterization N/A 05/02/2015    Procedure: Left Heart Cath and Coronary Angiography;  Surgeon: Lorretta Harp, MD;  Location: [facility] CV LAB;  Service: Cardiovascular;  Laterality: N/A;  . Cardiac catheterization N/A 05/04/2015    Procedure: Left Heart Cath and Coronary Angiography;  Surgeon: Wellington Hampshire, MD;  Location: [place] CV LAB;  Service: Cardiovascular;  Laterality: N/A;   Family History  Problem Relation Age of Onset  . Hypertension Mother   . Diabetes type I Father   . Cancer Brother      Testicular Cancer  . Cancer Brother  Leukemia   Social History  Substance Use Topics  . Smoking status: Former Smoker -- 2.00 packs/day for 20 years    Types: Cigarettes    Quit date: 09/10/1976  . Smokeless tobacco: Never Used  . 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  Respiratory: Positive for shortness of breath.   Cardiovascular: Negative for chest pain.  Gastrointestinal: Positive for nausea, vomiting and abdominal pain. Negative for diarrhea.  Musculoskeletal:        Left shoulder and left knee pain  All other systems reviewed and are negative.     Allergies  Ace inhibitors; Lipitor; Metformin and related; Cefadroxil; Cephalexin; Morphine and related; and Robaxin  Home Medications   Prior to Admission medications   Medication Sig Start Date End Date Taking? Authorizing Provider  acetaminophen (TYLENOL) 325 MG tablet Take 2 tablets (650 mg total) by mouth every 4 (four) hours as needed for headache or mild pain. 12/08/14   Erlene Quan, PA-C  aspirin EC 81 MG tablet Take 81 mg by mouth every morning.     Historical Provider, MD  Chlorhexidine Gluconate Cloth 2 % PADS Apply 6 each topically daily at 6 (six) AM. Patient not taking: Reported on 09/26/2015 09/25/15   [name], MD  diphenoxylate-atropine (LOMOTIL) 2.5-0.025 MG tablet Take 1 tablet by mouth 3 (three) times daily as needed for diarrhea or loose stools.  06/14/15   Historical Provider, MD  ezetimibe (ZETIA) 10 MG tablet Take 1 tablet (10 mg total) by mouth daily. 05/06/15   Rhonda G Barrett, PA-C  fexofenadine (ALLEGRA) 180 MG tablet Take 180 mg by mouth daily as needed for allergies or rhinitis.    Historical Provider, MD  furosemide (LASIX) 40 MG tablet Take 1 tablet (40 mg total) by mouth every Monday, Wednesday, and Friday. 09/25/15   [name], MD  insulin NPH Human (HUMULIN N) 100 UNIT/ML injection Inject 0.28 mLs (28 Units total) into the skin 2 (two) times daily at 8 am and 10 pm. Patient taking differently: Inject 20-30 Units into the skin 2 (two) times daily at 8 am and 10 pm.  09/12/15   Nishant Dhungel, MD  isosorbide mononitrate (IMDUR) 60 MG 24 hr tablet Take 1 tablet (60 mg total) by mouth daily. Patient not taking: Reported on 09/26/2015 07/06/15   Hosie Poisson, MD  lisinopril (PRINIVIL,ZESTRIL) 5 MG tablet Take 5 mg by mouth daily. 06/23/15   Historical Provider, MD  magnesium oxide (MAG-OX) 400 (241.3 Mg) MG tablet Take 1 tablet (400 mg total) by mouth daily. Patient not taking:  Reported on 09/26/2015 09/15/15   Nat Math, MD  metoprolol (LOPRESSOR) 50 MG tablet Take 50 mg by mouth 2 (two) times daily. 09/21/15   Historical Provider, MD  mupirocin cream (BACTROBAN) 2 % Apply topically 2 (two) times daily. 09/25/15   [name], MD  nitroGLYCERIN (NITROSTAT) 0.4 MG SL tablet Place 1 tablet (0.4 mg total) under the tongue every 5 (five) minutes as needed for chest pain. 12/08/14   Erlene Quan, PA-C  omeprazole (PRILOSEC) 20 MG capsule Take 20 mg by mouth daily.    Historical Provider, MD  oxybutynin (DITROPAN) 5 MG tablet Take 5-15 mg by mouth daily as needed for bladder spasms.     Historical Provider, MD  rosuvastatin (CRESTOR) 20 MG tablet Take 1 tablet (20 mg total) by mouth daily at 6 PM. Patient not taking: Reported  on 09/22/2015 07/06/15   Hosie Poisson, MD  senna-docusate (SENOKOT-S) 8.6-50 MG per tablet Take 2 tablets by mouth daily. Patient taking differently: Take 2 tablets by mouth 2 (two) times daily as needed for moderate constipation.  05/11/15   Carmin Muskrat, MD  traMADol (ULTRAM) 50 MG tablet Take 50-100 mg by mouth 2 (two) times daily as needed for moderate pain.  09/21/15   Historical Provider, MD   BP 153/80 mmHg  Pulse 78  Temp(Src) 97.9 F (36.6 C) (Oral)  Resp 20  SpO2 99% Physical Exam  Constitutional: He is oriented to person, place, and time.  Morbidly obese, ABC's intact, no acute distress  HENT:  Head: Normocephalic and atraumatic.  Neck: Normal range of motion.  No midline C-spine tenderness  Cardiovascular: Normal rate, regular rhythm and normal heart sounds.   No murmur heard. Pulmonary/Chest: Effort normal and breath sounds normal. No respiratory distress. He has no wheezes.  Abdominal: Soft. Bowel sounds are normal. There is tenderness. There is no rebound.  Diffuse anterior tenderness to palpation without rebound or guarding, no obvious bruising  Musculoskeletal: He exhibits edema.  Pain with range of motion of the left shoulder,  strength intact, no obvious deformities, normal range of motion of the left knee, no obvious bruising or deformities noted, normal range of motion of bilateral hips  Neurological: He is alert and oriented to person, place, and time.  Skin: Skin is warm and dry. Rash noted.  Erythema to the bilateral lower extremities with excoriation and scabbing Erythema and candidal infection noted underneath the patient's pannus  Psychiatric: He has a normal mood and affect.  Nursing note and vitals reviewed.   ED Course  Procedures (including critical care time) Labs Review Labs Reviewed  MRSA PCR SCREENING - Abnormal; Notable for the following:    MRSA by PCR POSITIVE (*)    All other components within normal limits  CBC WITH DIFFERENTIAL/PLATELET - Abnormal; Notable for the following:    RBC 4.13 (*)    Hemoglobin 12.6 (*)    HCT 37.7 (*)    Eosinophils Absolute 0.8 (*)    All other components within normal limits  COMPREHENSIVE METABOLIC PANEL - Abnormal; Notable for the following:    Glucose, Bld 268 (*)    Calcium 8.5 (*)    Total Protein 6.2 (*)    Albumin 2.9 (*)    AST 14 (*)    ALT 10 (*)    All other components within normal limits  CBC - Abnormal; Notable for the following:    RBC 3.94 (*)    Hemoglobin 11.8 (*)    HCT 35.2 (*)    All other components within normal limits  GLUCOSE, CAPILLARY - Abnormal; Notable for the following:    Glucose-Capillary 223 (*)    All other components within normal limits  GLUCOSE, CAPILLARY - Abnormal; Notable for the following:    Glucose-Capillary 189 (*)    All other components within normal limits  LIPASE, BLOOD  LACTIC ACID, PLASMA  TROPONIN I  CREATININE, SERUM  TROPONIN I  TROPONIN I  TROPONIN I    Imaging Review Dg Shoulder Left  10/08/2015  CLINICAL DATA:  Status post fall while going through doorway, with left shoulder pain. Initial encounter. EXAM: LEFT SHOULDER - 2+ VIEW COMPARISON:  Left shoulder radiographs performed  04/15/2015 FINDINGS: There is no evidence of fracture or dislocation. The left humeral head is seated within the glenoid fossa. Mild degenerative change is noted at the left  acromioclavicular joint. No significant soft tissue abnormalities are seen. Atelectasis is noted within the visualized portions of the lungs. IMPRESSION: No evidence of fracture or dislocation. Electronically Signed   By: Garald Balding M.D.   On: 10/08/2015 05:26   Dg Knee Complete 4 Views Left  10/08/2015  CLINICAL DATA:  Status post fall while going through doorway, with left knee pain. Initial encounter. EXAM: LEFT KNEE - COMPLETE 4+ VIEW COMPARISON:  Left knee radiographs performed 09/23/2015 FINDINGS: There is no evidence of fracture or dislocation. Lateral compartment narrowing is noted, with marginal osteophyte formation at the lateral and patellofemoral compartments. Wall osteophytes are seen. No significant joint effusion is seen. The visualized soft tissues are normal in appearance. IMPRESSION: 1. No evidence of fracture or dislocation. 2. Mild osteoarthritis, with narrowing of the lateral compartment. Electronically Signed   By: Garald Balding M.D.   On: 10/08/2015 05:27   Dg Abd Acute W/chest  10/08/2015  CLINICAL DATA:  Acute onset of generalized abdominal pain status post fall. Initial encounter. EXAM: DG ABDOMEN ACUTE W/ 1V CHEST COMPARISON:  CT of the chest performed 09/07/2015, and CT of the abdomen and pelvis from 05/12/2015 FINDINGS: The lungs are well-aerated. Vascular congestion is noted. There is no evidence of focal opacification, pleural effusion or pneumothorax. The cardiomediastinal silhouette is borderline normal in size. The visualized bowel gas pattern is unremarkable. Scattered stool and air are seen within the colon; there is no evidence of small bowel dilatation to suggest obstruction. No free intra-abdominal air is identified on the provided decubitus view. Postoperative change is noted at the upper  abdomen, with a bowel suture line at the left upper quadrant. No acute osseous abnormalities are seen. Diffuse joint space narrowing is noted at the right hip, with associated sclerosis. IMPRESSION: 1. Unremarkable bowel gas pattern; no free intra-abdominal air seen. Moderate amount of stool noted in the colon. 2. Vascular congestion.  Lungs remain grossly clear. 3. Diffuse joint space narrowing at the right hip, with associated sclerosis. Electronically Signed   By: Garald Balding M.D.   On: 10/08/2015 05:25   I have personally reviewed and evaluated these images and lab results as part of my medical decision-making.   EKG Interpretation   Date/Time:  Saturday October 08 2015 06:18:27 EST Ventricular Rate:  82 PR Interval:  293 QRS Duration: 125 QT Interval:  417 QTC Calculation: 487 R Axis:   -43 Text Interpretation:  Sinus rhythm Ventricular bigeminy Prolonged PR  interval Left bundle branch block Unsustained bigeminy Otherwise no  significant change Confirmed by Rillie Riffel  MD, Shastina Rua ([id number]) on 10/08/2015  6:22:32 AM      MDM   Final diagnoses:  None   Patient presents following a fall. He reports a mechanical fall. He had a similar presentation several weeks ago after which an observation stay as he was unable to care for himself or ambulate safely. He has since followed up with Education officer, museum and case management. He has a very poor social situation. It is unclear how many of his complaints are acute. No obvious injuries on exam. Initial imaging and lab work is reassuring. He did have one episode of chest pain while in the ER. EKG is nonischemic and troponin is negative. We will cycle troponins and one additional time. I have reviewed patient's social worker and care management notes. We'll have social worker evaluate the patient 8 AM to decide best plan of care. He is unable to ambulate at this time.  Merryl Hacker, MD 10/08/15 2256

## 2015-10-08 NOTE — ED Notes (Addendum)
Attempted to ambulate pt. With a walker and wheelchair  by 2 person assist, unsuccessful. Pt. Was complaining of leg pain upon attempting to stand up, able to sit at the side of the bed but unable to slide back to  bed. 6 people assisted pt. To Get back to bed. MD notified.

## 2015-10-08 NOTE — ED Notes (Signed)
Delay in labs pt in X-ray

## 2015-10-08 NOTE — Progress Notes (Signed)
Patient complaining of sharp intermitent right leg pain.  Pulse palpable.  Dr. Rockne Menghini notified  new orders received.

## 2015-10-08 NOTE — Progress Notes (Signed)
Brief Rx note:  Lovenox  Rx adjusted Lovenox to 70mg  (~0.5mg /kg) daily in pt with BMI=47.8  Thanks Dorrene German 10/08/2015 9:42 AM

## 2015-10-08 NOTE — ED Notes (Signed)
Per EMS pt fell going through a doorway  Pt states his walker got hung on the threshold of the door causing him to fall  Pt is c/o left knee and shoulder pain and abd pain   Pt denies neck or back pain

## 2015-10-09 ENCOUNTER — Observation Stay (HOSPITAL_BASED_OUTPATIENT_CLINIC_OR_DEPARTMENT_OTHER): Payer: Medicare Other

## 2015-10-09 DIAGNOSIS — W19XXXD Unspecified fall, subsequent encounter: Secondary | ICD-10-CM | POA: Diagnosis not present

## 2015-10-09 DIAGNOSIS — I5032 Chronic diastolic (congestive) heart failure: Secondary | ICD-10-CM | POA: Diagnosis not present

## 2015-10-09 DIAGNOSIS — R52 Pain, unspecified: Secondary | ICD-10-CM

## 2015-10-09 DIAGNOSIS — Z9289 Personal history of other medical treatment: Secondary | ICD-10-CM | POA: Diagnosis not present

## 2015-10-09 DIAGNOSIS — R079 Chest pain, unspecified: Secondary | ICD-10-CM | POA: Diagnosis not present

## 2015-10-09 LAB — BASIC METABOLIC PANEL
ANION GAP: 7 (ref 5–15)
BUN: 10 mg/dL (ref 6–20)
CALCIUM: 7.9 mg/dL — AB (ref 8.9–10.3)
CO2: 27 mmol/L (ref 22–32)
Chloride: 101 mmol/L (ref 101–111)
Creatinine, Ser: 0.59 mg/dL — ABNORMAL LOW (ref 0.61–1.24)
GFR calc Af Amer: >60 mL/min (ref >60)
GFR calc non Af Amer: >60 mL/min (ref >60)
GLUCOSE: 106 mg/dL — AB (ref 65–99)
POTASSIUM: 3.7 mmol/L (ref 3.5–5.1)
Sodium: 135 mmol/L (ref 135–145)

## 2015-10-09 LAB — URINALYSIS, ROUTINE W REFLEX MICROSCOPIC
BILIRUBIN URINE: NEGATIVE
Glucose, UA: NEGATIVE mg/dL
Ketones, ur: NEGATIVE mg/dL
Nitrite: NEGATIVE
PH: 6 (ref 5.0–8.0)
Protein, ur: 30 mg/dL — AB
SPECIFIC GRAVITY, URINE: 1.009 (ref 1.005–1.030)

## 2015-10-09 LAB — CBC
HEMATOCRIT: 33.6 % — AB (ref 39.0–52.0)
HEMOGLOBIN: 11.2 g/dL — AB (ref 13.0–17.0)
MCH: 30 pg (ref 26.0–34.0)
MCHC: 33.3 g/dL (ref 30.0–36.0)
MCV: 90.1 fL (ref 78.0–100.0)
Platelets: 184 10*3/uL (ref 150–400)
RBC: 3.73 MIL/uL — ABNORMAL LOW (ref 4.22–5.81)
RDW: 13.5 % (ref 11.5–15.5)
WBC: 7.4 10*3/uL (ref 4.0–10.5)

## 2015-10-09 LAB — GLUCOSE, CAPILLARY
Glucose-Capillary: 154 mg/dL — ABNORMAL HIGH (ref 65–99)
Glucose-Capillary: 85 mg/dL (ref 65–99)
Glucose-Capillary: 83 mg/dL (ref 65–99)
Glucose-Capillary: 92 mg/dL (ref 65–99)
Glucose-Capillary: 140 mg/dL — ABNORMAL HIGH (ref 65–99)

## 2015-10-09 LAB — URINE MICROSCOPIC-ADD ON

## 2015-10-09 MED ORDER — CIPROFLOXACIN IN D5W 400 MG/200ML IV SOLN
400.0000 mg | Freq: Two times a day (BID) | INTRAVENOUS | Status: DC
  Administered 2015-10-09 – 2015-10-10 (×3): 400 mg via INTRAVENOUS
  Filled 2015-10-09 (×3): qty 200

## 2015-10-09 MED ORDER — ENOXAPARIN SODIUM 80 MG/0.8ML ~~LOC~~ SOLN
80.0000 mg | Freq: Every day | SUBCUTANEOUS | Status: DC
  Administered 2015-10-10 – 2015-10-14 (×5): 80 mg via SUBCUTANEOUS
  Filled 2015-10-09 (×5): qty 0.8

## 2015-10-09 MED ORDER — HYDROCERIN EX CREA
TOPICAL_CREAM | Freq: Two times a day (BID) | CUTANEOUS | Status: DC
  Administered 2015-10-09 – 2015-10-11 (×5): via TOPICAL
  Administered 2015-10-11: 1 via TOPICAL
  Administered 2015-10-12 – 2015-10-14 (×5): via TOPICAL
  Filled 2015-10-09: qty 113

## 2015-10-09 NOTE — Progress Notes (Signed)
Notified attending MD of reported 2.69 sec pause w/PVC called in by CCMD

## 2015-10-09 NOTE — Progress Notes (Signed)
Pt refused placement of prevalon boots, stated that they cause his foot to turn and caused the open area to his R heel to worsen.

## 2015-10-09 NOTE — Evaluation (Signed)
Physical Therapy Evaluation Patient Details Name: [name] MRN: [id number] DOB: [birth year] Today's Date: 10/09/2015   History of Present Illness  [name] is an 70 y.o. male with a PMH of morbid obesity, hypertension, diabetic neuropathy, neurogenic bladder with chronic indwelling Foley, prior DVT who presents today after suffering from a mechanical fall at home. Although he thinks he tripped over the threshold while using his walker, he is unsure if he passed out.  Clinical Impression  Pt admitted with above diagnosis. Pt currently with functional limitations due to the deficits listed below (see PT Problem List).   Pt will benefit from skilled PT to increase their independence and safety with mobility to allow discharge to the venue listed below.       Follow Up Recommendations SNF;Supervision/Assistance - 24 hour    Equipment Recommendations  None recommended by PT    Recommendations for Other Services       Precautions / Restrictions Precautions Precautions: Fall Precaution Comments: pt appears to have brief episodes that he states he is not aware of Restrictions Weight Bearing Restrictions: No      Mobility  Bed Mobility Overal bed mobility: Needs Assistance Bed Mobility: Supine to Sit;Sit to Supine     Supine to sit: Supervision;HOB elevated Sit to supine: Min assist;Mod assist   General bed mobility comments: uses rails, requires incr time and assist to bring LEs onto bed and position once in supine; pt is able to long sit in bed briefly  Transfers Overall transfer level: Needs assistance Equipment used: Rolling walker (2 wheeled) Transfers: Sit to/from Stand (x2) Sit to Stand: Mod assist;+2 physical assistance;+2 safety/equipment;From elevated surface         General transfer comment: cues for hadn placement, assist to wt shift into standing; pt then sat quickly on bed without warning reporting (but not appearing to) that he had brief LOC, HR  70s-80s  Ambulation/Gait Ambulation/Gait assistance: Mod assist;Min assist;+2 physical assistance Ambulation Distance (Feet): 3 Feet Assistive device: Rolling walker (2 wheeled)       General Gait Details: lateral steps along EOB, pt returned to bed as IV team waiting to start IV  Stairs            Wheelchair Mobility    Modified Rankin (Stroke Patients Only)       Balance Overall balance assessment: History of Falls;Needs assistance Sitting-balance support: Feet supported;No upper extremity supported Sitting balance-Leahy Scale: Fair Sitting balance - Comments: body habitus increases BOS   Standing balance support: Bilateral upper extremity supported;During functional activity Standing balance-Leahy Scale: Poor                               Pertinent Vitals/Pain Pain Assessment: Faces Faces Pain Scale: Hurts little more Pain Intervention(s): Limited activity within patient's tolerance;Monitored during session;Repositioned    Home Living Family/patient expects to be discharged to:: Skilled nursing facility Living Arrangements: Alone   Type of Home: House Home Access: Ramped entrance     Home Layout: One level Home Equipment: Cane - single point;Wheelchair - [employer] - 2 wheels;Walker - standard;Bedside commode      Prior Function Level of Independence: Needs assistance               Hand Dominance        Extremity/Trunk Assessment   Upper Extremity Assessment: Defer to OT evaluation           Lower Extremity Assessment:  Generalized weakness;RLE deficits/detail;LLE deficits/detail RLE Deficits / Details: able to lift LEs against gravity, ROM limited d/t body habitus LLE Deficits / Details: same as above     Communication      Cognition Arousal/Alertness: Awake/alert Behavior During Therapy: WFL for tasks assessed/performed Overall Cognitive Status: Impaired/Different from baseline Area of Impairment:  Safety/judgement         Safety/Judgement: Decreased awareness of safety     General Comments: pt sits suddenly  and without warning immediately after standing (flopping onto bed with his eyes closed); pt then states he doesn't remember that "that just happened"    General Comments      Exercises        Assessment/Plan    PT Assessment Patient needs continued PT services  PT Diagnosis Difficulty walking;Generalized weakness   PT Problem List Decreased strength;Decreased activity tolerance;Decreased balance;Decreased mobility;Pain;Obesity  PT Treatment Interventions Gait training;Functional mobility training;Therapeutic activities;Patient/family education;Balance training;Therapeutic exercise   PT Goals (Current goals can be found in the Care Plan section) Acute Rehab PT Goals Patient Stated Goal: to go to rehab PT Goal Formulation: With patient Time For Goal Achievement: 10/23/15 Potential to Achieve Goals: Fair    Frequency Min 3X/week   Barriers to discharge        Co-evaluation               End of Session   Activity Tolerance: Other (comment);Treatment limited secondary to medical complications (Comment) (see eval for details) Patient left: in bed;with call bell/phone within reach;with bed alarm set      Functional Assessment Tool Used: clinical judgement Functional Limitation: Mobility: Walking and moving around Mobility: Walking and Moving Around Current Status [id number]): At least 60 percent but less than 80 percent impaired, limited or restricted Mobility: Walking and Moving Around Goal Status [phone]): At least 1 percent but less than 20 percent impaired, limited or restricted    Time: 1428-1450 PT Time Calculation (min) (ACUTE ONLY): 22 min   Charges:   PT Evaluation $PT Eval Moderate Complexity: 1 Procedure     PT G Codes:   PT G-Codes **NOT FOR INPATIENT CLASS** Functional Assessment Tool Used: clinical judgement Functional Limitation:  Mobility: Walking and moving around Mobility: Walking and Moving Around Current Status [id number]): At least 60 percent but less than 80 percent impaired, limited or restricted Mobility: Walking and Moving Around Goal Status [phone]): At least 1 percent but less than 20 percent impaired, limited or restricted    [facility] 10/09/2015, 2:54 PM

## 2015-10-09 NOTE — Progress Notes (Signed)
*  PRELIMINARY RESULTS* Vascular Ultrasound Right lower extremity venous duplex has been completed.  Preliminary findings: no obvious DVT noted in visualized veins.   Landry Mellow, RDMS, RVT  10/09/2015, 11:43 AM

## 2015-10-09 NOTE — Progress Notes (Signed)
Brief Rx note:  Lovenox  Rx adjusted Lovenox to 80mg  (~0.5mg /kg) daily in pt with BMI=48  Thanks Dolly Rias RPh 10/09/2015, 2:17 PM Pager [phone]

## 2015-10-09 NOTE — Consult Note (Signed)
WOC wound consult note Reason for Consult:Patient known to me from last admission (December 2016).  Right posterior heel pressure injury persists.  Bilateral LEs with edema and linear scratches/excoriations.  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  Patient is refusing air therapy via therapeutic mattress with low air loss feature. Wound type:Pressure (posterior heel), moisture Pressure Ulcer POA: Yes Measurement:Right posterior heel measures 1.4cm x 1.2cm x 0.2cm with pale pink wound bed, scant serous exudate. Bilateral inguinal and subpannicular areas with erythema and scattered satellite lesions consistent with fungal overgrowth. These are improved since last admission. No educate Wound bed:As described above. Drainage (amount, consistency, odor) As described above. Periwound:Intact and as described above. Dressing procedure/placement/frequency: I will provide Nursing with orders for twice daily application of Eucerin cream to the bilateral LEs and follow that with placement of a white petrolatum dressing to the posterior right heel pressure injury. Both feet are then to be placed into pressure redistribution heel boots.  Patient is to be turned side to side to relieve pressure on the sacrum, as noted above, he is refusing a therapeutic sleep surface.  He was provided with a pressure redistribution chair pad for the chair during last admission, he is going to see if someone can bring it to him while he is here this time as he does not wish to have two. [place] nursing team will not follow, but will remain available to this patient, the nursing and medical teams.  Please re-consult if needed. Thanks, Maudie Flakes, MSN, RN, [place], Arther Abbott  Pager# [phone]

## 2015-10-09 NOTE — Progress Notes (Signed)
[facility] PROGRESS NOTE  [name] [id number] DOB: Jan 21, 1946 DOA: 10/08/2015 PCP: Irven Shelling, MD  Assessment/Plan: #1 Fall with possible syncope/inability to walk/left knee pain status post fall Patient on telemetry floor. Last 2-D echo done 07/04/2015 with EF of 55-60% with grade 1 diastolic dysfunction. 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 EEG pending. Urinalysis worrisome for UTI. Will check urine cultures. Place on IV ciprofloxacin. PT/OT. Likely needs placement.  #2 probably urinary tract infection Patient with chronic Foley catheter. Change out Foley catheter. Check urine cultures. Place on IV ciprofloxacin.  #3 chest pain with moderate risk of acute coronary syndrome/coronary artery disease Patient denies any current chest pain. Cardiac enzymes negative 3. Continue aspirin. Monitor for now.  #4 chronic diastolic heart failure Stable. Patient seems euvolemic on examination. Continue lisinopril, Imdur, metoprolol, Lasix  #5 neurogenic bladder/chronic indwelling Foley catheter Change Foley catheter out.  #6 diabetes type 2 Continue NPH and sliding scale insulin.  #7 hyperlipidemia Continue zetia  #8 history of DVT Continue prophylactic Lovenox.  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 Eucerin cream. Prior abnormal   #11 prophylaxis PPI for GI prophylaxis. Lovenox for DVT prophylaxis.   Code Status: Full Family Communication: Updated patient, no family present. Disposition Plan: Needs placement.??ALF vs SNF   Consultants:  None  Procedures: Left shoulder xray 10/08/2015 Plain films of the knee 10/08/2015 Acute abdominal series 10/08/2015  Antibiotics:  IV ciprofloxacin 10/09/2015   HPI/Su isbjective: Patient states not feeling too well today. Patient states not sure how he fell.  Objective: Filed Vitals:   10/08/15 2117 10/09/15 0642  BP: 117/62 130/68   Pulse: 77 69  Temp: 98.1 F (36.7 C) 98.2 F (36.8 C)  Resp: 18 18    Intake/Output Summary (Last 24 hours) at 10/09/15 1043 Last data filed at 10/09/15 0646  Gross per 24 hour  Intake      0 ml  Output   2400 ml  Net  -2400 ml   Filed Weights   10/08/15 0939 10/09/15 0500 10/09/15 0642  Weight: 164.384 kg (362 lb 6.4 oz) 164.928 kg (363 lb 9.6 oz) 165.381 kg (364 lb 9.6 oz)    Exam:   General:  NAD  Cardiovascular: RRR  Respiratory: CTAB anterior lung fields.  Abdomen: Soft/NT/ND/+BS  Musculoskeletal: No c/c/e. Chronic venous stasis changes. Excoriations.  Data Reviewed: Basic Metabolic Panel:  Recent Labs Lab 10/08/15 0549 10/08/15 1014 10/09/15 0828  NA 137  --  135  K 3.9  --  3.7  CL 102  --  101  CO2 25  --  27  GLUCOSE 268*  --  106*  BUN 11  --  10  CREATININE 0.69 0.65 0.59*  CALCIUM 8.5*  --  7.9*   Liver Function Tests:  Recent Labs Lab 10/08/15 0549  AST 14*  ALT 10*  ALKPHOS 79  BILITOT 0.5  PROT 6.2*  ALBUMIN 2.9*    Recent Labs Lab 10/08/15 0549  LIPASE 20   No results for input(s): AMMONIA in the last 168 hours. CBC:  Recent Labs Lab 10/08/15 0549 10/08/15 1014 10/09/15 0828  WBC 10.3 9.6 7.4  NEUTROABS 7.0  --   --   HGB 12.6* 11.8* 11.2*  HCT 37.7* 35.2* 33.6*  MCV 91.3 89.3 90.1  PLT 187 183 184   Cardiac Enzymes:  Recent Labs Lab 10/08/15 0549 10/08/15 1014 10/08/15 1559 10/08/15 2221  TROPONINI <0.03 <0.03 <  0.03 <0.03   BNP (last 3 results)  Recent Labs  12/22/14 2330 07/02/15 1439 09/23/15 0521  BNP 120.1* 320.3* 130.9*    ProBNP (last 3 results) No results for input(s): PROBNP in the last 8760 hours.  CBG:  Recent Labs Lab 10/08/15 1116 10/08/15 1714 10/09/15 0006 10/09/15 0822  GLUCAP 223* 189* 154* 85    Recent Results (from the past 240 hour(s))  MRSA PCR Screening     Status: Abnormal   Collection Time: 10/08/15  3:04 PM  Result Value Ref Range Status   MRSA by PCR  POSITIVE (A) NEGATIVE Final    Comment:        The GeneXpert MRSA Assay (FDA approved for NASAL specimens only), is one component of a comprehensive MRSA colonization surveillance program. It is not intended to diagnose MRSA infection nor to guide or monitor treatment for MRSA infections. RESULT CALLED TO, READ BACK BY AND VERIFIED WITH: K.CHEEK RN AT [id number] ON 10/08/15 BY S.VANHOORNE      Studies: Dg Shoulder Left  10/08/2015  CLINICAL DATA:  Status post fall while going through doorway, with left shoulder pain. Initial encounter. EXAM: LEFT SHOULDER - 2+ VIEW COMPARISON:  Left shoulder radiographs performed 04/15/2015 FINDINGS: There is no evidence of fracture or dislocation. The left humeral head is seated within the glenoid fossa. Mild degenerative change is noted at the left acromioclavicular joint. No significant soft tissue abnormalities are seen. Atelectasis is noted within the visualized portions of the lungs. IMPRESSION: No evidence of fracture or dislocation. Electronically Signed   By: Garald Balding M.D.   On: 10/08/2015 05:26   Dg Knee Complete 4 Views Left  10/08/2015  CLINICAL DATA:  Status post fall while going through doorway, with left knee pain. Initial encounter. EXAM: LEFT KNEE - COMPLETE 4+ VIEW COMPARISON:  Left knee radiographs performed 09/23/2015 FINDINGS: There is no evidence of fracture or dislocation. Lateral compartment narrowing is noted, with marginal osteophyte formation at the lateral and patellofemoral compartments. Wall osteophytes are seen. No significant joint effusion is seen. The visualized soft tissues are normal in appearance. IMPRESSION: 1. No evidence of fracture or dislocation. 2. Mild osteoarthritis, with narrowing of the lateral compartment. Electronically Signed   By: Garald Balding M.D.   On: 10/08/2015 05:27   Dg Abd Acute W/chest  10/08/2015  CLINICAL DATA:  Acute onset of generalized abdominal pain status post fall. Initial encounter. EXAM: DG  ABDOMEN ACUTE W/ 1V CHEST COMPARISON:  CT of the chest performed 09/07/2015, and CT of the abdomen and pelvis from 05/12/2015 FINDINGS: The lungs are well-aerated. Vascular congestion is noted. There is no evidence of focal opacification, pleural effusion or pneumothorax. The cardiomediastinal silhouette is borderline normal in size. The visualized bowel gas pattern is unremarkable. Scattered stool and air are seen within the colon; there is no evidence of small bowel dilatation to suggest obstruction. No free intra-abdominal air is identified on the provided decubitus view. Postoperative change is noted at the upper abdomen, with a bowel suture line at the left upper quadrant. No acute osseous abnormalities are seen. Diffuse joint space narrowing is noted at the right hip, with associated sclerosis. IMPRESSION: 1. Unremarkable bowel gas pattern; no free intra-abdominal air seen. Moderate amount of stool noted in the colon. 2. Vascular congestion.  Lungs remain grossly clear. 3. Diffuse joint space narrowing at the right hip, with associated sclerosis. Electronically Signed   By: Garald Balding M.D.   On: 10/08/2015 05:25    Scheduled Meds: .  aspirin EC  81 mg Oral q morning - 10a  . Chlorhexidine Gluconate Cloth  6 each Topical Q0600  . ciprofloxacin  400 mg Intravenous BID  . enoxaparin (LOVENOX) injection  70 mg Subcutaneous Daily  . ezetimibe  10 mg Oral Daily  . [START ON 10/10/2015] furosemide  40 mg Oral Q M,W,F  . insulin aspart  0-20 Units Subcutaneous TID WC  . insulin aspart  0-5 Units Subcutaneous QHS  . insulin NPH Human  28 Units Subcutaneous BID AC & HS  . isosorbide mononitrate  60 mg Oral Daily  . lisinopril  5 mg Oral Daily  . loratadine  10 mg Oral Daily  . metoprolol  25 mg Oral BID  . mupirocin ointment  1 application Nasal BID  . pantoprazole  40 mg Oral Daily  . sodium chloride flush  3 mL Intravenous Q12H  . sodium chloride flush  3 mL Intravenous Q12H   Continuous  Infusions:   Principal Problem:   Fall with possible syncope Active Problems:   Diabetic neuropathy (HCC)   Chest pain with moderate risk of acute coronary syndrome   Chronic indwelling Foley catheter   Chronic diastolic CHF (congestive heart failure) ([place])   Diabetes type 2, uncontrolled ([place])   History of DVT (deep vein thrombosis)   Neurogenic bladder   Hyperlipidemia LDL goal <70   Type II diabetes mellitus with peripheral circulatory disorder, uncontrolled ([place])   Morbid obesity ([place])   CAD in native artery   Left knee pain   Inability to ambulate due to knee   Unable to walk   Chronic venous stasis dermatitis of both lower extremities    Time spent: [facility] Pager [phone]. If 7PM-7AM, please contact night-coverage at [email or web address], password [facility] 10/09/2015, 10:43 AM

## 2015-10-10 ENCOUNTER — Other Ambulatory Visit: Payer: Self-pay | Admitting: *Deleted

## 2015-10-10 ENCOUNTER — Other Ambulatory Visit: Payer: Self-pay

## 2015-10-10 ENCOUNTER — Observation Stay (HOSPITAL_COMMUNITY)
Admit: 2015-10-10 | Discharge: 2015-10-10 | Disposition: A | Discharge status: Home / Self Care | Payer: Medicare Other | Attending: Internal Medicine | Admitting: Internal Medicine

## 2015-10-10 DIAGNOSIS — E0841 Diabetes mellitus due to underlying condition with diabetic mononeuropathy: Secondary | ICD-10-CM | POA: Diagnosis not present

## 2015-10-10 DIAGNOSIS — Z86718 Personal history of other venous thrombosis and embolism: Secondary | ICD-10-CM

## 2015-10-10 DIAGNOSIS — Z9289 Personal history of other medical treatment: Secondary | ICD-10-CM

## 2015-10-10 DIAGNOSIS — R55 Syncope and collapse: Secondary | ICD-10-CM | POA: Diagnosis not present

## 2015-10-10 DIAGNOSIS — I2510 Atherosclerotic heart disease of native coronary artery without angina pectoris: Secondary | ICD-10-CM

## 2015-10-10 DIAGNOSIS — W19XXXD Unspecified fall, subsequent encounter: Secondary | ICD-10-CM

## 2015-10-10 DIAGNOSIS — I5032 Chronic diastolic (congestive) heart failure: Secondary | ICD-10-CM | POA: Diagnosis not present

## 2015-10-10 LAB — CBC
HCT: 36.5 % — ABNORMAL LOW (ref 39.0–52.0)
HEMOGLOBIN: 12.1 g/dL — AB (ref 13.0–17.0)
MCH: 30.6 pg (ref 26.0–34.0)
MCHC: 33.2 g/dL (ref 30.0–36.0)
MCV: 92.2 fL (ref 78.0–100.0)
Platelets: 201 10*3/uL (ref 150–400)
RBC: 3.96 MIL/uL — ABNORMAL LOW (ref 4.22–5.81)
RDW: 13.5 % (ref 11.5–15.5)
WBC: 7.1 10*3/uL (ref 4.0–10.5)

## 2015-10-10 LAB — BASIC METABOLIC PANEL
Anion gap: 7 (ref 5–15)
BUN: 8 mg/dL (ref 6–20)
CALCIUM: 8.2 mg/dL — AB (ref 8.9–10.3)
CHLORIDE: 100 mmol/L — AB (ref 101–111)
CO2: 31 mmol/L (ref 22–32)
CREATININE: 0.61 mg/dL (ref 0.61–1.24)
GFR calc non Af Amer: >60 mL/min (ref >60)
Glucose, Bld: 86 mg/dL (ref 65–99)
Potassium: 3.4 mmol/L — ABNORMAL LOW (ref 3.5–5.1)
SODIUM: 138 mmol/L (ref 135–145)

## 2015-10-10 LAB — GLUCOSE, CAPILLARY
GLUCOSE-CAPILLARY: 75 mg/dL (ref 65–99)
Glucose-Capillary: 96 mg/dL (ref 65–99)
Glucose-Capillary: 170 mg/dL — ABNORMAL HIGH (ref 65–99)
Glucose-Capillary: 124 mg/dL — ABNORMAL HIGH (ref 65–99)
Glucose-Capillary: 188 mg/dL — ABNORMAL HIGH (ref 65–99)

## 2015-10-10 LAB — MAGNESIUM: MAGNESIUM: 1.5 mg/dL — AB (ref 1.7–2.4)

## 2015-10-10 MED ORDER — TRAMADOL HCL 50 MG PO TABS
100.0000 mg | ORAL_TABLET | Freq: Once | ORAL | Status: AC
  Administered 2015-10-10: 100 mg via ORAL
  Filled 2015-10-10: qty 2

## 2015-10-10 MED ORDER — POTASSIUM CHLORIDE CRYS ER 20 MEQ PO TBCR: 40.0000 meq | EXTENDED_RELEASE_TABLET | Freq: Once | ORAL | Status: DC

## 2015-10-10 MED ORDER — LORAZEPAM 0.5 MG PO TABS
0.5000 mg | ORAL_TABLET | Freq: Three times a day (TID) | ORAL | Status: DC | PRN
  Administered 2015-10-10: 0.5 mg via ORAL
  Filled 2015-10-10: qty 1

## 2015-10-10 MED ORDER — POTASSIUM CHLORIDE CRYS ER 20 MEQ PO TBCR
40.0000 meq | EXTENDED_RELEASE_TABLET | Freq: Every day | ORAL | Status: DC
Start: 2015-10-10 — End: 2015-10-14
  Administered 2015-10-10 – 2015-10-14 (×5): 40 meq via ORAL
  Filled 2015-10-10 (×5): qty 2

## 2015-10-10 MED ORDER — MAGNESIUM SULFATE 4 GM/100ML IV SOLN
4.0000 g | Freq: Once | INTRAVENOUS | Status: AC
  Administered 2015-10-10: 4 g via INTRAVENOUS
  Filled 2015-10-10: qty 100

## 2015-10-10 MED ORDER — CIPROFLOXACIN HCL 500 MG PO TABS
500.0000 mg | ORAL_TABLET | Freq: Two times a day (BID) | ORAL | Status: DC
  Administered 2015-10-10 – 2015-10-11 (×3): 500 mg via ORAL
  Filled 2015-10-10 (×3): qty 1

## 2015-10-10 NOTE — Patient Outreach (Signed)
[facility]) Care Management  10/10/2015  Jann Gales Zuver 11-09-1945 [id number]   [name] received an In [employer] from [place] with [place] Management indicating that patient had readmitted into the hospital over the weekend due to a fall.  [name] had already made arrangements for patient to go and view the only two assisted living facilities that made bed offers, via [place], at the expense of [facility].  [name] will cancel these arrangements.  [name] will also notify [name], Adult Materials engineer with the [facility].  Last, [name] will contact [place] with [facility] Care [facility] to ensure that they are aware of patient's hospital admission, as they are currently providing home care services to patient.  Once [name] is notified of patient's inpatient hospital social worker, [name] will contact them to assist with arranging long-term care placement for patient.  It is imperative that patient not return home to live alone, patient is not capable of caring for himself adequately, nor is there anyone available to provide 24 hour care and supervision.  [name], BSW, MSW, LCSW  Licensed Education officer, [facility]  Mailing [place] [address], [place], [place] [address] Physical Address-[address], [place], [place] [address] Toll Free Main # [phone] Fax # [phone] Cell # [phone]  Fax # [phone]  [name] complies with [employer] civil rights laws and does not discriminate on the basis of race, color, national origin, age, disability, or sex.  Espaol (Spanish)  [place] cumple con las leyes federales de derechos civiles aplicables y no discrimina por motivos de raza, color, nacionalidad, edad, discapacidad o  sexo.     Ti?[place] Vi?t ([place])  [place] tun th? lu?t dn quy?n hi?n hnh c?a Lin bang v khng phn bi?t ?i x? d?a trn ch?ng t?c, mu da, ngu?n g?c qu?c gia, ? tu?i, khuy?t t?t, ho?c gi?i tnh.     (Arabic)

## 2015-10-10 NOTE — Patient Outreach (Signed)
S:  Home visit made on Thursday, 10/06/15, to complete FL2 for needed ALF placement.  Pt living alone, stays mostly in his recliner all day and night. He has back and knee pain which makes it nearly impossible to walk with his walker.  O:  BP 100/60 mmHg  Pulse 64  Resp 18  SpO2 98%, FBS 242       Awake, alert, and cooperative with the exam.       Pt was asked to stand for skin assessment. He took him several attempts to stand, he was very unsteady with the walker. Upon sitting he appeared to have a syncopal  episode but aroused with the home health nurse, checking him, calling his name.(Pt had not eaten breakfast yet nor taken his insulin, although he had taken his am po meds). We made pt oatmeal and gave his insulin. The home health nurse checked his BP again approximately 5 minutes after this episode and his BP was actually elevated from previous check to 150/80.         Skin: very poor hygiene, skin is excessively dry. He has multiple scratch marks which are varying in severity on his arms, legs, back, chest. He has yeast rash in his abdominal folds. He has 2 open lesions: #1 on R buttock, 1 cm X 1 cm, which appears like it may be from fingernail puncture vs. Pressure wound. #2 is a pressure area/venous wound on his R heal which is 2cm X1.5 cm and Stage III. His lower extremities have evidence of previous severe edema with splitting of the skin vertically above his ankles, dried blood is evident bilaterally. His feet are pink and he currently has 3+ edema.  RRR Lungs clear Abdomen soft, bowel sounds active throughout.  A:    Needs immediate placement-unable to care for himself.        Syncopal episode witnessed  P:  FL2 completed and delivered back to [facility] office for [name] to fax to facilities.      Advised pt to reduce his metoprolol from 50 mg bid to 25 mg bid and made this change           to his FL2 form.       Provided wound care to R heel: washed with wound cleanser and applied a silver            anitimicrobial wound dsg 4 X 4 over the area.      Home health was present with pt upon my departure providing personal care.  [name] [facility] [phone]

## 2015-10-10 NOTE — Consult Note (Signed)
   [facility] CM Inpatient Consult   10/10/2015  [name] 1946/02/12 [id number]   Notified of patient's hospital admission. Made inpatient RNCM and inpatient Licensed [name] aware that [facility] has been following patient closely. Please see detailed notes from [facility] team under chart review tab then notes. The plan had been to get patient placed into an ALF from home. However, he was readmitted prior to this taking place. APS has been involved. Inpatient Licensed [name] has also been in contact with the [facility] case worker while patient is here in the hospital. Attempts are being made for patient to be placed from hospital to ALF. Hopefully this will happen as patient's home environment is unsafe. Showed patient pictures of two Assisted Living Facilities that have been mentioned [facility] and [facility] [name] reports he would be interested in [facility]. Discussed how patient has run out of SNF days so ALF is his safest option for him. Called and spoke with [facility] Licensed [name] while at bedside for her to speak with [name] as well. Will continue to follow and collaborate with all involved. Appreciative of inpatient staff collaboration.  [name], MSN-Ed, RN,BSN [facility] Liaison [phone]

## 2015-10-10 NOTE — Consult Note (Signed)
CARDIOLOGY CONSULT NOTE      Patient ID: [name] MRN: [id number] DOB/AGE: Jul 22, 1946 70 y.o.  Admit date: 10/08/2015 Referring PhysicianDaniel Durene Cal, MD Primary PhysicianGRIFFIN,Brian [name], MD Primary Cardiologist [facility] Reason for Consultation possible syncope  HPI: 70 year old man with multiple medical problems including diabetes, hypertension, diabetic neuropathy and neurogenic bladder. He came to the hospital couple days ago after suffering a fall while at home. He was ambulating with his walker. He heard a noise behind him. He turned and the next thing he remembers is that he was on the floor.  He does not know how long he was down. He does feel that there was some time that he does not recall, where he was unconscious.   Over the past several months, he has had other spells where he "passes out." He does lose his time in his words. These episodes typically occur when he is sitting and he tries to stand up. Due to joint issues and morbid obesity, he spends most of his time in a chair. Due to his multiple hospitalizations and multiple medical issues, nursing home placement has been recommended, but he has been resistant.  Of note, he has had a recent cardiac workup. Echocardiogram in late 2016 revealed normal left jugular function. He underwent cardiac catheterization at that time. Cardiac cath was complicated by a RCA dissection which spontaneously healed. No interventions were performed.  Review of systems complete and found to be negative unless listed above   Past Medical History  Diagnosis Date  . Obese   . Hypertension   . Diabetic neuropathy, painful ([place])   . Chronic indwelling Foley catheter   . Bladder spasms   . Hyperlipidemia   . GERD (gastroesophageal reflux disease)   . Neurogenic bladder   . CAD (coronary artery disease)   . PONV (postoperative nausea and vomiting)   . CHF (congestive heart failure) ([place])   . DVT (deep venous thrombosis) (HCC) 1980's     LLE  . Type II diabetes mellitus ([place])   . Diabetic diarrhea ([place])   . Pneumonia 1999  . OSA (obstructive sleep apnea)     "they wanted me to wear a mask; I couldn't" (11/10/2014)  . Arthritis     "hands, ankles, knees" (11/10/2014)  . Chronic lower back pain   . Stroke [facility]) 10/2011; 06/2014    right hand numbness/notes 10/20/2011, pt not sure he had stroke in 2013; "right hand weaker and right face not quite right since" (11/10/2014)    Family History  Problem Relation Age of Onset  . Hypertension Mother   . Diabetes type I Father   . Cancer Brother      Testicular Cancer  . Cancer Brother     Leukemia    Social History   Social History  . Marital Status: Widowed    Spouse Name: N/A  . Number of Children: N/A  . Years of Education: N/A   Occupational History  . Retired    Social History Main Topics  . Smoking status: Former Smoker -- 2.00 packs/day for 20 years    Types: Cigarettes    Quit date: 09/10/1976  . Smokeless tobacco: Never Used  . 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 Drug Use: No  . Sexual Activity: No   Other Topics Concern  . Not on file   Social History Narrative   Lives alone.  No close family nearby.  Past Surgical History  Procedure Laterality Date  . Gastroplasty    . Total knee arthroplasty Right 1990's  . Left heart catheterization with coronary angiogram N/A 10/24/2011    Procedure: LEFT HEART CATHETERIZATION WITH CORONARY ANGIOGRAM;  Surgeon: Brian Furbish, MD;  Location: [facility] CATH LAB;  Service: Cardiovascular;  Laterality: N/A;  right radial artery approach  . Cholecystectomy open  1970's?  . Joint replacement    . Carpal tunnel release Bilateral ~ 2003-2004  . Cardiac catheterization N/A 05/02/2015    Procedure: Left Heart Cath and Coronary Angiography;  Surgeon: Brian Harp, MD;  Location: [place] CV LAB;  Service: Cardiovascular;  Laterality: N/A;  . Cardiac catheterization N/A 05/04/2015     Procedure: Left Heart Cath and Coronary Angiography;  Surgeon: Brian Hampshire, MD;  Location: [place] CV LAB;  Service: Cardiovascular;  Laterality: N/A;     Prescriptions prior to admission  Medication Sig Dispense Refill Last Dose  . acetaminophen (TYLENOL) 325 MG tablet Take 2 tablets (650 mg total) by mouth every 4 (four) hours as needed for headache or mild pain.   Past Month at Unknown time  . aspirin EC 81 MG tablet Take 81 mg by mouth every morning.    10/07/2015 at Unknown time  . diphenoxylate-atropine (LOMOTIL) 2.5-0.025 MG tablet Take 1 tablet by mouth 3 (three) times daily as needed for diarrhea or loose stools.   2 Past Week at Unknown time  . ezetimibe (ZETIA) 10 MG tablet Take 1 tablet (10 mg total) by mouth daily. 30 tablet 11 10/07/2015 at Unknown time  . fexofenadine (ALLEGRA) 180 MG tablet Take 180 mg by mouth daily as needed for allergies or rhinitis.   Past Month at Unknown time  . furosemide (LASIX) 40 MG tablet Take 1 tablet (40 mg total) by mouth every Monday, Wednesday, and Friday. 30 tablet 11 10/07/2015 at Unknown time  . insulin NPH Human (HUMULIN N) 100 UNIT/ML injection Inject 0.28 mLs (28 Units total) into the skin 2 (two) times daily at 8 am and 10 pm. (Patient taking differently: Inject 20-30 Units into the skin 2 (two) times daily at 8 am and 10 pm. ) 10 mL 11 10/06/2015 at Unknown time  . isosorbide mononitrate (IMDUR) 60 MG 24 hr tablet Take 1 tablet (60 mg total) by mouth daily.   10/07/2015 at Unknown time  . lisinopril (PRINIVIL,ZESTRIL) 5 MG tablet Take 5 mg by mouth daily.  3 10/07/2015 at Unknown time  . metoprolol (LOPRESSOR) 50 MG tablet Take 25 mg by mouth 2 (two) times daily.    10/07/2015 at am  . nitroGLYCERIN (NITROSTAT) 0.4 MG SL tablet Place 1 tablet (0.4 mg total) under the tongue every 5 (five) minutes as needed for chest pain. 25 tablet 2 Past Month at Unknown time  . omeprazole (PRILOSEC) 20 MG capsule Take 20 mg by mouth daily.   10/07/2015 at  Unknown time  . oxybutynin (DITROPAN) 5 MG tablet Take 5-15 mg by mouth daily as needed for bladder spasms.    Past Week at Unknown time  . senna-docusate (SENOKOT-S) 8.6-50 MG per tablet Take 2 tablets by mouth daily. (Patient taking differently: Take 2 tablets by mouth 2 (two) times daily as needed for moderate constipation. ) 28 tablet 0 Past Week at Unknown time  . traMADol (ULTRAM) 50 MG tablet Take 50-100 mg by mouth 2 (two) times daily as needed for moderate pain.    10/07/2015 at 1400  . Chlorhexidine Gluconate Cloth 2 %  PADS Apply 6 each topically daily at 6 (six) AM. (Patient not taking: Reported on 09/26/2015) 5 each 0 Not Taking at Unknown time  . magnesium oxide (MAG-OX) 400 (241.3 Mg) MG tablet Take 1 tablet (400 mg total) by mouth daily. (Patient not taking: Reported on 09/26/2015) 10 tablet 0 Not Taking at Unknown time  . mupirocin cream (BACTROBAN) 2 % Apply topically 2 (two) times daily. (Patient not taking: Reported on 10/08/2015) 15 g 0 Not Taking at Unknown time  . rosuvastatin (CRESTOR) 20 MG tablet Take 1 tablet (20 mg total) by mouth daily at 6 PM. (Patient not taking: Reported on 09/22/2015)   Not Taking at Unknown time    Physical Exam: Vitals:   Filed Vitals:   10/09/15 1458 10/09/15 2120 10/10/15 0420 10/10/15 1305  BP: 136/66 105/79 138/59 136/55  Pulse: 65 70 70 67  Temp: 98 F (36.7 C) 97.8 F (36.6 C) 97.8 F (36.6 C) 98.1 F (36.7 C)  TempSrc: Oral Oral Oral Oral  Resp: 16 18 18 18   Height:      Weight:   359 lb 11.2 oz (163.159 kg)   SpO2: 99% 97% 97% 94%   I&O's:   Intake/Output Summary (Last 24 hours) at 10/10/15 1504 Last data filed at 10/10/15 1300  Gross per 24 hour  Intake   1160 ml  Output   4800 ml  Net  -3640 ml   Physical exam:  [place]/AT EOMI No JVD, No carotid bruit RRR S1S2  No wheezing Soft. NT, nondistended, obese Bilateral LE edema. No focal motor or sensory deficits Normal affect  Labs:   Lab Results  Component Value Date    WBC 7.1 10/10/2015   HGB 12.1* 10/10/2015   HCT 36.5* 10/10/2015   MCV 92.2 10/10/2015   PLT 201 10/10/2015    Recent Labs Lab 10/08/15 0549  10/10/15 0530  NA 137  < > 138  K 3.9  < > 3.4*  CL 102  < > 100*  CO2 25  < > 31  BUN 11  < > 8  CREATININE 0.69  < > 0.61  CALCIUM 8.5*  < > 8.2*  PROT 6.2*  --   --   BILITOT 0.5  --   --   ALKPHOS 79  --   --   ALT 10*  --   --   AST 14*  --   --   GLUCOSE 268*  < > 86  < > = values in this interval not displayed. Lab Results  Component Value Date   CKTOTAL 63 09/22/2015   CKMB 2.8 11/18/2011   TROPONINI <0.03 10/08/2015    Lab Results  Component Value Date   CHOL 163 04/30/2015   CHOL 176 11/15/2014   CHOL 178 10/24/2012   Lab Results  Component Value Date   HDL 30* 04/30/2015   HDL 33* 11/15/2014   HDL 35* 10/24/2012   Lab Results  Component Value Date   LDLCALC 106* 04/30/2015   LDLCALC 121* 11/15/2014   LDLCALC 116* 10/24/2012   Lab Results  Component Value Date   TRIG 133 04/30/2015   TRIG 112 11/15/2014   TRIG 133 10/24/2012   Lab Results  Component Value Date   CHOLHDL 5.4 04/30/2015   CHOLHDL 5.3 11/15/2014   CHOLHDL 5.1 10/24/2012   No results found for: LDLDIRECT     EKG: NSR, prolonged PR interval  Tele: blocked PACs causing some brief pauses  ASSESSMENT AND PLAN:  Principal Problem:  Fall with possible syncope Active Problems:   Diabetic neuropathy (HCC)   NSVT (nonsustained ventricular tachycardia) (HCC)   Chest pain with moderate risk of acute coronary syndrome   Chronic indwelling Foley catheter   Chronic diastolic CHF (congestive heart failure) ([place])   Diabetes type 2, uncontrolled ([place])   History of DVT (deep vein thrombosis)   Neurogenic bladder   Hyperlipidemia LDL goal <70   Type II diabetes mellitus with peripheral circulatory disorder, uncontrolled (HCC)   Morbid obesity (HCC)   Sinus pause   CAD in native artery   Left knee pain   Inability to ambulate due to knee    Unable to walk   Chronic venous stasis dermatitis of both lower extremities  Syncope: No obvious cardiac cause identified.  With normal LV function, VT less likely.  Tele reviewed.    Brief pauses are likely from blocked PACs.   No further management of CAD.  Ruled out for MI.    DM: ? Whether hypoglycemia could explain the syncopal spell.    Knee pain and weakness in the leg, likely contributing to the falls.  Will follow.  Signed:   Mina Marble, MD, [facility] 10/10/2015, 3:04 PM

## 2015-10-10 NOTE — Progress Notes (Signed)
[name] received consult for placement - received call from [place] with [place] that APS is now involved and to call [place] (ph#: [phone]). [name] received call back from [place] that [place] worker, Ray Church (w: [phone] c#: [phone]) is involved and has been working on getting him placed in ALF from home. [name] spoke with Mechele Claude who asked that [name] follow up with [place] spoke with Joan Mayans at [facility] who states that they will not be able to come out to assess until possibly tomorrow (Tues, 1/31). [name] left message for Verdis Frederickson at [facility] - awaiting call back. [name] sent information out to [place] per [facility] request.   [name] reviewed PT evaluation & recommendation of SNF - patient has exhaused his 100 days at [facility] and has not had a 60 day wellness period for the Medicare days to renew. Patient is aware of this and per APS worker, Mechele Claude - patient is now agreeable to complete Medicaid application & return to SNF if needed. [name] sent information out to [facility] (per patient's request). Patient is agreeable to [employer] or [employer] as backup. [place] would not be able to accept patient as "Medicaid pending", awaiting responses from [employer].   [name] will follow-up with Joan Mayans at [place] in the morning re: coming out to assess & will work on [employer] as backup.    [name], [facility] Clinical Social Worker cell #: [phone]

## 2015-10-10 NOTE — Progress Notes (Signed)
Patient had 2.2 seconds pause on the monitor, and had 7 beats of v.tach earlier. Patient was asymptomatic. L. [place] PA was made aware.

## 2015-10-10 NOTE — Progress Notes (Signed)
EEG Completed; Results Pending  

## 2015-10-10 NOTE — Patient Outreach (Signed)
  Care Coordination:  Reviewed Epic and noted that patient is admitted with a fall. Informed [facility] team members and hospital liaison.   Plan:  Will continue to follow for discharge plan.  [name], RN, BSN, CEN [facility] [employer] [phone]

## 2015-10-10 NOTE — Clinical Social Work Placement (Signed)
   CLINICAL SOCIAL WORK PLACEMENT  NOTE  Date:  10/10/2015  Patient Details  Name: [name] MRN: [id number] Date of Birth: 07-12-1946  Clinical Social Work is seeking post-discharge placement for this patient at the [place] level of care (*[name] will initial, date and re-position this form in  chart as items are completed):  Yes   Patient/family provided with [place] Work Department's list of facilities offering this level of care within the geographic area requested by the patient (or if unable, by the patient's family).  Yes   Patient/family informed of their freedom to choose among providers that offer the needed level of care, that participate in Medicare, Medicaid or managed care program needed by the patient, have an available bed and are willing to accept the patient.  Yes   Patient/family informed of [place]'s ownership interest in [facility] and [facility], as well as of the fact that they are under no obligation to receive care at these facilities.  PASRR submitted to EDS on 10/10/15     PASRR number received on 10/10/15     Existing PASRR number confirmed on       FL2 transmitted to all facilities in geographic area requested by pt/family on 10/10/15     FL2 transmitted to all facilities within larger geographic area on       Patient informed that his/her managed care company has contracts with or will negotiate with certain facilities, including the following:            Patient/family informed of bed offers received.  Patient chooses bed at       Physician recommends and patient chooses bed at      Patient to be transferred to   on  .  Patient to be transferred to facility by       Patient family notified on   of transfer.  Name of family member notified:        PHYSICIAN       Additional Comment:    _______________________________________________ Standley Brooking, LCSW 10/10/2015, 4:18 PM

## 2015-10-10 NOTE — Progress Notes (Signed)
PHARMACIST - PHYSICIAN COMMUNICATION DR:   TRH CONCERNING: Antibiotic IV to Oral Route Change Policy  RECOMMENDATION: This patient is receiving Cipro by the intravenous route.  Based on criteria approved by the Pharmacy and Therapeutics Committee, the antibiotic(s) is/are being converted to the equivalent oral dose form(s).   DESCRIPTION: These criteria include:  Patient being treated for a respiratory tract infection, urinary tract infection, cellulitis or clostridium difficile associated diarrhea if on metronidazole  The patient is not neutropenic and does not exhibit a GI malabsorption state  The patient is eating (either orally or via tube) and/or has been taking other orally administered medications for a least 24 hours  The patient is improving clinically and has a Tmax < 100.5  If you have questions about this conversion, please contact the Pharmacy Department  []   [phone] )  [facility] []   [phone] )  [facility] []   [phone] )  [facility] []   [phone] )  [facility] [x]   [phone] )  [facility]

## 2015-10-10 NOTE — Clinical Social Work Placement (Signed)
[name] received consult for placement - received call from [place] with [place] that APS is now involved and to call [place] (ph#: [phone]). [name] received call back from [place] that [place] worker, Ray Church (w: [phone] c#: [phone]) is involved and has been working on getting him placed in ALF from home. [name] spoke with Mechele Claude who asked that [name] follow up with [place] spoke with Joan Mayans at [facility] who states that they will not be able to come out to assess until possibly tomorrow (Tues, 1/31). [name] left message for Verdis Frederickson at [facility] - awaiting call back. [name] sent information out to [place] per [facility] request.   [name] reviewed PT evaluation & recommendation of SNF - patient has exhaused his 100 days at [facility] and has not had a 60 day wellness period for the Medicare days to renew. Patient is aware of this and per APS worker, Mechele Claude - patient is now agreeable to complete Medicaid application & return to SNF if needed. [name] sent information out to [facility] (per patient's request). Patient is agreeable to [employer] or [employer] as backup. [place] would not be able to accept patient as "Medicaid pending", awaiting responses from [employer].   [name] will follow-up with Joan Mayans at [place] in the morning re: coming out to assess & will work on [employer] as backup.    [name], LCSW [facility] Clinical Social Worker [name] received consult for placement - received call from [place] with [place] that APS is now involved and to call [place] (ph#: [phone]). [name] received call back from [place] that [place] worker, Ray Church (w: [phone] c#: [phone]) is involved and has  been working on getting him placed in ALF from home. [name] spoke with Mechele Claude who asked that [name] follow up with [place] spoke with Joan Mayans at [place] who states that they will not be able to come out to assess until possibly tomorrow (Tues, 1/31). [name] left message for Verdis Frederickson at [facility] - awaiting call back. [name] sent information out to [place] per [facility] request.   [name] reviewed PT evaluation & recommendation of SNF - patient has exhaused his 100 days at [facility] and has not had a 60 day wellness period for the Medicare days to renew. Patient is aware of this and per APS worker, Mechele Claude - patient is now agreeable to complete Medicaid application & return to SNF if needed. [name] sent information out to [facility] (per patient's request). Patient is agreeable to [employer] or [employer] as backup. [place] would not be able to accept patient as "Medicaid pending", awaiting responses from [employer].   [name] will follow-up with Joan Mayans at [place] in the morning re: coming out to assess & will work on [employer] as backup.    [name], LCSW [facility] Clinical Social Worker cell #: [phone]    CLINICAL SOCIAL WORK PLACEMENT  NOTE  Date:  10/10/2015  Patient Details  Name: [name] MRN: [id number] Date of Birth: 1946/05/08  Clinical Social Work is seeking post-discharge placement for this patient at the [place] level of care (*[name]  will initial, date and re-position this form in  chart as items are completed):  Yes   Patient/family provided with [place] Work Department's list of facilities offering this level of care within the geographic area requested by the patient (or if unable, by the patient's family).  Yes   Patient/family  informed of their freedom to choose among providers that offer the needed level of care, that participate in Medicare, Medicaid or managed care program needed by the patient, have an available bed and are willing to accept the patient.  Yes   Patient/family informed of [place]'s ownership interest in [facility] and [facility], as well as of the fact that they are under no obligation to receive care at these facilities.  PASRR submitted to EDS on 10/10/15     PASRR number received on 10/10/15     Existing PASRR number confirmed on       FL2 transmitted to all facilities in geographic area requested by pt/family on 10/10/15     FL2 transmitted to all facilities within larger geographic area on       Patient informed that his/her managed care company has contracts with or will negotiate with certain facilities, including the following:            Patient/family informed of bed offers received.  Patient chooses bed at       Physician recommends and patient chooses bed at      Patient to be transferred to   on  .  Patient to be transferred to facility by       Patient family notified on   of transfer.  Name of family member notified:        PHYSICIAN       Additional Comment:    _______________________________________________ Standley Brooking, LCSW 10/10/2015, 4:18 PM

## 2015-10-10 NOTE — Progress Notes (Signed)
[facility] PROGRESS NOTE  Hartley Hisle Rigaud [id number] DOB: 1946/05/10 DOA: 10/08/2015 PCP: Irven Shelling, MD  Assessment/Plan: #1 Fall with possible syncope/inability to walk/left knee pain status post fall Patient on telemetry floor. Last 2-D echo done 07/04/2015 with EF of 55-60% with grade 1 diastolic dysfunction. 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 EEG pending. Urinalysis worrisome for UTI.  Urine cultures pending. Continue IV ciprofloxacin. PT/OT. Likely needs placement.  #2 probably urinary tract infection Patient with chronic Foley catheter. Changed out Foley catheter.  Urine cultures pending. Continue IV ciprofloxacin.  #3 chest pain with moderate risk of acute coronary syndrome/coronary artery disease Patient denies any current chest pain. Cardiac enzymes negative 3. Continue aspirin. Monitor for now.  #4 chronic diastolic heart failure Stable. Patient seems euvolemic on examination. Continue lisinopril, Imdur, metoprolol, Lasix  #5 SINUS PAUSE/7 BEATS VTACH    Questionable etiology. Patient on a beta blocker ever had a 7 beat run of V. Tach.  Similar situation in prior hospitalization. Magnesium level of 1.5. Potassium of 3.4.  Will replete magnesium and potassium to keep magnesium greater than 2 and potassium greater than 4. Consult with cardiology for further evaluation and management.   #5 neurogenic bladder/chronic indwelling Foley catheter Change Foley catheter out.  #6 diabetes type 2 Continue NPH and sliding scale insulin.  #7 hyperlipidemia Continue zetia  #8 history of DVT Continue prophylactic Lovenox.  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 Eucerin cream.   #11 prophylaxis PPI for GI prophylaxis. Lovenox for DVT prophylaxis.   Code Status: Full Family Communication: Updated patient, no family present. Disposition Plan: Needs placement.??ALF vs  SNF   Consultants:   cardiology pending  Procedures: Left shoulder xray 10/08/2015 Plain films of the knee 10/08/2015 Acute abdominal series 10/08/2015  EEG pending  Antibiotics:  IV ciprofloxacin 10/09/2015   HPI/Su isbjective: Patient states not feeling too well today. Patient  Denies chest pain. Patient denies shortness of breath. Patien noted to have 2 pauses over 2 seconds overnight and a 7 beat run of V. Tach.  Objective: Filed Vitals:   10/09/15 2120 10/10/15 0420  BP: 105/79 138/59  Pulse: 70 70  Temp: 97.8 F (36.6 C) 97.8 F (36.6 C)  Resp: 18 18    Intake/Output Summary (Last 24 hours) at 10/10/15 1202 Last data filed at 10/10/15 0420  Gross per 24 hour  Intake   1160 ml  Output   4800 ml  Net  -3640 ml   Filed Weights   10/09/15 0500 10/09/15 0642 10/10/15 0420  Weight: 164.928 kg (363 lb 9.6 oz) 165.381 kg (364 lb 9.6 oz) 163.159 kg (359 lb 11.2 oz)    Exam:   General:  NAD  Cardiovascular: RRR  Respiratory: CTAB anterior lung fields.  Abdomen: Soft/NT/ND/+BS  Musculoskeletal: No c/c/e. Chronic venous stasis changes. Excoriations.  Data Reviewed: Basic Metabolic Panel:  Recent Labs Lab 10/08/15 0549 10/08/15 1014 10/09/15 0828 10/10/15 0530  NA 137  --  135 138  K 3.9  --  3.7 3.4*  CL 102  --  101 100*  CO2 25  --  27 31  GLUCOSE 268*  --  106* 86  BUN 11  --  10 8  CREATININE 0.69 0.65 0.59* 0.61  CALCIUM 8.5*  --  7.9* 8.2*  MG  --   --   --  1.5*   Liver Function Tests:  Recent Labs Lab 10/08/15 0549  AST 14*  ALT 10*  ALKPHOS 79  BILITOT 0.5  PROT 6.2*  ALBUMIN 2.9*    Recent Labs Lab 10/08/15 0549  LIPASE 20   No results for input(s): AMMONIA in the last 168 hours. CBC:  Recent Labs Lab 10/08/15 0549 10/08/15 1014 10/09/15 0828 10/10/15 0530  WBC 10.3 9.6 7.4 7.1  NEUTROABS 7.0  --   --   --   HGB 12.6* 11.8* 11.2* 12.1*  HCT 37.7* 35.2* 33.6* 36.5*  MCV 91.3 89.3 90.1 92.2  PLT 187 183 184  201   Cardiac Enzymes:  Recent Labs Lab 10/08/15 0549 10/08/15 1014 10/08/15 1559 10/08/15 2221  TROPONINI <0.03 <0.03 <0.03 <0.03   BNP (last 3 results)  Recent Labs  12/22/14 2330 07/02/15 1439 09/23/15 0521  BNP 120.1* 320.3* 130.9*    ProBNP (last 3 results) No results for input(s): PROBNP in the last 8760 hours.  CBG:  Recent Labs Lab 10/09/15 1146 10/09/15 1653 10/09/15 2048 10/10/15 0523 10/10/15 0737  GLUCAP 83 92 140* 75 96    Recent Results (from the past 240 hour(s))  MRSA PCR Screening     Status: Abnormal   Collection Time: 10/08/15  3:04 PM  Result Value Ref Range Status   MRSA by PCR POSITIVE (A) NEGATIVE Final    Comment:        The GeneXpert MRSA Assay (FDA approved for NASAL specimens only), is one component of a comprehensive MRSA colonization surveillance program. It is not intended to diagnose MRSA infection nor to guide or monitor treatment for MRSA infections. RESULT CALLED TO, READ BACK BY AND VERIFIED WITH: K.CHEEK RN AT [id number] ON 10/08/15 BY S.VANHOORNE      Studies: No results found.  Scheduled Meds: . aspirin EC  81 mg Oral q morning - 10a  . Chlorhexidine Gluconate Cloth  6 each Topical Q0600  . ciprofloxacin  500 mg Oral BID  . enoxaparin (LOVENOX) injection  80 mg Subcutaneous Daily  . ezetimibe  10 mg Oral Daily  . furosemide  40 mg Oral Q M,W,F  . hydrocerin   Topical BID  . insulin aspart  0-20 Units Subcutaneous TID WC  . insulin aspart  0-5 Units Subcutaneous QHS  . insulin NPH Human  28 Units Subcutaneous BID AC & HS  . isosorbide mononitrate  60 mg Oral Daily  . lisinopril  5 mg Oral Daily  . loratadine  10 mg Oral Daily  . magnesium sulfate 1 - 4 g bolus IVPB  4 g Intravenous Once  . metoprolol  25 mg Oral BID  . mupirocin ointment  1 application Nasal BID  . pantoprazole  40 mg Oral Daily  . potassium chloride  40 mEq Oral Daily  . sodium chloride flush  3 mL Intravenous Q12H  . sodium chloride flush   3 mL Intravenous Q12H   Continuous Infusions:   Principal Problem:   Fall with possible syncope Active Problems:   Diabetic neuropathy (HCC)   NSVT (nonsustained ventricular tachycardia) (HCC)   Chest pain with moderate risk of acute coronary syndrome   Chronic indwelling Foley catheter   Chronic diastolic CHF (congestive heart failure) ([place])   Diabetes type 2, uncontrolled ([place])   History of DVT (deep vein thrombosis)   Neurogenic bladder   Hyperlipidemia LDL goal <70   Type II diabetes mellitus with peripheral circulatory disorder, uncontrolled (HCC)   Morbid obesity (HCC)   Sinus pause   CAD in native artery   Left knee pain  Inability to ambulate due to knee   Unable to walk   Chronic venous stasis dermatitis of both lower extremities    Time spent: [place] MD [facility] Pager [phone]. If 7PM-7AM, please contact night-coverage at [email or web address], password [facility] 10/10/2015, 12:02 PM

## 2015-10-10 NOTE — Evaluation (Signed)
Occupational Therapy Evaluation Patient Details Name: [name] MRN: [id number] DOB: 08-Mar-1946 Today's Date: 10/10/2015    History of Present Illness [name] is an 70 y.o. male with a PMH of morbid obesity, hypertension, diabetic neuropathy, neurogenic bladder with chronic indwelling Foley, prior DVT who presents today after suffering from a mechanical fall at home. Although he thinks he tripped over the threshold while using his walker, he is unsure if he passed out.   Clinical Impression    Pt admitted with fall at home. Pt currently with functional limitations due to the deficits listed below (see OT Problem List).  Pt will benefit from skilled OT to increase their safety and independence with ADL and functional mobility for ADL to facilitate discharge to venue listed below.       Follow Up Recommendations  SNF (pt does not have 24/7)    Equipment Recommendations  None recommended by OT       Precautions / Restrictions Precautions Precaution Comments: pt appears to have brief episodes that he states he is not aware of      Mobility Bed Mobility Overal bed mobility: Needs Assistance Bed Mobility: Supine to Sit;Sit to Supine     Supine to sit: Supervision;HOB elevated Sit to supine: Min assist;Mod assist   General bed mobility comments: uses rails, requires incr time and assist to bring LEs onto bed and position once in supine; pt is able to long sit in bed briefly  Transfers Overall transfer level: Needs assistance               General transfer comment: did not perform- EEG came and needed pt to return to supine         ADL Overall ADL's : Needs assistance/impaired     Grooming: Set up;Sitting   Upper Body Bathing: Sitting;Minimal assitance   Lower Body Bathing: Maximal assistance;+2 for physical assistance;+2 for safety/equipment;Sitting/lateral leans (for sit to stand; min A for bathing)   Upper Body Dressing : Minimal  assistance;Sitting   Lower Body Dressing: Maximal assistance;+2 for physical assistance;+2 for safety/equipment;With adaptive equipment;Sitting/lateral leans (for sit to stand; mod A for dressing)                 General ADL Comments: Upon returning to supine pt had episode in which he stated he didnt recall sitting up- but then pt ask what OT was going to do with him               Pertinent Vitals/Pain Pain Assessment: Faces Faces Pain Scale: Hurts little more Pain Location: L knee Pain Descriptors / Indicators: Sore Pain Intervention(s): Monitored during session;Repositioned     Hand Dominance     Extremity/Trunk Assessment Upper Extremity Assessment Upper Extremity Assessment: RUE deficits/detail RUE Deficits / Details: WFL LUE Deficits / Details: L shoulder painful when lifting above 45 degrees           Communication Communication Communication: No difficulties   Cognition Arousal/Alertness: Awake/alert Behavior During Therapy: WFL for tasks assessed/performed Overall Cognitive Status: Impaired/Different from baseline Area of Impairment: Safety/judgement         Safety/Judgement: Decreased awareness of safety         General Comments       Exercises       Shoulder Instructions      Home Living Family/patient expects to be discharged to:: Skilled nursing facility Living Arrangements: Alone   Type of Home: House Home Access: Ramped entrance  Home Layout: One level         Bathroom Toilet: Handicapped height     Home Equipment: Cane - single point;Wheelchair - [employer] - 2 wheels;Walker - standard;Bedside commode          Prior Functioning/Environment Level of Independence: Needs assistance             OT Diagnosis: Generalized weakness;Acute pain   OT Problem List: Decreased strength;Decreased activity tolerance;Decreased knowledge of use of DME or AE;Pain;Impaired UE functional use;Decreased range of motion    OT Treatment/Interventions: Self-care/ADL training;DME and/or AE instruction;Patient/family education;Therapeutic activities    OT Goals(Current goals can be found in the care plan section) Acute Rehab OT Goals Patient Stated Goal: to go to rehab OT Goal Formulation: With patient Time For Goal Achievement: 10/17/15  OT Frequency: Min 2X/week   Barriers to D/C: Decreased caregiver support;Inaccessible home environment          Co-evaluation              End of Session Nurse Communication: Mobility status  Activity Tolerance: Patient tolerated treatment well Patient left: in bed;with call bell/phone within reach   Time: 1552-1605 OT Time Calculation (min): 13 min Charges:  OT General Charges $OT Visit: 1 Procedure OT Evaluation $OT Eval Low Complexity: 1 Procedure G-Codes: OT G-codes **NOT FOR INPATIENT CLASS** Functional Assessment Tool Used: clinical observation and judgment Functional Limitation: Self care Self Care Current Status [id number]): At least 80 percent but less than 100 percent impaired, limited or restricted Self Care Goal Status [id number]): At least 20 percent but less than 40 percent impaired, limited or restricted  Thrall, Thereasa Parkin 10/10/2015, 4:30 PM

## 2015-10-11 ENCOUNTER — Other Ambulatory Visit: Payer: Self-pay | Admitting: *Deleted

## 2015-10-11 DIAGNOSIS — I2510 Atherosclerotic heart disease of native coronary artery without angina pectoris: Secondary | ICD-10-CM | POA: Diagnosis not present

## 2015-10-11 DIAGNOSIS — E785 Hyperlipidemia, unspecified: Secondary | ICD-10-CM

## 2015-10-11 DIAGNOSIS — E1159 Type 2 diabetes mellitus with other circulatory complications: Secondary | ICD-10-CM | POA: Diagnosis not present

## 2015-10-11 DIAGNOSIS — I472 Ventricular tachycardia: Secondary | ICD-10-CM | POA: Diagnosis not present

## 2015-10-11 DIAGNOSIS — I491 Atrial premature depolarization: Secondary | ICD-10-CM | POA: Diagnosis not present

## 2015-10-11 DIAGNOSIS — I5033 Acute on chronic diastolic (congestive) heart failure: Secondary | ICD-10-CM | POA: Diagnosis not present

## 2015-10-11 DIAGNOSIS — E1140 Type 2 diabetes mellitus with diabetic neuropathy, unspecified: Secondary | ICD-10-CM | POA: Diagnosis not present

## 2015-10-11 DIAGNOSIS — E1165 Type 2 diabetes mellitus with hyperglycemia: Secondary | ICD-10-CM

## 2015-10-11 DIAGNOSIS — Z794 Long term (current) use of insulin: Secondary | ICD-10-CM | POA: Diagnosis not present

## 2015-10-11 DIAGNOSIS — I5032 Chronic diastolic (congestive) heart failure: Secondary | ICD-10-CM | POA: Diagnosis not present

## 2015-10-11 DIAGNOSIS — W19XXXD Unspecified fall, subsequent encounter: Secondary | ICD-10-CM | POA: Diagnosis not present

## 2015-10-11 DIAGNOSIS — I130 Hypertensive heart and chronic kidney disease with heart failure and stage 1 through stage 4 chronic kidney disease, or unspecified chronic kidney disease: Secondary | ICD-10-CM | POA: Diagnosis not present

## 2015-10-11 LAB — CBC
HCT: 34.4 % — ABNORMAL LOW (ref 39.0–52.0)
HEMOGLOBIN: 11.3 g/dL — AB (ref 13.0–17.0)
MCH: 29.7 pg (ref 26.0–34.0)
MCHC: 32.8 g/dL (ref 30.0–36.0)
MCV: 90.3 fL (ref 78.0–100.0)
Platelets: 186 10*3/uL (ref 150–400)
RBC: 3.81 MIL/uL — ABNORMAL LOW (ref 4.22–5.81)
RDW: 13.5 % (ref 11.5–15.5)
WBC: 6.5 10*3/uL (ref 4.0–10.5)

## 2015-10-11 LAB — BASIC METABOLIC PANEL
ANION GAP: 7 (ref 5–15)
BUN: 7 mg/dL (ref 6–20)
CALCIUM: 8.4 mg/dL — AB (ref 8.9–10.3)
CO2: 30 mmol/L (ref 22–32)
CREATININE: 0.66 mg/dL (ref 0.61–1.24)
Chloride: 99 mmol/L — ABNORMAL LOW (ref 101–111)
GFR calc Af Amer: >60 mL/min (ref >60)
GLUCOSE: 119 mg/dL — AB (ref 65–99)
POTASSIUM: 4.4 mmol/L (ref 3.5–5.1)
SODIUM: 136 mmol/L (ref 135–145)

## 2015-10-11 LAB — MAGNESIUM: MAGNESIUM: 1.8 mg/dL (ref 1.7–2.4)

## 2015-10-11 LAB — GLUCOSE, CAPILLARY
GLUCOSE-CAPILLARY: 98 mg/dL (ref 65–99)
GLUCOSE-CAPILLARY: 124 mg/dL — AB (ref 65–99)
GLUCOSE-CAPILLARY: 170 mg/dL — AB (ref 65–99)
Glucose-Capillary: 132 mg/dL — ABNORMAL HIGH (ref 65–99)

## 2015-10-11 MED ORDER — MAGNESIUM SULFATE 2 GM/50ML IV SOLN
2.0000 g | Freq: Once | INTRAVENOUS | Status: AC
  Administered 2015-10-11: 2 g via INTRAVENOUS
  Filled 2015-10-11: qty 50

## 2015-10-11 MED ORDER — NITROGLYCERIN 0.4 MG SL SUBL
0.4000 mg | SUBLINGUAL_TABLET | SUBLINGUAL | Status: DC | PRN
  Administered 2015-10-11 (×2): 0.4 mg via SUBLINGUAL
  Filled 2015-10-11: qty 1
  Filled 2015-10-11: qty 25

## 2015-10-11 NOTE — Progress Notes (Signed)
[facility] PROGRESS NOTE  [name] [id number] DOB: 06/30/1946 DOA: 10/08/2015 PCP: Irven Shelling, MD  Assessment/Plan: #1 Fall with possible syncope/inability to walk/left knee pain status post fall Patient on telemetry floor. Last 2-D echo done 07/04/2015 with EF of 55-60% with grade 1 diastolic dysfunction. 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 EEG pending. Urinalysis worrisome for UTI.  Urine cultures c/w pseudomonas. Continue ciprofloxacin. PT/OT. Likely needs placement.  #2 pseudomonas urinary tract infection Patient with chronic Foley catheter. Changed out Foley catheter.  Urine cultures c/w pseudomonas. Sensitivities pending. Continue ciprofloxacin.  #3 chest pain with moderate risk of acute coronary syndrome/coronary artery disease Patient denies any current chest pain. Cardiac enzymes negative 3. Continue aspirin. Monitor for now.  #4 chronic diastolic heart failure Stable. Patient seems euvolemic on examination. Continue lisinopril, Imdur, metoprolol, Lasix  #5 SINUS PAUSE/7 BEATS VTACH    Questionable etiology. Patient on a beta blocker ever had a 7 beat run of V. Tach yesterday.  Similar situation in prior hospitalization. Magnesium level of 1.8. Potassium of 4.4. Keep magnesium greater than 2 and potassium greater than 4. Cardiology has assessed patient and feel no further cardiac work up needed.   #5 neurogenic bladder/chronic indwelling Foley catheter Change Foley catheter out.  #6 diabetes type 2 CBG 98 - 132. Continue NPH and sliding scale insulin.  #7 hyperlipidemia Continue zetia  #8 history of DVT Continue prophylactic Lovenox.  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 Eucerin cream.   #11 prophylaxis PPI for GI prophylaxis. Lovenox for DVT prophylaxis.   Code Status: Full Family Communication: Updated patient, no family present. Disposition Plan: Needs  placement.??ALF vs SNF   Consultants:   cardiology: Dr Irish Lack 10/10/2015  Procedures: Left shoulder xray 10/08/2015 Plain films of the knee 10/08/2015 Acute abdominal series 10/08/2015  EEG 10/10/2015  Antibiotics:  IV ciprofloxacin 10/09/2015   HPI/Su isbjective: Patient states feeling a little better. Patient  Denies chest pain. Patient denies shortness of breath.   Objective: Filed Vitals:   10/11/15 0454 10/11/15 1000  BP: 105/86 127/52  Pulse: 68 67  Temp: 98.6 F (37 C)   Resp: 18     Intake/Output Summary (Last 24 hours) at 10/11/15 1304 Last data filed at 10/11/15 0817  Gross per 24 hour  Intake    483 ml  Output   4325 ml  Net  -3842 ml   Filed Weights   10/09/15 0642 10/10/15 0420 10/11/15 0454  Weight: 165.381 kg (364 lb 9.6 oz) 163.159 kg (359 lb 11.2 oz) 160.709 kg (354 lb 4.8 oz)    Exam:   General:  NAD  Cardiovascular: RRR  Respiratory: CTAB anterior lung fields.  Abdomen: Soft/NT/ND/+BS  Musculoskeletal: No c/c/e. Chronic venous stasis changes. Excoriations.  Data Reviewed: Basic Metabolic Panel:  Recent Labs Lab 10/08/15 0549 10/08/15 1014 10/09/15 0828 10/10/15 0530 10/11/15 0530  NA 137  --  135 138 136  K 3.9  --  3.7 3.4* 4.4  CL 102  --  101 100* 99*  CO2 25  --  27 31 30   GLUCOSE 268*  --  106* 86 119*  BUN 11  --  10 8 7   CREATININE 0.69 0.65 0.59* 0.61 0.66  CALCIUM 8.5*  --  7.9* 8.2* 8.4*  MG  --   --   --  1.5* 1.8   Liver Function Tests:  Recent Labs Lab 10/08/15 0549  AST 14*  ALT 10*  ALKPHOS 79  BILITOT 0.5  PROT 6.2*  ALBUMIN 2.9*    Recent Labs Lab 10/08/15 0549  LIPASE 20   No results for input(s): AMMONIA in the last 168 hours. CBC:  Recent Labs Lab 10/08/15 0549 10/08/15 1014 10/09/15 0828 10/10/15 0530 10/11/15 0530  WBC 10.3 9.6 7.4 7.1 6.5  NEUTROABS 7.0  --   --   --   --   HGB 12.6* 11.8* 11.2* 12.1* 11.3*  HCT 37.7* 35.2* 33.6* 36.5* 34.4*  MCV 91.3 89.3 90.1 92.2 90.3   PLT 187 183 184 201 186   Cardiac Enzymes:  Recent Labs Lab 10/08/15 0549 10/08/15 1014 10/08/15 1559 10/08/15 2221  TROPONINI <0.03 <0.03 <0.03 <0.03   BNP (last 3 results)  Recent Labs  12/22/14 2330 07/02/15 1439 09/23/15 0521  BNP 120.1* 320.3* 130.9*    ProBNP (last 3 results) No results for input(s): PROBNP in the last 8760 hours.  CBG:  Recent Labs Lab 10/10/15 1230 10/10/15 1704 10/10/15 2148 10/11/15 0752 10/11/15 1201  GLUCAP 170* 124* 188* 98 132*    Recent Results (from the past 240 hour(s))  MRSA PCR Screening     Status: Abnormal   Collection Time: 10/08/15  3:04 PM  Result Value Ref Range Status   MRSA by PCR POSITIVE (A) NEGATIVE Final    Comment:        The GeneXpert MRSA Assay (FDA approved for NASAL specimens only), is one component of a comprehensive MRSA colonization surveillance program. It is not intended to diagnose MRSA infection nor to guide or monitor treatment for MRSA infections. RESULT CALLED TO, READ BACK BY AND VERIFIED WITH: K.CHEEK RN AT [id number] ON 10/08/15 BY S.VANHOORNE   Culture, Urine     Status: None (Preliminary result)   Collection Time: 10/09/15  8:24 AM  Result Value Ref Range Status   Specimen Description URINE, CATHETERIZED  Final   Special Requests NONE  Final   Culture   Final    >=100,000 COLONIES/mL PSEUDOMONAS AERUGINOSA SUSCEPTIBILITIES TO FOLLOW Performed at [facility]    Report Status PENDING  Incomplete     Studies: No results found.  Scheduled Meds: . aspirin EC  81 mg Oral q morning - 10a  . Chlorhexidine Gluconate Cloth  6 each Topical Q0600  . ciprofloxacin  500 mg Oral BID  . enoxaparin (LOVENOX) injection  80 mg Subcutaneous Daily  . ezetimibe  10 mg Oral Daily  . furosemide  40 mg Oral Q M,W,F  . hydrocerin   Topical BID  . insulin aspart  0-20 Units Subcutaneous TID WC  . insulin aspart  0-5 Units Subcutaneous QHS  . insulin NPH Human  28 Units Subcutaneous BID AC & HS   . isosorbide mononitrate  60 mg Oral Daily  . lisinopril  5 mg Oral Daily  . loratadine  10 mg Oral Daily  . metoprolol  25 mg Oral BID  . mupirocin ointment  1 application Nasal BID  . pantoprazole  40 mg Oral Daily  . potassium chloride  40 mEq Oral Daily  . sodium chloride flush  3 mL Intravenous Q12H  . sodium chloride flush  3 mL Intravenous Q12H   Continuous Infusions:   Principal Problem:   Fall with possible syncope Active Problems:   Diabetic neuropathy (HCC)   NSVT (nonsustained ventricular tachycardia) (HCC)   Chest pain with moderate risk of acute coronary syndrome   Chronic indwelling Foley catheter   Chronic diastolic CHF (  congestive heart failure) ([place])   Diabetes type 2, uncontrolled ([place])   History of DVT (deep vein thrombosis)   Neurogenic bladder   Hyperlipidemia LDL goal <70   Type II diabetes mellitus with peripheral circulatory disorder, uncontrolled (HCC)   Morbid obesity (HCC)   Sinus pause   CAD in native artery   Left knee pain   Inability to ambulate due to knee   Unable to walk   Chronic venous stasis dermatitis of both lower extremities    Time spent: [place] Pager [phone]. If 7PM-7AM, please contact night-coverage at [email or web address], password [facility] 10/11/2015, 1:04 PM

## 2015-10-11 NOTE — Progress Notes (Addendum)
Physical Therapy Treatment Patient Details Name: [name] MRN: [id number] DOB: 1946/09/03 Today's Date: 10/11/2015    History of Present Illness [name] is an 70 y.o. male with a PMH of morbid obesity, hypertension, diabetic neuropathy, neurogenic bladder with chronic indwelling Foley, prior DVT who presents today after suffering from a mechanical fall at home. Although he thinks he tripped over the threshold while using his walker, he is unsure if he passed out.    PT Comments    Pt declined OOB, but agreeable to sitting EOB and performing LE therex.  Pt c/o pain L knee with exercises.  Follow Up Recommendations  SNF;Supervision/Assistance - 24 hour     Equipment Recommendations  None recommended by PT    Recommendations for Other Services       Precautions / Restrictions Precautions Precautions: Fall Precaution Comments: pt appears to have brief episodes that he states he is not aware of Restrictions Weight Bearing Restrictions: No    Mobility  Bed Mobility Overal bed mobility: Needs Assistance Bed Mobility: Supine to Sit;Sit to Supine     Supine to sit: Supervision;HOB elevated Sit to supine: Mod assist   General bed mobility comments: Heavy use of rail. A with legs for sit > supine. Pt able to pull self up using headboard.  Transfers                 General transfer comment: did not perform. Pt declined OOB.  Ambulation/Gait                 Stairs            Wheelchair Mobility    Modified Rankin (Stroke Patients Only)       Balance   Sitting-balance support: Feet supported Sitting balance-Leahy Scale: Good Sitting balance - Comments: body habitus increases BOS                            Cognition Arousal/Alertness: Awake/alert Behavior During Therapy: WFL for tasks assessed/performed Overall Cognitive Status: Impaired/Different from baseline Area of Impairment: Problem solving              Problem Solving: Slow processing General Comments: Pt with coffee spilled all over breakfast plate.  When asked if it was spilled, he stated he poured the coffee on the plate cause he didn't like the food.  Offered to get new plate or call food services, but pt declined.    Exercises General Exercises - Lower Extremity Ankle Circles/Pumps: Both;20 reps;Supine Long Arc Quad: 20 reps;Both;Seated Hip ABduction/ADduction: Both;20 reps;Seated Hip Flexion/Marching: Both;20 reps;Seated    General Comments        Pertinent Vitals/Pain Pain Assessment: 0-10 Pain Score: 7  Pain Location: L knee Pain Descriptors / Indicators:  (stinging) Pain Intervention(s): Limited activity within patient's tolerance;Monitored during session    Home Living                      Prior Function            PT Goals (current goals can now be found in the care plan section) Acute Rehab PT Goals Patient Stated Goal: to go to rehab PT Goal Formulation: With patient Time For Goal Achievement: 10/23/15 Potential to Achieve Goals: Fair Progress towards PT goals: Progressing toward goals    Frequency  Min 3X/week    PT Plan Current plan remains appropriate    Co-evaluation  End of Session   Activity Tolerance: Patient tolerated treatment well;Patient limited by pain Patient left: in bed;with call bell/phone within reach     Time: [id number] PT Time Calculation (min) (ACUTE ONLY): 20 min  Charges:  $Therapeutic Exercise: 8-22 mins                    G Codes:      [name] 10/11/2015, 9:23 AM

## 2015-10-11 NOTE — Progress Notes (Signed)
70 year old man with multiple medical problems including diabetes, hypertension, diabetic neuropathy and neurogenic bladder. He came to the hospital couple days ago after suffering a fall while at home.Of note, he has had a recent cardiac workup. Echocardiogram in late 2016 revealed normal left jugular function. He underwent cardiac catheterization at that time. Cardiac cath was complicated by a RCA dissection which spontaneously healed. No interventions were performed.    Subjective: Mild chest pain this AM. He does not think significant enough to warrant NTG  Objective: Vital signs in last 24 hours: Temp:  [98.1 F (36.7 C)-98.6 F (37 C)] 98.6 F (37 C) (01/31 0454) Pulse Rate:  [64-68] 68 (01/31 0454) Resp:  [18] 18 (01/31 0454) BP: (100-136)/(55-86) 105/86 mmHg (01/31 0454) SpO2:  [94 %-98 %] 97 % (01/31 0454) Weight:  [354 lb 4.8 oz (160.709 kg)] 354 lb 4.8 oz (160.709 kg) (01/31 0454) Weight change: -5 lb 6.4 oz (-2.449 kg) Last BM Date: 10/10/15 Intake/Output from previous day: -4515 01/30 0701 - 01/31 0700 In: 960 [P.O.:960] Out: 5475 [Urine:5475] Intake/Output this shift: Total I/O In: 3 [I.V.:3] Out: 1150 [Urine:1150]  PE:   General:Pleasant affect, NAD Skin:Warm and dry, brisk capillary refill HEENT:normocephalic, sclera clear, mucus membranes moist Heart:S1S2 RRR without murmur, gallup, rub or click Lungs:clear without rales, rhonchi, or wheezes [id number], non tender, + BS, do not palpate liver spleen or masses Ext:no lower ext edema, 2+ pedal pulses, 2+ radial pulses Neuro:alert and oriented, MAE, follows commands, + facial symmetry Tele:  SR with pauses, non conducted PACs and just pauses   Lab Results:  Recent Labs  10/10/15 0530 10/11/15 0530  WBC 7.1 6.5  HGB 12.1* 11.3*  HCT 36.5* 34.4*  PLT 201 186   BMET  Recent Labs  10/10/15 0530 10/11/15 0530  NA 138 136  K 3.4* 4.4  CL 100* 99*  CO2 31 30  GLUCOSE 86 119*  BUN 8 7   CREATININE 0.61 0.66  CALCIUM 8.2* 8.4*    Recent Labs  10/08/15 1559 10/08/15 2221  TROPONINI <0.03 <0.03    Lab Results  Component Value Date   CHOL 163 04/30/2015   HDL 30* 04/30/2015   LDLCALC 106* 04/30/2015   TRIG 133 04/30/2015   CHOLHDL 5.4 04/30/2015   Lab Results  Component Value Date   HGBA1C 10.4* 09/10/2015     Lab Results  Component Value Date   TSH 2.954 09/15/2015     Studies/Results: No results found.  Medications: I have reviewed the patient'Wood current medications. Scheduled Meds: . aspirin EC  81 mg Oral q morning - 10a  . Chlorhexidine Gluconate Cloth  6 each Topical Q0600  . ciprofloxacin  500 mg Oral BID  . enoxaparin (LOVENOX) injection  80 mg Subcutaneous Daily  . ezetimibe  10 mg Oral Daily  . furosemide  40 mg Oral Q M,W,F  . hydrocerin   Topical BID  . insulin aspart  0-20 Units Subcutaneous TID WC  . insulin aspart  0-5 Units Subcutaneous QHS  . insulin NPH Human  28 Units Subcutaneous BID AC & HS  . isosorbide mononitrate  60 mg Oral Daily  . lisinopril  5 mg Oral Daily  . loratadine  10 mg Oral Daily  . magnesium sulfate 1 - 4 g bolus IVPB  2 g Intravenous Once  . metoprolol  25 mg Oral BID  . mupirocin ointment  1 application Nasal BID  . pantoprazole  40 mg Oral Daily  .  potassium chloride  40 mEq Oral Daily  . sodium chloride flush  3 mL Intravenous Q12H  . sodium chloride flush  3 mL Intravenous Q12H   Continuous Infusions:  PRN Meds:.sodium chloride, acetaminophen, alum & mag hydroxide-simeth, diphenoxylate-atropine, LORazepam, nitroGLYCERIN, ondansetron **OR** ondansetron (ZOFRAN) IV, oxybutynin, senna-docusate, sodium chloride flush, traMADol  Assessment/Plan: Principal Problem:   Fall with possible syncope Active Problems:   Diabetic neuropathy (HCC)   NSVT (nonsustained ventricular tachycardia) (HCC)   Chest pain with moderate risk of acute coronary syndrome   Chronic indwelling Foley catheter   Chronic diastolic  CHF (congestive heart failure) ([place])   Diabetes type 2, uncontrolled ([place])   History of DVT (deep vein thrombosis)   Neurogenic bladder   Hyperlipidemia LDL goal <70   Type II diabetes mellitus with peripheral circulatory disorder, uncontrolled (HCC)   Morbid obesity (HCC)   Sinus pause   CAD in native artery   Left knee pain   Inability to ambulate due to knee   Unable to walk   Chronic venous stasis dermatitis of both lower extremities     Syncope: No obvious cardiac cause identified. With normal LV function, VT less likely. Tele reviewed.   Brief pauses are likely from blocked PACs.  Though sometimes longer pause.   No further management of CAD. Ruled out for MI.  Today + chest pain, he has every month or so an episode. If needed he takes NTG.  Does not believe he needs now.   DM: ? Whether hypoglycemia could explain the syncopal spell.   Knee pain and weakness in the leg, likely contributing to the falls.  BP with mild decrease with standing.   Time spent with pt. : 15 minutes. [facility] R  Nurse Practitioner Certified Pager [phone] or after 5pm and on weekends call [phone] 10/11/2015, 8:43 AM   I have examined the patient and reviewed assessment and plan and discussed with patient.  Agree with above as stated.   I personally reviewed telemetry.  Pauses around 2 seconds noted.  Nothing longer.  WOuld not plan any further cardiac testing at this time.  Now thge patient is saying that he is not sure that he really passes out fully with these episodes.  No clear indication for a pacer.  [name].

## 2015-10-11 NOTE — Patient Outreach (Signed)
[facility]) Care Management  10/11/2015  [name] 29-Feb-1946 [id number]   [name] was able to make contact with patient today to follow-up with him regarding his most recent hospitalization and need for skilled nursing placement upon release.  Patient is working with APS (Adult Scientist, forensic) Worker, [name] with the [facility], as well as [name], [name] at [facility] to make appropriate discharge arrangements for patient.  Patient continues to be hesitant to be placed at any type of facility, whether it be a nursing home for short-term rehabilitative services or an assisted living facility for long-term care services.  [name] provided [name] with a pending Medicaid number for placement purposes, as patient is unable to utilize his Medicare benefit.  [name] is hopeful that [name], Admissions Coordinator at [facility] will be willing to accept patient for placement once patient is medically stable and ready for discharge from the hospital.  [name] will then work with Mrs. Shary Decamp to ensure that patient does not get discharged from the skilled facility back home, where patient is definitely not safe to live alone.  [name] will continue to follow along to assist with discharge planning needs and services.  In the meantime, [name] will talk with patient about having [facility] [employer] come into his home to begin the cleaning process to possibly place patient's home on the market, as this option had been discussed with patient during previous conversations.  [name], BSW, MSW, LCSW  Licensed Education officer, [facility]  [address] [place] [address] [place], [place] [address] Physical Address-[address], [place], [place] [address] Toll Free Main # [phone] Fax # [phone] Cell # [phone]  Fax #  [phone]  [name] complies with [employer] civil rights laws and does not discriminate on the basis of race, color, national origin, age, disability, or sex.  Espaol (Spanish)  [facility] cumple con las leyes federales de derechos civiles aplicables y no discrimina por motivos de raza, color, nacionalidad, edad, discapacidad o sexo.     [place]t ([place])  [place] tun th? lu?t dn quy?n hi?n hnh c?a Lin bang v khng phn bi?t ?i x? d?a trn ch?ng t?c, mu da, ngu?n g?c qu?c gia, ? tu?i, khuy?t t?t, ho?c gi?i tnh.     (Arabic)

## 2015-10-12 ENCOUNTER — Observation Stay (HOSPITAL_COMMUNITY): Payer: Medicare Other

## 2015-10-12 ENCOUNTER — Other Ambulatory Visit: Payer: Self-pay | Admitting: *Deleted

## 2015-10-12 DIAGNOSIS — I130 Hypertensive heart and chronic kidney disease with heart failure and stage 1 through stage 4 chronic kidney disease, or unspecified chronic kidney disease: Secondary | ICD-10-CM | POA: Diagnosis present

## 2015-10-12 DIAGNOSIS — Z8043 Family history of malignant neoplasm of testis: Secondary | ICD-10-CM | POA: Diagnosis not present

## 2015-10-12 DIAGNOSIS — R079 Chest pain, unspecified: Secondary | ICD-10-CM | POA: Diagnosis not present

## 2015-10-12 DIAGNOSIS — R55 Syncope and collapse: Secondary | ICD-10-CM

## 2015-10-12 DIAGNOSIS — E1151 Type 2 diabetes mellitus with diabetic peripheral angiopathy without gangrene: Secondary | ICD-10-CM

## 2015-10-12 DIAGNOSIS — Z8673 Personal history of transient ischemic attack (TIA), and cerebral infarction without residual deficits: Secondary | ICD-10-CM | POA: Diagnosis not present

## 2015-10-12 DIAGNOSIS — I2510 Atherosclerotic heart disease of native coronary artery without angina pectoris: Secondary | ICD-10-CM | POA: Diagnosis not present

## 2015-10-12 DIAGNOSIS — Z8701 Personal history of pneumonia (recurrent): Secondary | ICD-10-CM | POA: Diagnosis not present

## 2015-10-12 DIAGNOSIS — E1165 Type 2 diabetes mellitus with hyperglycemia: Secondary | ICD-10-CM | POA: Diagnosis present

## 2015-10-12 DIAGNOSIS — Z8249 Family history of ischemic heart disease and other diseases of the circulatory system: Secondary | ICD-10-CM | POA: Diagnosis not present

## 2015-10-12 DIAGNOSIS — Z86718 Personal history of other venous thrombosis and embolism: Secondary | ICD-10-CM | POA: Diagnosis not present

## 2015-10-12 DIAGNOSIS — L309 Dermatitis, unspecified: Secondary | ICD-10-CM | POA: Diagnosis present

## 2015-10-12 DIAGNOSIS — Z888 Allergy status to other drugs, medicaments and biological substances status: Secondary | ICD-10-CM | POA: Diagnosis not present

## 2015-10-12 DIAGNOSIS — I8311 Varicose veins of right lower extremity with inflammation: Secondary | ICD-10-CM | POA: Diagnosis not present

## 2015-10-12 DIAGNOSIS — Z881 Allergy status to other antibiotic agents status: Secondary | ICD-10-CM | POA: Diagnosis not present

## 2015-10-12 DIAGNOSIS — T83518A Infection and inflammatory reaction due to other urinary catheter, initial encounter: Secondary | ICD-10-CM | POA: Diagnosis present

## 2015-10-12 DIAGNOSIS — N189 Chronic kidney disease, unspecified: Secondary | ICD-10-CM | POA: Diagnosis present

## 2015-10-12 DIAGNOSIS — Z833 Family history of diabetes mellitus: Secondary | ICD-10-CM | POA: Diagnosis not present

## 2015-10-12 DIAGNOSIS — Z9289 Personal history of other medical treatment: Secondary | ICD-10-CM | POA: Diagnosis not present

## 2015-10-12 DIAGNOSIS — Z87891 Personal history of nicotine dependence: Secondary | ICD-10-CM | POA: Diagnosis not present

## 2015-10-12 DIAGNOSIS — N319 Neuromuscular dysfunction of bladder, unspecified: Secondary | ICD-10-CM | POA: Diagnosis present

## 2015-10-12 DIAGNOSIS — Y92009 Unspecified place in unspecified non-institutional (private) residence as the place of occurrence of the external cause: Secondary | ICD-10-CM | POA: Diagnosis not present

## 2015-10-12 DIAGNOSIS — Z7982 Long term (current) use of aspirin: Secondary | ICD-10-CM | POA: Diagnosis not present

## 2015-10-12 DIAGNOSIS — G4733 Obstructive sleep apnea (adult) (pediatric): Secondary | ICD-10-CM | POA: Diagnosis present

## 2015-10-12 DIAGNOSIS — W19XXXD Unspecified fall, subsequent encounter: Secondary | ICD-10-CM | POA: Diagnosis not present

## 2015-10-12 DIAGNOSIS — R262 Difficulty in walking, not elsewhere classified: Secondary | ICD-10-CM

## 2015-10-12 DIAGNOSIS — R0602 Shortness of breath: Secondary | ICD-10-CM | POA: Diagnosis not present

## 2015-10-12 DIAGNOSIS — Z79899 Other long term (current) drug therapy: Secondary | ICD-10-CM | POA: Diagnosis not present

## 2015-10-12 DIAGNOSIS — I5032 Chronic diastolic (congestive) heart failure: Secondary | ICD-10-CM | POA: Diagnosis not present

## 2015-10-12 DIAGNOSIS — E1122 Type 2 diabetes mellitus with diabetic chronic kidney disease: Secondary | ICD-10-CM | POA: Diagnosis present

## 2015-10-12 DIAGNOSIS — M25562 Pain in left knee: Secondary | ICD-10-CM | POA: Diagnosis present

## 2015-10-12 DIAGNOSIS — W19XXXA Unspecified fall, initial encounter: Secondary | ICD-10-CM | POA: Diagnosis present

## 2015-10-12 DIAGNOSIS — I5033 Acute on chronic diastolic (congestive) heart failure: Secondary | ICD-10-CM | POA: Diagnosis present

## 2015-10-12 DIAGNOSIS — K219 Gastro-esophageal reflux disease without esophagitis: Secondary | ICD-10-CM | POA: Diagnosis present

## 2015-10-12 DIAGNOSIS — Z6841 Body Mass Index (BMI) 40.0 and over, adult: Secondary | ICD-10-CM | POA: Diagnosis not present

## 2015-10-12 DIAGNOSIS — I878 Other specified disorders of veins: Secondary | ICD-10-CM | POA: Diagnosis present

## 2015-10-12 DIAGNOSIS — I509 Heart failure, unspecified: Secondary | ICD-10-CM | POA: Insufficient documentation

## 2015-10-12 DIAGNOSIS — L97419 Non-pressure chronic ulcer of right heel and midfoot with unspecified severity: Secondary | ICD-10-CM | POA: Diagnosis present

## 2015-10-12 DIAGNOSIS — B9561 Methicillin susceptible Staphylococcus aureus infection as the cause of diseases classified elsewhere: Secondary | ICD-10-CM | POA: Diagnosis present

## 2015-10-12 DIAGNOSIS — M25512 Pain in left shoulder: Secondary | ICD-10-CM | POA: Diagnosis present

## 2015-10-12 DIAGNOSIS — B965 Pseudomonas (aeruginosa) (mallei) (pseudomallei) as the cause of diseases classified elsewhere: Secondary | ICD-10-CM | POA: Diagnosis present

## 2015-10-12 DIAGNOSIS — Y846 Urinary catheterization as the cause of abnormal reaction of the patient, or of later complication, without mention of misadventure at the time of the procedure: Secondary | ICD-10-CM | POA: Diagnosis present

## 2015-10-12 DIAGNOSIS — Z806 Family history of leukemia: Secondary | ICD-10-CM | POA: Diagnosis not present

## 2015-10-12 DIAGNOSIS — E11621 Type 2 diabetes mellitus with foot ulcer: Secondary | ICD-10-CM | POA: Diagnosis present

## 2015-10-12 DIAGNOSIS — I872 Venous insufficiency (chronic) (peripheral): Secondary | ICD-10-CM | POA: Diagnosis present

## 2015-10-12 DIAGNOSIS — Z885 Allergy status to narcotic agent status: Secondary | ICD-10-CM | POA: Diagnosis not present

## 2015-10-12 DIAGNOSIS — E1159 Type 2 diabetes mellitus with other circulatory complications: Secondary | ICD-10-CM | POA: Diagnosis not present

## 2015-10-12 DIAGNOSIS — E785 Hyperlipidemia, unspecified: Secondary | ICD-10-CM | POA: Diagnosis present

## 2015-10-12 DIAGNOSIS — R259 Unspecified abnormal involuntary movements: Secondary | ICD-10-CM | POA: Diagnosis not present

## 2015-10-12 DIAGNOSIS — Z794 Long term (current) use of insulin: Secondary | ICD-10-CM | POA: Diagnosis not present

## 2015-10-12 DIAGNOSIS — Z96651 Presence of right artificial knee joint: Secondary | ICD-10-CM | POA: Diagnosis present

## 2015-10-12 DIAGNOSIS — N390 Urinary tract infection, site not specified: Secondary | ICD-10-CM | POA: Diagnosis present

## 2015-10-12 DIAGNOSIS — G8929 Other chronic pain: Secondary | ICD-10-CM | POA: Diagnosis present

## 2015-10-12 DIAGNOSIS — I472 Ventricular tachycardia: Secondary | ICD-10-CM | POA: Diagnosis present

## 2015-10-12 DIAGNOSIS — R001 Bradycardia, unspecified: Secondary | ICD-10-CM | POA: Diagnosis present

## 2015-10-12 DIAGNOSIS — E1140 Type 2 diabetes mellitus with diabetic neuropathy, unspecified: Secondary | ICD-10-CM | POA: Diagnosis present

## 2015-10-12 DIAGNOSIS — M545 Low back pain: Secondary | ICD-10-CM | POA: Diagnosis present

## 2015-10-12 LAB — BASIC METABOLIC PANEL
Anion gap: 6 (ref 5–15)
BUN: 8 mg/dL (ref 6–20)
CHLORIDE: 97 mmol/L — AB (ref 101–111)
CO2: 33 mmol/L — ABNORMAL HIGH (ref 22–32)
CREATININE: 0.78 mg/dL (ref 0.61–1.24)
Calcium: 8.7 mg/dL — ABNORMAL LOW (ref 8.9–10.3)
GFR calc Af Amer: >60 mL/min (ref >60)
GFR calc non Af Amer: >60 mL/min (ref >60)
GLUCOSE: 187 mg/dL — AB (ref 65–99)
Potassium: 5 mmol/L (ref 3.5–5.1)
SODIUM: 136 mmol/L (ref 135–145)

## 2015-10-12 LAB — CBC
HCT: 36.1 % — ABNORMAL LOW (ref 39.0–52.0)
HEMOGLOBIN: 11.8 g/dL — AB (ref 13.0–17.0)
MCH: 30.6 pg (ref 26.0–34.0)
MCHC: 32.7 g/dL (ref 30.0–36.0)
MCV: 93.5 fL (ref 78.0–100.0)
Platelets: 195 10*3/uL (ref 150–400)
RBC: 3.86 MIL/uL — ABNORMAL LOW (ref 4.22–5.81)
RDW: 13.7 % (ref 11.5–15.5)
WBC: 5.9 10*3/uL (ref 4.0–10.5)

## 2015-10-12 LAB — GLUCOSE, CAPILLARY
GLUCOSE-CAPILLARY: 170 mg/dL — AB (ref 65–99)
Glucose-Capillary: 187 mg/dL — ABNORMAL HIGH (ref 65–99)
Glucose-Capillary: 132 mg/dL — ABNORMAL HIGH (ref 65–99)

## 2015-10-12 MED ORDER — FOSFOMYCIN TROMETHAMINE 3 G PO PACK
3.0000 g | PACK | Freq: Once | ORAL | Status: DC
  Filled 2015-10-12: qty 3

## 2015-10-12 MED ORDER — FUROSEMIDE 10 MG/ML IJ SOLN
40.0000 mg | Freq: Once | INTRAMUSCULAR | Status: AC
  Administered 2015-10-12: 40 mg via INTRAVENOUS
  Filled 2015-10-12: qty 4

## 2015-10-12 MED ORDER — FOSFOMYCIN TROMETHAMINE 3 G PO PACK
3.0000 g | PACK | Freq: Once | ORAL | Status: AC
  Administered 2015-10-12: 3 g via ORAL
  Filled 2015-10-12: qty 3

## 2015-10-12 MED ORDER — FUROSEMIDE 10 MG/ML IJ SOLN
40.0000 mg | Freq: Two times a day (BID) | INTRAMUSCULAR | Status: DC
  Administered 2015-10-12 – 2015-10-13 (×2): 40 mg via INTRAVENOUS
  Filled 2015-10-12 (×2): qty 4

## 2015-10-12 NOTE — Progress Notes (Signed)
OT Cancellation Note  Patient Details Name: [name] MRN: [id number] DOB: Jul 11, 1946   Cancelled Treatment:    Reason Eval/Treat Not Completed: Other (comment).  Pt involved in phone conversation, which sounded like d/c planning. Pt did not acknowledge OT/PT.  Will check back another day.  [place] 10/12/2015, 3:50 PM  Lesle [name], OTR/L [id number] 10/12/2015   Weston Anna, MPT [phone]

## 2015-10-12 NOTE — Progress Notes (Signed)
Patient refusing to wear prevalon boots. Patient says right foot will not fit into boot and he will not pay for the prevalon boots. Patient was very adamant about not wearing prevalon boots.

## 2015-10-12 NOTE — Progress Notes (Signed)
70 year old man with multiple medical problems including diabetes, hypertension, diabetic neuropathy and neurogenic bladder. He came to the hospital couple days ago after suffering a fall while at home.Of note, he has had a recent cardiac workup. Echocardiogram in late 2016 revealed normal left ventricular function. He underwent cardiac catheterization at that time. Cardiac cath was complicated by a RCA dissection which spontaneously healed. No interventions were performed.  Now seen for possible syncope at times 2 sec pauses.   Subjective: Complained of chest fullness. He asked for more diuretic.   Objective: Vital signs in last 24 hours: Temp:  [98.3 F (36.8 C)-98.4 F (36.9 C)] 98.3 F (36.8 C) (02/01 0500) Pulse Rate:  [65-86] 65 (02/01 0500) Resp:  [18] 18 (02/01 0500) BP: (101-127)/(52-67) 106/67 mmHg (02/01 0500) SpO2:  [97 %-98 %] 97 % (02/01 0500) Weight:  [355 lb 1.6 oz (161.072 kg)] 355 lb 1.6 oz (161.072 kg) (02/01 0500) Weight change: 12.8 oz (0.363 kg) Last BM Date: 10/10/15 Intake/Output from previous day: -5627 (-16,182 since admit) 01/31 0701 - 02/01 0700 In: 1523 [P.O.:1520; I.V.:3] Out: 7150 [Urine:7150] Intake/Output this shift:    PE: General:Pleasant affect, NAD Skin:Warm and dry, brisk capillary refill HEENT:normocephalic, sclera clear, mucus membranes moist Heart:S1S2 RRR without murmur, gallup, rub or click Lungs:clear without rales, rhonchi, or wheezes [id number], soft, non tender, + BS, do not palpate liver spleen or masses [id number] lower ext edema with legs tight and scratches over legs,  2+ radial pulses Neuro:alert and oriented, MAE, follows commands, + facial symmetry Tele SR with occ pause   Lab Results:  Recent Labs  10/11/15 0530 10/12/15 0536  WBC 6.5 5.9  HGB 11.3* 11.8*  HCT 34.4* 36.1*  PLT 186 195   BMET  Recent Labs  10/11/15 0530 10/12/15 0536  NA 136 136  K 4.4 5.0  CL 99* 97*  CO2 30 33*  GLUCOSE 119*  187*  BUN 7 8  CREATININE 0.66 0.78  CALCIUM 8.4* 8.7*   No results for input(s): TROPONINI in the last 72 hours.  Invalid input(s): CK, MB  Lab Results  Component Value Date   CHOL 163 04/30/2015   HDL 30* 04/30/2015   LDLCALC 106* 04/30/2015   TRIG 133 04/30/2015   CHOLHDL 5.4 04/30/2015   Lab Results  Component Value Date   HGBA1C 10.4* 09/10/2015     Lab Results  Component Value Date   TSH 2.954 09/15/2015       Studies/Results: No results found.  Medications: I have reviewed the patient's current medications. Scheduled Meds: . aspirin EC  81 mg Oral q morning - 10a  . Chlorhexidine Gluconate Cloth  6 each Topical Q0600  . enoxaparin (LOVENOX) injection  80 mg Subcutaneous Daily  . ezetimibe  10 mg Oral Daily  . fosfomycin  3 g Oral Once  . [START ON 10/15/2015] fosfomycin  3 g Oral Once  . furosemide  40 mg Intravenous Once  . furosemide  40 mg Oral Q M,W,F  . hydrocerin   Topical BID  . insulin aspart  0-20 Units Subcutaneous TID WC  . insulin aspart  0-5 Units Subcutaneous QHS  . insulin NPH Human  28 Units Subcutaneous BID AC & HS  . isosorbide mononitrate  60 mg Oral Daily  . lisinopril  5 mg Oral Daily  . loratadine  10 mg Oral Daily  . metoprolol  25 mg Oral BID  . mupirocin ointment  1 application Nasal BID  .  pantoprazole  40 mg Oral Daily  . potassium chloride  40 mEq Oral Daily  . sodium chloride flush  3 mL Intravenous Q12H  . sodium chloride flush  3 mL Intravenous Q12H   Continuous Infusions:  PRN Meds:.sodium chloride, acetaminophen, alum & mag hydroxide-simeth, diphenoxylate-atropine, LORazepam, nitroGLYCERIN, ondansetron **OR** ondansetron (ZOFRAN) IV, oxybutynin, senna-docusate, sodium chloride flush, traMADol  Assessment/Plan: Principal Problem:   Fall with possible syncope Active Problems:   Diabetic neuropathy (HCC)   NSVT (nonsustained ventricular tachycardia) (HCC)   Chest pain with moderate risk of acute coronary syndrome    Chronic indwelling Foley catheter   Chronic diastolic CHF (congestive heart failure) ([place])   Diabetes type 2, uncontrolled ([place])   History of DVT (deep vein thrombosis)   Neurogenic bladder   Hyperlipidemia LDL goal <70   Type II diabetes mellitus with peripheral circulatory disorder, uncontrolled (HCC)   Morbid obesity (HCC)   Sinus pause   CAD in native artery   Left knee pain   Inability to ambulate due to knee   Unable to walk   Chronic venous stasis dermatitis of both lower extremities  Syncope: No obvious cardiac cause identified. With normal LV function, VT less likely. Tele reviewed. has up 2 sec pauses on occ- pt not sure if he did pass out.  No further work up planned.  Brief pauses are likely from blocked PACs. Though sometimes longer pause.   No further management of CAD. Ruled out for MI. Today + chest pain, he has every month or so an episode. If needed he takes NTG. Does not believe he needs now.  EKG stable. Still with some chest pain he is on protonix and Imdur ? Increase imdur vs.wait and see how IV lasix does.   DM: ? Whether hypoglycemia could explain the syncopal spell.   Knee pain and weakness in the leg, likely contributing to the falls.  BP with mild decrease with standing.  Volume overload  -16182 since admit on lasix 40 po MWF, now with feeling he is "fuller" receiving one dose 40 mg IV. CXR pending.       Time spent with pt. :15 minutes. [facility] R  Nurse Practitioner Certified Pager [phone] or after 5pm and on weekends call [phone] 10/12/2015, 9:41 AM  I have examined the patient and reviewed assessment and plan and discussed with patient.  Agree with above as stated.  No cardiac cause of syncope identified.  I suspect his fall was related to his other medical issues, weakness and obesity.  Living at home for him has been difficult.  I agree with the patient that he should not drive due to the possible syncopal spells that he has had.  He  voluntarily stopped driving which I think is wise and I told him he is not allowed to drive with recent syncope.    Brian Raborn S.

## 2015-10-12 NOTE — Progress Notes (Signed)
[facility] PROGRESS NOTE  OAKS CRONISTER [id number] DOB: [birth year] DOA: 10/08/2015 PCP: Irven Shelling, MD  Brief interval history [name] is an 70 y.o. male with a PMH of morbid obesity, hypertension, diabetic neuropathy, neurogenic bladder with chronic indwelling Foley, prior DVT who presented on day of admission, after suffering from a mechanical fall at home. Although he thinks he tripped over the threshold while using his walker, he is unsure if he passed out. Describes having frequent spells where he loses time. He fell onto his left side and had left knee pain following the fall with inability to ambulate. He also reported some left shoulder pain. Initially described the pain as 8/10. Also reports upper abdominal pain that was present prior to his fall and, but has worsened since the fall as well as one episode of emesis. No recent fevers. Patient has had 6 inpatient hospitalizations, many with similar issues and has been resistant to nursing home placement in the past. Is clear that he cannot adequately care for himself at home. He is largely chair bound. A radiographic evaluation was negative for any fracture. We were asked to admit the patient secondary to his current complaints of pain and his inability to ambulate and therefore care for himself in the home. Of note, the patient has been under the care of [facility]/social work at home, and [facility] [employer] were involved in attempting to get the patient placed due to his inability to adequately care for himself.  Patient was admitted urinalysis which was drawn was worrisome for UTI. Foley catheter was changed out. Urine cultures came back positive for Pseudomonas and per microbiology batch highly resistant and only sensitive to Ceptaz edema. Patient with allergies to cephalosporins and a such patient has been changed from ciprofloxacin to fosfomycin in discussion with ID. Patient also noted to have complaints of  shortness of breath on 10/12/2015. Chest x-ray done was consistent with volume overload. Patient currently on Lasix 40 mg IV every 12 hours. Cardiology following.      Assessment/Plan: #1 Fall with possible syncope/inability to walk/left knee pain status post fall Patient on telemetry floor. Last 2-D echo done 07/04/2015 with EF of 55-60% with grade 1 diastolic dysfunction. 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 EEG done however results pending. Urinalysis worrisome for UTI.  Urine cultures c/w pseudomonas which is highly resistant and sensitive to Ceptaz edema and amikacin per report from microbiology bench. Discontinue ciprofloxacin and place patient on fosfomycin. See problem #2. PT/OT. Likely needs placement.  #2 pseudomonas urinary tract infection Patient with chronic Foley catheter. Urine cultures c/w pseudomonas. Spoke with microbiology bench and pseudomonas in patient's urine culture is sensitive to Ceftin opacity and amikacin however resistant to the quinolones and piperacillin tazobactam and imipenem. Patient does have a chronic indwelling Foley catheter and could likely be contamination however patient had complaints of chills and a such will treat. Will discontinue ciprofloxacin. Place patient on fosfomycin 3 g by mouth 1 and repeat in 3 days. Discussed with infectious diseases.   #3 chest pain with moderate risk of acute coronary syndrome/coronary artery disease Patient denies any current chest pain. Cardiac enzymes negative 3. Continue aspirin. ?? Increased Imdur dose will defer to cardiology. Monitor for now.  #4 acute on chronic diastolic heart failure Patient with complaints of shortness of breath today after he was likely secondary to acute on chronic heart failure. Chest x-ray was obtained consistent with pulmonary congestion. Patient states  usually takes his home Lasix on a daily basis however on his med rec he was taking it 3 times  weekly and as such this has been corrected. We'll place patient on Lasix 40 mg IV every 12 hours. Strict I's and O's. Daily weights. Follow. Was euvolemic will transition to home dose oral Lasix of 40 mg daily. Continue lisinopril, Imdur, metoprolol, Lasix  #5 SINUS PAUSE/7 BEATS VTACH    Questionable etiology. Patient on a beta blocker ever had a 7 beat run of V. Tach yesterday.  Similar situation in prior hospitalization. Magnesium level of 1.8. Potassium of 4.4. Keep magnesium greater than 2 and potassium greater than 4. Cardiology has assessed patient and feel no further cardiac work up needed.   #5 neurogenic bladder/chronic indwelling Foley catheter Exchanged Foley catheter.  #6 diabetes type 2 CBG 124-170. Continue NPH and sliding scale insulin.  #7 hyperlipidemia   #8 history of DVT Continue prophylactic Lovenox.  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 Eucerin cream.   #11 prophylaxis PPI for GI prophylaxis. Lovenox for DVT prophylaxis.   Code Status: Full Family Communication: Updated patient, no family present. Disposition Plan: Needs placement.??ALF vs SNF   Consultants:   cardiology: Dr Irish Lack 10/10/2015  Procedures: Left shoulder xray 10/08/2015 Plain films of the knee 10/08/2015 Acute abdominal series 10/08/2015  EEG 10/10/2015 results pending  Antibiotics:  IV ciprofloxacin 10/09/2015>>> oral ciprofloxacin>>> 10/12/2015  Oral fosfomycin 10/12/2015  HPI/Su isbjective: Patient complaining of shortness of breath. Patient feels he is getting volume overloaded. Since states he usually takes his oral diuretics on a daily basis however he's been getting it during the hospitalization 3 times a week. Patient denies any chest pain.   Objective: Filed Vitals:   10/11/15 2031 10/12/15 0500  BP: 101/67 106/67  Pulse: 86 65  Temp: 98.4 F (36.9 C) 98.3 F (36.8 C)  Resp: 18 18    Intake/Output Summary (Last 24 hours) at 10/12/15  1101 Last data filed at 10/12/15 0500  Gross per 24 hour  Intake   1280 ml  Output   6000 ml  Net  -4720 ml   Filed Weights   10/10/15 0420 10/11/15 0454 10/12/15 0500  Weight: 163.159 kg (359 lb 11.2 oz) 160.709 kg (354 lb 4.8 oz) 161.072 kg (355 lb 1.6 oz)    Exam:   General:  NAD  Cardiovascular: RRR  Respiratory: Bibasilar crackles.  Abdomen: Soft/NT/ND/+BS  Musculoskeletal: No c/c/. Trace to +1 bilateral lower extremity edema. Chronic venous stasis changes. Excoriations.  Data Reviewed: Basic Metabolic Panel:  Recent Labs Lab 10/08/15 0549 10/08/15 1014 10/09/15 0828 10/10/15 0530 10/11/15 0530 10/12/15 0536  NA 137  --  135 138 136 136  K 3.9  --  3.7 3.4* 4.4 5.0  CL 102  --  101 100* 99* 97*  CO2 25  --  27 31 30  33*  GLUCOSE 268*  --  106* 86 119* 187*  BUN 11  --  10 8 7 8   CREATININE 0.69 0.65 0.59* 0.61 0.66 0.78  CALCIUM 8.5*  --  7.9* 8.2* 8.4* 8.7*  MG  --   --   --  1.5* 1.8  --    Liver Function Tests:  Recent Labs Lab 10/08/15 0549  AST 14*  ALT 10*  ALKPHOS 79  BILITOT 0.5  PROT 6.2*  ALBUMIN 2.9*    Recent Labs Lab 10/08/15 0549  LIPASE 20   No results for input(s): AMMONIA in the  last 168 hours. CBC:  Recent Labs Lab 10/08/15 0549 10/08/15 1014 10/09/15 0828 10/10/15 0530 10/11/15 0530 10/12/15 0536  WBC 10.3 9.6 7.4 7.1 6.5 5.9  NEUTROABS 7.0  --   --   --   --   --   HGB 12.6* 11.8* 11.2* 12.1* 11.3* 11.8*  HCT 37.7* 35.2* 33.6* 36.5* 34.4* 36.1*  MCV 91.3 89.3 90.1 92.2 90.3 93.5  PLT 187 183 184 201 186 195   Cardiac Enzymes:  Recent Labs Lab 10/08/15 0549 10/08/15 1014 10/08/15 1559 10/08/15 2221  TROPONINI <0.03 <0.03 <0.03 <0.03   BNP (last 3 results)  Recent Labs  12/22/14 2330 07/02/15 1439 09/23/15 0521  BNP 120.1* 320.3* 130.9*    ProBNP (last 3 results) No results for input(s): PROBNP in the last 8760 hours.  CBG:  Recent Labs Lab 10/11/15 0752 10/11/15 1201 10/11/15 1658  10/11/15 2122 10/12/15 0806  GLUCAP 98 132* 124* 170* 170*    Recent Results (from the past 240 hour(s))  MRSA PCR Screening     Status: Abnormal   Collection Time: 10/08/15  3:04 PM  Result Value Ref Range Status   MRSA by PCR POSITIVE (A) NEGATIVE Final    Comment:        The GeneXpert MRSA Assay (FDA approved for NASAL specimens only), is one component of a comprehensive MRSA colonization surveillance program. It is not intended to diagnose MRSA infection nor to guide or monitor treatment for MRSA infections. RESULT CALLED TO, READ BACK BY AND VERIFIED WITH: K.CHEEK RN AT [id number] ON 10/08/15 BY S.VANHOORNE   Culture, Urine     Status: None (Preliminary result)   Collection Time: 10/09/15  8:24 AM  Result Value Ref Range Status   Specimen Description URINE, CATHETERIZED  Final   Special Requests NONE  Final   Culture   Final    >=100,000 COLONIES/mL PSEUDOMONAS AERUGINOSA SUSCEPTIBILITIES TO FOLLOW Performed at [facility]    Report Status PENDING  Incomplete     Studies: Dg Chest Port 1 View  10/12/2015  CLINICAL DATA:  Shortness of breath.  Former smoker. EXAM: PORTABLE CHEST 1 VIEW COMPARISON:  10/08/2015 FINDINGS: The cardiac silhouette is enlarged. Mediastinal contours appear intact. There is no evidence of focal airspace consolidation, pleural effusion or pneumothorax. There is persistent pulmonary vascular congestion. Osseous structures are without acute abnormality. Soft tissues are grossly normal. IMPRESSION: Enlarged cardiac silhouette. Pulmonary vascular congestion. Electronically Signed   By: Fidela Salisbury M.D.   On: 10/12/2015 09:50    Scheduled Meds: . aspirin EC  81 mg Oral q morning - 10a  . Chlorhexidine Gluconate Cloth  6 each Topical Q0600  . enoxaparin (LOVENOX) injection  80 mg Subcutaneous Daily  . ezetimibe  10 mg Oral Daily  . fosfomycin  3 g Oral Once  . [START ON 10/15/2015] fosfomycin  3 g Oral Once  . furosemide  40 mg Intravenous  Once  . furosemide  40 mg Oral Q M,W,F  . hydrocerin   Topical BID  . insulin aspart  0-20 Units Subcutaneous TID WC  . insulin aspart  0-5 Units Subcutaneous QHS  . insulin NPH Human  28 Units Subcutaneous BID AC & HS  . isosorbide mononitrate  60 mg Oral Daily  . lisinopril  5 mg Oral Daily  . loratadine  10 mg Oral Daily  . metoprolol  25 mg Oral BID  . mupirocin ointment  1 application Nasal BID  . pantoprazole  40 mg Oral Daily  .  potassium chloride  40 mEq Oral Daily  . sodium chloride flush  3 mL Intravenous Q12H  . sodium chloride flush  3 mL Intravenous Q12H   Continuous Infusions:   Principal Problem:   Fall with possible syncope Active Problems:   Diabetic neuropathy (HCC)   NSVT (nonsustained ventricular tachycardia) (HCC)   Chest pain with moderate risk of acute coronary syndrome   Chronic indwelling Foley catheter   Chronic diastolic CHF (congestive heart failure) ([place])   Diabetes type 2, uncontrolled ([place])   History of DVT (deep vein thrombosis)   Neurogenic bladder   Hyperlipidemia LDL goal <70   Type II diabetes mellitus with peripheral circulatory disorder, uncontrolled (HCC)   Morbid obesity (HCC)   Sinus pause   CAD in native artery   Left knee pain   Inability to ambulate due to knee   Unable to walk   Chronic venous stasis dermatitis of both lower extremities    Time spent: [facility] Pager [phone]. If 7PM-7AM, please contact night-coverage at [email or web address], password [facility] 10/12/2015, 11:01 AM

## 2015-10-12 NOTE — Care Management Obs Status (Signed)
[place] NOTIFICATION   Patient Details  Name: [name] MRN: [id number] Date of Birth: [birth year]   Medicare Observation Status Notification Given:  Yes, given to pt on 10/11/15 with ABN notice.   Purcell Mouton, RN 10/12/2015, 9:07 AM

## 2015-10-13 DIAGNOSIS — I5033 Acute on chronic diastolic (congestive) heart failure: Secondary | ICD-10-CM

## 2015-10-13 DIAGNOSIS — R079 Chest pain, unspecified: Secondary | ICD-10-CM

## 2015-10-13 LAB — GLUCOSE, CAPILLARY
GLUCOSE-CAPILLARY: 193 mg/dL — AB (ref 65–99)
GLUCOSE-CAPILLARY: 159 mg/dL — AB (ref 65–99)
Glucose-Capillary: 165 mg/dL — ABNORMAL HIGH (ref 65–99)
Glucose-Capillary: 63 mg/dL — ABNORMAL LOW (ref 65–99)
Glucose-Capillary: 143 mg/dL — ABNORMAL HIGH (ref 65–99)
Glucose-Capillary: 185 mg/dL — ABNORMAL HIGH (ref 65–99)

## 2015-10-13 LAB — CBC
HEMATOCRIT: 38.1 % — AB (ref 39.0–52.0)
Hemoglobin: 12.9 g/dL — ABNORMAL LOW (ref 13.0–17.0)
MCH: 30.6 pg (ref 26.0–34.0)
MCHC: 33.9 g/dL (ref 30.0–36.0)
MCV: 90.3 fL (ref 78.0–100.0)
Platelets: 211 10*3/uL (ref 150–400)
RBC: 4.22 MIL/uL (ref 4.22–5.81)
RDW: 13.5 % (ref 11.5–15.5)
WBC: 8.9 10*3/uL (ref 4.0–10.5)

## 2015-10-13 LAB — BASIC METABOLIC PANEL
Anion gap: 10 (ref 5–15)
BUN: 9 mg/dL (ref 6–20)
CO2: 30 mmol/L (ref 22–32)
Calcium: 8.7 mg/dL — ABNORMAL LOW (ref 8.9–10.3)
Chloride: 95 mmol/L — ABNORMAL LOW (ref 101–111)
Creatinine, Ser: 0.77 mg/dL (ref 0.61–1.24)
GFR calc Af Amer: >60 mL/min (ref >60)
GFR calc non Af Amer: >60 mL/min (ref >60)
Glucose, Bld: 75 mg/dL (ref 65–99)
Potassium: 3.5 mmol/L (ref 3.5–5.1)
Sodium: 135 mmol/L (ref 135–145)

## 2015-10-13 LAB — URINE CULTURE: Culture: >=100000

## 2015-10-13 MED ORDER — OMEPRAZOLE 40 MG PO CPDR: 40.0000 mg | DELAYED_RELEASE_CAPSULE | Freq: Every day | ORAL | Status: DC

## 2015-10-13 MED ORDER — POTASSIUM CHLORIDE CRYS ER 20 MEQ PO TBCR: 40.0000 meq | EXTENDED_RELEASE_TABLET | Freq: Every day | ORAL | Status: DC

## 2015-10-13 MED ORDER — TUBERCULIN PPD 5 UNIT/0.1ML ID SOLN
5.0000 [IU] | Freq: Once | INTRADERMAL | Status: DC
  Administered 2015-10-13: 5 [IU] via INTRADERMAL
  Filled 2015-10-13: qty 0.1

## 2015-10-13 MED ORDER — FUROSEMIDE 40 MG PO TABS
40.0000 mg | ORAL_TABLET | Freq: Two times a day (BID) | ORAL | Status: DC
  Administered 2015-10-13 – 2015-10-14 (×2): 40 mg via ORAL
  Filled 2015-10-13 (×2): qty 1

## 2015-10-13 MED ORDER — FOSFOMYCIN TROMETHAMINE 3 G PO PACK
3.0000 g | PACK | Freq: Once | ORAL | Status: DC
Start: 2015-10-15 — End: 2015-11-01

## 2015-10-13 MED ORDER — ALUM & MAG HYDROXIDE-SIMETH 200-200-20 MG/5ML PO SUSP: 30.0000 mL | Freq: Four times a day (QID) | ORAL | Status: DC | PRN

## 2015-10-13 MED ORDER — FUROSEMIDE 40 MG PO TABS: 40.0000 mg | ORAL_TABLET | Freq: Two times a day (BID) | ORAL | Status: DC

## 2015-10-13 NOTE — Patient Outreach (Signed)
[facility]) Care Management  10/13/2015  [name] 1946-06-22 [id number]   After a lengthy phone conversation with patient on 10/12/2015, [name] is hopeful that patient will be agreeable to placement at [facility], only facility that made a bed offer, when medically stable and ready for discharge from hospital.  Patient was reluctant to placement at this facility, as patient was told that he would have to share a semi-private room, as there is currently not a private room available. Patient is worried about not having enough space for his own personal belongings; however, all patient really wants to bring with him is his recliner and television, which [name] explained should not be a problem in the least, as neither take up much space.  [name] reminded patient that all of his team of experts have worked very diligently to try and make these accommodations for him.  In addition, [name] explained that [name], Engineer, site at [facility] had to "pull some strings" to get him placed at her facility.  Not to mention, patient's APS (Adult Scientist, forensic) Worker with the [place], [name] was able to get patient's Long-Term Care Medicaid application processed and approved in less than 24 hours.  Patient is aware that he will not be required to sale his house; however, [name] has agreed to assist him with arranging for an agency, such as [employer] or [employer], to come into his home, clean and straighten it out so that he can prepare to put it on the market, as patient expresses an interest in selling at this point.  Patient realizes that he will no longer be able to live alone and care for himself independently, always requiring 24 hour care and supervision. [name] agreed to meet patient at [facility] to assist with completion of admissions paperwork, as well as make contact with  patient's friend, [name] to arrange for his personal belongings to be delivered to the facility.  [name] will make contact with Mrs. [name] to confirm bed availability.  [name] will contact [name], [facility] Social Worker to explain that patient is now agreeable to placement at [facility].  [name] will contact Mrs. [name] to obtain patient's Long-Term Care Medicaid number.  [name], BSW, MSW, LCSW  Licensed Education officer, [facility]  Mailing [place] [address], [place], [place] [address] Physical Address-[address], [place], [place] [address] Toll Free Main # [phone] Fax # [phone] Cell # [phone]  Fax # [phone]  [name] complies with [employer] civil rights laws and does not discriminate on the basis of race, color, national origin, age, disability, or sex.  Espaol (Spanish)  [place] cumple con las leyes federales de derechos civiles aplicables y no discrimina por motivos de raza, color, nacionalidad, edad, discapacidad o sexo.     Ti?ng Vi?t ([place])  [place] tun th? lu?t dn quy?n hi?n hnh c?a Lin bang v khng phn bi?t ?i x? d?a trn ch?ng t?c, mu da, ngu?n g?c qu?c gia, ? tu?i, khuy?t t?t, ho?c gi?i tnh.     (Arabic)    Agenda

## 2015-10-13 NOTE — Progress Notes (Signed)
[name] confirmed with Lelan Pons at [facility] at [place] that they want him to go to SNF to get stronger before they could take him. After talking with [facility] [name], [place] worker, Mechele Claude (w: [phone] c#: [phone]) patient is now agreeable with SNF.   Patient has completed Medicaid application with APS worker (Medicaid pending #: [id number]) - Medicaid worker is Robyne Peers (ph#: [phone]).   [name] received confirmation from Tammy at [facility] that they would be able to accept patient.    [name], [facility] Clinical Social Worker cell #: [phone]

## 2015-10-13 NOTE — Clinical Documentation Improvement (Signed)
Hospitalist  (Query response must be documented in the current medical record, not on the CDI BPA form.)  Cause and effect relationships must be documented by the attending provider.  Please document if there is a Suspected, Probable, or Known cause and effect relationship between the patient's chronic indwelling foley catheter and his current UTI.  Please exercise your independent, professional judgment when responding. A specific answer is not anticipated or expected.   Thank You, Erling Conte  RN BSN CCDS [phone] Health Information Management [place]

## 2015-10-13 NOTE — Progress Notes (Signed)
Physical Therapy Treatment Patient Details Name: [name] MRN: [id number] DOB: May 23, 1946 Today's Date: 10/13/2015    History of Present Illness [name] is an 70 y.o. male with a PMH of morbid obesity, hypertension, diabetic neuropathy, neurogenic bladder with chronic indwelling Foley, prior DVT who presents today after suffering from a mechanical fall at home. Although he thinks he tripped over the threshold while using his walker, he is unsure if he passed out.    PT Comments    Pt with 3 episodes of decreased alertness during session- twice in sitting and once in standing.  Pt's head slumps down and after 5-10 seconds he is able to lift head and kind of shakes it.  During episode that occurred in standing, pt did not have any LE buckeling and was able to maintain standing posture at RW despite decreased level of alertness.  Standing BP was able to be taken and was 121/90. Con't to recommend SNF as pt is high fall risk due to "blacking out" episodes and pt lives alone.  Nursing was informed of pt's mobility and episodes.  Follow Up Recommendations  SNF;Supervision/Assistance - 24 hour     Equipment Recommendations  None recommended by PT    Recommendations for Other Services       Precautions / Restrictions Precautions Precautions: Fall Precaution Comments: pt appears to have brief episodes that he states he is not aware of    Mobility  Bed Mobility Overal bed mobility: Needs Assistance Bed Mobility: Supine to Sit     Supine to sit: Supervision Sit to supine: Min guard   General bed mobility comments: Heavy use of rail. Able to get legs up for sit > supine. Pt able to pull self up using headboard. Upon sitting EOB, pt had episode for 5-10 seconds of decreased responsiveness.  After standing, pt had another episode in sitting of unresponsiveness.  Transfers Overall transfer level: Needs assistance Equipment used: Rolling walker (2 wheeled) Transfers: Sit  to/from Stand Sit to Stand: Mod assist;+2 physical assistance         General transfer comment: Stood with + 2 with NT (3rd person) present for orthostatic BP.  Pt had episode of decreased responsiveness (5-10 seconds)i n standing where head slumped down a bit, but no buckeling of legs and able to maintain posture for BP to be taken in standing.  Ambulation/Gait                 Stairs            Wheelchair Mobility    Modified Rankin (Stroke Patients Only)       Balance Overall balance assessment: History of Falls;Needs assistance   Sitting balance-Leahy Scale: Good Sitting balance - Comments: episodes of decreased alertness     Standing balance-Leahy Scale: Poor                      Cognition Arousal/Alertness: Awake/alert Behavior During Therapy: WFL for tasks assessed/performed Overall Cognitive Status: Within Functional Limits for tasks assessed           Safety/Judgement: Decreased awareness of safety          Exercises      General Comments General comments (skin integrity, edema, etc.): wrapping at R foot      Pertinent Vitals/Pain Faces Pain Scale: Hurts little more Pain Location: L shoulder and L knee Pain Intervention(s): Monitored during session;Limited activity within patient's tolerance    Home Living  Prior Function            PT Goals (current goals can now be found in the care plan section) Acute Rehab PT Goals Patient Stated Goal: to go to rehab PT Goal Formulation: With patient Time For Goal Achievement: 10/23/15 Potential to Achieve Goals: Fair Progress towards PT goals: Progressing toward goals    Frequency  Min 3X/week    PT Plan Current plan remains appropriate    Co-evaluation             End of Session Equipment Utilized During Treatment: Gait belt Activity Tolerance: Treatment limited secondary to medical complications (Comment) Patient left: in bed;with call  bell/phone within reach;with nursing/sitter in room     Time: [id number] PT Time Calculation (min) (ACUTE ONLY): 14 min  Charges:  $Therapeutic Activity: 8-22 mins                    G Codes:      [name] 10/13/2015, 2:08 PM

## 2015-10-13 NOTE — Discharge Summary (Addendum)
[facility] Discharge Summary   Patient: [name] [id number]   PCP: Irven Shelling, MD DOB: [birth year]   Date of admission: 10/08/2015   Date of discharge: 10/13/2015    Discharge Diagnoses:  Principal Problem:   Fall with possible syncope Active Problems:   Diabetic neuropathy ([place])   NSVT (nonsustained ventricular tachycardia) (HCC)   Chest pain with moderate risk of acute coronary syndrome   Chronic indwelling Foley catheter   History of DVT (deep vein thrombosis)   Pressure ulcer   Neurogenic bladder   Hyperlipidemia LDL goal <70   Type II diabetes mellitus with peripheral circulatory disorder, uncontrolled (HCC)   Morbid obesity (HCC)   Sinus pause   CAD in native artery   Left knee pain   Unable to walk   Chronic venous stasis dermatitis of both lower extremities   Acute on chronic diastolic heart failure ([place])  Recommendations for Outpatient Follow-up:  1. Follow-up with PCP in one week  2. Recommend PCP to follow up on EEG 3. Discharging at SNF  Follow-up Information    Follow up with Irven Shelling, MD In 1 week.   Specialty:  Internal Medicine   Contact information:   [address] [place] [address] [phone]      Diet recommendation: Low-salt carb modified diet with fluid restrictions of 1800  Activity: The patient is advised to gradually reintroduce usual activities.  Discharge Condition: good  History of present illness: As per the H and P dictated on admission, "[name] is an 70 y.o. male with a PMH of morbid obesity, hypertension, diabetic neuropathy, neurogenic bladder with chronic indwelling Foley, prior DVT who presents today after suffering from a mechanical fall at home. Although he thinks he tripped over the threshold while using his walker, he is unsure if he passed out. Describes having frequent spells where he loses time. He fell onto his left side and had left knee pain following the fall  with inability to ambulate. He also reported some left shoulder pain. Initially described the pain as 8/10. Also reports upper abdominal pain that was present prior to his fall and, but has worsened since the fall as well as one episode of emesis. No recent fevers. Patient has had 6 inpatient hospitalizations, many with similar issues and has been resistant to nursing home placement in the past. Is clear that he cannot adequately care for himself at home. He is largely chair bound. A radiographic evaluation was negative for any fracture. We were asked to admit the patient secondary to his current complaints of pain and his inability to ambulate and therefore care for himself in the home. Of note, the patient has been under the care of [facility]/social work at home, and [facility] [employer] were involved in attempting to get the patient placed due to his inability to adequately care for himself"  Hospital Course:  Summary of his active problems in the hospital is as following. Principal Problem:   Fall with possible syncope   NSVT (nonsustained ventricular tachycardia) ([place])  Patient presents with a mechanical fall and passing out event. Echocardiogram October 16- ejection fraction 0000000 with diastolic dysfunction Cardiac cath August 16- 60% RCA stenosis medically managed. EEG in the past has remained negative, EEG this admission is completed with the results is pending. No evidence of seizure activity in the hospital. Assistant bradycardia on telemetry, one episode of NSVT, one episode of 2 second pause which is thought to be blocked PAC. Cardiology  was consulted and recommended no further cardiac workup is needed at present. Patient did not have any further episodes of syncope here in the hospital.  Active Problems: Catheter associated UTI secondary to chronic indwelling Foley catheter. Urine is growing Staphylococcus aureus as well as Pseudomonas. As per discussion with infectious disease earlier  by my colleague Pseudomonas is more likely a colonization. He was treated with broad-spectrum antibiotics and later on was ciprofloxacin and it was discontinued. Patient will complete fosfomycin course.  Coronary artery disease with chest pain. Enzymes remains negative. Cartilages recommend continue current management.  Acute on chronic diastolic dysfunction. -28 L since admission. inaccurate ins and outs. Weight is down 24 pounds. Renal function remains stable. Cardiology recommended to discharging on 40 mg Lasix twice a day. Patient will require a repeat BMP in 1 week.  Type 2 diabetes mellitus with chronic kidney disease, neuropathy, peripheral vascular disease. Uncontrolled. Continuing home insulin on discharge.  Chronic venous stasis dermatitis. Wound care and Eucerin cream.  Left knee pain with difficulty ambulation.   Left knee pain   Unable to walk   Morbid obesity ([place])  X-ray shows osteoarthritis, continue physical therapy  Chronic indwelling catheter with neurogenic bladder. UTI associated with catheter. Patient will finish the course with fosfomycin. Continue catheter on discharge. Catheter was changed this admission.   All other chronic medical condition were stable during the hospitalization.  Patient was seen by physical therapy, who recommended SNF, SNF was arranged by social worker and case Freight [employer]. On the day of the discharge the patient's vitals remained stable, and no other acute medical condition were reported by patient. the patient was felt safe to be discharge at SNF with therapy.  Procedures and Results:  Lower extremity Doppler-negative for DVT   EEG completed- results pending  Consultations:  Cardiology  DISCHARGE MEDICATION: Current Discharge Medication List    START taking these medications   Details  alum & mag hydroxide-simeth (MAALOX/MYLANTA) 200-200-20 MG/5ML suspension Take 30 mLs by mouth every 6 (six) hours as needed for  indigestion or heartburn (dyspepsia). Qty: 355 mL, Refills: 0    fosfomycin (MONUROL) 3 g PACK Take 3 g by mouth once. Qty: 1 packet, Refills: 0    potassium chloride SA (K-DUR,KLOR-CON) 20 MEQ tablet Take 2 tablets (40 mEq total) by mouth daily. Qty: 30 tablet, Refills: 0      CONTINUE these medications which have CHANGED   Details  furosemide (LASIX) 40 MG tablet Take 1 tablet (40 mg total) by mouth 2 (two) times daily. Qty: 60 tablet, Refills: 0    omeprazole (PRILOSEC) 40 MG capsule Take 1 capsule (40 mg total) by mouth daily. Qty: 30 capsule, Refills: 0      CONTINUE these medications which have NOT CHANGED   Details  acetaminophen (TYLENOL) 325 MG tablet Take 2 tablets (650 mg total) by mouth every 4 (four) hours as needed for headache or mild pain.    aspirin EC 81 MG tablet Take 81 mg by mouth every morning.     diphenoxylate-atropine (LOMOTIL) 2.5-0.025 MG tablet Take 1 tablet by mouth 3 (three) times daily as needed for diarrhea or loose stools.  Refills: 2    fexofenadine (ALLEGRA) 180 MG tablet Take 180 mg by mouth daily as needed for allergies or rhinitis.    insulin NPH Human (HUMULIN N) 100 UNIT/ML injection Inject 0.28 mLs (28 Units total) into the skin 2 (two) times daily at 8 am and 10 pm. Qty: 10 mL, Refills: 11  isosorbide mononitrate (IMDUR) 60 MG 24 hr tablet Take 1 tablet (60 mg total) by mouth daily.    lisinopril (PRINIVIL,ZESTRIL) 5 MG tablet Take 5 mg by mouth daily. Refills: 3    metoprolol (LOPRESSOR) 50 MG tablet Take 25 mg by mouth 2 (two) times daily.     nitroGLYCERIN (NITROSTAT) 0.4 MG SL tablet Place 1 tablet (0.4 mg total) under the tongue every 5 (five) minutes as needed for chest pain. Qty: 25 tablet, Refills: 2    oxybutynin (DITROPAN) 5 MG tablet Take 5-10 mg by mouth daily as needed for bladder spasms.     senna-docusate (SENOKOT-S) 8.6-50 MG per tablet Take 2 tablets by mouth daily. Qty: 28 tablet, Refills: 0    traMADol  (ULTRAM) 50 MG tablet Take 50-100 mg by mouth 2 (two) times daily as needed for moderate pain.     magnesium oxide (MAG-OX) 400 (241.3 Mg) MG tablet Take 1 tablet (400 mg total) by mouth daily. Qty: 10 tablet, Refills: 0    mupirocin cream (BACTROBAN) 2 % Apply topically 2 (two) times daily. Qty: 15 g, Refills: 0      STOP taking these medications     ibuprofen (ADVIL,MOTRIN) 200 MG tablet      Chlorhexidine Gluconate Cloth 2 % PADS        Allergies  Allergen Reactions  . Ace Inhibitors Swelling    Pt tolerates lisinopril  . Lipitor [Atorvastatin Calcium] Swelling  . Metformin And Related Swelling  . Cefadroxil Hives  . Cephalexin Hives  . Morphine And Related Other (See Comments)    Sweating, feels like is "in rocky boat."  . Robaxin [Methocarbamol] Other (See Comments)    Feels like he is shaky   Discharge Instructions    Diet - low sodium heart healthy    Complete by:  As directed      Diet Carb Modified    Complete by:  As directed      Discharge instructions    Complete by:  As directed   It is important that you read following instructions as well as go over your medication list with RN to help you understand your care after this hospitalization.  Discharge Instructions: Please follow up with PCP in 1 week   Please request your primary care physician to go over all Hospital Tests and Procedure/Radiological results at the follow up,  Please get all Hospital records sent to your PCP by signing hospital release before you go home.   Do not drive, operating heavy machinery, perform activities at heights, swimming or participation in water activities or provide baby sitting services if your were admitted for syncope, until you have been seen by Primary Care Physician or a Neurologist and advised to do so again. Do not take more than prescribed Pain, Sleep and Anxiety Medications. You were cared for by a hospitalist during your hospital stay. If you have any questions  about your discharge medications or the care you received while you were in the hospital after you are discharged, you can call the unit and ask to speak with the hospitalist on call if the hospitalist that took care of you is not available.  Once you are discharged, your primary care physician will handle any further medical issues. Please note that NO REFILLS for any discharge medications will be authorized once you are discharged, as it is imperative that you return to your primary care physician (or establish a relationship with a primary care physician if you do  not have one) for your aftercare needs so that they can reassess your need for medications and monitor your lab values. You Must read complete instructions/literature along with all the possible adverse reactions/side effects for all the Medicines you take and that have been prescribed to you. Take any new Medicines after you have completely understood and accept all the possible adverse reactions/side effects. Wear Seat belts while driving. If you have smoked or chewed Tobacco in the last 2 yrs please stop smoking and/or stop any Recreational drug use.     Increase activity slowly    Complete by:  As directed           Discharge Exam: Filed Weights   10/11/15 0454 10/12/15 0500 10/13/15 0428  Weight: 160.709 kg (354 lb 4.8 oz) 161.072 kg (355 lb 1.6 oz) 153.5 kg (338 lb 6.5 oz)   Filed Vitals:   10/12/15 2024 10/13/15 0428  BP: 111/58 155/63  Pulse: 64 65  Temp: 98.3 F (36.8 C) 97.6 F (36.4 C)  Resp: 18 18   General: Appear in no distress, chronic lower leg eczema Oral Mucosa moist Cardiovascular: S1 and S2 Present, no Murmur, no JVD Respiratory: Bilateral Air entry present and Clear to Auscultation, non Crackles, no wheezes Abdomen: Bowel Sound present, Soft and no tenderness Extremities: Bilateral Pedal edema, no calf tenderness Neurology: Grossly no focal neuro deficit.  The results of significant diagnostics from  this hospitalization (including imaging, microbiology, ancillary and laboratory) are listed below for reference.    Significant Diagnostic Studies: Ct Head Wo Contrast  09/22/2015  CLINICAL DATA:  Status post fall. Loss balance. Occurred 2 days ago. EXAM: CT HEAD WITHOUT CONTRAST CT CERVICAL SPINE WITHOUT CONTRAST TECHNIQUE: Multidetector CT imaging of the head and cervical spine was performed following the standard protocol without intravenous contrast. Multiplanar CT image reconstructions of the cervical spine were also generated. COMPARISON:  04/14/2015 FINDINGS: CT HEAD FINDINGS There is no evidence of mass effect, midline shift, or extra-axial fluid collections. There is no evidence of a space-occupying lesion or intracranial hemorrhage. There is no evidence of a cortical-based area of acute infarction. There are calcifications in bilateral base a ganglia and thalamus. There is generalized cerebral atrophy. There is periventricular white matter low attenuation likely secondary to microangiopathy. The ventricles and sulci are appropriate for the patient's age. The basal cisterns are patent. Visualized portions of the orbits are unremarkable. The visualized portions of the paranasal sinuses and mastoid air cells are unremarkable. Cerebrovascular atherosclerotic calcifications are noted. The osseous structures are unremarkable. CT CERVICAL SPINE FINDINGS The alignment is anatomic. The vertebral body heights are maintained. There is no acute fracture. There is no static listhesis. The prevertebral soft tissues are normal. The intraspinal soft tissues are not fully imaged on this examination due to poor soft tissue contrast, but there is no gross soft tissue abnormality. The disc spaces are maintained. There are mild right uncovertebral degenerative changes at C3-4. The visualized portions of the lung apices demonstrate no focal abnormality. There is bilateral carotid artery atherosclerosis. IMPRESSION: 1. No  acute intracranial pathology. 2. No acute osseous injury of the cervical spine. Electronically Signed   By: Kathreen Devoid   On: 09/22/2015 13:14   Ct Cervical Spine Wo Contrast  09/22/2015  CLINICAL DATA:  Status post fall. Loss balance. Occurred 2 days ago. EXAM: CT HEAD WITHOUT CONTRAST CT CERVICAL SPINE WITHOUT CONTRAST TECHNIQUE: Multidetector CT imaging of the head and cervical spine was performed following the standard protocol without intravenous  contrast. Multiplanar CT image reconstructions of the cervical spine were also generated. COMPARISON:  04/14/2015 FINDINGS: CT HEAD FINDINGS There is no evidence of mass effect, midline shift, or extra-axial fluid collections. There is no evidence of a space-occupying lesion or intracranial hemorrhage. There is no evidence of a cortical-based area of acute infarction. There are calcifications in bilateral base a ganglia and thalamus. There is generalized cerebral atrophy. There is periventricular white matter low attenuation likely secondary to microangiopathy. The ventricles and sulci are appropriate for the patient's age. The basal cisterns are patent. Visualized portions of the orbits are unremarkable. The visualized portions of the paranasal sinuses and mastoid air cells are unremarkable. Cerebrovascular atherosclerotic calcifications are noted. The osseous structures are unremarkable. CT CERVICAL SPINE FINDINGS The alignment is anatomic. The vertebral body heights are maintained. There is no acute fracture. There is no static listhesis. The prevertebral soft tissues are normal. The intraspinal soft tissues are not fully imaged on this examination due to poor soft tissue contrast, but there is no gross soft tissue abnormality. The disc spaces are maintained. There are mild right uncovertebral degenerative changes at C3-4. The visualized portions of the lung apices demonstrate no focal abnormality. There is bilateral carotid artery atherosclerosis. IMPRESSION:  1. No acute intracranial pathology. 2. No acute osseous injury of the cervical spine. Electronically Signed   By: Kathreen Devoid   On: 09/22/2015 13:14   Ct Knee Left Wo Contrast  09/22/2015  CLINICAL DATA:  [place] 2 days ago.  Left knee pain. EXAM: CT OF THE LEFT KNEE WITHOUT CONTRAST TECHNIQUE: Multidetector CT imaging of the left knee was performed according to the standard protocol. Multiplanar CT image reconstructions were also generated. COMPARISON:  Radiographs 09/22/2015 FINDINGS: There are moderate to advanced tricompartmental degenerative changes most significant in the lateral compartment. There is joint space narrowing, osteophytic spurring and subchondral cystic change. No acute fracture or osteochondral lesion. There is a moderate-sized joint effusion. Moderate to advanced fatty atrophy of the lower thigh and calf musculature. IMPRESSION: 1. Tricompartmental degenerative changes. 2. No acute fracture. 3. Moderate-sized joint effusion. 4. Advanced fatty atrophy of the lower thigh and upper calf musculature. Electronically Signed   By: Marijo Sanes M.D.   On: 09/22/2015 15:46   Dg Chest Port 1 View  10/12/2015  CLINICAL DATA:  Shortness of breath.  Former smoker. EXAM: PORTABLE CHEST 1 VIEW COMPARISON:  10/08/2015 FINDINGS: The cardiac silhouette is enlarged. Mediastinal contours appear intact. There is no evidence of focal airspace consolidation, pleural effusion or pneumothorax. There is persistent pulmonary vascular congestion. Osseous structures are without acute abnormality. Soft tissues are grossly normal. IMPRESSION: Enlarged cardiac silhouette. Pulmonary vascular congestion. Electronically Signed   By: Fidela Salisbury M.D.   On: 10/12/2015 09:50   Dg Shoulder Left  10/08/2015  CLINICAL DATA:  Status post fall while going through doorway, with left shoulder pain. Initial encounter. EXAM: LEFT SHOULDER - 2+ VIEW COMPARISON:  Left shoulder radiographs performed 04/15/2015 FINDINGS: There is  no evidence of fracture or dislocation. The left humeral head is seated within the glenoid fossa. Mild degenerative change is noted at the left acromioclavicular joint. No significant soft tissue abnormalities are seen. Atelectasis is noted within the visualized portions of the lungs. IMPRESSION: No evidence of fracture or dislocation. Electronically Signed   By: Garald Balding M.D.   On: 10/08/2015 05:26   Dg Knee Complete 4 Views Left  10/08/2015  CLINICAL DATA:  Status post fall while going through doorway, with left knee  pain. Initial encounter. EXAM: LEFT KNEE - COMPLETE 4+ VIEW COMPARISON:  Left knee radiographs performed 09/23/2015 FINDINGS: There is no evidence of fracture or dislocation. Lateral compartment narrowing is noted, with marginal osteophyte formation at the lateral and patellofemoral compartments. Wall osteophytes are seen. No significant joint effusion is seen. The visualized soft tissues are normal in appearance. IMPRESSION: 1. No evidence of fracture or dislocation. 2. Mild osteoarthritis, with narrowing of the lateral compartment. Electronically Signed   By: Garald Balding M.D.   On: 10/08/2015 05:27   Dg Knee Complete 4 Views Left  09/22/2015  CLINICAL DATA:  Status post fall.  Left hip pain. EXAM: LEFT KNEE - COMPLETE 4+ VIEW COMPARISON:  None. FINDINGS: Generalized osteopenia. No fracture or dislocation. Tricompartmental osteoarthritis of the left knee most severe in the lateral femorotibial compartment. There is no aggressive lytic or sclerotic osseous lesion. IMPRESSION: No acute osseous injury of the left knee. Electronically Signed   By: Kathreen Devoid   On: 09/22/2015 13:16   Dg Knee Left Port  09/23/2015  CLINICAL DATA:  Chronic pain, progressing EXAM: PORTABLE LEFT KNEE - 1-2 VIEW COMPARISON:  Left knee radiographs and left knee CT September 22, 2015 FINDINGS: Frontal and lateral views were obtained. There is a moderate joint effusion. No fracture dislocation. There is marked  narrowing laterally with moderate patellofemoral joint space narrowing. There is spurring laterally and arising from the patellofemoral joint posteriorly and superiorly. IMPRESSION: Osteoarthritic change, most marked laterally and involving the patellofemoral joint. Joint effusion present. No acute fracture or dislocation. Electronically Signed   By: Lowella Grip III M.D.   On: 09/23/2015 21:03   Dg Abd Acute W/chest  10/08/2015  CLINICAL DATA:  Acute onset of generalized abdominal pain status post fall. Initial encounter. EXAM: DG ABDOMEN ACUTE W/ 1V CHEST COMPARISON:  CT of the chest performed 09/07/2015, and CT of the abdomen and pelvis from 05/12/2015 FINDINGS: The lungs are well-aerated. Vascular congestion is noted. There is no evidence of focal opacification, pleural effusion or pneumothorax. The cardiomediastinal silhouette is borderline normal in size. The visualized bowel gas pattern is unremarkable. Scattered stool and air are seen within the colon; there is no evidence of small bowel dilatation to suggest obstruction. No free intra-abdominal air is identified on the provided decubitus view. Postoperative change is noted at the upper abdomen, with a bowel suture line at the left upper quadrant. No acute osseous abnormalities are seen. Diffuse joint space narrowing is noted at the right hip, with associated sclerosis. IMPRESSION: 1. Unremarkable bowel gas pattern; no free intra-abdominal air seen. Moderate amount of stool noted in the colon. 2. Vascular congestion.  Lungs remain grossly clear. 3. Diffuse joint space narrowing at the right hip, with associated sclerosis. Electronically Signed   By: Garald Balding M.D.   On: 10/08/2015 05:25   Dg Hand Complete Right  09/22/2015  CLINICAL DATA:  Pain following fall 2 days prior EXAM: RIGHT HAND - COMPLETE 3+ VIEW COMPARISON:  None. FINDINGS: Frontal, oblique, and lateral views were obtained. There is no demonstrable fracture or dislocation. There  is marked narrowing of the radiocarpal joint. There is milder narrowing in the scaphotrapezial and first carpal -metacarpal joints. There is also moderate narrowing in the first MCP joint. No erosive change. There are scattered foci of arterial vascular calcification, primarily in the radial artery. IMPRESSION: Multifocal osteoarthritic change, most marked in the radiocarpal joint. Radial artery vascular calcification. No acute fracture or dislocation. Electronically Signed   By: Gwyndolyn Saxon  Jasmine December III M.D.   On: 09/22/2015 13:20   Dg Hip Unilat With Pelvis 2-3 Views Left  09/22/2015  CLINICAL DATA:  Pain following fall 2 days prior EXAM: DG HIP (WITH OR WITHOUT PELVIS) 2-3V LEFT COMPARISON:  None. FINDINGS: Frontal pelvis as well as frontal and lateral left hip images were obtained. There is no fracture or dislocation. There is advanced osteoarthritic change in the right hip joint with marked loss of joint space on the right and multiple subchondral cystic changes in the right femoral head. There is slight narrowing of the left hip joint. No erosive changes. IMPRESSION: No fracture or dislocation. Advanced osteoarthritic change right hip joint. Slight narrowing left hip joint. Electronically Signed   By: Lowella Grip III M.D.   On: 09/22/2015 13:17    Microbiology: Recent Results (from the past 240 hour(s))  MRSA PCR Screening     Status: Abnormal   Collection Time: 10/08/15  3:04 PM  Result Value Ref Range Status   MRSA by PCR POSITIVE (A) NEGATIVE Final    Comment:        The GeneXpert MRSA Assay (FDA approved for NASAL specimens only), is one component of a comprehensive MRSA colonization surveillance program. It is not intended to diagnose MRSA infection nor to guide or monitor treatment for MRSA infections. RESULT CALLED TO, READ BACK BY AND VERIFIED WITH: K.CHEEK RN AT [id number] ON 10/08/15 BY S.VANHOORNE   Culture, Urine     Status: None   Collection Time: 10/09/15  8:24 AM  Result  Value Ref Range Status   Specimen Description URINE, CATHETERIZED  Final   Special Requests NONE  Final   Culture   Final    >=100,000 COLONIES/mL PSEUDOMONAS AERUGINOSA MULTI-DRUG RESISTANT ORGANISM >=100,000 COLONIES/mL STAPHYLOCOCCUS AUREUS RESULT CALLED TO, READ BACK BY AND VERIFIED WITH: J FRENS 10/13/15 @ [facility] Performed at [facility]    Report Status 10/13/2015 FINAL  Final   Organism ID, Bacteria PSEUDOMONAS AERUGINOSA  Final   Organism ID, Bacteria STAPHYLOCOCCUS AUREUS  Final      Susceptibility   Staphylococcus aureus - MIC*    CIPROFLOXACIN >=8 RESISTANT Resistant     GENTAMICIN <=0.5 SENSITIVE Sensitive     NITROFURANTOIN <=16 SENSITIVE Sensitive     OXACILLIN >=4 RESISTANT Resistant     TETRACYCLINE <=1 SENSITIVE Sensitive     VANCOMYCIN 1 SENSITIVE Sensitive     TRIMETH/SULFA >=320 RESISTANT Resistant     CLINDAMYCIN <=0.25 SENSITIVE Sensitive     RIFAMPIN <=0.5 SENSITIVE Sensitive     Inducible Clindamycin NEGATIVE Sensitive     * >=100,000 COLONIES/mL STAPHYLOCOCCUS AUREUS   Pseudomonas aeruginosa - MIC*    CEFTAZIDIME 4 SENSITIVE Sensitive     CIPROFLOXACIN >=4 RESISTANT Resistant     GENTAMICIN >=16 RESISTANT Resistant     IMIPENEM >=16 RESISTANT Resistant     CEFEPIME 8 SENSITIVE Sensitive     * >=100,000 COLONIES/mL PSEUDOMONAS AERUGINOSA     Labs: CBC:  Recent Labs Lab 10/08/15 0549  10/09/15 0828 10/10/15 0530 10/11/15 0530 10/12/15 0536 10/13/15 0449  WBC 10.3  < > 7.4 7.1 6.5 5.9 8.9  NEUTROABS 7.0  --   --   --   --   --   --   HGB 12.6*  < > 11.2* 12.1* 11.3* 11.8* 12.9*  HCT 37.7*  < > 33.6* 36.5* 34.4* 36.1* 38.1*  MCV 91.3  < > 90.1 92.2 90.3 93.5 90.3  PLT 187  < > 184  201 186 195 211  < > = values in this interval not displayed. Basic Metabolic Panel:  Recent Labs Lab 10/09/15 0828 10/10/15 0530 10/11/15 0530 10/12/15 0536 10/13/15 0449  NA 135 138 136 136 135  K 3.7 3.4* 4.4 5.0 3.5  CL 101 100* 99* 97*  95*  CO2 27 31 30  33* 30  GLUCOSE 106* 86 119* 187* 75  BUN 10 8 7 8 9   CREATININE 0.59* 0.61 0.66 0.78 0.77  CALCIUM 7.9* 8.2* 8.4* 8.7* 8.7*  MG  --  1.5* 1.8  --   --    Liver Function Tests:  Recent Labs Lab 10/08/15 0549  AST 14*  ALT 10*  ALKPHOS 79  BILITOT 0.5  PROT 6.2*  ALBUMIN 2.9*    Recent Labs Lab 10/08/15 0549  LIPASE 20   No results for input(s): AMMONIA in the last 168 hours. Cardiac Enzymes:  Recent Labs Lab 10/08/15 0549 10/08/15 1014 10/08/15 1559 10/08/15 2221  TROPONINI <0.03 <0.03 <0.03 <0.03   BNP (last 3 results)  Recent Labs  12/22/14 2330 07/02/15 1439 09/23/15 0521  BNP 120.1* 320.3* 130.9*   CBG:  Recent Labs Lab 10/12/15 1654 10/12/15 2242 10/13/15 0446 10/13/15 0742 10/13/15 1121  GLUCAP 132* 165* 63* 143* 193*   Time spent: 30 minutes  Signed:  Divante Kotch  [facility] 10/13/2015, 2:09 PM

## 2015-10-13 NOTE — NC FL2 (Signed)
[place] LEVEL OF CARE SCREENING TOOL     IDENTIFICATION  Patient Name: BILL WIED Birthdate: [birth year] Sex: male Admission Date (Current Location): 10/08/2015  [facility] and [place] Number:  Herbalist and Address:  [facility],  [place] [address] [place]      Provider Number: [id number]  Attending Physician Name and Address:  Lavina Hamman, MD  Relative Name and Phone Number:       Current Level of Care: Hospital Recommended Level of Care: [place] Prior Approval Number:    Date Approved/Denied:   PASRR Number: [id number]  Discharge Plan: Other (Comment) (ALF)    Current Diagnoses: Patient Active Problem List   Diagnosis Date Noted  . Acute on chronic diastolic heart failure ([place]) 10/12/2015  . SOB (shortness of breath)   . Acute on chronic congestive heart failure ([place])   . Faintness   . Unable to walk 10/08/2015  . Fall with possible syncope 10/08/2015  . Chronic venous stasis dermatitis of both lower extremities 10/08/2015  . Left knee pain 09/23/2015  . Inability to ambulate due to knee 09/23/2015  . Left hip pain 09/23/2015  . CAD in native artery 09/15/2015  . PVC's (premature ventricular contractions) 09/15/2015  . Sinus pause 09/14/2015  . Hypomagnesemia 09/14/2015  . Urinary tract infection due to extended-spectrum beta lactamase (ESBL)-producing Klebsiella 09/12/2015  . Candidiasis, intertriginous 09/12/2015  . Morbid obesity ([place]) 07/03/2015  . Type II diabetes mellitus with peripheral circulatory disorder, uncontrolled ([place]) 05/09/2015  . Hyperlipidemia LDL goal <70 05/02/2015  . Abnormal nuclear stress test 05/02/2015  . Neurogenic bladder 04/26/2015  . Pressure ulcer 04/15/2015  . UTI (urinary tract infection) 04/14/2015  . History of DVT (deep vein thrombosis) 12/08/2014  . Right-sided abdominal pain of unknown cause   . Diabetes type 2, uncontrolled ([place])   . Chronic  diastolic CHF (congestive heart failure) ([place]) 04/01/2014  . Chest pain with moderate risk of acute coronary syndrome 12/23/2013  . Chronic indwelling Foley catheter 12/23/2013  . CVA (cerebral infarction) 03/11/2012  . NSVT (nonsustained ventricular tachycardia) ([place]) 10/26/2011  . Diabetic neuropathy ([place]) 10/10/2011  . OSA (obstructive sleep apnea) 10/10/2011    Orientation RESPIRATION BLADDER Height & Weight     Self, Time, Situation, Place      Weight: (!) 338 lb 6.5 oz (153.5 kg) Height:  6\' 1"  (185.4 cm)  BEHAVIORAL SYMPTOMS/MOOD NEUROLOGICAL BOWEL NUTRITION STATUS           AMBULATORY STATUS COMMUNICATION OF NEEDS Skin   Extensive Assist   PressureUlcer01/28/17StageII-Partialthicknesslossofdermispresentingasashallowopenulcerwithared,pinkwoundbedwithoutslough.quatersizecircleshapedwound   PressureUlcer12/28/16StageI-Intactskinwithnon-blanchablerednessofalocalizedareausuallyoverabonyprominence.circlepinkredcolorskinintactbutsoretotouch  Wound/Incision(OpenorDehisced)10/21/16DiabeticulcerToe(Commentwhichone)Left2nd  Wound/Incision(OpenorDehisced)10/21/16DiabeticulcerToe(Commentwhichone)RightRight2ndand3rdtoe,woundbedred   Wound/Incision(OpenorDehisced)10/21/16Other(Comment)LegAnterior;Right               Personal Care Assistance Level of Assistance  Bathing, Feeding, Dressing Bathing Assistance: Limited assistance Feeding assistance: Limited assistance Dressing Assistance: Limited assistance     Functional Limitations Info             SPECIAL CARE FACTORS FREQUENCY                       Contractures      Additional Factors Info                  Discharge Medications: DISCHARGE MEDICATION: Current Discharge Medication List    START taking these medications   Details  alum & mag hydroxide-simeth (MAALOX/MYLANTA) 200-200-20 MG/5ML  suspension Take 30 mLs by mouth every 6 (six) hours as needed  for indigestion or heartburn (dyspepsia). Qty: 355 mL, Refills: 0    fosfomycin (MONUROL) 3 g PACK Take 3 g by mouth once. Qty: 1 packet, Refills: 0    potassium chloride SA (K-DUR,KLOR-CON) 20 MEQ tablet Take 2 tablets (40 mEq total) by mouth daily. Qty: 30 tablet, Refills: 0      CONTINUE these medications which have CHANGED   Details  furosemide (LASIX) 40 MG tablet Take 1 tablet (40 mg total) by mouth 2 (two) times daily. Qty: 60 tablet, Refills: 0    omeprazole (PRILOSEC) 40 MG capsule Take 1 capsule (40 mg total) by mouth daily. Qty: 30 capsule, Refills: 0      CONTINUE these medications which have NOT CHANGED   Details  acetaminophen (TYLENOL) 325 MG tablet Take 2 tablets (650 mg total) by mouth every 4 (four) hours as needed for headache or mild pain.    aspirin EC 81 MG tablet Take 81 mg by mouth every morning.     diphenoxylate-atropine (LOMOTIL) 2.5-0.025 MG tablet Take 1 tablet by mouth 3 (three) times daily as needed for diarrhea or loose stools.  Refills: 2    fexofenadine (ALLEGRA) 180 MG tablet Take 180 mg by mouth daily as needed for allergies or rhinitis.    insulin NPH Human (HUMULIN N) 100 UNIT/ML injection Inject 0.28 mLs (28 Units total) into the skin 2 (two) times daily at 8 am and 10 pm. Qty: 10 mL, Refills: 11    isosorbide mononitrate (IMDUR) 60 MG 24 hr tablet Take 1 tablet (60 mg total) by mouth daily.    lisinopril (PRINIVIL,ZESTRIL) 5 MG tablet Take 5 mg by mouth daily. Refills: 3    metoprolol (LOPRESSOR) 50 MG tablet Take 25 mg by mouth 2 (two) times daily.     nitroGLYCERIN (NITROSTAT) 0.4 MG SL tablet Place 1 tablet (0.4 mg total) under the tongue every 5 (five) minutes as needed for chest pain. Qty: 25 tablet, Refills: 2    oxybutynin (DITROPAN) 5 MG tablet Take 5-10 mg by mouth daily as needed for bladder spasms.      senna-docusate (SENOKOT-S) 8.6-50 MG per tablet Take 2 tablets by mouth daily. Qty: 28 tablet, Refills: 0    traMADol (ULTRAM) 50 MG tablet Take 50-100 mg by mouth 2 (two) times daily as needed for moderate pain.     magnesium oxide (MAG-OX) 400 (241.3 Mg) MG tablet Take 1 tablet (400 mg total) by mouth daily. Qty: 10 tablet, Refills: 0    mupirocin cream (BACTROBAN) 2 % Apply topically 2 (two) times daily. Qty: 15 g, Refills: 0      STOP taking these medications     ibuprofen (ADVIL,MOTRIN) 200 MG tablet      Chlorhexidine Gluconate Cloth 2 % PADS            Relevant Imaging Results:  Relevant Lab Results:   Additional Information Wound type:Pressure (posterior heel), moisture Pressure Ulcer POA: Yes Measurement:Right posterior heel measures 1.4cm x 1.2cm x 0.2cm with pale pink wound bed, scant serous exudate. Bilateral inguinal and subpannicular areas with erythema and scattered satellite lesions consistent with fungal overgrowth. These are improved since last admission. No educate Wound bed:As described above. Drainage (amount, consistency, odor) As described above. Periwound:Intact and as described above. Dressing procedure/placement/frequency: I will provide Nursing with orders for twice daily application of Eucerin cream to the bilateral LEs and follow that with placement of a white petrolatum dressing to the posterior right heel pressure injury. Both feet are then  to be placed into pressure redistribution heel boots. Patient is to be turned side to side to relieve pressure on the sacrum, as noted above, he is refusing a therapeutic sleep surface.  Standley Brooking, LCSW

## 2015-10-13 NOTE — Progress Notes (Signed)
Patient Name: [name] Date of Encounter: 10/13/2015  Principal Problem:   Fall with possible syncope Active Problems:   Diabetic neuropathy ([place])   NSVT (nonsustained ventricular tachycardia) (HCC)   Chest pain with moderate risk of acute coronary syndrome   Chronic indwelling Foley catheter   Chronic diastolic CHF (congestive heart failure) ([place])   Diabetes type 2, uncontrolled ([place])   History of DVT (deep vein thrombosis)   Neurogenic bladder   Hyperlipidemia LDL goal <70   Type II diabetes mellitus with peripheral circulatory disorder, uncontrolled (HCC)   Morbid obesity (HCC)   Sinus pause   CAD in native artery   Left knee pain   Inability to ambulate due to knee   Unable to walk   Chronic venous stasis dermatitis of both lower extremities   SOB (shortness of breath)   Acute on chronic congestive heart failure (HCC)   Acute on chronic diastolic heart failure [facility])   Faintness   Primary Cardiologist Dr Marlou Porch  Patient Profile:  [name] is a 70 y.o. 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 [Seen by Cards 09/14/2015 Pauses then 2-3 seconds, symptomatology unclear, BB d/c'd and pauses improved. HR 40s while asleep, possibly 2nd OSA.]   SUBJECTIVE: Pt states he does not remember falling. Woke up on the floor. Had been up for several minutes, just turned.  Had a spell last pm, sugar was low. Does not remember any other problems. Went out for a second or so once when with PT, fell onto the bed. Not sure if that was this admission or previous. Breathing well.   OBJECTIVE Filed Vitals:   10/12/15 1114 10/12/15 1406 10/12/15 2024 10/13/15 0428  BP: 141/61 108/46 111/58 155/63  Pulse: 75 71 64 65  Temp:  98.1 F (36.7 C) 98.3 F (36.8 C) 97.6 F (36.4 C)  TempSrc:  Oral Oral Oral  Resp:  16 18 18   Height:      Weight:    338  lb 6.5 oz (153.5 kg)  SpO2:  99% 94% 99%    Intake/Output Summary (Last 24 hours) at 10/13/15 0950 Last data filed at 10/13/15 0800  Gross per 24 hour  Intake      0 ml  Output  10625 ml  Net -10625 ml   Filed Weights   10/11/15 0454 10/12/15 0500 10/13/15 0428  Weight: 354 lb 4.8 oz (160.709 kg) 355 lb 1.6 oz (161.072 kg) 338 lb 6.5 oz (153.5 kg)    PHYSICAL EXAM General: Well developed, well nourished, male in no acute distress. Head: Normocephalic, atraumatic.  Neck: Supple without bruits, JVD 8-9 cm. Lungs:  Resp regular and unlabored, scattered rales, good air exchange. Heart: RRR, S1, S2, no S3, S4, 2/6 murmur; no rub. Abdomen: Soft, non-tender, non-distended, BS + x 4.  Extremities: No clubbing, cyanosis, edema.  Neuro: Alert and oriented X 3. Moves all extremities spontaneously. Psych: Normal affect.  LABS: CBC: Recent Labs  10/12/15 0536 10/13/15 0449  WBC 5.9 8.9  HGB 11.8* 12.9*  HCT 36.1* 38.1*  MCV 93.5 90.3  PLT 195 [id number]   Basic Metabolic Panel: Recent Labs  10/11/15 0530 10/12/15 0536 10/13/15 0449  NA 136 136 135  K 4.4 5.0 3.5  CL 99* 97* 95*  CO2 30 33* 30  GLUCOSE 119* 187* 75  BUN 7 8 9  CREATININE 0.66 0.78 0.77  CALCIUM 8.4* 8.7* 8.7*  MG 1.8  --   --    BNP:  B NATRIURETIC PEPTIDE  Date/Time Value Ref Range Status  09/23/2015 05:21 AM 130.9* 0.0 - 100.0 pg/mL Final  07/02/2015 02:39 PM 320.3* 0.0 - 100.0 pg/mL Final    TELE: SR, ?Wenkebach at 5 am       Radiology/Studies: Dg Chest Port 1 View  10/12/2015  CLINICAL DATA:  Shortness of breath.  Former smoker. EXAM: PORTABLE CHEST 1 VIEW COMPARISON:  10/08/2015 FINDINGS: The cardiac silhouette is enlarged. Mediastinal contours appear intact. There is no evidence of focal airspace consolidation, pleural effusion or pneumothorax. There is persistent pulmonary vascular congestion. Osseous structures are without acute abnormality. Soft tissues are grossly normal. IMPRESSION: Enlarged  cardiac silhouette. Pulmonary vascular congestion. Electronically Signed   By: Fidela Salisbury M.D.   On: 10/12/2015 09:50     Current Medications:  . aspirin EC  81 mg Oral q morning - 10a  . enoxaparin (LOVENOX) injection  80 mg Subcutaneous Daily  . ezetimibe  10 mg Oral Daily  . [START ON 10/15/2015] fosfomycin  3 g Oral Once  . furosemide  40 mg Intravenous Q12H  . hydrocerin   Topical BID  . insulin aspart  0-20 Units Subcutaneous TID WC  . insulin aspart  0-5 Units Subcutaneous QHS  . insulin NPH Human  28 Units Subcutaneous BID AC & HS  . isosorbide mononitrate  60 mg Oral Daily  . lisinopril  5 mg Oral Daily  . loratadine  10 mg Oral Daily  . metoprolol  25 mg Oral BID  . mupirocin ointment  1 application Nasal BID  . pantoprazole  40 mg Oral Daily  . potassium chloride  40 mEq Oral Daily  . sodium chloride flush  3 mL Intravenous Q12H  . sodium chloride flush  3 mL Intravenous Q12H      ASSESSMENT AND PLAN: Syncope: No obvious cardiac cause identified, pt now says he did pass out - With normal LV function, sustained VT less likely. - No NSVT since 01/31 - Tele reviewed,has up to 2 sec pauses on occ w/ blocked PACs - no sustained bradycardia long enough to cause complete LOC.  - lowest HR are during times he is likely asleep, possible Wenkebach - HR not sustained low during the day  Brief pauses are likely from blocked PACs. Though sometimes longer pause, NONE over 3 sec.   CAD: No further management of CAD. - Ruled out for MI. - has had chest pain since admission, he has every month or so an episode. If needed he takes NTG. Does not believe he needs now. EKG stable.  - Chest pain is improved, he is on protonix and Imdur. Can Increase imdur if sx recur, no need now.    DM: ? Whether hypoglycemia could explain the syncopal spell.  - had presyncope 02/01 when CBG was 60s  Acute on chronic diastolic CHF:  - on IV Lasix - 338 lbs is lowest weight since  04/2015 - I/O -28.5 L since admit, however no PO intake is charted. - weight down 25 lbs from admit - will change to PO Lasix   Fall: Knee pain and weakness in the leg, likely contributing to the falls. - Also, BP with mild decrease with standing. - ck orthostatic VS, make sure to do at 3 min standing  Otherwise, per IM. MD advise if pt ready for d/c Principal Problem:   Fall  with possible syncope Active Problems:   Diabetic neuropathy (HCC)   NSVT (nonsustained ventricular tachycardia) (HCC)   Chest pain with moderate risk of acute coronary syndrome   Chronic indwelling Foley catheter   Chronic diastolic CHF (congestive heart failure) ([place])   Diabetes type 2, uncontrolled (HCC)   History of DVT (deep vein thrombosis)   Neurogenic bladder   Hyperlipidemia LDL goal <70   Type II diabetes mellitus with peripheral circulatory disorder, uncontrolled (HCC)   Morbid obesity (HCC)   Sinus pause   CAD in native artery   Left knee pain   Inability to ambulate due to knee   Unable to walk   Chronic venous stasis dermatitis of both lower extremities   SOB (shortness of breath)   Acute on chronic congestive heart failure (HCC)   Acute on chronic diastolic heart failure ([place])   Faintness   Signed, Barrett, Rhonda , PA-C 9:50 AM 10/13/2015  I have examined the patient and reviewed assessment and plan and discussed with patient.  Agree with above as stated.  Reports a blacking out spell when he stood with PT.  He did not fall.  No arrhythmia noted on tele.  No further cardiac w/u.  Will sign off.    Crit Obremski S.

## 2015-10-13 NOTE — Progress Notes (Signed)
Report received from Go[place], RN. No change from initial pm assessment. Will continue to monitor and follow the POC.

## 2015-10-14 DIAGNOSIS — I8311 Varicose veins of right lower extremity with inflammation: Secondary | ICD-10-CM

## 2015-10-14 DIAGNOSIS — I509 Heart failure, unspecified: Secondary | ICD-10-CM

## 2015-10-14 LAB — BASIC METABOLIC PANEL
Anion gap: 10 (ref 5–15)
BUN: 12 mg/dL (ref 6–20)
CALCIUM: 8.8 mg/dL — AB (ref 8.9–10.3)
CHLORIDE: 94 mmol/L — AB (ref 101–111)
CO2: 30 mmol/L (ref 22–32)
Creatinine, Ser: 0.82 mg/dL (ref 0.61–1.24)
GFR calc Af Amer: >60 mL/min (ref >60)
GFR calc non Af Amer: >60 mL/min (ref >60)
GLUCOSE: 141 mg/dL — AB (ref 65–99)
Potassium: 4 mmol/L (ref 3.5–5.1)
Sodium: 134 mmol/L — ABNORMAL LOW (ref 135–145)

## 2015-10-14 LAB — GLUCOSE, CAPILLARY
GLUCOSE-CAPILLARY: 108 mg/dL — AB (ref 65–99)
Glucose-Capillary: 132 mg/dL — ABNORMAL HIGH (ref 65–99)
Glucose-Capillary: 129 mg/dL — ABNORMAL HIGH (ref 65–99)

## 2015-10-14 NOTE — Progress Notes (Signed)
[place] OF CARE NOTE  Patient: [name] [id number]   PCP: Irven Shelling, MD DOB: 1946/04/29   DOA: 10/08/2015   DOS: 10/14/2015    Subjective: Patient was seen today and did not offer any acute complain.  Physical Exam: Filed Vitals:   10/13/15 2105 10/14/15 0318 10/14/15 0507 10/14/15 1054  BP: 114/54  111/56 129/57  Pulse: 63  62 67  Temp: 98.1 F (36.7 C)  98.2 F (36.8 C)   TempSrc: Oral  Oral   Resp: 18  18   Height:      Weight:  153.4 kg (338 lb 3 oz) 154.087 kg (339 lb 11.2 oz)   SpO2: 97%  98%    General: Alert, Awake and Oriented to Time, Place and Person. Appear in no distress Eyes: PERRL ENT: Oral Mucosa clear moist. Cardiovascular: S1 and S2 Present, no Murmur, Respiratory: Bilateral Air entry equal and Decreased,  Clear to Auscultation, no Crackles, no wheezes Abdomen: Bowel Sound present,   Plan of care: Patient continues to remain stable from discharge point of view. Social worker has informed me that earlier plan to discharge at the ALF has been changed to discharge at [facility]. Discharge summery updated.  Author: Berle Mull, MD [facility] Pager: [phone] 10/14/2015 2:21 PM   If 7PM-7AM, please contact night-coverage at [email or web address], password [facility]

## 2015-10-14 NOTE — Clinical Social Work Placement (Signed)
Patient is set to discharge to [facility] today. Patient aware. Discharge packet given to RN, Raquel Sarna. [facility] called for transport to pickup at 2:00pm.     [name], [place] Social Worker cell #: [phone]    CLINICAL SOCIAL WORK PLACEMENT  NOTE  Date:  10/14/2015  Patient Details  Name: [name] MRN: [id number] Date of Birth: 05/02/1946  Clinical Social Work is seeking post-discharge placement for this patient at the [place] level of care (*[name] will initial, date and re-position this form in  chart as items are completed):  Yes   Patient/family provided with [place] Work Department's list of facilities offering this level of care within the geographic area requested by the patient (or if unable, by the patient's family).  Yes   Patient/family informed of their freedom to choose among providers that offer the needed level of care, that participate in Medicare, Medicaid or managed care program needed by the patient, have an available bed and are willing to accept the patient.  Yes   Patient/family informed of [place]'s ownership interest in [facility] and [facility], as well as of the fact that they are under no obligation to receive care at these facilities.  PASRR submitted to EDS on 10/10/15     PASRR number received on 10/10/15     Existing PASRR number confirmed on       FL2 transmitted to all facilities in geographic area requested by pt/family on 10/10/15     FL2 transmitted to all facilities within larger geographic area on       Patient informed that his/her managed care company has contracts with or will negotiate with certain facilities, including the following:        Yes   Patient/family informed of bed offers received.  Patient chooses bed at [facility]     Physician recommends and patient chooses bed at      Patient to be  transferred to [facility] on 10/14/15.  Patient to be transferred to facility by [facility]     Patient family notified on 10/14/15 of transfer.  Name of family member notified:        PHYSICIAN       Additional Comment:    _______________________________________________ Standley Brooking, LCSW 10/14/2015, 12:09 PM

## 2015-10-14 NOTE — Progress Notes (Signed)
Attempted to call report to [facility] rehab, No one available to take report.

## 2015-10-17 ENCOUNTER — Encounter: Payer: Self-pay | Admitting: Internal Medicine

## 2015-10-17 ENCOUNTER — Non-Acute Institutional Stay (SKILLED_NURSING_FACILITY): Payer: Medicare Other | Admitting: Internal Medicine

## 2015-10-17 DIAGNOSIS — I872 Venous insufficiency (chronic) (peripheral): Secondary | ICD-10-CM

## 2015-10-17 DIAGNOSIS — I8311 Varicose veins of right lower extremity with inflammation: Secondary | ICD-10-CM

## 2015-10-17 DIAGNOSIS — T83511D Infection and inflammatory reaction due to indwelling urethral catheter, subsequent encounter: Secondary | ICD-10-CM

## 2015-10-17 DIAGNOSIS — I5033 Acute on chronic diastolic (congestive) heart failure: Secondary | ICD-10-CM

## 2015-10-17 DIAGNOSIS — I4729 Other ventricular tachycardia: Secondary | ICD-10-CM

## 2015-10-17 DIAGNOSIS — E1165 Type 2 diabetes mellitus with hyperglycemia: Secondary | ICD-10-CM

## 2015-10-17 DIAGNOSIS — M25512 Pain in left shoulder: Secondary | ICD-10-CM | POA: Diagnosis not present

## 2015-10-17 DIAGNOSIS — I472 Ventricular tachycardia: Secondary | ICD-10-CM | POA: Diagnosis not present

## 2015-10-17 DIAGNOSIS — I2510 Atherosclerotic heart disease of native coronary artery without angina pectoris: Secondary | ICD-10-CM

## 2015-10-17 DIAGNOSIS — N390 Urinary tract infection, site not specified: Secondary | ICD-10-CM

## 2015-10-17 DIAGNOSIS — R55 Syncope and collapse: Secondary | ICD-10-CM | POA: Diagnosis not present

## 2015-10-17 DIAGNOSIS — N319 Neuromuscular dysfunction of bladder, unspecified: Secondary | ICD-10-CM

## 2015-10-17 DIAGNOSIS — Z794 Long term (current) use of insulin: Secondary | ICD-10-CM

## 2015-10-17 DIAGNOSIS — E1159 Type 2 diabetes mellitus with other circulatory complications: Secondary | ICD-10-CM

## 2015-10-17 DIAGNOSIS — K219 Gastro-esophageal reflux disease without esophagitis: Secondary | ICD-10-CM | POA: Diagnosis not present

## 2015-10-17 DIAGNOSIS — W19XXXD Unspecified fall, subsequent encounter: Secondary | ICD-10-CM

## 2015-10-17 DIAGNOSIS — R262 Difficulty in walking, not elsewhere classified: Secondary | ICD-10-CM | POA: Diagnosis not present

## 2015-10-17 DIAGNOSIS — M6281 Muscle weakness (generalized): Secondary | ICD-10-CM | POA: Diagnosis not present

## 2015-10-17 DIAGNOSIS — IMO0002 Reserved for concepts with insufficient information to code with codable children: Secondary | ICD-10-CM

## 2015-10-17 NOTE — Progress Notes (Signed)
MRN: [id number] Name: [name]  Sex: male Age: 70 y.o. DOB: [birth year]  [place] #: Karren Burly Facility/Room:125 Level Of Care: SNF Provider: Inocencio Homes D Emergency Contacts: Extended Emergency Contact Information Primary Emergency Contact: Whiting,[name] Address: [address]          [place], [place] [address] [name] of [place] Phone: [phone] Relation: Friend Secondary Emergency Contact: [name] [place] of [place] Phone: [phone] Relation: None  Code Status:   Allergies: Ace inhibitors; Lipitor; Metformin and related; Cefadroxil; Cephalexin; Morphine and related; and Robaxin  Chief Complaint  Patient presents with  . New Admit To SNF    HPI: Patient is 70 y.o. male with PMH of morbid obesity, hypertension, diabetic neuropathy, neurogenic bladder with chronic indwelling Foley, prior DVT who presents today after suffering from a mechanical fall at home. Although he thinks he tripped over the threshold while using his walker, he is unsure if he passed out. Describes having frequent spells where he loses time. He fell onto his left side and had left knee pain following the fall with inability to ambulate. He also reported some left shoulder pain. Initially described the pain as 8/10. Also reports upper abdominal pain that was present prior to his fall and, but has worsened since the fall as well as one episode of emesis. No recent fevers. Patient has had 6 inpatient hospitalizations, many with similar issues and has been resistant to nursing home placement in the past. Is clear that he cannot adequately care for himself at home. He is largely chair bound. A radiographic evaluation was negative for any fracture. The patient was admitted to [facility] form 1/28-2/2 secondary to his current complaints of pain and his inability to ambulate and therefore care for himself in the home.( Of note, the patient has been under the care of [facility]/social work at home, and  [employer] [employer] were involved in attempting to get the patient placed due to his inability to adequately care for himself ). While at hospital pt was evaluated for fall and treated for acute UTI and acute on chronic diastolic CHF. Pt is admitted to SNF with generalized weakness for residential care and OT/PT. While at SNF pt will be followed for CAD, tx with ASA, IMdur and metoprolol, neurogenic bladder, tx with catheter and ditropan and GERD, tx with protonix.   Past Medical History  Diagnosis Date  . Obese   . Hypertension   . Diabetic neuropathy, painful ([place])   . Chronic indwelling Foley catheter   . Bladder spasms   . Hyperlipidemia   . GERD (gastroesophageal reflux disease)   . Neurogenic bladder   . CAD (coronary artery disease)   . PONV (postoperative nausea and vomiting)   . CHF (congestive heart failure) ([place])   . DVT (deep venous thrombosis) (HCC) 1980's    LLE  . Type II diabetes mellitus ([place])   . Diabetic diarrhea ([place])   . Pneumonia 1999  . OSA (obstructive sleep apnea)     "they wanted me to wear a mask; I couldn't" (11/10/2014)  . Arthritis     "hands, ankles, knees" (11/10/2014)  . Chronic lower back pain   . Stroke [facility]) 10/2011; 06/2014    right hand numbness/notes 10/20/2011, pt not sure he had stroke in 2013; "right hand weaker and right face not quite right since" (11/10/2014)    Past Surgical History  Procedure Laterality Date  . Gastroplasty    . Total knee arthroplasty Right 1990's  . Left  heart catheterization with coronary angiogram N/A 10/24/2011    Procedure: LEFT HEART CATHETERIZATION WITH CORONARY ANGIOGRAM;  Surgeon: Candee Furbish, MD;  Location: [facility] CATH LAB;  Service: Cardiovascular;  Laterality: N/A;  right radial artery approach  . Cholecystectomy open  1970's?  . Joint replacement    . Carpal tunnel release Bilateral ~ 2003-2004  . Cardiac catheterization N/A 05/02/2015    Procedure: Left Heart Cath and Coronary Angiography;  Surgeon: Lorretta Harp, MD;  Location: [facility] CV LAB;  Service: Cardiovascular;  Laterality: N/A;  . Cardiac catheterization N/A 05/04/2015    Procedure: Left Heart Cath and Coronary Angiography;  Surgeon: Wellington Hampshire, MD;  Location: [facility] CV LAB;  Service: Cardiovascular;  Laterality: N/A;      Medication List       This list is accurate as of: 10/17/15 11:59 PM.  Always use your most recent med list.               acetaminophen 325 MG tablet  Commonly known as:  TYLENOL  Take 2 tablets (650 mg total) by mouth every 4 (four) hours as needed for headache or mild pain.     alum & mag hydroxide-simeth 200-200-20 MG/5ML suspension  Commonly known as:  MAALOX/MYLANTA  Take 30 mLs by mouth every 6 (six) hours as needed for indigestion or heartburn (dyspepsia).     aspirin EC 81 MG tablet  Take 81 mg by mouth every morning.     diphenoxylate-atropine 2.5-0.025 MG tablet  Commonly known as:  LOMOTIL  Take 1 tablet by mouth 3 (three) times daily as needed for diarrhea or loose stools.     fexofenadine 180 MG tablet  Commonly known as:  ALLEGRA  Take 180 mg by mouth daily as needed for allergies or rhinitis.     fosfomycin 3 g Pack  Commonly known as:  MONUROL  Take 3 g by mouth once.     furosemide 40 MG tablet  Commonly known as:  LASIX  Take 1 tablet (40 mg total) by mouth 2 (two) times daily.     insulin NPH Human 100 UNIT/ML injection  Commonly known as:  HUMULIN N  Inject 0.28 mLs (28 Units total) into the skin 2 (two) times daily at 8 am and 10 pm.     isosorbide mononitrate 60 MG 24 hr tablet  Commonly known as:  IMDUR  Take 1 tablet (60 mg total) by mouth daily.     lisinopril 5 MG tablet  Commonly known as:  PRINIVIL,ZESTRIL  Take 5 mg by mouth daily.     magnesium oxide 400 (241.3 Mg) MG tablet  Commonly known as:  MAG-OX  Take 1 tablet (400 mg total) by mouth daily.     metoprolol 50 MG tablet  Commonly known as:  LOPRESSOR  Take 25 mg by mouth 2 (two)  times daily.     mupirocin cream 2 %  Commonly known as:  BACTROBAN  Apply topically 2 (two) times daily.     nitroGLYCERIN 0.4 MG SL tablet  Commonly known as:  NITROSTAT  Place 1 tablet (0.4 mg total) under the tongue every 5 (five) minutes as needed for chest pain.     omeprazole 40 MG capsule  Commonly known as:  PRILOSEC  Take 1 capsule (40 mg total) by mouth daily.     oxybutynin 5 MG tablet  Commonly known as:  DITROPAN  Take 5-10 mg by mouth daily as needed for bladder spasms.  potassium chloride SA 20 MEQ tablet  Commonly known as:  K-DUR,KLOR-CON  Take 2 tablets (40 mEq total) by mouth daily.     senna-docusate 8.6-50 MG tablet  Commonly known as:  Senokot-S  Take 2 tablets by mouth daily.     traMADol 50 MG tablet  Commonly known as:  ULTRAM  Take 50-100 mg by mouth 2 (two) times daily as needed for moderate pain.        No orders of the defined types were placed in this encounter.    Immunization History  Administered Date(s) Administered  . Influenza Split 10/12/2011, 10/13/2011  . Influenza,inj,Quad PF,36+ Mos 05/06/2015  . Influenza-Unspecified 06/10/2014  . PPD Test 10/13/2015    Social History  Substance Use Topics  . Smoking status: Former Smoker -- 2.00 packs/day for 20 years    Types: Cigarettes    Quit date: 09/10/1976  . Smokeless tobacco: Never Used  . Alcohol Use: No     Comment: FORMER ALCOHOLIC; "sober since [id number]"    Family history is + HTN, CA  Review of Systems  DATA OBTAINED: from patient, nurse, medical record, family member GENERAL:  no fevers, fatigue, appetite changes SKIN: No itching, rash or wounds EYES: No eye pain, redness, discharge EARS: No earache, tinnitus, change in hearing NOSE: No congestion, drainage or bleeding  MOUTH/THROAT: No mouth or tooth pain, No sore throat RESPIRATORY: No cough, wheezing, SOB CARDIAC: No chest pain, palpitations, lower extremity edema  GI: No abdominal pain, No N/V/D or  constipation, No heartburn or reflux  GU: No dysuria, frequency or urgency, or incontinence  MUSCULOSKELETAL: No unrelieved bone/joint pain NEUROLOGIC: No headache, dizziness or focal weakness PSYCHIATRIC: No c/o anxiety or sadness   Filed Vitals:   10/22/15 1332  BP: 136/66  Pulse: 70  Temp: 98 F (36.7 C)  Resp: 18    SpO2 Readings from Last 1 Encounters:  10/14/15 98%        Physical Exam  GENERAL APPEARANCE: Alert, conversant,  No acute distress.  SKIN: No diaphoresis rash; open area R leg dressed HEAD: Normocephalic, atraumatic  EYES: Conjunctiva/lids clear. Pupils round, reactive. EOMs intact.  EARS: External exam WNL, canals clear. Hearing grossly normal.  NOSE: No deformity or discharge.  MOUTH/THROAT: Lips w/o lesions  RESPIRATORY: Breathing is even, unlabored. Lung sounds are clear   CARDIOVASCULAR: Heart RRR 3/6 systolic M, norubs or gallops. 1+ peripheral edema.   GASTROINTESTINAL: Abdomen is soft, non-tender, not distended w/ normal bowel sounds. GENITOURINARY: Bladder non tender, not distended  MUSCULOSKELETAL: No abnormal joints or musculature NEUROLOGIC:  Cranial nerves 2-12 grossly intact. Moves all extremities  PSYCHIATRIC: Mood and affect appropriate to situation, no behavioral issues  Patient Active Problem List   Diagnosis Date Noted  . GERD (gastroesophageal reflux disease) 10/22/2015  . Acute CHF (congestive heart failure) ([place]) 10/14/2015  . Acute on chronic diastolic heart failure ([place]) 10/12/2015  . SOB (shortness of breath)   . Acute on chronic congestive heart failure ([place])   . Faintness   . Unable to walk 10/08/2015  . Fall with possible syncope 10/08/2015  . Chronic venous stasis dermatitis of both lower extremities 10/08/2015  . Left knee pain 09/23/2015  . Inability to ambulate due to knee 09/23/2015  . Left hip pain 09/23/2015  . CAD in native artery 09/15/2015  . PVC's (premature ventricular contractions) 09/15/2015  . Sinus  pause 09/14/2015  . Hypomagnesemia 09/14/2015  . Urinary tract infection due to extended-spectrum beta lactamase (ESBL)-producing Klebsiella 09/12/2015  .  Candidiasis, intertriginous 09/12/2015  . Morbid obesity ([place]) 07/03/2015  . Type II diabetes mellitus with peripheral circulatory disorder, uncontrolled ([place]) 05/09/2015  . Hyperlipidemia LDL goal <70 05/02/2015  . Abnormal nuclear stress test 05/02/2015  . Neurogenic bladder 04/26/2015  . Pressure ulcer 04/15/2015  . UTI (urinary tract infection) due to urinary indwelling catheter ([place]) 04/14/2015  . History of DVT (deep vein thrombosis) 12/08/2014  . Right-sided abdominal pain of unknown cause   . Diabetes type 2, uncontrolled ([place])   . Chronic diastolic CHF (congestive heart failure) ([place]) 04/01/2014  . Chest pain with moderate risk of acute coronary syndrome 12/23/2013  . Chronic indwelling Foley catheter 12/23/2013  . CVA (cerebral infarction) 03/11/2012  . NSVT (nonsustained ventricular tachycardia) ([place]) 10/26/2011  . Diabetic neuropathy ([place]) 10/10/2011  . OSA (obstructive sleep apnea) 10/10/2011    CBC    Component Value Date/Time   WBC 8.9 10/13/2015 0449   RBC 4.22 10/13/2015 0449   HGB 12.9* 10/13/2015 0449   HCT 38.1* 10/13/2015 0449   PLT 211 10/13/2015 0449   MCV 90.3 10/13/2015 0449   LYMPHSABS 1.6 10/08/2015 0549   MONOABS 0.8 10/08/2015 0549   EOSABS 0.8* 10/08/2015 0549   BASOSABS 0.1 10/08/2015 0549    CMP     Component Value Date/Time   NA 134* 10/14/2015 0504   K 4.0 10/14/2015 0504   CL 94* 10/14/2015 0504   CO2 30 10/14/2015 0504   GLUCOSE 141* 10/14/2015 0504   BUN 12 10/14/2015 0504   CREATININE 0.82 10/14/2015 0504   CREATININE 0.81 09/20/2014 1537   CALCIUM 8.8* 10/14/2015 0504   PROT 6.2* 10/08/2015 0549   ALBUMIN 2.9* 10/08/2015 0549   AST 14* 10/08/2015 0549   ALT 10* 10/08/2015 0549   ALKPHOS 79 10/08/2015 0549   BILITOT 0.5 10/08/2015 0549   GFRNONAA >60 10/14/2015 0504    GFRAA >60 10/14/2015 0504    Lab Results  Component Value Date   HGBA1C 10.4* 09/10/2015     No results found.  Not all labs, radiology exams or other studies done during hospitalization come through on my EPIC note; however they are reviewed by me.    Assessment and Plan  Fall with possible syncope Patient presents with a mechanical fall and passing out event. Echocardiogram October 16- ejection fraction 0000000 with diastolic dysfunction Cardiac cath August 16- 60% RCA stenosis medically managed. EEG in the past has remained negative, EEG this admission is completed with the results is pending. No evidence of seizure activity in the hospital. Assistant bradycardia on telemetry, one episode of NSVT, one episode of 2 second pause which is thought to be blocked PAC. Cardiology was consulted and recommended no further cardiac workup is needed at present. Patient did not have any further episodes of syncope here in the hospital.   NSVT (nonsustained ventricular tachycardia) (HCC) Echocardiogram October 16- ejection fraction 0000000 with diastolic dysfunction Cardiac cath August 16- 60% RCA stenosis medically managed. Assistant bradycardia on telemetry, one episode of NSVT, one episode of 2 second pause which is thought to be blocked PAC. Cardiology was consulted and recommended no further cardiac workup is needed at present. Patient did not have any further episodes of syncope here in the hospital.   UTI (urinary tract infection) due to urinary indwelling catheter ([place]) Urine is growing Staphylococcus aureus as well as Pseudomonas. As per discussion with infectious disease earlier by my colleague Pseudomonas is more likely a colonization. He was treated with broad-spectrum antibiotics and later on was  ciprofloxacin and it was discontinued. Patient will complete fosfomycin course. SNF - one more dose fosfomycin at SNF  Acute on chronic diastolic heart failure ([place]) -28 L since  admission. inaccurate ins and outs. Weight is down 24 pounds. Renal function remains stable. Cardiology recommended to discharging on 40 mg Lasix twice a day. SNF -  repeat BMP in 1 week.   Neurogenic bladder SNF - cont chronic foley; cont ditropan  Chronic venous stasis dermatitis of both lower extremities SNF - Wound care and Eucerin cream.  Diabetes type 2, uncontrolled (HCC) SNF - a1c 10.4; cont insulin ;follow ac and qHS  Unable to walk SNF 2/2 OA and morbid obesity; OT/PT  Morbid obesity (HCC) SNF - calorie appropriate diet and OT/PT  GERD (gastroesophageal reflux disease) SNF -  ot stated as unconrolled; cont prilosec 40 mg daily  CAD in native artery SNF- - ENZYMES NEG;CONT ASA, IMDUR AND METOPROLOL   Time spent . 45 min;> 50% of time with patient was spent reviewing records, labs, tests and studies, counseling and developing plan of care  Hennie Duos, MD

## 2015-10-18 DIAGNOSIS — M25512 Pain in left shoulder: Secondary | ICD-10-CM | POA: Diagnosis not present

## 2015-10-18 DIAGNOSIS — M6281 Muscle weakness (generalized): Secondary | ICD-10-CM | POA: Diagnosis not present

## 2015-10-18 DIAGNOSIS — R55 Syncope and collapse: Secondary | ICD-10-CM | POA: Diagnosis not present

## 2015-10-18 NOTE — Procedures (Signed)
HPI:  70 y.o. male with a PMH of morbid obesity, hypertension, diabetic neuropathy, neurogenic bladder with chronic indwelling Foley, prior DVT who presents today after suffering from a mechanical fall at home and possible syncope.  EEG is late entry as was placed in wrong provider inbox for reading.  TECHNICAL SUMMARY:  A multichannel referential and bipolar montage EEG using the standard international 10-20 system was performed on the patient described as awake, drowsy and asleep.  The dominant background activity consists of 9-10 hertz activity seen most prominantly over the posterior head region.  The backgound activity is reactive to eye opening and closing procedures.  Low voltage fast (beta) activity is distributed symmetrically and maximally over the anterior head regions.  ACTIVATION:  Photic stimulation and hyperventilation were not performed  EPILEPTIFORM ACTIVITY:  There were no spikes, sharp waves or paroxysmal activity.  SLEEP:  Physiologic drowsiness and stage II sleep were noted    IMPRESSION:  This is a normal EEG for the patients stated age.  There were no focal, hemispheric or lateralizing features.  No epileptiform activity was recorded.  A normal EEG does not exclude the diagnosis of a seizure disorder and if seizure remains high on the list of differential diagnosis, an ambulatory EEG may be of value.  Clinical correlation is required.

## 2015-10-19 ENCOUNTER — Other Ambulatory Visit: Payer: Self-pay | Admitting: *Deleted

## 2015-10-19 DIAGNOSIS — M25512 Pain in left shoulder: Secondary | ICD-10-CM | POA: Diagnosis not present

## 2015-10-19 DIAGNOSIS — M6281 Muscle weakness (generalized): Secondary | ICD-10-CM | POA: Diagnosis not present

## 2015-10-19 DIAGNOSIS — R55 Syncope and collapse: Secondary | ICD-10-CM | POA: Diagnosis not present

## 2015-10-19 NOTE — Patient Outreach (Signed)
[facility]) Care Management  10/19/2015  [name] August 11, 1946 [id number]   [name] made an attempt to try and contact patient today to follow-up with patient after recent placement at skilled nursing facility; however, patient was unavailable. A HIPAA compliant message was left on voicemail.  [name] is currently awaiting a return call.  [name], BSW, MSW, LCSW  Licensed Education officer, environmental Health System  Mailing [place] [address], [place], [place] [address] Physical Address-[address], [place], [place] [address] Toll Free Main # [phone] Fax # [phone] Cell # [phone]  Fax # [phone]  [name]    [facility] complies with [employer] civil rights laws and does not discriminate on the basis of race, color, national origin, age, disability, or sex.  Espaol (Spanish)  [place] cumple con las leyes federales de derechos civiles aplicables y no discrimina por motivos de raza, color, nacionalidad, edad, discapacidad o sexo.     Ti[place] Vi[place]t ([place])  [place] tun th? lu?t dn quy?n hi?n hnh c?a Lin bang v khng phn bi?t ?i x? d?a trn ch?ng t?c, mu da, ngu?n g?c qu?c gia, ? tu?i, khuy?t t?t, ho?c gi?i tnh.     (Arabic)    [place]

## 2015-10-20 ENCOUNTER — Other Ambulatory Visit: Payer: Self-pay | Admitting: *Deleted

## 2015-10-20 ENCOUNTER — Non-Acute Institutional Stay (SKILLED_NURSING_FACILITY): Payer: Medicare Other | Admitting: Internal Medicine

## 2015-10-20 DIAGNOSIS — M25512 Pain in left shoulder: Secondary | ICD-10-CM

## 2015-10-20 DIAGNOSIS — M6281 Muscle weakness (generalized): Secondary | ICD-10-CM | POA: Diagnosis not present

## 2015-10-20 DIAGNOSIS — R404 Transient alteration of awareness: Secondary | ICD-10-CM

## 2015-10-20 DIAGNOSIS — G2581 Restless legs syndrome: Secondary | ICD-10-CM

## 2015-10-20 DIAGNOSIS — R55 Syncope and collapse: Secondary | ICD-10-CM | POA: Diagnosis not present

## 2015-10-20 DIAGNOSIS — R4020 Unspecified coma: Secondary | ICD-10-CM

## 2015-10-20 NOTE — Patient Outreach (Signed)
[facility]) Care Management  10/20/2015  [name] Apr 15, 1946 [id number]   [name] made a second attempt to try and contact patient today to follow-up with patient after recent placement at skilled nursing facility; however, patient was unavailable. A HIPAA compliant message was left on voicemail. [name] is currently awaiting a return call.  [name], BSW, MSW, LCSW  Licensed Education officer, environmental Health System  Mailing [place] [address] [place], [place] [address] Physical Address-[address], [place], [place] [address] Toll Free Main # [phone] Fax # [phone] Cell # [phone]  Fax # [phone]  [name]    [place] complies with [employer] civil rights laws and does not discriminate on the basis of race, color, national origin, age, disability, or sex.  Espaol (Spanish)  [place] cumple con las leyes federales de derechos civiles aplicables y no discrimina por motivos de raza, color, nacionalidad, edad, discapacidad o sexo.     [place]  [place] tun th? lu?t dn quy?n hi?n hnh c?a Lin bang v khng phn bi?t ?i x? d?a trn ch?ng t?c, mu da, ngu?n g?c qu?c gia, ? tu?i, khuy?t t?t, ho?c gi?i tnh.     (Arabic)    [place]

## 2015-10-20 NOTE — Progress Notes (Signed)
MRN: [id number] Name: [name]  Sex: male Age: 70 y.o. DOB: 1946/08/04  Mora #: Karren Burly Facility/Room:131 Level Of Care: SNF Provider: Inocencio Homes D Emergency Contacts: Extended Emergency Contact Information Primary Emergency Contact: Whiting,[name] Address: [address]          [place], [place] [address] [name] of [place] Phone: [phone] Relation: Friend Secondary Emergency Contact: [name] [place] of [place] Phone: [phone] Relation: None  Code Status:   Allergies: Ace inhibitors; Lipitor; Metformin and related; Cefadroxil; Cephalexin; Morphine and related; and Robaxin  Chief Complaint  Patient presents with  . Medical Management of Chronic Issues    HPI: Patient is 70 y.o. male who is being seen at request of PT. They have witnesses 4 episodes of  pt losing consciousness, 3 while sitting and 1 while standing. The pt has  o warning symptoms of faintness or light headedness. These last 5-10 seconds and when pt wakes up he doesn't know what had happened. No loss of bowel or bladder, no post ictal period. No c/o or sign of CP, SOB or other. Pt is concerned to be working with him for safety reasons if nothing else. These episodes have occurred over several days. Nothing makes better or worse.  Past Medical History  Diagnosis Date  . Obese   . Hypertension   . Diabetic neuropathy, painful ([place])   . Chronic indwelling Foley catheter   . Bladder spasms   . Hyperlipidemia   . GERD (gastroesophageal reflux disease)   . Neurogenic bladder   . CAD (coronary artery disease)   . PONV (postoperative nausea and vomiting)   . CHF (congestive heart failure) ([place])   . DVT (deep venous thrombosis) (H[facility]) 1980's    LLE  . Type II diabetes mellitus ([place])   . Diabetic diarrhea ([place])   . Pneumonia 1999  . OSA (obstructive sleep apnea)     "they wanted me to wear a mask; I couldn't" (11/10/2014)  . Arthritis     "hands, ankles, knees"  (11/10/2014)  . Chronic lower back pain   . Stroke [facility]) 10/2011; 06/2014    right hand numbness/notes 10/20/2011, pt not sure he had stroke in 2013; "right hand weaker and right face not quite right since" (11/10/2014)    Past Surgical History  Procedure Laterality Date  . Gastroplasty    . Total knee arthroplasty Right 1990's  . Left heart catheterization with coronary angiogram N/A 10/24/2011    Procedure: LEFT HEART CATHETERIZATION WITH CORONARY ANGIOGRAM;  Surgeon: Candee Furbish, MD;  Location: [facility] CATH LAB;  Service: Cardiovascular;  Laterality: N/A;  right radial artery approach  . Cholecystectomy open  1970's?  . Joint replacement    . Carpal tunnel release Bilateral ~ 2003-2004  . Cardiac catheterization N/A 05/02/2015    Procedure: Left Heart Cath and Coronary Angiography;  Surgeon: Lorretta Harp, MD;  Location: [facility] CV LAB;  Service: Cardiovascular;  Laterality: N/A;  . Cardiac catheterization N/A 05/04/2015    Procedure: Left Heart Cath and Coronary Angiography;  Surgeon: Wellington Hampshire, MD;  Location: [facility] CV LAB;  Service: Cardiovascular;  Laterality: N/A;      Medication List       This list is accurate as of: 10/20/15 11:59 PM.  Always use your most recent med list.               acetaminophen 325 MG tablet  Commonly known as:  TYLENOL  Take 2 tablets (650 mg  total) by mouth every 4 (four) hours as needed for headache or mild pain.     alum & mag hydroxide-simeth 200-200-20 MG/5ML suspension  Commonly known as:  MAALOX/MYLANTA  Take 30 mLs by mouth every 6 (six) hours as needed for indigestion or heartburn (dyspepsia).     aspirin EC 81 MG tablet  Take 81 mg by mouth every morning.     diphenoxylate-atropine 2.5-0.025 MG tablet  Commonly known as:  LOMOTIL  Take 1 tablet by mouth 3 (three) times daily as needed for diarrhea or loose stools.     fexofenadine 180 MG tablet  Commonly known as:  ALLEGRA  Take 180 mg by mouth daily as needed for  allergies or rhinitis.     fosfomycin 3 g Pack  Commonly known as:  MONUROL  Take 3 g by mouth once.     furosemide 40 MG tablet  Commonly known as:  LASIX  Take 1 tablet (40 mg total) by mouth 2 (two) times daily.     insulin NPH Human 100 UNIT/ML injection  Commonly known as:  HUMULIN N  Inject 0.28 mLs (28 Units total) into the skin 2 (two) times daily at 8 am and 10 pm.     isosorbide mononitrate 60 MG 24 hr tablet  Commonly known as:  IMDUR  Take 1 tablet (60 mg total) by mouth daily.     lisinopril 5 MG tablet  Commonly known as:  PRINIVIL,ZESTRIL  Take 5 mg by mouth daily.     magnesium oxide 400 (241.3 Mg) MG tablet  Commonly known as:  MAG-OX  Take 1 tablet (400 mg total) by mouth daily.     metoprolol 50 MG tablet  Commonly known as:  LOPRESSOR  Take 25 mg by mouth 2 (two) times daily.     mupirocin cream 2 %  Commonly known as:  BACTROBAN  Apply topically 2 (two) times daily.     nitroGLYCERIN 0.4 MG SL tablet  Commonly known as:  NITROSTAT  Place 1 tablet (0.4 mg total) under the tongue every 5 (five) minutes as needed for chest pain.     omeprazole 40 MG capsule  Commonly known as:  PRILOSEC  Take 1 capsule (40 mg total) by mouth daily.     oxybutynin 5 MG tablet  Commonly known as:  DITROPAN  Take 5-10 mg by mouth daily as needed for bladder spasms.     potassium chloride SA 20 MEQ tablet  Commonly known as:  K-DUR,KLOR-CON  Take 2 tablets (40 mEq total) by mouth daily.     senna-docusate 8.6-50 MG tablet  Commonly known as:  Senokot-S  Take 2 tablets by mouth daily.     traMADol 50 MG tablet  Commonly known as:  ULTRAM  Take 50-100 mg by mouth 2 (two) times daily as needed for moderate pain.        No orders of the defined types were placed in this encounter.    Immunization History  Administered Date(s) Administered  . Influenza Split 10/12/2011, 10/13/2011  . Influenza,inj,Quad PF,36+ Mos 05/06/2015  . Influenza-Unspecified  06/10/2014  . PPD Test 10/13/2015    Social History  Substance Use Topics  . Smoking status: Former Smoker -- 2.00 packs/day for 20 years    Types: Cigarettes    Quit date: 09/10/1976  . Smokeless tobacco: Never Used  . Alcohol Use: No     Comment: FORMER ALCOHOLIC; "sober since [id number]"    Review of Systems  DATA OBTAINED:  from patient, nurse, PT GENERAL:  no fevers, fatigue, appetite changes SKIN: No itching, rash HEENT: No complaint RESPIRATORY: No cough, wheezing, SOB CARDIAC: No chest pain, palpitations, lower extremity edema  GI: No abdominal pain, No N/V/D or constipation, No heartburn or reflux  GU: No dysuria, frequency or urgency, or incontinence  MUSCULOSKELETAL: c/ol shoulder pain since fall at home;pt report minimal movement and strength 2/2 paon NEUROLOGIC: No headache, dizziness ; restless legs at nigt interfere with sleep PSYCHIATRIC: No overt anxiety or sadness  Filed Vitals:   10/22/15 1437  BP: 136/66  Pulse: 70  Temp: 98 F (36.7 C)  Resp: 18    Physical Exam  GENERAL APPEARANCE: Alert, conversant, No acute distress  SKIN: No diaphoresis rash, or wounds HEENT: Unremarkable RESPIRATORY: Breathing is even, unlabored. Lung sounds are clear   CARDIOVASCULAR: Heart RRR no murmurs, rubs or gallops. No peripheral edema  GASTROINTESTINAL: Abdomen is soft, non-tender, not distended w/ normal bowel sounds.  GENITOURINARY: Bladder non tender, not distended  MUSCULOSKELETAL: No abnormal joints or musculature NEUROLOGIC: Cranial nerves 2-12 grossly intact. Moves all extremities PSYCHIATRIC: Mood and affect appropriate to situation, no behavioral issues  Patient Active Problem List   Diagnosis Date Noted  . GERD (gastroesophageal reflux disease) 10/22/2015  . LOC (loss of consciousness) 10/22/2015  . Restless legs 10/22/2015  . Left shoulder pain 10/22/2015  . Acute CHF (congestive heart failure) ([place]) 10/14/2015  . Acute on chronic diastolic heart  failure ([place]) 10/12/2015  . SOB (shortness of breath)   . Acute on chronic congestive heart failure ([place])   . Faintness   . Unable to walk 10/08/2015  . Fall with possible syncope 10/08/2015  . Chronic venous stasis dermatitis of both lower extremities 10/08/2015  . Left knee pain 09/23/2015  . Inability to ambulate due to knee 09/23/2015  . Left hip pain 09/23/2015  . CAD in native artery 09/15/2015  . PVC's (premature ventricular contractions) 09/15/2015  . Sinus pause 09/14/2015  . Hypomagnesemia 09/14/2015  . Urinary tract infection due to extended-spectrum beta lactamase (ESBL)-producing Klebsiella 09/12/2015  . Candidiasis, intertriginous 09/12/2015  . Morbid obesity ([place]) 07/03/2015  . Type II diabetes mellitus with peripheral circulatory disorder, uncontrolled ([place]) 05/09/2015  . Hyperlipidemia LDL goal <70 05/02/2015  . Abnormal nuclear stress test 05/02/2015  . Neurogenic bladder 04/26/2015  . Pressure ulcer 04/15/2015  . UTI (urinary tract infection) due to urinary indwelling catheter ([place]) 04/14/2015  . History of DVT (deep vein thrombosis) 12/08/2014  . Right-sided abdominal pain of unknown cause   . Diabetes type 2, uncontrolled ([place])   . Chronic diastolic CHF (congestive heart failure) ([place]) 04/01/2014  . Chest pain with moderate risk of acute coronary syndrome 12/23/2013  . Chronic indwelling Foley catheter 12/23/2013  . CVA (cerebral infarction) 03/11/2012  . NSVT (nonsustained ventricular tachycardia) ([place]) 10/26/2011  . Diabetic neuropathy ([place]) 10/10/2011  . OSA (obstructive sleep apnea) 10/10/2011    CBC    Component Value Date/Time   WBC 8.9 10/13/2015 0449   RBC 4.22 10/13/2015 0449   HGB 12.9* 10/13/2015 0449   HCT 38.1* 10/13/2015 0449   PLT 211 10/13/2015 0449   MCV 90.3 10/13/2015 0449   LYMPHSABS 1.6 10/08/2015 0549   MONOABS 0.8 10/08/2015 0549   EOSABS 0.8* 10/08/2015 0549   BASOSABS 0.1 10/08/2015 0549    CMP     Component Value  Date/Time   NA 134* 10/14/2015 0504   K 4.0 10/14/2015 0504   CL 94* 10/14/2015 0504  CO2 30 10/14/2015 0504   GLUCOSE 141* 10/14/2015 0504   BUN 12 10/14/2015 0504   CREATININE 0.82 10/14/2015 0504   CREATININE 0.81 09/20/2014 1537   CALCIUM 8.8* 10/14/2015 0504   PROT 6.2* 10/08/2015 0549   ALBUMIN 2.9* 10/08/2015 0549   AST 14* 10/08/2015 0549   ALT 10* 10/08/2015 0549   ALKPHOS 79 10/08/2015 0549   BILITOT 0.5 10/08/2015 0549   GFRNONAA >60 10/14/2015 0504   GFRAA >60 10/14/2015 0504    Assessment and Plan  LOC (loss of consciousness) Sudden and returns to normal in 5-10 seconds; highly suspicious for V tach runs; have asked pt to be set up for cards appt ASAP; until then bed or WC only; pt understands  Restless legs Have started pt in requi nightly;will monitor  Left shoulder pain Reported in notes from hospital with fall at home;per PT with pain and sx suggestive of rotator cuff tear; have writtten order for referral to ortho.   Time spent > 35 min;> 50% of time with patient was spent reviewing records, labs, tests and studies, counseling and developing plan of care  Hennie Duos, MD

## 2015-10-21 DIAGNOSIS — M6281 Muscle weakness (generalized): Secondary | ICD-10-CM | POA: Diagnosis not present

## 2015-10-21 DIAGNOSIS — R55 Syncope and collapse: Secondary | ICD-10-CM | POA: Diagnosis not present

## 2015-10-21 DIAGNOSIS — M25512 Pain in left shoulder: Secondary | ICD-10-CM | POA: Diagnosis not present

## 2015-10-22 ENCOUNTER — Encounter: Payer: Self-pay | Admitting: Internal Medicine

## 2015-10-22 DIAGNOSIS — R4020 Unspecified coma: Secondary | ICD-10-CM | POA: Insufficient documentation

## 2015-10-22 DIAGNOSIS — M25512 Pain in left shoulder: Secondary | ICD-10-CM | POA: Insufficient documentation

## 2015-10-22 DIAGNOSIS — G2581 Restless legs syndrome: Secondary | ICD-10-CM | POA: Insufficient documentation

## 2015-10-22 DIAGNOSIS — K219 Gastro-esophageal reflux disease without esophagitis: Secondary | ICD-10-CM | POA: Insufficient documentation

## 2015-10-22 NOTE — Assessment & Plan Note (Signed)
SNF - Wound care and Eucerin cream.

## 2015-10-22 NOTE — Assessment & Plan Note (Signed)
SNF -  ot stated as unconrolled; cont prilosec 40 mg daily

## 2015-10-22 NOTE — Assessment & Plan Note (Signed)
Sudden and returns to normal in 5-10 seconds; highly suspicious for V tach runs; have asked pt to be set up for cards appt ASAP; until then bed or WC only; pt understands

## 2015-10-22 NOTE — Assessment & Plan Note (Signed)
Patient presents with a mechanical fall and passing out event. Echocardiogram October 16- ejection fraction 0000000 with diastolic dysfunction Cardiac cath August 16- 60% RCA stenosis medically managed. EEG in the past has remained negative, EEG this admission is completed with the results is pending. No evidence of seizure activity in the hospital. Assistant bradycardia on telemetry, one episode of NSVT, one episode of 2 second pause which is thought to be blocked PAC. Cardiology was consulted and recommended no further cardiac workup is needed at present. Patient did not have any further episodes of syncope here in the hospital.

## 2015-10-22 NOTE — Assessment & Plan Note (Signed)
Reported in notes from hospital with fall at home;per PT with pain and sx suggestive of rotator cuff tear; have writtten order for referral to ortho.

## 2015-10-22 NOTE — Assessment & Plan Note (Signed)
SNF - a1c 10.4; cont insulin ;follow ac and qHS

## 2015-10-22 NOTE — Assessment & Plan Note (Signed)
SNF - calorie appropriate diet and OT/PT

## 2015-10-22 NOTE — Assessment & Plan Note (Signed)
SNF- - ENZYMES NEG;CONT ASA, IMDUR AND METOPROLOL

## 2015-10-22 NOTE — Assessment & Plan Note (Signed)
Echocardiogram October 16- ejection fraction 0000000 with diastolic dysfunction Cardiac cath August 16- 60% RCA stenosis medically managed. Assistant bradycardia on telemetry, one episode of NSVT, one episode of 2 second pause which is thought to be blocked PAC. Cardiology was consulted and recommended no further cardiac workup is needed at present. Patient did not have any further episodes of syncope here in the hospital.

## 2015-10-22 NOTE — Assessment & Plan Note (Signed)
Have started pt in requi nightly;will monitor

## 2015-10-22 NOTE — Assessment & Plan Note (Addendum)
Urine is growing Staphylococcus aureus as well as Pseudomonas. As per discussion with infectious disease earlier by my colleague Pseudomonas is more likely a colonization. He was treated with broad-spectrum antibiotics and later on was ciprofloxacin and it was discontinued. Patient will complete fosfomycin course. SNF - one more dose fosfomycin at [facility]

## 2015-10-22 NOTE — Assessment & Plan Note (Addendum)
SNF - cont chronic foley; cont ditropan

## 2015-10-22 NOTE — Assessment & Plan Note (Signed)
SNF 2/2 OA and morbid obesity; OT/PT

## 2015-10-22 NOTE — Assessment & Plan Note (Signed)
-  28 L since admission. inaccurate ins and outs. Weight is down 24 pounds. Renal function remains stable. Cardiology recommended to discharging on 40 mg Lasix twice a day. SNF -  repeat BMP in 1 week.

## 2015-10-23 DIAGNOSIS — R55 Syncope and collapse: Secondary | ICD-10-CM | POA: Diagnosis not present

## 2015-10-23 DIAGNOSIS — M25512 Pain in left shoulder: Secondary | ICD-10-CM | POA: Diagnosis not present

## 2015-10-23 DIAGNOSIS — M6281 Muscle weakness (generalized): Secondary | ICD-10-CM | POA: Diagnosis not present

## 2015-10-24 ENCOUNTER — Encounter: Payer: Self-pay | Admitting: *Deleted

## 2015-10-24 ENCOUNTER — Other Ambulatory Visit: Payer: Self-pay | Admitting: *Deleted

## 2015-10-24 DIAGNOSIS — M25512 Pain in left shoulder: Secondary | ICD-10-CM | POA: Diagnosis not present

## 2015-10-24 DIAGNOSIS — R55 Syncope and collapse: Secondary | ICD-10-CM | POA: Diagnosis not present

## 2015-10-24 DIAGNOSIS — M6281 Muscle weakness (generalized): Secondary | ICD-10-CM | POA: Diagnosis not present

## 2015-10-24 NOTE — Patient Outreach (Signed)
[facility]) Care Management  10/24/2015  [name] 1946/05/07 [id number]   [name] was able to make contact with patient today to discuss recent placement at [facility] for long-term care services, as well as to ensure that patient is working well with therapies (both physical and occupational).  Patient's first response to [name] was, "I'm definitely going to need more space".  [name] inquired as to whether or not patient has received word when a private room will become available to him?  Patient denied, reminding himself that he needed to follow-up with the admissions coordinator regarding private room availability.  [name] then inquired as to whether or not patient is ready to proceed with arranging for an agency to come in and clean his home while he is at the facility.  Patient reported that his friend, [name] is in the process of "taking care of all that".  [name] inquired as to how [name] could be of further assistance to patient at this time.  Patient reported, "Just come and visit me, or even call, from time-to-time".  [name] admitted that patient appears to be adjusting well to his new surroundings and may actually be enjoying the social interaction.  Patient did not choose to confirm or deny.  Despite extensive work with physical and occupational therapies, patient reports that he is still unable to ambulate independently.  Patient admits to ongoing pain in his left shoulder, which he rates, at present, at a 3 on the pain scale.  Prior to terminating the call, patient reported, "I'm not giving up hope that one day I will be able to return home to live by myself again". [name] will perform a case closure on patient, as all goals of treatment have been met from social work standpoint and no additional social work needs have been identified at this time. [name] will notify patient's RNCM with [place] [facility], [name] of [name]'s plans to close patient's  case. [name] will fax a correspondence letter to patient's Primary Care Physician, [name] to ensure that [name] is aware of [name]'s plans to close case.   [name] will submit a case closure request to [name], Care Management Assistant with [facility] [facility], in the form of an In [employer].  [name] will ensure that Mrs. Laurance Flatten is aware of Marline Backbone, RNCM with [place] [facility], continued involvement with patient's care.  [name], BSW, MSW, LCSW  Licensed Education officer, environmental Health System  [place] [address] [place], [place] [address] Physical Address-[address], [place], [place] [address] Toll Free Main # [phone] Fax # [phone] Cell # [phone]  Fax # [phone]  [name] complies with [employer] civil rights laws and does not discriminate on the basis of race, color, national origin, age, disability, or sex.  Espaol (Spanish)  [place] cumple con las leyes federales de derechos civiles aplicables y no discrimina por motivos de raza, color, nacionalidad, edad, discapacidad o sexo.     Ti?ng Vi?t ([place])  [place] tun th? lu?t dn quy?n hi?n hnh c?a Lin bang v khng phn bi?t ?i x? d?a trn ch?ng t?c, mu da, ngu?n g?c qu?c gia, ? tu?i, khuy?t t?t, ho?c gi?i tnh.     (Arabic)    [place]

## 2015-10-24 NOTE — Patient Outreach (Signed)
[facility]) Care Management  10/24/2015  [name] 10/21/1945 [id number]   Care Coordination/ case closure:  Received message from [facility] social worker that patient will remain at SNF for long term.   PLAN: Will close case and notify MD.             Will send case closure letter to MD.  [name], RN, BSN, CEN [place] Coordinator [phone]

## 2015-10-25 DIAGNOSIS — M6281 Muscle weakness (generalized): Secondary | ICD-10-CM | POA: Diagnosis not present

## 2015-10-25 DIAGNOSIS — M25512 Pain in left shoulder: Secondary | ICD-10-CM | POA: Diagnosis not present

## 2015-10-25 DIAGNOSIS — R55 Syncope and collapse: Secondary | ICD-10-CM | POA: Diagnosis not present

## 2015-10-26 DIAGNOSIS — M6281 Muscle weakness (generalized): Secondary | ICD-10-CM | POA: Diagnosis not present

## 2015-10-26 DIAGNOSIS — M25512 Pain in left shoulder: Secondary | ICD-10-CM | POA: Diagnosis not present

## 2015-10-26 DIAGNOSIS — R55 Syncope and collapse: Secondary | ICD-10-CM | POA: Diagnosis not present

## 2015-10-27 DIAGNOSIS — M6281 Muscle weakness (generalized): Secondary | ICD-10-CM | POA: Diagnosis not present

## 2015-10-27 DIAGNOSIS — M25512 Pain in left shoulder: Secondary | ICD-10-CM | POA: Diagnosis not present

## 2015-10-27 DIAGNOSIS — R55 Syncope and collapse: Secondary | ICD-10-CM | POA: Diagnosis not present

## 2015-10-28 DIAGNOSIS — M25512 Pain in left shoulder: Secondary | ICD-10-CM | POA: Diagnosis not present

## 2015-10-28 DIAGNOSIS — R55 Syncope and collapse: Secondary | ICD-10-CM | POA: Diagnosis not present

## 2015-10-28 DIAGNOSIS — M6281 Muscle weakness (generalized): Secondary | ICD-10-CM | POA: Diagnosis not present

## 2015-10-29 DIAGNOSIS — M6281 Muscle weakness (generalized): Secondary | ICD-10-CM | POA: Diagnosis not present

## 2015-10-29 DIAGNOSIS — R55 Syncope and collapse: Secondary | ICD-10-CM | POA: Diagnosis not present

## 2015-10-29 DIAGNOSIS — M25512 Pain in left shoulder: Secondary | ICD-10-CM | POA: Diagnosis not present

## 2015-10-30 DIAGNOSIS — M6281 Muscle weakness (generalized): Secondary | ICD-10-CM | POA: Diagnosis not present

## 2015-10-30 DIAGNOSIS — M25512 Pain in left shoulder: Secondary | ICD-10-CM | POA: Diagnosis not present

## 2015-10-30 DIAGNOSIS — R55 Syncope and collapse: Secondary | ICD-10-CM | POA: Diagnosis not present

## 2015-10-31 DIAGNOSIS — R55 Syncope and collapse: Secondary | ICD-10-CM | POA: Diagnosis not present

## 2015-10-31 DIAGNOSIS — M25512 Pain in left shoulder: Secondary | ICD-10-CM | POA: Diagnosis not present

## 2015-10-31 DIAGNOSIS — M6281 Muscle weakness (generalized): Secondary | ICD-10-CM | POA: Diagnosis not present

## 2015-11-01 ENCOUNTER — Encounter (INDEPENDENT_AMBULATORY_CARE_PROVIDER_SITE_OTHER): Payer: Medicare Other

## 2015-11-01 ENCOUNTER — Encounter: Payer: Self-pay | Admitting: Physician Assistant

## 2015-11-01 ENCOUNTER — Ambulatory Visit (INDEPENDENT_AMBULATORY_CARE_PROVIDER_SITE_OTHER): Payer: Medicare Other | Admitting: Physician Assistant

## 2015-11-01 VITALS — BP 118/72 | HR 72 | Ht 73.0 in

## 2015-11-01 DIAGNOSIS — M6281 Muscle weakness (generalized): Secondary | ICD-10-CM | POA: Diagnosis not present

## 2015-11-01 DIAGNOSIS — R55 Syncope and collapse: Secondary | ICD-10-CM | POA: Diagnosis not present

## 2015-11-01 DIAGNOSIS — I2510 Atherosclerotic heart disease of native coronary artery without angina pectoris: Secondary | ICD-10-CM

## 2015-11-01 DIAGNOSIS — I5032 Chronic diastolic (congestive) heart failure: Secondary | ICD-10-CM

## 2015-11-01 DIAGNOSIS — M25512 Pain in left shoulder: Secondary | ICD-10-CM | POA: Diagnosis not present

## 2015-11-01 NOTE — Assessment & Plan Note (Signed)
Still having some angina relieved with sublingual nitroglycerin. Continue Imdur. See cath report above.

## 2015-11-01 NOTE — Assessment & Plan Note (Signed)
Heart failure seems compensated. We have no weights on him from the nursing home. He needs to be weighed daily. Continue current dose of Lasix but may need to adjust.

## 2015-11-01 NOTE — Patient Instructions (Signed)
Medication Instructions:  None  Labwork: BMET and CBC today  Testing/Procedures: Your physician has recommended that you wear an event monitor. Event monitors are medical devices that record the heart's electrical activity. Doctors most often [place] these monitors to diagnose arrhythmias. Arrhythmias are problems with the speed or rhythm of the heartbeat. The monitor is a small, portable device. You can wear one while you do your normal daily activities. This is usually used to diagnose what is causing palpitations/syncope (passing out).   Follow-Up: Your physician recommends that you schedule a follow-up appointment in: 1 month with Dr. Marlou Porch.   Any Other Special Instructions Will Be Listed Below (If Applicable).  Your physician would like for the facility you live at to do daily weights and orthostatic blood pressures.    If you need a refill on your cardiac medications before your next appointment, please call your pharmacy.

## 2015-11-01 NOTE — Assessment & Plan Note (Signed)
Patient had syncopal episode with fall prompting hospitalization. He had some nonsustained V. tach in 2 second pauses felt secondary to blocked PACs. He has had normal LV function and no sustained bradycardia. He's had recurrent syncope at the nursing facility during rehabilitation. He does not recall these events and they only last 5-10 seconds according to the notes I received. I discussed this patient in detail with Dr. Marlou Porch who recommends 30 day event recorder, continue physical therapy, orthostatic blood pressures and daily weights. Follow-up with him in one month. We did order blood work today but the lab was unable to access it

## 2015-11-01 NOTE — Progress Notes (Signed)
Cardiology Office Note   Date:  11/01/2015   ID:  [name], DOB 07/22/1946, MRN [id number]  PCP:  Irven Shelling, MD  Cardiologist:  Dr. Marlou Porch Chief Complaint:e    History of Present Illness: [name] is a 70 y.o. male who presents for hospital follow-up. He was hospitalized after syncope and a fall at home. Echo in late 2016 revealed normal LV function, cardiac catheterization was done at that time and was complicated by an RCA dissection which spontaneously healed. No interventions were performed.   In the hospital He was found to have a 2 second pause  Beta blocker had been stopped in 09/2015 for 2-3 second pauses and heart rates in the 40s felt possibly secondary to OSA. Had a run nonsustained V. tach on 10/11/15. Brief pauses were felt likely secondary to blocked PACs. He also had some chest pain treated with sublingual nitroglycerin. With normal LV function sustained VT was less likely there was no sustained bradycardia long enough to cause complete loss of consciousness. While in the hospital he diuresed 28.5 L and weight was down 25 pounds from admission to 338 pounds. He was transitioned to oral Lasix.  He is brought to the office today by EMS on a stretcher. Since he's been at the nursing home he has had 5 episodes of syncope while undergoing PT at [name] and rehabilitation. One was while he was in  The  the bed one was in the wheelchair. He has had no dizziness or warning signs or memory of the incident. Episodes Last 5-10 seconds. He has had some chest pain with light activity relieved with nitroglycerin. Physical therapy has declined to work with him until he is seen here. He is frustrated because he was walking when he went to the facility but now he is not doing anything. I have no records that they've checked orthostatic blood pressures or daily weights. He has occasional chest tightness relieved with nitroglycerin. He denies dyspnea. He has chronic lower  extremity edema. EEG was normal.    Past Medical History  Diagnosis Date  . Obese   . Hypertension   . Diabetic neuropathy, painful ([place])   . Chronic indwelling Foley catheter   . Bladder spasms   . Hyperlipidemia   . GERD (gastroesophageal reflux disease)   . Neurogenic bladder   . CAD (coronary artery disease)   . PONV (postoperative nausea and vomiting)   . CHF (congestive heart failure) ([place])   . DVT (deep venous thrombosis) (HCC) 1980's    LLE  . Type II diabetes mellitus ([place])   . Diabetic diarrhea ([place])   . Pneumonia 1999  . OSA (obstructive sleep apnea)     "they wanted me to wear a mask; I couldn't" (11/10/2014)  . Arthritis     "hands, ankles, knees" (11/10/2014)  . Chronic lower back pain   . Stroke [facility]) 10/2011; 06/2014    right hand numbness/notes 10/20/2011, pt not sure he had stroke in 2013; "right hand weaker and right face not quite right since" (11/10/2014)    Past Surgical History  Procedure Laterality Date  . Gastroplasty    . Total knee arthroplasty Right 1990's  . Left heart catheterization with coronary angiogram N/A 10/24/2011    Procedure: LEFT HEART CATHETERIZATION WITH CORONARY ANGIOGRAM;  Surgeon: Candee Furbish, MD;  Location: [facility] CATH LAB;  Service: Cardiovascular;  Laterality: N/A;  right radial artery approach  . Cholecystectomy open  1970's?  . Joint replacement    .  Carpal tunnel release Bilateral ~ 2003-2004  . Cardiac catheterization N/A 05/02/2015    Procedure: Left Heart Cath and Coronary Angiography;  Surgeon: Lorretta Harp, MD;  Location: [facility] CV LAB;  Service: Cardiovascular;  Laterality: N/A;  . Cardiac catheterization N/A 05/04/2015    Procedure: Left Heart Cath and Coronary Angiography;  Surgeon: Wellington Hampshire, MD;  Location: [facility] CV LAB;  Service: Cardiovascular;  Laterality: N/A;     Current Outpatient Prescriptions  Medication Sig Dispense Refill  . alum & mag hydroxide-simeth (MAALOX/MYLANTA) 200-200-20 MG/5ML  suspension Take 30 mLs by mouth every 6 (six) hours as needed for indigestion or heartburn (dyspepsia). 355 mL 0  . aspirin EC 81 MG tablet Take 81 mg by mouth every morning.     . diphenoxylate-atropine (LOMOTIL) 2.5-0.025 MG tablet Take 1 tablet by mouth 3 (three) times daily as needed for diarrhea or loose stools.   2  . fexofenadine (ALLEGRA) 180 MG tablet Take 180 mg by mouth daily as needed for allergies or rhinitis.    . furosemide (LASIX) 40 MG tablet Take 1 tablet (40 mg total) by mouth 2 (two) times daily. 60 tablet 0  . insulin NPH Human (HUMULIN N) 100 UNIT/ML injection Inject 0.28 mLs (28 Units total) into the skin 2 (two) times daily at 8 am and 10 pm. 10 mL 11  . isosorbide mononitrate (IMDUR) 60 MG 24 hr tablet Take 1 tablet (60 mg total) by mouth daily.    Marland Kitchen lisinopril (PRINIVIL,ZESTRIL) 5 MG tablet Take 5 mg by mouth daily.  3  . magnesium oxide (MAG-OX) 400 (241.3 Mg) MG tablet Take 1 tablet (400 mg total) by mouth daily. 10 tablet 0  . metoprolol (LOPRESSOR) 50 MG tablet Take 25 mg by mouth 2 (two) times daily.     . mupirocin cream (BACTROBAN) 2 % Apply topically 2 (two) times daily. 15 g 0  . nitroGLYCERIN (NITROSTAT) 0.4 MG SL tablet Place 1 tablet (0.4 mg total) under the tongue every 5 (five) minutes as needed for chest pain. 25 tablet 2  . omeprazole (PRILOSEC) 40 MG capsule Take 1 capsule (40 mg total) by mouth daily. 30 capsule 0  . oxybutynin (DITROPAN) 5 MG tablet Take 5 mg by mouth daily as needed for bladder spasms.     . potassium chloride (KLOR-CON) 20 MEQ packet Take 40 mEq by mouth once.    . potassium chloride SA (K-DUR,KLOR-CON) 20 MEQ tablet Take 2 tablets (40 mEq total) by mouth daily. 30 tablet 0  . rOPINIRole (REQUIP) 0.5 MG tablet Take 0.5 mg by mouth at bedtime.    . senna-docusate (SENOKOT-S) 8.6-50 MG per tablet Take 2 tablets by mouth daily. (Patient taking differently: Take 2 tablets by mouth 2 (two) times daily as needed for moderate constipation. )  28 tablet 0  . traMADol (ULTRAM) 50 MG tablet Take 50-100 mg by mouth 2 (two) times daily as needed for moderate pain.      No current facility-administered medications for this visit.    Allergies:   Ace inhibitors; Lipitor; Metformin and related; Cefadroxil; Cephalexin; Morphine and related; and Robaxin    Social History:  The patient  reports that he quit smoking about 39 years ago. His smoking use included Cigarettes. He has a 40 pack-year smoking history. He has never used smokeless tobacco. He reports that he does not drink alcohol or use illicit drugs.   Family History:  The patient's   family history includes Cancer in his  brother and brother; Diabetes type I in his father; Hypertension in his mother.    ROS:  Please see the history of present illness.   Otherwise, review of systems are positive for indwelling catheter for neurogenic bladder.   All other systems are reviewed and negative.    PHYSICAL EXAM: VS:  BP 118/72 mmHg  Pulse 72  Ht 6\' 1"  (1.854 m) , BMI There is no weight on file to calculate BMI. GEN: Obese, in no acute distress Neck: no JVD, HJR, carotid bruits, or masses Cardiac:  RRR; distant heart sounds, no murmurs,gallop, rubs, thrill or heave,  Respiratory:  clear to auscultation bilaterally, normal work of breathing GI: soft, nontender, nondistended, + BS MS: no deformity or atrophy Extremities: Chronic edema with brawny changes Skin: warm and dry, no rash Neuro:  Strength and sensation are intact    EKG:  EKG is ordered today. The ekg ordered today demonstrates normal sinus rhythm with first-degree AV block nonspecific ST-T wave changes, no acute change Recent Labs: 09/15/2015: TSH 2.954 09/23/2015: B Natriuretic Peptide 130.9* 10/08/2015: ALT 10* 10/11/2015: Magnesium 1.8 10/13/2015: Hemoglobin 12.9*; Platelets 211 10/14/2015: BUN 12; Creatinine, Ser 0.82; Potassium 4.0; Sodium 134*    Lipid Panel    Component Value Date/Time   CHOL 163 04/30/2015 1047    TRIG 133 04/30/2015 1047   HDL 30* 04/30/2015 1047   CHOLHDL 5.4 04/30/2015 1047   VLDL 27 04/30/2015 1047   LDLCALC 106* 04/30/2015 1047      Wt Readings from Last 3 Encounters:  10/14/15 339 lb 11.2 oz (154.087 kg)  09/25/15 338 lb 13.6 oz (153.7 kg)  09/15/15 354 lb 4.8 oz (160.709 kg)      Other studies Reviewed: Additional studies/ records that were reviewed today include and review of the records demonstrates:  Cardiac catheterization 04/2015 Coronary Findings     Dominance: Right    Right Coronary Artery   . Dist RCA lesion, 60% stenosed.        IMPRESSION: 1. Reversible ischemia involving the mid and distal lateral wall and the proximal inferior wall.   2. Hypokinesis involving the entire left ventricle with the exception of the anterior wall. Akinesis involving the upper septum at the base of the heart.   3. Left ventricular ejection fraction 32%   4. High-risk stress test findings*.   *2012 Appropriate Use Criteria for Coronary Revascularization Focused Update: J Am Coll Cardiol. [id number]. http://content.airportbarriers.com.aspx?articleid=1201161     Electronically Signed   By: Evangeline Dakin M.D.   On: 05/01/2015 12:26  2-D echo 07/04/15 Study Conclusions  - Left ventricle: The cavity size was normal. There was mild   concentric hypertrophy. Systolic function was normal. The   estimated ejection fraction was in the range of 55% to 60%.   Images were inadequate for LV wall motion assessment. There was   an increased relative contribution of atrial contraction to   ventricular filling. Doppler parameters are consistent with   abnormal left ventricular relaxation (grade 1 diastolic   dysfunction). - Aortic valve: Severe diffuse thickening and calcification. Valve   mobility was restricted. There was mild stenosis. Valve area   (VTI): 1.73 cm^2. Valve area (Vmax): 1.54 cm^2. Valve area   (Vmean): 1.53 cm^2. - Mitral valve: Calcified  annulus. There was trivial regurgitation. - Left atrium: The atrium was severely dilated.    ASSESSMENT AND PLAN: Syncope Patient had syncopal episode with fall prompting hospitalization. He had some nonsustained V. tach in 2 second pauses felt secondary  to blocked PACs. He has had normal LV function and no sustained bradycardia. He's had recurrent syncope at the nursing facility during rehabilitation. He does not recall these events and they only last 5-10 seconds according to the notes I received. I discussed this patient in detail with Dr. Marlou Porch who recommends 30 day event recorder, continue physical therapy, orthostatic blood pressures and daily weights. Follow-up with him in one month. We did order blood work today but the lab was unable to access it  CAD in native artery Still having some angina relieved with sublingual nitroglycerin. Continue Imdur. See cath report above.  Chronic diastolic CHF (congestive heart failure) (HCC) Heart failure seems compensated. We have no weights on him from the nursing home. He needs to be weighed daily. Continue current dose of Lasix but may need to adjust.     Signed, Ermalinda Barrios, PA-C  11/01/2015 2:13 PM    [facility] [address] [place], [place], [place]  [address] Phone: [phone]; Fax: [phone]

## 2015-11-02 DIAGNOSIS — R55 Syncope and collapse: Secondary | ICD-10-CM | POA: Diagnosis not present

## 2015-11-02 DIAGNOSIS — M25512 Pain in left shoulder: Secondary | ICD-10-CM | POA: Diagnosis not present

## 2015-11-02 DIAGNOSIS — M6281 Muscle weakness (generalized): Secondary | ICD-10-CM | POA: Diagnosis not present

## 2015-11-03 ENCOUNTER — Encounter: Payer: Self-pay | Admitting: Internal Medicine

## 2015-11-03 ENCOUNTER — Non-Acute Institutional Stay (SKILLED_NURSING_FACILITY): Payer: Medicare Other | Admitting: Internal Medicine

## 2015-11-03 ENCOUNTER — Telehealth: Payer: Self-pay | Admitting: Cardiology

## 2015-11-03 DIAGNOSIS — M6281 Muscle weakness (generalized): Secondary | ICD-10-CM | POA: Diagnosis not present

## 2015-11-03 DIAGNOSIS — R55 Syncope and collapse: Secondary | ICD-10-CM

## 2015-11-03 DIAGNOSIS — R21 Rash and other nonspecific skin eruption: Secondary | ICD-10-CM

## 2015-11-03 DIAGNOSIS — M25512 Pain in left shoulder: Secondary | ICD-10-CM

## 2015-11-03 LAB — CBC AND DIFFERENTIAL
HEMATOCRIT: 40 % — AB (ref 41–53)
Hemoglobin: 12.7 g/dL — AB (ref 13.5–17.5)
WBC: 9.2 10^3/mL

## 2015-11-03 NOTE — Telephone Encounter (Signed)
Called the pt and made contact the first time, in regards to his recorded event monitor, [employer] reported.  Asked the pt if he pressed the button to send a recording, for symptoms.  Per the pt, he states he accidentally pressed the button.  Pt states he has no cardiac complaints or symptoms at all. Pt states that he is currently at his PCP office for a follow-up on a Orthopedic issue and obtaining a referral to see an Orthopedic Specialist.  Pt states that he could have accidentally pressed the button during the exam, for he heard the monitor repeatedly go off, and telling him to contact [employer].  Pt states he is completely asymptomatic and has not had any pre-syncopal or syncopal episodes at all.  Advised the pt to call [employer] to inform them that he is asymptomatic, and accidentally pushed the button.  [employer] will fax recording to our office for MD review.  Will have this signed and placed in Dr Kingsley Plan mailbox for review on return to the office.  Will route this message to Dr Marlou Porch and covering nurse as an fyi.

## 2015-11-03 NOTE — Telephone Encounter (Signed)
[employer] called in to report that the pt pressed his event monitor button and it recorded the pt in a normal sinus rhythm, with sinus tach at 109 bpm.  [employer] states that they were unable to make contact with the pt, and the reason why he had the monitor placed was for syncopal episodes.  [employer] states that they will fax this recording to our office now for further review and follow-up.

## 2015-11-03 NOTE — Progress Notes (Signed)
MRN: [id number] Name: [name]  Sex: male Age: 70 y.o. DOB: [birth year]  [facility] #: Karren Burly Facility/Room:131 Level Of Care: SNF Provider: Inocencio Homes D Emergency Contacts: Extended Emergency Contact Information Primary Emergency Contact: Whiting,[name] Address: [address]          [place], [place] [address] [name] of [place] Phone: [phone] Relation: Friend Secondary Emergency Contact: [name] [place] of [place] Phone: [phone] Relation: None  Code Status:   Allergies: Ace inhibitors; Lipitor; Metformin and related; Cefadroxil; Cephalexin; Morphine and related; and Robaxin  Chief Complaint  Patient presents with  . Acute Visit    HPI: Patient is 70 y.o. male who is being seen for continued c/o rash and itching. Pt admits rash is better, maybe worse at night. It is best with the bactroban cream, but there is never enough. Pt has had rash since arrival to SNF. Pt also c/o continued pain L shoulder , as does PT for him, that is interfering with pt's rehab.  Past Medical History  Diagnosis Date  . Obese   . Hypertension   . Diabetic neuropathy, painful ([place])   . Chronic indwelling Foley catheter   . Bladder spasms   . Hyperlipidemia   . GERD (gastroesophageal reflux disease)   . Neurogenic bladder   . CAD (coronary artery disease)   . PONV (postoperative nausea and vomiting)   . CHF (congestive heart failure) ([place])   . DVT (deep venous thrombosis) (HCC) 1980's    LLE  . Type II diabetes mellitus ([place])   . Diabetic diarrhea ([place])   . Pneumonia 1999  . OSA (obstructive sleep apnea)     "they wanted me to wear a mask; I couldn't" (11/10/2014)  . Arthritis     "hands, ankles, knees" (11/10/2014)  . Chronic lower back pain   . Stroke [name]) 10/2011; 06/2014    right hand numbness/notes 10/20/2011, pt not sure he had stroke in 2013; "right hand weaker and right face not quite right since" (11/10/2014)    Past Surgical History   Procedure Laterality Date  . Gastroplasty    . Total knee arthroplasty Right 1990's  . Left heart catheterization with coronary angiogram N/A 10/24/2011    Procedure: LEFT HEART CATHETERIZATION WITH CORONARY ANGIOGRAM;  Surgeon: Candee Furbish, MD;  Location: [facility] CATH LAB;  Service: Cardiovascular;  Laterality: N/A;  right radial artery approach  . Cholecystectomy open  1970's?  . Joint replacement    . Carpal tunnel release Bilateral ~ 2003-2004  . Cardiac catheterization N/A 05/02/2015    Procedure: Left Heart Cath and Coronary Angiography;  Surgeon: Lorretta Harp, MD;  Location: [facility] CV LAB;  Service: Cardiovascular;  Laterality: N/A;  . Cardiac catheterization N/A 05/04/2015    Procedure: Left Heart Cath and Coronary Angiography;  Surgeon: Wellington Hampshire, MD;  Location: [facility] CV LAB;  Service: Cardiovascular;  Laterality: N/A;      Medication List       This list is accurate as of: 11/03/15 11:59 PM.  Always use your most recent med list.               alum & mag hydroxide-simeth 200-200-20 MG/5ML suspension  Commonly known as:  MAALOX/MYLANTA  Take 30 mLs by mouth every 6 (six) hours as needed for indigestion or heartburn (dyspepsia).     aspirin EC 81 MG tablet  Take 81 mg by mouth every morning.     diphenoxylate-atropine 2.5-0.025 MG tablet  Commonly known  as:  LOMOTIL  Take 1 tablet by mouth 3 (three) times daily as needed for diarrhea or loose stools.     fexofenadine 180 MG tablet  Commonly known as:  ALLEGRA  Take 180 mg by mouth daily as needed for allergies or rhinitis.     furosemide 40 MG tablet  Commonly known as:  LASIX  Take 1 tablet (40 mg total) by mouth 2 (two) times daily.     insulin NPH Human 100 UNIT/ML injection  Commonly known as:  HUMULIN N  Inject 0.28 mLs (28 Units total) into the skin 2 (two) times daily at 8 am and 10 pm.     isosorbide mononitrate 60 MG 24 hr tablet  Commonly known as:  IMDUR  Take 1 tablet (60 mg total)  by mouth daily.     lisinopril 5 MG tablet  Commonly known as:  PRINIVIL,ZESTRIL  Take 5 mg by mouth daily.     magnesium oxide 400 (241.3 Mg) MG tablet  Commonly known as:  MAG-OX  Take 1 tablet (400 mg total) by mouth daily.     metoprolol 50 MG tablet  Commonly known as:  LOPRESSOR  Take 25 mg by mouth 2 (two) times daily.     mupirocin cream 2 %  Commonly known as:  BACTROBAN  Apply topically 2 (two) times daily.     nitroGLYCERIN 0.4 MG SL tablet  Commonly known as:  NITROSTAT  Place 1 tablet (0.4 mg total) under the tongue every 5 (five) minutes as needed for chest pain.     omeprazole 40 MG capsule  Commonly known as:  PRILOSEC  Take 1 capsule (40 mg total) by mouth daily.     oxybutynin 5 MG tablet  Commonly known as:  DITROPAN  Take 5 mg by mouth daily as needed for bladder spasms.     potassium chloride 20 MEQ packet  Commonly known as:  KLOR-CON  Take 40 mEq by mouth once.     potassium chloride SA 20 MEQ tablet  Commonly known as:  K-DUR,KLOR-CON  Take 2 tablets (40 mEq total) by mouth daily.     rOPINIRole 0.5 MG tablet  Commonly known as:  REQUIP  Take 0.5 mg by mouth at bedtime.     senna-docusate 8.6-50 MG tablet  Commonly known as:  Senokot-S  Take 2 tablets by mouth daily.     traMADol 50 MG tablet  Commonly known as:  ULTRAM  Take 50-100 mg by mouth 2 (two) times daily as needed for moderate pain.        No orders of the defined types were placed in this encounter.    Immunization History  Administered Date(s) Administered  . Influenza Split 10/12/2011, 10/13/2011  . Influenza,inj,Quad PF,36+ Mos 05/06/2015  . Influenza-Unspecified 06/10/2014  . PPD Test 10/13/2015    Social History  Substance Use Topics  . Smoking status: Former Smoker -- 2.00 packs/day for 20 years    Types: Cigarettes    Quit date: 09/10/1976  . Smokeless tobacco: Never Used  . Alcohol Use: No     Comment: FORMER ALCOHOLIC; "sober since [id number]"     Review of Systems  DATA OBTAINED: from patient, nurse, medical record, family member GENERAL:  no fevers, fatigue, appetite changes SKIN: No itching, rash HEENT: No complaint RESPIRATORY: No cough, wheezing, SOB CARDIAC: No chest pain, palpitations, lower extremity edema  GI: No abdominal pain, No N/V/D or constipation, No heartburn or reflux  GU: No dysuria, frequency or  urgency, or incontinence  MUSCULOSKELETAL: L shoulder pain NEUROLOGIC: No headache, dizziness  PSYCHIATRIC: No overt anxiety or sadness  Filed Vitals:   11/03/15 2124  BP: 136/66  Pulse: 70  Temp: 98 F (36.7 C)  Resp: 18    Physical Exam  GENERAL APPEARANCE: Alert, conversant, No acute distress  SKIN:dry scabbing rash spread equally over arms legs torso HEENT: Unremarkable RESPIRATORY: Breathing is even, unlabored. Lung sounds are clear   CARDIOVASCULAR: Heart RRR no murmurs, rubs or gallops. No peripheral edema  GASTROINTESTINAL: Abdomen is soft, non-tender, not distended w/ normal bowel sounds.  GENITOURINARY: Bladder non tender, not distended  MUSCULOSKELETAL: L shoulder -TTP and with active and passive ROM NEUROLOGIC: Cranial nerves 2-12 grossly intact. Moves all extremities PSYCHIATRIC: Mood and affect appropriate to situation, no behavioral issues  Patient Active Problem List   Diagnosis Date Noted  . Rash and nonspecific skin eruption 11/07/2015  . Syncope 11/01/2015  . GERD (gastroesophageal reflux disease) 10/22/2015  . LOC (loss of consciousness) 10/22/2015  . Restless legs 10/22/2015  . Left shoulder pain 10/22/2015  . Acute CHF (congestive heart failure) ([place]) 10/14/2015  . Acute on chronic diastolic heart failure ([place]) 10/12/2015  . SOB (shortness of breath)   . Acute on chronic congestive heart failure ([place])   . Faintness   . Unable to walk 10/08/2015  . Fall with possible syncope 10/08/2015  . Chronic venous stasis dermatitis of both lower extremities 10/08/2015  . Left knee  pain 09/23/2015  . Inability to ambulate due to knee 09/23/2015  . Left hip pain 09/23/2015  . CAD in native artery 09/15/2015  . PVC's (premature ventricular contractions) 09/15/2015  . Sinus pause 09/14/2015  . Hypomagnesemia 09/14/2015  . Urinary tract infection due to extended-spectrum beta lactamase (ESBL)-producing Klebsiella 09/12/2015  . Candidiasis, intertriginous 09/12/2015  . Morbid obesity ([place]) 07/03/2015  . Type II diabetes mellitus with peripheral circulatory disorder, uncontrolled ([place]) 05/09/2015  . Hyperlipidemia LDL goal <70 05/02/2015  . Abnormal nuclear stress test 05/02/2015  . Neurogenic bladder 04/26/2015  . Pressure ulcer 04/15/2015  . UTI (urinary tract infection) due to urinary indwelling catheter ([place]) 04/14/2015  . History of DVT (deep vein thrombosis) 12/08/2014  . Right-sided abdominal pain of unknown cause   . Diabetes type 2, uncontrolled ([place])   . Chronic diastolic CHF (congestive heart failure) ([place]) 04/01/2014  . Chest pain with moderate risk of acute coronary syndrome 12/23/2013  . Chronic indwelling Foley catheter 12/23/2013  . CVA (cerebral infarction) 03/11/2012  . NSVT (nonsustained ventricular tachycardia) ([place]) 10/26/2011  . Diabetic neuropathy ([place]) 10/10/2011  . OSA (obstructive sleep apnea) 10/10/2011    CBC    Component Value Date/Time   WBC 8.9 10/13/2015 0449   RBC 4.22 10/13/2015 0449   HGB 12.9* 10/13/2015 0449   HCT 38.1* 10/13/2015 0449   PLT 211 10/13/2015 0449   MCV 90.3 10/13/2015 0449   LYMPHSABS 1.6 10/08/2015 0549   MONOABS 0.8 10/08/2015 0549   EOSABS 0.8* 10/08/2015 0549   BASOSABS 0.1 10/08/2015 0549    CMP     Component Value Date/Time   NA 134* 10/14/2015 0504   K 4.0 10/14/2015 0504   CL 94* 10/14/2015 0504   CO2 30 10/14/2015 0504   GLUCOSE 141* 10/14/2015 0504   BUN 12 10/14/2015 0504   CREATININE 0.82 10/14/2015 0504   CREATININE 0.81 09/20/2014 1537   CALCIUM 8.8* 10/14/2015 0504   PROT 6.2*  10/08/2015 0549   ALBUMIN 2.9* 10/08/2015 0549   AST  14* 10/08/2015 0549   ALT 10* 10/08/2015 0549   ALKPHOS 79 10/08/2015 0549   BILITOT 0.5 10/08/2015 0549   GFRNONAA >60 10/14/2015 0504   GFRAA >60 10/14/2015 0504    Assessment and Plan  Rash and nonspecific skin eruption Rash, which was initially a heat rash per pt, is drying up, does appear to be better. Rash covers arms , legs and torso. Have ordered atarax 25 mg q6 and a very LARGE quantity of bactroban.  Left shoulder pain Have rewritten order for ortho referral  Syncope Pt is now wearing an event recorder and has restarted therapy. He has had a few weak spells but no fainting. Pt had multiple questions   Time spent 30 min; spent a long time speaking with pt along with exam Hennie Duos, MD

## 2015-11-04 DIAGNOSIS — M25512 Pain in left shoulder: Secondary | ICD-10-CM | POA: Diagnosis not present

## 2015-11-04 DIAGNOSIS — R55 Syncope and collapse: Secondary | ICD-10-CM | POA: Diagnosis not present

## 2015-11-04 DIAGNOSIS — M6281 Muscle weakness (generalized): Secondary | ICD-10-CM | POA: Diagnosis not present

## 2015-11-07 ENCOUNTER — Encounter: Payer: Self-pay | Admitting: Internal Medicine

## 2015-11-07 DIAGNOSIS — M6281 Muscle weakness (generalized): Secondary | ICD-10-CM | POA: Diagnosis not present

## 2015-11-07 DIAGNOSIS — M25512 Pain in left shoulder: Secondary | ICD-10-CM | POA: Diagnosis not present

## 2015-11-07 DIAGNOSIS — R21 Rash and other nonspecific skin eruption: Secondary | ICD-10-CM | POA: Insufficient documentation

## 2015-11-07 DIAGNOSIS — R55 Syncope and collapse: Secondary | ICD-10-CM | POA: Diagnosis not present

## 2015-11-07 NOTE — Assessment & Plan Note (Signed)
Rash, which was initially a heat rash per pt, is drying up, does appear to be better. Rash covers arms , legs and torso. Have ordered atarax 25 mg q6 and a very LARGE quantity of bactroban.

## 2015-11-07 NOTE — Assessment & Plan Note (Signed)
Pt is now wearing an event recorder and has restarted therapy. He has had a few weak spells but no fainting. Pt had multiple questions

## 2015-11-07 NOTE — Assessment & Plan Note (Signed)
Have rewritten order for ortho referral

## 2015-11-08 DIAGNOSIS — R55 Syncope and collapse: Secondary | ICD-10-CM | POA: Diagnosis not present

## 2015-11-08 DIAGNOSIS — M6281 Muscle weakness (generalized): Secondary | ICD-10-CM | POA: Diagnosis not present

## 2015-11-08 DIAGNOSIS — M25512 Pain in left shoulder: Secondary | ICD-10-CM | POA: Diagnosis not present

## 2015-11-09 DIAGNOSIS — M6281 Muscle weakness (generalized): Secondary | ICD-10-CM | POA: Diagnosis not present

## 2015-11-09 DIAGNOSIS — M25512 Pain in left shoulder: Secondary | ICD-10-CM | POA: Diagnosis not present

## 2015-11-09 DIAGNOSIS — R55 Syncope and collapse: Secondary | ICD-10-CM | POA: Diagnosis not present

## 2015-11-10 DIAGNOSIS — R55 Syncope and collapse: Secondary | ICD-10-CM | POA: Diagnosis not present

## 2015-11-10 DIAGNOSIS — M6281 Muscle weakness (generalized): Secondary | ICD-10-CM | POA: Diagnosis not present

## 2015-11-10 DIAGNOSIS — M25512 Pain in left shoulder: Secondary | ICD-10-CM | POA: Diagnosis not present

## 2015-11-11 ENCOUNTER — Telehealth: Payer: Self-pay | Admitting: *Deleted

## 2015-11-11 DIAGNOSIS — R55 Syncope and collapse: Secondary | ICD-10-CM | POA: Diagnosis not present

## 2015-11-11 DIAGNOSIS — M6281 Muscle weakness (generalized): Secondary | ICD-10-CM | POA: Diagnosis not present

## 2015-11-11 DIAGNOSIS — M25512 Pain in left shoulder: Secondary | ICD-10-CM | POA: Diagnosis not present

## 2015-11-11 NOTE — Telephone Encounter (Signed)
Received critical result from [employer] from 3/317 at 11:28 AM (CT) showing sinus tachycardia with HR 141.1. [place] facility at [phone]) and spoke w/nurse [place].  She states that he did pass out briefly (maybe couple of seconds) during physical therapy this AM.  Vitals are stable and he feels fine now. Spoke w/Luke Kilroy,PA/flex who states that tracing is PAF; is on a beta blocker; normal LV function so would not recommend any further treatment.

## 2015-11-12 DIAGNOSIS — M25512 Pain in left shoulder: Secondary | ICD-10-CM | POA: Diagnosis not present

## 2015-11-12 DIAGNOSIS — M6281 Muscle weakness (generalized): Secondary | ICD-10-CM | POA: Diagnosis not present

## 2015-11-12 DIAGNOSIS — R55 Syncope and collapse: Secondary | ICD-10-CM | POA: Diagnosis not present

## 2015-11-13 DIAGNOSIS — R55 Syncope and collapse: Secondary | ICD-10-CM | POA: Diagnosis not present

## 2015-11-13 DIAGNOSIS — M25512 Pain in left shoulder: Secondary | ICD-10-CM | POA: Diagnosis not present

## 2015-11-13 DIAGNOSIS — M6281 Muscle weakness (generalized): Secondary | ICD-10-CM | POA: Diagnosis not present

## 2015-11-14 DIAGNOSIS — M6281 Muscle weakness (generalized): Secondary | ICD-10-CM | POA: Diagnosis not present

## 2015-11-14 DIAGNOSIS — R55 Syncope and collapse: Secondary | ICD-10-CM | POA: Diagnosis not present

## 2015-11-14 DIAGNOSIS — M25512 Pain in left shoulder: Secondary | ICD-10-CM | POA: Diagnosis not present

## 2015-11-15 ENCOUNTER — Non-Acute Institutional Stay (SKILLED_NURSING_FACILITY): Payer: Medicare Other | Admitting: Adult Health

## 2015-11-15 ENCOUNTER — Encounter: Payer: Self-pay | Admitting: Adult Health

## 2015-11-15 DIAGNOSIS — M25512 Pain in left shoulder: Secondary | ICD-10-CM | POA: Diagnosis not present

## 2015-11-15 DIAGNOSIS — M6281 Muscle weakness (generalized): Secondary | ICD-10-CM | POA: Diagnosis not present

## 2015-11-15 DIAGNOSIS — B86 Scabies: Secondary | ICD-10-CM

## 2015-11-15 DIAGNOSIS — R55 Syncope and collapse: Secondary | ICD-10-CM | POA: Diagnosis not present

## 2015-11-15 LAB — BASIC METABOLIC PANEL
BUN: 17 mg/dL (ref 4–21)
Creatinine: 0.6 mg/dL (ref 0.6–1.3)
Glucose: 349 mg/dL
POTASSIUM: 4.2 mmol/L (ref 3.4–5.3)
SODIUM: 135 mmol/L — AB (ref 137–147)

## 2015-11-15 NOTE — Progress Notes (Signed)
Patient ID: [name], male   DOB: 1946-07-26, 70 y.o.   MRN: [id number]   Facility:  [facility]       Allergies  Allergen Reactions  . Ace Inhibitors Swelling    Pt tolerates lisinopril  . Lipitor [Atorvastatin Calcium] Swelling  . Metformin And Related Swelling  . Cefadroxil Hives  . Cephalexin Hives  . Morphine And Related Other (See Comments)    Sweating, feels like is "in rocky boat."  . Robaxin [Methocarbamol] Other (See Comments)    Feels like he is shaky    Chief Complaint  Patient presents with  . Acute Visit    Scabies    HPI:  Staff reports that he has a rash "all over". He states he has had the rash for about 1-2 weeks. The rash is very itchy and he has not any relief from the itch or the rash. The rash appears to be a scabies rash. There are no reports of fever present.     Past Medical History  Diagnosis Date  . Obese   . Hypertension   . Diabetic neuropathy, painful ([place])   . Chronic indwelling Foley catheter   . Bladder spasms   . Hyperlipidemia   . GERD (gastroesophageal reflux disease)   . Neurogenic bladder   . CAD (coronary artery disease)   . PONV (postoperative nausea and vomiting)   . CHF (congestive heart failure) ([place])   . DVT (deep venous thrombosis) ([facility]) 1980's    LLE  . Type II diabetes mellitus ([place])   . Diabetic diarrhea ([place])   . Pneumonia 1999  . OSA (obstructive sleep apnea)     "they wanted me to wear a mask; I couldn't" (11/10/2014)  . Arthritis     "hands, ankles, knees" (11/10/2014)  . Chronic lower back pain   . Stroke [facility]) 10/2011; 06/2014    right hand numbness/notes 10/20/2011, pt not sure he had stroke in 2013; "right hand weaker and right face not quite right since" (11/10/2014)    Past Surgical History  Procedure Laterality Date  . Gastroplasty    . Total knee arthroplasty Right 1990's  . Left heart catheterization with coronary angiogram N/A 10/24/2011    Procedure: LEFT HEART CATHETERIZATION WITH CORONARY  ANGIOGRAM;  Surgeon: Candee Furbish, MD;  Location: [facility] CATH LAB;  Service: Cardiovascular;  Laterality: N/A;  right radial artery approach  . Cholecystectomy open  1970's?  . Joint replacement    . Carpal tunnel release Bilateral ~ 2003-2004  . Cardiac catheterization N/A 05/02/2015    Procedure: Left Heart Cath and Coronary Angiography;  Surgeon: Lorretta Harp, MD;  Location: [place] CV LAB;  Service: Cardiovascular;  Laterality: N/A;  . Cardiac catheterization N/A 05/04/2015    Procedure: Left Heart Cath and Coronary Angiography;  Surgeon: Wellington Hampshire, MD;  Location: [place] CV LAB;  Service: Cardiovascular;  Laterality: N/A;    VITAL SIGNS BP 144/81 mmHg  Pulse 77  Temp(Src) 98.2 F (36.8 C) (Oral)  Resp 18  SpO2 98%  Patient's Medications  New Prescriptions   No medications on file  Previous Medications   ALUM & MAG HYDROXIDE-SIMETH (MAALOX/MYLANTA) 200-200-20 MG/5ML SUSPENSION    Take 30 mLs by mouth every 6 (six) hours as needed for indigestion or heartburn (dyspepsia).   ASPIRIN EC 81 MG TABLET    Take 81 mg by mouth every morning.    DIPHENOXYLATE-ATROPINE (LOMOTIL) 2.5-0.025 MG TABLET    Take 1 tablet by mouth 3 (three)  times daily as needed for diarrhea or loose stools.    FEXOFENADINE (ALLEGRA) 180 MG TABLET    Take 180 mg by mouth daily as needed for allergies or rhinitis.   FUROSEMIDE (LASIX) 40 MG TABLET    Take 1 tablet (40 mg total) by mouth 2 (two) times daily.   HYDROXYZINE (ATARAX/VISTARIL) 25 MG TABLET    Take 25 mg by mouth 2 (two) times daily.   HYDROXYZINE (ATARAX/VISTARIL) 50 MG TABLET    Take 50 mg by mouth at bedtime.   INSULIN NPH HUMAN (HUMULIN N) 100 UNIT/ML INJECTION    Inject 0.28 mLs (28 Units total) into the skin 2 (two) times daily at 8 am and 10 pm.   ISOSORBIDE MONONITRATE (IMDUR) 60 MG 24 HR TABLET    Take 1 tablet (60 mg total) by mouth daily.   LISINOPRIL (PRINIVIL,ZESTRIL) 5 MG TABLET    Take 5 mg by mouth daily.   MAGNESIUM OXIDE  (MAG-OX) 400 (241.3 MG) MG TABLET    Take 1 tablet (400 mg total) by mouth daily.   METOPROLOL (LOPRESSOR) 50 MG TABLET    Take 25 mg by mouth 2 (two) times daily.    MUPIROCIN CREAM (BACTROBAN) 2 %    Apply topically 2 (two) times daily.   NITROGLYCERIN (NITROSTAT) 0.4 MG SL TABLET    Place 1 tablet (0.4 mg total) under the tongue every 5 (five) minutes as needed for chest pain.   OMEPRAZOLE (PRILOSEC) 40 MG CAPSULE    Take 1 capsule (40 mg total) by mouth daily.   OXYBUTYNIN (DITROPAN) 5 MG TABLET    Take 5 mg by mouth daily as needed for bladder spasms.    POTASSIUM CHLORIDE SA (K-DUR,KLOR-CON) 20 MEQ TABLET    Take 2 tablets (40 mEq total) by mouth daily.   ROPINIROLE (REQUIP) 0.5 MG TABLET    Take 0.5 mg by mouth at bedtime.   SENNA-DOCUSATE (SENOKOT-S) 8.6-50 MG PER TABLET    Take 2 tablets by mouth daily.   TRAMADOL (ULTRAM) 50 MG TABLET    Take 50 mg by mouth 2 (two) times daily as needed for moderate pain.    VITAMIN C (ASCORBIC ACID) 500 MG TABLET    Take 500 mg by mouth 2 (two) times daily.   ZINC SULFATE 220 MG CAPSULE    Take 220 mg by mouth daily. X 45 days  Modified Medications   No medications on file  Discontinued Medications   POTASSIUM CHLORIDE (KLOR-CON) 20 MEQ PACKET    Take 40 mEq by mouth once. Reported on 11/15/2015     SIGNIFICANT DIAGNOSTIC EXAMS  10-12-15: chest x-ray: Enlarged cardiac silhouette. Pulmonary vascular congestion.  10-18-15: EEG: This is a normal EEG for the patients stated age.  There were no focal, hemispheric or lateralizing features.  No epileptiform activity was recorded.  A normal EEG does not exclude the diagnosis of a seizure disorder and if seizure remains high on the list of differential diagnosis, an ambulatory EEG may be of value.  Clinical correlation is required.    LABS REVIEWED:   09-10-15: hgb a1c 10.4 09-15-15; tsh 2.954 10-12-15; wbc 5.9; hgb 11.8; hct 36.1; mcv 93.5; pt 195; glucose 187; bun 8; creat 0.78; k+ 5.0; na++136 11-03-15: wbc  9.2; hgb 12.7; hct 39.7; mcv 89.3; plt 267; glucose 105; bun 17.2; creat 0.61; k+ 4.2; na++135     Review of Systems  Constitutional: Negative for malaise/fatigue.  Respiratory: Negative for cough and shortness of breath.  Cardiovascular: Negative for chest pain, palpitations and leg swelling.  Gastrointestinal: Negative for heartburn, abdominal pain and constipation.  Musculoskeletal: Negative for myalgias, back pain and joint pain.  Skin: Positive for itching and rash.  Neurological: Negative for dizziness.  Psychiatric/Behavioral: The patient is not nervous/anxious.     Physical Exam  Constitutional: He is oriented to person, place, and time. No distress.  Obese   Eyes: Conjunctivae are normal.  Neck: Neck supple. No JVD present. No thyromegaly present.  Cardiovascular: Normal rate, regular rhythm and intact distal pulses.   Respiratory: Effort normal and breath sounds normal. No respiratory distress. He has no wheezes.  GI: Soft. Bowel sounds are normal. He exhibits no distension. There is no tenderness.  Genitourinary:  Has foley   Musculoskeletal: He exhibits edema.  Able to move all extremities   Lymphadenopathy:    He has no cervical adenopathy.  Neurological: He is alert and oriented to person, place, and time.  Skin: Skin is warm and dry. Rash noted. He is not diaphoretic. There is erythema.  Has scabies rash throughout body   Psychiatric: He has a normal mood and affect.       ASSESSMENT/ PLAN:  1. Scabies: will use permethrin topical cream 5% today then repeat in one week.     Ok Edwards NP [facility]  Contact [phone] Monday through Friday 8am- 5pm  After hours call [phone]

## 2015-11-16 DIAGNOSIS — R55 Syncope and collapse: Secondary | ICD-10-CM | POA: Diagnosis not present

## 2015-11-16 DIAGNOSIS — M6281 Muscle weakness (generalized): Secondary | ICD-10-CM | POA: Diagnosis not present

## 2015-11-16 DIAGNOSIS — M25512 Pain in left shoulder: Secondary | ICD-10-CM | POA: Diagnosis not present

## 2015-11-17 DIAGNOSIS — R55 Syncope and collapse: Secondary | ICD-10-CM | POA: Diagnosis not present

## 2015-11-17 DIAGNOSIS — M6281 Muscle weakness (generalized): Secondary | ICD-10-CM | POA: Diagnosis not present

## 2015-11-17 DIAGNOSIS — M25512 Pain in left shoulder: Secondary | ICD-10-CM | POA: Diagnosis not present

## 2015-11-18 ENCOUNTER — Ambulatory Visit: Payer: Medicare Other | Admitting: Internal Medicine

## 2015-11-18 DIAGNOSIS — M25512 Pain in left shoulder: Secondary | ICD-10-CM | POA: Diagnosis not present

## 2015-11-18 DIAGNOSIS — R55 Syncope and collapse: Secondary | ICD-10-CM | POA: Diagnosis not present

## 2015-11-18 DIAGNOSIS — M6281 Muscle weakness (generalized): Secondary | ICD-10-CM | POA: Diagnosis not present

## 2015-11-19 DIAGNOSIS — R55 Syncope and collapse: Secondary | ICD-10-CM | POA: Diagnosis not present

## 2015-11-19 DIAGNOSIS — M25512 Pain in left shoulder: Secondary | ICD-10-CM | POA: Diagnosis not present

## 2015-11-19 DIAGNOSIS — M6281 Muscle weakness (generalized): Secondary | ICD-10-CM | POA: Diagnosis not present

## 2015-11-20 DIAGNOSIS — R55 Syncope and collapse: Secondary | ICD-10-CM | POA: Diagnosis not present

## 2015-11-20 DIAGNOSIS — M6281 Muscle weakness (generalized): Secondary | ICD-10-CM | POA: Diagnosis not present

## 2015-11-20 DIAGNOSIS — M25512 Pain in left shoulder: Secondary | ICD-10-CM | POA: Diagnosis not present

## 2015-11-21 DIAGNOSIS — R55 Syncope and collapse: Secondary | ICD-10-CM | POA: Diagnosis not present

## 2015-11-21 DIAGNOSIS — M6281 Muscle weakness (generalized): Secondary | ICD-10-CM | POA: Diagnosis not present

## 2015-11-21 DIAGNOSIS — M25512 Pain in left shoulder: Secondary | ICD-10-CM | POA: Diagnosis not present

## 2015-11-22 ENCOUNTER — Non-Acute Institutional Stay (SKILLED_NURSING_FACILITY): Payer: Medicare Other | Admitting: Adult Health

## 2015-11-22 ENCOUNTER — Encounter: Payer: Self-pay | Admitting: Adult Health

## 2015-11-22 DIAGNOSIS — E1151 Type 2 diabetes mellitus with diabetic peripheral angiopathy without gangrene: Secondary | ICD-10-CM | POA: Diagnosis not present

## 2015-11-22 DIAGNOSIS — M25512 Pain in left shoulder: Secondary | ICD-10-CM | POA: Diagnosis not present

## 2015-11-22 DIAGNOSIS — Z960 Presence of urogenital implants: Secondary | ICD-10-CM

## 2015-11-22 DIAGNOSIS — K219 Gastro-esophageal reflux disease without esophagitis: Secondary | ICD-10-CM

## 2015-11-22 DIAGNOSIS — E1165 Type 2 diabetes mellitus with hyperglycemia: Secondary | ICD-10-CM | POA: Diagnosis not present

## 2015-11-22 DIAGNOSIS — Z9289 Personal history of other medical treatment: Secondary | ICD-10-CM | POA: Diagnosis not present

## 2015-11-22 DIAGNOSIS — I2510 Atherosclerotic heart disease of native coronary artery without angina pectoris: Secondary | ICD-10-CM

## 2015-11-22 DIAGNOSIS — I5032 Chronic diastolic (congestive) heart failure: Secondary | ICD-10-CM

## 2015-11-22 DIAGNOSIS — IMO0002 Reserved for concepts with insufficient information to code with codable children: Secondary | ICD-10-CM

## 2015-11-22 DIAGNOSIS — G2581 Restless legs syndrome: Secondary | ICD-10-CM

## 2015-11-22 DIAGNOSIS — N319 Neuromuscular dysfunction of bladder, unspecified: Secondary | ICD-10-CM

## 2015-11-22 DIAGNOSIS — M6281 Muscle weakness (generalized): Secondary | ICD-10-CM | POA: Diagnosis not present

## 2015-11-22 DIAGNOSIS — R55 Syncope and collapse: Secondary | ICD-10-CM | POA: Diagnosis not present

## 2015-11-22 DIAGNOSIS — I639 Cerebral infarction, unspecified: Secondary | ICD-10-CM | POA: Diagnosis not present

## 2015-11-22 DIAGNOSIS — Z978 Presence of other specified devices: Secondary | ICD-10-CM

## 2015-11-22 LAB — BASIC METABOLIC PANEL: GLUCOSE: 197 mg/dL

## 2015-11-22 NOTE — Progress Notes (Signed)
Patient ID: [name], male   DOB: [birth year], 70 y.o.   MRN: [id number]   Facility:  [facility]       Allergies  Allergen Reactions  . Ace Inhibitors Swelling    Pt tolerates lisinopril  . Lipitor [Atorvastatin Calcium] Swelling  . Metformin And Related Swelling  . Cefadroxil Hives  . Cephalexin Hives  . Morphine And Related Other (See Comments)    Sweating, feels like is "in rocky boat."  . Robaxin [Methocarbamol] Other (See Comments)    Feels like he is shaky    Chief Complaint  Patient presents with  . Medical Management of Chronic Issues    Follow-up    HPI:  He is a resident of this facility being seen for the management of his chronic illnesses. Overall his status is stable. He is due to have his second treatment of his scabies rash; which as greatly improved. He is not voicing any complaints today. There are no nursing concerns at this time.    Past Medical History  Diagnosis Date  . Obese   . Hypertension   . Diabetic neuropathy, painful ([place])   . Chronic indwelling Foley catheter   . Bladder spasms   . Hyperlipidemia   . GERD (gastroesophageal reflux disease)   . Neurogenic bladder   . CAD (coronary artery disease)   . PONV (postoperative nausea and vomiting)   . CHF (congestive heart failure) ([place])   . DVT (deep venous thrombosis) ([facility]) 1980's    LLE  . Type II diabetes mellitus ([place])   . Diabetic diarrhea ([place])   . Pneumonia 1999  . OSA (obstructive sleep apnea)     "they wanted me to wear a mask; I couldn't" (11/10/2014)  . Arthritis     "hands, ankles, knees" (11/10/2014)  . Chronic lower back pain   . Stroke [facility]) 10/2011; 06/2014    right hand numbness/notes 10/20/2011, pt not sure he had stroke in 2013; "right hand weaker and right face not quite right since" (11/10/2014)    Past Surgical History  Procedure Laterality Date  . Gastroplasty    . Total knee arthroplasty Right 1990's  . Left heart catheterization with coronary angiogram N/A  10/24/2011    Procedure: LEFT HEART CATHETERIZATION WITH CORONARY ANGIOGRAM;  Surgeon: Candee Furbish, MD;  Location: [facility] CATH LAB;  Service: Cardiovascular;  Laterality: N/A;  right radial artery approach  . Cholecystectomy open  1970's?  . Joint replacement    . Carpal tunnel release Bilateral ~ 2003-2004  . Cardiac catheterization N/A 05/02/2015    Procedure: Left Heart Cath and Coronary Angiography;  Surgeon: Lorretta Harp, MD;  Location: [place] CV LAB;  Service: Cardiovascular;  Laterality: N/A;  . Cardiac catheterization N/A 05/04/2015    Procedure: Left Heart Cath and Coronary Angiography;  Surgeon: Wellington Hampshire, MD;  Location: [place] CV LAB;  Service: Cardiovascular;  Laterality: N/A;    VITAL SIGNS BP 144/81 mmHg  Pulse 77  Temp(Src) 98.2 F (36.8 C) (Oral)  Resp 18  SpO2 98%  Patient's Medications  New Prescriptions   No medications on file  Previous Medications   ALUM & MAG HYDROXIDE-SIMETH (MAALOX/MYLANTA) 200-200-20 MG/5ML SUSPENSION    Take 30 mLs by mouth every 6 (six) hours as needed for indigestion or heartburn (dyspepsia).   ASPIRIN EC 81 MG TABLET    Take 81 mg by mouth every morning.    DIPHENOXYLATE-ATROPINE (LOMOTIL) 2.5-0.025 MG TABLET    Take 1 tablet by mouth  3 (three) times daily as needed for diarrhea or loose stools.    FEXOFENADINE (ALLEGRA) 180 MG TABLET    Take 180 mg by mouth daily as needed for allergies or rhinitis.   FUROSEMIDE (LASIX) 40 MG TABLET    Take 1 tablet (40 mg total) by mouth 2 (two) times daily.   HYDROXYZINE (ATARAX/VISTARIL) 25 MG TABLET    Take 25 mg by mouth 2 (two) times daily.   HYDROXYZINE (ATARAX/VISTARIL) 50 MG TABLET    Take 50 mg by mouth at bedtime.   INSULIN NPH HUMAN (HUMULIN N) 100 UNIT/ML INJECTION    Inject 0.28 mLs (28 Units total) into the skin 2 (two) times daily at 8 am and 10 pm.   ISOSORBIDE MONONITRATE (IMDUR) 60 MG 24 HR TABLET    Take 1 tablet (60 mg total) by mouth daily.   LISINOPRIL  (PRINIVIL,ZESTRIL) 5 MG TABLET    Take 5 mg by mouth daily.   MAGNESIUM OXIDE (MAG-OX) 400 (241.3 MG) MG TABLET    Take 1 tablet (400 mg total) by mouth daily.   METOPROLOL (LOPRESSOR) 50 MG TABLET    Take 25 mg by mouth 2 (two) times daily.    MUPIROCIN CREAM (BACTROBAN) 2 %    Apply topically 2 (two) times daily.   NITROGLYCERIN (NITROSTAT) 0.4 MG SL TABLET    Place 1 tablet (0.4 mg total) under the tongue every 5 (five) minutes as needed for chest pain.   OMEPRAZOLE (PRILOSEC) 40 MG CAPSULE    Take 1 capsule (40 mg total) by mouth daily.   OXYBUTYNIN (DITROPAN) 5 MG TABLET    Take 5 mg by mouth daily as needed for bladder spasms.    POTASSIUM CHLORIDE SA (K-DUR,KLOR-CON) 20 MEQ TABLET    Take 2 tablets (40 mEq total) by mouth daily.   ROPINIROLE (REQUIP) 0.5 MG TABLET    Take 0.5 mg by mouth at bedtime.   SENNA-DOCUSATE (SENOKOT-S) 8.6-50 MG PER TABLET    Take 2 tablets by mouth daily.   TRAMADOL (ULTRAM) 50 MG TABLET    Take 50 mg by mouth 2 (two) times daily as needed for moderate pain.    VITAMIN C (ASCORBIC ACID) 500 MG TABLET    Take 500 mg by mouth 2 (two) times daily.   ZINC SULFATE 220 MG CAPSULE    Take 220 mg by mouth daily. X 45 days until 12/09/15.  Modified Medications   No medications on file  Discontinued Medications   No medications on file     SIGNIFICANT DIAGNOSTIC EXAMS   10-12-15: chest x-ray: Enlarged cardiac silhouette. Pulmonary vascular congestion.  10-18-15: EEG: This is a normal EEG for the patients stated age.  There were no focal, hemispheric or lateralizing features.  No epileptiform activity was recorded.  A normal EEG does not exclude the diagnosis of a seizure disorder and if seizure remains high on the list of differential diagnosis, an ambulatory EEG may be of value.  Clinical correlation is required.    LABS REVIEWED:   09-10-15: hgb a1c 10.4 09-15-15; tsh 2.954 10-12-15; wbc 5.9; hgb 11.8; hct 36.1; mcv 93.5; pt 195; glucose 187; bun 8; creat 0.78; k+  5.0; na++136 11-03-15: wbc 9.2; hgb 12.7; hct 39.7; mcv 89.3; plt 267; glucose 105; bun 17.2; creat 0.61; k+ 4.2; na++135     Review of Systems  Constitutional: Negative for malaise/fatigue.  Respiratory: Negative for cough and shortness of breath.   Cardiovascular: Negative for chest pain, palpitations and leg swelling.  Gastrointestinal: Negative for heartburn, abdominal pain and constipation.  Musculoskeletal: Negative for myalgias, back pain and joint pain.  Skin: Positive for itching and rash.  Neurological: Negative for dizziness.  Psychiatric/Behavioral: The patient is not nervous/anxious.     Physical Exam  Constitutional: He is oriented to person, place, and time. No distress.  Obese   Eyes: Conjunctivae are normal.  Neck: Neck supple. No JVD present. No thyromegaly present.  Cardiovascular: Normal rate, regular rhythm and intact distal pulses.   Respiratory: Effort normal and breath sounds normal. No respiratory distress. He has no wheezes.  GI: Soft. Bowel sounds are normal. He exhibits no distension. There is no tenderness.  Genitourinary:  Has foley   Musculoskeletal: He exhibits edema.  Able to move all extremities   Lymphadenopathy:    He has no cervical adenopathy.  Neurological: He is alert and oriented to person, place, and time.  Skin: Skin is warm and dry. Rash noted. He is not diaphoretic. There is erythema.  Has scabies rash throughout body has greatly improved.    Psychiatric: He has a normal mood and affect.    ASSESSMENT/PLAN  1. Gerd: will continue prilosec 40 mg daily   2. Diastolic heart failure: will continue lasix 40 mg twice daily with k+ 40 meq daily   3. Hypertension: will continue lopressor 12.5 mg twice daily   4. Neurogenic bladder: has chronic indwelling foley will continue ditropan 5 mg daily as needed for spasms  5. RLS: is stable will continue requip 0.5 mg nightly   6. Diabetes: will continue NPH 28 units twice daily   7. CAD:  no complaint of chest pain; will continue asa 81 mg daily has ntg prn  8. Allergic rhinitis: will continue allegra 180 mg daily as needed  9. CVA: is neurologically stable will continue asa 81 mg daily   10. Scabies: will complete second treatment and will continue to monitor           Ok Edwards NP [facility]  Contact [phone] Monday through Friday 8am- 5pm  After hours call [phone]

## 2015-11-23 DIAGNOSIS — M25512 Pain in left shoulder: Secondary | ICD-10-CM | POA: Diagnosis not present

## 2015-11-23 DIAGNOSIS — R55 Syncope and collapse: Secondary | ICD-10-CM | POA: Diagnosis not present

## 2015-11-23 DIAGNOSIS — M6281 Muscle weakness (generalized): Secondary | ICD-10-CM | POA: Diagnosis not present

## 2015-11-24 DIAGNOSIS — R55 Syncope and collapse: Secondary | ICD-10-CM | POA: Diagnosis not present

## 2015-11-24 DIAGNOSIS — M6281 Muscle weakness (generalized): Secondary | ICD-10-CM | POA: Diagnosis not present

## 2015-11-24 DIAGNOSIS — M25512 Pain in left shoulder: Secondary | ICD-10-CM | POA: Diagnosis not present

## 2015-11-25 DIAGNOSIS — R55 Syncope and collapse: Secondary | ICD-10-CM | POA: Diagnosis not present

## 2015-11-25 DIAGNOSIS — M6281 Muscle weakness (generalized): Secondary | ICD-10-CM | POA: Diagnosis not present

## 2015-11-25 DIAGNOSIS — M25512 Pain in left shoulder: Secondary | ICD-10-CM | POA: Diagnosis not present

## 2015-11-28 DIAGNOSIS — R55 Syncope and collapse: Secondary | ICD-10-CM | POA: Diagnosis not present

## 2015-11-28 DIAGNOSIS — M25512 Pain in left shoulder: Secondary | ICD-10-CM | POA: Diagnosis not present

## 2015-11-28 DIAGNOSIS — M6281 Muscle weakness (generalized): Secondary | ICD-10-CM | POA: Diagnosis not present

## 2015-11-29 DIAGNOSIS — M6281 Muscle weakness (generalized): Secondary | ICD-10-CM | POA: Diagnosis not present

## 2015-11-29 DIAGNOSIS — R55 Syncope and collapse: Secondary | ICD-10-CM | POA: Diagnosis not present

## 2015-11-29 DIAGNOSIS — M25512 Pain in left shoulder: Secondary | ICD-10-CM | POA: Diagnosis not present

## 2015-11-30 DIAGNOSIS — M25512 Pain in left shoulder: Secondary | ICD-10-CM | POA: Diagnosis not present

## 2015-11-30 DIAGNOSIS — M6281 Muscle weakness (generalized): Secondary | ICD-10-CM | POA: Diagnosis not present

## 2015-11-30 DIAGNOSIS — R55 Syncope and collapse: Secondary | ICD-10-CM | POA: Diagnosis not present

## 2015-12-01 DIAGNOSIS — M25512 Pain in left shoulder: Secondary | ICD-10-CM | POA: Diagnosis not present

## 2015-12-01 DIAGNOSIS — R55 Syncope and collapse: Secondary | ICD-10-CM | POA: Diagnosis not present

## 2015-12-01 DIAGNOSIS — M6281 Muscle weakness (generalized): Secondary | ICD-10-CM | POA: Diagnosis not present

## 2015-12-02 DIAGNOSIS — M6281 Muscle weakness (generalized): Secondary | ICD-10-CM | POA: Diagnosis not present

## 2015-12-02 DIAGNOSIS — M25512 Pain in left shoulder: Secondary | ICD-10-CM | POA: Diagnosis not present

## 2015-12-02 DIAGNOSIS — R55 Syncope and collapse: Secondary | ICD-10-CM | POA: Diagnosis not present

## 2015-12-05 DIAGNOSIS — M6281 Muscle weakness (generalized): Secondary | ICD-10-CM | POA: Diagnosis not present

## 2015-12-05 DIAGNOSIS — R55 Syncope and collapse: Secondary | ICD-10-CM | POA: Diagnosis not present

## 2015-12-05 DIAGNOSIS — M25512 Pain in left shoulder: Secondary | ICD-10-CM | POA: Diagnosis not present

## 2015-12-06 DIAGNOSIS — M25512 Pain in left shoulder: Secondary | ICD-10-CM | POA: Diagnosis not present

## 2015-12-06 DIAGNOSIS — R55 Syncope and collapse: Secondary | ICD-10-CM | POA: Diagnosis not present

## 2015-12-06 DIAGNOSIS — M6281 Muscle weakness (generalized): Secondary | ICD-10-CM | POA: Diagnosis not present

## 2015-12-07 DIAGNOSIS — R55 Syncope and collapse: Secondary | ICD-10-CM | POA: Diagnosis not present

## 2015-12-07 DIAGNOSIS — M25512 Pain in left shoulder: Secondary | ICD-10-CM | POA: Diagnosis not present

## 2015-12-07 DIAGNOSIS — M6281 Muscle weakness (generalized): Secondary | ICD-10-CM | POA: Diagnosis not present

## 2015-12-08 DIAGNOSIS — R55 Syncope and collapse: Secondary | ICD-10-CM | POA: Diagnosis not present

## 2015-12-08 DIAGNOSIS — M25512 Pain in left shoulder: Secondary | ICD-10-CM | POA: Diagnosis not present

## 2015-12-08 DIAGNOSIS — M6281 Muscle weakness (generalized): Secondary | ICD-10-CM | POA: Diagnosis not present

## 2015-12-09 DIAGNOSIS — M6281 Muscle weakness (generalized): Secondary | ICD-10-CM | POA: Diagnosis not present

## 2015-12-09 DIAGNOSIS — R55 Syncope and collapse: Secondary | ICD-10-CM | POA: Diagnosis not present

## 2015-12-09 DIAGNOSIS — M25512 Pain in left shoulder: Secondary | ICD-10-CM | POA: Diagnosis not present

## 2015-12-12 DIAGNOSIS — M6281 Muscle weakness (generalized): Secondary | ICD-10-CM | POA: Diagnosis not present

## 2015-12-12 DIAGNOSIS — R55 Syncope and collapse: Secondary | ICD-10-CM | POA: Diagnosis not present

## 2015-12-12 DIAGNOSIS — M25512 Pain in left shoulder: Secondary | ICD-10-CM | POA: Diagnosis not present

## 2015-12-13 DIAGNOSIS — M25512 Pain in left shoulder: Secondary | ICD-10-CM | POA: Diagnosis not present

## 2015-12-13 DIAGNOSIS — R55 Syncope and collapse: Secondary | ICD-10-CM | POA: Diagnosis not present

## 2015-12-13 DIAGNOSIS — M6281 Muscle weakness (generalized): Secondary | ICD-10-CM | POA: Diagnosis not present

## 2015-12-14 DIAGNOSIS — M25512 Pain in left shoulder: Secondary | ICD-10-CM | POA: Diagnosis not present

## 2015-12-14 DIAGNOSIS — M6281 Muscle weakness (generalized): Secondary | ICD-10-CM | POA: Diagnosis not present

## 2015-12-14 DIAGNOSIS — R55 Syncope and collapse: Secondary | ICD-10-CM | POA: Diagnosis not present

## 2015-12-15 ENCOUNTER — Encounter: Payer: Self-pay | Admitting: Cardiology

## 2015-12-15 ENCOUNTER — Ambulatory Visit (INDEPENDENT_AMBULATORY_CARE_PROVIDER_SITE_OTHER): Payer: Medicare Other | Admitting: Cardiology

## 2015-12-15 ENCOUNTER — Other Ambulatory Visit (INDEPENDENT_AMBULATORY_CARE_PROVIDER_SITE_OTHER): Payer: Medicare Other

## 2015-12-15 VITALS — BP 124/68 | HR 76 | Ht 74.0 in | Wt 278.0 lb

## 2015-12-15 DIAGNOSIS — R55 Syncope and collapse: Secondary | ICD-10-CM | POA: Diagnosis not present

## 2015-12-15 DIAGNOSIS — M6281 Muscle weakness (generalized): Secondary | ICD-10-CM | POA: Diagnosis not present

## 2015-12-15 DIAGNOSIS — I2510 Atherosclerotic heart disease of native coronary artery without angina pectoris: Secondary | ICD-10-CM | POA: Diagnosis not present

## 2015-12-15 DIAGNOSIS — M25512 Pain in left shoulder: Secondary | ICD-10-CM | POA: Diagnosis not present

## 2015-12-15 MED ORDER — METOPROLOL TARTRATE 25 MG PO TABS: 12.5000 mg | ORAL_TABLET | Freq: Two times a day (BID) | ORAL | Status: DC

## 2015-12-15 NOTE — Patient Instructions (Signed)
Medication Instructions:  Please stop your Lisinopril and Imdur. Please decrease your Metoprolol to 25 mg 1/2 tablet twice a day. Continue all other medications as listed.  Follow-Up: Follow up in 6 months with Dr. Marlou Porch.  You will receive a letter in the mail 2 months before you are due.  Please call us when you receive this letter to schedule your follow up appointment.  If you need a refill on your cardiac medications before your next appointment, please call your pharmacy.  Thank you for choosing [facility]!!

## 2015-12-15 NOTE — Progress Notes (Signed)
Cardiology Office Note   Date:  12/15/2015   ID:  EYMEN HARGRAVES, DOB 07-28-1946, MRN [id number]  PCP:  Irven Shell[facility], MD  Cardiologist:  Dr. Marlou Porch Chief Complaint:e    History of Present Illness: [name] is a 70 y.o. male who presents for hospital follow-up. He was hospitalized after syncope and a fall at home. Echo in late 2016 revealed normal LV function, cardiac catheterization was done at that time and was complicated by an RCA dissection which spontaneously healed. No interventions were performed.   In the hospital He was found to have a 2 second pause  Beta blocker had been stopped in 09/2015 for 2-3 second pauses and heart rates in the 40s felt possibly secondary to OSA. Had a run nonsustained V. tach on 10/11/15. Brief pauses were felt likely secondary to blocked PACs. He also had some chest pain treated with sublingual nitroglycerin. With normal LV function sustained VT was less likely there was no sustained bradycardia long enough to cause complete loss of consciousness. While in the hospital he diuresed 28.5 L and weight was down 25 pounds from admission to 338 pounds. He was transitioned to oral Lasix.  He is brought to the office today by EMS on a stretcher. Since he's been at the nursing home he has had 5 episodes of syncope while undergoing PT at [place] and rehabilitation. One was while he was in  The  the bed one was in the wheelchair. He has had no dizziness or warning signs or memory of the incident. No diaphoresis Episodes Last 5-10 seconds. Thankfully, an event monitor was worn during a few of these episodes and did not show any adverse arrhythmias.  EEG was normal.     Past Medical History  Diagnosis Date  . Obese   . Hypertension   . Diabetic neuropathy, painful ([place])   . Chronic indwelling Foley catheter   . Bladder spasms   . Hyperlipidemia   . GERD (gastroesophageal reflux disease)   . Neurogenic bladder   . CAD (coronary artery disease)    . PONV (postoperative nausea and vomiting)   . CHF (congestive heart failure) ([place])   . DVT (deep venous thrombosis) (H[facility]) 1980's    LLE  . Type II diabetes mellitus ([place])   . Diabetic diarrhea ([place])   . Pneumonia 1999  . OSA (obstructive sleep apnea)     "they wanted me to wear a mask; I couldn't" (11/10/2014)  . Arthritis     "hands, ankles, knees" (11/10/2014)  . Chronic lower back pain   . Stroke [facility]) 10/2011; 06/2014    right hand numbness/notes 10/20/2011, pt not sure he had stroke in 2013; "right hand weaker and right face not quite right since" (11/10/2014)    Past Surgical History  Procedure Laterality Date  . Gastroplasty    . Total knee arthroplasty Right 1990's  . Left heart catheterization with coronary angiogram N/A 10/24/2011    Procedure: LEFT HEART CATHETERIZATION WITH CORONARY ANGIOGRAM;  Surgeon: Candee Furbish, MD;  Location: [facility] CATH LAB;  Service: Cardiovascular;  Laterality: N/A;  right radial artery approach  . Cholecystectomy open  1970's?  . Joint replacement    . Carpal tunnel release Bilateral ~ 2003-2004  . Cardiac catheterization N/A 05/02/2015    Procedure: Left Heart Cath and Coronary Angiography;  Surgeon: Lorretta Harp, MD;  Location: [place] CV LAB;  Service: Cardiovascular;  Laterality: N/A;  . Cardiac catheterization N/A 05/04/2015    Procedure: Left  Heart Cath and Coronary Angiography;  Surgeon: Wellington Hampshire, MD;  Location: [place] CV LAB;  Service: Cardiovascular;  Laterality: N/A;     Current Outpatient Prescriptions  Medication Sig Dispense Refill  . alum & mag hydroxide-simeth (MAALOX/MYLANTA) 200-200-20 MG/5ML suspension Take 30 mLs by mouth every 6 (six) hours as needed for indigestion or heartburn (dyspepsia). 355 mL 0  . aspirin EC 81 MG tablet Take 81 mg by mouth every morning.     . diphenoxylate-atropine (LOMOTIL) 2.5-0.025 MG tablet Take 1 tablet by mouth 3 (three) times daily as needed for diarrhea or loose stools.   2  .  fexofenadine (ALLEGRA) 180 MG tablet Take 180 mg by mouth daily as needed for allergies or rhinitis.    . furosemide (LASIX) 40 MG tablet Take 1 tablet (40 mg total) by mouth 2 (two) times daily. 60 tablet 0  . hydrOXYzine (ATARAX/VISTARIL) 50 MG tablet Take 50 mg by mouth at bedtime.    . insulin NPH Human (HUMULIN N) 100 UNIT/ML injection Inject 0.28 mLs (28 Units total) into the skin 2 (two) times daily at 8 am and 10 pm. 10 mL 11  . isosorbide mononitrate (IMDUR) 60 MG 24 hr tablet Take 1 tablet (60 mg total) by mouth daily.    Marland Kitchen lisinopril (PRINIVIL,ZESTRIL) 5 MG tablet Take 5 mg by mouth daily.  3  . magnesium oxide (MAG-OX) 400 (241.3 Mg) MG tablet Take 1 tablet (400 mg total) by mouth daily. 10 tablet 0  . metoprolol (LOPRESSOR) 50 MG tablet Take 25 mg by mouth 2 (two) times daily.     . mupirocin cream (BACTROBAN) 2 % Apply topically 2 (two) times daily. 15 g 0  . nitroGLYCERIN (NITROSTAT) 0.4 MG SL tablet Place 1 tablet (0.4 mg total) under the tongue every 5 (five) minutes as needed for chest pain. 25 tablet 2  . omeprazole (PRILOSEC) 40 MG capsule Take 1 capsule (40 mg total) by mouth daily. 30 capsule 0  . oxybutynin (DITROPAN) 5 MG tablet Take 5 mg by mouth daily as needed for bladder spasms.     . potassium chloride SA (K-DUR,KLOR-CON) 20 MEQ tablet Take 2 tablets (40 mEq total) by mouth daily. 30 tablet 0  . rOPINIRole (REQUIP) 0.5 MG tablet Take 0.5 mg by mouth at bedtime.    . senna (SENOKOT) 8.6 MG tablet Take 2 tablets by mouth daily as needed for constipation.    . traMADol (ULTRAM) 50 MG tablet Take 50 mg by mouth 2 (two) times daily as needed for moderate pain.     . vitamin C (ASCORBIC ACID) 500 MG tablet Take 500 mg by mouth 2 (two) times daily.     No current facility-administered medications for this visit.    Allergies:   Ace inhibitors; Lipitor; Metformin and related; Cefadroxil; Cephalexin; Morphine and related; and Robaxin    Social History:  The patient   reports that he quit smoking about 39 years ago. His smoking use included Cigarettes. He has a 40 pack-year smoking history. He has never used smokeless tobacco. He reports that he does not drink alcohol or use illicit drugs.   Family History:  The patient's   family history includes Cancer in his brother and brother; Diabetes type I in his father; Hypertension in his mother.    ROS:  Please see the history of present illness.   Otherwise, review of systems are positive for indwelling catheter for neurogenic bladder.   All other systems are reviewed and  negative.    PHYSICAL EXAM: VS:  BP 124/68 mmHg  Pulse 76  Ht 6\' 2"  (1.88 m)  Wt 278 lb (126.1 kg)  BMI 35.68 kg/m2 , BMI Body mass index is 35.68 kg/(m^2). GEN: Obese, in no acute distress Neck: no JVD, HJR, carotid bruits, or masses Cardiac:  RRR; distant heart sounds, no murmurs,gallop, rubs, thrill or heave,  Respiratory:  clear to auscultation bilaterally, normal work of breathing GI: soft, nontender, nondistended, + BS MS: no deformity or atrophy Extremities: Chronic edema with brawny changes Skin: warm and dry, skin rash noted, previously diagnosed as treated scabies. Neuro:  Strength and sensation are intact laying on stretcher.    EKG:  EKG is ordered today. The ekg ordered today demonstrates normal sinus rhythm with first-degree AV block nonspecific ST-T wave changes, no acute change Recent Labs: 09/15/2015: TSH 2.954 09/23/2015: B Natriuretic Peptide 130.9* 10/08/2015: ALT 10* 10/11/2015: Magnesium 1.8 10/13/2015: Platelets 211 11/03/2015: Hemoglobin 12.7* 11/15/2015: BUN 17; Creatinine 0.6; Potassium 4.2; Sodium 135*    Lipid Panel    Component Value Date/Time   CHOL 163 04/30/2015 1047   TRIG 133 04/30/2015 1047   HDL 30* 04/30/2015 1047   CHOLHDL 5.4 04/30/2015 1047   VLDL 27 04/30/2015 1047   LDLCALC 106* 04/30/2015 1047      Wt Readings from Last 3 Encounters:  12/15/15 278 lb (126.1 kg)  10/14/15 339 lb 11.2  oz (154.087 kg)  09/25/15 338 lb 13.6 oz (153.7 kg)      Other studies Reviewed: Additional studies/ records that were reviewed today include and review of the records demonstrates:  Cardiac catheterization 04/2015  RCA dissection which spontaneously resolved. Coronary Findings     Dominance: Right    Right Coronary Artery   . Dist RCA lesion, 60% stenosed.        Repeat Cath 05/04/15: Left ventriculography: Left ventricular systolic function is moderately reduced , LVEF is estimated at 35-40 %, there is no significant mitral regurgitation   Final Conclusions:  1. 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 There is moderate stenosis distally with occluded AV segment as documented before. Otherwise, there is mild disease affecting the LAD. 2. Moderately reduced LV systolic function with an ejection fraction of 35-40% due to nonischemic cardiomyopathy. 3. Mild gradient across the aortic valve suggestive of mild stenosis. Mildly elevated left ventricular end-diastolic pressure.   Recommendations: No revascularization is recommended. Continue medical therapy for nonobstructive coronary artery disease and cardiomyopathy.    Kathlyn Sacramento MD, [facility] 05/04/2015, 5:06 PM   NUC stress 05/01/15: (prompting cath)  IMPRESSION: 1. Reversible ischemia involving the mid and distal lateral wall and the proximal inferior wall.   2. Hypokinesis involving the entire left ventricle with the exception of the anterior wall. Akinesis involving the upper septum at the base of the heart.   3. Left ventricular ejection fraction 32%   4. High-risk stress test findings*.   *2012 Appropriate Use Criteria for Coronary Revascularization Focused Update: J Am Coll Cardiol. [id number]. http://content.airportbarriers.com.aspx?articleid=1201161     Electronically Signed   By: Evangeline Dakin M.D.   On: 05/01/2015 12:26  2-D  echo 07/04/15 Study Conclusions  - Left ventricle: The cavity size was normal. There was mild   concentric hypertrophy. Systolic function was normal. The   estimated ejection fraction was in the range of 55% to 60%.   Images were inadequate for LV wall motion assessment. There was  an increased relative contribution of atrial contraction to   ventricular filling. Doppler parameters are consistent with   abnormal left ventricular relaxation (grade 1 diastolic   dysfunction). - Aortic valve: Severe diffuse thickening and calcification. Valve   mobility was restricted. There was mild stenosis. Valve area   (VTI): 1.73 cm^2. Valve area (Vmax): 1.54 cm^2. Valve area   (Vmean): 1.53 cm^2. - Mitral valve: Calcified annulus. There was trivial regurgitation. - Left atrium: The atrium was severely dilated.  Event monitor 11/01/15  - No AFIB, no pauses, one triplet  - overall reassuring.   EEG normal on 10/18/15  ASSESSMENT AND PLAN:   Cardiomyopathy  - EF improved from NUC 32%/ Cath 35% to ECHO 60% in 2 months.   - reassuring  - Because of these episodes of unresponsiveness without a clear cause and his ejection fraction is now normal, I will discontinue his lisinopril 5 mg and discontinue his isosorbide 60 mg in case they are enhancing a decrease in blood pressure.  Syncope  - ? Syncopal activity. Bizarre activity noted at SNF.  - Looks like he is losing consciousness at the end of exertional activity when sitting for instance. during PT. I personally spoke to physical therapist Candis Shine at [phone] who has witnessed several of these episodes. Interestingly, she stated that he did have an episode where he was standing and went blank, blank stare and was unresponsive but did not fall which completely goes against decreased blood pressure/autonomic instability. I'm not quite sure what his diagnosis is however he does not demonstrate any dangerous arrhythmias in the middle of one of the  syncopal episodes. We have captured this on event monitor. There was just sinus rhythm, sinus tachycardia. Sinus tach 141 at the time. Just in case the other episodes at the end of exercise/vasodilation are autonomic related, I will decrease his blood pressure medications, stopping his lisinopril 5 mg, decreasing his metoprolol to 12.5 mg twice a day, stopping his isosorbide 60 mg. EEG normal. Could this be pseudoseizure?  - Event monitor reassuring. No adverse rhythms. No indication for pacer.   - Prior hospital stay noted 2 sec pauses. He did not recall syncope. Per report lasted 5-10 sec.   - Encourage conditioning, further activity, physical therapy.  CAD  - mild to moderate  - Stopping Imdur  - no PCI previosuly  - spontaneous resolution of RCA dissection during first cath attempt.   Scabies rash treated.   6 month follow-up.  Bobby Rumpf, MD  12/15/2015 12:09 PM    [facility] [address], [place], [place]  [address] Phone: [phone]; Fax: [phone]

## 2015-12-16 DIAGNOSIS — M25512 Pain in left shoulder: Secondary | ICD-10-CM | POA: Diagnosis not present

## 2015-12-16 DIAGNOSIS — M6281 Muscle weakness (generalized): Secondary | ICD-10-CM | POA: Diagnosis not present

## 2015-12-16 DIAGNOSIS — R55 Syncope and collapse: Secondary | ICD-10-CM | POA: Diagnosis not present

## 2015-12-19 DIAGNOSIS — R55 Syncope and collapse: Secondary | ICD-10-CM | POA: Diagnosis not present

## 2015-12-19 DIAGNOSIS — M6281 Muscle weakness (generalized): Secondary | ICD-10-CM | POA: Diagnosis not present

## 2015-12-19 DIAGNOSIS — M25512 Pain in left shoulder: Secondary | ICD-10-CM | POA: Diagnosis not present

## 2015-12-20 DIAGNOSIS — R55 Syncope and collapse: Secondary | ICD-10-CM | POA: Diagnosis not present

## 2015-12-20 DIAGNOSIS — M25512 Pain in left shoulder: Secondary | ICD-10-CM | POA: Diagnosis not present

## 2015-12-20 DIAGNOSIS — M6281 Muscle weakness (generalized): Secondary | ICD-10-CM | POA: Diagnosis not present

## 2015-12-21 DIAGNOSIS — M6281 Muscle weakness (generalized): Secondary | ICD-10-CM | POA: Diagnosis not present

## 2015-12-21 DIAGNOSIS — R55 Syncope and collapse: Secondary | ICD-10-CM | POA: Diagnosis not present

## 2015-12-21 DIAGNOSIS — M25512 Pain in left shoulder: Secondary | ICD-10-CM | POA: Diagnosis not present

## 2015-12-28 ENCOUNTER — Non-Acute Institutional Stay (SKILLED_NURSING_FACILITY): Payer: Medicare Other | Admitting: Adult Health

## 2015-12-28 ENCOUNTER — Encounter: Payer: Self-pay | Admitting: Adult Health

## 2015-12-28 DIAGNOSIS — E1151 Type 2 diabetes mellitus with diabetic peripheral angiopathy without gangrene: Secondary | ICD-10-CM

## 2015-12-28 DIAGNOSIS — I5032 Chronic diastolic (congestive) heart failure: Secondary | ICD-10-CM

## 2015-12-28 DIAGNOSIS — G2581 Restless legs syndrome: Secondary | ICD-10-CM | POA: Diagnosis not present

## 2015-12-28 DIAGNOSIS — E0841 Diabetes mellitus due to underlying condition with diabetic mononeuropathy: Secondary | ICD-10-CM | POA: Diagnosis not present

## 2015-12-28 DIAGNOSIS — Z9289 Personal history of other medical treatment: Secondary | ICD-10-CM

## 2015-12-28 DIAGNOSIS — E1165 Type 2 diabetes mellitus with hyperglycemia: Secondary | ICD-10-CM

## 2015-12-28 DIAGNOSIS — Z978 Presence of other specified devices: Secondary | ICD-10-CM

## 2015-12-28 DIAGNOSIS — IMO0002 Reserved for concepts with insufficient information to code with codable children: Secondary | ICD-10-CM

## 2015-12-28 DIAGNOSIS — Z960 Presence of urogenital implants: Secondary | ICD-10-CM

## 2015-12-28 DIAGNOSIS — I2510 Atherosclerotic heart disease of native coronary artery without angina pectoris: Secondary | ICD-10-CM | POA: Diagnosis not present

## 2015-12-28 DIAGNOSIS — N319 Neuromuscular dysfunction of bladder, unspecified: Secondary | ICD-10-CM | POA: Diagnosis not present

## 2015-12-28 NOTE — Progress Notes (Signed)
Patient ID: [name], male   DOB: [birth year], 70 y.o.   MRN: [id number]   Facility:  [facility]       Allergies  Allergen Reactions  . Ace Inhibitors Swelling    Pt tolerates lisinopril  . Lipitor [Atorvastatin Calcium] Swelling  . Metformin And Related Swelling  . Cefadroxil Hives  . Cephalexin Hives  . Morphine And Related Other (See Comments)    Sweating, feels like is "in rocky boat."  . Robaxin [Methocarbamol] Other (See Comments)    Feels like he is shaky    Chief Complaint  Patient presents with  . Medical Management of Chronic Issues    Follow up    HPI:  He is a resident of this facility being seen for the management of his chronic illnesses. Overall his status is stable. He states his has is nearly resolved; he does itch and has dry skin. He states otherwise that he is doing well. Does spend nearly all of his time in the bed. There are no nursing concerns at this time.    Past Medical History  Diagnosis Date  . Obese   . Hypertension   . Diabetic neuropathy, painful ([place])   . Chronic indwelling Foley catheter   . Bladder spasms   . Hyperlipidemia   . GERD (gastroesophageal reflux disease)   . Neurogenic bladder   . CAD (coronary artery disease)   . PONV (postoperative nausea and vomiting)   . CHF (congestive heart failure) ([place])   . DVT (deep venous thrombosis) ([facility]) 1980's    LLE  . Type II diabetes mellitus ([place])   . Diabetic diarrhea ([place])   . Pneumonia 1999  . OSA (obstructive sleep apnea)     "they wanted me to wear a mask; I couldn't" (11/10/2014)  . Arthritis     "hands, ankles, knees" (11/10/2014)  . Chronic lower back pain   . Stroke [facility]) 10/2011; 06/2014    right hand numbness/notes 10/20/2011, pt not sure he had stroke in 2013; "right hand weaker and right face not quite right since" (11/10/2014)    Past Surgical History  Procedure Laterality Date  . Gastroplasty    . Total knee arthroplasty Right 1990's  . Left heart catheterization  with coronary angiogram N/A 10/24/2011    Procedure: LEFT HEART CATHETERIZATION WITH CORONARY ANGIOGRAM;  Surgeon: Candee Furbish, MD;  Location: [facility] CATH LAB;  Service: Cardiovascular;  Laterality: N/A;  right radial artery approach  . Cholecystectomy open  1970's?  . Joint replacement    . Carpal tunnel release Bilateral ~ 2003-2004  . Cardiac catheterization N/A 05/02/2015    Procedure: Left Heart Cath and Coronary Angiography;  Surgeon: Lorretta Harp, MD;  Location: [place] CV LAB;  Service: Cardiovascular;  Laterality: N/A;  . Cardiac catheterization N/A 05/04/2015    Procedure: Left Heart Cath and Coronary Angiography;  Surgeon: Wellington Hampshire, MD;  Location: [place] CV LAB;  Service: Cardiovascular;  Laterality: N/A;    VITAL SIGNS BP 102/83 mmHg  Pulse 80  Temp(Src) 97.9 F (36.6 C) (Oral)  Resp 20  Ht 6\' 1"  (1.854 m)  Wt 339 lb (153.769 kg)  BMI 44.74 kg/m2  SpO2 97%  Patient's Medications  New Prescriptions   No medications on file  Previous Medications   ALUM & MAG HYDROXIDE-SIMETH (MAALOX/MYLANTA) 200-200-20 MG/5ML SUSPENSION    Take 30 mLs by mouth every 6 (six) hours as needed for indigestion or heartburn (dyspepsia).   ASPIRIN EC 81 MG TABLET  Take 81 mg by mouth every morning.    DIPHENOXYLATE-ATROPINE (LOMOTIL) 2.5-0.025 MG TABLET    Take 1 tablet by mouth 3 (three) times daily as needed for diarrhea or loose stools.    FEXOFENADINE (ALLEGRA) 180 MG TABLET    Take 180 mg by mouth daily as needed for allergies or rhinitis.   FUROSEMIDE (LASIX) 40 MG TABLET    Take 1 tablet (40 mg total) by mouth 2 (two) times daily.   HYDROXYZINE (ATARAX/VISTARIL) 25 MG TABLET    Take 25 mg by mouth 2 (two) times daily.   HYDROXYZINE (ATARAX/VISTARIL) 50 MG TABLET    Take 50 mg by mouth at bedtime.   INSULIN NPH HUMAN (HUMULIN N) 100 UNIT/ML INJECTION    Inject 0.28 mLs (28 Units total) into the skin 2 (two) times daily at 8 am and 10 pm.   MAGNESIUM OXIDE (MAG-OX) 400 (241.3  MG) MG TABLET    Take 1 tablet (400 mg total) by mouth daily.   METOPROLOL (LOPRESSOR) 25 MG TABLET    Take 0.5 tablets (12.5 mg total) by mouth 2 (two) times daily.   MUPIROCIN CREAM (BACTROBAN) 2 %    Apply topically 2 (two) times daily.   NITROGLYCERIN (NITROSTAT) 0.4 MG SL TABLET    Place 1 tablet (0.4 mg total) under the tongue every 5 (five) minutes as needed for chest pain.   OMEPRAZOLE (PRILOSEC) 40 MG CAPSULE    Take 1 capsule (40 mg total) by mouth daily.   OXYBUTYNIN (DITROPAN) 5 MG TABLET    Take 5 mg by mouth daily as needed for bladder spasms.    POTASSIUM CHLORIDE SA (K-DUR,KLOR-CON) 20 MEQ TABLET    Take 2 tablets (40 mEq total) by mouth daily.   ROPINIROLE (REQUIP) 0.5 MG TABLET    Take 0.5 mg by mouth at bedtime.   SENNA (SENOKOT) 8.6 MG TABLET    Take 2 tablets by mouth daily as needed for constipation.   TRAMADOL (ULTRAM) 50 MG TABLET    Take 50 mg by mouth 2 (two) times daily as needed for moderate pain.    VITAMIN C (ASCORBIC ACID) 500 MG TABLET    Take 500 mg by mouth 2 (two) times daily.  Modified Medications   No medications on file  Discontinued Medications   No medications on file     SIGNIFICANT DIAGNOSTIC EXAMS  10-12-15: chest x-ray: Enlarged cardiac silhouette. Pulmonary vascular congestion.  10-18-15: EEG: This is a normal EEG for the patients stated age.  There were no focal, hemispheric or lateralizing features.  No epileptiform activity was recorded.  A normal EEG does not exclude the diagnosis of a seizure disorder and if seizure remains high on the list of differential diagnosis, an ambulatory EEG may be of value.  Clinical correlation is required.    LABS REVIEWED:   09-10-15: hgb a1c 10.4 09-15-15; tsh 2.954 10-12-15; wbc 5.9; hgb 11.8; hct 36.1; mcv 93.5; pt 195; glucose 187; bun 8; creat 0.78; k+ 5.0; na++136 11-03-15: wbc 9.2; hgb 12.7; hct 39.7; mcv 89.3; plt 267; glucose 105; bun 17.2; creat 0.61; k+ 4.2; na++135     Review of Systems    Constitutional: Negative for malaise/fatigue.  Respiratory: Negative for cough and shortness of breath.   Cardiovascular: Negative for chest pain, palpitations and leg swelling.  Gastrointestinal: Negative for heartburn, abdominal pain and constipation.  Musculoskeletal: Negative for myalgias, back pain and joint pain.  Skin: Positive for itching  No rash has dry skin .  Neurological: Negative for dizziness.  Psychiatric/Behavioral: The patient is not nervous/anxious.     Physical Exam  Constitutional: He is oriented to person, place, and time. No distress.  Obese   Eyes: Conjunctivae are normal.  Neck: Neck supple. No JVD present. No thyromegaly present.  Cardiovascular: Normal rate, regular rhythm and intact distal pulses.   Respiratory: Effort normal and breath sounds normal. No respiratory distress. He has no wheezes.  GI: Soft. Bowel sounds are normal. He exhibits no distension. There is no tenderness.  Genitourinary:  Has foley   Musculoskeletal: He exhibits edema.  Able to move all extremities   Lymphadenopathy:    He has no cervical adenopathy.  Neurological: He is alert and oriented to person, place, and time.  Skin: Skin is warm and dry. He is not diaphoretic. He does have excessively dry skin.    Psychiatric: He has a normal mood and affect.    ASSESSMENT/PLAN  1. Gerd: will continue prilosec 40 mg daily   2. Diastolic heart failure: will continue lasix 40 mg twice daily with k+ 40 meq daily   3. Hypertension: will continue lopressor 12.5 mg twice daily   4. Neurogenic bladder: has chronic indwelling foley will continue ditropan 5 mg daily as needed for spasms  5. RLS: is stable will continue requip 0.5 mg nightly   6. Diabetes: will continue NPH 28 units twice daily hgb a1c 10.4   7. CAD: no complaint of chest pain; will continue asa 81 mg daily has ntg prn  8. Allergic rhinitis: will continue allegra 180 mg daily as needed  9. CVA: is neurologically  stable will continue asa 81 mg daily    Will check hgb a1c; lipids; urine micro-albumin     Ok Edwards NP [facility]  Contact [phone] Monday through Friday 8am- 5pm  After hours call [phone]

## 2015-12-29 DIAGNOSIS — Z79899 Other long term (current) drug therapy: Secondary | ICD-10-CM | POA: Diagnosis not present

## 2015-12-29 LAB — LIPID PANEL
CHOLESTEROL: 180 mg/dL (ref 0–200)
HDL: 52 mg/dL (ref 35–70)
LDL Cholesterol: 115 mg/dL
TRIGLYCERIDES: 66 mg/dL (ref 40–160)

## 2015-12-29 LAB — HEMOGLOBIN A1C: Hemoglobin A1C: 8.8

## 2016-01-09 LAB — HM DIABETES FOOT EXAM

## 2016-01-11 ENCOUNTER — Emergency Department (HOSPITAL_COMMUNITY): Payer: Medicare Other

## 2016-01-11 ENCOUNTER — Inpatient Hospital Stay (HOSPITAL_COMMUNITY)
Admission: EM | Admit: 2016-01-11 | Discharge: 2016-01-16 | DRG: 698 | Disposition: A | Discharge status: Skilled Nursing Facility | Payer: Medicare Other | Attending: Internal Medicine | Admitting: Internal Medicine

## 2016-01-11 ENCOUNTER — Encounter (HOSPITAL_COMMUNITY): Payer: Self-pay | Admitting: Emergency Medicine

## 2016-01-11 ENCOUNTER — Non-Acute Institutional Stay (SKILLED_NURSING_FACILITY): Payer: Medicare Other | Admitting: Adult Health

## 2016-01-11 ENCOUNTER — Encounter: Payer: Self-pay | Admitting: Adult Health

## 2016-01-11 DIAGNOSIS — Z885 Allergy status to narcotic agent status: Secondary | ICD-10-CM

## 2016-01-11 DIAGNOSIS — R402421 Glasgow coma scale score 9-12, in the field [EMT or ambulance]: Secondary | ICD-10-CM | POA: Diagnosis not present

## 2016-01-11 DIAGNOSIS — Z79899 Other long term (current) drug therapy: Secondary | ICD-10-CM

## 2016-01-11 DIAGNOSIS — R4182 Altered mental status, unspecified: Secondary | ICD-10-CM | POA: Insufficient documentation

## 2016-01-11 DIAGNOSIS — T83511D Infection and inflammatory reaction due to indwelling urethral catheter, subsequent encounter: Secondary | ICD-10-CM | POA: Diagnosis not present

## 2016-01-11 DIAGNOSIS — E875 Hyperkalemia: Secondary | ICD-10-CM | POA: Diagnosis present

## 2016-01-11 DIAGNOSIS — T83518A Infection and inflammatory reaction due to other urinary catheter, initial encounter: Principal | ICD-10-CM | POA: Diagnosis present

## 2016-01-11 DIAGNOSIS — N390 Urinary tract infection, site not specified: Secondary | ICD-10-CM | POA: Diagnosis not present

## 2016-01-11 DIAGNOSIS — N319 Neuromuscular dysfunction of bladder, unspecified: Secondary | ICD-10-CM | POA: Diagnosis present

## 2016-01-11 DIAGNOSIS — F329 Major depressive disorder, single episode, unspecified: Secondary | ICD-10-CM | POA: Diagnosis not present

## 2016-01-11 DIAGNOSIS — I2510 Atherosclerotic heart disease of native coronary artery without angina pectoris: Secondary | ICD-10-CM | POA: Diagnosis present

## 2016-01-11 DIAGNOSIS — N5089 Other specified disorders of the male genital organs: Secondary | ICD-10-CM | POA: Diagnosis not present

## 2016-01-11 DIAGNOSIS — I110 Hypertensive heart disease with heart failure: Secondary | ICD-10-CM | POA: Diagnosis not present

## 2016-01-11 DIAGNOSIS — F32A Depression, unspecified: Secondary | ICD-10-CM | POA: Diagnosis present

## 2016-01-11 DIAGNOSIS — J9811 Atelectasis: Secondary | ICD-10-CM | POA: Diagnosis not present

## 2016-01-11 DIAGNOSIS — E1151 Type 2 diabetes mellitus with diabetic peripheral angiopathy without gangrene: Secondary | ICD-10-CM | POA: Diagnosis present

## 2016-01-11 DIAGNOSIS — L8990 Pressure ulcer of unspecified site, unspecified stage: Secondary | ICD-10-CM | POA: Diagnosis present

## 2016-01-11 DIAGNOSIS — Y848 Other medical procedures as the cause of abnormal reaction of the patient, or of later complication, without mention of misadventure at the time of the procedure: Secondary | ICD-10-CM | POA: Diagnosis present

## 2016-01-11 DIAGNOSIS — N453 Epididymo-orchitis: Secondary | ICD-10-CM | POA: Diagnosis present

## 2016-01-11 DIAGNOSIS — Z9289 Personal history of other medical treatment: Secondary | ICD-10-CM

## 2016-01-11 DIAGNOSIS — G8929 Other chronic pain: Secondary | ICD-10-CM | POA: Diagnosis present

## 2016-01-11 DIAGNOSIS — I5032 Chronic diastolic (congestive) heart failure: Secondary | ICD-10-CM | POA: Diagnosis present

## 2016-01-11 DIAGNOSIS — Z794 Long term (current) use of insulin: Secondary | ICD-10-CM

## 2016-01-11 DIAGNOSIS — Z978 Presence of other specified devices: Secondary | ICD-10-CM

## 2016-01-11 DIAGNOSIS — E1143 Type 2 diabetes mellitus with diabetic autonomic (poly)neuropathy: Secondary | ICD-10-CM | POA: Diagnosis present

## 2016-01-11 DIAGNOSIS — Z7982 Long term (current) use of aspirin: Secondary | ICD-10-CM

## 2016-01-11 DIAGNOSIS — G9340 Encephalopathy, unspecified: Secondary | ICD-10-CM | POA: Diagnosis not present

## 2016-01-11 DIAGNOSIS — I639 Cerebral infarction, unspecified: Secondary | ICD-10-CM

## 2016-01-11 DIAGNOSIS — E11621 Type 2 diabetes mellitus with foot ulcer: Secondary | ICD-10-CM | POA: Diagnosis present

## 2016-01-11 DIAGNOSIS — E785 Hyperlipidemia, unspecified: Secondary | ICD-10-CM | POA: Diagnosis present

## 2016-01-11 DIAGNOSIS — E1165 Type 2 diabetes mellitus with hyperglycemia: Secondary | ICD-10-CM | POA: Diagnosis present

## 2016-01-11 DIAGNOSIS — N50819 Testicular pain, unspecified: Secondary | ICD-10-CM | POA: Diagnosis not present

## 2016-01-11 DIAGNOSIS — B961 Klebsiella pneumoniae [K. pneumoniae] as the cause of diseases classified elsewhere: Secondary | ICD-10-CM | POA: Diagnosis present

## 2016-01-11 DIAGNOSIS — K219 Gastro-esophageal reflux disease without esophagitis: Secondary | ICD-10-CM | POA: Diagnosis present

## 2016-01-11 DIAGNOSIS — A419 Sepsis, unspecified organism: Secondary | ICD-10-CM | POA: Diagnosis not present

## 2016-01-11 DIAGNOSIS — T83511A Infection and inflammatory reaction due to indwelling urethral catheter, initial encounter: Secondary | ICD-10-CM

## 2016-01-11 DIAGNOSIS — L89309 Pressure ulcer of unspecified buttock, unspecified stage: Secondary | ICD-10-CM | POA: Diagnosis present

## 2016-01-11 DIAGNOSIS — E1140 Type 2 diabetes mellitus with diabetic neuropathy, unspecified: Secondary | ICD-10-CM | POA: Diagnosis present

## 2016-01-11 DIAGNOSIS — Z960 Presence of urogenital implants: Secondary | ICD-10-CM

## 2016-01-11 DIAGNOSIS — N50811 Right testicular pain: Secondary | ICD-10-CM | POA: Diagnosis not present

## 2016-01-11 DIAGNOSIS — Z66 Do not resuscitate: Secondary | ICD-10-CM | POA: Diagnosis present

## 2016-01-11 DIAGNOSIS — Z87891 Personal history of nicotine dependence: Secondary | ICD-10-CM

## 2016-01-11 DIAGNOSIS — Z6841 Body Mass Index (BMI) 40.0 and over, adult: Secondary | ICD-10-CM

## 2016-01-11 DIAGNOSIS — IMO0002 Reserved for concepts with insufficient information to code with codable children: Secondary | ICD-10-CM | POA: Diagnosis present

## 2016-01-11 DIAGNOSIS — M25559 Pain in unspecified hip: Secondary | ICD-10-CM | POA: Diagnosis present

## 2016-01-11 DIAGNOSIS — Z8673 Personal history of transient ischemic attack (TIA), and cerebral infarction without residual deficits: Secondary | ICD-10-CM

## 2016-01-11 DIAGNOSIS — N50812 Left testicular pain: Secondary | ICD-10-CM | POA: Diagnosis not present

## 2016-01-11 DIAGNOSIS — Z96651 Presence of right artificial knee joint: Secondary | ICD-10-CM | POA: Diagnosis present

## 2016-01-11 DIAGNOSIS — Z888 Allergy status to other drugs, medicaments and biological substances status: Secondary | ICD-10-CM

## 2016-01-11 DIAGNOSIS — Z8249 Family history of ischemic heart disease and other diseases of the circulatory system: Secondary | ICD-10-CM

## 2016-01-11 DIAGNOSIS — Z86718 Personal history of other venous thrombosis and embolism: Secondary | ICD-10-CM

## 2016-01-11 DIAGNOSIS — G4733 Obstructive sleep apnea (adult) (pediatric): Secondary | ICD-10-CM | POA: Diagnosis present

## 2016-01-11 DIAGNOSIS — R55 Syncope and collapse: Secondary | ICD-10-CM

## 2016-01-11 HISTORY — DX: Depression, unspecified: F32.A

## 2016-01-11 HISTORY — DX: Major depressive disorder, single episode, unspecified: F32.9

## 2016-01-11 LAB — URINALYSIS, ROUTINE W REFLEX MICROSCOPIC
Bilirubin Urine: NEGATIVE
Glucose, UA: NEGATIVE mg/dL
Ketones, ur: NEGATIVE mg/dL
NITRITE: NEGATIVE
PH: 6 (ref 5.0–8.0)
Protein, ur: 100 mg/dL — AB
SPECIFIC GRAVITY, URINE: 1.015 (ref 1.005–1.030)

## 2016-01-11 LAB — COMPREHENSIVE METABOLIC PANEL
ALBUMIN: 2.9 g/dL — AB (ref 3.5–5.0)
ALK PHOS: 102 U/L (ref 38–126)
ALT: 13 U/L — ABNORMAL LOW (ref 17–63)
ANION GAP: 10 (ref 5–15)
AST: 17 U/L (ref 15–41)
BUN: 15 mg/dL (ref 6–20)
CALCIUM: 8.7 mg/dL — AB (ref 8.9–10.3)
CO2: 23 mmol/L (ref 22–32)
Chloride: 99 mmol/L — ABNORMAL LOW (ref 101–111)
Creatinine, Ser: 0.67 mg/dL (ref 0.61–1.24)
GFR calc non Af Amer: >60 mL/min (ref >60)
Glucose, Bld: 255 mg/dL — ABNORMAL HIGH (ref 65–99)
POTASSIUM: 4.7 mmol/L (ref 3.5–5.1)
SODIUM: 132 mmol/L — AB (ref 135–145)
TOTAL PROTEIN: 6.4 g/dL — AB (ref 6.5–8.1)
Total Bilirubin: 0.6 mg/dL (ref 0.3–1.2)

## 2016-01-11 LAB — CBC WITH DIFFERENTIAL/PLATELET
BASOS ABS: 0.1 10*3/uL (ref 0.0–0.1)
BASOS PCT: 0 %
Eosinophils Absolute: 0.5 10*3/uL (ref 0.0–0.7)
Eosinophils Relative: 3 %
HCT: 40.1 % (ref 39.0–52.0)
HEMOGLOBIN: 13.6 g/dL (ref 13.0–17.0)
LYMPHS PCT: 4 %
Lymphs Abs: 0.8 10*3/uL (ref 0.7–4.0)
MCH: 29.7 pg (ref 26.0–34.0)
MCHC: 33.9 g/dL (ref 30.0–36.0)
MCV: 87.6 fL (ref 78.0–100.0)
MONOS PCT: 8 %
Monocytes Absolute: 1.5 10*3/uL — ABNORMAL HIGH (ref 0.1–1.0)
NEUTROS ABS: 16.8 10*3/uL — AB (ref 1.7–7.7)
NEUTROS PCT: 85 %
Platelets: 232 10*3/uL (ref 150–400)
RBC: 4.58 MIL/uL (ref 4.22–5.81)
RDW: 12.9 % (ref 11.5–15.5)
WBC: 19.6 10*3/uL — ABNORMAL HIGH (ref 4.0–10.5)

## 2016-01-11 LAB — I-STAT TROPONIN, ED: Troponin i, poc: 0 ng/mL (ref 0.00–0.08)

## 2016-01-11 LAB — I-STAT CG4 LACTIC ACID, ED
LACTIC ACID, VENOUS: 1.26 mmol/L (ref 0.5–2.0)
Lactic Acid, Venous: 1.19 mmol/L (ref 0.5–2.0)

## 2016-01-11 LAB — URINE MICROSCOPIC-ADD ON: SQUAMOUS EPITHELIAL / LPF: NONE SEEN

## 2016-01-11 LAB — GLUCOSE, CAPILLARY: GLUCOSE-CAPILLARY: 287 mg/dL — AB (ref 65–99)

## 2016-01-11 MED ORDER — LEVOFLOXACIN IN D5W 750 MG/150ML IV SOLN: 750.0000 mg | INTRAVENOUS | Status: DC

## 2016-01-11 MED ORDER — VANCOMYCIN HCL 10 G IV SOLR
2000.0000 mg | INTRAVENOUS | Status: AC
  Administered 2016-01-12: 2000 mg via INTRAVENOUS
  Filled 2016-01-11: qty 2000

## 2016-01-11 MED ORDER — LEVOFLOXACIN IN D5W 750 MG/150ML IV SOLN
750.0000 mg | Freq: Once | INTRAVENOUS | Status: AC
  Administered 2016-01-11: 750 mg via INTRAVENOUS
  Filled 2016-01-11: qty 150

## 2016-01-11 MED ORDER — DEXTROSE 5 % IV SOLN
2.0000 g | Freq: Once | INTRAVENOUS | Status: AC
  Administered 2016-01-11: 2 g via INTRAVENOUS
  Filled 2016-01-11: qty 2

## 2016-01-11 MED ORDER — ASPIRIN 300 MG RE SUPP
150.0000 mg | Freq: Every day | RECTAL | Status: DC
  Administered 2016-01-11 – 2016-01-12 (×2): 150 mg via RECTAL
  Filled 2016-01-11: qty 1

## 2016-01-11 MED ORDER — VANCOMYCIN HCL 10 G IV SOLR
1500.0000 mg | Freq: Two times a day (BID) | INTRAVENOUS | Status: DC
  Administered 2016-01-12: 1500 mg via INTRAVENOUS
  Filled 2016-01-11: qty 1500

## 2016-01-11 MED ORDER — NALOXONE HCL 0.4 MG/ML IJ SOLN
INTRAMUSCULAR | Status: AC
  Filled 2016-01-11: qty 1

## 2016-01-11 MED ORDER — INSULIN ASPART 100 UNIT/ML ~~LOC~~ SOLN
0.0000 [IU] | Freq: Three times a day (TID) | SUBCUTANEOUS | Status: DC
  Administered 2016-01-12 (×2): 2 [IU] via SUBCUTANEOUS
  Administered 2016-01-12 – 2016-01-13 (×4): 3 [IU] via SUBCUTANEOUS
  Administered 2016-01-14: 1 [IU] via SUBCUTANEOUS
  Administered 2016-01-14: 7 [IU] via SUBCUTANEOUS
  Administered 2016-01-15: 2 [IU] via SUBCUTANEOUS
  Administered 2016-01-15 (×2): 1 [IU] via SUBCUTANEOUS

## 2016-01-11 MED ORDER — VANCOMYCIN HCL 10 G IV SOLR
1500.0000 mg | Freq: Two times a day (BID) | INTRAVENOUS | Status: DC
  Filled 2016-01-11 (×2): qty 1500

## 2016-01-11 MED ORDER — ONDANSETRON HCL 4 MG/2ML IJ SOLN
4.0000 mg | Freq: Three times a day (TID) | INTRAMUSCULAR | Status: DC | PRN
  Administered 2016-01-14: 4 mg via INTRAVENOUS
  Filled 2016-01-11: qty 2

## 2016-01-11 MED ORDER — INSULIN NPH (HUMAN) (ISOPHANE) 100 UNIT/ML ~~LOC~~ SUSP
20.0000 [IU] | Freq: Two times a day (BID) | SUBCUTANEOUS | Status: DC
  Administered 2016-01-11 – 2016-01-13 (×4): 20 [IU] via SUBCUTANEOUS
  Filled 2016-01-11: qty 10

## 2016-01-11 MED ORDER — SODIUM CHLORIDE 0.9 % IV BOLUS (SEPSIS)
1000.0000 mL | Freq: Once | INTRAVENOUS | Status: AC
  Administered 2016-01-11: 1000 mL via INTRAVENOUS

## 2016-01-11 MED ORDER — SODIUM CHLORIDE 0.9% FLUSH
3.0000 mL | Freq: Two times a day (BID) | INTRAVENOUS | Status: DC
Start: 2016-01-11 — End: 2016-01-16
  Administered 2016-01-11 – 2016-01-16 (×9): 3 mL via INTRAVENOUS

## 2016-01-11 MED ORDER — MUPIROCIN CALCIUM 2 % EX CREA
TOPICAL_CREAM | Freq: Two times a day (BID) | CUTANEOUS | Status: DC
  Administered 2016-01-11: 1 via TOPICAL
  Administered 2016-01-12 – 2016-01-16 (×9): via TOPICAL
  Filled 2016-01-11: qty 15

## 2016-01-11 MED ORDER — DEXTROSE 5 % IV SOLN
1.0000 g | Freq: Three times a day (TID) | INTRAVENOUS | Status: DC
  Administered 2016-01-11 – 2016-01-14 (×8): 1 g via INTRAVENOUS
  Filled 2016-01-11 (×10): qty 1

## 2016-01-11 MED ORDER — NALOXONE HCL 0.4 MG/ML IJ SOLN
0.4000 mg | Freq: Once | INTRAMUSCULAR | Status: AC
  Administered 2016-01-11: 0.4 mg via INTRAVENOUS

## 2016-01-11 MED ORDER — SODIUM CHLORIDE 0.9 % IV SOLN
INTRAVENOUS | Status: AC
Start: 2016-01-11 — End: 2016-01-12
  Administered 2016-01-11: 23:00:00 via INTRAVENOUS

## 2016-01-11 MED ORDER — ACETAMINOPHEN 325 MG PO TABS
650.0000 mg | ORAL_TABLET | Freq: Four times a day (QID) | ORAL | Status: DC | PRN
  Administered 2016-01-12: 650 mg via ORAL
  Filled 2016-01-11: qty 2

## 2016-01-11 MED ORDER — ACETAMINOPHEN 650 MG RE SUPP
650.0000 mg | Freq: Four times a day (QID) | RECTAL | Status: DC | PRN
  Administered 2016-01-11: 650 mg via RECTAL
  Filled 2016-01-11: qty 1

## 2016-01-11 MED ORDER — METOPROLOL TARTRATE 5 MG/5ML IV SOLN
2.5000 mg | Freq: Three times a day (TID) | INTRAVENOUS | Status: DC
  Administered 2016-01-11 – 2016-01-12 (×2): 2.5 mg via INTRAVENOUS
  Filled 2016-01-11 (×2): qty 5

## 2016-01-11 MED ORDER — ENOXAPARIN SODIUM 80 MG/0.8ML ~~LOC~~ SOLN
80.0000 mg | SUBCUTANEOUS | Status: DC
  Administered 2016-01-11 – 2016-01-15 (×5): 80 mg via SUBCUTANEOUS
  Filled 2016-01-11 (×5): qty 0.8

## 2016-01-11 MED ORDER — PANTOPRAZOLE SODIUM 40 MG IV SOLR
40.0000 mg | INTRAVENOUS | Status: DC
  Administered 2016-01-11 – 2016-01-12 (×3): 40 mg via INTRAVENOUS
  Filled 2016-01-11 (×2): qty 40

## 2016-01-11 NOTE — Progress Notes (Signed)
Pharmacy Antibiotic Note  [name] is a 70 y.o. male admitted on 01/11/2016 with UTI due to chronic indwelling cath. Pharmacy consulted earlier for aztreonam and levofloxacin dosing. Now MD changing to Vancomycin and Aztreonam.  Noted pt grew pseudomonas (sensitive to cefepime/ceftazidime; resistant to cipro/gent/imipenem) and MRSA in urine cx 10/09/15. Pt with allergy to cephalosporins. Given fosfomycin x 1 back in January for pseudomonas and nitrofurantoin for MRSA.  Pt not ambulatory so SCr likely not reflective of renal function.  Plan: Continue Aztreonam 1g q8h Vancomycin 2gm IV now then 1500mg  IV q12h Will f/u VT at C[facility] in obese pt Will f/u UOP, SCr trend, micro data, and pt's clinical condition  Height: 6\' 3"  (190.5 cm) Weight: (!) 341 lb 11.4 oz (155 kg) IBW/kg (Calculated) : 84.5  Temp (24hrs), Avg:99.5 F (37.5 C), Min:99.2 F (37.3 C), Max:99.7 F (37.6 C)   Recent Labs Lab 01/11/16 1442 01/11/16 1451 01/11/16 1724  WBC 19.6*  --   --   CREATININE 0.67  --   --   LATICACIDVEN  --  1.26 1.19    Estimated Creatinine Clearance: 140.9 mL/min (by C-G formula based on Cr of 0.67).    Allergies  Allergen Reactions  . Ace Inhibitors Swelling    Pt tolerates lisinopril  . Lipitor [Atorvastatin Calcium] Swelling  . Metformin And Related Swelling  . Cefadroxil Hives  . Cephalexin Hives  . Morphine And Related Other (See Comments)    Sweating, feels like is "in rocky boat."  . Robaxin [Methocarbamol] Other (See Comments)    Feels like he is shaky    Antimicrobials this admission: Aztreonam 5/3>> Vanc 5/4>> Levofloxacin 5/3 x1  Microbiology results: 5/3 Blood x 2 5/3 Urine  Sherlon Handing, PharmD, BCPS Clinical pharmacist, pager [phone] 01/11/2016 11:39 PM

## 2016-01-11 NOTE — ED Notes (Signed)
Pt to ER via [facility] from [facility], [place] where patient is normally non-ambulatory but alert and oriented x4 and able to hold a conversation. Pt LSN at 1130 this morning, found by staff later with minimal responsiveness. Pt per EMS is not verbalizing or answering questions but is following commands. Pt is tachycardic at 120, otherwise VS stable. Pt very lethargic on arrival. MD at bedside.

## 2016-01-11 NOTE — H&P (Signed)
History and Physical    [name] [id number] DOB: 06-07-1946 DOA: 01/11/2016  Referring MD/NP/PA:   PCP: Brian Shelling, MD   Outpatient Specialists: none  Patient coming from:  SNF   Chief Complaint: AMS and testicular pain  HPI: [name] is a 70 y.o. male with medical history significant of stroke, normally not ambulatory, hypertension, hyperlipidemia, diabetes mellitus, GERD, depression, chronic indwelling Foley catheter, CAD, diastolic congestive heart failure, DVT not on anticoagulants, OSA not on CPAP, chronic back pain, who presents with altered mental status and testicular pain.  Patient has AMS and is unable to provide medical history, therefore, most of the history is obtained by discussing the case with ED physician, per EMS report, and with the nursing staff. EDP attempted to call his SNF and was unable to get anyone to pick up the phone.  It seems that the patient was last seen normal around 11:30 AM and found to be altered and minimally responsive later of the day. Normally he is alert and oriented. He is nonambulatory. He has indwelling catheter with discharge from meatus and tenderness in the testicles on examination. Pt does not seem to have active cough, nausea, vomiting. No diarrhea noted.  ED Course: pt was found to have WBC 19.6, temperature 99.7, tachycardia, tachypnea, oxygen saturation 93-98%, electrolytes and renal function okay, lactate 1.26 1.19, positive urinalysis with moderate amount of leukocytes, negative troponin, CT head is negative for acute abnormalities. Ultrasound of scrotum showed left epididymo-orchitis; microlithiasis involving the left testicle; three adjacent complex cysts/spermatoceles involving the left epididymal head.  Review of Systems: Could not be reviewed due to altered mental status.  Travel history: No recent long distant travel.  Allergy:  Allergies  Allergen Reactions  . Ace Inhibitors Swelling    Pt tolerates  lisinopril  . Lipitor [Atorvastatin Calcium] Swelling  . Metformin And Related Swelling  . Cefadroxil Hives  . Cephalexin Hives  . Morphine And Related Other (See Comments)    Sweating, feels like is "in rocky boat."  . Robaxin [Methocarbamol] Other (See Comments)    Feels like he is shaky    Past Medical History  Diagnosis Date  . Obese   . Hypertension   . Diabetic neuropathy, painful ([place])   . Chronic indwelling Foley catheter   . Bladder spasms   . Hyperlipidemia   . GERD (gastroesophageal reflux disease)   . Neurogenic bladder   . CAD (coronary artery disease)   . PONV (postoperative nausea and vomiting)   . CHF (congestive heart failure) ([place])   . DVT (deep venous thrombosis) (HCC) 1980's    LLE  . Type II diabetes mellitus ([place])   . Diabetic diarrhea ([place])   . Pneumonia 1999  . OSA (obstructive sleep apnea)     "they wanted me to wear a mask; I couldn't" (11/10/2014)  . Arthritis     "hands, ankles, knees" (11/10/2014)  . Chronic lower back pain   . Stroke [facility]) 10/2011; 06/2014    right hand numbness/notes 10/20/2011, pt not sure he had stroke in 2013; "right hand weaker and right face not quite right since" (11/10/2014)  . Depression     Past Surgical History  Procedure Laterality Date  . Gastroplasty    . Total knee arthroplasty Right 1990's  . Left heart catheterization with coronary angiogram N/A 10/24/2011    Procedure: LEFT HEART CATHETERIZATION WITH CORONARY ANGIOGRAM;  Surgeon: Candee Furbish, MD;  Location: [facility] CATH LAB;  Service: Cardiovascular;  Laterality: N/A;  right radial artery approach  . Cholecystectomy open  1970's?  . Joint replacement    . Carpal tunnel release Bilateral ~ 2003-2004  . Cardiac catheterization N/A 05/02/2015    Procedure: Left Heart Cath and Coronary Angiography;  Surgeon: Lorretta Harp, MD;  Location: [place] CV LAB;  Service: Cardiovascular;  Laterality: N/A;  . Cardiac catheterization N/A 05/04/2015    Procedure: Left Heart  Cath and Coronary Angiography;  Surgeon: Wellington Hampshire, MD;  Location: [place] CV LAB;  Service: Cardiovascular;  Laterality: N/A;    Social History:  reports that he quit smoking about 39 years ago. His smoking use included Cigarettes. He has a 40 pack-year smoking history. He has never used smokeless tobacco. He reports that he does not drink alcohol or use illicit drugs.  Family History:  Family History  Problem Relation Age of Onset  . Hypertension Mother   . Diabetes type I Father   . Cancer Brother      Testicular Cancer  . Cancer Brother     Leukemia     Prior to Admission medications   Medication Sig Start Date End Date Taking? Authorizing Provider  alum & mag hydroxide-simeth (MAALOX/MYLANTA) 200-200-20 MG/5ML suspension Take 30 mLs by mouth every 6 (six) hours as needed for indigestion or heartburn (dyspepsia). 10/13/15  Yes Lavina Hamman, MD  Amino Acids-Protein Hydrolys (PRO-STAT) LIQD Take 1 Can by mouth 3 (three) times daily.   Yes Historical Provider, MD  aspirin EC 81 MG tablet Take 81 mg by mouth every morning.    Yes Historical Provider, MD  diphenoxylate-atropine (LOMOTIL) 2.5-0.025 MG tablet Take 1 tablet by mouth every 8 (eight) hours as needed for diarrhea or loose stools.  06/14/15  Yes Historical Provider, MD  escitalopram (LEXAPRO) 10 MG tablet Take 10 mg by mouth every morning.   Yes Historical Provider, MD  fexofenadine (ALLEGRA) 180 MG tablet Take 180 mg by mouth daily as needed for allergies or rhinitis.   Yes Historical Provider, MD  furosemide (LASIX) 40 MG tablet Take 1 tablet (40 mg total) by mouth 2 (two) times daily. 10/13/15  Yes Lavina Hamman, MD  hydrOXYzine (ATARAX/VISTARIL) 25 MG tablet Take 25 mg by mouth 2 (two) times daily.   Yes Historical Provider, MD  hydrOXYzine (ATARAX/VISTARIL) 50 MG tablet Take 50 mg by mouth at bedtime.   Yes Historical Provider, MD  insulin NPH Human (HUMULIN N) 100 UNIT/ML injection Inject 0.28 mLs (28 Units total)  into the skin 2 (two) times daily at 8 am and 10 pm. Patient taking differently: Inject 28 Units into the skin 2 (two) times daily.  09/12/15  Yes Nishant Dhungel, MD  magnesium oxide (MAG-OX) 400 (241.3 Mg) MG tablet Take 1 tablet (400 mg total) by mouth daily. 09/15/15  Yes Simbiso Ranga, MD  metoprolol (LOPRESSOR) 25 MG tablet Take 0.5 tablets (12.5 mg total) by mouth 2 (two) times daily. 12/15/15  Yes Jerline Pain, MD  mupirocin cream (BACTROBAN) 2 % Apply topically 2 (two) times daily. 09/25/15  Yes [name], MD  nitroGLYCERIN (NITROSTAT) 0.4 MG SL tablet Place 1 tablet (0.4 mg total) under the tongue every 5 (five) minutes as needed for chest pain. 12/08/14  Yes Luke K Kilroy, PA-C  omeprazole (PRILOSEC) 40 MG capsule Take 1 capsule (40 mg total) by mouth daily. 10/13/15  Yes Lavina Hamman, MD  potassium chloride (KLOR-CON) 20 MEQ packet Take 40 mEq by mouth every morning.   Yes Historical Provider,  MD  rOPINIRole (REQUIP) 0.5 MG tablet Take 0.5 mg by mouth at bedtime.   Yes Historical Provider, MD  senna-docusate (SENOKOT-S) 8.6-50 MG tablet Take 2 tablets by mouth at bedtime.   Yes Historical Provider, MD  vitamin C (ASCORBIC ACID) 500 MG tablet Take 500 mg by mouth 2 (two) times daily.   Yes Historical Provider, MD  potassium chloride SA (K-DUR,KLOR-CON) 20 MEQ tablet Take 2 tablets (40 mEq total) by mouth daily. 10/13/15   Lavina Hamman, MD    Physical Exam: Filed Vitals:   01/11/16 1915 01/11/16 2000 01/11/16 2104 01/11/16 2106  BP: 133/69 142/67  137/67  Pulse: 109 111  103  Temp:   99.2 F (37.3 C)   TempSrc:   Oral   Resp: 13 18  24   Height:   6\' 3"  (1.905 m)   Weight:   155 kg (341 lb 11.4 oz)   SpO2: 97% 97%  95%   General: Not in acute distress HEENT:       Eyes: PERRL, EOMI, no scleral icterus.       ENT: No discharge from the ears and nose, no pharynx injection, no tonsillar enlargement.        Neck: No JVD, no bruit, no mass felt. Heme: No neck lymph node  enlargement. Cardiac: S1/S2, RRR, No murmurs, No gallops or rubs. Pulm:  No rales, wheezing, rhonchi or rubs. Abd: Soft, nondistended, nontender, no rebound pain, no organomegaly, BS present. GU: Indwelling catheter with discharge from meatus. Tenderness in the testicles. Ext: No pitting leg edema bilaterally. 2+DP/PT pulse bilaterally. Musculoskeletal: No joint deformities, No joint redness or warmth, no limitation of ROM in spin. Skin: has pressure ulcers stage II in sacral area. Neuro: not oriented X3, cranial nerves II-XII grossly intact, moves arm, but not legs. Psych: Patient is not psychotic, no suicidal or hemocidal ideation.  Labs on Admission: I have personally reviewed following labs and imaging studies  CBC:  Recent Labs Lab 01/11/16 1442  WBC 19.6*  NEUTROABS 16.8*  HGB 13.6  HCT 40.1  MCV 87.6  PLT [id number]   Basic Metabolic Panel:  Recent Labs Lab 01/11/16 1442  NA 132*  K 4.7  CL 99*  CO2 23  GLUCOSE 255*  BUN 15  CREATININE 0.67  CALCIUM 8.7*   GFR: Estimated Creatinine Clearance: 140.9 mL/min (by C-G formula based on Cr of 0.67). Liver Function Tests:  Recent Labs Lab 01/11/16 1442  AST 17  ALT 13*  ALKPHOS 102  BILITOT 0.6  PROT 6.4*  ALBUMIN 2.9*   No results for input(s): LIPASE, AMYLASE in the last 168 hours. No results for input(s): AMMONIA in the last 168 hours. Coagulation Profile: No results for input(s): INR, PROTIME in the last 168 hours. Cardiac Enzymes: No results for input(s): CKTOTAL, CKMB, CKMBINDEX, TROPONINI in the last 168 hours. BNP (last 3 results) No results for input(s): PROBNP in the last 8760 hours. HbA1C: No results for input(s): HGBA1C in the last 72 hours. CBG:  Recent Labs Lab 01/11/16 2109  GLUCAP 287*   Lipid Profile: No results for input(s): CHOL, HDL, LDLCALC, TRIG, CHOLHDL, LDLDIRECT in the last 72 hours. Thyroid Function Tests: No results for input(s): TSH, T4TOTAL, FREET4, T3FREE, THYROIDAB in  the last 72 hours. Anemia Panel: No results for input(s): VITAMINB12, FOLATE, FERRITIN, TIBC, IRON, RETICCTPCT in the last 72 hours. Urine analysis:    Component Value Date/Time   COLORURINE YELLOW 01/11/2016 1455   APPEARANCEUR CLOUDY* 01/11/2016 1455   LABSPEC  1.015 01/11/2016 1455   PHURINE 6.0 01/11/2016 1455   GLUCOSEU NEGATIVE 01/11/2016 1455   HGBUR MODERATE* 01/11/2016 1455   BILIRUBINUR NEGATIVE 01/11/2016 1455   KETONESUR NEGATIVE 01/11/2016 1455   PROTEINUR 100* 01/11/2016 1455   UROBILINOGEN 1.0 07/01/2015 1455   NITRITE NEGATIVE 01/11/2016 1455   LEUKOCYTESUR MODERATE* 01/11/2016 1455   Sepsis Labs: @LABRCNTIP (procalcitonin:4,lacticidven:4) )No results found for this or any previous visit (from the past 240 hour(s)).   Radiological Exams on Admission: Ct Head Wo Contrast  01/11/2016  CLINICAL DATA:  Altered mental status, dysphagia EXAM: CT HEAD WITHOUT CONTRAST TECHNIQUE: Contiguous axial images were obtained from the base of the skull through the vertex without intravenous contrast. COMPARISON:  09/22/2015 FINDINGS: Brain: No intracranial hemorrhage, mass effect or midline shift. Mild cerebral atrophy again noted. Bilateral basal ganglia punctate calcifications are again noted. Stable mild periventricular chronic white matter disease. No acute cortical infarction. No mass lesion is noted on this unenhanced scan. Vascular: Mild atherosclerotic calcifications of carotid siphon again noted. Skull: No skull fracture is noted. Sinuses/Orbits: Paranasal sinuses and mastoid air cells are unremarkable. Other: None IMPRESSION: No acute intracranial abnormality. Stable mild cerebral atrophy and chronic white matter disease. No definite acute cortical infarction. Electronically Signed   By: Lahoma Crocker M.D.   On: 01/11/2016 16:53   US Scrotum  01/11/2016  CLINICAL DATA:  70 year old presenting with bilateral testicular pain and tenderness. EXAM: SCROTAL ULTRASOUND DOPPLER ULTRASOUND OF  THE TESTICLES TECHNIQUE: Complete ultrasound examination of the testicles, epididymis, and other scrotal structures was performed. Color and spectral Doppler ultrasound were also utilized to evaluate blood flow to the testicles. COMPARISON:  None. FINDINGS: Right testicle Measurements: Approximately 4.7 x 2.3 x 3.6 cm. Normal parenchymal echotexture without mass or microlithiasis. Normal color Doppler flow without evidence of hyperemia. Left testicle Measurements: Approximately 5.1 x 2.7 x 3.2 cm. Normal parenchymal echotexture without evidence of mass. Microcalcifications throughout the left testicle. Hyperemia on color Doppler evaluation. Right epididymis: Normal in size and appearance without evidence of hyperemia. Left epididymis: 3 adjacent complex cysts/spermatoceles measuring approximately 5 mm, 10 mm and 8 mm respectively involving the epididymal head. Hyperemia on color Doppler evaluation involving the entire epididymis. Hydrocele:  Absent bilaterally. Varicocele:  Absent bilaterally. Pulsed Doppler interrogation of both testes demonstrates normal low resistance arterial and venous waveforms bilaterally. IMPRESSION: 1. Left epididymo-orchitis. 2. Microlithiasis involving the left testicle. 3. Three adjacent complex cysts/spermatoceles involving the left epididymal head. Electronically Signed   By: Evangeline Dakin M.D.   On: 01/11/2016 18:38   [place] Art/ven Flow Abd Pelv Doppler  01/11/2016  CLINICAL DATA:  70 year old presenting with bilateral testicular pain and tenderness. EXAM: SCROTAL ULTRASOUND DOPPLER ULTRASOUND OF THE TESTICLES TECHNIQUE: Complete ultrasound examination of the testicles, epididymis, and other scrotal structures was performed. Color and spectral Doppler ultrasound were also utilized to evaluate blood flow to the testicles. COMPARISON:  None. FINDINGS: Right testicle Measurements: Approximately 4.7 x 2.3 x 3.6 cm. Normal parenchymal echotexture without mass or microlithiasis. Normal  color Doppler flow without evidence of hyperemia. Left testicle Measurements: Approximately 5.1 x 2.7 x 3.2 cm. Normal parenchymal echotexture without evidence of mass. Microcalcifications throughout the left testicle. Hyperemia on color Doppler evaluation. Right epididymis: Normal in size and appearance without evidence of hyperemia. Left epididymis: 3 adjacent complex cysts/spermatoceles measuring approximately 5 mm, 10 mm and 8 mm respectively involving the epididymal head. Hyperemia on color Doppler evaluation involving the entire epididymis. Hydrocele:  Absent bilaterally. Varicocele:  Absent bilaterally. Pulsed Doppler interrogation  of both testes demonstrates normal low resistance arterial and venous waveforms bilaterally. IMPRESSION: 1. Left epididymo-orchitis. 2. Microlithiasis involving the left testicle. 3. Three adjacent complex cysts/spermatoceles involving the left epididymal head. Electronically Signed   By: Evangeline Dakin M.D.   On: 01/11/2016 18:38   Dg Chest Port 1 View  01/11/2016  CLINICAL DATA:  Sepsis.  Altered mental status. EXAM: PORTABLE CHEST 1 VIEW COMPARISON:  10/08/2015 and 10/12/2015 FINDINGS: The heart size and pulmonary vascularity are normal and the lungs are clear except for minimal atelectasis at the left base posterior medially. No bone abnormality. IMPRESSION: Minimal left base atelectasis.  Otherwise, normal exam. Electronically Signed   By: Lorriane Shire M.D.   On: 01/11/2016 14:54     EKG: Not done in ED, will get one.   Assessment/Plan Principal Problem:   UTI (urinary tract infection) due to urinary indwelling catheter (HCC) Active Problems:   Diabetic neuropathy (HCC)   CVA (cerebral infarction)   Chronic indwelling Foley catheter   Chronic diastolic CHF (congestive heart failure) (HCC)   History of DVT (deep vein thrombosis)   Pressure ulcer   Hyperlipidemia LDL goal <70   Type II diabetes mellitus with peripheral circulatory disorder, uncontrolled  (HCC)   CAD in native artery   GERD (gastroesophageal reflux disease)   Acute encephalopathy   Sepsis ([place])   Depression   UTI (lower urinary tract infection)   Epididymo-orchitis   Sepsis due to Epididymo-orchitis and UTI due to urinary indwelling catheter [facility]): patient meets criteria for sepsis with leukocytosis, tachycardia, tachypnea. His lactate is normal 1.26-->1.19. Currently hemodynamically stable. Pt has hx of positive urine culture with Pseudomonas ( resistant to Cipro) - will admit to tele bed for obs - Empiric antimicrobial treatment with vancomycin. EDP started Levaquin-->will switch to aztreonam (this was discussed with the pharmacist) - PRN Zofran for nausea - Blood cultures x 2 and Ux - will get Procalcitonin and trend lactic acid levels per sepsis protocol. - IVF: 2.0 L of NS bolus in ED, followed by 100cc/h  Acute encephalopathy: Likely due to sepsis and UTI. -Treat underlying infection as above -Frequent neuro check -Hold oral medications  Stroke: pt is nonambulatory.  -switch oral ASA to per rectal ASA  Chronic diastolic CHF (congestive heart failure) ([place]): 2-D echo on 07/04/15 showed EF of 55-60 percent with grade 1 diastolic dysfunction. Patient does not have leg edema. No JVD. CHF is compensated on admission. -Hold Lasix due to sepsis. -check BNP  Pressure ulcer: -WC consult  DM-II: Last A1c 10.4 on 09/10/15, poorly controled. Patient is taking NPH humulin at home -will decrease NPH Humulin dose from 28 to 20 units bid -SSI  HTN: -switch oral metoprolol to IV with holding parameter -hold lasix as above  GERD: -Protonix IV  Depression: -Hold oral meds now  CAD: trop negative x 1.  -continue ASA as above. -f/u EKG  DVT ppx: SQ Lovenox Code Status: DNR (MOST form indicated that pt is DNR) Family Communication: None at bed side. Disposition Plan:  Anticipate discharge back to previous home environment Consults called:  none Admission  status: Obs/tele       Date of Service 01/11/2016    Ivor Costa [facility] Pager [phone]  If 7PM-7AM, please contact night-coverage [email or web address] Password [email or web address] 01/11/2016, 11:01 PM

## 2016-01-11 NOTE — ED Provider Notes (Signed)
On recheck the patient has VS that are consistent, w tachycardia, remains somnolent. W e/o UTI, ongoing changes in mental status, he will be admitted for further E/M.   Carmin Muskrat, MD 01/11/16 [phone]

## 2016-01-11 NOTE — Progress Notes (Addendum)
Pharmacy Antibiotic Note  [name] is a 70 y.o. male admitted on 01/11/2016 with UTI.  Pharmacy has been consulted for aztreonam and levofloxacin dosing.  Plan: Levofloxacin 750 q24h Aztreonam 1g q8h Monitor renal fx, cultures, ability to de-escalate    Temp (24hrs), Avg:99.7 F (37.6 C), Min:99.7 F (37.6 C), Max:99.7 F (37.6 C)   Recent Labs Lab 01/11/16 1442 01/11/16 1451 01/11/16 1724  WBC 19.6*  --   --   CREATININE 0.67  --   --   LATICACIDVEN  --  1.26 1.19    Estimated Creatinine Clearance: 137.8 mL/min (by C-G formula based on Cr of 0.67).    Allergies  Allergen Reactions  . Ace Inhibitors Swelling    Pt tolerates lisinopril  . Lipitor [Atorvastatin Calcium] Swelling  . Metformin And Related Swelling  . Cefadroxil Hives  . Cephalexin Hives  . Morphine And Related Other (See Comments)    Sweating, feels like is "in rocky boat."  . Robaxin [Methocarbamol] Other (See Comments)    Feels like he is shaky    Antimicrobials this admission: Aztreonam 5/3>> Levofloxacin 5/3>>  Microbiology results: 5/3 Blood x 2 5/3 Urine  Levester Fresh, PharmD, BCPS, [facility] Clinical Pharmacist Pager [phone] 01/11/2016 7:08 PM

## 2016-01-11 NOTE — ED Provider Notes (Signed)
CSN: [id number]     Arrival date & time 01/11/16  1410 History   First MD Initiated Contact with Patient 01/11/16 1430     Chief Complaint  Patient presents with  . Altered Mental Status     (Consider location/radiation/quality/duration/timing/severity/associated sxs/prior Treatment) HPI  [name] This is a 70 year old male brought in by EMS for altered mental status. There is a level V caveat the patient is unresponsive. The patient was last seen normal around 11:30 AM. Normally he is alert, oriented. He is nonambulatory. He has a past medical history of morbid obesity, diabetes, chronic indwelling Foley catheter, history of CHF and DVT. History of OSA, and history of previous CVA. EMS reports that the patient was found to be minimally responsive. I attempted to call his SNF and was unable to get anyone to pick up the phone.  Past Medical History  Diagnosis Date  . Obese   . Hypertension   . Diabetic neuropathy, painful ([place])   . Chronic indwelling Foley catheter   . Bladder spasms   . Hyperlipidemia   . GERD (gastroesophageal reflux disease)   . Neurogenic bladder   . CAD (coronary artery disease)   . PONV (postoperative nausea and vomiting)   . CHF (congestive heart failure) ([place])   . DVT (deep venous thrombosis) (HCC) 1980's    LLE  . Type II diabetes mellitus ([place])   . Diabetic diarrhea ([place])   . Pneumonia 1999  . OSA (obstructive sleep apnea)     "they wanted me to wear a mask; I couldn't" (11/10/2014)  . Arthritis     "hands, ankles, knees" (11/10/2014)  . Chronic lower back pain   . Stroke [facility]) 10/2011; 06/2014    right hand numbness/notes 10/20/2011, pt not sure he had stroke in 2013; "right hand weaker and right face not quite right since" (11/10/2014)   Past Surgical History  Procedure Laterality Date  . Gastroplasty    . Total knee arthroplasty Right 1990's  . Left heart catheterization with coronary angiogram N/A 10/24/2011    Procedure: LEFT HEART  CATHETERIZATION WITH CORONARY ANGIOGRAM;  Surgeon: Candee Furbish, MD;  Location: [facility] CATH LAB;  Service: Cardiovascular;  Laterality: N/A;  right radial artery approach  . Cholecystectomy open  1970's?  . Joint replacement    . Carpal tunnel release Bilateral ~ 2003-2004  . Cardiac catheterization N/A 05/02/2015    Procedure: Left Heart Cath and Coronary Angiography;  Surgeon: Lorretta Harp, MD;  Location: [place] CV LAB;  Service: Cardiovascular;  Laterality: N/A;  . Cardiac catheterization N/A 05/04/2015    Procedure: Left Heart Cath and Coronary Angiography;  Surgeon: Wellington Hampshire, MD;  Location: [place] CV LAB;  Service: Cardiovascular;  Laterality: N/A;   Family History  Problem Relation Age of Onset  . Hypertension Mother   . Diabetes type I Father   . Cancer Brother      Testicular Cancer  . Cancer Brother     Leukemia   Social History  Substance Use Topics  . Smoking status: Former Smoker -- 2.00 packs/day for 20 years    Types: Cigarettes    Quit date: 09/10/1976  . Smokeless tobacco: Never Used  . Alcohol Use: No     Comment: FORMER ALCOHOLIC; "sober since [id number]"    Review of Systems  Unable to review systems.   Allergies  Ace inhibitors; Lipitor; Metformin and related; Cefadroxil; Cephalexin; Morphine and related; and Robaxin  Home Medications   Prior to  Admission medications   Medication Sig Start Date End Date Taking? Authorizing Provider  alum & mag hydroxide-simeth (MAALOX/MYLANTA) 200-200-20 MG/5ML suspension Take 30 mLs by mouth every 6 (six) hours as needed for indigestion or heartburn (dyspepsia). 10/13/15   Lavina Hamman, MD  aspirin EC 81 MG tablet Take 81 mg by mouth every morning.     Historical Provider, MD  diphenoxylate-atropine (LOMOTIL) 2.5-0.025 MG tablet Take 1 tablet by mouth 3 (three) times daily as needed for diarrhea or loose stools.  06/14/15   Historical Provider, MD  fexofenadine (ALLEGRA) 180 MG tablet Take 180 mg by mouth  daily as needed for allergies or rhinitis.    Historical Provider, MD  furosemide (LASIX) 40 MG tablet Take 1 tablet (40 mg total) by mouth 2 (two) times daily. 10/13/15   Lavina Hamman, MD  hydrOXYzine (ATARAX/VISTARIL) 25 MG tablet Take 25 mg by mouth 2 (two) times daily.    Historical Provider, MD  hydrOXYzine (ATARAX/VISTARIL) 50 MG tablet Take 50 mg by mouth at bedtime.    Historical Provider, MD  insulin NPH Human (HUMULIN N) 100 UNIT/ML injection Inject 0.28 mLs (28 Units total) into the skin 2 (two) times daily at 8 am and 10 pm. 09/12/15   Nishant Dhungel, MD  magnesium oxide (MAG-OX) 400 (241.3 Mg) MG tablet Take 1 tablet (400 mg total) by mouth daily. 09/15/15   Simbiso Ranga, MD  metoprolol (LOPRESSOR) 25 MG tablet Take 0.5 tablets (12.5 mg total) by mouth 2 (two) times daily. 12/15/15   Jerline Pain, MD  mupirocin cream (BACTROBAN) 2 % Apply topically 2 (two) times daily. 09/25/15   [name], MD  nitroGLYCERIN (NITROSTAT) 0.4 MG SL tablet Place 1 tablet (0.4 mg total) under the tongue every 5 (five) minutes as needed for chest pain. 12/08/14   Erlene Quan, PA-C  omeprazole (PRILOSEC) 40 MG capsule Take 1 capsule (40 mg total) by mouth daily. 10/13/15   Lavina Hamman, MD  oxybutynin (DITROPAN) 5 MG tablet Take 5 mg by mouth daily as needed for bladder spasms.     Historical Provider, MD  potassium chloride SA (K-DUR,KLOR-CON) 20 MEQ tablet Take 2 tablets (40 mEq total) by mouth daily. 10/13/15   Lavina Hamman, MD  rOPINIRole (REQUIP) 0.5 MG tablet Take 0.5 mg by mouth at bedtime.    Historical Provider, MD  senna (SENOKOT) 8.6 MG tablet Take 2 tablets by mouth daily as needed for constipation.    Historical Provider, MD  traMADol (ULTRAM) 50 MG tablet Take 50 mg by mouth 2 (two) times daily as needed for moderate pain.  09/21/15   Historical Provider, MD  vitamin C (ASCORBIC ACID) 500 MG tablet Take 500 mg by mouth 2 (two) times daily.    Historical Provider, MD   There were no vitals taken for  this visit. Physical Exam  HENT:  Head: Normocephalic and atraumatic.  Eyes: Conjunctivae are normal. No scleral icterus.  Neck: Normal range of motion. Neck supple.  Cardiovascular: Normal rate, regular rhythm and normal heart sounds.   Pulmonary/Chest: Effort normal and breath sounds normal. No respiratory distress.  Abdominal: Soft. He exhibits no distension. There is no tenderness.  Genitourinary:  Indwelling catheter with discharge from meatus. Tenderness in the testicles.  No outward signs of infection or cellulitis  Musculoskeletal: He exhibits no edema.  Neurological: He is disoriented and unresponsive. GCS eye subscore is 1. GCS verbal subscore is 2. GCS motor subscore is 4.  Follows occasional commands.  Skin: Skin is warm and dry.  Multiple areas of excoriation.   Psychiatric: His behavior is normal.  Nursing note and vitals reviewed.   ED Course  Procedures (including critical care time) Labs Review Labs Reviewed  CBC WITH DIFFERENTIAL/PLATELET - Abnormal; Notable for the following:    WBC 19.6 (*)    Neutro Abs 16.8 (*)    Monocytes Absolute 1.5 (*)    All other components within normal limits  CULTURE, BLOOD (ROUTINE X 2)  CULTURE, BLOOD (ROUTINE X 2)  URINE CULTURE  COMPREHENSIVE METABOLIC PANEL  URINALYSIS, ROUTINE W REFLEX MICROSCOPIC (NOT AT [facility])  I-STAT CG4 LACTIC ACID, ED  I-STAT TROPOININ, ED    Imaging Review No results found. I have personally reviewed and evaluated these images and lab results as part of my medical decision-making.   EKG Interpretation None      MDM   Final diagnoses:  Sepsis ([place])    3:13 PM BP 134/75 mmHg  Pulse 112  SpO2 97% Patient with leukocytosis and AMS. Started on abx for suspected UTI. Will obtain US of the scrotum 1 L fluid bolus.  lactic acid is normal   i have given report to Dr. Vanita Panda. Who will follow up on the patient.  He will need admission for AMS.   Margarita Mail, PA-C 01/11/16  [place], MD 01/11/16 1901

## 2016-01-11 NOTE — Progress Notes (Signed)
Facility:  [facility]     CODE STATUS: DNR  Allergies  Allergen Reactions  . Ace Inhibitors Swelling    Pt tolerates lisinopril  . Lipitor [Atorvastatin Calcium] Swelling  . Metformin And Related Swelling  . Cefadroxil Hives  . Cephalexin Hives  . Morphine And Related Other (See Comments)    Sweating, feels like is "in rocky boat."  . Robaxin [Methocarbamol] Other (See Comments)    Feels like he is shaky    Chief Complaint  Patient presents with  . Acute Visit    altered mental status     HPI:  Staff reports that he is not responsive. He is aware of his surroundings; is not able to verbalize needs has right side weakness present. He is willing to go to he ED for further evaluation; after his brother called him.   Past Medical History  Diagnosis Date  . Obese   . Hypertension   . Diabetic neuropathy, painful ([place])   . Chronic indwelling Foley catheter   . Bladder spasms   . Hyperlipidemia   . GERD (gastroesophageal reflux disease)   . Neurogenic bladder   . CAD (coronary artery disease)   . PONV (postoperative nausea and vomiting)   . CHF (congestive heart failure) ([place])   . DVT (deep venous thrombosis) (HCC) 1980's    LLE  . Type II diabetes mellitus ([place])   . Diabetic diarrhea ([place])   . Pneumonia 1999  . OSA (obstructive sleep apnea)     "they wanted me to wear a mask; I couldn't" (11/10/2014)  . Arthritis     "hands, ankles, knees" (11/10/2014)  . Chronic lower back pain   . Stroke [facility]) 10/2011; 06/2014    right hand numbness/notes 10/20/2011, pt not sure he had stroke in 2013; "right hand weaker and right face not quite right since" (11/10/2014)    Past Surgical History  Procedure Laterality Date  . Gastroplasty    . Total knee arthroplasty Right 1990's  . Left heart catheterization with coronary angiogram N/A 10/24/2011    Procedure: LEFT HEART CATHETERIZATION WITH CORONARY ANGIOGRAM;  Surgeon: Candee Furbish, MD;  Location: [facility] CATH LAB;  Service:  Cardiovascular;  Laterality: N/A;  right radial artery approach  . Cholecystectomy open  1970's?  . Joint replacement    . Carpal tunnel release Bilateral ~ 2003-2004  . Cardiac catheterization N/A 05/02/2015    Procedure: Left Heart Cath and Coronary Angiography;  Surgeon: Lorretta Harp, MD;  Location: [place] CV LAB;  Service: Cardiovascular;  Laterality: N/A;  . Cardiac catheterization N/A 05/04/2015    Procedure: Left Heart Cath and Coronary Angiography;  Surgeon: Wellington Hampshire, MD;  Location: [place] CV LAB;  Service: Cardiovascular;  Laterality: N/A;    Social History   Social History  . Marital Status: Widowed    Spouse Name: N/A  . Number of Children: N/A  . Years of Education: N/A   Occupational History  . Retired    Social History Main Topics  . Smoking status: Former Smoker -- 2.00 packs/day for 20 years    Types: Cigarettes    Quit date: 09/10/1976  . Smokeless tobacco: Never Used  . 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 Drug Use: No  . Sexual Activity: No   Other Topics Concern  . Not on file   Social History Narrative   Lives alone.  No close family nearby.     Family  History  Problem Relation Age of Onset  . Hypertension Mother   . Diabetes type I Father   . Cancer Brother      Testicular Cancer  . Cancer Brother     Leukemia      VITAL SIGNS BP 167/80 mmHg  Pulse 78  Resp 18  Ht 6\' 1"  (1.854 m)  Wt 343 lb (155.584 kg)  BMI 45.26 kg/m2  SpO2 93%  Patient's Medications  New Prescriptions   No medications on file  Previous Medications   ALUM & MAG HYDROXIDE-SIMETH (MAALOX/MYLANTA) 200-200-20 MG/5ML SUSPENSION    Take 30 mLs by mouth every 6 (six) hours as needed for indigestion or heartburn (dyspepsia).   ASPIRIN EC 81 MG TABLET    Take 81 mg by mouth every morning.    DIPHENOXYLATE-ATROPINE (LOMOTIL) 2.5-0.025 MG TABLET    Take 1 tablet by mouth 3 (three) times daily as needed for diarrhea  or loose stools.    FEXOFENADINE (ALLEGRA) 180 MG TABLET    Take 180 mg by mouth daily as needed for allergies or rhinitis.   FUROSEMIDE (LASIX) 40 MG TABLET    Take 1 tablet (40 mg total) by mouth 2 (two) times daily.   HYDROXYZINE (ATARAX/VISTARIL) 25 MG TABLET    Take 25 mg by mouth 2 (two) times daily.   HYDROXYZINE (ATARAX/VISTARIL) 50 MG TABLET    Take 50 mg by mouth at bedtime.   INSULIN NPH HUMAN (HUMULIN N) 100 UNIT/ML INJECTION    Inject 0.28 mLs (28 Units total) into the skin 2 (two) times daily at 8 am and 10 pm.   MAGNESIUM OXIDE (MAG-OX) 400 (241.3 MG) MG TABLET    Take 1 tablet (400 mg total) by mouth daily.   METOPROLOL (LOPRESSOR) 25 MG TABLET    Take 0.5 tablets (12.5 mg total) by mouth 2 (two) times daily.   MUPIROCIN CREAM (BACTROBAN) 2 %    Apply topically 2 (two) times daily.   NITROGLYCERIN (NITROSTAT) 0.4 MG SL TABLET    Place 1 tablet (0.4 mg total) under the tongue every 5 (five) minutes as needed for chest pain.   OMEPRAZOLE (PRILOSEC) 40 MG CAPSULE    Take 1 capsule (40 mg total) by mouth daily.   OXYBUTYNIN (DITROPAN) 5 MG TABLET    Take 5 mg by mouth daily as needed for bladder spasms.    POTASSIUM CHLORIDE SA (K-DUR,KLOR-CON) 20 MEQ TABLET    Take 2 tablets (40 mEq total) by mouth daily.   ROPINIROLE (REQUIP) 0.5 MG TABLET    Take 0.5 mg by mouth at bedtime.   SENNA (SENOKOT) 8.6 MG TABLET    Take 2 tablets by mouth daily as needed for constipation.   TRAMADOL (ULTRAM) 50 MG TABLET    Take 50 mg by mouth 2 (two) times daily as needed for moderate pain.    VITAMIN C (ASCORBIC ACID) 500 MG TABLET    Take 500 mg by mouth 2 (two) times daily.  Modified Medications   No medications on file  Discontinued Medications   No medications on file     SIGNIFICANT DIAGNOSTIC EXAMS   10-12-15: chest x-ray: Enlarged cardiac silhouette. Pulmonary vascular congestion.  10-18-15: EEG: This is a normal EEG for the patients stated age.  There were no focal, hemispheric or  lateralizing features.  No epileptiform activity was recorded.  A normal EEG does not exclude the diagnosis of a seizure disorder and if seizure remains high on the list of differential diagnosis, an  ambulatory EEG may be of value.  Clinical correlation is required.    LABS REVIEWED:   09-10-15: hgb a1c 10.4 09-15-15; tsh 2.954 10-12-15; wbc 5.9; hgb 11.8; hct 36.1; mcv 93.5; pt 195; glucose 187; bun 8; creat 0.78; k+ 5.0; na++136 11-03-15: wbc 9.2; hgb 12.7; hct 39.7; mcv 89.3; plt 267; glucose 105; bun 17.2; creat 0.61; k+ 4.2; na++135      Review of Systems  Unable to perform ROS: medical condition     Physical Exam  Constitutional: He is oriented to person, place, and time. No distress.  Obese   Eyes: Conjunctivae are normal.  Neck: Neck supple. No JVD present. No thyromegaly present.  Cardiovascular: Normal rate, regular rhythm and intact distal pulses.   Respiratory: Effort normal and breath sounds normal. No respiratory distress. He has no wheezes.  GI: Soft. Bowel sounds are normal. He exhibits no distension. There is no tenderness.  Genitourinary:  Has foley   Musculoskeletal: He exhibits edema.  Able to move all extremities Has right side weakness present.    Lymphadenopathy:    He has no cervical adenopathy.  Neurological: . he is aware of surroundings  Skin: Skin is warm and dry. He is not diaphoretic. He does have excessively dry skin.          ASSESSMENT/ PLAN:  Question CVA: he has expressive aphasia at this time with right side weakness will send to the ED for further evaluation.     Time spent with patient   40  minutes >50% time spent counseling; reviewing medical record; tests; labs; and developing future plan of care   Ok Edwards NP [facility]  Contact [phone] Monday through Friday 8am- 5pm  After hours call [phone]

## 2016-01-12 DIAGNOSIS — A419 Sepsis, unspecified organism: Secondary | ICD-10-CM | POA: Diagnosis present

## 2016-01-12 DIAGNOSIS — Z794 Long term (current) use of insulin: Secondary | ICD-10-CM | POA: Diagnosis not present

## 2016-01-12 DIAGNOSIS — E1165 Type 2 diabetes mellitus with hyperglycemia: Secondary | ICD-10-CM | POA: Diagnosis present

## 2016-01-12 DIAGNOSIS — M25559 Pain in unspecified hip: Secondary | ICD-10-CM | POA: Diagnosis present

## 2016-01-12 DIAGNOSIS — N319 Neuromuscular dysfunction of bladder, unspecified: Secondary | ICD-10-CM | POA: Diagnosis present

## 2016-01-12 DIAGNOSIS — K219 Gastro-esophageal reflux disease without esophagitis: Secondary | ICD-10-CM | POA: Diagnosis present

## 2016-01-12 DIAGNOSIS — N453 Epididymo-orchitis: Secondary | ICD-10-CM | POA: Diagnosis not present

## 2016-01-12 DIAGNOSIS — E875 Hyperkalemia: Secondary | ICD-10-CM | POA: Diagnosis present

## 2016-01-12 DIAGNOSIS — Z8673 Personal history of transient ischemic attack (TIA), and cerebral infarction without residual deficits: Secondary | ICD-10-CM | POA: Diagnosis not present

## 2016-01-12 DIAGNOSIS — B961 Klebsiella pneumoniae [K. pneumoniae] as the cause of diseases classified elsewhere: Secondary | ICD-10-CM | POA: Diagnosis present

## 2016-01-12 DIAGNOSIS — E1151 Type 2 diabetes mellitus with diabetic peripheral angiopathy without gangrene: Secondary | ICD-10-CM

## 2016-01-12 DIAGNOSIS — Z66 Do not resuscitate: Secondary | ICD-10-CM | POA: Diagnosis present

## 2016-01-12 DIAGNOSIS — R6520 Severe sepsis without septic shock: Secondary | ICD-10-CM | POA: Diagnosis not present

## 2016-01-12 DIAGNOSIS — N390 Urinary tract infection, site not specified: Secondary | ICD-10-CM | POA: Diagnosis not present

## 2016-01-12 DIAGNOSIS — E1140 Type 2 diabetes mellitus with diabetic neuropathy, unspecified: Secondary | ICD-10-CM | POA: Diagnosis present

## 2016-01-12 DIAGNOSIS — Z9289 Personal history of other medical treatment: Secondary | ICD-10-CM | POA: Diagnosis not present

## 2016-01-12 DIAGNOSIS — E11621 Type 2 diabetes mellitus with foot ulcer: Secondary | ICD-10-CM | POA: Diagnosis present

## 2016-01-12 DIAGNOSIS — Z7982 Long term (current) use of aspirin: Secondary | ICD-10-CM | POA: Diagnosis not present

## 2016-01-12 DIAGNOSIS — T83518A Infection and inflammatory reaction due to other urinary catheter, initial encounter: Secondary | ICD-10-CM | POA: Diagnosis present

## 2016-01-12 DIAGNOSIS — Z888 Allergy status to other drugs, medicaments and biological substances status: Secondary | ICD-10-CM | POA: Diagnosis not present

## 2016-01-12 DIAGNOSIS — Z885 Allergy status to narcotic agent status: Secondary | ICD-10-CM | POA: Diagnosis not present

## 2016-01-12 DIAGNOSIS — I2510 Atherosclerotic heart disease of native coronary artery without angina pectoris: Secondary | ICD-10-CM | POA: Diagnosis present

## 2016-01-12 DIAGNOSIS — Y848 Other medical procedures as the cause of abnormal reaction of the patient, or of later complication, without mention of misadventure at the time of the procedure: Secondary | ICD-10-CM | POA: Diagnosis present

## 2016-01-12 DIAGNOSIS — G8929 Other chronic pain: Secondary | ICD-10-CM | POA: Diagnosis present

## 2016-01-12 DIAGNOSIS — M25512 Pain in left shoulder: Secondary | ICD-10-CM | POA: Diagnosis not present

## 2016-01-12 DIAGNOSIS — I5033 Acute on chronic diastolic (congestive) heart failure: Secondary | ICD-10-CM | POA: Diagnosis not present

## 2016-01-12 DIAGNOSIS — T83511A Infection and inflammatory reaction due to indwelling urethral catheter, initial encounter: Secondary | ICD-10-CM | POA: Diagnosis not present

## 2016-01-12 DIAGNOSIS — T83511D Infection and inflammatory reaction due to indwelling urethral catheter, subsequent encounter: Secondary | ICD-10-CM | POA: Diagnosis not present

## 2016-01-12 DIAGNOSIS — I110 Hypertensive heart disease with heart failure: Secondary | ICD-10-CM | POA: Diagnosis present

## 2016-01-12 DIAGNOSIS — Z8249 Family history of ischemic heart disease and other diseases of the circulatory system: Secondary | ICD-10-CM | POA: Diagnosis not present

## 2016-01-12 DIAGNOSIS — L8990 Pressure ulcer of unspecified site, unspecified stage: Secondary | ICD-10-CM | POA: Diagnosis not present

## 2016-01-12 DIAGNOSIS — F329 Major depressive disorder, single episode, unspecified: Secondary | ICD-10-CM | POA: Diagnosis present

## 2016-01-12 DIAGNOSIS — E785 Hyperlipidemia, unspecified: Secondary | ICD-10-CM | POA: Diagnosis present

## 2016-01-12 DIAGNOSIS — G9340 Encephalopathy, unspecified: Secondary | ICD-10-CM | POA: Diagnosis not present

## 2016-01-12 DIAGNOSIS — Z6841 Body Mass Index (BMI) 40.0 and over, adult: Secondary | ICD-10-CM | POA: Diagnosis not present

## 2016-01-12 DIAGNOSIS — E0865 Diabetes mellitus due to underlying condition with hyperglycemia: Secondary | ICD-10-CM

## 2016-01-12 DIAGNOSIS — E1169 Type 2 diabetes mellitus with other specified complication: Secondary | ICD-10-CM | POA: Diagnosis not present

## 2016-01-12 DIAGNOSIS — Z79899 Other long term (current) drug therapy: Secondary | ICD-10-CM | POA: Diagnosis not present

## 2016-01-12 DIAGNOSIS — G4733 Obstructive sleep apnea (adult) (pediatric): Secondary | ICD-10-CM | POA: Diagnosis present

## 2016-01-12 DIAGNOSIS — R55 Syncope and collapse: Secondary | ICD-10-CM | POA: Diagnosis not present

## 2016-01-12 DIAGNOSIS — L89309 Pressure ulcer of unspecified buttock, unspecified stage: Secondary | ICD-10-CM | POA: Diagnosis present

## 2016-01-12 DIAGNOSIS — Z87891 Personal history of nicotine dependence: Secondary | ICD-10-CM | POA: Diagnosis not present

## 2016-01-12 DIAGNOSIS — Z86718 Personal history of other venous thrombosis and embolism: Secondary | ICD-10-CM | POA: Diagnosis not present

## 2016-01-12 DIAGNOSIS — I5032 Chronic diastolic (congestive) heart failure: Secondary | ICD-10-CM | POA: Diagnosis not present

## 2016-01-12 DIAGNOSIS — R4182 Altered mental status, unspecified: Secondary | ICD-10-CM | POA: Diagnosis not present

## 2016-01-12 DIAGNOSIS — I82891 Chronic embolism and thrombosis of other specified veins: Secondary | ICD-10-CM | POA: Diagnosis not present

## 2016-01-12 DIAGNOSIS — M6281 Muscle weakness (generalized): Secondary | ICD-10-CM | POA: Diagnosis not present

## 2016-01-12 DIAGNOSIS — Z96651 Presence of right artificial knee joint: Secondary | ICD-10-CM | POA: Diagnosis present

## 2016-01-12 LAB — BASIC METABOLIC PANEL
ANION GAP: 9 (ref 5–15)
BUN: 15 mg/dL (ref 6–20)
CHLORIDE: 101 mmol/L (ref 101–111)
CO2: 23 mmol/L (ref 22–32)
Calcium: 8.2 mg/dL — ABNORMAL LOW (ref 8.9–10.3)
Creatinine, Ser: 0.67 mg/dL (ref 0.61–1.24)
GFR calc Af Amer: >60 mL/min (ref >60)
GFR calc non Af Amer: >60 mL/min (ref >60)
GLUCOSE: 238 mg/dL — AB (ref 65–99)
Potassium: 5.3 mmol/L — ABNORMAL HIGH (ref 3.5–5.1)
Sodium: 133 mmol/L — ABNORMAL LOW (ref 135–145)

## 2016-01-12 LAB — GLUCOSE, CAPILLARY
GLUCOSE-CAPILLARY: 214 mg/dL — AB (ref 65–99)
GLUCOSE-CAPILLARY: 193 mg/dL — AB (ref 65–99)
Glucose-Capillary: 163 mg/dL — ABNORMAL HIGH (ref 65–99)
Glucose-Capillary: 166 mg/dL — ABNORMAL HIGH (ref 65–99)

## 2016-01-12 LAB — CBC
HEMATOCRIT: 37.8 % — AB (ref 39.0–52.0)
HEMOGLOBIN: 12.8 g/dL — AB (ref 13.0–17.0)
MCH: 30.7 pg (ref 26.0–34.0)
MCHC: 33.9 g/dL (ref 30.0–36.0)
MCV: 90.6 fL (ref 78.0–100.0)
Platelets: 186 10*3/uL (ref 150–400)
RBC: 4.17 MIL/uL — AB (ref 4.22–5.81)
RDW: 13.3 % (ref 11.5–15.5)
WBC: 18.5 10*3/uL — ABNORMAL HIGH (ref 4.0–10.5)

## 2016-01-12 LAB — BRAIN NATRIURETIC PEPTIDE: B Natriuretic Peptide: 201.1 pg/mL — ABNORMAL HIGH (ref 0.0–100.0)

## 2016-01-12 LAB — MRSA PCR SCREENING: MRSA BY PCR: NEGATIVE

## 2016-01-12 MED ORDER — NITROGLYCERIN 0.4 MG SL SUBL: 0.4000 mg | SUBLINGUAL_TABLET | SUBLINGUAL | Status: DC | PRN

## 2016-01-12 MED ORDER — HYDROMORPHONE HCL 1 MG/ML IJ SOLN
1.0000 mg | Freq: Once | INTRAMUSCULAR | Status: AC
  Administered 2016-01-12: 1 mg via INTRAVENOUS
  Filled 2016-01-12: qty 1

## 2016-01-12 MED ORDER — VANCOMYCIN HCL 10 G IV SOLR
1500.0000 mg | Freq: Two times a day (BID) | INTRAVENOUS | Status: DC
  Administered 2016-01-13 – 2016-01-14 (×2): 1500 mg via INTRAVENOUS
  Filled 2016-01-12 (×3): qty 1500

## 2016-01-12 MED ORDER — FUROSEMIDE 40 MG PO TABS
40.0000 mg | ORAL_TABLET | Freq: Two times a day (BID) | ORAL | Status: DC
  Administered 2016-01-12 – 2016-01-16 (×8): 40 mg via ORAL
  Filled 2016-01-12 (×8): qty 1

## 2016-01-12 MED ORDER — TRAMADOL-ACETAMINOPHEN 37.5-325 MG PO TABS
2.0000 | ORAL_TABLET | Freq: Four times a day (QID) | ORAL | Status: DC | PRN
  Administered 2016-01-12 – 2016-01-16 (×5): 2 via ORAL
  Filled 2016-01-12 (×5): qty 2

## 2016-01-12 MED ORDER — PRO-STAT SUGAR FREE PO LIQD
30.0000 mL | Freq: Three times a day (TID) | ORAL | Status: DC
  Administered 2016-01-12 (×2): 30 mL via ORAL
  Filled 2016-01-12 (×4): qty 30

## 2016-01-12 MED ORDER — GABAPENTIN 100 MG PO CAPS
100.0000 mg | ORAL_CAPSULE | Freq: Two times a day (BID) | ORAL | Status: DC
  Administered 2016-01-12 – 2016-01-16 (×9): 100 mg via ORAL
  Filled 2016-01-12 (×9): qty 1

## 2016-01-12 MED ORDER — ROPINIROLE HCL 1 MG PO TABS
0.5000 mg | ORAL_TABLET | Freq: Every day | ORAL | Status: DC
  Administered 2016-01-12 – 2016-01-15 (×4): 0.5 mg via ORAL
  Filled 2016-01-12 (×4): qty 1

## 2016-01-12 MED ORDER — ESCITALOPRAM OXALATE 10 MG PO TABS
10.0000 mg | ORAL_TABLET | Freq: Every morning | ORAL | Status: DC
  Administered 2016-01-12 – 2016-01-16 (×5): 10 mg via ORAL
  Filled 2016-01-12 (×5): qty 1

## 2016-01-12 MED ORDER — PRO-STAT PO LIQD: 1.0000 | Freq: Three times a day (TID) | ORAL | Status: DC

## 2016-01-12 MED ORDER — HYDROXYZINE HCL 25 MG PO TABS
25.0000 mg | ORAL_TABLET | Freq: Two times a day (BID) | ORAL | Status: DC
  Administered 2016-01-12 – 2016-01-16 (×9): 25 mg via ORAL
  Filled 2016-01-12 (×9): qty 1

## 2016-01-12 MED ORDER — METOPROLOL TARTRATE 12.5 MG HALF TABLET
12.5000 mg | ORAL_TABLET | Freq: Two times a day (BID) | ORAL | Status: DC
  Administered 2016-01-12 – 2016-01-16 (×9): 12.5 mg via ORAL
  Filled 2016-01-12 (×9): qty 1

## 2016-01-12 MED ORDER — VITAMIN C 500 MG PO TABS
500.0000 mg | ORAL_TABLET | Freq: Two times a day (BID) | ORAL | Status: DC
  Administered 2016-01-12 – 2016-01-16 (×9): 500 mg via ORAL
  Filled 2016-01-12 (×9): qty 1

## 2016-01-12 MED ORDER — ASPIRIN EC 81 MG PO TBEC
81.0000 mg | DELAYED_RELEASE_TABLET | Freq: Every morning | ORAL | Status: DC
  Administered 2016-01-13 – 2016-01-16 (×4): 81 mg via ORAL
  Filled 2016-01-12 (×4): qty 1

## 2016-01-12 MED ORDER — SENNOSIDES-DOCUSATE SODIUM 8.6-50 MG PO TABS
2.0000 | ORAL_TABLET | Freq: Every day | ORAL | Status: DC
  Administered 2016-01-12 – 2016-01-15 (×4): 2 via ORAL
  Filled 2016-01-12 (×4): qty 2

## 2016-01-12 MED ORDER — SODIUM POLYSTYRENE SULFONATE 15 GM/60ML PO SUSP
15.0000 g | Freq: Once | ORAL | Status: AC
  Administered 2016-01-12: 15 g via ORAL
  Filled 2016-01-12: qty 60

## 2016-01-12 NOTE — Care Management Note (Signed)
Case Management Note  Patient Details  Name: [name] MRN: [id number] Date of Birth: 06-25-1946  Subjective/Objective:                 Spoke with patient at the bedside. He states that he has been living at [facility] for the past 3 months. In obs with UTI, has indwelling FC is WC bound.    Action/Plan:  Will return to SNF when medically stable.  Expected Discharge Date:                  Expected Discharge Plan:  [place] (From [place])  In-House Referral:  Clinical Social Work  Discharge planning Services  CM Consult  Post Acute Care Choice:  NA Choice offered to:     DME Arranged:    DME Agency:     HH Arranged:    [place] Agency:     Status of Service:  Completed, signed off  Medicare Important Message Given:    Date Medicare IM Given:    Medicare IM give by:    Date Additional Medicare IM Given:    Additional Medicare Important Message give by:     If discussed at [place] of Stay Meetings, dates discussed:    Additional Comments:  Carles Collet, RN 01/12/2016, 1:16 PM

## 2016-01-12 NOTE — Evaluation (Signed)
Physical Therapy Evaluation Patient Details Name: [name] MRN: [id number] DOB: [birth year] Today's Date: 01/12/2016   History of Present Illness  70 year old morbidly obese male with history of stroke, mostly nonambulatory, hypertension, hyperlipidemia, diabetes mellitus, depression, chronic indwelling Foley, CAD, diastolic CHF, DVT not on anticoagulation, OSA (not on CPAP), chronic back pain presented with scrotal pain and altered mental status.  Found positive for UTI/sepsis.  Clinical Impression  Patient presents close to current baseline despite currently on bedrest and only bed level evaluation completed.  Has been unable to tolerate continued therapy in facility due to passing out when sitting up.  States hasn't even tolerated up with lift to recliner at facility.  Does report he would like to continue to work on upper body strengthening during acute stay as he uses weights when at home at facility.  No follow up PT needs at this time.  Will follow in acute setting for upper body strength activities.     Follow Up Recommendations No PT follow up    Equipment Recommendations  None recommended by PT    Recommendations for Other Services       Precautions / Restrictions Precautions Precautions: Fall      Mobility  Bed Mobility Overal bed mobility: Needs Assistance Bed Mobility: Rolling Rolling: Min assist         General bed mobility comments: cue to reach for railing. easier rolling L than R and did both to assist with hygiene due to small BM; pt scooted up to HOB min A +2 with bed in trendelenberg pulling up on headboard  Transfers                    Ambulation/Gait                Stairs            Wheelchair Mobility    Modified Rankin (Stroke Patients Only)       Balance                                             Pertinent Vitals/Pain Pain Assessment: Faces Faces Pain Scale: Hurts even more Pain Location: R  thigh esp with movement (sciatic pain per pt) Pain Descriptors / Indicators: Aching Pain Intervention(s): Monitored during session;Repositioned    Home Living Family/patient expects to be discharged to:: Skilled nursing facility                      Prior Function Level of Independence: Needs assistance   Gait / Transfers Assistance Needed: non ambulatory due to passing out; reports doesn't get up to sit much either   ADL's / Homemaking Assistance Needed: assist at facility        Hand Dominance        Extremity/Trunk Assessment   Upper Extremity Assessment: LUE deficits/detail;RUE deficits/detail RUE Deficits / Details: weak grip compared to L. AROM grossly WFL     LUE Deficits / Details: reports rotator cuff issues on L shoulder and lifts to about 95 actively with pain, strength in biceps WFL   Lower Extremity Assessment: RLE deficits/detail;LLE deficits/detail RLE Deficits / Details: AAROM limited by pain in thigh strength at least 3/5 LLE Deficits / Details: AROM WFL, strength 4/5     Communication   Communication: No difficulties  Cognition Arousal/Alertness: Awake/alert Behavior During Therapy:  WFL for tasks assessed/performed Overall Cognitive Status: Within Functional Limits for tasks assessed                      General Comments      Exercises        Assessment/Plan    PT Assessment Patient needs continued PT services  PT Diagnosis Generalized weakness   PT Problem List Decreased strength;Decreased range of motion;Decreased activity tolerance;Pain  PT Treatment Interventions Therapeutic exercise;Therapeutic activities;Modalities;Patient/family education   PT Goals (Current goals can be found in the Care Plan section) Acute Rehab PT Goals Patient Stated Goal: To help strength PT Goal Formulation: With patient Time For Goal Achievement: 01/17/16 Potential to Achieve Goals: Fair    Frequency Min 2X/week   Barriers to discharge         Co-evaluation               End of Session   Activity Tolerance: Patient tolerated treatment well Patient left: in bed;with call bell/phone within reach;with bed alarm set      Functional Assessment Tool Used: Clinical Judgement Functional Limitation: Changing and maintaining body position Changing and Maintaining Body Position Current Status [id number]): At least 1 percent but less than 20 percent impaired, limited or restricted Changing and Maintaining Body Position Goal Status [id number]): At least 1 percent but less than 20 percent impaired, limited or restricted    Time: 1516-1540 PT Time Calculation (min) (ACUTE ONLY): 24 min   Charges:   PT Evaluation $PT Eval Moderate Complexity: 1 Procedure PT Treatments $Therapeutic Activity: 8-22 mins   PT G Codes:   PT G-Codes **NOT FOR INPATIENT CLASS** Functional Assessment Tool Used: Clinical Judgement Functional Limitation: Changing and maintaining body position Changing and Maintaining Body Position Current Status [id number]): At least 1 percent but less than 20 percent impaired, limited or restricted Changing and Maintaining Body Position Goal Status [id number]): At least 1 percent but less than 20 percent impaired, limited or restricted    Reginia Naas 01/12/2016, 4:19 PM  Magda Kiel, [place] 01/12/2016

## 2016-01-12 NOTE — Progress Notes (Signed)
[facility] PROGRESS NOTE  [name] [id number] DOB: July 21, 1946 DOA: 01/11/2016 PCP: Irven Shelling, MD  brief narrative 70 year old morbidly obese male with history of stroke, mostly nonambulatory, hypertension, hyperlipidemia, diabetes mellitus, depression, chronic indwelling Foley, CAD, diastolic CHF, DVT not on anticoagulation, OSA (not on CPAP), chronic back pain presented with scrotal pain and altered mental status. Patient does not remember the incident much says he was having pain in his testes for the last few days. As per admission H&P he was normal on the morning of admission but was later found to be confused and minimally responsive. In the ED he was found to be septic with a low-grade fever, WBC of 19.6 K, tachycardic and tachypnea. UA was positive for UTI and on exam had erythema with swelling of the scrotum. Ultrasound of the scrotum showed left epididymoorchitis, microlithiasis involving the stented vessels and 3 adjacent  complex cysts/spermatoceles involving left epididymal head. Patient admitted for sepsis due to UTI and epididymoorchitis. Sepsis pathway initiated in the ED.   Assessment/Plan: Sepsis secondary to catheter associated UTI and epididymoorchitis Admitted to telemetry. Empiric vancomycin and aztreonam. Sepsis and encephalopathy now resolved. Reportedly fully catheter change in the ED. (As per patient catheter should have been changed every month but was not changed in the past 3 months). -Follow urine culture and blood culture.  -He does not have any pain in the scrotum or testes. Add when necessary Ultracet for pain.  Acute encephalopathy Secondary to sepsis. Now resolved. Home medication and diet resume.  History of stroke Patient reports being nonambulatory. Resume aspirin and beta blocker.   Chronic diastolic CHF Euvolemic. Continue aspirin, beta blocker and Lasix.   Chronic hip pain Reports shooting pain down to the legs and is  chronic. Added Neurontin.  Hyperkalemia Will order Kayexalate. Stable on telemetry.  Uncontrolled type 2 diabetes mellitus Last A1c of 10.4. Resume home dose NPH and sliding scale coverage.  GERD Continue PPI  Pressure ulcer Wound care consulted.  Coronary artery disease Continue aspirin and beta blockers  Depression Resume home medications      Code Status: DO NOT RESUSCITATE Family Communication: None at bedside  Disposition Plan: Return to skilled nursing facility once clinically improved. Possibly in the next 48 hours   Consultants:  None  Procedures:  Ultrasound scrotum  Antibiotics:  IV vancomycin and aztreonam since 5/4-  HPI/Subjective: Seen and examined. Appears alert and oriented.  Objective: Filed Vitals:   01/12/16 1105 01/12/16 1345  BP: 143/65 133/65  Pulse: 78 73  Temp:  97.8 F (36.6 C)  Resp:  17    Intake/Output Summary (Last 24 hours) at 01/12/16 1428 Last data filed at 01/12/16 0739  Gross per 24 hour  Intake   1800 ml  Output   1000 ml  Net    800 ml   Filed Weights   01/11/16 2104  Weight: 155 kg (341 lb 11.4 oz)    Exam:   General:  Elderly morbidly obese male not in distress  HEENT: No pallor, moist mucosa, supple neck  Chest: Clear bilaterally  CVS: Normal S1 and S2, no murmurs rub or gallop  Abdomen: Soft, nondistended, nontender, chronic indwelling Foley, erythema and swelling of the scrotum with tenderness to palpation  Musculoskeletal: Warm, no edema  CNS: Alert and oriented  Data Reviewed: Basic Metabolic Panel:  Recent Labs Lab 01/11/16 1442 01/12/16 0530  NA 132* 133*  K 4.7 5.3*  CL 99* 101  CO2 23 23  GLUCOSE 255* 238*  BUN 15 15  CREATININE 0.67 0.67  CALCIUM 8.7* 8.2*   Liver Function Tests:  Recent Labs Lab 01/11/16 1442  AST 17  ALT 13*  ALKPHOS 102  BILITOT 0.6  PROT 6.4*  ALBUMIN 2.9*   No results for input(s): LIPASE, AMYLASE in the last 168 hours. No results for  input(s): AMMONIA in the last 168 hours. CBC:  Recent Labs Lab 01/11/16 1442 01/12/16 0530  WBC 19.6* 18.5*  NEUTROABS 16.8*  --   HGB 13.6 12.8*  HCT 40.1 37.8*  MCV 87.6 90.6  PLT 232 186   Cardiac Enzymes: No results for input(s): CKTOTAL, CKMB, CKMBINDEX, TROPONINI in the last 168 hours. BNP (last 3 results)  Recent Labs  07/02/15 1439 09/23/15 0521 01/12/16 0530  BNP 320.3* 130.9* 201.1*    ProBNP (last 3 results) No results for input(s): PROBNP in the last 8760 hours.  CBG:  Recent Labs Lab 01/11/16 2109 01/12/16 0805 01/12/16 1155  GLUCAP 287* 214* 163*    Recent Results (from the past 240 hour(s))  Blood Culture (routine x 2)     Status: None (Preliminary result)   Collection Time: 01/11/16  2:42 PM  Result Value Ref Range Status   Specimen Description BLOOD RIGHT WRIST  Final   Special Requests BOTTLES DRAWN AEROBIC AND ANAEROBIC 5CC  Final   Culture NO GROWTH < 24 HOURS  Final   Report Status PENDING  Incomplete  Blood Culture (routine x 2)     Status: None (Preliminary result)   Collection Time: 01/11/16  2:54 PM  Result Value Ref Range Status   Specimen Description BLOOD LEFT HAND  Final   Special Requests BOTTLES DRAWN AEROBIC ONLY 5CC  Final   Culture NO GROWTH < 24 HOURS  Final   Report Status PENDING  Incomplete  Urine culture     Status: None (Preliminary result)   Collection Time: 01/11/16  2:55 PM  Result Value Ref Range Status   Specimen Description URINE, CLEAN CATCH  Final   Special Requests NONE  Final   Culture TOO YOUNG TO READ  Final   Report Status PENDING  Incomplete  MRSA PCR Screening     Status: None   Collection Time: 01/11/16 10:32 PM  Result Value Ref Range Status   MRSA by PCR NEGATIVE NEGATIVE Final    Comment:        The GeneXpert MRSA Assay (FDA approved for NASAL specimens only), is one component of a comprehensive MRSA colonization surveillance program. It is not intended to diagnose MRSA infection nor to  guide or monitor treatment for MRSA infections.      Studies: Ct Head Wo Contrast  01/11/2016  CLINICAL DATA:  Altered mental status, dysphagia EXAM: CT HEAD WITHOUT CONTRAST TECHNIQUE: Contiguous axial images were obtained from the base of the skull through the vertex without intravenous contrast. COMPARISON:  09/22/2015 FINDINGS: Brain: No intracranial hemorrhage, mass effect or midline shift. Mild cerebral atrophy again noted. Bilateral basal ganglia punctate calcifications are again noted. Stable mild periventricular chronic white matter disease. No acute cortical infarction. No mass lesion is noted on this unenhanced scan. Vascular: Mild atherosclerotic calcifications of carotid siphon again noted. Skull: No skull fracture is noted. Sinuses/Orbits: Paranasal sinuses and mastoid air cells are unremarkable. Other: None IMPRESSION: No acute intracranial abnormality. Stable mild cerebral atrophy and chronic white matter disease. No definite acute cortical infarction. Electronically Signed   By: Lahoma Crocker M.D.   On: 01/11/2016 16:53   US Scrotum  01/11/2016  CLINICAL DATA:  70 year old presenting with bilateral testicular pain and tenderness. EXAM: SCROTAL ULTRASOUND DOPPLER ULTRASOUND OF THE TESTICLES TECHNIQUE: Complete ultrasound examination of the testicles, epididymis, and other scrotal structures was performed. Color and spectral Doppler ultrasound were also utilized to evaluate blood flow to the testicles. COMPARISON:  None. FINDINGS: Right testicle Measurements: Approximately 4.7 x 2.3 x 3.6 cm. Normal parenchymal echotexture without mass or microlithiasis. Normal color Doppler flow without evidence of hyperemia. Left testicle Measurements: Approximately 5.1 x 2.7 x 3.2 cm. Normal parenchymal echotexture without evidence of mass. Microcalcifications throughout the left testicle. Hyperemia on color Doppler evaluation. Right epididymis: Normal in size and appearance without evidence of hyperemia.  Left epididymis: 3 adjacent complex cysts/spermatoceles measuring approximately 5 mm, 10 mm and 8 mm respectively involving the epididymal head. Hyperemia on color Doppler evaluation involving the entire epididymis. Hydrocele:  Absent bilaterally. Varicocele:  Absent bilaterally. Pulsed Doppler interrogation of both testes demonstrates normal low resistance arterial and venous waveforms bilaterally. IMPRESSION: 1. Left epididymo-orchitis. 2. Microlithiasis involving the left testicle. 3. Three adjacent complex cysts/spermatoceles involving the left epididymal head. Electronically Signed   By: Evangeline Dakin M.D.   On: 01/11/2016 18:38   [place] Art/ven Flow Abd Pelv Doppler  01/11/2016  CLINICAL DATA:  70 year old presenting with bilateral testicular pain and tenderness. EXAM: SCROTAL ULTRASOUND DOPPLER ULTRASOUND OF THE TESTICLES TECHNIQUE: Complete ultrasound examination of the testicles, epididymis, and other scrotal structures was performed. Color and spectral Doppler ultrasound were also utilized to evaluate blood flow to the testicles. COMPARISON:  None. FINDINGS: Right testicle Measurements: Approximately 4.7 x 2.3 x 3.6 cm. Normal parenchymal echotexture without mass or microlithiasis. Normal color Doppler flow without evidence of hyperemia. Left testicle Measurements: Approximately 5.1 x 2.7 x 3.2 cm. Normal parenchymal echotexture without evidence of mass. Microcalcifications throughout the left testicle. Hyperemia on color Doppler evaluation. Right epididymis: Normal in size and appearance without evidence of hyperemia. Left epididymis: 3 adjacent complex cysts/spermatoceles measuring approximately 5 mm, 10 mm and 8 mm respectively involving the epididymal head. Hyperemia on color Doppler evaluation involving the entire epididymis. Hydrocele:  Absent bilaterally. Varicocele:  Absent bilaterally. Pulsed Doppler interrogation of both testes demonstrates normal low resistance arterial and venous waveforms  bilaterally. IMPRESSION: 1. Left epididymo-orchitis. 2. Microlithiasis involving the left testicle. 3. Three adjacent complex cysts/spermatoceles involving the left epididymal head. Electronically Signed   By: Evangeline Dakin M.D.   On: 01/11/2016 18:38   Dg Chest Port 1 View  01/11/2016  CLINICAL DATA:  Sepsis.  Altered mental status. EXAM: PORTABLE CHEST 1 VIEW COMPARISON:  10/08/2015 and 10/12/2015 FINDINGS: The heart size and pulmonary vascularity are normal and the lungs are clear except for minimal atelectasis at the left base posterior medially. No bone abnormality. IMPRESSION: Minimal left base atelectasis.  Otherwise, normal exam. Electronically Signed   By: Lorriane Shire M.D.   On: 01/11/2016 14:54    Scheduled Meds: . aspirin EC  81 mg Oral q morning - 10a  . aspirin  150 mg Rectal Daily  . aztreonam  1 g Intravenous Q8H  . enoxaparin (LOVENOX) injection  80 mg Subcutaneous Q24H  . escitalopram  10 mg Oral q morning - 10a  . feeding supplement (PRO-STAT SUGAR FREE 64)  30 mL Oral TID WC  . furosemide  40 mg Oral BID  . gabapentin  100 mg Oral BID  . hydrOXYzine  25 mg Oral BID  . insulin aspart  0-9 Units Subcutaneous TID WC  . insulin NPH  Human  20 Units Subcutaneous BID AC & HS  . metoprolol  12.5 mg Oral BID  . mupirocin cream   Topical BID  . pantoprazole (PROTONIX) IV  40 mg Intravenous Q24H  . rOPINIRole  0.5 mg Oral QHS  . senna-docusate  2 tablet Oral QHS  . sodium chloride flush  3 mL Intravenous Q12H  . [START ON 01/13/2016] vancomycin  1,500 mg Intravenous Q12H  . vitamin C  500 mg Oral BID   Continuous Infusions:     Time spent: 25 minutes    Konstance Happel, [place]  [facility] Pager [phone]. If 7PM-7AM, please contact night-coverage at [email or web address], password [name] 01/12/2016, 2:28 PM

## 2016-01-12 NOTE — Progress Notes (Signed)
OT Cancellation Note  Patient Details Name: JAIDYNN ESPER MRN: [id number] DOB: [birth year]   Cancelled Treatment:     Noted pt is from SNF Will defer OT eval to SNF    [place], Thereasa Parkin 01/12/2016, 12:19 PM

## 2016-01-12 NOTE — Care Management Obs Status (Signed)
[place] NOTIFICATION   Patient Details  Name: UILLIAM KAZMER MRN: [id number] Date of Birth: February 25, 1946   Medicare Observation Status Notification Given:  Yes    Carles Collet, RN 01/12/2016, 1:15 PM

## 2016-01-12 NOTE — Progress Notes (Signed)
Inpatient Diabetes Program Recommendations  AACE/ADA: New Consensus Statement on Inpatient Glycemic Control (2015)  Target Ranges:  Prepandial:   less than 140 mg/dL      Peak postprandial:   less than 180 mg/dL (1-2 hours)      Critically ill patients:  140 - 180 mg/dL  Results for [name], [name] (MRN [id number]) as of 01/12/2016 11:29  Ref. Range 01/11/2016 21:09 01/12/2016 08:05  Glucose-Capillary Latest Ref Range: 65-99 mg/dL 287 (H) 214 (H)  Results for [name], [name] (MRN [id number]) as of 01/12/2016 11:29  Ref. Range 01/11/2016 14:42 01/12/2016 05:30  Glucose Latest Ref Range: 65-99 mg/dL 255 (H) 238 (H)  Results for [name], [name] (MRN [id number]) as of 01/12/2016 11:29  Ref. Range 09/10/2015 04:41  Hemoglobin A1C Latest Ref Range: 4.8-5.6 % 10.4 (H)   Review of Glycemic Control  Diabetes history: DM2 Outpatient Diabetes medications: NPH 28 units BID Current orders for Inpatient glycemic control: NPH 20 units BID, Novolog 0-9 units TID with meals  Inpatient Diabetes Program Recommendations: Insulin - Basal: Patiet received NPH 20 units last night and fasting glucose is 214 mg/dl this morning. Please consider increasing NPH to 24 units BID. Correction (SSI): Please consider increasing Novolog correction to Resistant scale and adding BEDTIME correction scale. A1C: Please consider ordering an A1C to evaluate glycemic control over the past 2-3 months.  Thanks, Barnie Alderman, RN, MSN, CDE Diabetes Coordinator Inpatient Diabetes Program [phone] (Team Pager from [place] to [place]) [phone] ([place] office) [phone] [facility] office) [phone] [facility] office)

## 2016-01-12 NOTE — NC FL2 (Signed)
[place] MEDICAID FL2 LEVEL OF CARE SCREENING TOOL     IDENTIFICATION  Patient Name: [name] Birthdate: 11-Oct-1945 Sex: male Admission Date (Current Location): 01/11/2016  [facility] and [place] Number:  Herbalist and Address:  [facility], [place] [address], [place], [place] [address]      Provider Number: [id number]  Attending Physician Name and Address:  Louellen Molder, MD  Relative Name and Phone Number:       Current Level of Care: Hospital Recommended Level of Care: [place] Prior Approval Number:    Date Approved/Denied:   PASRR Number:    Discharge Plan: SNF    Current Diagnoses: Patient Active Problem List   Diagnosis Date Noted  . Acute encephalopathy 01/11/2016  . Sepsis ([place]) 01/11/2016  . UTI (lower urinary tract infection) 01/11/2016  . Epididymo-orchitis 01/11/2016  . Depression   . Rash and nonspecific skin eruption 11/07/2015  . GERD (gastroesophageal reflux disease) 10/22/2015  . Restless legs 10/22/2015  . Chronic venous stasis dermatitis of both lower extremities 10/08/2015  . CAD in native artery 09/15/2015  . PVC's (premature ventricular contractions) 09/15/2015  . Sinus pause 09/14/2015  . Morbid obesity ([place]) 07/03/2015  . Type II diabetes mellitus with peripheral circulatory disorder, uncontrolled ([place]) 05/09/2015  . Hyperlipidemia LDL goal <70 05/02/2015  . Abnormal nuclear stress test 05/02/2015  . Neurogenic bladder 04/26/2015  . Pressure ulcer 04/15/2015  . UTI (urinary tract infection) due to urinary indwelling catheter ([place]) 04/14/2015  . History of DVT (deep vein thrombosis) 12/08/2014  . Chronic diastolic CHF (congestive heart failure) ([place]) 04/01/2014  . Chronic indwelling Foley catheter 12/23/2013  . CVA (cerebral infarction) 03/11/2012  . NSVT (nonsustained ventricular tachycardia) ([place]) 10/26/2011  . Diabetic neuropathy ([place]) 10/10/2011  . OSA (obstructive sleep apnea)  10/10/2011    Orientation RESPIRATION BLADDER Height & Weight     Self, Time, Situation, Place  Normal Continent Weight: (!) 341 lb 11.4 oz (155 kg) Height:  6\' 3"  (190.5 cm)  BEHAVIORAL SYMPTOMS/MOOD NEUROLOGICAL BOWEL NUTRITION STATUS      Continent Diet  AMBULATORY STATUS COMMUNICATION OF NEEDS Skin   Total Care Verbally  (Deep Tissue Injury- heel)                       Personal Care Assistance Level of Assistance  Bathing, Dressing Bathing Assistance: Maximum assistance   Dressing Assistance: Maximum assistance     Functional Limitations Info             SPECIAL CARE FACTORS FREQUENCY  PT (By licensed PT), OT (By licensed OT)     PT Frequency: 5/wk OT Frequency: 5/wk            Contractures      Additional Factors Info  Code Status, Allergies, Psychotropic, Insulin Sliding Scale, Isolation Precautions Code Status Info: DNR Allergies Info: Ace Inhibitors, Lipitor, Metformin And Related, Cefadroxil, Cephalexin, Morphine And Related, Robaxin   Insulin Sliding Scale Info: 5/day Isolation Precautions Info: MRSA     Current Medications (01/12/2016):  This is the current hospital active medication list Current Facility-Administered Medications  Medication Dose Route Frequency Provider Last Rate Last Dose  . acetaminophen (TYLENOL) tablet 650 mg  650 mg Oral Q6H PRN Ivor Costa, MD   650 mg at 01/12/16 1057   Or  . acetaminophen (TYLENOL) suppository 650 mg  650 mg Rectal Q6H PRN Ivor Costa, MD   650 mg at 01/11/16 2259  .  aspirin EC tablet 81 mg  81 mg Oral q morning - 10a Nishant Dhungel, MD   81 mg at 01/12/16 1059  . aspirin suppository 150 mg  150 mg Rectal Daily Ivor Costa, MD   150 mg at 01/12/16 0843  . aztreonam (AZACTAM) 1 g in dextrose 5 % 50 mL IVPB  1 g Intravenous Q8H Wynell Balloon, RPH   1 g at 01/12/16 1320  . enoxaparin (LOVENOX) injection 80 mg  80 mg Subcutaneous Q24H Ivor Costa, MD   80 mg at 01/11/16 2307  . escitalopram (LEXAPRO) tablet 10  mg  10 mg Oral q morning - 10a Nishant Dhungel, MD   10 mg at 01/12/16 1057  . feeding supplement (PRO-STAT SUGAR FREE 64) liquid 30 mL  30 mL Oral TID WC Nishant Dhungel, MD   30 mL at 01/12/16 1059  . furosemide (LASIX) tablet 40 mg  40 mg Oral BID Nishant Dhungel, MD      . gabapentin (NEURONTIN) capsule 100 mg  100 mg Oral BID Nishant Dhungel, MD   100 mg at 01/12/16 1058  . hydrOXYzine (ATARAX/VISTARIL) tablet 25 mg  25 mg Oral BID Nishant Dhungel, MD   25 mg at 01/12/16 1057  . insulin aspart (novoLOG) injection 0-9 Units  0-9 Units Subcutaneous TID WC Ivor Costa, MD   2 Units at 01/12/16 1219  . insulin NPH Human (HUMULIN N,NOVOLIN N) injection 20 Units  20 Units Subcutaneous BID AC & HS Ivor Costa, MD   20 Units at 01/12/16 [phone]  . metoprolol tartrate (LOPRESSOR) tablet 12.5 mg  12.5 mg Oral BID Nishant Dhungel, MD   12.5 mg at 01/12/16 1105  . mupirocin cream (BACTROBAN) 2 %   Topical BID Ivor Costa, MD      . nitroGLYCERIN (NITROSTAT) SL tablet 0.4 mg  0.4 mg Sublingual Q5 min PRN Nishant Dhungel, MD      . ondansetron (ZOFRAN) injection 4 mg  4 mg Intravenous Q8H PRN Ivor Costa, MD      . pantoprazole (PROTONIX) injection 40 mg  40 mg Intravenous Q24H Ivor Costa, MD   40 mg at 01/11/16 2256  . rOPINIRole (REQUIP) tablet 0.5 mg  0.5 mg Oral QHS Nishant Dhungel, MD      . senna-docusate (Senokot-S) tablet 2 tablet  2 tablet Oral QHS Nishant Dhungel, MD      . sodium chloride flush (NS) 0.9 % injection 3 mL  3 mL Intravenous Q12H Ivor Costa, MD   3 mL at 01/11/16 2300  . [START ON 01/13/2016] vancomycin (VANCOCIN) 1,500 mg in sodium chloride 0.9 % 500 mL IVPB  1,500 mg Intravenous Q12H Romona Curls, RPH      . vitamin C (ASCORBIC ACID) tablet 500 mg  500 mg Oral BID Nishant Dhungel, MD   500 mg at 01/12/16 1058     Discharge Medications: Please see discharge summary for a list of discharge medications.  Relevant Imaging Results:  Relevant Lab Results:   Additional Information SS#:  [id number]  [place] Mon, [place]

## 2016-01-12 NOTE — Clinical Social Work Note (Signed)
Clinical Social Work Assessment  Patient Details  Name: [name] MRN: [id number] Date of Birth: August 26, 1946  Date of referral:  01/12/16               Reason for consult:  Facility Placement                Permission sought to share information with:  Chartered certified accountant granted to share information::  Yes, Verbal Permission Granted  Name::        Agency::  [facility]  Relationship::     Contact Information:     Housing/Transportation Living arrangements for the past 2 months:  [place] Information:  Patient Patient Interpreter Needed:  None Criminal Activity/Legal Involvement Pertinent to Current Situation/Hospitalization:  No - Comment as needed Significant Relationships:  Friend, Other Family Members Lives with:  Facility Resident, Self Do you feel safe going back to the place where you live?  Yes Need for family participation in patient care:  No (Coment)  Care giving concerns:  None at this time- pt has been at [facility] for several months and expresses no concerns about current care   Social Worker assessment / plan:  [name] spoke with pt concerning plan for time of DC.  Employment status:  Retired Forensic scientist:  Medicare PT Recommendations:  Not assessed at this time [place] / Referral to community resources:  [place]  Patient/Family's Response to care: Pt is agreeable to returning to [facility] when stable for DC.  Patient/Family's Understanding of and Emotional Response to Diagnosis, Current Treatment, and Prognosis:  Pt did not have any questions or concerns at this time- hopeful he can return back to [facility] soon.  Emotional Assessment Appearance:  Appears stated age Attitude/Demeanor/Rapport:    Affect (typically observed):  Appropriate, Pleasant Orientation:  Oriented to Situation, Oriented to Place, Oriented to Self Alcohol / Substance use:  Not Applicable Psych  involvement (Current and /or in the community):  No (Comment)  Discharge Needs  Concerns to be addressed:  Care Coordination Readmission within the last 30 days:  No Current discharge risk:  Physical Impairment Barriers to Discharge:  Continued Medical Work up   [employer], LCSW 01/12/2016, 1:50 PM

## 2016-01-12 NOTE — Consult Note (Signed)
WOC wound consult note Reason for Consult: Consult requested for buttocks.  Pt has red macerated skin which is beginning to dry and peel in patchy areas.  Appearance is consistent with moisture associated skin damage from previous incontinence. Right heel with dark purple area of deep tissue injury; .5X.5cm with intact skin. Pressure Ulcer POA: Yes Measurement:Affected area approx 20X20cm across bilat buttocks Dressing procedure/placement/frequency: Barrier cream to repel moisture and promote healing.  Float heels to reduce pressure. Please re-consult if further assistance is needed.  Thank-you,  Julien Girt MSN, [place], [place], [place], [place]

## 2016-01-12 NOTE — Progress Notes (Signed)
[name] living representative Tammy brought pt's dentures and two pairs of glasses. Pt had dentures in and one pair of glasses on and one on the bedside table.  Dorita Fray 01/12/2016 1:27 PM

## 2016-01-13 LAB — GLUCOSE, CAPILLARY
GLUCOSE-CAPILLARY: 216 mg/dL — AB (ref 65–99)
GLUCOSE-CAPILLARY: 202 mg/dL — AB (ref 65–99)
Glucose-Capillary: 208 mg/dL — ABNORMAL HIGH (ref 65–99)
Glucose-Capillary: 265 mg/dL — ABNORMAL HIGH (ref 65–99)

## 2016-01-13 LAB — BASIC METABOLIC PANEL
ANION GAP: 13 (ref 5–15)
BUN: 15 mg/dL (ref 6–20)
CALCIUM: 8.2 mg/dL — AB (ref 8.9–10.3)
CO2: 23 mmol/L (ref 22–32)
CREATININE: 0.76 mg/dL (ref 0.61–1.24)
Chloride: 100 mmol/L — ABNORMAL LOW (ref 101–111)
Glucose, Bld: 228 mg/dL — ABNORMAL HIGH (ref 65–99)
Potassium: 4 mmol/L (ref 3.5–5.1)
Sodium: 136 mmol/L (ref 135–145)

## 2016-01-13 LAB — CBC
HCT: 36.5 % — ABNORMAL LOW (ref 39.0–52.0)
Hemoglobin: 11.8 g/dL — ABNORMAL LOW (ref 13.0–17.0)
MCH: 29.4 pg (ref 26.0–34.0)
MCHC: 32.3 g/dL (ref 30.0–36.0)
MCV: 90.8 fL (ref 78.0–100.0)
PLATELETS: 203 10*3/uL (ref 150–400)
RBC: 4.02 MIL/uL — ABNORMAL LOW (ref 4.22–5.81)
RDW: 13.3 % (ref 11.5–15.5)
WBC: 13.3 10*3/uL — AB (ref 4.0–10.5)

## 2016-01-13 MED ORDER — INSULIN NPH (HUMAN) (ISOPHANE) 100 UNIT/ML ~~LOC~~ SUSP
26.0000 [IU] | Freq: Two times a day (BID) | SUBCUTANEOUS | Status: DC
  Administered 2016-01-13 – 2016-01-16 (×6): 26 [IU] via SUBCUTANEOUS

## 2016-01-13 MED ORDER — PANTOPRAZOLE SODIUM 40 MG PO TBEC
40.0000 mg | DELAYED_RELEASE_TABLET | Freq: Every day | ORAL | Status: DC
  Administered 2016-01-13 – 2016-01-15 (×3): 40 mg via ORAL
  Filled 2016-01-13 (×3): qty 1

## 2016-01-13 NOTE — Progress Notes (Addendum)
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 Patient does not remember the incident much says he was having pain in his testes for the last few days. As per admission H&P he was normal on the morning of admission but was later found to be confused and minimally responsive. In the ED he was found to be septic with a low-grade fever, WBC of 19.6 K, tachycardic and tachypnea. UA was positive for UTI and on exam had erythema with swelling of the scrotum. Ultrasound of the scrotum showed left epididymoorchitis, microlithiasis involving the stented vessels and 3 adjacent  complex cysts/spermatoceles involving left epididymal head. Patient admitted for sepsis due to UTI and epididymoorchitis. Sepsis pathway initiated in the ED.   Assessment/Plan: Sepsis secondary to catheter associated UTI and epididymoorchitis Sepsis resolved.. Empiric vancomycin and aztreonam.  - Reportedly Foley catheter change in the ED. (As per patient catheter should have been changed every month but was not changed in the past 3 months). -Follow urine culture and blood culture.  -Minimal pain in scrotum. (Erythema and swelling improved on exam today). when necessary Ultracet for pain.  Acute encephalopathy Secondary to sepsis. Now resolved.   History of stroke Patient reports being nonambulatory. PT to follow regarding upper body strength activities. Resume aspirin and beta blocker.   Chronic diastolic CHF Euvolemic. Continue aspirin, beta blocker and Lasix.   Chronic hip pain Reports shooting pain down to the legs and is  chronic. Improved with Neurontin and Ultracet.  Hyperkalemia Resolved with Kayexalate. Stable on monitor.  Uncontrolled type 2 diabetes mellitus Last A1c of 10.4. Increase home dose NPH to 26 units twice a day . Continue sliding scale coverage.  GERD Continue PPI  Pressure ulcer 2x2 cm across bilateral buttocks, noninfected. Also has right heel ulcer 0.5x0.5cm. appreciate wound care recommendations.    Coronary artery disease Continue aspirin and beta blockers  Depression Continue home medications      Code Status: DO NOT RESUSCITATE Family Communication: None at bedside  Disposition Plan: Return to skilled nursing facility possibly on 5/8 continues to improve  Consultants:  None  Procedures:  Ultrasound scrotum  Antibiotics:  IV vancomycin and aztreonam since 5/4-  HPI/Subjective: Seen and examined. Appears alert and oriented.  Objective: Filed Vitals:   01/13/16 0537 01/13/16 0900  BP: 137/70 134/76  Pulse: 81 81  Temp: 98.3 F (36.8 C)   Resp: 20     Intake/Output Summary (Last 24 hours) at 01/13/16 1156 Last data filed at 01/13/16 0930  Gross per 24 hour  Intake    690 ml  Output    850 ml  Net   -160 ml   Filed Weights   01/11/16 2104  Weight: 155 kg (341 lb 11.4 oz)    Exam:   General:  Elderly morbidly obese male not in distress  HEENT: No pallor, moist mucosa, supple neck  Chest: Clear bilaterally  CVS: Normal S1 and S2, no murmurs rub or gallop  Abdomen: Soft, nondistended, nontender, chronic indwelling Foley, erythema and swelling of the scrotum(Improved, minimal tenderness)  Musculoskeletal: Warm, no edema  CNS: Alert and oriented  Data Reviewed: Basic Metabolic Panel:  Recent Labs Lab 01/11/16 1442 01/12/16 0530 01/13/16 0537  NA 132* 133* 136  K 4.7 5.3* 4.0  CL 99* 101 100*  CO2 23 23 23   GLUCOSE 255* 238* 228*  BUN 15 15 15   CREATININE 0.67 0.67 0.76  CALCIUM 8.7* 8.2* 8.2*   Liver Function  Tests:  Recent Labs Lab 01/11/16 1442  AST 17  ALT 13*  ALKPHOS 102  BILITOT 0.6  PROT 6.4*  ALBUMIN 2.9*   No results for input(s): LIPASE, AMYLASE in the last 168 hours. No results for input(s): AMMONIA in the last 168 hours. CBC:  Recent Labs Lab 01/11/16 1442 01/12/16 0530 01/13/16 0537  WBC 19.6* 18.5* 13.3*  NEUTROABS 16.8*  --   --   HGB 13.6 12.8* 11.8*  HCT 40.1 37.8* 36.5*  MCV 87.6 90.6 90.8  PLT 232 186 203   Cardiac Enzymes: No results for input(s): CKTOTAL, CKMB, CKMBINDEX, TROPONINI in the last 168 hours. BNP (last 3 results)  Recent Labs  07/02/15 1439 09/23/15 0521 01/12/16 0530  BNP 320.3* 130.9* 201.1*    ProBNP (last 3 results) No results for input(s): PROBNP in the last 8760 hours.  CBG:  Recent Labs Lab 01/12/16 1155 01/12/16 1645 01/12/16 2156 01/13/16 0835 01/13/16 1152  GLUCAP 163* 166* 193* 216* 202*    Recent Results (from the past 240 hour(s))  Blood Culture (routine x 2)     Status: None (Preliminary result)   Collection Time: 01/11/16  2:42 PM  Result Value Ref Range Status   Specimen Description BLOOD RIGHT WRIST  Final   Special Requests BOTTLES DRAWN AEROBIC AND ANAEROBIC 5CC  Final   Culture NO GROWTH < 24 HOURS  Final   Report Status PENDING  Incomplete  Blood Culture (routine x 2)     Status: None (Preliminary result)   Collection Time: 01/11/16  2:54 PM  Result Value Ref Range Status   Specimen Description BLOOD LEFT HAND  Final   Special Requests BOTTLES DRAWN AEROBIC ONLY 5CC  Final   Culture NO GROWTH < 24 HOURS  Final   Report Status PENDING  Incomplete  Urine culture     Status: None (Preliminary result)   Collection Time: 01/11/16  2:55 PM  Result Value Ref Range Status   Specimen Description URINE, CLEAN CATCH  Final   Special Requests NONE  Final   Culture TOO YOUNG TO READ  Final   Report Status PENDING  Incomplete  MRSA PCR Screening     Status: None   Collection Time: 01/11/16 10:32 PM   Result Value Ref Range Status   MRSA by PCR NEGATIVE NEGATIVE Final    Comment:        The GeneXpert MRSA Assay (FDA approved for NASAL specimens only), is one component of a comprehensive MRSA colonization surveillance program. It is not intended to diagnose MRSA infection nor to guide or monitor treatment for MRSA infections.      Studies: Ct Head Wo Contrast  01/11/2016  CLINICAL DATA:  Altered mental status, dysphagia EXAM: CT HEAD WITHOUT CONTRAST TECHNIQUE: Contiguous axial images were obtained from the base of the skull through the vertex without intravenous contrast. COMPARISON:  09/22/2015 FINDINGS: Brain: No intracranial hemorrhage, mass effect or midline shift. Mild cerebral atrophy again noted. Bilateral basal ganglia punctate calcifications are again noted. Stable mild periventricular chronic white matter disease. No acute cortical infarction. No mass lesion is noted on this unenhanced scan. Vascular: Mild atherosclerotic calcifications of carotid siphon again noted. Skull: No skull fracture is noted. Sinuses/Orbits: Paranasal sinuses and mastoid  air cells are unremarkable. Other: None IMPRESSION: No acute intracranial abnormality. Stable mild cerebral atrophy and chronic white matter disease. No definite acute cortical infarction. Electronically Signed   By: Lahoma Crocker M.D.   On: 01/11/2016 16:53   US Scrotum  01/11/2016  CLINICAL DATA:  70 year old presenting with bilateral testicular pain and tenderness. EXAM: SCROTAL ULTRASOUND DOPPLER ULTRASOUND OF THE TESTICLES TECHNIQUE: Complete ultrasound examination of the testicles, epididymis, and other scrotal structures was performed. Color and spectral Doppler ultrasound were also utilized to evaluate blood flow to the testicles. COMPARISON:  None. FINDINGS: Right testicle Measurements: Approximately 4.7 x 2.3 x 3.6 cm. Normal parenchymal echotexture without mass or microlithiasis. Normal color Doppler flow without evidence of  hyperemia. Left testicle Measurements: Approximately 5.1 x 2.7 x 3.2 cm. Normal parenchymal echotexture without evidence of mass. Microcalcifications throughout the left testicle. Hyperemia on color Doppler evaluation. Right epididymis: Normal in size and appearance without evidence of hyperemia. Left epididymis: 3 adjacent complex cysts/spermatoceles measuring approximately 5 mm, 10 mm and 8 mm respectively involving the epididymal head. Hyperemia on color Doppler evaluation involving the entire epididymis. Hydrocele:  Absent bilaterally. Varicocele:  Absent bilaterally. Pulsed Doppler interrogation of both testes demonstrates normal low resistance arterial and venous waveforms bilaterally. IMPRESSION: 1. Left epididymo-orchitis. 2. Microlithiasis involving the left testicle. 3. Three adjacent complex cysts/spermatoceles involving the left epididymal head. Electronically Signed   By: Evangeline Dakin M.D.   On: 01/11/2016 18:38   [place] Art/ven Flow Abd Pelv Doppler  01/11/2016  CLINICAL DATA:  71 year old presenting with bilateral testicular pain and tenderness. EXAM: SCROTAL ULTRASOUND DOPPLER ULTRASOUND OF THE TESTICLES TECHNIQUE: Complete ultrasound examination of the testicles, epididymis, and other scrotal structures was performed. Color and spectral Doppler ultrasound were also utilized to evaluate blood flow to the testicles. COMPARISON:  None. FINDINGS: Right testicle Measurements: Approximately 4.7 x 2.3 x 3.6 cm. Normal parenchymal echotexture without mass or microlithiasis. Normal color Doppler flow without evidence of hyperemia. Left testicle Measurements: Approximately 5.1 x 2.7 x 3.2 cm. Normal parenchymal echotexture without evidence of mass. Microcalcifications throughout the left testicle. Hyperemia on color Doppler evaluation. Right epididymis: Normal in size and appearance without evidence of hyperemia. Left epididymis: 3 adjacent complex cysts/spermatoceles measuring approximately 5 mm, 10 mm and  8 mm respectively involving the epididymal head. Hyperemia on color Doppler evaluation involving the entire epididymis. Hydrocele:  Absent bilaterally. Varicocele:  Absent bilaterally. Pulsed Doppler interrogation of both testes demonstrates normal low resistance arterial and venous waveforms bilaterally. IMPRESSION: 1. Left epididymo-orchitis. 2. Microlithiasis involving the left testicle. 3. Three adjacent complex cysts/spermatoceles involving the left epididymal head. Electronically Signed   By: Evangeline Dakin M.D.   On: 01/11/2016 18:38   Dg Chest Port 1 View  01/11/2016  CLINICAL DATA:  Sepsis.  Altered mental status. EXAM: PORTABLE CHEST 1 VIEW COMPARISON:  10/08/2015 and 10/12/2015 FINDINGS: The heart size and pulmonary vascularity are normal and the lungs are clear except for minimal atelectasis at the left base posterior medially. No bone abnormality. IMPRESSION: Minimal left base atelectasis.  Otherwise, normal exam. Electronically Signed   By: Lorriane Shire M.D.   On: 01/11/2016 14:54    Scheduled Meds: . aspirin EC  81 mg Oral q morning - 10a  . aztreonam  1 g Intravenous Q8H  . enoxaparin (LOVENOX) injection  80 mg Subcutaneous Q24H  . escitalopram  10 mg Oral q morning - 10a  . feeding supplement (PRO-STAT SUGAR FREE 64)  30 mL Oral TID WC  .  furosemide  40 mg Oral BID  . gabapentin  100 mg Oral BID  . hydrOXYzine  25 mg Oral BID  . insulin aspart  0-9 Units Subcutaneous TID WC  . insulin NPH Human  20 Units Subcutaneous BID AC & HS  . metoprolol  12.5 mg Oral BID  . mupirocin cream   Topical BID  . pantoprazole (PROTONIX) IV  40 mg Intravenous Q24H  . rOPINIRole  0.5 mg Oral QHS  . senna-docusate  2 tablet Oral QHS  . sodium chloride flush  3 mL Intravenous Q12H  . vancomycin  1,500 mg Intravenous Q12H  . vitamin C  500 mg Oral BID   Continuous Infusions:     Time spent: 25 minutes    Heily Carlucci, [place]  [facility] Pager [phone]. If 7PM-7AM, please contact  night-coverage at [email or web address], password [facility] 01/13/2016, 11:56 AM  LOS: 1 day

## 2016-01-13 NOTE — Progress Notes (Signed)
Physical Therapy Treatment Patient Details Name: [name] MRN: [id number] DOB: 01-25-1946 Today's Date: 01/13/2016    History of Present Illness 70 year old morbidly obese male with history of stroke, mostly nonambulatory, hypertension, hyperlipidemia, diabetes mellitus, depression, chronic indwelling Foley, CAD, diastolic CHF, DVT not on anticoagulation, OSA (not on CPAP), chronic back pain presented with scrotal pain and altered mental status.  Found positive for UTI/sepsis.    PT Comments    Patient educated on positioning and led through UE and LE therex. Current plan remains appropriate.   Follow Up Recommendations  No PT follow up     Equipment Recommendations  None recommended by PT    Recommendations for Other Services       Precautions / Restrictions Precautions Precautions: Fall Restrictions Weight Bearing Restrictions: No    Mobility  Bed Mobility               General bed mobility comments: educated pt on positioning  Transfers                    Ambulation/Gait                 Stairs            Wheelchair Mobility    Modified Rankin (Stroke Patients Only)       Balance                                    Cognition Arousal/Alertness: Awake/alert Behavior During Therapy: WFL for tasks assessed/performed Overall Cognitive Status: Within Functional Limits for tasks assessed                      Exercises General Exercises - Upper Extremity Shoulder Flexion: AROM;Both;20 reps;Theraband Shoulder ABduction: AROM;Both;20 reps;Theraband Shoulder Horizontal ABduction: AROM;Both;20 reps;Theraband Shoulder Horizontal ADduction: AROM;Both;20 reps;Theraband Elbow Flexion: AROM;Both;20 reps;Theraband General Exercises - Lower Extremity Ankle Circles/Pumps: AROM;Both;20 reps Gluteal Sets: AROM;Both;20 reps Heel Slides: AROM;Both;20 reps Hip ABduction/ADduction: AROM;Both;20 reps Straight Leg  Raises: AROM;Both;20 reps    General Comments        Pertinent Vitals/Pain Pain Assessment: Faces Faces Pain Scale: Hurts a little bit Pain Location: R LE and hand Pain Descriptors / Indicators: Sore Pain Intervention(s): Limited activity within patient's tolerance;Monitored during session;Premedicated before session;Repositioned    Home Living                      Prior Function            PT Goals (current goals can now be found in the care plan section) Acute Rehab PT Goals Patient Stated Goal: To help strength Progress towards PT goals: Progressing toward goals    Frequency  Min 2X/week    PT Plan Current plan remains appropriate    Co-evaluation             End of Session   Activity Tolerance: Patient tolerated treatment well Patient left: in bed;with call bell/phone within reach;with bed alarm set     Time: [id number] PT Time Calculation (min) (ACUTE ONLY): 38 min  Charges:  $Therapeutic Exercise: 23-37 mins $Therapeutic Activity: 8-22 mins                    G Codes:      [place] April, PTA Pager: [phone]   01/13/2016, 4:27 PM

## 2016-01-14 DIAGNOSIS — L8990 Pressure ulcer of unspecified site, unspecified stage: Secondary | ICD-10-CM

## 2016-01-14 LAB — URINE CULTURE

## 2016-01-14 LAB — GLUCOSE, CAPILLARY
GLUCOSE-CAPILLARY: 149 mg/dL — AB (ref 65–99)
GLUCOSE-CAPILLARY: 310 mg/dL — AB (ref 65–99)
Glucose-Capillary: 206 mg/dL — ABNORMAL HIGH (ref 65–99)
Glucose-Capillary: 86 mg/dL (ref 65–99)

## 2016-01-14 MED ORDER — ERTAPENEM SODIUM 1 G IJ SOLR
1.0000 g | INTRAMUSCULAR | Status: DC
Start: 2016-01-14 — End: 2016-01-16
  Administered 2016-01-14 – 2016-01-15 (×2): 1 g via INTRAVENOUS
  Filled 2016-01-14 (×3): qty 1

## 2016-01-14 MED ORDER — MUPIROCIN 2 % EX OINT
TOPICAL_OINTMENT | CUTANEOUS | Status: AC
  Administered 2016-01-14: 11:00:00
  Filled 2016-01-14: qty 22

## 2016-01-14 MED ORDER — SODIUM CHLORIDE 0.9 % IV BOLUS (SEPSIS): 1000.0000 mL | Freq: Once | INTRAVENOUS | Status: DC

## 2016-01-14 NOTE — Progress Notes (Signed)
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 Patient does not remember the incident much says he was having pain in his testes for the last few days. As per admission H&P he was normal on the morning of admission but was later found to be confused and minimally responsive. In the ED he was found to be septic with a low-grade fever, WBC of 19.6 K, tachycardic and tachypnea. UA was positive for UTI and on exam had erythema with swelling of the scrotum. Ultrasound of the scrotum showed left epididymoorchitis, microlithiasis involving the stented vessels and 3 adjacent  complex cysts/spermatoceles involving left epididymal head. Patient admitted for sepsis due to UTI and epididymoorchitis. Sepsis pathway initiated in the ED.   Assessment/Plan: Sepsis secondary to catheter associated UTI and epididymoorchitis Sepsis resolved.. Empiric vancomycin and aztreonam.  - Reportedly Foley catheter change in the ED. (As per patient catheter should have been changed every month but was not changed in the past 3 months). -- Blood cultures NGTD -- Urine culture growing ESBL E coli and Klebsiella--Transition to IV ertapenem now.  -- Analgesics as needed.  Acute encephalopathy Secondary to sepsis.  Now resolved.   History of stroke Patient reports being nonambulatory due to "passing out when I stand up."  Previously ambulated with a walker but reports that he is being moved off the rehab floor at his SNF facility because he has been unable to participate.  PT following.  Mild L ICA disease documented in 2016.  Will  check orthostatics and repeat carotid dopplers now.   Patient does not seem overly concerned that he has not been walking.  Resume aspirin and beta blocker.  Chronic diastolic CHF Euvolemic. Continue aspirin, beta blocker and Lasix.  Chronic hip/back pain Reports shooting pain down to the legs and is chronic. Improved with Neurontin and Ultracet.  Uncontrolled type 2 diabetes mellitus Last A1c of 10.4. Increase home dose NPH to 26 units twice a day . Continue sliding scale coverage.  GERD Continue PPI  Pressure ulcer 2x2 cm across bilateral buttocks, noninfected. Also has right heel ulcer 0.5x0.5cm. appreciate wound care recommendations.  Coronary artery disease Continue aspirin and beta blockers  Depression Continue home medications    Code Status: DO NOT RESUSCITATE Family Communication: None at bedside  Disposition Plan: Return to skilled nursing facility possibly on 5/8   Consultants:  None  Procedures:  Ultrasound scrotum  Antibiotics:  IV vancomycin and aztreonam since 5/4-  HPI/Subjective: Seen and examined. Appears alert and oriented.  Objective: Filed Vitals:   01/13/16 2138 01/14/16 0605  BP: 138/58 148/64  Pulse: 75 71  Temp: 98.3 F (36.8 C) 97.7 F (36.5 C)  Resp: 18 18    Intake/Output Summary (Last 24 hours) at 01/14/16 1141 Last data filed at 01/14/16 1038  Gross per 24 hour  Intake    598 ml  Output   3950 ml  Net  -3352 ml   Filed Weights   01/11/16 2104  Weight: 155 kg (341 lb 11.4 oz)    Exam:   General:  Elderly morbidly obese male not in distress  HEENT: No pallor, moist mucosa, supple neck  Chest: Clear bilaterally  CVS: Normal S1 and  S2, no murmurs rub or gallop  Abdomen: Soft, nondistended, nontender, chronic indwelling Foley, erythema and swelling of the scrotum present, scrotum is tender to palpation  Musculoskeletal: Warm, no edema  CNS: Alert and oriented  Data Reviewed: Basic Metabolic Panel:  Recent  Labs Lab 01/11/16 1442 01/12/16 0530 01/13/16 0537  NA 132* 133* 136  K 4.7 5.3* 4.0  CL 99* 101 100*  CO2 23 23 23   GLUCOSE 255* 238* 228*  BUN 15 15 15   CREATININE 0.67 0.67 0.76  CALCIUM 8.7* 8.2* 8.2*   Liver Function Tests:  Recent Labs Lab 01/11/16 1442  AST 17  ALT 13*  ALKPHOS 102  BILITOT 0.6  PROT 6.4*  ALBUMIN 2.9*   CBC:  Recent Labs Lab 01/11/16 1442 01/12/16 0530 01/13/16 0537  WBC 19.6* 18.5* 13.3*  NEUTROABS 16.8*  --   --   HGB 13.6 12.8* 11.8*  HCT 40.1 37.8* 36.5*  MCV 87.6 90.6 90.8  PLT 232 186 203   BNP (last 3 results)  Recent Labs  07/02/15 1439 09/23/15 0521 01/12/16 0530  BNP 320.3* 130.9* 201.1*   CBG:  Recent Labs Lab 01/13/16 0835 01/13/16 1152 01/13/16 1646 01/13/16 2105 01/14/16 0859  GLUCAP 216* 202* 208* 265* 86    Recent Results (from the past 240 hour(s))  Blood Culture (routine x 2)     Status: None (Preliminary result)   Collection Time: 01/11/16  2:42 PM  Result Value Ref Range Status   Specimen Description BLOOD RIGHT WRIST  Final   Special Requests BOTTLES DRAWN AEROBIC AND ANAEROBIC 5CC  Final   Culture NO GROWTH 2 DAYS  Final   Report Status PENDING  Incomplete  Blood Culture (routine x 2)     Status: None (Preliminary result)   Collection Time: 01/11/16  2:54 PM  Result Value Ref Range Status   Specimen Description BLOOD LEFT HAND  Final   Special Requests BOTTLES DRAWN AEROBIC ONLY 5CC  Final   Culture NO GROWTH 2 DAYS  Final   Report Status PENDING  Incomplete  Urine culture     Status: Abnormal   Collection Time: 01/11/16  2:55 PM  Result Value Ref Range Status   Specimen Description URINE, CLEAN CATCH  Final   Special Requests NONE  Final   Culture (A)  Final    >=100,000 COLONIES/mL ESCHERICHIA COLI Confirmed Extended Spectrum Beta-Lactamase Producer (ESBL) >=100,000 COLONIES/mL KLEBSIELLA PNEUMONIAE    Report Status 01/14/2016 FINAL  Final   Organism ID, Bacteria ESCHERICHIA COLI  (A)  Final   Organism ID, Bacteria KLEBSIELLA PNEUMONIAE (A)  Final      Susceptibility   Escherichia coli - MIC*    AMPICILLIN >=32 RESISTANT Resistant     CEFAZOLIN >=64 RESISTANT Resistant     CEFTRIAXONE >=64 RESISTANT Resistant     CIPROFLOXACIN >=4 RESISTANT Resistant     GENTAMICIN <=1 SENSITIVE Sensitive     IMIPENEM <=0.25 SENSITIVE Sensitive     NITROFURANTOIN <=16 SENSITIVE Sensitive     TRIMETH/SULFA <=20 SENSITIVE Sensitive     AMPICILLIN/SULBACTAM >=32 RESISTANT Resistant     PIP/TAZO 64 INTERMEDIATE Intermediate     * >=100,000 COLONIES/mL ESCHERICHIA COLI   Klebsiella pneumoniae - MIC*    AMPICILLIN 16 RESISTANT Resistant     CEFAZOLIN <=4 SENSITIVE Sensitive     CEFTRIAXONE <=1 SENSITIVE Sensitive     CIPROFLOXACIN <=0.25 SENSITIVE Sensitive     GENTAMICIN <=1 SENSITIVE Sensitive     IMIPENEM <=0.25 SENSITIVE Sensitive  NITROFURANTOIN 32 SENSITIVE Sensitive     TRIMETH/SULFA <=20 SENSITIVE Sensitive     AMPICILLIN/SULBACTAM 4 SENSITIVE Sensitive     PIP/TAZO <=4 SENSITIVE Sensitive     * >=100,000 COLONIES/mL KLEBSIELLA PNEUMONIAE  MRSA PCR Screening     Status: None   Collection Time: 01/11/16 10:32 PM  Result Value Ref Range Status   MRSA by PCR NEGATIVE NEGATIVE Final    Comment:        The GeneXpert MRSA Assay (FDA approved for NASAL specimens only), is one component of a comprehensive MRSA colonization surveillance program. It is not intended to diagnose MRSA infection nor to guide or monitor treatment for MRSA infections.      Studies: No results found.  Scheduled Meds: . aspirin EC  81 mg Oral q morning - 10a  . aztreonam  1 g Intravenous Q8H  . enoxaparin (LOVENOX) injection  80 mg Subcutaneous Q24H  . escitalopram  10 mg Oral q morning - 10a  . feeding supplement (PRO-STAT SUGAR FREE 64)  30 mL Oral TID WC  . furosemide  40 mg Oral BID  . gabapentin  100 mg Oral BID  . hydrOXYzine  25 mg Oral BID  . insulin aspart  0-9 Units  Subcutaneous TID WC  . insulin NPH Human  26 Units Subcutaneous BID AC & HS  . metoprolol  12.5 mg Oral BID  . mupirocin cream   Topical BID  . mupirocin ointment      . pantoprazole  40 mg Oral QHS  . rOPINIRole  0.5 mg Oral QHS  . senna-docusate  2 tablet Oral QHS  . sodium chloride flush  3 mL Intravenous Q12H  . vancomycin  1,500 mg Intravenous Q12H  . vitamin C  500 mg Oral BID   Continuous Infusions:     Time spent: 25 minutes    [employer]  [facility]  01/14/2016, 11:41 AM  LOS: 2 days

## 2016-01-15 ENCOUNTER — Inpatient Hospital Stay (HOSPITAL_COMMUNITY): Payer: Medicare Other

## 2016-01-15 DIAGNOSIS — R55 Syncope and collapse: Secondary | ICD-10-CM

## 2016-01-15 LAB — CBC
HEMATOCRIT: 35.7 % — AB (ref 39.0–52.0)
Hemoglobin: 11.6 g/dL — ABNORMAL LOW (ref 13.0–17.0)
MCH: 29.2 pg (ref 26.0–34.0)
MCHC: 32.5 g/dL (ref 30.0–36.0)
MCV: 89.9 fL (ref 78.0–100.0)
PLATELETS: 242 10*3/uL (ref 150–400)
RBC: 3.97 MIL/uL — ABNORMAL LOW (ref 4.22–5.81)
RDW: 13.1 % (ref 11.5–15.5)
WBC: 7.4 10*3/uL (ref 4.0–10.5)

## 2016-01-15 LAB — BASIC METABOLIC PANEL
Anion gap: 13 (ref 5–15)
BUN: 15 mg/dL (ref 6–20)
CALCIUM: 8.2 mg/dL — AB (ref 8.9–10.3)
CO2: 23 mmol/L (ref 22–32)
CREATININE: 0.53 mg/dL — AB (ref 0.61–1.24)
Chloride: 99 mmol/L — ABNORMAL LOW (ref 101–111)
GFR calc non Af Amer: >60 mL/min (ref >60)
Glucose, Bld: 159 mg/dL — ABNORMAL HIGH (ref 65–99)
Potassium: 4.1 mmol/L (ref 3.5–5.1)
SODIUM: 135 mmol/L (ref 135–145)

## 2016-01-15 LAB — GLUCOSE, CAPILLARY
GLUCOSE-CAPILLARY: 145 mg/dL — AB (ref 65–99)
GLUCOSE-CAPILLARY: 129 mg/dL — AB (ref 65–99)
GLUCOSE-CAPILLARY: 170 mg/dL — AB (ref 65–99)
Glucose-Capillary: 157 mg/dL — ABNORMAL HIGH (ref 65–99)

## 2016-01-15 MED ORDER — MUPIROCIN 2 % EX OINT
TOPICAL_OINTMENT | CUTANEOUS | Status: AC
  Administered 2016-01-15: 09:00:00
  Filled 2016-01-15: qty 22

## 2016-01-15 MED ORDER — DEXAMETHASONE SODIUM PHOSPHATE 10 MG/ML IJ SOLN
INTRAMUSCULAR | Status: AC
  Filled 2016-01-15: qty 1

## 2016-01-15 NOTE — Progress Notes (Signed)
*  PRELIMINARY RESULTS* Vascular Ultrasound Carotid Duplex (Doppler) has been completed.  Findings suggest 1-39% internal carotid artery stenosis bilaterally. Vertebral arteries are patent with antegrade flow.  01/15/2016 11:25 AM Maudry Mayhew, RVT, RDCS, RDMS

## 2016-01-15 NOTE — Progress Notes (Signed)
PROGRESS NOTE    [name]  [id number] DOB: 01/30/1946 DOA: 01/11/2016 PCP: Irven Shelling, MD     Brief Narrative:  70 year old morbidly obese male with history of stroke, mostly nonambulatory, hypertension, hyperlipidemia, diabetes mellitus, depression, chronic indwelling Foley, CAD, diastolic CHF, DVT not on anticoagulation, OSA (not on CPAP), chronic back pain presented with scrotal pain and altered mental status. Patient does not remember the incident much says he was having pain in his testes for the last few days. As per admission H&P he was normal on the morning of admission but was later found to be confused and minimally responsive. In the ED he was found to be septic with a low-grade fever, WBC of 19.6 K, tachycardic and tachypnea. UA was positive for UTI and on exam had erythema with swelling of the scrotum. Ultrasound of the scrotum showed left epididymoorchitis, microlithiasis involving the stented vessels and 3 adjacent complex cysts/spermatoceles involving left epididymal head. Patient admitted for sepsis due to UTI and epididymoorchitis. Sepsis pathway initiated in the ED.   Assessment/Plan: Sepsis secondary to catheter associated UTI and epididymoorchitis Sepsis resolved.. Empiric vancomycin and aztreonam on admission.  Transitioned to IV ertapenem when urine culture grew ESBL E coli, and Klebsiella.  Blood cultures NGTD. - Reportedly Foley catheter change in the ED. (As per patient catheter should have been changed every month but was not changed in the past 3 months at [facility]). --Will request PICC in anticipation of completing at least seven additional days of IV ertapenem at SNF --Anticipate discharge in the AM  Acute encephalopathy Secondary to sepsis. Now resolved.   History of stroke Patient reports being nonambulatory due to "passing out when I stand up." Previously ambulated with a walker but reports that he is being moved off the rehab floor at his  SNF facility because he has been unable to participate. PT following. Mild L ICA disease documented in 2016. Orthostatic vital signs were not documented, but preliminary report for carotid dopplers does not suggest significant stenosis. Patient does not seem overly concerned that he has not been walking. Continue aspirin and beta blocker.  Additional imagine deferred for now.  Chronic diastolic CHF Euvolemic. Continue aspirin, beta blocker and Lasix.  Chronic hip/back pain Reports shooting pain down to the legs and is chronic. Improved with Neurontin and Ultracet.  Uncontrolled type 2 diabetes mellitus Last A1c of 10.4. NPH to 26 units twice a day; sliding scale coverage.  GERD Continue PPI  Pressure ulcer 2x2 cm across bilateral buttocks, noninfected. Also has right heel ulcer 0.5x0.5cm. Will need to continue wound care recommendations at SNF.  Coronary artery disease Continue aspirin and beta blocker  Depression Continue home medications    Code Status: DO NOT RESUSCITATE Family Communication: None at bedside (patient reports that he has a brother who is [place]of[place]state) Disposition Plan: Return to skilled nursing facility possibly on 5/8   Consultants:  None  Procedures:  Ultrasound scrotum  Antibiotics:  IV vancomycin and aztreonam since 5/4-5/6  IV ertapenem since 5/6   Subjective: Looks better, feels better.  Scrotal edema improving.  Objective: Filed Vitals:   01/14/16 1920 01/14/16 2338 01/15/16 0707 01/15/16 1341  BP: 133/66 127/82 138/77 133/59  Pulse: 103 86 74 73  Temp: 98.7 F (37.1 C) 98.9 F (37.2 C) 97.8 F (36.6 C) 97.8 F (36.6 C)  TempSrc: Oral  Oral Oral  Resp:  17 18 16   Height:      Weight:      SpO2: 95% 97%  98% 99%    Intake/Output Summary (Last 24 hours) at 01/15/16 2124 Last data filed at 01/15/16 1859  Gross per 24 hour  Intake    648 ml  Output   3500 ml  Net  -2852 ml   Filed Weights   01/11/16 2104  Weight:  155 kg (341 lb 11.4 oz)    Examination:  General exam: Appears calm and comfortable  Respiratory system: Clear to auscultation. Respiratory effort normal. Cardiovascular system: NR/RR, no significant pitting edema Gastrointestinal system: Obese abdomen is compressible.  No guarding. Central nervous system: Alert and oriented. No focal neurological deficits. Skin: Dry GU: Scrotal edema and erythema improved Psychiatry: Judgement and insight appear normal. Mood & affect appropriate.   Data Reviewed: I have personally reviewed following labs and imaging studies  CBC:  Recent Labs Lab 01/11/16 1442 01/12/16 0530 01/13/16 0537 01/15/16 0513  WBC 19.6* 18.5* 13.3* 7.4  NEUTROABS 16.8*  --   --   --   HGB 13.6 12.8* 11.8* 11.6*  HCT 40.1 37.8* 36.5* 35.7*  MCV 87.6 90.6 90.8 89.9  PLT 232 186 203 [id number]   Basic Metabolic Panel:  Recent Labs Lab 01/11/16 1442 01/12/16 0530 01/13/16 0537 01/15/16 0513  NA 132* 133* 136 135  K 4.7 5.3* 4.0 4.1  CL 99* 101 100* 99*  CO2 23 23 23 23   GLUCOSE 255* 238* 228* 159*  BUN 15 15 15 15   CREATININE 0.67 0.67 0.76 0.53*  CALCIUM 8.7* 8.2* 8.2* 8.2*   GFR: Estimated Creatinine Clearance: 140.9 mL/min (by C-G formula based on Cr of 0.53). Liver Function Tests:  Recent Labs Lab 01/11/16 1442  AST 17  ALT 13*  ALKPHOS 102  BILITOT 0.6  PROT 6.4*  ALBUMIN 2.9*   CBG:  Recent Labs Lab 01/14/16 1713 01/14/16 2337 01/15/16 0751 01/15/16 1200 01/15/16 1650  GLUCAP 310* 206* 145* 157* 129*   Urine analysis:    Component Value Date/Time   COLORURINE YELLOW 01/11/2016 1455   APPEARANCEUR CLOUDY* 01/11/2016 1455   LABSPEC 1.015 01/11/2016 1455   PHURINE 6.0 01/11/2016 1455   GLUCOSEU NEGATIVE 01/11/2016 1455   HGBUR MODERATE* 01/11/2016 1455   BILIRUBINUR NEGATIVE 01/11/2016 1455   KETONESUR NEGATIVE 01/11/2016 1455   PROTEINUR 100* 01/11/2016 1455   UROBILINOGEN 1.0 07/01/2015 1455   NITRITE NEGATIVE 01/11/2016 1455     LEUKOCYTESUR MODERATE* 01/11/2016 1455    Recent Results (from the past 240 hour(s))  Blood Culture (routine x 2)     Status: None (Preliminary result)   Collection Time: 01/11/16  2:42 PM  Result Value Ref Range Status   Specimen Description BLOOD RIGHT WRIST  Final   Special Requests BOTTLES DRAWN AEROBIC AND ANAEROBIC 5CC  Final   Culture NO GROWTH 4 DAYS  Final   Report Status PENDING  Incomplete  Blood Culture (routine x 2)     Status: None (Preliminary result)   Collection Time: 01/11/16  2:54 PM  Result Value Ref Range Status   Specimen Description BLOOD LEFT HAND  Final   Special Requests BOTTLES DRAWN AEROBIC ONLY 5CC  Final   Culture NO GROWTH 4 DAYS  Final   Report Status PENDING  Incomplete  Urine culture     Status: Abnormal   Collection Time: 01/11/16  2:55 PM  Result Value Ref Range Status   Specimen Description URINE, CLEAN CATCH  Final   Special Requests NONE  Final   Culture (A)  Final    >=100,000 COLONIES/mL ESCHERICHIA COLI Confirmed  Extended Spectrum Beta-Lactamase Producer (ESBL) >=100,000 COLONIES/mL KLEBSIELLA PNEUMONIAE    Report Status 01/14/2016 FINAL  Final   Organism ID, Bacteria ESCHERICHIA COLI (A)  Final   Organism ID, Bacteria KLEBSIELLA PNEUMONIAE (A)  Final      Susceptibility   Escherichia coli - MIC*    AMPICILLIN >=32 RESISTANT Resistant     CEFAZOLIN >=64 RESISTANT Resistant     CEFTRIAXONE >=64 RESISTANT Resistant     CIPROFLOXACIN >=4 RESISTANT Resistant     GENTAMICIN <=1 SENSITIVE Sensitive     IMIPENEM <=0.25 SENSITIVE Sensitive     NITROFURANTOIN <=16 SENSITIVE Sensitive     TRIMETH/SULFA <=20 SENSITIVE Sensitive     AMPICILLIN/SULBACTAM >=32 RESISTANT Resistant     PIP/TAZO 64 INTERMEDIATE Intermediate     * >=100,000 COLONIES/mL ESCHERICHIA COLI   Klebsiella pneumoniae - MIC*    AMPICILLIN 16 RESISTANT Resistant     CEFAZOLIN <=4 SENSITIVE Sensitive     CEFTRIAXONE <=1 SENSITIVE Sensitive     CIPROFLOXACIN <=0.25  SENSITIVE Sensitive     GENTAMICIN <=1 SENSITIVE Sensitive     IMIPENEM <=0.25 SENSITIVE Sensitive     NITROFURANTOIN 32 SENSITIVE Sensitive     TRIMETH/SULFA <=20 SENSITIVE Sensitive     AMPICILLIN/SULBACTAM 4 SENSITIVE Sensitive     PIP/TAZO <=4 SENSITIVE Sensitive     * >=100,000 COLONIES/mL KLEBSIELLA PNEUMONIAE  MRSA PCR Screening     Status: None   Collection Time: 01/11/16 10:32 PM  Result Value Ref Range Status   MRSA by PCR NEGATIVE NEGATIVE Final    Comment:        The GeneXpert MRSA Assay (FDA approved for NASAL specimens only), is one component of a comprehensive MRSA colonization surveillance program. It is not intended to diagnose MRSA infection nor to guide or monitor treatment for MRSA infections.          Radiology Studies: No results found.      Scheduled Meds: . aspirin EC  81 mg Oral q morning - 10a  . dexamethasone      . enoxaparin (LOVENOX) injection  80 mg Subcutaneous Q24H  . ertapenem  1 g Intravenous Q24H  . escitalopram  10 mg Oral q morning - 10a  . feeding supplement (PRO-STAT SUGAR FREE 64)  30 mL Oral TID WC  . furosemide  40 mg Oral BID  . gabapentin  100 mg Oral BID  . hydrOXYzine  25 mg Oral BID  . insulin aspart  0-9 Units Subcutaneous TID WC  . insulin NPH Human  26 Units Subcutaneous BID AC & HS  . metoprolol  12.5 mg Oral BID  . mupirocin cream   Topical BID  . pantoprazole  40 mg Oral QHS  . rOPINIRole  0.5 mg Oral QHS  . senna-docusate  2 tablet Oral QHS  . sodium chloride flush  3 mL Intravenous Q12H  . vitamin C  500 mg Oral BID   Continuous Infusions:    LOS: 3 days    Time spent: 25 minutes    Eber Jones, MD [facility]  01/15/2016, 9:24 PM

## 2016-01-16 DIAGNOSIS — A419 Sepsis, unspecified organism: Secondary | ICD-10-CM | POA: Diagnosis not present

## 2016-01-16 DIAGNOSIS — A498 Other bacterial infections of unspecified site: Secondary | ICD-10-CM | POA: Diagnosis not present

## 2016-01-16 DIAGNOSIS — N3090 Cystitis, unspecified without hematuria: Secondary | ICD-10-CM | POA: Diagnosis not present

## 2016-01-16 DIAGNOSIS — M25512 Pain in left shoulder: Secondary | ICD-10-CM | POA: Diagnosis not present

## 2016-01-16 DIAGNOSIS — T83511A Infection and inflammatory reaction due to indwelling urethral catheter, initial encounter: Secondary | ICD-10-CM | POA: Diagnosis not present

## 2016-01-16 DIAGNOSIS — N319 Neuromuscular dysfunction of bladder, unspecified: Secondary | ICD-10-CM | POA: Diagnosis not present

## 2016-01-16 DIAGNOSIS — I5032 Chronic diastolic (congestive) heart failure: Secondary | ICD-10-CM | POA: Diagnosis not present

## 2016-01-16 DIAGNOSIS — I82891 Chronic embolism and thrombosis of other specified veins: Secondary | ICD-10-CM | POA: Diagnosis not present

## 2016-01-16 DIAGNOSIS — R6520 Severe sepsis without septic shock: Secondary | ICD-10-CM | POA: Diagnosis not present

## 2016-01-16 DIAGNOSIS — L8990 Pressure ulcer of unspecified site, unspecified stage: Secondary | ICD-10-CM | POA: Diagnosis not present

## 2016-01-16 DIAGNOSIS — F329 Major depressive disorder, single episode, unspecified: Secondary | ICD-10-CM | POA: Diagnosis not present

## 2016-01-16 DIAGNOSIS — G2581 Restless legs syndrome: Secondary | ICD-10-CM | POA: Diagnosis not present

## 2016-01-16 DIAGNOSIS — I110 Hypertensive heart disease with heart failure: Secondary | ICD-10-CM | POA: Diagnosis not present

## 2016-01-16 DIAGNOSIS — Z79899 Other long term (current) drug therapy: Secondary | ICD-10-CM | POA: Diagnosis not present

## 2016-01-16 DIAGNOSIS — E1165 Type 2 diabetes mellitus with hyperglycemia: Secondary | ICD-10-CM | POA: Diagnosis not present

## 2016-01-16 DIAGNOSIS — N390 Urinary tract infection, site not specified: Secondary | ICD-10-CM | POA: Diagnosis not present

## 2016-01-16 DIAGNOSIS — G9340 Encephalopathy, unspecified: Secondary | ICD-10-CM

## 2016-01-16 DIAGNOSIS — L89309 Pressure ulcer of unspecified buttock, unspecified stage: Secondary | ICD-10-CM | POA: Diagnosis not present

## 2016-01-16 DIAGNOSIS — R4182 Altered mental status, unspecified: Secondary | ICD-10-CM

## 2016-01-16 DIAGNOSIS — T83518A Infection and inflammatory reaction due to other urinary catheter, initial encounter: Secondary | ICD-10-CM | POA: Diagnosis not present

## 2016-01-16 DIAGNOSIS — K219 Gastro-esophageal reflux disease without esophagitis: Secondary | ICD-10-CM | POA: Diagnosis not present

## 2016-01-16 DIAGNOSIS — M6281 Muscle weakness (generalized): Secondary | ICD-10-CM | POA: Diagnosis not present

## 2016-01-16 DIAGNOSIS — E1141 Type 2 diabetes mellitus with diabetic mononeuropathy: Secondary | ICD-10-CM | POA: Diagnosis not present

## 2016-01-16 DIAGNOSIS — I5033 Acute on chronic diastolic (congestive) heart failure: Secondary | ICD-10-CM | POA: Diagnosis not present

## 2016-01-16 DIAGNOSIS — E1169 Type 2 diabetes mellitus with other specified complication: Secondary | ICD-10-CM | POA: Diagnosis not present

## 2016-01-16 DIAGNOSIS — R55 Syncope and collapse: Secondary | ICD-10-CM | POA: Diagnosis not present

## 2016-01-16 DIAGNOSIS — E1151 Type 2 diabetes mellitus with diabetic peripheral angiopathy without gangrene: Secondary | ICD-10-CM | POA: Diagnosis not present

## 2016-01-16 DIAGNOSIS — E785 Hyperlipidemia, unspecified: Secondary | ICD-10-CM | POA: Diagnosis not present

## 2016-01-16 DIAGNOSIS — I2510 Atherosclerotic heart disease of native coronary artery without angina pectoris: Secondary | ICD-10-CM | POA: Diagnosis not present

## 2016-01-16 DIAGNOSIS — N453 Epididymo-orchitis: Secondary | ICD-10-CM | POA: Diagnosis not present

## 2016-01-16 DIAGNOSIS — Z8673 Personal history of transient ischemic attack (TIA), and cerebral infarction without residual deficits: Secondary | ICD-10-CM | POA: Diagnosis not present

## 2016-01-16 DIAGNOSIS — E118 Type 2 diabetes mellitus with unspecified complications: Secondary | ICD-10-CM | POA: Diagnosis not present

## 2016-01-16 LAB — CULTURE, BLOOD (ROUTINE X 2)
CULTURE: NO GROWTH
Culture: NO GROWTH

## 2016-01-16 LAB — GLUCOSE, CAPILLARY: GLUCOSE-CAPILLARY: 95 mg/dL (ref 65–99)

## 2016-01-16 MED ORDER — HEPARIN SOD (PORK) LOCK FLUSH 100 UNIT/ML IV SOLN
250.0000 [IU] | INTRAVENOUS | Status: AC | PRN
  Administered 2016-01-16: 250 [IU]

## 2016-01-16 MED ORDER — TRAMADOL-ACETAMINOPHEN 37.5-325 MG PO TABS: 2.0000 | ORAL_TABLET | Freq: Four times a day (QID) | ORAL | Status: DC | PRN

## 2016-01-16 MED ORDER — SODIUM CHLORIDE 0.9% FLUSH: 10.0000 mL | INTRAVENOUS | Status: DC | PRN

## 2016-01-16 MED ORDER — SODIUM CHLORIDE 0.9 % IV SOLN: 1.0000 g | INTRAVENOUS | Status: DC

## 2016-01-16 MED ORDER — GABAPENTIN 100 MG PO CAPS: 100.0000 mg | ORAL_CAPSULE | Freq: Two times a day (BID) | ORAL | Status: DC

## 2016-01-16 NOTE — Care Management Note (Signed)
Case Management Note  Patient Details  Name: [name] MRN: [id number] Date of Birth: Oct 22, 1945  Subjective/Objective:               Admitted with UTI.     Action/Plan: Plan is to d/c to SNF([facility]) today. No further  needs identified per CM.  Expected Discharge Date:   01/16/2016          Expected Discharge Plan:  [place] (From [place])  In-House Referral:  Clinical Social Work  Discharge planning Services  CM Consult  Post Acute Care Choice:  NA Choice offered to:     DME Arranged:    DME Agency:     HH Arranged:    [place] Agency:     Status of Service:  Completed, signed off  Medicare Important Message Given:    Date Medicare IM Given:    Medicare IM give by:    Date Additional Medicare IM Given:    Additional Medicare Important Message give by:     If discussed at [place] of Stay Meetings, dates discussed:    Additional Comments:  Sharin Mons, [place] [phone] 01/16/2016, 11:09 AM

## 2016-01-16 NOTE — Progress Notes (Addendum)
Nsg Discharge Note  Admit Date:  01/11/2016 Discharge date: 01/16/2016   [name] to be D/C'd Skilled nursing facility per MD order.  AVS completed.  Copy for chart, and copy for patient signed, and dated. Patient/caregiver able to verbalize understanding.  Discharge Medication:   Medication List    STOP taking these medications        diphenoxylate-atropine 2.5-0.025 MG tablet  Commonly known as:  LOMOTIL     potassium chloride SA 20 MEQ tablet  Commonly known as:  K-DUR,KLOR-CON      TAKE these medications        alum & mag hydroxide-simeth 200-200-20 MG/5ML suspension  Commonly known as:  MAALOX/MYLANTA  Take 30 mLs by mouth every 6 (six) hours as needed for indigestion or heartburn (dyspepsia).     aspirin EC 81 MG tablet  Take 81 mg by mouth every morning.     ertapenem 1 g in sodium chloride 0.9 % 50 mL  Inject 1 g into the vein daily.     escitalopram 10 MG tablet  Commonly known as:  LEXAPRO  Take 10 mg by mouth every morning.     fexofenadine 180 MG tablet  Commonly known as:  ALLEGRA  Take 180 mg by mouth daily as needed for allergies or rhinitis.     furosemide 40 MG tablet  Commonly known as:  LASIX  Take 1 tablet (40 mg total) by mouth 2 (two) times daily.     gabapentin 100 MG capsule  Commonly known as:  NEURONTIN  Take 1 capsule (100 mg total) by mouth 2 (two) times daily.     hydrOXYzine 25 MG tablet  Commonly known as:  ATARAX/VISTARIL  Take 25 mg by mouth 2 (two) times daily.     insulin NPH Human 100 UNIT/ML injection  Commonly known as:  HUMULIN N  Inject 0.28 mLs (28 Units total) into the skin 2 (two) times daily at 8 am and 10 pm.     magnesium oxide 400 (241.3 Mg) MG tablet  Commonly known as:  MAG-OX  Take 1 tablet (400 mg total) by mouth daily.     metoprolol tartrate 25 MG tablet  Commonly known as:  LOPRESSOR  Take 0.5 tablets (12.5 mg total) by mouth 2 (two) times daily.     mupirocin cream 2 %  Commonly known as:   BACTROBAN  Apply topically 2 (two) times daily.     nitroGLYCERIN 0.4 MG SL tablet  Commonly known as:  NITROSTAT  Place 1 tablet (0.4 mg total) under the tongue every 5 (five) minutes as needed for chest pain.     omeprazole 40 MG capsule  Commonly known as:  PRILOSEC  Take 1 capsule (40 mg total) by mouth daily.     potassium chloride 20 MEQ packet  Commonly known as:  KLOR-CON  Take 40 mEq by mouth every morning.     PRO-STAT Liqd  Take 1 Can by mouth 3 (three) times daily.     rOPINIRole 0.5 MG tablet  Commonly known as:  REQUIP  Take 0.5 mg by mouth at bedtime.     senna-docusate 8.6-50 MG tablet  Commonly known as:  Senokot-S  Take 2 tablets by mouth at bedtime.     traMADol-acetaminophen 37.5-325 MG tablet  Commonly known as:  ULTRACET  Take 2 tablets by mouth every 6 (six) hours as needed for moderate pain.     vitamin C 500 MG tablet  Commonly known as:  ASCORBIC ACID  Take 500 mg by mouth 2 (two) times daily.        Discharge Assessment: Filed Vitals:   01/15/16 2311 01/16/16 0527  BP: 141/58 163/83  Pulse: 71 78  Temp: 97.9 F (36.6 C) 97.7 F (36.5 C)  Resp: 17 18   Skin clean, dry and intact without evidence of skin break down, no evidence of skin tears noted. IV catheter discontinued intact. Site without signs and symptoms of complications - no redness or edema noted at insertion site, patient denies c/o pain - only slight tenderness at site.  Dressing with slight pressure applied.  D/c Instructions-Education: Discharge instructions given to patient/family with verbalized understanding. D/c education completed with patient/family including follow up instructions, medication list, d/c activities limitations if indicated, with other d/c instructions as indicated by MD - patient able to verbalize understanding, all questions fully answered. Patient instructed to return to ED, call 911, or call MD for any changes in condition.  Patient transported via  [facility] to [facility]. Attempted to give report twice before [facility] arrived for patient but continued to be put on hold and no one ever came to the phone.    Salley Slaughter, RN 01/16/2016 12:59 PM

## 2016-01-16 NOTE — Consult Note (Signed)
   [facility] CM Inpatient Consult   01/16/2016  [name] 1946/07/21 [id number] Patient screened for potential [facility] Management services. Patient is eligible for [facility] with patient's Medicare plan.  Spoke with inpatient RNCM and  Electronic medical record review reveals patient's discharge plan is to return to skilled nursing facility. [facility] Care Management services not appropriate at this time. If patient's post hospital needs change please place a [facility] Care Management consult. For questions please contact:   [name], RN BSN [facility] Liaison  [phone] business mobile phone Toll free office [phone]

## 2016-01-16 NOTE — Discharge Summary (Addendum)
Physician Discharge Summary  [name] MRN: [id number] DOB/AGE: 10-19-1945 70 y.o.  PCP: Irven Shelling, MD   Admit date: 01/11/2016 Discharge date: 01/16/2016  Discharge Diagnoses:   Principal Problem:   UTI (urinary tract infection) due to urinary indwelling catheter ([place]) Active Problems:   Diabetic neuropathy ([place])   CVA (cerebral infarction)   Chronic indwelling Foley catheter   Chronic diastolic CHF (congestive heart failure) ([place])   History of DVT (deep vein thrombosis)   Pressure ulcer   Hyperlipidemia LDL goal <70   Type II diabetes mellitus with peripheral circulatory disorder, uncontrolled (HCC)   CAD in native artery   GERD (gastroesophageal reflux disease)   Acute encephalopathy   Sepsis ([place])   Depression   UTI (lower urinary tract infection)   Epididymo-orchitis   Altered mental status    Follow-up recommendations Follow-up with PCP in 3-5 days , including all  additional recommended appointments as below Follow-up CBC, CMP in 3-5 days Continue ertapenem for 7 more days from day of discharge   woc recommendations  Right heel with dark purple area of deep tissue injury; .5X.5cm -Dressing procedure/placement/frequency: Barrier cream to repel moisture and promote healing. Float heels to reduce pressure     Current Discharge Medication List    START taking these medications   Details  ertapenem 1 g in sodium chloride 0.9 % 50 mL Inject 1 g into the vein daily. Qty: 7 ampule, Refills: 0    gabapentin (NEURONTIN) 100 MG capsule Take 1 capsule (100 mg total) by mouth 2 (two) times daily. Qty: 60 capsule, Refills: 0    traMADol-acetaminophen (ULTRACET) 37.5-325 MG tablet Take 2 tablets by mouth every 6 (six) hours as needed for moderate pain. Qty: 30 tablet, Refills: 0      CONTINUE these medications which have NOT CHANGED   Details  alum & mag hydroxide-simeth (MAALOX/MYLANTA) 200-200-20 MG/5ML suspension Take 30 mLs by mouth every 6 (six)  hours as needed for indigestion or heartburn (dyspepsia). Qty: 355 mL, Refills: 0    Amino Acids-Protein Hydrolys (PRO-STAT) LIQD Take 1 Can by mouth 3 (three) times daily.    aspirin EC 81 MG tablet Take 81 mg by mouth every morning.     escitalopram (LEXAPRO) 10 MG tablet Take 10 mg by mouth every morning.    fexofenadine (ALLEGRA) 180 MG tablet Take 180 mg by mouth daily as needed for allergies or rhinitis.    furosemide (LASIX) 40 MG tablet Take 1 tablet (40 mg total) by mouth 2 (two) times daily. Qty: 60 tablet, Refills: 0    hydrOXYzine (ATARAX/VISTARIL) 25 MG tablet Take 25 mg by mouth 2 (two) times daily.    insulin NPH Human (HUMULIN N) 100 UNIT/ML injection Inject 0.28 mLs (28 Units total) into the skin 2 (two) times daily at 8 am and 10 pm. Qty: 10 mL, Refills: 11    magnesium oxide (MAG-OX) 400 (241.3 Mg) MG tablet Take 1 tablet (400 mg total) by mouth daily. Qty: 10 tablet, Refills: 0    metoprolol (LOPRESSOR) 25 MG tablet Take 0.5 tablets (12.5 mg total) by mouth 2 (two) times daily. Qty: 30 tablet, Refills: 6    mupirocin cream (BACTROBAN) 2 % Apply topically 2 (two) times daily. Qty: 15 g, Refills: 0    nitroGLYCERIN (NITROSTAT) 0.4 MG SL tablet Place 1 tablet (0.4 mg total) under the tongue every 5 (five) minutes as needed for chest pain. Qty: 25 tablet, Refills: 2    omeprazole (PRILOSEC) 40 MG capsule  Take 1 capsule (40 mg total) by mouth daily. Qty: 30 capsule, Refills: 0    potassium chloride (KLOR-CON) 20 MEQ packet Take 40 mEq by mouth every morning.    rOPINIRole (REQUIP) 0.5 MG tablet Take 0.5 mg by mouth at bedtime.    senna-docusate (SENOKOT-S) 8.6-50 MG tablet Take 2 tablets by mouth at bedtime.    vitamin C (ASCORBIC ACID) 500 MG tablet Take 500 mg by mouth 2 (two) times daily.      STOP taking these medications     diphenoxylate-atropine (LOMOTIL) 2.5-0.025 MG tablet      potassium chloride SA (K-DUR,KLOR-CON) 20 MEQ tablet           Discharge Condition: stable     Discharge Instructions Get Medicines reviewed and adjusted: Please take all your medications with you for your next visit with your Primary MD  Please request your Primary MD to go over all hospital tests and procedure/radiological results at the follow up, please ask your Primary MD to get all Hospital records sent to his/her office.  If you experience worsening of your admission symptoms, develop shortness of breath, life threatening emergency, suicidal or homicidal thoughts you must seek medical attention immediately by calling 911 or calling your MD immediately if symptoms less severe.  You must read complete instructions/literature along with all the possible adverse reactions/side effects for all the Medicines you take and that have been prescribed to you. Take any new Medicines after you have completely understood and accpet all the possible adverse reactions/side effects.   Do not drive when taking Pain medications.   Do not take more than prescribed Pain, Sleep and Anxiety Medications  Special Instructions: If you have smoked or chewed Tobacco in the last 2 yrs please stop smoking, stop any regular Alcohol and or any Recreational drug use.  Wear Seat belts while driving.  Please note  You were cared for by a hospitalist during your hospital stay. Once you are discharged, your primary care physician will handle any further medical issues. Please note that NO REFILLS for any discharge medications will be authorized once you are discharged, as it is imperative that you return to your primary care physician (or establish a relationship with a primary care physician if you do not have one) for your aftercare needs so that they can reassess your need for medications and monitor your lab values.  Discharge Instructions    Diet - low sodium heart healthy    Complete by:  As directed      Increase activity slowly    Complete by:  As directed              Allergies  Allergen Reactions  . Ace Inhibitors Swelling    Pt tolerates lisinopril  . Lipitor [Atorvastatin Calcium] Swelling  . Metformin And Related Swelling  . Cefadroxil Hives  . Cephalexin Hives  . Morphine And Related Other (See Comments)    Sweating, feels like is "in rocky boat."  . Robaxin [Methocarbamol] Other (See Comments)    Feels like he is shaky      Disposition: 03-Skilled Nursing Facility   Consults: none   Significant Diagnostic Studies:  Ct Head Wo Contrast  01/11/2016  CLINICAL DATA:  Altered mental status, dysphagia EXAM: CT HEAD WITHOUT CONTRAST TECHNIQUE: Contiguous axial images were obtained from the base of the skull through the vertex without intravenous contrast. COMPARISON:  09/22/2015 FINDINGS: Brain: No intracranial hemorrhage, mass effect or midline shift. Mild cerebral atrophy again noted.  Bilateral basal ganglia punctate calcifications are again noted. Stable mild periventricular chronic white matter disease. No acute cortical infarction. No mass lesion is noted on this unenhanced scan. Vascular: Mild atherosclerotic calcifications of carotid siphon again noted. Skull: No skull fracture is noted. Sinuses/Orbits: Paranasal sinuses and mastoid air cells are unremarkable. Other: None IMPRESSION: No acute intracranial abnormality. Stable mild cerebral atrophy and chronic white matter disease. No definite acute cortical infarction. Electronically Signed   By: Lahoma Crocker M.D.   On: 01/11/2016 16:53   US Scrotum  01/11/2016  CLINICAL DATA:  70 year old presenting with bilateral testicular pain and tenderness. EXAM: SCROTAL ULTRASOUND DOPPLER ULTRASOUND OF THE TESTICLES TECHNIQUE: Complete ultrasound examination of the testicles, epididymis, and other scrotal structures was performed. Color and spectral Doppler ultrasound were also utilized to evaluate blood flow to the testicles. COMPARISON:  None. FINDINGS: Right testicle Measurements:  Approximately 4.7 x 2.3 x 3.6 cm. Normal parenchymal echotexture without mass or microlithiasis. Normal color Doppler flow without evidence of hyperemia. Left testicle Measurements: Approximately 5.1 x 2.7 x 3.2 cm. Normal parenchymal echotexture without evidence of mass. Microcalcifications throughout the left testicle. Hyperemia on color Doppler evaluation. Right epididymis: Normal in size and appearance without evidence of hyperemia. Left epididymis: 3 adjacent complex cysts/spermatoceles measuring approximately 5 mm, 10 mm and 8 mm respectively involving the epididymal head. Hyperemia on color Doppler evaluation involving the entire epididymis. Hydrocele:  Absent bilaterally. Varicocele:  Absent bilaterally. Pulsed Doppler interrogation of both testes demonstrates normal low resistance arterial and venous waveforms bilaterally. IMPRESSION: 1. Left epididymo-orchitis. 2. Microlithiasis involving the left testicle. 3. Three adjacent complex cysts/spermatoceles involving the left epididymal head. Electronically Signed   By: Evangeline Dakin M.D.   On: 01/11/2016 18:38   [place] Art/ven Flow Abd Pelv Doppler  01/11/2016  CLINICAL DATA:  70 year old presenting with bilateral testicular pain and tenderness. EXAM: SCROTAL ULTRASOUND DOPPLER ULTRASOUND OF THE TESTICLES TECHNIQUE: Complete ultrasound examination of the testicles, epididymis, and other scrotal structures was performed. Color and spectral Doppler ultrasound were also utilized to evaluate blood flow to the testicles. COMPARISON:  None. FINDINGS: Right testicle Measurements: Approximately 4.7 x 2.3 x 3.6 cm. Normal parenchymal echotexture without mass or microlithiasis. Normal color Doppler flow without evidence of hyperemia. Left testicle Measurements: Approximately 5.1 x 2.7 x 3.2 cm. Normal parenchymal echotexture without evidence of mass. Microcalcifications throughout the left testicle. Hyperemia on color Doppler evaluation. Right epididymis: Normal in  size and appearance without evidence of hyperemia. Left epididymis: 3 adjacent complex cysts/spermatoceles measuring approximately 5 mm, 10 mm and 8 mm respectively involving the epididymal head. Hyperemia on color Doppler evaluation involving the entire epididymis. Hydrocele:  Absent bilaterally. Varicocele:  Absent bilaterally. Pulsed Doppler interrogation of both testes demonstrates normal low resistance arterial and venous waveforms bilaterally. IMPRESSION: 1. Left epididymo-orchitis. 2. Microlithiasis involving the left testicle. 3. Three adjacent complex cysts/spermatoceles involving the left epididymal head. Electronically Signed   By: Evangeline Dakin M.D.   On: 01/11/2016 18:38   Dg Chest Port 1 View  01/11/2016  CLINICAL DATA:  Sepsis.  Altered mental status. EXAM: PORTABLE CHEST 1 VIEW COMPARISON:  10/08/2015 and 10/12/2015 FINDINGS: The heart size and pulmonary vascularity are normal and the lungs are clear except for minimal atelectasis at the left base posterior medially. No bone abnormality. IMPRESSION: Minimal left base atelectasis.  Otherwise, normal exam. Electronically Signed   By: Lorriane Shire M.D.   On: 01/11/2016 14:54        Filed Weights   01/11/16 2104  Weight: 155 kg (341 lb 11.4 oz)     Microbiology: Recent Results (from the past 240 hour(s))  Blood Culture (routine x 2)     Status: None (Preliminary result)   Collection Time: 01/11/16  2:42 PM  Result Value Ref Range Status   Specimen Description BLOOD RIGHT WRIST  Final   Special Requests BOTTLES DRAWN AEROBIC AND ANAEROBIC 5CC  Final   Culture NO GROWTH 4 DAYS  Final   Report Status PENDING  Incomplete  Blood Culture (routine x 2)     Status: None (Preliminary result)   Collection Time: 01/11/16  2:54 PM  Result Value Ref Range Status   Specimen Description BLOOD LEFT HAND  Final   Special Requests BOTTLES DRAWN AEROBIC ONLY 5CC  Final   Culture NO GROWTH 4 DAYS  Final   Report Status PENDING  Incomplete   Urine culture     Status: Abnormal   Collection Time: 01/11/16  2:55 PM  Result Value Ref Range Status   Specimen Description URINE, CLEAN CATCH  Final   Special Requests NONE  Final   Culture (A)  Final    >=100,000 COLONIES/mL ESCHERICHIA COLI Confirmed Extended Spectrum Beta-Lactamase Producer (ESBL) >=100,000 COLONIES/mL KLEBSIELLA PNEUMONIAE    Report Status 01/14/2016 FINAL  Final   Organism ID, Bacteria ESCHERICHIA COLI (A)  Final   Organism ID, Bacteria KLEBSIELLA PNEUMONIAE (A)  Final      Susceptibility   Escherichia coli - MIC*    AMPICILLIN >=32 RESISTANT Resistant     CEFAZOLIN >=64 RESISTANT Resistant     CEFTRIAXONE >=64 RESISTANT Resistant     CIPROFLOXACIN >=4 RESISTANT Resistant     GENTAMICIN <=1 SENSITIVE Sensitive     IMIPENEM <=0.25 SENSITIVE Sensitive     NITROFURANTOIN <=16 SENSITIVE Sensitive     TRIMETH/SULFA <=20 SENSITIVE Sensitive     AMPICILLIN/SULBACTAM >=32 RESISTANT Resistant     PIP/TAZO 64 INTERMEDIATE Intermediate     * >=100,000 COLONIES/mL ESCHERICHIA COLI   Klebsiella pneumoniae - MIC*    AMPICILLIN 16 RESISTANT Resistant     CEFAZOLIN <=4 SENSITIVE Sensitive     CEFTRIAXONE <=1 SENSITIVE Sensitive     CIPROFLOXACIN <=0.25 SENSITIVE Sensitive     GENTAMICIN <=1 SENSITIVE Sensitive     IMIPENEM <=0.25 SENSITIVE Sensitive     NITROFURANTOIN 32 SENSITIVE Sensitive     TRIMETH/SULFA <=20 SENSITIVE Sensitive     AMPICILLIN/SULBACTAM 4 SENSITIVE Sensitive     PIP/TAZO <=4 SENSITIVE Sensitive     * >=100,000 COLONIES/mL KLEBSIELLA PNEUMONIAE  MRSA PCR Screening     Status: None   Collection Time: 01/11/16 10:32 PM  Result Value Ref Range Status   MRSA by PCR NEGATIVE NEGATIVE Final    Comment:        The GeneXpert MRSA Assay (FDA approved for NASAL specimens only), is one component of a comprehensive MRSA colonization surveillance program. It is not intended to diagnose MRSA infection nor to guide or monitor treatment for MRSA  infections.        Blood Culture    Component Value Date/Time   SDES URINE, CLEAN CATCH 01/11/2016 1455   SPECREQUEST NONE 01/11/2016 1455   CULT * 01/11/2016 1455    >=100,000 COLONIES/mL ESCHERICHIA COLI Confirmed Extended Spectrum Beta-Lactamase Producer (ESBL) >=100,000 COLONIES/mL KLEBSIELLA PNEUMONIAE    REPTSTATUS 01/14/2016 FINAL 01/11/2016 1455      Labs: Results for orders placed or performed during the hospital encounter of 01/11/16 (from the past 48 hour(s))  Glucose, capillary  Status: Abnormal   Collection Time: 01/14/16  5:13 PM  Result Value Ref Range   Glucose-Capillary 310 (H) 65 - 99 mg/dL  Glucose, capillary     Status: Abnormal   Collection Time: 01/14/16 11:37 PM  Result Value Ref Range   Glucose-Capillary 206 (H) 65 - 99 mg/dL  CBC     Status: Abnormal   Collection Time: 01/15/16  5:13 AM  Result Value Ref Range   WBC 7.4 4.0 - 10.5 K/uL   RBC 3.97 (L) 4.22 - 5.81 MIL/uL   Hemoglobin 11.6 (L) 13.0 - 17.0 g/dL   HCT 35.7 (L) 39.0 - 52.0 %   MCV 89.9 78.0 - 100.0 fL   MCH 29.2 26.0 - 34.0 pg   MCHC 32.5 30.0 - 36.0 g/dL   RDW 13.1 11.5 - 15.5 %   Platelets 242 150 - 400 K/uL  Basic metabolic panel     Status: Abnormal   Collection Time: 01/15/16  5:13 AM  Result Value Ref Range   Sodium 135 135 - 145 mmol/L   Potassium 4.1 3.5 - 5.1 mmol/L   Chloride 99 (L) 101 - 111 mmol/L   CO2 23 22 - 32 mmol/L   Glucose, Bld 159 (H) 65 - 99 mg/dL   BUN 15 6 - 20 mg/dL   Creatinine, Ser 0.53 (L) 0.61 - 1.24 mg/dL   Calcium 8.2 (L) 8.9 - 10.3 mg/dL   GFR calc non Af Amer >60 >60 mL/min   GFR calc Af Amer >60 >60 mL/min    Comment: (NOTE) The eGFR has been calculated using the CKD EPI equation. This calculation has not been validated in all clinical situations. eGFR's persistently <60 mL/min signify possible Chronic Kidney Disease.    Anion gap 13 5 - 15  Glucose, capillary     Status: Abnormal   Collection Time: 01/15/16  7:51 AM  Result Value  Ref Range   Glucose-Capillary 145 (H) 65 - 99 mg/dL  Glucose, capillary     Status: Abnormal   Collection Time: 01/15/16 12:00 PM  Result Value Ref Range   Glucose-Capillary 157 (H) 65 - 99 mg/dL  Glucose, capillary     Status: Abnormal   Collection Time: 01/15/16  4:50 PM  Result Value Ref Range   Glucose-Capillary 129 (H) 65 - 99 mg/dL  Glucose, capillary     Status: Abnormal   Collection Time: 01/15/16 11:14 PM  Result Value Ref Range   Glucose-Capillary 170 (H) 65 - 99 mg/dL  Glucose, capillary     Status: None   Collection Time: 01/16/16  8:00 AM  Result Value Ref Range   Glucose-Capillary 95 65 - 99 mg/dL   Comment 1 Notify RN      Lipid Panel     Component Value Date/Time   CHOL 163 04/30/2015 1047   TRIG 133 04/30/2015 1047   HDL 30* 04/30/2015 1047   CHOLHDL 5.4 04/30/2015 1047   VLDL 27 04/30/2015 1047   LDLCALC 106* 04/30/2015 1047     Lab Results  Component Value Date   HGBA1C 10.4* 09/10/2015   HGBA1C 9.3* 04/30/2015   HGBA1C 9.6* 04/14/2015     Lab Results  Component Value Date   LDLCALC 106* 04/30/2015   CREATININE 0.53* 01/15/2016     70 year old morbidly obese male with history of stroke, mostly nonambulatory, hypertension, hyperlipidemia, diabetes mellitus, depression, chronic indwelling Foley, CAD, diastolic CHF, DVT not on anticoagulation, OSA (not on CPAP), chronic back pain presented with scrotal pain and  altered mental status. Patient does not remember the incident much says he was having pain in his testes for the last few days. As per admission H&P he was normal on the morning of admission but was later found to be confused and minimally responsive. In the ED he was found to be septic with a low-grade fever, WBC of 19.6 K, tachycardic and tachypnea. UA was positive for UTI and on exam had erythema with swelling of the scrotum. Ultrasound of the scrotum showed left epididymoorchitis, microlithiasis involving the stented vessels and 3  adjacent complex cysts/spermatoceles involving left epididymal head. Patient admitted for sepsis due to UTI and epididymoorchitis. Sepsis pathway initiated in the ED.   Assessment/Plan: Sepsis secondary to catheter associated UTI and epididymoorchitis Sepsis resolved.. Empiric vancomycin and aztreonam on admission. Transitioned to IV ertapenem  For  ESBL E coli , and Klebsiella UTI. Blood cultures NGTD. - Reportedly Foley catheter change in the ED. (As per patient catheter should have been changed every month but was not changed in the past 3 months at [facility]).  PICC in anticipation of completing at least seven additional days of IV ertapenem at SNF --Anticipate discharge 5/8  Acute encephalopathy Secondary to sepsis. Now resolved.   History of stroke Patient reports being nonambulatory due to "passing out when I stand up." Previously ambulated with a walker but reports that he is being moved off the rehab floor at his SNF facility because he has been unable to participate. PT following. Mild L ICA disease documented in 2016. Orthostatic vital signs were not documented, but preliminary report for carotid dopplers does not suggest significant stenosis. Patient does not seem overly concerned that he has not been walking. Continue aspirin and beta blocker. Additional imagine deferred for now.  Chronic diastolic CHF Euvolemic. Continue aspirin, beta blocker and Lasix.  Chronic hip/back pain Reports shooting pain down to the legs and is chronic. Improved with Neurontin and Ultracet.  Uncontrolled type 2 diabetes mellitus Last A1c of 10.4. NPH to 26 units twice a day; sliding scale coverage.  GERD Continue PPI  Pressure ulcer 2x2 cm across bilateral buttocks, noninfected. Also has right heel ulcer 0.5x0.5cm. Will need to continue wound care recommendations at SNF.  Coronary artery disease Continue aspirin and beta blocker  Depression Continue home medications    Code  Status: DO NOT RESUSCITATE  Discharge Exam:   Blood pressure 163/83, pulse 78, temperature 97.7 F (36.5 C), temperature source Oral, resp. rate 18, height 6' 3" (1.905 m), weight 155 kg (341 lb 11.4 oz), SpO2 100 %.  General exam: Appears calm and comfortable  Respiratory system: Clear to auscultation. Respiratory effort normal. Cardiovascular system: NR/RR, no significant pitting edema Gastrointestinal system: Obese abdomen is compressible. No guarding. Central nervous system: Alert and oriented. No focal neurological deficits. Skin: Dry GU: Scrotal edema and erythema improved Psychiatry: Judgement and insight appear normal. Mood & affect appropriate.     Follow-up Information    Follow up with Irven Shelling, MD. Schedule an appointment as soon as possible for a visit in 3 days.   Specialty:  Internal Medicine   Contact information:   [address] [place] [address] [phone]       Signed: Reyne Dumas 01/16/2016, 12:04 PM        Time spent >45 mins

## 2016-01-16 NOTE — Progress Notes (Signed)
Peripherally Inserted Central Catheter/Midline Placement  The IV Nurse has discussed with the patient and/or persons authorized to consent for the patient, the purpose of this procedure and the potential benefits and risks involved with this procedure.  The benefits include less needle sticks, lab draws from the catheter and patient may be discharged home with the catheter.  Risks include, but not limited to, infection, bleeding, blood clot (thrombus formation), and puncture of an artery; nerve damage and irregular heat beat.  Alternatives to this procedure were also discussed.  PICC/Midline Placement Documentation        [name] 01/16/2016, 11:48 AM

## 2016-01-16 NOTE — Progress Notes (Signed)
Physical Therapy Treatment Patient Details Name: [name] MRN: [id number] DOB: 1946-03-28 Today's Date: 01/16/2016    History of Present Illness 70 year old morbidly obese male with history of stroke, mostly nonambulatory, hypertension, hyperlipidemia, diabetes mellitus, depression, chronic indwelling Foley, CAD, diastolic CHF, DVT not on anticoagulation, OSA (not on CPAP), chronic back pain presented with scrotal pain and altered mental status.  Found positive for UTI/sepsis.    PT Comments    Patient led through Bilat UE and LE therex. Pt with syncopal episode for a few seconds and oriented X4 post episode. RN present.   Follow Up Recommendations  No PT follow up     Equipment Recommendations  None recommended by PT    Recommendations for Other Services       Precautions / Restrictions Precautions Precautions: Fall Restrictions Weight Bearing Restrictions: No    Mobility  Bed Mobility                  Transfers                    Ambulation/Gait                 Stairs            Wheelchair Mobility    Modified Rankin (Stroke Patients Only)       Balance                                    Cognition Arousal/Alertness: Awake/alert Behavior During Therapy: WFL for tasks assessed/performed Overall Cognitive Status: Within Functional Limits for tasks assessed                      Exercises General Exercises - Upper Extremity Shoulder Flexion: AROM;Both;20 reps;Theraband Shoulder ABduction: AROM;Both;20 reps;Theraband Shoulder Horizontal ABduction: AROM;Both;20 reps;Theraband Shoulder Horizontal ADduction: AROM;Both;20 reps;Theraband Elbow Flexion: AROM;Both;20 reps;Theraband General Exercises - Lower Extremity Ankle Circles/Pumps: AROM;Both;20 reps Quad Sets: AROM;Both;20 reps;Supine Heel Slides: AROM;Both;20 reps Hip ABduction/ADduction: AROM;Both;20 reps Straight Leg Raises: AROM;Both;20 reps    General Comments General comments (skin integrity, edema, etc.): during Bilat LE therex pt appeared to experience syncopal episode for a few seconds and when came to looked confused but pt oriented to person, place, time and remembered task performed before episode; SpO2 98% on RA and HR 83; RN present       Pertinent Vitals/Pain Pain Assessment: Faces Faces Pain Scale: No hurt Pain Intervention(s): Monitored during session    Home Living                      Prior Function            PT Goals (current goals can now be found in the care plan section) Acute Rehab PT Goals Patient Stated Goal: To help strength Progress towards PT goals: Progressing toward goals    Frequency  Min 2X/week    PT Plan Current plan remains appropriate    Co-evaluation             End of Session   Activity Tolerance: Other (comment) (syncopal episode; RN present) Patient left: in bed;with call bell/phone within reach;with bed alarm set     Time: [id number] PT Time Calculation (min) (ACUTE ONLY): 29 min  Charges:  $Therapeutic Exercise: 23-37 mins  G Codes:      Salina April, PTA Pager: [phone]    01/16/2016, 9:49 AM

## 2016-01-16 NOTE — Progress Notes (Signed)
Patient will discharge to [facility] Anticipated discharge date: 5/8 Family notified: pt brother Curator by [employer]- scheduled for 1:30pm  [name] signing off.  Domenica Reamer, [place] Social Worker [phone]

## 2016-01-17 ENCOUNTER — Encounter: Payer: Self-pay | Admitting: Adult Health

## 2016-01-17 ENCOUNTER — Non-Acute Institutional Stay (SKILLED_NURSING_FACILITY): Payer: Medicare Other | Admitting: Adult Health

## 2016-01-17 DIAGNOSIS — F329 Major depressive disorder, single episode, unspecified: Secondary | ICD-10-CM

## 2016-01-17 DIAGNOSIS — N390 Urinary tract infection, site not specified: Secondary | ICD-10-CM

## 2016-01-17 DIAGNOSIS — E1165 Type 2 diabetes mellitus with hyperglycemia: Secondary | ICD-10-CM | POA: Diagnosis not present

## 2016-01-17 DIAGNOSIS — I5032 Chronic diastolic (congestive) heart failure: Secondary | ICD-10-CM

## 2016-01-17 DIAGNOSIS — N319 Neuromuscular dysfunction of bladder, unspecified: Secondary | ICD-10-CM | POA: Diagnosis not present

## 2016-01-17 DIAGNOSIS — IMO0002 Reserved for concepts with insufficient information to code with codable children: Secondary | ICD-10-CM

## 2016-01-17 DIAGNOSIS — E1151 Type 2 diabetes mellitus with diabetic peripheral angiopathy without gangrene: Secondary | ICD-10-CM | POA: Diagnosis not present

## 2016-01-17 DIAGNOSIS — F32A Depression, unspecified: Secondary | ICD-10-CM

## 2016-01-17 DIAGNOSIS — E1141 Type 2 diabetes mellitus with diabetic mononeuropathy: Secondary | ICD-10-CM | POA: Diagnosis not present

## 2016-01-17 DIAGNOSIS — G2581 Restless legs syndrome: Secondary | ICD-10-CM | POA: Diagnosis not present

## 2016-01-17 DIAGNOSIS — N453 Epididymo-orchitis: Secondary | ICD-10-CM

## 2016-01-17 DIAGNOSIS — T83511A Infection and inflammatory reaction due to indwelling urethral catheter, initial encounter: Secondary | ICD-10-CM

## 2016-01-17 NOTE — Progress Notes (Signed)
Patient ID: [name], male   DOB: 07/31/1946, 70 y.o.   MRN: [id number]   Facility:  [facility]     CODE STATUS: DNR  Allergies  Allergen Reactions  . Ace Inhibitors Swelling    Pt tolerates lisinopril  . Lipitor [Atorvastatin Calcium] Swelling  . Metformin And Related Swelling  . Cefadroxil Hives  . Cephalexin Hives  . Morphine And Related Other (See Comments)    Sweating, feels like is "in rocky boat."  . Robaxin [Methocarbamol] Other (See Comments)    Feels like he is shaky    Chief Complaint  Patient presents with  . Hospitalization Follow-up    Hospital Follow up    HPI:  He is a long term resident of this facility who has been hospitalized for uti due to esbl e-coli and klebsiella pneumoniae uti and epididymoorchitis. He is weak today and is unable to fully participate in the hpi; but did deny pain. There are no nursing concerns at this time.    Past Medical History  Diagnosis Date  . Obese   . Hypertension   . Diabetic neuropathy, painful ([place])   . Chronic indwelling Foley catheter   . Bladder spasms   . Hyperlipidemia   . GERD (gastroesophageal reflux disease)   . Neurogenic bladder   . CAD (coronary artery disease)   . PONV (postoperative nausea and vomiting)   . CHF (congestive heart failure) ([place])   . DVT (deep venous thrombosis) ([facility]) 1980's    LLE  . Type II diabetes mellitus ([place])   . Diabetic diarrhea ([place])   . Pneumonia 1999  . OSA (obstructive sleep apnea)     "they wanted me to wear a mask; I couldn't" (11/10/2014)  . Arthritis     "hands, ankles, knees" (11/10/2014)  . Chronic lower back pain   . Stroke [facility]) 10/2011; 06/2014    right hand numbness/notes 10/20/2011, pt not sure he had stroke in 2013; "right hand weaker and right face not quite right since" (11/10/2014)  . Depression     Past Surgical History  Procedure Laterality Date  . Gastroplasty    . Total knee arthroplasty Right 1990's  . Left heart catheterization with coronary  angiogram N/A 10/24/2011    Procedure: LEFT HEART CATHETERIZATION WITH CORONARY ANGIOGRAM;  Surgeon: Candee Furbish, MD;  Location: [facility] CATH LAB;  Service: Cardiovascular;  Laterality: N/A;  right radial artery approach  . Cholecystectomy open  1970's?  . Joint replacement    . Carpal tunnel release Bilateral ~ 2003-2004  . Cardiac catheterization N/A 05/02/2015    Procedure: Left Heart Cath and Coronary Angiography;  Surgeon: Lorretta Harp, MD;  Location: [place] CV LAB;  Service: Cardiovascular;  Laterality: N/A;  . Cardiac catheterization N/A 05/04/2015    Procedure: Left Heart Cath and Coronary Angiography;  Surgeon: Wellington Hampshire, MD;  Location: [place] CV LAB;  Service: Cardiovascular;  Laterality: N/A;    Social History   Social History  . Marital Status: Widowed    Spouse Name: N/A  . Number of Children: N/A  . Years of Education: N/A   Occupational History  . Retired    Social History Main Topics  . Smoking status: Former Smoker -- 2.00 packs/day for 20 years    Types: Cigarettes    Quit date: 09/10/1976  . Smokeless tobacco: Never Used  . 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 Drug Use: No  . Sexual  Activity: No   Other Topics Concern  . Not on file   Social History Narrative   Lives alone.  No close family nearby.     Family History  Problem Relation Age of Onset  . Hypertension Mother   . Diabetes type I Father   . Cancer Brother      Testicular Cancer  . Cancer Brother     Leukemia    Immunization History  Administered Date(s) Administered  . Influenza Split 10/12/2011, 10/13/2011  . Influenza,inj,Quad PF,36+ Mos 05/06/2015  . Influenza-Unspecified 06/10/2014  . PPD Test 10/13/2015     VITAL SIGNS BP 142/81 mmHg  Pulse 73  Temp(Src) 98.2 F (36.8 C) (Oral)  Resp 19  Ht 6\' 3"  (1.905 m)  Wt 341 lb 5 oz (154.818 kg)  BMI 42.66 kg/m2  SpO2 98%  Patient's Medications  New Prescriptions   No  medications on file  Previous Medications   ALUM & MAG HYDROXIDE-SIMETH (MAALOX/MYLANTA) 200-200-20 MG/5ML SUSPENSION    Take 30 mLs by mouth every 6 (six) hours as needed for indigestion or heartburn (dyspepsia).   AMINO ACIDS-PROTEIN HYDROLYS (PRO-STAT) LIQD    Take 1 Can by mouth 3 (three) times daily.   ASPIRIN EC 81 MG TABLET    Take 81 mg by mouth every morning.    ERTAPENEM 1 G IN SODIUM CHLORIDE 0.9 % 50 ML    Inject 1 g into the vein daily.   ESCITALOPRAM (LEXAPRO) 10 MG TABLET    Take 10 mg by mouth every morning.   FEXOFENADINE (ALLEGRA) 180 MG TABLET    Take 180 mg by mouth daily as needed for allergies or rhinitis.   FUROSEMIDE (LASIX) 40 MG TABLET    Take 1 tablet (40 mg total) by mouth 2 (two) times daily.   GABAPENTIN (NEURONTIN) 100 MG CAPSULE    Take 1 capsule (100 mg total) by mouth 2 (two) times daily.   HYDROXYZINE (ATARAX/VISTARIL) 25 MG TABLET    Take 25 mg by mouth 2 (two) times daily.   INSULIN NPH HUMAN (HUMULIN N) 100 UNIT/ML INJECTION    Inject 0.28 mLs (28 Units total) into the skin 2 (two) times daily at 8 am and 10 pm.   MAGNESIUM OXIDE (MAG-OX) 400 (241.3 MG) MG TABLET    Take 1 tablet (400 mg total) by mouth daily.   METOPROLOL (LOPRESSOR) 25 MG TABLET    Take 0.5 tablets (12.5 mg total) by mouth 2 (two) times daily.   MUPIROCIN CREAM (BACTROBAN) 2 %    Apply topically 2 (two) times daily.   NITROGLYCERIN (NITROSTAT) 0.4 MG SL TABLET    Place 1 tablet (0.4 mg total) under the tongue every 5 (five) minutes as needed for chest pain.   OMEPRAZOLE (PRILOSEC) 40 MG CAPSULE    Take 1 capsule (40 mg total) by mouth daily.   POTASSIUM CHLORIDE (KLOR-CON) 20 MEQ PACKET    Take 40 mEq by mouth every morning.   ROPINIROLE (REQUIP) 0.5 MG TABLET    Take 0.5 mg by mouth at bedtime.   SENNA-DOCUSATE (SENOKOT-S) 8.6-50 MG TABLET    Take 2 tablets by mouth at bedtime.   TRAMADOL-ACETAMINOPHEN (ULTRACET) 37.5-325 MG TABLET    Take 2 tablets by mouth every 6 (six) hours as needed  for moderate pain.   VITAMIN C (ASCORBIC ACID) 500 MG TABLET    Take 500 mg by mouth 2 (two) times daily.  Modified Medications   No medications on file  Discontinued Medications  No medications on file     SIGNIFICANT DIAGNOSTIC EXAMS  10-12-15: chest x-ray: Enlarged cardiac silhouette. Pulmonary vascular congestion.  10-18-15: EEG: This is a normal EEG for the patients stated age.  There were no focal, hemispheric or lateralizing features.  No epileptiform activity was recorded.  A normal EEG does not exclude the diagnosis of a seizure disorder and if seizure remains high on the list of differential diagnosis, an ambulatory EEG may be of value.  Clinical correlation is required.  01-11-16: chest x-ray: Minimal left base atelectasis.  Otherwise, normal exam.  01-11-16: ct of head: No acute intracranial abnormality. Stable mild cerebral atrophy and chronic white matter disease. No definite acute cortical infarction.  01-11-16: scrotal ultrasound: 1. Left epididymo-orchitis. 2. Microlithiasis involving the left testicle. 3. Three adjacent complex cysts/spermatoceles involving the left epididymal head.   LABS REVIEWED:   09-10-15: hgb a1c 10.4 09-15-15; tsh 2.954 10-12-15; wbc 5.9; hgb 11.8; hct 36.1; mcv 93.5; pt 195; glucose 187; bun 8; creat 0.78; k+ 5.0; na++136 11-03-15: wbc 9.2; hgb 12.7; hct 39.7; mcv 89.3; plt 267; glucose 105; bun 17.2; creat 0.61; k+ 4.2; na++135  12-29-15: chol 180; ldl 115; trig 66; hdl 52; hgb a1c 8.8  01-11-16: wbc 19.6; hgb 13.6; hct 40.1; mcv 87.6; plt 232; glucose 255; bun 15; creat 0.67; k+ 4.7; na++132; liver normal albumin 2.9; blood culture: no growth; urine culture: e-coli-esbl; klebsiella pneumoniae  01-15-16: wbc 7.4; hgb 11.6; hct 35.7; mcv 89.9; plt 242; glucose 159; bun 5; creat 0.53; k+ 4.1; na++135       Review of Systems  Unable to perform ROS: medical condition is lethargic and weak     Physical Exam  Constitutional: He is oriented to person,  place, and time. No distress.  Obese   Eyes: Conjunctivae are normal.  Neck: Neck supple. No JVD present. No thyromegaly present.  Cardiovascular: Normal rate, regular rhythm and intact distal pulses.   Respiratory: Effort normal and breath sounds normal. No respiratory distress. He has no wheezes.  GI: Soft. Bowel sounds are normal. He exhibits no distension. There is no tenderness.  Genitourinary:  Has foley   Musculoskeletal: He exhibits edema.  Able to move all extremities Trace bilateral lower extremity edema     Lymphadenopathy:    He has no cervical adenopathy.  Neurological: alert  Skin: Skin is warm and dry. He is not diaphoretic. He does have excessively dry skin.          ASSESSMENT/ PLAN:   1. Gerd: will continue prilosec 40 mg daily   2. Diastolic heart failure: EF is 55-60%;  will continue lasix 40 mg twice daily with k+ 40 meq daily   3. Hypertension: will continue lopressor 12.5 mg twice daily   4. Neurogenic bladder: has chronic indwelling foley  Will monitor  5. RLS: is stable will continue requip 0.5 mg nightly   6. Diabetes: will continue NPH 28 units twice daily hgb a1c 8.8 is improved form 10.4   7. CAD: no complaint of chest pain; will continue asa 81 mg daily has ntg prn  8. Allergic rhinitis: will continue allegra 180 mg daily as needed  9. CVA: is neurologically stable will continue asa 81 mg daily   10. Depression: will continue lexapro 10 mg daily   11. Pruritis: has dry skin will continue atarax 25 mg twice daily   12. Constipation: will continue senna s 2 tabs daily   13. Peripheral neuropathy: will continue neurontin 100 mg twice  daily has ultracet 2 tabs every 6 hours as needed for pain.   14. UTI and epididymoorchitis: will complete ertapenem 1 gm daily for total of 7 days.     Will check cbc and cmp    Time spent with patient  50  minutes >50% time spent counseling; reviewing medical record; tests; labs; and developing future  plan of care      Ok Edwards NP [facility]  Contact [phone] Monday through Friday 8am- 5pm  After hours call [phone]

## 2016-01-19 ENCOUNTER — Encounter: Payer: Self-pay | Admitting: Internal Medicine

## 2016-01-19 ENCOUNTER — Non-Acute Institutional Stay (SKILLED_NURSING_FACILITY): Payer: Medicare Other | Admitting: Internal Medicine

## 2016-01-19 DIAGNOSIS — N3090 Cystitis, unspecified without hematuria: Secondary | ICD-10-CM | POA: Diagnosis not present

## 2016-01-19 DIAGNOSIS — A498 Other bacterial infections of unspecified site: Secondary | ICD-10-CM | POA: Diagnosis not present

## 2016-01-19 DIAGNOSIS — L8990 Pressure ulcer of unspecified site, unspecified stage: Secondary | ICD-10-CM | POA: Diagnosis not present

## 2016-01-19 DIAGNOSIS — I2510 Atherosclerotic heart disease of native coronary artery without angina pectoris: Secondary | ICD-10-CM | POA: Diagnosis not present

## 2016-01-19 DIAGNOSIS — K219 Gastro-esophageal reflux disease without esophagitis: Secondary | ICD-10-CM

## 2016-01-19 DIAGNOSIS — Z1612 Extended spectrum beta lactamase (ESBL) resistance: Secondary | ICD-10-CM

## 2016-01-19 DIAGNOSIS — A419 Sepsis, unspecified organism: Secondary | ICD-10-CM

## 2016-01-19 DIAGNOSIS — N453 Epididymo-orchitis: Secondary | ICD-10-CM | POA: Diagnosis not present

## 2016-01-19 DIAGNOSIS — E118 Type 2 diabetes mellitus with unspecified complications: Secondary | ICD-10-CM

## 2016-01-19 DIAGNOSIS — F329 Major depressive disorder, single episode, unspecified: Secondary | ICD-10-CM

## 2016-01-19 DIAGNOSIS — Z8673 Personal history of transient ischemic attack (TIA), and cerebral infarction without residual deficits: Secondary | ICD-10-CM

## 2016-01-19 DIAGNOSIS — B9689 Other specified bacterial agents as the cause of diseases classified elsewhere: Secondary | ICD-10-CM

## 2016-01-19 DIAGNOSIS — G9340 Encephalopathy, unspecified: Secondary | ICD-10-CM | POA: Diagnosis not present

## 2016-01-19 DIAGNOSIS — I5032 Chronic diastolic (congestive) heart failure: Secondary | ICD-10-CM | POA: Diagnosis not present

## 2016-01-19 DIAGNOSIS — F32A Depression, unspecified: Secondary | ICD-10-CM

## 2016-01-19 NOTE — Progress Notes (Deleted)
MRN: [id number] Name: Kelan Hollan Hauge  Sex: male Age: 70 y.o. DOB: [birth year]  [place] #: Karren Burly Facility/Room:216-A Level Of Care: SNF Provider:Alexander, Webb Silversmith MD Emergency Contacts: Extended Emergency Contact Information Primary Emergency Contact: Whiting,[name] Address: [address]          [place], [name] [address] [name] of [place] Mobile Phone: [phone] Relation: Friend Secondary Emergency Contact: [name] [place] Phone: [phone] Relation: None  Code Status: DNR  Allergies: Ace inhibitors; Lipitor; Metformin and related; Cefadroxil; Cephalexin; Morphine and related; and Robaxin  Chief Complaint  Patient presents with  . New Admit To SNF    HPI: Patient is 70 y.o. male who  Past Medical History  Diagnosis Date  . Obese   . Hypertension   . Diabetic neuropathy, painful ([place])   . Chronic indwelling Foley catheter   . Bladder spasms   . Hyperlipidemia   . GERD (gastroesophageal reflux disease)   . Neurogenic bladder   . CAD (coronary artery disease)   . PONV (postoperative nausea and vomiting)   . CHF (congestive heart failure) ([place])   . DVT (deep venous thrombosis) ([facility]) 1980's    LLE  . Type II diabetes mellitus ([place])   . Diabetic diarrhea ([place])   . Pneumonia 1999  . OSA (obstructive sleep apnea)     "they wanted me to wear a mask; I couldn't" (11/10/2014)  . Arthritis     "hands, ankles, knees" (11/10/2014)  . Chronic lower back pain   . Stroke [facility]) 10/2011; 06/2014    right hand numbness/notes 10/20/2011, pt not sure he had stroke in 2013; "right hand weaker and right face not quite right since" (11/10/2014)  . Depression   . CAD in native artery 09/15/2015    Past Surgical History  Procedure Laterality Date  . Gastroplasty    . Total knee arthroplasty Right 1990's  . Left heart catheterization with coronary angiogram N/A 10/24/2011    Procedure: LEFT HEART CATHETERIZATION WITH CORONARY ANGIOGRAM;  Surgeon: Candee Furbish,  MD;  Location: [facility] C[facility] LAB;  Service: Cardiovascular;  Laterality: N/A;  right radial artery approach  . Cholecystectomy open  1970's?  . Joint replacement    . Carpal tunnel release Bilateral ~ 2003-2004  . Cardiac catheterization N/A 05/02/2015    Procedure: Left Heart Cath and Coronary Angiography;  Surgeon: Lorretta Harp, MD;  Location: [facility] CV LAB;  Service: Cardiovascular;  Laterality: N/A;  . Cardiac catheterization N/A 05/04/2015    Procedure: Left Heart Cath and Coronary Angiography;  Surgeon: Wellington Hampshire, MD;  Location: [facility] CV LAB;  Service: Cardiovascular;  Laterality: N/A;      Medication List       This list is accurate as of: 01/19/16  2:10 PM.  Always use your most recent med list.               alum & mag hydroxide-simeth 200-200-20 MG/5ML suspension  Commonly known as:  MAALOX/MYLANTA  Take 30 mLs by mouth every 6 (six) hours as needed for indigestion or heartburn (dyspepsia).     aspirin EC 81 MG tablet  Take 81 mg by mouth every morning.     ertapenem 1 g in sodium chloride 0.9 % 50 mL  Inject 1 g into the vein daily.     escitalopram 10 MG tablet  Commonly known as:  LEXAPRO  Take 10 mg by mouth every morning.     fexofenadine 180 MG tablet  Commonly known  as:  ALLEGRA  Take 180 mg by mouth daily as needed for allergies or rhinitis.     furosemide 40 MG tablet  Commonly known as:  LASIX  Take 1 tablet (40 mg total) by mouth 2 (two) times daily.     gabapentin 100 MG capsule  Commonly known as:  NEURONTIN  Take 1 capsule (100 mg total) by mouth 2 (two) times daily.     hydrOXYzine 25 MG tablet  Commonly known as:  ATARAX/VISTARIL  Take 25 mg by mouth 2 (two) times daily.     insulin NPH Human 100 UNIT/ML injection  Commonly known as:  HUMULIN N  Inject 0.28 mLs (28 Units total) into the skin 2 (two) times daily at 8 am and 10 pm.     magnesium oxide 400 (241.3 Mg) MG tablet  Commonly known as:  MAG-OX  Take 1 tablet (400  mg total) by mouth daily.     metoprolol tartrate 25 MG tablet  Commonly known as:  LOPRESSOR  Take 0.5 tablets (12.5 mg total) by mouth 2 (two) times daily.     mupirocin cream 2 %  Commonly known as:  BACTROBAN  Apply topically 2 (two) times daily.     nitroGLYCERIN 0.4 MG SL tablet  Commonly known as:  NITROSTAT  Place 1 tablet (0.4 mg total) under the tongue every 5 (five) minutes as needed for chest pain.     omeprazole 40 MG capsule  Commonly known as:  PRILOSEC  Take 1 capsule (40 mg total) by mouth daily.     potassium chloride 20 MEQ packet  Commonly known as:  KLOR-CON  Take 40 mEq by mouth every morning.     PRO-STAT Liqd  Take 1 Can by mouth 3 (three) times daily.     rOPINIRole 0.5 MG tablet  Commonly known as:  REQUIP  Take 0.5 mg by mouth at bedtime.     senna-docusate 8.6-50 MG tablet  Commonly known as:  Senokot-S  Take 2 tablets by mouth at bedtime.     traMADol-acetaminophen 37.5-325 MG tablet  Commonly known as:  ULTRACET  Take 2 tablets by mouth every 6 (six) hours as needed for moderate pain.     vitamin C 500 MG tablet  Commonly known as:  ASCORBIC ACID  Take 500 mg by mouth 2 (two) times daily.        No orders of the defined types were placed in this encounter.    Immunization History  Administered Date(s) Administered  . Influenza Split 10/12/2011, 10/13/2011  . Influenza,inj,Quad PF,36+ Mos 05/06/2015  . Influenza-Unspecified 06/10/2014  . PPD Test 10/13/2015    Social History  Substance Use Topics  . Smoking status: Former Smoker -- 2.00 packs/day for 20 years    Types: Cigarettes    Quit date: 09/10/1976  . Smokeless tobacco: Never Used  . Alcohol Use: No     Comment: FORMER ALCOHOLIC; "sober since [id number]"    Family history is    Review of Systems  DATA OBTAINED: from patient, nurse, medical record, family member GENERAL:  no fevers, fatigue, appetite changes SKIN: No itching, rash or wounds EYES: No eye pain,  redness, discharge EARS: No earache, tinnitus, change in hearing NOSE: No congestion, drainage or bleeding  MOUTH/THROAT: No mouth or tooth pain, No sore throat RESPIRATORY: No cough, wheezing, SOB CARDIAC: No chest pain, palpitations, lower extremity edema  GI: No abdominal pain, No N/V/D or constipation, No heartburn or reflux  GU: No dysuria,  frequency or urgency, or incontinence  MUSCULOSKELETAL: No unrelieved bone/joint pain NEUROLOGIC: No headache, dizziness or focal weakness PSYCHIATRIC: No c/o anxiety or sadness   There were no vitals filed for this visit.  SpO2 Readings from Last 1 Encounters:  01/17/16 98%        Physical Exam  GENERAL APPEARANCE: Alert, conversant,  No acute distress.  SKIN: No diaphoresis rash HEAD: Normocephalic, atraumatic  EYES: Conjunctiva/lids clear. Pupils round, reactive. EOMs intact.  EARS: External exam WNL, canals clear. Hearing grossly normal.  NOSE: No deformity or discharge.  MOUTH/THROAT: Lips w/o lesions  RESPIRATORY: Breathing is even, unlabored. Lung sounds are clear   CARDIOVASCULAR: Heart RRR no murmurs, rubs or gallops. No peripheral edema.   GASTROINTESTINAL: Abdomen is soft, non-tender, not distended w/ normal bowel sounds. GENITOURINARY: Bladder non tender, not distended  MUSCULOSKELETAL: No abnormal joints or musculature NEUROLOGIC:  Cranial nerves 2-12 grossly intact. Moves all extremities  PSYCHIATRIC: Mood and affect appropriate to situation, no behavioral issues  Patient Active Problem List   Diagnosis Date Noted  . Altered mental status   . Acute encephalopathy 01/11/2016  . Sepsis ([place]) 01/11/2016  . UTI (lower urinary tract infection) 01/11/2016  . Epididymo-orchitis 01/11/2016  . Depression   . Rash and nonspecific skin eruption 11/07/2015  . GERD (gastroesophageal reflux disease) 10/22/2015  . Restless legs 10/22/2015  . Chronic venous stasis dermatitis of both lower extremities 10/08/2015  . CAD in native  artery 09/15/2015  . PVC's (premature ventricular contractions) 09/15/2015  . Sinus pause 09/14/2015  . Morbid obesity ([place]) 07/03/2015  . Type II diabetes mellitus with peripheral circulatory disorder, uncontrolled ([place]) 05/09/2015  . Hyperlipidemia LDL goal <70 05/02/2015  . Abnormal nuclear stress test 05/02/2015  . Neurogenic bladder 04/26/2015  . Pressure ulcer 04/15/2015  . UTI (urinary tract infection) due to urinary indwelling catheter ([place]) 04/14/2015  . History of DVT (deep vein thrombosis) 12/08/2014  . Chronic diastolic CHF (congestive heart failure) ([place]) 04/01/2014  . Chronic indwelling Foley catheter 12/23/2013  . CVA (cerebral infarction) 03/11/2012  . NSVT (nonsustained ventricular tachycardia) ([place]) 10/26/2011  . Diabetic neuropathy ([place]) 10/10/2011  . OSA (obstructive sleep apnea) 10/10/2011    CBC    Component Value Date/Time   WBC 7.4 01/15/2016 0513   WBC 9.2 11/03/2015   RBC 3.97* 01/15/2016 0513   HGB 11.6* 01/15/2016 0513   HCT 35.7* 01/15/2016 0513   PLT 242 01/15/2016 0513   MCV 89.9 01/15/2016 0513   LYMPHSABS 0.8 01/11/2016 1442   MONOABS 1.5* 01/11/2016 1442   EOSABS 0.5 01/11/2016 1442   BASOSABS 0.1 01/11/2016 1442    CMP     Component Value Date/Time   NA 135 01/15/2016 0513   NA 135* 11/15/2015   K 4.1 01/15/2016 0513   CL 99* 01/15/2016 0513   CO2 23 01/15/2016 0513   GLUCOSE 159* 01/15/2016 0513   BUN 15 01/15/2016 0513   BUN 17 11/15/2015   CREATININE 0.53* 01/15/2016 0513   CREATININE 0.6 11/15/2015   CREATININE 0.81 09/20/2014 1537   CALCIUM 8.2* 01/15/2016 0513   PROT 6.4* 01/11/2016 1442   ALBUMIN 2.9* 01/11/2016 1442   AST 17 01/11/2016 1442   ALT 13* 01/11/2016 1442   ALKPHOS 102 01/11/2016 1442   BILITOT 0.6 01/11/2016 1442   GFRNONAA >60 01/15/2016 0513   GFRAA >60 01/15/2016 0513    Lab Results  Component Value Date   HGBA1C 8.8 12/29/2015     Ct Head Wo Contrast  01/11/2016  CLINICAL DATA:  Altered mental  status, dysphagia EXAM: CT HEAD WITHOUT CONTRAST TECHNIQUE: Contiguous axial images were obtained from the base of the skull through the vertex without intravenous contrast. COMPARISON:  09/22/2015 FINDINGS: Brain: No intracranial hemorrhage, mass effect or midline shift. Mild cerebral atrophy again noted. Bilateral basal ganglia punctate calcifications are again noted. Stable mild periventricular chronic white matter disease. No acute cortical infarction. No mass lesion is noted on this unenhanced scan. Vascular: Mild atherosclerotic calcifications of carotid siphon again noted. Skull: No skull fracture is noted. Sinuses/Orbits: Paranasal sinuses and mastoid air cells are unremarkable. Other: None IMPRESSION: No acute intracranial abnormality. Stable mild cerebral atrophy and chronic white matter disease. No definite acute cortical infarction. Electronically Signed   By: Lahoma Crocker M.D.   On: 01/11/2016 16:53   US Scrotum  01/11/2016  CLINICAL DATA:  70 year old presenting with bilateral testicular pain and tenderness. EXAM: SCROTAL ULTRASOUND DOPPLER ULTRASOUND OF THE TESTICLES TECHNIQUE: Complete ultrasound examination of the testicles, epididymis, and other scrotal structures was performed. Color and spectral Doppler ultrasound were also utilized to evaluate blood flow to the testicles. COMPARISON:  None. FINDINGS: Right testicle Measurements: Approximately 4.7 x 2.3 x 3.6 cm. Normal parenchymal echotexture without mass or microlithiasis. Normal color Doppler flow without evidence of hyperemia. Left testicle Measurements: Approximately 5.1 x 2.7 x 3.2 cm. Normal parenchymal echotexture without evidence of mass. Microcalcifications throughout the left testicle. Hyperemia on color Doppler evaluation. Right epididymis: Normal in size and appearance without evidence of hyperemia. Left epididymis: 3 adjacent complex cysts/spermatoceles measuring approximately 5 mm, 10 mm and 8 mm respectively involving the  epididymal head. Hyperemia on color Doppler evaluation involving the entire epididymis. Hydrocele:  Absent bilaterally. Varicocele:  Absent bilaterally. Pulsed Doppler interrogation of both testes demonstrates normal low resistance arterial and venous waveforms bilaterally. IMPRESSION: 1. Left epididymo-orchitis. 2. Microlithiasis involving the left testicle. 3. Three adjacent complex cysts/spermatoceles involving the left epididymal head. Electronically Signed   By: Evangeline Dakin M.D.   On: 01/11/2016 18:38   [place] Art/ven Flow Abd Pelv Doppler  01/11/2016  CLINICAL DATA:  70 year old presenting with bilateral testicular pain and tenderness. EXAM: SCROTAL ULTRASOUND DOPPLER ULTRASOUND OF THE TESTICLES TECHNIQUE: Complete ultrasound examination of the testicles, epididymis, and other scrotal structures was performed. Color and spectral Doppler ultrasound were also utilized to evaluate blood flow to the testicles. COMPARISON:  None. FINDINGS: Right testicle Measurements: Approximately 4.7 x 2.3 x 3.6 cm. Normal parenchymal echotexture without mass or microlithiasis. Normal color Doppler flow without evidence of hyperemia. Left testicle Measurements: Approximately 5.1 x 2.7 x 3.2 cm. Normal parenchymal echotexture without evidence of mass. Microcalcifications throughout the left testicle. Hyperemia on color Doppler evaluation. Right epididymis: Normal in size and appearance without evidence of hyperemia. Left epididymis: 3 adjacent complex cysts/spermatoceles measuring approximately 5 mm, 10 mm and 8 mm respectively involving the epididymal head. Hyperemia on color Doppler evaluation involving the entire epididymis. Hydrocele:  Absent bilaterally. Varicocele:  Absent bilaterally. Pulsed Doppler interrogation of both testes demonstrates normal low resistance arterial and venous waveforms bilaterally. IMPRESSION: 1. Left epididymo-orchitis. 2. Microlithiasis involving the left testicle. 3. Three adjacent complex  cysts/spermatoceles involving the left epididymal head. Electronically Signed   By: Evangeline Dakin M.D.   On: 01/11/2016 18:38   Dg Chest Port 1 View  01/11/2016  CLINICAL DATA:  Sepsis.  Altered mental status. EXAM: PORTABLE CHEST 1 VIEW COMPARISON:  10/08/2015 and 10/12/2015 FINDINGS: The heart size and pulmonary vascularity are normal and the lungs are clear  except for minimal atelectasis at the left base posterior medially. No bone abnormality. IMPRESSION: Minimal left base atelectasis.  Otherwise, normal exam. Electronically Signed   By: Lorriane Shire M.D.   On: 01/11/2016 14:54    Not all labs, radiology exams or other studies done during hospitalization come through on my EPIC note; however they are reviewed by me.    Assessment and Plan  No problem-specific assessment & plan notes found for this encounter.   Inocencio Homes  MD

## 2016-01-19 NOTE — Progress Notes (Addendum)
MRN: [id number] Name: [name]  Sex: male Age: 70 y.o. DOB: 1946-07-06  [place] #: Karren Burly Facility/Room: 216-A Level Of Care: SNF Provider:Alexander, Webb Silversmith MD Emergency Contacts: Extended Emergency Contact Information Primary Emergency Contact: Whiting,[name] Address: [address]          [place], [place] [address] [name] of [place] Mobile Phone: [phone] Relation: Friend Secondary Emergency Contact: [name] [place] Phone: [phone] Relation: None  Code Status: FullCode  Allergies: Ace inhibitors; Lipitor; Metformin and related; Cefadroxil; Cephalexin; Morphine and related; and Robaxin  Chief Complaint  Patient presents with  . New Admit To SNF    HPI: Patient is 70 y.o. malemorbidly obese  with history of stroke, mostly nonambulatory, hypertension, hyperlipidemia, diabetes mellitus, depression, chronic indwelling Foley, CAD, diastolic CHF, DVT not on anticoagulation, OSA (not on CPAP), chronic back pain presented with scrotal pain and altered mental status. Patient does not remember the incident much says he was having pain in his testes for the last few days. found to be confused and minimally responsive. In the ED he was found to be septic with a low-grade fever, WBC of 19.6 K, tachycardic and tachypnea. UA was positive for UTI and on exam had erythema with swelling of the scrotum. Ultrasound of the scrotum showed left epididymoorchitis, microlithiasis involving the stented vessels and 3 adjacent complex cysts/spermatoceles involving left epididymal head.Patient admitted to [facility] from 5/3[facility]8 for sepsis due to UTI and epididymoorchitis. Pt is admitted to SNF with generalized weaknes for OT/PT. While at SNF pt will be followed for chronic diastolic CHF, tx with metoprolol and lasix, DM2, tx with insulin, and GERD, tx with prilosec.  Past Medical History  Diagnosis Date  . Obese   . Hypertension   . Diabetic neuropathy, painful ([place])   .  Chronic indwelling Foley catheter   . Bladder spasms   . Hyperlipidemia   . GERD (gastroesophageal reflux disease)   . Neurogenic bladder   . CAD (coronary artery disease)   . PONV (postoperative nausea and vomiting)   . CHF (congestive heart failure) ([place])   . DVT (deep venous thrombosis) ([facility]) 1980's    LLE  . Type II diabetes mellitus ([place])   . Diabetic diarrhea ([place])   . Pneumonia 1999  . OSA (obstructive sleep apnea)     "they wanted me to wear a mask; I couldn't" (11/10/2014)  . Arthritis     "hands, ankles, knees" (11/10/2014)  . Chronic lower back pain   . Stroke [facility]) 10/2011; 06/2014    right hand numbness/notes 10/20/2011, pt not sure he had stroke in 2013; "right hand weaker and right face not quite right since" (11/10/2014)  . Depression   . CAD in native artery 09/15/2015    Past Surgical History  Procedure Laterality Date  . Gastroplasty    . Total knee arthroplasty Right 1990's  . Left heart catheterization with coronary angiogram N/A 10/24/2011    Procedure: LEFT HEART CATHETERIZATION WITH CORONARY ANGIOGRAM;  Surgeon: Candee Furbish, MD;  Location: [facility] CATH LAB;  Service: Cardiovascular;  Laterality: N/A;  right radial artery approach  . Cholecystectomy open  1970's?  . Joint replacement    . Carpal tunnel release Bilateral ~ 2003-2004  . Cardiac catheterization N/A 05/02/2015    Procedure: Left Heart Cath and Coronary Angiography;  Surgeon: Lorretta Harp, MD;  Location: [facility] CV LAB;  Service: Cardiovascular;  Laterality: N/A;  . Cardiac catheterization N/A 05/04/2015    Procedure: Left Heart Cath  and Coronary Angiography;  Surgeon: Wellington Hampshire, MD;  Location: [facility] CV LAB;  Service: Cardiovascular;  Laterality: N/A;      Medication List       This list is accurate as of: 01/19/16 11:59 PM.  Always use your most recent med list.               alum & mag hydroxide-simeth 200-200-20 MG/5ML suspension  Commonly known as:  MAALOX/MYLANTA  Take 30 mLs  by mouth every 6 (six) hours as needed for indigestion or heartburn (dyspepsia).     aspirin EC 81 MG tablet  Take 81 mg by mouth every morning.     ertapenem 1 g in sodium chloride 0.9 % 50 mL  Inject 1 g into the vein daily.     escitalopram 10 MG tablet  Commonly known as:  LEXAPRO  Take 10 mg by mouth every morning.     fexofenadine 180 MG tablet  Commonly known as:  ALLEGRA  Take 180 mg by mouth daily as needed for allergies or rhinitis.     furosemide 40 MG tablet  Commonly known as:  LASIX  Take 1 tablet (40 mg total) by mouth 2 (two) times daily.     gabapentin 100 MG capsule  Commonly known as:  NEURONTIN  Take 1 capsule (100 mg total) by mouth 2 (two) times daily.     hydrOXYzine 25 MG tablet  Commonly known as:  ATARAX/VISTARIL  Take 25 mg by mouth 2 (two) times daily.     insulin NPH Human 100 UNIT/ML injection  Commonly known as:  HUMULIN N  Inject 0.28 mLs (28 Units total) into the skin 2 (two) times daily at 8 am and 10 pm.     magnesium oxide 400 (241.3 Mg) MG tablet  Commonly known as:  MAG-OX  Take 1 tablet (400 mg total) by mouth daily.     metoprolol tartrate 25 MG tablet  Commonly known as:  LOPRESSOR  Take 0.5 tablets (12.5 mg total) by mouth 2 (two) times daily.     mupirocin cream 2 %  Commonly known as:  BACTROBAN  Apply topically 2 (two) times daily.     nitroGLYCERIN 0.4 MG SL tablet  Commonly known as:  NITROSTAT  Place 1 tablet (0.4 mg total) under the tongue every 5 (five) minutes as needed for chest pain.     omeprazole 40 MG capsule  Commonly known as:  PRILOSEC  Take 1 capsule (40 mg total) by mouth daily.     potassium chloride 20 MEQ packet  Commonly known as:  KLOR-CON  Take 40 mEq by mouth every morning.     PRO-STAT Liqd  Take 1 Can by mouth 3 (three) times daily.     rOPINIRole 0.5 MG tablet  Commonly known as:  REQUIP  Take 0.5 mg by mouth at bedtime.     senna-docusate 8.6-50 MG tablet  Commonly known as:   Senokot-S  Take 2 tablets by mouth at bedtime.     traMADol-acetaminophen 37.5-325 MG tablet  Commonly known as:  ULTRACET  Take 2 tablets by mouth every 6 (six) hours as needed for moderate pain.     vitamin C 500 MG tablet  Commonly known as:  ASCORBIC ACID  Take 500 mg by mouth 2 (two) times daily.        No orders of the defined types were placed in this encounter.    Immunization History  Administered Date(s) Administered  .  Influenza Split 10/12/2011, 10/13/2011  . Influenza,inj,Quad PF,36+ Mos 05/06/2015  . Influenza-Unspecified 06/10/2014  . PPD Test 10/13/2015    Social History  Substance Use Topics  . Smoking status: Former Smoker -- 2.00 packs/day for 20 years    Types: Cigarettes    Quit date: 09/10/1976  . Smokeless tobacco: Never Used  . Alcohol Use: No     Comment: FORMER ALCOHOLIC; "sober since [id number]"    Family history is +HTN, DM1  Review of Systems  DATA OBTAINED: from patient, nurse GENERAL:  no fevers, fatigue, appetite changes SKIN: No itching, rash or wounds EYES: No eye pain, redness, discharge EARS: No earache, tinnitus, change in hearing NOSE: No congestion, drainage or bleeding  MOUTH/THROAT: No mouth or tooth pain, No sore throat RESPIRATORY: No cough, wheezing, SOB CARDIAC: No chest pain, palpitations, lower extremity edema  GI: No abdominal pain, No N/V/D or constipation, No heartburn or reflux  GU: No dysuria, frequency or urgency, or incontinence  MUSCULOSKELETAL: No unrelieved bone/joint pain NEUROLOGIC: No headache, dizziness or focal weakness PSYCHIATRIC: No c/o anxiety or sadness   Filed Vitals:   01/19/16 1455  BP: 138/53  Pulse: 73  Temp: 98.3 F (36.8 C)  Resp: 19    SpO2 Readings from Last 1 Encounters:  01/17/16 98%        Physical Exam  GENERAL APPEARANCE: Alert, No acute distress.  SKIN: No diaphoresis rash HEAD: Normocephalic, atraumatic  EYES: Conjunctiva/lids clear. Pupils round, reactive.  EOMs intact.  EARS: External exam WNL, canals clear. Hearing grossly normal.  NOSE: No deformity or discharge.  MOUTH/THROAT: Lips w/o lesions  RESPIRATORY: Breathing is even, unlabored. Lung sounds are clear   CARDIOVASCULAR: Heart RRR 3/6 systoloc murmurs, no rubs or gallops. No peripheral edema.   GASTROINTESTINAL: Abdomen is soft, non-tender, not distended w/ normal bowel sounds. GENITOURINARY: Bladder non tender, not distended, mild scrotal edema and erythema  MUSCULOSKELETAL: No abnormal joints or musculature NEUROLOGIC:  Cranial nerves 2-12 grossly intact. Moves all extremities  PSYCHIATRIC: Mood and affect appropriate to situation, no behavioral issues  Patient Active Problem List   Diagnosis Date Noted  . Infection due to ESBL-producing Escherichia coli 01/22/2016  . Klebsiella cystitis 01/22/2016  . H/O: CVA (cerebrovascular accident) 01/22/2016  . Altered mental status   . Acute encephalopathy 01/11/2016  . Sepsis ([place]) 01/11/2016  . UTI (lower urinary tract infection) 01/11/2016  . Epididymo-orchitis 01/11/2016  . Depression   . Rash and nonspecific skin eruption 11/07/2015  . GERD (gastroesophageal reflux disease) 10/22/2015  . Restless legs 10/22/2015  . Chronic venous stasis dermatitis of both lower extremities 10/08/2015  . CAD in native artery 09/15/2015  . PVC's (premature ventricular contractions) 09/15/2015  . Sinus pause 09/14/2015  . Morbid obesity ([place]) 07/03/2015  . Type II diabetes mellitus with peripheral circulatory disorder, uncontrolled ([place]) 05/09/2015  . Hyperlipidemia LDL goal <70 05/02/2015  . Abnormal nuclear stress test 05/02/2015  . Neurogenic bladder 04/26/2015  . Pressure ulcer 04/15/2015  . UTI (urinary tract infection) due to urinary indwelling catheter ([place]) 04/14/2015  . History of DVT (deep vein thrombosis) 12/08/2014  . Chronic diastolic CHF (congestive heart failure) ([place]) 04/01/2014  . Chronic indwelling Foley catheter 12/23/2013   . CVA (cerebral infarction) 03/11/2012  . NSVT (nonsustained ventricular tachycardia) ([place]) 10/26/2011  . DM (diabetes mellitus) with complications ([place]) [id number]  . Diabetic neuropathy ([place]) 10/10/2011  . OSA (obstructive sleep apnea) 10/10/2011    CBC    Component Value Date/Time   WBC 7.4 01/15/2016  0513   WBC 9.2 11/03/2015   RBC 3.97* 01/15/2016 0513   HGB 11.6* 01/15/2016 0513   HCT 35.7* 01/15/2016 0513   PLT 242 01/15/2016 0513   MCV 89.9 01/15/2016 0513   LYMPHSABS 0.8 01/11/2016 1442   MONOABS 1.5* 01/11/2016 1442   EOSABS 0.5 01/11/2016 1442   BASOSABS 0.1 01/11/2016 1442    CMP     Component Value Date/Time   NA 135 01/15/2016 0513   NA 135* 11/15/2015   K 4.1 01/15/2016 0513   CL 99* 01/15/2016 0513   CO2 23 01/15/2016 0513   GLUCOSE 159* 01/15/2016 0513   BUN 15 01/15/2016 0513   BUN 17 11/15/2015   CREATININE 0.53* 01/15/2016 0513   CREATININE 0.6 11/15/2015   CREATININE 0.81 09/20/2014 1537   CALCIUM 8.2* 01/15/2016 0513   PROT 6.4* 01/11/2016 1442   ALBUMIN 2.9* 01/11/2016 1442   AST 17 01/11/2016 1442   ALT 13* 01/11/2016 1442   ALKPHOS 102 01/11/2016 1442   BILITOT 0.6 01/11/2016 1442   GFRNONAA >60 01/15/2016 0513   GFRAA >60 01/15/2016 0513    Lab Results  Component Value Date   HGBA1C 8.8 12/29/2015     Ct Head Wo Contrast  01/11/2016  CLINICAL DATA:  Altered mental status, dysphagia EXAM: CT HEAD WITHOUT CONTRAST TECHNIQUE: Contiguous axial images were obtained from the base of the skull through the vertex without intravenous contrast. COMPARISON:  09/22/2015 FINDINGS: Brain: No intracranial hemorrhage, mass effect or midline shift. Mild cerebral atrophy again noted. Bilateral basal ganglia punctate calcifications are again noted. Stable mild periventricular chronic white matter disease. No acute cortical infarction. No mass lesion is noted on this unenhanced scan. Vascular: Mild atherosclerotic calcifications of carotid siphon again  noted. Skull: No skull fracture is noted. Sinuses/Orbits: Paranasal sinuses and mastoid air cells are unremarkable. Other: None IMPRESSION: No acute intracranial abnormality. Stable mild cerebral atrophy and chronic white matter disease. No definite acute cortical infarction. Electronically Signed   By: Lahoma Crocker M.D.   On: 01/11/2016 16:53   US Scrotum  01/11/2016  CLINICAL DATA:  70 year old presenting with bilateral testicular pain and tenderness. EXAM: SCROTAL ULTRASOUND DOPPLER ULTRASOUND OF THE TESTICLES TECHNIQUE: Complete ultrasound examination of the testicles, epididymis, and other scrotal structures was performed. Color and spectral Doppler ultrasound were also utilized to evaluate blood flow to the testicles. COMPARISON:  None. FINDINGS: Right testicle Measurements: Approximately 4.7 x 2.3 x 3.6 cm. Normal parenchymal echotexture without mass or microlithiasis. Normal color Doppler flow without evidence of hyperemia. Left testicle Measurements: Approximately 5.1 x 2.7 x 3.2 cm. Normal parenchymal echotexture without evidence of mass. Microcalcifications throughout the left testicle. Hyperemia on color Doppler evaluation. Right epididymis: Normal in size and appearance without evidence of hyperemia. Left epididymis: 3 adjacent complex cysts/spermatoceles measuring approximately 5 mm, 10 mm and 8 mm respectively involving the epididymal head. Hyperemia on color Doppler evaluation involving the entire epididymis. Hydrocele:  Absent bilaterally. Varicocele:  Absent bilaterally. Pulsed Doppler interrogation of both testes demonstrates normal low resistance arterial and venous waveforms bilaterally. IMPRESSION: 1. Left epididymo-orchitis. 2. Microlithiasis involving the left testicle. 3. Three adjacent complex cysts/spermatoceles involving the left epididymal head. Electronically Signed   By: Evangeline Dakin M.D.   On: 01/11/2016 18:38   [place] Art/ven Flow Abd Pelv Doppler  01/11/2016  CLINICAL DATA:   70 year old presenting with bilateral testicular pain and tenderness. EXAM: SCROTAL ULTRASOUND DOPPLER ULTRASOUND OF THE TESTICLES TECHNIQUE: Complete ultrasound examination of the testicles, epididymis, and other scrotal structures  was performed. Color and spectral Doppler ultrasound were also utilized to evaluate blood flow to the testicles. COMPARISON:  None. FINDINGS: Right testicle Measurements: Approximately 4.7 x 2.3 x 3.6 cm. Normal parenchymal echotexture without mass or microlithiasis. Normal color Doppler flow without evidence of hyperemia. Left testicle Measurements: Approximately 5.1 x 2.7 x 3.2 cm. Normal parenchymal echotexture without evidence of mass. Microcalcifications throughout the left testicle. Hyperemia on color Doppler evaluation. Right epididymis: Normal in size and appearance without evidence of hyperemia. Left epididymis: 3 adjacent complex cysts/spermatoceles measuring approximately 5 mm, 10 mm and 8 mm respectively involving the epididymal head. Hyperemia on color Doppler evaluation involving the entire epididymis. Hydrocele:  Absent bilaterally. Varicocele:  Absent bilaterally. Pulsed Doppler interrogation of both testes demonstrates normal low resistance arterial and venous waveforms bilaterally. IMPRESSION: 1. Left epididymo-orchitis. 2. Microlithiasis involving the left testicle. 3. Three adjacent complex cysts/spermatoceles involving the left epididymal head. Electronically Signed   By: Evangeline Dakin M.D.   On: 01/11/2016 18:38   Dg Chest Port 1 View  01/11/2016  CLINICAL DATA:  Sepsis.  Altered mental status. EXAM: PORTABLE CHEST 1 VIEW COMPARISON:  10/08/2015 and 10/12/2015 FINDINGS: The heart size and pulmonary vascularity are normal and the lungs are clear except for minimal atelectasis at the left base posterior medially. No bone abnormality. IMPRESSION: Minimal left base atelectasis.  Otherwise, normal exam. Electronically Signed   By: Lorriane Shire M.D.   On:  01/11/2016 14:54    Not all labs, radiology exams or other studies done during hospitalization come through on my EPIC note; however they are reviewed by me.    Assessment and Plan  Sepsis secondary to catheter associated UTI and epididymoorchitis Sepsis resolved.. Empiric vancomycin and aztreonam on admission. Transitioned to IV ertapenem For ESBL E coli , and Klebsiella UTI. Blood cultures NGTD. - Reportedly Foley catheter change in the ED. (As per patient catheter should have been changed every month but was not changed in the past 3 months at [facility]). PICC in anticipation of completing at least seven additional days of IV ertapenem at SNF SNF  - CONT ERTAPENUM for 7 more days through PICC; admitted for OT/PT  Acute encephalopathy Secondary to sepsis. Now resolved.   History of stroke Patient reports being nonambulatory due to "passing out when I stand up." Previously ambulated with a walker but reports that he is being moved off the rehab floor at his SNF facility because he has been unable to participate. PT following. Mild L ICA disease documented in 2016. Orthostatic vital signs were not documented, but preliminary report for carotid dopplers does not suggest significant stenosis. Patient does not seem overly concerned that he has not been walking. SNF - Continue aspirin as prophylaxis  Chronic diastolic CHF SNF - Euvolemic. Continue aspirin, beta blocker and Lasix.  Chronic hip/back pain SNF - Reports shooting pain down to the legs and is chronic. Improved with Neurontin and Ultracet.  Uncontrolled type 2 diabetes mellitus SNF - Last A1c of 10.4.; NPH to 26 units twice a day; sliding scale coverage.  GERD SNF - Continue prilosec 40 mg  Pressure ulcer 2x2 cm across bilateral buttocks, noninfected. Also has right heel ulcer 0.5x0.5cm. SNF - SNF -continue wound care recommendations  Coronary artery disease SNF - stable; Continue aspirin and beta  blocker  Depression SNF - cont lexapro 10 mg daily; not stated as uncontrolled   Time spent > 45 min;> 50% of time with patient was spent reviewing records, labs, tests and studies, counseling  and developing plan of care  Inocencio Homes  MD

## 2016-01-22 ENCOUNTER — Encounter: Payer: Self-pay | Admitting: Internal Medicine

## 2016-01-22 DIAGNOSIS — A498 Other bacterial infections of unspecified site: Secondary | ICD-10-CM | POA: Insufficient documentation

## 2016-01-22 DIAGNOSIS — B9689 Other specified bacterial agents as the cause of diseases classified elsewhere: Secondary | ICD-10-CM | POA: Insufficient documentation

## 2016-01-22 DIAGNOSIS — Z8673 Personal history of transient ischemic attack (TIA), and cerebral infarction without residual deficits: Secondary | ICD-10-CM | POA: Insufficient documentation

## 2016-01-22 DIAGNOSIS — Z1612 Extended spectrum beta lactamase (ESBL) resistance: Secondary | ICD-10-CM

## 2016-01-22 DIAGNOSIS — N3090 Cystitis, unspecified without hematuria: Secondary | ICD-10-CM

## 2016-01-26 ENCOUNTER — Encounter: Payer: Self-pay | Admitting: Internal Medicine

## 2016-01-26 NOTE — Progress Notes (Signed)
MRN: [id number] Name: [name]  Sex: male Age: 70 y.o. DOB: Feb 02, 1946  [place] #: Karren Burly Facility/Room: 216 A Level Of Care: SNF Provider: Noah Delaine. Sheppard Coil, MD Emergency Contacts: Extended Emergency Contact Information Primary Emergency Contact: Whiting,[name] Address: [address]          [place], [place] [address] [name] of [place] Phone: [phone] Relation: Friend Secondary Emergency Contact: [name] [place] of [place] Phone: [phone] Relation: None  Code Status: DNR  Allergies: Ace inhibitors; Lipitor; Metformin and related; Cefadroxil; Cephalexin; Morphine and related; and Robaxin  Chief Complaint  Patient presents with  . Acute Visit    Acute    HPI: Patient is 70 y.o. male who   Past Medical History  Diagnosis Date  . Obese   . Hypertension   . Diabetic neuropathy, painful ([place])   . Chronic indwelling Foley catheter   . Bladder spasms   . Hyperlipidemia   . GERD (gastroesophageal reflux disease)   . Neurogenic bladder   . CAD (coronary artery disease)   . PONV (postoperative nausea and vomiting)   . CHF (congestive heart failure) ([place])   . DVT (deep venous thrombosis) (HCC) 1980's    LLE  . Type II diabetes mellitus ([place])   . Diabetic diarrhea ([place])   . Pneumonia 1999  . OSA (obstructive sleep apnea)     "they wanted me to wear a mask; I couldn't" (11/10/2014)  . Arthritis     "hands, ankles, knees" (11/10/2014)  . Chronic lower back pain   . Stroke [facility]) 10/2011; 06/2014    right hand numbness/notes 10/20/2011, pt not sure he had stroke in 2013; "right hand weaker and right face not quite right since" (11/10/2014)  . Depression   . CAD in native artery 09/15/2015    Past Surgical History  Procedure Laterality Date  . Gastroplasty    . Total knee arthroplasty Right 1990's  . Left heart catheterization with coronary angiogram N/A 10/24/2011    Procedure: LEFT HEART CATHETERIZATION WITH CORONARY ANGIOGRAM;  Surgeon:  Candee Furbish, MD;  Location: [facility] CATH LAB;  Service: Cardiovascular;  Laterality: N/A;  right radial artery approach  . Cholecystectomy open  1970's?  . Joint replacement    . Carpal tunnel release Bilateral ~ 2003-2004  . Cardiac catheterization N/A 05/02/2015    Procedure: Left Heart Cath and Coronary Angiography;  Surgeon: Lorretta Harp, MD;  Location: [place] CV LAB;  Service: Cardiovascular;  Laterality: N/A;  . Cardiac catheterization N/A 05/04/2015    Procedure: Left Heart Cath and Coronary Angiography;  Surgeon: Wellington Hampshire, MD;  Location: [place] CV LAB;  Service: Cardiovascular;  Laterality: N/A;      Medication List       This list is accurate as of: 01/26/16  4:32 PM.  Always use your most recent med list.               alum & mag hydroxide-simeth 200-200-20 MG/5ML suspension  Commonly known as:  MAALOX/MYLANTA  Take 30 mLs by mouth every 6 (six) hours as needed for indigestion or heartburn (dyspepsia).     aspirin EC 81 MG tablet  Take 81 mg by mouth every morning.     ertapenem 1 g in sodium chloride 0.9 % 50 mL  Inject 1 g into the vein daily.     escitalopram 10 MG tablet  Commonly known as:  LEXAPRO  Take 10 mg by mouth every morning.  fexofenadine 180 MG tablet  Commonly known as:  ALLEGRA  Take 180 mg by mouth daily as needed for allergies or rhinitis.     furosemide 40 MG tablet  Commonly known as:  LASIX  Take 1 tablet (40 mg total) by mouth 2 (two) times daily.     gabapentin 100 MG capsule  Commonly known as:  NEURONTIN  Take 1 capsule (100 mg total) by mouth 2 (two) times daily.     hydrOXYzine 25 MG tablet  Commonly known as:  ATARAX/VISTARIL  Take 25 mg by mouth 2 (two) times daily.     insulin NPH Human 100 UNIT/ML injection  Commonly known as:  HUMULIN N  Inject 0.28 mLs (28 Units total) into the skin 2 (two) times daily at 8 am and 10 pm.     magnesium oxide 400 (241.3 Mg) MG tablet  Commonly known as:  MAG-OX  Take 1  tablet (400 mg total) by mouth daily.     metoprolol tartrate 25 MG tablet  Commonly known as:  LOPRESSOR  Take 0.5 tablets (12.5 mg total) by mouth 2 (two) times daily.     mupirocin cream 2 %  Commonly known as:  BACTROBAN  Apply topically 2 (two) times daily.     nitroGLYCERIN 0.4 MG SL tablet  Commonly known as:  NITROSTAT  Place 1 tablet (0.4 mg total) under the tongue every 5 (five) minutes as needed for chest pain.     omeprazole 40 MG capsule  Commonly known as:  PRILOSEC  Take 1 capsule (40 mg total) by mouth daily.     potassium chloride 20 MEQ packet  Commonly known as:  KLOR-CON  Take 40 mEq by mouth every morning.     PRO-STAT Liqd  Take 1 Can by mouth 3 (three) times daily.     rOPINIRole 0.5 MG tablet  Commonly known as:  REQUIP  Take 0.5 mg by mouth at bedtime.     senna-docusate 8.6-50 MG tablet  Commonly known as:  Senokot-S  Take 2 tablets by mouth at bedtime.     traMADol-acetaminophen 37.5-325 MG tablet  Commonly known as:  ULTRACET  Take 2 tablets by mouth every 6 (six) hours as needed for moderate pain.     vitamin C 500 MG tablet  Commonly known as:  ASCORBIC ACID  Take 500 mg by mouth 2 (two) times daily.        No orders of the defined types were placed in this encounter.    Immunization History  Administered Date(s) Administered  . Influenza Split 10/12/2011, 10/13/2011  . Influenza,inj,Quad PF,36+ Mos 05/06/2015  . Influenza-Unspecified 06/10/2014  . PPD Test 10/13/2015    Social History  Substance Use Topics  . Smoking status: Former Smoker -- 2.00 packs/day for 20 years    Types: Cigarettes    Quit date: 09/10/1976  . Smokeless tobacco: Never Used  . Alcohol Use: No     Comment: FORMER ALCOHOLIC; "sober since 123XX123"    Review of Systems  DATA OBTAINED: from patient, nurse, medical record, family member GENERAL:  no fevers, fatigue, appetite changes SKIN: No itching, rash HEENT: No complaint RESPIRATORY: No  cough, wheezing, SOB CARDIAC: No chest pain, palpitations, lower extremity edema  GI: No abdominal pain, No N/V/D or constipation, No heartburn or reflux  GU: No dysuria, frequency or urgency, or incontinence  MUSCULOSKELETAL: No unrelieved bone/joint pain NEUROLOGIC: No headache, dizziness  PSYCHIATRIC: No overt anxiety or sadness  Filed Vitals:  01/26/16 1616  BP: 164/90  Pulse: 74  Temp: 98.5 F (36.9 C)  Resp: 20    Physical Exam  GENERAL APPEARANCE: Alert, conversant, No acute distress  SKIN: No diaphoresis rash HEENT: Unremarkable RESPIRATORY: Breathing is even, unlabored. Lung sounds are clear   CARDIOVASCULAR: Heart RRR no murmurs, rubs or gallops. No peripheral edema  GASTROINTESTINAL: Abdomen is soft, non-tender, not distended w/ normal bowel sounds.  GENITOURINARY: Bladder non tender, not distended  MUSCULOSKELETAL: No abnormal joints or musculature NEUROLOGIC: Cranial nerves 2-12 grossly intact. Moves all extremities PSYCHIATRIC: Mood and affect appropriate to situation, no behavioral issues  Patient Active Problem List   Diagnosis Date Noted  . Infection due to ESBL-producing Escherichia coli 01/22/2016  . Klebsiella cystitis 01/22/2016  . H/O: CVA (cerebrovascular accident) 01/22/2016  . Altered mental status   . Acute encephalopathy 01/11/2016  . Sepsis ([place]) 01/11/2016  . UTI (lower urinary tract infection) 01/11/2016  . Epididymo-orchitis 01/11/2016  . Depression   . Rash and nonspecific skin eruption 11/07/2015  . GERD (gastroesophageal reflux disease) 10/22/2015  . Restless legs 10/22/2015  . Chronic venous stasis dermatitis of both lower extremities 10/08/2015  . CAD in native artery 09/15/2015  . PVC's (premature ventricular contractions) 09/15/2015  . Sinus pause 09/14/2015  . Morbid obesity ([place]) 07/03/2015  . Type II diabetes mellitus with peripheral circulatory disorder, uncontrolled ([place]) 05/09/2015  . Hyperlipidemia LDL goal <70 05/02/2015   . Abnormal nuclear stress test 05/02/2015  . Neurogenic bladder 04/26/2015  . Pressure ulcer 04/15/2015  . UTI (urinary tract infection) due to urinary indwelling catheter ([place]) 04/14/2015  . History of DVT (deep vein thrombosis) 12/08/2014  . Chronic diastolic CHF (congestive heart failure) ([place]) 04/01/2014  . Chronic indwelling Foley catheter 12/23/2013  . CVA (cerebral infarction) 03/11/2012  . NSVT (nonsustained ventricular tachycardia) ([place]) 10/26/2011  . DM (diabetes mellitus) with complications ([place]) [id number]  . Diabetic neuropathy ([place]) 10/10/2011  . OSA (obstructive sleep apnea) 10/10/2011    CBC    Component Value Date/Time   WBC 7.4 01/15/2016 0513   WBC 9.2 11/03/2015   RBC 3.97* 01/15/2016 0513   HGB 11.6* 01/15/2016 0513   HCT 35.7* 01/15/2016 0513   PLT 242 01/15/2016 0513   MCV 89.9 01/15/2016 0513   LYMPHSABS 0.8 01/11/2016 1442   MONOABS 1.5* 01/11/2016 1442   EOSABS 0.5 01/11/2016 1442   BASOSABS 0.1 01/11/2016 1442    CMP     Component Value Date/Time   NA 135 01/15/2016 0513   NA 135* 11/15/2015   K 4.1 01/15/2016 0513   CL 99* 01/15/2016 0513   CO2 23 01/15/2016 0513   GLUCOSE 159* 01/15/2016 0513   BUN 15 01/15/2016 0513   BUN 17 11/15/2015   CREATININE 0.53* 01/15/2016 0513   CREATININE 0.6 11/15/2015   CREATININE 0.81 09/20/2014 1537   CALCIUM 8.2* 01/15/2016 0513   PROT 6.4* 01/11/2016 1442   ALBUMIN 2.9* 01/11/2016 1442   AST 17 01/11/2016 1442   ALT 13* 01/11/2016 1442   ALKPHOS 102 01/11/2016 1442   BILITOT 0.6 01/11/2016 1442   GFRNONAA >60 01/15/2016 0513   GFRAA >60 01/15/2016 0513    Assessment and Plan  No problem-specific assessment & plan notes found for this encounter.   Noah Delaine. Sheppard Coil, MD

## 2016-01-28 NOTE — Progress Notes (Signed)
This encounter was created in error - please disregard.

## 2016-02-10 ENCOUNTER — Encounter: Payer: Self-pay | Admitting: Adult Health

## 2016-02-10 ENCOUNTER — Non-Acute Institutional Stay (SKILLED_NURSING_FACILITY): Payer: Medicare Other | Admitting: Adult Health

## 2016-02-10 DIAGNOSIS — I5032 Chronic diastolic (congestive) heart failure: Secondary | ICD-10-CM

## 2016-02-10 NOTE — Progress Notes (Signed)
Patient ID: [name], male   DOB: 04-Feb-1946, 70 y.o.   MRN: [id number]   Location:  [place] Room Number: 216-B Place of Service:  SNF (31)   CODE STATUS: DNR  Allergies  Allergen Reactions  . Ace Inhibitors Swelling    Pt tolerates lisinopril  . Lipitor [Atorvastatin Calcium] Swelling  . Metformin And Related Swelling  . Cefadroxil Hives  . Cephalexin Hives  . Morphine And Related Other (See Comments)    Sweating, feels like is "in rocky boat."  . Robaxin [Methocarbamol] Other (See Comments)    Feels like he is shaky    Chief Complaint  Patient presents with  . Acute Visit    Multiple complaints    HPI:  He is wanting off his fluid restriction.  He has told the staff that he will no longer take his medications until the restriction has been removed. We discussed at great length the need for his fluid restriction for his overall well being. He did verbalize understanding and still wants off the fluid restriction.    Past Medical History  Diagnosis Date  . Obese   . Hypertension   . Diabetic neuropathy, painful ([place])   . Chronic indwelling Foley catheter   . Bladder spasms   . Hyperlipidemia   . GERD (gastroesophageal reflux disease)   . Neurogenic bladder   . CAD (coronary artery disease)   . PONV (postoperative nausea and vomiting)   . CHF (congestive heart failure) ([place])   . DVT (deep venous thrombosis) ([facility]) 1980's    LLE  . Type II diabetes mellitus ([place])   . Diabetic diarrhea ([place])   . Pneumonia 1999  . OSA (obstructive sleep apnea)     "they wanted me to wear a mask; I couldn't" (11/10/2014)  . Arthritis     "hands, ankles, knees" (11/10/2014)  . Chronic lower back pain   . Stroke [facility]) 10/2011; 06/2014    right hand numbness/notes 10/20/2011, pt not sure he had stroke in 2013; "right hand weaker and right face not quite right since" (11/10/2014)  . Depression   . CAD in native artery 09/15/2015    Past Surgical  History  Procedure Laterality Date  . Gastroplasty    . Total knee arthroplasty Right 1990's  . Left heart catheterization with coronary angiogram N/A 10/24/2011    Procedure: LEFT HEART CATHETERIZATION WITH CORONARY ANGIOGRAM;  Surgeon: Candee Furbish, MD;  Location: [facility] CATH LAB;  Service: Cardiovascular;  Laterality: N/A;  right radial artery approach  . Cholecystectomy open  1970's?  . Joint replacement    . Carpal tunnel release Bilateral ~ 2003-2004  . Cardiac catheterization N/A 05/02/2015    Procedure: Left Heart Cath and Coronary Angiography;  Surgeon: Lorretta Harp, MD;  Location: [place] CV LAB;  Service: Cardiovascular;  Laterality: N/A;  . Cardiac catheterization N/A 05/04/2015    Procedure: Left Heart Cath and Coronary Angiography;  Surgeon: Wellington Hampshire, MD;  Location: [place] CV LAB;  Service: Cardiovascular;  Laterality: N/A;    Social History   Social History  . Marital Status: Widowed    Spouse Name: N/A  . Number of Children: N/A  . Years of Education: N/A   Occupational History  . Retired    Social History Main Topics  . Smoking status: Former Smoker -- 2.00 packs/day for 20 years    Types: Cigarettes    Quit date: 09/10/1976  . Smokeless tobacco: Never Used  .  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 Drug Use: No  . Sexual Activity: No   Other Topics Concern  . Not on file   Social History Narrative   Lives alone.  No close family nearby.     Family History  Problem Relation Age of Onset  . Hypertension Mother   . Diabetes type I Father   . Cancer Brother      Testicular Cancer  . Cancer Brother     Leukemia      VITAL SIGNS BP 164/90 mmHg  Pulse 74  Temp(Src) 98.5 F (36.9 C) (Oral)  Resp 20  Ht 6\' 1"  (1.854 m)  Wt 341 lb 5 oz (154.818 kg)  BMI 45.04 kg/m2  SpO2 98%  Patient's Medications  New Prescriptions   No medications on file  Previous Medications   ALUM & MAG HYDROXIDE-SIMETH  (MAALOX/MYLANTA) 200-200-20 MG/5ML SUSPENSION    Take 30 mLs by mouth every 6 (six) hours as needed for indigestion or heartburn (dyspepsia).   AMINO ACIDS-PROTEIN HYDROLYS (PRO-STAT) LIQD    Take 1 Can by mouth 3 (three) times daily.   ASPIRIN EC 81 MG TABLET    Take 81 mg by mouth every morning.    BISMUTH TRIBROMOPH-PETROLATUM (XEROFORM PETROLAT GAUZE 1"X8" EX)    Apply topically. Left foot every three days   ERTAPENEM 1 G IN SODIUM CHLORIDE 0.9 % 50 ML    Inject 1 g into the vein daily.   ESCITALOPRAM (LEXAPRO) 10 MG TABLET    Take 10 mg by mouth every morning.   FEXOFENADINE (ALLEGRA) 180 MG TABLET    Take 180 mg by mouth daily as needed for allergies or rhinitis.   FUROSEMIDE (LASIX) 40 MG TABLET    Take 1 tablet (40 mg total) by mouth 2 (two) times daily.   GABAPENTIN (NEURONTIN) 100 MG CAPSULE    Take 1 capsule (100 mg total) by mouth 2 (two) times daily.   HYDROXYZINE (ATARAX/VISTARIL) 25 MG TABLET    Take 25 mg by mouth 2 (two) times daily.   INSULIN NPH HUMAN (HUMULIN N) 100 UNIT/ML INJECTION    Inject 0.28 mLs (28 Units total) into the skin 2 (two) times daily at 8 am and 10 pm.   MAGNESIUM OXIDE (MAG-OX) 400 (241.3 MG) MG TABLET    Take 1 tablet (400 mg total) by mouth daily.   METOPROLOL (LOPRESSOR) 25 MG TABLET    Take 0.5 tablets (12.5 mg total) by mouth 2 (two) times daily.   MUPIROCIN CREAM (BACTROBAN) 2 %    Apply topically 2 (two) times daily.   NITROGLYCERIN (NITROSTAT) 0.4 MG SL TABLET    Place 1 tablet (0.4 mg total) under the tongue every 5 (five) minutes as needed for chest pain.   OMEPRAZOLE (PRILOSEC) 40 MG CAPSULE    Take 1 capsule (40 mg total) by mouth daily.   POTASSIUM CHLORIDE (KLOR-CON) 20 MEQ PACKET    Take 40 mEq by mouth every morning.   ROPINIROLE (REQUIP) 0.5 MG TABLET    Take 0.5 mg by mouth at bedtime.   SENNA-DOCUSATE (SENOKOT-S) 8.6-50 MG TABLET    Take 2 tablets by mouth at bedtime.   TRAMADOL-ACETAMINOPHEN (ULTRACET) 37.5-325 MG TABLET    Take 2 tablets  by mouth every 6 (six) hours as needed for moderate pain.   VITAMIN C (ASCORBIC ACID) 500 MG TABLET    Take 500 mg by mouth 2 (two) times daily.  Modified  Medications   No medications on file  Discontinued Medications   No medications on file     SIGNIFICANT DIAGNOSTIC EXAMS  10-12-15: chest x-ray: Enlarged cardiac silhouette. Pulmonary vascular congestion.  10-18-15: EEG: This is a normal EEG for the patients stated age.  There were no focal, hemispheric or lateralizing features.  No epileptiform activity was recorded.  A normal EEG does not exclude the diagnosis of a seizure disorder and if seizure remains high on the list of differential diagnosis, an ambulatory EEG may be of value.  Clinical correlation is required.  01-11-16: chest x-ray: Minimal left base atelectasis.  Otherwise, normal exam.  01-11-16: ct of head: No acute intracranial abnormality. Stable mild cerebral atrophy and chronic white matter disease. No definite acute cortical infarction.  01-11-16: scrotal ultrasound: 1. Left epididymo-orchitis. 2. Microlithiasis involving the left testicle. 3. Three adjacent complex cysts/spermatoceles involving the left epididymal head.   LABS REVIEWED:   09-10-15: hgb a1c 10.4 09-15-15; tsh 2.954 10-12-15; wbc 5.9; hgb 11.8; hct 36.1; mcv 93.5; pt 195; glucose 187; bun 8; creat 0.78; k+ 5.0; na++136 11-03-15: wbc 9.2; hgb 12.7; hct 39.7; mcv 89.3; plt 267; glucose 105; bun 17.2; creat 0.61; k+ 4.2; na++135  12-29-15: chol 180; ldl 115; trig 66; hdl 52; hgb a1c 8.8  01-11-16: wbc 19.6; hgb 13.6; hct 40.1; mcv 87.6; plt 232; glucose 255; bun 15; creat 0.67; k+ 4.7; na++132; liver normal albumin 2.9; blood culture: no growth; urine culture: e-coli-esbl; klebsiella pneumoniae  01-15-16: wbc 7.4; hgb 11.6; hct 35.7; mcv 89.9; plt 242; glucose 159; bun 5; creat 0.53; k+ 4.1; na++135      Review of Systems  Constitutional: Negative for malaise/fatigue.  Respiratory: Negative for cough and shortness  of breath.   Cardiovascular: Positive for leg swelling. Negative for chest pain and palpitations.  Gastrointestinal: Negative for heartburn, abdominal pain and constipation.  Musculoskeletal: Negative for myalgias, back pain and joint pain.  Skin: Negative.   Neurological: Negative for dizziness.  Psychiatric/Behavioral: The patient is not nervous/anxious.       Physical Exam  Constitutional: He is oriented to person, place, and time. No distress.  Obese   Eyes: Conjunctivae are normal.  Neck: Neck supple. No JVD present. No thyromegaly present.  Cardiovascular: Normal rate, regular rhythm and intact distal pulses.   Respiratory: Effort normal and breath sounds normal. No respiratory distress. He has no wheezes.  GI: Soft. Bowel sounds are normal. He exhibits no distension. There is no tenderness.  Genitourinary:  Has foley   Musculoskeletal: He exhibits edema.  Able to move all extremities Trace bilateral lower extremity edema     Lymphadenopathy:    He has no cervical adenopathy.  Neurological: alert  Skin: Skin is warm and dry. He is not diaphoretic. He does have excessively dry skin.          ASSESSMENT/ PLAN:  1. Diastolic heart failure: EF is 55-60%;  will continue lasix 40 mg twice daily with k+ 40 meq daily   Will remove his fluid restriction and will monitor his status.       Ok Edwards NP [facility]  Contact [phone] Monday through Friday 8am- 5pm  After hours call [phone]

## 2016-02-21 ENCOUNTER — Encounter: Payer: Self-pay | Admitting: Adult Health

## 2016-02-21 ENCOUNTER — Non-Acute Institutional Stay (SKILLED_NURSING_FACILITY): Payer: Medicare Other | Admitting: Adult Health

## 2016-02-21 DIAGNOSIS — E1165 Type 2 diabetes mellitus with hyperglycemia: Secondary | ICD-10-CM | POA: Diagnosis not present

## 2016-02-21 DIAGNOSIS — G9340 Encephalopathy, unspecified: Secondary | ICD-10-CM | POA: Diagnosis not present

## 2016-02-21 DIAGNOSIS — I5032 Chronic diastolic (congestive) heart failure: Secondary | ICD-10-CM

## 2016-02-21 DIAGNOSIS — I2510 Atherosclerotic heart disease of native coronary artery without angina pectoris: Secondary | ICD-10-CM

## 2016-02-21 DIAGNOSIS — Z960 Presence of urogenital implants: Secondary | ICD-10-CM

## 2016-02-21 DIAGNOSIS — K219 Gastro-esophageal reflux disease without esophagitis: Secondary | ICD-10-CM

## 2016-02-21 DIAGNOSIS — E1151 Type 2 diabetes mellitus with diabetic peripheral angiopathy without gangrene: Secondary | ICD-10-CM | POA: Diagnosis not present

## 2016-02-21 DIAGNOSIS — G2581 Restless legs syndrome: Secondary | ICD-10-CM

## 2016-02-21 DIAGNOSIS — Z978 Presence of other specified devices: Secondary | ICD-10-CM

## 2016-02-21 DIAGNOSIS — Z9289 Personal history of other medical treatment: Secondary | ICD-10-CM

## 2016-02-21 DIAGNOSIS — IMO0002 Reserved for concepts with insufficient information to code with codable children: Secondary | ICD-10-CM

## 2016-02-21 NOTE — Progress Notes (Signed)
Patient ID: [name], male   DOB: [birth year], 70 y.o.   MRN: [id number]   Location:  [facility] [facility] Room Number: 216-B Place of Service:  SNF (31)   CODE STATUS: DNR  Allergies  Allergen Reactions  . Ace Inhibitors Swelling    Pt tolerates lisinopril  . Lipitor [Atorvastatin Calcium] Swelling  . Metformin And Related Swelling  . Cefadroxil Hives  . Cephalexin Hives  . Morphine And Related Other (See Comments)    Sweating, feels like is "in rocky boat."  . Robaxin [Methocarbamol] Other (See Comments)    Feels like he is shaky    Chief Complaint  Patient presents with  . Medical Management of Chronic Issues    Follow up    HPI:  He is a long term resident of this facility being seen for the management of his chronic illnesses. Overall there is little change in his status. He is happy to be off the fluid restriction. He is not voicing any complaints or concerns at this time. His blood pressure is elevated and will require further medication adjustment.    Past Medical History  Diagnosis Date  . Obese   . Hypertension   . Diabetic neuropathy, painful ([place])   . Chronic indwelling Foley catheter   . Bladder spasms   . Hyperlipidemia   . GERD (gastroesophageal reflux disease)   . Neurogenic bladder   . CAD (coronary artery disease)   . PONV (postoperative nausea and vomiting)   . CHF (congestive heart failure) ([place])   . DVT (deep venous thrombosis) (H[facility]) 1980's    LLE  . Type II diabetes mellitus ([place])   . Diabetic diarrhea ([place])   . Pneumonia 1999  . OSA (obstructive sleep apnea)     "they wanted me to wear a mask; I couldn't" (11/10/2014)  . Arthritis     "hands, ankles, knees" (11/10/2014)  . Chronic lower back pain   . Stroke [facility]) 10/2011; 06/2014    right hand numbness/notes 10/20/2011, pt not sure he had stroke in 2013; "right hand weaker and right face not quite right since" (11/10/2014)  . Depression   . CAD in native artery  09/15/2015    Past Surgical History  Procedure Laterality Date  . Gastroplasty    . Total knee arthroplasty Right 1990's  . Left heart catheterization with coronary angiogram N/A 10/24/2011    Procedure: LEFT HEART CATHETERIZATION WITH CORONARY ANGIOGRAM;  Surgeon: Candee Furbish, MD;  Location: [facility] CATH LAB;  Service: Cardiovascular;  Laterality: N/A;  right radial artery approach  . Cholecystectomy open  1970's?  . Joint replacement    . Carpal tunnel release Bilateral ~ 2003-2004  . Cardiac catheterization N/A 05/02/2015    Procedure: Left Heart Cath and Coronary Angiography;  Surgeon: Lorretta Harp, MD;  Location: [place] CV LAB;  Service: Cardiovascular;  Laterality: N/A;  . Cardiac catheterization N/A 05/04/2015    Procedure: Left Heart Cath and Coronary Angiography;  Surgeon: Wellington Hampshire, MD;  Location: [place] CV LAB;  Service: Cardiovascular;  Laterality: N/A;    Social History   Social History  . Marital Status: Widowed    Spouse Name: N/A  . Number of Children: N/A  . Years of Education: N/A   Occupational History  . Retired    Social History Main Topics  . Smoking status: Former Smoker -- 2.00 packs/day for 20 years    Types: Cigarettes    Quit date: 09/10/1976  .  Smokeless tobacco: Never Used  . 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 Drug Use: No  . Sexual Activity: No   Other Topics Concern  . Not on file   Social History Narrative   Lives alone.  No close family nearby.     Family History  Problem Relation Age of Onset  . Hypertension Mother   . Diabetes type I Father   . Cancer Brother      Testicular Cancer  . Cancer Brother     Leukemia      VITAL SIGNS BP 164/90 mmHg  Pulse 74  Temp(Src) 98.5 F (36.9 C) (Oral)  Resp 20  Ht 6\' 1"  (1.854 m)  Wt 342 lb (155.13 kg)  BMI 45.13 kg/m2  SpO2 98%  Patient's Medications  New Prescriptions   No medications on file  Previous Medications   ALUM &  MAG HYDROXIDE-SIMETH (MAALOX/MYLANTA) 200-200-20 MG/5ML SUSPENSION    Take 30 mLs by mouth every 6 (six) hours as needed for indigestion or heartburn (dyspepsia).   AMINO ACIDS-PROTEIN HYDROLYS (PRO-STAT) LIQD    Take 1 Can by mouth 3 (three) times daily.   ASPIRIN EC 81 MG TABLET    Take 81 mg by mouth every morning.    BISMUTH TRIBROMOPH-PETROLATUM (XEROFORM PETROLAT GAUZE 1"X8" EX)    Apply topically. Left foot every three days   ESCITALOPRAM (LEXAPRO) 10 MG TABLET    Take 10 mg by mouth every morning.   FUROSEMIDE (LASIX) 40 MG TABLET    Take 1 tablet (40 mg total) by mouth 2 (two) times daily.   GABAPENTIN (NEURONTIN) 100 MG CAPSULE    Take 1 capsule (100 mg total) by mouth 2 (two) times daily.   HYDROXYZINE (ATARAX/VISTARIL) 25 MG TABLET    Take 25 mg by mouth 2 (two) times daily.   INSULIN NPH HUMAN (HUMULIN N) 100 UNIT/ML INJECTION    Inject 0.28 mLs (28 Units total) into the skin 2 (two) times daily at 8 am and 10 pm.   MAGNESIUM OXIDE (MAG-OX) 400 (241.3 MG) MG TABLET    Take 1 tablet (400 mg total) by mouth daily.   METOPROLOL (LOPRESSOR) 25 MG TABLET    Take 0.5 tablets (12.5 mg total) by mouth 2 (two) times daily.   MUPIROCIN CREAM (BACTROBAN) 2 %    Apply topically 2 (two) times daily.   NITROGLYCERIN (NITROSTAT) 0.4 MG SL TABLET    Place 1 tablet (0.4 mg total) under the tongue every 5 (five) minutes as needed for chest pain.   OMEPRAZOLE (PRILOSEC) 40 MG CAPSULE    Take 1 capsule (40 mg total) by mouth daily.   POTASSIUM CHLORIDE (KLOR-CON) 20 MEQ PACKET    Take 40 mEq by mouth every morning.   ROPINIROLE (REQUIP) 0.5 MG TABLET    Take 0.5 mg by mouth at bedtime.   SENNA-DOCUSATE (SENOKOT-S) 8.6-50 MG TABLET    Take 2 tablets by mouth at bedtime.   TRAMADOL-ACETAMINOPHEN (ULTRACET) 37.5-325 MG TABLET    Take 2 tablets by mouth every 6 (six) hours as needed for moderate pain.   VITAMIN C (ASCORBIC ACID) 500 MG TABLET    Take 500 mg by mouth 2 (two) times daily.  Modified Medications    No medications on file  Discontinued Medications   ERTAPENEM 1 G IN SODIUM CHLORIDE 0.9 % 50 ML    Inject 1 g into the vein daily.   FEXOFENADINE (ALLEGRA) 180 MG TABLET  Take 180 mg by mouth daily as needed for allergies or rhinitis. Reported on 02/21/2016     SIGNIFICANT DIAGNOSTIC EXAMS  10-12-15: chest x-ray: Enlarged cardiac silhouette. Pulmonary vascular congestion.  10-18-15: EEG: This is a normal EEG for the patients stated age.  There were no focal, hemispheric or lateralizing features.  No epileptiform activity was recorded.  A normal EEG does not exclude the diagnosis of a seizure disorder and if seizure remains high on the list of differential diagnosis, an ambulatory EEG may be of value.  Clinical correlation is required.  01-11-16: chest x-ray: Minimal left base atelectasis.  Otherwise, normal exam.  01-11-16: ct of head: No acute intracranial abnormality. Stable mild cerebral atrophy and chronic white matter disease. No definite acute cortical infarction.  01-11-16: scrotal ultrasound: 1. Left epididymo-orchitis. 2. Microlithiasis involving the left testicle. 3. Three adjacent complex cysts/spermatoceles involving the left epididymal head.   LABS REVIEWED:   09-10-15: hgb a1c 10.4 09-15-15; tsh 2.954 10-12-15; wbc 5.9; hgb 11.8; hct 36.1; mcv 93.5; pt 195; glucose 187; bun 8; creat 0.78; k+ 5.0; na++136 11-03-15: wbc 9.2; hgb 12.7; hct 39.7; mcv 89.3; plt 267; glucose 105; bun 17.2; creat 0.61; k+ 4.2; na++135  12-29-15: chol 180; ldl 115; trig 66; hdl 52; hgb a1c 8.8  01-11-16: wbc 19.6; hgb 13.6; hct 40.1; mcv 87.6; plt 232; glucose 255; bun 15; creat 0.67; k+ 4.7; na++132; liver normal albumin 2.9; blood culture: no growth; urine culture: e-coli-esbl; klebsiella pneumoniae  01-15-16: wbc 7.4; hgb 11.6; hct 35.7; mcv 89.9; plt 242; glucose 159; bun 5; creat 0.53; k+ 4.1; na++135       Review of Systems  Constitutional: Negative for malaise/fatigue.  Respiratory: Negative for  cough and shortness of breath.   Cardiovascular: Positive for leg swelling. Negative for chest pain and palpitations.  Gastrointestinal: Negative for heartburn, abdominal pain and constipation.  Musculoskeletal: Negative for myalgias, back pain and joint pain.  Skin: Negative.   Neurological: Negative for dizziness.  Psychiatric/Behavioral: The patient is not nervous/anxious.        Physical Exam  Constitutional: He is oriented to person, place, and time. No distress.  Obese   Eyes: Conjunctivae are normal.  Neck: Neck supple. No JVD present. No thyromegaly present.  Cardiovascular: Normal rate, regular rhythm and intact distal pulses.   Respiratory: Effort normal and breath sounds normal. No respiratory distress. He has no wheezes.  GI: Soft. Bowel sounds are normal. He exhibits no distension. There is no tenderness.  Genitourinary:  Has foley   Musculoskeletal: He exhibits edema.  Able to move all extremities Trace bilateral lower extremity edema     Lymphadenopathy:    He has no cervical adenopathy.  Neurological: alert  Skin: Skin is warm and dry. He is not diaphoretic. He does have excessively dry skin.          ASSESSMENT/ PLAN:   1. Gerd: will continue prilosec 40 mg daily   2. Diastolic heart failure: EF is 55-60%;  will continue lasix 40 mg twice daily with k+ 40 meq daily   3. Hypertension: will continue lopressor 12.5 mg twice daily   And will begin noravsc 5 mg daily and will check blood pressure twice daily   4. Neurogenic bladder: has chronic indwelling foley  Will monitor  5. RLS: is stable will continue requip 0.5 mg nightly   6. Diabetes: will continue NPH 28 units twice daily hgb a1c 8.8 is improved form 10.4   7. CAD: no complaint of  chest pain; will continue asa 81 mg daily has ntg prn  8. Allergic rhinitis: is currently off medications; will monitor  9. CVA: is neurologically stable will continue asa 81 mg daily   10. Depression: will  continue lexapro 10 mg daily   11. Pruritis: has dry skin will continue atarax 25 mg twice daily   12. Constipation: will continue senna s 2 tabs daily   13. Peripheral neuropathy: will continue neurontin 100 mg twice daily has ultracet 2 tabs every 6 hours as needed for pain.      Ok Edwards NP [facility]  Contact [phone] Monday through Friday 8am- 5pm  After hours call [phone]

## 2016-03-08 ENCOUNTER — Encounter: Payer: Self-pay | Admitting: Internal Medicine

## 2016-03-08 ENCOUNTER — Non-Acute Institutional Stay (SKILLED_NURSING_FACILITY): Payer: Medicare Other | Admitting: Internal Medicine

## 2016-03-08 DIAGNOSIS — R21 Rash and other nonspecific skin eruption: Secondary | ICD-10-CM | POA: Diagnosis not present

## 2016-03-08 NOTE — Progress Notes (Signed)
MRN: [id number] Name: [name]  Sex: male Age: 70 y.o. DOB: April 05, 1946  [place] #:  Facility/Room: [facility] / 216 A Level Of Care: SNF Provider: Noah Delaine. Sheppard Coil, MD Emergency Contacts: Extended Emergency Contact Information Primary Emergency Contact: Whiting,[name] Address: [address]          [place], [place] [address] [name] of [place] Phone: [phone] Relation: Friend Secondary Emergency Contact: [name] [place] Phone: [phone] Relation: None  Code Status: DNR  Allergies: Ace inhibitors; Lipitor; Metformin and related; Cefadroxil; Cephalexin; Morphine and related; and Robaxin  Chief Complaint  Patient presents with  . Acute Visit    Acute    HPI: Patient is 70 y.o. male who nursing asked me to see pt for a rash that has been ongoing for a week. Per pt he thinks it starts as a small bump, gets bigger, then itchy. He is not sure if any have gone away. Nursing does not report MS change, fever or other systemic sx.  Past Medical History  Diagnosis Date  . Obese   . Hypertension   . Diabetic neuropathy, painful ([place])   . Chronic indwelling Foley catheter   . Bladder spasms   . Hyperlipidemia   . GERD (gastroesophageal reflux disease)   . Neurogenic bladder   . CAD (coronary artery disease)   . PONV (postoperative nausea and vomiting)   . CHF (congestive heart failure) ([place])   . DVT (deep venous thrombosis) ([facility]) 1980's    LLE  . Type II diabetes mellitus ([place])   . Diabetic diarrhea ([place])   . Pneumonia 1999  . OSA (obstructive sleep apnea)     "they wanted me to wear a mask; I couldn't" (11/10/2014)  . Arthritis     "hands, ankles, knees" (11/10/2014)  . Chronic lower back pain   . Stroke [facility]) 10/2011; 06/2014    right hand numbness/notes 10/20/2011, pt not sure he had stroke in 2013; "right hand weaker and right face not quite right since" (11/10/2014)  . Depression   . CAD in native artery 09/15/2015    Past Surgical  History  Procedure Laterality Date  . Gastroplasty    . Total knee arthroplasty Right 1990's  . Left heart catheterization with coronary angiogram N/A 10/24/2011    Procedure: LEFT HEART CATHETERIZATION WITH CORONARY ANGIOGRAM;  Surgeon: Candee Furbish, MD;  Location: [facility] CATH LAB;  Service: Cardiovascular;  Laterality: N/A;  right radial artery approach  . Cholecystectomy open  1970's?  . Joint replacement    . Carpal tunnel release Bilateral ~ 2003-2004  . Cardiac catheterization N/A 05/02/2015    Procedure: Left Heart Cath and Coronary Angiography;  Surgeon: Lorretta Harp, MD;  Location: [place] CV LAB;  Service: Cardiovascular;  Laterality: N/A;  . Cardiac catheterization N/A 05/04/2015    Procedure: Left Heart Cath and Coronary Angiography;  Surgeon: Wellington Hampshire, MD;  Location: [place] CV LAB;  Service: Cardiovascular;  Laterality: N/A;      Medication List       This list is accurate as of: 03/08/16 11:59 PM.  Always use your most recent med list.               aluminum-magnesium hydroxide-simethicone I037812 MG/5ML Susp  Commonly known as:  MAALOX  Take 30 mLs by mouth every 6 (six) hours as needed.     aspirin EC 81 MG tablet  Take 81 mg by mouth daily. 0900     escitalopram  10 MG tablet  Commonly known as:  LEXAPRO  Take 10 mg by mouth daily. 1000     feeding supplement (PRO-STAT SUGAR FREE 64) Liqd  Take 30 mLs by mouth 3 (three) times daily with meals. Give 0900, 1500, 2100     furosemide 40 MG tablet  Commonly known as:  LASIX  Take 40 mg by mouth 2 (two) times daily. 0800 , 2100     gabapentin 100 MG capsule  Commonly known as:  NEURONTIN  Take 1 capsule (100 mg total) by mouth 2 (two) times daily.     hydrOXYzine 25 MG tablet  Commonly known as:  ATARAX/VISTARIL  Take 25 mg by mouth 2 (two) times daily. 1000 1800     insulin NPH Human 100 UNIT/ML injection  Commonly known as:  HUMULIN N,NOVOLIN N  Inject 28 Units into the skin 2 (two)  times daily. 800 am and 10pm     magnesium oxide 400 MG tablet  Commonly known as:  MAG-OX  Take 400 mg by mouth daily. 0900     metoprolol tartrate 25 MG tablet  Commonly known as:  LOPRESSOR  0.5 tablet by mouth two times a day 0900 , 2100     mupirocin cream 2 %  Commonly known as:  BACTROBAN  Apply 1 application topically 2 (two) times daily. Reported on 03/23/2016     nitroGLYCERIN 0.4 MG SL tablet  Commonly known as:  NITROSTAT  Place 0.4 mg under the tongue every 5 (five) minutes as needed for chest pain.     NORVASC 5 MG tablet  Generic drug:  amLODipine  Take 5 mg by mouth daily.     omeprazole 40 MG capsule  Commonly known as:  PRILOSEC  Take 40 mg by mouth daily. 0600     potassium chloride 20 MEQ packet  Commonly known as:  KLOR-CON  Take 40 mEq by mouth daily. 0800     rOPINIRole 0.5 MG tablet  Commonly known as:  REQUIP  Take 0.5 mg by mouth at bedtime. 2100     sennosides-docusate sodium 8.6-50 MG tablet  Commonly known as:  SENOKOT-S  Take 2 tablets by mouth at bedtime. 2100     traMADol-acetaminophen 37.5-325 MG tablet  Commonly known as:  ULTRACET  Take 2 tablets by mouth every 6 (six) hours as needed for moderate pain.     vitamin C 500 MG tablet  Commonly known as:  ASCORBIC ACID  Take 500 mg by mouth 2 (two) times daily. 0900 and 2100     XEROFORM PETROLAT GAUZE 1"X8" EX  Apply topically. Reported on 03/23/2016        Meds ordered this encounter  Medications  . aluminum-magnesium hydroxide-simethicone (MAALOX) I037812 MG/5ML SUSP    Sig: Take 30 mLs by mouth every 6 (six) hours as needed.  . Amino Acids-Protein Hydrolys (FEEDING SUPPLEMENT, PRO-STAT SUGAR FREE 64,) LIQD    Sig: Take 30 mLs by mouth 3 (three) times daily with meals. Give 0900, 1500, 2100  . aspirin EC 81 MG tablet    Sig: Take 81 mg by mouth daily. 0900  . escitalopram (LEXAPRO) 10 MG tablet    Sig: Take 10 mg by mouth daily. 1000  . furosemide (LASIX) 40 MG tablet     Sig: Take 40 mg by mouth 2 (two) times daily. 0800 , 2100  . hydrOXYzine (ATARAX/VISTARIL) 25 MG tablet    Sig: Take 25 mg by mouth 2 (two) times daily. 1000 1800  .  insulin NPH Human (HUMULIN N,NOVOLIN N) 100 UNIT/ML injection    Sig: Inject 28 Units into the skin 2 (two) times daily. 800 am and 10pm  . magnesium oxide (MAG-OX) 400 MG tablet    Sig: Take 400 mg by mouth daily. 0900  . metoprolol tartrate (LOPRESSOR) 25 MG tablet    Sig: 0.5 tablet by mouth two times a day 0900 , 2100  . DISCONTD: mupirocin cream (BACTROBAN) 2 %    Sig: Apply 1 application topically 2 (two) times daily. Reported on 03/23/2016  . nitroGLYCERIN (NITROSTAT) 0.4 MG SL tablet    Sig: Place 0.4 mg under the tongue every 5 (five) minutes as needed for chest pain.  Marland Kitchen omeprazole (PRILOSEC) 40 MG capsule    Sig: Take 40 mg by mouth daily. 0600  . potassium chloride (KLOR-CON) 20 MEQ packet    Sig: Take 40 mEq by mouth daily. 0800  . rOPINIRole (REQUIP) 0.5 MG tablet    Sig: Take 0.5 mg by mouth at bedtime. 2100  . sennosides-docusate sodium (SENOKOT-S) 8.6-50 MG tablet    Sig: Take 2 tablets by mouth at bedtime. 2100  . vitamin C (ASCORBIC ACID) 500 MG tablet    Sig: Take 500 mg by mouth 2 (two) times daily. 0900 and 2100  . amLODipine (NORVASC) 5 MG tablet    Sig: Take 5 mg by mouth daily.    Immunization History  Administered Date(s) Administered  . Influenza Split 10/12/2011, 10/13/2011  . Influenza,inj,Quad PF,36+ Mos 05/06/2015  . Influenza-Unspecified 06/10/2014  . PPD Test 10/13/2015    Social History  Substance Use Topics  . Smoking status: Former Smoker -- 2.00 packs/day for 20 years    Types: Cigarettes    Quit date: 09/10/1976  . Smokeless tobacco: Never Used  . Alcohol Use: No     Comment: FORMER ALCOHOLIC; "sober since [id number]"    Review of Systems  DATA OBTAINED: from patient, nurse GENERAL:  no fevers, fatigue, appetite changes SKIN: No itching, +rash as per HPI HEENT: No  complaint RESPIRATORY: No cough, wheezing, SOB CARDIAC: No chest pain, palpitations, lower extremity edema  GI: No abdominal pain, No N/V/D or constipation, No heartburn or reflux  GU: No dysuria, frequency or urgency, or incontinence  MUSCULOSKELETAL: No unrelieved bone/joint pain NEUROLOGIC: No headache, dizziness  PSYCHIATRIC: No overt anxiety or sadness  Filed Vitals:   03/08/16 1541  BP: 164/90  Pulse: 74  Temp: 98.5 F (36.9 C)  Resp: 20    Physical Exam  GENERAL APPEARANCE: Alert, conversant,obese WM, No acute distress  SKIN: round scabs on flesh colored bases; all aapear in same stage;no sighs of swelling or redness/infection HEENT: Unremarkable RESPIRATORY: Breathing is even, unlabored. Lung sounds are clear   CARDIOVASCULAR: Heart RRR no murmurs, rubs or gallops. No peripheral edema  GASTROINTESTINAL: Abdomen is soft, non-tender, not distended w/ normal bowel sounds.  GENITOURINARY: Bladder non tender, not distended  MUSCULOSKELETAL: No abnormal joints or musculature NEUROLOGIC: Cranial nerves 2-12 grossly intact. Moves all extremities PSYCHIATRIC: Mood and affect appropriate to situation, no behavioral issues  Patient Active Problem List   Diagnosis Date Noted  . Infection due to ESBL-producing Escherichia coli 01/22/2016  . Klebsiella cystitis 01/22/2016  . H/O: CVA (cerebrovascular accident) 01/22/2016  . Altered mental status   . Acute encephalopathy 01/11/2016  . Sepsis ([place]) 01/11/2016  . UTI (lower urinary tract infection) 01/11/2016  . Epididymo-orchitis 01/11/2016  . Depression   . Rash and nonspecific skin eruption 11/07/2015  . GERD (gastroesophageal  reflux disease) 10/22/2015  . Restless legs 10/22/2015  . Chronic venous stasis dermatitis of both lower extremities 10/08/2015  . CAD in native artery 09/15/2015  . PVC's (premature ventricular contractions) 09/15/2015  . Sinus pause 09/14/2015  . Morbid obesity ([place]) 07/03/2015  . Type II diabetes  mellitus with peripheral circulatory disorder, uncontrolled ([place]) 05/09/2015  . Hyperlipidemia LDL goal <70 05/02/2015  . Abnormal nuclear stress test 05/02/2015  . Neurogenic bladder 04/26/2015  . Pressure ulcer 04/15/2015  . UTI (urinary tract infection) due to urinary indwelling catheter ([place]) 04/14/2015  . History of DVT (deep vein thrombosis) 12/08/2014  . Chronic diastolic CHF (congestive heart failure) ([place]) 04/01/2014  . Chronic indwelling Foley catheter 12/23/2013  . CVA (cerebral infarction) 03/11/2012  . NSVT (nonsustained ventricular tachycardia) ([place]) 10/26/2011  . DM (diabetes mellitus) with complications ([place]) [id number]  . Diabetic neuropathy ([place]) 10/10/2011  . OSA (obstructive sleep apnea) 10/10/2011    CBC    Component Value Date/Time   WBC 7.4 01/15/2016 0513   WBC 9.2 11/03/2015   RBC 3.97* 01/15/2016 0513   HGB 11.6* 01/15/2016 0513   HCT 35.7* 01/15/2016 0513   PLT 242 01/15/2016 0513   MCV 89.9 01/15/2016 0513   LYMPHSABS 0.8 01/11/2016 1442   MONOABS 1.5* 01/11/2016 1442   EOSABS 0.5 01/11/2016 1442   BASOSABS 0.1 01/11/2016 1442    CMP     Component Value Date/Time   NA 135 01/15/2016 0513   NA 135* 11/15/2015   K 4.1 01/15/2016 0513   CL 99* 01/15/2016 0513   CO2 23 01/15/2016 0513   GLUCOSE 159* 01/15/2016 0513   BUN 15 01/15/2016 0513   BUN 17 11/15/2015   CREATININE 0.53* 01/15/2016 0513   CREATININE 0.6 11/15/2015   CREATININE 0.81 09/20/2014 1537   CALCIUM 8.2* 01/15/2016 0513   PROT 6.4* 01/11/2016 1442   ALBUMIN 2.9* 01/11/2016 1442   AST 17 01/11/2016 1442   ALT 13* 01/11/2016 1442   ALKPHOS 102 01/11/2016 1442   BILITOT 0.6 01/11/2016 1442   GFRNONAA >60 01/15/2016 0513   GFRAA >60 01/15/2016 0513    Assessment and Plan  RASH - does not look viral, does not look infectious, does not look like infestations; does not look like anything I have seen before, therefore probably steroid responsive. Have written for 6 days steroid  taper 60/50/40/30/20/10 and atarax 25 mg q8; will monitor.    Time spent > 25 min;> 50% of time with patient was spent reviewing records, labs, tests and studies, counseling and developing plan of care  Noah Delaine. Sheppard Coil, MD

## 2016-03-16 DIAGNOSIS — L89323 Pressure ulcer of left buttock, stage 3: Secondary | ICD-10-CM | POA: Diagnosis not present

## 2016-03-16 DIAGNOSIS — L97419 Non-pressure chronic ulcer of right heel and midfoot with unspecified severity: Secondary | ICD-10-CM | POA: Diagnosis not present

## 2016-03-23 ENCOUNTER — Encounter: Payer: Self-pay | Admitting: Adult Health

## 2016-03-23 ENCOUNTER — Non-Acute Institutional Stay (SKILLED_NURSING_FACILITY): Payer: Medicare Other | Admitting: Adult Health

## 2016-03-23 DIAGNOSIS — IMO0002 Reserved for concepts with insufficient information to code with codable children: Secondary | ICD-10-CM

## 2016-03-23 DIAGNOSIS — L97419 Non-pressure chronic ulcer of right heel and midfoot with unspecified severity: Secondary | ICD-10-CM | POA: Diagnosis not present

## 2016-03-23 DIAGNOSIS — E1165 Type 2 diabetes mellitus with hyperglycemia: Secondary | ICD-10-CM | POA: Diagnosis not present

## 2016-03-23 DIAGNOSIS — E1143 Type 2 diabetes mellitus with diabetic autonomic (poly)neuropathy: Secondary | ICD-10-CM

## 2016-03-23 DIAGNOSIS — L89323 Pressure ulcer of left buttock, stage 3: Secondary | ICD-10-CM | POA: Diagnosis not present

## 2016-03-23 NOTE — Progress Notes (Signed)
Patient ID: [name], male   DOB: 1946-02-14, 70 y.o.   MRN: [id number]   Location:   [place] Room Number: 216-B Place of Service:  SNF (31)   CODE STATUS: DNR  Allergies  Allergen Reactions  . Ace Inhibitors Swelling    Pt tolerates lisinopril  . Lipitor [Atorvastatin Calcium] Swelling  . Metformin And Related Swelling  . Cefadroxil Hives  . Cephalexin Hives  . Morphine And Related Other (See Comments)    Sweating, feels like is "in rocky boat."  . Robaxin [Methocarbamol] Other (See Comments)    Feels like he is shaky    Chief Complaint  Patient presents with  . Acute Visit    Diabetes    HPI:  I have been asked to review his diabetes. He does not want to change his insulin regimen to a basal bolus plan; he desires to continue nph therapy. We discussed other options and he has agreed to take tradjenta. His cbg readings are elevated. He denies any symptoms as this time.   Past Medical History  Diagnosis Date  . Obese   . Hypertension   . Diabetic neuropathy, painful ([place])   . Chronic indwelling Foley catheter   . Bladder spasms   . Hyperlipidemia   . GERD (gastroesophageal reflux disease)   . Neurogenic bladder   . CAD (coronary artery disease)   . PONV (postoperative nausea and vomiting)   . CHF (congestive heart failure) ([place])   . DVT (deep venous thrombosis) (H[facility]) 1980's    LLE  . Type II diabetes mellitus ([place])   . Diabetic diarrhea ([place])   . Pneumonia 1999  . OSA (obstructive sleep apnea)     "they wanted me to wear a mask; I couldn't" (11/10/2014)  . Arthritis     "hands, ankles, knees" (11/10/2014)  . Chronic lower back pain   . Stroke [facility]) 10/2011; 06/2014    right hand numbness/notes 10/20/2011, pt not sure he had stroke in 2013; "right hand weaker and right face not quite right since" (11/10/2014)  . Depression   . CAD in native artery 09/15/2015    Past Surgical History  Procedure Laterality Date  . Gastroplasty    . Total knee  arthroplasty Right 1990's  . Left heart catheterization with coronary angiogram N/A 10/24/2011    Procedure: LEFT HEART CATHETERIZATION WITH CORONARY ANGIOGRAM;  Surgeon: Candee Furbish, MD;  Location: [facility] CATH LAB;  Service: Cardiovascular;  Laterality: N/A;  right radial artery approach  . Cholecystectomy open  1970's?  . Joint replacement    . Carpal tunnel release Bilateral ~ 2003-2004  . Cardiac catheterization N/A 05/02/2015    Procedure: Left Heart Cath and Coronary Angiography;  Surgeon: Lorretta Harp, MD;  Location: [place] CV LAB;  Service: Cardiovascular;  Laterality: N/A;  . Cardiac catheterization N/A 05/04/2015    Procedure: Left Heart Cath and Coronary Angiography;  Surgeon: Wellington Hampshire, MD;  Location: [place] CV LAB;  Service: Cardiovascular;  Laterality: N/A;    Social History   Social History  . Marital Status: Widowed    Spouse Name: N/A  . Number of Children: N/A  . Years of Education: N/A   Occupational History  . Retired    Social History Main Topics  . Smoking status: Former Smoker -- 2.00 packs/day for 20 years    Types: Cigarettes    Quit date: 09/10/1976  . Smokeless tobacco: Never Used  . 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 Drug Use: No  . Sexual Activity: No   Other Topics Concern  . Not on file   Social History Narrative   Lives alone.  No close family nearby.     Family History  Problem Relation Age of Onset  . Hypertension Mother   . Diabetes type I Father   . Cancer Brother      Testicular Cancer  . Cancer Brother     Leukemia      VITAL SIGNS BP 156/89 mmHg  Pulse 90  Temp(Src) 98 F (36.7 C) (Oral)  Resp 18  Ht 6\' 1"  (1.854 m)  Wt 344 lb 9 oz (156.293 kg)  BMI 45.47 kg/m2  SpO2 98%  Patient's Medications  New Prescriptions   No medications on file  Previous Medications   ALUMINUM-MAGNESIUM HYDROXIDE-SIMETHICONE (MAALOX) 200-200-20 MG/5ML SUSP    Take 30 mLs by mouth  every 6 (six) hours as needed.   AMINO ACIDS-PROTEIN HYDROLYS (FEEDING SUPPLEMENT, PRO-STAT SUGAR FREE 64,) LIQD    Take 30 mLs by mouth 3 (three) times daily with meals. Give 0900, 1500, 2100   AMLODIPINE (NORVASC) 5 MG TABLET    Take 5 mg by mouth daily.   ASPIRIN EC 81 MG TABLET    Take 81 mg by mouth daily. 0900   ESCITALOPRAM (LEXAPRO) 10 MG TABLET    Take 10 mg by mouth daily. 1000   FUROSEMIDE (LASIX) 40 MG TABLET    Take 40 mg by mouth 2 (two) times daily. 0800 , 2100   GABAPENTIN (NEURONTIN) 100 MG CAPSULE    Take 1 capsule (100 mg total) by mouth 2 (two) times daily.   HYDROXYZINE (ATARAX/VISTARIL) 25 MG TABLET    Take 25 mg by mouth 2 (two) times daily. 1000 1800   INSULIN NPH HUMAN (HUMULIN N,NOVOLIN N) 100 UNIT/ML INJECTION    Inject 28 Units into the skin 2 (two) times daily. 800 am and 10pm   MAGNESIUM OXIDE (MAG-OX) 400 MG TABLET    Take 400 mg by mouth daily. 0900   METOPROLOL TARTRATE (LOPRESSOR) 25 MG TABLET    0.5 tablet by mouth two times a day 0900 , 2100   NITROGLYCERIN (NITROSTAT) 0.4 MG SL TABLET    Place 0.4 mg under the tongue every 5 (five) minutes as needed for chest pain.   OMEPRAZOLE (PRILOSEC) 40 MG CAPSULE    Take 40 mg by mouth daily. 0600   POTASSIUM CHLORIDE (KLOR-CON) 20 MEQ PACKET    Take 40 mEq by mouth daily. 0800   ROPINIROLE (REQUIP) 0.5 MG TABLET    Take 0.5 mg by mouth at bedtime. 2100   SENNOSIDES-DOCUSATE SODIUM (SENOKOT-S) 8.6-50 MG TABLET    Take 2 tablets by mouth at bedtime. 2100   TRAMADOL-ACETAMINOPHEN (ULTRACET) 37.5-325 MG TABLET    Take 2 tablets by mouth every 6 (six) hours as needed for moderate pain.   TRIAMCINOLONE CREAM (KENALOG) 0.1 %    Apply 1 application topically 2 (two) times daily.   VITAMIN C (ASCORBIC ACID) 500 MG TABLET    Take 500 mg by mouth 2 (two) times daily. 0900 and 2100   ZINC SULFATE 220 (50 ZN) MG CAPSULE    Take 220 mg by mouth daily.  Modified Medications   No medications on file  Discontinued Medications    BISMUTH TRIBROMOPH-PETROLATUM (XEROFORM PETROLAT GAUZE 1"X8" EX)    Apply topically. Reported on 03/23/2016   MUPIROCIN CREAM (BACTROBAN) 2 %  Apply 1 application topically 2 (two) times daily. Reported on 03/23/2016     SIGNIFICANT DIAGNOSTIC EXAMS  10-12-15: chest x-ray: Enlarged cardiac silhouette. Pulmonary vascular congestion.  10-18-15: EEG: This is a normal EEG for the patients stated age.  There were no focal, hemispheric or lateralizing features.  No epileptiform activity was recorded.  A normal EEG does not exclude the diagnosis of a seizure disorder and if seizure remains high on the list of differential diagnosis, an ambulatory EEG may be of value.  Clinical correlation is required.  01-11-16: chest x-ray: Minimal left base atelectasis.  Otherwise, normal exam.  01-11-16: ct of head: No acute intracranial abnormality. Stable mild cerebral atrophy and chronic white matter disease. No definite acute cortical infarction.  01-11-16: scrotal ultrasound: 1. Left epididymo-orchitis. 2. Microlithiasis involving the left testicle. 3. Three adjacent complex cysts/spermatoceles involving the left epididymal head.   LABS REVIEWED:   09-10-15: hgb a1c 10.4 09-15-15; tsh 2.954 10-12-15; wbc 5.9; hgb 11.8; hct 36.1; mcv 93.5; pt 195; glucose 187; bun 8; creat 0.78; k+ 5.0; na++136 11-03-15: wbc 9.2; hgb 12.7; hct 39.7; mcv 89.3; plt 267; glucose 105; bun 17.2; creat 0.61; k+ 4.2; na++135  12-29-15: chol 180; ldl 115; trig 66; hdl 52; hgb a1c 8.8  01-11-16: wbc 19.6; hgb 13.6; hct 40.1; mcv 87.6; plt 232; glucose 255; bun 15; creat 0.67; k+ 4.7; na++132; liver normal albumin 2.9; blood culture: no growth; urine culture: e-coli-esbl; klebsiella pneumoniae  01-15-16: wbc 7.4; hgb 11.6; hct 35.7; mcv 89.9; plt 242; glucose 159; bun 5; creat 0.53; k+ 4.1; na++135       Review of Systems  Constitutional: Negative for malaise/fatigue.  Respiratory: Negative for cough and shortness of breath.   Cardiovascular:  Positive for leg swelling. Negative for chest pain and palpitations.  Gastrointestinal: Negative for heartburn, abdominal pain and constipation.  Musculoskeletal: Negative for myalgias, back pain and joint pain.  Skin: Negative.   Neurological: Negative for dizziness.  Psychiatric/Behavioral: The patient is not nervous/anxious.        Physical Exam  Constitutional: He is oriented to person, place, and time. No distress.  Obese   Eyes: Conjunctivae are normal.  Neck: Neck supple. No JVD present. No thyromegaly present.  Cardiovascular: Normal rate, regular rhythm and intact distal pulses.   Respiratory: Effort normal and breath sounds normal. No respiratory distress. He has no wheezes.  GI: Soft. Bowel sounds are normal. He exhibits no distension. There is no tenderness.  Genitourinary:  Has foley   Musculoskeletal: He exhibits edema.  Able to move all extremities Trace bilateral lower extremity edema     Lymphadenopathy:    He has no cervical adenopathy.  Neurological: alert  Skin: Skin is warm and dry. He is not diaphoretic. He does have excessively dry skin.          ASSESSMENT/ PLAN:  1. Diabetes: hgb a1c 8.8 is improved form 10.4  Will increase nph to 35 units twice daily and will begin tradjenta 5 mg daily; will check cbg twice daily  Will check hgb a1c and urine for micro-albumin      Ok Edwards NP [facility]  Contact [phone] Monday through Friday 8am- 5pm  After hours call [phone]

## 2016-03-26 DIAGNOSIS — E08311 Diabetes mellitus due to underlying condition with unspecified diabetic retinopathy with macular edema: Secondary | ICD-10-CM | POA: Diagnosis not present

## 2016-03-26 DIAGNOSIS — Z79899 Other long term (current) drug therapy: Secondary | ICD-10-CM | POA: Diagnosis not present

## 2016-03-26 LAB — MICROALBUMIN, URINE: MICROALB UR: 28.5

## 2016-03-26 LAB — HEMOGLOBIN A1C: Hemoglobin A1C: 8.7

## 2016-03-30 ENCOUNTER — Encounter: Payer: Self-pay | Admitting: Adult Health

## 2016-03-30 ENCOUNTER — Non-Acute Institutional Stay (SKILLED_NURSING_FACILITY): Payer: Medicare Other | Admitting: Adult Health

## 2016-03-30 DIAGNOSIS — E1165 Type 2 diabetes mellitus with hyperglycemia: Secondary | ICD-10-CM | POA: Diagnosis not present

## 2016-03-30 DIAGNOSIS — E1143 Type 2 diabetes mellitus with diabetic autonomic (poly)neuropathy: Secondary | ICD-10-CM

## 2016-03-30 DIAGNOSIS — IMO0002 Reserved for concepts with insufficient information to code with codable children: Secondary | ICD-10-CM

## 2016-03-30 NOTE — Progress Notes (Signed)
Patient ID: [name], male   DOB: 17-Nov-1945, 70 y.o.   MRN: [id number]   Location:   [place] Room Number: 216-B Place of Service:  SNF (31)   CODE STATUS: DNR  Allergies  Allergen Reactions  . Ace Inhibitors Swelling    Pt tolerates lisinopril  . Lipitor [Atorvastatin Calcium] Swelling  . Metformin And Related Swelling  . Cefadroxil Hives  . Cephalexin Hives  . Morphine And Related Other (See Comments)    Sweating, feels like is "in rocky boat."  . Robaxin [Methocarbamol] Other (See Comments)    Feels like he is shaky    Chief Complaint  Patient presents with  . Acute Visit    Diabetes    HPI:  His hgb a1c is 8.7; this is slowly improving; but is not yet at goal of <8. He does not want change from NPH insulin. He is not voicing any complaints or concerns at this time. There are no nursing concerns at this time.    Past Medical History  Diagnosis Date  . Obese   . Hypertension   . Diabetic neuropathy, painful ([place])   . Chronic indwelling Foley catheter   . Bladder spasms   . Hyperlipidemia   . GERD (gastroesophageal reflux disease)   . Neurogenic bladder   . CAD (coronary artery disease)   . PONV (postoperative nausea and vomiting)   . CHF (congestive heart failure) ([place])   . DVT (deep venous thrombosis) ([facility]) 1980's    LLE  . Type II diabetes mellitus ([place])   . Diabetic diarrhea ([place])   . Pneumonia 1999  . OSA (obstructive sleep apnea)     "they wanted me to wear a mask; I couldn't" (11/10/2014)  . Arthritis     "hands, ankles, knees" (11/10/2014)  . Chronic lower back pain   . Stroke [facility]) 10/2011; 06/2014    right hand numbness/notes 10/20/2011, pt not sure he had stroke in 2013; "right hand weaker and right face not quite right since" (11/10/2014)  . Depression   . CAD in native artery 09/15/2015    Past Surgical History  Procedure Laterality Date  . Gastroplasty    . Total knee arthroplasty Right 1990's  . Left heart catheterization  with coronary angiogram N/A 10/24/2011    Procedure: LEFT HEART CATHETERIZATION WITH CORONARY ANGIOGRAM;  Surgeon: Candee Furbish, MD;  Location: [facility] CATH LAB;  Service: Cardiovascular;  Laterality: N/A;  right radial artery approach  . Cholecystectomy open  1970's?  . Joint replacement    . Carpal tunnel release Bilateral ~ 2003-2004  . Cardiac catheterization N/A 05/02/2015    Procedure: Left Heart Cath and Coronary Angiography;  Surgeon: Lorretta Harp, MD;  Location: [place] CV LAB;  Service: Cardiovascular;  Laterality: N/A;  . Cardiac catheterization N/A 05/04/2015    Procedure: Left Heart Cath and Coronary Angiography;  Surgeon: Wellington Hampshire, MD;  Location: [place] CV LAB;  Service: Cardiovascular;  Laterality: N/A;    Social History   Social History  . Marital Status: Widowed    Spouse Name: N/A  . Number of Children: N/A  . Years of Education: N/A   Occupational History  . Retired    Social History Main Topics  . Smoking status: Former Smoker -- 2.00 packs/day for 20 years    Types: Cigarettes    Quit date: 09/10/1976  . Smokeless tobacco: Never Used  . Alcohol Use: No     Comment: FORMER ALCOHOLIC; "sober since  08/08/1982"  . Drug Use: No  . Sexual Activity: No   Other Topics Concern  . Not on file   Social History Narrative   Lives alone.  No close family nearby.     Family History  Problem Relation Age of Onset  . Hypertension Mother   . Diabetes type I Father   . Cancer Brother      Testicular Cancer  . Cancer Brother     Leukemia      VITAL SIGNS BP 122/67 mmHg  Pulse 83  Temp(Src) 98.4 F (36.9 C) (Oral)  Resp 18  Ht 6\' 1"  (1.854 m)  Wt 344 lb 6 oz (156.207 kg)  BMI 45.44 kg/m2  SpO2 98%  Patient's Medications  New Prescriptions   No medications on file  Previous Medications   ALUMINUM-MAGNESIUM HYDROXIDE-SIMETHICONE (MAALOX) I037812 MG/5ML SUSP    Take 30 mLs by mouth every 6 (six) hours as needed.   AMINO ACIDS-PROTEIN  HYDROLYS (FEEDING SUPPLEMENT, PRO-STAT SUGAR FREE 64,) LIQD    Take 30 mLs by mouth 3 (three) times daily with meals. Give 0900, 1500, 2100   AMLODIPINE (NORVASC) 5 MG TABLET    Take 5 mg by mouth daily.   ASPIRIN EC 81 MG TABLET    Take 81 mg by mouth daily. 0900   ESCITALOPRAM (LEXAPRO) 10 MG TABLET    Take 10 mg by mouth daily. 1000   FUROSEMIDE (LASIX) 40 MG TABLET    Take 40 mg by mouth 2 (two) times daily. 0800 , 2100   GABAPENTIN (NEURONTIN) 100 MG CAPSULE    Take 1 capsule (100 mg total) by mouth 2 (two) times daily.   HYDROXYZINE (ATARAX/VISTARIL) 25 MG TABLET    Take 25 mg by mouth 4 (four) times daily. 1000 1800   INSULIN NPH HUMAN (HUMULIN N,NOVOLIN N) 100 UNIT/ML INJECTION    Inject 35 Units into the skin 2 (two) times daily. 800 am and 10pm   LINAGLIPTIN (TRADJENTA) 5 MG TABS TABLET    Take 5 mg by mouth daily.   MAGNESIUM OXIDE (MAG-OX) 400 MG TABLET    Take 400 mg by mouth daily. 0900   METOPROLOL TARTRATE (LOPRESSOR) 25 MG TABLET    0.5 tablet by mouth two times a day 0900 , 2100   NITROGLYCERIN (NITROSTAT) 0.4 MG SL TABLET    Place 0.4 mg under the tongue every 5 (five) minutes as needed for chest pain.   OMEPRAZOLE (PRILOSEC) 40 MG CAPSULE    Take 40 mg by mouth daily. 0600   POTASSIUM CHLORIDE (KLOR-CON) 20 MEQ PACKET    Take 40 mEq by mouth daily. 0800   ROPINIROLE (REQUIP) 0.5 MG TABLET    Take 0.5 mg by mouth at bedtime. 2100   SENNOSIDES-DOCUSATE SODIUM (SENOKOT-S) 8.6-50 MG TABLET    Take 2 tablets by mouth at bedtime. 2100   TRAMADOL-ACETAMINOPHEN (ULTRACET) 37.5-325 MG TABLET    Take 2 tablets by mouth every 6 (six) hours as needed for moderate pain.   TRIAMCINOLONE CREAM (KENALOG) 0.1 %    Apply 1 application topically 2 (two) times daily.   VITAMIN C (ASCORBIC ACID) 500 MG TABLET    Take 500 mg by mouth 2 (two) times daily. 0900 and 2100   ZINC SULFATE 220 (50 ZN) MG CAPSULE    Take 220 mg by mouth daily.  Modified Medications   No medications on file  Discontinued  Medications   No medications on file     SIGNIFICANT DIAGNOSTIC EXAMS  10-12-15: chest x-ray: Enlarged cardiac silhouette. Pulmonary vascular congestion.  10-18-15: EEG: This is a normal EEG for the patients stated age.  There were no focal, hemispheric or lateralizing features.  No epileptiform activity was recorded.  A normal EEG does not exclude the diagnosis of a seizure disorder and if seizure remains high on the list of differential diagnosis, an ambulatory EEG may be of value.  Clinical correlation is required.  01-11-16: chest x-ray: Minimal left base atelectasis.  Otherwise, normal exam.  01-11-16: ct of head: No acute intracranial abnormality. Stable mild cerebral atrophy and chronic white matter disease. No definite acute cortical infarction.  01-11-16: scrotal ultrasound: 1. Left epididymo-orchitis. 2. Microlithiasis involving the left testicle. 3. Three adjacent complex cysts/spermatoceles involving the left epididymal head.   LABS REVIEWED:   09-10-15: hgb a1c 10.4 09-15-15; tsh 2.954 10-12-15; wbc 5.9; hgb 11.8; hct 36.1; mcv 93.5; pt 195; glucose 187; bun 8; creat 0.78; k+ 5.0; na++136 11-03-15: wbc 9.2; hgb 12.7; hct 39.7; mcv 89.3; plt 267; glucose 105; bun 17.2; creat 0.61; k+ 4.2; na++135  12-29-15: chol 180; ldl 115; trig 66; hdl 52; hgb a1c 8.8  01-11-16: wbc 19.6; hgb 13.6; hct 40.1; mcv 87.6; plt 232; glucose 255; bun 15; creat 0.67; k+ 4.7; na++132; liver normal albumin 2.9; blood culture: no growth; urine culture: e-coli-esbl; klebsiella pneumoniae  01-15-16: wbc 7.4; hgb 11.6; hct 35.7; mcv 89.9; plt 242; glucose 159; bun 5; creat 0.53; k+ 4.1; na++135  03-26-16: hgb a1c 8.7; urine micro-albumin  28.5       Review of Systems  Constitutional: Negative for malaise/fatigue.  Respiratory: Negative for cough and shortness of breath.   Cardiovascular: Positive for leg swelling. Negative for chest pain and palpitations.  Gastrointestinal: Negative for heartburn, abdominal pain  and constipation.  Musculoskeletal: Negative for myalgias, back pain and joint pain.  Skin: Negative.   Neurological: Negative for dizziness.  Psychiatric/Behavioral: The patient is not nervous/anxious.        Physical Exam  Constitutional: He is oriented to person, place, and time. No distress.  Obese   Eyes: Conjunctivae are normal.  Neck: Neck supple. No JVD present. No thyromegaly present.  Cardiovascular: Normal rate, regular rhythm and intact distal pulses.   Respiratory: Effort normal and breath sounds normal. No respiratory distress. He has no wheezes.  GI: Soft. Bowel sounds are normal. He exhibits no distension. There is no tenderness.  Genitourinary:  Has foley   Musculoskeletal: He exhibits edema.  Able to move all extremities Trace bilateral lower extremity edema     Lymphadenopathy:    He has no cervical adenopathy.  Neurological: alert  Skin: Skin is warm and dry. He is not diaphoretic. He does have excessively dry skin.          ASSESSMENT/ PLAN:  1. Diabetes: hgb a1c is 8.7; with urine for micro-albumin 28.5; will increase his nph to 40 units twice daily will continue tradjenta 5 mg daily will monitor     Ok Edwards NP [facility]  Contact [phone] Monday through Friday 8am- 5pm  After hours call [phone]

## 2016-04-04 ENCOUNTER — Encounter: Payer: Self-pay | Admitting: Adult Health

## 2016-04-04 ENCOUNTER — Non-Acute Institutional Stay (SKILLED_NURSING_FACILITY): Payer: Medicare Other | Admitting: Adult Health

## 2016-04-04 DIAGNOSIS — L03116 Cellulitis of left lower limb: Secondary | ICD-10-CM | POA: Diagnosis not present

## 2016-04-04 NOTE — Progress Notes (Signed)
Patient ID: [name], male   DOB: 12-03-1945, 70 y.o.   MRN: [id number]   Location:   [facility] Room Number: 216-B Place of Service:  SNF (31)   CODE STATUS: Full Code  Allergies  Allergen Reactions  . Ace Inhibitors Swelling    Pt tolerates lisinopril  . Lipitor [Atorvastatin Calcium] Swelling  . Metformin And Related Swelling  . Cefadroxil Hives  . Cephalexin Hives  . Morphine And Related Other (See Comments)    Sweating, feels like is "in rocky boat."  . Robaxin [Methocarbamol] Other (See Comments)    Feels like he is shaky    Chief Complaint  Patient presents with  . Acute Visit    HPI:  I have been asked to see him regarding his left foot. The left foot is inflamed with blisters present. There are no reports of fever present. He is not complaining of pain.   Past Medical History:  Diagnosis Date  . Arthritis    "hands, ankles, knees" (11/10/2014)  . Bladder spasms   . CAD (coronary artery disease)   . CAD in native artery 09/15/2015  . CHF (congestive heart failure) ([place])   . Chronic indwelling Foley catheter   . Chronic lower back pain   . Depression   . Diabetic diarrhea ([place])   . Diabetic neuropathy, painful ([place])   . DVT (deep venous thrombosis) (H[facility]) 1980's   LLE  . GERD (gastroesophageal reflux disease)   . Hyperlipidemia   . Hypertension   . Neurogenic bladder   . Obese   . OSA (obstructive sleep apnea)    "they wanted me to wear a mask; I couldn't" (11/10/2014)  . Pneumonia 1999  . PONV (postoperative nausea and vomiting)   . Stroke [facility]) 10/2011; 06/2014   right hand numbness/notes 10/20/2011, pt not sure he had stroke in 2013; "right hand weaker and right face not quite right since" (11/10/2014)  . Type II diabetes mellitus ([place])     Past Surgical History:  Procedure Laterality Date  . CARDIAC CATHETERIZATION N/A 05/02/2015   Procedure: Left Heart Cath and Coronary Angiography;  Surgeon: Lorretta Harp, MD;  Location: [place]  CV LAB;  Service: Cardiovascular;  Laterality: N/A;  . CARDIAC CATHETERIZATION N/A 05/04/2015   Procedure: Left Heart Cath and Coronary Angiography;  Surgeon: Wellington Hampshire, MD;  Location: [place] CV LAB;  Service: Cardiovascular;  Laterality: N/A;  . CARPAL TUNNEL RELEASE Bilateral ~ 2003-2004  . CHOLECYSTECTOMY OPEN  1970's?  Marland Kitchen GASTROPLASTY    . JOINT REPLACEMENT    . LEFT HEART CATHETERIZATION WITH CORONARY ANGIOGRAM N/A 10/24/2011   Procedure: LEFT HEART CATHETERIZATION WITH CORONARY ANGIOGRAM;  Surgeon: Candee Furbish, MD;  Location: [facility] CATH LAB;  Service: Cardiovascular;  Laterality: N/A;  right radial artery approach  . TOTAL KNEE ARTHROPLASTY Right 1990's    Social History   Social History  . Marital status: Widowed    Spouse name: N/A  . Number of children: N/A  . Years of education: N/A   Occupational History  . Retired    Social History Main Topics  . Smoking status: Former Smoker    Packs/day: 2.00    Years: 20.00    Types: Cigarettes    Quit date: 09/10/1976  . Smokeless tobacco: Never Used  . Alcohol use No     Comment: FORMER ALCOHOLIC; "sober since [id number]"  . Drug use: No  . Sexual activity: No   Other Topics Concern  . Not  on file   Social History Narrative   Lives alone.  No close family nearby.     Family History  Problem Relation Age of Onset  . Diabetes type I Father   . Hypertension Mother   . Cancer Brother      Testicular Cancer  . Cancer Brother     Leukemia      VITAL SIGNS BP 132/68   Pulse 76   Temp 97.5 F (36.4 C) (Oral)   Resp 20   Ht 6\' 1"  (1.854 m)   Wt (!) 344 lb 6 oz (156.2 kg)   SpO2 98%   BMI 45.43 kg/m   Patient's Medications  New Prescriptions   No medications on file  Previous Medications   ALUMINUM-MAGNESIUM HYDROXIDE-SIMETHICONE (MAALOX) [id number] MG/5ML SUSP    Take 30 mLs by mouth every 6 (six) hours as needed.   AMINO ACIDS-PROTEIN HYDROLYS (FEEDING SUPPLEMENT, PRO-STAT SUGAR FREE 64,) LIQD    Take  30 mLs by mouth 3 (three) times daily with meals. Give 0900, 1500, 2100   AMLODIPINE (NORVASC) 5 MG TABLET    Take 5 mg by mouth daily.   ASPIRIN EC 81 MG TABLET    Take 81 mg by mouth daily. 0900   ESCITALOPRAM (LEXAPRO) 10 MG TABLET    Take 10 mg by mouth daily. 1000   FUROSEMIDE (LASIX) 40 MG TABLET    Take 40 mg by mouth 2 (two) times daily. 0800 , 2100   GABAPENTIN (NEURONTIN) 100 MG CAPSULE    Take 1 capsule (100 mg total) by mouth 2 (two) times daily.   HYDROXYZINE (ATARAX/VISTARIL) 25 MG TABLET    Take 25 mg by mouth 4 (four) times daily. 1000 1800   INSULIN NPH HUMAN (HUMULIN N,NOVOLIN N) 100 UNIT/ML INJECTION    Inject 35 Units into the skin 2 (two) times daily. 800 am and 10pm   LINAGLIPTIN (TRADJENTA) 5 MG TABS TABLET    Take 5 mg by mouth daily.   MAGNESIUM OXIDE (MAG-OX) 400 MG TABLET    Take 400 mg by mouth daily. 0900   METOPROLOL TARTRATE (LOPRESSOR) 25 MG TABLET    0.5 tablet by mouth two times a day 0900 , 2100   NITROGLYCERIN (NITROSTAT) 0.4 MG SL TABLET    Place 0.4 mg under the tongue every 5 (five) minutes as needed for chest pain.   OMEPRAZOLE (PRILOSEC) 40 MG CAPSULE    Take 40 mg by mouth daily. 0600   POTASSIUM CHLORIDE (KLOR-CON) 20 MEQ PACKET    Take 40 mEq by mouth daily. 0800   ROPINIROLE (REQUIP) 0.5 MG TABLET    Take 0.5 mg by mouth at bedtime. 2100   SENNOSIDES-DOCUSATE SODIUM (SENOKOT-S) 8.6-50 MG TABLET    Take 2 tablets by mouth at bedtime. 2100   TRAMADOL-ACETAMINOPHEN (ULTRACET) 37.5-325 MG TABLET    Take 2 tablets by mouth every 6 (six) hours as needed for moderate pain.   TRIAMCINOLONE CREAM (KENALOG) 0.1 %    Apply 1 application topically 2 (two) times daily.   VITAMIN C (ASCORBIC ACID) 500 MG TABLET    Take 500 mg by mouth 2 (two) times daily. 0900 and 2100   ZINC SULFATE 220 (50 ZN) MG CAPSULE    Take 220 mg by mouth daily.  Modified Medications   No medications on file  Discontinued Medications   No medications on file     SIGNIFICANT DIAGNOSTIC  EXAMS  10-12-15: chest x-ray: Enlarged cardiac silhouette. Pulmonary vascular congestion.  10-18-15:  EEG: This is a normal EEG for the patients stated age.  There were no focal, hemispheric or lateralizing features.  No epileptiform activity was recorded.  A normal EEG does not exclude the diagnosis of a seizure disorder and if seizure remains high on the list of differential diagnosis, an ambulatory EEG may be of value.  Clinical correlation is required.  01-11-16: chest x-ray: Minimal left base atelectasis.  Otherwise, normal exam.  01-11-16: ct of head: No acute intracranial abnormality. Stable mild cerebral atrophy and chronic white matter disease. No definite acute cortical infarction.  01-11-16: scrotal ultrasound: 1. Left epididymo-orchitis. 2. Microlithiasis involving the left testicle. 3. Three adjacent complex cysts/spermatoceles involving the left epididymal head.   LABS REVIEWED:   09-10-15: hgb a1c 10.4 09-15-15; tsh 2.954 10-12-15; wbc 5.9; hgb 11.8; hct 36.1; mcv 93.5; pt 195; glucose 187; bun 8; creat 0.78; k+ 5.0; na++136 11-03-15: wbc 9.2; hgb 12.7; hct 39.7; mcv 89.3; plt 267; glucose 105; bun 17.2; creat 0.61; k+ 4.2; na++135  12-29-15: chol 180; ldl 115; trig 66; hdl 52; hgb a1c 8.8  01-11-16: wbc 19.6; hgb 13.6; hct 40.1; mcv 87.6; plt 232; glucose 255; bun 15; creat 0.67; k+ 4.7; na++132; liver normal albumin 2.9; blood culture: no growth; urine culture: e-coli-esbl; klebsiella pneumoniae  01-15-16: wbc 7.4; hgb 11.6; hct 35.7; mcv 89.9; plt 242; glucose 159; bun 5; creat 0.53; k+ 4.1; na++135       Review of Systems  Constitutional: Negative for malaise/fatigue.  Respiratory: Negative for cough and shortness of breath.   Cardiovascular: Positive for leg swelling. Negative for chest pain and palpitations.  Gastrointestinal: Negative for heartburn, abdominal pain and constipation.  Musculoskeletal: Negative for myalgias, back pain and joint pain.  Skin: Negative.   Neurological:  Negative for dizziness.  Psychiatric/Behavioral: The patient is not nervous/anxious.        Physical Exam  Constitutional: He is oriented to person, place, and time. No distress.  Obese   Eyes: Conjunctivae are normal.  Neck: Neck supple. No JVD present. No thyromegaly present.  Cardiovascular: Normal rate, regular rhythm and intact distal pulses.   Respiratory: Effort normal and breath sounds normal. No respiratory distress. He has no wheezes.  GI: Soft. Bowel sounds are normal. He exhibits no distension. There is no tenderness.  Genitourinary:  Has foley   Musculoskeletal: He exhibits edema.  Able to move all extremities Trace bilateral lower extremity edema     Lymphadenopathy:    He has no cervical adenopathy.  Neurological: alert  Skin: Skin is warm and dry. He is not diaphoretic. He does have excessively dry skin Left foot is inflamed; with blisters present .          ASSESSMENT/ PLAN:  Left foot cellulitis: will extend doxycycline for a total of 3 weeks. Will begin probiotic for 3 weeks.   Ok Edwards NP [facility]  Contact [phone] Monday through Friday 8am- 5pm  After hours call [phone]

## 2016-04-05 ENCOUNTER — Non-Acute Institutional Stay (SKILLED_NURSING_FACILITY): Payer: Medicare Other | Admitting: Internal Medicine

## 2016-04-05 ENCOUNTER — Encounter: Payer: Self-pay | Admitting: Internal Medicine

## 2016-04-05 DIAGNOSIS — I5032 Chronic diastolic (congestive) heart failure: Secondary | ICD-10-CM

## 2016-04-05 DIAGNOSIS — I110 Hypertensive heart disease with heart failure: Secondary | ICD-10-CM

## 2016-04-05 DIAGNOSIS — R21 Rash and other nonspecific skin eruption: Secondary | ICD-10-CM

## 2016-04-05 DIAGNOSIS — K219 Gastro-esophageal reflux disease without esophagitis: Secondary | ICD-10-CM | POA: Diagnosis not present

## 2016-04-05 NOTE — Progress Notes (Signed)
MRN: [id number] Name: [name]  Sex: male Age: 70 y.o. DOB: 1946/08/31  [place] #:  Facility/Room: [facility] / 216B Level Of Care: SNF Provider: Noah Delaine. Sheppard Coil, MD Emergency Contacts: Extended Emergency Contact Information Primary Emergency Contact: Whiting,[name] Address: [address]          [place], [place] [address] [name] of [place] Phone: [phone] Relation: Friend Secondary Emergency Contact: [name] [place] Phone: [phone] Relation: None  Code Status: Full Code  Allergies: Ace inhibitors; Lipitor [atorvastatin calcium]; Metformin and related; Cefadroxil; Cephalexin; Morphine and related; and Robaxin [methocarbamol]  Chief Complaint  Patient presents with  . Medical Management of Chronic Issues    Routine Visit    HPI: Patient is 70 y.o. male who is beng seen for routine issues of HTN, CHF, and GERD, along with f/u on UE rash.  Past Medical History:  Diagnosis Date  . Arthritis    "hands, ankles, knees" (11/10/2014)  . Bladder spasms   . CAD (coronary artery disease)   . CAD in native artery 09/15/2015  . CHF (congestive heart failure) ([place])   . Chronic indwelling Foley catheter   . Chronic lower back pain   . Depression   . Diabetic diarrhea ([place])   . Diabetic neuropathy, painful ([place])   . DVT (deep venous thrombosis) (HCC) 1980's   LLE  . GERD (gastroesophageal reflux disease)   . Hyperlipidemia   . Hypertension   . Neurogenic bladder   . Obese   . OSA (obstructive sleep apnea)    "they wanted me to wear a mask; I couldn't" (11/10/2014)  . Pneumonia 1999  . PONV (postoperative nausea and vomiting)   . Stroke [facility]) 10/2011; 06/2014   right hand numbness/notes 10/20/2011, pt not sure he had stroke in 2013; "right hand weaker and right face not quite right since" (11/10/2014)  . Type II diabetes mellitus ([place])     Past Surgical History:  Procedure Laterality Date  . CARDIAC CATHETERIZATION N/A 05/02/2015    Procedure: Left Heart Cath and Coronary Angiography;  Surgeon: Lorretta Harp, MD;  Location: [place] CV LAB;  Service: Cardiovascular;  Laterality: N/A;  . CARDIAC CATHETERIZATION N/A 05/04/2015   Procedure: Left Heart Cath and Coronary Angiography;  Surgeon: Wellington Hampshire, MD;  Location: [place] CV LAB;  Service: Cardiovascular;  Laterality: N/A;  . CARPAL TUNNEL RELEASE Bilateral ~ 2003-2004  . CHOLECYSTECTOMY OPEN  1970's?  Marland Kitchen GASTROPLASTY    . JOINT REPLACEMENT    . LEFT HEART CATHETERIZATION WITH CORONARY ANGIOGRAM N/A 10/24/2011   Procedure: LEFT HEART CATHETERIZATION WITH CORONARY ANGIOGRAM;  Surgeon: Candee Furbish, MD;  Location: [facility] CATH LAB;  Service: Cardiovascular;  Laterality: N/A;  right radial artery approach  . TOTAL KNEE ARTHROPLASTY Right 1990's      Medication List       Accurate as of 04/05/16 12:33 PM. Always use your most recent med list.          aluminum-magnesium hydroxide-simethicone [id number] MG/5ML Susp Commonly known as:  MAALOX Take 30 mLs by mouth every 6 (six) hours as needed.   aspirin EC 81 MG tablet Take 81 mg by mouth daily. 0900   escitalopram 10 MG tablet Commonly known as:  LEXAPRO Take 10 mg by mouth daily. 1000   feeding supplement (PRO-STAT SUGAR FREE 64) Liqd Take 30 mLs by mouth 3 (three) times daily with meals. Give 0900, 1500, 2100   furosemide 40 MG tablet Commonly known as:  LASIX Take 40 mg by mouth 2 (two) times daily. 0800 , 2100   gabapentin 100 MG capsule Commonly known as:  NEURONTIN Take 1 capsule (100 mg total) by mouth 2 (two) times daily.   hydrOXYzine 25 MG tablet Commonly known as:  ATARAX/VISTARIL Take 25 mg by mouth 4 (four) times daily. 1000 1800   insulin NPH Human 100 UNIT/ML injection Commonly known as:  HUMULIN N,NOVOLIN N Inject 35 Units into the skin 2 (two) times daily. 800 am and 10pm   linagliptin 5 MG Tabs tablet Commonly known as:  TRADJENTA Take 5 mg by mouth daily.   magnesium  oxide 400 MG tablet Commonly known as:  MAG-OX Take 400 mg by mouth daily. 0900   metoprolol tartrate 25 MG tablet Commonly known as:  LOPRESSOR 0.5 tablet by mouth two times a day 0900 , 2100   nitroGLYCERIN 0.4 MG SL tablet Commonly known as:  NITROSTAT Place 0.4 mg under the tongue every 5 (five) minutes as needed for chest pain.   NORVASC 5 MG tablet Generic drug:  amLODipine Take 5 mg by mouth daily.   omeprazole 40 MG capsule Commonly known as:  PRILOSEC Take 40 mg by mouth daily. 0600   potassium chloride 20 MEQ packet Commonly known as:  KLOR-CON Take 40 mEq by mouth daily. 0800   rOPINIRole 0.5 MG tablet Commonly known as:  REQUIP Take 0.5 mg by mouth at bedtime. 2100   sennosides-docusate sodium 8.6-50 MG tablet Commonly known as:  SENOKOT-S Take 2 tablets by mouth at bedtime. 2100   traMADol-acetaminophen 37.5-325 MG tablet Commonly known as:  ULTRACET Take 2 tablets by mouth every 6 (six) hours as needed for moderate pain.   triamcinolone cream 0.1 % Commonly known as:  KENALOG Apply 1 application topically 2 (two) times daily.   vitamin C 500 MG tablet Commonly known as:  ASCORBIC ACID Take 500 mg by mouth 2 (two) times daily. 0900 and 2100   zinc sulfate 220 (50 Zn) MG capsule Take 220 mg by mouth daily.       No orders of the defined types were placed in this encounter.   Immunization History  Administered Date(s) Administered  . Influenza Split 10/12/2011, 10/13/2011  . Influenza,inj,Quad PF,36+ Mos 05/06/2015  . Influenza-Unspecified 06/10/2014  . PPD Test 10/13/2015    Social History  Substance Use Topics  . Smoking status: Former Smoker    Packs/day: 2.00    Years: 20.00    Types: Cigarettes    Quit date: 09/10/1976  . Smokeless tobacco: Never Used  . Alcohol use No     Comment: FORMER ALCOHOLIC; "sober since [id number]"    Review of Systems  DATA OBTAINED: from patient GENERAL:  no fevers, fatigue, appetite changes SKIN:   Itching better, rash may be better HEENT: No complaint RESPIRATORY: No cough, wheezing, SOB CARDIAC: No chest pain, palpitations, lower extremity edema  GI: No abdominal pain, No N/V/D or constipation, No heartburn or reflux  GU: No dysuria, frequency or urgency, or incontinence  MUSCULOSKELETAL: No unrelieved bone/joint pain NEUROLOGIC: No headache, dizziness  PSYCHIATRIC: No overt anxiety or sadness  Vitals:   04/05/16 1221  BP: (!) 123/94  Pulse: 90  Resp: 18  Temp: 97.5 F (36.4 C)    Physical Exam  GENERAL APPEARANCE: Alert, conversant, No acute distress  SKIN: rash upper ext is about the same ;excoriations are much better HEENT: Unremarkable RESPIRATORY: Breathing is even, unlabored. Lung sounds are clear   CARDIOVASCULAR: Heart RRR  no murmurs, rubs or gallops. No peripheral edema  GASTROINTESTINAL: Abdomen is soft, non-tender, not distended w/ normal bowel sounds.  GENITOURINARY: Bladder non tender, not distended  MUSCULOSKELETAL: No abnormal joints or musculature NEUROLOGIC: Cranial nerves 2-12 grossly intact. Moves all extremities PSYCHIATRIC: Mood and affect appropriate to situation, no behavioral issues  Patient Active Problem List   Diagnosis Date Noted  . Infection due to ESBL-producing Escherichia coli 01/22/2016  . Klebsiella cystitis 01/22/2016  . H/O: CVA (cerebrovascular accident) 01/22/2016  . Altered mental status   . Acute encephalopathy 01/11/2016  . Sepsis ([place]) 01/11/2016  . UTI (lower urinary tract infection) 01/11/2016  . Epididymo-orchitis 01/11/2016  . Depression   . Rash and nonspecific skin eruption 11/07/2015  . GERD (gastroesophageal reflux disease) 10/22/2015  . Restless legs 10/22/2015  . Chronic venous stasis dermatitis of both lower extremities 10/08/2015  . CAD in native artery 09/15/2015  . PVC's (premature ventricular contractions) 09/15/2015  . Sinus pause 09/14/2015  . Morbid obesity ([place]) 07/03/2015  . Type II diabetes  mellitus with peripheral circulatory disorder, uncontrolled ([place]) 05/09/2015  . Hyperlipidemia LDL goal <70 05/02/2015  . Abnormal nuclear stress test 05/02/2015  . Neurogenic bladder 04/26/2015  . Pressure ulcer 04/15/2015  . UTI (urinary tract infection) due to urinary indwelling catheter ([place]) 04/14/2015  . History of DVT (deep vein thrombosis) 12/08/2014  . Chronic diastolic CHF (congestive heart failure) ([place]) 04/01/2014  . Chronic indwelling Foley catheter 12/23/2013  . CVA (cerebral infarction) 03/11/2012  . NSVT (nonsustained ventricular tachycardia) ([place]) 10/26/2011  . DM (diabetes mellitus) with complications ([place]) [id number]  . Diabetic neuropathy ([place]) 10/10/2011  . OSA (obstructive sleep apnea) 10/10/2011    CBC    Component Value Date/Time   WBC 7.4 01/15/2016 0513   RBC 3.97 (L) 01/15/2016 0513   HGB 11.6 (L) 01/15/2016 0513   HCT 35.7 (L) 01/15/2016 0513   PLT 242 01/15/2016 0513   MCV 89.9 01/15/2016 0513   LYMPHSABS 0.8 01/11/2016 1442   MONOABS 1.5 (H) 01/11/2016 1442   EOSABS 0.5 01/11/2016 1442   BASOSABS 0.1 01/11/2016 1442    CMP     Component Value Date/Time   NA 135 01/15/2016 0513   NA 135 (A) 11/15/2015   K 4.1 01/15/2016 0513   CL 99 (L) 01/15/2016 0513   CO2 23 01/15/2016 0513   GLUCOSE 159 (H) 01/15/2016 0513   BUN 15 01/15/2016 0513   BUN 17 11/15/2015   CREATININE 0.53 (L) 01/15/2016 0513   CREATININE 0.81 09/20/2014 1537   CALCIUM 8.2 (L) 01/15/2016 0513   PROT 6.4 (L) 01/11/2016 1442   ALBUMIN 2.9 (L) 01/11/2016 1442   AST 17 01/11/2016 1442   ALT 13 (L) 01/11/2016 1442   ALKPHOS 102 01/11/2016 1442   BILITOT 0.6 01/11/2016 1442   GFRNONAA >60 01/15/2016 0513   GFRAA >60 01/15/2016 0513    Assessment and Plan.   Jerrye Bushy: will continue prilosec 40 mg daily    Diastolic heart failure: EF is 55-60%;  will continue lasix 40 mg twice daily with k+ 40 meq daily    Hypertension: will continue lopressor 12.5 mg twice daily   And  will begin noravsc 5 mg daily and will check blood pressure twice daily     [place] Jenifer D. Sheppard Coil, MD

## 2016-04-06 DIAGNOSIS — L97419 Non-pressure chronic ulcer of right heel and midfoot with unspecified severity: Secondary | ICD-10-CM | POA: Diagnosis not present

## 2016-04-09 ENCOUNTER — Non-Acute Institutional Stay (SKILLED_NURSING_FACILITY): Payer: Medicare Other | Admitting: Internal Medicine

## 2016-04-09 DIAGNOSIS — R21 Rash and other nonspecific skin eruption: Secondary | ICD-10-CM | POA: Diagnosis not present

## 2016-04-13 DIAGNOSIS — L97419 Non-pressure chronic ulcer of right heel and midfoot with unspecified severity: Secondary | ICD-10-CM | POA: Diagnosis not present

## 2016-04-15 ENCOUNTER — Encounter: Payer: Self-pay | Admitting: Internal Medicine

## 2016-04-15 DIAGNOSIS — I110 Hypertensive heart disease with heart failure: Secondary | ICD-10-CM | POA: Insufficient documentation

## 2016-04-15 NOTE — Progress Notes (Signed)
MRN: [id number] Name: [name]  Sex: male Age: 70 y.o. DOB: [birth year]  [place] #:  Facility/Room:[facility] Level Of Care: SNF Provider: Inocencio Homes D Emergency Contacts: Extended Emergency Contact Information Primary Emergency Contact: Whiting,[name] Address: [address]          [place], [place] [address] [name] of [place] Phone: [phone] Relation: Friend Secondary Emergency Contact: [name] [place] of [place] Phone: [phone] Relation: None  Code Status:   Allergies: Ace inhibitors; Lipitor [atorvastatin calcium]; Metformin and related; Cefadroxil; Cephalexin; Morphine and related; and Robaxin [methocarbamol]  Chief Complaint  Patient presents with  . Acute Visit    HPI: Patient is 70 y.o. male who nursing has asked me to see for a new rash on BLE. Onset maybe 2 weeks ago per pt. Not itchy, maybe painful. No fever, myalgias or other systemic symptoms.  Past Medical History:  Diagnosis Date  . Arthritis    "hands, ankles, knees" (11/10/2014)  . Bladder spasms   . CAD (coronary artery disease)   . CAD in native artery 09/15/2015  . CHF (congestive heart failure) ([place])   . Chronic indwelling Foley catheter   . Chronic lower back pain   . Depression   . Diabetic diarrhea ([place])   . Diabetic neuropathy, painful ([place])   . DVT (deep venous thrombosis) ([facility]) 1980's   LLE  . GERD (gastroesophageal reflux disease)   . Hyperlipidemia   . Hypertension   . Neurogenic bladder   . Obese   . OSA (obstructive sleep apnea)    "they wanted me to wear a mask; I couldn't" (11/10/2014)  . Pneumonia 1999  . PONV (postoperative nausea and vomiting)   . Stroke [facility]) 10/2011; 06/2014   right hand numbness/notes 10/20/2011, pt not sure he had stroke in 2013; "right hand weaker and right face not quite right since" (11/10/2014)  . Type II diabetes mellitus ([place])     Past Surgical History:  Procedure Laterality Date  . CARDIAC CATHETERIZATION N/A  05/02/2015   Procedure: Left Heart Cath and Coronary Angiography;  Surgeon: Lorretta Harp, MD;  Location: [place] CV LAB;  Service: Cardiovascular;  Laterality: N/A;  . CARDIAC CATHETERIZATION N/A 05/04/2015   Procedure: Left Heart Cath and Coronary Angiography;  Surgeon: Wellington Hampshire, MD;  Location: [place] CV [facility];  Service: Cardiovascular;  Laterality: N/A;  . CARPAL TUNNEL RELEASE Bilateral ~ 2003-2004  . CHOLECYSTECTOMY OPEN  1970's?  Marland Kitchen GASTROPLASTY    . JOINT REPLACEMENT    . LEFT HEART CATHETERIZATION WITH CORONARY ANGIOGRAM N/A 10/24/2011   Procedure: LEFT HEART CATHETERIZATION WITH CORONARY ANGIOGRAM;  Surgeon: Candee Furbish, MD;  Location: [facility] CATH LAB;  Service: Cardiovascular;  Laterality: N/A;  right radial artery approach  . TOTAL KNEE ARTHROPLASTY Right 1990's      Medication List       Accurate as of 04/09/16 11:59 PM. Always use your most recent med list.          aluminum-magnesium hydroxide-simethicone [id number] MG/5ML Susp Commonly known as:  MAALOX Take 30 mLs by mouth every 6 (six) hours as needed.   aspirin EC 81 MG tablet Take 81 mg by mouth daily. 0900   escitalopram 10 MG tablet Commonly known as:  LEXAPRO Take 10 mg by mouth daily.   feeding supplement (PRO-STAT SUGAR FREE 64) Liqd Take 30 mLs by mouth 3 (three) times daily with meals. Give 0900, 1500, 2100   furosemide 40 MG tablet Commonly known as:  LASIX  Take 40 mg by mouth 2 (two) times daily. 0800 , 2100   gabapentin 100 MG capsule Commonly known as:  NEURONTIN Take 1 capsule (100 mg total) by mouth 2 (two) times daily.   hydrOXYzine 25 MG tablet Commonly known as:  ATARAX/VISTARIL Take 25 mg by mouth 4 (four) times daily.   insulin NPH Human 100 UNIT/ML injection Commonly known as:  HUMULIN N,NOVOLIN N Inject 40 Units into the skin 2 (two) times daily. 800 am and 10pm   linagliptin 5 MG Tabs tablet Commonly known as:  TRADJENTA Take 5 mg by mouth daily.   magnesium  oxide 400 MG tablet Commonly known as:  MAG-OX Take 400 mg by mouth daily. 0900   metoprolol tartrate 25 MG tablet Commonly known as:  LOPRESSOR 0.5 tablet by mouth two times a day 0900 , 2100   nitroGLYCERIN 0.4 MG SL tablet Commonly known as:  NITROSTAT Place 0.4 mg under the tongue every 5 (five) minutes as needed for chest pain.   NORVASC 5 MG tablet Generic drug:  amLODipine Take 5 mg by mouth daily.   omeprazole 40 MG capsule Commonly known as:  PRILOSEC Take 40 mg by mouth daily. 0600   potassium chloride 20 MEQ packet Commonly known as:  KLOR-CON Take 40 mEq by mouth daily. 0800   rOPINIRole 0.5 MG tablet Commonly known as:  REQUIP Take 0.5 mg by mouth at bedtime. 2100   sennosides-docusate sodium 8.6-50 MG tablet Commonly known as:  SENOKOT-S Take 2 tablets by mouth at bedtime. 2100   traMADol-acetaminophen 37.5-325 MG tablet Commonly known as:  ULTRACET Take 2 tablets by mouth every 6 (six) hours as needed for moderate pain.   triamcinolone cream 0.1 % Commonly known as:  KENALOG Apply 1 application topically 2 (two) times daily.   vitamin C 500 MG tablet Commonly known as:  ASCORBIC ACID Take 500 mg by mouth 2 (two) times daily. 0900 and 2100   zinc sulfate 220 (50 Zn) MG capsule Take 220 mg by mouth daily.       No orders of the defined types were placed in this encounter.   Immunization History  Administered Date(s) Administered  . Influenza Split 10/12/2011, 10/13/2011  . Influenza,inj,Quad PF,36+ Mos 05/06/2015  . Influenza-Unspecified 06/10/2014  . PPD Test 10/13/2015    Social History  Substance Use Topics  . Smoking status: Former Smoker    Packs/day: 2.00    Years: 20.00    Types: Cigarettes    Quit date: 09/10/1976  . Smokeless tobacco: Never Used  . Alcohol use No     Comment: FORMER ALCOHOLIC; "sober since [id number]"    Review of Systems  DATA OBTAINED: from patient, nurse GENERAL:  no fevers, fatigue, appetite  changes SKIN: rash as per HPI HEENT: No complaint RESPIRATORY: No cough, wheezing, SOB CARDIAC: No chest pain, palpitations, lower extremity edema  GI: No abdominal pain, No N/V/D or constipation, No heartburn or reflux  GU: No dysuria, frequency or urgency, or incontinence  MUSCULOSKELETAL: No unrelieved bone/joint pain NEUROLOGIC: No headache, dizziness  PSYCHIATRIC: No overt anxiety or sadness  Vitals:   04/15/16 1958  BP: (!) 123/94  Pulse: 90  Resp: 20  Temp: 97.5 F (36.4 C)    Physical Exam  GENERAL APPEARANCE: Alert, conversant, No acute distress; very obese WM  SKIN: rash on UE ad descibed prior- flat round scabs on flesh colored base; rash on BLE - bullous, irregular in shape, < 1 cm, mildly erythematous base HEENT: Unremarkable  RESPIRATORY: Breathing is even, unlabored. Lung sounds are clear   CARDIOVASCULAR: Heart RRR no murmurs, rubs or gallops. No peripheral edema  GASTROINTESTINAL: Abdomen is soft, non-tender, not distended w/ normal bowel sounds.  GENITOURINARY: Bladder non tender, not distended  MUSCULOSKELETAL: No abnormal joints or musculature NEUROLOGIC: Cranial nerves 2-12 grossly intact. Moves all extremities PSYCHIATRIC: Mood and affect appropriate to situation, no behavioral issues  Patient Active Problem List   Diagnosis Date Noted  . Hypertensive heart disease with CHF (congestive heart failure) ([place]) 04/15/2016  . Infection due to ESBL-producing Escherichia coli 01/22/2016  . Klebsiella cystitis 01/22/2016  . H/O: CVA (cerebrovascular accident) 01/22/2016  . Altered mental status   . Acute encephalopathy 01/11/2016  . Sepsis ([place]) 01/11/2016  . UTI (lower urinary tract infection) 01/11/2016  . Epididymo-orchitis 01/11/2016  . Depression   . Rash and nonspecific skin eruption 11/07/2015  . GERD (gastroesophageal reflux disease) 10/22/2015  . Restless legs 10/22/2015  . Chronic venous stasis dermatitis of both lower extremities 10/08/2015  .  CAD in native artery 09/15/2015  . PVC's (premature ventricular contractions) 09/15/2015  . Sinus pause 09/14/2015  . Morbid obesity ([place]) 07/03/2015  . Uncontrolled type II diabetes with peripheral autonomic neuropathy ([place]) 05/09/2015  . Hyperlipidemia LDL goal <70 05/02/2015  . Abnormal nuclear stress test 05/02/2015  . Neurogenic bladder 04/26/2015  . Pressure ulcer 04/15/2015  . UTI (urinary tract infection) due to urinary indwelling catheter ([place]) 04/14/2015  . History of DVT (deep vein thrombosis) 12/08/2014  . Chronic diastolic CHF (congestive heart failure) ([place]) 04/01/2014  . Chronic indwelling Foley catheter 12/23/2013  . CVA (cerebral infarction) 03/11/2012  . NSVT (nonsustained ventricular tachycardia) ([place]) 10/26/2011  . DM (diabetes mellitus) with complications ([place]) [id number]  . Diabetic neuropathy ([place]) 10/10/2011  . OSA (obstructive sleep apnea) 10/10/2011    CBC    Component Value Date/Time   WBC 15.7 05/09/2016   WBC 17.5 (H) 05/05/2016 1425   RBC 4.27 05/05/2016 1425   HGB 11.6 (A) 05/09/2016   HCT 36 (A) 05/09/2016   PLT 364 05/09/2016   MCV 88.1 05/05/2016 1425   LYMPHSABS 1.7 05/05/2016 1425   MONOABS 1.3 (H) 05/05/2016 1425   EOSABS 3.0 (H) 05/05/2016 1425   BASOSABS 0.1 05/05/2016 1425    CMP     Component Value Date/Time   NA 137 05/09/2016   K 4.5 05/09/2016   CL 98 (L) 05/05/2016 1425   CO2 29 05/05/2016 1425   GLUCOSE 105 (H) 05/05/2016 1425   BUN 22 (A) 05/09/2016   CREATININE 0.8 05/09/2016   CREATININE 0.69 05/05/2016 1425   CREATININE 0.81 09/20/2014 1537   CALCIUM 8.6 (L) 05/05/2016 1425   PROT 6.4 (L) 01/11/2016 1442   ALBUMIN 2.9 (L) 01/11/2016 1442   AST 17 01/11/2016 1442   ALT 13 (L) 01/11/2016 1442   ALKPHOS 102 01/11/2016 1442   BILITOT 0.6 01/11/2016 1442   GFRNONAA >60 05/05/2016 1425   GFRAA >60 05/05/2016 1425    Assessment and Plan  BULLOUS RASH - I reviewed pt's meds  And tradgenta is new and it does cause a  bullous rash; tradgenta has been d/c; if this is bullous pemphigoid pt needs a dx before he is put on weeks of steroids; have written for Derm consult;if that doesn't work will send to wound clinic and get a biopsy which I have had to do before;will monitor  [place]- looks a little better, no excoriations; triamcinalone is not helping will d/c, atarax is  helping, there are less excoriations, will keep that.   Time spent > 25 min;> 50% of time with patient was spent reviewing records, labs, tests and studies, counseling and developing plan of care  Hennie Duos, MD

## 2016-04-15 NOTE — Assessment & Plan Note (Signed)
Antibiotics being used for LE cellulitis are not improving arms, triamcinolone lotion and steroid burst did not improve; the atarax HAS improved so will kep it, d/c  the lotion; dermatology consult has been made

## 2016-04-18 DIAGNOSIS — Z79899 Other long term (current) drug therapy: Secondary | ICD-10-CM | POA: Diagnosis not present

## 2016-04-18 DIAGNOSIS — N390 Urinary tract infection, site not specified: Secondary | ICD-10-CM | POA: Diagnosis not present

## 2016-04-18 DIAGNOSIS — R319 Hematuria, unspecified: Secondary | ICD-10-CM | POA: Diagnosis not present

## 2016-04-18 DIAGNOSIS — E08311 Diabetes mellitus due to underlying condition with unspecified diabetic retinopathy with macular edema: Secondary | ICD-10-CM | POA: Diagnosis not present

## 2016-04-25 DIAGNOSIS — L989 Disorder of the skin and subcutaneous tissue, unspecified: Secondary | ICD-10-CM | POA: Diagnosis not present

## 2016-04-25 DIAGNOSIS — R4182 Altered mental status, unspecified: Secondary | ICD-10-CM | POA: Diagnosis not present

## 2016-05-04 ENCOUNTER — Non-Acute Institutional Stay (SKILLED_NURSING_FACILITY): Payer: Medicare Other | Admitting: Internal Medicine

## 2016-05-04 DIAGNOSIS — R238 Other skin changes: Secondary | ICD-10-CM | POA: Diagnosis not present

## 2016-05-05 ENCOUNTER — Encounter (HOSPITAL_COMMUNITY): Payer: Self-pay | Admitting: Emergency Medicine

## 2016-05-05 ENCOUNTER — Emergency Department (HOSPITAL_COMMUNITY)
Admission: EM | Admit: 2016-05-05 | Discharge: 2016-05-05 | Disposition: A | Discharge status: Home / Self Care | Payer: Medicare Other | Attending: Emergency Medicine | Admitting: Emergency Medicine

## 2016-05-05 DIAGNOSIS — I5032 Chronic diastolic (congestive) heart failure: Secondary | ICD-10-CM | POA: Insufficient documentation

## 2016-05-05 DIAGNOSIS — G0490 Encephalitis and encephalomyelitis, unspecified: Secondary | ICD-10-CM | POA: Insufficient documentation

## 2016-05-05 DIAGNOSIS — L986 Other infiltrative disorders of the skin and subcutaneous tissue: Secondary | ICD-10-CM | POA: Diagnosis not present

## 2016-05-05 DIAGNOSIS — Z791 Long term (current) use of non-steroidal anti-inflammatories (NSAID): Secondary | ICD-10-CM | POA: Diagnosis not present

## 2016-05-05 DIAGNOSIS — T148 Other injury of unspecified body region: Secondary | ICD-10-CM | POA: Diagnosis not present

## 2016-05-05 DIAGNOSIS — R238 Other skin changes: Secondary | ICD-10-CM | POA: Diagnosis not present

## 2016-05-05 DIAGNOSIS — Z79899 Other long term (current) drug therapy: Secondary | ICD-10-CM | POA: Insufficient documentation

## 2016-05-05 DIAGNOSIS — I110 Hypertensive heart disease with heart failure: Secondary | ICD-10-CM | POA: Diagnosis not present

## 2016-05-05 DIAGNOSIS — R030 Elevated blood-pressure reading, without diagnosis of hypertension: Secondary | ICD-10-CM | POA: Diagnosis not present

## 2016-05-05 DIAGNOSIS — Z7982 Long term (current) use of aspirin: Secondary | ICD-10-CM | POA: Diagnosis not present

## 2016-05-05 DIAGNOSIS — E119 Type 2 diabetes mellitus without complications: Secondary | ICD-10-CM | POA: Insufficient documentation

## 2016-05-05 DIAGNOSIS — Z87891 Personal history of nicotine dependence: Secondary | ICD-10-CM | POA: Diagnosis not present

## 2016-05-05 DIAGNOSIS — I2510 Atherosclerotic heart disease of native coronary artery without angina pectoris: Secondary | ICD-10-CM | POA: Diagnosis not present

## 2016-05-05 DIAGNOSIS — R55 Syncope and collapse: Secondary | ICD-10-CM | POA: Diagnosis not present

## 2016-05-05 DIAGNOSIS — M79606 Pain in leg, unspecified: Secondary | ICD-10-CM | POA: Diagnosis not present

## 2016-05-05 DIAGNOSIS — L8992 Pressure ulcer of unspecified site, stage 2: Secondary | ICD-10-CM | POA: Diagnosis not present

## 2016-05-05 DIAGNOSIS — L139 Bullous disorder, unspecified: Secondary | ICD-10-CM | POA: Diagnosis not present

## 2016-05-05 DIAGNOSIS — R21 Rash and other nonspecific skin eruption: Secondary | ICD-10-CM | POA: Diagnosis not present

## 2016-05-05 DIAGNOSIS — S41009A Unspecified open wound of unspecified shoulder, initial encounter: Secondary | ICD-10-CM | POA: Diagnosis not present

## 2016-05-05 DIAGNOSIS — Z794 Long term (current) use of insulin: Secondary | ICD-10-CM | POA: Insufficient documentation

## 2016-05-05 LAB — BASIC METABOLIC PANEL
ANION GAP: 7 (ref 5–15)
BUN: 13 mg/dL (ref 6–20)
CALCIUM: 8.6 mg/dL — AB (ref 8.9–10.3)
CO2: 29 mmol/L (ref 22–32)
Chloride: 98 mmol/L — ABNORMAL LOW (ref 101–111)
Creatinine, Ser: 0.69 mg/dL (ref 0.61–1.24)
GFR calc Af Amer: >60 mL/min (ref >60)
GFR calc non Af Amer: >60 mL/min (ref >60)
GLUCOSE: 105 mg/dL — AB (ref 65–99)
Potassium: 3.8 mmol/L (ref 3.5–5.1)
Sodium: 134 mmol/L — ABNORMAL LOW (ref 135–145)

## 2016-05-05 LAB — CBC WITH DIFFERENTIAL/PLATELET
BASOS ABS: 0.1 10*3/uL (ref 0.0–0.1)
Basophils Relative: 1 %
EOS PCT: 17 %
Eosinophils Absolute: 3 10*3/uL — ABNORMAL HIGH (ref 0.0–0.7)
HEMATOCRIT: 37.6 % — AB (ref 39.0–52.0)
Hemoglobin: 12.9 g/dL — ABNORMAL LOW (ref 13.0–17.0)
LYMPHS ABS: 1.7 10*3/uL (ref 0.7–4.0)
LYMPHS PCT: 10 %
MCH: 30.2 pg (ref 26.0–34.0)
MCHC: 34.3 g/dL (ref 30.0–36.0)
MCV: 88.1 fL (ref 78.0–100.0)
MONO ABS: 1.3 10*3/uL — AB (ref 0.1–1.0)
MONOS PCT: 7 %
Neutro Abs: 11.4 10*3/uL — ABNORMAL HIGH (ref 1.7–7.7)
Neutrophils Relative %: 65 %
Platelets: 365 10*3/uL (ref 150–400)
RBC: 4.27 MIL/uL (ref 4.22–5.81)
RDW: 13.3 % (ref 11.5–15.5)
WBC: 17.5 10*3/uL — ABNORMAL HIGH (ref 4.0–10.5)

## 2016-05-05 LAB — SEDIMENTATION RATE: Sed Rate: 60 mm/hr — ABNORMAL HIGH (ref 0–16)

## 2016-05-05 MED ORDER — OXYCODONE-ACETAMINOPHEN 5-325 MG PO TABS
2.0000 | ORAL_TABLET | Freq: Once | ORAL | Status: AC
  Administered 2016-05-05: 2 via ORAL
  Filled 2016-05-05: qty 2

## 2016-05-05 MED ORDER — LIDOCAINE HCL (PF) 1 % IJ SOLN: 5.0000 mL | Freq: Once | INTRAMUSCULAR | Status: DC

## 2016-05-05 MED ORDER — VALACYCLOVIR HCL 500 MG PO TABS
500.0000 mg | ORAL_TABLET | Freq: Once | ORAL | Status: DC
  Filled 2016-05-05: qty 1

## 2016-05-05 MED ORDER — PREDNISONE 20 MG PO TABS: 20.0000 mg | ORAL_TABLET | Freq: Every day | ORAL | 0 refills | Status: DC

## 2016-05-05 MED ORDER — DOXYCYCLINE HYCLATE 100 MG PO CAPS: 100.0000 mg | ORAL_CAPSULE | Freq: Two times a day (BID) | ORAL | 0 refills | Status: DC

## 2016-05-05 MED ORDER — DOXYCYCLINE HYCLATE 100 MG PO TABS
100.0000 mg | ORAL_TABLET | Freq: Once | ORAL | Status: AC
  Administered 2016-05-05: 100 mg via ORAL
  Filled 2016-05-05: qty 1

## 2016-05-05 MED ORDER — PREDNISONE 20 MG PO TABS: 40.0000 mg | ORAL_TABLET | Freq: Once | ORAL | Status: DC

## 2016-05-05 MED ORDER — LIDOCAINE HCL 1 % IJ SOLN
INTRAMUSCULAR | Status: AC
  Filled 2016-05-05: qty 20

## 2016-05-05 MED ORDER — PREDNISONE 20 MG PO TABS
60.0000 mg | ORAL_TABLET | Freq: Once | ORAL | Status: AC
  Administered 2016-05-05: 60 mg via ORAL
  Filled 2016-05-05: qty 3

## 2016-05-05 MED ORDER — TRAMADOL HCL 50 MG PO TABS: 50.0000 mg | ORAL_TABLET | Freq: Once | ORAL | Status: DC

## 2016-05-05 NOTE — ED Triage Notes (Addendum)
Per EMS pt from [facility] with complaint of generalized sores all over body for two months. Pt states possible "allergic reaction." Pt denies new detergents or creams.

## 2016-05-05 NOTE — ED Notes (Signed)
Bed: [id number] Expected date:  Expected time:  Means of arrival:  Comments: EMS-bed sores/bariatric

## 2016-05-05 NOTE — ED Notes (Signed)
Punch skin biopsy performed by Dr. Jeneen Rinks to [facility]. Formalin obtained from OR for specimen.

## 2016-05-05 NOTE — ED Notes (Signed)
[facility] called to take pt back to [facility].

## 2016-05-05 NOTE — ED Notes (Signed)
[facility] here to take pt back to [facility].

## 2016-05-05 NOTE — ED Notes (Signed)
BLOOD CULTURE #1 DRAWN AND SENT. LAC 5CC PER BOTTLE.

## 2016-05-05 NOTE — ED Notes (Signed)
Report given to Magda Paganini, Therapist, sports at [facility].

## 2016-05-05 NOTE — Discharge Instructions (Signed)
Keep your appointment with her dermatologist.  Results of your skin biopsy should be available at the time of your appointment

## 2016-05-05 NOTE — ED Provider Notes (Signed)
[place] DEPT Provider Note   CSN: [id number] Arrival date & time: 05/05/16  1207                      History   Chief Complaint Chief Complaint  Patient presents with  . Rash    HPI [name] is a 70 y.o. male.  He presents with a complaint of a skin rash for 3 months. States he was admitted to assisted living facility. He states "they changed all of my medicines". This happened 3 months ago. He states he's had a rash since that time. They start as small red dots. It became larger than form blisters. It is rupture" stayed around state irritated". He states they are painful and do itch. He has not had fever.  Has not been on antibiotic. He has not been on medications typical for allergic reaction or drug reaction. No medicines typical for pemphigoid.  HPI  Past Medical History:  Diagnosis Date  . Arthritis    "hands, ankles, knees" (11/10/2014)  . Bladder spasms   . CAD (coronary artery disease)   . CAD in native artery 09/15/2015  . CHF (congestive heart failure) ([place])   . Chronic indwelling Foley catheter   . Chronic lower back pain   . Depression   . Diabetic diarrhea ([place])   . Diabetic neuropathy, painful ([place])   . DVT (deep venous thrombosis) ([facility]) 1980's   LLE  . GERD (gastroesophageal reflux disease)   . Hyperlipidemia   . Hypertension   . Neurogenic bladder   . Obese   . OSA (obstructive sleep apnea)    "they wanted me to wear a mask; I couldn't" (11/10/2014)  . Pneumonia 1999  . PONV (postoperative nausea and vomiting)   . Stroke [facility]) 10/2011; 06/2014   right hand numbness/notes 10/20/2011, pt not sure he had stroke in 2013; "right hand weaker and right face not quite right since" (11/10/2014)  . Type II diabetes mellitus [facility])     Patient Active Problem List   Diagnosis Date Noted  . Hypertensive heart disease with CHF (congestive heart failure) ([place]) 04/15/2016  . Infection due to ESBL-producing Escherichia coli 01/22/2016  .  Klebsiella cystitis 01/22/2016  . H/O: CVA (cerebrovascular accident) 01/22/2016  . Altered mental status   . Acute encephalopathy 01/11/2016  . Sepsis ([place]) 01/11/2016  . UTI (lower urinary tract infection) 01/11/2016  . Epididymo-orchitis 01/11/2016  . Depression   . Rash and nonspecific skin eruption 11/07/2015  . GERD (gastroesophageal reflux disease) 10/22/2015  . Restless legs 10/22/2015  . Chronic venous stasis dermatitis of both lower extremities 10/08/2015  . CAD in native artery 09/15/2015  . PVC's (premature ventricular contractions) 09/15/2015  . Sinus pause 09/14/2015  . Morbid obesity ([place]) 07/03/2015  . Uncontrolled type II diabetes with peripheral autonomic neuropathy ([place]) 05/09/2015  . Hyperlipidemia LDL goal <70 05/02/2015  . Abnormal nuclear stress test 05/02/2015  . Neurogenic bladder 04/26/2015  . Pressure ulcer 04/15/2015  . UTI (urinary tract infection) due to urinary indwelling catheter ([place]) 04/14/2015  . History of DVT (deep vein thrombosis) 12/08/2014  . Chronic diastolic CHF (congestive heart failure) ([place]) 04/01/2014  . Chronic indwelling Foley catheter 12/23/2013  . CVA (cerebral infarction) 03/11/2012  . NSVT (nonsustained ventricular tachycardia) ([place]) 10/26/2011  . DM (diabetes mellitus) with complications ([place]) [id number]  . Diabetic neuropathy ([place]) 10/10/2011  . OSA (obstructive sleep apnea) 10/10/2011    Past Surgical History:  Procedure Laterality Date  .  CARDIAC CATHETERIZATION N/A 05/02/2015   Procedure: Left Heart Cath and Coronary Angiography;  Surgeon: Lorretta Harp, MD;  Location: [place] CV LAB;  Service: Cardiovascular;  Laterality: N/A;  . CARDIAC CATHETERIZATION N/A 05/04/2015   Procedure: Left Heart Cath and Coronary Angiography;  Surgeon: Wellington Hampshire, MD;  Location: [place] CV LAB;  Service: Cardiovascular;  Laterality: N/A;  . CARPAL TUNNEL RELEASE Bilateral ~ 2003-2004  . CHOLECYSTECTOMY OPEN  1970's?  Marland Kitchen  GASTROPLASTY    . JOINT REPLACEMENT    . LEFT HEART CATHETERIZATION WITH CORONARY ANGIOGRAM N/A 10/24/2011   Procedure: LEFT HEART CATHETERIZATION WITH CORONARY ANGIOGRAM;  Surgeon: Candee Furbish, MD;  Location: [facility] CATH LAB;  Service: Cardiovascular;  Laterality: N/A;  right radial artery approach  . TOTAL KNEE ARTHROPLASTY Right 1990's       Home Medications    Prior to Admission medications   Medication Sig Start Date End Date Taking? Authorizing Provider  aluminum-magnesium hydroxide-simethicone (MAALOX) [id number] MG/5ML SUSP Take 30 mLs by mouth every 6 (six) hours as needed.    Historical Provider, MD  Amino Acids-Protein Hydrolys (FEEDING SUPPLEMENT, PRO-STAT SUGAR FREE 64,) LIQD Take 30 mLs by mouth 3 (three) times daily with meals. Give 0900, 1500, 2100    Historical Provider, MD  amLODipine (NORVASC) 5 MG tablet Take 5 mg by mouth daily.    Historical Provider, MD  aspirin EC 81 MG tablet Take 81 mg by mouth daily. 0900    Historical Provider, MD  doxycycline (VIBRAMYCIN) 100 MG capsule Take 1 capsule (100 mg total) by mouth 2 (two) times daily. 05/05/16   Tanna Furry, MD  escitalopram (LEXAPRO) 10 MG tablet Take 10 mg by mouth daily. 1000    Historical Provider, MD  furosemide (LASIX) 40 MG tablet Take 40 mg by mouth 2 (two) times daily. 0800 , 2100    Historical Provider, MD  gabapentin (NEURONTIN) 100 MG capsule Take 1 capsule (100 mg total) by mouth 2 (two) times daily. 01/16/16   Reyne Dumas, MD  hydrOXYzine (ATARAX/VISTARIL) 25 MG tablet Take 25 mg by mouth 4 (four) times daily. 1000 1800    Historical Provider, MD  insulin NPH Human (HUMULIN N,NOVOLIN N) 100 UNIT/ML injection Inject 35 Units into the skin 2 (two) times daily. 800 am and 10pm    Historical Provider, MD  linagliptin (TRADJENTA) 5 MG TABS tablet Take 5 mg by mouth daily.    Historical Provider, MD  magnesium oxide (MAG-OX) 400 MG tablet Take 400 mg by mouth daily. 0900    Historical Provider, MD  metoprolol tartrate  (LOPRESSOR) 25 MG tablet 0.5 tablet by mouth two times a day 0900 , 2100    Historical Provider, MD  nitroGLYCERIN (NITROSTAT) 0.4 MG SL tablet Place 0.4 mg under the tongue every 5 (five) minutes as needed for chest pain.    Historical Provider, MD  omeprazole (PRILOSEC) 40 MG capsule Take 40 mg by mouth daily. 0600    Historical Provider, MD  potassium chloride (KLOR-CON) 20 MEQ packet Take 40 mEq by mouth daily. 0800    Historical Provider, MD  predniSONE (DELTASONE) 20 MG tablet Take 1 tablet (20 mg total) by mouth daily with breakfast. 05/05/16   Tanna Furry, MD  rOPINIRole (REQUIP) 0.5 MG tablet Take 0.5 mg by mouth at bedtime. 2100    Historical Provider, MD  sennosides-docusate sodium (SENOKOT-S) 8.6-50 MG tablet Take 2 tablets by mouth at bedtime. 2100    Historical Provider, MD  traMADol-acetaminophen (ULTRACET) 37.5-325 MG  tablet Take 2 tablets by mouth every 6 (six) hours as needed for moderate pain. 01/16/16   Reyne Dumas, MD  triamcinolone cream (KENALOG) 0.1 % Apply 1 application topically 2 (two) times daily.    Historical Provider, MD  vitamin C (ASCORBIC ACID) 500 MG tablet Take 500 mg by mouth 2 (two) times daily. 0900 and 2100    Historical Provider, MD  zinc sulfate 220 (50 Zn) MG capsule Take 220 mg by mouth daily.    Historical Provider, MD    Family History Family History  Problem Relation Age of Onset  . Diabetes type I Father   . Hypertension Mother   . Cancer Brother      Testicular Cancer  . Cancer Brother     Leukemia    Social History Social History  Substance Use Topics  . Smoking status: Former Smoker    Packs/day: 2.00    Years: 20.00    Types: Cigarettes    Quit date: 09/10/1976  . Smokeless tobacco: Never Used  . Alcohol use No     Comment: FORMER ALCOHOLIC; "sober since [id number]"     Allergies   Ace inhibitors; Lipitor [atorvastatin calcium]; Metformin and related; Cefadroxil; Cephalexin; Morphine and related; and Robaxin  [methocarbamol]   Review of Systems Review of Systems  Constitutional: Negative for appetite change, chills, diaphoresis, fatigue and fever.  HENT: Negative for mouth sores, sore throat and trouble swallowing.   Eyes: Negative for visual disturbance.  Respiratory: Negative for cough, chest tightness, shortness of breath and wheezing.   Cardiovascular: Negative for chest pain.  Gastrointestinal: Negative for abdominal distention, abdominal pain, diarrhea, nausea and vomiting.  Endocrine: Negative for polydipsia, polyphagia and polyuria.  Genitourinary: Negative for dysuria, frequency and hematuria.  Musculoskeletal: Negative for gait problem.  Skin: Positive for rash. Negative for color change and pallor.  Neurological: Negative for dizziness, syncope, light-headedness and headaches.  Hematological: Does not bruise/bleed easily.  Psychiatric/Behavioral: Negative for behavioral problems and confusion.     Physical Exam Updated Vital Signs BP 98/66 (BP Location: Right Arm)   Pulse 95   Temp 98 F (36.7 C) (Oral)   Resp 20   SpO2 96%   Physical Exam  Constitutional: He is oriented to person, place, and time. He appears well-developed and well-nourished. No distress.  HENT:  Head: Normocephalic.  Eyes: Conjunctivae are normal. Pupils are equal, round, and reactive to light. No scleral icterus.  Neck: Normal range of motion. Neck supple. No thyromegaly present.  Cardiovascular: Normal rate and regular rhythm.  Exam reveals no gallop and no friction rub.   No murmur heard. Pulmonary/Chest: Effort normal and breath sounds normal. No respiratory distress. He has no wheezes. He has no rales.  Abdominal: Soft. Bowel sounds are normal. He exhibits no distension. There is no tenderness. There is no rebound.  Musculoskeletal: Normal range of motion.  Neurological: He is alert and oriented to person, place, and time.  Skin: Skin is warm and dry. No rash noted.  Patient has multiple  lesions varying from a few millimeters to a few centimeters. They appear to be ruptured bullae. Several are macular. Some intact clear liquid bullae. Some appear to ruptured bullae with some surrounding erythema. Some mild cephalitis. No marked areas of cellulitis. They're present on the feet and lower legs greater than the arms, greater than the trunk. Not on the head ears face neck or back.  Psychiatric: He has a normal mood and affect. His behavior is normal.  ED Treatments / Results  Labs (all labs ordered are listed, but only abnormal results are displayed) Labs Reviewed  CBC WITH DIFFERENTIAL/PLATELET - Abnormal; Notable for the following:       Result Value   WBC 17.5 (*)    Hemoglobin 12.9 (*)    HCT 37.6 (*)    Neutro Abs 11.4 (*)    Monocytes Absolute 1.3 (*)    Eosinophils Absolute 3.0 (*)    All other components within normal limits  BASIC METABOLIC PANEL - Abnormal; Notable for the following:    Sodium 134 (*)    Chloride 98 (*)    Glucose, Bld 105 (*)    Calcium 8.6 (*)    All other components within normal limits  SEDIMENTATION RATE - Abnormal; Notable for the following:    Sed Rate 60 (*)    All other components within normal limits  DERMATOLOGY PATHOLOGY    EKG  EKG Interpretation None       Radiology No results found.  Procedures Procedures (including critical care time)  Medications Ordered in ED Medications  lidocaine (XYLOCAINE) 1 % (with pres) injection (not administered)  lidocaine (PF) (XYLOCAINE) 1 % injection 5 mL (not administered)  predniSONE (DELTASONE) tablet 60 mg (60 mg Oral Given 05/05/16 1615)  doxycycline (VIBRA-TABS) tablet 100 mg (100 mg Oral Given 05/05/16 1615)  oxyCODONE-acetaminophen (PERCOCET/ROXICET) 5-325 MG per tablet 2 tablet (2 tablets Oral Given 05/05/16 1614)     Initial Impression / Assessment and Plan / ED Course  I have reviewed the triage vital signs and the nursing notes.  Pertinent labs & imaging results  that were available during my care of the patient were reviewed by me and considered in my medical decision making (see chart for details).  Clinical Course    Patient states that he had a dermatology appointment from his care facility that he waited for for over a month. He states he got there and he states "I didn't get past the window". He states that a "screwed up the paperwork" and hadn't been seen yet. He states they were "trying to put some kind of a cream on it but they stopped".  Final Clinical Impressions(s) / ED Diagnoses   Final diagnoses:  Rash  Bullae   Patient has limited sedimentation rate. Leukocytosis. Is not febrile does not appear toxic. He is not tachycardic. States he feels otherwise well. Plan will be oral antibiotics. Low-dose prednisone. He was aware of and we discussed hyperglycemia. He states he has an existing dermatology appointment. Hopefully the skin biopsy performed today will help them to more quickly establishes diagnosis for specific treatment.  New Prescriptions New Prescriptions   DOXYCYCLINE (VIBRAMYCIN) 100 MG CAPSULE    Take 1 capsule (100 mg total) by mouth 2 (two) times daily.   PREDNISONE (DELTASONE) 20 MG TABLET    Take 1 tablet (20 mg total) by mouth daily with breakfast.     Tanna Furry, MD 05/05/16 1711

## 2016-05-06 NOTE — Progress Notes (Signed)
This is an acute visit.  Level care skilled.  Facility*mouth.  Chief complaint-acute visit follow-up skin lesions blisters.  History of present illness.  Patient is a pleasant 70 year old male who is had somewhat of a prolonged course here of skin lesions which appear to be progressing.  Apparently  started on his lower extremities but now extend to his upper thorax arms and upper back. .  These are blistered lesions some have opened up and left wound beds.  At one point he got a course of doxycycline but according nursing staff has not really helped.  He does have apparently a dermatology consult pending.  Currently his vital signs are stable he is not complaining of any fever or chills he is resting in bed comfortably but these lesions appear to be progressing. Past Medical History:  Diagnosis Date  . Arthritis    "hands, ankles, knees" (11/10/2014)  . Bladder spasms   . CAD (coronary artery disease)   . CAD in native artery 09/15/2015  . CHF (congestive heart failure) ([place])   . Chronic indwelling Foley catheter   . Chronic lower back pain   . Depression   . Diabetic diarrhea ([place])   . Diabetic neuropathy, painful ([place])   . DVT (deep venous thrombosis) ([facility]) 1980's   LLE  . GERD (gastroesophageal reflux disease)   . Hyperlipidemia   . Hypertension   . Neurogenic bladder   . Obese   . OSA (obstructive sleep apnea)    "they wanted me to wear a mask; I couldn't" (11/10/2014)  . Pneumonia 1999  . PONV (postoperative nausea and vomiting)   . Stroke [facility]) 10/2011; 06/2014   right hand numbness/notes 10/20/2011, pt not sure he had stroke in 2013; "right hand weaker and right face not quite right since" (11/10/2014)  . Type II diabetes mellitus ([place])          Past Surgical History:  Procedure Laterality Date  . CARDIAC CATHETERIZATION N/A 05/02/2015   Procedure: Left Heart Cath and Coronary Angiography;  Surgeon: Lorretta Harp, MD;  Location: [place] CV LAB;  Service: Cardiovascular;  Laterality: N/A;  . CARDIAC CATHETERIZATION N/A 05/04/2015   Procedure: Left Heart Cath and Coronary Angiography;  Surgeon: Wellington Hampshire, MD;  Location: [place] [facility] LAB;  Service: Cardiovascular;  Laterality: N/A;  . CARPAL TUNNEL RELEASE Bilateral ~ 2003-2004  . CHOLECYSTECTOMY OPEN  1970's?  Marland Kitchen GASTROPLASTY    . JOINT REPLACEMENT    . LEFT HEART CATHETERIZATION WITH CORONARY ANGIOGRAM N/A 10/24/2011   Procedure: LEFT HEART CATHETERIZATION WITH CORONARY ANGIOGRAM;  Surgeon: Candee Furbish, MD;  Location: [facility] CATH LAB;  Service: Cardiovascular;  Laterality: N/A;  right radial artery approach  . TOTAL KNEE ARTHROPLASTY Right 1990's          Medication List           Accurate as of 04/09/16 11:59 PM. Always use your most recent med list.           aluminum-magnesium hydroxide-simethicone [id number] MG/5ML Susp Commonly known as:  MAALOX Take 30 mLs by mouth every 6 (six) hours as needed.  aspirin EC 81 MG tablet Take 81 mg by mouth daily. 0900  escitalopram 10 MG tablet Commonly known as:  LEXAPRO Take 10 mg by mouth daily. 1000  feeding supplement (PRO-STAT SUGAR FREE 64) Liqd Take 30 mLs by mouth 3 (three) times daily with meals. Give 0900, 1500, 2100  furosemide 40 MG tablet Commonly known as:  LASIX Take  40 mg by mouth 2 (two) times daily. 0800 , 2100  gabapentin 100 MG capsule Commonly known as:  NEURONTIN Take 1 capsule (100 mg total) by mouth 2 (two) times daily.  hydrOXYzine 25 MG tablet Commonly known as:  ATARAX/VISTARIL Take 25 mg by mouth 4 (four) times daily. 1000 1800  insulin NPH Human 100 UNIT/ML injection Commonly known as:  HUMULIN N,NOVOLIN N Inject 35 Units into the skin 2 (two) times daily. 800 am and 10pm  linagliptin 5 MG Tabs tablet Commonly known as:  TRADJENTA Take 5 mg by mouth daily.  magnesium oxide 400 MG tablet Commonly known as:  MAG-OX Take 400 mg by mouth daily. 0900    metoprolol tartrate 25 MG tablet Commonly known as:  LOPRESSOR 0.5 tablet by mouth two times a day 0900 , 2100  nitroGLYCERIN 0.4 MG SL tablet Commonly known as:  NITROSTAT Place 0.4 mg under the tongue every 5 (five) minutes as needed for chest pain.  NORVASC 5 MG tablet Generic drug:  amLODipine Take 5 mg by mouth daily.  omeprazole 40 MG capsule Commonly known as:  PRILOSEC Take 40 mg by mouth daily. 0600  potassium chloride 20 MEQ packet Commonly known as:  KLOR-CON Take 40 mEq by mouth daily. 0800  rOPINIRole 0.5 MG tablet Commonly known as:  REQUIP Take 0.5 mg by mouth at bedtime. 2100  sennosides-docusate sodium 8.6-50 MG tablet Commonly known as:  SENOKOT-S Take 2 tablets by mouth at bedtime. 2100  traMADol-acetaminophen 37.5-325 MG tablet Commonly known as:  ULTRACET Take 2 tablets by mouth every 6 (six) hours as needed for moderate pain.  triamcinolone cream 0.1 % Commonly known as:  KENALOG Apply 1 application topically 2 (two) times daily.  vitamin C 500 MG tablet Commonly known as:  ASCORBIC ACID Take 500 mg by mouth 2 (two) times daily. 0900 and 2100  zinc sulfate 220 (50 Zn) MG capsule Take 220 mg by mouth daily.      .       Immunization History  Administered Date(s) Administered  . Influenza Split 10/12/2011, 10/13/2011  . Influenza,inj,Quad PF,36+ Mos 05/06/2015  . Influenza-Unspecified 06/10/2014  . PPD Test 10/13/2015          Social History  Substance Use Topics  . Smoking status: Former Smoker    Packs/day: 2.00    Years: 20.00    Types: Cigarettes    Quit date: 09/10/1976  . Smokeless tobacco: Never Used  . Alcohol use No     Comment: FORMER ALCOHOLIC; "sober since 123XX123"    Review of Systems  DATA OBTAINED: from patient, nurse, GENERAL:  no fevers, fatigue, appetite changes SKIN:  lesions as noted above HEENT: No complaint RESPIRATORY: No cough, wheezing, SOB CARDIAC: No chest pain, palpitations,   baseline lower extremity edema  GI: No abdominal pain, No N/V/D or constipation, No heartburn or reflux  GU: No dysuria, frequency or urgency, or incontinence  MUSCULOSKELETAL: No unrelieved bone/joint pain NEUROLOGIC: No headache, dizziness  PSYCHIATRIC: No overt anxiety or sadness  There were no vitals filed for this visit.  Physical Exam  T- 98.6 pulse 80 respirations 18 blood pressure 120/70 O2 saturation 98% on room air GENERAL APPEARANCE: Alert, conversant, No acute distress  SKIN: Has diffuse blistered type lesions extending from his feet and legs bilaterally extending to his upper thorax and upper back as well as arms-there are some open areas that appear to be wound beds from burst bullae-I do not really see   signs  of cellulitis however HEENT: Unremarkable pharynx is clear mucous membranes moist RESPIRATORY: Breathing is even, unlabored. Lung sounds are clear   CARDIOVASCULAR: Heart RRR with a 2/6 murmur trace-1+ peripheral edema  GASTROINTESTINAL: Abdomen is soft, non-tender, not distended w/ normal bowel sounds.   MUs CULOSKELETAL: No abnormal joints or musculature exam since patient is bed but appears able to move all extremities 4 with baseline strength NEUROLOGIC: Cranial nerves 2-12 grossly intact. Moves all extremities PSYCHIATRIC: Mood and affect appropriate to situation, no behavioral issues   Labs.  01/15/2016.  Sodium 135 potassium 4.5 BUN 15 creatinine 0.53.  WBC 7.4 hemoglobin 11.6 platelets 242.  Assessment and plan.  #1 dermatitis unspecified-this has been apparently evaluated earlier in the day by the wound care physician in the facility and recommended prednisone-will start this I discussed this with Dr. Eulas Post via phone as well-will start prednisone 30 mg daily 2 days-then 20 mg daily for 2 days-then 10 mg daily for 2 days and then DC.  Monitor closely for any increased erythema or drainage notify provider of changes- would have a low threshold for  starting doxycycline   also will update a CBC with differential and metabolic panel next lab day to keep an eye on his white count-he does not appear to be toxic appearing or septic but this will have to be watched.  Apparently doxycycline started previously was not very effective per nursing but would not rule out restarting this.  Clinically appears stable but will need dermatology follow-up and close monitoring-   [phone]

## 2016-05-07 ENCOUNTER — Non-Acute Institutional Stay (SKILLED_NURSING_FACILITY): Payer: Medicare Other | Admitting: Internal Medicine

## 2016-05-07 ENCOUNTER — Encounter: Payer: Self-pay | Admitting: Internal Medicine

## 2016-05-07 DIAGNOSIS — E1151 Type 2 diabetes mellitus with diabetic peripheral angiopathy without gangrene: Secondary | ICD-10-CM

## 2016-05-07 DIAGNOSIS — E1165 Type 2 diabetes mellitus with hyperglycemia: Secondary | ICD-10-CM | POA: Diagnosis not present

## 2016-05-07 DIAGNOSIS — R21 Rash and other nonspecific skin eruption: Secondary | ICD-10-CM

## 2016-05-07 DIAGNOSIS — IMO0002 Reserved for concepts with insufficient information to code with codable children: Secondary | ICD-10-CM

## 2016-05-07 DIAGNOSIS — I82891 Chronic embolism and thrombosis of other specified veins: Secondary | ICD-10-CM | POA: Diagnosis not present

## 2016-05-07 NOTE — Progress Notes (Signed)
Patient ID: [name], male   DOB: November 07, 1945, 70 y.o.   MRN: [id number]    DATE: 05/07/2016  Location:    [place] Room Number: [place] of Service: SNF (31)   Extended Emergency Contact Information Primary Emergency Contact: Whiting,[name] Address: [address]          [place],  [address] [name] of [place] Mobile Phone: [phone] Relation: Friend Secondary Emergency Contact: [name] [place] of [place] Phone: [phone] Relation: None  Advanced Directive information Does patient have an advance directive?: Yes, Type of Advance Directive: Out of facility DNR (pink MOST or yellow form), Does patient want to make changes to advanced directive?: No - Patient declined  Chief Complaint  Patient presents with  . Acute Visit    low extremity blisters    HPI:  70 yo male long term resident seen today for leg d/c and rash. He reports decreased pruritis but still sees new lesions appearing. He has been tx with abx recently and is currently on a prednisone taper. No f/c. Pain is stable. No other concerns. CBG 244. Last A1c 8.7%. He has a chronic foley DTG clear yellow urine.    Past Medical History:  Diagnosis Date  . Arthritis    "hands, ankles, knees" (11/10/2014)  . Bladder spasms   . CAD (coronary artery disease)   . CAD in native artery 09/15/2015  . CHF (congestive heart failure) ([place])   . Chronic indwelling Foley catheter   . Chronic lower back pain   . Depression   . Diabetic diarrhea ([place])   . Diabetic neuropathy, painful ([place])   . DVT (deep venous thrombosis) (HCC) 1980's   LLE  . GERD (gastroesophageal reflux disease)   . Hyperlipidemia   . Hypertension   . Neurogenic bladder   . Obese   . OSA (obstructive sleep apnea)    "they wanted me to wear a mask; I couldn't" (11/10/2014)  . Pneumonia 1999  . PONV (postoperative nausea and vomiting)   . Stroke [facility]) 10/2011; 06/2014   right hand numbness/notes 10/20/2011, pt  not sure he had stroke in 2013; "right hand weaker and right face not quite right since" (11/10/2014)  . Type II diabetes mellitus ([place])     Past Surgical History:  Procedure Laterality Date  . CARDIAC CATHETERIZATION N/A 05/02/2015   Procedure: Left Heart Cath and Coronary Angiography;  Surgeon: Lorretta Harp, MD;  Location: [place] CV LAB;  Service: Cardiovascular;  Laterality: N/A;  . CARDIAC CATHETERIZATION N/A 05/04/2015   Procedure: Left Heart Cath and Coronary Angiography;  Surgeon: Wellington Hampshire, MD;  Location: [place] CV LAB;  Service: Cardiovascular;  Laterality: N/A;  . CARPAL TUNNEL RELEASE Bilateral ~ 2003-2004  . CHOLECYSTECTOMY OPEN  1970's?  Marland Kitchen GASTROPLASTY    . JOINT REPLACEMENT    . LEFT HEART CATHETERIZATION WITH CORONARY ANGIOGRAM N/A 10/24/2011   Procedure: LEFT HEART CATHETERIZATION WITH CORONARY ANGIOGRAM;  Surgeon: Candee Furbish, MD;  Location: [facility] CATH LAB;  Service: Cardiovascular;  Laterality: N/A;  right radial artery approach  . TOTAL KNEE ARTHROPLASTY Right 1990's    Patient Care Team: Lavone Orn, MD as PCP - General (Internal Medicine)  Social History   Social History  . Marital status: Widowed    Spouse name: N/A  . Number of children: N/A  . Years of education: N/A   Occupational History  . Retired    Social History Main Topics  . Smoking status: Former  Smoker    Packs/day: 2.00    Years: 20.00    Types: Cigarettes    Quit date: 09/10/1976  . Smokeless tobacco: Never Used  . Alcohol use No     Comment: FORMER ALCOHOLIC; "sober since 11/29/2246"  . Drug use: No  . Sexual activity: No   Other Topics Concern  . Not on file   Social History Narrative   Lives alone.  No close family nearby.       reports that he quit smoking about 39 years ago. His smoking use included Cigarettes. He has a 40.00 pack-year smoking history. He has never used smokeless tobacco. He reports that he does not drink alcohol or use drugs.  Family History    Problem Relation Age of Onset  . Diabetes type I Father   . Hypertension Mother   . Cancer Brother      Testicular Cancer  . Cancer Brother     Leukemia   Family Status  Relation Status  . Father Deceased   DM/DVT  . Mother Deceased   old age  . Brother Alive  . Brother Alive  . Maternal Grandmother Deceased  . Maternal Grandfather Deceased  . Paternal Grandmother Deceased  . Paternal Grandfather Deceased  . Brother   . Brother     Immunization History  Administered Date(s) Administered  . Influenza Split 10/12/2011, 10/13/2011  . Influenza,inj,Quad PF,36+ Mos 05/06/2015  . Influenza-Unspecified 06/10/2014  . PPD Test 10/13/2015    Allergies  Allergen Reactions  . Ace Inhibitors Swelling    Pt tolerates lisinopril  . Lipitor [Atorvastatin Calcium] Swelling  . Metformin And Related Swelling  . Cefadroxil Hives  . Cephalexin Hives  . Morphine And Related Other (See Comments)    Sweating, feels like is "in rocky boat."  . Robaxin [Methocarbamol] Other (See Comments)    Feels like he is shaky    Medications: Patient's Medications  New Prescriptions   No medications on file  Previous Medications   ALUMINUM-MAGNESIUM HYDROXIDE-SIMETHICONE (MAALOX) 250-037-04 MG/5ML SUSP    Take 30 mLs by mouth every 6 (six) hours as needed.   AMINO ACIDS-PROTEIN HYDROLYS (FEEDING SUPPLEMENT, PRO-STAT SUGAR FREE 64,) LIQD    Take 60 mLs by mouth 3 (three) times daily with meals.   AMLODIPINE (NORVASC) 5 MG TABLET    Take 5 mg by mouth daily.   ASPIRIN EC 81 MG TABLET    Take 81 mg by mouth daily. 0900   DOXYCYCLINE (VIBRAMYCIN) 100 MG CAPSULE    Take 1 capsule (100 mg total) by mouth 2 (two) times daily.   ESCITALOPRAM (LEXAPRO) 10 MG TABLET    Take 10 mg by mouth daily.    GABAPENTIN (NEURONTIN) 100 MG CAPSULE    Take 1 capsule (100 mg total) by mouth 2 (two) times daily.   HYDROXYZINE (ATARAX/VISTARIL) 25 MG TABLET    Take 25 mg by mouth 4 (four) times daily.    INSULIN NPH  HUMAN (HUMULIN N,NOVOLIN N) 100 UNIT/ML INJECTION    Inject 40 Units into the skin 2 (two) times daily. 800 am and 10pm    MAGNESIUM OXIDE (MAG-OX) 400 MG TABLET    Take 400 mg by mouth daily. 0900   METOPROLOL TARTRATE (LOPRESSOR) 25 MG TABLET    0.5 tablet by mouth two times a day 0900 , 2100   NITROGLYCERIN (NITROSTAT) 0.4 MG SL TABLET    Place 0.4 mg under the tongue every 5 (five) minutes as needed for chest pain.  OMEPRAZOLE (PRILOSEC) 40 MG CAPSULE    Take 40 mg by mouth daily. 0600   POTASSIUM CHLORIDE (KLOR-CON) 20 MEQ PACKET    Take 40 mEq by mouth daily. 0800   PREDNISONE (DELTASONE) 10 MG TABLET    Take 10 mg by mouth daily at 6 (six) AM.   ROPINIROLE (REQUIP) 0.5 MG TABLET    Take 0.5 mg by mouth at bedtime. 2100   SENNOSIDES-DOCUSATE SODIUM (SENOKOT-S) 8.6-50 MG TABLET    Take 2 tablets by mouth at bedtime. 2100   TORSEMIDE (DEMADEX) 20 MG TABLET    Take 20 mg by mouth 2 (two) times daily.   TRAMADOL-ACETAMINOPHEN (ULTRACET) 37.5-325 MG TABLET    Take 2 tablets by mouth every 6 (six) hours as needed for moderate pain.   VITAMIN C (ASCORBIC ACID) 500 MG TABLET    Take 500 mg by mouth 2 (two) times daily. 0900 and 2100  Modified Medications   No medications on file  Discontinued Medications   AMINO ACIDS-PROTEIN HYDROLYS (FEEDING SUPPLEMENT, PRO-STAT SUGAR FREE 64,) LIQD    Take 30 mLs by mouth 3 (three) times daily with meals. Give 0900, 1500, 2100   FUROSEMIDE (LASIX) 40 MG TABLET    Take 40 mg by mouth 2 (two) times daily. 0800 , 2100   LINAGLIPTIN (TRADJENTA) 5 MG TABS TABLET    Take 5 mg by mouth daily.   PREDNISONE (DELTASONE) 20 MG TABLET    Take 1 tablet (20 mg total) by mouth daily with breakfast.   TRIAMCINOLONE CREAM (KENALOG) 0.1 %    Apply 1 application topically 2 (two) times daily.   ZINC SULFATE 220 (50 ZN) MG CAPSULE    Take 220 mg by mouth daily.    Review of Systems  Genitourinary: Positive for difficulty urinating.  Musculoskeletal: Positive for arthralgias  and gait problem.  Skin: Positive for rash.  All other systems reviewed and are negative.   Vitals:   05/07/16 1130  BP: 132/74  Pulse: 78  Resp: 18  Temp: 98.9 F (37.2 C)  TempSrc: Oral  SpO2: 98%  Weight: (!) 347 lb (157.4 kg)  Height: 6' 1"  (1.854 m)   Body mass index is 45.78 kg/m.  Physical Exam  Constitutional: He appears well-developed and well-nourished.  Siting up in bed in NAD  Cardiovascular:  Intact DP/PT pulses. +1 pitting LE edema b/l. No calf TTP  Genitourinary:  Genitourinary Comments: Foley DTG clear yellow urine  Musculoskeletal: He exhibits edema.  Neurological: He is alert.  Skin: Skin is warm. Rash (vesicular rash with b/l toes bleeding areas. no purulent d/c. multiple excoriations. open vesicles noted on legs. nodular. no secondary signs of infection) noted.  Psychiatric: He has a normal mood and affect. His behavior is normal. Thought content normal.     Labs reviewed: Admission on 05/05/2016, Discharged on 05/05/2016  Component Date Value Ref Range Status  . WBC 05/05/2016 17.5* 4.0 - 10.5 K/uL Final  . RBC 05/05/2016 4.27  4.22 - 5.81 MIL/uL Final  . Hemoglobin 05/05/2016 12.9* 13.0 - 17.0 g/dL Final  . HCT 05/05/2016 37.6* 39.0 - 52.0 % Final  . MCV 05/05/2016 88.1  78.0 - 100.0 fL Final  . MCH 05/05/2016 30.2  26.0 - 34.0 pg Final  . MCHC 05/05/2016 34.3  30.0 - 36.0 g/dL Final  . RDW 05/05/2016 13.3  11.5 - 15.5 % Final  . Platelets 05/05/2016 365  150 - 400 K/uL Final  . Neutrophils Relative % 05/05/2016 65  % Final  .  Neutro Abs 05/05/2016 11.4* 1.7 - 7.7 K/uL Final  . Lymphocytes Relative 05/05/2016 10  % Final  . Lymphs Abs 05/05/2016 1.7  0.7 - 4.0 K/uL Final  . Monocytes Relative 05/05/2016 7  % Final  . Monocytes Absolute 05/05/2016 1.3* 0.1 - 1.0 K/uL Final  . Eosinophils Relative 05/05/2016 17  % Final  . Eosinophils Absolute 05/05/2016 3.0* 0.0 - 0.7 K/uL Final  . Basophils Relative 05/05/2016 1  % Final  . Basophils  Absolute 05/05/2016 0.1  0.0 - 0.1 K/uL Final  . Sodium 05/05/2016 134* 135 - 145 mmol/L Final  . Potassium 05/05/2016 3.8  3.5 - 5.1 mmol/L Final  . Chloride 05/05/2016 98* 101 - 111 mmol/L Final  . CO2 05/05/2016 29  22 - 32 mmol/L Final  . Glucose, Bld 05/05/2016 105* 65 - 99 mg/dL Final  . BUN 05/05/2016 13  6 - 20 mg/dL Final  . Creatinine, Ser 05/05/2016 0.69  0.61 - 1.24 mg/dL Final  . Calcium 05/05/2016 8.6* 8.9 - 10.3 mg/dL Final  . GFR calc non Af Amer 05/05/2016 >60  >60 mL/min Final  . GFR calc Af Amer 05/05/2016 >60  >60 mL/min Final   Comment: (NOTE) The eGFR has been calculated using the CKD EPI equation. This calculation has not been validated in all clinical situations. eGFR's persistently <60 mL/min signify possible Chronic Kidney Disease.   . Anion gap 05/05/2016 7  5 - 15 Final  . Sed Rate 05/05/2016 60* 0 - 16 mm/hr Final  Nursing Home on 03/08/2016  Component Date Value Ref Range Status  . HM Diabetic Foot Exam 01/09/2016 completed by [facility]   Final    No results found.   Assessment/Plan   ICD-9-CM ICD-10-CM   1. Bullous rash 782.1 R21   2. Type II diabetes mellitus with peripheral circulatory disorder, uncontrolled (HCC) 250.72 E11.51    443.81 E11.65    Finish prednisone taper  May need dermatology f/u  Cont current meds as ordered  Tight glycemic control  PT/OT as indicated  Wound care as ordered  Will follow   Gemini Bunte S. Perlie Gold  [facility] and Adult Medicine [address] [place], [place] [address] [phone] Cell (Monday-Friday 8 AM - 5 PM) [phone] After 5 PM and follow prompts

## 2016-05-08 DIAGNOSIS — L120 Bullous pemphigoid: Secondary | ICD-10-CM | POA: Diagnosis not present

## 2016-05-08 DIAGNOSIS — Z79899 Other long term (current) drug therapy: Secondary | ICD-10-CM | POA: Diagnosis not present

## 2016-05-08 DIAGNOSIS — L309 Dermatitis, unspecified: Secondary | ICD-10-CM | POA: Diagnosis not present

## 2016-05-08 DIAGNOSIS — Z7401 Bed confinement status: Secondary | ICD-10-CM | POA: Diagnosis not present

## 2016-05-08 DIAGNOSIS — R531 Weakness: Secondary | ICD-10-CM | POA: Diagnosis not present

## 2016-05-09 DIAGNOSIS — E08311 Diabetes mellitus due to underlying condition with unspecified diabetic retinopathy with macular edema: Secondary | ICD-10-CM | POA: Diagnosis not present

## 2016-05-09 DIAGNOSIS — I82891 Chronic embolism and thrombosis of other specified veins: Secondary | ICD-10-CM | POA: Diagnosis not present

## 2016-05-09 LAB — BASIC METABOLIC PANEL
BUN: 22 mg/dL — AB (ref 4–21)
CREATININE: 0.8 mg/dL (ref 0.6–1.3)
Glucose: 264 mg/dL
Potassium: 4.5 mmol/L (ref 3.4–5.3)
Sodium: 137 mmol/L (ref 137–147)

## 2016-05-09 LAB — CBC AND DIFFERENTIAL
HCT: 36 % — AB (ref 41–53)
Hemoglobin: 11.6 g/dL — AB (ref 13.5–17.5)
PLATELETS: 364 10*3/uL (ref 150–399)
WBC: 15.7 10*3/mL

## 2016-05-11 ENCOUNTER — Non-Acute Institutional Stay (SKILLED_NURSING_FACILITY): Payer: Medicare Other | Admitting: Adult Health

## 2016-05-11 ENCOUNTER — Encounter: Payer: Self-pay | Admitting: Adult Health

## 2016-05-11 DIAGNOSIS — E1165 Type 2 diabetes mellitus with hyperglycemia: Secondary | ICD-10-CM | POA: Diagnosis not present

## 2016-05-11 DIAGNOSIS — E1141 Type 2 diabetes mellitus with diabetic mononeuropathy: Secondary | ICD-10-CM

## 2016-05-11 DIAGNOSIS — I2510 Atherosclerotic heart disease of native coronary artery without angina pectoris: Secondary | ICD-10-CM | POA: Diagnosis not present

## 2016-05-11 DIAGNOSIS — E1143 Type 2 diabetes mellitus with diabetic autonomic (poly)neuropathy: Secondary | ICD-10-CM | POA: Diagnosis not present

## 2016-05-11 DIAGNOSIS — N319 Neuromuscular dysfunction of bladder, unspecified: Secondary | ICD-10-CM

## 2016-05-11 DIAGNOSIS — G2581 Restless legs syndrome: Secondary | ICD-10-CM

## 2016-05-11 DIAGNOSIS — L109 Pemphigus, unspecified: Secondary | ICD-10-CM | POA: Diagnosis not present

## 2016-05-11 DIAGNOSIS — I110 Hypertensive heart disease with heart failure: Secondary | ICD-10-CM | POA: Diagnosis not present

## 2016-05-11 DIAGNOSIS — K219 Gastro-esophageal reflux disease without esophagitis: Secondary | ICD-10-CM | POA: Diagnosis not present

## 2016-05-11 DIAGNOSIS — L120 Bullous pemphigoid: Secondary | ICD-10-CM | POA: Diagnosis not present

## 2016-05-11 DIAGNOSIS — IMO0002 Reserved for concepts with insufficient information to code with codable children: Secondary | ICD-10-CM

## 2016-05-11 DIAGNOSIS — I5032 Chronic diastolic (congestive) heart failure: Secondary | ICD-10-CM

## 2016-05-11 NOTE — Progress Notes (Signed)
Patient ID: [name], male   DOB: 1946/02/26, 70 y.o.   MRN: [id number]   Location:   [place] Room Number: 216-B Place of Service:  SNF (31)   CODE STATUS: Full Code  Allergies  Allergen Reactions  . Ace Inhibitors Swelling    Pt tolerates lisinopril  . Lipitor [Atorvastatin Calcium] Swelling  . Metformin And Related Swelling  . Cefadroxil Hives  . Cephalexin Hives  . Morphine And Related Other (See Comments)    Sweating, feels like is "in rocky boat."  . Robaxin [Methocarbamol] Other (See Comments)    Feels like he is shaky    Chief Complaint  Patient presents with  . Medical Management of Chronic Issues     HPI:  He is a long term resident of this facility being seen for the management of his chronic illnesses. He has been seen by dermatology; he was diagnosed with bullous pemphigoid. He was started on methotrexate and prednisone; he is due to complete doxycycline. He tells me that he is feeling better and is happy that he went to see dermatology.    Past Medical History:  Diagnosis Date  . Arthritis    "hands, ankles, knees" (11/10/2014)  . Bladder spasms   . CAD (coronary artery disease)   . CAD in native artery 09/15/2015  . CHF (congestive heart failure) ([place])   . Chronic indwelling Foley catheter   . Chronic lower back pain   . Depression   . Diabetic diarrhea ([place])   . Diabetic neuropathy, painful ([place])   . DVT (deep venous thrombosis) (HCC) 1980's   LLE  . GERD (gastroesophageal reflux disease)   . Hyperlipidemia   . Hypertension   . Neurogenic bladder   . Obese   . OSA (obstructive sleep apnea)    "they wanted me to wear a mask; I couldn't" (11/10/2014)  . Pneumonia 1999  . PONV (postoperative nausea and vomiting)   . Stroke [facility]) 10/2011; 06/2014   right hand numbness/notes 10/20/2011, pt not sure he had stroke in 2013; "right hand weaker and right face not quite right since" (11/10/2014)  . Type II diabetes mellitus ([place])     Past  Surgical History:  Procedure Laterality Date  . CARDIAC CATHETERIZATION N/A 05/02/2015   Procedure: Left Heart Cath and Coronary Angiography;  Surgeon: Lorretta Harp, MD;  Location: [place] CV LAB;  Service: Cardiovascular;  Laterality: N/A;  . CARDIAC CATHETERIZATION N/A 05/04/2015   Procedure: Left Heart Cath and Coronary Angiography;  Surgeon: Wellington Hampshire, MD;  Location: [place] CV LAB;  Service: Cardiovascular;  Laterality: N/A;  . CARPAL TUNNEL RELEASE Bilateral ~ 2003-2004  . CHOLECYSTECTOMY OPEN  1970's?  Marland Kitchen GASTROPLASTY    . JOINT REPLACEMENT    . LEFT HEART CATHETERIZATION WITH CORONARY ANGIOGRAM N/A 10/24/2011   Procedure: LEFT HEART CATHETERIZATION WITH CORONARY ANGIOGRAM;  Surgeon: Candee Furbish, MD;  Location: [facility] CATH LAB;  Service: Cardiovascular;  Laterality: N/A;  right radial artery approach  . TOTAL KNEE ARTHROPLASTY Right 1990's    Social History   Social History  . Marital status: Widowed    Spouse name: N/A  . Number of children: N/A  . Years of education: N/A   Occupational History  . Retired    Social History Main Topics  . Smoking status: Former Smoker    Packs/day: 2.00    Years: 20.00    Types: Cigarettes    Quit date: 09/10/1976  . Smokeless tobacco: Never Used  .  Alcohol use No     Comment: FORMER ALCOHOLIC; "sober since 123XX123"  . Drug use: No  . Sexual activity: No   Other Topics Concern  . Not on file   Social History Narrative   Lives alone.  No close family nearby.     Family History  Problem Relation Age of Onset  . Diabetes type I Father   . Hypertension Mother   . Cancer Brother      Testicular Cancer  . Cancer Brother     Leukemia      VITAL SIGNS BP 99/66   Pulse 88   Temp 98.8 F (37.1 C) (Oral)   Resp 18   Ht 6\' 1"  (1.854 m)   Wt (!) 347 lb (157.4 kg)   SpO2 98%   BMI 45.78 kg/m   Patient's Medications  New Prescriptions   No medications on file  Previous Medications   ALUMINUM-MAGNESIUM  HYDROXIDE-SIMETHICONE (MAALOX) [id number] MG/5ML SUSP    Take 30 mLs by mouth every 6 (six) hours as needed.   AMINO ACIDS-PROTEIN HYDROLYS (FEEDING SUPPLEMENT, PRO-STAT SUGAR FREE 64,) LIQD    Take 60 mLs by mouth 3 (three) times daily with meals.   AMLODIPINE (NORVASC) 5 MG TABLET    Take 5 mg by mouth daily.   ASPIRIN EC 81 MG TABLET    Take 81 mg by mouth daily. 0900   DOXYCYCLINE (VIBRAMYCIN) 100 MG CAPSULE    Take 1 capsule (100 mg total) by mouth 2 (two) times daily.   ESCITALOPRAM (LEXAPRO) 10 MG TABLET    Take 10 mg by mouth daily.    FOLIC ACID (FOLVITE) 1 MG TABLET    Take 1 mg by mouth daily.   GABAPENTIN (NEURONTIN) 100 MG CAPSULE    Take 1 capsule (100 mg total) by mouth 2 (two) times daily.   HYDROXYZINE (ATARAX/VISTARIL) 25 MG TABLET    Take 25 mg by mouth 4 (four) times daily.    INSULIN NPH HUMAN (HUMULIN N,NOVOLIN N) 100 UNIT/ML INJECTION    Inject 40 Units into the skin 2 (two) times daily. 800 am and 10pm    MAGNESIUM OXIDE (MAG-OX) 400 MG TABLET    Take 400 mg by mouth daily. 0900   METHOTREXATE (RHEUMATREX) 2.5 MG TABLET    Take 7.5 mg by mouth once a week. Caution:Chemotherapy. Protect from light.   METOPROLOL TARTRATE (LOPRESSOR) 25 MG TABLET    0.5 tablet by mouth two times a day 0900 , 2100   NITROGLYCERIN (NITROSTAT) 0.4 MG SL TABLET    Place 0.4 mg under the tongue every 5 (five) minutes as needed for chest pain.   OMEPRAZOLE (PRILOSEC) 40 MG CAPSULE    Take 40 mg by mouth daily. 0600   POTASSIUM CHLORIDE (KLOR-CON) 20 MEQ PACKET    Take 40 mEq by mouth daily. 0800   PREDNISONE (DELTASONE) 20 MG TABLET    Take 40 mg by mouth daily with breakfast.   ROPINIROLE (REQUIP) 0.5 MG TABLET    Take 0.5 mg by mouth at bedtime. 2100   SENNOSIDES-DOCUSATE SODIUM (SENOKOT-S) 8.6-50 MG TABLET    Take 2 tablets by mouth at bedtime. 2100   TORSEMIDE (DEMADEX) 20 MG TABLET    Take 20 mg by mouth 2 (two) times daily.   TRAMADOL-ACETAMINOPHEN (ULTRACET) 37.5-325 MG TABLET    Take 2  tablets by mouth every 6 (six) hours as needed for moderate pain.   VITAMIN C (ASCORBIC ACID) 500 MG TABLET    Take  500 mg by mouth 2 (two) times daily. 0900 and 2100  Modified Medications   No medications on file  Discontinued Medications   PREDNISONE (DELTASONE) 10 MG TABLET    Take 10 mg by mouth daily at 6 (six) AM.     SIGNIFICANT DIAGNOSTIC EXAMS  10-12-15: chest x-ray: Enlarged cardiac silhouette. Pulmonary vascular congestion.  10-18-15: EEG: This is a normal EEG for the patients stated age.  There were no focal, hemispheric or lateralizing features.  No epileptiform activity was recorded.  A normal EEG does not exclude the diagnosis of a seizure disorder and if seizure remains high on the list of differential diagnosis, an ambulatory EEG may be of value.  Clinical correlation is required.  01-11-16: chest x-ray: Minimal left base atelectasis.  Otherwise, normal exam.  01-11-16: ct of head: No acute intracranial abnormality. Stable mild cerebral atrophy and chronic white matter disease. No definite acute cortical infarction.  01-11-16: scrotal ultrasound: 1. Left epididymo-orchitis. 2. Microlithiasis involving the left testicle. 3. Three adjacent complex cysts/spermatoceles involving the left epididymal head.   LABS REVIEWED:   09-10-15: hgb a1c 10.4 09-15-15; tsh 2.954 10-12-15; wbc 5.9; hgb 11.8; hct 36.1; mcv 93.5; pt 195; glucose 187; bun 8; creat 0.78; k+ 5.0; na++136 11-03-15: wbc 9.2; hgb 12.7; hct 39.7; mcv 89.3; plt 267; glucose 105; bun 17.2; creat 0.61; k+ 4.2; na++135  12-29-15: chol 180; ldl 115; trig 66; hdl 52; hgb a1c 8.8  01-11-16: wbc 19.6; hgb 13.6; hct 40.1; mcv 87.6; plt 232; glucose 255; bun 15; creat 0.67; k+ 4.7; na++132; liver normal albumin 2.9; blood culture: no growth; urine culture: e-coli-esbl; klebsiella pneumoniae  01-15-16: wbc 7.4; hgb 11.6; hct 35.7; mcv 89.9; plt 242; glucose 159; bun 5; creat 0.53; k+ 4.1; na++135  03-16-16: hgb a1c 8.7; urine micro-albumin  28.5 05-09-16: wbc 15.7; hgb 11.6; hct 35.8; mcv 87.8 ;plt 364; glucose 293; bun 21.6; creat 0.75; k+ 4.3; na++ 134       Review of Systems  Constitutional: Negative for malaise/fatigue.  Respiratory: Negative for cough and shortness of breath.   Cardiovascular: Positive for leg swelling. Negative for chest pain and palpitations.  Gastrointestinal: Negative for heartburn, abdominal pain and constipation.  Musculoskeletal: Negative for myalgias, back pain and joint pain.  Skin: Negative.   Neurological: Negative for dizziness.  Psychiatric/Behavioral: The patient is not nervous/anxious.        Physical Exam  Constitutional: He is oriented to person, place, and time. No distress.  Obese   Eyes: Conjunctivae are normal.  Neck: Neck supple. No JVD present. No thyromegaly present.  Cardiovascular: Normal rate, regular rhythm and intact distal pulses.   Respiratory: Effort normal and breath sounds normal. No respiratory distress. He has no wheezes.  GI: Soft. Bowel sounds are normal. He exhibits no distension. There is no tenderness.  Genitourinary:  Has foley   Musculoskeletal: He exhibits edema.  Able to move all extremities Trace bilateral lower extremity edema     Lymphadenopathy:    He has no cervical adenopathy.  Neurological: alert  Skin: Skin is warm and dry. He is not diaphoretic. The rash on his entire is slowly resolving.       ASSESSMENT/ PLAN:  1. Gerd: will continue prilosec 40 mg daily   2. Diastolic heart failure: EF is 55-60%;  will continue demadex 20 mg twice daily with k+ 40 meq daily   3. Hypertension: will continue lopressor 12.5 mg twice daily noravsc 5 mg daily   4. Neurogenic bladder:  has chronic indwelling foley  Will monitor  5. RLS: is stable will continue requip 0.5 mg nightly   6. Diabetes: will continue NPH 40 units twice daily hgb a1c 8.7  is improved form 10.4   7. CAD: no complaint of chest pain; will continue asa 81 mg daily has ntg  prn  8. Allergic rhinitis: is currently off medications; will monitor  9. CVA: is neurologically stable will continue asa 81 mg daily   10. Depression: will continue lexapro 10 mg daily   11. Pruritis: has dry skin will continue atarax 25 mg four times  daily   12. Constipation: will continue senna s 2 tabs daily   13. Peripheral neuropathy: will continue neurontin 100 mg twice daily has ultracet 2 tabs every 6 hours as needed for pain.   14. Bullous pemphigoid: is being followed by dermatology; will continue methotrexate 7.5 mg weekly with folic acid; prednisone 40 mg daily      Ok Edwards NP [facility] Adult [facility]  Contact [phone] Monday through Friday 8am- 5pm  After hours call [phone]

## 2016-05-13 ENCOUNTER — Encounter: Payer: Self-pay | Admitting: Internal Medicine

## 2016-05-15 ENCOUNTER — Non-Acute Institutional Stay (SKILLED_NURSING_FACILITY): Payer: Medicare Other | Admitting: Adult Health

## 2016-05-15 ENCOUNTER — Encounter: Payer: Self-pay | Admitting: Adult Health

## 2016-05-15 DIAGNOSIS — E1165 Type 2 diabetes mellitus with hyperglycemia: Secondary | ICD-10-CM

## 2016-05-15 DIAGNOSIS — E1143 Type 2 diabetes mellitus with diabetic autonomic (poly)neuropathy: Secondary | ICD-10-CM

## 2016-05-15 DIAGNOSIS — M25551 Pain in right hip: Secondary | ICD-10-CM | POA: Diagnosis not present

## 2016-05-15 DIAGNOSIS — S60522A Blister (nonthermal) of left hand, initial encounter: Secondary | ICD-10-CM | POA: Diagnosis not present

## 2016-05-15 DIAGNOSIS — IMO0002 Reserved for concepts with insufficient information to code with codable children: Secondary | ICD-10-CM

## 2016-05-15 NOTE — Progress Notes (Signed)
Patient ID: [name], male   DOB: [birth year], 70 y.o.   MRN: [id number]    Location:   [place] Room Number: 216-B Place of Service:  SNF (31)   CODE STATUS: DNR  Allergies  Allergen Reactions  . Ace Inhibitors Swelling    Pt tolerates lisinopril  . Lipitor [Atorvastatin Calcium] Swelling  . Metformin And Related Swelling  . Cefadroxil Hives  . Cephalexin Hives  . Morphine And Related Other (See Comments)    Sweating, feels like is "in rocky boat."  . Robaxin [Methocarbamol] Other (See Comments)    Feels like he is shaky    Chief Complaint  Patient presents with  . 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 His cbg's remain elevated. We have discussed with diabetic regimen; I have also discussed with Dr. Eulas Post. He does not want to take 4 or 5 injections daily. He willing to try soliqua for his diabetes. He is complaining of some muscle pain in his right posterior thigh. His gums are sore.   Past Medical History:  Diagnosis Date  . Arthritis    "hands, ankles, knees" (11/10/2014)  . Bladder spasms   . CAD (coronary artery disease)   . CAD in native artery 09/15/2015  . CHF (congestive heart failure) ([place])   . Chronic indwelling Foley catheter   . Chronic lower back pain   . Depression   . Diabetic diarrhea ([place])   . Diabetic neuropathy, painful ([place])   . DVT (deep venous thrombosis) ([facility]) 1980's   LLE  . GERD (gastroesophageal reflux disease)   . Hyperlipidemia   . Hypertension   . Neurogenic bladder   . Obese   . OSA (obstructive sleep apnea)    "they wanted me to wear a mask; I couldn't" (11/10/2014)  . Pneumonia 1999  . PONV (postoperative nausea and vomiting)   . Stroke [facility]) 10/2011; 06/2014   right hand numbness/notes 10/20/2011, pt not sure he had stroke in 2013; "right hand weaker and right face not quite right since" (11/10/2014)  . Type II diabetes mellitus ([place])     Past Surgical History:  Procedure Laterality  Date  . CARDIAC CATHETERIZATION N/A 05/02/2015   Procedure: Left Heart Cath and Coronary Angiography;  Surgeon: Lorretta Harp, MD;  Location: [place] CV LAB;  Service: Cardiovascular;  Laterality: N/A;  . CARDIAC CATHETERIZATION N/A 05/04/2015   Procedure: Left Heart Cath and Coronary Angiography;  Surgeon: Wellington Hampshire, MD;  Location: [place] CV LAB;  Service: Cardiovascular;  Laterality: N/A;  . CARPAL TUNNEL RELEASE Bilateral ~ 2003-2004  . CHOLECYSTECTOMY OPEN  1970's?  [facility] Kitchen GASTROPLASTY    . JOINT REPLACEMENT    . LEFT HEART CATHETERIZATION WITH CORONARY ANGIOGRAM N/A 10/24/2011   Procedure: LEFT HEART CATHETERIZATION WITH CORONARY ANGIOGRAM;  Surgeon: Candee Furbish, MD;  Location: [facility] CATH LAB;  Service: Cardiovascular;  Laterality: N/A;  right radial artery approach  . TOTAL KNEE ARTHROPLASTY Right 1990's    Social History   Social History  . Marital status: Widowed    Spouse name: N/A  . Number of children: N/A  . Years of education: N/A   Occupational History  . Retired    Social History Main Topics  . Smoking status: Former Smoker    Packs/day: 2.00    Years: 20.00    Types: Cigarettes    Quit date: 09/10/1976  . Smokeless tobacco: Never Used  . 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 Drug use: No  . Sexual activity: No   Other Topics Concern  . Not on file   Social History Narrative   Lives alone.  No close family nearby.     Family History  Problem Relation Age of Onset  . Diabetes type I Father   . Hypertension Mother   . Cancer Brother      Testicular Cancer  . Cancer Brother     Leukemia      VITAL SIGNS BP 113/62   Pulse 78   Temp 98.1 F (36.7 C) (Oral)   Resp 17   Ht 6\' 1"  (1.854 m)   Wt (!) 347 lb (157.4 kg)   SpO2 99%   BMI 45.78 kg/m   Patient's Medications  New Prescriptions   No medications on file  Previous Medications   ALUMINUM-MAGNESIUM HYDROXIDE-SIMETHICONE (MAALOX) [id number]  MG/5ML SUSP    Take 30 mLs by mouth every 6 (six) hours as needed.   AMINO ACIDS-PROTEIN HYDROLYS (FEEDING SUPPLEMENT, PRO-STAT SUGAR FREE 64,) LIQD    Take 60 mLs by mouth 3 (three) times daily with meals.   AMLODIPINE (NORVASC) 5 MG TABLET    Take 5 mg by mouth daily.   ASPIRIN EC 81 MG TABLET    Take 81 mg by mouth daily. 0900   DOXYCYCLINE (VIBRAMYCIN) 100 MG CAPSULE    Take 1 capsule (100 mg total) by mouth 2 (two) times daily.   ESCITALOPRAM (LEXAPRO) 10 MG TABLET    Take 10 mg by mouth daily.    FOLIC ACID (FOLVITE) 1 MG TABLET    Take 1 mg by mouth daily.   GABAPENTIN (NEURONTIN) 100 MG CAPSULE    Take 1 capsule (100 mg total) by mouth 2 (two) times daily.   HYDROXYZINE (ATARAX/VISTARIL) 25 MG TABLET    Take 25 mg by mouth 4 (four) times daily.    INSULIN NPH HUMAN (HUMULIN N,NOVOLIN N) 100 UNIT/ML INJECTION    Inject 40 Units into the skin 2 (two) times daily. 800 am and 10pm    MAGNESIUM OXIDE (MAG-OX) 400 MG TABLET    Take 400 mg by mouth daily. 0900   METHOTREXATE (RHEUMATREX) 2.5 MG TABLET    Take 7.5 mg by mouth once a week. Caution:Chemotherapy. Protect from light.   METOPROLOL TARTRATE (LOPRESSOR) 25 MG TABLET    0.5 tablet by mouth two times a day 0900 , 2100   NITROGLYCERIN (NITROSTAT) 0.4 MG SL TABLET    Place 0.4 mg under the tongue every 5 (five) minutes as needed for chest pain.   OMEPRAZOLE (PRILOSEC) 40 MG CAPSULE    Take 40 mg by mouth daily. 0600   POTASSIUM CHLORIDE (KLOR-CON) 20 MEQ PACKET    Take 40 mEq by mouth daily. 0800   PREDNISONE (DELTASONE) 20 MG TABLET    Take 40 mg by mouth daily with breakfast.   ROPINIROLE (REQUIP) 0.5 MG TABLET    Take 0.5 mg by mouth at bedtime. 2100   SENNOSIDES-DOCUSATE SODIUM (SENOKOT-S) 8.6-50 MG TABLET    Take 2 tablets by mouth at bedtime. 2100   TORSEMIDE (DEMADEX) 20 MG TABLET    Take 20 mg by mouth 2 (two) times daily.   TRAMADOL-ACETAMINOPHEN (ULTRACET) 37.5-325 MG TABLET    Take 2 tablets by mouth every 6 (six) hours as needed  for moderate pain.   VITAMIN C (ASCORBIC ACID) 500 MG TABLET    Take 500 mg  by mouth 2 (two) times daily. 0900 and 2100  Modified Medications   No medications on file  Discontinued Medications   No medications on file     SIGNIFICANT DIAGNOSTIC EXAMS  10-12-15: chest x-ray: Enlarged cardiac silhouette. Pulmonary vascular congestion.  10-18-15: EEG: This is a normal EEG for the patients stated age.  There were no focal, hemispheric or lateralizing features.  No epileptiform activity was recorded.  A normal EEG does not exclude the diagnosis of a seizure disorder and if seizure remains high on the list of differential diagnosis, an ambulatory EEG may be of value.  Clinical correlation is required.  01-11-16: chest x-ray: Minimal left base atelectasis.  Otherwise, normal exam.  01-11-16: ct of head: No acute intracranial abnormality. Stable mild cerebral atrophy and chronic white matter disease. No definite acute cortical infarction.  01-11-16: scrotal ultrasound: 1. Left epididymo-orchitis. 2. Microlithiasis involving the left testicle. 3. Three adjacent complex cysts/spermatoceles involving the left epididymal head.   LABS REVIEWED:   09-10-15: hgb a1c 10.4 09-15-15; tsh 2.954 10-12-15; wbc 5.9; hgb 11.8; hct 36.1; mcv 93.5; pt 195; glucose 187; bun 8; creat 0.78; k+ 5.0; na++136 11-03-15: wbc 9.2; hgb 12.7; hct 39.7; mcv 89.3; plt 267; glucose 105; bun 17.2; creat 0.61; k+ 4.2; na++135  12-29-15: chol 180; ldl 115; trig 66; hdl 52; hgb a1c 8.8  01-11-16: wbc 19.6; hgb 13.6; hct 40.1; mcv 87.6; plt 232; glucose 255; bun 15; creat 0.67; k+ 4.7; na++132; liver normal albumin 2.9; blood culture: no growth; urine culture: e-coli-esbl; klebsiella pneumoniae  01-15-16: wbc 7.4; hgb 11.6; hct 35.7; mcv 89.9; plt 242; glucose 159; bun 5; creat 0.53; k+ 4.1; na++135       Review of Systems  Constitutional: Negative for malaise/fatigue.  Respiratory: Negative for cough and shortness of breath.     Cardiovascular: Positive for leg swelling. Negative for chest pain and palpitations.  Gastrointestinal: Negative for heartburn, abdominal pain and constipation.  Musculoskeletal: right thigh pain   Skin: sores are improving.   Neurological: Negative for dizziness.  Psychiatric/Behavioral: The patient is not nervous/anxious.        Physical Exam  Constitutional: He is oriented to person, place, and time. No distress.  Obese   Eyes: Conjunctivae are normal.  Neck: Neck supple. No JVD present. No thyromegaly present.  Cardiovascular: Normal rate, regular rhythm and intact distal pulses.   Respiratory: Effort normal and breath sounds normal. No respiratory distress. He has no wheezes.  GI: Soft. Bowel sounds are normal. He exhibits no distension. There is no tenderness.  Genitourinary:  Has foley   Musculoskeletal: He exhibits edema.  Able to move all extremities Trace bilateral lower extremity edema     Lymphadenopathy:    He has no cervical adenopathy.  Neurological: alert  Skin: Skin is warm and dry. He is not diaphoretic. His skin is improving       ASSESSMENT/ PLAN:  1. Diabetes: will stop NPH will begin soliqua 100/33 30 units nightly will continue to monitor his status  2. Right hip and thigh pain: will begin aleve twice daily    Will place on dental list for his sore gums; will use orajel as needed  Will check ANA    MD is aware of resident's narcotic use and is in agreement with current plan of care. We will attempt to wean resident as apropriate   Ok Edwards NP [facility]  Contact [phone] Monday through Friday 8am- 5pm  After hours call [phone]

## 2016-05-16 DIAGNOSIS — L120 Bullous pemphigoid: Secondary | ICD-10-CM | POA: Diagnosis not present

## 2016-05-17 DIAGNOSIS — Z794 Long term (current) use of insulin: Secondary | ICD-10-CM | POA: Diagnosis not present

## 2016-05-17 DIAGNOSIS — H2513 Age-related nuclear cataract, bilateral: Secondary | ICD-10-CM | POA: Diagnosis not present

## 2016-05-17 DIAGNOSIS — E119 Type 2 diabetes mellitus without complications: Secondary | ICD-10-CM | POA: Diagnosis not present

## 2016-05-27 ENCOUNTER — Inpatient Hospital Stay (HOSPITAL_COMMUNITY)
Admission: EM | Admit: 2016-05-27 | Discharge: 2016-06-02 | DRG: 871 | Disposition: A | Discharge status: Skilled Nursing Facility | Payer: Medicare Other | Attending: Internal Medicine | Admitting: Internal Medicine

## 2016-05-27 ENCOUNTER — Emergency Department (HOSPITAL_COMMUNITY): Payer: Medicare Other

## 2016-05-27 ENCOUNTER — Encounter (HOSPITAL_COMMUNITY): Payer: Self-pay | Admitting: Emergency Medicine

## 2016-05-27 DIAGNOSIS — I2510 Atherosclerotic heart disease of native coronary artery without angina pectoris: Secondary | ICD-10-CM | POA: Diagnosis not present

## 2016-05-27 DIAGNOSIS — Z86718 Personal history of other venous thrombosis and embolism: Secondary | ICD-10-CM

## 2016-05-27 DIAGNOSIS — E860 Dehydration: Secondary | ICD-10-CM | POA: Diagnosis present

## 2016-05-27 DIAGNOSIS — L8990 Pressure ulcer of unspecified site, unspecified stage: Secondary | ICD-10-CM | POA: Diagnosis present

## 2016-05-27 DIAGNOSIS — Z7401 Bed confinement status: Secondary | ICD-10-CM

## 2016-05-27 DIAGNOSIS — J690 Pneumonitis due to inhalation of food and vomit: Secondary | ICD-10-CM | POA: Diagnosis not present

## 2016-05-27 DIAGNOSIS — G4733 Obstructive sleep apnea (adult) (pediatric): Secondary | ICD-10-CM | POA: Diagnosis present

## 2016-05-27 DIAGNOSIS — E0800 Diabetes mellitus due to underlying condition with hyperosmolarity without nonketotic hyperglycemic-hyperosmolar coma (NKHHC): Secondary | ICD-10-CM | POA: Diagnosis not present

## 2016-05-27 DIAGNOSIS — I110 Hypertensive heart disease with heart failure: Secondary | ICD-10-CM | POA: Diagnosis not present

## 2016-05-27 DIAGNOSIS — E1165 Type 2 diabetes mellitus with hyperglycemia: Secondary | ICD-10-CM | POA: Diagnosis present

## 2016-05-27 DIAGNOSIS — G8929 Other chronic pain: Secondary | ICD-10-CM | POA: Diagnosis present

## 2016-05-27 DIAGNOSIS — I82891 Chronic embolism and thrombosis of other specified veins: Secondary | ICD-10-CM | POA: Diagnosis not present

## 2016-05-27 DIAGNOSIS — E872 Acidosis, unspecified: Secondary | ICD-10-CM

## 2016-05-27 DIAGNOSIS — Z7952 Long term (current) use of systemic steroids: Secondary | ICD-10-CM | POA: Diagnosis not present

## 2016-05-27 DIAGNOSIS — Z960 Presence of urogenital implants: Secondary | ICD-10-CM

## 2016-05-27 DIAGNOSIS — Z96651 Presence of right artificial knee joint: Secondary | ICD-10-CM | POA: Diagnosis present

## 2016-05-27 DIAGNOSIS — E662 Morbid (severe) obesity with alveolar hypoventilation: Secondary | ICD-10-CM | POA: Diagnosis present

## 2016-05-27 DIAGNOSIS — R0602 Shortness of breath: Secondary | ICD-10-CM

## 2016-05-27 DIAGNOSIS — A419 Sepsis, unspecified organism: Secondary | ICD-10-CM | POA: Diagnosis not present

## 2016-05-27 DIAGNOSIS — I69351 Hemiplegia and hemiparesis following cerebral infarction affecting right dominant side: Secondary | ICD-10-CM

## 2016-05-27 DIAGNOSIS — E1100 Type 2 diabetes mellitus with hyperosmolarity without nonketotic hyperglycemic-hyperosmolar coma (NKHHC): Secondary | ICD-10-CM | POA: Diagnosis present

## 2016-05-27 DIAGNOSIS — M545 Low back pain: Secondary | ICD-10-CM | POA: Diagnosis present

## 2016-05-27 DIAGNOSIS — L109 Pemphigus, unspecified: Secondary | ICD-10-CM | POA: Diagnosis present

## 2016-05-27 DIAGNOSIS — Z79899 Other long term (current) drug therapy: Secondary | ICD-10-CM

## 2016-05-27 DIAGNOSIS — A4902 Methicillin resistant Staphylococcus aureus infection, unspecified site: Secondary | ICD-10-CM | POA: Diagnosis not present

## 2016-05-27 DIAGNOSIS — I350 Nonrheumatic aortic (valve) stenosis: Secondary | ICD-10-CM | POA: Diagnosis present

## 2016-05-27 DIAGNOSIS — A4102 Sepsis due to Methicillin resistant Staphylococcus aureus: Secondary | ICD-10-CM | POA: Diagnosis not present

## 2016-05-27 DIAGNOSIS — M6281 Muscle weakness (generalized): Secondary | ICD-10-CM | POA: Diagnosis not present

## 2016-05-27 DIAGNOSIS — I10 Essential (primary) hypertension: Secondary | ICD-10-CM | POA: Diagnosis not present

## 2016-05-27 DIAGNOSIS — N319 Neuromuscular dysfunction of bladder, unspecified: Secondary | ICD-10-CM | POA: Diagnosis present

## 2016-05-27 DIAGNOSIS — R55 Syncope and collapse: Secondary | ICD-10-CM | POA: Diagnosis not present

## 2016-05-27 DIAGNOSIS — R6520 Severe sepsis without septic shock: Secondary | ICD-10-CM | POA: Diagnosis present

## 2016-05-27 DIAGNOSIS — Z794 Long term (current) use of insulin: Secondary | ICD-10-CM

## 2016-05-27 DIAGNOSIS — L89319 Pressure ulcer of right buttock, unspecified stage: Secondary | ICD-10-CM | POA: Diagnosis present

## 2016-05-27 DIAGNOSIS — J189 Pneumonia, unspecified organism: Secondary | ICD-10-CM | POA: Diagnosis not present

## 2016-05-27 DIAGNOSIS — IMO0002 Reserved for concepts with insufficient information to code with codable children: Secondary | ICD-10-CM

## 2016-05-27 DIAGNOSIS — L03113 Cellulitis of right upper limb: Secondary | ICD-10-CM | POA: Diagnosis not present

## 2016-05-27 DIAGNOSIS — R0789 Other chest pain: Secondary | ICD-10-CM | POA: Diagnosis not present

## 2016-05-27 DIAGNOSIS — N390 Urinary tract infection, site not specified: Secondary | ICD-10-CM

## 2016-05-27 DIAGNOSIS — E785 Hyperlipidemia, unspecified: Secondary | ICD-10-CM | POA: Diagnosis present

## 2016-05-27 DIAGNOSIS — E871 Hypo-osmolality and hyponatremia: Secondary | ICD-10-CM | POA: Diagnosis present

## 2016-05-27 DIAGNOSIS — K219 Gastro-esophageal reflux disease without esophagitis: Secondary | ICD-10-CM | POA: Diagnosis present

## 2016-05-27 DIAGNOSIS — B9562 Methicillin resistant Staphylococcus aureus infection as the cause of diseases classified elsewhere: Secondary | ICD-10-CM | POA: Diagnosis not present

## 2016-05-27 DIAGNOSIS — R079 Chest pain, unspecified: Secondary | ICD-10-CM | POA: Diagnosis not present

## 2016-05-27 DIAGNOSIS — E118 Type 2 diabetes mellitus with unspecified complications: Secondary | ICD-10-CM

## 2016-05-27 DIAGNOSIS — I5033 Acute on chronic diastolic (congestive) heart failure: Secondary | ICD-10-CM | POA: Diagnosis not present

## 2016-05-27 DIAGNOSIS — G2581 Restless legs syndrome: Secondary | ICD-10-CM | POA: Diagnosis not present

## 2016-05-27 DIAGNOSIS — I5030 Unspecified diastolic (congestive) heart failure: Secondary | ICD-10-CM | POA: Diagnosis not present

## 2016-05-27 DIAGNOSIS — Y95 Nosocomial condition: Secondary | ICD-10-CM | POA: Diagnosis present

## 2016-05-27 DIAGNOSIS — E1140 Type 2 diabetes mellitus with diabetic neuropathy, unspecified: Secondary | ICD-10-CM | POA: Diagnosis present

## 2016-05-27 DIAGNOSIS — E1169 Type 2 diabetes mellitus with other specified complication: Secondary | ICD-10-CM | POA: Diagnosis not present

## 2016-05-27 DIAGNOSIS — R0781 Pleurodynia: Secondary | ICD-10-CM | POA: Diagnosis not present

## 2016-05-27 DIAGNOSIS — Z6841 Body Mass Index (BMI) 40.0 and over, adult: Secondary | ICD-10-CM | POA: Diagnosis not present

## 2016-05-27 DIAGNOSIS — Z9289 Personal history of other medical treatment: Secondary | ICD-10-CM

## 2016-05-27 DIAGNOSIS — I5032 Chronic diastolic (congestive) heart failure: Secondary | ICD-10-CM | POA: Diagnosis not present

## 2016-05-27 DIAGNOSIS — Z8673 Personal history of transient ischemic attack (TIA), and cerebral infarction without residual deficits: Secondary | ICD-10-CM

## 2016-05-27 DIAGNOSIS — Z978 Presence of other specified devices: Secondary | ICD-10-CM

## 2016-05-27 DIAGNOSIS — R599 Enlarged lymph nodes, unspecified: Secondary | ICD-10-CM

## 2016-05-27 DIAGNOSIS — F329 Major depressive disorder, single episode, unspecified: Secondary | ICD-10-CM | POA: Diagnosis present

## 2016-05-27 DIAGNOSIS — J90 Pleural effusion, not elsewhere classified: Secondary | ICD-10-CM | POA: Diagnosis not present

## 2016-05-27 DIAGNOSIS — R7989 Other specified abnormal findings of blood chemistry: Secondary | ICD-10-CM | POA: Diagnosis not present

## 2016-05-27 DIAGNOSIS — Z7984 Long term (current) use of oral hypoglycemic drugs: Secondary | ICD-10-CM | POA: Diagnosis not present

## 2016-05-27 DIAGNOSIS — L89329 Pressure ulcer of left buttock, unspecified stage: Secondary | ICD-10-CM | POA: Diagnosis present

## 2016-05-27 DIAGNOSIS — L03119 Cellulitis of unspecified part of limb: Secondary | ICD-10-CM | POA: Diagnosis present

## 2016-05-27 DIAGNOSIS — J984 Other disorders of lung: Secondary | ICD-10-CM

## 2016-05-27 DIAGNOSIS — E119 Type 2 diabetes mellitus without complications: Secondary | ICD-10-CM | POA: Diagnosis not present

## 2016-05-27 DIAGNOSIS — E08628 Diabetes mellitus due to underlying condition with other skin complications: Secondary | ICD-10-CM | POA: Diagnosis not present

## 2016-05-27 DIAGNOSIS — Z87891 Personal history of nicotine dependence: Secondary | ICD-10-CM

## 2016-05-27 DIAGNOSIS — L120 Bullous pemphigoid: Secondary | ICD-10-CM | POA: Diagnosis not present

## 2016-05-27 DIAGNOSIS — M25512 Pain in left shoulder: Secondary | ICD-10-CM | POA: Diagnosis not present

## 2016-05-27 DIAGNOSIS — Z7982 Long term (current) use of aspirin: Secondary | ICD-10-CM

## 2016-05-27 DIAGNOSIS — G4700 Insomnia, unspecified: Secondary | ICD-10-CM | POA: Diagnosis not present

## 2016-05-27 DIAGNOSIS — R7881 Bacteremia: Secondary | ICD-10-CM

## 2016-05-27 DIAGNOSIS — Z9049 Acquired absence of other specified parts of digestive tract: Secondary | ICD-10-CM

## 2016-05-27 DIAGNOSIS — Z9989 Dependence on other enabling machines and devices: Secondary | ICD-10-CM

## 2016-05-27 LAB — BASIC METABOLIC PANEL
Anion gap: 14 (ref 5–15)
BUN: 21 mg/dL — ABNORMAL HIGH (ref 6–20)
CALCIUM: 8.8 mg/dL — AB (ref 8.9–10.3)
CO2: 26 mmol/L (ref 22–32)
CREATININE: 1.12 mg/dL (ref 0.61–1.24)
Chloride: 87 mmol/L — ABNORMAL LOW (ref 101–111)
GFR calc Af Amer: >60 mL/min (ref >60)
GLUCOSE: 587 mg/dL — AB (ref 65–99)
Potassium: 4.9 mmol/L (ref 3.5–5.1)
Sodium: 127 mmol/L — ABNORMAL LOW (ref 135–145)

## 2016-05-27 LAB — CBC
HCT: 40.1 % (ref 39.0–52.0)
Hemoglobin: 13.6 g/dL (ref 13.0–17.0)
MCH: 30.2 pg (ref 26.0–34.0)
MCHC: 33.9 g/dL (ref 30.0–36.0)
MCV: 88.9 fL (ref 78.0–100.0)
PLATELETS: 227 10*3/uL (ref 150–400)
RBC: 4.51 MIL/uL (ref 4.22–5.81)
RDW: 12.9 % (ref 11.5–15.5)
WBC: 29.3 10*3/uL — ABNORMAL HIGH (ref 4.0–10.5)

## 2016-05-27 LAB — PROTIME-INR
INR: 1.22
Prothrombin Time: 15.5 seconds — ABNORMAL HIGH (ref 11.4–15.2)

## 2016-05-27 LAB — D-DIMER, QUANTITATIVE (NOT AT ARMC): D DIMER QUANT: 0.38 ug{FEU}/mL (ref 0.00–0.50)

## 2016-05-27 LAB — HEPATIC FUNCTION PANEL
ALT: 20 U/L (ref 17–63)
AST: 15 U/L (ref 15–41)
Albumin: 2.8 g/dL — ABNORMAL LOW (ref 3.5–5.0)
Alkaline Phosphatase: 138 U/L — ABNORMAL HIGH (ref 38–126)
BILIRUBIN TOTAL: 0.8 mg/dL (ref 0.3–1.2)
Bilirubin, Direct: 0.2 mg/dL (ref 0.1–0.5)
Indirect Bilirubin: 0.6 mg/dL (ref 0.3–0.9)
Total Protein: 7.4 g/dL (ref 6.5–8.1)

## 2016-05-27 LAB — URINALYSIS, ROUTINE W REFLEX MICROSCOPIC
BILIRUBIN URINE: NEGATIVE
Glucose, UA: >1000 mg/dL — AB
Ketones, ur: 15 mg/dL — AB
NITRITE: POSITIVE — AB
PH: 5.5 (ref 5.0–8.0)
Protein, ur: 30 mg/dL — AB
SPECIFIC GRAVITY, URINE: 1.03 (ref 1.005–1.030)

## 2016-05-27 LAB — I-STAT CG4 LACTIC ACID, ED: Lactic Acid, Venous: 2.44 mmol/L (ref 0.5–1.9)

## 2016-05-27 LAB — I-STAT TROPONIN, ED: TROPONIN I, POC: 0 ng/mL (ref 0.00–0.08)

## 2016-05-27 LAB — TROPONIN I: TROPONIN I: 0.11 ng/mL — AB (ref <0.03)

## 2016-05-27 LAB — I-STAT VENOUS BLOOD GAS, ED
ACID-BASE EXCESS: 2 mmol/L (ref 0.0–2.0)
BICARBONATE: 29.3 mmol/L — AB (ref 20.0–28.0)
O2 SAT: 33 %
PO2 VEN: 21 mmHg — AB (ref 32.0–45.0)
TCO2: 31 mmol/L (ref 0–100)
pCO2, Ven: 51.8 mmHg (ref 44.0–60.0)
pH, Ven: 7.36 (ref 7.250–7.430)

## 2016-05-27 LAB — GLUCOSE, CAPILLARY
GLUCOSE-CAPILLARY: 471 mg/dL — AB (ref 65–99)
GLUCOSE-CAPILLARY: 381 mg/dL — AB (ref 65–99)
GLUCOSE-CAPILLARY: 382 mg/dL — AB (ref 65–99)
GLUCOSE-CAPILLARY: 257 mg/dL — AB (ref 65–99)
Glucose-Capillary: 388 mg/dL — ABNORMAL HIGH (ref 65–99)
Glucose-Capillary: 489 mg/dL — ABNORMAL HIGH (ref 65–99)
Glucose-Capillary: 271 mg/dL — ABNORMAL HIGH (ref 65–99)
Glucose-Capillary: 220 mg/dL — ABNORMAL HIGH (ref 65–99)
Glucose-Capillary: 185 mg/dL — ABNORMAL HIGH (ref 65–99)

## 2016-05-27 LAB — URINE MICROSCOPIC-ADD ON

## 2016-05-27 LAB — CORTISOL: Cortisol, Plasma: 40.5 ug/dL

## 2016-05-27 LAB — LACTIC ACID, PLASMA
LACTIC ACID, VENOUS: 1.9 mmol/L (ref 0.5–1.9)
Lactic Acid, Venous: 2.7 mmol/L (ref 0.5–1.9)

## 2016-05-27 LAB — BRAIN NATRIURETIC PEPTIDE: B Natriuretic Peptide: 141.6 pg/mL — ABNORMAL HIGH (ref 0.0–100.0)

## 2016-05-27 LAB — APTT: APTT: 29 s (ref 24–36)

## 2016-05-27 LAB — MRSA PCR SCREENING: MRSA BY PCR: POSITIVE — AB

## 2016-05-27 LAB — CBG MONITORING, ED
GLUCOSE-CAPILLARY: 464 mg/dL — AB (ref 65–99)
Glucose-Capillary: 480 mg/dL — ABNORMAL HIGH (ref 65–99)

## 2016-05-27 LAB — PROCALCITONIN: PROCALCITONIN: 0.41 ng/mL

## 2016-05-27 MED ORDER — VANCOMYCIN HCL 10 G IV SOLR
1500.0000 mg | Freq: Two times a day (BID) | INTRAVENOUS | Status: DC
  Administered 2016-05-27 – 2016-05-29 (×5): 1500 mg via INTRAVENOUS
  Filled 2016-05-27 (×9): qty 1500

## 2016-05-27 MED ORDER — TRAMADOL-ACETAMINOPHEN 37.5-325 MG PO TABS
2.0000 | ORAL_TABLET | Freq: Four times a day (QID) | ORAL | Status: DC | PRN
  Administered 2016-05-27 – 2016-06-01 (×7): 2 via ORAL
  Filled 2016-05-27 (×7): qty 2

## 2016-05-27 MED ORDER — SODIUM CHLORIDE 0.9 % IV SOLN
INTRAVENOUS | Status: DC
  Administered 2016-05-27: 14:00:00 via INTRAVENOUS

## 2016-05-27 MED ORDER — TRAZODONE HCL 50 MG PO TABS
25.0000 mg | ORAL_TABLET | Freq: Every day | ORAL | Status: DC
  Administered 2016-05-27 – 2016-06-01 (×5): 25 mg via ORAL
  Filled 2016-05-27 (×5): qty 1

## 2016-05-27 MED ORDER — FENTANYL CITRATE (PF) 100 MCG/2ML IJ SOLN
25.0000 ug | Freq: Once | INTRAMUSCULAR | Status: AC
  Administered 2016-05-27: 25 ug via INTRAVENOUS
  Filled 2016-05-27: qty 2

## 2016-05-27 MED ORDER — HYDROCORTISONE NA SUCCINATE PF 100 MG IJ SOLR
50.0000 mg | Freq: Three times a day (TID) | INTRAMUSCULAR | Status: DC
  Administered 2016-05-27 – 2016-05-28 (×4): 50 mg via INTRAVENOUS
  Filled 2016-05-27 (×4): qty 2

## 2016-05-27 MED ORDER — INSULIN ASPART 100 UNIT/ML ~~LOC~~ SOLN
0.0000 [IU] | SUBCUTANEOUS | Status: DC
  Administered 2016-05-27 (×2): 25 [IU] via SUBCUTANEOUS
  Filled 2016-05-27 (×2): qty 1

## 2016-05-27 MED ORDER — PIPERACILLIN-TAZOBACTAM 3.375 G IVPB
3.3750 g | Freq: Three times a day (TID) | INTRAVENOUS | Status: DC
  Administered 2016-05-27 – 2016-05-28 (×3): 3.375 g via INTRAVENOUS
  Filled 2016-05-27 (×5): qty 50

## 2016-05-27 MED ORDER — MUPIROCIN 2 % EX OINT
1.0000 "application " | TOPICAL_OINTMENT | Freq: Two times a day (BID) | CUTANEOUS | Status: AC
  Administered 2016-05-27 – 2016-06-01 (×10): 1 via NASAL
  Filled 2016-05-27: qty 22

## 2016-05-27 MED ORDER — PIPERACILLIN-TAZOBACTAM 3.375 G IVPB 30 MIN
3.3750 g | INTRAVENOUS | Status: AC
  Administered 2016-05-27: 3.375 g via INTRAVENOUS
  Filled 2016-05-27: qty 50

## 2016-05-27 MED ORDER — ACETAMINOPHEN 325 MG PO TABS: 650.0000 mg | ORAL_TABLET | ORAL | Status: DC | PRN

## 2016-05-27 MED ORDER — DEXTROSE 50 % IV SOLN: 25.0000 mL | INTRAVENOUS | Status: DC | PRN

## 2016-05-27 MED ORDER — SODIUM CHLORIDE 0.9 % IV BOLUS (SEPSIS)
1000.0000 mL | Freq: Once | INTRAVENOUS | Status: AC
  Administered 2016-05-27: 1000 mL via INTRAVENOUS

## 2016-05-27 MED ORDER — ENOXAPARIN SODIUM 40 MG/0.4ML ~~LOC~~ SOLN
40.0000 mg | SUBCUTANEOUS | Status: DC
  Administered 2016-05-27 – 2016-06-01 (×6): 40 mg via SUBCUTANEOUS
  Filled 2016-05-27 (×6): qty 0.4

## 2016-05-27 MED ORDER — HYDROXYZINE HCL 25 MG PO TABS
25.0000 mg | ORAL_TABLET | Freq: Four times a day (QID) | ORAL | Status: DC
  Administered 2016-05-27 – 2016-06-02 (×24): 25 mg via ORAL
  Filled 2016-05-27 (×24): qty 1

## 2016-05-27 MED ORDER — MAGNESIUM OXIDE 400 (241.3 MG) MG PO TABS
400.0000 mg | ORAL_TABLET | Freq: Every day | ORAL | Status: DC
  Administered 2016-05-27 – 2016-06-02 (×7): 400 mg via ORAL
  Filled 2016-05-27 (×7): qty 1

## 2016-05-27 MED ORDER — LACTATED RINGERS IV BOLUS (SEPSIS)
1000.0000 mL | Freq: Once | INTRAVENOUS | Status: AC
  Administered 2016-05-27: 1000 mL via INTRAVENOUS

## 2016-05-27 MED ORDER — HYDROCORTISONE NA SUCCINATE PF 100 MG IJ SOLR
100.0000 mg | Freq: Once | INTRAMUSCULAR | Status: AC
  Administered 2016-05-27: 100 mg via INTRAVENOUS
  Filled 2016-05-27: qty 2

## 2016-05-27 MED ORDER — SENNOSIDES-DOCUSATE SODIUM 8.6-50 MG PO TABS
2.0000 | ORAL_TABLET | Freq: Every day | ORAL | Status: DC
  Administered 2016-05-27 – 2016-06-01 (×5): 2 via ORAL
  Filled 2016-05-27 (×5): qty 2

## 2016-05-27 MED ORDER — SODIUM CHLORIDE 0.9 % IV SOLN
INTRAVENOUS | Status: DC
  Administered 2016-05-27: 3.2 [IU]/h via INTRAVENOUS
  Filled 2016-05-27: qty 2.5

## 2016-05-27 MED ORDER — DEXTROSE-NACL 5-0.45 % IV SOLN
INTRAVENOUS | Status: DC
  Administered 2016-05-27: 22:00:00 via INTRAVENOUS

## 2016-05-27 MED ORDER — VANCOMYCIN HCL 10 G IV SOLR
2000.0000 mg | Freq: Once | INTRAVENOUS | Status: AC
  Administered 2016-05-27: 2000 mg via INTRAVENOUS
  Filled 2016-05-27: qty 2000

## 2016-05-27 MED ORDER — GABAPENTIN 100 MG PO CAPS
100.0000 mg | ORAL_CAPSULE | Freq: Two times a day (BID) | ORAL | Status: DC
  Administered 2016-05-27 – 2016-06-02 (×13): 100 mg via ORAL
  Filled 2016-05-27 (×13): qty 1

## 2016-05-27 MED ORDER — FOLIC ACID 1 MG PO TABS
1.0000 mg | ORAL_TABLET | Freq: Every day | ORAL | Status: DC
  Administered 2016-05-27 – 2016-06-02 (×7): 1 mg via ORAL
  Filled 2016-05-27 (×7): qty 1

## 2016-05-27 MED ORDER — SODIUM CHLORIDE 0.9 % IV SOLN
INTRAVENOUS | Status: DC
  Administered 2016-05-27 – 2016-05-28 (×2): via INTRAVENOUS

## 2016-05-27 MED ORDER — ASPIRIN EC 81 MG PO TBEC
81.0000 mg | DELAYED_RELEASE_TABLET | Freq: Every day | ORAL | Status: DC
  Administered 2016-05-27 – 2016-06-02 (×7): 81 mg via ORAL
  Filled 2016-05-27 (×7): qty 1

## 2016-05-27 MED ORDER — INSULIN REGULAR BOLUS VIA INFUSION
0.0000 [IU] | Freq: Three times a day (TID) | INTRAVENOUS | Status: DC
  Filled 2016-05-27: qty 10

## 2016-05-27 MED ORDER — ROPINIROLE HCL 1 MG PO TABS
0.5000 mg | ORAL_TABLET | Freq: Every day | ORAL | Status: DC
  Administered 2016-05-27 – 2016-06-01 (×6): 0.5 mg via ORAL
  Filled 2016-05-27 (×6): qty 1

## 2016-05-27 MED ORDER — INSULIN GLARGINE 100 UNIT/ML ~~LOC~~ SOLN
10.0000 [IU] | Freq: Two times a day (BID) | SUBCUTANEOUS | Status: DC
  Administered 2016-05-27: 10 [IU] via SUBCUTANEOUS
  Filled 2016-05-27 (×2): qty 0.1

## 2016-05-27 MED ORDER — ESCITALOPRAM OXALATE 10 MG PO TABS
10.0000 mg | ORAL_TABLET | Freq: Every day | ORAL | Status: DC
  Administered 2016-05-27 – 2016-06-02 (×7): 10 mg via ORAL
  Filled 2016-05-27 (×7): qty 1

## 2016-05-27 MED ORDER — CHLORHEXIDINE GLUCONATE CLOTH 2 % EX PADS
6.0000 | MEDICATED_PAD | Freq: Every day | CUTANEOUS | Status: AC
  Administered 2016-05-28 – 2016-06-01 (×5): 6 via TOPICAL

## 2016-05-27 MED ORDER — ONDANSETRON HCL 4 MG/2ML IJ SOLN
4.0000 mg | Freq: Four times a day (QID) | INTRAMUSCULAR | Status: DC | PRN
  Administered 2016-05-29 – 2016-05-30 (×2): 4 mg via INTRAVENOUS
  Filled 2016-05-27 (×2): qty 2

## 2016-05-27 NOTE — ED Provider Notes (Signed)
[facility] DEPT Provider Note   CSN: [id number] Arrival date & time: 05/27/16  3662     History   Chief Complaint Chief Complaint  Patient presents with  . Chest Pain    HPI [name] is a 70 y.o. male.  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 The patient states that over the last several days he has had generalized fatigue, which he describes as a sensation as having no energy. He also had some nausea and vomiting yesterday and has had little to no by mouth intake. He states that he awoke today feeling acutely short of breath and then developed a dull aching right-sided substernal chest pressure. This chest pressure is not positional and he denies any known alleviating or aggravating factors. He does endorse intermittent chest pressure that relieves with nitroglycerin. He was evaluated by EMS this morning and given nitroglycerin which did not improve his symptoms. He also was given an aspirin. Currently he states he feels generally unwell. Denies any abdominal pain, nausea or vomiting. Denies any recent sick contacts, but he is in a facility. Of note, he does state that his wounds from pemphigus to seem to be more red than usual.  Past Medical History:  Diagnosis Date  . Arthritis    "hands, ankles, knees" (11/10/2014)  . Bladder spasms   . CAD (coronary artery disease)   . CAD in native artery 09/15/2015  . CHF (congestive heart failure) ([place])   . Chronic indwelling Foley catheter   . Chronic lower back pain   . Depression   . Diabetic diarrhea ([place])   . Diabetic neuropathy, painful ([place])   . DVT (deep venous thrombosis) ([facility]) 1980's   LLE  . GERD (gastroesophageal reflux disease)   . Hyperlipidemia   . Hypertension   . Neurogenic bladder   . Obese   . OSA (obstructive  sleep apnea)    "they wanted me to wear a mask; I couldn't" (11/10/2014)  . Pneumonia 1999  . PONV (postoperative nausea and vomiting)   . Stroke [facility]) 10/2011; 06/2014   right hand numbness/notes 10/20/2011, pt not sure he had stroke in 2013; "right hand weaker and right face not quite right since" (11/10/2014)  . Type II diabetes mellitus [facility])     Patient Active Problem List   Diagnosis Date Noted  . 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 OSA on CPAP 05/27/2016  . Chest pain 05/27/2016  . Sepsis, unspecified organism ([place])   . Hypertensive heart disease with CHF (congestive heart failure) ([place]) 04/15/2016  . Infection due to ESBL-producing Escherichia coli 01/22/2016  . Klebsiella cystitis 01/22/2016  . H/O: CVA (cerebrovascular accident) 01/22/2016  . Altered mental status   . Acute encephalopathy 01/11/2016  . Sepsis ([place]) 01/11/2016  . UTI (lower urinary tract infection) 01/11/2016  . Epididymo-orchitis 01/11/2016  . Depression   . Rash and nonspecific skin eruption 11/07/2015  . GERD (gastroesophageal reflux disease) 10/22/2015  . Restless legs 10/22/2015  . Chronic venous stasis dermatitis of both lower extremities 10/08/2015  . CAD in native artery 09/15/2015  . PVC's (premature ventricular contractions) 09/15/2015  . Sinus pause 09/14/2015  . Morbid obesity ([place]) 07/03/2015  . Uncontrolled type II diabetes with peripheral autonomic neuropathy ([place]) 05/09/2015  . Hyperlipidemia LDL goal <70 05/02/2015  . Abnormal nuclear stress test 05/02/2015  . Neurogenic bladder  04/26/2015  . Pressure ulcer 04/15/2015  . UTI (urinary tract infection) due to urinary indwelling catheter ([place]) 04/14/2015  . History of DVT (deep vein thrombosis) 12/08/2014  . Chronic diastolic CHF (congestive heart failure) ([place]) 04/01/2014  . Chronic indwelling Foley catheter 12/23/2013  . NSVT (nonsustained ventricular tachycardia) ([place]) 10/26/2011  . Diabetes mellitus due to underlying condition, uncontrolled,  with hyperosmolarity without coma, with long-term current use of insulin ([place]) 10/10/2011  . Diabetic neuropathy ([place]) 10/10/2011  . OSA (obstructive sleep apnea) 10/10/2011    Past Surgical History:  Procedure Laterality Date  . CARDIAC CATHETERIZATION N/A 05/02/2015   Procedure: Left Heart Cath and Coronary Angiography;  Surgeon: Lorretta Harp, MD;  Location: [place] CV LAB;  Service: Cardiovascular;  Laterality: N/A;  . CARDIAC CATHETERIZATION N/A 05/04/2015   Procedure: Left Heart Cath and Coronary Angiography;  Surgeon: Wellington Hampshire, MD;  Location: [facility] CV LAB;  Service: Cardiovascular;  Laterality: N/A;  . CARPAL TUNNEL RELEASE Bilateral ~ 2003-2004  . CHOLECYSTECTOMY OPEN  1970's?  [facility] GASTROPLASTY    . JOINT REPLACEMENT    . LEFT HEART CATHETERIZATION WITH CORONARY ANGIOGRAM N/A 10/24/2011   Procedure: LEFT HEART CATHETERIZATION WITH CORONARY ANGIOGRAM;  Surgeon: Candee Furbish, MD;  Location[facility] CATH LAB;  Service: Cardiovascular;  Laterality: N/A;  right radial artery approach  . TOTAL KNEE ARTHROPLASTY Right 1990's       Home Medications    Prior to Admission medications   Medication Sig Start Date End Date Taking? Authorizing Provider  aluminum-magnesium hydroxide-simethicone (MAALOX) [phone] MG/5ML SUSP Take 30 mLs by mouth every 6 (six) hours as needed.   Yes Historical Provider, MD  Amino Acids-Protein Hydrolys (FEEDING SUPPLEMENT, PRO-STAT SUGAR FREE 64,) LIQD Take 60 mLs by mouth 3 (three) times daily with meals.   Yes Historical Provider, MD  amLODipine (NORVASC) 5 MG tablet Take 5 mg by mouth daily.   Yes Historical Provider, MD  aspirin EC 81 MG tablet Take 81 mg by mouth daily. 0900   Yes Historical Provider, MD  escitalopram (LEXAPRO) 10 MG tablet Take 10 mg by mouth daily.    Yes Historical Provider, MD  folic acid (FOLVITE) 1 MG tablet Take 1 mg by mouth daily.   Yes Historical Provider, MD  gabapentin (NEURONTIN) 100 MG capsule Take 1 capsule  (100 mg total) by mouth 2 (two) times daily. 01/16/16  Yes Reyne Dumas, MD  hydrOXYzine (ATARAX/VISTARIL) 25 MG tablet Take 25 mg by mouth 4 (four) times daily.    Yes Historical Provider, MD  insulin aspart (NOVOLOG FLEXPEN) 100 UNIT/ML FlexPen Inject 0-10 Units into the skin 3 (three) times daily with meals. Patient uses sliding scale, per scale at facility, ranging from 0-12 units. CBG greater than 450, call MD.   Yes Historical Provider, MD  Insulin Glargine-Lixisenatide (SOLIQUA) 100-33 UNT-MCG/ML SOPN Inject 30 Units into the skin at bedtime.   Yes Historical Provider, MD  magnesium oxide (MAG-OX) 400 MG tablet Take 400 mg by mouth daily. 0900   Yes Historical Provider, MD  methotrexate (RHEUMATREX) 2.5 MG tablet Take 7.5 mg by mouth once a week. Caution:Chemotherapy. Protect from light.   Yes Historical Provider, MD  metoprolol tartrate (LOPRESSOR) 25 MG tablet 0.5 tablet by mouth two times a day 0900 , 2100   Yes Historical Provider, MD  Naproxen Sodium 220 MG CAPS Take 220 mg by mouth 2 (two) times daily.   Yes Historical Provider, MD  omeprazole (PRILOSEC) 40 MG capsule Take 40 mg by  mouth daily. 0600   Yes Historical Provider, MD  potassium chloride (KLOR-CON) 20 MEQ packet Take 40 mEq by mouth daily. 0800   Yes Historical Provider, MD  predniSONE (DELTASONE) 20 MG tablet Take 40 mg by mouth daily with breakfast.   Yes Historical Provider, MD  rOPINIRole (REQUIP) 0.5 MG tablet Take 0.5 mg by mouth at bedtime. 2100   Yes Historical Provider, MD  sennosides-docusate sodium (SENOKOT-S) 8.6-50 MG tablet Take 2 tablets by mouth at bedtime. 2100   Yes Historical Provider, MD  torsemide (DEMADEX) 20 MG tablet Take 20 mg by mouth 2 (two) times daily.   Yes Historical Provider, MD  traMADol-acetaminophen (ULTRACET) 37.5-325 MG tablet Take 2 tablets by mouth every 6 (six) hours as needed for moderate pain. 01/16/16  Yes Reyne Dumas, MD  traZODone (DESYREL) 50 MG tablet Take 25 mg by mouth at bedtime.    Yes Historical Provider, MD  tuberculin 5 UNIT/0.1ML injection Inject 5 Units into the skin once. Injected on 05-25-16   Yes Historical Provider, MD  vitamin C (ASCORBIC ACID) 500 MG tablet Take 500 mg by mouth 2 (two) times daily. 0900 and 2100   Yes Historical Provider, MD  doxycycline (VIBRAMYCIN) 100 MG capsule Take 1 capsule (100 mg total) by mouth 2 (two) times daily. Patient not taking: Reported on 05/27/2016 05/05/16   Tanna Furry, MD  insulin NPH Human (HUMULIN N,NOVOLIN N) 100 UNIT/ML injection Inject 40 Units into the skin 2 (two) times daily. 800 am and 10pm     Historical Provider, MD  nitroGLYCERIN (NITROSTAT) 0.4 MG SL tablet Place 0.4 mg under the tongue every 5 (five) minutes as needed for chest pain.    Historical Provider, MD    Family History Family History  Problem Relation Age of Onset  . Diabetes type I Father   . Hypertension Mother   . Cancer Brother      Testicular Cancer  . Cancer Brother     Leukemia    Social History Social History  Substance Use Topics  . Smoking status: Former Smoker    Packs/day: 2.00    Years: 20.00    Types: Cigarettes    Quit date: 09/10/1976  . Smokeless tobacco: Never Used  . Alcohol use No     Comment: FORMER ALCOHOLIC; "sober since 95/63/8756"     Allergies   Ace inhibitors; Lipitor [atorvastatin calcium]; Metformin and related; Cefadroxil; Cephalexin; Morphine and related; and Robaxin [methocarbamol]   Review of Systems Review of Systems  Constitutional: Positive for chills and fatigue. Negative for fever.  HENT: Negative for congestion and rhinorrhea.   Eyes: Negative for visual disturbance.  Respiratory: Positive for cough, chest tightness and shortness of breath. Negative for wheezing.   Cardiovascular: Positive for chest pain. Negative for leg swelling.  Gastrointestinal: Positive for nausea and vomiting. Negative for abdominal pain and diarrhea.  Genitourinary: Negative for dysuria and flank pain.  Musculoskeletal:  Negative for neck pain and neck stiffness.  Skin: Positive for rash and wound.  Allergic/Immunologic: Negative for immunocompromised state.  Neurological: Negative for syncope, weakness and headaches.  All other systems reviewed and are negative.    Physical Exam Updated Vital Signs BP 100/63 (BP Location: Right Arm)   Pulse (!) 113   Temp 98 F (36.7 C) (Oral)   Resp (!) 23   SpO2 99%   Physical Exam  Constitutional: He is oriented to person, place, and time. He appears well-developed. He has a sickly appearance. No distress.  HENT:  Head: Normocephalic and atraumatic.  Dry MM  Eyes: Conjunctivae are normal.  Neck: Neck supple.  Cardiovascular: Regular rhythm and normal heart sounds.  Tachycardia present.  Exam reveals no friction rub.   No murmur heard. Pulmonary/Chest: Effort normal and breath sounds normal. Tachypnea noted. No respiratory distress. He has no decreased breath sounds. He has no wheezes. He has no rales.  Abdominal: Soft. He exhibits no distension.  Musculoskeletal: He exhibits no edema.  Neurological: He is alert and oriented to person, place, and time. He exhibits normal muscle tone.  Skin: Skin is warm. Capillary refill takes less than 2 seconds.  Diffuse, superficial excoriations and ruptured blisters with moderate erythema surrounding excoriations on b/l LE. No fluctuance.  Psychiatric: He has a normal mood and affect.  Nursing note and vitals reviewed.    ED Treatments / Results  Labs (all labs ordered are listed, but only abnormal results are displayed) Labs Reviewed  MRSA PCR SCREENING - Abnormal; Notable for the following:       Result Value   MRSA by PCR POSITIVE (*)    All other components within normal limits  BLOOD CULTURE ID PANEL (REFLEXED) - Abnormal; Notable for the following:    Staphylococcus species DETECTED (*)    Staphylococcus aureus DETECTED (*)    Methicillin resistance DETECTED (*)    All other components within normal  limits  BASIC METABOLIC PANEL - Abnormal; Notable for the following:    Sodium 127 (*)    Chloride 87 (*)    Glucose, Bld 587 (*)    BUN 21 (*)    Calcium 8.8 (*)    All other components within normal limits  CBC - Abnormal; Notable for the following:    WBC 29.3 (*)    All other components within normal limits  HEPATIC FUNCTION PANEL - Abnormal; Notable for the following:    Albumin 2.8 (*)    Alkaline Phosphatase 138 (*)    All other components within normal limits  BRAIN NATRIURETIC PEPTIDE - Abnormal; Notable for the following:    B Natriuretic Peptide 141.6 (*)    All other components within normal limits  URINALYSIS, ROUTINE W REFLEX MICROSCOPIC (NOT AT [facility]) - Abnormal; Notable for the following:    APPearance TURBID (*)    Glucose, UA >1000 (*)    Hgb urine dipstick MODERATE (*)    Ketones, ur 15 (*)    Protein, ur 30 (*)    Nitrite POSITIVE (*)    Leukocytes, UA MODERATE (*)    All other components within normal limits  TROPONIN I - Abnormal; Notable for the following:    Troponin I 0.11 (*)    All other components within normal limits  PROTIME-INR - Abnormal; Notable for the following:    Prothrombin Time 15.5 (*)    All other components within normal limits  URINE MICROSCOPIC-ADD ON - Abnormal; Notable for the following:    Squamous Epithelial / LPF 0-5 (*)    Bacteria, UA MANY (*)    All other components within normal limits  LACTIC ACID, PLASMA - Abnormal; Notable for the following:    Lactic Acid, Venous 2.7 (*)    All other components within normal limits  GLUCOSE, CAPILLARY - Abnormal; Notable for the following:    Glucose-Capillary 471 (*)    All other components within normal limits  GLUCOSE, CAPILLARY - Abnormal; Notable for the following:    Glucose-Capillary 388 (*)    All other components within normal limits  GLUCOSE, CAPILLARY - Abnormal; Notable for the following:    Glucose-Capillary 381 (*)    All other components within normal limits    GLUCOSE, CAPILLARY - Abnormal; Notable for the following:    Glucose-Capillary 382 (*)    All other components within normal limits  GLUCOSE, CAPILLARY - Abnormal; Notable for the following:    Glucose-Capillary 489 (*)    All other components within normal limits  GLUCOSE, CAPILLARY - Abnormal; Notable for the following:    Glucose-Capillary 271 (*)    All other components within normal limits  GLUCOSE, CAPILLARY - Abnormal; Notable for the following:    Glucose-Capillary 257 (*)    All other components within normal limits  GLUCOSE, CAPILLARY - Abnormal; Notable for the following:    Glucose-Capillary 220 (*)    All other components within normal limits  GLUCOSE, CAPILLARY - Abnormal; Notable for the following:    Glucose-Capillary 185 (*)    All other components within normal limits  GLUCOSE, CAPILLARY - Abnormal; Notable for the following:    Glucose-Capillary 166 (*)    All other components within normal limits  GLUCOSE, CAPILLARY - Abnormal; Notable for the following:    Glucose-Capillary 158 (*)    All other components within normal limits  GLUCOSE, CAPILLARY - Abnormal; Notable for the following:    Glucose-Capillary 156 (*)    All other components within normal limits  GLUCOSE, CAPILLARY - Abnormal; Notable for the following:    Glucose-Capillary 133 (*)    All other components within normal limits  GLUCOSE, CAPILLARY - Abnormal; Notable for the following:    Glucose-Capillary 145 (*)    All other components within normal limits  I-STAT VENOUS BLOOD GAS, ED - Abnormal; Notable for the following:    pO2, Ven 21.0 (*)    Bicarbonate 29.3 (*)    All other components within normal limits  I-STAT CG4 LACTIC ACID, ED - Abnormal; Notable for the following:    Lactic Acid, Venous 2.44 (*)    All other components within normal limits  CBG MONITORING, ED - Abnormal; Notable for the following:    Glucose-Capillary 480 (*)    All other components within normal limits  CBG  MONITORING, ED - Abnormal; Notable for the following:    Glucose-Capillary 464 (*)    All other components within normal limits  CULTURE, BLOOD (ROUTINE X 2)  CULTURE, BLOOD (ROUTINE X 2)  URINE CULTURE  TROPONIN I  TROPONIN I  PROCALCITONIN  APTT  D-DIMER, QUANTITATIVE (NOT AT [facility])  CORTISOL  LACTIC ACID, PLASMA  HEMOGLOBIN A1C  I-STAT TROPOININ, ED  I-STAT CG4 LACTIC ACID, ED    EKG  EKG Interpretation  Date/Time:  Sunday May 27 2016 06:43:48 EDT Ventricular Rate:  107 PR Interval:    QRS Duration: 117 QT Interval:  355 QTC Calculation: 474 R Axis:   -51 Text Interpretation:  Sinus tachycardia Incomplete left bundle branch block Anterior Q waves, not new No acute changes No significant change since last tracing Confirmed by Kathrynn Humble, MD, Thelma Comp [phone]) on 05/27/2016 6:54:56 AM       Radiology Dg Chest Portable 1 View  Result Date: 05/27/2016 CLINICAL DATA:  Right side chest pain and SOB x this morning. Hx of HTN, diabetes, CAD, CHF. EXAM: PORTABLE CHEST 1 VIEW COMPARISON:  01/11/2016 FINDINGS: Normal cardiac silhouette. There are low lung volumes with bibasilar atelectasis not changed. No pneumothorax. IMPRESSION: Low lung volumes and bibasilar atelectasis.  No change. Electronically Signed   By:  Suzy Bouchard M.D.   On: 05/27/2016 08:37    Procedures .Critical Care Performed by: Duffy Bruce Authorized by: Duffy Bruce   Critical care provider statement:    Critical care time (minutes):  45   Critical care time was exclusive of:  Separately billable procedures and treating other patients   Critical care was necessary to treat or prevent imminent or life-threatening deterioration of the following conditions:  Circulatory failure, sepsis, shock and respiratory failure   Critical care was time spent personally by me on the following activities:  Blood draw for specimens, development of treatment plan with patient or surrogate, discussions with  consultants, examination of patient, obtaining history from patient or surrogate, evaluation of patient's response to treatment, ordering and performing treatments and interventions, ordering and review of laboratory studies, ordering and review of radiographic studies, pulse oximetry, re-evaluation of patient's condition and review of old charts   I assumed direction of critical care for this patient from another provider in my specialty: no      (including critical care time)  Angiocath insertion Performed by: Evonnie Pat  Consent: Verbal consent obtained. Risks and benefits: risks, benefits and alternatives were discussed Time out: Immediately prior to procedure a "time out" was called to verify the correct patient, procedure, equipment, support staff and site/side marked as required.  Preparation: Patient was prepped and draped in the usual sterile fashion.  Vein Location: left forearm  Ultrasound Guided  Gauge: 18  Normal blood return and flush without difficulty Patient tolerance: Patient tolerated the procedure well with no immediate complications.     Medications Ordered in ED Medications  vancomycin (VANCOCIN) 1,500 mg in sodium chloride 0.9 % 500 mL IVPB (1,500 mg Intravenous Given 05/27/16 2043)  piperacillin-tazobactam (ZOSYN) IVPB 3.375 g (3.375 g Intravenous Given 05/28/16 0518)  acetaminophen (TYLENOL) tablet 650 mg (not administered)  ondansetron (ZOFRAN) injection 4 mg (not administered)  enoxaparin (LOVENOX) injection 40 mg (40 mg Subcutaneous Given 05/27/16 1418)  0.9 %  sodium chloride infusion ( Intravenous Stopped 05/27/16 1900)  hydrocortisone sodium succinate (SOLU-CORTEF) 100 MG injection 50 mg (50 mg Intravenous Given 05/28/16 0100)  traZODone (DESYREL) tablet 25 mg (25 mg Oral Given 1/61/09 6045)  folic acid (FOLVITE) tablet 1 mg (1 mg Oral Given 05/27/16 1425)  aspirin EC tablet 81 mg (81 mg Oral Given 05/27/16 1425)  escitalopram (LEXAPRO) tablet 10 mg  (10 mg Oral Given 05/27/16 1425)  hydrOXYzine (ATARAX/VISTARIL) tablet 25 mg (25 mg Oral Given 05/27/16 2145)  magnesium oxide (MAG-OX) tablet 400 mg (400 mg Oral Given 05/27/16 1425)  rOPINIRole (REQUIP) tablet 0.5 mg (0.5 mg Oral Given 05/27/16 2145)  senna-docusate (Senokot-S) tablet 2 tablet (2 tablets Oral Given 05/27/16 2145)  gabapentin (NEURONTIN) capsule 100 mg (100 mg Oral Given 05/27/16 2145)  traMADol-acetaminophen (ULTRACET) 37.5-325 MG per tablet 2 tablet (2 tablets Oral Given 05/28/16 0518)  dextrose 5 %-0.45 % sodium chloride infusion ( Intravenous Rate/Dose Verify 05/28/16 0600)  insulin regular bolus via infusion 0-10 Units (0 Units Intravenous Not Given 05/27/16 1700)  insulin regular (NOVOLIN R,HUMULIN R) 250 Units in sodium chloride 0.9 % 250 mL (1 Units/mL) infusion (5.2 Units/hr Intravenous Rate/Dose Change 05/28/16 0621)  dextrose 50 % solution 25 mL (not administered)  0.9 %  sodium chloride infusion ( Intravenous Stopped 05/27/16 2154)  mupirocin ointment (BACTROBAN) 2 % 1 application (1 application Nasal Given 05/27/16 2145)  Chlorhexidine Gluconate Cloth 2 % PADS 6 each (6 each Topical Given 05/28/16 0519)  sodium chloride 0.9 %  bolus 1,000 mL (0 mLs Intravenous Stopped 05/27/16 0810)  sodium chloride 0.9 % bolus 1,000 mL (0 mLs Intravenous Stopped 05/27/16 0906)  lactated ringers bolus 1,000 mL (0 mLs Intravenous Stopped 05/27/16 0950)  piperacillin-tazobactam (ZOSYN) IVPB 3.375 g (0 g Intravenous Stopped 05/27/16 0839)  vancomycin (VANCOCIN) 2,000 mg in sodium chloride 0.9 % 500 mL IVPB (0 mg Intravenous Stopped 05/27/16 1123)  hydrocortisone sodium succinate (SOLU-CORTEF) 100 MG injection 100 mg (100 mg Intravenous Given 05/27/16 0750)  lactated ringers bolus 1,000 mL (0 mLs Intravenous Stopped 05/27/16 1123)  fentaNYL (SUBLIMAZE) injection 25 mcg (25 mcg Intravenous Given 05/27/16 0941)     Initial Impression / Assessment and Plan / ED Course  I have reviewed the triage vital  signs and the nursing notes.  Pertinent labs & imaging results that were available during my care of the patient were reviewed by me and considered in my medical decision making (see chart for details).  Clinical Course   70 yo M with PMHx of HTN, pemphigus, CAD, CHF who p/w chest pain, fatigue, and poor PO intake. See HPI above. On arrival, pt tachycardic, hypotensive, ill-appearing but non-toxic, in NAD, with tachypnea but clear BS bilaterally He does have diffuse skin wounds with c/f superimposed cellulitis on LE. At this time, primary concern is acute sepsis of unknown etiology, DDx includes PNA, UTI, cellulitis. Will start broad-spectrum ABX and give stress dose hydrocoritsone. Otherwise, primary adrenal insufficiency, CHF, less likely ACS also on DDx. EKG non ischemic. Will start fluid resuscitation but due to pt's weight, 5L required for 30 cc/kg. GIven his hi/o CHF and only borderline hypotension, will hold on full fluid bolus as I feel this would likely fluid overload and cause CHF exacerbation - will follow-up LA.  Labs, imaging as above. CXR is clear. CBC shows leukocytosis of 29k, c/w acute inflammation versus infection. BMP shows hyperglycemia of 587 but CO2 26, normal AG - likely stress hyperglycemia. BNP mildly elevated. Trop negative. LA 2.44 (<4).  At this time, BP improving, HR improving with 4L fluid resuscitation. Discussed with Hospitalist. GIven down trending LA, will hold on further fluid boluses given CHF, worsening hypervolemia on exam. Discussed with pt and he is in agreement with cautious fluids rather than full 30 cc/kg bolus. Will continue mIVF. UA returned and is c/w UTI. Will admit to Step Down for sepsis.  Final Clinical Impressions(s) / ED Diagnoses   Final diagnoses:  Sepsis, due to unspecified organism ([place])  Lactic acidosis  UTI (lower urinary tract infection)  Elevated brain natriuretic peptide (BNP) level    New Prescriptions Current Discharge Medication  List       Duffy Bruce, MD 05/28/16 [phone]

## 2016-05-27 NOTE — ED Notes (Signed)
CBG 464 

## 2016-05-27 NOTE — Plan of Care (Signed)
Problem: Education: Goal: Knowledge of [facility] Education information/materials will improve Outcome: Progressing Patient oriented to unit and room. Given call light and phone and instructed on how to use. Patient made aware of plan of care regarding glucose stabilizer. Further explained importance of treatment. Patient non compliant at times during my shift, but was able to convince patient to let us treat him so we can get him better to go back to his facility.  Explained how to rate pain and our scale. Patient aware that he is NPO and only able to take in water.   Problem: Safety: Goal: Ability to remain free from injury will improve Outcome: Progressing Patient is bed bound, but he is aware that if he needs assistance he will need to call staff. He verbalized understanding. Call light in reach and room left uncluttered. No attempts made to get out of bed on my shift.  Problem: Health Behavior/Discharge Planning: Goal: Ability to manage health-related needs will improve Outcome: Progressing Patient had to be educated to avoid noncompliance. Explained to patient that we cannot help him if he won't let us treat him and that if he chooses to leave that it would be dangerous. After repeated attempts to educate he finally consented to treatment.   Problem: Pain Managment: Goal: General experience of comfort will improve Outcome: Progressing Patient complains of chest pain, which is chronic and was relieved with tramadol.   Problem: Skin Integrity: Goal: Risk for impaired skin integrity will decrease Outcome: Progressing WOC consult placed to ensure we have orders to treat patient's extensive wounds. Patient with bullous pemphigus. Attempted to put patient on air mattress but he refused. MD made aware. Attempts made to turn patient, but he refuses at intervals.  Problem: Tissue Perfusion: Goal: Risk factors for ineffective tissue perfusion will decrease Outcome:  Progressing Patient receiving lovenox for VTE prophylaxis.  Problem: Nutrition: Goal: Adequate nutrition will be maintained Outcome: Progressing Patient NPO for glucose stabilizer at this time.

## 2016-05-27 NOTE — ED Notes (Signed)
Per Erin Hearing, NP, to give 25 units of novolog and the 10 units of lantus.

## 2016-05-27 NOTE — ED Notes (Signed)
750 ml drained from PT cath bag that was pre-installed to visit

## 2016-05-27 NOTE — Progress Notes (Signed)
ANTIBIOTIC CONSULT NOTE - INITIAL  Pharmacy Consult for Vanco/Zosyn Indication: sepsis  Allergies  Allergen Reactions  . Ace Inhibitors Swelling    Pt tolerates lisinopril  . Lipitor [Atorvastatin Calcium] Swelling  . Metformin And Related Swelling  . Cefadroxil Hives  . Cephalexin Hives  . Morphine And Related Other (See Comments)    Sweating, feels like is "in rocky boat."  . Robaxin [Methocarbamol] Other (See Comments)    Feels like he is shaky    Patient Measurements:   Adjusted Body Weight:   Vital Signs: Temp: 98 F (36.7 C) (09/17 0641) Temp Source: Oral (09/17 0641) BP: 97/75 (09/17 0700) Pulse Rate: 106 (09/17 0700) Intake/Output from previous day: No intake/output data recorded. Intake/Output from this shift: No intake/output data recorded.  Labs:  Recent Labs  05/27/16 0633  WBC 29.3*  HGB 13.6  PLT 227  CREATININE 1.12   Estimated Creatinine Clearance: 99 mL/min (by C-G formula based on SCr of 1.12 mg/dL). No results for input(s): VANCOTROUGH, VANCOPEAK, VANCORANDOM, GENTTROUGH, GENTPEAK, GENTRANDOM, TOBRATROUGH, TOBRAPEAK, TOBRARND, AMIKACINPEAK, AMIKACINTROU, AMIKACIN in the last 72 hours.   Microbiology: No results found for this or any previous visit (from the past 720 hour(s)).  Medical History: Past Medical History:  Diagnosis Date  . Arthritis    "hands, ankles, knees" (11/10/2014)  . Bladder spasms   . CAD (coronary artery disease)   . CAD in native artery 09/15/2015  . CHF (congestive heart failure) ([place])   . Chronic indwelling Foley catheter   . Chronic lower back pain   . Depression   . Diabetic diarrhea ([place])   . Diabetic neuropathy, painful ([place])   . DVT (deep venous thrombosis) ([facility]) 1980's   LLE  . GERD (gastroesophageal reflux disease)   . Hyperlipidemia   . Hypertension   . Neurogenic bladder   . Obese   . OSA (obstructive sleep apnea)    "they wanted me to wear a mask; I couldn't" (11/10/2014)  . Pneumonia 1999  . PONV  (postoperative nausea and vomiting)   . Stroke [facility]) 10/2011; 06/2014   right hand numbness/notes 10/20/2011, pt not sure he had stroke in 2013; "right hand weaker and right face not quite right since" (11/10/2014)  . Type II diabetes mellitus ([facility])     Medications:   Assessment: 70 y/o F presents for [facility] c/o R sided CP. Pt hypotensive.   Labs: Na 127, Scr 1.12 (baseline <0.1), K=4.9, Glucose 587, WBC 29.3.  Goal of Therapy:  Vancomycin trough level 15-20 mcg/ml  Plan:  Zosyn 3.375g IV 8 hrs Vancomycin 2g IV x 1 then 1500mg  IV q12h Check Vancomycin trough after 3-5 doses at steady state.   Cassandre Oleksy S. Alford Highland, PharmD, BCPS Clinical Staff Pharmacist Pager [phone]  Eilene Ghazi Stillinger 05/27/2016,7:32 AM

## 2016-05-27 NOTE — H&P (Addendum)
History and Physical    [name] VPX:[id number] DOB: Oct 19, 1945 DOA: 05/27/2016   PCP: Irven Shelling, MD   Patient coming from/Resides with: Skilled nursing facility  Admission status: Inpatient stepdown--/medically necessary to stay a minimum 2 midnights to rule out impending and/or unexpected changes in physiologic status that may differ from initial evaluation performed in the ER and/or at time of admission. Presents with sepsis physiology requiring stress dose IV steroids, IV antibiotics, and close monitoring of hemodynamic status. Etiology to sepsis unknown at time of admission. Patient also having chest pain typical for previous CAD symptomatology  Chief Complaint: Chest pain  HPI: [name] is a 70 y.o. 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 Patient was sent to the skilled nursing facility for reports of chest pain. Patient states that he awakened early this morning with significant chest pressure and "it felt like someone was standing on my chest" which was typical for his previous cardiac pain. The nursing facility given a total of 3 nitroglycerin which initially resolved his pain. He went back to sleep. He will awaken with the same pain requested additional nitroglycerin which did not improve his symptoms. Upon arrival to the ER he was found to have a white count of 29,300 with a glucose of 587 and a blood pressure of 85/56. Subsequent lactic acid was elevated 2.44. His MAP was greater than 65 but it was felt patient had sepsis physiology and  therefore he was started on stress dose steroids and given multiple IV fluid challenges. Source unclear although EDP felt patient had increasing erythematous changes in his legs and felt he had a secondary cellulitis in setting of known bullous pemphigus. Request was made for internal medicine admission.  Upon further discussion with the patient he can clarified the above chest pain findings and improvement after nitroglycerin initially but second episode did not improve after nitroglycerin. He has not had any fevers or chills although this had increasing fatigue and a mild, nonproductive cough in the past 24 hours. Reports that he feels he still has some decubitus on both buttocks. He reports he is nonambulatory and in the past from the facility has attempted to get him out of bed to the chair he describes what sounds like a significant orthostatic episode. He tells me his Foley catheter has been in place for many years. He has seen a urologist but he cannot recall the urologist name. He reports he has had some nausea without vomiting for 48 hours. He reports that he may be having slightly increased drainage from the open areas on his legs but he is unable to characterize the color or consistency of drainage since he cannot see his legs.  ED Course:  Vital Signs: BP 107/70   Pulse 103   Temp 98 F (36.7 C) (Oral)   Resp 24   SpO2 97%  PCXR: Low lung volumes and bibasilar atelectasis Lab data: Sodium 127, potassium 4.9, chloride 87, BUN 21, creatinine 1.12, glucose 587, anion gap 14, alkaline phosphatase 138, albumin 2.8, LFTs otherwise normal, BNP 142, poc troponin 0.00, lactic acid 2.44, WBC 29,300 differential not obtained, hemoglobin 13.6, platelets 227,000,  urinalysis and culture ordered but have not been obtained, blood cultures 2 obtained in the ER Medications and treatments: Normal saline bolus 4 L, Zosyn 3.375 g IV 1, vancomycin 2 g IV 1, Solu-Cortef 100 mg IV 1, fentanyl 25 g IV  1  Review of Systems:  In addition to the HPI above,  No Fever-chills, myalgias or other constitutional symptoms No Headache, changes with Vision or hearing, new weakness, tingling, numbness in any extremity, No problems swallowing food or Liquids, indigestion/reflux No Cough or Shortness of Breath, palpitations, orthopnea or DOE No Abdominal pain, emesis, melena or hematochezia, no dark tarry stools No dysuria, hematuria or flank pain No new skin rashes, lesions, masses or bruises (see above) No new joints pains-aches No recent weight gain or loss No polyuria, polydypsia or polyphagia,   Past Medical History:  Diagnosis Date  . Arthritis    "hands, ankles, knees" (11/10/2014)  . Bladder spasms   . CAD (coronary artery disease)   . CAD in native artery 09/15/2015  . CHF (congestive heart failure) ([place])   . Chronic indwelling Foley catheter   . Chronic lower back pain   . Depression   . Diabetic diarrhea ([place])   . Diabetic neuropathy, painful ([place])   . DVT (deep venous thrombosis) ([facility]) 1980's   LLE  . GERD (gastroesophageal reflux disease)   . Hyperlipidemia   . Hypertension   . Neurogenic bladder   . Obese   . OSA (obstructive sleep apnea)    "they wanted me to wear a mask; I couldn't" (11/10/2014)  . Pneumonia 1999  . PONV (postoperative nausea and vomiting)   . Stroke [facility]) 10/2011; 06/2014   right hand numbness/notes 10/20/2011, pt not sure he had stroke in 2013; "right hand weaker and right face not quite right since" (11/10/2014)  . Type II diabetes mellitus ([place])     Past Surgical History:  Procedure Laterality Date  . CARDIAC CATHETERIZATION N/A 05/02/2015   Procedure: Left Heart Cath and Coronary Angiography;  Surgeon: Lorretta Harp, MD;  Location: [place] [facility];  Service: Cardiovascular;  Laterality: N/A;  . CARDIAC CATHETERIZATION N/A 05/04/2015   Procedure: Left Heart Cath and Coronary Angiography;  Surgeon: Wellington Hampshire, MD;  Location: [place] [facility]  LAB;  Service: Cardiovascular;  Laterality: N/A;  . CARPAL TUNNEL RELEASE Bilateral ~ 2003-2004  . CHOLECYSTECTOMY OPEN  1970's?  Marland Kitchen GASTROPLASTY    . JOINT REPLACEMENT    . LEFT HEART CATHETERIZATION WITH CORONARY ANGIOGRAM N/A 10/24/2011   Procedure: LEFT HEART CATHETERIZATION WITH CORONARY ANGIOGRAM;  Surgeon: Candee Furbish, MD;  Location: [facility] CATH LAB;  Service: Cardiovascular;  Laterality: N/A;  right radial artery approach  . TOTAL KNEE ARTHROPLASTY Right 1990's    Social History   Social History  . Marital status: Widowed    Spouse name: N/A  . Number of children: N/A  . Years of education: N/A   Occupational History  . Retired    Social History Main Topics  . Smoking status: Former Smoker    Packs/day: 2.00    Years: 20.00    Types: Cigarettes    Quit date: 09/10/1976  . Smokeless tobacco: Never Used  . Alcohol use No     Comment: FORMER ALCOHOLIC; "sober since 79/10/4095"  . Drug use: No  . Sexual activity: No   Other Topics Concern  . Not on file   Social History Narrative   Lives alone.  No close family nearby.  Mobility: Bedbound Work history: Disabled; resides at nursing facility-patient placed there after evaluation by Adult Protective Services earlier this year after patient determined to be unable to care for self independently   Allergies  Allergen Reactions  . Ace Inhibitors Swelling    Pt tolerates lisinopril  . Lipitor [Atorvastatin Calcium] Swelling  . Metformin And Related Swelling  . Cefadroxil Hives  . Cephalexin Hives  . Morphine And Related Other (See Comments)    Sweating, feels like is "in rocky boat."  . Robaxin [Methocarbamol] Other (See Comments)    Feels like he is shaky    Family History  Problem Relation Age of Onset  . Diabetes type I Father   . Hypertension Mother   . Cancer Brother      Testicular Cancer  . Cancer Brother     Leukemia     Prior to Admission medications   Medication Sig Start Date End Date Taking?  Authorizing Provider  aluminum-magnesium hydroxide-simethicone (MAALOX) [phone] MG/5ML SUSP Take 30 mLs by mouth every 6 (six) hours as needed.   Yes Historical Provider, MD  Amino Acids-Protein Hydrolys (FEEDING SUPPLEMENT, PRO-STAT SUGAR FREE 64,) LIQD Take 60 mLs by mouth 3 (three) times daily with meals.   Yes Historical Provider, MD  amLODipine (NORVASC) 5 MG tablet Take 5 mg by mouth daily.   Yes Historical Provider, MD  aspirin EC 81 MG tablet Take 81 mg by mouth daily. 0900   Yes Historical Provider, MD  escitalopram (LEXAPRO) 10 MG tablet Take 10 mg by mouth daily.    Yes Historical Provider, MD  folic acid (FOLVITE) 1 MG tablet Take 1 mg by mouth daily.   Yes Historical Provider, MD  gabapentin (NEURONTIN) 100 MG capsule Take 1 capsule (100 mg total) by mouth 2 (two) times daily. 01/16/16  Yes Reyne Dumas, MD  hydrOXYzine (ATARAX/VISTARIL) 25 MG tablet Take 25 mg by mouth 4 (four) times daily.    Yes Historical Provider, MD  insulin aspart (NOVOLOG FLEXPEN) 100 UNIT/ML FlexPen Inject 0-10 Units into the skin 3 (three) times daily with meals. Patient uses sliding scale, per scale at facility, ranging from 0-12 units. CBG greater than 450, call MD.   Yes Historical Provider, MD  Insulin Glargine-Lixisenatide (SOLIQUA) 100-33 UNT-MCG/ML SOPN Inject 30 Units into the skin at bedtime.   Yes Historical Provider, MD  magnesium oxide (MAG-OX) 400 MG tablet Take 400 mg by mouth daily. 0900   Yes Historical Provider, MD  methotrexate (RHEUMATREX) 2.5 MG tablet Take 7.5 mg by mouth once a week. Caution:Chemotherapy. Protect from light.   Yes Historical Provider, MD  metoprolol tartrate (LOPRESSOR) 25 MG tablet 0.5 tablet by mouth two times a day 0900 , 2100   Yes Historical Provider, MD  Naproxen Sodium 220 MG CAPS Take 220 mg by mouth 2 (two) times daily.   Yes Historical Provider, MD  omeprazole (PRILOSEC) 40 MG capsule Take 40 mg by mouth daily. 0600   Yes Historical Provider, MD  potassium  chloride (KLOR-CON) 20 MEQ packet Take 40 mEq by mouth daily. 0800   Yes Historical Provider, MD  predniSONE (DELTASONE) 20 MG tablet Take 40 mg by mouth daily with breakfast.   Yes Historical Provider, MD  rOPINIRole (REQUIP) 0.5 MG tablet Take 0.5 mg by mouth at bedtime. 2100   Yes Historical Provider, MD  sennosides-docusate sodium (SENOKOT-S) 8.6-50 MG tablet Take 2 tablets by mouth at bedtime. 2100   Yes Historical Provider, MD  torsemide (DEMADEX) 20 MG tablet Take 20  mg by mouth 2 (two) times daily.   Yes Historical Provider, MD  traMADol-acetaminophen (ULTRACET) 37.5-325 MG tablet Take 2 tablets by mouth every 6 (six) hours as needed for moderate pain. 01/16/16  Yes Reyne Dumas, MD  traZODone (DESYREL) 50 MG tablet Take 25 mg by mouth at bedtime.   Yes Historical Provider, MD  tuberculin 5 UNIT/0.1ML injection Inject 5 Units into the skin once. Injected on 05-25-16   Yes Historical Provider, MD  vitamin C (ASCORBIC ACID) 500 MG tablet Take 500 mg by mouth 2 (two) times daily. 0900 and 2100   Yes Historical Provider, MD  doxycycline (VIBRAMYCIN) 100 MG capsule Take 1 capsule (100 mg total) by mouth 2 (two) times daily. Patient not taking: Reported on 05/27/2016 05/05/16   Tanna Furry, MD  insulin NPH Human (HUMULIN N,NOVOLIN N) 100 UNIT/ML injection Inject 40 Units into the skin 2 (two) times daily. 800 am and 10pm     Historical Provider, MD  nitroGLYCERIN (NITROSTAT) 0.4 MG SL tablet Place 0.4 mg under the tongue every 5 (five) minutes as needed for chest pain.    Historical Provider, MD    Physical Exam: Vitals:   05/27/16 0700 05/27/16 0730 05/27/16 0816 05/27/16 0830  BP: 97/75 112/78 96/73 107/70  Pulse: 106 103 102 103  Resp: 23   24  Temp:      TempSrc:      SpO2: 97% 97% 98% 97%      Constitutional: NAD, calm, comfortable Eyes: PERRL, lids and conjunctivae normal ENMT: Mucous membranes are dry. Posterior pharynx clear of any exudate or lesions.Normal dentition.  Neck:  normal, supple, no masses, no thyromegaly Respiratory: clear to auscultation bilaterally with somewhat diminished especially in the bases, no wheezing, no crackles. Normal respiratory effort. No accessory muscle use. RA Cardiovascular: Regular rate and rhythm, no murmurs / rubs / gallops. No extremity edema. 2+ pedal pulses. No carotid bruits.  Abdomen: no tenderness, no masses palpated. No hepatosplenomegaly. Bowel sounds positive.  Musculoskeletal: no clubbing / cyanosis. No joint deformity upper and lower extremities. Good ROM, no contractures. Normal muscle tone.  Genitourinary: #18 Foley catheter in place draining cloudy yellow-colored urine the bedside bag, noted extensive well-healed excoriation to the glans penis-scrotal sac is reddened in color but not edematous and patient is not reporting pain Skin: patient has multiple ulcerated areas to the bilateral lower extremities as well as some similar areas that are smaller in nature to the upper extremities and upper chest wall and has been previously diagnosed with bullous pemphigoid-there are minor periwound erythematous changes but no significant areas of erythema induration and no purulent drainage Neurologic: CN 2-12 grossly intact. Sensation intact, DTR normal. Strength 5/5 x all 4 extremities.  Psychiatric: Normal judgment and insight. Alert and oriented x 3. Normal mood.    Labs on Admission: I have personally reviewed following labs and imaging studies  CBC:  Recent Labs Lab 05/27/16 0633  WBC 29.3*  HGB 13.6  HCT 40.1  MCV 88.9  PLT 767   Basic Metabolic Panel:  Recent Labs Lab 05/27/16 0633  NA 127*  K 4.9  CL 87*  CO2 26  GLUCOSE 587*  BUN 21*  CREATININE 1.12  CALCIUM 8.8*   GFR: Estimated Creatinine Clearance: 99 mL/min (by C-G formula based on SCr of 1.12 mg/dL). Liver Function Tests:  Recent Labs Lab 05/27/16 0720  AST 15  ALT 20  ALKPHOS 138*  BILITOT 0.8  PROT 7.4  ALBUMIN 2.8*   No results  for input(s): LIPASE, AMYLASE in the last 168 hours. No results for input(s): AMMONIA in the last 168 hours. Coagulation Profile: No results for input(s): INR, PROTIME in the last 168 hours. Cardiac Enzymes: No results for input(s): CKTOTAL, CKMB, CKMBINDEX, TROPONINI in the last 168 hours. BNP (last 3 results) No results for input(s): PROBNP in the last 8760 hours. HbA1C: No results for input(s): HGBA1C in the last 72 hours. CBG:  Recent Labs Lab 05/27/16 1024  GLUCAP 480*   Lipid Profile: No results for input(s): CHOL, HDL, LDLCALC, TRIG, CHOLHDL, LDLDIRECT in the last 72 hours. Thyroid Function Tests: No results for input(s): TSH, T4TOTAL, FREET4, T3FREE, THYROIDAB in the last 72 hours. Anemia Panel: No results for input(s): VITAMINB12, FOLATE, FERRITIN, TIBC, IRON, RETICCTPCT in the last 72 hours. Urine analysis:    Component Value Date/Time   COLORURINE YELLOW 01/11/2016 1455   APPEARANCEUR CLOUDY (A) 01/11/2016 1455   LABSPEC 1.015 01/11/2016 1455   PHURINE 6.0 01/11/2016 1455   GLUCOSEU NEGATIVE 01/11/2016 1455   HGBUR MODERATE (A) 01/11/2016 1455   BILIRUBINUR NEGATIVE 01/11/2016 1455   KETONESUR NEGATIVE 01/11/2016 1455   PROTEINUR 100 (A) 01/11/2016 1455   UROBILINOGEN 1.0 07/01/2015 1455   NITRITE NEGATIVE 01/11/2016 1455   LEUKOCYTESUR MODERATE (A) 01/11/2016 1455   Sepsis Labs: @LABRCNTIP (procalcitonin:4,lacticidven:4) )No results found for this or any previous visit (from the past 240 hour(s)).   Radiological Exams on Admission: Dg Chest Portable 1 View  Result Date: 05/27/2016 CLINICAL DATA:  Right side chest pain and SOB x this morning. Hx of HTN, diabetes, CAD, CHF. EXAM: PORTABLE CHEST 1 VIEW COMPARISON:  01/11/2016 FINDINGS: Normal cardiac silhouette. There are low lung volumes with bibasilar atelectasis not changed. No pneumothorax. IMPRESSION: Low lung volumes and bibasilar atelectasis.  No change. Electronically Signed   By: Suzy Bouchard M.D.    On: 05/27/2016 08:37    EKG: (Independently reviewed) Sinus tachycardia with ventricular rate 107 bpm, QTC 474 ms, incomplete left bundle-branch block, no definitive acute ischemic changes.  Assessment/Plan Principal Problem:   Sepsis Unspecified Organism   -Patient presented initially with chest pain but later found to have sepsis physiology as follows: Leukocytosis 29,300, hypotension systolic blood pressure 85 with normal MAP, elevated lactic acid of 2.44, significant hyperglycemia, and tachycardia -Etiology unclear but differential includes urinary tract versus skin -It appears patient may or recently been on doxycycline for presumed lower extremity cellulitis -Empiric broad-spectrum antibiotics given in the ER: Vancomycin and Zosyn and will continue -Blood cultures obtained in the ER -Urinalysis and culture pending -Given 4 L fluid in the ER will continue IV fluids at 100 per hour  -Given IV Cortef 100 mg IV 1 in ER and we'll continue stress dose steroids 50 mg IV every 8 hours with next dose due for p.m.  -Check serum cortisol, Procalcitonin, PT/INR and PTT  -Repeat chest x-ray in a.m. in the event pneumonia process.  Active Problems:   Diabetes mellitus due to underlying condition, uncontrolled, with hyperosmolarity without coma, with long-term current use of insulin  -7/17 HgA1C=8.7 -Found to have CBG greater than 500 with sodium 127 (pseudohyponatremia) -Continue hydration as above -Hemoglobin A1c -Suspect infectious sources precipitating etiology -Has previously been on NPH insulin according to nursing home records he is on combined insulin glargine-Lixisenatide 100-33 units at bedtime   Hyperosmolar Hyperglycemic Nonketotic Syndrome (HHNS) vs early DKA -Anion gap is normal early DKA?  -Start Glucostabilizer Protocol -once CBG<200 for 4 measurements resume the folowing -While acutely ill have transitioned to  insulin glargine only 10 units twice a day with sliding scale  insulin resistant coverage every 4 hours with Resistant SSI     CAD in native artery/ Chest pain -Patient underwent cardiac catheterization in 2016 and was found to have residual disease without any areas of minimal to PCI therefore focus was on medical therapies -Had typical chest pain prior to arrival that initially was responsive to nitroglycerin with second recurrence not responsive therefore will consider as possibly ischemic -Cycle troponin -Echocardiogram -Initial EKG reassuring -Continue preadmission baby aspirin -History of prior DVTs so check d-dimer especially in setting of tachycardia to rule out possibility of PE -Given elevated glucose and history of GERD patient may be experiencing transient gastroparesis from hyperglycemia and this may explain chest pain -Blood pressure suboptimal so preadmission depressor on hold    Bullous pemphigus -Newly diagnosed in August -On prednisone 40 mg at nursing facility-stress dose steroids for now -Medications reviewed; med rec shows that the patient takes Rheumatrex but I'm unable to locate this on [facility] from nursing facility [facility]'s to be clarified      Chronic indwelling Foley catheter/  Neurogenic bladder -Patient has had documented Foley catheter in place at least since 2013 -Patient has a #18 in place and is now leaking large volumes of urine around the catheter and has a chronic excoriation to the glans penis -At this juncture patient may benefit from transition to a suprapubic catheter; he reports has previously seen urologist in my review with the EMR I'm unable to locate name of said urologist -On previous admission patient was treated for epididymitis/orchitis-scrotal skin is slightly reddened but this is in setting of leaking urine-is no edema and patient is not reporting any scrotal pain    History of DVT (deep vein thrombosis) -Not on chronic anticoagulation -As above regarding checking d-dimer and ruling out possibility of PE as  etiology to patient's chest pain and tachycardia    Pressure ulcer -Previous admission patient had documented bilateral buttock decubiti and he is complaining that these are still present -Because of patient's large body habitus and size of the ER stretcher on was unable to roll patient to examine these areas therefore requested a bariatric bed for upstairs -WOC RN to evaluate pressure ulcers as well as ulcers on legs from bullous pemphigus and to make recommendations regarding skin protection in setting of leaking Foley catheter    Morbid obesity/OSA on CPAP -Bedbound and becomes orthostatic with attempts to mobilize -Bariatric bed -Nocturnal CPAP     Chronic diastolic CHF (congestive heart failure) / HTN -Last echo was performed over 2016: Reserved LV function with grade 1 diastolic dysfunction, heavily calcified aortic valve with mild stenosis -Given presentation with sepsis physiology and apparent dehydration preadmission Demadex on hold -Due to suboptimal blood pressure Norvasc also on hold    Hyperlipidemia LDL goal <70 -Not on statin prior to admission    H/O: CVA (cerebrovascular accident)      DVT prophylaxis: Lovenox  Code Status: Previously was DO NOT RESUSCITATE but currently wants to be full recessive rotation  Family Communication: No family at bedside  Disposition Plan: Anticipate will discharge back to preadmission nursing facility  Consults called: None     ELLIS,ALLISON L. ANP-BC [facility] Pager [phone]   If 7PM-7AM, please contact night-coverage [email or web address] Password [email or web address]  05/27/2016, 10:45 AM  Care during the described time interval was provided by ANP ELLIS,ALLISON and myself .  I have reviewed this patient's available data, including medical history, events of note,  physical examination, and all test results as part of my evaluation. I have personally reviewed and interpreted all radiology studies.

## 2016-05-27 NOTE — Progress Notes (Signed)
Patient noncompliant with care. Refused to allow [place] to transfer him to an air mattress to protect his already damaged skin. Further refusing to have his blood sugar taken to monitor for glucose stabilizer. States that we gave him medicine to make his sugar high. Attempted to teach him the danger of a high sugar, but continues to refuse. Paged MD. Continuing to monitor.   Milford Cage, RN

## 2016-05-27 NOTE — ED Triage Notes (Signed)
Pt from [place]. Pt c/o of chest pain 10/10 right sided. Was given 3 nitro at facility with minimal relief. 324 ASA en route. CBG read high on the meter.

## 2016-05-27 NOTE — ED Notes (Signed)
Give 25 units novolog for CBG 480 per Erin Hearing NP

## 2016-05-28 ENCOUNTER — Inpatient Hospital Stay (HOSPITAL_COMMUNITY): Payer: Medicare Other

## 2016-05-28 ENCOUNTER — Encounter (HOSPITAL_COMMUNITY): Payer: Self-pay | Admitting: Radiology

## 2016-05-28 DIAGNOSIS — R079 Chest pain, unspecified: Secondary | ICD-10-CM

## 2016-05-28 DIAGNOSIS — E119 Type 2 diabetes mellitus without complications: Secondary | ICD-10-CM

## 2016-05-28 DIAGNOSIS — B9562 Methicillin resistant Staphylococcus aureus infection as the cause of diseases classified elsewhere: Secondary | ICD-10-CM

## 2016-05-28 DIAGNOSIS — L03113 Cellulitis of right upper limb: Secondary | ICD-10-CM

## 2016-05-28 DIAGNOSIS — Z7984 Long term (current) use of oral hypoglycemic drugs: Secondary | ICD-10-CM

## 2016-05-28 DIAGNOSIS — R0781 Pleurodynia: Secondary | ICD-10-CM

## 2016-05-28 LAB — BLOOD CULTURE ID PANEL (REFLEXED)
Acinetobacter baumannii: NOT DETECTED
CANDIDA GLABRATA: NOT DETECTED
CANDIDA KRUSEI: NOT DETECTED
CANDIDA PARAPSILOSIS: NOT DETECTED
CANDIDA TROPICALIS: NOT DETECTED
Candida albicans: NOT DETECTED
Carbapenem resistance: NOT DETECTED
ESCHERICHIA COLI: NOT DETECTED
Enterobacter cloacae complex: NOT DETECTED
Enterobacteriaceae species: NOT DETECTED
Enterococcus species: NOT DETECTED
Haemophilus influenzae: NOT DETECTED
KLEBSIELLA OXYTOCA: NOT DETECTED
KLEBSIELLA PNEUMONIAE: NOT DETECTED
LISTERIA MONOCYTOGENES: NOT DETECTED
Methicillin resistance: DETECTED — AB
NEISSERIA MENINGITIDIS: NOT DETECTED
PROTEUS SPECIES: NOT DETECTED
Pseudomonas aeruginosa: NOT DETECTED
SERRATIA MARCESCENS: NOT DETECTED
STREPTOCOCCUS SPECIES: NOT DETECTED
Staphylococcus aureus (BCID): DETECTED — AB
Staphylococcus species: DETECTED — AB
Streptococcus agalactiae: NOT DETECTED
Streptococcus pneumoniae: NOT DETECTED
Streptococcus pyogenes: NOT DETECTED
Vancomycin resistance: NOT DETECTED

## 2016-05-28 LAB — GLUCOSE, CAPILLARY
GLUCOSE-CAPILLARY: 133 mg/dL — AB (ref 65–99)
GLUCOSE-CAPILLARY: 152 mg/dL — AB (ref 65–99)
GLUCOSE-CAPILLARY: 155 mg/dL — AB (ref 65–99)
GLUCOSE-CAPILLARY: 166 mg/dL — AB (ref 65–99)
GLUCOSE-CAPILLARY: 181 mg/dL — AB (ref 65–99)
GLUCOSE-CAPILLARY: 253 mg/dL — AB (ref 65–99)
GLUCOSE-CAPILLARY: 299 mg/dL — AB (ref 65–99)
Glucose-Capillary: 158 mg/dL — ABNORMAL HIGH (ref 65–99)
Glucose-Capillary: 156 mg/dL — ABNORMAL HIGH (ref 65–99)
Glucose-Capillary: 145 mg/dL — ABNORMAL HIGH (ref 65–99)
Glucose-Capillary: 141 mg/dL — ABNORMAL HIGH (ref 65–99)
Glucose-Capillary: 153 mg/dL — ABNORMAL HIGH (ref 65–99)
Glucose-Capillary: 168 mg/dL — ABNORMAL HIGH (ref 65–99)
Glucose-Capillary: 160 mg/dL — ABNORMAL HIGH (ref 65–99)
Glucose-Capillary: 150 mg/dL — ABNORMAL HIGH (ref 65–99)
Glucose-Capillary: 239 mg/dL — ABNORMAL HIGH (ref 65–99)
Glucose-Capillary: 342 mg/dL — ABNORMAL HIGH (ref 65–99)

## 2016-05-28 LAB — URINE CULTURE

## 2016-05-28 LAB — HEMOGLOBIN A1C
HEMOGLOBIN A1C: 11.7 % — AB (ref 4.8–5.6)
MEAN PLASMA GLUCOSE: 289 mg/dL

## 2016-05-28 LAB — ECHOCARDIOGRAM COMPLETE

## 2016-05-28 MED ORDER — ACETAMINOPHEN 325 MG PO TABS: 650.0000 mg | ORAL_TABLET | ORAL | Status: DC | PRN

## 2016-05-28 MED ORDER — INSULIN GLARGINE 100 UNIT/ML ~~LOC~~ SOLN
20.0000 [IU] | Freq: Every day | SUBCUTANEOUS | Status: DC
  Administered 2016-05-28: 20 [IU] via SUBCUTANEOUS
  Filled 2016-05-28 (×3): qty 0.2

## 2016-05-28 MED ORDER — SILVER SULFADIAZINE 1 % EX CREA
TOPICAL_CREAM | Freq: Every day | CUTANEOUS | Status: DC
  Administered 2016-05-28 – 2016-05-29 (×2): via TOPICAL
  Administered 2016-05-30: 1 via TOPICAL
  Administered 2016-05-31 – 2016-06-02 (×3): via TOPICAL
  Filled 2016-05-28: qty 85

## 2016-05-28 MED ORDER — INSULIN ASPART 100 UNIT/ML ~~LOC~~ SOLN
0.0000 [IU] | Freq: Every day | SUBCUTANEOUS | Status: DC
  Administered 2016-05-28 – 2016-05-29 (×2): 5 [IU] via SUBCUTANEOUS
  Administered 2016-05-30: 4 [IU] via SUBCUTANEOUS

## 2016-05-28 MED ORDER — INSULIN ASPART 100 UNIT/ML ~~LOC~~ SOLN
0.0000 [IU] | Freq: Three times a day (TID) | SUBCUTANEOUS | Status: DC
  Administered 2016-05-29: 20 [IU] via SUBCUTANEOUS
  Administered 2016-05-29 (×2): 15 [IU] via SUBCUTANEOUS
  Administered 2016-05-30 (×2): 20 [IU] via SUBCUTANEOUS
  Administered 2016-05-30: 15 [IU] via SUBCUTANEOUS
  Administered 2016-05-31 (×3): 20 [IU] via SUBCUTANEOUS
  Administered 2016-06-01: 15 [IU] via SUBCUTANEOUS
  Administered 2016-06-01: 7 [IU] via SUBCUTANEOUS
  Administered 2016-06-01: 11 [IU] via SUBCUTANEOUS
  Administered 2016-06-02: 4 [IU] via SUBCUTANEOUS

## 2016-05-28 MED ORDER — SODIUM CHLORIDE 0.9 % IV BOLUS (SEPSIS)
250.0000 mL | Freq: Once | INTRAVENOUS | Status: AC
Start: 2016-05-28 — End: 2016-05-28
  Administered 2016-05-28: 250 mL via INTRAVENOUS

## 2016-05-28 MED ORDER — IOPAMIDOL (ISOVUE-300) INJECTION 61%
INTRAVENOUS | Status: AC
  Administered 2016-05-28: 80 mL
  Filled 2016-05-28: qty 100

## 2016-05-28 MED ORDER — HYDROCORTISONE NA SUCCINATE PF 100 MG IJ SOLR
50.0000 mg | Freq: Two times a day (BID) | INTRAMUSCULAR | Status: DC
Start: 2016-05-29 — End: 2016-05-30
  Administered 2016-05-29 – 2016-05-30 (×3): 50 mg via INTRAVENOUS
  Filled 2016-05-28 (×3): qty 2

## 2016-05-28 MED ORDER — PERFLUTREN LIPID MICROSPHERE
1.0000 mL | INTRAVENOUS | Status: DC | PRN
  Filled 2016-05-28: qty 10

## 2016-05-28 MED ORDER — SODIUM CHLORIDE 0.9 % IV SOLN
INTRAVENOUS | Status: DC
  Filled 2016-05-28: qty 2.5

## 2016-05-28 NOTE — Progress Notes (Signed)
[place] TEAM 1 - Stepdown/ICU TEAM  [name]  ION:[id number] DOB: 1945-11-30 DOA: 05/27/2016 PCP: Irven Shelling, MD    Brief Narrative:   70 y.o. 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 He awakened with chest pressure.  The nursing facility gave a total of 3 nitroglycerin which initially resolved his pain. He went back to sleep. He later awakened with the same pain and additional nitroglycerin did not improve his symptoms.   Upon arrival to the ER he was found to have a white count of 29,300 with a glucose of 587 and a blood pressure of 85/56. Lactic acid was 2.44. He was started on stress dose steroids and given multiple IV fluid challenges.   Subjective: The pt is feeling much better in general, but his chest wall pain persists.  He describes a R sided pressure type chest wall pain which is worse w/ deep breaths or movement, and is accompanied by a feeling of fullness in his R supraclavicular area.  He denies n/v, abdom pain, or sob.    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 - on prednisone 40 mg - reports improvement on steroids - multiple scattered crusted/scabbed wounds - no active/acute bullae    Chronic indwelling Foley catheter/  Neurogenic bladder -in place at least since 2013 - leaking large volumes of urine around the catheter and has a chronic excoriation to the glans penis  History of DVT -Not on chronic anticoagulation - d-dimer not elevated   Moisture associated skin damage B Buttocks -wound care as per [facility] RN  Morbid obesity /OSA on CPAP -Bedbound and becomes orthostatic with attempts to mobilize - cont nocturnal CPAP  Chronic diastolic CHF / HTN -follow daily weights and strict Is/Os  Hyperlipidemia  -Not on statin prior to admission  Hx CVA    Morbid obesity   DVT prophylaxis: lovenox  Code Status: FULL CODE Family Communication: no family present at time of exam  Disposition Plan: SDY  Consultants:  none  Procedures: none  Antimicrobials:  Zosyn 9/17 > Vancomycin 9/17 >   Objective: Blood pressure 116/79, pulse (!) 113, temperature 98.2 F (36.8 C), temperature source Oral, resp. rate (!) 21, SpO2 95 %.  Intake/Output Summary (Last 24 hours) at 05/28/16 1522 Last data filed at 05/28/16 0600  Gross per 24 hour  Intake           2369.3 ml  Output              550 ml  Net  1819.3 ml   There were no vitals filed for this visit.  Examination: General: No acute respiratory distress Lungs: Clear to auscultation bilaterally without wheezes or crackles Cardiovascular: Regular rate and rhythm without murmur gallop or rub normal S1 and S2 Abdomen: Nontender, nondistended, soft, bowel sounds positive, no rebound, no ascites, no appreciable mass Extremities: No significant cyanosis, clubbing, or edema bilateral lower extremities  CBC:  Recent Labs Lab 05/27/16 0633  WBC 29.3*  HGB 13.6  HCT 40.1  MCV 88.9  PLT 094   Basic Metabolic Panel:  Recent  Labs Lab 05/27/16 0633  NA 127*  K 4.9  CL 87*  CO2 26  GLUCOSE 587*  BUN 21*  CREATININE 1.12  CALCIUM 8.8*   GFR: Estimated Creatinine Clearance: 99 mL/min (by C-G formula based on SCr of 1.12 mg/dL).  Liver Function Tests:  Recent Labs Lab 05/27/16 0720  AST 15  ALT 20  ALKPHOS 138*  BILITOT 0.8  PROT 7.4  ALBUMIN 2.8*    Coagulation Profile:  Recent Labs Lab 05/27/16 1107  INR 1.22    Cardiac Enzymes:  Recent Labs Lab 05/27/16 1107 05/27/16 1555 05/27/16 2209  TROPONINI 0.11* <0.03 <0.03    HbA1C: Hemoglobin A1C  Date/Time Value Ref Range Status  03/26/2016 8.7  Final  12/29/2015 8.8  Final   Hgb A1c MFr Bld  Date/Time Value Ref Range Status  05/27/2016 11:07 AM 11.7 (H) 4.8 - 5.6 % Final    Comment:    (NOTE)         Pre-diabetes: 5.7 - 6.4         Diabetes: >6.4         Glycemic control for adults with diabetes: <7.0     CBG:  Recent Labs Lab 05/28/16 0612 05/28/16 0732 05/28/16 0953 05/28/16 1109 05/28/16 1303  GLUCAP 153* 168* 160* 152* 150*    Recent Results (from the past 240 hour(s))  Blood Culture (routine x 2)     Status: Abnormal (Preliminary result)   Collection Time: 05/27/16  8:08 AM  Result Value Ref Range Status   Specimen Description BLOOD RIGHT UPPER ARM  Final   Special Requests BOTTLES DRAWN AEROBIC AND ANAEROBIC  5CC  Final   Culture  Setup Time   Final    IN BOTH AEROBIC AND ANAEROBIC BOTTLES GRAM POSITIVE COCCI IN CLUSTERS CRITICAL RESULT CALLED TO, READ BACK BY AND VERIFIED WITH: TO GABBOTT(PHARD) BY TCLEVELAND AT 1:59AM    Culture STAPHYLOCOCCUS AUREUS (A)  Final   Report Status PENDING  Incomplete  Blood Culture ID Panel (Reflexed)     Status: Abnormal   Collection Time: 05/27/16  8:08 AM  Result Value Ref Range Status   Enterococcus species NOT DETECTED NOT DETECTED Final   Vancomycin resistance NOT DETECTED NOT DETECTED Final   Listeria monocytogenes NOT DETECTED NOT DETECTED Final    Staphylococcus species DETECTED (A) NOT DETECTED Final    Comment: CRITICAL RESULT CALLED TO, READ BACK BY AND VERIFIED WITH: TO GABBOTT(PHARD) BY TCLEVELAND AT 1:59AM    Staphylococcus aureus DETECTED (A) NOT DETECTED Final    Comment: CRITICAL RESULT CALLED TO, READ BACK BY AND VERIFIED WITH: TO GABBOTT(PHARD) BY TCLEVELAND AT 1:59AM    Methicillin resistance DETECTED (A) NOT DETECTED Final    Comment: CRITICAL RESULT CALLED TO, READ BACK BY AND VERIFIED WITH: TO GABBOTT(PHARD) BY TCLEVELAND AT 2:06    Streptococcus species NOT DETECTED NOT DETECTED Final   Streptococcus agalactiae NOT DETECTED NOT DETECTED Final  Streptococcus pneumoniae NOT DETECTED NOT DETECTED Final   Streptococcus pyogenes NOT DETECTED NOT DETECTED Final   Acinetobacter baumannii NOT DETECTED NOT DETECTED Final   Enterobacteriaceae species NOT DETECTED NOT DETECTED Final   Enterobacter cloacae complex NOT DETECTED NOT DETECTED Final   Escherichia coli NOT DETECTED NOT DETECTED Final   Klebsiella oxytoca NOT DETECTED NOT DETECTED Final   Klebsiella pneumoniae NOT DETECTED NOT DETECTED Final   Proteus species NOT DETECTED NOT DETECTED Final   Serratia marcescens NOT DETECTED NOT DETECTED Final   Carbapenem resistance NOT DETECTED NOT DETECTED Final   Haemophilus influenzae NOT DETECTED NOT DETECTED Final   Neisseria meningitidis NOT DETECTED NOT DETECTED Final   Pseudomonas aeruginosa NOT DETECTED NOT DETECTED Final   Candida albicans NOT DETECTED NOT DETECTED Final   Candida glabrata NOT DETECTED NOT DETECTED Final   Candida krusei NOT DETECTED NOT DETECTED Final   Candida parapsilosis NOT DETECTED NOT DETECTED Final   Candida tropicalis NOT DETECTED NOT DETECTED Final  Urine culture     Status: Abnormal   Collection Time: 05/27/16 10:45 AM  Result Value Ref Range Status   Specimen Description URINE, CATHETERIZED  Final   Special Requests NONE  Final   Culture MULTIPLE SPECIES PRESENT, SUGGEST  RECOLLECTION (A)  Final   Report Status 05/28/2016 FINAL  Final  MRSA PCR Screening     Status: Abnormal   Collection Time: 05/27/16 12:48 PM  Result Value Ref Range Status   MRSA by PCR POSITIVE (A) NEGATIVE Final    Comment:        The GeneXpert MRSA Assay (FDA approved for NASAL specimens only), is one component of a comprehensive MRSA colonization surveillance program. It is not intended to diagnose MRSA infection nor to guide or monitor treatment for MRSA infections. RESULT CALLED TO, READ BACK BY AND VERIFIED WITH: S VIVERITO,RN AT 1618 05/27/16 BY L BENFIELD      Scheduled Meds: . aspirin EC  81 mg Oral Daily  . Chlorhexidine Gluconate Cloth  6 each Topical Q0600  . enoxaparin (LOVENOX) injection  40 mg Subcutaneous Q24H  . escitalopram  10 mg Oral Daily  . folic acid  1 mg Oral Daily  . gabapentin  100 mg Oral BID  . hydrocortisone sod succinate (SOLU-CORTEF) inj  50 mg Intravenous Q8H  . hydrOXYzine  25 mg Oral QID  . insulin regular  0-10 Units Intravenous TID WC  . magnesium oxide  400 mg Oral Daily  . mupirocin ointment  1 application Nasal BID  . rOPINIRole  0.5 mg Oral QHS  . senna-docusate  2 tablet Oral QHS  . silver sulfADIAZINE   Topical Daily  . traZODone  25 mg Oral QHS  . vancomycin  1,500 mg Intravenous Q12H   Continuous Infusions: . sodium chloride Stopped (05/27/16 1900)  . sodium chloride Stopped (05/27/16 2154)  . dextrose 5 % and 0.45% NaCl 50 mL/hr at 05/28/16 0600  . insulin (NOVOLIN-R) infusion 5.3 Units/hr (05/28/16 1414)     LOS: 1 day   Cherene Altes, MD [facility] Office  [phone] Pager - Text Page per Amion as per below:  On-Call/Text Page:      [email or web address]      password [email or web address]  If 7PM-7AM, please contact night-coverage [email or web address] Password [email or web address] 05/28/2016, 3:22 PM

## 2016-05-28 NOTE — Progress Notes (Signed)
  Echocardiogram 2D Echocardiogram has been performed.  [name] 05/28/2016, 9:05 AM

## 2016-05-28 NOTE — Consult Note (Addendum)
[facility] Nurse wound consult note Reason for Consult: Consult requested for buttocks and legs.  Pt states he was frequently incontinent of urine prior to admission and skin was constantly moist to buttocks.  He currently has a catheter to assist with containment of urine. Bilat buttocks are red, macerated, and have multiple partial and full thickness patchy areas of skin loss.  Appearance consistent with moisture associated skin damage. Afftected area approx 20X20cm. EMR states pt has been diagnosed with bullous phemhigoid; this is an autoimmune disorder which causes multiple blisters which evolve into wounds.  This is beyond [facility] scope of practice and requires topical treatment orders from a dermatologist.  Pt is unable to state what prescription cream he was prescribed for this syndrome, but he feels his skin has greatly improved since it was first diagnosed prior to admission. Scattered multiple areas of ruptured blisters and dried scabs to arms, trunk, and back, which do not require topical treatment at this time, since they are red and dry, without odor or drainage. Bilat legs and feet have a large concentrated area of dried scabs which are spread across bilat toes, feet, ankles, and calves. The scabs are black and dry, no odor or drainage.  Dressing procedure/placement/frequency: Barrier cream and antifungal powder to buttocks to protect and promote healing.  Air mattress to reduce pressure and increase airflow.  Silvadene to soften dry scabbed areas on bilat legs and feet and promote healing. Pt should obtain follow-up with dermatology after discharge for further plan of care. Please re-consult if further assistance is needed.  Thank-you,  Julien Girt MSN, [place], [place], [place], [place]

## 2016-05-28 NOTE — Progress Notes (Signed)
PHARMACY - PHYSICIAN COMMUNICATION CRITICAL VALUE ALERT - BLOOD CULTURE IDENTIFICATION (BCID)  Results for orders placed or performed during the hospital encounter of 05/27/16  Blood Culture ID Panel (Reflexed) (Collected: 05/27/2016  8:08 AM)  Result Value Ref Range   Enterococcus species NOT DETECTED NOT DETECTED   Vancomycin resistance NOT DETECTED NOT DETECTED   Listeria monocytogenes NOT DETECTED NOT DETECTED   Staphylococcus species DETECTED (A) NOT DETECTED   Staphylococcus aureus DETECTED (A) NOT DETECTED   Methicillin resistance DETECTED (A) NOT DETECTED   Streptococcus species NOT DETECTED NOT DETECTED   Streptococcus agalactiae NOT DETECTED NOT DETECTED   Streptococcus pneumoniae NOT DETECTED NOT DETECTED   Streptococcus pyogenes NOT DETECTED NOT DETECTED   Acinetobacter baumannii NOT DETECTED NOT DETECTED   Enterobacteriaceae species NOT DETECTED NOT DETECTED   Enterobacter cloacae complex NOT DETECTED NOT DETECTED   Escherichia coli NOT DETECTED NOT DETECTED   Klebsiella oxytoca NOT DETECTED NOT DETECTED   Klebsiella pneumoniae NOT DETECTED NOT DETECTED   Proteus species NOT DETECTED NOT DETECTED   Serratia marcescens NOT DETECTED NOT DETECTED   Carbapenem resistance NOT DETECTED NOT DETECTED   Haemophilus influenzae NOT DETECTED NOT DETECTED   Neisseria meningitidis NOT DETECTED NOT DETECTED   Pseudomonas aeruginosa NOT DETECTED NOT DETECTED   Candida albicans NOT DETECTED NOT DETECTED   Candida glabrata NOT DETECTED NOT DETECTED   Candida krusei NOT DETECTED NOT DETECTED   Candida parapsilosis NOT DETECTED NOT DETECTED   Candida tropicalis NOT DETECTED NOT DETECTED    Name of physician (or Provider) Contacted:   Dr. Maudie Mercury  Changes to prescribed antibiotics required:   Currently on Vancomycin.  No changes necessary at this time.  Consider d/c Zosyn  Caryl Pina 05/28/2016  3:34 AM

## 2016-05-28 NOTE — Progress Notes (Signed)
Patient refuses the use of CPAP 

## 2016-05-28 NOTE — Progress Notes (Signed)
Patient is refusing the use of CPAP at this time. RT informed patient if he changes his mind have RN contact RT. RN aware. 

## 2016-05-28 NOTE — Consult Note (Signed)
[place] [facility] Infectious [facility]  Date of Admission:  05/27/2016  Date of Consult:  05/28/2016  Reason for Consult:MRSA bacteremia Referring Physician: CHAMP  Impression/Recommendation MRSA bacteremia Cellulitis R supraclavicular fossa ln, tender.  DM2, uncontrolled  Would Recheck his BCx Stop zosyn  Check TTE Check imaging of his neck/chest Clearly needs better diabetic control.  Watch WBC.   Thank you so much for this interesting consult,   Bobby Rumpf (pager) [phone] [email or web address]  [name] is an 70 y.o. male.  HPI:  Please see resident note for complete details.  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 He received solucortef in ED.  He was felt to have cellulitis from his multiple skin changes/ulcers and has since had BCx+ for MRSA.    Past Medical History:  Diagnosis Date  . Arthritis    "hands, ankles, knees" (11/10/2014)  . Bladder spasms   . CAD (coronary artery disease)   . CAD in native artery 09/15/2015  . CHF (congestive heart failure) ([place])   . Chronic indwelling Foley catheter   . Chronic lower back pain   . Depression   . Diabetic diarrhea ([place])   . Diabetic neuropathy, painful ([place])   . DVT (deep venous thrombosis) ([facility]) 1980's   LLE  . GERD (gastroesophageal reflux disease)   . Hyperlipidemia   . Hypertension   . Neurogenic bladder   . Obese   . OSA (obstructive sleep apnea)    "they wanted me to wear a mask; I couldn't" (11/10/2014)  . Pneumonia 1999  . PONV (postoperative nausea and vomiting)   . Stroke [facility]) 10/2011; 06/2014   right hand numbness/notes 10/20/2011, pt not sure he had stroke in 2013; "right hand weaker and right face not quite right since" (11/10/2014)  . Type II diabetes mellitus ([place])     Past Surgical History:  Procedure Laterality Date  . CARDIAC CATHETERIZATION N/A 05/02/2015   Procedure: Left  Heart Cath and Coronary Angiography;  Surgeon: Lorretta Harp, MD;  Location: [place] CV LAB;  Service: Cardiovascular;  Laterality: N/A;  . CARDIAC CATHETERIZATION N/A 05/04/2015   Procedure: Left Heart Cath and Coronary Angiography;  Surgeon: Wellington Hampshire, MD;  Location: [place] CV LAB;  Service: Cardiovascular;  Laterality: N/A;  . CARPAL TUNNEL RELEASE Bilateral ~ 2003-2004  . CHOLECYSTECTOMY OPEN  1970's?  Marland Kitchen GASTROPLASTY    . JOINT REPLACEMENT    . LEFT HEART CATHETERIZATION WITH CORONARY ANGIOGRAM N/A 10/24/2011   Procedure: LEFT HEART CATHETERIZATION WITH CORONARY ANGIOGRAM;  Surgeon: Candee Furbish, MD;  Location: [facility] CATH LAB;  Service: Cardiovascular;  Laterality: N/A;  right radial artery approach  . TOTAL KNEE ARTHROPLASTY Right 1990's     Allergies  Allergen Reactions  . Ace Inhibitors Swelling    Pt tolerates lisinopril  . Lipitor [Atorvastatin Calcium] Swelling  . Metformin And Related Swelling  . Cefadroxil Hives  . Cephalexin Hives  . Morphine And Related Other (See Comments)    Sweating, feels like is "in rocky boat."  . Robaxin [Methocarbamol] Other (See Comments)    Feels like he is shaky    Medications:  Scheduled: . aspirin EC  81 mg Oral Daily  . Chlorhexidine Gluconate Cloth  6 each Topical Q0600  . enoxaparin (LOVENOX) injection  40 mg Subcutaneous Q24H  . escitalopram  10 mg Oral Daily  . folic acid  1 mg Oral Daily  . gabapentin  100 mg Oral BID  . hydrocortisone sod succinate (SOLU-CORTEF) inj  50 mg Intravenous Q8H  . hydrOXYzine  25 mg Oral QID  . insulin regular  0-10 Units Intravenous TID WC  . magnesium oxide  400 mg Oral Daily  . mupirocin ointment  1 application Nasal BID  . rOPINIRole  0.5 mg Oral QHS  . senna-docusate  2 tablet Oral QHS  . traZODone  25 mg Oral QHS  . vancomycin  1,500 mg Intravenous Q12H    Abtx:  Anti-infectives    Start     Dose/Rate Route Frequency Ordered Stop   05/27/16 2000  vancomycin (VANCOCIN)  1,500 mg in sodium chloride 0.9 % 500 mL IVPB     1,500 mg 250 mL/hr over 120 Minutes Intravenous Every 12 hours 05/27/16 0736     05/27/16 1400  piperacillin-tazobactam (ZOSYN) IVPB 3.375 g  Status:  Discontinued     3.375 g 12.5 mL/hr over 240 Minutes Intravenous Every 8 hours 05/27/16 0753 05/28/16 1042   05/27/16 0830  vancomycin (VANCOCIN) 2,000 mg in sodium chloride 0.9 % 500 mL IVPB     2,000 mg 250 mL/hr over 120 Minutes Intravenous  Once 05/27/16 0736 05/27/16 1123   05/27/16 0745  piperacillin-tazobactam (ZOSYN) IVPB 3.375 g     3.375 g 100 mL/hr over 30 Minutes Intravenous STAT 05/27/16 0736 05/27/16 0839      Total days of antibiotics: 0 vanco/zosyn          Social History:  reports that he quit smoking about 39 years ago. His smoking use included Cigarettes. He has a 40.00 pack-year smoking history. He has never used smokeless tobacco. He reports that he does not drink alcohol or use drugs.  Family History  Problem Relation Age of Onset  . Diabetes type I Father   . Hypertension Mother   . Cancer Brother      Testicular Cancer  . Cancer Brother     Leukemia    General ROS: c/o poor vision, tendernss in R upper neck/chest, lethargic. see HPI.   He does not know his A1C  Blood pressure 95/68, pulse (!) 113, temperature 98.7 F (37.1 C), temperature source Axillary, resp. rate 17, SpO2 99 %. General appearance: fatigued and no distress Eyes: negative findings: pupils equal, round, reactive to light and accomodation Throat: abnormal findings: dry, no thrush.  Neck: swelling tenderness in R supraclavicular fossa.  Lungs: clear to auscultation bilaterally Chest wall: multiple scabbed lesions Heart: regular rate and rhythm Abdomen: normal findings: bowel sounds normal and soft, non-tender Extremities: edema 2+ BLE Skin: multiple crusted lesions, some purulent, some tender.    Results for orders placed or performed during the hospital encounter of 05/27/16 (from  the past 48 hour(s))  Basic metabolic panel     Status: Abnormal   Collection Time: 05/27/16  6:33 AM  Result Value Ref Range   Sodium 127 (L) 135 - 145 mmol/L   Potassium 4.9 3.5 - 5.1 mmol/L   Chloride 87 (L) 101 - 111 mmol/L   CO2 26 22 - 32 mmol/L   Glucose, Bld 587 (HH) 65 - 99 mg/dL    Comment: CRITICAL RESULT CALLED TO, READ BACK BY AND VERIFIED WITH: ROWE M,RN 05/27/16 0721 WAYK    BUN 21 (H) 6 - 20 mg/dL   Creatinine, Ser 1.12 0.61 - 1.24 mg/dL   Calcium 8.8 (L) 8.9 - 10.3 mg/dL   GFR calc non Af Amer >  60 >60 mL/min   GFR calc Af Amer >60 >60 mL/min    Comment: (NOTE) The eGFR has been calculated using the CKD EPI equation. This calculation has not been validated in all clinical situations. eGFR's persistently <60 mL/min signify possible Chronic Kidney Disease.    Anion gap 14 5 - 15  CBC     Status: Abnormal   Collection Time: 05/27/16  6:33 AM  Result Value Ref Range   WBC 29.3 (H) 4.0 - 10.5 K/uL    Comment: REPEATED TO VERIFY   RBC 4.51 4.22 - 5.81 MIL/uL   Hemoglobin 13.6 13.0 - 17.0 g/dL   HCT 40.1 39.0 - 52.0 %   MCV 88.9 78.0 - 100.0 fL   MCH 30.2 26.0 - 34.0 pg   MCHC 33.9 30.0 - 36.0 g/dL   RDW 12.9 11.5 - 15.5 %   Platelets 227 150 - 400 K/uL  I-stat troponin, ED     Status: None   Collection Time: 05/27/16  6:48 AM  Result Value Ref Range   Troponin i, poc 0.00 0.00 - 0.08 ng/mL   Comment 3            Comment: Due to the release kinetics of cTnI, a negative result within the first hours of the onset of symptoms does not rule out myocardial infarction with certainty. If myocardial infarction is still suspected, repeat the test at appropriate intervals.   Hepatic function panel     Status: Abnormal   Collection Time: 05/27/16  7:20 AM  Result Value Ref Range   Total Protein 7.4 6.5 - 8.1 g/dL   Albumin 2.8 (L) 3.5 - 5.0 g/dL   AST 15 15 - 41 U/L   ALT 20 17 - Brian U/L   Alkaline Phosphatase 138 (H) 38 - 126 U/L   Total Bilirubin 0.8 0.3 - 1.2  mg/dL   Bilirubin, Direct 0.2 0.1 - 0.5 mg/dL   Indirect Bilirubin 0.6 0.3 - 0.9 mg/dL  Brain natriuretic peptide     Status: Abnormal   Collection Time: 05/27/16  7:20 AM  Result Value Ref Range   B Natriuretic Peptide 141.6 (H) 0.0 - 100.0 pg/mL  I-Stat Venous Blood Gas, ED (order at [facility] and [facility] only)     Status: Abnormal   Collection Time: 05/27/16  7:49 AM  Result Value Ref Range   pH, Ven 7.360 7.250 - 7.430   pCO2, Ven 51.8 44.0 - 60.0 mmHg   pO2, Ven 21.0 (LL) 32.0 - 45.0 mmHg   Bicarbonate 29.3 (H) 20.0 - 28.0 mmol/L   TCO2 31 0 - 100 mmol/L   O2 Saturation 33.0 %   Acid-Base Excess 2.0 0.0 - 2.0 mmol/L   Patient temperature HIDE    Sample type VENOUS    Comment NOTIFIED PHYSICIAN   I-Stat CG4 Lactic Acid, ED  (not at  [facility])     Status: Abnormal   Collection Time: 05/27/16  7:50 AM  Result Value Ref Range   Lactic Acid, Venous 2.44 (HH) 0.5 - 1.9 mmol/L   Comment NOTIFIED PHYSICIAN   Blood Culture (routine x 2)     Status: Abnormal (Preliminary result)   Collection Time: 05/27/16  8:08 AM  Result Value Ref Range   Specimen Description BLOOD RIGHT UPPER ARM    Special Requests BOTTLES DRAWN AEROBIC AND ANAEROBIC  5CC    Culture  Setup Time      IN BOTH AEROBIC AND ANAEROBIC BOTTLES GRAM POSITIVE COCCI IN CLUSTERS CRITICAL  RESULT CALLED TO, READ BACK BY AND VERIFIED WITH: TO GABBOTT(PHARD) BY TCLEVELAND AT 1:59AM    Culture STAPHYLOCOCCUS AUREUS (A)    Report Status PENDING   Blood Culture ID Panel (Reflexed)     Status: Abnormal   Collection Time: 05/27/16  8:08 AM  Result Value Ref Range   Enterococcus species NOT DETECTED NOT DETECTED   Vancomycin resistance NOT DETECTED NOT DETECTED   Listeria monocytogenes NOT DETECTED NOT DETECTED   Staphylococcus species DETECTED (A) NOT DETECTED    Comment: CRITICAL RESULT CALLED TO, READ BACK BY AND VERIFIED WITH: TO GABBOTT(PHARD) BY TCLEVELAND AT 1:59AM    Staphylococcus aureus DETECTED (A) NOT DETECTED    Comment:  CRITICAL RESULT CALLED TO, READ BACK BY AND VERIFIED WITH: TO GABBOTT(PHARD) BY TCLEVELAND AT 1:59AM    Methicillin resistance DETECTED (A) NOT DETECTED    Comment: CRITICAL RESULT CALLED TO, READ BACK BY AND VERIFIED WITH: TO GABBOTT(PHARD) BY TCLEVELAND AT 2:06    Streptococcus species NOT DETECTED NOT DETECTED   Streptococcus agalactiae NOT DETECTED NOT DETECTED   Streptococcus pneumoniae NOT DETECTED NOT DETECTED   Streptococcus pyogenes NOT DETECTED NOT DETECTED   Acinetobacter baumannii NOT DETECTED NOT DETECTED   Enterobacteriaceae species NOT DETECTED NOT DETECTED   Enterobacter cloacae complex NOT DETECTED NOT DETECTED   Escherichia coli NOT DETECTED NOT DETECTED   Klebsiella oxytoca NOT DETECTED NOT DETECTED   Klebsiella pneumoniae NOT DETECTED NOT DETECTED   Proteus species NOT DETECTED NOT DETECTED   Serratia marcescens NOT DETECTED NOT DETECTED   Carbapenem resistance NOT DETECTED NOT DETECTED   Haemophilus influenzae NOT DETECTED NOT DETECTED   Neisseria meningitidis NOT DETECTED NOT DETECTED   Pseudomonas aeruginosa NOT DETECTED NOT DETECTED   Candida albicans NOT DETECTED NOT DETECTED   Candida glabrata NOT DETECTED NOT DETECTED   Candida krusei NOT DETECTED NOT DETECTED   Candida parapsilosis NOT DETECTED NOT DETECTED   Candida tropicalis NOT DETECTED NOT DETECTED  POC CBG, ED     Status: Abnormal   Collection Time: 05/27/16 10:24 AM  Result Value Ref Range   Glucose-Capillary 480 (H) 65 - 99 mg/dL  Urinalysis, Routine w reflex microscopic (not at [facility])     Status: Abnormal   Collection Time: 05/27/16 10:45 AM  Result Value Ref Range   Color, Urine YELLOW YELLOW   APPearance TURBID (A) CLEAR   Specific Gravity, Urine 1.030 1.005 - 1.030   pH 5.5 5.0 - 8.0   Glucose, UA >1000 (A) NEGATIVE mg/dL   Hgb urine dipstick MODERATE (A) NEGATIVE   Bilirubin Urine NEGATIVE NEGATIVE   Ketones, ur 15 (A) NEGATIVE mg/dL   Protein, ur 30 (A) NEGATIVE mg/dL   Nitrite  POSITIVE (A) NEGATIVE   Leukocytes, UA MODERATE (A) NEGATIVE  Urine culture     Status: Abnormal   Collection Time: 05/27/16 10:45 AM  Result Value Ref Range   Specimen Description URINE, CATHETERIZED    Special Requests NONE    Culture MULTIPLE SPECIES PRESENT, SUGGEST RECOLLECTION (A)    Report Status 05/28/2016 FINAL   Urine microscopic-add on     Status: Abnormal   Collection Time: 05/27/16 10:45 AM  Result Value Ref Range   Squamous Epithelial / LPF 0-5 (A) NONE SEEN   WBC, UA TOO NUMEROUS TO COUNT 0 - 5 WBC/hpf   RBC / HPF 6-30 0 - 5 RBC/hpf   Bacteria, UA MANY (A) NONE SEEN   Urine-Other YEAST PRESENT   Hemoglobin A1c     Status:  Abnormal   Collection Time: 05/27/16 11:07 AM  Result Value Ref Range   Hgb A1c MFr Bld 11.7 (H) 4.8 - 5.6 %    Comment: (NOTE)         Pre-diabetes: 5.7 - 6.4         Diabetes: >6.4         Glycemic control for adults with diabetes: <7.0    Mean Plasma Glucose 289 mg/dL    Comment: (NOTE) Performed At: [facility] [place], [place] [id number] Lindon Romp MD JA:[id number]   Troponin I (q 6hr x 3)     Status: Abnormal   Collection Time: 05/27/16 11:07 AM  Result Value Ref Range   Troponin I 0.11 (HH) <0.03 ng/mL    Comment: CRITICAL RESULT CALLED TO, READ BACK BY AND VERIFIED WITH: C ROWE,RN 1201 05/27/16 D BRADLEY   Procalcitonin     Status: None   Collection Time: 05/27/16 11:07 AM  Result Value Ref Range   Procalcitonin 0.41 ng/mL    Comment:        Interpretation: PCT (Procalcitonin) <= 0.5 ng/mL: Systemic infection (sepsis) is not likely. Local bacterial infection is possible. (NOTE)         ICU PCT Algorithm               Non ICU PCT Algorithm    ----------------------------     ------------------------------         PCT < 0.25 ng/mL                 PCT < 0.1 ng/mL     Stopping of antibiotics            Stopping of antibiotics       strongly encouraged.               strongly encouraged.     ----------------------------     ------------------------------       PCT level decrease by               PCT < 0.25 ng/mL       >= 80% from peak PCT       OR PCT 0.25 - 0.5 ng/mL          Stopping of antibiotics                                             encouraged.     Stopping of antibiotics           encouraged.    ----------------------------     ------------------------------       PCT level decrease by              PCT >= 0.25 ng/mL       < 80% from peak PCT        AND PCT >= 0.5 ng/mL            Continuin g antibiotics                                              encouraged.       Continuing antibiotics            encouraged.    ----------------------------     ------------------------------  PCT level increase compared          PCT > 0.5 ng/mL         with peak PCT AND          PCT >= 0.5 ng/mL             Escalation of antibiotics                                          strongly encouraged.      Escalation of antibiotics        strongly encouraged.   Protime-INR     Status: Abnormal   Collection Time: 05/27/16 11:07 AM  Result Value Ref Range   Prothrombin Time 15.5 (H) 11.4 - 15.2 seconds   INR 1.22   APTT     Status: None   Collection Time: 05/27/16 11:07 AM  Result Value Ref Range   aPTT 29 24 - 36 seconds  D-dimer, quantitative (not at [facility])     Status: None   Collection Time: 05/27/16 11:07 AM  Result Value Ref Range   D-Dimer, Quant 0.38 0.00 - 0.50 ug/mL-FEU    Comment: (NOTE) At the manufacturer cut-off of 0.50 ug/mL FEU, this assay has been documented to exclude PE with a sensitivity and negative predictive value of 97 to 99%.  At this time, this assay has not been approved by the FDA to exclude DVT/VTE. Results should be correlated with clinical presentation.   Cortisol     Status: None   Collection Time: 05/27/16 11:07 AM  Result Value Ref Range   Cortisol, Plasma 40.5 ug/dL    Comment: (NOTE) AM    6.7 - 22.6 ug/dL PM   <10.0       ug/dL     CBG monitoring, ED     Status: Abnormal   Collection Time: 05/27/16 11:28 AM  Result Value Ref Range   Glucose-Capillary 464 (H) 65 - 99 mg/dL  MRSA PCR Screening     Status: Abnormal   Collection Time: 05/27/16 12:48 PM  Result Value Ref Range   MRSA by PCR POSITIVE (A) NEGATIVE    Comment:        The GeneXpert MRSA Assay (FDA approved for NASAL specimens only), is one component of a comprehensive MRSA colonization surveillance program. It is not intended to diagnose MRSA infection nor to guide or monitor treatment for MRSA infections. RESULT CALLED TO, READ BACK BY AND VERIFIED WITH: S VIVERITO,RN AT 1618 05/27/16 BY L BENFIELD   Lactic acid, plasma     Status: Abnormal   Collection Time: 05/27/16 12:52 PM  Result Value Ref Range   Lactic Acid, Venous 2.7 (HH) 0.5 - 1.9 mmol/L    Comment: CRITICAL RESULT CALLED TO, READ BACK BY AND VERIFIED WITH: A AGUIRRE,RN 1400 05/27/16 D BRADLEY   Glucose, capillary     Status: Abnormal   Collection Time: 05/27/16  1:44 PM  Result Value Ref Range   Glucose-Capillary 471 (H) 65 - 99 mg/dL  Glucose, capillary     Status: Abnormal   Collection Time: 05/27/16  2:50 PM  Result Value Ref Range   Glucose-Capillary 381 (H) 65 - 99 mg/dL  Troponin I (q 6hr x 3)     Status: None   Collection Time: 05/27/16  3:55 PM  Result Value Ref Range   Troponin I <0.03 <0.03 ng/mL  Lactic acid, plasma     Status: None   Collection Time: 05/27/16  3:55 PM  Result Value Ref Range   Lactic Acid, Venous 1.9 0.5 - 1.9 mmol/L  Glucose, capillary     Status: Abnormal   Collection Time: 05/27/16  3:57 PM  Result Value Ref Range   Glucose-Capillary 388 (H) 65 - 99 mg/dL  Glucose, capillary     Status: Abnormal   Collection Time: 05/27/16  5:02 PM  Result Value Ref Range   Glucose-Capillary 382 (H) 65 - 99 mg/dL   Comment 1 Notify RN    Comment 2 Document in Chart   Glucose, capillary     Status: Abnormal   Collection Time: 05/27/16  6:32 PM  Result  Value Ref Range   Glucose-Capillary 489 (H) 65 - 99 mg/dL   Comment 1 Notify RN    Comment 2 Document in Chart   Glucose, capillary     Status: Abnormal   Collection Time: 05/27/16  7:38 PM  Result Value Ref Range   Glucose-Capillary 271 (H) 65 - 99 mg/dL  Glucose, capillary     Status: Abnormal   Collection Time: 05/27/16  8:41 PM  Result Value Ref Range   Glucose-Capillary 257 (H) 65 - 99 mg/dL  Glucose, capillary     Status: Abnormal   Collection Time: 05/27/16  9:43 PM  Result Value Ref Range   Glucose-Capillary 220 (H) 65 - 99 mg/dL  Troponin I (q 6hr x 3)     Status: None   Collection Time: 05/27/16 10:09 PM  Result Value Ref Range   Troponin I <0.03 <0.03 ng/mL  Glucose, capillary     Status: Abnormal   Collection Time: 05/27/16 10:57 PM  Result Value Ref Range   Glucose-Capillary 185 (H) 65 - 99 mg/dL  Glucose, capillary     Status: Abnormal   Collection Time: 05/28/16 12:08 AM  Result Value Ref Range   Glucose-Capillary 166 (H) 65 - 99 mg/dL   Comment 1 Notify RN   Glucose, capillary     Status: Abnormal   Collection Time: 05/28/16  1:04 AM  Result Value Ref Range   Glucose-Capillary 158 (H) 65 - 99 mg/dL  Glucose, capillary     Status: Abnormal   Collection Time: 05/28/16  2:03 AM  Result Value Ref Range   Glucose-Capillary 156 (H) 65 - 99 mg/dL  Glucose, capillary     Status: Abnormal   Collection Time: 05/28/16  3:05 AM  Result Value Ref Range   Glucose-Capillary 133 (H) 65 - 99 mg/dL  Glucose, capillary     Status: Abnormal   Collection Time: 05/28/16  4:12 AM  Result Value Ref Range   Glucose-Capillary 145 (H) 65 - 99 mg/dL  Glucose, capillary     Status: Abnormal   Collection Time: 05/28/16  5:12 AM  Result Value Ref Range   Glucose-Capillary 141 (H) 65 - 99 mg/dL  Glucose, capillary     Status: Abnormal   Collection Time: 05/28/16  6:12 AM  Result Value Ref Range   Glucose-Capillary 153 (H) 65 - 99 mg/dL   Comment 1 Notify RN   Glucose, capillary      Status: Abnormal   Collection Time: 05/28/16  7:32 AM  Result Value Ref Range   Glucose-Capillary 168 (H) 65 - 99 mg/dL  Glucose, capillary     Status: Abnormal   Collection Time: 05/28/16  9:53 AM  Result Value Ref Range   Glucose-Capillary 160 (H)  65 - 99 mg/dL      Component Value Date/Time   SDES URINE, CATHETERIZED 05/27/2016 1045   SPECREQUEST NONE 05/27/2016 1045   CULT MULTIPLE SPECIES PRESENT, SUGGEST RECOLLECTION (A) 05/27/2016 1045   REPTSTATUS 05/28/2016 FINAL 05/27/2016 1045   Dg Chest Port 1 View  Result Date: 05/28/2016 CLINICAL DATA:  Sepsis EXAM: PORTABLE CHEST 1 VIEW COMPARISON:  05/27/2016 FINDINGS: Cardiomegaly with vascular congestion. Bibasilar atelectasis, right greater than left. Mild interstitial prominence throughout the lungs could reflect mild interstitial edema. This is increased slightly since prior study. IMPRESSION: Low lung volumes with bibasilar atelectasis. Cardiomegaly with vascular congestion and possible mild interstitial edema. Electronically Signed   By: Rolm Baptise Wood.D.   On: 05/28/2016 07:41   Dg Chest Portable 1 View  Result Date: 05/27/2016 CLINICAL DATA:  Right side chest pain and SOB x this morning. Hx of HTN, diabetes, CAD, CHF. EXAM: PORTABLE CHEST 1 VIEW COMPARISON:  01/11/2016 FINDINGS: Normal cardiac silhouette. There are low lung volumes with bibasilar atelectasis not changed. No pneumothorax. IMPRESSION: Low lung volumes and bibasilar atelectasis.  No change. Electronically Signed   By: Suzy Bouchard Wood.D.   On: 05/27/2016 08:37   Recent Results (from the past 240 hour(s))  Blood Culture (routine x 2)     Status: Abnormal (Preliminary result)   Collection Time: 05/27/16  8:08 AM  Result Value Ref Range Status   Specimen Description BLOOD RIGHT UPPER ARM  Final   Special Requests BOTTLES DRAWN AEROBIC AND ANAEROBIC  5CC  Final   Culture  Setup Time   Final    IN BOTH AEROBIC AND ANAEROBIC BOTTLES GRAM POSITIVE COCCI IN  CLUSTERS CRITICAL RESULT CALLED TO, READ BACK BY AND VERIFIED WITH: TO GABBOTT(PHARD) BY TCLEVELAND AT 1:59AM    Culture STAPHYLOCOCCUS AUREUS (A)  Final   Report Status PENDING  Incomplete  Blood Culture ID Panel (Reflexed)     Status: Abnormal   Collection Time: 05/27/16  8:08 AM  Result Value Ref Range Status   Enterococcus species NOT DETECTED NOT DETECTED Final   Vancomycin resistance NOT DETECTED NOT DETECTED Final   Listeria monocytogenes NOT DETECTED NOT DETECTED Final   Staphylococcus species DETECTED (A) NOT DETECTED Final    Comment: CRITICAL RESULT CALLED TO, READ BACK BY AND VERIFIED WITH: TO GABBOTT(PHARD) BY TCLEVELAND AT 1:59AM    Staphylococcus aureus DETECTED (A) NOT DETECTED Final    Comment: CRITICAL RESULT CALLED TO, READ BACK BY AND VERIFIED WITH: TO GABBOTT(PHARD) BY TCLEVELAND AT 1:59AM    Methicillin resistance DETECTED (A) NOT DETECTED Final    Comment: CRITICAL RESULT CALLED TO, READ BACK BY AND VERIFIED WITH: TO GABBOTT(PHARD) BY TCLEVELAND AT 2:06    Streptococcus species NOT DETECTED NOT DETECTED Final   Streptococcus agalactiae NOT DETECTED NOT DETECTED Final   Streptococcus pneumoniae NOT DETECTED NOT DETECTED Final   Streptococcus pyogenes NOT DETECTED NOT DETECTED Final   Acinetobacter baumannii NOT DETECTED NOT DETECTED Final   Enterobacteriaceae species NOT DETECTED NOT DETECTED Final   Enterobacter cloacae complex NOT DETECTED NOT DETECTED Final   Escherichia coli NOT DETECTED NOT DETECTED Final   Klebsiella oxytoca NOT DETECTED NOT DETECTED Final   Klebsiella pneumoniae NOT DETECTED NOT DETECTED Final   Proteus species NOT DETECTED NOT DETECTED Final   Serratia marcescens NOT DETECTED NOT DETECTED Final   Carbapenem resistance NOT DETECTED NOT DETECTED Final   Haemophilus influenzae NOT DETECTED NOT DETECTED Final   Neisseria meningitidis NOT DETECTED NOT DETECTED Final   Pseudomonas  aeruginosa NOT DETECTED NOT DETECTED Final   Candida  albicans NOT DETECTED NOT DETECTED Final   Candida glabrata NOT DETECTED NOT DETECTED Final   Candida krusei NOT DETECTED NOT DETECTED Final   Candida parapsilosis NOT DETECTED NOT DETECTED Final   Candida tropicalis NOT DETECTED NOT DETECTED Final  Urine culture     Status: Abnormal   Collection Time: 05/27/16 10:45 AM  Result Value Ref Range Status   Specimen Description URINE, CATHETERIZED  Final   Special Requests NONE  Final   Culture MULTIPLE SPECIES PRESENT, SUGGEST RECOLLECTION (A)  Final   Report Status 05/28/2016 FINAL  Final  MRSA PCR Screening     Status: Abnormal   Collection Time: 05/27/16 12:48 PM  Result Value Ref Range Status   MRSA by PCR POSITIVE (A) NEGATIVE Final    Comment:        The GeneXpert MRSA Assay (FDA approved for NASAL specimens only), is one component of a comprehensive MRSA colonization surveillance program. It is not intended to diagnose MRSA infection nor to guide or monitor treatment for MRSA infections. RESULT CALLED TO, READ BACK BY AND VERIFIED WITH: S VIVERITO,RN AT 1618 05/27/16 BY L BENFIELD       05/28/2016, 10:47 AM     LOS: 1 day    Records and images were personally reviewed where available.

## 2016-05-28 NOTE — [place] FL2 (Signed)
[place] MEDICAID FL2 LEVEL OF CARE SCREENING TOOL     IDENTIFICATION  Patient Name: [name] Birthdate: Jun 28, 1946 Sex: male Admission Date (Current Location): 05/27/2016  [facility] and [place] Number:  Herbalist and Address:  [facility] [place], [place] [address], [place], [place] [address]      Provider Number: [id number]  Attending Physician Name and Address:  Cherene Altes, MD  Relative Name and Phone Number:       Current Level of Care: Hospital Recommended Level of Care: [place] Prior Approval Number:    Date Approved/Denied:   PASRR Number:    Discharge Plan: SNF    Current Diagnoses: Patient Active Problem List   Diagnosis Date Noted  . 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 OSA on CPAP 05/27/2016  . Chest pain 05/27/2016  . Sepsis, unspecified organism ([place])   . Hypertensive heart disease with CHF (congestive heart failure) ([place]) 04/15/2016  . Infection due to ESBL-producing Escherichia coli 01/22/2016  . Klebsiella cystitis 01/22/2016  . H/O: CVA (cerebrovascular accident) 01/22/2016  . Altered mental status   . Acute encephalopathy 01/11/2016  . Sepsis ([place]) 01/11/2016  . UTI (lower urinary tract infection) 01/11/2016  . Epididymo-orchitis 01/11/2016  . Depression   . Rash and nonspecific skin eruption 11/07/2015  . GERD (gastroesophageal reflux disease) 10/22/2015  . Restless legs 10/22/2015  . Chronic venous stasis dermatitis of both lower extremities 10/08/2015  . CAD in native artery 09/15/2015  . PVC's (premature ventricular contractions) 09/15/2015  . Sinus pause 09/14/2015  . Morbid obesity ([place]) 07/03/2015  . 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 Hyperlipidemia LDL goal <70 05/02/2015  . Abnormal nuclear stress test 05/02/2015  . Neurogenic bladder 04/26/2015  . Pressure ulcer 04/15/2015  . UTI (urinary tract infection) due to urinary indwelling  catheter ([place]) 04/14/2015  . History of DVT (deep vein thrombosis) 12/08/2014  . Chronic diastolic CHF (congestive heart failure) ([place]) 04/01/2014  . Chronic indwelling Foley catheter 12/23/2013  . NSVT (nonsustained ventricular tachycardia) ([place]) 10/26/2011  . Diabetes mellitus due to underlying condition, uncontrolled, with hyperosmolarity without coma, with long-term current use of insulin ([place]) 10/10/2011  . Diabetic neuropathy ([place]) 10/10/2011  . OSA (obstructive sleep apnea) 10/10/2011    Orientation RESPIRATION BLADDER Height & Weight     Self, Time, Situation, Place  O2 (Nasal cannula 2L) Continent, Indwelling catheter (Urinary catheter) Weight:   Height:     BEHAVIORAL SYMPTOMS/MOOD NEUROLOGICAL BOWEL NUTRITION STATUS      Continent Diet (Please see DC Summary)  AMBULATORY STATUS COMMUNICATION OF NEEDS Skin   Total Care Verbally Other (Comment) (Wound on toe)                       Personal Care Assistance Level of Assistance  Bathing, Dressing Bathing Assistance: Maximum assistance   Dressing Assistance: Maximum assistance     Functional Limitations Info             SPECIAL CARE FACTORS FREQUENCY  PT (By licensed PT)     PT Frequency: 5x              Contractures Contractures Info: Not present    Additional Factors Info  Code Status, Allergies, Psychotropic, Insulin Sliding Scale, Isolation Precautions Code Status Info: Full Allergies Info: Ace Inhibitors, Lipitor Atorvastatin Calcium, Metformin And Related, Cefadroxil, Cephalexin, Morphine And Related, Robaxin Methocarbamol Psychotropic Info: Trazadone   Isolation Precautions Info:  ESBL;MRSA     Current Medications (05/28/2016):  This is the current hospital active medication list Current Facility-Administered Medications  Medication Dose Route Frequency Provider Last Rate Last Dose  . 0.9 %  sodium chloride infusion   Intravenous Continuous Samella Parr, NP   Stopped at 05/27/16 1900  .  0.9 %  sodium chloride infusion   Intravenous Continuous Allie Bossier, MD   Stopped at 05/27/16 2154  . acetaminophen (TYLENOL) tablet 650 mg  650 mg Oral Q4H PRN Samella Parr, NP      . aspirin EC tablet 81 mg  81 mg Oral Daily Samella Parr, NP   81 mg at 05/28/16 1151  . Chlorhexidine Gluconate Cloth 2 % PADS 6 each  6 each Topical Q0600 Allie Bossier, MD   6 each at 05/28/16 [phone]  . dextrose 5 %-0.45 % sodium chloride infusion   Intravenous Continuous Allie Bossier, MD 50 mL/hr at 05/28/16 0600    . dextrose 50 % solution 25 mL  25 mL Intravenous PRN Allie Bossier, MD      . enoxaparin (LOVENOX) injection 40 mg  40 mg Subcutaneous Q24H Samella Parr, NP   40 mg at 05/27/16 1418  . escitalopram (LEXAPRO) tablet 10 mg  10 mg Oral Daily Samella Parr, NP   10 mg at 05/28/16 1151  . folic acid (FOLVITE) tablet 1 mg  1 mg Oral Daily Samella Parr, NP   1 mg at 05/28/16 1152  . gabapentin (NEURONTIN) capsule 100 mg  100 mg Oral BID Samella Parr, NP   100 mg at 05/28/16 1151  . hydrocortisone sodium succinate (SOLU-CORTEF) 100 MG injection 50 mg  50 mg Intravenous Q8H Samella Parr, NP   50 mg at 05/28/16 1152  . hydrOXYzine (ATARAX/VISTARIL) tablet 25 mg  25 mg Oral QID Samella Parr, NP   25 mg at 05/28/16 1152  . insulin regular (NOVOLIN R,HUMULIN R) 250 Units in sodium chloride 0.9 % 250 mL (1 Units/mL) infusion   Intravenous Continuous Allie Bossier, MD 5.2 mL/hr at 05/28/16 1203 5.2 Units/hr at 05/28/16 1203  . insulin regular bolus via infusion 0-10 Units  0-10 Units Intravenous TID WC Allie Bossier, MD      . magnesium oxide (MAG-OX) tablet 400 mg  400 mg Oral Daily Samella Parr, NP   400 mg at 05/28/16 1152  . mupirocin ointment (BACTROBAN) 2 % 1 application  1 application Nasal BID Allie Bossier, MD   1 application at 89/21/19 1153  . ondansetron (ZOFRAN) injection 4 mg  4 mg Intravenous Q6H PRN Samella Parr, NP      . rOPINIRole (REQUIP) tablet 0.5 mg  0.5 mg  Oral QHS Samella Parr, NP   0.5 mg at 05/27/16 2145  . senna-docusate (Senokot-S) tablet 2 tablet  2 tablet Oral QHS Samella Parr, NP   2 tablet at 05/27/16 2145  . traMADol-acetaminophen (ULTRACET) 37.5-325 MG per tablet 2 tablet  2 tablet Oral Q6H PRN Samella Parr, NP   2 tablet at 05/28/16 0518  . traZODone (DESYREL) tablet 25 mg  25 mg Oral QHS Samella Parr, NP   25 mg at 05/27/16 2145  . vancomycin (VANCOCIN) 1,500 mg in sodium chloride 0.9 % 500 mL IVPB  1,500 mg Intravenous Q12H Karren Cobble, RPH   1,500 mg at 05/28/16 1154     Discharge Medications: Please see discharge summary  for a list of discharge medications.  Relevant Imaging Results:  Relevant Lab Results:   Additional Information SS#: [id number]  Benard Halsted, LCSWA

## 2016-05-29 DIAGNOSIS — E1165 Type 2 diabetes mellitus with hyperglycemia: Secondary | ICD-10-CM

## 2016-05-29 DIAGNOSIS — L109 Pemphigus, unspecified: Secondary | ICD-10-CM

## 2016-05-29 DIAGNOSIS — A419 Sepsis, unspecified organism: Secondary | ICD-10-CM

## 2016-05-29 DIAGNOSIS — J690 Pneumonitis due to inhalation of food and vomit: Secondary | ICD-10-CM

## 2016-05-29 DIAGNOSIS — J984 Other disorders of lung: Secondary | ICD-10-CM

## 2016-05-29 DIAGNOSIS — J189 Pneumonia, unspecified organism: Secondary | ICD-10-CM

## 2016-05-29 DIAGNOSIS — IMO0002 Reserved for concepts with insufficient information to code with codable children: Secondary | ICD-10-CM

## 2016-05-29 DIAGNOSIS — R7881 Bacteremia: Secondary | ICD-10-CM

## 2016-05-29 DIAGNOSIS — E118 Type 2 diabetes mellitus with unspecified complications: Secondary | ICD-10-CM

## 2016-05-29 DIAGNOSIS — R6520 Severe sepsis without septic shock: Secondary | ICD-10-CM

## 2016-05-29 LAB — BASIC METABOLIC PANEL
ANION GAP: 9 (ref 5–15)
Anion gap: 10 (ref 5–15)
BUN: 25 mg/dL — AB (ref 6–20)
BUN: 24 mg/dL — ABNORMAL HIGH (ref 6–20)
CALCIUM: 7.7 mg/dL — AB (ref 8.9–10.3)
CALCIUM: 7.6 mg/dL — AB (ref 8.9–10.3)
CO2: 22 mmol/L (ref 22–32)
CO2: 20 mmol/L — AB (ref 22–32)
CREATININE: 1.06 mg/dL (ref 0.61–1.24)
Chloride: 97 mmol/L — ABNORMAL LOW (ref 101–111)
Chloride: 97 mmol/L — ABNORMAL LOW (ref 101–111)
Creatinine, Ser: 1 mg/dL (ref 0.61–1.24)
GFR calc Af Amer: >60 mL/min (ref >60)
GFR calc non Af Amer: >60 mL/min (ref >60)
GLUCOSE: 444 mg/dL — AB (ref 65–99)
GLUCOSE: 419 mg/dL — AB (ref 65–99)
Potassium: 4.4 mmol/L (ref 3.5–5.1)
Potassium: 4.7 mmol/L (ref 3.5–5.1)
SODIUM: 128 mmol/L — AB (ref 135–145)
Sodium: 127 mmol/L — ABNORMAL LOW (ref 135–145)

## 2016-05-29 LAB — COMPREHENSIVE METABOLIC PANEL
ALT: 44 U/L (ref 17–63)
ANION GAP: 9 (ref 5–15)
AST: 45 U/L — ABNORMAL HIGH (ref 15–41)
Albumin: 1.9 g/dL — ABNORMAL LOW (ref 3.5–5.0)
Alkaline Phosphatase: 209 U/L — ABNORMAL HIGH (ref 38–126)
BILIRUBIN TOTAL: 0.8 mg/dL (ref 0.3–1.2)
BUN: 26 mg/dL — ABNORMAL HIGH (ref 6–20)
CHLORIDE: 97 mmol/L — AB (ref 101–111)
CO2: 25 mmol/L (ref 22–32)
Calcium: 7.8 mg/dL — ABNORMAL LOW (ref 8.9–10.3)
Creatinine, Ser: 1.1 mg/dL (ref 0.61–1.24)
GFR calc Af Amer: >60 mL/min (ref >60)
Glucose, Bld: 339 mg/dL — ABNORMAL HIGH (ref 65–99)
Potassium: 4.2 mmol/L (ref 3.5–5.1)
Sodium: 131 mmol/L — ABNORMAL LOW (ref 135–145)
TOTAL PROTEIN: 5.2 g/dL — AB (ref 6.5–8.1)

## 2016-05-29 LAB — EXPECTORATED SPUTUM ASSESSMENT W REFEX TO RESP CULTURE

## 2016-05-29 LAB — CBC
HEMATOCRIT: 34.7 % — AB (ref 39.0–52.0)
HEMOGLOBIN: 11.2 g/dL — AB (ref 13.0–17.0)
MCH: 29.2 pg (ref 26.0–34.0)
MCHC: 32.3 g/dL (ref 30.0–36.0)
MCV: 90.6 fL (ref 78.0–100.0)
Platelets: 214 10*3/uL (ref 150–400)
RBC: 3.83 MIL/uL — AB (ref 4.22–5.81)
RDW: 13.8 % (ref 11.5–15.5)
WBC: 17.4 10*3/uL — AB (ref 4.0–10.5)

## 2016-05-29 LAB — GLUCOSE, CAPILLARY
GLUCOSE-CAPILLARY: 321 mg/dL — AB (ref 65–99)
GLUCOSE-CAPILLARY: 409 mg/dL — AB (ref 65–99)
GLUCOSE-CAPILLARY: 415 mg/dL — AB (ref 65–99)
Glucose-Capillary: 363 mg/dL — ABNORMAL HIGH (ref 65–99)
Glucose-Capillary: 374 mg/dL — ABNORMAL HIGH (ref 65–99)
Glucose-Capillary: 402 mg/dL — ABNORMAL HIGH (ref 65–99)

## 2016-05-29 LAB — LACTIC ACID, PLASMA: LACTIC ACID, VENOUS: 1.2 mmol/L (ref 0.5–1.9)

## 2016-05-29 LAB — EXPECTORATED SPUTUM ASSESSMENT W GRAM STAIN, RFLX TO RESP C

## 2016-05-29 MED ORDER — SODIUM CHLORIDE 0.9 % IV SOLN
3.0000 g | Freq: Three times a day (TID) | INTRAVENOUS | Status: DC
  Administered 2016-05-29 – 2016-05-31 (×6): 3 g via INTRAVENOUS
  Filled 2016-05-29 (×8): qty 3

## 2016-05-29 MED ORDER — IPRATROPIUM-ALBUTEROL 0.5-2.5 (3) MG/3ML IN SOLN
3.0000 mL | Freq: Three times a day (TID) | RESPIRATORY_TRACT | Status: DC
  Administered 2016-05-29 – 2016-05-31 (×6): 3 mL via RESPIRATORY_TRACT
  Filled 2016-05-29 (×6): qty 3

## 2016-05-29 MED ORDER — INSULIN GLARGINE 100 UNIT/ML ~~LOC~~ SOLN
45.0000 [IU] | Freq: Every day | SUBCUTANEOUS | Status: DC
Start: 2016-05-29 — End: 2016-05-30
  Administered 2016-05-29: 45 [IU] via SUBCUTANEOUS
  Filled 2016-05-29 (×2): qty 0.45

## 2016-05-29 MED ORDER — INSULIN ASPART 100 UNIT/ML ~~LOC~~ SOLN
6.0000 [IU] | Freq: Once | SUBCUTANEOUS | Status: AC
  Administered 2016-05-29: 6 [IU] via SUBCUTANEOUS

## 2016-05-29 MED ORDER — INSULIN ASPART 100 UNIT/ML ~~LOC~~ SOLN
8.0000 [IU] | Freq: Three times a day (TID) | SUBCUTANEOUS | Status: DC
  Administered 2016-05-29 (×2): 8 [IU] via SUBCUTANEOUS

## 2016-05-29 MED ORDER — INSULIN ASPART 100 UNIT/ML ~~LOC~~ SOLN
10.0000 [IU] | Freq: Once | SUBCUTANEOUS | Status: AC
  Administered 2016-05-29: 10 [IU] via SUBCUTANEOUS

## 2016-05-29 NOTE — Progress Notes (Signed)
Patient originally stated he would try and use CPAP tonight. RT brought CPAP over for patient and he is now refusing. CPAP is in room patient states he may try and use it tomorrow night.

## 2016-05-29 NOTE — Progress Notes (Signed)
PROGRESS NOTE    Denario Bagot Sica  IPJ:[id number] DOB: 1946/01/12 DOA: 05/27/2016 PCP: Irven Shelling, MD   Brief Narrative:  70 y.o. 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 Patient was sent to the skilled nursing facility for reports of chest pain. Patient states that he awakened early this morning with significant chest pressure and "it felt like someone was standing on my chest" which was typical for his previous cardiac pain. The nursing facility given a total of 3 nitroglycerin which initially resolved his pain. He went back to sleep. He will awaken with the same pain requested additional nitroglycerin which did not improve his symptoms. Upon arrival to the ER he was found to have a white count of 29,300 with a glucose of 587 and a blood pressure of 85/56. Subsequent lactic acid was elevated 2.44. His MAP was greater than 65 but it was felt patient had sepsis physiology and therefore he was started on stress dose steroids and given multiple IV fluid challenges. Source unclear although EDP felt patient had increasing erythematous changes in his legs and felt he had a secondary cellulitis in setting of known bullous pemphigus. Request was made for internal medicine admission.  Upon further discussion with the patient he can clarified the above chest pain findings and improvement after nitroglycerin initially but second episode did not improve after nitroglycerin. He has not had any fevers or chills although this had increasing fatigue and a mild, nonproductive cough in  the past 24 hours. Reports that he feels he still has some decubitus on both buttocks. He reports he is nonambulatory and in the past from the facility has attempted to get him out of bed to the chair he describes what sounds like a significant orthostatic episode. He tells me his Foley catheter has been in place for many years. He has seen a urologist but he cannot recall the urologist name. He reports he has had some nausea without vomiting for 48 hours. He reports that he may be having slightly increased drainage from the open areas on his legs but he is unable to characterize the color or consistency of drainage since he cannot see his legs.   Subjective: 9/19 A/O 4, positive right lower chest pain with positive sputum (thick yellow).    Assessment & Plan:   Principal Problem:   Sepsis ([place]) Active Problems:   Diabetes mellitus due to underlying condition, uncontrolled, with hyperosmolarity without coma, with long-term current use of insulin (HCC)   OSA (obstructive sleep apnea)   Chronic indwelling Foley catheter   Chronic diastolic CHF (congestive heart failure) (HCC)   History of DVT (deep vein thrombosis)   Pressure ulcer   Neurogenic bladder   Hyperlipidemia LDL goal <70   Morbid obesity (HCC)   CAD in native artery   H/O: CVA (cerebrovascular accident)   Bullous pemphigus   OSA on CPAP   Chest pain   Sepsis positive MRSA bacteremia  -lactic acid has normalized - ID following - cont Vanc - repeat blood cx - f/u TTE   Right Lower Lobe Cavitary Pneumonia/HCAP vs Aspiration  -Flutter valve -Physiotherapy vest BID -DuoNeb TID -ID change  antibiotics: Started Unasyn,  Diabetes type 2 Uncontrolled with hyperosmolarity without coma -CBG greater than 500 at admit  -9/17 Hemoglobin A1c = 11.7  - Lantus 45 units QHS -Start 8 units NovoLog QAC -Resistant SSI  CAD native artery -cardiac catheterization in 2016 noted residual disease without any areas amenable to PCI  therefore focus was on medical therapy - current pain not c/w USAP  Bullous pemphigus -diagnosed in August 2017 - on prednisone 40 mg - reports improvement on steroids - multiple scattered crusted/scabbed wounds - no active/acute bullae -Solu-Cortef 50 mg BID  Chronic indwelling Foley catheter/Neurogenic bladder -in place at least since 2013 - leaking large volumes of urine around the catheter and has a chronic excoriation to the glans penis  History of DVT -Not on chronic anticoagulation - d-dimer not elevated   Moisture associated skin damage B Buttocks -wound care as per [place] RN  Morbid obesity /OSA on CPAP -Bedbound and becomes orthostatic with attempts to mobilize - cont nocturnal CPAP  Chronic diastolic CHF / HTN -Strict I&O  -Daily weight  Filed Weights   05/29/16 0401  Weight: (!) 155.9 kg (343 lb 11.2 oz)   Hyperlipidemia  -Not on statin prior to admission  Hx CVA   Morbid obesity      DVT prophylaxis: Lovenox Code Status: Full Family Communication:None Disposition Plan: SNF   Consultants:  None  Procedures/Significant Events:  9/18 CT chest W contrast:- cavitary right lower lobe pneumonia. -Small parapneumonic right pleural effusion with mild compressive atelectasis  -New mild right paratracheal and subcarinal lymphadenopathy, nonspecific 9/18 Echocardiogram:- Left ventricle: severe concentric hypertrophy. Systolic function was normal. - Aortic valve: Valve mobility was restricted. Mild to moderate stenosis. - Left atrium: moderately dilated.    Cultures 9/17 blood right arm NGTD 9/17 blood right arm positive MRSA 9/17 urine positive multiple species 9/18 blood right hand NGTD 9/19 urine pending 9/19 sputum pending    Antimicrobials: Zosyn 9/17 >> 9/18 Vancomycin 9/17 > > Unasyn 9/19>>   Devices    LINES / TUBES:      Continuous Infusions: . sodium chloride 50 mL/hr at 05/28/16 2024     Objective: Vitals:    05/29/16 0207 05/29/16 0209 05/29/16 0400 05/29/16 0401  BP: (!) 159/61 (!) 88/59 98/63   Pulse:      Resp: (!) 22 (!) 26 18   Temp:   98.3 F (36.8 C)   TempSrc:   Oral   SpO2: 95% 95% 95%   Weight:    (!) 155.9 kg (343 lb 11.2 oz)    Intake/Output Summary (Last 24 hours) at 05/29/16 0939 Last data filed at 05/29/16 0500  Gross per 24 hour  Intake          2175.83 ml  Output              475 ml  Net          1700.83 ml   Filed Weights   05/29/16 0401  Weight: (!) 155.9 kg (343 lb 11.2 oz)    Examination:  General: A/O 4, No acute respiratory distress. Positive productive sputum (thick yellow) Eyes: negative scleral hemorrhage, negative anisocoria, negative icterus ENT: Negative Runny nose, negative gingival bleeding, Neck:  Negative scars, masses, torticollis, lymphadenopathy, JVD Lungs: Clear to auscultation left lung fields, decreased/absent breath sounds RLL, negative  wheezes or crackles Cardiovascular: Tachycardic, Regular rhythm positive 2/6 systolic murmur, negative gallop or rub normal S1 and S2 Abdomen: Morbidly obese, negative abdominal pain, nondistended, positive soft, bowel  sounds, no rebound, no ascites, no appreciable mass Extremities: No significant cyanosis, clubbing, positive bilateral lower extremity edema 2-3+  Skin: multiple ulcerated areas to the bilateral lower extremities as well as some similar areas that are smaller in nature to the upper extremities and upper chest wall and has been previously diagnosed with bullous pemphigoid-there are minor periwound erythematous changes but no significant areas of erythema induration and no purulent drainage Psychiatric:  Negative depression, negative anxiety, negative fatigue, negative mania  Central nervous system:  Cranial nerves II through XII intact, tongue/uvula midline, all extremities muscle strength 5/5, sensation intact throughout,  negative dysarthria, negative expressive aphasia, negative receptive  aphasia.  .     Data Reviewed: Care during the described time interval was provided by me .  I have reviewed this patient's available data, including medical history, events of note, physical examination, and all test results as part of my evaluation. I have personally reviewed and interpreted all radiology studies.  CBC:  Recent Labs Lab 05/27/16 0633 05/29/16 0300  WBC 29.3* 17.4*  HGB 13.6 11.2*  HCT 40.1 34.7*  MCV 88.9 90.6  PLT 227 389   Basic Metabolic Panel:  Recent Labs Lab 05/27/16 0633 05/29/16 0300  NA 127* 131*  K 4.9 4.2  CL 87* 97*  CO2 26 25  GLUCOSE 587* 339*  BUN 21* 26*  CREATININE 1.12 1.10  CALCIUM 8.8* 7.8*   GFR: Estimated Creatinine Clearance: 100.3 mL/min (by C-G formula based on SCr of 1.1 mg/dL). Liver Function Tests:  Recent Labs Lab 05/27/16 0720 05/29/16 0300  AST 15 45*  ALT 20 44  ALKPHOS 138* 209*  BILITOT 0.8 0.8  PROT 7.4 5.2*  ALBUMIN 2.8* 1.9*   No results for input(s): LIPASE, AMYLASE in the last 168 hours. No results for input(s): AMMONIA in the last 168 hours. Coagulation Profile:  Recent Labs Lab 05/27/16 1107  INR 1.22   Cardiac Enzymes:  Recent Labs Lab 05/27/16 1107 05/27/16 1555 05/27/16 2209  TROPONINI 0.11* <0.03 <0.03   BNP (last 3 results) No results for input(s): PROBNP in the last 8760 hours. HbA1C:  Recent Labs  05/27/16 1107  HGBA1C 11.7*   CBG:  Recent Labs Lab 05/28/16 1729 05/28/16 1819 05/28/16 2008 05/28/16 2208 05/29/16 0743  GLUCAP 239* 253* 299* 342* 321*   Lipid Profile: No results for input(s): CHOL, HDL, LDLCALC, TRIG, CHOLHDL, LDLDIRECT in the last 72 hours. Thyroid Function Tests: No results for input(s): TSH, T4TOTAL, FREET4, T3FREE, THYROIDAB in the last 72 hours. Anemia Panel: No results for input(s): VITAMINB12, FOLATE, FERRITIN, TIBC, IRON, RETICCTPCT in the last 72 hours. Urine analysis:    Component Value Date/Time   COLORURINE YELLOW 05/27/2016  1045   APPEARANCEUR TURBID (A) 05/27/2016 1045   LABSPEC 1.030 05/27/2016 1045   PHURINE 5.5 05/27/2016 1045   GLUCOSEU >1000 (A) 05/27/2016 1045   HGBUR MODERATE (A) 05/27/2016 1045   BILIRUBINUR NEGATIVE 05/27/2016 1045   KETONESUR 15 (A) 05/27/2016 1045   PROTEINUR 30 (A) 05/27/2016 1045   UROBILINOGEN 1.0 07/01/2015 1455   NITRITE POSITIVE (A) 05/27/2016 1045   LEUKOCYTESUR MODERATE (A) 05/27/2016 1045   Sepsis Labs: @LABRCNTIP (procalcitonin:4,lacticidven:4)  ) Recent Results (from the past 240 hour(s))  Blood Culture (routine x 2)     Status: None (Preliminary result)   Collection Time: 05/27/16  7:55 AM  Result Value Ref Range Status   Specimen Description BLOOD RIGHT UPPER ARM  Final   Special Requests IN PEDIATRIC BOTTLE  [facility]  Final  Culture NO GROWTH 1 DAY  Final   Report Status PENDING  Incomplete  Blood Culture (routine x 2)     Status: Abnormal (Preliminary result)   Collection Time: 05/27/16  8:08 AM  Result Value Ref Range Status   Specimen Description BLOOD RIGHT UPPER ARM  Final   Special Requests BOTTLES DRAWN AEROBIC AND ANAEROBIC  5CC  Final   Culture  Setup Time   Final    IN BOTH AEROBIC AND ANAEROBIC BOTTLES GRAM POSITIVE COCCI IN CLUSTERS CRITICAL RESULT CALLED TO, READ BACK BY AND VERIFIED WITH: TO GABBOTT(PHARD) BY TCLEVELAND AT 1:59AM    Culture (A)  Final    STAPHYLOCOCCUS AUREUS SUSCEPTIBILITIES TO FOLLOW    Report Status PENDING  Incomplete  Blood Culture ID Panel (Reflexed)     Status: Abnormal   Collection Time: 05/27/16  8:08 AM  Result Value Ref Range Status   Enterococcus species NOT DETECTED NOT DETECTED Final   Vancomycin resistance NOT DETECTED NOT DETECTED Final   Listeria monocytogenes NOT DETECTED NOT DETECTED Final   Staphylococcus species DETECTED (A) NOT DETECTED Final    Comment: CRITICAL RESULT CALLED TO, READ BACK BY AND VERIFIED WITH: TO GABBOTT(PHARD) BY TCLEVELAND AT 1:59AM    Staphylococcus aureus DETECTED (A) NOT  DETECTED Final    Comment: CRITICAL RESULT CALLED TO, READ BACK BY AND VERIFIED WITH: TO GABBOTT(PHARD) BY TCLEVELAND AT 1:59AM    Methicillin resistance DETECTED (A) NOT DETECTED Final    Comment: CRITICAL RESULT CALLED TO, READ BACK BY AND VERIFIED WITH: TO GABBOTT(PHARD) BY TCLEVELAND AT 2:06    Streptococcus species NOT DETECTED NOT DETECTED Final   Streptococcus agalactiae NOT DETECTED NOT DETECTED Final   Streptococcus pneumoniae NOT DETECTED NOT DETECTED Final   Streptococcus pyogenes NOT DETECTED NOT DETECTED Final   Acinetobacter baumannii NOT DETECTED NOT DETECTED Final   Enterobacteriaceae species NOT DETECTED NOT DETECTED Final   Enterobacter cloacae complex NOT DETECTED NOT DETECTED Final   Escherichia coli NOT DETECTED NOT DETECTED Final   Klebsiella oxytoca NOT DETECTED NOT DETECTED Final   Klebsiella pneumoniae NOT DETECTED NOT DETECTED Final   Proteus species NOT DETECTED NOT DETECTED Final   Serratia marcescens NOT DETECTED NOT DETECTED Final   Carbapenem resistance NOT DETECTED NOT DETECTED Final   Haemophilus influenzae NOT DETECTED NOT DETECTED Final   Neisseria meningitidis NOT DETECTED NOT DETECTED Final   Pseudomonas aeruginosa NOT DETECTED NOT DETECTED Final   Candida albicans NOT DETECTED NOT DETECTED Final   Candida glabrata NOT DETECTED NOT DETECTED Final   Candida krusei NOT DETECTED NOT DETECTED Final   Candida parapsilosis NOT DETECTED NOT DETECTED Final   Candida tropicalis NOT DETECTED NOT DETECTED Final  Urine culture     Status: Abnormal   Collection Time: 05/27/16 10:45 AM  Result Value Ref Range Status   Specimen Description URINE, CATHETERIZED  Final   Special Requests NONE  Final   Culture MULTIPLE SPECIES PRESENT, SUGGEST RECOLLECTION (A)  Final   Report Status 05/28/2016 FINAL  Final  MRSA PCR Screening     Status: Abnormal   Collection Time: 05/27/16 12:48 PM  Result Value Ref Range Status   MRSA by PCR POSITIVE (A) NEGATIVE Final     Comment:        The GeneXpert MRSA Assay (FDA approved for NASAL specimens only), is one component of a comprehensive MRSA colonization surveillance program. It is not intended to diagnose MRSA infection nor to guide or monitor treatment for MRSA infections. RESULT CALLED  TO, READ BACK BY AND VERIFIED WITH: S VIVERITO,RN AT 1618 05/27/16 BY L BENFIELD          Radiology Studies: Ct Chest W Contrast  Result Date: 05/28/2016 CLINICAL DATA:  70 year old male inpatient with a reported history of right supraclavicular lymphadenopathy and tenderness. Bacteremia. EXAM: CT CHEST WITH CONTRAST TECHNIQUE: Multidetector CT imaging of the chest was performed during intravenous contrast administration. CONTRAST:  64mL ISOVUE-300 IOPAMIDOL (ISOVUE-300) INJECTION 61% COMPARISON:  Chest radiograph from earlier today. 09/07/2015 chest CT. FINDINGS: Cardiovascular: New mild cardiomegaly. Trace pericardial effusion/ thickening. Left anterior descending, left circumflex and right coronary atherosclerosis. Atherosclerotic nonaneurysmal thoracic aorta. Normal caliber pulmonary arteries. No central pulmonary emboli. Mediastinum/Nodes: No discrete thyroid nodules. Unremarkable esophagus. No axillary adenopathy. New mild right paratracheal adenopathy measuring up to 1.4 cm (series 3/ image 47). New mild subcarinal adenopathy measuring up to 1.4 cm (series 3/image 60). No additional pathologically enlarged mediastinal or hilar nodes. Lungs/Pleura: No pneumothorax. Small right pleural effusion. No left pleural effusion. There is near complete consolidation of the right lower lobe, with some associated volume loss in the right lower lobe, and focal mild cavitary change in the basilar right lower lobe. Mild compressive atelectasis in the dependent right upper and right middle lobes. Mild hypoventilatory change in the dependent left lower lobe. No significant pulmonary nodules in the aerated portions of the lungs. Upper  abdomen: Cholecystectomy. Surgical sutures and clips are noted in the region of the proximal stomach, which causes streak artifact and limits visualization in this region. Musculoskeletal: No aggressive appearing focal osseous lesions. Stable small sclerotic lesion in the T5 vertebral body, probably a benign bone island. Moderate thoracic spondylosis. Moderate symmetric gynecomastia appears stable. IMPRESSION: 1. Chest CT findings are most suggestive of a cavitary right lower lobe pneumonia. 2. Small parapneumonic right pleural effusion with mild compressive atelectasis in the dependent right lung. 3. New mild right paratracheal and subcarinal lymphadenopathy, nonspecific, possibly reactive. 4. A follow-up chest CT with IV contrast is recommended in 3 months to document resolution of these findings. 5. New mild cardiomegaly.  Trace pericardial effusion/thickening. 6. Aortic atherosclerosis.  Three-vessel coronary atherosclerosis. Electronically Signed   By: Ilona Sorrel M.D.   On: 05/28/2016 17:21   Dg Chest Port 1 View  Result Date: 05/28/2016 CLINICAL DATA:  Sepsis EXAM: PORTABLE CHEST 1 VIEW COMPARISON:  05/27/2016 FINDINGS: Cardiomegaly with vascular congestion. Bibasilar atelectasis, right greater than left. Mild interstitial prominence throughout the lungs could reflect mild interstitial edema. This is increased slightly since prior study. IMPRESSION: Low lung volumes with bibasilar atelectasis. Cardiomegaly with vascular congestion and possible mild interstitial edema. Electronically Signed   By: Rolm Baptise M.D.   On: 05/28/2016 07:41        Scheduled Meds: . aspirin EC  81 mg Oral Daily  . Chlorhexidine Gluconate Cloth  6 each Topical Q0600  . enoxaparin (LOVENOX) injection  40 mg Subcutaneous Q24H  . escitalopram  10 mg Oral Daily  . folic acid  1 mg Oral Daily  . gabapentin  100 mg Oral BID  . hydrocortisone sod succinate (SOLU-CORTEF) inj  50 mg Intravenous Q12H  . hydrOXYzine  25 mg  Oral QID  . insulin aspart  0-20 Units Subcutaneous TID WC  . insulin aspart  0-5 Units Subcutaneous QHS  . insulin glargine  20 Units Subcutaneous QHS  . magnesium oxide  400 mg Oral Daily  . mupirocin ointment  1 application Nasal BID  . rOPINIRole  0.5 mg Oral QHS  .  senna-docusate  2 tablet Oral QHS  . silver sulfADIAZINE   Topical Daily  . traZODone  25 mg Oral QHS  . vancomycin  1,500 mg Intravenous Q12H   Continuous Infusions: . sodium chloride 50 mL/hr at 05/28/16 2024     LOS: 2 days    Time spent: 40 minutes    WOODS, Geraldo Docker, MD [facility] Pager [phone]   If 7PM-7AM, please contact night-coverage [email or web address] Password [facility] 05/29/2016, 9:39 AM

## 2016-05-29 NOTE — Clinical Social Work Note (Signed)
Clinical Social Work Assessment  Patient Details  Name: [name] MRN: [id number] Date of Birth: 29-Nov-1945  Date of referral:  05/29/16               Reason for consult:  Facility Placement                Permission sought to share information with:  Family Supports Permission granted to share information::  Yes, Verbal Permission Granted  Name::        Agency::  [facility]  Relationship::     Contact Information:     Housing/Transportation Living arrangements for the past 2 months:  [place] of Information:  Patient Patient Interpreter Needed:  None Criminal Activity/Legal Involvement Pertinent to Current Situation/Hospitalization:  No - Comment as needed Significant Relationships:  Siblings Lives with:  Facility Resident Do you feel safe going back to the place where you live?  No Need for family participation in patient care:  No (Coment)  Care giving concerns:  None- pt is LTC resident at [facility] and states he is well cared for at [facility]   Social Worker assessment / plan:  [name] spoke with pt concerning plan for time of DC.  Pt confirms he is from [place] and that plan will be to return at time of DC.  Employment status:  Retired Forensic scientist:  Medicare PT Recommendations:  Not assessed at this time Information / Referral to community resources:     Patient/Family's Response to care: Pt is agreeable to return to SNF when stable.  Patient/Family's Understanding of and Emotional Response to Diagnosis, Current Treatment, and Prognosis:  No questions or concerns at this time- hopeful to feel better soon.  Emotional Assessment Appearance:  Appears stated age Attitude/Demeanor/Rapport:    Affect (typically observed):  Appropriate Orientation:  Oriented to Self, Oriented to Place, Oriented to  Time, Oriented to Situation Alcohol / Substance use:  Not Applicable Psych involvement (Current and /or in the community):  No (Comment)  Discharge  Needs  Concerns to be addressed:  Care Coordination Readmission within the last 30 days:    Current discharge risk:  Physical Impairment Barriers to Discharge:  Continued Medical Work up   Jorge Ny, LCSW 05/29/2016, 9:25 AM

## 2016-05-29 NOTE — Progress Notes (Signed)
INFECTIOUS DISEASE PROGRESS NOTE  ID: [name] is a 70 y.o. male with  Principal Problem:   Sepsis ([place]) Active Problems:   Diabetes mellitus due to underlying condition, uncontrolled, with hyperosmolarity without coma, with long-term current use of insulin (HCC)   OSA (obstructive sleep apnea)   Chronic indwelling Foley catheter   Chronic diastolic CHF (congestive heart failure) (HCC)   History of DVT (deep vein thrombosis)   Pressure ulcer   Neurogenic bladder   Hyperlipidemia LDL goal <70   Morbid obesity (HCC)   CAD in native artery   H/O: CVA (cerebrovascular accident)   Bullous pemphigus   OSA on CPAP   Chest pain  Subjective: Complaints of n/v.   Abtx:  Anti-infectives    Start     Dose/Rate Route Frequency Ordered Stop   05/27/16 2000  vancomycin (VANCOCIN) 1,500 mg in sodium chloride 0.9 % 500 mL IVPB     1,500 mg 250 mL/hr over 120 Minutes Intravenous Every 12 hours 05/27/16 0736     05/27/16 1400  piperacillin-tazobactam (ZOSYN) IVPB 3.375 g  Status:  Discontinued     3.375 g 12.5 mL/hr over 240 Minutes Intravenous Every 8 hours 05/27/16 0753 05/28/16 1042   05/27/16 0830  vancomycin (VANCOCIN) 2,000 mg in sodium chloride 0.9 % 500 mL IVPB     2,000 mg 250 mL/hr over 120 Minutes Intravenous  Once 05/27/16 0736 05/27/16 1123   05/27/16 0745  piperacillin-tazobactam (ZOSYN) IVPB 3.375 g     3.375 g 100 mL/hr over 30 Minutes Intravenous STAT 05/27/16 0736 05/27/16 0839      Medications:  Scheduled: . aspirin EC  81 mg Oral Daily  . Chlorhexidine Gluconate Cloth  6 each Topical Q0600  . enoxaparin (LOVENOX) injection  40 mg Subcutaneous Q24H  . escitalopram  10 mg Oral Daily  . folic acid  1 mg Oral Daily  . gabapentin  100 mg Oral BID  . hydrocortisone sod succinate (SOLU-CORTEF) inj  50 mg Intravenous Q12H  . hydrOXYzine  25 mg Oral QID  . insulin aspart  0-20 Units Subcutaneous TID WC  . insulin aspart  0-5 Units Subcutaneous QHS  . insulin  aspart  8 Units Subcutaneous TID WC  . insulin glargine  20 Units Subcutaneous QHS  . magnesium oxide  400 mg Oral Daily  . mupirocin ointment  1 application Nasal BID  . rOPINIRole  0.5 mg Oral QHS  . senna-docusate  2 tablet Oral QHS  . silver sulfADIAZINE   Topical Daily  . traZODone  25 mg Oral QHS  . vancomycin  1,500 mg Intravenous Q12H    Objective: Vital signs in last 24 hours: Temp:  [98 F (36.7 C)-98.3 F (36.8 C)] 98.3 F (36.8 C) (09/19 0400) Resp:  [18-27] 18 (09/19 0400) BP: (78-176)/(54-80) 98/63 (09/19 0400) SpO2:  [92 %-100 %] 95 % (09/19 0400) Weight:  [155.9 kg (343 lb 11.2 oz)] 155.9 kg (343 lb 11.2 oz) (09/19 0401)   General appearance: alert, cooperative and no distress Resp: clear to auscultation bilaterally Cardio: regular rate and rhythm GI: normal findings: bowel sounds normal and soft, non-tender Extremities: multiple lesions on BLE. less erythema, no pustulence.   Lab Results  Recent Labs  05/27/16 0633 05/29/16 0300  WBC 29.3* 17.4*  HGB 13.6 11.2*  HCT 40.1 34.7*  NA 127* 131*  K 4.9 4.2  CL 87* 97*  CO2 26 25  BUN 21* 26*  CREATININE 1.12 1.10   Liver Panel  Recent Labs  05/27/16 0720 05/29/16 0300  PROT 7.4 5.2*  ALBUMIN 2.8* 1.9*  AST 15 45*  ALT 20 44  ALKPHOS 138* 209*  BILITOT 0.8 0.8  BILIDIR 0.2  --   IBILI 0.6  --    Sedimentation Rate No results for input(s): ESRSEDRATE in the last 72 hours. C-Reactive Protein No results for input(s): CRP in the last 72 hours.  Microbiology: Recent Results (from the past 240 hour(s))  Blood Culture (routine x 2)     Status: None (Preliminary result)   Collection Time: 05/27/16  7:55 AM  Result Value Ref Range Status   Specimen Description BLOOD RIGHT UPPER ARM  Final   Special Requests IN PEDIATRIC BOTTLE  2CC  Final   Culture NO GROWTH 2 DAYS  Final   Report Status PENDING  Incomplete  Blood Culture (routine x 2)     Status: Abnormal (Preliminary result)   Collection  Time: 05/27/16  8:08 AM  Result Value Ref Range Status   Specimen Description BLOOD RIGHT UPPER ARM  Final   Special Requests BOTTLES DRAWN AEROBIC AND ANAEROBIC  5CC  Final   Culture  Setup Time   Final    IN BOTH AEROBIC AND ANAEROBIC BOTTLES GRAM POSITIVE COCCI IN CLUSTERS CRITICAL RESULT CALLED TO, READ BACK BY AND VERIFIED WITH: TO GABBOTT(PHARD) BY TCLEVELAND AT 1:59AM    Culture (A)  Final    STAPHYLOCOCCUS AUREUS SUSCEPTIBILITIES TO FOLLOW    Report Status PENDING  Incomplete  Blood Culture ID Panel (Reflexed)     Status: Abnormal   Collection Time: 05/27/16  8:08 AM  Result Value Ref Range Status   Enterococcus species NOT DETECTED NOT DETECTED Final   Vancomycin resistance NOT DETECTED NOT DETECTED Final   Listeria monocytogenes NOT DETECTED NOT DETECTED Final   Staphylococcus species DETECTED (A) NOT DETECTED Final    Comment: CRITICAL RESULT CALLED TO, READ BACK BY AND VERIFIED WITH: TO GABBOTT(PHARD) BY TCLEVELAND AT 1:59AM    Staphylococcus aureus DETECTED (A) NOT DETECTED Final    Comment: CRITICAL RESULT CALLED TO, READ BACK BY AND VERIFIED WITH: TO GABBOTT(PHARD) BY TCLEVELAND AT 1:59AM    Methicillin resistance DETECTED (A) NOT DETECTED Final    Comment: CRITICAL RESULT CALLED TO, READ BACK BY AND VERIFIED WITH: TO GABBOTT(PHARD) BY TCLEVELAND AT 2:06    Streptococcus species NOT DETECTED NOT DETECTED Final   Streptococcus agalactiae NOT DETECTED NOT DETECTED Final   Streptococcus pneumoniae NOT DETECTED NOT DETECTED Final   Streptococcus pyogenes NOT DETECTED NOT DETECTED Final   Acinetobacter baumannii NOT DETECTED NOT DETECTED Final   Enterobacteriaceae species NOT DETECTED NOT DETECTED Final   Enterobacter cloacae complex NOT DETECTED NOT DETECTED Final   Escherichia coli NOT DETECTED NOT DETECTED Final   Klebsiella oxytoca NOT DETECTED NOT DETECTED Final   Klebsiella pneumoniae NOT DETECTED NOT DETECTED Final   Proteus species NOT DETECTED NOT DETECTED  Final   Serratia marcescens NOT DETECTED NOT DETECTED Final   Carbapenem resistance NOT DETECTED NOT DETECTED Final   Haemophilus influenzae NOT DETECTED NOT DETECTED Final   Neisseria meningitidis NOT DETECTED NOT DETECTED Final   Pseudomonas aeruginosa NOT DETECTED NOT DETECTED Final   Candida albicans NOT DETECTED NOT DETECTED Final   Candida glabrata NOT DETECTED NOT DETECTED Final   Candida krusei NOT DETECTED NOT DETECTED Final   Candida parapsilosis NOT DETECTED NOT DETECTED Final   Candida tropicalis NOT DETECTED NOT DETECTED Final  Urine culture     Status: Abnormal  Collection Time: 05/27/16 10:45 AM  Result Value Ref Range Status   Specimen Description URINE, CATHETERIZED  Final   Special Requests NONE  Final   Culture MULTIPLE SPECIES PRESENT, SUGGEST RECOLLECTION (A)  Final   Report Status 05/28/2016 FINAL  Final  MRSA PCR Screening     Status: Abnormal   Collection Time: 05/27/16 12:48 PM  Result Value Ref Range Status   MRSA by PCR POSITIVE (A) NEGATIVE Final    Comment:        The GeneXpert MRSA Assay (FDA approved for NASAL specimens only), is one component of a comprehensive MRSA colonization surveillance program. It is not intended to diagnose MRSA infection nor to guide or monitor treatment for MRSA infections. RESULT CALLED TO, READ BACK BY AND VERIFIED WITH: S VIVERITO,RN AT 1618 05/27/16 BY L BENFIELD   Culture, blood (Routine X 2) w Reflex to ID Panel     Status: None (Preliminary result)   Collection Time: 05/28/16 12:15 PM  Result Value Ref Range Status   Specimen Description BLOOD RIGHT HAND  Final   Special Requests IN PEDIATRIC BOTTLE 1CC  Final   Culture NO GROWTH < 24 HOURS  Final   Report Status PENDING  Incomplete    Studies/Results: Ct Chest W Contrast  Result Date: 05/28/2016 CLINICAL DATA:  70 year old male inpatient with a reported history of right supraclavicular lymphadenopathy and tenderness. Bacteremia. EXAM: CT CHEST WITH  CONTRAST TECHNIQUE: Multidetector CT imaging of the chest was performed during intravenous contrast administration. CONTRAST:  2mL ISOVUE-300 IOPAMIDOL (ISOVUE-300) INJECTION 61% COMPARISON:  Chest radiograph from earlier today. 09/07/2015 chest CT. FINDINGS: Cardiovascular: New mild cardiomegaly. Trace pericardial effusion/ thickening. Left anterior descending, left circumflex and right coronary atherosclerosis. Atherosclerotic nonaneurysmal thoracic aorta. Normal caliber pulmonary arteries. No central pulmonary emboli. Mediastinum/Nodes: No discrete thyroid nodules. Unremarkable esophagus. No axillary adenopathy. New mild right paratracheal adenopathy measuring up to 1.4 cm (series 3/ image 47). New mild subcarinal adenopathy measuring up to 1.4 cm (series 3/image 60). No additional pathologically enlarged mediastinal or hilar nodes. Lungs/Pleura: No pneumothorax. Small right pleural effusion. No left pleural effusion. There is near complete consolidation of the right lower lobe, with some associated volume loss in the right lower lobe, and focal mild cavitary change in the basilar right lower lobe. Mild compressive atelectasis in the dependent right upper and right middle lobes. Mild hypoventilatory change in the dependent left lower lobe. No significant pulmonary nodules in the aerated portions of the lungs. Upper abdomen: Cholecystectomy. Surgical sutures and clips are noted in the region of the proximal stomach, which causes streak artifact and limits visualization in this region. Musculoskeletal: No aggressive appearing focal osseous lesions. Stable small sclerotic lesion in the T5 vertebral body, probably a benign bone island. Moderate thoracic spondylosis. Moderate symmetric gynecomastia appears stable. IMPRESSION: 1. Chest CT findings are most suggestive of a cavitary right lower lobe pneumonia. 2. Small parapneumonic right pleural effusion with mild compressive atelectasis in the dependent right lung. 3.  New mild right paratracheal and subcarinal lymphadenopathy, nonspecific, possibly reactive. 4. A follow-up chest CT with IV contrast is recommended in 3 months to document resolution of these findings. 5. New mild cardiomegaly.  Trace pericardial effusion/thickening. 6. Aortic atherosclerosis.  Three-vessel coronary atherosclerosis. Electronically Signed   By: Ilona Sorrel M.D.   On: 05/28/2016 17:21   Dg Chest Port 1 View  Result Date: 05/28/2016 CLINICAL DATA:  Sepsis EXAM: PORTABLE CHEST 1 VIEW COMPARISON:  05/27/2016 FINDINGS: Cardiomegaly with vascular congestion. Bibasilar atelectasis,  right greater than left. Mild interstitial prominence throughout the lungs could reflect mild interstitial edema. This is increased slightly since prior study. IMPRESSION: Low lung volumes with bibasilar atelectasis. Cardiomegaly with vascular congestion and possible mild interstitial edema. Electronically Signed   By: Rolm Baptise M.D.   On: 05/28/2016 07:41     Assessment/Plan: MRSA bacteremia Cellulitis R supraclavicular fossa LN Bullus phemigoid Uncontrolled DM2  His TTE does not comment on his valves specifically regarding vegetation.  Would not proceed with TEE His CT chest shows pneumonia, would treat him 8 days as aspiration/HCAP  There is no specific comment on his supraclavicular LN.    Will add unasyn.   Total days of antibiotics: 2 vanco, 0 unasyn         Bobby Rumpf Infectious Diseases (pager) [phone] [email or web address] 05/29/2016, 10:35 AM  LOS: 2 days

## 2016-05-29 NOTE — Progress Notes (Addendum)
Spoke with patient regarding home regimen and what occurred prior to admit.  He states that his NPH 40 units bid was d/c'd and Soliqua 100-33-30 units q HS was added.  He states that this occurred in the past few days.  Note that this is 50 units less insulin then what he was receiving prior. Discussed with Dr. Sherral Hammers.  Orders received to increase Lantus to 45 units q HS.   Will follow.  Thanks, Adah Perl, RN, BC-ADM Inpatient Diabetes Coordinator Pager [phone] (8a-5p)

## 2016-05-29 NOTE — Progress Notes (Signed)
Attempted PIV assessed with ultrasound. Pt requested not to be stuck and wanted another central line. MD & RN made aware. Catalina Pizza

## 2016-05-29 NOTE — Progress Notes (Addendum)
Inpatient Diabetes Program Recommendations  AACE/ADA: New Consensus Statement on Inpatient Glycemic Control (2015)  Target Ranges:  Prepandial:   less than 140 mg/dL      Peak postprandial:   less than 180 mg/dL (1-2 hours)      Critically ill patients:  140 - 180 mg/dL   Lab Results  Component Value Date   GLUCAP 321 (H) 05/29/2016   HGBA1C 11.7 (H) 05/27/2016  Results for LOGEN, HEINTZELMAN (MRN [id number]) as of 05/29/2016 10:49  Ref. Range 03/26/2016 00:00 05/27/2016 11:07  Hemoglobin A1C Latest Ref Range: 4.8 - 5.6 % 8.7 11.7 (H)    Review of Glycemic Control:  Results for NILESH, STEGALL (MRN [id number]) as of 05/29/2016 10:49  Ref. Range 05/28/2016 17:29 05/28/2016 18:19 05/28/2016 20:08 05/28/2016 22:08 05/29/2016 07:43  Glucose-Capillary Latest Ref Range: 65 - 99 mg/dL 239 (H) 253 (H) 299 (H) 342 (H) 321 (H)    Diabetes history: Type 2 diabetes Outpatient Diabetes medications: Soliqua 100-33- 30 units q HS (Patient had also been on NPH 40 units bid however according to medication reconciliation he was not taking) Current orders for Inpatient glycemic control:  Lantus 20 units q HS, Novolog 8 units tid with meals, Novolog resistant tid with meals and HS  Inpatient Diabetes Program Recommendations:    Based on patient's weight, consider increasing Lantus to 45 units q HS (0.3 units/kg).  Also consider increasing Novolog meal coverage to 10 units tid with meals.    Will follow.  Thanks, Adah Perl, RN, BC-ADM Inpatient Diabetes Coordinator Pager [phone] (8a-5p)

## 2016-05-30 DIAGNOSIS — I2510 Atherosclerotic heart disease of native coronary artery without angina pectoris: Secondary | ICD-10-CM

## 2016-05-30 DIAGNOSIS — A4102 Sepsis due to Methicillin resistant Staphylococcus aureus: Principal | ICD-10-CM

## 2016-05-30 DIAGNOSIS — I5032 Chronic diastolic (congestive) heart failure: Secondary | ICD-10-CM

## 2016-05-30 DIAGNOSIS — G4733 Obstructive sleep apnea (adult) (pediatric): Secondary | ICD-10-CM

## 2016-05-30 DIAGNOSIS — Z794 Long term (current) use of insulin: Secondary | ICD-10-CM

## 2016-05-30 DIAGNOSIS — E0800 Diabetes mellitus due to underlying condition with hyperosmolarity without nonketotic hyperglycemic-hyperosmolar coma (NKHHC): Secondary | ICD-10-CM

## 2016-05-30 DIAGNOSIS — N319 Neuromuscular dysfunction of bladder, unspecified: Secondary | ICD-10-CM

## 2016-05-30 LAB — CULTURE, BLOOD (ROUTINE X 2)

## 2016-05-30 LAB — CBC
HEMATOCRIT: 35.5 % — AB (ref 39.0–52.0)
HEMOGLOBIN: 11.5 g/dL — AB (ref 13.0–17.0)
MCH: 29.5 pg (ref 26.0–34.0)
MCHC: 32.4 g/dL (ref 30.0–36.0)
MCV: 91 fL (ref 78.0–100.0)
Platelets: 222 10*3/uL (ref 150–400)
RBC: 3.9 MIL/uL — ABNORMAL LOW (ref 4.22–5.81)
RDW: 13.9 % (ref 11.5–15.5)
WBC: 14 10*3/uL — ABNORMAL HIGH (ref 4.0–10.5)

## 2016-05-30 LAB — BASIC METABOLIC PANEL
Anion gap: 10 (ref 5–15)
BUN: 20 mg/dL (ref 6–20)
CHLORIDE: 98 mmol/L — AB (ref 101–111)
CO2: 21 mmol/L — AB (ref 22–32)
Calcium: 7.9 mg/dL — ABNORMAL LOW (ref 8.9–10.3)
Creatinine, Ser: 0.95 mg/dL (ref 0.61–1.24)
GFR calc Af Amer: >60 mL/min (ref >60)
GLUCOSE: 413 mg/dL — AB (ref 65–99)
POTASSIUM: 4.6 mmol/L (ref 3.5–5.1)
Sodium: 129 mmol/L — ABNORMAL LOW (ref 135–145)

## 2016-05-30 LAB — GLUCOSE, CAPILLARY
GLUCOSE-CAPILLARY: 386 mg/dL — AB (ref 65–99)
Glucose-Capillary: 327 mg/dL — ABNORMAL HIGH (ref 65–99)
Glucose-Capillary: 315 mg/dL — ABNORMAL HIGH (ref 65–99)
Glucose-Capillary: 435 mg/dL — ABNORMAL HIGH (ref 65–99)
Glucose-Capillary: 393 mg/dL — ABNORMAL HIGH (ref 65–99)
Glucose-Capillary: 322 mg/dL — ABNORMAL HIGH (ref 65–99)

## 2016-05-30 LAB — GLUCOSE, RANDOM: Glucose, Bld: 406 mg/dL — ABNORMAL HIGH (ref 65–99)

## 2016-05-30 LAB — VANCOMYCIN, TROUGH: VANCOMYCIN TR: 23 ug/mL — AB (ref 15–20)

## 2016-05-30 MED ORDER — INSULIN ASPART 100 UNIT/ML ~~LOC~~ SOLN
12.0000 [IU] | Freq: Three times a day (TID) | SUBCUTANEOUS | Status: DC
  Administered 2016-05-30: 12 [IU] via SUBCUTANEOUS

## 2016-05-30 MED ORDER — ADULT MULTIVITAMIN W/MINERALS CH
1.0000 | ORAL_TABLET | Freq: Every day | ORAL | Status: DC
  Administered 2016-05-30 – 2016-06-02 (×3): 1 via ORAL
  Filled 2016-05-30 (×4): qty 1

## 2016-05-30 MED ORDER — INSULIN GLARGINE 100 UNIT/ML ~~LOC~~ SOLN
52.0000 [IU] | Freq: Every day | SUBCUTANEOUS | Status: DC
  Administered 2016-05-30: 52 [IU] via SUBCUTANEOUS
  Filled 2016-05-30 (×2): qty 0.52

## 2016-05-30 MED ORDER — INSULIN GLARGINE 100 UNIT/ML ~~LOC~~ SOLN
12.0000 [IU] | Freq: Every day | SUBCUTANEOUS | Status: DC
  Administered 2016-05-30: 12 [IU] via SUBCUTANEOUS
  Filled 2016-05-30 (×2): qty 0.12

## 2016-05-30 MED ORDER — MAGNESIUM HYDROXIDE 400 MG/5ML PO SUSP
30.0000 mL | Freq: Every day | ORAL | Status: DC | PRN
  Administered 2016-05-30 – 2016-06-02 (×3): 30 mL via ORAL
  Filled 2016-05-30 (×3): qty 30

## 2016-05-30 MED ORDER — GLUCERNA SHAKE PO LIQD
237.0000 mL | Freq: Three times a day (TID) | ORAL | Status: DC
  Administered 2016-05-30 – 2016-06-02 (×9): 237 mL via ORAL
  Filled 2016-05-30 (×2): qty 237

## 2016-05-30 MED ORDER — PANTOPRAZOLE SODIUM 40 MG PO TBEC
40.0000 mg | DELAYED_RELEASE_TABLET | Freq: Every day | ORAL | Status: DC
  Administered 2016-05-30 – 2016-06-02 (×4): 40 mg via ORAL
  Filled 2016-05-30 (×4): qty 1

## 2016-05-30 MED ORDER — PREDNISONE 20 MG PO TABS
40.0000 mg | ORAL_TABLET | Freq: Every day | ORAL | Status: DC
  Administered 2016-05-31 – 2016-06-02 (×3): 40 mg via ORAL
  Filled 2016-05-30 (×3): qty 2

## 2016-05-30 MED ORDER — VITAMIN C 500 MG PO TABS
500.0000 mg | ORAL_TABLET | Freq: Two times a day (BID) | ORAL | Status: DC
  Administered 2016-05-30 – 2016-06-02 (×4): 500 mg via ORAL
  Filled 2016-05-30 (×6): qty 1

## 2016-05-30 MED ORDER — METOPROLOL TARTRATE 12.5 MG HALF TABLET
12.5000 mg | ORAL_TABLET | Freq: Two times a day (BID) | ORAL | Status: DC
  Administered 2016-06-01 – 2016-06-02 (×2): 12.5 mg via ORAL
  Filled 2016-05-30 (×5): qty 1

## 2016-05-30 MED ORDER — GUAIFENESIN-DM 100-10 MG/5ML PO SYRP: 5.0000 mL | ORAL_SOLUTION | ORAL | Status: DC | PRN

## 2016-05-30 MED ORDER — TORSEMIDE 20 MG PO TABS
20.0000 mg | ORAL_TABLET | Freq: Two times a day (BID) | ORAL | Status: DC
  Administered 2016-05-30 – 2016-06-02 (×6): 20 mg via ORAL
  Filled 2016-05-30 (×7): qty 1

## 2016-05-30 MED ORDER — POTASSIUM CHLORIDE 20 MEQ PO PACK
40.0000 meq | PACK | ORAL | Status: DC
  Administered 2016-05-31: 40 meq via ORAL
  Filled 2016-05-30 (×2): qty 2

## 2016-05-30 MED ORDER — VANCOMYCIN HCL IN DEXTROSE 1-5 GM/200ML-% IV SOLN
1000.0000 mg | Freq: Two times a day (BID) | INTRAVENOUS | Status: DC
  Administered 2016-05-30 – 2016-06-01 (×6): 1000 mg via INTRAVENOUS
  Filled 2016-05-30 (×8): qty 200

## 2016-05-30 NOTE — Progress Notes (Addendum)
Initial Nutrition Assessment  DOCUMENTATION CODES:   Morbid obesity  INTERVENTION:    Glucerna Shake PO TID, each supplement provides 220 kcal and 10 grams of protein  Multivitamin daily  NUTRITION DIAGNOSIS:   Inadequate oral intake related to poor appetite as evidenced by per patient/family report.  GOAL:   Patient will meet greater than or equal to 90% of their needs  MONITOR:   PO intake, Supplement acceptance, Labs, Skin  REASON FOR ASSESSMENT:   Low Braden    ASSESSMENT:   70 y.o. male admitted on 05/27/2016 with right-sided chest pain.  Found to have MRSA bacteremia and aspiration/HCAP.  Patient reports recent poor appetite. He says the food is good, but he just doesn't want to eat anything. He also does not have his dentures with him. Discussed the importance of adequate intake to support healing. Patient agreed to try Glucerna Shake supplements between meals to maximize protein and calorie intake. Will also add a MVI to promote healing. Nutrition-Focused physical exam completed. Findings are no fat depletion, mild-moderate muscle depletion, and mild edema.  Labs and medications reviewed. CBG's: [phone]   Diet Order:  Diet Carb Modified Fluid consistency: Thin; Room service appropriate? Yes  Skin:  Wound (see comment) (MASD to buttocks; R toe diabetic ulcer); multiple blisters/wounds on skin r/t bullous phemhigoid.  Last BM:  9/15  Height:   Ht Readings from Last 1 Encounters:  05/15/16 6\' 1"  (1.854 m)    Weight:   Wt Readings from Last 1 Encounters:  05/30/16 (!) 347 lb 7.1 oz (157.6 kg)    Ideal Body Weight:  83.6 kg  BMI:  Body mass index is 45.84 kg/m.  Estimated Nutritional Needs:   Kcal:  2400-2600  Protein:  120-150 gm   Fluid:  2.5 L  EDUCATION NEEDS:   No education needs identified at this time  Molli Barrows, [place], [place], [place] Pager [phone] After Hours Pager [phone]

## 2016-05-30 NOTE — Progress Notes (Addendum)
Pharmacy Antibiotic Note  [name] is a 70 y.o. male admitted on 05/27/2016 with right-sided chest pain.  Found to have MRSA bacteremia and aspiration/HCAP. Pharmacy has been consulted for vancomycin dosing.  Patient is also on Unasyn.  His renal function is stable and his vancomycin trough is slightly supra-therapeutic.  Plan: - Reduce vanc to 1000mg  IV Q12H - Unasyn 3gm IV Q8H per MD - Monitor renal fxn, clinical progress, weekly vanc trough to r/o accumulation - F/U CBGs  Weight: (!) 347 lb 7.1 oz (157.6 kg)  Temp (24hrs), Avg:98.4 F (36.9 C), Min:97.6 F (36.4 C), Max:98.8 F (37.1 C)   Recent Labs Lab 05/27/16 0633 05/27/16 0750 05/27/16 1252 05/27/16 1555 05/29/16 0225 05/29/16 0300 05/29/16 1748 05/29/16 2229 05/30/16 0751  WBC 29.3*  --   --   --   --  17.4*  --   --   --   CREATININE 1.12  --   --   --   --  1.10 1.00 1.06  --   LATICACIDVEN  --  2.44* 2.7* 1.9 1.2  --   --   --   --   VANCOTROUGH  --   --   --   --   --   --   --   --  23*    Estimated Creatinine Clearance: 104.7 mL/min (by C-G formula based on SCr of 1.06 mg/dL).    Allergies  Allergen Reactions  . Ace Inhibitors Swelling    Pt tolerates lisinopril  . Lipitor [Atorvastatin Calcium] Swelling  . Metformin And Related Swelling  . Cefadroxil Hives  . Cephalexin Hives  . Morphine And Related Other (See Comments)    Sweating, feels like is "in rocky boat."  . Robaxin [Methocarbamol] Other (See Comments)    Feels like he is shaky    Antimicrobials this admission:  Vanc 9/17 >> Zosyn 9/17 >> 9/18 Unasyn 9/19 >>  Dose adjustments this admission:  9/20 VT = 23 mcg/mL on 1500mg  q12 >> 1g q12 (SCr 1.06)  Microbiology results:  9/17 MRSA PCR - positive 9/17 BCx x2 - MRSA (BCID MRSA) 9/17 UCx - negative 9/18 BCx x2 - NGTD 9/19 sputum cx -    [name], PharmD, BCPS Pager:  [phone] 05/30/2016, 9:00 AM

## 2016-05-30 NOTE — Progress Notes (Signed)
PROGRESS NOTE        PATIENT DETAILS Name: [name] Age: 70 y.o. Sex: male Date of Birth: 02-Oct-1945 Admit Date: 05/27/2016 Admitting Physician Allie Bossier, MD [id number],DUKG [name], MD  Brief Narrative: Patient is a 70 y.o. male SNF resident, chronic Foley catheter in place, morbid obesity, sleep apnea on CPAP, history of CAD on medical treatment who presented with sepsis secondary to MRSA bacteremia and healthcare associated pneumonia.  Subjective: Feels much better. Denies any chest pain or shortness of breath.  Assessment/Plan: Principal Problem: Sepsis secondary to MRSA bacteremia and healthcare associated pneumonia: Continue empiric vancomycin and Unasyn. ID following and guiding antimicrobial treatment. Transthoracic echo negative for vegetation, ID not recommending TEE.  Active Problems: Diabetes mellitus due to underlying condition, uncontrolled: Probably secondary to IV steroids, increase Lantus to 52 units daily at bedtime, add 12 units of Lantus this morning, increase NovoLog to 12 units with meals. Follow. Suspect as steroids tapered off, CBGs will get better.A1c 11.7   Hyponatremia: Probably secondary to uncontrolled diabetes. Follow for now, attempt to control sugars better.  CAD:-cardiac catheterization in 2016 noted residual disease without any areas amenable to PCI therefore focus was on medical therapy. Continue aspirin.  History of bullous pemphigoid: On prednisone chronically prior to this admission-currently on IV to cortisone-we will now transition back to usual dosing of prednisone.  History of DVT:Not on chronic anticoagulation - d-dimer not elevated. On prophylactic Lovenox  Moisture associated skin damage B Buttocks:wound care as per [place] RN  Morbid obesity /OSA on CPAP:Bedbound and becomes orthostatic with attempts to mobilize - cont nocturnal CPAP  Chronic diastolic CHF / URK:YHCWCB daily weights and strict  Is/Os  Moderate aortic stenosis: Stable for outpatient follow-up  Hyperlipidemia:Not on statin prior to admission  Hx CVA   Neurogenic bladder: Chronic Foley catheter in place   DVT Prophylaxis: Prophylactic Lovenox   Code Status: Full code   Family Communication: None at bedside  Disposition Plan: Remain inpatient-but will plan on Home health vs SNF on discharge  Antimicrobial agents: See below  Procedures: (Echo 7/62>> normal systolic function, mild to moderate aortic stenosis.  CONSULTS:  ID  Time spent: 25- minutes-Greater than 50% of this time was spent in counseling, explanation of diagnosis, planning of further management, and coordination of care.  MEDICATIONS: Anti-infectives    Start     Dose/Rate Route Frequency Ordered Stop   05/30/16 1100  vancomycin (VANCOCIN) IVPB 1000 mg/200 mL premix     1,000 mg 200 mL/hr over 60 Minutes Intravenous Every 12 hours 05/30/16 0854     05/29/16 1200  Ampicillin-Sulbactam (UNASYN) 3 g in sodium chloride 0.9 % 100 mL IVPB     3 g 100 mL/hr over 60 Minutes Intravenous Every 8 hours 05/29/16 1042     05/27/16 2000  vancomycin (VANCOCIN) 1,500 mg in sodium chloride 0.9 % 500 mL IVPB  Status:  Discontinued     1,500 mg 250 mL/hr over 120 Minutes Intravenous Every 12 hours 05/27/16 0736 05/30/16 0845   05/27/16 1400  piperacillin-tazobactam (ZOSYN) IVPB 3.375 g  Status:  Discontinued     3.375 g 12.5 mL/hr over 240 Minutes Intravenous Every 8 hours 05/27/16 0753 05/28/16 1042   05/27/16 0830  vancomycin (VANCOCIN) 2,000 mg in sodium chloride 0.9 % 500 mL IVPB     2,000 mg 250 mL/hr over 120  Minutes Intravenous  Once 05/27/16 0736 05/27/16 1123   05/27/16 0745  piperacillin-tazobactam (ZOSYN) IVPB 3.375 g     3.375 g 100 mL/hr over 30 Minutes Intravenous STAT 05/27/16 0736 05/27/16 0839      Scheduled Meds: . ampicillin-sulbactam (UNASYN) IV  3 g Intravenous Q8H  . aspirin EC  81 mg Oral Daily  . Chlorhexidine  Gluconate Cloth  6 each Topical Q0600  . enoxaparin (LOVENOX) injection  40 mg Subcutaneous Q24H  . escitalopram  10 mg Oral Daily  . feeding supplement (GLUCERNA SHAKE)  237 mL Oral TID BM  . folic acid  1 mg Oral Daily  . gabapentin  100 mg Oral BID  . hydrocortisone sod succinate (SOLU-CORTEF) inj  50 mg Intravenous Q12H  . hydrOXYzine  25 mg Oral QID  . insulin aspart  0-20 Units Subcutaneous TID WC  . insulin aspart  0-5 Units Subcutaneous QHS  . insulin aspart  12 Units Subcutaneous TID WC  . insulin glargine  12 Units Subcutaneous Daily  . insulin glargine  52 Units Subcutaneous QHS  . ipratropium-albuterol  3 mL Nebulization TID  . magnesium oxide  400 mg Oral Daily  . multivitamin with minerals  1 tablet Oral Daily  . mupirocin ointment  1 application Nasal BID  . rOPINIRole  0.5 mg Oral QHS  . senna-docusate  2 tablet Oral QHS  . silver sulfADIAZINE   Topical Daily  . traZODone  25 mg Oral QHS  . vancomycin  1,000 mg Intravenous Q12H   Continuous Infusions: . sodium chloride 50 mL/hr at 05/28/16 2024   PRN Meds:.acetaminophen, guaiFENesin-dextromethorphan, magnesium hydroxide, ondansetron (ZOFRAN) IV, traMADol-acetaminophen   PHYSICAL EXAM: Vital signs: Vitals:   05/30/16 0417 05/30/16 0827 05/30/16 0918 05/30/16 1248  BP:   110/77 99/69  Pulse:      Resp:   19 (!) 29  Temp:   97.8 F (36.6 C) 98.5 F (36.9 C)  TempSrc:   Axillary Axillary  SpO2:  99%    Weight: (!) 157.6 kg (347 lb 7.1 oz)      Filed Weights   05/29/16 0401 05/30/16 0417  Weight: (!) 155.9 kg (343 lb 11.2 oz) (!) 157.6 kg (347 lb 7.1 oz)   Body mass index is 45.84 kg/m.   General appearance :Awake, alert, not in any distress. Speech Clear. Eyes:, pupils equally reactive to light and accomodation,no scleral icterus.Pink conjunctiva HEENT: Atraumatic and Normocephalic Neck: supple, no JVD. No cervical lymphadenopathy. No thyromegaly Resp:Good air entry bilaterally, no added sounds  CVS:  S1 S2 regular, + syst murmurs.  GI: Bowel sounds present, Non tender and not distended with no gaurding, rigidity or rebound.No organomegaly Extremities: B/L Lower Ext shows no edema, both legs are warm to touch Neurology:  speech clear,Non focal, sensation is grossly intact. Psychiatric: Normal judgment and insight. Alert and oriented x 3. Normal mood. Musculoskeletal:gait appears to be normal.No digital cyanosis  I have personally reviewed following labs and imaging studies  LABORATORY DATA: CBC:  Recent Labs Lab 05/27/16 0633 05/29/16 0300 05/30/16 1008  WBC 29.3* 17.4* 14.0*  HGB 13.6 11.2* 11.5*  HCT 40.1 34.7* 35.5*  MCV 88.9 90.6 91.0  PLT 227 214 944    Basic Metabolic Panel:  Recent Labs Lab 05/27/16 0633 05/29/16 0300 05/29/16 1748 05/29/16 2229 05/30/16 1008  NA 127* 131* 128* 127* 129*  K 4.9 4.2 4.4 4.7 4.6  CL 87* 97* 97* 97* 98*  CO2 26 25 22  20* 21*  GLUCOSE 587*  339* 444* 419* 413*  BUN 21* 26* 25* 24* 20  CREATININE 1.12 1.10 1.00 1.06 0.95  CALCIUM 8.8* 7.8* 7.7* 7.6* 7.9*    GFR: Estimated Creatinine Clearance: 116.8 mL/min (by C-G formula based on SCr of 0.95 mg/dL).  Liver Function Tests:  Recent Labs Lab 05/27/16 0720 05/29/16 0300  AST 15 45*  ALT 20 44  ALKPHOS 138* 209*  BILITOT 0.8 0.8  PROT 7.4 5.2*  ALBUMIN 2.8* 1.9*   No results for input(s): LIPASE, AMYLASE in the last 168 hours. No results for input(s): AMMONIA in the last 168 hours.  Coagulation Profile:  Recent Labs Lab 05/27/16 1107  INR 1.22    Cardiac Enzymes:  Recent Labs Lab 05/27/16 1107 05/27/16 1555 05/27/16 2209  TROPONINI 0.11* <0.03 <0.03    BNP (last 3 results) No results for input(s): PROBNP in the last 8760 hours.  HbA1C: No results for input(s): HGBA1C in the last 72 hours.  CBG:  Recent Labs Lab 05/29/16 2226 05/29/16 2351 05/30/16 0227 05/30/16 0915 05/30/16 1252  GLUCAP 415* 374* 327* 315* 435*    Lipid Profile: No  results for input(s): CHOL, HDL, LDLCALC, TRIG, CHOLHDL, LDLDIRECT in the last 72 hours.  Thyroid Function Tests: No results for input(s): TSH, T4TOTAL, FREET4, T3FREE, THYROIDAB in the last 72 hours.  Anemia Panel: No results for input(s): VITAMINB12, FOLATE, FERRITIN, TIBC, IRON, RETICCTPCT in the last 72 hours.  Urine analysis:    Component Value Date/Time   COLORURINE YELLOW 05/27/2016 1045   APPEARANCEUR TURBID (A) 05/27/2016 1045   LABSPEC 1.030 05/27/2016 1045   PHURINE 5.5 05/27/2016 1045   GLUCOSEU >1000 (A) 05/27/2016 1045   HGBUR MODERATE (A) 05/27/2016 1045   BILIRUBINUR NEGATIVE 05/27/2016 1045   KETONESUR 15 (A) 05/27/2016 1045   PROTEINUR 30 (A) 05/27/2016 1045   UROBILINOGEN 1.0 07/01/2015 1455   NITRITE POSITIVE (A) 05/27/2016 1045   LEUKOCYTESUR MODERATE (A) 05/27/2016 1045    Sepsis Labs: Lactic Acid, Venous    Component Value Date/Time   LATICACIDVEN 1.2 05/29/2016 0225    MICROBIOLOGY: Recent Results (from the past 240 hour(s))  Blood Culture (routine x 2)     Status: None (Preliminary result)   Collection Time: 05/27/16  7:55 AM  Result Value Ref Range Status   Specimen Description BLOOD RIGHT UPPER ARM  Final   Special Requests IN PEDIATRIC BOTTLE  2CC  Final   Culture NO GROWTH 3 DAYS  Final   Report Status PENDING  Incomplete  Blood Culture (routine x 2)     Status: Abnormal   Collection Time: 05/27/16  8:08 AM  Result Value Ref Range Status   Specimen Description BLOOD RIGHT UPPER ARM  Final   Special Requests BOTTLES DRAWN AEROBIC AND ANAEROBIC  5CC  Final   Culture  Setup Time   Final    IN BOTH AEROBIC AND ANAEROBIC BOTTLES GRAM POSITIVE COCCI IN CLUSTERS CRITICAL RESULT CALLED TO, READ BACK BY AND VERIFIED WITH: TO GABBOTT(PHARD) BY TCLEVELAND AT 1:59AM    Culture METHICILLIN RESISTANT STAPHYLOCOCCUS AUREUS (A)  Final   Report Status 05/30/2016 FINAL  Final   Organism ID, Bacteria METHICILLIN RESISTANT STAPHYLOCOCCUS AUREUS  Final       Susceptibility   Methicillin resistant staphylococcus aureus - MIC*    CIPROFLOXACIN 1 SENSITIVE Sensitive     ERYTHROMYCIN >=8 RESISTANT Resistant     GENTAMICIN <=0.5 SENSITIVE Sensitive     OXACILLIN >=4 RESISTANT Resistant     TETRACYCLINE >=16 RESISTANT Resistant  VANCOMYCIN 1 SENSITIVE Sensitive     TRIMETH/SULFA <=10 SENSITIVE Sensitive     CLINDAMYCIN <=0.25 RESISTANT Resistant     RIFAMPIN <=0.5 SENSITIVE Sensitive     Inducible Clindamycin POSITIVE Resistant     * METHICILLIN RESISTANT STAPHYLOCOCCUS AUREUS  Blood Culture ID Panel (Reflexed)     Status: Abnormal   Collection Time: 05/27/16  8:08 AM  Result Value Ref Range Status   Enterococcus species NOT DETECTED NOT DETECTED Final   Vancomycin resistance NOT DETECTED NOT DETECTED Final   Listeria monocytogenes NOT DETECTED NOT DETECTED Final   Staphylococcus species DETECTED (A) NOT DETECTED Final    Comment: CRITICAL RESULT CALLED TO, READ BACK BY AND VERIFIED WITH: TO GABBOTT(PHARD) BY TCLEVELAND AT 1:59AM    Staphylococcus aureus DETECTED (A) NOT DETECTED Final    Comment: CRITICAL RESULT CALLED TO, READ BACK BY AND VERIFIED WITH: TO GABBOTT(PHARD) BY TCLEVELAND AT 1:59AM    Methicillin resistance DETECTED (A) NOT DETECTED Final    Comment: CRITICAL RESULT CALLED TO, READ BACK BY AND VERIFIED WITH: TO GABBOTT(PHARD) BY TCLEVELAND AT 2:06    Streptococcus species NOT DETECTED NOT DETECTED Final   Streptococcus agalactiae NOT DETECTED NOT DETECTED Final   Streptococcus pneumoniae NOT DETECTED NOT DETECTED Final   Streptococcus pyogenes NOT DETECTED NOT DETECTED Final   Acinetobacter baumannii NOT DETECTED NOT DETECTED Final   Enterobacteriaceae species NOT DETECTED NOT DETECTED Final   Enterobacter cloacae complex NOT DETECTED NOT DETECTED Final   Escherichia coli NOT DETECTED NOT DETECTED Final   Klebsiella oxytoca NOT DETECTED NOT DETECTED Final   Klebsiella pneumoniae NOT DETECTED NOT DETECTED Final    Proteus species NOT DETECTED NOT DETECTED Final   Serratia marcescens NOT DETECTED NOT DETECTED Final   Carbapenem resistance NOT DETECTED NOT DETECTED Final   Haemophilus influenzae NOT DETECTED NOT DETECTED Final   Neisseria meningitidis NOT DETECTED NOT DETECTED Final   Pseudomonas aeruginosa NOT DETECTED NOT DETECTED Final   Candida albicans NOT DETECTED NOT DETECTED Final   Candida glabrata NOT DETECTED NOT DETECTED Final   Candida krusei NOT DETECTED NOT DETECTED Final   Candida parapsilosis NOT DETECTED NOT DETECTED Final   Candida tropicalis NOT DETECTED NOT DETECTED Final  Urine culture     Status: Abnormal   Collection Time: 05/27/16 10:45 AM  Result Value Ref Range Status   Specimen Description URINE, CATHETERIZED  Final   Special Requests NONE  Final   Culture MULTIPLE SPECIES PRESENT, SUGGEST RECOLLECTION (A)  Final   Report Status 05/28/2016 FINAL  Final  MRSA PCR Screening     Status: Abnormal   Collection Time: 05/27/16 12:48 PM  Result Value Ref Range Status   MRSA by PCR POSITIVE (A) NEGATIVE Final    Comment:        The GeneXpert MRSA Assay (FDA approved for NASAL specimens only), is one component of a comprehensive MRSA colonization surveillance program. It is not intended to diagnose MRSA infection nor to guide or monitor treatment for MRSA infections. RESULT CALLED TO, READ BACK BY AND VERIFIED WITH: S VIVERITO,RN AT 1618 05/27/16 BY L BENFIELD   Culture, blood (Routine X 2) w Reflex to ID Panel     Status: None (Preliminary result)   Collection Time: 05/28/16 12:15 PM  Result Value Ref Range Status   Specimen Description BLOOD RIGHT HAND  Final   Special Requests IN PEDIATRIC BOTTLE 1CC  Final   Culture NO GROWTH 2 DAYS  Final   Report Status PENDING  Incomplete  Culture, expectorated sputum-assessment     Status: None   Collection Time: 05/29/16 11:48 AM  Result Value Ref Range Status   Specimen Description SPUTUM  Final   Special Requests NONE   Final   Sputum evaluation   Final    THIS SPECIMEN IS ACCEPTABLE. RESPIRATORY CULTURE REPORT TO FOLLOW.   Report Status 05/29/2016 FINAL  Final  Urine culture     Status: None (Preliminary result)   Collection Time: 05/29/16 11:48 AM  Result Value Ref Range Status   Specimen Description URINE, RANDOM  Final   Special Requests NONE  Final   Culture CULTURE REINCUBATED FOR BETTER GROWTH  Final   Report Status PENDING  Incomplete  Culture, respiratory (NON-Expectorated)     Status: None (Preliminary result)   Collection Time: 05/29/16 11:48 AM  Result Value Ref Range Status   Specimen Description EXPECTORATED SPUTUM  Final   Special Requests NONE  Final   Gram Stain   Final    ABUNDANT WBC PRESENT, PREDOMINANTLY PMN RARE GRAM POSITIVE COCCI IN PAIRS RARE SQUAMOUS EPITHELIAL CELLS PRESENT    Culture MODERATE STAPHYLOCOCCUS AUREUS  Final   Report Status PENDING  Incomplete    RADIOLOGY STUDIES/RESULTS: Ct Chest W Contrast  Result Date: 05/28/2016 CLINICAL DATA:  70 year old male inpatient with a reported history of right supraclavicular lymphadenopathy and tenderness. Bacteremia. EXAM: CT CHEST WITH CONTRAST TECHNIQUE: Multidetector CT imaging of the chest was performed during intravenous contrast administration. CONTRAST:  2mL ISOVUE-300 IOPAMIDOL (ISOVUE-300) INJECTION 61% COMPARISON:  Chest radiograph from earlier today. 09/07/2015 chest CT. FINDINGS: Cardiovascular: New mild cardiomegaly. Trace pericardial effusion/ thickening. Left anterior descending, left circumflex and right coronary atherosclerosis. Atherosclerotic nonaneurysmal thoracic aorta. Normal caliber pulmonary arteries. No central pulmonary emboli. Mediastinum/Nodes: No discrete thyroid nodules. Unremarkable esophagus. No axillary adenopathy. New mild right paratracheal adenopathy measuring up to 1.4 cm (series 3/ image 47). New mild subcarinal adenopathy measuring up to 1.4 cm (series 3/image 60). No additional  pathologically enlarged mediastinal or hilar nodes. Lungs/Pleura: No pneumothorax. Small right pleural effusion. No left pleural effusion. There is near complete consolidation of the right lower lobe, with some associated volume loss in the right lower lobe, and focal mild cavitary change in the basilar right lower lobe. Mild compressive atelectasis in the dependent right upper and right middle lobes. Mild hypoventilatory change in the dependent left lower lobe. No significant pulmonary nodules in the aerated portions of the lungs. Upper abdomen: Cholecystectomy. Surgical sutures and clips are noted in the region of the proximal stomach, which causes streak artifact and limits visualization in this region. Musculoskeletal: No aggressive appearing focal osseous lesions. Stable small sclerotic lesion in the T5 vertebral body, probably a benign bone island. Moderate thoracic spondylosis. Moderate symmetric gynecomastia appears stable. IMPRESSION: 1. Chest CT findings are most suggestive of a cavitary right lower lobe pneumonia. 2. Small parapneumonic right pleural effusion with mild compressive atelectasis in the dependent right lung. 3. New mild right paratracheal and subcarinal lymphadenopathy, nonspecific, possibly reactive. 4. A follow-up chest CT with IV contrast is recommended in 3 months to document resolution of these findings. 5. New mild cardiomegaly.  Trace pericardial effusion/thickening. 6. Aortic atherosclerosis.  Three-vessel coronary atherosclerosis. Electronically Signed   By: Ilona Sorrel M.D.   On: 05/28/2016 17:21   Dg Chest Port 1 View  Result Date: 05/28/2016 CLINICAL DATA:  Sepsis EXAM: PORTABLE CHEST 1 VIEW COMPARISON:  05/27/2016 FINDINGS: Cardiomegaly with vascular congestion. Bibasilar atelectasis, right greater than left. Mild interstitial prominence throughout the  lungs could reflect mild interstitial edema. This is increased slightly since prior study. IMPRESSION: Low lung volumes with  bibasilar atelectasis. Cardiomegaly with vascular congestion and possible mild interstitial edema. Electronically Signed   By: Rolm Baptise M.D.   On: 05/28/2016 07:41   Dg Chest Portable 1 View  Result Date: 05/27/2016 CLINICAL DATA:  Right side chest pain and SOB x this morning. Hx of HTN, diabetes, CAD, CHF. EXAM: PORTABLE CHEST 1 VIEW COMPARISON:  01/11/2016 FINDINGS: Normal cardiac silhouette. There are low lung volumes with bibasilar atelectasis not changed. No pneumothorax. IMPRESSION: Low lung volumes and bibasilar atelectasis.  No change. Electronically Signed   By: Suzy Bouchard M.D.   On: 05/27/2016 08:37     LOS: 3 days   Oren Binet, MD  [facility] Pager:[phone]  If 7PM-7AM, please contact night-coverage [email or web address] Password [facility] 05/30/2016, 2:37 PM

## 2016-05-30 NOTE — Progress Notes (Signed)
INFECTIOUS DISEASE PROGRESS NOTE  ID: [name] is a 70 y.o. male with  Principal Problem:   Sepsis ([place]) Active Problems:   Diabetes mellitus due to underlying condition, uncontrolled, with hyperosmolarity without coma, with long-term current use of insulin (HCC)   OSA (obstructive sleep apnea)   Chronic indwelling Foley catheter   Chronic diastolic CHF (congestive heart failure) (HCC)   History of DVT (deep vein thrombosis)   Pressure ulcer   Neurogenic bladder   Hyperlipidemia LDL goal <70   Morbid obesity (HCC)   CAD in native artery   H/O: CVA (cerebrovascular accident)   Bullous pemphigus   OSA on CPAP   Chest pain   MRSA bacteremia   Severe sepsis ([name])   Cavitary pneumonia   HCAP (healthcare-associated pneumonia)   Uncontrolled type 2 diabetes mellitus with complication (HCC)  Subjective: [name] states that his chest pain with breathing has improved but he continues to have a productive cough with sputum that looks like "dark brown dirt" and has some blood.   Abtx:  Anti-infectives    Start     Dose/Rate Route Frequency Ordered Stop   05/30/16 1100  vancomycin (VANCOCIN) IVPB 1000 mg/200 mL premix     1,000 mg 200 mL/hr over 60 Minutes Intravenous Every 12 hours 05/30/16 0854     05/29/16 1200  Ampicillin-Sulbactam (UNASYN) 3 g in sodium chloride 0.9 % 100 mL IVPB     3 g 100 mL/hr over 60 Minutes Intravenous Every 8 hours 05/29/16 1042     05/27/16 2000  vancomycin (VANCOCIN) 1,500 mg in sodium chloride 0.9 % 500 mL IVPB  Status:  Discontinued     1,500 mg 250 mL/hr over 120 Minutes Intravenous Every 12 hours 05/27/16 0736 05/30/16 0845   05/27/16 1400  piperacillin-tazobactam (ZOSYN) IVPB 3.375 g  Status:  Discontinued     3.375 g 12.5 mL/hr over 240 Minutes Intravenous Every 8 hours 05/27/16 0753 05/28/16 1042   05/27/16 0830  vancomycin (VANCOCIN) 2,000 mg in sodium chloride 0.9 % 500 mL IVPB     2,000 mg 250 mL/hr over 120 Minutes Intravenous   Once 05/27/16 0736 05/27/16 1123   05/27/16 0745  piperacillin-tazobactam (ZOSYN) IVPB 3.375 g     3.375 g 100 mL/hr over 30 Minutes Intravenous STAT 05/27/16 0736 05/27/16 0839      Medications: I have reviewed the patient's current medications.  Objective: Vital signs in last 24 hours: Temp:  [97.6 F (36.4 C)-98.8 F (37.1 C)] 97.8 F (36.6 C) (09/20 0918) Pulse Rate:  [96-100] 96 (09/20 0408) Resp:  [19-26] 19 (09/20 0918) BP: (93-138)/(55-77) 110/77 (09/20 0918) SpO2:  [93 %-99 %] 99 % (09/20 0827) Weight:  [347 lb 7.1 oz (157.6 kg)] 347 lb 7.1 oz (157.6 kg) (09/20 0417)  General appearance: alert, cooperative and no distress Resp: decreased breath sounds over right lung fields  Cardio: regular rate and rhythm, no murmurs, rubs or gallops  GI: normal findings: bowel sounds normal, soft, non-tender Extremities: multiple lesions on BLE. less erythema, one lesion 7 cm lesion on his left shin has pustular drainage   Lab Results  Recent Labs  05/29/16 0300  05/29/16 2229 05/30/16 1008  WBC 17.4*  --   --  14.0*  HGB 11.2*  --   --  11.5*  HCT 34.7*  --   --  35.5*  NA 131*  < > 127* 129*  K 4.2  < > 4.7 4.6  CL 97*  < >  97* 98*  CO2 25  < > 20* 21*  BUN 26*  < > 24* 20  CREATININE 1.10  < > 1.06 0.95  < > = values in this interval not displayed. Liver Panel  Recent Labs  05/29/16 0300  PROT 5.2*  ALBUMIN 1.9*  AST 45*  ALT 44  ALKPHOS 209*  BILITOT 0.8   Microbiology: Recent Results (from the past 240 hour(s))  Blood Culture (routine x 2)     Status: None (Preliminary result)   Collection Time: 05/27/16  7:55 AM  Result Value Ref Range Status   Specimen Description BLOOD RIGHT UPPER ARM  Final   Special Requests IN PEDIATRIC BOTTLE  2CC  Final   Culture NO GROWTH 3 DAYS  Final   Report Status PENDING  Incomplete  Blood Culture (routine x 2)     Status: Abnormal   Collection Time: 05/27/16  8:08 AM  Result Value Ref Range Status   Specimen  Description BLOOD RIGHT UPPER ARM  Final   Special Requests BOTTLES DRAWN AEROBIC AND ANAEROBIC  5CC  Final   Culture  Setup Time   Final    IN BOTH AEROBIC AND ANAEROBIC BOTTLES GRAM POSITIVE COCCI IN CLUSTERS CRITICAL RESULT CALLED TO, READ BACK BY AND VERIFIED WITH: TO GABBOTT(PHARD) BY TCLEVELAND AT 1:59AM    Culture METHICILLIN RESISTANT STAPHYLOCOCCUS AUREUS (A)  Final   Report Status 05/30/2016 FINAL  Final   Organism ID, Bacteria METHICILLIN RESISTANT STAPHYLOCOCCUS AUREUS  Final      Susceptibility   Methicillin resistant staphylococcus aureus - MIC*    CIPROFLOXACIN 1 SENSITIVE Sensitive     ERYTHROMYCIN >=8 RESISTANT Resistant     GENTAMICIN <=0.5 SENSITIVE Sensitive     OXACILLIN >=4 RESISTANT Resistant     TETRACYCLINE >=16 RESISTANT Resistant     VANCOMYCIN 1 SENSITIVE Sensitive     TRIMETH/SULFA <=10 SENSITIVE Sensitive     CLINDAMYCIN <=0.25 RESISTANT Resistant     RIFAMPIN <=0.5 SENSITIVE Sensitive     Inducible Clindamycin POSITIVE Resistant     * METHICILLIN RESISTANT STAPHYLOCOCCUS AUREUS  Blood Culture ID Panel (Reflexed)     Status: Abnormal   Collection Time: 05/27/16  8:08 AM  Result Value Ref Range Status   Enterococcus species NOT DETECTED NOT DETECTED Final   Vancomycin resistance NOT DETECTED NOT DETECTED Final   Listeria monocytogenes NOT DETECTED NOT DETECTED Final   Staphylococcus species DETECTED (A) NOT DETECTED Final    Comment: CRITICAL RESULT CALLED TO, READ BACK BY AND VERIFIED WITH: TO GABBOTT(PHARD) BY TCLEVELAND AT 1:59AM    Staphylococcus aureus DETECTED (A) NOT DETECTED Final    Comment: CRITICAL RESULT CALLED TO, READ BACK BY AND VERIFIED WITH: TO GABBOTT(PHARD) BY TCLEVELAND AT 1:59AM    Methicillin resistance DETECTED (A) NOT DETECTED Final    Comment: CRITICAL RESULT CALLED TO, READ BACK BY AND VERIFIED WITH: TO GABBOTT(PHARD) BY TCLEVELAND AT 2:06    Streptococcus species NOT DETECTED NOT DETECTED Final   Streptococcus agalactiae  NOT DETECTED NOT DETECTED Final   Streptococcus pneumoniae NOT DETECTED NOT DETECTED Final   Streptococcus pyogenes NOT DETECTED NOT DETECTED Final   Acinetobacter baumannii NOT DETECTED NOT DETECTED Final   Enterobacteriaceae species NOT DETECTED NOT DETECTED Final   Enterobacter cloacae complex NOT DETECTED NOT DETECTED Final   Escherichia coli NOT DETECTED NOT DETECTED Final   Klebsiella oxytoca NOT DETECTED NOT DETECTED Final   Klebsiella pneumoniae NOT DETECTED NOT DETECTED Final   Proteus species NOT DETECTED NOT  DETECTED Final   Serratia marcescens NOT DETECTED NOT DETECTED Final   Carbapenem resistance NOT DETECTED NOT DETECTED Final   Haemophilus influenzae NOT DETECTED NOT DETECTED Final   Neisseria meningitidis NOT DETECTED NOT DETECTED Final   Pseudomonas aeruginosa NOT DETECTED NOT DETECTED Final   Candida albicans NOT DETECTED NOT DETECTED Final   Candida glabrata NOT DETECTED NOT DETECTED Final   Candida krusei NOT DETECTED NOT DETECTED Final   Candida parapsilosis NOT DETECTED NOT DETECTED Final   Candida tropicalis NOT DETECTED NOT DETECTED Final  Urine culture     Status: Abnormal   Collection Time: 05/27/16 10:45 AM  Result Value Ref Range Status   Specimen Description URINE, CATHETERIZED  Final   Special Requests NONE  Final   Culture MULTIPLE SPECIES PRESENT, SUGGEST RECOLLECTION (A)  Final   Report Status 05/28/2016 FINAL  Final  MRSA PCR Screening     Status: Abnormal   Collection Time: 05/27/16 12:48 PM  Result Value Ref Range Status   MRSA by PCR POSITIVE (A) NEGATIVE Final    Comment:        The GeneXpert MRSA Assay (FDA approved for NASAL specimens only), is one component of a comprehensive MRSA colonization surveillance program. It is not intended to diagnose MRSA infection nor to guide or monitor treatment for MRSA infections. RESULT CALLED TO, READ BACK BY AND VERIFIED WITH: S VIVERITO,RN AT 1618 05/27/16 BY L BENFIELD   Culture, blood (Routine  X 2) w Reflex to ID Panel     Status: None (Preliminary result)   Collection Time: 05/28/16 12:15 PM  Result Value Ref Range Status   Specimen Description BLOOD RIGHT HAND  Final   Special Requests IN PEDIATRIC BOTTLE 1CC  Final   Culture NO GROWTH 2 DAYS  Final   Report Status PENDING  Incomplete  Culture, expectorated sputum-assessment     Status: None   Collection Time: 05/29/16 11:48 AM  Result Value Ref Range Status   Specimen Description SPUTUM  Final   Special Requests NONE  Final   Sputum evaluation   Final    THIS SPECIMEN IS ACCEPTABLE. RESPIRATORY CULTURE REPORT TO FOLLOW.   Report Status 05/29/2016 FINAL  Final  Urine culture     Status: None (Preliminary result)   Collection Time: 05/29/16 11:48 AM  Result Value Ref Range Status   Specimen Description URINE, RANDOM  Final   Special Requests NONE  Final   Culture CULTURE REINCUBATED FOR BETTER GROWTH  Final   Report Status PENDING  Incomplete  Culture, respiratory (NON-Expectorated)     Status: None (Preliminary result)   Collection Time: 05/29/16 11:48 AM  Result Value Ref Range Status   Specimen Description EXPECTORATED SPUTUM  Final   Special Requests NONE  Final   Gram Stain   Final    ABUNDANT WBC PRESENT, PREDOMINANTLY PMN RARE GRAM POSITIVE COCCI IN PAIRS RARE SQUAMOUS EPITHELIAL CELLS PRESENT    Culture MODERATE STAPHYLOCOCCUS AUREUS  Final   Report Status PENDING  Incomplete    Studies/Results: Ct Chest W Contrast  Result Date: 05/28/2016 CLINICAL DATA:  70 year old male inpatient with a reported history of right supraclavicular lymphadenopathy and tenderness. Bacteremia. EXAM: CT CHEST WITH CONTRAST TECHNIQUE: Multidetector CT imaging of the chest was performed during intravenous contrast administration. CONTRAST:  91mL ISOVUE-300 IOPAMIDOL (ISOVUE-300) INJECTION 61% COMPARISON:  Chest radiograph from earlier today. 09/07/2015 chest CT. FINDINGS: Cardiovascular: New mild cardiomegaly. Trace pericardial  effusion/ thickening. Left anterior descending, left circumflex and right coronary atherosclerosis.  Atherosclerotic nonaneurysmal thoracic aorta. Normal caliber pulmonary arteries. No central pulmonary emboli. Mediastinum/Nodes: No discrete thyroid nodules. Unremarkable esophagus. No axillary adenopathy. New mild right paratracheal adenopathy measuring up to 1.4 cm (series 3/ image 47). New mild subcarinal adenopathy measuring up to 1.4 cm (series 3/image 60). No additional pathologically enlarged mediastinal or hilar nodes. Lungs/Pleura: No pneumothorax. Small right pleural effusion. No left pleural effusion. There is near complete consolidation of the right lower lobe, with some associated volume loss in the right lower lobe, and focal mild cavitary change in the basilar right lower lobe. Mild compressive atelectasis in the dependent right upper and right middle lobes. Mild hypoventilatory change in the dependent left lower lobe. No significant pulmonary nodules in the aerated portions of the lungs. Upper abdomen: Cholecystectomy. Surgical sutures and clips are noted in the region of the proximal stomach, which causes streak artifact and limits visualization in this region. Musculoskeletal: No aggressive appearing focal osseous lesions. Stable small sclerotic lesion in the T5 vertebral body, probably a benign bone island. Moderate thoracic spondylosis. Moderate symmetric gynecomastia appears stable. IMPRESSION: 1. Chest CT findings are most suggestive of a cavitary right lower lobe pneumonia. 2. Small parapneumonic right pleural effusion with mild compressive atelectasis in the dependent right lung. 3. New mild right paratracheal and subcarinal lymphadenopathy, nonspecific, possibly reactive. 4. A follow-up chest CT with IV contrast is recommended in 3 months to document resolution of these findings. 5. New mild cardiomegaly.  Trace pericardial effusion/thickening. 6. Aortic atherosclerosis.  Three-vessel coronary  atherosclerosis. Electronically Signed   By: Ilona Sorrel M.D.   On: 05/28/2016 17:21   Assessment/Plan: MRSA bacteremia Cellulitis R supraclavicular fossa LN [place] phemigoid Uncontrolled DM2  -continue unasyn for an 8 day course to treat aspiration/HCAP  -continue vancomycin for a 14 day course to treat MRSA bacteremia   Total days of antibiotics: 3 vanco, 1 unasyn         Ledell Noss Infectious Diseases (pager) [phone] [email or web address] 05/30/2016, 11:27 AM  LOS: 3 days

## 2016-05-30 NOTE — Progress Notes (Signed)
Pt. refused CPAP at this time, unit remains at bedside, pt. on room air in no distress.

## 2016-05-31 LAB — GLUCOSE, CAPILLARY
GLUCOSE-CAPILLARY: 362 mg/dL — AB (ref 65–99)
GLUCOSE-CAPILLARY: 465 mg/dL — AB (ref 65–99)
GLUCOSE-CAPILLARY: 447 mg/dL — AB (ref 65–99)
GLUCOSE-CAPILLARY: 416 mg/dL — AB (ref 65–99)

## 2016-05-31 LAB — BASIC METABOLIC PANEL
Anion gap: 7 (ref 5–15)
BUN: 19 mg/dL (ref 6–20)
CO2: 24 mmol/L (ref 22–32)
CREATININE: 0.91 mg/dL (ref 0.61–1.24)
Calcium: 8 mg/dL — ABNORMAL LOW (ref 8.9–10.3)
Chloride: 98 mmol/L — ABNORMAL LOW (ref 101–111)
Glucose, Bld: 339 mg/dL — ABNORMAL HIGH (ref 65–99)
POTASSIUM: 4.4 mmol/L (ref 3.5–5.1)
SODIUM: 129 mmol/L — AB (ref 135–145)

## 2016-05-31 MED ORDER — IPRATROPIUM-ALBUTEROL 0.5-2.5 (3) MG/3ML IN SOLN: 3.0000 mL | RESPIRATORY_TRACT | Status: DC | PRN

## 2016-05-31 MED ORDER — INSULIN GLARGINE 100 UNIT/ML ~~LOC~~ SOLN: 26.0000 [IU] | Freq: Every day | SUBCUTANEOUS | Status: DC

## 2016-05-31 MED ORDER — INSULIN ASPART 100 UNIT/ML ~~LOC~~ SOLN: 0.0000 [IU] | Freq: Three times a day (TID) | SUBCUTANEOUS | Status: DC

## 2016-05-31 MED ORDER — POTASSIUM CHLORIDE CRYS ER 20 MEQ PO TBCR
40.0000 meq | EXTENDED_RELEASE_TABLET | Freq: Every day | ORAL | Status: DC
  Administered 2016-06-01: 40 meq via ORAL
  Filled 2016-05-31 (×2): qty 2

## 2016-05-31 MED ORDER — INSULIN ASPART 100 UNIT/ML ~~LOC~~ SOLN
10.0000 [IU] | Freq: Once | SUBCUTANEOUS | Status: AC
  Administered 2016-06-01: 10 [IU] via SUBCUTANEOUS

## 2016-05-31 MED ORDER — INSULIN ASPART 100 UNIT/ML ~~LOC~~ SOLN
20.0000 [IU] | Freq: Three times a day (TID) | SUBCUTANEOUS | Status: DC
  Administered 2016-06-01 – 2016-06-02 (×4): 20 [IU] via SUBCUTANEOUS

## 2016-05-31 MED ORDER — INSULIN ASPART 100 UNIT/ML ~~LOC~~ SOLN
16.0000 [IU] | Freq: Three times a day (TID) | SUBCUTANEOUS | Status: DC
  Administered 2016-05-31: 16 [IU] via SUBCUTANEOUS

## 2016-05-31 MED ORDER — INSULIN GLARGINE 100 UNIT/ML ~~LOC~~ SOLN
65.0000 [IU] | Freq: Every day | SUBCUTANEOUS | Status: DC
  Filled 2016-05-31: qty 0.65

## 2016-05-31 MED ORDER — INSULIN ASPART 100 UNIT/ML ~~LOC~~ SOLN
18.0000 [IU] | Freq: Three times a day (TID) | SUBCUTANEOUS | Status: DC
  Administered 2016-05-31: 18 [IU] via SUBCUTANEOUS

## 2016-05-31 MED ORDER — INSULIN GLARGINE 100 UNIT/ML ~~LOC~~ SOLN
22.0000 [IU] | Freq: Every day | SUBCUTANEOUS | Status: DC
  Administered 2016-05-31: 22 [IU] via SUBCUTANEOUS
  Filled 2016-05-31: qty 0.22

## 2016-05-31 MED ORDER — INSULIN ASPART 100 UNIT/ML ~~LOC~~ SOLN
0.0000 [IU] | Freq: Every day | SUBCUTANEOUS | Status: DC
  Administered 2016-06-01: 4 [IU] via SUBCUTANEOUS

## 2016-05-31 MED ORDER — INSULIN GLARGINE 100 UNIT/ML ~~LOC~~ SOLN
60.0000 [IU] | Freq: Every day | SUBCUTANEOUS | Status: DC
  Filled 2016-05-31: qty 0.6

## 2016-05-31 MED ORDER — INSULIN GLARGINE 100 UNIT/ML ~~LOC~~ SOLN
70.0000 [IU] | Freq: Every day | SUBCUTANEOUS | Status: DC
  Administered 2016-06-01 (×2): 70 [IU] via SUBCUTANEOUS
  Filled 2016-05-31 (×3): qty 0.7

## 2016-05-31 MED ORDER — INSULIN GLARGINE 100 UNIT/ML ~~LOC~~ SOLN: 70.0000 [IU] | Freq: Every day | SUBCUTANEOUS | Status: DC

## 2016-05-31 MED ORDER — INSULIN GLARGINE 100 UNIT/ML ~~LOC~~ SOLN
30.0000 [IU] | Freq: Every day | SUBCUTANEOUS | Status: DC
  Administered 2016-06-01 – 2016-06-02 (×2): 30 [IU] via SUBCUTANEOUS
  Filled 2016-05-31 (×2): qty 0.3

## 2016-05-31 NOTE — Evaluation (Addendum)
Physical Therapy Evaluation and D/C Patient Details Name: [name] MRN: [id number] DOB: 1946-04-28 Today's Date: 05/31/2016   History of Present Illness  Patient is a 70 y.o. male SNF resident, chronic Foley catheter in place, morbid obesity, sleep apnea on CPAP, history of CAD on medical treatment who presented with sepsis secondary to MRSA bacteremia and healthcare associated pneumonia.  Clinical Impression  Pt admitted with above diagnosis. Pt currently with functional limitations however was bedbound at facility. Has been unsuccessful in getting to EOB or even up to wheelchair due to orthostasis per pt and per previous stay.  Pt is at baseline currently.  REcommend OT consult for recommendations for UE strengthening.  Also recommend air mattress overlay.  MD please order if you agree.  Pt will not benefit from skilled PT at this time.  Will sign off.    Follow Up Recommendations No PT follow up;Supervision/Assistance - 24 hour    Equipment Recommendations  None recommended by PT    Recommendations for Other Services OT consult     Precautions / Restrictions Precautions Precautions: Fall Restrictions Weight Bearing Restrictions: No      Mobility  Bed Mobility Overal bed mobility: Needs Assistance;+2 for physical assistance Bed Mobility: Rolling Rolling: Max assist         General bed mobility comments: Needed max assist to roll to place pillow under right side as pt complaining of buttocks sore.    Transfers                 General transfer comment: pt passes out when he tries to sit up.  Bedbound at SNF.   Ambulation/Gait                Stairs            Wheelchair Mobility    Modified Rankin (Stroke Patients Only)       Balance                                             Pertinent Vitals/Pain Pain Assessment: Faces Faces Pain Scale: Hurts little more Pain Location: generalized with rolling Pain Descriptors /  Indicators: Aching Pain Intervention(s): Limited activity within patient's tolerance;Monitored during session;Repositioned  VSS    Home Living Family/patient expects to be discharged to:: Skilled nursing facility                      Prior Function Level of Independence: Needs assistance   Gait / Transfers Assistance Needed: non ambulatory due to passing out; reports doesn't get up to sit much either.  Pt reports they use lift to get pt OOB.   ADL's / Homemaking Assistance Needed: assist at facility  Comments: Pt reports he feeds himself and does upper body bathing and dressing.      Hand Dominance   Dominant Hand: Right    Extremity/Trunk Assessment   Upper Extremity Assessment: Defer to OT evaluation           Lower Extremity Assessment: RLE deficits/detail;LLE deficits/detail RLE Deficits / Details: grossly 2+/5 LLE Deficits / Details: grossly 2+/5     Communication   Communication: No difficulties  Cognition Arousal/Alertness: Awake/alert Behavior During Therapy: WFL for tasks assessed/performed Overall Cognitive Status: Within Functional Limits for tasks assessed  General Comments General comments (skin integrity, edema, etc.): Discussed positioning and assisted pt with positioning.      Exercises Other Exercises Other Exercises: Pt performs UE exercises with 5 lb wts at facility.    Assessment/Plan    PT Assessment Patent does not need any further PT services  PT Problem List            PT Treatment Interventions      PT Goals (Current goals can be found in the Care Plan section)  Acute Rehab PT Goals PT Goal Formulation: All assessment and education complete, DC therapy    Frequency     Barriers to discharge        Co-evaluation               End of Session Equipment Utilized During Treatment: Oxygen Activity Tolerance: Patient limited by fatigue Patient left: in bed;with call bell/phone within  reach;with bed alarm set Nurse Communication: Mobility status;Need for lift equipment         Time: 1218-1227 PT Time Calculation (min) (ACUTE ONLY): 9 min   Charges:   PT Evaluation $PT Eval Moderate Complexity: 1 Procedure     PT G CodesDenice Paradise 06/08/16, 2:04 PM Maryann Mccall,PT Acute Rehabilitation [phone] (pager)

## 2016-05-31 NOTE — Progress Notes (Signed)
PROGRESS NOTE        PATIENT DETAILS Name: [name] Age: 70 y.o. Sex: male Date of Birth: 1945/10/22 Admit Date: 05/27/2016 Admitting Physician Allie Bossier, MD [id number] [name], MD  Brief Narrative: Patient is a 70 y.o. male SNF resident, chronic Foley catheter in place, morbid obesity, sleep apnea on CPAP, history of CAD on medical treatment who presented with sepsis secondary to MRSA bacteremia and healthcare associated pneumonia.  Subjective: Feels better. Denies any chest pain or shortness of breath.  Assessment/Plan: Principal Problem: Sepsis secondary to MRSA bacteremia and healthcare associated pneumonia: Continue empiric vancomycin, Unasyn discontinued on 9/21. Sputum cultures also positive for Staphylococcus. Blood cultures positive for MRSA, repeat blood cultures on 9/18 negative so far. ID recommending a total of 3 weeks of intravenous vancomycin. Place PICC line. Transthoracic echo negative for vegetation, ID not recommending TEE.  Active Problems: Diabetes mellitus due to underlying condition, uncontrolled: Probably secondary to steroids, increase Lantus to 60 units daily at bedtime, increase a.m. Lantus to 16 units, increase pre-meal NovoLog to 16 units. Will continue to follow and adjust accordingly. A1c 11.7   Hyponatremia: Probably secondary to uncontrolled diabetes. Follow for now, attempt to control sugars better.  CAD:-cardiac catheterization in 2016 noted residual disease without any areas amenable to PCI therefore focus was on medical therapy. Continue aspirin.  History of bullous pemphigoid: On prednisone chronically prior to this initially on intravenous hydrocortisone, transitioned back to usual dosing of prednisone   History of DVT:Not on chronic anticoagulation - d-dimer not elevated. On prophylactic Lovenox  Moisture associated skin damage B Buttocks:wound care as per [place] RN  Morbid obesity /OSA on CPAP:Bedbound  and becomes orthostatic with attempts to mobilize - cont nocturnal CPAP  Chronic diastolic CHF / ERD:EYCXKG daily weights and strict Is/Os  Moderate aortic stenosis: Stable for outpatient follow-up  Hyperlipidemia:Not on statin prior to admission  Hx CVA   Neurogenic bladder: Chronic Foley catheter in place   DVT Prophylaxis: Prophylactic Lovenox   Code Status: Full code   Family Communication: None at bedside  Disposition Plan: Remain inpatient- SNF on discharge-likely on 9/22  Antimicrobial agents: See below  Procedures: Echo 8/18>> normal systolic function, mild to moderate aortic stenosis.  CONSULTS:  ID  Time spent: 25- minutes-Greater than 50% of this time was spent in counseling, explanation of diagnosis, planning of further management, and coordination of care.  MEDICATIONS: Anti-infectives    Start     Dose/Rate Route Frequency Ordered Stop   05/30/16 1100  vancomycin (VANCOCIN) IVPB 1000 mg/200 mL premix     1,000 mg 200 mL/hr over 60 Minutes Intravenous Every 12 hours 05/30/16 0854     05/29/16 1200  Ampicillin-Sulbactam (UNASYN) 3 g in sodium chloride 0.9 % 100 mL IVPB  Status:  Discontinued     3 g 100 mL/hr over 60 Minutes Intravenous Every 8 hours 05/29/16 1042 05/31/16 1035   05/27/16 2000  vancomycin (VANCOCIN) 1,500 mg in sodium chloride 0.9 % 500 mL IVPB  Status:  Discontinued     1,500 mg 250 mL/hr over 120 Minutes Intravenous Every 12 hours 05/27/16 0736 05/30/16 0845   05/27/16 1400  piperacillin-tazobactam (ZOSYN) IVPB 3.375 g  Status:  Discontinued     3.375 g 12.5 mL/hr over 240 Minutes Intravenous Every 8 hours 05/27/16 0753 05/28/16 1042   05/27/16 0830  vancomycin (VANCOCIN)  2,000 mg in sodium chloride 0.9 % 500 mL IVPB     2,000 mg 250 mL/hr over 120 Minutes Intravenous  Once 05/27/16 0736 05/27/16 1123   05/27/16 0745  piperacillin-tazobactam (ZOSYN) IVPB 3.375 g     3.375 g 100 mL/hr over 30 Minutes Intravenous STAT 05/27/16  0736 05/27/16 0839      Scheduled Meds: . aspirin EC  81 mg Oral Daily  . Chlorhexidine Gluconate Cloth  6 each Topical Q0600  . enoxaparin (LOVENOX) injection  40 mg Subcutaneous Q24H  . escitalopram  10 mg Oral Daily  . feeding supplement (GLUCERNA SHAKE)  237 mL Oral TID BM  . folic acid  1 mg Oral Daily  . gabapentin  100 mg Oral BID  . hydrOXYzine  25 mg Oral QID  . insulin aspart  0-20 Units Subcutaneous TID WC  . insulin aspart  0-5 Units Subcutaneous QHS  . insulin aspart  16 Units Subcutaneous TID WC  . insulin glargine  22 Units Subcutaneous Daily  . insulin glargine  60 Units Subcutaneous QHS  . ipratropium-albuterol  3 mL Nebulization TID  . magnesium oxide  400 mg Oral Daily  . metoprolol tartrate  12.5 mg Oral Q12H  . multivitamin with minerals  1 tablet Oral Daily  . mupirocin ointment  1 application Nasal BID  . pantoprazole  40 mg Oral Daily  . potassium chloride  40 mEq Oral Q24H  . predniSONE  40 mg Oral Q breakfast  . rOPINIRole  0.5 mg Oral QHS  . senna-docusate  2 tablet Oral QHS  . silver sulfADIAZINE   Topical Daily  . torsemide  20 mg Oral BID  . traZODone  25 mg Oral QHS  . vancomycin  1,000 mg Intravenous Q12H  . vitamin C  500 mg Oral Q12H   Continuous Infusions:   PRN Meds:.acetaminophen, guaiFENesin-dextromethorphan, magnesium hydroxide, ondansetron (ZOFRAN) IV, traMADol-acetaminophen   PHYSICAL EXAM: Vital signs: Vitals:   05/31/16 0358 05/31/16 0809 05/31/16 0820 05/31/16 0900  BP:  90/72  93/76  Pulse:    92  Resp:  16    Temp: 98.2 F (36.8 C) 97.6 F (36.4 C)    TempSrc: Oral Axillary    SpO2:   95%   Weight: (!) 159.8 kg (352 lb 4.7 oz)      Filed Weights   05/29/16 0401 05/30/16 0417 05/31/16 0358  Weight: (!) 155.9 kg (343 lb 11.2 oz) (!) 157.6 kg (347 lb 7.1 oz) (!) 159.8 kg (352 lb 4.7 oz)   Body mass index is 46.48 kg/m.   General appearance :Awake, alert, not in any distress. Speech Clear. Eyes:, pupils equally  reactive to light and accomodation,no scleral icterus.Pink conjunctiva HEENT: Atraumatic and Normocephalic Neck: supple, no JVD. No cervical lymphadenopathy. No thyromegaly Resp:Good air entry bilaterally, no added sounds  CVS: S1 S2 regular, + syst murmurs.  GI: Bowel sounds present, Non tender and not distended with no gaurding, rigidity or rebound.No organomegaly Extremities: B/L Lower Ext shows no edema, both legs are warm to touch Neurology:  speech clear,Non focal, sensation is grossly intact. Psychiatric: Normal judgment and insight. Alert and oriented x 3. Normal mood. Musculoskeletal:gait appears to be normal.No digital cyanosis  I have personally reviewed following labs and imaging studies  LABORATORY DATA: CBC:  Recent Labs Lab 05/27/16 0633 05/29/16 0300 05/30/16 1008  WBC 29.3* 17.4* 14.0*  HGB 13.6 11.2* 11.5*  HCT 40.1 34.7* 35.5*  MCV 88.9 90.6 91.0  PLT 227 214 222  Basic Metabolic Panel:  Recent Labs Lab 05/29/16 0300 05/29/16 1748 05/29/16 2229 05/30/16 1008 05/30/16 1441 05/31/16 0223  NA 131* 128* 127* 129*  --  129*  K 4.2 4.4 4.7 4.6  --  4.4  CL 97* 97* 97* 98*  --  98*  CO2 25 22 20* 21*  --  24  GLUCOSE 339* 444* 419* 413* 406* 339*  BUN 26* 25* 24* 20  --  19  CREATININE 1.10 1.00 1.06 0.95  --  0.91  CALCIUM 7.8* 7.7* 7.6* 7.9*  --  8.0*    GFR: Estimated Creatinine Clearance: 123 mL/min (by C-G formula based on SCr of 0.91 mg/dL).  Liver Function Tests:  Recent Labs Lab 05/27/16 0720 05/29/16 0300  AST 15 45*  ALT 20 44  ALKPHOS 138* 209*  BILITOT 0.8 0.8  PROT 7.4 5.2*  ALBUMIN 2.8* 1.9*   No results for input(s): LIPASE, AMYLASE in the last 168 hours. No results for input(s): AMMONIA in the last 168 hours.  Coagulation Profile:  Recent Labs Lab 05/27/16 1107  INR 1.22    Cardiac Enzymes:  Recent Labs Lab 05/27/16 1107 05/27/16 1555 05/27/16 2209  TROPONINI 0.11* <0.03 <0.03    BNP (last 3  results) No results for input(s): PROBNP in the last 8760 hours.  HbA1C: No results for input(s): HGBA1C in the last 72 hours.  CBG:  Recent Labs Lab 05/30/16 1252 05/30/16 1458 05/30/16 1748 05/30/16 2218 05/31/16 0812  GLUCAP 435* 393* 386* 322* 362*    Lipid Profile: No results for input(s): CHOL, HDL, LDLCALC, TRIG, CHOLHDL, LDLDIRECT in the last 72 hours.  Thyroid Function Tests: No results for input(s): TSH, T4TOTAL, FREET4, T3FREE, THYROIDAB in the last 72 hours.  Anemia Panel: No results for input(s): VITAMINB12, FOLATE, FERRITIN, TIBC, IRON, RETICCTPCT in the last 72 hours.  Urine analysis:    Component Value Date/Time   COLORURINE YELLOW 05/27/2016 1045   APPEARANCEUR TURBID (A) 05/27/2016 1045   LABSPEC 1.030 05/27/2016 1045   PHURINE 5.5 05/27/2016 1045   GLUCOSEU >1000 (A) 05/27/2016 1045   HGBUR MODERATE (A) 05/27/2016 1045   BILIRUBINUR NEGATIVE 05/27/2016 1045   KETONESUR 15 (A) 05/27/2016 1045   PROTEINUR 30 (A) 05/27/2016 1045   UROBILINOGEN 1.0 07/01/2015 1455   NITRITE POSITIVE (A) 05/27/2016 1045   LEUKOCYTESUR MODERATE (A) 05/27/2016 1045    Sepsis Labs: Lactic Acid, Venous    Component Value Date/Time   LATICACIDVEN 1.2 05/29/2016 0225    MICROBIOLOGY: Recent Results (from the past 240 hour(s))  Blood Culture (routine x 2)     Status: None (Preliminary result)   Collection Time: 05/27/16  7:55 AM  Result Value Ref Range Status   Specimen Description BLOOD RIGHT UPPER ARM  Final   Special Requests IN PEDIATRIC BOTTLE  2CC  Final   Culture NO GROWTH 3 DAYS  Final   Report Status PENDING  Incomplete  Blood Culture (routine x 2)     Status: Abnormal   Collection Time: 05/27/16  8:08 AM  Result Value Ref Range Status   Specimen Description BLOOD RIGHT UPPER ARM  Final   Special Requests BOTTLES DRAWN AEROBIC AND ANAEROBIC  5CC  Final   Culture  Setup Time   Final    IN BOTH AEROBIC AND ANAEROBIC BOTTLES GRAM POSITIVE COCCI IN  CLUSTERS CRITICAL RESULT CALLED TO, READ BACK BY AND VERIFIED WITH: TO GABBOTT(PHARD) BY TCLEVELAND AT 1:59AM    Culture METHICILLIN RESISTANT STAPHYLOCOCCUS AUREUS (A)  Final  Report Status 05/30/2016 FINAL  Final   Organism ID, Bacteria METHICILLIN RESISTANT STAPHYLOCOCCUS AUREUS  Final      Susceptibility   Methicillin resistant staphylococcus aureus - MIC*    CIPROFLOXACIN 1 SENSITIVE Sensitive     ERYTHROMYCIN >=8 RESISTANT Resistant     GENTAMICIN <=0.5 SENSITIVE Sensitive     OXACILLIN >=4 RESISTANT Resistant     TETRACYCLINE >=16 RESISTANT Resistant     VANCOMYCIN 1 SENSITIVE Sensitive     TRIMETH/SULFA <=10 SENSITIVE Sensitive     CLINDAMYCIN <=0.25 RESISTANT Resistant     RIFAMPIN <=0.5 SENSITIVE Sensitive     Inducible Clindamycin POSITIVE Resistant     * METHICILLIN RESISTANT STAPHYLOCOCCUS AUREUS  Blood Culture ID Panel (Reflexed)     Status: Abnormal   Collection Time: 05/27/16  8:08 AM  Result Value Ref Range Status   Enterococcus species NOT DETECTED NOT DETECTED Final   Vancomycin resistance NOT DETECTED NOT DETECTED Final   Listeria monocytogenes NOT DETECTED NOT DETECTED Final   Staphylococcus species DETECTED (A) NOT DETECTED Final    Comment: CRITICAL RESULT CALLED TO, READ BACK BY AND VERIFIED WITH: TO GABBOTT(PHARD) BY TCLEVELAND AT 1:59AM    Staphylococcus aureus DETECTED (A) NOT DETECTED Final    Comment: CRITICAL RESULT CALLED TO, READ BACK BY AND VERIFIED WITH: TO GABBOTT(PHARD) BY TCLEVELAND AT 1:59AM    Methicillin resistance DETECTED (A) NOT DETECTED Final    Comment: CRITICAL RESULT CALLED TO, READ BACK BY AND VERIFIED WITH: TO GABBOTT(PHARD) BY TCLEVELAND AT 2:06    Streptococcus species NOT DETECTED NOT DETECTED Final   Streptococcus agalactiae NOT DETECTED NOT DETECTED Final   Streptococcus pneumoniae NOT DETECTED NOT DETECTED Final   Streptococcus pyogenes NOT DETECTED NOT DETECTED Final   Acinetobacter baumannii NOT DETECTED NOT DETECTED  Final   Enterobacteriaceae species NOT DETECTED NOT DETECTED Final   Enterobacter cloacae complex NOT DETECTED NOT DETECTED Final   Escherichia coli NOT DETECTED NOT DETECTED Final   Klebsiella oxytoca NOT DETECTED NOT DETECTED Final   Klebsiella pneumoniae NOT DETECTED NOT DETECTED Final   Proteus species NOT DETECTED NOT DETECTED Final   Serratia marcescens NOT DETECTED NOT DETECTED Final   Carbapenem resistance NOT DETECTED NOT DETECTED Final   Haemophilus influenzae NOT DETECTED NOT DETECTED Final   Neisseria meningitidis NOT DETECTED NOT DETECTED Final   Pseudomonas aeruginosa NOT DETECTED NOT DETECTED Final   Candida albicans NOT DETECTED NOT DETECTED Final   Candida glabrata NOT DETECTED NOT DETECTED Final   Candida krusei NOT DETECTED NOT DETECTED Final   Candida parapsilosis NOT DETECTED NOT DETECTED Final   Candida tropicalis NOT DETECTED NOT DETECTED Final  Urine culture     Status: Abnormal   Collection Time: 05/27/16 10:45 AM  Result Value Ref Range Status   Specimen Description URINE, CATHETERIZED  Final   Special Requests NONE  Final   Culture MULTIPLE SPECIES PRESENT, SUGGEST RECOLLECTION (A)  Final   Report Status 05/28/2016 FINAL  Final  MRSA PCR Screening     Status: Abnormal   Collection Time: 05/27/16 12:48 PM  Result Value Ref Range Status   MRSA by PCR POSITIVE (A) NEGATIVE Final    Comment:        The GeneXpert MRSA Assay (FDA approved for NASAL specimens only), is one component of a comprehensive MRSA colonization surveillance program. It is not intended to diagnose MRSA infection nor to guide or monitor treatment for MRSA infections. RESULT CALLED TO, READ BACK BY AND VERIFIED WITH: S VIVERITO,RN  AT 1618 05/27/16 BY L BENFIELD   Culture, blood (Routine X 2) w Reflex to ID Panel     Status: None (Preliminary result)   Collection Time: 05/28/16 12:15 PM  Result Value Ref Range Status   Specimen Description BLOOD RIGHT HAND  Final   Special Requests  IN PEDIATRIC BOTTLE 1CC  Final   Culture NO GROWTH 2 DAYS  Final   Report Status PENDING  Incomplete  Culture, expectorated sputum-assessment     Status: None   Collection Time: 05/29/16 11:48 AM  Result Value Ref Range Status   Specimen Description SPUTUM  Final   Special Requests NONE  Final   Sputum evaluation   Final    THIS SPECIMEN IS ACCEPTABLE. RESPIRATORY CULTURE REPORT TO FOLLOW.   Report Status 05/29/2016 FINAL  Final  Urine culture     Status: Abnormal (Preliminary result)   Collection Time: 05/29/16 11:48 AM  Result Value Ref Range Status   Specimen Description URINE, RANDOM  Final   Special Requests NONE  Final   Culture >=100,000 COLONIES/mL STAPHYLOCOCCUS AUREUS (A)  Final   Report Status PENDING  Incomplete  Culture, respiratory (NON-Expectorated)     Status: None (Preliminary result)   Collection Time: 05/29/16 11:48 AM  Result Value Ref Range Status   Specimen Description EXPECTORATED SPUTUM  Final   Special Requests NONE  Final   Gram Stain   Final    ABUNDANT WBC PRESENT, PREDOMINANTLY PMN RARE GRAM POSITIVE COCCI IN PAIRS RARE SQUAMOUS EPITHELIAL CELLS PRESENT    Culture   Final    MODERATE STAPHYLOCOCCUS AUREUS SUSCEPTIBILITIES TO FOLLOW    Report Status PENDING  Incomplete    RADIOLOGY STUDIES/RESULTS: Ct Chest W Contrast  Result Date: 05/28/2016 CLINICAL DATA:  70 year old male inpatient with a reported history of right supraclavicular lymphadenopathy and tenderness. Bacteremia. EXAM: CT CHEST WITH CONTRAST TECHNIQUE: Multidetector CT imaging of the chest was performed during intravenous contrast administration. CONTRAST:  57mL ISOVUE-300 IOPAMIDOL (ISOVUE-300) INJECTION 61% COMPARISON:  Chest radiograph from earlier today. 09/07/2015 chest CT. FINDINGS: Cardiovascular: New mild cardiomegaly. Trace pericardial effusion/ thickening. Left anterior descending, left circumflex and right coronary atherosclerosis. Atherosclerotic nonaneurysmal thoracic aorta.  Normal caliber pulmonary arteries. No central pulmonary emboli. Mediastinum/Nodes: No discrete thyroid nodules. Unremarkable esophagus. No axillary adenopathy. New mild right paratracheal adenopathy measuring up to 1.4 cm (series 3/ image 47). New mild subcarinal adenopathy measuring up to 1.4 cm (series 3/image 60). No additional pathologically enlarged mediastinal or hilar nodes. Lungs/Pleura: No pneumothorax. Small right pleural effusion. No left pleural effusion. There is near complete consolidation of the right lower lobe, with some associated volume loss in the right lower lobe, and focal mild cavitary change in the basilar right lower lobe. Mild compressive atelectasis in the dependent right upper and right middle lobes. Mild hypoventilatory change in the dependent left lower lobe. No significant pulmonary nodules in the aerated portions of the lungs. Upper abdomen: Cholecystectomy. Surgical sutures and clips are noted in the region of the proximal stomach, which causes streak artifact and limits visualization in this region. Musculoskeletal: No aggressive appearing focal osseous lesions. Stable small sclerotic lesion in the T5 vertebral body, probably a benign bone island. Moderate thoracic spondylosis. Moderate symmetric gynecomastia appears stable. IMPRESSION: 1. Chest CT findings are most suggestive of a cavitary right lower lobe pneumonia. 2. Small parapneumonic right pleural effusion with mild compressive atelectasis in the dependent right lung. 3. New mild right paratracheal and subcarinal lymphadenopathy, nonspecific, possibly reactive. 4. A follow-up chest  CT with IV contrast is recommended in 3 months to document resolution of these findings. 5. New mild cardiomegaly.  Trace pericardial effusion/thickening. 6. Aortic atherosclerosis.  Three-vessel coronary atherosclerosis. Electronically Signed   By: Ilona Sorrel M.D.   On: 05/28/2016 17:21   Dg Chest Port 1 View  Result Date:  05/28/2016 CLINICAL DATA:  Sepsis EXAM: PORTABLE CHEST 1 VIEW COMPARISON:  05/27/2016 FINDINGS: Cardiomegaly with vascular congestion. Bibasilar atelectasis, right greater than left. Mild interstitial prominence throughout the lungs could reflect mild interstitial edema. This is increased slightly since prior study. IMPRESSION: Low lung volumes with bibasilar atelectasis. Cardiomegaly with vascular congestion and possible mild interstitial edema. Electronically Signed   By: Rolm Baptise M.D.   On: 05/28/2016 07:41   Dg Chest Portable 1 View  Result Date: 05/27/2016 CLINICAL DATA:  Right side chest pain and SOB x this morning. Hx of HTN, diabetes, CAD, CHF. EXAM: PORTABLE CHEST 1 VIEW COMPARISON:  01/11/2016 FINDINGS: Normal cardiac silhouette. There are low lung volumes with bibasilar atelectasis not changed. No pneumothorax. IMPRESSION: Low lung volumes and bibasilar atelectasis.  No change. Electronically Signed   By: Suzy Bouchard M.D.   On: 05/27/2016 08:37     LOS: 4 days   Oren Binet, MD  [facility] Pager:[phone]  If 7PM-7AM, please contact night-coverage [email or web address] Password [email or web address] 05/31/2016, 11:14 AM

## 2016-05-31 NOTE — Progress Notes (Addendum)
INFECTIOUS DISEASE PROGRESS NOTE  ID: [name] is a 70 y.o. male with  Principal Problem:   Sepsis ([place]) Active Problems:   Diabetes mellitus due to underlying condition, uncontrolled, with hyperosmolarity without coma, with long-term current use of insulin (HCC)   OSA (obstructive sleep apnea)   Chronic indwelling Foley catheter   Chronic diastolic CHF (congestive heart failure) (HCC)   History of DVT (deep vein thrombosis)   Pressure ulcer   Neurogenic bladder   Hyperlipidemia LDL goal <70   Morbid obesity (HCC)   CAD in native artery   H/O: CVA (cerebrovascular accident)   Bullous pemphigus   OSA on CPAP   Chest pain   MRSA bacteremia   Severe sepsis ([place])   Cavitary pneumonia   HCAP (healthcare-associated pneumonia)   Uncontrolled type 2 diabetes mellitus with complication ([place])  Subjective: [name] continues to experience a cough productive of brown sputum and chest pain when he coughs.   Abtx:  Anti-infectives    Start     Dose/Rate Route Frequency Ordered Stop   05/30/16 1100  vancomycin (VANCOCIN) IVPB 1000 mg/200 mL premix     1,000 mg 200 mL/hr over 60 Minutes Intravenous Every 12 hours 05/30/16 0854     05/29/16 1200  Ampicillin-Sulbactam (UNASYN) 3 g in sodium chloride 0.9 % 100 mL IVPB     3 g 100 mL/hr over 60 Minutes Intravenous Every 8 hours 05/29/16 1042     05/27/16 2000  vancomycin (VANCOCIN) 1,500 mg in sodium chloride 0.9 % 500 mL IVPB  Status:  Discontinued     1,500 mg 250 mL/hr over 120 Minutes Intravenous Every 12 hours 05/27/16 0736 05/30/16 0845   05/27/16 1400  piperacillin-tazobactam (ZOSYN) IVPB 3.375 g  Status:  Discontinued     3.375 g 12.5 mL/hr over 240 Minutes Intravenous Every 8 hours 05/27/16 0753 05/28/16 1042   05/27/16 0830  vancomycin (VANCOCIN) 2,000 mg in sodium chloride 0.9 % 500 mL IVPB     2,000 mg 250 mL/hr over 120 Minutes Intravenous  Once 05/27/16 0736 05/27/16 1123   05/27/16 0745  piperacillin-tazobactam  (ZOSYN) IVPB 3.375 g     3.375 g 100 mL/hr over 30 Minutes Intravenous STAT 05/27/16 0736 05/27/16 0839      Medications: I have reviewed the patient's current medications.  Objective: Vital signs in last 24 hours: Temp:  [97.6 F (36.4 C)-99.7 F (37.6 C)] 97.6 F (36.4 C) (09/21 0809) Resp:  [16-30] 16 (09/21 0809) BP: (90-120)/(67-74) 90/72 (09/21 0809) SpO2:  [95 %-96 %] 95 % (09/21 0820) Weight:  [352 lb 4.7 oz (159.8 kg)] 352 lb 4.7 oz (159.8 kg) (09/21 0358)  General appearance: alert, cooperative and no distress Resp: right lower lung field decreased air entry, upper lung field air entry is improving, expiratory wheeze  Caridio: Regular rate and rhythm, no murmur  GI: bowel sounds normal. Soft, non tender, non distended  Extremities: ruptured bullae over bilateral lower extremities. Non erythematous. Increased purulent drainage of these bullae.   Lab Results  Recent Labs  05/29/16 0300  05/30/16 1008 05/31/16 0223  WBC 17.4*  --  14.0*  --   HGB 11.2*  --  11.5*  --   HCT 34.7*  --  35.5*  --   NA 131*  < > 129* 129*  K 4.2  < > 4.6 4.4  CL 97*  < > 98* 98*  CO2 25  < > 21* 24  BUN 26*  < >  20 19  CREATININE 1.10  < > 0.95 0.91  < > = values in this interval not displayed. Liver Panel  Recent Labs  05/29/16 0300  PROT 5.2*  ALBUMIN 1.9*  AST 45*  ALT 44  ALKPHOS 209*  BILITOT 0.8   Microbiology: Recent Results (from the past 240 hour(s))  Blood Culture (routine x 2)     Status: None (Preliminary result)   Collection Time: 05/27/16  7:55 AM  Result Value Ref Range Status   Specimen Description BLOOD RIGHT UPPER ARM  Final   Special Requests IN PEDIATRIC BOTTLE  2CC  Final   Culture NO GROWTH 3 DAYS  Final   Report Status PENDING  Incomplete  Blood Culture (routine x 2)     Status: Abnormal   Collection Time: 05/27/16  8:08 AM  Result Value Ref Range Status   Specimen Description BLOOD RIGHT UPPER ARM  Final   Special Requests BOTTLES DRAWN  AEROBIC AND ANAEROBIC  5CC  Final   Culture  Setup Time   Final    IN BOTH AEROBIC AND ANAEROBIC BOTTLES GRAM POSITIVE COCCI IN CLUSTERS CRITICAL RESULT CALLED TO, READ BACK BY AND VERIFIED WITH: TO GABBOTT(PHARD) BY TCLEVELAND AT 1:59AM    Culture METHICILLIN RESISTANT STAPHYLOCOCCUS AUREUS (A)  Final   Report Status 05/30/2016 FINAL  Final   Organism ID, Bacteria METHICILLIN RESISTANT STAPHYLOCOCCUS AUREUS  Final      Susceptibility   Methicillin resistant staphylococcus aureus - MIC*    CIPROFLOXACIN 1 SENSITIVE Sensitive     ERYTHROMYCIN >=8 RESISTANT Resistant     GENTAMICIN <=0.5 SENSITIVE Sensitive     OXACILLIN >=4 RESISTANT Resistant     TETRACYCLINE >=16 RESISTANT Resistant     VANCOMYCIN 1 SENSITIVE Sensitive     TRIMETH/SULFA <=10 SENSITIVE Sensitive     CLINDAMYCIN <=0.25 RESISTANT Resistant     RIFAMPIN <=0.5 SENSITIVE Sensitive     Inducible Clindamycin POSITIVE Resistant     * METHICILLIN RESISTANT STAPHYLOCOCCUS AUREUS  Blood Culture ID Panel (Reflexed)     Status: Abnormal   Collection Time: 05/27/16  8:08 AM  Result Value Ref Range Status   Enterococcus species NOT DETECTED NOT DETECTED Final   Vancomycin resistance NOT DETECTED NOT DETECTED Final   Listeria monocytogenes NOT DETECTED NOT DETECTED Final   Staphylococcus species DETECTED (A) NOT DETECTED Final    Comment: CRITICAL RESULT CALLED TO, READ BACK BY AND VERIFIED WITH: TO GABBOTT(PHARD) BY TCLEVELAND AT 1:59AM    Staphylococcus aureus DETECTED (A) NOT DETECTED Final    Comment: CRITICAL RESULT CALLED TO, READ BACK BY AND VERIFIED WITH: TO GABBOTT(PHARD) BY TCLEVELAND AT 1:59AM    Methicillin resistance DETECTED (A) NOT DETECTED Final    Comment: CRITICAL RESULT CALLED TO, READ BACK BY AND VERIFIED WITH: TO GABBOTT(PHARD) BY TCLEVELAND AT 2:06    Streptococcus species NOT DETECTED NOT DETECTED Final   Streptococcus agalactiae NOT DETECTED NOT DETECTED Final   Streptococcus pneumoniae NOT DETECTED  NOT DETECTED Final   Streptococcus pyogenes NOT DETECTED NOT DETECTED Final   Acinetobacter baumannii NOT DETECTED NOT DETECTED Final   Enterobacteriaceae species NOT DETECTED NOT DETECTED Final   Enterobacter cloacae complex NOT DETECTED NOT DETECTED Final   Escherichia coli NOT DETECTED NOT DETECTED Final   Klebsiella oxytoca NOT DETECTED NOT DETECTED Final   Klebsiella pneumoniae NOT DETECTED NOT DETECTED Final   Proteus species NOT DETECTED NOT DETECTED Final   Serratia marcescens NOT DETECTED NOT DETECTED Final   Carbapenem resistance NOT  DETECTED NOT DETECTED Final   Haemophilus influenzae NOT DETECTED NOT DETECTED Final   Neisseria meningitidis NOT DETECTED NOT DETECTED Final   Pseudomonas aeruginosa NOT DETECTED NOT DETECTED Final   Candida albicans NOT DETECTED NOT DETECTED Final   Candida glabrata NOT DETECTED NOT DETECTED Final   Candida krusei NOT DETECTED NOT DETECTED Final   Candida parapsilosis NOT DETECTED NOT DETECTED Final   Candida tropicalis NOT DETECTED NOT DETECTED Final  Urine culture     Status: Abnormal   Collection Time: 05/27/16 10:45 AM  Result Value Ref Range Status   Specimen Description URINE, CATHETERIZED  Final   Special Requests NONE  Final   Culture MULTIPLE SPECIES PRESENT, SUGGEST RECOLLECTION (A)  Final   Report Status 05/28/2016 FINAL  Final  MRSA PCR Screening     Status: Abnormal   Collection Time: 05/27/16 12:48 PM  Result Value Ref Range Status   MRSA by PCR POSITIVE (A) NEGATIVE Final    Comment:        The GeneXpert MRSA Assay (FDA approved for NASAL specimens only), is one component of a comprehensive MRSA colonization surveillance program. It is not intended to diagnose MRSA infection nor to guide or monitor treatment for MRSA infections. RESULT CALLED TO, READ BACK BY AND VERIFIED WITH: S VIVERITO,RN AT 1618 05/27/16 BY L BENFIELD   Culture, blood (Routine X 2) w Reflex to ID Panel     Status: None (Preliminary result)    Collection Time: 05/28/16 12:15 PM  Result Value Ref Range Status   Specimen Description BLOOD RIGHT HAND  Final   Special Requests IN PEDIATRIC BOTTLE 1CC  Final   Culture NO GROWTH 2 DAYS  Final   Report Status PENDING  Incomplete  Culture, expectorated sputum-assessment     Status: None   Collection Time: 05/29/16 11:48 AM  Result Value Ref Range Status   Specimen Description SPUTUM  Final   Special Requests NONE  Final   Sputum evaluation   Final    THIS SPECIMEN IS ACCEPTABLE. RESPIRATORY CULTURE REPORT TO FOLLOW.   Report Status 05/29/2016 FINAL  Final  Urine culture     Status: Abnormal (Preliminary result)   Collection Time: 05/29/16 11:48 AM  Result Value Ref Range Status   Specimen Description URINE, RANDOM  Final   Special Requests NONE  Final   Culture >=100,000 COLONIES/mL STAPHYLOCOCCUS AUREUS (A)  Final   Report Status PENDING  Incomplete  Culture, respiratory (NON-Expectorated)     Status: None (Preliminary result)   Collection Time: 05/29/16 11:48 AM  Result Value Ref Range Status   Specimen Description EXPECTORATED SPUTUM  Final   Special Requests NONE  Final   Gram Stain   Final    ABUNDANT WBC PRESENT, PREDOMINANTLY PMN RARE GRAM POSITIVE COCCI IN PAIRS RARE SQUAMOUS EPITHELIAL CELLS PRESENT    Culture MODERATE STAPHYLOCOCCUS AUREUS  Final   Report Status PENDING  Incomplete    Studies/Results: No results found.   Assessment/Plan: MRSA bacteremia Cellulitis R supraclavicular fossa LN Bullus phemigoid Uncontrolled DM2 Urinary tract infection   -continue vancomycin for a 3 week course to treat MRSA bacteremia, UTI, and PNA  -discontinue unasyn, this was covering for aspiration however his sputum culture was staph aureus  -He would benefit from wound care  -He would benefit from control of his diabetes   Total days of antibiotics: 4 vanco, 2 unasyn          Ledell Noss Infectious Diseases (pager) [phone] [email or web address] 05/31/2016, 9:32  AM  LOS: 4 days   Addendum Appreciate, reviewed confirmed Dr Fredrik Cove note.  Pt seen with Dr Hetty Ely.  Will stop unasyn, plan for 3 weeks of vanco.  Available as needed.

## 2016-05-31 NOTE — Plan of Care (Signed)
Problem: Skin Integrity: Goal: Risk for impaired skin integrity will decrease Outcome: Progressing Cleansed patient's legs and open wounds thoroughly, removed old dead skin. Applied silvadene cream as necessary. Patient's legs improving from admission.   Problem: Nutrition: Goal: Adequate nutrition will be maintained Outcome: Progressing Patient did not eat much at breakfast and lunch due to lack of dentures. Patient family brought dentures from home and patient able to eat more of his dinner. Also patient had glucerna x 2 on my shift.   Problem: Bowel/Gastric: Goal: Will not experience complications related to bowel motility Outcome: Not Progressing Patient has not had a bowel movement since 9/18. Continuing to monitor for constipation.

## 2016-05-31 NOTE — Progress Notes (Addendum)
Inpatient Diabetes Program Recommendations  AACE/ADA: New Consensus Statement on Inpatient Glycemic Control (2015)  Target Ranges:  Prepandial:   less than 140 mg/dL      Peak postprandial:   less than 180 mg/dL (1-2 hours)      Critically ill patients:  140 - 180 mg/dL   Lab Results  Component Value Date   GLUCAP 322 (H) 05/30/2016   HGBA1C 11.7 (H) 05/27/2016   Results for KISHON, GARRIGA (MRN [id number]) as of 05/31/2016 07:24  Ref. Range 05/30/2016 09:15 05/30/2016 12:52 05/30/2016 14:58 05/30/2016 17:48 05/30/2016 22:18  Glucose-Capillary Latest Ref Range: 65 - 99 mg/dL 315 (H) 435 (H) 393 (H) 386 (H) 322 (H)    Diabetes history: Type 2 diabetes  Outpatient Diabetes medications: Soliqua 100-33- 30 units q HS (Patient had also been on NPH 40 units bid however according to medication reconciliation he was not taking)  Current orders for Inpatient glycemic control:  Lantus 52 units q HS, Novolog12 units tid with meals, Novolog resistant tid with meals and HS, Lantus 12 units qday  Inpatient Diabetes Program Recommendations: Blood sugars remain close to 400mg /dl, consider increasing am Lantus to  22 units qam and increase mealtime insulin to 15 units tid with meals.  Note;The mealtime Novolog insulin is being held at times because he's not taking in >50%.  Gentry Fitz, RN, BA, MHA, CDE Diabetes Coordinator Inpatient Diabetes Program  [phone] (Team Pager) [phone] ([place]) 05/31/2016 7:44 AM

## 2016-06-01 ENCOUNTER — Inpatient Hospital Stay (HOSPITAL_COMMUNITY): Payer: Medicare Other

## 2016-06-01 LAB — BASIC METABOLIC PANEL
Anion gap: 8 (ref 5–15)
BUN: 21 mg/dL — AB (ref 6–20)
CHLORIDE: 93 mmol/L — AB (ref 101–111)
CO2: 28 mmol/L (ref 22–32)
CREATININE: 0.96 mg/dL (ref 0.61–1.24)
Calcium: 8.4 mg/dL — ABNORMAL LOW (ref 8.9–10.3)
GFR calc Af Amer: >60 mL/min (ref >60)
GFR calc non Af Amer: >60 mL/min (ref >60)
GLUCOSE: 258 mg/dL — AB (ref 65–99)
POTASSIUM: 4.9 mmol/L (ref 3.5–5.1)
SODIUM: 129 mmol/L — AB (ref 135–145)

## 2016-06-01 LAB — URINE CULTURE

## 2016-06-01 LAB — CULTURE, RESPIRATORY

## 2016-06-01 LAB — GLUCOSE, CAPILLARY
GLUCOSE-CAPILLARY: 224 mg/dL — AB (ref 65–99)
GLUCOSE-CAPILLARY: 232 mg/dL — AB (ref 65–99)
Glucose-Capillary: 319 mg/dL — ABNORMAL HIGH (ref 65–99)
Glucose-Capillary: 272 mg/dL — ABNORMAL HIGH (ref 65–99)
Glucose-Capillary: 243 mg/dL — ABNORMAL HIGH (ref 65–99)

## 2016-06-01 LAB — CULTURE, RESPIRATORY W GRAM STAIN

## 2016-06-01 LAB — CULTURE, BLOOD (ROUTINE X 2): Culture: NO GROWTH

## 2016-06-01 LAB — TROPONIN I: Troponin I: <0.03 ng/mL (ref <0.03)

## 2016-06-01 LAB — VANCOMYCIN, TROUGH: VANCOMYCIN TR: 21 ug/mL — AB (ref 15–20)

## 2016-06-01 MED ORDER — TRAMADOL HCL 50 MG PO TABS
25.0000 mg | ORAL_TABLET | Freq: Once | ORAL | Status: AC
  Administered 2016-06-01: 25 mg via ORAL
  Filled 2016-06-01: qty 1

## 2016-06-01 MED ORDER — MORPHINE SULFATE (PF) 2 MG/ML IV SOLN: 1.0000 mg | Freq: Once | INTRAVENOUS | Status: DC

## 2016-06-01 NOTE — Progress Notes (Signed)
Pt signed consent for PICC.  At present c/o chest pain and SOB.  States not feeling well.  PICC to be done later when Patient feels better.  Floor RN aware

## 2016-06-01 NOTE — Plan of Care (Signed)
Problem: Nutrition: Goal: Adequate nutrition will be maintained Outcome: Progressing Diabetic diet education given.

## 2016-06-01 NOTE — Progress Notes (Signed)
Pharmacy Antibiotic Note  [name] is a 70 y.o. male admitted on 05/27/2016 with R-sided chest pain and found to have MRSA bacteremia & aspiration pna/HCAP.  Pharmacy has been consulted for Vancomycin dosing.  Vanc dose was adjusted 9/20 and steady state trough drawn this AM, which remains a bit above goal.  Pt had received ~3/4 dose at time resulted.  Cr has been stable.  Plan is to continue Vanc through early October.  CBGs continue to be uncontrolled, MD is aggressively adjusting insulin regimen.  Plan: Change Vancomycin to 750mg  IV q12 Watch renal function F/U CBGs  Weight: (!) 350 lb 8.5 oz (159 kg)  Temp (24hrs), Avg:97.8 F (36.6 C), Min:97.4 F (36.3 C), Max:98.4 F (36.9 C)   Recent Labs Lab 05/27/16 0633 05/27/16 0750 05/27/16 1252 05/27/16 1555 05/29/16 0225 05/29/16 0300 05/29/16 1748 05/29/16 2229 05/30/16 0751 05/30/16 1008 05/31/16 0223 06/01/16 0950  WBC 29.3*  --   --   --   --  17.4*  --   --   --  14.0*  --   --   CREATININE 1.12  --   --   --   --  1.10 1.00 1.06  --  0.95 0.91  --   LATICACIDVEN  --  2.44* 2.7* 1.9 1.2  --   --   --   --   --   --   --   VANCOTROUGH  --   --   --   --   --   --   --   --  23*  --   --  21*    Estimated Creatinine Clearance: 122.5 mL/min (by C-G formula based on SCr of 0.91 mg/dL).    Allergies  Allergen Reactions  . Ace Inhibitors Swelling    Pt tolerates lisinopril  . Lipitor [Atorvastatin Calcium] Swelling  . Metformin And Related Swelling  . Cefadroxil Hives  . Cephalexin Hives  . Morphine And Related Other (See Comments)    Sweating, feels like is "in rocky boat."  . Robaxin [Methocarbamol] Other (See Comments)    Feels like he is shaky    Antimicrobials this admission:  Vanc 9/17 >> (10/8) Zosyn 9/17 >> 9/18 Unasyn 9/19 >> 9/21  Dose adjustments this admission:  9/20 VT = 23 mcg/mL on 1500mg  q12 >> 1000mg  q12 (SCr 1.06) 9/22 VT = 21 on 1000mg  IV q12 >> 750mg  IV q12  Microbiology  results:  9/17 MRSA PCR - positive 9/17 BCx x2 - MRSA (BCID MRSA) 9/17 UCx - negative 9/18 BCx x2 - NGTD 9/19 sputum cx -   Thank you for allowing pharmacy to be a part of this patient's care.   Gracy Bruins, PharmD Clinical Pharmacist [facility]

## 2016-06-01 NOTE — Progress Notes (Addendum)
Pt is refusing to draw his scheduled second troponin, first troponin 0.03 normal. Pt stated " I will wait until I get my PICC line in the morning". Nurse educated pt importance of troponin to be drawn on timely manner to follow up on  chest pain.  Pt still declined. NP Jonette Eva made aware. Pt's vitals stable, resting comfortably, no chest pain, no SOB, sats 100% on RA.

## 2016-06-01 NOTE — Progress Notes (Signed)
06/01/2016 Patient at 1110 c/o chest pain in right chest. Patient was given 2 tramadol at 1131 and a 12 lead EKG was done. Result of EKG was Sinus Rhythm with first degree AV block and Vent rate 100 bpm. Dr Sloan Leiter was made aware and trop, cmet and chest x-ray was order. [facility].

## 2016-06-01 NOTE — Evaluation (Signed)
Occupational Therapy Evaluation Patient Details Name: [name] MRN: [id number] DOB: 24-May-1946 Today's Date: 06/01/2016    History of Present Illness Patient is a 70 y.o. male SNF resident, chronic Foley catheter in place, morbid obesity, sleep apnea on CPAP, history of CAD on medical treatment who presented with sepsis secondary to MRSA bacteremia and healthcare associated pneumonia.   Clinical Impression   Patient evaluated by Occupational Therapy with no further acute OT needs identified. All education has been completed and the patient has no further questions. Pt from SNF with plan to return to SNF (long term resident).   He reports he exercises with 5lb weights.  Updated his HEP.  He complains bil. Shoulder pain (indicates ? H/o rotator cuff injury).    See below for any follow-up Occupational Therapy or equipment needs. OT is signing off. Thank you for this referral.      Follow Up Recommendations  No OT follow up    Equipment Recommendations  None recommended by OT    Recommendations for Other Services       Precautions / Restrictions Precautions Precautions: Fall      Mobility Bed Mobility   Bed Mobility: Rolling Rolling: Max assist            Transfers                      Balance                                            ADL Overall ADL's : At baseline                                       General ADL Comments: Pt reports he fed himself, but it takes him a tremendously long time due to swallowing difficulties.  He denies difficulty with self feeding, despite PT correspondence that pt was having difficulty      Vision     Perception     Praxis      Pertinent Vitals/Pain Pain Assessment: Faces Pain Score: 7  Pain Location: Rt chest  Pain Descriptors / Indicators: Aching;Heaviness;Grimacing;Guarding Pain Intervention(s): Monitored during session;Other (comment) (RN notified )     Hand  Dominance Right   Extremity/Trunk Assessment Upper Extremity Assessment Upper Extremity Assessment: RUE deficits/detail;LUE deficits/detail RUE Deficits / Details: Bil. shoulders grossly 4/5.  He complains of bil. shoulder pain with overhead movements.  He reports that past therapist at [facility] felt he had rotator cuff injury.  When asked pt for further clarification, but would not/could not provide info.  He would not provide specific info on location of pain.   Elbows distally grossly 4/5   LUE Deficits / Details: Bil. shoulders grossly 4/5.  He complains of bil. shoulder pain with overhead movements.  He reports that past therapist at [facility] felt he had rotator cuff injury.  When asked pt for further clarification, but would not/could not provide info.  He would not provide specific info on location of pain.   Elbows distally grossly 4/5    Lower Extremity Assessment Lower Extremity Assessment: Defer to PT evaluation       Communication Communication Communication: No difficulties   Cognition Arousal/Alertness: Awake/alert Behavior During Therapy: WFL for tasks assessed/performed Overall Cognitive Status: Within  Functional Limits for tasks assessed                     General Comments       Exercises   Other Exercises Other Exercises: Pt reports he uses 5lb free weights for UB strengthening at the facility.  He reports he has 1, 3 and 5lb weights.  He does not like theraband.  He is able to demonstrates elbow flex and extension exercises.  Instructed him in shoulder flexion, however, he reports pain with attempts.  Instructed him only to raise UEs to shoulder level, but he continues to complain of pain (indicates ? h/o rotator cuff injury).  Instructed pt to perform scapular adduction with 5 second hold.  He was able to return demonstration    Shoulder Instructions      Home Living Family/patient expects to be discharged to:: Skilled nursing facility                                  Additional Comments: Pt reports he now longer receives therapy       Prior Functioning/Environment Level of Independence: Needs assistance  Gait / Transfers Assistance Needed: non ambulatory due to passing out; reports doesn't get up to sit much either.  Pt reports they use lift to get pt OOB.  ADL's / Homemaking Assistance Needed: assist at facility   Comments: Pt reports he feeds himself and does upper body bathing and dressing.         OT Problem List: Decreased strength;Decreased activity tolerance;Pain;Obesity   OT Treatment/Interventions:      OT Goals(Current goals can be found in the care plan section) Acute Rehab OT Goals Patient Stated Goal: did not state  OT Goal Formulation: All assessment and education complete, DC therapy  OT Frequency:     Barriers to D/C:            Co-evaluation              End of Session Nurse Communication: Other (comment) (Rt chest pain )  Activity Tolerance: Patient limited by fatigue Patient left: in bed;with call bell/phone within reach   Time: 1055-1108 OT Time Calculation (min): 13 min Charges:  OT General Charges $OT Visit: 1 Procedure OT Evaluation $OT Eval Low Complexity: 1 Procedure G-Codes:    Hazael Olveda M 06/25/2016, 11:24 AM

## 2016-06-01 NOTE — Progress Notes (Addendum)
PROGRESS NOTE        PATIENT DETAILS Name: [name] Age: 70 y.o. Sex: male Date of Birth: [birth year] Admit Date: 05/27/2016 Admitting Physician Brian Bossier, MD PZW:CHENIDP,OEUM Brian John, MD  Brief Narrative: Patient is a 70 y.o. male SNF resident, chronic Foley catheter in place, morbid obesity, sleep apnea on CPAP, history of CAD on medical treatment who presented with sepsis secondary to MRSA bacteremia and healthcare associated pneumonia.  Subjective: Complains of a right-sided chest pain that is intermittent this morning. Sugars are much better. Lying comfortably in bed.  Assessment/Plan: Principal Problem: Sepsis secondary to MRSA bacteremia and healthcare associated pneumonia: Continue empiric vancomycin, Unasyn discontinued on 9/21. Sputum cultures also positive for Staphylococcus. Blood and sputum cultures positive for MRSA, repeat blood cultures on 9/18 negative so far. ID recommending a total of 3 weeks of intravenous vancomycin from 9/18. Awaiting PICC line placement. Transthoracic echo negative for vegetation, ID not recommending TEE.  Active Problems: Diabetes mellitus due to underlying condition: Uncontrolled initially as on steroids, but with further adjustments better control. Continue Lantus 70 units at bedtime, 30 units in the morning along with 20 units of NovoLog with meals. Follow and adjust accordingly.  A1c 11.7   Hyponatremia: Appears to be chronic as well, probably at her glycemia playing a role as well. Continue Demadex, await repeat chemistry panel today.   CAD:-cardiac catheterization in 2016 noted residual disease without any areas amenable to PCI therefore focus was on medical therapy. Continue aspirin.  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 On prophylactic Lovenox  Moisture associated skin damage B Buttocks:wound care as per [place] RN  Morbid obesity /OSA on CPAP:Bedbound and becomes orthostatic with attempts to mobilize - cont nocturnal CPAP  Chronic diastolic CHF / PNT:IRWERX daily weights and strict Is/Os. Continue Demadex  Moderate aortic stenosis: Stable for outpatient follow-up  Hyperlipidemia:Not on statin prior to admission  Hx CVA   Neurogenic bladder: Chronic Foley catheter in place   DVT Prophylaxis: Prophylactic Lovenox   Code Status: Full code   Family Communication: None at bedside  Disposition Plan: Remain inpatient- SNF on discharge-likely on 9/23  Antimicrobial agents: See below  Procedures: Echo 5/40>> normal systolic function, mild to moderate aortic stenosis.  CONSULTS:  ID  Time spent: 25- minutes-Greater than 50% of this time was spent in counseling, explanation of diagnosis, planning of further management, and coordination of care.  MEDICATIONS: Anti-infectives    Start     Dose/Rate Route Frequency Ordered Stop   05/30/16 1100  vancomycin (VANCOCIN) IVPB 1000 mg/200 mL premix     1,000 mg 200 mL/hr over 60 Minutes Intravenous Every 12 hours 05/30/16 0854     05/29/16 1200  Ampicillin-Sulbactam (UNASYN) 3 g in sodium chloride 0.9 % 100 mL IVPB  Status:  Discontinued     3 g 100 mL/hr over 60 Minutes Intravenous Every 8 hours 05/29/16 1042 05/31/16 1035   05/27/16 2000  vancomycin (VANCOCIN) 1,500 mg in sodium chloride 0.9 % 500 mL IVPB  Status:  Discontinued     1,500 mg 250 mL/hr over 120 Minutes Intravenous Every 12 hours 05/27/16 0736 05/30/16 0845   05/27/16 1400  piperacillin-tazobactam (ZOSYN) IVPB 3.375 g  Status:  Discontinued     3.375 g 12.5 mL/hr over 240 Minutes Intravenous Every 8 hours 05/27/16 0753 05/28/16 1042   05/27/16 0830  vancomycin (VANCOCIN) 2,000 mg in sodium chloride 0.9 % 500 mL IVPB     2,000 mg 250 mL/hr over 120 Minutes  Intravenous  Once 05/27/16 0736 05/27/16 1123   05/27/16 0745  piperacillin-tazobactam (ZOSYN) IVPB 3.375 g     3.375 g 100 mL/hr over 30 Minutes Intravenous STAT 05/27/16 0736 05/27/16 0839      Scheduled Meds: . aspirin EC  81 mg Oral Daily  . enoxaparin (LOVENOX) injection  40 mg Subcutaneous Q24H  . escitalopram  10 mg Oral Daily  . feeding supplement (GLUCERNA SHAKE)  237 mL Oral TID BM  . folic acid  1 mg Oral Daily  . gabapentin  100 mg Oral BID  . hydrOXYzine  25 mg Oral QID  . insulin aspart  0-10 Units Subcutaneous QHS  . insulin aspart  0-20 Units Subcutaneous TID WC  . insulin aspart  20 Units Subcutaneous TID WC  . insulin glargine  30 Units Subcutaneous Daily  . insulin glargine  70 Units Subcutaneous QHS  . magnesium oxide  400 mg Oral Daily  . metoprolol tartrate  12.5 mg Oral Q12H  . multivitamin with minerals  1 tablet Oral Daily  . pantoprazole  40 mg Oral Daily  . potassium chloride SA  40 mEq Oral Daily  . predniSONE  40 mg Oral Q breakfast  . rOPINIRole  0.5 mg Oral QHS  . senna-docusate  2 tablet Oral QHS  . silver sulfADIAZINE   Topical Daily  . torsemide  20 mg Oral BID  . traZODone  25 mg Oral QHS  . vancomycin  1,000 mg Intravenous Q12H  . vitamin C  500 mg Oral Q12H   Continuous Infusions:   PRN Meds:.acetaminophen, guaiFENesin-dextromethorphan, ipratropium-albuterol, magnesium hydroxide, ondansetron (ZOFRAN) IV, traMADol-acetaminophen   PHYSICAL EXAM: Vital signs: Vitals:   05/31/16 2340 06/01/16 0445 06/01/16 0500 06/01/16 0806  BP: (!) 93/57 107/64  (!) 109/51  Pulse: 90 78  80  Resp: (!) 21 18  19   Temp: 97.9 F (36.6 C) 97.4 F (36.3 C)  97.8 F (36.6 C)  TempSrc: Oral Oral  Axillary  SpO2: 100% 94%  96%  Weight:   (!) 159 kg (350 lb 8.5 oz)    Filed Weights   05/30/16 0417 05/31/16 0358 06/01/16 0500  Weight: (!) 157.6 kg (347 lb 7.1 oz) (!) 159.8 kg (352 lb 4.7 oz) (!) 159 kg (350 lb 8.5 oz)   Body mass index is 46.25 kg/m.    General appearance :Awake, alert, not in any distress. Speech Clear. Eyes:, pupils equally reactive to light and accomodation,no scleral icterus.Pink conjunctiva HEENT: Atraumatic and Normocephalic Neck: supple, no JVD. No cervical lymphadenopathy. No thyromegaly Resp:Good air entry bilaterally, no added sounds  CVS: S1 S2 regular, + syst murmurs.  GI: Bowel sounds present, Non tender and not distended with no gaurding, rigidity or rebound.No organomegaly Extremities: B/L Lower Ext shows no edema, both legs are warm to touch Neurology:  speech clear,Non focal, sensation is grossly intact. Psychiatric: Normal judgment and insight. Alert and oriented x 3. Normal mood. Musculoskeletal:gait appears to be normal.No digital cyanosis  I have personally reviewed following labs and imaging studies  LABORATORY DATA: CBC:  Recent Labs Lab 05/27/16 0633 05/29/16 0300 05/30/16 1008  WBC 29.3* 17.4* 14.0*  HGB 13.6 11.2* 11.5*  HCT 40.1 34.7* 35.5*  MCV 88.9 90.6 91.0  PLT 227 214 893    Basic Metabolic Panel:  Recent Labs Lab 05/29/16 0300 05/29/16 1748 05/29/16 2229 05/30/16 1008 05/30/16 1441 05/31/16 0223  NA 131* 128* 127* 129*  --  129*  K 4.2 4.4 4.7 4.6  --  4.4  CL 97* 97* 97* 98*  --  98*  CO2 25 22 20* 21*  --  24  GLUCOSE 339* 444* 419* 413* 406* 339*  BUN 26* 25* 24* 20  --  19  CREATININE 1.10 1.00 1.06 0.95  --  0.91  CALCIUM 7.8* 7.7* 7.6* 7.9*  --  8.0*    GFR: Estimated Creatinine Clearance: 122.5 mL/min (by C-G formula based on SCr of 0.91 mg/dL).  Liver Function Tests:  Recent Labs Lab 05/27/16 0720 05/29/16 0300  AST 15 45*  ALT 20 44  ALKPHOS 138* 209*  BILITOT 0.8 0.8  PROT 7.4 5.2*  ALBUMIN 2.8* 1.9*   No results for input(s): LIPASE, AMYLASE in the last 168 hours. No results for input(s): AMMONIA in the last 168 hours.  Coagulation Profile:  Recent Labs Lab 05/27/16 1107  INR 1.22    Cardiac Enzymes:  Recent Labs Lab  05/27/16 1107 05/27/16 1555 05/27/16 2209  TROPONINI 0.11* <0.03 <0.03    BNP (last 3 results) No results for input(s): PROBNP in the last 8760 hours.  HbA1C: No results for input(s): HGBA1C in the last 72 hours.  CBG:  Recent Labs Lab 05/31/16 1235 05/31/16 1713 05/31/16 2242 06/01/16 0441 06/01/16 0805  GLUCAP 465* 447* 416* 224* 232*    Lipid Profile: No results for input(s): CHOL, HDL, LDLCALC, TRIG, CHOLHDL, LDLDIRECT in the last 72 hours.  Thyroid Function Tests: No results for input(s): TSH, T4TOTAL, FREET4, T3FREE, THYROIDAB in the last 72 hours.  Anemia Panel: No results for input(s): VITAMINB12, FOLATE, FERRITIN, TIBC, IRON, RETICCTPCT in the last 72 hours.  Urine analysis:    Component Value Date/Time   COLORURINE YELLOW 05/27/2016 1045   APPEARANCEUR TURBID (A) 05/27/2016 1045   LABSPEC 1.030 05/27/2016 1045   PHURINE 5.5 05/27/2016 1045   GLUCOSEU >1000 (A) 05/27/2016 1045   HGBUR MODERATE (A) 05/27/2016 1045   BILIRUBINUR NEGATIVE 05/27/2016 1045   KETONESUR 15 (A) 05/27/2016 1045   PROTEINUR 30 (A) 05/27/2016 1045   UROBILINOGEN 1.0 07/01/2015 1455   NITRITE POSITIVE (A) 05/27/2016 1045   LEUKOCYTESUR MODERATE (A) 05/27/2016 1045    Sepsis Labs: Lactic Acid, Venous    Component Value Date/Time   LATICACIDVEN 1.2 05/29/2016 0225    MICROBIOLOGY: Recent Results (from the past 240 hour(s))  Blood Culture (routine x 2)     Status: None (Preliminary result)   Collection Time: 05/27/16  7:55 AM  Result Value Ref Range Status   Specimen Description BLOOD RIGHT UPPER ARM  Final   Special Requests IN PEDIATRIC BOTTLE  2CC  Final   Culture NO GROWTH 4 DAYS  Final   Report Status PENDING  Incomplete  Blood Culture (routine x 2)     Status: Abnormal   Collection Time: 05/27/16  8:08 AM  Result Value Ref Range Status   Specimen Description BLOOD RIGHT UPPER ARM  Final   Special Requests BOTTLES DRAWN AEROBIC AND ANAEROBIC  5CC  Final   Culture   Setup Time   Final    IN BOTH AEROBIC AND ANAEROBIC BOTTLES GRAM POSITIVE COCCI IN CLUSTERS CRITICAL RESULT CALLED TO, READ BACK BY AND VERIFIED WITH: TO GABBOTT(PHARD) BY TCLEVELAND AT 1:59AM  Culture METHICILLIN RESISTANT STAPHYLOCOCCUS AUREUS (A)  Final   Report Status 05/30/2016 FINAL  Final   Organism ID, Bacteria METHICILLIN RESISTANT STAPHYLOCOCCUS AUREUS  Final      Susceptibility   Methicillin resistant staphylococcus aureus - MIC*    CIPROFLOXACIN 1 SENSITIVE Sensitive     ERYTHROMYCIN >=8 RESISTANT Resistant     GENTAMICIN <=0.5 SENSITIVE Sensitive     OXACILLIN >=4 RESISTANT Resistant     TETRACYCLINE >=16 RESISTANT Resistant     VANCOMYCIN 1 SENSITIVE Sensitive     TRIMETH/SULFA <=10 SENSITIVE Sensitive     CLINDAMYCIN <=0.25 RESISTANT Resistant     RIFAMPIN <=0.5 SENSITIVE Sensitive     Inducible Clindamycin POSITIVE Resistant     * METHICILLIN RESISTANT STAPHYLOCOCCUS AUREUS  Blood Culture ID Panel (Reflexed)     Status: Abnormal   Collection Time: 05/27/16  8:08 AM  Result Value Ref Range Status   Enterococcus species NOT DETECTED NOT DETECTED Final   Vancomycin resistance NOT DETECTED NOT DETECTED Final   Listeria monocytogenes NOT DETECTED NOT DETECTED Final   Staphylococcus species DETECTED (A) NOT DETECTED Final    Comment: CRITICAL RESULT CALLED TO, READ BACK BY AND VERIFIED WITH: TO GABBOTT(PHARD) BY TCLEVELAND AT 1:59AM    Staphylococcus aureus DETECTED (A) NOT DETECTED Final    Comment: CRITICAL RESULT CALLED TO, READ BACK BY AND VERIFIED WITH: TO GABBOTT(PHARD) BY TCLEVELAND AT 1:59AM    Methicillin resistance DETECTED (A) NOT DETECTED Final    Comment: CRITICAL RESULT CALLED TO, READ BACK BY AND VERIFIED WITH: TO GABBOTT(PHARD) BY TCLEVELAND AT 2:06    Streptococcus species NOT DETECTED NOT DETECTED Final   Streptococcus agalactiae NOT DETECTED NOT DETECTED Final   Streptococcus pneumoniae NOT DETECTED NOT DETECTED Final   Streptococcus pyogenes  NOT DETECTED NOT DETECTED Final   Acinetobacter baumannii NOT DETECTED NOT DETECTED Final   Enterobacteriaceae species NOT DETECTED NOT DETECTED Final   Enterobacter cloacae complex NOT DETECTED NOT DETECTED Final   Escherichia coli NOT DETECTED NOT DETECTED Final   Klebsiella oxytoca NOT DETECTED NOT DETECTED Final   Klebsiella pneumoniae NOT DETECTED NOT DETECTED Final   Proteus species NOT DETECTED NOT DETECTED Final   Serratia marcescens NOT DETECTED NOT DETECTED Final   Carbapenem resistance NOT DETECTED NOT DETECTED Final   Haemophilus influenzae NOT DETECTED NOT DETECTED Final   Neisseria meningitidis NOT DETECTED NOT DETECTED Final   Pseudomonas aeruginosa NOT DETECTED NOT DETECTED Final   Candida albicans NOT DETECTED NOT DETECTED Final   Candida glabrata NOT DETECTED NOT DETECTED Final   Candida krusei NOT DETECTED NOT DETECTED Final   Candida parapsilosis NOT DETECTED NOT DETECTED Final   Candida tropicalis NOT DETECTED NOT DETECTED Final  Urine culture     Status: Abnormal   Collection Time: 05/27/16 10:45 AM  Result Value Ref Range Status   Specimen Description URINE, CATHETERIZED  Final   Special Requests NONE  Final   Culture MULTIPLE SPECIES PRESENT, SUGGEST RECOLLECTION (A)  Final   Report Status 05/28/2016 FINAL  Final  MRSA PCR Screening     Status: Abnormal   Collection Time: 05/27/16 12:48 PM  Result Value Ref Range Status   MRSA by PCR POSITIVE (A) NEGATIVE Final    Comment:        The GeneXpert MRSA Assay (FDA approved for NASAL specimens only), is one component of a comprehensive MRSA colonization surveillance program. It is not intended to diagnose MRSA infection nor to guide or monitor treatment for MRSA infections. RESULT  CALLED TO, READ BACK BY AND VERIFIED WITH: S VIVERITO,RN AT 1618 05/27/16 BY L BENFIELD   Culture, blood (Routine X 2) w Reflex to ID Panel     Status: None (Preliminary result)   Collection Time: 05/28/16 12:15 PM  Result Value  Ref Range Status   Specimen Description BLOOD RIGHT HAND  Final   Special Requests IN PEDIATRIC BOTTLE 1CC  Final   Culture NO GROWTH 3 DAYS  Final   Report Status PENDING  Incomplete  Culture, expectorated sputum-assessment     Status: None   Collection Time: 05/29/16 11:48 AM  Result Value Ref Range Status   Specimen Description SPUTUM  Final   Special Requests NONE  Final   Sputum evaluation   Final    THIS SPECIMEN IS ACCEPTABLE. RESPIRATORY CULTURE REPORT TO FOLLOW.   Report Status 05/29/2016 FINAL  Final  Urine culture     Status: Abnormal   Collection Time: 05/29/16 11:48 AM  Result Value Ref Range Status   Specimen Description URINE, RANDOM  Final   Special Requests NONE  Final   Culture (A)  Final    >=100,000 COLONIES/mL METHICILLIN RESISTANT STAPHYLOCOCCUS AUREUS   Report Status 06/01/2016 FINAL  Final   Organism ID, Bacteria METHICILLIN RESISTANT STAPHYLOCOCCUS AUREUS (A)  Final      Susceptibility   Methicillin resistant staphylococcus aureus - MIC*    CIPROFLOXACIN 2 INTERMEDIATE Intermediate     GENTAMICIN <=0.5 SENSITIVE Sensitive     NITROFURANTOIN <=16 SENSITIVE Sensitive     OXACILLIN >=4 RESISTANT Resistant     TETRACYCLINE >=16 RESISTANT Resistant     VANCOMYCIN 1 SENSITIVE Sensitive     TRIMETH/SULFA <=10 SENSITIVE Sensitive     CLINDAMYCIN <=0.25 RESISTANT Resistant     RIFAMPIN <=0.5 SENSITIVE Sensitive     Inducible Clindamycin POSITIVE Resistant     * >=100,000 COLONIES/mL METHICILLIN RESISTANT STAPHYLOCOCCUS AUREUS  Culture, respiratory (NON-Expectorated)     Status: None   Collection Time: 05/29/16 11:48 AM  Result Value Ref Range Status   Specimen Description EXPECTORATED SPUTUM  Final   Special Requests NONE  Final   Gram Stain   Final    ABUNDANT WBC PRESENT, PREDOMINANTLY PMN RARE GRAM POSITIVE COCCI IN PAIRS RARE SQUAMOUS EPITHELIAL CELLS PRESENT    Culture   Final    MODERATE METHICILLIN RESISTANT STAPHYLOCOCCUS AUREUS   Report Status  06/01/2016 FINAL  Final   Organism ID, Bacteria METHICILLIN RESISTANT STAPHYLOCOCCUS AUREUS  Final      Susceptibility   Methicillin resistant staphylococcus aureus - MIC*    CIPROFLOXACIN 1 SENSITIVE Sensitive     ERYTHROMYCIN >=8 RESISTANT Resistant     GENTAMICIN <=0.5 SENSITIVE Sensitive     OXACILLIN >=4 RESISTANT Resistant     TETRACYCLINE >=16 RESISTANT Resistant     VANCOMYCIN 1 SENSITIVE Sensitive     TRIMETH/SULFA <=10 SENSITIVE Sensitive     CLINDAMYCIN <=0.25 RESISTANT Resistant     RIFAMPIN <=0.5 SENSITIVE Sensitive     Inducible Clindamycin POSITIVE Resistant     * MODERATE METHICILLIN RESISTANT STAPHYLOCOCCUS AUREUS    RADIOLOGY STUDIES/RESULTS: Ct Chest W Contrast  Result Date: 05/28/2016 CLINICAL DATA:  70 year old male inpatient with a reported history of right supraclavicular lymphadenopathy and tenderness. Bacteremia. EXAM: CT CHEST WITH CONTRAST TECHNIQUE: Multidetector CT imaging of the chest was performed during intravenous contrast administration. CONTRAST:  81mL ISOVUE-300 IOPAMIDOL (ISOVUE-300) INJECTION 61% COMPARISON:  Chest radiograph from earlier today. 09/07/2015 chest CT. FINDINGS: Cardiovascular: New mild cardiomegaly. Trace pericardial  effusion/ thickening. Left anterior descending, left circumflex and right coronary atherosclerosis. Atherosclerotic nonaneurysmal thoracic aorta. Normal caliber pulmonary arteries. No central pulmonary emboli. Mediastinum/Nodes: No discrete thyroid nodules. Unremarkable esophagus. No axillary adenopathy. New mild right paratracheal adenopathy measuring up to 1.4 cm (series 3/ image 47). New mild subcarinal adenopathy measuring up to 1.4 cm (series 3/image 60). No additional pathologically enlarged mediastinal or hilar nodes. Lungs/Pleura: No pneumothorax. Small right pleural effusion. No left pleural effusion. There is near complete consolidation of the right lower lobe, with some associated volume loss in the right lower lobe, and  focal mild cavitary change in the basilar right lower lobe. Mild compressive atelectasis in the dependent right upper and right middle lobes. Mild hypoventilatory change in the dependent left lower lobe. No significant pulmonary nodules in the aerated portions of the lungs. Upper abdomen: Cholecystectomy. Surgical sutures and clips are noted in the region of the proximal stomach, which causes streak artifact and limits visualization in this region. Musculoskeletal: No aggressive appearing focal osseous lesions. Stable small sclerotic lesion in the T5 vertebral body, probably a benign bone island. Moderate thoracic spondylosis. Moderate symmetric gynecomastia appears stable. IMPRESSION: 1. Chest CT findings are most suggestive of a cavitary right lower lobe pneumonia. 2. Small parapneumonic right pleural effusion with mild compressive atelectasis in the dependent right lung. 3. New mild right paratracheal and subcarinal lymphadenopathy, nonspecific, possibly reactive. 4. A follow-up chest CT with IV contrast is recommended in 3 months to document resolution of these findings. 5. New mild cardiomegaly.  Trace pericardial effusion/thickening. 6. Aortic atherosclerosis.  Three-vessel coronary atherosclerosis. Electronically Signed   By: Ilona Sorrel M.D.   On: 05/28/2016 17:21   Dg Chest Port 1 View  Result Date: 05/28/2016 CLINICAL DATA:  Sepsis EXAM: PORTABLE CHEST 1 VIEW COMPARISON:  05/27/2016 FINDINGS: Cardiomegaly with vascular congestion. Bibasilar atelectasis, right greater than left. Mild interstitial prominence throughout the lungs could reflect mild interstitial edema. This is increased slightly since prior study. IMPRESSION: Low lung volumes with bibasilar atelectasis. Cardiomegaly with vascular congestion and possible mild interstitial edema. Electronically Signed   By: Rolm Baptise M.D.   On: 05/28/2016 07:41   Dg Chest Portable 1 View  Result Date: 05/27/2016 CLINICAL DATA:  Right side chest pain  and SOB x this morning. Hx of HTN, diabetes, CAD, CHF. EXAM: PORTABLE CHEST 1 VIEW COMPARISON:  01/11/2016 FINDINGS: Normal cardiac silhouette. There are low lung volumes with bibasilar atelectasis not changed. No pneumothorax. IMPRESSION: Low lung volumes and bibasilar atelectasis.  No change. Electronically Signed   By: Suzy Bouchard M.D.   On: 05/27/2016 08:37     LOS: 5 days   Oren Binet, MD  [facility] Pager:[phone]  If 7PM-7AM, please contact night-coverage [email or web address] Password [facility] 06/01/2016, 11:50 AM

## 2016-06-01 NOTE — Progress Notes (Signed)
CRITICAL VALUE ALERT  Critical value received:  Vancomycin Trough (21)  Date of notification:  06/01/2016  Time of notification:  1100  Critical value read back:Yes.    Nurse who received alert:  Rico Sheehan (RN)   MD notified (1st page):    Time of first page:    MD notified (2nd page):  Time of second page:  Responding MD:    Time MD responded:   Pharmacy was made aware at 1120

## 2016-06-02 DIAGNOSIS — E872 Acidosis: Secondary | ICD-10-CM | POA: Diagnosis not present

## 2016-06-02 DIAGNOSIS — L98413 Non-pressure chronic ulcer of buttock with necrosis of muscle: Secondary | ICD-10-CM | POA: Diagnosis not present

## 2016-06-02 DIAGNOSIS — E08628 Diabetes mellitus due to underlying condition with other skin complications: Secondary | ICD-10-CM | POA: Diagnosis not present

## 2016-06-02 DIAGNOSIS — I110 Hypertensive heart disease with heart failure: Secondary | ICD-10-CM | POA: Diagnosis not present

## 2016-06-02 DIAGNOSIS — A4902 Methicillin resistant Staphylococcus aureus infection, unspecified site: Secondary | ICD-10-CM | POA: Diagnosis not present

## 2016-06-02 DIAGNOSIS — L97419 Non-pressure chronic ulcer of right heel and midfoot with unspecified severity: Secondary | ICD-10-CM | POA: Diagnosis not present

## 2016-06-02 DIAGNOSIS — N319 Neuromuscular dysfunction of bladder, unspecified: Secondary | ICD-10-CM | POA: Diagnosis not present

## 2016-06-02 DIAGNOSIS — L89329 Pressure ulcer of left buttock, unspecified stage: Secondary | ICD-10-CM | POA: Diagnosis not present

## 2016-06-02 DIAGNOSIS — E1143 Type 2 diabetes mellitus with diabetic autonomic (poly)neuropathy: Secondary | ICD-10-CM | POA: Diagnosis not present

## 2016-06-02 DIAGNOSIS — E785 Hyperlipidemia, unspecified: Secondary | ICD-10-CM | POA: Diagnosis not present

## 2016-06-02 DIAGNOSIS — R0789 Other chest pain: Secondary | ICD-10-CM | POA: Diagnosis not present

## 2016-06-02 DIAGNOSIS — G4700 Insomnia, unspecified: Secondary | ICD-10-CM | POA: Diagnosis not present

## 2016-06-02 DIAGNOSIS — R55 Syncope and collapse: Secondary | ICD-10-CM | POA: Diagnosis not present

## 2016-06-02 DIAGNOSIS — L109 Pemphigus, unspecified: Secondary | ICD-10-CM | POA: Diagnosis not present

## 2016-06-02 DIAGNOSIS — L97519 Non-pressure chronic ulcer of other part of right foot with unspecified severity: Secondary | ICD-10-CM | POA: Diagnosis not present

## 2016-06-02 DIAGNOSIS — M6281 Muscle weakness (generalized): Secondary | ICD-10-CM | POA: Diagnosis not present

## 2016-06-02 DIAGNOSIS — R7881 Bacteremia: Secondary | ICD-10-CM | POA: Diagnosis not present

## 2016-06-02 DIAGNOSIS — A4102 Sepsis due to Methicillin resistant Staphylococcus aureus: Secondary | ICD-10-CM | POA: Diagnosis not present

## 2016-06-02 DIAGNOSIS — E1165 Type 2 diabetes mellitus with hyperglycemia: Secondary | ICD-10-CM | POA: Diagnosis not present

## 2016-06-02 DIAGNOSIS — R918 Other nonspecific abnormal finding of lung field: Secondary | ICD-10-CM | POA: Diagnosis not present

## 2016-06-02 DIAGNOSIS — L98412 Non-pressure chronic ulcer of buttock with fat layer exposed: Secondary | ICD-10-CM | POA: Diagnosis not present

## 2016-06-02 DIAGNOSIS — G2581 Restless legs syndrome: Secondary | ICD-10-CM | POA: Diagnosis not present

## 2016-06-02 DIAGNOSIS — E1169 Type 2 diabetes mellitus with other specified complication: Secondary | ICD-10-CM | POA: Diagnosis not present

## 2016-06-02 DIAGNOSIS — I82891 Chronic embolism and thrombosis of other specified veins: Secondary | ICD-10-CM | POA: Diagnosis not present

## 2016-06-02 DIAGNOSIS — F329 Major depressive disorder, single episode, unspecified: Secondary | ICD-10-CM | POA: Diagnosis not present

## 2016-06-02 DIAGNOSIS — I5033 Acute on chronic diastolic (congestive) heart failure: Secondary | ICD-10-CM | POA: Diagnosis not present

## 2016-06-02 DIAGNOSIS — R0600 Dyspnea, unspecified: Secondary | ICD-10-CM | POA: Diagnosis not present

## 2016-06-02 DIAGNOSIS — M25512 Pain in left shoulder: Secondary | ICD-10-CM | POA: Diagnosis not present

## 2016-06-02 DIAGNOSIS — E1100 Type 2 diabetes mellitus with hyperosmolarity without nonketotic hyperglycemic-hyperosmolar coma (NKHHC): Secondary | ICD-10-CM | POA: Diagnosis not present

## 2016-06-02 DIAGNOSIS — D485 Neoplasm of uncertain behavior of skin: Secondary | ICD-10-CM | POA: Diagnosis not present

## 2016-06-02 DIAGNOSIS — L120 Bullous pemphigoid: Secondary | ICD-10-CM | POA: Diagnosis not present

## 2016-06-02 DIAGNOSIS — I5032 Chronic diastolic (congestive) heart failure: Secondary | ICD-10-CM | POA: Diagnosis not present

## 2016-06-02 DIAGNOSIS — Z794 Long term (current) use of insulin: Secondary | ICD-10-CM | POA: Diagnosis not present

## 2016-06-02 DIAGNOSIS — R079 Chest pain, unspecified: Secondary | ICD-10-CM | POA: Diagnosis not present

## 2016-06-02 DIAGNOSIS — E0800 Diabetes mellitus due to underlying condition with hyperosmolarity without nonketotic hyperglycemic-hyperosmolar coma (NKHHC): Secondary | ICD-10-CM | POA: Diagnosis not present

## 2016-06-02 DIAGNOSIS — I10 Essential (primary) hypertension: Secondary | ICD-10-CM | POA: Diagnosis not present

## 2016-06-02 DIAGNOSIS — L259 Unspecified contact dermatitis, unspecified cause: Secondary | ICD-10-CM | POA: Diagnosis not present

## 2016-06-02 DIAGNOSIS — B9562 Methicillin resistant Staphylococcus aureus infection as the cause of diseases classified elsewhere: Secondary | ICD-10-CM | POA: Diagnosis not present

## 2016-06-02 DIAGNOSIS — I5030 Unspecified diastolic (congestive) heart failure: Secondary | ICD-10-CM | POA: Diagnosis not present

## 2016-06-02 DIAGNOSIS — L89319 Pressure ulcer of right buttock, unspecified stage: Secondary | ICD-10-CM | POA: Diagnosis not present

## 2016-06-02 DIAGNOSIS — J189 Pneumonia, unspecified organism: Secondary | ICD-10-CM | POA: Diagnosis not present

## 2016-06-02 LAB — GLUCOSE, CAPILLARY
GLUCOSE-CAPILLARY: 161 mg/dL — AB (ref 65–99)
Glucose-Capillary: 210 mg/dL — ABNORMAL HIGH (ref 65–99)
Glucose-Capillary: 182 mg/dL — ABNORMAL HIGH (ref 65–99)

## 2016-06-02 LAB — TROPONIN I: Troponin I: <0.03 ng/mL (ref <0.03)

## 2016-06-02 LAB — CULTURE, BLOOD (ROUTINE X 2): Culture: NO GROWTH

## 2016-06-02 MED ORDER — INSULIN ASPART 100 UNIT/ML ~~LOC~~ SOLN: 20.0000 [IU] | Freq: Three times a day (TID) | SUBCUTANEOUS | Status: DC

## 2016-06-02 MED ORDER — SODIUM CHLORIDE 0.9% FLUSH
10.0000 mL | INTRAVENOUS | Status: DC | PRN
  Administered 2016-06-02: 10 mL
  Filled 2016-06-02: qty 40

## 2016-06-02 MED ORDER — VANCOMYCIN HCL IN DEXTROSE 750-5 MG/150ML-% IV SOLN
750.0000 mg | Freq: Two times a day (BID) | INTRAVENOUS | Status: DC
  Administered 2016-06-02: 750 mg via INTRAVENOUS
  Filled 2016-06-02 (×2): qty 150

## 2016-06-02 MED ORDER — INSULIN ASPART 100 UNIT/ML FLEXPEN: PEN_INJECTOR | SUBCUTANEOUS | Status: DC

## 2016-06-02 MED ORDER — VANCOMYCIN HCL IN DEXTROSE 750-5 MG/150ML-% IV SOLN: 750.0000 mg | Freq: Two times a day (BID) | INTRAVENOUS | Status: DC

## 2016-06-02 MED ORDER — GLUCERNA SHAKE PO LIQD: 237.0000 mL | Freq: Three times a day (TID) | ORAL | 0 refills | Status: DC

## 2016-06-02 MED ORDER — INSULIN GLARGINE 100 UNIT/ML ~~LOC~~ SOLN: 30.0000 [IU] | Freq: Every day | SUBCUTANEOUS | Status: DC

## 2016-06-02 MED ORDER — HEPARIN SOD (PORK) LOCK FLUSH 100 UNIT/ML IV SOLN
250.0000 [IU] | INTRAVENOUS | Status: AC | PRN
  Administered 2016-06-02: 250 [IU]

## 2016-06-02 MED ORDER — INSULIN GLARGINE 100 UNIT/ML ~~LOC~~ SOLN: 70.0000 [IU] | Freq: Every day | SUBCUTANEOUS | Status: DC

## 2016-06-02 MED ORDER — SILVER SULFADIAZINE 1 % EX CREA: TOPICAL_CREAM | Freq: Every day | CUTANEOUS | 0 refills | Status: DC

## 2016-06-02 MED ORDER — GUAIFENESIN ER 600 MG PO TB12
600.0000 mg | ORAL_TABLET | Freq: Two times a day (BID) | ORAL | Status: DC
  Administered 2016-06-02: 600 mg via ORAL
  Filled 2016-06-02: qty 1

## 2016-06-02 MED ORDER — GUAIFENESIN ER 600 MG PO TB12: 600.0000 mg | ORAL_TABLET | Freq: Two times a day (BID) | ORAL | Status: DC

## 2016-06-02 MED ORDER — VANCOMYCIN HCL IN DEXTROSE 1-5 GM/200ML-% IV SOLN: 1000.0000 mg | Freq: Two times a day (BID) | INTRAVENOUS | Status: DC

## 2016-06-02 MED ORDER — IPRATROPIUM-ALBUTEROL 0.5-2.5 (3) MG/3ML IN SOLN: 3.0000 mL | RESPIRATORY_TRACT | Status: DC | PRN

## 2016-06-02 MED ORDER — TRAMADOL-ACETAMINOPHEN 37.5-325 MG PO TABS: 1.0000 | ORAL_TABLET | Freq: Four times a day (QID) | ORAL | 0 refills | Status: DC | PRN

## 2016-06-02 MED ORDER — ADULT MULTIVITAMIN W/MINERALS CH: 1.0000 | ORAL_TABLET | Freq: Every day | ORAL | Status: DC

## 2016-06-02 MED ORDER — SODIUM CHLORIDE 0.9% FLUSH: 10.0000 mL | Freq: Two times a day (BID) | INTRAVENOUS | Status: DC

## 2016-06-02 NOTE — Progress Notes (Signed)
Denies chest pain Has had intermittent right shoulder pain for weeks/months-worse on raising right arm PICC line inserted D/C to SNF today.

## 2016-06-02 NOTE — Progress Notes (Signed)
Peripherally Inserted Central Catheter/Midline Placement  The IV Nurse has discussed with the patient and/or persons authorized to consent for the patient, the purpose of this procedure and the potential benefits and risks involved with this procedure.  The benefits include less needle sticks, lab draws from the catheter, and the patient may be discharged home with the catheter. Risks include, but not limited to, infection, bleeding, blood clot (thrombus formation), and puncture of an artery; nerve damage and irregular heartbeat and possibility to perform a PICC exchange if needed/ordered by physician.  Alternatives to this procedure were also discussed.  [facility] PICC patient education guide, fact sheet on infection prevention and patient information card has been provided to patient /or left at bedside.    PICC/Midline Placement Documentation  PICC Single Lumen 06/02/16 PICC Right Brachial 42 cm 0 cm (Active)  Indication for Insertion or Continuance of Line Home intravenous therapies (PICC only) 06/02/2016  8:25 AM  Exposed Catheter (cm) 0 cm 06/02/2016  8:25 AM  Site Assessment Clean;Dry;Intact 06/02/2016  8:25 AM  Line Status Flushed;Saline locked;Blood return noted 06/02/2016  8:25 AM  Dressing Type Transparent 06/02/2016  8:25 AM  Dressing Status Clean;Dry;Intact;Antimicrobial disc in place 06/02/2016  8:25 AM  Line Care Connections checked and tightened 06/02/2016  8:25 AM  Line Adjustment (NICU/IV Team Only) No 06/02/2016  8:25 AM  Dressing Intervention New dressing 06/02/2016  8:25 AM  Dressing Change Due 06/09/16 06/02/2016  8:25 AM       Rolena Infante 06/02/2016, 8:26 AM

## 2016-06-02 NOTE — Discharge Summary (Addendum)
PATIENT DETAILS Name: [name] Age: 70 y.o. Sex: male Date of Birth: 1946/09/06 MRN: [id number]. Admitting Physician: Allie Bossier, MD WUJ:[id number],WGNF Brian John, MD  Admit Date: 05/27/2016 Discharge date: 06/02/2016  Recommendations for Outpatient Follow-up:  1. Follow up with PCP in 1-2 weeks 2. Weekly CBC,CMET and vancomycin trough levels while on intravenous vancomycin. 3. Please remove PICC line once IV antibiotic therapy has been completed 4. Ensure follow-up at the infectious disease clinic 5. May need to hold methotrexate while on IV antibiotics. 6. Contine nocturnal CPAP  Admitted From:  SNF  Disposition: SNF   Home Health: No  Equipment/Devices: PICC line--9/23  Discharge Condition: Stable  CODE STATUS: FULL CODE  Diet recommendation:  Heart Healthy / Carb Modified  Brief Summary: See H&P, Labs, Consult and Test reports for all details in brief, Patient is a 70 y.o. male SNF resident, chronic Foley catheter in place, morbid obesity, sleep apnea on CPAP, history of CAD on medical treatment who presented with sepsis secondary to MRSA bacteremia and healthcare associated pneumonia.  Brief Hospital Course: Sepsis secondary to MRSA bacteremia and healthcare associated pneumonia: Continue empiric vancomycin, Unasyn discontinued on 9/21.  Blood and sputum cultures positive for MRSA, repeat blood cultures on 9/18 negative so far. ID recommending a total of 3 weeks of intravenous vancomycin from 9/18.  PICC line placed on 9/23. Transthoracic echo negative for vegetation, ID not recommending TEE.  Active Problems: Diabetes mellitus due to underlying condition: Uncontrolled initially as on IV steroids, but with further adjustments better control. Continue Lantus 70 units at bedtime, 30 units in the morning along with 20 units of NovoLog with meals. Will need further optimization in the outpatient setting.  A1c 11.7   Hyponatremia: Mild. Appears to be chronic  as well, probably at her glycemia playing a role as well. Continue Demadex, and follow electrolytes as an outpatient.    CAD:-cardiac catheterization in 2016 noted residual disease without any areas amenableto PCI therefore focus was on medical therapy. Continue aspirin.  History of bullous pemphigoid: On prednisone chronically prior to this initially on intravenous hydrocortisone, transitioned back to usual dosing of prednisone . Appears to be on chronic methotrexate is well-this has been discontinued until he completes a course of antibiotics.  History of DVT:Not on chronic anticoagulation - d-dimer not elevated. On prophylactic Lovenox during this hospital stay  Moisture associated skin damage B Buttocks: Continue wound care follow up while at SNF  Morbid obesity /OSA on CPAP:Bedbound and becomes orthostatic with attempts to mobilize - cont nocturnal CPAP  Chronic diastolic CHF / AOZ:HYQMVH daily weights and strict Is/Os. Continue Demadex  Right-sided shoulder pain: Supportive care, site does not look inflamed/erythematous or swollen. Mostly when he raises his arm. Continue to monitor closely in the outpatient setting.  Moderate aortic stenosis: Stable for outpatient follow-up  Hyperlipidemia:Not on statin prior to admission  Hx CVA   Neurogenic bladder: Chronic Foley catheter in place   Procedures/Studies: Echo 8/46>> normal systolic function, mild to moderate aortic stenosis.  Discharge Diagnoses:  Principal Problem:   Sepsis ([place]) Active Problems:   Diabetes mellitus due to underlying condition, uncontrolled, with hyperosmolarity without coma, with long-term current use of insulin (HCC)   OSA (obstructive sleep apnea)   Chronic indwelling Foley catheter   Chronic diastolic CHF (congestive heart failure) (HCC)   History of DVT (deep vein thrombosis)   Pressure ulcer   Neurogenic bladder   Hyperlipidemia LDL goal <70   Morbid obesity ([place])   CAD in  native  artery   H/O: CVA (cerebrovascular accident)   Bullous pemphigus   OSA on CPAP   Chest pain   MRSA bacteremia   Severe sepsis ([place])   Cavitary pneumonia   HCAP (healthcare-associated pneumonia)   Uncontrolled type 2 diabetes mellitus with complication [facility])   Discharge Instructions:  Activity:  As tolerated with Full fall precautions use walker/cane & assistance as needed   Discharge Instructions    (HEART FAILURE PATIENTS) Call MD:  Anytime you have any of the following symptoms: 1) 3 pound weight gain in 24 hours or 5 pounds in 1 week 2) shortness of breath, with or without a dry hacking cough 3) swelling in the hands, feet or stomach 4) if you have to sleep on extra pillows at night in order to breathe.    Complete by:  As directed    Call MD for:  persistant nausea and vomiting    Complete by:  As directed    Call MD for:  temperature >100.4    Complete by:  As directed    Diet - low sodium heart healthy    Complete by:  As directed    Diet general    Complete by:  As directed    Discharge instructions    Complete by:  As directed    Check CBGs before meals and at bedtime Weekly CBC,CMET, and vancomycin trough levels-fax results to the infectious disease clinic (attention Dr. Johnnye Sima)   Increase activity slowly    Complete by:  As directed        Medication List    STOP taking these medications   doxycycline 100 MG capsule Commonly known as:  VIBRAMYCIN   feeding supplement (PRO-STAT SUGAR FREE 64) Liqd   insulin NPH Human 100 UNIT/ML injection Commonly known as:  HUMULIN N,NOVOLIN N   methotrexate 2.5 MG tablet Commonly known as:  RHEUMATREX   Naproxen Sodium 220 MG Caps   SOLIQUA 100-33 UNT-MCG/ML Sopn Generic drug:  Insulin Glargine-Lixisenatide   tuberculin 5 UNIT/0.1ML injection     TAKE these medications   aluminum-magnesium hydroxide-simethicone 433-295-18 MG/5ML Susp Commonly known as:  MAALOX Take 30 mLs by mouth every 6 (six) hours as  needed.   aspirin EC 81 MG tablet Take 81 mg by mouth daily. 0900   escitalopram 10 MG tablet Commonly known as:  LEXAPRO Take 10 mg by mouth daily.   feeding supplement (GLUCERNA SHAKE) Liqd Take 237 mLs by mouth 3 (three) times daily between meals.   folic acid 1 MG tablet Commonly known as:  FOLVITE Take 1 mg by mouth daily.   gabapentin 100 MG capsule Commonly known as:  NEURONTIN Take 1 capsule (100 mg total) by mouth 2 (two) times daily.   guaiFENesin 600 MG 12 hr tablet Commonly known as:  MUCINEX Take 1 tablet (600 mg total) by mouth 2 (two) times daily.   hydrOXYzine 25 MG tablet Commonly known as:  ATARAX/VISTARIL Take 25 mg by mouth 4 (four) times daily.   insulin aspart 100 UNIT/ML FlexPen Commonly known as:  NOVOLOG FLEXPEN 0-20 Units, Subcutaneous, 3 times daily with meals Correction coverage: CBG < 70: implement hypoglycemia protocol CBG 70 - 120: 0 units CBG 121 - 150: 3 units CBG 151 - 200: 4 units CBG 201 - 250: 7 units CBG 251 - 300: 11 units CBG 301 - 350: 15 units CBG 351 - 400: 20 units CBG > 400: call MD What changed:  how much to take  how  to take this  when to take this  additional instructions   insulin aspart 100 UNIT/ML injection Commonly known as:  novoLOG Inject 20 Units into the skin 3 (three) times daily with meals. What changed:  You were already taking a medication with the same name, and this prescription was added. Make sure you understand how and when to take each.   insulin glargine 100 UNIT/ML injection Commonly known as:  LANTUS Inject 0.3 mLs (30 Units total) into the skin daily.   insulin glargine 100 UNIT/ML injection Commonly known as:  LANTUS Inject 0.7 mLs (70 Units total) into the skin at bedtime.   ipratropium-albuterol 0.5-2.5 (3) MG/3ML Soln Commonly known as:  DUONEB Take 3 mLs by nebulization every 4 (four) hours as needed.   magnesium oxide 400 MG tablet Commonly known as:  MAG-OX Take 400 mg by mouth  daily. 0900   metoprolol tartrate 25 MG tablet Commonly known as:  LOPRESSOR 0.5 tablet by mouth two times a day 0900 , 2100   multivitamin with minerals Tabs tablet Take 1 tablet by mouth daily.   nitroGLYCERIN 0.4 MG SL tablet Commonly known as:  NITROSTAT Place 0.4 mg under the tongue every 5 (five) minutes as needed for chest pain.   NORVASC 5 MG tablet Generic drug:  amLODipine Take 5 mg by mouth daily.   omeprazole 40 MG capsule Commonly known as:  PRILOSEC Take 40 mg by mouth daily. 0600   potassium chloride 20 MEQ packet Commonly known as:  KLOR-CON Take 40 mEq by mouth daily. 0800   predniSONE 20 MG tablet Commonly known as:  DELTASONE Take 40 mg by mouth daily with breakfast.   rOPINIRole 0.5 MG tablet Commonly known as:  REQUIP Take 0.5 mg by mouth at bedtime. 2100   sennosides-docusate sodium 8.6-50 MG tablet Commonly known as:  SENOKOT-S Take 2 tablets by mouth at bedtime. 2100   silver sulfADIAZINE 1 % cream Commonly known as:  SILVADENE Apply topically daily. Topical, Daily Apply thick layer of Silvadene to bilat legs and feet Q day, then cover with ABD pads and kerlex, beginning just behind toes to below knees   torsemide 20 MG tablet Commonly known as:  DEMADEX Take 20 mg by mouth 2 (two) times daily.   traMADol-acetaminophen 37.5-325 MG tablet Commonly known as:  ULTRACET Take 1-2 tablets by mouth every 6 (six) hours as needed for moderate pain. What changed:  how much to take   traZODone 50 MG tablet Commonly known as:  DESYREL Take 25 mg by mouth at bedtime.   Vancomycin 750-5 MG/150ML-% Soln Commonly known as:  VANCOCIN Inject 150 mLs (750 mg total) into the vein every 12 (twelve) hours. 3 weeks from 9/18   vitamin C 500 MG tablet Commonly known as:  ASCORBIC ACID Take 500 mg by mouth 2 (two) times daily. 0900 and 2100      Follow-up Information    Irven Shelling, MD. Schedule an appointment as soon as possible for a visit in  1 week(s).   Specialty:  Internal Medicine Contact information: [address] [place] [address] [phone]        Bobby Rumpf, MD. Schedule an appointment as soon as possible for a visit in 1 month(s).   Specialty:  Infectious Diseases Contact information: [address] [place] [address] [phone]          Allergies  Allergen Reactions  . Ace Inhibitors Swelling    Pt tolerates  lisinopril  . Lipitor [Atorvastatin Calcium] Swelling  . Metformin And Related Swelling  . Cefadroxil Hives  . Cephalexin Hives  . Morphine And Related Other (See Comments)    Sweating, feels like is "in rocky boat."  . Robaxin [Methocarbamol] Other (See Comments)    Feels like he is shaky      Consultations:  ID   Other Procedures/Studies: Ct Chest W Contrast  Result Date: 05/28/2016 CLINICAL DATA:  70 year old male inpatient with a reported history of right supraclavicular lymphadenopathy and tenderness. Bacteremia. EXAM: CT CHEST WITH CONTRAST TECHNIQUE: Multidetector CT imaging of the chest was performed during intravenous contrast administration. CONTRAST:  37mL ISOVUE-300 IOPAMIDOL (ISOVUE-300) INJECTION 61% COMPARISON:  Chest radiograph from earlier today. 09/07/2015 chest CT. FINDINGS: Cardiovascular: New mild cardiomegaly. Trace pericardial effusion/ thickening. Left anterior descending, left circumflex and right coronary atherosclerosis. Atherosclerotic nonaneurysmal thoracic aorta. Normal caliber pulmonary arteries. No central pulmonary emboli. Mediastinum/Nodes: No discrete thyroid nodules. Unremarkable esophagus. No axillary adenopathy. New mild right paratracheal adenopathy measuring up to 1.4 cm (series 3/ image 47). New mild subcarinal adenopathy measuring up to 1.4 cm (series 3/image 60). No additional pathologically enlarged mediastinal or hilar nodes. Lungs/Pleura: No pneumothorax. Small right pleural effusion. No left pleural  effusion. There is near complete consolidation of the right lower lobe, with some associated volume loss in the right lower lobe, and focal mild cavitary change in the basilar right lower lobe. Mild compressive atelectasis in the dependent right upper and right middle lobes. Mild hypoventilatory change in the dependent left lower lobe. No significant pulmonary nodules in the aerated portions of the lungs. Upper abdomen: Cholecystectomy. Surgical sutures and clips are noted in the region of the proximal stomach, which causes streak artifact and limits visualization in this region. Musculoskeletal: No aggressive appearing focal osseous lesions. Stable small sclerotic lesion in the T5 vertebral body, probably a benign bone island. Moderate thoracic spondylosis. Moderate symmetric gynecomastia appears stable. IMPRESSION: 1. Chest CT findings are most suggestive of a cavitary right lower lobe pneumonia. 2. Small parapneumonic right pleural effusion with mild compressive atelectasis in the dependent right lung. 3. New mild right paratracheal and subcarinal lymphadenopathy, nonspecific, possibly reactive. 4. A follow-up chest CT with IV contrast is recommended in 3 months to document resolution of these findings. 5. New mild cardiomegaly.  Trace pericardial effusion/thickening. 6. Aortic atherosclerosis.  Three-vessel coronary atherosclerosis. Electronically Signed   By: Ilona Sorrel M.D.   On: 05/28/2016 17:21   Dg Chest Port 1 View  Result Date: 06/01/2016 CLINICAL DATA:  Shortness of Breath EXAM: PORTABLE CHEST 1 VIEW COMPARISON:  05/28/2016 FINDINGS: Right-sided pleural effusion and underlying lower lobe consolidation is again noted. The left lung remains clear. Cardiac shadow is mildly enlarged but accentuated by the portable technique. No acute bony abnormality is seen. IMPRESSION: No significant interval change in right effusion and lower lobe consolidation. Electronically Signed   By: Inez Catalina M.D.   On:  06/01/2016 12:19   Dg Chest Port 1 View  Result Date: 05/28/2016 CLINICAL DATA:  Sepsis EXAM: PORTABLE CHEST 1 VIEW COMPARISON:  05/27/2016 FINDINGS: Cardiomegaly with vascular congestion. Bibasilar atelectasis, right greater than left. Mild interstitial prominence throughout the lungs could reflect mild interstitial edema. This is increased slightly since prior study. IMPRESSION: Low lung volumes with bibasilar atelectasis. Cardiomegaly with vascular congestion and possible mild interstitial edema. Electronically Signed   By: Rolm Baptise M.D.   On: 05/28/2016 07:41   Dg Chest Portable 1 View  Result Date: 05/27/2016 CLINICAL  DATA:  Right side chest pain and SOB x this morning. Hx of HTN, diabetes, CAD, CHF. EXAM: PORTABLE CHEST 1 VIEW COMPARISON:  01/11/2016 FINDINGS: Normal cardiac silhouette. There are low lung volumes with bibasilar atelectasis not changed. No pneumothorax. IMPRESSION: Low lung volumes and bibasilar atelectasis.  No change. Electronically Signed   By: Suzy Bouchard M.D.   On: 05/27/2016 08:37     TODAY-DAY OF DISCHARGE:  Subjective:   [name] today has no headache,no chest abdominal pain,no new weakness tingling or numbness  Objective:   Blood pressure 118/67, pulse 76, temperature 97.6 F (36.4 C), temperature source Oral, resp. rate 20, weight (!) 158.2 kg (348 lb 12.3 oz), SpO2 95 %.  Intake/Output Summary (Last 24 hours) at 06/02/16 1030 Last data filed at 06/02/16 6063  Gross per 24 hour  Intake              640 ml  Output             5875 ml  Net            -5235 ml   Filed Weights   06/01/16 0500 06/01/16 2225 06/02/16 0343  Weight: (!) 159 kg (350 lb 8.5 oz) (!) 158.2 kg (348 lb 12.3 oz) (!) 158.2 kg (348 lb 12.3 oz)    Exam: Awake Alert, Oriented *3, No new F.N deficits, Normal affect [place].AT,PERRAL Supple Neck,No JVD, No cervical lymphadenopathy appriciated.  Symmetrical Chest wall movement, Good air movement bilaterally, CTAB RRR,No  Gallops,Rubs or new Murmurs, No Parasternal Heave +ve B.Sounds, Abd Soft, Non tender, No organomegaly appriciated, No rebound -guarding or rigidity. No Cyanosis, Clubbing or edema, No new Rash or bruise   PERTINENT RADIOLOGIC STUDIES: Ct Chest W Contrast  Result Date: 05/28/2016 CLINICAL DATA:  70 year old male inpatient with a reported history of right supraclavicular lymphadenopathy and tenderness. Bacteremia. EXAM: CT CHEST WITH CONTRAST TECHNIQUE: Multidetector CT imaging of the chest was performed during intravenous contrast administration. CONTRAST:  64mL ISOVUE-300 IOPAMIDOL (ISOVUE-300) INJECTION 61% COMPARISON:  Chest radiograph from earlier today. 09/07/2015 chest CT. FINDINGS: Cardiovascular: New mild cardiomegaly. Trace pericardial effusion/ thickening. Left anterior descending, left circumflex and right coronary atherosclerosis. Atherosclerotic nonaneurysmal thoracic aorta. Normal caliber pulmonary arteries. No central pulmonary emboli. Mediastinum/Nodes: No discrete thyroid nodules. Unremarkable esophagus. No axillary adenopathy. New mild right paratracheal adenopathy measuring up to 1.4 cm (series 3/ image 47). New mild subcarinal adenopathy measuring up to 1.4 cm (series 3/image 60). No additional pathologically enlarged mediastinal or hilar nodes. Lungs/Pleura: No pneumothorax. Small right pleural effusion. No left pleural effusion. There is near complete consolidation of the right lower lobe, with some associated volume loss in the right lower lobe, and focal mild cavitary change in the basilar right lower lobe. Mild compressive atelectasis in the dependent right upper and right middle lobes. Mild hypoventilatory change in the dependent left lower lobe. No significant pulmonary nodules in the aerated portions of the lungs. Upper abdomen: Cholecystectomy. Surgical sutures and clips are noted in the region of the proximal stomach, which causes streak artifact and limits visualization in this  region. Musculoskeletal: No aggressive appearing focal osseous lesions. Stable small sclerotic lesion in the T5 vertebral body, probably a benign bone island. Moderate thoracic spondylosis. Moderate symmetric gynecomastia appears stable. IMPRESSION: 1. Chest CT findings are most suggestive of a cavitary right lower lobe pneumonia. 2. Small parapneumonic right pleural effusion with mild compressive atelectasis in the dependent right lung. 3. New mild right paratracheal and subcarinal lymphadenopathy, nonspecific, possibly reactive.  4. A follow-up chest CT with IV contrast is recommended in 3 months to document resolution of these findings. 5. New mild cardiomegaly.  Trace pericardial effusion/thickening. 6. Aortic atherosclerosis.  Three-vessel coronary atherosclerosis. Electronically Signed   By: Ilona Sorrel M.D.   On: 05/28/2016 17:21   Dg Chest Port 1 View  Result Date: 06/01/2016 CLINICAL DATA:  Shortness of Breath EXAM: PORTABLE CHEST 1 VIEW COMPARISON:  05/28/2016 FINDINGS: Right-sided pleural effusion and underlying lower lobe consolidation is again noted. The left lung remains clear. Cardiac shadow is mildly enlarged but accentuated by the portable technique. No acute bony abnormality is seen. IMPRESSION: No significant interval change in right effusion and lower lobe consolidation. Electronically Signed   By: Inez Catalina M.D.   On: 06/01/2016 12:19   Dg Chest Port 1 View  Result Date: 05/28/2016 CLINICAL DATA:  Sepsis EXAM: PORTABLE CHEST 1 VIEW COMPARISON:  05/27/2016 FINDINGS: Cardiomegaly with vascular congestion. Bibasilar atelectasis, right greater than left. Mild interstitial prominence throughout the lungs could reflect mild interstitial edema. This is increased slightly since prior study. IMPRESSION: Low lung volumes with bibasilar atelectasis. Cardiomegaly with vascular congestion and possible mild interstitial edema. Electronically Signed   By: Rolm Baptise M.D.   On: 05/28/2016 07:41    Dg Chest Portable 1 View  Result Date: 05/27/2016 CLINICAL DATA:  Right side chest pain and SOB x this morning. Hx of HTN, diabetes, CAD, CHF. EXAM: PORTABLE CHEST 1 VIEW COMPARISON:  01/11/2016 FINDINGS: Normal cardiac silhouette. There are low lung volumes with bibasilar atelectasis not changed. No pneumothorax. IMPRESSION: Low lung volumes and bibasilar atelectasis.  No change. Electronically Signed   By: Suzy Bouchard M.D.   On: 05/27/2016 08:37     PERTINENT LAB RESULTS: CBC: No results for input(s): WBC, HGB, HCT, PLT in the last 72 hours. CMET CMP     Component Value Date/Time   NA 129 (L) 06/01/2016 1909   NA 137 05/09/2016   K 4.9 06/01/2016 1909   CL 93 (L) 06/01/2016 1909   CO2 28 06/01/2016 1909   GLUCOSE 258 (H) 06/01/2016 1909   BUN 21 (H) 06/01/2016 1909   BUN 22 (A) 05/09/2016   CREATININE 0.96 06/01/2016 1909   CREATININE 0.81 09/20/2014 1537   CALCIUM 8.4 (L) 06/01/2016 1909   PROT 5.2 (L) 05/29/2016 0300   ALBUMIN 1.9 (L) 05/29/2016 0300   AST 45 (H) 05/29/2016 0300   ALT 44 05/29/2016 0300   ALKPHOS 209 (H) 05/29/2016 0300   BILITOT 0.8 05/29/2016 0300   GFRNONAA >60 06/01/2016 1909   GFRAA >60 06/01/2016 1909    GFR Estimated Creatinine Clearance: 115.8 mL/min (by C-G formula based on SCr of 0.96 mg/dL). No results for input(s): LIPASE, AMYLASE in the last 72 hours.  Recent Labs  06/01/16 1909 06/02/16 0514  TROPONINI <0.03 <0.03   Invalid input(s): POCBNP No results for input(s): DDIMER in the last 72 hours. No results for input(s): HGBA1C in the last 72 hours. No results for input(s): CHOL, HDL, LDLCALC, TRIG, CHOLHDL, LDLDIRECT in the last 72 hours. No results for input(s): TSH, T4TOTAL, T3FREE, THYROIDAB in the last 72 hours.  Invalid input(s): FREET3 No results for input(s): VITAMINB12, FOLATE, FERRITIN, TIBC, IRON, RETICCTPCT in the last 72 hours. Coags: No results for input(s): INR in the last 72 hours.  Invalid input(s):  PT Microbiology: Recent Results (from the past 240 hour(s))  Blood Culture (routine x 2)     Status: None   Collection Time: 05/27/16  7:55 AM  Result Value Ref Range Status   Specimen Description BLOOD RIGHT UPPER ARM  Final   Special Requests IN PEDIATRIC BOTTLE  2CC  Final   Culture NO GROWTH 5 DAYS  Final   Report Status 06/01/2016 FINAL  Final  Blood Culture (routine x 2)     Status: Abnormal   Collection Time: 05/27/16  8:08 AM  Result Value Ref Range Status   Specimen Description BLOOD RIGHT UPPER ARM  Final   Special Requests BOTTLES DRAWN AEROBIC AND ANAEROBIC  5CC  Final   Culture  Setup Time   Final    IN BOTH AEROBIC AND ANAEROBIC BOTTLES GRAM POSITIVE COCCI IN CLUSTERS CRITICAL RESULT CALLED TO, READ BACK BY AND VERIFIED WITH: TO GABBOTT(PHARD) BY TCLEVELAND AT 1:59AM    Culture METHICILLIN RESISTANT STAPHYLOCOCCUS AUREUS (A)  Final   Report Status 05/30/2016 FINAL  Final   Organism ID, Bacteria METHICILLIN RESISTANT STAPHYLOCOCCUS AUREUS  Final      Susceptibility   Methicillin resistant staphylococcus aureus - MIC*    CIPROFLOXACIN 1 SENSITIVE Sensitive     ERYTHROMYCIN >=8 RESISTANT Resistant     GENTAMICIN <=0.5 SENSITIVE Sensitive     OXACILLIN >=4 RESISTANT Resistant     TETRACYCLINE >=16 RESISTANT Resistant     VANCOMYCIN 1 SENSITIVE Sensitive     TRIMETH/SULFA <=10 SENSITIVE Sensitive     CLINDAMYCIN <=0.25 RESISTANT Resistant     RIFAMPIN <=0.5 SENSITIVE Sensitive     Inducible Clindamycin POSITIVE Resistant     * METHICILLIN RESISTANT STAPHYLOCOCCUS AUREUS  Blood Culture ID Panel (Reflexed)     Status: Abnormal   Collection Time: 05/27/16  8:08 AM  Result Value Ref Range Status   Enterococcus species NOT DETECTED NOT DETECTED Final   Vancomycin resistance NOT DETECTED NOT DETECTED Final   Listeria monocytogenes NOT DETECTED NOT DETECTED Final   Staphylococcus species DETECTED (A) NOT DETECTED Final    Comment: CRITICAL RESULT CALLED TO, READ BACK BY  AND VERIFIED WITH: TO GABBOTT(PHARD) BY TCLEVELAND AT 1:59AM    Staphylococcus aureus DETECTED (A) NOT DETECTED Final    Comment: CRITICAL RESULT CALLED TO, READ BACK BY AND VERIFIED WITH: TO GABBOTT(PHARD) BY TCLEVELAND AT 1:59AM    Methicillin resistance DETECTED (A) NOT DETECTED Final    Comment: CRITICAL RESULT CALLED TO, READ BACK BY AND VERIFIED WITH: TO GABBOTT(PHARD) BY TCLEVELAND AT 2:06    Streptococcus species NOT DETECTED NOT DETECTED Final   Streptococcus agalactiae NOT DETECTED NOT DETECTED Final   Streptococcus pneumoniae NOT DETECTED NOT DETECTED Final   Streptococcus pyogenes NOT DETECTED NOT DETECTED Final   Acinetobacter baumannii NOT DETECTED NOT DETECTED Final   Enterobacteriaceae species NOT DETECTED NOT DETECTED Final   Enterobacter cloacae complex NOT DETECTED NOT DETECTED Final   Escherichia coli NOT DETECTED NOT DETECTED Final   Klebsiella oxytoca NOT DETECTED NOT DETECTED Final   Klebsiella pneumoniae NOT DETECTED NOT DETECTED Final   Proteus species NOT DETECTED NOT DETECTED Final   Serratia marcescens NOT DETECTED NOT DETECTED Final   Carbapenem resistance NOT DETECTED NOT DETECTED Final   Haemophilus influenzae NOT DETECTED NOT DETECTED Final   Neisseria meningitidis NOT DETECTED NOT DETECTED Final   Pseudomonas aeruginosa NOT DETECTED NOT DETECTED Final   Candida albicans NOT DETECTED NOT DETECTED Final   Candida glabrata NOT DETECTED NOT DETECTED Final   Candida krusei NOT DETECTED NOT DETECTED Final   Candida parapsilosis NOT DETECTED NOT DETECTED Final   Candida tropicalis NOT DETECTED NOT DETECTED Final  Urine culture     Status: Abnormal   Collection Time: 05/27/16 10:45 AM  Result Value Ref Range Status   Specimen Description URINE, CATHETERIZED  Final   Special Requests NONE  Final   Culture MULTIPLE SPECIES PRESENT, SUGGEST RECOLLECTION (A)  Final   Report Status 05/28/2016 FINAL  Final  MRSA PCR Screening     Status: Abnormal    Collection Time: 05/27/16 12:48 PM  Result Value Ref Range Status   MRSA by PCR POSITIVE (A) NEGATIVE Final    Comment:        The GeneXpert MRSA Assay (FDA approved for NASAL specimens only), is one component of a comprehensive MRSA colonization surveillance program. It is not intended to diagnose MRSA infection nor to guide or monitor treatment for MRSA infections. RESULT CALLED TO, READ BACK BY AND VERIFIED WITH: S VIVERITO,RN AT 1618 05/27/16 BY L BENFIELD   Culture, blood (Routine X 2) w Reflex to ID Panel     Status: None (Preliminary result)   Collection Time: 05/28/16 12:15 PM  Result Value Ref Range Status   Specimen Description BLOOD RIGHT HAND  Final   Special Requests IN PEDIATRIC BOTTLE 1CC  Final   Culture NO GROWTH 4 DAYS  Final   Report Status PENDING  Incomplete  Culture, expectorated sputum-assessment     Status: None   Collection Time: 05/29/16 11:48 AM  Result Value Ref Range Status   Specimen Description SPUTUM  Final   Special Requests NONE  Final   Sputum evaluation   Final    THIS SPECIMEN IS ACCEPTABLE. RESPIRATORY CULTURE REPORT TO FOLLOW.   Report Status 05/29/2016 FINAL  Final  Urine culture     Status: Abnormal   Collection Time: 05/29/16 11:48 AM  Result Value Ref Range Status   Specimen Description URINE, RANDOM  Final   Special Requests NONE  Final   Culture (A)  Final    >=100,000 COLONIES/mL METHICILLIN RESISTANT STAPHYLOCOCCUS AUREUS   Report Status 06/01/2016 FINAL  Final   Organism ID, Bacteria METHICILLIN RESISTANT STAPHYLOCOCCUS AUREUS (A)  Final      Susceptibility   Methicillin resistant staphylococcus aureus - MIC*    CIPROFLOXACIN 2 INTERMEDIATE Intermediate     GENTAMICIN <=0.5 SENSITIVE Sensitive     NITROFURANTOIN <=16 SENSITIVE Sensitive     OXACILLIN >=4 RESISTANT Resistant     TETRACYCLINE >=16 RESISTANT Resistant     VANCOMYCIN 1 SENSITIVE Sensitive     TRIMETH/SULFA <=10 SENSITIVE Sensitive     CLINDAMYCIN <=0.25  RESISTANT Resistant     RIFAMPIN <=0.5 SENSITIVE Sensitive     Inducible Clindamycin POSITIVE Resistant     * >=100,000 COLONIES/mL METHICILLIN RESISTANT STAPHYLOCOCCUS AUREUS  Culture, respiratory (NON-Expectorated)     Status: None   Collection Time: 05/29/16 11:48 AM  Result Value Ref Range Status   Specimen Description EXPECTORATED SPUTUM  Final   Special Requests NONE  Final   Gram Stain   Final    ABUNDANT WBC PRESENT, PREDOMINANTLY PMN RARE GRAM POSITIVE COCCI IN PAIRS RARE SQUAMOUS EPITHELIAL CELLS PRESENT    Culture   Final    MODERATE METHICILLIN RESISTANT STAPHYLOCOCCUS AUREUS   Report Status 06/01/2016 FINAL  Final   Organism ID, Bacteria METHICILLIN RESISTANT STAPHYLOCOCCUS AUREUS  Final      Susceptibility   Methicillin resistant staphylococcus aureus - MIC*    CIPROFLOXACIN 1 SENSITIVE Sensitive     ERYTHROMYCIN >=8 RESISTANT Resistant     GENTAMICIN <=0.5 SENSITIVE Sensitive  OXACILLIN >=4 RESISTANT Resistant     TETRACYCLINE >=16 RESISTANT Resistant     VANCOMYCIN 1 SENSITIVE Sensitive     TRIMETH/SULFA <=10 SENSITIVE Sensitive     CLINDAMYCIN <=0.25 RESISTANT Resistant     RIFAMPIN <=0.5 SENSITIVE Sensitive     Inducible Clindamycin POSITIVE Resistant     * MODERATE METHICILLIN RESISTANT STAPHYLOCOCCUS AUREUS    FURTHER DISCHARGE INSTRUCTIONS:  Get Medicines reviewed and adjusted: Please take all your medications with you for your next visit with your Primary MD  Laboratory/radiological data: Please request your Primary MD to go over all hospital tests and procedure/radiological results at the follow up, please ask your Primary MD to get all Hospital records sent to his/her office.  In some cases, they will be blood work, cultures and biopsy results pending at the time of your discharge. Please request that your primary care M.D. goes through all the records of your hospital data and follows up on these results.  Also Note the following: If you  experience worsening of your admission symptoms, develop shortness of breath, life threatening emergency, suicidal or homicidal thoughts you must seek medical attention immediately by calling 911 or calling your MD immediately  if symptoms less severe.  You must read complete instructions/literature along with all the possible adverse reactions/side effects for all the Medicines you take and that have been prescribed to you. Take any new Medicines after you have completely understood and accpet all the possible adverse reactions/side effects.   Do not drive when taking Pain medications or sleeping medications (Benzodaizepines)  Do not take more than prescribed Pain, Sleep and Anxiety Medications. It is not advisable to combine anxiety,sleep and pain medications without talking with your primary care practitioner  Special Instructions: If you have smoked or chewed Tobacco  in the last 2 yrs please stop smoking, stop any regular Alcohol  and or any Recreational drug use.  Wear Seat belts while driving.  Please note: You were cared for by a hospitalist during your hospital stay. Once you are discharged, your primary care physician will handle any further medical issues. Please note that NO REFILLS for any discharge medications will be authorized once you are discharged, as it is imperative that you return to your primary care physician (or establish a relationship with a primary care physician if you do not have one) for your post hospital discharge needs so that they can reassess your need for medications and monitor your lab values.  Total Time spent coordinating discharge including counseling, education and face to face time equals 45 minutes.  SignedOren Binet 06/02/2016 10:30 AM

## 2016-06-02 NOTE — Progress Notes (Signed)
Patient is set to discharge to [facility] today. Patient, friend Gerald Stabs), and family Linna Hoff and [place])  aware. Discharge packet given to RN. [facility] called for transport.   Kingsley Spittle, [place] Clinical Social Worker [phone]

## 2016-06-03 DIAGNOSIS — M25551 Pain in right hip: Secondary | ICD-10-CM | POA: Insufficient documentation

## 2016-06-04 ENCOUNTER — Encounter: Payer: Self-pay | Admitting: Internal Medicine

## 2016-06-04 ENCOUNTER — Non-Acute Institutional Stay (SKILLED_NURSING_FACILITY): Payer: Medicare Other | Admitting: Internal Medicine

## 2016-06-04 DIAGNOSIS — I5032 Chronic diastolic (congestive) heart failure: Secondary | ICD-10-CM

## 2016-06-04 DIAGNOSIS — L109 Pemphigus, unspecified: Secondary | ICD-10-CM

## 2016-06-04 DIAGNOSIS — R0600 Dyspnea, unspecified: Secondary | ICD-10-CM

## 2016-06-04 DIAGNOSIS — E1143 Type 2 diabetes mellitus with diabetic autonomic (poly)neuropathy: Secondary | ICD-10-CM | POA: Diagnosis not present

## 2016-06-04 DIAGNOSIS — R7881 Bacteremia: Secondary | ICD-10-CM | POA: Diagnosis not present

## 2016-06-04 DIAGNOSIS — IMO0002 Reserved for concepts with insufficient information to code with codable children: Secondary | ICD-10-CM

## 2016-06-04 DIAGNOSIS — J189 Pneumonia, unspecified organism: Secondary | ICD-10-CM

## 2016-06-04 DIAGNOSIS — G4700 Insomnia, unspecified: Secondary | ICD-10-CM | POA: Diagnosis not present

## 2016-06-04 DIAGNOSIS — E1165 Type 2 diabetes mellitus with hyperglycemia: Secondary | ICD-10-CM

## 2016-06-04 DIAGNOSIS — B9562 Methicillin resistant Staphylococcus aureus infection as the cause of diseases classified elsewhere: Secondary | ICD-10-CM

## 2016-06-04 DIAGNOSIS — F329 Major depressive disorder, single episode, unspecified: Secondary | ICD-10-CM | POA: Diagnosis not present

## 2016-06-04 NOTE — Progress Notes (Signed)
Patient ID: [name], male   DOB: 1946-05-16, 70 y.o.   MRN: [id number]    HISTORY AND PHYSICAL   DATE: 06/04/2016  Location:    [facility] Room Number: 216-B Place of Service: SNF (31)   Extended Emergency Contact Information Primary Emergency Contact: Whiting,[name] Address: [address]          [place], [place] [address] [name] of [place] Mobile Phone: [phone] Relation: Friend Secondary Emergency Contact: [place] of [place] Phone: [phone] Mobile Phone: [phone] Relation: Brother  Advanced Directive information Does patient have an advance directive?: Yes, Does patient want to make changes to advanced directive?: No - Patient declined  Chief Complaint  Patient presents with  . Readmit To SNF    HPI:  70 yo male long term resident seen today for readmission into SNF following hospital stay for sepsis due to MRSA bacteremia/HCAP, DM, morbid obesity, OSA on CPAP, chronic dHF, HTN, right shoulder pain, moderate AS, hyperlipidemia, hx CVA, neurogenic bladder/chronic foley cath, bullous pemphigus, pressure ulcer. Blood and sputum cx (+) MRSA. Repeat BC prior to d/c neg. ID followed pt and recommended 3 weeks of IV vanco from 9/18th. PICC line place 9/23rd. TTE neg for vegetation. A1c 11.7 and insulin regimen adjusted and soliqua stopped due to higher insulin dose. He rec'd IV steroids during admission --> po prednisone. Albumin 1.9. He presents to SNF for continued long term care  He c/o SOB intermittently. No f/c. No CP. No nursing issues. Rash improving. Appetite reduced. Sleeps ok. No falls.  GERD - stable on prilosec 40 mg daily   Chronic Diastolic heart failure -  EF is 55-60%. Takes  demadex 20 mg twice daily with k+ 40 meq daily   Hypertension - BP stable on lopressor 12.5 mg twice daily; noravsc 5 mg daily   Neurogenic bladder - s/p chronic indwelling foley  RLS - stable on requip 0.5 mg nightly    DM - uncontrolled. CBG 122 today. No low BS reactions. He takes  Novolog qAC and lantus. A1c 11.7%  CAD - stable. He takes asa 81 mg daily and has ntg prn  Allergic rhinitis - stable off meds  Hx CVA - stable on asa 81 mg daily   Depression - mood stable on lexapro 10 mg daily   Pruritis - due to dry skin. 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 Takes neurontin 100 mg twice daily; ultracet 2 tabs every 6 hours as needed for pain.   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 Arthritis    "hands, ankles, knees" (11/10/2014)  . Bladder spasms   . CAD (coronary artery disease)   . CAD in native artery 09/15/2015  . CHF (congestive heart failure) ([place])   . Chronic indwelling Foley catheter   . Chronic lower back pain   . Depression   . Diabetic diarrhea ([facility])   . Diabetic neuropathy, painful ([place])   . DVT (deep venous thrombosis) (HCC) 1980's   LLE  . GERD (gastroesophageal reflux disease)   . Hyperlipidemia   . Hypertension   . Neurogenic bladder   . Obese   . OSA (obstructive sleep apnea)    "they wanted me to  wear a mask; I couldn't" (11/10/2014)  . Pneumonia 1999  . PONV (postoperative nausea and vomiting)   . Stroke [facility]) 10/2011; 06/2014   right hand numbness/notes 10/20/2011, pt not sure he had stroke in 2013; "right hand weaker and right face not quite right since" (11/10/2014)  . Type II diabetes mellitus ([place])     Past Surgical History:  Procedure Laterality Date  . CARDIAC CATHETERIZATION N/A 05/02/2015   Procedure: Left Heart Cath and Coronary Angiography;  Surgeon: Lorretta Harp, MD;  Location: [facility] CV LAB;  Service: Cardiovascular;  Laterality: N/A;  . CARDIAC CATHETERIZATION N/A 05/04/2015   Procedure: Left Heart Cath and  Coronary Angiography;  Surgeon: Wellington Hampshire, MD;  Location: [facility] CV LAB;  Service: Cardiovascular;  Laterality: N/A;  . CARPAL TUNNEL RELEASE Bilateral ~ 2003-2004  . CHOLECYSTECTOMY OPEN  1970's?  Mar[facility] Kitchen GASTROPLASTY    . JOINT REPLACEMENT    . LEFT HEART CATHETERIZATION WITH CORONARY ANGIOGRAM N/A 10/24/2011   Procedure: LEFT HEART CATHETERIZATION WITH CORONARY ANGIOGRAM;  Surgeon: Candee Furbish, MD;  Location[facility] CATH LAB;  Service: Cardiovascular;  Laterality: N/A;  right radial artery approach  . TOTAL KNEE ARTHROPLASTY Right 1990's    Patient Care Team: Lavone Orn, MD as PCP - General (Internal Medicine)  Social History   Social History  . Marital status: Widowed    Spouse name: N/A  . Number of children: N/A  . Years of education: N/A   Occupational History  . Retired    Social History Main Topics  . Smoking status: Former Smoker    Packs/day: 2.00    Years: 20.00    Types: Cigarettes    Quit date: 09/10/1976  . Smokeless tobacco: Never Used  . Alcohol use No     Comment: FORMER ALCOHOLIC; "sober since 73/41/9379"  . Drug use: No  . Sexual activity: No   Other Topics Concern  . Not on file   Social History Narrative   Lives alone.  No close family nearby.       reports that he quit smoking about 39 years ago. His smoking use included Cigarettes. He has a 40.00 pack-year smoking history. He has never used smokeless tobacco. He reports that he does not drink alcohol or use drugs.  Family History  Problem Relation Age of Onset  . Diabetes type I Father   . Hypertension Mother   . Cancer Brother      Testicular Cancer  . Cancer Brother     Leukemia   Family Status  Relation Status  . Father Deceased   DM/DVT  . Mother Deceased   old age  . Brother Alive  . Brother Alive  . Maternal Grandmother Deceased  . Maternal Grandfather Deceased  . Paternal Grandmother Deceased  . Paternal Grandfather Deceased  . Brother   . Brother     Immunization  History  Administered Date(s) Administered  . Influenza Split 10/12/2011, 10/13/2011  . Influenza,inj,Quad PF,36+ Mos 05/06/2015  . Influenza-Unspecified 06/10/2014  . PPD Test 10/13/2015, 05/25/2016    Allergies  Allergen Reactions  . Ace Inhibitors Swelling    Pt tolerates lisinopril  . Lipitor [Atorvastatin Calcium] Swelling  . Metformin And Related Swelling  . Cefadroxil Hives  . Cephalexin Hives  . Morphine And Related Other (See Comments)    Sweating, feels like is "in rocky boat."  . Robaxin [Methocarbamol] Other (See Comments)    Feels like he is shaky    Medications: Patient's Medications  New Prescriptions   No medications on file  Previous Medications   ALUMINUM-MAGNESIUM HYDROXIDE-SIMETHICONE (MAALOX) 200-200-20 MG/5ML SUSP    Take 30 mLs by mouth every 6 (six) hours as needed.   AMLODIPINE (NORVASC) 5 MG TABLET    Take 5 mg by mouth daily.   ASPIRIN EC 81 MG TABLET    Take 81 mg by mouth daily. 0900   ESCITALOPRAM (LEXAPRO) 10 MG TABLET    Take 10 mg by mouth daily.    FEEDING SUPPLEMENT, GLUCERNA SHAKE, (GLUCERNA SHAKE) LIQD    Take 237 mLs by mouth 3 (three) times daily between meals.   FOLIC ACID (FOLVITE) 1 MG TABLET    Take 1 mg by mouth daily.   GABAPENTIN (NEURONTIN) 100 MG CAPSULE    Take 1 capsule (100 mg total) by mouth 2 (two) times daily.   GUAIFENESIN (MUCINEX) 600 MG 12 HR TABLET    Take 1 tablet (600 mg total) by mouth 2 (two) times daily.   HYDROXYZINE (ATARAX/VISTARIL) 25 MG TABLET    Take 25 mg by mouth 4 (four) times daily.    INSULIN ASPART (NOVOLOG FLEXPEN) 100 UNIT/ML FLEXPEN    0-20 Units, Subcutaneous, 3 times daily with meals Correction coverage: CBG < 70: implement hypoglycemia protocol CBG 70 - 120: 0 units CBG 121 - 150: 3 units CBG 151 - 200: 4 units CBG 201 - 250: 7 units CBG 251 - 300: 11 units CBG 301 - 350: 15 units CBG 351 - 400: 20 units CBG > 400: call MD   INSULIN ASPART (NOVOLOG) 100 UNIT/ML INJECTION    Inject 20 Units  into the skin 3 (three) times daily with meals.   INSULIN GLARGINE (LANTUS) 100 UNIT/ML INJECTION    Inject 0.3 mLs (30 Units total) into the skin daily.   INSULIN GLARGINE (LANTUS) 100 UNIT/ML INJECTION    Inject 0.7 mLs (70 Units total) into the skin at bedtime.   IPRATROPIUM-ALBUTEROL (DUONEB) 0.5-2.5 (3) MG/3ML SOLN    Take 3 mLs by nebulization every 4 (four) hours as needed.   MAGNESIUM OXIDE (MAG-OX) 400 MG TABLET    Take 400 mg by mouth daily. 0900   METOPROLOL TARTRATE (LOPRESSOR) 25 MG TABLET    0.5 tablet by mouth two times a day 0900 , 2100   MULTIPLE VITAMINS-MINERALS (DECUBI-VITE) CAPS    Take 1 capsule by mouth daily at 3 pm.   NITROGLYCERIN (NITROSTAT) 0.4 MG SL TABLET    Place 0.4 mg under the tongue every 5 (five) minutes as needed for chest pain.   OMEPRAZOLE (PRILOSEC) 40 MG CAPSULE    Take 40 mg by mouth daily. 0600   POTASSIUM CHLORIDE (KLOR-CON) 20 MEQ PACKET    Take 40 mEq by mouth daily. 0800   PREDNISONE (DELTASONE) 20 MG TABLET    Take 40 mg by mouth daily with breakfast.   ROPINIROLE (REQUIP) 0.5 MG TABLET    Take 0.5 mg by mouth at bedtime. 2100   SENNOSIDES-DOCUSATE SODIUM (SENOKOT-S) 8.6-50 MG TABLET    Take 2 tablets by mouth at bedtime. 2100   TORSEMIDE (DEMADEX) 20 MG TABLET    Take 20 mg by mouth 2 (two) times daily.   TRAMADOL-ACETAMINOPHEN (ULTRACET) 37.5-325 MG TABLET    Take 1-2 tablets by mouth every 6 (six) hours as needed for moderate pain.   TRAZODONE (DESYREL) 50 MG TABLET    Take 25 mg by mouth at bedtime.   VANCOMYCIN (VANCOCIN) 750-5 MG/150ML-% SOLN    Inject 150 mLs (  750 mg total) into the vein every 12 (twelve) hours. 3 weeks from 9/18   VITAMIN C (ASCORBIC ACID) 500 MG TABLET    Take 500 mg by mouth 2 (two) times daily. 0900 and 2100  Modified Medications   No medications on file  Discontinued Medications   MULTIPLE VITAMIN (MULTIVITAMIN WITH MINERALS) TABS TABLET    Take 1 tablet by mouth daily.   SILVER SULFADIAZINE (SILVADENE) 1 % CREAM     Apply topically daily. Topical, Daily Apply thick layer of Silvadene to bilat legs and feet Q day, then cover with ABD pads and kerlex, beginning just behind toes to below knees    Review of Systems  Respiratory: Positive for shortness of breath.   Musculoskeletal: Positive for arthralgias.  All other systems reviewed and are negative.   Vitals:   06/04/16 0959  BP: (!) 143/87  Pulse: 68  Resp: 18  Temp: 97.6 F (36.4 C)  TempSrc: Oral  SpO2: 96%  Weight: (!) 347 lb (157.4 kg)   Body mass index is 45.78 kg/m.  Physical Exam  Constitutional: He is oriented to person, place, and time. He appears well-developed and well-nourished.  Sitting up in bed in NAD. No conversational dyspnea  HENT:  Mouth/Throat: Oropharynx is clear and moist. No oropharyngeal exudate.  No thrush  Eyes: Pupils are equal, round, and reactive to light. No scleral icterus.  Neck: Neck supple. Carotid bruit is not present. No thyromegaly present.  Cardiovascular: Normal rate, regular rhythm, normal heart sounds and intact distal pulses.  Exam reveals no gallop and no friction rub.   No murmur heard. No LE edema b/l. No calf TTP. Right arm PICC line intact with no redness, or purulent d/c  Pulmonary/Chest: Effort normal. No respiratory distress. He has no wheezes. He has rales. He exhibits no tenderness.  No rhonchi. Reduced BS at base b/l  Abdominal: Soft. Bowel sounds are normal. He exhibits no distension, no abdominal bruit, no pulsatile midline mass and no mass. There is no hepatomegaly. There is tenderness (lower quadrant). There is no rebound and no guarding.  Genitourinary:  Genitourinary Comments: Foley DTG clear yellow urine  Musculoskeletal: He exhibits edema.  Lymphadenopathy:    He has no cervical adenopathy.  Neurological: He is alert and oriented to person, place, and time.  Skin: Skin is warm and dry. Rash (multiple bullous pemphigoid areas without secondary signs of infection) noted.    Psychiatric: He has a normal mood and affect. His behavior is normal. Thought content normal.     Labs reviewed: Admission on 05/27/2016, Discharged on 06/02/2016  No results displayed because visit has over 200 results.  CBC    Component Value Date/Time   WBC 14.0 (H) 05/30/2016 1008   RBC 3.90 (L) 05/30/2016 1008   HGB 11.5 (L) 05/30/2016 1008   HCT 35.5 (L) 05/30/2016 1008   PLT 222 05/30/2016 1008   MCV 91.0 05/30/2016 1008   MCH 29.5 05/30/2016 1008   MCHC 32.4 05/30/2016 1008   RDW 13.9 05/30/2016 1008   LYMPHSABS 1.7 05/05/2016 1425   MONOABS 1.3 (H) 05/05/2016 1425   EOSABS 3.0 (H) 05/05/2016 1425   BASOSABS 0.1 05/05/2016 1425   CMP Latest Ref Rng & Units 06/01/2016 05/31/2016 05/30/2016  Glucose 65 - 99 mg/dL 258(H) 339(H) 406(H)  BUN 6 - 20 mg/dL 21(H) 19 -  Creatinine 0.61 - 1.24 mg/dL 0.96 0.91 -  Sodium 135 - 145 mmol/L 129(L) 129(L) -  Potassium 3.5 - 5.1 mmol/L 4.9 4.4 -  Chloride 101 - 111 mmol/L 93(L) 98(L) -  CO2 22 - 32 mmol/L 28 24 -  Calcium 8.9 - 10.3 mg/dL 8.4(L) 8.0(L) -  Total Protein 6.5 - 8.1 g/dL - - -  Total Bilirubin 0.3 - 1.2 mg/dL - - -  Alkaline Phos 38 - 126 U/L - - -  AST 15 - 41 U/L - - -  ALT 17 - 63 U/L - - -   Lab Results  Component Value Date   HGBA1C 11.7 (H) 05/27/2016     Nursing Home on 05/11/2016  Component Date Value Ref Range Status  . Hemoglobin 05/09/2016 11.6* 13.5 - 17.5 g/dL Final  . HCT 05/09/2016 36* 41 - 53 % Final  . Platelets 05/09/2016 364  150 - 399 K/L Final  . WBC 05/09/2016 15.7  10^3/mL Final  . Glucose 05/09/2016 264  mg/dL Final  . BUN 05/09/2016 22* 4 - 21 mg/dL Final  . Creatinine 05/09/2016 0.8  0.6 - 1.3 mg/dL Final  . Potassium 05/09/2016 4.5  3.4 - 5.3 mmol/L Final  . Sodium 05/09/2016 137  137 - 147 mmol/L Final  Nursing Home on 05/07/2016  Component Date Value Ref Range Status  . Microalb, Ur 03/26/2016 28.5   Final  . Hemoglobin A1C 03/26/2016 8.7   Final  Admission on 05/05/2016,  Discharged on 05/05/2016  Component Date Value Ref Range Status  . WBC 05/05/2016 17.5* 4.0 - 10.5 K/uL Final  . RBC 05/05/2016 4.27  4.22 - 5.81 MIL/uL Final  . Hemoglobin 05/05/2016 12.9* 13.0 - 17.0 g/dL Final  . HCT 05/05/2016 37.6* 39.0 - 52.0 % Final  . MCV 05/05/2016 88.1  78.0 - 100.0 fL Final  . MCH 05/05/2016 30.2  26.0 - 34.0 pg Final  . MCHC 05/05/2016 34.3  30.0 - 36.0 g/dL Final  . RDW 05/05/2016 13.3  11.5 - 15.5 % Final  . Platelets 05/05/2016 365  150 - 400 K/uL Final  . Neutrophils Relative % 05/05/2016 65  % Final  . Neutro Abs 05/05/2016 11.4* 1.7 - 7.7 K/uL Final  . Lymphocytes Relative 05/05/2016 10  % Final  . Lymphs Abs 05/05/2016 1.7  0.7 - 4.0 K/uL Final  . Monocytes Relative 05/05/2016 7  % Final  . Monocytes Absolute 05/05/2016 1.3* 0.1 - 1.0 K/uL Final  . Eosinophils Relative 05/05/2016 17  % Final  . Eosinophils Absolute 05/05/2016 3.0* 0.0 - 0.7 K/uL Final  . Basophils Relative 05/05/2016 1  % Final  . Basophils Absolute 05/05/2016 0.1  0.0 - 0.1 K/uL Final  . Sodium 05/05/2016 134* 135 - 145 mmol/L Final  . Potassium 05/05/2016 3.8  3.5 - 5.1 mmol/L Final  . Chloride 05/05/2016 98* 101 - 111 mmol/L Final  . CO2 05/05/2016 29  22 - 32 mmol/L Final  . Glucose, Bld 05/05/2016 105* 65 - 99 mg/dL Final  . BUN 05/05/2016 13  6 - 20 mg/dL Final  . Creatinine, Ser 05/05/2016 0.69  0.61 - 1.24 mg/dL Final  . Calcium 05/05/2016 8.6* 8.9 - 10.3 mg/dL Final  . GFR calc non Af Amer 05/05/2016 >60  >60 mL/min Final  . GFR calc Af Amer 05/05/2016 >60  >60 mL/min Final   Comment: (NOTE) The eGFR has been calculated using the CKD EPI equation. This calculation has not been validated in all clinical situations. eGFR's persistently <60 mL/min signify possible Chronic Kidney Disease.   . Anion gap 05/05/2016 7  5 - 15 Final  . Sed Rate 05/05/2016 60* 0 -  16 mm/hr Final  Nursing Home on 03/08/2016  Component Date Value Ref Range Status  . HM Diabetic Foot Exam  01/09/2016 completed by [facility]   Final    Ct Chest W Contrast  Result Date: 05/28/2016 CLINICAL DATA:  70 year old male inpatient with a reported history of right supraclavicular lymphadenopathy and tenderness. Bacteremia. EXAM: CT CHEST WITH CONTRAST TECHNIQUE: Multidetector CT imaging of the chest was performed during intravenous contrast administration. CONTRAST:  32m ISOVUE-300 IOPAMIDOL (ISOVUE-300) INJECTION 61% COMPARISON:  Chest radiograph from earlier today. 09/07/2015 chest CT. FINDINGS: Cardiovascular: New mild cardiomegaly. Trace pericardial effusion/ thickening. Left anterior descending, left circumflex and right coronary atherosclerosis. Atherosclerotic nonaneurysmal thoracic aorta. Normal caliber pulmonary arteries. No central pulmonary emboli. Mediastinum/Nodes: No discrete thyroid nodules. Unremarkable esophagus. No axillary adenopathy. New mild right paratracheal adenopathy measuring up to 1.4 cm (series 3/ image 47). New mild subcarinal adenopathy measuring up to 1.4 cm (series 3/image 60). No additional pathologically enlarged mediastinal or hilar nodes. Lungs/Pleura: No pneumothorax. Small right pleural effusion. No left pleural effusion. There is near complete consolidation of the right lower lobe, with some associated volume loss in the right lower lobe, and focal mild cavitary change in the basilar right lower lobe. Mild compressive atelectasis in the dependent right upper and right middle lobes. Mild hypoventilatory change in the dependent left lower lobe. No significant pulmonary nodules in the aerated portions of the lungs. Upper abdomen: Cholecystectomy. Surgical sutures and clips are noted in the region of the proximal stomach, which causes streak artifact and limits visualization in this region. Musculoskeletal: No aggressive appearing focal osseous lesions. Stable small sclerotic lesion in the T5 vertebral body, probably a benign bone island. Moderate thoracic spondylosis.  Moderate symmetric gynecomastia appears stable. IMPRESSION: 1. Chest CT findings are most suggestive of a cavitary right lower lobe pneumonia. 2. Small parapneumonic right pleural effusion with mild compressive atelectasis in the dependent right lung. 3. New mild right paratracheal and subcarinal lymphadenopathy, nonspecific, possibly reactive. 4. A follow-up chest CT with IV contrast is recommended in 3 months to document resolution of these findings. 5. New mild cardiomegaly.  Trace pericardial effusion/thickening. 6. Aortic atherosclerosis.  Three-vessel coronary atherosclerosis. Electronically Signed   By: JIlona SorrelM.D.   On: 05/28/2016 17:21   Dg Chest Port 1 View  Result Date: 06/01/2016 CLINICAL DATA:  Shortness of Breath EXAM: PORTABLE CHEST 1 VIEW COMPARISON:  05/28/2016 FINDINGS: Right-sided pleural effusion and underlying lower lobe consolidation is again noted. The left lung remains clear. Cardiac shadow is mildly enlarged but accentuated by the portable technique. No acute bony abnormality is seen. IMPRESSION: No significant interval change in right effusion and lower lobe consolidation. Electronically Signed   By: MInez CatalinaM.D.   On: 06/01/2016 12:19   Dg Chest Port 1 View  Result Date: 05/28/2016 CLINICAL DATA:  Sepsis EXAM: PORTABLE CHEST 1 VIEW COMPARISON:  05/27/2016 FINDINGS: Cardiomegaly with vascular congestion. Bibasilar atelectasis, right greater than left. Mild interstitial prominence throughout the lungs could reflect mild interstitial edema. This is increased slightly since prior study. IMPRESSION: Low lung volumes with bibasilar atelectasis. Cardiomegaly with vascular congestion and possible mild interstitial edema. Electronically Signed   By: KRolm BaptiseM.D.   On: 05/28/2016 07:41   Dg Chest Portable 1 View  Result Date: 05/27/2016 CLINICAL DATA:  Right side chest pain and SOB x this morning. Hx of HTN, diabetes, CAD, CHF. EXAM: PORTABLE CHEST 1 VIEW COMPARISON:   01/11/2016 FINDINGS: Normal cardiac silhouette. There are low lung  volumes with bibasilar atelectasis not changed. No pneumothorax. IMPRESSION: Low lung volumes and bibasilar atelectasis.  No change. Electronically Signed   By: Suzy Bouchard M.D.   On: 05/27/2016 08:37     Assessment/Plan   ICD-9-CM ICD-10-CM   1. Dyspnea related to #2 786.09 R06.00   2. HCAP (healthcare-associated pneumonia) 12 J18.9   3. MRSA bacteremia 790.7 R78.81    041.12 B95.62   4. Bullous pemphigus 694.4 L10.9   5. Uncontrolled type II diabetes with peripheral autonomic neuropathy (HCC) 250.62 E11.43    337.1 E11.65   6. Chronic diastolic CHF (congestive heart failure) (H[facility]) 428.32 I50.32    428.0      Check weekly cbc and bmp  vanco trough per pharmacy  May d/c PICC line at completion of abx  Start [place] O2 at 2L/min for SOB  F/u with ID as scheduled  Hold MTX while taking abx  Cont other meds as ordered  Wound care as ordered  F/u wit specialists as scheduled  PT/OT/St as indicated  GOAL: long term care. Communicated with pt and nursing.  Will follow  Jaiden Dinkins S. Perlie Gold  [facility] and [facility] [address] [place], [place] [address] [phone] Cell (Monday-Friday 8 AM - 5 PM) [phone] After 5 PM and follow prompts

## 2016-06-06 ENCOUNTER — Non-Acute Institutional Stay (SKILLED_NURSING_FACILITY): Payer: Medicare Other | Admitting: Adult Health

## 2016-06-06 ENCOUNTER — Other Ambulatory Visit: Payer: Self-pay | Admitting: *Deleted

## 2016-06-06 ENCOUNTER — Encounter: Payer: Self-pay | Admitting: Adult Health

## 2016-06-06 DIAGNOSIS — J189 Pneumonia, unspecified organism: Secondary | ICD-10-CM

## 2016-06-06 LAB — ANA: ANA Ab, IFA: NEGATIVE

## 2016-06-06 MED ORDER — TRAMADOL-ACETAMINOPHEN 37.5-325 MG PO TABS: 1.0000 | ORAL_TABLET | Freq: Four times a day (QID) | ORAL | 0 refills | Status: DC | PRN

## 2016-06-06 NOTE — Progress Notes (Signed)
Patient ID: [name], male   DOB: 07-08-1946, 70 y.o.   MRN: [id number]   Location:   [place] Room Number: 216-B Place of Service:  SNF (31)   CODE STATUS: DNR  Allergies  Allergen Reactions  . Ace Inhibitors Swelling    Pt tolerates lisinopril  . Lipitor [Atorvastatin Calcium] Swelling  . Metformin And Related Swelling  . Cefadroxil Hives  . Cephalexin Hives  . Morphine And Related Other (See Comments)    Sweating, feels like is "in rocky boat."  . Robaxin [Methocarbamol] Other (See Comments)    Feels like he is shaky    Chief Complaint  Patient presents with  . Acute Visit    Follow up chest xray    HPI:  Yesterday he had increased shortness of breath; with decreased 02 sats. He did have an x-ray performed which demonstrates right lung infiltrate. He has recently been hospitalized for pneumonia. There are no reports of fevers present. I have spoken with Dr. Eulas Post regarding his status; and is in agreement with starting unasyn for 5 more days.   Past Medical History:  Diagnosis Date  . Arthritis    "hands, ankles, knees" (11/10/2014)  . Bladder spasms   . CAD (coronary artery disease)   . CAD in native artery 09/15/2015  . CHF (congestive heart failure) ([place])   . Chronic indwelling Foley catheter   . Chronic lower back pain   . Depression   . Diabetic diarrhea ([place])   . Diabetic neuropathy, painful ([place])   . DVT (deep venous thrombosis) (HCC) 1980's   LLE  . GERD (gastroesophageal reflux disease)   . Hyperlipidemia   . Hypertension   . Neurogenic bladder   . Obese   . OSA (obstructive sleep apnea)    "they wanted me to wear a mask; I couldn't" (11/10/2014)  . Pneumonia 1999  . PONV (postoperative nausea and vomiting)   . Stroke [facility]) 10/2011; 06/2014   right hand numbness/notes 10/20/2011, pt not sure he had stroke in 2013; "right hand weaker and right face not quite right since" (11/10/2014)  . Type II diabetes mellitus ([place])     Past Surgical  History:  Procedure Laterality Date  . CARDIAC CATHETERIZATION N/A 05/02/2015   Procedure: Left Heart Cath and Coronary Angiography;  Surgeon: Lorretta Harp, MD;  Location: [place] CV LAB;  Service: Cardiovascular;  Laterality: N/A;  . CARDIAC CATHETERIZATION N/A 05/04/2015   Procedure: Left Heart Cath and Coronary Angiography;  Surgeon: Wellington Hampshire, MD;  Location: [place] CV LAB;  Service: Cardiovascular;  Laterality: N/A;  . CARPAL TUNNEL RELEASE Bilateral ~ 2003-2004  . CHOLECYSTECTOMY OPEN  1970's?  Marland Kitchen GASTROPLASTY    . JOINT REPLACEMENT    . LEFT HEART CATHETERIZATION WITH CORONARY ANGIOGRAM N/A 10/24/2011   Procedure: LEFT HEART CATHETERIZATION WITH CORONARY ANGIOGRAM;  Surgeon: Candee Furbish, MD;  Location: [facility] CATH LAB;  Service: Cardiovascular;  Laterality: N/A;  right radial artery approach  . TOTAL KNEE ARTHROPLASTY Right 1990's    Social History   Social History  . Marital status: Widowed    Spouse name: N/A  . Number of children: N/A  . Years of education: N/A   Occupational History  . Retired    Social History Main Topics  . Smoking status: Former Smoker    Packs/day: 2.00    Years: 20.00    Types: Cigarettes    Quit date: 09/10/1976  . Smokeless tobacco: Never Used  . Alcohol  use No     Comment: FORMER ALCOHOLIC; "sober since 36/14/4315"  . Drug use: No  . Sexual activity: No   Other Topics Concern  . Not on file   Social History Narrative   Lives alone.  No close family nearby.     Family History  Problem Relation Age of Onset  . Diabetes type I Father   . Hypertension Mother   . Cancer Brother      Testicular Cancer  . Cancer Brother     Leukemia      VITAL SIGNS BP 130/70   Pulse 71   Temp 98.1 F (36.7 C) (Oral)   Resp 20   Ht _0  (1.854 m)   Wt (!) 347 lb (157.4 kg)   SpO2 95%   BMI 45.78 kg/m   Patient's Medications  New Prescriptions   No medications on file  Previous Medications   ALUMINUM-MAGNESIUM  HYDROXIDE-SIMETHICONE (MAALOX) 400-867-61 MG/5ML SUSP    Take 30 mLs by mouth every 6 (six) hours as needed.   AMLODIPINE (NORVASC) 5 MG TABLET    Take 5 mg by mouth daily.   ASPIRIN EC 81 MG TABLET    Take 81 mg by mouth daily. 0900   ESCITALOPRAM (LEXAPRO) 10 MG TABLET    Take 10 mg by mouth daily.    FEEDING SUPPLEMENT, GLUCERNA SHAKE, (GLUCERNA SHAKE) LIQD    Take 237 mLs by mouth 3 (three) times daily between meals.   FOLIC ACID (FOLVITE) 1 MG TABLET    Take 1 mg by mouth daily.   GABAPENTIN (NEURONTIN) 100 MG CAPSULE    Take 1 capsule (100 mg total) by mouth 2 (two) times daily.   GUAIFENESIN (MUCINEX) 600 MG 12 HR TABLET    Take 1 tablet (600 mg total) by mouth 2 (two) times daily.   HYDROXYZINE (ATARAX/VISTARIL) 25 MG TABLET    Take 25 mg by mouth 4 (four) times daily.    INSULIN ASPART (NOVOLOG FLEXPEN) 100 UNIT/ML FLEXPEN    0-20 Units, Subcutaneous, 3 times daily with meals Correction coverage: CBG < 70: implement hypoglycemia protocol CBG 70 - 120: 0 units CBG 121 - 150: 3 units CBG 151 - 200: 4 units CBG 201 - 250: 7 units CBG 251 - 300: 11 units CBG 301 - 350: 15 units CBG 351 - 400: 20 units CBG > 400: call MD   INSULIN ASPART (NOVOLOG) 100 UNIT/ML INJECTION    Inject 20 Units into the skin 3 (three) times daily with meals.   INSULIN GLARGINE (LANTUS) 100 UNIT/ML INJECTION    Inject 0.3 mLs (30 Units total) into the skin daily.   INSULIN GLARGINE (LANTUS) 100 UNIT/ML INJECTION    Inject 0.7 mLs (70 Units total) into the skin at bedtime.   IPRATROPIUM-ALBUTEROL (DUONEB) 0.5-2.5 (3) MG/3ML SOLN    Take 3 mLs by nebulization every 4 (four) hours as needed.   MAGNESIUM OXIDE (MAG-OX) 400 MG TABLET    Take 400 mg by mouth daily. 0900   METOPROLOL TARTRATE (LOPRESSOR) 25 MG TABLET    0.5 tablet by mouth two times a day 0900 , 2100   MULTIPLE VITAMINS-MINERALS (DECUBI-VITE) CAPS    Take 1 capsule by mouth daily at 3 pm.   NITROGLYCERIN (NITROSTAT) 0.4 MG SL TABLET    Place 0.4 mg  under the tongue every 5 (five) minutes as needed for chest pain.   OMEPRAZOLE (PRILOSEC) 40 MG CAPSULE    Take 40 mg by mouth daily. 0600  POTASSIUM CHLORIDE (KLOR-CON) 20 MEQ PACKET    Take 40 mEq by mouth daily. 0800   PREDNISONE (DELTASONE) 20 MG TABLET    Take 40 mg by mouth daily with breakfast.   ROPINIROLE (REQUIP) 0.5 MG TABLET    Take 0.5 mg by mouth at bedtime. 2100   SENNOSIDES-DOCUSATE SODIUM (SENOKOT-S) 8.6-50 MG TABLET    Take 2 tablets by mouth at bedtime. 2100   TORSEMIDE (DEMADEX) 20 MG TABLET    Take 20 mg by mouth 2 (two) times daily.   TRAMADOL-ACETAMINOPHEN (ULTRACET) 37.5-325 MG TABLET    Take 1-2 tablets by mouth every 6 (six) hours as needed for moderate pain.   TRAZODONE (DESYREL) 50 MG TABLET    Take 25 mg by mouth at bedtime.   VANCOMYCIN (VANCOCIN) 750-5 MG/150ML-% SOLN    Inject 150 mLs (750 mg total) into the vein every 12 (twelve) hours. 3 weeks from 9/18   VITAMIN C (ASCORBIC ACID) 500 MG TABLET    Take 500 mg by mouth 2 (two) times daily. 0900 and 2100  Modified Medications   No medications on file  Discontinued Medications   No medications on file     SIGNIFICANT DIAGNOSTIC EXAMS  10-12-15: chest x-ray: Enlarged cardiac silhouette. Pulmonary vascular congestion.  10-18-15: EEG: This is a normal EEG for the patients stated age.  There were no focal, hemispheric or lateralizing features.  No epileptiform activity was recorded.  A normal EEG does not exclude the diagnosis of a seizure disorder and if seizure remains high on the list of differential diagnosis, an ambulatory EEG may be of value.  Clinical correlation is required.  01-11-16: chest x-ray: Minimal left base atelectasis.  Otherwise, normal exam.  01-11-16: ct of head: No acute intracranial abnormality. Stable mild cerebral atrophy and chronic white matter disease. No definite acute cortical infarction.  01-11-16: scrotal ultrasound: 1. Left epididymo-orchitis. 2. Microlithiasis involving the left  testicle. 3. Three adjacent complex cysts/spermatoceles involving the left epididymal head.  05-27-16: chest x-ray; Low lung volumes and bibasilar atelectasis. No change.  05-28-16; ct of chest: 1. Chest CT findings are most suggestive of a cavitary right lower lobe pneumonia. 2. Small parapneumonic right pleural effusion with mild compressive atelectasis in the dependent right lung. 3. New mild right paratracheal and subcarinal lymphadenopathy, nonspecific, possibly reactive. 4. A follow-up chest CT with IV contrast is recommended in 3 months to document resolution of these findings. 5. New mild cardiomegaly.  Trace pericardial effusion/thickening. 6. Aortic atherosclerosis.  Three-vessel coronary atherosclerosis.   05-28-16: TEE: - Left ventricle: The cavity size was normal. There was severe   concentric hypertrophy. Systolic function was normal. Wall motion   was normal; there were no regional wall motion abnormalities.   There was fusion of early and atrial contributions to ventricular   filling. The study is not technically sufficient to allow   evaluation of LV diastolic function. - Aortic valve: Valve mobility was restricted. There was mild to   moderate stenosis. Mean gradient (S): 15 mm Hg. Valve area (VTI):   0.95 cm^2. Valve area (Vmax): 1.05 cm^2. Valve area (Vmean): 1   cm^2. - Mitral valve: Moderately calcified annulus. - Left atrium: The atrium was moderately dilated.  06-01-16: chest x-ray; No significant interval change in right effusion and lower lobe consolidation.   06-05-16: chest x-ray: right lung infiltrate   LABS REVIEWED:   09-10-15: hgb a1c 10.4 09-15-15; tsh 2.954 10-12-15; wbc 5.9; hgb 11.8; hct 36.1; mcv 93.5; pt 195; glucose 187;  bun 8; creat 0.78; k+ 5.0; na++136 11-03-15: wbc 9.2; hgb 12.7; hct 39.7; mcv 89.3; plt 267; glucose 105; bun 17.2; creat 0.61; k+ 4.2; na++135  12-29-15: chol 180; ldl 115; trig 66; hdl 52; hgb a1c 8.8  01-11-16: wbc 19.6; hgb  13.6; hct 40.1; mcv 87.6; plt 232; glucose 255; bun 15; creat 0.67; k+ 4.7; na++132; liver normal albumin 2.9; blood culture: no growth; urine culture: e-coli-esbl; klebsiella pneumoniae  01-15-16: wbc 7.4; hgb 11.6; hct 35.7; mcv 89.9; plt 242; glucose 159; bun 5; creat 0.53; k+ 4.1; na++135  05-16-16: ANA: neg  05-27-16: wbc 29.3; hgb 13.6; hct 40.1; mcv 88.9 plt 227; glucose 587; bun 21; creat 1.12; k+ 4.9; na++ 127; alk phos 138; albumin 2.8; BNP 141.6; hgb a1c 11.7; urine culture; multiple bacteria; blood culture: MRSA; d-dimer: 0.38; cortisol 40.5 05-29-16: wbc 17.4; hgb 11.2; hct 34.7; mcv 90.6 ;plt 214; glucose 339; bun 26; creat 1.10; k+ 4.2; na++ 131; alk phos 209; ast 45; albumin 1.9; urine culture: MRSA  06-05-16: wbc 14.6; hgb 10.2; hct 32.9; mcv 89.4 plt 392; glucose 119; bun 14.2; creat 0.79; k+ 4.3; na++ 136; alk phos 198; albumin 2.2      Review of Systems  Constitutional: Negative for malaise/fatigue.  Respiratory: Negative for cough has shortness of breath   Cardiovascular: Positive for leg swelling. Negative for chest pain and palpitations.  Gastrointestinal: Negative for heartburn, abdominal pain and constipation.  Musculoskeletal: right thigh pain   Skin: sores are improving.   Neurological: Negative for dizziness.  Psychiatric/Behavioral: The patient is not nervous/anxious.        Physical Exam  Constitutional: He is oriented to person, place, and time. No distress.  Obese   Eyes: Conjunctivae are normal.  Neck: Neck supple. No JVD present. No thyromegaly present.  Cardiovascular: Normal rate, regular rhythm and intact distal pulses.   Respiratory: Effort normal breath sounds diminished.  GI: Soft. Bowel sounds are normal. He exhibits no distension. There is no tenderness.  Genitourinary:  Has foley   Musculoskeletal: He exhibits edema.  Able to move all extremities Trace bilateral lower extremity edema     Lymphadenopathy:    He has no cervical adenopathy.    Neurological: alert  Skin: Skin is warm and dry. He is not diaphoretic. His skin is improving       ASSESSMENT/ PLAN:  1. Pneumonia: will begin unasyn 3 gm IV every 6 hours for 5 days; will begin duoneb every 6 hours for one week then every 6 hours as needed and will monitor his status.     Time spent with patient 30   minutes >50% time spent counseling; reviewing medical record; tests; labs; and developing future plan of care   MD is aware of resident's narcotic use and is in agreement with current plan of care. We will attempt to wean resident as appropriate.     Ok Edwards NP [facility]  Contact [phone] Monday through Friday 8am- 5pm  After hours call [phone]

## 2016-06-06 NOTE — Telephone Encounter (Signed)
[facility] #[phone] Fax:[phone]  

## 2016-06-06 NOTE — Progress Notes (Deleted)
Patient ID: [name], male   DOB: June 26, 1946, 70 y.o.   MRN: [id number]   Location:   [place] Room Number: 216-B Place of Service:  SNF (31)   CODE STATUS: Full Code  Allergies  Allergen Reactions  . Ace Inhibitors Swelling    Pt tolerates lisinopril  . Lipitor [Atorvastatin Calcium] Swelling  . Metformin And Related Swelling  . Cefadroxil Hives  . Cephalexin Hives  . Morphine And Related Other (See Comments)    Sweating, feels like is "in rocky boat."  . Robaxin [Methocarbamol] Other (See Comments)    Feels like he is shaky    Chief Complaint  Patient presents with  . Acute Visit    Follow up chest xray    HPI:    Past Medical History:  Diagnosis Date  . Arthritis    "hands, ankles, knees" (11/10/2014)  . Bladder spasms   . CAD (coronary artery disease)   . CAD in native artery 09/15/2015  . CHF (congestive heart failure) ([place])   . Chronic indwelling Foley catheter   . Chronic lower back pain   . Depression   . Diabetic diarrhea ([place])   . Diabetic neuropathy, painful ([place])   . DVT (deep venous thrombosis) (HCC) 1980's   LLE  . GERD (gastroesophageal reflux disease)   . Hyperlipidemia   . Hypertension   . Neurogenic bladder   . Obese   . OSA (obstructive sleep apnea)    "they wanted me to wear a mask; I couldn't" (11/10/2014)  . Pneumonia 1999  . PONV (postoperative nausea and vomiting)   . Stroke [facility]) 10/2011; 06/2014   right hand numbness/notes 10/20/2011, pt not sure he had stroke in 2013; "right hand weaker and right face not quite right since" (11/10/2014)  . Type II diabetes mellitus ([place])     Past Surgical History:  Procedure Laterality Date  . CARDIAC CATHETERIZATION N/A 05/02/2015   Procedure: Left Heart Cath and Coronary Angiography;  Surgeon: Lorretta Harp, MD;  Location: [place] CV LAB;  Service: Cardiovascular;  Laterality: N/A;  . CARDIAC CATHETERIZATION N/A 05/04/2015   Procedure: Left Heart Cath and Coronary  Angiography;  Surgeon: Wellington Hampshire, MD;  Location: [place] CV LAB;  Service: Cardiovascular;  Laterality: N/A;  . CARPAL TUNNEL RELEASE Bilateral ~ 2003-2004  . CHOLECYSTECTOMY OPEN  1970's?  Marland Kitchen GASTROPLASTY    . JOINT REPLACEMENT    . LEFT HEART CATHETERIZATION WITH CORONARY ANGIOGRAM N/A 10/24/2011   Procedure: LEFT HEART CATHETERIZATION WITH CORONARY ANGIOGRAM;  Surgeon: Candee Furbish, MD;  Location: [facility] CATH LAB;  Service: Cardiovascular;  Laterality: N/A;  right radial artery approach  . TOTAL KNEE ARTHROPLASTY Right 1990's    Social History   Social History  . Marital status: Widowed    Spouse name: N/A  . Number of children: N/A  . Years of education: N/A   Occupational History  . Retired    Social History Main Topics  . Smoking status: Former Smoker    Packs/day: 2.00    Years: 20.00    Types: Cigarettes    Quit date: 09/10/1976  . Smokeless tobacco: Never Used  . Alcohol use No     Comment: FORMER ALCOHOLIC; "sober since 29/79/8921"  . Drug use: No  . Sexual activity: No   Other Topics Concern  . Not on file   Social History Narrative   Lives alone.  No close family nearby.     Family History  Problem Relation  Age of Onset  . Diabetes type I Father   . Hypertension Mother   . Cancer Brother      Testicular Cancer  . Cancer Brother     Leukemia      VITAL SIGNS BP 130/70   Pulse 71   Temp 98.1 F (36.7 C) (Oral)   Resp 20   Ht 6\' 1"  (1.854 m)   Wt (!) 347 lb (157.4 kg)   SpO2 95%   BMI 45.78 kg/m   Patient's Medications  New Prescriptions   No medications on file  Previous Medications   ALUMINUM-MAGNESIUM HYDROXIDE-SIMETHICONE (MAALOX) 322-025-42 MG/5ML SUSP    Take 30 mLs by mouth every 6 (six) hours as needed.   AMLODIPINE (NORVASC) 5 MG TABLET    Take 5 mg by mouth daily.   ASPIRIN EC 81 MG TABLET    Take 81 mg by mouth daily. 0900   ESCITALOPRAM (LEXAPRO) 10 MG TABLET    Take 10 mg by mouth daily.    FEEDING SUPPLEMENT, GLUCERNA  SHAKE, (GLUCERNA SHAKE) LIQD    Take 237 mLs by mouth 3 (three) times daily between meals.   FOLIC ACID (FOLVITE) 1 MG TABLET    Take 1 mg by mouth daily.   GABAPENTIN (NEURONTIN) 100 MG CAPSULE    Take 1 capsule (100 mg total) by mouth 2 (two) times daily.   GUAIFENESIN (MUCINEX) 600 MG 12 HR TABLET    Take 1 tablet (600 mg total) by mouth 2 (two) times daily.   HYDROXYZINE (ATARAX/VISTARIL) 25 MG TABLET    Take 25 mg by mouth 4 (four) times daily.    INSULIN ASPART (NOVOLOG FLEXPEN) 100 UNIT/ML FLEXPEN    0-20 Units, Subcutaneous, 3 times daily with meals Correction coverage: CBG < 70: implement hypoglycemia protocol CBG 70 - 120: 0 units CBG 121 - 150: 3 units CBG 151 - 200: 4 units CBG 201 - 250: 7 units CBG 251 - 300: 11 units CBG 301 - 350: 15 units CBG 351 - 400: 20 units CBG > 400: call MD   INSULIN ASPART (NOVOLOG) 100 UNIT/ML INJECTION    Inject 20 Units into the skin 3 (three) times daily with meals.   INSULIN GLARGINE (LANTUS) 100 UNIT/ML INJECTION    Inject 0.3 mLs (30 Units total) into the skin daily.   INSULIN GLARGINE (LANTUS) 100 UNIT/ML INJECTION    Inject 0.7 mLs (70 Units total) into the skin at bedtime.   IPRATROPIUM-ALBUTEROL (DUONEB) 0.5-2.5 (3) MG/3ML SOLN    Take 3 mLs by nebulization every 4 (four) hours as needed.   MAGNESIUM OXIDE (MAG-OX) 400 MG TABLET    Take 400 mg by mouth daily. 0900   METOPROLOL TARTRATE (LOPRESSOR) 25 MG TABLET    0.5 tablet by mouth two times a day 0900 , 2100   MULTIPLE VITAMINS-MINERALS (DECUBI-VITE) CAPS    Take 1 capsule by mouth daily at 3 pm.   NITROGLYCERIN (NITROSTAT) 0.4 MG SL TABLET    Place 0.4 mg under the tongue every 5 (five) minutes as needed for chest pain.   OMEPRAZOLE (PRILOSEC) 40 MG CAPSULE    Take 40 mg by mouth daily. 0600   POTASSIUM CHLORIDE (KLOR-CON) 20 MEQ PACKET    Take 40 mEq by mouth daily. 0800   PREDNISONE (DELTASONE) 20 MG TABLET    Take 40 mg by mouth daily with breakfast.   ROPINIROLE (REQUIP) 0.5 MG  TABLET    Take 0.5 mg by mouth at bedtime. 2100  SENNOSIDES-DOCUSATE SODIUM (SENOKOT-S) 8.6-50 MG TABLET    Take 2 tablets by mouth at bedtime. 2100   TORSEMIDE (DEMADEX) 20 MG TABLET    Take 20 mg by mouth 2 (two) times daily.   TRAMADOL-ACETAMINOPHEN (ULTRACET) 37.5-325 MG TABLET    Take 1-2 tablets by mouth every 6 (six) hours as needed for moderate pain.   TRAZODONE (DESYREL) 50 MG TABLET    Take 25 mg by mouth at bedtime.   VANCOMYCIN (VANCOCIN) 750-5 MG/150ML-% SOLN    Inject 150 mLs (750 mg total) into the vein every 12 (twelve) hours. 3 weeks from 9/18   VITAMIN C (ASCORBIC ACID) 500 MG TABLET    Take 500 mg by mouth 2 (two) times daily. 0900 and 2100  Modified Medications   No medications on file  Discontinued Medications   No medications on file     SIGNIFICANT DIAGNOSTIC EXAMS  10-12-15: chest x-ray: Enlarged cardiac silhouette. Pulmonary vascular congestion.  10-18-15: EEG: This is a normal EEG for the patients stated age.  There were no focal, hemispheric or lateralizing features.  No epileptiform activity was recorded.  A normal EEG does not exclude the diagnosis of a seizure disorder and if seizure remains high on the list of differential diagnosis, an ambulatory EEG may be of value.  Clinical correlation is required.  01-11-16: chest x-ray: Minimal left base atelectasis.  Otherwise, normal exam.  01-11-16: ct of head: No acute intracranial abnormality. Stable mild cerebral atrophy and chronic white matter disease. No definite acute cortical infarction.  01-11-16: scrotal ultrasound: 1. Left epididymo-orchitis. 2. Microlithiasis involving the left testicle. 3. Three adjacent complex cysts/spermatoceles involving the left epididymal head.   LABS REVIEWED:   09-10-15: hgb a1c 10.4 09-15-15; tsh 2.954 10-12-15; wbc 5.9; hgb 11.8; hct 36.1; mcv 93.5; pt 195; glucose 187; bun 8; creat 0.78; k+ 5.0; na++136 11-03-15: wbc 9.2; hgb 12.7; hct 39.7; mcv 89.3; plt 267; glucose 105; bun 17.2;  creat 0.61; k+ 4.2; na++135  12-29-15: chol 180; ldl 115; trig 66; hdl 52; hgb a1c 8.8  01-11-16: wbc 19.6; hgb 13.6; hct 40.1; mcv 87.6; plt 232; glucose 255; bun 15; creat 0.67; k+ 4.7; na++132; liver normal albumin 2.9; blood culture: no growth; urine culture: e-coli-esbl; klebsiella pneumoniae  01-15-16: wbc 7.4; hgb 11.6; hct 35.7; mcv 89.9; plt 242; glucose 159; bun 5; creat 0.53; k+ 4.1; na++135       Review of Systems  Constitutional: Negative for malaise/fatigue.  Respiratory: Negative for cough and shortness of breath.   Cardiovascular: Positive for leg swelling. Negative for chest pain and palpitations.  Gastrointestinal: Negative for heartburn, abdominal pain and constipation.  Musculoskeletal: right thigh pain   Skin: sores are improving.   Neurological: Negative for dizziness.  Psychiatric/Behavioral: The patient is not nervous/anxious.        Physical Exam  Constitutional: He is oriented to person, place, and time. No distress.  Obese   Eyes: Conjunctivae are normal.  Neck: Neck supple. No JVD present. No thyromegaly present.  Cardiovascular: Normal rate, regular rhythm and intact distal pulses.   Respiratory: Effort normal and breath sounds normal. No respiratory distress. He has no wheezes.  GI: Soft. Bowel sounds are normal. He exhibits no distension. There is no tenderness.  Genitourinary:  Has foley   Musculoskeletal: He exhibits edema.  Able to move all extremities Trace bilateral lower extremity edema     Lymphadenopathy:    He has no cervical adenopathy.  Neurological: alert  Skin: Skin is warm and  dry. He is not diaphoretic. His skin is improving       ASSESSMENT/ PLAN:  1. Diabetes: will stop NPH will begin soliqua 100/33 30 units nightly will continue to monitor his status  2. Right hip and thigh pain: will begin aleve twice daily    Will place on dental list for his sore gums; will use orajel as needed  Will check ANA      MD is aware of  resident's narcotic use and is in agreement with current plan of care. We will attempt to wean resident as appropriate.    Ok Edwards NP [facility]  Contact [phone] Monday through Friday 8am- 5pm  After hours call [phone]

## 2016-06-07 DIAGNOSIS — L97419 Non-pressure chronic ulcer of right heel and midfoot with unspecified severity: Secondary | ICD-10-CM | POA: Diagnosis not present

## 2016-06-07 DIAGNOSIS — D485 Neoplasm of uncertain behavior of skin: Secondary | ICD-10-CM | POA: Diagnosis not present

## 2016-06-12 ENCOUNTER — Non-Acute Institutional Stay (SKILLED_NURSING_FACILITY): Payer: Medicare Other | Admitting: Adult Health

## 2016-06-12 ENCOUNTER — Encounter: Payer: Self-pay | Admitting: Adult Health

## 2016-06-12 DIAGNOSIS — E0800 Diabetes mellitus due to underlying condition with hyperosmolarity without nonketotic hyperglycemic-hyperosmolar coma (NKHHC): Secondary | ICD-10-CM | POA: Diagnosis not present

## 2016-06-12 DIAGNOSIS — Z794 Long term (current) use of insulin: Secondary | ICD-10-CM

## 2016-06-12 LAB — HEPATIC FUNCTION PANEL
ALT: 18 U/L (ref 10–40)
AST: 11 U/L — AB (ref 14–40)
Alkaline Phosphatase: 143 U/L — AB (ref 25–125)
BILIRUBIN, TOTAL: 0.2 mg/dL

## 2016-06-12 LAB — CBC AND DIFFERENTIAL
HEMATOCRIT: 32 % — AB (ref 41–53)
Hemoglobin: 9.8 g/dL — AB (ref 13.5–17.5)
PLATELETS: 431 10*3/uL — AB (ref 150–399)
WBC: 12.2 10*3/mL

## 2016-06-12 LAB — BASIC METABOLIC PANEL
BUN: 17 mg/dL (ref 4–21)
CREATININE: 0.7 mg/dL (ref 0.6–1.3)
Glucose: 256 mg/dL
Potassium: 3.8 mmol/L (ref 3.4–5.3)
Sodium: 138 mmol/L (ref 137–147)

## 2016-06-12 NOTE — Progress Notes (Signed)
Patient ID: [name], male   DOB: June 23, 1946, 70 y.o.   MRN: [id number]   Location:   [place] Room Number: 216-B Place of Service:  SNF (31)   CODE STATUS:Full Code  Allergies  Allergen Reactions  . Ace Inhibitors Swelling    Pt tolerates lisinopril  . Lipitor [Atorvastatin Calcium] Swelling  . Metformin And Related Swelling  . Cefadroxil Hives  . Cephalexin Hives  . Morphine And Related Other (See Comments)    Sweating, feels like is "in rocky boat."  . Robaxin [Methocarbamol] Other (See Comments)    Feels like he is shaky    Chief Complaint  Patient presents with  . Acute Visit    Diabetes    HPI:  His cbg's remain elevated. He is tolerating his medications without difficulty. He is not voicing any complaints. He does not want to have so many finger sticks during the day.   Past Medical History:  Diagnosis Date  . Arthritis    "hands, ankles, knees" (11/10/2014)  . Bladder spasms   . CAD (coronary artery disease)   . CAD in native artery 09/15/2015  . CHF (congestive heart failure) ([place])   . Chronic indwelling Foley catheter   . Chronic lower back pain   . Depression   . Diabetic diarrhea ([place])   . Diabetic neuropathy, painful ([place])   . DVT (deep venous thrombosis) (HCC) 1980's   LLE  . GERD (gastroesophageal reflux disease)   . Hyperlipidemia   . Hypertension   . Neurogenic bladder   . Obese   . OSA (obstructive sleep apnea)    "they wanted me to wear a mask; I couldn't" (11/10/2014)  . Pneumonia 1999  . PONV (postoperative nausea and vomiting)   . Stroke [facility]) 10/2011; 06/2014   right hand numbness/notes 10/20/2011, pt not sure he had stroke in 2013; "right hand weaker and right face not quite right since" (11/10/2014)  . Type II diabetes mellitus ([place])     Past Surgical History:  Procedure Laterality Date  . CARDIAC CATHETERIZATION N/A 05/02/2015   Procedure: Left Heart Cath and Coronary Angiography;  Surgeon: Lorretta Harp, MD;   Location: [place] CV LAB;  Service: Cardiovascular;  Laterality: N/A;  . CARDIAC CATHETERIZATION N/A 05/04/2015   Procedure: Left Heart Cath and Coronary Angiography;  Surgeon: Wellington Hampshire, MD;  Location: [place] CV LAB;  Service: Cardiovascular;  Laterality: N/A;  . CARPAL TUNNEL RELEASE Bilateral ~ 2003-2004  . CHOLECYSTECTOMY OPEN  1970's?  Marland Kitchen GASTROPLASTY    . JOINT REPLACEMENT    . LEFT HEART CATHETERIZATION WITH CORONARY ANGIOGRAM N/A 10/24/2011   Procedure: LEFT HEART CATHETERIZATION WITH CORONARY ANGIOGRAM;  Surgeon: Candee Furbish, MD;  Location: [facility] CATH LAB;  Service: Cardiovascular;  Laterality: N/A;  right radial artery approach  . TOTAL KNEE ARTHROPLASTY Right 1990's    Social History   Social History  . Marital status: Widowed    Spouse name: N/A  . Number of children: N/A  . Years of education: N/A   Occupational History  . Retired    Social History Main Topics  . Smoking status: Former Smoker    Packs/day: 2.00    Years: 20.00    Types: Cigarettes    Quit date: 09/10/1976  . Smokeless tobacco: Never Used  . Alcohol use No     Comment: FORMER ALCOHOLIC; "sober since 54/65/6812"  . Drug use: No  . Sexual activity: No   Other Topics Concern  .  Not on file   Social History Narrative   Lives alone.  No close family nearby.     Family History  Problem Relation Age of Onset  . Diabetes type I Father   . Hypertension Mother   . Cancer Brother      Testicular Cancer  . Cancer Brother     Leukemia      VITAL SIGNS BP 138/72   Pulse 84   Temp 98.5 F (36.9 C) (Oral)   Resp 18   Ht 6' 1"  (1.854 m)   Wt (!) 338 lb (153.3 kg)   SpO2 96%   BMI 44.59 kg/m   Patient's Medications  New Prescriptions   No medications on file  Previous Medications   ALUMINUM-MAGNESIUM HYDROXIDE-SIMETHICONE (MAALOX) 947-096-28 MG/5ML SUSP    Take 30 mLs by mouth every 6 (six) hours as needed.   AMLODIPINE (NORVASC) 5 MG TABLET    Take 5 mg by mouth daily.    ASPIRIN EC 81 MG TABLET    Take 81 mg by mouth daily. 0900   ESCITALOPRAM (LEXAPRO) 10 MG TABLET    Take 10 mg by mouth daily.    FEEDING SUPPLEMENT, GLUCERNA SHAKE, (GLUCERNA SHAKE) LIQD    Take 237 mLs by mouth 3 (three) times daily between meals.   FOLIC ACID (FOLVITE) 1 MG TABLET    Take 1 mg by mouth daily.   GABAPENTIN (NEURONTIN) 100 MG CAPSULE    Take 1 capsule (100 mg total) by mouth 2 (two) times daily.   GUAIFENESIN (MUCINEX) 600 MG 12 HR TABLET    Take 1 tablet (600 mg total) by mouth 2 (two) times daily.   HYDROXYZINE (ATARAX/VISTARIL) 25 MG TABLET    Take 25 mg by mouth 4 (four) times daily.    INSULIN ASPART (NOVOLOG FLEXPEN) 100 UNIT/ML FLEXPEN    0-20 Units, Subcutaneous, 3 times daily with meals Correction coverage: CBG < 70: implement hypoglycemia protocol CBG 70 - 120: 0 units CBG 121 - 150: 3 units CBG 151 - 200: 4 units CBG 201 - 250: 7 units CBG 251 - 300: 11 units CBG 301 - 350: 15 units CBG 351 - 400: 20 units CBG > 400: call MD   INSULIN ASPART (NOVOLOG) 100 UNIT/ML INJECTION    Inject 20 Units into the skin 3 (three) times daily with meals.   INSULIN GLARGINE (LANTUS) 100 UNIT/ML INJECTION    Inject 0.3 mLs (30 Units total) into the skin daily.   INSULIN GLARGINE (LANTUS) 100 UNIT/ML INJECTION    Inject 0.7 mLs (70 Units total) into the skin at bedtime.   IPRATROPIUM-ALBUTEROL (DUONEB) 0.5-2.5 (3) MG/3ML SOLN    Take 3 mLs by nebulization every 4 (four) hours as needed.   MAGNESIUM OXIDE (MAG-OX) 400 MG TABLET    Take 400 mg by mouth daily. 0900   METOPROLOL TARTRATE (LOPRESSOR) 25 MG TABLET    0.5 tablet by mouth two times a day 0900 , 2100   MULTIPLE VITAMINS-MINERALS (DECUBI-VITE) CAPS    Take 1 capsule by mouth daily at 3 pm.   NITROGLYCERIN (NITROSTAT) 0.4 MG SL TABLET    Place 0.4 mg under the tongue every 5 (five) minutes as needed for chest pain.   OMEPRAZOLE (PRILOSEC) 40 MG CAPSULE    Take 40 mg by mouth daily. 0600   POTASSIUM CHLORIDE (KLOR-CON) 20  MEQ PACKET    Take 40 mEq by mouth daily. 0800   PREDNISONE (DELTASONE) 20 MG TABLET    Take 40  mg by mouth daily with breakfast.   ROPINIROLE (REQUIP) 0.5 MG TABLET    Take 0.5 mg by mouth at bedtime. 2100   SENNOSIDES-DOCUSATE SODIUM (SENOKOT-S) 8.6-50 MG TABLET    Take 2 tablets by mouth at bedtime. 2100   TORSEMIDE (DEMADEX) 20 MG TABLET    Take 20 mg by mouth 2 (two) times daily.   TRAMADOL-ACETAMINOPHEN (ULTRACET) 37.5-325 MG TABLET    Take 1-2 tablets by mouth every 6 (six) hours as needed for moderate pain.   TRAZODONE (DESYREL) 50 MG TABLET    Take 25 mg by mouth at bedtime.   VANCOMYCIN (VANCOCIN) 750-5 MG/150ML-% SOLN    Inject 150 mLs (750 mg total) into the vein every 12 (twelve) hours. 3 weeks from 9/18   VITAMIN C (ASCORBIC ACID) 500 MG TABLET    Take 500 mg by mouth 2 (two) times daily. 0900 and 2100   ZINC SULFATE (ZINC-220 PO)    Take 1 tablet by mouth daily.  Modified Medications   No medications on file  Discontinued Medications   No medications on file     SIGNIFICANT DIAGNOSTIC EXAMS  10-12-15: chest x-ray: Enlarged cardiac silhouette. Pulmonary vascular congestion.  10-18-15: EEG: This is a normal EEG for the patients stated age.  There were no focal, hemispheric or lateralizing features.  No epileptiform activity was recorded.  A normal EEG does not exclude the diagnosis of a seizure disorder and if seizure remains high on the list of differential diagnosis, an ambulatory EEG may be of value.  Clinical correlation is required.  01-11-16: chest x-ray: Minimal left base atelectasis.  Otherwise, normal exam.  01-11-16: ct of head: No acute intracranial abnormality. Stable mild cerebral atrophy and chronic white matter disease. No definite acute cortical infarction.  01-11-16: scrotal ultrasound: 1. Left epididymo-orchitis. 2. Microlithiasis involving the left testicle. 3. Three adjacent complex cysts/spermatoceles involving the left epididymal head.  05-27-16: chest x-ray;  Low lung volumes and bibasilar atelectasis. No change.  05-28-16; ct of chest: 1. Chest CT findings are most suggestive of a cavitary right lower lobe pneumonia. 2. Small parapneumonic right pleural effusion with mild compressive atelectasis in the dependent right lung. 3. New mild right paratracheal and subcarinal lymphadenopathy, nonspecific, possibly reactive. 4. A follow-up chest CT with IV contrast is recommended in 3 months to document resolution of these findings. 5. New mild cardiomegaly.  Trace pericardial effusion/thickening. 6. Aortic atherosclerosis.  Three-vessel coronary atherosclerosis.   05-28-16: TEE: - Left ventricle: The cavity size was normal. There was severe concentric hypertrophy. Systolic function was normal. Wall motion was normal; there were no regional wall motion abnormalities. There was fusion of early and atrial contributions to ventricular filling. The study is not technically sufficient to allow evaluation of LV diastolic function. - Aortic valve: Valve mobility was restricted. There was mild to moderate stenosis.  - Mitral valve: Moderately calcified annulus. - Left atrium: The atrium was moderately dilated.  06-01-16: chest x-ray; No significant interval change in right effusion and lower lobe consolidation.   06-05-16: chest x-ray: right lung infiltrate     LABS REVIEWED:   09-10-15: hgb a1c 10.4 09-15-15; tsh 2.954 10-12-15; wbc 5.9; hgb 11.8; hct 36.1; mcv 93.5; pt 195; glucose 187; bun 8; creat 0.78; k+ 5.0; na++136 11-03-15: wbc 9.2; hgb 12.7; hct 39.7; mcv 89.3; plt 267; glucose 105; bun 17.2; creat 0.61; k+ 4.2; na++135  12-29-15: chol 180; ldl 115; trig 66; hdl 52; hgb a1c 8.8  01-11-16: wbc 19.6; hgb 13.6; hct  40.1; mcv 87.6; plt 232; glucose 255; bun 15; creat 0.67; k+ 4.7; na++132; liver normal albumin 2.9; blood culture: no growth; urine culture: e-coli-esbl; klebsiella pneumoniae  01-15-16: wbc 7.4; hgb 11.6; hct 35.7; mcv 89.9; plt 242; glucose 159; bun  5; creat 0.53; k+ 4.1; na++135  05-16-16: ANA: neg  05-27-16: wbc 29.3; hgb 13.6; hct 40.1; mcv 88.9 plt 227; glucose 587; bun 21; creat 1.12; k+ 4.9; na++ 127; alk phos 138; albumin 2.8; BNP 141.6; hgb a1c 11.7; urine culture; multiple bacteria; blood culture: MRSA; d-dimer: 0.38; cortisol 40.5 05-29-16: wbc 17.4; hgb 11.2; hct 34.7; mcv 90.6 ;plt 214; glucose 339; bun 26; creat 1.10; k+ 4.2; na++ 131; alk phos 209; ast 45; albumin 1.9; urine culture: MRSA  06-05-16: wbc 14.6; hgb 10.2; hct 32.9; mcv 89.4 plt 392; glucose 119; bun 14.2; creat 0.79; k+ 4.3; na++ 136; alk phos 198; albumin 2.2  06-12-16: wbc 12.2; hgb 9.8; hvt 32.1 ;mcv 89.0; plt 431; glucose 256; bun 16.6; creat 0.73; k+ 3.8; na++ 138; liver normal albumin 2.6       Review of Systems  Constitutional: Negative for malaise/fatigue.  Respiratory: Negative for cough and shortness of breath.   Cardiovascular: Positive for leg swelling. Negative for chest pain and palpitations.  Gastrointestinal: Negative for heartburn, abdominal pain and constipation.  Musculoskeletal: no complaint of pain  Skin: sores are improving.   Neurological: Negative for dizziness.  Psychiatric/Behavioral: The patient is not nervous/anxious.        Physical Exam  Constitutional: He is oriented to person, place, and time. No distress.  Obese   Eyes: Conjunctivae are normal.  Neck: Neck supple. No JVD present. No thyromegaly present.  Cardiovascular: Normal rate, regular rhythm and intact distal pulses.   Respiratory: Effort normal and breath sounds normal. No respiratory distress. He has no wheezes.  GI: Soft. Bowel sounds are normal. He exhibits no distension. There is no tenderness.  Genitourinary:  Has foley   Musculoskeletal: He exhibits edema.  Able to move all extremities Trace bilateral lower extremity edema     Lymphadenopathy:    He has no cervical adenopathy.  Neurological: alert  Skin: Skin is warm and dry. He is not diaphoretic. His  skin is improving       ASSESSMENT/ PLAN:  1. Diabetes: hgb a1c 11.7; will stop lantus and will begin tuojeo 100 units nightly will change novolog to 25 units with meals and will check cbg twice daily    Ok Edwards NP [facility]  Contact [phone] Monday through Friday 8am- 5pm  After hours call [phone]

## 2016-06-18 DIAGNOSIS — F329 Major depressive disorder, single episode, unspecified: Secondary | ICD-10-CM | POA: Diagnosis not present

## 2016-06-18 DIAGNOSIS — G4700 Insomnia, unspecified: Secondary | ICD-10-CM | POA: Diagnosis not present

## 2016-06-19 ENCOUNTER — Non-Acute Institutional Stay (SKILLED_NURSING_FACILITY): Payer: Medicare Other | Admitting: Adult Health

## 2016-06-19 ENCOUNTER — Encounter: Payer: Self-pay | Admitting: Adult Health

## 2016-06-19 DIAGNOSIS — Z794 Long term (current) use of insulin: Secondary | ICD-10-CM | POA: Diagnosis not present

## 2016-06-19 DIAGNOSIS — L109 Pemphigus, unspecified: Secondary | ICD-10-CM

## 2016-06-19 DIAGNOSIS — E0800 Diabetes mellitus due to underlying condition with hyperosmolarity without nonketotic hyperglycemic-hyperosmolar coma (NKHHC): Secondary | ICD-10-CM

## 2016-06-19 NOTE — Progress Notes (Signed)
Patient ID: [name], male   DOB: [birth year], 70 y.o.   MRN: [id number]    Location:   [place] Room Number: 216-B Place of Service:  SNF (31)   CODE STATUS: Full Code  Allergies  Allergen Reactions  . Ace Inhibitors Swelling    Pt tolerates lisinopril  . Lipitor [Atorvastatin Calcium] Swelling  . Metformin And Related Swelling  . Cefadroxil Hives  . Cephalexin Hives  . Morphine And Related Other (See Comments)    Sweating, feels like is "in rocky boat."  . Robaxin [Methocarbamol] Other (See Comments)    Feels like he is shaky    Chief Complaint  Patient presents with  . Acute Visit    Diabetes    HPI:  His cbg's remain elevated. He is declining his insulin at times. He has completed his abt and will need to restart his methotrexate. He is complaining of sore gums.   Past Medical History:  Diagnosis Date  . Arthritis    "hands, ankles, knees" (11/10/2014)  . Bladder spasms   . CAD (coronary artery disease)   . CAD in native artery 09/15/2015  . CHF (congestive heart failure) ([place])   . Chronic indwelling Foley catheter   . Chronic lower back pain   . Depression   . Diabetic diarrhea ([place])   . Diabetic neuropathy, painful ([place])   . DVT (deep venous thrombosis) (H[facility]) 1980's   LLE  . GERD (gastroesophageal reflux disease)   . Hyperlipidemia   . Hypertension   . Neurogenic bladder   . Obese   . OSA (obstructive sleep apnea)    "they wanted me to wear a mask; I couldn't" (11/10/2014)  . Pneumonia 1999  . PONV (postoperative nausea and vomiting)   . Stroke [facility]) 10/2011; 06/2014   right hand numbness/notes 10/20/2011, pt not sure he had stroke in 2013; "right hand weaker and right face not quite right since" (11/10/2014)  . Type II diabetes mellitus ([place])     Past Surgical History:  Procedure Laterality Date  . CARDIAC CATHETERIZATION N/A 05/02/2015   Procedure: Left Heart Cath and Coronary Angiography;  Surgeon: Lorretta Harp, MD;  Location: [place] CV LAB;  Service: Cardiovascular;  Laterality: N/A;  . CARDIAC CATHETERIZATION N/A 05/04/2015   Procedure: Left Heart Cath and Coronary Angiography;  Surgeon: Wellington Hampshire, MD;  Location: [place] CV LAB;  Service: Cardiovascular;  Laterality: N/A;  . CARPAL TUNNEL RELEASE Bilateral ~ 2003-2004  . CHOLECYSTECTOMY OPEN  1970's?  Marland Kitchen GASTROPLASTY    . JOINT REPLACEMENT    . LEFT HEART CATHETERIZATION WITH CORONARY ANGIOGRAM N/A 10/24/2011   Procedure: LEFT HEART CATHETERIZATION WITH CORONARY ANGIOGRAM;  Surgeon: Candee Furbish, MD;  Location: [facility] CATH LAB;  Service: Cardiovascular;  Laterality: N/A;  right radial artery approach  . TOTAL KNEE ARTHROPLASTY Right 1990's    Social History   Social History  . Marital status: Widowed    Spouse name: N/A  . Number of children: N/A  . Years of education: N/A   Occupational History  . Retired    Social History Main Topics  . Smoking status: Former Smoker    Packs/day: 2.00    Years: 20.00    Types: Cigarettes    Quit date: 09/10/1976  . Smokeless tobacco: Never Used  . Alcohol use No     Comment: FORMER ALCOHOLIC; "sober since 03/03/7627"  . Drug use: No  . Sexual activity: No   Other Topics Concern  .  Not on file   Social History Narrative   Lives alone.  No close family nearby.     Family History  Problem Relation Age of Onset  . Diabetes type I Father   . Hypertension Mother   . Cancer Brother      Testicular Cancer  . Cancer Brother     Leukemia      VITAL SIGNS BP 138/68   Pulse 84   Temp 97.9 F (36.6 C) (Oral)   Resp 18   Ht 6\' 1"  (1.854 m)   Wt (!) 342 lb (155.1 kg)   SpO2 98%   BMI 45.12 kg/m   Patient's Medications  New Prescriptions   No medications on file  Previous Medications   AMLODIPINE (NORVASC) 5 MG TABLET    Take 5 mg by mouth daily.   ASPIRIN EC 81 MG TABLET    Take 81 mg by mouth daily. 0900   ESCITALOPRAM (LEXAPRO) 10 MG TABLET    Take 10 mg by mouth daily.    FOLIC ACID  (FOLVITE) 1 MG TABLET    Take 1 mg by mouth daily.   GABAPENTIN (NEURONTIN) 100 MG CAPSULE    Take 1 capsule (100 mg total) by mouth 2 (two) times daily.   GUAIFENESIN (MUCINEX) 600 MG 12 HR TABLET    Take 1 tablet (600 mg total) by mouth 2 (two) times daily.   HYDROXYZINE (ATARAX/VISTARIL) 25 MG TABLET    Take 25 mg by mouth 4 (four) times daily.    INSULIN ASPART (NOVOLOG FLEXPEN) 100 UNIT/ML FLEXPEN    0-20 Units, Subcutaneous, 3 times daily with meals Correction coverage: CBG < 70: implement hypoglycemia protocol CBG 70 - 120: 0 units CBG 121 - 150: 3 units CBG 151 - 200: 4 units CBG 201 - 250: 7 units CBG 251 - 300: 11 units CBG 301 - 350: 15 units CBG 351 - 400: 20 units CBG > 400: call MD   INSULIN ASPART (NOVOLOG) 100 UNIT/ML INJECTION    Inject 20 Units into the skin 3 (three) times daily with meals.   INSULIN GLARGINE (TOUJEO SOLOSTAR) 300 UNIT/ML SOPN    Inject 100 Units into the skin at bedtime.   IPRATROPIUM-ALBUTEROL (DUONEB) 0.5-2.5 (3) MG/3ML SOLN    Take 3 mLs by nebulization every 4 (four) hours as needed.   MAGNESIUM OXIDE (MAG-OX) 400 MG TABLET    Take 400 mg by mouth daily. 0900   METOPROLOL TARTRATE (LOPRESSOR) 25 MG TABLET    0.5 tablet by mouth two times a day 0900 , 2100   MULTIPLE VITAMINS-MINERALS (DECUBI-VITE) CAPS    Take 1 capsule by mouth daily at 3 pm.   NITROGLYCERIN (NITROSTAT) 0.4 MG SL TABLET    Place 0.4 mg under the tongue every 5 (five) minutes as needed for chest pain.   OMEPRAZOLE (PRILOSEC) 40 MG CAPSULE    Take 40 mg by mouth daily. 0600   POTASSIUM CHLORIDE (KLOR-CON) 20 MEQ PACKET    Take 40 mEq by mouth daily. 0800   PREDNISONE (DELTASONE) 20 MG TABLET    Take 20 mg by mouth daily with breakfast.    ROPINIROLE (REQUIP) 0.5 MG TABLET    Take 0.5 mg by mouth at bedtime. 2100   SENNOSIDES-DOCUSATE SODIUM (SENOKOT-S) 8.6-50 MG TABLET    Take 2 tablets by mouth at bedtime. 2100   TORSEMIDE (DEMADEX) 20 MG TABLET    Take 20 mg by mouth 2 (two) times  daily.   TRAMADOL-ACETAMINOPHEN (ULTRACET) 37.5-325  MG TABLET    Take 1-2 tablets by mouth every 6 (six) hours as needed for moderate pain.   TRAZODONE (DESYREL) 50 MG TABLET    Take 25 mg by mouth at bedtime.   VITAMIN C (ASCORBIC ACID) 500 MG TABLET    Take 500 mg by mouth 2 (two) times daily. 0900 and 2100   ZINC SULFATE (ZINC-220 PO)    Take 1 tablet by mouth daily.  Modified Medications   No medications on file  Discontinued Medications   ALUMINUM-MAGNESIUM HYDROXIDE-SIMETHICONE (MAALOX) 200-200-20 MG/5ML SUSP    Take 30 mLs by mouth every 6 (six) hours as needed.   FEEDING SUPPLEMENT, GLUCERNA SHAKE, (GLUCERNA SHAKE) LIQD    Take 237 mLs by mouth 3 (three) times daily between meals.   INSULIN GLARGINE (LANTUS) 100 UNIT/ML INJECTION    Inject 0.3 mLs (30 Units total) into the skin daily.   INSULIN GLARGINE (LANTUS) 100 UNIT/ML INJECTION    Inject 0.7 mLs (70 Units total) into the skin at bedtime.   VANCOMYCIN (VANCOCIN) 750-5 MG/150ML-% SOLN    Inject 150 mLs (750 mg total) into the vein every 12 (twelve) hours. 3 weeks from 9/18     SIGNIFICANT DIAGNOSTIC EXAMS  10-12-15: chest x-ray: Enlarged cardiac silhouette. Pulmonary vascular congestion.  10-18-15: EEG: This is a normal EEG for the patients stated age.  There were no focal, hemispheric or lateralizing features.  No epileptiform activity was recorded.  A normal EEG does not exclude the diagnosis of a seizure disorder and if seizure remains high on the list of differential diagnosis, an ambulatory EEG may be of value.  Clinical correlation is required.  01-11-16: chest x-ray: Minimal left base atelectasis.  Otherwise, normal exam.  01-11-16: ct of head: No acute intracranial abnormality. Stable mild cerebral atrophy and chronic white matter disease. No definite acute cortical infarction.  01-11-16: scrotal ultrasound: 1. Left epididymo-orchitis. 2. Microlithiasis involving the left testicle. 3. Three adjacent complex cysts/spermatoceles  involving the left epididymal head.   LABS REVIEWED:   09-10-15: hgb a1c 10.4 09-15-15; tsh 2.954 10-12-15; wbc 5.9; hgb 11.8; hct 36.1; mcv 93.5; pt 195; glucose 187; bun 8; creat 0.78; k+ 5.0; na++136 11-03-15: wbc 9.2; hgb 12.7; hct 39.7; mcv 89.3; plt 267; glucose 105; bun 17.2; creat 0.61; k+ 4.2; na++135  12-29-15: chol 180; ldl 115; trig 66; hdl 52; hgb a1c 8.8  01-11-16: wbc 19.6; hgb 13.6; hct 40.1; mcv 87.6; plt 232; glucose 255; bun 15; creat 0.67; k+ 4.7; na++132; liver normal albumin 2.9; blood culture: no growth; urine culture: e-coli-esbl; klebsiella pneumoniae  01-15-16: wbc 7.4; hgb 11.6; hct 35.7; mcv 89.9; plt 242; glucose 159; bun 5; creat 0.53; k+ 4.1; na++135       Review of Systems  Constitutional: Negative for malaise/fatigue.  Respiratory: Negative for cough and shortness of breath.   Cardiovascular: Positive for leg swelling. Negative for chest pain and palpitations.  Gastrointestinal: Negative for heartburn, abdominal pain and constipation.  Musculoskeletal: right thigh pain   Skin: sores are improving.   Neurological: Negative for dizziness.  Psychiatric/Behavioral: The patient is not nervous/anxious.        Physical Exam  Constitutional: He is oriented to person, place, and time. No distress.  Obese   Eyes: Conjunctivae are normal.  Neck: Neck supple. No JVD present. No thyromegaly present.  Cardiovascular: Normal rate, regular rhythm and intact distal pulses.   Respiratory: Effort normal and breath sounds normal. No respiratory distress. He has no wheezes.  GI: Soft.  Bowel sounds are normal. He exhibits no distension. There is no tenderness.  Genitourinary:  Has foley   Musculoskeletal: He exhibits edema.  Able to move all extremities Trace bilateral lower extremity edema     Lymphadenopathy:    He has no cervical adenopathy.  Neurological: alert  Skin: Skin is warm and dry. He is not diaphoretic. His skin is improving       ASSESSMENT/  PLAN:  1. Diabetes: will continue tuojeo; novolog; will give his meal insulin after meals to help with adherence to his insulin regimen.   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   MD is aware of resident's narcotic use and is in agreement with current plan of care. We will attempt to wean resident as appropriate.      Ok Edwards NP [facility]  Contact [phone] Monday through Friday 8am- 5pm  After hours call [phone]

## 2016-06-21 DIAGNOSIS — L259 Unspecified contact dermatitis, unspecified cause: Secondary | ICD-10-CM | POA: Diagnosis not present

## 2016-06-21 DIAGNOSIS — L98413 Non-pressure chronic ulcer of buttock with necrosis of muscle: Secondary | ICD-10-CM | POA: Diagnosis not present

## 2016-06-21 DIAGNOSIS — L98412 Non-pressure chronic ulcer of buttock with fat layer exposed: Secondary | ICD-10-CM | POA: Diagnosis not present

## 2016-06-28 ENCOUNTER — Encounter: Payer: Self-pay | Admitting: Internal Medicine

## 2016-06-28 ENCOUNTER — Non-Acute Institutional Stay (SKILLED_NURSING_FACILITY): Payer: Medicare Other | Admitting: Internal Medicine

## 2016-06-28 DIAGNOSIS — Z79899 Other long term (current) drug therapy: Secondary | ICD-10-CM | POA: Diagnosis not present

## 2016-06-28 DIAGNOSIS — E1143 Type 2 diabetes mellitus with diabetic autonomic (poly)neuropathy: Secondary | ICD-10-CM

## 2016-06-28 DIAGNOSIS — N319 Neuromuscular dysfunction of bladder, unspecified: Secondary | ICD-10-CM | POA: Diagnosis not present

## 2016-06-28 DIAGNOSIS — L109 Pemphigus, unspecified: Secondary | ICD-10-CM

## 2016-06-28 DIAGNOSIS — E1165 Type 2 diabetes mellitus with hyperglycemia: Secondary | ICD-10-CM

## 2016-06-28 DIAGNOSIS — Z8673 Personal history of transient ischemic attack (TIA), and cerebral infarction without residual deficits: Secondary | ICD-10-CM | POA: Diagnosis not present

## 2016-06-28 DIAGNOSIS — I5032 Chronic diastolic (congestive) heart failure: Secondary | ICD-10-CM | POA: Diagnosis not present

## 2016-06-28 DIAGNOSIS — IMO0002 Reserved for concepts with insufficient information to code with codable children: Secondary | ICD-10-CM

## 2016-06-28 NOTE — Progress Notes (Signed)
Patient ID: [name], male   DOB: [birth year], 70 y.o.   MRN: [id number]    DATE: 06/28/2016  Location:    [facility] Room Number: 216 B Place of Service: SNF (31)   Extended Emergency Contact Information Primary Emergency Contact: Whiting,[name] Address: [address]          [place], [place] [address] [name] of [place] Mobile Phone: [phone] Relation: Friend Secondary Emergency Contact: Everton of [place] Phone: [phone] Mobile Phone: [phone] Relation: Brother  Advanced Directive information Does patient have an advance directive?: Yes, Does patient want to make changes to advanced directive?: No - Patient declined  Chief Complaint  Patient presents with  . Medical Management of Chronic Issues    Routine Visit    HPI:  70 yo male long term resident seen today for f/u. Rash improving but is still itchy at times. He has resumed weekly MTX. No nursing issues. No falls. No f/c. Appetite ok. Sleeps well. Pneumonia cleared with unasyn tx  GERD - stable on prilosec 40 mg daily   Chronic Diastolic heart failure -  EF is 55-60%. Takes  demadex 20 mg twice daily with k+ 40 meq daily   Hypertension - BP stable on lopressor 12.5 mg twice daily; noravsc 5 mg daily   Neurogenic bladder - has chronic indwelling foley  RLS - stable on requip 0.5 mg nightly   DM - uncontrolled. CBG 139 today. No low BS reactions. He takes  Novolog qAC and [employer]. A1c 11.7%   CAD - stable. He takes asa 81 mg daily and has ntg prn  Allergic rhinitis - stable off meds  Hx CVA - stable on asa 81 mg daily   Depression - mood stable on lexapro 10 mg daily   Pruritis - due to dry skin. 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 Takes neurontin 100 mg twice daily; ultracet 2 tabs every 6 hours as needed for pain.   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 Arthritis    "hands, ankles, knees" (11/10/2014)  . Bladder spasms   . CAD (coronary artery disease)   . CAD in native artery 09/15/2015  . CHF (congestive heart failure) ([place] [facility])   . Chronic indwelling Foley catheter   . Chronic lower back pain   . Depression   . Diabetic diarrhea ([facility])   . Diabetic neuropathy, painful ([place])   . DVT (deep venous thrombosis) ([facility]) 1980's   LLE  . GERD (gastroesophageal reflux disease)   . Hyperlipidemia   . Hypertension   . Neurogenic bladder   . Obese   . OSA (obstructive sleep apnea)    "they wanted me to wear a mask; I couldn't" (11/10/2014)  . Pneumonia 1999  . PONV (postoperative nausea and vomiting)   . Stroke [facility]) 10/2011; 06/2014   right hand numbness/notes 10/20/2011, pt not sure he had stroke in 2013; "right hand weaker and right face not quite right since" (11/10/2014)  . Type II diabetes mellitus ([facility])     Past Surgical History:  Procedure Laterality Date  . CARDIAC CATHETERIZATION N/A 05/02/2015   Procedure: Left Heart Cath and Coronary Angiography;  Surgeon: Lorretta Harp, MD;  Location: [facility];  Service: Cardiovascular;  Laterality: N/A;  . CARDIAC CATHETERIZATION N/A 05/04/2015   Procedure: Left Heart Cath and Coronary Angiography;  Surgeon: Wellington Hampshire, MD;  Location: [facility];  Service: Cardiovascular;  Laterality: N/A;  . CARPAL TUNNEL RELEASE Bilateral ~ 2003-2004  . CHOLECYSTECTOMY OPEN  1970's?  Marland Kitchen GASTROPLASTY    . JOINT REPLACEMENT    . LEFT HEART CATHETERIZATION WITH CORONARY ANGIOGRAM N/A 10/24/2011   Procedure: LEFT HEART CATHETERIZATION WITH CORONARY ANGIOGRAM;  Surgeon: Candee Furbish, MD;  Location: [facility] CATH LAB;  Service: Cardiovascular;  Laterality: N/A;  right radial artery approach  . TOTAL KNEE ARTHROPLASTY Right 1990's    Patient Care  Team: Lavone Orn, MD as PCP - General (Internal Medicine)  Social History   Social History  . Marital status: Widowed    Spouse name: N/A  . Number of children: N/A  . Years of education: N/A   Occupational History  . Retired    Social History Main Topics  . Smoking status: Former Smoker    Packs/day: 2.00    Years: 20.00    Types: Cigarettes    Quit date: 09/10/1976  . Smokeless tobacco: Never Used  . Alcohol use No     Comment: FORMER ALCOHOLIC; "sober since 29/52/8413"  . Drug use: No  . Sexual activity: No   Other Topics Concern  . Not on file   Social History Narrative   Lives alone.  No close family nearby.       reports that he quit smoking about 39 years ago. His smoking use included Cigarettes. He has a 40.00 pack-year smoking history. He has never used smokeless tobacco. He reports that he does not drink alcohol or use drugs.  Family History  Problem Relation Age of Onset  . Diabetes type I Father   . Hypertension Mother   . Cancer Brother      Testicular Cancer  . Cancer Brother     Leukemia   Family Status  Relation Status  . Father Deceased   DM/DVT  . Mother Deceased   old age  . Brother Alive  . Brother Alive  . Maternal Grandmother Deceased  . Maternal Grandfather Deceased  . Paternal Grandmother Deceased  . Paternal Grandfather Deceased  . Brother   . Brother     Immunization History  Administered Date(s) Administered  . Influenza Split 10/12/2011, 10/13/2011  . Influenza,inj,Quad PF,36+ Mos 05/06/2015  . Influenza-Unspecified 06/10/2014  . PPD Test 10/13/2015, 05/25/2016    Allergies  Allergen Reactions  . Ace Inhibitors Swelling    Pt tolerates lisinopril  . Lipitor [Atorvastatin Calcium] Swelling  . Metformin And Related Swelling  . Cefadroxil Hives  . Cephalexin Hives  . Morphine And Related Other (See Comments)    Sweating, feels like is "in rocky boat."  . Robaxin [Methocarbamol] Other (See Comments)    Feels like  he is shaky    Medications: Patient's Medications  New Prescriptions   No medications on file  Previous Medications   ALUMINUM-MAGNESIUM HYDROXIDE-SIMETHICONE (MAALOX) [phone] MG/5ML SUSP    Take 30 mLs by mouth every 6 (six) hours as needed.   AMLODIPINE (NORVASC) 5 MG TABLET    Take 5 mg by mouth daily.   ASPIRIN EC 81 MG TABLET    Take 81 mg by mouth daily. 0900   ESCITALOPRAM (LEXAPRO) 10 MG TABLET    Take 10 mg by mouth daily.    FOLIC ACID (FOLVITE) 1  MG TABLET    Take 1 mg by mouth daily.   GABAPENTIN (NEURONTIN) 100 MG CAPSULE    Take 1 capsule (100 mg total) by mouth 2 (two) times daily.   GUAIFENESIN (MUCINEX) 600 MG 12 HR TABLET    Take 1 tablet (600 mg total) by mouth 2 (two) times daily.   HYDROXYZINE (ATARAX/VISTARIL) 25 MG TABLET    Take 25 mg by mouth 4 (four) times daily.    INSULIN ASPART (NOVOLOG FLEXPEN) 100 UNIT/ML FLEXPEN    0-20 Units, Subcutaneous, 3 times daily with meals Correction coverage: CBG < 70: implement hypoglycemia protocol CBG 70 - 120: 0 units CBG 121 - 150: 3 units CBG 151 - 200: 4 units CBG 201 - 250: 7 units CBG 251 - 300: 11 units CBG 301 - 350: 15 units CBG 351 - 400: 20 units CBG > 400: call MD   INSULIN ASPART (NOVOLOG) 100 UNIT/ML INJECTION    Inject 25 Units into the skin 3 (three) times daily before meals.   INSULIN GLARGINE (TOUJEO SOLOSTAR) 300 UNIT/ML SOPN    Inject 100 Units into the skin at bedtime.   IPRATROPIUM-ALBUTEROL (DUONEB) 0.5-2.5 (3) MG/3ML SOLN    Take 3 mLs by nebulization every 4 (four) hours as needed.   MAGNESIUM OXIDE (MAG-OX) 400 MG TABLET    Take 400 mg by mouth daily. 0900   METHOTREXATE (RHEUMATREX) 7.5 MG TABLET    Take 7.5 mg by mouth once a week. Caution" Chemotherapy. Protect from light. On Wednesday   METOPROLOL TARTRATE (LOPRESSOR) 25 MG TABLET    0.5 tablet by mouth two times a day 0900 , 2100   MULTIPLE VITAMINS-MINERALS (DECUBI-VITE) CAPS    Take 1 capsule by mouth daily at 3 pm.   NITROGLYCERIN  (NITROSTAT) 0.4 MG SL TABLET    Place 0.4 mg under the tongue every 5 (five) minutes as needed for chest pain.   OMEPRAZOLE (PRILOSEC) 40 MG CAPSULE    Take 40 mg by mouth daily. 0600   POTASSIUM CHLORIDE (KLOR-CON) 20 MEQ PACKET    Take 40 mEq by mouth daily. 0800   PREDNISONE (DELTASONE) 20 MG TABLET    Take 20 mg by mouth daily with breakfast.    ROPINIROLE (REQUIP) 0.5 MG TABLET    Take 0.5 mg by mouth at bedtime. 2100   SENNOSIDES-DOCUSATE SODIUM (SENOKOT-S) 8.6-50 MG TABLET    Take 2 tablets by mouth at bedtime. 2100   TORSEMIDE (DEMADEX) 20 MG TABLET    Take 20 mg by mouth 2 (two) times daily.   TRAMADOL-ACETAMINOPHEN (ULTRACET) 37.5-325 MG TABLET    Take 1-2 tablets by mouth every 6 (six) hours as needed for moderate pain.   TRAZODONE (DESYREL) 50 MG TABLET    Take 25 mg by mouth at bedtime.   VITAMIN C (ASCORBIC ACID) 500 MG TABLET    Take 500 mg by mouth 2 (two) times daily. 0900 and 2100   ZINC SULFATE (ZINC-220 PO)    Take 1 tablet by mouth daily.  Modified Medications   No medications on file  Discontinued Medications   INSULIN ASPART (NOVOLOG) 100 UNIT/ML INJECTION    Inject 20 Units into the skin 3 (three) times daily with meals.    Review of Systems  Respiratory: Negative for shortness of breath.   Musculoskeletal: Positive for arthralgias.  Skin: Positive for rash.  All other systems reviewed and are negative.   Vitals:   06/28/16 1206  BP: 112/64  Pulse: 84  Resp: 18  Temp: 98.2 F (36.8 C)  TempSrc: Oral  SpO2: 98%  Weight: (!) 343 lb 1.6 oz (155.6 kg)  Height: 6' 1"  (1.854 m)   Body mass index is 45.27 kg/m.  Physical Exam  Constitutional: He is oriented to person, place, and time. He appears well-developed and well-nourished.  Sitting up in bed in NAD. No conversational dyspnea  HENT:  Mouth/Throat: Oropharynx is clear and moist. No oropharyngeal exudate.  No thrush  Eyes: Pupils are equal, round, and reactive to light. No scleral icterus.  Neck:  Neck supple. Carotid bruit is not present. No thyromegaly present.  Cardiovascular: Regular rhythm and intact distal pulses.  Tachycardia present.  Exam reveals no gallop and no friction rub.   Murmur (1/6 SEM) heard. Trace LE edema b/l. No calf TTP.   Pulmonary/Chest: Effort normal. No respiratory distress. He has no wheezes. He has no rales. He exhibits no tenderness.  No rhonchi. Reduced BS at base b/l  Abdominal: Soft. Bowel sounds are normal. He exhibits no distension, no abdominal bruit, no pulsatile midline mass and no mass. There is no hepatomegaly. There is no tenderness. There is no rebound and no guarding.  Genitourinary:  Genitourinary Comments: Foley DTG clear yellow urine  Musculoskeletal: He exhibits edema.  Lymphadenopathy:    He has no cervical adenopathy.  Neurological: He is alert and oriented to person, place, and time.  Skin: Skin is warm and dry. Rash (multiple bullous pemphigoid areas without secondary signs of infection. worse in UE and overall improved) noted.  Psychiatric: He has a normal mood and affect. His behavior is normal. Thought content normal.     Labs reviewed: Nursing Home on 06/28/2016  Component Date Value Ref Range Status  . Hemoglobin 06/12/2016 9.8* 13.5 - 17.5 g/dL Final  . HCT 06/12/2016 32* 41 - 53 % Final  . Platelets 06/12/2016 431* 150 - 399 K/L Final  . WBC 06/12/2016 12.2  10^3/mL Final  . Glucose 06/12/2016 256  mg/dL Final  . BUN 06/12/2016 17  4 - 21 mg/dL Final  . Creatinine 06/12/2016 0.7  0.6 - 1.3 mg/dL Final  . Potassium 06/12/2016 3.8  3.4 - 5.3 mmol/L Final  . Sodium 06/12/2016 138  137 - 147 mmol/L Final  . Alkaline Phosphatase 06/12/2016 143* 25 - 125 U/L Final  . ALT 06/12/2016 18  10 - 40 U/L Final  . AST 06/12/2016 11* 14 - 40 U/L Final  . Bilirubin, Total 06/12/2016 0.2  mg/dL Final  Nursing Home on 06/06/2016  Component Date Value Ref Range Status  . ANA Ab, IFA 06/06/2016 Negative   Final  Admission on  05/27/2016, Discharged on 06/02/2016  No results displayed because visit has over 200 results.    Nursing Home on 05/11/2016  Component Date Value Ref Range Status  . Hemoglobin 05/09/2016 11.6* 13.5 - 17.5 g/dL Final  . HCT 05/09/2016 36* 41 - 53 % Final  . Platelets 05/09/2016 364  150 - 399 K/L Final  . WBC 05/09/2016 15.7  10^3/mL Final  . Glucose 05/09/2016 264  mg/dL Final  . BUN 05/09/2016 22* 4 - 21 mg/dL Final  . Creatinine 05/09/2016 0.8  0.6 - 1.3 mg/dL Final  . Potassium 05/09/2016 4.5  3.4 - 5.3 mmol/L Final  . Sodium 05/09/2016 137  137 - 147 mmol/L Final  Nursing Home on 05/07/2016  Component Date Value Ref Range Status  . Microalb, Ur 03/26/2016 28.5   Final  . Hemoglobin A1C 03/26/2016 8.7   Final  Admission on 05/05/2016, Discharged on 05/05/2016  Component Date Value Ref Range Status  . WBC 05/05/2016 17.5* 4.0 - 10.5 K/uL Final  . RBC 05/05/2016 4.27  4.22 - 5.81 MIL/uL Final  . Hemoglobin 05/05/2016 12.9* 13.0 - 17.0 g/dL Final  . HCT 05/05/2016 37.6* 39.0 - 52.0 % Final  . MCV 05/05/2016 88.1  78.0 - 100.0 fL Final  . MCH 05/05/2016 30.2  26.0 - 34.0 pg Final  . MCHC 05/05/2016 34.3  30.0 - 36.0 g/dL Final  . RDW 05/05/2016 13.3  11.5 - 15.5 % Final  . Platelets 05/05/2016 365  150 - 400 K/uL Final  . Neutrophils Relative % 05/05/2016 65  % Final  . Neutro Abs 05/05/2016 11.4* 1.7 - 7.7 K/uL Final  . Lymphocytes Relative 05/05/2016 10  % Final  . Lymphs Abs 05/05/2016 1.7  0.7 - 4.0 K/uL Final  . Monocytes Relative 05/05/2016 7  % Final  . Monocytes Absolute 05/05/2016 1.3* 0.1 - 1.0 K/uL Final  . Eosinophils Relative 05/05/2016 17  % Final  . Eosinophils Absolute 05/05/2016 3.0* 0.0 - 0.7 K/uL Final  . Basophils Relative 05/05/2016 1  % Final  . Basophils Absolute 05/05/2016 0.1  0.0 - 0.1 K/uL Final  . Sodium 05/05/2016 134* 135 - 145 mmol/L Final  . Potassium 05/05/2016 3.8  3.5 - 5.1 mmol/L Final  . Chloride 05/05/2016 98* 101 - 111 mmol/L Final  .  CO2 05/05/2016 29  22 - 32 mmol/L Final  . Glucose, Bld 05/05/2016 105* 65 - 99 mg/dL Final  . BUN 05/05/2016 13  6 - 20 mg/dL Final  . Creatinine, Ser 05/05/2016 0.69  0.61 - 1.24 mg/dL Final  . Calcium 05/05/2016 8.6* 8.9 - 10.3 mg/dL Final  . GFR calc non Af Amer 05/05/2016 >60  >60 mL/min Final  . GFR calc Af Amer 05/05/2016 >60  >60 mL/min Final   Comment: (NOTE) The eGFR has been calculated using the CKD EPI equation. This calculation has not been validated in all clinical situations. eGFR's persistently <60 mL/min signify possible Chronic Kidney Disease.   . Anion gap 05/05/2016 7  5 - 15 Final  . Sed Rate 05/05/2016 60* 0 - 16 mm/hr Final    Dg Chest Port 1 View  Result Date: 06/01/2016 CLINICAL DATA:  Shortness of Breath EXAM: PORTABLE CHEST 1 VIEW COMPARISON:  05/28/2016 FINDINGS: Right-sided pleural effusion and underlying lower lobe consolidation is again noted. The left lung remains clear. Cardiac shadow is mildly enlarged but accentuated by the portable technique. No acute bony abnormality is seen. IMPRESSION: No significant interval change in right effusion and lower lobe consolidation. Electronically Signed   By: Inez Catalina M.D.   On: 06/01/2016 12:19     Assessment/Plan   ICD-9-CM ICD-10-CM   1. Bullous pemphigus 694.4 L10.9   2. Uncontrolled type II diabetes with peripheral autonomic neuropathy ([facility]) 250.62 E11.43    337.1 E11.65   3. Chronic diastolic CHF (congestive heart failure) ([facility]) 428.32 I50.32    428.0    4. Neurogenic bladder 596.54 N31.9   5. H/O: CVA (cerebrovascular accident) V12.54 Z86.73   6. High risk medication use V58.69 Z79.899     Check CBC and CMP in 1 month  Cont other meds as ordered  PT/OT/ST as indicated  Foley cath care as indicated  F/u with specialists as scheduled  Will follow   Emile Kyllo S. Maycol Hoying, [facility] [address]  [address], [place] [address] [phone]  Cell (Monday-Friday 8 AM - 5 PM) [phone] After 5 PM and follow prompts

## 2016-06-29 DIAGNOSIS — L98413 Non-pressure chronic ulcer of buttock with necrosis of muscle: Secondary | ICD-10-CM | POA: Diagnosis not present

## 2016-06-29 DIAGNOSIS — L98412 Non-pressure chronic ulcer of buttock with fat layer exposed: Secondary | ICD-10-CM | POA: Diagnosis not present

## 2016-06-29 DIAGNOSIS — L259 Unspecified contact dermatitis, unspecified cause: Secondary | ICD-10-CM | POA: Diagnosis not present

## 2016-07-05 ENCOUNTER — Encounter: Payer: Self-pay | Admitting: Infectious Diseases

## 2016-07-05 DIAGNOSIS — L89523 Pressure ulcer of left ankle, stage 3: Secondary | ICD-10-CM | POA: Diagnosis not present

## 2016-07-05 DIAGNOSIS — L22 Diaper dermatitis: Secondary | ICD-10-CM | POA: Diagnosis not present

## 2016-07-05 DIAGNOSIS — L98413 Non-pressure chronic ulcer of buttock with necrosis of muscle: Secondary | ICD-10-CM | POA: Diagnosis not present

## 2016-07-09 ENCOUNTER — Ambulatory Visit: Payer: Medicare Other | Admitting: Infectious Diseases

## 2016-07-11 ENCOUNTER — Encounter: Payer: Self-pay | Admitting: Adult Health

## 2016-07-11 ENCOUNTER — Non-Acute Institutional Stay (SKILLED_NURSING_FACILITY): Payer: Medicare Other | Admitting: Adult Health

## 2016-07-11 DIAGNOSIS — E1165 Type 2 diabetes mellitus with hyperglycemia: Secondary | ICD-10-CM

## 2016-07-11 DIAGNOSIS — L109 Pemphigus, unspecified: Secondary | ICD-10-CM

## 2016-07-11 DIAGNOSIS — IMO0002 Reserved for concepts with insufficient information to code with codable children: Secondary | ICD-10-CM

## 2016-07-11 DIAGNOSIS — E1143 Type 2 diabetes mellitus with diabetic autonomic (poly)neuropathy: Secondary | ICD-10-CM | POA: Diagnosis not present

## 2016-07-11 NOTE — Progress Notes (Signed)
Patient ID: [name], male   DOB: Dec 20, 1945, 70 y.o.   MRN: [id number]    Location:    [facility] Room Number: 216-B Place of Service:  SNF (31)   CODE STATUS: Full Code  Allergies  Allergen Reactions  . Ace Inhibitors Swelling    Pt tolerates lisinopril  . Lipitor [Atorvastatin Calcium] Swelling  . Metformin And Related Swelling  . Cefadroxil Hives  . Cephalexin Hives  . Morphine And Related Other (See Comments)    Sweating, feels like is "in rocky boat."  . Robaxin [Methocarbamol] Other (See Comments)    Feels like he is shaky    Chief Complaint  Patient presents with  . Acute Visit    HPI:  Staff reports that he is not taking his insulin on a routine basis and is not taking his k+. They are also concerned about his pemphigoid rash is getting worse. The methotrexate was held while he was on abt; this has just been restarted. There are no reports of fever present. We had a long discussion about his medications; the importance of him taking his medications as ordered and his risks for not taking his insulin and potassium. He did verbalize understanding and did tell me that he would take his medications.    Past Medical History:  Diagnosis Date  . Arthritis    "hands, ankles, knees" (11/10/2014)  . Bladder spasms   . CAD (coronary artery disease)   . CAD in native artery 09/15/2015  . CHF (congestive heart failure) ([place])   . Chronic indwelling Foley catheter   . Chronic lower back pain   . Depression   . Diabetic diarrhea ([place])   . Diabetic neuropathy, painful ([place])   . DVT (deep venous thrombosis) ([facility]) 1980's   LLE  . GERD (gastroesophageal reflux disease)   . Hyperlipidemia   . Hypertension   . Neurogenic bladder   . Obese   . OSA (obstructive sleep apnea)    "they wanted me to wear a mask; I couldn't" (11/10/2014)  . Pneumonia 1999  . PONV (postoperative nausea and vomiting)   . Stroke [facility]) 10/2011; 06/2014   right hand numbness/notes  10/20/2011, pt not sure he had stroke in 2013; "right hand weaker and right face not quite right since" (11/10/2014)  . Type II diabetes mellitus ([place])     Past Surgical History:  Procedure Laterality Date  . CARDIAC CATHETERIZATION N/A 05/02/2015   Procedure: Left Heart Cath and Coronary Angiography;  Surgeon: Lorretta Harp, MD;  Location: [place] [facility] CV LAB;  Service: Cardiovascular;  Laterality: N/A;  . CARDIAC CATHETERIZATION N/A 05/04/2015   Procedure: Left Heart Cath and Coronary Angiography;  Surgeon: Wellington Hampshire, MD;  Location: [place] CV LAB;  Service: Cardiovascular;  Laterality: N/A;  . CARPAL TUNNEL RELEASE Bilateral ~ 2003-2004  . CHOLECYSTECTOMY OPEN  1970's?  [facility] GASTROPLASTY    . JOINT REPLACEMENT    . LEFT HEART CATHETERIZATION WITH CORONARY ANGIOGRAM N/A 10/24/2011   Procedure: LEFT HEART CATHETERIZATION WITH CORONARY ANGIOGRAM;  Surgeon: Candee Furbish, MD;  Location: [facility] CATH LAB;  Service: Cardiovascular;  Laterality: N/A;  right radial artery approach  . TOTAL KNEE ARTHROPLASTY Right 1990's    Social History   Social History  . Marital status: Widowed    Spouse name: N/A  . Number of children: N/A  . Years of education: N/A   Occupational History  . Retired    Social History Main Topics  . Smoking  status: Former Smoker    Packs/day: 2.00    Years: 20.00    Types: Cigarettes    Quit date: 09/10/1976  . Smokeless tobacco: Never Used  . Alcohol use No     Comment: FORMER ALCOHOLIC; "sober since 71/02/2693"  . Drug use: No  . Sexual activity: No   Other Topics Concern  . Not on file   Social History Narrative   Lives alone.  No close family nearby.     Family History  Problem Relation Age of Onset  . Diabetes type I Father   . Hypertension Mother   . Cancer Brother      Testicular Cancer  . Cancer Brother     Leukemia      VITAL SIGNS BP 132/70   Pulse 78   Temp 97.9 F (36.6 C) (Oral)   Resp 19   Ht 6' 1"  (1.854 m)   Wt (!) 343 lb  (155.6 kg)   SpO2 98%   BMI 45.25 kg/m   Patient's Medications  New Prescriptions   No medications on file  Previous Medications   ALUMINUM-MAGNESIUM HYDROXIDE-SIMETHICONE (MAALOX) 854-627-03 MG/5ML SUSP    Take 30 mLs by mouth every 6 (six) hours as needed.   AMLODIPINE (NORVASC) 5 MG TABLET    Take 5 mg by mouth daily.   ASPIRIN EC 81 MG TABLET    Take 81 mg by mouth daily. 0900   ESCITALOPRAM (LEXAPRO) 10 MG TABLET    Take 10 mg by mouth daily.    FOLIC ACID (FOLVITE) 1 MG TABLET    Take 1 mg by mouth daily.   GABAPENTIN (NEURONTIN) 100 MG CAPSULE    Take 1 capsule (100 mg total) by mouth 2 (two) times daily.   GUAIFENESIN (MUCINEX) 600 MG 12 HR TABLET    Take 1 tablet (600 mg total) by mouth 2 (two) times daily.   HYDROXYZINE (ATARAX/VISTARIL) 25 MG TABLET    Take 25 mg by mouth 4 (four) times daily.    INSULIN ASPART (NOVOLOG FLEXPEN) 100 UNIT/ML FLEXPEN    0-20 Units, Subcutaneous, 3 times daily with meals Correction coverage: CBG < 70: implement hypoglycemia protocol CBG 70 - 120: 0 units CBG 121 - 150: 3 units CBG 151 - 200: 4 units CBG 201 - 250: 7 units CBG 251 - 300: 11 units CBG 301 - 350: 15 units CBG 351 - 400: 20 units CBG > 400: call MD   INSULIN ASPART (NOVOLOG) 100 UNIT/ML INJECTION    Inject 25 Units into the skin 3 (three) times daily before meals.   INSULIN GLARGINE (TOUJEO SOLOSTAR) 300 UNIT/ML SOPN    Inject 100 Units into the skin at bedtime.   IPRATROPIUM-ALBUTEROL (DUONEB) 0.5-2.5 (3) MG/3ML SOLN    Take 3 mLs by nebulization every 4 (four) hours as needed.   MAGNESIUM OXIDE (MAG-OX) 400 MG TABLET    Take 400 mg by mouth daily. 0900   METHOTREXATE (RHEUMATREX) 7.5 MG TABLET    Take 7.5 mg by mouth once a week. Caution" Chemotherapy. Protect from light. On Wednesday   METOPROLOL TARTRATE (LOPRESSOR) 25 MG TABLET    0.5 tablet by mouth two times a day 0900 , 2100   MULTIPLE VITAMINS-MINERALS (DECUBI-VITE) CAPS    Take 1 capsule by mouth daily at 3 pm.    NITROGLYCERIN (NITROSTAT) 0.4 MG SL TABLET    Place 0.4 mg under the tongue every 5 (five) minutes as needed for chest pain.   OMEPRAZOLE (PRILOSEC) 40 MG  CAPSULE    Take 40 mg by mouth daily. 0600   POTASSIUM CHLORIDE (KLOR-CON) 20 MEQ PACKET    Take 40 mEq by mouth daily. 0800   PREDNISONE (DELTASONE) 20 MG TABLET    Take 20 mg by mouth daily with breakfast.    ROPINIROLE (REQUIP) 0.5 MG TABLET    Take 0.5 mg by mouth at bedtime. 2100   SENNOSIDES-DOCUSATE SODIUM (SENOKOT-S) 8.6-50 MG TABLET    Take 2 tablets by mouth at bedtime. 2100   TORSEMIDE (DEMADEX) 20 MG TABLET    Take 20 mg by mouth 2 (two) times daily.   TRAMADOL-ACETAMINOPHEN (ULTRACET) 37.5-325 MG TABLET    Take 1-2 tablets by mouth every 6 (six) hours as needed for moderate pain.   TRAZODONE (DESYREL) 50 MG TABLET    Take 25 mg by mouth at bedtime.   VITAMIN C (ASCORBIC ACID) 500 MG TABLET    Take 500 mg by mouth 2 (two) times daily. 0900 and 2100   ZINC SULFATE (ZINC-220 PO)    Take 1 tablet by mouth daily.  Modified Medications   No medications on file  Discontinued Medications   No medications on file     SIGNIFICANT DIAGNOSTIC EXAMS  10-12-15: chest x-ray: Enlarged cardiac silhouette. Pulmonary vascular congestion.  10-18-15: EEG: This is a normal EEG for the patients stated age.  There were no focal, hemispheric or lateralizing features.  No epileptiform activity was recorded.  A normal EEG does not exclude the diagnosis of a seizure disorder and if seizure remains high on the list of differential diagnosis, an ambulatory EEG may be of value.  Clinical correlation is required.  01-11-16: chest x-ray: Minimal left base atelectasis.  Otherwise, normal exam.  01-11-16: ct of head: No acute intracranial abnormality. Stable mild cerebral atrophy and chronic white matter disease. No definite acute cortical infarction.  01-11-16: scrotal ultrasound: 1. Left epididymo-orchitis. 2. Microlithiasis involving the left testicle. 3. Three  adjacent complex cysts/spermatoceles involving the left epididymal head.   05-27-16: chest x-ray; Low lung volumes and bibasilar atelectasis. No change.  05-28-16; ct of chest: 1. Chest CT findings are most suggestive of a cavitary right lower lobe pneumonia. 2. Small parapneumonic right pleural effusion with mild compressive atelectasis in the dependent right lung. 3. New mild right paratracheal and subcarinal lymphadenopathy, nonspecific, possibly reactive. 4. A follow-up chest CT with IV contrast is recommended in 3 months to document resolution of these findings. 5. New mild cardiomegaly.  Trace pericardial effusion/thickening. 6. Aortic atherosclerosis.  Three-vessel coronary atherosclerosis.   05-28-16: TEE: - Left ventricle: The cavity size was normal. There was severe concentric hypertrophy. Systolic function was normal. Wall motion was normal; there were no regional wall motion abnormalities. There was fusion of early and atrial contributions to ventricular filling. The study is not technically sufficient to allow evaluation of LV diastolic function. - Aortic valve: Valve mobility was restricted. There was mild to moderate stenosis.  - Mitral valve: Moderately calcified annulus. - Left atrium: The atrium was moderately dilated.  06-01-16: chest x-ray; No significant interval change in right effusion and lower lobe consolidation.   06-05-16: chest x-ray: right lung infiltrate     LABS REVIEWED:   09-10-15: hgb a1c 10.4 09-15-15; tsh 2.954 10-12-15; wbc 5.9; hgb 11.8; hct 36.1; mcv 93.5; pt 195; glucose 187; bun 8; creat 0.78; k+ 5.0; na++136 11-03-15: wbc 9.2; hgb 12.7; hct 39.7; mcv 89.3; plt 267; glucose 105; bun 17.2; creat 0.61; k+ 4.2; na++135  12-29-15: chol 180;  ldl 115; trig 66; hdl 52; hgb a1c 8.8  01-11-16: wbc 19.6; hgb 13.6; hct 40.1; mcv 87.6; plt 232; glucose 255; bun 15; creat 0.67; k+ 4.7; na++132; liver normal albumin 2.9; blood culture: no growth; urine culture:  e-coli-esbl; klebsiella pneumoniae  01-15-16: wbc 7.4; hgb 11.6; hct 35.7; mcv 89.9; plt 242; glucose 159; bun 5; creat 0.53; k+ 4.1; na++135  05-16-16: ANA: neg  05-27-16: wbc 29.3; hgb 13.6; hct 40.1; mcv 88.9 plt 227; glucose 587; bun 21; creat 1.12; k+ 4.9; na++ 127; alk phos 138; albumin 2.8; BNP 141.6; hgb a1c 11.7; urine culture; multiple bacteria; blood culture: MRSA; d-dimer: 0.38; cortisol 40.5 05-29-16: wbc 17.4; hgb 11.2; hct 34.7; mcv 90.6 ;plt 214; glucose 339; bun 26; creat 1.10; k+ 4.2; na++ 131; alk phos 209; ast 45; albumin 1.9; urine culture: MRSA  06-05-16: wbc 14.6; hgb 10.2; hct 32.9; mcv 89.4 plt 392; glucose 119; bun 14.2; creat 0.79; k+ 4.3; na++ 136; alk phos 198; albumin 2.2  06-12-16: wbc 12.2; hgb 9.8; hvt 32.1 ;mcv 89.0; plt 431; glucose 256; bun 16.6; creat 0.73; k+ 3.8; na++ 138; liver normal albumin 2.6      Review of Systems  Constitutional: Negative for malaise/fatigue.  Respiratory: Negative for cough and shortness of breath.   Cardiovascular: Positive for leg swelling. Negative for chest pain and palpitations.  Gastrointestinal: Negative for heartburn, abdominal pain and constipation.  Musculoskeletal: right thigh pain   Skin: sores are improving.   Neurological: Negative for dizziness.  Psychiatric/Behavioral: The patient is not nervous/anxious.        Physical Exam  Constitutional: He is oriented to person, place, and time. No distress.  Obese   Eyes: Conjunctivae are normal.  Neck: Neck supple. No JVD present. No thyromegaly present.  Cardiovascular: Normal rate, regular rhythm and intact distal pulses.   Respiratory: Effort normal and breath sounds normal. No respiratory distress. He has no wheezes.  GI: Soft. Bowel sounds are normal. He exhibits no distension. There is no tenderness.  Genitourinary:  Has foley   Musculoskeletal: He exhibits edema.  Able to move all extremities Trace bilateral lower extremity edema     Lymphadenopathy:    He has  no cervical adenopathy.  Neurological: alert  Skin: Skin is warm and dry. He is not diaphoretic. His skin lesions are inflamed      ASSESSMENT/ PLAN:  1. Diabetes: will continue tuojeo; novolog; will give his meal insulin after meals to help with adherence to his insulin regimen.   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 We will attempt to wean resident as apropriate   Ok Edwards NP [facility]  Contact [phone] Monday through Friday 8am- 5pm  After hours call [phone]

## 2016-07-13 ENCOUNTER — Encounter: Payer: Self-pay | Admitting: Infectious Diseases

## 2016-07-13 ENCOUNTER — Non-Acute Institutional Stay (SKILLED_NURSING_FACILITY): Payer: Medicare Other | Admitting: Adult Health

## 2016-07-13 ENCOUNTER — Encounter: Payer: Self-pay | Admitting: Adult Health

## 2016-07-13 DIAGNOSIS — E1165 Type 2 diabetes mellitus with hyperglycemia: Secondary | ICD-10-CM

## 2016-07-13 DIAGNOSIS — IMO0002 Reserved for concepts with insufficient information to code with codable children: Secondary | ICD-10-CM

## 2016-07-13 DIAGNOSIS — E1143 Type 2 diabetes mellitus with diabetic autonomic (poly)neuropathy: Secondary | ICD-10-CM

## 2016-07-13 DIAGNOSIS — L22 Diaper dermatitis: Secondary | ICD-10-CM | POA: Diagnosis not present

## 2016-07-13 DIAGNOSIS — L89523 Pressure ulcer of left ankle, stage 3: Secondary | ICD-10-CM | POA: Diagnosis not present

## 2016-07-13 DIAGNOSIS — L98413 Non-pressure chronic ulcer of buttock with necrosis of muscle: Secondary | ICD-10-CM | POA: Diagnosis not present

## 2016-07-13 NOTE — Progress Notes (Signed)
Patient ID: [name], male   DOB: 1946/08/18, 70 y.o.   MRN: [id number]   Location:    [place] Room Number: 216-B Place of Service:  SNF (31)   CODE STATUS: Full Code  Allergies  Allergen Reactions  . Ace Inhibitors Swelling    Pt tolerates lisinopril  . Lipitor [Atorvastatin Calcium] Swelling  . Metformin And Related Swelling  . Cefadroxil Hives  . Cephalexin Hives  . Morphine And Related Other (See Comments)    Sweating, feels like is "in rocky boat."  . Robaxin [Methocarbamol] Other (See Comments)    Feels like he is shaky    Chief Complaint  Patient presents with  . Acute Visit    Diabetes    HPI:  His cbgs remain elevated. He has done better with taking his insulin he tells me. He has concerns with readings "too low" for him to take his meal time insulin.  He states that he is willing to change to check cbgs after meals. He denies any symptoms.    Past Medical History:  Diagnosis Date  . Arthritis    "hands, ankles, knees" (11/10/2014)  . Bladder spasms   . CAD (coronary artery disease)   . CAD in native artery 09/15/2015  . CHF (congestive heart failure) ([place])   . Chronic indwelling Foley catheter   . Chronic lower back pain   . Depression   . Diabetic diarrhea ([place])   . Diabetic neuropathy, painful ([place])   . DVT (deep venous thrombosis) (H[facility]) 1980's   LLE  . GERD (gastroesophageal reflux disease)   . Hyperlipidemia   . Hypertension   . Neurogenic bladder   . Obese   . OSA (obstructive sleep apnea)    "they wanted me to wear a mask; I couldn't" (11/10/2014)  . Pneumonia 1999  . PONV (postoperative nausea and vomiting)   . Stroke [facility]) 10/2011; 06/2014   right hand numbness/notes 10/20/2011, pt not sure he had stroke in 2013; "right hand weaker and right face not quite right since" (11/10/2014)  . Type II diabetes mellitus ([place])     Past Surgical History:  Procedure Laterality Date  . CARDIAC CATHETERIZATION N/A 05/02/2015   Procedure:  Left Heart Cath and Coronary Angiography;  Surgeon: Lorretta Harp, MD;  Location: [place] CV LAB;  Service: Cardiovascular;  Laterality: N/A;  . CARDIAC CATHETERIZATION N/A 05/04/2015   Procedure: Left Heart Cath and Coronary Angiography;  Surgeon: Wellington Hampshire, MD;  Location: [place] CV LAB;  Service: Cardiovascular;  Laterality: N/A;  . CARPAL TUNNEL RELEASE Bilateral ~ 2003-2004  . CHOLECYSTECTOMY OPEN  1970's?  Marland Kitchen GASTROPLASTY    . JOINT REPLACEMENT    . LEFT HEART CATHETERIZATION WITH CORONARY ANGIOGRAM N/A 10/24/2011   Procedure: LEFT HEART CATHETERIZATION WITH CORONARY ANGIOGRAM;  Surgeon: Candee Furbish, MD;  Location: [facility] CATH LAB;  Service: Cardiovascular;  Laterality: N/A;  right radial artery approach  . TOTAL KNEE ARTHROPLASTY Right 1990's    Social History   Social History  . Marital status: Widowed    Spouse name: N/A  . Number of children: N/A  . Years of education: N/A   Occupational History  . Retired    Social History Main Topics  . Smoking status: Former Smoker    Packs/day: 2.00    Years: 20.00    Types: Cigarettes    Quit date: 09/10/1976  . Smokeless tobacco: Never Used  . Alcohol use No     Comment: FORMER  ALCOHOLIC; "sober since 40/98/1191"  . Drug use: No  . Sexual activity: No   Other Topics Concern  . Not on file   Social History Narrative   Lives alone.  No close family nearby.     Family History  Problem Relation Age of Onset  . Diabetes type I Father   . Hypertension Mother   . Cancer Brother      Testicular Cancer  . Cancer Brother     Leukemia      VITAL SIGNS BP 118/73   Pulse 76   Temp 97.9 F (36.6 C) (Oral)   Resp 18   Ht _0  (1.854 m)   Wt (!) 343 lb (155.6 kg)   SpO2 97%   BMI 45.25 kg/m   Patient's Medications  New Prescriptions   No medications on file  Previous Medications   ALUMINUM-MAGNESIUM HYDROXIDE-SIMETHICONE (MAALOX) 478-295-62 MG/5ML SUSP    Take 30 mLs by mouth every 6 (six) hours as  needed.   AMLODIPINE (NORVASC) 5 MG TABLET    Take 5 mg by mouth daily.   ASPIRIN EC 81 MG TABLET    Take 81 mg by mouth daily. 0900   ESCITALOPRAM (LEXAPRO) 10 MG TABLET    Take 10 mg by mouth daily.    FOLIC ACID (FOLVITE) 1 MG TABLET    Take 1 mg by mouth daily.   GABAPENTIN (NEURONTIN) 100 MG CAPSULE    Take 1 capsule (100 mg total) by mouth 2 (two) times daily.   GUAIFENESIN (MUCINEX) 600 MG 12 HR TABLET    Take 1 tablet (600 mg total) by mouth 2 (two) times daily.   HYDROXYZINE (ATARAX/VISTARIL) 25 MG TABLET    Take 25 mg by mouth 4 (four) times daily.    INSULIN ASPART (NOVOLOG FLEXPEN) 100 UNIT/ML FLEXPEN    0-20 Units, Subcutaneous, 3 times daily with meals Correction coverage: CBG < 70: implement hypoglycemia protocol CBG 70 - 120: 0 units CBG 121 - 150: 3 units CBG 151 - 200: 4 units CBG 201 - 250: 7 units CBG 251 - 300: 11 units CBG 301 - 350: 15 units CBG 351 - 400: 20 units CBG > 400: call MD   INSULIN ASPART (NOVOLOG) 100 UNIT/ML INJECTION    Inject 25 Units into the skin 3 (three) times daily before meals.   INSULIN GLARGINE (TOUJEO SOLOSTAR) 300 UNIT/ML SOPN    Inject 100 Units into the skin at bedtime.   IPRATROPIUM-ALBUTEROL (DUONEB) 0.5-2.5 (3) MG/3ML SOLN    Take 3 mLs by nebulization every 4 (four) hours as needed.   MAGNESIUM OXIDE (MAG-OX) 400 MG TABLET    Take 400 mg by mouth daily. 0900   METHOTREXATE (RHEUMATREX) 7.5 MG TABLET    Take 7.5 mg by mouth once a week. Caution" Chemotherapy. Protect from light. On Wednesday   METOPROLOL TARTRATE (LOPRESSOR) 25 MG TABLET    0.5 tablet by mouth two times a day 0900 , 2100   MULTIPLE VITAMINS-MINERALS (DECUBI-VITE) CAPS    Take 1 capsule by mouth daily at 3 pm.   NITROGLYCERIN (NITROSTAT) 0.4 MG SL TABLET    Place 0.4 mg under the tongue every 5 (five) minutes as needed for chest pain.   OMEPRAZOLE (PRILOSEC) 40 MG CAPSULE    Take 40 mg by mouth daily. 0600   POTASSIUM CHLORIDE (KLOR-CON) 20 MEQ PACKET    Take 40 mEq by  mouth daily. 0800   PREDNISONE (DELTASONE) 20 MG TABLET    Take 20  mg by mouth daily with breakfast.    ROPINIROLE (REQUIP) 0.5 MG TABLET    Take 0.5 mg by mouth at bedtime. 2100   SENNOSIDES-DOCUSATE SODIUM (SENOKOT-S) 8.6-50 MG TABLET    Take 2 tablets by mouth at bedtime. 2100   TORSEMIDE (DEMADEX) 20 MG TABLET    Take 20 mg by mouth 2 (two) times daily.   TRAMADOL-ACETAMINOPHEN (ULTRACET) 37.5-325 MG TABLET    Take 1-2 tablets by mouth every 6 (six) hours as needed for moderate pain.   TRAZODONE (DESYREL) 50 MG TABLET    Take 25 mg by mouth at bedtime.   VITAMIN C (ASCORBIC ACID) 500 MG TABLET    Take 500 mg by mouth 2 (two) times daily. 0900 and 2100   ZINC SULFATE (ZINC-220 PO)    Take 1 tablet by mouth daily.  Modified Medications   No medications on file  Discontinued Medications   No medications on file     SIGNIFICANT DIAGNOSTIC EXAMS   10-12-15: chest x-ray: Enlarged cardiac silhouette. Pulmonary vascular congestion.  10-18-15: EEG: This is a normal EEG for the patients stated age.  There were no focal, hemispheric or lateralizing features.  No epileptiform activity was recorded.  A normal EEG does not exclude the diagnosis of a seizure disorder and if seizure remains high on the list of differential diagnosis, an ambulatory EEG may be of value.  Clinical correlation is required.  01-11-16: chest x-ray: Minimal left base atelectasis.  Otherwise, normal exam.  01-11-16: ct of head: No acute intracranial abnormality. Stable mild cerebral atrophy and chronic white matter disease. No definite acute cortical infarction.  01-11-16: scrotal ultrasound: 1. Left epididymo-orchitis. 2. Microlithiasis involving the left testicle. 3. Three adjacent complex cysts/spermatoceles involving the left epididymal head.   05-27-16: chest x-ray; Low lung volumes and bibasilar atelectasis. No change.  05-28-16; ct of chest: 1. Chest CT findings are most suggestive of a cavitary right lower lobe  pneumonia. 2. Small parapneumonic right pleural effusion with mild compressive atelectasis in the dependent right lung. 3. New mild right paratracheal and subcarinal lymphadenopathy, nonspecific, possibly reactive. 4. A follow-up chest CT with IV contrast is recommended in 3 months to document resolution of these findings. 5. New mild cardiomegaly.  Trace pericardial effusion/thickening. 6. Aortic atherosclerosis.  Three-vessel coronary atherosclerosis.   05-28-16: TEE: - Left ventricle: The cavity size was normal. There was severe concentric hypertrophy. Systolic function was normal. Wall motion was normal; there were no regional wall motion abnormalities. There was fusion of early and atrial contributions to ventricular filling. The study is not technically sufficient to allow evaluation of LV diastolic function. - Aortic valve: Valve mobility was restricted. There was mild to moderate stenosis.  - Mitral valve: Moderately calcified annulus. - Left atrium: The atrium was moderately dilated.  06-01-16: chest x-ray; No significant interval change in right effusion and lower lobe consolidation.   06-05-16: chest x-ray: right lung infiltrate     LABS REVIEWED:   09-10-15: hgb a1c 10.4 09-15-15; tsh 2.954 10-12-15; wbc 5.9; hgb 11.8; hct 36.1; mcv 93.5; pt 195; glucose 187; bun 8; creat 0.78; k+ 5.0; na++136 11-03-15: wbc 9.2; hgb 12.7; hct 39.7; mcv 89.3; plt 267; glucose 105; bun 17.2; creat 0.61; k+ 4.2; na++135  12-29-15: chol 180; ldl 115; trig 66; hdl 52; hgb a1c 8.8  01-11-16: wbc 19.6; hgb 13.6; hct 40.1; mcv 87.6; plt 232; glucose 255; bun 15; creat 0.67; k+ 4.7; na++132; liver normal albumin 2.9; blood culture: no growth; urine culture:  e-coli-esbl; klebsiella pneumoniae  01-15-16: wbc 7.4; hgb 11.6; hct 35.7; mcv 89.9; plt 242; glucose 159; bun 5; creat 0.53; k+ 4.1; na++135  05-16-16: ANA: neg  05-27-16: wbc 29.3; hgb 13.6; hct 40.1; mcv 88.9 plt 227; glucose 587; bun 21; creat 1.12; k+ 4.9;  na++ 127; alk phos 138; albumin 2.8; BNP 141.6; hgb a1c 11.7; urine culture; multiple bacteria; blood culture: MRSA; d-dimer: 0.38; cortisol 40.5 05-29-16: wbc 17.4; hgb 11.2; hct 34.7; mcv 90.6 ;plt 214; glucose 339; bun 26; creat 1.10; k+ 4.2; na++ 131; alk phos 209; ast 45; albumin 1.9; urine culture: MRSA  06-05-16: wbc 14.6; hgb 10.2; hct 32.9; mcv 89.4 plt 392; glucose 119; bun 14.2; creat 0.79; k+ 4.3; na++ 136; alk phos 198; albumin 2.2  06-12-16: wbc 12.2; hgb 9.8; hvt 32.1 ;mcv 89.0; plt 431; glucose 256; bun 16.6; creat 0.73; k+ 3.8; na++ 138; liver normal albumin 2.6         Review of Systems  Constitutional: Negative for malaise/fatigue.  Respiratory: Negative for cough and shortness of breath.   Cardiovascular: Positive for leg swelling. Negative for chest pain and palpitations.  Gastrointestinal: Negative for heartburn, abdominal pain and constipation.  Musculoskeletal: right thigh pain   Skin: sores are improving.   Neurological: Negative for dizziness.  Psychiatric/Behavioral: The patient is not nervous/anxious.        Physical Exam  Constitutional: He is oriented to person, place, and time. No distress.  Obese   Eyes: Conjunctivae are normal.  Neck: Neck supple. No JVD present. No thyromegaly present.  Cardiovascular: Normal rate, regular rhythm and intact distal pulses.   Respiratory: Effort normal and breath sounds normal. No respiratory distress. He has no wheezes.  GI: Soft. Bowel sounds are normal. He exhibits no distension. There is no tenderness.  Genitourinary:  Has foley   Musculoskeletal: He exhibits edema.  Able to move all extremities Trace bilateral lower extremity edema     Lymphadenopathy:    He has no cervical adenopathy.  Neurological: alert  Skin: Skin is warm and dry. He is not diaphoretic. His skin is improving       ASSESSMENT/ PLAN:  1. Diabetes: hgb a1c 11.7: will check cbg after meals; will change novolog to 30 units with an  additional 8 units for cbg >150 after meals. Will change tuojeo to 100 units nightly  Will check urine for micro-albumin     MD is aware of resident's narcotic use and is in agreement with current plan of care. We will attempt to wean resident as appropriate.     Ok Edwards NP [facility]  Contact [phone] Monday through Friday 8am- 5pm  After hours call [phone]

## 2016-07-16 DIAGNOSIS — E08311 Diabetes mellitus due to underlying condition with unspecified diabetic retinopathy with macular edema: Secondary | ICD-10-CM | POA: Diagnosis not present

## 2016-07-16 DIAGNOSIS — I10 Essential (primary) hypertension: Secondary | ICD-10-CM | POA: Diagnosis not present

## 2016-07-16 DIAGNOSIS — I82891 Chronic embolism and thrombosis of other specified veins: Secondary | ICD-10-CM | POA: Diagnosis not present

## 2016-07-16 DIAGNOSIS — A419 Sepsis, unspecified organism: Secondary | ICD-10-CM | POA: Diagnosis not present

## 2016-07-16 DIAGNOSIS — E1169 Type 2 diabetes mellitus with other specified complication: Secondary | ICD-10-CM | POA: Diagnosis not present

## 2016-07-18 DIAGNOSIS — Z79899 Other long term (current) drug therapy: Secondary | ICD-10-CM | POA: Diagnosis not present

## 2016-07-19 DIAGNOSIS — L89523 Pressure ulcer of left ankle, stage 3: Secondary | ICD-10-CM | POA: Diagnosis not present

## 2016-07-19 DIAGNOSIS — L98419 Non-pressure chronic ulcer of buttock with unspecified severity: Secondary | ICD-10-CM | POA: Diagnosis not present

## 2016-07-19 DIAGNOSIS — L988 Other specified disorders of the skin and subcutaneous tissue: Secondary | ICD-10-CM | POA: Diagnosis not present

## 2016-07-20 DIAGNOSIS — E08311 Diabetes mellitus due to underlying condition with unspecified diabetic retinopathy with macular edema: Secondary | ICD-10-CM | POA: Diagnosis not present

## 2016-07-20 DIAGNOSIS — E1169 Type 2 diabetes mellitus with other specified complication: Secondary | ICD-10-CM | POA: Diagnosis not present

## 2016-07-20 DIAGNOSIS — A419 Sepsis, unspecified organism: Secondary | ICD-10-CM | POA: Diagnosis not present

## 2016-07-20 DIAGNOSIS — I10 Essential (primary) hypertension: Secondary | ICD-10-CM | POA: Diagnosis not present

## 2016-07-27 ENCOUNTER — Non-Acute Institutional Stay (SKILLED_NURSING_FACILITY): Payer: Medicare Other | Admitting: Adult Health

## 2016-07-27 ENCOUNTER — Encounter: Payer: Self-pay | Admitting: Adult Health

## 2016-07-27 DIAGNOSIS — Z978 Presence of other specified devices: Secondary | ICD-10-CM

## 2016-07-27 DIAGNOSIS — E1165 Type 2 diabetes mellitus with hyperglycemia: Secondary | ICD-10-CM

## 2016-07-27 DIAGNOSIS — L109 Pemphigus, unspecified: Secondary | ICD-10-CM | POA: Diagnosis not present

## 2016-07-27 DIAGNOSIS — N319 Neuromuscular dysfunction of bladder, unspecified: Secondary | ICD-10-CM

## 2016-07-27 DIAGNOSIS — E1143 Type 2 diabetes mellitus with diabetic autonomic (poly)neuropathy: Secondary | ICD-10-CM

## 2016-07-27 DIAGNOSIS — F329 Major depressive disorder, single episode, unspecified: Secondary | ICD-10-CM | POA: Diagnosis not present

## 2016-07-27 DIAGNOSIS — I5032 Chronic diastolic (congestive) heart failure: Secondary | ICD-10-CM

## 2016-07-27 DIAGNOSIS — IMO0002 Reserved for concepts with insufficient information to code with codable children: Secondary | ICD-10-CM

## 2016-07-27 DIAGNOSIS — Z8673 Personal history of transient ischemic attack (TIA), and cerebral infarction without residual deficits: Secondary | ICD-10-CM | POA: Diagnosis not present

## 2016-07-27 DIAGNOSIS — I110 Hypertensive heart disease with heart failure: Secondary | ICD-10-CM

## 2016-07-27 DIAGNOSIS — L89523 Pressure ulcer of left ankle, stage 3: Secondary | ICD-10-CM | POA: Diagnosis not present

## 2016-07-27 DIAGNOSIS — I2510 Atherosclerotic heart disease of native coronary artery without angina pectoris: Secondary | ICD-10-CM | POA: Diagnosis not present

## 2016-07-27 DIAGNOSIS — L98419 Non-pressure chronic ulcer of buttock with unspecified severity: Secondary | ICD-10-CM | POA: Diagnosis not present

## 2016-07-27 DIAGNOSIS — F32A Depression, unspecified: Secondary | ICD-10-CM

## 2016-07-27 DIAGNOSIS — Z960 Presence of urogenital implants: Secondary | ICD-10-CM

## 2016-07-27 DIAGNOSIS — E1141 Type 2 diabetes mellitus with diabetic mononeuropathy: Secondary | ICD-10-CM | POA: Diagnosis not present

## 2016-07-27 DIAGNOSIS — Z9289 Personal history of other medical treatment: Secondary | ICD-10-CM

## 2016-07-27 DIAGNOSIS — L22 Diaper dermatitis: Secondary | ICD-10-CM | POA: Diagnosis not present

## 2016-07-27 NOTE — Progress Notes (Signed)
Patient ID: [name], male   DOB: 1946-06-06, 70 y.o.   MRN: [id number]    Location:   [place] Room Number: 216-B Place of Service:  SNF (31)   CODE STATUS: Full Code  Allergies  Allergen Reactions  . Ace Inhibitors Swelling    Pt tolerates lisinopril  . Lipitor [Atorvastatin Calcium] Swelling  . Metformin And Related Swelling  . Cefadroxil Hives  . Cephalexin Hives  . Morphine And Related Other (See Comments)    Sweating, feels like is "in rocky boat."  . Robaxin [Methocarbamol] Other (See Comments)    Feels like he is shaky    Chief Complaint  Patient presents with  . Medical Management of Chronic Issues    Follow up    HPI:  He is a long term resident of this facility being seen for the management of his chronic illnesses. He remains in bed per his choice. His cbg's remain elevated and his pemphigoid rash slowly improves. There are no nursing concerns at this time.    Past Medical History:  Diagnosis Date  . Arthritis    "hands, ankles, knees" (11/10/2014)  . Bladder spasms   . CAD (coronary artery disease)   . CAD in native artery 09/15/2015  . CHF (congestive heart failure) ([place])   . Chronic indwelling Foley catheter   . Chronic lower back pain   . Depression   . Diabetic diarrhea ([place])   . Diabetic neuropathy, painful ([place])   . DVT (deep venous thrombosis) ([facility]) 1980's   LLE  . GERD (gastroesophageal reflux disease)   . Hyperlipidemia   . Hypertension   . Neurogenic bladder   . Obese   . OSA (obstructive sleep apnea)    "they wanted me to wear a mask; I couldn't" (11/10/2014)  . Pneumonia 1999  . PONV (postoperative nausea and vomiting)   . Stroke [facility]) 10/2011; 06/2014   right hand numbness/notes 10/20/2011, pt not sure he had stroke in 2013; "right hand weaker and right face not quite right since" (11/10/2014)  . Type II diabetes mellitus ([place])     Past Surgical History:  Procedure Laterality Date  . CARDIAC CATHETERIZATION N/A  05/02/2015   Procedure: Left Heart Cath and Coronary Angiography;  Surgeon: Lorretta Harp, MD;  Location: [place] CV LAB;  Service: Cardiovascular;  Laterality: N/A;  . CARDIAC CATHETERIZATION N/A 05/04/2015   Procedure: Left Heart Cath and Coronary Angiography;  Surgeon: Wellington Hampshire, MD;  Location: [place] CV LAB;  Service: Cardiovascular;  Laterality: N/A;  . CARPAL TUNNEL RELEASE Bilateral ~ 2003-2004  . CHOLECYSTECTOMY OPEN  1970's?  Marland Kitchen GASTROPLASTY    . JOINT REPLACEMENT    . LEFT HEART CATHETERIZATION WITH CORONARY ANGIOGRAM N/A 10/24/2011   Procedure: LEFT HEART CATHETERIZATION WITH CORONARY ANGIOGRAM;  Surgeon: Candee Furbish, MD;  Location: [facility] CATH LAB;  Service: Cardiovascular;  Laterality: N/A;  right radial artery approach  . TOTAL KNEE ARTHROPLASTY Right 1990's    Social History   Social History  . Marital status: Widowed    Spouse name: N/A  . Number of children: N/A  . Years of education: N/A   Occupational History  . Retired    Social History Main Topics  . Smoking status: Former Smoker    Packs/day: 2.00    Years: 20.00    Types: Cigarettes    Quit date: 09/10/1976  . Smokeless tobacco: Never Used  . Alcohol use No     Comment: FORMER ALCOHOLIC; "  sober since 08/08/1982"  . Drug use: No  . Sexual activity: No   Other Topics Concern  . Not on file   Social History Narrative   Lives alone.  No close family nearby.     Family History  Problem Relation Age of Onset  . Diabetes type I Father   . Hypertension Mother   . Cancer Brother      Testicular Cancer  . Cancer Brother     Leukemia      VITAL SIGNS BP 138/77   Pulse 76   Temp 97.2 F (36.2 C) (Oral)   Resp 18   Ht 6\' 1"  (1.854 m)   Wt (!) 343 lb 1 oz (155.6 kg)   SpO2 94%   BMI 45.26 kg/m   Patient's Medications  New Prescriptions   No medications on file  Previous Medications   ALUMINUM-MAGNESIUM HYDROXIDE-SIMETHICONE (MAALOX) 671-245-80 MG/5ML SUSP    Take 30 mLs by mouth  every 6 (six) hours as needed.   AMLODIPINE (NORVASC) 5 MG TABLET    Take 5 mg by mouth daily.   ASPIRIN EC 81 MG TABLET    Take 81 mg by mouth daily. 0900   ESCITALOPRAM (LEXAPRO) 10 MG TABLET    Take 10 mg by mouth daily.    FOLIC ACID (FOLVITE) 1 MG TABLET    Take 1 mg by mouth daily.   GABAPENTIN (NEURONTIN) 100 MG CAPSULE    Take 1 capsule (100 mg total) by mouth 2 (two) times daily.   GUAIFENESIN (MUCINEX) 600 MG 12 HR TABLET    Take 1 tablet (600 mg total) by mouth 2 (two) times daily.   HYDROXYZINE (ATARAX/VISTARIL) 25 MG TABLET    Take 25 mg by mouth 4 (four) times daily.    INSULIN ASPART (NOVOLOG) 100 UNIT/ML INJECTION    Inject 30 Units into the skin 3 (three) times daily before meals.    INSULIN ASPART (NOVOLOG) 100 UNIT/ML INJECTION    Inject into the skin. Inject as per sliding scale: if  0-59=0 Call MD ; 60-150 = 0 units, 151-400= 8 units. 401 + Call MD for CBG 450, subcutaneously before meals related to DM.   INSULIN GLARGINE (TOUJEO SOLOSTAR) 300 UNIT/ML SOPN    Inject 100 Units into the skin at bedtime.   IPRATROPIUM-ALBUTEROL (DUONEB) 0.5-2.5 (3) MG/3ML SOLN    Take 3 mLs by nebulization every 4 (four) hours as needed.   MAGNESIUM OXIDE (MAG-OX) 400 MG TABLET    Take 400 mg by mouth daily. 0900   METHOTREXATE (RHEUMATREX) 7.5 MG TABLET    Take 7.5 mg by mouth once a week. Caution" Chemotherapy. Protect from light. On Wednesday   METOPROLOL TARTRATE (LOPRESSOR) 25 MG TABLET    0.5 tablet by mouth two times a day 0900 , 2100   METRONIDAZOLE (FLAGYL) 500 MG TABLET    Take 500 mg by mouth 3 (three) times daily.   MULTIPLE VITAMINS-MINERALS (DECUBI-VITE) CAPS    Take 1 capsule by mouth daily at 3 pm.   NITROGLYCERIN (NITROSTAT) 0.4 MG SL TABLET    Place 0.4 mg under the tongue every 5 (five) minutes as needed for chest pain.   OMEPRAZOLE (PRILOSEC) 40 MG CAPSULE    Take 40 mg by mouth daily. 0600   POTASSIUM CHLORIDE (KLOR-CON) 20 MEQ PACKET    Take 40 mEq by mouth daily. 0800    PREDNISONE (DELTASONE) 20 MG TABLET    Take 20 mg by mouth daily with breakfast.  ROPINIROLE (REQUIP) 0.5 MG TABLET    Take 0.5 mg by mouth at bedtime. 2100   SENNOSIDES-DOCUSATE SODIUM (SENOKOT-S) 8.6-50 MG TABLET    Take 2 tablets by mouth at bedtime. 2100   TORSEMIDE (DEMADEX) 20 MG TABLET    Take 20 mg by mouth 2 (two) times daily.   TRAMADOL-ACETAMINOPHEN (ULTRACET) 37.5-325 MG TABLET    Take 1-2 tablets by mouth every 6 (six) hours as needed for moderate pain.   TRAZODONE (DESYREL) 50 MG TABLET    Take 50 mg by mouth at bedtime.    VITAMIN C (ASCORBIC ACID) 500 MG TABLET    Take 500 mg by mouth 2 (two) times daily. 0900 and 2100  Modified Medications   No medications on file  Discontinued Medications   No medications on file     SIGNIFICANT DIAGNOSTIC EXAMS  10-12-15: chest x-ray: Enlarged cardiac silhouette. Pulmonary vascular congestion.  10-18-15: EEG: This is a normal EEG for the patients stated age.  There were no focal, hemispheric or lateralizing features.  No epileptiform activity was recorded.  A normal EEG does not exclude the diagnosis of a seizure disorder and if seizure remains high on the list of differential diagnosis, an ambulatory EEG may be of value.  Clinical correlation is required.  01-11-16: chest x-ray: Minimal left base atelectasis.  Otherwise, normal exam.  01-11-16: ct of head: No acute intracranial abnormality. Stable mild cerebral atrophy and chronic white matter disease. No definite acute cortical infarction.  01-11-16: scrotal ultrasound: 1. Left epididymo-orchitis. 2. Microlithiasis involving the left testicle. 3. Three adjacent complex cysts/spermatoceles involving the left epididymal head.  05-28-16: ct of chest: 1. Chest CT findings are most suggestive of a cavitary right lower lobe pneumonia. 2. Small parapneumonic right pleural effusion with mild compressive atelectasis in the dependent right lung. 3. New mild right paratracheal and subcarinal  lymphadenopathy, nonspecific, possibly reactive. 4. A follow-up chest CT with IV contrast is recommended in 3 months to document resolution of these findings. 5. New mild cardiomegaly.  Trace pericardial effusion/thickening. 6. Aortic atherosclerosis.  Three-vessel coronary atherosclerosis.  05-28-16; 2-d echo: - Left ventricle: The cavity size was normal. There was severe concentric hypertrophy. Systolic function was normal. Wall motion was normal; there were no regional wall motion abnormalities. There was fusion of early and atrial contributions to ventricular  filling. The study is not technically sufficient to allow evaluation of LV diastolic function. - Aortic valve: Valve mobility was restricted. There was mild to moderate stenosis. - Mitral valve: Moderately calcified annulus. - Left atrium: The atrium was moderately dilated.      LABS REVIEWED:   09-10-15: hgb a1c 10.4 09-15-15; tsh 2.954 10-12-15; wbc 5.9; hgb 11.8; hct 36.1; mcv 93.5; pt 195; glucose 187; bun 8; creat 0.78; k+ 5.0; na++136 11-03-15: wbc 9.2; hgb 12.7; hct 39.7; mcv 89.3; plt 267; glucose 105; bun 17.2; creat 0.61; k+ 4.2; na++135  12-29-15: chol 180; ldl 115; trig 66; hdl 52; hgb a1c 8.8  01-11-16: wbc 19.6; hgb 13.6; hct 40.1; mcv 87.6; plt 232; glucose 255; bun 15; creat 0.67; k+ 4.7; na++132; liver normal albumin 2.9; blood culture: no growth; urine culture: e-coli-esbl; klebsiella pneumoniae  01-15-16: wbc 7.4; hgb 11.6; hct 35.7; mcv 89.9; plt 242; glucose 159; bun 5; creat 0.53; k+ 4.1; na++135  05-27-16: hgb a1c 11.7  07-26-16: wbc 13.5; hgb 11.5; hct 35.8; mcv 91.8; plt 317; albumin 3.2      Review of Systems  Constitutional: Negative for malaise/fatigue.  Respiratory: Negative  for cough and shortness of breath.   Cardiovascular: Positive for leg swelling. Negative for chest pain and palpitations.  Gastrointestinal: Negative for heartburn, abdominal pain and constipation.  Musculoskeletal: no complaint of pain    Skin: sores are improving.   Neurological: Negative for dizziness.  Psychiatric/Behavioral: The patient is not nervous/anxious.        Physical Exam  Constitutional: He is oriented to person, place, and time. No distress.  Obese   Eyes: Conjunctivae are normal.  Neck: Neck supple. No JVD present. No thyromegaly present.  Cardiovascular: Normal rate, regular rhythm and intact distal pulses.   Respiratory: Effort normal and breath sounds normal. No respiratory distress. He has no wheezes.  GI: Soft. Bowel sounds are normal. He exhibits no distension. There is no tenderness.  Genitourinary:  Has foley   Musculoskeletal: He exhibits edema.  Able to move all extremities Trace bilateral lower extremity edema     Lymphadenopathy:    He has no cervical adenopathy.  Neurological: alert  Skin: Skin is warm and dry. He is not diaphoretic. 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 Hypertension: will continue norvasc 5 mg daily  Lopressor 25 mg twice daily asa 81 mg daily   3. Gerd: will continue prilosec 40 mg daily   4.  Diastolic heart failure:  2-d done 05-28-16: will continue demadex 20 mg twice daily with k+ 40 meq daily  Will monitor  5. Diabetes: hgb a1c 11.7; will change tuojeo to 110 units nightly will continue novolog 30 units after meals with an additional 5 units for cbg >=150; will monitor   6.  OSA: is on cpap;   7. Diabetic neuropathy: will continue neurontin 100 mg twice daily   8. Depression will continue lexapro 10 mg daily  9.  History of CVA: is neurologically stable; will continue asa 81 mg daily   10.  Allergic rhinitis: will continue mucinex 600 mg twice daily duoneb four times daily as needed   11. RLS: will continue requip 0.5 mg nightly   12. CAD: no  complaint of chest pain: will continue asa 81 mg daily and prn ntg  13. Constipation: will continue senna s nightly    MD is aware of resident's narcotic use and is in agreement with current plan of care. We will attempt to wean resident as appropriate.     Ok Edwards NP [facility]  Contact [phone] Monday through Friday 8am- 5pm  After hours call [phone]

## 2016-07-30 DIAGNOSIS — I10 Essential (primary) hypertension: Secondary | ICD-10-CM | POA: Diagnosis not present

## 2016-07-30 DIAGNOSIS — I5030 Unspecified diastolic (congestive) heart failure: Secondary | ICD-10-CM | POA: Diagnosis not present

## 2016-07-30 DIAGNOSIS — E1169 Type 2 diabetes mellitus with other specified complication: Secondary | ICD-10-CM | POA: Diagnosis not present

## 2016-07-30 LAB — CBC AND DIFFERENTIAL
HEMATOCRIT: 38 % — AB (ref 41–53)
Hemoglobin: 12.4 g/dL — AB (ref 13.5–17.5)
Platelets: 289 10*3/uL (ref 150–399)
WBC: 15.1 10^3/mL

## 2016-07-30 LAB — BASIC METABOLIC PANEL
BUN: 28 mg/dL — AB (ref 4–21)
CREATININE: 0.9 mg/dL (ref 0.6–1.3)
Glucose: 323 mg/dL
Potassium: 4.3 mmol/L (ref 3.4–5.3)
SODIUM: 137 mmol/L (ref 137–147)

## 2016-07-30 LAB — HEPATIC FUNCTION PANEL
ALK PHOS: 102 U/L (ref 25–125)
ALT: 14 U/L (ref 10–40)
AST: 11 U/L — AB (ref 14–40)
BILIRUBIN, TOTAL: 0.2 mg/dL

## 2016-08-01 DIAGNOSIS — L89523 Pressure ulcer of left ankle, stage 3: Secondary | ICD-10-CM | POA: Diagnosis not present

## 2016-08-01 DIAGNOSIS — L259 Unspecified contact dermatitis, unspecified cause: Secondary | ICD-10-CM | POA: Diagnosis not present

## 2016-08-01 DIAGNOSIS — L988 Other specified disorders of the skin and subcutaneous tissue: Secondary | ICD-10-CM | POA: Diagnosis not present

## 2016-08-01 DIAGNOSIS — L98419 Non-pressure chronic ulcer of buttock with unspecified severity: Secondary | ICD-10-CM | POA: Diagnosis not present

## 2016-08-08 ENCOUNTER — Non-Acute Institutional Stay (SKILLED_NURSING_FACILITY): Payer: Medicare Other | Admitting: Adult Health

## 2016-08-08 ENCOUNTER — Encounter: Payer: Self-pay | Admitting: Adult Health

## 2016-08-08 DIAGNOSIS — Z794 Long term (current) use of insulin: Secondary | ICD-10-CM | POA: Diagnosis not present

## 2016-08-08 DIAGNOSIS — E0800 Diabetes mellitus due to underlying condition with hyperosmolarity without nonketotic hyperglycemic-hyperosmolar coma (NKHHC): Secondary | ICD-10-CM | POA: Diagnosis not present

## 2016-08-08 NOTE — Progress Notes (Signed)
Patient ID: [name], male   DOB: [birth year], 71 y.o.   MRN: [id number]   Location:   [place] Room Number: 232-B Place of Service:  SNF (31)   CODE STATUS: Full Code  Allergies  Allergen Reactions  . Ace Inhibitors Swelling    Pt tolerates lisinopril  . Lipitor [Atorvastatin Calcium] Swelling  . Metformin And Related Swelling  . Cefadroxil Hives  . Cephalexin Hives  . Morphine And Related Other (See Comments)    Sweating, feels like is "in rocky boat."  . Robaxin [Methocarbamol] Other (See Comments)    Feels like he is shaky    Chief Complaint  Patient presents with  . Acute Visit    Diabetes    HPI:  I have been asked to see him regarding his diabetes. His cbg's remain out of control and elevated. He denies any symptoms associated with the elevated readings. He does not adhere to a low carb diet. He is taking his insulin most of the time.   Past Medical History:  Diagnosis Date  . Arthritis    "hands, ankles, knees" (11/10/2014)  . Bladder spasms   . CAD (coronary artery disease)   . CAD in native artery 09/15/2015  . CHF (congestive heart failure) ([place])   . Chronic indwelling Foley catheter   . Chronic lower back pain   . Depression   . Diabetic diarrhea ([place])   . Diabetic neuropathy, painful ([place])   . DVT (deep venous thrombosis) ([facility]) 1980's   LLE  . GERD (gastroesophageal reflux disease)   . Hyperlipidemia   . Hypertension   . Neurogenic bladder   . Obese   . OSA (obstructive sleep apnea)    "they wanted me to wear a mask; I couldn't" (11/10/2014)  . Pneumonia 1999  . PONV (postoperative nausea and vomiting)   . Stroke [facility]) 10/2011; 06/2014   right hand numbness/notes 10/20/2011, pt not sure he had stroke in 2013; "right hand weaker and right face not quite right since" (11/10/2014)  . Type II diabetes mellitus ([place])     Past Surgical History:  Procedure Laterality Date  . CARDIAC CATHETERIZATION N/A 05/02/2015   Procedure: Left Heart  Cath and Coronary Angiography;  Surgeon: Lorretta Harp, MD;  Location: [place] CV LAB;  Service: Cardiovascular;  Laterality: N/A;  . CARDIAC CATHETERIZATION N/A 05/04/2015   Procedure: Left Heart Cath and Coronary Angiography;  Surgeon: Wellington Hampshire, MD;  Location: [place] CV LAB;  Service: Cardiovascular;  Laterality: N/A;  . CARPAL TUNNEL RELEASE Bilateral ~ 2003-2004  . CHOLECYSTECTOMY OPEN  1970's?  [facility] Kitchen GASTROPLASTY    . JOINT REPLACEMENT    . LEFT HEART CATHETERIZATION WITH CORONARY ANGIOGRAM N/A 10/24/2011   Procedure: LEFT HEART CATHETERIZATION WITH CORONARY ANGIOGRAM;  Surgeon: Candee Furbish, MD;  Location: [facility] CATH LAB;  Service: Cardiovascular;  Laterality: N/A;  right radial artery approach  . TOTAL KNEE ARTHROPLASTY Right 1990's    Social History   Social History  . Marital status: Widowed    Spouse name: N/A  . Number of children: N/A  . Years of education: N/A   Occupational History  . Retired    Social History Main Topics  . Smoking status: Former Smoker    Packs/day: 2.00    Years: 20.00    Types: Cigarettes    Quit date: 09/10/1976  . Smokeless tobacco: Never Used  . 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  Drug use: No  . Sexual activity: No   Other Topics Concern  . Not on file   Social History Narrative   Lives alone.  No close family nearby.     Family History  Problem Relation Age of Onset  . Diabetes type I Father   . Hypertension Mother   . Cancer Brother      Testicular Cancer  . Cancer Brother     Leukemia      VITAL SIGNS BP 136/66   Pulse 85   Temp 97.6 F (36.4 C) (Oral)   Resp 17   Ht 6\' 1"  (1.854 m)   Wt (!) 343 lb (155.6 kg)   SpO2 97%   BMI 45.25 kg/m   Patient's Medications  New Prescriptions   No medications on file  Previous Medications   ALUMINUM-MAGNESIUM HYDROXIDE-SIMETHICONE (MAALOX) 119-417-40 MG/5ML SUSP    Take 30 mLs by mouth every 6 (six) hours as needed.    AMLODIPINE (NORVASC) 5 MG TABLET    Take 5 mg by mouth daily.   ASPIRIN EC 81 MG TABLET    Take 81 mg by mouth daily. 0900   ESCITALOPRAM (LEXAPRO) 10 MG TABLET    Take 10 mg by mouth daily.    FOLIC ACID (FOLVITE) 1 MG TABLET    Take 1 mg by mouth daily.   GABAPENTIN (NEURONTIN) 100 MG CAPSULE    Take 1 capsule (100 mg total) by mouth 2 (two) times daily.   HYDROXYZINE (ATARAX/VISTARIL) 25 MG TABLET    Take 25 mg by mouth 4 (four) times daily.    INSULIN ASPART (NOVOLOG) 100 UNIT/ML INJECTION    Inject 30 Units into the skin 3 (three) times daily before meals.    INSULIN ASPART (NOVOLOG) 100 UNIT/ML INJECTION    Inject into the skin. Inject as per sliding scale: if  0-59=0 Call MD ; 60-150 = 0 units, 151-400= 8 units; 401 +.  subcutaneously before meals related to DM.   INSULIN GLARGINE (TOUJEO SOLOSTAR) 300 UNIT/ML SOPN    Inject 110 Units into the skin at bedtime.    IPRATROPIUM-ALBUTEROL (DUONEB) 0.5-2.5 (3) MG/3ML SOLN    Take 3 mLs by nebulization every 4 (four) hours as needed.   MAGNESIUM OXIDE (MAG-OX) 400 MG TABLET    Take 400 mg by mouth daily. 0900   METHOTREXATE (RHEUMATREX) 7.5 MG TABLET    Take 7.5 mg by mouth once a week. Caution" Chemotherapy. Protect from light. On Wednesday   METOPROLOL TARTRATE (LOPRESSOR) 25 MG TABLET    0.5 tablet by mouth two times a day 0900 , 2100   MULTIPLE VITAMINS-MINERALS (DECUBI-VITE) CAPS    Take 1 capsule by mouth daily at 3 pm.   NITROGLYCERIN (NITROSTAT) 0.4 MG SL TABLET    Place 0.4 mg under the tongue every 5 (five) minutes as needed for chest pain.   OMEPRAZOLE (PRILOSEC) 40 MG CAPSULE    Take 40 mg by mouth daily. 0600   POTASSIUM CHLORIDE (KLOR-CON) 20 MEQ PACKET    Take 40 mEq by mouth daily. 0800   PREDNISONE (DELTASONE) 20 MG TABLET    Take 20 mg by mouth daily with breakfast.    ROPINIROLE (REQUIP) 0.5 MG TABLET    Take 0.5 mg by mouth at bedtime. 2100   SACCHAROMYCES BOULARDII (FLORASTOR) 250 MG CAPSULE    Take 250 mg by mouth 2 (two)  times daily.   SENNOSIDES-DOCUSATE SODIUM (SENOKOT-S) 8.6-50 MG TABLET    Take 2 tablets by mouth  at bedtime. 2100   TORSEMIDE (DEMADEX) 20 MG TABLET    Take 20 mg by mouth 2 (two) times daily.   TRAMADOL-ACETAMINOPHEN (ULTRACET) 37.5-325 MG TABLET    Take 1-2 tablets by mouth every 6 (six) hours as needed for moderate pain.   TRAZODONE (DESYREL) 50 MG TABLET    Take 50 mg by mouth at bedtime.    VITAMIN C (ASCORBIC ACID) 500 MG TABLET    Take 500 mg by mouth 2 (two) times daily. 0900 and 2100  Modified Medications   No medications on file  Discontinued Medications     SIGNIFICANT DIAGNOSTIC EXAMS  10-12-15: chest x-ray: Enlarged cardiac silhouette. Pulmonary vascular congestion.  10-18-15: EEG: This is a normal EEG for the patients stated age.  There were no focal, hemispheric or lateralizing features.  No epileptiform activity was recorded.  A normal EEG does not exclude the diagnosis of a seizure disorder and if seizure remains high on the list of differential diagnosis, an ambulatory EEG may be of value.  Clinical correlation is required.  01-11-16: chest x-ray: Minimal left base atelectasis.  Otherwise, normal exam.  01-11-16: ct of head: No acute intracranial abnormality. Stable mild cerebral atrophy and chronic white matter disease. No definite acute cortical infarction.  01-11-16: scrotal ultrasound: 1. Left epididymo-orchitis. 2. Microlithiasis involving the left testicle. 3. Three adjacent complex cysts/spermatoceles involving the left epididymal head.   LABS REVIEWED:   09-10-15: hgb a1c 10.4 09-15-15; tsh 2.954 10-12-15; wbc 5.9; hgb 11.8; hct 36.1; mcv 93.5; pt 195; glucose 187; bun 8; creat 0.78; k+ 5.0; na++136 11-03-15: wbc 9.2; hgb 12.7; hct 39.7; mcv 89.3; plt 267; glucose 105; bun 17.2; creat 0.61; k+ 4.2; na++135  12-29-15: chol 180; ldl 115; trig 66; hdl 52; hgb a1c 8.8  01-11-16: wbc 19.6; hgb 13.6; hct 40.1; mcv 87.6; plt 232; glucose 255; bun 15; creat 0.67; k+ 4.7; na++132;  liver normal albumin 2.9; blood culture: no growth; urine culture: e-coli-esbl; klebsiella pneumoniae  01-15-16: wbc 7.4; hgb 11.6; hct 35.7; mcv 89.9; plt 242; glucose 159; bun 5; creat 0.53; k+ 4.1; na++135  05-27-16: hgb a1c 11.7 07-20-16: stool for c-diff: +  07-30-16: wbc 15.1; hgb 12.4; hct 37.7; mcv 93.5; plt 289; glucose 322; bun 28.3; creat 0.91; k+ 4.3; na++ 137; liver normal albumin 3.4       Review of Systems  Constitutional: Negative for malaise/fatigue.  Respiratory: Negative for cough and shortness of breath.   Cardiovascular: Positive for leg swelling. Negative for chest pain and palpitations.  Gastrointestinal: Negative for heartburn, abdominal pain and constipation.  Musculoskeletal: right thigh pain   Skin: sores are improving.   Neurological: Negative for dizziness.  Psychiatric/Behavioral: The patient is not nervous/anxious.        Physical Exam  Constitutional: He is oriented to person, place, and time. No distress.  Obese   Eyes: Conjunctivae are normal.  Neck: Neck supple. No JVD present. No thyromegaly present.  Cardiovascular: Normal rate, regular rhythm and intact distal pulses.   Respiratory: Effort normal and breath sounds normal. No respiratory distress. He has no wheezes.  GI: Soft. Bowel sounds are normal. He exhibits no distension. There is no tenderness.  Genitourinary:  Has foley   Musculoskeletal: He exhibits edema.  Able to move all extremities Trace bilateral lower extremity edema     Lymphadenopathy:    He has no cervical adenopathy.  Neurological: alert  Skin: Skin is warm and dry. He is not diaphoretic. His skin is improving  ASSESSMENT/ PLAN:  1. Diabetes:  hgb a1c 11.7: will increase tuojeo to 120 units nightly and will change novolog to 38 units after meals with an additional 8 units for cbg >=150      MD is aware of resident's narcotic use and is in agreement with current plan of care. We will attempt to wean resident as  apropriate   Ok Edwards NP [facility]  Contact [phone] Monday through Friday 8am- 5pm  After hours call [phone]

## 2016-08-09 DIAGNOSIS — L988 Other specified disorders of the skin and subcutaneous tissue: Secondary | ICD-10-CM | POA: Diagnosis not present

## 2016-08-09 DIAGNOSIS — L98419 Non-pressure chronic ulcer of buttock with unspecified severity: Secondary | ICD-10-CM | POA: Diagnosis not present

## 2016-08-09 DIAGNOSIS — L89523 Pressure ulcer of left ankle, stage 3: Secondary | ICD-10-CM | POA: Diagnosis not present

## 2016-08-16 DIAGNOSIS — L988 Other specified disorders of the skin and subcutaneous tissue: Secondary | ICD-10-CM | POA: Diagnosis not present

## 2016-08-16 DIAGNOSIS — L98419 Non-pressure chronic ulcer of buttock with unspecified severity: Secondary | ICD-10-CM | POA: Diagnosis not present

## 2016-08-19 IMAGING — MR MR HEAD W/O CM
10 of 11 series · 36 of 48 positions shown · non-contrast
Comparison: Brain MRI 06/25/2014 and earlier.

CLINICAL DATA: 68-year-old male with new altered mental status.
Initial encounter.

EXAM:
MRI HEAD WITHOUT CONTRAST
TECHNIQUE: Multiplanar, multiecho pulse sequences of the brain and surrounding
structures were obtained without intravenous contrast.

[Series 3: DWI · axial · 3.6mm · 1.02mm/px · z∈[-84,+56]mm · 7 of 80 slices shown (1 of 4)]
[im 1/80]
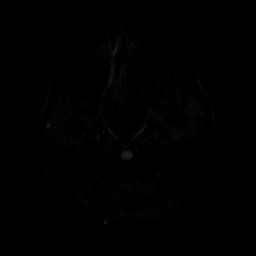
[im 14/80]
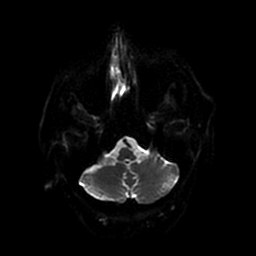
[im 27/80]
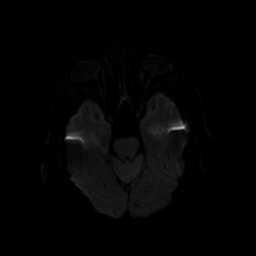
[im 40/80]
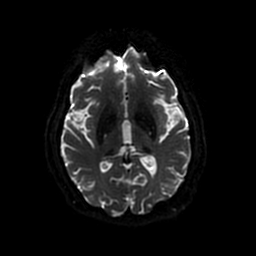
[im 53/80]
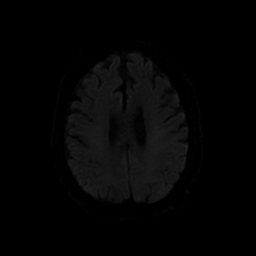
[im 66/80]
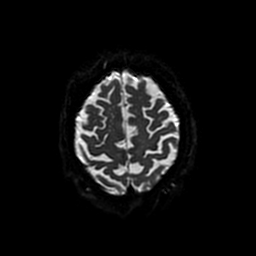
[im 80/80]
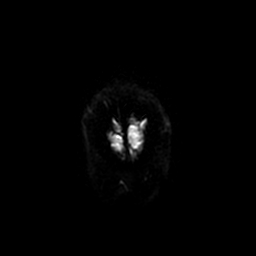

[Series 4: FLAIR · sagittal · 5.0mm · 0.94mm/px · 2 of 22 slices shown (1 of 3)]
[im 1/22]
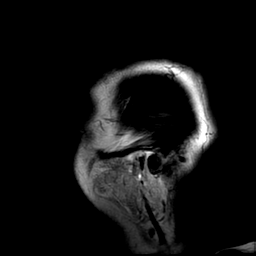
[im 22/22]
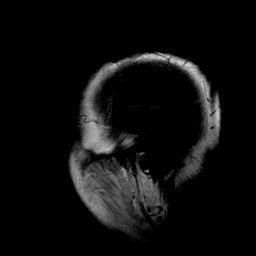

[Series 5: T2 · axial · 5.0mm · 0.43mm/px · z∈[-87,+43]mm · 2 of 23 slices shown (1 of 2)]
[im 1/23]
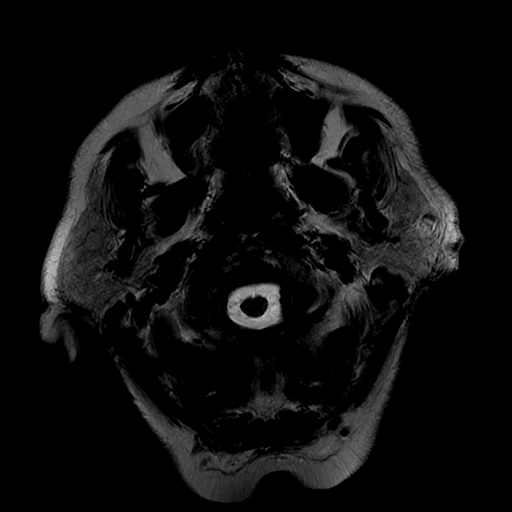
[im 23/23]
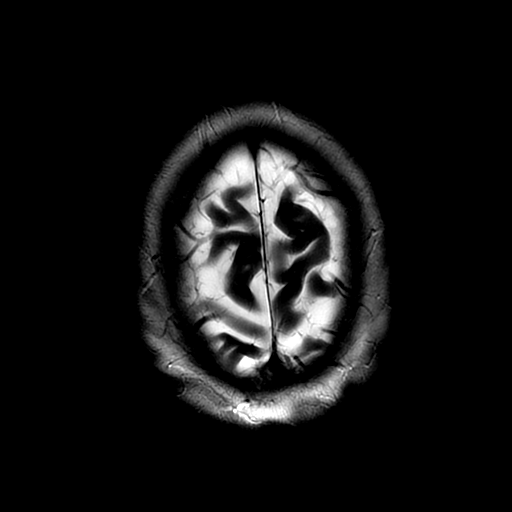

[Series 6: FLAIR · axial · 5.0mm · 0.43mm/px · 1 of 12 slices shown (2 of 3)]
[im 1/12]
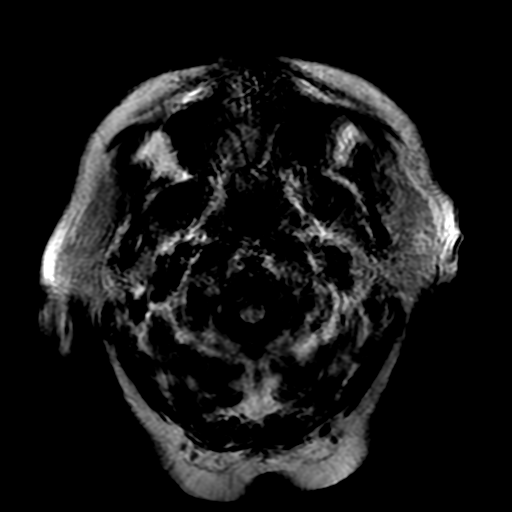

[Series 7: (person_name) · axial · 3.6mm · 0.47mm/px · z∈[-92,+7]mm · 6 of 160 slices shown]
[im 1/160]
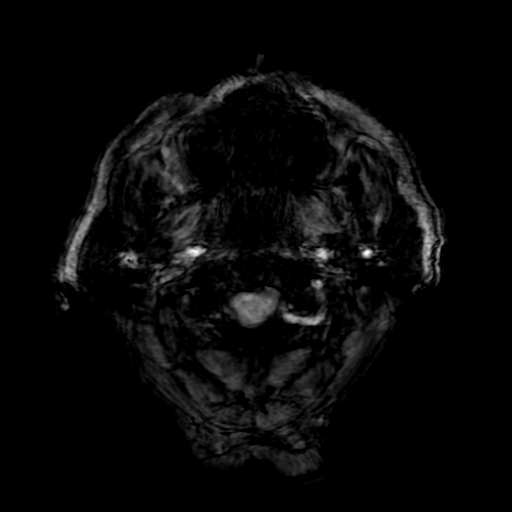
[im 25/160]
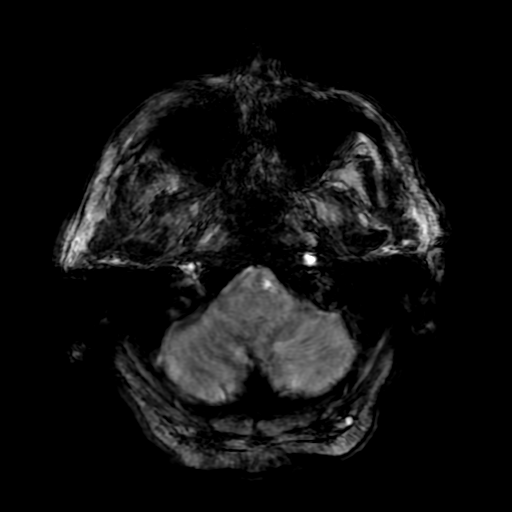
[im 49/160]
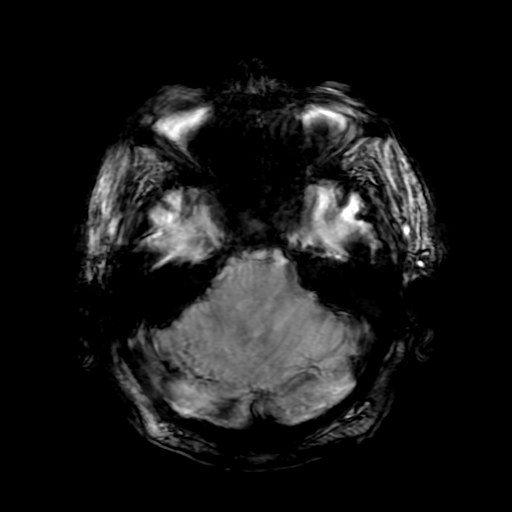
[im 74/160]
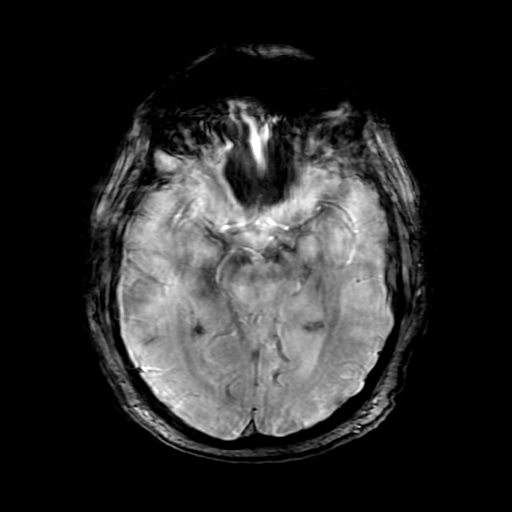
[im 86/160]
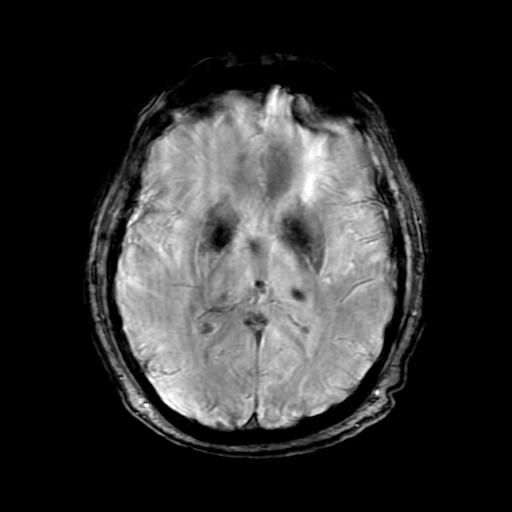
[im 111/160]
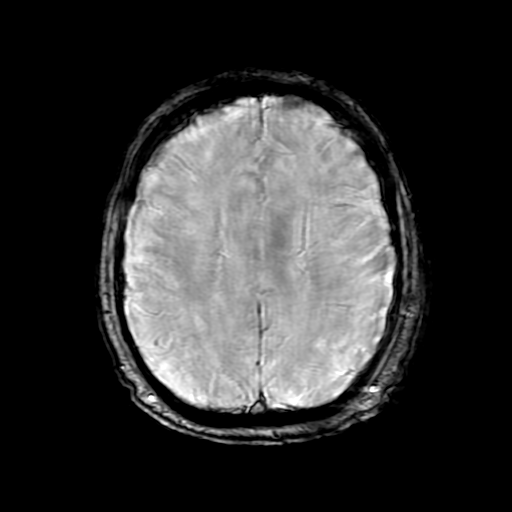

[Series 8: DWI · coronal · 5.0mm · 1.02mm/px · 6 of 68 slices shown (2 of 4)]
[im 1/68]
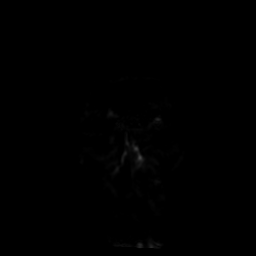
[im 14/68]
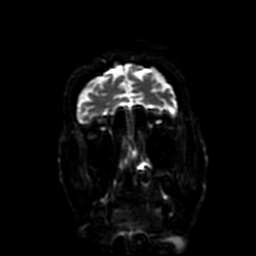
[im 27/68]
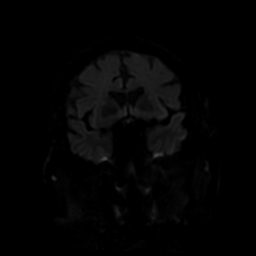
[im 41/68]
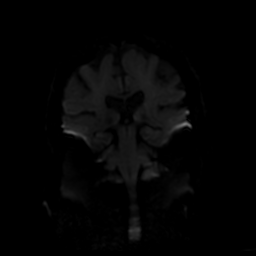
[im 54/68]
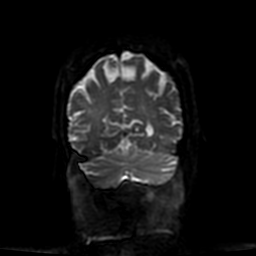
[im 68/68]
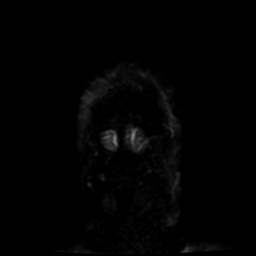

[Series 9: FLAIR · axial · 5.0mm · 0.43mm/px · z∈[-87,+43]mm · 2 of 23 slices shown (3 of 3)]
[im 1/23]
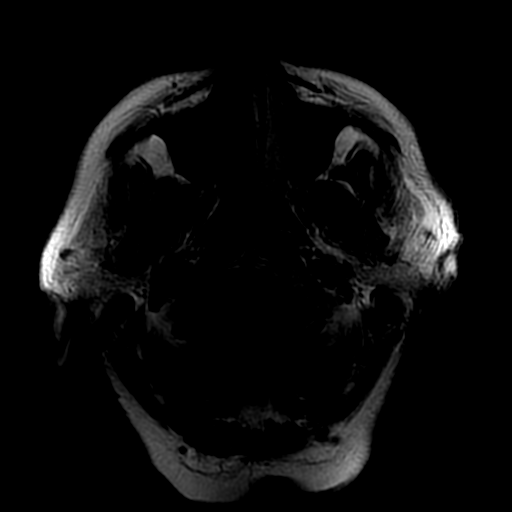
[im 23/23]
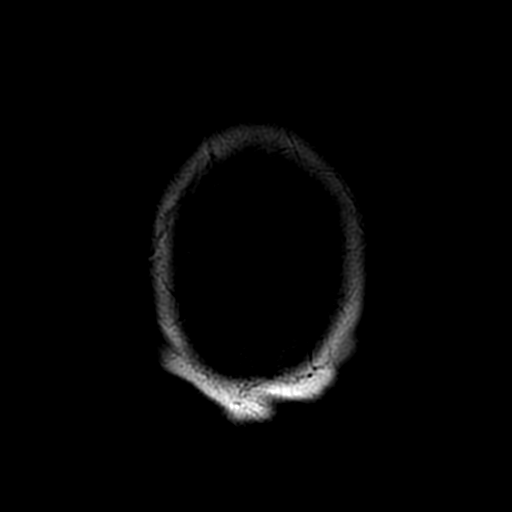

[Series 10: T2 · coronal · 5.0mm · 0.94mm/px · 3 of 28 slices shown (2 of 2)]
[im 1/28]
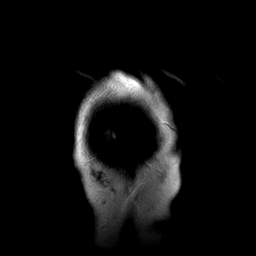
[im 14/28]
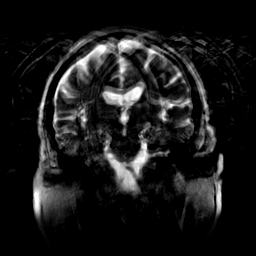
[im 28/28]
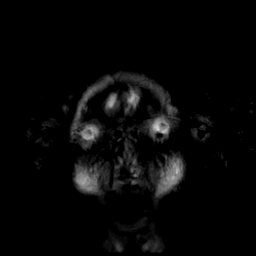

[Series 300: DWI · axial · 3.6mm · 1.02mm/px · z∈[-84,+56]mm · 4 of 40 slices shown (3 of 4)]
[im 1/40]
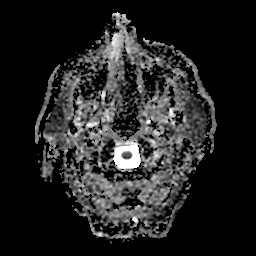
[im 14/40]
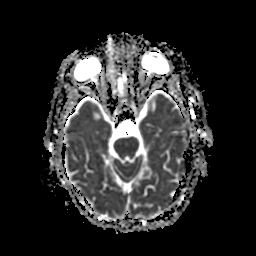
[im 27/40]
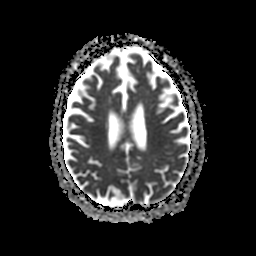
[im 40/40]
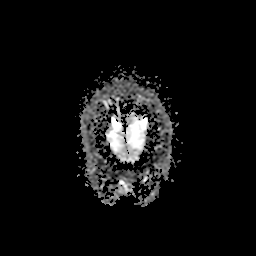

[Series 800: DWI · coronal · 5.0mm · 1.02mm/px · 3 of 34 slices shown (4 of 4)]
[im 1/34]
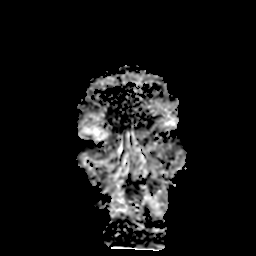
[im 17/34]
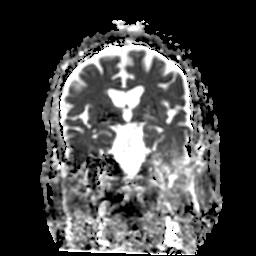
[im 34/34]
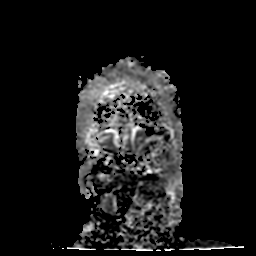

[36 of 48 positions shown; findings below may reference images not displayed]

FINDINGS: Major intracranial vascular flow voids are stable. Stable cerebral
volume. No restricted diffusion to suggest acute infarction. No
midline shift, mass effect, evidence of mass lesion,
ventriculomegaly, extra-axial collection or acute intracranial
hemorrhage. Cervicomedullary junction and pituitary are within
normal limits. Negative visualized cervical spine. Gray and white
matter signal is stable and within normal limits for a age.

Visible internal auditory structures appear normal. Mastoids are
clear. Visualized orbit soft tissues are within normal limits. Trace
paranasal sinus mucosal thickening. Visualized scalp soft tissues
are within normal limits. Bone marrow signal within normal limits.
IMPRESSION: No acute intracranial abnormality. Stable and negative for age non
contrast MRI appearance of the brain.

## 2016-08-22 ENCOUNTER — Encounter: Payer: Self-pay | Admitting: Adult Health

## 2016-08-22 ENCOUNTER — Non-Acute Institutional Stay (SKILLED_NURSING_FACILITY): Payer: Medicare Other | Admitting: Adult Health

## 2016-08-22 DIAGNOSIS — Z9289 Personal history of other medical treatment: Secondary | ICD-10-CM | POA: Diagnosis not present

## 2016-08-22 DIAGNOSIS — I110 Hypertensive heart disease with heart failure: Secondary | ICD-10-CM

## 2016-08-22 DIAGNOSIS — N319 Neuromuscular dysfunction of bladder, unspecified: Secondary | ICD-10-CM | POA: Diagnosis not present

## 2016-08-22 DIAGNOSIS — G2581 Restless legs syndrome: Secondary | ICD-10-CM | POA: Diagnosis not present

## 2016-08-22 DIAGNOSIS — E1141 Type 2 diabetes mellitus with diabetic mononeuropathy: Secondary | ICD-10-CM | POA: Diagnosis not present

## 2016-08-22 DIAGNOSIS — L109 Pemphigus, unspecified: Secondary | ICD-10-CM

## 2016-08-22 DIAGNOSIS — I5032 Chronic diastolic (congestive) heart failure: Secondary | ICD-10-CM

## 2016-08-22 DIAGNOSIS — Z978 Presence of other specified devices: Secondary | ICD-10-CM

## 2016-08-22 DIAGNOSIS — G4733 Obstructive sleep apnea (adult) (pediatric): Secondary | ICD-10-CM

## 2016-08-22 DIAGNOSIS — Z9989 Dependence on other enabling machines and devices: Secondary | ICD-10-CM | POA: Diagnosis not present

## 2016-08-22 DIAGNOSIS — L98419 Non-pressure chronic ulcer of buttock with unspecified severity: Secondary | ICD-10-CM | POA: Diagnosis not present

## 2016-08-22 DIAGNOSIS — Z960 Presence of urogenital implants: Secondary | ICD-10-CM

## 2016-08-22 DIAGNOSIS — L988 Other specified disorders of the skin and subcutaneous tissue: Secondary | ICD-10-CM | POA: Diagnosis not present

## 2016-08-22 NOTE — Progress Notes (Signed)
Patient ID: [name], male   DOB: 1946/04/11, 70 y.o.   MRN: [id number]   Provider:  Ok Edwards, NP Location:  [facility] Room Number: 216-B Place of Service:  SNF (31)   PCP: Irven Shelling, MD Patient Care Team: Lavone Orn, MD as PCP - General (Internal Medicine)  Extended Emergency Contact Information Primary Emergency Contact: Whiting,[name] Address: [address]          [place], [place] [address] [name] of [place] Phone: [phone] Relation: Friend Secondary Emergency Contact: [place] of [place] Phone: [phone] Mobile Phone: [phone] Relation: Brother  Code Status: Full Code Goals of Care: Advanced Directive information Advanced Directives 08/22/2016  Does Patient Have a Medical Advance Directive? No  Type of Advance Directive -  Does patient want to make changes to medical advance directive? -  Copy of [place] in Chart? -  Would patient like information on creating a medical advance directive? -  Pre-existing out of facility DNR order (yellow form or pink MOST form) -      Allergies  Allergen Reactions  . Ace Inhibitors Swelling    Pt tolerates lisinopril  . Lipitor [Atorvastatin Calcium] Swelling  . Metformin And Related Swelling  . Cefadroxil Hives  . Cephalexin Hives  . Morphine And Related Other (See Comments)    Sweating, feels like is "in rocky boat."  . Robaxin [Methocarbamol] Other (See Comments)    Feels like he is shaky     Chief Complaint  Patient presents with  . Annual Exam    Annual    HPI: Patient is a 70 y.o. male seen today for an annual comprehensive examination. He is having issues with his foley. He has developed a penile tear. He is willing to go to the urologist for evaluation and to determine if he requires a permanent foley. His cbg;s remain elevated.    Past Medical History:  Diagnosis Date  . Arthritis    "hands,  ankles, knees" (11/10/2014)  . Bladder spasms   . CAD (coronary artery disease)   . CAD in native artery 09/15/2015  . CHF (congestive heart failure) ([place])   . Chronic indwelling Foley catheter   . Chronic lower back pain   . Depression   . Diabetic diarrhea ([place])   . Diabetic neuropathy, painful ([place])   . DVT (deep venous thrombosis) (HCC) 1980's   LLE  . GERD (gastroesophageal reflux disease)   . Hyperlipidemia   . Hypertension   . Neurogenic bladder   . Obese   . OSA (obstructive sleep apnea)    "they wanted me to wear a mask; I couldn't" (11/10/2014)  . Pneumonia 1999  . PONV (postoperative nausea and vomiting)   . Stroke [facility]) 10/2011; 06/2014   right hand numbness/notes 10/20/2011, pt not sure he had stroke in 2013; "right hand weaker and right face not quite right since" (11/10/2014)  . Type II diabetes mellitus ([place])    Past Surgical History:  Procedure Laterality Date  . CARDIAC CATHETERIZATION N/A 05/02/2015   Procedure: Left Heart Cath and Coronary Angiography;  Surgeon: Lorretta Harp, MD;  Location: [place]  CV LAB;  Service: Cardiovascular;  Laterality: N/A;  . CARDIAC CATHETERIZATION N/A 05/04/2015   Procedure: Left Heart Cath and Coronary Angiography;  Surgeon: Wellington Hampshire, MD;  Location: [place] CV LAB;  Service: Cardiovascular;  Laterality: N/A;  . CARPAL TUNNEL RELEASE Bilateral ~ 2003-2004  . CHOLECYSTECTOMY OPEN  1970's?  Marland Kitchen GASTROPLASTY    . JOINT REPLACEMENT    . LEFT HEART CATHETERIZATION WITH CORONARY ANGIOGRAM N/A 10/24/2011   Procedure: LEFT HEART CATHETERIZATION WITH CORONARY ANGIOGRAM;  Surgeon: Candee Furbish, MD;  Location: [facility] CATH LAB;  Service: Cardiovascular;  Laterality: N/A;  right radial artery approach  . TOTAL KNEE ARTHROPLASTY Right 1990's    reports that he quit smoking about 39 years ago. His smoking use included Cigarettes. He has a 40.00 pack-year smoking history. He has never used smokeless tobacco. He reports that he does not drink  alcohol or use drugs. Social History   Social History  . Marital status: Widowed    Spouse name: N/A  . Number of children: N/A  . Years of education: N/A   Occupational History  . Retired    Social History Main Topics  . Smoking status: Former Smoker    Packs/day: 2.00    Years: 20.00    Types: Cigarettes    Quit date: 09/10/1976  . Smokeless tobacco: Never Used  . 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 Drug use: No  . Sexual activity: No   Other Topics Concern  . Not on file   Social History Narrative   Lives alone.  No close family nearby.     Family History  Problem Relation Age of Onset  . Diabetes type I Father   . Hypertension Mother   . Cancer Brother      Testicular Cancer  . Cancer Brother     Leukemia    Vitals:   08/22/16 1101  BP: 138/84  Pulse: 85  Resp: 17  Temp: 97.6 F (36.4 C)  TempSrc: Oral  SpO2: 98%  Weight: (!) 344 lb 6 oz (156.2 kg)  Height: 6\' 1"  (1.854 m)   Body mass index is 45.43 kg/m.    Outpatient Encounter Prescriptions as of 08/22/2016  Medication Sig  . aluminum-magnesium hydroxide-simethicone (MAALOX) 109-323-55 MG/5ML SUSP Take 30 mLs by mouth every 6 (six) hours as needed.  Marland Kitchen amLODipine (NORVASC) 5 MG tablet Take 5 mg by mouth daily.  Marland Kitchen aspirin EC 81 MG tablet Take 81 mg by mouth daily. 0900  . escitalopram (LEXAPRO) 10 MG tablet Take 10 mg by mouth daily.   . folic acid (FOLVITE) 1 MG tablet Take 1 mg by mouth daily.  Marland Kitchen gabapentin (NEURONTIN) 100 MG capsule Take 1 capsule (100 mg total) by mouth 2 (two) times daily.  Marland Kitchen guaiFENesin (MUCINEX) 600 MG 12 hr tablet Take 600 mg by mouth 2 (two) times daily.  . hydrOXYzine (ATARAX/VISTARIL) 25 MG tablet Take 25 mg by mouth 4 (four) times daily.   . insulin aspart (NOVOLOG) 100 UNIT/ML injection Inject into the skin. Inject as per sliding scale: if  0-70=0 Call MD ; 71-150 = 38 units, 151-400= 46 units; 401 +.  subcutaneously before meals  related to DM.  Marland Kitchen Insulin Glargine (TOUJEO SOLOSTAR) 300 UNIT/ML SOPN Inject 120 Units into the skin at bedtime.   Marland Kitchen ipratropium-albuterol (DUONEB) 0.5-2.5 (3) MG/3ML SOLN Take 3 mLs by nebulization every 4 (four) hours as needed. (Patient taking differently: Take 3 mLs by nebulization every 6 (six) hours as needed. )  . magnesium oxide (MAG-OX) 400 MG tablet Take 400 mg by mouth daily. 0900  . methotrexate (RHEUMATREX) 7.5 MG tablet Take 7.5 mg by mouth once a week. Caution" Chemotherapy. Protect from light. On Wednesday  . metoprolol tartrate (LOPRESSOR) 25 MG tablet 0.5  tablet by mouth two times a day 0900 , 2100  . Multiple Vitamins-Minerals (DECUBI-VITE) CAPS Take 1 capsule by mouth daily at 3 pm.  . nitroGLYCERIN (NITROSTAT) 0.4 MG SL tablet Place 0.4 mg under the tongue every 5 (five) minutes as needed for chest pain.  Marland Kitchen omeprazole (PRILOSEC) 40 MG capsule Take 40 mg by mouth daily. 0600  . potassium chloride (KLOR-CON) 20 MEQ packet Take 40 mEq by mouth daily. 0800  . predniSONE (DELTASONE) 20 MG tablet Take 20 mg by mouth daily with breakfast.   . rOPINIRole (REQUIP) 0.5 MG tablet Take 0.5 mg by mouth at bedtime. 2100  . saccharomyces boulardii (FLORASTOR) 250 MG capsule Take 250 mg by mouth 2 (two) times daily.  . sennosides-docusate sodium (SENOKOT-S) 8.6-50 MG tablet Take 2 tablets by mouth at bedtime. 2100  . torsemide (DEMADEX) 20 MG tablet Take 20 mg by mouth 2 (two) times daily.  . traMADol-acetaminophen (ULTRACET) 37.5-325 MG tablet Take 1-2 tablets by mouth every 6 (six) hours as needed for moderate pain.  . traZODone (DESYREL) 50 MG tablet Take 50 mg by mouth at bedtime.   . vitamin C (ASCORBIC ACID) 500 MG tablet Take 500 mg by mouth 2 (two) times daily. 0900 and 2100  . [DISCONTINUED] insulin aspart (NOVOLOG) 100 UNIT/ML injection Inject 30 Units into the skin 3 (three) times daily before meals.    No facility-administered encounter medications on file as of 08/22/2016.       SIGNIFICANT DIAGNOSTIC EXAMS  10-12-15: chest x-ray: Enlarged cardiac silhouette. Pulmonary vascular congestion.  10-18-15: EEG: This is a normal EEG for the patients stated age.  There were no focal, hemispheric or lateralizing features.  No epileptiform activity was recorded.  A normal EEG does not exclude the diagnosis of a seizure disorder and if seizure remains high on the list of differential diagnosis, an ambulatory EEG may be of value.  Clinical correlation is required.  01-11-16: chest x-ray: Minimal left base atelectasis.  Otherwise, normal exam.  01-11-16: ct of head: No acute intracranial abnormality. Stable mild cerebral atrophy and chronic white matter disease. No definite acute cortical infarction.  01-11-16: scrotal ultrasound: 1. Left epididymo-orchitis. 2. Microlithiasis involving the left testicle. 3. Three adjacent complex cysts/spermatoceles involving the left epididymal head.   LABS REVIEWED:   09-10-15: hgb a1c 10.4 09-15-15; tsh 2.954 10-12-15; wbc 5.9; hgb 11.8; hct 36.1; mcv 93.5; pt 195; glucose 187; bun 8; creat 0.78; k+ 5.0; na++136 11-03-15: wbc 9.2; hgb 12.7; hct 39.7; mcv 89.3; plt 267; glucose 105; bun 17.2; creat 0.61; k+ 4.2; na++135  12-29-15: chol 180; ldl 115; trig 66; hdl 52; hgb a1c 8.8  01-11-16: wbc 19.6; hgb 13.6; hct 40.1; mcv 87.6; plt 232; glucose 255; bun 15; creat 0.67; k+ 4.7; na++132; liver normal albumin 2.9; blood culture: no growth; urine culture: e-coli-esbl; klebsiella pneumoniae  01-15-16: wbc 7.4; hgb 11.6; hct 35.7; mcv 89.9; plt 242; glucose 159; bun 5; creat 0.53; k+ 4.1; na++135  05-27-16: hgb a1c 11.7 07-20-16: stool for c-diff: +  07-30-16: wbc 15.1; hgb 12.4; hct 37.7; mcv 93.5; plt 289; glucose 322; bun 28.3; creat 0.91; k+ 4.3; na++ 137; liver normal albumin 3.4       Review of Systems  Constitutional: Negative for malaise/fatigue.  Respiratory: Negative for cough and shortness of breath.   Cardiovascular: Positive for leg  swelling. Negative for chest pain and palpitations.  Gastrointestinal: Negative for heartburn, abdominal pain and constipation.  Musculoskeletal: right thigh pain   Skin: sores  are improving.   Neurological: Negative for dizziness.  Psychiatric/Behavioral: The patient is not nervous/anxious.        Physical Exam  Constitutional: He is oriented to person, place, and time. No distress.  Obese   Eyes: Conjunctivae are normal.  Neck: Neck supple. No JVD present. No thyromegaly present.  Cardiovascular: Normal rate, regular rhythm and intact distal pulses.   Respiratory: Effort normal and breath sounds normal. No respiratory distress. He has no wheezes.  GI: Soft. Bowel sounds are normal. He exhibits no distension. There is no tenderness.  Genitourinary:  Has foley has penile tear    Musculoskeletal: He exhibits edema.  Able to move all extremities Trace bilateral lower extremity edema     Lymphadenopathy:    He has no cervical adenopathy.  Neurological: alert  Skin: Skin is warm and dry. He is not diaphoretic. 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 Hypertension: will continue norvasc 5 mg daily  Lopressor 25 mg twice daily asa 81 mg daily   3. Gerd: will continue prilosec 40 mg daily   4.  Diastolic heart failure:  2-d done 05-28-16: will continue demadex 20 mg twice daily with k+ 40 meq daily  Will monitor  5. Diabetes: hgb a1c 11.7; will continue tuojeo  120 units nightly will change novolog to 50 units after meals.   6.  OSA: is on cpap;   7. Diabetic neuropathy: will continue neurontin 100 mg twice daily   8. Depression will continue lexapro 10 mg daily  9.  History of CVA: is neurologically stable; will continue asa 81 mg daily   10.  Allergic rhinitis: will continue  mucinex 600 mg twice daily duoneb four times daily as needed   11. RLS: will continue requip 0.5 mg nightly   12. CAD: no complaint of chest pain: will continue asa 81 mg daily and prn ntg  13. Constipation: will continue senna s nightly   Will setup urology consult Will check psa and cbc   MD is aware of resident's narcotic use and is in agreement with current plan of care. We will wean dosage as appropriate for resident.    Ok Edwards NP [facility]  Contact [phone] Monday through Friday 8am- 5pm  After hours call [phone]

## 2016-08-23 DIAGNOSIS — E669 Obesity, unspecified: Secondary | ICD-10-CM | POA: Diagnosis not present

## 2016-08-23 DIAGNOSIS — R339 Retention of urine, unspecified: Secondary | ICD-10-CM | POA: Diagnosis not present

## 2016-08-23 DIAGNOSIS — N329 Bladder disorder, unspecified: Secondary | ICD-10-CM | POA: Diagnosis not present

## 2016-08-23 DIAGNOSIS — R531 Weakness: Secondary | ICD-10-CM | POA: Diagnosis not present

## 2016-08-30 DIAGNOSIS — L98419 Non-pressure chronic ulcer of buttock with unspecified severity: Secondary | ICD-10-CM | POA: Diagnosis not present

## 2016-08-30 DIAGNOSIS — L988 Other specified disorders of the skin and subcutaneous tissue: Secondary | ICD-10-CM | POA: Diagnosis not present

## 2016-09-06 DIAGNOSIS — L988 Other specified disorders of the skin and subcutaneous tissue: Secondary | ICD-10-CM | POA: Diagnosis not present

## 2016-09-06 DIAGNOSIS — L98419 Non-pressure chronic ulcer of buttock with unspecified severity: Secondary | ICD-10-CM | POA: Diagnosis not present

## 2016-09-12 DIAGNOSIS — L988 Other specified disorders of the skin and subcutaneous tissue: Secondary | ICD-10-CM | POA: Diagnosis not present

## 2016-09-12 DIAGNOSIS — L98419 Non-pressure chronic ulcer of buttock with unspecified severity: Secondary | ICD-10-CM | POA: Diagnosis not present

## 2016-09-13 ENCOUNTER — Encounter: Payer: Self-pay | Admitting: Adult Health

## 2016-09-19 ENCOUNTER — Non-Acute Institutional Stay (SKILLED_NURSING_FACILITY): Payer: Medicare Other | Admitting: Adult Health

## 2016-09-19 DIAGNOSIS — IMO0002 Reserved for concepts with insufficient information to code with codable children: Secondary | ICD-10-CM

## 2016-09-19 DIAGNOSIS — E1165 Type 2 diabetes mellitus with hyperglycemia: Secondary | ICD-10-CM | POA: Diagnosis not present

## 2016-09-19 DIAGNOSIS — L109 Pemphigus, unspecified: Secondary | ICD-10-CM | POA: Diagnosis not present

## 2016-09-19 DIAGNOSIS — E1143 Type 2 diabetes mellitus with diabetic autonomic (poly)neuropathy: Secondary | ICD-10-CM

## 2016-10-04 ENCOUNTER — Non-Acute Institutional Stay (SKILLED_NURSING_FACILITY): Payer: Medicare Other | Admitting: Adult Health

## 2016-10-04 DIAGNOSIS — L109 Pemphigus, unspecified: Secondary | ICD-10-CM | POA: Diagnosis not present

## 2016-10-04 DIAGNOSIS — E1165 Type 2 diabetes mellitus with hyperglycemia: Secondary | ICD-10-CM

## 2016-10-04 DIAGNOSIS — E1143 Type 2 diabetes mellitus with diabetic autonomic (poly)neuropathy: Secondary | ICD-10-CM | POA: Diagnosis not present

## 2016-10-04 DIAGNOSIS — IMO0002 Reserved for concepts with insufficient information to code with codable children: Secondary | ICD-10-CM

## 2016-10-05 DIAGNOSIS — E1169 Type 2 diabetes mellitus with other specified complication: Secondary | ICD-10-CM | POA: Diagnosis not present

## 2016-10-05 DIAGNOSIS — I10 Essential (primary) hypertension: Secondary | ICD-10-CM | POA: Diagnosis not present

## 2016-10-05 DIAGNOSIS — E08311 Diabetes mellitus due to underlying condition with unspecified diabetic retinopathy with macular edema: Secondary | ICD-10-CM | POA: Diagnosis not present

## 2016-10-05 DIAGNOSIS — E119 Type 2 diabetes mellitus without complications: Secondary | ICD-10-CM | POA: Diagnosis not present

## 2016-10-05 DIAGNOSIS — Z79899 Other long term (current) drug therapy: Secondary | ICD-10-CM | POA: Diagnosis not present

## 2016-10-09 NOTE — Progress Notes (Signed)
Location:   [place]    Place of Service:  SNF (31)   CODE STATUS: full code   Allergies  Allergen Reactions  . Ace Inhibitors Swelling    Pt tolerates lisinopril  . Lipitor [Atorvastatin Calcium] Swelling  . Metformin And Related Swelling  . Cefadroxil Hives  . Cephalexin Hives  . Morphine And Related Other (See Comments)    Sweating, feels like is "in rocky boat."  . Robaxin [Methocarbamol] Other (See Comments)    Feels like he is shaky    Chief Complaint  Patient presents with  . Acute Visit    diabetes     HPI:  His cbg's remain elevated. He is nonadherent to taking his insulin on a consistent basis. He is not voicing any concerns at this time. States that he takes the insulin when he feels it is necessary. He remains on prednisone for his pemphigoid. We discussed him returning to dermatology; but he has declined this. He is willing to wean off the prednisone.   Past Medical History:  Diagnosis Date  . Arthritis    "hands, ankles, knees" (11/10/2014)  . Bladder spasms   . CAD (coronary artery disease)   . CAD in native artery 09/15/2015  . CHF (congestive heart failure) ([place])   . Chronic indwelling Foley catheter   . Chronic lower back pain   . Depression   . Diabetic diarrhea ([place])   . Diabetic neuropathy, painful ([place])   . DVT (deep venous thrombosis) ([facility]) 1980's   LLE  . GERD (gastroesophageal reflux disease)   . Hyperlipidemia   . Hypertension   . Neurogenic bladder   . Obese   . OSA (obstructive sleep apnea)    "they wanted me to wear a mask; I couldn't" (11/10/2014)  . Pneumonia 1999  . PONV (postoperative nausea and vomiting)   . Stroke [facility]) 10/2011; 06/2014   right hand numbness/notes 10/20/2011, pt not sure he had stroke in 2013; "right hand weaker and right face not quite right since" (11/10/2014)  . Type II diabetes mellitus ([place])   . Uncontrolled type II diabetes with peripheral autonomic neuropathy ([place]) 05/09/2015    Past Surgical History:    Procedure Laterality Date  . CARDIAC CATHETERIZATION N/A 05/02/2015   Procedure: Left Heart Cath and Coronary Angiography;  Surgeon: Lorretta Harp, MD;  Location: [place] CV LAB;  Service: Cardiovascular;  Laterality: N/A;  . CARDIAC CATHETERIZATION N/A 05/04/2015   Procedure: Left Heart Cath and Coronary Angiography;  Surgeon: Wellington Hampshire, MD;  Location: [place] CV LAB;  Service: Cardiovascular;  Laterality: N/A;  . CARPAL TUNNEL RELEASE Bilateral ~ 2003-2004  . CHOLECYSTECTOMY OPEN  1970's?  Marland Kitchen GASTROPLASTY    . JOINT REPLACEMENT    . LEFT HEART CATHETERIZATION WITH CORONARY ANGIOGRAM N/A 10/24/2011   Procedure: LEFT HEART CATHETERIZATION WITH CORONARY ANGIOGRAM;  Surgeon: Candee Furbish, MD;  Location: [facility] CATH LAB;  Service: Cardiovascular;  Laterality: N/A;  right radial artery approach  . TOTAL KNEE ARTHROPLASTY Right 1990's    Social History   Social History  . Marital status: Widowed    Spouse name: N/A  . Number of children: N/A  . Years of education: N/A   Occupational History  . Retired    Social History Main Topics  . Smoking status: Former Smoker    Packs/day: 2.00    Years: 20.00    Types: Cigarettes    Quit date: 09/10/1976  . Smokeless tobacco: Never Used  .  Alcohol use No     Comment: FORMER ALCOHOLIC; "sober since 81/82/9937"  . Drug use: No  . Sexual activity: No   Other Topics Concern  . Not on file   Social History Narrative   Lives alone.  No close family nearby.     Family History  Problem Relation Age of Onset  . Diabetes type I Father   . Hypertension Mother   . Cancer Brother      Testicular Cancer  . Cancer Brother     Leukemia      VITAL SIGNS BP 139/88   Pulse 88   Resp 18   Ht 6\' 1"  (1.854 m)   Wt (!) 344 lb (156 kg)   BMI 45.39 kg/m   Patient's Medications  New Prescriptions   No medications on file  Previous Medications   ALUMINUM-MAGNESIUM HYDROXIDE-SIMETHICONE (MAALOX) 169-678-93 MG/5ML SUSP    Take 30 mLs  by mouth every 6 (six) hours as needed.   AMLODIPINE (NORVASC) 5 MG TABLET    Take 5 mg by mouth daily.   ASPIRIN EC 81 MG TABLET    Take 81 mg by mouth daily. 0900   ESCITALOPRAM (LEXAPRO) 10 MG TABLET    Take 10 mg by mouth daily.    FOLIC ACID (FOLVITE) 1 MG TABLET    Take 1 mg by mouth daily.   GABAPENTIN (NEURONTIN) 100 MG CAPSULE    Take 1 capsule (100 mg total) by mouth 2 (two) times daily.   GUAIFENESIN (MUCINEX) 600 MG 12 HR TABLET    Take 600 mg by mouth 2 (two) times daily.   HYDROXYZINE (ATARAX/VISTARIL) 25 MG TABLET    Take 25 mg by mouth 4 (four) times daily.    INSULIN ASPART (NOVOLOG) 100 UNIT/ML INJECTION    Inject into the skin. Inject as per sliding scale: if  0-70=0 Call MD ; 71-150 = 38 units, 151-400= 46 units; 401 +.  subcutaneously before meals related to DM.   INSULIN GLARGINE (TOUJEO SOLOSTAR) 300 UNIT/ML SOPN    Inject 120 Units into the skin at bedtime.    IPRATROPIUM-ALBUTEROL (DUONEB) 0.5-2.5 (3) MG/3ML SOLN    Take 3 mLs by nebulization every 4 (four) hours as needed.   MAGNESIUM OXIDE (MAG-OX) 400 MG TABLET    Take 400 mg by mouth daily. 0900   METHOTREXATE (RHEUMATREX) 7.5 MG TABLET    Take 7.5 mg by mouth once a week. Caution" Chemotherapy. Protect from light. On Wednesday   METOPROLOL TARTRATE (LOPRESSOR) 25 MG TABLET    0.5 tablet by mouth two times a day 0900 , 2100   MULTIPLE VITAMINS-MINERALS (DECUBI-VITE) CAPS    Take 1 capsule by mouth daily at 3 pm.   NITROGLYCERIN (NITROSTAT) 0.4 MG SL TABLET    Place 0.4 mg under the tongue every 5 (five) minutes as needed for chest pain.   OMEPRAZOLE (PRILOSEC) 40 MG CAPSULE    Take 40 mg by mouth daily. 0600   POTASSIUM CHLORIDE (KLOR-CON) 20 MEQ PACKET    Take 40 mEq by mouth daily. 0800   PREDNISONE (DELTASONE) 20 MG TABLET    Take 20 mg by mouth daily with breakfast.    ROPINIROLE (REQUIP) 0.5 MG TABLET    Take 0.5 mg by mouth at bedtime. 2100   SENNOSIDES-DOCUSATE SODIUM (SENOKOT-S) 8.6-50 MG TABLET    Take 2  tablets by mouth at bedtime. 2100   TORSEMIDE (DEMADEX) 20 MG TABLET    Take 20 mg by mouth 2 (two)  times daily.   TRAMADOL-ACETAMINOPHEN (ULTRACET) 37.5-325 MG TABLET    Take 1-2 tablets by mouth every 6 (six) hours as needed for moderate pain.   TRAZODONE (DESYREL) 50 MG TABLET    Take 50 mg by mouth at bedtime.    VITAMIN C (ASCORBIC ACID) 500 MG TABLET    Take 500 mg by mouth 2 (two) times daily. 0900 and 2100  Modified Medications   No medications on file  Discontinued Medications   No medications on file     SIGNIFICANT DIAGNOSTIC EXAMS   10-12-15: chest x-ray: Enlarged cardiac silhouette. Pulmonary vascular congestion.  10-18-15: EEG: This is a normal EEG for the patients stated age.  There were no focal, hemispheric or lateralizing features.  No epileptiform activity was recorded.  A normal EEG does not exclude the diagnosis of a seizure disorder and if seizure remains high on the list of differential diagnosis, an ambulatory EEG may be of value.  Clinical correlation is required.  01-11-16: chest x-ray: Minimal left base atelectasis.  Otherwise, normal exam.  01-11-16: ct of head: No acute intracranial abnormality. Stable mild cerebral atrophy and chronic white matter disease. No definite acute cortical infarction.  01-11-16: scrotal ultrasound: 1. Left epididymo-orchitis. 2. Microlithiasis involving the left testicle. 3. Three adjacent complex cysts/spermatoceles involving the left epididymal head.   LABS REVIEWED:   09-10-15: hgb a1c 10.4 09-15-15; tsh 2.954 10-12-15; wbc 5.9; hgb 11.8; hct 36.1; mcv 93.5; pt 195; glucose 187; bun 8; creat 0.78; k+ 5.0; na++136 11-03-15: wbc 9.2; hgb 12.7; hct 39.7; mcv 89.3; plt 267; glucose 105; bun 17.2; creat 0.61; k+ 4.2; na++135  12-29-15: chol 180; ldl 115; trig 66; hdl 52; hgb a1c 8.8  01-11-16: wbc 19.6; hgb 13.6; hct 40.1; mcv 87.6; plt 232; glucose 255; bun 15; creat 0.67; k+ 4.7; na++132; liver normal albumin 2.9; blood culture: no growth;  urine culture: e-coli-esbl; klebsiella pneumoniae  01-15-16: wbc 7.4; hgb 11.6; hct 35.7; mcv 89.9; plt 242; glucose 159; bun 5; creat 0.53; k+ 4.1; na++135  05-27-16: hgb a1c 11.7 07-20-16: stool for c-diff: +  07-30-16: wbc 15.1; hgb 12.4; hct 37.7; mcv 93.5; plt 289; glucose 322; bun 28.3; creat 0.91; k+ 4.3; na++ 137; liver normal albumin 3.4       Review of Systems  Constitutional: Negative for malaise/fatigue.  Respiratory: Negative for cough and shortness of breath.   Cardiovascular: Positive for leg swelling. Negative for chest pain and palpitations.  Gastrointestinal: Negative for heartburn, abdominal pain and constipation.  Musculoskeletal: right thigh pain   Skin: sores are improving.   Neurological: Negative for dizziness.  Psychiatric/Behavioral: The patient is not nervous/anxious.        Physical Exam  Constitutional: He is oriented to person, place, and time. No distress.  Obese   Eyes: Conjunctivae are normal.  Neck: Neck supple. No JVD present. No thyromegaly present.  Cardiovascular: Normal rate, regular rhythm and intact distal pulses.   Respiratory: Effort normal and breath sounds normal. No respiratory distress. He has no wheezes.  GI: Soft. Bowel sounds are normal. He exhibits no distension. There is no tenderness.  Genitourinary:  Has foley has penile tear    Musculoskeletal: He exhibits edema.  Able to move all extremities Trace bilateral lower extremity edema     Lymphadenopathy:    He has no cervical adenopathy.  Neurological: alert  Skin: Skin is warm and dry. He is not diaphoretic. His skin is improving   ASSESSMENT/ PLAN:  1. bullous pemphigoid: will continue  methotrexate 7.5 mg weekly  Folic acid 1 mg daily and atarax 25 mg four times daily for itching   Will lower his prednisone to 15 mg daily will monitor   2. Diabetes: hgb a1c 11.7; will continue  novolog  50 units after meals and will increase his toujeo to 130 units nightly and will monitor      MD is aware of resident's narcotic use and is in agreement with current plan of care. We will attempt to wean resident as apropriate   Ok Edwards NP [facility]  Contact [phone] Monday through Friday 8am- 5pm  After hours call [phone]

## 2016-10-10 ENCOUNTER — Non-Acute Institutional Stay (SKILLED_NURSING_FACILITY): Payer: Medicare Other | Admitting: Adult Health

## 2016-10-10 DIAGNOSIS — I110 Hypertensive heart disease with heart failure: Secondary | ICD-10-CM | POA: Diagnosis not present

## 2016-10-10 DIAGNOSIS — I2510 Atherosclerotic heart disease of native coronary artery without angina pectoris: Secondary | ICD-10-CM | POA: Diagnosis not present

## 2016-10-10 DIAGNOSIS — Z8673 Personal history of transient ischemic attack (TIA), and cerebral infarction without residual deficits: Secondary | ICD-10-CM

## 2016-10-10 DIAGNOSIS — L109 Pemphigus, unspecified: Secondary | ICD-10-CM

## 2016-10-10 DIAGNOSIS — Z9989 Dependence on other enabling machines and devices: Secondary | ICD-10-CM | POA: Diagnosis not present

## 2016-10-10 DIAGNOSIS — E1165 Type 2 diabetes mellitus with hyperglycemia: Secondary | ICD-10-CM

## 2016-10-10 DIAGNOSIS — G4733 Obstructive sleep apnea (adult) (pediatric): Secondary | ICD-10-CM | POA: Diagnosis not present

## 2016-10-10 DIAGNOSIS — E1141 Type 2 diabetes mellitus with diabetic mononeuropathy: Secondary | ICD-10-CM

## 2016-10-10 DIAGNOSIS — N319 Neuromuscular dysfunction of bladder, unspecified: Secondary | ICD-10-CM | POA: Diagnosis not present

## 2016-10-10 DIAGNOSIS — Z9289 Personal history of other medical treatment: Secondary | ICD-10-CM | POA: Diagnosis not present

## 2016-10-10 DIAGNOSIS — I5032 Chronic diastolic (congestive) heart failure: Secondary | ICD-10-CM | POA: Diagnosis not present

## 2016-10-10 DIAGNOSIS — Z978 Presence of other specified devices: Secondary | ICD-10-CM

## 2016-10-10 DIAGNOSIS — E1143 Type 2 diabetes mellitus with diabetic autonomic (poly)neuropathy: Secondary | ICD-10-CM

## 2016-10-10 DIAGNOSIS — Z960 Presence of urogenital implants: Secondary | ICD-10-CM

## 2016-10-10 DIAGNOSIS — IMO0002 Reserved for concepts with insufficient information to code with codable children: Secondary | ICD-10-CM

## 2016-10-11 DIAGNOSIS — E1169 Type 2 diabetes mellitus with other specified complication: Secondary | ICD-10-CM | POA: Diagnosis not present

## 2016-10-11 DIAGNOSIS — Z79899 Other long term (current) drug therapy: Secondary | ICD-10-CM | POA: Diagnosis not present

## 2016-10-13 DIAGNOSIS — E1169 Type 2 diabetes mellitus with other specified complication: Secondary | ICD-10-CM | POA: Diagnosis not present

## 2016-10-13 DIAGNOSIS — Z79899 Other long term (current) drug therapy: Secondary | ICD-10-CM | POA: Diagnosis not present

## 2016-10-28 NOTE — Progress Notes (Signed)
Location:   [facility]    Place of Service:  SNF (31)    CODE STATUS: full code   Allergies  Allergen Reactions  . Ace Inhibitors Swelling    Pt tolerates lisinopril  . Lipitor [Atorvastatin Calcium] Swelling  . Metformin And Related Swelling  . Cefadroxil Hives  . Cephalexin Hives  . Morphine And Related Other (See Comments)    Sweating, feels like is "in rocky boat."  . Robaxin [Methocarbamol] Other (See Comments)    Feels like he is shaky    Chief Complaint  Patient presents with  . Acute Visit    diabetes     HPI:  Staff report that he has been declining his insulin on a routine basis. He tells me that he is taking too much insulin; that the doses are too high. I did discuss with him the purpose of his insulin and that his reading remain out of control. He told me that he did not care if the readings are too high. We did discuss his medication regimen and will start weaning him off the prednisone and will make the changes to his insulin that he desires.     Past Medical History:  Diagnosis Date  . Arthritis    "hands, ankles, knees" (11/10/2014)  . Bladder spasms   . CAD (coronary artery disease)   . CAD in native artery 09/15/2015  . CHF (congestive heart failure) ([place])   . Chronic indwelling Foley catheter   . Chronic lower back pain   . Depression   . Diabetic diarrhea ([place])   . Diabetic neuropathy, painful ([place])   . DVT (deep venous thrombosis) (HCC) 1980's   LLE  . GERD (gastroesophageal reflux disease)   . Hyperlipidemia   . Hypertension   . Neurogenic bladder   . Obese   . OSA (obstructive sleep apnea)    "they wanted me to wear a mask; I couldn't" (11/10/2014)  . Pneumonia 1999  . PONV (postoperative nausea and vomiting)   . Stroke [facility]) 10/2011; 06/2014   right hand numbness/notes 10/20/2011, pt not sure he had stroke in 2013; "right hand weaker and right face not quite right since" (11/10/2014)  . Type II diabetes mellitus ([place])   . Uncontrolled type  II diabetes with peripheral autonomic neuropathy ([place]) 05/09/2015    Past Surgical History:  Procedure Laterality Date  . CARDIAC CATHETERIZATION N/A 05/02/2015   Procedure: Left Heart Cath and Coronary Angiography;  Surgeon: Lorretta Harp, MD;  Location: [place] CV LAB;  Service: Cardiovascular;  Laterality: N/A;  . CARDIAC CATHETERIZATION N/A 05/04/2015   Procedure: Left Heart Cath and Coronary Angiography;  Surgeon: Wellington Hampshire, MD;  Location: [place] CV LAB;  Service: Cardiovascular;  Laterality: N/A;  . CARPAL TUNNEL RELEASE Bilateral ~ 2003-2004  . CHOLECYSTECTOMY OPEN  1970's?  Marland Kitchen GASTROPLASTY    . JOINT REPLACEMENT    . LEFT HEART CATHETERIZATION WITH CORONARY ANGIOGRAM N/A 10/24/2011   Procedure: LEFT HEART CATHETERIZATION WITH CORONARY ANGIOGRAM;  Surgeon: Candee Furbish, MD;  Location: [facility] CATH LAB;  Service: Cardiovascular;  Laterality: N/A;  right radial artery approach  . TOTAL KNEE ARTHROPLASTY Right 1990's    Social History   Social History  . Marital status: Widowed    Spouse name: N/A  . Number of children: N/A  . Years of education: N/A   Occupational History  . Retired    Social History Main Topics  . Smoking status: Former Smoker    Packs/day:  2.00    Years: 20.00    Types: Cigarettes    Quit date: 09/10/1976  . Smokeless tobacco: Never Used  . Alcohol use No     Comment: FORMER ALCOHOLIC; "sober since 01/77/9390"  . Drug use: No  . Sexual activity: No   Other Topics Concern  . Not on file   Social History Narrative   Lives alone.  No close family nearby.     Family History  Problem Relation Age of Onset  . Diabetes type I Father   . Hypertension Mother   . Cancer Brother      Testicular Cancer  . Cancer Brother     Leukemia      VITAL SIGNS BP 130/80   Pulse 85   Temp 97.6 F (36.4 C)   Resp 17   Ht 6\' 1"  (1.854 m)   Wt (!) 344 lb 9.6 oz (156.3 kg)   SpO2 98%   BMI 45.46 kg/m   Patient's Medications  New Prescriptions     No medications on file  Previous Medications   ALUMINUM-MAGNESIUM HYDROXIDE-SIMETHICONE (MAALOX) 300-923-30 MG/5ML SUSP    Take 30 mLs by mouth every 6 (six) hours as needed.   AMLODIPINE (NORVASC) 5 MG TABLET    Take 5 mg by mouth daily.   ASPIRIN EC 81 MG TABLET    Take 81 mg by mouth daily. 0900   ESCITALOPRAM (LEXAPRO) 10 MG TABLET    Take 10 mg by mouth daily.    FOLIC ACID (FOLVITE) 1 MG TABLET    Take 1 mg by mouth daily.   GABAPENTIN (NEURONTIN) 100 MG CAPSULE    Take 1 capsule (100 mg total) by mouth 2 (two) times daily.   GUAIFENESIN (MUCINEX) 600 MG 12 HR TABLET    Take 600 mg by mouth 2 (two) times daily.   HYDROXYZINE (ATARAX/VISTARIL) 25 MG TABLET    Take 25 mg by mouth 4 (four) times daily.    INSULIN ASPART (NOVOLOG) 100 UNIT/ML INJECTION    Inject into the skin. Inject as per sliding scale: if  0-70=0 Call MD ; 71-150 = 38 units, 151-400= 46 units; 401 +.  subcutaneously before meals related to DM.   INSULIN GLARGINE (TOUJEO SOLOSTAR) 300 UNIT/ML SOPN    Inject 120 Units into the skin at bedtime.    IPRATROPIUM-ALBUTEROL (DUONEB) 0.5-2.5 (3) MG/3ML SOLN    Take 3 mLs by nebulization every 4 (four) hours as needed.   MAGNESIUM OXIDE (MAG-OX) 400 MG TABLET    Take 400 mg by mouth daily. 0900   METHOTREXATE (RHEUMATREX) 7.5 MG TABLET    Take 7.5 mg by mouth once a week. Caution" Chemotherapy. Protect from light. On Wednesday   METOPROLOL TARTRATE (LOPRESSOR) 25 MG TABLET    0.5 tablet by mouth two times a day 0900 , 2100   MULTIPLE VITAMINS-MINERALS (DECUBI-VITE) CAPS    Take 1 capsule by mouth daily at 3 pm.   NITROGLYCERIN (NITROSTAT) 0.4 MG SL TABLET    Place 0.4 mg under the tongue every 5 (five) minutes as needed for chest pain.   OMEPRAZOLE (PRILOSEC) 40 MG CAPSULE    Take 40 mg by mouth daily. 0600   POTASSIUM CHLORIDE (KLOR-CON) 20 MEQ PACKET    Take 40 mEq by mouth daily. 0800   PREDNISONE (DELTASONE) 20 MG TABLET    Take 20 mg by mouth daily with breakfast.     ROPINIROLE (REQUIP) 0.5 MG TABLET    Take 0.5 mg by mouth  at bedtime. 2100   SENNOSIDES-DOCUSATE SODIUM (SENOKOT-S) 8.6-50 MG TABLET    Take 2 tablets by mouth at bedtime. 2100   TORSEMIDE (DEMADEX) 20 MG TABLET    Take 20 mg by mouth 2 (two) times daily.   TRAMADOL-ACETAMINOPHEN (ULTRACET) 37.5-325 MG TABLET    Take 1-2 tablets by mouth every 6 (six) hours as needed for moderate pain.   TRAZODONE (DESYREL) 50 MG TABLET    Take 50 mg by mouth at bedtime.    VITAMIN C (ASCORBIC ACID) 500 MG TABLET    Take 500 mg by mouth 2 (two) times daily. 0900 and 2100  Modified Medications   No medications on file  Discontinued Medications   No medications on file     SIGNIFICANT DIAGNOSTIC EXAMS  10-12-15: chest x-ray: Enlarged cardiac silhouette. Pulmonary vascular congestion.  10-18-15: EEG: This is a normal EEG for the patients stated age.  There were no focal, hemispheric or lateralizing features.  No epileptiform activity was recorded.  A normal EEG does not exclude the diagnosis of a seizure disorder and if seizure remains high on the list of differential diagnosis, an ambulatory EEG may be of value.  Clinical correlation is required.  01-11-16: chest x-ray: Minimal left base atelectasis.  Otherwise, normal exam.  01-11-16: ct of head: No acute intracranial abnormality. Stable mild cerebral atrophy and chronic white matter disease. No definite acute cortical infarction.  01-11-16: scrotal ultrasound: 1. Left epididymo-orchitis. 2. Microlithiasis involving the left testicle. 3. Three adjacent complex cysts/spermatoceles involving the left epididymal head.   LABS REVIEWED:   09-10-15: hgb a1c 10.4 09-15-15; tsh 2.954 10-12-15; wbc 5.9; hgb 11.8; hct 36.1; mcv 93.5; pt 195; glucose 187; bun 8; creat 0.78; k+ 5.0; na++136 11-03-15: wbc 9.2; hgb 12.7; hct 39.7; mcv 89.3; plt 267; glucose 105; bun 17.2; creat 0.61; k+ 4.2; na++135  12-29-15: chol 180; ldl 115; trig 66; hdl 52; hgb a1c 8.8  01-11-16: wbc 19.6;  hgb 13.6; hct 40.1; mcv 87.6; plt 232; glucose 255; bun 15; creat 0.67; k+ 4.7; na++132; liver normal albumin 2.9; blood culture: no growth; urine culture: e-coli-esbl; klebsiella pneumoniae  01-15-16: wbc 7.4; hgb 11.6; hct 35.7; mcv 89.9; plt 242; glucose 159; bun 5; creat 0.53; k+ 4.1; na++135  05-27-16: hgb a1c 11.7 07-20-16: stool for c-diff: +  07-30-16: wbc 15.1; hgb 12.4; hct 37.7; mcv 93.5; plt 289; glucose 322; bun 28.3; creat 0.91; k+ 4.3; na++ 137; liver normal albumin 3.4       Review of Systems  Constitutional: Negative for malaise/fatigue.  Respiratory: Negative for cough and shortness of breath.   Cardiovascular: Positive for leg swelling. Negative for chest pain and palpitations.  Gastrointestinal: Negative for heartburn, abdominal pain and constipation.  Musculoskeletal: right thigh pain   Skin: sores are improving.   Neurological: Negative for dizziness.  Psychiatric/Behavioral: The patient is not nervous/anxious.        Physical Exam  Constitutional: He is oriented to person, place, and time. No distress.  Obese   Eyes: Conjunctivae are normal.  Neck: Neck supple. No JVD present. No thyromegaly present.  Cardiovascular: Normal rate, regular rhythm and intact distal pulses.   Respiratory: Effort normal and breath sounds normal. No respiratory distress. He has no wheezes.  GI: Soft. Bowel sounds are normal. He exhibits no distension. There is no tenderness.  Genitourinary:  Has foley has penile tear    Musculoskeletal: He exhibits edema.  Able to move all extremities Trace bilateral lower extremity edema  Lymphadenopathy:    He has no cervical adenopathy.  Neurological: alert  Skin: Skin is warm and dry. He is not diaphoretic. 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 Diabetes: hgb a1c 11.7; will continue  novolog  50 units after meals and will change toujeo to 50 units nightly    Will check hgb a1c; lipids     MD is aware of resident's narcotic use and is in agreement with current plan of care. We will attempt to wean resident as apropriate   Ok Edwards NP [facility]  Contact [phone] Monday through Friday 8am- 5pm  After hours call [phone]

## 2016-10-30 DIAGNOSIS — E1142 Type 2 diabetes mellitus with diabetic polyneuropathy: Secondary | ICD-10-CM | POA: Diagnosis not present

## 2016-10-30 DIAGNOSIS — G63 Polyneuropathy in diseases classified elsewhere: Secondary | ICD-10-CM | POA: Diagnosis not present

## 2016-10-30 DIAGNOSIS — B351 Tinea unguium: Secondary | ICD-10-CM | POA: Diagnosis not present

## 2016-11-01 IMAGING — CR DG CHEST 2V
3 series · 3 of 3 positions shown · non-contrast
Comparison: Single view of the chest 08/27/2014. CT chest
09/14/2014.

CLINICAL DATA: Shortness of breath and chest pressure on the left
today.

EXAM:
CHEST  2 VIEW

[w chest lat]
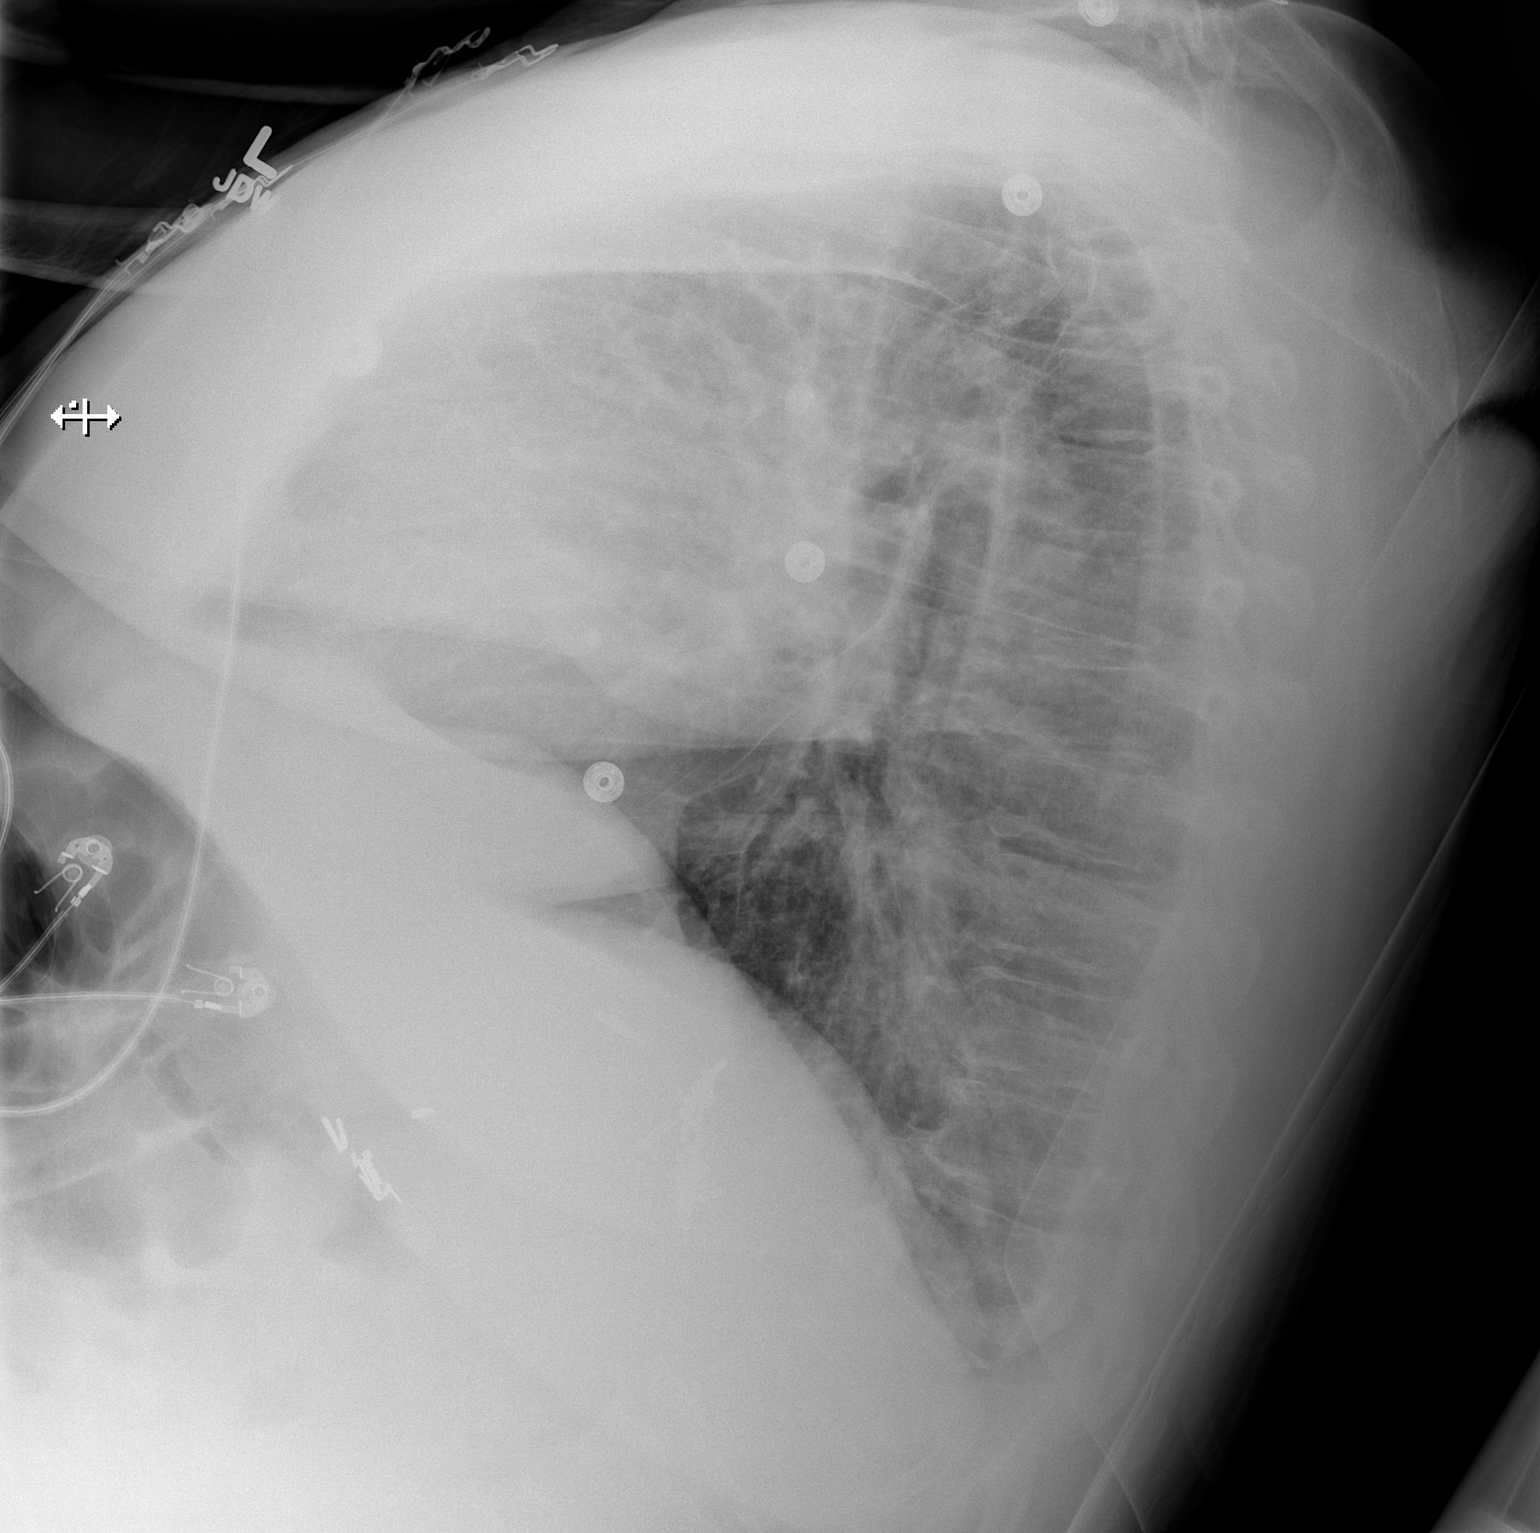

[x chest ap (1 of 2)]
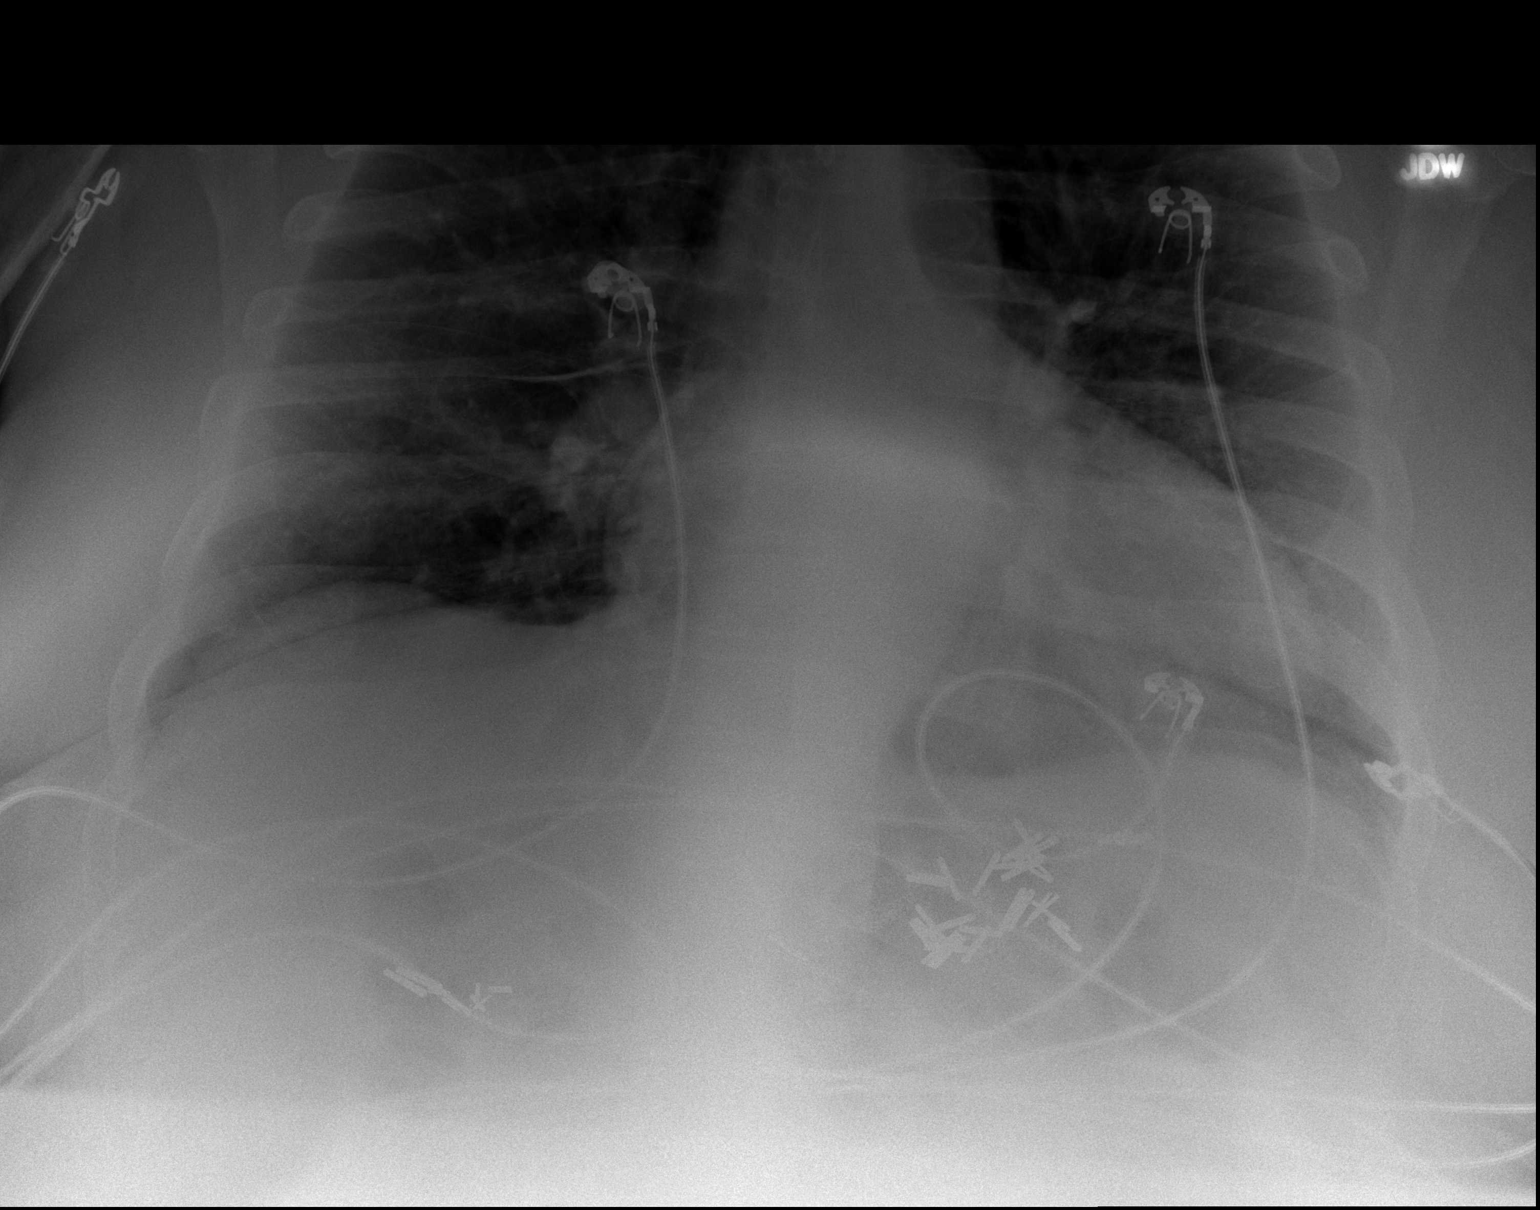

[x chest ap (2 of 2)]
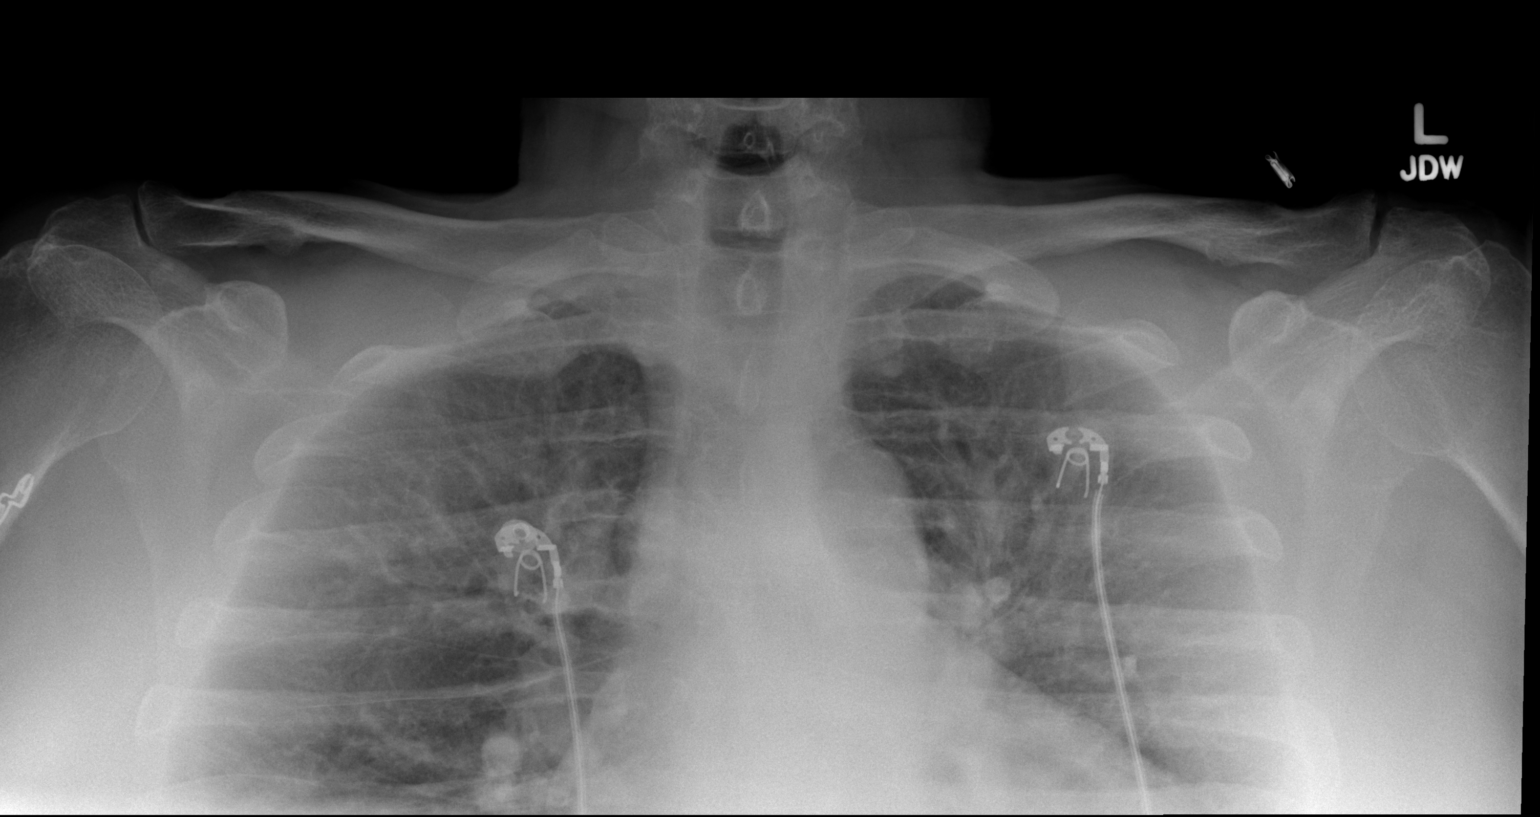

[3 of 3 positions shown; findings below may reference images not displayed]

FINDINGS: There is cardiomegaly without edema. The lungs are clear. No
pneumothorax or pleural effusion.
IMPRESSION: Cardiomegaly without acute disease.

## 2016-11-11 ENCOUNTER — Encounter: Payer: Self-pay | Admitting: Adult Health

## 2016-11-11 NOTE — Progress Notes (Signed)
Location:    [facility]   Place of Service:  SNF (31)   CODE STATUS: full code   Allergies  Allergen Reactions  . Ace Inhibitors Swelling    Pt tolerates lisinopril  . Lipitor [Atorvastatin Calcium] Swelling  . Metformin And Related Swelling  . Cefadroxil Hives  . Cephalexin Hives  . Morphine And Related Other (See Comments)    Sweating, feels like is "in rocky boat."  . Robaxin [Methocarbamol] Other (See Comments)    Feels like he is shaky    Chief Complaint  Patient presents with  . Medical Management of Chronic Issues    HPI:  He is a long term resident of this facility being seen for the management of his chronic illnesses. He is presently being weaned off the prednisone. His cbg's remain elevated. He has declined to take higher doses of insulin. There are no nursing concerns at this time.   Past Medical History:  Diagnosis Date  . Arthritis    "hands, ankles, knees" (11/10/2014)  . Bladder spasms   . CAD (coronary artery disease)   . CAD in native artery 09/15/2015  . CHF (congestive heart failure) ([place])   . Chronic indwelling Foley catheter   . Chronic lower back pain   . Depression   . Diabetic diarrhea ([place])   . Diabetic neuropathy, painful ([place])   . DVT (deep venous thrombosis) (HCC) 1980's   LLE  . GERD (gastroesophageal reflux disease)   . Hyperlipidemia   . Hypertension   . Neurogenic bladder   . Obese   . OSA (obstructive sleep apnea)    "they wanted me to wear a mask; I couldn't" (11/10/2014)  . Pneumonia 1999  . PONV (postoperative nausea and vomiting)   . Stroke [facility]) 10/2011; 06/2014   right hand numbness/notes 10/20/2011, pt not sure he had stroke in 2013; "right hand weaker and right face not quite right since" (11/10/2014)  . Type II diabetes mellitus ([place])   . Uncontrolled type II diabetes with peripheral autonomic neuropathy ([place]) 05/09/2015    Past Surgical History:  Procedure Laterality Date  . CARDIAC CATHETERIZATION N/A 05/02/2015   Procedure: Left Heart Cath and Coronary Angiography;  Surgeon: Lorretta Harp, MD;  Location: [place] CV LAB;  Service: Cardiovascular;  Laterality: N/A;  . CARDIAC CATHETERIZATION N/A 05/04/2015   Procedure: Left Heart Cath and Coronary Angiography;  Surgeon: Wellington Hampshire, MD;  Location: [place] CV LAB;  Service: Cardiovascular;  Laterality: N/A;  . CARPAL TUNNEL RELEASE Bilateral ~ 2003-2004  . CHOLECYSTECTOMY OPEN  1970's?  Marland Kitchen GASTROPLASTY    . JOINT REPLACEMENT    . LEFT HEART CATHETERIZATION WITH CORONARY ANGIOGRAM N/A 10/24/2011   Procedure: LEFT HEART CATHETERIZATION WITH CORONARY ANGIOGRAM;  Surgeon: Candee Furbish, MD;  Location: [facility] CATH LAB;  Service: Cardiovascular;  Laterality: N/A;  right radial artery approach  . TOTAL KNEE ARTHROPLASTY Right 1990's    Social History   Social History  . Marital status: Widowed    Spouse name: N/A  . Number of children: N/A  . Years of education: N/A   Occupational History  . Retired    Social History Main Topics  . Smoking status: Former Smoker    Packs/day: 2.00    Years: 20.00    Types: Cigarettes    Quit date: 09/10/1976  . Smokeless tobacco: Never Used  . Alcohol use No     Comment: FORMER ALCOHOLIC; "sober since 11/91/4782"  . Drug use: No  .  Sexual activity: No   Other Topics Concern  . Not on file   Social History Narrative   Lives alone.  No close family nearby.     Family History  Problem Relation Age of Onset  . Diabetes type I Father   . Hypertension Mother   . Cancer Brother      Testicular Cancer  . Cancer Brother     Leukemia      VITAL SIGNS BP 130/80   Pulse 85   Temp 97.6 F (36.4 C)   Resp 17   Ht 6\' 1"  (1.854 m)   Wt (!) 344 lb 9.6 oz (156.3 kg)   SpO2 97%   BMI 45.46 kg/m   Patient's Medications  New Prescriptions   No medications on file  Previous Medications   ALUMINUM-MAGNESIUM HYDROXIDE-SIMETHICONE (MAALOX) 967-893-81 MG/5ML SUSP    Take 30 mLs by mouth every 6 (six)  hours as needed.   AMLODIPINE (NORVASC) 5 MG TABLET    Take 5 mg by mouth daily.   ASPIRIN EC 81 MG TABLET    Take 81 mg by mouth daily. 0900   ESCITALOPRAM (LEXAPRO) 10 MG TABLET    Take 10 mg by mouth daily.    FOLIC ACID (FOLVITE) 1 MG TABLET    Take 1 mg by mouth daily.   GABAPENTIN (NEURONTIN) 100 MG CAPSULE    Take 1 capsule (100 mg total) by mouth 2 (two) times daily.   GUAIFENESIN (MUCINEX) 600 MG 12 HR TABLET    Take 600 mg by mouth 2 (two) times daily.   HYDROXYZINE (ATARAX/VISTARIL) 25 MG TABLET    Take 25 mg by mouth 4 (four) times daily.    INSULIN ASPART (NOVOLOG) 100 UNIT/ML INJECTION    50 units after meals    INSULIN GLARGINE (TOUJEO SOLOSTAR) 300 UNIT/ML SOPN    Inject 50 Units into the skin at bedtime.    IPRATROPIUM-ALBUTEROL (DUONEB) 0.5-2.5 (3) MG/3ML SOLN    Take 3 mLs by nebulization every 4 (four) hours as needed.   MAGNESIUM OXIDE (MAG-OX) 400 MG TABLET    Take 400 mg by mouth daily. 0900   METHOTREXATE (RHEUMATREX) 7.5 MG TABLET    Take 7.5 mg by mouth once a week. Caution" Chemotherapy. Protect from light. On Wednesday   METOPROLOL TARTRATE (LOPRESSOR) 25 MG TABLET    0.5 tablet by mouth two times a day 0900 , 2100   MULTIPLE VITAMINS-MINERALS (DECUBI-VITE) CAPS    Take 1 capsule by mouth daily at 3 pm.   NITROGLYCERIN (NITROSTAT) 0.4 MG SL TABLET    Place 0.4 mg under the tongue every 5 (five) minutes as needed for chest pain.   OMEPRAZOLE (PRILOSEC) 40 MG CAPSULE    Take 40 mg by mouth daily. 0600   POTASSIUM CHLORIDE (KLOR-CON) 20 MEQ PACKET    Take 40 mEq by mouth daily. 0800   PREDNISONE (DELTASONE) 20 MG TABLET    Take 20 mg by mouth daily with breakfast.    ROPINIROLE (REQUIP) 0.5 MG TABLET    Take 0.5 mg by mouth at bedtime. 2100   SENNOSIDES-DOCUSATE SODIUM (SENOKOT-S) 8.6-50 MG TABLET    Take 2 tablets by mouth at bedtime. 2100   TORSEMIDE (DEMADEX) 20 MG TABLET    Take 20 mg by mouth 2 (two) times daily.   TRAMADOL-ACETAMINOPHEN (ULTRACET) 37.5-325 MG  TABLET    Take 1-2 tablets by mouth every 6 (six) hours as needed for moderate pain.   TRAZODONE (DESYREL) 50 MG TABLET  Take 50 mg by mouth at bedtime.    VITAMIN C (ASCORBIC ACID) 500 MG TABLET    Take 500 mg by mouth 2 (two) times daily. 0900 and 2100  Modified Medications   No medications on file  Discontinued Medications   No medications on file     SIGNIFICANT DIAGNOSTIC EXAMS   10-12-15: chest x-ray: Enlarged cardiac silhouette. Pulmonary vascular congestion.  10-18-15: EEG: This is a normal EEG for the patients stated age.  There were no focal, hemispheric or lateralizing features.  No epileptiform activity was recorded.  A normal EEG does not exclude the diagnosis of a seizure disorder and if seizure remains high on the list of differential diagnosis, an ambulatory EEG may be of value.  Clinical correlation is required.  01-11-16: chest x-ray: Minimal left base atelectasis.  Otherwise, normal exam.  01-11-16: ct of head: No acute intracranial abnormality. Stable mild cerebral atrophy and chronic white matter disease. No definite acute cortical infarction.  01-11-16: scrotal ultrasound: 1. Left epididymo-orchitis. 2. Microlithiasis involving the left testicle. 3. Three adjacent complex cysts/spermatoceles involving the left epididymal head.   LABS REVIEWED:   09-15-15; tsh 2.954 10-12-15; wbc 5.9; hgb 11.8; hct 36.1; mcv 93.5; pt 195; glucose 187; bun 8; creat 0.78; k+ 5.0; na++136 11-03-15: wbc 9.2; hgb 12.7; hct 39.7; mcv 89.3; plt 267; glucose 105; bun 17.2; creat 0.61; k+ 4.2; na++135  12-29-15: chol 180; ldl 115; trig 66; hdl 52; hgb a1c 8.8  01-11-16: wbc 19.6; hgb 13.6; hct 40.1; mcv 87.6; plt 232; glucose 255; bun 15; creat 0.67; k+ 4.7; na++132; liver normal albumin 2.9; blood culture: no growth; urine culture: e-coli-esbl; klebsiella pneumoniae  01-15-16: wbc 7.4; hgb 11.6; hct 35.7; mcv 89.9; plt 242; glucose 159; bun 5; creat 0.53; k+ 4.1; na++135  05-27-16: hgb a1c  11.7 07-20-16: stool for c-diff: +  07-30-16: wbc 15.1; hgb 12.4; hct 37.7; mcv 93.5; plt 289; glucose 322; bun 28.3; creat 0.91; k+ 4.3; na++ 137; liver normal albumin 3.4    Review of Systems  Constitutional: Negative for malaise/fatigue.  Respiratory: Negative for cough and shortness of breath.   Cardiovascular: Positive for leg swelling. Negative for chest pain and palpitations.  Gastrointestinal: Negative for heartburn, abdominal pain and constipation.  Musculoskeletal: right thigh pain   Skin: sores are nearly resolved   Neurological: Negative for dizziness.  Psychiatric/Behavioral: The patient is not nervous/anxious.        Physical Exam  Constitutional: He is oriented to person, place, and time. No distress.  Obese   Eyes: Conjunctivae are normal.  Neck: Neck supple. No JVD present. No thyromegaly present.  Cardiovascular: Normal rate, regular rhythm and intact distal pulses.   Respiratory: Effort normal and breath sounds normal. No respiratory distress. He has no wheezes.  GI: Soft. Bowel sounds are normal. He exhibits no distension. There is no tenderness.  Genitourinary:  Has foley has penile tear    Musculoskeletal: He exhibits edema.  Able to move all extremities Trace bilateral lower extremity edema     Lymphadenopathy:    He has no cervical adenopathy.  Neurological: alert  Skin: Skin is warm and dry. He is not diaphoretic. 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   2. Hypertension: will  continue norvasc 5 mg daily  Lopressor 25 mg twice daily asa 81 mg daily   3. Gerd: will continue prilosec 40 mg daily   4.  Diastolic heart failure:  2-d done 05-28-16: will continue demadex 20 mg twice daily with k+ 40 meq daily  Will  monitor  5. Diabetes: hgb a1c 11.7; will continue tuojeo 50 units nightly and novolog  50 units after meals.  He will not take higher doses of insulin   6.  OSA: is on cpap;   7. Diabetic neuropathy: will continue neurontin 100 mg twice daily   8. Depression will continue lexapro 10 mg daily  9.  History of CVA: is neurologically stable; will continue asa 81 mg daily   10.  Allergic rhinitis: will continue mucinex 600 mg twice daily duoneb four times daily as needed   11. RLS: will continue requip 0.5 mg nightly   12. CAD: no complaint of chest pain: will continue asa 81 mg daily and prn ntg  13. Constipation: will continue senna s nightly     Will check hgb a1c tsh lipids urine micro-albumin     MD is aware of resident's narcotic use and is in agreement with current plan of care. We will attempt to wean resident as apropriate   Ok Edwards NP [facility] Adult Medicine  Contact [phone] Monday through Friday 8am- 5pm  After hours call [phone]

## 2016-11-13 ENCOUNTER — Encounter: Payer: Self-pay | Admitting: Adult Health

## 2016-11-13 ENCOUNTER — Non-Acute Institutional Stay (SKILLED_NURSING_FACILITY): Payer: Medicare Other | Admitting: Adult Health

## 2016-11-13 DIAGNOSIS — I5032 Chronic diastolic (congestive) heart failure: Secondary | ICD-10-CM | POA: Diagnosis not present

## 2016-11-13 DIAGNOSIS — E1141 Type 2 diabetes mellitus with diabetic mononeuropathy: Secondary | ICD-10-CM

## 2016-11-13 DIAGNOSIS — Z978 Presence of other specified devices: Secondary | ICD-10-CM

## 2016-11-13 DIAGNOSIS — Z8673 Personal history of transient ischemic attack (TIA), and cerebral infarction without residual deficits: Secondary | ICD-10-CM

## 2016-11-13 DIAGNOSIS — Z9289 Personal history of other medical treatment: Secondary | ICD-10-CM | POA: Diagnosis not present

## 2016-11-13 DIAGNOSIS — Z960 Presence of urogenital implants: Secondary | ICD-10-CM

## 2016-11-13 DIAGNOSIS — E1143 Type 2 diabetes mellitus with diabetic autonomic (poly)neuropathy: Secondary | ICD-10-CM

## 2016-11-13 DIAGNOSIS — G2581 Restless legs syndrome: Secondary | ICD-10-CM

## 2016-11-13 DIAGNOSIS — L109 Pemphigus, unspecified: Secondary | ICD-10-CM

## 2016-11-13 DIAGNOSIS — IMO0002 Reserved for concepts with insufficient information to code with codable children: Secondary | ICD-10-CM

## 2016-11-13 DIAGNOSIS — I110 Hypertensive heart disease with heart failure: Secondary | ICD-10-CM

## 2016-11-13 DIAGNOSIS — E1165 Type 2 diabetes mellitus with hyperglycemia: Secondary | ICD-10-CM

## 2016-11-13 DIAGNOSIS — I2510 Atherosclerotic heart disease of native coronary artery without angina pectoris: Secondary | ICD-10-CM

## 2016-11-13 NOTE — Progress Notes (Signed)
Location:   [facility] Room Number: 216 B Place of Service:  SNF (31)   CODE STATUS:  Full Code  Allergies  Allergen Reactions  . Ace Inhibitors Swelling    Pt tolerates lisinopril  . Lipitor [Atorvastatin Calcium] Swelling  . Metformin And Related Swelling  . Cefadroxil Hives  . Cephalexin Hives  . Morphine And Related Other (See Comments)    Sweating, feels like is "in rocky boat."  . Robaxin [Methocarbamol] Other (See Comments)    Feels like he is shaky    Chief Complaint  Patient presents with  . Medical Management of Chronic Issues    Routine Visit    HPI:  He is a long term resident of this facility being seen for the management of his chronic illnesses. Overall his status is without significant change. He has been declining some of his insulin doses stating that he doses are too high. He did state that he does not care that his cbg's are too high. He does spend all of his time in bed per his choice. He has recently stopped his prednisone.    Past Medical History:  Diagnosis Date  . Arthritis    "hands, ankles, knees" (11/10/2014)  . Bladder spasms   . CAD (coronary artery disease)   . CAD in native artery 09/15/2015  . CHF (congestive heart failure) ([place])   . Chronic indwelling Foley catheter   . Chronic lower back pain   . Depression   . Diabetic diarrhea ([place])   . Diabetic neuropathy, painful ([place])   . DVT (deep venous thrombosis) ([facility]) 1980's   LLE  . GERD (gastroesophageal reflux disease)   . Hyperlipidemia   . Hypertension   . Neurogenic bladder   . Obese   . OSA (obstructive sleep apnea)    "they wanted me to wear a mask; I couldn't" (11/10/2014)  . Pneumonia 1999  . PONV (postoperative nausea and vomiting)   . Stroke [facility]) 10/2011; 06/2014   right hand numbness/notes 10/20/2011, pt not sure he had stroke in 2013; "right hand weaker and right face not quite right since" (11/10/2014)  . Type II diabetes mellitus ([place])   . Uncontrolled type II  diabetes with peripheral autonomic neuropathy ([place]) 05/09/2015    Past Surgical History:  Procedure Laterality Date  . CARDIAC CATHETERIZATION N/A 05/02/2015   Procedure: Left Heart Cath and Coronary Angiography;  Surgeon: Lorretta Harp, MD;  Location: [facility] CV LAB;  Service: Cardiovascular;  Laterality: N/A;  . CARDIAC CATHETERIZATION N/A 05/04/2015   Procedure: Left Heart Cath and Coronary Angiography;  Surgeon: Wellington Hampshire, MD;  Location: [facility] CV LAB;  Service: Cardiovascular;  Laterality: N/A;  . CARPAL TUNNEL RELEASE Bilateral ~ 2003-2004  . CHOLECYSTECTOMY OPEN  1970's?  Marland Kitchen GASTROPLASTY    . JOINT REPLACEMENT    . LEFT HEART CATHETERIZATION WITH CORONARY ANGIOGRAM N/A 10/24/2011   Procedure: LEFT HEART CATHETERIZATION WITH CORONARY ANGIOGRAM;  Surgeon: Candee Furbish, MD;  Location: [facility] CATH LAB;  Service: Cardiovascular;  Laterality: N/A;  right radial artery approach  . TOTAL KNEE ARTHROPLASTY Right 1990's    Social History   Social History  . Marital status: Widowed    Spouse name: N/A  . Number of children: N/A  . Years of education: N/A   Occupational History  . Retired    Social History Main Topics  . Smoking status: Former Smoker    Packs/day: 2.00    Years: 20.00    Types:  Cigarettes    Quit date: 09/10/1976  . Smokeless tobacco: Never Used  . 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 Drug use: No  . Sexual activity: No   Other Topics Concern  . Not on file   Social History Narrative   Lives alone.  No close family nearby.     Family History  Problem Relation Age of Onset  . Diabetes type I Father   . Hypertension Mother   . Cancer Brother      Testicular Cancer  . Cancer Brother     Leukemia      VITAL SIGNS BP (!) 160/82   Pulse 74   Ht 6\' 1"  (1.854 m)   Wt (!) 346 lb (156.9 kg)   BMI 45.65 kg/m   Patient's Medications  New Prescriptions   No medications on file  Previous Medications    ALUMINUM-MAGNESIUM HYDROXIDE-SIMETHICONE (MAALOX) 200-200-20 MG/5ML SUSP    Take 30 mLs by mouth every 6 (six) hours as needed.   AMLODIPINE (NORVASC) 5 MG TABLET    Take 5 mg by mouth daily.   ASPIRIN EC 81 MG TABLET    Take 81 mg by mouth daily. 0900   ESCITALOPRAM (LEXAPRO) 10 MG TABLET    Take 10 mg by mouth daily.    GABAPENTIN (NEURONTIN) 100 MG CAPSULE    Take 1 capsule (100 mg total) by mouth 2 (two) times daily.   GUAIFENESIN (MUCINEX) 600 MG 12 HR TABLET    Take 600 mg by mouth 2 (two) times daily.   HYDROXYZINE (ATARAX/VISTARIL) 25 MG TABLET    Take 25 mg by mouth 4 (four) times daily.    INSULIN ASPART (NOVOLOG) 100 UNIT/ML INJECTION    Inject 50 Units into the skin 3 (three) times daily after meals.   INSULIN GLARGINE (TOUJEO SOLOSTAR) 300 UNIT/ML SOPN    Inject 50 Units into the skin at bedtime.    IPRATROPIUM-ALBUTEROL (DUONEB) 0.5-2.5 (3) MG/3ML SOLN    Take 3 mLs by nebulization every 6 (six) hours as needed.   METHOTREXATE (RHEUMATREX) 7.5 MG TABLET    Take 7.5 mg by mouth once a week. Caution" Chemotherapy. Protect from light. On Wednesday   METOPROLOL TARTRATE (LOPRESSOR) 25 MG TABLET    0.5 tablet by mouth two times a day 0900 , 2100   MULTIPLE VITAMINS-MINERALS (DECUBI-VITE) CAPS    Take 1 capsule by mouth daily at 3 pm.   NITROGLYCERIN (NITROSTAT) 0.4 MG SL TABLET    Place 0.4 mg under the tongue every 5 (five) minutes as needed for chest pain.   OMEPRAZOLE (PRILOSEC) 40 MG CAPSULE    Take 40 mg by mouth daily. 0600   POTASSIUM CHLORIDE (KLOR-CON) 20 MEQ PACKET    Take 40 mEq by mouth daily. 0800   ROPINIROLE (REQUIP) 0.5 MG TABLET    Take 0.5 mg by mouth at bedtime. 2100   SENNOSIDES-DOCUSATE SODIUM (SENOKOT-S) 8.6-50 MG TABLET    Take 2 tablets by mouth at bedtime. 2100   TORSEMIDE (DEMADEX) 20 MG TABLET    Take 20 mg by mouth 2 (two) times daily.   TRAMADOL-ACETAMINOPHEN (ULTRACET) 37.5-325 MG TABLET    Take 1-2 tablets by mouth every 6 (six) hours as needed for moderate  pain.   TRAZODONE (DESYREL) 50 MG TABLET    Take 50 mg by mouth at bedtime.   Modified Medications   No medications on file  Discontinued Medications   FOLIC ACID (FOLVITE)  1 MG TABLET    Take 1 mg by mouth daily.   INSULIN ASPART (NOVOLOG) 100 UNIT/ML INJECTION    Inject into the skin. Inject as per sliding scale: if  0-70=0 Call MD ; 71-150 = 38 units, 151-400= 46 units; 401 +.  subcutaneously before meals related to DM.   IPRATROPIUM-ALBUTEROL (DUONEB) 0.5-2.5 (3) MG/3ML SOLN    Take 3 mLs by nebulization every 4 (four) hours as needed.   MAGNESIUM OXIDE (MAG-OX) 400 MG TABLET    Take 400 mg by mouth daily. 0900   PREDNISONE (DELTASONE) 20 MG TABLET    Take 20 mg by mouth daily with breakfast.    VITAMIN C (ASCORBIC ACID) 500 MG TABLET    Take 500 mg by mouth 2 (two) times daily. 0900 and 2100     SIGNIFICANT DIAGNOSTIC EXAMS  10-12-15: chest x-ray: Enlarged cardiac silhouette. Pulmonary vascular congestion.  10-18-15: EEG: This is a normal EEG for the patients stated age.  There were no focal, hemispheric or lateralizing features.  No epileptiform activity was recorded.  A normal EEG does not exclude the diagnosis of a seizure disorder and if seizure remains high on the list of differential diagnosis, an ambulatory EEG may be of value.  Clinical correlation is required.  01-11-16: chest x-ray: Minimal left base atelectasis.  Otherwise, normal exam.  01-11-16: ct of head: No acute intracranial abnormality. Stable mild cerebral atrophy and chronic white matter disease. No definite acute cortical infarction.  01-11-16: scrotal ultrasound: 1. Left epididymo-orchitis. 2. Microlithiasis involving the left testicle. 3. Three adjacent complex cysts/spermatoceles involving the left epididymal head.   LABS REVIEWED:   12-29-15: chol 180; ldl 115; trig 66; hdl 52; hgb a1c 8.8  01-11-16: wbc 19.6; hgb 13.6; hct 40.1; mcv 87.6; plt 232; glucose 255; bun 15; creat 0.67; k+ 4.7; na++132; liver normal  albumin 2.9; blood culture: no growth; urine culture: e-coli-esbl; klebsiella pneumoniae  01-15-16: wbc 7.4; hgb 11.6; hct 35.7; mcv 89.9; plt 242; glucose 159; bun 5; creat 0.53; k+ 4.1; na++135  05-27-16: hgb a1c 11.7 07-20-16: stool for c-diff: +  07-30-16: wbc 15.1; hgb 12.4; hct 37.7; mcv 93.5; plt 289; glucose 322; bun 28.3; creat 0.91; k+ 4.3; na++ 137; liver normal albumin 3.4    Review of Systems  Constitutional: Negative for malaise/fatigue.  Respiratory: Negative for cough and shortness of breath.   Cardiovascular: Positive for leg swelling. Negative for chest pain and palpitations.  Gastrointestinal: Negative for heartburn, abdominal pain and constipation.  Musculoskeletal: right thigh pain   Skin: no complaints  Neurological: Negative for dizziness.  Psychiatric/Behavioral: The patient is not nervous/anxious.        Physical Exam  Constitutional: He is oriented to person, place, and time. No distress.  Obese   Eyes: Conjunctivae are normal.  Neck: Neck supple. No JVD present. No thyromegaly present.  Cardiovascular: Normal rate, regular rhythm and intact distal pulses.   Respiratory: Effort normal and breath sounds normal. No respiratory distress. He has no wheezes.  GI: Soft. Bowel sounds are normal. He exhibits no distension. There is no tenderness.  Genitourinary:  Has foley has penile tear    Musculoskeletal: He exhibits edema.  Able to move all extremities Trace bilateral lower extremity edema     Lymphadenopathy:    He has no cervical adenopathy.  Neurological: alert  Skin: Skin is warm and dry. He is not diaphoretic.left heel is boggy   ASSESSMENT/ PLAN:  1. bullous pemphigoid:  He has declined to see  dermatologist any further; is off prednisone; will continue methotrexate 7.5 mg weekly will lower atarax to 25 mg three times daily   2. Hypertension: will continue norvasc 5 mg daily  Lopressor 25 mg twice daily asa 81 mg daily   3. Gerd: will lower to  prilosec 20 mg daily   4.  Diastolic heart failure:  2-d done 05-28-16 (no EF) : will continue demadex 20 mg twice daily with k+ 40 meq daily  Will monitor  5. Diabetes: hgb a1c 11.7; will change to novolog 20 units after meals and tuojeo 20 units nightly he states that he will not take higher doses  6.  OSA: is on cpap;   7. Diabetic neuropathy: will continue neurontin 100 mg twice daily has ultracet 1 or 2 tabs every 6 hours as needed  8. Depression will continue lexapro 10 mg daily has trazodone 50 mg nightly as needed   9.  History of CVA: is neurologically stable; will continue asa 81 mg daily   10.  Allergic rhinitis: will continue mucinex 600 mg twice daily duoneb four times daily as needed   11. RLS: will continue requip 0.5 mg nightly   12. CAD: no complaint of chest pain: will continue asa 81 mg daily and prn ntg  13. Constipation: will continue senna s 2 tabs nightly   13. Neurogenic bladder: has chronic foley     MD is aware of resident's narcotic use and is in agreement with current plan of care. We will attempt to wean resident as apropriate     Ok Edwards NP [facility]  Contact [phone] Monday through Friday 8am- 5pm  After hours call [phone]

## 2016-11-14 DIAGNOSIS — E1169 Type 2 diabetes mellitus with other specified complication: Secondary | ICD-10-CM | POA: Diagnosis not present

## 2016-11-14 LAB — LIPID PANEL
CHOLESTEROL: 226 mg/dL — AB (ref 0–200)
HDL: 36 mg/dL (ref 35–70)
LDL Cholesterol: 163 mg/dL
Triglycerides: 294 mg/dL — AB (ref 40–160)

## 2016-11-30 DIAGNOSIS — G4700 Insomnia, unspecified: Secondary | ICD-10-CM | POA: Diagnosis not present

## 2016-11-30 DIAGNOSIS — F329 Major depressive disorder, single episode, unspecified: Secondary | ICD-10-CM | POA: Diagnosis not present

## 2016-12-12 ENCOUNTER — Non-Acute Institutional Stay (SKILLED_NURSING_FACILITY): Payer: Medicare Other | Admitting: Adult Health

## 2016-12-12 ENCOUNTER — Encounter: Payer: Self-pay | Admitting: Adult Health

## 2016-12-12 DIAGNOSIS — E785 Hyperlipidemia, unspecified: Secondary | ICD-10-CM | POA: Diagnosis not present

## 2016-12-12 DIAGNOSIS — E1143 Type 2 diabetes mellitus with diabetic autonomic (poly)neuropathy: Secondary | ICD-10-CM | POA: Diagnosis not present

## 2016-12-12 DIAGNOSIS — IMO0002 Reserved for concepts with insufficient information to code with codable children: Secondary | ICD-10-CM

## 2016-12-12 DIAGNOSIS — E1141 Type 2 diabetes mellitus with diabetic mononeuropathy: Secondary | ICD-10-CM

## 2016-12-12 DIAGNOSIS — N319 Neuromuscular dysfunction of bladder, unspecified: Secondary | ICD-10-CM | POA: Diagnosis not present

## 2016-12-12 DIAGNOSIS — L109 Pemphigus, unspecified: Secondary | ICD-10-CM

## 2016-12-12 DIAGNOSIS — E1165 Type 2 diabetes mellitus with hyperglycemia: Secondary | ICD-10-CM

## 2016-12-12 DIAGNOSIS — I5032 Chronic diastolic (congestive) heart failure: Secondary | ICD-10-CM

## 2016-12-12 DIAGNOSIS — I110 Hypertensive heart disease with heart failure: Secondary | ICD-10-CM

## 2016-12-12 NOTE — Progress Notes (Signed)
Location:   [facility] Room Number: 216 B Place of Service:  SNF (31)   CODE STATUS: Full Code  Allergies  Allergen Reactions  . Ace Inhibitors Swelling    Pt tolerates lisinopril  . Lipitor [Atorvastatin Calcium] Swelling  . Metformin And Related Swelling  . Cefadroxil Hives  . Cephalexin Hives  . Morphine And Related Other (See Comments)    Sweating, feels like is "in rocky boat."  . Robaxin [Methocarbamol] Other (See Comments)    Feels like he is shaky    Chief Complaint  Patient presents with  . Medical Management of Chronic Issues    1 month follow up    HPI:  He is a long term resident of this facility being seen for the management of his chronic illnesses. His cbg readings remain elevated. He is willing to increase the amount of insulin he takes. He is not voicing any other concerns at this time. He does spend all of his time in bed.    Past Medical History:  Diagnosis Date  . Arthritis    "hands, ankles, knees" (11/10/2014)  . Bladder spasms   . CAD (coronary artery disease)   . CAD in native artery 09/15/2015  . CHF (congestive heart failure) ([place])   . Chronic indwelling Foley catheter   . Chronic lower back pain   . Depression   . Diabetic diarrhea ([place])   . Diabetic neuropathy, painful ([place])   . DVT (deep venous thrombosis) ([facility]) 1980's   LLE  . GERD (gastroesophageal reflux disease)   . Hyperlipidemia   . Hypertension   . Neurogenic bladder   . Obese   . OSA (obstructive sleep apnea)    "they wanted me to wear a mask; I couldn't" (11/10/2014)  . Pneumonia 1999  . PONV (postoperative nausea and vomiting)   . Stroke [facility]) 10/2011; 06/2014   right hand numbness/notes 10/20/2011, pt not sure he had stroke in 2013; "right hand weaker and right face not quite right since" (11/10/2014)  . Type II diabetes mellitus ([place])   . Uncontrolled type II diabetes with peripheral autonomic neuropathy ([place]) 05/09/2015    Past Surgical History:  Procedure  Laterality Date  . CARDIAC CATHETERIZATION N/A 05/02/2015   Procedure: Left Heart Cath and Coronary Angiography;  Surgeon: Lorretta Harp, MD;  Location: [facility] CV LAB;  Service: Cardiovascular;  Laterality: N/A;  . CARDIAC CATHETERIZATION N/A 05/04/2015   Procedure: Left Heart Cath and Coronary Angiography;  Surgeon: Wellington Hampshire, MD;  Location: [facility] CV LAB;  Service: Cardiovascular;  Laterality: N/A;  . CARPAL TUNNEL RELEASE Bilateral ~ 2003-2004  . CHOLECYSTECTOMY OPEN  1970's?  Marland Kitchen GASTROPLASTY    . JOINT REPLACEMENT    . LEFT HEART CATHETERIZATION WITH CORONARY ANGIOGRAM N/A 10/24/2011   Procedure: LEFT HEART CATHETERIZATION WITH CORONARY ANGIOGRAM;  Surgeon: Candee Furbish, MD;  Location: [facility] CATH LAB;  Service: Cardiovascular;  Laterality: N/A;  right radial artery approach  . TOTAL KNEE ARTHROPLASTY Right 1990's    Social History   Social History  . Marital status: Widowed    Spouse name: N/A  . Number of children: N/A  . Years of education: N/A   Occupational History  . Retired    Social History Main Topics  . Smoking status: Former Smoker    Packs/day: 2.00    Years: 20.00    Types: Cigarettes    Quit date: 09/10/1976  . Smokeless tobacco: Never Used  . Alcohol use No  Comment: FORMER ALCOHOLIC; "sober since 89/21/1941"  . Drug use: No  . Sexual activity: No   Other Topics Concern  . Not on file   Social History Narrative   Lives alone.  No close family nearby.     Family History  Problem Relation Age of Onset  . Diabetes type I Father   . Hypertension Mother   . Cancer Brother      Testicular Cancer  . Cancer Brother     Leukemia      VITAL SIGNS BP 103/60   Pulse 78   Temp 97.2 F (36.2 C)   Resp 20   Ht 6\' 1"  (1.854 m)   Wt (!) 346 lb (156.9 kg)   SpO2 97% Comment: on 2L  BMI 45.65 kg/m   Patient's Medications  New Prescriptions   No medications on file  Previous Medications   ALUMINUM-MAGNESIUM HYDROXIDE-SIMETHICONE  (MAALOX) 740-814-48 MG/5ML SUSP    Take 30 mLs by mouth every 6 (six) hours as needed.   AMLODIPINE (NORVASC) 5 MG TABLET    Take 5 mg by mouth daily.   ASPIRIN EC 81 MG TABLET    Take 81 mg by mouth daily. 0900   ESCITALOPRAM (LEXAPRO) 10 MG TABLET    Take 10 mg by mouth daily.    GABAPENTIN (NEURONTIN) 100 MG CAPSULE    Take 1 capsule (100 mg total) by mouth 2 (two) times daily.   GUAIFENESIN (MUCINEX) 600 MG 12 HR TABLET    Take 600 mg by mouth 2 (two) times daily.   HYDROXYZINE (ATARAX/VISTARIL) 25 MG TABLET    Take 25 mg by mouth 3 (three) times daily.    INSULIN GLARGINE (TOUJEO SOLOSTAR) 300 UNIT/ML SOPN    Inject 20 Units into the skin at bedtime.    INSULIN LISPRO (HUMALOG) 100 UNIT/ML INJECTION    Inject 20 Units into the skin 3 (three) times daily before meals.   IPRATROPIUM-ALBUTEROL (DUONEB) 0.5-2.5 (3) MG/3ML SOLN    Take 3 mLs by nebulization every 6 (six) hours as needed.   METHOTREXATE (RHEUMATREX) 7.5 MG TABLET    Take 7.5 mg by mouth once a week. Caution" Chemotherapy. Protect from light. On Wednesday   METOPROLOL TARTRATE (LOPRESSOR) 25 MG TABLET    12.5 mg. 0.5 tablet by mouth two times a day 0900 , 2100    MULTIPLE VITAMINS-MINERALS (DECUBI-VITE) CAPS    Take 1 capsule by mouth daily at 3 pm.   NITROGLYCERIN (NITROSTAT) 0.4 MG SL TABLET    Place 0.4 mg under the tongue every 5 (five) minutes as needed for chest pain.   OMEPRAZOLE (PRILOSEC) 40 MG CAPSULE    Take 40 mg by mouth daily. 0600   POTASSIUM CHLORIDE (KLOR-CON) 20 MEQ PACKET    Take 40 mEq by mouth daily. 0800   ROPINIROLE (REQUIP) 0.5 MG TABLET    Take 0.5 mg by mouth at bedtime. 2100   SENNOSIDES-DOCUSATE SODIUM (SENOKOT-S) 8.6-50 MG TABLET    Take 2 tablets by mouth at bedtime. 2100   TORSEMIDE (DEMADEX) 20 MG TABLET    Take 20 mg by mouth 2 (two) times daily.   TRAMADOL-ACETAMINOPHEN (ULTRACET) 37.5-325 MG TABLET    Take 1-2 tablets by mouth every 6 (six) hours as needed for moderate pain.   TRAZODONE (DESYREL)  50 MG TABLET    Take 50 mg by mouth at bedtime.   Modified Medications   No medications on file  Discontinued Medications   INSULIN ASPART (NOVOLOG) 100 UNIT/ML INJECTION  Inject 50 Units into the skin 3 (three) times daily after meals.     SIGNIFICANT DIAGNOSTIC EXAMS  10-12-15: chest x-ray: Enlarged cardiac silhouette. Pulmonary vascular congestion.  10-18-15: EEG: This is a normal EEG for the patients stated age.  There were no focal, hemispheric or lateralizing features.  No epileptiform activity was recorded.  A normal EEG does not exclude the diagnosis of a seizure disorder and if seizure remains high on the list of differential diagnosis, an ambulatory EEG may be of value.  Clinical correlation is required.  01-11-16: chest x-ray: Minimal left base atelectasis.  Otherwise, normal exam.  01-11-16: ct of head: No acute intracranial abnormality. Stable mild cerebral atrophy and chronic white matter disease. No definite acute cortical infarction.  01-11-16: scrotal ultrasound: 1. Left epididymo-orchitis. 2. Microlithiasis involving the left testicle. 3. Three adjacent complex cysts/spermatoceles involving the left epididymal head.   LABS REVIEWED:   12-29-15: chol 180; ldl 115; trig 66; hdl 52; hgb a1c 8.8  01-11-16: wbc 19.6; hgb 13.6; hct 40.1; mcv 87.6; plt 232; glucose 255; bun 15; creat 0.67; k+ 4.7; na++132; liver normal albumin 2.9; blood culture: no growth; urine culture: e-coli-esbl; klebsiella pneumoniae  01-15-16: wbc 7.4; hgb 11.6; hct 35.7; mcv 89.9; plt 242; glucose 159; bun 5; creat 0.53; k+ 4.1; na++135  05-27-16: hgb a1c 11.7 07-20-16: stool for c-diff: +  07-30-16: wbc 15.1; hgb 12.4; hct 37.7; mcv 93.5; plt 289; glucose 322; bun 28.3; creat 0.91; k+ 4.3; na++ 137; liver normal albumin 3.4    Review of Systems  Constitutional: Negative for malaise/fatigue.  Respiratory: Negative for cough and shortness of breath.   Cardiovascular: Positive for leg swelling. Negative for  chest pain and palpitations.  Gastrointestinal: Negative for heartburn, abdominal pain and constipation.  Musculoskeletal: right thigh pain   Skin: no complaints  Neurological: Negative for dizziness.  Psychiatric/Behavioral: The patient is not nervous/anxious.        Physical Exam  Constitutional: He is oriented to person, place, and time. No distress.  Obese   Eyes: Conjunctivae are normal.  Neck: Neck supple. No JVD present. No thyromegaly present.  Cardiovascular: Normal rate, regular rhythm and intact distal pulses.   Respiratory: Effort normal and breath sounds normal. No respiratory distress. He has no wheezes.  GI: Soft. Bowel sounds are normal. He exhibits no distension. There is no tenderness.  Genitourinary:  Has foley has penile tear    Musculoskeletal: He exhibits edema.  Able to move all extremities Trace bilateral lower extremity edema     Lymphadenopathy:    He has no cervical adenopathy.  Neurological: alert  Skin: Skin is warm and dry. He is not diaphoretic.   ASSESSMENT/ PLAN:  1. bullous pemphigoid:  He has declined to see dermatologist any further; is off prednisone; will continue methotrexate 7.5 mg weekly and atarax 25 mg three times daily   2. Hypertension: b/p 103/60 will continue norvasc 5 mg daily  Lopressor 25 mg twice daily asa 81 mg daily   3. Gerd: will continue  prilosec 40 mg daily   4.  Diastolic heart failure:  2-d done 05-28-16 (no EF) : will continue demadex 20 mg twice daily with k+ 40 meq daily  Will monitor  5. Diabetes: hgb a1c 11.7; will change to humalog 25  units after meals and tuojeo 25 units nightly   6.  OSA: is on cpap;   7. Diabetic neuropathy: will continue neurontin 100 mg twice daily has ultracet 1 or 2 tabs every  6 hours as needed  8. Depression will continue lexapro 10 mg daily has trazodone 50 mg nightly as needed   9.  History of CVA: is neurologically stable; will continue asa 81 mg daily   10.  Allergic  rhinitis: will continue mucinex 600 mg twice daily duoneb four times daily as needed   11. RLS: will continue requip 0.5 mg nightly   12. CAD: no complaint of chest pain: will continue asa 81 mg daily and prn ntg  13. Constipation: will continue senna s 2 tabs nightly   13. Neurogenic bladder: has chronic foley   14. Dyslipidemia: will begin zetia 10 mg daily    Will check cbc; cmp; hgb a1c urine micro-albumin FOBT   MD is aware of resident's narcotic use and is in agreement with current plan of care. We will attempt to wean resident as apropriate    Ok Edwards NP [facility]  Contact [phone] Monday through Friday 8am- 5pm  After hours call [phone]

## 2016-12-13 DIAGNOSIS — L120 Bullous pemphigoid: Secondary | ICD-10-CM | POA: Diagnosis not present

## 2016-12-13 DIAGNOSIS — E08628 Diabetes mellitus due to underlying condition with other skin complications: Secondary | ICD-10-CM | POA: Diagnosis not present

## 2016-12-13 DIAGNOSIS — I5033 Acute on chronic diastolic (congestive) heart failure: Secondary | ICD-10-CM | POA: Diagnosis not present

## 2016-12-13 DIAGNOSIS — D649 Anemia, unspecified: Secondary | ICD-10-CM | POA: Diagnosis not present

## 2016-12-13 LAB — HEPATIC FUNCTION PANEL
ALT: 19 U/L (ref 10–40)
AST: 23 U/L (ref 14–40)
Alkaline Phosphatase: 90 U/L (ref 25–125)
Bilirubin, Total: 0.2 mg/dL

## 2016-12-13 LAB — CBC AND DIFFERENTIAL
HEMATOCRIT: 35 % — AB (ref 41–53)
Hemoglobin: 11.8 g/dL — AB (ref 13.5–17.5)
Neutrophils Absolute: 9 /uL
Platelets: 297 10*3/uL (ref 150–399)
WBC: 10.7 10^3/mL

## 2016-12-13 LAB — BASIC METABOLIC PANEL
BUN: 15 mg/dL (ref 4–21)
CREATININE: 0.9 mg/dL (ref 0.6–1.3)
Glucose: 325 mg/dL
POTASSIUM: 3.8 mmol/L (ref 3.4–5.3)
SODIUM: 138 mmol/L (ref 137–147)

## 2016-12-31 ENCOUNTER — Other Ambulatory Visit: Payer: Self-pay

## 2016-12-31 MED ORDER — TRAMADOL-ACETAMINOPHEN 37.5-325 MG PO TABS: 1.0000 | ORAL_TABLET | Freq: Four times a day (QID) | ORAL | 0 refills | Status: DC | PRN

## 2016-12-31 NOTE — Telephone Encounter (Signed)
RX faxed to [facility] fax # [phone] Phone #[phone] 

## 2017-01-09 ENCOUNTER — Non-Acute Institutional Stay (SKILLED_NURSING_FACILITY): Payer: Medicare Other | Admitting: Adult Health

## 2017-01-09 ENCOUNTER — Encounter: Payer: Self-pay | Admitting: Adult Health

## 2017-01-09 DIAGNOSIS — I110 Hypertensive heart disease with heart failure: Secondary | ICD-10-CM

## 2017-01-09 DIAGNOSIS — L109 Pemphigus, unspecified: Secondary | ICD-10-CM | POA: Diagnosis not present

## 2017-01-09 DIAGNOSIS — I5032 Chronic diastolic (congestive) heart failure: Secondary | ICD-10-CM

## 2017-01-09 DIAGNOSIS — E1141 Type 2 diabetes mellitus with diabetic mononeuropathy: Secondary | ICD-10-CM

## 2017-01-09 DIAGNOSIS — E1143 Type 2 diabetes mellitus with diabetic autonomic (poly)neuropathy: Secondary | ICD-10-CM

## 2017-01-09 DIAGNOSIS — E785 Hyperlipidemia, unspecified: Secondary | ICD-10-CM | POA: Diagnosis not present

## 2017-01-09 DIAGNOSIS — Z9989 Dependence on other enabling machines and devices: Secondary | ICD-10-CM

## 2017-01-09 DIAGNOSIS — I2510 Atherosclerotic heart disease of native coronary artery without angina pectoris: Secondary | ICD-10-CM | POA: Diagnosis not present

## 2017-01-09 DIAGNOSIS — E1165 Type 2 diabetes mellitus with hyperglycemia: Secondary | ICD-10-CM

## 2017-01-09 DIAGNOSIS — E1169 Type 2 diabetes mellitus with other specified complication: Secondary | ICD-10-CM | POA: Diagnosis not present

## 2017-01-09 DIAGNOSIS — G4733 Obstructive sleep apnea (adult) (pediatric): Secondary | ICD-10-CM | POA: Diagnosis not present

## 2017-01-09 DIAGNOSIS — IMO0002 Reserved for concepts with insufficient information to code with codable children: Secondary | ICD-10-CM

## 2017-01-09 NOTE — Progress Notes (Signed)
Location:   [facility] Room Number: 216 B Place of Service:  SNF (31)   CODE STATUS: Full Code  Allergies  Allergen Reactions  . Ace Inhibitors Swelling    Pt tolerates lisinopril  . Lipitor [Atorvastatin Calcium] Swelling  . Metformin And Related Swelling  . Cefadroxil Hives  . Cephalexin Hives  . Morphine And Related Other (See Comments)    Sweating, feels like is "in rocky boat."  . Robaxin [Methocarbamol] Other (See Comments)    Feels like he is shaky    Chief Complaint  Patient presents with  . Medical Management of Chronic Issues    1 month follow up    HPI:  He is a long term resident of this facility being seen for the management of his chronic illnesses. His pemphigoid rash is getting worse once again since stopping his prednisone. He is scratching all over and picking his skin open. His cbgs remain elevated. We did discuss his insulin regimen and he has agreed to increase his insulins to 30 units.    Past Medical History:  Diagnosis Date  . Arthritis    "hands, ankles, knees" (11/10/2014)  . Bladder spasms   . CAD (coronary artery disease)   . CAD in native artery 09/15/2015  . CHF (congestive heart failure) ([place])   . Chronic indwelling Foley catheter   . Chronic lower back pain   . Depression   . Diabetic diarrhea ([place])   . Diabetic neuropathy, painful ([place])   . DVT (deep venous thrombosis) (HCC) 1980's   LLE  . GERD (gastroesophageal reflux disease)   . Hyperlipidemia   . Hypertension   . Neurogenic bladder   . Obese   . OSA (obstructive sleep apnea)    "they wanted me to wear a mask; I couldn't" (11/10/2014)  . Pneumonia 1999  . PONV (postoperative nausea and vomiting)   . Stroke [facility]) 10/2011; 06/2014   right hand numbness/notes 10/20/2011, pt not sure he had stroke in 2013; "right hand weaker and right face not quite right since" (11/10/2014)  . Type II diabetes mellitus ([place])   . 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 CARDIAC CATHETERIZATION N/A 05/02/2015   Procedure: Left Heart Cath and Coronary Angiography;  Surgeon: Lorretta Harp, MD;  Location: [facility] CV LAB;  Service: Cardiovascular;  Laterality: N/A;  . CARDIAC CATHETERIZATION N/A 05/04/2015   Procedure: Left Heart Cath and Coronary Angiography;  Surgeon: Wellington Hampshire, MD;  Location: [place] CV LAB;  Service: Cardiovascular;  Laterality: N/A;  . CARPAL TUNNEL RELEASE Bilateral ~ 2003-2004  . CHOLECYSTECTOMY OPEN  1970's?  Marland Kitchen GASTROPLASTY    . JOINT REPLACEMENT    . LEFT HEART CATHETERIZATION WITH CORONARY ANGIOGRAM N/A 10/24/2011   Procedure: LEFT HEART CATHETERIZATION WITH CORONARY ANGIOGRAM;  Surgeon: Candee Furbish, MD;  Location: [facility] CATH LAB;  Service: Cardiovascular;  Laterality: N/A;  right radial artery approach  . TOTAL KNEE ARTHROPLASTY Right 1990's    Social History   Social History  . Marital status: Widowed    Spouse name: N/A  . Number of children: N/A  . Years of education: N/A   Occupational History  . Retired    Social History Main Topics  . Smoking status: Former Smoker    Packs/day: 2.00    Years: 20.00    Types: Cigarettes    Quit date: 09/10/1976  . Smokeless tobacco:  Never Used  . Alcohol use No     Comment: FORMER ALCOHOLIC; "sober since 96/78/9381"  . Drug use: No  . Sexual activity: No   Other Topics Concern  . Not on file   Social History Narrative   Lives alone.  No close family nearby.     Family History  Problem Relation Age of Onset  . Diabetes type I Father   . Hypertension Mother   . Cancer Brother         Testicular Cancer  . Cancer Brother        Leukemia      VITAL SIGNS BP 109/76   Pulse 77   Temp 98.8 F (37.1 C)   Resp 16   Ht 6\' 1"  (1.854 m)   Wt (!) 346 lb (156.9 kg)   SpO2 98%   BMI 45.65 kg/m   Patient's Medications  New Prescriptions   No medications on file  Previous  Medications   ALUMINUM-MAGNESIUM HYDROXIDE-SIMETHICONE (MAALOX) 200-200-20 MG/5ML SUSP    Take 30 mLs by mouth every 6 (six) hours as needed.   AMLODIPINE (NORVASC) 5 MG TABLET    Take 5 mg by mouth daily.   ASPIRIN EC 81 MG TABLET    Take 81 mg by mouth daily. 0900   ESCITALOPRAM (LEXAPRO) 10 MG TABLET    Take 10 mg by mouth daily.    EZETIMIBE (ZETIA) 10 MG TABLET    Take 10 mg by mouth daily.   GABAPENTIN (NEURONTIN) 100 MG CAPSULE    Take 1 capsule (100 mg total) by mouth 2 (two) times daily.   GUAIFENESIN (MUCINEX) 600 MG 12 HR TABLET    Take 600 mg by mouth 2 (two) times daily.   HYDROXYZINE (ATARAX/VISTARIL) 25 MG TABLET    Take 25 mg by mouth 3 (three) times daily.    INSULIN GLARGINE (TOUJEO SOLOSTAR) 300 UNIT/ML SOPN    Inject 25 Units into the skin at bedtime.    INSULIN LISPRO (HUMALOG) 100 UNIT/ML INJECTION    Inject 25 Units into the skin 3 (three) times daily before meals.    IPRATROPIUM-ALBUTEROL (DUONEB) 0.5-2.5 (3) MG/3ML SOLN    Take 3 mLs by nebulization every 6 (six) hours as needed.   METHOTREXATE (RHEUMATREX) 7.5 MG TABLET    Take 7.5 mg by mouth once a week. Caution" Chemotherapy. Protect from light. On Wednesday   METOPROLOL TARTRATE (LOPRESSOR) 25 MG TABLET    12.5 mg. 0.5 tablet by mouth two times a day 0900 , 2100    MULTIPLE VITAMINS-MINERALS (DECUBI-VITE) CAPS    Take 1 capsule by mouth daily at 3 pm.   NITROGLYCERIN (NITROSTAT) 0.4 MG SL TABLET    Place 0.4 mg under the tongue every 5 (five) minutes as needed for chest pain.   NYSTATIN POWD    Apply 1 application transdermally two times a day for rash.  Apply to reddened areas under right side folds   OMEPRAZOLE (PRILOSEC) 20 MG CAPSULE    Take 20 mg by mouth daily.   POTASSIUM CHLORIDE (KLOR-CON) 20 MEQ PACKET    Take 40 mEq by mouth daily. 0800   ROPINIROLE (REQUIP) 0.5 MG TABLET    Take 0.5 mg by mouth at bedtime. 2100   SENNOSIDES-DOCUSATE SODIUM (SENOKOT-S) 8.6-50 MG TABLET    Take 2 tablets by mouth at  bedtime. 2100   TORSEMIDE (DEMADEX) 20 MG TABLET    Take 20 mg by mouth 2 (two) times daily.   TRAMADOL-ACETAMINOPHEN (ULTRACET) 37.5-325  MG TABLET    Take 1-2 tablets by mouth every 6 (six) hours as needed for moderate pain.   TRAZODONE (DESYREL) 50 MG TABLET    Take 50 mg by mouth at bedtime.   Modified Medications   No medications on file  Discontinued Medications   OMEPRAZOLE (PRILOSEC) 40 MG CAPSULE    Take 40 mg by mouth daily. 0600     SIGNIFICANT DIAGNOSTIC EXAMS  10-12-15: chest x-ray: Enlarged cardiac silhouette. Pulmonary vascular congestion.  10-18-15: EEG: This is a normal EEG for the patients stated age.  There were no focal, hemispheric or lateralizing features.  No epileptiform activity was recorded.  A normal EEG does not exclude the diagnosis of a seizure disorder and if seizure remains high on the list of differential diagnosis, an ambulatory EEG may be of value.  Clinical correlation is required.  01-11-16: chest x-ray: Minimal left base atelectasis.  Otherwise, normal exam.  01-11-16: ct of head: No acute intracranial abnormality. Stable mild cerebral atrophy and chronic white matter disease. No definite acute cortical infarction.  01-11-16: scrotal ultrasound: 1. Left epididymo-orchitis. 2. Microlithiasis involving the left testicle. 3. Three adjacent complex cysts/spermatoceles involving the left epididymal head.   LABS REVIEWED:    01-11-16: wbc 19.6; hgb 13.6; hct 40.1; mcv 87.6; plt 232; glucose 255; bun 15; creat 0.67; k+ 4.7; na++132; liver normal albumin 2.9; blood culture: no growth; urine culture: e-coli-esbl; klebsiella pneumoniae  01-15-16: wbc 7.4; hgb 11.6; hct 35.7; mcv 89.9; plt 242; glucose 159; bun 5; creat 0.53; k+ 4.1; na++135  05-27-16: hgb a1c 11.7 07-20-16: stool for c-diff: +  07-30-16: wbc 15.1; hgb 12.4; hct 37.7; mcv 93.5; plt 289; glucose 322; bun 28.3; creat 0.91; k+ 4.3; na++ 137; liver normal albumin 3.4    Review of Systems  Constitutional:  Negative for malaise/fatigue.  Respiratory: Negative for cough and shortness of breath.   Cardiovascular: Positive for leg swelling. Negative for chest pain and palpitations.  Gastrointestinal: Negative for heartburn, abdominal pain and constipation.  Musculoskeletal:no complaints of pain  Skin: no complaints rash on legs and arms is worse Neurological: Negative for dizziness.  Psychiatric/Behavioral: The patient is not nervous/anxious.        Physical Exam  Constitutional: He is oriented to person, place, and time. No distress.  Obese   Eyes: Conjunctivae are normal.  Neck: Neck supple. No JVD present. No thyromegaly present.  Cardiovascular: Normal rate, regular rhythm and intact distal pulses.   Respiratory: Effort normal and breath sounds normal. No respiratory distress. He has no wheezes.  GI: Soft. Bowel sounds are normal. He exhibits no distension. There is no tenderness.  Genitourinary:  Has foley has penile tear    Musculoskeletal: He exhibits edema.  Able to move all extremities Trace bilateral lower extremity edema     Lymphadenopathy:    He has no cervical adenopathy.  Neurological: alert  Skin: Skin is warm and dry. He is not diaphoretic.  His pemphigoid rash is worse especially on his legs and arms.    ASSESSMENT/ PLAN:  1. bullous pemphigoid:  He has declined to see dermatologist any further; will continue  atarax 25 mg three times daily   Will increase methotrexate to 10 mg weekly. Will return him back on prednisone 40 mg daily for 3 weeks then 30 mg daily for 2 weeks then 20 mg daily for 3 weeks will then leave him on long term prednisone 10 mg daily   2. Hypertension: b/p 109/76 will continue norvasc 5  mg daily  Lopressor 25 mg twice daily asa 81 mg daily   3. Gerd: will continue  prilosec 40 mg daily   4.  Diastolic heart failure:  2-d done 05-28-16 (no EF) : will continue demadex 20 mg twice daily with k+ 40 meq daily  Will monitor  5. Diabetes: hgb a1c  11.7; will change to humalog 30  units after meals and tuojeo 30 units nightly   6.  OSA: is on cpap;   7. Diabetic neuropathy: will continue neurontin 100 mg twice daily has ultracet 1 or 2 tabs every 6 hours as needed  8. Depression will continue lexapro 10 mg daily has trazodone 50 mg nightly as needed   9.  History of CVA: is neurologically stable; will continue asa 81 mg daily   10.  Allergic rhinitis: will continue mucinex 600 mg twice daily duoneb four times daily as needed   11. RLS: will continue requip 0.5 mg nightly   12. CAD: no complaint of chest pain: will continue asa 81 mg daily and prn ntg  13. Constipation: will continue senna s 2 tabs nightly   13. Neurogenic bladder: has chronic foley   14. Dyslipidemia: will continue zetia 10 mg daily    MD is aware of resident's narcotic use and is in agreement with current plan of care. We will attempt to wean resident as apropriate   Ok Edwards NP [facility]  Contact [phone] Monday through Friday 8am- 5pm  After hours call [phone]

## 2017-01-17 DIAGNOSIS — E1169 Type 2 diabetes mellitus with other specified complication: Secondary | ICD-10-CM | POA: Insufficient documentation

## 2017-01-17 DIAGNOSIS — E785 Hyperlipidemia, unspecified: Secondary | ICD-10-CM

## 2017-01-22 DIAGNOSIS — L98419 Non-pressure chronic ulcer of buttock with unspecified severity: Secondary | ICD-10-CM | POA: Diagnosis not present

## 2017-01-22 DIAGNOSIS — L988 Other specified disorders of the skin and subcutaneous tissue: Secondary | ICD-10-CM | POA: Diagnosis not present

## 2017-01-25 ENCOUNTER — Encounter: Payer: Self-pay | Admitting: Adult Health

## 2017-01-25 ENCOUNTER — Non-Acute Institutional Stay (SKILLED_NURSING_FACILITY): Payer: Medicare Other | Admitting: Adult Health

## 2017-01-25 DIAGNOSIS — E1143 Type 2 diabetes mellitus with diabetic autonomic (poly)neuropathy: Secondary | ICD-10-CM | POA: Diagnosis not present

## 2017-01-25 DIAGNOSIS — I110 Hypertensive heart disease with heart failure: Secondary | ICD-10-CM

## 2017-01-25 DIAGNOSIS — E1165 Type 2 diabetes mellitus with hyperglycemia: Secondary | ICD-10-CM | POA: Diagnosis not present

## 2017-01-25 DIAGNOSIS — L109 Pemphigus, unspecified: Secondary | ICD-10-CM

## 2017-01-25 DIAGNOSIS — IMO0002 Reserved for concepts with insufficient information to code with codable children: Secondary | ICD-10-CM

## 2017-01-25 NOTE — Progress Notes (Signed)
Location:   [facility] Room Number: 216 B Place of Service:  SNF (31)   CODE STATUS: Full Code  Allergies  Allergen Reactions  . Ace Inhibitors Swelling    Pt tolerates lisinopril  . Lipitor [Atorvastatin Calcium] Swelling  . Metformin And Related Swelling  . Cefadroxil Hives  . Cephalexin Hives  . Morphine And Related Other (See Comments)    Sweating, feels like is "in rocky boat."  . Robaxin [Methocarbamol] Other (See Comments)    Feels like he is shaky    Chief Complaint  Patient presents with  . Acute Visit    Elevated Blood Sugar Readings    HPI:  I have been asked to see him regarding his elevated cbg's. He is currently on a prednisone taper for his pemphigoid flare. He tells me that he is feeling better and does realize that his elevated readings are not good for his overall health. He is willing to increase his insulin dosing to 35 units.   Past Medical History:  Diagnosis Date  . Arthritis    "hands, ankles, knees" (11/10/2014)  . Bladder spasms   . CAD (coronary artery disease)   . CAD in native artery 09/15/2015  . CHF (congestive heart failure) ([place])   . Chronic indwelling Foley catheter   . Chronic lower back pain   . Depression   . Diabetic diarrhea ([place])   . Diabetic neuropathy, painful ([place])   . DVT (deep venous thrombosis) ([facility]) 1980's   LLE  . GERD (gastroesophageal reflux disease)   . Hyperlipidemia   . Hypertension   . Neurogenic bladder   . Obese   . OSA (obstructive sleep apnea)    "they wanted me to wear a mask; I couldn't" (11/10/2014)  . Pneumonia 1999  . PONV (postoperative nausea and vomiting)   . Stroke [facility]) 10/2011; 06/2014   right hand numbness/notes 10/20/2011, pt not sure he had stroke in 2013; "right hand weaker and right face not quite right since" (11/10/2014)  . Type II diabetes mellitus ([place])   . Uncontrolled type II diabetes with peripheral autonomic neuropathy ([place]) 05/09/2015    Past Surgical History:  Procedure  Laterality Date  . CARDIAC CATHETERIZATION N/A 05/02/2015   Procedure: Left Heart Cath and Coronary Angiography;  Surgeon: Lorretta Harp, MD;  Location: [place] CV LAB;  Service: Cardiovascular;  Laterality: N/A;  . CARDIAC CATHETERIZATION N/A 05/04/2015   Procedure: Left Heart Cath and Coronary Angiography;  Surgeon: Wellington Hampshire, MD;  Location: [place] CV LAB;  Service: Cardiovascular;  Laterality: N/A;  . CARPAL TUNNEL RELEASE Bilateral ~ 2003-2004  . CHOLECYSTECTOMY OPEN  1970's?  Marland Kitchen GASTROPLASTY    . JOINT REPLACEMENT    . LEFT HEART CATHETERIZATION WITH CORONARY ANGIOGRAM N/A 10/24/2011   Procedure: LEFT HEART CATHETERIZATION WITH CORONARY ANGIOGRAM;  Surgeon: Candee Furbish, MD;  Location: [facility] CATH LAB;  Service: Cardiovascular;  Laterality: N/A;  right radial artery approach  . TOTAL KNEE ARTHROPLASTY Right 1990's    Social History   Social History  . Marital status: Widowed    Spouse name: N/A  . Number of children: N/A  . Years of education: N/A   Occupational History  . Retired    Social History Main Topics  . Smoking status: Former Smoker    Packs/day: 2.00    Years: 20.00    Types: Cigarettes    Quit date: 09/10/1976  . Smokeless tobacco: Never Used  . Alcohol use No  Comment: FORMER ALCOHOLIC; "sober since 81/09/7508"  . Drug use: No  . Sexual activity: No   Other Topics Concern  . Not on file   Social History Narrative   Lives alone.  No close family nearby.     Family History  Problem Relation Age of Onset  . Diabetes type I Father   . Hypertension Mother   . Cancer Brother         Testicular Cancer  . Cancer Brother        Leukemia      VITAL SIGNS BP 107/73   Pulse 71   Temp 97.6 F (36.4 C)   Resp 17   Ht 6\' 1"  (1.854 m)   Wt (!) 346 lb (156.9 kg)   SpO2 97%   BMI 45.65 kg/m   Patient's Medications  New Prescriptions   No medications on file  Previous Medications   ALUMINUM-MAGNESIUM HYDROXIDE-SIMETHICONE (MAALOX)  258-527-78 MG/5ML SUSP    Take 30 mLs by mouth every 6 (six) hours as needed.   AMLODIPINE (NORVASC) 5 MG TABLET    Take 5 mg by mouth daily.   ASPIRIN EC 81 MG TABLET    Take 81 mg by mouth daily. 0900   ESCITALOPRAM (LEXAPRO) 10 MG TABLET    Take 10 mg by mouth daily.    EZETIMIBE (ZETIA) 10 MG TABLET    Take 10 mg by mouth daily.   GABAPENTIN (NEURONTIN) 100 MG CAPSULE    Take 1 capsule (100 mg total) by mouth 2 (two) times daily.   GUAIFENESIN (MUCINEX) 600 MG 12 HR TABLET    Take 600 mg by mouth 2 (two) times daily.   HYDROXYZINE (ATARAX/VISTARIL) 25 MG TABLET    Take 25 mg by mouth 3 (three) times daily.    INSULIN GLARGINE (TOUJEO SOLOSTAR) 300 UNIT/ML SOPN    Inject 30 Units into the skin at bedtime.    INSULIN LISPRO (HUMALOG) 100 UNIT/ML INJECTION    Inject 30 Units into the skin 3 (three) times daily before meals.    IPRATROPIUM-ALBUTEROL (DUONEB) 0.5-2.5 (3) MG/3ML SOLN    Take 3 mLs by nebulization every 6 (six) hours as needed.   METHOTREXATE (RHEUMATREX) 7.5 MG TABLET    Take 7.5 mg by mouth once a week. Caution" Chemotherapy. Protect from light. On Wednesday   METOPROLOL TARTRATE (LOPRESSOR) 25 MG TABLET    12.5 mg. 0.5 tablet by mouth two times a day 0900 , 2100    MULTIPLE VITAMINS-MINERALS (DECUBI-VITE) CAPS    Take 1 capsule by mouth daily at 3 pm.   NITROGLYCERIN (NITROSTAT) 0.4 MG SL TABLET    Place 0.4 mg under the tongue every 5 (five) minutes as needed for chest pain.   NYSTATIN POWD    Apply 1 application transdermally two times a day for rash.  Apply to reddened areas under right side folds   OMEPRAZOLE (PRILOSEC) 20 MG CAPSULE    Take 20 mg by mouth daily.   OXYGEN    Inhale into the lungs. O2 at 2l/min via nasal canula as needed for shortness of breath   POTASSIUM CHLORIDE (KLOR-CON) 20 MEQ PACKET    Take 40 mEq by mouth daily. 0800   PREDNISONE PO    Take by mouth. Tapering Dose: Give 10 mg 1 tablet by mouth daily for rash Give 40 mg by mouth one time daily for rash  x 3 weeks beginning 01-10-17 thru 01-31-17 Give 20 mg by mouth one time a day for rash  x 3 weeks beginning 01-31-17 thru 02-21-17   ROPINIROLE (REQUIP) 0.5 MG TABLET    Take 0.5 mg by mouth at bedtime. 2100   SENNOSIDES-DOCUSATE SODIUM (SENOKOT-S) 8.6-50 MG TABLET    Take 2 tablets by mouth at bedtime. 2100   SKIN PROTECTANTS, MISC. (CALAZIME SKIN PROTECTANT EX)    Apply topically. Apply to Sacral area daily as needed for redness   TORSEMIDE (DEMADEX) 20 MG TABLET    Take 20 mg by mouth 2 (two) times daily.   TRAMADOL-ACETAMINOPHEN (ULTRACET) 37.5-325 MG TABLET    Take 1-2 tablets by mouth every 6 (six) hours as needed for moderate pain.   TRAZODONE (DESYREL) 50 MG TABLET    Take 50 mg by mouth at bedtime.   Modified Medications   No medications on file  Discontinued Medications   No medications on file     SIGNIFICANT DIAGNOSTIC EXAMS  10-12-15: chest x-ray: Enlarged cardiac silhouette. Pulmonary vascular congestion.  10-18-15: EEG: This is a normal EEG for the patients stated age.  There were no focal, hemispheric or lateralizing features.  No epileptiform activity was recorded.  A normal EEG does not exclude the diagnosis of a seizure disorder and if seizure remains high on the list of differential diagnosis, an ambulatory EEG may be of value.  Clinical correlation is required.  01-11-16: chest x-ray: Minimal left base atelectasis.  Otherwise, normal exam.  01-11-16: ct of head: No acute intracranial abnormality. Stable mild cerebral atrophy and chronic white matter disease. No definite acute cortical infarction.  01-11-16: scrotal ultrasound: 1. Left epididymo-orchitis. 2. Microlithiasis involving the left testicle. 3. Three adjacent complex cysts/spermatoceles involving the left epididymal head.   LABS REVIEWED:    01-11-16: wbc 19.6; hgb 13.6; hct 40.1; mcv 87.6; plt 232; glucose 255; bun 15; creat 0.67; k+ 4.7; na++132; liver normal albumin 2.9; blood culture: no growth; urine culture:  e-coli-esbl; klebsiella pneumoniae  01-15-16: wbc 7.4; hgb 11.6; hct 35.7; mcv 89.9; plt 242; glucose 159; bun 5; creat 0.53; k+ 4.1; na++135  05-27-16: hgb a1c 11.7 07-20-16: stool for c-diff: +  07-30-16: wbc 15.1; hgb 12.4; hct 37.7; mcv 93.5; plt 289; glucose 322; bun 28.3; creat 0.91; k+ 4.3; na++ 137; liver normal albumin 3.4    Review of Systems  Constitutional: Negative for malaise/fatigue.  Respiratory: Negative for cough and shortness of breath.   Cardiovascular: negative for leg swelling . Negative for chest pain and palpitations.  Gastrointestinal: Negative for heartburn, abdominal pain and constipation.  Musculoskeletal:no complaints of pain  Skin: no complaints rash on legs and arms is better  Neurological: Negative for dizziness.  Psychiatric/Behavioral: The patient is not nervous/anxious.        Physical Exam  Constitutional: He is oriented to person, place, and time. No distress.  Obese   Eyes: Conjunctivae are normal.  Neck: Neck supple. No JVD present. No thyromegaly present.  Cardiovascular: Normal rate, regular rhythm and intact distal pulses.   Respiratory: Effort normal and breath sounds normal. No respiratory distress. He has no wheezes.  GI: Soft. Bowel sounds are normal. He exhibits no distension. There is no tenderness.  Genitourinary:  Has foley has penile tear    Musculoskeletal: He exhibits edema.  Able to move all extremities Trace bilateral lower extremity edema     Lymphadenopathy:    He has no cervical adenopathy.  Neurological: alert  Skin: Skin is warm and dry. He is not diaphoretic.  His pemphigoid rash is improving on his legs and arms.  ASSESSMENT/ PLAN:  1. bullous pemphigoid:  He has declined to see dermatologist any further; will continue  atarax 25 mg three times daily   Will continue methotrexate  10 mg weekly. Will continue on prednisone 40 mg daily for 3 weeks then 30 mg daily for 2 weeks then 20 mg daily for 3 weeks will then  leave him on long term prednisone 10 mg daily to complete on 02-21-17  2. Hypertension: b/p 107/73 will continue norvasc 5 mg daily  Lopressor 25 mg twice daily asa 81 mg daily   3. Diabetes: hgb a1c 11.7; will change to humalog 35  units after meals and tuojeo 35 units nightly   Will check cbc cmp hgb a1c urine micro-albumin    MD is aware of resident's narcotic use and is in agreement with current plan of care. We will attempt to wean resident as apropriate    Ok Edwards NP [facility]  Contact [phone] Monday through Friday 8am- 5pm  After hours call [phone]

## 2017-01-29 DIAGNOSIS — E119 Type 2 diabetes mellitus without complications: Secondary | ICD-10-CM | POA: Diagnosis not present

## 2017-01-29 DIAGNOSIS — E1169 Type 2 diabetes mellitus with other specified complication: Secondary | ICD-10-CM | POA: Diagnosis not present

## 2017-01-29 DIAGNOSIS — L98419 Non-pressure chronic ulcer of buttock with unspecified severity: Secondary | ICD-10-CM | POA: Diagnosis not present

## 2017-01-29 DIAGNOSIS — E08628 Diabetes mellitus due to underlying condition with other skin complications: Secondary | ICD-10-CM | POA: Diagnosis not present

## 2017-01-29 DIAGNOSIS — D649 Anemia, unspecified: Secondary | ICD-10-CM | POA: Diagnosis not present

## 2017-01-29 DIAGNOSIS — E08311 Diabetes mellitus due to underlying condition with unspecified diabetic retinopathy with macular edema: Secondary | ICD-10-CM | POA: Diagnosis not present

## 2017-01-29 LAB — BASIC METABOLIC PANEL
BUN: 29 mg/dL — AB (ref 4–21)
BUN: 29 — AB (ref 4–21)
CREATININE: 1 mg/dL (ref 0.6–1.3)
Creatinine: 1 (ref 0.6–1.3)
Glucose: 418
Glucose: 418 mg/dL
Potassium: 4.1 (ref 3.4–5.3)
Potassium: 4.1 mmol/L (ref 3.4–5.3)
Sodium: 136 mmol/L — AB (ref 137–147)
Sodium: 136 — AB (ref 137–147)

## 2017-01-29 LAB — HEPATIC FUNCTION PANEL
ALT: 19 U/L (ref 10–40)
ALT: 13 (ref 10–40)
AST: 9 U/L — AB (ref 14–40)
Alkaline Phosphatase: 90 U/L (ref 25–125)
Bilirubin, Total: 0.2 mg/dL

## 2017-01-29 LAB — CBC AND DIFFERENTIAL
HCT: 38 % — AB (ref 41–53)
HEMOGLOBIN: 12.6 g/dL — AB (ref 13.5–17.5)
Neutrophils Absolute: 12 /uL
PLATELETS: 255 10*3/uL (ref 150–399)
WBC: 15 10^3/mL

## 2017-01-29 LAB — HEMOGLOBIN A1C: Hemoglobin A1C: 9.1

## 2017-01-30 ENCOUNTER — Encounter: Payer: Self-pay | Admitting: Adult Health

## 2017-01-30 NOTE — Progress Notes (Signed)
Entered in error

## 2017-02-05 DIAGNOSIS — L98419 Non-pressure chronic ulcer of buttock with unspecified severity: Secondary | ICD-10-CM | POA: Diagnosis not present

## 2017-02-06 DIAGNOSIS — M25512 Pain in left shoulder: Secondary | ICD-10-CM | POA: Diagnosis not present

## 2017-02-06 DIAGNOSIS — R55 Syncope and collapse: Secondary | ICD-10-CM | POA: Diagnosis not present

## 2017-02-06 DIAGNOSIS — M6281 Muscle weakness (generalized): Secondary | ICD-10-CM | POA: Diagnosis not present

## 2017-02-07 DIAGNOSIS — M25512 Pain in left shoulder: Secondary | ICD-10-CM | POA: Diagnosis not present

## 2017-02-07 DIAGNOSIS — R55 Syncope and collapse: Secondary | ICD-10-CM | POA: Diagnosis not present

## 2017-02-07 DIAGNOSIS — M6281 Muscle weakness (generalized): Secondary | ICD-10-CM | POA: Diagnosis not present

## 2017-02-08 ENCOUNTER — Non-Acute Institutional Stay (SKILLED_NURSING_FACILITY): Payer: Medicare Other | Admitting: Adult Health

## 2017-02-08 ENCOUNTER — Encounter: Payer: Self-pay | Admitting: Adult Health

## 2017-02-08 DIAGNOSIS — E1169 Type 2 diabetes mellitus with other specified complication: Secondary | ICD-10-CM

## 2017-02-08 DIAGNOSIS — I2510 Atherosclerotic heart disease of native coronary artery without angina pectoris: Secondary | ICD-10-CM | POA: Diagnosis not present

## 2017-02-08 DIAGNOSIS — E785 Hyperlipidemia, unspecified: Secondary | ICD-10-CM

## 2017-02-08 DIAGNOSIS — I5032 Chronic diastolic (congestive) heart failure: Secondary | ICD-10-CM

## 2017-02-08 DIAGNOSIS — G4733 Obstructive sleep apnea (adult) (pediatric): Secondary | ICD-10-CM | POA: Diagnosis not present

## 2017-02-08 DIAGNOSIS — E1141 Type 2 diabetes mellitus with diabetic mononeuropathy: Secondary | ICD-10-CM

## 2017-02-08 DIAGNOSIS — R55 Syncope and collapse: Secondary | ICD-10-CM | POA: Diagnosis not present

## 2017-02-08 DIAGNOSIS — N319 Neuromuscular dysfunction of bladder, unspecified: Secondary | ICD-10-CM | POA: Diagnosis not present

## 2017-02-08 DIAGNOSIS — M25512 Pain in left shoulder: Secondary | ICD-10-CM | POA: Diagnosis not present

## 2017-02-08 DIAGNOSIS — E1143 Type 2 diabetes mellitus with diabetic autonomic (poly)neuropathy: Secondary | ICD-10-CM

## 2017-02-08 DIAGNOSIS — I110 Hypertensive heart disease with heart failure: Secondary | ICD-10-CM | POA: Diagnosis not present

## 2017-02-08 DIAGNOSIS — E1165 Type 2 diabetes mellitus with hyperglycemia: Secondary | ICD-10-CM

## 2017-02-08 DIAGNOSIS — L109 Pemphigus, unspecified: Secondary | ICD-10-CM | POA: Diagnosis not present

## 2017-02-08 DIAGNOSIS — M6281 Muscle weakness (generalized): Secondary | ICD-10-CM | POA: Diagnosis not present

## 2017-02-08 DIAGNOSIS — Z9989 Dependence on other enabling machines and devices: Secondary | ICD-10-CM | POA: Diagnosis not present

## 2017-02-08 DIAGNOSIS — IMO0002 Reserved for concepts with insufficient information to code with codable children: Secondary | ICD-10-CM

## 2017-02-08 NOTE — Progress Notes (Addendum)
Location:    [facility] Room Number: 216B Place of Service:  [facility]   CODE STATUS: Full Code/MOST  Allergies  Allergen Reactions  . Ace Inhibitors Swelling    Pt tolerates lisinopril  . Lipitor [Atorvastatin Calcium] Swelling  . Metformin And Related Swelling  . Cefadroxil Hives  . Cephalexin Hives  . Morphine And Related Other (See Comments)    Sweating, feels like is "in rocky boat."  . Robaxin [Methocarbamol] Other (See Comments)    Feels like he is shaky    Chief Complaint  Patient presents with  . Medical Management of Chronic Issues    Routine visit    HPI:  He is a 71 year old long term resident of this facility being seen for the management of his chronic illnesses. He will decline cbg's at times and will also decline insulin dosing. There are no nursing concerns today.   Past Medical History:  Diagnosis Date  . Arthritis    "hands, ankles, knees" (11/10/2014)  . Bladder spasms   . CAD (coronary artery disease)   . CAD in native artery 09/15/2015  . CHF (congestive heart failure) ([place])   . Chronic indwelling Foley catheter   . Chronic lower back pain   . Depression   . Diabetic diarrhea ([place])   . Diabetic neuropathy, painful ([place])   . DVT (deep venous thrombosis) ([facility]) 1980's   LLE  . GERD (gastroesophageal reflux disease)   . Hyperlipidemia   . Hypertension   . Neurogenic bladder   . Obese   . OSA (obstructive sleep apnea)    "they wanted me to wear a mask; I couldn't" (11/10/2014)  . Pneumonia 1999  . PONV (postoperative nausea and vomiting)   . Stroke [facility]) 10/2011; 06/2014   right hand numbness/notes 10/20/2011, pt not sure he had stroke in 2013; "right hand weaker and right face not quite right since" (11/10/2014)  . Type II diabetes mellitus ([place])   . Uncontrolled type II diabetes with peripheral autonomic neuropathy ([place]) 05/09/2015    Past Surgical History:  Procedure Laterality Date  . CARDIAC CATHETERIZATION N/A 05/02/2015   Procedure: Left Heart Cath and Coronary Angiography;  Surgeon: Lorretta Harp, MD;  Location: [place] [facility];  Service: Cardiovascular;  Laterality: N/A;  . CARDIAC CATHETERIZATION N/A 05/04/2015   Procedure: Left Heart Cath and Coronary Angiography;  Surgeon: Wellington Hampshire, MD;  Location: [facility];  Service: Cardiovascular;  Laterality: N/A;  . CARPAL TUNNEL RELEASE Bilateral ~ 2003-2004  . CHOLECYSTECTOMY OPEN  1970's?  Marland Kitchen GASTROPLASTY    . JOINT REPLACEMENT    . LEFT HEART CATHETERIZATION WITH CORONARY ANGIOGRAM N/A 10/24/2011   Procedure: LEFT HEART CATHETERIZATION WITH CORONARY ANGIOGRAM;  Surgeon: Candee Furbish, MD;  Location: [facility] CATH LAB;  Service: Cardiovascular;  Laterality: N/A;  right radial artery approach  . TOTAL KNEE ARTHROPLASTY Right 1990's    Social History   Social History  . Marital status: Widowed    Spouse name: N/A  . Number of children: N/A  . Years of education: N/A   Occupational History  . Retired    Social History Main Topics  . Smoking status: Former Smoker    Packs/day: 2.00    Years: 20.00    Types: Cigarettes    Quit date: 09/10/1976  . Smokeless tobacco: Never Used  . Alcohol use No     Comment: FORMER ALCOHOLIC; "sober since 36/14/4315"  . Drug use: No  . Sexual activity: No  Other Topics Concern  . Not on file   Social History Narrative   Lives alone.  No close family nearby.     Family History  Problem Relation Age of Onset  . Diabetes type I Father   . Hypertension Mother   . Cancer Brother         Testicular Cancer  . Cancer Brother        Leukemia      VITAL SIGNS BP (!) 158/78   Pulse 68   Temp 97.6 F (36.4 C)   Resp 17   Ht 6\' 1"  (1.854 m)   Wt (!) 348 lb 3.2 oz (157.9 kg)   SpO2 97%   BMI 45.94 kg/m   Patient's Medications  New Prescriptions   No medications on file  Previous Medications   ALUMINUM-MAGNESIUM HYDROXIDE-SIMETHICONE (MAALOX) 220-254-27 MG/5ML SUSP    Take 30 mLs by mouth every 6  (six) hours as needed.   AMLODIPINE (NORVASC) 5 MG TABLET    Take 5 mg by mouth daily.   ASPIRIN EC 81 MG TABLET    Take 81 mg by mouth daily. 0900   ESCITALOPRAM (LEXAPRO) 10 MG TABLET    Take 10 mg by mouth daily.    EZETIMIBE (ZETIA) 10 MG TABLET    Take 10 mg by mouth daily.   GABAPENTIN (NEURONTIN) 100 MG CAPSULE    Take 1 capsule (100 mg total) by mouth 2 (two) times daily.   GUAIFENESIN (MUCINEX) 600 MG 12 HR TABLET    Take 600 mg by mouth 2 (two) times daily.   HYDROXYZINE (ATARAX/VISTARIL) 25 MG TABLET    Take 25 mg by mouth 3 (three) times daily.    INSULIN GLARGINE (TOUJEO SOLOSTAR) 300 UNIT/ML SOPN    Inject 35 Units into the skin at bedtime.    INSULIN LISPRO (HUMALOG) 100 UNIT/ML INJECTION    Inject 35 Units into the skin 3 (three) times daily before meals.    IPRATROPIUM-ALBUTEROL (DUONEB) 0.5-2.5 (3) MG/3ML SOLN    Take 3 mLs by nebulization every 6 (six) hours as needed.   METHOTREXATE (RHEUMATREX) 7.5 MG TABLET    Take 7.5 mg by mouth once a week. Caution" Chemotherapy. Protect from light. On Wednesday   METOPROLOL TARTRATE (LOPRESSOR) 25 MG TABLET    12.5 mg. 0.5 tablet by mouth two times a day 0900 , 2100    MULTIPLE VITAMINS-MINERALS (DECUBI-VITE) CAPS    Take 1 capsule by mouth daily at 3 pm.   NITROGLYCERIN (NITROSTAT) 0.4 MG SL TABLET    Place 0.4 mg under the tongue every 5 (five) minutes as needed for chest pain.   NYSTATIN POWD    Apply 1 application transdermally two times a day for rash.  Apply to reddened areas under right side folds   OMEPRAZOLE (PRILOSEC) 20 MG CAPSULE    Take 20 mg by mouth daily.   OXYGEN    Inhale into the lungs. O2 at 2l/min via nasal canula as needed for shortness of breath   POTASSIUM CHLORIDE (KLOR-CON) 20 MEQ PACKET    Take 40 mEq by mouth daily. 0800   PREDNISONE PO    Take by mouth. Tapering Dose: Give 10 mg 1 tablet by mouth daily for rash Give 40 mg by mouth one time daily for rash x 3 weeks beginning 01-10-17 thru 01-31-17 Give 20 mg by  mouth one time a day for rash x 3 weeks beginning 01-31-17 thru 02-21-17   ROPINIROLE (REQUIP) 0.5 MG TABLET  Take 0.5 mg by mouth at bedtime. 2100   SENNOSIDES-DOCUSATE SODIUM (SENOKOT-S) 8.6-50 MG TABLET    Take 2 tablets by mouth at bedtime. 2100   SKIN PROTECTANTS, MISC. (CALAZIME SKIN PROTECTANT EX)    Apply topically. Apply to Sacral area daily as needed for redness   TORSEMIDE (DEMADEX) 20 MG TABLET    Take 20 mg by mouth 2 (two) times daily.   TRAMADOL-ACETAMINOPHEN (ULTRACET) 37.5-325 MG TABLET    Take 1-2 tablets by mouth every 6 (six) hours as needed for moderate pain.   TRAZODONE (DESYREL) 50 MG TABLET    Take 50 mg by mouth at bedtime.   Modified Medications   No medications on file  Discontinued Medications   No medications on file     SIGNIFICANT DIAGNOSTIC EXAMS   10-12-15: chest x-ray: Enlarged cardiac silhouette. Pulmonary vascular congestion.  10-18-15: EEG: This is a normal EEG for the patients stated age.  There were no focal, hemispheric or lateralizing features.  No epileptiform activity was recorded.  A normal EEG does not exclude the diagnosis of a seizure disorder and if seizure remains high on the list of differential diagnosis, an ambulatory EEG may be of value.  Clinical correlation is required.  01-11-16: chest x-ray: Minimal left base atelectasis.  Otherwise, normal exam.  01-11-16: ct of head: No acute intracranial abnormality. Stable mild cerebral atrophy and chronic white matter disease. No definite acute cortical infarction.  01-11-16: scrotal ultrasound: 1. Left epididymo-orchitis. 2. Microlithiasis involving the left testicle. 3. Three adjacent complex cysts/spermatoceles involving the left epididymal head.   LABS REVIEWED:     01-15-16: wbc 7.4; hgb 11.6; hct 35.7; mcv 89.9; plt 242; glucose 159; bun 5; creat 0.53; k+ 4.1; na++135  05-27-16: hgb a1c 11.7 07-20-16: stool for c-diff: +  07-30-16: wbc 15.1; hgb 12.4; hct 37.7; mcv 93.5; plt 289; glucose 322;  bun 28.3; creat 0.91; k+ 4.3; na++ 137; liver normal albumin 3.4  12-13-16: urine micro-albumin 54.44 01-29-17: wbc 15.0; hgb 12.6; hct 38.1; mcv 93.0; plt 255; glucose 418; bun 29.1 creat 1.03; k+ 4.1; n++ 136; ca 8.6; liver normal albumin 3.3; hgb a1c 9.7   Review of Systems  Constitutional: Negative for malaise/fatigue.  Respiratory: Negative for cough and shortness of breath.   Cardiovascular: negative for leg swelling . Negative for chest pain and palpitations.  Gastrointestinal: Negative for heartburn, abdominal pain and constipation.  Musculoskeletal:no complaints of pain  Skin: no complaints rash on legs and arms is better  Neurological: Negative for dizziness.  Psychiatric/Behavioral: The patient is not nervous/anxious.        Physical Exam  Constitutional: He is oriented to person, place, and time. No distress.  Obese   Eyes: Conjunctivae are normal.  Neck: Neck supple. No JVD present. No thyromegaly present.  Cardiovascular: Normal rate, regular rhythm and intact distal pulses.   Respiratory: Effort normal and breath sounds normal. No respiratory distress. He has no wheezes.  GI: Soft. Bowel sounds are normal. He exhibits no distension. There is no tenderness.  Genitourinary:  Has foley has penile tear    Musculoskeletal: He exhibits edema.  Able to move all extremities Trace bilateral lower extremity edema     Lymphadenopathy:    He has no cervical adenopathy.  Neurological: alert  Skin: Skin is warm and dry. He is not diaphoretic.  His pemphigoid rash is improving on his legs and arms.     ASSESSMENT/ PLAN:  1. bullous pemphigoid:  He has declined to see dermatologist  any further; will continue  atarax 25 mg three times daily   Will increase methotrexate to 10 mg weekly. Will continue  Prednisone taper will monitor   2. Hypertension: b/p 155/78 will continue norvasc 5 mg daily  Lopressor 12.5 mg twice daily asa 81 mg daily   3. Gerd: will continue  prilosec 40  mg daily   4.  Diastolic heart failure:  2-d done 05-28-16 (no EF) : will continue demadex 20 mg twice daily with k+ 40 meq daily  Will monitor  5. Diabetes: hgb a1c 9.7 (previous  11.7); will continue  humalog 35  units after meals and tuojeo 35 units nightly . At this time will not make changes in his insulin as he does not want any changes.   6.  OSA: is on cpap;   7. Diabetic neuropathy: will continue neurontin 100 mg twice daily has ultracet 1 or 2 tabs every 6 hours as needed  8. Depression will continue lexapro 10 mg daily has trazodone 50 mg nightly    9.  History of CVA: is neurologically stable; will continue asa 81 mg daily   10.  Allergic rhinitis: will continue mucinex 600 mg twice daily duoneb four times daily as needed   11. RLS: will continue requip 0.5 mg nightly   12. CAD: no complaint of chest pain: will continue asa 81 mg daily and prn ntg  13. Constipation: will continue senna s 2 tabs nightly   13. Neurogenic bladder: has chronic foley   14. Dyslipidemia: will continue zetia 10 mg daily      MD is aware of resident's narcotic use and is in agreement with current plan of care. We will attempt to wean resident as apropriate   Ok Edwards NP [facility]  Contact [phone] Monday through Friday 8am- 5pm  After hours call [phone]

## 2017-02-11 DIAGNOSIS — R55 Syncope and collapse: Secondary | ICD-10-CM | POA: Diagnosis not present

## 2017-02-11 DIAGNOSIS — M25512 Pain in left shoulder: Secondary | ICD-10-CM | POA: Diagnosis not present

## 2017-02-11 DIAGNOSIS — M6281 Muscle weakness (generalized): Secondary | ICD-10-CM | POA: Diagnosis not present

## 2017-02-12 DIAGNOSIS — E1169 Type 2 diabetes mellitus with other specified complication: Secondary | ICD-10-CM | POA: Diagnosis not present

## 2017-02-12 DIAGNOSIS — M25512 Pain in left shoulder: Secondary | ICD-10-CM | POA: Diagnosis not present

## 2017-02-12 DIAGNOSIS — D649 Anemia, unspecified: Secondary | ICD-10-CM | POA: Diagnosis not present

## 2017-02-12 DIAGNOSIS — E08628 Diabetes mellitus due to underlying condition with other skin complications: Secondary | ICD-10-CM | POA: Diagnosis not present

## 2017-02-12 DIAGNOSIS — E08311 Diabetes mellitus due to underlying condition with unspecified diabetic retinopathy with macular edema: Secondary | ICD-10-CM | POA: Diagnosis not present

## 2017-02-12 DIAGNOSIS — R55 Syncope and collapse: Secondary | ICD-10-CM | POA: Diagnosis not present

## 2017-02-12 DIAGNOSIS — M6281 Muscle weakness (generalized): Secondary | ICD-10-CM | POA: Diagnosis not present

## 2017-02-12 LAB — BASIC METABOLIC PANEL
BUN: 23 — AB (ref 4–21)
Creatinine: 1.1 (ref 0.6–1.3)
Glucose: 206
POTASSIUM: 3.3 — AB (ref 3.4–5.3)
SODIUM: 138 (ref 137–147)

## 2017-02-13 ENCOUNTER — Encounter: Payer: Self-pay | Admitting: Adult Health

## 2017-02-13 ENCOUNTER — Non-Acute Institutional Stay (SKILLED_NURSING_FACILITY): Payer: Medicare Other | Admitting: Adult Health

## 2017-02-13 DIAGNOSIS — E785 Hyperlipidemia, unspecified: Secondary | ICD-10-CM | POA: Diagnosis not present

## 2017-02-13 DIAGNOSIS — M6281 Muscle weakness (generalized): Secondary | ICD-10-CM | POA: Diagnosis not present

## 2017-02-13 DIAGNOSIS — E1165 Type 2 diabetes mellitus with hyperglycemia: Secondary | ICD-10-CM | POA: Diagnosis not present

## 2017-02-13 DIAGNOSIS — M25512 Pain in left shoulder: Secondary | ICD-10-CM | POA: Diagnosis not present

## 2017-02-13 DIAGNOSIS — R55 Syncope and collapse: Secondary | ICD-10-CM | POA: Diagnosis not present

## 2017-02-13 DIAGNOSIS — E1169 Type 2 diabetes mellitus with other specified complication: Secondary | ICD-10-CM

## 2017-02-13 DIAGNOSIS — E1143 Type 2 diabetes mellitus with diabetic autonomic (poly)neuropathy: Secondary | ICD-10-CM | POA: Diagnosis not present

## 2017-02-13 DIAGNOSIS — IMO0002 Reserved for concepts with insufficient information to code with codable children: Secondary | ICD-10-CM

## 2017-02-13 NOTE — Progress Notes (Signed)
Location:   [place] Room Number: 216 B Place of Service:  SNF (31)   CODE STATUS: Full Code  Allergies  Allergen Reactions  . Ace Inhibitors Swelling    Pt tolerates lisinopril  . Lipitor [Atorvastatin Calcium] Swelling  . Metformin And Related Swelling  . Cefadroxil Hives  . Cephalexin Hives  . Morphine And Related Other (See Comments)    Sweating, feels like is "in rocky boat."  . Robaxin [Methocarbamol] Other (See Comments)    Feels like he is shaky    Chief Complaint  Patient presents with  . Acute Visit    Diabetes Mellitus    HPI:  He has been declining his cbg's. He tells me that he does not like the lancet device as it is too painful to his fingers. He is wanting a different device before he will allow the staff to monitor his cbg's. He is also declining insulin at times. I have met with the  DON and Administrator regarding this situation. The facility will provide him with a different device.    Past Medical History:  Diagnosis Date  . Arthritis    "hands, ankles, knees" (11/10/2014)  . Bladder spasms   . CAD (coronary artery disease)   . CAD in native artery 09/15/2015  . CHF (congestive heart failure) ([place])   . Chronic indwelling Foley catheter   . Chronic lower back pain   . Depression   . Diabetic diarrhea ([place])   . Diabetic neuropathy, painful ([place])   . DVT (deep venous thrombosis) ([facility]) 1980's   LLE  . GERD (gastroesophageal reflux disease)   . Hyperlipidemia   . Hypertension   . Neurogenic bladder   . Obese   . OSA (obstructive sleep apnea)    "they wanted me to wear a mask; I couldn't" (11/10/2014)  . Pneumonia 1999  . PONV (postoperative nausea and vomiting)   . Stroke [facility]) 10/2011; 06/2014   right hand numbness/notes 10/20/2011, pt not sure he had stroke in 2013; "right hand weaker and right face not quite right since" (11/10/2014)  . Type II diabetes mellitus ([place])   . Uncontrolled type II diabetes with peripheral autonomic  neuropathy ([place]) 05/09/2015    Past Surgical History:  Procedure Laterality Date  . CARDIAC CATHETERIZATION N/A 05/02/2015   Procedure: Left Heart Cath and Coronary Angiography;  Surgeon: Lorretta Harp, MD;  Location: [place] [facility];  Service: Cardiovascular;  Laterality: N/A;  . CARDIAC CATHETERIZATION N/A 05/04/2015   Procedure: Left Heart Cath and Coronary Angiography;  Surgeon: Wellington Hampshire, MD;  Location: [place] [facility];  Service: Cardiovascular;  Laterality: N/A;  . CARPAL TUNNEL RELEASE Bilateral ~ 2003-2004  . CHOLECYSTECTOMY OPEN  1970's?  Marland Kitchen GASTROPLASTY    . JOINT REPLACEMENT    . LEFT HEART CATHETERIZATION WITH CORONARY ANGIOGRAM N/A 10/24/2011   Procedure: LEFT HEART CATHETERIZATION WITH CORONARY ANGIOGRAM;  Surgeon: Candee Furbish, MD;  Location: [facility] CATH LAB;  Service: Cardiovascular;  Laterality: N/A;  right radial artery approach  . TOTAL KNEE ARTHROPLASTY Right 1990's    Social History   Social History  . Marital status: Widowed    Spouse name: N/A  . Number of children: N/A  . Years of education: N/A   Occupational History  . Retired    Social History Main Topics  . Smoking status: Former Smoker    Packs/day: 2.00    Years: 20.00    Types: Cigarettes    Quit date: 09/10/1976  .  Smokeless tobacco: Never Used  . Alcohol use No     Comment: FORMER ALCOHOLIC; "sober since 67/61/9509"  . Drug use: No  . Sexual activity: No   Other Topics Concern  . Not on file   Social History Narrative   Lives alone.  No close family nearby.     Family History  Problem Relation Age of Onset  . Diabetes type I Father   . Hypertension Mother   . Cancer Brother         Testicular Cancer  . Cancer Brother        Leukemia      VITAL SIGNS BP 125/60   Pulse 76   Temp (!) 96.9 F (36.1 C)   Resp 13   Ht 6' (1.829 m)   Wt (!) 348 lb 3 oz (157.9 kg)   SpO2 95%   BMI 47.22 kg/m   Patient's Medications  New Prescriptions   No medications on file    Previous Medications   ALUMINUM-MAGNESIUM HYDROXIDE-SIMETHICONE (MAALOX) 326-712-45 MG/5ML SUSP    Take 30 mLs by mouth every 6 (six) hours as needed.   AMLODIPINE (NORVASC) 5 MG TABLET    Take 5 mg by mouth daily.   ASPIRIN EC 81 MG TABLET    Take 81 mg by mouth daily. 0900   ESCITALOPRAM (LEXAPRO) 10 MG TABLET    Take 10 mg by mouth daily.    EZETIMIBE (ZETIA) 10 MG TABLET    Take 10 mg by mouth daily.   FOLIC ACID (FOLVITE) 1 MG TABLET    Take 1 mg by mouth daily.   GABAPENTIN (NEURONTIN) 100 MG CAPSULE    Take 1 capsule (100 mg total) by mouth 2 (two) times daily.   GUAIFENESIN (MUCINEX) 600 MG 12 HR TABLET    Take 600 mg by mouth 2 (two) times daily.   HYDROXYZINE (ATARAX/VISTARIL) 25 MG TABLET    Take 25 mg by mouth 3 (three) times daily.    INSULIN GLARGINE (TOUJEO SOLOSTAR) 300 UNIT/ML SOPN    Inject 35 Units into the skin at bedtime.    INSULIN LISPRO (HUMALOG) 100 UNIT/ML INJECTION    Inject 35 Units into the skin 3 (three) times daily before meals.    IPRATROPIUM-ALBUTEROL (DUONEB) 0.5-2.5 (3) MG/3ML SOLN    Take 3 mLs by nebulization every 6 (six) hours as needed.   METHOTREXATE (RHEUMATREX) 10 MG TABLET    Take 10 mg by mouth every Wednesday. Caution: Chemotherapy. Protect from light.   METOPROLOL TARTRATE (LOPRESSOR) 25 MG TABLET    Take 12.5 mg by mouth 2 (two) times daily.    MULTIPLE VITAMINS-MINERALS (DECUBI-VITE) CAPS    Take 1 capsule by mouth daily at 3 pm.   NITROGLYCERIN (NITROSTAT) 0.4 MG SL TABLET    Place 0.4 mg under the tongue every 5 (five) minutes as needed for chest pain.   NYSTATIN POWD    Apply 1 application transdermally two times a day for rash.  Apply to reddened areas under right side folds   OMEPRAZOLE (PRILOSEC) 20 MG CAPSULE    Take 20 mg by mouth daily.   OXYGEN    Inhale into the lungs. O2 at 2l/min via nasal canula as needed for shortness of breath   POTASSIUM CHLORIDE (KLOR-CON) 20 MEQ PACKET    Take 40 mEq by mouth daily. 0800   PREDNISONE PO     Take by mouth. Tapering Dose: Give 10 mg 1 tablet by mouth daily for rash Give 40  mg by mouth one time daily for rash x 3 weeks beginning 01-10-17 thru 01-31-17 Give 20 mg by mouth one time a day for rash x 3 weeks beginning 01-31-17 thru 02-21-17   ROPINIROLE (REQUIP) 0.5 MG TABLET    Take 0.5 mg by mouth at bedtime. 2100   SENNOSIDES-DOCUSATE SODIUM (SENOKOT-S) 8.6-50 MG TABLET    Take 2 tablets by mouth at bedtime. 2100   SKIN PROTECTANTS, MISC. (CALAZIME SKIN PROTECTANT EX)    Apply topically. Apply to Sacral area daily as needed for redness   TORSEMIDE (DEMADEX) 20 MG TABLET    Take 20 mg by mouth 2 (two) times daily.   TRAMADOL-ACETAMINOPHEN (ULTRACET) 37.5-325 MG TABLET    Take 1-2 tablets by mouth every 6 (six) hours as needed for moderate pain.   TRAZODONE (DESYREL) 50 MG TABLET    Take 50 mg by mouth at bedtime.   Modified Medications   No medications on file  Discontinued Medications   No medications on file     SIGNIFICANT DIAGNOSTIC EXAMS  1. Diabetes: hgb a1c 9.7 (previous  11.7); will continue  humalog 35  units after meals and tuojeo 35 units nightly . At this time will not make changes in his insulin as he does not want any changes.  Will have him use a different device to monitor his cbg's   2. Dyslipidemia: will continue zetia 10 mg daily   Time spent with patient 45   minutes >50% time spent counseling; reviewing medical record; tests; labs; and developing future plan of care   MD is aware of resident's narcotic use and is in agreement with current plan of care. We will attempt to wean resident as apropriate   Ok Edwards NP [facility]  Contact [phone] Monday through Friday 8am- 5pm  After hours call [phone]

## 2017-02-14 DIAGNOSIS — M6281 Muscle weakness (generalized): Secondary | ICD-10-CM | POA: Diagnosis not present

## 2017-02-14 DIAGNOSIS — M25512 Pain in left shoulder: Secondary | ICD-10-CM | POA: Diagnosis not present

## 2017-02-14 DIAGNOSIS — R55 Syncope and collapse: Secondary | ICD-10-CM | POA: Diagnosis not present

## 2017-02-15 DIAGNOSIS — M6281 Muscle weakness (generalized): Secondary | ICD-10-CM | POA: Diagnosis not present

## 2017-02-15 DIAGNOSIS — M25512 Pain in left shoulder: Secondary | ICD-10-CM | POA: Diagnosis not present

## 2017-02-15 DIAGNOSIS — R55 Syncope and collapse: Secondary | ICD-10-CM | POA: Diagnosis not present

## 2017-02-18 DIAGNOSIS — M25512 Pain in left shoulder: Secondary | ICD-10-CM | POA: Diagnosis not present

## 2017-02-18 DIAGNOSIS — R55 Syncope and collapse: Secondary | ICD-10-CM | POA: Diagnosis not present

## 2017-02-18 DIAGNOSIS — M6281 Muscle weakness (generalized): Secondary | ICD-10-CM | POA: Diagnosis not present

## 2017-02-19 DIAGNOSIS — L98419 Non-pressure chronic ulcer of buttock with unspecified severity: Secondary | ICD-10-CM | POA: Diagnosis not present

## 2017-02-19 DIAGNOSIS — R55 Syncope and collapse: Secondary | ICD-10-CM | POA: Diagnosis not present

## 2017-02-19 DIAGNOSIS — M6281 Muscle weakness (generalized): Secondary | ICD-10-CM | POA: Diagnosis not present

## 2017-02-19 DIAGNOSIS — M25512 Pain in left shoulder: Secondary | ICD-10-CM | POA: Diagnosis not present

## 2017-02-20 DIAGNOSIS — M6281 Muscle weakness (generalized): Secondary | ICD-10-CM | POA: Diagnosis not present

## 2017-02-20 DIAGNOSIS — M25512 Pain in left shoulder: Secondary | ICD-10-CM | POA: Diagnosis not present

## 2017-02-20 DIAGNOSIS — R55 Syncope and collapse: Secondary | ICD-10-CM | POA: Diagnosis not present

## 2017-02-21 DIAGNOSIS — M25512 Pain in left shoulder: Secondary | ICD-10-CM | POA: Diagnosis not present

## 2017-02-21 DIAGNOSIS — R55 Syncope and collapse: Secondary | ICD-10-CM | POA: Diagnosis not present

## 2017-02-21 DIAGNOSIS — M6281 Muscle weakness (generalized): Secondary | ICD-10-CM | POA: Diagnosis not present

## 2017-02-22 DIAGNOSIS — M25512 Pain in left shoulder: Secondary | ICD-10-CM | POA: Diagnosis not present

## 2017-02-22 DIAGNOSIS — M6281 Muscle weakness (generalized): Secondary | ICD-10-CM | POA: Diagnosis not present

## 2017-02-22 DIAGNOSIS — R55 Syncope and collapse: Secondary | ICD-10-CM | POA: Diagnosis not present

## 2017-02-25 DIAGNOSIS — M25512 Pain in left shoulder: Secondary | ICD-10-CM | POA: Diagnosis not present

## 2017-02-25 DIAGNOSIS — R55 Syncope and collapse: Secondary | ICD-10-CM | POA: Diagnosis not present

## 2017-02-25 DIAGNOSIS — M6281 Muscle weakness (generalized): Secondary | ICD-10-CM | POA: Diagnosis not present

## 2017-02-26 DIAGNOSIS — M6281 Muscle weakness (generalized): Secondary | ICD-10-CM | POA: Diagnosis not present

## 2017-02-26 DIAGNOSIS — L98419 Non-pressure chronic ulcer of buttock with unspecified severity: Secondary | ICD-10-CM | POA: Diagnosis not present

## 2017-02-26 DIAGNOSIS — R55 Syncope and collapse: Secondary | ICD-10-CM | POA: Diagnosis not present

## 2017-02-26 DIAGNOSIS — M25512 Pain in left shoulder: Secondary | ICD-10-CM | POA: Diagnosis not present

## 2017-02-27 DIAGNOSIS — M25512 Pain in left shoulder: Secondary | ICD-10-CM | POA: Diagnosis not present

## 2017-02-27 DIAGNOSIS — R55 Syncope and collapse: Secondary | ICD-10-CM | POA: Diagnosis not present

## 2017-02-27 DIAGNOSIS — M6281 Muscle weakness (generalized): Secondary | ICD-10-CM | POA: Diagnosis not present

## 2017-02-28 DIAGNOSIS — R55 Syncope and collapse: Secondary | ICD-10-CM | POA: Diagnosis not present

## 2017-02-28 DIAGNOSIS — M6281 Muscle weakness (generalized): Secondary | ICD-10-CM | POA: Diagnosis not present

## 2017-02-28 DIAGNOSIS — M25512 Pain in left shoulder: Secondary | ICD-10-CM | POA: Diagnosis not present

## 2017-03-01 DIAGNOSIS — R55 Syncope and collapse: Secondary | ICD-10-CM | POA: Diagnosis not present

## 2017-03-01 DIAGNOSIS — M25512 Pain in left shoulder: Secondary | ICD-10-CM | POA: Diagnosis not present

## 2017-03-01 DIAGNOSIS — M6281 Muscle weakness (generalized): Secondary | ICD-10-CM | POA: Diagnosis not present

## 2017-03-04 DIAGNOSIS — M25512 Pain in left shoulder: Secondary | ICD-10-CM | POA: Diagnosis not present

## 2017-03-04 DIAGNOSIS — M6281 Muscle weakness (generalized): Secondary | ICD-10-CM | POA: Diagnosis not present

## 2017-03-04 DIAGNOSIS — R55 Syncope and collapse: Secondary | ICD-10-CM | POA: Diagnosis not present

## 2017-03-05 ENCOUNTER — Emergency Department (HOSPITAL_COMMUNITY)
Admission: EM | Admit: 2017-03-05 | Discharge: 2017-03-05 | Disposition: A | Discharge status: Home / Self Care | Payer: Medicare Other | Attending: Emergency Medicine | Admitting: Emergency Medicine

## 2017-03-05 ENCOUNTER — Encounter (HOSPITAL_COMMUNITY): Payer: Self-pay | Admitting: Emergency Medicine

## 2017-03-05 DIAGNOSIS — M6281 Muscle weakness (generalized): Secondary | ICD-10-CM | POA: Diagnosis not present

## 2017-03-05 DIAGNOSIS — Y929 Unspecified place or not applicable: Secondary | ICD-10-CM | POA: Insufficient documentation

## 2017-03-05 DIAGNOSIS — M25512 Pain in left shoulder: Secondary | ICD-10-CM | POA: Diagnosis not present

## 2017-03-05 DIAGNOSIS — Z96651 Presence of right artificial knee joint: Secondary | ICD-10-CM | POA: Insufficient documentation

## 2017-03-05 DIAGNOSIS — Y732 Prosthetic and other implants, materials and accessory gastroenterology and urology devices associated with adverse incidents: Secondary | ICD-10-CM | POA: Insufficient documentation

## 2017-03-05 DIAGNOSIS — Y939 Activity, unspecified: Secondary | ICD-10-CM | POA: Insufficient documentation

## 2017-03-05 DIAGNOSIS — N329 Bladder disorder, unspecified: Secondary | ICD-10-CM | POA: Diagnosis not present

## 2017-03-05 DIAGNOSIS — I110 Hypertensive heart disease with heart failure: Secondary | ICD-10-CM | POA: Insufficient documentation

## 2017-03-05 DIAGNOSIS — Z7982 Long term (current) use of aspirin: Secondary | ICD-10-CM | POA: Insufficient documentation

## 2017-03-05 DIAGNOSIS — Y999 Unspecified external cause status: Secondary | ICD-10-CM | POA: Diagnosis not present

## 2017-03-05 DIAGNOSIS — T839XXA Unspecified complication of genitourinary prosthetic device, implant and graft, initial encounter: Secondary | ICD-10-CM | POA: Diagnosis not present

## 2017-03-05 DIAGNOSIS — Z87891 Personal history of nicotine dependence: Secondary | ICD-10-CM | POA: Diagnosis not present

## 2017-03-05 DIAGNOSIS — Z79899 Other long term (current) drug therapy: Secondary | ICD-10-CM | POA: Diagnosis not present

## 2017-03-05 DIAGNOSIS — R55 Syncope and collapse: Secondary | ICD-10-CM | POA: Diagnosis not present

## 2017-03-05 DIAGNOSIS — T83198A Other mechanical complication of other urinary devices and implants, initial encounter: Secondary | ICD-10-CM | POA: Diagnosis not present

## 2017-03-05 DIAGNOSIS — I5032 Chronic diastolic (congestive) heart failure: Secondary | ICD-10-CM | POA: Insufficient documentation

## 2017-03-05 DIAGNOSIS — E1140 Type 2 diabetes mellitus with diabetic neuropathy, unspecified: Secondary | ICD-10-CM | POA: Diagnosis not present

## 2017-03-05 DIAGNOSIS — T83091A Other mechanical complication of indwelling urethral catheter, initial encounter: Secondary | ICD-10-CM | POA: Diagnosis not present

## 2017-03-05 DIAGNOSIS — R339 Retention of urine, unspecified: Secondary | ICD-10-CM

## 2017-03-05 DIAGNOSIS — Z794 Long term (current) use of insulin: Secondary | ICD-10-CM | POA: Insufficient documentation

## 2017-03-05 DIAGNOSIS — T83498A Other mechanical complication of other prosthetic devices, implants and grafts of genital tract, initial encounter: Secondary | ICD-10-CM | POA: Diagnosis not present

## 2017-03-05 DIAGNOSIS — L98419 Non-pressure chronic ulcer of buttock with unspecified severity: Secondary | ICD-10-CM | POA: Diagnosis not present

## 2017-03-05 LAB — URINALYSIS, ROUTINE W REFLEX MICROSCOPIC
Bilirubin Urine: NEGATIVE
Glucose, UA: >=500 mg/dL — AB
KETONES UR: NEGATIVE mg/dL
NITRITE: NEGATIVE
PH: 6 (ref 5.0–8.0)
PROTEIN: 100 mg/dL — AB
Specific Gravity, Urine: 1.009 (ref 1.005–1.030)

## 2017-03-05 NOTE — ED Notes (Signed)
Junie Panning SW was at bedside to speak to pt, she reports pt is willing to return to [facility] to speak to his [place] contacted [facility] for transport

## 2017-03-05 NOTE — ED Notes (Signed)
Pt resting, waiting for SW consult.

## 2017-03-05 NOTE — Progress Notes (Signed)
[name] aware of consult. Will speak with patient asap.   Kingsley Spittle, LCSWA Clinical Social Worker [phone]

## 2017-03-05 NOTE — ED Triage Notes (Signed)
Patient is having issues with catheter. Patient states that he called nurse at [facility] to come check catheter due to him having pain. Nurse did not do anything. Patient called EMS himself.

## 2017-03-05 NOTE — ED Notes (Signed)
Bed: WA20 Expected date:  Expected time:  Means of arrival:  Comments: 71 yo M  Catheter problems

## 2017-03-05 NOTE — ED Provider Notes (Signed)
8:51 AM Care assumed from Dr. Betsey Holiday. At time of transfer of care, patient is awaiting social work disposition after patient refused transport back to his facility once he was medically cleared and his Foley catheter was exchanged. Urine culture was sent to determine if patient has an active UTI. Anticipate recommendations by social work.  Patient became agreeable to discharge home. Patient discharged back to facility.   Tegeler, Gwenyth Allegra, MD 03/05/17 [phone]

## 2017-03-05 NOTE — ED Notes (Addendum)
Called facility to give report an no one [phone]
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
## 2017-03-05 NOTE — Progress Notes (Signed)
[name] spoke with patient regarding returning to [facility]. Patient agreeable at this time-representative at facility will speak with patient regarding concerns and needs. [facility] at [facility] and able to take patient.   Kingsley Spittle, LCSWA Clinical Social Worker [phone]

## 2017-03-06 DIAGNOSIS — M6281 Muscle weakness (generalized): Secondary | ICD-10-CM | POA: Diagnosis not present

## 2017-03-06 DIAGNOSIS — M25512 Pain in left shoulder: Secondary | ICD-10-CM | POA: Diagnosis not present

## 2017-03-06 DIAGNOSIS — R55 Syncope and collapse: Secondary | ICD-10-CM | POA: Diagnosis not present

## 2017-03-07 DIAGNOSIS — M25512 Pain in left shoulder: Secondary | ICD-10-CM | POA: Diagnosis not present

## 2017-03-07 DIAGNOSIS — R55 Syncope and collapse: Secondary | ICD-10-CM | POA: Diagnosis not present

## 2017-03-07 DIAGNOSIS — M6281 Muscle weakness (generalized): Secondary | ICD-10-CM | POA: Diagnosis not present

## 2017-03-08 DIAGNOSIS — M6281 Muscle weakness (generalized): Secondary | ICD-10-CM | POA: Diagnosis not present

## 2017-03-08 DIAGNOSIS — R55 Syncope and collapse: Secondary | ICD-10-CM | POA: Diagnosis not present

## 2017-03-08 DIAGNOSIS — M25512 Pain in left shoulder: Secondary | ICD-10-CM | POA: Diagnosis not present

## 2017-03-08 LAB — URINE CULTURE

## 2017-03-09 ENCOUNTER — Telehealth: Payer: Self-pay

## 2017-03-09 NOTE — Telephone Encounter (Signed)
No treatment for UC 03/05/17 Per Salome Arnt Pharm D

## 2017-03-11 ENCOUNTER — Encounter: Payer: Self-pay | Admitting: Adult Health

## 2017-03-11 ENCOUNTER — Non-Acute Institutional Stay (SKILLED_NURSING_FACILITY): Payer: Medicare Other | Admitting: Adult Health

## 2017-03-11 DIAGNOSIS — E1143 Type 2 diabetes mellitus with diabetic autonomic (poly)neuropathy: Secondary | ICD-10-CM | POA: Diagnosis not present

## 2017-03-11 DIAGNOSIS — E785 Hyperlipidemia, unspecified: Secondary | ICD-10-CM

## 2017-03-11 DIAGNOSIS — E1141 Type 2 diabetes mellitus with diabetic mononeuropathy: Secondary | ICD-10-CM | POA: Diagnosis not present

## 2017-03-11 DIAGNOSIS — L109 Pemphigus, unspecified: Secondary | ICD-10-CM

## 2017-03-11 DIAGNOSIS — E1169 Type 2 diabetes mellitus with other specified complication: Secondary | ICD-10-CM | POA: Diagnosis not present

## 2017-03-11 DIAGNOSIS — E1165 Type 2 diabetes mellitus with hyperglycemia: Secondary | ICD-10-CM

## 2017-03-11 DIAGNOSIS — N319 Neuromuscular dysfunction of bladder, unspecified: Secondary | ICD-10-CM

## 2017-03-11 DIAGNOSIS — I5032 Chronic diastolic (congestive) heart failure: Secondary | ICD-10-CM

## 2017-03-11 DIAGNOSIS — R55 Syncope and collapse: Secondary | ICD-10-CM | POA: Diagnosis not present

## 2017-03-11 DIAGNOSIS — I110 Hypertensive heart disease with heart failure: Secondary | ICD-10-CM

## 2017-03-11 DIAGNOSIS — M25512 Pain in left shoulder: Secondary | ICD-10-CM | POA: Diagnosis not present

## 2017-03-11 DIAGNOSIS — M6281 Muscle weakness (generalized): Secondary | ICD-10-CM | POA: Diagnosis not present

## 2017-03-11 DIAGNOSIS — IMO0002 Reserved for concepts with insufficient information to code with codable children: Secondary | ICD-10-CM

## 2017-03-11 NOTE — Progress Notes (Signed)
Location:   [facility] Room Number: 216 B Place of Service:  SNF (31)   CODE STATUS: Full Code  Allergies  Allergen Reactions  . Ace Inhibitors Swelling    Pt tolerates lisinopril  . Lipitor [Atorvastatin Calcium] Swelling  . Metformin And Related Swelling  . Cefadroxil Hives  . Cephalexin Hives  . Morphine And Related Other (See Comments)    Sweating, feels like is "in rocky boat."  . Robaxin [Methocarbamol] Other (See Comments)    Feels like he is shaky    Chief Complaint  Patient presents with  . Medical Management of Chronic Issues    1 month follow up    HPI:  He is a 71 year old resident of this facility being seen for the management of his chronic illnesses. He tells me that he likes the lancet device to monitor his cbg's. He says that he is feeling good. His  cbg's are elevated. There are no nursing concerns at this time.    Past Medical History:  Diagnosis Date  . Arthritis    "hands, ankles, knees" (11/10/2014)  . Bladder spasms   . CAD (coronary artery disease)   . CAD in native artery 09/15/2015  . CHF (congestive heart failure) ([place])   . Chronic indwelling Foley catheter   . Chronic lower back pain   . Depression   . Diabetic diarrhea ([place])   . Diabetic neuropathy, painful ([place])   . DVT (deep venous thrombosis) (H[facility]) 1980's   LLE  . GERD (gastroesophageal reflux disease)   . Hyperlipidemia   . Hypertension   . Neurogenic bladder   . Obese   . OSA (obstructive sleep apnea)    "they wanted me to wear a mask; I couldn't" (11/10/2014)  . Pneumonia 1999  . PONV (postoperative nausea and vomiting)   . Stroke [facility]) 10/2011; 06/2014   right hand numbness/notes 10/20/2011, pt not sure he had stroke in 2013; "right hand weaker and right face not quite right since" (11/10/2014)  . Type II diabetes mellitus ([place])   . Uncontrolled type II diabetes with peripheral autonomic neuropathy ([place]) 05/09/2015    Past Surgical History:  Procedure Laterality Date   . CARDIAC CATHETERIZATION N/A 05/02/2015   Procedure: Left Heart Cath and Coronary Angiography;  Surgeon: Lorretta Harp, MD;  Location: [place] CV LAB;  Service: Cardiovascular;  Laterality: N/A;  . CARDIAC CATHETERIZATION N/A 05/04/2015   Procedure: Left Heart Cath and Coronary Angiography;  Surgeon: Wellington Hampshire, MD;  Location: [place] CV LAB;  Service: Cardiovascular;  Laterality: N/A;  . CARPAL TUNNEL RELEASE Bilateral ~ 2003-2004  . CHOLECYSTECTOMY OPEN  1970's?  Marland Kitchen GASTROPLASTY    . JOINT REPLACEMENT    . LEFT HEART CATHETERIZATION WITH CORONARY ANGIOGRAM N/A 10/24/2011   Procedure: LEFT HEART CATHETERIZATION WITH CORONARY ANGIOGRAM;  Surgeon: Candee Furbish, MD;  Location: [facility] CATH LAB;  Service: Cardiovascular;  Laterality: N/A;  right radial artery approach  . TOTAL KNEE ARTHROPLASTY Right 1990's    Social History   Social History  . Marital status: Widowed    Spouse name: N/A  . Number of children: N/A  . Years of education: N/A   Occupational History  . Retired    Social History Main Topics  . Smoking status: Former Smoker    Packs/day: 2.00    Years: 20.00    Types: Cigarettes    Quit date: 09/10/1976  . Smokeless tobacco: Never Used  . Alcohol use No  Comment: FORMER ALCOHOLIC; "sober since 95/63/8756"  . Drug use: No  . Sexual activity: No   Other Topics Concern  . Not on file   Social History Narrative   Lives alone.  No close family nearby.     Family History  Problem Relation Age of Onset  . Diabetes type I Father   . Hypertension Mother   . Cancer Brother         Testicular Cancer  . Cancer Brother        Leukemia      VITAL SIGNS BP 140/80   Pulse 73   Temp 98.1 F (36.7 C)   Ht 6\' 1"  (1.854 m)   Wt (!) 348 lb 3.2 oz (157.9 kg)   SpO2 98%   BMI 45.94 kg/m   Patient's Medications  New Prescriptions   No medications on file  Previous Medications   ALUMINUM-MAGNESIUM HYDROXIDE-SIMETHICONE (MAALOX) 433-295-18 MG/5ML SUSP     Take 30 mLs by mouth every 6 (six) hours as needed.   AMLODIPINE (NORVASC) 5 MG TABLET    Take 5 mg by mouth daily.   ASPIRIN EC 81 MG TABLET    Take 81 mg by mouth daily. 0900   CLOTRIMAZOLE 1 % OINT    Apply to trunk topically every day and night shift for itching   ESCITALOPRAM (LEXAPRO) 10 MG TABLET    Take 10 mg by mouth daily.    EZETIMIBE (ZETIA) 10 MG TABLET    Take 10 mg by mouth daily.   FOLIC ACID (FOLVITE) 1 MG TABLET    Take 1 mg by mouth daily.   GABAPENTIN (NEURONTIN) 100 MG CAPSULE    Take 1 capsule (100 mg total) by mouth 2 (two) times daily.   GUAIFENESIN (MUCINEX) 600 MG 12 HR TABLET    Take 600 mg by mouth 2 (two) times daily.   HYDROXYZINE (ATARAX/VISTARIL) 25 MG TABLET    Take 25 mg by mouth 3 (three) times daily.    INSULIN GLARGINE (TOUJEO SOLOSTAR) 300 UNIT/ML SOPN    Inject 35 Units into the skin at bedtime.    INSULIN LISPRO (HUMALOG) 100 UNIT/ML INJECTION    Inject 35 Units into the skin 3 (three) times daily before meals.    IPRATROPIUM-ALBUTEROL (DUONEB) 0.5-2.5 (3) MG/3ML SOLN    Take 3 mLs by nebulization every 6 (six) hours as needed (sob).    METHOTREXATE (RHEUMATREX) 10 MG TABLET    Take 10 mg by mouth every Wednesday. Caution: Chemotherapy. Protect from light.   METOPROLOL TARTRATE (LOPRESSOR) 25 MG TABLET    Take 12.5 mg by mouth 2 (two) times daily.    MULTIPLE VITAMINS-MINERALS (DECUBI-VITE) CAPS    Take 1 capsule by mouth daily at 3 pm.   NITROGLYCERIN (NITROSTAT) 0.4 MG SL TABLET    Place 0.4 mg under the tongue every 5 (five) minutes as needed for chest pain.   NYSTATIN POWD    Apply 1 application transdermally two times a day for rash.  Apply to reddened areas under right side folds   OMEPRAZOLE (PRILOSEC) 20 MG CAPSULE    Take 20 mg by mouth daily.   OXYGEN    Inhale into the lungs. O2 at 2l/min via nasal canula as needed for shortness of breath   POTASSIUM CHLORIDE (KLOR-CON) 20 MEQ PACKET    Take 40 mEq by mouth daily. 0800   PREDNISONE (DELTASONE)  10 MG TABLET    Take 10 mg by mouth daily.   ROPINIROLE (REQUIP)  0.5 MG TABLET    Take 0.5 mg by mouth at bedtime. 2100   SENNOSIDES-DOCUSATE SODIUM (SENOKOT-S) 8.6-50 MG TABLET    Take 2 tablets by mouth at bedtime. 2100   SKIN PROTECTANTS, MISC. (CALAZIME SKIN PROTECTANT EX)    Apply topically. Apply to Sacral area daily as needed for redness   TORSEMIDE (DEMADEX) 20 MG TABLET    Take 20 mg by mouth 2 (two) times daily.   TRAMADOL-ACETAMINOPHEN (ULTRACET) 37.5-325 MG TABLET    Take 1-2 tablets by mouth every 6 (six) hours as needed for moderate pain.   TRAZODONE (DESYREL) 50 MG TABLET    Take 50 mg by mouth at bedtime.   Modified Medications   No medications on file  Discontinued Medications   No medications on file     SIGNIFICANT DIAGNOSTIC EXAMS   10-12-15: chest x-ray: Enlarged cardiac silhouette. Pulmonary vascular congestion.  10-18-15: EEG: This is a normal EEG for the patients stated age.  There were no focal, hemispheric or lateralizing features.  No epileptiform activity was recorded.  A normal EEG does not exclude the diagnosis of a seizure disorder and if seizure remains high on the list of differential diagnosis, an ambulatory EEG may be of value.  Clinical correlation is required.  01-11-16: chest x-ray: Minimal left base atelectasis.  Otherwise, normal exam.  01-11-16: ct of head: No acute intracranial abnormality. Stable mild cerebral atrophy and chronic white matter disease. No definite acute cortical infarction.  01-11-16: scrotal ultrasound: 1. Left epididymo-orchitis. 2. Microlithiasis involving the left testicle. 3. Three adjacent complex cysts/spermatoceles involving the left epididymal head.   LABS REVIEWED:      05-27-16: hgb a1c 11.7 07-20-16: stool for c-diff: +  07-30-16: wbc 15.1; hgb 12.4; hct 37.7; mcv 93.5; plt 289; glucose 322; bun 28.3; creat 0.91; k+ 4.3; na++ 137; liver normal albumin 3.4  12-13-16: urine micro-albumin 54.44 01-29-17: wbc 15.0; hgb 12.6;  hct 38.1; mcv 93.0; plt 255; glucose 418; bun 29.1 creat 1.03; k+ 4.1; n++ 136; ca 8.6; liver normal albumin 3.3; hgb a1c 9.1     Review of Systems  Constitutional: Negative for malaise/fatigue.  Respiratory: Negative for cough and shortness of breath.   Cardiovascular: negative for leg swelling . Negative for chest pain and palpitations.  Gastrointestinal: Negative for heartburn, abdominal pain and constipation.  Musculoskeletal:no complaints of pain  Skin: no complaints rash on legs and arms is better  Neurological: Negative for dizziness.  Psychiatric/Behavioral: The patient is not nervous/anxious.        Physical Exam  Constitutional: He is oriented to person, place, and time. No distress.  Obese   Eyes: Conjunctivae are normal.  Neck: Neck supple. No JVD present. No thyromegaly present.  Cardiovascular: Normal rate, regular rhythm and intact distal pulses.   Respiratory: Effort normal and breath sounds normal. No respiratory distress. He has no wheezes.  GI: Soft. Bowel sounds are normal. He exhibits no distension. There is no tenderness.  Genitourinary:  Has foley has penile tear    Musculoskeletal: He exhibits edema.  Able to move all extremities Trace bilateral lower extremity edema     Lymphadenopathy:    He has no cervical adenopathy.  Neurological: alert  Skin: Skin is warm and dry. He is not diaphoretic.  His pemphigoid rash is improving on his legs and arms.     ASSESSMENT/ PLAN:  1. bullous pemphigoid:  He has declined to see dermatologist any further; will continue  atarax 25 mg three times daily  Will increase methotrexate to 10 mg weekly. Will continue chronic prednisone 10 mg daily and will monitor  2. Hypertension: b/p 140/80  will continue norvasc 5 mg daily  Lopressor 12.5 mg twice daily asa 81 mg daily   3. Gerd: will continue  prilosec 40 mg daily   4.  Diastolic heart failure:  2-d done 05-28-16 (no EF) : will continue demadex 20 mg twice daily  with k+ 40 meq daily  Will monitor  5. Diabetes: hgb a1c 9.1 (previous  11.7); will continue  humalog 35  units after meals and will increase to  tuojeo 40 units nightly .   6.  OSA: is on cpap;   7. Diabetic neuropathy: will continue neurontin 100 mg twice daily has ultracet 1 or 2 tabs every 6 hours as needed  8. Depression will continue lexapro 10 mg daily has trazodone 50 mg nightly    9.  History of CVA: is neurologically stable; will continue asa 81 mg daily   10.  Allergic rhinitis: will continue mucinex 600 mg twice daily duoneb four times daily as needed   11. RLS: will continue requip 0.5 mg nightly   12. CAD: no complaint of chest pain: will continue asa 81 mg daily and prn ntg  13. Constipation: will continue senna s 2 tabs nightly   13. Neurogenic bladder: has chronic foley   14. Dyslipidemia: will continue zetia 10 mg daily     Ok Edwards NP [facility]  Contact [phone] Monday through Friday 8am- 5pm  After hours call [phone]

## 2017-03-12 DIAGNOSIS — L98419 Non-pressure chronic ulcer of buttock with unspecified severity: Secondary | ICD-10-CM | POA: Diagnosis not present

## 2017-03-12 DIAGNOSIS — M6281 Muscle weakness (generalized): Secondary | ICD-10-CM | POA: Diagnosis not present

## 2017-03-12 DIAGNOSIS — M25512 Pain in left shoulder: Secondary | ICD-10-CM | POA: Diagnosis not present

## 2017-03-12 DIAGNOSIS — R55 Syncope and collapse: Secondary | ICD-10-CM | POA: Diagnosis not present

## 2017-03-13 DIAGNOSIS — M6281 Muscle weakness (generalized): Secondary | ICD-10-CM | POA: Diagnosis not present

## 2017-03-13 DIAGNOSIS — M25512 Pain in left shoulder: Secondary | ICD-10-CM | POA: Diagnosis not present

## 2017-03-13 DIAGNOSIS — R55 Syncope and collapse: Secondary | ICD-10-CM | POA: Diagnosis not present

## 2017-03-14 DIAGNOSIS — R55 Syncope and collapse: Secondary | ICD-10-CM | POA: Diagnosis not present

## 2017-03-14 DIAGNOSIS — M6281 Muscle weakness (generalized): Secondary | ICD-10-CM | POA: Diagnosis not present

## 2017-03-14 DIAGNOSIS — M25512 Pain in left shoulder: Secondary | ICD-10-CM | POA: Diagnosis not present

## 2017-03-15 DIAGNOSIS — R55 Syncope and collapse: Secondary | ICD-10-CM | POA: Diagnosis not present

## 2017-03-15 DIAGNOSIS — M6281 Muscle weakness (generalized): Secondary | ICD-10-CM | POA: Diagnosis not present

## 2017-03-15 DIAGNOSIS — M25512 Pain in left shoulder: Secondary | ICD-10-CM | POA: Diagnosis not present

## 2017-03-17 DIAGNOSIS — M25512 Pain in left shoulder: Secondary | ICD-10-CM | POA: Diagnosis not present

## 2017-03-17 DIAGNOSIS — M6281 Muscle weakness (generalized): Secondary | ICD-10-CM | POA: Diagnosis not present

## 2017-03-17 DIAGNOSIS — R55 Syncope and collapse: Secondary | ICD-10-CM | POA: Diagnosis not present

## 2017-03-18 DIAGNOSIS — R55 Syncope and collapse: Secondary | ICD-10-CM | POA: Diagnosis not present

## 2017-03-18 DIAGNOSIS — M25512 Pain in left shoulder: Secondary | ICD-10-CM | POA: Diagnosis not present

## 2017-03-18 DIAGNOSIS — M6281 Muscle weakness (generalized): Secondary | ICD-10-CM | POA: Diagnosis not present

## 2017-03-19 DIAGNOSIS — L98419 Non-pressure chronic ulcer of buttock with unspecified severity: Secondary | ICD-10-CM | POA: Diagnosis not present

## 2017-03-20 DIAGNOSIS — M25512 Pain in left shoulder: Secondary | ICD-10-CM | POA: Diagnosis not present

## 2017-03-20 DIAGNOSIS — M6281 Muscle weakness (generalized): Secondary | ICD-10-CM | POA: Diagnosis not present

## 2017-03-20 DIAGNOSIS — R55 Syncope and collapse: Secondary | ICD-10-CM | POA: Diagnosis not present

## 2017-03-21 DIAGNOSIS — M25512 Pain in left shoulder: Secondary | ICD-10-CM | POA: Diagnosis not present

## 2017-03-21 DIAGNOSIS — R55 Syncope and collapse: Secondary | ICD-10-CM | POA: Diagnosis not present

## 2017-03-21 DIAGNOSIS — M6281 Muscle weakness (generalized): Secondary | ICD-10-CM | POA: Diagnosis not present

## 2017-03-22 DIAGNOSIS — M6281 Muscle weakness (generalized): Secondary | ICD-10-CM | POA: Diagnosis not present

## 2017-03-22 DIAGNOSIS — M25512 Pain in left shoulder: Secondary | ICD-10-CM | POA: Diagnosis not present

## 2017-03-22 DIAGNOSIS — R55 Syncope and collapse: Secondary | ICD-10-CM | POA: Diagnosis not present

## 2017-03-25 DIAGNOSIS — R55 Syncope and collapse: Secondary | ICD-10-CM | POA: Diagnosis not present

## 2017-03-25 DIAGNOSIS — M25512 Pain in left shoulder: Secondary | ICD-10-CM | POA: Diagnosis not present

## 2017-03-25 DIAGNOSIS — M6281 Muscle weakness (generalized): Secondary | ICD-10-CM | POA: Diagnosis not present

## 2017-03-26 DIAGNOSIS — R55 Syncope and collapse: Secondary | ICD-10-CM | POA: Diagnosis not present

## 2017-03-26 DIAGNOSIS — M25512 Pain in left shoulder: Secondary | ICD-10-CM | POA: Diagnosis not present

## 2017-03-26 DIAGNOSIS — L98419 Non-pressure chronic ulcer of buttock with unspecified severity: Secondary | ICD-10-CM | POA: Diagnosis not present

## 2017-03-26 DIAGNOSIS — M6281 Muscle weakness (generalized): Secondary | ICD-10-CM | POA: Diagnosis not present

## 2017-03-27 ENCOUNTER — Encounter: Payer: Self-pay | Admitting: Adult Health

## 2017-03-27 ENCOUNTER — Non-Acute Institutional Stay (SKILLED_NURSING_FACILITY): Payer: Medicare Other | Admitting: Adult Health

## 2017-03-27 DIAGNOSIS — R55 Syncope and collapse: Secondary | ICD-10-CM | POA: Diagnosis not present

## 2017-03-27 DIAGNOSIS — M25512 Pain in left shoulder: Secondary | ICD-10-CM | POA: Diagnosis not present

## 2017-03-27 DIAGNOSIS — T83511A Infection and inflammatory reaction due to indwelling urethral catheter, initial encounter: Secondary | ICD-10-CM

## 2017-03-27 DIAGNOSIS — N390 Urinary tract infection, site not specified: Secondary | ICD-10-CM

## 2017-03-27 DIAGNOSIS — M6281 Muscle weakness (generalized): Secondary | ICD-10-CM | POA: Diagnosis not present

## 2017-03-27 NOTE — Progress Notes (Signed)
Location:   [place] Room Number: 216 B Place of Service:  SNF (31)   CODE STATUS: Full Code  Allergies  Allergen Reactions  . Ace Inhibitors Swelling    Pt tolerates lisinopril  . Lipitor [Atorvastatin Calcium] Swelling  . Metformin And Related Swelling  . Cefadroxil Hives  . Cephalexin Hives  . Morphine And Related Other (See Comments)    Sweating, feels like is "in rocky boat."  . Robaxin [Methocarbamol] Other (See Comments)    Feels like he is shaky    Chief Complaint  Patient presents with  . Acute Visit    dysuria    HPI:  He has been experiencing dysuria and bladder pain and has been feeling "bad" for the past couple of days. He tells me that he is drinking plenty of water without this helping. He states that he has had low grade fevers. He feels as though he has an uti. There are no reports of changes in appetite.    Past Medical History:  Diagnosis Date  . Arthritis    "hands, ankles, knees" (11/10/2014)  . Bladder spasms   . CAD (coronary artery disease)   . CAD in native artery 09/15/2015  . CHF (congestive heart failure) ([place])   . Chronic indwelling Foley catheter   . Chronic lower back pain   . Depression   . Diabetic diarrhea ([place])   . Diabetic neuropathy, painful ([place])   . DVT (deep venous thrombosis) ([facility]) 1980's   LLE  . GERD (gastroesophageal reflux disease)   . Hyperlipidemia   . Hypertension   . Neurogenic bladder   . Obese   . OSA (obstructive sleep apnea)    "they wanted me to wear a mask; I couldn't" (11/10/2014)  . Pneumonia 1999  . PONV (postoperative nausea and vomiting)   . Stroke [facility]) 10/2011; 06/2014   right hand numbness/notes 10/20/2011, pt not sure he had stroke in 2013; "right hand weaker and right face not quite right since" (11/10/2014)  . Type II diabetes mellitus ([place])   . Uncontrolled type II diabetes with peripheral autonomic neuropathy ([place]) 05/09/2015    Past Surgical History:  Procedure Laterality Date  .  CARDIAC CATHETERIZATION N/A 05/02/2015   Procedure: Left Heart Cath and Coronary Angiography;  Surgeon: Lorretta Harp, MD;  Location: [place] CV LAB;  Service: Cardiovascular;  Laterality: N/A;  . CARDIAC CATHETERIZATION N/A 05/04/2015   Procedure: Left Heart Cath and Coronary Angiography;  Surgeon: Wellington Hampshire, MD;  Location: [place] CV LAB;  Service: Cardiovascular;  Laterality: N/A;  . CARPAL TUNNEL RELEASE Bilateral ~ 2003-2004  . CHOLECYSTECTOMY OPEN  1970's?  Marland Kitchen GASTROPLASTY    . JOINT REPLACEMENT    . LEFT HEART CATHETERIZATION WITH CORONARY ANGIOGRAM N/A 10/24/2011   Procedure: LEFT HEART CATHETERIZATION WITH CORONARY ANGIOGRAM;  Surgeon: Candee Furbish, MD;  Location: [facility] CATH LAB;  Service: Cardiovascular;  Laterality: N/A;  right radial artery approach  . TOTAL KNEE ARTHROPLASTY Right 1990's    Social History   Social History  . Marital status: Widowed    Spouse name: N/A  . Number of children: N/A  . Years of education: N/A   Occupational History  . Retired    Social History Main Topics  . Smoking status: Former Smoker    Packs/day: 2.00    Years: 20.00    Types: Cigarettes    Quit date: 09/10/1976  . Smokeless tobacco: Never Used  . Alcohol use No  Comment: FORMER ALCOHOLIC; "sober since 65/11/5463"  . Drug use: No  . Sexual activity: No   Other Topics Concern  . Not on file   Social History Narrative   Lives alone.  No close family nearby.     Family History  Problem Relation Age of Onset  . Diabetes type I Father   . Hypertension Mother   . Cancer Brother         Testicular Cancer  . Cancer Brother        Leukemia      VITAL SIGNS BP 124/74   Pulse 80   Temp 99.1 F (37.3 C)   Resp 16   Wt (!) 348 lb 3.2 oz (157.9 kg)   SpO2 98%   BMI 45.94 kg/m   Patient's Medications  New Prescriptions   No medications on file  Previous Medications   ALUMINUM-MAGNESIUM HYDROXIDE-SIMETHICONE (MAALOX) 681-275-17 MG/5ML SUSP    Take 30 mLs by  mouth every 6 (six) hours as needed.   AMLODIPINE (NORVASC) 5 MG TABLET    Take 5 mg by mouth daily.   ASPIRIN EC 81 MG TABLET    Take 81 mg by mouth daily. 0900   CLOTRIMAZOLE 1 % OINT    Apply to trunk topically every day and night shift for itching   ESCITALOPRAM (LEXAPRO) 10 MG TABLET    Take 10 mg by mouth daily.    EZETIMIBE (ZETIA) 10 MG TABLET    Take 10 mg by mouth daily.   FOLIC ACID (FOLVITE) 1 MG TABLET    Take 1 mg by mouth daily.   GABAPENTIN (NEURONTIN) 100 MG CAPSULE    Take 1 capsule (100 mg total) by mouth 2 (two) times daily.   GUAIFENESIN (MUCINEX) 600 MG 12 HR TABLET    Take 600 mg by mouth 2 (two) times daily.   HYDROXYZINE (ATARAX/VISTARIL) 25 MG TABLET    Take 25 mg by mouth 3 (three) times daily.    INSULIN GLARGINE (TOUJEO SOLOSTAR) 300 UNIT/ML SOPN    Inject 40 Units into the skin at bedtime.    INSULIN LISPRO (HUMALOG) 100 UNIT/ML INJECTION    Inject 35 Units into the skin 3 (three) times daily before meals.    IPRATROPIUM-ALBUTEROL (DUONEB) 0.5-2.5 (3) MG/3ML SOLN    Take 3 mLs by nebulization every 6 (six) hours as needed (sob).    METHOTREXATE (RHEUMATREX) 10 MG TABLET    Take 10 mg by mouth every Wednesday. Caution: Chemotherapy. Protect from light.   METOPROLOL TARTRATE (LOPRESSOR) 25 MG TABLET    Take 12.5 mg by mouth 2 (two) times daily.    MULTIPLE VITAMINS-MINERALS (DECUBI-VITE) CAPS    Take 1 capsule by mouth daily at 3 pm.   NITROGLYCERIN (NITROSTAT) 0.4 MG SL TABLET    Place 0.4 mg under the tongue every 5 (five) minutes as needed for chest pain.   NYSTATIN POWD    Apply 1 application transdermally two times a day for rash.  Apply to reddened areas under right side folds   OMEPRAZOLE (PRILOSEC) 20 MG CAPSULE    Take 20 mg by mouth daily.   OXYGEN    Inhale into the lungs. O2 at 2l/min via nasal canula as needed for shortness of breath   POTASSIUM CHLORIDE (KLOR-CON) 20 MEQ PACKET    Take 40 mEq by mouth daily. 0800   PREDNISONE (DELTASONE) 10 MG TABLET     Take 10 mg by mouth daily.   ROPINIROLE (REQUIP) 0.5 MG TABLET  Take 0.5 mg by mouth at bedtime. 2100   SENNOSIDES-DOCUSATE SODIUM (SENOKOT-S) 8.6-50 MG TABLET    Take 2 tablets by mouth at bedtime. 2100   SKIN PROTECTANTS, MISC. (CALAZIME SKIN PROTECTANT EX)    Apply topically. Apply to Sacral area daily as needed for redness   TORSEMIDE (DEMADEX) 20 MG TABLET    Take 20 mg by mouth 2 (two) times daily.   TRAMADOL-ACETAMINOPHEN (ULTRACET) 37.5-325 MG TABLET    Take 1-2 tablets by mouth every 6 (six) hours as needed for moderate pain.   TRAZODONE (DESYREL) 50 MG TABLET    Take 50 mg by mouth at bedtime.   Modified Medications   No medications on file  Discontinued Medications   No medications on file     SIGNIFICANT DIAGNOSTIC EXAMS  PREVIOUS  10-12-15: chest x-ray: Enlarged cardiac silhouette. Pulmonary vascular congestion.  10-18-15: EEG: This is a normal EEG for the patients stated age.  There were no focal, hemispheric or lateralizing features.  No epileptiform activity was recorded.  A normal EEG does not exclude the diagnosis of a seizure disorder and if seizure remains high on the list of differential diagnosis, an ambulatory EEG may be of value.  Clinical correlation is required.  01-11-16: chest x-ray: Minimal left base atelectasis.  Otherwise, normal exam.  01-11-16: ct of head: No acute intracranial abnormality. Stable mild cerebral atrophy and chronic white matter disease. No definite acute cortical infarction.  01-11-16: scrotal ultrasound: 1. Left epididymo-orchitis. 2. Microlithiasis involving the left testicle. 3. Three adjacent complex cysts/spermatoceles involving the left epididymal head.  NO NEW EXAMS  LABS REVIEWED: PREVIOUS     05-27-16: hgb a1c 11.7 07-20-16: stool for c-diff: +  07-30-16: wbc 15.1; hgb 12.4; hct 37.7; mcv 93.5; plt 289; glucose 322; bun 28.3; creat 0.91; k+ 4.3; na++ 137; liver normal albumin 3.4  12-13-16: urine micro-albumin 54.44 01-29-17: wbc  15.0; hgb 12.6; hct 38.1; mcv 93.0; plt 255; glucose 418; bun 29.1 creat 1.03; k+ 4.1; n++ 136; ca 8.6; liver normal albumin 3.3; hgb a1c 9.1   NO NEW LABS  Review of Systems  Constitutional: Positive for fever and malaise/fatigue.  Respiratory: Negative for cough and shortness of breath.   Cardiovascular: Negative for chest pain, palpitations and leg swelling.  Gastrointestinal: Negative for abdominal pain, constipation and heartburn.  Genitourinary: Positive for dysuria.       Has bladder pain   Musculoskeletal: Negative for back pain, joint pain and myalgias.  Skin: Negative.   Neurological: Negative for dizziness.  Psychiatric/Behavioral: The patient is not nervous/anxious.     Physical Exam  Constitutional: He is oriented to person, place, and time. No distress.  Eyes: Conjunctivae are normal.  Neck: Neck supple. No JVD present. No thyromegaly present.  Cardiovascular: Normal rate, regular rhythm and intact distal pulses.   Respiratory: Effort normal and breath sounds normal. No respiratory distress. He has no wheezes.  GI: Soft. Bowel sounds are normal. He exhibits no distension. There is tenderness.  Has suprapubic pain   Genitourinary:  Genitourinary Comments: Has foley  Has penile tear   Musculoskeletal: He exhibits no edema.  Able to move all extremities   Lymphadenopathy:    He has no cervical adenopathy.  Neurological: He is alert and oriented to person, place, and time.  Skin: Skin is warm and dry. He is not diaphoretic.  Rash is improving   Psychiatric: He has a normal mood and affect.    ASSESSMENT/ PLAN:  1. UTI: will get a  UA/C&S and will treat as indicated based upon culture results and will continue to monitor his status.     MD is aware of resident's narcotic use and is in agreement with current plan of care. We will attempt to wean resident as apropriate     Ok Edwards NP [facility]  Contact [phone] Monday through Friday 8am-  5pm  After hours call [phone]

## 2017-03-28 ENCOUNTER — Non-Acute Institutional Stay (SKILLED_NURSING_FACILITY): Payer: Medicare Other

## 2017-03-28 DIAGNOSIS — M25512 Pain in left shoulder: Secondary | ICD-10-CM | POA: Diagnosis not present

## 2017-03-28 DIAGNOSIS — Z79899 Other long term (current) drug therapy: Secondary | ICD-10-CM | POA: Diagnosis not present

## 2017-03-28 DIAGNOSIS — E08628 Diabetes mellitus due to underlying condition with other skin complications: Secondary | ICD-10-CM | POA: Diagnosis not present

## 2017-03-28 DIAGNOSIS — Z0000 Encounter for general adult medical examination without abnormal findings: Secondary | ICD-10-CM

## 2017-03-28 DIAGNOSIS — M6281 Muscle weakness (generalized): Secondary | ICD-10-CM | POA: Diagnosis not present

## 2017-03-28 DIAGNOSIS — R319 Hematuria, unspecified: Secondary | ICD-10-CM | POA: Diagnosis not present

## 2017-03-28 DIAGNOSIS — N390 Urinary tract infection, site not specified: Secondary | ICD-10-CM | POA: Diagnosis not present

## 2017-03-28 DIAGNOSIS — E1169 Type 2 diabetes mellitus with other specified complication: Secondary | ICD-10-CM | POA: Diagnosis not present

## 2017-03-28 DIAGNOSIS — E08311 Diabetes mellitus due to underlying condition with unspecified diabetic retinopathy with macular edema: Secondary | ICD-10-CM | POA: Diagnosis not present

## 2017-03-28 DIAGNOSIS — R55 Syncope and collapse: Secondary | ICD-10-CM | POA: Diagnosis not present

## 2017-03-28 DIAGNOSIS — N319 Neuromuscular dysfunction of bladder, unspecified: Secondary | ICD-10-CM | POA: Diagnosis not present

## 2017-03-28 DIAGNOSIS — E119 Type 2 diabetes mellitus without complications: Secondary | ICD-10-CM | POA: Diagnosis not present

## 2017-03-28 LAB — MICROALBUMIN, URINE: MICROALB UR: 14.9

## 2017-03-28 NOTE — Patient Instructions (Signed)
[name] , Thank you for taking time to come for your Medicare Wellness Visit. I appreciate your ongoing commitment to your health goals. Please review the following plan we discussed and let me know if I can assist you in the future.   Screening recommendations/referrals: Colonoscopy up to date, pt is long term Recommended yearly ophthalmology/optometry visit for glaucoma screening and checkup Recommended yearly dental visit for hygiene and checkup  Vaccinations: Influenza vaccine due 2018 fall season Pneumococcal vaccine 13 due Tdap vaccine due Shingles vaccine not in records   Advanced directives: Need a copy for chart  Conditions/risks identified: None  Next appointment: Dr. Eulas Wood makes rounds.  Preventive Care 69 Years and Older, Male Preventive care refers to lifestyle choices and visits with your health care provider that can promote health and wellness. What does preventive care include?  A yearly physical exam. This is also called an annual well check.  Dental exams once or twice a year.  Routine eye exams. Ask your health care provider how often you should have your eyes checked.  Personal lifestyle choices, including:  Daily care of your teeth and gums.  Regular physical activity.  Eating a healthy diet.  Avoiding tobacco and drug use.  Limiting alcohol use.  Practicing safe sex.  Taking low doses of aspirin every day.  Taking vitamin and mineral supplements as recommended by your health care provider. What happens during an annual well check? The services and screenings done by your health care provider during your annual well check will depend on your age, overall health, lifestyle risk factors, and family history of disease. Counseling  Your health care provider may ask you questions about your:  Alcohol use.  Tobacco use.  Drug use.  Emotional well-being.  Home and relationship well-being.  Sexual activity.  Eating habits.  History  of falls.  Memory and ability to understand (cognition).  Work and work Statistician. Screening  You may have the following tests or measurements:  Height, weight, and BMI.  Blood pressure.  Lipid and cholesterol levels. These may be checked every 5 years, or more frequently if you are over 69 years old.  Skin check.  Lung cancer screening. You may have this screening every year starting at age 50 if you have a 30-pack-year history of smoking and currently smoke or have quit within the past 15 years.  Fecal occult blood test (FOBT) of the stool. You may have this test every year starting at age 78.  Flexible sigmoidoscopy or colonoscopy. You may have a sigmoidoscopy every 5 years or a colonoscopy every 10 years starting at age 69.  Prostate cancer screening. Recommendations will vary depending on your family history and other risks.  Hepatitis C blood test.  Hepatitis B blood test.  Sexually transmitted disease (STD) testing.  Diabetes screening. This is done by checking your blood sugar (glucose) after you have not eaten for a while (fasting). You may have this done every 1-3 years.  Abdominal aortic aneurysm (AAA) screening. You may need this if you are a current or former smoker.  Osteoporosis. You may be screened starting at age 69 if you are at high risk. Talk with your health care provider about your test results, treatment options, and if necessary, the need for more tests. Vaccines  Your health care provider may recommend certain vaccines, such as:  Influenza vaccine. This is recommended every year.  Tetanus, diphtheria, and acellular pertussis (Tdap, Td) vaccine. You may need a Td booster every 10 years.  Zoster vaccine. You may need this after age 69.  Pneumococcal 13-valent conjugate (PCV13) vaccine. One dose is recommended after age 69.  Pneumococcal polysaccharide (PPSV23) vaccine. One dose is recommended after age 69. Talk to your health care provider  about which screenings and vaccines you need and how often you need them. This information is not intended to replace advice given to you by your health care provider. Make sure you discuss any questions you have with your health care provider. Document Released: 09/23/2015 Document Revised: 05/16/2016 Document Reviewed: 06/28/2015 Elsevier Interactive Patient Education  2017 [place] Prevention in the Home Falls can cause injuries. They can happen to people of all ages. There are many things you can do to make your home safe and to help prevent falls. What can I do on the outside of my home?  Regularly fix the edges of walkways and driveways and fix any cracks.  Remove anything that might make you trip as you walk through a door, such as a raised step or threshold.  Trim any bushes or trees on the path to your home.  Use bright outdoor lighting.  Clear any walking paths of anything that might make someone trip, such as rocks or tools.  Regularly check to see if handrails are loose or broken. Make sure that both sides of any steps have handrails.  Any raised decks and porches should have guardrails on the edges.  Have any leaves, snow, or ice cleared regularly.  Use sand or salt on walking paths during winter.  Clean up any spills in your garage right away. This includes oil or grease spills. What can I do in the bathroom?  Use night lights.  Install grab bars by the toilet and in the tub and shower. Do not use towel bars as grab bars.  Use non-skid mats or decals in the tub or shower.  If you need to sit down in the shower, use a plastic, non-slip stool.  Keep the floor dry. Clean up any water that spills on the floor as soon as it happens.  Remove soap buildup in the tub or shower regularly.  Attach bath mats securely with double-sided non-slip rug tape.  Do not have throw rugs and other things on the floor that can make you trip. What can I do in the  bedroom?  Use night lights.  Make sure that you have a light by your bed that is easy to reach.  Do not use any sheets or blankets that are too big for your bed. They should not hang down onto the floor.  Have a firm chair that has side arms. You can use this for support while you get dressed.  Do not have throw rugs and other things on the floor that can make you trip. What can I do in the kitchen?  Clean up any spills right away.  Avoid walking on wet floors.  Keep items that you use a lot in easy-to-reach places.  If you need to reach something above you, use a strong step stool that has a grab bar.  Keep electrical cords out of the way.  Do not use floor polish or wax that makes floors slippery. If you must use wax, use non-skid floor wax.  Do not have throw rugs and other things on the floor that can make you trip. What can I do with my stairs?  Do not leave any items on the stairs.  Make sure that there are  handrails on both sides of the stairs and use them. Fix handrails that are broken or loose. Make sure that handrails are as long as the stairways.  Check any carpeting to make sure that it is firmly attached to the stairs. Fix any carpet that is loose or worn.  Avoid having throw rugs at the top or bottom of the stairs. If you do have throw rugs, attach them to the floor with carpet tape.  Make sure that you have a light switch at the top of the stairs and the bottom of the stairs. If you do not have them, ask someone to add them for you. What else can I do to help prevent falls?  Wear shoes that:  Do not have high heels.  Have rubber bottoms.  Are comfortable and fit you well.  Are closed at the toe. Do not wear sandals.  If you use a stepladder:  Make sure that it is fully opened. Do not climb a closed stepladder.  Make sure that both sides of the stepladder are locked into place.  Ask someone to hold it for you, if possible.  Clearly mark and make  sure that you can see:  Any grab bars or handrails.  First and last steps.  Where the edge of each step is.  Use tools that help you move around (mobility aids) if they are needed. These include:  Canes.  Walkers.  Scooters.  Crutches.  Turn on the lights when you go into a dark area. Replace any light bulbs as soon as they burn out.  Set up your furniture so you have a clear path. Avoid moving your furniture around.  If any of your floors are uneven, fix them.  If there are any pets around you, be aware of where they are.  Review your medicines with your doctor. Some medicines can make you feel dizzy. This can increase your chance of falling. Ask your doctor what other things that you can do to help prevent falls. This information is not intended to replace advice given to you by your health care provider. Make sure you discuss any questions you have with your health care provider. Document Released: 06/23/2009 Document Revised: 02/02/2016 Document Reviewed: 10/01/2014 Elsevier Interactive Patient Education  2017 [employer].

## 2017-03-28 NOTE — Progress Notes (Signed)
Subjective:   [name] is a 71 y.o. male who presents for an Initial Medicare Annual Wellness Visit at    Objective:    Today's Vitals   03/28/17 1136  BP: 130/80  Pulse: 87  Temp: 97.6 F (36.4 C)  TempSrc: Oral  SpO2: 94%  Weight: (!) 348 lb (157.9 kg)  Height: 6\' 1"  (1.854 m)  PainSc: 4    Body mass index is 45.91 kg/m.  Current Medications (verified) Outpatient Encounter Prescriptions as of 03/28/2017  Medication Sig  . aluminum-magnesium hydroxide-simethicone (MAALOX) 696-295-28 MG/5ML SUSP Take 30 mLs by mouth every 6 (six) hours as needed.  Marland Kitchen amLODipine (NORVASC) 5 MG tablet Take 5 mg by mouth daily.  Marland Kitchen aspirin EC 81 MG tablet Take 81 mg by mouth daily. 0900  . Clotrimazole 1 % OINT Apply to trunk topically every day and night shift for itching  . escitalopram (LEXAPRO) 10 MG tablet Take 10 mg by mouth daily.   Marland Kitchen ezetimibe (ZETIA) 10 MG tablet Take 10 mg by mouth daily.  . folic acid (FOLVITE) 1 MG tablet Take 1 mg by mouth daily.  Marland Kitchen gabapentin (NEURONTIN) 100 MG capsule Take 1 capsule (100 mg total) by mouth 2 (two) times daily.  Marland Kitchen guaiFENesin (MUCINEX) 600 MG 12 hr tablet Take 600 mg by mouth 2 (two) times daily.  . hydrOXYzine (ATARAX/VISTARIL) 25 MG tablet Take 25 mg by mouth 3 (three) times daily.   . Insulin Glargine (TOUJEO SOLOSTAR) 300 UNIT/ML SOPN Inject 40 Units into the skin at bedtime.   . insulin lispro (HUMALOG) 100 UNIT/ML injection Inject 35 Units into the skin 3 (three) times daily before meals.   Marland Kitchen ipratropium-albuterol (DUONEB) 0.5-2.5 (3) MG/3ML SOLN Take 3 mLs by nebulization every 6 (six) hours as needed (sob).   . methotrexate (RHEUMATREX) 10 MG tablet Take 10 mg by mouth every Wednesday. Caution: Chemotherapy. Protect from light.  . metoprolol tartrate (LOPRESSOR) 25 MG tablet Take 12.5 mg by mouth 2 (two) times daily.   . Multiple Vitamins-Minerals (DECUBI-VITE) CAPS Take 1 capsule by mouth daily at 3 pm.  . nitroGLYCERIN (NITROSTAT)  0.4 MG SL tablet Place 0.4 mg under the tongue every 5 (five) minutes as needed for chest pain.  Marland Kitchen Nystatin POWD Apply 1 application transdermally two times a day for rash.  Apply to reddened areas under right side folds  . omeprazole (PRILOSEC) 20 MG capsule Take 20 mg by mouth daily.  . OXYGEN Inhale into the lungs. O2 at 2l/min via nasal canula as needed for shortness of breath  . potassium chloride (KLOR-CON) 20 MEQ packet Take 40 mEq by mouth daily. 0800  . predniSONE (DELTASONE) 10 MG tablet Take 10 mg by mouth daily.  Marland Kitchen rOPINIRole (REQUIP) 0.5 MG tablet Take 0.5 mg by mouth at bedtime. 2100  . sennosides-docusate sodium (SENOKOT-S) 8.6-50 MG tablet Take 2 tablets by mouth at bedtime. 2100  . Skin Protectants, Misc. (CALAZIME SKIN PROTECTANT EX) Apply topically. Apply to Sacral area daily as needed for redness  . torsemide (DEMADEX) 20 MG tablet Take 20 mg by mouth 2 (two) times daily.  . traMADol-acetaminophen (ULTRACET) 37.5-325 MG tablet Take 1-2 tablets by mouth every 6 (six) hours as needed for moderate pain.  . traZODone (DESYREL) 50 MG tablet Take 50 mg by mouth at bedtime.    No facility-administered encounter medications on file as of 03/28/2017.     Allergies (verified) Ace inhibitors; Lipitor [atorvastatin calcium]; Metformin and related; Cefadroxil; Cephalexin; Morphine and  related; and Robaxin [methocarbamol]   History: Past Medical History:  Diagnosis Date  . Arthritis    "hands, ankles, knees" (11/10/2014)  . Bladder spasms   . CAD (coronary artery disease)   . CAD in native artery 09/15/2015  . CHF (congestive heart failure) ([place])   . Chronic indwelling Foley catheter   . Chronic lower back pain   . Depression   . Diabetic diarrhea ([place])   . Diabetic neuropathy, painful ([place])   . DVT (deep venous thrombosis) ([facility]) 1980's   LLE  . GERD (gastroesophageal reflux disease)   . Hyperlipidemia   . Hypertension   . Neurogenic bladder   . Obese   . OSA (obstructive  sleep apnea)    "they wanted me to wear a mask; I couldn't" (11/10/2014)  . Pneumonia 1999  . PONV (postoperative nausea and vomiting)   . Stroke [facility]) 10/2011; 06/2014   right hand numbness/notes 10/20/2011, pt not sure he had stroke in 2013; "right hand weaker and right face not quite right since" (11/10/2014)  . Type II diabetes mellitus ([place])   . Uncontrolled type II diabetes with peripheral autonomic neuropathy ([place]) 05/09/2015   Past Surgical History:  Procedure Laterality Date  . CARDIAC CATHETERIZATION N/A 05/02/2015   Procedure: Left Heart Cath and Coronary Angiography;  Surgeon: Lorretta Harp, MD;  Location: [facility];  Service: Cardiovascular;  Laterality: N/A;  . CARDIAC CATHETERIZATION N/A 05/04/2015   Procedure: Left Heart Cath and Coronary Angiography;  Surgeon: Wellington Hampshire, MD;  Location: [facility];  Service: Cardiovascular;  Laterality: N/A;  . CARPAL TUNNEL RELEASE Bilateral ~ 2003-2004  . CHOLECYSTECTOMY OPEN  1970's?  Marland Kitchen GASTROPLASTY    . JOINT REPLACEMENT    . LEFT HEART CATHETERIZATION WITH CORONARY ANGIOGRAM N/A 10/24/2011   Procedure: LEFT HEART CATHETERIZATION WITH CORONARY ANGIOGRAM;  Surgeon: Candee Furbish, MD;  Location[facility] CATH LAB;  Service: Cardiovascular;  Laterality: N/A;  right radial artery approach  . TOTAL KNEE ARTHROPLASTY Right 1990's   Family History  Problem Relation Age of Onset  . Diabetes type I Father   . Hypertension Mother   . Cancer Brother         Testicular Cancer  . Cancer Brother        Leukemia   Social History   Occupational History  . Retired    Social History Main Topics  . Smoking status: Former Smoker    Packs/day: 2.00    Years: 20.00    Types: Cigarettes    Quit date: 09/10/1976  . Smokeless tobacco: Never Used  . Alcohol use No     Comment: FORMER ALCOHOLIC; "sober since 16/06/9603"  . Drug use: No  . Sexual activity: No   Tobacco Counseling Counseling given: Not Answered   Activities of Daily  Living In your present state of health, do you have any difficulty performing the following activities: 03/28/2017  Hearing? N  Vision? N  Difficulty concentrating or making decisions? Y  Walking or climbing stairs? Y  Dressing or bathing? Y  Doing errands, shopping? Y  Preparing Food and eating ? Y  Using the Toilet? Y  In the past six months, have you accidently leaked urine? Y  Do you have problems with loss of bowel control? Y  Managing your Medications? Y  Managing your Finances? Y  Housekeeping or managing your Housekeeping? Y  Some recent data might be hidden    Immunizations and Health Maintenance Immunization History  Administered  Date(s) Administered  . Influenza Split 10/12/2011, 10/13/2011  . Influenza,inj,Quad PF,36+ Mos 05/06/2015  . Influenza-Unspecified 06/10/2014  . PPD Test 10/13/2015, 10/27/2015, 05/25/2016   There are no preventive care reminders to display for this patient.  Patient Care Team: Gildardo Cranker, DO as PCP - General (Internal Medicine) Nyoka Cowden Phylis Bougie, NP as Nurse Practitioner ([place]) [facility] [place] ([place])  Indicate any recent [place] you may have received from other than [facility] providers in the past year (date may be approximate).    Assessment:   This is a routine wellness examination for Yoseph.   Hearing/Vision screen No exam data present  Dietary issues and exercise activities discussed: Current Exercise Habits: The patient does not participate in regular exercise at present, Exercise limited by: respiratory conditions(s)  Goals    None     Depression Screen PHQ 2/9 Scores 03/28/2017 10/24/2015 09/28/2015 09/22/2015  PHQ - 2 Score 0 1 0 0  PHQ- 9 Score - - - -    Fall Risk Fall Risk  03/28/2017 10/24/2015 09/28/2015 09/22/2015 07/15/2015  Falls in the past year? Yes Yes Yes Yes Yes  Number falls in past yr: 2 or more 2 or more 2 or more 2 or more 2 or more  Injury with Fall? -  Yes Yes Yes Yes  Risk Factor Category  - High Fall Risk High Fall Risk High Fall Risk High Fall Risk  Risk for fall due to : - History of fall(s);Impaired balance/gait;Impaired mobility History of fall(s);Impaired balance/gait;Impaired mobility Impaired mobility;Impaired balance/gait Impaired balance/gait;Impaired mobility  Follow up - Education provided;Falls prevention discussed Education provided;Falls prevention discussed Education provided;Falls prevention discussed Education provided;Falls prevention discussed    Cognitive Function:     6CIT Screen 03/28/2017  What Year? 0 points  What month? 0 points  What time? 0 points  Count back from 20 0 points  Months in reverse 2 points  Repeat phrase 2 points  Total Score 4    Screening Tests Health Maintenance  Topic Date Due  . COLONOSCOPY  12/12/2017 (Originally 11/12/1997)  . TETANUS/TDAP  12/12/2017 (Originally [birth year]13/1966)  . Hepatitis C Screening  12/12/2017 (Originally 08-24-1946)  . PNA vac Low Risk Adult (1 of 2 - PCV13) 12/12/2017 (Originally 11/12/2012)  . URINE MICROALBUMIN  03/27/2018 (Originally 03/26/2017)  . INFLUENZA VACCINE  04/10/2017  . OPHTHALMOLOGY EXAM  05/17/2017  . HEMOGLOBIN A1C  08/01/2017  . FOOT EXAM  10/30/2017        Plan:    I have personally reviewed and addressed the Medicare Annual Wellness questionnaire and have noted the following in the patient's chart:  A. Medical and social history B. Use of alcohol, tobacco or illicit drugs  C. Current medications and supplements D. Functional ability and status E.  Nutritional status F.  Physical activity G. Advance directives H. List of other physicians I.  Hospitalizations, surgeries, and ER visits in previous 12 months J.  [place] to include hearing, vision, cognitive, depression L. Referrals and appointments - none  In addition, I have reviewed and discussed with patient certain preventive protocols, quality metrics, and best practice  recommendations. A written personalized care plan for preventive services as well as general preventive health recommendations were provided to patient.  See attached scanned questionnaire for additional information.   Signed,   Rich Reining, RN Nurse Health Advisor   Quick Notes   Health Maintenance: TDAP, Hep C, PNA 13, microalbumin due     Abnormal Screen: 6 CIT-4  Patient Concerns: None     Nurse Concerns: None

## 2017-03-29 DIAGNOSIS — E1169 Type 2 diabetes mellitus with other specified complication: Secondary | ICD-10-CM | POA: Diagnosis not present

## 2017-03-29 DIAGNOSIS — E08628 Diabetes mellitus due to underlying condition with other skin complications: Secondary | ICD-10-CM | POA: Diagnosis not present

## 2017-03-29 DIAGNOSIS — E119 Type 2 diabetes mellitus without complications: Secondary | ICD-10-CM | POA: Diagnosis not present

## 2017-03-29 DIAGNOSIS — E08311 Diabetes mellitus due to underlying condition with unspecified diabetic retinopathy with macular edema: Secondary | ICD-10-CM | POA: Diagnosis not present

## 2017-04-02 DIAGNOSIS — L89322 Pressure ulcer of left buttock, stage 2: Secondary | ICD-10-CM | POA: Diagnosis not present

## 2017-04-09 DIAGNOSIS — L98419 Non-pressure chronic ulcer of buttock with unspecified severity: Secondary | ICD-10-CM | POA: Diagnosis not present

## 2017-04-15 ENCOUNTER — Non-Acute Institutional Stay (SKILLED_NURSING_FACILITY): Payer: Medicare Other | Admitting: Internal Medicine

## 2017-04-15 ENCOUNTER — Encounter: Payer: Self-pay | Admitting: Internal Medicine

## 2017-04-15 DIAGNOSIS — Z79899 Other long term (current) drug therapy: Secondary | ICD-10-CM

## 2017-04-15 DIAGNOSIS — L109 Pemphigus, unspecified: Secondary | ICD-10-CM | POA: Diagnosis not present

## 2017-04-15 DIAGNOSIS — R05 Cough: Secondary | ICD-10-CM

## 2017-04-15 DIAGNOSIS — E1143 Type 2 diabetes mellitus with diabetic autonomic (poly)neuropathy: Secondary | ICD-10-CM | POA: Diagnosis not present

## 2017-04-15 DIAGNOSIS — E785 Hyperlipidemia, unspecified: Secondary | ICD-10-CM

## 2017-04-15 DIAGNOSIS — Z9289 Personal history of other medical treatment: Secondary | ICD-10-CM | POA: Diagnosis not present

## 2017-04-15 DIAGNOSIS — IMO0002 Reserved for concepts with insufficient information to code with codable children: Secondary | ICD-10-CM

## 2017-04-15 DIAGNOSIS — E1165 Type 2 diabetes mellitus with hyperglycemia: Secondary | ICD-10-CM | POA: Diagnosis not present

## 2017-04-15 DIAGNOSIS — E1169 Type 2 diabetes mellitus with other specified complication: Secondary | ICD-10-CM | POA: Diagnosis not present

## 2017-04-15 DIAGNOSIS — R059 Cough, unspecified: Secondary | ICD-10-CM

## 2017-04-15 DIAGNOSIS — R918 Other nonspecific abnormal finding of lung field: Secondary | ICD-10-CM | POA: Diagnosis not present

## 2017-04-15 DIAGNOSIS — I5032 Chronic diastolic (congestive) heart failure: Secondary | ICD-10-CM

## 2017-04-15 DIAGNOSIS — Z978 Presence of other specified devices: Secondary | ICD-10-CM

## 2017-04-15 DIAGNOSIS — Z960 Presence of urogenital implants: Secondary | ICD-10-CM

## 2017-04-15 NOTE — Progress Notes (Signed)
Patient ID: [name], male   DOB: 1946-07-21, 71 y.o.   MRN: [id number]     DATE:  April 15, 2017  Location:   [facility] Room Number: [place] of Service: SNF (31)   Extended Emergency Contact Information Primary Emergency Contact: Whiting,[name] Address: [address]          [place], [place] [address] [name] of [place] Mobile Phone: [phone] Relation: Friend Secondary Emergency Contact: Kelso of [place] Phone: [phone] Mobile Phone: [phone] Relation: Brother  Advanced Directive information Does Patient Have a Medical Advance Directive?: Yes, Type of Advance Directive: Out of facility DNR (pink MOST or yellow form), Pre-existing out of facility DNR order (yellow form or pink MOST form): Pink MOST form placed in chart (order not valid for inpatient use), Does patient want to make changes to medical advance directive?: No - Patient declined  Chief Complaint  Patient presents with  . Medical Management of Chronic Issues    1 month follow up    HPI:  71 yo male long term resident seen today for f/u. He c/o 3-4 day hx productive cough with green sputum. No f/c, CP, SOB, sick contacts. No sinus or ear pressure/pain. No new HA/dizziness. He takes mucinex BID, daily prednisone and torsemide BID as well as prn duoneb. CBGs 250-390s; rarely <200, 400-520s. He reports occasional low BS reaction <50. He checks BS at bedside with personal glucometer.  Appetite ok and sleeps well most nights. He was tx for UTI last month. Albumin 3.3. 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 Takes demadex 20 mg twice daily with k+ 40 meq daily  DM - uncontrolled. A1c 9.1% (previous  11.7); takes humalog 35 units after meals and tuojeo 40 units nightly. He some times refuses full dose of insulin. Urine microalbumin/Cr ratio 323.6 (prev 510.7). He has ACEI allergy (swelling). [id number] (on zetia; lipitor -->swelling)  OSA - stable on CPAP   Diabetic neuropathy - stable on neurontin 100 mg twice daily; ultracet 1 or 2 tabs every 6 hours as needed  Depression - mood stable on lexapro 10 mg daily; trazodone 50 mg nightly    History of CVA - stable. Takes ASA 81 mg daily   Allergic rhinitis - takes mucinex 600 mg twice daily; duoneb four times daily as needed   RLS - stable on requip 0.5 mg nightly   CAD - stable. Takes ASA 81 mg daily and prn ntg. No CP at this time  Constipation - stable on senna s 2 tabs nightly   Neurogenic bladder - s/p chronic foley cath   Dyslipidemia - on zetia 10 mg daily. LDL 163. lipitor --> swelling   Left buttock wound - stage 2; followed by facility wound care provider. Current size 7.2 x 1.1 x 0.1   Past Medical History:  Diagnosis Date  . Arthritis    "hands, ankles, knees" (11/10/2014)  . Bladder spasms   . CAD (coronary artery disease)   .  CAD in native artery 09/15/2015  . CHF (congestive heart failure) ([facility])   . Chronic indwelling Foley catheter   . Chronic lower back pain   . Depression   . Diabetic diarrhea ([facility])   . Diabetic neuropathy, painful ([facility])   . DVT (deep venous thrombosis) (HCC) 1980's   LLE  . GERD (gastroesophageal reflux disease)   . Hyperlipidemia   . Hypertension   . Neurogenic bladder   . Obese   . OSA (obstructive sleep apnea)    "they wanted me to wear a mask; I couldn't" (11/10/2014)  . Pneumonia 1999  . PONV (postoperative nausea and vomiting)   . Stroke [facility]) 10/2011; 06/2014   right hand  numbness/notes 10/20/2011, pt not sure he had stroke in 2013; "right hand weaker and right face not quite right since" (11/10/2014)  . Type II diabetes mellitus ([facility])   . Uncontrolled type II diabetes with peripheral autonomic neuropathy ([place] [facility]) 05/09/2015    Past Surgical History:  Procedure Laterality Date  . CARDIAC CATHETERIZATION N/A 05/02/2015   Procedure: Left Heart Cath and Coronary Angiography;  Surgeon: Lorretta Harp, MD;  Location: [facility] CV LAB;  Service: Cardiovascular;  Laterality: N/A;  . CARDIAC CATHETERIZATION N/A 05/04/2015   Procedure: Left Heart Cath and Coronary Angiography;  Surgeon: Wellington Hampshire, MD;  Location: [facility] CV LAB;  Service: Cardiovascular;  Laterality: N/A;  . CARPAL TUNNEL RELEASE Bilateral ~ 2003-2004  . CHOLECYSTECTOMY OPEN  1970's?  Marland Kitchen GASTROPLASTY    . JOINT REPLACEMENT    . LEFT HEART CATHETERIZATION WITH CORONARY ANGIOGRAM N/A 10/24/2011   Procedure: LEFT HEART CATHETERIZATION WITH CORONARY ANGIOGRAM;  Surgeon: Candee Furbish, MD;  Location: [facility] CATH LAB;  Service: Cardiovascular;  Laterality: N/A;  right radial artery approach  . TOTAL KNEE ARTHROPLASTY Right 1990's    Patient Care Team: Gildardo Cranker, DO as PCP - General (Internal Medicine) Nyoka Cowden Phylis Bougie, NP as Nurse Practitioner ([facility], [place] ([place])  Social History   Social History  . Marital status: Widowed    Spouse name: N/A  . Number of children: N/A  . Years of education: N/A   Occupational History  . Retired    Social History Main Topics  . Smoking status: Former Smoker    Packs/day: 2.00    Years: 20.00    Types: Cigarettes    Quit date: 09/10/1976  . Smokeless tobacco: Never Used  . Alcohol use No     Comment: FORMER ALCOHOLIC; "sober since 40/97/3532"  . Drug use: No  . Sexual activity: No   Other Topics Concern  . Not on file   Social History Narrative   Lives alone.  No close family nearby.        reports that he quit smoking about 40 years ago. His smoking use included Cigarettes. He has a 40.00 pack-year smoking history. He has never used smokeless tobacco. He reports that he does not drink alcohol or use drugs.  Family History  Problem Relation Age of Onset  . Diabetes type I Father   . Hypertension Mother   . Cancer Brother         Testicular Cancer  . Cancer Brother        Leukemia   Family Status  Relation Status  . Father Deceased       DM/DVT  . Mother Deceased       old age  . Brother Alive  . Brother Alive  . MGM Deceased  .  MGF Deceased  . PGM Deceased  . PGF Deceased  . Brother (Not Specified)  . Brother (Not Specified)    Immunization History  Administered Date(s) Administered  . Influenza Split 10/12/2011, 10/13/2011  . Influenza,inj,Quad PF,36+ Mos 05/06/2015  . Influenza-Unspecified 06/10/2014  . PPD Test 10/13/2015, 10/27/2015, 05/25/2016    Allergies  Allergen Reactions  . Ace Inhibitors Swelling    Pt tolerates lisinopril  . Lipitor [Atorvastatin Calcium] Swelling  . Metformin And Related Swelling  . Cefadroxil Hives  . Cephalexin Hives  . Morphine And Related Other (See Comments)    Sweating, feels like is "in rocky boat."  . Robaxin [Methocarbamol] Other (See Comments)    Feels like he is shaky    Medications: Patient's Medications  New Prescriptions   No medications on file  Previous Medications   ALUMINUM-MAGNESIUM HYDROXIDE-SIMETHICONE (MAALOX) 144-818-56 MG/5ML SUSP    Take 30 mLs by mouth every 6 (six) hours as needed.   AMLODIPINE (NORVASC) 5 MG TABLET    Take 5 mg by mouth daily.   ASPIRIN EC 81 MG TABLET    Take 81 mg by mouth daily. 0900   CLOTRIMAZOLE 1 % OINT    Apply to trunk topically every day and night shift for itching   ESCITALOPRAM (LEXAPRO) 10 MG TABLET    Take 10 mg by mouth daily.    EZETIMIBE (ZETIA) 10 MG TABLET    Take 10 mg by mouth daily.   FOLIC ACID (FOLVITE) 1 MG TABLET    Take 1 mg by mouth daily.    GABAPENTIN (NEURONTIN) 100 MG CAPSULE    Take 1 capsule (100 mg total) by mouth 2 (two) times daily.   GUAIFENESIN (MUCINEX) 600 MG 12 HR TABLET    Take 600 mg by mouth 2 (two) times daily.   HYDROXYZINE (ATARAX/VISTARIL) 25 MG TABLET    Take 25 mg by mouth 3 (three) times daily.    INSULIN GLARGINE (TOUJEO SOLOSTAR) 300 UNIT/ML SOPN    Inject 40 Units into the skin at bedtime.    INSULIN LISPRO (HUMALOG) 100 UNIT/ML INJECTION    Inject 35 Units into the skin 3 (three) times daily before meals.    IPRATROPIUM-ALBUTEROL (DUONEB) 0.5-2.5 (3) MG/3ML SOLN    Take 3 mLs by nebulization every 6 (six) hours as needed (sob).    METHOTREXATE (RHEUMATREX) 10 MG TABLET    Take 10 mg by mouth every Wednesday. Caution: Chemotherapy. Protect from light.   METOPROLOL TARTRATE (LOPRESSOR) 25 MG TABLET    Take 12.5 mg by mouth 2 (two) times daily.    MULTIPLE VITAMINS-MINERALS (DECUBI-VITE) CAPS    Take 1 capsule by mouth daily at 3 pm.   NITROGLYCERIN (NITROSTAT) 0.4 MG SL TABLET    Place 0.4 mg under the tongue every 5 (five) minutes as needed for chest pain.   NYSTATIN POWD    Apply 1 application transdermally two times a day for rash.  Apply to reddened areas under right side folds   OMEPRAZOLE (PRILOSEC) 20 MG CAPSULE    Take 20 mg by mouth daily.   OXYGEN    Inhale into the lungs. O2 at 2l/min via nasal canula as needed for shortness of breath   POTASSIUM CHLORIDE (KLOR-CON) 20 MEQ PACKET    Take 40 mEq by mouth daily. 0800   PREDNISONE (DELTASONE) 10 MG TABLET    Take 10 mg by mouth daily.   ROPINIROLE (REQUIP) 0.5 MG TABLET    Take 0.5 mg by mouth at  bedtime. 2100   SENNOSIDES-DOCUSATE SODIUM (SENOKOT-S) 8.6-50 MG TABLET    Take 2 tablets by mouth at bedtime. 2100   SKIN PROTECTANTS, MISC. (CALAZIME SKIN PROTECTANT EX)    Apply topically. Apply to Sacral area daily as needed for redness   TORSEMIDE (DEMADEX) 20 MG TABLET    Take 20 mg by mouth 2 (two) times daily.   TRAMADOL-ACETAMINOPHEN (ULTRACET)  37.5-325 MG TABLET    Take 1-2 tablets by mouth every 6 (six) hours as needed for moderate pain.   TRAZODONE (DESYREL) 50 MG TABLET    Take 50 mg by mouth at bedtime.   Modified Medications   No medications on file  Discontinued Medications   No medications on file    Review of Systems  Respiratory: Positive for cough.   Gastrointestinal: Positive for diarrhea.  Genitourinary: Positive for difficulty urinating.  Musculoskeletal: Positive for arthralgias.  Skin: Positive for rash and wound.  Neurological: Positive for numbness.  All other systems reviewed and are negative.   Vitals:   04/15/17 1145  BP: (!) 142/76  Pulse: 80  Resp: 17  Temp: 97.6 F (36.4 C)  SpO2: 97%  Weight: (!) 348 lb 3.2 oz (157.9 kg)  Height: 6\' 1"  (1.854 m)   Body mass index is 45.94 kg/m.  Physical Exam  Constitutional: He is oriented to person, place, and time. He appears well-developed and well-nourished.  Sitting up in bed in NAD; no conversational dyspnea  HENT:  Mouth/Throat: Oropharynx is clear and moist.  MM dry; no oral thrush  Eyes: Pupils are equal, round, and reactive to light. No scleral icterus.  Neck: Neck supple. Carotid bruit is not present. No thyromegaly present.  Cardiovascular: Normal rate, regular rhythm, normal heart sounds and intact distal pulses.  Exam reveals no gallop and no friction rub.   No murmur heard. +1 pitting LE edema; no calf TTP  Pulmonary/Chest: Effort normal and breath sounds normal. He has no wheezes. He has no rales. He exhibits no tenderness.  Abdominal: Soft. Normal appearance and bowel sounds are normal. He exhibits no distension, no abdominal bruit, no pulsatile midline mass and no mass. There is no hepatomegaly. There is no tenderness. There is no rigidity, no rebound and no guarding. No hernia.  Genitourinary:  Genitourinary Comments: Foley DTG clear yellow urine  Musculoskeletal: He exhibits edema.  Right wrist brace intact  Lymphadenopathy:     He has no cervical adenopathy.  Neurological: He is alert and oriented to person, place, and time.  Skin: Skin is warm and dry. Rash (in folds of skin patrchy red  rash with no vesicles or secondary signs of infection; right medial buttock;) noted.  Stage 2 left medial buttock wound measures 7.2cm x 1.1 cm x 0.1 cm; followed by facility wound care  Psychiatric: He has a normal mood and affect. His behavior is normal. Judgment and thought content normal.     Labs reviewed: Nursing Home on 03/27/2017  Component Date Value Ref Range Status  . Microalb, Ur 03/28/2017 14.9   Final  Abstract on 03/11/2017  Component Date Value Ref Range Status  . Glucose 01/29/2017 418   Final  . BUN 01/29/2017 29* 4 - 21 Final  . Creatinine 01/29/2017 1.0  0.6 - 1.3 Final  . Potassium 01/29/2017 4.1  3.4 - 5.3 Final  . Sodium 01/29/2017 136* 137 - 147 Final  . ALT 01/29/2017 13  10 - 40 Final  . Hemoglobin A1C 01/29/2017 9.1   Final  Admission on  03/05/2017, Discharged on 03/05/2017  Component Date Value Ref Range Status  . Color, Urine 03/05/2017 YELLOW  YELLOW Final  . APPearance 03/05/2017 HAZY* CLEAR Final  . Specific Gravity, Urine 03/05/2017 1.009  1.005 - 1.030 Final  . pH 03/05/2017 6.0  5.0 - 8.0 Final  . Glucose, UA 03/05/2017 >=500* NEGATIVE mg/dL Final  . Hgb urine dipstick 03/05/2017 SMALL* NEGATIVE Final  . Bilirubin Urine 03/05/2017 NEGATIVE  NEGATIVE Final  . Ketones, ur 03/05/2017 NEGATIVE  NEGATIVE mg/dL Final  . Protein, ur 03/05/2017 100* NEGATIVE mg/dL Final  . Nitrite 03/05/2017 NEGATIVE  NEGATIVE Final  . Leukocytes, UA 03/05/2017 LARGE* NEGATIVE Final  . RBC / HPF 03/05/2017 0-5  0 - 5 RBC/hpf Final  . WBC, UA 03/05/2017 TOO NUMEROUS TO COUNT  0 - 5 WBC/hpf Final  . Bacteria, UA 03/05/2017 MANY* NONE SEEN Final  . Squamous Epithelial / LPF 03/05/2017 0-5* NONE SEEN Final  . WBC Clumps 03/05/2017 PRESENT   Final  . Mucous 03/05/2017 PRESENT   Final  . Specimen Description  03/05/2017 URINE, CATHETERIZED   Final  . Special Requests 03/05/2017 NONE   Final  . Culture 03/05/2017 60,000 COLONIES/mL ESCHERICHIA COLI*  Final  . Report Status 03/05/2017 03/08/2017 FINAL   Final  . Organism ID, Bacteria 03/05/2017 ESCHERICHIA COLI*  Final  Abstract on 02/15/2017  Component Date Value Ref Range Status  . Glucose 02/12/2017 206   Final  . BUN 02/12/2017 23* 4 - 21 Final  . Creatinine 02/12/2017 1.1  0.6 - 1.3 Final  . Potassium 02/12/2017 3.3* 3.4 - 5.3 Final  . Sodium 02/12/2017 138  137 - 147 Final  Abstract on 01/30/2017  Component Date Value Ref Range Status  . Hemoglobin 01/29/2017 12.6* 13.5 - 17.5 g/dL Final  . HCT 01/29/2017 38* 41 - 53 % Final  . Neutrophils Absolute 01/29/2017 12  /L Final  . Platelets 01/29/2017 255  150 - 399 K/L Final  . WBC 01/29/2017 15.0  10^3/mL Final  . Glucose 01/29/2017 418  mg/dL Final  . BUN 01/29/2017 29* 4 - 21 mg/dL Final  . Creatinine 01/29/2017 1.0  0.6 - 1.3 mg/dL Final  . Potassium 01/29/2017 4.1  3.4 - 5.3 mmol/L Final  . Sodium 01/29/2017 136* 137 - 147 mmol/L Final  . Alkaline Phosphatase 01/29/2017 90  25 - 125 U/L Final  . ALT 01/29/2017 19  10 - 40 U/L Final  . AST 01/29/2017 9* 14 - 40 U/L Final  . Bilirubin, Total 01/29/2017 0.2  mg/dL Final   <    No results found.   Assessment/Plan   ICD-10-CM   1. Cough probably 2/2 allergic rhinitis but r/o infectious cause R05   2. Uncontrolled type II diabetes with peripheral autonomic neuropathy (HCC) E11.43    E11.65   3. Chronic diastolic CHF (congestive heart failure) ([facility]) I50.32   4. Chronic indwelling Foley catheter Z92.89   5. Dyslipidemia associated with type 2 diabetes mellitus ([place]) - not at goal; had reaction to lipitor; currently on zetia E11.69    E78.5   6. High risk medication use Z79.899   7. Bullous pemphigus L10.9     Check CXR 2 view. If neg, t/c addition of antihistamine daily  Check lipid panel, A1c and CMP on 05/06/17  Cont  current meds as ordered  PT/OT as indicated  Wound care as ordered  F/u with specialists as indicated  Will follow  Jerik Falletta S. Eulas Post, D. O., F. [place]  [facility] [address] [place], [place] [address] [phone] Cell (Monday-Friday 8 AM - 5 PM) [phone] After 5 PM and follow prompts

## 2017-04-16 DIAGNOSIS — L89322 Pressure ulcer of left buttock, stage 2: Secondary | ICD-10-CM | POA: Diagnosis not present

## 2017-04-22 DIAGNOSIS — L89323 Pressure ulcer of left buttock, stage 3: Secondary | ICD-10-CM | POA: Diagnosis not present

## 2017-04-23 ENCOUNTER — Encounter: Payer: Self-pay | Admitting: Adult Health

## 2017-04-23 DIAGNOSIS — L603 Nail dystrophy: Secondary | ICD-10-CM | POA: Diagnosis not present

## 2017-04-23 DIAGNOSIS — R262 Difficulty in walking, not elsewhere classified: Secondary | ICD-10-CM | POA: Diagnosis not present

## 2017-04-23 DIAGNOSIS — B351 Tinea unguium: Secondary | ICD-10-CM | POA: Diagnosis not present

## 2017-04-23 DIAGNOSIS — E1140 Type 2 diabetes mellitus with diabetic neuropathy, unspecified: Secondary | ICD-10-CM | POA: Diagnosis not present

## 2017-04-23 NOTE — Progress Notes (Signed)
Entered in error

## 2017-04-30 DIAGNOSIS — L98419 Non-pressure chronic ulcer of buttock with unspecified severity: Secondary | ICD-10-CM | POA: Diagnosis not present

## 2017-05-01 DIAGNOSIS — Z7952 Long term (current) use of systemic steroids: Secondary | ICD-10-CM | POA: Diagnosis not present

## 2017-05-01 DIAGNOSIS — Z794 Long term (current) use of insulin: Secondary | ICD-10-CM | POA: Diagnosis not present

## 2017-05-01 DIAGNOSIS — E119 Type 2 diabetes mellitus without complications: Secondary | ICD-10-CM | POA: Diagnosis not present

## 2017-05-01 DIAGNOSIS — H2513 Age-related nuclear cataract, bilateral: Secondary | ICD-10-CM | POA: Diagnosis not present

## 2017-05-07 DIAGNOSIS — L98419 Non-pressure chronic ulcer of buttock with unspecified severity: Secondary | ICD-10-CM | POA: Diagnosis not present

## 2017-05-07 DIAGNOSIS — E785 Hyperlipidemia, unspecified: Secondary | ICD-10-CM | POA: Diagnosis not present

## 2017-05-07 DIAGNOSIS — E1169 Type 2 diabetes mellitus with other specified complication: Secondary | ICD-10-CM | POA: Diagnosis not present

## 2017-05-07 LAB — LIPID PANEL
CHOLESTEROL: 190 (ref 0–200)
HDL: 38 (ref 35–70)
LDL Cholesterol: 92
TRIGLYCERIDES: 299 — AB (ref 40–160)

## 2017-05-08 LAB — HEMOGLOBIN A1C: Hemoglobin A1C: 9.8

## 2017-05-14 DIAGNOSIS — L98419 Non-pressure chronic ulcer of buttock with unspecified severity: Secondary | ICD-10-CM | POA: Diagnosis not present

## 2017-05-17 ENCOUNTER — Encounter: Payer: Self-pay | Admitting: Adult Health

## 2017-05-17 NOTE — Progress Notes (Deleted)
Location:   [place] Room Number: 216 B Place of Service:  SNF (31)   CODE STATUS: Full Code  Allergies  Allergen Reactions  . Ace Inhibitors Swelling    Pt tolerates lisinopril  . Lipitor [Atorvastatin Calcium] Swelling  . Metformin And Related Swelling  . Cefadroxil Hives  . Cephalexin Hives  . Morphine And Related Other (See Comments)    Sweating, feels like is "in rocky boat."  . Robaxin [Methocarbamol] Other (See Comments)    Feels like he is shaky    Chief Complaint  Patient presents with  . Medical Management of Chronic Issues    DM follow up    HPI:    Past Medical History:  Diagnosis Date  . Arthritis    "hands, ankles, knees" (11/10/2014)  . Bladder spasms   . CAD (coronary artery disease)   . CAD in native artery 09/15/2015  . CHF (congestive heart failure) ([place])   . Chronic indwelling Foley catheter   . Chronic lower back pain   . Depression   . Diabetic diarrhea ([place])   . Diabetic neuropathy, painful ([place])   . DVT (deep venous thrombosis) ([facility]) 1980's   LLE  . GERD (gastroesophageal reflux disease)   . Hyperlipidemia   . Hypertension   . Neurogenic bladder   . Obese   . OSA (obstructive sleep apnea)    "they wanted me to wear a mask; I couldn't" (11/10/2014)  . Pneumonia 1999  . PONV (postoperative nausea and vomiting)   . Stroke [facility]) 10/2011; 06/2014   right hand numbness/notes 10/20/2011, pt not sure he had stroke in 2013; "right hand weaker and right face not quite right since" (11/10/2014)  . Type II diabetes mellitus ([place])   . Uncontrolled type II diabetes with peripheral autonomic neuropathy ([place]) 05/09/2015    Past Surgical History:  Procedure Laterality Date  . CARDIAC CATHETERIZATION N/A 05/02/2015   Procedure: Left Heart Cath and Coronary Angiography;  Surgeon: Lorretta Harp, MD;  Location: [place] CV LAB;  Service: Cardiovascular;  Laterality: N/A;  . CARDIAC CATHETERIZATION N/A 05/04/2015   Procedure: Left Heart Cath  and Coronary Angiography;  Surgeon: Wellington Hampshire, MD;  Location: [place] CV LAB;  Service: Cardiovascular;  Laterality: N/A;  . CARPAL TUNNEL RELEASE Bilateral ~ 2003-2004  . CHOLECYSTECTOMY OPEN  1970's?  Marland Kitchen GASTROPLASTY    . JOINT REPLACEMENT    . LEFT HEART CATHETERIZATION WITH CORONARY ANGIOGRAM N/A 10/24/2011   Procedure: LEFT HEART CATHETERIZATION WITH CORONARY ANGIOGRAM;  Surgeon: Candee Furbish, MD;  Location: [facility] CATH LAB;  Service: Cardiovascular;  Laterality: N/A;  right radial artery approach  . TOTAL KNEE ARTHROPLASTY Right 1990's    Social History   Social History  . Marital status: Widowed    Spouse name: N/A  . Number of children: N/A  . Years of education: N/A   Occupational History  . Retired    Social History Main Topics  . Smoking status: Former Smoker    Packs/day: 2.00    Years: 20.00    Types: Cigarettes    Quit date: 09/10/1976  . Smokeless tobacco: Never Used  . Alcohol use No     Comment: FORMER ALCOHOLIC; "sober since 11/94/1740"  . Drug use: No  . Sexual activity: No   Other Topics Concern  . Not on file   Social History Narrative   Lives alone.  No close family nearby.     Family History  Problem Relation Age of Onset  .  Diabetes type I Father   . Hypertension Mother   . Cancer Brother         Testicular Cancer  . Cancer Brother        Leukemia      VITAL SIGNS BP 113/66   Pulse 78   Ht 6\' 1"  (1.854 m)   Wt (!) 348 lb 3.2 oz (157.9 kg)   BMI 45.94 kg/m   Patient's Medications  New Prescriptions   No medications on file  Previous Medications   ALUMINUM-MAGNESIUM HYDROXIDE-SIMETHICONE (MAALOX) 149-702-63 MG/5ML SUSP    Take 30 mLs by mouth every 6 (six) hours as needed.   AMLODIPINE (NORVASC) 5 MG TABLET    Take 5 mg by mouth daily.   ASPIRIN EC 81 MG TABLET    Take 81 mg by mouth daily. 0900   CLOTRIMAZOLE 1 % OINT    Apply to trunk topically every day and night shift for itching   ESCITALOPRAM (LEXAPRO) 10 MG TABLET     Take 10 mg by mouth daily.    EZETIMIBE (ZETIA) 10 MG TABLET    Take 10 mg by mouth daily.   FOLIC ACID (FOLVITE) 1 MG TABLET    Take 1 mg by mouth daily.   GABAPENTIN (NEURONTIN) 100 MG CAPSULE    Take 1 capsule (100 mg total) by mouth 2 (two) times daily.   GUAIFENESIN (MUCINEX) 600 MG 12 HR TABLET    Take 600 mg by mouth 2 (two) times daily.   HYDROXYZINE (ATARAX/VISTARIL) 25 MG TABLET    Take 25 mg by mouth 3 (three) times daily.    INSULIN GLARGINE (TOUJEO SOLOSTAR) 300 UNIT/ML SOPN    Inject 40 Units into the skin at bedtime.    INSULIN LISPRO (HUMALOG) 100 UNIT/ML INJECTION    Inject 35 Units into the skin 3 (three) times daily before meals.    IPRATROPIUM-ALBUTEROL (DUONEB) 0.5-2.5 (3) MG/3ML SOLN    Take 3 mLs by nebulization every 6 (six) hours as needed (sob).    METHOTREXATE (RHEUMATREX) 10 MG TABLET    Take 10 mg by mouth every Wednesday. Caution: Chemotherapy. Protect from light.   METOPROLOL TARTRATE (LOPRESSOR) 25 MG TABLET    Take 12.5 mg by mouth 2 (two) times daily.    MULTIPLE VITAMINS-MINERALS (DECUBI-VITE) CAPS    Take 1 capsule by mouth daily at 3 pm.   NITROGLYCERIN (NITROSTAT) 0.4 MG SL TABLET    Place 0.4 mg under the tongue every 5 (five) minutes as needed for chest pain.   NYSTATIN POWD    Apply 1 application transdermally two times a day for rash.  Apply to reddened areas under right side folds   OMEPRAZOLE (PRILOSEC) 20 MG CAPSULE    Take 20 mg by mouth daily.   OXYGEN    Inhale into the lungs. O2 at 2l/min via nasal canula as needed for shortness of breath   POTASSIUM CHLORIDE (KLOR-CON) 20 MEQ PACKET    Take 40 mEq by mouth daily. 0800   PREDNISONE (DELTASONE) 10 MG TABLET    Take 10 mg by mouth daily.   ROPINIROLE (REQUIP) 0.5 MG TABLET    Take 0.5 mg by mouth at bedtime. 2100   SENNOSIDES-DOCUSATE SODIUM (SENOKOT-S) 8.6-50 MG TABLET    Take 2 tablets by mouth at bedtime. 2100   TORSEMIDE (DEMADEX) 20 MG TABLET    Take 20 mg by mouth 2 (two) times daily.    TRAMADOL-ACETAMINOPHEN (ULTRACET) 37.5-325 MG TABLET    Take 1-2 tablets  by mouth every 6 (six) hours as needed for moderate pain.   TRAZODONE (DESYREL) 50 MG TABLET    Take 50 mg by mouth at bedtime.   Modified Medications   No medications on file  Discontinued Medications   SKIN PROTECTANTS, MISC. (CALAZIME SKIN PROTECTANT EX)    Apply topically. Apply to Sacral area daily as needed for redness     SIGNIFICANT DIAGNOSTIC EXAMS  PREVIOUS  10-12-15: chest x-ray: Enlarged cardiac silhouette. Pulmonary vascular congestion.  10-18-15: EEG: This is a normal EEG for the patients stated age.  There were no focal, hemispheric or lateralizing features.  No epileptiform activity was recorded.  A normal EEG does not exclude the diagnosis of a seizure disorder and if seizure remains high on the list of differential diagnosis, an ambulatory EEG may be of value.  Clinical correlation is required.  01-11-16: chest x-ray: Minimal left base atelectasis.  Otherwise, normal exam.  01-11-16: ct of head: No acute intracranial abnormality. Stable mild cerebral atrophy and chronic white matter disease. No definite acute cortical infarction.  01-11-16: scrotal ultrasound: 1. Left epididymo-orchitis. 2. Microlithiasis involving the left testicle. 3. Three adjacent complex cysts/spermatoceles involving the left epididymal head.  NO NEW EXAMS  LABS REVIEWED: PREVIOUS     05-27-16: hgb a1c 11.7 07-20-16: stool for c-diff: +  07-30-16: wbc 15.1; hgb 12.4; hct 37.7; mcv 93.5; plt 289; glucose 322; bun 28.3; creat 0.91; k+ 4.3; na++ 137; liver normal albumin 3.4  12-13-16: urine micro-albumin 54.44 01-29-17: wbc 15.0; hgb 12.6; hct 38.1; mcv 93.0; plt 255; glucose 418; bun 29.1 creat 1.03; k+ 4.1; n++ 136; ca 8.6; liver normal albumin 3.3; hgb a1c 9.1   NO NEW LABS  Review of Systems  Constitutional: Positive for fever and malaise/fatigue.  Respiratory: Negative for cough and shortness of breath.   Cardiovascular:  Negative for chest pain, palpitations and leg swelling.  Gastrointestinal: Negative for abdominal pain, constipation and heartburn.  Genitourinary: Positive for dysuria.       Has bladder pain   Musculoskeletal: Negative for back pain, joint pain and myalgias.  Skin: Negative.   Neurological: Negative for dizziness.  Psychiatric/Behavioral: The patient is not nervous/anxious.     Physical Exam  Constitutional: He is oriented to person, place, and time. No distress.  Eyes: Conjunctivae are normal.  Neck: Neck supple. No JVD present. No thyromegaly present.  Cardiovascular: Normal rate, regular rhythm and intact distal pulses.   Respiratory: Effort normal and breath sounds normal. No respiratory distress. He has no wheezes.  GI: Soft. Bowel sounds are normal. He exhibits no distension. There is tenderness.  Has suprapubic pain   Genitourinary:  Genitourinary Comments: Has foley  Has penile tear   Musculoskeletal: He exhibits no edema.  Able to move all extremities   Lymphadenopathy:    He has no cervical adenopathy.  Neurological: He is alert and oriented to person, place, and time.  Skin: Skin is warm and dry. He is not diaphoretic.  Rash is improving   Psychiatric: He has a normal mood and affect.    ASSESSMENT/ PLAN:  1. UTI: will get a UA/C&S and will treat as indicated based upon culture results and will continue to monitor his status.    MD is aware of resident's narcotic use and is in agreement with current plan of care. We will attempt to wean resident as apropriate     Ok Edwards NP [facility]  Contact [phone] Monday through Friday 8am- 5pm  After hours  call [phone]

## 2017-05-17 NOTE — Progress Notes (Signed)
Entered in error

## 2017-05-20 ENCOUNTER — Encounter: Payer: Self-pay | Admitting: Adult Health

## 2017-05-20 ENCOUNTER — Non-Acute Institutional Stay (SKILLED_NURSING_FACILITY): Payer: Medicare Other | Admitting: Adult Health

## 2017-05-20 DIAGNOSIS — I5032 Chronic diastolic (congestive) heart failure: Secondary | ICD-10-CM

## 2017-05-20 DIAGNOSIS — K219 Gastro-esophageal reflux disease without esophagitis: Secondary | ICD-10-CM | POA: Diagnosis not present

## 2017-05-20 DIAGNOSIS — E1143 Type 2 diabetes mellitus with diabetic autonomic (poly)neuropathy: Secondary | ICD-10-CM

## 2017-05-20 DIAGNOSIS — I110 Hypertensive heart disease with heart failure: Secondary | ICD-10-CM

## 2017-05-20 DIAGNOSIS — IMO0002 Reserved for concepts with insufficient information to code with codable children: Secondary | ICD-10-CM

## 2017-05-20 DIAGNOSIS — L109 Pemphigus, unspecified: Secondary | ICD-10-CM | POA: Diagnosis not present

## 2017-05-20 DIAGNOSIS — E1165 Type 2 diabetes mellitus with hyperglycemia: Secondary | ICD-10-CM

## 2017-05-20 NOTE — Progress Notes (Signed)
Location:   [place] Room Number: 216 B Place of Service:   snf   CODE STATUS: Full Code  Allergies  Allergen Reactions  . Ace Inhibitors Swelling    Pt tolerates lisinopril  . Lipitor [Atorvastatin Calcium] Swelling  . Metformin And Related Swelling  . Cefadroxil Hives  . Cephalexin Hives  . Morphine And Related Other (See Comments)    Sweating, feels like is "in rocky boat."  . Robaxin [Methocarbamol] Other (See Comments)    Feels like he is shaky    Chief Complaint  Patient presents with  . Medical Management of Chronic Issues    Bullous pemphigoid; hypertension; gerd; heart failure and diabetes.     HPI:  He is a 71 year old long term resident of this facility being seen for the management of his chronic illnesses: bullous pemphigoid; hypertension; gerd; heart failure; and diabetes. His cbgs remain elevated up to over 400. He will decline insulin at times and will not allow for cbg checks. We have discussed his increased readings; he does not want his medications increased at this time.  He does not complain of chest pain; shortness of breath or swelling. There are no nursing concerns at this time.     Past Medical History:  Diagnosis Date  . Arthritis    "hands, ankles, knees" (11/10/2014)  . Bladder spasms   . CAD (coronary artery disease)   . CAD in native artery 09/15/2015  . CHF (congestive heart failure) ([place])   . Chronic indwelling Foley catheter   . Chronic lower back pain   . Depression   . Diabetic diarrhea ([place])   . Diabetic neuropathy, painful ([place])   . DVT (deep venous thrombosis) ([facility]) 1980's   LLE  . GERD (gastroesophageal reflux disease)   . Hyperlipidemia   . Hypertension   . Neurogenic bladder   . Obese   . OSA (obstructive sleep apnea)    "they wanted me to wear a mask; I couldn't" (11/10/2014)  . Pneumonia 1999  . PONV (postoperative nausea and vomiting)   . Stroke [facility]) 10/2011; 06/2014   right hand numbness/notes 10/20/2011,  pt not sure he had stroke in 2013; "right hand weaker and right face not quite right since" (11/10/2014)  . Type II diabetes mellitus ([place])   . Uncontrolled type II diabetes with peripheral autonomic neuropathy ([place]) 05/09/2015    Past Surgical History:  Procedure Laterality Date  . CARDIAC CATHETERIZATION N/A 05/02/2015   Procedure: Left Heart Cath and Coronary Angiography;  Surgeon: Lorretta Harp, MD;  Location: [place] CV LAB;  Service: Cardiovascular;  Laterality: N/A;  . CARDIAC CATHETERIZATION N/A 05/04/2015   Procedure: Left Heart Cath and Coronary Angiography;  Surgeon: Wellington Hampshire, MD;  Location: [place] CV LAB;  Service: Cardiovascular;  Laterality: N/A;  . CARPAL TUNNEL RELEASE Bilateral ~ 2003-2004  . CHOLECYSTECTOMY OPEN  1970's?  Marland Kitchen GASTROPLASTY    . JOINT REPLACEMENT    . LEFT HEART CATHETERIZATION WITH CORONARY ANGIOGRAM N/A 10/24/2011   Procedure: LEFT HEART CATHETERIZATION WITH CORONARY ANGIOGRAM;  Surgeon: Candee Furbish, MD;  Location: [facility] CATH LAB;  Service: Cardiovascular;  Laterality: N/A;  right radial artery approach  . TOTAL KNEE ARTHROPLASTY Right 1990's    Social History   Social History  . Marital status: Widowed    Spouse name: N/A  . Number of children: N/A  . Years of education: N/A   Occupational History  . Retired    Social History Main  Topics  . Smoking status: Former Smoker    Packs/day: 2.00    Years: 20.00    Types: Cigarettes    Quit date: 09/10/1976  . Smokeless tobacco: Never Used  . Alcohol use No     Comment: FORMER ALCOHOLIC; "sober since 78/24/2353"  . Drug use: No  . Sexual activity: No   Other Topics Concern  . Not on file   Social History Narrative   Lives alone.  No close family nearby.     Family History  Problem Relation Age of Onset  . Diabetes type I Father   . Hypertension Mother   . Cancer Brother         Testicular Cancer  . Cancer Brother        Leukemia      VITAL SIGNS BP 113/66   Pulse 78    Temp 97.6 F (36.4 C)   Resp 18   Ht 6\' 1"  (1.854 m)   Wt (!) 348 lb 3.2 oz (157.9 kg)   SpO2 98%   BMI 45.94 kg/m   Patient's Medications  New Prescriptions   No medications on file  Previous Medications   ALUMINUM-MAGNESIUM HYDROXIDE-SIMETHICONE (MAALOX) 614-431-54 MG/5ML SUSP    Take 30 mLs by mouth every 6 (six) hours as needed.   AMLODIPINE (NORVASC) 5 MG TABLET    Take 5 mg by mouth daily.   ASPIRIN EC 81 MG TABLET    Take 81 mg by mouth daily. 0900   CLOTRIMAZOLE 1 % OINT    Apply to trunk topically every day and night shift for itching   ESCITALOPRAM (LEXAPRO) 10 MG TABLET    Take 10 mg by mouth daily.    EZETIMIBE (ZETIA) 10 MG TABLET    Take 10 mg by mouth daily.   FOLIC ACID (FOLVITE) 1 MG TABLET    Take 1 mg by mouth daily.   GABAPENTIN (NEURONTIN) 100 MG CAPSULE    Take 1 capsule (100 mg total) by mouth 2 (two) times daily.   GUAIFENESIN (MUCINEX) 600 MG 12 HR TABLET    Take 600 mg by mouth 2 (two) times daily.   HYDROXYZINE (ATARAX/VISTARIL) 25 MG TABLET    Take 25 mg by mouth 3 (three) times daily.    INSULIN GLARGINE (TOUJEO SOLOSTAR) 300 UNIT/ML SOPN    Inject 40 Units into the skin at bedtime.    INSULIN LISPRO (HUMALOG) 100 UNIT/ML INJECTION    Inject 35 Units into the skin 3 (three) times daily before meals.    IPRATROPIUM-ALBUTEROL (DUONEB) 0.5-2.5 (3) MG/3ML SOLN    Take 3 mLs by nebulization every 6 (six) hours as needed (sob).    METHOTREXATE (RHEUMATREX) 10 MG TABLET    Take 10 mg by mouth every Wednesday. Caution: Chemotherapy. Protect from light.   METOPROLOL TARTRATE (LOPRESSOR) 25 MG TABLET    Take 12.5 mg by mouth 2 (two) times daily.    MULTIPLE VITAMINS-MINERALS (DECUBI-VITE) CAPS    Take 1 capsule by mouth daily at 3 pm.   NITROGLYCERIN (NITROSTAT) 0.4 MG SL TABLET    Place 0.4 mg under the tongue every 5 (five) minutes as needed for chest pain.   NYSTATIN POWD    Apply 1 application transdermally two times a day for rash.  Apply to reddened areas under  right side folds   OMEPRAZOLE (PRILOSEC) 20 MG CAPSULE    Take 20 mg by mouth daily.   OXYGEN    Inhale into the lungs. O2 at 2l/min  via nasal canula as needed for shortness of breath   POTASSIUM CHLORIDE (KLOR-CON) 20 MEQ PACKET    Take 40 mEq by mouth daily. 0800   PREDNISONE (DELTASONE) 10 MG TABLET    Take 10 mg by mouth daily.   ROPINIROLE (REQUIP) 0.5 MG TABLET    Take 0.5 mg by mouth at bedtime. 2100   SENNOSIDES-DOCUSATE SODIUM (SENOKOT-S) 8.6-50 MG TABLET    Take 2 tablets by mouth at bedtime. 2100   TORSEMIDE (DEMADEX) 20 MG TABLET    Take 20 mg by mouth 2 (two) times daily.   TRAMADOL-ACETAMINOPHEN (ULTRACET) 37.5-325 MG TABLET    Take 1-2 tablets by mouth every 6 (six) hours as needed for moderate pain.   TRAZODONE (DESYREL) 50 MG TABLET    Take 50 mg by mouth at bedtime.   Modified Medications   No medications on file  Discontinued Medications   No medications on file     SIGNIFICANT DIAGNOSTIC EXAMS  PREVIOUS  10-12-15: chest x-ray: Enlarged cardiac silhouette. Pulmonary vascular congestion.  10-18-15: EEG: This is a normal EEG for the patients stated age.  There were no focal, hemispheric or lateralizing features.  No epileptiform activity was recorded.  A normal EEG does not exclude the diagnosis of a seizure disorder and if seizure remains high on the list of differential diagnosis, an ambulatory EEG may be of value.  Clinical correlation is required.  01-11-16: chest x-ray: Minimal left base atelectasis.  Otherwise, normal exam.  01-11-16: ct of head: No acute intracranial abnormality. Stable mild cerebral atrophy and chronic white matter disease. No definite acute cortical infarction.  01-11-16: scrotal ultrasound: 1. Left epididymo-orchitis. 2. Microlithiasis involving the left testicle. 3. Three adjacent complex cysts/spermatoceles involving the left epididymal head.  NO NEW EXAMS  LABS REVIEWED: PREVIOUS     05-27-16: hgb a1c 11.7 07-20-16: stool for c-diff: +    07-30-16: wbc 15.1; hgb 12.4; hct 37.7; mcv 93.5; plt 289; glucose 322; bun 28.3; creat 0.91; k+ 4.3; na++ 137; liver normal albumin 3.4  12-13-16: urine micro-albumin 54.44 01-29-17: wbc 15.0; hgb 12.6; hct 38.1; mcv 93.0; plt 255; glucose 418; bun 29.1 creat 1.03; k+ 4.1; n++ 136; ca 8.6; liver normal albumin 3.3; hgb a1c 9.1   NEW LABS:   04-27-18: hgb a1c 9.8; chol 190; ldl 92; trig 299; hdl 38   Review of Systems  Constitutional: Negative for malaise/fatigue.  Respiratory: Negative for cough and shortness of breath.   Cardiovascular: Negative for chest pain, palpitations and leg swelling.  Gastrointestinal: Negative for abdominal pain, constipation and heartburn.  Genitourinary:       Has foley   Musculoskeletal: Negative for back pain, joint pain and myalgias.  Skin: Negative.   Neurological: Negative for dizziness.  Psychiatric/Behavioral: The patient is not nervous/anxious.   ' Physical Exam  Constitutional: He is oriented to person, place, and time. No distress.  Morbidly obese   Eyes: Conjunctivae are normal.  Neck: Neck supple. No JVD present. No thyromegaly present.  Cardiovascular: Normal rate, regular rhythm and intact distal pulses.   Respiratory: Effort normal and breath sounds normal. No respiratory distress. He has no wheezes.  GI: Soft. Bowel sounds are normal. He exhibits no distension. There is no tenderness.  Genitourinary:  Genitourinary Comments: Has foley   Musculoskeletal: He exhibits no edema.  Able to move all extremities   Lymphadenopathy:    He has no cervical adenopathy.  Neurological: He is alert and oriented to person, place, and time.  Skin:  Skin is warm and dry. He is not diaphoretic.  Rash is under control   Psychiatric: He has a normal mood and affect.   ASSESSMENT/ PLAN:  TODAY:   1. bullous pemphigoid:  He has declined to see dermatologist any further; will continue  atarax 25 mg three times daily   Will increase methotrexate to 10 mg  weekly. Will continue chronic prednisone 10 mg daily and will monitor  2. Hypertension: b/p 113/66  will continue norvasc 5 mg daily  Lopressor 12.5 mg twice daily asa 81 mg daily   3. Gerd: will continue  prilosec 40 mg daily   4.  Diastolic heart failure:  2-d done 05-28-16 (no EF) : will continue demadex 20 mg twice daily with k+ 40 meq daily  Will monitor  5. Diabetes: hgb a1c 9.1 (previous  11.7); will continue  humalog 35  units after meals and   tuojeo 40 units nightly . At this time he does not want his diabetes medications adjusted.   PREVIOUS   6.  OSA: is on cpap;   7. Diabetic neuropathy: will continue neurontin 100 mg twice daily has ultracet 1 or 2 tabs every 6 hours as needed  8. Depression will continue lexapro 10 mg daily has trazodone 50 mg nightly    9.  History of CVA: is neurologically stable; will continue asa 81 mg daily   10.  Allergic rhinitis: will continue mucinex 600 mg twice daily duoneb four times daily as needed   11. RLS: will continue requip 0.5 mg nightly   12. CAD: no complaint of chest pain: will continue asa 81 mg daily and prn ntg  13. Constipation: will continue senna s 2 tabs nightly   13. Neurogenic bladder: has chronic foley   14. Dyslipidemia: will continue zetia 10 mg daily    MD is aware of resident's narcotic use and is in agreement with current plan of care. We will attempt to wean resident as apropriate     Ok Edwards NP [facility]  Contact [phone] Monday through Friday 8am- 5pm  After hours call [phone]

## 2017-05-21 DIAGNOSIS — L98419 Non-pressure chronic ulcer of buttock with unspecified severity: Secondary | ICD-10-CM | POA: Diagnosis not present

## 2017-05-27 ENCOUNTER — Encounter: Payer: Self-pay | Admitting: Adult Health

## 2017-05-27 ENCOUNTER — Encounter (HOSPITAL_COMMUNITY): Payer: Self-pay | Admitting: Family Medicine

## 2017-05-27 ENCOUNTER — Inpatient Hospital Stay (HOSPITAL_COMMUNITY): Payer: Medicare Other

## 2017-05-27 ENCOUNTER — Inpatient Hospital Stay (HOSPITAL_COMMUNITY)
Admission: EM | Admit: 2017-05-27 | Discharge: 2017-06-05 | DRG: 698 | Disposition: A | Discharge status: Skilled Nursing Facility | Payer: Medicare Other | Attending: Internal Medicine | Admitting: Internal Medicine

## 2017-05-27 ENCOUNTER — Non-Acute Institutional Stay (SKILLED_NURSING_FACILITY): Payer: Medicare Other | Admitting: Adult Health

## 2017-05-27 DIAGNOSIS — G4733 Obstructive sleep apnea (adult) (pediatric): Secondary | ICD-10-CM | POA: Diagnosis present

## 2017-05-27 DIAGNOSIS — Z7982 Long term (current) use of aspirin: Secondary | ICD-10-CM

## 2017-05-27 DIAGNOSIS — Y846 Urinary catheterization as the cause of abnormal reaction of the patient, or of later complication, without mention of misadventure at the time of the procedure: Secondary | ICD-10-CM | POA: Diagnosis present

## 2017-05-27 DIAGNOSIS — E785 Hyperlipidemia, unspecified: Secondary | ICD-10-CM | POA: Diagnosis present

## 2017-05-27 DIAGNOSIS — E1169 Type 2 diabetes mellitus with other specified complication: Secondary | ICD-10-CM | POA: Diagnosis not present

## 2017-05-27 DIAGNOSIS — Z9289 Personal history of other medical treatment: Secondary | ICD-10-CM | POA: Diagnosis not present

## 2017-05-27 DIAGNOSIS — E1165 Type 2 diabetes mellitus with hyperglycemia: Secondary | ICD-10-CM | POA: Diagnosis present

## 2017-05-27 DIAGNOSIS — R55 Syncope and collapse: Secondary | ICD-10-CM | POA: Diagnosis not present

## 2017-05-27 DIAGNOSIS — D179 Benign lipomatous neoplasm, unspecified: Secondary | ICD-10-CM | POA: Diagnosis not present

## 2017-05-27 DIAGNOSIS — I472 Ventricular tachycardia: Secondary | ICD-10-CM | POA: Diagnosis not present

## 2017-05-27 DIAGNOSIS — Z6841 Body Mass Index (BMI) 40.0 and over, adult: Secondary | ICD-10-CM | POA: Diagnosis not present

## 2017-05-27 DIAGNOSIS — R1311 Dysphagia, oral phase: Secondary | ICD-10-CM | POA: Diagnosis not present

## 2017-05-27 DIAGNOSIS — E876 Hypokalemia: Secondary | ICD-10-CM | POA: Diagnosis present

## 2017-05-27 DIAGNOSIS — Z86718 Personal history of other venous thrombosis and embolism: Secondary | ICD-10-CM

## 2017-05-27 DIAGNOSIS — I82891 Chronic embolism and thrombosis of other specified veins: Secondary | ICD-10-CM | POA: Diagnosis not present

## 2017-05-27 DIAGNOSIS — Z79899 Other long term (current) drug therapy: Secondary | ICD-10-CM

## 2017-05-27 DIAGNOSIS — T83518A Infection and inflammatory reaction due to other urinary catheter, initial encounter: Principal | ICD-10-CM | POA: Diagnosis present

## 2017-05-27 DIAGNOSIS — I5033 Acute on chronic diastolic (congestive) heart failure: Secondary | ICD-10-CM | POA: Diagnosis not present

## 2017-05-27 DIAGNOSIS — T83511A Infection and inflammatory reaction due to indwelling urethral catheter, initial encounter: Secondary | ICD-10-CM

## 2017-05-27 DIAGNOSIS — Z87891 Personal history of nicotine dependence: Secondary | ICD-10-CM

## 2017-05-27 DIAGNOSIS — L120 Bullous pemphigoid: Secondary | ICD-10-CM | POA: Diagnosis not present

## 2017-05-27 DIAGNOSIS — Z8249 Family history of ischemic heart disease and other diseases of the circulatory system: Secondary | ICD-10-CM

## 2017-05-27 DIAGNOSIS — L03116 Cellulitis of left lower limb: Secondary | ICD-10-CM | POA: Diagnosis not present

## 2017-05-27 DIAGNOSIS — D638 Anemia in other chronic diseases classified elsewhere: Secondary | ICD-10-CM | POA: Diagnosis present

## 2017-05-27 DIAGNOSIS — I361 Nonrheumatic tricuspid (valve) insufficiency: Secondary | ICD-10-CM | POA: Diagnosis not present

## 2017-05-27 DIAGNOSIS — E08628 Diabetes mellitus due to underlying condition with other skin complications: Secondary | ICD-10-CM | POA: Diagnosis not present

## 2017-05-27 DIAGNOSIS — B9620 Unspecified Escherichia coli [E. coli] as the cause of diseases classified elsewhere: Secondary | ICD-10-CM | POA: Diagnosis present

## 2017-05-27 DIAGNOSIS — L1089 Other pemphigus: Secondary | ICD-10-CM | POA: Diagnosis present

## 2017-05-27 DIAGNOSIS — Z960 Presence of urogenital implants: Secondary | ICD-10-CM

## 2017-05-27 DIAGNOSIS — E669 Obesity, unspecified: Secondary | ICD-10-CM | POA: Diagnosis not present

## 2017-05-27 DIAGNOSIS — I2510 Atherosclerotic heart disease of native coronary artery without angina pectoris: Secondary | ICD-10-CM | POA: Diagnosis present

## 2017-05-27 DIAGNOSIS — M25512 Pain in left shoulder: Secondary | ICD-10-CM | POA: Diagnosis not present

## 2017-05-27 DIAGNOSIS — L03119 Cellulitis of unspecified part of limb: Secondary | ICD-10-CM | POA: Diagnosis not present

## 2017-05-27 DIAGNOSIS — R339 Retention of urine, unspecified: Secondary | ICD-10-CM | POA: Diagnosis present

## 2017-05-27 DIAGNOSIS — N319 Neuromuscular dysfunction of bladder, unspecified: Secondary | ICD-10-CM | POA: Diagnosis present

## 2017-05-27 DIAGNOSIS — R109 Unspecified abdominal pain: Secondary | ICD-10-CM | POA: Diagnosis present

## 2017-05-27 DIAGNOSIS — E1143 Type 2 diabetes mellitus with diabetic autonomic (poly)neuropathy: Secondary | ICD-10-CM | POA: Diagnosis not present

## 2017-05-27 DIAGNOSIS — Z888 Allergy status to other drugs, medicaments and biological substances status: Secondary | ICD-10-CM

## 2017-05-27 DIAGNOSIS — I110 Hypertensive heart disease with heart failure: Secondary | ICD-10-CM | POA: Diagnosis present

## 2017-05-27 DIAGNOSIS — Z8673 Personal history of transient ischemic attack (TIA), and cerebral infarction without residual deficits: Secondary | ICD-10-CM | POA: Diagnosis not present

## 2017-05-27 DIAGNOSIS — A419 Sepsis, unspecified organism: Secondary | ICD-10-CM | POA: Diagnosis not present

## 2017-05-27 DIAGNOSIS — N390 Urinary tract infection, site not specified: Secondary | ICD-10-CM

## 2017-05-27 DIAGNOSIS — N3000 Acute cystitis without hematuria: Secondary | ICD-10-CM | POA: Diagnosis present

## 2017-05-27 DIAGNOSIS — K219 Gastro-esophageal reflux disease without esophagitis: Secondary | ICD-10-CM | POA: Diagnosis present

## 2017-05-27 DIAGNOSIS — F339 Major depressive disorder, recurrent, unspecified: Secondary | ICD-10-CM | POA: Diagnosis not present

## 2017-05-27 DIAGNOSIS — Z833 Family history of diabetes mellitus: Secondary | ICD-10-CM

## 2017-05-27 DIAGNOSIS — IMO0002 Reserved for concepts with insufficient information to code with codable children: Secondary | ICD-10-CM | POA: Diagnosis present

## 2017-05-27 DIAGNOSIS — R1084 Generalized abdominal pain: Secondary | ICD-10-CM | POA: Diagnosis not present

## 2017-05-27 DIAGNOSIS — T83091A Other mechanical complication of indwelling urethral catheter, initial encounter: Secondary | ICD-10-CM | POA: Diagnosis not present

## 2017-05-27 DIAGNOSIS — Z794 Long term (current) use of insulin: Secondary | ICD-10-CM

## 2017-05-27 DIAGNOSIS — R6520 Severe sepsis without septic shock: Secondary | ICD-10-CM | POA: Diagnosis not present

## 2017-05-27 DIAGNOSIS — D649 Anemia, unspecified: Secondary | ICD-10-CM | POA: Diagnosis not present

## 2017-05-27 DIAGNOSIS — I5032 Chronic diastolic (congestive) heart failure: Secondary | ICD-10-CM | POA: Diagnosis not present

## 2017-05-27 DIAGNOSIS — Z885 Allergy status to narcotic agent status: Secondary | ICD-10-CM

## 2017-05-27 DIAGNOSIS — Z7952 Long term (current) use of systemic steroids: Secondary | ICD-10-CM

## 2017-05-27 DIAGNOSIS — M160 Bilateral primary osteoarthritis of hip: Secondary | ICD-10-CM | POA: Diagnosis not present

## 2017-05-27 DIAGNOSIS — Z96651 Presence of right artificial knee joint: Secondary | ICD-10-CM | POA: Diagnosis present

## 2017-05-27 DIAGNOSIS — F329 Major depressive disorder, single episode, unspecified: Secondary | ICD-10-CM | POA: Diagnosis present

## 2017-05-27 DIAGNOSIS — Z881 Allergy status to other antibiotic agents status: Secondary | ICD-10-CM

## 2017-05-27 DIAGNOSIS — S3091XA Unspecified superficial injury of lower back and pelvis, initial encounter: Secondary | ICD-10-CM | POA: Diagnosis present

## 2017-05-27 DIAGNOSIS — G4700 Insomnia, unspecified: Secondary | ICD-10-CM | POA: Diagnosis not present

## 2017-05-27 DIAGNOSIS — L109 Pemphigus, unspecified: Secondary | ICD-10-CM | POA: Diagnosis present

## 2017-05-27 DIAGNOSIS — I10 Essential (primary) hypertension: Secondary | ICD-10-CM | POA: Diagnosis not present

## 2017-05-27 DIAGNOSIS — Z978 Presence of other specified devices: Secondary | ICD-10-CM

## 2017-05-27 DIAGNOSIS — I5030 Unspecified diastolic (congestive) heart failure: Secondary | ICD-10-CM | POA: Diagnosis not present

## 2017-05-27 DIAGNOSIS — G2581 Restless legs syndrome: Secondary | ICD-10-CM | POA: Diagnosis not present

## 2017-05-27 DIAGNOSIS — I493 Ventricular premature depolarization: Secondary | ICD-10-CM | POA: Diagnosis not present

## 2017-05-27 DIAGNOSIS — M6281 Muscle weakness (generalized): Secondary | ICD-10-CM | POA: Diagnosis not present

## 2017-05-27 LAB — GLUCOSE, CAPILLARY: GLUCOSE-CAPILLARY: 227 mg/dL — AB (ref 65–99)

## 2017-05-27 LAB — URINALYSIS, ROUTINE W REFLEX MICROSCOPIC
Bilirubin Urine: NEGATIVE
GLUCOSE, UA: NEGATIVE mg/dL
Ketones, ur: NEGATIVE mg/dL
NITRITE: POSITIVE — AB
PH: 5 (ref 5.0–8.0)
Protein, ur: >=300 mg/dL — AB
SPECIFIC GRAVITY, URINE: 1.013 (ref 1.005–1.030)

## 2017-05-27 LAB — CBC
HCT: 35.8 % — ABNORMAL LOW (ref 39.0–52.0)
HEMOGLOBIN: 11.8 g/dL — AB (ref 13.0–17.0)
MCH: 29.7 pg (ref 26.0–34.0)
MCHC: 33 g/dL (ref 30.0–36.0)
MCV: 90.2 fL (ref 78.0–100.0)
PLATELETS: 271 10*3/uL (ref 150–400)
RBC: 3.97 MIL/uL — ABNORMAL LOW (ref 4.22–5.81)
RDW: 13.6 % (ref 11.5–15.5)
WBC: 17.5 10*3/uL — ABNORMAL HIGH (ref 4.0–10.5)

## 2017-05-27 LAB — BASIC METABOLIC PANEL
Anion gap: 9 (ref 5–15)
BUN: 16 mg/dL (ref 6–20)
CHLORIDE: 95 mmol/L — AB (ref 101–111)
CO2: 31 mmol/L (ref 22–32)
CREATININE: 0.95 mg/dL (ref 0.61–1.24)
Calcium: 8.4 mg/dL — ABNORMAL LOW (ref 8.9–10.3)
Glucose, Bld: 222 mg/dL — ABNORMAL HIGH (ref 65–99)
Potassium: 3.6 mmol/L (ref 3.5–5.1)
SODIUM: 135 mmol/L (ref 135–145)

## 2017-05-27 LAB — LACTIC ACID, PLASMA
LACTIC ACID, VENOUS: 1.3 mmol/L (ref 0.5–1.9)
Lactic Acid, Venous: 1.1 mmol/L (ref 0.5–1.9)

## 2017-05-27 LAB — PROCALCITONIN: Procalcitonin: 0.13 ng/mL

## 2017-05-27 LAB — SEDIMENTATION RATE: SED RATE: 48 mm/h — AB (ref 0–16)

## 2017-05-27 LAB — C-REACTIVE PROTEIN: CRP: 17.1 mg/dL — ABNORMAL HIGH (ref <1.0)

## 2017-05-27 LAB — PROTIME-INR
INR: 1.08
Prothrombin Time: 13.9 seconds (ref 11.4–15.2)

## 2017-05-27 LAB — LIPASE, BLOOD: Lipase: 15 U/L (ref 11–51)

## 2017-05-27 LAB — BRAIN NATRIURETIC PEPTIDE: B Natriuretic Peptide: 132.4 pg/mL — ABNORMAL HIGH (ref 0.0–100.0)

## 2017-05-27 MED ORDER — HYDRALAZINE HCL 20 MG/ML IJ SOLN: 5.0000 mg | INTRAMUSCULAR | Status: DC | PRN

## 2017-05-27 MED ORDER — ADULT MULTIVITAMIN W/MINERALS CH
1.0000 | ORAL_TABLET | Freq: Every day | ORAL | Status: DC
  Administered 2017-05-28 – 2017-06-04 (×7): 1 via ORAL
  Filled 2017-05-27 (×7): qty 1

## 2017-05-27 MED ORDER — EZETIMIBE 10 MG PO TABS
10.0000 mg | ORAL_TABLET | Freq: Every day | ORAL | Status: DC
  Administered 2017-05-28 – 2017-06-05 (×9): 10 mg via ORAL
  Filled 2017-05-27 (×9): qty 1

## 2017-05-27 MED ORDER — CLOTRIMAZOLE 1 % EX CREA
TOPICAL_CREAM | Freq: Two times a day (BID) | CUTANEOUS | Status: DC
  Administered 2017-05-27 – 2017-06-04 (×17): via TOPICAL
  Filled 2017-05-27 (×3): qty 15

## 2017-05-27 MED ORDER — NITROGLYCERIN 0.4 MG SL SUBL: 0.4000 mg | SUBLINGUAL_TABLET | SUBLINGUAL | Status: DC | PRN

## 2017-05-27 MED ORDER — DEXTROSE 5 % IV SOLN
1.0000 g | Freq: Once | INTRAVENOUS | Status: DC
Start: 2017-05-27 — End: 2017-05-27

## 2017-05-27 MED ORDER — ASPIRIN EC 81 MG PO TBEC
81.0000 mg | DELAYED_RELEASE_TABLET | Freq: Every day | ORAL | Status: DC
Start: 2017-05-28 — End: 2017-06-05
  Administered 2017-05-28 – 2017-06-05 (×9): 81 mg via ORAL
  Filled 2017-05-27 (×9): qty 1

## 2017-05-27 MED ORDER — VANCOMYCIN HCL 10 G IV SOLR
1250.0000 mg | Freq: Two times a day (BID) | INTRAVENOUS | Status: DC
  Administered 2017-05-28 – 2017-05-30 (×5): 1250 mg via INTRAVENOUS
  Filled 2017-05-27 (×6): qty 1250

## 2017-05-27 MED ORDER — ONDANSETRON HCL 4 MG/2ML IJ SOLN: 4.0000 mg | Freq: Three times a day (TID) | INTRAMUSCULAR | Status: DC | PRN

## 2017-05-27 MED ORDER — METOPROLOL TARTRATE 25 MG PO TABS
12.5000 mg | ORAL_TABLET | Freq: Two times a day (BID) | ORAL | Status: DC
  Administered 2017-05-27 – 2017-06-05 (×18): 12.5 mg via ORAL
  Filled 2017-05-27 (×18): qty 1

## 2017-05-27 MED ORDER — ZOLPIDEM TARTRATE 5 MG PO TABS
5.0000 mg | ORAL_TABLET | Freq: Every evening | ORAL | Status: DC | PRN
  Administered 2017-05-28 – 2017-06-04 (×3): 5 mg via ORAL
  Filled 2017-05-27 (×3): qty 1

## 2017-05-27 MED ORDER — METHOTREXATE 2.5 MG PO TABS: 10.0000 mg | ORAL_TABLET | ORAL | Status: DC

## 2017-05-27 MED ORDER — FOLIC ACID 1 MG PO TABS
1.0000 mg | ORAL_TABLET | Freq: Every day | ORAL | Status: DC
  Administered 2017-05-28 – 2017-06-05 (×9): 1 mg via ORAL
  Filled 2017-05-27 (×9): qty 1

## 2017-05-27 MED ORDER — GABAPENTIN 100 MG PO CAPS
100.0000 mg | ORAL_CAPSULE | Freq: Two times a day (BID) | ORAL | Status: DC
  Administered 2017-05-27 – 2017-06-05 (×18): 100 mg via ORAL
  Filled 2017-05-27 (×18): qty 1

## 2017-05-27 MED ORDER — TRAZODONE HCL 50 MG PO TABS
50.0000 mg | ORAL_TABLET | Freq: Every day | ORAL | Status: DC
  Administered 2017-05-27 – 2017-06-04 (×9): 50 mg via ORAL
  Filled 2017-05-27 (×9): qty 1

## 2017-05-27 MED ORDER — PANTOPRAZOLE SODIUM 40 MG PO TBEC
40.0000 mg | DELAYED_RELEASE_TABLET | Freq: Every day | ORAL | Status: DC
  Administered 2017-05-28 – 2017-06-05 (×9): 40 mg via ORAL
  Filled 2017-05-27 (×9): qty 1

## 2017-05-27 MED ORDER — ACETAMINOPHEN 650 MG RE SUPP: 650.0000 mg | Freq: Four times a day (QID) | RECTAL | Status: DC | PRN

## 2017-05-27 MED ORDER — SODIUM CHLORIDE 0.9 % IV BOLUS (SEPSIS)
1000.0000 mL | Freq: Once | INTRAVENOUS | Status: AC
  Administered 2017-05-27: 1000 mL via INTRAVENOUS

## 2017-05-27 MED ORDER — HYDROMORPHONE HCL-NACL 0.5-0.9 MG/ML-% IV SOSY: 1.0000 mg | PREFILLED_SYRINGE | INTRAVENOUS | Status: DC | PRN

## 2017-05-27 MED ORDER — MORPHINE SULFATE (PF) 2 MG/ML IV SOLN: 2.0000 mg | INTRAVENOUS | Status: DC | PRN

## 2017-05-27 MED ORDER — ORAL CARE MOUTH RINSE
15.0000 mL | Freq: Two times a day (BID) | OROMUCOSAL | Status: DC
  Administered 2017-05-29 – 2017-05-30 (×3): 15 mL via OROMUCOSAL

## 2017-05-27 MED ORDER — VANCOMYCIN HCL 10 G IV SOLR
2500.0000 mg | Freq: Once | INTRAVENOUS | Status: AC
  Administered 2017-05-27: 2500 mg via INTRAVENOUS
  Filled 2017-05-27: qty 2500

## 2017-05-27 MED ORDER — IOPAMIDOL (ISOVUE-300) INJECTION 61%
INTRAVENOUS | Status: AC
  Administered 2017-05-27: 21:00:00
  Filled 2017-05-27: qty 100

## 2017-05-27 MED ORDER — DEXTROSE 5 % IV SOLN
2.0000 g | INTRAVENOUS | Status: DC
  Administered 2017-05-28: 2 g via INTRAVENOUS
  Filled 2017-05-27: qty 2

## 2017-05-27 MED ORDER — IPRATROPIUM-ALBUTEROL 0.5-2.5 (3) MG/3ML IN SOLN: 3.0000 mL | Freq: Four times a day (QID) | RESPIRATORY_TRACT | Status: DC | PRN

## 2017-05-27 MED ORDER — NYSTATIN 100000 UNIT/GM EX POWD
Freq: Two times a day (BID) | CUTANEOUS | Status: DC
  Administered 2017-05-27 – 2017-06-04 (×17): via TOPICAL
  Filled 2017-05-27: qty 15

## 2017-05-27 MED ORDER — ESCITALOPRAM OXALATE 10 MG PO TABS
10.0000 mg | ORAL_TABLET | Freq: Every day | ORAL | Status: DC
  Administered 2017-05-28 – 2017-06-05 (×9): 10 mg via ORAL
  Filled 2017-05-27 (×9): qty 1

## 2017-05-27 MED ORDER — IOPAMIDOL (ISOVUE-300) INJECTION 61%
100.0000 mL | Freq: Once | INTRAVENOUS | Status: AC | PRN
  Administered 2017-05-27: 100 mL via INTRAVENOUS

## 2017-05-27 MED ORDER — ALUM & MAG HYDROXIDE-SIMETH 200-200-20 MG/5ML PO SUSP: 30.0000 mL | Freq: Four times a day (QID) | ORAL | Status: DC | PRN

## 2017-05-27 MED ORDER — INSULIN GLARGINE 100 UNIT/ML ~~LOC~~ SOLN
28.0000 [IU] | Freq: Every day | SUBCUTANEOUS | Status: DC
  Administered 2017-05-27 – 2017-06-03 (×8): 28 [IU] via SUBCUTANEOUS
  Filled 2017-05-27 (×9): qty 0.28

## 2017-05-27 MED ORDER — GUAIFENESIN ER 600 MG PO TB12
600.0000 mg | ORAL_TABLET | Freq: Two times a day (BID) | ORAL | Status: DC
  Administered 2017-05-27 – 2017-06-05 (×18): 600 mg via ORAL
  Filled 2017-05-27 (×19): qty 1

## 2017-05-27 MED ORDER — ACETAMINOPHEN 325 MG PO TABS
650.0000 mg | ORAL_TABLET | Freq: Four times a day (QID) | ORAL | Status: DC | PRN
  Filled 2017-05-27: qty 2

## 2017-05-27 MED ORDER — TRAMADOL-ACETAMINOPHEN 37.5-325 MG PO TABS
1.0000 | ORAL_TABLET | Freq: Four times a day (QID) | ORAL | Status: DC | PRN
  Administered 2017-05-28 – 2017-05-29 (×3): 1 via ORAL
  Administered 2017-05-30 – 2017-06-04 (×5): 2 via ORAL
  Filled 2017-05-27 (×5): qty 2
  Filled 2017-05-27 (×3): qty 1
  Filled 2017-05-27: qty 2
  Filled 2017-05-27: qty 1

## 2017-05-27 MED ORDER — ROPINIROLE HCL 0.5 MG PO TABS
0.5000 mg | ORAL_TABLET | Freq: Every day | ORAL | Status: DC
  Administered 2017-05-27 – 2017-06-04 (×9): 0.5 mg via ORAL
  Filled 2017-05-27 (×9): qty 1

## 2017-05-27 MED ORDER — INSULIN ASPART 100 UNIT/ML ~~LOC~~ SOLN
0.0000 [IU] | Freq: Three times a day (TID) | SUBCUTANEOUS | Status: DC
  Administered 2017-05-28 (×3): 2 [IU] via SUBCUTANEOUS
  Administered 2017-05-29: 7 [IU] via SUBCUTANEOUS
  Administered 2017-05-29 (×2): 2 [IU] via SUBCUTANEOUS
  Administered 2017-05-30: 3 [IU] via SUBCUTANEOUS
  Administered 2017-05-30: 1 [IU] via SUBCUTANEOUS
  Administered 2017-05-30: 2 [IU] via SUBCUTANEOUS
  Administered 2017-05-31: 1 [IU] via SUBCUTANEOUS
  Administered 2017-05-31: 7 [IU] via SUBCUTANEOUS
  Administered 2017-06-01: 3 [IU] via SUBCUTANEOUS
  Administered 2017-06-01 – 2017-06-02 (×2): 2 [IU] via SUBCUTANEOUS
  Administered 2017-06-02: 3 [IU] via SUBCUTANEOUS
  Administered 2017-06-02: 2 [IU] via SUBCUTANEOUS
  Administered 2017-06-03: 3 [IU] via SUBCUTANEOUS
  Administered 2017-06-03: 2 [IU] via SUBCUTANEOUS
  Administered 2017-06-04: 5 [IU] via SUBCUTANEOUS
  Administered 2017-06-04: 9 [IU] via SUBCUTANEOUS
  Administered 2017-06-04: 2 [IU] via SUBCUTANEOUS
  Administered 2017-06-05: 3 [IU] via SUBCUTANEOUS
  Administered 2017-06-05: 2 [IU] via SUBCUTANEOUS

## 2017-05-27 MED ORDER — AMLODIPINE BESYLATE 5 MG PO TABS
5.0000 mg | ORAL_TABLET | Freq: Every day | ORAL | Status: DC
  Administered 2017-05-28 – 2017-06-05 (×9): 5 mg via ORAL
  Filled 2017-05-27 (×9): qty 1

## 2017-05-27 MED ORDER — PREDNISONE 20 MG PO TABS
10.0000 mg | ORAL_TABLET | Freq: Every day | ORAL | Status: DC
  Administered 2017-05-28 – 2017-06-05 (×9): 10 mg via ORAL
  Filled 2017-05-27 (×9): qty 1

## 2017-05-27 MED ORDER — HYDROXYZINE HCL 25 MG PO TABS
25.0000 mg | ORAL_TABLET | Freq: Three times a day (TID) | ORAL | Status: DC
  Administered 2017-05-27 – 2017-06-05 (×26): 25 mg via ORAL
  Filled 2017-05-27 (×26): qty 1

## 2017-05-27 MED ORDER — CHLORHEXIDINE GLUCONATE 0.12 % MT SOLN
15.0000 mL | Freq: Two times a day (BID) | OROMUCOSAL | Status: DC
  Administered 2017-05-28 – 2017-06-04 (×15): 15 mL via OROMUCOSAL
  Filled 2017-05-27 (×15): qty 15

## 2017-05-27 MED ORDER — SODIUM CHLORIDE 0.9 % IV SOLN
INTRAVENOUS | Status: DC
  Administered 2017-05-27: 21:00:00 via INTRAVENOUS

## 2017-05-27 MED ORDER — SENNOSIDES-DOCUSATE SODIUM 8.6-50 MG PO TABS
2.0000 | ORAL_TABLET | Freq: Every day | ORAL | Status: DC
Start: 2017-05-27 — End: 2017-06-05
  Administered 2017-05-27 – 2017-05-30 (×4): 2 via ORAL
  Filled 2017-05-27 (×7): qty 2

## 2017-05-27 MED ORDER — ENOXAPARIN SODIUM 80 MG/0.8ML ~~LOC~~ SOLN
80.0000 mg | Freq: Every day | SUBCUTANEOUS | Status: DC
  Administered 2017-05-27 – 2017-06-04 (×9): 80 mg via SUBCUTANEOUS
  Filled 2017-05-27 (×7): qty 0.8

## 2017-05-27 NOTE — ED Provider Notes (Signed)
[facility] DEPT Provider Note   CSN: [id number] Arrival date & time: 05/27/17  1659     History   Chief Complaint Chief Complaint  Patient presents with  . Cellulitis  . Urinary Retention    HPI [name] is a 71 y.o. male.  HPI  71 y.o. male with a hx of CAD, DM2, Neurogenic Bladder, presents to the Emergency Department today via EMS from [facility] requesting foley replacement. Facility unable to replace. Notes having foley for several years. Pt also presents due to left hip cellulitis. Unsure of duration. No hx same. Denies fevers. No chills or night sweats. Facility concerned and wished for this to be evaluated as well. On EMS arrival, noted blood sugar 380s. Pt on Humolin before meals. No long acting Insulin. Denies N/V. Endorse mild suprapubic abdominal pain. Rates 4/10. Pressure sensation. During EMS arrival, moving from bed to stretcher, pt had syncopal episode. Hx same. Denies CP/SOB. No headaches. No weakness. No unilateral weakness. No other symptoms noted.    Past Medical History:  Diagnosis Date  . Arthritis    "hands, ankles, knees" (11/10/2014)  . Bladder spasms   . CAD (coronary artery disease)   . CAD in native artery 09/15/2015  . CHF (congestive heart failure) ([place])   . Chronic indwelling Foley catheter   . Chronic lower back pain   . Depression   . Diabetic diarrhea ([place])   . Diabetic neuropathy, painful ([place])   . DVT (deep venous thrombosis) ([facility]) 1980's   LLE  . GERD (gastroesophageal reflux disease)   . Hyperlipidemia   . Hypertension   . Neurogenic bladder   . Obese   . OSA (obstructive sleep apnea)    "they wanted me to wear a mask; I couldn't" (11/10/2014)  . Pneumonia 1999  . PONV (postoperative nausea and vomiting)   . Stroke [facility]) 10/2011; 06/2014   right hand numbness/notes 10/20/2011, pt not sure he had stroke in 2013; "right hand weaker and right face not quite right since" (11/10/2014)  . Type II diabetes mellitus ([place])   .  Uncontrolled type II diabetes with peripheral autonomic neuropathy ([place]) 05/09/2015    Patient Active Problem List   Diagnosis Date Noted  . Dyslipidemia associated with type 2 diabetes mellitus ([place]) 01/17/2017  . 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 OSA on CPAP 05/27/2016  . Hypertensive heart disease with CHF (congestive heart failure) ([place]) 04/15/2016  . H/O: CVA (cerebrovascular accident) 01/22/2016  . Depression   . GERD (gastroesophageal reflux disease) 10/22/2015  . Restless legs 10/22/2015  . CAD in native artery 09/15/2015  . Morbid obesity ([place]) 07/03/2015  . Uncontrolled type II diabetes with peripheral autonomic neuropathy ([place]) 05/09/2015  . Hyperlipidemia LDL goal <70 05/02/2015  . Neurogenic bladder 04/26/2015  . UTI (urinary tract infection) due to urinary indwelling catheter ([place]) 04/14/2015  . Chronic diastolic CHF (congestive heart failure) ([place]) 04/01/2014  . Chronic indwelling Foley catheter 12/23/2013  . Diabetic neuropathy ([place]) 10/10/2011    Past Surgical History:  Procedure Laterality Date  . CARDIAC CATHETERIZATION N/A 05/02/2015   Procedure: Left Heart Cath and Coronary Angiography;  Surgeon: Lorretta Harp, MD;  Location: [facility] CV LAB;  Service: Cardiovascular;  Laterality: N/A;  . CARDIAC CATHETERIZATION N/A 05/04/2015   Procedure: Left Heart Cath and Coronary Angiography;  Surgeon: Wellington Hampshire, MD;  Location: [facility] CV LAB;  Service: Cardiovascular;  Laterality: N/A;  . CARPAL TUNNEL RELEASE Bilateral ~ 2003-2004  . CHOLECYSTECTOMY OPEN  1970's?  Marland Kitchen  GASTROPLASTY    . JOINT REPLACEMENT    . LEFT HEART CATHETERIZATION WITH CORONARY ANGIOGRAM N/A 10/24/2011   Procedure: LEFT HEART CATHETERIZATION WITH CORONARY ANGIOGRAM;  Surgeon: Candee Furbish, MD;  Location: [facility] CATH LAB;  Service: Cardiovascular;  Laterality: N/A;  right radial artery approach  . TOTAL KNEE ARTHROPLASTY Right 1990's       Home Medications    Prior to Admission  medications   Medication Sig Start Date End Date Taking? Authorizing Provider  aluminum-magnesium hydroxide-simethicone (MAALOX) 035-009-38 MG/5ML SUSP Take 30 mLs by mouth every 6 (six) hours as needed.    [provider]  amLODipine (NORVASC) 5 MG tablet Take 5 mg by mouth daily.    [provider]  aspirin EC 81 MG tablet Take 81 mg by mouth daily. 0900    [provider]  Clotrimazole 1 % OINT Apply to trunk topically every day and night shift for itching    [provider]  escitalopram (LEXAPRO) 10 MG tablet Take 10 mg by mouth daily.     [provider]  ezetimibe (ZETIA) 10 MG tablet Take 10 mg by mouth daily.    [provider]  folic acid (FOLVITE) 1 MG tablet Take 1 mg by mouth daily.    [provider]  gabapentin (NEURONTIN) 100 MG capsule Take 1 capsule (100 mg total) by mouth 2 (two) times daily. 01/16/16   Reyne Dumas, MD  guaiFENesin (MUCINEX) 600 MG 12 hr tablet Take 600 mg by mouth 2 (two) times daily.    [provider]  hydrOXYzine (ATARAX/VISTARIL) 25 MG tablet Take 25 mg by mouth 3 (three) times daily.     [provider]  Insulin Glargine (TOUJEO SOLOSTAR) 300 UNIT/ML SOPN Inject 40 Units into the skin at bedtime.     [provider]  insulin lispro (HUMALOG) 100 UNIT/ML injection Inject 35 Units into the skin 3 (three) times daily before meals.     [provider]  ipratropium-albuterol (DUONEB) 0.5-2.5 (3) MG/3ML SOLN Take 3 mLs by nebulization every 6 (six) hours as needed (sob).     [provider]  methotrexate (RHEUMATREX) 10 MG tablet Take 10 mg by mouth every Wednesday. Caution: Chemotherapy. Protect from light.    [provider]  metoprolol tartrate (LOPRESSOR) 25 MG tablet Take 12.5 mg by mouth 2 (two) times daily.     [provider]  Multiple Vitamins-Minerals (DECUBI-VITE) CAPS Take 1 capsule by mouth daily at 3 pm.    [provider]  nitroGLYCERIN (NITROSTAT) 0.4 MG SL tablet Place 0.4 mg under the tongue every 5 (five) minutes as needed for chest pain.    [provider]  Nystatin POWD Apply 1 application transdermally two times a day for rash.  Apply to reddened areas under right side folds    [provider]  omeprazole (PRILOSEC) 20 MG capsule Take 20 mg by mouth daily.    [provider]  OXYGEN Inhale into the lungs. O2 at 2l/min via nasal canula as needed for shortness of breath    [provider]  potassium chloride (KLOR-CON) 20 MEQ packet Take 40 mEq by mouth daily. 0800    [provider]  predniSONE (DELTASONE) 10 MG tablet Take 10 mg by mouth daily.    [provider]  rOPINIRole (REQUIP) 0.5 MG tablet Take 0.5 mg by mouth at bedtime. 2100    [provider]  sennosides-docusate sodium (SENOKOT-S) 8.6-50 MG tablet Take 2 tablets  by mouth at bedtime. 2100    [provider]  torsemide (DEMADEX) 20 MG tablet Take 20 mg by mouth 2 (two) times daily.    [provider]  traMADol-acetaminophen (ULTRACET) 37.5-325 MG tablet Take 1-2 tablets by mouth every 6 (six) hours as needed for moderate pain. 12/31/16   Reed, Tiffany L, DO  traZODone (DESYREL) 50 MG tablet Take 50 mg by mouth at bedtime.     [provider]    Family History Family History  Problem Relation Age of Onset  . Diabetes type I Father   . Hypertension Mother   . Cancer Brother         Testicular Cancer  . Cancer Brother        Leukemia    Social History Social History  Substance Use Topics  . Smoking status: Former Smoker    Packs/day: 2.00    Years: 20.00    Types: Cigarettes    Quit date: 09/10/1976  . Smokeless tobacco: Never Used  . Alcohol use No     Comment: FORMER ALCOHOLIC; "sober since 93/81/8299"     Allergies   Ace inhibitors; Lipitor [atorvastatin calcium]; Metformin and related; Cefadroxil; Cephalexin; Morphine and  related; and Robaxin [methocarbamol]   Review of Systems Review of Systems ROS reviewed and all are negative for acute change except as noted in the HPI.  Physical Exam Updated Vital Signs BP (!) 135/59   Pulse 96   Temp 98.9 F (37.2 C) (Oral)   Resp 15   SpO2 97% Comment: RA  Physical Exam  Constitutional: He is oriented to person, place, and time. He appears well-developed and well-nourished. No distress.  Morbidly Obese  HENT:  Head: Normocephalic and atraumatic.  Right Ear: Tympanic membrane, external ear and ear canal normal.  Left Ear: Tympanic membrane, external ear and ear canal normal.  Nose: Nose normal.  Mouth/Throat: Uvula is midline, oropharynx is clear and moist and mucous membranes are normal. No trismus in the jaw. No oropharyngeal exudate, posterior oropharyngeal erythema or tonsillar abscesses.  Eyes: Pupils are equal, round, and reactive to light. EOM are normal.  Neck: Normal range of motion. Neck supple. No tracheal deviation present.  Cardiovascular: Normal rate, regular rhythm, S1 normal, S2 normal, normal heart sounds, intact distal pulses and normal pulses.   Pulmonary/Chest: Effort normal and breath sounds normal. No respiratory distress. He has no decreased breath sounds. He has no wheezes. He has no rhonchi. He has no rales.  Abdominal: Normal appearance and bowel sounds are normal. There is tenderness in the suprapubic area. There is no rigidity, no rebound, no guarding, no CVA tenderness, no tenderness at McBurney's point and negative [name]'s sign.  Musculoskeletal: Normal range of motion.  Neurological: He is alert and oriented to person, place, and time.  Skin: Skin is warm and dry.  Non purulent left hip cellulitis. No abscess or fluctuance palpable.   Psychiatric: He has a normal mood and affect. His speech is normal and behavior is normal. Thought content normal.    ED Treatments / Results  Labs (all labs ordered are listed, but only  abnormal results are displayed) Labs Reviewed  CBC - Abnormal; Notable for the following:       Result Value   WBC 17.5 (*)    RBC 3.97 (*)    Hemoglobin 11.8 (*)    HCT 35.8 (*)    All other components within normal limits  BASIC METABOLIC PANEL - Abnormal; Notable for the following:  Chloride 95 (*)    Glucose, Bld 222 (*)    Calcium 8.4 (*)    All other components within normal limits  URINALYSIS, ROUTINE W REFLEX MICROSCOPIC - Abnormal; Notable for the following:    APPearance CLOUDY (*)    Hgb urine dipstick MODERATE (*)    Protein, ur >=300 (*)    Nitrite POSITIVE (*)    Leukocytes, UA LARGE (*)    Bacteria, UA MANY (*)    Squamous Epithelial / LPF 0-5 (*)    All other components within normal limits  URINE CULTURE  CULTURE, BLOOD (ROUTINE X 2)  CULTURE, BLOOD (ROUTINE X 2)    EKG  EKG Interpretation None      Radiology No results found.  Procedures Procedures (including critical care time)  Medications Ordered in ED Medications  cefTRIAXone (ROCEPHIN) 1 g in dextrose 5 % 50 mL IVPB (not administered)  sodium chloride 0.9 % bolus 1,000 mL (1,000 mLs Intravenous New Bag/Given 05/27/17 1802)    Initial Impression / Assessment and Plan / ED Course  I have reviewed the triage vital signs and the nursing notes.  Pertinent labs & imaging results that were available during my care of the patient were reviewed by me and considered in my medical decision making (see chart for details).  Final Clinical Impressions(s) / ED Diagnoses  {I have reviewed and evaluated the relevant laboratory values.   {I have reviewed the relevant previous healthcare records.  {I obtained HPI from historian. {Patient discussed with supervising physician.  ED Course:  Assessment: Pt is a 71 y.o. male ith a hx of CAD, DM2, Neurogenic Bladder, presents to the Emergency Department today via EMS from [facility] requesting foley replacement. Facility unable to replace. Notes having foley for  several years. Pt also presents due to left hip cellulitis. Unsure of duration. No hx same. Denies fevers. No chills or night sweats. Facility concerned and wished for this to be evaluated as well. On EMS arrival, noted blood sugar 380s. Pt on Humolin before meals. No long acting Insulin. Denies N/V. Endorse mild suprapubic abdominal pain. Rates 4/10. Pressure sensation. During EMS arrival, moving from bed to stretcher, pt had syncopal episode. Hx same. Denies CP/SOB. No headaches. No weakness. No unilateral weakness. On exam, pt in NAD. Nontoxic/nonseptic appearing. VSS. Afebrile. Lungs CTA. Heart RRR. Abdomen nontender soft. Left hip cellulitis noted. Non purulent. CBC with leukocytosis 17. BMP unremarkable. No anion gap. Glucose stable. Foley changed in ED. UA with evidence of UTI. Seen by supervising physician. Given extensive cellulitis. Will Give Vancomycin. Rocephin for UTI. Plan is to [place].    Disposition/Plan:  Admit Pt acknowledges and agrees with plan  Supervising Physician Blanchie Dessert, MD  Final diagnoses:  Cellulitis of left hip  Acute cystitis without hematuria    New Prescriptions New Prescriptions   No medications on file     Shary Decamp, Hershal Coria 05/27/17 1929    Blanchie Dessert, MD 06/02/17 2036

## 2017-05-27 NOTE — ED Notes (Signed)
Gave report to [place], Therapist, sports.

## 2017-05-27 NOTE — Clinical Social Work Note (Signed)
Clinical Social Work Assessment  Patient Details  Name: [name] MRN: [id number] Date of Birth: 01/22/1946  Date of referral:  05/27/17               Reason for consult:  Facility Placement                Permission sought to share information with:  Facility Art therapist granted to share information::  Yes, Verbal Permission Granted  Name::        Agency::     Relationship::     Contact Information:     Housing/Transportation Living arrangements for the past 2 months:  [place] of Information:  Patient Patient Interpreter Needed:  None Criminal Activity/Legal Involvement Pertinent to Current Situation/Hospitalization:    Significant Relationships:  None Lives with:  Facility Resident Do you feel safe going back to the place where you live?  Yes Need for family participation in patient care:  No (Coment)  Care giving concerns:  None listed by pt/family   Social Worker assessment / plan:  [name] met with pt and confirmed pt's plan to be discharged back to [facility] to live at discharge.  [name] provided active listening and validated pt's concerns.   [name] Dept was given permission to complete FL-2 and send referral out to [facility] to facilitate pt's return, via the hub per pt's request.  Pt has been living at [facility] for over a year, prior to being admitted to [facility].   Employment status:  Retired Forensic scientist:  Medicare PT Recommendations:  Not assessed at this time Information / Referral to community resources:     Patient/Family's Response to care:  Patient alert and oriented.  Patient agreeable to plan.  Pt states he has no one supportive and strongly involved in pt.'s care.  Pt pleasant and appreciated [name] intervention.    Patient/Family's Understanding of and Emotional Response to Diagnosis, Current Treatment, and Prognosis:  Still assessing  Emotional Assessment Appearance:  Appears stated  age Attitude/Demeanor/Rapport:    Affect (typically observed):  Accepting, Adaptable, Stoic, Calm Orientation:  Oriented to Self, Oriented to Place, Oriented to  Time, Oriented to Situation Alcohol / Substance use:    Psych involvement (Current and /or in the community):     Discharge Needs  Concerns to be addressed:  No discharge needs identified Readmission within the last 30 days:  No Current discharge risk:  None Barriers to Discharge:  No Barriers Identified   Claudine Mouton, LCSWA 05/27/2017, 10:28 PM

## 2017-05-27 NOTE — H&P (Addendum)
History and Physical    [name] WUJ:[id number] DOB: April 11, 1946 DOA: 05/27/2017  Referring MD/NP/PA:   PCP: Gildardo Cranker, DO   Patient coming from:  The patient is coming from SNF.  At baseline, pt is dependent for most of ADL.   Chief Complaint: fever, left hip redness and pain, urinary retention, dysuria  HPI: [name] is a 71 y.o. male with medical history significant of indwelling catheter, hypertension, hyperlipidemia, diabetes mellitus, stroke, GERD, depression, OSA not on CPAP, obesity, DVT 1980, dCHF, CAD, bullous pemphigus, who presents with fever, left hip redness and pain, urinary retention, dysuria.  Initially pt was brought here from SNF for requesting foley replacement. Pt has hx of urinary retention, and is using indwelling catheter. Facility was unable to replace it. Notes having foley for several years. pt states that he has intermittent dysuria and burning on urination. Patient also has left hip redness and pain which has been going on for several days. The pain is constant, 6 out of 10 in severity, nonradiating. Pt also reports fever and chills. Pt states that he has nausea, vomiting and abdominal pain. He vomited twice without blood in the vomitus. Denies diarrhea. His abdominal pain is located in the central and lower abdomen, moderate, constant, nonradiating. He does not have chest pain, shortness of breath or cough. Per report, while PTAR was moving patient from the bed to the stretcher, he had a syncopal episode. No slurred speech, facial droop, unilateral weakness or numbness or tingling is in extremities. Patient states that he has tingling in both feet, which is a chronic issue.   ED Course: pt was found to have WBC 17.5, electrolytes renal function okay, positive urinalysis with large amount of leukocytes and positive nitrite, temperature 100, oxygen saturation 97% on room air, heart rate 96. Patient is admitted to telemetry bed as inpatient. Foley  catheter was changed in ED.  Review of Systems:   General: has fevers, chills, no body weight gain, has poor appetite, has fatigue HEENT: no blurry vision, hearing changes or sore throat Respiratory: no dyspnea, coughing, wheezing CV: no chest pain, no palpitations GI: has nausea, vomiting, abdominal pain, no diarrhea, constipation GU: has dysuria, burning on urination and difficulty urinating. No increased urinary frequency, hematuria  Ext: has trace leg edema Neuro: no unilateral weakness, numbness, or tingling, no vision change or hearing loss. Had syncope. Skin: has erythema, warmth and tenderness in the right hip area MSK: No muscle spasm, no deformity, no limitation of range of movement in spin Heme: No easy bruising.  Travel history: No recent long distant travel.  Allergy:  Allergies  Allergen Reactions  . Ace Inhibitors Swelling    Pt tolerates lisinopril  . Lipitor [Atorvastatin Calcium] Swelling  . Metformin And Related Swelling  . Cefadroxil Hives  . Cephalexin Hives  . Morphine And Related Other (See Comments)    Sweating, feels like is "in rocky boat."  . Robaxin [Methocarbamol] Other (See Comments)    Feels like he is shaky    Past Medical History:  Diagnosis Date  . Arthritis    "hands, ankles, knees" (11/10/2014)  . Bladder spasms   . CAD (coronary artery disease)   . CAD in native artery 09/15/2015  . CHF (congestive heart failure) ([place])   . Chronic indwelling Foley catheter   . Chronic lower back pain   . Depression   . Diabetic diarrhea ([place])   . Diabetic neuropathy, painful ([place])   . DVT (deep venous thrombosis) (  [place]) 1980's   LLE  . GERD (gastroesophageal reflux disease)   . Hyperlipidemia   . Hypertension   . Neurogenic bladder   . Obese   . OSA (obstructive sleep apnea)    "they wanted me to wear a mask; I couldn't" (11/10/2014)  . Pneumonia 1999  . PONV (postoperative nausea and vomiting)   . Stroke [facility]) 10/2011; 06/2014   right hand  numbness/notes 10/20/2011, pt not sure he had stroke in 2013; "right hand weaker and right face not quite right since" (11/10/2014)  . Type II diabetes mellitus ([place])   . Uncontrolled type II diabetes with peripheral autonomic neuropathy ([place]) 05/09/2015    Past Surgical History:  Procedure Laterality Date  . CARDIAC CATHETERIZATION N/A 05/02/2015   Procedure: Left Heart Cath and Coronary Angiography;  Surgeon: Lorretta Harp, MD;  Location: [place] CV LAB;  Service: Cardiovascular;  Laterality: N/A;  . CARDIAC CATHETERIZATION N/A 05/04/2015   Procedure: Left Heart Cath and Coronary Angiography;  Surgeon: Wellington Hampshire, MD;  Location: [place] CV LAB;  Service: Cardiovascular;  Laterality: N/A;  . CARPAL TUNNEL RELEASE Bilateral ~ 2003-2004  . CHOLECYSTECTOMY OPEN  1970's?  Marland Kitchen GASTROPLASTY    . JOINT REPLACEMENT    . LEFT HEART CATHETERIZATION WITH CORONARY ANGIOGRAM N/A 10/24/2011   Procedure: LEFT HEART CATHETERIZATION WITH CORONARY ANGIOGRAM;  Surgeon: Candee Furbish, MD;  Location: [facility] CATH LAB;  Service: Cardiovascular;  Laterality: N/A;  right radial artery approach  . TOTAL KNEE ARTHROPLASTY Right 1990's    Social History:  reports that he quit smoking about 40 years ago. His smoking use included Cigarettes. He has a 40.00 pack-year smoking history. He has never used smokeless tobacco. He reports that he does not drink alcohol or use drugs.  Family History:  Family History  Problem Relation Age of Onset  . Diabetes type I Father   . Hypertension Mother   . Cancer Brother         Testicular Cancer  . Cancer Brother        Leukemia     Prior to Admission medications   Medication Sig Start Date End Date Taking? Authorizing Provider  aluminum-magnesium hydroxide-simethicone (MAALOX) 867-619-50 MG/5ML SUSP Take 30 mLs by mouth every 6 (six) hours as needed.   Yes [provider]  amLODipine (NORVASC) 5 MG tablet Take 5 mg by mouth daily.   Yes [provider]    aspirin EC 81 MG tablet Take 81 mg by mouth daily. 0900   Yes [provider]  Clotrimazole 1 % OINT Apply to trunk topically every day and night shift for itching   Yes [provider]  escitalopram (LEXAPRO) 10 MG tablet Take 10 mg by mouth daily.    Yes [provider]  ezetimibe (ZETIA) 10 MG tablet Take 10 mg by mouth daily.   Yes [provider]  folic acid (FOLVITE) 1 MG tablet Take 1 mg by mouth daily.   Yes [provider]  gabapentin (NEURONTIN) 100 MG capsule Take 1 capsule (100 mg total) by mouth 2 (two) times daily. 01/16/16  Yes Reyne Dumas, MD  guaiFENesin (MUCINEX) 600 MG 12 hr tablet Take 600 mg by mouth 2 (two) times daily.   Yes [provider]  hydrOXYzine (ATARAX/VISTARIL) 25 MG tablet Take 25 mg by mouth 3 (three) times daily.    Yes [provider]  Insulin Glargine (TOUJEO SOLOSTAR) 300 UNIT/ML SOPN Inject 40 Units into the skin at bedtime.  Yes [provider]  insulin lispro (HUMALOG) 100 UNIT/ML injection Inject 35 Units into the skin 3 (three) times daily before meals.    Yes [provider]  ipratropium-albuterol (DUONEB) 0.5-2.5 (3) MG/3ML SOLN Take 3 mLs by nebulization every 6 (six) hours as needed (sob).    Yes [provider]  methotrexate (RHEUMATREX) 10 MG tablet Take 10 mg by mouth every Wednesday. Caution: Chemotherapy. Protect from light.   Yes [provider]  metoprolol tartrate (LOPRESSOR) 25 MG tablet Take 12.5 mg by mouth 2 (two) times daily.    Yes [provider]  Multiple Vitamins-Minerals (DECUBI-VITE) CAPS Take 1 capsule by mouth daily at 3 pm.   Yes [provider]  nitroGLYCERIN (NITROSTAT) 0.4 MG SL tablet Place 0.4 mg under the tongue every 5 (five) minutes as needed for chest pain.   Yes [provider]  Nystatin POWD Apply 1 application transdermally two times a day for rash.  Apply to reddened areas under right side  folds   Yes [provider]  omeprazole (PRILOSEC) 20 MG capsule Take 20 mg by mouth daily.   Yes [provider]  OXYGEN Inhale into the lungs. O2 at 2l/min via nasal canula as needed for shortness of breath   Yes [provider]  potassium chloride (KLOR-CON) 20 MEQ packet Take 40 mEq by mouth daily. 0800   Yes [provider]  predniSONE (DELTASONE) 10 MG tablet Take 10 mg by mouth daily.   Yes [provider]  rOPINIRole (REQUIP) 0.5 MG tablet Take 0.5 mg by mouth at bedtime. 2100   Yes [provider]  sennosides-docusate sodium (SENOKOT-S) 8.6-50 MG tablet Take 2 tablets by mouth at bedtime. 2100   Yes [provider]  torsemide (DEMADEX) 20 MG tablet Take 20 mg by mouth 2 (two) times daily.   Yes [provider]  traMADol-acetaminophen (ULTRACET) 37.5-325 MG tablet Take 1-2 tablets by mouth every 6 (six) hours as needed for moderate pain. 12/31/16  Yes Reed, Tiffany L, DO  traZODone (DESYREL) 50 MG tablet Take 50 mg by mouth at bedtime.    Yes [provider]    Physical Exam: Vitals:   05/27/17 1719 05/27/17 1720 05/27/17 2110 05/27/17 2209  BP: (!) 135/59  140/66 102/72  Pulse: 96  95 90  Resp: 15  20 20   Temp: 98.9 F (37.2 C)  98.8 F (37.1 C) 99.3 F (37.4 C)  TempSrc: Oral  Oral Oral  SpO2: 98% 97% 99% 99%  Weight:    (!) 165.8 kg (365 lb 8.4 oz)  Height:    6' 2"  (1.88 m)   General: Not in acute distress HEENT:       Eyes: PERRL, EOMI, no scleral icterus.       ENT: No discharge from the ears and nose, no pharynx injection, no tonsillar enlargement.        Neck: No JVD, no bruit, no mass felt. Heme: No neck lymph node enlargement. Cardiac: S1/S2, RRR, No murmurs, No gallops or rubs. Respiratory:  No rales, wheezing, rhonchi or rubs. GI: Soft, nondistended, has tenderness in central and lower abdomen, no rebound pain, no organomegaly, BS present. GU: No hematuria Ext: has trace leg edema  bilaterally. 2+DP/PT pulse bilaterally. Musculoskeletal: No joint deformities, No joint redness or warmth, no limitation of ROM in spin. Skin: has erythema, warmth and tenderness in the right hip area Neuro: Alert, oriented X3, cranial nerves II-XII grossly intact, moves all extremities. Psych: Patient  is not psychotic, no suicidal or hemocidal ideation.  Labs on Admission: I have personally reviewed following labs and imaging studies  CBC:  Recent Labs Lab 05/27/17 1739  WBC 17.5*  HGB 11.8*  HCT 35.8*  MCV 90.2  PLT 914   Basic Metabolic Panel:  Recent Labs Lab 05/27/17 1739  NA 135  K 3.6  CL 95*  CO2 31  GLUCOSE 222*  BUN 16  CREATININE 0.95  CALCIUM 8.4*   GFR: Estimated Creatinine Clearance: 120 mL/min (by C-G formula based on SCr of 0.95 mg/dL). Liver Function Tests: No results for input(s): AST, ALT, ALKPHOS, BILITOT, PROT, ALBUMIN in the last 168 hours.  Recent Labs Lab 05/27/17 2028  LIPASE 15   No results for input(s): AMMONIA in the last 168 hours. Coagulation Profile:  Recent Labs Lab 05/27/17 2028  INR 1.08   Cardiac Enzymes: No results for input(s): CKTOTAL, CKMB, CKMBINDEX, TROPONINI in the last 168 hours. BNP (last 3 results) No results for input(s): PROBNP in the last 8760 hours. HbA1C: No results for input(s): HGBA1C in the last 72 hours. CBG:  Recent Labs Lab 05/27/17 2325  GLUCAP 227*   Lipid Profile: No results for input(s): CHOL, HDL, LDLCALC, TRIG, CHOLHDL, LDLDIRECT in the last 72 hours. Thyroid Function Tests: No results for input(s): TSH, T4TOTAL, FREET4, T3FREE, THYROIDAB in the last 72 hours. Anemia Panel: No results for input(s): VITAMINB12, FOLATE, FERRITIN, TIBC, IRON, RETICCTPCT in the last 72 hours. Urine analysis:    Component Value Date/Time   COLORURINE YELLOW 05/27/2017 1739   APPEARANCEUR CLOUDY (A) 05/27/2017 1739   LABSPEC 1.013 05/27/2017 1739   PHURINE 5.0 05/27/2017 1739   GLUCOSEU NEGATIVE  05/27/2017 1739   HGBUR MODERATE (A) 05/27/2017 1739   BILIRUBINUR NEGATIVE 05/27/2017 1739   KETONESUR NEGATIVE 05/27/2017 1739   PROTEINUR >=300 (A) 05/27/2017 1739   UROBILINOGEN 1.0 07/01/2015 1455   NITRITE POSITIVE (A) 05/27/2017 1739   LEUKOCYTESUR LARGE (A) 05/27/2017 1739   Sepsis Labs: @LABRCNTIP (procalcitonin:4,lacticidven:4) )No results found for this or any previous visit (from the past 240 hour(s)).   Radiological Exams on Admission: Ct Abdomen Pelvis W Contrast  Result Date: 05/27/2017 CLINICAL DATA:  Indwelling catheter fever in left hip redness and pain, urinary retention EXAM: CT ABDOMEN AND PELVIS WITH CONTRAST TECHNIQUE: Multidetector CT imaging of the abdomen and pelvis was performed using the standard protocol following bolus administration of intravenous contrast. CONTRAST:  135m ISOVUE-300 IOPAMIDOL (ISOVUE-300) INJECTION 61% COMPARISON:  05/12/2015 FINDINGS: Lower chest: Lung bases demonstrate no acute consolidation or pleural effusion. Linear atelectasis or scar at the right base. Borderline enlarged heart. Aortic and mitral calcifications. Hepatobiliary: Surgical clips at the gallbladder fossa. No focal hepatic abnormality or biliary dilatation Pancreas: Unremarkable. No pancreatic ductal dilatation or surrounding inflammatory changes. Spleen: Normal in size without focal abnormality. Adrenals/Urinary Tract: Right adrenal gland normal. 2.3 cm fat density mass in the left adrenal gland consistent with myelolipoma. Subcentimeter hypodensities in both kidneys too small to characterize. Negative for hydronephrosis. Slight decreased density of left kidney but excretion present on delayed images. Foley catheter is present with the balloon in the bladder base. Bladder is empty. The tip of the Foley catheter tents the superior wall of the bladder. Stomach/Bowel: Postsurgical changes of the stomach. Multiple surgical clips in the gastrosplenic region. No dilated small bowel. No  colon wall thickening. Normal appendix. Vascular/Lymphatic: Aortic atherosclerosis. No enlarged abdominal or pelvic lymph nodes. Reproductive: No masses Other: Negative for free air or free fluid. Musculoskeletal: Degenerative changes.  Grade 1 anterolisthesis of L4 on L5. Chronic wedging deformities at T10, T11 and T12. Left hip is incompletely included in the field of view. IMPRESSION: 1. Negative for hydronephrosis. Nonspecific perinephric fat stranding. Foley catheter is present in the bladder which is empty, the tip of the catheter appears to tent the superior wall of the bladder. 2. There are no acute intra-abdominal or pelvic abnormalities seen. 3. 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   On: 05/27/2017 21:32     EKG:   Not done in ED, will get one.   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 Patient meets criteria for sepsis with leukocytosis and fever. Lactic acid is pending. Currently hemodynamically stable.  - will admit to tele bed as inpt - Empiric antimicrobial treatment with vancomycin for rocephin - PRN Zofran for nausea, morphine and Ultracet for pain - ESR and CRP - will get Procalcitonin and trend lactic acid levels per sepsis protocol. - IVF: 1.0 L of NS bolus in ED, followed by 75 cc/h  (patient has  congestive heart failure, limiting aggressive IV fluids treatment). - INR/PTT/type & screen -Blood cultures x 2  And Ux  UTI due to urinary indwelling catheter -see above  Urinary retention and Chronic indwelling Foley catheter: -Foley cath was changed in ED.  Syncope: Patient had one episode of syncope when he was transferred from bed to the stretcher. No slurred speech, facial droop, unilateral weakness or numbness or tingling is in extremities. No signs of stroke or seizure. Likely due to multifactorial etiology, including cellulitis, abdominal pain, UTI and multiple comorbidities. -Frequent neurologic checks -Treat underlying issues  Chronic diastolic CHF (congestive heart failure) ([place]): 2-D echo on 05/28/16 showed normal systolic function. Patient has trace leg edema. CHF seems to be compensated. -Hold torsemide and due to sepsis -Check BNP -Continue aspirin and metoprolol  Abdominal pain, nausea and vomiting: Etiology is not clear. His lower abdominal pain is partially due to UTI -Check lipase -f/u CT-abdomen/pelvis  HLD: -Zetia  GERD: -Protonix  Uncontrolled type II diabetes with peripheral autonomic neuropathy ([place]): Last A1c 9.8 on 05/08/17, poorly controled. Patient is taking glargine insulin and Humalog at home -will decrease glargine insulin Lantus dose from 40-28 units daily  -SSI  H/O: CVA (cerebrovascular accident):  -Continue aspirin, Zetia  Bullous pemphigus: -continue home prednisone and methotrexate -Check cortisol level   DVT ppx:  SQ Lovenox Code Status: Full code Family Communication: None at bed side.  Disposition Plan:  Anticipate discharge back to SNF Consults called: none   Admission status:  Inpatient/tele     Date of Service 05/28/2017    Ivor Costa [facility] Pager [phone]  If 7PM-7AM, please contact night-coverage [email or web address] Password [facility] 05/28/2017, 3:27 AM

## 2017-05-27 NOTE — Progress Notes (Signed)
Pharmacy Antibiotic Note  [name] is a 71 y.o. male admitted on 05/27/2017 with left hip cellulitis.  Pharmacy has been consulted for vancomycin dosing.  Plan:  Vancomycin 2500 mg loading dose followed by 1250 mg IV q12h.   Daily SCr. Patient at risk for vancomycin-induced nephrotoxicity given obesity and large dosing.   Adjust Ceftriaxone to 2g IV q24h due to obesity. Noted allergy to first generation cephalosporins, cefadroxil and cephalexin. Per chart review, patient tolerated ceftriaxone in 2015, so will continue with ceftriaxone order.     Temp (24hrs), Avg:99.5 F (37.5 C), Min:98.9 F (37.2 C), Max:100 F (37.8 C)   Recent Labs Lab 05/27/17 1739  WBC 17.5*  CREATININE 0.95    Estimated Creatinine Clearance: 115.3 mL/min (by C-G formula based on SCr of 0.95 mg/dL).    Allergies  Allergen Reactions  . Ace Inhibitors Swelling    Pt tolerates lisinopril  . Lipitor [Atorvastatin Calcium] Swelling  . Metformin And Related Swelling  . Cefadroxil Hives  . Cephalexin Hives  . Morphine And Related Other (See Comments)    Sweating, feels like is "in rocky boat."  . Robaxin [Methocarbamol] Other (See Comments)    Feels like he is shaky    Antimicrobials this admission: 9/17 ceftriaxone >> 9/17 vancomycin >>  Dose adjustments this admission:  Microbiology results:  Thank you for allowing pharmacy to be a part of this patient's care.  Hershal Coria 05/27/2017 8:12 PM

## 2017-05-27 NOTE — ED Notes (Signed)
Call report to [place] [phone] at 8:40 pm. Thank you.

## 2017-05-27 NOTE — ED Notes (Signed)
Delayed antibiotics due to CT wanting patient for scan and will have to stop medication for test.

## 2017-05-27 NOTE — ED Notes (Signed)
Patient reports the facility was attempting to replace foley catheter and unable to get the new one in place.

## 2017-05-27 NOTE — ED Triage Notes (Signed)
Patient is from [place] and transported via [facility]. Per EMS, patient is here for replacement of foley catheter and cellulitis to the left hip. Also, EMS reports patient usually checks his own blood sugar and today reading was in 380's. Also, while PTAR was moving patient from the bed to the stretcher, he had a syncopal episode. However, he has a past medical history of syncopal episodes.

## 2017-05-27 NOTE — Progress Notes (Signed)
Location:   [facility] Room Number: 216 B Place of Service:  SNF (31)   CODE STATUS: Full Code  Allergies  Allergen Reactions  . Ace Inhibitors Swelling    Pt tolerates lisinopril  . Lipitor [Atorvastatin Calcium] Swelling  . Metformin And Related Swelling  . Cefadroxil Hives  . Cephalexin Hives  . Morphine And Related Other (See Comments)    Sweating, feels like is "in rocky boat."  . Robaxin [Methocarbamol] Other (See Comments)    Feels like he is shaky    Chief Complaint  Patient presents with  . Acute Visit    Cellulitis    HPI:  Staff reports that he was found today with his left thigh is red and inflamed and painful. He is complaining of pain in his foley and feels as though he needs to have his foley changed. He told me that he wants me to change his foley. He tells me that his foley has been painful for the past several days; he is drinking water without effect. He told me that he is not sure how long his left thigh has been red. He did say that he feels bad.  I have been unable to insert new foley and he has agreed to go to the hospital.    Past Medical History:  Diagnosis Date  . Arthritis    "hands, ankles, knees" (11/10/2014)  . Bladder spasms   . CAD (coronary artery disease)   . CAD in native artery 09/15/2015  . CHF (congestive heart failure) ([place])   . Chronic indwelling Foley catheter   . Chronic lower back pain   . Depression   . Diabetic diarrhea ([place])   . Diabetic neuropathy, painful ([place])   . DVT (deep venous thrombosis) (HCC) 1980's   LLE  . GERD (gastroesophageal reflux disease)   . Hyperlipidemia   . Hypertension   . Neurogenic bladder   . Obese   . OSA (obstructive sleep apnea)    "they wanted me to wear a mask; I couldn't" (11/10/2014)  . Pneumonia 1999  . PONV (postoperative nausea and vomiting)   . Stroke [facility]) 10/2011; 06/2014   right hand numbness/notes 10/20/2011, pt not sure he had stroke in 2013; "right hand weaker and  right face not quite right since" (11/10/2014)  . Type II diabetes mellitus ([place])   . Uncontrolled type II diabetes with peripheral autonomic neuropathy ([place]) 05/09/2015    Past Surgical History:  Procedure Laterality Date  . CARDIAC CATHETERIZATION N/A 05/02/2015   Procedure: Left Heart Cath and Coronary Angiography;  Surgeon: Lorretta Harp, MD;  Location: [facility] [place] CV LAB;  Service: Cardiovascular;  Laterality: N/A;  . CARDIAC CATHETERIZATION N/A 05/04/2015   Procedure: Left Heart Cath and Coronary Angiography;  Surgeon: Wellington Hampshire, MD;  Location: [facility] CV LAB;  Service: Cardiovascular;  Laterality: N/A;  . CARPAL TUNNEL RELEASE Bilateral ~ 2003-2004  . CHOLECYSTECTOMY OPEN  1970's?  Marland Kitchen GASTROPLASTY    . JOINT REPLACEMENT    . LEFT HEART CATHETERIZATION WITH CORONARY ANGIOGRAM N/A 10/24/2011   Procedure: LEFT HEART CATHETERIZATION WITH CORONARY ANGIOGRAM;  Surgeon: Candee Furbish, MD;  Location: [facility] CATH LAB;  Service: Cardiovascular;  Laterality: N/A;  right radial artery approach  . TOTAL KNEE ARTHROPLASTY Right 1990's    Social History   Social History  . Marital status: Widowed    Spouse name: N/A  . Number of children: N/A  . Years of education: N/A   Occupational History  .  Retired    Social History Main Topics  . Smoking status: Former Smoker    Packs/day: 2.00    Years: 20.00    Types: Cigarettes    Quit date: 09/10/1976  . Smokeless tobacco: Never Used  . Alcohol use No     Comment: FORMER ALCOHOLIC; "sober since 28/31/5176"  . Drug use: No  . Sexual activity: No   Other Topics Concern  . Not on file   Social History Narrative   Lives alone.  No close family nearby.     Family History  Problem Relation Age of Onset  . Diabetes type I Father   . Hypertension Mother   . Cancer Brother         Testicular Cancer  . Cancer Brother        Leukemia      VITAL SIGNS BP (!) 150/73   Pulse 96   Temp 100 F (37.8 C) (Axillary) Comment: Axillary   Ht 6\' 1"  (1.854 m)   Wt (!) 348 lb 3.2 oz (157.9 kg)   SpO2 97%   BMI 45.94 kg/m    Patient's Medications  New Prescriptions   No medications on file  Previous Medications   ALUMINUM-MAGNESIUM HYDROXIDE-SIMETHICONE (MAALOX) 160-737-10 MG/5ML SUSP    Take 30 mLs by mouth every 6 (six) hours as needed.   AMLODIPINE (NORVASC) 5 MG TABLET    Take 5 mg by mouth daily.   ASPIRIN EC 81 MG TABLET    Take 81 mg by mouth daily. 0900   CLOTRIMAZOLE 1 % OINT    Apply to trunk topically every day and night shift for itching   ESCITALOPRAM (LEXAPRO) 10 MG TABLET    Take 10 mg by mouth daily.    EZETIMIBE (ZETIA) 10 MG TABLET    Take 10 mg by mouth daily.   FOLIC ACID (FOLVITE) 1 MG TABLET    Take 1 mg by mouth daily.   GABAPENTIN (NEURONTIN) 100 MG CAPSULE    Take 1 capsule (100 mg total) by mouth 2 (two) times daily.   GUAIFENESIN (MUCINEX) 600 MG 12 HR TABLET    Take 600 mg by mouth 2 (two) times daily.   HYDROXYZINE (ATARAX/VISTARIL) 25 MG TABLET    Take 25 mg by mouth 3 (three) times daily.    INSULIN GLARGINE (TOUJEO SOLOSTAR) 300 UNIT/ML SOPN    Inject 40 Units into the skin at bedtime.    INSULIN LISPRO (HUMALOG) 100 UNIT/ML INJECTION    Inject 35 Units into the skin 3 (three) times daily before meals.    IPRATROPIUM-ALBUTEROL (DUONEB) 0.5-2.5 (3) MG/3ML SOLN    Take 3 mLs by nebulization every 6 (six) hours as needed (sob).    METHOTREXATE (RHEUMATREX) 10 MG TABLET    Take 10 mg by mouth every Wednesday. Caution: Chemotherapy. Protect from light.   METOPROLOL TARTRATE (LOPRESSOR) 25 MG TABLET    Take 12.5 mg by mouth 2 (two) times daily.    MULTIPLE VITAMINS-MINERALS (DECUBI-VITE) CAPS    Take 1 capsule by mouth daily at 3 pm.   NITROGLYCERIN (NITROSTAT) 0.4 MG SL TABLET    Place 0.4 mg under the tongue every 5 (five) minutes as needed for chest pain.   NYSTATIN POWD    Apply 1 application transdermally two times a day for rash.  Apply to reddened areas under right side folds   OMEPRAZOLE  (PRILOSEC) 20 MG CAPSULE    Take 20 mg by mouth daily.   OXYGEN  Inhale into the lungs. O2 at 2l/min via nasal canula as needed for shortness of breath   POTASSIUM CHLORIDE (KLOR-CON) 20 MEQ PACKET    Take 40 mEq by mouth daily. 0800   PREDNISONE (DELTASONE) 10 MG TABLET    Take 10 mg by mouth daily.   ROPINIROLE (REQUIP) 0.5 MG TABLET    Take 0.5 mg by mouth at bedtime. 2100   SENNOSIDES-DOCUSATE SODIUM (SENOKOT-S) 8.6-50 MG TABLET    Take 2 tablets by mouth at bedtime. 2100   TORSEMIDE (DEMADEX) 20 MG TABLET    Take 20 mg by mouth 2 (two) times daily.   TRAMADOL-ACETAMINOPHEN (ULTRACET) 37.5-325 MG TABLET    Take 1-2 tablets by mouth every 6 (six) hours as needed for moderate pain.   TRAZODONE (DESYREL) 50 MG TABLET    Take 50 mg by mouth at bedtime.   Modified Medications   No medications on file  Discontinued Medications   No medications on file     SIGNIFICANT DIAGNOSTIC EXAMS   PREVIOUS  10-12-15: chest x-ray: Enlarged cardiac silhouette. Pulmonary vascular congestion.  10-18-15: EEG: This is a normal EEG for the patients stated age.  There were no focal, hemispheric or lateralizing features.  No epileptiform activity was recorded.  A normal EEG does not exclude the diagnosis of a seizure disorder and if seizure remains high on the list of differential diagnosis, an ambulatory EEG may be of value.  Clinical correlation is required.  01-11-16: chest x-ray: Minimal left base atelectasis.  Otherwise, normal exam.  01-11-16: ct of head: No acute intracranial abnormality. Stable mild cerebral atrophy and chronic white matter disease. No definite acute cortical infarction.  01-11-16: scrotal ultrasound: 1. Left epididymo-orchitis. 2. Microlithiasis involving the left testicle. 3. Three adjacent complex cysts/spermatoceles involving the left epididymal head.  NO NEW EXAMS  LABS REVIEWED: PREVIOUS     05-27-16: hgb a1c 11.7 07-20-16: stool for c-diff: +  07-30-16: wbc 15.1; hgb 12.4; hct  37.7; mcv 93.5; plt 289; glucose 322; bun 28.3; creat 0.91; k+ 4.3; na++ 137; liver normal albumin 3.4  12-13-16: urine micro-albumin 54.44 01-29-17: wbc 15.0; hgb 12.6; hct 38.1; mcv 93.0; plt 255; glucose 418; bun 29.1 creat 1.03; k+ 4.1; n++ 136; ca 8.6; liver normal albumin 3.3; hgb a1c 9.1  04-27-18: hgb a1c 9.8; chol 190; ldl 92; trig 299; hdl 38   NO NEW LABS   Review of Systems  Constitutional: Negative for malaise/fatigue.  Respiratory: Negative for cough and shortness of breath.   Cardiovascular: Negative for chest pain, palpitations and leg swelling.  Gastrointestinal: Negative for abdominal pain, constipation and heartburn.  Genitourinary:       Foley is painful   Musculoskeletal: Negative for back pain, joint pain and myalgias.  Skin: Negative.        Has painful redness on left outer thigh   Neurological: Negative for dizziness.  Psychiatric/Behavioral: The patient is not nervous/anxious.     Physical Exam  Constitutional: He is oriented to person, place, and time. No distress.  Obese   Eyes: Conjunctivae are normal.  Neck: Normal range of motion. Neck supple. No thyromegaly present.  Cardiovascular: Normal rate, regular rhythm, normal heart sounds and intact distal pulses.   Pulmonary/Chest: Effort normal and breath sounds normal. No respiratory distress.  Abdominal: Soft. Bowel sounds are normal. He exhibits no distension.  Genitourinary:  Genitourinary Comments: Has penile tear and has foley   Musculoskeletal: He exhibits no edema.  Is able to move all extremities  Lymphadenopathy:    He has no cervical adenopathy.  Neurological: He is alert and oriented to person, place, and time.  Skin: Skin is warm and dry. He is not diaphoretic.  Left outer hip and thigh; red hot inflamed and painful     ASSESSMENT/ PLAN:  1. Cellulitis left thigh 2. Neurogenic bladder:   Will send him to the ED for foley change; hopefully he will return and allow his treatment of his  cellulitis here.    MD is aware of resident's narcotic use and is in agreement with current plan of care. We will attempt to wean resident as apropriate   Ok Edwards NP [facility]  Contact [phone] Monday through Friday 8am- 5pm  After hours call [phone]

## 2017-05-27 NOTE — Progress Notes (Signed)
A consult was received from an ED physician for vancomycin per pharmacy dosing.  The patient's profile has been reviewed for ht/wt/allergies/indication/available labs.   A one time order has been placed for vancomycin 2500 mg.  Further antibiotics/pharmacy consults should be ordered by admitting physician if indicated.                       Thank you,  Hershal Coria, PharmD Pager: [phone] 05/27/2017

## 2017-05-27 NOTE — Progress Notes (Signed)
PHARMACIST - PHYSICIAN COMMUNICATION CONCERNING:  Oral Chemotherapy  Methotrexate will be place on hold due to the criteria listed below per P&T approved oral chemotherapy policy.  Patient has been admitted with active infection.    Methotrexate (Trexall; Rheumatrex) hold criteria  Hgb < 8  WBC < 3  Pltc < 100K  SCr > 1.5x baseline (or > 2 if baseline unknown)  AST or ALT >3x ULN  Bili > 1.5x ULN  Ascites or pleural effusion  Diarrhea - Grade 2 or higher  Ulcerative stomatitis  Unexplained pneumonitis / hypoxemia  Active infection   Hershal Coria, PharmD, BCPS Pager: [phone] 05/27/2017 8:43 PM

## 2017-05-28 ENCOUNTER — Inpatient Hospital Stay (HOSPITAL_COMMUNITY): Payer: Medicare Other

## 2017-05-28 ENCOUNTER — Encounter (HOSPITAL_COMMUNITY): Payer: Self-pay | Admitting: Radiology

## 2017-05-28 LAB — GLUCOSE, CAPILLARY
GLUCOSE-CAPILLARY: 184 mg/dL — AB (ref 65–99)
GLUCOSE-CAPILLARY: 166 mg/dL — AB (ref 65–99)
GLUCOSE-CAPILLARY: 164 mg/dL — AB (ref 65–99)
GLUCOSE-CAPILLARY: 238 mg/dL — AB (ref 65–99)

## 2017-05-28 LAB — CBC
HEMATOCRIT: 34.9 % — AB (ref 39.0–52.0)
HEMOGLOBIN: 11.5 g/dL — AB (ref 13.0–17.0)
MCH: 29.1 pg (ref 26.0–34.0)
MCHC: 33 g/dL (ref 30.0–36.0)
MCV: 88.4 fL (ref 78.0–100.0)
Platelets: 252 10*3/uL (ref 150–400)
RBC: 3.95 MIL/uL — AB (ref 4.22–5.81)
RDW: 13.4 % (ref 11.5–15.5)
WBC: 13.3 10*3/uL — ABNORMAL HIGH (ref 4.0–10.5)

## 2017-05-28 LAB — BASIC METABOLIC PANEL
Anion gap: 9 (ref 5–15)
BUN: 14 mg/dL (ref 6–20)
CALCIUM: 8.1 mg/dL — AB (ref 8.9–10.3)
CHLORIDE: 98 mmol/L — AB (ref 101–111)
CO2: 28 mmol/L (ref 22–32)
Creatinine, Ser: 0.87 mg/dL (ref 0.61–1.24)
GFR calc non Af Amer: >60 mL/min (ref >60)
Glucose, Bld: 208 mg/dL — ABNORMAL HIGH (ref 65–99)
POTASSIUM: 3.2 mmol/L — AB (ref 3.5–5.1)
Sodium: 135 mmol/L (ref 135–145)

## 2017-05-28 LAB — CORTISOL-AM, BLOOD: Cortisol - AM: 9.5 ug/dL (ref 6.7–22.6)

## 2017-05-28 LAB — MRSA PCR SCREENING: MRSA BY PCR: POSITIVE — AB

## 2017-05-28 MED ORDER — CHLORHEXIDINE GLUCONATE CLOTH 2 % EX PADS
6.0000 | MEDICATED_PAD | Freq: Every day | CUTANEOUS | Status: AC
  Administered 2017-05-28 – 2017-06-01 (×5): 6 via TOPICAL

## 2017-05-28 MED ORDER — POTASSIUM CHLORIDE CRYS ER 20 MEQ PO TBCR
40.0000 meq | EXTENDED_RELEASE_TABLET | ORAL | Status: AC
  Filled 2017-05-28: qty 2

## 2017-05-28 MED ORDER — HYDROCERIN EX CREA
TOPICAL_CREAM | Freq: Two times a day (BID) | CUTANEOUS | Status: AC
  Administered 2017-05-28 – 2017-05-31 (×7): via TOPICAL
  Filled 2017-05-28: qty 113

## 2017-05-28 MED ORDER — IOPAMIDOL (ISOVUE-300) INJECTION 61%
100.0000 mL | Freq: Once | INTRAVENOUS | Status: AC | PRN
  Administered 2017-05-28: 100 mL via INTRAVENOUS

## 2017-05-28 MED ORDER — PIPERACILLIN-TAZOBACTAM 3.375 G IVPB
3.3750 g | Freq: Three times a day (TID) | INTRAVENOUS | Status: DC
  Administered 2017-05-28 – 2017-06-01 (×13): 3.375 g via INTRAVENOUS
  Filled 2017-05-28 (×13): qty 50

## 2017-05-28 MED ORDER — MUPIROCIN 2 % EX OINT
1.0000 "application " | TOPICAL_OINTMENT | Freq: Two times a day (BID) | CUTANEOUS | Status: AC
  Administered 2017-05-28 – 2017-06-01 (×10): 1 via NASAL
  Filled 2017-05-28 (×2): qty 22

## 2017-05-28 MED ORDER — IOPAMIDOL (ISOVUE-300) INJECTION 61%
INTRAVENOUS | Status: AC
  Filled 2017-05-28: qty 100

## 2017-05-28 NOTE — Progress Notes (Signed)
CRITICAL VALUE ALERT  Critical Value:  + MRSA PCR  Date & Time Notied:  05/28/2017 0421  Provider Notified: N/A standing orders  Orders Received/Actions taken: Protocol initiated

## 2017-05-28 NOTE — Progress Notes (Addendum)
PROGRESS NOTE    [name]  XBM:[id number] DOB: 01-Dec-1945 DOA: 05/27/2017 PCP: Gildardo Cranker, DO     Brief Narrative:  [name] is a 71 y.o. male with medical history significant of indwelling catheter, hypertension, hyperlipidemia, diabetes mellitus, stroke, GERD, depression, OSA not on CPAP, obesity, DVT 1980, dCHF, CAD, bullous pemphigus, who presents with fever, left hip redness and pain, urinary retention, dysuria. He was brought from his skilled nursing facility for request of Foley replacement. He has history of urinary retention and has a chronic indwelling catheter. Over the past 45 years. Facility was unable to replace it, which brought him to the emergency department on 9/17. He also has left hip redness and pain which had been ongoing for several days, reported fevers and chills. Patient was admitted for left-sided leg cellulitis as well as catheter related UTI.  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 (HCC)   Morbid obesity (HCC)   GERD (gastroesophageal reflux disease)   Syncope   H/O: CVA (cerebrovascular accident)   Bullous pemphigus   Cellulitis of left hip   Sepsis secondary to left hip cellulitis, catheter related urinary tract infection, present on admission -Chronic Foley in place -Empiric Vanco and Zosyn -Blood cultures pending -Urine culture pending -Check CT left hip to rule out underlying abscess  Urinary retention and chronic indwelling Foley catheter -Catheter exchanged in the emergency department 9/17  Syncope -Episode of syncope when he was transferred from bed to stretcher by EMS. No focal deficits, signs of stroke or  seizure -?Etiology, perhaps vasovagal response. Patient nonambulatory at baseline  -Treat infection as above  Hypokalemia -Replace, trend   Chronic diastolic heart failure -Appears euvolemic -Continue metoprolol  Abdominal pain, nausea, vomiting -CT abdomen pelvis unremarkable for acute pathology to explain abdominal pain -No further episodes -Advance diet to clears today  Moisture associated skin damage along bilateral buttocks -Wound RN consulted   Uncontrolled type 2 diabetes with peripheral autonomic neuropathy -Lantus, sliding scale insulin -HA1c 9.8 -Neurontin   History of CVA -Continue aspirin, zetia  Bullous pemphigus -Continue prednisone, methotrexate  HTN -Continue norvasc. Lopressor    DVT prophylaxis: lovenox Code Status: full Family Communication: no family at bedside Disposition Plan: back to SNF once stable   Consultants:   None  Procedures:   None  Antimicrobials:  Anti-infectives    Start     Dose/Rate Route Frequency Ordered Stop   05/28/17 1000  vancomycin (VANCOCIN) 1,250 mg in sodium chloride 0.9 % 250 mL IVPB     1,250 mg 166.7 mL/hr over 90 Minutes Intravenous Every 12 hours 05/27/17 2026     05/28/17 1000  piperacillin-tazobactam (ZOSYN) IVPB 3.375 g     3.375 g 12.5 mL/hr over 240 Minutes Intravenous Every 8 hours 05/28/17 0936     05/27/17 2030  cefTRIAXone (ROCEPHIN) 2 g in dextrose 5 % 50 mL IVPB  Status:  Discontinued     2 g 100 mL/hr over 30 Minutes Intravenous Every 24 hours 05/27/17 2002 05/28/17 0936   05/27/17 2000  vancomycin (VANCOCIN) 2,500 mg in sodium chloride 0.9 % 500 mL IVPB     2,500 mg 250 mL/hr over 120 Minutes Intravenous  Once  05/27/17 1946 05/27/17 2241   05/27/17 1930  cefTRIAXone (ROCEPHIN) 1 g in dextrose 5 % 50 mL IVPB  Status:  Discontinued     1 g 100 mL/hr over 30 Minutes Intravenous  Once 05/27/17 1928 05/27/17 2017       Subjective: Patient is a poor historian, cannot tell me why he was  brought to the hospital. He thinks it was because he had hard time swallowing. Currently, he has no complaints. Denies any chest pain or shortness of breath, no nausea, vomiting or abdominal pain. He does not ambulate at baseline  Objective: Vitals:   05/27/17 1720 05/27/17 2110 05/27/17 2209 05/28/17 0604  BP:  140/66 102/72 107/74  Pulse:  95 90 89  Resp:  20 20 20   Temp:  98.8 F (37.1 C) 99.3 F (37.4 C) 98.6 F (37 C)  TempSrc:  Oral Oral Oral  SpO2: 97% 99% 99% 93%  Weight:   (!) 165.8 kg (365 lb 8.4 oz) (!) 165.3 kg (364 lb 6.7 oz)  Height:   6\' 2"  (1.88 m)     Intake/Output Summary (Last 24 hours) at 05/28/17 1311 Last data filed at 05/28/17 0604  Gross per 24 hour  Intake              715 ml  Output              750 ml  Net              -35 ml   Filed Weights   05/27/17 2209 05/28/17 0604  Weight: (!) 165.8 kg (365 lb 8.4 oz) (!) 165.3 kg (364 lb 6.7 oz)    Examination:  General exam: Appears calm and comfortable, obese Respiratory system: Clear to auscultation. Respiratory effort normal. Cardiovascular system: S1 & S2 heard, RRR. No JVD, murmurs, rubs, gallops or clicks.  Gastrointestinal system: Abdomen is nondistended, soft and nontender. No organomegaly or masses felt. Normal bowel sounds heard. Central nervous system: Alert and oriented. No focal neurological deficits. Extremities: Symmetric in appearance  Skin: +moisture related skin damage along gluteal cleft and bilateral buttock, +left hip and thigh with large area of erythema outlined with marker  Psychiatry: Poor historian   Data Reviewed: I have personally reviewed following labs and imaging studies  CBC:  Recent Labs Lab 05/27/17 1739 05/28/17 0503  WBC 17.5* 13.3*  HGB 11.8* 11.5*  HCT 35.8* 34.9*  MCV 90.2 88.4  PLT 271 616   Basic Metabolic Panel:  Recent Labs Lab 05/27/17 1739 05/28/17 0503  NA 135 135  K 3.6 3.2*  CL 95* 98*  CO2 31 28  GLUCOSE 222* 208*  BUN 16 14    CREATININE 0.95 0.87  CALCIUM 8.4* 8.1*   GFR: Estimated Creatinine Clearance: 130.8 mL/min (by C-G formula based on SCr of 0.87 mg/dL). Liver Function Tests: No results for input(s): AST, ALT, ALKPHOS, BILITOT, PROT, ALBUMIN in the last 168 hours.  Recent Labs Lab 05/27/17 2028  LIPASE 15   No results for input(s): AMMONIA in the last 168 hours. Coagulation Profile:  Recent Labs Lab 05/27/17 2028  INR 1.08   Cardiac Enzymes: No results for input(s): CKTOTAL, CKMB, CKMBINDEX, TROPONINI in the last 168 hours. BNP (last 3 results) No results for input(s): PROBNP in the last 8760 hours. HbA1C: No results for input(s): HGBA1C in the last 72 hours. CBG:  Recent Labs Lab 05/27/17 2325 05/28/17 0743 05/28/17 1157  GLUCAP 227* 184* 166*   Lipid Profile: No results for  input(s): CHOL, HDL, LDLCALC, TRIG, CHOLHDL, LDLDIRECT in the last 72 hours. Thyroid Function Tests: No results for input(s): TSH, T4TOTAL, FREET4, T3FREE, THYROIDAB in the last 72 hours. Anemia Panel: No results for input(s): VITAMINB12, FOLATE, FERRITIN, TIBC, IRON, RETICCTPCT in the last 72 hours. Sepsis Labs:  Recent Labs Lab 05/27/17 2028 05/27/17 2029 05/27/17 2242  PROCALCITON 0.13  --   --   LATICACIDVEN  --  1.1 1.3    Recent Results (from the past 240 hour(s))  MRSA PCR Screening     Status: Abnormal   Collection Time: 05/27/17 11:35 PM  Result Value Ref Range Status   MRSA by PCR POSITIVE (A) NEGATIVE Final    Comment:        The GeneXpert MRSA Assay (FDA approved for NASAL specimens only), is one component of a comprehensive MRSA colonization surveillance program. It is not intended to diagnose MRSA infection nor to guide or monitor treatment for MRSA infections. RESULT CALLED TO, READ BACK BY AND VERIFIED WITH: B HOLT RN 0422 05/28/17 A [facility]        Radiology Studies: Ct Abdomen Pelvis W Contrast  Result Date: 05/27/2017 CLINICAL DATA:  Indwelling catheter fever in  left hip redness and pain, urinary retention EXAM: CT ABDOMEN AND PELVIS WITH CONTRAST TECHNIQUE: Multidetector CT imaging of the abdomen and pelvis was performed using the standard protocol following bolus administration of intravenous contrast. CONTRAST:  125mL ISOVUE-300 IOPAMIDOL (ISOVUE-300) INJECTION 61% COMPARISON:  05/12/2015 FINDINGS: Lower chest: Lung bases demonstrate no acute consolidation or pleural effusion. Linear atelectasis or scar at the right base. Borderline enlarged heart. Aortic and mitral calcifications. Hepatobiliary: Surgical clips at the gallbladder fossa. No focal hepatic abnormality or biliary dilatation Pancreas: Unremarkable. No pancreatic ductal dilatation or surrounding inflammatory changes. Spleen: Normal in size without focal abnormality. Adrenals/Urinary Tract: Right adrenal gland normal. 2.3 cm fat density mass in the left adrenal gland consistent with myelolipoma. Subcentimeter hypodensities in both kidneys too small to characterize. Negative for hydronephrosis. Slight decreased density of left kidney but excretion present on delayed images. Foley catheter is present with the balloon in the bladder base. Bladder is empty. The tip of the Foley catheter tents the superior wall of the bladder. Stomach/Bowel: Postsurgical changes of the stomach. Multiple surgical clips in the gastrosplenic region. No dilated small bowel. No colon wall thickening. Normal appendix. Vascular/Lymphatic: Aortic atherosclerosis. No enlarged abdominal or pelvic lymph nodes. Reproductive: No masses Other: Negative for free air or free fluid. Musculoskeletal: Degenerative changes. Grade 1 anterolisthesis of L4 on L5. Chronic wedging deformities at T10, T11 and T12. Left hip is incompletely included in the field of view. IMPRESSION: 1. Negative for hydronephrosis. Nonspecific perinephric fat stranding. Foley catheter is present in the bladder which is empty, the tip of the catheter appears to tent the superior  wall of the bladder. 2. There are no acute intra-abdominal or pelvic abnormalities seen. 3. 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   On: 05/27/2017 21:32      Scheduled Meds: . amLODipine  5 mg Oral Daily  . aspirin EC  81 mg Oral Daily  . chlorhexidine  15 mL Mouth Rinse BID  . Chlorhexidine Gluconate Cloth  6 each Topical Q0600  . clotrimazole   Topical BID  . enoxaparin (LOVENOX) injection  80 mg Subcutaneous QHS  . escitalopram  10 mg Oral Daily  . ezetimibe  10 mg Oral Daily  . folic acid  1 mg Oral  Daily  . gabapentin  100 mg Oral BID  . guaiFENesin  600 mg Oral BID  . hydrocerin   Topical BID  . hydrOXYzine  25 mg Oral TID  . insulin aspart  0-9 Units Subcutaneous TID WC  . insulin glargine  28 Units Subcutaneous QHS  . mouth rinse  15 mL Mouth Rinse q12n4p  . metoprolol tartrate  12.5 mg Oral BID  . multivitamin with minerals  1 tablet Oral Q1500  . mupirocin ointment  1 application Nasal BID  . nystatin   Topical BID  . pantoprazole  40 mg Oral Daily  . potassium chloride  40 mEq Oral Q4H  . predniSONE  10 mg Oral Daily  . rOPINIRole  0.5 mg Oral QHS  . senna-docusate  2 tablet Oral QHS  . traZODone  50 mg Oral QHS   Continuous Infusions: . piperacillin-tazobactam (ZOSYN)  IV    . vancomycin       LOS: 1 day    Time spent: 40 minutes   Dessa Phi, DO [facility] [email or web address] Password [email or web address] 05/28/2017, 1:11 PM

## 2017-05-28 NOTE — NC FL2 (Signed)
[place] LEVEL OF CARE SCREENING TOOL     IDENTIFICATION  Patient Name: [name] Birthdate: May 03, 1946 Sex: male Admission Date (Current Location): 05/27/2017  [facility] and [place] Number:  Herbalist and Address:  [facility],  [place], [place]      Provider Number: [id number]  Attending Physician Name and Address:  Dessa Phi Chahn-Yan*  Relative Name and Phone Number:       Current Level of Care: Hospital Recommended Level of Care: [place] Prior Approval Number:    Date Approved/Denied:   PASRR Number: [id number]  Discharge Plan: SNF    Current Diagnoses: Patient Active Problem List   Diagnosis Date Noted  . Cellulitis of left hip 05/27/2017  . Acute cystitis without hematuria   . Dyslipidemia associated with type 2 diabetes mellitus ([place]) 01/17/2017  . 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 OSA on CPAP 05/27/2016  . Hypertensive heart disease with CHF (congestive heart failure) ([place]) 04/15/2016  . H/O: CVA (cerebrovascular accident) 01/22/2016  . Sepsis ([place]) 01/11/2016  . Depression   . Syncope 11/01/2015  . GERD (gastroesophageal reflux disease) 10/22/2015  . Restless legs 10/22/2015  . CAD in native artery 09/15/2015  . Morbid obesity ([place]) 07/03/2015  . Uncontrolled type II diabetes with peripheral autonomic neuropathy ([place]) 05/09/2015  . Hyperlipidemia LDL goal <70 05/02/2015  . Neurogenic bladder 04/26/2015  . UTI (urinary tract infection) due to urinary indwelling catheter ([place]) 04/14/2015  . Abdominal pain 06/25/2014  . Chronic diastolic CHF (congestive heart failure) ([place]) 04/01/2014  . Chronic indwelling Foley catheter 12/23/2013  . Diabetic neuropathy ([place]) 10/10/2011    Orientation RESPIRATION BLADDER Height & Weight     Self, Place  Normal Indwelling catheter Weight: (!) 364 lb 6.7 oz (165.3 kg) Height:  6\' 2"  (188 cm)  BEHAVIORAL SYMPTOMS/MOOD NEUROLOGICAL BOWEL  NUTRITION STATUS      Incontinent Diet (NPO (see DC summary for diet))  AMBULATORY STATUS COMMUNICATION OF NEEDS Skin   Total Care Verbally Other (Comment)    multiple open spots fro MASD, right/left buttocks                   Personal Care Assistance Level of Assistance  Bathing, Dressing Bathing Assistance: Maximum assistance   Dressing Assistance: Limited assistance     Functional Limitations Info  Sight          SPECIAL CARE FACTORS FREQUENCY                       Contractures Contractures Info: Not present    Additional Factors Info  Code Status, Allergies, Isolation Precautions Code Status Info: full code Allergies Info: Ace Inhibitors, Lipitor Atorvastatin Calcium, Metformin And Related, Cefadroxil, Cephalexin, Morphine And Related, Robaxin Methocarbamol     Isolation Precautions Info: contact precautions     Current Medications (05/28/2017):  This is the current hospital active medication list Current Facility-Administered Medications  Medication Dose Route Frequency Provider Last Rate Last Dose  . acetaminophen (TYLENOL) tablet 650 mg  650 mg Oral Q6H PRN Ivor Costa, MD       Or  . acetaminophen (TYLENOL) suppository 650 mg  650 mg Rectal Q6H PRN Ivor Costa, MD      . alum & mag hydroxide-simeth (MAALOX/MYLANTA) 200-200-20 MG/5ML suspension 30 mL  30 mL Oral Q6H PRN Ivor Costa, MD      . amLODipine (NORVASC) tablet 5 mg  5 mg Oral Daily Niu,  Soledad Gerlach, MD      . aspirin EC tablet 81 mg  81 mg Oral Daily Ivor Costa, MD      . chlorhexidine (PERIDEX) 0.12 % solution 15 mL  15 mL Mouth Rinse BID Ivor Costa, MD      . Chlorhexidine Gluconate Cloth 2 % PADS 6 each  6 each Topical Q0600 Ivor Costa, MD   6 each at 05/28/17 0601  . clotrimazole (LOTRIMIN) 1 % cream   Topical BID Ivor Costa, MD      . enoxaparin (LOVENOX) injection 80 mg  80 mg Subcutaneous QHS Ivor Costa, MD   80 mg at 05/27/17 2330  . escitalopram (LEXAPRO) tablet 10 mg  10 mg Oral Daily Ivor Costa, MD      . ezetimibe (ZETIA) tablet 10 mg  10 mg Oral Daily Ivor Costa, MD      . folic acid (FOLVITE) tablet 1 mg  1 mg Oral Daily Ivor Costa, MD      . gabapentin (NEURONTIN) capsule 100 mg  100 mg Oral BID Ivor Costa, MD   100 mg at 05/27/17 2331  . guaiFENesin (MUCINEX) 12 hr tablet 600 mg  600 mg Oral BID Ivor Costa, MD   600 mg at 05/27/17 2332  . hydrALAZINE (APRESOLINE) injection 5 mg  5 mg Intravenous Q2H PRN Ivor Costa, MD      . HYDROmorphone (DILAUDID) injection 1 mg  1 mg Intravenous Q4H PRN Ivor Costa, MD      . hydrOXYzine (ATARAX/VISTARIL) tablet 25 mg  25 mg Oral TID Ivor Costa, MD   25 mg at 05/27/17 2331  . insulin aspart (novoLOG) injection 0-9 Units  0-9 Units Subcutaneous TID WC Ivor Costa, MD   2 Units at 05/28/17 [phone]  . insulin glargine (LANTUS) injection 28 Units  28 Units Subcutaneous QHS Ivor Costa, MD   28 Units at 05/27/17 2330  . ipratropium-albuterol (DUONEB) 0.5-2.5 (3) MG/3ML nebulizer solution 3 mL  3 mL Nebulization Q6H PRN Ivor Costa, MD      . MEDLINE mouth rinse  15 mL Mouth Rinse q12n4p Ivor Costa, MD      . metoprolol tartrate (LOPRESSOR) tablet 12.5 mg  12.5 mg Oral BID Ivor Costa, MD   12.5 mg at 05/27/17 2332  . multivitamin with minerals tablet 1 tablet  1 tablet Oral Q1500 Ivor Costa, MD      . mupirocin ointment (BACTROBAN) 2 % 1 application  1 application Nasal BID Ivor Costa, MD      . nitroGLYCERIN (NITROSTAT) SL tablet 0.4 mg  0.4 mg Sublingual Q5 Min x 3 PRN Ivor Costa, MD      . nystatin (MYCOSTATIN/NYSTOP) topical powder   Topical BID Ivor Costa, MD      . ondansetron [facility]) injection 4 mg  4 mg Intravenous Q8H PRN Ivor Costa, MD      . pantoprazole (PROTONIX) EC tablet 40 mg  40 mg Oral Daily Ivor Costa, MD      . piperacillin-tazobactam (ZOSYN) IVPB 3.375 g  3.375 g Intravenous Q8H Dessa Phi Chahn-Yang, DO      . potassium chloride SA (K-DUR,KLOR-CON) CR tablet 40 mEq  40 mEq Oral Q4H Dessa Phi Chahn-Yang, DO      .  predniSONE (DELTASONE) tablet 10 mg  10 mg Oral Daily Ivor Costa, MD      . rOPINIRole (REQUIP) tablet 0.5 mg  0.5 mg Oral QHS Ivor Costa, MD   0.5 mg at 05/27/17 2331  .  senna-docusate (Senokot-S) tablet 2 tablet  2 tablet Oral QHS Ivor Costa, MD   2 tablet at 05/27/17 2331  . traMADol-acetaminophen (ULTRACET) 37.5-325 MG per tablet 1-2 tablet  1-2 tablet Oral Q6H PRN Ivor Costa, MD      . traZODone (DESYREL) tablet 50 mg  50 mg Oral QHS Ivor Costa, MD   50 mg at 05/27/17 2332  . vancomycin (VANCOCIN) 1,250 mg in sodium chloride 0.9 % 250 mL IVPB  1,250 mg Intravenous Q12H Runyon, Amanda M, RPH      . zolpidem (AMBIEN) tablet 5 mg  5 mg Oral QHS PRN Ivor Costa, MD   5 mg at 05/28/17 0221     Discharge Medications: Please see discharge summary for a list of discharge medications.  Relevant Imaging Results:  Relevant Lab Results:   Additional Information SS#: [id number]  Nila Nephew, LCSW

## 2017-05-28 NOTE — Consult Note (Signed)
[place] Nurse wound consult note Reason for Consult: Moisture associated skin damage, specifically IAD and ITD Wound type:Moisture Pressure Injury POA: N/A Measurement: scattered open areas on bilateral buttocks related to moisture and friction, the largest of which measures 1.5cm x 1cm x 0.2cm. Pink, moist, no exudate. Wound bed:As described above Drainage (amount, consistency, odor) As described above Periwound: Blanching erythema Dressing procedure/placement/frequency:Patient will reposition on side, but requires assist of 2. Body habitus requires a larger/wider sleep surface, so a bariatric bed with low air loss feature is provided today.  Moisturizing of LEs and feet with Eucerin will start today. Heels are intact. I have requested PT to evaluation and see if the trapeze I requested today will assist patient with bed mobility to spare buttock tissue from moisture with friction component. [facility] nursing team will not follow, but will remain available to this patient, the nursing and medical teams.  Please re-consult if needed. Thanks, Maudie Flakes, MSN, RN, [place], Arther Abbott  Pager# [phone]

## 2017-05-28 NOTE — Progress Notes (Signed)
Patient refuses bariatric low air loss bed. States he has tried them before and it gives him no support and hurts his back. Encouraged him to try for skin issues but he refuses still. Eulas Post, RN

## 2017-05-28 NOTE — Progress Notes (Signed)
Nutrition Brief Note  Patient identified on the Malnutrition Screening Tool (MST) Report  Wt Readings from Last 15 Encounters:  05/28/17 (!) 364 lb 6.7 oz (165.3 kg)  05/27/17 (!) 348 lb 3.2 oz (157.9 kg)  05/20/17 (!) 348 lb 3.2 oz (157.9 kg)  05/17/17 (!) 348 lb 3.2 oz (157.9 kg)  04/23/17 (!) 348 lb 3.2 oz (157.9 kg)  04/15/17 (!) 348 lb 3.2 oz (157.9 kg)  03/28/17 (!) 348 lb (157.9 kg)  03/27/17 (!) 348 lb 3.2 oz (157.9 kg)  03/11/17 (!) 348 lb 3.2 oz (157.9 kg)  03/05/17 (!) 348 lb 4.8 oz (158 kg)  02/13/17 (!) 348 lb 3 oz (157.9 kg)  02/08/17 (!) 348 lb 3.2 oz (157.9 kg)  01/25/17 (!) 346 lb (156.9 kg)  01/09/17 (!) 346 lb (156.9 kg)  12/12/16 (!) 346 lb (156.9 kg)    Body mass index is 46.79 kg/m. Patient meets criteria for morbid obesity based on current BMI.   Spoke with pt at bedside who reports having a great appetite prior to admission and no unintentional wt loss. Wt shows to be stable for the last 5 months. Labs and medications reviewed.   No nutrition interventions warranted at this time. If nutrition issues arise, please consult RD.   Mariana Single RD, LDN Clinical Nutrition Pager # [phone]

## 2017-05-29 LAB — CBC WITH DIFFERENTIAL/PLATELET
BASOS PCT: 1 %
Basophils Absolute: 0.1 10*3/uL (ref 0.0–0.1)
EOS ABS: 0.4 10*3/uL (ref 0.0–0.7)
Eosinophils Relative: 4 %
HCT: 30.6 % — ABNORMAL LOW (ref 39.0–52.0)
HEMOGLOBIN: 10.2 g/dL — AB (ref 13.0–17.0)
Lymphocytes Relative: 16 %
Lymphs Abs: 1.4 10*3/uL (ref 0.7–4.0)
MCH: 30 pg (ref 26.0–34.0)
MCHC: 33.3 g/dL (ref 30.0–36.0)
MCV: 90 fL (ref 78.0–100.0)
Monocytes Absolute: 0.6 10*3/uL (ref 0.1–1.0)
Monocytes Relative: 7 %
NEUTROS PCT: 72 %
Neutro Abs: 6.5 10*3/uL (ref 1.7–7.7)
Platelets: 242 10*3/uL (ref 150–400)
RBC: 3.4 MIL/uL — AB (ref 4.22–5.81)
RDW: 13.3 % (ref 11.5–15.5)
WBC: 8.9 10*3/uL (ref 4.0–10.5)

## 2017-05-29 LAB — GLUCOSE, CAPILLARY
GLUCOSE-CAPILLARY: 160 mg/dL — AB (ref 65–99)
GLUCOSE-CAPILLARY: 190 mg/dL — AB (ref 65–99)
GLUCOSE-CAPILLARY: 302 mg/dL — AB (ref 65–99)
GLUCOSE-CAPILLARY: 299 mg/dL — AB (ref 65–99)

## 2017-05-29 LAB — BASIC METABOLIC PANEL
Anion gap: 7 (ref 5–15)
BUN: 11 mg/dL (ref 6–20)
CHLORIDE: 102 mmol/L (ref 101–111)
CO2: 30 mmol/L (ref 22–32)
CREATININE: 0.84 mg/dL (ref 0.61–1.24)
Calcium: 8.1 mg/dL — ABNORMAL LOW (ref 8.9–10.3)
GFR calc non Af Amer: >60 mL/min (ref >60)
Glucose, Bld: 176 mg/dL — ABNORMAL HIGH (ref 65–99)
Potassium: 3.7 mmol/L (ref 3.5–5.1)
SODIUM: 139 mmol/L (ref 135–145)

## 2017-05-29 LAB — MAGNESIUM: MAGNESIUM: 1.8 mg/dL (ref 1.7–2.4)

## 2017-05-29 NOTE — Evaluation (Signed)
Physical Therapy Evaluation Patient Details Name: [name] MRN: [id number] DOB: 05-04-1946 Today's Date: 05/29/2017   History of Present Illness  Pt admitted for cellulitis and placement of urinary catheter.  PW with hx of CVAs, DM, diabetic neuropathy, CHF, CAD, R TKR, neurogenic bladder and syncopal episodes  Clinical Impression  Pt admitted as above and requiring min assist of 2 to transfer sit<>supine but able to balance at EOB with supervision only.  Pt experiencing multiple brief syncopal type episodes during session but seems unaware of same until told about them.  BP taken in bedside sitting - 119/89 and taken again during episode - 99/69 - RN aware.  Pt states this is his baseline and SNF staff utilizes mechanical lift equipment for all transfers 2* pt size, neuropathy and syncopal episodes.  Pt states he is no longer receiving therapies at SNF.  Pt is not currently a good PT candidate and will dc services at this time.    Follow Up Recommendations SNF    Equipment Recommendations  None recommended by PT    Recommendations for Other Services       Precautions / Restrictions Precautions Precautions: Fall Precaution Comments: Syncopal episodes Restrictions Weight Bearing Restrictions: No      Mobility  Bed Mobility Overal bed mobility: Needs Assistance Bed Mobility: Supine to Sit;Sit to Supine     Supine to sit: Min assist;+2 for physical assistance;+2 for safety/equipment;HOB elevated Sit to supine: Min assist;+2 for physical assistance;+2 for safety/equipment;HOB elevated   General bed mobility comments: Increased time and pt utilizing bed rails to self assist  Transfers                 General transfer comment: NT 2* multiple brief syncopal type episodes during session.  Ambulation/Gait             General Gait Details: Pt states non-ambulatory at SNF for over a year  Stairs            Wheelchair Mobility    Modified Rankin (Stroke  Patients Only)       Balance Overall balance assessment: Needs assistance Sitting-balance support: No upper extremity supported;Feet supported Sitting balance-Leahy Scale: Good                                       Pertinent Vitals/Pain Pain Assessment: No/denies pain    Home Living Family/patient expects to be discharged to:: Skilled nursing facility                 Additional Comments: Pt reports he no longer receives therapy    Prior Function Level of Independence: Needs assistance   Gait / Transfers Assistance Needed: non-ambulatory     Comments: Pt reports OOB with lift only 2* syncopal episodes     Hand Dominance   Dominant Hand: Right    Extremity/Trunk Assessment   Upper Extremity Assessment Upper Extremity Assessment: Generalized weakness    Lower Extremity Assessment Lower Extremity Assessment: Generalized weakness       Communication   Communication: No difficulties  Cognition Arousal/Alertness: Awake/alert Behavior During Therapy: WFL for tasks assessed/performed Overall Cognitive Status: Within Functional Limits for tasks assessed                                        General Comments  Exercises     Assessment/Plan    PT Assessment    PT Problem List         PT Treatment Interventions      PT Goals (Current goals can be found in the Care Plan section)  Acute Rehab PT Goals Patient Stated Goal: No goal stated - pt states he is at baseline    Frequency     Barriers to discharge        Co-evaluation               AM-PAC PT "6 Clicks" Daily Activity  Outcome Measure Difficulty turning over in bed (including adjusting bedclothes, sheets and blankets)?: Unable Difficulty moving from lying on back to sitting on the side of the bed? : Unable Difficulty sitting down on and standing up from a chair with arms (e.g., wheelchair, bedside commode, etc,.)?: Unable Help needed moving to  and from a bed to chair (including a wheelchair)?: Total Help needed walking in hospital room?: Total Help needed climbing 3-5 steps with a railing? : Total 6 Click Score: 6    End of Session   Activity Tolerance: Patient tolerated treatment well Patient left: in bed;with call bell/phone within reach Nurse Communication: Mobility status;Need for lift equipment PT Visit Diagnosis: Muscle weakness (generalized) (M62.81);Difficulty in walking, not elsewhere classified (R26.2)    Time: 6803-2122 PT Time Calculation (min) (ACUTE ONLY): 30 min   Charges:   PT Evaluation $PT Eval Low Complexity: 1 Low PT Treatments $Therapeutic Activity: 8-22 mins   PT G Codes:        Pg [phone]   Apryll Hinkle 05/29/2017, 2:13 PM

## 2017-05-29 NOTE — Progress Notes (Signed)
PROGRESS NOTE    [name]  JSH:[id number] DOB: March 22, 1946 DOA: 05/27/2017 PCP: Gildardo Cranker, DO    Brief Narrative: [name] 71 y.o.malewith medical history significant of indwelling catheter, hypertension, hyperlipidemia, diabetes mellitus, stroke, GERD, depression, OSA not on CPAP, obesity, DVT 1980, dCHF, CAD, bullous pemphigus, who presents with fever, left hip redness and pain, urinary retention, dysuria. He was brought from his skilled nursing facility for request of Foley replacement. He has history of urinary retention and has [name] chronic indwelling catheter. Over the past 45 years. Facility was unable to replace it, which brought him to the emergency department on 9/17. He also has left hip redness and pain which had been ongoing for several days, reported fevers and chills. Patient was admitted for left-sided leg cellulitis as well as catheter related UTI.   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 No focal deficits, signs of stroke or seizure -?Etiology, perhaps vasovagal response. Patient nonambulatory at baseline  -Treat infection as above  Nonsustained Vtach  - 15 beats seen on tele on 9/19 - ctm on telemetry - replete electrolytes  Hypokalemia -Replace, trend   Chronic diastolic heart failure -Appears euvolemic -Continue metoprolol  Abdominal pain, nausea, vomiting -CT abdomen pelvis unremarkable for acute pathology to explain abdominal pain -No further episodes -Advance diet to clears today  Moisture associated skin damage along bilateral buttocks -Wound RN consulted   Uncontrolled type 2 diabetes with peripheral autonomic neuropathy -Lantus, sliding scale insulin -HA1c 9.8 -Neurontin   History of CVA -Continue aspirin, zetia  Bullous pemphigus -Continue prednisone, methotrexate  HTN -Continue norvasc. Lopressor    DVT prophylaxis: lovenox Code Status:full  Family Communication: none at bedside Disposition Plan: to snf once improving  Consultants:   none  Procedures: (Don't include imaging studies which can be auto populated. Include things that cannot be auto populated i.e. Echo, Carotid and venous dopplers, Foley, Bipap, HD, tubes/drains, wound vac, central lines etc)  none  Antimicrobials: (specify start and planned stop date. Auto populated tables are space occupying and do not give end dates) Anti-infectives    Start     Dose/Rate Route Frequency Ordered Stop   05/28/17 1000  vancomycin (VANCOCIN) 1,250 mg in sodium chloride 0.9 % 250 mL IVPB     1,250 mg 166.7 mL/hr over 90 Minutes Intravenous Every 12 hours 05/27/17 2026     05/28/17 1000  piperacillin-tazobactam (ZOSYN) IVPB  3.375 g     3.375 g 12.5 mL/hr over 240 Minutes Intravenous Every 8 hours  05/28/17 0936     05/27/17 2030  cefTRIAXone (ROCEPHIN) 2 g in dextrose 5 % 50 mL IVPB  Status:  Discontinued     2 g 100 mL/hr over 30 Minutes Intravenous Every 24 hours 05/27/17 2002 05/28/17 0936   05/27/17 2000  vancomycin (VANCOCIN) 2,500 mg in sodium chloride 0.9 % 500 mL IVPB     2,500 mg 250 mL/hr over 120 Minutes Intravenous  Once 05/27/17 1946 05/27/17 2241   05/27/17 1930  cefTRIAXone (ROCEPHIN) 1 g in dextrose 5 % 50 mL IVPB  Status:  Discontinued     1 g 100 mL/hr over 30 Minutes Intravenous  Once 05/27/17 1928 05/27/17 2017         Subjective: Tells me he's here for UTI.  I have to remind him of the cellulitis.  [name]&Ox3.   Objective: Vitals:   05/27/17 2209 05/28/17 0604 05/28/17 2259 05/29/17 0626  BP: 102/72 107/74 122/84 130/73  Pulse: 90 89 69 65  Resp: 20 20 18 20   Temp: 99.3 F (37.4 C) 98.6 F (37 C) 98.5 F (36.9 C)   TempSrc: Oral Oral Oral Oral  SpO2: 99% 93% 97% 97%  Weight: (!) 165.8 kg (365 lb 8.4 oz) (!) 165.3 kg (364 lb 6.7 oz)  (!) 164.5 kg (362 lb 10.5 oz)  Height: 6\' 2"  (1.88 m)       Intake/Output Summary (Last 24 hours) at 05/29/17 1232 Last data filed at 05/29/17 0900  Gross per 24 hour  Intake              920 ml  Output             2150 ml  Net            -1230 ml   Filed Weights   05/27/17 2209 05/28/17 0604 05/29/17 0626  Weight: (!) 165.8 kg (365 lb 8.4 oz) (!) 165.3 kg (364 lb 6.7 oz) (!) 164.5 kg (362 lb 10.5 oz)    Examination:  General exam: Appears calm and comfortable  Respiratory system: Clear to auscultation. Respiratory effort normal. Cardiovascular system: S1 & S2 heard, RRR. No JVD, murmurs, rubs, gallops or clicks. No pedal edema. Gastrointestinal system: Abdomen is protuberant, mildly tender diffusely, soft and nontender. No organomegaly or masses felt. Normal bowel sounds heard. Central nervous system: Alert and oriented. No focal neurological deficits. Extremities: L hip/thigh with large area of erythema within  marker border Skin: Sacrum not examined today (moisture related skin damage noted ysterday) Psychiatry: Judgement and insight appear normal. Mood & affect appropriate.     Data Reviewed: I have personally reviewed following labs and imaging studies  CBC:  Recent Labs Lab 05/27/17 1739 05/28/17 0503 05/29/17 0432  WBC 17.5* 13.3* 8.9  NEUTROABS  --   --  6.5  HGB 11.8* 11.5* 10.2*  HCT 35.8* 34.9* 30.6*  MCV 90.2 88.4 90.0  PLT 271 252 476   Basic Metabolic Panel:  Recent Labs Lab 05/27/17 1739 05/28/17 0503 05/29/17 0432  NA 135 135 139  K 3.6 3.2* 3.7  CL 95* 98* 102  CO2 31 28 30   GLUCOSE 222* 208* 176*  BUN 16 14 11   CREATININE 0.95 0.87 0.84  CALCIUM 8.4* 8.1* 8.1*   GFR: Estimated Creatinine Clearance: 135.1 mL/min (by C-G formula based on SCr of 0.84 mg/dL). Liver Function Tests: No results for input(s): AST, ALT, ALKPHOS,  BILITOT, PROT, ALBUMIN in the last 168 hours.  Recent Labs Lab 05/27/17 2028  LIPASE 15   No results for input(s): AMMONIA in the last 168 hours. Coagulation Profile:  Recent Labs Lab 05/27/17 2028  INR 1.08   Cardiac Enzymes: No results for input(s): CKTOTAL, CKMB, CKMBINDEX, TROPONINI in the last 168 hours. BNP (last 3 results) No results for input(s): PROBNP in the last 8760 hours. HbA1C: No results for input(s): HGBA1C in the last 72 hours. CBG:  Recent Labs Lab 05/28/17 1157 05/28/17 1701 05/28/17 2252 05/29/17 0819 05/29/17 1208  GLUCAP 166* 164* 238* 160* 190*   Lipid Profile: No results for input(s): CHOL, HDL, LDLCALC, TRIG, CHOLHDL, LDLDIRECT in the last 72 hours. Thyroid Function Tests: No results for input(s): TSH, T4TOTAL, FREET4, T3FREE, THYROIDAB in the last 72 hours. Anemia Panel: No results for input(s): VITAMINB12, FOLATE, FERRITIN, TIBC, IRON, RETICCTPCT in the last 72 hours. Sepsis Labs:  Recent Labs Lab 05/27/17 2028 05/27/17 2029 05/27/17 2242  PROCALCITON 0.13  --   --   LATICACIDVEN   --  1.1 1.3    Recent Results (from the past 240 hour(s))  Urine culture     Status: Abnormal (Preliminary result)   Collection Time: 05/27/17  7:20 PM  Result Value Ref Range Status   Specimen Description URINE, CLEAN CATCH  Final   Special Requests NONE  Final   Culture >=100,000 COLONIES/mL ESCHERICHIA COLI (Evanthia Maund)  Final   Report Status PENDING  Incomplete  Blood culture (routine x 2)     Status: None (Preliminary result)   Collection Time: 05/27/17  8:17 PM  Result Value Ref Range Status   Specimen Description BLOOD RIGHT HAND  Final   Special Requests   Final    BOTTLES DRAWN AEROBIC AND ANAEROBIC Blood Culture adequate volume   Culture   Final    NO GROWTH < 24 HOURS Performed at [facility], [place] [address], [place], [place] [address]    Report Status PENDING  Incomplete  Blood culture (routine x 2)     Status: None (Preliminary result)   Collection Time: 05/27/17  8:28 PM  Result Value Ref Range Status   Specimen Description BLOOD LEFT ANTECUBITAL  Final   Special Requests IN PEDIATRIC BOTTLE Blood Culture adequate volume  Final   Culture   Final    NO GROWTH < 24 HOURS Performed at [facility], [place] [address], [place],  [address]    Report Status PENDING  Incomplete  MRSA PCR Screening     Status: Abnormal   Collection Time: 05/27/17 11:35 PM  Result Value Ref Range Status   MRSA by PCR POSITIVE (Adrian Dinovo) NEGATIVE Final    Comment:        The GeneXpert MRSA Assay (FDA approved for NASAL specimens only), is one component of Britt Petroni comprehensive MRSA colonization surveillance program. It is not intended to diagnose MRSA infection nor to guide or monitor treatment for MRSA infections. RESULT CALLED TO, READ BACK BY AND VERIFIED WITH: B HOLT RN 0422 05/28/17 Tyden Kann [facility]          Radiology Studies: Ct Hip Left W Contrast  Result Date: 05/28/2017 CLINICAL DATA:  Left hip pain, swelling and redness for several days. EXAM: CT OF THE LOWER LEFT EXTREMITY  WITH CONTRAST TECHNIQUE: Multidetector CT imaging of the lower left extremity was performed according to the standard protocol following intravenous contrast administration. COMPARISON:  05/27/2017 CONTRAST:  125mL ISOVUE-300 IOPAMIDOL (ISOVUE-300) INJECTION 61% FINDINGS:  Moderate left hip joint degenerative changes with joint space narrowing, osteophytic spurring and subchondral cystic change. No fracture or AVN is demonstrated. No obvious hip joint effusion or destructive bony changes to suggest septic arthritis. Severe right hip joint degenerative changes are noted. Borderline left inguinal lymph nodes are noted. No inguinal mass or hernia. The major vascular structures appear patent. No significant intrapelvic abnormalities are identified. Air in the bladder from Foley catheter. The visualized bony pelvis is intact. No obvious sacral or pubic rami fractures. Artifact is noted from the patient's skin touching the gantry. There is mild skin thickening but I do not see any significant subcutaneous abnormalities. No abscess. No findings for myofasciitis or pyomyositis. No evidence of osteomyelitis. IMPRESSION: 1. Bilateral hip joint degenerative changes, right greater than left but no hip fracture, AVN or findings to suggest septic arthritis. 2. Significant artifact from the patient's skin touching the gantry. Suspect mild cellulitis but no obvious abscess, myofasciitis or pyomyositis. Electronically Signed   By: Marijo Sanes M.D.   On: 05/28/2017 16:14   Ct Abdomen Pelvis W Contrast  Result Date: 05/27/2017 CLINICAL DATA:  Indwelling catheter fever in left hip redness and pain, urinary retention EXAM: CT ABDOMEN AND PELVIS WITH CONTRAST TECHNIQUE: Multidetector CT imaging of the abdomen and pelvis was performed using the standard protocol following bolus administration of intravenous contrast. CONTRAST:  138mL ISOVUE-300 IOPAMIDOL (ISOVUE-300) INJECTION 61% COMPARISON:  05/12/2015 FINDINGS: Lower chest: Lung  bases demonstrate no acute consolidation or pleural effusion. Linear atelectasis or scar at the right base. Borderline enlarged heart. Aortic and mitral calcifications. Hepatobiliary: Surgical clips at the gallbladder fossa. No focal hepatic abnormality or biliary dilatation Pancreas: Unremarkable. No pancreatic ductal dilatation or surrounding inflammatory changes. Spleen: Normal in size without focal abnormality. Adrenals/Urinary Tract: Right adrenal gland normal. 2.3 cm fat density mass in the left adrenal gland consistent with myelolipoma. Subcentimeter hypodensities in both kidneys too small to characterize. Negative for hydronephrosis. Slight decreased density of left kidney but excretion present on delayed images. Foley catheter is present with the balloon in the bladder base. Bladder is empty. The tip of the Foley catheter tents the superior wall of the bladder. Stomach/Bowel: Postsurgical changes of the stomach. Multiple surgical clips in the gastrosplenic region. No dilated small bowel. No colon wall thickening. Normal appendix. Vascular/Lymphatic: Aortic atherosclerosis. No enlarged abdominal or pelvic lymph nodes. Reproductive: No masses Other: Negative for free air or free fluid. Musculoskeletal: Degenerative changes. Grade 1 anterolisthesis of L4 on L5. Chronic wedging deformities at T10, T11 and T12. Left hip is incompletely included in the field of view. IMPRESSION: 1. Negative for hydronephrosis. Nonspecific perinephric fat stranding. Foley catheter is present in the bladder which is empty, the tip of the catheter appears to tent the superior wall of the bladder. 2. There are no acute intra-abdominal or pelvic abnormalities seen. 3. 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   On: 05/27/2017 21:32        Scheduled Meds: . amLODipine  5 mg Oral Daily  . aspirin EC  81 mg Oral Daily  . chlorhexidine  15 mL Mouth Rinse BID  . Chlorhexidine  Gluconate Cloth  6 each Topical Q0600  . clotrimazole   Topical BID  . enoxaparin (LOVENOX) injection  80 mg Subcutaneous QHS  . escitalopram  10 mg Oral Daily  . ezetimibe  10 mg Oral Daily  . folic acid  1 mg Oral Daily  . gabapentin  100  mg Oral BID  . guaiFENesin  600 mg Oral BID  . hydrocerin   Topical BID  . hydrOXYzine  25 mg Oral TID  . insulin aspart  0-9 Units Subcutaneous TID WC  . insulin glargine  28 Units Subcutaneous QHS  . mouth rinse  15 mL Mouth Rinse q12n4p  . metoprolol tartrate  12.5 mg Oral BID  . multivitamin with minerals  1 tablet Oral Q1500  . mupirocin ointment  1 application Nasal BID  . nystatin   Topical BID  . pantoprazole  40 mg Oral Daily  . predniSONE  10 mg Oral Daily  . rOPINIRole  0.5 mg Oral QHS  . senna-docusate  2 tablet Oral QHS  . traZODone  50 mg Oral QHS   Continuous Infusions: . piperacillin-tazobactam (ZOSYN)  IV Stopped (05/29/17 1025)  . vancomycin Stopped (05/29/17 1130)     LOS: 2 days    Time spent: 25 min    Fayrene Helper, MD [facility]

## 2017-05-30 DIAGNOSIS — R55 Syncope and collapse: Secondary | ICD-10-CM

## 2017-05-30 LAB — GLUCOSE, CAPILLARY
GLUCOSE-CAPILLARY: 130 mg/dL — AB (ref 65–99)
GLUCOSE-CAPILLARY: 168 mg/dL — AB (ref 65–99)
GLUCOSE-CAPILLARY: 208 mg/dL — AB (ref 65–99)
Glucose-Capillary: 231 mg/dL — ABNORMAL HIGH (ref 65–99)
Glucose-Capillary: 98 mg/dL (ref 65–99)

## 2017-05-30 LAB — CBC
HEMATOCRIT: 35 % — AB (ref 39.0–52.0)
HEMOGLOBIN: 11.4 g/dL — AB (ref 13.0–17.0)
MCH: 28.9 pg (ref 26.0–34.0)
MCHC: 32.6 g/dL (ref 30.0–36.0)
MCV: 88.6 fL (ref 78.0–100.0)
Platelets: 279 10*3/uL (ref 150–400)
RBC: 3.95 MIL/uL — ABNORMAL LOW (ref 4.22–5.81)
RDW: 13.1 % (ref 11.5–15.5)
WBC: 7.9 10*3/uL (ref 4.0–10.5)

## 2017-05-30 LAB — BASIC METABOLIC PANEL
ANION GAP: 7 (ref 5–15)
BUN: 7 mg/dL (ref 6–20)
CALCIUM: 8.2 mg/dL — AB (ref 8.9–10.3)
CHLORIDE: 100 mmol/L — AB (ref 101–111)
CO2: 27 mmol/L (ref 22–32)
Creatinine, Ser: 0.74 mg/dL (ref 0.61–1.24)
GFR calc non Af Amer: >60 mL/min (ref >60)
Glucose, Bld: 175 mg/dL — ABNORMAL HIGH (ref 65–99)
Potassium: 3.5 mmol/L (ref 3.5–5.1)
SODIUM: 134 mmol/L — AB (ref 135–145)

## 2017-05-30 LAB — URINE CULTURE

## 2017-05-30 LAB — VANCOMYCIN, TROUGH: VANCOMYCIN TR: 22 ug/mL — AB (ref 15–20)

## 2017-05-30 MED ORDER — VANCOMYCIN HCL IN DEXTROSE 750-5 MG/150ML-% IV SOLN
750.0000 mg | Freq: Two times a day (BID) | INTRAVENOUS | Status: DC
  Administered 2017-05-31 – 2017-06-03 (×7): 750 mg via INTRAVENOUS
  Filled 2017-05-30 (×8): qty 150

## 2017-05-30 NOTE — Care Management Important Message (Addendum)
Important Message  Patient Details IM Letter given to Cookie/Case Manager to present to Patient. Name: [name] MRN: [id number] Date of Birth: 04-10-1946   Medicare Important Message Given:  Yes    Kerin Salen 05/30/2017, 11:25 AMImportant Message  Patient Details  Name: [name] MRN: [id number] Date of Birth: [birth year]   Medicare Important Message Given:  Yes    Kerin Salen 05/30/2017, 11:25 AM

## 2017-05-30 NOTE — Progress Notes (Signed)
Inpatient Diabetes Program Recommendations  AACE/ADA: New Consensus Statement on Inpatient Glycemic Control (2015)  Target Ranges:  Prepandial:   less than 140 mg/dL      Peak postprandial:   less than 180 mg/dL (1-2 hours)      Critically ill patients:  140 - 180 mg/dL   Lab Results  Component Value Date   GLUCAP 168 (H) 05/30/2017   HGBA1C 9.8 05/08/2017    Review of Glycemic Control  Post-prandial blood sugars elevated. Needs meal coverage insulin.  Inpatient Diabetes Program Recommendations:   Add Novolog 4 units tidwc for meal coverage insulin. Titrate if CBGs remain > 180 mg/dL.  Will follow. Thank you. Lorenda Peck, RD, LDN, CDE Inpatient Diabetes Coordinator [phone]

## 2017-05-30 NOTE — Progress Notes (Signed)
Pharmacy Antibiotic Note  [name] is a 71 y.o. obese male admitted on 05/27/2017 with left hip cellulitis. No abscess noted on hip CT. Patient's currently on vancomycin and zosyn for infection.    Today, 05/30/2017: - day #3 abx - afeb, wbc down wnl, scr 0.74 (crcl~81N) -  PCT 0.13, CRP 17.1, sed rate 48 - ucx with ESBL ecoli - vancomycin trough level now back elevated at 22 (goal 10-15)  Plan: - reduce vancomycin dose to 750 mg IV q12h - continue zosyn 3.375 gm IV q8h (infuse over 4 hrs) - monitor renal function closely  ________________________________________  Height: 6\' 2"  (188 cm) Weight: (!) 362 lb 10.5 oz (164.5 kg) IBW/kg (Calculated) : 82.2  Temp (24hrs), Avg:98.1 F (36.7 C), Min:98.1 F (36.7 C), Max:98.1 F (36.7 C)   Recent Labs Lab 05/27/17 1739 05/27/17 2029 05/27/17 2242 05/28/17 0503 05/29/17 0432 05/30/17 0425 05/30/17 0942  WBC 17.5*  --   --  13.3* 8.9 7.9  --   CREATININE 0.95  --   --  0.87 0.84 0.74  --   LATICACIDVEN  --  1.1 1.3  --   --   --   --   VANCOTROUGH  --   --   --   --   --   --  22*    Estimated Creatinine Clearance: 141.9 mL/min (by C-G formula based on SCr of 0.74 mg/dL).    Allergies  Allergen Reactions  . Ace Inhibitors Swelling    Pt tolerates lisinopril  . Lipitor [Atorvastatin Calcium] Swelling  . Metformin And Related Swelling  . Cefadroxil Hives  . Cephalexin Hives  . Morphine And Related Other (See Comments)    Sweating, feels like is "in rocky boat."  . Robaxin [Methocarbamol] Other (See Comments)    Feels like he is shaky    Antimicrobials this admission: 9/17 ceftriaxone >> 9/17 9/17 vancomycin >> 9/17 zosyn>>  Dose adjustments this admission: 9/20 VT at 0930= 22 (1250 mg q12h)--> change to 750 mg qq12h (est tr 14)  Microbiology results: 9/17 MRSA PCR (+) on chlx and bactroban 9/17 bcx x2: 9/17 ucx:>100K ecoli ESBL (S= gent, imi, nitro, bactrim, zosyn) Thank you for allowing pharmacy to be  a part of this patient's care.  Lynelle Doctor 05/30/2017 1:17 PM

## 2017-05-30 NOTE — Progress Notes (Signed)
PROGRESS NOTE    [name]  WEX:[id number] DOB: 10-16-1945 DOA: 05/27/2017 PCP: Gildardo Cranker, DO    Brief Narrative: [name] [name] 71 y.o.malewith medical history significant of indwelling catheter, hypertension, hyperlipidemia, diabetes mellitus, stroke, GERD, depression, OSA not on CPAP, obesity, DVT 1980, dCHF, CAD, bullous pemphigus, who presents with fever, left hip redness and pain, urinary retention, dysuria. He was brought from his skilled nursing facility for request of Foley replacement. He has history of urinary retention and has Jourden Delmont chronic indwelling catheter. Over the past 45 years. Facility was unable to replace it, which brought him to the emergency department on 9/17. He also has left hip redness and pain which had been ongoing for several days, reported fevers and chills. Patient was admitted for left-sided leg cellulitis as well as catheter related UTI.   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  -Chronic Foley in place (exchanged as noted below) -Empiric Vanco and Zosyn  -Blood cultures NGTD x 2 days -Urine culture pending -> e. 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 No focal deficits, signs of stroke or seizure -?Etiology, perhaps vasovagal response. Patient nonambulatory at baseline  [ ]  repeat echo -Treat infection as above  Nonsustained Vtach  - 15 beats seen on tele on 9/19, sinus PVC reported 9/20 - ctm on telemetry - replete electrolytes  Hypokalemia -Replace, trend   Chronic diastolic heart failure -Appears euvolemic -Continue metoprolol  Abdominal pain, nausea, vomiting -CT abdomen pelvis unremarkable for acute pathology to explain abdominal pain -No further episodes -Advance diet to clears today  Moisture associated skin damage along bilateral buttocks -Wound RN consulted   Uncontrolled type 2 diabetes with peripheral autonomic neuropathy -Lantus, sliding scale insulin -HA1c 9.8 -Neurontin   History of CVA -Continue aspirin, zetia  Bullous pemphigus -Continue prednisone, methotrexate  HTN -Continue norvasc. Lopressor    DVT prophylaxis: lovenox Code Status:full  Family Communication: none at bedside Disposition Plan: to snf once improving  Consultants:   none  Procedures: (Don't include imaging studies which can be auto populated. Include things that cannot be auto populated i.e. Echo, Carotid and venous dopplers, Foley, Bipap, HD, tubes/drains, wound vac, central lines etc)  none  Antimicrobials: (specify start and planned stop date. Auto populated tables are space occupying and do not give end dates) Anti-infectives    Start     Dose/Rate Route Frequency Ordered Stop   05/28/17 1000  vancomycin (VANCOCIN) 1,250 mg in sodium chloride 0.9 % 250 mL  IVPB     1,250 mg 166.7 mL/hr over 90 Minutes Intravenous Every 12 hours 05/27/17 2026     05/28/17  1000  piperacillin-tazobactam (ZOSYN) IVPB 3.375 g     3.375 g 12.5 mL/hr over 240 Minutes Intravenous Every 8 hours 05/28/17 0936     05/27/17 2030  cefTRIAXone (ROCEPHIN) 2 g in dextrose 5 % 50 mL IVPB  Status:  Discontinued     2 g 100 mL/hr over 30 Minutes Intravenous Every 24 hours 05/27/17 2002 05/28/17 0936   05/27/17 2000  vancomycin (VANCOCIN) 2,500 mg in sodium chloride 0.9 % 500 mL IVPB     2,500 mg 250 mL/hr over 120 Minutes Intravenous  Once 05/27/17 1946 05/27/17 2241   05/27/17 1930  cefTRIAXone (ROCEPHIN) 1 g in dextrose 5 % 50 mL IVPB  Status:  Discontinued     1 g 100 mL/hr over 30 Minutes Intravenous  Once 05/27/17 1928 05/27/17 2017         Subjective: Feels like hip is better. Otherwise, feeling ok.  No CP or SOB.   Objective: Vitals:   05/29/17 0626 05/29/17 1539 05/29/17 2115 05/30/17 0509  BP: 130/73 116/76 115/74 105/67  Pulse: 65 65 63 63  Resp: 20 18    Temp:  98.1 F (36.7 C)    TempSrc: Oral Oral    SpO2: 97% 97% 98% 98%  Weight: (!) 164.5 kg (362 lb 10.5 oz)     Height:        Intake/Output Summary (Last 24 hours) at 05/30/17 1149 Last data filed at 05/30/17 1031  Gross per 24 hour  Intake              880 ml  Output            13975 ml  Net           -13095 ml   Filed Weights   05/27/17 2209 05/28/17 0604 05/29/17 0626  Weight: (!) 165.8 kg (365 lb 8.4 oz) (!) 165.3 kg (364 lb 6.7 oz) (!) 164.5 kg (362 lb 10.5 oz)    Examination:  General: No acute distress. Cardiovascular: Heart sounds show Greydon Betke regular rate, and rhythm. No gallops or rubs. No murmurs. No JVD. Lungs: Clear to auscultation bilaterally with good air movement. No rales, rhonchi or wheezes. Abdomen: Soft, nontender, nondistended with normal active bowel sounds. No masses. No hepatosplenomegaly. Neurological: Alert and oriented 3. Moves all extremities 4 with equal strength. Cranial nerves II through XII grossly intact. Skin: Erythema, induration, and TTP of L hip  tracking towards buttocks, appears all within previous borders.  Sacrum not well visualized due to body habitus (pt rotated to L and R side) Extremities: No clubbing or cyanosis. 1+ edema bilaterally. Pedal pulses 2+. Psychiatric: Mood and affect are normal. Insight and judgment are appropriate.    Data Reviewed: I have personally reviewed following labs and imaging studies  CBC:  Recent Labs Lab 05/27/17 1739 05/28/17 0503 05/29/17 0432 05/30/17 0425  WBC 17.5* 13.3* 8.9 7.9  NEUTROABS  --   --  6.5  --   HGB 11.8* 11.5* 10.2* 11.4*  HCT 35.8* 34.9* 30.6* 35.0*  MCV 90.2 88.4 90.0 88.6  PLT 271 252 242 973   Basic Metabolic Panel:  Recent Labs Lab 05/27/17 1739 05/28/17 0503 05/29/17 0432 05/29/17 1417 05/30/17 0425  NA 135 135 139  --  134*  K 3.6 3.2* 3.7  --  3.5  CL 95* 98* 102  --  100*  CO2 31 28 30   --  27  GLUCOSE 222* 208* 176*  --  175*  BUN 16 14 11   --  7  CREATININE 0.95 0.87 0.84  --  0.74  CALCIUM 8.4* 8.1* 8.1*  --  8.2*  MG  --   --   --  1.8  --    GFR: Estimated Creatinine Clearance: 141.9 mL/min (by C-G formula based on SCr of 0.74 mg/dL). Liver Function Tests: No results for input(s): AST, ALT, ALKPHOS, BILITOT, PROT, ALBUMIN in the last 168 hours.  Recent Labs Lab 05/27/17 2028  LIPASE 15   No results for input(s): AMMONIA in the last 168 hours. Coagulation Profile:  Recent Labs Lab 05/27/17 2028  INR 1.08   Cardiac Enzymes: No results for input(s): CKTOTAL, CKMB, CKMBINDEX, TROPONINI in the last 168 hours. BNP (last 3 results) No results for input(s): PROBNP in the last 8760 hours. HbA1C: No results for input(s): HGBA1C in the last 72 hours. CBG:  Recent Labs Lab 05/29/17 1208 05/29/17 1802 05/29/17 2112 05/30/17 0031 05/30/17 0810  GLUCAP 190* 302* 299* 208* 130*   Lipid Profile: No results for input(s): CHOL, HDL, LDLCALC, TRIG, CHOLHDL, LDLDIRECT in the last 72 hours. Thyroid Function Tests: No results for  input(s): TSH, T4TOTAL, FREET4, T3FREE, THYROIDAB in the last 72 hours. Anemia Panel: No results for input(s): VITAMINB12, FOLATE, FERRITIN, TIBC, IRON, RETICCTPCT in the last 72 hours. Sepsis Labs:  Recent Labs Lab 05/27/17 2028 05/27/17 2029 05/27/17 2242  PROCALCITON 0.13  --   --   LATICACIDVEN  --  1.1 1.3    Recent Results (from the past 240 hour(s))  Urine culture     Status: Abnormal   Collection Time: 05/27/17  7:20 PM  Result Value Ref Range Status   Specimen Description URINE, CLEAN CATCH  Final   Special Requests NONE  Final   Culture (Gerrod Maule)  Final    >=100,000 COLONIES/mL ESCHERICHIA COLI Confirmed Extended Spectrum Beta-Lactamase Producer (ESBL) Performed at [facility], [place] [address], [place], [place] [address]    Report Status 05/30/2017 FINAL  Final   Organism ID, Bacteria ESCHERICHIA COLI (Akaya Proffit)  Final      Susceptibility   Escherichia coli - MIC*    AMPICILLIN >=32 RESISTANT Resistant     CEFAZOLIN >=64 RESISTANT Resistant     CEFTRIAXONE >=64 RESISTANT Resistant     CIPROFLOXACIN >=4 RESISTANT Resistant     GENTAMICIN <=1 SENSITIVE Sensitive     IMIPENEM <=0.25 SENSITIVE Sensitive     NITROFURANTOIN <=16 SENSITIVE Sensitive     TRIMETH/SULFA <=20 SENSITIVE Sensitive     AMPICILLIN/SULBACTAM >=32 RESISTANT Resistant     PIP/TAZO 8 SENSITIVE Sensitive     Extended ESBL POSITIVE Resistant     * >=100,000 COLONIES/mL ESCHERICHIA COLI  Blood culture (routine x 2)     Status: None (Preliminary result)   Collection Time: 05/27/17  8:17 PM  Result Value Ref Range Status   Specimen Description BLOOD RIGHT HAND  Final   Special Requests   Final    BOTTLES DRAWN AEROBIC AND ANAEROBIC Blood Culture adequate volume   Culture   Final    NO GROWTH 2 DAYS Performed at [facility], [place] [address] [place], [place] [address]    Report Status PENDING  Incomplete  Blood culture (routine x 2)     Status: None (Preliminary result)   Collection Time:  05/27/17  8:28 PM  Result Value Ref Range  Status   Specimen Description BLOOD LEFT ANTECUBITAL  Final   Special Requests IN PEDIATRIC BOTTLE Blood Culture adequate volume  Final   Culture   Final    NO GROWTH 2 DAYS Performed at [facility], [address], [place],  [address]    Report Status PENDING  Incomplete  MRSA PCR Screening     Status: Abnormal   Collection Time: 05/27/17 11:35 PM  Result Value Ref Range Status   MRSA by PCR POSITIVE (Cassidi Modesitt) NEGATIVE Final    Comment:        The GeneXpert MRSA Assay (FDA approved for NASAL specimens only), is one component of Khaylee Mcevoy comprehensive MRSA colonization surveillance program. It is not intended to diagnose MRSA infection nor to guide or monitor treatment for MRSA infections. RESULT CALLED TO, READ BACK BY AND VERIFIED WITH: B HOLT RN 0422 05/28/17 Jerone Cudmore [place]          Radiology Studies: Ct Hip Left W Contrast  Result Date: 05/28/2017 CLINICAL DATA:  Left hip pain, swelling and redness for several days. EXAM: CT OF THE LOWER LEFT EXTREMITY WITH CONTRAST TECHNIQUE: Multidetector CT imaging of the lower left extremity was performed according to the standard protocol following intravenous contrast administration. COMPARISON:  05/27/2017 CONTRAST:  174mL ISOVUE-300 IOPAMIDOL (ISOVUE-300) INJECTION 61% FINDINGS: Moderate left hip joint degenerative changes with joint space narrowing, osteophytic spurring and subchondral cystic change. No fracture or AVN is demonstrated. No obvious hip joint effusion or destructive bony changes to suggest septic arthritis. Severe right hip joint degenerative changes are noted. Borderline left inguinal lymph nodes are noted. No inguinal mass or hernia. The major vascular structures appear patent. No significant intrapelvic abnormalities are identified. Air in the bladder from Foley catheter. The visualized bony pelvis is intact. No obvious sacral or pubic rami fractures. Artifact is noted from the  patient's skin touching the gantry. There is mild skin thickening but I do not see any significant subcutaneous abnormalities. No abscess. No findings for myofasciitis or pyomyositis. No evidence of osteomyelitis. IMPRESSION: 1. Bilateral hip joint degenerative changes, right greater than left but no hip fracture, AVN or findings to suggest septic arthritis. 2. Significant artifact from the patient's skin touching the gantry. Suspect mild cellulitis but no obvious abscess, myofasciitis or pyomyositis. Electronically Signed   By: Marijo Sanes M.D.   On: 05/28/2017 16:14        Scheduled Meds: . amLODipine  5 mg Oral Daily  . aspirin EC  81 mg Oral Daily  . chlorhexidine  15 mL Mouth Rinse BID  . Chlorhexidine Gluconate Cloth  6 each Topical Q0600  . clotrimazole   Topical BID  . enoxaparin (LOVENOX) injection  80 mg Subcutaneous QHS  . escitalopram  10 mg Oral Daily  . ezetimibe  10 mg Oral Daily  . folic acid  1 mg Oral Daily  . gabapentin  100 mg Oral BID  . guaiFENesin  600 mg Oral BID  . hydrocerin   Topical BID  . hydrOXYzine  25 mg Oral TID  . insulin aspart  0-9 Units Subcutaneous TID WC  . insulin glargine  28 Units Subcutaneous QHS  . mouth rinse  15 mL Mouth Rinse q12n4p  . metoprolol tartrate  12.5 mg Oral BID  . multivitamin with minerals  1 tablet Oral Q1500  . mupirocin ointment  1 application Nasal BID  . nystatin   Topical BID  . pantoprazole  40 mg Oral Daily  . predniSONE  10  mg Oral Daily  . rOPINIRole  0.5 mg Oral QHS  . senna-docusate  2 tablet Oral QHS  . traZODone  50 mg Oral QHS   Continuous Infusions: . piperacillin-tazobactam (ZOSYN)  IV Stopped (05/30/17 0944)  . vancomycin Stopped (05/30/17 1149)     LOS: 3 days    Time spent: 25 min    Fayrene Helper, MD [facility]

## 2017-05-31 ENCOUNTER — Inpatient Hospital Stay (HOSPITAL_COMMUNITY): Payer: Medicare Other

## 2017-05-31 DIAGNOSIS — I361 Nonrheumatic tricuspid (valve) insufficiency: Secondary | ICD-10-CM

## 2017-05-31 LAB — ECHOCARDIOGRAM COMPLETE
HEIGHTINCHES: 74 in
Weight: 5802.51 oz

## 2017-05-31 LAB — GLUCOSE, CAPILLARY
GLUCOSE-CAPILLARY: 236 mg/dL — AB (ref 65–99)
Glucose-Capillary: 82 mg/dL (ref 65–99)
Glucose-Capillary: 139 mg/dL — ABNORMAL HIGH (ref 65–99)
Glucose-Capillary: 309 mg/dL — ABNORMAL HIGH (ref 65–99)

## 2017-05-31 LAB — CREATININE, SERUM
Creatinine, Ser: 0.85 mg/dL (ref 0.61–1.24)
GFR calc non Af Amer: >60 mL/min (ref >60)

## 2017-05-31 MED ORDER — PERFLUTREN LIPID MICROSPHERE
1.0000 mL | INTRAVENOUS | Status: AC | PRN
  Administered 2017-05-31: 2 mL via INTRAVENOUS
  Filled 2017-05-31: qty 10

## 2017-05-31 MED ORDER — PERFLUTREN LIPID MICROSPHERE
INTRAVENOUS | Status: AC
Start: 2017-05-31 — End: 2017-05-31
  Filled 2017-05-31: qty 10

## 2017-05-31 NOTE — Progress Notes (Signed)
  Echocardiogram 2D Echocardiogram has been performed.  [name] 05/31/2017, 2:59 PM

## 2017-05-31 NOTE — Progress Notes (Signed)
PROGRESS NOTE    [name]  OYD:[id number] DOB: 08-Feb-1946 DOA: 05/27/2017 PCP: Brian Cranker, DO    Brief Narrative: [name] [name] [name] 71 y.o.malewith medical history significant of indwelling catheter, hypertension, hyperlipidemia, diabetes mellitus, stroke, GERD, depression, OSA not on CPAP, obesity, DVT 1980, dCHF, CAD, bullous pemphigus, who presents with fever, left hip redness and pain, urinary retention, dysuria. He was brought from his skilled nursing facility for request of Foley replacement. He has history of urinary retention and has [name] chronic indwelling catheter. Over the past 45 years. Facility was unable to replace it, which brought him to the emergency department on 9/17. He also has left hip redness and pain which had been ongoing for several days, reported fevers and chills. Patient was admitted for left-sided leg cellulitis as well as catheter related UTI.   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  Redness maybe improved, still extensive induration.  Within borders. -Chronic Foley in place (exchanged as noted below) -Empiric Vanco and Zosyn  -Blood cultures NGTD x 2 days -Urine culture pending -> e. 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 No focal deficits, signs of stroke or seizure -?Etiology, perhaps vasovagal response. Patient nonambulatory at baseline  [ ]  repeat echo -Treat infection as above  Nonsustained Vtach  - 15 beats seen on tele on 9/19, PVC's reported on 9/21 - ctm on telemetry - replete electrolytes  Hypokalemia -Replace, trend   Chronic diastolic heart failure -Appears euvolemic -Continue metoprolol  Abdominal pain, nausea, vomiting -CT abdomen pelvis unremarkable for acute pathology to explain abdominal pain -No further episodes -Advance diet to clears today  Moisture associated skin damage along bilateral buttocks -Wound RN consulted   Uncontrolled type 2 diabetes with peripheral autonomic neuropathy -Lantus, sliding scale insulin -HA1c 9.8 -Neurontin   History of CVA -Continue aspirin, zetia  Bullous pemphigus -Continue prednisone, methotrexate  HTN -Continue norvasc. Lopressor    DVT prophylaxis: lovenox Code Status:full  Family Communication: none at bedside Disposition Plan: to snf once improving  Consultants:   none  Procedures: (Don't include imaging studies which can be auto populated. Include things that cannot be auto populated i.e. Echo, Carotid and venous dopplers, Foley, Bipap, HD, tubes/drains, wound vac, central lines etc)  none  Antimicrobials: (specify start and planned stop date. Auto populated tables are space occupying and do not give end dates) Anti-infectives    Start     Dose/Rate Route Frequency Ordered Stop   05/31/17 0200  vancomycin (VANCOCIN) IVPB 750 mg/150 ml premix     750 mg 150 mL/hr over 60 Minutes Intravenous Every 12  hours 05/30/17 1331     05/28/17 1000  vancomycin (VANCOCIN) 1,250 mg in sodium chloride  0.9 % 250 mL IVPB  Status:  Discontinued     1,250 mg 166.7 mL/hr over 90 Minutes Intravenous Every 12 hours 05/27/17 2026 05/30/17 1331   05/28/17 1000  piperacillin-tazobactam (ZOSYN) IVPB 3.375 g     3.375 g 12.5 mL/hr over 240 Minutes Intravenous Every 8 hours 05/28/17 0936     05/27/17 2030  cefTRIAXone (ROCEPHIN) 2 g in dextrose 5 % 50 mL IVPB  Status:  Discontinued     2 g 100 mL/hr over 30 Minutes Intravenous Every 24 hours 05/27/17 2002 05/28/17 0936   05/27/17 2000  vancomycin (VANCOCIN) 2,500 mg in sodium chloride 0.9 % 500 mL IVPB     2,500 mg 250 mL/hr over 120 Minutes Intravenous  Once 05/27/17 1946 05/27/17 2241   05/27/17 1930  cefTRIAXone (ROCEPHIN) 1 g in dextrose 5 % 50 mL IVPB  Status:  Discontinued     1 g 100 mL/hr over 30 Minutes Intravenous  Once 05/27/17 1928 05/27/17 2017         Subjective: Hip pain continues to be better.   Objective: Vitals:   05/30/17 0509 05/30/17 1511 05/30/17 2050 05/31/17 0637  BP: 105/67 111/77 107/75 131/90  Pulse: 63 60 72 70  Resp:  16 17 18   Temp:  97.9 F (36.6 C) 98.1 F (36.7 C) 98 F (36.7 C)  TempSrc:  Oral Oral Oral  SpO2: 98% 99% 99% 97%  Weight:      Height:        Intake/Output Summary (Last 24 hours) at 05/31/17 1039 Last data filed at 05/31/17 1020  Gross per 24 hour  Intake             1260 ml  Output             1620 ml  Net             -360 ml   Filed Weights   05/27/17 2209 05/28/17 0604 05/29/17 0626  Weight: (!) 165.8 kg (365 lb 8.4 oz) (!) 165.3 kg (364 lb 6.7 oz) (!) 164.5 kg (362 lb 10.5 oz)    Examination:  General: No acute distress. Cardiovascular: Heart sounds show [name] regular rate, and rhythm. No gallops or rubs. No murmurs. No JVD. Lungs: Clear to auscultation bilaterally with good air movement. No rales, rhonchi or wheezes. Abdomen: Soft, nontender, nondistended with normal active bowel sounds. No masses. No hepatosplenomegaly. Neurological: Alert and oriented 3. Cranial nerves  II through XII grossly intact. Skin: erythema slightly improved.  Induration present.  Within borders.  Extensive from hip to buttock on R side.  Extremities: No clubbing or cyanosis. No edema. Pedal pulses 2+. Psychiatric: Mood and affect are normal. Insight and judgment are appropriate.  Data Reviewed: I have personally reviewed following labs and imaging studies  CBC:  Recent Labs Lab 05/27/17 1739 05/28/17 0503 05/29/17 0432 05/30/17 0425  WBC 17.5* 13.3* 8.9 7.9  NEUTROABS  --   --  6.5  --   HGB 11.8* 11.5* 10.2* 11.4*  HCT 35.8* 34.9* 30.6* 35.0*  MCV 90.2 88.4 90.0 88.6  PLT 271 252 242 235   Basic Metabolic Panel:  Recent Labs Lab 05/27/17 1739 05/28/17 0503 05/29/17 0432 05/29/17 1417 05/30/17 0425 05/31/17 0420  NA 135 135 139  --  134*  --   K 3.6 3.2* 3.7  --  3.5  --   CL  95* 98* 102  --  100*  --   CO2 31 28 30   --  27  --   GLUCOSE 222* 208* 176*  --  175*  --   BUN 16 14 11   --  7  --   CREATININE 0.95 0.87 0.84  --  0.74 0.85  CALCIUM 8.4* 8.1* 8.1*  --  8.2*  --   MG  --   --   --  1.8  --   --    GFR: Estimated Creatinine Clearance: 133.5 mL/min (by C-G formula based on SCr of 0.85 mg/dL). Liver Function Tests: No results for input(s): AST, ALT, ALKPHOS, BILITOT, PROT, ALBUMIN in the last 168 hours.  Recent Labs Lab 05/27/17 2028  LIPASE 15   No results for input(s): AMMONIA in the last 168 hours. Coagulation Profile:  Recent Labs Lab 05/27/17 2028  INR 1.08   Cardiac Enzymes: No results for input(s): CKTOTAL, CKMB, CKMBINDEX, TROPONINI in the last 168 hours. BNP (last 3 results) No results for input(s): PROBNP in the last 8760 hours. HbA1C: No results for input(s): HGBA1C in the last 72 hours. CBG:  Recent Labs Lab 05/30/17 0810 05/30/17 1319 05/30/17 1649 05/30/17 2049 05/31/17 0754  GLUCAP 130* 231* 168* 98 82   Lipid Profile: No results for input(s): CHOL, HDL, LDLCALC, TRIG, CHOLHDL, LDLDIRECT in the last 72  hours. Thyroid Function Tests: No results for input(s): TSH, T4TOTAL, FREET4, T3FREE, THYROIDAB in the last 72 hours. Anemia Panel: No results for input(s): VITAMINB12, FOLATE, FERRITIN, TIBC, IRON, RETICCTPCT in the last 72 hours. Sepsis Labs:  Recent Labs Lab 05/27/17 2028 05/27/17 2029 05/27/17 2242  PROCALCITON 0.13  --   --   LATICACIDVEN  --  1.1 1.3    Recent Results (from the past 240 hour(s))  Urine culture     Status: Abnormal   Collection Time: 05/27/17  7:20 PM  Result Value Ref Range Status   Specimen Description URINE, CLEAN CATCH  Final   Special Requests NONE  Final   Culture (Catalaya Garr)  Final    >=100,000 COLONIES/mL ESCHERICHIA COLI Confirmed Extended Spectrum Beta-Lactamase Producer (ESBL) Performed at [facility], [place] [address] [place], [place] [address]    Report Status 05/30/2017 FINAL  Final   Organism ID, Bacteria ESCHERICHIA COLI (Vinia Jemmott)  Final      Susceptibility   Escherichia coli - MIC*    AMPICILLIN >=32 RESISTANT Resistant     CEFAZOLIN >=64 RESISTANT Resistant     CEFTRIAXONE >=64 RESISTANT Resistant     CIPROFLOXACIN >=4 RESISTANT Resistant     GENTAMICIN <=1 SENSITIVE Sensitive     IMIPENEM <=0.25 SENSITIVE Sensitive     NITROFURANTOIN <=16 SENSITIVE Sensitive     TRIMETH/SULFA <=20 SENSITIVE Sensitive     AMPICILLIN/SULBACTAM >=32 RESISTANT Resistant     PIP/TAZO 8 SENSITIVE Sensitive     Extended ESBL POSITIVE Resistant     * >=100,000 COLONIES/mL ESCHERICHIA COLI  Blood culture (routine x 2)     Status: None (Preliminary result)   Collection Time: 05/27/17  8:17 PM  Result Value Ref Range Status   Specimen Description BLOOD RIGHT HAND  Final   Special Requests   Final    BOTTLES DRAWN AEROBIC AND ANAEROBIC Blood Culture adequate volume   Culture   Final    NO GROWTH 3 DAYS Performed at [facility], [address], [place], [place] [address]    Report Status PENDING  Incomplete  Blood culture (  routine x 2)     Status: None  (Preliminary result)   Collection Time: 05/27/17  8:28 PM  Result Value Ref Range Status   Specimen Description BLOOD LEFT ANTECUBITAL  Final   Special Requests IN PEDIATRIC BOTTLE Blood Culture adequate volume  Final   Culture   Final    NO GROWTH 3 DAYS Performed at [facility], [place] [address] [place], [place] [address]    Report Status PENDING  Incomplete  MRSA PCR Screening     Status: Abnormal   Collection Time: 05/27/17 11:35 PM  Result Value Ref Range Status   MRSA by PCR POSITIVE (Arra Connaughton) NEGATIVE Final    Comment:        The GeneXpert MRSA Assay (FDA approved for NASAL specimens only), is one component of Rosalee Tolley comprehensive MRSA colonization surveillance program. It is not intended to diagnose MRSA infection nor to guide or monitor treatment for MRSA infections. RESULT CALLED TO, READ BACK BY AND VERIFIED WITHArna Snipe RN 0422 05/28/17 Galia Rahm [facility]          Radiology Studies: No results found.      Scheduled Meds: . amLODipine  5 mg Oral Daily  . aspirin EC  81 mg Oral Daily  . chlorhexidine  15 mL Mouth Rinse BID  . Chlorhexidine Gluconate Cloth  6 each Topical Q0600  . clotrimazole   Topical BID  . enoxaparin (LOVENOX) injection  80 mg Subcutaneous QHS  . escitalopram  10 mg Oral Daily  . ezetimibe  10 mg Oral Daily  . folic acid  1 mg Oral Daily  . gabapentin  100 mg Oral BID  . guaiFENesin  600 mg Oral BID  . hydrOXYzine  25 mg Oral TID  . insulin aspart  0-9 Units Subcutaneous TID WC  . insulin glargine  28 Units Subcutaneous QHS  . mouth rinse  15 mL Mouth Rinse q12n4p  . metoprolol tartrate  12.5 mg Oral BID  . multivitamin with minerals  1 tablet Oral Q1500  . mupirocin ointment  1 application Nasal BID  . nystatin   Topical BID  . pantoprazole  40 mg Oral Daily  . predniSONE  10 mg Oral Daily  . rOPINIRole  0.5 mg Oral QHS  . senna-docusate  2 tablet Oral QHS  . traZODone  50 mg Oral QHS   Continuous Infusions: . piperacillin-tazobactam  (ZOSYN)  IV Stopped (05/31/17 0932)  . vancomycin Stopped (05/31/17 0401)     LOS: 4 days    Time spent: 25 min    Fayrene Helper, MD [facility]

## 2017-06-01 LAB — CULTURE, BLOOD (ROUTINE X 2)
CULTURE: NO GROWTH
Culture: NO GROWTH
Special Requests: ADEQUATE
Special Requests: ADEQUATE

## 2017-06-01 LAB — CBC
HCT: 32.5 % — ABNORMAL LOW (ref 39.0–52.0)
Hemoglobin: 10.6 g/dL — ABNORMAL LOW (ref 13.0–17.0)
MCH: 29.7 pg (ref 26.0–34.0)
MCHC: 32.6 g/dL (ref 30.0–36.0)
MCV: 91 fL (ref 78.0–100.0)
PLATELETS: 301 10*3/uL (ref 150–400)
RBC: 3.57 MIL/uL — AB (ref 4.22–5.81)
RDW: 13.4 % (ref 11.5–15.5)
WBC: 7.2 10*3/uL (ref 4.0–10.5)

## 2017-06-01 LAB — BASIC METABOLIC PANEL
ANION GAP: 8 (ref 5–15)
CO2: 27 mmol/L (ref 22–32)
Calcium: 8.2 mg/dL — ABNORMAL LOW (ref 8.9–10.3)
Chloride: 102 mmol/L (ref 101–111)
Creatinine, Ser: 0.95 mg/dL (ref 0.61–1.24)
GFR calc Af Amer: >60 mL/min (ref >60)
Glucose, Bld: 233 mg/dL — ABNORMAL HIGH (ref 65–99)
POTASSIUM: 3.8 mmol/L (ref 3.5–5.1)
SODIUM: 137 mmol/L (ref 135–145)

## 2017-06-01 LAB — GLUCOSE, CAPILLARY
GLUCOSE-CAPILLARY: 169 mg/dL — AB (ref 65–99)
Glucose-Capillary: 210 mg/dL — ABNORMAL HIGH (ref 65–99)
Glucose-Capillary: 342 mg/dL — ABNORMAL HIGH (ref 65–99)

## 2017-06-01 NOTE — Progress Notes (Signed)
PROGRESS NOTE    [name]  LZJ:[id number] DOB: 1946-08-09 DOA: 05/27/2017 PCP: Gildardo Cranker, DO    Brief Narrative: [name] 71 y.o.malewith medical history significant of indwelling catheter, hypertension, hyperlipidemia, diabetes mellitus, stroke, GERD, depression, OSA not on CPAP, obesity, DVT 1980, dCHF, CAD, bullous pemphigus, who presents with fever, left hip redness and pain, urinary retention, dysuria. He was brought from his skilled nursing facility for request of Foley replacement. He has history of urinary retention and has Venice Liz chronic indwelling catheter. Over the past 45 years. Facility was unable to replace it, which brought him to the emergency department on 9/17. He also has left hip redness and pain which had been ongoing for several days, reported fevers and chills. Patient was admitted for left-sided leg cellulitis as well as catheter related UTI.   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  He notes he feels its [name] little better (see image below).  -Chronic Foley in place (exchanged as noted below) -Empiric Vanco and Zosyn  - Plan to discontinue zosyn today after 5 days of therapy (he also denies clear urinary sx on admission).  -Blood cultures NGTD x 2 days -Urine culture pending -> e. 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 No focal deficits, signs of stroke or seizure -?Etiology, perhaps vasovagal response. Patient nonambulatory at baseline  [ ]  repeat echo (EF 02-40%, grade 1 diastolic dysfunction) -Treat infection as above  Nonsustained Vtach  - 15 beats seen on tele on 9/19 - ctm on telemetry (PVC's) - replete electrolytes  Hypokalemia -Replace, trend   Chronic diastolic heart failure -Appears euvolemic -Continue metoprolol  Abdominal pain, nausea, vomiting -CT abdomen pelvis unremarkable for acute pathology to explain abdominal pain -No further episodes -Advance diet to clears today  Moisture associated skin damage along bilateral buttocks -Wound RN consulted   Uncontrolled type 2 diabetes with peripheral autonomic neuropathy -Lantus, sliding scale insulin -HA1c 9.8 -Neurontin   History of CVA -Continue aspirin, zetia  Bullous pemphigus -Continue prednisone, methotrexate  HTN -Continue norvasc. Lopressor    DVT prophylaxis: lovenox Code Status:full  Family Communication: none at bedside Disposition Plan: to snf once improving  Consultants:   none  Procedures: (Don't include imaging studies which can be auto populated. Include things that cannot be auto populated i.e. Echo, Carotid and venous dopplers, Foley, Bipap, HD, tubes/drains, wound vac, central lines etc)  none  Antimicrobials: (specify start and planned stop date. Auto populated tables are space occupying and do not give end dates) Anti-infectives    Start     Dose/Rate Route Frequency Ordered Stop  05/31/17 0200  vancomycin (VANCOCIN) IVPB 750 mg/150 ml premix     750 mg 150 mL/hr over 60 Minutes  Intravenous Every 12 hours 05/30/17 1331     05/28/17 1000  vancomycin (VANCOCIN) 1,250 mg in sodium chloride 0.9 % 250 mL IVPB  Status:  Discontinued     1,250 mg 166.7 mL/hr over 90 Minutes Intravenous Every 12 hours 05/27/17 2026 05/30/17 1331   05/28/17 1000  piperacillin-tazobactam (ZOSYN) IVPB 3.375 g     3.375 g 12.5 mL/hr over 240 Minutes Intravenous Every 8 hours 05/28/17 0936     05/27/17 2030  cefTRIAXone (ROCEPHIN) 2 g in dextrose 5 % 50 mL IVPB  Status:  Discontinued     2 g 100 mL/hr over 30 Minutes Intravenous Every 24 hours 05/27/17 2002 05/28/17 0936   05/27/17 2000  vancomycin (VANCOCIN) 2,500 mg in sodium chloride 0.9 % 500 mL IVPB     2,500 mg 250 mL/hr over 120 Minutes Intravenous  Once 05/27/17 1946 05/27/17 2241   05/27/17 1930  cefTRIAXone (ROCEPHIN) 1 g in dextrose 5 % 50 mL IVPB  Status:  Discontinued     1 g 100 mL/hr over 30 Minutes Intravenous  Once 05/27/17 1928 05/27/17 2017         Subjective: Doing ok.  Hip steadily feels better.   Objective: Vitals:   05/31/17 0637 05/31/17 1418 05/31/17 2213 06/01/17 0523  BP: 131/90 93/77 133/77 131/84  Pulse: 70 78 81 67  Resp: 18 18 18 17   Temp: 98 F (36.7 C) 98.3 F (36.8 C) 98.1 F (36.7 C) 97.6 F (36.4 C)  TempSrc: Oral Oral Oral Oral  SpO2: 97% 98% 97% 98%  Weight:    (!) 167.3 kg (368 lb 13.3 oz)  Height:        Intake/Output Summary (Last 24 hours) at 06/01/17 1056 Last data filed at 06/01/17 0959  Gross per 24 hour  Intake              880 ml  Output             2700 ml  Net            -1820 ml   Filed Weights   05/28/17 0604 05/29/17 0626 06/01/17 0523  Weight: (!) 165.3 kg (364 lb 6.7 oz) (!) 164.5 kg (362 lb 10.5 oz) (!) 167.3 kg (368 lb 13.3 oz)    Examination:  General: No acute distress. Cardiovascular: Heart sounds show Sarrah Fiorenza regular rate, and rhythm. No gallops or rubs. No murmurs. No JVD. Lungs: Clear to auscultation bilaterally with good air movement. No rales, rhonchi or  wheezes. Abdomen: Soft, nontender, nondistended with normal active bowel sounds. No masses. No hepatosplenomegaly. Neurological: Alert and oriented 3. Moves all extremities 4 with equal strength. Cranial nerves II through XII grossly intact. Skin: Erythema to L hip, appears mostly within borders, some induration as well.  Slowly improving.   Extremities: No clubbing or cyanosis. Trace LEE. Pedal pulses 2+. Psychiatric: Mood and affect are normal. Insight and judgment are appropriate.     Data Reviewed: I have personally reviewed following labs and imaging studies  CBC:  Recent Labs Lab 05/27/17 1739 05/28/17 0503 05/29/17 0432 05/30/17 0425 06/01/17 0409  WBC 17.5* 13.3* 8.9 7.9 7.2  NEUTROABS  --   --  6.5  --   --   HGB 11.8* 11.5* 10.2* 11.4* 10.6*  HCT 35.8* 34.9* 30.6* 35.0* 32.5*  MCV 90.2 88.4 90.0 88.6 91.0  PLT  271 252 242 279 270   Basic Metabolic Panel:  Recent Labs Lab 05/27/17 1739 05/28/17 0503 05/29/17 0432 05/29/17 1417 05/30/17 0425 05/31/17 0420 06/01/17 0409  NA 135 135 139  --  134*  --  137  K 3.6 3.2* 3.7  --  3.5  --  3.8  CL 95* 98* 102  --  100*  --  102  CO2 31 28 30   --  27  --  27  GLUCOSE 222* 208* 176*  --  175*  --  233*  BUN 16 14 11   --  7  --  <5*  CREATININE 0.95 0.87 0.84  --  0.74 0.85 0.95  CALCIUM 8.4* 8.1* 8.1*  --  8.2*  --  8.2*  MG  --   --   --  1.8  --   --   --    GFR: Estimated Creatinine Clearance: 120.6 mL/min (by C-G formula based on SCr of 0.95 mg/dL). Liver Function Tests: No results for input(s): AST, ALT, ALKPHOS, BILITOT, PROT, ALBUMIN in the last 168 hours.  Recent Labs Lab 05/27/17 2028  LIPASE 15   No results for input(s): AMMONIA in the last 168 hours. Coagulation Profile:  Recent Labs Lab 05/27/17 2028  INR 1.08   Cardiac Enzymes: No results for input(s): CKTOTAL, CKMB, CKMBINDEX, TROPONINI in the last 168 hours. BNP (last 3 results) No results for input(s): PROBNP in the last 8760  hours. HbA1C: No results for input(s): HGBA1C in the last 72 hours. CBG:  Recent Labs Lab 05/30/17 2049 05/31/17 0754 05/31/17 1156 05/31/17 1754 05/31/17 2217  GLUCAP 98 82 139* 309* 236*   Lipid Profile: No results for input(s): CHOL, HDL, LDLCALC, TRIG, CHOLHDL, LDLDIRECT in the last 72 hours. Thyroid Function Tests: No results for input(s): TSH, T4TOTAL, FREET4, T3FREE, THYROIDAB in the last 72 hours. Anemia Panel: No results for input(s): VITAMINB12, FOLATE, FERRITIN, TIBC, IRON, RETICCTPCT in the last 72 hours. Sepsis Labs:  Recent Labs Lab 05/27/17 2028 05/27/17 2029 05/27/17 2242  PROCALCITON 0.13  --   --   LATICACIDVEN  --  1.1 1.3    Recent Results (from the past 240 hour(s))  Urine culture     Status: Abnormal   Collection Time: 05/27/17  7:20 PM  Result Value Ref Range Status   Specimen Description URINE, CLEAN CATCH  Final   Special Requests NONE  Final   Culture (Delancey Moraes)  Final    >=100,000 COLONIES/mL ESCHERICHIA COLI Confirmed Extended Spectrum Beta-Lactamase Producer (ESBL) Performed at [facility], [place] [address], [place], [place] [address]    Report Status 05/30/2017 FINAL  Final   Organism ID, Bacteria ESCHERICHIA COLI (Lashe Oliveira)  Final      Susceptibility   Escherichia coli - MIC*    AMPICILLIN >=32 RESISTANT Resistant     CEFAZOLIN >=64 RESISTANT Resistant     CEFTRIAXONE >=64 RESISTANT Resistant     CIPROFLOXACIN >=4 RESISTANT Resistant     GENTAMICIN <=1 SENSITIVE Sensitive     IMIPENEM <=0.25 SENSITIVE Sensitive     NITROFURANTOIN <=16 SENSITIVE Sensitive     TRIMETH/SULFA <=20 SENSITIVE Sensitive     AMPICILLIN/SULBACTAM >=32 RESISTANT Resistant     PIP/TAZO 8 SENSITIVE Sensitive     Extended ESBL POSITIVE Resistant     * >=100,000 COLONIES/mL ESCHERICHIA COLI  Blood culture (routine x 2)     Status: None (Preliminary result)   Collection Time: 05/27/17  8:17 PM  Result Value Ref Range Status  Specimen Description BLOOD RIGHT HAND   Final   Special Requests   Final    BOTTLES DRAWN AEROBIC AND ANAEROBIC Blood Culture adequate volume   Culture   Final    NO GROWTH 4 DAYS Performed at [facility], [address], [place], [place] [address]    Report Status PENDING  Incomplete  Blood culture (routine x 2)     Status: None (Preliminary result)   Collection Time: 05/27/17  8:28 PM  Result Value Ref Range Status   Specimen Description BLOOD LEFT ANTECUBITAL  Final   Special Requests IN PEDIATRIC BOTTLE Blood Culture adequate volume  Final   Culture   Final    NO GROWTH 4 DAYS Performed at [facility], [place] [address], [place], [place] [address]    Report Status PENDING  Incomplete  MRSA PCR Screening     Status: Abnormal   Collection Time: 05/27/17 11:35 PM  Result Value Ref Range Status   MRSA by PCR POSITIVE (Yvonna Brun) NEGATIVE Final    Comment:        The GeneXpert MRSA Assay (FDA approved for NASAL specimens only), is one component of Amel Kitch comprehensive MRSA colonization surveillance program. It is not intended to diagnose MRSA infection nor to guide or monitor treatment for MRSA infections. RESULT CALLED TO, READ BACK BY AND VERIFIED WITHArna Snipe RN 0422 05/28/17 Russel Morain [facility]          Radiology Studies: No results found.      Scheduled Meds: . amLODipine  5 mg Oral Daily  . aspirin EC  81 mg Oral Daily  . chlorhexidine  15 mL Mouth Rinse BID  . clotrimazole   Topical BID  . enoxaparin (LOVENOX) injection  80 mg Subcutaneous QHS  . escitalopram  10 mg Oral Daily  . ezetimibe  10 mg Oral Daily  . folic acid  1 mg Oral Daily  . gabapentin  100 mg Oral BID  . guaiFENesin  600 mg Oral BID  . hydrOXYzine  25 mg Oral TID  . insulin aspart  0-9 Units Subcutaneous TID WC  . insulin glargine  28 Units Subcutaneous QHS  . mouth rinse  15 mL Mouth Rinse q12n4p  . metoprolol tartrate  12.5 mg Oral BID  . multivitamin with minerals  1 tablet Oral Q1500  . mupirocin ointment  1 application Nasal  BID  . nystatin   Topical BID  . pantoprazole  40 mg Oral Daily  . predniSONE  10 mg Oral Daily  . rOPINIRole  0.5 mg Oral QHS  . senna-docusate  2 tablet Oral QHS  . traZODone  50 mg Oral QHS   Continuous Infusions: . piperacillin-tazobactam (ZOSYN)  IV 3.375 g (06/01/17 0954)  . vancomycin Stopped (06/01/17 0342)     LOS: 5 days    Time spent: 25 min    Fayrene Helper, MD [facility]

## 2017-06-01 NOTE — Progress Notes (Signed)
Pt refused CBG this AM

## 2017-06-02 DIAGNOSIS — D649 Anemia, unspecified: Secondary | ICD-10-CM

## 2017-06-02 LAB — GLUCOSE, CAPILLARY
GLUCOSE-CAPILLARY: 201 mg/dL — AB (ref 65–99)
Glucose-Capillary: 182 mg/dL — ABNORMAL HIGH (ref 65–99)
Glucose-Capillary: 196 mg/dL — ABNORMAL HIGH (ref 65–99)
Glucose-Capillary: 276 mg/dL — ABNORMAL HIGH (ref 65–99)

## 2017-06-02 LAB — IRON AND TIBC
IRON: 45 ug/dL (ref 45–182)
SATURATION RATIOS: 20 % (ref 17.9–39.5)
TIBC: 227 ug/dL — AB (ref 250–450)
UIBC: 182 ug/dL

## 2017-06-02 LAB — CREATININE, SERUM
Creatinine, Ser: 0.9 mg/dL (ref 0.61–1.24)
GFR calc non Af Amer: >60 mL/min (ref >60)

## 2017-06-02 LAB — VITAMIN B12: Vitamin B-12: 357 pg/mL (ref 180–914)

## 2017-06-02 LAB — FOLATE: Folate: 19 ng/mL (ref >5.9)

## 2017-06-02 LAB — FERRITIN: FERRITIN: 123 ng/mL (ref 24–336)

## 2017-06-02 LAB — VANCOMYCIN, TROUGH: Vancomycin Tr: 17 ug/mL (ref 15–20)

## 2017-06-02 NOTE — Progress Notes (Signed)
PROGRESS NOTE    [name]  MHD:[id number] DOB: 10/18/1945 DOA: 05/27/2017 PCP: Brian Cranker, DO    Brief Narrative: [name] [name] 71 y.o.malewith medical history significant of indwelling catheter, hypertension, hyperlipidemia, diabetes mellitus, stroke, GERD, depression, OSA not on CPAP, obesity, DVT 1980, dCHF, CAD, bullous pemphigus, who presents with fever, left hip redness and pain, urinary retention, dysuria. He was brought from his skilled nursing facility for request of Foley replacement. He has history of urinary retention and has [name] chronic indwelling catheter. Over the past 45 years. Facility was unable to replace it, which brought him to the emergency department on 9/17. He also has left hip redness and pain which had been ongoing for several days, reported fevers and chills. Patient was admitted for left-sided leg cellulitis as well as catheter related UTI.   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   Overall feels better, but induration seems [name] little worse today, on comparison of images, redness seems [name] little better.  -Chronic Foley in place (exchanged as noted below) - vancomycin (9/17-  ) - zosyn (9/18-9/23) ceftraixone x 1 dose.  -Blood cultures NGTD x 2 days -Urine culture -> e. 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 No focal deficits, signs of stroke or seizure -?Etiology, perhaps vasovagal response. Patient nonambulatory at baseline  [ ]  repeat echo (EF 21-19%, grade 1 diastolic dysfunction) -Treat infection as above  Nonsustained Vtach  - 15 beats seen on tele on 9/19 - ctm on telemetry (PVC's) - replete electrolytes  Hypokalemia -Replace, trend   Anemia:  - panel pending  Chronic diastolic heart failure -Appears euvolemic -Continue metoprolol  Abdominal pain, nausea, vomiting -CT abdomen pelvis unremarkable for acute pathology to explain abdominal pain -No further episodes -Advance diet to clears today  Moisture associated skin damage along bilateral buttocks -Wound RN consulted   Uncontrolled type 2 diabetes with peripheral autonomic neuropathy -Lantus, sliding scale insulin -HA1c 9.8 -Neurontin   History of CVA -Continue aspirin, zetia  Bullous pemphigus -Continue prednisone, methotrexate  HTN -Continue norvasc. Lopressor    DVT prophylaxis: lovenox Code Status:full  Family Communication: none at bedside, will call brother today Disposition Plan: to snf once improving  Consultants:   none  Procedures: (Don't include imaging studies which can be auto populated. Include things that cannot be auto populated i.e. Echo, Carotid and venous dopplers, Foley, Bipap, HD, tubes/drains, wound vac, central lines etc)  none  Antimicrobials: (specify start and planned stop date. Auto populated tables are  space occupying and do not give end dates) Anti-infectives    Start     Dose/Rate Route  Frequency Ordered Stop   05/31/17 0200  vancomycin (VANCOCIN) IVPB 750 mg/150 ml premix     750 mg 150 mL/hr over 60 Minutes Intravenous Every 12 hours 05/30/17 1331     05/28/17 1000  vancomycin (VANCOCIN) 1,250 mg in sodium chloride 0.9 % 250 mL IVPB  Status:  Discontinued     1,250 mg 166.7 mL/hr over 90 Minutes Intravenous Every 12 hours 05/27/17 2026 05/30/17 1331   05/28/17 1000  piperacillin-tazobactam (ZOSYN) IVPB 3.375 g  Status:  Discontinued     3.375 g 12.5 mL/hr over 240 Minutes Intravenous Every 8 hours 05/28/17 0936 06/01/17 1902   05/27/17 2030  cefTRIAXone (ROCEPHIN) 2 g in dextrose 5 % 50 mL IVPB  Status:  Discontinued     2 g 100 mL/hr over 30 Minutes Intravenous Every 24 hours 05/27/17 2002 05/28/17 0936   05/27/17 2000  vancomycin (VANCOCIN) 2,500 mg in sodium chloride 0.9 % 500 mL IVPB     2,500 mg 250 mL/hr over 120 Minutes Intravenous  Once 05/27/17 1946 05/27/17 2241   05/27/17 1930  cefTRIAXone (ROCEPHIN) 1 g in dextrose 5 % 50 mL IVPB  Status:  Discontinued     1 g 100 mL/hr over 30 Minutes Intravenous  Once 05/27/17 1928 05/27/17 2017         Subjective: Feels like things are getting better.  Induration maybe [name] little worse today?  Objective: Vitals:   06/01/17 0523 06/01/17 1218 06/01/17 2138 06/02/17 0625  BP: 131/84 100/68 105/65 120/73  Pulse: 67 77 82 66  Resp: 17 18 18 18   Temp: 97.6 F (36.4 C) 97.9 F (36.6 C) 98.7 F (37.1 C) 98.1 F (36.7 C)  TempSrc: Oral Oral Oral Oral  SpO2: 98% 100% 95% 97%  Weight: (!) 167.3 kg (368 lb 13.3 oz)   (!) 169.3 kg (373 lb 3.8 oz)  Height:        Intake/Output Summary (Last 24 hours) at 06/02/17 1007 Last data filed at 06/02/17 8101  Gross per 24 hour  Intake              680 ml  Output             3250 ml  Net            -2570 ml   Filed Weights   05/29/17 0626 06/01/17 0523 06/02/17 0625  Weight: (!) 164.5 kg (362 lb 10.5 oz) (!) 167.3 kg (368 lb 13.3 oz) (!) 169.3 kg (373 lb 3.8 oz)     Examination:  General: No acute distress. Cardiovascular: Heart sounds show [name] regular rate, and rhythm. No gallops or rubs. No murmurs. No JVD. Lungs: Clear to auscultation bilaterally with good air movement. No rales, rhonchi or wheezes. Abdomen: Soft, nontender, nondistended with normal active bowel sounds. No masses. No hepatosplenomegaly. Neurological: Alert and oriented 3. Moves all extremities 4 with equal strength. Cranial nerves II through XII grossly intact. Skin: induration maybe Raley Novicki bit worse than yesterday, erythema still within borders, slightly improved.  Extremities: No clubbing or cyanosis. 1+ LEE. Psychiatric: Mood and affect are normal. Insight and judgment are appropriate. 9/23   9/22    Data Reviewed: I have personally reviewed following labs and imaging studies  CBC:  Recent Labs Lab 05/27/17 1739 05/28/17 0503 05/29/17 0432 05/30/17 0425 06/01/17 0409  WBC 17.5* 13.3* 8.9 7.9 7.2  NEUTROABS  --   --  6.5  --   --   HGB 11.8* 11.5* 10.2* 11.4* 10.6*  HCT 35.8* 34.9* 30.6* 35.0* 32.5*  MCV 90.2 88.4 90.0 88.6 91.0  PLT 271 252 242 279 160   Basic Metabolic Panel:  Recent Labs Lab 05/27/17 1739 05/28/17 0503 05/29/17 0432 05/29/17 1417 05/30/17 0425 05/31/17 0420 06/01/17 0409 06/02/17 0434  NA 135 135 139  --  134*  --  137  --   K 3.6 3.2* 3.7  --  3.5  --  3.8  --   CL 95* 98* 102  --  100*  --  102  --   CO2 31 28 30   --  27  --  27  --   GLUCOSE 222* 208* 176*  --  175*  --  233*  --   BUN 16 14 11   --  7  --  <5*  --   CREATININE 0.95 0.87 0.84  --  0.74 0.85 0.95 0.90  CALCIUM 8.4* 8.1* 8.1*  --  8.2*  --  8.2*  --   MG  --   --   --  1.8  --   --   --   --    GFR: Estimated Creatinine Clearance: 128.2 mL/min (by C-G formula based on SCr of 0.9 mg/dL). Liver Function Tests: No results for input(s): AST, ALT, ALKPHOS, BILITOT, PROT, ALBUMIN in the last 168 hours.  Recent Labs Lab 05/27/17 2028  LIPASE 15   No results  for input(s): AMMONIA in the last 168 hours. Coagulation Profile:  Recent Labs Lab 05/27/17 2028  INR 1.08   Cardiac Enzymes: No results for input(s): CKTOTAL, CKMB, CKMBINDEX, TROPONINI in the last 168 hours. BNP (last 3 results) No results for input(s): PROBNP in the last 8760 hours. HbA1C: No results for input(s): HGBA1C in the last 72 hours. CBG:  Recent Labs Lab 05/31/17 2217 06/01/17 1217 06/01/17 1614 06/01/17 2243 06/02/17 0750  GLUCAP 236* 169* 210* 342* 182*   Lipid Profile: No results for input(s): CHOL, HDL, LDLCALC, TRIG, CHOLHDL, LDLDIRECT in the last 72 hours. Thyroid Function Tests: No results for input(s): TSH, T4TOTAL, FREET4, T3FREE, THYROIDAB in the last 72 hours. Anemia Panel: No results for input(s): VITAMINB12, FOLATE, FERRITIN, TIBC, IRON, RETICCTPCT in the last 72 hours. Sepsis Labs:  Recent Labs Lab 05/27/17 2028 05/27/17 2029 05/27/17 2242  PROCALCITON 0.13  --   --   LATICACIDVEN  --  1.1 1.3    Recent Results (from the past 240 hour(s))  Urine culture     Status: Abnormal   Collection Time: 05/27/17  7:20 PM  Result Value Ref Range Status   Specimen Description URINE, CLEAN CATCH  Final   Special Requests NONE  Final   Culture ([place])  Final    >=100,000 COLONIES/mL ESCHERICHIA COLI Confirmed Extended Spectrum Beta-Lactamase Producer (ESBL) Performed at [facility], [place] [address] [place], [place] [address]    Report Status 05/30/2017 FINAL  Final   Organism ID, Bacteria ESCHERICHIA COLI (Darvis Croft)  Final      Susceptibility   Escherichia coli - MIC*    AMPICILLIN >=32 RESISTANT Resistant     CEFAZOLIN >=64 RESISTANT Resistant     CEFTRIAXONE >=64 RESISTANT Resistant     CIPROFLOXACIN >=4 RESISTANT Resistant     GENTAMICIN <=1 SENSITIVE Sensitive     IMIPENEM <=0.25 SENSITIVE Sensitive     NITROFURANTOIN <=16 SENSITIVE Sensitive     TRIMETH/SULFA <=20 SENSITIVE Sensitive  AMPICILLIN/SULBACTAM >=32 RESISTANT Resistant      PIP/TAZO 8 SENSITIVE Sensitive     Extended ESBL POSITIVE Resistant     * >=100,000 COLONIES/mL ESCHERICHIA COLI  Blood culture (routine x 2)     Status: None   Collection Time: 05/27/17  8:17 PM  Result Value Ref Range Status   Specimen Description BLOOD RIGHT HAND  Final   Special Requests   Final    BOTTLES DRAWN AEROBIC AND ANAEROBIC Blood Culture adequate volume   Culture   Final    NO GROWTH 5 DAYS Performed at [facility], [place] [address], [place], [place] [address]    Report Status 06/01/2017 FINAL  Final  Blood culture (routine x 2)     Status: None   Collection Time: 05/27/17  8:28 PM  Result Value Ref Range Status   Specimen Description BLOOD LEFT ANTECUBITAL  Final   Special Requests IN PEDIATRIC BOTTLE Blood Culture adequate volume  Final   Culture   Final    NO GROWTH 5 DAYS Performed at [facility], [place] [address] [place], [place] [address]    Report Status 06/01/2017 FINAL  Final  MRSA PCR Screening     Status: Abnormal   Collection Time: 05/27/17 11:35 PM  Result Value Ref Range Status   MRSA by PCR POSITIVE (Coleson Kant) NEGATIVE Final    Comment:        The GeneXpert MRSA Assay (FDA approved for NASAL specimens only), is one component of Eufelia Veno comprehensive MRSA colonization surveillance program. It is not intended to diagnose MRSA infection nor to guide or monitor treatment for MRSA infections. RESULT CALLED TO, READ BACK BY AND VERIFIED WITHArna Snipe RN 0422 05/28/17 Bassel Gaskill [facility]          Radiology Studies: No results found.      Scheduled Meds: . amLODipine  5 mg Oral Daily  . aspirin EC  81 mg Oral Daily  . chlorhexidine  15 mL Mouth Rinse BID  . clotrimazole   Topical BID  . enoxaparin (LOVENOX) injection  80 mg Subcutaneous QHS  . escitalopram  10 mg Oral Daily  . ezetimibe  10 mg Oral Daily  . folic acid  1 mg Oral Daily  . gabapentin  100 mg Oral BID  . guaiFENesin  600 mg Oral BID  . hydrOXYzine  25 mg Oral TID  . insulin  aspart  0-9 Units Subcutaneous TID WC  . insulin glargine  28 Units Subcutaneous QHS  . mouth rinse  15 mL Mouth Rinse q12n4p  . metoprolol tartrate  12.5 mg Oral BID  . multivitamin with minerals  1 tablet Oral Q1500  . nystatin   Topical BID  . pantoprazole  40 mg Oral Daily  . predniSONE  10 mg Oral Daily  . rOPINIRole  0.5 mg Oral QHS  . senna-docusate  2 tablet Oral QHS  . traZODone  50 mg Oral QHS   Continuous Infusions: . vancomycin Stopped (06/02/17 0218)     LOS: 6 days    Time spent: 25 min    Fayrene Helper, MD [facility]

## 2017-06-02 NOTE — Social Work (Signed)
[name] will continue to follow.  Pt will return to [facility] once medically ready.  Elissa Hefty, LCSW Clinical Social Worker [phone]

## 2017-06-02 NOTE — Progress Notes (Signed)
Pharmacy Antibiotic Note  [name] is a 71 y.o. obese male admitted on 05/27/2017 with left hip cellulitis. No abscess noted on hip CT. Patient's currently on vancomycin for infection; Zosyn was d/c yesterday.  Today, 06/02/2017: - day #6 abx - afeb, wbc wnl, scr 0.9 (crcl~78N) - vancomycin trough level acceptable  Plan: - Continue Vancomycin 750mg  IV q12h. - Follow up renal fxn, culture results, and clinical course.   ________________________________________  Height: 6\' 2"  (188 cm) Weight: (!) 373 lb 3.8 oz (169.3 kg) IBW/kg (Calculated) : 82.2  Temp (24hrs), Avg:98.4 F (36.9 C), Min:98.1 F (36.7 C), Max:98.7 F (37.1 C)   Recent Labs Lab 05/27/17 1739 05/27/17 2029 05/27/17 2242 05/28/17 0503 05/29/17 0432 05/30/17 0425 05/30/17 0942 05/31/17 0420 06/01/17 0409 06/02/17 0434  WBC 17.5*  --   --  13.3* 8.9 7.9  --   --  7.2  --   CREATININE 0.95  --   --  0.87 0.84 0.74  --  0.85 0.95 0.90  LATICACIDVEN  --  1.1 1.3  --   --   --   --   --   --   --   VANCOTROUGH  --   --   --   --   --   --  22*  --   --   --     Estimated Creatinine Clearance: 128.2 mL/min (by C-G formula based on SCr of 0.9 mg/dL).    Allergies  Allergen Reactions  . Ace Inhibitors Swelling    Pt tolerates lisinopril  . Lipitor [Atorvastatin Calcium] Swelling  . Metformin And Related Swelling  . Cefadroxil Hives  . Cephalexin Hives  . Morphine And Related Other (See Comments)    Sweating, feels like is "in rocky boat."  . Robaxin [Methocarbamol] Other (See Comments)    Feels like he is shaky    Antimicrobials this admission: 9/17 ceftriaxone >> 9/17 9/17 vancomycin >> 9/17 zosyn>>9/22  Dose adjustments this admission: 9/20 VT at 0930= 22 (1250 mg q12h)--> change to 750 mg qq12h (est tr 14) 9/23 1300 VT = 17 on 750 q12, slightly above goal of 10-15, but no change  Microbiology results: 9/17 MRSA PCR (+) on chlx and bactroban 9/17 bcx x2: NGF 9/17 ucx:>100K ecoli ESBL  (S= gent, imi, nitro, bactrim, zosyn) - may be incidental finding as pt has chronic foley.   Thank you for allowing pharmacy to be a part of this patient's care.  Gretta Arab PharmD, BCPS Pager [phone] 06/02/2017 12:25 PM

## 2017-06-03 LAB — CREATININE, SERUM
Creatinine, Ser: 0.82 mg/dL (ref 0.61–1.24)
GFR calc non Af Amer: >60 mL/min (ref >60)

## 2017-06-03 LAB — GLUCOSE, CAPILLARY
Glucose-Capillary: 183 mg/dL — ABNORMAL HIGH (ref 65–99)
Glucose-Capillary: 204 mg/dL — ABNORMAL HIGH (ref 65–99)
Glucose-Capillary: 245 mg/dL — ABNORMAL HIGH (ref 65–99)

## 2017-06-03 MED ORDER — CEFAZOLIN SODIUM-DEXTROSE 2-4 GM/100ML-% IV SOLN
2.0000 g | Freq: Three times a day (TID) | INTRAVENOUS | Status: DC
  Administered 2017-06-03 – 2017-06-05 (×7): 2 g via INTRAVENOUS
  Filled 2017-06-03 (×9): qty 100

## 2017-06-03 NOTE — Progress Notes (Signed)
PROGRESS NOTE    [name]  JHE:[id number] DOB: 1946-07-04 DOA: 05/27/2017 PCP: Gildardo Cranker, DO    Brief Narrative: [name] 71 y.o.malewith medical history significant of indwelling catheter, hypertension, hyperlipidemia, diabetes mellitus, stroke, GERD, depression, OSA not on CPAP, obesity, DVT 1980, dCHF, CAD, bullous pemphigus, who presents with fever, left hip redness and pain, urinary retention, dysuria. He was brought from his skilled nursing facility for request of Foley replacement. He has history of urinary retention and has [name] chronic indwelling catheter. Over the past 45 years. Facility was unable to replace it, which brought him to the emergency department on 9/17. He also has left hip redness and pain which had been ongoing for several days, reported fevers and chills. Patient was admitted for left-sided leg cellulitis as well as catheter related UTI.   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  Not significantly different than yesterday.   Discussed case with ID and noted given obesity, slow improvement of redness/induration can be expected.  Noted trial ancef.   Has cephalosporin allergy listed, but denies this.  Pharmacy saw he received ceftriaxone in 2015. Will try ancef and see if this helps.  -Chronic Foley in place (exchanged as noted below) -  vancomycin (9/17- 9/24) - ancef (9/24 - )  - zosyn (9/18-9/23) ceftraixone x 1 dose.  -Blood cultures NGTD x 2 days -Urine culture -> e. Coli >100,000  (sensitive to zosyn), d/c'd zosyn after 5 days therapy (pt without clear sx of UTI with chronic foley)  -Check CT left hip to rule out underlying abscess (notable for cellulitis, but no obvious abscess, myofasciitis or pyomyositis)  Urinary retention and chronic indwelling Foley catheter -Catheter exchanged in the emergency department 9/17 - abx as above  Syncope -Episode of syncope when he was transferred from bed to stretcher by EMS. No focal deficits, signs of stroke or seizure -?Etiology, perhaps vasovagal response. Patient nonambulatory at baseline  [ ]  repeat echo (EF 18-56%, grade 1 diastolic dysfunction) -Treat infection as above  Nonsustained Vtach  - 15 beats seen on tele on 9/19 - ctm on telemetry (PVC's) - replete electrolytes  Hypokalemia -Replace, trend   Anemia:  - panel pending  Chronic diastolic heart failure -Appears euvolemic -Continue metoprolol  Abdominal pain, nausea, vomiting -CT abdomen pelvis unremarkable for acute pathology to explain abdominal pain -No further episodes -Advance diet to clears today  Moisture associated skin damage along bilateral buttocks -Wound RN consulted   Uncontrolled type 2 diabetes with peripheral autonomic neuropathy -Lantus, sliding scale insulin -HA1c 9.8 -Neurontin   History of CVA -Continue aspirin, zetia  Bullous pemphigus -Continue prednisone, methotrexate  HTN -Continue norvasc. Lopressor    DVT prophylaxis: lovenox Code Status:full  Family Communication: none at bedside Disposition Plan: to snf once improving  Consultants:   none  Procedures: (Don't include imaging studies which can be auto populated. Include things that cannot  be auto populated i.e. Echo, Carotid and venous dopplers, Foley, Bipap, HD, tubes/drains, wound vac, central  lines etc)  none  Antimicrobials: (specify start and planned stop date. Auto populated tables are space occupying and do not give end dates) Anti-infectives    Start     Dose/Rate Route Frequency Ordered Stop   06/03/17 1400  ceFAZolin (ANCEF) IVPB 1 g/50 mL premix     1 g 100 mL/hr over 30 Minutes Intravenous Every 8 hours 06/03/17 1100     05/31/17 0200  vancomycin (VANCOCIN) IVPB 750 mg/150 ml premix  Status:  Discontinued     750 mg 150 mL/hr over 60 Minutes Intravenous Every 12 hours 05/30/17 1331 06/03/17 1100   05/28/17 1000  vancomycin (VANCOCIN) 1,250 mg in sodium chloride 0.9 % 250 mL IVPB  Status:  Discontinued     1,250 mg 166.7 mL/hr over 90 Minutes Intravenous Every 12 hours 05/27/17 2026 05/30/17 1331   05/28/17 1000  piperacillin-tazobactam (ZOSYN) IVPB 3.375 g  Status:  Discontinued     3.375 g 12.5 mL/hr over 240 Minutes Intravenous Every 8 hours 05/28/17 0936 06/01/17 1902   05/27/17 2030  cefTRIAXone (ROCEPHIN) 2 g in dextrose 5 % 50 mL IVPB  Status:  Discontinued     2 g 100 mL/hr over 30 Minutes Intravenous Every 24 hours 05/27/17 2002 05/28/17 0936   05/27/17 2000  vancomycin (VANCOCIN) 2,500 mg in sodium chloride 0.9 % 500 mL IVPB     2,500 mg 250 mL/hr over 120 Minutes Intravenous  Once 05/27/17 1946 05/27/17 2241   05/27/17 1930  cefTRIAXone (ROCEPHIN) 1 g in dextrose 5 % 50 mL IVPB  Status:  Discontinued     1 g 100 mL/hr over 30 Minutes Intravenous  Once 05/27/17 1928 05/27/17 2017         Subjective: Feels like things are getting better.  Cold today (room is cold).  Objective: Vitals:   06/02/17 1115 06/02/17 1636 06/02/17 2052 06/03/17 0443  BP: 112/71 133/75 116/73 118/83  Pulse: 68 (!) 57 64 77  Resp:   18 20  Temp:   98.3 F (36.8 C) 98 F (36.7 C)  TempSrc:   Oral Oral  SpO2:  95% 95% 96%  Weight:    (!) 168.7 kg (371 lb 14.7 oz)  Height:        Intake/Output Summary (Last 24 hours) at 06/03/17 1101 Last data filed at 06/03/17  0446  Gross per 24 hour  Intake              150 ml  Output             2350 ml  Net            -2200 ml   Filed Weights   06/01/17 0523 06/02/17 0625 06/03/17 0443  Weight: (!) 167.3 kg (368 lb 13.3 oz) (!) 169.3 kg (373 lb 3.8 oz) (!) 168.7 kg (371 lb 14.7 oz)    Examination:  General: No acute distress. Cardiovascular: Heart sounds show Rashaud Ybarbo regular rate, and rhythm. No gallops or rubs. No murmurs. No JVD. Lungs: Clear to auscultation bilaterally with good air movement. No rales, rhonchi or wheezes. Abdomen: Soft, nontender, nondistended with normal active bowel sounds. No masses. No hepatosplenomegaly. Neurological: Alert and oriented 3. Moves all extremities 4 with equal strength. Cranial nerves II through XII grossly intact. Skin: induration maybe Quianna Avery bit worse than yesterday, erythema still within borders, slightly improved.  Extremities: No clubbing or cyanosis. 1+  LEE. Psychiatric: Mood and affect are normal. Insight and judgment are appropriate. 9/24    9/23   9/22    Data Reviewed: I have personally reviewed following labs and imaging studies  CBC:  Recent Labs Lab 05/27/17 1739 05/28/17 0503 05/29/17 0432 05/30/17 0425 06/01/17 0409  WBC 17.5* 13.3* 8.9 7.9 7.2  NEUTROABS  --   --  6.5  --   --   HGB 11.8* 11.5* 10.2* 11.4* 10.6*  HCT 35.8* 34.9* 30.6* 35.0* 32.5*  MCV 90.2 88.4 90.0 88.6 91.0  PLT 271 252 242 279 381   Basic Metabolic Panel:  Recent Labs Lab 05/27/17 1739 05/28/17 0503 05/29/17 0432 05/29/17 1417 05/30/17 0425 05/31/17 0420 06/01/17 0409 06/02/17 0434 06/03/17 0447  NA 135 135 139  --  134*  --  137  --   --   K 3.6 3.2* 3.7  --  3.5  --  3.8  --   --   CL 95* 98* 102  --  100*  --  102  --   --   CO2 31 28 30   --  27  --  27  --   --   GLUCOSE 222* 208* 176*  --  175*  --  233*  --   --   BUN 16 14 11   --  7  --  <5*  --   --   CREATININE 0.95 0.87 0.84  --  0.74 0.85 0.95 0.90 0.82  CALCIUM 8.4* 8.1* 8.1*  --  8.2*  --   8.2*  --   --   MG  --   --   --  1.8  --   --   --   --   --    GFR: Estimated Creatinine Clearance: 140.5 mL/min (by C-G formula based on SCr of 0.82 mg/dL). Liver Function Tests: No results for input(s): AST, ALT, ALKPHOS, BILITOT, PROT, ALBUMIN in the last 168 hours.  Recent Labs Lab 05/27/17 2028  LIPASE 15   No results for input(s): AMMONIA in the last 168 hours. Coagulation Profile:  Recent Labs Lab 05/27/17 2028  INR 1.08   Cardiac Enzymes: No results for input(s): CKTOTAL, CKMB, CKMBINDEX, TROPONINI in the last 168 hours. BNP (last 3 results) No results for input(s): PROBNP in the last 8760 hours. HbA1C: No results for input(s): HGBA1C in the last 72 hours. CBG:  Recent Labs Lab 06/02/17 0750 06/02/17 1212 06/02/17 1632 06/02/17 2050 06/03/17 0746  GLUCAP 182* 196* 201* 276* 183*   Lipid Profile: No results for input(s): CHOL, HDL, LDLCALC, TRIG, CHOLHDL, LDLDIRECT in the last 72 hours. Thyroid Function Tests: No results for input(s): TSH, T4TOTAL, FREET4, T3FREE, THYROIDAB in the last 72 hours. Anemia Panel:  Recent Labs  06/02/17 1029  VITAMINB12 357  FOLATE 19.0  FERRITIN 123  TIBC 227*  IRON 45   Sepsis Labs:  Recent Labs Lab 05/27/17 2028 05/27/17 2029 05/27/17 2242  PROCALCITON 0.13  --   --   LATICACIDVEN  --  1.1 1.3    Recent Results (from the past 240 hour(s))  Urine culture     Status: Abnormal   Collection Time: 05/27/17  7:20 PM  Result Value Ref Range Status   Specimen Description URINE, CLEAN CATCH  Final   Special Requests NONE  Final   Culture (Fabienne Nolasco)  Final    >=100,000 COLONIES/mL ESCHERICHIA COLI Confirmed Extended Spectrum Beta-Lactamase Producer (ESBL) Performed at [facility], [place],  [place] [address]    Report Status 05/30/2017 FINAL  Final   Organism ID, Bacteria ESCHERICHIA COLI (Briaunna [place])  Final      Susceptibility   Escherichia coli - MIC*    AMPICILLIN >=32 RESISTANT Resistant      CEFAZOLIN >=64 RESISTANT Resistant     CEFTRIAXONE >=64 RESISTANT Resistant     CIPROFLOXACIN >=4 RESISTANT Resistant     GENTAMICIN <=1 SENSITIVE Sensitive     IMIPENEM <=0.25 SENSITIVE Sensitive     NITROFURANTOIN <=16 SENSITIVE Sensitive     TRIMETH/SULFA <=20 SENSITIVE Sensitive     AMPICILLIN/SULBACTAM >=32 RESISTANT Resistant     PIP/TAZO 8 SENSITIVE Sensitive     Extended ESBL POSITIVE Resistant     * >=100,000 COLONIES/mL ESCHERICHIA COLI  Blood culture (routine x 2)     Status: None   Collection Time: 05/27/17  8:17 PM  Result Value Ref Range Status   Specimen Description BLOOD RIGHT HAND  Final   Special Requests   Final    BOTTLES DRAWN AEROBIC AND ANAEROBIC Blood Culture adequate volume   Culture   Final    NO GROWTH 5 DAYS Performed at [facility], [place] [address], [place], [place] [address]    Report Status 06/01/2017 FINAL  Final  Blood culture (routine x 2)     Status: None   Collection Time: 05/27/17  8:28 PM  Result Value Ref Range Status   Specimen Description BLOOD LEFT ANTECUBITAL  Final   Special Requests IN PEDIATRIC BOTTLE Blood Culture adequate volume  Final   Culture   Final    NO GROWTH 5 DAYS Performed at [facility], [place] [address], [place], [place] [address]    Report Status 06/01/2017 FINAL  Final  MRSA PCR Screening     Status: Abnormal   Collection Time: 05/27/17 11:35 PM  Result Value Ref Range Status   MRSA by PCR POSITIVE (Axzel Rockhill) NEGATIVE Final    Comment:        The GeneXpert MRSA Assay (FDA approved for NASAL specimens only), is one component of Jolie Strohecker comprehensive MRSA colonization surveillance program. It is not intended to diagnose MRSA infection nor to guide or monitor treatment for MRSA infections. RESULT CALLED TO, READ BACK BY AND VERIFIED WITHArna Snipe RN 0422 05/28/17 Xavyer Steenson [facility]          Radiology Studies: No results found.      Scheduled Meds: . amLODipine  5 mg Oral Daily  . aspirin EC  81 mg Oral  Daily  . chlorhexidine  15 mL Mouth Rinse BID  . clotrimazole   Topical BID  . enoxaparin (LOVENOX) injection  80 mg Subcutaneous QHS  . escitalopram  10 mg Oral Daily  . ezetimibe  10 mg Oral Daily  . folic acid  1 mg Oral Daily  . gabapentin  100 mg Oral BID  . guaiFENesin  600 mg Oral BID  . hydrOXYzine  25 mg Oral TID  . insulin aspart  0-9 Units Subcutaneous TID WC  . insulin glargine  28 Units Subcutaneous QHS  . mouth rinse  15 mL Mouth Rinse q12n4p  . metoprolol tartrate  12.5 mg Oral BID  . multivitamin with minerals  1 tablet Oral Q1500  . nystatin   Topical BID  . pantoprazole  40 mg Oral Daily  . predniSONE  10 mg Oral Daily  . rOPINIRole  0.5 mg Oral QHS  . senna-docusate  2 tablet Oral QHS  .  traZODone  50 mg Oral QHS   Continuous Infusions: .  ceFAZolin (ANCEF) IV       LOS: 7 days    Time spent: 25 min    Fayrene Helper, MD [facility]

## 2017-06-03 NOTE — Progress Notes (Signed)
Patient refused to have CBG retaken after 2 failed attempts DT: strip error

## 2017-06-04 LAB — GLUCOSE, CAPILLARY
GLUCOSE-CAPILLARY: 231 mg/dL — AB (ref 65–99)
GLUCOSE-CAPILLARY: 192 mg/dL — AB (ref 65–99)
GLUCOSE-CAPILLARY: 350 mg/dL — AB (ref 65–99)
Glucose-Capillary: 377 mg/dL — ABNORMAL HIGH (ref 65–99)

## 2017-06-04 LAB — CBC
HCT: 33.3 % — ABNORMAL LOW (ref 39.0–52.0)
HEMOGLOBIN: 10.6 g/dL — AB (ref 13.0–17.0)
MCH: 29 pg (ref 26.0–34.0)
MCHC: 31.8 g/dL (ref 30.0–36.0)
MCV: 91 fL (ref 78.0–100.0)
PLATELETS: 273 10*3/uL (ref 150–400)
RBC: 3.66 MIL/uL — AB (ref 4.22–5.81)
RDW: 13.5 % (ref 11.5–15.5)
WBC: 10 10*3/uL (ref 4.0–10.5)

## 2017-06-04 MED ORDER — INSULIN GLARGINE 100 UNIT/ML ~~LOC~~ SOLN
33.0000 [IU] | Freq: Every day | SUBCUTANEOUS | Status: DC
  Administered 2017-06-04: 33 [IU] via SUBCUTANEOUS
  Filled 2017-06-04 (×2): qty 0.33

## 2017-06-04 NOTE — Care Management Important Message (Signed)
Important Message  Patient Details IM Letter given to Cookie/Case Manager to present to Patient Name: [name] MRN: [id number] Date of Birth: 1945-12-31   Medicare Important Message Given:  Yes    Kerin Salen 06/04/2017, 10:25 AMImportant Message  Patient Details  Name: [name] MRN: [id number] Date of Birth: 1946/03/23   Medicare Important Message Given:  Yes    Kerin Salen 06/04/2017, 10:25 AM

## 2017-06-04 NOTE — Progress Notes (Signed)
PROGRESS NOTE    Rahn Lacuesta Nellums  GUR:[id number] DOB: 06/11/1946 DOA: 05/27/2017 PCP: Gildardo Cranker, DO    Brief Narrative: [name] Arling Cerone 71 y.o.malewith medical history significant of indwelling catheter, hypertension, hyperlipidemia, diabetes mellitus, stroke, GERD, depression, OSA not on CPAP, obesity, DVT 1980, dCHF, CAD, bullous pemphigus, who presents with fever, left hip redness and pain, urinary retention, dysuria. He was brought from his skilled nursing facility for request of Foley replacement. He has history of urinary retention and has Zoella Roberti chronic indwelling catheter. Over the past 45 years. Facility was unable to replace it, which brought him to the emergency department on 9/17. He also has left hip redness and pain which had been ongoing for several days, reported fevers and chills. Patient was admitted for left-sided leg cellulitis as well as catheter related UTI.  His cellulitis has been very slow to resolve.  He's received antibiotics since 9/17, but the redness and induration still present. Discussed with infectious disease who recommended switching from vancomycin to Ancef and also noted that with obesity often the redness and induration have slow improvement.   Hopeful that with another day or so of IV antibiotics there will be Arvie Bartholomew little more improvement.  He's been hemodynamically stable with normal WBC over the past few days.   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  Not significantly different than yesterday.   Discussed case with ID given slow improvement.  They noted given obesity, slow improvement of redness/induration can be expected.  They noted that ancef may be more effective than vancomycin for nonpurulent cellulitis.   - ancef (9/24 - )  - vancomycin (9/17- 9/24) - zosyn (9/18-9/23) ceftraixone x 1 dose.  - Blood cultures NGTD x 5 days -Urine culture -> e. Coli >100,000  (sensitive to zosyn), d/c'd zosyn after 5 days therapy (pt without clear sx of UTI with chronic foley)  -Chronic Foley in place (exchanged as noted below) -Check CT left hip to rule out underlying abscess (notable for cellulitis, but no obvious abscess, myofasciitis or pyomyositis)  Urinary retention and chronic indwelling Foley catheter -Catheter exchanged in the emergency department 9/17 - abx as above, now d/c'd  Syncope -Episode of syncope when he was transferred from bed to stretcher by EMS. No focal deficits, signs of stroke or seizure -?Etiology, perhaps vasovagal response. Patient nonambulatory at baseline  - repeat echo (EF 28-31%, grade 1 diastolic dysfunction) -Treat infection as above  Nonsustained Vtach  - 15 beats seen on tele on 9/19 - ctm on telemetry (PVC's, PAC's since) - replete electrolytes  Hypokalemia - improved  Anemia:  - c/w AOCD  Chronic diastolic heart failure - Appears euvolemic -Continue metoprolol  Abdominal pain, nausea, vomiting -CT abdomen pelvis unremarkable for acute pathology to explain abdominal pain -No further episodes -Advance diet to clears today  Moisture associated skin damage along bilateral buttocks -Wound RN consulted   Uncontrolled  type 2 diabetes with peripheral autonomic neuropathy -Lantus, sliding scale insulin -HA1c 9.8 -Neurontin   History of CVA -Continue aspirin, zetia  Bullous  pemphigus -Continue prednisone, methotrexate  HTN -Continue norvasc. Lopressor    DVT prophylaxis: lovenox Code Status:full  Family Communication: none at bedside Disposition Plan: to snf once improving  Consultants:   none  Procedures: (Don't include imaging studies which can be auto populated. Include things that cannot be auto populated i.e. Echo, Carotid and venous dopplers, Foley, Bipap, HD, tubes/drains, wound vac, central lines etc)  none  Antimicrobials: (specify start and planned stop date. Auto populated tables are space occupying and do not give end dates) Anti-infectives    Start     Dose/Rate Route Frequency Ordered Stop   06/03/17 1400  ceFAZolin (ANCEF) IVPB 2g/100 mL premix     2 g 200 mL/hr over 30 Minutes Intravenous Every 8 hours 06/03/17 1100     05/31/17 0200  vancomycin (VANCOCIN) IVPB 750 mg/150 ml premix  Status:  Discontinued     750 mg 150 mL/hr over 60 Minutes Intravenous Every 12 hours 05/30/17 1331 06/03/17 1100   05/28/17 1000  vancomycin (VANCOCIN) 1,250 mg in sodium chloride 0.9 % 250 mL IVPB  Status:  Discontinued     1,250 mg 166.7 mL/hr over 90 Minutes Intravenous Every 12 hours 05/27/17 2026 05/30/17 1331   05/28/17 1000  piperacillin-tazobactam (ZOSYN) IVPB 3.375 g  Status:  Discontinued     3.375 g 12.5 mL/hr over 240 Minutes Intravenous Every 8 hours 05/28/17 0936 06/01/17 1902   05/27/17 2030  cefTRIAXone (ROCEPHIN) 2 g in dextrose 5 % 50 mL IVPB  Status:  Discontinued     2 g 100 mL/hr over 30 Minutes Intravenous Every 24 hours 05/27/17 2002 05/28/17 0936   05/27/17 2000  vancomycin (VANCOCIN) 2,500 mg in sodium chloride 0.9 % 500 mL IVPB     2,500 mg 250 mL/hr over 120 Minutes Intravenous  Once 05/27/17 1946 05/27/17 2241   05/27/17 1930  cefTRIAXone (ROCEPHIN) 1 g in dextrose 5 % 50 mL IVPB  Status:  Discontinued     1 g 100 mL/hr over 30 Minutes Intravenous  Once 05/27/17 1928 05/27/17 2017         Subjective: Notes slow  improvement.  Still some tenderness.   Objective: Vitals:   06/03/17 0443 06/03/17 1319 06/03/17 2102 06/04/17 0559  BP: 118/83 112/71 103/86 119/78  Pulse: 77 69 75 67  Resp: 20 18 20 20   Temp: 98 F (36.7 C) 98.2 F (36.8 C) 98.3 F (36.8 C) 98.1 F (36.7 C)  TempSrc: Oral Oral Oral Oral  SpO2: 96% 98% 96% 97%  Weight: (!) 168.7 kg (371 lb 14.7 oz)   (!) 166.6 kg (367 lb 4.8 oz)  Height:        Intake/Output Summary (Last 24 hours) at 06/04/17 0958 Last data filed at 06/03/17 2108  Gross per 24 hour  Intake              100 ml  Output             2850 ml  Net            -2750 ml   Filed Weights   06/02/17 0625 06/03/17 0443 06/04/17 0559  Weight: (!) 169.3 kg (373 lb 3.8 oz) (!) 168.7 kg (371 lb 14.7 oz) (!) 166.6 kg (367 lb 4.8 oz)    Examination:  General: No acute distress. Cardiovascular: Heart sounds show Tenille Morrill  regular rate, and rhythm. No gallops or rubs. No murmurs. No JVD. Lungs: Clear to auscultation bilaterally with good air movement. No rales, rhonchi or wheezes. Abdomen: Soft, nontender, nondistended with normal active bowel sounds. No masses. No hepatosplenomegaly. Neurological: Alert and oriented 3. Moves all extremities 4 with equal strength. Cranial nerves II through XII grossly intact. Skin: erythema within borders on L hip, induration present as well.  Seems better when compared to previous images.  Extremities: No clubbing or cyanosis. No edema. Pedal pulses 2+. Psychiatric: Mood and affect are normal. Insight and judgment are appropriate.  9/25    9/24    9/23   9/22    Data Reviewed: I have personally reviewed following labs and imaging studies  CBC:  Recent Labs Lab 05/29/17 0432 05/30/17 0425 06/01/17 0409  WBC 8.9 7.9 7.2  NEUTROABS 6.5  --   --   HGB 10.2* 11.4* 10.6*  HCT 30.6* 35.0* 32.5*  MCV 90.0 88.6 91.0  PLT 242 279 580   Basic Metabolic Panel:  Recent Labs Lab 05/29/17 0432 05/29/17 1417 05/30/17 0425  05/31/17 0420 06/01/17 0409 06/02/17 0434 06/03/17 0447  NA 139  --  134*  --  137  --   --   K 3.7  --  3.5  --  3.8  --   --   CL 102  --  100*  --  102  --   --   CO2 30  --  27  --  27  --   --   GLUCOSE 176*  --  175*  --  233*  --   --   BUN 11  --  7  --  <5*  --   --   CREATININE 0.84  --  0.74 0.85 0.95 0.90 0.82  CALCIUM 8.1*  --  8.2*  --  8.2*  --   --   MG  --  1.8  --   --   --   --   --    GFR: Estimated Creatinine Clearance: 139.5 mL/min (by C-G formula based on SCr of 0.82 mg/dL). Liver Function Tests: No results for input(s): AST, ALT, ALKPHOS, BILITOT, PROT, ALBUMIN in the last 168 hours. No results for input(s): LIPASE, AMYLASE in the last 168 hours. No results for input(s): AMMONIA in the last 168 hours. Coagulation Profile: No results for input(s): INR, PROTIME in the last 168 hours. Cardiac Enzymes: No results for input(s): CKTOTAL, CKMB, CKMBINDEX, TROPONINI in the last 168 hours. BNP (last 3 results) No results for input(s): PROBNP in the last 8760 hours. HbA1C: No results for input(s): HGBA1C in the last 72 hours. CBG:  Recent Labs Lab 06/02/17 2050 06/03/17 0746 06/03/17 1134 06/03/17 2058 06/04/17 0813  GLUCAP 276* 183* 204* 245* 231*   Lipid Profile: No results for input(s): CHOL, HDL, LDLCALC, TRIG, CHOLHDL, LDLDIRECT in the last 72 hours. Thyroid Function Tests: No results for input(s): TSH, T4TOTAL, FREET4, T3FREE, THYROIDAB in the last 72 hours. Anemia Panel:  Recent Labs  06/02/17 1029  VITAMINB12 357  FOLATE 19.0  FERRITIN 123  TIBC 227*  IRON 45   Sepsis Labs: No results for input(s): PROCALCITON, LATICACIDVEN in the last 168 hours.  Recent Results (from the past 240 hour(s))  Urine culture     Status: Abnormal   Collection Time: 05/27/17  7:20 PM  Result Value Ref Range Status   Specimen Description URINE, CLEAN CATCH  Final   Special Requests NONE  Final  Culture (Chrishawn Kring)  Final    >=100,000 COLONIES/mL ESCHERICHIA  COLI Confirmed Extended Spectrum Beta-Lactamase Producer (ESBL) Performed at [facility], [place] [address], [place], [place] [address]    Report Status 05/30/2017 FINAL  Final   Organism ID, Bacteria ESCHERICHIA COLI (Crislyn Willbanks)  Final      Susceptibility   Escherichia coli - MIC*    AMPICILLIN >=32 RESISTANT Resistant     CEFAZOLIN >=64 RESISTANT Resistant     CEFTRIAXONE >=64 RESISTANT Resistant     CIPROFLOXACIN >=4 RESISTANT Resistant     GENTAMICIN <=1 SENSITIVE Sensitive     IMIPENEM <=0.25 SENSITIVE Sensitive     NITROFURANTOIN <=16 SENSITIVE Sensitive     TRIMETH/SULFA <=20 SENSITIVE Sensitive     AMPICILLIN/SULBACTAM >=32 RESISTANT Resistant     PIP/TAZO 8 SENSITIVE Sensitive     Extended ESBL POSITIVE Resistant     * >=100,000 COLONIES/mL ESCHERICHIA COLI  Blood culture (routine x 2)     Status: None   Collection Time: 05/27/17  8:17 PM  Result Value Ref Range Status   Specimen Description BLOOD RIGHT HAND  Final   Special Requests   Final    BOTTLES DRAWN AEROBIC AND ANAEROBIC Blood Culture adequate volume   Culture   Final    NO GROWTH 5 DAYS Performed at [facility], [place] [address], [place], [place] [address]    Report Status 06/01/2017 FINAL  Final  Blood culture (routine x 2)     Status: None   Collection Time: 05/27/17  8:28 PM  Result Value Ref Range Status   Specimen Description BLOOD LEFT ANTECUBITAL  Final   Special Requests IN PEDIATRIC BOTTLE Blood Culture adequate volume  Final   Culture   Final    NO GROWTH 5 DAYS Performed at [facility], [place] [address] [place],  [address]    Report Status 06/01/2017 FINAL  Final  MRSA PCR Screening     Status: Abnormal   Collection Time: 05/27/17 11:35 PM  Result Value Ref Range Status   MRSA by PCR POSITIVE (Mackenzy Eisenberg) NEGATIVE Final    Comment:        The GeneXpert MRSA Assay (FDA approved for NASAL specimens only), is one component of Analyssa Downs comprehensive MRSA colonization surveillance program. It  is not intended to diagnose MRSA infection nor to guide or monitor treatment for MRSA infections. RESULT CALLED TO, READ BACK BY AND VERIFIED WITHArna Snipe RN 0422 05/28/17 [facility]          Radiology Studies: No results found.      Scheduled Meds: . amLODipine  5 mg Oral Daily  . aspirin EC  81 mg Oral Daily  . chlorhexidine  15 mL Mouth Rinse BID  . clotrimazole   Topical BID  . enoxaparin (LOVENOX) injection  80 mg Subcutaneous QHS  . escitalopram  10 mg Oral Daily  . ezetimibe  10 mg Oral Daily  . folic acid  1 mg Oral Daily  . gabapentin  100 mg Oral BID  . guaiFENesin  600 mg Oral BID  . hydrOXYzine  25 mg Oral TID  . insulin aspart  0-9 Units Subcutaneous TID WC  . insulin glargine  28 Units Subcutaneous QHS  . mouth rinse  15 mL Mouth Rinse q12n4p  . metoprolol tartrate  12.5 mg Oral BID  . multivitamin with minerals  1 tablet Oral Q1500  . nystatin   Topical BID  . pantoprazole  40 mg  Oral Daily  . predniSONE  10 mg Oral Daily  . rOPINIRole  0.5 mg Oral QHS  . senna-docusate  2 tablet Oral QHS  . traZODone  50 mg Oral QHS   Continuous Infusions: .  ceFAZolin (ANCEF) IV Stopped (06/04/17 3382)     LOS: 8 days    Time spent: 25 min    Fayrene Helper, MD [facility]

## 2017-06-05 DIAGNOSIS — T83091A Other mechanical complication of indwelling urethral catheter, initial encounter: Secondary | ICD-10-CM | POA: Diagnosis not present

## 2017-06-05 DIAGNOSIS — I82891 Chronic embolism and thrombosis of other specified veins: Secondary | ICD-10-CM | POA: Diagnosis not present

## 2017-06-05 DIAGNOSIS — I110 Hypertensive heart disease with heart failure: Secondary | ICD-10-CM | POA: Diagnosis not present

## 2017-06-05 DIAGNOSIS — E1143 Type 2 diabetes mellitus with diabetic autonomic (poly)neuropathy: Secondary | ICD-10-CM | POA: Diagnosis not present

## 2017-06-05 DIAGNOSIS — E1165 Type 2 diabetes mellitus with hyperglycemia: Secondary | ICD-10-CM | POA: Diagnosis not present

## 2017-06-05 DIAGNOSIS — F339 Major depressive disorder, recurrent, unspecified: Secondary | ICD-10-CM | POA: Diagnosis not present

## 2017-06-05 DIAGNOSIS — E785 Hyperlipidemia, unspecified: Secondary | ICD-10-CM | POA: Diagnosis not present

## 2017-06-05 DIAGNOSIS — G2581 Restless legs syndrome: Secondary | ICD-10-CM | POA: Diagnosis not present

## 2017-06-05 DIAGNOSIS — E1140 Type 2 diabetes mellitus with diabetic neuropathy, unspecified: Secondary | ICD-10-CM | POA: Diagnosis not present

## 2017-06-05 DIAGNOSIS — L120 Bullous pemphigoid: Secondary | ICD-10-CM | POA: Diagnosis not present

## 2017-06-05 DIAGNOSIS — A419 Sepsis, unspecified organism: Secondary | ICD-10-CM

## 2017-06-05 DIAGNOSIS — B351 Tinea unguium: Secondary | ICD-10-CM | POA: Diagnosis not present

## 2017-06-05 DIAGNOSIS — R1311 Dysphagia, oral phase: Secondary | ICD-10-CM | POA: Diagnosis not present

## 2017-06-05 DIAGNOSIS — Z8673 Personal history of transient ischemic attack (TIA), and cerebral infarction without residual deficits: Secondary | ICD-10-CM | POA: Diagnosis not present

## 2017-06-05 DIAGNOSIS — I5030 Unspecified diastolic (congestive) heart failure: Secondary | ICD-10-CM | POA: Diagnosis not present

## 2017-06-05 DIAGNOSIS — L03119 Cellulitis of unspecified part of limb: Secondary | ICD-10-CM | POA: Diagnosis not present

## 2017-06-05 DIAGNOSIS — L89323 Pressure ulcer of left buttock, stage 3: Secondary | ICD-10-CM | POA: Diagnosis not present

## 2017-06-05 DIAGNOSIS — N390 Urinary tract infection, site not specified: Secondary | ICD-10-CM | POA: Diagnosis not present

## 2017-06-05 DIAGNOSIS — N319 Neuromuscular dysfunction of bladder, unspecified: Secondary | ICD-10-CM | POA: Diagnosis not present

## 2017-06-05 DIAGNOSIS — E669 Obesity, unspecified: Secondary | ICD-10-CM | POA: Diagnosis not present

## 2017-06-05 DIAGNOSIS — I5032 Chronic diastolic (congestive) heart failure: Secondary | ICD-10-CM | POA: Diagnosis not present

## 2017-06-05 DIAGNOSIS — E08628 Diabetes mellitus due to underlying condition with other skin complications: Secondary | ICD-10-CM | POA: Diagnosis not present

## 2017-06-05 DIAGNOSIS — Z794 Long term (current) use of insulin: Secondary | ICD-10-CM | POA: Diagnosis not present

## 2017-06-05 DIAGNOSIS — L109 Pemphigus, unspecified: Secondary | ICD-10-CM | POA: Diagnosis not present

## 2017-06-05 DIAGNOSIS — K219 Gastro-esophageal reflux disease without esophagitis: Secondary | ICD-10-CM | POA: Diagnosis not present

## 2017-06-05 DIAGNOSIS — M6281 Muscle weakness (generalized): Secondary | ICD-10-CM | POA: Diagnosis not present

## 2017-06-05 DIAGNOSIS — I10 Essential (primary) hypertension: Secondary | ICD-10-CM | POA: Diagnosis not present

## 2017-06-05 DIAGNOSIS — R6520 Severe sepsis without septic shock: Secondary | ICD-10-CM | POA: Diagnosis not present

## 2017-06-05 DIAGNOSIS — G4733 Obstructive sleep apnea (adult) (pediatric): Secondary | ICD-10-CM | POA: Diagnosis not present

## 2017-06-05 DIAGNOSIS — E1141 Type 2 diabetes mellitus with diabetic mononeuropathy: Secondary | ICD-10-CM | POA: Diagnosis not present

## 2017-06-05 DIAGNOSIS — G4700 Insomnia, unspecified: Secondary | ICD-10-CM | POA: Diagnosis not present

## 2017-06-05 DIAGNOSIS — F329 Major depressive disorder, single episode, unspecified: Secondary | ICD-10-CM | POA: Diagnosis not present

## 2017-06-05 DIAGNOSIS — E1169 Type 2 diabetes mellitus with other specified complication: Secondary | ICD-10-CM | POA: Diagnosis not present

## 2017-06-05 DIAGNOSIS — L03116 Cellulitis of left lower limb: Secondary | ICD-10-CM | POA: Diagnosis not present

## 2017-06-05 DIAGNOSIS — R55 Syncope and collapse: Secondary | ICD-10-CM | POA: Diagnosis not present

## 2017-06-05 DIAGNOSIS — D649 Anemia, unspecified: Secondary | ICD-10-CM | POA: Diagnosis not present

## 2017-06-05 DIAGNOSIS — I5033 Acute on chronic diastolic (congestive) heart failure: Secondary | ICD-10-CM | POA: Diagnosis not present

## 2017-06-05 DIAGNOSIS — E876 Hypokalemia: Secondary | ICD-10-CM | POA: Diagnosis not present

## 2017-06-05 DIAGNOSIS — M19012 Primary osteoarthritis, left shoulder: Secondary | ICD-10-CM | POA: Diagnosis not present

## 2017-06-05 DIAGNOSIS — I509 Heart failure, unspecified: Secondary | ICD-10-CM | POA: Diagnosis not present

## 2017-06-05 DIAGNOSIS — M25512 Pain in left shoulder: Secondary | ICD-10-CM | POA: Diagnosis not present

## 2017-06-05 DIAGNOSIS — T83511S Infection and inflammatory reaction due to indwelling urethral catheter, sequela: Secondary | ICD-10-CM | POA: Diagnosis not present

## 2017-06-05 DIAGNOSIS — S46012S Strain of muscle(s) and tendon(s) of the rotator cuff of left shoulder, sequela: Secondary | ICD-10-CM | POA: Diagnosis not present

## 2017-06-05 LAB — GLUCOSE, CAPILLARY
GLUCOSE-CAPILLARY: 188 mg/dL — AB (ref 65–99)
Glucose-Capillary: 226 mg/dL — ABNORMAL HIGH (ref 65–99)

## 2017-06-05 MED ORDER — ACETAMINOPHEN 325 MG PO TABS: 650.0000 mg | ORAL_TABLET | Freq: Four times a day (QID) | ORAL | 0 refills | Status: DC | PRN

## 2017-06-05 MED ORDER — DOXYCYCLINE HYCLATE 100 MG PO TABS: 100.0000 mg | ORAL_TABLET | Freq: Two times a day (BID) | ORAL | 0 refills | Status: DC

## 2017-06-05 MED ORDER — INSULIN LISPRO 100 UNIT/ML ~~LOC~~ SOLN: 10.0000 [IU] | Freq: Three times a day (TID) | SUBCUTANEOUS | 11 refills | Status: DC

## 2017-06-05 MED ORDER — INSULIN GLARGINE 100 UNIT/ML ~~LOC~~ SOLN: 33.0000 [IU] | Freq: Every day | SUBCUTANEOUS | 11 refills | Status: DC

## 2017-06-05 NOTE — Progress Notes (Signed)
Pt returning to [facility] room 216B, report [phone]. (Pt is long term resident there) Will transport via [facility]- completed medical necessity form and arranged transportation. All information provided to facility via the [place]. No family contact requested.  Sharren Bridge, MSW, LCSW Clinical Social Work 06/05/2017 [phone]

## 2017-06-05 NOTE — Discharge Summary (Signed)
Physician Discharge Summary  [name] UVO:[id number] DOB: [birth year] DOA: 05/27/2017  PCP: Gildardo Cranker, DO  Admit date: 05/27/2017 Discharge date: 06/05/2017  Recommendations for Outpatient Follow-up:  Continue doxycycline for 10 days on discharge Resumed insulin: Lantus 33 units at bedtime and changed novolog 10 units TID AC but increase if CBG uncontrolled  Discharge Diagnoses:  Principal Problem:   Sepsis ([place]) Active Problems:   Chronic indwelling Foley catheter   Chronic diastolic CHF (congestive heart failure) (HCC)   Abdominal pain   UTI (urinary tract infection) due to urinary indwelling catheter ([place])   Hyperlipidemia LDL goal <70   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 He was brought from his skilled nursing facility for request of Foley replacement. He has history of urinary retention and has a chronic indwelling catheter. Over the past 45 years. Facility was unable to replace it, which brought him to the emergency department on 9/17. He also has left hip redness and pain which had been ongoing for several days, reported fevers and chills. Patient was admitted for left-sided legcellulitis as well as catheter related UTI. His cellulitis has been very slow to resolve.  He's received antibiotics since 9/17, but the redness and induration still present. Discussed with  infectious disease who recommended switching from vancomycin to Ancef and also noted that with obesity often the redness and induration have slow improvement.   Hopeful that with another day or so of IV antibiotics there will be a little more improvement.  He's been hemodynamically stable with normal WBC over the past few days. "  Hospital Course:   Sepsis secondary to left hip cellulitis, catheter related urinary tract infection, present on admission Slowly improving.  Not significantly different than yesterday.   Discussed case with ID given slow improvement.  They noted given obesity, slow improvement of redness/induration can be expected.  They noted that ancef may be more effective than vancomycin for nonpurulent cellulitis.   - ancef (9/24 - )  - vancomycin (9/17- 9/24) - zosyn (9/18-9/23) ceftraixone x 1 dose.  - Blood cultures NGTD x 5 days -Urine culture -> e. Coli >100,000  (sensitive to zosyn), d/c'd zosyn after 5 days therapy (pt without clear sx of UTI with chronic foley)  -Chronic Foley in place (exchanged as noted below) - CT left hip to rule out underlying abscess (notable for cellulitis, but no obvious abscess, myofasciitis or pyomyositis) - Cellulitis much better this am so pt can continue doxycycline for 10 days on discharge  Urinary retention and chronic indwelling Foley catheter - Catheter exchanged in the emergency department 9/17 - Abx for UTI stopped   Syncope - Episode of syncope when he was transferred from bed to stretcherby EMS. No focal deficits, signs of stroke or seizure - ?Etiology, perhaps vasovagal response. Patient nonambulatory at baseline  - repeat echo (EF 74-25%, grade 1 diastolic dysfunction) - Stable, no syncopal events since   Nonsustained Vtach  - 15 beats  seen on tele on 9/19 - ctm on telemetry (PVC's, PAC's since) - Stable   Hypokalemia - Due to torsemide - Supplemented   Anemia of chronic disease - Hgb stable   Chronic  diastolic heart failure - Compensated   Abdominal pain, nausea, vomiting - CT abdomen pelvis unremarkable for acute pathology to explain abdominal pain - Improved   Moisture associated skin damage along bilateral buttocks - Per RN management   Uncontrolled type 2 diabetes with peripheral autonomic neuropathy - Continue Latus 3 U at bedtime and novolog 10 units TID  History of CVA - Continue aspirin, zetia  Bullouspemphigus - Continue prednisone and MTX  HTN - Continue metoprolol, norvasc   DVT prophylaxis: Lovenox subQ Code Status:full  Family Communication: no family at the bedside   Consultants:   None   Procedures:  None   Antimicrobial Doxycycline for 10 days on discharge    Signed:  Leisa Lenz, MD  [facility] 06/05/2017, 12:44 PM  Pager #: [phone]  Time spent in minutes: more than 30 minutes    Discharge Exam: Vitals:   06/04/17 2115 06/05/17 0543  BP: 114/79 118/87  Pulse: 64 63  Resp: 18 16  Temp: 97.9 F (36.6 C) (!) 97.5 F (36.4 C)  SpO2: 98% 96%   Vitals:   06/04/17 0559 06/04/17 1509 06/04/17 2115 06/05/17 0543  BP: 119/78 118/70 114/79 118/87  Pulse: 67 70 64 63  Resp: 20 19 18 16   Temp: 98.1 F (36.7 C) 98.6 F (37 C) 97.9 F (36.6 C) (!) 97.5 F (36.4 C)  TempSrc: Oral Oral Oral Oral  SpO2: 97% 98% 98% 96%  Weight: (!) 166.6 kg (367 lb 4.8 oz)   (!) 168 kg (370 lb 6 oz)  Height:        General: Pt is alert, follows commands appropriately, not in acute distress Cardiovascular: Regular rate and rhythm, S1/S2 + Respiratory: Clear to auscultation bilaterally, no wheezing, no crackles, no rhonchi Abdominal: Soft, non tender, non distended, bowel sounds +, no guarding Extremities: cellulitis significantly better  Neuro: Grossly nonfocal  Discharge Instructions  Discharge Instructions    Call MD for:  difficulty breathing, headache or visual disturbances    Complete by:  As directed    Call MD  for:  redness, tenderness, or signs of infection (pain, swelling, redness, odor or green/yellow discharge around incision site)    Complete by:  As directed    Call MD for:  severe uncontrolled pain    Complete by:  As directed    Diet - low sodium heart healthy    Complete by:  As directed    Discharge instructions    Complete by:  As directed    Continue doxycycline for 10 days on discharge Resumed insulin: Lantus 33 units at bedtime and changed novolog 10 units TID AC but increase if CBG uncontrolled   Increase activity slowly    Complete by:  As directed      Allergies as of 06/05/2017      Reactions   Ace Inhibitors Swelling   Pt tolerates lisinopril   Lipitor [atorvastatin Calcium] Swelling   Metformin And Related Swelling   Morphine And Related Other (See Comments)   Sweating, feels like is "in rocky boat."   Robaxin [methocarbamol] Other (See Comments)   Feels like he is shaky      Medication List    STOP taking these medications   traMADol-acetaminophen 37.5-325 MG tablet Commonly known as:  ULTRACET  TAKE these medications   acetaminophen 325 MG tablet Commonly known as:  TYLENOL Take 2 tablets (650 mg total) by mouth every 6 (six) hours as needed for mild pain (or Fever >/= 101).   aluminum-magnesium hydroxide-simethicone 161-096-04 MG/5ML Susp Commonly known as:  MAALOX Take 30 mLs by mouth every 6 (six) hours as needed.   aspirin EC 81 MG tablet Take 81 mg by mouth daily. 0900   Clotrimazole 1 % Oint Apply to trunk topically every day and night shift for itching   DECUBI-VITE Caps Take 1 capsule by mouth daily at 3 pm.   doxycycline 100 MG tablet Commonly known as:  VIBRA-TABS Take 1 tablet (100 mg total) by mouth 2 (two) times daily.   escitalopram 10 MG tablet Commonly known as:  LEXAPRO Take 10 mg by mouth daily.   ezetimibe 10 MG tablet Commonly known as:  ZETIA Take 10 mg by mouth daily.   folic acid 1 MG tablet Commonly known as:   FOLVITE Take 1 mg by mouth daily.   gabapentin 100 MG capsule Commonly known as:  NEURONTIN Take 1 capsule (100 mg total) by mouth 2 (two) times daily.   guaiFENesin 600 MG 12 hr tablet Commonly known as:  MUCINEX Take 600 mg by mouth 2 (two) times daily.   hydrOXYzine 25 MG tablet Commonly known as:  ATARAX/VISTARIL Take 25 mg by mouth 3 (three) times daily.   insulin lispro 100 UNIT/ML injection Commonly known as:  HUMALOG Inject 0.1 mLs (10 Units total) into the skin 3 (three) times daily before meals. What changed:  how much to take   ipratropium-albuterol 0.5-2.5 (3) MG/3ML Soln Commonly known as:  DUONEB Take 3 mLs by nebulization every 6 (six) hours as needed (sob).   methotrexate 10 MG tablet Commonly known as:  RHEUMATREX Take 10 mg by mouth every Wednesday. Caution: Chemotherapy. Protect from light.   metoprolol tartrate 25 MG tablet Commonly known as:  LOPRESSOR Take 12.5 mg by mouth 2 (two) times daily.   nitroGLYCERIN 0.4 MG SL tablet Commonly known as:  NITROSTAT Place 0.4 mg under the tongue every 5 (five) minutes as needed for chest pain.   NORVASC 5 MG tablet Generic drug:  amLODipine Take 5 mg by mouth daily.   Nystatin Powd Apply 1 application transdermally two times a day for rash.  Apply to reddened areas under right side folds   omeprazole 20 MG capsule Commonly known as:  PRILOSEC Take 20 mg by mouth daily.   OXYGEN Inhale into the lungs. O2 at 2l/min via nasal canula as needed for shortness of breath   potassium chloride 20 MEQ packet Commonly known as:  KLOR-CON Take 40 mEq by mouth daily. 0800   predniSONE 10 MG tablet Commonly known as:  DELTASONE Take 10 mg by mouth daily.   rOPINIRole 0.5 MG tablet Commonly known as:  REQUIP Take 0.5 mg by mouth at bedtime. 2100   sennosides-docusate sodium 8.6-50 MG tablet Commonly known as:  SENOKOT-S Take 2 tablets by mouth at bedtime. 2100   torsemide 20 MG tablet Commonly known as:   DEMADEX Take 20 mg by mouth 2 (two) times daily.   TOUJEO SOLOSTAR 300 UNIT/ML Sopn Generic drug:  Insulin Glargine Inject 40 Units into the skin at bedtime. What changed:  Another medication with the same name was added. Make sure you understand how and when to take each.   insulin glargine 100 UNIT/ML injection Commonly known as:  LANTUS Inject 0.33 mLs (33  Units total) into the skin at bedtime. What changed:  You were already taking a medication with the same name, and this prescription was added. Make sure you understand how and when to take each.   traZODone 50 MG tablet Commonly known as:  DESYREL Take 50 mg by mouth at bedtime.            Discharge Care Instructions        Start     Ordered   06/05/17 0000  acetaminophen (TYLENOL) 325 MG tablet  Every 6 hours PRN     06/05/17 1243   06/05/17 0000  insulin glargine (LANTUS) 100 UNIT/ML injection  Daily at bedtime     06/05/17 1243   06/05/17 0000  insulin lispro (HUMALOG) 100 UNIT/ML injection  3 times daily before meals     06/05/17 1243   06/05/17 0000  doxycycline (VIBRA-TABS) 100 MG tablet  2 times daily     06/05/17 1243   06/05/17 0000  Increase activity slowly     06/05/17 1243   06/05/17 0000  Diet - low sodium heart healthy     06/05/17 1243   06/05/17 0000  Discharge instructions    Comments:  Continue doxycycline for 10 days on discharge Resumed insulin: Lantus 33 units at bedtime and changed novolog 10 units TID AC but increase if CBG uncontrolled   06/05/17 1243   06/05/17 0000  Call MD for:  severe uncontrolled pain     06/05/17 1243   06/05/17 0000  Call MD for:  redness, tenderness, or signs of infection (pain, swelling, redness, odor or green/yellow discharge around incision site)     06/05/17 1243   06/05/17 0000  Call MD for:  difficulty breathing, headache or visual disturbances     06/05/17 1243      Contact information for follow-up providers    Gildardo Cranker, DO Follow up.    Specialty:  Internal Medicine Contact information: [place] [address] [phone]            Contact information for after-discharge care    Destination    [facility] Follow up.   Specialty:  [place] information: [address] [phone]                   The results of significant diagnostics from this hospitalization (including imaging, microbiology, ancillary and laboratory) are listed below for reference.    Significant Diagnostic Studies: Ct Hip Left W Contrast  Result Date: 05/28/2017 CLINICAL DATA:  Left hip pain, swelling and redness for several days. EXAM: CT OF THE LOWER LEFT EXTREMITY WITH CONTRAST TECHNIQUE: Multidetector CT imaging of the lower left extremity was performed according to the standard protocol following intravenous contrast administration. COMPARISON:  05/27/2017 CONTRAST:  155mL ISOVUE-300 IOPAMIDOL (ISOVUE-300) INJECTION 61% FINDINGS: Moderate left hip joint degenerative changes with joint space narrowing, osteophytic spurring and subchondral cystic change. No fracture or AVN is demonstrated. No obvious hip joint effusion or destructive bony changes to suggest septic arthritis. Severe right hip joint degenerative changes are noted. Borderline left inguinal lymph nodes are noted. No inguinal mass or hernia. The major vascular structures appear patent. No significant intrapelvic abnormalities are identified. Air in the bladder from Foley catheter. The visualized bony pelvis is intact. No obvious sacral or pubic rami fractures. Artifact is noted from the patient's skin touching the gantry. There is mild skin thickening but  I do not see any significant subcutaneous abnormalities. No abscess. No findings for myofasciitis or pyomyositis. No evidence of osteomyelitis. IMPRESSION: 1. Bilateral hip joint degenerative changes, right greater than left  but no hip fracture, AVN or findings to suggest septic arthritis. 2. Significant artifact from the patient's skin touching the gantry. Suspect mild cellulitis but no obvious abscess, myofasciitis or pyomyositis. Electronically Signed   By: Marijo Sanes M.D.   On: 05/28/2017 16:14   Ct Abdomen Pelvis W Contrast  Result Date: 05/27/2017 CLINICAL DATA:  Indwelling catheter fever in left hip redness and pain, urinary retention EXAM: CT ABDOMEN AND PELVIS WITH CONTRAST TECHNIQUE: Multidetector CT imaging of the abdomen and pelvis was performed using the standard protocol following bolus administration of intravenous contrast. CONTRAST:  111mL ISOVUE-300 IOPAMIDOL (ISOVUE-300) INJECTION 61% COMPARISON:  05/12/2015 FINDINGS: Lower chest: Lung bases demonstrate no acute consolidation or pleural effusion. Linear atelectasis or scar at the right base. Borderline enlarged heart. Aortic and mitral calcifications. Hepatobiliary: Surgical clips at the gallbladder fossa. No focal hepatic abnormality or biliary dilatation Pancreas: Unremarkable. No pancreatic ductal dilatation or surrounding inflammatory changes. Spleen: Normal in size without focal abnormality. Adrenals/Urinary Tract: Right adrenal gland normal. 2.3 cm fat density mass in the left adrenal gland consistent with myelolipoma. Subcentimeter hypodensities in both kidneys too small to characterize. Negative for hydronephrosis. Slight decreased density of left kidney but excretion present on delayed images. Foley catheter is present with the balloon in the bladder base. Bladder is empty. The tip of the Foley catheter tents the superior wall of the bladder. Stomach/Bowel: Postsurgical changes of the stomach. Multiple surgical clips in the gastrosplenic region. No dilated small bowel. No colon wall thickening. Normal appendix. Vascular/Lymphatic: Aortic atherosclerosis. No enlarged abdominal or pelvic lymph nodes. Reproductive: No masses Other: Negative for free air  or free fluid. Musculoskeletal: Degenerative changes. Grade 1 anterolisthesis of L4 on L5. Chronic wedging deformities at T10, T11 and T12. Left hip is incompletely included in the field of view. IMPRESSION: 1. Negative for hydronephrosis. Nonspecific perinephric fat stranding. Foley catheter is present in the bladder which is empty, the tip of the catheter appears to tent the superior wall of the bladder. 2. There are no acute intra-abdominal or pelvic abnormalities seen. 3. 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   On: 05/27/2017 21:32    Microbiology: Recent Results (from the past 240 hour(s))  Urine culture     Status: Abnormal   Collection Time: 05/27/17  7:20 PM  Result Value Ref Range Status   Specimen Description URINE, CLEAN CATCH  Final   Special Requests NONE  Final   Culture (A)  Final    >=100,000 COLONIES/mL ESCHERICHIA COLI Confirmed Extended Spectrum Beta-Lactamase Producer (ESBL) Performed at [facility], [address] [place], [place] [address]    Report Status 05/30/2017 FINAL  Final   Organism ID, Bacteria ESCHERICHIA COLI (A)  Final      Susceptibility   Escherichia coli - MIC*    AMPICILLIN >=32 RESISTANT Resistant     CEFAZOLIN >=64 RESISTANT Resistant     CEFTRIAXONE >=64 RESISTANT Resistant     CIPROFLOXACIN >=4 RESISTANT Resistant     GENTAMICIN <=1 SENSITIVE Sensitive     IMIPENEM <=0.25 SENSITIVE Sensitive     NITROFURANTOIN <=16 SENSITIVE Sensitive     TRIMETH/SULFA <=20 SENSITIVE Sensitive     AMPICILLIN/SULBACTAM >=32 RESISTANT Resistant     PIP/TAZO 8 SENSITIVE Sensitive  Extended ESBL POSITIVE Resistant     * >=100,000 COLONIES/mL ESCHERICHIA COLI  Blood culture (routine x 2)     Status: None   Collection Time: 05/27/17  8:17 PM  Result Value Ref Range Status   Specimen Description BLOOD RIGHT HAND  Final   Special Requests   Final    BOTTLES DRAWN AEROBIC AND ANAEROBIC Blood Culture  adequate volume   Culture   Final    NO GROWTH 5 DAYS Performed at [facility], [place] [address], [place], [place] [address]    Report Status 06/01/2017 FINAL  Final  Blood culture (routine x 2)     Status: None   Collection Time: 05/27/17  8:28 PM  Result Value Ref Range Status   Specimen Description BLOOD LEFT ANTECUBITAL  Final   Special Requests IN PEDIATRIC BOTTLE Blood Culture adequate volume  Final   Culture   Final    NO GROWTH 5 DAYS Performed at [facility], [place] [address], [place], [place] [address]    Report Status 06/01/2017 FINAL  Final  MRSA PCR Screening     Status: Abnormal   Collection Time: 05/27/17 11:35 PM  Result Value Ref Range Status   MRSA by PCR POSITIVE (A) NEGATIVE Final    Comment:        The GeneXpert MRSA Assay (FDA approved for NASAL specimens only), is one component of a comprehensive MRSA colonization surveillance program. It is not intended to diagnose MRSA infection nor to guide or monitor treatment for MRSA infections. RESULT CALLED TO, READ BACK BY AND VERIFIED WITH: B HOLT RN 0422 05/28/17 A NAVARRO      Labs: Basic Metabolic Panel:  Recent Labs Lab 05/29/17 1417 05/30/17 0425 05/31/17 0420 06/01/17 0409 06/02/17 0434 06/03/17 0447  NA  --  134*  --  137  --   --   K  --  3.5  --  3.8  --   --   CL  --  100*  --  102  --   --   CO2  --  27  --  27  --   --   GLUCOSE  --  175*  --  233*  --   --   BUN  --  7  --  <5*  --   --   CREATININE  --  0.74 0.85 0.95 0.90 0.82  CALCIUM  --  8.2*  --  8.2*  --   --   MG 1.8  --   --   --   --   --    Liver Function Tests: No results for input(s): AST, ALT, ALKPHOS, BILITOT, PROT, ALBUMIN in the last 168 hours. No results for input(s): LIPASE, AMYLASE in the last 168 hours. No results for input(s): AMMONIA in the last 168 hours. CBC:  Recent Labs Lab 05/30/17 0425 06/01/17 0409 06/04/17 1415  WBC 7.9 7.2 10.0  HGB 11.4* 10.6* 10.6*  HCT 35.0* 32.5* 33.3*  MCV  88.6 91.0 91.0  PLT 279 301 273   Cardiac Enzymes: No results for input(s): CKTOTAL, CKMB, CKMBINDEX, TROPONINI in the last 168 hours. BNP: BNP (last 3 results)  Recent Labs  05/27/17 2029  BNP 132.4*    ProBNP (last 3 results) No results for input(s): PROBNP in the last 8760 hours.  CBG:  Recent Labs Lab 06/04/17 1200 06/04/17 1658 06/04/17 2111 06/05/17 0725 06/05/17 1232  GLUCAP 192* 350* 377* 188* 226*

## 2017-06-05 NOTE — Discharge Instructions (Signed)
Please read and follow all provided instructions.  Your diagnoses today include:  1. Cellulitis of left hip   2. Acute cystitis without hematuria     Tests performed today include:  Vital signs. See below for your results today.   Medications prescribed:   Take as prescribed   Home care instructions:  Follow any educational materials contained in this packet.  Follow-up instructions: Please follow-up with your primary care provider for further evaluation of symptoms and treatment   Return instructions:   Please return to the Emergency Department if you do not get better, if you get worse, or new symptoms OR  - Fever (temperature greater than 101.69F)  - Bleeding that does not stop with holding pressure to the area    -Severe pain (please note that you may be more sore the day after your accident)  - Chest Pain  - Difficulty breathing  - Severe nausea or vomiting  - Inability to tolerate food and liquids  - Passing out  - Skin becoming red around your wounds  - Change in mental status (confusion or lethargy)  - New numbness or weakness     Please return if you have any other emergent concerns.  Additional Information:  Your vital signs today were: BP (!) 135/59    Pulse 96    Temp 98.9 F (37.2 C) (Oral)    Resp 15    SpO2 97% Comment: RA If your blood pressure (BP) was elevated above 135/85 this visit, please have this repeated by your doctor within one month. ---------------

## 2017-06-06 ENCOUNTER — Non-Acute Institutional Stay (SKILLED_NURSING_FACILITY): Payer: Medicare Other | Admitting: Adult Health

## 2017-06-06 ENCOUNTER — Encounter: Payer: Self-pay | Admitting: Adult Health

## 2017-06-06 DIAGNOSIS — I5032 Chronic diastolic (congestive) heart failure: Secondary | ICD-10-CM

## 2017-06-06 DIAGNOSIS — IMO0002 Reserved for concepts with insufficient information to code with codable children: Secondary | ICD-10-CM

## 2017-06-06 DIAGNOSIS — L03116 Cellulitis of left lower limb: Secondary | ICD-10-CM | POA: Diagnosis not present

## 2017-06-06 DIAGNOSIS — N319 Neuromuscular dysfunction of bladder, unspecified: Secondary | ICD-10-CM

## 2017-06-06 DIAGNOSIS — L109 Pemphigus, unspecified: Secondary | ICD-10-CM | POA: Diagnosis not present

## 2017-06-06 DIAGNOSIS — I110 Hypertensive heart disease with heart failure: Secondary | ICD-10-CM | POA: Diagnosis not present

## 2017-06-06 DIAGNOSIS — E1169 Type 2 diabetes mellitus with other specified complication: Secondary | ICD-10-CM | POA: Diagnosis not present

## 2017-06-06 DIAGNOSIS — E1143 Type 2 diabetes mellitus with diabetic autonomic (poly)neuropathy: Secondary | ICD-10-CM

## 2017-06-06 DIAGNOSIS — T83511S Infection and inflammatory reaction due to indwelling urethral catheter, sequela: Secondary | ICD-10-CM | POA: Diagnosis not present

## 2017-06-06 DIAGNOSIS — E785 Hyperlipidemia, unspecified: Secondary | ICD-10-CM

## 2017-06-06 DIAGNOSIS — E1141 Type 2 diabetes mellitus with diabetic mononeuropathy: Secondary | ICD-10-CM

## 2017-06-06 DIAGNOSIS — E1165 Type 2 diabetes mellitus with hyperglycemia: Secondary | ICD-10-CM | POA: Diagnosis not present

## 2017-06-06 DIAGNOSIS — N390 Urinary tract infection, site not specified: Secondary | ICD-10-CM | POA: Diagnosis not present

## 2017-06-06 NOTE — Progress Notes (Signed)
Location:   [facility] Room Number: 216 B Place of Service:  SNF (31)   CODE STATUS: Full Code  Allergies  Allergen Reactions  . Ace Inhibitors Swelling    Pt tolerates lisinopril  . Lipitor [Atorvastatin Calcium] Swelling  . Metformin And Related Swelling  . Morphine And Related Other (See Comments)    Sweating, feels like is "in rocky boat."  . Robaxin [Methocarbamol] Other (See Comments)    Feels like he is shaky    Chief Complaint  Patient presents with  . Hospitalization Follow-up    Hospital follow up    HPI:  He is a long term resident of this facility who has been hospitalized from 05-27-17 through 06-05-17 for sepsis due to cellulitis on the left thigh and an uti with esbl e-coli.  He was treated initially with zosyn; vancomycin and ancef and has been transitioned to doxycyline for 10 days.  He has completed his uti abt. His cellulitis was slow to resolve and does remain slightly red. Due to the slow improvement ID was consulted and was told due to obesity this can be expected.  He denies pain; states his foley feels better; and denies any changes in his appetite. He did tell me that he is happy to be back home.  There are no nursing concerns at this time.    Past Medical History:  Diagnosis Date  . Arthritis    "hands, ankles, knees" (11/10/2014)  . Bladder spasms   . CAD (coronary artery disease)   . CAD in native artery 09/15/2015  . CHF (congestive heart failure) ([place])   . Chronic indwelling Foley catheter   . Chronic lower back pain   . Depression   . Diabetic diarrhea ([place])   . Diabetic neuropathy, painful ([place])   . DVT (deep venous thrombosis) ([facility]) 1980's   LLE  . GERD (gastroesophageal reflux disease)   . Hyperlipidemia   . Hypertension   . Neurogenic bladder   . Obese   . OSA (obstructive sleep apnea)    "they wanted me to wear a mask; I couldn't" (11/10/2014)  . Pneumonia 1999  . PONV (postoperative nausea and vomiting)   . Stroke [facility])  10/2011; 06/2014   right hand numbness/notes 10/20/2011, pt not sure he had stroke in 2013; "right hand weaker and right face not quite right since" (11/10/2014)  . Type II diabetes mellitus ([place])   . Uncontrolled type II diabetes with peripheral autonomic neuropathy ([place]) 05/09/2015    Past Surgical History:  Procedure Laterality Date  . CARDIAC CATHETERIZATION N/A 05/02/2015   Procedure: Left Heart Cath and Coronary Angiography;  Surgeon: Lorretta Harp, MD;  Location: [facility] CV LAB;  Service: Cardiovascular;  Laterality: N/A;  . CARDIAC CATHETERIZATION N/A 05/04/2015   Procedure: Left Heart Cath and Coronary Angiography;  Surgeon: Wellington Hampshire, MD;  Location: [facility] CV LAB;  Service: Cardiovascular;  Laterality: N/A;  . CARPAL TUNNEL RELEASE Bilateral ~ 2003-2004  . CHOLECYSTECTOMY OPEN  1970's?  Mar[facility] Kitchen GASTROPLASTY    . JOINT REPLACEMENT    . LEFT HEART CATHETERIZATION WITH CORONARY ANGIOGRAM N/A 10/24/2011   Procedure: LEFT HEART CATHETERIZATION WITH CORONARY ANGIOGRAM;  Surgeon: Candee Furbish, MD;  Location: [facility] CATH LAB;  Service: Cardiovascular;  Laterality: N/A;  right radial artery approach  . TOTAL KNEE ARTHROPLASTY Right 1990's    Social History   Social History  . Marital status: Widowed    Spouse name: N/A  . Number of children: N/A  .  Years of education: N/A   Occupational History  . Retired    Social History Main Topics  . Smoking status: Former Smoker    Packs/day: 2.00    Years: 20.00    Types: Cigarettes    Quit date: 09/10/1976  . Smokeless tobacco: Never Used  . Alcohol use No     Comment: FORMER ALCOHOLIC; "sober since 34/74/2595"  . Drug use: No  . Sexual activity: No   Other Topics Concern  . Not on file   Social History Narrative   Lives alone.  No close family nearby.     Family History  Problem Relation Age of Onset  . Diabetes type I Father   . Hypertension Mother   . Cancer Brother         Testicular Cancer  . Cancer Brother         Leukemia      VITAL SIGNS BP (!) 122/58   Pulse 72   Temp 98.7 F (37.1 C)   Resp 18   Ht 6\' 2"  (1.88 m)   Wt (!) 370 lb 6 oz (168 kg)   SpO2 97%   BMI 47.55 kg/m   Patient's Medications  New Prescriptions   No medications on file  Previous Medications   ACETAMINOPHEN (TYLENOL) 325 MG TABLET    Take 2 tablets (650 mg total) by mouth every 6 (six) hours as needed for mild pain (or Fever >/= 101).   ALUMINUM-MAGNESIUM HYDROXIDE-SIMETHICONE (MAALOX) 638-756-43 MG/5ML SUSP    Take 30 mLs by mouth every 6 (six) hours as needed.   AMLODIPINE (NORVASC) 5 MG TABLET    Take 5 mg by mouth daily.   ASPIRIN EC 81 MG TABLET    Take 81 mg by mouth daily. 0900   CLOTRIMAZOLE 1 % OINT    Apply to trunk topically every day and night shift for itching   DOXYCYCLINE (VIBRA-TABS) 100 MG TABLET    Take 1 tablet (100 mg total) by mouth 2 (two) times daily.   ESCITALOPRAM (LEXAPRO) 10 MG TABLET    Take 10 mg by mouth daily.    EZETIMIBE (ZETIA) 10 MG TABLET    Take 10 mg by mouth daily.   FOLIC ACID (FOLVITE) 1 MG TABLET    Take 1 mg by mouth daily.   GABAPENTIN (NEURONTIN) 100 MG CAPSULE    Take 1 capsule (100 mg total) by mouth 2 (two) times daily.   GUAIFENESIN (MUCINEX) 600 MG 12 HR TABLET    Take 600 mg by mouth 2 (two) times daily.   HYDROXYZINE (ATARAX/VISTARIL) 25 MG TABLET    Take 25 mg by mouth 3 (three) times daily.    INSULIN GLARGINE (TOUJEO SOLOSTAR) 300 UNIT/ML SOPN    Inject 40 Units into the skin at bedtime.    INSULIN LISPRO (HUMALOG) 100 UNIT/ML INJECTION    Inject 0.1 mLs (10 Units total) into the skin 3 (three) times daily before meals.   IPRATROPIUM-ALBUTEROL (DUONEB) 0.5-2.5 (3) MG/3ML SOLN    Take 3 mLs by nebulization every 6 (six) hours as needed (sob).    METHOTREXATE (RHEUMATREX) 10 MG TABLET    Take 10 mg by mouth every Wednesday. Caution: Chemotherapy. Protect from light.   METOPROLOL TARTRATE (LOPRESSOR) 25 MG TABLET    Take 12.5 mg by mouth 2 (two) times daily.     MULTIPLE VITAMINS-MINERALS (DECUBI-VITE) CAPS    Take 1 capsule by mouth daily at 3 pm.   NITROGLYCERIN (NITROSTAT) 0.4 MG SL TABLET  Place 0.4 mg under the tongue every 5 (five) minutes as needed for chest pain.   NYSTATIN POWD    Apply 1 application transdermally two times a day for rash.  Apply to reddened areas under right side folds   OMEPRAZOLE (PRILOSEC) 20 MG CAPSULE    Take 20 mg by mouth daily.   OXYGEN    Inhale into the lungs. O2 at 2l/min via nasal canula as needed for shortness of breath   POTASSIUM CHLORIDE (KLOR-CON) 20 MEQ PACKET    Take 40 mEq by mouth daily. 0800   PREDNISONE (DELTASONE) 10 MG TABLET    Take 10 mg by mouth daily.   ROPINIROLE (REQUIP) 0.5 MG TABLET    Take 0.5 mg by mouth at bedtime. 2100   SENNOSIDES-DOCUSATE SODIUM (SENOKOT-S) 8.6-50 MG TABLET    Take 2 tablets by mouth at bedtime. 2100   TORSEMIDE (DEMADEX) 20 MG TABLET    Take 20 mg by mouth 2 (two) times daily.   TRAZODONE (DESYREL) 50 MG TABLET    Take 50 mg by mouth at bedtime.   Modified Medications   No medications on file  Discontinued Medications   INSULIN GLARGINE (LANTUS) 100 UNIT/ML INJECTION    Inject 0.33 mLs (33 Units total) into the skin at bedtime.     SIGNIFICANT DIAGNOSTIC EXAMS  PREVIOUS  10-18-15: EEG: This is a normal EEG for the patients stated age.  There were no focal, hemispheric or lateralizing features.  No epileptiform activity was recorded.  A normal EEG does not exclude the diagnosis of a seizure disorder and if seizure remains high on the list of differential diagnosis, an ambulatory EEG may be of value.  Clinical correlation is required.  01-11-16: ct of head: No acute intracranial abnormality. Stable mild cerebral atrophy and chronic white matter disease. No definite acute cortical infarction.  01-11-16: scrotal ultrasound: 1. Left epididymo-orchitis. 2. Microlithiasis involving the left testicle. 3. Three adjacent complex cysts/spermatoceles involving the left  epididymal head.  TODAY:  05-27-17: ct of abdomen and pelvis  1. Negative for hydronephrosis. Nonspecific perinephric fat stranding. Foley catheter is present in the bladder which is empty, the tip of the catheter appears to tent the superior wall of the bladder. 2. There are no acute intra-abdominal or pelvic abnormalities seen. 3. 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 Bilateral hip joint degenerative changes, right greater than left but no hip fracture, AVN or findings to suggest septic arthritis. 2. Significant artifact from the patient's skin touching the gantry. Suspect mild cellulitis but no obvious abscess, myofasciitis or Pyomyositis.  05-31-17: 2-d echo:   Left ventricle: The cavity size was normal. Wall thickness was increased in a pattern of mild LVH. Systolic function was normal. The estimated ejection fraction was in the range of 55% to 60%. Doppler parameters are consistent with abnormal left ventricular relaxation (grade 1 diastolic dysfunction). - Aortic valve: There was mild stenosis. There was trivial regurgitation.  - Left atrium: The atrium was moderately dilated. - Atrial septum: No defect or patent foramen ovale was identified.  LABS REVIEWED: PREVIOUS     05-27-16: hgb a1c 11.7 07-20-16: stool for c-diff: +  07-30-16: wbc 15.1; hgb 12.4; hct 37.7; mcv 93.5; plt 289; glucose 322; bun 28.3; creat 0.91; k+ 4.3; na++ 137; liver normal albumin 3.4  12-13-16: urine micro-albumin 54.44 01-29-17: wbc 15.0; hgb 12.6; hct 38.1; mcv 93.0; plt 255; glucose 418; bun 29.1 creat 1.03; k+ 4.1; n++ 136;  ca 8.6; liver normal albumin 3.3; hgb a1c 9.1  05-08-17: hgb a1c 9.8; chol 190; ldl 92; trig 299; hdl 38   TODAY:   05-27-17: wbc 17.5; hgb 11.8; hct 35.8; mcv 90.2; plt 271; glucose 222; bun 16; creat 0.95; k+ 3.6; na++ 135; ca 8.4; CRP 17.1; sed rate 48; blood culture: no growth; urine culture: e-coli: esbl 05-28-17: wbc 13.3; hgb 11.5; hct 34.9; mcv  88.4; plt 252; glucose 208; bun 14; creat 0.87; k+ 3.2; na++ 135; ca 8.1 AM cortisol 9.5 05-30-17: wbc 7.9; hgb 11.4; hct 35.0; mcv 88.6; plt 279; glucose 175; bun 7; creat 0.74; k+ 3.5; na++ 134; ca 8.2  06-02-17: folate 19.0; vit B12: 357; ferritin 123; iron 45; TIBC 227 06-04-17: wbc 10.0; hgb 10.6; hct 33.3; mcv 91.0; plt 273   Review of Systems  Constitutional: Negative for malaise/fatigue.  Respiratory: Negative for cough and shortness of breath.   Cardiovascular: Negative for chest pain, palpitations and leg swelling.  Gastrointestinal: Negative for abdominal pain, constipation and heartburn.  Musculoskeletal: Negative for back pain, joint pain and myalgias.  Skin: Negative.        Left hip is improving and feels better   Neurological: Negative for dizziness.  Psychiatric/Behavioral: The patient is not nervous/anxious.    Physical Exam  Constitutional: He is oriented to person, place, and time. No distress.  Obese   Eyes: Conjunctivae are normal.  Neck: Normal range of motion. Neck supple. No thyromegaly present.  Cardiovascular: Normal rate, regular rhythm, normal heart sounds and intact distal pulses.   Pulmonary/Chest: Effort normal and breath sounds normal. No respiratory distress.  Abdominal: Soft. Bowel sounds are normal. He exhibits no distension. There is no tenderness.  Genitourinary:  Genitourinary Comments: Has penile tear Has foley   Musculoskeletal:  Is able to move all extremities   Lymphadenopathy:    He has no cervical adenopathy.  Neurological: He is alert and oriented to person, place, and time.  Skin: Skin is warm and dry. He is not diaphoretic.  Lower extremities discolored No rash Left outer thigh is pink with induration present; has improved     ASSESSMENT/ PLAN:  TODAY:   1. Hypertension: is stable; b/p 120/80: will continue norvasc 5 mg daily lopressor 12.5 mg twice daily   2. Chronic diastolic heart failure: stable; will continue demadex 20 mg  twice daily with k+ 40 meq daily  asa 81 mg daily lopressor 12.5 mg twice daily and has ntg 0.4 mg every 5 mins X 3 as needed  3.  GERD: stable will continue prilosec 20 mg daily   4. Diabetic neuropathy: stable hgb a1c 9.8: will continue neurontin 100 mg twice daily   5. Dyslipidemia: stable  ldl 92; will continue zetia 10 mg daily   6. Diabetes: without change hgb a1c 9.8: will continue tuojeo 40 units nightly and 10 units with meals  7.  Allergic rhinitis: stable will continue mucinex 600 mg twice daily and duoneb every 6 hours as needed   8. Bullous pemphigoid: stable will continue methotrexte 10 mg weekly with folic acid 1 mg daily and is on long term prednisone 10 mg daily   9. Restless leg syndrome: is stable will continue requip 0.5 mg nightly   10. Depression: is stable will continue lexapro 10 mg daily and takes trazodone 50 mg nightly   11. Constipation: stable will continue senna s 2 tabs daily   12. Pruritis: stable will continue atarax 25 mg three times daily   13.  Cellulitis left thigh; uti; sepsis: is stable  will continue doxycycline 100 mg twice daily for 10 more days; will monitor     MD is aware of resident's narcotic use and is in agreement with current plan of care. We will attempt to wean resident as apropriate   Ok Edwards NP [facility]  Contact [phone] Monday through Friday 8am- 5pm  After hours call [phone]

## 2017-06-07 ENCOUNTER — Encounter: Payer: Self-pay | Admitting: Adult Health

## 2017-06-07 ENCOUNTER — Telehealth: Payer: Self-pay

## 2017-06-07 ENCOUNTER — Non-Acute Institutional Stay (SKILLED_NURSING_FACILITY): Payer: Medicare Other | Admitting: Adult Health

## 2017-06-07 DIAGNOSIS — L03116 Cellulitis of left lower limb: Secondary | ICD-10-CM

## 2017-06-07 NOTE — Telephone Encounter (Signed)
Possible re-admission to facility. This is a patient you were seeing at [facility]. [facility] F/U is needed if patient was re-admitted to facility upon discharge. Hospital discharge from [facility] on 06/05/17

## 2017-06-07 NOTE — Progress Notes (Signed)
Location:   [address] Room Number: 216 B Place of Service:  SNF (31)   CODE STATUS: Full Code  Allergies  Allergen Reactions  . Ace Inhibitors Swelling    Pt tolerates lisinopril  . Lipitor [Atorvastatin Calcium] Swelling  . Metformin And Related Swelling  . Morphine And Related Other (See Comments)    Sweating, feels like is "in rocky boat."  . Robaxin [Methocarbamol] Other (See Comments)    Feels like he is shaky    Chief Complaint  Patient presents with  . Acute visit     change in status    HPI:  Nursing staff report today that he is not feeling good. He is unable to tell them exactly what he is feeling. He tells me that he has nausea; generally poor; unable to explain; there are no reports of fever present.    Past Medical History:  Diagnosis Date  . Arthritis    "hands, ankles, knees" (11/10/2014)  . Bladder spasms   . CAD (coronary artery disease)   . CAD in native artery 09/15/2015  . CHF (congestive heart failure) ([place])   . Chronic indwelling Foley catheter   . Chronic lower back pain   . Depression   . Diabetic diarrhea ([place])   . Diabetic neuropathy, painful ([place])   . DVT (deep venous thrombosis) ([facility]) 1980's   LLE  . GERD (gastroesophageal reflux disease)   . Hyperlipidemia   . Hypertension   . Neurogenic bladder   . Obese   . OSA (obstructive sleep apnea)    "they wanted me to wear a mask; I couldn't" (11/10/2014)  . Pneumonia 1999  . PONV (postoperative nausea and vomiting)   . Stroke [facility]) 10/2011; 06/2014   right hand numbness/notes 10/20/2011, pt not sure he had stroke in 2013; "right hand weaker and right face not quite right since" (11/10/2014)  . Type II diabetes mellitus ([place])   . Uncontrolled type II diabetes with peripheral autonomic neuropathy ([place]) 05/09/2015    Past Surgical History:  Procedure Laterality Date  . CARDIAC CATHETERIZATION N/A 05/02/2015   Procedure: Left Heart Cath and Coronary Angiography;  Surgeon: Lorretta Harp, MD;  Location: [place] [facility];  Service: Cardiovascular;  Laterality: N/A;  . CARDIAC CATHETERIZATION N/A 05/04/2015   Procedure: Left Heart Cath and Coronary Angiography;  Surgeon: Wellington Hampshire, MD;  Location: [place] [facility];  Service: Cardiovascular;  Laterality: N/A;  . CARPAL TUNNEL RELEASE Bilateral ~ 2003-2004  . CHOLECYSTECTOMY OPEN  1970's?  [facility] GASTROPLASTY    . JOINT REPLACEMENT    . LEFT HEART CATHETERIZATION WITH CORONARY ANGIOGRAM N/A 10/24/2011   Procedure: LEFT HEART CATHETERIZATION WITH CORONARY ANGIOGRAM;  Surgeon: Candee Furbish, MD;  Location: [facility] CATH LAB;  Service: Cardiovascular;  Laterality: N/A;  right radial artery approach  . TOTAL KNEE ARTHROPLASTY Right 1990's    Social History   Social History  . Marital status: Widowed    Spouse name: N/A  . Number of children: N/A  . Years of education: N/A   Occupational History  . Retired    Social History Main Topics  . Smoking status: Former Smoker    Packs/day: 2.00    Years: 20.00    Types: Cigarettes    Quit date: 09/10/1976  . Smokeless tobacco: Never Used  . Alcohol use No     Comment: FORMER ALCOHOLIC; "sober since 40/34/7425"  . Drug use: No  . Sexual activity: No   Other Topics Concern  .  Not on file   Social History Narrative   Lives alone.  No close family nearby.     Family History  Problem Relation Age of Onset  . Diabetes type I Father   . Hypertension Mother   . Cancer Brother         Testicular Cancer  . Cancer Brother        Leukemia      VITAL SIGNS BP 120/80   Pulse 78   Temp 98.9 F (37.2 C)   Resp 18   Ht 6\' 1"  (1.854 m)   Wt (!) 348 lb 3.2 oz (157.9 kg)   SpO2 98%   BMI 45.94 kg/m   Patient's Medications  New Prescriptions   No medications on file  Previous Medications   ACETAMINOPHEN (TYLENOL) 325 MG TABLET    Take 2 tablets (650 mg total) by mouth every 6 (six) hours as needed for mild pain (or Fever >/= 101).   ALUMINUM-MAGNESIUM  HYDROXIDE-SIMETHICONE (MAALOX) 409-811-91 MG/5ML SUSP    Take 30 mLs by mouth every 6 (six) hours as needed.   AMLODIPINE (NORVASC) 5 MG TABLET    Take 5 mg by mouth daily.   ASPIRIN EC 81 MG TABLET    Take 81 mg by mouth daily. 0900   CLOTRIMAZOLE 1 % OINT    Apply to trunk topically every day and night shift for itching   DOXYCYCLINE (VIBRA-TABS) 100 MG TABLET    Take 1 tablet (100 mg total) by mouth 2 (two) times daily.   ESCITALOPRAM (LEXAPRO) 10 MG TABLET    Take 10 mg by mouth daily.    EZETIMIBE (ZETIA) 10 MG TABLET    Take 10 mg by mouth daily.   FOLIC ACID (FOLVITE) 1 MG TABLET    Take 1 mg by mouth daily.   GABAPENTIN (NEURONTIN) 100 MG CAPSULE    Take 1 capsule (100 mg total) by mouth 2 (two) times daily.   GUAIFENESIN (MUCINEX) 600 MG 12 HR TABLET    Take 600 mg by mouth 2 (two) times daily.   HYDROXYZINE (ATARAX/VISTARIL) 25 MG TABLET    Take 25 mg by mouth 3 (three) times daily.    INSULIN GLARGINE (TOUJEO SOLOSTAR) 300 UNIT/ML SOPN    Inject 40 Units into the skin at bedtime.    INSULIN LISPRO (HUMALOG) 100 UNIT/ML INJECTION    Inject as per sliding scale subcutaneously with meals.  Pt may self administer 0-70 = 0 units 71-450 = 10 units 451-600 Notify MD   IPRATROPIUM-ALBUTEROL (DUONEB) 0.5-2.5 (3) MG/3ML SOLN    Take 3 mLs by nebulization every 6 (six) hours as needed (sob).    METHOTREXATE (RHEUMATREX) 10 MG TABLET    Take 10 mg by mouth every Wednesday. Caution: Chemotherapy. Protect from light.   METOPROLOL TARTRATE (LOPRESSOR) 25 MG TABLET    Take 12.5 mg by mouth 2 (two) times daily.    MULTIPLE VITAMINS-MINERALS (DECUBI-VITE) CAPS    Take 1 capsule by mouth daily at 3 pm.   NITROGLYCERIN (NITROSTAT) 0.4 MG SL TABLET    Place 0.4 mg under the tongue every 5 (five) minutes as needed for chest pain.   NYSTATIN POWD    Apply 1 application transdermally two times a day for rash.  Apply to reddened areas under right side folds   OMEPRAZOLE (PRILOSEC) 20 MG CAPSULE    Take 20 mg  by mouth daily.   OXYGEN    Inhale into the lungs. O2 at 2l/min via nasal  canula as needed for shortness of breath   POTASSIUM CHLORIDE (KLOR-CON) 20 MEQ PACKET    Take 40 mEq by mouth daily. 0800   PREDNISONE (DELTASONE) 10 MG TABLET    Take 10 mg by mouth daily.   ROPINIROLE (REQUIP) 0.5 MG TABLET    Take 0.5 mg by mouth at bedtime. 2100   SENNOSIDES-DOCUSATE SODIUM (SENOKOT-S) 8.6-50 MG TABLET    Take 2 tablets by mouth at bedtime. 2100   TORSEMIDE (DEMADEX) 20 MG TABLET    Take 20 mg by mouth 2 (two) times daily.   TRAZODONE (DESYREL) 50 MG TABLET    Take 50 mg by mouth at bedtime.   Modified Medications   No medications on file  Discontinued Medications   INSULIN LISPRO (HUMALOG) 100 UNIT/ML INJECTION    Inject 0.1 mLs (10 Units total) into the skin 3 (three) times daily before meals.     SIGNIFICANT DIAGNOSTIC EXAMS  PREVIOUS  10-18-15: EEG: This is a normal EEG for the patients stated age.  There were no focal, hemispheric or lateralizing features.  No epileptiform activity was recorded.  A normal EEG does not exclude the diagnosis of a seizure disorder and if seizure remains high on the list of differential diagnosis, an ambulatory EEG may be of value.  Clinical correlation is required.  01-11-16: ct of head: No acute intracranial abnormality. Stable mild cerebral atrophy and chronic white matter disease. No definite acute cortical infarction.  01-11-16: scrotal ultrasound: 1. Left epididymo-orchitis. 2. Microlithiasis involving the left testicle. 3. Three adjacent complex cysts/spermatoceles involving the left epididymal head.  05-27-17: ct of abdomen and pelvis  1. Negative for hydronephrosis. Nonspecific perinephric fat stranding. Foley catheter is present in the bladder which is empty, the tip of the catheter appears to tent the superior wall of the bladder. 2. There are no acute intra-abdominal or pelvic abnormalities seen. 3. 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 Bilateral hip joint degenerative changes, right greater than left but no hip fracture, AVN or findings to suggest septic arthritis. 2. Significant artifact from the patient's skin touching the gantry. Suspect mild cellulitis but no obvious abscess, myofasciitis or Pyomyositis.  05-31-17: 2-d echo:   Left ventricle: The cavity size was normal. Wall thickness was increased in a pattern of mild LVH. Systolic function was normal. The estimated ejection fraction was in the range of 55% to 60%. Doppler parameters are consistent with abnormal left ventricular relaxation (grade 1 diastolic dysfunction). - Aortic valve: There was mild stenosis. There was trivial regurgitation.  - Left atrium: The atrium was moderately dilated. - Atrial septum: No defect or patent foramen ovale was identified  NO NEW EXAMS .  LABS REVIEWED: PREVIOUS     05-27-16: hgb a1c 11.7 07-20-16: stool for c-diff: +  07-30-16: wbc 15.1; hgb 12.4; hct 37.7; mcv 93.5; plt 289; glucose 322; bun 28.3; creat 0.91; k+ 4.3; na++ 137; liver normal albumin 3.4  12-13-16: urine micro-albumin 54.44 01-29-17: wbc 15.0; hgb 12.6; hct 38.1; mcv 93.0; plt 255; glucose 418; bun 29.1 creat 1.03; k+ 4.1; n++ 136; ca 8.6; liver normal albumin 3.3; hgb a1c 9.1  05-08-17: hgb a1c 9.8; chol 190; ldl 92; trig 299; hdl 38  05-27-17: wbc 17.5; hgb 11.8; hct 35.8; mcv 90.2; plt 271; glucose 222; bun 16; creat 0.95; k+ 3.6; na++ 135; ca 8.4; CRP 17.1; sed rate 48; blood culture: no growth; urine culture: e-coli: esbl 05-28-17: wbc 13.3; hgb 11.5; hct  34.9; mcv 88.4; plt 252; glucose 208; bun 14; creat 0.87; k+ 3.2; na++ 135; ca 8.1 AM cortisol 9.5 05-30-17: wbc 7.9; hgb 11.4; hct 35.0; mcv 88.6; plt 279; glucose 175; bun 7; creat 0.74; k+ 3.5; na++ 134; ca 8.2  06-02-17: folate 19.0; vit B12: 357; ferritin 123; iron 45; TIBC 227 06-04-17: wbc 10.0; hgb 10.6; hct 33.3; mcv 91.0; plt 273  NO NEW LABS    Review of Systems  Constitutional:  Positive for malaise/fatigue.  Respiratory: Negative for cough and shortness of breath.   Cardiovascular: Negative for chest pain, palpitations and leg swelling.  Gastrointestinal: Positive for nausea. Negative for abdominal pain, constipation, heartburn and vomiting.  Musculoskeletal: Negative for back pain, joint pain and myalgias.  Skin: Negative.        Left thigh is hurting   Neurological: Negative for dizziness.  Psychiatric/Behavioral: The patient is not nervous/anxious.    Physical Exam  Constitutional: He is oriented to person, place, and time. He appears well-developed and well-nourished. No distress.  Morbid obesity   Neck: Neck supple. No thyromegaly present.  Cardiovascular: Normal rate, regular rhythm, normal heart sounds and intact distal pulses.   Pulmonary/Chest: Effort normal and breath sounds normal. No respiratory distress.  Abdominal: Soft. Bowel sounds are normal. He exhibits no distension. There is no tenderness.  Genitourinary:  Genitourinary Comments: Has foley  Has penile tear   Musculoskeletal: He exhibits no edema.  Is abe to move all extremities   Lymphadenopathy:    He has no cervical adenopathy.  Neurological: He is alert and oriented to person, place, and time.  Skin: Skin is warm and dry. He is not diaphoretic.  Lower extremities discolored No rash Left outer thigh is more inflamed doe shave induration present   Psychiatric: He has a normal mood and affect.   ASSESSMENT/ PLAN:  TODAY:   1.  Cellulitis left thigh; is worse: will insert picc line; will begin vancomycin 750 mg every 8 our for 2 weeks and will monitor  will continue doxycycline 100 mg twice daily for 10 more days; will monitor    MD is aware of resident's narcotic use and is in agreement with current plan of care. We will attempt to wean resident as apropriate     Ok Edwards NP [name]  Contact [phone] Monday through Friday 8am- 5pm  After hours call  [phone]

## 2017-06-10 ENCOUNTER — Other Ambulatory Visit: Payer: Self-pay

## 2017-06-10 ENCOUNTER — Non-Acute Institutional Stay (SKILLED_NURSING_FACILITY): Payer: Medicare Other | Admitting: Internal Medicine

## 2017-06-10 ENCOUNTER — Encounter: Payer: Self-pay | Admitting: Internal Medicine

## 2017-06-10 DIAGNOSIS — E1143 Type 2 diabetes mellitus with diabetic autonomic (poly)neuropathy: Secondary | ICD-10-CM | POA: Diagnosis not present

## 2017-06-10 DIAGNOSIS — L109 Pemphigus, unspecified: Secondary | ICD-10-CM | POA: Diagnosis not present

## 2017-06-10 DIAGNOSIS — I5032 Chronic diastolic (congestive) heart failure: Secondary | ICD-10-CM

## 2017-06-10 DIAGNOSIS — E1165 Type 2 diabetes mellitus with hyperglycemia: Secondary | ICD-10-CM | POA: Diagnosis not present

## 2017-06-10 DIAGNOSIS — L03116 Cellulitis of left lower limb: Secondary | ICD-10-CM | POA: Diagnosis not present

## 2017-06-10 DIAGNOSIS — N390 Urinary tract infection, site not specified: Secondary | ICD-10-CM | POA: Diagnosis not present

## 2017-06-10 DIAGNOSIS — T83511S Infection and inflammatory reaction due to indwelling urethral catheter, sequela: Secondary | ICD-10-CM

## 2017-06-10 DIAGNOSIS — D649 Anemia, unspecified: Secondary | ICD-10-CM | POA: Diagnosis not present

## 2017-06-10 DIAGNOSIS — IMO0002 Reserved for concepts with insufficient information to code with codable children: Secondary | ICD-10-CM

## 2017-06-10 MED ORDER — TRAMADOL HCL 50 MG PO TABS: ORAL_TABLET | ORAL | 0 refills | Status: DC

## 2017-06-10 MED ORDER — HYDROCODONE-ACETAMINOPHEN 5-325 MG PO TABS: 1.0000 | ORAL_TABLET | ORAL | 0 refills | Status: DC | PRN

## 2017-06-10 NOTE — Telephone Encounter (Signed)
RX faxed to [facility] @ [phone], phone number [phone] 

## 2017-06-10 NOTE — Progress Notes (Signed)
Patient ID: [name], male   DOB: 06/27/1946, 71 y.o.   MRN: [id number]     HISTORY AND PHYSICAL   DATE:   June 10, 2017  Location:   [place] Room Number: [place] [place] of Service: SNF (31)   Extended Emergency Contact Information Primary Emergency Contact: Whiting,[name] Address: [address]          [place], [place] [address] [name] of [place] Mobile Phone: [phone] Relation: Friend Secondary Emergency Contact: [place] of [place] Phone: [phone] Mobile Phone: [phone] Relation: Brother  Advanced Directive information Does Patient Have a Medical Advance Directive?: Yes, Type of Advance Directive: Out of facility DNR (pink MOST or yellow form), Pre-existing out of facility DNR order (yellow form or pink MOST form): Pink MOST form placed in chart (order not valid for inpatient use), Does patient want to make changes to medical advance directive?: No - Patient declined  Chief Complaint  Patient presents with  . Readmit To SNF    Readmission    HPI:  71 yo male long term resident seen today as a readmission into SNF following hospital stay for sepsis, UTI 2/2 indwelling catheter, left hip cellulitis, syncope, NSVT, abdominal pain, DM.  He presented to the ED from SNF with fever, left hip redness and pain, dysuria, urinary retention. He was tx with IV vacno-->IV ancef. He did receive 1 dose of IV zosyn and rocephin. Blood cx NGTD. Urine cx (+) E coli >100k colonies. CT left hip was nef abscess but did show cellulitis. 2D echo revealed EF 55-60%, grade 1 DD. He had a 15 beat run of NSVT. He was d/c'd with doxy x 10 days and insulin resumed. WBC 17.5K-->10K; Hgb 11.8--->10.6; K dropped 3.2-->3.8; iron 45; ferritin 123; Vit B12 level 357, A1c 9.8%; Cr 0.90 at d/c. He presents to SNF for long term care.  Today he reports severe pain not controlled with Tramadol. CBGs 200-320s. No low BS reactions. He frequently  refuses insulin. Appetite ok and sleeps well. No falls. Other than med and personal care refusals, no nursing concerns. Cellulitis in left thigh was worse on 06/07/17 and he was started on IV vanco via PICC line.  Hypertension - BP stable on norvasc 5 mg daily; lopressor 12.5 mg twice daily. He takes ASA daily  Chronic diastolic heart failure - asymptomatic. Stable on demadex 20 mg twice daily with k+ 40 meq daily;  ASA 81 mg daily; Lopressor 12.5 mg twice daily; SLNTG 0.4 mg every 5 mins X 3 prn. EF 55-60% with grade 1 DD  GERD - stable on prilosec 20 mg daily   Dyslipidemia - stable on zetia 10 mg daily. LDL 92   DM - uncontrolled 2/2 medical noncompliance. A1c 9.8%. He takes  Tuojeo 40 units nightly and 10 units with meals. He has neuropathy and takes neurontin 100mg  BID. He takes zetia for hyperlipidemia. 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 Arthritis    "hands, ankles, knees" (11/10/2014)  . Bladder spasms   . CAD (coronary artery disease)   . CAD in native artery 09/15/2015  . CHF (congestive heart failure) ([name])   . Chronic indwelling Foley catheter   . Chronic lower back pain   . Depression   . Diabetic diarrhea ([place])   . Diabetic neuropathy, painful ([place])   . DVT (deep venous thrombosis) ([facility]) 1980's   LLE  . GERD (gastroesophageal reflux disease)   . Hyperlipidemia   . Hypertension   . Neurogenic bladder   . Obese   . OSA  (obstructive sleep apnea)    "they wanted me to wear a mask; I couldn't" (11/10/2014)  . Pneumonia 1999  . PONV (postoperative nausea and vomiting)   . Stroke [facility]) 10/2011; 06/2014   right hand numbness/notes 10/20/2011, pt not sure he had stroke in 2013; "right hand weaker and right face not quite right since" (11/10/2014)  . Type II diabetes mellitus ([place])   . Uncontrolled type II diabetes with peripheral autonomic neuropathy ([place]) 05/09/2015    Past Surgical History:  Procedure Laterality Date  . CARDIAC CATHETERIZATION N/A 05/02/2015   Procedure: Left Heart Cath and Coronary Angiography;  Surgeon: Lorretta Harp, MD;  Location: [place] CV LAB;  Service: Cardiovascular;  Laterality: N/A;  . CARDIAC CATHETERIZATION N/A 05/04/2015   Procedure: Left Heart Cath and Coronary Angiography;  Surgeon: Wellington Hampshire, MD;  Location: [place] CV LAB;  Service: Cardiovascular;  Laterality: N/A;  . CARPAL TUNNEL RELEASE Bilateral ~ 2003-2004  . CHOLECYSTECTOMY OPEN  1970's?  Marland Kitchen GASTROPLASTY    . JOINT REPLACEMENT    . LEFT HEART CATHETERIZATION WITH CORONARY ANGIOGRAM N/A 10/24/2011   Procedure: LEFT HEART CATHETERIZATION WITH CORONARY ANGIOGRAM;  Surgeon: Candee Furbish, MD;  Location: [facility] C[facility] LAB;  Service: Cardiovascular;  Laterality: N/A;  right radial artery approach  . TOTAL KNEE ARTHROPLASTY Right 1990's    Patient Care Team: Gildardo Cranker, DO as PCP - General (Internal Medicine) Nyoka Cowden Phylis Bougie, NP as Nurse Practitioner ([place]) Center, [place] ([place])  Social History   Social History  . Marital status: Widowed    Spouse name: N/A  . Number of children: N/A  . Years of education: N/A   Occupational History  . Retired    Social History Main Topics  . Smoking status: Former Smoker    Packs/day: 2.00    Years: 20.00    Types: Cigarettes    Quit date: 09/10/1976  . Smokeless tobacco: Never Used  . Alcohol use No     Comment: FORMER  ALCOHOLIC; "sober since 09/81/1914"  . Drug use: No  . Sexual activity: No   Other Topics Concern  . Not on file   Social History Narrative   Lives alone.  No close family nearby.       reports that he quit smoking about 40 years ago. His smoking use included Cigarettes. He has a 40.00 pack-year smoking history. He has never used smokeless tobacco. He reports that he does not drink alcohol or use drugs.  Family History  Problem Relation Age of Onset  . Diabetes type I Father   . Hypertension Mother   . Cancer Brother         Testicular Cancer  . Cancer Brother        Leukemia   Family  Status  Relation Status  . Father Deceased       DM/DVT  . Mother Deceased       old age  . Brother Alive  . Brother Alive  . MGM Deceased  . MGF Deceased  . PGM Deceased  . PGF Deceased  . Brother (Not Specified)  . Brother (Not Specified)    Immunization History  Administered Date(s) Administered  . Influenza Split 10/12/2011, 10/13/2011  . Influenza,inj,Quad PF,6+ Mos 05/06/2015  . Influenza-Unspecified 06/10/2014  . PPD Test 10/13/2015, 10/27/2015, 05/25/2016    Allergies  Allergen Reactions  . Ace Inhibitors Swelling    Pt tolerates lisinopril  . Lipitor [Atorvastatin Calcium] Swelling  . Metformin And Related Swelling  . Morphine And Related Other (See Comments)    Sweating, feels like is "in rocky boat."  . Robaxin [Methocarbamol] Other (See Comments)    Feels like he is shaky    Medications: Patient's Medications  New Prescriptions   No medications on file  Previous Medications   ACETAMINOPHEN (TYLENOL) 325 MG TABLET    Take 2 tablets (650 mg total) by mouth every 6 (six) hours as needed for mild pain (or Fever >/= 101).   ALUMINUM-MAGNESIUM HYDROXIDE-SIMETHICONE (MAALOX) 614-431-54 MG/5ML SUSP    Take 30 mLs by mouth every 6 (six) hours as needed.   AMLODIPINE (NORVASC) 5 MG TABLET    Take 5 mg by mouth daily.   ASPIRIN EC 81 MG TABLET    Take 81 mg by mouth  daily. 0900   CLOTRIMAZOLE 1 % OINT    Apply to trunk topically every day and night shift for itching   DOXYCYCLINE (VIBRA-TABS) 100 MG TABLET    Take 1 tablet (100 mg total) by mouth 2 (two) times daily.   ESCITALOPRAM (LEXAPRO) 10 MG TABLET    Take 10 mg by mouth daily.    EZETIMIBE (ZETIA) 10 MG TABLET    Take 10 mg by mouth daily.   FOLIC ACID (FOLVITE) 1 MG TABLET    Take 1 mg by mouth daily.   GABAPENTIN (NEURONTIN) 100 MG CAPSULE    Take 1 capsule (100 mg total) by mouth 2 (two) times daily.   GUAIFENESIN (MUCINEX) 600 MG 12 HR TABLET    Take 600 mg by mouth 2 (two) times daily.   HYDROXYZINE (ATARAX/VISTARIL) 25 MG TABLET    Take 25 mg by mouth 3 (three) times daily.    INSULIN GLARGINE (TOUJEO SOLOSTAR) 300 UNIT/ML SOPN    Inject 40 Units into the skin at bedtime.    INSULIN LISPRO (HUMALOG) 100 UNIT/ML INJECTION    Inject as per sliding scale subcutaneously with meals.  Pt may self administer 0-70 = 0 units 71-450 = 10 units 451-600 Notify MD   IPRATROPIUM-ALBUTEROL (DUONEB) 0.5-2.5 (3) MG/3ML SOLN    Take 3 mLs by nebulization every 6 (six) hours as needed (sob).    METHOTREXATE (RHEUMATREX) 10 MG TABLET    Take 10 mg by mouth every Wednesday. Caution: Chemotherapy. Protect from light.   METOPROLOL TARTRATE (LOPRESSOR) 25 MG TABLET    Take 12.5 mg by mouth 2 (two) times daily.    MULTIPLE VITAMINS-MINERALS (DECUBI-VITE) CAPS    Take 1 capsule by mouth daily at 3 pm.   NITROGLYCERIN (NITROSTAT) 0.4 MG SL TABLET    Place 0.4 mg under the tongue every 5 (five) minutes as needed for chest pain.   NYSTATIN POWD    Apply 1 application transdermally two times a day for rash.  Apply to reddened areas under right side folds   OMEPRAZOLE (PRILOSEC) 20 MG CAPSULE    Take 20 mg by mouth daily.   OXYGEN    Inhale into the lungs. O2 at 2l/min via nasal canula as needed for shortness of breath   POTASSIUM CHLORIDE (KLOR-CON) 20 MEQ PACKET    Take 40 mEq by mouth daily. 0800   PREDNISONE  (DELTASONE) 10 MG TABLET    Take 10 mg by mouth daily.   ROPINIROLE (REQUIP) 0.5 MG TABLET    Take 0.5 mg by mouth at bedtime. 2100   SENNOSIDES-DOCUSATE SODIUM (SENOKOT-S) 8.6-50 MG TABLET    Take 2 tablets by mouth at bedtime. 2100   TORSEMIDE (DEMADEX) 20 MG TABLET    Take 20 mg by mouth daily.    TRAMADOL (ULTRAM) 50 MG TABLET    Take by mouth every 6 (six) hours as needed.   TRAZODONE (DESYREL) 50 MG TABLET    Take 50 mg by mouth at bedtime.   Modified Medications   No medications on file  Discontinued Medications   No medications on file    Review of Systems  Musculoskeletal: Positive for arthralgias and gait problem.  Skin: Positive for rash.  Neurological: Positive for numbness.  Psychiatric/Behavioral: Positive for dysphoric mood.  All other systems reviewed and are negative.   Vitals:   06/10/17 1120  BP: 120/80  Pulse: 78  Resp: 18  Temp: 97.8 F (36.6 C)  SpO2: 98%  Weight: (!) 370 lb 6 oz (168 kg)  Height: 6\' 2"  (1.88 m)   Body mass index is 47.55 kg/m.  Physical Exam  Constitutional: He is oriented to person, place, and time. He appears well-developed and well-nourished.  Obese, lying in bed in NAD  HENT:  Mouth/Throat: Oropharynx is clear and moist.  MMM; no oral thrush  Eyes: Pupils are equal, round, and reactive to light. No scleral icterus.  Neck: Neck supple. Carotid bruit is present (b/l systolic from chest). No thyromegaly present.  Cardiovascular: Normal rate, regular rhythm and intact distal pulses.  Exam reveals no gallop and no friction rub.   Murmur (2/6 SEM-->carotid b/l) heard. Left arm PICC line intact with no redness or d/c at insertion site; LE edema b/l. No calf TTP  Pulmonary/Chest: Breath sounds normal. No respiratory distress. He has no wheezes. He has no rales. He exhibits no tenderness.  Abdominal: Soft. Normal appearance and bowel sounds are normal. He exhibits no distension, no abdominal bruit, no pulsatile midline mass and no mass.  There is no hepatomegaly. There is no tenderness. There is no rigidity, no rebound and no guarding. No hernia.  obese  Genitourinary:  Genitourinary Comments: Foley cath DTG  Musculoskeletal: He exhibits edema and tenderness.  Lymphadenopathy:    He has no cervical adenopathy.  Neurological: He is alert and oriented to person, place, and time.  Skin: Skin is warm and dry. Rash (left hip cellulitis improved with minimum TTP and redness and no d/c; bullous pemphigoid  present with multiple areas left media;l and b/l calf ulcerations) noted.  Psychiatric: He has a normal mood and affect. His behavior is normal. Thought content normal.     Labs reviewed: Admission on 05/27/2017, Discharged on 06/05/2017  No results displayed because visit has over 200 results.    Abstract on 05/14/2017  Component Date Value Ref Range Status  . Hemoglobin A1C 05/08/2017 9.8   Final  Abstract on 05/08/2017  Component Date Value Ref Range Status  . Triglycerides 05/07/2017  299* 40 - 160 Final  . Cholesterol 05/07/2017 190  0 - 200 Final  . HDL 05/07/2017 38  35 - 70 Final  . LDL Cholesterol 05/07/2017 92   Final  Nursing Home on 03/27/2017  Component Date Value Ref Range Status  . Microalb, Ur 03/28/2017 14.9   Final  Abstract on 03/11/2017  Component Date Value Ref Range Status  . Glucose 01/29/2017 418   Final  . BUN 01/29/2017 29* 4 - 21 Final  . Creatinine 01/29/2017 1.0  0.6 - 1.3 Final  . Potassium 01/29/2017 4.1  3.4 - 5.3 Final  . Sodium 01/29/2017 136* 137 - 147 Final  . ALT 01/29/2017 13  10 - 40 Final  . Hemoglobin A1C 01/29/2017 9.1   Final    Ct Hip Left W Contrast  Result Date: 05/28/2017 CLINICAL DATA:  Left hip pain, swelling and redness for several days. EXAM: CT OF THE LOWER LEFT EXTREMITY WITH CONTRAST TECHNIQUE: Multidetector CT imaging of the lower left extremity was performed according to the standard protocol following intravenous contrast administration. COMPARISON:   05/27/2017 CONTRAST:  168mL ISOVUE-300 IOPAMIDOL (ISOVUE-300) INJECTION 61% FINDINGS: Moderate left hip joint degenerative changes with joint space narrowing, osteophytic spurring and subchondral cystic change. No fracture or AVN is demonstrated. No obvious hip joint effusion or destructive bony changes to suggest septic arthritis. Severe right hip joint degenerative changes are noted. Borderline left inguinal lymph nodes are noted. No inguinal mass or hernia. The major vascular structures appear patent. No significant intrapelvic abnormalities are identified. Air in the bladder from Foley catheter. The visualized bony pelvis is intact. No obvious sacral or pubic rami fractures. Artifact is noted from the patient's skin touching the gantry. There is mild skin thickening but I do not see any significant subcutaneous abnormalities. No abscess. No findings for myofasciitis or pyomyositis. No evidence of osteomyelitis. IMPRESSION: 1. Bilateral hip joint degenerative changes, right greater than left but no hip fracture, AVN or findings to suggest septic arthritis. 2. Significant artifact from the patient's skin touching the gantry. Suspect mild cellulitis but no obvious abscess, myofasciitis or pyomyositis. Electronically Signed   By: Marijo Sanes M.D.   On: 05/28/2017 16:14   Ct Abdomen Pelvis W Contrast  Result Date: 05/27/2017 CLINICAL DATA:  Indwelling catheter fever in left hip redness and pain, urinary retention EXAM: CT ABDOMEN AND PELVIS WITH CONTRAST TECHNIQUE: Multidetector CT imaging of the abdomen and pelvis was performed using the standard protocol following bolus administration of intravenous contrast. CONTRAST:  173mL ISOVUE-300 IOPAMIDOL (ISOVUE-300) INJECTION 61% COMPARISON:  05/12/2015 FINDINGS: Lower chest: Lung bases demonstrate no acute consolidation or pleural effusion. Linear atelectasis or scar at the right base. Borderline enlarged heart. Aortic and mitral calcifications. Hepatobiliary:  Surgical clips at the gallbladder fossa. No focal hepatic abnormality or biliary dilatation Pancreas: Unremarkable. No pancreatic ductal dilatation or surrounding inflammatory changes. Spleen: Normal in size without focal abnormality. Adrenals/Urinary Tract: Right adrenal gland normal. 2.3 cm fat density mass in the left adrenal gland consistent with myelolipoma. Subcentimeter hypodensities in both kidneys too small to characterize. Negative for hydronephrosis. Slight decreased density of left kidney but excretion present on delayed images. Foley catheter is present with the balloon in the bladder base. Bladder is empty. The tip of the Foley catheter tents the superior wall of the bladder. Stomach/Bowel: Postsurgical changes of the stomach. Multiple surgical clips in the gastrosplenic region. No dilated small bowel. No colon wall thickening. Normal appendix. Vascular/Lymphatic: Aortic atherosclerosis. No enlarged abdominal or pelvic  lymph nodes. Reproductive: No masses Other: Negative for free air or free fluid. Musculoskeletal: Degenerative changes. Grade 1 anterolisthesis of L4 on L5. Chronic wedging deformities at T10, T11 and T12. Left hip is incompletely included in the field of view. IMPRESSION: 1. Negative for hydronephrosis. Nonspecific perinephric fat stranding. Foley catheter is present in the bladder which is empty, the tip of the catheter appears to tent the superior wall of the bladder. 2. There are no acute intra-abdominal or pelvic abnormalities seen. 3. 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   On: 05/27/2017 21:32     Assessment/Plan   ICD-10-CM   1. Cellulitis of left hip L03.116    improving; pain uncontrolled  2. Uncontrolled type II diabetes with peripheral autonomic neuropathy (HCC) E11.43    E11.65    he is noncompliant with medical recommendations  3. Urinary tract infection associated with indwelling urethral catheter, sequela  T83.511S    N39.0      4. Bullous pemphigus L10.9   5. Chronic diastolic CHF (congestive heart failure) (HCC) I50.32   6. Morbid obesity ([place]) E66.01     Add Norco 5/325 take 1 tab po q4hrs prn mod-sev pain x 7 days due to uncontrolled pain  Avoid consuming any cracker due to uncontrolled BS  Wound care as ordered  Cont current meds as ordered. Finish abx  PT/OT/ST as indicated  F/u with specialists as scheduled  GOAL: short term rehab then long term care. Communicated with pt and nursing.  Will follow  Abbrielle Batts S. Perlie Gold  [facility] [address] [place], [place] [address] [phone] Cell (Monday-Friday 8 AM - 5 PM) [phone] After 5 PM and follow prompts

## 2017-06-11 DIAGNOSIS — L89323 Pressure ulcer of left buttock, stage 3: Secondary | ICD-10-CM | POA: Diagnosis not present

## 2017-06-14 DIAGNOSIS — S46012S Strain of muscle(s) and tendon(s) of the rotator cuff of left shoulder, sequela: Secondary | ICD-10-CM | POA: Diagnosis not present

## 2017-06-18 ENCOUNTER — Encounter: Payer: Self-pay | Admitting: Adult Health

## 2017-06-18 DIAGNOSIS — S46012S Strain of muscle(s) and tendon(s) of the rotator cuff of left shoulder, sequela: Secondary | ICD-10-CM | POA: Diagnosis not present

## 2017-06-18 DIAGNOSIS — L89323 Pressure ulcer of left buttock, stage 3: Secondary | ICD-10-CM | POA: Diagnosis not present

## 2017-06-18 NOTE — Progress Notes (Signed)
Entered in error

## 2017-06-20 DIAGNOSIS — S46012S Strain of muscle(s) and tendon(s) of the rotator cuff of left shoulder, sequela: Secondary | ICD-10-CM | POA: Diagnosis not present

## 2017-06-24 DIAGNOSIS — S46012S Strain of muscle(s) and tendon(s) of the rotator cuff of left shoulder, sequela: Secondary | ICD-10-CM | POA: Diagnosis not present

## 2017-06-25 DIAGNOSIS — D649 Anemia, unspecified: Secondary | ICD-10-CM | POA: Insufficient documentation

## 2017-06-27 DIAGNOSIS — S46012S Strain of muscle(s) and tendon(s) of the rotator cuff of left shoulder, sequela: Secondary | ICD-10-CM | POA: Diagnosis not present

## 2017-07-01 DIAGNOSIS — S46012S Strain of muscle(s) and tendon(s) of the rotator cuff of left shoulder, sequela: Secondary | ICD-10-CM | POA: Diagnosis not present

## 2017-07-08 DIAGNOSIS — S46012S Strain of muscle(s) and tendon(s) of the rotator cuff of left shoulder, sequela: Secondary | ICD-10-CM | POA: Diagnosis not present

## 2017-07-09 ENCOUNTER — Non-Acute Institutional Stay (SKILLED_NURSING_FACILITY): Payer: Medicare Other | Admitting: Adult Health

## 2017-07-09 ENCOUNTER — Encounter: Payer: Self-pay | Admitting: Adult Health

## 2017-07-09 DIAGNOSIS — E1141 Type 2 diabetes mellitus with diabetic mononeuropathy: Secondary | ICD-10-CM | POA: Diagnosis not present

## 2017-07-09 DIAGNOSIS — I110 Hypertensive heart disease with heart failure: Secondary | ICD-10-CM | POA: Diagnosis not present

## 2017-07-09 DIAGNOSIS — I5032 Chronic diastolic (congestive) heart failure: Secondary | ICD-10-CM | POA: Diagnosis not present

## 2017-07-09 DIAGNOSIS — E876 Hypokalemia: Secondary | ICD-10-CM

## 2017-07-09 DIAGNOSIS — K219 Gastro-esophageal reflux disease without esophagitis: Secondary | ICD-10-CM

## 2017-07-09 NOTE — Progress Notes (Signed)
Location:   [facility] Room Number: 216 B Place of Service:  SNF (31)   CODE STATUS:   Full Code  Allergies  Allergen Reactions  . Ace Inhibitors Swelling    Pt tolerates lisinopril  . Lipitor [Atorvastatin Calcium] Swelling  . Metformin And Related Swelling  . Morphine And Related Other (See Comments)    Sweating, feels like is "in rocky boat."  . Robaxin [Methocarbamol] Other (See Comments)    Feels like he is shaky    Chief Complaint  Patient presents with  . Medical Management of Chronic Issues    Hypertension; chronic diastolic heart failure; gerd; diabetic neuropathy and hypokalemia    HPI:  He is a 71 year old long term resident of this facility being seen for the management of his chronic illnesses: chronic diastolic heart failure; hypertensive heart disease with chf; gerd; diabetic neuropathy; and hypokalemia. He does spend all of his time in bed per his choice. He denies any chest pain or shortness of breath. He denies any unusual high or low cbg readings. There are no nursing concerns at this time.    Past Medical History:  Diagnosis Date  . Arthritis    "hands, ankles, knees" (11/10/2014)  . Bladder spasms   . CAD (coronary artery disease)   . CAD in native artery 09/15/2015  . CHF (congestive heart failure) ([place])   . Chronic indwelling Foley catheter   . Chronic lower back pain   . Depression   . Diabetic diarrhea ([place])   . Diabetic neuropathy, painful ([place])   . DVT (deep venous thrombosis) ([facility]) 1980's   LLE  . GERD (gastroesophageal reflux disease)   . Hyperlipidemia   . Hypertension   . Neurogenic bladder   . Obese   . OSA (obstructive sleep apnea)    "they wanted me to wear a mask; I couldn't" (11/10/2014)  . Pneumonia 1999  . PONV (postoperative nausea and vomiting)   . Stroke [facility]) 10/2011; 06/2014   right hand numbness/notes 10/20/2011, pt not sure he had stroke in 2013; "right hand weaker and right face not quite right since" (11/10/2014)    . Type II diabetes mellitus ([place])   . Uncontrolled type II diabetes with peripheral autonomic neuropathy ([place]) 05/09/2015    Past Surgical History:  Procedure Laterality Date  . CARDIAC CATHETERIZATION N/A 05/02/2015   Procedure: Left Heart Cath and Coronary Angiography;  Surgeon: Lorretta Harp, MD;  Location: [facility] CV LAB;  Service: Cardiovascular;  Laterality: N/A;  . CARDIAC CATHETERIZATION N/A 05/04/2015   Procedure: Left Heart Cath and Coronary Angiography;  Surgeon: Wellington Hampshire, MD;  Location: [place] [facility];  Service: Cardiovascular;  Laterality: N/A;  . CARPAL TUNNEL RELEASE Bilateral ~ 2003-2004  . CHOLECYSTECTOMY OPEN  1970's?  Marland Kitchen GASTROPLASTY    . JOINT REPLACEMENT    . LEFT HEART CATHETERIZATION WITH CORONARY ANGIOGRAM N/A 10/24/2011   Procedure: LEFT HEART CATHETERIZATION WITH CORONARY ANGIOGRAM;  Surgeon: Candee Furbish, MD;  Location: [facility] CATH LAB;  Service: Cardiovascular;  Laterality: N/A;  right radial artery approach  . TOTAL KNEE ARTHROPLASTY Right 1990's    Social History   Social History  . Marital status: Widowed    Spouse name: N/A  . Number of children: N/A  . Years of education: N/A   Occupational History  . Retired    Social History Main Topics  . Smoking status: Former Smoker    Packs/day: 2.00    Years: 20.00  Types: Cigarettes    Quit date: 09/10/1976  . Smokeless tobacco: Never Used  . Alcohol use No     Comment: FORMER ALCOHOLIC; "sober since 19/50/9326"  . Drug use: No  . Sexual activity: No   Other Topics Concern  . Not on file   Social History Narrative   Lives alone.  No close family nearby.     Family History  Problem Relation Age of Onset  . Diabetes type I Father   . Hypertension Mother   . Cancer Brother         Testicular Cancer  . Cancer Brother        Leukemia      VITAL SIGNS BP (!) 146/68   Pulse 70   Temp 98.7 F (37.1 C)   Resp 18   Ht 6\' 2"  (1.88 m)   Wt (!) 370 lb 6 oz (168 kg)   SpO2 97%    BMI 47.55 kg/m   Patient's Medications  New Prescriptions   No medications on file  Previous Medications   ACETAMINOPHEN (TYLENOL) 500 MG TABLET    Take 1,000 mg by mouth 3 (three) times daily.   ALUMINUM-MAGNESIUM HYDROXIDE-SIMETHICONE (MAALOX) 712-458-09 MG/5ML SUSP    Take 30 mLs by mouth every 6 (six) hours as needed.   AMLODIPINE (NORVASC) 5 MG TABLET    Take 5 mg by mouth daily.   ASPIRIN EC 81 MG TABLET    Take 81 mg by mouth daily. 0900   DICLOFENAC SODIUM (VOLTAREN) 1 % GEL    Apply topically 3 (three) times daily. Apply to left shoulder topically three times daily    ESCITALOPRAM (LEXAPRO) 10 MG TABLET    Take 10 mg by mouth daily.    EZETIMIBE (ZETIA) 10 MG TABLET    Take 10 mg by mouth daily.   FOLIC ACID (FOLVITE) 1 MG TABLET    Take 1 mg by mouth daily.   GABAPENTIN (NEURONTIN) 100 MG CAPSULE    Take 1 capsule (100 mg total) by mouth 2 (two) times daily.   GUAIFENESIN (MUCINEX) 600 MG 12 HR TABLET    Take 600 mg by mouth 2 (two) times daily.   HYDROXYZINE (ATARAX/VISTARIL) 25 MG TABLET    Take 25 mg by mouth 3 (three) times daily.    INSULIN GLARGINE (TOUJEO SOLOSTAR) 300 UNIT/ML SOPN    Inject 40 Units into the skin at bedtime.    INSULIN LISPRO (HUMALOG) 100 UNIT/ML INJECTION    Inject as per sliding scale subcutaneously with meals.  Pt may self administer 0-70 = 0 units 71-450 = 10 units 451-600 Notify MD   IPRATROPIUM-ALBUTEROL (DUONEB) 0.5-2.5 (3) MG/3ML SOLN    Take 3 mLs by nebulization every 6 (six) hours as needed (sob).    METHOTREXATE (RHEUMATREX) 10 MG TABLET    Take 10 mg by mouth every Wednesday. Caution: Chemotherapy. Protect from light.   METOPROLOL TARTRATE (LOPRESSOR) 25 MG TABLET    Take 12.5 mg by mouth 2 (two) times daily.    MULTIPLE VITAMINS-MINERALS (DECUBI-VITE) CAPS    Take 1 capsule by mouth daily at 3 pm.   NITROGLYCERIN (NITROSTAT) 0.4 MG SL TABLET    Place 0.4 mg under the tongue every 5 (five) minutes as needed for chest pain.   OMEPRAZOLE  (PRILOSEC) 20 MG CAPSULE    Take 20 mg by mouth daily.   OXYGEN    Inhale into the lungs. O2 at 2l/min via nasal canula as needed for shortness of breath  POTASSIUM CHLORIDE (KLOR-CON) 20 MEQ PACKET    Take 40 mEq by mouth daily. 0800   PREDNISONE (DELTASONE) 10 MG TABLET    Take 10 mg by mouth daily.   ROPINIROLE (REQUIP) 0.5 MG TABLET    Take 0.5 mg by mouth at bedtime. 2100   SENNOSIDES-DOCUSATE SODIUM (SENOKOT-S) 8.6-50 MG TABLET    Take 2 tablets by mouth at bedtime. 2100   TORSEMIDE (DEMADEX) 20 MG TABLET    Take 20 mg by mouth daily.    TRAMADOL (ULTRAM) 50 MG TABLET    Take 100 mg by mouth 3 (three) times daily. Hold if Lethargic or confused   TRAZODONE (DESYREL) 50 MG TABLET    Take 50 mg by mouth at bedtime.   Modified Medications   No medications on file  Discontinued Medications   ACETAMINOPHEN (TYLENOL) 325 MG TABLET    Take 2 tablets (650 mg total) by mouth every 6 (six) hours as needed for mild pain (or Fever >/= 101).   TRAMADOL (ULTRAM) 50 MG TABLET    Take 1 tablet by mouth every 6 hours as needed   VANCOMYCIN 750 MG IN SODIUM CHLORIDE 0.9 % 150 ML    Inject 750 mg into the vein every 8 (eight) hours.     SIGNIFICANT DIAGNOSTIC EXAMS  PREVIOUS  10-18-15: EEG: This is a normal EEG for the patients stated age.  There were no focal, hemispheric or lateralizing features.  No epileptiform activity was recorded.  A normal EEG does not exclude the diagnosis of a seizure disorder and if seizure remains high on the list of differential diagnosis, an ambulatory EEG may be of value.  Clinical correlation is required.  01-11-16: ct of head: No acute intracranial abnormality. Stable mild cerebral atrophy and chronic white matter disease. No definite acute cortical infarction.  01-11-16: scrotal ultrasound: 1. Left epididymo-orchitis. 2. Microlithiasis involving the left testicle. 3. Three adjacent complex cysts/spermatoceles involving the left epididymal head.  05-27-17: ct of abdomen  and pelvis  1. Negative for hydronephrosis. Nonspecific perinephric fat stranding. Foley catheter is present in the bladder which is empty, the tip of the catheter appears to tent the superior wall of the bladder. 2. There are no acute intra-abdominal or pelvic abnormalities seen. 3. 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 Bilateral hip joint degenerative changes, right greater than left but no hip fracture, AVN or findings to suggest septic arthritis. 2. Significant artifact from the patient's skin touching the gantry. Suspect mild cellulitis but no obvious abscess, myofasciitis or Pyomyositis.  05-31-17: 2-d echo:   Left ventricle: The cavity size was normal. Wall thickness was increased in a pattern of mild LVH. Systolic function was normal. The estimated ejection fraction was in the range of 55% to 60%. Doppler parameters are consistent with abnormal left ventricular relaxation (grade 1 diastolic dysfunction). - Aortic valve: There was mild stenosis. There was trivial regurgitation.  - Left atrium: The atrium was moderately dilated. - Atrial septum: No defect or patent foramen ovale was identified  NO NEW EXAMS .  LABS REVIEWED: PREVIOUS     07-20-16: stool for c-diff: +  07-30-16: wbc 15.1; hgb 12.4; hct 37.7; mcv 93.5; plt 289; glucose 322; bun 28.3; creat 0.91; k+ 4.3; na++ 137; liver normal albumin 3.4  12-13-16: urine micro-albumin 54.44 01-29-17: wbc 15.0; hgb 12.6; hct 38.1; mcv 93.0; plt 255; glucose 418; bun 29.1 creat 1.03; k+ 4.1; n++ 136; ca 8.6; liver normal albumin 3.3;  hgb a1c 9.1  05-08-17: hgb a1c 9.8; chol 190; ldl 92; trig 299; hdl 38  05-27-17: wbc 17.5; hgb 11.8; hct 35.8; mcv 90.2; plt 271; glucose 222; bun 16; creat 0.95; k+ 3.6; na++ 135; ca 8.4; CRP 17.1; sed rate 48; blood culture: no growth; urine culture: e-coli: esbl 05-28-17: wbc 13.3; hgb 11.5; hct 34.9; mcv 88.4; plt 252; glucose 208; bun 14; creat 0.87; k+ 3.2; na++ 135; ca 8.1  AM cortisol 9.5 05-30-17: wbc 7.9; hgb 11.4; hct 35.0; mcv 88.6; plt 279; glucose 175; bun 7; creat 0.74; k+ 3.5; na++ 134; ca 8.2  06-02-17: folate 19.0; vit B12: 357; ferritin 123; iron 45; TIBC 227 06-04-17: wbc 10.0; hgb 10.6; hct 33.3; mcv 91.0; plt 273  NO NEW LABS   Review of Systems  Constitutional: Negative for malaise/fatigue.  Respiratory: Negative for cough and shortness of breath.   Cardiovascular: Negative for chest pain, palpitations and leg swelling.  Gastrointestinal: Negative for abdominal pain, constipation and heartburn.  Musculoskeletal: Negative for back pain, joint pain and myalgias.  Skin: Negative.   Neurological: Negative for dizziness.  Psychiatric/Behavioral: The patient is not nervous/anxious.     Physical Exam  Constitutional: He is oriented to person, place, and time. He appears well-developed and well-nourished. No distress.  Morbid obesity   Neck: Neck supple. No thyromegaly present.  Cardiovascular: Normal rate, regular rhythm, normal heart sounds and intact distal pulses.  Pulmonary/Chest: Breath sounds normal. No respiratory distress.  Abdominal: Soft. Bowel sounds are normal. He exhibits no distension.  Genitourinary:  Genitourinary Comments: Has foley has penile tear   Musculoskeletal: He exhibits no edema.  Is able move all extremities   Lymphadenopathy:    He has no cervical adenopathy.  Neurological: He is alert and oriented to person, place, and time.  Skin: Skin is warm and dry. He is not diaphoretic.  Lower extremities discolored   Psychiatric: He has a normal mood and affect.    ASSESSMENT/ PLAN:  TODAY:   1. Hypertension: is stable; b/p 146/68: will continue norvasc 5 mg daily lopressor 12.5 mg twice daily   2. Chronic diastolic heart failure: stable; will continue demadex 20 mg twice daily   asa 81 mg daily lopressor 12.5 mg twice daily and has ntg 0.4 mg every 5 mins X 3 as needed  3.  GERD: stable will continue prilosec 20 mg  daily   4. Diabetic neuropathy: stable hgb a1c 9.8: will continue neurontin 100 mg twice daily  5. Hypokalemia: k+ 3.5; will stop kdur and will begin klor con 10 meq tabs 40 meq daily and will monitor   PREVIOUS:    6. Dyslipidemia: stable  ldl 92; will continue zetia 10 mg daily   7. Diabetes: without change hgb a1c 9.8: will continue tuojeo 40 units nightly and 10 units with meals  8.  Allergic rhinitis: stable will continue mucinex 600 mg twice daily and duoneb every 6 hours as needed   9. Bullous pemphigoid: stable will continue methotrexte 10 mg weekly with folic acid 1 mg daily and is on long term prednisone 10 mg daily   10. Restless leg syndrome: is stable will continue requip 0.5 mg nightly   11. Depression: is stable will continue lexapro 10 mg daily and takes trazodone 50 mg nightly   12. Constipation: stable will continue senna s 2 tabs daily   13. Pruritis: stable will continue atarax 25 mg three times daily    MD is aware of resident's narcotic use and  is in agreement with current plan of care. We will attempt to wean resident as apropriate     Ok Edwards NP [facility]  Contact [phone] Monday through Friday 8am- 5pm  After hours call [phone]

## 2017-07-10 LAB — HEMOGLOBIN A1C: Hemoglobin A1C: 8.8

## 2017-07-12 DIAGNOSIS — S46012S Strain of muscle(s) and tendon(s) of the rotator cuff of left shoulder, sequela: Secondary | ICD-10-CM | POA: Diagnosis not present

## 2017-07-16 DIAGNOSIS — S46012S Strain of muscle(s) and tendon(s) of the rotator cuff of left shoulder, sequela: Secondary | ICD-10-CM | POA: Diagnosis not present

## 2017-07-17 ENCOUNTER — Encounter: Payer: Self-pay | Admitting: Adult Health

## 2017-07-17 ENCOUNTER — Non-Acute Institutional Stay (SKILLED_NURSING_FACILITY): Payer: Medicare Other | Admitting: Adult Health

## 2017-07-17 DIAGNOSIS — E1143 Type 2 diabetes mellitus with diabetic autonomic (poly)neuropathy: Secondary | ICD-10-CM

## 2017-07-17 DIAGNOSIS — Z8673 Personal history of transient ischemic attack (TIA), and cerebral infarction without residual deficits: Secondary | ICD-10-CM | POA: Diagnosis not present

## 2017-07-17 DIAGNOSIS — IMO0002 Reserved for concepts with insufficient information to code with codable children: Secondary | ICD-10-CM

## 2017-07-17 DIAGNOSIS — E1165 Type 2 diabetes mellitus with hyperglycemia: Secondary | ICD-10-CM | POA: Diagnosis not present

## 2017-07-17 DIAGNOSIS — I5032 Chronic diastolic (congestive) heart failure: Secondary | ICD-10-CM

## 2017-07-17 NOTE — Progress Notes (Signed)
Location:   [facility] Room Number: 216 B Place of Service:  SNF (31)   CODE STATUS: Full Code  Allergies  Allergen Reactions  . Ace Inhibitors Swelling    Pt tolerates lisinopril  . Lipitor [Atorvastatin Calcium] Swelling  . Metformin And Related Swelling  . Morphine And Related Other (See Comments)    Sweating, feels like is "in rocky boat."  . Robaxin [Methocarbamol] Other (See Comments)    Feels like he is shaky    Chief Complaint  Patient presents with  . Acute Visit    Care Plan Meeting    HPI:  We have come together with the care plan team for his care plan meeting. We did discuss his nursing concerns and were addressed. We did discuss his physical therapy he did sit up on the edge of his bed. We did discuss his diabetes; his medication regimen. We did discuss his MOST form. There are no nursing concerns at this time.   Past Medical History:  Diagnosis Date  . Arthritis    "hands, ankles, knees" (11/10/2014)  . Bladder spasms   . CAD (coronary artery disease)   . CAD in native artery 09/15/2015  . CHF (congestive heart failure) ([place])   . Chronic indwelling Foley catheter   . Chronic lower back pain   . Depression   . Diabetic diarrhea ([place])   . Diabetic neuropathy, painful ([place])   . DVT (deep venous thrombosis) ([facility]) 1980's   LLE  . GERD (gastroesophageal reflux disease)   . Hyperlipidemia   . Hypertension   . Neurogenic bladder   . Obese   . OSA (obstructive sleep apnea)    "they wanted me to wear a mask; I couldn't" (11/10/2014)  . Pneumonia 1999  . PONV (postoperative nausea and vomiting)   . Stroke [facility]) 10/2011; 06/2014   right hand numbness/notes 10/20/2011, pt not sure he had stroke in 2013; "right hand weaker and right face not quite right since" (11/10/2014)  . Type II diabetes mellitus ([place])   . 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  CARPAL TUNNEL RELEASE Bilateral ~ 2003-2004  . CHOLECYSTECTOMY OPEN  1970's?  Marland Kitchen GASTROPLASTY    . JOINT REPLACEMENT    . TOTAL KNEE ARTHROPLASTY Right 1990's    Social History   Socioeconomic History  . Marital status: Widowed    Spouse name: Not on file  . Number of children: Not on file  . Years of education: Not on file  . Highest education level: Not on file  Social Needs  . Financial resource strain: Not on file  . Food insecurity - worry: Not on file  . Food insecurity - inability: Not on file  . Transportation needs - medical: Not on file  . Transportation needs - non-medical: Not on file  Occupational History  . Occupation: Retired  Tobacco Use  . Smoking status: Former Smoker    Packs/day: 2.00    Years: 20.00    Pack years: 40.00    Types: Cigarettes    Last attempt to quit: 09/10/1976    Years since quitting: 40.8  . Smokeless tobacco: Never Used  Substance and Sexual Activity  . Alcohol use: No    Alcohol/week: 0.0 oz    Comment: FORMER ALCOHOLIC; "sober since 63/87/5643"  . Drug use: No  . Sexual activity: No  Other Topics Concern  . Not on file  Social History Narrative   Lives alone.  No close family nearby.     Family History  Problem Relation Age of Onset  . Diabetes type I Father   . Hypertension Mother   . Cancer Brother         Testicular Cancer  . Cancer Brother        Leukemia      VITAL SIGNS BP 134/68   Pulse 86   Temp (!) 97.1 F (36.2 C)   Resp 20   Ht 6\' 2"  (1.88 m)   Wt (!) 370 lb 6 oz (168 kg)   SpO2 98%   BMI 47.55 kg/m      Medication List        Accurate as of 07/17/17  9:39 AM. Always use your most recent med list.          acetaminophen 500 MG tablet Commonly known as:  TYLENOL   aluminum-magnesium hydroxide-simethicone 381-017-51 MG/5ML Susp Commonly known as:  MAALOX   aspirin EC 81 MG tablet   DECUBI-VITE Caps   diclofenac sodium 1 % Gel Commonly known as:  VOLTAREN   escitalopram 10 MG  tablet Commonly known as:  LEXAPRO   ezetimibe 10 MG tablet Commonly known as:  ZETIA   folic acid 1 MG tablet Commonly known as:  FOLVITE   gabapentin 100 MG capsule Commonly known as:  NEURONTIN Take 1 capsule (100 mg total) by mouth 2 (two) times daily.   guaiFENesin 600 MG 12 hr tablet Commonly known as:  MUCINEX   HUMALOG 100 UNIT/ML injection Generic drug:  insulin lispro   hydrOXYzine 25 MG tablet Commonly known as:  ATARAX/VISTARIL   ipratropium-albuterol 0.5-2.5 (3) MG/3ML Soln Commonly known as:  DUONEB   methotrexate 10 MG tablet Commonly known as:  RHEUMATREX   metoprolol tartrate 25 MG tablet Commonly known as:  LOPRESSOR   nitroGLYCERIN 0.4 MG SL tablet Commonly known as:  NITROSTAT   NORVASC 5 MG tablet Generic drug:  amLODipine   omeprazole 20 MG capsule Commonly known as:  PRILOSEC   OXYGEN   potassium chloride 10 MEQ tablet Commonly known as:  K-DUR   predniSONE 10 MG tablet Commonly known as:  DELTASONE   rOPINIRole 0.5 MG tablet Commonly known as:  REQUIP   sennosides-docusate sodium 8.6-50 MG tablet Commonly known as:  SENOKOT-S   torsemide 20 MG tablet Commonly known as:  DEMADEX   TOUJEO SOLOSTAR 300 UNIT/ML Sopn Generic drug:  Insulin Glargine   traMADol 50 MG tablet Commonly known as:  ULTRAM   traZODone 50 MG tablet Commonly known as:  DESYREL        SIGNIFICANT DIAGNOSTIC EXAMS   PREVIOUS  10-18-15: EEG: This is a normal EEG for the patients stated age.  There were no focal, hemispheric or lateralizing features.  No epileptiform activity was recorded.  A normal EEG does not exclude the diagnosis of a seizure disorder and if seizure remains high on the list of differential diagnosis, an ambulatory EEG may be of value.  Clinical correlation is required.  01-11-16: ct of head: No acute intracranial abnormality. Stable mild cerebral atrophy and chronic white matter disease. No definite acute cortical  infarction.  01-11-16: scrotal ultrasound: 1. Left epididymo-orchitis. 2. Microlithiasis involving the left testicle. 3. Three adjacent complex cysts/spermatoceles involving the left epididymal head.  05-27-17: ct of abdomen and pelvis  1. Negative for hydronephrosis. Nonspecific perinephric fat stranding. Foley catheter is present in the bladder which is empty, the  tip of the catheter appears to tent the superior wall of the bladder. 2. There are no acute intra-abdominal or pelvic abnormalities seen. 3. 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 Bilateral hip joint degenerative changes, right greater than left but no hip fracture, AVN or findings to suggest septic arthritis. 2. Significant artifact from the patient's skin touching the gantry. Suspect mild cellulitis but no obvious abscess, myofasciitis or Pyomyositis.  05-31-17: 2-d echo:   Left ventricle: The cavity size was normal. Wall thickness was increased in a pattern of mild LVH. Systolic function was normal. The estimated ejection fraction was in the range of 55% to 60%. Doppler parameters are consistent with abnormal left ventricular relaxation (grade 1 diastolic dysfunction). - Aortic valve: There was mild stenosis. There was trivial regurgitation.  - Left atrium: The atrium was moderately dilated. - Atrial septum: No defect or patent foramen ovale was identified  NO NEW EXAMS .  LABS REVIEWED: PREVIOUS     07-20-16: stool for c-diff: +  07-30-16: wbc 15.1; hgb 12.4; hct 37.7; mcv 93.5; plt 289; glucose 322; bun 28.3; creat 0.91; k+ 4.3; na++ 137; liver normal albumin 3.4  12-13-16: urine micro-albumin 54.44 01-29-17: wbc 15.0; hgb 12.6; hct 38.1; mcv 93.0; plt 255; glucose 418; bun 29.1 creat 1.03; k+ 4.1; n++ 136; ca 8.6; liver normal albumin 3.3; hgb a1c 9.1  05-08-17: hgb a1c 9.8; chol 190; ldl 92; trig 299; hdl 38  05-27-17: wbc 17.5; hgb 11.8; hct 35.8; mcv 90.2; plt 271; glucose 222; bun 16; creat  0.95; k+ 3.6; na++ 135; ca 8.4; CRP 17.1; sed rate 48; blood culture: no growth; urine culture: e-coli: esbl 05-28-17: wbc 13.3; hgb 11.5; hct 34.9; mcv 88.4; plt 252; glucose 208; bun 14; creat 0.87; k+ 3.2; na++ 135; ca 8.1 AM cortisol 9.5 05-30-17: wbc 7.9; hgb 11.4; hct 35.0; mcv 88.6; plt 279; glucose 175; bun 7; creat 0.74; k+ 3.5; na++ 134; ca 8.2  06-02-17: folate 19.0; vit B12: 357; ferritin 123; iron 45; TIBC 227 06-04-17: wbc 10.0; hgb 10.6; hct 33.3; mcv 91.0; plt 273  NO NEW LABS   Review of Systems  Constitutional: Negative for malaise/fatigue.  Respiratory: Negative for cough and shortness of breath.   Cardiovascular: Negative for chest pain, palpitations and leg swelling.  Gastrointestinal: Negative for abdominal pain, constipation and heartburn.  Musculoskeletal: Negative for back pain, joint pain and myalgias.  Skin: Negative.   Neurological: Negative for dizziness.  Psychiatric/Behavioral: The patient is not nervous/anxious.    Physical Exam  Constitutional: He is oriented to person, place, and time. He appears well-developed. No distress.  obese  Neck: Neck supple. Carotid bruit is present. No thyromegaly present.  Cardiovascular: Normal rate, regular rhythm and intact distal pulses.  Murmur heard. 2/6  Pulmonary/Chest: Effort normal and breath sounds normal. No respiratory distress.  Abdominal: Soft. Bowel sounds are normal. He exhibits no distension. There is no tenderness.  Genitourinary:  Genitourinary Comments: Foley has penile tear   Musculoskeletal: He exhibits no edema.  Able to move extremities   Lymphadenopathy:    He has no cervical adenopathy.  Neurological: He is alert and oriented to person, place, and time.  Skin: Skin is warm and dry. He is not diaphoretic.  Bilateral lower extremities discolored   Psychiatric: He has a normal mood and affect.     ASSESSMENT/ PLAN:  1. Chronic diastolic heart failure 2. Uncontrolled type II diabetes with  peripheral autonomic neuropathy 3. History  of cva  Will continue his current plan of care without changes We have discussed his MOST form  We did discuss his medications; medical status  Time spent with patient and care plan team: 35 minutes: discussed his medical status; his progress in therapy; his medication regimen and his MOST form with the pros and cons of his choices made. He did verbalize understanding   MD is aware of resident's narcotic use and is in agreement with current plan of care. We will attempt to wean resident as apropriate   Ok Edwards NP [facility]  Contact [phone] Monday through Friday 8am- 5pm  After hours call [phone]

## 2017-07-18 DIAGNOSIS — M6281 Muscle weakness (generalized): Secondary | ICD-10-CM | POA: Diagnosis not present

## 2017-07-24 DIAGNOSIS — M6281 Muscle weakness (generalized): Secondary | ICD-10-CM | POA: Diagnosis not present

## 2017-07-25 DIAGNOSIS — G4700 Insomnia, unspecified: Secondary | ICD-10-CM | POA: Diagnosis not present

## 2017-07-25 DIAGNOSIS — F329 Major depressive disorder, single episode, unspecified: Secondary | ICD-10-CM | POA: Diagnosis not present

## 2017-07-26 DIAGNOSIS — M6281 Muscle weakness (generalized): Secondary | ICD-10-CM | POA: Diagnosis not present

## 2017-07-30 ENCOUNTER — Other Ambulatory Visit: Payer: Self-pay

## 2017-07-30 DIAGNOSIS — M6281 Muscle weakness (generalized): Secondary | ICD-10-CM | POA: Diagnosis not present

## 2017-07-30 DIAGNOSIS — S46012S Strain of muscle(s) and tendon(s) of the rotator cuff of left shoulder, sequela: Secondary | ICD-10-CM | POA: Diagnosis not present

## 2017-07-30 MED ORDER — TRAMADOL HCL 50 MG PO TABS: 100.0000 mg | ORAL_TABLET | Freq: Three times a day (TID) | ORAL | 0 refills | Status: DC

## 2017-07-30 NOTE — Telephone Encounter (Signed)
RX faxed to [facility] @ [phone], phone number [phone] 

## 2017-08-07 DIAGNOSIS — M6281 Muscle weakness (generalized): Secondary | ICD-10-CM | POA: Diagnosis not present

## 2017-08-07 DIAGNOSIS — S46012S Strain of muscle(s) and tendon(s) of the rotator cuff of left shoulder, sequela: Secondary | ICD-10-CM | POA: Diagnosis not present

## 2017-08-08 ENCOUNTER — Non-Acute Institutional Stay (SKILLED_NURSING_FACILITY): Payer: Medicare Other | Admitting: Adult Health

## 2017-08-08 ENCOUNTER — Encounter: Payer: Self-pay | Admitting: Adult Health

## 2017-08-08 DIAGNOSIS — L109 Pemphigus, unspecified: Secondary | ICD-10-CM

## 2017-08-08 DIAGNOSIS — E1165 Type 2 diabetes mellitus with hyperglycemia: Secondary | ICD-10-CM | POA: Diagnosis not present

## 2017-08-08 DIAGNOSIS — E1169 Type 2 diabetes mellitus with other specified complication: Secondary | ICD-10-CM

## 2017-08-08 DIAGNOSIS — E785 Hyperlipidemia, unspecified: Secondary | ICD-10-CM

## 2017-08-08 DIAGNOSIS — E1143 Type 2 diabetes mellitus with diabetic autonomic (poly)neuropathy: Secondary | ICD-10-CM | POA: Diagnosis not present

## 2017-08-08 DIAGNOSIS — J3089 Other allergic rhinitis: Secondary | ICD-10-CM

## 2017-08-08 DIAGNOSIS — IMO0002 Reserved for concepts with insufficient information to code with codable children: Secondary | ICD-10-CM

## 2017-08-08 NOTE — Progress Notes (Signed)
Location:   [facility] Room Number: 216 B Place of Service:  SNF (31)   CODE STATUS: Full Code  Allergies  Allergen Reactions  . Ace Inhibitors Swelling    Pt tolerates lisinopril  . Lipitor [Atorvastatin Calcium] Swelling  . Metformin And Related Swelling  . Morphine And Related Other (See Comments)    Sweating, feels like is "in rocky boat."  . Robaxin [Methocarbamol] Other (See Comments)    Feels like he is shaky    Chief Complaint  Patient presents with  . 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 His cbgs remain elevated; at this time he does not want to have his insulins increased. He denies itching; rach; no excessive thirst or hunger. There are no nursing concerns at this time.   Past Medical History:  Diagnosis Date  . Arthritis    "hands, ankles, knees" (11/10/2014)  . Bladder spasms   . CAD (coronary artery disease)   . CAD in native artery 09/15/2015  . CHF (congestive heart failure) ([place])   . Chronic indwelling Foley catheter   . Chronic lower back pain   . Depression   . Diabetic diarrhea ([place])   . Diabetic neuropathy, painful ([place])   . DVT (deep venous thrombosis) ([facility]) 1980's   LLE  . GERD (gastroesophageal reflux disease)   . Hyperlipidemia   . Hypertension   . Neurogenic bladder   . Obese   . OSA (obstructive sleep apnea)    "they wanted me to wear a mask; I couldn't" (11/10/2014)  . Pneumonia 1999  . PONV (postoperative nausea and vomiting)   . Stroke [facility]) 10/2011; 06/2014   right hand numbness/notes 10/20/2011, pt not sure he had stroke in 2013; "right hand weaker and right face not quite right since"  (11/10/2014)  . Type II diabetes mellitus ([place])   . Uncontrolled type II diabetes with peripheral autonomic neuropathy ([place]) 05/09/2015    Past Surgical History:  Procedure Laterality Date  . CARDIAC CATHETERIZATION N/A 05/02/2015   Procedure: Left Heart Cath and Coronary Angiography;  Surgeon: Lorretta Harp, MD;  Location: [facility] CV LAB;  Service: Cardiovascular;  Laterality: N/A;  . CARDIAC CATHETERIZATION N/A 05/04/2015   Procedure: Left Heart Cath and Coronary Angiography;  Surgeon: Wellington Hampshire, MD;  Location: [facility] CV LAB;  Service: Cardiovascular;  Laterality: N/A;  . CARPAL TUNNEL RELEASE Bilateral ~ 2003-2004  . CHOLECYSTECTOMY OPEN  1970's?  Marland Kitchen GASTROPLASTY    . JOINT REPLACEMENT    . LEFT HEART CATHETERIZATION WITH CORONARY ANGIOGRAM N/A 10/24/2011   Procedure: LEFT HEART CATHETERIZATION WITH CORONARY ANGIOGRAM;  Surgeon: Candee Furbish, MD;  Location: [facility] CATH LAB;  Service: Cardiovascular;  Laterality: N/A;  right radial artery approach  . TOTAL KNEE ARTHROPLASTY Right 1990's    Social History   Socioeconomic History  . Marital status: Widowed    Spouse name: Not on file  . Number of children: Not on file  . Years of education: Not on file  . Highest education level: Not on file  Social Needs  . Financial resource strain: Not on file  . Food insecurity - worry: Not on file  . Food insecurity -  inability: Not on file  . Transportation needs - medical: Not on file  . Transportation needs - non-medical: Not on file  Occupational History  . Occupation: Retired  Tobacco Use  . Smoking status: Former Smoker    Packs/day: 2.00    Years: 20.00    Pack years: 40.00    Types: Cigarettes    Last attempt to quit: 09/10/1976    Years since quitting: 40.9  . Smokeless tobacco: Never Used  Substance and Sexual Activity  . Alcohol use: No    Alcohol/week: 0.0 oz    Comment: FORMER ALCOHOLIC; "sober since 28/78/6767"  . Drug use: No  . Sexual activity: No  Other  Topics Concern  . Not on file  Social History Narrative  . Not on file   Family History  Problem Relation Age of Onset  . Diabetes type I Father   . Hypertension Mother   . Cancer Brother         Testicular Cancer  . Cancer Brother        Leukemia      VITAL SIGNS Ht 6\' 2"  (1.88 m)   Wt (!) 348 lb 3.2 oz (157.9 kg)   BMI 44.71 kg/m   Outpatient Encounter Medications as of 08/08/2017  Medication Sig  . aluminum-magnesium hydroxide-simethicone (MAALOX) 209-470-96 MG/5ML SUSP Take 30 mLs by mouth every 6 (six) hours as needed.  Marland Kitchen amLODipine (NORVASC) 5 MG tablet Take 5 mg by mouth daily.  Marland Kitchen aspirin EC 81 MG tablet Take 81 mg by mouth daily. 0900  . diclofenac sodium (VOLTAREN) 1 % GEL Apply topically 3 (three) times daily. Apply to left shoulder topically three times daily   . escitalopram (LEXAPRO) 10 MG tablet Take 10 mg by mouth daily.   Marland Kitchen ezetimibe (ZETIA) 10 MG tablet Take 10 mg by mouth daily.  . folic acid (FOLVITE) 1 MG tablet Take 1 mg by mouth daily.  Marland Kitchen gabapentin (NEURONTIN) 100 MG capsule Take 1 capsule (100 mg total) by mouth 2 (two) times daily.  Marland Kitchen guaiFENesin (MUCINEX) 600 MG 12 hr tablet Take 600 mg by mouth 2 (two) times daily.  . hydrOXYzine (ATARAX/VISTARIL) 25 MG tablet Take 25 mg by mouth 3 (three) times daily.   . Insulin Glargine (TOUJEO SOLOSTAR) 300 UNIT/ML SOPN Inject 40 Units into the skin at bedtime.   . insulin lispro (HUMALOG) 100 UNIT/ML injection Inject as per sliding scale subcutaneously with meals.  Pt may self administer 0-70 = 0 units 71-450 = 10 units 451-600 Notify MD  . ipratropium-albuterol (DUONEB) 0.5-2.5 (3) MG/3ML SOLN Take 3 mLs by nebulization every 6 (six) hours as needed (sob).   . methotrexate (RHEUMATREX) 10 MG tablet Take 10 mg by mouth every Wednesday. Caution: Chemotherapy. Protect from light.  . metoprolol tartrate (LOPRESSOR) 25 MG tablet Take 12.5 mg by mouth 2 (two) times daily.   . Multiple Vitamins-Minerals  (DECUBI-VITE) CAPS Take 1 capsule by mouth daily at 3 pm.  . nitroGLYCERIN (NITROSTAT) 0.4 MG SL tablet Place 0.4 mg under the tongue every 5 (five) minutes as needed for chest pain.  Marland Kitchen omeprazole (PRILOSEC) 20 MG capsule Take 20 mg by mouth daily.  . OXYGEN Inhale into the lungs. O2 at 2l/min via nasal canula as needed for shortness of breath  . potassium chloride (K-DUR) 10 MEQ tablet Give 4 tablets by mouth daily  . predniSONE (DELTASONE) 10 MG tablet Take 10 mg by mouth daily.  Marland Kitchen rOPINIRole (REQUIP) 0.5 MG tablet Take 0.5 mg  by mouth at bedtime. 2100  . sennosides-docusate sodium (SENOKOT-S) 8.6-50 MG tablet Take 2 tablets by mouth at bedtime. 2100  . torsemide (DEMADEX) 20 MG tablet Take 20 mg by mouth daily.   . traMADol (ULTRAM) 50 MG tablet Take 2 tablets (100 mg total) by mouth 3 (three) times daily. Hold if Lethargic or confused  . traZODone (DESYREL) 50 MG tablet Take 50 mg by mouth at bedtime.   Marland Kitchen acetaminophen (TYLENOL) 500 MG tablet Take 1,000 mg by mouth 3 (three) times daily.   No facility-administered encounter medications on file as of 08/08/2017.      SIGNIFICANT DIAGNOSTIC EXAMS  PREVIOUS  10-18-15: EEG: This is a normal EEG for the patients stated age.  There were no focal, hemispheric or lateralizing features.  No epileptiform activity was recorded.  A normal EEG does not exclude the diagnosis of a seizure disorder and if seizure remains high on the list of differential diagnosis, an ambulatory EEG may be of value.  Clinical correlation is required.  01-11-16: ct of head: No acute intracranial abnormality. Stable mild cerebral atrophy and chronic white matter disease. No definite acute cortical infarction.  01-11-16: scrotal ultrasound: 1. Left epididymo-orchitis. 2. Microlithiasis involving the left testicle. 3. Three adjacent complex cysts/spermatoceles involving the left epididymal head.  05-27-17: ct of abdomen and pelvis  1. Negative for hydronephrosis. Nonspecific  perinephric fat stranding. Foley catheter is present in the bladder which is empty, the tip of the catheter appears to tent the superior wall of the bladder. 2. There are no acute intra-abdominal or pelvic abnormalities seen. 3. 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 Bilateral hip joint degenerative changes, right greater than left but no hip fracture, AVN or findings to suggest septic arthritis. 2. Significant artifact from the patient's skin touching the gantry. Suspect mild cellulitis but no obvious abscess, myofasciitis or Pyomyositis.  05-31-17: 2-d echo:   Left ventricle: The cavity size was normal. Wall thickness was increased in a pattern of mild LVH. Systolic function was normal. The estimated ejection fraction was in the range of 55% to 60%. Doppler parameters are consistent with abnormal left ventricular relaxation (grade 1 diastolic dysfunction). - Aortic valve: There was mild stenosis. There was trivial regurgitation.  - Left atrium: The atrium was moderately dilated. - Atrial septum: No defect or patent foramen ovale was identified  NO NEW EXAMS .  LABS REVIEWED: PREVIOUS     12-13-16: urine micro-albumin 54.44 01-29-17: wbc 15.0; hgb 12.6; hct 38.1; mcv 93.0; plt 255; glucose 418; bun 29.1 creat 1.03; k+ 4.1; n++ 136; ca 8.6; liver normal albumin 3.3; hgb a1c 9.1  05-08-17: hgb a1c 9.8; chol 190; ldl 92; trig 299; hdl 38  05-27-17: wbc 17.5; hgb 11.8; hct 35.8; mcv 90.2; plt 271; glucose 222; bun 16; creat 0.95; k+ 3.6; na++ 135; ca 8.4; CRP 17.1; sed rate 48; blood culture: no growth; urine culture: e-coli: esbl 05-28-17: wbc 13.3; hgb 11.5; hct 34.9; mcv 88.4; plt 252; glucose 208; bun 14; creat 0.87; k+ 3.2; na++ 135; ca 8.1 AM cortisol 9.5 05-30-17: wbc 7.9; hgb 11.4; hct 35.0; mcv 88.6; plt 279; glucose 175; bun 7; creat 0.74; k+ 3.5; na++ 134; ca 8.2  06-02-17: folate 19.0; vit B12: 357; ferritin 123; iron 45; TIBC 227 06-04-17: wbc 10.0; hgb  10.6; hct 33.3; mcv 91.0; plt 273  TODAY:   07-10-17: hgb a1c 8.8    Review of Systems  Constitutional: Negative for malaise/fatigue.  Respiratory: Negative for cough and shortness of breath.   Cardiovascular: Negative for chest pain, palpitations and leg swelling.  Gastrointestinal: Negative for abdominal pain, constipation and heartburn.  Musculoskeletal: Negative for back pain, joint pain and myalgias.  Skin: Negative.   Neurological: Negative for dizziness.  Psychiatric/Behavioral: The patient is not nervous/anxious.     Physical Exam  Constitutional: He is oriented to person, place, and time. He appears well-developed and well-nourished. No distress.  Obese   Neck: Carotid bruit is present. No thyromegaly present.  Cardiovascular: Normal rate, regular rhythm and intact distal pulses.  Murmur heard. 2/6  Pulmonary/Chest: Effort normal and breath sounds normal. No respiratory distress.  Abdominal: Soft. Bowel sounds are normal. He exhibits no distension. There is no tenderness.  Genitourinary:  Genitourinary Comments: Has foley has penile tear  Musculoskeletal: He exhibits no edema.  Able to move all extremities   Lymphadenopathy:    He has no cervical adenopathy.  Neurological: He is alert and oriented to person, place, and time.  Skin: Skin is warm and dry. He is not diaphoretic.  Lower legs discolored   Psychiatric: He has a normal mood and affect.   ASSESSMENT/ PLAN:  TODAY:   1. Dyslipidemia: stable  ldl 92; will continue zetia 10 mg daily   2. Diabetes: improved hgb a1c 8.8 (previous  hgb a1c 9.8): will continue tuojeo 40 units nightly and humalog 10 units with meals  3.  Allergic rhinitis: stable will continue mucinex 600 mg twice daily and duoneb every 6 hours as needed   4. Bullous pemphigoid: stable will continue methotrexte 10 mg weekly with folic acid 1 mg daily and is on long term prednisone 10 mg daily    PREVIOUS:    5. Restless leg syndrome: is  stable will continue requip 0.5 mg nightly   6. Depression: is stable will continue lexapro 10 mg daily and takes trazodone 50 mg nightly   7. Constipation: stable will continue senna s 2 tabs daily   8. Pruritis: stable will continue atarax 25 mg three times daily   9. 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 Left shoulder pain: has had steroid injection done: will continue ultram 100 mg three times daily with tylenol 1 gm three times daily using voltaren gel to left shoulder three times daily   11. Hypertension: is stable; b/p 128/74: will continue norvasc 5 mg daily lopressor 12.5 mg twice daily   12. Chronic diastolic heart failure: stable; will continue demadex 20 mg  daily   asa 81 mg daily lopressor 12.5 mg twice daily and has ntg 0.4 mg every 5 mins X 3 as needed  13.  GERD: stable will continue prilosec 20 mg daily   14. Diabetic neuropathy: stable hgb a1c 9.8: will continue neurontin 100 mg twice daily  15. Hypokalemia: k+ 3.5; will continue klor con 10 meq tabs 40 meq daily and will monitor  He will decline k+ at times    Will check cbc; cmp    MD is aware of resident's narcotic use and is in agreement with current plan of care. We will attempt to wean resident as apropriate     Ok Edwards NP [facility]  Contact [phone] Monday through Friday 8am- 5pm  After hours call [phone]

## 2017-08-09 DIAGNOSIS — I10 Essential (primary) hypertension: Secondary | ICD-10-CM | POA: Diagnosis not present

## 2017-08-09 DIAGNOSIS — S46012S Strain of muscle(s) and tendon(s) of the rotator cuff of left shoulder, sequela: Secondary | ICD-10-CM | POA: Diagnosis not present

## 2017-08-09 DIAGNOSIS — M6281 Muscle weakness (generalized): Secondary | ICD-10-CM | POA: Diagnosis not present

## 2017-08-09 LAB — BASIC METABOLIC PANEL
BUN: 30 — AB (ref 4–21)
Creatinine: 0.8 (ref 0.6–1.3)
GLUCOSE: 338
POTASSIUM: 4.8 (ref 3.4–5.3)
SODIUM: 134 — AB (ref 137–147)

## 2017-08-09 LAB — HEPATIC FUNCTION PANEL
ALK PHOS: 92 (ref 25–125)
ALT: 14 (ref 10–40)
AST: 11 — AB (ref 14–40)
BILIRUBIN, TOTAL: 0.2

## 2017-08-09 LAB — CBC AND DIFFERENTIAL
HEMATOCRIT: 37 — AB (ref 41–53)
Hemoglobin: 12.4 — AB (ref 13.5–17.5)
Neutrophils Absolute: 7
Platelets: 203 (ref 150–399)
WBC: 9.6

## 2017-08-14 ENCOUNTER — Encounter: Payer: Self-pay | Admitting: Adult Health

## 2017-08-14 ENCOUNTER — Non-Acute Institutional Stay (SKILLED_NURSING_FACILITY): Payer: Medicare Other | Admitting: Adult Health

## 2017-08-14 DIAGNOSIS — IMO0002 Reserved for concepts with insufficient information to code with codable children: Secondary | ICD-10-CM

## 2017-08-14 DIAGNOSIS — E1165 Type 2 diabetes mellitus with hyperglycemia: Secondary | ICD-10-CM | POA: Diagnosis not present

## 2017-08-14 DIAGNOSIS — E1143 Type 2 diabetes mellitus with diabetic autonomic (poly)neuropathy: Secondary | ICD-10-CM | POA: Diagnosis not present

## 2017-08-14 NOTE — Progress Notes (Signed)
Location:   [place] Room Number: 216 B Place of Service:  SNF (31)   CODE STATUS: Full Code  Allergies  Allergen Reactions  . Ace Inhibitors Swelling    Pt tolerates lisinopril  . Lipitor [Atorvastatin Calcium] Swelling  . Metformin And Related Swelling  . Morphine And Related Other (See Comments)    Sweating, feels like is "in rocky boat."  . Robaxin [Methocarbamol] Other (See Comments)    Feels like he is shaky    Chief Complaint  Patient presents with  . Acute Visit    Diabetes Mellitus    HPI:  His cbg's remain elevated with nearly all of them over 200. We did discuss his medication regimen. We did discuss the dangers of his continued elevated cbg readings and he has verbalized understanding. We did discuss the possibility of starting tradjenta. He is wanting to think about this medication before starting it. He is willing to increase his insulin dosing at this time. He denies any excessive hunger or excessive thirst. He does not have increased urine output.    Past Medical History:  Diagnosis Date  . Arthritis    "hands, ankles, knees" (11/10/2014)  . Bladder spasms   . CAD (coronary artery disease)   . CAD in native artery 09/15/2015  . CHF (congestive heart failure) ([place])   . Chronic indwelling Foley catheter   . Chronic lower back pain   . Depression   . Diabetic diarrhea ([place])   . Diabetic neuropathy, painful ([place])   . DVT (deep venous thrombosis) ([facility]) 1980's   LLE  . GERD (gastroesophageal reflux disease)   . Hyperlipidemia   . Hypertension   . Neurogenic bladder   . Obese   . OSA (obstructive sleep apnea)    "they wanted me to wear a mask; I couldn't" (11/10/2014)  . Pneumonia 1999  . PONV (postoperative nausea and vomiting)   . Stroke [facility]) 10/2011; 06/2014   right hand numbness/notes 10/20/2011, pt not sure he had stroke in 2013; "right hand weaker and right face not quite right since" (11/10/2014)  . Type II diabetes mellitus ([place])   .  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 CARDIAC CATHETERIZATION N/A 05/02/2015   Procedure: Left Heart Cath and Coronary Angiography;  Surgeon: Lorretta Harp, MD;  Location: [place] CV LAB;  Service: Cardiovascular;  Laterality: N/A;  . CARDIAC CATHETERIZATION N/A 05/04/2015   Procedure: Left Heart Cath and Coronary Angiography;  Surgeon: Wellington Hampshire, MD;  Location: [place] CV LAB;  Service: Cardiovascular;  Laterality: N/A;  . CARPAL TUNNEL RELEASE Bilateral ~ 2003-2004  . CHOLECYSTECTOMY OPEN  1970's?  Marland Kitchen GASTROPLASTY    . JOINT REPLACEMENT    . LEFT HEART CATHETERIZATION WITH CORONARY ANGIOGRAM N/A 10/24/2011   Procedure: LEFT HEART CATHETERIZATION WITH CORONARY ANGIOGRAM;  Surgeon: Candee Furbish, MD;  Location: [facility] CATH LAB;  Service: Cardiovascular;  Laterality: N/A;  right radial artery approach  . TOTAL KNEE ARTHROPLASTY Right 1990's    Social History   Socioeconomic History  . Marital status: Widowed    Spouse name: Not on file  . Number of children: Not on file  . Years of education: Not on file  . Highest education level: Not on file  Social Needs  . Financial resource strain: Not on file  . Food insecurity - worry: Not on file  . Food insecurity - inability: Not  on file  . Transportation needs - medical: Not on file  . Transportation needs - non-medical: Not on file  Occupational History  . Occupation: Retired  Tobacco Use  . Smoking status: Former Smoker    Packs/day: 2.00    Years: 20.00    Pack years: 40.00    Types: Cigarettes    Last attempt to quit: 09/10/1976    Years since quitting: 40.9  . Smokeless tobacco: Never Used  Substance and Sexual Activity  . Alcohol use: No    Alcohol/week: 0.0 oz    Comment: FORMER ALCOHOLIC; "sober since 53/66/4403"  . Drug use: No  . Sexual activity: No  Other Topics Concern  . Not on file  Social History  Narrative  . Not on file   Family History  Problem Relation Age of Onset  . Diabetes type I Father   . Hypertension Mother   . Cancer Brother         Testicular Cancer  . Cancer Brother        Leukemia      VITAL SIGNS Ht 6\' 2"  (1.88 m)   Wt (!) 348 lb 3.2 oz (157.9 kg)   BMI 44.71 kg/m   Outpatient Encounter Medications as of 08/14/2017  Medication Sig  . acetaminophen (TYLENOL) 500 MG tablet Take 1,000 mg by mouth 3 (three) times daily.  Marland Kitchen aluminum-magnesium hydroxide-simethicone (MAALOX) 474-259-56 MG/5ML SUSP Take 30 mLs by mouth every 6 (six) hours as needed.  Marland Kitchen amLODipine (NORVASC) 5 MG tablet Take 5 mg by mouth daily.  Marland Kitchen aspirin EC 81 MG tablet Take 81 mg by mouth daily. 0900  . diclofenac sodium (VOLTAREN) 1 % GEL Apply topically 3 (three) times daily. Apply to left shoulder topically three times daily   . escitalopram (LEXAPRO) 10 MG tablet Take 10 mg by mouth daily.   Marland Kitchen ezetimibe (ZETIA) 10 MG tablet Take 10 mg by mouth daily.  . folic acid (FOLVITE) 1 MG tablet Take 1 mg by mouth daily.  Marland Kitchen gabapentin (NEURONTIN) 100 MG capsule Take 1 capsule (100 mg total) by mouth 2 (two) times daily.  Marland Kitchen guaiFENesin (MUCINEX) 600 MG 12 hr tablet Take 600 mg by mouth 2 (two) times daily.  . hydrOXYzine (ATARAX/VISTARIL) 25 MG tablet Take 25 mg by mouth 3 (three) times daily.   . Insulin Glargine (TOUJEO SOLOSTAR) 300 UNIT/ML SOPN Inject 40 Units into the skin at bedtime.   . insulin lispro (HUMALOG) 100 UNIT/ML injection Inject as per sliding scale subcutaneously with meals.  Pt may self administer 0-70 = 0 units 71-450 = 10 units 451-600 Notify MD  . ipratropium-albuterol (DUONEB) 0.5-2.5 (3) MG/3ML SOLN Take 3 mLs by nebulization every 6 (six) hours as needed (sob).   . methotrexate (RHEUMATREX) 10 MG tablet Take 10 mg by mouth every Wednesday. Caution: Chemotherapy. Protect from light.  . metoprolol tartrate (LOPRESSOR) 25 MG tablet Take 12.5 mg by mouth 2 (two) times daily.   .  Multiple Vitamins-Minerals (DECUBI-VITE) CAPS Take 1 capsule by mouth daily at 3 pm.  . nitroGLYCERIN (NITROSTAT) 0.4 MG SL tablet Place 0.4 mg under the tongue every 5 (five) minutes as needed for chest pain.  Marland Kitchen omeprazole (PRILOSEC) 20 MG capsule Take 20 mg by mouth daily.  . OXYGEN Inhale into the lungs. O2 at 2l/min via nasal canula as needed for shortness of breath  . potassium chloride (K-DUR) 10 MEQ tablet Give 4 tablets by mouth daily  . predniSONE (DELTASONE) 10 MG tablet Take 10  mg by mouth daily.  Marland Kitchen rOPINIRole (REQUIP) 0.5 MG tablet Take 0.5 mg by mouth at bedtime. 2100  . sennosides-docusate sodium (SENOKOT-S) 8.6-50 MG tablet Take 2 tablets by mouth at bedtime. 2100  . torsemide (DEMADEX) 20 MG tablet Take 20 mg by mouth daily.   . traMADol (ULTRAM) 50 MG tablet Take 2 tablets (100 mg total) by mouth 3 (three) times daily. Hold if Lethargic or confused  . traZODone (DESYREL) 50 MG tablet Take 50 mg by mouth at bedtime.    No facility-administered encounter medications on file as of 08/14/2017.      SIGNIFICANT DIAGNOSTIC EXAMS  PREVIOUS  10-18-15: EEG: This is a normal EEG for the patients stated age.  There were no focal, hemispheric or lateralizing features.  No epileptiform activity was recorded.  A normal EEG does not exclude the diagnosis of a seizure disorder and if seizure remains high on the list of differential diagnosis, an ambulatory EEG may be of value.  Clinical correlation is required.  01-11-16: ct of head: No acute intracranial abnormality. Stable mild cerebral atrophy and chronic white matter disease. No definite acute cortical infarction.  01-11-16: scrotal ultrasound: 1. Left epididymo-orchitis. 2. Microlithiasis involving the left testicle. 3. Three adjacent complex cysts/spermatoceles involving the left epididymal head.  05-27-17: ct of abdomen and pelvis  1. Negative for hydronephrosis. Nonspecific perinephric fat stranding. Foley catheter is present in the  bladder which is empty, the tip of the catheter appears to tent the superior wall of the bladder. 2. There are no acute intra-abdominal or pelvic abnormalities seen. 3. 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 Bilateral hip joint degenerative changes, right greater than left but no hip fracture, AVN or findings to suggest septic arthritis. 2. Significant artifact from the patient's skin touching the gantry. Suspect mild cellulitis but no obvious abscess, myofasciitis or Pyomyositis.  05-31-17: 2-d echo:   Left ventricle: The cavity size was normal. Wall thickness was increased in a pattern of mild LVH. Systolic function was normal. The estimated ejection fraction was in the range of 55% to 60%. Doppler parameters are consistent with abnormal left ventricular relaxation (grade 1 diastolic dysfunction). - Aortic valve: There was mild stenosis. There was trivial regurgitation.  - Left atrium: The atrium was moderately dilated. - Atrial septum: No defect or patent foramen ovale was identified  NO NEW EXAMS .  LABS REVIEWED: PREVIOUS     12-13-16: urine micro-albumin 54.44 01-29-17: wbc 15.0; hgb 12.6; hct 38.1; mcv 93.0; plt 255; glucose 418; bun 29.1 creat 1.03; k+ 4.1; n++ 136; ca 8.6; liver normal albumin 3.3; hgb a1c 9.1  05-08-17: hgb a1c 9.8; chol 190; ldl 92; trig 299; hdl 38  05-27-17: wbc 17.5; hgb 11.8; hct 35.8; mcv 90.2; plt 271; glucose 222; bun 16; creat 0.95; k+ 3.6; na++ 135; ca 8.4; CRP 17.1; sed rate 48; blood culture: no growth; urine culture: e-coli: esbl 05-28-17: wbc 13.3; hgb 11.5; hct 34.9; mcv 88.4; plt 252; glucose 208; bun 14; creat 0.87; k+ 3.2; na++ 135; ca 8.1 AM cortisol 9.5 05-30-17: wbc 7.9; hgb 11.4; hct 35.0; mcv 88.6; plt 279; glucose 175; bun 7; creat 0.74; k+ 3.5; na++ 134; ca 8.2  06-02-17: folate 19.0; vit B12: 357; ferritin 123; iron 45; TIBC 227 06-04-17: wbc 10.0; hgb 10.6; hct 33.3; mcv 91.0; plt 273 07-10-17: hgb a1c 8.8  NO  NEW LABS   Review of Systems  Constitutional: Negative for malaise/fatigue.  Respiratory: Negative  for cough and shortness of breath.   Cardiovascular: Negative for chest pain, palpitations and leg swelling.  Gastrointestinal: Negative for abdominal pain, constipation and heartburn.  Musculoskeletal: Negative for back pain, joint pain and myalgias.  Skin: Negative.   Neurological: Negative for dizziness.  Psychiatric/Behavioral: The patient is not nervous/anxious.        Physical Exam  Constitutional: He is oriented to person, place, and time. He appears well-developed. No distress.  Obese   Neck: Neck supple. Carotid bruit is present. No thyromegaly present.  Cardiovascular: Normal rate, regular rhythm and intact distal pulses.  Murmur heard. Pulmonary/Chest: Effort normal and breath sounds normal. No respiratory distress.  Abdominal: Soft. Bowel sounds are normal. He exhibits no distension. There is no tenderness.  Genitourinary:  Genitourinary Comments: Foley has penile tear   Musculoskeletal: He exhibits no edema.  Is able to move all extremities   Lymphadenopathy:    He has no cervical adenopathy.  Neurological: He is alert and oriented to person, place, and time.  Skin: Skin is warm and dry. He is not diaphoretic.  Lower extremities discolored   Psychiatric: He has a normal mood and affect.      ASSESSMENT/ PLAN:  TODAY:   1 uncontrolled type II diabetes with peripheral autonomic neuropathy: unchanged:  hgb a1c 8.8 (previous  hgb a1c 9.8): will increase to toujeo to 42 units and humalog to 12 units after meals.           MD is aware of resident's narcotic use and is in agreement with current plan of care. We will attempt to wean resident as apropriate   Ok Edwards NP [facility]  Contact [phone] Monday through Friday 8am- 5pm  After hours call [phone]

## 2017-08-15 ENCOUNTER — Encounter: Payer: Self-pay | Admitting: Adult Health

## 2017-08-19 DIAGNOSIS — J309 Allergic rhinitis, unspecified: Secondary | ICD-10-CM | POA: Insufficient documentation

## 2017-08-27 ENCOUNTER — Encounter: Payer: Self-pay | Admitting: Adult Health

## 2017-08-27 ENCOUNTER — Non-Acute Institutional Stay (SKILLED_NURSING_FACILITY): Payer: Medicare Other | Admitting: Adult Health

## 2017-08-27 DIAGNOSIS — E1143 Type 2 diabetes mellitus with diabetic autonomic (poly)neuropathy: Secondary | ICD-10-CM | POA: Diagnosis not present

## 2017-08-27 DIAGNOSIS — M65331 Trigger finger, right middle finger: Secondary | ICD-10-CM

## 2017-08-27 DIAGNOSIS — IMO0002 Reserved for concepts with insufficient information to code with codable children: Secondary | ICD-10-CM

## 2017-08-27 DIAGNOSIS — L0292 Furuncle, unspecified: Secondary | ICD-10-CM

## 2017-08-27 DIAGNOSIS — E1165 Type 2 diabetes mellitus with hyperglycemia: Secondary | ICD-10-CM | POA: Diagnosis not present

## 2017-08-27 NOTE — Progress Notes (Signed)
Location:   [place] Room Number: 216 B Place of Service:  SNF (31)   CODE STATUS: Full Code  Allergies  Allergen Reactions  . Ace Inhibitors Swelling    Pt tolerates lisinopril  . Lipitor [Atorvastatin Calcium] Swelling  . Metformin And Related Swelling  . Morphine And Related Other (See Comments)    Sweating, feels like is "in rocky boat."  . Robaxin [Methocarbamol] Other (See Comments)    Feels like he is shaky    Chief Complaint  Patient presents with  . Acute Visit    Diabetes Mellitus    HPI:  His cbgs are slowly improving. Some of them are elevated but more are under 200. He is complaining of a left shoulder cyst which is painful to touch. He is also complaining of a right trigger finger. There are no reports of changes in appetite; no reports of excessive thirst; or urine output. There are no reports of fever present.    Past Medical History:  Diagnosis Date  . Arthritis    "hands, ankles, knees" (11/10/2014)  . Bladder spasms   . CAD (coronary artery disease)   . CAD in native artery 09/15/2015  . CHF (congestive heart failure) ([place])   . Chronic indwelling Foley catheter   . Chronic lower back pain   . Depression   . Diabetic diarrhea ([place])   . Diabetic neuropathy, painful ([place])   . DVT (deep venous thrombosis) ([facility]) 1980's   LLE  . GERD (gastroesophageal reflux disease)   . Hyperlipidemia   . Hypertension   . Neurogenic bladder   . Obese   . OSA (obstructive sleep apnea)    "they wanted me to wear a mask; I couldn't" (11/10/2014)  . Pneumonia 1999  . PONV (postoperative nausea and vomiting)   . Stroke [facility]) 10/2011; 06/2014   right hand numbness/notes 10/20/2011, pt not sure he had stroke in 2013; "right hand weaker and right face not quite right since" (11/10/2014)  . Type II diabetes mellitus ([place])   . 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  CARDIAC CATHETERIZATION N/A 05/02/2015   Procedure: Left Heart Cath and Coronary Angiography;  Surgeon: Lorretta Harp, MD;  Location: [place] CV LAB;  Service: Cardiovascular;  Laterality: N/A;  . CARDIAC CATHETERIZATION N/A 05/04/2015   Procedure: Left Heart Cath and Coronary Angiography;  Surgeon: Wellington Hampshire, MD;  Location: [place] CV LAB;  Service: Cardiovascular;  Laterality: N/A;  . CARPAL TUNNEL RELEASE Bilateral ~ 2003-2004  . CHOLECYSTECTOMY OPEN  1970's?  Marland Kitchen GASTROPLASTY    . JOINT REPLACEMENT    . LEFT HEART CATHETERIZATION WITH CORONARY ANGIOGRAM N/A 10/24/2011   Procedure: LEFT HEART CATHETERIZATION WITH CORONARY ANGIOGRAM;  Surgeon: Candee Furbish, MD;  Location: [facility] CATH LAB;  Service: Cardiovascular;  Laterality: N/A;  right radial artery approach  . TOTAL KNEE ARTHROPLASTY Right 1990's    Social History   Socioeconomic History  . Marital status: Widowed    Spouse name: Not on file  . Number of children: Not on file  . Years of education: Not on file  . Highest education level: Not on file  Social Needs  . Financial resource strain: Not on file  . Food insecurity - worry: Not on file  . Food insecurity - inability: Not on file  . Transportation needs - medical: Not on file  . Transportation needs - non-medical: Not  on file  Occupational History  . Occupation: Retired  Tobacco Use  . Smoking status: Former Smoker    Packs/day: 2.00    Years: 20.00    Pack years: 40.00    Types: Cigarettes    Last attempt to quit: 09/10/1976    Years since quitting: 40.9  . Smokeless tobacco: Never Used  Substance and Sexual Activity  . Alcohol use: No    Alcohol/week: 0.0 oz    Comment: FORMER ALCOHOLIC; "sober since 47/05/6282"  . Drug use: No  . Sexual activity: No  Other Topics Concern  . Not on file  Social History Narrative  . Not on file   Family History  Problem Relation Age of Onset  . Diabetes type I Father   . Hypertension Mother   . Cancer Brother           Testicular Cancer  . Cancer Brother        Leukemia      VITAL SIGNS BP 130/72   Pulse 74   Temp 98.4 F (36.9 C)   Resp 20   Ht 6\' 2"  (1.88 m)   Wt (!) 348 lb 3.2 oz (157.9 kg)   SpO2 97%   BMI 44.71 kg/m   Outpatient Encounter Medications as of 08/27/2017  Medication Sig  . acetaminophen (TYLENOL) 500 MG tablet Take 1,000 mg by mouth 3 (three) times daily.  Marland Kitchen aluminum-magnesium hydroxide-simethicone (MAALOX) 662-947-65 MG/5ML SUSP Take 30 mLs by mouth every 6 (six) hours as needed.  Marland Kitchen amLODipine (NORVASC) 5 MG tablet Take 5 mg by mouth daily.  Marland Kitchen aspirin EC 81 MG tablet Take 81 mg by mouth daily. 0900  . diclofenac sodium (VOLTAREN) 1 % GEL Apply topically 3 (three) times daily. Apply to left shoulder topically three times daily   . escitalopram (LEXAPRO) 10 MG tablet Take 10 mg by mouth daily.   Marland Kitchen ezetimibe (ZETIA) 10 MG tablet Take 10 mg by mouth daily.  . folic acid (FOLVITE) 1 MG tablet Take 1 mg by mouth daily.  Marland Kitchen gabapentin (NEURONTIN) 100 MG capsule Take 1 capsule (100 mg total) by mouth 2 (two) times daily.  Marland Kitchen guaiFENesin (MUCINEX) 600 MG 12 hr tablet Take 600 mg by mouth 2 (two) times daily.  . hydrOXYzine (ATARAX/VISTARIL) 25 MG tablet Take 25 mg by mouth 3 (three) times daily.   . Insulin Glargine (TOUJEO SOLOSTAR) 300 UNIT/ML SOPN Inject 42 Units into the skin at bedtime.   . insulin lispro (HUMALOG) 100 UNIT/ML injection Inject as per sliding scale subcutaneously with meals.  Pt may self administer 0-70 = 0 units 71-450 = 12 units 451-600 Notify MD  . ipratropium-albuterol (DUONEB) 0.5-2.5 (3) MG/3ML SOLN Take 3 mLs by nebulization every 6 (six) hours as needed (sob).   . methotrexate (RHEUMATREX) 10 MG tablet Take 10 mg by mouth every Wednesday. Caution: Chemotherapy. Protect from light.  . metoprolol tartrate (LOPRESSOR) 25 MG tablet Take 12.5 mg by mouth 2 (two) times daily.   . Multiple Vitamins-Minerals (DECUBI-VITE) CAPS Take 1 capsule by mouth  daily at 3 pm.  . nitroGLYCERIN (NITROSTAT) 0.4 MG SL tablet Place 0.4 mg under the tongue every 5 (five) minutes as needed for chest pain.  Marland Kitchen omeprazole (PRILOSEC) 20 MG capsule Take 20 mg by mouth daily.  . OXYGEN Inhale into the lungs. O2 at 2l/min via nasal canula as needed for shortness of breath  . potassium chloride (K-DUR) 10 MEQ tablet Give 10 mEq by mouth four times a day  .  predniSONE (DELTASONE) 10 MG tablet Take 10 mg by mouth daily.  Marland Kitchen rOPINIRole (REQUIP) 0.5 MG tablet Take 0.5 mg by mouth at bedtime. 2100  . sennosides-docusate sodium (SENOKOT-S) 8.6-50 MG tablet Take 2 tablets by mouth at bedtime. 2100  . torsemide (DEMADEX) 20 MG tablet Take 20 mg by mouth daily.   . traMADol (ULTRAM) 50 MG tablet Take 2 tablets (100 mg total) by mouth 3 (three) times daily. Hold if Lethargic or confused  . traZODone (DESYREL) 50 MG tablet Take 50 mg by mouth at bedtime.    No facility-administered encounter medications on file as of 08/27/2017.      SIGNIFICANT DIAGNOSTIC EXAMS  PREVIOUS  10-18-15: EEG: This is a normal EEG for the patients stated age.  There were no focal, hemispheric or lateralizing features.  No epileptiform activity was recorded.  A normal EEG does not exclude the diagnosis of a seizure disorder and if seizure remains high on the list of differential diagnosis, an ambulatory EEG may be of value.  Clinical correlation is required.  01-11-16: ct of head: No acute intracranial abnormality. Stable mild cerebral atrophy and chronic white matter disease. No definite acute cortical infarction.  01-11-16: scrotal ultrasound: 1. Left epididymo-orchitis. 2. Microlithiasis involving the left testicle. 3. Three adjacent complex cysts/spermatoceles involving the left epididymal head.  05-27-17: ct of abdomen and pelvis  1. Negative for hydronephrosis. Nonspecific perinephric fat stranding. Foley catheter is present in the bladder which is empty, the tip of the catheter appears to tent  the superior wall of the bladder. 2. There are no acute intra-abdominal or pelvic abnormalities seen. 3. 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 Bilateral hip joint degenerative changes, right greater than left but no hip fracture, AVN or findings to suggest septic arthritis. 2. Significant artifact from the patient's skin touching the gantry. Suspect mild cellulitis but no obvious abscess, myofasciitis or Pyomyositis.  05-31-17: 2-d echo:   Left ventricle: The cavity size was normal. Wall thickness was increased in a pattern of mild LVH. Systolic function was normal. The estimated ejection fraction was in the range of 55% to 60%. Doppler parameters are consistent with abnormal left ventricular relaxation (grade 1 diastolic dysfunction). - Aortic valve: There was mild stenosis. There was trivial regurgitation.  - Left atrium: The atrium was moderately dilated. - Atrial septum: No defect or patent foramen ovale was identified  NO NEW EXAMS .  LABS REVIEWED: PREVIOUS     12-13-16: urine micro-albumin 54.44 01-29-17: wbc 15.0; hgb 12.6; hct 38.1; mcv 93.0; plt 255; glucose 418; bun 29.1 creat 1.03; k+ 4.1; n++ 136; ca 8.6; liver normal albumin 3.3; hgb a1c 9.1  05-08-17: hgb a1c 9.8; chol 190; ldl 92; trig 299; hdl 38  05-27-17: wbc 17.5; hgb 11.8; hct 35.8; mcv 90.2; plt 271; glucose 222; bun 16; creat 0.95; k+ 3.6; na++ 135; ca 8.4; CRP 17.1; sed rate 48; blood culture: no growth; urine culture: e-coli: esbl 05-28-17: wbc 13.3; hgb 11.5; hct 34.9; mcv 88.4; plt 252; glucose 208; bun 14; creat 0.87; k+ 3.2; na++ 135; ca 8.1 AM cortisol 9.5 05-30-17: wbc 7.9; hgb 11.4; hct 35.0; mcv 88.6; plt 279; glucose 175; bun 7; creat 0.74; k+ 3.5; na++ 134; ca 8.2  06-02-17: folate 19.0; vit B12: 357; ferritin 123; iron 45; TIBC 227 06-04-17: wbc 10.0; hgb 10.6; hct 33.3; mcv 91.0; plt 273 07-10-17: hgb a1c 8.8  NO NEW LABS   Review of Systems  Constitutional:  Negative for  malaise/fatigue.  Respiratory: Negative for cough and shortness of breath.   Cardiovascular: Negative for chest pain, palpitations and leg swelling.  Gastrointestinal: Negative for abdominal pain, constipation and heartburn.  Musculoskeletal: Negative for back pain, joint pain and myalgias.       Has right trigger finger  Skin:       Has cyst on left shoulder which is tender to touch  Neurological: Negative for dizziness.  Psychiatric/Behavioral: The patient is not nervous/anxious.      Physical Exam  Constitutional: He is oriented to person, place, and time. He appears well-developed and well-nourished. No distress.  Obese   Neck: Carotid bruit is present. No thyromegaly present.  Cardiovascular: Normal rate, regular rhythm and intact distal pulses.  Murmur heard. Pulmonary/Chest: Effort normal and breath sounds normal. No respiratory distress.  Abdominal: Soft. Bowel sounds are normal. He exhibits no distension. There is no tenderness.  Musculoskeletal: He exhibits no edema.  Is able to move all extremities  Has right trigger finger   Lymphadenopathy:    He has no cervical adenopathy.  Neurological: He is alert and oriented to person, place, and time.  Skin: Skin is warm and dry. He is not diaphoretic.  Left shoulder cyst is inflamed; painful and hot to touch there is no drainage at this time.  Lower extremities discolored   Psychiatric: He has a normal mood and affect.        ASSESSMENT/ PLAN:  TODAY:   1.  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   2. Left shoulder cyst: will begin keflex 500 mg three times daily for 10 days will monitor  3. Trigger finger: will have OT evaluate and treat as indicated.   Will check hgb a1c             MD is aware of resident's narcotic use and is in agreement with current plan  of care. We will attempt to wean resident as apropriate   Ok Edwards NP [facility] Adult [facility]  Contact [phone] Monday through Friday 8am- 5pm  After hours call [phone]

## 2017-08-28 DIAGNOSIS — E119 Type 2 diabetes mellitus without complications: Secondary | ICD-10-CM | POA: Diagnosis not present

## 2017-08-29 DIAGNOSIS — I2609 Other pulmonary embolism with acute cor pulmonale: Secondary | ICD-10-CM | POA: Diagnosis not present

## 2017-08-29 DIAGNOSIS — M25512 Pain in left shoulder: Secondary | ICD-10-CM | POA: Diagnosis not present

## 2017-08-29 DIAGNOSIS — M6530 Trigger finger, unspecified finger: Secondary | ICD-10-CM | POA: Diagnosis not present

## 2017-08-29 DIAGNOSIS — M6281 Muscle weakness (generalized): Secondary | ICD-10-CM | POA: Diagnosis not present

## 2017-08-29 DIAGNOSIS — R1311 Dysphagia, oral phase: Secondary | ICD-10-CM | POA: Diagnosis not present

## 2017-08-30 DIAGNOSIS — M6281 Muscle weakness (generalized): Secondary | ICD-10-CM | POA: Diagnosis not present

## 2017-08-30 DIAGNOSIS — R1311 Dysphagia, oral phase: Secondary | ICD-10-CM | POA: Diagnosis not present

## 2017-08-30 DIAGNOSIS — M6530 Trigger finger, unspecified finger: Secondary | ICD-10-CM | POA: Diagnosis not present

## 2017-08-30 DIAGNOSIS — I2609 Other pulmonary embolism with acute cor pulmonale: Secondary | ICD-10-CM | POA: Diagnosis not present

## 2017-08-30 DIAGNOSIS — M25512 Pain in left shoulder: Secondary | ICD-10-CM | POA: Diagnosis not present

## 2017-09-02 DIAGNOSIS — M6281 Muscle weakness (generalized): Secondary | ICD-10-CM | POA: Diagnosis not present

## 2017-09-02 DIAGNOSIS — R1311 Dysphagia, oral phase: Secondary | ICD-10-CM | POA: Diagnosis not present

## 2017-09-02 DIAGNOSIS — M6530 Trigger finger, unspecified finger: Secondary | ICD-10-CM | POA: Diagnosis not present

## 2017-09-02 DIAGNOSIS — M25512 Pain in left shoulder: Secondary | ICD-10-CM | POA: Diagnosis not present

## 2017-09-02 DIAGNOSIS — I2609 Other pulmonary embolism with acute cor pulmonale: Secondary | ICD-10-CM | POA: Diagnosis not present

## 2017-09-03 ENCOUNTER — Encounter (HOSPITAL_COMMUNITY): Payer: Self-pay | Admitting: Physician Assistant

## 2017-09-03 ENCOUNTER — Other Ambulatory Visit (HOSPITAL_COMMUNITY): Payer: Medicare Other

## 2017-09-03 ENCOUNTER — Inpatient Hospital Stay (HOSPITAL_COMMUNITY)
Admit: 2017-09-03 | Discharge: 2017-09-03 | Disposition: A | Discharge status: Home / Self Care | Payer: Medicare Other | Attending: Physician Assistant | Admitting: Physician Assistant

## 2017-09-03 ENCOUNTER — Emergency Department (HOSPITAL_COMMUNITY): Payer: Medicare Other

## 2017-09-03 ENCOUNTER — Other Ambulatory Visit: Payer: Self-pay

## 2017-09-03 ENCOUNTER — Inpatient Hospital Stay (HOSPITAL_COMMUNITY)
Admission: EM | Admit: 2017-09-03 | Discharge: 2017-09-08 | DRG: 175 | Disposition: A | Discharge status: Skilled Nursing Facility | Payer: Medicare Other | Attending: Internal Medicine | Admitting: Internal Medicine

## 2017-09-03 ENCOUNTER — Inpatient Hospital Stay (HOSPITAL_COMMUNITY): Payer: Medicare Other

## 2017-09-03 DIAGNOSIS — Z9289 Personal history of other medical treatment: Secondary | ICD-10-CM | POA: Diagnosis not present

## 2017-09-03 DIAGNOSIS — L109 Pemphigus, unspecified: Secondary | ICD-10-CM | POA: Diagnosis present

## 2017-09-03 DIAGNOSIS — N183 Chronic kidney disease, stage 3 (moderate): Secondary | ICD-10-CM | POA: Diagnosis present

## 2017-09-03 DIAGNOSIS — E43 Unspecified severe protein-calorie malnutrition: Secondary | ICD-10-CM | POA: Diagnosis present

## 2017-09-03 DIAGNOSIS — IMO0002 Reserved for concepts with insufficient information to code with codable children: Secondary | ICD-10-CM | POA: Diagnosis present

## 2017-09-03 DIAGNOSIS — I110 Hypertensive heart disease with heart failure: Secondary | ICD-10-CM

## 2017-09-03 DIAGNOSIS — J9621 Acute and chronic respiratory failure with hypoxia: Secondary | ICD-10-CM | POA: Diagnosis present

## 2017-09-03 DIAGNOSIS — I744 Embolism and thrombosis of arteries of extremities, unspecified: Secondary | ICD-10-CM | POA: Diagnosis not present

## 2017-09-03 DIAGNOSIS — Z79899 Other long term (current) drug therapy: Secondary | ICD-10-CM

## 2017-09-03 DIAGNOSIS — I2510 Atherosclerotic heart disease of native coronary artery without angina pectoris: Secondary | ICD-10-CM | POA: Diagnosis not present

## 2017-09-03 DIAGNOSIS — N319 Neuromuscular dysfunction of bladder, unspecified: Secondary | ICD-10-CM | POA: Diagnosis present

## 2017-09-03 DIAGNOSIS — F32A Depression, unspecified: Secondary | ICD-10-CM | POA: Diagnosis present

## 2017-09-03 DIAGNOSIS — E1122 Type 2 diabetes mellitus with diabetic chronic kidney disease: Secondary | ICD-10-CM | POA: Diagnosis present

## 2017-09-03 DIAGNOSIS — E669 Obesity, unspecified: Secondary | ICD-10-CM | POA: Diagnosis not present

## 2017-09-03 DIAGNOSIS — R079 Chest pain, unspecified: Secondary | ICD-10-CM | POA: Diagnosis not present

## 2017-09-03 DIAGNOSIS — Z8249 Family history of ischemic heart disease and other diseases of the circulatory system: Secondary | ICD-10-CM

## 2017-09-03 DIAGNOSIS — R0602 Shortness of breath: Secondary | ICD-10-CM

## 2017-09-03 DIAGNOSIS — N179 Acute kidney failure, unspecified: Secondary | ICD-10-CM | POA: Diagnosis present

## 2017-09-03 DIAGNOSIS — I130 Hypertensive heart and chronic kidney disease with heart failure and stage 1 through stage 4 chronic kidney disease, or unspecified chronic kidney disease: Secondary | ICD-10-CM | POA: Diagnosis present

## 2017-09-03 DIAGNOSIS — F329 Major depressive disorder, single episode, unspecified: Secondary | ICD-10-CM | POA: Diagnosis present

## 2017-09-03 DIAGNOSIS — Z960 Presence of urogenital implants: Secondary | ICD-10-CM

## 2017-09-03 DIAGNOSIS — G4733 Obstructive sleep apnea (adult) (pediatric): Secondary | ICD-10-CM | POA: Diagnosis present

## 2017-09-03 DIAGNOSIS — Z87891 Personal history of nicotine dependence: Secondary | ICD-10-CM

## 2017-09-03 DIAGNOSIS — E1143 Type 2 diabetes mellitus with diabetic autonomic (poly)neuropathy: Secondary | ICD-10-CM | POA: Diagnosis present

## 2017-09-03 DIAGNOSIS — R0789 Other chest pain: Secondary | ICD-10-CM | POA: Diagnosis not present

## 2017-09-03 DIAGNOSIS — Z96651 Presence of right artificial knee joint: Secondary | ICD-10-CM | POA: Diagnosis present

## 2017-09-03 DIAGNOSIS — G2581 Restless legs syndrome: Secondary | ICD-10-CM | POA: Diagnosis present

## 2017-09-03 DIAGNOSIS — E1169 Type 2 diabetes mellitus with other specified complication: Secondary | ICD-10-CM | POA: Diagnosis present

## 2017-09-03 DIAGNOSIS — I5032 Chronic diastolic (congestive) heart failure: Secondary | ICD-10-CM | POA: Diagnosis not present

## 2017-09-03 DIAGNOSIS — N390 Urinary tract infection, site not specified: Secondary | ICD-10-CM | POA: Diagnosis present

## 2017-09-03 DIAGNOSIS — Z978 Presence of other specified devices: Secondary | ICD-10-CM

## 2017-09-03 DIAGNOSIS — J209 Acute bronchitis, unspecified: Secondary | ICD-10-CM | POA: Diagnosis present

## 2017-09-03 DIAGNOSIS — T83511A Infection and inflammatory reaction due to indwelling urethral catheter, initial encounter: Secondary | ICD-10-CM

## 2017-09-03 DIAGNOSIS — Z8673 Personal history of transient ischemic attack (TIA), and cerebral infarction without residual deficits: Secondary | ICD-10-CM

## 2017-09-03 DIAGNOSIS — I2699 Other pulmonary embolism without acute cor pulmonale: Secondary | ICD-10-CM | POA: Diagnosis not present

## 2017-09-03 DIAGNOSIS — I82401 Acute embolism and thrombosis of unspecified deep veins of right lower extremity: Secondary | ICD-10-CM | POA: Diagnosis not present

## 2017-09-03 DIAGNOSIS — I82439 Acute embolism and thrombosis of unspecified popliteal vein: Secondary | ICD-10-CM | POA: Diagnosis present

## 2017-09-03 DIAGNOSIS — L8990 Pressure ulcer of unspecified site, unspecified stage: Secondary | ICD-10-CM

## 2017-09-03 DIAGNOSIS — Z6841 Body Mass Index (BMI) 40.0 and over, adult: Secondary | ICD-10-CM

## 2017-09-03 DIAGNOSIS — Z7982 Long term (current) use of aspirin: Secondary | ICD-10-CM

## 2017-09-03 DIAGNOSIS — E785 Hyperlipidemia, unspecified: Secondary | ICD-10-CM | POA: Diagnosis not present

## 2017-09-03 DIAGNOSIS — E875 Hyperkalemia: Secondary | ICD-10-CM

## 2017-09-03 DIAGNOSIS — E1165 Type 2 diabetes mellitus with hyperglycemia: Secondary | ICD-10-CM | POA: Diagnosis present

## 2017-09-03 DIAGNOSIS — D649 Anemia, unspecified: Secondary | ICD-10-CM | POA: Diagnosis present

## 2017-09-03 DIAGNOSIS — E1140 Type 2 diabetes mellitus with diabetic neuropathy, unspecified: Secondary | ICD-10-CM | POA: Diagnosis present

## 2017-09-03 DIAGNOSIS — N189 Chronic kidney disease, unspecified: Secondary | ICD-10-CM | POA: Diagnosis not present

## 2017-09-03 DIAGNOSIS — Z888 Allergy status to other drugs, medicaments and biological substances status: Secondary | ICD-10-CM

## 2017-09-03 DIAGNOSIS — K219 Gastro-esophageal reflux disease without esophagitis: Secondary | ICD-10-CM | POA: Diagnosis present

## 2017-09-03 DIAGNOSIS — F419 Anxiety disorder, unspecified: Secondary | ICD-10-CM | POA: Diagnosis present

## 2017-09-03 DIAGNOSIS — Z885 Allergy status to narcotic agent status: Secondary | ICD-10-CM

## 2017-09-03 LAB — TROPONIN I
TROPONIN I: 0.03 ng/mL — AB (ref <0.03)
TROPONIN I: 0.03 ng/mL — AB (ref <0.03)
Troponin I: <0.03 ng/mL (ref <0.03)

## 2017-09-03 LAB — POTASSIUM: POTASSIUM: 6 mmol/L — AB (ref 3.5–5.1)

## 2017-09-03 LAB — CBC
HEMATOCRIT: 39.1 % (ref 39.0–52.0)
Hemoglobin: 13.1 g/dL (ref 13.0–17.0)
MCH: 30 pg (ref 26.0–34.0)
MCHC: 33.5 g/dL (ref 30.0–36.0)
MCV: 89.7 fL (ref 78.0–100.0)
PLATELETS: 266 10*3/uL (ref 150–400)
RBC: 4.36 MIL/uL (ref 4.22–5.81)
RDW: 15.7 % — AB (ref 11.5–15.5)
WBC: 11.8 10*3/uL — AB (ref 4.0–10.5)

## 2017-09-03 LAB — GLUCOSE, CAPILLARY
GLUCOSE-CAPILLARY: 371 mg/dL — AB (ref 65–99)
GLUCOSE-CAPILLARY: 393 mg/dL — AB (ref 65–99)
Glucose-Capillary: 356 mg/dL — ABNORMAL HIGH (ref 65–99)
Glucose-Capillary: 293 mg/dL — ABNORMAL HIGH (ref 65–99)

## 2017-09-03 LAB — BRAIN NATRIURETIC PEPTIDE: B Natriuretic Peptide: 100.7 pg/mL — ABNORMAL HIGH (ref 0.0–100.0)

## 2017-09-03 LAB — BASIC METABOLIC PANEL
Anion gap: 11 (ref 5–15)
BUN: 23 mg/dL — AB (ref 6–20)
CALCIUM: 8.5 mg/dL — AB (ref 8.9–10.3)
CO2: 21 mmol/L — ABNORMAL LOW (ref 22–32)
CREATININE: 2.03 mg/dL — AB (ref 0.61–1.24)
Chloride: 99 mmol/L — ABNORMAL LOW (ref 101–111)
GFR calc Af Amer: 37 mL/min — ABNORMAL LOW (ref >60)
GFR, EST NON AFRICAN AMERICAN: 32 mL/min — AB (ref >60)
Glucose, Bld: 307 mg/dL — ABNORMAL HIGH (ref 65–99)
POTASSIUM: 6.5 mmol/L — AB (ref 3.5–5.1)
SODIUM: 131 mmol/L — AB (ref 135–145)

## 2017-09-03 LAB — HEMOGLOBIN A1C
HEMOGLOBIN A1C: 10.3 % — AB (ref 4.8–5.6)
Mean Plasma Glucose: 248.91 mg/dL

## 2017-09-03 LAB — MRSA PCR SCREENING: MRSA by PCR: POSITIVE — AB

## 2017-09-03 LAB — MAGNESIUM: MAGNESIUM: 1.6 mg/dL — AB (ref 1.7–2.4)

## 2017-09-03 MED ORDER — ALPRAZOLAM 0.25 MG PO TABS
0.2500 mg | ORAL_TABLET | Freq: Two times a day (BID) | ORAL | Status: DC | PRN
  Administered 2017-09-06: 0.25 mg via ORAL
  Filled 2017-09-03: qty 1

## 2017-09-03 MED ORDER — ASPIRIN 81 MG PO CHEW
324.0000 mg | CHEWABLE_TABLET | Freq: Once | ORAL | Status: AC
  Administered 2017-09-03: 324 mg via ORAL
  Filled 2017-09-03: qty 4

## 2017-09-03 MED ORDER — SODIUM POLYSTYRENE SULFONATE 15 GM/60ML PO SUSP
30.0000 g | Freq: Once | ORAL | Status: DC
  Filled 2017-09-03: qty 120

## 2017-09-03 MED ORDER — METOPROLOL TARTRATE 12.5 MG HALF TABLET
12.5000 mg | ORAL_TABLET | Freq: Two times a day (BID) | ORAL | Status: DC
  Administered 2017-09-03 – 2017-09-08 (×11): 12.5 mg via ORAL
  Filled 2017-09-03 (×11): qty 1

## 2017-09-03 MED ORDER — SODIUM CHLORIDE 0.9 % IV SOLN: INTRAVENOUS | Status: DC

## 2017-09-03 MED ORDER — INSULIN ASPART 100 UNIT/ML ~~LOC~~ SOLN
0.0000 [IU] | Freq: Three times a day (TID) | SUBCUTANEOUS | Status: DC
  Administered 2017-09-03: 15 [IU] via SUBCUTANEOUS
  Administered 2017-09-03: 8 [IU] via SUBCUTANEOUS
  Administered 2017-09-04: 5 [IU] via SUBCUTANEOUS
  Administered 2017-09-04: 8 [IU] via SUBCUTANEOUS

## 2017-09-03 MED ORDER — SODIUM CHLORIDE 0.9 % IV BOLUS (SEPSIS)
500.0000 mL | Freq: Once | INTRAVENOUS | Status: AC
  Administered 2017-09-03: 500 mL via INTRAVENOUS

## 2017-09-03 MED ORDER — GUAIFENESIN ER 600 MG PO TB12
600.0000 mg | ORAL_TABLET | Freq: Two times a day (BID) | ORAL | Status: DC
  Administered 2017-09-03 – 2017-09-08 (×11): 600 mg via ORAL
  Filled 2017-09-03 (×12): qty 1

## 2017-09-03 MED ORDER — ASPIRIN EC 81 MG PO TBEC
81.0000 mg | DELAYED_RELEASE_TABLET | Freq: Every day | ORAL | Status: DC
  Administered 2017-09-03 – 2017-09-05 (×3): 81 mg via ORAL
  Filled 2017-09-03 (×3): qty 1

## 2017-09-03 MED ORDER — IPRATROPIUM-ALBUTEROL 0.5-2.5 (3) MG/3ML IN SOLN: 3.0000 mL | Freq: Four times a day (QID) | RESPIRATORY_TRACT | Status: DC | PRN

## 2017-09-03 MED ORDER — INSULIN ASPART 100 UNIT/ML ~~LOC~~ SOLN
SUBCUTANEOUS | Status: AC
  Filled 2017-09-03: qty 1

## 2017-09-03 MED ORDER — ACETAMINOPHEN 325 MG PO TABS: 650.0000 mg | ORAL_TABLET | ORAL | Status: DC | PRN

## 2017-09-03 MED ORDER — TRAZODONE HCL 50 MG PO TABS
50.0000 mg | ORAL_TABLET | Freq: Every day | ORAL | Status: DC
  Administered 2017-09-03 – 2017-09-07 (×5): 50 mg via ORAL
  Filled 2017-09-03 (×5): qty 1

## 2017-09-03 MED ORDER — CEPHALEXIN 500 MG PO CAPS
500.0000 mg | ORAL_CAPSULE | Freq: Three times a day (TID) | ORAL | Status: DC
  Administered 2017-09-03 – 2017-09-08 (×17): 500 mg via ORAL
  Filled 2017-09-03 (×17): qty 1

## 2017-09-03 MED ORDER — ALBUTEROL SULFATE (2.5 MG/3ML) 0.083% IN NEBU
10.0000 mg | INHALATION_SOLUTION | Freq: Once | RESPIRATORY_TRACT | Status: AC
  Administered 2017-09-03: 10 mg via RESPIRATORY_TRACT
  Filled 2017-09-03: qty 12

## 2017-09-03 MED ORDER — ADULT MULTIVITAMIN W/MINERALS CH
1.0000 | ORAL_TABLET | Freq: Every day | ORAL | Status: DC
  Administered 2017-09-03 – 2017-09-08 (×6): 1 via ORAL
  Filled 2017-09-03 (×6): qty 1

## 2017-09-03 MED ORDER — DICLOFENAC SODIUM 1 % TD GEL: 2.0000 g | Freq: Three times a day (TID) | TRANSDERMAL | Status: DC | PRN

## 2017-09-03 MED ORDER — METHOTREXATE 2.5 MG PO TABS
10.0000 mg | ORAL_TABLET | ORAL | Status: DC
  Filled 2017-09-03: qty 4

## 2017-09-03 MED ORDER — ONDANSETRON HCL 4 MG/2ML IJ SOLN
4.0000 mg | Freq: Four times a day (QID) | INTRAMUSCULAR | Status: DC | PRN
  Administered 2017-09-06: 4 mg via INTRAVENOUS
  Filled 2017-09-03: qty 2

## 2017-09-03 MED ORDER — INSULIN ASPART 100 UNIT/ML IV SOLN
10.0000 [IU] | Freq: Once | INTRAVENOUS | Status: AC
  Administered 2017-09-03: 10 [IU] via INTRAVENOUS

## 2017-09-03 MED ORDER — HEPARIN SODIUM (PORCINE) 5000 UNIT/ML IJ SOLN: 5000.0000 [IU] | Freq: Three times a day (TID) | INTRAMUSCULAR | Status: DC

## 2017-09-03 MED ORDER — PANTOPRAZOLE SODIUM 40 MG PO TBEC
40.0000 mg | DELAYED_RELEASE_TABLET | Freq: Every day | ORAL | Status: DC
  Administered 2017-09-03 – 2017-09-05 (×3): 40 mg via ORAL
  Filled 2017-09-03 (×3): qty 1

## 2017-09-03 MED ORDER — MUPIROCIN 2 % EX OINT
1.0000 "application " | TOPICAL_OINTMENT | Freq: Two times a day (BID) | CUTANEOUS | Status: AC
  Administered 2017-09-03 – 2017-09-08 (×10): 1 via NASAL
  Filled 2017-09-03 (×2): qty 22

## 2017-09-03 MED ORDER — DEXTROSE 50 % IV SOLN
1.0000 | Freq: Once | INTRAVENOUS | Status: AC
  Administered 2017-09-03: 50 mL via INTRAVENOUS
  Filled 2017-09-03: qty 50

## 2017-09-03 MED ORDER — ONDANSETRON HCL 4 MG/2ML IJ SOLN
4.0000 mg | Freq: Once | INTRAMUSCULAR | Status: AC
  Administered 2017-09-03: 4 mg via INTRAVENOUS
  Filled 2017-09-03: qty 2

## 2017-09-03 MED ORDER — EZETIMIBE 10 MG PO TABS
10.0000 mg | ORAL_TABLET | Freq: Every day | ORAL | Status: DC
  Administered 2017-09-03 – 2017-09-08 (×6): 10 mg via ORAL
  Filled 2017-09-03 (×7): qty 1

## 2017-09-03 MED ORDER — GABAPENTIN 100 MG PO CAPS
100.0000 mg | ORAL_CAPSULE | Freq: Two times a day (BID) | ORAL | Status: DC
  Administered 2017-09-03 – 2017-09-08 (×11): 100 mg via ORAL
  Filled 2017-09-03 (×11): qty 1

## 2017-09-03 MED ORDER — ALBUTEROL SULFATE (2.5 MG/3ML) 0.083% IN NEBU: 2.5000 mg | INHALATION_SOLUTION | RESPIRATORY_TRACT | Status: DC | PRN

## 2017-09-03 MED ORDER — SODIUM POLYSTYRENE SULFONATE 15 GM/60ML PO SUSP
15.0000 g | Freq: Once | ORAL | Status: DC
  Filled 2017-09-03: qty 60

## 2017-09-03 MED ORDER — NITROGLYCERIN 0.4 MG SL SUBL
0.4000 mg | SUBLINGUAL_TABLET | SUBLINGUAL | Status: DC | PRN
  Administered 2017-09-05 (×3): 0.4 mg via SUBLINGUAL
  Filled 2017-09-03 (×2): qty 1

## 2017-09-03 MED ORDER — CHLORHEXIDINE GLUCONATE CLOTH 2 % EX PADS
6.0000 | MEDICATED_PAD | Freq: Every day | CUTANEOUS | Status: DC
  Administered 2017-09-06 – 2017-09-08 (×3): 6 via TOPICAL

## 2017-09-03 MED ORDER — HYDROXYZINE HCL 25 MG PO TABS
25.0000 mg | ORAL_TABLET | Freq: Three times a day (TID) | ORAL | Status: DC
  Administered 2017-09-03 – 2017-09-08 (×17): 25 mg via ORAL
  Filled 2017-09-03 (×17): qty 1

## 2017-09-03 MED ORDER — HEPARIN (PORCINE) IN NACL 100-0.45 UNIT/ML-% IJ SOLN
2000.0000 [IU]/h | INTRAMUSCULAR | Status: DC
  Administered 2017-09-03 – 2017-09-08 (×9): 2000 [IU]/h via INTRAVENOUS
  Filled 2017-09-03 (×8): qty 250

## 2017-09-03 MED ORDER — FOLIC ACID 1 MG PO TABS
1.0000 mg | ORAL_TABLET | Freq: Every day | ORAL | Status: DC
  Administered 2017-09-03 – 2017-09-08 (×6): 1 mg via ORAL
  Filled 2017-09-03 (×6): qty 1

## 2017-09-03 MED ORDER — AMLODIPINE BESYLATE 5 MG PO TABS
5.0000 mg | ORAL_TABLET | Freq: Every day | ORAL | Status: DC
  Administered 2017-09-03 – 2017-09-08 (×6): 5 mg via ORAL
  Filled 2017-09-03 (×6): qty 1

## 2017-09-03 MED ORDER — ESCITALOPRAM OXALATE 10 MG PO TABS
10.0000 mg | ORAL_TABLET | Freq: Every day | ORAL | Status: DC
  Administered 2017-09-03 – 2017-09-08 (×6): 10 mg via ORAL
  Filled 2017-09-03 (×6): qty 1

## 2017-09-03 MED ORDER — GI COCKTAIL ~~LOC~~
30.0000 mL | Freq: Four times a day (QID) | ORAL | Status: DC | PRN
  Administered 2017-09-03: 30 mL via ORAL
  Filled 2017-09-03: qty 30

## 2017-09-03 MED ORDER — HEPARIN BOLUS VIA INFUSION
5000.0000 [IU] | Freq: Once | INTRAVENOUS | Status: AC
  Administered 2017-09-03: 5000 [IU] via INTRAVENOUS
  Filled 2017-09-03: qty 5000

## 2017-09-03 MED ORDER — INSULIN GLARGINE 100 UNIT/ML ~~LOC~~ SOLN
42.0000 [IU] | Freq: Every day | SUBCUTANEOUS | Status: DC
  Administered 2017-09-03 – 2017-09-07 (×5): 42 [IU] via SUBCUTANEOUS
  Filled 2017-09-03 (×6): qty 0.42

## 2017-09-03 MED ORDER — TORSEMIDE 20 MG PO TABS
20.0000 mg | ORAL_TABLET | Freq: Every day | ORAL | Status: DC
  Administered 2017-09-03 – 2017-09-08 (×6): 20 mg via ORAL
  Filled 2017-09-03 (×6): qty 1

## 2017-09-03 MED ORDER — MORPHINE SULFATE (PF) 4 MG/ML IV SOLN: 1.0000 mg | INTRAVENOUS | Status: DC | PRN

## 2017-09-03 MED ORDER — FENTANYL CITRATE (PF) 100 MCG/2ML IJ SOLN
50.0000 ug | Freq: Once | INTRAMUSCULAR | Status: AC
  Administered 2017-09-03: 50 ug via INTRAVENOUS
  Filled 2017-09-03: qty 2

## 2017-09-03 MED ORDER — SODIUM POLYSTYRENE SULFONATE PO POWD
15.0000 g | Freq: Once | ORAL | Status: AC
  Administered 2017-09-03: 15 g via ORAL
  Filled 2017-09-03: qty 15

## 2017-09-03 MED ORDER — PREDNISONE 10 MG PO TABS
10.0000 mg | ORAL_TABLET | Freq: Every day | ORAL | Status: DC
  Administered 2017-09-03 – 2017-09-08 (×5): 10 mg via ORAL
  Filled 2017-09-03 (×6): qty 1

## 2017-09-03 NOTE — ED Notes (Addendum)
MD Ward notified of Potassium and Troponin

## 2017-09-03 NOTE — H&P (Signed)
History and Physical    [name] JYN:[id number] DOB: 28-Jul-1946 DOA: 09/03/2017   PCP: Gildardo Cranker, DO   Patient coming from:  Nursing Home    Chief Complaint: Shortness of breath and chest pain   HPI: [name] is a 71 y.o. 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  The patient does wear oxygen intermittently, and really felt that he needed it today.  Reports grey productive cough. Denies fevers, chills, night sweats or mucositis.  He denies any chest wall pain or palpitations.Denies any sick contacts or recent long distant travels.  He is very sedentary in fact.  Denies any abdominal pain. Has decreased appetite due to current symptoms. No food indiscretions. Denies dizziness or vertigo.  Reports chronic lower extremity swelling but denies calf pain. No confusion was reported. Denies any vision changes, double vision or headaches.  Denies any unilateral numbness or weakness.   ED Course:  BP 118/83 (BP Location: Right Arm)   Pulse (!) 108   Temp 97.7 F (36.5 C) (Oral)   Resp 15   SpO2 100%   Troponin 0 0.03 Potassium was elevated at 6.5, receiving Kayexalate 30, small amount of IV fluids, D50 and insulin with new lab values are pending.  Of note, prior potassium on 08/09/2017 was 4.8. Creatinine elevated at 2.03 Sinus or ectopic atrial tachycardia Prolonged PR interval Incomplete left bundle branch block Probable left ventricular hypertrophy No significant change  since last tracing  Glucose 307 with anion gap 11 BUN P1 100.7 NA 131  White count 11.8 Hemoglobin 13.1, platelets 266 Chest x-ray negative for active cardiopulmonary disease  Review of Systems:  As per HPI otherwise all other systems reviewed and are negative  Past Medical History:  Diagnosis Date  . Arthritis    "hands, ankles, knees" (11/10/2014)  . Bladder spasms   . CAD (coronary artery disease)   . CAD in native artery 09/15/2015  . CHF (congestive heart failure) ([place])   . Chronic indwelling Foley catheter   . Chronic lower back pain   . Depression   . Diabetic diarrhea ([place])   . Diabetic neuropathy, painful ([place])   . DVT (deep venous thrombosis) (HCC) 1980's   LLE  . GERD (gastroesophageal reflux disease)   . Hyperlipidemia   . Hypertension   . Neurogenic bladder   . Obese   . OSA (obstructive sleep apnea)    "they wanted me to wear a mask; I couldn't" (11/10/2014)  . Pneumonia 1999  . PONV (postoperative nausea and vomiting)   . Stroke [facility]) 10/2011; 06/2014   right hand numbness/notes 10/20/2011, pt not sure he had stroke in 2013; "right hand weaker and right face not quite right since" (11/10/2014)  . Type II diabetes mellitus ([place])   . Uncontrolled type II diabetes with peripheral autonomic neuropathy ([place]) 05/09/2015    Past Surgical History:  Procedure Laterality Date  . CARDIAC CATHETERIZATION N/A 05/02/2015   Procedure: Left Heart Cath and Coronary Angiography;  Surgeon: Lorretta Harp, MD;  Location: [facility];  Service: Cardiovascular;  Laterality: N/A;  . CARDIAC CATHETERIZATION N/A 05/04/2015   Procedure: Left Heart Cath and Coronary Angiography;  Surgeon: Wellington Hampshire, MD;  Location: [facility];  Service: Cardiovascular;  Laterality: N/A;  . CARPAL TUNNEL RELEASE Bilateral ~ 2003-2004  . CHOLECYSTECTOMY OPEN  1970's?  Marland Kitchen GASTROPLASTY    . JOINT REPLACEMENT    . LEFT HEART CATHETERIZATION WITH CORONARY ANGIOGRAM N/A 10/24/2011   Procedure:  LEFT HEART CATHETERIZATION WITH CORONARY ANGIOGRAM;  Surgeon: Candee Furbish, MD;  Location: [facility] CATH LAB;  Service: Cardiovascular;  Laterality: N/A;  right radial artery approach  . TOTAL KNEE ARTHROPLASTY Right 1990's    Social History Social History   Socioeconomic History  . Marital status: Widowed    Spouse name: Not on file  . Number of children: Not on file  . Years of education: Not on file  . Highest education level: Not on file  Social Needs  . Financial resource strain: Not on file  . Food insecurity - worry: Not on file  . Food insecurity - inability: Not on file  . Transportation needs - medical: Not on file  . Transportation needs - non-medical: Not on file  Occupational History  . Occupation: Retired  Tobacco Use  . Smoking status: Former Smoker    Packs/day: 2.00    Years: 20.00    Pack years: 40.00    Types: Cigarettes    Last attempt to quit: 09/10/1976    Years since quitting: 41.0  . Smokeless tobacco: Never Used  Substance and Sexual Activity  . Alcohol use: No    Alcohol/week: 0.0 oz    Comment: FORMER ALCOHOLIC; "sober since 23/55/7322"  . Drug use: No  . Sexual activity: No  Other Topics Concern  . Not on file  Social History Narrative  . Not on file     Allergies  Allergen Reactions  . Ace Inhibitors Swelling    Pt tolerates lisinopril  . Lipitor [Atorvastatin Calcium] Swelling  . Metformin And Related Swelling  . Morphine And Related Other (See Comments)    Sweating, feels like is "in rocky boat."  . Robaxin [Methocarbamol] Other (See Comments)    Feels like he is shaky    Family History  Problem Relation Age of Onset  . Diabetes type I Father   . Hypertension Mother   . Cancer Brother         Testicular Cancer  . Cancer Brother        Leukemia      Prior to Admission medications   Medication Sig Start Date End Date Taking? Authorizing Provider  acetaminophen (TYLENOL) 500 MG tablet Take 1,000 mg by mouth 3 (three) times daily.  06/21/17   [provider]  aluminum-magnesium hydroxide-simethicone (MAALOX) 025-427-06 MG/5ML SUSP Take 30 mLs by mouth every 6 (six) hours as needed.    [provider]  amLODipine (NORVASC) 5 MG tablet Take 5 mg by mouth daily.    [provider]  aspirin EC 81 MG tablet Take 81 mg by mouth daily. 0900    [provider]  diclofenac sodium (VOLTAREN) 1 % GEL Apply topically 3 (three) times daily. Apply to left shoulder topically three times daily     [provider]  escitalopram (LEXAPRO) 10 MG tablet Take 10 mg by mouth daily.     [provider]  ezetimibe (ZETIA) 10 MG tablet Take 10 mg by mouth daily.    [provider]  folic  acid (FOLVITE) 1 MG tablet Take 1 mg by mouth daily.    [provider]  gabapentin (NEURONTIN) 100 MG capsule Take 1 capsule (100 mg total) by mouth 2 (two) times daily. 01/16/16   Reyne Dumas, MD  guaiFENesin (MUCINEX) 600 MG 12 hr tablet Take 600 mg by mouth 2 (two) times daily.    [provider]  hydrOXYzine (ATARAX/VISTARIL) 25 MG tablet Take 25 mg by mouth 3 (three) times daily.     [provider]  Insulin Glargine (TOUJEO SOLOSTAR) 300 UNIT/ML SOPN Inject 42 Units into the skin at bedtime.     [provider]  insulin lispro (HUMALOG) 100 UNIT/ML injection Inject as per sliding scale subcutaneously with meals.  Pt may self administer 0-70 = 0 units 71-450 = 12 units 451-600 Notify MD    [provider]  ipratropium-albuterol (DUONEB) 0.5-2.5 (3) MG/3ML SOLN Take 3 mLs by nebulization every 6 (six) hours as needed (sob).     [provider]  methotrexate (RHEUMATREX) 10 MG tablet Take 10 mg by mouth every Wednesday. Caution: Chemotherapy. Protect from light.    [provider]  metoprolol tartrate (LOPRESSOR) 25 MG tablet Take 12.5 mg by mouth 2 (two) times daily.     [provider]  Multiple Vitamins-Minerals (DECUBI-VITE)  CAPS Take 1 capsule by mouth daily at 3 pm.    [provider]  nitroGLYCERIN (NITROSTAT) 0.4 MG SL tablet Place 0.4 mg under the tongue every 5 (five) minutes as needed for chest pain.    [provider]  omeprazole (PRILOSEC) 20 MG capsule Take 20 mg by mouth daily.    [provider]  OXYGEN Inhale into the lungs. O2 at 2l/min via nasal canula as needed for shortness of breath    [provider]  potassium chloride (K-DUR) 10 MEQ tablet Give 10 mEq by mouth four times a day    [provider]  predniSONE (DELTASONE) 10 MG tablet Take 10 mg by mouth daily.    [provider]  rOPINIRole (REQUIP) 0.5 MG tablet Take 0.5 mg by mouth at bedtime. 2100    [provider]  sennosides-docusate sodium (SENOKOT-S) 8.6-50 MG tablet Take 2 tablets by mouth at bedtime. 2100    [provider]  torsemide (DEMADEX) 20 MG tablet Take 20 mg by mouth daily.     [provider]  traMADol (ULTRAM) 50 MG tablet Take 2 tablets (100 mg total) by mouth 3 (three) times daily. Hold if Lethargic or confused 07/30/17   Gerlene Fee, NP  traZODone (DESYREL) 50 MG tablet Take 50 mg by mouth at bedtime.     [provider]    Physical Exam:  Vitals:   09/03/17 4627 09/03/17 0708 09/03/17 0722 09/03/17 0745  BP: 117/69  118/83   Pulse: (!) 103  95 (!) 108  Resp: 18  17 15   Temp:      TempSrc:      SpO2: 95% 97% 100% 100%   Constitutional: NAD, ill appearing,  Eyes: PERRL, lids and conjunctivae normal ENMT: Mucous membranes are moist, without exudate or lesions  Neck: normal, supple, no masses, no thyromegaly Respiratory: clear to auscultation bilaterally, no wheezing, no crackles. Normal respiratory effort  Cardiovascular: Regular rate and rhythm, 1/6  murmur, rubs or gallops. Chronic 1+ bilateral lower extremity edema. 2+ pedal pulses. No carotid bruits.  Abdomen: Soft, morbidly obese  non tender, No hepatosplenomegaly.  Bowel sounds positive.  Musculoskeletal: no clubbing /  cyanosis. Moves all extremities Skin: no jaundice, No lesions.  Skin is bery dry. Chronic hyperpigmentation on the lower extremities Neurologic: Sensation intact  Strength equal in all extremities Psychiatric:   Alert and oriented x 3. Flat affect     Labs on Admission: I have personally reviewed following labs and imaging studies  CBC: Recent Labs  Lab 09/03/17 0339  WBC 11.8*  HGB 13.1  HCT 39.1  MCV 89.7  PLT 696    Basic Metabolic Panel: Recent Labs  Lab 09/03/17 0452  NA 131*  K 6.5*  CL 99*  CO2 21*  GLUCOSE 307*  BUN 23*  CREATININE 2.03*  CALCIUM 8.5*    GFR: Estimated Creatinine Clearance: 54.6 mL/min (A) (by C-G formula based on SCr of 2.03 mg/dL (H)).  Liver Function Tests: No results for input(s): AST, ALT, ALKPHOS, BILITOT, PROT, ALBUMIN in the last 168 hours. No results for input(s): LIPASE, AMYLASE in the last 168 hours. No results for input(s): AMMONIA in the last 168 hours.  Coagulation Profile: No results for input(s): INR, PROTIME in the last 168 hours.  Cardiac Enzymes: Recent Labs  Lab 09/03/17 0452  TROPONINI 0.03*    BNP (last 3 results) No results for input(s): PROBNP in the last 8760 hours.  HbA1C: No results for input(s): HGBA1C in the last 72 hours.  CBG: No results for input(s): GLUCAP in the last 168 hours.  Lipid Profile: No results for input(s): CHOL, HDL, LDLCALC, TRIG, CHOLHDL, LDLDIRECT in the last 72 hours.  Thyroid Function Tests: No results for input(s): TSH, T4TOTAL, FREET4, T3FREE, THYROIDAB in the last 72 hours.  Anemia Panel: No results for input(s): VITAMINB12, FOLATE, FERRITIN, TIBC, IRON, RETICCTPCT in the last 72 hours.  Urine analysis:    Component Value Date/Time   COLORURINE YELLOW 05/27/2017 1739   APPEARANCEUR CLOUDY (A) 05/27/2017 1739   LABSPEC 1.013 05/27/2017 1739   PHURINE 5.0 05/27/2017 1739   GLUCOSEU NEGATIVE 05/27/2017 1739    HGBUR MODERATE (A) 05/27/2017 1739   BILIRUBINUR NEGATIVE 05/27/2017 1739   KETONESUR NEGATIVE 05/27/2017 1739   PROTEINUR >=300 (A) 05/27/2017 1739   UROBILINOGEN 1.0 07/01/2015 1455   NITRITE POSITIVE (A) 05/27/2017 1739   LEUKOCYTESUR LARGE (A) 05/27/2017 1739    Sepsis Labs: @LABRCNTIP (procalcitonin:4,lacticidven:4) )No results found for this or any previous visit (from the past 240 hour(s)).   Radiological Exams on Admission: Dg Chest 2 View  Result Date: 09/03/2017 CLINICAL DATA:  71 year old male with shortness of breath. EXAM: CHEST  2 VIEW COMPARISON:  Chest radiograph dated 06/01/2016 FINDINGS: Shallow inspiration. No focal consolidation, pleural effusion, or pneumothorax. Stable top-normal cardiac size. No acute osseous pathology. IMPRESSION: No active cardiopulmonary disease. Electronically Signed   By: Anner Crete M.D.   On: 09/03/2017 03:45    EKG: Independently reviewed.  Assessment/Plan Principal Problem:   Chest pain Active Problems:   Hyperkalemia   Diabetic neuropathy (HCC)   Chronic indwelling Foley catheter   Chronic diastolic CHF (congestive heart failure) (HCC)   Neurogenic bladder   Uncontrolled type II diabetes with peripheral autonomic neuropathy (HCC)   Morbid obesity (HCC)   CAD in native artery   GERD (gastroesophageal reflux disease)   Restless legs   Depression   H/O: CVA (cerebrovascular accident)   Hypertensive heart disease with CHF (congestive heart failure) (HCC)   Dyslipidemia associated with type 2 diabetes mellitus (HCC)   Anemia      Chest Pain syndrome accompanied for increasing shortness of breath, worse on exertion, in a  patient with history of CAD, DVT, dCHF, HTN, HLD, history of CVA sedentary lifetstyle. Osats normal .  Unclear etiology at this time troponin 0 0.03. EKG Sinus or ectopic atrial tachycardia No significant change since last tracing .Chest x-ray negative for active cardiopulmonary disease.Potassium was elevated  at 6.5, receiving Kayexalate 30, small amount of IV fluids, D50 and insulin with new lab values are pending.  Of note, prior potassium on 08/09/2017 was 4.8.. His chest pain is somewhat improved at this time. Admit to tele IP  Serial EKG and serial troponin Unable to perform CTA due to elevated creatinine.  Will order bilateral lower extremity ultrasound 2 D echo Serial troponin Continue aspirin, nitroglycerin as needed. Check potassium, check magnesium     Hypertension BP 118/83 (BP Location: Right Arm)   Pulse (!) 108   Temp 97.7 F (36.5 C) (Oral)   Resp 15   SpO2 100%  Continue home anti-hypertensive medications   Hyperlipidemia Continue home Zetia  Diastolic CHF: No acute decompensation weight 340 lbs . Last 2 D Echo EF 55-60, Gr 1 DD, normal systolic  Continue Lasix,  Beta Blocker, Norvasc, Demadex Obtain daily weights Monitor intake and output 2 D echo pending      Recent UTI in a patient with a History of urine retention, on chronic indwelling catheter . UA pending. WBC 11 (pred) Continue home Keflex    Undiagnosed COPD, patient on O2 prn at home Continue O2 while in hospital  Duonebs and Albuterol prn  GERD, no acute symptoms Continue PPI  History of Bullous Pemphigus, no acute issues Continue chronic prednisone and MTX   Acute on Chronic kidney disease stage 3 likely in the setting of recent infection, meds, poor liquid oral intake    baseline creatinine 0.8, current 2.03       Lab Results  Component Value Date   CREATININE 2.03 (H) 09/03/2017   CREATININE 0.8 08/09/2017   CREATININE 0.82 06/03/2017    very gentle IVF Repeat CMET in am      Type II Diabetes with Neuropathy Current blood sugar level is in the 300s  Lab Results  Component Value Date   HGBA1C 8.8 07/10/2017   Hgb A1C Lantus , SSI Continue Neurontin   Depression and annxiety Continue home  Lexapro and Xanax    Deconditioning  Obtain PT/OT evaluation    DVT prophylaxis:   Heparin  Code Status:    Full  Family Communication:  Discussed with patient Disposition Plan: Expect patient to be discharged to nursing home after condition improves Consults called:    None   Admission status: Tele Inpatient    Sharene Butters, PA-C [facility]   09/03/2017, 8:18 AM

## 2017-09-03 NOTE — Progress Notes (Signed)
This is a no charge note  Pending admission per Dr. Leonides Schanz  71 year old male with past medical history of hypertension, hyperlipidemia, diabetes mellitus, stroke, depression, DVT, CHF, dCAD, who presents with chest pain and shortness rest. Patient was also found to have hyperkalemia with potassium 6.5, which was treated. Acute renal injury with creatinine 2.03. Chest x-ray negative. EKG showed incomplete left bundle blockage, poor R-wave progression. Troponin moderately +0.03. Patient is admitted to telemetry bed as inpatient.   Ivor Costa, MD  [facility] Pager [phone]  If 7PM-7AM, please contact night-coverage [email or web address] Password [facility] 09/03/2017, 6:31 AM

## 2017-09-03 NOTE — Progress Notes (Signed)
ANTICOAGULATION CONSULT NOTE - Initial Consult  Pharmacy Consult for heparin Indication: DVT and possible pulmonary embolus  Allergies  Allergen Reactions  . Ace Inhibitors Swelling    Pt tolerates lisinopril  . Lipitor [Atorvastatin Calcium] Swelling  . Metformin And Related Swelling  . Morphine And Related Other (See Comments)    Sweating, feels like is "in rocky boat."  . Robaxin [Methocarbamol] Other (See Comments)    Feels like he is shaky    Patient Measurements: Height: 6\' 1"  (185.4 cm) Weight: (!) 362 lb 7 oz (164.4 kg)(scale bed ) IBW/kg (Calculated) : 79.9 Heparin Dosing Weight: 119 kg  Vital Signs: Temp: 97.6 F (36.4 C) (12/25 1232) Temp Source: Oral (12/25 1232) BP: 90/71 (12/25 1232) Pulse Rate: 127 (12/25 1232)  Labs: Recent Labs    09/03/17 0339 09/03/17 0452  HGB 13.1  --   HCT 39.1  --   PLT 266  --   CREATININE  --  2.03*  TROPONINI  --  0.03*    Estimated Creatinine Clearance: 55.2 mL/min (A) (by C-G formula based on SCr of 2.03 mg/dL (H)).   Medical History: Past Medical History:  Diagnosis Date  . Arthritis    "hands, ankles, knees" (11/10/2014)  . Bladder spasms   . CAD (coronary artery disease)   . CAD in native artery 09/15/2015  . CHF (congestive heart failure) ([place])   . Chronic indwelling Foley catheter   . Chronic lower back pain   . Depression   . Diabetic diarrhea ([place])   . Diabetic neuropathy, painful ([place])   . DVT (deep venous thrombosis) (HCC) 1980's   LLE  . GERD (gastroesophageal reflux disease)   . Hyperlipidemia   . Hypertension   . Neurogenic bladder   . Obese   . OSA (obstructive sleep apnea)    "they wanted me to wear a mask; I couldn't" (11/10/2014)  . Pneumonia 1999  . PONV (postoperative nausea and vomiting)   . Stroke [facility]) 10/2011; 06/2014   right hand numbness/notes 10/20/2011, pt not sure he had stroke in 2013; "right hand weaker and right face not quite right since" (11/10/2014)  . Type II diabetes mellitus  ([place])   . Uncontrolled type II diabetes with peripheral autonomic neuropathy ([place]) 05/09/2015    Medications:  Medications Prior to Admission  Medication Sig Dispense Refill Last Dose  . acetaminophen (TYLENOL) 500 MG tablet Take 1,000 mg by mouth 3 (three) times daily.   09/02/2017 at Unknown time  . aluminum-magnesium hydroxide-simethicone (MAALOX) 272-536-64 MG/5ML SUSP Take 30 mLs by mouth every 6 (six) hours as needed (indigestion).    unk  . amLODipine (NORVASC) 5 MG tablet Take 5 mg by mouth daily.   09/01/2017  . aspirin EC 81 MG tablet Take 81 mg by mouth daily. 0900   09/01/2017 at 1000  . cephALEXin (KEFLEX) 500 MG capsule Take 500 mg by mouth 3 (three) times daily.   09/02/2017 at Unknown time  . diclofenac sodium (VOLTAREN) 1 % GEL Apply topically 3 (three) times daily. Apply to left shoulder topically three times daily    09/02/2017 at Unknown time  . escitalopram (LEXAPRO) 10 MG tablet Take 10 mg by mouth daily.    09/01/2017  . ezetimibe (ZETIA) 10 MG tablet Take 10 mg by mouth daily.   09/02/2017 at Unknown time  . folic acid (FOLVITE) 1 MG tablet Take 1 mg by mouth daily.   09/01/2017 at Unknown time  . gabapentin (NEURONTIN) 100 MG capsule Take 1  capsule (100 mg total) by mouth 2 (two) times daily. 60 capsule 0 09/02/2017 at Unknown time  . guaiFENesin (MUCINEX) 600 MG 12 hr tablet Take 600 mg by mouth 2 (two) times daily.   09/02/2017 at Unknown time  . hydrOXYzine (ATARAX/VISTARIL) 25 MG tablet Take 25 mg by mouth 3 (three) times daily.    09/02/2017 at Unknown time  . Insulin Glargine (TOUJEO SOLOSTAR) 300 UNIT/ML SOPN Inject 42 Units into the skin at bedtime.    09/02/2017 at Unknown time  . insulin lispro (HUMALOG) 100 UNIT/ML injection Inject 12 Units into the skin 3 (three) times daily with meals.    09/02/2017 at Unknown time  . ipratropium-albuterol (DUONEB) 0.5-2.5 (3) MG/3ML SOLN Take 3 mLs by nebulization every 6 (six) hours as needed (sob).    unk  . methotrexate  (RHEUMATREX) 10 MG tablet Take 10 mg by mouth every Wednesday. Caution: Chemotherapy. Protect from light.   08/28/2017  . metoprolol tartrate (LOPRESSOR) 25 MG tablet Take 12.5 mg by mouth 2 (two) times daily.    09/02/2017 at 2200  . Multiple Vitamins-Minerals (DECUBI-VITE) CAPS Take 1 capsule by mouth daily at 3 pm.   09/01/2017  . nitroGLYCERIN (NITROSTAT) 0.4 MG SL tablet Place 0.4 mg under the tongue every 5 (five) minutes as needed for chest pain.   unk  . omeprazole (PRILOSEC) 20 MG capsule Take 20 mg by mouth daily.   09/02/2017 at Unknown time  . OXYGEN Inhale into the lungs. O2 at 2l/min via nasal canula as needed for shortness of breath   unk  . potassium chloride (K-DUR) 10 MEQ tablet Take 10 mEq by mouth 4 (four) times daily.    09/02/2017 at Unknown time  . predniSONE (DELTASONE) 10 MG tablet Take 10 mg by mouth daily.   09/02/2017 at Unknown time  . rOPINIRole (REQUIP) 0.5 MG tablet Take 0.5 mg by mouth at bedtime. 2100   09/02/2017 at Unknown time  . sennosides-docusate sodium (SENOKOT-S) 8.6-50 MG tablet Take 2 tablets by mouth at bedtime. 2100   09/02/2017 at Unknown time  . torsemide (DEMADEX) 20 MG tablet Take 20 mg by mouth daily.    09/01/2017  . traMADol (ULTRAM) 50 MG tablet Take 2 tablets (100 mg total) by mouth 3 (three) times daily. Hold if Lethargic or confused 180 tablet 0 09/02/2017 at Unknown time  . traZODone (DESYREL) 50 MG tablet Take 50 mg by mouth at bedtime.    09/02/2017 at Unknown time    Assessment: 78 yoM admitted with DVT and possible PE. Unable to obtain CTA with contrast d/t AKI. However patient with tachycardia, dyspnea, clear CXR and high risk for PE.  Not previously on anticoagulation.   Goal of Therapy:  Heparin level 0.3-0.7 units/ml Monitor platelets by anticoagulation protocol: Yes   Plan:  Give 5000 units bolus x 1 Start heparin infusion at 2000 units/hr Check anti-Xa level in 8 hours and daily while on heparin Continue to monitor H&H and  platelets  Thank you for allowing [place] to participate in this patients care.  Jens Som, PharmD Clinical phone for 09/03/2017 from 7a-3:30p: [phone] If after 3:30p, please call main pharmacy at: [phone] 09/03/2017 6:22 PM

## 2017-09-03 NOTE — Progress Notes (Signed)
Bilateral lower extremity venous duplex has been completed. There is evidence of age indeterminate deep vein thrombosis involving the popliteal vein of the right lower extremity. Results were given to the patient's nurse, Miranda.   09/03/17 3:38 PM [name] RVT

## 2017-09-03 NOTE — ED Provider Notes (Signed)
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  He does wear oxygen intermittently and states that he really felt like he needed it today.  No nausea, vomiting, dizziness or diaphoresis.  Has had cough with gray sputum production.  No fever.  ROS: See HPI Constitutional: no fever  Eyes: no drainage  ENT: no runny nose   Cardiovascular:   chest pain  Resp: SOB  GI: no vomiting GU: no dysuria Integumentary: no rash  Allergy: no hives  Musculoskeletal: no leg swelling  Neurological: no slurred speech ROS otherwise negative  PAST MEDICAL HISTORY/PAST SURGICAL HISTORY:  Past Medical History:  Diagnosis Date  . Arthritis    "hands, ankles, knees" (11/10/2014)  . Bladder spasms   . CAD (coronary artery disease)   . CAD in native artery 09/15/2015  . CHF (congestive heart failure) ([place])   . Chronic indwelling Foley catheter   . Chronic lower back pain   . Depression   . Diabetic diarrhea ([place])   . Diabetic neuropathy, painful ([place])   . DVT (deep venous thrombosis) (HCC) 1980's   LLE  . GERD (gastroesophageal reflux disease)   . Hyperlipidemia   . Hypertension   . Neurogenic bladder   . Obese   . OSA (obstructive sleep apnea)    "they wanted me to wear a mask; I couldn't" (11/10/2014)  . Pneumonia 1999  . PONV (postoperative nausea and vomiting)   . Stroke [facility]) 10/2011; 06/2014   right hand numbness/notes 10/20/2011, pt not sure he had stroke in 2013; "right hand weaker and right face not quite right since" (11/10/2014)  . Type II diabetes mellitus ([place])   . Uncontrolled type II diabetes with peripheral autonomic neuropathy ([place])  05/09/2015    MEDICATIONS:  Prior to Admission medications   Medication Sig Start Date End Date Taking? Authorizing Provider  acetaminophen (TYLENOL) 500 MG tablet Take 1,000 mg by mouth 3 (three) times daily. 06/21/17   [provider]  aluminum-magnesium hydroxide-simethicone (MAALOX) 836-629-47 MG/5ML SUSP Take 30 mLs by mouth every 6 (six) hours as needed.    [provider]  amLODipine (NORVASC) 5 MG tablet Take 5 mg by mouth daily.    [provider]  aspirin EC 81 MG tablet Take 81 mg by mouth daily. 0900    [provider]  diclofenac sodium (VOLTAREN) 1 % GEL Apply topically 3 (three) times daily. Apply to left shoulder topically three times daily     [provider]  escitalopram (LEXAPRO) 10 MG tablet Take 10 mg by mouth daily.     [provider]  ezetimibe (ZETIA) 10 MG tablet Take 10 mg by mouth daily.    [provider]  folic acid (FOLVITE) 1 MG tablet Take 1 mg by mouth daily.    [provider]  gabapentin (NEURONTIN) 100 MG capsule Take 1 capsule (100 mg total) by mouth 2 (two) times daily. 01/16/16   Reyne Dumas, MD  guaiFENesin (MUCINEX) 600 MG 12 hr tablet Take 600 mg by mouth 2 (two) times daily.    [provider]  hydrOXYzine (ATARAX/VISTARIL) 25 MG tablet Take 25 mg by mouth 3 (three) times daily.  [provider]  Insulin Glargine (TOUJEO SOLOSTAR) 300 UNIT/ML SOPN Inject 42 Units into the skin at bedtime.     [provider]  insulin lispro (HUMALOG) 100 UNIT/ML injection Inject as per sliding scale subcutaneously with meals.  Pt may self administer 0-70 = 0 units 71-450 = 12 units 451-600 Notify MD    [provider]  ipratropium-albuterol (DUONEB) 0.5-2.5 (3) MG/3ML SOLN Take 3 mLs by nebulization every 6 (six) hours as needed (sob).     [provider]  methotrexate (RHEUMATREX) 10 MG tablet Take 10 mg by mouth every Wednesday. Caution:  Chemotherapy. Protect from light.    [provider]  metoprolol tartrate (LOPRESSOR) 25 MG tablet Take 12.5 mg by mouth 2 (two) times daily.     [provider]  Multiple Vitamins-Minerals (DECUBI-VITE) CAPS Take 1 capsule by mouth daily at 3 pm.    [provider]  nitroGLYCERIN (NITROSTAT) 0.4 MG SL tablet Place 0.4 mg under the tongue every 5 (five) minutes as needed for chest pain.    [provider]  omeprazole (PRILOSEC) 20 MG capsule Take 20 mg by mouth daily.    [provider]  OXYGEN Inhale into the lungs. O2 at 2l/min via nasal canula as needed for shortness of breath    [provider]  potassium chloride (K-DUR) 10 MEQ tablet Give 10 mEq by mouth four times a day    [provider]  predniSONE (DELTASONE) 10 MG tablet Take 10 mg by mouth daily.    [provider]  rOPINIRole (REQUIP) 0.5 MG tablet Take 0.5 mg by mouth at bedtime. 2100    [provider]  sennosides-docusate sodium (SENOKOT-S) 8.6-50 MG tablet Take 2 tablets by mouth at bedtime. 2100    [provider]  torsemide (DEMADEX) 20 MG tablet Take 20 mg by mouth daily.     [provider]  traMADol (ULTRAM) 50 MG tablet Take 2 tablets (100 mg total) by mouth 3 (three) times daily. Hold if Lethargic or confused 07/30/17   Gerlene Fee, NP  traZODone (DESYREL) 50 MG tablet Take 50 mg by mouth at bedtime.     [provider]    ALLERGIES:  Allergies  Allergen Reactions  . Ace Inhibitors Swelling    Pt tolerates lisinopril  . Lipitor [Atorvastatin Calcium] Swelling  . Metformin And Related Swelling  . Morphine And Related Other (See Comments)    Sweating, feels like is "in rocky boat."  . Robaxin [Methocarbamol] Other (See Comments)    Feels like he is shaky    SOCIAL HISTORY:  Social History   Tobacco Use  . Smoking status: Former Smoker    Packs/day: 2.00    Years: 20.00    Pack years: 40.00     Types: Cigarettes    Last attempt to quit: 09/10/1976    Years since quitting: 41.0  . Smokeless tobacco: Never Used  Substance Use Topics  . Alcohol use: No    Alcohol/week: 0.0 oz    Comment: FORMER ALCOHOLIC; "sober since 94/49/6759"    FAMILY HISTORY: Family History  Problem Relation Age of Onset  . Diabetes type I Father   . Hypertension Mother   . Cancer Brother         Testicular Cancer  . Cancer Brother        Leukemia    EXAM: BP (!) 114/95   Pulse 100   Temp 97.7 F (36.5 C) (Oral)   Resp  20   SpO2 93%  CONSTITUTIONAL: Alert and oriented and responds appropriately to questions.  Patient is morbidly obese.  Afebrile. HEAD: Normocephalic EYES: Conjunctivae clear, pupils appear equal, EOMI ENT: normal nose; moist mucous membranes NECK: Supple, no meningismus, no nuchal rigidity, no LAD  CARD: RRR; S1 and S2 appreciated; no murmurs, no clicks, no rubs, no gallops RESP: Normal chest excursion without splinting, patient is tachypneic and appears short of breath with just talking to me in the bed and any minimal movement, I do not appreciate any wheezing or rhonchi, no rales ABD/GI: Normal bowel sounds; non-distended; soft, non-tender, no rebound, no guarding, no peritoneal signs, no hepatosplenomegaly BACK:  The back appears normal and is non-tender to palpation, there is no CVA tenderness EXT: Normal ROM in all joints; non-tender to palpation; bilateral nonpitting lower extremity edema; normal capillary refill; no cyanosis, no calf tenderness or swelling    SKIN: Normal color for age and race; warm; no rash NEURO: Moves all extremities equally PSYCH: The patient's mood and manner are appropriate. Grooming and personal hygiene are appropriate.  MEDICAL DECISION MAKING: Patient here with chest pain or shortness of breath.  Has multiple risk factors.  Concern for ACS, CHF exacerbation, pneumonia given he is immunocompromised.  Will obtain labs, chest x-ray.  EKG shows no new  ischemic abnormality.  Will give aspirin.  Will give fentanyl for his pain as his systolic blood pressure is currently 100 and I do not feel the safe to give him nitroglycerin.  ED PROGRESS: Patient's troponin is 0.03.  He has elevated potassium 6.5 with no visible hemolysis with no EKG changes.  Creatinine elevated at 2.03.  Will give small IV fluid bolus, D50 and insulin, albuterol, Kayexalate.  Will hydrate gently given patient has history of CHF.  Will discuss with medicine for admission.  6:37 AM Discussed patient's case with hospitalist, Dr. Blaine Hamper.  I have recommended admission and patient (and family if present) agree with this plan. Admitting physician will place admission orders.   I reviewed all nursing notes, vitals, pertinent previous records, EKGs, lab and urine results, imaging (as available).     EKG Interpretation  Date/Time:  Tuesday September 03 2017 02:53:55 EST Ventricular Rate:  101 PR Interval:    QRS Duration: 117 QT Interval:  342 QTC Calculation: 444 R Axis:   -61 Text Interpretation:  Sinus or ectopic atrial tachycardia Prolonged PR interval Incomplete left bundle branch block Probable left ventricular hypertrophy No significant change since last tracing other than rate is faster Confirmed by Ward, Cyril Mourning [id number]) on 09/03/2017 2:57:47 AM         Ward, Delice Bison, DO 09/03/17 [id number]

## 2017-09-03 NOTE — Progress Notes (Signed)
MD on call notified of patients troponin level. MD also notified that patient has stage 2 areas on bottom and his bed is too small for him to turn in.

## 2017-09-03 NOTE — ED Triage Notes (Signed)
Per EMS pt from facility. Chronic CHF.  Worsening chest pain all day to the point he asked for nitro at 0110 this morning.  Repeated x 3 with some improvement. Central pain, radiating to right shoulder and right neck. Dull pressure.  Pt now rates 3/10.  No ASA given d/t dry mouth per EMS.

## 2017-09-03 NOTE — Plan of Care (Signed)
DVT u/s: shows R proximal DVT (popliteal vein) age indeterminate. Given tachycardia, dyspnea, clear CXR and high risk for PE believe this DVT is likely subacute to acute. Will need treatment to either prevent propagation to lungs or to treat a concurrent PE that we are unable to diagnose given his AKI prohibiting use of contrast with CTA.   Heparin pharmacy protocol for VTE treatment initiated. Of note this will be patient's second DVT ( prior in 51s)  Desiree Hane, MD [facility]

## 2017-09-04 ENCOUNTER — Inpatient Hospital Stay (HOSPITAL_COMMUNITY): Payer: Medicare Other

## 2017-09-04 DIAGNOSIS — N189 Chronic kidney disease, unspecified: Secondary | ICD-10-CM

## 2017-09-04 DIAGNOSIS — R079 Chest pain, unspecified: Secondary | ICD-10-CM

## 2017-09-04 DIAGNOSIS — I2510 Atherosclerotic heart disease of native coronary artery without angina pectoris: Secondary | ICD-10-CM

## 2017-09-04 DIAGNOSIS — Z9289 Personal history of other medical treatment: Secondary | ICD-10-CM

## 2017-09-04 DIAGNOSIS — I5032 Chronic diastolic (congestive) heart failure: Secondary | ICD-10-CM

## 2017-09-04 DIAGNOSIS — E1169 Type 2 diabetes mellitus with other specified complication: Secondary | ICD-10-CM

## 2017-09-04 DIAGNOSIS — R0789 Other chest pain: Secondary | ICD-10-CM

## 2017-09-04 DIAGNOSIS — E785 Hyperlipidemia, unspecified: Secondary | ICD-10-CM

## 2017-09-04 LAB — BASIC METABOLIC PANEL
ANION GAP: 13 (ref 5–15)
BUN: 19 mg/dL (ref 6–20)
CALCIUM: 8.2 mg/dL — AB (ref 8.9–10.3)
CO2: 22 mmol/L (ref 22–32)
Chloride: 99 mmol/L — ABNORMAL LOW (ref 101–111)
Creatinine, Ser: 1.03 mg/dL (ref 0.61–1.24)
GFR calc Af Amer: >60 mL/min (ref >60)
GLUCOSE: 283 mg/dL — AB (ref 65–99)
POTASSIUM: 4.2 mmol/L (ref 3.5–5.1)
SODIUM: 134 mmol/L — AB (ref 135–145)

## 2017-09-04 LAB — HEPARIN LEVEL (UNFRACTIONATED)
HEPARIN UNFRACTIONATED: 0.41 [IU]/mL (ref 0.30–0.70)
Heparin Unfractionated: 0.55 IU/mL (ref 0.30–0.70)

## 2017-09-04 LAB — ECHOCARDIOGRAM COMPLETE
Height: 73 in
Weight: 5814.4 oz

## 2017-09-04 LAB — GLUCOSE, CAPILLARY
GLUCOSE-CAPILLARY: 274 mg/dL — AB (ref 65–99)
GLUCOSE-CAPILLARY: 228 mg/dL — AB (ref 65–99)
Glucose-Capillary: 212 mg/dL — ABNORMAL HIGH (ref 65–99)
Glucose-Capillary: 184 mg/dL — ABNORMAL HIGH (ref 65–99)

## 2017-09-04 MED ORDER — SENNOSIDES-DOCUSATE SODIUM 8.6-50 MG PO TABS
2.0000 | ORAL_TABLET | Freq: Every day | ORAL | Status: DC
  Administered 2017-09-04 – 2017-09-07 (×4): 2 via ORAL
  Filled 2017-09-04 (×4): qty 2

## 2017-09-04 MED ORDER — INSULIN ASPART 100 UNIT/ML ~~LOC~~ SOLN
0.0000 [IU] | Freq: Every day | SUBCUTANEOUS | Status: DC
  Administered 2017-09-05: 2 [IU] via SUBCUTANEOUS

## 2017-09-04 MED ORDER — INSULIN ASPART 100 UNIT/ML ~~LOC~~ SOLN
4.0000 [IU] | Freq: Three times a day (TID) | SUBCUTANEOUS | Status: DC
  Administered 2017-09-04 – 2017-09-08 (×11): 4 [IU] via SUBCUTANEOUS

## 2017-09-04 MED ORDER — ROPINIROLE HCL 0.5 MG PO TABS
0.5000 mg | ORAL_TABLET | Freq: Every day | ORAL | Status: DC
  Administered 2017-09-04 – 2017-09-07 (×4): 0.5 mg via ORAL
  Filled 2017-09-04 (×5): qty 1

## 2017-09-04 MED ORDER — SENNA-DOCUSATE SODIUM 8.6-50 MG PO TABS: 2.0000 | ORAL_TABLET | Freq: Every day | ORAL | Status: DC

## 2017-09-04 MED ORDER — INSULIN ASPART 100 UNIT/ML ~~LOC~~ SOLN
0.0000 [IU] | Freq: Three times a day (TID) | SUBCUTANEOUS | Status: DC
  Administered 2017-09-04: 5 [IU] via SUBCUTANEOUS
  Administered 2017-09-05: 1 [IU] via SUBCUTANEOUS
  Administered 2017-09-05: 5 [IU] via SUBCUTANEOUS
  Administered 2017-09-05: 8 [IU] via SUBCUTANEOUS
  Administered 2017-09-06 (×2): 2 [IU] via SUBCUTANEOUS
  Administered 2017-09-07 (×2): 3 [IU] via SUBCUTANEOUS
  Administered 2017-09-07: 5 [IU] via SUBCUTANEOUS
  Administered 2017-09-08: 3 [IU] via SUBCUTANEOUS
  Administered 2017-09-08: 5 [IU] via SUBCUTANEOUS

## 2017-09-04 MED ORDER — MAGNESIUM SULFATE 2 GM/50ML IV SOLN
2.0000 g | Freq: Once | INTRAVENOUS | Status: AC
  Administered 2017-09-04: 2 g via INTRAVENOUS
  Filled 2017-09-04: qty 50

## 2017-09-04 MED ORDER — PERFLUTREN LIPID MICROSPHERE
INTRAVENOUS | Status: AC
  Administered 2017-09-04: 4 mL
  Filled 2017-09-04: qty 10

## 2017-09-04 MED ORDER — PERFLUTREN LIPID MICROSPHERE
1.0000 mL | INTRAVENOUS | Status: AC | PRN
  Filled 2017-09-04: qty 10

## 2017-09-04 MED ORDER — TECHNETIUM TO 99M ALBUMIN AGGREGATED
4.2000 | Freq: Once | INTRAVENOUS | Status: AC | PRN
  Administered 2017-09-04: 4.2 via INTRAVENOUS

## 2017-09-04 MED ORDER — TECHNETIUM TC 99M DIETHYLENETRIAME-PENTAACETIC ACID
30.0000 | Freq: Once | INTRAVENOUS | Status: AC | PRN
  Administered 2017-09-04: 30 via RESPIRATORY_TRACT

## 2017-09-04 NOTE — Consult Note (Signed)
   [facility] CM Inpatient Consult   09/04/2017  [name] 1946/04/30 [id number]  Chart reviewed for high risk assessment.  Patient is a resident of [place] [place].  [facility] care management needs assessed at this time.  [name], RN BSN [facility] Liaison  [phone] business mobile phone Toll free office [phone]

## 2017-09-04 NOTE — Progress Notes (Signed)
ANTICOAGULATION CONSULT NOTE - F/u Consult  Pharmacy Consult for heparin Indication: DVT and possible pulmonary embolus  Allergies  Allergen Reactions  . Ace Inhibitors Swelling    Pt tolerates lisinopril  . Lipitor [Atorvastatin Calcium] Swelling  . Metformin And Related Swelling  . Morphine And Related Other (See Comments)    Sweating, feels like is "in rocky boat."  . Robaxin [Methocarbamol] Other (See Comments)    Feels like he is shaky   Patient Measurements: Height: 6\' 1"  (185.4 cm) Weight: (!) 362 lb 7 oz (164.4 kg)(scale bed ) IBW/kg (Calculated) : 79.9 Heparin Dosing Weight: 119 kg  Vital Signs: Temp: 97.3 F (36.3 C) (12/25 2012) Temp Source: Oral (12/25 2012) BP: 96/48 (12/25 2012) Pulse Rate: 120 (12/25 2012)  Labs: Recent Labs    09/03/17 0339  09/03/17 0452 09/03/17 1613 09/03/17 1930 09/03/17 2155 09/04/17 0252  HGB 13.1  --   --   --   --   --   --   HCT 39.1  --   --   --   --   --   --   PLT 266  --   --   --   --   --   --   HEPARINUNFRC  --   --   --   --   --   --  0.55  CREATININE  --   --  2.03*  --   --   --   --   TROPONINI  --    < > 0.03* <0.03 0.03* <0.03  --    < > = values in this interval not displayed.    Estimated Creatinine Clearance: 55.2 mL/min (A) (by C-G formula based on SCr of 2.03 mg/dL (H)).   Medical History: Past Medical History:  Diagnosis Date  . Arthritis    "hands, ankles, knees" (11/10/2014)  . Bladder spasms   . CAD (coronary artery disease)   . CAD in native artery 09/15/2015  . CHF (congestive heart failure) ([place])   . Chronic indwelling Foley catheter   . Chronic lower back pain   . Depression   . Diabetic diarrhea ([place])   . Diabetic neuropathy, painful ([place])   . DVT (deep venous thrombosis) (HCC) 1980's   LLE  . GERD (gastroesophageal reflux disease)   . Hyperlipidemia   . Hypertension   . Neurogenic bladder   . Obese   . OSA (obstructive sleep apnea)    "they wanted me to wear a mask; I couldn't"  (11/10/2014)  . Pneumonia 1999  . PONV (postoperative nausea and vomiting)   . Stroke [facility]) 10/2011; 06/2014   right hand numbness/notes 10/20/2011, pt not sure he had stroke in 2013; "right hand weaker and right face not quite right since" (11/10/2014)  . Type II diabetes mellitus ([place])   . Uncontrolled type II diabetes with peripheral autonomic neuropathy ([place]) 05/09/2015    Medications:  Medications Prior to Admission  Medication Sig Dispense Refill Last Dose  . acetaminophen (TYLENOL) 500 MG tablet Take 1,000 mg by mouth 3 (three) times daily.   09/02/2017 at Unknown time  . aluminum-magnesium hydroxide-simethicone (MAALOX) 244-010-27 MG/5ML SUSP Take 30 mLs by mouth every 6 (six) hours as needed (indigestion).    unk  . amLODipine (NORVASC) 5 MG tablet Take 5 mg by mouth daily.   09/01/2017  . aspirin EC 81 MG tablet Take 81 mg by mouth daily. 0900   09/01/2017 at 1000  . cephALEXin (KEFLEX) 500 MG  capsule Take 500 mg by mouth 3 (three) times daily.   09/02/2017 at Unknown time  . diclofenac sodium (VOLTAREN) 1 % GEL Apply topically 3 (three) times daily. Apply to left shoulder topically three times daily    09/02/2017 at Unknown time  . escitalopram (LEXAPRO) 10 MG tablet Take 10 mg by mouth daily.    09/01/2017  . ezetimibe (ZETIA) 10 MG tablet Take 10 mg by mouth daily.   09/02/2017 at Unknown time  . folic acid (FOLVITE) 1 MG tablet Take 1 mg by mouth daily.   09/01/2017 at Unknown time  . gabapentin (NEURONTIN) 100 MG capsule Take 1 capsule (100 mg total) by mouth 2 (two) times daily. 60 capsule 0 09/02/2017 at Unknown time  . guaiFENesin (MUCINEX) 600 MG 12 hr tablet Take 600 mg by mouth 2 (two) times daily.   09/02/2017 at Unknown time  . hydrOXYzine (ATARAX/VISTARIL) 25 MG tablet Take 25 mg by mouth 3 (three) times daily.    09/02/2017 at Unknown time  . Insulin Glargine (TOUJEO SOLOSTAR) 300 UNIT/ML SOPN Inject 42 Units into the skin at bedtime.    09/02/2017 at Unknown time  . insulin  lispro (HUMALOG) 100 UNIT/ML injection Inject 12 Units into the skin 3 (three) times daily with meals.    09/02/2017 at Unknown time  . ipratropium-albuterol (DUONEB) 0.5-2.5 (3) MG/3ML SOLN Take 3 mLs by nebulization every 6 (six) hours as needed (sob).    unk  . methotrexate (RHEUMATREX) 10 MG tablet Take 10 mg by mouth every Wednesday. Caution: Chemotherapy. Protect from light.   08/28/2017  . metoprolol tartrate (LOPRESSOR) 25 MG tablet Take 12.5 mg by mouth 2 (two) times daily.    09/02/2017 at 2200  . Multiple Vitamins-Minerals (DECUBI-VITE) CAPS Take 1 capsule by mouth daily at 3 pm.   09/01/2017  . nitroGLYCERIN (NITROSTAT) 0.4 MG SL tablet Place 0.4 mg under the tongue every 5 (five) minutes as needed for chest pain.   unk  . omeprazole (PRILOSEC) 20 MG capsule Take 20 mg by mouth daily.   09/02/2017 at Unknown time  . OXYGEN Inhale into the lungs. O2 at 2l/min via nasal canula as needed for shortness of breath   unk  . potassium chloride (K-DUR) 10 MEQ tablet Take 10 mEq by mouth 4 (four) times daily.    09/02/2017 at Unknown time  . predniSONE (DELTASONE) 10 MG tablet Take 10 mg by mouth daily.   09/02/2017 at Unknown time  . rOPINIRole (REQUIP) 0.5 MG tablet Take 0.5 mg by mouth at bedtime. 2100   09/02/2017 at Unknown time  . sennosides-docusate sodium (SENOKOT-S) 8.6-50 MG tablet Take 2 tablets by mouth at bedtime. 2100   09/02/2017 at Unknown time  . torsemide (DEMADEX) 20 MG tablet Take 20 mg by mouth daily.    09/01/2017  . traMADol (ULTRAM) 50 MG tablet Take 2 tablets (100 mg total) by mouth 3 (three) times daily. Hold if Lethargic or confused 180 tablet 0 09/02/2017 at Unknown time  . traZODone (DESYREL) 50 MG tablet Take 50 mg by mouth at bedtime.    09/02/2017 at Unknown time   Assessment: 51 yoM admitted with DVT and possible PE. Unable to obtain CTA with contrast d/t AKI.   Initial heparin level is therapeutic at 0.55. No new CBC, however, no bleeding documented.   Goal of  Therapy:  Heparin level 0.3-0.7 units/ml Monitor platelets by anticoagulation protocol: Yes   Plan:  Continue heparin gtt at 2000 units/hr Confirm heparin level  in 6 hrs Daily heparin level and CBC Monitor for s/s bleeding  Lavonda Jumbo, PharmD Clinical Pharmacist 09/04/17 3:37 AM

## 2017-09-04 NOTE — Progress Notes (Signed)
Inpatient Diabetes Program Recommendations  AACE/ADA: New Consensus Statement on Inpatient Glycemic Control (2015)  Target Ranges:  Prepandial:   less than 140 mg/dL      Peak postprandial:   less than 180 mg/dL (1-2 hours)      Critically ill patients:  140 - 180 mg/dL   Lab Results  Component Value Date   GLUCAP 212 (H) 09/04/2017   HGBA1C 10.3 (H) 09/03/2017    Review of Glycemic Control Results for [name], [name] (MRN [id number]) as of 09/04/2017 12:44  Ref. Range 09/03/2017 11:15 09/03/2017 16:51 09/03/2017 20:51 09/04/2017 07:37 09/04/2017 12:19  Glucose-Capillary Latest Ref Range: 65 - 99 mg/dL 371 (H) 293 (H) 393 (H) 274 (H) 212 (H)   Diabetes history: DM2 Outpatient Diabetes medications: Toujeo 42 units qd + Humalog 12 units tid Current orders for Inpatient glycemic control: Lantus 42 units + Novolog moderate correction tid   Inpatient Diabetes Program Recommendations:   -Add Novolog 5 units tid meal coverage if eats 50% -Add Novolog 0-5 units hs correction  A1c has increased from 8.8 07/10/17 to current 10.3.  Thank you, Nani Gasser. Lynnann Knudsen, RN, MSN, CDE  Diabetes Coordinator Inpatient Glycemic Control Team Team Pager [phone] (8am-5pm) 09/04/2017 12:46 PM

## 2017-09-04 NOTE — Progress Notes (Addendum)
OT Cancellation Note  Patient Details Name: [name] MRN: [id number] DOB: 1946/04/10   Cancelled Treatment:    Reason Eval/Treat Not Completed: Patient not medically ready. Pt with R LE DVT and potential PE. Heparin infusion begun 12/25 at 19:21 and need to hold OT evaluation until 24 hours from that time per protocol. Will check back next date to initiate evaluation.   Norman Herrlich, MS OTR/L  Pager: [name] 09/04/2017, 1:20 PM

## 2017-09-04 NOTE — Progress Notes (Signed)
PT Cancellation Note  Patient Details Name: CURRY SEEFELDT MRN: [id number] DOB: Oct 25, 1945   Cancelled Treatment:    Reason Eval/Treat Not Completed: Other (comment).  Very low BP and mult medical issues that create need to hold evaluation.  Will ck later as time and pt allow.   Ramond Dial 09/04/2017, 9:24 AM   Mee Hives, PT MS Acute Rehab Dept. Number: [place] and [place]

## 2017-09-04 NOTE — Consult Note (Addendum)
[place] Nurse wound consult note Pt is familiar to [place] team from several previous admissions. Reason for Consult: Consult requested for buttocks  Pt states he was frequently incontinent of urine prior to admission and skin was constantly moist to buttocks, he spends most of his time in a recliner at home.  He currently has a catheter to assist with containment of urine. Bilat buttocks are red, macerated, and have multiple partial and full thickness patchy areas of skin loss.  Appearance consistent with moisture associated skin damage. Afftected area is approx 20X20cm. The multiple open wounds are NOT pressure injuries; appearance is consistent with moisture and shear since they are red, moist, and have shaggy irregular edges.  All are 1X1X.2cm or less in size and are scatted across bilat buttocks. Abd and groin skin folds are red and macerated with partial thickness fissures related to moisture and possible candidiasis. Dressing procedure/placement/frequency: Barrier cream to buttocks and antifungal powder to skin folds to protect and promote healing.   Please re-consult if further assistance is needed.  Thank-you,  Julien Girt MSN, [place], [place], [place], [place]

## 2017-09-04 NOTE — Evaluation (Signed)
Physical Therapy Evaluation Patient Details Name: ARYE WEYENBERG MRN: [id number] DOB: [birth year] Today's Date: 09/04/2017   History of Present Illness  71 yo male who is resident of SNF was admitted for chest pain and chronic resp failure, atypical chest pain with RUE expansion.  AKI noted with hyperkalemia, chronic DVT but cleared by nursing for brief eval.  PMHx:  cellulitis, urinary catheter, CVAs, DM, diabetic neuropathy, CHF, CAD, R TKR, neurogenic bladder and syncopal episodes  Clinical Impression  Pt was seen with nursing approval for evaluation of mobility and attempt to transfer to chair for his meal, which pt was unable to do.  He is weak and in pain from poor skin condition with skin folds containing macerated skin as well as perineum.  Pt is appropriate for PT in hosp to increase sitting tolerance and control, may benefit from a trapeze to get him repositioning and decreasing skin pressure.  Pt is negative about his SNF experience with regard to skin care but is also not cognitively able to give much information about his previous care.  Follow acutely for sitting balance and if possible to transition to the chair with nursing to instruct for his care.    Follow Up Recommendations SNF    Equipment Recommendations  None recommended by PT    Recommendations for Other Services       Precautions / Restrictions Precautions Precautions: Fall Precaution Comments: in bed at SNF Restrictions Weight Bearing Restrictions: No      Mobility  Bed Mobility Overal bed mobility: Needs Assistance Bed Mobility: Supine to Sit;Sit to Supine     Supine to sit: Mod assist;+2 for physical assistance;+2 for safety/equipment Sit to supine: Max assist;+2 for physical assistance;+2 for safety/equipment   General bed mobility comments: reminders for sequence and cued for initiation  Transfers                 General transfer comment: unable to attempt  Ambulation/Gait              General Gait Details: nonambulatory over a year  Stairs            Wheelchair Mobility    Modified Rankin (Stroke Patients Only) Modified Rankin (Stroke Patients Only) Pre-Morbid Rankin Score: Severe disability Modified Rankin: Severe disability     Balance Overall balance assessment: Needs assistance Sitting-balance support: Feet supported;Bilateral upper extremity supported(trunk supported by PT) Sitting balance-Leahy Scale: Poor                                       Pertinent Vitals/Pain Pain Assessment: Faces Faces Pain Scale: Hurts even more Pain Location: peri skin breakdown with movement Pain Descriptors / Indicators: Sore Pain Intervention(s): Monitored during session;Premedicated before session;Repositioned    Home Living Family/patient expects to be discharged to:: Skilled nursing facility                 Additional Comments: not in current PT    Prior Function Level of Independence: Needs assistance   Gait / Transfers Assistance Needed: non-ambulatory  ADL's / Homemaking Assistance Needed: has nursing assistance for all care        Hand Dominance   Dominant Hand: Right    Extremity/Trunk Assessment   Upper Extremity Assessment Upper Extremity Assessment: Generalized weakness    Lower Extremity Assessment Lower Extremity Assessment: Generalized weakness    Cervical / Trunk Assessment Cervical / Trunk  Assessment: Normal  Communication   Communication: No difficulties  Cognition Arousal/Alertness: Awake/alert Behavior During Therapy: Flat affect Overall Cognitive Status: History of cognitive impairments - at baseline                                 General Comments: has difficulty with recent history information      General Comments      Exercises     Assessment/Plan    PT Assessment Patient needs continued PT services  PT Problem List Decreased strength;Decreased range of  motion;Decreased activity tolerance;Decreased balance;Decreased mobility;Decreased cognition;Decreased coordination;Decreased knowledge of use of DME;Decreased safety awareness;Cardiopulmonary status limiting activity;Obesity;Decreased skin integrity;Pain       PT Treatment Interventions DME instruction;Functional mobility training;Therapeutic activities;Therapeutic exercise;Balance training;Neuromuscular re-education;Patient/family education    PT Goals (Current goals can be found in the Care Plan section)  Acute Rehab PT Goals Patient Stated Goal: to get skin healed PT Goal Formulation: With patient Time For Goal Achievement: 09/18/17 Potential to Achieve Goals: Good    Frequency Min 2X/week   Barriers to discharge Other (comment)(has SNF environment for care followup)      Co-evaluation               AM-PAC PT "6 Clicks" Daily Activity  Outcome Measure Difficulty turning over in bed (including adjusting bedclothes, sheets and blankets)?: Unable Difficulty moving from lying on back to sitting on the side of the bed? : Unable Difficulty sitting down on and standing up from a chair with arms (e.g., wheelchair, bedside commode, etc,.)?: Unable Help needed moving to and from a bed to chair (including a wheelchair)?: Total Help needed walking in hospital room?: Total Help needed climbing 3-5 steps with a railing? : Total 6 Click Score: 6    End of Session Equipment Utilized During Treatment: Oxygen Activity Tolerance: Patient limited by fatigue;Treatment limited secondary to medical complications (Comment)(skin breakdown and pain) Patient left: in bed;with call bell/phone within reach;with bed alarm set;Other (comment)(bed adjusted for his meal) Nurse Communication: Mobility status PT Visit Diagnosis: Muscle weakness (generalized) (M62.81);Other abnormalities of gait and mobility (R26.89);Pain;Adult, failure to thrive (R62.7);Dizziness and giddiness (R42)(sitting BP was 145/74  and O2 sat 97%) Pain - part of body: (perineum)    Time: 7494-4967 PT Time Calculation (min) (ACUTE ONLY): 32 min   Charges:   PT Evaluation $PT Eval Moderate Complexity: 1 Mod PT Treatments $Therapeutic Activity: 8-22 mins   PT G Codes:   PT G-Codes **NOT FOR INPATIENT CLASS** Functional Assessment Tool Used: AM-PAC 6 Clicks Basic Mobility    Ramond Dial 09/04/2017, 1:37 PM   Mee Hives, PT MS Acute Rehab Dept. Number: [place] and [place]

## 2017-09-04 NOTE — Progress Notes (Signed)
ANTICOAGULATION CONSULT NOTE - F/u Consult  Pharmacy Consult for heparin Indication: DVT and possible pulmonary embolus  Allergies  Allergen Reactions  . Ace Inhibitors Swelling    Pt tolerates lisinopril  . Lipitor [Atorvastatin Calcium] Swelling  . Metformin And Related Swelling  . Morphine And Related Other (See Comments)    Sweating, feels like is "in rocky boat."  . Robaxin [Methocarbamol] Other (See Comments)    Feels like he is shaky   Patient Measurements: Height: 6\' 1"  (185.4 cm) Weight: (!) 363 lb 6.4 oz (164.8 kg) IBW/kg (Calculated) : 79.9 Heparin Dosing Weight: 119 kg  Vital Signs: Temp: 98.2 F (36.8 C) (12/26 0954) Temp Source: Oral (12/26 0954) BP: 126/57 (12/26 0954) Pulse Rate: 100 (12/26 0954)  Labs: Recent Labs    09/03/17 0339  09/03/17 0452 09/03/17 1613 09/03/17 1930 09/03/17 2155 09/04/17 0252 09/04/17 0940  HGB 13.1  --   --   --   --   --   --   --   HCT 39.1  --   --   --   --   --   --   --   PLT 266  --   --   --   --   --   --   --   HEPARINUNFRC  --   --   --   --   --   --  0.55 0.41  CREATININE  --   --  2.03*  --   --   --   --  1.03  TROPONINI  --    < > 0.03* <0.03 0.03* <0.03  --   --    < > = values in this interval not displayed.    Estimated Creatinine Clearance: 109 mL/min (by C-G formula based on SCr of 1.03 mg/dL).  Assessment: 75 yoM admitted with DVT and possible PE. Unable to obtain CTA with contrast d/t AKI.  -heparin is confirmed at goal on 2000 units/hr  Goal of Therapy:  Heparin level 0.3-0.7 units/ml Monitor platelets by anticoagulation protocol: Yes   Plan:  -Continue heparin gtt at 2000 units/hr -Daily heparin level and CBC -Will follow plans for oral anticoagulation (with weight of 164kg may need to consider coumadin if he can follow up with outpatient INRs)  Hildred Laser, Pharm D 09/04/2017 11:10 AM

## 2017-09-04 NOTE — Consult Note (Signed)
Cardiology Consultation:   Patient ID: LEOBARDO GRANLUND; [id number]; [birth year]   Admit date: 09/03/2017 Date of Consult: 09/04/2017  Primary Care Provider: Gildardo Cranker, DO Primary Cardiologist: Dr. Marlou Porch  Patient Profile:   [name] Chisholm is a 71 y.o. male with a hx of mild to moderate CAD, obesity, HTN, HLD DM with neuropathy and neurogenic bladder, OSA not on CPAP, syncope, sinus pause 2nd to OSA (no arrhtymias on monitor) prior low EF with subsequent normalization, CKD stage III  And DVT who is being seen today for the evaluation of Chest pain at the request of Dr. Algis Liming.   He has a history of coronary artery disease and initially underwent cardiac catheterization in 2013 by Dr. Marlou Porch.  He was felt to have severe diffuse non-revascularizable distal RCA disease with minimal disease in the left system.  He had normal LV function.  He develop recurrent chest pain leading to hospitalization in August 2016.  A Myoview stress test showed lateral ischemia with reduced LV function at 30%. He underwent repeat cardiac catheterization initially on May 02, 2015 by Dr. Gwenlyn Found via the right radial approach, which revealed an occluded PLA system of his RCA compared to his catheterization in 2013.   With the radial approach, he was unable to engage left coronary system.  The patient developed a catheter dissection at the ostium of the RCA with staining which spiraled into the mid vessel.  Although the initial intent was to stent this vessel the dissection ultimately closed spontaneously resulting in a patent RCA lumen with normal flow and as result the decision was made not to intervene.  He was brought back to the laboratory 2 days later and subsequent catheterization was performed via the femoral approach by Dr. Audelia Acton.  This revealed a healed RCA dissection with moderate stenosis distally with an occluded AV segment as was noted previously.  There was mild CAD involving the left coronary system with  40% stenoses in the first and second diagonal, and 20-30% proximal LAD stenoses.  The circumflex had minor luminal irregularities.  Medical therapy was recommended.  Last seen by Dr. Marlou Porch 12/2015. Last echo 05/2017 showed normal LVEF of 55-60%, grade 1 DD, mild AS.  Resident at [facility]. Used oxygen independently.    History of Present Illness:   Mr. Laventure had been having cough for past 3-4 weeks. Treated with abx with mild improvement. No regular exercise. She has intermittent chest pain for past few days. Worse with deep breath. Yesterday his chest pressure got worse. intermittently last for few hours. Radiating to Right shoulder. Given SL nitro x 3 at facility. His pain resolved sometime after arrival to ER. Similar to prior angina when he had cath. He has orthopnea and LE edema.   AT arrival K of 6.5 with Scr of 2.3. Given Kayexalate and IV hydration with normalization of labs. Chest x ray without acute cardiopulmonary disease.   LE doppler showed RLE popliteal vein obstruction either acute or chronic. Started on IV heparin.   Past Medical History:  Diagnosis Date  . Arthritis    "hands, ankles, knees" (11/10/2014)  . Bladder spasms   . CAD (coronary artery disease)   . CAD in native artery 09/15/2015  . CHF (congestive heart failure) ([place])   . Chronic indwelling Foley catheter   . Chronic lower back pain   . Depression   . Diabetic diarrhea ([place])   . Diabetic neuropathy, painful ([place])   . DVT (deep venous thrombosis) ([place]) 1980's  LLE  . GERD (gastroesophageal reflux disease)   . Hyperlipidemia   . Hypertension   . Neurogenic bladder   . Obese   . OSA (obstructive sleep apnea)    "they wanted me to wear a mask; I couldn't" (11/10/2014)  . Pneumonia 1999  . PONV (postoperative nausea and vomiting)   . Stroke [facility]) 10/2011; 06/2014   right hand numbness/notes 10/20/2011, pt not sure he had stroke in 2013; "right hand weaker and right face not quite right since" (11/10/2014)  .  Type II diabetes mellitus ([place])   . Uncontrolled type II diabetes with peripheral autonomic neuropathy ([place]) 05/09/2015    Past Surgical History:  Procedure Laterality Date  . CARDIAC CATHETERIZATION N/A 05/02/2015   Procedure: Left Heart Cath and Coronary Angiography;  Surgeon: Lorretta Harp, MD;  Location: [facility];  Service: Cardiovascular;  Laterality: N/A;  . CARDIAC CATHETERIZATION N/A 05/04/2015   Procedure: Left Heart Cath and Coronary Angiography;  Surgeon: Wellington Hampshire, MD;  Location: [facility];  Service: Cardiovascular;  Laterality: N/A;  . CARPAL TUNNEL RELEASE Bilateral ~ 2003-2004  . CHOLECYSTECTOMY OPEN  1970's?  Marland Kitchen GASTROPLASTY    . JOINT REPLACEMENT    . LEFT HEART CATHETERIZATION WITH CORONARY ANGIOGRAM N/A 10/24/2011   Procedure: LEFT HEART CATHETERIZATION WITH CORONARY ANGIOGRAM;  Surgeon: Candee Furbish, MD;  Location: [facility] CATH LAB;  Service: Cardiovascular;  Laterality: N/A;  right radial artery approach  . TOTAL KNEE ARTHROPLASTY Right 1990's     Inpatient Medications: Scheduled Meds: . amLODipine  5 mg Oral Daily  . aspirin EC  81 mg Oral Daily  . cephALEXin  500 mg Oral TID  . Chlorhexidine Gluconate Cloth  6 each Topical Q0600  . escitalopram  10 mg Oral Daily  . ezetimibe  10 mg Oral Daily  . folic acid  1 mg Oral Daily  . gabapentin  100 mg Oral BID  . guaiFENesin  600 mg Oral BID  . hydrOXYzine  25 mg Oral TID  . insulin aspart  0-15 Units Subcutaneous TID WC  . insulin glargine  42 Units Subcutaneous QHS  . methotrexate  10 mg Oral Q Wed  . metoprolol tartrate  12.5 mg Oral BID  . multivitamin with minerals  1 tablet Oral Q1500  . mupirocin ointment  1 application Nasal BID  . pantoprazole  40 mg Oral Daily  . predniSONE  10 mg Oral Q breakfast  . sodium polystyrene  30 g Oral Once  . torsemide  20 mg Oral Daily  . traZODone  50 mg Oral QHS   Continuous Infusions: . heparin 2,000 Units/hr (09/04/17 0528)   PRN  Meds: acetaminophen, albuterol, ALPRAZolam, diclofenac sodium, gi cocktail, ipratropium-albuterol, nitroGLYCERIN, ondansetron (ZOFRAN) IV  Allergies:    Allergies  Allergen Reactions  . Ace Inhibitors Swelling    Pt tolerates lisinopril  . Lipitor [Atorvastatin Calcium] Swelling  . Metformin And Related Swelling  . Morphine And Related Other (See Comments)    Sweating, feels like is "in rocky boat."  . Robaxin [Methocarbamol] Other (See Comments)    Feels like he is shaky    Social History:   Social History   Socioeconomic History  . Marital status: Widowed    Spouse name: Not on file  . Number of children: Not on file  . Years of education: Not on file  . Highest education level: Not on file  Social Needs  . Financial resource strain: Not on file  .  Food insecurity - worry: Not on file  . Food insecurity - inability: Not on file  . Transportation needs - medical: Not on file  . Transportation needs - non-medical: Not on file  Occupational History  . Occupation: Retired  Tobacco Use  . Smoking status: Former Smoker    Packs/day: 2.00    Years: 20.00    Pack years: 40.00    Types: Cigarettes    Last attempt to quit: 09/10/1976    Years since quitting: 41.0  . Smokeless tobacco: Never Used  Substance and Sexual Activity  . Alcohol use: No    Alcohol/week: 0.0 oz    Comment: FORMER ALCOHOLIC; "sober since 79/39/0300"  . Drug use: No  . Sexual activity: No  Other Topics Concern  . Not on file  Social History Narrative  . Not on file    Family History:    Family History  Problem Relation Age of Onset  . Diabetes type I Father   . Hypertension Mother   . Cancer Brother         Testicular Cancer  . Cancer Brother        Leukemia     ROS:  Please see the history of present illness.  ROS All other ROS reviewed and negative.     Physical Exam/Data:   Vitals:   09/04/17 0414 09/04/17 0900 09/04/17 0954 09/04/17 1300  BP: (!) 98/42 (!) 143/63 (!) 126/57 (!)  145/74  Pulse: 92 (!) 102 100 (!) 102  Resp: 18 18 18 18   Temp: 97.8 F (36.6 C) 98.2 F (36.8 C) 98.2 F (36.8 C)   TempSrc: Oral Oral Oral   SpO2: 98% 96% 99% 97%  Weight: (!) 363 lb 6.4 oz (164.8 kg)     Height:        Intake/Output Summary (Last 24 hours) at 09/04/2017 1316 Last data filed at 09/04/2017 0811 Gross per 24 hour  Intake 1002.17 ml  Output 1200 ml  Net -197.83 ml   Filed Weights   09/03/17 0901 09/04/17 0414  Weight: (!) 362 lb 7 oz (164.4 kg) (!) 363 lb 6.4 oz (164.8 kg)   Body mass index is 47.94 kg/m.  General:   Morbidly obese male in no acute distress HEENT: normal Lymph: no adenopathy Neck: JVD difficult to assess due to grith Endocrine:  No thryomegaly Vascular: No carotid bruits; FA pulses 2+ bilaterally without bruits  Cardiac:  normal S1, S2; RRR; Systolic  murmur  Lungs:  clear to auscultation bilaterally, no wheezing, rhonchi or rales  Abd: soft, nontender, no hepatomegaly  Ext: 1 -2 + BL edema Musculoskeletal:  No deformities, BUE and BLE strength normal and equal Skin: warm and dry with peeling off. Hyperpigmentation bilaterally legs Neuro:  CNs 2-12 intact, no focal abnormalities noted Psych:  Normal affect   EKG:  The EKG was personally reviewed and demonstrates:  SR with prolonged PR interval  Telemetry:  Telemetry was personally reviewed and demonstrates:  Sinus rhythm at rate of 90-100s  Relevant CV Studies: Cardiac catheterization 05/11/2015  RCA dissection which spontaneously resolved.      Coronary Findings      Dominance: Right        Right Coronary Artery   . Dist RCA lesion, 60% stenosed.          Repeat Cath 05/11/15: Left ventriculography: Left ventricular systolic function is moderately reduced , LVEF is estimated at 35-40 %, there is no significant mitral regurgitation   Final Conclusions:  1. 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 There is  moderate stenosis distally with occluded AV segment as documented before. Otherwise, there is mild disease affecting the LAD. 2. Moderately reduced LV systolic function with an ejection fraction of 35-40% due to nonischemic cardiomyopathy. 3. Mild gradient across the aortic valve suggestive of mild stenosis. Mildly elevated left ventricular end-diastolic pressure.   Recommendations: No revascularization is recommended. Continue medical therapy for nonobstructive coronary artery disease and cardiomyopathy.    Echo 05/31/17 Study Conclusions  - Left ventricle: The cavity size was normal. Wall thickness was   increased in a pattern of mild LVH. Systolic function was normal.   The estimated ejection fraction was in the range of 55% to 60%.   Doppler parameters are consistent with abnormal left ventricular   relaxation (grade 1 diastolic dysfunction). - Aortic valve: There was mild stenosis. There was trivial   regurgitation. Valve area (VTI): 1.54 cm^2. Valve area (Vmax):   1.23 cm^2. Valve area (Vmean): 1.23 cm^2. - Left atrium: The atrium was moderately dilated. - Atrial septum: No defect or patent foramen ovale was identified.  Impressions:  - No cardiac source of emboli was indentified. Laboratory Data:  Chemistry Recent Labs  Lab 09/03/17 0452 09/03/17 1216 09/04/17 0940  NA 131*  --  134*  K 6.5* 6.0* 4.2  CL 99*  --  99*  CO2 21*  --  22  GLUCOSE 307*  --  283*  BUN 23*  --  19  CREATININE 2.03*  --  1.03  CALCIUM 8.5*  --  8.2*  GFRNONAA 32*  --  >60  GFRAA 37*  --  >60  ANIONGAP 11  --  13    No results for input(s): PROT, ALBUMIN, AST, ALT, ALKPHOS, BILITOT in the last 168 hours. Hematology Recent Labs  Lab 09/03/17 0339  WBC 11.8*  RBC 4.36  HGB 13.1  HCT 39.1  MCV 89.7  MCH 30.0  MCHC 33.5  RDW 15.7*  PLT 266   Cardiac Enzymes Recent Labs  Lab 09/03/17 0452 09/03/17 1613 09/03/17 1930 09/03/17 2155  TROPONINI 0.03* <0.03 0.03* <0.03    No results for input(s): TROPIPOC in the last 168 hours.  BNP Recent Labs  Lab 09/03/17 0452  BNP 100.7*    Radiology/Studies:  Dg Chest 2 View  Result Date: 09/03/2017 CLINICAL DATA:  71 year old male with shortness of breath. EXAM: CHEST  2 VIEW COMPARISON:  Chest radiograph dated 06/01/2016 FINDINGS: Shallow inspiration. No focal consolidation, pleural effusion, or pneumothorax. Stable top-normal cardiac size. No acute osseous pathology. IMPRESSION: No active cardiopulmonary disease. Electronically Signed   By: Anner Crete M.D.   On: 09/03/2017 03:45    Assessment and Plan:   1. Chest pressure - Both typical and atypical features. Somewhat pleuritic. Troponin flat mild elevation. Not in ACE pattern.  - Pain resolved after SL nitro. Continue ASA. Allergic to statin.  05/07/2017: Cholesterol 190; HDL 38; LDL Cholesterol 92; Triglycerides 299  - No ischemic work up planned this admission. Treat as PE.   2. CAD - mild to moderate as noted above by cath in 2016  3. HTN - Stable on current medications  4. DVT -LE doppler showed RLE popliteal vein obstruction either acute or chronic. On IV heparin. Can not r/o PE given his presentation. Pending echo this admission.   5. Acute on CKD stage III - Scr back to normal with hydration  6.  Chronic diastolic CHF - Volume status stable. Continue home Torsemide.   7. DM - A1c of 10.3 this admission. It was 8.8 1 month ago. Per primary   8. Morbid obesity - Body mass index is 47.94 kg/m.  - This is main issue. He need to loose weight with regular exercise and heart healthy diet.   For questions or updates, please contact [place] Please consult [email or web address] for contact info under Cardiology/STEMI.   Jarrett Soho, PA  09/04/2017 1:16 PM   Personally seen and examined. Agree with above.  71 year old male with multiple medical comorbidities including morbid obesity, debility/chronic immobility, uncontrolled  diabetes with hypertension, acute kidney injury resolved after hydration with newly discovered popliteal DVT unknown chronicity with atypical pleuritic type chest discomfort.  Prior CAD noted on catheterization as above moderate lesion no PCI.  EF has fluctuated from 40% to normal.  EKG shows first-degree AV block with no other significant abnormalities.  Exam: Morbidly obese male in bed dry skin especially lower extremities, jovial at times.  Regular rate and rhythm no murmurs, lungs clear but distant breath sounds abdomen obese positive bowel sounds skin dry peeling lower extremities with chronic appearing pigmented changes.  Assessment/plan: Atypical chest pain/popliteal DVT/diabetes with hypertension/morbid obesity/CAD nonobstructive - Echocardiogram about to be performed. -Treat popliteal DVT.  Recommend Coumadin given his morbid obesity. -Treating his DVT will also treat empirically for potential pulmonary embolism.  His chest pain is quite pleuritic in nature and may represent PE.  Treat regardless. - First-degree AV block prohibits increasing beta-blocker. -Continue with encouraging weight loss, movement. -No further cardiac testing at this time.  Candee Furbish, MD

## 2017-09-04 NOTE — CV Procedure (Signed)
2D echo attempted but pt eating, try later per physical therapy

## 2017-09-04 NOTE — Progress Notes (Addendum)
PROGRESS NOTE   [name]  CWU:[id number]    DOB: 01/04/1946    DOA: 09/03/2017  PCP: Gildardo Cranker, DO   I have briefly reviewed patients previous medical records in [facility].  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 Also reports Central pressure like chest pain, "feels like somebody standing on my chest" relieved by NTG the first time but not subsequently. Admitted for further evaluation of chest pain and dyspnea.   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 Acute purulent bronchitis: Chest x-ray without acute findings on exam. Has had symptoms for 3-4 weeks treated with antibiotics but worsen prior to admission. Currently on oral cephalexin started for recent UTI. Discussed with admitting  physician 2. Acute kidney injury complicating stage III chronic kidney disease: Baseline creatinine 0.8. Presented with creatinine of 2.03. May be related to poor oral intake. Acute kidney injury resolved. 3. Right popliteal vein DVT: Noted venous Doppler report. Discussed with Dr. Oneida Alar, vascular surgery. Unable to comment whether this is acute or chronic (age indeterminate) and recommend 6 months of anticoagulation if no contraindications. Currently on IV heparin. 4. Hyperkalemia: Presented with potassium of 6.5. Resolved. 5. Hypomagnesemia. Replace and follow. 6. Chest pain: Some typical and atypical features for CAD. Patient also has lower extremity DVT of undetermined acuity. Unable to do CTA chest on admission due to acute kidney injury. Check VQ scan. Troponin unremarkable. Cardiology consulted for assistance. 7. ? Acute on chronic respiratory failure with hypoxia: Reports using when necessary oxygen at nursing facility. Currently not hypoxic on room air. 8. Essential hypertension: Controlled. Continue metoprolol and amlodipine. 9. Uncontrolled DM 2: A1c 10.3. CBG is uncontrolled in the 270s. Continue Lantus. At bedtime sliding scale and mealtime coverage. Diabetes coordinator input appreciated. 10. Chronic diastolic CHF: Not overtly volume overloaded. 2-D echo last showed EF 55-60 percent and grade 1 diastolic dysfunction. Continue torsemide. 11. Hyperlipidemia: Continue Zetia. 12. Recent UTI: Complete course of Keflex. 13. Possible underlying COPD: Needs formal outpatient testing. 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 14. Bullous pemphigus: On chronic prednisone and methotrexate. 15. Anxiety and depression: Continue home regimen. 16. Deconditioning: Return to SNF when medically improved. 17. Morbid obesity/Body mass index is 47.94 kg/m. 18. OSA: Intolerant of CPAP.   DVT prophylaxis: Currently on IV heparin. Code Status: Full Family Communication: None at  bedside Disposition: DC back  to SNF when medically improved.   Consultants:  Cardiology   Procedures:  None  Antimicrobials:  Oral Keflex    Subjective: Cough and dyspnea better than when he initially came in. Breathing not yet back to baseline. Reports mid chest mild ongoing chest pain. Previously had chest pain radiating to right upper extremity.   ROS: Denies family history of heart disease.  Objective:  Vitals:   09/04/17 0414 09/04/17 0900 09/04/17 0954 09/04/17 1300  BP: (!) 98/42 (!) 143/63 (!) 126/57 (!) 145/74  Pulse: 92 (!) 102 100 (!) 102  Resp: 18 18 18 18   Temp: 97.8 F (36.6 C) 98.2 F (36.8 C) 98.2 F (36.8 C)   TempSrc: Oral Oral Oral   SpO2: 98% 96% 99% 97%  Weight: (!) 164.8 kg (363 lb 6.4 oz)     Height:        Examination:  General exam: Middle-aged male, moderately built and obese, lying comfortably supine in bed. Respiratory system: Clear to auscultation. Respiratory effort normal. Cardiovascular system: S1 & S2 heard, RRR. No JVD, murmurs, rubs, gallops or clicks. Trace bilateral leg edema. Telemetry: Presented with sinus tachycardia in the 140s. Now in sinus rhythm. Gastrointestinal system: Abdomen is nondistended, soft and nontender. No organomegaly or masses felt. Normal bowel sounds heard. Central nervous system: Alert and oriented. No focal neurological deficits. Extremities: Symmetric 5 x 5 power. Skin: No rashes, lesions or ulcers Psychiatry: Judgement and insight appear impaired. Mood & affect flat.     Data Reviewed: I have personally reviewed following labs and imaging studies  CBC: Recent Labs  Lab 09/03/17 0339  WBC 11.8*  HGB 13.1  HCT 39.1  MCV 89.7  PLT 419   Basic Metabolic Panel: Recent Labs  Lab 09/03/17 0452 09/03/17 1216 09/04/17 0940  NA 131*  --  134*  K 6.5* 6.0* 4.2  CL 99*  --  99*  CO2 21*  --  22  GLUCOSE 307*  --  283*  BUN 23*  --  19  CREATININE 2.03*  --  1.03  CALCIUM 8.5*  --  8.2*  MG   --  1.6*  --    Cardiac Enzymes: Recent Labs  Lab 09/03/17 0452 09/03/17 1613 09/03/17 1930 09/03/17 2155  TROPONINI 0.03* <0.03 0.03* <0.03   HbA1C: Recent Labs    09/03/17 0913  HGBA1C 10.3*   CBG: Recent Labs  Lab 09/03/17 1115 09/03/17 1651 09/03/17 2051 09/04/17 0737 09/04/17 1219  GLUCAP 371* 293* 393* 274* 212*    Recent Results (from the past 240 hour(s))  MRSA PCR Screening     Status: Abnormal   Collection Time: 09/03/17 11:50 AM  Result Value Ref Range Status   MRSA by PCR POSITIVE (A) NEGATIVE Final    Comment:        The GeneXpert MRSA Assay (FDA approved for NASAL specimens only), is one component of a comprehensive MRSA colonization surveillance program. It is not intended to diagnose MRSA infection nor to guide or monitor treatment for MRSA infections. RESULT CALLED TO, READ BACK BY AND VERIFIED WITH: Waymon Budge RN 14:05 09/03/17 ([email or web address])          Radiology Studies: Dg Chest 2 View  Result Date: 09/03/2017 CLINICAL DATA:  71 year old male with shortness of breath. EXAM: CHEST  2 VIEW COMPARISON:  Chest radiograph dated 06/01/2016 FINDINGS: Shallow inspiration. No focal consolidation, pleural effusion, or pneumothorax. Stable top-normal cardiac size. No acute osseous pathology. IMPRESSION: No active cardiopulmonary disease. Electronically Signed  By: Anner Crete M.D.   On: 09/03/2017 03:45        Scheduled Meds: . amLODipine  5 mg Oral Daily  . aspirin EC  81 mg Oral Daily  . cephALEXin  500 mg Oral TID  . Chlorhexidine Gluconate Cloth  6 each Topical Q0600  . escitalopram  10 mg Oral Daily  . ezetimibe  10 mg Oral Daily  . folic acid  1 mg Oral Daily  . gabapentin  100 mg Oral BID  . guaiFENesin  600 mg Oral BID  . hydrOXYzine  25 mg Oral TID  . insulin aspart  0-15 Units Subcutaneous TID WC  . insulin glargine  42 Units Subcutaneous QHS  . methotrexate  10 mg Oral Q Wed  . metoprolol tartrate  12.5 mg Oral BID  .  multivitamin with minerals  1 tablet Oral Q1500  . mupirocin ointment  1 application Nasal BID  . pantoprazole  40 mg Oral Daily  . predniSONE  10 mg Oral Q breakfast  . sodium polystyrene  30 g Oral Once  . torsemide  20 mg Oral Daily  . traZODone  50 mg Oral QHS   Continuous Infusions: . heparin 2,000 Units/hr (09/04/17 0528)     LOS: 1 day     Vernell Leep, MD, FACP, [facility]. [facility] Pager [phone]  If 7PM-7AM, please contact night-coverage [email or web address] Password [email or web address] 09/04/2017, 1:17 PM

## 2017-09-04 NOTE — Progress Notes (Signed)
  Echocardiogram 2D Echocardiogram has been performed.  [name] 09/04/2017, 3:01 PM

## 2017-09-05 ENCOUNTER — Other Ambulatory Visit: Payer: Self-pay

## 2017-09-05 ENCOUNTER — Inpatient Hospital Stay (HOSPITAL_COMMUNITY): Payer: Medicare Other

## 2017-09-05 DIAGNOSIS — R079 Chest pain, unspecified: Secondary | ICD-10-CM

## 2017-09-05 DIAGNOSIS — I82401 Acute embolism and thrombosis of unspecified deep veins of right lower extremity: Secondary | ICD-10-CM

## 2017-09-05 DIAGNOSIS — I2699 Other pulmonary embolism without acute cor pulmonale: Principal | ICD-10-CM

## 2017-09-05 LAB — CBC
HCT: 38.9 % — ABNORMAL LOW (ref 39.0–52.0)
Hemoglobin: 12.8 g/dL — ABNORMAL LOW (ref 13.0–17.0)
MCH: 30 pg (ref 26.0–34.0)
MCHC: 32.9 g/dL (ref 30.0–36.0)
MCV: 91.3 fL (ref 78.0–100.0)
Platelets: 198 10*3/uL (ref 150–400)
RBC: 4.26 MIL/uL (ref 4.22–5.81)
RDW: 16 % — AB (ref 11.5–15.5)
WBC: 12.3 10*3/uL — ABNORMAL HIGH (ref 4.0–10.5)

## 2017-09-05 LAB — PROTIME-INR
INR: 0.99
PROTHROMBIN TIME: 13 s (ref 11.4–15.2)

## 2017-09-05 LAB — BASIC METABOLIC PANEL
Anion gap: 10 (ref 5–15)
BUN: 13 mg/dL (ref 6–20)
CALCIUM: 8.8 mg/dL — AB (ref 8.9–10.3)
CO2: 27 mmol/L (ref 22–32)
CREATININE: 0.94 mg/dL (ref 0.61–1.24)
Chloride: 101 mmol/L (ref 101–111)
GFR calc Af Amer: >60 mL/min (ref >60)
GLUCOSE: 137 mg/dL — AB (ref 65–99)
Potassium: 3.8 mmol/L (ref 3.5–5.1)
Sodium: 138 mmol/L (ref 135–145)

## 2017-09-05 LAB — HEPARIN LEVEL (UNFRACTIONATED): HEPARIN UNFRACTIONATED: 0.39 [IU]/mL (ref 0.30–0.70)

## 2017-09-05 LAB — GLUCOSE, CAPILLARY
GLUCOSE-CAPILLARY: 131 mg/dL — AB (ref 65–99)
GLUCOSE-CAPILLARY: 127 mg/dL — AB (ref 65–99)
GLUCOSE-CAPILLARY: 211 mg/dL — AB (ref 65–99)
GLUCOSE-CAPILLARY: 221 mg/dL — AB (ref 65–99)
Glucose-Capillary: 139 mg/dL — ABNORMAL HIGH (ref 65–99)
Glucose-Capillary: 276 mg/dL — ABNORMAL HIGH (ref 65–99)

## 2017-09-05 LAB — BRAIN NATRIURETIC PEPTIDE: B NATRIURETIC PEPTIDE 5: 175.2 pg/mL — AB (ref 0.0–100.0)

## 2017-09-05 LAB — TROPONIN I: Troponin I: <0.03 ng/mL (ref <0.03)

## 2017-09-05 LAB — MAGNESIUM: MAGNESIUM: 1.7 mg/dL (ref 1.7–2.4)

## 2017-09-05 MED ORDER — PNEUMOCOCCAL VAC POLYVALENT 25 MCG/0.5ML IJ INJ
0.5000 mL | INJECTION | INTRAMUSCULAR | Status: DC
  Filled 2017-09-05: qty 0.5

## 2017-09-05 MED ORDER — MORPHINE SULFATE (PF) 2 MG/ML IV SOLN
1.0000 mg | INTRAVENOUS | Status: DC | PRN
  Administered 2017-09-05: 1 mg via INTRAVENOUS
  Filled 2017-09-05: qty 1

## 2017-09-05 MED ORDER — WARFARIN SODIUM 7.5 MG PO TABS
7.5000 mg | ORAL_TABLET | Freq: Once | ORAL | Status: AC
  Administered 2017-09-05: 7.5 mg via ORAL
  Filled 2017-09-05: qty 1

## 2017-09-05 MED ORDER — WARFARIN - PHARMACIST DOSING INPATIENT: Freq: Every day | Status: DC

## 2017-09-05 MED ORDER — ALUM & MAG HYDROXIDE-SIMETH 200-200-20 MG/5ML PO SUSP
30.0000 mL | Freq: Four times a day (QID) | ORAL | Status: DC | PRN
  Filled 2017-09-05: qty 30

## 2017-09-05 MED ORDER — PANTOPRAZOLE SODIUM 40 MG PO TBEC
40.0000 mg | DELAYED_RELEASE_TABLET | Freq: Two times a day (BID) | ORAL | Status: DC
  Administered 2017-09-05 – 2017-09-08 (×6): 40 mg via ORAL
  Filled 2017-09-05 (×6): qty 1

## 2017-09-05 MED ORDER — GI COCKTAIL ~~LOC~~
30.0000 mL | Freq: Once | ORAL | Status: AC
  Administered 2017-09-05: 30 mL via ORAL
  Filled 2017-09-05: qty 30

## 2017-09-05 NOTE — Plan of Care (Signed)
  Progressing Education: Knowledge of General Education information will improve 09/05/2017 1110 - Progressing by Harlin Heys, RN Health Behavior/Discharge Planning: Ability to manage health-related needs will improve 09/05/2017 1110 - Progressing by Harlin Heys, RN Clinical Measurements: Ability to maintain clinical measurements within normal limits will improve 09/05/2017 1110 - Progressing by Harlin Heys, RN Will remain free from infection 09/05/2017 1110 - Progressing by Harlin Heys, RN Diagnostic test results will improve 09/05/2017 1110 - Progressing by Harlin Heys, RN Respiratory complications will improve 09/05/2017 1110 - Progressing by Harlin Heys, RN Cardiovascular complication will be avoided 09/05/2017 1110 - Progressing by Harlin Heys, RN Activity: Risk for activity intolerance will decrease 09/05/2017 1110 - Progressing by Harlin Heys, RN Nutrition: Adequate nutrition will be maintained 09/05/2017 1110 - Progressing by Harlin Heys, RN Elimination: Will not experience complications related to bowel motility 09/05/2017 1110 - Progressing by Harlin Heys, RN Will not experience complications related to urinary retention 09/05/2017 1110 - Progressing by Harlin Heys, RN Pain Managment: General experience of comfort will improve 09/05/2017 1110 - Progressing by Harlin Heys, RN Safety: Ability to remain free from injury will improve 09/05/2017 1110 - Progressing by Harlin Heys, RN Skin Integrity: Risk for impaired skin integrity will decrease 09/05/2017 1110 - Progressing by Harlin Heys, RN

## 2017-09-05 NOTE — Plan of Care (Signed)
  Education: Knowledge of General Education information will improve 09/05/2017 0219 - Progressing by Tristan Schroeder, RN   Health Behavior/Discharge Planning: Ability to manage health-related needs will improve 09/05/2017 0219 - Progressing by Tristan Schroeder, RN

## 2017-09-05 NOTE — Progress Notes (Signed)
OT Cancellation Note  Patient Details Name: [name] MRN: [id number] DOB: 19-Sep-1945   Cancelled Treatment:    Reason Eval/Treat Not Completed: Other (comment) Pt is Medicare and current D/C plan is to return to SNF. No apparent immediate acute care OT needs, therefore will defer OT to SNF. If OT eval is needed please call Acute Rehab Dept. at [phone] or text page OT at [phone].   [place], OT/L  [phone] 09/05/2017 09/05/2017, 8:54 AM

## 2017-09-05 NOTE — Progress Notes (Addendum)
ANTICOAGULATION CONSULT NOTE - F/u Consult  Pharmacy Consult for heparin Indication: DVT and possible pulmonary embolus  Allergies  Allergen Reactions  . Ace Inhibitors Swelling    Pt tolerates lisinopril  . Lipitor [Atorvastatin Calcium] Swelling  . Metformin And Related Swelling  . Morphine And Related Other (See Comments)    Sweating, feels like is "in rocky boat."  . Robaxin [Methocarbamol] Other (See Comments)    Feels like he is shaky   Patient Measurements: Height: 6\' 1"  (185.4 cm) Weight: (!) 350 lb (158.8 kg) IBW/kg (Calculated) : 79.9 Heparin Dosing Weight: 119 kg  Vital Signs: Temp: 98.7 F (37.1 C) (12/27 0530) Temp Source: Oral (12/27 0530) BP: 123/67 (12/27 0802) Pulse Rate: 121 (12/27 0749)  Labs: Recent Labs    09/03/17 0339  09/03/17 0452 09/03/17 1613 09/03/17 1930 09/03/17 2155 09/04/17 0252 09/04/17 0940 09/05/17 0636  HGB 13.1  --   --   --   --   --   --   --  12.8*  HCT 39.1  --   --   --   --   --   --   --  38.9*  PLT 266  --   --   --   --   --   --   --  198  LABPROT  --   --   --   --   --   --   --   --  13.0  INR  --   --   --   --   --   --   --   --  0.99  HEPARINUNFRC  --   --   --   --   --   --  0.55 0.41 0.39  CREATININE  --   --  2.03*  --   --   --   --  1.03 0.94  TROPONINI  --    < > 0.03* <0.03 0.03* <0.03  --   --   --    < > = values in this interval not displayed.    Estimated Creatinine Clearance: 117 mL/min (by C-G formula based on SCr of 0.94 mg/dL).  Assessment: 63 yoM admitted with DVT and possible PE (VQ- Indeterminate to high probability PE). Plans for likely coumadin -INR= 0.99  -heparin is confirmed at goal on 2000 units/hr  Goal of Therapy:  Heparin level 0.3-0.7 units/ml Monitor platelets by anticoagulation protocol: Yes   Plan:  -Continue heparin gtt at 2000 units/hr -Daily heparin level and CBC -Will follow plans for oral anticoagulation  Hildred Laser, Pharm D 09/05/2017 8:11  AM   Addendum -adding coumadin (today is day 1 of overlap)  Plan -Coumadin 7.5mg  po today -Daily PT/INR -Discontinue aspirin with only mild CAD (Discussed with cardiology)  Hildred Laser, Pharm D 09/05/2017 1:50 PM

## 2017-09-05 NOTE — Progress Notes (Signed)
Pt came into hospital on 12/25 with yellow DNR form, it was previously scanned in computer under filed documents and checked "no expiration". Pt has been "Full Code" since 12/25 and after confirming with patient today he verbalizes he wants to stay a "Full Code" throughout this admission.

## 2017-09-05 NOTE — Progress Notes (Signed)
Spoke to NP- diet will not be changed tonight. Patient is aware and will speak to MD in the morning.

## 2017-09-05 NOTE — Progress Notes (Signed)
PROGRESS NOTE   [name]  XNA:[id number]    DOB: April 12, 1946    DOA: 09/03/2017  PCP: Gildardo Cranker, DO   I have briefly reviewed patients previous medical records in [facility].  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 Also reports Central pressure like chest pain, "feels like somebody standing on my chest" relieved by NTG the first time but not subsequently. Admitted for further evaluation of chest pain and dyspnea. Right lower extremity popliteal DVT, age indeterminate, VQ scan: Intermediate to high probability. Recurrent chest pain 12/27, negative EKG and troponin. Cardiology following.   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 Acute purulent bronchitis: Chest x-ray without acute findings on  exam. Has had symptoms for 3-4 weeks treated with antibiotics but worsened prior to admission. Currently on oral cephalexin started for recent UTI. Although chest x-ray suggests left base pneumonia, clinically not consistent with pneumonia. No productive cough, fevers and only mild leukocytosis. Follow clinically. 2. Acute kidney injury complicating stage III chronic kidney disease: Baseline creatinine 0.8. Presented with creatinine of 2.03. May be related to poor oral intake. Acute kidney injury resolved. 3. Right popliteal vein DVT/intermediate to high probability PE by VQ scan: Noted venous Doppler report. Discussed with Dr. Oneida Alar, Vascular surgery on 12/26 > Unable to comment whether this is acute or chronic (age indeterminate) and recommend 6 months of anticoagulation if no contraindications. Currently on IV heparin. Not good candidate for DOAC's due to morbid obesity. Discussed with cardiology and pharmacy, recommend Coumadin with IV heparin/Lovenox bridging. Continue IV heparin for another 24 hours due to chest pain this morning. 4. Hyperkalemia: Presented with potassium of 6.5. Resolved. 5. Hypomagnesemia. Replaced 6. Chest pain: Some typical and atypical features for CAD. Patient also has lower extremity DVT of undetermined acuity. Unable to do CTA chest on admission due to acute kidney injury. DD: PE versus GE versus cardiac (seems less likely). Repeat EKG and troponin this morning negative. Some response to GI cocktail. Added PPI. 7. ? Acute on chronic respiratory failure with hypoxia: Reports using when necessary oxygen at nursing facility. Currently not hypoxic on room air. 8. Essential hypertension: Controlled. Continue metoprolol and amlodipine. 9. Uncontrolled DM 2: A1c 10.3. CBG is uncontrolled in the 270s. Continue Lantus. Added bedtime sliding scale and mealtime coverage. Diabetes coordinator input appreciated. Better. Mildly uncontrolled and fluctuating. 10. Chronic diastolic  CHF: Not  overtly volume overloaded. 2-D echo last showed EF 55-60 percent and grade 1 diastolic dysfunction. Continue torsemide. 11. Hyperlipidemia: Continue Zetia. 12. Recent UTI: Complete course of Keflex. 13. Possible underlying COPD: Needs formal outpatient testing. 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 14. Bullous pemphigus: On chronic prednisone and methotrexate. 15. Anxiety and depression: Continue home regimen. 16. Deconditioning: Return to SNF when medically improved. 17. Morbid obesity/Body mass index is 46.18 kg/m. 18. OSA: Intolerant of CPAP.   DVT prophylaxis: Currently on IV heparin. Code Status: Full Family Communication: None at bedside Disposition: DC back to SNF when medically improved.   Consultants:  Cardiology   Procedures:  None  Antimicrobials:  Oral Keflex    Subjective: Seen early this morning. Reported 10/10 midsternal pressing chest pain "I feel like somebody standing on my chest", at times radiating to left upper extremity. No worsening dyspnea or diaphoresis. Interviewed and examined patient at bedside. Minimal relief with sublingual NTG 2. Some relief with IV morphine and then down to 3/10 after GI cocktail.  ROS: Denies family history of heart disease.  Objective:  Vitals:   09/05/17 0816 09/05/17 0824 09/05/17 1147 09/05/17 1252  BP: 117/69 116/74 (!) 115/53   Pulse:   92   Resp: 20 20  18   Temp:    98.8 F (37.1 C)  TempSrc:    Oral  SpO2: 94% 95% 96% 94%  Weight:      Height:        Examination:  General exam: Middle-aged male, moderately built and obese, lying supine in bed with some painful distress. Not diaphoretic. No respiratory distress. Respiratory system: Diminished breath sounds in the bases with occasional basal crackles but otherwise clear to auscultation. Respiratory effort normal. Cardiovascular system: S1 & S2 heard, RRR. No JVD, murmurs, rubs, gallops or clicks. Trace bilateral leg edema. Telemetry personally  reviewed: Sinus rhythm. Gastrointestinal system: Abdomen is nondistended, soft and nontender. No organomegaly or masses felt. Normal bowel sounds heard. Stable without change Central nervous system: Alert and oriented. No focal neurological deficits. Stable without change Extremities: Symmetric 5 x 5 power. Skin: No rashes, lesions or ulcers Psychiatry: Judgement and insight appear impaired. Mood & affect flat.     Data Reviewed: I have personally reviewed following labs and imaging studies  CBC: Recent Labs  Lab 09/03/17 0339 09/05/17 0636  WBC 11.8* 12.3*  HGB 13.1 12.8*  HCT 39.1 38.9*  MCV 89.7 91.3  PLT 266 812   Basic Metabolic Panel: Recent Labs  Lab 09/03/17 0452 09/03/17 1216 09/04/17 0940 09/05/17 0636  NA 131*  --  134* 138  K 6.5* 6.0* 4.2 3.8  CL 99*  --  99* 101  CO2 21*  --  22 27  GLUCOSE 307*  --  283* 137*  BUN 23*  --  19 13  CREATININE 2.03*  --  1.03 0.94  CALCIUM 8.5*  --  8.2* 8.8*  MG  --  1.6*  --  1.7   Cardiac Enzymes: Recent Labs  Lab 09/03/17 0452 09/03/17 1613 09/03/17 1930 09/03/17 2155 09/05/17 1147  TROPONINI 0.03* <0.03 0.03* <0.03 <0.03   HbA1C: Recent Labs    09/03/17 0913  HGBA1C 10.3*   CBG: Recent Labs  Lab 09/04/17 2117 09/05/17 0557 09/05/17 0724 09/05/17 0759 09/05/17 1146  GLUCAP 184* 139* 131* 127* 211*    Recent Results (from the past 240 hour(s))  MRSA PCR Screening     Status: Abnormal   Collection Time: 09/03/17  11:50 AM  Result Value Ref Range Status   MRSA by PCR POSITIVE (A) NEGATIVE Final    Comment:        The GeneXpert MRSA Assay (FDA approved for NASAL specimens only), is one component of a comprehensive MRSA colonization surveillance program. It is not intended to diagnose MRSA infection nor to guide or monitor treatment for MRSA infections. RESULT CALLED TO, READ BACK BY AND VERIFIED WITH: Waymon Budge RN 14:05 09/03/17 ([email or web address])          Radiology Studies: Dg Chest 2  View  Result Date: 09/05/2017 CLINICAL DATA:  Shortness of breath. History of congestive heart failure, diabetes, hypertension, CHF, former smoker. EXAM: CHEST  2 VIEW COMPARISON:  Chest x-ray of September 03, 2017 FINDINGS: The lungs remain mildly hypoinflated. There is density that projects at the lung bases on the lateral view new which is likely bilateral. There is no pleural effusion or pneumothorax. The heart is mildly enlarged. The pulmonary vascularity is not engorged. The bony thorax exhibits no acute abnormality. IMPRESSION: Mild hypoinflation. Increased basilar density greatest on the left posteriorly suspicious for pneumonia. Followup PA and lateral chest X-ray is recommended in 3-4 weeks following trial of antibiotic therapy to ensure resolution and exclude underlying malignancy. Mild cardiomegaly without pulmonary edema. Electronically Signed   By: David  Martinique M.D.   On: 09/05/2017 10:33   Nm Pulmonary Perf And Vent  Result Date: 09/04/2017 CLINICAL DATA:  Chest pain. EXAM: NUCLEAR MEDICINE VENTILATION - PERFUSION LUNG SCAN TECHNIQUE: Ventilation images were obtained in multiple projections using inhaled aerosol Tc-29m DTPA. Perfusion images were obtained in multiple projections after intravenous injection of Tc-71m MAA. RADIOPHARMACEUTICALS:  30.0 mCi Technetium-47m DTPA aerosol inhalation and 4.0 mCi Technetium-55m MAA IV COMPARISON:  Chest x-ray 09/03/2017 . FINDINGS: Multiple bilateral prominent perfusion defects noted. Poor ventilatory study, associated ventilation defects may be present. This study is indeterminate high probability pulmonary embolus. IMPRESSION: Indeterminate to high probability pulmonary embolus. Electronically Signed   By: Hobson   On: 09/04/2017 17:37        Scheduled Meds: . amLODipine  5 mg Oral Daily  . aspirin EC  81 mg Oral Daily  . cephALEXin  500 mg Oral TID  . Chlorhexidine Gluconate Cloth  6 each Topical Q0600  . escitalopram  10 mg Oral  Daily  . ezetimibe  10 mg Oral Daily  . folic acid  1 mg Oral Daily  . gabapentin  100 mg Oral BID  . guaiFENesin  600 mg Oral BID  . hydrOXYzine  25 mg Oral TID  . insulin aspart  0-15 Units Subcutaneous TID WC  . insulin aspart  0-5 Units Subcutaneous QHS  . insulin aspart  4 Units Subcutaneous TID WC  . insulin glargine  42 Units Subcutaneous QHS  . methotrexate  10 mg Oral Q Wed  . metoprolol tartrate  12.5 mg Oral BID  . multivitamin with minerals  1 tablet Oral Q1500  . mupirocin ointment  1 application Nasal BID  . pantoprazole  40 mg Oral Daily  . [START ON 09/06/2017] pneumococcal 23 valent vaccine  0.5 mL Intramuscular Tomorrow-1000  . predniSONE  10 mg Oral Q breakfast  . rOPINIRole  0.5 mg Oral QHS  . senna-docusate  2 tablet Oral QHS  . sodium polystyrene  30 g Oral Once  . torsemide  20 mg Oral Daily  . traZODone  50 mg Oral QHS   Continuous Infusions: . heparin 2,000 Units/hr (09/05/17 0739)  LOS: 2 days     Vernell Leep, MD, FACP, [facility]. [facility] Pager [phone]  If 7PM-7AM, please contact night-coverage [email or web address] Password [email or web address] 09/05/2017, 1:47 PM

## 2017-09-05 NOTE — Progress Notes (Signed)
Progress Note  Patient Name: [name] Date of Encounter: 09/05/2017  Primary Cardiologist: Marlou Porch   Subjective   CP earlier, helped a bit with GI coctail. Trop neg.    Inpatient Medications    Scheduled Meds: . amLODipine  5 mg Oral Daily  . aspirin EC  81 mg Oral Daily  . cephALEXin  500 mg Oral TID  . Chlorhexidine Gluconate Cloth  6 each Topical Q0600  . escitalopram  10 mg Oral Daily  . ezetimibe  10 mg Oral Daily  . folic acid  1 mg Oral Daily  . gabapentin  100 mg Oral BID  . guaiFENesin  600 mg Oral BID  . hydrOXYzine  25 mg Oral TID  . insulin aspart  0-15 Units Subcutaneous TID WC  . insulin aspart  0-5 Units Subcutaneous QHS  . insulin aspart  4 Units Subcutaneous TID WC  . insulin glargine  42 Units Subcutaneous QHS  . methotrexate  10 mg Oral Q Wed  . metoprolol tartrate  12.5 mg Oral BID  . multivitamin with minerals  1 tablet Oral Q1500  . mupirocin ointment  1 application Nasal BID  . pantoprazole  40 mg Oral Daily  . [START ON 09/06/2017] pneumococcal 23 valent vaccine  0.5 mL Intramuscular Tomorrow-1000  . predniSONE  10 mg Oral Q breakfast  . rOPINIRole  0.5 mg Oral QHS  . senna-docusate  2 tablet Oral QHS  . sodium polystyrene  30 g Oral Once  . torsemide  20 mg Oral Daily  . traZODone  50 mg Oral QHS   Continuous Infusions: . heparin 2,000 Units/hr (09/05/17 0739)   PRN Meds: acetaminophen, albuterol, ALPRAZolam, diclofenac sodium, ipratropium-albuterol, morphine injection, nitroGLYCERIN, ondansetron (ZOFRAN) IV   Vital Signs    Vitals:   09/05/17 0816 09/05/17 0824 09/05/17 1147 09/05/17 1252  BP: 117/69 116/74 (!) 115/53   Pulse:   92   Resp: 20 20  18   Temp:    98.8 F (37.1 C)  TempSrc:    Oral  SpO2: 94% 95% 96% 94%  Weight:      Height:        Intake/Output Summary (Last 24 hours) at 09/05/2017 1350 Last data filed at 09/05/2017 1255 Gross per 24 hour  Intake 1052.67 ml  Output 3700 ml  Net -2647.33 ml   Filed  Weights   09/03/17 0901 09/04/17 0414 09/05/17 0530  Weight: (!) 362 lb 7 oz (164.4 kg) (!) 363 lb 6.4 oz (164.8 kg) (!) 350 lb (158.8 kg)    Telemetry    No adverse arrhythmias- Personally Reviewed  ECG    No new EKG- Personally Reviewed  Physical Exam   GEN: No acute distress.  Morbidly obese in bed, Foley catheter indwelling Neck: No JVD Cardiac: RRR, no murmurs, rubs, or gallops.  Respiratory: Clear to auscultation bilaterally. GI: Soft, nontender, non-distended  MS:  Chronic lower extremity edema; No deformity. Neuro:  Nonfocal  Psych: Normal affect   Labs    Chemistry Recent Labs  Lab 09/03/17 0452 09/03/17 1216 09/04/17 0940 09/05/17 0636  NA 131*  --  134* 138  K 6.5* 6.0* 4.2 3.8  CL 99*  --  99* 101  CO2 21*  --  22 27  GLUCOSE 307*  --  283* 137*  BUN 23*  --  19 13  CREATININE 2.03*  --  1.03 0.94  CALCIUM 8.5*  --  8.2* 8.8*  GFRNONAA 32*  --  >60 >60  GFRAA 37*  --  >60 >60  ANIONGAP 11  --  13 10     Hematology Recent Labs  Lab 09/03/17 0339 09/05/17 0636  WBC 11.8* 12.3*  RBC 4.36 4.26  HGB 13.1 12.8*  HCT 39.1 38.9*  MCV 89.7 91.3  MCH 30.0 30.0  MCHC 33.5 32.9  RDW 15.7* 16.0*  PLT 266 198    Cardiac Enzymes Recent Labs  Lab 09/03/17 1613 09/03/17 1930 09/03/17 2155 09/05/17 1147  TROPONINI <0.03 0.03* <0.03 <0.03   No results for input(s): TROPIPOC in the last 168 hours.   BNP Recent Labs  Lab 09/03/17 0452 09/05/17 0636  BNP 100.7* 175.2*     DDimer No results for input(s): DDIMER in the last 168 hours.   Radiology    Dg Chest 2 View  Result Date: 09/05/2017 CLINICAL DATA:  Shortness of breath. History of congestive heart failure, diabetes, hypertension, CHF, former smoker. EXAM: CHEST  2 VIEW COMPARISON:  Chest x-ray of September 03, 2017 FINDINGS: The lungs remain mildly hypoinflated. There is density that projects at the lung bases on the lateral view new which is likely bilateral. There is no pleural  effusion or pneumothorax. The heart is mildly enlarged. The pulmonary vascularity is not engorged. The bony thorax exhibits no acute abnormality. IMPRESSION: Mild hypoinflation. Increased basilar density greatest on the left posteriorly suspicious for pneumonia. Followup PA and lateral chest X-ray is recommended in 3-4 weeks following trial of antibiotic therapy to ensure resolution and exclude underlying malignancy. Mild cardiomegaly without pulmonary edema. Electronically Signed   By: David  Martinique M.D.   On: 09/05/2017 10:33   Nm Pulmonary Perf And Vent  Result Date: 09/04/2017 CLINICAL DATA:  Chest pain. EXAM: NUCLEAR MEDICINE VENTILATION - PERFUSION LUNG SCAN TECHNIQUE: Ventilation images were obtained in multiple projections using inhaled aerosol Tc-67m DTPA. Perfusion images were obtained in multiple projections after intravenous injection of Tc-61m MAA. RADIOPHARMACEUTICALS:  30.0 mCi Technetium-68m DTPA aerosol inhalation and 4.0 mCi Technetium-33m MAA IV COMPARISON:  Chest x-ray 09/03/2017 . FINDINGS: Multiple bilateral prominent perfusion defects noted. Poor ventilatory study, associated ventilation defects may be present. This study is indeterminate high probability pulmonary embolus. IMPRESSION: Indeterminate to high probability pulmonary embolus. Electronically Signed   By: Marcello Moores  Register   On: 09/04/2017 17:37    Cardiac Studies   Echocardiogram 09/04/17: - Procedure narrative: Transthoracic echocardiography. Image   quality was suboptimal. The study was technically difficult, as a   result of poor acoustic windows, poor sound wave transmission,   poor patient compliance, restricted patient mobility, and body   habitus. Intravenous contrast (Definity) was administered. - Left ventricle: The cavity size was normal. Wall thickness was   increased in a pattern of mild LVH. Systolic function was normal.   The estimated ejection fraction was in the range of 50% to 55%.   Wall motion  was normal; there were no regional wall motion   abnormalities. Doppler parameters are consistent with restrictive   physiology, indicative of decreased left ventricular diastolic   compliance and/or increased left atrial pressure. - Aortic valve: Valve mobility was restricted. There was mild   stenosis. Valve area (VTI): 1.53 cm^2. Valve area (Vmax): 1.4   cm^2. Valve area (Vmean): 1.38 cm^2. - Aortic root: The aortic root was mildly dilated. - Mitral valve: Calcified annulus.  Impressions:  - Technically difficult; definity used; normal LV systolic   function; mild LVH; restrictive filling; calcified aortic valve   with mild ASl; mildly dilated aortic root.  Patient Profile     71 y.o. male chronically in bed with acute PE, DVT popliteal, chest pain  Assessment & Plan    Acute PE -VQ scan abnormal.  Popliteal DVT noted.  Treating with IV heparin.  I have written for Coumadin.  Appreciate pharmacy assistance.  Discussed with Dr. Algis Liming. -Certainly acute PE can cause the chest discomfort that he had.  There is also some question whether or not GI discomfort was present as well as his chest pain somewhat subsided after GI cocktail.  Thankfully, troponin is normal.  No further cardiac workup.  DVT popliteal question acute or chronic -Heparin and Coumadin.  Given his morbid obesity, not a good NOAC candidate  Morbid obesity -Continue to encourage weight loss, dietary caloric restriction.  Unfortunately he does not move that much.  We will sign off.  Please let us know we can be of further assistance.  For questions or updates, please contact [place] Please consult [email or web address] for contact info under Cardiology/STEMI.      Signed, Candee Furbish, MD  09/05/2017, 1:50 PM

## 2017-09-05 NOTE — Progress Notes (Signed)
Pt complaining of 8 out of 10 chest pain this AM that he describes as "feels like I got ran over by a truck". BP 118/42, pulse 89. Nitro sublingual given to patient per [facility] order, after 5 mins pain is unchanged. Paged Dr. Algis Liming with above. Will give another nitro and obtain an EKG now.

## 2017-09-05 NOTE — Progress Notes (Signed)
Patient asked RN to speak to his MD- RN asked if there was anything that she could do. Patient states that he no longer wants to be on a special diet. He reports that he is old enough to understand that he is a diabetic but that he is also 71 years old. Np to be notified.

## 2017-09-05 NOTE — Progress Notes (Signed)
Dr. Hongalgi at bedside  

## 2017-09-06 DIAGNOSIS — E1143 Type 2 diabetes mellitus with diabetic autonomic (poly)neuropathy: Secondary | ICD-10-CM

## 2017-09-06 DIAGNOSIS — L8990 Pressure ulcer of unspecified site, unspecified stage: Secondary | ICD-10-CM

## 2017-09-06 DIAGNOSIS — E1165 Type 2 diabetes mellitus with hyperglycemia: Secondary | ICD-10-CM

## 2017-09-06 LAB — CBC
HEMATOCRIT: 35.3 % — AB (ref 39.0–52.0)
HEMOGLOBIN: 11.5 g/dL — AB (ref 13.0–17.0)
MCH: 29.9 pg (ref 26.0–34.0)
MCHC: 32.6 g/dL (ref 30.0–36.0)
MCV: 91.7 fL (ref 78.0–100.0)
Platelets: 231 10*3/uL (ref 150–400)
RBC: 3.85 MIL/uL — ABNORMAL LOW (ref 4.22–5.81)
RDW: 16 % — AB (ref 11.5–15.5)
WBC: 10.4 10*3/uL (ref 4.0–10.5)

## 2017-09-06 LAB — GLUCOSE, CAPILLARY
GLUCOSE-CAPILLARY: 112 mg/dL — AB (ref 65–99)
GLUCOSE-CAPILLARY: 123 mg/dL — AB (ref 65–99)
GLUCOSE-CAPILLARY: 160 mg/dL — AB (ref 65–99)
Glucose-Capillary: 127 mg/dL — ABNORMAL HIGH (ref 65–99)
Glucose-Capillary: 215 mg/dL — ABNORMAL HIGH (ref 65–99)

## 2017-09-06 LAB — PROTIME-INR
INR: 1.18
Prothrombin Time: 14.9 seconds (ref 11.4–15.2)

## 2017-09-06 LAB — HEPARIN LEVEL (UNFRACTIONATED)
Heparin Unfractionated: 1.06 IU/mL — ABNORMAL HIGH (ref 0.30–0.70)
Heparin Unfractionated: 0.38 IU/mL (ref 0.30–0.70)

## 2017-09-06 LAB — TROPONIN I

## 2017-09-06 MED ORDER — WARFARIN SODIUM 7.5 MG PO TABS
7.5000 mg | ORAL_TABLET | Freq: Once | ORAL | Status: AC
Start: 2017-09-06 — End: 2017-09-06
  Administered 2017-09-06: 7.5 mg via ORAL
  Filled 2017-09-06: qty 1

## 2017-09-06 NOTE — Progress Notes (Signed)
Asked patient if he wanted a stat lock for his foley- he responded that it is almost time for it to be changed and he usually gets it changed monthly, so he isn't that concern about a stat lock right now.

## 2017-09-06 NOTE — Progress Notes (Signed)
Patient ID: [name], male   DOB: August 06, 1946, 71 y.o.   MRN: [id number]                                                                PROGRESS NOTE                                                                                                                                                                                                             Patient Demographics:    [name], is a 71 y.o. male, DOB - 07-01-1946, NOT:[id number]  Admit date - 09/03/2017   Admitting Physician Desiree Hane, MD  Outpatient Primary MD for the patient is Gildardo Cranker, DO  LOS - 3  Outpatient Specialists:    Chief Complaint  Patient presents with  . 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 Also reports Central pressure like chest pain, "feels like somebody standing on my chest" relieved by NTG the first time but not subsequently. Admitted for further evaluation of chest pain and dyspnea. Right lower extremity popliteal DVT, age indeterminate, VQ scan: Intermediate to high probability. Recurrent chest pain 12/27, negative EKG and troponin. Cardiology following.      Subjective:    [name] today states minimal chest discomfort.  No bleeding.  Slight cough, dry.  Afebrile. Denies palp, sob, n/v, diarrhea, brbpr, black stool.   No headache, No  abdominal pain , No new weakness tingling or numbness.  Pt requesting regular diet.    Assessment  & Plan :    Principal Problem:   Chest pain Active Problems:   Diabetic neuropathy (HCC)   Chronic indwelling Foley catheter   Chronic diastolic CHF (congestive heart failure) ([place])  Neurogenic bladder   Uncontrolled type II diabetes with peripheral autonomic neuropathy (HCC)   Morbid obesity (HCC)   CAD in native artery   GERD (gastroesophageal reflux disease)   Restless legs   Depression   H/O: CVA (cerebrovascular accident)   Hypertensive heart disease with CHF (congestive heart failure) ([place])   Dyslipidemia associated with type 2 diabetes mellitus (HCC)   Anemia   Hyperkalemia   Pressure injury of skin   1. Acute purulent bronchitis: Chest x-ray without acute findings on exam. Has had symptoms for 3-4 weeks treated with antibiotics but worsened prior to admission. Currently on oral cephalexin started for recent UTI. Although chest x-ray suggests left base pneumonia, clinically not consistent with pneumonia. No productive cough, fevers and only mild leukocytosis. Follow clinically. 2. Acute kidney injury complicating stage III chronic kidney disease: Baseline creatinine 0.8. Presented with creatinine of 2.03. May be related to poor oral intake. Acute kidney injury resolved. 3. Right popliteal vein DVT/intermediate to high probability PE by VQ scan: Noted venous Doppler report. Discussed with Dr. Oneida Alar, Vascular surgery on 12/26 > Unable to comment whether this is acute or chronic (age indeterminate) and recommend 6 months of anticoagulation if no contraindications. Currently on IV heparin. Not good candidate for DOAC's due to morbid obesity. Discussed with cardiology and pharmacy, recommend Coumadin with IV heparin/Lovenox bridging. Continue IV heparin til therapeutic INR  Coumadin pharmacy to dose 4. Hyperkalemia: Presented with potassium of 6.5. Resolved. 5. Hypomagnesemia.  Replaced 6. Chest pain: Some typical and atypical features for CAD. Patient also has lower extremity DVT of undetermined acuity. Unable to do CTA chest on admission due to acute kidney injury. DD: PE versus GE versus cardiac (seems less likely). Repeat EKG and troponin this morning negative. Some response to GI cocktail. Added PPI.  Appreciate cardiology input.  7. ? Acute on chronic respiratory failure with hypoxia: Reports using when necessary oxygen at nursing facility. Currently not hypoxic on room air. 8. Essential hypertension: Controlled. Continue metoprolol and amlodipine. 9. Uncontrolled DM 2: A1c 10.3. CBG is uncontrolled in the 270s. Continue Lantus. Added bedtime sliding scale and mealtime coverage. Diabetes coordinator input appreciated. Better. Mildly uncontrolled and fluctuating. 10. Chronic diastolic CHF: Not overtly volume overloaded. 2-D echo last showed EF 55-60 percent and grade 1 diastolic dysfunction. Continue torsemide. 11. Hyperlipidemia: Continue Zetia. 12. Recent UTI: Complete course of Keflex. 13. Possible underlying COPD: Needs formal outpatient testing. 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 14. Bullous pemphigus: On chronic prednisone and methotrexate. 15. Anxiety and depression: Continue home regimen. 16. Deconditioning: Return to SNF when medically improved. 17. Morbid obesity/Body mass index is 46.18 kg/m. 18. OSA: Intolerant of CPAP.   DVT prophylaxis: Currently on IV heparin. Code Status: Full Family Communication: None at bedside Disposition: DC back to SNF when medically improved.   Consultants:  Cardiology   Procedures:  None  Antimicrobials:  Oral Keflex        Lab Results  Component Value Date   PLT 231 09/06/2017    Antibiotics  :   keflex  Anti-infectives (From admission, onward)   Start     Dose/Rate Route Frequency Ordered Stop   09/03/17 1000  cephALEXin (KEFLEX) capsule 500 mg     500 mg Oral 3 times daily  09/03/17 0742          Objective:   Vitals:   09/05/17 1252 09/05/17 2131 09/06/17 0026 09/06/17 0500  BP:  123/66  (!) 122/47  Pulse:  95 (!) 101  85  Resp: 18 18 19 14   Temp: 98.8 F (37.1 C) 97.9 F (36.6 C) 97.9 F (36.6 C) 98.1 F (36.7 C)  TempSrc: Oral Oral Oral Oral  SpO2: 94% 94%  95%  Weight:    (!) 162 kg (357 lb 2.3 oz)  Height:        Wt Readings from Last 3 Encounters:  09/06/17 (!) 162 kg (357 lb 2.3 oz)  08/27/17 (!) 157.9 kg (348 lb 3.2 oz)  08/14/17 (!) 157.9 kg (348 lb 3.2 oz)     Intake/Output Summary (Last 24 hours) at 09/06/2017 0853 Last data filed at 09/06/2017 0533 Gross per 24 hour  Intake 295 ml  Output 2300 ml  Net -2005 ml     Physical Exam  Awake Alert, Oriented X 3, No new F.N deficits, Normal affect [place].AT,PERRAL Supple Neck,No JVD, No cervical lymphadenopathy appriciated.  Symmetrical Chest wall movement, Good air movement bilaterally, CTAB RRR,No Gallops,Rubs or new Murmurs, No Parasternal Heave +ve B.Sounds, Abd Soft, No tenderness, No organomegaly appriciated, No rebound - guarding or rigidity. No Cyanosis, Clubbing or edema, No new Rash or bruise    Morbid obeisity Chronic foley catheter   Data Review:    CBC Recent Labs  Lab 09/03/17 0339 09/05/17 0636 09/06/17 0734  WBC 11.8* 12.3* 10.4  HGB 13.1 12.8* 11.5*  HCT 39.1 38.9* 35.3*  PLT 266 198 231  MCV 89.7 91.3 91.7  MCH 30.0 30.0 29.9  MCHC 33.5 32.9 32.6  RDW 15.7* 16.0* 16.0*    Chemistries  Recent Labs  Lab 09/03/17 0452 09/03/17 1216 09/04/17 0940 09/05/17 0636  NA 131*  --  134* 138  K 6.5* 6.0* 4.2 3.8  CL 99*  --  99* 101  CO2 21*  --  22 27  GLUCOSE 307*  --  283* 137*  BUN 23*  --  19 13  CREATININE 2.03*  --  1.03 0.94  CALCIUM 8.5*  --  8.2* 8.8*  MG  --  1.6*  --  1.7   ------------------------------------------------------------------------------------------------------------------ No results for input(s): CHOL, HDL, LDLCALC,  TRIG, CHOLHDL, LDLDIRECT in the last 72 hours.  Lab Results  Component Value Date   HGBA1C 10.3 (H) 09/03/2017   ------------------------------------------------------------------------------------------------------------------ No results for input(s): TSH, T4TOTAL, T3FREE, THYROIDAB in the last 72 hours.  Invalid input(s): FREET3 ------------------------------------------------------------------------------------------------------------------ No results for input(s): VITAMINB12, FOLATE, FERRITIN, TIBC, IRON, RETICCTPCT in the last 72 hours.  Coagulation profile Recent Labs  Lab 09/05/17 0636 09/06/17 0734  INR 0.99 1.18    No results for input(s): DDIMER in the last 72 hours.  Cardiac Enzymes Recent Labs  Lab 09/05/17 1147 09/05/17 1819 09/05/17 2325  TROPONINI <0.03 <0.03 <0.03   ------------------------------------------------------------------------------------------------------------------    Component Value Date/Time   BNP 175.2 (H) 09/05/2017 0636    Inpatient Medications  Scheduled Meds: . amLODipine  5 mg Oral Daily  . cephALEXin  500 mg Oral TID  . Chlorhexidine Gluconate Cloth  6 each Topical Q0600  . escitalopram  10 mg Oral Daily  . ezetimibe  10 mg Oral Daily  . folic acid  1 mg Oral Daily  . gabapentin  100 mg Oral BID  . guaiFENesin  600 mg Oral BID  . hydrOXYzine  25 mg Oral TID  . insulin aspart  0-15 Units Subcutaneous TID WC  . insulin aspart  0-5 Units Subcutaneous QHS  . insulin aspart  4 Units Subcutaneous TID WC  . insulin glargine  42 Units Subcutaneous QHS  .  methotrexate  10 mg Oral Q Wed  . metoprolol tartrate  12.5 mg Oral BID  . multivitamin with minerals  1 tablet Oral Q1500  . mupirocin ointment  1 application Nasal BID  . pantoprazole  40 mg Oral BID  . pneumococcal 23 valent vaccine  0.5 mL Intramuscular Tomorrow-1000  . predniSONE  10 mg Oral Q breakfast  . rOPINIRole  0.5 mg Oral QHS  . senna-docusate  2 tablet Oral QHS   . torsemide  20 mg Oral Daily  . traZODone  50 mg Oral QHS  . Warfarin - Pharmacist Dosing Inpatient   Does not apply q1800   Continuous Infusions: . heparin 2,000 Units/hr (09/06/17 0835)   PRN Meds:.acetaminophen, albuterol, ALPRAZolam, alum & mag hydroxide-simeth, diclofenac sodium, ipratropium-albuterol, morphine injection, nitroGLYCERIN, ondansetron (ZOFRAN) IV  Micro Results Recent Results (from the past 240 hour(s))  MRSA PCR Screening     Status: Abnormal   Collection Time: 09/03/17 11:50 AM  Result Value Ref Range Status   MRSA by PCR POSITIVE (A) NEGATIVE Final    Comment:        The GeneXpert MRSA Assay (FDA approved for NASAL specimens only), is one component of a comprehensive MRSA colonization surveillance program. It is not intended to diagnose MRSA infection nor to guide or monitor treatment for MRSA infections. RESULT CALLED TO, READ BACK BY AND VERIFIED WITH: Waymon Budge RN 14:05 09/03/17 ([email or web address])     Radiology Reports Dg Chest 2 View  Result Date: 09/05/2017 CLINICAL DATA:  Shortness of breath. History of congestive heart failure, diabetes, hypertension, CHF, former smoker. EXAM: CHEST  2 VIEW COMPARISON:  Chest x-ray of September 03, 2017 FINDINGS: The lungs remain mildly hypoinflated. There is density that projects at the lung bases on the lateral view new which is likely bilateral. There is no pleural effusion or pneumothorax. The heart is mildly enlarged. The pulmonary vascularity is not engorged. The bony thorax exhibits no acute abnormality. IMPRESSION: Mild hypoinflation. Increased basilar density greatest on the left posteriorly suspicious for pneumonia. Followup PA and lateral chest X-ray is recommended in 3-4 weeks following trial of antibiotic therapy to ensure resolution and exclude underlying malignancy. Mild cardiomegaly without pulmonary edema. Electronically Signed   By: David  Martinique M.D.   On: 09/05/2017 10:33   Dg Chest 2 View  Result Date:  09/03/2017 CLINICAL DATA:  71 year old male with shortness of breath. EXAM: CHEST  2 VIEW COMPARISON:  Chest radiograph dated 06/01/2016 FINDINGS: Shallow inspiration. No focal consolidation, pleural effusion, or pneumothorax. Stable top-normal cardiac size. No acute osseous pathology. IMPRESSION: No active cardiopulmonary disease. Electronically Signed   By: Anner Crete M.D.   On: 09/03/2017 03:45   Nm Pulmonary Perf And Vent  Result Date: 09/04/2017 CLINICAL DATA:  Chest pain. EXAM: NUCLEAR MEDICINE VENTILATION - PERFUSION LUNG SCAN TECHNIQUE: Ventilation images were obtained in multiple projections using inhaled aerosol Tc-105m DTPA. Perfusion images were obtained in multiple projections after intravenous injection of Tc-18m MAA. RADIOPHARMACEUTICALS:  30.0 mCi Technetium-34m DTPA aerosol inhalation and 4.0 mCi Technetium-73m MAA IV COMPARISON:  Chest x-ray 09/03/2017 . FINDINGS: Multiple bilateral prominent perfusion defects noted. Poor ventilatory study, associated ventilation defects may be present. This study is indeterminate high probability pulmonary embolus. IMPRESSION: Indeterminate to high probability pulmonary embolus. Electronically Signed   By: Marcello Moores  Register   On: 09/04/2017 17:37    Time Spent in minutes  30   Jani Gravel M.D on 09/06/2017 at 8:53 AM  Between 7am to 7pm - Pager -  [phone]  After 7pm go to [email or web address] - password [facility]  [facility] -  Office  [phone]

## 2017-09-06 NOTE — Progress Notes (Signed)
Blood sugar was 160. Patient states that he feels better now that the air has been turned down and he can rest. Will continue to monitor

## 2017-09-06 NOTE — Progress Notes (Signed)
Pt is alert and oriented vomited 1 time and refused zofran stated that he felt better after.

## 2017-09-06 NOTE — NC FL2 (Signed)
[place] MEDICAID FL2 LEVEL OF CARE SCREENING TOOL     IDENTIFICATION  Patient Name: [name] Birthdate: 1946-04-17 Sex: male Admission Date (Current Location): 09/03/2017  [name] and [place] Number:  Herbalist and Address:  [facility] [employer], [place] [address], [place], [place] [address]      Provider Number: [id number]  Attending Physician Name and Address:  Jani Gravel, MD  Relative Name and Phone Number:       Current Level of Care: Hospital Recommended Level of Care: [place] Prior Approval Number:    Date Approved/Denied:   PASRR Number: [id number]  Discharge Plan: SNF    Current Diagnoses: Patient Active Problem List   Diagnosis Date Noted  . Pressure injury of skin 09/06/2017  . Chest pain 09/03/2017  . Hyperkalemia 09/03/2017  . Allergic rhinitis 08/19/2017  . Anemia 06/25/2017  . Cellulitis of left hip 05/27/2017  . Acute cystitis without hematuria   . Dyslipidemia associated with type 2 diabetes mellitus ([place]) 01/17/2017  . 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 OSA on CPAP 05/27/2016  . Hypertensive heart disease with CHF (congestive heart failure) ([place]) 04/15/2016  . H/O: CVA (cerebrovascular accident) 01/22/2016  . Sepsis ([place]) 01/11/2016  . Depression   . Syncope 11/01/2015  . GERD (gastroesophageal reflux disease) 10/22/2015  . Restless legs 10/22/2015  . CAD in native artery 09/15/2015  . Morbid obesity ([place]) 07/03/2015  . Uncontrolled type II diabetes with peripheral autonomic neuropathy ([place]) 05/09/2015  . Hyperlipidemia LDL goal <70 05/02/2015  . Neurogenic bladder 04/26/2015  . UTI (urinary tract infection) 04/14/2015  . Abdominal pain 06/25/2014  . Chronic diastolic CHF (congestive heart failure) ([place]) 04/01/2014  . Chronic indwelling Foley catheter 12/23/2013  . Hypokalemia 03/11/2012  . Diabetic neuropathy ([place]) 10/10/2011    Orientation RESPIRATION BLADDER Height & Weight     Self,  Time, Situation, Place  Normal, O2(Goes between room air and Nasal Canula 2 L.) Continent, Indwelling catheter Weight: (!) 357 lb 2.3 oz (162 kg) Height:  6\' 1"  (185.4 cm)  BEHAVIORAL SYMPTOMS/MOOD NEUROLOGICAL BOWEL NUTRITION STATUS  (Got agitated/combative last night (12/27)) (History of CVA) Incontinent Diet(Heart healthy)  AMBULATORY STATUS COMMUNICATION OF NEEDS Skin   Extensive Assist Verbally Other (Comment), PU Stage and Appropriate Care(MASD.)   PU Stage 2 Dressing: No Dressing(Left and right buttocks.)                   Personal Care Assistance Level of Assistance  Bathing, Feeding, Dressing Bathing Assistance: Limited assistance Feeding assistance: Independent Dressing Assistance: Limited assistance     Functional Limitations Info  Sight, Hearing, Speech Sight Info: Adequate Hearing Info: Adequate Speech Info: Adequate    SPECIAL CARE FACTORS FREQUENCY  PT (By licensed PT)     PT Frequency: 5 x week              Contractures Contractures Info: Not present    Additional Factors Info  Code Status, Allergies, Isolation Precautions, Psychotropic Code Status Info: DNR Allergies Info: Ace Inhibitors, Lipitor (Atorvastatin Calcium), Metformin and Related, Morphine and Related, Robaxin (Methocarbamol). Psychotropic Info: Depression: Lexapro 10 mg PO daily, Hydroxyzine 25 mg PO TID, Trazodone 50 mg PO QHS, Xanax 0.25 mg PO BID prn.   Isolation Precautions Info: Contact: OMDRO, ESBL, MRSA     Current Medications (09/06/2017):  This is the current hospital active medication list Current Facility-Administered Medications  Medication Dose Route Frequency Provider Last Rate Last Dose  . acetaminophen (TYLENOL)  tablet 650 mg  650 mg Oral Q4H PRN Sharene Butters E, PA-C      . albuterol (PROVENTIL) (2.5 MG/3ML) 0.083% nebulizer solution 2.5 mg  2.5 mg Nebulization Q2H PRN Rondel Jumbo, PA-C      . ALPRAZolam Duanne Moron) tablet 0.25 mg  0.25 mg Oral BID PRN Rondel Jumbo, PA-C      . alum & mag hydroxide-simeth (MAALOX/MYLANTA) 200-200-20 MG/5ML suspension 30 mL  30 mL Oral Q6H PRN Hongalgi, Anand D, MD      . amLODipine (NORVASC) tablet 5 mg  5 mg Oral Daily Rondel Jumbo, PA-C   5 mg at 09/06/17 1950  . cephALEXin (KEFLEX) capsule 500 mg  500 mg Oral TID Rondel Jumbo, PA-C   500 mg at 09/06/17 9326  . Chlorhexidine Gluconate Cloth 2 % PADS 6 each  6 each Topical Q0600 Desiree Hane, MD   6 each at 09/06/17 [phone]  . diclofenac sodium (VOLTAREN) 1 % transdermal gel 2 g  2 g Topical TID PRN Rondel Jumbo, PA-C      . escitalopram (LEXAPRO) tablet 10 mg  10 mg Oral Daily Rondel Jumbo, PA-C   10 mg at 09/06/17 5809  . ezetimibe (ZETIA) tablet 10 mg  10 mg Oral Daily Rondel Jumbo, PA-C   10 mg at 09/06/17 9833  . folic acid (FOLVITE) tablet 1 mg  1 mg Oral Daily Rondel Jumbo, PA-C   1 mg at 09/06/17 8250  . gabapentin (NEURONTIN) capsule 100 mg  100 mg Oral BID Rondel Jumbo, PA-C   100 mg at 09/06/17 5397  . guaiFENesin (MUCINEX) 12 hr tablet 600 mg  600 mg Oral BID Rondel Jumbo, PA-C   600 mg at 09/06/17 0836  . heparin ADULT infusion 100 units/mL (25000 units/279mL sodium chloride 0.45%)  2,000 Units/hr Intravenous Continuous Oretha Milch D, MD 20 mL/hr at 09/06/17 0835 2,000 Units/hr at 09/06/17 0835  . hydrOXYzine (ATARAX/VISTARIL) tablet 25 mg  25 mg Oral TID Rondel Jumbo, PA-C   25 mg at 09/06/17 6734  . insulin aspart (novoLOG) injection 0-15 Units  0-15 Units Subcutaneous TID WC Modena Jansky, MD   8 Units at 09/05/17 1642  . insulin aspart (novoLOG) injection 0-5 Units  0-5 Units Subcutaneous QHS Modena Jansky, MD   2 Units at 09/05/17 2257  . insulin aspart (novoLOG) injection 4 Units  4 Units Subcutaneous TID WC Modena Jansky, MD   4 Units at 09/06/17 [phone]  . insulin glargine (LANTUS) injection 42 Units  42 Units Subcutaneous QHS Rondel Jumbo, [place]   42 Units at 09/05/17 2257  . ipratropium-albuterol (DUONEB)  0.5-2.5 (3) MG/3ML nebulizer solution 3 mL  3 mL Nebulization Q6H PRN Rondel Jumbo, PA-C      . methotrexate (RHEUMATREX) tablet 10 mg  10 mg Oral Q Wed Wertman, Sara E, [place]      . metoprolol tartrate (LOPRESSOR) tablet 12.5 mg  12.5 mg Oral BID Rondel Jumbo, PA-C   12.5 mg at 09/06/17 0836  . morphine 2 MG/ML injection 1 mg  1 mg Intravenous Q4H PRN Modena Jansky, MD   1 mg at 09/05/17 9024  . multivitamin with minerals tablet 1 tablet  1 tablet Oral Q1500 Rondel Jumbo, PA-C   1 tablet at 09/05/17 1600  . mupirocin ointment (BACTROBAN) 2 % 1 application  1 application Nasal BID Desiree Hane, MD   1 application at  09/06/17 0839  . nitroGLYCERIN (NITROSTAT) SL tablet 0.4 mg  0.4 mg Sublingual Q5 min PRN Ivor Costa, MD   0.4 mg at 09/05/17 0751  . ondansetron (ZOFRAN) injection 4 mg  4 mg Intravenous Q6H PRN Wertman, Sara E, PA-C      . pantoprazole (PROTONIX) EC tablet 40 mg  40 mg Oral BID Modena Jansky, MD   40 mg at 09/06/17 0839  . pneumococcal 23 valent vaccine (PNU-IMMUNE) injection 0.5 mL  0.5 mL Intramuscular Tomorrow-1000 Hongalgi, Anand D, MD      . predniSONE (DELTASONE) tablet 10 mg  10 mg Oral Q breakfast Rondel Jumbo, PA-C   10 mg at 09/06/17 0836  . rOPINIRole (REQUIP) tablet 0.5 mg  0.5 mg Oral QHS Modena Jansky, MD   0.5 mg at 09/05/17 2257  . senna-docusate (Senokot-S) tablet 2 tablet  2 tablet Oral QHS Modena Jansky, MD   2 tablet at 09/05/17 2256  . torsemide (DEMADEX) tablet 20 mg  20 mg Oral Daily Rondel Jumbo, PA-C   20 mg at 09/06/17 0847  . traZODone (DESYREL) tablet 50 mg  50 mg Oral QHS Rondel Jumbo, PA-C   50 mg at 09/05/17 2256  . Warfarin - Pharmacist Dosing Inpatient   Does not apply q1800 Kris Mouton, [facility]         Discharge Medications: Please see discharge summary for a list of discharge medications.  Relevant Imaging Results:  Relevant Lab Results:   Additional Information SS#: [id number]  Candie Chroman,  LCSW

## 2017-09-06 NOTE — Progress Notes (Signed)
Patient has refused labs this morning. RN went into room while lab staff was present and explained the need for the blood draw- patient still stated that he was not willing to be stuck anymore. Will continue to monitor

## 2017-09-06 NOTE — Progress Notes (Signed)
ANTICOAGULATION CONSULT NOTE - F/u Consult  Pharmacy Consult for heparin Indication: DVT and possible pulmonary embolus  Allergies  Allergen Reactions  . Ace Inhibitors Swelling    Pt tolerates lisinopril  . Lipitor [Atorvastatin Calcium] Swelling  . Metformin And Related Swelling  . Morphine And Related Other (See Comments)    Sweating, feels like is "in rocky boat."  . Robaxin [Methocarbamol] Other (See Comments)    Feels like he is shaky   Patient Measurements: Height: 6\' 1"  (185.4 cm) Weight: (!) 357 lb 2.3 oz (162 kg) IBW/kg (Calculated) : 79.9 Heparin Dosing Weight: 119 kg  Vital Signs: Temp: 98.1 F (36.7 C) (12/28 0500) Temp Source: Oral (12/28 0500) BP: 133/70 (12/28 1156) Pulse Rate: 87 (12/28 1156)  Labs: Recent Labs    09/04/17 0940 09/05/17 0636 09/05/17 1147 09/05/17 1819 09/05/17 2325 09/06/17 0734 09/06/17 1208  HGB  --  12.8*  --   --   --  11.5*  --   HCT  --  38.9*  --   --   --  35.3*  --   PLT  --  198  --   --   --  231  --   LABPROT  --  13.0  --   --   --  14.9  --   INR  --  0.99  --   --   --  1.18  --   HEPARINUNFRC 0.41 0.39  --   --   --  1.06* 0.38  CREATININE 1.03 0.94  --   --   --   --   --   TROPONINI  --   --  <0.03 <0.03 <0.03  --   --     Estimated Creatinine Clearance: 118.2 mL/min (by C-G formula based on SCr of 0.94 mg/dL).  Assessment: 25 yoM admitted with DVT and possible PE (VQ- Indeterminate to high probability PE). Coumadin started 12/27 (day 2 of overlap) -INR= 1.18  -heparin level 1.08 but repeat was 0.38 (similar to prior values)  Goal of Therapy:  Heparin level 0.3-0.7 units/ml Monitor platelets by anticoagulation protocol: Yes   Plan:  -Continue heparin gtt at 2000 units/hr -Daily heparin level and CBC -Coumadin 7.5mg  po today -Daily PT/INR  Hildred Laser, Pharm D 09/06/2017 1:48 PM

## 2017-09-06 NOTE — Clinical Social Work Note (Signed)
Clinical Social Work Assessment  Patient Details  Name: [name] MRN: [id number] Date of Birth: [birth year]  Date of referral:  09/06/17               Reason for consult:  Discharge Planning                Permission sought to share information with:  Chartered certified accountant granted to share information::  Yes, Verbal Permission Granted  Name::        Agency::  [facility]  Relationship::     Contact Information:     Housing/Transportation Living arrangements for the past 2 months:  [place] of Information:  Patient, Medical Team Patient Interpreter Needed:  None Criminal Activity/Legal Involvement Pertinent to Current Situation/Hospitalization:  No - Comment as needed Significant Relationships:  Friend, Siblings Lives with:  Facility Resident Do you feel safe going back to the place where you live?  Yes Need for family participation in patient care:  Yes (Comment)  Care giving concerns:  Patient is a long-term resident at [facility].   Social Worker assessment / plan:  [name] met with patient. No supports at bedside. [name] introduced role and explained that discharge planning would be discussed. Patient confirmed he is a long-term resident at [facility] and plans to return once stable for discharge. No further concerns. [name] encouraged patient to contact [name] as needed. [name] will continue to follow patient for support and facilitate discharge back to SNF once medically stable.  Employment status:  Retired Forensic scientist:  Medicare PT Recommendations:  [place] / Referral to community resources:  [place]  Patient/Family's Response to care:  Patient agreeable to return to SNF. Patient's friend and brother supportive and involved in patient's care. Patient appreciated social work intervention.  Patient/Family's Understanding of and Emotional Response to Diagnosis, Current Treatment,  and Prognosis:  Patient has a good understanding of the reason for admission and plan to return to SNF once medically stable. Patient appears happy with hospital care.  Emotional Assessment Appearance:  Appears stated age Attitude/Demeanor/Rapport:  Other(Pleasant) Affect (typically observed):  Accepting, Appropriate, Calm, Pleasant Orientation:  Oriented to Self, Oriented to Place, Oriented to  Time, Oriented to Situation Alcohol / Substance use:  Never Used Psych involvement (Current and /or in the community):  No (Comment)  Discharge Needs  Concerns to be addressed:  Care Coordination Readmission within the last 30 days:  No Current discharge risk:  None Barriers to Discharge:  Continued Medical Work up   Candie Chroman, LCSW 09/06/2017, 12:03 PM
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
## 2017-09-07 ENCOUNTER — Other Ambulatory Visit: Payer: Self-pay

## 2017-09-07 DIAGNOSIS — I2699 Other pulmonary embolism without acute cor pulmonale: Secondary | ICD-10-CM | POA: Diagnosis present

## 2017-09-07 LAB — GLUCOSE, CAPILLARY
GLUCOSE-CAPILLARY: 285 mg/dL — AB (ref 65–99)
Glucose-Capillary: 185 mg/dL — ABNORMAL HIGH (ref 65–99)
Glucose-Capillary: 163 mg/dL — ABNORMAL HIGH (ref 65–99)
Glucose-Capillary: 217 mg/dL — ABNORMAL HIGH (ref 65–99)

## 2017-09-07 LAB — PROTIME-INR
INR: 1.5
PROTHROMBIN TIME: 18 s — AB (ref 11.4–15.2)

## 2017-09-07 LAB — HEPARIN LEVEL (UNFRACTIONATED): HEPARIN UNFRACTIONATED: 0.33 [IU]/mL (ref 0.30–0.70)

## 2017-09-07 MED ORDER — WARFARIN SODIUM 7.5 MG PO TABS
7.5000 mg | ORAL_TABLET | Freq: Once | ORAL | Status: AC
  Administered 2017-09-07: 7.5 mg via ORAL
  Filled 2017-09-07: qty 1

## 2017-09-07 NOTE — Progress Notes (Signed)
Patient ID: [name], male   DOB: 07-22-1946, 71 y.o.   MRN: [id number]                                                                PROGRESS NOTE                                                                                                                                                                                                             Patient Demographics:    [name], is a 71 y.o. male, DOB - 1945/12/14, AST:[id number]  Admit date - 09/03/2017   Admitting Physician Desiree Hane, MD  Outpatient Primary MD for the patient is Gildardo Cranker, DO  LOS - 4  Outpatient Specialists:    Chief Complaint  Patient presents with  . 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 Also reports Central pressure like chest pain, "feels like somebody standing on my chest" relieved by NTG the first time but not subsequently. Admitted for further evaluation of chest pain and dyspnea.Right lower extremity popliteal DVT, age indeterminate, VQ scan: Intermediate to high probability. Recurrent chest pain 12/27, negative EKG and troponin. Cardiology following.        Subjective:    [name] today is doing well.  No bleeding,  On heparin/coumadin.    No headache, No chest pain, No abdominal pain - No Nausea, No new weakness tingling or  numbness, No Cough - SOB.    Assessment  & Plan :    Principal Problem:   Chest pain Active Problems:   Diabetic neuropathy (HCC)   Chronic indwelling Foley catheter   Chronic diastolic CHF (congestive heart failure) (HCC)   UTI (urinary tract infection)  Neurogenic bladder   Uncontrolled type II diabetes with peripheral autonomic neuropathy (HCC)   Morbid obesity (HCC)   CAD in native artery   GERD (gastroesophageal reflux disease)   Restless legs   Depression   H/O: CVA (cerebrovascular accident)   Hypertensive heart disease with CHF (congestive heart failure) ([place])   Dyslipidemia associated with type 2 diabetes mellitus (HCC)   Anemia   Hyperkalemia   Pressure injury of skin     1. Acute purulent bronchitis: Chest x-ray without acute findings on exam. Has had symptoms for 3-4 weeks treated with antibiotics but worsenedprior to admission. Currently on oral cephalexin started for recent UTI. Although chest x-ray suggests left base pneumonia, clinically not consistent with pneumonia. No productive cough, fevers and only mild leukocytosis. Follow clinically. 2. Acute kidney injury complicating stage III chronic kidney disease:Baseline creatinine 0.8. Presented with creatinine of 2.03. May be related to poor oral intake. Acute kidney injury resolved. 3. Right popliteal vein DVT/intermediate to high probability PE by VQ scan: Noted venous Doppler report. Discussed with Dr. Su Monks surgeryon 12/26 >Unable to comment whether this is acute or chronic (age indeterminate) and recommend 6 months of anticoagulation if no contraindications. Currently on IV heparin.Not good candidate for DOAC'sdue to morbid obesity. Discussed with cardiology and pharmacy, recommend Coumadin with IV heparin/Lovenox bridging. Continue IV heparin til therapeutic INR  Coumadin pharmacy to dose 4. Hyperkalemia: Presented with potassium of 6.5. Resolved. 5. Hypomagnesemia. Replaced 6. Chest pain:Some  typical and atypical features for CAD. Patient also has lower extremity DVT of undetermined acuity. Unable to do CTA chest on admission due to acute kidney injury. DD: PE versus GE versus cardiac (seems less likely). Repeat EKG and troponin this morning negative. Some response to GI cocktail. Added PPI.  Appreciate cardiology input.  7. ? Acute on chronic respiratory failure with hypoxia:Reports using when necessary oxygen at nursing facility. Currently not hypoxic on room air. 8. Essential hypertension:Controlled. Continue metoprolol and amlodipine. 9. Uncontrolled DM 2: A1c 10.3. CBG is uncontrolled in the 270s. Continue Lantus. Addedbedtime sliding scale and mealtime coverage. Diabetes coordinator input appreciated.Better. Mildly uncontrolled and fluctuating. 10. Chronic diastolic CHF:Not overtly volume overloaded. 2-D echo last showed EF 55-60 percent and grade 1 diastolic dysfunction. Continue torsemide. 11. Hyperlipidemia: Continue Zetia. 12. Recent UTI: Complete course of Keflex. 13. Possible underlying COPD: Needs formal outpatient testing. 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 14. Bullous pemphigus:On chronic prednisone and methotrexate. 15. Anxiety and depression: Continue home regimen. 16. Deconditioning: Return to SNF when medically improved. 17. Morbid obesity/Body mass index is 46.18 kg/m. 18. YQM:GNOIBBCWUG of CPAP.   DVT prophylaxis:Currently on IV heparin. Code Status:Full Family Communication:None at bedside Disposition:DC back to SNF when medically improved., ie INR therapeutic   Consultants: Cardiology   Procedures: None  Antimicrobials: Oral Keflex       Lab Results  Component Value Date   PLT 231 09/06/2017    Antibiotics  :    Anti-infectives (From admission, onward)   Start     Dose/Rate Route Frequency Ordered Stop   09/03/17 1000  cephALEXin (KEFLEX) capsule 500 mg     500 mg Oral 3 times daily 09/03/17 0742           Objective:   Vitals:   09/06/17 0500 09/06/17 1156 09/06/17 1957 09/07/17 0545  BP: (!) 122/47 133/70 (!) 144/88 (!) 110/48  Pulse: 85 87 87 88  Resp: 14  18 18   Temp: 98.1 F (36.7 C)  97.9 F (36.6 C)  98 F (36.7 C)  TempSrc: Oral  Oral Oral  SpO2: 95% 95% 92% 94%  Weight: (!) 162 kg (357 lb 2.3 oz)   (!) 161.5 kg (356 lb 0.7 oz)  Height:        Wt Readings from Last 3 Encounters:  09/07/17 (!) 161.5 kg (356 lb 0.7 oz)  08/27/17 (!) 157.9 kg (348 lb 3.2 oz)  08/14/17 (!) 157.9 kg (348 lb 3.2 oz)     Intake/Output Summary (Last 24 hours) at 09/07/2017 0908 Last data filed at 09/06/2017 1100 Gross per 24 hour  Intake -  Output 550 ml  Net -550 ml     Physical Exam  Awake Alert, Oriented X 3, No new F.N deficits, Normal affect [place].AT,PERRAL Supple Neck,No JVD, No cervical lymphadenopathy appriciated.  Symmetrical Chest wall movement, Good air movement bilaterally, CTAB RRR,No Gallops,Rubs or new Murmurs, No Parasternal Heave +ve B.Sounds, Abd Soft, No tenderness, No organomegaly appriciated, No rebound - guarding or rigidity. No Cyanosis, Clubbing or edema, No new Rash or bruise   Morbid obeisity    Data Review:    CBC Recent Labs  Lab 09/03/17 0339 09/05/17 0636 09/06/17 0734  WBC 11.8* 12.3* 10.4  HGB 13.1 12.8* 11.5*  HCT 39.1 38.9* 35.3*  PLT 266 198 231  MCV 89.7 91.3 91.7  MCH 30.0 30.0 29.9  MCHC 33.5 32.9 32.6  RDW 15.7* 16.0* 16.0*    Chemistries  Recent Labs  Lab 09/03/17 0452 09/03/17 1216 09/04/17 0940 09/05/17 0636  NA 131*  --  134* 138  K 6.5* 6.0* 4.2 3.8  CL 99*  --  99* 101  CO2 21*  --  22 27  GLUCOSE 307*  --  283* 137*  BUN 23*  --  19 13  CREATININE 2.03*  --  1.03 0.94  CALCIUM 8.5*  --  8.2* 8.8*  MG  --  1.6*  --  1.7   ------------------------------------------------------------------------------------------------------------------ No results for input(s): CHOL, HDL, LDLCALC, TRIG, CHOLHDL, LDLDIRECT  in the last 72 hours.  Lab Results  Component Value Date   HGBA1C 10.3 (H) 09/03/2017   ------------------------------------------------------------------------------------------------------------------ No results for input(s): TSH, T4TOTAL, T3FREE, THYROIDAB in the last 72 hours.  Invalid input(s): FREET3 ------------------------------------------------------------------------------------------------------------------ No results for input(s): VITAMINB12, FOLATE, FERRITIN, TIBC, IRON, RETICCTPCT in the last 72 hours.  Coagulation profile Recent Labs  Lab 09/05/17 0636 09/06/17 0734  INR 0.99 1.18    No results for input(s): DDIMER in the last 72 hours.  Cardiac Enzymes Recent Labs  Lab 09/05/17 1147 09/05/17 1819 09/05/17 2325  TROPONINI <0.03 <0.03 <0.03   ------------------------------------------------------------------------------------------------------------------    Component Value Date/Time   BNP 175.2 (H) 09/05/2017 0636    Inpatient Medications  Scheduled Meds: . amLODipine  5 mg Oral Daily  . cephALEXin  500 mg Oral TID  . Chlorhexidine Gluconate Cloth  6 each Topical Q0600  . escitalopram  10 mg Oral Daily  . ezetimibe  10 mg Oral Daily  . folic acid  1 mg Oral Daily  . gabapentin  100 mg Oral BID  . guaiFENesin  600 mg Oral BID  . hydrOXYzine  25 mg Oral TID  . insulin aspart  0-15 Units Subcutaneous TID WC  . insulin aspart  0-5 Units Subcutaneous QHS  . insulin aspart  4 Units Subcutaneous TID WC  . insulin glargine  42 Units Subcutaneous QHS  . methotrexate  10 mg Oral Q Wed  . metoprolol tartrate  12.5 mg Oral BID  .  multivitamin with minerals  1 tablet Oral Q1500  . mupirocin ointment  1 application Nasal BID  . pantoprazole  40 mg Oral BID  . pneumococcal 23 valent vaccine  0.5 mL Intramuscular Tomorrow-1000  . predniSONE  10 mg Oral Q breakfast  . rOPINIRole  0.5 mg Oral QHS  . senna-docusate  2 tablet Oral QHS  . torsemide  20 mg Oral  Daily  . traZODone  50 mg Oral QHS  . Warfarin - Pharmacist Dosing Inpatient   Does not apply q1800   Continuous Infusions: . heparin 2,000 Units/hr (09/06/17 2356)   PRN Meds:.acetaminophen, albuterol, ALPRAZolam, alum & mag hydroxide-simeth, diclofenac sodium, ipratropium-albuterol, morphine injection, nitroGLYCERIN, ondansetron (ZOFRAN) IV  Micro Results Recent Results (from the past 240 hour(s))  MRSA PCR Screening     Status: Abnormal   Collection Time: 09/03/17 11:50 AM  Result Value Ref Range Status   MRSA by PCR POSITIVE (A) NEGATIVE Final    Comment:        The GeneXpert MRSA Assay (FDA approved for NASAL specimens only), is one component of a comprehensive MRSA colonization surveillance program. It is not intended to diagnose MRSA infection nor to guide or monitor treatment for MRSA infections. RESULT CALLED TO, READ BACK BY AND VERIFIED WITH: Waymon Budge RN 14:05 09/03/17 ([email or web address])     Radiology Reports Dg Chest 2 View  Result Date: 09/05/2017 CLINICAL DATA:  Shortness of breath. History of congestive heart failure, diabetes, hypertension, CHF, former smoker. EXAM: CHEST  2 VIEW COMPARISON:  Chest x-ray of September 03, 2017 FINDINGS: The lungs remain mildly hypoinflated. There is density that projects at the lung bases on the lateral view new which is likely bilateral. There is no pleural effusion or pneumothorax. The heart is mildly enlarged. The pulmonary vascularity is not engorged. The bony thorax exhibits no acute abnormality. IMPRESSION: Mild hypoinflation. Increased basilar density greatest on the left posteriorly suspicious for pneumonia. Followup PA and lateral chest X-ray is recommended in 3-4 weeks following trial of antibiotic therapy to ensure resolution and exclude underlying malignancy. Mild cardiomegaly without pulmonary edema. Electronically Signed   By: David  Martinique M.D.   On: 09/05/2017 10:33   Dg Chest 2 View  Result Date: 09/03/2017 CLINICAL DATA:   71 year old male with shortness of breath. EXAM: CHEST  2 VIEW COMPARISON:  Chest radiograph dated 06/01/2016 FINDINGS: Shallow inspiration. No focal consolidation, pleural effusion, or pneumothorax. Stable top-normal cardiac size. No acute osseous pathology. IMPRESSION: No active cardiopulmonary disease. Electronically Signed   By: Anner Crete M.D.   On: 09/03/2017 03:45   Nm Pulmonary Perf And Vent  Result Date: 09/04/2017 CLINICAL DATA:  Chest pain. EXAM: NUCLEAR MEDICINE VENTILATION - PERFUSION LUNG SCAN TECHNIQUE: Ventilation images were obtained in multiple projections using inhaled aerosol Tc-19m DTPA. Perfusion images were obtained in multiple projections after intravenous injection of Tc-81m MAA. RADIOPHARMACEUTICALS:  30.0 mCi Technetium-46m DTPA aerosol inhalation and 4.0 mCi Technetium-37m MAA IV COMPARISON:  Chest x-ray 09/03/2017 . FINDINGS: Multiple bilateral prominent perfusion defects noted. Poor ventilatory study, associated ventilation defects may be present. This study is indeterminate high probability pulmonary embolus. IMPRESSION: Indeterminate to high probability pulmonary embolus. Electronically Signed   By: Marcello Moores  Register   On: 09/04/2017 17:37    Time Spent in minutes  30   Jani Gravel M.D on 09/07/2017 at 9:08 AM  Between 7am to 7pm - Pager - [phone]    After 7pm go to [email or web address] - password [email or web address]  [facility] -  Office  [phone]     

## 2017-09-07 NOTE — Progress Notes (Signed)
ANTICOAGULATION CONSULT NOTE - F/u Consult  Pharmacy Consult for heparin Indication: DVT and possible pulmonary embolus  Allergies  Allergen Reactions  . Ace Inhibitors Swelling    Pt tolerates lisinopril  . Lipitor [Atorvastatin Calcium] Swelling  . Metformin And Related Swelling  . Morphine And Related Other (See Comments)    Sweating, feels like is "in rocky boat."  . Robaxin [Methocarbamol] Other (See Comments)    Feels like he is shaky   Patient Measurements: Height: 6\' 1"  (185.4 cm) Weight: (!) 356 lb 0.7 oz (161.5 kg) IBW/kg (Calculated) : 79.9 Heparin Dosing Weight: 119 kg  Vital Signs: Temp: 98 F (36.7 C) (12/29 0545) Temp Source: Oral (12/29 0545) BP: 110/48 (12/29 0545) Pulse Rate: 88 (12/29 0545)  Labs: Recent Labs    09/05/17 0636 09/05/17 1147 09/05/17 1819 09/05/17 2325 09/06/17 0734 09/06/17 1208 09/07/17 1111  HGB 12.8*  --   --   --  11.5*  --   --   HCT 38.9*  --   --   --  35.3*  --   --   PLT 198  --   --   --  231  --   --   LABPROT 13.0  --   --   --  14.9  --  18.0*  INR 0.99  --   --   --  1.18  --  1.50  HEPARINUNFRC 0.39  --   --   --  1.06* 0.38 0.33  CREATININE 0.94  --   --   --   --   --   --   TROPONINI  --  <0.03 <0.03 <0.03  --   --   --     Estimated Creatinine Clearance: 118 mL/min (by C-G formula based on SCr of 0.94 mg/dL).  Assessment: 84 yoM admitted with DVT and possible PE (VQ- Indeterminate to high probability PE). Coumadin started 12/27 (day 2 of overlap) -INR= 1.5 (trendig up -Heparin level therapeutic  Goal of Therapy:  Heparin level 0.3-0.7 units/ml Monitor platelets by anticoagulation protocol: Yes   Plan:  -Continue heparin gtt at 2000 units/hr -Daily heparin level and CBC -Coumadin 7.5mg  po today -Daily PT/INR  Thank you Anette Guarneri, PharmD [phone]  09/07/2017 1:01 PM

## 2017-09-07 NOTE — Progress Notes (Signed)
Pt had 5 beats run of V-Tach at 4:18 am. Pt denies any symptoms. No distress noted. MD was notified via page.

## 2017-09-08 DIAGNOSIS — L0292 Furuncle, unspecified: Secondary | ICD-10-CM | POA: Insufficient documentation

## 2017-09-08 DIAGNOSIS — M6530 Trigger finger, unspecified finger: Secondary | ICD-10-CM | POA: Insufficient documentation

## 2017-09-08 LAB — COMPREHENSIVE METABOLIC PANEL
ALBUMIN: 2.6 g/dL — AB (ref 3.5–5.0)
ALK PHOS: 70 U/L (ref 38–126)
ALT: 15 U/L — AB (ref 17–63)
AST: 13 U/L — AB (ref 15–41)
Anion gap: 10 (ref 5–15)
BUN: 15 mg/dL (ref 6–20)
CALCIUM: 8.3 mg/dL — AB (ref 8.9–10.3)
CO2: 28 mmol/L (ref 22–32)
CREATININE: 1.26 mg/dL — AB (ref 0.61–1.24)
Chloride: 96 mmol/L — ABNORMAL LOW (ref 101–111)
GFR calc Af Amer: >60 mL/min (ref >60)
GFR calc non Af Amer: 57 mL/min — ABNORMAL LOW (ref >60)
GLUCOSE: 190 mg/dL — AB (ref 65–99)
Potassium: 3.4 mmol/L — ABNORMAL LOW (ref 3.5–5.1)
SODIUM: 134 mmol/L — AB (ref 135–145)
Total Bilirubin: 0.3 mg/dL (ref 0.3–1.2)
Total Protein: 5.9 g/dL — ABNORMAL LOW (ref 6.5–8.1)

## 2017-09-08 LAB — GLUCOSE, CAPILLARY
Glucose-Capillary: 185 mg/dL — ABNORMAL HIGH (ref 65–99)
Glucose-Capillary: 203 mg/dL — ABNORMAL HIGH (ref 65–99)

## 2017-09-08 LAB — CBC
HEMATOCRIT: 34.6 % — AB (ref 39.0–52.0)
Hemoglobin: 11.3 g/dL — ABNORMAL LOW (ref 13.0–17.0)
MCH: 29.9 pg (ref 26.0–34.0)
MCHC: 32.7 g/dL (ref 30.0–36.0)
MCV: 91.5 fL (ref 78.0–100.0)
Platelets: 226 10*3/uL (ref 150–400)
RBC: 3.78 MIL/uL — AB (ref 4.22–5.81)
RDW: 16 % — AB (ref 11.5–15.5)
WBC: 10 10*3/uL (ref 4.0–10.5)

## 2017-09-08 LAB — PROTIME-INR
INR: 2.17
Prothrombin Time: 24 seconds — ABNORMAL HIGH (ref 11.4–15.2)

## 2017-09-08 LAB — HEPARIN LEVEL (UNFRACTIONATED): Heparin Unfractionated: 0.43 IU/mL (ref 0.30–0.70)

## 2017-09-08 MED ORDER — PRO-STAT SUGAR FREE PO LIQD: 30.0000 mL | Freq: Two times a day (BID) | ORAL | 0 refills | Status: DC

## 2017-09-08 MED ORDER — WARFARIN SODIUM 5 MG PO TABS: 5.0000 mg | ORAL_TABLET | Freq: Every day | ORAL | 0 refills | Status: DC

## 2017-09-08 MED ORDER — PRO-STAT SUGAR FREE PO LIQD
30.0000 mL | Freq: Two times a day (BID) | ORAL | Status: DC
  Administered 2017-09-08: 30 mL via ORAL
  Filled 2017-09-08: qty 30

## 2017-09-08 MED ORDER — POTASSIUM CHLORIDE CRYS ER 20 MEQ PO TBCR
20.0000 meq | EXTENDED_RELEASE_TABLET | Freq: Once | ORAL | Status: AC
  Administered 2017-09-08: 20 meq via ORAL
  Filled 2017-09-08: qty 1

## 2017-09-08 MED ORDER — WARFARIN SODIUM 5 MG PO TABS: 5.0000 mg | ORAL_TABLET | Freq: Once | ORAL | Status: DC

## 2017-09-08 MED ORDER — PNEUMOCOCCAL VAC POLYVALENT 25 MCG/0.5ML IJ INJ: 0.5000 mL | INJECTION | INTRAMUSCULAR | 0 refills | Status: AC

## 2017-09-08 NOTE — Progress Notes (Signed)
ANTICOAGULATION CONSULT NOTE - Follow Up Consult  Pharmacy Consult for heparin and warfarin Indication: DVT and possible pulmonary embolus  Allergies  Allergen Reactions  . Ace Inhibitors Swelling    Pt tolerates lisinopril  . Lipitor [Atorvastatin Calcium] Swelling  . Metformin And Related Swelling  . Morphine And Related Other (See Comments)    Sweating, feels like is "in rocky boat."  . Robaxin [Methocarbamol] Other (See Comments)    Feels like he is shaky   Patient Measurements: Height: 6\' 1"  (185.4 cm) Weight: (!) 354 lb 8 oz (160.8 kg) IBW/kg (Calculated) : 79.9 Heparin Dosing Weight: 119 kg  Vital Signs: Temp: 97.4 F (36.3 C) (12/30 0459) Temp Source: Oral (12/30 0459) BP: 114/60 (12/30 0459) Pulse Rate: 76 (12/30 0459)  Labs: Recent Labs    09/05/17 1147 09/05/17 1819 09/05/17 2325  09/06/17 0734 09/06/17 1208 09/07/17 1111 09/08/17 0443  HGB  --   --   --   --  11.5*  --   --  11.3*  HCT  --   --   --   --  35.3*  --   --  34.6*  PLT  --   --   --   --  231  --   --  226  LABPROT  --   --   --   --  14.9  --  18.0* 24.0*  INR  --   --   --   --  1.18  --  1.50 2.17  HEPARINUNFRC  --   --   --    < > 1.06* 0.38 0.33 0.43  CREATININE  --   --   --   --   --   --   --  1.26*  TROPONINI <0.03 <0.03 <0.03  --   --   --   --   --    < > = values in this interval not displayed.    Estimated Creatinine Clearance: 87.9 mL/min (A) (by C-G formula based on SCr of 1.26 mg/dL (H)).   Medications: Heparin @ 2000 units/hr  Assessment: 68 yoM admitted with DVT and possible PE (VQ- Indeterminate to high probability PE). He was started on IV heparin and coumadin added 12/27. Today is day #4/5 overlap. Heparin level is therapeutic at 0.43. INR therapeutic at 2.17. CBC stable. No bleeding reported.  Goal of Therapy:  INR 2-3 Heparin level 0.3-0.7 units/ml Monitor platelets by anticoagulation protocol: Yes   Plan:  1) Continue heparin at 2000 units/hr 2) Coumadin  5mg  tonight 3) Daily heparin level and CBC 4) Needs at least 1 more day of heparin/coumadin overlap   Nena Jordan, PharmD, BCPS  09/08/2017 11:07 AM

## 2017-09-08 NOTE — Progress Notes (Signed)
Patient ID: [name], male   DOB: 07-15-1946, 71 y.o.   MRN: [id number]                                                                PROGRESS NOTE                                                                                                                                                                                                             Patient Demographics:    [name], is a 71 y.o. male, DOB - 1945/10/01, UVO:[id number]  Admit date - 09/03/2017   Admitting Physician Desiree Hane, MD  Outpatient Primary MD for the patient is Gildardo Cranker, DO  LOS - 5  Outpatient Specialists:     Chief Complaint  Patient presents with  . 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 Also reports Central pressure like chest pain, "feels like somebody standing on my chest" relieved by NTG the first time but not subsequently. Admitted for further evaluation of chest pain and dyspnea.Right lower extremity popliteal DVT, age indeterminate, VQ scan: Intermediate to high probability. Recurrent chest pain 12/27, negative EKG and troponin. Cardiology following.       Subjective:    [name] today states that breathing is good.  He is not currently having chest pain,  INR in the therapeutic range today.   No headache, No abdominal pain - No  Nausea, No new weakness tingling or numbness, No Cough   Assessment  & Plan :    Principal Problem:   Chest pain Active Problems:   Diabetic neuropathy (HCC)   Chronic indwelling Foley catheter   Chronic diastolic CHF (congestive heart failure) ([place])  UTI (urinary tract infection)   Neurogenic bladder   Uncontrolled type II diabetes with peripheral autonomic neuropathy (HCC)   Morbid obesity (HCC)   CAD in native artery   GERD (gastroesophageal reflux disease)   Restless legs   Depression   H/O: CVA (cerebrovascular accident)   Hypertensive heart disease with CHF (congestive heart failure) ([place])   Dyslipidemia associated with type 2 diabetes mellitus ([place])   Anemia   Hyperkalemia   Pressure injury of skin   Acute pulmonary embolism ([place])     1. Acute purulent bronchitis: Chest x-ray without acute findings on exam. Has had symptoms for 3-4 weeks treated with antibiotics but worsenedprior to admission. Currently on oral cephalexin started for recent UTI. Although chest x-ray suggests left base pneumonia, clinically not consistent with pneumonia. No productive cough, fevers and only mild leukocytosis. Follow clinically. 2. Acute kidney injury complicating stage III chronic kidney disease:Baseline creatinine 0.8. Presented with creatinine of 2.03. May be related to poor oral intake. Acute kidney injury resolved. 3. Right popliteal vein DVT/intermediate to high probability PE by VQ scan: Noted venous Doppler report. Discussed with Dr. Su Monks surgeryon 12/26 >Unable to comment whether this is acute or chronic (age indeterminate) and recommend 6 months of anticoagulation if no contraindications. Currently on IV heparin.Not good candidate for DOAC'sdue to morbid obesity. Discussed with cardiology and pharmacy, recommend Coumadin with IV heparin/Lovenox bridging. Continue IV heparintil therapeutic INR Coumadin pharmacy to dose 4. Hyperkalemia: Presented with potassium of  6.5. Resolved. 5. Hypomagnesemia. Replaced 6. Chest pain:Some typical and atypical features for CAD. Patient also has lower extremity DVT of undetermined acuity. Unable to do CTA chest on admission due to acute kidney injury. DD: PE versus GE versus cardiac (seems less likely). Repeat EKG and troponin this morning negative. Some response to GI cocktail. Added PPI.Appreciate cardiology input. 7. ? Acute on chronic respiratory failure with hypoxia:Reports using when necessary oxygen at nursing facility. Currently not hypoxic on room air. 8. Essential hypertension:Controlled. Continue metoprolol and amlodipine. 9. Uncontrolled DM 2: A1c 10.3. CBG is uncontrolled in the 270s. Continue Lantus. Addedbedtime sliding scale and mealtime coverage. Diabetes coordinator input appreciated.Better. Mildly uncontrolled and fluctuating. 10. Chronic diastolic CHF:Not overtly volume overloaded. 2-D echo last showed EF 55-60 percent and grade 1 diastolic dysfunction. Continue torsemide. 11. Hyperlipidemia: Continue Zetia. 12. Recent UTI: Complete course of Keflex. 13. Possible underlying COPD: Needs formal outpatient testing. 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 14. Bullous pemphigus:On chronic prednisone and methotrexate. 15. Anxiety and depression: Continue home regimen. 16. Deconditioning: Return to SNF when medically improved. 17. Morbid obesity/Body mass index is 46.18 kg/m. 18. PJK:DTOIZTIWPY of CPAP.   DVT prophylaxis:Currently on IV heparin. Code Status:Full Family Communication:None at bedside Disposition:DC back to SNF when medically improved., ie INR therapeutic   Consultants: Cardiology   Procedures: None  Antimicrobials: Oral Keflex          Lab Results  Component Value Date   PLT 226 09/08/2017     Anti-infectives (From admission, onward)   Start     Dose/Rate Route Frequency Ordered Stop   09/03/17 1000  cephALEXin (KEFLEX) capsule  500 mg     500 mg Oral 3 times daily 09/03/17 0742          Objective:   Vitals:   09/07/17 1300 09/07/17 2004 09/07/17 2154 09/08/17 0459  BP: (!) 119/47 (!) 99/43 116/62 114/60  Pulse: 70 (!) 102 84 76  Resp: 18 18  18   Temp: (!) 97.5 F (36.4  C) 97.9 F (36.6 C)  (!) 97.4 F (36.3 C)  TempSrc: Oral Oral  Oral  SpO2: 98% 94%  97%  Weight:    (!) 160.8 kg (354 lb 8 oz)  Height:        Wt Readings from Last 3 Encounters:  09/08/17 (!) 160.8 kg (354 lb 8 oz)  08/27/17 (!) 157.9 kg (348 lb 3.2 oz)  08/14/17 (!) 157.9 kg (348 lb 3.2 oz)     Intake/Output Summary (Last 24 hours) at 09/08/2017 1008 Last data filed at 09/08/2017 0704 Gross per 24 hour  Intake 2778.66 ml  Output 1301 ml  Net 1477.66 ml     Physical Exam  Awake Alert, Oriented X 3, No new F.N deficits, Normal affect .AT,PERRAL Supple Neck,No JVD, No cervical lymphadenopathy appriciated.  Symmetrical Chest wall movement, Good air movement bilaterally, CTAB RRR,No Gallops,Rubs or new Murmurs, No Parasternal Heave +ve B.Sounds, Abd Soft, No tenderness, No organomegaly appriciated, No rebound - guarding or rigidity. No Cyanosis, Clubbing or edema, No new Rash or bruise   Morbid obeisity   Data Review:    CBC Recent Labs  Lab 09/03/17 0339 09/05/17 0636 09/06/17 0734 09/08/17 0443  WBC 11.8* 12.3* 10.4 10.0  HGB 13.1 12.8* 11.5* 11.3*  HCT 39.1 38.9* 35.3* 34.6*  PLT 266 198 231 226  MCV 89.7 91.3 91.7 91.5  MCH 30.0 30.0 29.9 29.9  MCHC 33.5 32.9 32.6 32.7  RDW 15.7* 16.0* 16.0* 16.0*    Chemistries  Recent Labs  Lab 09/03/17 0452 09/03/17 1216 09/04/17 0940 09/05/17 0636 09/08/17 0443  NA 131*  --  134* 138 134*  K 6.5* 6.0* 4.2 3.8 3.4*  CL 99*  --  99* 101 96*  CO2 21*  --  22 27 28   GLUCOSE 307*  --  283* 137* 190*  BUN 23*  --  19 13 15   CREATININE 2.03*  --  1.03 0.94 1.26*  CALCIUM 8.5*  --  8.2* 8.8* 8.3*  MG  --  1.6*  --  1.7  --   AST  --   --   --   --  13*  ALT   --   --   --   --  15*  ALKPHOS  --   --   --   --  70  BILITOT  --   --   --   --  0.3   ------------------------------------------------------------------------------------------------------------------ No results for input(s): CHOL, HDL, LDLCALC, TRIG, CHOLHDL, LDLDIRECT in the last 72 hours.  Lab Results  Component Value Date   HGBA1C 10.3 (H) 09/03/2017   ------------------------------------------------------------------------------------------------------------------ No results for input(s): TSH, T4TOTAL, T3FREE, THYROIDAB in the last 72 hours.  Invalid input(s): FREET3 ------------------------------------------------------------------------------------------------------------------ No results for input(s): VITAMINB12, FOLATE, FERRITIN, TIBC, IRON, RETICCTPCT in the last 72 hours.  Coagulation profile Recent Labs  Lab 09/05/17 0636 09/06/17 0734 09/07/17 1111 09/08/17 0443  INR 0.99 1.18 1.50 2.17    No results for input(s): DDIMER in the last 72 hours.  Cardiac Enzymes Recent Labs  Lab 09/05/17 1147 09/05/17 1819 09/05/17 2325  TROPONINI <0.03 <0.03 <0.03   ------------------------------------------------------------------------------------------------------------------    Component Value Date/Time   BNP 175.2 (H) 09/05/2017 0636    Inpatient Medications  Scheduled Meds: . amLODipine  5 mg Oral Daily  . cephALEXin  500 mg Oral TID  . Chlorhexidine Gluconate Cloth  6 each Topical Q0600  . escitalopram  10 mg Oral Daily  . ezetimibe  10 mg Oral Daily  .  feeding supplement (PRO-STAT SUGAR FREE 64)  30 mL Oral BID  . folic acid  1 mg Oral Daily  . gabapentin  100 mg Oral BID  . guaiFENesin  600 mg Oral BID  . hydrOXYzine  25 mg Oral TID  . insulin aspart  0-15 Units Subcutaneous TID WC  . insulin aspart  0-5 Units Subcutaneous QHS  . insulin aspart  4 Units Subcutaneous TID WC  . insulin glargine  42 Units Subcutaneous QHS  . methotrexate  10 mg Oral Q  Wed  . metoprolol tartrate  12.5 mg Oral BID  . multivitamin with minerals  1 tablet Oral Q1500  . pantoprazole  40 mg Oral BID  . pneumococcal 23 valent vaccine  0.5 mL Intramuscular Tomorrow-1000  . potassium chloride  20 mEq Oral Once  . predniSONE  10 mg Oral Q breakfast  . rOPINIRole  0.5 mg Oral QHS  . senna-docusate  2 tablet Oral QHS  . torsemide  20 mg Oral Daily  . traZODone  50 mg Oral QHS  . Warfarin - Pharmacist Dosing Inpatient   Does not apply q1800   Continuous Infusions: . heparin 2,000 Units/hr (09/08/17 0446)   PRN Meds:.acetaminophen, albuterol, ALPRAZolam, alum & mag hydroxide-simeth, diclofenac sodium, ipratropium-albuterol, morphine injection, nitroGLYCERIN, ondansetron (ZOFRAN) IV  Micro Results Recent Results (from the past 240 hour(s))  MRSA PCR Screening     Status: Abnormal   Collection Time: 09/03/17 11:50 AM  Result Value Ref Range Status   MRSA by PCR POSITIVE (A) NEGATIVE Final    Comment:        The GeneXpert MRSA Assay (FDA approved for NASAL specimens only), is one component of a comprehensive MRSA colonization surveillance program. It is not intended to diagnose MRSA infection nor to guide or monitor treatment for MRSA infections. RESULT CALLED TO, READ BACK BY AND VERIFIED WITH: Waymon Budge RN 14:05 09/03/17 ([email or web address])     Radiology Reports Dg Chest 2 View  Result Date: 09/05/2017 CLINICAL DATA:  Shortness of breath. History of congestive heart failure, diabetes, hypertension, CHF, former smoker. EXAM: CHEST  2 VIEW COMPARISON:  Chest x-ray of September 03, 2017 FINDINGS: The lungs remain mildly hypoinflated. There is density that projects at the lung bases on the lateral view new which is likely bilateral. There is no pleural effusion or pneumothorax. The heart is mildly enlarged. The pulmonary vascularity is not engorged. The bony thorax exhibits no acute abnormality. IMPRESSION: Mild hypoinflation. Increased basilar density greatest on  the left posteriorly suspicious for pneumonia. Followup PA and lateral chest X-ray is recommended in 3-4 weeks following trial of antibiotic therapy to ensure resolution and exclude underlying malignancy. Mild cardiomegaly without pulmonary edema. Electronically Signed   By: David  Martinique M.D.   On: 09/05/2017 10:33   Dg Chest 2 View  Result Date: 09/03/2017 CLINICAL DATA:  71 year old male with shortness of breath. EXAM: CHEST  2 VIEW COMPARISON:  Chest radiograph dated 06/01/2016 FINDINGS: Shallow inspiration. No focal consolidation, pleural effusion, or pneumothorax. Stable top-normal cardiac size. No acute osseous pathology. IMPRESSION: No active cardiopulmonary disease. Electronically Signed   By: Anner Crete M.D.   On: 09/03/2017 03:45   Nm Pulmonary Perf And Vent  Result Date: 09/04/2017 CLINICAL DATA:  Chest pain. EXAM: NUCLEAR MEDICINE VENTILATION - PERFUSION LUNG SCAN TECHNIQUE: Ventilation images were obtained in multiple projections using inhaled aerosol Tc-35m DTPA. Perfusion images were obtained in multiple projections after intravenous injection of Tc-8m MAA. RADIOPHARMACEUTICALS:  30.0 mCi Technetium-29m DTPA aerosol  inhalation and 4.0 mCi Technetium-25m MAA IV COMPARISON:  Chest x-ray 09/03/2017 . FINDINGS: Multiple bilateral prominent perfusion defects noted. Poor ventilatory study, associated ventilation defects may be present. This study is indeterminate high probability pulmonary embolus. IMPRESSION: Indeterminate to high probability pulmonary embolus. Electronically Signed   By: Marcello Moores  Register   On: 09/04/2017 17:37    Time Spent in minutes  30   Jani Gravel M.D on 09/08/2017 at 10:08 AM  Between 7am to 7pm - Pager - [phone]   After 7pm go to [email or web address] - password [facility]  [facility] -  Office  [phone]

## 2017-09-08 NOTE — Progress Notes (Signed)
Pt is discharged to [facility] rehab back. Report is provided to the receiving nurse, IV taken off, tele out, report and paperwork is provided to the [facility], pt left with with all of his belongings in a stable condition

## 2017-09-08 NOTE — Discharge Summary (Signed)
[name], is a 71 y.o. male  DOB 09-16-1945  MRN [id number].  Admission date:  09/03/2017  Admitting Physician  Desiree Hane, MD  Discharge Date:  09/08/2017   Primary MD  Gildardo Cranker, DO  Recommendations for primary care physician for things to follow:    Pulmonary Embolism and R popliteal vein DVT Coumadin 5mg  po qday  Check INR in 1-2 days Check cbc in 1-2 days  Hyperkalemia resolved Check cmp in 1-2 days  Hypomagnesemia Check magnesium in 1-2 days  UTI Keflex 500mg  po tid  x 1 day  ARF on CKD stage 3 Pt presented with creatinine 2.03 , normalized with hydration Check cmp in 1-2 days  Chest pain thought to be related to GI vs PE Cardiology evaluated, no further w/up indicated  Chronic diastolic CHF Cont torsemide  Essential hypertension:Controlled.  Continue metoprolol and amlodipine.  Severe protein calorie malnutrition prostat 40ml po bid     Admission Diagnosis  Shortness of breath [R06.02] Hyperkalemia [E87.5] AKI (acute kidney injury) ([place]) [N17.9] Chest pain [R07.9] Nonspecific chest pain [R07.9]   Discharge Diagnosis  Shortness of breath [R06.02] Hyperkalemia [E87.5] AKI (acute kidney injury) ([place]) [N17.9] Chest pain [R07.9] Nonspecific chest pain [R07.9]     Principal Problem:   Chest pain Active Problems:   Diabetic neuropathy (HCC)   Chronic indwelling Foley catheter   Chronic diastolic CHF (congestive heart failure) (HCC)   UTI (urinary tract infection)   Neurogenic bladder   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)      Past Medical History:  Diagnosis Date  . Arthritis    "hands, ankles, knees" (11/10/2014)  . Bladder spasms   . CAD (coronary artery disease)   . CAD in native artery 09/15/2015  . CHF (congestive heart failure) ([place])   . Chronic indwelling Foley catheter   . Chronic lower back pain   . Depression   . Diabetic diarrhea ([place])   . Diabetic neuropathy, painful ([place])   . DVT (deep venous thrombosis) (HCC) 1980's   LLE  . GERD (gastroesophageal reflux disease)   . Hyperlipidemia   . Hypertension   . Neurogenic bladder   . Obese   . OSA (obstructive sleep apnea)    "they wanted me to wear a mask; I couldn't" (11/10/2014)  . Pneumonia 1999  . PONV (postoperative nausea and vomiting)   . Stroke [facility]) 10/2011; 06/2014   right hand numbness/notes 10/20/2011, pt not sure he had stroke in 2013; "right hand weaker and right face not quite right since" (11/10/2014)  . Type II diabetes mellitus (  [place])   . Uncontrolled type II diabetes with peripheral autonomic neuropathy ([place]) 05/09/2015    Past Surgical History:  Procedure Laterality Date  . CARDIAC CATHETERIZATION N/A 05/02/2015   Procedure: Left Heart Cath and Coronary Angiography;  Surgeon: Lorretta Harp, MD;  Location: [facility];  Service: Cardiovascular;  Laterality: N/A;  . CARDIAC CATHETERIZATION N/A 05/04/2015   Procedure: Left Heart Cath and Coronary Angiography;  Surgeon: Wellington Hampshire, MD;  Location: [facility];  Service: Cardiovascular;  Laterality: N/A;  . CARPAL TUNNEL RELEASE Bilateral ~ 2003-2004  . CHOLECYSTECTOMY OPEN  1970's?  [facility] Kitchen GASTROPLASTY    . JOINT REPLACEMENT    . LEFT HEART CATHETERIZATION WITH CORONARY ANGIOGRAM N/A 10/24/2011   Procedure: LEFT HEART CATHETERIZATION WITH CORONARY ANGIOGRAM;  Surgeon: Candee Furbish, MD;  Location: [facility] C[facility] LAB;  Service: Cardiovascular;  Laterality: N/A;  right radial artery  approach  . TOTAL KNEE ARTHROPLASTY Right 1990's       HPI  from the history and physical done on the day of admission:     71 y.o. 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  The patient does wear oxygen intermittently, and really felt that he needed it today.  Reports grey productive cough. Denies fevers, chills, night sweats or mucositis.  He denies any chest wall pain or palpitations.Denies any sick contacts or recent long distant travels.  He is very sedentary in fact.  Denies any abdominal pain. Has decreased appetite due to current symptoms. No food indiscretions. Denies dizziness or vertigo.  Reports chronic lower extremity swelling but denies calf pain. No confusion was reported. Denies any vision changes, double vision or headaches.  Denies any unilateral numbness or weakness.   ED Course:  BP 118/83 (BP Location: Right Arm)   Pulse (!) 108   Temp 97.7 F (36.5 C) (Oral)   Resp 15   SpO2 100%   Troponin 0 0.03 Potassium was elevated at 6.5, receiving Kayexalate 30, small amount of IV fluids, D50 and insulin with new lab values are pending.  Of note, prior potassium on 08/09/2017 was 4.8. Creatinine elevated at 2.03 Sinus or ectopic atrial tachycardia Prolonged PR interval Incomplete left bundle branch block Probable left ventricular hypertrophy No significant change since last tracing  Glucose 307 with anion gap 11 BUN P1 100.7 NA 131  White count 11.8 Hemoglobin 13.1,  platelets 266 Chest x-ray negative for active cardiopulmonary disease      Hospital Course:     Pt was admitted for w/up of chest pain and hyperkalemia.  Pt was treated with kayexalate and iv hydration with normalization of potassium.    Cardiology was consulted for borderline troponin of 0.03.   Pt was thought by cardiology to have CP related to PE.    VQ 12/26=> IMPRESSION: Indeterminate to high probability pulmonary embolus.  Bilateral LE ultrasound 12/25=> Final Interpretation Right: Ultrasound is unable to distinguish whether obstruction in the Popliteal vein is acute or chronic. There is no evidence of superficial venous thrombosis. No cystic structure found in the  popliteal fossa. Left: There is no evidence of deep vein thrombosis in the lower extremity.There is no evidence of superficial venous thrombosis. No cystic structure found in the popliteal fossa.   Cardiac echo 12/26=>  - Procedure narrative: Transthoracic echocardiography. Image   quality was suboptimal. The study was technically difficult, as a   result of poor acoustic windows, poor sound wave transmission,   poor patient compliance, restricted patient mobility, and body   habitus. Intravenous contrast (Definity) was administered. - Left ventricle: The cavity size was normal. Wall thickness was   increased in a pattern of mild LVH. Systolic function was normal.   The estimated ejection fraction was in the range of 50% to 55%.   Wall motion was normal; there were no regional wall motion   abnormalities. Doppler parameters are consistent with restrictive   physiology, indicative of decreased left ventricular diastolic   compliance and/or increased left atrial pressure. - Aortic valve: Valve mobility was restricted. There was mild   stenosis. Valve area (VTI): 1.53 cm^2. Valve area (Vmax): 1.4   cm^2. Valve area (Vmean): 1.38 cm^2. - Aortic root: The aortic root was mildly dilated. - Mitral valve:  Calcified annulus.  Impressions:  - Technically difficult; definity used; normal LV systolic   function; mild LVH; restrictive filling; calcified aortic valve   with mild ASl; mildly dilated aortic root.  Pt was started on heparin on admission for ?DVT, and possible PE. Coumadin started, pharmacy to dose once VQ scan resulted.  Pt has been on coumadin 5mg  po qday.  INR today 2.17.  Pt states that his chest pain has resolved.     Follow UP   Contact information for follow-up providers    Gildardo Cranker, DO Follow up in 3 day(s).   Specialty:  Internal Medicine Contact information: [place] [id number] [phone]            Contact information for after-discharge care    Destination    [facility] Follow up.   Service:  Skilled Nursing Contact information: [address] [place] [phone]                   Consults obtained - cardiology  Discharge Condition: stable  Diet and Activity recommendation: See Discharge Instructions below  Discharge Instructions         Discharge Medications     Allergies as of 09/08/2017      Reactions   Ace Inhibitors Swelling   Pt tolerates lisinopril   Lipitor [atorvastatin Calcium] Swelling   Metformin And Related Swelling   Morphine And Related Other (See Comments)   Sweating, feels like is "in [place]."   Robaxin [methocarbamol] Other (See Comments)   Feels like he is shaky      Medication List    STOP taking these medications   aspirin EC 81 MG tablet     TAKE these medications   acetaminophen 500 MG tablet Commonly known as:  TYLENOL Take 1,000 mg by mouth 3 (three) times daily.   aluminum-magnesium hydroxide-simethicone 456-256-38 MG/5ML Susp Commonly known as:  MAALOX Take 30 mLs by mouth every 6 (six) hours as needed (indigestion).   cephALEXin 500 MG capsule Commonly known as:  KEFLEX Take 500 mg by mouth 3  (three) times daily.   DECUBI-VITE Caps Take 1 capsule by mouth daily at 3 pm.   diclofenac sodium 1 % Gel Commonly known as:  VOLTAREN Apply  topically 3 (three) times daily. Apply to left shoulder topically three times daily   escitalopram 10 MG tablet Commonly known as:  LEXAPRO Take 10 mg by mouth daily.   ezetimibe 10 MG tablet Commonly known as:  ZETIA Take 10 mg by mouth daily.   feeding supplement (PRO-STAT SUGAR FREE 64) Liqd Take 30 mLs by mouth 2 (two) times daily.   folic acid 1 MG tablet Commonly known as:  FOLVITE Take 1 mg by mouth daily.   gabapentin 100 MG capsule Commonly known as:  NEURONTIN Take 1 capsule (100 mg total) by mouth 2 (two) times daily.   guaiFENesin 600 MG 12 hr tablet Commonly known as:  MUCINEX Take 600 mg by mouth 2 (two) times daily.   HUMALOG 100 UNIT/ML injection Generic drug:  insulin lispro Inject 12 Units into the skin 3 (three) times daily with meals.   hydrOXYzine 25 MG tablet Commonly known as:  ATARAX/VISTARIL Take 25 mg by mouth 3 (three) times daily.   ipratropium-albuterol 0.5-2.5 (3) MG/3ML Soln Commonly known as:  DUONEB Take 3 mLs by nebulization every 6 (six) hours as needed (sob).   methotrexate 10 MG tablet Commonly known as:  RHEUMATREX Take 10 mg by mouth every Wednesday. Caution: Chemotherapy. Protect from light.   metoprolol tartrate 25 MG tablet Commonly known as:  LOPRESSOR Take 12.5 mg by mouth 2 (two) times daily.   nitroGLYCERIN 0.4 MG SL tablet Commonly known as:  NITROSTAT Place 0.4 mg under the tongue every 5 (five) minutes as needed for chest pain.   NORVASC 5 MG tablet Generic drug:  amLODipine Take 5 mg by mouth daily.   omeprazole 20 MG capsule Commonly known as:  PRILOSEC Take 20 mg by mouth daily.   OXYGEN Inhale into the lungs. O2 at 2l/min via nasal canula as needed for shortness of breath   pneumococcal 23 valent vaccine 25 MCG/0.5ML injection Commonly known as:   PNU-IMMUNE Inject 0.5 mLs into the muscle tomorrow at 10 am for 1 dose.   potassium chloride 10 MEQ tablet Commonly known as:  K-DUR Take 10 mEq by mouth 4 (four) times daily.   predniSONE 10 MG tablet Commonly known as:  DELTASONE Take 10 mg by mouth daily.   rOPINIRole 0.5 MG tablet Commonly known as:  REQUIP Take 0.5 mg by mouth at bedtime. 2100   sennosides-docusate sodium 8.6-50 MG tablet Commonly known as:  SENOKOT-S Take 2 tablets by mouth at bedtime. 2100   torsemide 20 MG tablet Commonly known as:  DEMADEX Take 20 mg by mouth daily.   TOUJEO SOLOSTAR 300 UNIT/ML Sopn Generic drug:  Insulin Glargine Inject 42 Units into the skin at bedtime.   traMADol 50 MG tablet Commonly known as:  ULTRAM Take 2 tablets (100 mg total) by mouth 3 (three) times daily. Hold if Lethargic or confused   traZODone 50 MG tablet Commonly known as:  DESYREL Take 50 mg by mouth at bedtime.   warfarin 5 MG tablet Commonly known as:  COUMADIN Take 1 tablet (5 mg total) by mouth daily at 6 PM.       Major procedures and Radiology Reports - PLEASE review detailed and final reports for all details, in brief -      Dg Chest 2 View  Result Date: 09/05/2017 CLINICAL DATA:  Shortness of breath. History of congestive heart failure, diabetes, hypertension, CHF, former smoker. EXAM: CHEST  2 VIEW COMPARISON:  Chest x-ray of September 03, 2017 FINDINGS: The lungs remain mildly  hypoinflated. There is density that projects at the lung bases on the lateral view new which is likely bilateral. There is no pleural effusion or pneumothorax. The heart is mildly enlarged. The pulmonary vascularity is not engorged. The bony thorax exhibits no acute abnormality. IMPRESSION: Mild hypoinflation. Increased basilar density greatest on the left posteriorly suspicious for pneumonia. Followup PA and lateral chest X-ray is recommended in 3-4 weeks following trial of antibiotic therapy to ensure resolution and exclude  underlying malignancy. Mild cardiomegaly without pulmonary edema. Electronically Signed   By: David  Martinique M.D.   On: 09/05/2017 10:33   Dg Chest 2 View  Result Date: 09/03/2017 CLINICAL DATA:  71 year old male with shortness of breath. EXAM: CHEST  2 VIEW COMPARISON:  Chest radiograph dated 06/01/2016 FINDINGS: Shallow inspiration. No focal consolidation, pleural effusion, or pneumothorax. Stable top-normal cardiac size. No acute osseous pathology. IMPRESSION: No active cardiopulmonary disease. Electronically Signed   By: Anner Crete M.D.   On: 09/03/2017 03:45   Nm Pulmonary Perf And Vent  Result Date: 09/04/2017 CLINICAL DATA:  Chest pain. EXAM: NUCLEAR MEDICINE VENTILATION - PERFUSION LUNG SCAN TECHNIQUE: Ventilation images were obtained in multiple projections using inhaled aerosol Tc-85m DTPA. Perfusion images were obtained in multiple projections after intravenous injection of Tc-11m MAA. RADIOPHARMACEUTICALS:  30.0 mCi Technetium-17m DTPA aerosol inhalation and 4.0 mCi Technetium-81m MAA IV COMPARISON:  Chest x-ray 09/03/2017 . FINDINGS: Multiple bilateral prominent perfusion defects noted. Poor ventilatory study, associated ventilation defects may be present. This study is indeterminate high probability pulmonary embolus. IMPRESSION: Indeterminate to high probability pulmonary embolus. Electronically Signed   By: Marcello Moores  Register   On: 09/04/2017 17:37    Micro Results     Recent Results (from the past 240 hour(s))  MRSA PCR Screening     Status: Abnormal   Collection Time: 09/03/17 11:50 AM  Result Value Ref Range Status   MRSA by PCR POSITIVE (A) NEGATIVE Final    Comment:        The GeneXpert MRSA Assay (FDA approved for NASAL specimens only), is one component of a comprehensive MRSA colonization surveillance program. It is not intended to diagnose MRSA infection nor to guide or monitor treatment for MRSA infections. RESULT CALLED TO, READ BACK BY AND VERIFIED  WITH: Waymon Budge RN 14:05 09/03/17 ([email or web address])        Today   Subjective    Treshon Honsinger today is without chest pain.  Pt denies fever, chills, dyspnea, n/v, diarrhea, brbpr, black stool   no headache,no abdominal pain,no new weakness tingling or numbness, feels much better wants to go back to SNF today.    Objective   Blood pressure 119/60, pulse 76, temperature 97.8 F (36.6 C), temperature source Oral, resp. rate 18, height 6\' 1"  (1.854 m), weight (!) 160.8 kg (354 lb 8 oz), SpO2 97 %.   Intake/Output Summary (Last 24 hours) at 09/08/2017 1357 Last data filed at 09/08/2017 1309 Gross per 24 hour  Intake 2538.66 ml  Output 2001 ml  Net 537.66 ml    Exam Awake Alert, Oriented x 3, No new F.N deficits, Normal affect [place].AT,PERRAL Supple Neck,No JVD, No cervical lymphadenopathy appriciated.  Symmetrical Chest wall movement, Good air movement bilaterally, CTAB RRR, s1, s2, 2/6 sem rusb +ve B.Sounds, Abd Soft, Non tender, No organomegaly appriciated, No rebound -guarding or rigidity. No Cyanosis, Clubbing or edema     Data Review   CBC w Diff:  Lab Results  Component Value Date   WBC 10.0 09/08/2017  HGB 11.3 (L) 09/08/2017   HCT 34.6 (L) 09/08/2017   PLT 226 09/08/2017   LYMPHOPCT 16 05/29/2017   MONOPCT 7 05/29/2017   EOSPCT 4 05/29/2017   BASOPCT 1 05/29/2017    CMP:  Lab Results  Component Value Date   NA 134 (L) 09/08/2017   NA 134 (A) 08/09/2017   K 3.4 (L) 09/08/2017   CL 96 (L) 09/08/2017   CO2 28 09/08/2017   BUN 15 09/08/2017   BUN 30 (A) 08/09/2017   CREATININE 1.26 (H) 09/08/2017   CREATININE 0.81 09/20/2014   GLU 338 08/09/2017   PROT 5.9 (L) 09/08/2017   ALBUMIN 2.6 (L) 09/08/2017   BILITOT 0.3 09/08/2017   ALKPHOS 70 09/08/2017   AST 13 (L) 09/08/2017   ALT 15 (L) 09/08/2017  .   Total Time in preparing paper work, data evaluation and todays exam - 41 minutes  Jani Gravel M.D on 09/08/2017 at [facility]    Office  [phone]

## 2017-09-08 NOTE — Plan of Care (Signed)
  Progressing Safety: Ability to remain free from injury will improve 09/08/2017 0327 - Progressing by Ardine Eng, RN

## 2017-09-08 NOTE — Clinical Social Work Note (Signed)
[name] facilitated patient discharge including contacting patient family (Patient was on the phone with his brother when [name] came in to tell him he was discharging. He did not want [name] to notify any other family) and facility to confirm patient discharge plans. Clinical information faxed to facility and family agreeable with plan. [name] arranged ambulance transport via [facility] to [place] at 4:00 pm. RN to call report prior to discharge [phone] Room 216A).  [name] will sign off for now as social work intervention is no longer needed. Please consult [place] again if new needs arise.  Dayton Scrape, [place]

## 2017-09-09 ENCOUNTER — Telehealth: Payer: Self-pay

## 2017-09-09 ENCOUNTER — Encounter: Payer: Self-pay | Admitting: Adult Health

## 2017-09-09 ENCOUNTER — Non-Acute Institutional Stay (SKILLED_NURSING_FACILITY): Payer: Medicare Other | Admitting: Adult Health

## 2017-09-09 DIAGNOSIS — I2699 Other pulmonary embolism without acute cor pulmonale: Secondary | ICD-10-CM | POA: Diagnosis not present

## 2017-09-09 DIAGNOSIS — L109 Pemphigus, unspecified: Secondary | ICD-10-CM | POA: Diagnosis not present

## 2017-09-09 DIAGNOSIS — Z960 Presence of urogenital implants: Secondary | ICD-10-CM

## 2017-09-09 DIAGNOSIS — E1143 Type 2 diabetes mellitus with diabetic autonomic (poly)neuropathy: Secondary | ICD-10-CM | POA: Diagnosis not present

## 2017-09-09 DIAGNOSIS — Z7901 Long term (current) use of anticoagulants: Secondary | ICD-10-CM | POA: Diagnosis not present

## 2017-09-09 DIAGNOSIS — I110 Hypertensive heart disease with heart failure: Secondary | ICD-10-CM | POA: Diagnosis not present

## 2017-09-09 DIAGNOSIS — E1141 Type 2 diabetes mellitus with diabetic mononeuropathy: Secondary | ICD-10-CM | POA: Diagnosis not present

## 2017-09-09 DIAGNOSIS — I4891 Unspecified atrial fibrillation: Secondary | ICD-10-CM | POA: Diagnosis not present

## 2017-09-09 DIAGNOSIS — I2510 Atherosclerotic heart disease of native coronary artery without angina pectoris: Secondary | ICD-10-CM | POA: Diagnosis not present

## 2017-09-09 DIAGNOSIS — E785 Hyperlipidemia, unspecified: Secondary | ICD-10-CM

## 2017-09-09 DIAGNOSIS — I5032 Chronic diastolic (congestive) heart failure: Secondary | ICD-10-CM

## 2017-09-09 DIAGNOSIS — J3089 Other allergic rhinitis: Secondary | ICD-10-CM

## 2017-09-09 DIAGNOSIS — Z9289 Personal history of other medical treatment: Secondary | ICD-10-CM

## 2017-09-09 DIAGNOSIS — K219 Gastro-esophageal reflux disease without esophagitis: Secondary | ICD-10-CM | POA: Diagnosis not present

## 2017-09-09 DIAGNOSIS — Z978 Presence of other specified devices: Secondary | ICD-10-CM

## 2017-09-09 DIAGNOSIS — E1165 Type 2 diabetes mellitus with hyperglycemia: Secondary | ICD-10-CM | POA: Diagnosis not present

## 2017-09-09 DIAGNOSIS — E1169 Type 2 diabetes mellitus with other specified complication: Secondary | ICD-10-CM | POA: Diagnosis not present

## 2017-09-09 DIAGNOSIS — IMO0002 Reserved for concepts with insufficient information to code with codable children: Secondary | ICD-10-CM

## 2017-09-09 LAB — POCT INR: INR: 3 — AB (ref 0.9–1.1)

## 2017-09-09 LAB — PROTIME-INR: Protime: 30.2 — AB (ref 10.0–13.8)

## 2017-09-09 NOTE — Progress Notes (Signed)
Location:   [facility] Room Number: 216 B Place of Service:  SNF (31)   CODE STATUS: Full Code  Allergies  Allergen Reactions  . Ace Inhibitors Swelling    Pt tolerates lisinopril  . Lipitor [Atorvastatin Calcium] Swelling  . Metformin And Related Swelling  . Morphine And Related Other (See Comments)    Sweating, feels like is "in rocky boat."  . Robaxin [Methocarbamol] Other (See Comments)    Feels like he is shaky    Chief Complaint  Patient presents with  . Hospitalization Follow-up    Hopsital Follow up    HPI:  He is a 71 year old long term resident of this facility who has been hospitalized for acute chest pain. He was found to have an acute PE. He will be on long term coumadin therapy for the next 3-6 months. He is not complaining of any worsening shortness of breath; denies chest pain; does have a rash and dry skin. There are no nursing concerns at this time.   Past Medical History:  Diagnosis Date  . Arthritis    "hands, ankles, knees" (11/10/2014)  . Bladder spasms   . CAD (coronary artery disease)   . CAD in native artery 09/15/2015  . CHF (congestive heart failure) ([place])   . Chronic indwelling Foley catheter   . Chronic lower back pain   . Depression   . Diabetic diarrhea ([place])   . Diabetic neuropathy, painful ([place])   . DVT (deep venous thrombosis) ([facility]) 1980's   LLE  . GERD (gastroesophageal reflux disease)   . Hyperlipidemia   . Hypertension   . Neurogenic bladder   . Obese   . OSA (obstructive sleep apnea)    "they wanted me to wear a mask; I couldn't" (11/10/2014)  . Pneumonia 1999  . PONV (postoperative nausea and vomiting)   . Stroke [facility]) 10/2011; 06/2014   right hand numbness/notes 10/20/2011, pt not sure he had stroke in 2013; "right hand weaker and right face not quite right since" (11/10/2014)  . Type II diabetes mellitus ([place])   . 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 CARDIAC CATHETERIZATION N/A 05/02/2015   Procedure: Left Heart Cath and Coronary Angiography;  Surgeon: Lorretta Harp, MD;  Location: [facility] CV LAB;  Service: Cardiovascular;  Laterality: N/A;  . CARDIAC CATHETERIZATION N/A 05/04/2015   Procedure: Left Heart Cath and Coronary Angiography;  Surgeon: Wellington Hampshire, MD;  Location: [facility];  Service: Cardiovascular;  Laterality: N/A;  . CARPAL TUNNEL RELEASE Bilateral ~ 2003-2004  . CHOLECYSTECTOMY OPEN  1970's?  Marland Kitchen GASTROPLASTY    . JOINT REPLACEMENT    . LEFT HEART CATHETERIZATION WITH CORONARY ANGIOGRAM N/A 10/24/2011   Procedure: LEFT HEART CATHETERIZATION WITH CORONARY ANGIOGRAM;  Surgeon: Candee Furbish, MD;  Location: [facility] CATH LAB;  Service: Cardiovascular;  Laterality: N/A;  right radial artery approach  . TOTAL KNEE ARTHROPLASTY Right 1990's    Social History   Socioeconomic History  . Marital status: Widowed    Spouse name: Not on file  . Number of children: Not on file  . Years of education: Not on file  . Highest education level: Not on file  Social Needs  . Financial resource strain: Not on file  . Food insecurity - worry: Not on file  . Food insecurity - inability: Not on file  . Transportation needs - medical: Not  on file  . Transportation needs - non-medical: Not on file  Occupational History  . Occupation: Retired  Tobacco Use  . Smoking status: Former Smoker    Packs/day: 2.00    Years: 20.00    Pack years: 40.00    Types: Cigarettes    Last attempt to quit: 09/10/1976    Years since quitting: 41.0  . Smokeless tobacco: Never Used  Substance and Sexual Activity  . Alcohol use: No    Alcohol/week: 0.0 oz    Comment: FORMER ALCOHOLIC; "sober since 88/41/6606"  . Drug use: No  . Sexual activity: No  Other Topics Concern  . Not on file  Social History Narrative  . Not on file   Family History  Problem Relation Age of Onset  . Diabetes type I Father   .  Hypertension Mother   . Cancer Brother         Testicular Cancer  . Cancer Brother        Leukemia      VITAL SIGNS BP 112/61   Pulse 74   Temp 98.2 F (36.8 C)   Resp 20   Ht 6\' 2"  (1.88 m)   Wt (!) 348 lb 3.2 oz (157.9 kg)   SpO2 96%   BMI 44.71 kg/m   Outpatient Encounter Medications as of 09/09/2017  Medication Sig  . acetaminophen (TYLENOL) 500 MG tablet Take 1,000 mg by mouth 3 (three) times daily.  Marland Kitchen aluminum-magnesium hydroxide-simethicone (MAALOX) 301-601-09 MG/5ML SUSP Take 30 mLs by mouth every 6 (six) hours as needed (indigestion).   . Amino Acids-Protein Hydrolys (FEEDING SUPPLEMENT, PRO-STAT SUGAR FREE 64,) LIQD Take 30 mLs by mouth 2 (two) times daily.  Marland Kitchen amLODipine (NORVASC) 5 MG tablet Take 5 mg by mouth daily.  . cephALEXin (KEFLEX) 500 MG capsule Take 500 mg by mouth 3 (three) times daily.  . diclofenac sodium (VOLTAREN) 1 % GEL Apply topically 3 (three) times daily. Apply to left shoulder topically three times daily   . escitalopram (LEXAPRO) 10 MG tablet Take 10 mg by mouth daily.   Marland Kitchen ezetimibe (ZETIA) 10 MG tablet Take 10 mg by mouth daily.  . folic acid (FOLVITE) 1 MG tablet Take 1 mg by mouth daily.  Marland Kitchen gabapentin (NEURONTIN) 100 MG capsule Take 1 capsule (100 mg total) by mouth 2 (two) times daily.  Marland Kitchen guaiFENesin (MUCINEX) 600 MG 12 hr tablet Take 600 mg by mouth 2 (two) times daily.  . hydrOXYzine (ATARAX/VISTARIL) 25 MG tablet Take 25 mg by mouth 3 (three) times daily.   . Insulin Glargine (TOUJEO SOLOSTAR) 300 UNIT/ML SOPN Inject 42 Units into the skin at bedtime.   . insulin lispro (HUMALOG) 100 UNIT/ML injection Inject 12 Units into the skin 3 (three) times daily with meals.   Marland Kitchen ipratropium-albuterol (DUONEB) 0.5-2.5 (3) MG/3ML SOLN Take 3 mLs by nebulization every 6 (six) hours as needed (sob).   . methotrexate (RHEUMATREX) 10 MG tablet Take 10 mg by mouth every Wednesday. Caution: Chemotherapy. Protect from light.  . metoprolol tartrate  (LOPRESSOR) 25 MG tablet Take 12.5 mg by mouth 2 (two) times daily.   . Multiple Vitamins-Minerals (DECUBI-VITE) CAPS Take 1 capsule by mouth daily at 3 pm.  . nitroGLYCERIN (NITROSTAT) 0.4 MG SL tablet Place 0.4 mg under the tongue every 5 (five) minutes as needed for chest pain.  Marland Kitchen omeprazole (PRILOSEC) 20 MG capsule Take 20 mg by mouth daily.  . OXYGEN Inhale into the lungs. O2 at 2l/min via nasal canula as  needed for shortness of breath  . potassium chloride (K-DUR) 10 MEQ tablet Take 10 mEq by mouth 4 (four) times daily.   . predniSONE (DELTASONE) 10 MG tablet Take 10 mg by mouth daily.  Marland Kitchen rOPINIRole (REQUIP) 0.5 MG tablet Take 0.5 mg by mouth at bedtime. 2100  . sennosides-docusate sodium (SENOKOT-S) 8.6-50 MG tablet Take 2 tablets by mouth at bedtime. 2100  . torsemide (DEMADEX) 20 MG tablet Take 20 mg by mouth daily.   . traMADol (ULTRAM) 50 MG tablet Take 2 tablets (100 mg total) by mouth 3 (three) times daily. Hold if Lethargic or confused  . traZODone (DESYREL) 50 MG tablet Take 50 mg by mouth at bedtime.   Marland Kitchen warfarin (COUMADIN) 5 MG tablet Take 1 tablet (5 mg total) by mouth daily at 6 PM.   No facility-administered encounter medications on file as of 09/09/2017.      SIGNIFICANT DIAGNOSTIC EXAMS  PREVIOUS  10-18-15: EEG: This is a normal EEG for the patients stated age.  There were no focal, hemispheric or lateralizing features.  No epileptiform activity was recorded.  A normal EEG does not exclude the diagnosis of a seizure disorder and if seizure remains high on the list of differential diagnosis, an ambulatory EEG may be of value.  Clinical correlation is required.  01-11-16: ct of head: No acute intracranial abnormality. Stable mild cerebral atrophy and chronic white matter disease. No definite acute cortical infarction.  01-11-16: scrotal ultrasound: 1. Left epididymo-orchitis. 2. Microlithiasis involving the left testicle. 3. Three adjacent complex cysts/spermatoceles  involving the left epididymal head.  05-27-17: ct of abdomen and pelvis  1. Negative for hydronephrosis. Nonspecific perinephric fat stranding. Foley catheter is present in the bladder which is empty, the tip of the catheter appears to tent the superior wall of the bladder. 2. There are no acute intra-abdominal or pelvic abnormalities seen. 3. 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 Bilateral hip joint degenerative changes, right greater than left but no hip fracture, AVN or findings to suggest septic arthritis. 2. Significant artifact from the patient's skin touching the gantry. Suspect mild cellulitis but no obvious abscess, myofasciitis or Pyomyositis.  TODAY:   07-31-17: left shoulder x-ray: marked degenerative joint disease of the left shoulder; otherwise no fracture or dislocation seen   09-03-17: chest x-ray: No active cardiopulmonary disease.   09-04-17: NM pulmonary perf and vent: Indeterminate to high probability pulmonary embolus.   09-04-17: TEE:   - Procedure narrative: Transthoracic echocardiography. Image quality was suboptimal. The study was technically difficult, as a result of poor acoustic windows, poor sound wave transmission, poor patient compliance, restricted patient mobility, and body habitus. Intravenous contrast (Definity) was administered. - Left ventricle: The cavity size was normal. Wall thickness was increased in a pattern of mild LVH. Systolic function was normal. The estimated ejection fraction was in the range of 50% to 55%.  Wall motion was normal; there were no regional wall motion abnormalities. Doppler parameters are consistent with restrictive physiology, indicative of decreased left ventricular diastolic compliance and/or increased left atrial pressure. - Aortic valve: Valve mobility was restricted. There was mild stenosis. . - Aortic root: The aortic root was mildly dilated. - Mitral valve: Calcified  annulus. Impressions: - Technically difficult; definity used; normal LV systolic function; mild LVH; restrictive filling; calcified aortic valve  with mild ASl; mildly dilated aortic root.  09-05-17: chest x-ray: Mild hypoinflation. Increased basilar density greatest on the left posteriorly suspicious for pneumonia.  Followup PA and lateral chest X-ray is recommended in 3-4 weeks following trial of antibiotic therapy to ensure resolution and exclude underlying malignancy. Mild cardiomegaly without pulmonary edema.    LABS REVIEWED: PREVIOUS     12-13-16: urine micro-albumin 54.44 01-29-17: wbc 15.0; hgb 12.6; hct 38.1; mcv 93.0; plt 255; glucose 418; bun 29.1 creat 1.03; k+ 4.1; n++ 136; ca 8.6; liver normal albumin 3.3; hgb a1c 9.1  05-08-17: hgb a1c 9.8; chol 190; ldl 92; trig 299; hdl 38  05-27-17: wbc 17.5; hgb 11.8; hct 35.8; mcv 90.2; plt 271; glucose 222; bun 16; creat 0.95; k+ 3.6; na++ 135; ca 8.4; CRP 17.1; sed rate 48; blood culture: no growth; urine culture: e-coli: esbl 05-28-17: wbc 13.3; hgb 11.5; hct 34.9; mcv 88.4; plt 252; glucose 208; bun 14; creat 0.87; k+ 3.2; na++ 135; ca 8.1 AM cortisol 9.5 05-30-17: wbc 7.9; hgb 11.4; hct 35.0; mcv 88.6; plt 279; glucose 175; bun 7; creat 0.74; k+ 3.5; na++ 134; ca 8.2  06-02-17: folate 19.0; vit B12: 357; ferritin 123; iron 45; TIBC 227 06-04-17: wbc 10.0; hgb 10.6; hct 33.3; mcv 91.0; plt 273 07-10-17: hgb a1c 8.8  TODAY:   08-09-17: wbc 9.6; hgb 12.4; hct 36.6; mcv 89.9; plt 203; glucose 338; bun 30.3;creat 0.84; k+ 4.8; na++ 134; liver normal albumin 3,2  09-03-17: wbc 11.8; hgb 13.1; hct 39.1; mcv 89.7; plt 266; glucose 307; bun 23; creat 2.03; k+ 6.5; na++ 131; ca 8.5; mag 1.6; BNP 100.7; hgb a1c 10.3;  09-04-17: glucose 283 bun 19; creat 1.03; k+ 4.2; na++ 134; ca 8.2 09-08-17: wbc 10.0; hgb 11.3; hct 34.6; mcv 91.5; plt 226; glucose 190; bun 15; creat 1.26; k+ 3.4; na++ 134; ca 8.3; liver normal albumin 2.6    Review of Systems   Constitutional: Negative for malaise/fatigue.  Respiratory: Negative for cough and shortness of breath.   Cardiovascular: Negative for chest pain, palpitations and leg swelling.  Gastrointestinal: Negative for abdominal pain, constipation and heartburn.  Musculoskeletal: Negative for back pain, joint pain and myalgias.  Skin: Negative.   Neurological: Negative for dizziness.  Psychiatric/Behavioral: The patient is not nervous/anxious.      Physical Exam  Constitutional: He is oriented to person, place, and time. He appears well-developed and well-nourished. No distress.  Obese   Neck: Carotid bruit is present. No thyromegaly present.  Cardiovascular: Normal rate, regular rhythm and intact distal pulses.  Murmur heard. Pulmonary/Chest: Effort normal and breath sounds normal. No respiratory distress.  Abdominal: Soft. Bowel sounds are normal. He exhibits no distension. There is no tenderness.  Genitourinary:  Genitourinary Comments: Penile tear Foley present   Musculoskeletal: He exhibits no edema.  Is able to move all extremities   Lymphadenopathy:    He has no cervical adenopathy.  Neurological: He is alert and oriented to person, place, and time.  Skin: Skin is warm and dry. He is not diaphoretic.  Has fine red areas; with extreme dry skin present.  Lower legs discolored   Psychiatric: He has a normal mood and affect.         ASSESSMENT/ PLAN:   TODAY:   1. Dyslipidemia: stable  ldl 92; will continue zetia 10 mg daily   2. Diabetes: worse hgb a1c 10.3 (previous  hgb a1c 8.8): will continue tuojeo 42 units nightly and humalog 12 units with meals  3.  Allergic rhinitis: stable will continue mucinex 600 mg twice daily and duoneb every 6 hours as needed   4. Bullous pemphigoid: stable will  continue methotrexte 10 mg weekly with folic acid 1 mg daily and is on long term prednisone 10 mg daily   5. Restless leg syndrome: is stable will continue requip 0.5 mg nightly   6.  Depression: is stable will continue lexapro 10 mg daily and takes trazodone 50 mg nightly   7. Constipation: stable will continue senna s 2 tabs daily   8. Pruritis: stable will continue atarax 25 mg three times daily   9. 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 Left shoulder pain: has had steroid injection done: will continue ultram 100 mg three times daily with tylenol 1 gm three times daily using voltaren gel to left shoulder three times daily   11. Hypertension: is stable; b/p 112/61: will continue norvasc 5 mg daily lopressor 12.5 mg twice daily   12. Chronic diastolic heart failure: stable; will continue demadex 20 mg  daily   asa 81 mg daily lopressor 12.5 mg twice daily and has ntg 0.4 mg every 5 mins X 3 as needed  13.  GERD: stable will continue prilosec 20 mg daily   14. Diabetic neuropathy: stable hgb a1c 10.3: will continue neurontin 100 mg twice daily  15. Hypokalemia: k+ 3.4; will continue klor con 10 meq tabs four times daily will monitor  He will decline k+ at times   16. CKD stage III: bun 15; creat 1.26; will monitor   17. PE: is stable is on coumadin therapy 5 mg nightly will monitor INR.      MD is aware of resident's narcotic use and is in agreement with current plan of care. We will attempt to wean resident as apropriate    Ok Edwards NP [facility]  Contact [phone] Monday through Friday 8am- 5pm  After hours call [phone]

## 2017-09-09 NOTE — Telephone Encounter (Signed)
This is a patient of [place], who was admitted to [facility] after hospitalization. [facility] F/U is needed. Hospital discharge from [facility] on 09/08/2017.

## 2017-09-11 DIAGNOSIS — E119 Type 2 diabetes mellitus without complications: Secondary | ICD-10-CM | POA: Diagnosis not present

## 2017-09-11 DIAGNOSIS — Z7901 Long term (current) use of anticoagulants: Secondary | ICD-10-CM | POA: Diagnosis not present

## 2017-09-11 DIAGNOSIS — D649 Anemia, unspecified: Secondary | ICD-10-CM | POA: Diagnosis not present

## 2017-09-11 LAB — CBC AND DIFFERENTIAL
HCT: 40 — AB (ref 41–53)
HEMOGLOBIN: 13 — AB (ref 13.5–17.5)
NEUTROS ABS: 7
PLATELETS: 256 (ref 150–399)
WBC: 11.4

## 2017-09-11 LAB — BASIC METABOLIC PANEL
BUN: 25 — AB (ref 4–21)
Creatinine: 1.1 (ref 0.6–1.3)
Glucose: 396
Potassium: 5.2 (ref 3.4–5.3)
Sodium: 396 — AB (ref 137–147)

## 2017-09-16 DIAGNOSIS — Z7901 Long term (current) use of anticoagulants: Secondary | ICD-10-CM | POA: Diagnosis not present

## 2017-09-16 DIAGNOSIS — I4891 Unspecified atrial fibrillation: Secondary | ICD-10-CM | POA: Diagnosis not present

## 2017-09-20 DIAGNOSIS — Z7901 Long term (current) use of anticoagulants: Secondary | ICD-10-CM | POA: Diagnosis not present

## 2017-09-20 DIAGNOSIS — I4891 Unspecified atrial fibrillation: Secondary | ICD-10-CM | POA: Diagnosis not present

## 2017-09-23 DIAGNOSIS — I4891 Unspecified atrial fibrillation: Secondary | ICD-10-CM | POA: Diagnosis not present

## 2017-09-23 DIAGNOSIS — I2609 Other pulmonary embolism with acute cor pulmonale: Secondary | ICD-10-CM | POA: Diagnosis not present

## 2017-09-23 DIAGNOSIS — I82891 Chronic embolism and thrombosis of other specified veins: Secondary | ICD-10-CM | POA: Diagnosis not present

## 2017-09-26 DIAGNOSIS — Z7901 Long term (current) use of anticoagulants: Secondary | ICD-10-CM | POA: Diagnosis not present

## 2017-09-26 DIAGNOSIS — I4891 Unspecified atrial fibrillation: Secondary | ICD-10-CM | POA: Diagnosis not present

## 2017-09-28 DIAGNOSIS — I4891 Unspecified atrial fibrillation: Secondary | ICD-10-CM | POA: Diagnosis not present

## 2017-09-28 DIAGNOSIS — Z7901 Long term (current) use of anticoagulants: Secondary | ICD-10-CM | POA: Diagnosis not present

## 2017-09-28 LAB — PROTIME-INR: Protime: 30 — AB (ref 10.0–13.8)

## 2017-09-28 LAB — POCT INR: INR: 3 — AB (ref 0.9–1.1)

## 2017-10-01 DIAGNOSIS — Z7901 Long term (current) use of anticoagulants: Secondary | ICD-10-CM | POA: Diagnosis not present

## 2017-10-01 DIAGNOSIS — I4891 Unspecified atrial fibrillation: Secondary | ICD-10-CM | POA: Diagnosis not present

## 2017-10-01 LAB — POCT INR: INR: 1.4 — AB (ref 0.9–1.1)

## 2017-10-01 LAB — PROTIME-INR: PROTIME: 16.5 — AB (ref 10.0–13.8)

## 2017-10-04 DIAGNOSIS — J80 Acute respiratory distress syndrome: Secondary | ICD-10-CM | POA: Diagnosis not present

## 2017-10-04 DIAGNOSIS — L03119 Cellulitis of unspecified part of limb: Secondary | ICD-10-CM | POA: Diagnosis not present

## 2017-10-24 ENCOUNTER — Encounter: Payer: Self-pay | Admitting: Internal Medicine

## 2017-10-24 ENCOUNTER — Non-Acute Institutional Stay (SKILLED_NURSING_FACILITY): Payer: Medicare Other | Admitting: Internal Medicine

## 2017-10-24 DIAGNOSIS — Z79899 Other long term (current) drug therapy: Secondary | ICD-10-CM

## 2017-10-24 DIAGNOSIS — E1169 Type 2 diabetes mellitus with other specified complication: Secondary | ICD-10-CM

## 2017-10-24 DIAGNOSIS — Z8673 Personal history of transient ischemic attack (TIA), and cerebral infarction without residual deficits: Secondary | ICD-10-CM | POA: Diagnosis not present

## 2017-10-24 DIAGNOSIS — IMO0002 Reserved for concepts with insufficient information to code with codable children: Secondary | ICD-10-CM

## 2017-10-24 DIAGNOSIS — I2699 Other pulmonary embolism without acute cor pulmonale: Secondary | ICD-10-CM

## 2017-10-24 DIAGNOSIS — R21 Rash and other nonspecific skin eruption: Secondary | ICD-10-CM | POA: Diagnosis not present

## 2017-10-24 DIAGNOSIS — E785 Hyperlipidemia, unspecified: Secondary | ICD-10-CM | POA: Diagnosis not present

## 2017-10-24 DIAGNOSIS — L109 Pemphigus, unspecified: Secondary | ICD-10-CM | POA: Diagnosis not present

## 2017-10-24 DIAGNOSIS — E1143 Type 2 diabetes mellitus with diabetic autonomic (poly)neuropathy: Secondary | ICD-10-CM | POA: Diagnosis not present

## 2017-10-24 DIAGNOSIS — E1165 Type 2 diabetes mellitus with hyperglycemia: Secondary | ICD-10-CM

## 2017-10-24 NOTE — Progress Notes (Signed)
:  [name] Room Number: [phone] Place of Service:  SNF [id number]) Provider:  DR Chihiro Frey Gwendalyn Ege, DO  Patient Care Team: Gildardo Cranker, DO as PCP - [facility]  Extended Emergency Contact Information Primary Emergency Contact: Whiting,[name] Address: [address]          [place], [place] [address] [name] Phone: [phone] Relation: Friend Secondary Emergency Contact: [place]etta of [place] Phone: [phone] Mobile Phone: [phone] Relation: Brother  Code Status:   Goals of care: Advanced Directive information Advanced Directives 09/09/2017  Does Patient Have a Medical Advance Directive? Yes  Type of Advance Directive Out of facility DNR (pink MOST or yellow form)  Does patient want to make changes to medical advance directive? No - Patient declined  Copy of [place] in Chart? -  Would patient like information on creating a medical advance directive? -  Pre-existing out of facility DNR order (yellow form or pink MOST form) Pink MOST form placed in chart (order not valid for inpatient use)     Chief Complaint  Patient presents with  . Medical Management of Chronic Issues    Medical Management of Chronic Issues. [employer]    HPI:  Pt is a 72 y.o. male seen today for medical management of chronic diseases.  He c/o painful rash at left shoulder blade x unknown duration. he is using topical triamcinolone on the area. He also c/o that dietary sends him food he cannot eat such as sweet potatoes. Per nursing, he is not bathing and refuses showers. He states foley leaks. No other concerns. No falls. No f/c. Appetite excellent and sleeps well overall. Reviewed SNF labs - Na 132; K 5.2; Cr 1.07; albumin 3.4; Hgb 13; A1c 8.8%  Dyslipidemia - stable on zetia 10 mg daily. LDL 92  DM - insulin  dependent and uncontrolled. A1c 8.8% (previous  A1c 10.3%). Takes tuojeo 42 units nightly and humalog 12 units with meals. 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 10 mg weekly with folic acid 1 mg daily; long term prednisone 10 mg daily   Restless leg syndrome - stable on requip 0.5 mg nightly   Depression - mood stable on lexapro 10 mg daily and trazodone 50 mg nightly   Chronic Constipation - stable on senna s 2 tabs daily   Pruritis - stable on atarax 25 mg three times daily   chronic Left shoulder pain - he has had a steroid injection in the past; pain stable on ultram 100 mg three times daily with tylenol 1 gm three times daily; voltaren gel to left shoulder three times daily   Hypertension - BP stable on norvasc 5 mg daily; lopressor 12.5 mg twice daily   Chronic diastolic heart failure - stable on demadex 20 mg  Daily; ASA 81 mg daily; lopressor 12.5 mg twice daily; SL NTG 0.4 mg every 5 mins X 3 as needed  GERD - stable on prilosec 20 mg daily   Hypokalemia - stable on klor con 10 meq tabs four times daily. He does refuse med occasionally. K 5.2  CKD - stage 3. Cr 1.07   PE - dx in Dec 2018. Coumadin was difficult to  manage due to fluctuating INR and he was switched to xeralto. No bleeding.  Past Medical History:  Diagnosis Date  . Arthritis    "hands, ankles, knees" (11/10/2014)  . Bladder spasms   . CAD (coronary artery disease)   . CAD in native artery 09/15/2015  . CHF (congestive heart failure) ([facility])   . Chronic indwelling Foley catheter   . Chronic lower back pain   . Depression   . Diabetic diarrhea ([facility])   . Diabetic neuropathy, painful ([facility])   . DVT (deep venous thrombosis) ([facility]) 1980's   LLE  . GERD (gastroesophageal reflux disease)   . Hyperlipidemia   . Hypertension   . Neurogenic bladder   .  Obese   . OSA (obstructive sleep apnea)    "they wanted me to wear a mask; I couldn't" (11/10/2014)  . Pneumonia 1999  . PONV (postoperative nausea and vomiting)   . Stroke [facility]) 10/2011; 06/2014   right hand numbness/notes 10/20/2011, pt not sure he had stroke in 2013; "right hand weaker and right face not quite right since" (11/10/2014)  . Type II diabetes mellitus ([facility] [facility])   . Uncontrolled type II diabetes with peripheral autonomic neuropathy ([place]) 05/09/2015   Past Surgical History:  Procedure Laterality Date  . CARDIAC CATHETERIZATION N/A 05/02/2015   Procedure: Left Heart Cath and Coronary Angiography;  Surgeon: Lorretta Harp, MD;  Location: [facility];  Service: Cardiovascular;  Laterality: N/A;  . CARDIAC CATHETERIZATION N/A 05/04/2015   Procedure: Left Heart Cath and Coronary Angiography;  Surgeon: Wellington Hampshire, MD;  Location: [facility];  Service: Cardiovascular;  Laterality: N/A;  . CARPAL TUNNEL RELEASE Bilateral ~ 2003-2004  . CHOLECYSTECTOMY OPEN  1970's?  Marland Kitchen GASTROPLASTY    . JOINT REPLACEMENT    . LEFT HEART CATHETERIZATION WITH CORONARY ANGIOGRAM N/A 10/24/2011   Procedure: LEFT HEART CATHETERIZATION WITH CORONARY ANGIOGRAM;  Surgeon: Candee Furbish, MD;  Location: [facility] C[facility] LAB;  Service: Cardiovascular;  Laterality: N/A;  right radial artery approach  . TOTAL KNEE ARTHROPLASTY Right 1990's    Allergies  Allergen Reactions  . Ace Inhibitors Swelling    Pt tolerates lisinopril  . Lipitor [Atorvastatin Calcium] Swelling  . Metformin And Related Swelling  . Morphine And Related Other (See Comments)    Sweating, feels like is "in rocky boat."  . Robaxin [Methocarbamol] Other (See Comments)    Feels like he is shaky    Outpatient Encounter Medications as of 10/24/2017  Medication Sig  . acetaminophen (TYLENOL) 500 MG tablet Take 1,000 mg by mouth 3 (three) times daily.  Marland Kitchen aluminum-magnesium hydroxide-simethicone (MAALOX) [phone] MG/5ML SUSP Take 30 mLs by  mouth every 6 (six) hours as needed (indigestion).   . Amino Acids-Protein Hydrolys (FEEDING SUPPLEMENT, PRO-STAT SUGAR FREE 64,) LIQD Take 30 mLs by mouth 2 (two) times daily.  Marland Kitchen amLODipine (NORVASC) 5 MG tablet Take 5 mg by mouth daily.  . cephALEXin (KEFLEX) 500 MG capsule Take 500 mg by mouth 3 (three) times daily.  . diclofenac sodium (VOLTAREN) 1 % GEL Apply topically 3 (three) times daily. Apply to left shoulder topically three times daily   . escitalopram (LEXAPRO) 10 MG tablet Take 10 mg by mouth daily.   Marland Kitchen ezetimibe (ZETIA) 10 MG tablet Take 10 mg by mouth daily.  . folic acid (FOLVITE) 1 MG tablet Take 1 mg by mouth daily.  Marland Kitchen gabapentin (NEURONTIN) 100 MG capsule Take 1 capsule (100 mg total) by mouth 2 (two) times daily. (Patient  taking differently: Take 200 mg by mouth 2 (two) times daily. )  . guaiFENesin (MUCINEX) 600 MG 12 hr tablet Take 600 mg by mouth 2 (two) times daily.  . hydrOXYzine (ATARAX/VISTARIL) 25 MG tablet Take 25 mg by mouth 3 (three) times daily.   . Insulin Glargine (TOUJEO SOLOSTAR) 300 UNIT/ML SOPN Inject 42 Units into the skin at bedtime.   . insulin lispro (HUMALOG) 100 UNIT/ML injection Inject 15 Units into the skin 3 (three) times daily with meals.   Marland Kitchen ipratropium-albuterol (DUONEB) 0.5-2.5 (3) MG/3ML SOLN Take 3 mLs by nebulization every 6 (six) hours as needed (sob).   Marland Kitchen linagliptin (TRADJENTA) 5 MG TABS tablet Take 5 mg by mouth daily.  . methotrexate (RHEUMATREX) 10 MG tablet Take 10 mg by mouth every Wednesday. Caution: Chemotherapy. Protect from light.  . metoprolol tartrate (LOPRESSOR) 25 MG tablet Take 12.5 mg by mouth 2 (two) times daily.   . Multiple Vitamins-Minerals (DECUBI-VITE) CAPS Take 1 capsule by mouth daily at 3 pm.  . nitroGLYCERIN (NITROSTAT) 0.4 MG SL tablet Place 0.4 mg under the tongue every 5 (five) minutes as needed for chest pain.  Marland Kitchen omeprazole (PRILOSEC) 20 MG capsule Take 20 mg by mouth daily.  . OXYGEN Inhale into the lungs. O2  at 2l/min via nasal canula as needed for shortness of breath  . potassium chloride (K-DUR) 10 MEQ tablet Take 10 mEq by mouth 4 (four) times daily.   . predniSONE (DELTASONE) 10 MG tablet Take 10 mg by mouth daily.  . rivaroxaban (XARELTO) 10 MG TABS tablet Take 10 mg by mouth daily.  Marland Kitchen rOPINIRole (REQUIP) 0.5 MG tablet Take 0.5 mg by mouth at bedtime. 2100  . sennosides-docusate sodium (SENOKOT-S) 8.6-50 MG tablet Take 2 tablets by mouth at bedtime. 2100  . torsemide (DEMADEX) 20 MG tablet Take 20 mg by mouth daily.   . traMADol (ULTRAM) 50 MG tablet Take 2 tablets (100 mg total) by mouth 3 (three) times daily. Hold if Lethargic or confused  . traZODone (DESYREL) 50 MG tablet Take 50 mg by mouth at bedtime.   . triamcinolone cream (KENALOG) 0.1 % Apply 1 application topically 2 (two) times daily.  Marland Kitchen warfarin (COUMADIN) 5 MG tablet Take 1 tablet (5 mg total) by mouth daily at 6 PM.   No facility-administered encounter medications on file as of 10/24/2017.     Review of Systems  Musculoskeletal: Positive for arthralgias and gait problem.  Skin: Positive for rash.  Neurological: Positive for numbness.  All other systems reviewed and are negative.   Immunization History  Administered Date(s) Administered  . Influenza Split 10/12/2011, 10/13/2011  . Influenza,inj,Quad PF,6+ Mos 05/06/2015  . Influenza-Unspecified 06/10/2014, 07/05/2017  . PPD Test 10/13/2015, 10/27/2015, 05/25/2016   Pertinent  Health Maintenance Due  Topic Date Due  . COLONOSCOPY  12/12/2017 (Originally 11/12/1997)  . PNA vac Low Risk Adult (1 of 2 - PCV13) 12/12/2017 (Originally 11/12/2012)  . OPHTHALMOLOGY EXAM  07/16/2018 (Originally 05/17/2017)  . FOOT EXAM  10/30/2017  . HEMOGLOBIN A1C  03/04/2018  . URINE MICROALBUMIN  03/28/2018  . INFLUENZA VACCINE  Completed   Fall Risk  03/28/2017 10/24/2015 09/28/2015 09/22/2015 07/15/2015  Falls in the past year? Yes Yes Yes Yes Yes  Number falls in past yr: 2 or more 2 or more  2 or more 2 or more 2 or more  Injury with Fall? - Yes Yes Yes Yes  Risk Factor Category  - High Fall Risk High Fall Risk High Fall Risk  High Fall Risk  Risk for fall due to : - History of fall(s);Impaired balance/gait;Impaired mobility History of fall(s);Impaired balance/gait;Impaired mobility Impaired mobility;Impaired balance/gait Impaired balance/gait;Impaired mobility  Follow up - Education provided;Falls prevention discussed Education provided;Falls prevention discussed Education provided;Falls prevention discussed Education provided;Falls prevention discussed   Functional Status Survey:    Vitals:   10/24/17 1357  BP: 130/72  Pulse: 66  Resp: 16  Temp: 98.7 F (37.1 C)  TempSrc: Oral  SpO2: 98%  Weight: (!) 348 lb 3.2 oz (157.9 kg)  Height: 6\' 2"  (1.88 m)   Body mass index is 44.71 kg/m. Physical Exam  Constitutional: He is oriented to person, place, and time. He appears well-developed and well-nourished.  Sitting up in bed in NAD; he has a body odor  HENT:  Mouth/Throat: Oropharynx is clear and moist.  MMM; no oral thrush  Eyes: Pupils are equal, round, and reactive to light. No scleral icterus.  Neck: Neck supple. Carotid bruit is not present. No thyromegaly present.  Cardiovascular: Normal rate, regular rhythm and intact distal pulses. Exam reveals no gallop and no friction rub.  Murmur (1/6 SEM) heard. No distal LE edema. No calf TTP  Pulmonary/Chest: Effort normal and breath sounds normal. He has no wheezes. He has no rales. He exhibits no tenderness.  Abdominal: Soft. Normal appearance and bowel sounds are normal. He exhibits no distension, no abdominal bruit, no pulsatile midline mass and no mass. There is no hepatomegaly. There is no tenderness. There is no rigidity, no rebound and no guarding. No hernia.  obese  Genitourinary:  Genitourinary Comments: Foley cath intact and DTG clear yellow urine  Musculoskeletal: He exhibits edema.  Lymphadenopathy:    He has  no cervical adenopathy.  Neurological: He is alert and oriented to person, place, and time. He has normal reflexes. He displays atrophy (marked changes R>L hand muscles).  Skin: Skin is warm and dry. Rash (red TTP nodules left shoulder blade) noted.  Psychiatric: He has a normal mood and affect. His behavior is normal. Judgment and thought content normal.   Diabetic Foot Exam - Simple   Simple Foot Form Diabetic Foot exam was performed with the following findings:  Yes 10/24/2017 10:40 PM  Visual Inspection No deformities, no ulcerations, no other skin breakdown bilaterally:  Yes Sensation Testing Pulse Check Posterior Tibialis and Dorsalis pulse intact bilaterally:  Yes Comments B/l toenail hypertrophy and elongated with dry skin and calluses but no ulceration     Labs reviewed: Recent Labs    05/29/17 1417  09/03/17 1216 09/04/17 0940 09/05/17 0636 09/08/17 0443  NA  --    < >  --  134* 138 134*  K  --    < > 6.0* 4.2 3.8 3.4*  CL  --    < >  --  99* 101 96*  CO2  --    < >  --  22 27 28   GLUCOSE  --    < >  --  283* 137* 190*  BUN  --    < >  --  19 13 15   CREATININE  --    < >  --  1.03 0.94 1.26*  CALCIUM  --    < >  --  8.2* 8.8* 8.3*  MG 1.8  --  1.6*  --  1.7  --    < > = values in this interval not displayed.   Recent Labs    01/29/17 08/09/17 09/08/17 0443  AST 9* 11*  13*  ALT 13  19 14  15*  ALKPHOS 90 92 70  BILITOT  --   --  0.3  PROT  --   --  5.9*  ALBUMIN  --   --  2.6*   Recent Labs    01/29/17  05/29/17 0432  08/09/17  09/05/17 0636 09/06/17 0734 09/08/17 0443  WBC 15.0   < > 8.9   < > 9.6   < > 12.3* 10.4 10.0  NEUTROABS 12  --  6.5  --  7  --   --   --   --   HGB 12.6*   < > 10.2*   < > 12.4*   < > 12.8* 11.5* 11.3*  HCT 38*   < > 30.6*   < > 37*   < > 38.9* 35.3* 34.6*  MCV  --    < > 90.0   < >  --    < > 91.3 91.7 91.5  PLT 255   < > 242   < > 203   < > 198 231 226   < > = values in this interval not displayed.   Lab Results    Component Value Date   TSH 2.954 09/15/2015   Lab Results  Component Value Date   HGBA1C 10.3 (H) 09/03/2017   Lab Results  Component Value Date   CHOL 190 05/07/2017   HDL 38 05/07/2017   LDLCALC 92 05/07/2017   TRIG 299 (A) 05/07/2017   CHOLHDL 5.4 04/30/2015    Significant Diagnostic Results in last 30 days:  No results found.  Assessment/Plan   ICD-10-CM   1. Rash of back - appears inflamed R21   2. Bullous pemphigus L10.9   3. Uncontrolled type II diabetes with peripheral autonomic neuropathy ([facility]) E11.43    E11.65   4. Dyslipidemia associated with type 2 diabetes mellitus ([place]) E11.69    E78.5   5. Other acute pulmonary embolism without acute cor pulmonale ([facility]CC) I26.99   6. H/O: CVA (cerebrovascular accident) Z3.73   7. High risk medication use Z79.899     F/u with Ortho as scheduled  Cont current meds as ordered  May need to start abx for tx of rash   PT/OT/St as indicated  Wound care as ordered  Discussed importance of hygiene and impact on health  [employer] NP to follow  Will follow  Labs/tests ordered: a1c, bmp   [name]  [facility] [address] [place], [place] [address] [phone] Cell (Monday-Friday 8 AM - 5 PM) [phone] After 5 PM and follow prompts

## 2017-10-25 LAB — BASIC METABOLIC PANEL
BUN: 14 (ref 4–21)
Creatinine: 0.9 (ref 0.6–1.3)
GLUCOSE: 265
Potassium: 4.7 (ref 3.4–5.3)
Sodium: 136 — AB (ref 137–147)

## 2017-10-25 LAB — HEMOGLOBIN A1C: Hemoglobin A1C: 9.8

## 2017-11-18 LAB — BASIC METABOLIC PANEL
BUN: 24 — AB (ref 4–21)
Creatinine: 0.9 (ref 0.6–1.3)
GLUCOSE: 184
Potassium: 4.3 (ref 3.4–5.3)
Sodium: 136 — AB (ref 137–147)

## 2017-12-16 ENCOUNTER — Encounter: Payer: Self-pay | Admitting: Internal Medicine

## 2017-12-16 ENCOUNTER — Non-Acute Institutional Stay (SKILLED_NURSING_FACILITY): Payer: Medicare Other | Admitting: Internal Medicine

## 2017-12-16 DIAGNOSIS — Z960 Presence of urogenital implants: Secondary | ICD-10-CM | POA: Diagnosis not present

## 2017-12-16 DIAGNOSIS — Z978 Presence of other specified devices: Secondary | ICD-10-CM

## 2017-12-16 DIAGNOSIS — Z79899 Other long term (current) drug therapy: Secondary | ICD-10-CM

## 2017-12-16 DIAGNOSIS — I2699 Other pulmonary embolism without acute cor pulmonale: Secondary | ICD-10-CM | POA: Diagnosis not present

## 2017-12-16 DIAGNOSIS — L109 Pemphigus, unspecified: Secondary | ICD-10-CM

## 2017-12-16 DIAGNOSIS — L03312 Cellulitis of back [any part except buttock]: Secondary | ICD-10-CM

## 2017-12-16 DIAGNOSIS — IMO0002 Reserved for concepts with insufficient information to code with codable children: Secondary | ICD-10-CM

## 2017-12-16 DIAGNOSIS — E1143 Type 2 diabetes mellitus with diabetic autonomic (poly)neuropathy: Secondary | ICD-10-CM

## 2017-12-16 DIAGNOSIS — E1165 Type 2 diabetes mellitus with hyperglycemia: Secondary | ICD-10-CM

## 2017-12-16 NOTE — Progress Notes (Signed)
Location:  [facility] Room Number: [place] of Service:  SNF [phone]) Provider:  [place], Rockwell, DO  Patient Care Team: Gildardo Cranker, DO as PCP - [facility], [place] ([place])  Extended Emergency Contact Information Primary Emergency Contact: Whiting,[name] Address: [address]          [place], [place] [address] [name] of [place] Phone: [phone] Relation: Friend Secondary Emergency Contact: [place] of [place] Phone: [phone] Mobile Phone: [phone] Relation: Brother  Code Status:  Full Code Goals of care: Advanced Directive information Advanced Directives 12/16/2017  Does Patient Have a Medical Advance Directive? Yes  Type of Advance Directive Out of facility DNR (pink MOST or yellow form)  Does patient want to make changes to medical advance directive? No - Patient declined  Copy of [place] in Chart? -  Would patient like information on creating a medical advance directive? -  Pre-existing out of facility DNR order (yellow form or pink MOST form) Pink MOST form placed in chart (order not valid for inpatient use)     Chief Complaint  Patient presents with  . Medical Management of Chronic Issues    [facility]    HPI:  Pt is a 72 y.o. male seen today for medical management of chronic diseases.  [facility] NP has tx pt with doxy for right upper back cellulitis. Infection still present after completion of abx. He is now on clinda but not showing signs of improvement. Area has d/c and very red. He is uncontrolled DM due to noncompliance with diet, medications and care recommendations. CBGs 200-400s. He is on MTX and prednisone for bullous pemphigoid. He has not had a shower in several weeks.  Dyslipidemia - stable on zetia 10 mg daily. lipitor caused swelling. LDL 92  DM - uncontrolled but improved A1c 9.8% (prev  10.3%). He gets Tuojeo 42 units nightly and humalog 12 units with meals. 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 Pain controlled on ultram 100 mg three times daily with tylenol 1 gm three times daily; voltaren gel to left shoulder three times daily   Hypertension - stable on norvasc 5 mg daily; lopressor 12.5 mg twice daily   Chronic diastolic heart failure - stable on demadex 20 mg  Daily; ASA 81 mg daily; lopressor 12.5 mg twice daily; NTG SL 0.4 mg every 5 mins X 3 as needed  GERD - stable on prilosec 20 mg daily  Hypokalemia - stable on KCl 10 meq tabs four times daily. He is occasionally noncompliant with med  Hx CKD - stage 3. Cr 0.9   Hx PE - stable on xeralto  Past Medical History:  Diagnosis Date  . Arthritis    "hands, ankles, knees" (11/10/2014)  . Bladder spasms   .  CAD (coronary artery disease)   . CAD in native artery 09/15/2015  . CHF (congestive heart failure) ([place])   . Chronic indwelling Foley catheter   . Chronic lower back pain   . Depression   . Diabetic diarrhea ([place])   . Diabetic neuropathy, painful ([place])   . DVT (deep venous thrombosis) (H[facility]) 1980's   LLE  . GERD (gastroesophageal reflux disease)   . Hyperlipidemia   . Hypertension   . Neurogenic bladder   . Obese   . OSA (obstructive sleep apnea)    "they wanted me to wear a mask; I couldn't" (11/10/2014)  . Pneumonia 1999  . PONV (postoperative nausea and vomiting)   .  Stroke [facility]) 10/2011; 06/2014   right hand numbness/notes 10/20/2011, pt not sure he had stroke in 2013; "right hand weaker and right face not quite right since" (11/10/2014)  . Type II diabetes mellitus ([place])   . Uncontrolled type II diabetes with peripheral autonomic neuropathy ([place]) 05/09/2015   Past Surgical History:  Procedure Laterality Date  . CARDIAC CATHETERIZATION N/A 05/02/2015   Procedure: Left Heart Cath and Coronary Angiography;  Surgeon: Lorretta Harp, MD;  Location: [facility];  Service: Cardiovascular;  Laterality: N/A;  . CARDIAC CATHETERIZATION N/A 05/04/2015   Procedure: Left Heart Cath and Coronary Angiography;  Surgeon: Wellington Hampshire, MD;  Location: [facility];  Service: Cardiovascular;  Laterality: N/A;  . CARPAL TUNNEL RELEASE Bilateral ~ 2003-2004  . CHOLECYSTECTOMY OPEN  1970's?  Marland Kitchen GASTROPLASTY    . JOINT REPLACEMENT    . LEFT HEART CATHETERIZATION WITH CORONARY ANGIOGRAM N/A 10/24/2011   Procedure: LEFT HEART CATHETERIZATION WITH CORONARY ANGIOGRAM;  Surgeon: Candee Furbish, MD;  Location: [facility] CATH LAB;  Service: Cardiovascular;  Laterality: N/A;  right radial artery approach  . TOTAL KNEE ARTHROPLASTY Right 1990's    Allergies  Allergen Reactions  . Ace Inhibitors Swelling    Pt tolerates lisinopril  . Lipitor [Atorvastatin Calcium] Swelling  . Metformin And Related Swelling  . Morphine And Related Other (See Comments)    Sweating, feels like is "in rocky boat."  . Robaxin [Methocarbamol] Other (See Comments)    Feels like he is shaky    Outpatient Encounter Medications as of 12/16/2017  Medication Sig  . acetaminophen (TYLENOL) 500 MG tablet Take 1,000 mg by mouth 3 (three) times daily.  Marland Kitchen aluminum-magnesium hydroxide-simethicone (MAALOX) 628-315-17 MG/5ML SUSP Take 30 mLs by mouth every 6 (six) hours as needed (indigestion).   Marland Kitchen amLODipine (NORVASC) 5 MG tablet Take 5 mg by mouth daily.  . Cholecalciferol 1000 units tablet Take 1,000 Units by  mouth daily.  . clindamycin (CLEOCIN) 300 MG capsule Take 300 mg by mouth 3 (three) times daily.  . diclofenac sodium (VOLTAREN) 1 % GEL Apply topically 3 (three) times daily. Apply to left shoulder topically three times daily   . doxycycline (DORYX) 100 MG EC tablet Take 100 mg by mouth 2 (two) times daily.  Marland Kitchen escitalopram (LEXAPRO) 10 MG tablet Take 10 mg by mouth daily.   Marland Kitchen ezetimibe (ZETIA) 10 MG tablet Take 10 mg by mouth daily.  . folic acid (FOLVITE) 1 MG tablet Take 1 mg by mouth daily.  Marland Kitchen gabapentin (NEURONTIN) 100 MG capsule Take 200 mg by mouth 2 (two) times daily.  Marland Kitchen guaiFENesin (MUCINEX) 600 MG 12 hr tablet Take 600 mg by mouth 2 (two) times daily.  . hydrOXYzine (ATARAX/VISTARIL) 25 MG tablet Take 25 mg by mouth 3 (three) times  daily.   . Insulin Glargine (TOUJEO SOLOSTAR) 300 UNIT/ML SOPN Inject 50 Units into the skin at bedtime.   . insulin lispro (HUMALOG) 100 UNIT/ML injection Inject 15 Units into the skin 3 (three) times daily with meals.   Marland Kitchen ipratropium-albuterol (DUONEB) 0.5-2.5 (3) MG/3ML SOLN Take 3 mLs by nebulization every 6 (six) hours as needed (sob).   Marland Kitchen LACTOBACILLUS PO Take 1 tablet by mouth daily. X 30 days  . linagliptin (TRADJENTA) 5 MG TABS tablet Take 5 mg by mouth daily.  . methotrexate (RHEUMATREX) 10 MG tablet Take 10 mg by mouth every Wednesday. Caution: Chemotherapy. Protect from light.  . metoprolol tartrate (LOPRESSOR) 25 MG tablet Take 12.5 mg by mouth 2 (two) times daily.   . Multiple Vitamins-Minerals (DECUBI-VITE) CAPS Take 1 capsule by mouth daily at 3 pm.  . nitroGLYCERIN (NITROSTAT) 0.4 MG SL tablet Place 0.4 mg under the tongue every 5 (five) minutes as needed for chest pain.  Marland Kitchen omeprazole (PRILOSEC) 20 MG capsule Take 20 mg by mouth daily.  . ondansetron (ZOFRAN) 4 MG tablet Take 4 mg by mouth every 6 (six) hours as needed for nausea or vomiting.  . OXYGEN Inhale into the lungs. O2 at 2l/min via nasal canula as needed for shortness of breath  .  potassium chloride (K-DUR) 10 MEQ tablet Take 10 mEq by mouth 4 (four) times daily.   . predniSONE (DELTASONE) 10 MG tablet Take 10 mg by mouth daily.  . rivaroxaban (XARELTO) 10 MG TABS tablet Take 10 mg by mouth daily.  Marland Kitchen rOPINIRole (REQUIP) 0.5 MG tablet Take 0.5 mg by mouth at bedtime. 2100  . sennosides-docusate sodium (SENOKOT-S) 8.6-50 MG tablet Take 2 tablets by mouth at bedtime. 2100  . torsemide (DEMADEX) 20 MG tablet Take 20 mg by mouth daily.   . traMADol (ULTRAM) 50 MG tablet Take 2 tablets (100 mg total) by mouth 3 (three) times daily. Hold if Lethargic or confused  . traZODone (DESYREL) 50 MG tablet Take 50 mg by mouth at bedtime.   . triamcinolone cream (KENALOG) 0.1 % Apply 1 application topically 2 (two) times daily.  . [DISCONTINUED] Amino Acids-Protein Hydrolys (FEEDING SUPPLEMENT, PRO-STAT SUGAR FREE 64,) LIQD Take 30 mLs by mouth 2 (two) times daily. (Patient not taking: Reported on 12/16/2017)  . [DISCONTINUED] cephALEXin (KEFLEX) 500 MG capsule Take 500 mg by mouth 3 (three) times daily.  . [DISCONTINUED] gabapentin (NEURONTIN) 100 MG capsule Take 1 capsule (100 mg total) by mouth 2 (two) times daily. (Patient not taking: Reported on 12/16/2017)  . [DISCONTINUED] warfarin (COUMADIN) 5 MG tablet Take 1 tablet (5 mg total) by mouth daily at 6 PM. (Patient not taking: Reported on 12/16/2017)   No facility-administered encounter medications on file as of 12/16/2017.     Review of Systems  Musculoskeletal: Positive for arthralgias.  Skin: Positive for rash and wound.  Neurological: Positive for weakness.  All other systems reviewed and are negative.   Immunization History  Administered Date(s) Administered  . Influenza Split 10/12/2011, 10/13/2011  . Influenza,inj,Quad PF,6+ Mos 05/06/2015  . Influenza-Unspecified 06/10/2014, 07/05/2017  . PPD Test 10/13/2015, 10/27/2015, 05/25/2016   Pertinent  Health Maintenance Due  Topic Date Due  . FOOT EXAM  01/20/2018 (Originally  10/30/2017)  . COLONOSCOPY  01/20/2018 (Originally 11/12/1997)  . OPHTHALMOLOGY EXAM  07/16/2018 (Originally 05/17/2017)  . URINE MICROALBUMIN  03/28/2018  . INFLUENZA VACCINE  04/10/2018  . HEMOGLOBIN A1C  04/24/2018  . PNA vac Low Risk Adult  Discontinued   Fall  Risk  03/28/2017 10/24/2015 09/28/2015 09/22/2015 07/15/2015  Falls in the past year? Yes Yes Yes Yes Yes  Number falls in past yr: 2 or more 2 or more 2 or more 2 or more 2 or more  Injury with Fall? - Yes Yes Yes Yes  Risk Factor Category  - High Fall Risk High Fall Risk High Fall Risk High Fall Risk  Risk for fall due to : - History of fall(s);Impaired balance/gait;Impaired mobility History of fall(s);Impaired balance/gait;Impaired mobility Impaired mobility;Impaired balance/gait Impaired balance/gait;Impaired mobility  Follow up - Education provided;Falls prevention discussed Education provided;Falls prevention discussed Education provided;Falls prevention discussed Education provided;Falls prevention discussed   Functional Status Survey:    Vitals:   12/16/17 1001  BP: (!) 150/82  Pulse: 89  Temp: (!) 96.4 F (35.8 C)  TempSrc: Tympanic  SpO2: 97%  Weight: (!) 348 lb 3.2 oz (157.9 kg)  Height: 6\' 2"  (1.88 m)   Body mass index is 44.71 kg/m. Physical Exam  Constitutional: He is oriented to person, place, and time. He appears well-developed.  Frail appearing in NAD, lying in bed; (+) body odor  HENT:  Mouth/Throat: Oropharynx is clear and moist.  MMM; no oral thrush  Eyes: Pupils are equal, round, and reactive to light. No scleral icterus.  Neck: Neck supple. Carotid bruit is not present. No thyromegaly present.  Cardiovascular: Normal rate, regular rhythm and intact distal pulses. Exam reveals no gallop and no friction rub.  Murmur (1/6 SEM) heard. +1 pitting LE edema b/l. No calf TTP  Pulmonary/Chest: Effort normal and breath sounds normal. He has no wheezes. He has no rales. He exhibits no tenderness.  Abdominal:  Soft. Normal appearance and bowel sounds are normal. He exhibits no distension, no abdominal bruit, no pulsatile midline mass and no mass. There is no hepatomegaly. There is no tenderness. There is no rigidity, no rebound and no guarding. No hernia.  Genitourinary:  Genitourinary Comments: Foley DTG clear yellow urine  Musculoskeletal: He exhibits edema.  Lymphadenopathy:    He has no cervical adenopathy.  Neurological: He is alert and oriented to person, place, and time.  Skin: Skin is warm and dry. Rash noted.     Psychiatric: He has a normal mood and affect. His behavior is normal. Judgment and thought content normal.    Labs reviewed: Recent Labs    05/29/17 1417  09/03/17 1216 09/04/17 0940 09/05/17 0636 09/08/17 0443 10/25/17  NA  --    < >  --  134* 138 134* 136*  K  --    < > 6.0* 4.2 3.8 3.4* 4.7  CL  --    < >  --  99* 101 96*  --   CO2  --    < >  --  22 27 28   --   GLUCOSE  --    < >  --  283* 137* 190*  --   BUN  --    < >  --  19 13 15 14   CREATININE  --    < >  --  1.03 0.94 1.26* 0.9  CALCIUM  --    < >  --  8.2* 8.8* 8.3*  --   MG 1.8  --  1.6*  --  1.7  --   --    < > = values in this interval not displayed.   Recent Labs    01/29/17 08/09/17 09/08/17 0443  AST 9* 11* 13*  ALT 13  19 14  15*  ALKPHOS 90 92 70  BILITOT  --   --  0.3  PROT  --   --  5.9*  ALBUMIN  --   --  2.6*   Recent Labs    01/29/17  05/29/17 0432  08/09/17  09/05/17 0636 09/06/17 0734 09/08/17 0443  WBC 15.0   < > 8.9   < > 9.6   < > 12.3* 10.4 10.0  NEUTROABS 12  --  6.5  --  7  --   --   --   --   HGB 12.6*   < > 10.2*   < > 12.4*   < > 12.8* 11.5* 11.3*  HCT 38*   < > 30.6*   < > 37*   < > 38.9* 35.3* 34.6*  MCV  --    < > 90.0   < >  --    < > 91.3 91.7 91.5  PLT 255   < > 242   < > 203   < > 198 231 226   < > = values in this interval not displayed.   Lab Results  Component Value Date   TSH 2.954 09/15/2015   Lab Results  Component Value Date   HGBA1C 9.8  10/25/2017   Lab Results  Component Value Date   CHOL 190 05/07/2017   HDL 38 05/07/2017   LDLCALC 92 05/07/2017   TRIG 299 (A) 05/07/2017   CHOLHDL 5.4 04/30/2015    Significant Diagnostic Results in last 30 days:  No results found.  Assessment/Plan   ICD-10-CM   1. Cellulitis of back except buttock - with possible yeast component L03.312    right upper -->right axillary; failing to change as expected  2. High risk medication use Z79.899   3. Other acute pulmonary embolism without acute cor pulmonale (HCC) I26.99   4. Uncontrolled type II diabetes with peripheral autonomic neuropathy (HCC) E11.43    E11.65   5. Chronic indwelling Foley catheter Z96.0   6. Bullous pemphigus L10.9     Add antifungal - START DIFLUCAN 800MG  PO X 1 THEN 400MG  PO DAILY X 14 DAYS  Cont clindamycin  Cont other meds as ordered  Highly recommend therapeutic shower - pt refuses  PT/OT/ST as indicated  Consult facility Wound care   D/w with [facility] NP mx of cellulitis. May need ED if no improvement in next 48-72 hrs  Will follow  Labs/tests ordered: cbc w diff, bmp; check wound cx/s   Fredericka Bottcher S. Perlie Gold  [facility] [address] [place], [place] [address] [phone] Cell (Monday-Friday 8 AM - 5 PM) [phone] After 5 PM and follow prompts

## 2017-12-17 LAB — BASIC METABOLIC PANEL
BUN: 28 — AB (ref 4–21)
Creatinine: 1.1 (ref 0.6–1.3)
Glucose: 312
Potassium: 5.1 (ref 3.4–5.3)
SODIUM: 136 — AB (ref 137–147)

## 2017-12-17 LAB — CBC AND DIFFERENTIAL
HCT: 37 — AB (ref 41–53)
HEMOGLOBIN: 12.1 — AB (ref 13.5–17.5)
Neutrophils Absolute: 13
Platelets: 258 (ref 150–399)
WBC: 14.7

## 2017-12-24 LAB — CBC AND DIFFERENTIAL
HEMATOCRIT: 36 — AB (ref 41–53)
HEMOGLOBIN: 11.6 — AB (ref 13.5–17.5)
Neutrophils Absolute: 8
PLATELETS: 235 (ref 150–399)
WBC: 12.1

## 2017-12-24 LAB — BASIC METABOLIC PANEL
BUN: 20 (ref 4–21)
CREATININE: 1 (ref 0.6–1.3)
Glucose: 236
POTASSIUM: 4.3 (ref 3.4–5.3)
Sodium: 132 — AB (ref 137–147)

## 2018-01-12 ENCOUNTER — Encounter: Payer: Self-pay | Admitting: Internal Medicine

## 2018-01-13 ENCOUNTER — Other Ambulatory Visit: Payer: Self-pay

## 2018-01-13 DIAGNOSIS — E119 Type 2 diabetes mellitus without complications: Secondary | ICD-10-CM | POA: Diagnosis not present

## 2018-01-13 DIAGNOSIS — Z79899 Other long term (current) drug therapy: Secondary | ICD-10-CM | POA: Diagnosis not present

## 2018-01-13 DIAGNOSIS — E1100 Type 2 diabetes mellitus with hyperosmolarity without nonketotic hyperglycemic-hyperosmolar coma (NKHHC): Secondary | ICD-10-CM | POA: Diagnosis not present

## 2018-01-13 DIAGNOSIS — D649 Anemia, unspecified: Secondary | ICD-10-CM | POA: Diagnosis not present

## 2018-01-13 DIAGNOSIS — I10 Essential (primary) hypertension: Secondary | ICD-10-CM | POA: Diagnosis not present

## 2018-01-13 LAB — BASIC METABOLIC PANEL
BUN: 21 (ref 4–21)
Creatinine: 0.9 (ref 0.6–1.3)
GLUCOSE: 264
POTASSIUM: 4.6 (ref 3.4–5.3)
SODIUM: 136 — AB (ref 137–147)

## 2018-01-13 LAB — CBC AND DIFFERENTIAL
HEMATOCRIT: 34 — AB (ref 41–53)
HEMOGLOBIN: 11.1 — AB (ref 13.5–17.5)
Neutrophils Absolute: 6
Platelets: 263 (ref 150–399)
WBC: 9.4

## 2018-01-13 LAB — HEMOGLOBIN A1C: HEMOGLOBIN A1C: 9.7

## 2018-01-13 MED ORDER — TRAMADOL HCL 50 MG PO TABS: 100.0000 mg | ORAL_TABLET | Freq: Three times a day (TID) | ORAL | 0 refills | Status: DC

## 2018-01-13 NOTE — Telephone Encounter (Signed)
Rx faxed to [facility] (P) [phone], (F) [phone] 

## 2018-01-20 ENCOUNTER — Encounter (HOSPITAL_BASED_OUTPATIENT_CLINIC_OR_DEPARTMENT_OTHER): Payer: Medicare Other

## 2018-01-21 LAB — MICROALBUMIN, URINE: MICROALB UR: 14.3

## 2018-02-13 ENCOUNTER — Non-Acute Institutional Stay (SKILLED_NURSING_FACILITY): Payer: Medicare Other | Admitting: Internal Medicine

## 2018-02-13 ENCOUNTER — Encounter: Payer: Self-pay | Admitting: Internal Medicine

## 2018-02-13 DIAGNOSIS — E1165 Type 2 diabetes mellitus with hyperglycemia: Secondary | ICD-10-CM

## 2018-02-13 DIAGNOSIS — Z79899 Other long term (current) drug therapy: Secondary | ICD-10-CM

## 2018-02-13 DIAGNOSIS — Z9119 Patient's noncompliance with other medical treatment and regimen: Secondary | ICD-10-CM

## 2018-02-13 DIAGNOSIS — Z978 Presence of other specified devices: Secondary | ICD-10-CM

## 2018-02-13 DIAGNOSIS — L109 Pemphigus, unspecified: Secondary | ICD-10-CM

## 2018-02-13 DIAGNOSIS — IMO0002 Reserved for concepts with insufficient information to code with codable children: Secondary | ICD-10-CM

## 2018-02-13 DIAGNOSIS — E1143 Type 2 diabetes mellitus with diabetic autonomic (poly)neuropathy: Secondary | ICD-10-CM

## 2018-02-13 DIAGNOSIS — S31809S Unspecified open wound of unspecified buttock, sequela: Secondary | ICD-10-CM

## 2018-02-13 DIAGNOSIS — S91001D Unspecified open wound, right ankle, subsequent encounter: Secondary | ICD-10-CM

## 2018-02-13 DIAGNOSIS — Z8673 Personal history of transient ischemic attack (TIA), and cerebral infarction without residual deficits: Secondary | ICD-10-CM

## 2018-02-13 DIAGNOSIS — Z91199 Patient's noncompliance with other medical treatment and regimen due to unspecified reason: Secondary | ICD-10-CM

## 2018-02-13 DIAGNOSIS — Z960 Presence of urogenital implants: Secondary | ICD-10-CM

## 2018-02-13 DIAGNOSIS — S21209S Unspecified open wound of unspecified back wall of thorax without penetration into thoracic cavity, sequela: Secondary | ICD-10-CM

## 2018-02-13 NOTE — Progress Notes (Signed)
Location:  [facility] Room Number: [place] of Service:  SNF [id number]) Provider:  [place], Cunningham, DO  Patient Care Team: Gildardo Cranker, DO as PCP - [facility], [place] ([place])  Extended Emergency Contact Information Primary Emergency Contact: Whiting,[name] Address: [address]          [place], [place] [address] [name] of [place] Phone: [phone] Relation: Friend Secondary Emergency Contact: [place] of [place] Phone: [phone] Mobile Phone: [phone] Relation: Brother  Code Status:  DNR Goals of care: Advanced Directive information Advanced Directives 02/13/2018  Does Patient Have a Medical Advance Directive? Yes  Type of Advance Directive Out of facility DNR (pink MOST or yellow form)  Does patient want to make changes to medical advance directive? No - Patient declined  Copy of [place] in Chart? -  Would patient like information on creating a medical advance directive? -  Pre-existing out of facility DNR order (yellow form or pink MOST form) Pink MOST form placed in chart (order not valid for inpatient use);Yellow form placed in chart (order not valid for inpatient use)     Chief Complaint  Patient presents with  . Medical Management of Chronic Issues    [facility]    HPI:  Pt is a 72 y.o. male seen today for medical management of chronic diseases.  He is c/a right back wound but he has poor hygiene habits and makes poor dietary choices. He is noncompliant with diet and meds. He frequently refuses insulin. He is eating caramel candy at this time. CBGs 200-360s. No low BS reactions. He has several wounds and is followed by facility wound care provider  Dyslipidemia - stable on zetia 10 mg daily. lipitor caused swelling. LDL 92  DM - uncontrolled but improved A1c 9.8% (prev 10.3%). He gets Tuojeo 42 units  nightly and humalog 12 units with meals. 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 he has had steroid injections in the past. Pain controlled on ultram 100 mg three times daily with tylenol 1 gm three times daily; voltaren gel to left shoulder three times daily   Hypertension - stable on norvasc 5 mg daily; lopressor 12.5 mg twice daily   Chronic diastolic heart failure - stable on demadex 20 mg  Daily; ASA 81 mg daily; lopressor 12.5 mg twice daily; NTG SL 0.4 mg every 5 mins X 3 as needed  GERD - stable on prilosec 20 mg daily  Hypokalemia - stable on KCl 10 meq tabs four times daily. He is occasionally noncompliant with med  Hx CKD - stage 3. Cr 0.9   Hx PE - stable on xeralto   Past Medical History:  Diagnosis Date  . Arthritis    "hands, ankles, knees" (11/10/2014)  . Bladder spasms   . CAD (coronary artery disease)   .  CAD in native artery 09/15/2015  . CHF (congestive heart failure) ([place])   . Chronic indwelling Foley catheter   . Chronic lower back pain   . Depression   . Diabetic diarrhea ([place])   . Diabetic neuropathy, painful ([place])   . DVT (deep venous thrombosis) (HCC) 1980's   LLE  . GERD (gastroesophageal reflux disease)   . Hyperlipidemia   . Hypertension   . Neurogenic bladder   . Obese   . OSA (obstructive sleep apnea)    "they wanted me to wear a mask; I couldn't" (11/10/2014)  . Pneumonia 1999  . PONV (postoperative nausea and vomiting)   . Stroke [facility]) 10/2011;  06/2014   right hand numbness/notes 10/20/2011, pt not sure he had stroke in 2013; "right hand weaker and right face not quite right since" (11/10/2014)  . Type II diabetes mellitus ([place])   . Uncontrolled type II diabetes with peripheral autonomic neuropathy ([place]) 05/09/2015   Past Surgical History:  Procedure Laterality Date  . CARDIAC CATHETERIZATION N/A 05/02/2015   Procedure: Left Heart Cath and Coronary Angiography;  Surgeon: Lorretta Harp, MD;  Location: [facility] CV LAB;  Service: Cardiovascular;  Laterality: N/A;  . CARDIAC CATHETERIZATION N/A 05/04/2015   Procedure: Left Heart Cath and Coronary Angiography;  Surgeon: Wellington Hampshire, MD;  Location: [facility] CV LAB;  Service: Cardiovascular;  Laterality: N/A;  . CARPAL TUNNEL RELEASE Bilateral ~ 2003-2004  . CHOLECYSTECTOMY OPEN  1970's?  Marland Kitchen GASTROPLASTY    . JOINT REPLACEMENT    . LEFT HEART CATHETERIZATION WITH CORONARY ANGIOGRAM N/A 10/24/2011   Procedure: LEFT HEART CATHETERIZATION WITH CORONARY ANGIOGRAM;  Surgeon: Candee Furbish, MD;  Location: [facility] CATH LAB;  Service: Cardiovascular;  Laterality: N/A;  right radial artery approach  . TOTAL KNEE ARTHROPLASTY Right 1990's    Allergies  Allergen Reactions  . Ace Inhibitors Swelling    Pt tolerates lisinopril  . Lipitor [Atorvastatin Calcium] Swelling  . Metformin And Related Swelling  . Morphine And Related Other (See Comments)    Sweating, feels like is "in rocky boat."  . Robaxin [Methocarbamol] Other (See Comments)    Feels like he is shaky    Outpatient Encounter Medications as of 02/13/2018  Medication Sig  . acetaminophen (TYLENOL) 500 MG tablet Take 1,000 mg by mouth every 8 (eight) hours as needed.   Marland Kitchen aluminum-magnesium hydroxide-simethicone (MAALOX) [phone] MG/5ML SUSP Take 30 mLs by mouth every 6 (six) hours as needed (indigestion).   Marland Kitchen amLODipine (NORVASC) 5 MG tablet Take 5 mg by mouth daily.  . Cholecalciferol 1000 units tablet Take 1,000 Units by mouth  daily.  . diclofenac sodium (VOLTAREN) 1 % GEL Apply topically 3 (three) times daily. Apply to left shoulder topically three times daily   . ezetimibe (ZETIA) 10 MG tablet Take 10 mg by mouth daily.  . folic acid (FOLVITE) 1 MG tablet Take 1 mg by mouth daily.  Marland Kitchen gabapentin (NEURONTIN) 100 MG capsule Take 200 mg by mouth 2 (two) times daily.  Marland Kitchen guaiFENesin (MUCINEX) 600 MG 12 hr tablet Take 600 mg by mouth 2 (two) times daily.  . hydrOXYzine (ATARAX/VISTARIL) 25 MG tablet Take 25 mg by mouth 3 (three) times daily.   . Insulin Glargine (TOUJEO SOLOSTAR) 300 UNIT/ML SOPN Inject 80 Units into the skin at bedtime.   . insulin lispro (HUMALOG) 100 UNIT/ML injection Inject 28 Units into the skin 3 (three) times daily with meals. Also inject 5 units subcutaneously every 8 hours as needed for  CBG greater than 300  . ipratropium-albuterol (DUONEB) 0.5-2.5 (3) MG/3ML SOLN Take 3 mLs by nebulization every 6 (six) hours as needed (sob).   Marland Kitchen linagliptin (TRADJENTA) 5 MG TABS tablet Take 5 mg by mouth daily.  . methotrexate (RHEUMATREX) 10 MG tablet Take 10 mg by mouth every Wednesday. Caution: Chemotherapy. Protect from light.  . metoprolol tartrate (LOPRESSOR) 25 MG tablet Take 25 mg by mouth daily.   . Multiple Vitamin (MULTIVITAMIN) tablet Take 1 tablet by mouth daily.  . nitroGLYCERIN (NITROSTAT) 0.4 MG SL tablet Place 0.4 mg under the tongue every 5 (five) minutes as needed for chest pain.  Marland Kitchen omeprazole (PRILOSEC) 20 MG capsule Take 20 mg by mouth daily.  . ondansetron (ZOFRAN) 4 MG tablet Take 4 mg by mouth every 6 (six) hours as needed for nausea or vomiting.  . OXYGEN Inhale into the lungs. O2 at 2l/min via nasal canula as needed for shortness of breath  . potassium chloride (K-DUR) 10 MEQ tablet Take 10 mEq by mouth 4 (four) times daily.   . predniSONE (DELTASONE) 10 MG tablet Take 10 mg by mouth daily.  . rivaroxaban (XARELTO) 10 MG TABS tablet Take 10 mg by mouth daily.  Marland Kitchen rOPINIRole (REQUIP) 0.5  MG tablet Take 0.5 mg by mouth at bedtime. 2100  . sennosides-docusate sodium (SENOKOT-S) 8.6-50 MG tablet Take 2 tablets by mouth at bedtime. 2100  . torsemide (DEMADEX) 20 MG tablet Take 20 mg by mouth daily.   . traMADol (ULTRAM) 50 MG tablet Take 2 tablets (100 mg total) by mouth 3 (three) times daily. Hold if Lethargic or confused  . traZODone (DESYREL) 50 MG tablet Take 50 mg by mouth at bedtime.   . triamcinolone cream (KENALOG) 0.1 % Apply 1 application topically 2 (two) times daily.  . [DISCONTINUED] escitalopram (LEXAPRO) 10 MG tablet Take 10 mg by mouth daily.   . [DISCONTINUED] Multiple Vitamins-Minerals (DECUBI-VITE) CAPS Take 1 capsule by mouth daily at 3 pm.   No facility-administered encounter medications on file as of 02/13/2018.     Review of Systems  Musculoskeletal: Positive for arthralgias and gait problem.  Skin: Positive for rash and wound.  All other systems reviewed and are negative.   Immunization History  Administered Date(s) Administered  . Influenza Split 10/12/2011, 10/13/2011  . Influenza,inj,Quad PF,6+ Mos 05/06/2015  . Influenza-Unspecified 06/10/2014, 07/05/2017  . PPD Test 10/13/2015, 10/27/2015, 05/25/2016  . Pneumococcal-Unspecified 09/16/2017   Pertinent  Health Maintenance Due  Topic Date Due  . COLONOSCOPY  03/17/2018 (Originally 11/12/1997)  . OPHTHALMOLOGY EXAM  07/16/2018 (Originally 05/17/2017)  . INFLUENZA VACCINE  04/10/2018  . HEMOGLOBIN A1C  07/16/2018  . URINE MICROALBUMIN  01/22/2019  . FOOT EXAM  02/06/2019  . PNA vac Low Risk Adult  Discontinued   Fall Risk  03/28/2017 10/24/2015 09/28/2015 09/22/2015 07/15/2015  Falls in the past year? Yes Yes Yes Yes Yes  Number falls in past yr: 2 or more 2 or more 2 or more 2 or more 2 or more  Injury with Fall? - Yes Yes Yes Yes  Risk Factor Category  - High Fall Risk High Fall Risk High Fall Risk High Fall Risk  Risk for fall due to : - History of fall(s);Impaired balance/gait;Impaired mobility  History of fall(s);Impaired balance/gait;Impaired mobility Impaired mobility;Impaired balance/gait Impaired balance/gait;Impaired mobility  Follow up - Education provided;Falls prevention discussed Education provided;Falls prevention discussed Education provided;Falls prevention discussed Education provided;Falls prevention discussed   Functional Status Survey:    Vitals:   02/13/18  1059  BP: 125/75  Pulse: 75  Resp: 20  Temp: 98 F (36.7 C)  SpO2: 96%  Weight: (!) 348 lb 3.2 oz (157.9 kg)  Height: 6\' 2"  (1.88 m)   Body mass index is 44.71 kg/m. Physical Exam  Constitutional: He is oriented to person, place, and time. He appears well-developed and well-nourished.  Poor hygiene; morbidly obese in NAD, sitting up in bed  HENT:  Mouth/Throat: Oropharynx is clear and moist.  MMM; no oral thrush  Eyes: Pupils are equal, round, and reactive to light. No scleral icterus.  Neck: Neck supple. Carotid bruit is not present. No thyromegaly present.  Cardiovascular: Normal rate, regular rhythm and intact distal pulses. Exam reveals no gallop and no friction rub.  Murmur (1/6 SEM) heard. +1 pitting LE edema b/l. No calf TTP  Pulmonary/Chest: Effort normal and breath sounds normal. He has no wheezes. He has no rales. He exhibits no tenderness.  Abdominal: Soft. Bowel sounds are normal. He exhibits no distension, no abdominal bruit, no pulsatile midline mass and no mass. There is no hepatomegaly. There is no tenderness. There is no rebound and no guarding.  obese  Genitourinary:  Genitourinary Comments: Foley cath intact DTG clear yellow urine  Musculoskeletal: He exhibits edema.  Lymphadenopathy:    He has no cervical adenopathy.  Neurological: He is alert and oriented to person, place, and time. He has normal reflexes.  Skin: Skin is warm and dry. Rash (chronic venous stasis changes on LE b/l; eczematous rash on extremities) noted.  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 His behavior is normal. Judgment and thought content normal.    Labs reviewed: Recent Labs    05/29/17 1417  09/03/17 1216 09/04/17 0940 09/05/17 0636 09/08/17 0443  12/17/17 12/24/17 01/13/18  NA  --    < >  --  134* 138 134*   < > 136* 132* 136*  K  --    < > 6.0* 4.2 3.8 3.4*   < > 5.1 4.3 4.6  CL  --    < >  --  99* 101 96*  --   --   --   --   CO2  --    < >  --  22 27 28   --   --   --   --   GLUCOSE  --    < >  --  283* 137* 190*  --   --   --   --   BUN  --    < >  --  19 13 15    < > 28* 20 21  CREATININE  --    < >  --  1.03 0.94 1.26*   < > 1.1 1.0 0.9  CALCIUM  --    < >  --  8.2* 8.8* 8.3*  --   --   --   --   MG 1.8  --  1.6*  --  1.7  --   --   --   --   --    < > = values in this interval not displayed.   Recent Labs    08/09/17 09/08/17 0443  AST 11* 13*  ALT 14  15*  ALKPHOS 92 70  BILITOT  --  0.3  PROT  --  5.9*  ALBUMIN  --  2.6*   Recent Labs    09/05/17 0636 09/06/17 0734 09/08/17 0443  12/17/17 12/24/17 01/13/18  WBC 12.3* 10.4 10.0   < > 14.7 12.1 9.4  NEUTROABS  --   --   --    < > 13 8 6   HGB 12.8* 11.5* 11.3*   < > 12.1* 11.6* 11.1*  HCT 38.9* 35.3* 34.6*   < > 37* 36* 34*  MCV 91.3 91.7 91.5  --   --   --   --   PLT 198 231 226   < > 258 235 263   < > = values in this interval not displayed.   Lab Results  Component Value Date   TSH 2.954 09/15/2015   Lab Results  Component Value Date   HGBA1C 9.7 01/13/2018   Lab Results  Component Value Date   CHOL 190 05/07/2017   HDL 38 05/07/2017   LDLCALC 92 05/07/2017   TRIG 299 (A) 05/07/2017   CHOLHDL 5.4 04/30/2015    Significant Diagnostic Results in last 30 days:  No results found.  Assessment/Plan   ICD-10-CM   1. Uncontrolled  type II diabetes with peripheral autonomic neuropathy (HCC) E11.43    E11.65   2. Medically noncompliant Z91.19   3. Wound of right ankle, subsequent encounter S91.001D   4. Wound of buttock, unspecified laterality, sequela S31.809S   5. Wound of back, unspecified laterality, sequela S21.209S   6. Bullous pemphigus L10.9   7. H/O: CVA (cerebrovascular accident) Z2.73   8. Chronic indwelling Foley catheter Z96.0   9. High risk medication use Z79.899    Increase humalog 30 units qAC  Cont other meds as ordered  Foley cath care as indicated  Wound care as ordered  [facility] NP to follow  Will follow  Labs/tests ordered: none   Murrell Elizondo S. Perlie Gold  [facility] [address] [place], [place] [address] [phone] Cell (Monday-Friday 8 AM - 5 PM) [phone] After 5 PM and follow prompts

## 2018-04-21 ENCOUNTER — Encounter: Payer: Self-pay | Admitting: Internal Medicine

## 2018-04-21 ENCOUNTER — Non-Acute Institutional Stay (SKILLED_NURSING_FACILITY): Payer: Medicare Other

## 2018-04-21 ENCOUNTER — Non-Acute Institutional Stay (SKILLED_NURSING_FACILITY): Payer: Medicare Other | Admitting: Internal Medicine

## 2018-04-21 DIAGNOSIS — E1165 Type 2 diabetes mellitus with hyperglycemia: Secondary | ICD-10-CM

## 2018-04-21 DIAGNOSIS — IMO0002 Reserved for concepts with insufficient information to code with codable children: Secondary | ICD-10-CM

## 2018-04-21 DIAGNOSIS — S41001S Unspecified open wound of right shoulder, sequela: Secondary | ICD-10-CM

## 2018-04-21 DIAGNOSIS — S31819S Unspecified open wound of right buttock, sequela: Secondary | ICD-10-CM

## 2018-04-21 DIAGNOSIS — G894 Chronic pain syndrome: Secondary | ICD-10-CM | POA: Diagnosis not present

## 2018-04-21 DIAGNOSIS — Z978 Presence of other specified devices: Secondary | ICD-10-CM

## 2018-04-21 DIAGNOSIS — Z0000 Encounter for general adult medical examination without abnormal findings: Secondary | ICD-10-CM

## 2018-04-21 DIAGNOSIS — K5900 Constipation, unspecified: Secondary | ICD-10-CM | POA: Diagnosis not present

## 2018-04-21 DIAGNOSIS — E1143 Type 2 diabetes mellitus with diabetic autonomic (poly)neuropathy: Secondary | ICD-10-CM

## 2018-04-21 DIAGNOSIS — Z960 Presence of urogenital implants: Secondary | ICD-10-CM

## 2018-04-21 NOTE — Progress Notes (Signed)
Subjective:   [name] is a 72 y.o. male who presents for Medicare Annual/Subsequent preventive examination at [place]  Last AWV-03/28/2017     Objective:    Vitals: BP 130/75 (BP Location: Left Arm, Patient Position: Supine)   Pulse 76   Temp 98.1 F (36.7 C) (Oral)   Ht 6\' 1"  (1.854 m)   Wt (!) 348 lb (157.9 kg)   BMI 45.91 kg/m   Body mass index is 45.91 kg/m.  Advanced Directives 04/21/2018 04/21/2018 02/13/2018 12/16/2017 09/09/2017 09/05/2017 09/03/2017  Does Patient Have a Medical Advance Directive? Yes Yes Yes Yes Yes - Yes  Type of Advance Directive Out of facility DNR (pink MOST or yellow form) Out of facility DNR (pink MOST or yellow form) Out of facility DNR (pink MOST or yellow form) Out of facility DNR (pink MOST or yellow form) Out of facility DNR (pink MOST or yellow form) (No Data) Out of facility DNR (pink MOST or yellow form)  Does patient want to make changes to medical advance directive? No - Patient declined No - Patient declined No - Patient declined No - Patient declined No - Patient declined No - Patient declined -  Copy of [place] in [place]  Would patient like information on creating a medical advance directive? - - - - - - -  Pre-existing out of facility DNR order (yellow form or pink MOST form) Yellow form placed in chart (order not valid for inpatient use);Pink MOST form placed in chart (order not valid for inpatient use) Yellow form placed in chart (order not valid for inpatient use);Pink MOST form placed in chart (order not valid for inpatient use) Pink MOST form placed in chart (order not valid for inpatient use);Yellow form placed in chart (order not valid for inpatient use) Pink MOST form placed in chart (order not valid for inpatient use) Pink MOST form placed in chart (order not valid for inpatient use) - Pink MOST form placed in chart (order not valid for inpatient use)    Tobacco Social  History   Tobacco Use  Smoking Status Former Smoker  . Packs/day: 2.00  . Years: 20.00  . Pack years: 40.00  . Types: Cigarettes  . Last attempt to quit: 09/10/1976  . Years since quitting: 41.6  Smokeless Tobacco Never Used     Counseling given: Not Answered   Clinical Intake:  Pre-visit preparation completed: No  Pain : 0-10 Pain Score: 3  Pain Type: Chronic pain Pain Location: Hip Pain Orientation: Right, Left Pain Descriptors / Indicators: Sore(bed sore) Pain Onset: More than a month ago Pain Frequency: Intermittent     Nutritional Risks: None Diabetes: Yes CBG done?: No Did pt. bring in CBG monitor from home?: No  How often do you need to have someone help you when you read instructions, pamphlets, or other written materials from your doctor or pharmacy?: 2 - Rarely  Interpreter Needed?: No  Information entered by :: Tyson Dense, RN  Past Medical History:  Diagnosis Date  . Arthritis    "hands, ankles, knees" (11/10/2014)  . Bladder spasms   . CAD (coronary artery disease)   . CAD in native artery 09/15/2015  . CHF (congestive heart failure) ([place])   . Chronic indwelling Foley catheter   . Chronic lower back pain   . Depression   . Diabetic diarrhea ([place])   . Diabetic neuropathy, painful ([place])   . DVT (  deep venous thrombosis) ([facility]) 1980's   LLE  . GERD (gastroesophageal reflux disease)   . Hyperlipidemia   . Hypertension   . Neurogenic bladder   . Obese   . OSA (obstructive sleep apnea)    "they wanted me to wear a mask; I couldn't" (11/10/2014)  . Pneumonia 1999  . PONV (postoperative nausea and vomiting)   . Stroke [facility]) 10/2011; 06/2014   right hand numbness/notes 10/20/2011, pt not sure he had stroke in 2013; "right hand weaker and right face not quite right since" (11/10/2014)  . Type II diabetes mellitus ([place])   . Uncontrolled type II diabetes with peripheral autonomic neuropathy ([place]) 05/09/2015   Past Surgical History:  Procedure Laterality Date    . CARDIAC CATHETERIZATION N/A 05/02/2015   Procedure: Left Heart Cath and Coronary Angiography;  Surgeon: Lorretta Harp, MD;  Location: [place] CV LAB;  Service: Cardiovascular;  Laterality: N/A;  . CARDIAC CATHETERIZATION N/A 05/04/2015   Procedure: Left Heart Cath and Coronary Angiography;  Surgeon: Wellington Hampshire, MD;  Location: [place] [facility];  Service: Cardiovascular;  Laterality: N/A;  . CARPAL TUNNEL RELEASE Bilateral ~ 2003-2004  . CHOLECYSTECTOMY OPEN  1970's?  Marland Kitchen GASTROPLASTY    . JOINT REPLACEMENT    . LEFT HEART CATHETERIZATION WITH CORONARY ANGIOGRAM N/A 10/24/2011   Procedure: LEFT HEART CATHETERIZATION WITH CORONARY ANGIOGRAM;  Surgeon: Candee Furbish, MD;  Location: [facility] CATH LAB;  Service: Cardiovascular;  Laterality: N/A;  right radial artery approach  . TOTAL KNEE ARTHROPLASTY Right 1990's   Family History  Problem Relation Age of Onset  . Diabetes type I Father   . Hypertension Mother   . Cancer Brother         Testicular Cancer  . Cancer Brother        Leukemia   Social History   Socioeconomic History  . Marital status: Widowed    Spouse name: Not on file  . Number of children: Not on file  . Years of education: Not on file  . Highest education level: Not on file  Occupational History  . Occupation: Retired  Scientific laboratory technician  . Financial resource strain: Not hard at all  . Food insecurity:    Worry: Never true    Inability: Never true  . Transportation needs:    Medical: No    Non-medical: No  Tobacco Use  . Smoking status: Former Smoker    Packs/day: 2.00    Years: 20.00    Pack years: 40.00    Types: Cigarettes    Last attempt to quit: 09/10/1976    Years since quitting: 41.6  . Smokeless tobacco: Never Used  Substance and Sexual Activity  . Alcohol use: No    Alcohol/week: 0.0 standard drinks    Comment: FORMER ALCOHOLIC; "sober since 46/27/0350"  . Drug use: No  . Sexual activity: Never  Lifestyle  . Physical activity:    Days per week:  0 days    Minutes per session: 0 min  . Stress: Only a little  Relationships  . Social connections:    Talks on phone: Once a week    Gets together: Once a week    Attends religious service: Never    Active member of club or organization: No    Attends meetings of clubs or organizations: Never    Relationship status: Widowed  Other Topics Concern  . Not on file  Social History Narrative  . Not on file    Outpatient Encounter  Medications as of 04/21/2018  Medication Sig  . acetaminophen (TYLENOL) 500 MG tablet Take 1,000 mg by mouth every 8 (eight) hours as needed.   Marland Kitchen aluminum-magnesium hydroxide-simethicone (MAALOX) 093-818-29 MG/5ML SUSP Take 30 mLs by mouth every 6 (six) hours as needed (indigestion).   Marland Kitchen amLODipine (NORVASC) 5 MG tablet Take 5 mg by mouth daily.  . bisacodyl (DULCOLAX) 10 MG suppository Place 10 mg rectally daily as needed for moderate constipation.  . Cholecalciferol 1000 units tablet Take 1,000 Units by mouth daily.  . diclofenac sodium (VOLTAREN) 1 % GEL Apply topically 3 (three) times daily. Apply to left shoulder topically three times daily   . ezetimibe (ZETIA) 10 MG tablet Take 10 mg by mouth daily.  . folic acid (FOLVITE) 1 MG tablet Take 1 mg by mouth daily.  Marland Kitchen gabapentin (NEURONTIN) 100 MG capsule Take 200 mg by mouth 2 (two) times daily.  Marland Kitchen guaiFENesin (MUCINEX) 600 MG 12 hr tablet Take 600 mg by mouth 2 (two) times daily as needed for cough.   . hydrOXYzine (ATARAX/VISTARIL) 25 MG tablet Take 25 mg by mouth 3 (three) times daily.   . Insulin Glargine (TOUJEO SOLOSTAR) 300 UNIT/ML SOPN Inject 85 Units into the skin at bedtime.   . insulin lispro (HUMALOG) 100 UNIT/ML injection Inject 25 Units into the skin 3 (three) times daily with meals. Also inject 5 units subcutaneously every 8 hours as needed for CBG greater than 300  . ipratropium-albuterol (DUONEB) 0.5-2.5 (3) MG/3ML SOLN Take 3 mLs by nebulization every 6 (six) hours as needed (sob).   Marland Kitchen  linagliptin (TRADJENTA) 5 MG TABS tablet Take 5 mg by mouth daily.  . methotrexate (RHEUMATREX) 10 MG tablet Take 10 mg by mouth every Wednesday. Caution: Chemotherapy. Protect from light.  . metoprolol tartrate (LOPRESSOR) 25 MG tablet Take 25 mg by mouth daily.   . Multiple Vitamin (MULTIVITAMIN) tablet Take 1 tablet by mouth daily.  . nitroGLYCERIN (NITROSTAT) 0.4 MG SL tablet Place 0.4 mg under the tongue every 5 (five) minutes as needed for chest pain.  Marland Kitchen omeprazole (PRILOSEC) 20 MG capsule Take 20 mg by mouth daily.  . ondansetron (ZOFRAN) 4 MG tablet Take 4 mg by mouth every 6 (six) hours as needed for nausea or vomiting.  . OXYGEN Inhale into the lungs. O2 at 2l/min via nasal canula as needed for shortness of breath  . polyethylene glycol (MIRALAX / GLYCOLAX) packet Take 17 g by mouth daily as needed for mild constipation or moderate constipation.  . potassium chloride (K-DUR) 10 MEQ tablet Take 10 mEq by mouth 4 (four) times daily.   . predniSONE (DELTASONE) 10 MG tablet Take 10 mg by mouth daily.  . rivaroxaban (XARELTO) 10 MG TABS tablet Take 10 mg by mouth daily.  Marland Kitchen rOPINIRole (REQUIP) 0.5 MG tablet Take 0.5 mg by mouth at bedtime. 2100  . sennosides-docusate sodium (SENOKOT-S) 8.6-50 MG tablet Take 2 tablets by mouth at bedtime. 2100  . simethicone (MYLICON) 937 MG chewable tablet Chew 125 mg by mouth every 6 (six) hours as needed for flatulence.  . torsemide (DEMADEX) 20 MG tablet Take 20 mg by mouth daily.   . traMADol (ULTRAM) 50 MG tablet Take 2 tablets (100 mg total) by mouth 3 (three) times daily. Hold if Lethargic or confused  . traZODone (DESYREL) 100 MG tablet Take 100 mg by mouth at bedtime.   . triamcinolone cream (KENALOG) 0.1 % Apply 1 application topically 2 (two) times daily.   No facility-administered encounter medications  on file as of 04/21/2018.     Activities of Daily Living In your present state of health, do you have any difficulty performing the following  activities: 04/21/2018 09/05/2017  Hearing? N N  Vision? N N  Difficulty concentrating or making decisions? N N  Walking or climbing stairs? Y Y  Dressing or bathing? Y Y  Doing errands, shopping? [name]  Preparing Food and eating ? Y -  Using the Toilet? Y -  In the past six months, have you accidently leaked urine? Y -  Comment foley catheter -  Do you have problems with loss of bowel control? Y -  Managing your Medications? Y -  Managing your Finances? Y -  Housekeeping or managing your Housekeeping? Y -  Some recent data might be hidden    Patient Care Team: Gildardo Cranker, DO as PCP - [facility], [place] ([place])   Assessment:   This is a routine wellness examination for [name].  Exercise Activities and Dietary recommendations Current Exercise Habits: The patient does not participate in regular exercise at present, Exercise limited by: orthopedic condition(s)  Goals   None     Fall Risk Fall Risk  04/21/2018 03/28/2017 10/24/2015 09/28/2015 09/22/2015  Falls in the past year? No Yes Yes Yes Yes  Number falls in past yr: - 2 or more 2 or more 2 or more 2 or more  Injury with Fall? - - Yes Yes Yes  Risk Factor Category  - - High Fall Risk High Fall Risk High Fall Risk  Risk for fall due to : - - History of fall(s);Impaired balance/gait;Impaired mobility History of fall(s);Impaired balance/gait;Impaired mobility Impaired mobility;Impaired balance/gait  Follow up - - Education provided;Falls prevention discussed Education provided;Falls prevention discussed Education provided;Falls prevention discussed   Is the patient's home free of loose throw rugs in walkways, pet beds, electrical cords, etc?   yes      Grab bars in the bathroom? yes      Handrails on the stairs?   yes      Adequate lighting?   yes  Depression Screen PHQ 2/9 Scores 04/21/2018 03/28/2017 10/24/2015 09/28/2015  PHQ - 2 Score 1 0 1 0  PHQ- 9 Score - - - -     Cognitive Function     6CIT Screen 04/21/2018 03/28/2017  What Year? 0 points 0 points  What month? 0 points 0 points  What time? 0 points 0 points  Count back from 20 0 points 0 points  Months in reverse 0 points 2 points  Repeat phrase 2 points 2 points  Total Score 2 4    Immunization History  Administered Date(s) Administered  . Influenza Split 10/12/2011, 10/13/2011  . Influenza,inj,Quad PF,6+ Mos 05/06/2015  . Influenza-Unspecified 06/10/2014, 07/05/2017  . PPD Test 10/13/2015, 10/27/2015, 05/25/2016  . Pneumococcal-Unspecified 09/16/2017    Qualifies for Shingles Vaccine? Not in past records  Screening Tests Health Maintenance  Topic Date Due  . COLONOSCOPY  05/22/2018 (Originally 11/12/1997)  . Hepatitis C Screening  05/22/2018 (Originally 12-Apr-1946)  . INFLUENZA VACCINE  07/22/2018 (Originally 04/10/2018)  . HEMOGLOBIN A1C  07/16/2018  . URINE MICROALBUMIN  01/22/2019  . FOOT EXAM  02/06/2019  . OPHTHALMOLOGY EXAM  04/16/2019  . TETANUS/TDAP  Discontinued  . PNA vac Low Risk Adult  Discontinued   Cancer Screenings: Lung: Low Dose CT Chest recommended if Age 38-80 years, 30 pack-year currently smoking OR have quit w/in 15years. Patient does not qualify. Colorectal:  due, declined  Additional Screenings: Hepatitis C Screening:declined      Plan:   I have personally reviewed and addressed the Medicare Annual Wellness questionnaire and have noted the following in the patient's chart:  A. Medical and social history B. Use of alcohol, tobacco or illicit drugs  C. Current medications and supplements D. Functional ability and status E.  Nutritional status F.  Physical activity G. Advance directives H. List of other physicians I.  Hospitalizations, surgeries, and ER visits in previous 12 months J.  [place] to include hearing, vision, cognitive, depression L. Referrals and appointments - none  In addition, I have reviewed and discussed with  patient certain preventive protocols, quality metrics, and best practice recommendations. A written personalized care plan for preventive services as well as general preventive health recommendations were provided to patient.  See attached scanned questionnaire for additional information.   Signed,   Tyson Dense, RN Nurse Health Advisor  Patient Concerns: None

## 2018-04-21 NOTE — Progress Notes (Signed)
Patient ID: [name], male   DOB: 08/13/1946, 72 y.o.   MRN: [id number]   Location:  [facility] Room Number: [place] of Service:  SNF [phone]) Provider:  [place], [place], DO  Patient Care Team: Brian Cranker, DO as PCP - [facility] (Internal [facility]) [facility], [place] ([place])  Extended Emergency Contact Information Primary Emergency Contact: Whiting,[name] Address: [address]          [place], [place] [address] [name] of [place] Phone: [phone] Relation: Friend Secondary Emergency Contact: [name] of [place] Phone: [phone] Mobile Phone: [phone] Relation: Brother  Code Status:  DNR Goals of care: Advanced Directive information Advanced Directives 04/21/2018  Does Patient Have a Medical Advance Directive? Yes  Type of Advance Directive Out of facility DNR (pink MOST or yellow form)  Does patient want to make changes to medical advance directive? No - Patient declined  Copy of [place] in Chart? -  Would patient like information on creating a medical advance directive? -  Pre-existing out of facility DNR order (yellow form or pink MOST form) Yellow form placed in chart (order not valid for inpatient use);Pink MOST form placed in chart (order not valid for inpatient use)     Chief Complaint  Patient presents with  . Medical Management of Chronic Issues    [facility]    HPI:  Pt is a 72 y.o. male seen today for medical management of chronic diseases.  He c/o chronic pain but overall controlled. He has constipation that was not relieved with suppository 3 days ago. No N/V. No bloody stools. Appetite ok and sleeps well. He is noncompliant with meds and medical recommendations. CBGs fluctuate heavily 110-400s. No low BS reactions. He is followed by wound care. No other concerns.   Past Medical History:  Diagnosis Date  . Arthritis    "hands, ankles, knees" (11/10/2014)  . Bladder spasms   . CAD (coronary artery disease)   . CAD in native artery 09/15/2015  . CHF (congestive heart failure) ([place])   . Chronic indwelling Foley catheter   . Chronic lower back pain   . Depression   . Diabetic diarrhea ([place])   . Diabetic neuropathy, painful ([place])   . DVT (deep venous thrombosis) (HCC) 1980's   LLE  . GERD (gastroesophageal reflux disease)   . Hyperlipidemia   . Hypertension   . Neurogenic bladder   . Obese   . OSA (obstructive sleep apnea)    "they wanted me to wear a mask; I couldn't" (11/10/2014)  . Pneumonia 1999  . PONV (postoperative nausea and vomiting)   . Stroke [facility]) 10/2011; 06/2014   right hand numbness/notes 10/20/2011, pt not sure he had stroke in 2013; "right hand weaker and right face not quite right since" (11/10/2014)  . Type II diabetes mellitus ([place])   . 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 CARDIAC CATHETERIZATION N/A 05/02/2015   Procedure: Left Heart Cath and Coronary Angiography;  Surgeon: Lorretta Harp, MD;  Location: [facility];  Service: Cardiovascular;  Laterality: N/A;  . CARDIAC CATHETERIZATION N/A 05/04/2015   Procedure: Left Heart Cath and Coronary Angiography;  Surgeon: Wellington Hampshire, MD;  Location: [facility];  Service: Cardiovascular;  Laterality: N/A;  . CARPAL TUNNEL RELEASE Bilateral ~ 2003-2004  . CHOLECYSTECTOMY OPEN  1970's?  Marland Kitchen GASTROPLASTY    . JOINT REPLACEMENT    . LEFT HEART CATHETERIZATION WITH CORONARY ANGIOGRAM N/A 10/24/2011   Procedure: LEFT HEART CATHETERIZATION WITH CORONARY ANGIOGRAM;  Surgeon: Candee Furbish, MD;  Location: [facility] CATH LAB;  Service: Cardiovascular;  Laterality: N/A;  right radial artery approach  . TOTAL KNEE ARTHROPLASTY Right 1990's    Allergies  Allergen Reactions  . Ace Inhibitors Swelling    Pt tolerates lisinopril  . Lipitor  [Atorvastatin Calcium] Swelling  . Metformin And Related Swelling  . Morphine And Related Other (See Comments)    Sweating, feels like is "in rocky boat."  . Robaxin [Methocarbamol] Other (See Comments)    Feels like he is shaky    Outpatient Encounter Medications as of 04/21/2018  Medication Sig  . acetaminophen (TYLENOL) 500 MG tablet Take 1,000 mg by mouth every 8 (eight) hours as needed.   Marland Kitchen aluminum-magnesium hydroxide-simethicone (MAALOX) [phone] MG/5ML SUSP Take 30 mLs by mouth every 6 (six) hours as needed (indigestion).   Marland Kitchen amLODipine (NORVASC) 5 MG tablet Take 5 mg by mouth daily.  . bisacodyl (DULCOLAX) 10 MG suppository Place 10 mg rectally daily as needed for moderate constipation.  . Cholecalciferol 1000 units tablet Take 1,000 Units by mouth daily.  . diclofenac sodium (VOLTAREN) 1 % GEL Apply topically 3 (three) times daily. Apply to left shoulder topically three times daily   . ezetimibe (ZETIA) 10 MG tablet Take 10 mg by mouth daily.  . folic acid (FOLVITE) 1 MG tablet Take 1 mg by mouth daily.  Marland Kitchen gabapentin (NEURONTIN) 100 MG capsule Take 200 mg by mouth 2 (two) times daily.  Marland Kitchen guaiFENesin (MUCINEX) 600 MG 12 hr tablet Take 600 mg by mouth 2 (two) times daily as needed for cough.   . hydrOXYzine (ATARAX/VISTARIL) 25 MG tablet Take 25 mg by mouth 3 (three) times daily.   . Insulin Glargine (TOUJEO SOLOSTAR) 300 UNIT/ML SOPN Inject 85 Units into the skin at bedtime.   . insulin lispro (HUMALOG) 100 UNIT/ML injection Inject 25 Units into the skin 3 (three) times daily with meals. Also inject 5 units subcutaneously every 8 hours as needed for CBG greater than 300  . ipratropium-albuterol (DUONEB) 0.5-2.5 (3) MG/3ML SOLN Take 3 mLs by nebulization every 6 (six) hours as needed (sob).   Marland Kitchen linagliptin (TRADJENTA) 5 MG TABS tablet Take 5 mg by mouth daily.  . methotrexate (RHEUMATREX) 10 MG tablet Take 10 mg by mouth every Wednesday. Caution: Chemotherapy. Protect from light.    . metoprolol tartrate (LOPRESSOR) 25 MG tablet Take 25 mg by mouth daily.   . Multiple Vitamin (MULTIVITAMIN) tablet Take 1 tablet by mouth daily.  . nitroGLYCERIN (NITROSTAT) 0.4 MG SL tablet Place 0.4 mg under the tongue every 5 (five) minutes as needed for chest pain.  Marland Kitchen omeprazole (PRILOSEC) 20 MG capsule Take 20 mg by mouth daily.  . ondansetron (ZOFRAN) 4 MG tablet Take 4 mg by mouth every 6 (six) hours as needed for nausea or vomiting.  . OXYGEN Inhale into the lungs. O2 at 2l/min via nasal canula as needed for shortness of breath  . polyethylene glycol (MIRALAX / GLYCOLAX) packet Take 17 g by mouth daily as needed for mild constipation or moderate constipation.  . potassium chloride (K-DUR) 10 MEQ tablet Take 10 mEq by mouth 4 (four) times daily.   . predniSONE (DELTASONE) 10 MG tablet Take 10 mg by mouth daily.  . rivaroxaban (XARELTO) 10 MG TABS tablet Take 10 mg  by mouth daily.  Marland Kitchen rOPINIRole (REQUIP) 0.5 MG tablet Take 0.5 mg by mouth at bedtime. 2100  . sennosides-docusate sodium (SENOKOT-S) 8.6-50 MG tablet Take 2 tablets by mouth at bedtime. 2100  . simethicone (MYLICON) 937 MG chewable tablet Chew 125 mg by mouth every 6 (six) hours as needed for flatulence.  . torsemide (DEMADEX) 20 MG tablet Take 20 mg by mouth daily.   . traMADol (ULTRAM) 50 MG tablet Take 2 tablets (100 mg total) by mouth 3 (three) times daily. Hold if Lethargic or confused  . traZODone (DESYREL) 100 MG tablet Take 100 mg by mouth at bedtime.   . triamcinolone cream (KENALOG) 0.1 % Apply 1 application topically 2 (two) times daily.   No facility-administered encounter medications on file as of 04/21/2018.     Review of Systems  Gastrointestinal: Positive for constipation.  Musculoskeletal: Positive for arthralgias and gait problem.  Skin: Positive for wound.  All other systems reviewed and are negative.   Immunization History  Administered Date(s) Administered  . Influenza Split 10/12/2011,  10/13/2011  . Influenza,inj,Quad PF,6+ Mos 05/06/2015  . Influenza-Unspecified 06/10/2014, 07/05/2017  . PPD Test 10/13/2015, 10/27/2015, 05/25/2016  . Pneumococcal-Unspecified 09/16/2017   Pertinent  Health Maintenance Due  Topic Date Due  . COLONOSCOPY  05/22/2018 (Originally 11/12/1997)  . INFLUENZA VACCINE  07/22/2018 (Originally 04/10/2018)  . HEMOGLOBIN A1C  07/16/2018  . URINE MICROALBUMIN  01/22/2019  . FOOT EXAM  02/06/2019  . OPHTHALMOLOGY EXAM  04/16/2019  . PNA vac Low Risk Adult  Discontinued   Fall Risk  03/28/2017 10/24/2015 09/28/2015 09/22/2015 07/15/2015  Falls in the past year? Yes Yes Yes Yes Yes  Number falls in past yr: 2 or more 2 or more 2 or more 2 or more 2 or more  Injury with Fall? - Yes Yes Yes Yes  Risk Factor Category  - High Fall Risk High Fall Risk High Fall Risk High Fall Risk  Risk for fall due to : - History of fall(s);Impaired balance/gait;Impaired mobility History of fall(s);Impaired balance/gait;Impaired mobility Impaired mobility;Impaired balance/gait Impaired balance/gait;Impaired mobility  Follow up - Education provided;Falls prevention discussed Education provided;Falls prevention discussed Education provided;Falls prevention discussed Education provided;Falls prevention discussed   Functional Status Survey:    Vitals:   04/21/18 1030  BP: 104/82  Pulse: 81  Resp: 18  Temp: (!) 97.3 F (36.3 C)  SpO2: 98%  Weight: (!) 348 lb 3.2 oz (157.9 kg)  Height: 6\' 1"  (1.854 m)   Body mass index is 45.94 kg/m. Physical Exam  Constitutional: He is oriented to person, place, and time. He appears well-developed and well-nourished.  Lying in bed in NAD, [place] O2 intact  HENT:  Mouth/Throat: Oropharynx is clear and moist.  MMM; no oral thrush  Eyes: Pupils are equal, round, and reactive to light. No scleral icterus.  Neck: Neck supple. Carotid bruit is not present. No thyromegaly present.  Cardiovascular: Normal rate, regular rhythm and intact distal  pulses. Exam reveals no gallop and no friction rub.  Murmur (1/6 SEM) heard. Trace BLE swelling. No calf TTP  Pulmonary/Chest: Effort normal. No respiratory distress. He has wheezes (end expiratory). He has no rales. He exhibits no tenderness.  Abdominal: Soft. Bowel sounds are normal. He exhibits distension. He exhibits no abdominal bruit, no pulsatile midline mass and no mass. There is no hepatomegaly. There is no tenderness. There is no rebound and no guarding.  Pannus noted; no secondary signs of infection; obese  Genitourinary:  Genitourinary Comments: Foley cath intact  and DTG clear yellow urine  Musculoskeletal: He exhibits edema (small and large).  Lymphadenopathy:    He has no cervical adenopathy.  Neurological: He is alert and oriented to person, place, and time. He has normal reflexes.  Skin: Skin is warm and dry. Rash (intertriguinous) noted.  Wounds: right buttock improving (new size 1.5 x 2); right shoulder improving (new size 1 x 1.5) per facility wound care note  Psychiatric: He has a normal mood and affect. His behavior is normal. Judgment and thought content normal.    Labs reviewed: Recent Labs    05/29/17 1417  09/03/17 1216 09/04/17 0940 09/05/17 0636 09/08/17 0443  12/17/17 12/24/17 01/13/18  NA  --    < >  --  134* 138 134*   < > 136* 132* 136*  K  --    < > 6.0* 4.2 3.8 3.4*   < > 5.1 4.3 4.6  CL  --    < >  --  99* 101 96*  --   --   --   --   CO2  --    < >  --  22 27 28   --   --   --   --   GLUCOSE  --    < >  --  283* 137* 190*  --   --   --   --   BUN  --    < >  --  19 13 15    < > 28* 20 21  CREATININE  --    < >  --  1.03 0.94 1.26*   < > 1.1 1.0 0.9  CALCIUM  --    < >  --  8.2* 8.8* 8.3*  --   --   --   --   MG 1.8  --  1.6*  --  1.7  --   --   --   --   --    < > = values in this interval not displayed.   Recent Labs    08/09/17 09/08/17 0443  AST 11* 13*  ALT 14 15*  ALKPHOS 92 70  BILITOT  --  0.3  PROT  --  5.9*  ALBUMIN  --  2.6*    Recent Labs    09/05/17 0636 09/06/17 0734 09/08/17 0443  12/17/17 12/24/17 01/13/18  WBC 12.3* 10.4 10.0   < > 14.7 12.1 9.4  NEUTROABS  --   --   --    < > 13 8 6   HGB 12.8* 11.5* 11.3*   < > 12.1* 11.6* 11.1*  HCT 38.9* 35.3* 34.6*   < > 37* 36* 34*  MCV 91.3 91.7 91.5  --   --   --   --   PLT 198 231 226   < > 258 235 263   < > = values in this interval not displayed.   Lab Results  Component Value Date   TSH 2.954 09/15/2015   Lab Results  Component Value Date   HGBA1C 9.7 01/13/2018   Lab Results  Component Value Date   CHOL 190 05/07/2017   HDL 38 05/07/2017   LDLCALC 92 05/07/2017   TRIG 299 (A) 05/07/2017   CHOLHDL 5.4 04/30/2015    Significant Diagnostic Results in last 30 days:  No results found.  Assessment/Plan   ICD-10-CM   1. Constipation, unspecified constipation type K59.00   2. Buttock wound, right, sequela S31.819S   3. Wound of right shoulder,  sequela S41.001S   4. Chronic pain syndrome G89.4   5. 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 Chronic indwelling Foley catheter Z96.0     FLEETS ENEMA X 1 FOR CONSTIPATION  Cont other meds as ordered  PT/OT/ST as indicated  Wound care as ordered  Foley cath care as indicated - foley changed last week  [facility] NP to follow  Will follow  Labs/tests ordered:  none   Keora Eccleston S. Perlie Gold  [facility] [address] [place], [place] [address] [phone] Cell (Monday-Friday 8 AM - 5 PM) [phone] After 5 PM and follow prompts

## 2018-04-21 NOTE — Patient Instructions (Signed)
[name] , Thank you for taking time to come for your Medicare Wellness Visit. I appreciate your ongoing commitment to your health goals. Please review the following plan we discussed and let me know if I can assist you in the future.   Screening recommendations/referrals: Colonoscopy due, declined Recommended yearly ophthalmology/optometry visit for glaucoma screening and checkup Recommended yearly dental visit for hygiene and checkup  Vaccinations: Influenza vaccine due 2019 fall season Pneumococcal vaccine excluded Tdap vaccine excluded Shingles vaccine not in past records    Advanced directives: in chart  Conditions/risks identified: none  Next appointment: Dr. Eulas Post makes rounds  Preventive Care 70 Years and Older, Male Preventive care refers to lifestyle choices and visits with your health care provider that can promote health and wellness. What does preventive care include?  A yearly physical exam. This is also called an annual well check.  Dental exams once or twice a year.  Routine eye exams. Ask your health care provider how often you should have your eyes checked.  Personal lifestyle choices, including:  Daily care of your teeth and gums.  Regular physical activity.  Eating a healthy diet.  Avoiding tobacco and drug use.  Limiting alcohol use.  Practicing safe sex.  Taking low doses of aspirin every day.  Taking vitamin and mineral supplements as recommended by your health care provider. What happens during an annual well check? The services and screenings done by your health care provider during your annual well check will depend on your age, overall health, lifestyle risk factors, and family history of disease. Counseling  Your health care provider may ask you questions about your:  Alcohol use.  Tobacco use.  Drug use.  Emotional well-being.  Home and relationship well-being.  Sexual activity.  Eating habits.  History of  falls.  Memory and ability to understand (cognition).  Work and work Statistician. Screening  You may have the following tests or measurements:  Height, weight, and BMI.  Blood pressure.  Lipid and cholesterol levels. These may be checked every 5 years, or more frequently if you are over 68 years old.  Skin check.  Lung cancer screening. You may have this screening every year starting at age 51 if you have a 30-pack-year history of smoking and currently smoke or have quit within the past 15 years.  Fecal occult blood test (FOBT) of the stool. You may have this test every year starting at age 57.  Flexible sigmoidoscopy or colonoscopy. You may have a sigmoidoscopy every 5 years or a colonoscopy every 10 years starting at age 39.  Prostate cancer screening. Recommendations will vary depending on your family history and other risks.  Hepatitis C blood test.  Hepatitis B blood test.  Sexually transmitted disease (STD) testing.  Diabetes screening. This is done by checking your blood sugar (glucose) after you have not eaten for a while (fasting). You may have this done every 1-3 years.  Abdominal aortic aneurysm (AAA) screening. You may need this if you are a current or former smoker.  Osteoporosis. You may be screened starting at age 85 if you are at high risk. Talk with your health care provider about your test results, treatment options, and if necessary, the need for more tests. Vaccines  Your health care provider may recommend certain vaccines, such as:  Influenza vaccine. This is recommended every year.  Tetanus, diphtheria, and acellular pertussis (Tdap, Td) vaccine. You may need a Td booster every 10 years.  Zoster vaccine. You may need this  after age 32.  Pneumococcal 13-valent conjugate (PCV13) vaccine. One dose is recommended after age 74.  Pneumococcal polysaccharide (PPSV23) vaccine. One dose is recommended after age 67. Talk to your health care provider about  which screenings and vaccines you need and how often you need them. This information is not intended to replace advice given to you by your health care provider. Make sure you discuss any questions you have with your health care provider. Document Released: 09/23/2015 Document Revised: 05/16/2016 Document Reviewed: 06/28/2015 Elsevier Interactive Patient Education  2017 [place] Prevention in the Home Falls can cause injuries. They can happen to people of all ages. There are many things you can do to make your home safe and to help prevent falls. What can I do on the outside of my home?  Regularly fix the edges of walkways and driveways and fix any cracks.  Remove anything that might make you trip as you walk through a door, such as a raised step or threshold.  Trim any bushes or trees on the path to your home.  Use bright outdoor lighting.  Clear any walking paths of anything that might make someone trip, such as rocks or tools.  Regularly check to see if handrails are loose or broken. Make sure that both sides of any steps have handrails.  Any raised decks and porches should have guardrails on the edges.  Have any leaves, snow, or ice cleared regularly.  Use sand or salt on walking paths during winter.  Clean up any spills in your garage right away. This includes oil or grease spills. What can I do in the bathroom?  Use night lights.  Install grab bars by the toilet and in the tub and shower. Do not use towel bars as grab bars.  Use non-skid mats or decals in the tub or shower.  If you need to sit down in the shower, use a plastic, non-slip stool.  Keep the floor dry. Clean up any water that spills on the floor as soon as it happens.  Remove soap buildup in the tub or shower regularly.  Attach bath mats securely with double-sided non-slip rug tape.  Do not have throw rugs and other things on the floor that can make you trip. What can I do in the  bedroom?  Use night lights.  Make sure that you have a light by your bed that is easy to reach.  Do not use any sheets or blankets that are too big for your bed. They should not hang down onto the floor.  Have a firm chair that has side arms. You can use this for support while you get dressed.  Do not have throw rugs and other things on the floor that can make you trip. What can I do in the kitchen?  Clean up any spills right away.  Avoid walking on wet floors.  Keep items that you use a lot in easy-to-reach places.  If you need to reach something above you, use a strong step stool that has a grab bar.  Keep electrical cords out of the way.  Do not use floor polish or wax that makes floors slippery. If you must use wax, use non-skid floor wax.  Do not have throw rugs and other things on the floor that can make you trip. What can I do with my stairs?  Do not leave any items on the stairs.  Make sure that there are handrails on both sides of the  stairs and use them. Fix handrails that are broken or loose. Make sure that handrails are as long as the stairways.  Check any carpeting to make sure that it is firmly attached to the stairs. Fix any carpet that is loose or worn.  Avoid having throw rugs at the top or bottom of the stairs. If you do have throw rugs, attach them to the floor with carpet tape.  Make sure that you have a light switch at the top of the stairs and the bottom of the stairs. If you do not have them, ask someone to add them for you. What else can I do to help prevent falls?  Wear shoes that:  Do not have high heels.  Have rubber bottoms.  Are comfortable and fit you well.  Are closed at the toe. Do not wear sandals.  If you use a stepladder:  Make sure that it is fully opened. Do not climb a closed stepladder.  Make sure that both sides of the stepladder are locked into place.  Ask someone to hold it for you, if possible.  Clearly mark and make  sure that you can see:  Any grab bars or handrails.  First and last steps.  Where the edge of each step is.  Use tools that help you move around (mobility aids) if they are needed. These include:  Canes.  Walkers.  Scooters.  Crutches.  Turn on the lights when you go into a dark area. Replace any light bulbs as soon as they burn out.  Set up your furniture so you have a clear path. Avoid moving your furniture around.  If any of your floors are uneven, fix them.  If there are any pets around you, be aware of where they are.  Review your medicines with your doctor. Some medicines can make you feel dizzy. This can increase your chance of falling. Ask your doctor what other things that you can do to help prevent falls. This information is not intended to replace advice given to you by your health care provider. Make sure you discuss any questions you have with your health care provider. Document Released: 06/23/2009 Document Revised: 02/02/2016 Document Reviewed: 10/01/2014 Elsevier Interactive Patient Education  2017 [employer].

## 2018-04-22 ENCOUNTER — Encounter: Payer: Self-pay | Admitting: Internal Medicine

## 2018-04-30 ENCOUNTER — Encounter: Payer: Self-pay | Admitting: Internal Medicine

## 2018-05-29 ENCOUNTER — Inpatient Hospital Stay (HOSPITAL_COMMUNITY)
Admission: EM | Admit: 2018-05-29 | Discharge: 2018-05-31 | DRG: 698 | Disposition: A | Discharge status: Skilled Nursing Facility | Payer: Medicare Other | Attending: Internal Medicine | Admitting: Internal Medicine

## 2018-05-29 ENCOUNTER — Emergency Department (HOSPITAL_COMMUNITY): Payer: Medicare Other

## 2018-05-29 ENCOUNTER — Other Ambulatory Visit: Payer: Self-pay

## 2018-05-29 ENCOUNTER — Encounter (HOSPITAL_COMMUNITY): Payer: Self-pay | Admitting: Obstetrics and Gynecology

## 2018-05-29 DIAGNOSIS — I5033 Acute on chronic diastolic (congestive) heart failure: Secondary | ICD-10-CM | POA: Diagnosis not present

## 2018-05-29 DIAGNOSIS — R112 Nausea with vomiting, unspecified: Secondary | ICD-10-CM | POA: Diagnosis not present

## 2018-05-29 DIAGNOSIS — Y846 Urinary catheterization as the cause of abnormal reaction of the patient, or of later complication, without mention of misadventure at the time of the procedure: Secondary | ICD-10-CM | POA: Diagnosis present

## 2018-05-29 DIAGNOSIS — F329 Major depressive disorder, single episode, unspecified: Secondary | ICD-10-CM | POA: Diagnosis present

## 2018-05-29 DIAGNOSIS — Z885 Allergy status to narcotic agent status: Secondary | ICD-10-CM

## 2018-05-29 DIAGNOSIS — Z86711 Personal history of pulmonary embolism: Secondary | ICD-10-CM | POA: Diagnosis not present

## 2018-05-29 DIAGNOSIS — Z960 Presence of urogenital implants: Secondary | ICD-10-CM

## 2018-05-29 DIAGNOSIS — E785 Hyperlipidemia, unspecified: Secondary | ICD-10-CM | POA: Diagnosis present

## 2018-05-29 DIAGNOSIS — L109 Pemphigus, unspecified: Secondary | ICD-10-CM | POA: Diagnosis not present

## 2018-05-29 DIAGNOSIS — G2581 Restless legs syndrome: Secondary | ICD-10-CM | POA: Diagnosis present

## 2018-05-29 DIAGNOSIS — T83518A Infection and inflammatory reaction due to other urinary catheter, initial encounter: Secondary | ICD-10-CM | POA: Diagnosis present

## 2018-05-29 DIAGNOSIS — L03312 Cellulitis of back [any part except buttock]: Secondary | ICD-10-CM | POA: Diagnosis present

## 2018-05-29 DIAGNOSIS — Z8614 Personal history of Methicillin resistant Staphylococcus aureus infection: Secondary | ICD-10-CM | POA: Diagnosis not present

## 2018-05-29 DIAGNOSIS — Z79899 Other long term (current) drug therapy: Secondary | ICD-10-CM

## 2018-05-29 DIAGNOSIS — Z66 Do not resuscitate: Secondary | ICD-10-CM | POA: Diagnosis present

## 2018-05-29 DIAGNOSIS — I2510 Atherosclerotic heart disease of native coronary artery without angina pectoris: Secondary | ICD-10-CM | POA: Diagnosis present

## 2018-05-29 DIAGNOSIS — G4733 Obstructive sleep apnea (adult) (pediatric): Secondary | ICD-10-CM | POA: Diagnosis present

## 2018-05-29 DIAGNOSIS — Z8673 Personal history of transient ischemic attack (TIA), and cerebral infarction without residual deficits: Secondary | ICD-10-CM | POA: Diagnosis not present

## 2018-05-29 DIAGNOSIS — L03317 Cellulitis of buttock: Secondary | ICD-10-CM | POA: Diagnosis not present

## 2018-05-29 DIAGNOSIS — Z7901 Long term (current) use of anticoagulants: Secondary | ICD-10-CM

## 2018-05-29 DIAGNOSIS — F419 Anxiety disorder, unspecified: Secondary | ICD-10-CM | POA: Diagnosis present

## 2018-05-29 DIAGNOSIS — Z794 Long term (current) use of insulin: Secondary | ICD-10-CM

## 2018-05-29 DIAGNOSIS — L89151 Pressure ulcer of sacral region, stage 1: Secondary | ICD-10-CM | POA: Diagnosis not present

## 2018-05-29 DIAGNOSIS — Z8619 Personal history of other infectious and parasitic diseases: Secondary | ICD-10-CM | POA: Diagnosis present

## 2018-05-29 DIAGNOSIS — Z7952 Long term (current) use of systemic steroids: Secondary | ICD-10-CM

## 2018-05-29 DIAGNOSIS — E871 Hypo-osmolality and hyponatremia: Secondary | ICD-10-CM | POA: Diagnosis present

## 2018-05-29 DIAGNOSIS — E1143 Type 2 diabetes mellitus with diabetic autonomic (poly)neuropathy: Secondary | ICD-10-CM | POA: Diagnosis present

## 2018-05-29 DIAGNOSIS — L89159 Pressure ulcer of sacral region, unspecified stage: Secondary | ICD-10-CM | POA: Diagnosis present

## 2018-05-29 DIAGNOSIS — K219 Gastro-esophageal reflux disease without esophagitis: Secondary | ICD-10-CM | POA: Diagnosis present

## 2018-05-29 DIAGNOSIS — Z978 Presence of other specified devices: Secondary | ICD-10-CM

## 2018-05-29 DIAGNOSIS — N390 Urinary tract infection, site not specified: Secondary | ICD-10-CM | POA: Diagnosis present

## 2018-05-29 DIAGNOSIS — Z86718 Personal history of other venous thrombosis and embolism: Secondary | ICD-10-CM | POA: Diagnosis not present

## 2018-05-29 DIAGNOSIS — J810 Acute pulmonary edema: Secondary | ICD-10-CM | POA: Diagnosis not present

## 2018-05-29 DIAGNOSIS — Z6841 Body Mass Index (BMI) 40.0 and over, adult: Secondary | ICD-10-CM | POA: Diagnosis not present

## 2018-05-29 DIAGNOSIS — E875 Hyperkalemia: Secondary | ICD-10-CM | POA: Diagnosis present

## 2018-05-29 DIAGNOSIS — E1142 Type 2 diabetes mellitus with diabetic polyneuropathy: Secondary | ICD-10-CM | POA: Diagnosis present

## 2018-05-29 DIAGNOSIS — Z96651 Presence of right artificial knee joint: Secondary | ICD-10-CM | POA: Diagnosis present

## 2018-05-29 DIAGNOSIS — T83511A Infection and inflammatory reaction due to indwelling urethral catheter, initial encounter: Secondary | ICD-10-CM | POA: Diagnosis not present

## 2018-05-29 DIAGNOSIS — IMO0002 Reserved for concepts with insufficient information to code with codable children: Secondary | ICD-10-CM | POA: Diagnosis present

## 2018-05-29 DIAGNOSIS — I110 Hypertensive heart disease with heart failure: Secondary | ICD-10-CM | POA: Diagnosis present

## 2018-05-29 DIAGNOSIS — E2740 Unspecified adrenocortical insufficiency: Secondary | ICD-10-CM | POA: Diagnosis present

## 2018-05-29 DIAGNOSIS — E1165 Type 2 diabetes mellitus with hyperglycemia: Secondary | ICD-10-CM | POA: Diagnosis not present

## 2018-05-29 DIAGNOSIS — Z888 Allergy status to other drugs, medicaments and biological substances status: Secondary | ICD-10-CM

## 2018-05-29 LAB — COMPREHENSIVE METABOLIC PANEL
ALT: 15 U/L (ref 0–44)
ANION GAP: 9 (ref 5–15)
AST: 17 U/L (ref 15–41)
Albumin: 2.6 g/dL — ABNORMAL LOW (ref 3.5–5.0)
Alkaline Phosphatase: 147 U/L — ABNORMAL HIGH (ref 38–126)
BILIRUBIN TOTAL: 0.5 mg/dL (ref 0.3–1.2)
BUN: 10 mg/dL (ref 8–23)
CHLORIDE: 96 mmol/L — AB (ref 98–111)
CO2: 26 mmol/L (ref 22–32)
Calcium: 8.5 mg/dL — ABNORMAL LOW (ref 8.9–10.3)
Creatinine, Ser: 1.06 mg/dL (ref 0.61–1.24)
GFR calc Af Amer: >60 mL/min (ref >60)
GFR calc non Af Amer: >60 mL/min (ref >60)
GLUCOSE: 234 mg/dL — AB (ref 70–99)
POTASSIUM: 5.7 mmol/L — AB (ref 3.5–5.1)
Sodium: 131 mmol/L — ABNORMAL LOW (ref 135–145)
Total Protein: 6.1 g/dL — ABNORMAL LOW (ref 6.5–8.1)

## 2018-05-29 LAB — URINALYSIS, ROUTINE W REFLEX MICROSCOPIC
BILIRUBIN URINE: NEGATIVE
GLUCOSE, UA: NEGATIVE mg/dL
Hgb urine dipstick: NEGATIVE
KETONES UR: 5 mg/dL — AB
Nitrite: NEGATIVE
PROTEIN: 100 mg/dL — AB
Specific Gravity, Urine: 1.017 (ref 1.005–1.030)
pH: 7 (ref 5.0–8.0)

## 2018-05-29 LAB — POC OCCULT BLOOD, ED: FECAL OCCULT BLD: NEGATIVE

## 2018-05-29 LAB — CBC
HEMATOCRIT: 30 % — AB (ref 39.0–52.0)
HEMOGLOBIN: 9.6 g/dL — AB (ref 13.0–17.0)
MCH: 29.5 pg (ref 26.0–34.0)
MCHC: 32 g/dL (ref 30.0–36.0)
MCV: 92.3 fL (ref 78.0–100.0)
Platelets: 320 10*3/uL (ref 150–400)
RBC: 3.25 MIL/uL — ABNORMAL LOW (ref 4.22–5.81)
RDW: 15.1 % (ref 11.5–15.5)
WBC: 10 10*3/uL (ref 4.0–10.5)

## 2018-05-29 LAB — TROPONIN I: Troponin I: <0.03 ng/mL (ref <0.03)

## 2018-05-29 LAB — LIPASE, BLOOD: Lipase: 20 U/L (ref 11–51)

## 2018-05-29 LAB — BRAIN NATRIURETIC PEPTIDE: B NATRIURETIC PEPTIDE 5: 522.7 pg/mL — AB (ref 0.0–100.0)

## 2018-05-29 LAB — GLUCOSE, CAPILLARY: GLUCOSE-CAPILLARY: 230 mg/dL — AB (ref 70–99)

## 2018-05-29 MED ORDER — RIVAROXABAN 10 MG PO TABS
10.0000 mg | ORAL_TABLET | Freq: Every day | ORAL | Status: DC
  Administered 2018-05-30 – 2018-05-31 (×2): 10 mg via ORAL
  Filled 2018-05-29 (×2): qty 1

## 2018-05-29 MED ORDER — ADULT MULTIVITAMIN W/MINERALS CH
1.0000 | ORAL_TABLET | Freq: Every day | ORAL | Status: DC
  Filled 2018-05-29 (×2): qty 1

## 2018-05-29 MED ORDER — ESCITALOPRAM OXALATE 10 MG PO TABS
5.0000 mg | ORAL_TABLET | Freq: Every day | ORAL | Status: DC
  Administered 2018-05-30 – 2018-05-31 (×2): 5 mg via ORAL
  Filled 2018-05-29 (×2): qty 1

## 2018-05-29 MED ORDER — GUAIFENESIN ER 600 MG PO TB12: 600.0000 mg | ORAL_TABLET | Freq: Two times a day (BID) | ORAL | Status: DC | PRN

## 2018-05-29 MED ORDER — GI COCKTAIL ~~LOC~~
30.0000 mL | Freq: Once | ORAL | Status: AC
  Administered 2018-05-29: 30 mL via ORAL
  Filled 2018-05-29: qty 30

## 2018-05-29 MED ORDER — SODIUM CHLORIDE 0.9 % IV SOLN
1.0000 g | Freq: Once | INTRAVENOUS | Status: DC
  Administered 2018-05-29: 1 g via INTRAVENOUS
  Filled 2018-05-29: qty 10

## 2018-05-29 MED ORDER — EZETIMIBE 10 MG PO TABS
10.0000 mg | ORAL_TABLET | Freq: Every day | ORAL | Status: DC
  Administered 2018-05-30 – 2018-05-31 (×2): 10 mg via ORAL
  Filled 2018-05-29 (×2): qty 1

## 2018-05-29 MED ORDER — FOLIC ACID 1 MG PO TABS
1.0000 mg | ORAL_TABLET | Freq: Every day | ORAL | Status: DC
  Administered 2018-05-30 – 2018-05-31 (×2): 1 mg via ORAL
  Filled 2018-05-29 (×2): qty 1

## 2018-05-29 MED ORDER — IPRATROPIUM-ALBUTEROL 0.5-2.5 (3) MG/3ML IN SOLN: 3.0000 mL | Freq: Four times a day (QID) | RESPIRATORY_TRACT | Status: DC | PRN

## 2018-05-29 MED ORDER — TRAMADOL HCL 50 MG PO TABS
100.0000 mg | ORAL_TABLET | Freq: Three times a day (TID) | ORAL | Status: DC
  Administered 2018-05-30 – 2018-05-31 (×5): 100 mg via ORAL
  Filled 2018-05-29 (×5): qty 2

## 2018-05-29 MED ORDER — METOPROLOL SUCCINATE ER 25 MG PO TB24: 25.0000 mg | ORAL_TABLET | Freq: Every day | ORAL | Status: DC

## 2018-05-29 MED ORDER — PANTOPRAZOLE SODIUM 40 MG PO TBEC
40.0000 mg | DELAYED_RELEASE_TABLET | Freq: Every day | ORAL | Status: DC
  Administered 2018-05-30 – 2018-05-31 (×3): 40 mg via ORAL
  Filled 2018-05-29 (×3): qty 1

## 2018-05-29 MED ORDER — TORSEMIDE 20 MG PO TABS: 20.0000 mg | ORAL_TABLET | Freq: Every day | ORAL | Status: DC

## 2018-05-29 MED ORDER — GABAPENTIN 100 MG PO CAPS
200.0000 mg | ORAL_CAPSULE | Freq: Two times a day (BID) | ORAL | Status: DC
  Administered 2018-05-30 – 2018-05-31 (×4): 200 mg via ORAL
  Filled 2018-05-29 (×4): qty 2

## 2018-05-29 MED ORDER — BISACODYL 10 MG RE SUPP: 10.0000 mg | Freq: Every day | RECTAL | Status: DC | PRN

## 2018-05-29 MED ORDER — IOPAMIDOL (ISOVUE-300) INJECTION 61%
100.0000 mL | Freq: Once | INTRAVENOUS | Status: AC | PRN
  Administered 2018-05-29: 100 mL via INTRAVENOUS

## 2018-05-29 MED ORDER — DICLOFENAC SODIUM 1 % TD GEL
2.0000 g | Freq: Three times a day (TID) | TRANSDERMAL | Status: DC
  Administered 2018-05-30 – 2018-05-31 (×5): 2 g via TOPICAL
  Filled 2018-05-29: qty 100

## 2018-05-29 MED ORDER — TRAZODONE HCL 100 MG PO TABS
100.0000 mg | ORAL_TABLET | Freq: Every day | ORAL | Status: DC
  Administered 2018-05-30 (×2): 100 mg via ORAL
  Filled 2018-05-29 (×2): qty 1

## 2018-05-29 MED ORDER — PREDNISONE 20 MG PO TABS: 10.0000 mg | ORAL_TABLET | Freq: Every day | ORAL | Status: DC

## 2018-05-29 MED ORDER — SENNOSIDES-DOCUSATE SODIUM 8.6-50 MG PO TABS: 2.0000 | ORAL_TABLET | Freq: Every day | ORAL | Status: DC

## 2018-05-29 MED ORDER — AMLODIPINE BESYLATE 5 MG PO TABS: 5.0000 mg | ORAL_TABLET | Freq: Every day | ORAL | Status: DC

## 2018-05-29 MED ORDER — ROPINIROLE HCL 0.5 MG PO TABS
0.5000 mg | ORAL_TABLET | Freq: Every day | ORAL | Status: DC
  Filled 2018-05-29 (×2): qty 1

## 2018-05-29 MED ORDER — ALBUTEROL SULFATE (2.5 MG/3ML) 0.083% IN NEBU
5.0000 mg | INHALATION_SOLUTION | Freq: Once | RESPIRATORY_TRACT | Status: AC
  Administered 2018-05-29: 5 mg via RESPIRATORY_TRACT
  Filled 2018-05-29: qty 6

## 2018-05-29 MED ORDER — ACETAMINOPHEN 325 MG PO TABS
650.0000 mg | ORAL_TABLET | Freq: Four times a day (QID) | ORAL | Status: DC | PRN
  Administered 2018-05-31: 650 mg via ORAL
  Filled 2018-05-29: qty 2

## 2018-05-29 MED ORDER — METHOTREXATE 2.5 MG PO TABS: 10.0000 mg | ORAL_TABLET | ORAL | Status: DC

## 2018-05-29 MED ORDER — HYDROCORTISONE NA SUCCINATE PF 100 MG IJ SOLR
50.0000 mg | Freq: Three times a day (TID) | INTRAMUSCULAR | Status: DC
  Administered 2018-05-30 (×2): 50 mg via INTRAVENOUS
  Filled 2018-05-29 (×2): qty 2

## 2018-05-29 MED ORDER — SODIUM CHLORIDE 0.9 % IV SOLN
1.0000 g | Freq: Three times a day (TID) | INTRAVENOUS | Status: DC
  Administered 2018-05-30 – 2018-05-31 (×5): 1 g via INTRAVENOUS
  Filled 2018-05-29 (×7): qty 1

## 2018-05-29 MED ORDER — ACETAMINOPHEN 650 MG RE SUPP: 650.0000 mg | Freq: Four times a day (QID) | RECTAL | Status: DC | PRN

## 2018-05-29 MED ORDER — IOPAMIDOL (ISOVUE-300) INJECTION 61%
INTRAVENOUS | Status: AC
  Filled 2018-05-29: qty 100

## 2018-05-29 MED ORDER — INSULIN ASPART 100 UNIT/ML ~~LOC~~ SOLN: 10.0000 [IU] | Freq: Three times a day (TID) | SUBCUTANEOUS | Status: DC

## 2018-05-29 MED ORDER — VANCOMYCIN HCL IN DEXTROSE 1-5 GM/200ML-% IV SOLN
1000.0000 mg | Freq: Two times a day (BID) | INTRAVENOUS | Status: DC
  Administered 2018-05-30: 1000 mg via INTRAVENOUS
  Filled 2018-05-29 (×2): qty 200

## 2018-05-29 MED ORDER — INSULIN ASPART 100 UNIT/ML ~~LOC~~ SOLN
0.0000 [IU] | SUBCUTANEOUS | Status: DC
  Administered 2018-05-30 (×2): 3 [IU] via SUBCUTANEOUS
  Administered 2018-05-30: 5 [IU] via SUBCUTANEOUS
  Administered 2018-05-30 (×3): 3 [IU] via SUBCUTANEOUS
  Administered 2018-05-31: 2 [IU] via SUBCUTANEOUS
  Administered 2018-05-31: 5 [IU] via SUBCUTANEOUS

## 2018-05-29 MED ORDER — VITAMIN D 1000 UNITS PO TABS
1000.0000 [IU] | ORAL_TABLET | Freq: Every day | ORAL | Status: DC
  Administered 2018-05-30: 1000 [IU] via ORAL
  Filled 2018-05-29 (×2): qty 1

## 2018-05-29 MED ORDER — ONDANSETRON HCL 4 MG/2ML IJ SOLN
4.0000 mg | Freq: Once | INTRAMUSCULAR | Status: AC
  Administered 2018-05-29: 4 mg via INTRAVENOUS
  Filled 2018-05-29: qty 2

## 2018-05-29 MED ORDER — PROMETHAZINE HCL 25 MG/ML IJ SOLN: 12.5000 mg | Freq: Four times a day (QID) | INTRAMUSCULAR | Status: DC | PRN

## 2018-05-29 MED ORDER — ALUM & MAG HYDROXIDE-SIMETH 200-200-20 MG/5ML PO SUSP: 30.0000 mL | Freq: Four times a day (QID) | ORAL | Status: DC | PRN

## 2018-05-29 MED ORDER — POLYETHYLENE GLYCOL 3350 17 G PO PACK: 17.0000 g | PACK | Freq: Every day | ORAL | Status: DC | PRN

## 2018-05-29 MED ORDER — INSULIN ASPART 100 UNIT/ML ~~LOC~~ SOLN: 0.0000 [IU] | Freq: Three times a day (TID) | SUBCUTANEOUS | Status: DC

## 2018-05-29 MED ORDER — SODIUM CHLORIDE 0.9 % IV BOLUS
500.0000 mL | Freq: Once | INTRAVENOUS | Status: AC
  Administered 2018-05-29: 500 mL via INTRAVENOUS

## 2018-05-29 MED ORDER — INSULIN GLARGINE 100 UNIT/ML ~~LOC~~ SOLN: 60.0000 [IU] | Freq: Every day | SUBCUTANEOUS | Status: DC

## 2018-05-29 MED ORDER — ONDANSETRON HCL 4 MG PO TABS: 4.0000 mg | ORAL_TABLET | Freq: Four times a day (QID) | ORAL | Status: DC | PRN

## 2018-05-29 MED ORDER — INSULIN GLARGINE 100 UNIT/ML ~~LOC~~ SOLN
40.0000 [IU] | Freq: Every day | SUBCUTANEOUS | Status: DC
  Administered 2018-05-30: 40 [IU] via SUBCUTANEOUS
  Filled 2018-05-29 (×3): qty 0.4

## 2018-05-29 MED ORDER — HYDROXYZINE HCL 25 MG PO TABS
25.0000 mg | ORAL_TABLET | Freq: Three times a day (TID) | ORAL | Status: DC
  Administered 2018-05-30 – 2018-05-31 (×5): 25 mg via ORAL
  Filled 2018-05-29 (×5): qty 1

## 2018-05-29 MED ORDER — ONDANSETRON HCL 4 MG/2ML IJ SOLN: 4.0000 mg | Freq: Four times a day (QID) | INTRAMUSCULAR | Status: DC | PRN

## 2018-05-29 MED ORDER — FUROSEMIDE 10 MG/ML IJ SOLN
40.0000 mg | Freq: Once | INTRAMUSCULAR | Status: AC
  Administered 2018-05-29: 40 mg via INTRAVENOUS
  Filled 2018-05-29: qty 4

## 2018-05-29 MED ORDER — VANCOMYCIN HCL 10 G IV SOLR
2500.0000 mg | Freq: Once | INTRAVENOUS | Status: AC
  Administered 2018-05-30: 2500 mg via INTRAVENOUS
  Filled 2018-05-29: qty 2000

## 2018-05-29 MED ORDER — SIMETHICONE 80 MG PO CHEW: 125.0000 mg | CHEWABLE_TABLET | Freq: Four times a day (QID) | ORAL | Status: DC | PRN

## 2018-05-29 MED ORDER — VANCOMYCIN HCL 10 G IV SOLR: 1500.0000 mg | Freq: Two times a day (BID) | INTRAVENOUS | Status: DC

## 2018-05-29 NOTE — ED Notes (Signed)
Bed: WA06 Expected date:  Expected time:  Means of arrival:  Comments: EMS-N/V

## 2018-05-29 NOTE — ED Notes (Signed)
ED TO INPATIENT HANDOFF REPORT  Name/Age/Gender [name] 72 y.o. male  Code Status    Code Status Orders  (From admission, onward)         Start     Ordered   05/29/18 2101  Do not attempt resuscitation (DNR)  Continuous    Question Answer Comment  In the event of cardiac or respiratory ARREST Do not call a "code blue"   In the event of cardiac or respiratory ARREST Do not perform Intubation, CPR, defibrillation or ACLS   In the event of cardiac or respiratory ARREST Use medication by any route, position, wound care, and other measures to relive pain and suffering. May use oxygen, suction and manual treatment of airway obstruction as needed for comfort.      05/29/18 2107        Code Status History    Date Active Date Inactive Code Status Order ID Comments User Context   09/06/2017 0911 09/08/2017 2135 DNR [id number]  Jani Gravel, MD Inpatient   09/03/2017 0742 09/06/2017 0911 Full Code [id number]  Rondel Jumbo, PA-C ED   05/27/2017 2004 06/05/2017 1807 Full Code [id number]  Ivor Costa, MD ED   05/27/2016 1247 06/02/2016 1725 Full Code [id number]  Samella Parr, NP Inpatient   01/11/2016 1934 01/16/2016 1736 DNR [id number]  Ivor Costa, MD ED   01/11/2016 1631 01/11/2016 1934 DNR [id number]  Carmin Muskrat, MD ED   10/08/2015 0856 10/14/2015 1724 Full Code [id number]  Rama, Venetia Maxon, MD ED   09/23/2015 0207 09/25/2015 2142 Full Code [id number]  Ivor Costa, MD ED   09/07/2015 1559 09/15/2015 2205 Full Code [id number]  Theodis Blaze, MD ED   07/01/2015 1838 07/06/2015 1959 Full Code [id number]  Elmarie Shiley, MD Inpatient   05/04/2015 1703 05/06/2015 2048 Full Code [id number]  Wellington [place], MD Inpatient   05/02/2015 1523 05/04/2015 1703 Full Code [id number]  Lorretta Harp, MD Inpatient   04/30/2015 0209 05/02/2015 1523 Full Code [id number]  Sid Falcon, MD Inpatient   04/14/2015 1605 04/18/2015 1743 Full Code [id number]  Elgergawy, Silver Huguenin, MD Inpatient   12/07/2014 2155 12/09/2014  1720 Full Code [id number]  Barrett, Evelene Croon, PA-C Inpatient   11/15/2014 0156 11/19/2014 1617 Full Code [id number]  Allie Bossier, MD ED   11/10/2014 1709 11/11/2014 1738 Full Code [id number]  Samella Parr, NP Inpatient   08/27/2014 2116 08/31/2014 2049 Full Code [id number]  Elmarie Shiley, MD Inpatient   06/25/2014 0111 06/29/2014 1641 Full Code [id number]  Shanda Howells, MD ED   04/01/2014 1843 04/03/2014 1802 Full Code [id number]  Orson Eva, MD Inpatient   03/21/2014 1907 03/24/2014 1341 Full Code [id number]  Verlee Monte, MD Inpatient   12/23/2013 1356 12/26/2013 1922 DNR [id number]  Melton Alar, PA-C Inpatient   03/28/2012 0121 03/30/2012 1513 Full Code [id number]  Arlyss Repress., MD ED   03/28/2012 0116 03/28/2012 0121 Full Code [id number]  Arlyss Repress., MD ED   11/16/2011 0000 11/20/2011 1226 Full Code [id number]  Georgeanna Harrison, RN Inpatient   11/12/2011 1632 11/14/2011 1939 Full Code [id number]  Curtis Sites, RN Inpatient      Home/SNF/Other Nursing Home  Chief Complaint N/V  Level of Care/Admitting Diagnosis ED Disposition    ED Disposition Condition [facility] Area: [facility] [[id number]]  Level of Care: Telemetry [5]  Admit to tele based on following criteria: Complex arrhythmia (Bradycardia/Tachycardia)  Diagnosis: Intractable nausea and  vomiting [237628]  Admitting Physician: Doreatha Massed  Attending Physician: Etta Quill [phone]  Estimated length of stay: past midnight tomorrow  Certification:: I certify this patient will need inpatient services for at least 2 midnights  PT Class (Do Not Modify): Inpatient [101]  PT Acc Code (Do Not Modify): Private [1]       Medical History Past Medical History:  Diagnosis Date  . Arthritis    "hands, ankles, knees" (11/10/2014)  . Bladder spasms   . CAD (coronary artery disease)   . CAD in native artery 09/15/2015  . CHF (congestive heart failure) ([place])   . Chronic indwelling Foley catheter    . Chronic lower back pain   . Depression   . Diabetic diarrhea ([place])   . Diabetic neuropathy, painful ([place])   . DVT (deep venous thrombosis) ([facility]) 1980's   LLE  . GERD (gastroesophageal reflux disease)   . Hyperlipidemia   . Hypertension   . Neurogenic bladder   . Obese   . OSA (obstructive sleep apnea)    "they wanted me to wear a mask; I couldn't" (11/10/2014)  . Pneumonia 1999  . PONV (postoperative nausea and vomiting)   . Stroke [facility]) 10/2011; 06/2014   right hand numbness/notes 10/20/2011, pt not sure he had stroke in 2013; "right hand weaker and right face not quite right since" (11/10/2014)  . Type II diabetes mellitus ([place])   . Uncontrolled type II diabetes with peripheral autonomic neuropathy ([place]) 05/09/2015    Allergies Allergies  Allergen Reactions  . Ace Inhibitors Swelling    Pt tolerates lisinopril  . Lipitor [Atorvastatin Calcium] Swelling  . Metformin And Related Swelling  . Morphine And Related Other (See Comments)    Sweating, feels like is "in rocky boat."  . Robaxin [Methocarbamol] Other (See Comments)    Feels like he is shaky    IV Location/Drains/Wounds Patient Lines/Drains/Airways Status   Active Line/Drains/Airways    Name:   Placement date:   Placement time:   Site:   Days:   Peripheral IV 05/29/18 Right Arm   05/29/18    1557    Arm   less than 1   Urethral Catheter RDS Non-latex;Straight-tip 16 Fr.   05/29/18    2022    Non-latex;Straight-tip   less than 1          Labs/Imaging Results for orders placed or performed during the hospital encounter of 05/29/18 (from the past 48 hour(s))  Troponin I     Status: None   Collection Time: 05/29/18  3:58 PM  Result Value Ref Range   Troponin I <0.03 <0.03 ng/mL    Comment: Performed at [facility], [place] [address] [place], [place] [address]  Brain natriuretic peptide     Status: Abnormal   Collection Time: 05/29/18  3:58 PM  Result Value Ref Range   B Natriuretic Peptide 522.7  (H) 0.0 - 100.0 pg/mL    Comment: Performed at [facility], [place] [address] [place], [place] [address]  Lipase, blood     Status: None   Collection Time: 05/29/18  3:59 PM  Result Value Ref Range   Lipase 20 11 - 51 U/L    Comment: Performed at [facility], [place] [address], [place], [place] [address]  Comprehensive metabolic panel     Status: Abnormal   Collection Time: 05/29/18  3:59 PM  Result Value Ref Range   Sodium 131 (L) 135 - 145  mmol/L   Potassium 5.7 (H) 3.5 - 5.1 mmol/L   Chloride 96 (L) 98 - 111 mmol/L   CO2 26 22 - 32 mmol/L   Glucose, Bld 234 (H) 70 - 99 mg/dL   BUN 10 8 - 23 mg/dL   Creatinine, Ser 1.06 0.61 - 1.24 mg/dL   Calcium 8.5 (L) 8.9 - 10.3 mg/dL   Total Protein 6.1 (L) 6.5 - 8.1 g/dL   Albumin 2.6 (L) 3.5 - 5.0 g/dL   AST 17 15 - 41 U/L   ALT 15 0 - 44 U/L   Alkaline Phosphatase 147 (H) 38 - 126 U/L   Total Bilirubin 0.5 0.3 - 1.2 mg/dL   GFR calc non Af Amer >60 >60 mL/min   GFR calc Af Amer >60 >60 mL/min    Comment: (NOTE) The eGFR has been calculated using the CKD EPI equation. This calculation has not been validated in all clinical situations. eGFR's persistently <60 mL/min signify possible Chronic Kidney Disease.    Anion gap 9 5 - 15    Comment: Performed at [facility], [place] [address], [place], [place] [address]  CBC     Status: Abnormal   Collection Time: 05/29/18  3:59 PM  Result Value Ref Range   WBC 10.0 4.0 - 10.5 K/uL   RBC 3.25 (L) 4.22 - 5.81 MIL/uL   Hemoglobin 9.6 (L) 13.0 - 17.0 g/dL   HCT 30.0 (L) 39.0 - 52.0 %   MCV 92.3 78.0 - 100.0 fL   MCH 29.5 26.0 - 34.0 pg   MCHC 32.0 30.0 - 36.0 g/dL   RDW 15.1 11.5 - 15.5 %   Platelets 320 150 - 400 K/uL    Comment: Performed at [facility], [place] [address] [place], [place] [address]  Urinalysis, Routine w reflex microscopic     Status: Abnormal   Collection Time: 05/29/18  3:59 PM  Result Value Ref  Range   Color, Urine YELLOW YELLOW   APPearance CLOUDY (A) CLEAR   Specific Gravity, Urine 1.017 1.005 - 1.030   pH 7.0 5.0 - 8.0   Glucose, UA NEGATIVE NEGATIVE mg/dL   Hgb urine dipstick NEGATIVE NEGATIVE   Bilirubin Urine NEGATIVE NEGATIVE   Ketones, ur 5 (A) NEGATIVE mg/dL   Protein, ur 100 (A) NEGATIVE mg/dL   Nitrite NEGATIVE NEGATIVE   Leukocytes, UA LARGE (A) NEGATIVE   RBC / HPF 6-10 0 - 5 RBC/hpf   WBC, UA >50 (H) 0 - 5 WBC/hpf   Bacteria, UA FEW (A) NONE SEEN   Squamous Epithelial / LPF 0-5 0 - 5   WBC Clumps PRESENT    Mucus PRESENT    Budding Yeast PRESENT     Comment: Performed at [facility], [place] [address] [place], [place] [address]  POC occult blood, ED     Status: None   Collection Time: 05/29/18  5:52 PM  Result Value Ref Range   Fecal Occult Bld NEGATIVE NEGATIVE   Dg Chest 2 View  Result Date: 05/29/2018 CLINICAL DATA:  Nausea, vomiting for 2 days, some shortness of breath EXAM: CHEST - 2 VIEW COMPARISON:  Chest x-ray of 09/05/2017 FINDINGS: The lungs are poorly aerated. However there is cardiomegaly present with moderate pulmonary vascular congestion. No pleural effusion is seen with certainty. No bony abnormality is noted. IMPRESSION: Cardiomegaly and probable moderate pulmonary vascular congestion. Electronically Signed   By: Ivar Drape M.D.   On: 05/29/2018 16:29   Ct  Abdomen Pelvis W Contrast  Result Date: 05/29/2018 CLINICAL DATA:  Nausea and vomiting. EXAM: CT ABDOMEN AND PELVIS WITH CONTRAST TECHNIQUE: Multidetector CT imaging of the abdomen and pelvis was performed using the standard protocol following bolus administration of intravenous contrast. CONTRAST:  147m ISOVUE-300 IOPAMIDOL (ISOVUE-300) INJECTION 61% COMPARISON:  05/27/2017 FINDINGS: Lower chest: Coronary artery calcification is evident. Bibasilar collapse/consolidation noted, left greater than right. Small bilateral pleural effusions are evident. Hepatobiliary: No focal  abnormality within the liver parenchyma. Gallbladder surgically absent. No intrahepatic or extrahepatic biliary dilation. Pancreas: No focal mass lesion. No dilatation of the main duct. No intraparenchymal cyst. No peripancreatic edema. Spleen: No splenomegaly. No focal mass lesion. Adrenals/Urinary Tract: Right adrenal gland unremarkable. 2.1 cm myelolipoma in the left adrenal gland is stable. 14 mm low-density lesion upper pole right kidney is stable. Left kidney unremarkable. No evidence for hydroureter. Bladder is decompressed. Stomach/Bowel: Surgical clips noted in the region of the gastric cardia with staple line noted proximal stomach Duodenum is normally positioned as is the ligament of Treitz. Small bowel is decompressed. The terminal ileum is normal. A The appendix is normal. No gross colonic mass. No colonic wall thickening. No substantial diverticular change. Vascular/Lymphatic: There is abdominal aortic atherosclerosis without aneurysm. There is no gastrohepatic or hepatoduodenal ligament lymphadenopathy. No intraperitoneal or retroperitoneal lymphadenopathy. No pelvic sidewall lymphadenopathy. Reproductive: The prostate gland and seminal vesicles have normal imaging features.Foley catheter balloon is inflated in the posterior urethra. Other: No intraperitoneal free fluid. Musculoskeletal: Degenerative changes noted right hip. Bones are diffusely demineralized. Anterior wedge compression deformity at T11 is stable. Degenerative changes noted at L4-5. Bilateral gynecomastia. IMPRESSION: 1. No acute findings in the abdomen or pelvis. Specifically, no findings to explain the history of nausea and vomiting. 2. Balloon for the Foley catheter is inflated in the posterior urethra. 3. 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 4.  Aortic Atherosclerois (ICD10-170.0) Electronically Signed   By: EMisty StanleyM.D.   On: 05/29/2018 17:57    Pending Labs Unresulted Labs (From admission, onward)    Start      Ordered   05/30/18 0500  Creatinine, serum  Every 48 hours,   R     05/29/18 2115   05/30/18 02841 Basic metabolic panel  Tomorrow morning,   R     05/29/18 2118   05/29/18 2113  Culture, blood (routine x 2)  BLOOD CULTURE X 2,   R     05/29/18 2112   05/29/18 2112  Cortisol  STAT,   R     05/29/18 2111   05/29/18 2059  HIV antibody (Routine Testing)  Once,   R     05/29/18 2107   05/29/18 1907  Urine culture  STAT,   STAT     05/29/18 1907   05/29/18 1713  Occult blood card to lab, stool  Once,   STAT     05/29/18 1712          Vitals/Pain Today's Vitals   05/29/18 1503 05/29/18 1505 05/29/18 1638 05/29/18 1748  BP: 112/76  (!) 145/77 127/62  Pulse: 99  (!) 102 97  Resp: 16  20 15   Temp: 98.3 F (36.8 C)     TempSrc: Oral     SpO2: 96%  100% 97%  PainSc:  7       Isolation Precautions No active isolations  Medications Medications  aluminum-magnesium hydroxide-simethicone (MAALOX) 200-200-20 MG/5ML suspension 30 mL (has no administration in time range)  bisacodyl (DULCOLAX) suppository 10  mg (has no administration in time range)  gabapentin (NEURONTIN) capsule 200 mg (has no administration in time range)  ipratropium-albuterol (DUONEB) 0.5-2.5 (3) MG/3ML nebulizer solution 3 mL (has no administration in time range)  pantoprazole (PROTONIX) EC tablet 40 mg (has no administration in time range)  rivaroxaban (XARELTO) tablet 10 mg (has no administration in time range)  multivitamin tablet 1 tablet (has no administration in time range)  hydrOXYzine (ATARAX/VISTARIL) tablet 25 mg (has no administration in time range)  guaiFENesin (MUCINEX) 12 hr tablet 600 mg (has no administration in time range)  escitalopram (LEXAPRO) tablet 5 mg (has no administration in time range)  ezetimibe (ZETIA) tablet 10 mg (has no administration in time range)  folic acid (FOLVITE) tablet 1 mg (has no administration in time range)  polyethylene glycol (MIRALAX / GLYCOLAX) packet 17 g (has no  administration in time range)  rOPINIRole (REQUIP) tablet 0.5 mg (has no administration in time range)  diclofenac sodium (VOLTAREN) 1 % transdermal gel 2 g (has no administration in time range)  Cholecalciferol 1,000 Units (has no administration in time range)  sennosides-docusate sodium (SENOKOT-S) 8.6-50 MG tablet 2 tablet (has no administration in time range)  simethicone (MYLICON) chewable tablet 120 mg (has no administration in time range)  traMADol (ULTRAM) tablet 100 mg (has no administration in time range)  traZODone (DESYREL) tablet 100 mg (has no administration in time range)  meropenem (MERREM) 1 g in sodium chloride 0.9 % 100 mL IVPB (has no administration in time range)  acetaminophen (TYLENOL) tablet 650 mg (has no administration in time range)    Or  acetaminophen (TYLENOL) suppository 650 mg (has no administration in time range)  insulin glargine (LANTUS) injection 40 Units (has no administration in time range)  ondansetron (ZOFRAN) tablet 4 mg (has no administration in time range)    Or  ondansetron (ZOFRAN) injection 4 mg (has no administration in time range)  promethazine (PHENERGAN) injection 12.5 mg (has no administration in time range)  insulin aspart (novoLOG) injection 0-15 Units (has no administration in time range)  hydrocortisone sodium succinate (SOLU-CORTEF) 100 MG injection 50 mg (has no administration in time range)  vancomycin (VANCOCIN) 2,500 mg in sodium chloride 0.9 % 500 mL IVPB (has no administration in time range)  vancomycin (VANCOCIN) 1,500 mg in sodium chloride 0.9 % 500 mL IVPB (has no administration in time range)  albuterol (PROVENTIL) (2.5 MG/3ML) 0.083% nebulizer solution 5 mg (5 mg Nebulization Given 05/29/18 1635)  ondansetron (ZOFRAN) injection 4 mg (4 mg Intravenous Given 05/29/18 1638)  sodium chloride 0.9 % bolus 500 mL (0 mLs Intravenous Stopped 05/29/18 1913)  gi cocktail (Maalox,Lidocaine,Donnatal) (30 mLs Oral Given 05/29/18 1652)   iopamidol (ISOVUE-300) 61 % injection 100 mL (100 mLs Intravenous Contrast Given 05/29/18 1725)  furosemide (LASIX) injection 40 mg (40 mg Intravenous Given 05/29/18 2002)    Mobility non-ambulatory

## 2018-05-29 NOTE — ED Notes (Signed)
Pt said his catheter has been in for two months straight, and it should have been changed a month ago.

## 2018-05-29 NOTE — ED Notes (Signed)
Pt reports having trouble breathing.  Pt lungs sounds clear and VSS.  Dr. Lita Mains made aware.

## 2018-05-29 NOTE — ED Provider Notes (Signed)
[facility] DEPT Provider Note   CSN: [id number] Arrival date & time: 05/29/18  1447     History   Chief Complaint Chief Complaint  Patient presents with  . Nausea  . Emesis    HPI [name] is a 72 y.o. male.  HPI Patient states that over the last 2 days he has had nausea with multiple episodes of vomiting.  States he has a pain radiating from his abdomen up into his chest and throat.  Also complains of shortness of breath over that period.  Has had increased wheezing but no definite cough.  No fever or chills.  Had loose stools today.  Noted small amount of red blood in the vomit. Past Medical History:  Diagnosis Date  . Arthritis    "hands, ankles, knees" (11/10/2014)  . Bladder spasms   . CAD (coronary artery disease)   . CAD in native artery 09/15/2015  . CHF (congestive heart failure) ([place])   . Chronic indwelling Foley catheter   . Chronic lower back pain   . Depression   . Diabetic diarrhea ([place])   . Diabetic neuropathy, painful ([place])   . DVT (deep venous thrombosis) (HCC) 1980's   LLE  . GERD (gastroesophageal reflux disease)   . Hyperlipidemia   . Hypertension   . Neurogenic bladder   . Obese   . OSA (obstructive sleep apnea)    "they wanted me to wear a mask; I couldn't" (11/10/2014)  . Pneumonia 1999  . PONV (postoperative nausea and vomiting)   . Stroke [facility]) 10/2011; 06/2014   right hand numbness/notes 10/20/2011, pt not sure he had stroke in 2013; "right hand weaker and right face not quite right since" (11/10/2014)  . Type II diabetes mellitus ([place])   . Uncontrolled type II diabetes with peripheral autonomic neuropathy ([place]) 05/09/2015    Patient Active Problem List   Diagnosis Date Noted  . Boil 09/08/2017  . Trigger finger 09/08/2017  . Acute pulmonary embolism ([place]) 09/07/2017  . Pressure injury of skin 09/06/2017  . Chest pain 09/03/2017  . Hyperkalemia 09/03/2017  . Allergic rhinitis 08/19/2017  . Anemia  06/25/2017  . Cellulitis of left hip 05/27/2017  . Acute cystitis without hematuria   . Dyslipidemia associated with type 2 diabetes mellitus ([place]) 01/17/2017  . 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 OSA on CPAP 05/27/2016  . Hypertensive heart disease with CHF (congestive heart failure) ([place]) 04/15/2016  . H/O: CVA (cerebrovascular accident) 01/22/2016  . Sepsis ([place]) 01/11/2016  . Depression   . Syncope 11/01/2015  . GERD (gastroesophageal reflux disease) 10/22/2015  . Restless legs 10/22/2015  . CAD in native artery 09/15/2015  . Morbid obesity ([place]) 07/03/2015  . Uncontrolled type II diabetes with peripheral autonomic neuropathy ([place]) 05/09/2015  . Hyperlipidemia LDL goal <70 05/02/2015  . Neurogenic bladder 04/26/2015  . UTI (urinary tract infection) 04/14/2015  . Abdominal pain 06/25/2014  . Chronic diastolic CHF (congestive heart failure) ([place]) 04/01/2014  . Chronic indwelling Foley catheter 12/23/2013  . Hypokalemia 03/11/2012  . Diabetic neuropathy ([place]) 10/10/2011    Past Surgical History:  Procedure Laterality Date  . CARDIAC CATHETERIZATION N/A 05/02/2015   Procedure: Left Heart Cath and Coronary Angiography;  Surgeon: Lorretta Harp, MD;  Location: [facility] CV LAB;  Service: Cardiovascular;  Laterality: N/A;  . CARDIAC CATHETERIZATION N/A 05/04/2015   Procedure: Left Heart Cath and Coronary Angiography;  Surgeon: Wellington Hampshire, MD;  Location: [facility] CV LAB;  Service: Cardiovascular;  Laterality: N/A;  . CARPAL TUNNEL RELEASE Bilateral ~ 2003-2004  . CHOLECYSTECTOMY OPEN  1970's?  [facility] GASTROPLASTY    . JOINT REPLACEMENT    . LEFT HEART CATHETERIZATION WITH CORONARY ANGIOGRAM N/A 10/24/2011   Procedure: LEFT HEART CATHETERIZATION WITH CORONARY ANGIOGRAM;  Surgeon: Candee Furbish, MD;  Location: [facility] CATH LAB;  Service: Cardiovascular;  Laterality: N/A;  right radial artery approach  . TOTAL KNEE ARTHROPLASTY Right 1990's        Home Medications    Prior to  Admission medications   Medication Sig Start Date End Date Taking? Authorizing Provider  acetaminophen (TYLENOL) 500 MG tablet Take 1,000 mg by mouth every 8 (eight) hours as needed.  01/10/18  Yes [provider]  aluminum-magnesium hydroxide-simethicone (MAALOX) 725-366-44 MG/5ML SUSP Take 30 mLs by mouth every 6 (six) hours as needed (indigestion).    Yes [provider]  amLODipine (NORVASC) 5 MG tablet Take 5 mg by mouth daily.   Yes [provider]  bisacodyl (DULCOLAX) 10 MG suppository Place 10 mg rectally daily as needed for moderate constipation. 04/19/18  Yes [provider]  Cholecalciferol 1000 units tablet Take 1,000 Units by mouth daily. 12/05/17  Yes [provider]  diclofenac sodium (VOLTAREN) 1 % GEL Apply topically 3 (three) times daily. Apply to left shoulder topically three times daily    Yes [provider]  escitalopram (LEXAPRO) 5 MG tablet Take 5 mg by mouth daily.   Yes [provider]  ezetimibe (ZETIA) 10 MG tablet Take 10 mg by mouth daily.   Yes [provider]  folic acid (FOLVITE) 1 MG tablet Take 1 mg by mouth daily.   Yes [provider]  gabapentin (NEURONTIN) 100 MG capsule Take 200 mg by mouth 2 (two) times daily.   Yes [provider]  guaiFENesin (MUCINEX) 600 MG 12 hr tablet Take 600 mg by mouth 2 (two) times daily as needed for cough.    Yes [provider]  hydrOXYzine (ATARAX/VISTARIL) 25 MG tablet Take 25 mg by mouth 3 (three) times daily.    Yes [provider]  Insulin Glargine (TOUJEO SOLOSTAR) 300 UNIT/ML SOPN Inject 85 Units into the skin at bedtime.  03/06/18  Yes [provider]  insulin lispro (HUMALOG) 100 UNIT/ML injection Inject 25 Units into the skin 3 (three) times daily with meals. Also inject 5 units subcutaneously every 8 hours as needed for CBG greater than 300 03/06/18  Yes [provider]  ipratropium-albuterol (DUONEB)  0.5-2.5 (3) MG/3ML SOLN Take 3 mLs by nebulization every 6 (six) hours as needed (sob).    Yes [provider]  linagliptin (TRADJENTA) 5 MG TABS tablet Take 5 mg by mouth daily.   Yes [provider]  methotrexate (RHEUMATREX) 10 MG tablet Take 10 mg by mouth every Wednesday. Caution: Chemotherapy. Protect from light.   Yes [provider]  metoprolol succinate (TOPROL-XL) 25 MG 24 hr tablet Take 25 mg by mouth daily.   Yes [provider]  Multiple Vitamin (MULTIVITAMIN) tablet Take 1 tablet by mouth daily.   Yes [provider]  nitroGLYCERIN (NITROSTAT) 0.4 MG SL tablet Place 0.4 mg under the tongue every 5 (five) minutes as needed for chest pain.   Yes [provider]  omeprazole (PRILOSEC) 20 MG capsule Take 20 mg by mouth daily.   Yes [provider]  ondansetron (ZOFRAN) 4 MG tablet Take 4 mg by mouth every 6 (six) hours as needed for nausea or  vomiting. 11/15/17  Yes [provider]  polyethylene glycol (MIRALAX / GLYCOLAX) packet Take 17 g by mouth daily as needed for mild constipation or moderate constipation. 04/19/18  Yes [provider]  potassium chloride (K-DUR) 10 MEQ tablet Take 10 mEq by mouth 4 (four) times daily.    Yes [provider]  predniSONE (DELTASONE) 10 MG tablet Take 10 mg by mouth daily.   Yes [provider]  rivaroxaban (XARELTO) 10 MG TABS tablet Take 10 mg by mouth daily.   Yes [provider]  rOPINIRole (REQUIP) 0.5 MG tablet Take 0.5 mg by mouth at bedtime. 2100   Yes [provider]  sennosides-docusate sodium (SENOKOT-S) 8.6-50 MG tablet Take 2 tablets by mouth at bedtime. 2100   Yes [provider]  simethicone (MYLICON) 161 MG chewable tablet Chew 125 mg by mouth every 6 (six) hours as needed for flatulence. 03/19/18  Yes [provider]  torsemide (DEMADEX) 20 MG tablet Take 20 mg by mouth daily.    Yes [provider]    traMADol (ULTRAM) 50 MG tablet Take 2 tablets (100 mg total) by mouth 3 (three) times daily. Hold if Lethargic or confused 01/13/18  Yes Eulas Post, [place], DO  traZODone (DESYREL) 100 MG tablet Take 100 mg by mouth at bedtime.  04/04/18  Yes [provider]  triamcinolone cream (KENALOG) 0.1 % Apply 1 application topically 2 (two) times daily.   Yes [provider]  vitamin C (ASCORBIC ACID) 500 MG tablet Take 500 mg by mouth 2 (two) times daily.   Yes [provider]  OXYGEN Inhale into the lungs. O2 at 2l/min via nasal canula as needed for shortness of breath    [provider]    Family History Family History  Problem Relation Age of Onset  . Diabetes type I Father   . Hypertension Mother   . Cancer Brother         Testicular Cancer  . Cancer Brother        Leukemia    Social History Social History   Tobacco Use  . Smoking status: Former Smoker    Packs/day: 2.00    Years: 20.00    Pack years: 40.00    Types: Cigarettes    Last attempt to quit: 09/10/1976    Years since quitting: 41.7  . Smokeless tobacco: Never Used  Substance Use Topics  . Alcohol use: No    Alcohol/week: 0.0 standard drinks    Comment: FORMER ALCOHOLIC; "sober since 09/60/4540"  . Drug use: No     Allergies   Ace inhibitors; Lipitor [atorvastatin calcium]; Metformin and related; Morphine and related; and Robaxin [methocarbamol]   Review of Systems Review of Systems  Constitutional: Negative for chills and fever.  HENT: Negative for congestion, sinus pressure and sore throat.   Respiratory: Positive for shortness of breath and wheezing. Negative for cough.   Cardiovascular: Positive for chest pain. Negative for palpitations and leg swelling.  Gastrointestinal: Positive for abdominal pain, diarrhea, nausea and vomiting. Negative for blood in stool and constipation.  Genitourinary: Negative for dysuria and frequency.  Musculoskeletal: Negative for back pain, neck pain  and neck stiffness.  Skin: Negative for rash and wound.  Neurological: Negative for dizziness, weakness, light-headedness, numbness and headaches.  All other systems reviewed and are negative.    Physical Exam Updated Vital Signs BP 127/62   Pulse 97   Temp 98.3 F (36.8 C) (Oral)   Resp 15   SpO2  97%   Physical Exam  Constitutional: He is oriented to person, place, and time. He appears well-developed and well-nourished. No distress.  HENT:  Head: Normocephalic and atraumatic.  Dry mucous membranes  Eyes: Pupils are equal, round, and reactive to light. Conjunctivae and EOM are normal.  Neck: Normal range of motion. Neck supple.  No meningismus  Cardiovascular: Normal rate and regular rhythm. Exam reveals no gallop and no friction rub.  No murmur heard. Pulmonary/Chest:  Increased respiratory effort.  Expiratory wheezes especially in the left lung field.  Abdominal: Soft. Bowel sounds are normal. There is tenderness. There is no rebound and no guarding.  Diffuse abdominal tenderness most pronounced in the epigastric, left upper quadrant, and right lower quadrants.  No rebound or guarding.  Musculoskeletal: Normal range of motion. He exhibits no edema or tenderness.  No lower extremity swelling, asymmetry or tenderness.  Neurological: He is alert and oriented to person, place, and time.  Moving all extremities without focal deficit.  Sensation intact.  Skin: Skin is warm and dry. Capillary refill takes less than 2 seconds. No rash noted. He is not diaphoretic. No erythema.  Psychiatric:  Anxious appearing  Nursing note and vitals reviewed.    ED Treatments / Results  Labs (all labs ordered are listed, but only abnormal results are displayed) Labs Reviewed  COMPREHENSIVE METABOLIC PANEL - Abnormal; Notable for the following components:      Result Value   Sodium 131 (*)    Potassium 5.7 (*)    Chloride 96 (*)    Glucose, Bld 234 (*)    Calcium 8.5 (*)    Total Protein  6.1 (*)    Albumin 2.6 (*)    Alkaline Phosphatase 147 (*)    All other components within normal limits  CBC - Abnormal; Notable for the following components:   RBC 3.25 (*)    Hemoglobin 9.6 (*)    HCT 30.0 (*)    All other components within normal limits  URINALYSIS, ROUTINE W REFLEX MICROSCOPIC - Abnormal; Notable for the following components:   APPearance CLOUDY (*)    Ketones, ur 5 (*)    Protein, ur 100 (*)    Leukocytes, UA LARGE (*)    WBC, UA >50 (*)    Bacteria, UA FEW (*)    All other components within normal limits  BRAIN NATRIURETIC PEPTIDE - Abnormal; Notable for the following components:   B Natriuretic Peptide 522.7 (*)    All other components within normal limits  URINE CULTURE  LIPASE, BLOOD  TROPONIN I  OCCULT BLOOD X 1 CARD TO LAB, STOOL  POC OCCULT BLOOD, ED    EKG None  Radiology Dg Chest 2 View  Result Date: 05/29/2018 CLINICAL DATA:  Nausea, vomiting for 2 days, some shortness of breath EXAM: CHEST - 2 VIEW COMPARISON:  Chest x-ray of 09/05/2017 FINDINGS: The lungs are poorly aerated. However there is cardiomegaly present with moderate pulmonary vascular congestion. No pleural effusion is seen with certainty. No bony abnormality is noted. IMPRESSION: Cardiomegaly and probable moderate pulmonary vascular congestion. Electronically Signed   By: Ivar Drape M.D.   On: 05/29/2018 16:29   Ct Abdomen Pelvis W Contrast  Result Date: 05/29/2018 CLINICAL DATA:  Nausea and vomiting. EXAM: CT ABDOMEN AND PELVIS WITH CONTRAST TECHNIQUE: Multidetector CT imaging of the abdomen and pelvis was performed using the standard protocol following bolus administration of intravenous contrast. CONTRAST:  160mL ISOVUE-300 IOPAMIDOL (ISOVUE-300) INJECTION 61% COMPARISON:  05/27/2017 FINDINGS: Lower chest:  Coronary artery calcification is evident. Bibasilar collapse/consolidation noted, left greater than right. Small bilateral pleural effusions are evident. Hepatobiliary: No focal  abnormality within the liver parenchyma. Gallbladder surgically absent. No intrahepatic or extrahepatic biliary dilation. Pancreas: No focal mass lesion. No dilatation of the main duct. No intraparenchymal cyst. No peripancreatic edema. Spleen: No splenomegaly. No focal mass lesion. Adrenals/Urinary Tract: Right adrenal gland unremarkable. 2.1 cm myelolipoma in the left adrenal gland is stable. 14 mm low-density lesion upper pole right kidney is stable. Left kidney unremarkable. No evidence for hydroureter. Bladder is decompressed. Stomach/Bowel: Surgical clips noted in the region of the gastric cardia with staple line noted proximal stomach Duodenum is normally positioned as is the ligament of Treitz. Small bowel is decompressed. The terminal ileum is normal. A The appendix is normal. No gross colonic mass. No colonic wall thickening. No substantial diverticular change. Vascular/Lymphatic: There is abdominal aortic atherosclerosis without aneurysm. There is no gastrohepatic or hepatoduodenal ligament lymphadenopathy. No intraperitoneal or retroperitoneal lymphadenopathy. No pelvic sidewall lymphadenopathy. Reproductive: The prostate gland and seminal vesicles have normal imaging features.Foley catheter balloon is inflated in the posterior urethra. Other: No intraperitoneal free fluid. Musculoskeletal: Degenerative changes noted right hip. Bones are diffusely demineralized. Anterior wedge compression deformity at T11 is stable. Degenerative changes noted at L4-5. Bilateral gynecomastia. IMPRESSION: 1. No acute findings in the abdomen or pelvis. Specifically, no findings to explain the history of nausea and vomiting. 2. Balloon for the Foley catheter is inflated in the posterior urethra. 3. 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 4.  Aortic Atherosclerois (ICD10-170.0) Electronically Signed   By: Misty Stanley M.D.   On: 05/29/2018 17:57    Procedures Procedures (including critical care time)  Medications  Ordered in ED Medications  albuterol (PROVENTIL) (2.5 MG/3ML) 0.083% nebulizer solution 5 mg (5 mg Nebulization Given 05/29/18 1635)  ondansetron (ZOFRAN) injection 4 mg (4 mg Intravenous Given 05/29/18 1638)  sodium chloride 0.9 % bolus 500 mL (0 mLs Intravenous Stopped 05/29/18 1913)  gi cocktail (Maalox,Lidocaine,Donnatal) (30 mLs Oral Given 05/29/18 1652)  iopamidol (ISOVUE-300) 61 % injection 100 mL (100 mLs Intravenous Contrast Given 05/29/18 1725)  furosemide (LASIX) injection 40 mg (40 mg Intravenous Given 05/29/18 2002)     Initial Impression / Assessment and Plan / ED Course  I have reviewed the triage vital signs and the nursing notes.  Pertinent labs & imaging results that were available during my care of the patient were reviewed by me and considered in my medical decision making (see chart for details).     Evidence of pulmonary congestion on chest x-ray.  CT abdomen pelvis without acute findings.  Foley catheter was replaced.  Urine with evidence of infection.  Urine culture sent.  Patient started on IV antibiotics.  Also given dose of IV Lasix.  Final Clinical Impressions(s) / ED Diagnoses   Final diagnoses:  Acute lower UTI  Acute pulmonary edema (HCC)  Non-intractable vomiting with nausea, unspecified vomiting type    ED Discharge Orders    None       Julianne Rice, MD 05/29/18 2046

## 2018-05-29 NOTE — ED Triage Notes (Signed)
Per EMS: Pt from [place] at [facility] is coming from a facility where the pt called 911 and stated the facility was not taking him seriously with his nausea and emesis x2 days. It is reported by the pt that he has a condition that he will "turn his head the wrong way and nod off".

## 2018-05-29 NOTE — Progress Notes (Signed)
Pharmacy Antibiotic Note  [name] is a 72 y.o. male with PMH ESBL UTI and MRSA infection admitted on 05/29/2018 with UTI and cellulitis. Pharmacy has been consulted for vancomycin and meropenem dosing.  Plan:  Vancomycin 2500 mg IV now, then 1000 mg IV q12 hr (est AUC 428 based on SCr 1.06)  Measure vancomycin AUC at steady state as indicated  Meropenem 1g IV q8 hr  SCr q48 hr while on vancomycin    Temp (24hrs), Avg:98.3 F (36.8 C), Min:98.3 F (36.8 C), Max:98.3 F (36.8 C)  Recent Labs  Lab 05/29/18 1559  WBC 10.0  CREATININE 1.06    CrCl cannot be calculated (Unknown ideal weight.).    Allergies  Allergen Reactions  . Ace Inhibitors Swelling    Pt tolerates lisinopril  . Lipitor [Atorvastatin Calcium] Swelling  . Metformin And Related Swelling  . Morphine And Related Other (See Comments)    Sweating, feels like is "in rocky boat."  . Robaxin [Methocarbamol] Other (See Comments)    Feels like he is shaky      Thank you for allowing pharmacy to be a part of this patient's care.  Reuel Boom, PharmD, BCPS [phone] 05/29/2018, 9:51 PM

## 2018-05-29 NOTE — H&P (Signed)
History and Physical    [name] ZOX:[id number] DOB: Oct 05, 1945 DOA: 05/29/2018  PCP: Gildardo Cranker, DO  Patient coming from: SNF  I have personally briefly reviewed patient's old medical records in [place]  Chief Complaint: N/V  HPI: [name] is a 72 y.o. 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  Patient presents to the ED with c/o 2 day history of intractable N/V.  He states he has been able to keep anything PO down including his medications during that time.  He also reports foley catheter hasnt been changed in 2 months.  Also has SOB over this period, increased wheezing.  Loose stools with last BM 8 am today.  No fevers nor chills.   ED Course: UA shows UTI.  CXR shows mild pulm edema, BNP 522, HGB 9.6 hemoccult neg, sodium 131, K 5.7, creat 1.0.  CT abd/pelvis found nothing acute.  Patient given rocephin, 500ns bolus then 40mg  IV lasix x1 in ED.  Hospitalist asked to admit.   Review of Systems: As per HPI otherwise 10 point review of systems negative.   Past Medical History:  Diagnosis Date  . Arthritis    "hands, ankles, knees" (11/10/2014)  . Bladder spasms   . CAD (coronary artery disease)   . CAD in native artery 09/15/2015  . CHF (congestive heart failure) ([place])   . Chronic indwelling Foley catheter   . Chronic lower back pain   . Depression   . Diabetic diarrhea ([place])   . Diabetic neuropathy, painful ([place])   . DVT (deep venous thrombosis) ([facility]) 1980's   LLE  . GERD (gastroesophageal reflux disease)   . Hyperlipidemia   . Hypertension   . Neurogenic bladder   . Obese   . OSA (obstructive sleep apnea)    "they wanted me to wear a mask; I couldn't" (11/10/2014)  . Pneumonia 1999  . PONV (postoperative nausea and vomiting)   . Stroke [facility]) 10/2011; 06/2014   right hand numbness/notes 10/20/2011, pt not sure he had stroke in  2013; "right hand weaker and right face not quite right since" (11/10/2014)  . Type II diabetes mellitus ([place])   . Uncontrolled type II diabetes with peripheral autonomic neuropathy ([place]) 05/09/2015    Past Surgical History:  Procedure Laterality Date  . CARDIAC CATHETERIZATION N/A 05/02/2015   Procedure: Left Heart Cath and Coronary Angiography;  Surgeon: Lorretta Harp, MD;  Location: [facility];  Service: Cardiovascular;  Laterality: N/A;  . CARDIAC CATHETERIZATION N/A 05/04/2015   Procedure: Left Heart Cath and Coronary Angiography;  Surgeon: Wellington Hampshire, MD;  Location: [facility];  Service: Cardiovascular;  Laterality: N/A;  . CARPAL TUNNEL RELEASE Bilateral ~ 2003-2004  . CHOLECYSTECTOMY OPEN  1970's?  [facility] GASTROPLASTY    . JOINT REPLACEMENT    . LEFT HEART CATHETERIZATION WITH CORONARY ANGIOGRAM N/A 10/24/2011   Procedure: LEFT HEART CATHETERIZATION WITH CORONARY ANGIOGRAM;  Surgeon: Candee Furbish, MD;  Location: [facility] CATH LAB;  Service: Cardiovascular;  Laterality: N/A;  right radial artery approach  . TOTAL KNEE ARTHROPLASTY Right 1990's     reports that he quit smoking about 41 years ago. His smoking use included cigarettes. He has a 40.00 pack-year smoking history. He has never used smokeless tobacco. He reports that he does not drink alcohol or use drugs.  Allergies  Allergen Reactions  . Ace Inhibitors Swelling  Pt tolerates lisinopril  . Lipitor [Atorvastatin Calcium] Swelling  . Metformin And Related Swelling  . Morphine And Related Other (See Comments)    Sweating, feels like is "in rocky boat."  . Robaxin [Methocarbamol] Other (See Comments)    Feels like he is shaky    Family History  Problem Relation Age of Onset  . Diabetes type I Father   . Hypertension Mother   . Cancer Brother         Testicular Cancer  . Cancer Brother        Leukemia     Prior to Admission medications   Medication Sig Start Date End Date Taking? Authorizing Provider    acetaminophen (TYLENOL) 500 MG tablet Take 1,000 mg by mouth every 8 (eight) hours as needed.  01/10/18  Yes [provider]  aluminum-magnesium hydroxide-simethicone (MAALOX) 378-588-50 MG/5ML SUSP Take 30 mLs by mouth every 6 (six) hours as needed (indigestion).    Yes [provider]  amLODipine (NORVASC) 5 MG tablet Take 5 mg by mouth daily.   Yes [provider]  bisacodyl (DULCOLAX) 10 MG suppository Place 10 mg rectally daily as needed for moderate constipation. 04/19/18  Yes [provider]  Cholecalciferol 1000 units tablet Take 1,000 Units by mouth daily. 12/05/17  Yes [provider]  diclofenac sodium (VOLTAREN) 1 % GEL Apply topically 3 (three) times daily. Apply to left shoulder topically three times daily    Yes [provider]  escitalopram (LEXAPRO) 5 MG tablet Take 5 mg by mouth daily.   Yes [provider]  ezetimibe (ZETIA) 10 MG tablet Take 10 mg by mouth daily.   Yes [provider]  folic acid (FOLVITE) 1 MG tablet Take 1 mg by mouth daily.   Yes [provider]  gabapentin (NEURONTIN) 100 MG capsule Take 200 mg by mouth 2 (two) times daily.   Yes [provider]  guaiFENesin (MUCINEX) 600 MG 12 hr tablet Take 600 mg by mouth 2 (two) times daily as needed for cough.    Yes [provider]  hydrOXYzine (ATARAX/VISTARIL) 25 MG tablet Take 25 mg by mouth 3 (three) times daily.    Yes [provider]  Insulin Glargine (TOUJEO SOLOSTAR) 300 UNIT/ML SOPN Inject 85 Units into the skin at bedtime.  03/06/18  Yes [provider]  insulin lispro (HUMALOG) 100 UNIT/ML injection Inject 25 Units into the skin 3 (three) times daily with meals. Also inject 5 units subcutaneously every 8 hours as needed for CBG greater than 300 03/06/18  Yes [provider]  ipratropium-albuterol (DUONEB) 0.5-2.5 (3) MG/3ML SOLN Take 3 mLs by nebulization every 6 (six) hours as needed (sob).     Yes [provider]  linagliptin (TRADJENTA) 5 MG TABS tablet Take 5 mg by mouth daily.   Yes [provider]  methotrexate (RHEUMATREX) 10 MG tablet Take 10 mg by mouth every Wednesday. Caution: Chemotherapy. Protect from light.   Yes [provider]  metoprolol succinate (TOPROL-XL) 25 MG 24 hr tablet Take 25 mg by mouth daily.   Yes [provider]  Multiple Vitamin (MULTIVITAMIN) tablet Take 1 tablet by mouth daily.   Yes [provider]  nitroGLYCERIN (NITROSTAT) 0.4 MG SL tablet Place 0.4 mg under the tongue every 5 (five) minutes as needed for chest pain.   Yes [provider]  omeprazole (PRILOSEC) 20 MG capsule Take 20 mg by mouth daily.   Yes [provider]  ondansetron (ZOFRAN) 4  MG tablet Take 4 mg by mouth every 6 (six) hours as needed for nausea or vomiting. 11/15/17  Yes [provider]  polyethylene glycol (MIRALAX / GLYCOLAX) packet Take 17 g by mouth daily as needed for mild constipation or moderate constipation. 04/19/18  Yes [provider]  potassium chloride (K-DUR) 10 MEQ tablet Take 10 mEq by mouth 4 (four) times daily.    Yes [provider]  predniSONE (DELTASONE) 10 MG tablet Take 10 mg by mouth daily.   Yes [provider]  rivaroxaban (XARELTO) 10 MG TABS tablet Take 10 mg by mouth daily.   Yes [provider]  rOPINIRole (REQUIP) 0.5 MG tablet Take 0.5 mg by mouth at bedtime. 2100   Yes [provider]  sennosides-docusate sodium (SENOKOT-S) 8.6-50 MG tablet Take 2 tablets by mouth at bedtime. 2100   Yes [provider]  simethicone (MYLICON) 505 MG chewable tablet Chew 125 mg by mouth every 6 (six) hours as needed for flatulence. 03/19/18  Yes [provider]  torsemide (DEMADEX) 20 MG tablet Take 20 mg by mouth daily.    Yes [provider]  traMADol (ULTRAM) 50 MG tablet Take 2 tablets (100 mg total) by mouth 3 (three) times  daily. Hold if Lethargic or confused 01/13/18  Yes Eulas Post, [place], DO  traZODone (DESYREL) 100 MG tablet Take 100 mg by mouth at bedtime.  04/04/18  Yes [provider]  triamcinolone cream (KENALOG) 0.1 % Apply 1 application topically 2 (two) times daily.   Yes [provider]  vitamin C (ASCORBIC ACID) 500 MG tablet Take 500 mg by mouth 2 (two) times daily.   Yes [provider]  OXYGEN Inhale into the lungs. O2 at 2l/min via nasal canula as needed for shortness of breath    [provider]    Physical Exam: Vitals:   05/29/18 1458 05/29/18 1503 05/29/18 1638 05/29/18 1748  BP:  112/76 (!) 145/77 127/62  Pulse:  99 (!) 102 97  Resp:  16 20 15   Temp:  98.3 F (36.8 C)    TempSrc:  Oral    SpO2: 98% 96% 100% 97%    Constitutional: Dyspnic, falling asleep frequently. Eyes: PERRL, lids and conjunctivae normal ENMT: Mucous membranes are moist. Posterior pharynx clear of any exudate or lesions.Normal dentition.  Neck: normal, supple, no masses, no thyromegaly Respiratory: Dyspnic, expiratory wheezes in L lung field. Cardiovascular: Regular rate and rhythm, no murmurs / rubs / gallops. No extremity edema. 2+ pedal pulses. No carotid bruits.  Abdomen: Diffuse tenderness. Musculoskeletal: no clubbing / cyanosis. No joint deformity upper and lower extremities. Good ROM, no contractures. Normal muscle tone.  Skin: Sacral decubitus with surrounding erythema Neurologic: CN 2-12 grossly intact. Sensation intact, DTR normal. Strength 5/5 in all 4.  Psychiatric: Normal judgment and insight. Alert and oriented x 3. Normal mood.    Labs on Admission: I have personally reviewed following labs and imaging studies  CBC: Recent Labs  Lab 05/29/18 1559  WBC 10.0  HGB 9.6*  HCT 30.0*  MCV 92.3  PLT 397   Basic Metabolic Panel: Recent Labs  Lab 05/29/18 1559  NA 131*  K 5.7*  CL 96*  CO2 26  GLUCOSE 234*  BUN 10  CREATININE 1.06  CALCIUM 8.5*    GFR: CrCl cannot be calculated (Unknown ideal weight.). Liver Function Tests: Recent Labs  Lab 05/29/18 1559  AST 17  ALT 15  ALKPHOS 147*  BILITOT 0.5  PROT 6.1*  ALBUMIN 2.6*   Recent Labs  Lab 05/29/18 1559  LIPASE 20   No results for input(s): AMMONIA in the last 168 hours. Coagulation Profile: No results for input(s): INR, PROTIME in the last 168 hours. Cardiac Enzymes: Recent Labs  Lab 05/29/18 1558  TROPONINI <0.03   BNP (last 3 results) No results for input(s): PROBNP in the last 8760 hours. HbA1C: No results for input(s): HGBA1C in the last 72 hours. CBG: No results for input(s): GLUCAP in the last 168 hours. Lipid Profile: No results for input(s): CHOL, HDL, LDLCALC, TRIG, CHOLHDL, LDLDIRECT in the last 72 hours. Thyroid Function Tests: No results for input(s): TSH, T4TOTAL, FREET4, T3FREE, THYROIDAB in the last 72 hours. Anemia Panel: No results for input(s): VITAMINB12, FOLATE, FERRITIN, TIBC, IRON, RETICCTPCT in the last 72 hours. Urine analysis:    Component Value Date/Time   COLORURINE YELLOW 05/29/2018 1559   APPEARANCEUR CLOUDY (A) 05/29/2018 1559   LABSPEC 1.017 05/29/2018 1559   PHURINE 7.0 05/29/2018 1559   GLUCOSEU NEGATIVE 05/29/2018 1559   HGBUR NEGATIVE 05/29/2018 1559   BILIRUBINUR NEGATIVE 05/29/2018 1559   KETONESUR 5 (A) 05/29/2018 1559   PROTEINUR 100 (A) 05/29/2018 1559   UROBILINOGEN 1.0 07/01/2015 1455   NITRITE NEGATIVE 05/29/2018 1559   LEUKOCYTESUR LARGE (A) 05/29/2018 1559    Radiological Exams on Admission: Dg Chest 2 View  Result Date: 05/29/2018 CLINICAL DATA:  Nausea, vomiting for 2 days, some shortness of breath EXAM: CHEST - 2 VIEW COMPARISON:  Chest x-ray of 09/05/2017 FINDINGS: The lungs are poorly aerated. However there is cardiomegaly present with moderate pulmonary vascular congestion. No pleural effusion is seen with certainty. No bony abnormality is noted. IMPRESSION: Cardiomegaly and probable moderate  pulmonary vascular congestion. Electronically Signed   By: Ivar Drape M.D.   On: 05/29/2018 16:29   Ct Abdomen Pelvis W Contrast  Result Date: 05/29/2018 CLINICAL DATA:  Nausea and vomiting. EXAM: CT ABDOMEN AND PELVIS WITH CONTRAST TECHNIQUE: Multidetector CT imaging of the abdomen and pelvis was performed using the standard protocol following bolus administration of intravenous contrast. CONTRAST:  174mL ISOVUE-300 IOPAMIDOL (ISOVUE-300) INJECTION 61% COMPARISON:  05/27/2017 FINDINGS: Lower chest: Coronary artery calcification is evident. Bibasilar collapse/consolidation noted, left greater than right. Small bilateral pleural effusions are evident. Hepatobiliary: No focal abnormality within the liver parenchyma. Gallbladder surgically absent. No intrahepatic or extrahepatic biliary dilation. Pancreas: No focal mass lesion. No dilatation of the main duct. No intraparenchymal cyst. No peripancreatic edema. Spleen: No splenomegaly. No focal mass lesion. Adrenals/Urinary Tract: Right adrenal gland unremarkable. 2.1 cm myelolipoma in the left adrenal gland is stable. 14 mm low-density lesion upper pole right kidney is stable. Left kidney unremarkable. No evidence for hydroureter. Bladder is decompressed. Stomach/Bowel: Surgical clips noted in the region of the gastric cardia with staple line noted proximal stomach Duodenum is normally positioned as is the ligament of Treitz. Small bowel is decompressed. The terminal ileum is normal. A The appendix is normal. No gross colonic mass. No colonic wall thickening. No substantial diverticular change. Vascular/Lymphatic: There is abdominal aortic atherosclerosis without aneurysm. There is no gastrohepatic or hepatoduodenal ligament lymphadenopathy. No intraperitoneal or retroperitoneal lymphadenopathy. No pelvic sidewall lymphadenopathy. Reproductive: The prostate gland and seminal vesicles have normal imaging features.Foley catheter balloon is inflated in the posterior  urethra. Other: No intraperitoneal free fluid. Musculoskeletal: Degenerative changes noted right hip. Bones are diffusely demineralized. Anterior wedge compression deformity at T11 is stable. Degenerative changes noted at L4-5. Bilateral gynecomastia. IMPRESSION: 1. No acute findings in the  abdomen or pelvis. Specifically, no findings to explain the history of nausea and vomiting. 2. Balloon for the Foley catheter is inflated in the posterior urethra. 3. 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 4.  Aortic Atherosclerois (ICD10-170.0) Electronically Signed   By: Misty Stanley M.D.   On: 05/29/2018 17:57    EKG: Independently reviewed.  Assessment/Plan Principal Problem:   Intractable nausea and vomiting Active Problems:   OSA (obstructive sleep apnea)   Chronic indwelling Foley catheter   Acute on chronic diastolic CHF (congestive heart failure) ([facility])   Catheter-associated urinary tract infection ([facility])   Uncontrolled type II diabetes with peripheral autonomic neuropathy ([facility])   Bullous pemphigus   Hyperkalemia   Sacral decubitus ulcer   History of pulmonary embolism   History of ESBL E. coli infection   History of MRSA infection   Acute adrenal insufficiency ([place])    1. Intractable nausea and vomiting - either due to UTI, cellulitis, or adrenal crisis from not being able to keep his PO prednisone down. 1. Zofran PRN 2. Phenergan PRN breakthrough 3. Treating each of the above possible causes as below. 2. UTI - 1. Merrem empirically given h/o ESBL 2. UCx pending 3. Sacral decubitus with cellulitis - 1. Vanc empirically given h/o MRSA 2. BCx pending 3. Wound care consult 4. Concern for acute adrenal insufficiency - Hyponatremia, hyperkalemia, intractable N/V and diffuse abd tenderness.  Occurring in setting of inability to keep chronic PO prednisone down for past 2 days. 1. Cortisol ordered 2. Empirically treating with stress dose steroids: Hydrocortisone 50mg  IV Q8H 3. CXR and BNP  and resp symptoms suggest fluid overload actually, so will hold off on further IVF for the moment unless tachycardia worsens or he becomes hypotensive. 4. Tele monitor 5. SOB, acute on chronic diastolic CHF - 1. Got lasix 40mg  IV x1 in ED 2. Will hold further diuretics for the moment including torsemide he takes at home. 3. Reassess fluid vs diuretics needs in AM 4. Adult wheeze protocol 6. Hyponatremia, hyperkalemia - 1. Holding home PO potassium 2. Repeat BMP in AM 3. Strict intake and output 7. Bullous pemphigus 1. Not taking PO meds past couple of days due to intractable N/V 2. Getting stress dose steroids as above. 8. HTN - holding metoprolol and amlodipine for the moment 9. DM2 - 1. Will reduce lantus to 40u QHS given poor PO intake 2. Mod scale SSI Q4H 3. Hold linagliptin. 10. OSA - 1. Doesn't tolerate CPAP he says  DVT prophylaxis: Xarelto Code Status: DNR Family Communication: No family in room Disposition Plan: SNF Consults called: None Admission status: Admit to inpatient - The correct level of care for this patient is inpatient for following: 1) intractable N/V unable to tolerate POs 2) 2 Active infections with h/o MDROs requiring empiric IV ABx treatment 3) Concern for acute adrenal insufficiency due to being unable to keep his chronic PO prednisone down, requiring IV steroids instead   Etta Quill DO [facility] Pager [phone] Only works nights!  If 7AM-7PM, please contact the primary day team physician taking care of patient  [email or web address] Password [email or web address]  05/29/2018, 9:26 PM

## 2018-05-29 NOTE — ED Notes (Signed)
3 unsuccessful IV attempts by this RN.  Charge RN to attempt.

## 2018-05-30 ENCOUNTER — Encounter (HOSPITAL_COMMUNITY): Payer: Self-pay

## 2018-05-30 DIAGNOSIS — Z86711 Personal history of pulmonary embolism: Secondary | ICD-10-CM

## 2018-05-30 DIAGNOSIS — N390 Urinary tract infection, site not specified: Secondary | ICD-10-CM

## 2018-05-30 DIAGNOSIS — E1165 Type 2 diabetes mellitus with hyperglycemia: Secondary | ICD-10-CM

## 2018-05-30 DIAGNOSIS — R112 Nausea with vomiting, unspecified: Secondary | ICD-10-CM

## 2018-05-30 DIAGNOSIS — L109 Pemphigus, unspecified: Secondary | ICD-10-CM

## 2018-05-30 DIAGNOSIS — Z8614 Personal history of Methicillin resistant Staphylococcus aureus infection: Secondary | ICD-10-CM

## 2018-05-30 DIAGNOSIS — E2740 Unspecified adrenocortical insufficiency: Secondary | ICD-10-CM

## 2018-05-30 DIAGNOSIS — L89151 Pressure ulcer of sacral region, stage 1: Secondary | ICD-10-CM

## 2018-05-30 DIAGNOSIS — E1143 Type 2 diabetes mellitus with diabetic autonomic (poly)neuropathy: Secondary | ICD-10-CM

## 2018-05-30 DIAGNOSIS — T83511A Infection and inflammatory reaction due to indwelling urethral catheter, initial encounter: Secondary | ICD-10-CM

## 2018-05-30 DIAGNOSIS — Z8619 Personal history of other infectious and parasitic diseases: Secondary | ICD-10-CM

## 2018-05-30 LAB — MRSA PCR SCREENING: MRSA BY PCR: NEGATIVE

## 2018-05-30 LAB — BASIC METABOLIC PANEL
ANION GAP: 9 (ref 5–15)
BUN: 9 mg/dL (ref 8–23)
CHLORIDE: 100 mmol/L (ref 98–111)
CO2: 26 mmol/L (ref 22–32)
Calcium: 8.4 mg/dL — ABNORMAL LOW (ref 8.9–10.3)
Creatinine, Ser: 0.96 mg/dL (ref 0.61–1.24)
GFR calc Af Amer: >60 mL/min (ref >60)
GLUCOSE: 203 mg/dL — AB (ref 70–99)
POTASSIUM: 5.1 mmol/L (ref 3.5–5.1)
Sodium: 135 mmol/L (ref 135–145)

## 2018-05-30 LAB — GLUCOSE, CAPILLARY
GLUCOSE-CAPILLARY: 196 mg/dL — AB (ref 70–99)
Glucose-Capillary: 176 mg/dL — ABNORMAL HIGH (ref 70–99)
Glucose-Capillary: 190 mg/dL — ABNORMAL HIGH (ref 70–99)
Glucose-Capillary: 200 mg/dL — ABNORMAL HIGH (ref 70–99)
Glucose-Capillary: 168 mg/dL — ABNORMAL HIGH (ref 70–99)

## 2018-05-30 LAB — CORTISOL: CORTISOL PLASMA: 11.9 ug/dL

## 2018-05-30 LAB — HIV ANTIBODY (ROUTINE TESTING W REFLEX): HIV SCREEN 4TH GENERATION: NONREACTIVE

## 2018-05-30 MED ORDER — SODIUM CHLORIDE 0.9 % IV SOLN: 1.0000 g | Freq: Three times a day (TID) | INTRAVENOUS | 0 refills | Status: AC

## 2018-05-30 MED ORDER — SODIUM POLYSTYRENE SULFONATE 15 GM/60ML PO SUSP
30.0000 g | Freq: Once | ORAL | Status: AC
  Administered 2018-05-30: 30 g via ORAL
  Filled 2018-05-30: qty 120

## 2018-05-30 MED ORDER — PREDNISONE 10 MG PO TABS
10.0000 mg | ORAL_TABLET | Freq: Every day | ORAL | Status: DC
  Administered 2018-05-31: 10 mg via ORAL
  Filled 2018-05-30: qty 1

## 2018-05-30 MED ORDER — VANCOMYCIN HCL 10 G IV SOLR: 1250.0000 mg | Freq: Two times a day (BID) | INTRAVENOUS | Status: DC

## 2018-05-30 MED ORDER — MUPIROCIN CALCIUM 2 % EX CREA
TOPICAL_CREAM | Freq: Every day | CUTANEOUS | Status: DC
  Administered 2018-05-30 – 2018-05-31 (×2): via TOPICAL
  Filled 2018-05-30: qty 15

## 2018-05-30 NOTE — NC FL2 (Addendum)
[place] LEVEL OF CARE SCREENING TOOL     IDENTIFICATION  Patient Name: [name] Birthdate: 1946-05-07 Sex: male Admission Date (Current Location): 05/29/2018  [facility] and [place] Number:  Herbalist and Address:  [facility],  [place] [address], [place]      Provider Number: [id number]  Attending Physician Name and Address:  Damita Lack, MD  Relative Name and Phone Number:       Current Level of Care: Hospital Recommended Level of Care: [place] Prior Approval Number:    Date Approved/Denied:   PASRR Number: [id number]  Discharge Plan: SNF    Current Diagnoses: Patient Active Problem List   Diagnosis Date Noted  . Intractable nausea and vomiting 05/29/2018  . Sacral decubitus ulcer 05/29/2018  . History of pulmonary embolism 05/29/2018  . History of ESBL E. coli infection 05/29/2018  . History of MRSA infection 05/29/2018  . Acute adrenal insufficiency ([place]) 05/29/2018  . Cellulitis of buttock 05/29/2018  . Boil 09/08/2017  . Trigger finger 09/08/2017  . Acute pulmonary embolism ([place]) 09/07/2017  . Pressure injury of skin 09/06/2017  . Chest pain 09/03/2017  . Hyperkalemia 09/03/2017  . Allergic rhinitis 08/19/2017  . Anemia 06/25/2017  . Cellulitis of left hip 05/27/2017  . Acute cystitis without hematuria   . Dyslipidemia associated with type 2 diabetes mellitus ([place]) 01/17/2017  . 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 Hypertensive heart disease with CHF (congestive heart failure) ([place]) 04/15/2016  . H/O: CVA (cerebrovascular accident) 01/22/2016  . Sepsis ([place]) 01/11/2016  . Depression   . Syncope 11/01/2015  . GERD (gastroesophageal reflux disease) 10/22/2015  . Restless legs 10/22/2015  . CAD in native artery 09/15/2015  . Morbid obesity ([place]) 07/03/2015  . 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 Hyperlipidemia LDL goal <70 05/02/2015   . Neurogenic bladder 04/26/2015  . Catheter-associated urinary tract infection ([place]) 04/14/2015  . Abdominal pain 06/25/2014  . Chronic diastolic CHF (congestive heart failure) ([place]) 04/01/2014  . Acute on chronic diastolic CHF (congestive heart failure) ([place]) 03/21/2014  . Chronic indwelling Foley catheter 12/23/2013  . Hypokalemia 03/11/2012  . Diabetic neuropathy ([place]) 10/10/2011  . OSA (obstructive sleep apnea) 10/10/2011    Orientation RESPIRATION BLADDER Height & Weight     Self, Time, Situation, Place  O2(2L) Incontinent, Indwelling catheter Weight: (!) 382 lb 8 oz (173.5 kg) Height:  6\' 2"  (188 cm)  BEHAVIORAL SYMPTOMS/MOOD NEUROLOGICAL BOWEL NUTRITION STATUS      Continent Diet(full liquid diet- see DC summary for updated diet)  AMBULATORY STATUS COMMUNICATION OF NEEDS Skin   Extensive Assist Verbally Normal                       Personal Care Assistance Level of Assistance  Bathing, Feeding, Dressing Bathing Assistance: Maximum assistance Feeding assistance: Independent       Functional Limitations Info  Sight, Hearing, Speech Sight Info: Adequate Hearing Info: Adequate Speech Info: Adequate    SPECIAL CARE FACTORS FREQUENCY                       Contractures Contractures Info: Not present    Additional Factors Info  Code Status, Allergies, Isolation Precautions Code Status Info: DNR Allergies Info: Ace Inhibitors, Lipitor Atorvastatin Calcium, Metformin And Related, Morphine And Related, Robaxin Methocarbamol     Isolation Precautions Info: contact precautions MDRO     Current Medications (  05/30/2018):  This is the current hospital active medication list Current Facility-Administered Medications  Medication Dose Route Frequency Provider Last Rate Last Dose  . acetaminophen (TYLENOL) tablet 650 mg  650 mg Oral Q6H PRN Amin, Ankit Chirag, MD       Or  . acetaminophen (TYLENOL) suppository 650 mg  650 mg Rectal Q6H PRN Amin, Ankit Chirag, MD       . alum & mag hydroxide-simeth (MAALOX/MYLANTA) 200-200-20 MG/5ML suspension 30 mL  30 mL Oral Q6H PRN Amin, Ankit Chirag, MD      . bisacodyl (DULCOLAX) suppository 10 mg  10 mg Rectal Daily PRN Amin, Ankit Chirag, MD      . cholecalciferol (VITAMIN D) tablet 1,000 Units  1,000 Units Oral Daily Damita Lack, MD   1,000 Units at 05/30/18 0936  . diclofenac sodium (VOLTAREN) 1 % transdermal gel 2 g  2 g Topical TID Damita Lack, MD   2 g at 05/30/18 0937  . escitalopram (LEXAPRO) tablet 5 mg  5 mg Oral Daily Amin, Ankit Chirag, MD   5 mg at 05/30/18 0937  . ezetimibe (ZETIA) tablet 10 mg  10 mg Oral Daily Amin, Ankit Chirag, MD   10 mg at 05/30/18 0937  . folic acid (FOLVITE) tablet 1 mg  1 mg Oral Daily Amin, Ankit Chirag, MD   1 mg at 05/30/18 0938  . gabapentin (NEURONTIN) capsule 200 mg  200 mg Oral BID Amin, Ankit Chirag, MD   200 mg at 05/30/18 0938  . guaiFENesin (MUCINEX) 12 hr tablet 600 mg  600 mg Oral BID PRN Amin, Ankit Chirag, MD      . hydrOXYzine (ATARAX/VISTARIL) tablet 25 mg  25 mg Oral TID Damita Lack, MD   25 mg at 05/30/18 1829  . insulin aspart (novoLOG) injection 0-15 Units  0-15 Units Subcutaneous Q4H Amin, Jeanella Flattery, MD   3 Units at 05/30/18 1240  . insulin glargine (LANTUS) injection 40 Units  40 Units Subcutaneous QHS Amin, Ankit Chirag, MD   40 Units at 05/30/18 0105  . ipratropium-albuterol (DUONEB) 0.5-2.5 (3) MG/3ML nebulizer solution 3 mL  3 mL Nebulization Q6H PRN Amin, Ankit Chirag, MD      . meropenem (MERREM) 1 g in sodium chloride 0.9 % 100 mL IVPB  1 g Intravenous Q8H Amin, Ankit Chirag, MD 200 mL/hr at 05/30/18 0939 1 g at 05/30/18 0939  . multivitamin with minerals tablet 1 tablet  1 tablet Oral Daily Amin, Ankit Chirag, MD      . mupirocin cream (BACTROBAN) 2 %   Topical Daily Amin, Ankit Chirag, MD      . ondansetron (ZOFRAN) tablet 4 mg  4 mg Oral Q6H PRN Amin, Ankit Chirag, MD       Or  . ondansetron (ZOFRAN) injection 4 mg  4 mg  Intravenous Q6H PRN Amin, Ankit Chirag, MD      . pantoprazole (PROTONIX) EC tablet 40 mg  40 mg Oral Daily Amin, Ankit Chirag, MD   40 mg at 05/30/18 0939  . polyethylene glycol (MIRALAX / GLYCOLAX) packet 17 g  17 g Oral Daily PRN Amin, Ankit Chirag, MD      . predniSONE (DELTASONE) tablet 10 mg  10 mg Oral Daily Amin, Ankit Chirag, MD      . promethazine (PHENERGAN) injection 12.5 mg  12.5 mg Intravenous Q6H PRN Amin, Ankit Chirag, MD      . rivaroxaban (XARELTO) tablet 10 mg  10 mg Oral Daily Amin,  Jeanella Flattery, MD   10 mg at 05/30/18 0939  . rOPINIRole (REQUIP) tablet 0.5 mg  0.5 mg Oral QHS Amin, Ankit Chirag, MD      . senna-docusate (Senokot-S) tablet 2 tablet  2 tablet Oral QHS Amin, Ankit Chirag, MD      . simethicone (MYLICON) chewable tablet 120 mg  120 mg Oral Q6H PRN Amin, Ankit Chirag, MD      . traMADol (ULTRAM) tablet 100 mg  100 mg Oral TID Amin, Ankit Chirag, MD   100 mg at 05/30/18 0940  . traZODone (DESYREL) tablet 100 mg  100 mg Oral QHS Amin, Ankit Chirag, MD   100 mg at 05/30/18 0102     Discharge Medications: acetaminophen 500 MG tablet Commonly known as:  TYLENOL Take 1,000 mg by mouth every 8 (eight) hours as needed.   aluminum-magnesium hydroxide-simethicone 846-962-95 MG/5ML Susp Commonly known as:  MAALOX Take 30 mLs by mouth every 6 (six) hours as needed (indigestion).   bisacodyl 10 MG suppository Commonly known as:  DULCOLAX Place 10 mg rectally daily as needed for moderate constipation.   Cholecalciferol 1000 units tablet Take 1,000 Units by mouth daily.   diclofenac sodium 1 % Gel Commonly known as:  VOLTAREN Apply topically 3 (three) times daily. Apply to left shoulder topically three times daily   escitalopram 5 MG tablet Commonly known as:  LEXAPRO Take 5 mg by mouth daily.   ezetimibe 10 MG tablet Commonly known as:  ZETIA Take 10 mg by mouth daily.   folic acid 1 MG tablet Commonly known as:  FOLVITE Take 1 mg by mouth  daily.   gabapentin 100 MG capsule Commonly known as:  NEURONTIN Take 200 mg by mouth 2 (two) times daily.   guaiFENesin 600 MG 12 hr tablet Commonly known as:  MUCINEX Take 600 mg by mouth 2 (two) times daily as needed for cough.   HUMALOG 100 UNIT/ML injection Generic drug:  insulin lispro Inject 25 Units into the skin 3 (three) times daily with meals. Also inject 5 units subcutaneously every 8 hours as needed for CBG greater than 300   hydrOXYzine 25 MG tablet Commonly known as:  ATARAX/VISTARIL Take 25 mg by mouth 3 (three) times daily.   ipratropium-albuterol 0.5-2.5 (3) MG/3ML Soln Commonly known as:  DUONEB Take 3 mLs by nebulization every 6 (six) hours as needed (sob).   linagliptin 5 MG Tabs tablet Commonly known as:  TRADJENTA Take 5 mg by mouth daily.   meropenem 1 g in sodium chloride 0.9 % 100 mL Inject 1 g into the vein every 8 (eight) hours for 4 days.   methotrexate 10 MG tablet Commonly known as:  RHEUMATREX Take 10 mg by mouth every Wednesday. Caution: Chemotherapy. Protect from light.   metoprolol succinate 25 MG 24 hr tablet Commonly known as:  TOPROL-XL Take 25 mg by mouth daily.   multivitamin tablet Take 1 tablet by mouth daily.   nitroGLYCERIN 0.4 MG SL tablet Commonly known as:  NITROSTAT Place 0.4 mg under the tongue every 5 (five) minutes as needed for chest pain.   NORVASC 5 MG tablet Generic drug:  amLODipine Take 5 mg by mouth daily.   omeprazole 20 MG capsule Commonly known as:  PRILOSEC Take 20 mg by mouth daily.   ondansetron 4 MG tablet Commonly known as:  ZOFRAN Take 4 mg by mouth every 6 (six) hours as needed for nausea or vomiting.   OXYGEN Inhale into the lungs. O2 at 2l/min  via nasal canula as needed for shortness of breath   polyethylene glycol packet Commonly known as:  MIRALAX / GLYCOLAX Take 17 g by mouth daily as needed for mild constipation or moderate constipation.   potassium chloride 10 MEQ  tablet Commonly known as:  K-DUR Take 10 mEq by mouth 4 (four) times daily.     Relevant Imaging Results:  Relevant Lab Results:   Additional Information SS#: [id number]  Nila Nephew, LCSW

## 2018-05-30 NOTE — Progress Notes (Signed)
Pt offered a low air loss mattress to promote healing of wounds. Pt refused bed due to the noise it makes. Will continue to monitor

## 2018-05-30 NOTE — Progress Notes (Signed)
Pt admitted from [facility] where is a long term care resident, plan to return at DC. Notified facility of need for IV antibiotics to continue at DC (4 days TID). Per facility, peripheral IV adequate, no need for PICC. Completed FL2 and will provide script for antibiotics to facility representative today to facilitate medication being available at facility should pt DC over weekend (limited pharmacy hours).  Sharren Bridge, MSW, LCSW Clinical Social Work 05/30/2018 [phone] coverage for [phone]

## 2018-05-30 NOTE — Consult Note (Signed)
[facility] Nurse wound consult note Reason for Consult: Consult requested for right foot and buttocks/sacrum Wound type: Right outer ankle with full thickness wound; 2X2X.2cm, dry pale red woundbed, small amt yellow drainage, no odor Bilat buttocks are red, macerated, with patchy areas of partial thickness skin loss.  Appearance is consistent with moisture associated skin damage.  Pt is frequently incontinent of urine. Affected area is approx 40X40cm and spans bilat buttocks, gluteal fold, and sacrum areas Dressing procedure/placement/frequency:Barrier cream and antifungal powder to protect and promote drying and healing to buttocks/sacrum. Air mattress to reduce pressure to the affected areas. Bactroban to right ankle to promote moist healing. No family present to discuss plan of care. Please re-consult if further assistance is needed.  Thank-you,  Julien Girt MSN, [place], [place], [place], [place]

## 2018-05-30 NOTE — Progress Notes (Signed)
PROGRESS NOTE    [name]  DGL:[id number] DOB: 1946/04/05 DOA: 05/29/2018 PCP: Gildardo Cranker, DO   Brief Narrative:  72 year old with past medical history of morbid obesity, bullous pemphigus on chronic steroids and methotrexate, chronic indwelling Foley, diabetes mellitus type 2, essential hypertension, PE on chronic Xarelto came to the hospital with complaints of intractable nausea and vomiting.  He also reports he has had Foley catheter for several months but has not been changed in last 2 months.  He was found to have urinary tract infection with signs of slight volume overload.  CT of the abdomen pelvis was negative for any acute pathology.  Given his history of ESBL he was started on meropenem.  There is also concerns of sacral decubitus area cellulitis therefore started on vancomycin.   Assessment & Plan:   Principal Problem:   Intractable nausea and vomiting Active Problems:   OSA (obstructive sleep apnea)   Chronic indwelling Foley catheter   Acute on chronic diastolic CHF (congestive heart failure) (HCC)   Catheter-associated urinary tract infection (HCC)   Uncontrolled type II diabetes with peripheral autonomic neuropathy (HCC)   Bullous pemphigus   Hyperkalemia   Sacral decubitus ulcer   History of pulmonary embolism   History of ESBL E. coli infection   History of MRSA infection   Acute adrenal insufficiency (HCC)   Cellulitis of buttock  Urinary tract infection without hematuria -Foley catheter has been changed at the time of admission.  Given history of ESBL, continue meropenem.  Await urine cultures.  Spoke with infectious disease, plans to treat with meropenem for 5 to 7 days.  Sacral area decubitus pressure ulcer, stage I - No obvious evidence of cellulitis, I think the erythema is related to pressure.  At this time I will discontinue vancomycin and monitor him clinically.  Wound care has been consulted.  Appreciate input from Dr. Megan Salon over the  phone.  Abdominal pain and nausea, improved -Currently on clear liquid diet, will advance as tolerated.  History of bullous pemphigus - Due to him being chronically on steroids there was initial concerns for adrenal insufficiency.  Will discontinue his hydrocortisone and start the patient on home regimen of prednisone 10mg  po daily  Diabetes mellitus type 2, insulin-dependent Morbid obesity Peripheral neuropathy secondary to diabetes mellitus type 2 - Home Lantus has been reduced due to poor p.o. intake.  As this gets better, will increase his insulin back to his home regimen. -Continue gabapentin.  History of pulmonary embolism/DVT -Continue Xarelto.  Unsure why he is only on 10 mg p.o. daily?.  Will defer this outpatient as I do not have all the information available.  Hyperlipidemia -Continue Zetia  History of anxiety/depression -Continue Lexapro  Restless leg syndrome -Continue ropinirole daily.  DVT prophylaxis: On Xarelto Code Status: DNR as per documented by admitting physician Family Communication: None at bedside Disposition Plan: Maintain inpatient stay for another 24-48 hours until more culture data is available.  We will discharge him back to his facility thereafter.  Consultants:   Curbside infectious disease, Dr. Megan Salon  Procedures:   None  Antimicrobials:   Vancomycin 9/19 > 9/20  Meropenem 9/19 >   Subjective: Feels a little better.  No complaints to me.  States he is not able to move around in his bed much given his body habitus therefore offered him bariatric bed but he is refusing it as it "makes lots of noises".   Review of Systems Otherwise negative except as per HPI, including: General:  Denies fever, chills, night sweats or unintended weight loss. Resp: Denies cough, wheezing, shortness of breath. Cardiac: Denies chest pain, palpitations, orthopnea, paroxysmal nocturnal dyspnea. GI: Denies abdominal pain, nausea, vomiting, diarrhea or  constipation GU: Denies dysuria, frequency, hesitancy or incontinence MS: Denies muscle aches, joint pain or swelling Neuro: Denies headache, neurologic deficits (focal weakness, numbness, tingling), abnormal gait Psych: Denies anxiety, depression, SI/HI/AVH Skin: Denies new rashes or lesions ID: Denies sick contacts, exotic exposures, travel  Objective: Vitals:   05/29/18 1638 05/29/18 1748 05/29/18 2318 05/30/18 0441  BP: (!) 145/77 127/62 98/67 134/84  Pulse: (!) 102 97 (!) 101 99  Resp: 20 15 13  (!) 22  Temp:   98.1 F (36.7 C) 97.7 F (36.5 C)  TempSrc:   Oral Oral  SpO2: 100% 97% 100% 99%  Weight:   (!) 173.5 kg   Height:   6\' 2"  (1.88 m)     Intake/Output Summary (Last 24 hours) at 05/30/2018 1217 Last data filed at 05/30/2018 0743 Gross per 24 hour  Intake 118.48 ml  Output 950 ml  Net -831.52 ml   Filed Weights   05/29/18 2318  Weight: (!) 173.5 kg    Examination:  General exam: Appears calm and comfortable, morbidly obese Respiratory system: Clear to auscultation. Respiratory effort normal. Cardiovascular system: S1 & S2 heard, RRR. No JVD, murmurs, rubs, gallops or clicks. No pedal edema. Gastrointestinal system: Abdomen is nondistended, soft and nontender. No organomegaly or masses felt. Normal bowel sounds heard. Central nervous system: Alert and oriented. No focal neurological deficits. Extremities: Symmetric 5 x 5 power. Skin: Has erythematous region in his sacral region likely from pressure ulcer which does not necessarily appear to be infected.  It appears to be in stage I. Psychiatry: Judgement and insight appear normal. Mood & affect appropriate.   Foley catheter in place, exchanged on 9/19  Data Reviewed:   CBC: Recent Labs  Lab 05/29/18 1559  WBC 10.0  HGB 9.6*  HCT 30.0*  MCV 92.3  PLT 209   Basic Metabolic Panel: Recent Labs  Lab 05/29/18 1559 05/30/18 0541  NA 131* 135  K 5.7* 5.1  CL 96* 100  CO2 26 26  GLUCOSE 234* 203*   BUN 10 9  CREATININE 1.06 0.96  CALCIUM 8.5* 8.4*   GFR: Estimated Creatinine Clearance: 120.2 mL/min (by C-G formula based on SCr of 0.96 mg/dL). Liver Function Tests: Recent Labs  Lab 05/29/18 1559  AST 17  ALT 15  ALKPHOS 147*  BILITOT 0.5  PROT 6.1*  ALBUMIN 2.6*   Recent Labs  Lab 05/29/18 1559  LIPASE 20   No results for input(s): AMMONIA in the last 168 hours. Coagulation Profile: No results for input(s): INR, PROTIME in the last 168 hours. Cardiac Enzymes: Recent Labs  Lab 05/29/18 1558  TROPONINI <0.03   BNP (last 3 results) No results for input(s): PROBNP in the last 8760 hours. HbA1C: No results for input(s): HGBA1C in the last 72 hours. CBG: Recent Labs  Lab 05/29/18 2328 05/30/18 0438 05/30/18 0727  GLUCAP 230* 176* 190*   Lipid Profile: No results for input(s): CHOL, HDL, LDLCALC, TRIG, CHOLHDL, LDLDIRECT in the last 72 hours. Thyroid Function Tests: No results for input(s): TSH, T4TOTAL, FREET4, T3FREE, THYROIDAB in the last 72 hours. Anemia Panel: No results for input(s): VITAMINB12, FOLATE, FERRITIN, TIBC, IRON, RETICCTPCT in the last 72 hours. Sepsis Labs: No results for input(s): PROCALCITON, LATICACIDVEN in the last 168 hours.  No results found for this or any  previous visit (from the past 240 hour(s)).       Radiology Studies: Dg Chest 2 View  Result Date: 05/29/2018 CLINICAL DATA:  Nausea, vomiting for 2 days, some shortness of breath EXAM: CHEST - 2 VIEW COMPARISON:  Chest x-ray of 09/05/2017 FINDINGS: The lungs are poorly aerated. However there is cardiomegaly present with moderate pulmonary vascular congestion. No pleural effusion is seen with certainty. No bony abnormality is noted. IMPRESSION: Cardiomegaly and probable moderate pulmonary vascular congestion. Electronically Signed   By: Ivar Drape M.D.   On: 05/29/2018 16:29   Ct Abdomen Pelvis W Contrast  Result Date: 05/29/2018 CLINICAL DATA:  Nausea and vomiting. EXAM:  CT ABDOMEN AND PELVIS WITH CONTRAST TECHNIQUE: Multidetector CT imaging of the abdomen and pelvis was performed using the standard protocol following bolus administration of intravenous contrast. CONTRAST:  17mL ISOVUE-300 IOPAMIDOL (ISOVUE-300) INJECTION 61% COMPARISON:  05/27/2017 FINDINGS: Lower chest: Coronary artery calcification is evident. Bibasilar collapse/consolidation noted, left greater than right. Small bilateral pleural effusions are evident. Hepatobiliary: No focal abnormality within the liver parenchyma. Gallbladder surgically absent. No intrahepatic or extrahepatic biliary dilation. Pancreas: No focal mass lesion. No dilatation of the main duct. No intraparenchymal cyst. No peripancreatic edema. Spleen: No splenomegaly. No focal mass lesion. Adrenals/Urinary Tract: Right adrenal gland unremarkable. 2.1 cm myelolipoma in the left adrenal gland is stable. 14 mm low-density lesion upper pole right kidney is stable. Left kidney unremarkable. No evidence for hydroureter. Bladder is decompressed. Stomach/Bowel: Surgical clips noted in the region of the gastric cardia with staple line noted proximal stomach Duodenum is normally positioned as is the ligament of Treitz. Small bowel is decompressed. The terminal ileum is normal. A The appendix is normal. No gross colonic mass. No colonic wall thickening. No substantial diverticular change. Vascular/Lymphatic: There is abdominal aortic atherosclerosis without aneurysm. There is no gastrohepatic or hepatoduodenal ligament lymphadenopathy. No intraperitoneal or retroperitoneal lymphadenopathy. No pelvic sidewall lymphadenopathy. Reproductive: The prostate gland and seminal vesicles have normal imaging features.Foley catheter balloon is inflated in the posterior urethra. Other: No intraperitoneal free fluid. Musculoskeletal: Degenerative changes noted right hip. Bones are diffusely demineralized. Anterior wedge compression deformity at T11 is stable.  Degenerative changes noted at L4-5. Bilateral gynecomastia. IMPRESSION: 1. No acute findings in the abdomen or pelvis. Specifically, no findings to explain the history of nausea and vomiting. 2. Balloon for the Foley catheter is inflated in the posterior urethra. 3. 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 4.  Aortic Atherosclerois (ICD10-170.0) Electronically Signed   By: Misty Stanley M.D.   On: 05/29/2018 17:57        Scheduled Meds: . cholecalciferol  1,000 Units Oral Daily  . diclofenac sodium  2 g Topical TID  . escitalopram  5 mg Oral Daily  . ezetimibe  10 mg Oral Daily  . folic acid  1 mg Oral Daily  . gabapentin  200 mg Oral BID  . hydrocortisone sod succinate (SOLU-CORTEF) inj  50 mg Intravenous Q8H  . hydrOXYzine  25 mg Oral TID  . insulin aspart  0-15 Units Subcutaneous Q4H  . insulin glargine  40 Units Subcutaneous QHS  . multivitamin with minerals  1 tablet Oral Daily  . mupirocin cream   Topical Daily  . pantoprazole  40 mg Oral Daily  . rivaroxaban  10 mg Oral Daily  . rOPINIRole  0.5 mg Oral QHS  . senna-docusate  2 tablet Oral QHS  . traMADol  100 mg Oral TID  . traZODone  100 mg Oral QHS  Continuous Infusions: . meropenem (MERREM) IV 1 g (05/30/18 0939)     LOS: 1 day   Time spent= 35 mins    Ankit Arsenio Loader, MD [facility] Pager [phone]   If 7PM-7AM, please contact night-coverage [email or web address] Password [facility] 05/30/2018, 12:17 PM

## 2018-05-30 NOTE — Progress Notes (Signed)
Wound care carried out per [facility] nurse order.

## 2018-05-30 NOTE — Progress Notes (Signed)
Pharmacy Antibiotic Note  [name] is a 72 y.o. male with PMH ESBL UTI and MRSA infection admitted on 05/29/2018 with UTI and cellulitis. Pharmacy has been consulted for vancomycin and meropenem dosing.  Plan:  Change vancomycin to 1250 mg IV q12h as per normalized CrCl of 72 and wt > 140 kg  Measure vancomycin AUC at steady state as indicated  Continue meropenem 1g IV q8 hr  SCr q48 hr while on vancomycin   Height: 6\' 2"  (188 cm) Weight: (!) 382 lb 8 oz (173.5 kg) IBW/kg (Calculated) : 82.2  Temp (24hrs), Avg:98 F (36.7 C), Min:97.7 F (36.5 C), Max:98.3 F (36.8 C)  Recent Labs  Lab 05/29/18 1559 05/30/18 0541  WBC 10.0  --   CREATININE 1.06 0.96    Estimated Creatinine Clearance: 120.2 mL/min (by C-G formula based on SCr of 0.96 mg/dL).    Allergies  Allergen Reactions  . Ace Inhibitors Swelling    Pt tolerates lisinopril  . Lipitor [Atorvastatin Calcium] Swelling  . Metformin And Related Swelling  . Morphine And Related Other (See Comments)    Sweating, feels like is "in rocky boat."  . Robaxin [Methocarbamol] Other (See Comments)    Feels like he is shaky      Thank you for allowing pharmacy to be a part of this patient's care.  Royetta Asal, PharmD, BCPS Pager [phone] 05/30/2018 10:00 AM

## 2018-05-30 NOTE — Progress Notes (Signed)
Upon arrival to unit pt provided with bariatric bed however, refused for staff to transfer him to bed. Pt states, "I hate the noise that bed makes and if you pull me over I will punch you all and call the cops". Per pt's adamant request he is provided with a medsurg bed at this time.

## 2018-05-31 DIAGNOSIS — G4733 Obstructive sleep apnea (adult) (pediatric): Secondary | ICD-10-CM

## 2018-05-31 DIAGNOSIS — Z960 Presence of urogenital implants: Secondary | ICD-10-CM

## 2018-05-31 LAB — CBC
HEMATOCRIT: 31.6 % — AB (ref 39.0–52.0)
HEMOGLOBIN: 9.9 g/dL — AB (ref 13.0–17.0)
MCH: 29.3 pg (ref 26.0–34.0)
MCHC: 31.3 g/dL (ref 30.0–36.0)
MCV: 93.5 fL (ref 78.0–100.0)
Platelets: 315 10*3/uL (ref 150–400)
RBC: 3.38 MIL/uL — AB (ref 4.22–5.81)
RDW: 15.3 % (ref 11.5–15.5)
WBC: 9.7 10*3/uL (ref 4.0–10.5)

## 2018-05-31 LAB — BASIC METABOLIC PANEL
Anion gap: 8 (ref 5–15)
BUN: 11 mg/dL (ref 8–23)
CALCIUM: 8.5 mg/dL — AB (ref 8.9–10.3)
CHLORIDE: 101 mmol/L (ref 98–111)
CO2: 28 mmol/L (ref 22–32)
CREATININE: 1.13 mg/dL (ref 0.61–1.24)
GFR calc Af Amer: >60 mL/min (ref >60)
GFR calc non Af Amer: >60 mL/min (ref >60)
Glucose, Bld: 104 mg/dL — ABNORMAL HIGH (ref 70–99)
Potassium: 4.4 mmol/L (ref 3.5–5.1)
SODIUM: 137 mmol/L (ref 135–145)

## 2018-05-31 LAB — BRAIN NATRIURETIC PEPTIDE: B Natriuretic Peptide: 516.3 pg/mL — ABNORMAL HIGH (ref 0.0–100.0)

## 2018-05-31 LAB — GLUCOSE, CAPILLARY
GLUCOSE-CAPILLARY: 98 mg/dL (ref 70–99)
GLUCOSE-CAPILLARY: 106 mg/dL — AB (ref 70–99)
GLUCOSE-CAPILLARY: 228 mg/dL — AB (ref 70–99)
Glucose-Capillary: 131 mg/dL — ABNORMAL HIGH (ref 70–99)

## 2018-05-31 LAB — MAGNESIUM: MAGNESIUM: 1.6 mg/dL — AB (ref 1.7–2.4)

## 2018-05-31 NOTE — Progress Notes (Signed)
Report called to [place] at [facility].  Per Inna, patient to be discharged with PIV.  All questions answered.   MD notified regarding PIV.  AVS printed and placed in discharge packet.

## 2018-05-31 NOTE — Discharge Summary (Signed)
Physician Discharge Summary  Luismanuel Corman Greff EGB:[id number] DOB: [birth year] DOA: 05/29/2018  PCP: Gildardo Cranker, DO  Admit date: 05/29/2018 Discharge date: 05/31/2018  [facility] Disposition: [facility] skilled nursing facility  Recommendations for Outpatient Follow-up:  1. Follow up with PCP in 1-2 weeks 2. Please obtain BMP/CBC in one week your next doctors visit.  3. Follow up with Outpatient Urology in 1-2 weeks for his Foley care 4. IV meropenem ordered 1 g every 8 hours for next 4 days.   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  He also reports he has had Foley catheter for several months but has not been changed in last 2 months.  He was found to have urinary tract infection with signs of slight volume overload.  CT of the abdomen pelvis was negative for any acute pathology.  Given his history of ESBL he was started on meropenem.  There is also concerns of sacral decubitus area cellulitis therefore started on vancomycin. Patient started doing much better the following day.  After discussion with Dr. Megan Salon from infectious disease it was determined to discontinue IV vancomycin and continuing meropenem for 4 more days at the time of discharge.  At this time patient has reached maximum benefit from an hospital stay and stable to be discharged with outpatient follow-up recommendations as stated above.  His Foley catheter was changed during this hospitalization.  He would benefit from outpatient urology follow-up in 2 weeks as well.  We will discharge the patient in stable condition  today.   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 coli infection   History of MRSA infection   Acute adrenal insufficiency (HCC)   Cellulitis of buttock   Urinary tract infection without hematuria, stable -Foley catheter has been changed at the time of admission.  Given history of ESBL, continue meropenem.    Spoke with Dr. Megan Salon from infectious disease who recommends 4 more days of IV antibiotic treatment for total of about 5 to 7 days.  Arrangements made for this to be given at his facility. - Needs to follow-up with outpatient urology for his Foley catheter which apparently had not been changed in last 2 months prior to this admission.  Recommend following up with urology in 1 week  Sacral area decubitus pressure ulcer, stage I - No obvious evidence of cellulitis, I think the erythema is related to pressure.  At this time I will discontinue vancomycin and monitor him clinically.  Wound care has been consulted.  Appreciate input from Dr. Megan Salon over the phone.  Abdominal pain and nausea, resolved -This is currently resolved.  Advised to use antiemetics as necessary.  CT of the abdomen pelvis negative.  History of bullous pemphigus - Due to him being chronically on steroids there was initial concerns for adrenal insufficiency.  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 intake.  As this gets better, will increase his insulin back to his home regimen. -Continue gabapentin.  History of pulmonary embolism/DVT -Continue Xarelto.  Unsure why he is only on 10 mg p.o. daily?.  Will defer this outpatient as I do not have all the information available.  Hyperlipidemia -Continue Zetia  History of anxiety/depression -Continue Lexapro  Restless leg syndrome -Continue ropinirole daily.  Xarelto as DVT prophylaxis while patient is here DNR Discharge to [facility] skilled nursing facility today.  Discharge Instructions   Allergies as of 05/31/2018      Reactions   Ace Inhibitors Swelling   Pt tolerates lisinopril   Lipitor [atorvastatin Calcium] Swelling   Metformin And Related Swelling   Morphine And Related Other (See Comments)   Sweating, feels like is "in rocky boat."   Robaxin [methocarbamol] Other (See Comments)   Feels like he is shaky      Medication List    TAKE these medications   acetaminophen 500 MG tablet Commonly known as:  TYLENOL Take 1,000 mg by mouth every 8 (eight) hours as needed.   aluminum-magnesium hydroxide-simethicone 462-703-50 MG/5ML Susp Commonly known as:  MAALOX Take 30 mLs by mouth every 6 (six) hours as needed (indigestion).   bisacodyl 10 MG suppository Commonly known as:  DULCOLAX Place 10 mg rectally daily as needed for moderate constipation.   Cholecalciferol 1000 units tablet Take 1,000 Units by mouth daily.   diclofenac sodium 1 % Gel Commonly known as:  VOLTAREN Apply topically 3 (three) times daily. Apply to left shoulder topically three times daily   escitalopram 5 MG tablet Commonly known as:  LEXAPRO Take 5 mg by mouth daily.   ezetimibe 10 MG tablet Commonly known as:  ZETIA Take 10 mg by mouth daily.   folic acid 1 MG tablet Commonly known as:  FOLVITE Take 1 mg by mouth daily.   gabapentin 100 MG capsule Commonly known as:   NEURONTIN Take 200 mg by mouth 2 (two) times daily.   guaiFENesin 600 MG 12 hr tablet Commonly known as:  MUCINEX Take 600 mg by mouth 2 (two) times daily as needed for cough.   HUMALOG 100 UNIT/ML injection Generic drug:  insulin lispro Inject 25 Units into the skin 3 (three) times daily with meals. Also inject 5 units subcutaneously every 8 hours as needed for CBG greater than 300   hydrOXYzine 25 MG tablet Commonly known as:  ATARAX/VISTARIL Take 25 mg by mouth 3 (three) times daily.   ipratropium-albuterol 0.5-2.5 (3) MG/3ML Soln Commonly known as:  DUONEB Take 3 mLs by nebulization every 6 (six) hours as needed (sob).   linagliptin 5 MG Tabs tablet Commonly known as:  TRADJENTA Take 5 mg by mouth daily.   meropenem 1 g in sodium chloride 0.9 % 100 mL Inject 1 g into the vein every 8 (eight) hours for 4 days.   methotrexate 10 MG tablet Commonly known as:  RHEUMATREX Take 10 mg by mouth every Wednesday. Caution: Chemotherapy. Protect from light.   metoprolol succinate 25 MG 24 hr tablet Commonly known as:  TOPROL-XL Take 25 mg by mouth daily.   multivitamin tablet Take 1 tablet by mouth daily.   nitroGLYCERIN 0.4 MG SL tablet Commonly known as:  NITROSTAT Place 0.4 mg under the tongue every 5 (five) minutes as needed for chest pain.   NORVASC 5 MG tablet Generic drug:  amLODipine Take 5 mg by mouth daily.   omeprazole 20 MG capsule Commonly known as:  PRILOSEC Take 20 mg by mouth daily.   ondansetron 4 MG tablet Commonly known as:  ZOFRAN Take 4 mg by mouth every 6 (six) hours as needed for nausea or vomiting.   OXYGEN Inhale into the lungs. O2 at 2l/min via nasal canula as needed for shortness of breath   polyethylene glycol packet Commonly known as:  MIRALAX / GLYCOLAX Take 17 g by mouth daily as needed for mild constipation or moderate constipation.   potassium chloride 10 MEQ tablet Commonly known as:  K-DUR Take 10 mEq by mouth 4 (four) times  daily.   predniSONE 10 MG tablet Commonly known as:  DELTASONE Take 10 mg by mouth daily.   rivaroxaban 10 MG Tabs tablet Commonly known as:  XARELTO Take 10 mg by mouth daily.   rOPINIRole 0.5 MG tablet Commonly known as:  REQUIP Take 0.5 mg by mouth at bedtime. 2100   sennosides-docusate sodium 8.6-50 MG tablet Commonly known as:  SENOKOT-S Take 2 tablets by mouth at bedtime. 2100   simethicone 125 MG chewable tablet Commonly known as:  MYLICON Chew 400 mg by mouth every 6 (six) hours as needed for flatulence.   torsemide 20 MG tablet Commonly known as:  DEMADEX Take 20 mg by mouth daily.   TOUJEO SOLOSTAR 300 UNIT/ML Sopn Generic drug:  Insulin Glargine Inject 85 Units into the skin at bedtime.   traMADol 50 MG tablet Commonly known as:  ULTRAM Take 2 tablets (100 mg total) by mouth 3 (three) times daily. Hold if Lethargic or confused   traZODone 100 MG tablet Commonly known as:  DESYREL Take 100 mg by mouth at bedtime.   triamcinolone cream 0.1 % Commonly known as:  KENALOG Apply 1 application topically 2 (two) times daily.   vitamin C 500 MG tablet Commonly known as:  ASCORBIC ACID Take 500 mg by mouth 2 (two) times daily.       Contact information for follow-up providers    Gildardo Cranker, DO. Schedule an appointment as soon as possible for a visit in 1 week(s).   Specialty:  Internal Medicine Contact information: [place] [phone]            Contact information for after-discharge care    Destination    [facility] .   Service:  Skilled Nursing Contact information: [address] [address] [phone]                 Allergies  Allergen Reactions  . Ace Inhibitors Swelling    Pt tolerates lisinopril  . Lipitor [Atorvastatin Calcium] Swelling  . Metformin And Related Swelling  . Morphine And Related Other (See Comments)    Sweating, feels  like is "in rocky boat."  . Robaxin [Methocarbamol] Other (See Comments)    Feels like he is shaky    You were cared for by a hospitalist during your hospital stay. If you have any questions about your discharge medications or the care you received while you were in the hospital after you are discharged, you can call the unit and asked to speak with the hospitalist on call if the hospitalist that took care of you is not available. Once you are  discharged, your primary care physician will handle any further medical issues. Please note that no refills for any discharge medications will be authorized once you are discharged, as it is imperative that you return to your primary care physician (or establish a relationship with a primary care physician if you do not have one) for your aftercare needs so that they can reassess your need for medications and monitor your lab values.  Consultations:  Curbside infectious disease-Dr. Megan Salon   Procedures/Studies: Dg Chest 2 View  Result Date: 05/29/2018 CLINICAL DATA:  Nausea, vomiting for 2 days, some shortness of breath EXAM: CHEST - 2 VIEW COMPARISON:  Chest x-ray of 09/05/2017 FINDINGS: The lungs are poorly aerated. However there is cardiomegaly present with moderate pulmonary vascular congestion. No pleural effusion is seen with certainty. No bony abnormality is noted. IMPRESSION: Cardiomegaly and probable moderate pulmonary vascular congestion. Electronically Signed   By: Ivar Drape M.D.   On: 05/29/2018 16:29   Ct Abdomen Pelvis W Contrast  Result Date: 05/29/2018 CLINICAL DATA:  Nausea and vomiting. EXAM: CT ABDOMEN AND PELVIS WITH CONTRAST TECHNIQUE: Multidetector CT imaging of the abdomen and pelvis was performed using the standard protocol following bolus administration of intravenous contrast. CONTRAST:  130mL ISOVUE-300 IOPAMIDOL (ISOVUE-300) INJECTION 61% COMPARISON:  05/27/2017 FINDINGS: Lower chest: Coronary artery calcification is evident.  Bibasilar collapse/consolidation noted, left greater than right. Small bilateral pleural effusions are evident. Hepatobiliary: No focal abnormality within the liver parenchyma. Gallbladder surgically absent. No intrahepatic or extrahepatic biliary dilation. Pancreas: No focal mass lesion. No dilatation of the main duct. No intraparenchymal cyst. No peripancreatic edema. Spleen: No splenomegaly. No focal mass lesion. Adrenals/Urinary Tract: Right adrenal gland unremarkable. 2.1 cm myelolipoma in the left adrenal gland is stable. 14 mm low-density lesion upper pole right kidney is stable. Left kidney unremarkable. No evidence for hydroureter. Bladder is decompressed. Stomach/Bowel: Surgical clips noted in the region of the gastric cardia with staple line noted proximal stomach Duodenum is normally positioned as is the ligament of Treitz. Small bowel is decompressed. The terminal ileum is normal. A The appendix is normal. No gross colonic mass. No colonic wall thickening. No substantial diverticular change. Vascular/Lymphatic: There is abdominal aortic atherosclerosis without aneurysm. There is no gastrohepatic or hepatoduodenal ligament lymphadenopathy. No intraperitoneal or retroperitoneal lymphadenopathy. No pelvic sidewall lymphadenopathy. Reproductive: The prostate gland and seminal vesicles have normal imaging features.Foley catheter balloon is inflated in the posterior urethra. Other: No intraperitoneal free fluid. Musculoskeletal: Degenerative changes noted right hip. Bones are diffusely demineralized. Anterior wedge compression deformity at T11 is stable. Degenerative changes noted at L4-5. Bilateral gynecomastia. IMPRESSION: 1. No acute findings in the abdomen or pelvis. Specifically, no findings to explain the history of nausea and vomiting. 2. Balloon for the Foley catheter is inflated in the posterior urethra. 3. 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 4.  Aortic Atherosclerois (ICD10-170.0) Electronically  Signed   By: Misty Stanley M.D.   On: 05/29/2018 17:57      Subjective: No complaints, feeling better.  Remains afebrile.  Tolerating oral diet  General = no fevers, chills, dizziness, malaise, fatigue HEENT/EYES = negative for pain, redness, loss of vision, double vision, blurred vision, loss of hearing, sore throat, hoarseness, dysphagia Cardiovascular= negative for chest pain, palpitation, murmurs, lower extremity swelling Respiratory/lungs= negative for shortness of breath, cough, hemoptysis, wheezing, mucus production Gastrointestinal= negative for nausea, vomiting,, abdominal pain, melena, hematemesis Genitourinary= negative for Dysuria, Hematuria, Change in Urinary Frequency MSK = Negative for arthralgia, myalgias, Back Pain, Joint swelling  Neurology= Negative for headache, seizures, numbness, tingling  Psychiatry= Negative for anxiety, depression, suicidal and homocidal ideation Allergy/Immunology= Medication/Food allergy as listed  Skin= Negative for Rash, lesions, ulcers, itching   Discharge Exam: Vitals:   05/30/18 2027 05/31/18 0423  BP: 125/73 117/60  Pulse: 89 93  Resp: 19 20  Temp: 97.7 F (36.5 C) 97.6 F (36.4 C)  SpO2: 100% 97%   Vitals:   05/30/18 0441 05/30/18 1410 05/30/18 2027 05/31/18 0423  BP: 134/84 (!) 108/40 125/73 117/60  Pulse: 99 95 89 93  Resp: (!) 22 18 19 20   Temp: 97.7 F (36.5 C) (!) 97.5 F (36.4 C) 97.7 F (36.5 C) 97.6 F (36.4 C)  TempSrc: Oral Oral Oral Oral  SpO2: 99% 100% 100% 97%  Weight:      Height:        General: Pt is alert, awake, not in acute distress, morbidly obese Cardiovascular: RRR, S1/S2 +, no rubs, no gallops Respiratory: CTA bilaterally, no wheezing, no rhonchi Abdominal: Soft, NT, ND, bowel sounds + Extremities: no edema, no cyanosis Does have stage I sacral decubitus ulcer without any evidence of infection.  Erythema secondary to pressure.    The results of significant diagnostics from this  hospitalization (including imaging, microbiology, ancillary and laboratory) are listed below for reference.     Microbiology: Recent Results (from the past 240 hour(s))  Urine culture     Status: None (Preliminary result)   Collection Time: 05/29/18  3:59 PM  Result Value Ref Range Status   Specimen Description   Final    URINE, RANDOM Performed at [facility] [address] [place], [place] [address]    Special Requests   Final    NONE Performed at [facility], [place] [address], [place], [place] [address]    Culture   Final    CULTURE REINCUBATED FOR BETTER GROWTH Performed at [facility], [place] [address] [place], [place] [address]    Report Status PENDING  Incomplete  Culture, blood (routine x 2)     Status: None (Preliminary result)   Collection Time: 05/29/18 11:37 PM  Result Value Ref Range Status   Specimen Description   Final    BLOOD LEFT HAND Performed at [facility] [address], [place], [place] [address]    Special Requests   Final    BOTTLES DRAWN AEROBIC ONLY Blood Culture adequate volume Performed at [facility] [address], [place], [place] [address]    Culture   Final    NO GROWTH 1 DAY Performed at [facility], [place] [address], [place], [place] [address]    Report Status PENDING  Incomplete  Culture, blood (routine x 2)     Status: None (Preliminary result)   Collection Time: 05/29/18 11:37 PM  Result Value Ref Range Status   Specimen Description   Final    BLOOD LEFT ANTECUBITAL Performed at [facility] [address], [place], [place] [address]    Special Requests   Final    BOTTLES DRAWN AEROBIC ONLY Blood Culture adequate volume Performed at [facility] [address] [place], [place] [address]    Culture   Final    NO GROWTH 1 DAY Performed at [facility], [place] [address], [place],   [address]    Report Status PENDING  Incomplete  MRSA PCR Screening     Status: None  Collection Time: 05/30/18  9:53 AM  Result Value Ref Range Status   MRSA by PCR NEGATIVE NEGATIVE Final    Comment:        The GeneXpert MRSA Assay (FDA approved for NASAL specimens only), is one component of a comprehensive MRSA colonization surveillance program. It is not intended to diagnose MRSA infection nor to guide or monitor treatment for MRSA infections. Performed at [facility], [place] [address] [place], [place] [address]      Labs: BNP (last 3 results) Recent Labs    09/05/17 0636 05/29/18 1558 05/31/18 0626  BNP 175.2* 522.7* 637.8*   Basic Metabolic Panel: Recent Labs  Lab 05/29/18 1559 05/30/18 0541 05/31/18 0626  NA 131* 135 137  K 5.7* 5.1 4.4  CL 96* 100 101  CO2 26 26 28   GLUCOSE 234* 203* 104*  BUN 10 9 11   CREATININE 1.06 0.96 1.13  CALCIUM 8.5* 8.4* 8.5*  MG  --   --  1.6*   Liver Function Tests: Recent Labs  Lab 05/29/18 1559  AST 17  ALT 15  ALKPHOS 147*  BILITOT 0.5  PROT 6.1*  ALBUMIN 2.6*   Recent Labs  Lab 05/29/18 1559  LIPASE 20   No results for input(s): AMMONIA in the last 168 hours. CBC: Recent Labs  Lab 05/29/18 1559 05/31/18 0626  WBC 10.0 9.7  HGB 9.6* 9.9*  HCT 30.0* 31.6*  MCV 92.3 93.5  PLT 320 315   Cardiac Enzymes: Recent Labs  Lab 05/29/18 1558  TROPONINI <0.03   BNP: Invalid input(s): POCBNP CBG: Recent Labs  Lab 05/30/18 1534 05/30/18 2103 05/31/18 0120 05/31/18 0420 05/31/18 0745  GLUCAP 200* 168* 131* 98 106*   D-Dimer No results for input(s): DDIMER in the last 72 hours. Hgb A1c No results for input(s): HGBA1C in the last 72 hours. Lipid Profile No results for input(s): CHOL, HDL, LDLCALC, TRIG, CHOLHDL, LDLDIRECT in the last 72 hours. Thyroid function studies No results for input(s): TSH, T4TOTAL, T3FREE, THYROIDAB in the last 72 hours.  Invalid input(s): FREET3 Anemia  work up No results for input(s): VITAMINB12, FOLATE, FERRITIN, TIBC, IRON, RETICCTPCT in the last 72 hours. Urinalysis    Component Value Date/Time   COLORURINE YELLOW 05/29/2018 1559   APPEARANCEUR CLOUDY (A) 05/29/2018 1559   LABSPEC 1.017 05/29/2018 1559   PHURINE 7.0 05/29/2018 1559   GLUCOSEU NEGATIVE 05/29/2018 1559   HGBUR NEGATIVE 05/29/2018 1559   BILIRUBINUR NEGATIVE 05/29/2018 1559   KETONESUR 5 (A) 05/29/2018 1559   PROTEINUR 100 (A) 05/29/2018 1559   UROBILINOGEN 1.0 07/01/2015 1455   NITRITE NEGATIVE 05/29/2018 1559   LEUKOCYTESUR LARGE (A) 05/29/2018 1559   Sepsis Labs Invalid input(s): PROCALCITONIN,  WBC,  LACTICIDVEN Microbiology Recent Results (from the past 240 hour(s))  Urine culture     Status: None (Preliminary result)   Collection Time: 05/29/18  3:59 PM  Result Value Ref Range Status   Specimen Description   Final    URINE, RANDOM Performed at [facility], [place] [address], [place], [place] [address]    Special Requests   Final    NONE Performed at [facility], [place] [address], [place], [place] [address]    Culture   Final    CULTURE REINCUBATED FOR BETTER GROWTH Performed at [facility], [place] [address], [place], [place] [address]    Report Status PENDING  Incomplete  Culture, blood (routine x 2)     Status: None (Preliminary  result)   Collection Time: 05/29/18 11:37 PM  Result Value Ref Range Status   Specimen Description   Final    BLOOD LEFT HAND Performed at [facility] [address], [place], [place] [address]    Special Requests   Final    BOTTLES DRAWN AEROBIC ONLY Blood Culture adequate volume Performed at [facility] [address] [place], [place] [address]    Culture   Final    NO GROWTH 1 DAY Performed at [facility], [place] [address] [place], [place] [address]    Report Status PENDING  Incomplete  Culture, blood (routine x 2)      Status: None (Preliminary result)   Collection Time: 05/29/18 11:37 PM  Result Value Ref Range Status   Specimen Description   Final    BLOOD LEFT ANTECUBITAL Performed at [facility] [address], [place], [place] [address]    Special Requests   Final    BOTTLES DRAWN AEROBIC ONLY Blood Culture adequate volume Performed at [facility] [address], [place], [place] [address]    Culture   Final    NO GROWTH 1 DAY Performed at [facility], [place] [address] [place], [place] [address]    Report Status PENDING  Incomplete  MRSA PCR Screening     Status: None   Collection Time: 05/30/18  9:53 AM  Result Value Ref Range Status   MRSA by PCR NEGATIVE NEGATIVE Final    Comment:        The GeneXpert MRSA Assay (FDA approved for NASAL specimens only), is one component of a comprehensive MRSA colonization surveillance program. It is not intended to diagnose MRSA infection nor to guide or monitor treatment for MRSA infections. Performed at [facility], [place] [address], [place], [place] [address]      Time coordinating discharge:  I have spent 35 minutes face to face with the patient and on the ward discussing the patients care, assessment, plan and disposition with other care givers. >50% of the time was devoted counseling the patient about the risks and benefits of treatment/Discharge disposition and coordinating care.   SIGNED:   Damita Lack, MD  [facility] 05/31/2018, 10:02 AM Pager   If 7PM-7AM, please contact night-coverage [email or web address] Password [email or web address]

## 2018-05-31 NOTE — Progress Notes (Signed)
Patient is discharging back to [facility] room 216B. [name] confirmed bed with facility and faxed appropriate documents.  RN call report to: [phone].  [facility] has been called for transport.  No more [name] needs. Signing off.   Pricilla Holm, MSW, [facility] [phone]

## 2018-06-01 LAB — URINE CULTURE

## 2018-06-02 ENCOUNTER — Non-Acute Institutional Stay (SKILLED_NURSING_FACILITY): Payer: Medicare Other | Admitting: Internal Medicine

## 2018-06-02 ENCOUNTER — Encounter: Payer: Self-pay | Admitting: Internal Medicine

## 2018-06-02 ENCOUNTER — Telehealth: Payer: Self-pay

## 2018-06-02 DIAGNOSIS — IMO0002 Reserved for concepts with insufficient information to code with codable children: Secondary | ICD-10-CM

## 2018-06-02 DIAGNOSIS — E1165 Type 2 diabetes mellitus with hyperglycemia: Secondary | ICD-10-CM

## 2018-06-02 DIAGNOSIS — L89159 Pressure ulcer of sacral region, unspecified stage: Secondary | ICD-10-CM | POA: Diagnosis not present

## 2018-06-02 DIAGNOSIS — Z91199 Patient's noncompliance with other medical treatment and regimen due to unspecified reason: Secondary | ICD-10-CM

## 2018-06-02 DIAGNOSIS — G894 Chronic pain syndrome: Secondary | ICD-10-CM

## 2018-06-02 DIAGNOSIS — Z79899 Other long term (current) drug therapy: Secondary | ICD-10-CM

## 2018-06-02 DIAGNOSIS — L109 Pemphigus, unspecified: Secondary | ICD-10-CM | POA: Diagnosis not present

## 2018-06-02 DIAGNOSIS — Z960 Presence of urogenital implants: Secondary | ICD-10-CM

## 2018-06-02 DIAGNOSIS — Z978 Presence of other specified devices: Secondary | ICD-10-CM

## 2018-06-02 DIAGNOSIS — E1143 Type 2 diabetes mellitus with diabetic autonomic (poly)neuropathy: Secondary | ICD-10-CM

## 2018-06-02 DIAGNOSIS — Z9119 Patient's noncompliance with other medical treatment and regimen: Secondary | ICD-10-CM

## 2018-06-02 LAB — CBC AND DIFFERENTIAL
HCT: 29 — AB (ref 41–53)
Hemoglobin: 9.4 — AB (ref 13.5–17.5)
Neutrophils Absolute: 7
Platelets: 311 (ref 150–399)
WBC: 10.2

## 2018-06-02 LAB — BASIC METABOLIC PANEL
BUN: 15 (ref 4–21)
CREATININE: 1.1 (ref 0.6–1.3)
Glucose: 187
Potassium: 5 (ref 3.4–5.3)
Sodium: 136 — AB (ref 137–147)

## 2018-06-02 NOTE — Telephone Encounter (Signed)
Possible re-admission to facility. This is a patient you were seeing at [facility] . [facility] F/U is needed if patient was re-admitted to facility upon discharge. Hospital discharge from [facility] on 05/31/2018

## 2018-06-02 NOTE — Progress Notes (Signed)
Patient ID: [name], male   DOB: Jan 27, 1946, 72 y.o.   MRN: [id number]   Provider:  DR Arletha Grippe Location:  [place] Room Number: [place] of Service:  SNF (31)  PCP: Gildardo Cranker, DO Patient Care Team: Gildardo Cranker, DO as PCP - [facility] (Internal [facility]) [facility], [place] ([place])  Extended Emergency Contact Information Primary Emergency Contact: Whiting,[name] Address: [address]          [place], [place] [address] [name] of [place] Phone: [phone] Relation: Friend Secondary Emergency Contact: Almedia of [place] Phone: [phone] Mobile Phone: [phone] Relation: Brother  Code Status: DNR Goals of Care: Advanced Directive information Advanced Directives 06/02/2018  Does Patient Have a Medical Advance Directive? Yes  Type of Advance Directive Out of facility DNR (pink MOST or yellow form)  Does patient want to make changes to medical advance directive? No - Patient declined  Copy of [place] in Chart? -  Would patient like information on creating a medical advance directive? No - Patient declined  Pre-existing out of facility DNR order (yellow form or pink MOST form) Yellow form placed in chart (order not valid for inpatient use);Pink MOST form placed in chart (order not valid for inpatient use)      Chief Complaint  Patient presents with  . Readmit To SNF    Readmission    HPI: Patient is a 72 y.o. male seen today for re-admission to SNF following hospital stay for intractable N/V, CAUTI, chronic indwelling foley cath, Acute/chronic dHF, uncontrolled DM, bullous pemphigud, hyperkalemia, sacral decub ulcer, cellulitis of buttock, acute adrenal insufficiency, Hx PE/ESBL E coli infection/MRSA infection. Foley cath changed. ID consulted. UTI tx with IV meropenum and to be d/c'd on 4 additional days. Urology o/p f/u recommended.  celluelitis initially tx with IV vanco that was eventually stopped. Wound care followed. lantus dose decreased due to poor po intake. UC (+) multiple spp. He presents to SNF to continue long term care.  Today he has no concerns. He states he is "trying to wean" himself off O2. He gets SOB at times. Makes poor diet choices. BS <110 today. He is traditionally noncompliant with meds and dietary recommendations.   Dyslipidemia - stable on zetia 10 mg daily. lipitor caused swelling. LDL 92  DM - uncontrolled but improved A1c 9.8% (prev 10.3%). He gets Tuojeo 42 units nightly and humalog 12 units with meals. 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 he has had steroid injections in the past. Pain controlled on ultram 100 mg three times daily with tylenol 1 gm three times daily; voltaren gel to left shoulder three times daily   Hypertension - stable on norvasc 5 mg daily; lopressor 12.5 mg twice daily   Chronic diastolic heart failure - stable on demadex 20 mg  Daily; ASA 81 mg daily;  lopressor 12.5 mg twice daily; NTG SL 0.4 mg every 5 mins X 3 as needed  GERD - stable on prilosec 20 mg daily  Hypokalemia - stable on KCl 10 meq tabs four times daily. He is occasionally noncompliant with med  Hx CKD - stage 3. Cr 0.9   Hx PE - stable on xeralto    Past Medical History:  Diagnosis Date  . Arthritis    "hands, ankles, knees" (11/10/2014)  . Bladder spasms   . CAD (coronary artery disease)   . CAD in  native artery 09/15/2015  . CHF (congestive heart failure) ([place])   . Chronic indwelling Foley catheter   . Chronic lower back pain   . Depression   . Diabetic diarrhea ([place])   . Diabetic neuropathy, painful ([place])   . DVT (deep venous thrombosis) (HCC) 1980's   LLE  . GERD (gastroesophageal reflux disease)   . Hyperlipidemia   . Hypertension   . Neurogenic bladder   . Obese   . OSA (obstructive sleep apnea)    "they wanted me to wear a mask; I couldn't" (11/10/2014)  . Pneumonia 1999  . PONV (postoperative nausea and vomiting)   . Stroke [facility]) 10/2011; 06/2014   right hand numbness/notes 10/20/2011, pt not sure he had stroke in 2013; "right hand weaker and right face not quite right since" (11/10/2014)  . Type II diabetes mellitus ([place])   . Uncontrolled type II diabetes with peripheral autonomic neuropathy ([place]) 05/09/2015   Past Surgical History:  Procedure Laterality Date  . CARDIAC CATHETERIZATION N/A 05/02/2015   Procedure: Left Heart Cath and Coronary Angiography;  Surgeon: Lorretta Harp, MD;  Location: [place] CV LAB;  Service: Cardiovascular;  Laterality: N/A;  . CARDIAC CATHETERIZATION N/A 05/04/2015   Procedure: Left Heart Cath and Coronary Angiography;  Surgeon: Wellington Hampshire, MD;  Location: [place] CV LAB;  Service: Cardiovascular;  Laterality: N/A;  . CARPAL TUNNEL RELEASE Bilateral ~ 2003-2004  . CHOLECYSTECTOMY OPEN  1970's?  Marland Kitchen GASTROPLASTY    . JOINT REPLACEMENT    . LEFT HEART CATHETERIZATION WITH CORONARY ANGIOGRAM N/A 10/24/2011   Procedure: LEFT HEART CATHETERIZATION WITH CORONARY ANGIOGRAM;  Surgeon: Candee Furbish, MD;  Location: [facility] CATH LAB;  Service: Cardiovascular;  Laterality: N/A;  right radial artery approach  . TOTAL KNEE ARTHROPLASTY Right 1990's    reports that he quit smoking about 41 years ago. His smoking use included cigarettes. He has a 40.00 pack-year smoking history. He has never used smokeless tobacco. He reports that he does not drink alcohol  or use drugs. Social History   Socioeconomic History  . Marital status: Widowed    Spouse name: Not on file  . Number of children: Not on file  . Years of education: Not on file  . Highest education level: Not on file  Occupational History  . Occupation: Retired  Scientific laboratory technician  . Financial resource strain: Not hard at all  . Food insecurity:    Worry: Never true    Inability: Never true  . Transportation needs:    Medical: No    Non-medical: No  Tobacco Use  . Smoking status: Former Smoker    Packs/day: 2.00    Years: 20.00    Pack years: 40.00    Types: Cigarettes    Last attempt to quit: 09/10/1976    Years since quitting: 41.7  . Smokeless tobacco: Never Used  Substance and Sexual Activity  . Alcohol use: No    Alcohol/week:  0.0 standard drinks    Comment: FORMER ALCOHOLIC; "sober since 26/37/8588"  . Drug use: No  . Sexual activity: Never  Lifestyle  . Physical activity:    Days per week: 0 days    Minutes per session: 0 min  . Stress: Only a little  Relationships  . Social connections:    Talks on phone: Once a week    Gets together: Once a week    Attends religious service: Never    Active member of club or organization: No    Attends meetings of clubs or organizations: Never    Relationship status: Widowed  . Intimate partner violence:    Fear of current or ex partner: No    Emotionally abused: No    Physically abused: No    Forced sexual activity: No  Other Topics Concern  . Not on file  Social History Narrative  . Not on file    Functional Status Survey:    Family History  Problem Relation Age of Onset  . Diabetes type I Father   . Hypertension Mother   . Cancer Brother         Testicular Cancer  . Cancer Brother        Leukemia    Health Maintenance  Topic Date Due  . COLONOSCOPY  07/02/2018 (Originally 11/12/1997)  . Hepatitis C Screening  07/02/2018 (Originally November 18, 1945)  . INFLUENZA VACCINE  07/22/2018 (Originally 04/10/2018)  .  HEMOGLOBIN A1C  07/16/2018  . URINE MICROALBUMIN  01/22/2019  . FOOT EXAM  02/06/2019  . OPHTHALMOLOGY EXAM  04/16/2019  . TETANUS/TDAP  Discontinued  . PNA vac Low Risk Adult  Discontinued    Allergies  Allergen Reactions  . Ace Inhibitors Swelling    Pt tolerates lisinopril  . Lipitor [Atorvastatin Calcium] Swelling  . Metformin And Related Swelling  . Morphine And Related Other (See Comments)    Sweating, feels like is "in rocky boat."  . Robaxin [Methocarbamol] Other (See Comments)    Feels like he is shaky    Outpatient Encounter Medications as of 06/02/2018  Medication Sig  . acetaminophen (TYLENOL) 500 MG tablet Take 1,000 mg by mouth every 8 (eight) hours as needed.   Marland Kitchen aluminum-magnesium hydroxide-simethicone (MAALOX) 502-774-12 MG/5ML SUSP Take 30 mLs by mouth every 6 (six) hours as needed (indigestion).   Marland Kitchen amLODipine (NORVASC) 5 MG tablet Take 5 mg by mouth daily.  . bisacodyl (DULCOLAX) 10 MG suppository Place 10 mg rectally daily as needed for moderate constipation.  . Cholecalciferol 1000 units tablet Take 1,000 Units by mouth daily.  . diclofenac sodium (VOLTAREN) 1 % GEL Apply topically 3 (three) times daily. Apply to left shoulder topically three times daily   . escitalopram (LEXAPRO) 5 MG tablet Take 5 mg by mouth daily.  Marland Kitchen ezetimibe (ZETIA) 10 MG tablet Take 10 mg by mouth daily.  . folic acid (FOLVITE) 1 MG tablet Take 1 mg by mouth daily.  Marland Kitchen gabapentin (NEURONTIN) 100 MG capsule Take 200 mg by mouth 2 (two) times daily.  Marland Kitchen guaiFENesin (MUCINEX) 600 MG 12 hr tablet Take 600 mg by mouth 2 (two) times daily as needed for cough.   . hydrOXYzine (ATARAX/VISTARIL) 25 MG tablet Take 25 mg by mouth 3 (three) times daily.   . Insulin Glargine (TOUJEO SOLOSTAR) 300 UNIT/ML SOPN Inject 85 Units into the skin at bedtime.   . insulin lispro (HUMALOG) 100 UNIT/ML injection Inject 25 Units into the skin 3 (three) times daily with meals. Also  inject 5 units subcutaneously  every 8 hours as needed for CBG greater than 300  . ipratropium-albuterol (DUONEB) 0.5-2.5 (3) MG/3ML SOLN Take 3 mLs by nebulization every 6 (six) hours as needed (sob).   Marland Kitchen linagliptin (TRADJENTA) 5 MG TABS tablet Take 5 mg by mouth daily.  . meropenem 1 g in sodium chloride 0.9 % 100 mL Inject 1 g into the vein every 8 (eight) hours for 4 days.  . methotrexate (RHEUMATREX) 10 MG tablet Take 10 mg by mouth every Wednesday. Caution: Chemotherapy. Protect from light.  . metoprolol succinate (TOPROL-XL) 25 MG 24 hr tablet Take 25 mg by mouth daily.  . Multiple Vitamin (MULTIVITAMIN) tablet Take 1 tablet by mouth daily.  . nitroGLYCERIN (NITROSTAT) 0.4 MG SL tablet Place 0.4 mg under the tongue every 5 (five) minutes as needed for chest pain.  Marland Kitchen omeprazole (PRILOSEC) 20 MG capsule Take 20 mg by mouth daily.  . ondansetron (ZOFRAN) 4 MG tablet Take 4 mg by mouth every 6 (six) hours as needed for nausea or vomiting.  . OXYGEN Inhale into the lungs. O2 at 2l/min via nasal canula as needed for shortness of breath  . polyethylene glycol (MIRALAX / GLYCOLAX) packet Take 17 g by mouth daily as needed for mild constipation or moderate constipation.  . potassium chloride (K-DUR) 10 MEQ tablet Take 10 mEq by mouth 4 (four) times daily.   . predniSONE (DELTASONE) 10 MG tablet Take 10 mg by mouth daily.  . rivaroxaban (XARELTO) 10 MG TABS tablet Take 10 mg by mouth daily.  Marland Kitchen rOPINIRole (REQUIP) 0.5 MG tablet Take 0.5 mg by mouth at bedtime. 2100  . sennosides-docusate sodium (SENOKOT-S) 8.6-50 MG tablet Take 2 tablets by mouth at bedtime. 2100  . simethicone (MYLICON) 627 MG chewable tablet Chew 125 mg by mouth every 6 (six) hours as needed for flatulence.  . torsemide (DEMADEX) 20 MG tablet Take 20 mg by mouth daily.   . traMADol (ULTRAM) 50 MG tablet Take 2 tablets (100 mg total) by mouth 3 (three) times daily. Hold if Lethargic or confused  . traZODone (DESYREL) 100 MG tablet Take 100 mg by mouth at  bedtime.   . triamcinolone cream (KENALOG) 0.1 % Apply 1 application topically 2 (two) times daily.  . vitamin C (ASCORBIC ACID) 500 MG tablet Take 500 mg by mouth 2 (two) times daily.   No facility-administered encounter medications on file as of 06/02/2018.     Review of Systems  Respiratory: Positive for shortness of breath.   Musculoskeletal: Positive for gait problem.  Skin: Positive for wound (followed by facillity wound care).  All other systems reviewed and are negative.   Vitals:   06/02/18 1122  BP: 118/78  Pulse: (!) 103  Resp: 20  Temp: (!) 97.3 F (36.3 C)  SpO2: 97%  Weight: (!) 348 lb 3.2 oz (157.9 kg)  Height: 6\' 1"  (1.854 m)   Body mass index is 45.94 kg/m. Physical Exam  Constitutional: He is oriented to person, place, and time. He appears well-developed and well-nourished.  Sitting up in bed in NAD, [place] O2 NOT INTACT  HENT:  Mouth/Throat: Oropharynx is clear and moist.  MMM; no oral thrush  Eyes: Pupils are equal, round, and reactive to light. No scleral icterus.  Neck: Neck supple. Carotid bruit is not present. No thyromegaly present.  Cardiovascular: Normal rate, regular rhythm and intact distal pulses. Exam reveals no gallop and no friction rub.  Murmur (1/6 SEM) heard. no distal LE swelling. No  calf TTP  Pulmonary/Chest: Effort normal. He has decreased breath sounds (b/l at base). He has no wheezes. He has no rales. He exhibits no tenderness.  Abdominal: Soft. Bowel sounds are normal. He exhibits no distension, no abdominal bruit, no pulsatile midline mass and no mass. There is no hepatomegaly. There is no tenderness. There is no rebound and no guarding.  obese  Genitourinary:  Genitourinary Comments: Foley cath DTG clear dark yellow urine  Musculoskeletal: He exhibits edema (small and large joints).  Lymphadenopathy:    He has no cervical adenopathy.  Neurological: He is alert and oriented to person, place, and time. He has normal reflexes.  Skin:  Skin is warm and dry. Rash (on abdomen) noted.  Right foot dsg c/d/i; sacral wound followed by facility wound care  Psychiatric: He has a normal mood and affect. His behavior is normal. Judgment and thought content normal.    Labs reviewed: Basic Metabolic Panel: Recent Labs    09/03/17 1216  09/05/17 0636  05/29/18 1559 05/30/18 0541 05/31/18 0626  NA  --    < > 138   < > 131* 135 137  K 6.0*   < > 3.8   < > 5.7* 5.1 4.4  CL  --    < > 101   < > 96* 100 101  CO2  --    < > 27   < > 26 26 28   GLUCOSE  --    < > 137*   < > 234* 203* 104*  BUN  --    < > 13   < > 10 9 11   CREATININE  --    < > 0.94   < > 1.06 0.96 1.13  CALCIUM  --    < > 8.8*   < > 8.5* 8.4* 8.5*  MG 1.6*  --  1.7  --   --   --  1.6*   < > = values in this interval not displayed.   Liver Function Tests: Recent Labs    08/09/17 09/08/17 0443 05/29/18 1559  AST 11* 13* 17  ALT 14 15* 15  ALKPHOS 92 70 147*  BILITOT  --  0.3 0.5  PROT  --  5.9* 6.1*  ALBUMIN  --  2.6* 2.6*   Recent Labs    05/29/18 1559  LIPASE 20   No results for input(s): AMMONIA in the last 8760 hours. CBC: Recent Labs    09/08/17 0443  12/17/17 12/24/17 01/13/18 05/29/18 1559 05/31/18 0626  WBC 10.0   < > 14.7 12.1 9.4 10.0 9.7  NEUTROABS  --    < > 13 8 6   --   --   HGB 11.3*   < > 12.1* 11.6* 11.1* 9.6* 9.9*  HCT 34.6*   < > 37* 36* 34* 30.0* 31.6*  MCV 91.5  --   --   --   --  92.3 93.5  PLT 226   < > 258 235 263 320 315   < > = values in this interval not displayed.   Cardiac Enzymes: Recent Labs    09/05/17 1819 09/05/17 2325 05/29/18 1558  TROPONINI <0.03 <0.03 <0.03   BNP: Invalid input(s): POCBNP Lab Results  Component Value Date   HGBA1C 9.7 01/13/2018   Lab Results  Component Value Date   TSH 2.954 09/15/2015   Lab Results  Component Value Date   VITAMINB12 357 06/02/2017   Lab Results  Component Value Date   FOLATE 19.0  06/02/2017   Lab Results  Component Value Date   IRON 45 06/02/2017     TIBC 227 (L) 06/02/2017   FERRITIN 123 06/02/2017    Imaging and Procedures obtained prior to SNF admission: Dg Chest 2 View  Result Date: 05/29/2018 CLINICAL DATA:  Nausea, vomiting for 2 days, some shortness of breath EXAM: CHEST - 2 VIEW COMPARISON:  Chest x-ray of 09/05/2017 FINDINGS: The lungs are poorly aerated. However there is cardiomegaly present with moderate pulmonary vascular congestion. No pleural effusion is seen with certainty. No bony abnormality is noted. IMPRESSION: Cardiomegaly and probable moderate pulmonary vascular congestion. Electronically Signed   By: Ivar Drape M.D.   On: 05/29/2018 16:29   Ct Abdomen Pelvis W Contrast  Result Date: 05/29/2018 CLINICAL DATA:  Nausea and vomiting. EXAM: CT ABDOMEN AND PELVIS WITH CONTRAST TECHNIQUE: Multidetector CT imaging of the abdomen and pelvis was performed using the standard protocol following bolus administration of intravenous contrast. CONTRAST:  188mL ISOVUE-300 IOPAMIDOL (ISOVUE-300) INJECTION 61% COMPARISON:  05/27/2017 FINDINGS: Lower chest: Coronary artery calcification is evident. Bibasilar collapse/consolidation noted, left greater than right. Small bilateral pleural effusions are evident. Hepatobiliary: No focal abnormality within the liver parenchyma. Gallbladder surgically absent. No intrahepatic or extrahepatic biliary dilation. Pancreas: No focal mass lesion. No dilatation of the main duct. No intraparenchymal cyst. No peripancreatic edema. Spleen: No splenomegaly. No focal mass lesion. Adrenals/Urinary Tract: Right adrenal gland unremarkable. 2.1 cm myelolipoma in the left adrenal gland is stable. 14 mm low-density lesion upper pole right kidney is stable. Left kidney unremarkable. No evidence for hydroureter. Bladder is decompressed. Stomach/Bowel: Surgical clips noted in the region of the gastric cardia with staple line noted proximal stomach Duodenum is normally positioned as is the ligament of Treitz. Small bowel is  decompressed. The terminal ileum is normal. A The appendix is normal. No gross colonic mass. No colonic wall thickening. No substantial diverticular change. Vascular/Lymphatic: There is abdominal aortic atherosclerosis without aneurysm. There is no gastrohepatic or hepatoduodenal ligament lymphadenopathy. No intraperitoneal or retroperitoneal lymphadenopathy. No pelvic sidewall lymphadenopathy. Reproductive: The prostate gland and seminal vesicles have normal imaging features.Foley catheter balloon is inflated in the posterior urethra. Other: No intraperitoneal free fluid. Musculoskeletal: Degenerative changes noted right hip. Bones are diffusely demineralized. Anterior wedge compression deformity at T11 is stable. Degenerative changes noted at L4-5. Bilateral gynecomastia. IMPRESSION: 1. No acute findings in the abdomen or pelvis. Specifically, no findings to explain the history of nausea and vomiting. 2. Balloon for the Foley catheter is inflated in the posterior urethra. 3. 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 4.  Aortic Atherosclerois (ICD10-170.0) Electronically Signed   By: Misty Stanley M.D.   On: 05/29/2018 17:57    Assessment/Plan   ICD-10-CM   1. Chronic indwelling Foley catheter Z96.0   2. Pressure injury of skin of sacral region, unspecified injury stage L89.159   3. Bullous pemphigus L10.9   4. Uncontrolled type II diabetes with peripheral autonomic neuropathy (HCC) E11.43    E11.65   5. Chronic pain syndrome G89.4   6. Medically noncompliant Z91.19   7. High risk medication use Z79.899     Cont current meds as ordered - IV abx completes today  F/u with urology as scheduled  PT/OT/ST as indicated  Foley cath care as indicated  Wound care as ordered  GOAL: short term rehab [place] continue long term care. Communicated with pt and nursing.  [facility] NP to follow  Will follow  Labs/tests ordered: cbc, bmp    [facility]  Wells Guiles  [facility]  Adult Medicine [address] [place], [place] [address] [phone] Cell (Monday-Friday 8 AM - 5 PM) [phone] After 5 PM and follow prompts

## 2018-06-04 LAB — CULTURE, BLOOD (ROUTINE X 2)
CULTURE: NO GROWTH
Culture: NO GROWTH
SPECIAL REQUESTS: ADEQUATE
Special Requests: ADEQUATE

## 2018-06-07 LAB — BASIC METABOLIC PANEL
BUN: 38 — AB (ref 4–21)
CREATININE: 1.2 (ref 0.6–1.3)
Glucose: 160
POTASSIUM: 6.5 — AB (ref 3.4–5.3)
SODIUM: 137 (ref 137–147)

## 2018-06-07 LAB — CBC AND DIFFERENTIAL
HEMATOCRIT: 29 — AB (ref 41–53)
HEMOGLOBIN: 9.3 — AB (ref 13.5–17.5)
Neutrophils Absolute: 7
Platelets: 318 (ref 150–399)
WBC: 9.2

## 2018-06-08 DIAGNOSIS — G894 Chronic pain syndrome: Secondary | ICD-10-CM | POA: Insufficient documentation

## 2018-06-08 DIAGNOSIS — Z91199 Patient's noncompliance with other medical treatment and regimen due to unspecified reason: Secondary | ICD-10-CM | POA: Insufficient documentation

## 2018-06-08 DIAGNOSIS — Z9119 Patient's noncompliance with other medical treatment and regimen: Secondary | ICD-10-CM | POA: Insufficient documentation

## 2018-06-08 LAB — BASIC METABOLIC PANEL
BUN: 38 — AB (ref 4–21)
CREATININE: 1.2 (ref 0.6–1.3)
GLUCOSE: 178
Potassium: 5.1 (ref 3.4–5.3)
SODIUM: 135 — AB (ref 137–147)

## 2018-06-30 ENCOUNTER — Encounter: Payer: Self-pay | Admitting: Internal Medicine

## 2018-06-30 ENCOUNTER — Non-Acute Institutional Stay (SKILLED_NURSING_FACILITY): Payer: Medicare Other | Admitting: Internal Medicine

## 2018-06-30 DIAGNOSIS — IMO0002 Reserved for concepts with insufficient information to code with codable children: Secondary | ICD-10-CM

## 2018-06-30 DIAGNOSIS — L109 Pemphigus, unspecified: Secondary | ICD-10-CM

## 2018-06-30 DIAGNOSIS — G894 Chronic pain syndrome: Secondary | ICD-10-CM

## 2018-06-30 DIAGNOSIS — E1165 Type 2 diabetes mellitus with hyperglycemia: Secondary | ICD-10-CM

## 2018-06-30 DIAGNOSIS — R6883 Chills (without fever): Secondary | ICD-10-CM

## 2018-06-30 DIAGNOSIS — R5383 Other fatigue: Secondary | ICD-10-CM

## 2018-06-30 DIAGNOSIS — Z79899 Other long term (current) drug therapy: Secondary | ICD-10-CM

## 2018-06-30 DIAGNOSIS — T07XXXA Unspecified multiple injuries, initial encounter: Secondary | ICD-10-CM

## 2018-06-30 DIAGNOSIS — E1143 Type 2 diabetes mellitus with diabetic autonomic (poly)neuropathy: Secondary | ICD-10-CM | POA: Diagnosis not present

## 2018-06-30 NOTE — Progress Notes (Signed)
Patient ID: [name], male   DOB: 1946/01/11, 72 y.o.   MRN: [id number]   Location:  [place] Room Number: [place] of Service:  SNF [phone]) Provider:  [place], Lavelle, DO  Patient Care Team: Gildardo Cranker, DO as PCP - [facility] ([facility]) [facility], [place] ([place])  Extended Emergency Contact Information Primary Emergency Contact: Whiting,[name] Address: [address]          [place], [place] [address] [name] of [place] Phone: [phone] Relation: Friend Secondary Emergency Contact: [place] of [place] Phone: [phone] Mobile Phone: [phone] Relation: Brother  Code Status:  DNR Goals of care: Advanced Directive information Advanced Directives 06/30/2018  Does Patient Have a Medical Advance Directive? Yes  Type of Advance Directive Out of facility DNR (pink MOST or yellow form)  Does patient want to make changes to medical advance directive? No - Patient declined  Copy of [place] in Chart? -  Would patient like information on creating a medical advance directive? No - Patient declined  Pre-existing out of facility DNR order (yellow form or pink MOST form) Yellow form placed in chart (order not valid for inpatient use);Pink MOST form placed in chart (order not valid for inpatient use)     Chief Complaint  Patient presents with  . Medical Management of Chronic Issues    [facility]    HPI:  Pt is a 72 y.o. male seen today for medical management of chronic diseases.  He c/o chills and not feeling well today. No other concerns. Appetite reduced. Sleeps well. No fever. No recent falls. CBGs fluctuate. He is traditionally noncompliant with meds and dietary recommendations.   Dyslipidemia - stable on zetia 10 mg daily. lipitor caused swelling. LDL 92  DM - uncontrolled but improved A1c 9.8% (prev 10.3%). He gets Tuojeo 42 units  nightly and humalog 12 units with meals. 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 he has had steroid injections in the past. Pain controlled on ultram 100 mg three times daily with tylenol 1 gm three times daily; voltaren gel to left shoulder three times daily   Hypertension - stable on norvasc 5 mg daily; lopressor 12.5 mg twice daily   Chronic diastolic heart failure - stable on demadex 20 mg  Daily; ASA 81 mg daily; lopressor 12.5 mg twice daily; NTG SL 0.4 mg every 5 mins X 3 as needed  GERD - stable on prilosec 20 mg daily  Hypokalemia - stable on KCl 10 meq tabs four times daily. He is occasionally noncompliant with med  Hx CKD - stage 3. Cr 0.9   Hx PE - stable on xeralto     Past Medical History:  Diagnosis Date  . Arthritis    "hands, ankles, knees" (11/10/2014)  . Bladder spasms   . CAD (coronary artery disease)   .  CAD in native artery 09/15/2015  . CHF (congestive heart failure) ([place])   . Chronic indwelling Foley catheter   . Chronic lower back pain   . Depression   . Diabetic diarrhea ([place])   . Diabetic neuropathy, painful ([place])   . DVT (deep venous thrombosis) (HCC) 1980's   LLE  . GERD (gastroesophageal reflux disease)   . Hyperlipidemia   . Hypertension   . Neurogenic bladder   . Obese   . OSA (obstructive sleep apnea)    "they wanted me to wear a mask; I couldn't" (11/10/2014)  . Pneumonia 1999  . PONV (postoperative nausea and vomiting)   . Stroke [facility]) 10/2011;  06/2014   right hand numbness/notes 10/20/2011, pt not sure he had stroke in 2013; "right hand weaker and right face not quite right since" (11/10/2014)  . Type II diabetes mellitus ([place])   . Uncontrolled type II diabetes with peripheral autonomic neuropathy ([place]) 05/09/2015   Past Surgical History:  Procedure Laterality Date  . CARDIAC CATHETERIZATION N/A 05/02/2015   Procedure: Left Heart Cath and Coronary Angiography;  Surgeon: Lorretta Harp, MD;  Location: [place] CV LAB;  Service: Cardiovascular;  Laterality: N/A;  . CARDIAC CATHETERIZATION N/A 05/04/2015   Procedure: Left Heart Cath and Coronary Angiography;  Surgeon: Wellington Hampshire, MD;  Location: [place] [facility] LAB;  Service: Cardiovascular;  Laterality: N/A;  . CARPAL TUNNEL RELEASE Bilateral ~ 2003-2004  . CHOLECYSTECTOMY OPEN  1970's?  Marland Kitchen GASTROPLASTY    . JOINT REPLACEMENT    . LEFT HEART CATHETERIZATION WITH CORONARY ANGIOGRAM N/A 10/24/2011   Procedure: LEFT HEART CATHETERIZATION WITH CORONARY ANGIOGRAM;  Surgeon: Candee Furbish, MD;  Location: [facility] CATH LAB;  Service: Cardiovascular;  Laterality: N/A;  right radial artery approach  . TOTAL KNEE ARTHROPLASTY Right 1990's    Allergies  Allergen Reactions  . Ace Inhibitors Swelling    Pt tolerates lisinopril  . Lipitor [Atorvastatin Calcium] Swelling  . Metformin And Related Swelling  . Morphine And Related Other (See Comments)    Sweating, feels like is "in rocky boat."  . Robaxin [Methocarbamol] Other (See Comments)    Feels like he is shaky    Outpatient Encounter Medications as of 06/30/2018  Medication Sig  . acetaminophen (TYLENOL) 500 MG tablet Take 1,000 mg by mouth every 8 (eight) hours as needed.   Marland Kitchen aluminum-magnesium hydroxide-simethicone (MAALOX) 254-270-62 MG/5ML SUSP Take 30 mLs by mouth every 6 (six) hours as needed (indigestion).   Marland Kitchen amLODipine (NORVASC) 5 MG tablet Take 5 mg by mouth daily.  . bisacodyl (DULCOLAX) 10 MG suppository Place 10 mg rectally  daily as needed for moderate constipation.  . Cholecalciferol 1000 units tablet Take 1,000 Units by mouth daily.  . diclofenac sodium (VOLTAREN) 1 % GEL Apply topically 3 (three) times daily. Apply to left shoulder topically three times daily   . escitalopram (LEXAPRO) 5 MG tablet Take 5 mg by mouth daily.  Marland Kitchen ezetimibe (ZETIA) 10 MG tablet Take 10 mg by mouth daily.  . folic acid (FOLVITE) 1 MG tablet Take 1 mg by mouth daily.  Marland Kitchen gabapentin (NEURONTIN) 100 MG capsule Take 200 mg by mouth 2 (two) times daily.  Marland Kitchen guaiFENesin (MUCINEX) 600 MG 12 hr tablet Take 600 mg by mouth 2 (two) times daily as needed for cough.   . hydrOXYzine (ATARAX/VISTARIL) 25 MG tablet Take 25 mg by mouth 3 (three) times daily.   . Insulin Glargine (TOUJEO SOLOSTAR) 300 UNIT/ML SOPN Inject 85 Units into the  skin at bedtime.   . insulin lispro (HUMALOG) 100 UNIT/ML injection Inject 25 Units into the skin 3 (three) times daily with meals. Also inject 8 units subcutaneously every 8 hours as needed for CBG greater than 300  . ipratropium-albuterol (DUONEB) 0.5-2.5 (3) MG/3ML SOLN Take 3 mLs by nebulization every 6 (six) hours as needed (sob).   Marland Kitchen linagliptin (TRADJENTA) 5 MG TABS tablet Take 5 mg by mouth daily.  . methotrexate (RHEUMATREX) 10 MG tablet Take 10 mg by mouth every Wednesday. Caution: Chemotherapy. Protect from light.  . metoprolol succinate (TOPROL-XL) 25 MG 24 hr tablet Take 25 mg by mouth daily.  . Multiple Vitamin (MULTIVITAMIN) tablet Take 1 tablet by mouth daily.  . nitroGLYCERIN (NITROSTAT) 0.4 MG SL tablet Place 0.4 mg under the tongue every 5 (five) minutes as needed for chest pain.  . NON FORMULARY Low concentrated sweets diet - Regular texture, NAS (no added salt)  . nystatin (MYCOSTATIN) 100000 UNIT/ML suspension Take 5 mLs by mouth 3 (three) times daily. X 14 days, ending on 07/11/18  . omeprazole (PRILOSEC) 20 MG capsule Take 20 mg by mouth daily.  . ondansetron (ZOFRAN) 4 MG tablet Take 4 mg by  mouth every 6 (six) hours as needed for nausea or vomiting.  . OXYGEN Inhale into the lungs. O2 at 2l/min via nasal canula as needed for shortness of breath  . polyethylene glycol (MIRALAX / GLYCOLAX) packet Take 17 g by mouth daily as needed for mild constipation or moderate constipation.  . predniSONE (DELTASONE) 10 MG tablet Take 10 mg by mouth daily.  . rivaroxaban (XARELTO) 10 MG TABS tablet Take 10 mg by mouth daily.  Marland Kitchen rOPINIRole (REQUIP) 0.5 MG tablet Take 0.5 mg by mouth at bedtime. 2100  . sennosides-docusate sodium (SENOKOT-S) 8.6-50 MG tablet Take 2 tablets by mouth at bedtime. 2100  . simethicone (MYLICON) 409 MG chewable tablet Chew 125 mg by mouth every 6 (six) hours as needed for flatulence.  . torsemide (DEMADEX) 20 MG tablet Take 20 mg by mouth daily.   . traMADol (ULTRAM) 50 MG tablet Take 2 tablets (100 mg total) by mouth 3 (three) times daily. Hold if Lethargic or confused  . traZODone (DESYREL) 100 MG tablet Take 100 mg by mouth at bedtime.   . triamcinolone cream (KENALOG) 0.1 % Apply 1 application topically 2 (two) times daily.  . vitamin C (ASCORBIC ACID) 500 MG tablet Take 500 mg by mouth 2 (two) times daily.  . [DISCONTINUED] potassium chloride (K-DUR) 10 MEQ tablet Take 10 mEq by mouth 4 (four) times daily.    No facility-administered encounter medications on file as of 06/30/2018.     Review of Systems  Constitutional: Positive for chills.  Musculoskeletal: Positive for arthralgias.  All other systems reviewed and are negative.   Immunization History  Administered Date(s) Administered  . Influenza Split 10/12/2011, 10/13/2011  . Influenza,inj,Quad PF,6+ Mos 05/06/2015  . Influenza-Unspecified 06/10/2014, 07/05/2017  . PPD Test 10/13/2015, 10/27/2015, 05/25/2016, 06/16/2018  . Pneumococcal-Unspecified 09/16/2017   Pertinent  Health Maintenance Due  Topic Date Due  . COLONOSCOPY  07/02/2018 (Originally 11/12/1997)  . INFLUENZA VACCINE  07/22/2018  (Originally 04/10/2018)  . HEMOGLOBIN A1C  07/16/2018  . URINE MICROALBUMIN  01/22/2019  . FOOT EXAM  02/06/2019  . OPHTHALMOLOGY EXAM  04/16/2019  . PNA vac Low Risk Adult  Discontinued   Fall Risk  04/21/2018 03/28/2017 10/24/2015 09/28/2015 09/22/2015  Falls in the past year? No Yes Yes Yes Yes  Number falls in  past yr: - 2 or more 2 or more 2 or more 2 or more  Injury with Fall? - - Yes Yes Yes  Risk Factor Category  - - High Fall Risk High Fall Risk High Fall Risk  Risk for fall due to : - - History of fall(s);Impaired balance/gait;Impaired mobility History of fall(s);Impaired balance/gait;Impaired mobility Impaired mobility;Impaired balance/gait  Follow up - - Education provided;Falls prevention discussed Education provided;Falls prevention discussed Education provided;Falls prevention discussed   Functional Status Survey:    Vitals:   06/30/18 1241  Weight: (!) 348 lb 3.2 oz (157.9 kg)  Height: 6\' 1"  (1.854 m)   Body mass index is 45.94 kg/m. Physical Exam  Constitutional: He is oriented to person, place, and time. He appears well-developed and well-nourished.  Sitting in bed in NAD; [place] O2 intact  HENT:  Mouth/Throat: Oropharynx is clear and moist.  MMM; no oral thrush  Eyes: Pupils are equal, round, and reactive to light. No scleral icterus.  Neck: Neck supple. Carotid bruit is not present. No thyromegaly present.  Cardiovascular: Normal rate, regular rhythm and intact distal pulses. Exam reveals no gallop and no friction rub.  Murmur (1/6 SEM) heard. +1 pitting LE edema b/l; no calf TTP; chronic venous stasis changes  Pulmonary/Chest: Effort normal. He has decreased breath sounds (R>L base). He has no wheezes. He has no rales. He exhibits no tenderness.  Abdominal: Soft. Bowel sounds are normal. He exhibits no distension, no abdominal bruit, no pulsatile midline mass and no mass. There is no hepatomegaly. There is no tenderness. There is no rebound and no guarding.  obese    Genitourinary:  Genitourinary Comments: Foley cath intact and DTG clear, yellow urine  Musculoskeletal: He exhibits edema (small and large joints).  Lymphadenopathy:    He has no cervical adenopathy.  Neurological: He is alert and oriented to person, place, and time. He has normal reflexes.  Skin: Skin is warm and dry. No rash noted.  Multiple contusions  Psychiatric: He has a normal mood and affect. His behavior is normal. Judgment and thought content normal.    Labs reviewed: Recent Labs    09/03/17 1216  09/05/17 0636  05/29/18 1559 05/30/18 0541 05/31/18 0626 06/02/18 06/07/18 06/08/18  NA  --    < > 138   < > 131* 135 137 136* 137 135*  K 6.0*   < > 3.8   < > 5.7* 5.1 4.4 5.0 6.5* 5.1  CL  --    < > 101   < > 96* 100 101  --   --   --   CO2  --    < > 27   < > 26 26 28   --   --   --   GLUCOSE  --    < > 137*   < > 234* 203* 104*  --   --   --   BUN  --    < > 13   < > 10 9 11 15  38* 38*  CREATININE  --    < > 0.94   < > 1.06 0.96 1.13 1.1 1.2 1.2  CALCIUM  --    < > 8.8*   < > 8.5* 8.4* 8.5*  --   --   --   MG 1.6*  --  1.7  --   --   --  1.6*  --   --   --    < > = values in this interval not  displayed.   Recent Labs    08/09/17 09/08/17 0443 05/29/18 1559  AST 11* 13* 17  ALT 14 15* 15  ALKPHOS 92 70 147*  BILITOT  --  0.3 0.5  PROT  --  5.9* 6.1*  ALBUMIN  --  2.6* 2.6*   Recent Labs    09/08/17 0443  01/13/18 05/29/18 1559 05/31/18 0626 06/02/18 06/07/18  WBC 10.0   < > 9.4 10.0 9.7 10.2 9.2  NEUTROABS  --    < > 6  --   --  7 7  HGB 11.3*   < > 11.1* 9.6* 9.9* 9.4* 9.3*  HCT 34.6*   < > 34* 30.0* 31.6* 29* 29*  MCV 91.5  --   --  92.3 93.5  --   --   PLT 226   < > 263 320 315 311 318   < > = values in this interval not displayed.   Lab Results  Component Value Date   TSH 2.954 09/15/2015   Lab Results  Component Value Date   HGBA1C 9.7 01/13/2018   Lab Results  Component Value Date   CHOL 190 05/07/2017   HDL 38 05/07/2017   LDLCALC 92  05/07/2017   TRIG 299 (A) 05/07/2017   CHOLHDL 5.4 04/30/2015    Significant Diagnostic Results in last 30 days:  No results found.  Assessment/Plan   ICD-10-CM   1. Chills R68.83   2. Lethargy R53.83   3. Bullous pemphigus L10.9   4. Uncontrolled type II diabetes with peripheral autonomic neuropathy (HCC) E11.43    E11.65   5. High risk medication use Z79.899   6. Multiple open wounds T07.XXXA   7. Chronic pain syndrome G89.4     Cont current meds as ordered  Cont wound care as ordered  Cont nutritional supplements as indicated  [facility] NP to follow  Will follow  Labs/tests ordered: cbc w diff, cmp and UA with cx and s   Brayton Layman S. Perlie Gold  [facility] [address] [place], [place] [address] [phone] Cell (Monday-Friday 8 AM - 5 PM) [phone] After 5 PM and follow prompts

## 2018-07-01 LAB — BASIC METABOLIC PANEL
BUN: 22 — AB (ref 4–21)
Creatinine: 1 (ref 0.6–1.3)
Glucose: 207
Potassium: 4.8 (ref 3.4–5.3)
Sodium: 133 — AB (ref 137–147)

## 2018-07-01 LAB — CBC AND DIFFERENTIAL
HCT: 27 — AB (ref 41–53)
HEMOGLOBIN: 8.7 — AB (ref 13.5–17.5)
NEUTROS ABS: 9
PLATELETS: 266 (ref 150–399)
WBC: 11.2

## 2018-07-01 LAB — HEPATIC FUNCTION PANEL
ALK PHOS: 202 — AB (ref 25–125)
ALT: 16 (ref 10–40)
AST: 18 (ref 14–40)
BILIRUBIN, TOTAL: 0.2

## 2018-07-07 ENCOUNTER — Encounter: Payer: Self-pay | Admitting: Internal Medicine

## 2018-07-31 DIAGNOSIS — E1151 Type 2 diabetes mellitus with diabetic peripheral angiopathy without gangrene: Secondary | ICD-10-CM | POA: Diagnosis not present

## 2018-07-31 DIAGNOSIS — B351 Tinea unguium: Secondary | ICD-10-CM | POA: Diagnosis not present

## 2018-07-31 DIAGNOSIS — Z794 Long term (current) use of insulin: Secondary | ICD-10-CM | POA: Diagnosis not present

## 2018-10-29 ENCOUNTER — Ambulatory Visit
Admission: EM | Admit: 2018-10-29 | Discharge: 2018-10-29 | Discharge status: Absent without leave | Payer: Medicare Other

## 2019-09-05 ENCOUNTER — Encounter (HOSPITAL_COMMUNITY): Payer: Self-pay | Admitting: Emergency Medicine

## 2019-09-05 ENCOUNTER — Inpatient Hospital Stay (HOSPITAL_COMMUNITY): Payer: Medicare Other

## 2019-09-05 ENCOUNTER — Inpatient Hospital Stay (HOSPITAL_COMMUNITY)
Admission: EM | Admit: 2019-09-05 | Discharge: 2019-09-17 | DRG: 291 | Disposition: A | Discharge status: Hospice | Payer: Medicare Other | Source: Skilled Nursing Facility | Attending: Internal Medicine | Admitting: Internal Medicine

## 2019-09-05 ENCOUNTER — Other Ambulatory Visit: Payer: Self-pay

## 2019-09-05 ENCOUNTER — Emergency Department (HOSPITAL_COMMUNITY): Payer: Medicare Other

## 2019-09-05 DIAGNOSIS — N319 Neuromuscular dysfunction of bladder, unspecified: Secondary | ICD-10-CM | POA: Diagnosis present

## 2019-09-05 DIAGNOSIS — E1165 Type 2 diabetes mellitus with hyperglycemia: Secondary | ICD-10-CM | POA: Diagnosis present

## 2019-09-05 DIAGNOSIS — J9692 Respiratory failure, unspecified with hypercapnia: Secondary | ICD-10-CM | POA: Diagnosis present

## 2019-09-05 DIAGNOSIS — R0602 Shortness of breath: Secondary | ICD-10-CM | POA: Diagnosis present

## 2019-09-05 DIAGNOSIS — Z9889 Other specified postprocedural states: Secondary | ICD-10-CM

## 2019-09-05 DIAGNOSIS — E1142 Type 2 diabetes mellitus with diabetic polyneuropathy: Secondary | ICD-10-CM | POA: Diagnosis present

## 2019-09-05 DIAGNOSIS — Z936 Other artificial openings of urinary tract status: Secondary | ICD-10-CM

## 2019-09-05 DIAGNOSIS — R269 Unspecified abnormalities of gait and mobility: Secondary | ICD-10-CM | POA: Diagnosis present

## 2019-09-05 DIAGNOSIS — Z515 Encounter for palliative care: Secondary | ICD-10-CM | POA: Diagnosis present

## 2019-09-05 DIAGNOSIS — E874 Mixed disorder of acid-base balance: Secondary | ICD-10-CM | POA: Diagnosis not present

## 2019-09-05 DIAGNOSIS — I313 Pericardial effusion (noninflammatory): Secondary | ICD-10-CM | POA: Diagnosis present

## 2019-09-05 DIAGNOSIS — J9621 Acute and chronic respiratory failure with hypoxia: Secondary | ICD-10-CM | POA: Diagnosis present

## 2019-09-05 DIAGNOSIS — IMO0002 Reserved for concepts with insufficient information to code with codable children: Secondary | ICD-10-CM | POA: Diagnosis present

## 2019-09-05 DIAGNOSIS — I130 Hypertensive heart and chronic kidney disease with heart failure and stage 1 through stage 4 chronic kidney disease, or unspecified chronic kidney disease: Secondary | ICD-10-CM | POA: Diagnosis present

## 2019-09-05 DIAGNOSIS — M19042 Primary osteoarthritis, left hand: Secondary | ICD-10-CM | POA: Diagnosis present

## 2019-09-05 DIAGNOSIS — Z20822 Contact with and (suspected) exposure to covid-19: Secondary | ICD-10-CM | POA: Diagnosis present

## 2019-09-05 DIAGNOSIS — Z833 Family history of diabetes mellitus: Secondary | ICD-10-CM

## 2019-09-05 DIAGNOSIS — R0902 Hypoxemia: Secondary | ICD-10-CM

## 2019-09-05 DIAGNOSIS — Z7952 Long term (current) use of systemic steroids: Secondary | ICD-10-CM

## 2019-09-05 DIAGNOSIS — Z66 Do not resuscitate: Secondary | ICD-10-CM | POA: Diagnosis present

## 2019-09-05 DIAGNOSIS — E871 Hypo-osmolality and hyponatremia: Secondary | ICD-10-CM | POA: Diagnosis present

## 2019-09-05 DIAGNOSIS — F419 Anxiety disorder, unspecified: Secondary | ICD-10-CM | POA: Diagnosis present

## 2019-09-05 DIAGNOSIS — J9690 Respiratory failure, unspecified, unspecified whether with hypoxia or hypercapnia: Secondary | ICD-10-CM

## 2019-09-05 DIAGNOSIS — R531 Weakness: Secondary | ICD-10-CM

## 2019-09-05 DIAGNOSIS — I2510 Atherosclerotic heart disease of native coronary artery without angina pectoris: Secondary | ICD-10-CM | POA: Diagnosis present

## 2019-09-05 DIAGNOSIS — D631 Anemia in chronic kidney disease: Secondary | ICD-10-CM | POA: Diagnosis present

## 2019-09-05 DIAGNOSIS — Z96651 Presence of right artificial knee joint: Secondary | ICD-10-CM | POA: Diagnosis present

## 2019-09-05 DIAGNOSIS — Z885 Allergy status to narcotic agent status: Secondary | ICD-10-CM

## 2019-09-05 DIAGNOSIS — J440 Chronic obstructive pulmonary disease with acute lower respiratory infection: Secondary | ICD-10-CM | POA: Diagnosis present

## 2019-09-05 DIAGNOSIS — E785 Hyperlipidemia, unspecified: Secondary | ICD-10-CM | POA: Diagnosis present

## 2019-09-05 DIAGNOSIS — Z6841 Body Mass Index (BMI) 40.0 and over, adult: Secondary | ICD-10-CM | POA: Diagnosis not present

## 2019-09-05 DIAGNOSIS — Z794 Long term (current) use of insulin: Secondary | ICD-10-CM

## 2019-09-05 DIAGNOSIS — Z9981 Dependence on supplemental oxygen: Secondary | ICD-10-CM

## 2019-09-05 DIAGNOSIS — N179 Acute kidney failure, unspecified: Secondary | ICD-10-CM | POA: Diagnosis present

## 2019-09-05 DIAGNOSIS — E878 Other disorders of electrolyte and fluid balance, not elsewhere classified: Secondary | ICD-10-CM

## 2019-09-05 DIAGNOSIS — E039 Hypothyroidism, unspecified: Secondary | ICD-10-CM | POA: Diagnosis present

## 2019-09-05 DIAGNOSIS — G2581 Restless legs syndrome: Secondary | ICD-10-CM | POA: Diagnosis present

## 2019-09-05 DIAGNOSIS — G92 Toxic encephalopathy: Secondary | ICD-10-CM | POA: Diagnosis present

## 2019-09-05 DIAGNOSIS — Z8616 Personal history of COVID-19: Secondary | ICD-10-CM

## 2019-09-05 DIAGNOSIS — Z87440 Personal history of urinary (tract) infections: Secondary | ICD-10-CM

## 2019-09-05 DIAGNOSIS — G4733 Obstructive sleep apnea (adult) (pediatric): Secondary | ICD-10-CM | POA: Diagnosis present

## 2019-09-05 DIAGNOSIS — J9601 Acute respiratory failure with hypoxia: Secondary | ICD-10-CM | POA: Diagnosis present

## 2019-09-05 DIAGNOSIS — R112 Nausea with vomiting, unspecified: Secondary | ICD-10-CM

## 2019-09-05 DIAGNOSIS — Z79891 Long term (current) use of opiate analgesic: Secondary | ICD-10-CM

## 2019-09-05 DIAGNOSIS — Z888 Allergy status to other drugs, medicaments and biological substances status: Secondary | ICD-10-CM

## 2019-09-05 DIAGNOSIS — Z978 Presence of other specified devices: Secondary | ICD-10-CM

## 2019-09-05 DIAGNOSIS — M79609 Pain in unspecified limb: Secondary | ICD-10-CM | POA: Diagnosis not present

## 2019-09-05 DIAGNOSIS — M545 Low back pain: Secondary | ICD-10-CM | POA: Diagnosis present

## 2019-09-05 DIAGNOSIS — J9622 Acute and chronic respiratory failure with hypercapnia: Secondary | ICD-10-CM | POA: Diagnosis present

## 2019-09-05 DIAGNOSIS — Z8701 Personal history of pneumonia (recurrent): Secondary | ICD-10-CM

## 2019-09-05 DIAGNOSIS — I080 Rheumatic disorders of both mitral and aortic valves: Secondary | ICD-10-CM | POA: Diagnosis present

## 2019-09-05 DIAGNOSIS — Z86711 Personal history of pulmonary embolism: Secondary | ICD-10-CM

## 2019-09-05 DIAGNOSIS — I5043 Acute on chronic combined systolic (congestive) and diastolic (congestive) heart failure: Secondary | ICD-10-CM | POA: Diagnosis present

## 2019-09-05 DIAGNOSIS — Z7189 Other specified counseling: Secondary | ICD-10-CM

## 2019-09-05 DIAGNOSIS — E1122 Type 2 diabetes mellitus with diabetic chronic kidney disease: Secondary | ICD-10-CM | POA: Diagnosis present

## 2019-09-05 DIAGNOSIS — L120 Bullous pemphigoid: Secondary | ICD-10-CM | POA: Diagnosis present

## 2019-09-05 DIAGNOSIS — M19041 Primary osteoarthritis, right hand: Secondary | ICD-10-CM | POA: Diagnosis present

## 2019-09-05 DIAGNOSIS — K219 Gastro-esophageal reflux disease without esophagitis: Secondary | ICD-10-CM | POA: Diagnosis present

## 2019-09-05 DIAGNOSIS — Z8249 Family history of ischemic heart disease and other diseases of the circulatory system: Secondary | ICD-10-CM

## 2019-09-05 DIAGNOSIS — B372 Candidiasis of skin and nail: Secondary | ICD-10-CM | POA: Diagnosis present

## 2019-09-05 DIAGNOSIS — R54 Age-related physical debility: Secondary | ICD-10-CM | POA: Diagnosis present

## 2019-09-05 DIAGNOSIS — J90 Pleural effusion, not elsewhere classified: Secondary | ICD-10-CM

## 2019-09-05 DIAGNOSIS — I4891 Unspecified atrial fibrillation: Secondary | ICD-10-CM

## 2019-09-05 DIAGNOSIS — F329 Major depressive disorder, single episode, unspecified: Secondary | ICD-10-CM | POA: Diagnosis present

## 2019-09-05 DIAGNOSIS — N184 Chronic kidney disease, stage 4 (severe): Secondary | ICD-10-CM | POA: Diagnosis present

## 2019-09-05 DIAGNOSIS — E1143 Type 2 diabetes mellitus with diabetic autonomic (poly)neuropathy: Secondary | ICD-10-CM | POA: Diagnosis present

## 2019-09-05 DIAGNOSIS — Z7901 Long term (current) use of anticoagulants: Secondary | ICD-10-CM

## 2019-09-05 DIAGNOSIS — G894 Chronic pain syndrome: Secondary | ICD-10-CM | POA: Diagnosis present

## 2019-09-05 DIAGNOSIS — I5032 Chronic diastolic (congestive) heart failure: Secondary | ICD-10-CM | POA: Diagnosis present

## 2019-09-05 DIAGNOSIS — E875 Hyperkalemia: Secondary | ICD-10-CM | POA: Diagnosis present

## 2019-09-05 DIAGNOSIS — M19071 Primary osteoarthritis, right ankle and foot: Secondary | ICD-10-CM | POA: Diagnosis present

## 2019-09-05 DIAGNOSIS — Z8673 Personal history of transient ischemic attack (TIA), and cerebral infarction without residual deficits: Secondary | ICD-10-CM

## 2019-09-05 DIAGNOSIS — I5082 Biventricular heart failure: Secondary | ICD-10-CM | POA: Diagnosis present

## 2019-09-05 DIAGNOSIS — E872 Acidosis: Secondary | ICD-10-CM

## 2019-09-05 DIAGNOSIS — Z86718 Personal history of other venous thrombosis and embolism: Secondary | ICD-10-CM

## 2019-09-05 DIAGNOSIS — E8729 Other acidosis: Secondary | ICD-10-CM

## 2019-09-05 DIAGNOSIS — J189 Pneumonia, unspecified organism: Secondary | ICD-10-CM | POA: Diagnosis present

## 2019-09-05 DIAGNOSIS — Z87891 Personal history of nicotine dependence: Secondary | ICD-10-CM

## 2019-09-05 DIAGNOSIS — M19072 Primary osteoarthritis, left ankle and foot: Secondary | ICD-10-CM | POA: Diagnosis present

## 2019-09-05 DIAGNOSIS — E8809 Other disorders of plasma-protein metabolism, not elsewhere classified: Secondary | ICD-10-CM | POA: Diagnosis present

## 2019-09-05 DIAGNOSIS — N189 Chronic kidney disease, unspecified: Secondary | ICD-10-CM | POA: Diagnosis not present

## 2019-09-05 DIAGNOSIS — K1379 Other lesions of oral mucosa: Secondary | ICD-10-CM | POA: Diagnosis not present

## 2019-09-05 DIAGNOSIS — I428 Other cardiomyopathies: Secondary | ICD-10-CM | POA: Diagnosis present

## 2019-09-05 DIAGNOSIS — Z79899 Other long term (current) drug therapy: Secondary | ICD-10-CM

## 2019-09-05 DIAGNOSIS — R34 Anuria and oliguria: Secondary | ICD-10-CM | POA: Diagnosis not present

## 2019-09-05 DIAGNOSIS — Z7401 Bed confinement status: Secondary | ICD-10-CM

## 2019-09-05 DIAGNOSIS — L89152 Pressure ulcer of sacral region, stage 2: Secondary | ICD-10-CM | POA: Diagnosis present

## 2019-09-05 DIAGNOSIS — M7989 Other specified soft tissue disorders: Secondary | ICD-10-CM | POA: Diagnosis not present

## 2019-09-05 LAB — COMPREHENSIVE METABOLIC PANEL
ALT: 10 U/L (ref 0–44)
AST: 10 U/L — ABNORMAL LOW (ref 15–41)
Albumin: 2.3 g/dL — ABNORMAL LOW (ref 3.5–5.0)
Alkaline Phosphatase: 103 U/L (ref 38–126)
Anion gap: 7 (ref 5–15)
BUN: 39 mg/dL — ABNORMAL HIGH (ref 8–23)
CO2: 31 mmol/L (ref 22–32)
Calcium: 8.4 mg/dL — ABNORMAL LOW (ref 8.9–10.3)
Chloride: 87 mmol/L — ABNORMAL LOW (ref 98–111)
Creatinine, Ser: 1.38 mg/dL — ABNORMAL HIGH (ref 0.61–1.24)
GFR calc Af Amer: 59 mL/min — ABNORMAL LOW (ref >60)
GFR calc non Af Amer: 51 mL/min — ABNORMAL LOW (ref >60)
Glucose, Bld: 235 mg/dL — ABNORMAL HIGH (ref 70–99)
Potassium: 5.4 mmol/L — ABNORMAL HIGH (ref 3.5–5.1)
Sodium: 125 mmol/L — ABNORMAL LOW (ref 135–145)
Total Bilirubin: 0.7 mg/dL (ref 0.3–1.2)
Total Protein: 5.8 g/dL — ABNORMAL LOW (ref 6.5–8.1)

## 2019-09-05 LAB — BLOOD GAS, ARTERIAL
Acid-Base Excess: 3.7 mmol/L — ABNORMAL HIGH (ref 0.0–2.0)
Bicarbonate: 31.7 mmol/L — ABNORMAL HIGH (ref 20.0–28.0)
O2 Saturation: 97.7 %
Patient temperature: 98.6
pCO2 arterial: 73 mmHg (ref 32.0–48.0)
pH, Arterial: 7.261 — ABNORMAL LOW (ref 7.350–7.450)
pO2, Arterial: 106 mmHg (ref 83.0–108.0)

## 2019-09-05 LAB — BASIC METABOLIC PANEL
Anion gap: 10 (ref 5–15)
BUN: 40 mg/dL — ABNORMAL HIGH (ref 8–23)
CO2: 30 mmol/L (ref 22–32)
Calcium: 8.6 mg/dL — ABNORMAL LOW (ref 8.9–10.3)
Chloride: 86 mmol/L — ABNORMAL LOW (ref 98–111)
Creatinine, Ser: 1.54 mg/dL — ABNORMAL HIGH (ref 0.61–1.24)
GFR calc Af Amer: 52 mL/min — ABNORMAL LOW (ref >60)
GFR calc non Af Amer: 45 mL/min — ABNORMAL LOW (ref >60)
Glucose, Bld: 250 mg/dL — ABNORMAL HIGH (ref 70–99)
Potassium: 5.4 mmol/L — ABNORMAL HIGH (ref 3.5–5.1)
Sodium: 126 mmol/L — ABNORMAL LOW (ref 135–145)

## 2019-09-05 LAB — PROTIME-INR
INR: 1.4 — ABNORMAL HIGH (ref 0.8–1.2)
Prothrombin Time: 16.8 s — ABNORMAL HIGH (ref 11.4–15.2)

## 2019-09-05 LAB — CBC
HCT: 34.8 % — ABNORMAL LOW (ref 39.0–52.0)
Hemoglobin: 10.4 g/dL — ABNORMAL LOW (ref 13.0–17.0)
MCH: 27.4 pg (ref 26.0–34.0)
MCHC: 29.9 g/dL — ABNORMAL LOW (ref 30.0–36.0)
MCV: 91.8 fL (ref 80.0–100.0)
Platelets: 329 10*3/uL (ref 150–400)
RBC: 3.79 MIL/uL — ABNORMAL LOW (ref 4.22–5.81)
RDW: 16.1 % — ABNORMAL HIGH (ref 11.5–15.5)
WBC: 9.7 10*3/uL (ref 4.0–10.5)
nRBC: 0 % (ref 0.0–0.2)

## 2019-09-05 LAB — APTT: aPTT: 39 seconds — ABNORMAL HIGH (ref 24–36)

## 2019-09-05 LAB — TROPONIN I (HIGH SENSITIVITY)
Troponin I (High Sensitivity): 9 ng/L (ref <18)
Troponin I (High Sensitivity): 10 ng/L (ref <18)

## 2019-09-05 LAB — BRAIN NATRIURETIC PEPTIDE
B Natriuretic Peptide: 261.4 pg/mL — ABNORMAL HIGH (ref 0.0–100.0)
B Natriuretic Peptide: 204.6 pg/mL — ABNORMAL HIGH (ref 0.0–100.0)

## 2019-09-05 LAB — PROCALCITONIN: Procalcitonin: 0.12 ng/mL

## 2019-09-05 LAB — HEPARIN LEVEL (UNFRACTIONATED): Heparin Unfractionated: 0.69 IU/mL (ref 0.30–0.70)

## 2019-09-05 LAB — LACTIC ACID, PLASMA: Lactic Acid, Venous: 1.1 mmol/L (ref 0.5–1.9)

## 2019-09-05 LAB — CBG MONITORING, ED: Glucose-Capillary: 205 mg/dL — ABNORMAL HIGH (ref 70–99)

## 2019-09-05 LAB — ABO/RH: ABO/RH(D): A POS

## 2019-09-05 LAB — POC SARS CORONAVIRUS 2 AG -  ED: SARS Coronavirus 2 Ag: NEGATIVE

## 2019-09-05 MED ORDER — INSULIN DETEMIR 100 UNIT/ML ~~LOC~~ SOLN
25.0000 [IU] | Freq: Every day | SUBCUTANEOUS | Status: DC
  Filled 2019-09-05: qty 0.25

## 2019-09-05 MED ORDER — FUROSEMIDE 10 MG/ML IJ SOLN
80.0000 mg | Freq: Once | INTRAMUSCULAR | Status: AC
  Administered 2019-09-05: 80 mg via INTRAVENOUS

## 2019-09-05 MED ORDER — INSULIN GLARGINE 100 UNIT/ML ~~LOC~~ SOLN
20.0000 [IU] | Freq: Two times a day (BID) | SUBCUTANEOUS | Status: DC
  Administered 2019-09-06: 20 [IU] via SUBCUTANEOUS
  Filled 2019-09-05 (×2): qty 0.2

## 2019-09-05 MED ORDER — PREDNISONE 20 MG PO TABS
10.0000 mg | ORAL_TABLET | Freq: Every day | ORAL | Status: DC
  Administered 2019-09-06: 10 mg via ORAL
  Filled 2019-09-05: qty 1

## 2019-09-05 MED ORDER — ARFORMOTEROL TARTRATE 15 MCG/2ML IN NEBU
15.0000 ug | INHALATION_SOLUTION | Freq: Two times a day (BID) | RESPIRATORY_TRACT | Status: DC
  Administered 2019-09-06 – 2019-09-17 (×23): 15 ug via RESPIRATORY_TRACT
  Filled 2019-09-05 (×26): qty 2

## 2019-09-05 MED ORDER — VANCOMYCIN HCL 2000 MG/400ML IV SOLN
2000.0000 mg | Freq: Once | INTRAVENOUS | Status: AC
  Administered 2019-09-05: 2000 mg via INTRAVENOUS
  Filled 2019-09-05: qty 400

## 2019-09-05 MED ORDER — DEXAMETHASONE SODIUM PHOSPHATE 10 MG/ML IJ SOLN
6.0000 mg | Freq: Every day | INTRAMUSCULAR | Status: DC
  Administered 2019-09-06 (×2): 6 mg via INTRAVENOUS
  Filled 2019-09-05 (×2): qty 1

## 2019-09-05 MED ORDER — SODIUM CHLORIDE 0.9 % IV SOLN: INTRAVENOUS | Status: AC

## 2019-09-05 MED ORDER — HEPARIN (PORCINE) 25000 UT/250ML-% IV SOLN
2000.0000 [IU]/h | INTRAVENOUS | Status: DC
  Administered 2019-09-06: 2000 [IU]/h via INTRAVENOUS
  Filled 2019-09-05: qty 250

## 2019-09-05 MED ORDER — VANCOMYCIN HCL 1750 MG/350ML IV SOLN
1750.0000 mg | INTRAVENOUS | Status: DC
  Administered 2019-09-06 – 2019-09-07 (×2): 1750 mg via INTRAVENOUS
  Filled 2019-09-05 (×2): qty 350

## 2019-09-05 MED ORDER — INSULIN ASPART 100 UNIT/ML ~~LOC~~ SOLN
0.0000 [IU] | Freq: Four times a day (QID) | SUBCUTANEOUS | Status: DC
  Filled 2019-09-05: qty 0.15

## 2019-09-05 MED ORDER — BUDESONIDE 0.25 MG/2ML IN SUSP
0.2500 mg | Freq: Two times a day (BID) | RESPIRATORY_TRACT | Status: DC
  Administered 2019-09-06 – 2019-09-17 (×23): 0.25 mg via RESPIRATORY_TRACT
  Filled 2019-09-05 (×23): qty 2

## 2019-09-05 MED ORDER — SODIUM CHLORIDE 0.9 % IV SOLN
200.0000 mg | Freq: Once | INTRAVENOUS | Status: AC
  Administered 2019-09-05: 200 mg via INTRAVENOUS
  Filled 2019-09-05: qty 200

## 2019-09-05 MED ORDER — SODIUM CHLORIDE 0.9 % IV SOLN
2.0000 g | Freq: Three times a day (TID) | INTRAVENOUS | Status: DC
  Administered 2019-09-05 – 2019-09-07 (×5): 2 g via INTRAVENOUS
  Filled 2019-09-05 (×5): qty 2

## 2019-09-05 MED ORDER — INSULIN ASPART 100 UNIT/ML ~~LOC~~ SOLN
0.0000 [IU] | SUBCUTANEOUS | Status: DC
  Administered 2019-09-05: 7 [IU] via SUBCUTANEOUS
  Administered 2019-09-06: 3 [IU] via SUBCUTANEOUS
  Administered 2019-09-06: 4 [IU] via SUBCUTANEOUS
  Administered 2019-09-06 (×3): 3 [IU] via SUBCUTANEOUS
  Administered 2019-09-06: 7 [IU] via SUBCUTANEOUS
  Administered 2019-09-07: 04:00:00 3 [IU] via SUBCUTANEOUS
  Administered 2019-09-07 (×3): 4 [IU] via SUBCUTANEOUS
  Administered 2019-09-07: 3 [IU] via SUBCUTANEOUS
  Administered 2019-09-08: 4 [IU] via SUBCUTANEOUS
  Administered 2019-09-08: 3 [IU] via SUBCUTANEOUS
  Administered 2019-09-08: 4 [IU] via SUBCUTANEOUS
  Filled 2019-09-05: qty 0.2

## 2019-09-05 MED ORDER — ENOXAPARIN SODIUM 40 MG/0.4ML ~~LOC~~ SOLN: 40.0000 mg | Freq: Two times a day (BID) | SUBCUTANEOUS | Status: DC

## 2019-09-05 NOTE — Consult Note (Deleted)
NAME:  [name], MRN:  [id number], DOB:  Jan 26, 1946, LOS: 0 ADMISSION DATE:  09/05/2019, CONSULTATION DATE:  09/05/19 REFERRING MD:  Orpah Melter, CHIEF COMPLAINT:  Hxypoxia  Brief History   73 yo male with history of recently positive covid test noted to have hypoxia at SNF. Noted to be altered and hypercapnic on arrival to ED.       History of present illness   73 yo male with history of obseity bullous pemphigoid, neurogenic bladder, chornic indwelling foley, T2DM, HTN, PE on xarelto, HFpEF, chronic hypoxic respiratory fialure on 2 liters at home who presents for low saturations. Was at rehab facility when they noted this. Patient had previously denied feeling SOB. Was noted to to be sleepy as well. In ED  Patient noted to have hypercapnia with ph of 7.26 and CO2 of 73. Also noted to have slightly elevated BNP of 261.  NA of 126. Creatinine elevated above baseline which is usually around 1.2. Here it is 1.54. Troponins normal. CXR notable for left pleural effusion. And white out of right hemithorax.   Past Medical History  Bullous pemphigoid DM2 HTN PE-on chronic xarelot Obesity HFpEF   Significant Hospital Events   Admitted to floor for hypercapnic respiratory failure  Consults:  Critical care  Procedures:  None at this time  Significant Diagnostic Tests:  Thoracentesis  Micro Data:  Pending  Antimicrobials:  Vanc and cefpeime  Interim history/subjective:  NA  Objective   Blood pressure 130/84, pulse 67, temperature (!) 97.4 F (36.3 C), temperature source Axillary, resp. rate 19, height 6\' 2"  (1.88 m), weight (!) 157 kg, SpO2 100 %.    Vent Mode: PCV FiO2 (%):  [30 %] 30 % Set Rate:  [15 bmp] 15 bmp PEEP:  [5 cmH20] 5 cmH20  No intake or output data in the 24 hours ending 09/05/19 1950 Filed Weights   09/05/19 1317  Weight: (!) 157 kg    Examination: General: Patient lethargic and unable to coopereate HENT: Moist mucous membranes Lungs distant  with crackles no wheeze Cardiovascular: Irregular and brady. Pitting edema in lower extgremities Abdomen: Soft with some edema Extremities: Signs of veonus stasis in legs and edema noted Neuro: Not following commands. Not oriented GU: Suprapubic foley in place  Resolved Hospital Problem list   NA  Assessment & Plan:  This is a 73 yo male with history as noted above who presents for chief complaint low sats. Has maybe tested positive for COVID inm the past. Brother notes he was positive 3 weeks ago    Hypercapnic respiratory failure-Likely mixed etiology. Heart failure and COPD. Think heart failure main culprit with new efrfusion and hyponatremia -Diurese as blood pressure allows with lasix -Prednisone 10 mg continue from home for Bullous pemphigoid -Duonebs scheduled -Therapeutic thoracentesis deferred for now as likely secondary to overload and logsitically complicated overnight. So not think this will improve hypercapnia. for effusion -Bipap for the night -NPO while on Bipap -FU covid -Start Covid treatement with remdesivier and decadron if positive -Continue AC as well for PE -Would start broad spectrum abx with vanc and cefepime -FU cx's -Remdesivir  Covid-Patient positive 3 weeks ago per brother. Did not need treatment for this per brother -Remdesivir -Decadron -Broad spectrum abx -FU repeat covid. And can track down date of covid test in AM  Encephalotphy-Multifactorial-Hypercapnis could play a part as well as hyponatremia and sepsis. Also more uremic than usual with AKI -Correct metabolics with diuresis as renal function tolerates -Broad spectrum abx  for possible infection  DM2 on 85 QHS at home. Lower dose as patient NPO and altered -Lantus BID -correctional   HTN-Holding metoprolol and norvasc from home   Pleura effusion-Will need thora in future. Given body habitus and no ancillary help in ED it is not logistically feasible to perform safely at this time.  Brother aware this may need to be done -Can perform when patient comes up to ICU and bedside staff more willing to take part  PE-Noted previous. On home xarelto -Hold Xarelto in setting of AKI and start heparin  Bullous pemphigoid-On chronic steroids -Continue prednisone     Best practice:  Diet: NPO for now Pain/Anxiety/Delirium protocol (if indicated): NA VAP protocol (if indicated): NA DVT prophylaxis: Secundino Ginger GI prophylaxis: NA Glucose control: Insulin per hospitalist Mobility:NA Code Status: DNR per DNR form at bedside Family Communication: Per hospitalist Disposition: Floor  Labs   CBC: Recent Labs  Lab 09/05/19 1336  WBC 9.7  HGB 10.4*  HCT 34.8*  MCV 91.8  PLT 254    Basic Metabolic Panel: Recent Labs  Lab 09/05/19 1336  NA 126*  K 5.4*  CL 86*  CO2 30  GLUCOSE 250*  BUN 40*  CREATININE 1.54*  CALCIUM 8.6*   GFR: Estimated Creatinine Clearance: 69.8 mL/min (A) (by C-G formula based on SCr of 1.54 mg/dL (H)). Recent Labs  Lab 09/05/19 1336  WBC 9.7    Liver Function Tests: No results for input(s): AST, ALT, ALKPHOS, BILITOT, PROT, ALBUMIN in the last 168 hours. No results for input(s): LIPASE, AMYLASE in the last 168 hours. No results for input(s): AMMONIA in the last 168 hours.  ABG    Component Value Date/Time   PHART 7.261 (L) 09/05/2019 1747   PCO2ART 73.0 (HH) 09/05/2019 1747   PO2ART 106 09/05/2019 1747   HCO3 31.7 (H) 09/05/2019 1747   TCO2 31 05/27/2016 0749   O2SAT 97.7 09/05/2019 1747     Coagulation Profile: No results for input(s): INR, PROTIME in the last 168 hours.  Cardiac Enzymes: No results for input(s): CKTOTAL, CKMB, CKMBINDEX, TROPONINI in the last 168 hours.  HbA1C: Hemoglobin A1C  Date/Time Value Ref Range Status  01/13/2018 12:00 AM 9.7  Final  10/25/2017 12:00 AM 9.8  Final    CBG: No results for input(s): GLUCAP in the last 168 hours.  Review of Systems:   Unable to attain as patient  altered  Past Medical History  He,  has a past medical history of Arthritis, Bladder spasms, CAD (coronary artery disease), CAD in native artery (09/15/2015), CHF (congestive heart failure) ([place]), Chronic indwelling Foley catheter, Chronic lower back pain, Depression, Diabetic diarrhea ([place]), Diabetic neuropathy, painful ([place]), DVT (deep venous thrombosis) ([place]) (1980's), GERD (gastroesophageal reflux disease), Hyperlipidemia, Hypertension, Neurogenic bladder, Obese, OSA (obstructive sleep apnea), Pneumonia (1999), PONV (postoperative nausea and vomiting), Stroke ([place]) (10/2011; 06/2014), Type II diabetes mellitus ([place]), and Uncontrolled type II diabetes with peripheral autonomic neuropathy ([place]) (05/09/2015).   Surgical History    Past Surgical History:  Procedure Laterality Date  . CARDIAC CATHETERIZATION N/A 05/02/2015   Procedure: Left Heart Cath and Coronary Angiography;  Surgeon: Lorretta Harp, MD;  Location: [facility];  Service: Cardiovascular;  Laterality: N/A;  . CARDIAC CATHETERIZATION N/A 05/04/2015   Procedure: Left Heart Cath and Coronary Angiography;  Surgeon: Wellington Hampshire, MD;  Location: [facility];  Service: Cardiovascular;  Laterality: N/A;  . CARPAL TUNNEL RELEASE Bilateral ~ 2003-2004  . CHOLECYSTECTOMY OPEN  1970's?  Marland Kitchen  GASTROPLASTY    . JOINT REPLACEMENT    . LEFT HEART CATHETERIZATION WITH CORONARY ANGIOGRAM N/A 10/24/2011   Procedure: LEFT HEART CATHETERIZATION WITH CORONARY ANGIOGRAM;  Surgeon: Candee Furbish, MD;  Location: [facility] CATH LAB;  Service: Cardiovascular;  Laterality: N/A;  right radial artery approach  . TOTAL KNEE ARTHROPLASTY Right 1990's     Social History   reports that he quit smoking about 43 years ago. His smoking use included cigarettes. He has a 40.00 pack-year smoking history. He has never used smokeless tobacco. He reports that he does not drink alcohol or use drugs.   Family History   His family history includes Cancer in his brother  and brother; Diabetes type I in his father; Hypertension in his mother.   Allergies Allergies  Allergen Reactions  . Ace Inhibitors Swelling    Pt tolerates lisinopril  . Lipitor [Atorvastatin Calcium] Swelling  . Metformin And Related Swelling  . Morphine And Related Other (See Comments)    Sweating, feels like is "in rocky boat."  . Robaxin [Methocarbamol] Other (See Comments)    Feels like he is shaky     Home Medications  Prior to Admission medications   Medication Sig Start Date End Date Taking? Authorizing Provider  acetaminophen (TYLENOL) 500 MG tablet Take 1,000 mg by mouth every 8 (eight) hours as needed.  01/10/18   [provider]  aluminum-magnesium hydroxide-simethicone (MAALOX) 032-122-48 MG/5ML SUSP Take 30 mLs by mouth every 6 (six) hours as needed (indigestion).     [provider]  amLODipine (NORVASC) 5 MG tablet Take 5 mg by mouth daily.    [provider]  bisacodyl (DULCOLAX) 10 MG suppository Place 10 mg rectally daily as needed for moderate constipation. 04/19/18   [provider]  Cholecalciferol 1000 units tablet Take 1,000 Units by mouth daily. 12/05/17   [provider]  diclofenac sodium (VOLTAREN) 1 % GEL Apply topically 3 (three) times daily. Apply to left shoulder topically three times daily     [provider]  escitalopram (LEXAPRO) 5 MG tablet Take 5 mg by mouth daily.    [provider]  ezetimibe (ZETIA) 10 MG tablet Take 10 mg by mouth daily.    [provider]  folic acid (FOLVITE) 1 MG tablet Take 1 mg by mouth daily.    [provider]  gabapentin (NEURONTIN) 100 MG capsule Take 200 mg by mouth 2 (two) times daily.    [provider]  guaiFENesin (MUCINEX) 600 MG 12 hr tablet Take 600 mg by mouth 2 (two) times daily as needed for cough.     [provider]  hydrOXYzine (ATARAX/VISTARIL) 25 MG tablet Take 25 mg by mouth 3 (three) times daily.      [provider]  Insulin Glargine (TOUJEO SOLOSTAR) 300 UNIT/ML SOPN Inject 85 Units into the skin at bedtime.  03/06/18   [provider]  insulin lispro (HUMALOG) 100 UNIT/ML injection Inject 25 Units into the skin 3 (three) times daily with meals. Also inject 8 units subcutaneously every 8 hours as needed for CBG greater than 300 03/06/18   [provider]  ipratropium-albuterol (DUONEB) 0.5-2.5 (3) MG/3ML SOLN Take 3 mLs by nebulization every 6 (six) hours as needed (sob).     [provider]  linagliptin (TRADJENTA) 5 MG TABS tablet Take 5 mg by mouth daily.    [provider]  methotrexate (RHEUMATREX) 10 MG tablet Take 10 mg by mouth every Wednesday. Caution: Chemotherapy. Protect  from light.    [provider]  metoprolol succinate (TOPROL-XL) 25 MG 24 hr tablet Take 25 mg by mouth daily.    [provider]  Multiple Vitamin (MULTIVITAMIN) tablet Take 1 tablet by mouth daily.    [provider]  nitroGLYCERIN (NITROSTAT) 0.4 MG SL tablet Place 0.4 mg under the tongue every 5 (five) minutes as needed for chest pain.    [provider]  NON FORMULARY Low concentrated sweets diet - Regular texture, NAS (no added salt)    [provider]  omeprazole (PRILOSEC) 20 MG capsule Take 20 mg by mouth daily.    [provider]  ondansetron (ZOFRAN) 4 MG tablet Take 4 mg by mouth every 6 (six) hours as needed for nausea or vomiting. 11/15/17   [provider]  OXYGEN Inhale into the lungs. O2 at 2l/min via nasal canula as needed for shortness of breath    [provider]  polyethylene glycol (MIRALAX / GLYCOLAX) packet Take 17 g by mouth daily as needed for mild constipation or moderate constipation. 04/19/18   [provider]  predniSONE (DELTASONE) 10 MG tablet Take 10 mg by mouth daily.    [provider]  rivaroxaban (XARELTO) 10 MG TABS tablet Take 10 mg by mouth daily.     [provider]  rOPINIRole (REQUIP) 0.5 MG tablet Take 0.5 mg by mouth at bedtime. 2100    [provider]  sennosides-docusate sodium (SENOKOT-S) 8.6-50 MG tablet Take 2 tablets by mouth at bedtime. 2100    [provider]  simethicone (MYLICON) 938 MG chewable tablet Chew 125 mg by mouth every 6 (six) hours as needed for flatulence. 03/19/18   [provider]  torsemide (DEMADEX) 20 MG tablet Take 20 mg by mouth daily.     [provider]  traMADol (ULTRAM) 50 MG tablet Take 2 tablets (100 mg total) by mouth 3 (three) times daily. Hold if Lethargic or confused 01/13/18   Gildardo Cranker, DO  traZODone (DESYREL) 100 MG tablet Take 100 mg by mouth at bedtime.  04/04/18   [provider]  triamcinolone cream (KENALOG) 0.1 % Apply 1 application topically 2 (two) times daily.    [provider]  vitamin C (ASCORBIC ACID) 500 MG tablet Take 500 mg by mouth 2 (two) times daily.    [provider]     Critical care time: 60 minutes

## 2019-09-05 NOTE — H&P (Signed)
History and Physical    [name] BSJ:[id number] DOB: 10-Sep-1946 DOA: 09/05/2019  PCP: Patient, No Pcp Per   Patient coming from: NH  Chief Complaint: Shortness of breath  HPI: [name] is a 73 y.o. 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  Patient normally on 2 L nasal cannula and was placed on 4 L at facility to bring of the oxygen and for EMS tach to go up to 8 L and titrated down to 5 L on arrival to the ER.  Per EDP stated pt was positive for Covid on 12/9-but on further discussion the ER doctor apparently patient was tested recently and the results are not back yet.  Covid is being tested now and pending Earlier in the ER he denied having any shortness of breath or any complaint.  On exam patient is lethargic/sleepy briefly wakes up and goes back to sleep.  He is on 2 L nasal cannula and saturating at 99 to 100%  ED Course: Blood pressure is stable, oxygen was titrated down to 3 L nasal cannula and was saturating 99 to 100%, work significant for hyponatremia sodium 126, hyperkalemia potassium 5.4, glucose uncontrolled 250 BUN/creatinine 40/1.5, BNP 261, troponin 9->10. Chest x-ray showed "New large left pleural effusion with atelectasis and consolidation in the left lung as described. Possible faint infiltrate in the right upper lobe".  Due to his lethargy ABG was ordered  Review of Systems: Unable to obtain full review of systems due to patient's mental status.   Past Medical History:  Diagnosis Date  . Arthritis    "hands,  ankles, knees" (11/10/2014)  . Bladder spasms   . CAD (coronary artery disease)   . CAD in native artery 09/15/2015  . CHF (congestive heart failure) ([place])   . Chronic indwelling Foley catheter   . Chronic lower back pain   . Depression   . Diabetic diarrhea ([place])   . Diabetic neuropathy, painful ([place])   . DVT (deep venous thrombosis) ([facility]) 1980's   LLE  . GERD (gastroesophageal reflux disease)   . Hyperlipidemia   . Hypertension   . Neurogenic bladder   . Obese   . OSA (obstructive sleep apnea)    "they wanted me to wear a mask; I couldn't" (11/10/2014)  . Pneumonia 1999  . PONV (postoperative nausea and vomiting)   . Stroke [facility]) 10/2011; 06/2014   right hand numbness/notes 10/20/2011, pt not sure he had stroke in 2013; "right hand weaker and right face not quite right since" (11/10/2014)  . Type II diabetes mellitus ([place])   . Uncontrolled type II diabetes with peripheral autonomic neuropathy ([place]) 05/09/2015    Past Surgical History:  Procedure Laterality Date  . CARDIAC CATHETERIZATION N/A 05/02/2015   Procedure: Left Heart Cath and Coronary Angiography;  Surgeon: Lorretta Harp, MD;  Location: [place] [facility];  Service: Cardiovascular;  Laterality: N/A;  . CARDIAC CATHETERIZATION N/A 05/04/2015   Procedure: Left Heart Cath and Coronary Angiography;  Surgeon: Wellington Hampshire, MD;  Location: [place] [facility];  Service: Cardiovascular;  Laterality: N/A;  . CARPAL TUNNEL RELEASE Bilateral ~ 2003-2004  .  CHOLECYSTECTOMY OPEN  1970's?  Marland Kitchen GASTROPLASTY    . JOINT REPLACEMENT    . LEFT HEART CATHETERIZATION WITH CORONARY ANGIOGRAM N/A 10/24/2011   Procedure: LEFT HEART CATHETERIZATION WITH CORONARY ANGIOGRAM;  Surgeon: Candee Furbish, MD;  Location: [facility] CATH LAB;  Service: Cardiovascular;  Laterality: N/A;  right radial artery approach  . TOTAL KNEE ARTHROPLASTY Right 1990's     reports that he quit smoking about 43 years ago. His smoking use included cigarettes. He has a 40.00 pack-year  smoking history. He has never used smokeless tobacco. He reports that he does not drink alcohol or use drugs.  Allergies  Allergen Reactions  . Ace Inhibitors Swelling    Pt tolerates lisinopril  . Lipitor [Atorvastatin Calcium] Swelling  . Metformin And Related Swelling  . Morphine And Related Other (See Comments)    Sweating, feels like is "in rocky boat."  . Robaxin [Methocarbamol] Other (See Comments)    Feels like he is shaky    Family History  Problem Relation Age of Onset  . Diabetes type I Father   . Hypertension Mother   . Cancer Brother         Testicular Cancer  . Cancer Brother        Leukemia     Prior to Admission medications   Medication Sig Start Date End Date Taking? Authorizing Provider  acetaminophen (TYLENOL) 500 MG tablet Take 1,000 mg by mouth every 8 (eight) hours as needed.  01/10/18   [provider]  aluminum-magnesium hydroxide-simethicone (MAALOX) 109-323-55 MG/5ML SUSP Take 30 mLs by mouth every 6 (six) hours as needed (indigestion).     [provider]  amLODipine (NORVASC) 5 MG tablet Take 5 mg by mouth daily.    [provider]  bisacodyl (DULCOLAX) 10 MG suppository Place 10 mg rectally daily as needed for moderate constipation. 04/19/18   [provider]  Cholecalciferol 1000 units tablet Take 1,000 Units by mouth daily. 12/05/17   [provider]  diclofenac sodium (VOLTAREN) 1 % GEL Apply topically 3 (three) times daily. Apply to left shoulder topically three times daily     [provider]  escitalopram (LEXAPRO) 5 MG tablet Take 5 mg by mouth daily.    [provider]  ezetimibe (ZETIA) 10 MG tablet Take 10 mg by mouth daily.    [provider]  folic acid (FOLVITE) 1 MG tablet Take 1 mg by mouth daily.    [provider]  gabapentin (NEURONTIN) 100 MG capsule Take 200 mg by mouth 2 (two) times daily.    [provider]  guaiFENesin (MUCINEX) 600 MG 12 hr  tablet Take 600 mg by mouth 2 (two) times daily as needed for cough.     [provider]  hydrOXYzine (ATARAX/VISTARIL) 25 MG tablet Take 25 mg by mouth 3 (three) times daily.     [provider]  Insulin Glargine (TOUJEO SOLOSTAR) 300 UNIT/ML SOPN Inject 85 Units into the skin at bedtime.  03/06/18   [provider]  insulin lispro (HUMALOG) 100 UNIT/ML injection Inject 25 Units into the skin 3 (three) times daily with meals. Also inject 8 units subcutaneously every 8 hours as needed for CBG greater than 300 03/06/18   [provider]  ipratropium-albuterol (DUONEB) 0.5-2.5 (3) MG/3ML SOLN Take 3 mLs by nebulization every 6 (six) hours as needed (sob).     [provider]  linagliptin (TRADJENTA) 5 MG TABS tablet Take 5 mg by mouth daily.  [provider]  methotrexate (RHEUMATREX) 10 MG tablet Take 10 mg by mouth every Wednesday. Caution: Chemotherapy. Protect from light.    [provider]  metoprolol succinate (TOPROL-XL) 25 MG 24 hr tablet Take 25 mg by mouth daily.    [provider]  Multiple Vitamin (MULTIVITAMIN) tablet Take 1 tablet by mouth daily.    [provider]  nitroGLYCERIN (NITROSTAT) 0.4 MG SL tablet Place 0.4 mg under the tongue every 5 (five) minutes as needed for chest pain.    [provider]  NON FORMULARY Low concentrated sweets diet - Regular texture, NAS (no added salt)    [provider]  omeprazole (PRILOSEC) 20 MG capsule Take 20 mg by mouth daily.    [provider]  ondansetron (ZOFRAN) 4 MG tablet Take 4 mg by mouth every 6 (six) hours as needed for nausea or vomiting. 11/15/17   [provider]  OXYGEN Inhale into the lungs. O2 at 2l/min via nasal canula as needed for shortness of breath    [provider]  polyethylene glycol (MIRALAX / GLYCOLAX) packet Take 17 g by mouth daily as needed for mild constipation or moderate constipation. 04/19/18    [provider]  predniSONE (DELTASONE) 10 MG tablet Take 10 mg by mouth daily.    [provider]  rivaroxaban (XARELTO) 10 MG TABS tablet Take 10 mg by mouth daily.    [provider]  rOPINIRole (REQUIP) 0.5 MG tablet Take 0.5 mg by mouth at bedtime. 2100    [provider]  sennosides-docusate sodium (SENOKOT-S) 8.6-50 MG tablet Take 2 tablets by mouth at bedtime. 2100    [provider]  simethicone (MYLICON) 299 MG chewable tablet Chew 125 mg by mouth every 6 (six) hours as needed for flatulence. 03/19/18   [provider]  torsemide (DEMADEX) 20 MG tablet Take 20 mg by mouth daily.     [provider]  traMADol (ULTRAM) 50 MG tablet Take 2 tablets (100 mg total) by mouth 3 (three) times daily. Hold if Lethargic or confused 01/13/18   Gildardo Cranker, DO  traZODone (DESYREL) 100 MG tablet Take 100 mg by mouth at bedtime.  04/04/18   [provider]  triamcinolone cream (KENALOG) 0.1 % Apply 1 application topically 2 (two) times daily.    [provider]  vitamin C (ASCORBIC ACID) 500 MG tablet Take 500 mg by mouth 2 (two) times daily.    [provider]    Physical Exam: Vitals:   09/05/19 1443 09/05/19 1530 09/05/19 1630 09/05/19 1730  BP: 109/73 102/62 121/72 103/68  Pulse: 73 64 72 72  Resp: 20 19 (!) 22 19  Temp:      TempSrc:      SpO2: 96% 97% 100% 95%  Weight:      Height:       General exam:Lethargic, sleepy, not in acute distress, resting comfortably on 3 L nasal cannula  HEENT:Oral mucosa moist, Ear/Nose WNL grossly, dentition normal. Respiratory system:Bilateral diminished air entry, no crackles and wheezing, no use of accessory muscle,non tender on palpation. Cardiovascular system: regular rate and rhythm, S1 & S2 heard, No JVD/murmurs. Gastrointestinal system:Abdomen soft, obese, BS+. Nervous System:Lethargic, limited exam due to his current mental status.   Extremities:Bilateral  lower extremity edema  2-3+, distal peripheral pulses palpable.  Skin:No rashes,no icterus. MEQ:ASTMHD muscle bulk,tone, power. Foley Catheter:+. Labs on Admission:I have personally reviewed following labs and imaging studies.  CBC: Recent Labs  Lab 09/05/19  1336  WBC 9.7  HGB 10.4*  HCT 34.8*  MCV 91.8  PLT 818   Basic Metabolic Panel: Recent Labs  Lab 09/05/19 1336  NA 126*  K 5.4*  CL 86*  CO2 30  GLUCOSE 250*  BUN 40*  CREATININE 1.54*  CALCIUM 8.6*   GFR: Estimated Creatinine Clearance: 69.8 mL/min (A) (by C-G formula based on SCr of 1.54 mg/dL (H)). Liver Function Tests: No results for input(s): AST, ALT, ALKPHOS, BILITOT, PROT, ALBUMIN in the last 168 hours. No results for input(s): LIPASE, AMYLASE in the last 168 hours. No results for input(s): AMMONIA in the last 168 hours. Coagulation Profile: No results for input(s): INR, PROTIME in the last 168 hours. Cardiac Enzymes: No results for input(s): CKTOTAL, CKMB, CKMBINDEX, TROPONINI in the last 168 hours. BNP (last 3 results) No results for input(s): PROBNP in the last 8760 hours. HbA1C: No results for input(s): HGBA1C in the last 72 hours. CBG: No results for input(s): GLUCAP in the last 168 hours. Lipid Profile: No results for input(s): CHOL, HDL, LDLCALC, TRIG, CHOLHDL, LDLDIRECT in the last 72 hours. Thyroid Function Tests: No results for input(s): TSH, T4TOTAL, FREET4, T3FREE, THYROIDAB in the last 72 hours. Anemia Panel: No results for input(s): VITAMINB12, FOLATE, FERRITIN, TIBC, IRON, RETICCTPCT in the last 72 hours. Urine analysis:    Component Value Date/Time   COLORURINE YELLOW 05/29/2018 1559   APPEARANCEUR CLOUDY (A) 05/29/2018 1559   LABSPEC 1.017 05/29/2018 1559   PHURINE 7.0 05/29/2018 1559   GLUCOSEU NEGATIVE 05/29/2018 1559   HGBUR NEGATIVE 05/29/2018 1559   BILIRUBINUR NEGATIVE 05/29/2018 1559   KETONESUR 5 (A) 05/29/2018 1559   PROTEINUR 100 (A) 05/29/2018 1559   UROBILINOGEN  1.0 07/01/2015 1455   NITRITE NEGATIVE 05/29/2018 1559   LEUKOCYTESUR LARGE (A) 05/29/2018 1559   Radiological Exams on Admission: DG Chest Portable 1 View  Result Date: 09/05/2019 CLINICAL DATA:  Shortness of breath.  Hypoxia.  COVID-19. EXAM: PORTABLE CHEST 1 VIEW COMPARISON:  Chest x-ray dated 05/29/2018 FINDINGS: The patient has a new large left pleural effusion. Air bronchograms are visible at the left lung base. There is minimal aeration remaining in the left upper lobe. There is faint haziness in the right upper lobe which may represent an early infiltrate. Heart size and pulmonary vascularity are normal. No acute bone abnormality. IMPRESSION: New large left pleural effusion with atelectasis and consolidation in the left lung as described. Possible faint infiltrate in the right upper lobe. Electronically Signed   By: Lorriane Shire M.D.   On: 09/05/2019 15:25   Assessment/Plan  Acute on chronic respiratory failure with hypoxia: Suspect multifactorial with pleural effusion, chronic diastolic CHF OSA possible OHS.BNP slightly elevated  Pleural effusion left side, large:PCCM consulted,patient will need thoracentesis and will need to consult IR in the morning.Cont supplemental oxygen for now. Trying BIPAP as above.  ?? COVID-19 infection-ED trying to get result from NH-apparently patient was recently tested at [facility] and results are not back. Checking rapid Covid antigen if it is negative we will order Covid PCR-Dr. Tomi Bamberger will follow.faint haziness in the right upper lobe may represent an early infiltrate, patient having pleural effusion and suspect this is contributing to his hypoxia and hypercapnia. Will check inflammatory markers too Discussed with the ED physician regarding this plan.  Patient is on chronic prednisone.  Acute metabolic encephalopathy, patient appears lethargic,ABG being obtained.abg came with ph 7.26,pco2 73-showing hypercapnia- spoke with the ED physician Dr. Tomi Bamberger who is  ordering BiPAP and consulting critical  care.History of OSA.  Given obesity ?OHS.  Hyponatremia sodium 126: The setting of chronic edema CHF.Overall poor prognostic indicator.  Monitor BMP.  Acute renal failure with BUN/creatinine 40/1.5, baseline around 30s/1.2: We will add gentle IV fluids for for few hours and repeat BMP. Gentle IV fluids with NS 30 ml/hr as above.  Hyperkalemia potassium 5.4: avoid ACE/ARB potassium supplementation, monitor potassium level closely.His potassium level previously has been high ranging from 4.4-6.5.  Gentle IV fluids with NS 30 ml/hr as above.  Uncontrolled type II diabetes with peripheral autonomic neuropathy: On long-term insulin, resume Lantus at lower dose 15 un qhs  and add sliding scale q6h and monitor.  Once able to take orally resume premeal insulin, and home Neurontin.  Neurogenic bladder with chronic indwelling Foley catheter/ambulatory dysfunction/bedbound: Continue Foley catheter  Hx of CVA: Continue home meds-Xarelto,zetia.  Chronic diastolic CHF with bilateral chronic leg edema: Patient on chronic diuretics, will hold due to AKI diuresis in the morning.  Check echocardiogram.  BNP slightly positive  Bolus pemphigoid on chronic steroid resume home meds  Hyperlipidemia LDL goal <70-resume.  GERD-cont ppi.   Morbid obesity Body mass index is 44.44 kg/m.  Weight loss will definitely be beneficial but not sure if it is feasible given his overall poor health, bedbound status.  Hypertension:hold home meds, resume in a.m. if blood pressure starts to go up  History of sacral decubitus, nursing to do wound care and consider wound care consultation   Restless leg syndrome /anxiety/depression resume Lexapro, requip once able to take p.o.  Anemia of chronic disease hemoglobin at 10 g range.  Monitor  Med Rec pending- discussed with covering pharmacy this evening to update the med. Once updated to notify this MD or covering PA if after 7 PM so that  we can resume meds. Add pt on Lovenox for DVT prophylaxis until able to resume Xarelto, and also add Lantus dose may need to be upped, also resume patient's po prednisone which seems to be 10 mg and adjust dose after med rec.  Severity of Illness: I certify that at the point of admission it is my clinical judgment that the patient will require inpatient hospital care spanning beyond 2 midnights from the point of admission due to high intensity of service, high risk for further deterioration and high frequency of surveillance required due to acute on chronic hypoxic respiratory failure, pleural effusion.  DVT prophylaxis:Patient on xarelto- for now lovenox until able to take po. Code Status:DNR patient has a Yellow DNR in the chart from nursing home . I Family Communication:No family at bedside. I tried to cal his friend not able to reach, then called his brother [name]-discuss about this plan of care informed that patient prognosis appears guarded to poor currently trying BiPAP, and he does have a yellow DNR form and in his last admission he was DNR too. His brother would like to have more discussion about CODE STATUS with patient once he is more awake.  He will benefit with palliative care consultation and will request one.  Discussed with ER physician and ER nursing staff.  Consults called:PCCM.  Antonieta Pert MD [facility] Pager [phone]  If 7PM-7AM, please contact night-coverage [email or web address]  09/05/2019, 6:03 PM

## 2019-09-05 NOTE — Progress Notes (Signed)
Pharmacy Antibiotic Note  [name] is a 73 y.o. male admitted on 09/05/2019 with sepsis.  Pharmacy has been consulted for vancomycin and cefepime dosing.  Plan:  Vancomycin 2000 mg IV now, then 1750 mg IV q24 hr (est AUC 477 based on SCr 1.54, Vd 0.5)  Measure vancomycin AUC at steady state as indicated  Cefepime 2 g IV q8 hr  SCr q48 hr  Height: 6\' 2"  (188 cm) Weight: (!) 346 lb 2 oz (157 kg) IBW/kg (Calculated) : 82.2  Temp (24hrs), Avg:97.4 F (36.3 C), Min:97.4 F (36.3 C), Max:97.4 F (36.3 C)  Recent Labs  Lab 09/05/19 1336  WBC 9.7  CREATININE 1.54*    Estimated Creatinine Clearance: 69.8 mL/min (A) (by C-G formula based on SCr of 1.54 mg/dL (H)).    Allergies  Allergen Reactions  . Ace Inhibitors Swelling    Pt tolerates lisinopril  . Lipitor [Atorvastatin Calcium] Swelling  . Metformin And Related Swelling  . Morphine And Related Other (See Comments)    Sweating, feels like is "in rocky boat."  . Robaxin [Methocarbamol] Other (See Comments)    Feels like he is shaky    Thank you for allowing pharmacy to be a part of this patient's care.  Sharman Garrott A 09/05/2019 9:05 PM

## 2019-09-05 NOTE — ED Triage Notes (Signed)
Patient from [facility] rehab facility, reporting drop in o2 saturation to 88%. Patient placed on 4L  [place] at facility, normally on 2L [place]. EMS placed pt on 8 L  and titrated to 5L currently. Hx of CHF.   COVID positive on 12/9.  BP 138/96 P 80 RR 28 99% 5 L  97.3 T CBG 295

## 2019-09-05 NOTE — ED Provider Notes (Addendum)
[facility] DEPT Provider Note   CSN: [id number] Arrival date & time: 09/05/19  1250     History Chief Complaint  Patient presents with  . COVID +  . Shortness of Breath    [name] is a 73 y.o. male.  HPI   Patient presents to the emergency room for evaluation of decreased oxygen saturation.  According to the EMS reports the patient is a resident of [facility] facility.  He is normally on nasal cannula oxygen at 2 L nasal cannula.  Patient's oxygen saturation decreased to 88%.  His oxygen was increased to 4 L.  According to the EMS report the patient tested positive for Covid on December 9.  Patient himself denies any particular complaints.  He does not feel like he is having difficulty with his breathing.  He denies any chest pain.  He denies any fevers.  He denies any abdominal pain.  At baseline, the patient is bedridden.  Past Medical History:  Diagnosis Date  . Arthritis    "hands, ankles, knees" (11/10/2014)  . Bladder spasms   . CAD (coronary artery disease)   . CAD in native artery 09/15/2015  . CHF (congestive heart failure) ([place])   . Chronic indwelling Foley catheter   . Chronic lower back pain   . Depression   . Diabetic diarrhea ([place])   . Diabetic neuropathy, painful ([place])   . DVT (deep venous thrombosis) (H[facility]) 1980's   LLE  . GERD (gastroesophageal reflux disease)   . Hyperlipidemia   . Hypertension   . Neurogenic bladder   . Obese   . OSA (obstructive sleep apnea)    "they wanted me to wear a mask; I couldn't" (11/10/2014)  . Pneumonia 1999  . PONV (postoperative nausea and vomiting)   . Stroke [facility]) 10/2011; 06/2014   right hand numbness/notes 10/20/2011, pt not sure he had stroke in 2013; "right hand weaker and right face not quite right since" (11/10/2014)  . Type II diabetes mellitus ([place])   . Uncontrolled type II diabetes with peripheral autonomic neuropathy ([place]) 05/09/2015    Patient Active Problem List   Diagnosis  Date Noted  . Acute on chronic respiratory failure with hypoxia ([place]) 09/05/2019  . Pleural effusion 09/05/2019  . Medically noncompliant 06/08/2018  . Chronic pain syndrome 06/08/2018  . Intractable nausea and vomiting 05/29/2018  . Sacral decubitus ulcer 05/29/2018  . History of pulmonary embolism 05/29/2018  . History of ESBL E. coli infection 05/29/2018  . History of MRSA infection 05/29/2018  . Acute adrenal insufficiency ([place]) 05/29/2018  . Cellulitis of buttock 05/29/2018  . Boil 09/08/2017  . Trigger finger 09/08/2017  . Acute pulmonary embolism ([place]) 09/07/2017  . Pressure injury of skin 09/06/2017  . Chest pain 09/03/2017  . Hyperkalemia 09/03/2017  . Allergic rhinitis 08/19/2017  . Anemia 06/25/2017  . Cellulitis of left hip 05/27/2017  . Acute cystitis without hematuria   . Dyslipidemia associated with type 2 diabetes mellitus ([place]) 01/17/2017  . 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 Hypertensive heart disease with CHF (congestive heart failure) ([place]) 04/15/2016  . H/O: CVA (cerebrovascular accident) 01/22/2016  . Sepsis ([place]) 01/11/2016  . Depression   . Syncope 11/01/2015  . GERD (gastroesophageal reflux disease) 10/22/2015  . Restless legs 10/22/2015  . CAD in native artery 09/15/2015  . Morbid obesity ([place]) 07/03/2015  . Uncontrolled type II diabetes with peripheral autonomic neuropathy ([place]) 05/09/2015  . Hyperlipidemia LDL goal <70 05/02/2015  . Neurogenic bladder 04/26/2015  .  Catheter-associated urinary tract infection ([place]) 04/14/2015  . Abdominal pain 06/25/2014  . Chronic diastolic CHF (congestive heart failure) ([place]) 04/01/2014  . Acute on chronic diastolic CHF (congestive heart failure) ([place]) 03/21/2014  . Chronic indwelling Foley catheter 12/23/2013  . Hypokalemia 03/11/2012  . Diabetic neuropathy ([place]) 10/10/2011  . OSA (obstructive sleep apnea) 10/10/2011    Past Surgical History:  Procedure Laterality Date  . CARDIAC CATHETERIZATION N/A 05/02/2015    Procedure: Left Heart Cath and Coronary Angiography;  Surgeon: Lorretta Harp, MD;  Location: [facility];  Service: Cardiovascular;  Laterality: N/A;  . CARDIAC CATHETERIZATION N/A 05/04/2015   Procedure: Left Heart Cath and Coronary Angiography;  Surgeon: Wellington Hampshire, MD;  Location: [facility];  Service: Cardiovascular;  Laterality: N/A;  . CARPAL TUNNEL RELEASE Bilateral ~ 2003-2004  . CHOLECYSTECTOMY OPEN  1970's?  Marland Kitchen GASTROPLASTY    . JOINT REPLACEMENT    . LEFT HEART CATHETERIZATION WITH CORONARY ANGIOGRAM N/A 10/24/2011   Procedure: LEFT HEART CATHETERIZATION WITH CORONARY ANGIOGRAM;  Surgeon: Candee Furbish, MD;  Location: [facility] CATH LAB;  Service: Cardiovascular;  Laterality: N/A;  right radial artery approach  . TOTAL KNEE ARTHROPLASTY Right 1990's       Family History  Problem Relation Age of Onset  . Diabetes type I Father   . Hypertension Mother   . Cancer Brother         Testicular Cancer  . Cancer Brother        Leukemia    Social History   Tobacco Use  . Smoking status: Former Smoker    Packs/day: 2.00    Years: 20.00    Pack years: 40.00    Types: Cigarettes    Quit date: 09/10/1976    Years since quitting: 43.0  . Smokeless tobacco: Never Used  Substance Use Topics  . Alcohol use: No    Alcohol/week: 0.0 standard drinks    Comment: FORMER ALCOHOLIC; "sober since 33/29/5188"  . Drug use: No    Home Medications Prior to Admission medications   Medication Sig Start Date End Date Taking? Authorizing Provider  acetaminophen (TYLENOL) 500 MG tablet Take 1,000 mg by mouth every 8 (eight) hours as needed.  01/10/18   [provider]  aluminum-magnesium hydroxide-simethicone (MAALOX) 416-606-30 MG/5ML SUSP Take 30 mLs by mouth every 6 (six) hours as needed (indigestion).     [provider]  amLODipine (NORVASC) 5 MG tablet Take 5 mg by mouth daily.    [provider]  bisacodyl (DULCOLAX) 10 MG suppository Place 10 mg  rectally daily as needed for moderate constipation. 04/19/18   [provider]  Cholecalciferol 1000 units tablet Take 1,000 Units by mouth daily. 12/05/17   [provider]  diclofenac sodium (VOLTAREN) 1 % GEL Apply topically 3 (three) times daily. Apply to left shoulder topically three times daily     [provider]  escitalopram (LEXAPRO) 5 MG tablet Take 5 mg by mouth daily.    [provider]  ezetimibe (ZETIA) 10 MG tablet Take 10 mg by mouth daily.    [provider]  folic acid (FOLVITE) 1 MG tablet Take 1 mg by mouth daily.    [provider]  gabapentin (NEURONTIN) 100 MG capsule Take 200 mg by mouth 2 (two) times daily.    [provider]  guaiFENesin (MUCINEX) 600 MG 12 hr tablet Take 600 mg by mouth 2 (two) times daily as needed for cough.  [provider]  hydrOXYzine (ATARAX/VISTARIL) 25 MG tablet Take 25 mg by mouth 3 (three) times daily.     [provider]  Insulin Glargine (TOUJEO SOLOSTAR) 300 UNIT/ML SOPN Inject 85 Units into the skin at bedtime.  03/06/18   [provider]  insulin lispro (HUMALOG) 100 UNIT/ML injection Inject 25 Units into the skin 3 (three) times daily with meals. Also inject 8 units subcutaneously every 8 hours as needed for CBG greater than 300 03/06/18   [provider]  ipratropium-albuterol (DUONEB) 0.5-2.5 (3) MG/3ML SOLN Take 3 mLs by nebulization every 6 (six) hours as needed (sob).     [provider]  linagliptin (TRADJENTA) 5 MG TABS tablet Take 5 mg by mouth daily.    [provider]  methotrexate (RHEUMATREX) 10 MG tablet Take 10 mg by mouth every Wednesday. Caution: Chemotherapy. Protect from light.    [provider]  metoprolol succinate (TOPROL-XL) 25 MG 24 hr tablet Take 25 mg by mouth daily.    [provider]  Multiple Vitamin (MULTIVITAMIN) tablet Take 1 tablet by mouth daily.    [provider]   nitroGLYCERIN (NITROSTAT) 0.4 MG SL tablet Place 0.4 mg under the tongue every 5 (five) minutes as needed for chest pain.    [provider]  NON FORMULARY Low concentrated sweets diet - Regular texture, NAS (no added salt)    [provider]  omeprazole (PRILOSEC) 20 MG capsule Take 20 mg by mouth daily.    [provider]  ondansetron (ZOFRAN) 4 MG tablet Take 4 mg by mouth every 6 (six) hours as needed for nausea or vomiting. 11/15/17   [provider]  OXYGEN Inhale into the lungs. O2 at 2l/min via nasal canula as needed for shortness of breath    [provider]  polyethylene glycol (MIRALAX / GLYCOLAX) packet Take 17 g by mouth daily as needed for mild constipation or moderate constipation. 04/19/18   [provider]  predniSONE (DELTASONE) 10 MG tablet Take 10 mg by mouth daily.    [provider]  rivaroxaban (XARELTO) 10 MG TABS tablet Take 10 mg by mouth daily.    [provider]  rOPINIRole (REQUIP) 0.5 MG tablet Take 0.5 mg by mouth at bedtime. 2100    [provider]  sennosides-docusate sodium (SENOKOT-S) 8.6-50 MG tablet Take 2 tablets by mouth at bedtime. 2100    [provider]  simethicone (MYLICON) 683 MG chewable tablet Chew 125 mg by mouth every 6 (six) hours as needed for flatulence. 03/19/18   [provider]  torsemide (DEMADEX) 20 MG tablet Take 20 mg by mouth daily.     [provider]  traMADol (ULTRAM) 50 MG tablet Take 2 tablets (100 mg total) by mouth 3 (three) times daily. Hold if Lethargic or confused 01/13/18   Gildardo Cranker, DO  traZODone (DESYREL) 100 MG tablet Take 100 mg by mouth at bedtime.  04/04/18   [provider]  triamcinolone cream (KENALOG) 0.1 % Apply 1 application topically 2 (two) times daily.    [provider]  vitamin C (ASCORBIC ACID) 500 MG tablet Take 500 mg by mouth 2 (two) times daily.    [provider]     Allergies    Ace inhibitors, Lipitor [atorvastatin calcium], Metformin and related, Morphine and related, and Robaxin [methocarbamol]  Review of Systems   Review of Systems  All other systems reviewed and are negative.   Physical Exam Updated Vital  Signs BP 103/68   Pulse 72   Temp (!) 97.4 F (36.3 C) (Axillary)   Resp 19   Ht 1.88 m (6\' 2" )   Wt (!) 157 kg   SpO2 95%   BMI 44.44 kg/m   Physical Exam Vitals and nursing note reviewed.  Constitutional:      General: He is not in acute distress.    Appearance: He is well-developed.  HENT:     Head: Normocephalic and atraumatic.     Right Ear: External ear normal.     Left Ear: External ear normal.  Eyes:     General: No scleral icterus.       Right eye: No discharge.        Left eye: No discharge.     Conjunctiva/sclera: Conjunctivae normal.  Neck:     Trachea: No tracheal deviation.  Cardiovascular:     Rate and Rhythm: Normal rate and regular rhythm.  Pulmonary:     Effort: Pulmonary effort is normal. No respiratory distress.     Breath sounds: No stridor. Rales present. No wheezing.  Chest:     Chest wall: No mass.  Abdominal:     General: Bowel sounds are normal. There is no distension.     Palpations: Abdomen is soft.     Tenderness: There is no abdominal tenderness. There is no guarding or rebound.  Genitourinary:    Comments: Indwelling Foley catheter Musculoskeletal:        General: No tenderness.     Cervical back: Neck supple.     Right lower leg: No tenderness. Edema present.     Left lower leg: No tenderness. Edema present.     Comments: Large amount of pitting edema bilateral lower extremities up to the thigh, wound noted on right lower extremity without surrounding erythema  Skin:    General: Skin is warm and dry.     Findings: No rash.  Neurological:     Mental Status: He is alert.     Cranial Nerves: No cranial nerve deficit (no facial droop, extraocular movements intact, no slurred speech).      Sensory: No sensory deficit.     Motor: Weakness present. No abnormal muscle tone or seizure activity.     Coordination: Coordination normal.     Comments: Patient is able to move all extremities but is not able to lift his legs off the bed     ED Results / Procedures / Treatments   Labs (all labs ordered are listed, but only abnormal results are displayed) Labs Reviewed  CBC - Abnormal; Notable for the following components:      Result Value   RBC 3.79 (*)    Hemoglobin 10.4 (*)    HCT 34.8 (*)    MCHC 29.9 (*)    RDW 16.1 (*)    All other components within normal limits  BASIC METABOLIC PANEL - Abnormal; Notable for the following components:   Sodium 126 (*)    Potassium 5.4 (*)    Chloride 86 (*)    Glucose, Bld 250 (*)    BUN 40 (*)    Creatinine, Ser 1.54 (*)    Calcium 8.6 (*)    GFR calc non Af Amer 45 (*)    GFR calc Af Amer 52 (*)    All other components within normal limits  BRAIN NATRIURETIC PEPTIDE - Abnormal; Notable for the following components:   B Natriuretic Peptide 261.4 (*)    All other components  within normal limits  BLOOD GAS, ARTERIAL - Abnormal; Notable for the following components:   pH, Arterial 7.261 (*)    pCO2 arterial 73.0 (*)    Bicarbonate 31.7 (*)    Acid-Base Excess 3.7 (*)    All other components within normal limits  TROPONIN I (HIGH SENSITIVITY)  TROPONIN I (HIGH SENSITIVITY)    EKG EKG Interpretation  Date/Time:  Saturday September 05 2019 14:43:08 EST Ventricular Rate:  71 PR Interval:    QRS Duration: 114 QT Interval:  467 QTC Calculation: 508 R Axis:   7 Text Interpretation: Atrial fibrillation Ventricular premature complex Incomplete left bundle branch block Low voltage, extremity leads Prolonged QT interval Baseline wander in lead(s) V1 V2 V3 V4 V5 V6 atrial fibrillation is new since last tracing Confirmed by Dorie Rank [phone]) on 09/05/2019 3:51:26 PM   Radiology DG Chest Portable 1 View  Result Date:  09/05/2019 CLINICAL DATA:  Shortness of breath.  Hypoxia.  COVID-19. EXAM: PORTABLE CHEST 1 VIEW COMPARISON:  Chest x-ray dated 05/29/2018 FINDINGS: The patient has a new large left pleural effusion. Air bronchograms are visible at the left lung base. There is minimal aeration remaining in the left upper lobe. There is faint haziness in the right upper lobe which may represent an early infiltrate. Heart size and pulmonary vascularity are normal. No acute bone abnormality. IMPRESSION: New large left pleural effusion with atelectasis and consolidation in the left lung as described. Possible faint infiltrate in the right upper lobe. Electronically Signed   By: Lorriane Shire M.D.   On: 09/05/2019 15:25    Procedures .Critical Care Performed by: Dorie Rank, MD Authorized by: Dorie Rank, MD   Critical care provider statement:    Critical care time (minutes):  35   Critical care was time spent personally by me on the following activities:  Discussions with consultants, evaluation of patient's response to treatment, examination of patient, ordering and performing treatments and interventions, ordering and review of laboratory studies, ordering and review of radiographic studies, pulse oximetry, re-evaluation of patient's condition, obtaining history from patient or surrogate and review of old charts   (including critical care time)  Medications Ordered in ED Medications - No data to display  ED Course  I have reviewed the triage vital signs and the nursing notes.  Pertinent labs & imaging results that were available during my care of the patient were reviewed by me and considered in my medical decision making (see chart for details).  Clinical Course as of Sep 04 1833  Sat Sep 05, 2019  1549 Chest x-ray shows large left pleural effusion with atelectasis and possible infiltrate   [JK]  1550 Hyponatremia noted, previous labs show hyponatremia but this does seem to be worsening compared to previous  values  Basic metabolic panel(!) [JK]  4782 Elevated  Potassium(!): 5.4 [JK]  1833 Resp acidosis noted on abg.  Will start pt on bipap.  Case discussed with Dr Melvyn Novas.  PCCM will consult on pt   [JK]    Clinical Course User Index [JK] Dorie Rank, MD   MDM Rules/Calculators/A&P                      Patient presented with increasing oxygen requirement.  Known Covid infection that diagnosis was a couple weeks ago.  Chest x-ray shows a new pleural effusion.  No leukocytosis.  No fever.  I doubt empyema.  Patient does have CHF and peripheral edema.  This could be a transudate.  Patient will require thoracentesis.  He does have several electrolyte abnormalities.  Otherwise remains hemodynamically stable.  I will consult with medical service for admission further evaluation Final Clinical Impression(s) / ED Diagnoses Final diagnoses:  Pleural effusion  Hypoxia  Hyponatremia  Hypochloremia  Atrial fibrillation, unspecified type ([place])  Respiratory acidosis      Dorie Rank, MD 09/05/19 1555    Dorie Rank, MD 09/05/19 [phone]

## 2019-09-05 NOTE — H&P (Signed)
NAME:  [name], MRN:  [id number], DOB:  June 10, 1946, LOS: 0 ADMISSION DATE:  09/05/2019, CONSULTATION DATE:  09/05/19 REFERRING MD:  Orpah Melter, CHIEF COMPLAINT:  Hxypoxia  Brief History   73 yo male with history of recently positive covid test noted to have hypoxia at SNF. Noted to be altered and hypercapnic on arrival to ED.       History of present illness   73 yo male with history of obseity bullous pemphigoid, neurogenic bladder, chornic indwelling foley, T2DM, HTN, PE on xarelto, HFpEF, chronic hypoxic respiratory fialure on 2 liters at home who presents for low saturations. Was at rehab facility when they noted this. Patient had previously denied feeling SOB. Was noted to to be sleepy as well. In ED  Patient noted to have hypercapnia with ph of 7.26 and CO2 of 73. Also noted to have slightly elevated BNP of 261.  NA of 126. Creatinine elevated above baseline which is usually around 1.2. Here it is 1.54. Troponins normal. CXR notable for left pleural effusion. And white out of right hemithorax.   Past Medical History  Bullous pemphigoid DM2 HTN PE-on chronic xarelot Obesity HFpEF   Significant Hospital Events   Admitted to floor for hypercapnic respiratory failure  Consults:  Critical care  Procedures:  None at this time  Significant Diagnostic Tests:  Thoracentesis  Micro Data:  Pending  Antimicrobials:  Vanc and cefpeime  Interim history/subjective:  NA  Objective   Blood pressure 121/72, pulse (!) 54, temperature (!) 97.4 F (36.3 C), temperature source Axillary, resp. rate (!) 28, height 6\' 2"  (1.88 m), weight (!) 157 kg, SpO2 99 %.    Vent Mode: PCV FiO2 (%):  [30 %] 30 % Set Rate:  [15 bmp] 15 bmp PEEP:  [5 cmH20] 5 cmH20  No intake or output data in the 24 hours ending 09/05/19 2140 Filed Weights   09/05/19 1317  Weight: (!) 157 kg    Examination: General: Patient lethargic and unable to coopereate HENT: Moist mucous membranes Lungs  distant with crackles no wheeze Cardiovascular: Irregular and brady. Pitting edema in lower extgremities Abdomen: Soft with some edema Extremities: Signs of veonus stasis in legs and edema noted Neuro: Not following commands. Not oriented GU: Suprapubic foley in place  Resolved Hospital Problem list   NA  Assessment & Plan:  This is a 73 yo male with history as noted above who presents for chief complaint low sats. Has maybe tested positive for COVID inm the past. Brother notes he was positive 3 weeks ago    Hypercapnic respiratory failure-Likely mixed etiology. Heart failure and COPD. Think heart failure main culprit with new efrfusion and hyponatremia -Diurese as blood pressure allows with lasix -Prednisone 10 mg continue from home for Bullous pemphigoid -Duonebs scheduled -Therapeutic thoracentesis deferred for now as likely secondary to overload and logsitically complicated overnight. So not think this will improve hypercapnia. for effusion -Bipap for the night -NPO while on Bipap -FU covid -Start Covid treatement with remdesivier and decadron if positive -Continue AC as well for PE -Would start broad spectrum abx with vanc and cefepime -FU cx's -Remdesivir  Covid-Patient positive 3 weeks ago per brother. Did not need treatment for this per brother -Remdesivir -Decadron -Broad spectrum abx -FU repeat covid. And can track down date of covid test in AM  Encephalotphy-Multifactorial-Hypercapnis could play a part as well as hyponatremia and sepsis. Also more uremic than usual with AKI -Correct metabolics with diuresis as renal function tolerates -Broad  spectrum abx for possible infection  DM2 on 85 QHS at home. Lower dose as patient NPO and altered -Lantus BID -correctional   HTN-Holding metoprolol and norvasc from home   Pleura effusion-Will need thora in future. Given body habitus and no ancillary help in ED it is not logistically feasible to perform safely at this  time. Brother aware this may need to be done -Can perform when patient comes up to ICU and bedside staff more willing to take part  PE-Noted previous. On home xarelto -Hold Xarelto in setting of AKI and start heparin  Bullous pemphigoid-On chronic steroids -Continue prednisone     Best practice:  Diet: NPO for now Pain/Anxiety/Delirium protocol (if indicated): NA VAP protocol (if indicated): NA DVT prophylaxis: Secundino Ginger GI prophylaxis: NA Glucose control: Insulin per hospitalist Mobility:NA Code Status: DNR per DNR form at bedside Family Communication: Per hospitalist Disposition: Floor  Labs   CBC: Recent Labs  Lab 09/05/19 1336  WBC 9.7  HGB 10.4*  HCT 34.8*  MCV 91.8  PLT 300    Basic Metabolic Panel: Recent Labs  Lab 09/05/19 1336  NA 126*  K 5.4*  CL 86*  CO2 30  GLUCOSE 250*  BUN 40*  CREATININE 1.54*  CALCIUM 8.6*   GFR: Estimated Creatinine Clearance: 69.8 mL/min (A) (by C-G formula based on SCr of 1.54 mg/dL (H)). Recent Labs  Lab 09/05/19 1336  WBC 9.7    Liver Function Tests: No results for input(s): AST, ALT, ALKPHOS, BILITOT, PROT, ALBUMIN in the last 168 hours. No results for input(s): LIPASE, AMYLASE in the last 168 hours. No results for input(s): AMMONIA in the last 168 hours.  ABG    Component Value Date/Time   PHART 7.261 (L) 09/05/2019 1747   PCO2ART 73.0 (HH) 09/05/2019 1747   PO2ART 106 09/05/2019 1747   HCO3 31.7 (H) 09/05/2019 1747   TCO2 31 05/27/2016 0749   O2SAT 97.7 09/05/2019 1747     Coagulation Profile: No results for input(s): INR, PROTIME in the last 168 hours.  Cardiac Enzymes: No results for input(s): CKTOTAL, CKMB, CKMBINDEX, TROPONINI in the last 168 hours.  HbA1C: Hemoglobin A1C  Date/Time Value Ref Range Status  01/13/2018 12:00 AM 9.7  Final  10/25/2017 12:00 AM 9.8  Final    CBG: No results for input(s): GLUCAP in the last 168 hours.  Review of Systems:   Unable to attain as patient  altered  Past Medical History  He,  has a past medical history of Arthritis, Bladder spasms, CAD (coronary artery disease), CAD in native artery (09/15/2015), CHF (congestive heart failure) ([place]), Chronic indwelling Foley catheter, Chronic lower back pain, Depression, Diabetic diarrhea ([place]), Diabetic neuropathy, painful ([place]), DVT (deep venous thrombosis) ([place]) (1980's), GERD (gastroesophageal reflux disease), Hyperlipidemia, Hypertension, Neurogenic bladder, Obese, OSA (obstructive sleep apnea), Pneumonia (1999), PONV (postoperative nausea and vomiting), Stroke ([place]) (10/2011; 06/2014), Type II diabetes mellitus ([place]), and Uncontrolled type II diabetes with peripheral autonomic neuropathy ([place]) (05/09/2015).   Surgical History    Past Surgical History:  Procedure Laterality Date  . CARDIAC CATHETERIZATION N/A 05/02/2015   Procedure: Left Heart Cath and Coronary Angiography;  Surgeon: Lorretta Harp, MD;  Location: [facility];  Service: Cardiovascular;  Laterality: N/A;  . CARDIAC CATHETERIZATION N/A 05/04/2015   Procedure: Left Heart Cath and Coronary Angiography;  Surgeon: Wellington Hampshire, MD;  Location: [facility];  Service: Cardiovascular;  Laterality: N/A;  . CARPAL TUNNEL RELEASE Bilateral ~ 2003-2004  . CHOLECYSTECTOMY OPEN  1970's?  [facility] GASTROPLASTY    . JOINT REPLACEMENT    . LEFT HEART CATHETERIZATION WITH CORONARY ANGIOGRAM N/A 10/24/2011   Procedure: LEFT HEART CATHETERIZATION WITH CORONARY ANGIOGRAM;  Surgeon: Candee Furbish, MD;  Location[facility];  Service: Cardiovascular;  Laterality: N/A;  right radial artery approach  . TOTAL KNEE ARTHROPLASTY Right 1990's     Social History   reports that he quit smoking about 43 years ago. His smoking use included cigarettes. He has a 40.00 pack-year smoking history. He has never used smokeless tobacco. He reports that he does not drink alcohol or use drugs.   Family History   His family history includes Cancer in his brother  and brother; Diabetes type I in his father; Hypertension in his mother.   Allergies Allergies  Allergen Reactions  . Ace Inhibitors Swelling    Pt tolerates lisinopril  . Lipitor [Atorvastatin Calcium] Swelling  . Metformin And Related Swelling  . Atorvastatin Calcium   . Morphine And Related Other (See Comments)    Sweating, feels like is "in rocky boat."  . Robaxin [Methocarbamol] Other (See Comments)    Feels like he is shaky     Home Medications  Prior to Admission medications   Medication Sig Start Date End Date Taking? Authorizing Provider  acetaminophen (TYLENOL) 500 MG tablet Take 1,000 mg by mouth every 8 (eight) hours as needed.  01/10/18   [provider]  aluminum-magnesium hydroxide-simethicone (MAALOX) 277-824-23 MG/5ML SUSP Take 30 mLs by mouth every 6 (six) hours as needed (indigestion).     [provider]  amLODipine (NORVASC) 5 MG tablet Take 5 mg by mouth daily.    [provider]  bisacodyl (DULCOLAX) 10 MG suppository Place 10 mg rectally daily as needed for moderate constipation. 04/19/18   [provider]  Cholecalciferol 1000 units tablet Take 1,000 Units by mouth daily. 12/05/17   [provider]  diclofenac sodium (VOLTAREN) 1 % GEL Apply topically 3 (three) times daily. Apply to left shoulder topically three times daily     [provider]  escitalopram (LEXAPRO) 5 MG tablet Take 5 mg by mouth daily.    [provider]  ezetimibe (ZETIA) 10 MG tablet Take 10 mg by mouth daily.    [provider]  folic acid (FOLVITE) 1 MG tablet Take 1 mg by mouth daily.    [provider]  gabapentin (NEURONTIN) 100 MG capsule Take 200 mg by mouth 2 (two) times daily.    [provider]  guaiFENesin (MUCINEX) 600 MG 12 hr tablet Take 600 mg by mouth 2 (two) times daily as needed for cough.     [provider]  hydrOXYzine (ATARAX/VISTARIL) 25 MG tablet Take 25 mg by mouth 3 (three)  times daily.     [provider]  Insulin Glargine (TOUJEO SOLOSTAR) 300 UNIT/ML SOPN Inject 85 Units into the skin at bedtime.  03/06/18   [provider]  insulin lispro (HUMALOG) 100 UNIT/ML injection Inject 25 Units into the skin 3 (three) times daily with meals. Also inject 8 units subcutaneously every 8 hours as needed for CBG greater than 300 03/06/18   [provider]  ipratropium-albuterol (DUONEB) 0.5-2.5 (3) MG/3ML SOLN Take 3 mLs by nebulization every 6 (six) hours as needed (sob).     [provider]  linagliptin (TRADJENTA) 5 MG TABS tablet Take 5 mg by mouth daily.    [provider]  methotrexate (RHEUMATREX) 10 MG tablet Take 10  mg by mouth every Wednesday. Caution: Chemotherapy. Protect from light.    [provider]  metoprolol succinate (TOPROL-XL) 25 MG 24 hr tablet Take 25 mg by mouth daily.    [provider]  Multiple Vitamin (MULTIVITAMIN) tablet Take 1 tablet by mouth daily.    [provider]  nitroGLYCERIN (NITROSTAT) 0.4 MG SL tablet Place 0.4 mg under the tongue every 5 (five) minutes as needed for chest pain.    [provider]  NON FORMULARY Low concentrated sweets diet - Regular texture, NAS (no added salt)    [provider]  omeprazole (PRILOSEC) 20 MG capsule Take 20 mg by mouth daily.    [provider]  ondansetron (ZOFRAN) 4 MG tablet Take 4 mg by mouth every 6 (six) hours as needed for nausea or vomiting. 11/15/17   [provider]  OXYGEN Inhale into the lungs. O2 at 2l/min via nasal canula as needed for shortness of breath    [provider]  polyethylene glycol (MIRALAX / GLYCOLAX) packet Take 17 g by mouth daily as needed for mild constipation or moderate constipation. 04/19/18   [provider]  predniSONE (DELTASONE) 10 MG tablet Take 10 mg by mouth daily.    [provider]  rivaroxaban (XARELTO) 10 MG TABS tablet Take 10 mg by  mouth daily.    [provider]  rOPINIRole (REQUIP) 0.5 MG tablet Take 0.5 mg by mouth at bedtime. 2100    [provider]  sennosides-docusate sodium (SENOKOT-S) 8.6-50 MG tablet Take 2 tablets by mouth at bedtime. 2100    [provider]  simethicone (MYLICON) 638 MG chewable tablet Chew 125 mg by mouth every 6 (six) hours as needed for flatulence. 03/19/18   [provider]  torsemide (DEMADEX) 20 MG tablet Take 20 mg by mouth daily.     [provider]  traMADol (ULTRAM) 50 MG tablet Take 2 tablets (100 mg total) by mouth 3 (three) times daily. Hold if Lethargic or confused 01/13/18   Gildardo Cranker, DO  traZODone (DESYREL) 100 MG tablet Take 100 mg by mouth at bedtime.  04/04/18   [provider]  triamcinolone cream (KENALOG) 0.1 % Apply 1 application topically 2 (two) times daily.    [provider]  vitamin C (ASCORBIC ACID) 500 MG tablet Take 500 mg by mouth 2 (two) times daily.    [provider]     Critical care time: 60 minutes

## 2019-09-05 NOTE — Progress Notes (Signed)
ANTICOAGULATION CONSULT NOTE - Initial Consult  Pharmacy Consult for IV heparin Indication: pulmonary embolus  On PTA xarelto 10 mg?  Allergies  Allergen Reactions  . Ace Inhibitors Swelling    Pt tolerates lisinopril  . Lipitor [Atorvastatin Calcium] Swelling  . Metformin And Related Swelling  . Atorvastatin Calcium   . Morphine And Related Other (See Comments)    Sweating, feels like is "in rocky boat."  . Robaxin [Methocarbamol] Other (See Comments)    Feels like he is shaky    Patient Measurements: Height: 6\' 2"  (188 cm) Weight: (!) 346 lb 2 oz (157 kg) IBW/kg (Calculated) : 82.2 Heparin Dosing Weight: 105 kg  Vital Signs: Temp: 97.4 F (36.3 C) (12/26 1310) Temp Source: Axillary (12/26 1310) BP: 94/64 (12/26 2200) Pulse Rate: 52 (12/26 2200)  Labs: Recent Labs    09/05/19 1336 09/05/19 1614 09/05/19 2202 09/05/19 2203  HGB 10.4*  --   --   --   HCT 34.8*  --   --   --   PLT 329  --   --   --   APTT  --   --  39*  --   LABPROT  --   --  16.8*  --   INR  --   --  1.4*  --   HEPARINUNFRC  --   --   --  0.69  CREATININE 1.54*  --   --   --   TROPONINIHS 9 10  --   --     Estimated Creatinine Clearance: 69.8 mL/min (A) (by C-G formula based on SCr of 1.54 mg/dL (H)).   Medical History: Past Medical History:  Diagnosis Date  . Arthritis    "hands, ankles, knees" (11/10/2014)  . Bladder spasms   . CAD (coronary artery disease)   . CAD in native artery 09/15/2015  . CHF (congestive heart failure) ([place])   . Chronic indwelling Foley catheter   . Chronic lower back pain   . Depression   . Diabetic diarrhea ([place])   . Diabetic neuropathy, painful ([place])   . DVT (deep venous thrombosis) ([facility]) 1980's   LLE  . GERD (gastroesophageal reflux disease)   . Hyperlipidemia   . Hypertension   . Neurogenic bladder   . Obese   . OSA (obstructive sleep apnea)    "they wanted me to wear a mask; I couldn't" (11/10/2014)  . Pneumonia 1999  . PONV (postoperative nausea and  vomiting)   . Stroke [facility]) 10/2011; 06/2014   right hand numbness/notes 10/20/2011, pt not sure he had stroke in 2013; "right hand weaker and right face not quite right since" (11/10/2014)  . Type II diabetes mellitus ([place])   . 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 arformoterol  15 mcg Nebulization BID  . budesonide (PULMICORT) nebulizer solution  0.25 mg Nebulization BID  . dexamethasone (DECADRON) injection  6 mg Intravenous Q24H  . furosemide  80 mg Intravenous Once  . insulin aspart  0-20 Units Subcutaneous Q4H  . [START ON 09/06/2019] insulin glargine  20 Units Subcutaneous BID  . [START ON 09/06/2019] predniSONE  10 mg Oral Q breakfast   Infusions:  . sodium chloride 30 mL/hr at 09/05/19 2219  . ceFEPime (MAXIPIME) IV 2 g (09/05/19 2215)  . heparin    . remdesivir 200 mg in sodium chloride 0.9% 250 mL IVPB    . vancomycin     Followed by  . [  START ON 09/06/2019] vancomycin      Assessment: 73 yoM on xarelto 10 mg for hx if PE? Now with hypoxia. IV heparin ordered.  Baseline labs: H/H = 10.4/34.8, plst = 325, aptt =39, INR = 1.4, HL = 0.69 Will use aptt to follow until doac affect on HL diminishes  Goal of Therapy:  Heparin level 0.3-0.7 units/ml Monitor platelets by anticoagulation protocol: Yes   Plan:  Baseline coags stat Start heparin drip at 2000 units/hr Daily CBC/HL Check 1st aptt and HL in 8 hours  Lawana Pai R 09/05/2019,10:46 PM

## 2019-09-06 ENCOUNTER — Inpatient Hospital Stay (HOSPITAL_COMMUNITY): Payer: Medicare Other

## 2019-09-06 DIAGNOSIS — I5032 Chronic diastolic (congestive) heart failure: Secondary | ICD-10-CM

## 2019-09-06 LAB — COMPREHENSIVE METABOLIC PANEL
ALT: 11 U/L (ref 0–44)
AST: 11 U/L — ABNORMAL LOW (ref 15–41)
Albumin: 2 g/dL — ABNORMAL LOW (ref 3.5–5.0)
Alkaline Phosphatase: 106 U/L (ref 38–126)
Anion gap: 8 (ref 5–15)
BUN: 40 mg/dL — ABNORMAL HIGH (ref 8–23)
CO2: 29 mmol/L (ref 22–32)
Calcium: 8.5 mg/dL — ABNORMAL LOW (ref 8.9–10.3)
Chloride: 88 mmol/L — ABNORMAL LOW (ref 98–111)
Creatinine, Ser: 1.58 mg/dL — ABNORMAL HIGH (ref 0.61–1.24)
GFR calc Af Amer: 50 mL/min — ABNORMAL LOW (ref >60)
GFR calc non Af Amer: 43 mL/min — ABNORMAL LOW (ref >60)
Glucose, Bld: 162 mg/dL — ABNORMAL HIGH (ref 70–99)
Potassium: 5.5 mmol/L — ABNORMAL HIGH (ref 3.5–5.1)
Sodium: 125 mmol/L — ABNORMAL LOW (ref 135–145)
Total Bilirubin: 0.4 mg/dL (ref 0.3–1.2)
Total Protein: 5.5 g/dL — ABNORMAL LOW (ref 6.5–8.1)

## 2019-09-06 LAB — CBC WITH DIFFERENTIAL/PLATELET
Abs Immature Granulocytes: 0.15 K/uL — ABNORMAL HIGH (ref 0.00–0.07)
Basophils Absolute: 0 K/uL (ref 0.0–0.1)
Basophils Relative: 0 %
Eosinophils Absolute: 0.1 K/uL (ref 0.0–0.5)
Eosinophils Relative: 1 %
HCT: 33.7 % — ABNORMAL LOW (ref 39.0–52.0)
Hemoglobin: 9.8 g/dL — ABNORMAL LOW (ref 13.0–17.0)
Immature Granulocytes: 2 %
Lymphocytes Relative: 4 %
Lymphs Abs: 0.4 K/uL — ABNORMAL LOW (ref 0.7–4.0)
MCH: 26.9 pg (ref 26.0–34.0)
MCHC: 29.1 g/dL — ABNORMAL LOW (ref 30.0–36.0)
MCV: 92.6 fL (ref 80.0–100.0)
Monocytes Absolute: 0.2 K/uL (ref 0.1–1.0)
Monocytes Relative: 2 %
Neutro Abs: 8.2 K/uL — ABNORMAL HIGH (ref 1.7–7.7)
Neutrophils Relative %: 91 %
Platelets: 303 K/uL (ref 150–400)
RBC: 3.64 MIL/uL — ABNORMAL LOW (ref 4.22–5.81)
RDW: 15.9 % — ABNORMAL HIGH (ref 11.5–15.5)
WBC: 9 K/uL (ref 4.0–10.5)
nRBC: 0.2 % (ref 0.0–0.2)

## 2019-09-06 LAB — BLOOD GAS, ARTERIAL
Acid-Base Excess: 4.9 mmol/L — ABNORMAL HIGH (ref 0.0–2.0)
Allens test (pass/fail): POSITIVE — AB
Bicarbonate: 29.9 mmol/L — ABNORMAL HIGH (ref 20.0–28.0)
Expiratory PAP: 8
FIO2: 30
Inspiratory PAP: 24
O2 Saturation: 98.7 %
Patient temperature: 96.9
RATE: 15 {breaths}/min
pCO2 arterial: 46.9 mmHg (ref 32.0–48.0)
pH, Arterial: 7.415 (ref 7.350–7.450)
pO2, Arterial: 110 mmHg — ABNORMAL HIGH (ref 83.0–108.0)

## 2019-09-06 LAB — ECHOCARDIOGRAM COMPLETE
Height: 74 in
Weight: 5537.96 oz

## 2019-09-06 LAB — BLOOD GAS, VENOUS
Acid-Base Excess: 5.6 mmol/L — ABNORMAL HIGH (ref 0.0–2.0)
Bicarbonate: 33.1 mmol/L — ABNORMAL HIGH (ref 20.0–28.0)
O2 Saturation: 50.1 %
Patient temperature: 98.6
pCO2, Ven: 70.2 mmHg (ref 44.0–60.0)
pH, Ven: 7.295 (ref 7.250–7.430)
pO2, Ven: <31 mmHg — CL (ref 32.0–45.0)

## 2019-09-06 LAB — RESPIRATORY PANEL BY RT PCR (FLU A&B, COVID)
Influenza A by PCR: NEGATIVE
Influenza B by PCR: NEGATIVE
SARS Coronavirus 2 by RT PCR: NEGATIVE

## 2019-09-06 LAB — CBG MONITORING, ED
Glucose-Capillary: 120 mg/dL — ABNORMAL HIGH (ref 70–99)
Glucose-Capillary: 121 mg/dL — ABNORMAL HIGH (ref 70–99)
Glucose-Capillary: 146 mg/dL — ABNORMAL HIGH (ref 70–99)
Glucose-Capillary: 155 mg/dL — ABNORMAL HIGH (ref 70–99)
Glucose-Capillary: 212 mg/dL — ABNORMAL HIGH (ref 70–99)

## 2019-09-06 LAB — HEPARIN LEVEL (UNFRACTIONATED): Heparin Unfractionated: 1.06 IU/mL — ABNORMAL HIGH (ref 0.30–0.70)

## 2019-09-06 LAB — MAGNESIUM: Magnesium: 2.2 mg/dL (ref 1.7–2.4)

## 2019-09-06 LAB — HEMOGLOBIN A1C
Hgb A1c MFr Bld: 7.7 % — ABNORMAL HIGH (ref 4.8–5.6)
Mean Plasma Glucose: 174.29 mg/dL

## 2019-09-06 LAB — PROCALCITONIN: Procalcitonin: 0.1 ng/mL

## 2019-09-06 LAB — APTT: aPTT: >200 seconds (ref 24–36)

## 2019-09-06 LAB — GLUCOSE, CAPILLARY
Glucose-Capillary: 123 mg/dL — ABNORMAL HIGH (ref 70–99)
Glucose-Capillary: 130 mg/dL — ABNORMAL HIGH (ref 70–99)

## 2019-09-06 LAB — PHOSPHORUS: Phosphorus: 3.4 mg/dL (ref 2.5–4.6)

## 2019-09-06 LAB — C-REACTIVE PROTEIN: CRP: 2.1 mg/dL — ABNORMAL HIGH (ref <1.0)

## 2019-09-06 LAB — FERRITIN: Ferritin: 49 ng/mL (ref 24–336)

## 2019-09-06 MED ORDER — TRAZODONE HCL 50 MG PO TABS
150.0000 mg | ORAL_TABLET | Freq: Every evening | ORAL | Status: DC | PRN
  Administered 2019-09-06 – 2019-09-09 (×2): 150 mg via ORAL
  Filled 2019-09-06: qty 3
  Filled 2019-09-06: qty 1

## 2019-09-06 MED ORDER — INSULIN GLARGINE 100 UNIT/ML ~~LOC~~ SOLN
10.0000 [IU] | Freq: Every day | SUBCUTANEOUS | Status: DC
  Administered 2019-09-07 – 2019-09-14 (×8): 10 [IU] via SUBCUTANEOUS
  Filled 2019-09-06 (×9): qty 0.1

## 2019-09-06 MED ORDER — LEVOTHYROXINE SODIUM 25 MCG PO TABS
25.0000 ug | ORAL_TABLET | Freq: Every day | ORAL | Status: DC
  Administered 2019-09-07 – 2019-09-17 (×11): 25 ug via ORAL
  Filled 2019-09-06 (×11): qty 1

## 2019-09-06 MED ORDER — DOCUSATE SODIUM 100 MG PO CAPS
100.0000 mg | ORAL_CAPSULE | Freq: Two times a day (BID) | ORAL | Status: DC
  Administered 2019-09-07 – 2019-09-16 (×16): 100 mg via ORAL
  Filled 2019-09-06 (×19): qty 1

## 2019-09-06 MED ORDER — CHLORHEXIDINE GLUCONATE CLOTH 2 % EX PADS
6.0000 | MEDICATED_PAD | Freq: Every day | CUTANEOUS | Status: DC
  Administered 2019-09-06 – 2019-09-16 (×9): 6 via TOPICAL

## 2019-09-06 MED ORDER — RIVAROXABAN 10 MG PO TABS
10.0000 mg | ORAL_TABLET | Freq: Every day | ORAL | Status: DC
  Administered 2019-09-06 – 2019-09-07 (×2): 10 mg via ORAL
  Filled 2019-09-06 (×2): qty 1

## 2019-09-06 MED ORDER — ROPINIROLE HCL 1 MG PO TABS
0.5000 mg | ORAL_TABLET | Freq: Every day | ORAL | Status: DC
  Administered 2019-09-06 – 2019-09-16 (×11): 0.5 mg via ORAL
  Filled 2019-09-06 (×12): qty 1

## 2019-09-06 MED ORDER — AMLODIPINE BESYLATE 5 MG PO TABS
5.0000 mg | ORAL_TABLET | Freq: Every day | ORAL | Status: DC
  Administered 2019-09-06 – 2019-09-13 (×7): 5 mg via ORAL
  Filled 2019-09-06 (×9): qty 1

## 2019-09-06 MED ORDER — GUAIFENESIN ER 600 MG PO TB12: 600.0000 mg | ORAL_TABLET | Freq: Two times a day (BID) | ORAL | Status: DC | PRN

## 2019-09-06 MED ORDER — OXYCODONE HCL 5 MG PO TABS
5.0000 mg | ORAL_TABLET | Freq: Three times a day (TID) | ORAL | Status: DC | PRN
  Administered 2019-09-06 – 2019-09-17 (×11): 5 mg via ORAL
  Filled 2019-09-06 (×12): qty 1

## 2019-09-06 MED ORDER — ASCORBIC ACID 500 MG PO TABS
500.0000 mg | ORAL_TABLET | Freq: Two times a day (BID) | ORAL | Status: DC
  Administered 2019-09-06 – 2019-09-14 (×16): 500 mg via ORAL
  Filled 2019-09-06 (×17): qty 1

## 2019-09-06 MED ORDER — RIVAROXABAN 10 MG PO TABS: 10.0000 mg | ORAL_TABLET | Freq: Every day | ORAL | Status: DC

## 2019-09-06 MED ORDER — MAGNESIUM OXIDE 400 (241.3 MG) MG PO TABS
400.0000 mg | ORAL_TABLET | Freq: Every day | ORAL | Status: DC
  Administered 2019-09-06 – 2019-09-16 (×10): 400 mg via ORAL
  Filled 2019-09-06 (×11): qty 1

## 2019-09-06 MED ORDER — NITROGLYCERIN 0.4 MG SL SUBL
0.4000 mg | SUBLINGUAL_TABLET | SUBLINGUAL | Status: DC | PRN
  Administered 2019-09-09 – 2019-09-15 (×5): 0.4 mg via SUBLINGUAL
  Filled 2019-09-06 (×7): qty 1

## 2019-09-06 MED ORDER — ZINC SULFATE 220 (50 ZN) MG PO CAPS
220.0000 mg | ORAL_CAPSULE | Freq: Every day | ORAL | Status: AC
  Administered 2019-09-06 – 2019-09-08 (×3): 220 mg via ORAL
  Filled 2019-09-06 (×3): qty 1

## 2019-09-06 MED ORDER — FERROUS SULFATE 325 (65 FE) MG PO TABS
325.0000 mg | ORAL_TABLET | Freq: Two times a day (BID) | ORAL | Status: DC
  Administered 2019-09-07 – 2019-09-13 (×13): 325 mg via ORAL
  Filled 2019-09-06 (×14): qty 1

## 2019-09-06 MED ORDER — FOLIC ACID 1 MG PO TABS
1.0000 mg | ORAL_TABLET | Freq: Every day | ORAL | Status: DC
  Administered 2019-09-06 – 2019-09-13 (×8): 1 mg via ORAL
  Filled 2019-09-06 (×9): qty 1

## 2019-09-06 MED ORDER — ZINC GLUCONATE 50 MG PO TABS: 100.0000 mg | ORAL_TABLET | Freq: Every day | ORAL | Status: DC

## 2019-09-06 MED ORDER — ADULT MULTIVITAMIN W/MINERALS CH
1.0000 | ORAL_TABLET | Freq: Every day | ORAL | Status: DC
  Administered 2019-09-06 – 2019-09-13 (×7): 1 via ORAL
  Filled 2019-09-06 (×9): qty 1

## 2019-09-06 MED ORDER — PANTOPRAZOLE SODIUM 40 MG PO TBEC
40.0000 mg | DELAYED_RELEASE_TABLET | Freq: Every day | ORAL | Status: DC
  Administered 2019-09-06 – 2019-09-16 (×9): 40 mg via ORAL
  Filled 2019-09-06 (×10): qty 1

## 2019-09-06 MED ORDER — ASPIRIN EC 81 MG PO TBEC
81.0000 mg | DELAYED_RELEASE_TABLET | Freq: Every day | ORAL | Status: DC
  Administered 2019-09-06 – 2019-09-13 (×8): 81 mg via ORAL
  Filled 2019-09-06 (×9): qty 1

## 2019-09-06 MED ORDER — PERFLUTREN LIPID MICROSPHERE
1.0000 mL | INTRAVENOUS | Status: AC | PRN
  Administered 2019-09-06: 14:00:00 2 mL via INTRAVENOUS
  Filled 2019-09-06: qty 10

## 2019-09-06 MED ORDER — ROSUVASTATIN CALCIUM 5 MG PO TABS
5.0000 mg | ORAL_TABLET | Freq: Every day | ORAL | Status: DC
  Administered 2019-09-06 – 2019-09-13 (×8): 5 mg via ORAL
  Filled 2019-09-06 (×9): qty 1

## 2019-09-06 MED ORDER — METOPROLOL SUCCINATE ER 25 MG PO TB24
25.0000 mg | ORAL_TABLET | Freq: Every day | ORAL | Status: DC
  Administered 2019-09-07 – 2019-09-08 (×2): 25 mg via ORAL
  Filled 2019-09-06 (×2): qty 1

## 2019-09-06 MED ORDER — GABAPENTIN 300 MG PO CAPS
300.0000 mg | ORAL_CAPSULE | Freq: Three times a day (TID) | ORAL | Status: DC
  Administered 2019-09-06 – 2019-09-09 (×10): 300 mg via ORAL
  Filled 2019-09-06 (×11): qty 1

## 2019-09-06 MED ORDER — IPRATROPIUM-ALBUTEROL 0.5-2.5 (3) MG/3ML IN SOLN: 3.0000 mL | Freq: Four times a day (QID) | RESPIRATORY_TRACT | Status: DC | PRN

## 2019-09-06 MED ORDER — ESCITALOPRAM OXALATE 20 MG PO TABS
20.0000 mg | ORAL_TABLET | Freq: Every day | ORAL | Status: DC
  Administered 2019-09-06 – 2019-09-16 (×9): 20 mg via ORAL
  Filled 2019-09-06 (×10): qty 1

## 2019-09-06 MED ORDER — EZETIMIBE 10 MG PO TABS
10.0000 mg | ORAL_TABLET | Freq: Every day | ORAL | Status: DC
  Administered 2019-09-07 – 2019-09-13 (×7): 10 mg via ORAL
  Filled 2019-09-06 (×8): qty 1

## 2019-09-06 NOTE — Progress Notes (Signed)
Rt took pt off BIPAP and placed on 2 LPM [place]. No distress noted at this time.  

## 2019-09-06 NOTE — Progress Notes (Addendum)
NAME:  [name], MRN:  [id number], DOB:  [birth year], LOS: 1 ADMISSION DATE:  09/05/2019, CONSULTATION DATE:  09/05/19 REFERRING MD:  Orpah Melter, CHIEF COMPLAINT:  Hxypoxia  Brief History   73 yo  MO wm SNF pt / NCB status/ 02 dep at baseline 2lpm with multiple comoribidities sent from [facility] pm 12/26 with desats and ams and found to be hypoxemic  and hypercapnic on arrival to ED with extensive  L pna/ L effusion on CT chest and PCCM asked to see   Brother reports pt tested positive for covid 19   08/19/19 not documented in Epic     History of present illness   73 yo male with history of obseity bullous pemphigoid, neurogenic bladder, chronic indwelling foley, T2DM, HTN, PE on xarelto, HFpEF, chronic hypoxic respiratory failure on 2 liters in SNF who presented for low saturations.   Patient had previously denied feeling SOB. Was noted to to be sleepy as well. In ED  Patient noted to have hypercapnia with ph of 7.26 and CO2 of 73. Also noted to have slightly elevated BNP of 261.  NA of 126. Creatinine elevated above baseline which is usually around 1.2. Here= 1.54. Troponins normal. CXR notable for left pleural effusion.    Past Medical History  Bullous pemphigoid DM2 HTN PE-on chronic xarelot Obesity HFpEF   Significant Hospital Events   Admitted  for hypercapnic respiratory failure/ NCB status -Remdesivir started 12/26 pm -Decadron started 12/26 pm   Consults:  Critical care  Procedures:    Significant Diagnostic Tests:   CT chest  12/26 1. Moderate to large left pleural effusion with associated passive atelectasis with essentially complete collapse of the left lower lobe and segmental collapse of the lingula and posterior segment left upper lobe. 2. Small right pleural effusion with more bandlike areas of adjacent subsegmental atelectatic change. 3. Ground-glass and septal thickening with vascular cuffing in the lungs compatible with pulmonary edema. 4.  Moderate pericardial effusion. Cardiomegaly with four-chamber enlargement of the heart. Dense mitral annular calcifications are noted. 5. Constellation of features are most suggestive of CHF/volume overload.  Micro Data:  BC x 2 12/26 >>> UC  12/26 >>> COVID PCR  12/27 neg rapid test SARS CORONAVIRUS 2  12/27 >>>    Antimicrobials:  Vanc and cefepime  12/26 >>  Interim history/subjective:  No change overnight per nursing though abgs show improved hypercarbia  Verbalizes no specific complaints  Objective   Blood pressure (!) 82/63, pulse (!) 49, temperature (!) 97.4 F (36.3 C), temperature source Axillary, resp. rate 15, height 6\' 2"  (1.88 m), weight (!) 157 kg, SpO2 100 %.    Vent Mode: PCV FiO2 (%):  [30 %] 30 % Set Rate:  [15 bmp] 15 bmp PEEP:  [5 cmH20-8 cmH20] 8 cmH20   Intake/Output Summary (Last 24 hours) at 09/06/2019 1638 Last data filed at 09/06/2019 4665 Gross per 24 hour  Intake 393.66 ml  Output -  Net 393.66 ml   Filed Weights   09/05/19 1317  Weight: (!) 157 kg    Examination: Obese elderly wm >> stated age  Pt not verbally responsive, moans some  No jvd Oropharynx bipap in place Neck supple Lungs with a few scattered exp > insp rhonchi bilaterally and decreased bs on L  RRR no s3 or or sign murmur - distant S1S2 Abd obese poor  excursion  Extr warm with  1-2 + pitting  edema or clubbing noted    Resolved Hospital  Problem list   NA  Assessment & Plan:  This is a 73 yo male with history as noted above who presents for chief complaint low sats.   Brother stated pt was COVID pos 3 weeks PTA but no documentation of that so test is being repeated.     Hypercapnic respiratory failure-Likely mixed etiology. Heart failure and COPD. Think heart failure main culprit with new efrfusion and hyponatremia -Diurese as blood pressure allows with lasix -Prednisone 10 mg continue from home for Bullous pemphigoid -Duonebs scheduled -Therapeutic  thoracentesis per IR  -Bipap for the night -NPO while on Bipap -Start Covid treatement with remdesivier and decadron if positive        Covid-Patient positive 3 weeks ago per brother. Did not need treatment for this per brother -Remdesivir started 12/26 pm -Decadron started 12/26 pm  -Broad spectrum abx per micro flow seet above -FU repeat covid testing w/a  (rapid is neg)   Encephalotphy-Multifactorial-Hypercapnia  could play a part as well as hyponatremia and sepsis. Also more uremic than usual with AKI -Correct metabolics with diuresis as renal function tolerates -Broad spectrum abx for possible infection as per micro flowsheet   DM2 on 85 QHS at home. Lower dose as patient NPO and altered -Lantus BID - SSI    HTN Holding metoprolol and norvasc from home   Pleura effusion- on L  -See if IR can do at bedside   PE-Noted previous. On home xarelto -Held xarelto on admit and started IV Hep ok to hold for 4-6 h prior to procedures  Bullous pemphigoid-On chronic steroids -Continue prednisone     Best practice:  Diet: NPO for now Pain/Anxiety/Delirium protocol (if indicated): NA VAP protocol (if indicated): NA DVT prophylaxis: Heparin IV for now GI prophylaxis: NA Glucose control: Insulin per hospitalist Mobility:NA Code Status: DNR per DNR form at bedside Family Communication: Per hospitalist Disposition: Floor  Labs   CBC: Recent Labs  Lab 09/05/19 1336 09/06/19 0500  WBC 9.7 9.0  NEUTROABS  --  8.2*  HGB 10.4* 9.8*  HCT 34.8* 33.7*  MCV 91.8 92.6  PLT 329 751    Basic Metabolic Panel: Recent Labs  Lab 09/05/19 1336 09/05/19 2028 09/06/19 0500  NA 126* 125* 125*  K 5.4* 5.4* 5.5*  CL 86* 87* 88*  CO2 30 31 29   GLUCOSE 250* 235* 162*  BUN 40* 39* 40*  CREATININE 1.54* 1.38* 1.58*  CALCIUM 8.6* 8.4* 8.5*  MG  --   --  2.2  PHOS  --   --  3.4   GFR: Estimated Creatinine Clearance: 68 mL/min (A) (by C-G formula based on SCr of 1.58 mg/dL  (H)). Recent Labs  Lab 09/05/19 1336 09/05/19 1904 09/05/19 2028 09/06/19 0500  PROCALCITON  --  0.12  --   --   WBC 9.7  --   --  9.0  LATICACIDVEN  --   --  1.1  --     Liver Function Tests: Recent Labs  Lab 09/05/19 2028 09/06/19 0500  AST 10* 11*  ALT 10 11  ALKPHOS 103 106  BILITOT 0.7 0.4  PROT 5.8* 5.5*  ALBUMIN 2.3* 2.0*   No results for input(s): LIPASE, AMYLASE in the last 168 hours. No results for input(s): AMMONIA in the last 168 hours.  ABG    Component Value Date/Time   PHART 7.415 09/06/2019 0330   PCO2ART 46.9 09/06/2019 0330   PO2ART 110 (H) 09/06/2019 0330   HCO3 29.9 (H) 09/06/2019 0330  TCO2 31 05/27/2016 0749   O2SAT 98.7 09/06/2019 0330     Coagulation Profile: Recent Labs  Lab 09/05/19 2202  INR 1.4*    Cardiac Enzymes: No results for input(s): CKTOTAL, CKMB, CKMBINDEX, TROPONINI in the last 168 hours.  HbA1C: Hemoglobin A1C  Date/Time Value Ref Range Status  01/13/2018 12:00 AM 9.7  Final  10/25/2017 12:00 AM 9.8  Final   Hgb A1c MFr Bld  Date/Time Value Ref Range Status  09/05/2019 07:04 PM 7.7 (H) 4.8 - 5.6 % Final    Comment:    (NOTE) Pre diabetes:          5.7%-6.4% Diabetes:              >6.4% Glycemic control for   <7.0% adults with diabetes     CBG: Recent Labs  Lab 09/05/19 2251 09/06/19 0016 09/06/19 0556 09/06/19 0755  GLUCAP 205* 212* 146* 155*    Pt remains NCB with little to offer from CCM perspective but can continue to be available if questions arise re use of BIPAP/ performanc to L thoracentesis  Christinia Gully, MD Pulmonary and [place] Cell [phone] After 5:30 PM or weekends, use [facility]per [phone]

## 2019-09-06 NOTE — Progress Notes (Signed)
  Echocardiogram 2D Echocardiogram has been performed.  Merrie Roof F 09/06/2019, 1:47 PM

## 2019-09-06 NOTE — Progress Notes (Signed)
CRITICAL VALUE ALERT  Critical Value: po2 <31  Date & Time Notied:  09/06/19 2000  Provider Notified: triad  Orders Received/Actions taken: Repeat po2 was 110. RN will continue to monitor

## 2019-09-06 NOTE — ED Notes (Signed)
Respiratory at bedside.

## 2019-09-06 NOTE — ED Notes (Signed)
Date and time results received: 09/06/19 11:11 AM  (use smartphrase ".now" to insert current time)  Test: PTT Critical Value: >200  Name of Provider Notified: Notified Primary RN Rolla Plate  Orders Received? Or Actions Taken?: Actions Taken: Notified RN Rolla Plate- she will contact hospitalist

## 2019-09-06 NOTE — ED Notes (Signed)
Per pharmacy DC heparin before giving ZARELTO.

## 2019-09-06 NOTE — ED Notes (Signed)
Paged admissions for repeat covid test. POC rapid negative.

## 2019-09-06 NOTE — ED Notes (Addendum)
Patient taken off of Bipap by respiratory patient is currently at 94% RA.   Patient placed on 2 liters at 97%.   Terrilee Croak, MD made aware of 02 status and aPTT >200   Per MD is patient can tolerate PO medications given Xarelto and stop heparin.

## 2019-09-06 NOTE — Progress Notes (Addendum)
PROGRESS NOTE  [name]  DOB: [birth year]  PCP: Patient, No Pcp Per OEV:[id number]  DOA: 09/05/2019  LOS: 1 day   Chief Complaint  Patient presents with  . COVID +  . Shortness of Breath   Brief narrative: Patient is a 73 y.o. 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  Patient was sent to the ED from [facility] rehab facility on 12/26 because of a drop in oxygen saturation to 88%. Patient is chronically on 2 L oxygen and required up to 8 L to maintain saturation close to 90%.   In the ED, patient was not febrile, hemodynamically stable, maintaining O2 sat more than 95% on 4 L oxygen. Labs showed sodium level low at 126, potassium elevated to 5.4, blood glucose level elevated to 50, BUN/creatinine 40/1.54. Chest x-ray showed new large left pleural effusion with atelectasis and consolidation in the left lung. There also was a possible faint infiltrate in the right upper lobe. POC Covid antigen test was negative Covid PCR sent.  No results yet.  Subjective: Patient was seen and examined this morning.  Elderly Caucasian male.  Morbidly obese.  On BiPAP.  Sleeping, opened eyes on verbal command.  Assessment/Plan: Suspect COVID pneumonia: -Presented with hypoxia -Chest x-ray showed left lung consolidation and possible right upper lobe consolidation. -Covid POC antigen negative.  -Procalcitonin level low at 0.1, WBC count normal. -I do not see any COVID PCR or biomarkers sent on admission. I ordered them this morning. -Patient is currently on Decadron 6 mg IV daily.  He also received remdesivir first dose last night.  While waiting for PCR test  result, will give another dose of remdesivir.  Discussed with pharmacy -Continue oxygen supplementation with nasal cannula as well as nightly BiPAP.  Large left pleural effusion  -Discussed with IR Dr. Kathlene Cote.  He is reviewed CT scan.  Patient probably does not have large but moderate pleural effusion.  Will put the order for thoracentesis tomorrow.  Acute on chronic respiratory failure with hypoxia and hypercapnia -Multifactorial : pleural effusion, pneumonia, chronic diastolic CHF, OSA  -Chronically on 2 L by nasal cannula.   -Blood gas on admission showed pH of 7.26, PCO2 elevated to 73.  He was placed on BiPAP. -Blood gas earlier this morning showed improved PCO2 to 47.  Currently on 4 L.   -Continue nightly CPAP for OSA.  Acute toxic metabolic encephalopathy -Patient was altered at presentation likely because of hypercapnia.  Mental status improving.  Continue to monitor.  Acute kidney injury -BUN/creatinine at presentation 40/1.5.  -Patient clinically seems volume overloaded and has bilateral pedal edema. -I would avoid IV fluid, order for fluid restriction.  -Repeat renal function in the morning.  Acute hyponatremia  -Sodium level low at 126 at presentation.  Likely related to congestive heart failure.  -Fluid restriction of 1550ml ordered.  Acute hyperkalemia -Potassium level elevated to 5.4 on admission. -Probably related to AKI.  Not on potassium supplementation. -Potassium remains high at 5.5 this morning.  Continue to monitor.  Type 2 diabetes mellitus-A1c 7.7  Diabetic peripheral neuropathy  -Patient is on insulin sliding scale at home. I do not see any long-acting insulin in the regimen. -Continue sliding scale insulin with Accu-Cheks. While  on Decadron, he may need long-acting insulin.  I see that he was given 20 units of Lantus last night.  We will switch it to 10 units for tonight.    Cardiovascular issues:  HTN /chronic diastolic CHF /chronic bilateral pedal  edema /hyperlipidemia /hx of CVA -Home meds include amlodipine, Toprol, aspirin, Zetia, Crestor. -Continue all.  I do not see any diuretic in his list of medicines. -Continue to monitor blood pressure. -Pending echocardiogram today.  History of pulmonary embolism -On Xarelto at home.  On admission, patient was switched to heparin because of altered mental status and inability to shelter.  Mental status improved.  Able to take Xarelto this morning.  Stop heparin drip.  H/o bullous pemphigoid -Home meds include prednisone 10 mg daily, methotrexate.   -Currently on IV Decadron.  Continue methotrexate.  Neurogenic bladder Indwelling Foley catheter History of ESBL -No urinary symptoms in this admission.  Morbid obesity - Body mass index is 44.44 kg/m. Patient has been advised to make an attempt to improve diet and exercise patterns to aid in weight loss.  Chronic ambulatory dysfunction Chronic pain Restless leg syndrome Anxiety/depression -On tramadol, oxycodone as needed. -Continue Lexapro, Requip, trazodone  Hypothyroidism -Continue Synthroid  GERD/chronic anemia -Continue ferrous sulfate, Protonix, -Hemoglobin stable.  Sacral decubitus ulcer - POA -nursing to do wound care and consider wound care consultation   Mobility: Seems to have chronic ambulatory dysfunction. Diet: Cardiac/diabetic diet Fluid: Fluid restriction DVT prophylaxis:  Xarelto Code Status:  DNR per chart Family Communication:  Not at bedside Expected Discharge:  Continue inpatient management  Consultants:  Critical care  Procedures:  None  Antimicrobials: Anti-infectives (From admission, onward)   Start     Dose/Rate Route Frequency Ordered Stop   09/06/19 1000  vancomycin (VANCOREADY) IVPB 1750 mg/350 mL     1,750 mg 175 mL/hr over 120 Minutes Intravenous Every 24 hours 09/05/19 2105     09/05/19 2315  remdesivir 200 mg in sodium chloride 0.9% 250 mL IVPB     200 mg 580 mL/hr over 30  Minutes Intravenous Once 09/05/19 2243 09/06/19 0002   09/05/19 2200  vancomycin (VANCOREADY) IVPB 2000 mg/400 mL     2,000 mg 200 mL/hr over 120 Minutes Intravenous  Once 09/05/19 2105 09/06/19 0055   09/05/19 2130  ceFEPIme (MAXIPIME) 2 g in sodium chloride 0.9 % 100 mL IVPB     2 g 200 mL/hr over 30 Minutes Intravenous Every 8 hours 09/05/19 2105          Code Status: DNR   Diet Order            Diet Heart Room service appropriate? Yes; Fluid consistency: Thin; Fluid restriction: 1500 mL Fluid  Diet effective now              Infusions:  . ceFEPime (MAXIPIME) IV 2 g (09/06/19 1403)  . vancomycin 1,750 mg (09/06/19 1205)    Scheduled Meds: . arformoterol  15 mcg Nebulization BID  . budesonide (PULMICORT) nebulizer solution  0.25 mg Nebulization BID  . dexamethasone (DECADRON) injection  6 mg Intravenous QHS  . insulin aspart  0-20 Units Subcutaneous Q4H  . [START ON 09/07/2019] insulin glargine  10 Units Subcutaneous QHS  . rivaroxaban  10 mg Oral Daily    PRN meds:    Objective: Vitals:   09/06/19 1300 09/06/19 1331  BP: (!) 141/95 (!) 156/139  Pulse: 71 (!) 56  Resp: (!) 25 20  Temp:    SpO2: 99% 100%  Intake/Output Summary (Last 24 hours) at 09/06/2019 1403 Last data filed at 09/06/2019 1151 Gross per 24 hour  Intake 627.46 ml  Output --  Net 627.46 ml   Filed Weights   09/05/19 1317  Weight: (!) 157 kg   Weight change:  Body mass index is 44.44 kg/m.   Physical Exam: General exam: Morbidly obese white male on BiPAP at the time of my evaluation Skin: No rashes, lesions or ulcers. HEENT: Atraumatic, normocephalic, supple neck, no obvious bleeding Lungs: Diminished air entry on both sides likely because of obesity, no crackles or wheezing CVS: Regular rate and rhythm, no murmur GI/Abd distended from obesity, nontender CNS: Opens eyes on verbal command, mental status gradually improving Psychiatry: Mood appropriate Extremities: Chronic  bilateral pedal edema, no calf tenderness  Data Review: I have personally reviewed the laboratory data and studies available.  Recent Labs  Lab 09/05/19 1336 09/06/19 0500  WBC 9.7 9.0  NEUTROABS  --  8.2*  HGB 10.4* 9.8*  HCT 34.8* 33.7*  MCV 91.8 92.6  PLT 329 303   Recent Labs  Lab 09/05/19 1336 09/05/19 2028 09/06/19 0500  NA 126* 125* 125*  K 5.4* 5.4* 5.5*  CL 86* 87* 88*  CO2 30 31 29   GLUCOSE 250* 235* 162*  BUN 40* 39* 40*  CREATININE 1.54* 1.38* 1.58*  CALCIUM 8.6* 8.4* 8.5*  MG  --   --  2.2  PHOS  --   --  3.4    Terrilee Croak, MD  [facility] 09/06/2019

## 2019-09-06 NOTE — Progress Notes (Signed)
Patient has many skin issues. 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 Wound care consult in and all pressure injuries documented in the LDA.

## 2019-09-06 NOTE — ED Notes (Signed)
Patient alert and able to answer questions.

## 2019-09-06 NOTE — Progress Notes (Signed)
ANTICOAGULATION CONSULT NOTE - Initial Consult  Pharmacy Consult for IV heparin Indication: hx pulmonary embolus  On PTA xarelto 10 mg  Allergies  Allergen Reactions  . Ace Inhibitors Swelling    Pt tolerates lisinopril  . Lipitor [Atorvastatin Calcium] Swelling  . Metformin And Related Swelling  . Atorvastatin Calcium   . Morphine And Related Other (See Comments)    Sweating, feels like is "in rocky boat."  . Robaxin [Methocarbamol] Other (See Comments)    Feels like he is shaky    Patient Measurements: Height: 6\' 2"  (188 cm) Weight: (!) 346 lb 2 oz (157 kg) IBW/kg (Calculated) : 82.2 Heparin Dosing Weight: 105 kg  Vital Signs: BP: 89/52 (12/27 1130) Pulse Rate: 52 (12/27 1130)  Labs: Recent Labs    09/05/19 1336 09/05/19 1614 09/05/19 2028 09/05/19 2202 09/05/19 2203 09/06/19 0500 09/06/19 0800  HGB 10.4*  --   --   --   --  9.8*  --   HCT 34.8*  --   --   --   --  33.7*  --   PLT 329  --   --   --   --  303  --   APTT  --   --   --  39*  --   --  >200*  LABPROT  --   --   --  16.8*  --   --   --   INR  --   --   --  1.4*  --   --   --   HEPARINUNFRC  --   --   --   --  0.69  --  1.06*  CREATININE 1.54*  --  1.38*  --   --  1.58*  --   TROPONINIHS 9 10  --   --   --   --   --     Estimated Creatinine Clearance: 68 mL/min (A) (by C-G formula based on SCr of 1.58 mg/dL (H)).   Medical History: Past Medical History:  Diagnosis Date  . Arthritis    "hands, ankles, knees" (11/10/2014)  . Bladder spasms   . CAD (coronary artery disease)   . CAD in native artery 09/15/2015  . CHF (congestive heart failure) ([place])   . Chronic indwelling Foley catheter   . Chronic lower back pain   . Depression   . Diabetic diarrhea ([place])   . Diabetic neuropathy, painful ([place])   . DVT (deep venous thrombosis) ([facility]) 1980's   LLE  . GERD (gastroesophageal reflux disease)   . Hyperlipidemia   . Hypertension   . Neurogenic bladder   . Obese   . OSA (obstructive sleep apnea)     "they wanted me to wear a mask; I couldn't" (11/10/2014)  . Pneumonia 1999  . PONV (postoperative nausea and vomiting)   . Stroke [facility]) 10/2011; 06/2014   right hand numbness/notes 10/20/2011, pt not sure he had stroke in 2013; "right hand weaker and right face not quite right since" (11/10/2014)  . Type II diabetes mellitus ([place])   . Uncontrolled type II diabetes with peripheral autonomic neuropathy ([place]) 05/09/2015    Medications:  -Rivaroxaban 10 mg PO daily PTA for hx PE  Assessment: 73yoM on rivaroxaban 10 mg PO daily PTA for hx PE. Anticoagulation converted to heparin drip on 12/26 PM as pt was unable to tolerate PO medications.   Baseline labs:  -HL 0.69 (falsely elevated 2/2 DOAC) -Hgb 10.4 -Plt 329 -INR 1.4 (falsely elevated 2/2 DOAC)  Monitor using aPTT until HL and aPTT correlate.  Today, 09/06/19  APTT > 200 seconds is elevated on heparin infusion of 2000 units/hr  HL 1.06 remains elevated as expected given recent use of rivaroxaban  No signs/symptoms of bleeding, confirmed with RN   Xarelto 10 mg ordered by MD. Planning to transition back to home xarelto if patient can tolerate PO medications at this time.    Goal of Therapy:  APTT 66 - 102 seconds Heparin level 0.3-0.7 units/ml Monitor platelets by anticoagulation protocol: Yes   Plan:   Discontinue heparin  Give xarelto 10 mg tablet 1 hour after heparin stopped  Pharmacy signing off, please re-consult if additional transition of anticoagulation needed.   Lenis Noon, PharmD 09/06/2019,11:45 AM

## 2019-09-07 ENCOUNTER — Inpatient Hospital Stay (HOSPITAL_COMMUNITY): Payer: Medicare Other

## 2019-09-07 DIAGNOSIS — M79609 Pain in unspecified limb: Secondary | ICD-10-CM

## 2019-09-07 DIAGNOSIS — I4891 Unspecified atrial fibrillation: Secondary | ICD-10-CM

## 2019-09-07 DIAGNOSIS — M7989 Other specified soft tissue disorders: Secondary | ICD-10-CM

## 2019-09-07 DIAGNOSIS — J9601 Acute respiratory failure with hypoxia: Secondary | ICD-10-CM

## 2019-09-07 DIAGNOSIS — Z978 Presence of other specified devices: Secondary | ICD-10-CM

## 2019-09-07 DIAGNOSIS — R531 Weakness: Secondary | ICD-10-CM

## 2019-09-07 DIAGNOSIS — I5043 Acute on chronic combined systolic (congestive) and diastolic (congestive) heart failure: Secondary | ICD-10-CM

## 2019-09-07 DIAGNOSIS — Z515 Encounter for palliative care: Secondary | ICD-10-CM

## 2019-09-07 DIAGNOSIS — Z7189 Other specified counseling: Secondary | ICD-10-CM

## 2019-09-07 DIAGNOSIS — R9431 Abnormal electrocardiogram [ECG] [EKG]: Secondary | ICD-10-CM

## 2019-09-07 LAB — COMPREHENSIVE METABOLIC PANEL
ALT: 11 U/L (ref 0–44)
AST: 12 U/L — ABNORMAL LOW (ref 15–41)
Albumin: 2.4 g/dL — ABNORMAL LOW (ref 3.5–5.0)
Alkaline Phosphatase: 114 U/L (ref 38–126)
Anion gap: 10 (ref 5–15)
BUN: 47 mg/dL — ABNORMAL HIGH (ref 8–23)
CO2: 27 mmol/L (ref 22–32)
Calcium: 8.6 mg/dL — ABNORMAL LOW (ref 8.9–10.3)
Chloride: 91 mmol/L — ABNORMAL LOW (ref 98–111)
Creatinine, Ser: 1.77 mg/dL — ABNORMAL HIGH (ref 0.61–1.24)
GFR calc Af Amer: 44 mL/min — ABNORMAL LOW (ref >60)
GFR calc non Af Amer: 38 mL/min — ABNORMAL LOW (ref >60)
Glucose, Bld: 162 mg/dL — ABNORMAL HIGH (ref 70–99)
Potassium: 5.6 mmol/L — ABNORMAL HIGH (ref 3.5–5.1)
Sodium: 128 mmol/L — ABNORMAL LOW (ref 135–145)
Total Bilirubin: 0.8 mg/dL (ref 0.3–1.2)
Total Protein: 5.7 g/dL — ABNORMAL LOW (ref 6.5–8.1)

## 2019-09-07 LAB — CBC WITH DIFFERENTIAL/PLATELET
Abs Immature Granulocytes: 0.18 10*3/uL — ABNORMAL HIGH (ref 0.00–0.07)
Basophils Absolute: 0 10*3/uL (ref 0.0–0.1)
Basophils Relative: 0 %
Eosinophils Absolute: 0 10*3/uL (ref 0.0–0.5)
Eosinophils Relative: 0 %
HCT: 34.8 % — ABNORMAL LOW (ref 39.0–52.0)
Hemoglobin: 10.5 g/dL — ABNORMAL LOW (ref 13.0–17.0)
Immature Granulocytes: 2 %
Lymphocytes Relative: 4 %
Lymphs Abs: 0.4 10*3/uL — ABNORMAL LOW (ref 0.7–4.0)
MCH: 27.2 pg (ref 26.0–34.0)
MCHC: 30.2 g/dL (ref 30.0–36.0)
MCV: 90.2 fL (ref 80.0–100.0)
Monocytes Absolute: 0.2 10*3/uL (ref 0.1–1.0)
Monocytes Relative: 2 %
Neutro Abs: 9.1 10*3/uL — ABNORMAL HIGH (ref 1.7–7.7)
Neutrophils Relative %: 92 %
Platelets: 342 10*3/uL (ref 150–400)
RBC: 3.86 MIL/uL — ABNORMAL LOW (ref 4.22–5.81)
RDW: 16.7 % — ABNORMAL HIGH (ref 11.5–15.5)
WBC: 9.9 10*3/uL (ref 4.0–10.5)
nRBC: 0.3 % — ABNORMAL HIGH (ref 0.0–0.2)

## 2019-09-07 LAB — URINE CULTURE

## 2019-09-07 LAB — BRAIN NATRIURETIC PEPTIDE: B Natriuretic Peptide: 234.1 pg/mL — ABNORMAL HIGH (ref 0.0–100.0)

## 2019-09-07 LAB — ALBUMIN, PLEURAL OR PERITONEAL FLUID: Albumin, Fluid: <1 g/dL

## 2019-09-07 LAB — LACTATE DEHYDROGENASE, PLEURAL OR PERITONEAL FLUID: LD, Fluid: 56 U/L — ABNORMAL HIGH (ref 3–23)

## 2019-09-07 LAB — GLUCOSE, CAPILLARY
Glucose-Capillary: 148 mg/dL — ABNORMAL HIGH (ref 70–99)
Glucose-Capillary: 165 mg/dL — ABNORMAL HIGH (ref 70–99)
Glucose-Capillary: 171 mg/dL — ABNORMAL HIGH (ref 70–99)
Glucose-Capillary: 173 mg/dL — ABNORMAL HIGH (ref 70–99)
Glucose-Capillary: 191 mg/dL — ABNORMAL HIGH (ref 70–99)

## 2019-09-07 LAB — MRSA PCR SCREENING: MRSA by PCR: POSITIVE — AB

## 2019-09-07 LAB — PROCALCITONIN: Procalcitonin: 0.12 ng/mL

## 2019-09-07 LAB — PROTEIN, PLEURAL OR PERITONEAL FLUID: Total protein, fluid: <3 g/dL

## 2019-09-07 LAB — BODY FLUID CELL COUNT WITH DIFFERENTIAL
Lymphs, Fluid: 17 %
Monocyte-Macrophage-Serous Fluid: 69 % (ref 50–90)
Neutrophil Count, Fluid: 14 % (ref 0–25)
Total Nucleated Cell Count, Fluid: 56 cu mm (ref 0–1000)

## 2019-09-07 LAB — TROPONIN I (HIGH SENSITIVITY)
Troponin I (High Sensitivity): 9 ng/L
Troponin I (High Sensitivity): 9 ng/L (ref <18)

## 2019-09-07 LAB — CORTISOL: Cortisol, Plasma: 5.7 ug/dL

## 2019-09-07 LAB — TSH: TSH: 3.455 u[IU]/mL (ref 0.350–4.500)

## 2019-09-07 LAB — FERRITIN: Ferritin: 72 ng/mL (ref 24–336)

## 2019-09-07 LAB — C-REACTIVE PROTEIN: CRP: 1.5 mg/dL — ABNORMAL HIGH (ref <1.0)

## 2019-09-07 MED ORDER — LIDOCAINE HCL 1 % IJ SOLN
INTRAMUSCULAR | Status: AC
  Filled 2019-09-07: qty 10

## 2019-09-07 MED ORDER — FUROSEMIDE 10 MG/ML IJ SOLN
40.0000 mg | Freq: Two times a day (BID) | INTRAMUSCULAR | Status: DC
  Administered 2019-09-07: 40 mg via INTRAVENOUS
  Filled 2019-09-07: qty 4

## 2019-09-07 MED ORDER — GERHARDT'S BUTT CREAM
TOPICAL_CREAM | Freq: Two times a day (BID) | CUTANEOUS | Status: DC
  Administered 2019-09-11: 1 via TOPICAL
  Filled 2019-09-07 (×5): qty 1

## 2019-09-07 MED ORDER — ALUM & MAG HYDROXIDE-SIMETH 200-200-20 MG/5ML PO SUSP
30.0000 mL | Freq: Once | ORAL | Status: AC
  Administered 2019-09-07: 30 mL via ORAL
  Filled 2019-09-07: qty 30

## 2019-09-07 MED ORDER — MUPIROCIN 2 % EX OINT
1.0000 "application " | TOPICAL_OINTMENT | Freq: Two times a day (BID) | CUTANEOUS | Status: AC
  Administered 2019-09-07 – 2019-09-12 (×10): 1 via NASAL
  Filled 2019-09-07 (×3): qty 22

## 2019-09-07 MED ORDER — FUROSEMIDE 10 MG/ML IJ SOLN: 80.0000 mg | Freq: Two times a day (BID) | INTRAMUSCULAR | Status: DC

## 2019-09-07 MED ORDER — LIDOCAINE HCL 1 % IJ SOLN
INTRAMUSCULAR | Status: AC
  Filled 2019-09-07: qty 20

## 2019-09-07 MED ORDER — MUPIROCIN 2 % EX OINT
TOPICAL_OINTMENT | Freq: Every day | CUTANEOUS | Status: DC
  Filled 2019-09-07: qty 22

## 2019-09-07 MED ORDER — FUROSEMIDE 10 MG/ML IJ SOLN
40.0000 mg | Freq: Once | INTRAMUSCULAR | Status: AC
  Administered 2019-09-07: 40 mg via INTRAVENOUS
  Filled 2019-09-07: qty 4

## 2019-09-07 MED ORDER — CHLORHEXIDINE GLUCONATE CLOTH 2 % EX PADS
6.0000 | MEDICATED_PAD | Freq: Every day | CUTANEOUS | Status: AC
  Administered 2019-09-09 – 2019-09-12 (×4): 6 via TOPICAL

## 2019-09-07 MED ORDER — PREDNISONE 5 MG PO TABS
10.0000 mg | ORAL_TABLET | Freq: Every day | ORAL | Status: DC
  Administered 2019-09-07 – 2019-09-17 (×9): 10 mg via ORAL
  Filled 2019-09-07: qty 1
  Filled 2019-09-07 (×9): qty 2

## 2019-09-07 MED ORDER — DOXYCYCLINE HYCLATE 100 MG PO TABS
100.0000 mg | ORAL_TABLET | Freq: Two times a day (BID) | ORAL | Status: AC
  Administered 2019-09-07 – 2019-09-10 (×6): 100 mg via ORAL
  Filled 2019-09-07 (×6): qty 1

## 2019-09-07 MED ORDER — SODIUM ZIRCONIUM CYCLOSILICATE 5 G PO PACK
5.0000 g | PACK | Freq: Once | ORAL | Status: AC
  Administered 2019-09-07: 5 g via ORAL
  Filled 2019-09-07: qty 1

## 2019-09-07 MED ORDER — FUROSEMIDE 10 MG/ML IJ SOLN
80.0000 mg | Freq: Two times a day (BID) | INTRAMUSCULAR | Status: DC
  Administered 2019-09-07 – 2019-09-08 (×2): 80 mg via INTRAVENOUS
  Filled 2019-09-07 (×2): qty 8

## 2019-09-07 NOTE — Progress Notes (Addendum)
Pulmonary Follow up Note  Date of Service: 09/07/2019  CC: AMS  HPI: 73 year old man with history of obesity, bedbound, bullous pemphigoid, recent COVID infection presenting with acute on chronic hypoxemic and hypercarbic respiratory failure.  Patient improved BiPAP.  Chest x-ray revealed a left-sided pleural effusion and possible bilateral airspace disease.  Pulmonology consulted for further management.  Currently, the patient is not sure why he is here.  He denies any cough or shortness of breath.  He has diffuse pain especially in his back, this is chronic.  See medical history below for merit of chronic medical problems.  ROS  Positive Symptoms in bold:  Constitutional fevers, chills, weight loss, fatigue, anorexia, malaise  Eyes decreased vision, double vision, eye irritation  Ears, Nose, Mouth, Throat sore throat, trouble swallowing, sinus congestion  Cardiovascular chest pain, paroxysmal nocturnal dyspnea, lower ext edema, palpitations   Respiratory SOB, cough, DOE, hemoptysis, wheezing  Gastrointestinal nausea, vomiting, diarrhea  Genitourinary burning with urination, trouble urinating  Musculoskeletal joint aches, joint swelling, back pain  Integumentary  rashes, skin lesions  Neurological focal weakness, focal numbness, trouble speaking, headaches  Psychiatric depression, anxiety, confusion  Endocrine polyuria, polydipsia, cold intolerance, heat intolerance  Hematologic abnormal bruising, abnormal bleeding, unexplained nose bleeds  Allergic/Immunologic recurrent infections, hives, swollen lymph nodes    Phys Exam  GEN: Obese elderly man lying in bed HEENT: Mallampati 4, mucous membranes moist CV: Heart sounds slightly bradycardic, extremities warm PULM: Lung sounds are diminished at bases but difficult to auscultate due to body habitus, at least moderate sized effusion seen on the left with ultrasound GI: Protuberant, soft, positive bowel sounds EXT: He has global  anasarca NEURO: Moves all 4 extremities to command, unable to sit up or raise arms or legs against gravity PSYCH: Poor insight, answering questions appropriately SKIN: He has multiple areas of Candida in the skin folds  Studies He has no white count Labs show a low sodium, high potassium, chronically elevated creatinine, BNP is 234, CRP and procalcitonin are benign CBC essentially normal except for chronic anemia  Assessment # Left pleural effusion- in setting of recent COVID infection, CKD, and bedbound status, looks very clear on tap; suspect benign related to third spacing # Hyponatremia, hyperkalemia in a patient on chronic steroids, consider adrenal insufficiency although suppose end stage heart failure could appear similar (he does not really look like end stage heart failure) # Chronic foley # T2DM # Bullous pemphigoid- assume this is why on steroids? # Frail elderly- palliative consult ordered.  Baseline bedbound, dependent for ADLs.  Plan - Left effusion tapped and sent for usual studies - Continue diuretic trial, consider dropping SSRI (can cause SIADH) - Check TSH - Check MRSA PCR: if neg would do 7 days of either ceftriaxone or cefepime - Check cortisol, consider stim test or empiric hydrocortisone if becomes hypotensive - Palliative consult is reasonable - Will f/u tomorrow, fine for floor from my standpoint  Erskine Emery MD PCCM

## 2019-09-07 NOTE — Progress Notes (Signed)
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  Per notes he apparently required 8 L to maintain sats above 90%.  CT scan on admission showed bilateral pleural effusions left greater than right, pulmonary edema, cardiomegaly with pericardial effusion.  He was Covid negative.  He was placed on antibiotics and was admitted to the hospital  Subjective / 24h Interval events: States that he is breathing a little bit better.  Overall poor historian.  He cannot tell me over what period of time his legs have become more swollen.  Did state that he had an episode of chest pain about a month ago  Assessment & Plan: Principal Problem Acute on chronic hypoxic respiratory failure due to acute pulmonary edema in the setting of acute combined systolic and diastolic CHF -Patient with clinical evidence of significant fluid overload with 2+ lower extremity edema, pulmonary edema on imaging and crackles on exam -Poor historian so difficult to determine length of symptoms -will place on Lasix -2D echo done 12/27 showed -Strict ins and outs, daily weights  Active Problems Pneumonia  -Initially concern for Covid pneumonia given reports that he tested positive for Covid 3 weeks ago, however his Covid PCR came back negative.  He was placed on vancomycin and cefepime, however he is afebrile and without leukocytosis.  Will discontinue broad-spectrum antibiotics and because infectious process cannot be entirely excluded will place on doxycycline for 3 days to complete a 5-day course  Large  left-sided pleural effusion -Thoracentesis with labs ordered for today  OSA -Continue nightly CPAP  Acute metabolic encephalopathy -Patient was altered at presentation likely because of hypercapnia. -More alert and appropriate this morning however still some confusion persists, I wonder what his baseline is  Acute kidney injury -His creatinine currently is 1.7, previous values normal in 2019.  He has significant fluid overload and will place on Lasix.  Closely monitor renal function  Hyponatremia -Likely in the setting of heart failure  Hyperkalemia -Slightly higher at 5.6 today, will give Lokelma and he will also get Lasix  Type 2 diabetes mellitus with peripheral neuropathy, with hyperglycemia -A1c 7.7.  Continue insulin  CBG (last 3)  Recent Labs    09/06/19 1932 09/06/19 2334 09/07/19 0819  GLUCAP 123* 130* 148*   History of pulmonary embolism -On Xarelto at home, continue  H/o bullous pemphigoid -Home meds include prednisone 10 mg daily, methotrexate.   -Was placed briefly on IV Decadron given concern for Covid, discontinue that today and resume home prednisone  Neurogenic bladder Indwelling Foley catheter History of ESBL -No urinary symptoms in this admission.  Urine cultures without significant growth  Morbid obesity  -Patient will benefit from weight loss  Chronic ambulatory dysfunction Chronic pain Restless leg syndrome Anxiety/depression -On tramadol, oxycodone as needed. -Continue Lexapro, Requip, trazodone  Hypothyroidism -Continue Synthroid  GERD/chronic anemia -Continue ferrous sulfate, Protonix, -Hemoglobin stable.  Sacraldecubitus ulcer - POA -nursing to do wound care and consider wound care consultation   Pressure Injury 09/06/19 Toe (Comment  which one) Left Stage 2 -  Partial thickness loss of dermis presenting as a shallow open injury with a  red, pink wound bed without slough. (Active)  09/06/19 1804  Location: Toe (Comment   which one) (2nd toe )  Location Orientation: Left  Staging: Stage 2 -  Partial thickness loss of dermis presenting as a shallow open injury with a red, pink wound bed without slough.  Wound Description (Comments):   Present on Admission: Yes     Pressure Injury 09/06/19 Toe (Comment  which one) Anterior;Right Unstageable - Full thickness tissue loss in which the base of the injury is covered by slough (yellow, tan, gray, green or brown) and/or eschar (tan, brown or black) in the wound bed. (Active)  09/06/19 1806  Location: Toe (Comment  which one) (2nd toe)  Location Orientation: Anterior;Right  Staging: Unstageable - Full thickness tissue loss in which the base of the injury is covered by slough (yellow, tan, gray, green or brown) and/or eschar (tan, brown or black) in the wound bed.  Wound Description (Comments):   Present on Admission: Yes     Pressure Injury 09/06/19 Buttocks Left Deep Tissue Pressure Injury - Purple or maroon localized area of discolored intact skin or blood-filled blister due to damage of underlying soft tissue from pressure and/or shear. deep tissue, surrounded by MASD, on (Active)  09/06/19 1807  Location: Buttocks  Location Orientation: Left  Staging: Deep Tissue Pressure Injury - Purple or maroon localized area of discolored intact skin or blood-filled blister due to damage of underlying soft tissue from pressure and/or shear.  Wound Description (Comments): deep tissue, surrounded by MASD, on right and left buttocks and up back  Present on Admission: Yes     Pressure Injury 09/06/19 Thigh Posterior;Right Deep Tissue Pressure Injury - Purple or maroon localized area of discolored intact skin or blood-filled blister due to damage of underlying soft tissue from pressure and/or shear. (Active)  09/06/19 1817  Location: Thigh  Location Orientation: Posterior;Right  Staging: Deep Tissue Pressure Injury - Purple or maroon localized area of discolored intact skin or  blood-filled blister due to damage of underlying soft tissue from pressure and/or shear.  Wound Description (Comments):   Present on Admission: Yes   Scheduled Meds: . amLODipine  5 mg Oral Daily  . arformoterol  15 mcg Nebulization BID  . vitamin C  500 mg Oral BID  . aspirin EC  81 mg Oral Daily  . budesonide (PULMICORT) nebulizer solution  0.25 mg Nebulization BID  . Chlorhexidine Gluconate Cloth  6 each Topical Daily  . dexamethasone (DECADRON) injection  6 mg Intravenous QHS  . docusate sodium  100 mg Oral BID  . escitalopram  20 mg Oral Daily  . ezetimibe  10 mg Oral Daily  . ferrous sulfate  325 mg Oral BID WC  . folic acid  1 mg Oral Daily  . furosemide  40 mg Intravenous Q12H  . gabapentin  300 mg Oral TID  . Gerhardt's butt cream   Topical BID  . insulin aspart  0-20 Units Subcutaneous Q4H  . insulin glargine  10 Units Subcutaneous Daily  . levothyroxine  25 mcg Oral QAC breakfast  . magnesium oxide  400 mg Oral Daily  . metoprolol succinate  25 mg Oral Daily  . multivitamin with minerals  1 tablet Oral Daily  . mupirocin ointment   Topical Daily  . pantoprazole  40 mg Oral Daily  . rivaroxaban  10 mg Oral Daily  . rOPINIRole  0.5 mg Oral QHS  . rosuvastatin  5 mg Oral Daily  . zinc sulfate  220 mg Oral Daily   Continuous Infusions: . ceFEPime (MAXIPIME) IV Stopped (09/07/19 0548)  . vancomycin 1,750 mg (09/07/19 0942)   PRN Meds:.guaiFENesin, ipratropium-albuterol, nitroGLYCERIN, oxyCODONE, traZODone  DVT prophylaxis: Xarelto Code Status: DNR Family Communication: no family at bedside  Disposition Plan: home when ready   Consultants:  Cardiology   Procedures:  2D echo:  IMPRESSIONS    1. Left ventricular ejection fraction, by visual estimation, is 40 to 45%. The left ventricle has mild to moderately decreased function. There is no left ventricular hypertrophy.  2. Moderate hypokinesis of the left ventricular, entire apical segment.  3. Left  ventricular diastolic function could not be evaluated.  4. The left ventricle demonstrates regional wall motion abnormalities.  5. Global right ventricle has normal systolic function.The right ventricular size is normal. No increase in right ventricular wall thickness.  6. Left atrial size was moderately dilated.  7. Right atrial size was normal.  8. Moderate pericardial effusion.  9. The pericardial effusion is circumferential. 10. Moderate mitral annular calcification. 11. The mitral valve is normal in structure. Trivial mitral valve regurgitation. 12. The tricuspid valve is normal in structure. 13. The aortic valve is tricuspid. Aortic valve regurgitation is trivial. 14. The pulmonic valve was grossly normal. Pulmonic valve regurgitation is not visualized. 15. TR signal is inadequate for assessing pulmonary artery systolic pressure. 16. The inferior vena cava is dilated in size with >50% respiratory variability, suggesting right atrial pressure of 8 mmHg.  Microbiology  none  Antimicrobials: Vancomycin 12/26 -- 12/28 Cefepime 12/26 --12/28 Doxycycline 12/28   Objective: Vitals:   09/07/19 0600 09/07/19 0800 09/07/19 0825 09/07/19 0840  BP: (!) 95/46 (!) 79/32 110/63   Pulse: 62  (!) 103   Resp: (!) 24 19 (!) 22   Temp:  98.6 F (37 C)    TempSrc:  Oral    SpO2: 97%  97% 100%  Weight:      Height:        Intake/Output Summary (Last 24 hours) at 09/07/2019 1106 Last data filed at 09/07/2019 0600 Gross per 24 hour  Intake 1082.22 ml  Output 150 ml  Net 932.22 ml   Filed Weights   09/05/19 1317 09/07/19 0400  Weight: (!) 157 kg (!) 168 kg    Examination:  Constitutional: NAD Eyes: no scleral icterus ENMT: Mucous membranes are moist.  Neck: normal, supple Respiratory: clear to auscultation bilaterally, no wheezing, bibasilar crackles but diminished at the bases, exam difficult due to morbid obesity Cardiovascular: Regular rate and rhythm, no murmurs appreciated.   2+ pitting lower extremity edema Abdomen: Soft, non distended, no tenderness. Bowel sounds positive.  Musculoskeletal: no clubbing / cyanosis.  Skin: no rashes Neurologic: No focal deficits   Data Reviewed: I have independently reviewed following labs and imaging studies   CBC: Recent Labs  Lab 09/05/19 1336 09/06/19 0500 09/07/19 0458  WBC 9.7 9.0 9.9  NEUTROABS  --  8.2* 9.1*  HGB 10.4* 9.8* 10.5*  HCT 34.8* 33.7* 34.8*  MCV 91.8 92.6 90.2  PLT 329 303 798   Basic Metabolic Panel: Recent Labs  Lab 09/05/19 1336 09/05/19 2028 09/06/19 0500 09/07/19 0458  NA 126* 125* 125* 128*  K 5.4* 5.4* 5.5* 5.6*  CL 86* 87* 88* 91*  CO2 30 31 29 27   GLUCOSE 250* 235* 162* 162*  BUN 40* 39* 40* 47*  CREATININE 1.54* 1.38* 1.58* 1.77*  CALCIUM 8.6* 8.4* 8.5* 8.6*  MG  --   --  2.2  --  PHOS  --   --  3.4  --    Liver Function Tests: Recent Labs  Lab 09/05/19 2028 09/06/19 0500 09/07/19 0458  AST 10* 11* 12*  ALT 10 11 11   ALKPHOS 103 106 114  BILITOT 0.7 0.4 0.8  PROT 5.8* 5.5* 5.7*  ALBUMIN 2.3* 2.0* 2.4*   Coagulation Profile: Recent Labs  Lab 09/05/19 2202  INR 1.4*   HbA1C: Recent Labs    09/05/19 1904  HGBA1C 7.7*   CBG: Recent Labs  Lab 09/06/19 1154 09/06/19 1622 09/06/19 1932 09/06/19 2334 09/07/19 0819  GLUCAP 121* 120* 123* 130* 148*    Recent Results (from the past 240 hour(s))  Urine culture     Status: Abnormal   Collection Time: 09/05/19  8:29 PM   Specimen: Urine, Random  Result Value Ref Range Status   Specimen Description   Final    URINE, RANDOM Performed at [facility], [place] [address] [place], [place] [address]    Special Requests   Final    NONE Performed at [facility], [place] [address] [place], [place] [address]    Culture MULTIPLE SPECIES PRESENT, SUGGEST RECOLLECTION (A)  Final   Report Status 09/07/2019 FINAL  Final  Respiratory Panel by RT PCR (Flu A&B, Covid) -  Nasopharyngeal Swab     Status: None   Collection Time: 09/06/19  8:10 AM   Specimen: Nasopharyngeal Swab  Result Value Ref Range Status   SARS Coronavirus 2 by RT PCR NEGATIVE NEGATIVE Final    Comment: (NOTE) SARS-CoV-2 target nucleic acids are NOT DETECTED. The SARS-CoV-2 RNA is generally detectable in upper respiratoy specimens during the acute phase of infection. The lowest concentration of SARS-CoV-2 viral copies this assay can detect is 131 copies/mL. A negative result does not preclude SARS-Cov-2 infection and should not be used as the sole basis for treatment or other patient management decisions. A negative result may occur with  improper specimen collection/handling, submission of specimen other than nasopharyngeal swab, presence of viral mutation(s) within the areas targeted by this assay, and inadequate number of viral copies (<131 copies/mL). A negative result must be combined with clinical observations, patient history, and epidemiological information. The expected result is Negative. Fact Sheet for Patients:  PinkCheek.be Fact Sheet for Healthcare Providers:  GravelBags.it This test is not yet ap proved or cleared by the [place] FDA and  has been authorized for detection and/or diagnosis of SARS-CoV-2 by FDA under an Emergency Use Authorization (EUA). This EUA will remain  in effect (meaning this test can be used) for the duration of the COVID-19 declaration under Section 564(b)(1) of the Act, 21 U.S.C. section 360bbb-3(b)(1), unless the authorization is terminated or revoked sooner.    Influenza A by PCR NEGATIVE NEGATIVE Final   Influenza B by PCR NEGATIVE NEGATIVE Final    Comment: (NOTE) The Xpert Xpress SARS-CoV-2/FLU/RSV assay is intended as an aid in  the diagnosis of influenza from Nasopharyngeal swab specimens and  should not be used as a sole basis for treatment. Nasal washings and  aspirates  are unacceptable for Xpert Xpress SARS-CoV-2/FLU/RSV  testing. Fact Sheet for Patients: PinkCheek.be Fact Sheet for Healthcare Providers: GravelBags.it This test is not yet approved or cleared by the [place] FDA and  has been authorized for detection and/or diagnosis of SARS-CoV-2 by  FDA under an Emergency Use Authorization (EUA). This EUA will remain  in effect (meaning this test can be used) for the duration of the  Covid-19 declaration under Section 564(b)(1) of the Act, 21  U.S.C. section 360bbb-3(b)(1), unless the authorization is  terminated or revoked. Performed at [facility], [place] [address], [place],  [address]      Radiology Studies: ECHOCARDIOGRAM COMPLETE  Result Date: 09/06/2019   ECHOCARDIOGRAM REPORT   Patient Name:   JAXZEN VANHORN Date of Exam: 09/06/2019 Medical Rec #:  [id number]      Height:       74.0 in Accession #:    [id number]     Weight:       346.1 lb Date of Birth:  1946/07/21       BSA:          2.74 m Patient Age:    54 years       BP:           89/52 mmHg Patient Gender: M              HR:           59 bpm. Exam Location:  Inpatient Procedure: 2D Echo, Color Doppler, Cardiac Doppler and Intracardiac            Opacification Agent Indications:    CHF 428  History:        Patient has prior history of Echocardiogram examinations, most                 recent 09/09/2017. CHF; Aortic Valve Disease. Covid 19.  Sonographer:    Merrie Roof RDCS Referring Phys: [id number] [place] IMPRESSIONS  1. Left ventricular ejection fraction, by visual estimation, is 40 to 45%. The left ventricle has mild to moderately decreased function. There is no left ventricular hypertrophy.  2. Moderate hypokinesis of the left ventricular, entire apical segment.  3. Left ventricular diastolic function could not be evaluated.  4. The left ventricle demonstrates regional wall motion abnormalities.  5. Global  right ventricle has normal systolic function.The right ventricular size is normal. No increase in right ventricular wall thickness.  6. Left atrial size was moderately dilated.  7. Right atrial size was normal.  8. Moderate pericardial effusion.  9. The pericardial effusion is circumferential. 10. Moderate mitral annular calcification. 11. The mitral valve is normal in structure. Trivial mitral valve regurgitation. 12. The tricuspid valve is normal in structure. 13. The aortic valve is tricuspid. Aortic valve regurgitation is trivial. 14. The pulmonic valve was grossly normal. Pulmonic valve regurgitation is not visualized. 15. TR signal is inadequate for assessing pulmonary artery systolic pressure. 16. The inferior vena cava is dilated in size with >50% respiratory variability, suggesting right atrial pressure of 8 mmHg. FINDINGS  Left Ventricle: Left ventricular ejection fraction, by visual estimation, is 40 to 45%. The left ventricle has mild to moderately decreased function. Moderate hypokinesis of the left ventricular, entire apical segment. The left ventricle demonstrates regional wall motion abnormalities. The left ventricular internal cavity size was the left ventricle is normal in size. There is no left ventricular hypertrophy. The left ventricular diastology could not be evaluated due to atrial fibrillation. Left ventricular diastolic function could not be evaluated. Right Ventricle: The right ventricular size is normal. No increase in right ventricular wall thickness. Global RV systolic function is has normal systolic function. Left Atrium: Left atrial size was moderately dilated. Right Atrium: Right atrial size was normal in size Pericardium: A moderately sized pericardial effusion is present. The pericardial effusion is circumferential. There is no evidence of cardiac tamponade. Mitral Valve: The mitral  valve is normal in structure. Moderate mitral annular calcification. Trivial mitral valve  regurgitation. Tricuspid Valve: The tricuspid valve is normal in structure. Tricuspid valve regurgitation is not demonstrated. Aortic Valve: The aortic valve is tricuspid. . There is moderate thickening and moderate calcification of the aortic valve. Aortic valve regurgitation is trivial. There is moderate thickening of the aortic valve. There is moderate calcification of the aortic valve. Pulmonic Valve: The pulmonic valve was grossly normal. Pulmonic valve regurgitation is not visualized. Pulmonic regurgitation is not visualized. Aorta: The aortic root and ascending aorta are structurally normal, with no evidence of dilitation. Venous: IVC assessment for right atrial pressure unable to be performed due to mechanical ventilation. The inferior vena cava is dilated in size with greater than 50% respiratory variability, suggesting right atrial pressure of 8 mmHg. IAS/Shunts: No atrial level shunt detected by color flow Doppler.  Sanda Klein MD Electronically signed by Sanda Klein MD Signature Date/Time: 09/06/2019/2:35:11 PM    Final    Marzetta Board, MD, PhD [facility]  Between 7 am - 7 pm I am available, please contact me via Amion or [place]  Between 7 pm - 7 am I am not available, please contact night coverage MD/APP via Amion

## 2019-09-07 NOTE — Procedures (Signed)
Thora w/ [place] Note Timeout performed Left chest examined with [place] and skin overlying fluid pocket marked Area prepped and anesthesized with 1% lidocaine 1300  cc clear straw colored  fluid removed Bandaid applied to site CXR pending No immediate complications

## 2019-09-07 NOTE — Consult Note (Addendum)
Cardiology Consultation:   Patient ID: ANSLEY STANWOOD; [id number]; 1945-12-05   Admit date: 09/05/2019 Date of Consult: 09/07/2019  Primary Care Provider: Patient, No Pcp Per Primary Cardiologist: Candee Furbish, MD 09/04/2017 in-hospital Primary Electrophysiologist:  None   Patient Profile:   [name] is a 73 y.o. male with a hx of mild to moderate CAD w/ 90% RCA rx medically, obesity, HTN, HLD DM with neuropathy and neurogenic bladder, OSA not on CPAP, bullous pemphigoid, syncope, sinus pause 2nd to OSA (no arrhtymias on monitor), EF 35-40% at cath 2016 with subsequent normalization, CKD stage III, right popliteal DVT 08/2017, CVA,  who was admitted 12/26 with pneumonia and respiratory failure.  He is being seen today for the evaluation of CHF/volume overload and abnormal echo at the request of Dr. Cruzita Lederer.  History of Present Illness:   [name] has been at [facility].  He has not walked in over a year.  According to his brother, he tested positive for Covid 12/9, but 2 different tests this admission were negative.  He was hypercapnic on admission with a pH of 7.26 and a PCO2 of 73.  Sodium 126, creatinine 1.54.  Troponin negative.  [name] is not able to say how long he has been SOB. He has a dry cough.   He does not remember coming here.  He says he woke up here.  He is not really aware of why he is in the hospital, but he feels a little bit better today.  He has as needed O2 as an outpatient for low oxygen saturations.  He has not had chest pain.  He is not aware that his left arm is bigger than his right.  He is not aware that his legs are swollen.  He was on BiPAP overnight and got Lasix.  Currently, his O2 saturations are 100% on 2 L.  However, he tends to breathe rapidly with conversation and still seems to feel short of breath with minimal exertion.  He has an abnormality in his left shoulder, but says he has an old injury there.  However, he cannot tell me what  the injury was or how long ago.   Past Medical History:  Diagnosis Date  . Arthritis    "hands, ankles, knees" (11/10/2014)  . Bladder spasms   . CAD (coronary artery disease)   . CAD in native artery 09/15/2015  . CHF (congestive heart failure) ([place])   . Chronic indwelling Foley catheter   . Chronic lower back pain   . Depression   . Diabetic diarrhea ([place])   . Diabetic neuropathy, painful ([place])   . DVT (deep venous thrombosis) ([facility]) 1980's   LLE  . GERD (gastroesophageal reflux disease)   . Hyperlipidemia   . Hypertension   . Neurogenic bladder   . Obese   . OSA (obstructive sleep apnea)    "they wanted me to wear a mask; I couldn't" (11/10/2014)  . Pneumonia 1999  . PONV (postoperative nausea and vomiting)   . Stroke [facility]) 10/2011; 06/2014   right hand numbness/notes 10/20/2011, pt not sure he had stroke in 2013; "right hand weaker and right face not quite right since" (11/10/2014)  . Type II diabetes mellitus ([place])   . 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 CARDIAC CATHETERIZATION N/A 05/02/2015   Procedure: Left Heart Cath and Coronary Angiography;  Surgeon: Lorretta Harp, MD;  Location:  [place] INVASIVE CV LAB;  Service: Cardiovascular;  Laterality: N/A;  . CARDIAC CATHETERIZATION N/A 05/04/2015   Procedure: Left Heart Cath and Coronary Angiography;  Surgeon: Wellington Hampshire, MD;  Location: [place] CV LAB;  Service: Cardiovascular;  Laterality: N/A;  . CARPAL TUNNEL RELEASE Bilateral ~ 2003-2004  . CHOLECYSTECTOMY OPEN  1970's?  [facility] GASTROPLASTY    . JOINT REPLACEMENT    . LEFT HEART CATHETERIZATION WITH CORONARY ANGIOGRAM N/A 10/24/2011   Procedure: LEFT HEART CATHETERIZATION WITH CORONARY ANGIOGRAM;  Surgeon: Candee Furbish, MD;  Location: [facility] CATH LAB;  Service: Cardiovascular;  Laterality: N/A;  right radial artery approach  . TOTAL KNEE ARTHROPLASTY Right 1990's     Prior to  Admission medications   Medication Sig Start Date End Date Taking? Authorizing Provider  acetaminophen (TYLENOL) 500 MG tablet Take 1,000 mg by mouth every 8 (eight) hours as needed.  01/10/18  Yes [provider]  aluminum-magnesium hydroxide-simethicone (MAALOX) 951-884-16 MG/5ML SUSP Take 30 mLs by mouth every 6 (six) hours as needed (indigestion).    Yes [provider]  amLODipine (NORVASC) 5 MG tablet Take 5 mg by mouth daily.   Yes [provider]  aspirin EC 81 MG tablet Take 81 mg by mouth daily.   Yes [provider]  b complex vitamins tablet Take 1 tablet by mouth daily.   Yes [provider]  bisacodyl (DULCOLAX) 10 MG suppository Place 10 mg rectally daily as needed for moderate constipation. 04/19/18  Yes [provider]  cholecalciferol (VITAMIN D) 25 MCG (1000 UT) tablet Take 2,000 Units by mouth daily. For 14 days starting 12.15.2020   Yes [provider]  Cholecalciferol 1000 units tablet Take 50,000 Units by mouth every 14 (fourteen) days.  12/05/17  Yes [provider]  diclofenac sodium (VOLTAREN) 1 % GEL Apply topically 3 (three) times daily. Apply to left shoulder topically three times daily    Yes [provider]  docusate sodium (COLACE) 100 MG capsule Take 100 mg by mouth 2 (two) times daily.   Yes [provider]  escitalopram (LEXAPRO) 20 MG tablet Take 20 mg by mouth daily.    Yes [provider]  ezetimibe (ZETIA) 10 MG tablet Take 10 mg by mouth daily.   Yes [provider]  ferrous sulfate 325 (65 FE) MG tablet Take 325 mg by mouth 2 (two) times daily with a meal.   Yes [provider]  folic acid (FOLVITE) 1 MG tablet Take 1 mg by mouth daily.   Yes [provider]  gabapentin (NEURONTIN) 300 MG capsule Take 300 mg by mouth 3 (three) times daily.    Yes [provider]  guaiFENesin (MUCINEX) 600 MG 12 hr tablet Take 600 mg by mouth 2  (two) times daily as needed for cough.    Yes [provider]  insulin lispro (HUMALOG) 100 UNIT/ML injection Inject 2-12 Units into the skin See admin instructions. CBG 151-200 =2UNITS CBG 201-250 =4UNITS CBG 251-300 =6UNITS CBG 301-350 =8UNITS CBG 351-400= 10 UNITS CBG 401+ =12UNITS CALL MD IF CBG ABOVE 450 03/06/18  Yes [provider]  ipratropium-albuterol (DUONEB) 0.5-2.5 (3) MG/3ML SOLN Take 3 mLs by nebulization every 6 (six) hours as needed (sob).    Yes [provider]  levothyroxine (SYNTHROID) 25 MCG tablet Take 25 mcg by mouth daily before breakfast.   Yes [provider]  magnesium oxide (MAG-OX) 400 MG tablet Take 400 mg by mouth daily.   Yes [provider]  metoprolol succinate (TOPROL-XL) 25 MG 24 hr tablet Take 25 mg by mouth daily.   Yes [provider]  Multiple Vitamin (MULTIVITAMIN) tablet Take 1 tablet by mouth daily.   Yes [provider]  nitroGLYCERIN (NITROSTAT) 0.4 MG SL tablet Place 0.4 mg under the tongue every 5 (five) minutes as needed for chest pain.   Yes [provider]  oxyCODONE (OXY IR/ROXICODONE) 5 MG immediate release tablet Take 5 mg by mouth every 8 (eight) hours as needed for severe pain.   Yes [provider]  pantoprazole (PROTONIX) 40 MG tablet Take 40 mg by mouth daily.   Yes [provider]  polyethylene glycol (MIRALAX / GLYCOLAX) packet Take 17 g by mouth daily as needed for mild constipation or moderate constipation. 04/19/18  Yes [provider]  predniSONE (DELTASONE) 10 MG tablet Take 10 mg by mouth daily.   Yes [provider]  rivaroxaban (XARELTO) 10 MG TABS tablet Take 10 mg by mouth daily.   Yes [provider]  rOPINIRole (REQUIP) 0.5 MG tablet Take 0.5 mg by mouth at bedtime. 2100   Yes [provider]  rosuvastatin (CRESTOR) 5 MG tablet Take 5 mg by mouth daily.   Yes [provider]  simethicone  (MYLICON) 332 MG chewable tablet Chew 125 mg by mouth every 6 (six) hours as needed for flatulence. 03/19/18  Yes [provider]  traZODone (DESYREL) 100 MG tablet Take 150 mg by mouth at bedtime.  04/04/18  Yes [provider]  vitamin C (ASCORBIC ACID) 500 MG tablet Take 500 mg by mouth 2 (two) times daily.   Yes [provider]  zinc gluconate 50 MG tablet Take 100 mg by mouth daily. For 14 days starting 12.15.2020   Yes [provider]  NON FORMULARY Low concentrated sweets diet - Regular texture, NAS (no added salt)    [provider]  OXYGEN Inhale into the lungs. O2 at 2l/min via nasal canula as needed for shortness of breath    [provider]    Inpatient Medications: Scheduled Meds: . amLODipine  5 mg Oral Daily  . arformoterol  15 mcg Nebulization BID  . vitamin C  500 mg Oral BID  . aspirin EC  81 mg Oral Daily  . budesonide (PULMICORT) nebulizer solution  0.25 mg Nebulization BID  . Chlorhexidine Gluconate Cloth  6 each Topical Daily  . dexamethasone (DECADRON) injection  6 mg Intravenous QHS  . docusate sodium  100 mg Oral BID  . escitalopram  20 mg Oral Daily  . ezetimibe  10 mg Oral Daily  . ferrous sulfate  325 mg Oral BID WC  . folic acid  1 mg Oral Daily  . furosemide  40 mg Intravenous Q12H  . gabapentin  300 mg Oral TID  . insulin aspart  0-20 Units Subcutaneous Q4H  . insulin glargine  10 Units Subcutaneous Daily  . levothyroxine  25 mcg Oral QAC breakfast  . magnesium oxide  400 mg Oral Daily  . metoprolol succinate  25 mg Oral Daily  . multivitamin with minerals  1 tablet Oral Daily  . pantoprazole  40 mg Oral Daily  . rivaroxaban  10 mg Oral Daily  . rOPINIRole  0.5 mg Oral QHS  . rosuvastatin  5 mg Oral Daily  . zinc sulfate  220 mg Oral Daily   Continuous Infusions: . ceFEPime (MAXIPIME) IV Stopped (09/07/19 0548)  . vancomycin Stopped (09/06/19 1449)  PRN Meds: guaiFENesin,  ipratropium-albuterol, nitroGLYCERIN, oxyCODONE, traZODone  Allergies:    Allergies  Allergen Reactions  . Ace Inhibitors Swelling    Pt tolerates lisinopril  . Lipitor [Atorvastatin Calcium] Swelling  . Metformin And Related Swelling  . Atorvastatin Calcium   . Morphine And Related Other (See Comments)    Sweating, feels like is "in rocky boat."  . Robaxin [Methocarbamol] Other (See Comments)    Feels like he is shaky    Social History:   Social History   Socioeconomic History  . Marital status: Widowed    Spouse name: Not on file  . Number of children: Not on file  . Years of education: Not on file  . Highest education level: Not on file  Occupational History  . Occupation: Retired  Tobacco Use  . Smoking status: Former Smoker    Packs/day: 2.00    Years: 20.00    Pack years: 40.00    Types: Cigarettes    Quit date: 09/10/1976    Years since quitting: 43.0  . Smokeless tobacco: Never Used  Substance and Sexual Activity  . Alcohol use: No    Alcohol/week: 0.0 standard drinks    Comment: FORMER ALCOHOLIC; "sober since 01/75/1025"  . Drug use: No  . Sexual activity: Never  Other Topics Concern  . Not on file  Social History Narrative  . Not on file   Social Determinants of Health   Financial Resource Strain:   . Difficulty of Paying Living Expenses: Not on file  Food Insecurity:   . Worried About Charity fundraiser in the Last Year: Not on file  . Ran Out of Food in the Last Year: Not on file  Transportation Needs:   . Lack of Transportation (Medical): Not on file  . Lack of Transportation (Non-Medical): Not on file  Physical Activity:   . Days of Exercise per Week: Not on file  . Minutes of Exercise per Session: Not on file  Stress:   . Feeling of Stress : Not on file  Social Connections:   . Frequency of Communication with Friends and Family: Not on file  . Frequency of Social Gatherings with Friends and Family: Not on file  . Attends Religious  Services: Not on file  . Active Member of Clubs or Organizations: Not on file  . Attends Archivist Meetings: Not on file  . Marital Status: Not on file  Intimate Partner Violence:   . Fear of Current or Ex-Partner: Not on file  . Emotionally Abused: Not on file  . Physically Abused: Not on file  . Sexually Abused: Not on file    Family History:   Family History  Problem Relation Age of Onset  . Diabetes type I Father   . Hypertension Mother   . Cancer Brother         Testicular Cancer  . Cancer Brother        Leukemia   Family Status:  Family Status  Relation Name Status  . Father  Deceased       DM/DVT  . Mother  Deceased       old age  . Brother  Alive  . Brother  Alive  . MGM  Deceased  . MGF  Deceased  . PGM  Deceased  . PGF  Deceased  . Brother  (Not Specified)  . Brother  (Not Specified)    ROS:  Please see the history of present illness.  All other ROS reviewed  and negative.     Physical Exam/Data:   Vitals:   09/07/19 0600 09/07/19 0800 09/07/19 0825 09/07/19 0840  BP: (!) 95/46 (!) 79/32 110/63   Pulse: 62  (!) 103   Resp: (!) 24 19 (!) 22   Temp:      TempSrc:      SpO2: 97%  97% 100%  Weight:      Height:        Intake/Output Summary (Last 24 hours) at 09/07/2019 0853 Last data filed at 09/07/2019 0600 Gross per 24 hour  Intake 1082.22 ml  Output 150 ml  Net 932.22 ml   Filed Weights   09/05/19 1317 09/07/19 0400  Weight: (!) 157 kg (!) 168 kg   Body mass index is 47.55 kg/m.  General:  Well nourished, well developed, male in no acute distress HEENT: normal Lymph: no adenopathy Neck: JVD -elevated, but degree is difficult to assess secondary to body habitus Endocrine:  No thryomegaly Vascular: No carotid bruits; upper extremity pulses 2+, lower extremity pulses not able to obtain due to edema Cardiac:  normal S1, S2; irregular rate and rhythm; 2/6 murmur Lungs: Decreased breath sounds bases bilaterally with Rales, no  wheezing, rhonchi Abd: soft, nontender, no hepatomegaly  Ext: 2-3+ lower extremity edema, RUE>LUE but no significant edema noted Musculoskeletal: Left shoulder deformity noted, suspect is old, BUE and BLE strength weak, left weaker than right  Skin: warm and dry, pressure ulcers are dressed Neuro:  CNs 2-12 intact, no focal abnormalities noted Psych:  Normal affect   EKG:  The EKG was personally reviewed and demonstrates: 12/26 ECG is atrial fibrillation, heart rate 71, no acute ischemic changes Telemetry:  Telemetry was personally reviewed and demonstrates: Atrial fibrillation, rate normal-low   CV studies:   ECHO: 09/06/2019  1. Left ventricular ejection fraction, by visual estimation, is 40 to 45%. The left ventricle has mild to moderately decreased function. There is no left ventricular hypertrophy.  2. Moderate hypokinesis of the left ventricular, entire apical segment.  3. Left ventricular diastolic function could not be evaluated.  4. The left ventricle demonstrates regional wall motion abnormalities.  5. Global right ventricle has normal systolic function.The right ventricular size is normal. No increase in right ventricular wall thickness.  6. Left atrial size was moderately dilated.  7. Right atrial size was normal.  8. Moderate pericardial effusion.  9. The pericardial effusion is circumferential. 10. Moderate mitral annular calcification. 11. The mitral valve is normal in structure. Trivial mitral valve regurgitation. 12. The tricuspid valve is normal in structure. 13. The aortic valve is tricuspid. Aortic valve regurgitation is trivial. 14. The pulmonic valve was grossly normal. Pulmonic valve regurgitation is not visualized. 15. TR signal is inadequate for assessing pulmonary artery systolic pressure. 16. The inferior vena cava is dilated in size with >50% respiratory variability, suggesting right atrial pressure of 8 mmHg.   ECHO: 09/04/2017 - Left ventricle: The  cavity size was normal. Wall thickness was   increased in a pattern of mild LVH. Systolic function was normal.   The estimated ejection fraction was in the range of 50% to 55%.   Wall motion was normal; there were no regional wall motion   abnormalities. Doppler parameters are consistent with restrictive   physiology, indicative of decreased left ventricular diastolic   compliance and/or increased left atrial pressure. - Aortic valve: Valve mobility was restricted. There was mild   stenosis. Valve area (VTI): 1.53 cm^2. Valve area (Vmax): 1.4   cm^2.  Valve area (Vmean): 1.38 cm^2. - Aortic root: The aortic root was mildly dilated. - Mitral valve: Calcified annulus.  Impressions:  - Technically difficult; definity used; normal LV systolic   function; mild LVH; restrictive filling; calcified aortic valve   with mild ASl; mildly dilated aortic root.  CATH: 05/04/2015 Coronary angiography:  Coronary dominance:  Right   Left Main:  Normal  Left Anterior Descending (LAD):  Normal in size with mild calcifications. There is diffuse 20-30% disease proximally.  1st diagonal (D1):  Normal in size with 40% mid stenosis.  2nd diagonal (D2):  Normal in size with 40% ostial stenosis. This gives 3 medium-sized branches.   Circumflex (LCx):  Normal in size with minor irregularities.  1st obtuse marginal:  Normal  2nd obtuse marginal:  Absent  3rd obtuse marginal:   Small in size with minor irregularities.              Right Coronary Artery: Normal in size and mildly calcified. There is no evidence of dissection. There is 30-40% stenosis in the midsegment. There is 60% stenosis distally.   Posterior descending artery: Normal in size with minor irregularities.   Posterior AV segment: Occluded.    Left ventriculography: Left ventricular systolic function is moderately reduced , LVEF is estimated at 35-40  %, there is no  significant mitral regurgitation   Final Conclusions:    1. 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 There is moderate stenosis distally with occluded AV segment as documented before. Otherwise, there is mild disease affecting the LAD. 2. Moderately reduced LV systolic function with an ejection fraction of 35-40% due to nonischemic cardiomyopathy. 3. Mild gradient across the aortic valve suggestive of mild stenosis. Mildly elevated left ventricular end-diastolic pressure.   Recommendations: No revascularization is recommended. Continue medical therapy for nonobstructive coronary artery disease and cardiomyopathy.   Laboratory Data:   Chemistry Recent Labs  Lab 09/05/19 2028 09/06/19 0500 09/07/19 0458  NA 125* 125* 128*  K 5.4* 5.5* 5.6*  CL 87* 88* 91*  CO2 31 29 27   GLUCOSE 235* 162* 162*  BUN 39* 40* 47*  CREATININE 1.38* 1.58* 1.77*  CALCIUM 8.4* 8.5* 8.6*  GFRNONAA 51* 43* 38*  GFRAA 59* 50* 44*  ANIONGAP 7 8 10     Lab Results  Component Value Date   ALT 11 09/07/2019   AST 12 (L) 09/07/2019   ALKPHOS 114 09/07/2019   BILITOT 0.8 09/07/2019   Hematology Recent Labs  Lab 09/05/19 1336 09/06/19 0500 09/07/19 0458  WBC 9.7 9.0 9.9  RBC 3.79* 3.64* 3.86*  HGB 10.4* 9.8* 10.5*  HCT 34.8* 33.7* 34.8*  MCV 91.8 92.6 90.2  MCH 27.4 26.9 27.2  MCHC 29.9* 29.1* 30.2  RDW 16.1* 15.9* 16.7*  PLT 329 303 342   Cardiac Enzymes High Sensitivity Troponin:   Recent Labs  Lab 09/05/19 1336 09/05/19 1614 09/07/19 0454  TROPONINIHS 9 10 9       BNP Recent Labs  Lab 09/05/19 1336 09/05/19 2028 09/07/19 0754  BNP 261.4* 204.6* 234.1*    DDimer No results for input(s): DDIMER in the last 168 hours. TSH:  Lab Results  Component Value Date   TSH 2.954 09/15/2015   Lipids: Lab Results  Component Value Date   CHOL 190 05/07/2017   HDL 38 05/07/2017   LDLCALC 92 05/07/2017   TRIG 299 (A) 05/07/2017   CHOLHDL 5.4 04/30/2015   HgbA1c: Lab Results  Component Value Date   HGBA1C 7.7 (H) 09/05/2019   Magnesium:  Magnesium  Date Value Ref Range Status  09/06/2019 2.2 1.7 - 2.4 mg/dL Final    Comment:    Performed at [facility], [place] [address] [place], [place] [address]     Radiology/Studies:  CT CHEST WO CONTRAST  Result Date: 09/06/2019 CLINICAL DATA:  Abnormal chest radiograph, pleural effusion history of bullous pemphigoid EXAM: CT CHEST WITHOUT CONTRAST TECHNIQUE: Multidetector CT imaging of the chest was performed following the standard protocol without IV contrast. COMPARISON:  Same-day radiograph, CT 05/28/2016 FINDINGS: Cardiovascular: There is cardiomegaly with four-chamber enlargement of the heart. Dense mitral annular calcifications are noted. Extensive calcification of the coronary arteries is seen. Calcifications are present on the aortic valve leaflets. Atherosclerotic plaque within the normal caliber aorta. Normal 3 vessel branching of the aortic arch. Proximal great vessels are heavily calcified. Central pulmonary arteries are normal caliber. Luminal evaluation of the vasculature precluded in the absence of contrast Mediastinum/Nodes: Thyroid gland and thoracic inlet are unremarkable. No acute abnormality of the trachea or esophagus. Scattered low-attenuation mediastinal nodes, likely reactive edematous. No pathologically enlarged mediastinal or axillary adenopathy. Hilar adenopathy difficult to assess in the absence of contrast media. Lungs/Pleura: Moderate to large left pleural effusion with associated passive atelectasis with essentially complete collapse of the left lower lobe and segmental collapse of the lingula and posterior segment left upper lobe. Shall trace right pleural effusion is present with more bandlike areas of subsegmental atelectatic change. There are interspersed areas of ground-glass opacity with septal and fissural thickening in the aerated portions of the lungs compatible with  pulmonary edema. Peribronchovascular cuffing is noted. Upper Abdomen: Numerous surgical clips throughout the upper abdomen results in extensive streak artifact which limits evaluation of the adjacent soft tissues. Vascular calcium noted throughout the upper abdomen. Portion of the hepatic flexure is interposed anterior to the left lobe liver. No acute abnormalities present in the visualized portions of the upper abdomen. Musculoskeletal: Anterior wedging at T11 is increased from comparison study 05/28/2016 but has an overall subacute to chronic appearance. Correlate for point tenderness. Multilevel degenerative changes are present in the imaged portions of the spine. Moderate bilateral gynecomastia. IMPRESSION: 1. Moderate to large left pleural effusion with associated passive atelectasis with essentially complete collapse of the left lower lobe and segmental collapse of the lingula and posterior segment left upper lobe. 2. Small right pleural effusion with more bandlike areas of adjacent subsegmental atelectatic change. 3. Ground-glass and septal thickening with vascular cuffing in the lungs compatible with pulmonary edema. 4. Moderate pericardial effusion. Cardiomegaly with four-chamber enlargement of the heart. Dense mitral annular calcifications are noted. 5. Constellation of features are most suggestive of CHF/volume overload. 6. Anterior wedging at T11 is increased from comparison study 05/28/2016 but has an overall subacute to chronic appearance. Correlate for point tenderness. 7. Portion of the hepatic flexure is interposed anterior to the left lobe liver. 8.  Aortic Atherosclerosis (ICD10-I70.0). Electronically Signed   By: Lovena Le M.D.   On: 09/06/2019 00:06   DG Chest Portable 1 View  Result Date: 09/05/2019 CLINICAL DATA:  Shortness of breath.  Hypoxia.  COVID-19. EXAM: PORTABLE CHEST 1 VIEW COMPARISON:  Chest x-ray dated 05/29/2018 FINDINGS: The patient has a new large left pleural effusion.  Air bronchograms are visible at the left lung base. There is minimal aeration remaining in the left upper lobe. There is faint haziness in the right upper lobe which may represent an  early infiltrate. Heart size and pulmonary vascularity are normal. No acute bone abnormality. IMPRESSION: New large left pleural effusion with atelectasis and consolidation in the left lung as described. Possible faint infiltrate in the right upper lobe. Electronically Signed   By: Lorriane Shire M.D.   On: 09/05/2019 15:25   ECHOCARDIOGRAM COMPLETE  Result Date: 09/06/2019   ECHOCARDIOGRAM REPORT   Patient Name:   TALLIE HEVIA Date of Exam: 09/06/2019 Medical Rec #:  [id number]      Height:       74.0 in Accession #:    [id number]     Weight:       346.1 lb Date of Birth:  [birth year]       BSA:          2.74 m Patient Age:    78 years       BP:           89/52 mmHg Patient Gender: M              HR:           59 bpm. Exam Location:  Inpatient Procedure: 2D Echo, Color Doppler, Cardiac Doppler and Intracardiac            Opacification Agent Indications:    CHF 428  History:        Patient has prior history of Echocardiogram examinations, most                 recent 09/09/2017. CHF; Aortic Valve Disease. Covid 19.  Sonographer:    Merrie Roof RDCS Referring Phys: [id number] [place] IMPRESSIONS  1. Left ventricular ejection fraction, by visual estimation, is 40 to 45%. The left ventricle has mild to moderately decreased function. There is no left ventricular hypertrophy.  2. Moderate hypokinesis of the left ventricular, entire apical segment.  3. Left ventricular diastolic function could not be evaluated.  4. The left ventricle demonstrates regional wall motion abnormalities.  5. Global right ventricle has normal systolic function.The right ventricular size is normal. No increase in right ventricular wall thickness.  6. Left atrial size was moderately dilated.  7. Right atrial size was normal.  8. Moderate pericardial effusion.  9.  The pericardial effusion is circumferential. 10. Moderate mitral annular calcification. 11. The mitral valve is normal in structure. Trivial mitral valve regurgitation. 12. The tricuspid valve is normal in structure. 13. The aortic valve is tricuspid. Aortic valve regurgitation is trivial. 14. The pulmonic valve was grossly normal. Pulmonic valve regurgitation is not visualized. 15. TR signal is inadequate for assessing pulmonary artery systolic pressure. 16. The inferior vena cava is dilated in size with >50% respiratory variability, suggesting right atrial pressure of 8 mmHg. FINDINGS  Left Ventricle: Left ventricular ejection fraction, by visual estimation, is 40 to 45%. The left ventricle has mild to moderately decreased function. Moderate hypokinesis of the left ventricular, entire apical segment. The left ventricle demonstrates regional wall motion abnormalities. The left ventricular internal cavity size was the left ventricle is normal in size. There is no left ventricular hypertrophy. The left ventricular diastology could not be evaluated due to atrial fibrillation. Left ventricular diastolic function could not be evaluated. Right Ventricle: The right ventricular size is normal. No increase in right ventricular wall thickness. Global RV systolic function is has normal systolic function. Left Atrium: Left atrial size was moderately dilated. Right Atrium: Right atrial size was normal in size Pericardium: A moderately sized pericardial effusion is present. The pericardial effusion is  circumferential. There is no evidence of cardiac tamponade. Mitral Valve: The mitral valve is normal in structure. Moderate mitral annular calcification. Trivial mitral valve regurgitation. Tricuspid Valve: The tricuspid valve is normal in structure. Tricuspid valve regurgitation is not demonstrated. Aortic Valve: The aortic valve is tricuspid. . There is moderate thickening and moderate calcification of the aortic valve. Aortic  valve regurgitation is trivial. There is moderate thickening of the aortic valve. There is moderate calcification of the aortic valve. Pulmonic Valve: The pulmonic valve was grossly normal. Pulmonic valve regurgitation is not visualized. Pulmonic regurgitation is not visualized. Aorta: The aortic root and ascending aorta are structurally normal, with no evidence of dilitation. Venous: IVC assessment for right atrial pressure unable to be performed due to mechanical ventilation. The inferior vena cava is dilated in size with greater than 50% respiratory variability, suggesting right atrial pressure of 8 mmHg. IAS/Shunts: No atrial level shunt detected by color flow Doppler.  Sanda Klein MD Electronically signed by Sanda Klein MD Signature Date/Time: 09/06/2019/2:35:11 PM    Final     Assessment and Plan:   1.  Acute on chronic respiratory failure with hypoxia -At one point, thoracentesis was considered for his left pleural effusion -Per CCM  2.  Acute on chronic combined systolic and diastolic CHF: -EF was decreased in 2016 at cath to 35-40%, but was normal at echocardiogram 2 months later. -Echocardiogram results above.  EF now 40-45% with apical wall motion abnormality -At HStroponin normal this admission -He got Lasix 80 mg x 1 on 12/26, no Lasix on 12/27 and is currently on Lasix 40 mg IV twice daily -Intake/output is incomplete with only 150 cc reported out since admission, weight has gone up 24 pounds in 48 hours -Discuss with MD increasing Lasix dose  3.  Abnormal echocardiogram -EF is decreased and wall motion abnormality is new. -Discuss with MD the possibility of an out of hospital MI -At this time, he is not a candidate for ischemic/invasive evaluation. -Continue to monitor for symptoms. - he is on aspirin 81 mg daily and beta-blocker.  Unable to increase the dose due to bradycardia -He is on statin, Crestor 5 mg daily.  At last check, LDL was 92.  He has had problems  tolerating Lipitor in the past, discuss increasing the Crestor.  4.  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   Brian Speak, [name]  09/07/2019 8:53 AM   Patient seen and examined with Brian Ferries, [name].  Agree as above, with the following exceptions and changes as noted below.  Patient is uncomfortable lying in bed, and tells me when I ask him what we can do to  make him feel better, "to be honest my head is swimming, I have seen so many doctors here". Gen: Appears fatigued, increased work of breathing, CV: RRR, no murmurs, Lungs: clear, Abd: soft, Extrem: Warm, well perfused, 2-3+ edema, Neuro/Psych: alert and oriented x 3, normal mood and affect. All available labs, radiology testing, previous records reviewed.   Medically complex patient with multiple comorbidities and acute issues.  From a cardiovascular perspective, primary of which is moderate pericardial effusion and evidence of volume overload on exam.  I have independently reviewed the echocardiogram and while there are no compelling signs of  cardiac tamponade on echo or by exam, I would recommend reevaluating the pericardial effusion in 2 to 3 days by limited echo given very challenging physical exam and blood pressure recording due to body habitus.  No clear signs of RV diastolic collapse, however the IVC is dilated though still has some respiratory collapse.  Most likely scenario is pericardial and pleural effusions from volume overload, and a trial of Lasix at a higher dose would be appropriate.  Creatinine is rising however this may be cardiorenal in nature.  Trial of Lasix is warranted. -lasix 80 mg IV BID.  Ejection fraction is newly reduced and there is a LAD distribution wall motion abnormality.  In the patient's current state I suspect he would not tolerate lying flat for right and left heart catheterization, however that would be a very useful test at this time.  We will attempt to diurese the patient for more comfort and discussed with him his goals.  An outpatient ischemic evaluation can be performed when the patient has improved.  We will continue to monitor, and fortunately his troponin has been unremarkable this admission.  Medical management of CAD at this time.  Continue ASA 81 mg daily, metoprolol succinate 25 mg daily as tolerated, Crestor 5 mg daily.  History of statin intolerance with higher doses.  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  Continue anticoagulation.  Pending evaluation of ischemia, if disease is felt to be stable can consider Xarelto monotherapy for stable ischemic heart disease.  Can be discussed as an outpatient if further work-up pursued.  Elouise Munroe, MD 09/07/19 1:41 PM

## 2019-09-07 NOTE — Progress Notes (Signed)
Left upper extremity venous duplex completed.  09/07/2019 11:10 AM Maudry Mayhew, MHA, RVT, RDCS, RDMS

## 2019-09-07 NOTE — Consult Note (Signed)
WOC Nurse Consult Note: Reason for Consult:Moisture associated skin damage to buttocks sacrum and posterior thigh.  Chronic indwelling foley. COntact with dermatherapy therapeutic linen will hopefully improve skin microclimate.  Will add barrier cream as well. Trauma wounds to toes on bilateral feet from transfers.  He states he has been unable to stand or walk for some time. Scabbed dry lesions. Chronic skin lesions to bilateral lower legs.  History of CAD, no ABI on file, will not implement compression at this time. History bullous pemphigoid, no exacerbation seen at this time.  Wound type:MASD to buttocks/sacrum and posterior thighs and trauma to toes Pressure Injury POA: Yes  Pressure and moisture to sacrum.  Bed immobility.  Measurement:Scattered nonintact lesions to posterior trunk.  Dry chronic skin changes to lower legs. 0.3 cm scabbed nail beds to left second and third toe and right great toe.    Wound JHH:IDUP and moist  Scabbed to toes.  Drainage (amount, consistency, odor) Legs are edematous and weeping.  Periwound:chronic skin changes.  Dressing procedure/placement/frequency: Cleanse bilateral lower legs with soap and water and leave open to air.  Apply mupirocin cream to scabbed lesions on toes daily.  Cover with dry dressing.  Gerhardts to buttocks, sacrum and posterior thigh. No disposable briefs or underpads. Exposure to dermatherapy linen will be beneficial.  Will not follow at this time.  Please re-consult if needed.  Domenic Moras MSN, RN, FNP-BC CWON Wound, Ostomy, Continence Nurse Pager [phone]

## 2019-09-07 NOTE — Consult Note (Signed)
Consultation Note Date: 09/07/2019   Patient Name: [name]  DOB: 03-26-1946  MRN: [id number]  Age / Sex: 73 y.o., male  PCP: Patient, No Pcp Per Referring Physician: Caren Griffins, MD  Reason for Consultation: Establishing goals of care  HPI/Patient Profile: 73 y.o. male  admitted on 09/05/2019  73 year old man who lives at [facility] facility, with history of obesity, bedbound, bullous pemphigoid, recent COVID infection presenting with acute on chronic hypoxemic and hypercarbic respiratory failure.  Patient initially required BiPAP.  Chest x-ray revealed a left-sided pleural effusion and possible bilateral airspace disease.   .  Currently, the patient is in stepdown unit at [facility], PCCM is following.   Clinical Assessment and Goals of Care:  Palliative consult for goals of care discussions. [name] is resting in bed. He has also been seen by cardiology for acute on chronic combined heart failure, A fib management. There is also concern for the patient having an abnormal ECHO, new wall motion abnormalities, raising concern for possible out of hospital MI.   He also has a large pleural effusion, underwent thoracentesis.   [name] states that he doesn't have chest pain anymore, he denies shortness of breath. He is attempting to eat a cheese stick.   Palliative medicine is specialized medical care for people living with serious illness. It focuses on providing relief from the symptoms and stress of a serious illness. The goal is to improve quality of life for both the patient and the family.  Goals of care: Broad aims of medical therapy in relation to the patient's values and preferences. Our aim is to provide medical care aimed at enabling patients to achieve the goals that matter most to them, given the circumstances of their particular medical situation and their constraints.    Patient is able to recall that he resides at [facility], he isn't sure what brought him to the hospital.   We talked about his current condition and what is most important to him. Patient hopes to get better. Reviewed with him about his current treatments. Call placed but unable to reach Brian at this time. PMT to follow. Thank you for the consult.   NEXT OF KIN  [name] is next of kin  [phone]   [place] with DNR Continue current mode of care Recommend Palliative services following Brian Sainsbury when he is stable enough to be d/c back to [facility].  Thank you for the consult.   Code Status/Advance Care Planning:  DNR    Symptom Management:    med history noted, continue current mode of care.   Palliative Prophylaxis:   Delirium Protocol   Psycho-social/Spiritual:   Desire for further Chaplaincy support:yes  Additional Recommendations: Caregiving  Support/Resources  Prognosis:   Unable to determine  Discharge Planning: recommend back to [facility] with Palliative care service follow-up      Primary Diagnoses: Present on Admission: . Uncontrolled type II diabetes with  peripheral autonomic neuropathy ([place]) . Morbid obesity ([place]) . Chronic diastolic CHF (congestive heart failure) ([place]) . Neurogenic bladder . Hyperlipidemia LDL goal <70 . GERD (gastroesophageal reflux disease) . Acute respiratory failure with hypoxia ([place]) . Hypercapnic respiratory failure ([place])   I have reviewed the medical record, interviewed the patient and family, and examined the patient. The following aspects are pertinent.  Past Medical History:  Diagnosis Date  . Arthritis    "hands, ankles, knees" (11/10/2014)  . Bladder spasms   . CAD (coronary artery disease)   . CAD in native artery 09/15/2015  . CHF (congestive heart failure) ([place])   . Chronic indwelling Foley catheter   . Chronic lower back pain   . Depression   . Diabetic  diarrhea ([place])   . Diabetic neuropathy, painful ([place])   . DVT (deep venous thrombosis) (HCC) 1980's   LLE  . GERD (gastroesophageal reflux disease)   . Hyperlipidemia   . Hypertension   . Neurogenic bladder   . Obese   . OSA (obstructive sleep apnea)    "they wanted me to wear a mask; I couldn't" (11/10/2014)  . Pneumonia 1999  . PONV (postoperative nausea and vomiting)   . Stroke [facility]) 10/2011; 06/2014   right hand numbness/notes 10/20/2011, pt not sure he had stroke in 2013; "right hand weaker and right face not quite right since" (11/10/2014)  . Type II diabetes mellitus ([place])   . 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 Marital status: Widowed    Spouse name: Not on file  . Number of children: Not on file  . Years of education: Not on file  . Highest education level: Not on file  Occupational History  . Occupation: Retired  Tobacco Use  . Smoking status: Former Smoker    Packs/day: 2.00    Years: 20.00    Pack years: 40.00    Types: Cigarettes    Quit date: 09/10/1976    Years since quitting: 43.0  . Smokeless tobacco: Never Used  Substance and Sexual Activity  . Alcohol use: No    Alcohol/week: 0.0 standard drinks    Comment: FORMER ALCOHOLIC; "sober since 81/27/5170"  . Drug use: No  . Sexual activity: Never  Other Topics Concern  . Not on file  Social History Narrative  . Not on file   Social Determinants of Health   Financial Resource Strain:   . Difficulty of Paying Living Expenses: Not on file  Food Insecurity:   . Worried About Charity fundraiser in the Last Year: Not on file  . Ran Out of Food in the Last Year: Not on file  Transportation Needs:   . Lack of Transportation (Medical): Not on file  . Lack of Transportation (Non-Medical): Not on file  Physical Activity:   . Days of Exercise per Week: Not on file  . Minutes of Exercise per Session: Not on file  Stress:     . Feeling of Stress : Not on file  Social Connections:   . Frequency of Communication with Friends and Family: Not on file  . Frequency of Social Gatherings with Friends and Family: Not on file  . Attends Religious Services: Not on file  . Active Member of Clubs or Organizations: Not on file  . Attends Archivist Meetings: Not on file  . Marital Status: Not on file   Family History  Problem Relation Age of Onset  .  Diabetes type I Father   . Hypertension Mother   . Cancer Brian         Testicular Cancer  . Cancer Brian        Leukemia   Scheduled Meds: . alum & mag hydroxide-simeth  30 mL Oral Once  . amLODipine  5 mg Oral Daily  . arformoterol  15 mcg Nebulization BID  . vitamin C  500 mg Oral BID  . aspirin EC  81 mg Oral Daily  . budesonide (PULMICORT) nebulizer solution  0.25 mg Nebulization BID  . Chlorhexidine Gluconate Cloth  6 each Topical Daily  . docusate sodium  100 mg Oral BID  . doxycycline  100 mg Oral Q12H  . escitalopram  20 mg Oral Daily  . ezetimibe  10 mg Oral Daily  . ferrous sulfate  325 mg Oral BID WC  . folic acid  1 mg Oral Daily  . furosemide  80 mg Intravenous Q12H  . gabapentin  300 mg Oral TID  . Gerhardt's butt cream   Topical BID  . insulin aspart  0-20 Units Subcutaneous Q4H  . insulin glargine  10 Units Subcutaneous Daily  . levothyroxine  25 mcg Oral QAC breakfast  . magnesium oxide  400 mg Oral Daily  . metoprolol succinate  25 mg Oral Daily  . multivitamin with minerals  1 tablet Oral Daily  . mupirocin ointment   Topical Daily  . pantoprazole  40 mg Oral Daily  . predniSONE  10 mg Oral Q breakfast  . rivaroxaban  10 mg Oral Daily  . rOPINIRole  0.5 mg Oral QHS  . rosuvastatin  5 mg Oral Daily  . zinc sulfate  220 mg Oral Daily   Continuous Infusions: PRN Meds:.guaiFENesin, ipratropium-albuterol, nitroGLYCERIN, oxyCODONE, traZODone Medications Prior to Admission:  Prior to Admission medications   Medication Sig  Start Date End Date Taking? Authorizing Provider  acetaminophen (TYLENOL) 500 MG tablet Take 1,000 mg by mouth every 8 (eight) hours as needed.  01/10/18  Yes [provider]  aluminum-magnesium hydroxide-simethicone (MAALOX) 976-734-19 MG/5ML SUSP Take 30 mLs by mouth every 6 (six) hours as needed (indigestion).    Yes [provider]  amLODipine (NORVASC) 5 MG tablet Take 5 mg by mouth daily.   Yes [provider]  aspirin EC 81 MG tablet Take 81 mg by mouth daily.   Yes [provider]  b complex vitamins tablet Take 1 tablet by mouth daily.   Yes [provider]  bisacodyl (DULCOLAX) 10 MG suppository Place 10 mg rectally daily as needed for moderate constipation. 04/19/18  Yes [provider]  cholecalciferol (VITAMIN D) 25 MCG (1000 UT) tablet Take 2,000 Units by mouth daily. For 14 days starting 12.15.2020   Yes [provider]  Cholecalciferol 1000 units tablet Take 50,000 Units by mouth every 14 (fourteen) days.  12/05/17  Yes [provider]  diclofenac sodium (VOLTAREN) 1 % GEL Apply topically 3 (three) times daily. Apply to left shoulder topically three times daily    Yes [provider]  docusate sodium (COLACE) 100 MG capsule Take 100 mg by mouth 2 (two) times daily.   Yes [provider]  escitalopram (LEXAPRO) 20 MG tablet Take 20 mg by mouth daily.    Yes [provider]  ezetimibe (ZETIA) 10 MG tablet Take 10 mg by mouth daily.   Yes [provider]  ferrous sulfate 325 (65 FE) MG tablet Take 325 mg by mouth 2 (two)  times daily with a meal.   Yes [provider]  folic acid (FOLVITE) 1 MG tablet Take 1 mg by mouth daily.   Yes [provider]  gabapentin (NEURONTIN) 300 MG capsule Take 300 mg by mouth 3 (three) times daily.    Yes [provider]  guaiFENesin (MUCINEX) 600 MG 12 hr tablet Take 600 mg by mouth 2 (two) times daily as needed for cough.     Yes [provider]  insulin lispro (HUMALOG) 100 UNIT/ML injection Inject 2-12 Units into the skin See admin instructions. CBG 151-200 =2UNITS CBG 201-250 =4UNITS CBG 251-300 =6UNITS CBG 301-350 =8UNITS CBG 351-400= 10 UNITS CBG 401+ =12UNITS CALL MD IF CBG ABOVE 450 03/06/18  Yes [provider]  ipratropium-albuterol (DUONEB) 0.5-2.5 (3) MG/3ML SOLN Take 3 mLs by nebulization every 6 (six) hours as needed (sob).    Yes [provider]  levothyroxine (SYNTHROID) 25 MCG tablet Take 25 mcg by mouth daily before breakfast.   Yes [provider]  magnesium oxide (MAG-OX) 400 MG tablet Take 400 mg by mouth daily.   Yes [provider]  metoprolol succinate (TOPROL-XL) 25 MG 24 hr tablet Take 25 mg by mouth daily.   Yes [provider]  Multiple Vitamin (MULTIVITAMIN) tablet Take 1 tablet by mouth daily.   Yes [provider]  nitroGLYCERIN (NITROSTAT) 0.4 MG SL tablet Place 0.4 mg under the tongue every 5 (five) minutes as needed for chest pain.   Yes [provider]  oxyCODONE (OXY IR/ROXICODONE) 5 MG immediate release tablet Take 5 mg by mouth every 8 (eight) hours as needed for severe pain.   Yes [provider]  pantoprazole (PROTONIX) 40 MG tablet Take 40 mg by mouth daily.   Yes [provider]  polyethylene glycol (MIRALAX / GLYCOLAX) packet Take 17 g by mouth daily as needed for mild constipation or moderate constipation. 04/19/18  Yes [provider]  predniSONE (DELTASONE) 10 MG tablet Take 10 mg by mouth daily.   Yes [provider]  rivaroxaban (XARELTO) 10 MG TABS tablet Take 10 mg by mouth daily.   Yes [provider]  rOPINIRole (REQUIP) 0.5 MG tablet Take 0.5 mg by mouth at bedtime. 2100   Yes [provider]  rosuvastatin (CRESTOR) 5 MG tablet Take 5 mg by mouth daily.   Yes [provider]  simethicone (MYLICON) 622 MG chewable tablet Chew 125 mg by  mouth every 6 (six) hours as needed for flatulence. 03/19/18  Yes [provider]  traZODone (DESYREL) 100 MG tablet Take 150 mg by mouth at bedtime.  04/04/18  Yes [provider]  vitamin C (ASCORBIC ACID) 500 MG tablet Take 500 mg by mouth 2 (two) times daily.   Yes [provider]  zinc gluconate 50 MG tablet Take 100 mg by mouth daily. For 14 days starting 12.15.2020   Yes [provider]  NON FORMULARY Low concentrated sweets diet - Regular texture, NAS (no added salt)    [provider]  OXYGEN Inhale into the lungs. O2 at 2l/min via nasal canula as needed for shortness of breath    [provider]   Allergies  Allergen Reactions  . Ace Inhibitors Swelling    Pt tolerates lisinopril  . Lipitor [Atorvastatin Calcium] Swelling  . Metformin And Related Swelling  . Atorvastatin Calcium   . Morphine And Related Other (See Comments)    Sweating, feels like is "in rocky boat."  . Robaxin [  Methocarbamol] Other (See Comments)    Feels like he is shaky   Review of Systems Denies chest pain  Physical Exam Somewhat large body habitus, no distress Diminished bases lung sounds Distant heart sounds Diffuse edema Awake reasonably alert, answers very few questions appropriately  Vital Signs: BP (!) 109/50 (BP Location: Left Arm)   Pulse (!) 52   Temp (!) 97.4 F (36.3 C) (Axillary)   Resp (!) 27   Ht 6\' 2"  (1.88 m)   Wt (!) 168 kg   SpO2 99%   BMI 47.55 kg/m  Pain Scale: 0-10   Pain Score: 2    SpO2: SpO2: 99 % O2 Device:SpO2: 99 % O2 Flow Rate: .O2 Flow Rate (L/min): 2 L/min  IO: Intake/output summary:   Intake/Output Summary (Last 24 hours) at 09/07/2019 1413 Last data filed at 09/07/2019 1200 Gross per 24 hour  Intake 1244.53 ml  Output 150 ml  Net 1094.53 ml    LBM: Last BM Date: 09/06/19 Baseline Weight: Weight: (!) 157 kg Most recent weight: Weight: (!) 168 kg     Palliative Assessment/Data:   PPS  40%  Time In:  1300 Time Out:  1400 Time Total:  60  Greater than 50%  of this time was spent counseling and coordinating care related to the above assessment and plan.  Signed by: Loistine Chance, MD   Please contact Palliative Medicine Team phone at [phone] for questions and concerns.  For individual provider: See Shea Evans

## 2019-09-07 NOTE — Progress Notes (Signed)
Notified Dr. Cruzita Lederer of pt complaining of sharp chest pain. Pt had similar complaint of chest pain on night shift, per night RN. High sensitivity troponin at 0450 was 9. Maalox ordered and given. Will continue to monitor

## 2019-09-08 DIAGNOSIS — J9621 Acute and chronic respiratory failure with hypoxia: Secondary | ICD-10-CM

## 2019-09-08 LAB — CBC WITH DIFFERENTIAL/PLATELET
Abs Immature Granulocytes: 0.2 10*3/uL — ABNORMAL HIGH (ref 0.00–0.07)
Basophils Absolute: 0 10*3/uL (ref 0.0–0.1)
Basophils Relative: 0 %
Eosinophils Absolute: 0 10*3/uL (ref 0.0–0.5)
Eosinophils Relative: 0 %
HCT: 34 % — ABNORMAL LOW (ref 39.0–52.0)
Hemoglobin: 10.1 g/dL — ABNORMAL LOW (ref 13.0–17.0)
Immature Granulocytes: 2 %
Lymphocytes Relative: 5 %
Lymphs Abs: 0.5 10*3/uL — ABNORMAL LOW (ref 0.7–4.0)
MCH: 26.5 pg (ref 26.0–34.0)
MCHC: 29.7 g/dL — ABNORMAL LOW (ref 30.0–36.0)
MCV: 89.2 fL (ref 80.0–100.0)
Monocytes Absolute: 0.6 10*3/uL (ref 0.1–1.0)
Monocytes Relative: 5 %
Neutro Abs: 9.7 10*3/uL — ABNORMAL HIGH (ref 1.7–7.7)
Neutrophils Relative %: 88 %
Platelets: 289 10*3/uL (ref 150–400)
RBC: 3.81 MIL/uL — ABNORMAL LOW (ref 4.22–5.81)
RDW: 16.9 % — ABNORMAL HIGH (ref 11.5–15.5)
WBC: 11 10*3/uL — ABNORMAL HIGH (ref 4.0–10.5)
nRBC: 0.5 % — ABNORMAL HIGH (ref 0.0–0.2)

## 2019-09-08 LAB — COMPREHENSIVE METABOLIC PANEL
ALT: 13 U/L (ref 0–44)
AST: 15 U/L (ref 15–41)
Albumin: 2.2 g/dL — ABNORMAL LOW (ref 3.5–5.0)
Alkaline Phosphatase: 109 U/L (ref 38–126)
Anion gap: 10 (ref 5–15)
BUN: 51 mg/dL — ABNORMAL HIGH (ref 8–23)
CO2: 25 mmol/L (ref 22–32)
Calcium: 8.1 mg/dL — ABNORMAL LOW (ref 8.9–10.3)
Chloride: 89 mmol/L — ABNORMAL LOW (ref 98–111)
Creatinine, Ser: 2.06 mg/dL — ABNORMAL HIGH (ref 0.61–1.24)
GFR calc Af Amer: 36 mL/min — ABNORMAL LOW (ref >60)
GFR calc non Af Amer: 31 mL/min — ABNORMAL LOW (ref >60)
Glucose, Bld: 171 mg/dL — ABNORMAL HIGH (ref 70–99)
Potassium: 5.7 mmol/L — ABNORMAL HIGH (ref 3.5–5.1)
Sodium: 124 mmol/L — ABNORMAL LOW (ref 135–145)
Total Bilirubin: 0.6 mg/dL (ref 0.3–1.2)
Total Protein: 5.4 g/dL — ABNORMAL LOW (ref 6.5–8.1)

## 2019-09-08 LAB — PH, BODY FLUID: pH, Body Fluid: 7.7

## 2019-09-08 LAB — GLUCOSE, CAPILLARY
Glucose-Capillary: 167 mg/dL — ABNORMAL HIGH (ref 70–99)
Glucose-Capillary: 137 mg/dL — ABNORMAL HIGH (ref 70–99)
Glucose-Capillary: 165 mg/dL — ABNORMAL HIGH (ref 70–99)
Glucose-Capillary: 191 mg/dL — ABNORMAL HIGH (ref 70–99)
Glucose-Capillary: 225 mg/dL — ABNORMAL HIGH (ref 70–99)

## 2019-09-08 LAB — FERRITIN: Ferritin: 71 ng/mL (ref 24–336)

## 2019-09-08 LAB — C-REACTIVE PROTEIN: CRP: 1 mg/dL — ABNORMAL HIGH (ref <1.0)

## 2019-09-08 LAB — POTASSIUM: Potassium: 5.6 mmol/L — ABNORMAL HIGH (ref 3.5–5.1)

## 2019-09-08 LAB — PATHOLOGIST SMEAR REVIEW

## 2019-09-08 MED ORDER — INSULIN ASPART 100 UNIT/ML ~~LOC~~ SOLN
0.0000 [IU] | Freq: Three times a day (TID) | SUBCUTANEOUS | Status: DC
  Administered 2019-09-08 (×2): 2 [IU] via SUBCUTANEOUS
  Administered 2019-09-09: 3 [IU] via SUBCUTANEOUS
  Administered 2019-09-09 – 2019-09-10 (×4): 2 [IU] via SUBCUTANEOUS
  Administered 2019-09-10 – 2019-09-11 (×2): 3 [IU] via SUBCUTANEOUS
  Administered 2019-09-11 – 2019-09-12 (×4): 2 [IU] via SUBCUTANEOUS
  Administered 2019-09-12: 17:00:00 3 [IU] via SUBCUTANEOUS
  Administered 2019-09-13 – 2019-09-14 (×4): 2 [IU] via SUBCUTANEOUS
  Administered 2019-09-14: 3 [IU] via SUBCUTANEOUS
  Administered 2019-09-14: 10:00:00 2 [IU] via SUBCUTANEOUS

## 2019-09-08 MED ORDER — HYDROCERIN EX CREA
TOPICAL_CREAM | Freq: Two times a day (BID) | CUTANEOUS | Status: DC
  Administered 2019-09-11: 1 via TOPICAL
  Filled 2019-09-08 (×3): qty 113

## 2019-09-08 MED ORDER — ALBUMIN HUMAN 25 % IV SOLN
12.5000 g | Freq: Once | INTRAVENOUS | Status: AC
  Administered 2019-09-08: 12.5 g via INTRAVENOUS
  Filled 2019-09-08: qty 50

## 2019-09-08 MED ORDER — INSULIN ASPART 100 UNIT/ML ~~LOC~~ SOLN
0.0000 [IU] | Freq: Every day | SUBCUTANEOUS | Status: DC
  Administered 2019-09-08 – 2019-09-13 (×5): 2 [IU] via SUBCUTANEOUS

## 2019-09-08 MED ORDER — SODIUM ZIRCONIUM CYCLOSILICATE 10 G PO PACK
10.0000 g | PACK | Freq: Once | ORAL | Status: AC
  Administered 2019-09-08: 10 g via ORAL
  Filled 2019-09-08: qty 1

## 2019-09-08 MED ORDER — RIVAROXABAN 20 MG PO TABS
20.0000 mg | ORAL_TABLET | Freq: Every day | ORAL | Status: DC
  Administered 2019-09-08 – 2019-09-09 (×2): 20 mg via ORAL
  Filled 2019-09-08 (×2): qty 1

## 2019-09-08 NOTE — Progress Notes (Signed)
Pulmonary Follow up Note  Date of Service: 09/08/2019  CC: AMS  HPI: 73 year old man with history of obesity, bedbound, bullous pemphigoid, recent COVID infection presenting with acute on chronic hypoxemic and hypercarbic respiratory failure.  Patient improved BiPAP.  Chest x-ray revealed a left-sided pleural effusion and possible bilateral airspace disease.  Pulmonology consulted for further management.  Currently, the patient is not sure why he is here.  He denies any cough or shortness of breath.  He has diffuse pain especially in his back, this is chronic.  See medical history below for merit of chronic medical problems.  Phys Exam  GEN: Obese elderly man lying in bed, awake and alert, appropriate HEENT: Mallampati 4, NCAT, mucous membranes moist CV: S1, S2,, Irr, a fib per tele, extremities warm, BLE wrapped PULM: Bilateral chest excursion, Lung sounds are diminished at bases, few crackles notes per bases, difficult to auscultate, thoracentesis site unremarkable GI: Protuberant, soft, positive bowel sounds, ND, NT, Body mass index is 47.55 kg/m. EXT: He has global anasarca, legs wrapped, cap refill < 3 sec NEURO: Moves all 4 extremities to command, unable to sit up or raise arms or legs against gravity, awake and alert, follows commands, alert to person , knows hospital, not which one  PSYCH: Poor insight, answering questions appropriately, pleasant SKIN: He has multiple areas of Candida in the skin folds  Studies Thoracentesis 12/28 Fluid Type-FCT  PLEURAL   Color, Fluid YELLOW YELLOWAbnormal    Appearance, Fluid CLEAR CLEAR   Total Nucleated Cell Count, Fluid 0 - 1,000 cu mm 56   Neutrophil Count, Fluid 0 - 25 % 14   Lymphs, Fluid % 17   Monocyte-Macrophage-Serous Fluid 50 - 90 % 69   Body Fluid Protein>> Less than 3.0 Albumin < 1.0 LDH 56 PH 7.7  No WBC Labs show a low sodium, high potassium, chronically elevated creatinine, BNP is 234, CRP and procalcitonin are  benign CBC essentially normal except for chronic anemia Pathology >>REACTIVE MESOTHELIAL CELLS AND  MACROPHAGES PRESENT, NO ATYPIA SEEN.  Body Fluid culture 12/28>> Pending  Assessment # Left pleural effusion- in setting of recent COVID infection, CKD, and bedbound status, looks very clear on tap; suspect benign related to third spacing Thoracentesis 12/28 with 1300 cc clear pleural fluid  Drained Net + 1700 MRSA PCR positive # Hyponatremia, hyperkalemia in a patient on chronic steroids, consider adrenal insufficiency although suppose end stage heart failure could appear similar (he does not really look like end stage heart failure) # Chronic foley # T2DM # Bullous pemphigoid- assume this is why on steroids? # Frail elderly- palliative consult ordered.  Baseline bedbound, dependent for ADLs.  Plan - Left effusion tapped and sent for usual studies>> 1300 cc drained - Trend CXR - Aggressive Pulmonary Toilet, IS Q 1 while awake - Titrate Oxygen to maintain sats > 94% - OOB to chair as able - Diuresis on hold>> ? contraction alkalosis, BUN/ Creatinine up trending, fluid overload on exam - Hold SSRI - Lokelma for K of 5.7>> follow up labs to be drawn in  2 hours - TSH>> 3,455 - MRSA PCR is +:  - Continue Doxycycline - Cortisol 5.7 on 12/28>>, consider stim test or empiric hydrocortisone if becomes hypotensive - Palliative consult is reasonable - Will f/u tomorrow, continue floor status for now - HF per cards  Magdalen Spatz, MSN, AGACNP-BC [facility] Pager # [phone] After 4 pm please call [phone] 09/08/2019 1:56 PM

## 2019-09-08 NOTE — Progress Notes (Addendum)
Progress Note  Patient Name: [name] Date of Encounter: 09/08/2019  Primary Cardiologist:  Candee Furbish, MD  Subjective   Says he is breathing a little bit better than yesterday.  Wonders why he is here.  No awareness of the atrial fibrillation.  Inpatient Medications    Scheduled Meds:  amLODipine  5 mg Oral Daily   arformoterol  15 mcg Nebulization BID   vitamin C  500 mg Oral BID   aspirin EC  81 mg Oral Daily   budesonide (PULMICORT) nebulizer solution  0.25 mg Nebulization BID   Chlorhexidine Gluconate Cloth  6 each Topical Daily   Chlorhexidine Gluconate Cloth  6 each Topical Q0600   docusate sodium  100 mg Oral BID   doxycycline  100 mg Oral Q12H   escitalopram  20 mg Oral Daily   ezetimibe  10 mg Oral Daily   ferrous sulfate  325 mg Oral BID WC   folic acid  1 mg Oral Daily   furosemide  80 mg Intravenous BID   gabapentin  300 mg Oral TID   Gerhardt's butt cream   Topical BID   insulin aspart  0-5 Units Subcutaneous QHS   insulin aspart  0-9 Units Subcutaneous TID WC   insulin glargine  10 Units Subcutaneous Daily   levothyroxine  25 mcg Oral QAC breakfast   magnesium oxide  400 mg Oral Daily   metoprolol succinate  25 mg Oral Daily   multivitamin with minerals  1 tablet Oral Daily   mupirocin ointment  1 application Nasal BID   mupirocin ointment   Topical Daily   pantoprazole  40 mg Oral Daily   predniSONE  10 mg Oral Q breakfast   rivaroxaban  10 mg Oral Daily   rOPINIRole  0.5 mg Oral QHS   rosuvastatin  5 mg Oral Daily   zinc sulfate  220 mg Oral Daily   Continuous Infusions:  PRN Meds: guaiFENesin, ipratropium-albuterol, nitroGLYCERIN, oxyCODONE, traZODone   Vital Signs    Vitals:   09/07/19 2000 09/07/19 2010 09/07/19 2137 09/08/19 0432  BP:  (!) 128/58 95/75 (!) 123/109  Pulse:  (!) 51 (!) 51 98  Resp:  18 19 16   Temp: (!) 97.5 F (36.4 C)  98.6 F (37 C) 98.9 F (37.2 C)  TempSrc: Oral     SpO2:   100% 100% 97%  Weight:      Height:        Intake/Output Summary (Last 24 hours) at 09/08/2019 0817 Last data filed at 09/08/2019 0500 Gross per 24 hour  Intake 516.11 ml  Output 200 ml  Net 316.11 ml   Filed Weights   09/05/19 1317 09/07/19 0400  Weight: (!) 157 kg (!) 168 kg   Last Weight  Most recent update: 09/07/2019  4:00 AM   Weight  168 kg (370 lb 6 oz)             Weight change:    Telemetry    Afib, rate ok, no sig ectopy. Rate is not sustained < 50, but does drop into the 50s while asleep - Personally Reviewed  ECG    12/26 ECG is atrial fibrillation, heart rate 71, no acute ischemic changes - Personally Reviewed  Physical Exam   General: Chronically ill appearing, morbidly obese, male in no acute distress Head: Eyes PERRLA, Head normocephalic and atraumatic Lungs: some rales bases bilaterally to auscultation. Heart: Irreg R&R. S1 S2, without rub or gallop. 2/6 murmur. 4/4  extremity pulses are 2+ & equal. JVD seen, approx 9 cm. Abdomen: Bowel sounds are present, abdomen soft and non-tender without masses or  hernias noted. Msk: Normal strength and tone for age. Extremities: No clubbing, cyanosis, 2-3+ LE edema.    Skin:  No rashes or lesions noted. Neuro: Alert and oriented X 2. Psych:  responds appropriately  Labs    Hematology Recent Labs  Lab 09/06/19 0500 09/07/19 0458 09/08/19 0527  WBC 9.0 9.9 11.0*  RBC 3.64* 3.86* 3.81*  HGB 9.8* 10.5* 10.1*  HCT 33.7* 34.8* 34.0*  MCV 92.6 90.2 89.2  MCH 26.9 27.2 26.5  MCHC 29.1* 30.2 29.7*  RDW 15.9* 16.7* 16.9*  PLT 303 342 289    Chemistry Recent Labs  Lab 09/06/19 0500 09/07/19 0458 09/08/19 0527  NA 125* 128* 124*  K 5.5* 5.6* 5.7*  CL 88* 91* 89*  CO2 29 27 25   GLUCOSE 162* 162* 171*  BUN 40* 47* 51*  CREATININE 1.58* 1.77* 2.06*  CALCIUM 8.5* 8.6* 8.1*  PROT 5.5* 5.7* 5.4*  ALBUMIN 2.0* 2.4* 2.2*  AST 11* 12* 15  ALT 11 11 13   ALKPHOS 106 114 109  BILITOT 0.4 0.8 0.6    GFRNONAA 43* 38* 31*  GFRAA 50* 44* 36*  ANIONGAP 8 10 10      High Sensitivity Troponin:   Recent Labs  Lab 09/05/19 1336 09/05/19 1614 09/07/19 0454 09/07/19 1645  TROPONINIHS 9 10 9 9       BNP Recent Labs  Lab 09/05/19 1336 09/05/19 2028 09/07/19 0754  BNP 261.4* 204.6* 234.1*     Radiology    DG Chest 1 View  Result Date: 09/07/2019 CLINICAL DATA:  Status post left thoracentesis EXAM: CHEST  1 VIEW COMPARISON:  09/05/2019 FINDINGS: Cardiac shadow remains enlarged. Recent left thoracentesis has been performed with significant reduction in left pleural effusion. No pneumothorax is noted. The overall inspiratory effort is poor. Mild basilar atelectasis on the left is noted. Postsurgical changes near the gastroesophageal junction are seen. IMPRESSION: No pneumothorax following left thoracentesis. Electronically Signed   By: Inez Catalina M.D.   On: 09/07/2019 12:32   CT CHEST WO CONTRAST  Result Date: 09/06/2019 CLINICAL DATA:  Abnormal chest radiograph, pleural effusion history of bullous pemphigoid EXAM: CT CHEST WITHOUT CONTRAST TECHNIQUE: Multidetector CT imaging of the chest was performed following the standard protocol without IV contrast. COMPARISON:  Same-day radiograph, CT 05/28/2016 FINDINGS: Cardiovascular: There is cardiomegaly with four-chamber enlargement of the heart. Dense mitral annular calcifications are noted. Extensive calcification of the coronary arteries is seen. Calcifications are present on the aortic valve leaflets. Atherosclerotic plaque within the normal caliber aorta. Normal 3 vessel branching of the aortic arch. Proximal great vessels are heavily calcified. Central pulmonary arteries are normal caliber. Luminal evaluation of the vasculature precluded in the absence of contrast Mediastinum/Nodes: Thyroid gland and thoracic inlet are unremarkable. No acute abnormality of the trachea or esophagus. Scattered low-attenuation mediastinal nodes, likely  reactive edematous. No pathologically enlarged mediastinal or axillary adenopathy. Hilar adenopathy difficult to assess in the absence of contrast media. Lungs/Pleura: Moderate to large left pleural effusion with associated passive atelectasis with essentially complete collapse of the left lower lobe and segmental collapse of the lingula and posterior segment left upper lobe. Shall trace right pleural effusion is present with more bandlike areas of subsegmental atelectatic change. There are interspersed areas of ground-glass opacity with septal and fissural thickening in the aerated portions of the lungs compatible with pulmonary edema. Peribronchovascular cuffing is  noted. Upper Abdomen: Numerous surgical clips throughout the upper abdomen results in extensive streak artifact which limits evaluation of the adjacent soft tissues. Vascular calcium noted throughout the upper abdomen. Portion of the hepatic flexure is interposed anterior to the left lobe liver. No acute abnormalities present in the visualized portions of the upper abdomen. Musculoskeletal: Anterior wedging at T11 is increased from comparison study 05/28/2016 but has an overall subacute to chronic appearance. Correlate for point tenderness. Multilevel degenerative changes are present in the imaged portions of the spine. Moderate bilateral gynecomastia. IMPRESSION: 1. Moderate to large left pleural effusion with associated passive atelectasis with essentially complete collapse of the left lower lobe and segmental collapse of the lingula and posterior segment left upper lobe. 2. Small right pleural effusion with more bandlike areas of adjacent subsegmental atelectatic change. 3. Ground-glass and septal thickening with vascular cuffing in the lungs compatible with pulmonary edema. 4. Moderate pericardial effusion. Cardiomegaly with four-chamber enlargement of the heart. Dense mitral annular calcifications are noted. 5. Constellation of features are most  suggestive of CHF/volume overload. 6. Anterior wedging at T11 is increased from comparison study 05/28/2016 but has an overall subacute to chronic appearance. Correlate for point tenderness. 7. Portion of the hepatic flexure is interposed anterior to the left lobe liver. 8.  Aortic Atherosclerosis (ICD10-I70.0). Electronically Signed   By: Lovena Le M.D.   On: 09/06/2019 00:06   DG Chest Portable 1 View  Result Date: 09/05/2019 CLINICAL DATA:  Shortness of breath.  Hypoxia.  COVID-19. EXAM: PORTABLE CHEST 1 VIEW COMPARISON:  Chest x-ray dated 05/29/2018 FINDINGS: The patient has a new large left pleural effusion. Air bronchograms are visible at the left lung base. There is minimal aeration remaining in the left upper lobe. There is faint haziness in the right upper lobe which may represent an early infiltrate. Heart size and pulmonary vascularity are normal. No acute bone abnormality. IMPRESSION: New large left pleural effusion with atelectasis and consolidation in the left lung as described. Possible faint infiltrate in the right upper lobe. Electronically Signed   By: Lorriane Shire M.D.   On: 09/05/2019 15:25   ECHOCARDIOGRAM COMPLETE  Result Date: 09/06/2019   ECHOCARDIOGRAM REPORT   Patient Name:   [name] Date of Exam: 09/06/2019 Medical Rec #:  [id number]      Height:       74.0 in Accession #:    [id number]     Weight:       346.1 lb Date of Birth:  26-Dec-1945       BSA:          2.74 m Patient Age:    25 years       BP:           89/52 mmHg Patient Gender: M              HR:           59 bpm. Exam Location:  Inpatient Procedure: 2D Echo, Color Doppler, Cardiac Doppler and Intracardiac            Opacification Agent Indications:    CHF 428  History:        Patient has prior history of Echocardiogram examinations, most                 recent 09/09/2017. CHF; Aortic Valve Disease. Covid 19.  Sonographer:    Merrie Roof RDCS Referring Phys: [id number] [place] IMPRESSIONS  1. Left ventricular  ejection fraction, by visual  estimation, is 40 to 45%. The left ventricle has mild to moderately decreased function. There is no left ventricular hypertrophy.  2. Moderate hypokinesis of the left ventricular, entire apical segment.  3. Left ventricular diastolic function could not be evaluated.  4. The left ventricle demonstrates regional wall motion abnormalities.  5. Global right ventricle has normal systolic function.The right ventricular size is normal. No increase in right ventricular wall thickness.  6. Left atrial size was moderately dilated.  7. Right atrial size was normal.  8. Moderate pericardial effusion.  9. The pericardial effusion is circumferential. 10. Moderate mitral annular calcification. 11. The mitral valve is normal in structure. Trivial mitral valve regurgitation. 12. The tricuspid valve is normal in structure. 13. The aortic valve is tricuspid. Aortic valve regurgitation is trivial. 14. The pulmonic valve was grossly normal. Pulmonic valve regurgitation is not visualized. 15. TR signal is inadequate for assessing pulmonary artery systolic pressure. 16. The inferior vena cava is dilated in size with >50% respiratory variability, suggesting right atrial pressure of 8 mmHg. FINDINGS  Left Ventricle: Left ventricular ejection fraction, by visual estimation, is 40 to 45%. The left ventricle has mild to moderately decreased function. Moderate hypokinesis of the left ventricular, entire apical segment. The left ventricle demonstrates regional wall motion abnormalities. The left ventricular internal cavity size was the left ventricle is normal in size. There is no left ventricular hypertrophy. The left ventricular diastology could not be evaluated due to atrial fibrillation. Left ventricular diastolic function could not be evaluated. Right Ventricle: The right ventricular size is normal. No increase in right ventricular wall thickness. Global RV systolic function is has normal systolic function. Left  Atrium: Left atrial size was moderately dilated. Right Atrium: Right atrial size was normal in size Pericardium: A moderately sized pericardial effusion is present. The pericardial effusion is circumferential. There is no evidence of cardiac tamponade. Mitral Valve: The mitral valve is normal in structure. Moderate mitral annular calcification. Trivial mitral valve regurgitation. Tricuspid Valve: The tricuspid valve is normal in structure. Tricuspid valve regurgitation is not demonstrated. Aortic Valve: The aortic valve is tricuspid. . There is moderate thickening and moderate calcification of the aortic valve. Aortic valve regurgitation is trivial. There is moderate thickening of the aortic valve. There is moderate calcification of the aortic valve. Pulmonic Valve: The pulmonic valve was grossly normal. Pulmonic valve regurgitation is not visualized. Pulmonic regurgitation is not visualized. Aorta: The aortic root and ascending aorta are structurally normal, with no evidence of dilitation. Venous: IVC assessment for right atrial pressure unable to be performed due to mechanical ventilation. The inferior vena cava is dilated in size with greater than 50% respiratory variability, suggesting right atrial pressure of 8 mmHg. IAS/Shunts: No atrial level shunt detected by color flow Doppler.  Sanda Klein MD Electronically signed by Sanda Klein MD Signature Date/Time: 09/06/2019/2:35:11 PM    Final    VAS [place] UPPER EXTREMITY VENOUS DUPLEX  Result Date: 09/07/2019 UPPER VENOUS STUDY  Indications: Swelling Limitations: Body habitus and poor ultrasound/tissue interface. Comparison Study: No prior study Performing Technologist: Maudry Mayhew MHA, RDMS, RVT, RDCS  Examination Guidelines: A complete evaluation includes B-mode imaging, spectral Doppler, color Doppler, and power Doppler as needed of all accessible portions of each vessel. Bilateral testing is considered an integral part of a complete examination.  Limited examinations for reoccurring indications may be performed as noted.  Right Findings: +----------+------------+---------+-----------+----------+--------------+  RIGHT      Compressible Phasicity Spontaneous Properties    Summary      +----------+------------+---------+-----------+----------+--------------+  Subclavian                                               Not visualized  +----------+------------+---------+-----------+----------+--------------+  Left Findings: +----------+------------+---------+-----------+----------+--------------+  LEFT       Compressible Phasicity Spontaneous Properties    Summary      +----------+------------+---------+-----------+----------+--------------+  IJV            Full        Yes        Yes                                +----------+------------+---------+-----------+----------+--------------+  Subclavian     Full        Yes        Yes                                +----------+------------+---------+-----------+----------+--------------+  Axillary       Full        Yes        Yes                                +----------+------------+---------+-----------+----------+--------------+  Brachial       Full        Yes        Yes                                +----------+------------+---------+-----------+----------+--------------+  Radial         Full                                                      +----------+------------+---------+-----------+----------+--------------+  Ulnar                                                    Not visualized  +----------+------------+---------+-----------+----------+--------------+  Cephalic                                                 Not visualized  +----------+------------+---------+-----------+----------+--------------+  Summary:  Left: No evidence of deep vein thrombosis in the upper extremity. No evidence of superficial vein thrombosis in the upper extremity. This was a limited exam.  *See table(s) above for measurements and  observations.    Preliminary      Cardiac Studies   ECHO: 09/06/2019 1. Left ventricular ejection fraction, by visual estimation, is 40 to 45%. The left ventricle has mild to moderately decreased function. There is no left ventricular hypertrophy. 2. Moderate hypokinesis of the left ventricular, entire apical segment. 3. Left ventricular diastolic function could not be evaluated. 4. The left ventricle demonstrates regional wall motion abnormalities. 5. Global right ventricle has normal systolic function.The  right ventricular size is normal. No increase in right ventricular wall thickness. 6. Left atrial size was moderately dilated. 7. Right atrial size was normal. 8. Moderate pericardial effusion. 9. The pericardial effusion is circumferential. 10. Moderate mitral annular calcification. 11. The mitral valve is normal in structure. Trivial mitral valve regurgitation. 12. The tricuspid valve is normal in structure. 13. The aortic valve is tricuspid. Aortic valve regurgitation is trivial. 14. The pulmonic valve was grossly normal. Pulmonic valve regurgitation is not visualized. 15. TR signal is inadequate for assessing pulmonary artery systolic pressure. 16. The inferior vena cava is dilated in size with >50% respiratory variability, suggesting right atrial pressure of 8 mmHg.   Patient Profile     73 y.o. male w/ hx mild to moderate CAD w/ 90% RCA rx medically, obesity, HTN, HLD DM with neuropathy and neurogenic bladder, OSA not on CPAP, bullous pemphigoid, syncope, sinus pause 2nd to OSA (no arrhtymias on monitor), EF 35-40% at cath 2016 with subsequent normalization, CKD stage III, right popliteal DVT 08/2017, CVA, was admitted 12/26 with pneumonia and respiratory failure.  He was seen 12/28 for CHF/volume overload and abnormal echo.  Assessment & Plan    1. Acute on chronic resp failure w/ hypoxia - had thoracentesis 12/28 w/ 1300 cc out  - per IM/CCM  2. Acute on  chronic S-D-CHF - EF has been up and down, currently 40-45% by echo, +HK apex - HS trop nl this admit, so may have had OOH MI - need daily wts - I/O not recorded accurately - BUN/Cr increasing, but sig vol overload on exam - based on BMET, could be developing contraction alkalosis, may be intravascularly dry w/ 3rd spacing, albumin 2.2 - Lasix is on hold right now - spoke w/ wound care about ACE wraps or UNNA boots for legs, start w/ ACE wraps and progress as indicated.  3. Peric effusion - likely 2nd CHF - moderate, w/out tamponade - recheck echo next week if still here to see if effusion has improved.  4. Afib - unknown duration, rate is ok.  - his HR is in the 50s regularly, but not sustained in the 40s. 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  Agree as above, with the following exceptions and changes as noted below.  Breathing slightly better, continues to be significantly volume overloaded by exam, had thoracentesis today. Gen: NAD, CV: iRRR, no murmurs, Lungs: clear, Abd: soft, Extrem: Warm, well perfused, 2-3+ edema, Neuro/Psych: alert and  oriented x 3, normal mood and affect. All available labs, radiology testing, previous records reviewed.  Challenging situation given that he may in fact be intravascularly deplete and third spacing with evidence of  pleural effusion, pericardial effusion, and diffuse edema. Recommendations: -Could consider PICC line for measure of CVP to better understand filling pressures given challenging physical exam. -Lasix has been challenging to administer due to renal function and possible intravascular depletion, wrapped legs to mobilize fluid -Continue medical management of CAD -No current indication for pericardiocentesis/no clinical features of tamponade.  Unfortunately if we are unable to successfully diurese the patient, I suspect the pericardial effusion will not change very much in the next week.  However I would recommend repeat imaging in approximately 1 week from the last echo to reevaluate size and hemodynamic impact of effusion  Elouise Munroe 09/08/19 6:53 PM

## 2019-09-08 NOTE — Progress Notes (Signed)
Pt noted to have minimal urine output from foley catheter during shift. 123mL, cloudy, with sediment. Bladder scan attempted, with no success. Chart shows current catheter inserted in November. MD Gherghe made aware and orders received to replace catheter. Attempted to replace 16 straight tip foley with no success. Order placed to insert 18 Coude catheter. Coude inserted by Baxter Flattery D, but with no urine return. Irrigated with 33mL saline, with minimal return. Will monitor pt for output.

## 2019-09-08 NOTE — Care Management Important Message (Signed)
Important Message  Patient Details IM Letter given to [facility] Case Manager to present to the Patient Name: [name] MRN: [id number] Date of Birth: Nov 11, 1945   Medicare Important Message Given:  Yes     Kerin Salen 09/08/2019, 11:21 AM

## 2019-09-08 NOTE — Progress Notes (Signed)
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  Per notes he apparently required 8 L to maintain sats above 90%.  CT scan on admission showed bilateral pleural effusions left greater than right, pulmonary edema, cardiomegaly with pericardial effusion.  He was Covid negative.  He was placed on antibiotics and was admitted to the hospital  Subjective / 24h Interval events: Has no specific complaints but does mention that he does not know why he is here.  No chest pain, no shortness of breath  Assessment & Plan: Principal Problem Acute on chronic hypoxic respiratory failure due to acute pulmonary edema in the setting of acute combined systolic and diastolic CHF -Patient with clinical evidence of significant fluid overload with 2+ lower extremity edema, pulmonary edema on imaging and crackles on exam -Poor historian so difficult to determine length of symptoms -Initially placed on Lasix but creatinine increasing today and hold -2D echo done 12/27 showed EF 40-45% with left apical hypokinesis -Strict ins and outs, daily weights -Has a significant degree of third spacing  Active Problems Pneumonia  -Initially concern for Covid pneumonia given reports that he tested positive for Covid 3 weeks ago, however his Covid PCR came back negative.  He was placed on vancomycin and cefepime, however he is afebrile and without leukocytosis.  Will discontinue broad-spectrum antibiotics and because infectious process cannot be entirely excluded will place on doxycycline for 2 more days to  complete a 5-day course  Large left-sided pleural effusion -Thoracentesis with labs, appreciate PCCM input  OSA -Continue nightly CPAP  Acute metabolic encephalopathy -Patient was altered at presentation likely because of hypercapnia. -More alert and appropriate this morning however still some confusion persists, I wonder what his baseline is  Acute kidney injury -His creatinine currently is 1.7, previous values normal in 2019.  His creatinine seems to be climbing a little bit with Lasix, hold diuresis today  Hyponatremia -Multifactorial but also worsened with Lasix and he is hypochloremic possibly suggesting a degree of intravascular depletion  Hyperkalemia -Repeat Lokelma today, potassium slightly worse.  Will repeat again in the afternoon  Type 2 diabetes mellitus with peripheral neuropathy, with hyperglycemia -A1c 7.7.  Continue insulin  CBG (last 3)  Recent Labs    09/07/19 2350 09/08/19 0434 09/08/19 0744  GLUCAP 191* 167* 137*   History of pulmonary embolism -On Xarelto at home, continue  H/o bullous pemphigoid -Home meds include prednisone 10 mg daily, methotrexate.   -Was placed briefly on IV Decadron given concern for Covid, discontinue that and placed back on home prednisone  Neurogenic bladder Indwelling Foley catheter History of ESBL -No urinary symptoms in this admission.  Urine cultures without significant growth  Morbid obesity  -Patient will benefit from weight loss  Chronic ambulatory dysfunction Chronic pain Restless leg syndrome Anxiety/depression -On tramadol, oxycodone as needed. -Continue Lexapro, Requip, trazodone  Hypothyroidism -Continue Synthroid  GERD/chronic anemia -Continue ferrous sulfate, Protonix, -Hemoglobin stable.  Sacraldecubitus ulcer - POA -nursing to do wound care and consider wound care consultation   Pressure Injury 09/06/19 Toe (Comment  which one) Left Stage 2 -  Partial thickness loss of  dermis  presenting as a shallow open injury with a red, pink wound bed without slough. (Active)  09/06/19 1804  Location: Toe (Comment  which one) (2nd toe )  Location Orientation: Left  Staging: Stage 2 -  Partial thickness loss of dermis presenting as a shallow open injury with a red, pink wound bed without slough.  Wound Description (Comments):   Present on Admission: Yes     Pressure Injury 09/06/19 Toe (Comment  which one) Anterior;Right Unstageable - Full thickness tissue loss in which the base of the injury is covered by slough (yellow, tan, gray, green or brown) and/or eschar (tan, brown or black) in the wound bed. (Active)  09/06/19 1806  Location: Toe (Comment  which one) (2nd toe)  Location Orientation: Anterior;Right  Staging: Unstageable - Full thickness tissue loss in which the base of the injury is covered by slough (yellow, tan, gray, green or brown) and/or eschar (tan, brown or black) in the wound bed.  Wound Description (Comments):   Present on Admission: Yes     Pressure Injury 09/06/19 Buttocks Left Deep Tissue Pressure Injury - Purple or maroon localized area of discolored intact skin or blood-filled blister due to damage of underlying soft tissue from pressure and/or shear. deep tissue, surrounded by MASD, on (Active)  09/06/19 1807  Location: Buttocks  Location Orientation: Left  Staging: Deep Tissue Pressure Injury - Purple or maroon localized area of discolored intact skin or blood-filled blister due to damage of underlying soft tissue from pressure and/or shear.  Wound Description (Comments): deep tissue, surrounded by MASD, on right and left buttocks and up back  Present on Admission: Yes     Pressure Injury 09/06/19 Thigh Posterior;Right Deep Tissue Pressure Injury - Purple or maroon localized area of discolored intact skin or blood-filled blister due to damage of underlying soft tissue from pressure and/or shear. (Active)  09/06/19 1817  Location: Thigh  Location  Orientation: Posterior;Right  Staging: Deep Tissue Pressure Injury - Purple or maroon localized area of discolored intact skin or blood-filled blister due to damage of underlying soft tissue from pressure and/or shear.  Wound Description (Comments):   Present on Admission: Yes   Scheduled Meds: . amLODipine  5 mg Oral Daily  . arformoterol  15 mcg Nebulization BID  . vitamin C  500 mg Oral BID  . aspirin EC  81 mg Oral Daily  . budesonide (PULMICORT) nebulizer solution  0.25 mg Nebulization BID  . Chlorhexidine Gluconate Cloth  6 each Topical Daily  . Chlorhexidine Gluconate Cloth  6 each Topical Q0600  . docusate sodium  100 mg Oral BID  . doxycycline  100 mg Oral Q12H  . escitalopram  20 mg Oral Daily  . ezetimibe  10 mg Oral Daily  . ferrous sulfate  325 mg Oral BID WC  . folic acid  1 mg Oral Daily  . gabapentin  300 mg Oral TID  . Gerhardt's butt cream   Topical BID  . hydrocerin   Topical BID  . insulin aspart  0-5 Units Subcutaneous QHS  . insulin aspart  0-9 Units Subcutaneous TID WC  . insulin glargine  10 Units Subcutaneous Daily  . levothyroxine  25 mcg Oral QAC breakfast  . magnesium oxide  400 mg Oral Daily  . metoprolol succinate  25 mg Oral Daily  . multivitamin with minerals  1 tablet Oral Daily  . mupirocin ointment  1 application Nasal BID  . mupirocin ointment   Topical Daily  .  pantoprazole  40 mg Oral Daily  . predniSONE  10 mg Oral Q breakfast  . rivaroxaban  20 mg Oral Q supper  . rOPINIRole  0.5 mg Oral QHS  . rosuvastatin  5 mg Oral Daily  . sodium zirconium cyclosilicate  10 g Oral Once  . zinc sulfate  220 mg Oral Daily   Continuous Infusions:  PRN Meds:.guaiFENesin, ipratropium-albuterol, nitroGLYCERIN, oxyCODONE, traZODone  DVT prophylaxis: Xarelto Code Status: DNR Family Communication: no family at bedside  Disposition Plan: home when ready   Consultants:  Cardiology   Procedures:  2D echo:  IMPRESSIONS    1. Left ventricular  ejection fraction, by visual estimation, is 40 to 45%. The left ventricle has mild to moderately decreased function. There is no left ventricular hypertrophy.  2. Moderate hypokinesis of the left ventricular, entire apical segment.  3. Left ventricular diastolic function could not be evaluated.  4. The left ventricle demonstrates regional wall motion abnormalities.  5. Global right ventricle has normal systolic function.The right ventricular size is normal. No increase in right ventricular wall thickness.  6. Left atrial size was moderately dilated.  7. Right atrial size was normal.  8. Moderate pericardial effusion.  9. The pericardial effusion is circumferential. 10. Moderate mitral annular calcification. 11. The mitral valve is normal in structure. Trivial mitral valve regurgitation. 12. The tricuspid valve is normal in structure. 13. The aortic valve is tricuspid. Aortic valve regurgitation is trivial. 14. The pulmonic valve was grossly normal. Pulmonic valve regurgitation is not visualized. 15. TR signal is inadequate for assessing pulmonary artery systolic pressure. 16. The inferior vena cava is dilated in size with >50% respiratory variability, suggesting right atrial pressure of 8 mmHg.  Microbiology  none  Antimicrobials: Vancomycin 12/26 -- 12/28 Cefepime 12/26 --12/28 Doxycycline 12/28   Objective: Vitals:   09/07/19 2000 09/07/19 2010 09/07/19 2137 09/08/19 0432  BP:  (!) 128/58 95/75 (!) 123/109  Pulse:  (!) 51 (!) 51 98  Resp:  18 19 16   Temp: (!) 97.5 F (36.4 C)  98.6 F (37 C) 98.9 F (37.2 C)  TempSrc: Oral     SpO2:  100% 100% 97%  Weight:      Height:        Intake/Output Summary (Last 24 hours) at 09/08/2019 1103 Last data filed at 09/08/2019 0500 Gross per 24 hour  Intake 516.11 ml  Output 200 ml  Net 316.11 ml   Filed Weights   09/05/19 1317 09/07/19 0400  Weight: (!) 157 kg (!) 168 kg    Examination:  Constitutional: Eating breakfast, no  apparent distress Eyes: No scleral icterus ENMT: Moist mucous membranes Neck: normal, supple Respiratory: Morbid obesity makes exam difficult but no wheezing or crackles heard overall diminished breath sounds Cardiovascular: Irregular, no murmurs appreciated.  2+ pitting edema, anasarca Abdomen: Soft, nondistended, bowel sounds positive Musculoskeletal: no clubbing / cyanosis.  Neurologic: Nonfocal, equal strength alert to place, time but not situation   Data Reviewed: I have independently reviewed following labs and imaging studies   CBC: Recent Labs  Lab 09/05/19 1336 09/06/19 0500 09/07/19 0458 09/08/19 0527  WBC 9.7 9.0 9.9 11.0*  NEUTROABS  --  8.2* 9.1* 9.7*  HGB 10.4* 9.8* 10.5* 10.1*  HCT 34.8* 33.7* 34.8* 34.0*  MCV 91.8 92.6 90.2 89.2  PLT 329 303 342 161   Basic Metabolic Panel: Recent Labs  Lab 09/05/19 1336 09/05/19 2028 09/06/19 0500 09/07/19 0458 09/08/19 0527  NA 126* 125* 125* 128* 124*  K  5.4* 5.4* 5.5* 5.6* 5.7*  CL 86* 87* 88* 91* 89*  CO2 30 31 29 27 25   GLUCOSE 250* 235* 162* 162* 171*  BUN 40* 39* 40* 47* 51*  CREATININE 1.54* 1.38* 1.58* 1.77* 2.06*  CALCIUM 8.6* 8.4* 8.5* 8.6* 8.1*  MG  --   --  2.2  --   --   PHOS  --   --  3.4  --   --    Liver Function Tests: Recent Labs  Lab 09/05/19 2028 09/06/19 0500 09/07/19 0458 09/08/19 0527  AST 10* 11* 12* 15  ALT 10 11 11 13   ALKPHOS 103 106 114 109  BILITOT 0.7 0.4 0.8 0.6  PROT 5.8* 5.5* 5.7* 5.4*  ALBUMIN 2.3* 2.0* 2.4* 2.2*   Coagulation Profile: Recent Labs  Lab 09/05/19 2202  INR 1.4*   HbA1C: Recent Labs    09/05/19 1904  HGBA1C 7.7*   CBG: Recent Labs  Lab 09/07/19 1601 09/07/19 1929 09/07/19 2350 09/08/19 0434 09/08/19 0744  GLUCAP 171* 173* 191* 167* 137*    Recent Results (from the past 240 hour(s))  Culture, blood (routine x 2)     Status: None (Preliminary result)   Collection Time: 09/05/19  8:29 PM   Specimen: BLOOD  Result Value Ref Range Status    Specimen Description   Final    BLOOD LEFT HAND Performed at [facility] [address] [place], [place] [address]    Special Requests   Final    BOTTLES DRAWN AEROBIC AND ANAEROBIC Blood Culture adequate volume Performed at [facility] [address] [place], [place] [address]    Culture   Final    NO GROWTH 1 DAY Performed at [facility], [place] [address] [place], [place] [address]    Report Status PENDING  Incomplete  Urine culture     Status: Abnormal   Collection Time: 09/05/19  8:29 PM   Specimen: Urine, Random  Result Value Ref Range Status   Specimen Description   Final    URINE, RANDOM Performed at [facility] [address], [place], [place] [address]    Special Requests   Final    NONE Performed at [facility], [place] [address], [place], [place] [address]    Culture MULTIPLE SPECIES PRESENT, SUGGEST RECOLLECTION (A)  Final   Report Status 09/07/2019 FINAL  Final  Culture, blood (routine x 2)     Status: None (Preliminary result)   Collection Time: 09/05/19  8:34 PM   Specimen: BLOOD  Result Value Ref Range Status   Specimen Description   Final    BLOOD RIGHT HAND Performed at [facility] [address], [place], [place] [address]    Special Requests   Final    BOTTLES DRAWN AEROBIC AND ANAEROBIC Blood Culture adequate volume Performed at [facility] [address] [place], [place] [address]    Culture   Final    NO GROWTH 1 DAY Performed at [facility], [place] [address], [place], [place] [address]    Report Status PENDING  Incomplete  Respiratory Panel by RT PCR (Flu A&B, Covid) - Nasopharyngeal Swab     Status: None   Collection Time: 09/06/19  8:10 AM   Specimen: Nasopharyngeal Swab  Result Value Ref Range Status   SARS Coronavirus 2 by RT PCR NEGATIVE NEGATIVE Final    Comment: (NOTE) SARS-CoV-2 target nucleic acids  are  NOT DETECTED. The SARS-CoV-2 RNA is generally detectable in upper respiratoy specimens during the acute phase of infection. The lowest concentration of SARS-CoV-2 viral copies this assay can detect is 131 copies/mL. A negative result does not preclude SARS-Cov-2 infection and should not be used as the sole basis for treatment or other patient management decisions. A negative result may occur with  improper specimen collection/handling, submission of specimen other than nasopharyngeal swab, presence of viral mutation(s) within the areas targeted by this assay, and inadequate number of viral copies (<131 copies/mL). A negative result must be combined with clinical observations, patient history, and epidemiological information. The expected result is Negative. Fact Sheet for Patients:  PinkCheek.be Fact Sheet for Healthcare Providers:  GravelBags.it This test is not yet ap proved or cleared by the [place] FDA and  has been authorized for detection and/or diagnosis of SARS-CoV-2 by FDA under an Emergency Use Authorization (EUA). This EUA will remain  in effect (meaning this test can be used) for the duration of the COVID-19 declaration under Section 564(b)(1) of the Act, 21 U.S.C. section 360bbb-3(b)(1), unless the authorization is terminated or revoked sooner.    Influenza A by PCR NEGATIVE NEGATIVE Final   Influenza B by PCR NEGATIVE NEGATIVE Final    Comment: (NOTE) The Xpert Xpress SARS-CoV-2/FLU/RSV assay is intended as an aid in  the diagnosis of influenza from Nasopharyngeal swab specimens and  should not be used as a sole basis for treatment. Nasal washings and  aspirates are unacceptable for Xpert Xpress SARS-CoV-2/FLU/RSV  testing. Fact Sheet for Patients: PinkCheek.be Fact Sheet for Healthcare Providers: GravelBags.it This test is not yet approved or  cleared by the [place] FDA and  has been authorized for detection and/or diagnosis of SARS-CoV-2 by  FDA under an Emergency Use Authorization (EUA). This EUA will remain  in effect (meaning this test can be used) for the duration of the  Covid-19 declaration under Section 564(b)(1) of the Act, 21  U.S.C. section 360bbb-3(b)(1), unless the authorization is  terminated or revoked. Performed at [facility], [place] [address] [place], [place] [address]   Body fluid culture (includes gram stain)     Status: None (Preliminary result)   Collection Time: 09/07/19 11:51 AM   Specimen: Pleural Fluid  Result Value Ref Range Status   Specimen Description   Final    PLEURAL Performed at [facility] [address], [place], [place] [address]    Special Requests   Final    PLEURAL Performed at [facility], [place] [address], [place], [place] [address]    Gram Stain NO WBC SEEN NO ORGANISMS SEEN   Final   Culture   Final    NO GROWTH < 24 HOURS Performed at [facility], [place] [address], [place], [place] [address]    Report Status PENDING  Incomplete  MRSA PCR Screening     Status: Abnormal   Collection Time: 09/07/19  2:22 PM   Specimen: Nasopharyngeal  Result Value Ref Range Status   MRSA by PCR POSITIVE (A) NEGATIVE Final    Comment:        The GeneXpert MRSA Assay (FDA approved for NASAL specimens only), is one component of a comprehensive MRSA colonization surveillance program. It is not intended to diagnose MRSA infection nor to guide or monitor treatment for MRSA infections. RESULT CALLED TO, READ BACK BY AND VERIFIED WITHKathyrn Lass [id number] @ [address] [place] Performed at [facility], [place] [address], [place], [place] [address]      Radiology Studies: DG Chest 1 View  Result Date: 09/07/2019 CLINICAL DATA:  Status post left thoracentesis EXAM: CHEST  1 VIEW COMPARISON:   09/05/2019 FINDINGS: Cardiac shadow remains enlarged. Recent left thoracentesis has been performed with significant reduction in left pleural effusion. No pneumothorax is noted. The overall inspiratory effort is poor. Mild basilar atelectasis on the left is noted. Postsurgical changes near the gastroesophageal junction are seen. IMPRESSION: No pneumothorax following left thoracentesis. Electronically Signed   By: Inez Catalina M.D.   On: 09/07/2019 12:32   VAS [place] UPPER EXTREMITY VENOUS DUPLEX  Result Date: 09/07/2019 UPPER VENOUS STUDY  Indications: Swelling Limitations: Body habitus and poor ultrasound/tissue interface. Comparison Study: No prior study Performing Technologist: Maudry Mayhew MHA, RDMS, RVT, RDCS  Examination Guidelines: A complete evaluation includes B-mode imaging, spectral Doppler, color Doppler, and power Doppler as needed of all accessible portions of each vessel. Bilateral testing is considered an integral part of a complete examination. Limited examinations for reoccurring indications may be performed as noted.  Right Findings: +----------+------------+---------+-----------+----------+--------------+ RIGHT     CompressiblePhasicitySpontaneousProperties   Summary     +----------+------------+---------+-----------+----------+--------------+ Subclavian                                          Not visualized +----------+------------+---------+-----------+----------+--------------+  Left Findings: +----------+------------+---------+-----------+----------+--------------+ LEFT      CompressiblePhasicitySpontaneousProperties   Summary     +----------+------------+---------+-----------+----------+--------------+ IJV           Full       Yes       Yes                             +----------+------------+---------+-----------+----------+--------------+ Subclavian    Full       Yes       Yes                              +----------+------------+---------+-----------+----------+--------------+ Axillary      Full       Yes       Yes                             +----------+------------+---------+-----------+----------+--------------+ Brachial      Full       Yes       Yes                             +----------+------------+---------+-----------+----------+--------------+ Radial        Full                                                 +----------+------------+---------+-----------+----------+--------------+ Ulnar                                               Not visualized +----------+------------+---------+-----------+----------+--------------+ Cephalic  Not visualized +----------+------------+---------+-----------+----------+--------------+  Summary:  Left: No evidence of deep vein thrombosis in the upper extremity. No evidence of superficial vein thrombosis in the upper extremity. This was a limited exam.  *See table(s) above for measurements and observations.    Preliminary    Marzetta Board, MD, PhD [facility]  Between 7 am - 7 pm I am available, please contact me via Amion or Securechat  Between 7 pm - 7 am I am not available, please contact night coverage MD/APP via Amion

## 2019-09-08 NOTE — Progress Notes (Signed)
ANTICOAGULATION CONSULT NOTE - Initial Consult  Pharmacy Consult for xarelto Indication: atrial fibrillation  Allergies  Allergen Reactions  . Ace Inhibitors Swelling    Pt tolerates lisinopril  . Lipitor [Atorvastatin Calcium] Swelling  . Metformin And Related Swelling  . Atorvastatin Calcium   . Morphine And Related Other (See Comments)    Sweating, feels like is "in rocky boat."  . Robaxin [Methocarbamol] Other (See Comments)    Feels like he is shaky    Patient Measurements: Height: 6\' 2"  (188 cm) Weight: (!) 370 lb 6 oz (168 kg) IBW/kg (Calculated) : 82.2 Heparin Dosing Weight:   Vital Signs: Temp: 98.9 F (37.2 C) (12/29 0432) BP: 123/109 (12/29 0432) Pulse Rate: 98 (12/29 0432)  Labs: Recent Labs    09/05/19 1336 09/05/19 1614 09/05/19 2202 09/05/19 2203 09/06/19 0500 09/06/19 0800 09/07/19 0454 09/07/19 0458 09/07/19 1645 09/08/19 0527  HGB  --   --   --   --  9.8*  --   --  10.5*  --  10.1*  HCT  --   --   --   --  33.7*  --   --  34.8*  --  34.0*  PLT  --   --   --   --  303  --   --  342  --  289  APTT  --   --  39*  --   --  >200*  --   --   --   --   LABPROT  --   --  16.8*  --   --   --   --   --   --   --   INR  --   --  1.4*  --   --   --   --   --   --   --   HEPARINUNFRC  --   --   --  0.69  --  1.06*  --   --   --   --   CREATININE   < >  --   --   --  1.58*  --   --  1.77*  --  2.06*  TROPONINIHS  --  10  --   --   --   --  9  --  9  --    < > = values in this interval not displayed.    Estimated Creatinine Clearance: 54.2 mL/min (A) (by C-G formula based on SCr of 2.06 mg/dL (H)).   Medical History: Past Medical History:  Diagnosis Date  . Arthritis    "hands, ankles, knees" (11/10/2014)  . Bladder spasms   . CAD (coronary artery disease)   . CAD in native artery 09/15/2015  . CHF (congestive heart failure) ([place])   . Chronic indwelling Foley catheter   . Chronic lower back pain   . Depression   . Diabetic diarrhea ([place])   .  Diabetic neuropathy, painful ([place])   . DVT (deep venous thrombosis) ([facility]) 1980's   LLE  . GERD (gastroesophageal reflux disease)   . Hyperlipidemia   . Hypertension   . Neurogenic bladder   . Obese   . OSA (obstructive sleep apnea)    "they wanted me to wear a mask; I couldn't" (11/10/2014)  . Pneumonia 1999  . PONV (postoperative nausea and vomiting)   . Stroke [facility]) 10/2011; 06/2014   right hand numbness/notes 10/20/2011, pt not sure he had stroke in 2013; "right hand weaker and right face  not quite right since" (11/10/2014)  . Type II diabetes mellitus ([place])   . Uncontrolled type II diabetes with peripheral autonomic neuropathy ([place]) 05/09/2015    Medications:  Medications Prior to Admission  Medication Sig Dispense Refill Last Dose  . acetaminophen (TYLENOL) 500 MG tablet Take 1,000 mg by mouth every 8 (eight) hours as needed.    09/04/2019 at Unknown time  . aluminum-magnesium hydroxide-simethicone (MAALOX) [phone] MG/5ML SUSP Take 30 mLs by mouth every 6 (six) hours as needed (indigestion).    Past Month at Unknown time  . amLODipine (NORVASC) 5 MG tablet Take 5 mg by mouth daily.   09/04/2019 at Unknown time  . aspirin EC 81 MG tablet Take 81 mg by mouth daily.   09/04/2019 at Unknown time  . b complex vitamins tablet Take 1 tablet by mouth daily.   09/04/2019 at Unknown time  . bisacodyl (DULCOLAX) 10 MG suppository Place 10 mg rectally daily as needed for moderate constipation.   Past Month at Unknown time  . cholecalciferol (VITAMIN D) 25 MCG (1000 UT) tablet Take 2,000 Units by mouth daily. For 14 days starting 12.15.2020   09/04/2019 at Unknown time  . Cholecalciferol 1000 units tablet Take 50,000 Units by mouth every 14 (fourteen) days.    09/04/2019 at Unknown time  . diclofenac sodium (VOLTAREN) 1 % GEL Apply topically 3 (three) times daily. Apply to left shoulder topically three times daily    Past Month at Unknown time  . docusate sodium (COLACE) 100 MG capsule Take 100 mg  by mouth 2 (two) times daily.   09/04/2019 at Unknown time  . escitalopram (LEXAPRO) 20 MG tablet Take 20 mg by mouth daily.    09/04/2019 at Unknown time  . ezetimibe (ZETIA) 10 MG tablet Take 10 mg by mouth daily.   09/04/2019 at Unknown time  . ferrous sulfate 325 (65 FE) MG tablet Take 325 mg by mouth 2 (two) times daily with a meal.   09/04/2019 at Unknown time  . folic acid (FOLVITE) 1 MG tablet Take 1 mg by mouth daily.   09/04/2019 at Unknown time  . gabapentin (NEURONTIN) 300 MG capsule Take 300 mg by mouth 3 (three) times daily.    09/04/2019 at Unknown time  . guaiFENesin (MUCINEX) 600 MG 12 hr tablet Take 600 mg by mouth 2 (two) times daily as needed for cough.    Past Week at Unknown time  . insulin lispro (HUMALOG) 100 UNIT/ML injection Inject 2-12 Units into the skin See admin instructions. CBG 151-200 =2UNITS CBG 201-250 =4UNITS CBG 251-300 =6UNITS CBG 301-350 =8UNITS CBG 351-400= 10 UNITS CBG 401+ =12UNITS CALL MD IF CBG ABOVE 450   09/05/2019 at Unknown time  . ipratropium-albuterol (DUONEB) 0.5-2.5 (3) MG/3ML SOLN Take 3 mLs by nebulization every 6 (six) hours as needed (sob).    Past Week at Unknown time  . levothyroxine (SYNTHROID) 25 MCG tablet Take 25 mcg by mouth daily before breakfast.   09/05/2019 at Unknown time  . magnesium oxide (MAG-OX) 400 MG tablet Take 400 mg by mouth daily.   09/04/2019 at Unknown time  . metoprolol succinate (TOPROL-XL) 25 MG 24 hr tablet Take 25 mg by mouth daily.   09/04/2019 at 0900  . Multiple Vitamin (MULTIVITAMIN) tablet Take 1 tablet by mouth daily.   09/04/2019 at Unknown time  . nitroGLYCERIN (NITROSTAT) 0.4 MG SL tablet Place 0.4 mg under the tongue every 5 (five) minutes as needed for chest pain.   unk at [employer]  .  oxyCODONE (OXY IR/ROXICODONE) 5 MG immediate release tablet Take 5 mg by mouth every 8 (eight) hours as needed for severe pain.   08/21/2019  . pantoprazole (PROTONIX) 40 MG tablet Take 40 mg by mouth daily.   09/04/2019 at  Unknown time  . polyethylene glycol (MIRALAX / GLYCOLAX) packet Take 17 g by mouth daily as needed for mild constipation or moderate constipation.   09/04/2019 at Unknown time  . predniSONE (DELTASONE) 10 MG tablet Take 10 mg by mouth daily.   09/04/2019 at Unknown time  . rivaroxaban (XARELTO) 10 MG TABS tablet Take 10 mg by mouth daily.   09/04/2019 at 0900  . rOPINIRole (REQUIP) 0.5 MG tablet Take 0.5 mg by mouth at bedtime. 2100   09/04/2019 at Unknown time  . rosuvastatin (CRESTOR) 5 MG tablet Take 5 mg by mouth daily.   09/04/2019 at Unknown time  . simethicone (MYLICON) 297 MG chewable tablet Chew 125 mg by mouth every 6 (six) hours as needed for flatulence.   Past Month at Unknown time  . traZODone (DESYREL) 100 MG tablet Take 150 mg by mouth at bedtime.    09/04/2019 at Unknown time  . vitamin C (ASCORBIC ACID) 500 MG tablet Take 500 mg by mouth 2 (two) times daily.   09/04/2019 at Unknown time  . zinc gluconate 50 MG tablet Take 100 mg by mouth daily. For 14 days starting 12.15.2020   09/04/2019 at Unknown time  . NON FORMULARY Low concentrated sweets diet - Regular texture, NAS (no added salt)     . OXYGEN Inhale into the lungs. O2 at 2l/min via nasal canula as needed for shortness of breath       Assessment: 73 yo M on long term Xarelto 10 mg qday PTA for hx PE/DVT.  Pharmacy consulted to increase xarelto to therapeutic dose for Afib.  SCr up to 2.06. TBW CrCl 78 mL/min. Hg low but stable at 10.1, PLTC WNL, no bleeding reported.     Plan:  Xarelto 20 qsupper  Eudelia Bunch, Pharm.D [phone] 09/08/2019 9:46 AM

## 2019-09-09 ENCOUNTER — Inpatient Hospital Stay (HOSPITAL_COMMUNITY): Payer: Medicare Other

## 2019-09-09 LAB — COMPREHENSIVE METABOLIC PANEL
ALT: 12 U/L (ref 0–44)
AST: 21 U/L (ref 15–41)
Albumin: 2.3 g/dL — ABNORMAL LOW (ref 3.5–5.0)
Alkaline Phosphatase: 107 U/L (ref 38–126)
Anion gap: 10 (ref 5–15)
BUN: 62 mg/dL — ABNORMAL HIGH (ref 8–23)
CO2: 25 mmol/L (ref 22–32)
Calcium: 7.9 mg/dL — ABNORMAL LOW (ref 8.9–10.3)
Chloride: 89 mmol/L — ABNORMAL LOW (ref 98–111)
Creatinine, Ser: 2.39 mg/dL — ABNORMAL HIGH (ref 0.61–1.24)
GFR calc Af Amer: 30 mL/min — ABNORMAL LOW (ref >60)
GFR calc non Af Amer: 26 mL/min — ABNORMAL LOW (ref >60)
Glucose, Bld: 200 mg/dL — ABNORMAL HIGH (ref 70–99)
Potassium: 5.5 mmol/L — ABNORMAL HIGH (ref 3.5–5.1)
Sodium: 124 mmol/L — ABNORMAL LOW (ref 135–145)
Total Bilirubin: 0.5 mg/dL (ref 0.3–1.2)
Total Protein: 5.5 g/dL — ABNORMAL LOW (ref 6.5–8.1)

## 2019-09-09 LAB — CBC WITH DIFFERENTIAL/PLATELET
Abs Immature Granulocytes: 0.27 10*3/uL — ABNORMAL HIGH (ref 0.00–0.07)
Basophils Absolute: 0 10*3/uL (ref 0.0–0.1)
Basophils Relative: 0 %
Eosinophils Absolute: 0.1 10*3/uL (ref 0.0–0.5)
Eosinophils Relative: 1 %
HCT: 32.7 % — ABNORMAL LOW (ref 39.0–52.0)
Hemoglobin: 10 g/dL — ABNORMAL LOW (ref 13.0–17.0)
Immature Granulocytes: 3 %
Lymphocytes Relative: 8 %
Lymphs Abs: 0.9 10*3/uL (ref 0.7–4.0)
MCH: 27.5 pg (ref 26.0–34.0)
MCHC: 30.6 g/dL (ref 30.0–36.0)
MCV: 89.8 fL (ref 80.0–100.0)
Monocytes Absolute: 0.8 10*3/uL (ref 0.1–1.0)
Monocytes Relative: 8 %
Neutro Abs: 8.7 10*3/uL — ABNORMAL HIGH (ref 1.7–7.7)
Neutrophils Relative %: 80 %
Platelets: 241 10*3/uL (ref 150–400)
RBC: 3.64 MIL/uL — ABNORMAL LOW (ref 4.22–5.81)
RDW: 17 % — ABNORMAL HIGH (ref 11.5–15.5)
WBC: 10.8 10*3/uL — ABNORMAL HIGH (ref 4.0–10.5)
nRBC: 0.6 % — ABNORMAL HIGH (ref 0.0–0.2)

## 2019-09-09 LAB — GLUCOSE, CAPILLARY
Glucose-Capillary: 178 mg/dL — ABNORMAL HIGH (ref 70–99)
Glucose-Capillary: 233 mg/dL — ABNORMAL HIGH (ref 70–99)
Glucose-Capillary: 175 mg/dL — ABNORMAL HIGH (ref 70–99)
Glucose-Capillary: 202 mg/dL — ABNORMAL HIGH (ref 70–99)
Glucose-Capillary: 210 mg/dL — ABNORMAL HIGH (ref 70–99)

## 2019-09-09 LAB — LACTIC ACID, PLASMA: Lactic Acid, Venous: 1.3 mmol/L (ref 0.5–1.9)

## 2019-09-09 LAB — MAGNESIUM: Magnesium: 2.5 mg/dL — ABNORMAL HIGH (ref 1.7–2.4)

## 2019-09-09 MED ORDER — SODIUM ZIRCONIUM CYCLOSILICATE 10 G PO PACK
10.0000 g | PACK | Freq: Once | ORAL | Status: AC
  Administered 2019-09-09: 10 g via ORAL
  Filled 2019-09-09: qty 1

## 2019-09-09 MED ORDER — ALBUMIN HUMAN 25 % IV SOLN
25.0000 g | Freq: Once | INTRAVENOUS | Status: AC
  Administered 2019-09-09: 12.5 g via INTRAVENOUS
  Filled 2019-09-09: qty 100

## 2019-09-09 NOTE — Progress Notes (Signed)
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  Per notes he apparently required 8 L to maintain sats above 90%.  CT scan on admission showed bilateral pleural effusions left greater than right, pulmonary edema, cardiomegaly with pericardial effusion.  He was Covid negative.  He was placed on antibiotics and was admitted to the hospital.  Critical care was consulted.  2D echo done shortly after admission showed newly depressed EF of about 40% with left apical wall motion abnormality, and cardiology was consulted.  He was given diuresis for couple days however his creatinine continued to get worse, Lasix was stopped but despite that his kidney failure persisted.  Nephrology consulted on 12/20.  Of note, palliative also following  Subjective / 24h Interval events: Has intermittent confusion.  Overnight reported chest pain relieved with sublingual nitroglycerin however he does not recall that this morning.  He knows he is at [facility], but does not know the year or why he is here.  Assessment & Plan: Principal Problem Acute on chronic hypoxic respiratory failure due to acute pulmonary edema in the setting of acute combined systolic and diastolic CHF -Patient with clinical evidence of significant fluid overload with 2+ lower extremity edema, pulmonary edema on imaging and crackles on exam during admission -Poor historian so difficult to determine length of symptoms -Initially placed on Lasix but creatinine has been increasing and now Lasix is on hold -2D echo done 12/27  showed EF 40-45% with left apical hypokinesis, cardiology consulted -Has a significant degree of third spacing.  Received albumin 12/29 and lower extremity compression wraps.    Active Problems Acute kidney injury -His prior baseline creatinine was normal in 2019.  He seems to be intravascularly depleted however is third spacing significant amount of extra fluid -Has received Lasix for the first couple of days with resultant increase in creatinine has received albumin yesterday and has had very little urine output overnight and is creatinine continues to climb. -We will repeat albumin dose today and have consulted nephrology  Pneumonia  -Initially concern for Covid pneumonia given reports that he tested positive for Covid 3 weeks ago, however his Covid PCR came back negative.  He was placed on vancomycin and cefepime, however he is afebrile and without leukocytosis.  Will discontinue broad-spectrum antibiotics and because infectious process cannot be entirely excluded will place on doxycycline for 1 more day to complete a 5-day course  Large left-sided pleural effusion -Thoracentesis done by PCCM  OSA -Continue nightly CPAP  Acute metabolic encephalopathy -Patient was altered at presentation likely because of hypercapnia. -Overall he is more alert but still remains intermittently confused  Hyponatremia -Multifactorial but also worsened with Lasix and he is hypochloremic possibly suggesting a degree of intravascular depletion  Hyperkalemia -Hyperkalemia is persistent despite daily Lokelma.  We will continue Lokelma today as well and monitor potassium  Type 2 diabetes mellitus with peripheral neuropathy, with hyperglycemia -A1c 7.7.  Continue insulin  CBG (last 3)  Recent Labs    09/08/19 1700 09/08/19 2134 09/09/19 0733  GLUCAP 191* 225* 178*   History of pulmonary embolism -On Xarelto at  home, continue  H/o bullous pemphigoid -Home meds include prednisone 10 mg daily,  methotrexate.   -Was placed briefly on IV Decadron given concern for Covid, discontinue that and placed back on home prednisone  Neurogenic bladder Indwelling Foley catheter History of ESBL -No urinary symptoms in this admission.  Urine cultures without significant growth  Morbid obesity  -Patient will benefit from weight loss  Chronic ambulatory dysfunction Chronic pain Restless leg syndrome Anxiety/depression -On tramadol, oxycodone as needed. -Continue Lexapro, Requip, discontinue trazodone  Hypothyroidism -Continue Synthroid was normal on 12/28 at 3.4  GERD/chronic anemia -Continue ferrous sulfate, Protonix, -Hemoglobin stable.  Sacraldecubitus ulcer - POA -nursing to do wound care and consider wound care consultation   Pressure Injury 09/06/19 Toe (Comment  which one) Left Stage 2 -  Partial thickness loss of dermis presenting as a shallow open injury with a red, pink wound bed without slough. (Active)  09/06/19 1804  Location: Toe (Comment  which one) (2nd toe )  Location Orientation: Left  Staging: Stage 2 -  Partial thickness loss of dermis presenting as a shallow open injury with a red, pink wound bed without slough.  Wound Description (Comments):   Present on Admission: Yes     Pressure Injury 09/06/19 Toe (Comment  which one) Anterior;Right Unstageable - Full thickness tissue loss in which the base of the injury is covered by slough (yellow, tan, gray, green or brown) and/or eschar (tan, brown or black) in the wound bed. (Active)  09/06/19 1806  Location: Toe (Comment  which one) (2nd toe)  Location Orientation: Anterior;Right  Staging: Unstageable - Full thickness tissue loss in which the base of the injury is covered by slough (yellow, tan, gray, green or brown) and/or eschar (tan, brown or black) in the wound bed.  Wound Description (Comments):   Present on Admission: Yes     Pressure Injury 09/06/19 Buttocks Left Deep Tissue Pressure Injury - Purple  or maroon localized area of discolored intact skin or blood-filled blister due to damage of underlying soft tissue from pressure and/or shear. deep tissue, surrounded by MASD, on (Active)  09/06/19 1807  Location: Buttocks  Location Orientation: Left  Staging: Deep Tissue Pressure Injury - Purple or maroon localized area of discolored intact skin or blood-filled blister due to damage of underlying soft tissue from pressure and/or shear.  Wound Description (Comments): deep tissue, surrounded by MASD, on right and left buttocks and up back  Present on Admission: Yes     Pressure Injury 09/06/19 Thigh Posterior;Right Deep Tissue Pressure Injury - Purple or maroon localized area of discolored intact skin or blood-filled blister due to damage of underlying soft tissue from pressure and/or shear. (Active)  09/06/19 1817  Location: Thigh  Location Orientation: Posterior;Right  Staging: Deep Tissue Pressure Injury - Purple or maroon localized area of discolored intact skin or blood-filled blister due to damage of underlying soft tissue from pressure and/or shear.  Wound Description (Comments):   Present on Admission: Yes   Scheduled Meds: . amLODipine  5 mg Oral Daily  . arformoterol  15 mcg Nebulization BID  . vitamin C  500 mg Oral BID  . aspirin EC  81 mg Oral Daily  . budesonide (PULMICORT) nebulizer solution  0.25 mg Nebulization BID  . Chlorhexidine Gluconate Cloth  6 each Topical Daily  . Chlorhexidine Gluconate Cloth  6 each Topical Q0600  . docusate sodium  100 mg Oral BID  . doxycycline  100 mg Oral Q12H  . escitalopram  20  mg Oral Daily  . ezetimibe  10 mg Oral Daily  . ferrous sulfate  325 mg Oral BID WC  . folic acid  1 mg Oral Daily  . gabapentin  300 mg Oral TID  . Gerhardt's butt cream   Topical BID  . hydrocerin   Topical BID  . insulin aspart  0-5 Units Subcutaneous QHS  . insulin aspart  0-9 Units Subcutaneous TID WC  . insulin glargine  10 Units Subcutaneous Daily  .  levothyroxine  25 mcg Oral QAC breakfast  . magnesium oxide  400 mg Oral Daily  . multivitamin with minerals  1 tablet Oral Daily  . mupirocin ointment  1 application Nasal BID  . mupirocin ointment   Topical Daily  . pantoprazole  40 mg Oral Daily  . predniSONE  10 mg Oral Q breakfast  . rivaroxaban  20 mg Oral Q supper  . rOPINIRole  0.5 mg Oral QHS  . rosuvastatin  5 mg Oral Daily  . sodium zirconium cyclosilicate  10 g Oral Once   Continuous Infusions:  PRN Meds:.guaiFENesin, ipratropium-albuterol, nitroGLYCERIN, oxyCODONE, traZODone  DVT prophylaxis: Xarelto Code Status: DNR Family Communication: no family at bedside  Disposition Plan: home when ready   Consultants:  Cardiology   Procedures:  2D echo:  IMPRESSIONS    1. Left ventricular ejection fraction, by visual estimation, is 40 to 45%. The left ventricle has mild to moderately decreased function. There is no left ventricular hypertrophy.  2. Moderate hypokinesis of the left ventricular, entire apical segment.  3. Left ventricular diastolic function could not be evaluated.  4. The left ventricle demonstrates regional wall motion abnormalities.  5. Global right ventricle has normal systolic function.The right ventricular size is normal. No increase in right ventricular wall thickness.  6. Left atrial size was moderately dilated.  7. Right atrial size was normal.  8. Moderate pericardial effusion.  9. The pericardial effusion is circumferential. 10. Moderate mitral annular calcification. 11. The mitral valve is normal in structure. Trivial mitral valve regurgitation. 12. The tricuspid valve is normal in structure. 13. The aortic valve is tricuspid. Aortic valve regurgitation is trivial. 14. The pulmonic valve was grossly normal. Pulmonic valve regurgitation is not visualized. 15. TR signal is inadequate for assessing pulmonary artery systolic pressure. 16. The inferior vena cava is dilated in size with >50%  respiratory variability, suggesting right atrial pressure of 8 mmHg.  Microbiology  Pleural fluid 12/28-no growth, pending Blood cultures 12/26-no growth, pending MRSA PCR-positive  Antimicrobials: Vancomycin 12/26 -- 12/28 Cefepime 12/26 --12/28 Doxycycline 12/28 with plans to finish on 12/31   Objective: Vitals:   09/09/19 0532 09/09/19 0533 09/09/19 0730 09/09/19 0800  BP: 110/76  110/72   Pulse: (!) 47 (!) 51 (!) 55   Resp: 16  16   Temp:   97.9 F (36.6 C)   TempSrc:   Oral   SpO2: 100% 100% 100% 100%  Weight:      Height:        Intake/Output Summary (Last 24 hours) at 09/09/2019 1055 Last data filed at 09/09/2019 0600 Gross per 24 hour  Intake 430 ml  Output 280 ml  Net 150 ml   Filed Weights   09/05/19 1317 09/07/19 0400  Weight: (!) 157 kg (!) 168 kg    Examination:  Constitutional: Chronically ill-appearing morbidly obese male, no apparent significant distress Eyes: No scleral icterus seen ENMT: Moist mucous membranes Respiratory: Overall clear without wheezing or crackles but exam difficult due to  morbid obesity Cardiovascular: Irregular, bradycardic, no murmurs appreciated, anasarca, lower extremity Ace wrapped Abdomen: Soft, NT, ND, positive bowel sounds Musculoskeletal: no clubbing / cyanosis.  Neurologic: No focal deficits, overall weak but strength is equal.  Alert to self and place   Data Reviewed: I have independently reviewed following labs and imaging studies   CBC: Recent Labs  Lab 09/05/19 1336 09/06/19 0500 09/07/19 0458 09/08/19 0527 09/09/19 0531  WBC 9.7 9.0 9.9 11.0* 10.8*  NEUTROABS  --  8.2* 9.1* 9.7* 8.7*  HGB 10.4* 9.8* 10.5* 10.1* 10.0*  HCT 34.8* 33.7* 34.8* 34.0* 32.7*  MCV 91.8 92.6 90.2 89.2 89.8  PLT 329 303 342 289 300   Basic Metabolic Panel: Recent Labs  Lab 09/05/19 2028 09/06/19 0500 09/07/19 0458 09/08/19 0527 09/08/19 1627 09/09/19 0531  NA 125* 125* 128* 124*  --  124*  K 5.4* 5.5* 5.6* 5.7*  5.6* 5.5*  CL 87* 88* 91* 89*  --  89*  CO2 31 29 27 25   --  25  GLUCOSE 235* 162* 162* 171*  --  200*  BUN 39* 40* 47* 51*  --  62*  CREATININE 1.38* 1.58* 1.77* 2.06*  --  2.39*  CALCIUM 8.4* 8.5* 8.6* 8.1*  --  7.9*  MG  --  2.2  --   --   --  2.5*  PHOS  --  3.4  --   --   --   --    Liver Function Tests: Recent Labs  Lab 09/05/19 2028 09/06/19 0500 09/07/19 0458 09/08/19 0527 09/09/19 0531  AST 10* 11* 12* 15 21  ALT 10 11 11 13 12   ALKPHOS 103 106 114 109 107  BILITOT 0.7 0.4 0.8 0.6 0.5  PROT 5.8* 5.5* 5.7* 5.4* 5.5*  ALBUMIN 2.3* 2.0* 2.4* 2.2* 2.3*   Coagulation Profile: Recent Labs  Lab 09/05/19 2202  INR 1.4*   HbA1C: No results for input(s): HGBA1C in the last 72 hours. CBG: Recent Labs  Lab 09/08/19 0744 09/08/19 1208 09/08/19 1700 09/08/19 2134 09/09/19 0733  GLUCAP 137* 165* 191* 225* 178*    Recent Results (from the past 240 hour(s))  Culture, blood (routine x 2)     Status: None (Preliminary result)   Collection Time: 09/05/19  8:29 PM   Specimen: BLOOD  Result Value Ref Range Status   Specimen Description   Final    BLOOD LEFT HAND Performed at [facility], [place] [address] [place], [place] [address]    Special Requests   Final    BOTTLES DRAWN AEROBIC AND ANAEROBIC Blood Culture adequate volume Performed at [place] [address], [place], [place] [address]    Culture   Final    NO GROWTH 2 DAYS Performed at [place] [address] [place], [place] [address]    Report Status PENDING  Incomplete  Urine culture     Status: Abnormal   Collection Time: 09/05/19  8:29 PM   Specimen: Urine, Random  Result Value Ref Range Status   Specimen Description   Final    URINE, RANDOM Performed at [place] [address], [place], [place] [address]    Special Requests   Final    NONE Performed at [facility], [place] [address], [place],  [place] [address]    Culture MULTIPLE SPECIES PRESENT, SUGGEST RECOLLECTION (A)  Final   Report Status 09/07/2019 FINAL  Final  Culture, blood (routine x 2)  Status: None (Preliminary result)   Collection Time: 09/05/19  8:34 PM   Specimen: BLOOD  Result Value Ref Range Status   Specimen Description   Final    BLOOD RIGHT HAND Performed at [place] [address], [place], [place] [address]    Special Requests   Final    BOTTLES DRAWN AEROBIC AND ANAEROBIC Blood Culture adequate volume Performed at [place] [address], [place], [place] [address]    Culture   Final    NO GROWTH 2 DAYS Performed at [place] [address] [place], [place] [address]    Report Status PENDING  Incomplete  Respiratory Panel by RT PCR (Flu A&B, Covid) - Nasopharyngeal Swab     Status: None   Collection Time: 09/06/19  8:10 AM   Specimen: Nasopharyngeal Swab  Result Value Ref Range Status   SARS Coronavirus 2 by RT PCR NEGATIVE NEGATIVE Final    Comment: (NOTE) SARS-CoV-2 target nucleic acids are NOT DETECTED. The SARS-CoV-2 RNA is generally detectable in upper respiratoy specimens during the acute phase of infection. The lowest concentration of SARS-CoV-2 viral copies this assay can detect is 131 copies/mL. A negative result does not preclude SARS-Cov-2 infection and should not be used as the sole basis for treatment or other patient management decisions. A negative result may occur with  improper specimen collection/handling, submission of specimen other than nasopharyngeal swab, presence of viral mutation(s) within the areas targeted by this assay, and inadequate number of viral copies (<131 copies/mL). A negative result must be combined with clinical observations, patient history, and epidemiological information. The expected result is Negative. Fact Sheet for Patients:  PinkCheek.be Fact Sheet for Healthcare  Providers:  GravelBags.it This test is not yet ap proved or cleared by the [place] FDA and  has been authorized for detection and/or diagnosis of SARS-CoV-2 by FDA under an Emergency Use Authorization (EUA). This EUA will remain  in effect (meaning this test can be used) for the duration of the COVID-19 declaration under Section 564(b)(1) of the Act, 21 U.S.C. section 360bbb-3(b)(1), unless the authorization is terminated or revoked sooner.    Influenza A by PCR NEGATIVE NEGATIVE Final   Influenza B by PCR NEGATIVE NEGATIVE Final    Comment: (NOTE) The Xpert Xpress SARS-CoV-2/FLU/RSV assay is intended as an aid in  the diagnosis of influenza from Nasopharyngeal swab specimens and  should not be used as a sole basis for treatment. Nasal washings and  aspirates are unacceptable for Xpert Xpress SARS-CoV-2/FLU/RSV  testing. Fact Sheet for Patients: PinkCheek.be Fact Sheet for Healthcare Providers: GravelBags.it This test is not yet approved or cleared by the [place] FDA and  has been authorized for detection and/or diagnosis of SARS-CoV-2 by  FDA under an Emergency Use Authorization (EUA). This EUA will remain  in effect (meaning this test can be used) for the duration of the  Covid-19 declaration under Section 564(b)(1) of the Act, 21  U.S.C. section 360bbb-3(b)(1), unless the authorization is  terminated or revoked. Performed at [facility], [place] [address], [place], [place] [address]   Body fluid culture (includes gram stain)     Status: None (Preliminary result)   Collection Time: 09/07/19 11:51 AM   Specimen: Pleural Fluid  Result Value Ref Range Status   Specimen Description   Final    PLEURAL Performed at [place] [address] [place], [place] [address]    Special Requests  Final    PLEURAL Performed at [facility], [place] [address], [place], [place] [address]    Gram Stain NO WBC SEEN NO ORGANISMS SEEN   Final   Culture   Final    NO GROWTH 2 DAYS Performed at [facility], [place] [address] [place] [address] [place], [place] [address]    Report Status PENDING  Incomplete  MRSA PCR Screening     Status: Abnormal   Collection Time: 09/07/19  2:22 PM   Specimen: Nasopharyngeal  Result Value Ref Range Status   MRSA by PCR POSITIVE (A) NEGATIVE Final    Comment:        The GeneXpert MRSA Assay (FDA approved for NASAL specimens only), is one component of a comprehensive MRSA colonization surveillance program. It is not intended to diagnose MRSA infection nor to guide or monitor treatment for MRSA infections. RESULT CALLED TO, READ BACK BY AND VERIFIED WITHKathyrn Lass [id number] @ [address] Performed at [facility], [place] [address] [place], [place] [address]      Radiology Studies: DG CHEST PORT 1 VIEW  Result Date: 09/09/2019 CLINICAL DATA:  Respiratory failure. EXAM: PORTABLE CHEST 1 VIEW COMPARISON:  September 07, 2019. FINDINGS: Stable cardiomegaly. No pneumothorax is noted. Right lung is clear. Increased left perihilar and basilar opacity is noted concerning for worsening pneumonia or atelectasis. Probable small left pleural effusion is noted. Bony thorax is unremarkable. IMPRESSION: Increased left perihilar and basilar opacity is noted concerning for worsening pneumonia or atelectasis. Probable small left pleural effusion is noted as well. Stable mild cardiomegaly is noted. No pneumothorax is noted. Electronically Signed   By: Marijo Conception M.D.   On: 09/09/2019 07:29   Marzetta Board, MD, PhD [facility]  Between 7 am - 7 pm I am available, please contact me via Amion or Securechat  Between 7 pm - 7 am I am not available, please contact night coverage MD/APP via Amion

## 2019-09-09 NOTE — Progress Notes (Signed)
RN paged NP regarding 5-6/10 chest pain radiating down both arms.  Nitro x 2 with relief.  EKG ordered.

## 2019-09-09 NOTE — Progress Notes (Signed)
Bilateral lower extremity dressings changed. Legs below the knee cleansed and Eucerin creme applied to bilateral legs. Rewrapped with ace wrap and kerlix wrap. Skin to bilateral legs below the knee  very dry and scaly. Pt tolerated dressing changes well.

## 2019-09-09 NOTE — Progress Notes (Signed)
RN notified M. Sharlet Salina, NP that the patient's HR is ranging from 39-53 on the Tele monitor. BP 90/76 RN will obtain a Manual BP. RN will continue to monitor.  Orders Received: Labs - Lactic Acid, NP to notify NP if there are any other changes

## 2019-09-09 NOTE — Consult Note (Signed)
Renal Service Consult Note [facility]  R[place] 09/09/2019 Sol [place] Requesting Physician:  Dr Cruzita Lederer  Reason for Consult:  Renal failure HPI: The patient is a 73 y.o. 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 Xrays showed large L pleural effusion, moderate pericard effusion.  Also was confused / lethargic, low Na 126, creat 1.5 on admission up to 2.6 now . Asked to see for renal failure.   CT chest showed large L effusion , pericard effusion and GG changes c/w edema. rec'd IV lasix 40- 80 mg several doses on 12/28 and 12/29, UOP was marginal w/ 280 cc total out yesterday.   Creat 1.58 >> 2.0 yest and 2.3 today.  K 5.5, Na 125 >> 124 today.   ECHO 12/27 showed new lower LVEF of 40- 45%.    Pt didn't get lasix today.  Has had 15 cc dark brown urine out since shift change this am.    Pt is lethargic but arouseable, falls asleep during conversation. No severe SOB.     ROS  denies CP  no joint pain   no HA  no blurry vision  no rash  no diarrhea  no nausea/ vomiting     Past Medical History  Past Medical History:  Diagnosis Date  . Arthritis    "hands, ankles, knees" (11/10/2014)  . Bladder spasms   . CAD (coronary artery disease)   . CAD in native artery 09/15/2015  . CHF (congestive heart failure) ([place])   . Chronic indwelling Foley catheter   . Chronic lower back pain   . Depression   . Diabetic diarrhea ([place])   . Diabetic neuropathy, painful ([place])   . DVT (deep venous thrombosis) ([facility]) 1980's   LLE  . GERD (gastroesophageal reflux disease)   . Hyperlipidemia   . Hypertension   . Neurogenic bladder   . Obese   . OSA (obstructive sleep apnea)    "they wanted me to wear a mask; I couldn't" (11/10/2014)  . Pneumonia 1999  . PONV (postoperative nausea and  vomiting)   . Stroke [facility]) 10/2011; 06/2014   right hand numbness/notes 10/20/2011, pt not sure he had stroke in 2013; "right hand weaker and right face not quite right since" (11/10/2014)  . Type II diabetes mellitus ([place])   . Uncontrolled type II diabetes with peripheral autonomic neuropathy ([place]) 05/09/2015   Past Surgical History  Past Surgical History:  Procedure Laterality Date  . CARDIAC CATHETERIZATION N/A 05/02/2015   Procedure: Left Heart Cath and Coronary Angiography;  Surgeon: Lorretta Harp, MD;  Location: [place] [facility];  Service: Cardiovascular;  Laterality: N/A;  . CARDIAC CATHETERIZATION N/A 05/04/2015   Procedure: Left Heart Cath and Coronary Angiography;  Surgeon: Wellington Hampshire, MD;  Location: [place] [facility];  Service: Cardiovascular;  Laterality: N/A;  . CARPAL TUNNEL RELEASE Bilateral ~ 2003-2004  . CHOLECYSTECTOMY OPEN  1970's?  Marland Kitchen GASTROPLASTY    . JOINT REPLACEMENT    . LEFT HEART CATHETERIZATION WITH CORONARY ANGIOGRAM N/A 10/24/2011   Procedure: LEFT HEART CATHETERIZATION WITH CORONARY ANGIOGRAM;  Surgeon: Candee Furbish, MD;  Location: [facility] CATH LAB;  Service: Cardiovascular;  Laterality: N/A;  right radial artery approach  . TOTAL KNEE ARTHROPLASTY Right 1990's   Family History  Family History  Problem Relation Age of Onset  .  Diabetes type I Father   . Hypertension Mother   . Cancer Brother         Testicular Cancer  . Cancer Brother        Leukemia   Social History  reports that he quit smoking about 43 years ago. His smoking use included cigarettes. He has a 40.00 pack-year smoking history. He has never used smokeless tobacco. He reports that he does not drink alcohol or use drugs. Allergies  Allergies  Allergen Reactions  . Ace Inhibitors Swelling    Pt tolerates lisinopril  . Lipitor [Atorvastatin Calcium] Swelling  . Metformin And Related Swelling  . Atorvastatin Calcium   . Morphine And Related Other (See Comments)    Sweating, feels like is  "in rocky boat."  . Robaxin [Methocarbamol] Other (See Comments)    Feels like he is shaky   Home medications Prior to Admission medications   Medication Sig Start Date End Date Taking? Authorizing Provider  acetaminophen (TYLENOL) 500 MG tablet Take 1,000 mg by mouth every 8 (eight) hours as needed.  01/10/18  Yes [provider]  aluminum-magnesium hydroxide-simethicone (MAALOX) [phone] MG/5ML SUSP Take 30 mLs by mouth every 6 (six) hours as needed (indigestion).    Yes [provider]  amLODipine (NORVASC) 5 MG tablet Take 5 mg by mouth daily.   Yes [provider]  aspirin EC 81 MG tablet Take 81 mg by mouth daily.   Yes [provider]  b complex vitamins tablet Take 1 tablet by mouth daily.   Yes [provider]  bisacodyl (DULCOLAX) 10 MG suppository Place 10 mg rectally daily as needed for moderate constipation. 04/19/18  Yes [provider]  cholecalciferol (VITAMIN D) 25 MCG (1000 UT) tablet Take 2,000 Units by mouth daily. For 14 days starting 12.15.2020   Yes [provider]  Cholecalciferol 1000 units tablet Take 50,000 Units by mouth every 14 (fourteen) days.  12/05/17  Yes [provider]  diclofenac sodium (VOLTAREN) 1 % GEL Apply topically 3 (three) times daily. Apply to left shoulder topically three times daily    Yes [provider]  docusate sodium (COLACE) 100 MG capsule Take 100 mg by mouth 2 (two) times daily.   Yes [provider]  escitalopram (LEXAPRO) 20 MG tablet Take 20 mg by mouth daily.    Yes [provider]  ezetimibe (ZETIA) 10 MG tablet Take 10 mg by mouth daily.   Yes [provider]  ferrous sulfate 325 (65 FE) MG tablet Take 325 mg by mouth 2 (two) times daily with a meal.   Yes [provider]  folic acid (FOLVITE) 1 MG tablet Take 1 mg by mouth daily.   Yes [provider]  gabapentin (NEURONTIN) 300 MG capsule Take 300 mg by mouth 3  (three) times daily.    Yes [provider]  guaiFENesin (MUCINEX) 600 MG 12 hr tablet Take 600 mg by mouth 2 (two) times daily as needed for cough.    Yes [provider]  insulin lispro (HUMALOG) 100 UNIT/ML injection Inject 2-12 Units into the skin See admin instructions. CBG 151-200 =2UNITS CBG 201-250 =4UNITS CBG 251-300 =6UNITS CBG 301-350 =8UNITS CBG 351-400= 10 UNITS CBG 401+ =12UNITS CALL MD IF CBG ABOVE 450 03/06/18  Yes [provider]  ipratropium-albuterol (DUONEB) 0.5-2.5 (3) MG/3ML SOLN Take 3 mLs by nebulization every 6 (six) hours as needed (sob).    Yes [provider]  levothyroxine (SYNTHROID) 25 MCG tablet  Take 25 mcg by mouth daily before breakfast.   Yes [provider]  magnesium oxide (MAG-OX) 400 MG tablet Take 400 mg by mouth daily.   Yes [provider]  metoprolol succinate (TOPROL-XL) 25 MG 24 hr tablet Take 25 mg by mouth daily.   Yes [provider]  Multiple Vitamin (MULTIVITAMIN) tablet Take 1 tablet by mouth daily.   Yes [provider]  nitroGLYCERIN (NITROSTAT) 0.4 MG SL tablet Place 0.4 mg under the tongue every 5 (five) minutes as needed for chest pain.   Yes [provider]  oxyCODONE (OXY IR/ROXICODONE) 5 MG immediate release tablet Take 5 mg by mouth every 8 (eight) hours as needed for severe pain.   Yes [provider]  pantoprazole (PROTONIX) 40 MG tablet Take 40 mg by mouth daily.   Yes [provider]  polyethylene glycol (MIRALAX / GLYCOLAX) packet Take 17 g by mouth daily as needed for mild constipation or moderate constipation. 04/19/18  Yes [provider]  predniSONE (DELTASONE) 10 MG tablet Take 10 mg by mouth daily.   Yes [provider]  rivaroxaban (XARELTO) 10 MG TABS tablet Take 10 mg by mouth daily.   Yes [provider]  rOPINIRole (REQUIP) 0.5 MG tablet Take 0.5 mg by mouth at bedtime. 2100   Yes [provider]  rosuvastatin (CRESTOR) 5 MG tablet Take 5 mg by mouth daily.   Yes [provider]  simethicone (MYLICON) 563 MG chewable tablet Chew 125 mg by mouth every 6 (six) hours as needed for flatulence. 03/19/18  Yes [provider]  traZODone (DESYREL) 100 MG tablet Take 150 mg by mouth at bedtime.  04/04/18  Yes [provider]  vitamin C (ASCORBIC ACID) 500 MG tablet Take 500 mg by mouth 2 (two) times daily.   Yes [provider]  zinc gluconate 50 MG tablet Take 100 mg by mouth daily. For 14 days starting 12.15.2020   Yes [provider]  NON FORMULARY Low concentrated sweets diet - Regular texture, NAS (no added salt)    [provider]  OXYGEN Inhale into the lungs. O2 at 2l/min via nasal canula as needed for shortness of breath    [provider]   Liver Function Tests Recent Labs  Lab 09/07/19 0458 09/08/19 0527 09/09/19 0531  AST 12* 15 21  ALT 11 13 12   ALKPHOS 114 109 107  BILITOT 0.8 0.6 0.5  PROT 5.7* 5.4* 5.5*  ALBUMIN 2.4* 2.2* 2.3*   No results for input(s): LIPASE, AMYLASE in the last 168 hours. CBC Recent Labs  Lab 09/07/19 0458 09/08/19 0527 09/09/19 0531  WBC 9.9 11.0* 10.8*  NEUTROABS 9.1* 9.7* 8.7*  HGB 10.5* 10.1* 10.0*  HCT 34.8* 34.0* 32.7*  MCV 90.2 89.2 89.8  PLT 342 289 893   Basic Metabolic Panel Recent Labs  Lab 09/05/19 1336 09/05/19 2028 09/06/19 0500 09/07/19 0458 09/08/19 0527 09/08/19 1627 09/09/19 0531  NA 126* 125* 125* 128* 124*  --  124*  K 5.4* 5.4* 5.5* 5.6* 5.7* 5.6* 5.5*  CL 86* 87* 88* 91* 89*  --  89*  CO2 30 31 29 27 25   --  25  GLUCOSE 250* 235* 162* 162* 171*  --  200*  BUN 40* 39* 40* 47* 51*  --  62*  CREATININE 1.54* 1.38* 1.58* 1.77* 2.06*  --  2.39*  CALCIUM 8.6* 8.4* 8.5* 8.6* 8.1*  --  7.9*  PHOS  --   --  3.4  --   --   --   --    Iron/TIBC/Ferritin/ %Sat    Component Value Date/Time   IRON 45 06/02/2017 1029   TIBC 227 (L)  06/02/2017 1029   FERRITIN 71 09/08/2019 0527   IRONPCTSAT 20 06/02/2017 1029    Vitals:   09/09/19 0533 09/09/19 0730 09/09/19 0800 09/09/19 1327  BP:  110/72  (!) 149/129  Pulse: (!) 51 (!) 55  (!) 45  Resp:  16  16  Temp:  97.9 F (36.6 C)  (!) 97.4 F (36.3 C)  TempSrc:  Oral  Oral  SpO2: 100% 100% 100% 100%  Weight:      Height:        Exam Gen very obese No rash, cyanosis or gangrene Sclera anicteric, throat not seen Mild JVD Chest mostly clear, occ rales post RRR no MRG Abd soft ntnd no mass or ascites +bs markedly obese GU normal male w/ foley draining small amts dark brown urine MS no joint effusions or deformity Ext marked 3-4+ edema of LE's up to hips/ scrotum and lower abd wall Neuro is lethargic, mild invol myoclonic jerking    Home meds:  - amlodipine 5/ metoprolol xl 25   - trazodone 150/ ropinirole 0.5/ oxy tid prn/ gabapentin 300 tid/ escitalopram 20 qd  - crestor 5 / sl ntg prn/ ezetimibe 10/ aspirin 81  - rivaroxaban 10 qd  - prednisone 10 qd/ levothryoxine 25 ug/ duoneb qid prn  - prn's/ vitamins/ supplements   CT chest 12/26 >  IMPRESSION:  1. Mod-large L effusion w/ passive atx 2. Small R effusion 3. Ground glass / septal thickening c/w pulm edema 4.  Moderate pericard effusion 5. Constellation of findings c/w CHF   CXR 12/30 - L sided opacities, R side clear     Assessment/ Plan: 1. AKI - baseline creat 1.2 in 2019. Creat 1.5 on admission here, up to 2.39 today w/ poor UOP despite IV lasix 40-80 mg (~200 cc/d x 3).  Pt has severe anasarca but also pulm edema per the chest CT. EF is lower than prior. Pt w/ severe R >> L CHF, assoc hyponatremia, not responding to initial diuretics and now w/ AKI.  Pt is bedridden from morbid obesity/ deconditioning living in SNF for past year.  Doubt that we have much to offer.  Very high dose lasix would be only option but I think w/ rising creatinine there is low chance this would help.  Pt is not HD  candidate.  Suspect transition to comfort care would be best course.  Patient is already showing signs of decline w/ lethargy and myoclonic jerking. I have shared my thoughts w/ the patient.  Will d/w primary team.        Kelly Splinter  MD 09/09/2019, 2:39 PM

## 2019-09-09 NOTE — Progress Notes (Signed)
Pt's skin reassessed by 2 RNs. There is no deep tissue injury noted to right or left thigh. Pt's bottom area is blanchable but there is noted to be severe moisture associated skin damage area is very excoriated and bleeding in some areas. Entire area is very tender and painful to clean. Gerrahds butt creme applied liberally to entire area. Pt's left  Big toe noted to have eschar and black on top where nailbed should be. The second toe is also noted to have eschar and black. The right second tow is noted to have a small red area no drainage noted to that area. Areas were cleansed. Bilateral lower legs remained wrapped after being cleansed earlier and Eucerin creme was applied,

## 2019-09-09 NOTE — Progress Notes (Deleted)
Progress Note  Patient Name: [name] Date of Encounter: 09/09/2019  Primary Cardiologist: Candee Furbish, MD  Subjective   Pt continues to have poor urine output.  Received albumin this morning.  Apparently had an episode of chest pain overnight however patient does not recall.  Denies chest pain this a.m., shortness of breath  Inpatient Medications    Scheduled Meds: . amLODipine  5 mg Oral Daily  . arformoterol  15 mcg Nebulization BID  . vitamin C  500 mg Oral BID  . aspirin EC  81 mg Oral Daily  . budesonide (PULMICORT) nebulizer solution  0.25 mg Nebulization BID  . Chlorhexidine Gluconate Cloth  6 each Topical Daily  . Chlorhexidine Gluconate Cloth  6 each Topical Q0600  . docusate sodium  100 mg Oral BID  . doxycycline  100 mg Oral Q12H  . escitalopram  20 mg Oral Daily  . ezetimibe  10 mg Oral Daily  . ferrous sulfate  325 mg Oral BID WC  . folic acid  1 mg Oral Daily  . gabapentin  300 mg Oral TID  . Gerhardt's butt cream   Topical BID  . hydrocerin   Topical BID  . insulin aspart  0-5 Units Subcutaneous QHS  . insulin aspart  0-9 Units Subcutaneous TID WC  . insulin glargine  10 Units Subcutaneous Daily  . levothyroxine  25 mcg Oral QAC breakfast  . magnesium oxide  400 mg Oral Daily  . metoprolol succinate  25 mg Oral Daily  . multivitamin with minerals  1 tablet Oral Daily  . mupirocin ointment  1 application Nasal BID  . mupirocin ointment   Topical Daily  . pantoprazole  40 mg Oral Daily  . predniSONE  10 mg Oral Q breakfast  . rivaroxaban  20 mg Oral Q supper  . rOPINIRole  0.5 mg Oral QHS  . rosuvastatin  5 mg Oral Daily   Continuous Infusions:  PRN Meds: guaiFENesin, ipratropium-albuterol, nitroGLYCERIN, oxyCODONE, traZODone   Vital Signs    Vitals:   09/09/19 0532 09/09/19 0533 09/09/19 0730 09/09/19 0800  BP: 110/76  110/72   Pulse: (!) 47 (!) 51 (!) 55   Resp: 16  16   Temp:   97.9 F (36.6 C)   TempSrc:   Oral   SpO2: 100%  100% 100% 100%  Weight:      Height:        Intake/Output Summary (Last 24 hours) at 09/09/2019 0906 Last data filed at 09/09/2019 0600 Gross per 24 hour  Intake 430 ml  Output 280 ml  Net 150 ml   Filed Weights   09/05/19 1317 09/07/19 0400  Weight: (!) 157 kg (!) 168 kg    Physical Exam   General: Ill appearing,  NAD Skin: Multiple lesions on upper and lower extremities Neck: Negative for carotid bruits. No JVD Lungs:Clear to ausculation bilaterally. No wheeze. Breathing is unlabored. Cardiovascular: Irregularly irregular with S1 S2. No murmurs, rubs, gallops, or LV heave appreciated. Abdomen: Soft, non-tender, non-distended. No obvious abdominal masses. Extremities: 3-4BLE to knees with wraps in place. DP pulses 1+ bilaterally Neuro: Alert and oriented. No focal deficits. No facial asymmetry. MAE spontaneously. Psych: Responds to questions appropriately with normal affect.    Labs    Chemistry Recent Labs  Lab 09/07/19 0458 09/08/19 0527 09/08/19 1627 09/09/19 0531  NA 128* 124*  --  124*  K 5.6* 5.7* 5.6* 5.5*  CL 91* 89*  --  89*  CO2 27 25  --  25  GLUCOSE 162* 171*  --  200*  BUN 47* 51*  --  62*  CREATININE 1.77* 2.06*  --  2.39*  CALCIUM 8.6* 8.1*  --  7.9*  PROT 5.7* 5.4*  --  5.5*  ALBUMIN 2.4* 2.2*  --  2.3*  AST 12* 15  --  21  ALT 11 13  --  12  ALKPHOS 114 109  --  107  BILITOT 0.8 0.6  --  0.5  GFRNONAA 38* 31*  --  26*  GFRAA 44* 36*  --  30*  ANIONGAP 10 10  --  10     Hematology Recent Labs  Lab 09/07/19 0458 09/08/19 0527 09/09/19 0531  WBC 9.9 11.0* 10.8*  RBC 3.86* 3.81* 3.64*  HGB 10.5* 10.1* 10.0*  HCT 34.8* 34.0* 32.7*  MCV 90.2 89.2 89.8  MCH 27.2 26.5 27.5  MCHC 30.2 29.7* 30.6  RDW 16.7* 16.9* 17.0*  PLT 342 289 241    Cardiac EnzymesNo results for input(s): TROPONINI in the last 168 hours. No results for input(s): TROPIPOC in the last 168 hours.   BNP Recent Labs  Lab 09/05/19 1336 09/05/19 2028  09/07/19 0754  BNP 261.4* 204.6* 234.1*     DDimer No results for input(s): DDIMER in the last 168 hours.   Radiology    DG Chest 1 View  Result Date: 09/07/2019 CLINICAL DATA:  Status post left thoracentesis EXAM: CHEST  1 VIEW COMPARISON:  09/05/2019 FINDINGS: Cardiac shadow remains enlarged. Recent left thoracentesis has been performed with significant reduction in left pleural effusion. No pneumothorax is noted. The overall inspiratory effort is poor. Mild basilar atelectasis on the left is noted. Postsurgical changes near the gastroesophageal junction are seen. IMPRESSION: No pneumothorax following left thoracentesis. Electronically Signed   By: Inez Catalina M.D.   On: 09/07/2019 12:32   DG CHEST PORT 1 VIEW  Result Date: 09/09/2019 CLINICAL DATA:  Respiratory failure. EXAM: PORTABLE CHEST 1 VIEW COMPARISON:  September 07, 2019. FINDINGS: Stable cardiomegaly. No pneumothorax is noted. Right lung is clear. Increased left perihilar and basilar opacity is noted concerning for worsening pneumonia or atelectasis. Probable small left pleural effusion is noted. Bony thorax is unremarkable. IMPRESSION: Increased left perihilar and basilar opacity is noted concerning for worsening pneumonia or atelectasis. Probable small left pleural effusion is noted as well. Stable mild cardiomegaly is noted. No pneumothorax is noted. Electronically Signed   By: Marijo Conception M.D.   On: 09/09/2019 07:29   VAS [place] UPPER EXTREMITY VENOUS DUPLEX  Result Date: 09/08/2019 UPPER VENOUS STUDY  Indications: Swelling Limitations: Body habitus and poor ultrasound/tissue interface. Comparison Study: No prior study Performing Technologist: Maudry Mayhew MHA, RDMS, RVT, RDCS  Examination Guidelines: A complete evaluation includes B-mode imaging, spectral Doppler, color Doppler, and power Doppler as needed of all accessible portions of each vessel. Bilateral testing is considered an integral part of a complete  examination. Limited examinations for reoccurring indications may be performed as noted.  Right Findings: +----------+------------+---------+-----------+----------+--------------+ RIGHT     CompressiblePhasicitySpontaneousProperties   Summary     +----------+------------+---------+-----------+----------+--------------+ Subclavian                                          Not visualized +----------+------------+---------+-----------+----------+--------------+  Left Findings: +----------+------------+---------+-----------+----------+--------------+ LEFT      CompressiblePhasicitySpontaneousProperties   Summary     +----------+------------+---------+-----------+----------+--------------+ IJV  Full       Yes       Yes                             +----------+------------+---------+-----------+----------+--------------+ Subclavian    Full       Yes       Yes                             +----------+------------+---------+-----------+----------+--------------+ Axillary      Full       Yes       Yes                             +----------+------------+---------+-----------+----------+--------------+ Brachial      Full       Yes       Yes                             +----------+------------+---------+-----------+----------+--------------+ Radial        Full                                                 +----------+------------+---------+-----------+----------+--------------+ Ulnar                                               Not visualized +----------+------------+---------+-----------+----------+--------------+ Cephalic                                            Not visualized +----------+------------+---------+-----------+----------+--------------+  Summary:  Left: No evidence of deep vein thrombosis in the upper extremity. No evidence of superficial vein thrombosis in the upper extremity. This was a limited exam.  *See table(s) above for  measurements and observations.  Diagnosing physician: Ruta Hinds MD Electronically signed by Ruta Hinds MD on 09/08/2019 at 1:46:56 PM.    Final    Telemetry    09/09/2019 AF with slow ventricular response>>HR 50's- Personally Reviewed  ECG    No new tracing as of 09/09/19- Personally Reviewed  Cardiac Studies   ECHO:09/06/2019 1. Left ventricular ejection fraction, by visual estimation, is 40 to 45%. The left ventricle has mild to moderately decreased function. There is no left ventricular hypertrophy. 2. Moderate hypokinesis of the left ventricular, entire apical segment. 3. Left ventricular diastolic function could not be evaluated. 4. The left ventricle demonstrates regional wall motion abnormalities. 5. Global right ventricle has normal systolic function.The right ventricular size is normal. No increase in right ventricular wall thickness. 6. Left atrial size was moderately dilated. 7. Right atrial size was normal. 8. Moderate pericardial effusion. 9. The pericardial effusion is circumferential. 10. Moderate mitral annular calcification. 11. The mitral valve is normal in structure. Trivial mitral valve regurgitation. 12. The tricuspid valve is normal in structure. 13. The aortic valve is tricuspid. Aortic valve regurgitation is trivial. 14. The pulmonic valve was grossly normal. Pulmonic valve regurgitation is not visualized. 15. TR signal is inadequate for assessing pulmonary artery systolic pressure. 16.  The inferior vena cava is dilated in size with >50% respiratory variability, suggesting right atrial pressure of 8 mmHg.  Patient Profile     73 y.o. male w/ hx mild to moderate CADw/ 90% RCA rx medically, obesity, HTN, HLD DM with neuropathy and neurogenic bladder, OSA not on CPAP,bullous pemphigoid,syncope, sinus pause 2nd to OSA (no arrhtymias on monitor),EF35-40% at cath 2016with subsequent normalization, CKD stage III,right  poplitealDVT12/2018,CVA,was admitted 12/26with pneumonia and respiratory failure. He was seen 12/28 for CHF/volume overloadand abnormal echo.  Assessment & Plan    1.  Acute on chronic respiratory failure with hypoxia: -S/p thoracentesis for left pleural effusion 09/07/2019 with 1300 mL output -MRSA, PCR positive -Per IM/PCCM  2.  Acute on chronic systolic and diastolic CHF: -Most recent echocardiogram in 2020 with LVEF at 40 to 45% with LV hypokinesis in the apex -Normal HST -Weight, 370lb  -I&O, net +1.8 L -Lasix currently on hold>>received 2 units albumin this AM  -Recommendations for PICC line to evaluate CVP for full understanding of fluid volume status -Wound care contacted yesterday regarding Ace wraps versus Unna boots>>currently on -Continues to have 3-4+ pitting edema to knees at least   3.  Pericardial effusion: -Likely secondary to acute CHF exacerbation -Moderate per echocardiogram without evidence of tamponade -Plan is to recheck echo later this week  4.  Atrial fibrillation: -Slow ventricular response with heart rate remaining in the 40s -Has been on beta-blocker therapy>>> Toprol 25 mg p.o. daily>> will hold for now -Continue Xarelto  5.  Known CAD: -Plan is for continued medical management for CAD including ASA, statin and beta-blocker>>hold BB in the setting of hypotension and bradycardia  -Had an episode of chest pain earlier today in which nitro was given with relief>>>pain denies  -EKG shows AF with slow ventricular response and no acute changes  6.  AKI: -Creatinine today, 2.39, up from 2.06 yesterday -Albumin, 2.3>>> likely intravascularly dry>>received 2 units albumin this AM  -May trail Lasix to observe for response>>will likely need nephrology consultation for assistance with diuretics    Signed, Kathyrn Drown NP-C [place] Pager: [phone] 09/09/2019, 9:06 AM     For questions or updates, please contact   Please consult  [email or web address] for contact info under Cardiology/STEMI.

## 2019-09-10 DIAGNOSIS — N189 Chronic kidney disease, unspecified: Secondary | ICD-10-CM

## 2019-09-10 DIAGNOSIS — N179 Acute kidney failure, unspecified: Secondary | ICD-10-CM

## 2019-09-10 LAB — CBC WITH DIFFERENTIAL/PLATELET
Abs Immature Granulocytes: 0.29 10*3/uL — ABNORMAL HIGH (ref 0.00–0.07)
Basophils Absolute: 0 10*3/uL (ref 0.0–0.1)
Basophils Relative: 0 %
Eosinophils Absolute: 0.1 10*3/uL (ref 0.0–0.5)
Eosinophils Relative: 1 %
HCT: 32.1 % — ABNORMAL LOW (ref 39.0–52.0)
Hemoglobin: 9.8 g/dL — ABNORMAL LOW (ref 13.0–17.0)
Immature Granulocytes: 3 %
Lymphocytes Relative: 7 %
Lymphs Abs: 0.8 10*3/uL (ref 0.7–4.0)
MCH: 27.7 pg (ref 26.0–34.0)
MCHC: 30.5 g/dL (ref 30.0–36.0)
MCV: 90.7 fL (ref 80.0–100.0)
Monocytes Absolute: 0.8 10*3/uL (ref 0.1–1.0)
Monocytes Relative: 7 %
Neutro Abs: 9.4 10*3/uL — ABNORMAL HIGH (ref 1.7–7.7)
Neutrophils Relative %: 82 %
Platelets: 223 10*3/uL (ref 150–400)
RBC: 3.54 MIL/uL — ABNORMAL LOW (ref 4.22–5.81)
RDW: 16.8 % — ABNORMAL HIGH (ref 11.5–15.5)
WBC: 11.4 10*3/uL — ABNORMAL HIGH (ref 4.0–10.5)
nRBC: 0.4 % — ABNORMAL HIGH (ref 0.0–0.2)

## 2019-09-10 LAB — COMPREHENSIVE METABOLIC PANEL
ALT: 13 U/L (ref 0–44)
AST: 15 U/L (ref 15–41)
Albumin: 2.5 g/dL — ABNORMAL LOW (ref 3.5–5.0)
Alkaline Phosphatase: 96 U/L (ref 38–126)
Anion gap: 11 (ref 5–15)
BUN: 65 mg/dL — ABNORMAL HIGH (ref 8–23)
CO2: 25 mmol/L (ref 22–32)
Calcium: 7.8 mg/dL — ABNORMAL LOW (ref 8.9–10.3)
Chloride: 87 mmol/L — ABNORMAL LOW (ref 98–111)
Creatinine, Ser: 2.66 mg/dL — ABNORMAL HIGH (ref 0.61–1.24)
GFR calc Af Amer: 27 mL/min — ABNORMAL LOW (ref >60)
GFR calc non Af Amer: 23 mL/min — ABNORMAL LOW (ref >60)
Glucose, Bld: 186 mg/dL — ABNORMAL HIGH (ref 70–99)
Potassium: 5.3 mmol/L — ABNORMAL HIGH (ref 3.5–5.1)
Sodium: 123 mmol/L — ABNORMAL LOW (ref 135–145)
Total Bilirubin: 0.7 mg/dL (ref 0.3–1.2)
Total Protein: 5.2 g/dL — ABNORMAL LOW (ref 6.5–8.1)

## 2019-09-10 LAB — GLUCOSE, CAPILLARY
Glucose-Capillary: 170 mg/dL — ABNORMAL HIGH (ref 70–99)
Glucose-Capillary: 246 mg/dL — ABNORMAL HIGH (ref 70–99)
Glucose-Capillary: 191 mg/dL — ABNORMAL HIGH (ref 70–99)
Glucose-Capillary: 250 mg/dL — ABNORMAL HIGH (ref 70–99)

## 2019-09-10 LAB — LACTIC ACID, PLASMA: Lactic Acid, Venous: 1.2 mmol/L (ref 0.5–1.9)

## 2019-09-10 MED ORDER — FUROSEMIDE 10 MG/ML IJ SOLN
100.0000 mg | Freq: Once | INTRAVENOUS | Status: DC
  Filled 2019-09-10: qty 10

## 2019-09-10 MED ORDER — JUVEN PO PACK
1.0000 | PACK | Freq: Two times a day (BID) | ORAL | Status: DC
  Administered 2019-09-10 – 2019-09-16 (×4): 1 via ORAL
  Filled 2019-09-10 (×15): qty 1

## 2019-09-10 MED ORDER — APIXABAN 2.5 MG PO TABS
2.5000 mg | ORAL_TABLET | Freq: Two times a day (BID) | ORAL | Status: DC
  Administered 2019-09-10 – 2019-09-16 (×11): 2.5 mg via ORAL
  Filled 2019-09-10 (×12): qty 1

## 2019-09-10 MED ORDER — PRO-STAT SUGAR FREE PO LIQD
30.0000 mL | Freq: Three times a day (TID) | ORAL | Status: DC
  Administered 2019-09-10 – 2019-09-16 (×13): 30 mL via ORAL
  Filled 2019-09-10 (×14): qty 30

## 2019-09-10 MED ORDER — APIXABAN 5 MG PO TABS: 5.0000 mg | ORAL_TABLET | Freq: Two times a day (BID) | ORAL | Status: DC

## 2019-09-10 MED ORDER — GABAPENTIN 300 MG PO CAPS
300.0000 mg | ORAL_CAPSULE | Freq: Every day | ORAL | Status: DC
  Administered 2019-09-10 – 2019-09-16 (×7): 300 mg via ORAL
  Filled 2019-09-10 (×7): qty 1

## 2019-09-10 NOTE — Progress Notes (Signed)
Initial Nutrition Assessment  RD working remotely.   DOCUMENTATION CODES:   Morbid obesity  INTERVENTION:  - will order 30 mL Prostat BID, each supplement provides 100 kcal and 15 grams of protein. - will order Juven BID, each packet provides 95 calories, 2.5 grams of protein (collagen), and 9.8 grams of carbohydrate (3 grams sugar); also contains 7 grams of L-arginine and L-glutamine, 300 mg vitamin C, 15 mg vitamin E, 1.2 mcg vitamin B-12, 9.5 mg zinc, 200 mg calcium, and 1.5 g  Calcium Beta-hydroxy-Beta-methylbutyrate to support wound healing.   NUTRITION DIAGNOSIS:   Increased nutrient needs related to acute illness, wound healing as evidenced by estimated needs.  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 He presented to the ED from [facility] on 12/26 due to hypoxia requiring 8L O2 in the ED.CT showed bilateral pleural effusions (L>R), pulmonary edema, and cardiomegaly with pericardial effusion.  Per flow sheet documentation, patient ate 100% of breakfast on 12/29; no other intakes documented since admission. Patient reports very good appetite at baseline. Appetite since admission slightly decreased, but still feels able to eat well. No difficulty chewing or swallowing.   Per chart review, weight on admission (12/26) was 246 and weight on 12/28 was 370 lb. No weight recorded since 12/28.    Per notes: - acute on chronic hypoxic respiratory failure d/t pulmonary edema 2/2 acute on CHF--significant fluid overload  - third  spacing--compression wraps; s/p albumin - AKI--s/p lasix; very poor urine output; Nephrology following and state patient is not a HD candidate - PNA - large L-sided PE--thoracentesis 12/28 with 1300 ml removed - acute metabolic encephalopathy--resolved - hyponatremia and hypochloridemia - hyperkalemia - type 2 DM with peripheral neuropathy--A1c: 7.7%   Labs reviewed; CBG: 170 mg/dl, Na: 123 mmol/l, K: 5.3 mmol/l, Cl: 87 mmol/l, BUN: 65 mg/dl, creatinine: 2.66 mg/dl, Ca: 7.8 mg/dl, GFR: 23 ml/min. Medications reviewed; 25 g albumin x1 dose 12/20, 500 mg ascorbic acid BID, 100 mg colace BID, 325 mg ferrous sulfate BID, 1 mg folvite/day, sliding scale novolog, 10 units lantus/day, 25 mcg oral synthroid/day, 400 mg mag-ox/day, daily multivitamin with minerals, 10 mg deltasone/day, 10 g lokelma x1 dose 12/30.      NUTRITION - FOCUSED PHYSICAL EXAM:  unable to complete at this time.   Diet Order:   Diet Order            Diet Heart Room service appropriate? Yes; Fluid consistency: Thin; Fluid restriction: 1500 mL Fluid  Diet effective now              EDUCATION NEEDS:   Not appropriate for education at this time  Skin:  Skin Assessment: Skin Integrity Issues: Skin Integrity Issues:: Stage II, Unstageable, DTI DTI: sacrum; R thigh Stage II: toe Unstageable: L toe  Last BM:  12/30  Height:   Ht Readings from Last 1 Encounters:  09/05/19 6\' 2"  (1.88 m)    Weight:   Wt Readings from Last 1 Encounters:  09/07/19 (!) 168 kg    Ideal Body Weight:  86.4 kg  BMI:  Body mass index is  47.55 kg/m.  Estimated Nutritional Needs:   Kcal:  2100-2300 kcal  Protein:  105-115 grams  Fluid:  >/= 2.2 L/day      Jarome Matin, MS, RD, LDN, [facility] Inpatient Clinical Dietitian Pager # [phone] After hours/weekend pager # [phone]

## 2019-09-10 NOTE — Progress Notes (Signed)
Compression wraps changed on bilat legs per order. Eucerin and Gerhardtts reordered. MASD covering buttocks, some spots bleeding. Gerhardtts applied. Pt also with bleeding fissures between abdominal folds. Cleansed, dried, and powder applied.

## 2019-09-10 NOTE — Plan of Care (Signed)
  Problem: Education: Goal: Knowledge of General Education information will improve Description: Including pain rating scale, medication(s)/side effects and non-pharmacologic comfort measures Outcome: Progressing   Problem: Clinical Measurements: Goal: Respiratory complications will improve Outcome: Progressing Goal: Cardiovascular complication will be avoided Outcome: Progressing   Problem: Nutrition: Goal: Adequate nutrition will be maintained Outcome: Progressing   Problem: Coping: Goal: Level of anxiety will decrease Outcome: Progressing   Problem: Elimination: Goal: Will not experience complications related to bowel motility Outcome: Progressing Goal: Will not experience complications related to urinary retention Outcome: Progressing   Problem: Pain Managment: Goal: General experience of comfort will improve Outcome: Progressing   Problem: Safety: Goal: Ability to remain free from injury will improve Outcome: Progressing   Problem: Skin Integrity: Goal: Risk for impaired skin integrity will decrease Outcome: Progressing

## 2019-09-10 NOTE — Progress Notes (Signed)
Palliative care progress note  Reason for visit: goals of care in light of worsening renal failure  Chart reviewed, discussed with Dr. Joseph and bedside RN.  I met today with [name].  He was awake and alert and followed to seem majority of conversation, but I do have concerns if he is fully processing information as it is given to him.   I introduced palliative care as specialized medical care for people living with serious illness. It focuses on providing relief from the symptoms and stress of a serious illness. The goal is to improve quality of life for both the patient and the family.  We discussed clinical course this admission including multiple comorbidities including cardiac, respiratory, and renal systems.  I discussed with him concern particularly about his renal failure and that there is real possibility that his time is short regardless of interventions due to his kidney failure.  We discussed difference between a aggressive medical intervention path and a palliative, comfort focused care path.    He expressed being sad with not taking care of himself in the past and that he would do anything asked of him to get better.  Discussed that this is likely to be irreversible and he is not a candidate for dialysis.    He expressed that his brother, [name], helps with medical decisions.    Questions and concerns addressed.   PMT will follow up tomorrow.  - DNR/DNI - Discussed with [name] regarding renal failure and likelihood that this may be terminal regardless of interventions.  He was surprised at first, but seemed to be accepting of this information. - I am not sure if he is really processing information that is given to him.  We therefore planned to discuss again tomorrow while involving his brother via phone at that time as well.  I think it will be easier to determine his understanding based upon his ability to retain/process information we discussed this evening (he reports that  this is first time he has heard information despite other providers also discussing with him previously). - He was very clear in expressing appreciation for care received thus far.  Total time: 40 minutes Greater than 50%  of this time was spent counseling and coordinating care related to the above assessment and plan.   , MD [place] [facility] Team [phone]  

## 2019-09-10 NOTE — Progress Notes (Signed)
[name]  Subjective: no c/o, 100 cc UOP, creat up mid 2's  Vitals:   09/09/19 2230 09/10/19 0555 09/10/19 0636 09/10/19 0735  BP: 110/62 110/70    Pulse:   (!) 55   Resp:   20   Temp:   97.8 F (36.6 C)   TempSrc:   Oral   SpO2:   100% 100%  Weight:      Height:        Exam: Gen awakens to voice and falls asleep during conversation, Ox 3 w/ prodding, mild myoclonic jerking Mild JVD Chest mostly clear, occ rales post RRR no MRG Abd soft ntnd no mass or ascites +bs markedly obese GU normal male w/ foley draining small amts dark brown urine MS no joint effusions or deformity Ext marked 3-4+ edema of LE's up to hips/ scrotum and abd wall Neuro is lethargic, mild invol myoclonic jerking    Home meds:  - amlodipine 5/ metoprolol xl 25   - trazodone 150/ ropinirole 0.5/ oxy tid prn/ gabapentin 300 tid/ escitalopram 20 qd  - crestor 5 / sl ntg prn/ ezetimibe 10/ aspirin 81  - rivaroxaban 10 qd  - prednisone 10 qd/ levothryoxine 25 ug/ duoneb qid prn  - prn's/ vitamins/ supplements   CT chest 12/26 >  IMPRESSION:  1. Mod-large L effusion w/ passive atx 2. Small R effusion 3. Ground glass / septal thickening c/w pulm edema 4.  Moderate pericard effusion 5. Constellation of findings c/w CHF   CXR 12/30 - L sided opacities, R side clear     Assessment/ Plan: 1. AKI - baseline creat 1.2 in 2019. Creat 1.5 on admission here, was given IV lasix 40-80 mg x 48hrs and creat increased so lasix dc'd.  Did not respond well to the IV lasix and UOP has been < 300 cc/d since admission. Pt has severe anasarca but also pulm edema per the chest CT. EF is lower than prior at 45%. Pt w/ severe R >> L CHF, assoc hyponatremia, not responding to initial diuretics and now w/ oliguric AKI.  Pt is bedridden from morbid obesity/ deconditioning living in SNF for past year. Today UOP remains low and creat continues to rise, UOP 100- 200 cc/ 24 hrs. Would be  reasonable to try high-dose IV lasix but don't think it will change the outcome. Don't think he will survive this. Pall care to see this afternoon.   2. CHF - biventricular w/ pulm edema by CT and anasarca 3. Morbid obesity    [name] 09/10/2019, 2:00 PM  Inpatient medications: . amLODipine  5 mg Oral Daily  . apixaban  2.5 mg Oral BID  . arformoterol  15 mcg Nebulization BID  . vitamin C  500 mg Oral BID  . aspirin EC  81 mg Oral Daily  . budesonide (PULMICORT) nebulizer solution  0.25 mg Nebulization BID  . Chlorhexidine Gluconate Cloth  6 each Topical Daily  . Chlorhexidine Gluconate Cloth  6 each Topical Q0600  . docusate sodium  100 mg Oral BID  . escitalopram  20 mg Oral Daily  . ezetimibe  10 mg Oral Daily  . feeding supplement (PRO-STAT SUGAR FREE 64)  30 mL Oral TID BM  . ferrous sulfate  325 mg Oral BID WC  . folic acid  1 mg Oral Daily  . gabapentin  300 mg Oral QHS  . Gerhardt's butt cream   Topical BID  . hydrocerin   Topical BID  .  insulin aspart  0-5 Units Subcutaneous QHS  . insulin aspart  0-9 Units Subcutaneous TID WC  . insulin glargine  10 Units Subcutaneous Daily  . levothyroxine  25 mcg Oral QAC breakfast  . magnesium oxide  400 mg Oral Daily  . multivitamin with minerals  1 tablet Oral Daily  . mupirocin ointment  1 application Nasal BID  . mupirocin ointment   Topical Daily  . nutrition supplement (JUVEN)  1 packet Oral BID BM  . pantoprazole  40 mg Oral Daily  . predniSONE  10 mg Oral Q breakfast  . rOPINIRole  0.5 mg Oral QHS  . rosuvastatin  5 mg Oral Daily   . furosemide     guaiFENesin, ipratropium-albuterol, nitroGLYCERIN, oxyCODONE

## 2019-09-10 NOTE — Progress Notes (Addendum)
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  Per notes he apparently required 8 L to maintain sats above 90%.  CT scan on admission showed bilateral pleural effusions left greater than right, pulmonary edema, cardiomegaly with pericardial effusion.  He was Covid negative.  He was started on diuretics, admitted also underwent thoracentesis, fluid was transudative.  2D echo done shortly after admission showed newly depressed EF of about 40% with left apical wall motion abnormality, and cardiology was consulted.  He was given diuresis for couple days however his creatinine continued to get worse, urine output remains very poor, oliguric, Lasix was stopped but despite that his kidney failure persisted.  Nephrology consulted on 12/30-NOT HD candidate, recommended comfort care.  Of note, palliative also following  Subjective / 24h Interval events: -Some ongoing chest discomfort -Poor p.o. intake, remains bedbound  Assessment & Plan:  Acute on chronic hypoxic respiratory failure  Bilateral pleural effusions Moderate pericardial effusion Acute on chronic systolic and diastolic CHF Cardiorenal syndrome, right greater than left heart failure -Acute on chronic systolic, diastolic CHF, complicated by progressive AKI and oliguria  -Patient had bilateral pleural effusions left greater than right, status post thoracentesis on the left, with transudate of fluid, cultures negative, also moderate pericardial effusion -And anasarca -2D echocardiogram with EF of 40 to 45% with  left apical hypokinesis, cardiology following -Poor response to diuretics, subsequently held due to worsening contraction alkalosis, now with worsening urine output and oliguria -In addition has hypoalbuminemia which is also contributing to his third spacing -Nephrology consulted, they felt the patient is not a dialysis candidate and recommended comfort focused care, discussed with Dr. Jonnie Finner today, his recommendations are the same, we also talked about attempting single high-dose Lasix x1 now -Addendum: updated brother and only family [name] Yawn in [place], he agrees with comfort focused care, he tells me pt has been declining and they would never want Dialysis for him   Acute kidney injury -His creatinine was normal in 2019, with no recent baseline system, now admitted with anasarca, third spacing with pleural effusions, extensive edema up to his upper thigh, moderate pericardial effusion -Hypoalbuminemia as well -Creatinine continues to worsen, diuretics were held, urine output remains very poor, oliguric for the last 3 days -Suspect Cardiorenal syndrome -Nephrology consulting, not felt to be a dialysis candidate, recommended comfort focused care   Pneumonia  -Clinically doubt this, suspect all his symptoms were from CHF and pleural effusion only, Covid PCR was negative, did receive broad-spectrum antibiotics IV initially and subsequently oral doxycycline and has completed 5-day course of antibiotics now discontinued  OSA -Continue nightly CPAP  Acute metabolic encephalopathy -Patient was altered at presentation likely because of hypercapnia. -Overall he is more alert but still remains intermittently confused  Hyperkalemia -Due to worsening AKI, hyperkalemia is persistent despite daily Lokelma  Type 2 diabetes mellitus with peripheral neuropathy, with hyperglycemia -A1c 7.7.  Continue insulin  History of pulmonary embolism -We will change to renal dose Eliquis  H/o bullous  pemphigoid -Home meds include prednisone 10 mg daily, methotrexate.   -Was placed briefly on IV Decadron given  concern for Covid, discontinue that and placed back on home prednisone  Neurogenic bladder Indwelling Foley catheter History of ESBL -No urinary symptoms in this admission.  Urine cultures without significant growth  Morbid obesity  -BMI of 47  Chronic ambulatory dysfunction Chronic pain Restless leg syndrome Anxiety/depression -On tramadol, oxycodone as needed. -Continue Lexapro, Requip, discontinue trazodone  Hypothyroidism -Continue Synthroid was normal on 12/28 at 3.4  GERD/chronic anemia -Continue ferrous sulfate, Protonix, -Hemoglobin stable.  Sacraldecubitus ulcer - POA -nursing to do wound care and consider wound care consultation   DVT prophylaxis: change to renal dose Eliquis Code Status: DNR Family Communication: No family at bedside, called and had an extensive discussion with brother Cirilo Canner, he agrees to comfort focused care if he declines further Disposition Plan: home when ready    Pressure Injury 09/06/19 Toe (Comment  which one) Right Stage 2 -  Partial thickness loss of dermis presenting as a shallow open injury with a red, pink wound bed without slough. (Active)  09/06/19 1804  Location: Toe (Comment  which one) (2nd toe )  Location Orientation: Right  Staging: Stage 2 -  Partial thickness loss of dermis presenting as a shallow open injury with a red, pink wound bed without slough.  Wound Description (Comments):   Present on Admission: Yes     Pressure Injury 09/06/19 Toe (Comment  which one) Anterior;Left Unstageable - Full thickness tissue loss in which the base of the injury is covered by slough (yellow, tan, gray, green or brown) and/or eschar (tan, brown or black) in the wound bed. (Active)  09/06/19 1806  Location: Toe (Comment  which one) (2nd toe)  Location Orientation: Anterior;Left  Staging: Unstageable - Full thickness  tissue loss in which the base of the injury is covered by slough (yellow, tan, gray, green or brown) and/or eschar (tan, brown or black) in the wound bed.  Wound Description (Comments):   Present on Admission: Yes   Scheduled Meds: . amLODipine  5 mg Oral Daily  . arformoterol  15 mcg Nebulization BID  . vitamin C  500 mg Oral BID  . aspirin EC  81 mg Oral Daily  . budesonide (PULMICORT) nebulizer solution  0.25 mg Nebulization BID  . Chlorhexidine Gluconate Cloth  6 each Topical Daily  . Chlorhexidine Gluconate Cloth  6 each Topical Q0600  . docusate sodium  100 mg Oral BID  . escitalopram  20 mg Oral Daily  . ezetimibe  10 mg Oral Daily  . feeding supplement (PRO-STAT SUGAR FREE 64)  30 mL Oral TID BM  . ferrous sulfate  325 mg Oral BID WC  . folic acid  1 mg Oral Daily  . gabapentin  300 mg Oral QHS  . Gerhardt's butt cream   Topical BID  . hydrocerin   Topical BID  . insulin aspart  0-5 Units Subcutaneous QHS  . insulin aspart  0-9 Units Subcutaneous TID WC  . insulin glargine  10 Units Subcutaneous Daily  . levothyroxine  25 mcg Oral QAC breakfast  . magnesium oxide  400 mg Oral Daily  . multivitamin with minerals  1 tablet Oral Daily  . mupirocin ointment  1 application Nasal BID  . mupirocin ointment   Topical Daily  . nutrition supplement (JUVEN)  1 packet Oral BID BM  . pantoprazole  40 mg Oral Daily  . predniSONE  10 mg Oral Q breakfast  . rivaroxaban  20 mg Oral Q supper  . rOPINIRole  0.5 mg Oral  QHS  . rosuvastatin  5 mg Oral Daily   Continuous Infusions:  PRN Meds:.guaiFENesin, ipratropium-albuterol, nitroGLYCERIN, oxyCODONE  Consultants:  Cardiology   Procedures:  2D echo:  IMPRESSIONS    1. Left ventricular ejection fraction, by visual estimation, is 40 to 45%. The left ventricle has mild to moderately decreased function. There is no left ventricular hypertrophy.  2. Moderate hypokinesis of the left ventricular, entire apical segment.  3. Left  ventricular diastolic function could not be evaluated.  4. The left ventricle demonstrates regional wall motion abnormalities.  5. Global right ventricle has normal systolic function.The right ventricular size is normal. No increase in right ventricular wall thickness.  6. Left atrial size was moderately dilated.  7. Right atrial size was normal.  8. Moderate pericardial effusion.  9. The pericardial effusion is circumferential. 10. Moderate mitral annular calcification. 11. The mitral valve is normal in structure. Trivial mitral valve regurgitation. 12. The tricuspid valve is normal in structure. 13. The aortic valve is tricuspid. Aortic valve regurgitation is trivial. 14. The pulmonic valve was grossly normal. Pulmonic valve regurgitation is not visualized. 15. TR signal is inadequate for assessing pulmonary artery systolic pressure. 16. The inferior vena cava is dilated in size with >50% respiratory variability, suggesting right atrial pressure of 8 mmHg.  Microbiology  Pleural fluid 12/28-no growth, pending Blood cultures 12/26-no growth, pending MRSA PCR-positive  Antimicrobials: Vancomycin 12/26 -- 12/28 Cefepime 12/26 --12/28 Doxycycline 12/28 with plans to finish on 12/31   Objective: Vitals:   09/09/19 2230 09/10/19 0555 09/10/19 0636 09/10/19 0735  BP: 110/62 110/70    Pulse:   (!) 55   Resp:   20   Temp:   97.8 F (36.6 C)   TempSrc:   Oral   SpO2:   100% 100%  Weight:      Height:        Intake/Output Summary (Last 24 hours) at 09/10/2019 1208 Last data filed at 09/10/2019 0935 Gross per 24 hour  Intake 620 ml  Output 150 ml  Net 470 ml   Filed Weights   09/05/19 1317 09/07/19 0400  Weight: (!) 157 kg (!) 168 kg    Examination:  Constitutional: Chronically ill-appearing morbidly obese male, no apparent significant distress Eyes: No scleral icterus seen ENMT: Moist mucous membranes Respiratory: Overall clear without wheezing or crackles but exam  difficult due to morbid obesity Cardiovascular: Irregular, bradycardic, no murmurs appreciated, anasarca, lower extremity Ace wrapped Abdomen: Soft, NT, ND, positive bowel sounds Musculoskeletal: no clubbing / cyanosis.  Neurologic: No focal deficits, overall weak but strength is equal.  Alert to self and place   Data Reviewed: I have independently reviewed following labs and imaging studies   CBC: Recent Labs  Lab 09/06/19 0500 09/07/19 0458 09/08/19 0527 09/09/19 0531 09/10/19 0151  WBC 9.0 9.9 11.0* 10.8* 11.4*  NEUTROABS 8.2* 9.1* 9.7* 8.7* 9.4*  HGB 9.8* 10.5* 10.1* 10.0* 9.8*  HCT 33.7* 34.8* 34.0* 32.7* 32.1*  MCV 92.6 90.2 89.2 89.8 90.7  PLT 303 342 289 241 469   Basic Metabolic Panel: Recent Labs  Lab 09/06/19 0500 09/07/19 0458 09/08/19 0527 09/08/19 1627 09/09/19 0531 09/10/19 0151  NA 125* 128* 124*  --  124* 123*  K 5.5* 5.6* 5.7* 5.6* 5.5* 5.3*  CL 88* 91* 89*  --  89* 87*  CO2 29 27 25   --  25 25  GLUCOSE 162* 162* 171*  --  200* 186*  BUN 40* 47* 51*  --  62* 65*  CREATININE 1.58* 1.77* 2.06*  --  2.39* 2.66*  CALCIUM 8.5* 8.6* 8.1*  --  7.9* 7.8*  MG 2.2  --   --   --  2.5*  --   PHOS 3.4  --   --   --   --   --    Liver Function Tests: Recent Labs  Lab 09/06/19 0500 09/07/19 0458 09/08/19 0527 09/09/19 0531 09/10/19 0151  AST 11* 12* 15 21 15   ALT 11 11 13 12 13   ALKPHOS 106 114 109 107 96  BILITOT 0.4 0.8 0.6 0.5 0.7  PROT 5.5* 5.7* 5.4* 5.5* 5.2*  ALBUMIN 2.0* 2.4* 2.2* 2.3* 2.5*   Coagulation Profile: Recent Labs  Lab 09/05/19 2202  INR 1.4*   HbA1C: No results for input(s): HGBA1C in the last 72 hours. CBG: Recent Labs  Lab 09/09/19 1636 09/09/19 2124 09/09/19 2316 09/10/19 0748 09/10/19 1149  GLUCAP 175* 202* 210* 170* 246*    Recent Results (from the past 240 hour(s))  Culture, blood (routine x 2)     Status: None (Preliminary result)   Collection Time: 09/05/19  8:29 PM   Specimen: BLOOD  Result Value Ref  Range Status   Specimen Description   Final    BLOOD LEFT HAND Performed at [facility], [place] [address], [place], [place] [address]    Special Requests   Final    BOTTLES DRAWN AEROBIC AND ANAEROBIC Blood Culture adequate volume Performed at [place] [address], [place], [place] [address]    Culture   Final    NO GROWTH 4 DAYS Performed at [facility], [place] [address], [place], [place] [address]    Report Status PENDING  Incomplete  Urine culture     Status: Abnormal   Collection Time: 09/05/19  8:29 PM   Specimen: Urine, Random  Result Value Ref Range Status   Specimen Description   Final    URINE, RANDOM Performed at [facility] [address] [place], [place] [address]    Special Requests   Final    NONE Performed at [facility], [place] [address] [place], [place] [address]    Culture MULTIPLE SPECIES PRESENT, SUGGEST RECOLLECTION (A)  Final   Report Status 09/07/2019 FINAL  Final  Culture, blood (routine x 2)     Status: None (Preliminary result)   Collection Time: 09/05/19  8:34 PM   Specimen: BLOOD  Result Value Ref Range Status   Specimen Description   Final    BLOOD RIGHT HAND Performed at [facility] [address], [place], [place] [address]    Special Requests   Final    BOTTLES DRAWN AEROBIC AND ANAEROBIC Blood Culture adequate volume Performed at [place] [address] [place], [place] [address]    Culture   Final    NO GROWTH 4 DAYS Performed at [facility], [place] [address] [place], [place] [address]    Report Status PENDING  Incomplete  Respiratory Panel by RT PCR (Flu A&B, Covid) - Nasopharyngeal Swab     Status: None   Collection Time: 09/06/19  8:10 AM   Specimen: Nasopharyngeal Swab  Result Value Ref Range Status   SARS Coronavirus 2 by RT PCR NEGATIVE NEGATIVE Final    Comment: (NOTE) SARS-CoV-2  target nucleic acids are NOT DETECTED. The SARS-CoV-2 RNA is generally detectable in upper respiratoy specimens during the acute phase of infection.  The lowest concentration of SARS-CoV-2 viral copies this assay can detect is 131 copies/mL. A negative result does not preclude SARS-Cov-2 infection and should not be used as the sole basis for treatment or other patient management decisions. A negative result may occur with  improper specimen collection/handling, submission of specimen other than nasopharyngeal swab, presence of viral mutation(s) within the areas targeted by this assay, and inadequate number of viral copies (<131 copies/mL). A negative result must be combined with clinical observations, patient history, and epidemiological information. The expected result is Negative. Fact Sheet for Patients:  PinkCheek.be Fact Sheet for Healthcare Providers:  GravelBags.it This test is not yet ap proved or cleared by the [place] FDA and  has been authorized for detection and/or diagnosis of SARS-CoV-2 by FDA under an Emergency Use Authorization (EUA). This EUA will remain  in effect (meaning this test can be used) for the duration of the COVID-19 declaration under Section 564(b)(1) of the Act, 21 U.S.C. section 360bbb-3(b)(1), unless the authorization is terminated or revoked sooner.    Influenza A by PCR NEGATIVE NEGATIVE Final   Influenza B by PCR NEGATIVE NEGATIVE Final    Comment: (NOTE) The Xpert Xpress SARS-CoV-2/FLU/RSV assay is intended as an aid in  the diagnosis of influenza from Nasopharyngeal swab specimens and  should not be used as a sole basis for treatment. Nasal washings and  aspirates are unacceptable for Xpert Xpress SARS-CoV-2/FLU/RSV  testing. Fact Sheet for Patients: PinkCheek.be Fact Sheet for Healthcare Providers: GravelBags.it This test is  not yet approved or cleared by the [place] FDA and  has been authorized for detection and/or diagnosis of SARS-CoV-2 by  FDA under an Emergency Use Authorization (EUA). This EUA will remain  in effect (meaning this test can be used) for the duration of the  Covid-19 declaration under Section 564(b)(1) of the Act, 21  U.S.C. section 360bbb-3(b)(1), unless the authorization is  terminated or revoked. Performed at [facility], [place] [address], [place], [place] [address]   Body fluid culture (includes gram stain)     Status: None (Preliminary result)   Collection Time: 09/07/19 11:51 AM   Specimen: Pleural Fluid  Result Value Ref Range Status   Specimen Description   Final    PLEURAL Performed at [place] [address] [place], [place] [address]    Special Requests   Final    PLEURAL Performed at [facility], [place] [address] [place], [place] [address]    Gram Stain NO WBC SEEN NO ORGANISMS SEEN   Final   Culture   Final    NO GROWTH 3 DAYS Performed at [facility], [place] [address], [place], [place] [address]    Report Status PENDING  Incomplete  MRSA PCR Screening     Status: Abnormal   Collection Time: 09/07/19  2:22 PM   Specimen: Nasopharyngeal  Result Value Ref Range Status   MRSA by PCR POSITIVE (A) NEGATIVE Final    Comment:        The GeneXpert MRSA Assay (FDA approved for NASAL specimens only), is one component of a comprehensive MRSA colonization surveillance program. It is not intended to diagnose MRSA infection nor to guide or monitor treatment for MRSA infections. RESULT CALLED TO, READ BACK BY AND VERIFIED WITHKathyrn Lass [id number] @ [address] Performed at [facility], [place] [address] [place], [place] [address]      Radiology Studies: No results found.  Domenic Polite, MD [facility]

## 2019-09-10 NOTE — Progress Notes (Signed)
RN notified M. Sharlet Salina, NP that the patient's Buttocks has small open wounds that are actively bleeding, RN placed ABD pads and soft tape on the buttocks.

## 2019-09-11 LAB — BASIC METABOLIC PANEL
Anion gap: 10 (ref 5–15)
BUN: 74 mg/dL — ABNORMAL HIGH (ref 8–23)
CO2: 25 mmol/L (ref 22–32)
Calcium: 7.8 mg/dL — ABNORMAL LOW (ref 8.9–10.3)
Chloride: 89 mmol/L — ABNORMAL LOW (ref 98–111)
Creatinine, Ser: 2.97 mg/dL — ABNORMAL HIGH (ref 0.61–1.24)
GFR calc Af Amer: 23 mL/min — ABNORMAL LOW (ref >60)
GFR calc non Af Amer: 20 mL/min — ABNORMAL LOW (ref >60)
Glucose, Bld: 206 mg/dL — ABNORMAL HIGH (ref 70–99)
Potassium: 5.3 mmol/L — ABNORMAL HIGH (ref 3.5–5.1)
Sodium: 124 mmol/L — ABNORMAL LOW (ref 135–145)

## 2019-09-11 LAB — CBC
HCT: 28.7 % — ABNORMAL LOW (ref 39.0–52.0)
Hemoglobin: 8.8 g/dL — ABNORMAL LOW (ref 13.0–17.0)
MCH: 27.4 pg (ref 26.0–34.0)
MCHC: 30.7 g/dL (ref 30.0–36.0)
MCV: 89.4 fL (ref 80.0–100.0)
Platelets: 208 10*3/uL (ref 150–400)
RBC: 3.21 MIL/uL — ABNORMAL LOW (ref 4.22–5.81)
RDW: 16.7 % — ABNORMAL HIGH (ref 11.5–15.5)
WBC: 10.5 10*3/uL (ref 4.0–10.5)
nRBC: 0.6 % — ABNORMAL HIGH (ref 0.0–0.2)

## 2019-09-11 LAB — CULTURE, BLOOD (ROUTINE X 2)
Culture: NO GROWTH
Culture: NO GROWTH
Special Requests: ADEQUATE
Special Requests: ADEQUATE

## 2019-09-11 LAB — BODY FLUID CULTURE
Culture: NO GROWTH
Gram Stain: NONE SEEN

## 2019-09-11 LAB — GLUCOSE, CAPILLARY
Glucose-Capillary: 172 mg/dL — ABNORMAL HIGH (ref 70–99)
Glucose-Capillary: 171 mg/dL — ABNORMAL HIGH (ref 70–99)
Glucose-Capillary: 213 mg/dL — ABNORMAL HIGH (ref 70–99)

## 2019-09-11 MED ORDER — FENTANYL CITRATE (PF) 100 MCG/2ML IJ SOLN
25.0000 ug | INTRAMUSCULAR | Status: DC | PRN
  Administered 2019-09-11 – 2019-09-16 (×11): 25 ug via INTRAVENOUS
  Filled 2019-09-11 (×12): qty 2

## 2019-09-11 NOTE — Progress Notes (Signed)
[facility] Progress Note  Subjective: no c/o, 100 cc UOP, creat up mid 2's  Vitals:   09/11/19 0536 09/11/19 0859 09/11/19 1029 09/11/19 1418  BP: 125/64  (!) 88/64 102/70  Pulse: (!) 59   (!) 56  Resp: (!) 22   20  Temp: 97.7 F (36.5 C)   98.2 F (36.8 C)  TempSrc: Oral   Oral  SpO2: 100% 99%  100%  Weight:      Height:        Exam: Gen awakens to voice and falls asleep during conversation, Ox 3 w/ prodding, mild myoclonic jerking Mild JVD Chest mostly clear, occ rales post RRR no MRG Abd soft ntnd no mass or ascites +bs markedly obese GU normal male w/ foley draining small amts dark brown urine MS no joint effusions or deformity Ext marked 3-4+ edema of LE's up to hips/ scrotum and abd wall Neuro is lethargic, mild invol myoclonic jerking    Home meds:  - amlodipine 5/ metoprolol xl 25   - trazodone 150/ ropinirole 0.5/ oxy tid prn/ gabapentin 300 tid/ escitalopram 20 qd  - crestor 5 / sl ntg prn/ ezetimibe 10/ aspirin 81  - rivaroxaban 10 qd  - prednisone 10 qd/ levothryoxine 25 ug/ duoneb qid prn  - prn's/ vitamins/ supplements   CT chest 12/26 >  IMPRESSION:  1. Mod-large L effusion w/ passive atx 2. Small R effusion 3. Ground glass / septal thickening c/w pulm edema 4.  Moderate pericard effusion 5. Constellation of findings c/w CHF   CXR 12/30 - L sided opacities, R side clear     Assessment/ Plan: 1. AKI - baseline creat 1.2 in 2019. Creat 1.5 on admission here, was given IV lasix 40-80 mg x 48hrs and creat increased so lasix dc'd.  Did not respond well to the IV lasix and UOP has been < 300 cc/d since admission. Pt has severe anasarca but also pulm edema per the chest CT. EF is lower than prior at 45%. Cardiorenal. Pt has AKI now, creat continues to rise. Not HD candidate. Per primary plan is going to be to move towards comfort care. Will sign off.  2. CHF - biventricular w/ pulm edema by CT and anasarca 3. Morbid  obesity    Rob Zaiyah Sottile 09/11/2019, 5:10 PM  Inpatient medications: . amLODipine  5 mg Oral Daily  . apixaban  2.5 mg Oral BID  . arformoterol  15 mcg Nebulization BID  . vitamin C  500 mg Oral BID  . aspirin EC  81 mg Oral Daily  . budesonide (PULMICORT) nebulizer solution  0.25 mg Nebulization BID  . Chlorhexidine Gluconate Cloth  6 each Topical Daily  . Chlorhexidine Gluconate Cloth  6 each Topical Q0600  . docusate sodium  100 mg Oral BID  . escitalopram  20 mg Oral Daily  . ezetimibe  10 mg Oral Daily  . feeding supplement (PRO-STAT SUGAR FREE 64)  30 mL Oral TID BM  . ferrous sulfate  325 mg Oral BID WC  . folic acid  1 mg Oral Daily  . gabapentin  300 mg Oral QHS  . Gerhardt's butt cream   Topical BID  . hydrocerin   Topical BID  . insulin aspart  0-5 Units Subcutaneous QHS  . insulin aspart  0-9 Units Subcutaneous TID WC  . insulin glargine  10 Units Subcutaneous Daily  . levothyroxine  25 mcg Oral QAC breakfast  . magnesium oxide  400 mg Oral Daily  .  multivitamin with minerals  1 tablet Oral Daily  . mupirocin ointment  1 application Nasal BID  . mupirocin ointment   Topical Daily  . nutrition supplement (JUVEN)  1 packet Oral BID BM  . pantoprazole  40 mg Oral Daily  . predniSONE  10 mg Oral Q breakfast  . rOPINIRole  0.5 mg Oral QHS  . rosuvastatin  5 mg Oral Daily   . furosemide     fentaNYL (SUBLIMAZE) injection, guaiFENesin, ipratropium-albuterol, nitroGLYCERIN, oxyCODONE

## 2019-09-11 NOTE — Progress Notes (Signed)
Palliative care progress note  Reason for visit: goals of care in light of worsening renal failure  Overnight events, discussed with Dr. Joseph and bedside RN.  I met today with [name].  He was awake and alert but seemed less able to follow conversation today.    I called to see if I could reach his brother, [name], to be part of conversation.  He was unavailable, but his sister in law, [name], joined us via phone.  I reviewed with [name] regarding clinical course this admission including multiple comorbidities including cardiac, respiratory, and renal systems.  Discussed specifically about his renal failure and that there is real possibility that his time is short regardless of interventions due to his kidney failure.  We discussed difference between a aggressive medical intervention path and a palliative, comfort focused care path.    Questions and concerns addressed.   PMT will follow up tomorrow.  - DNR/DNI - Discussed with [name] and his sister in law via phone regarding renal failure and likelihood that this will be terminal regardless of interventions.  - I am still not sure how much he is really processing information that is given to him.   - He was very clear in expressing appreciation for care received thus far. - His brother was unavailable to discuss today. Plan for f/u meeting tomorrow at 10AM.  Total time: 40 minutes Greater than 50%  of this time was spent counseling and coordinating care related to the above assessment and plan.   , MD [employer] Palliative Medicine Team [phone]  

## 2019-09-11 NOTE — Progress Notes (Signed)
Compression wraps on legs changed and Eucerin cream applied per order. Buttocks cleansed and Gerhardtts applied. Interdry ordered and applied between abdominal folds and in creases between legs and perineum. Pt increasingly confused and agitated this afternoon, also with new complaints of some generalized pain. Still with minimal urine output. Will continue to monitor patient.

## 2019-09-11 NOTE — Progress Notes (Signed)
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  Per notes he apparently required 8 L to maintain sats above 90%.  CT scan on admission showed bilateral pleural effusions left greater than right, pulmonary edema, cardiomegaly with pericardial effusion.  He was Covid negative.  He was started on diuretics, admitted also underwent thoracentesis, fluid was transudative.  2D echo done shortly after admission showed newly depressed EF of about 40% with left apical wall motion abnormality, and cardiology was consulted.  He was given diuresis for couple days however his creatinine continued to get worse, urine output remains very poor, oliguric, Lasix was stopped but despite that his kidney failure persisted and worsened.  Nephrology consulted on 12/30-NOT HD candidate, recommended comfort care.  Palliative also following  Subjective / 24h Interval events: -some confusion overnight, has poor recollection of discussions from yesterday -po intake if fair, denies dyspnea but becomes tachypneic with conversation  Assessment & Plan:  Acute on chronic hypoxic respiratory failure  Bilateral pleural effusions Moderate pericardial effusion Acute on chronic systolic and diastolic CHF Cardiorenal syndrome, right greater than left heart failure AKI/cardiorenal syndrome -Acute on chronic systolic, diastolic CHF, complicated by progressive AKI and oliguria  -Patient had bilateral pleural effusions left greater than right, status post  thoracentesis on the left, with transudate of fluid, cultures negative, also moderate pericardial effusion -And anasarca -2D echocardiogram with EF of 40 to 45% with left apical hypokinesis, cardiology following -Poor response to diuretics, subsequently held due to worsening contraction alkalosis, now with worsening urine output and oliguria -In addition has hypoalbuminemia which is also contributing to his third spacing -Nephrology consulted, they felt the patient is not a dialysis candidate and recommended comfort focused care, discussed with Dr. Jonnie Finner today, his recommendations are the same, we also talked about attempting single high-dose Lasix x1  -Fortunately blood pressure in the 80s (confirmed manual )yesterday afternoon and again this morning, overnight with some readings that were high but doubt these were accurate -I called and discussed with patient's brother yesterday( only immediate family Risk manager) who lives in [place], brother relays ongoing decline and very poor quality of life, brother agrees with No Dialysis and focusing on Comfort focused care   Acute kidney injury -His creatinine was normal in 2019, with no recent baseline system, now admitted with anasarca, third spacing with pleural effusions, extensive edema up to his upper thigh, moderate pericardial effusion -Hypoalbuminemia as well -Creatinine continues to worsen, diuresed with IV Lasix initially with very poor response, subsequently diuretics were held, urine output remains very poor, oliguric for the last 3 days -Suspect Cardiorenal syndrome -Nephrology consulting, not felt to be a dialysis candidate, recommended comfort focused care   Pneumonia  -Clinically doubt this, suspect all his symptoms were from CHF and pleural effusion only, Covid PCR was negative, did receive broad-spectrum antibiotics IV initially and subsequently oral doxycycline and has completed 5-day course of antibiotics now discontinued  OSA  -Continue nightly CPAP  Acute metabolic encephalopathy -Patient was altered at presentation likely because of hypercapnia. -Overall he is more alert but still  remains intermittently confused  Hyperkalemia -Due to worsening AKI, hyperkalemia is persistent despite daily Lokelma  Type 2 diabetes mellitus with peripheral neuropathy, with hyperglycemia -A1c 7.7.  Continue insulin  History of pulmonary embolism -We will change to renal dose Eliquis  H/o bullous pemphigoid -Home meds include prednisone 10 mg daily, methotrexate.   -Was placed briefly on IV Decadron given concern for Covid, discontinued that and placed back on home prednisone  Neurogenic bladder Indwelling Foley catheter History of ESBL -No urinary symptoms in this admission.  Urine cultures without significant growth  Morbid obesity  -BMI of 47  Chronic ambulatory dysfunction Chronic pain Restless leg syndrome Anxiety/depression -On tramadol, oxycodone as needed. -Continue Lexapro, Requip, discontinue trazodone  Hypothyroidism -Continue Synthroid was normal on 12/28 at 3.4  GERD/chronic anemia -Continue ferrous sulfate, Protonix, -Hemoglobin stable.  Sacraldecubitus ulcer - POA -nursing to do wound care and consider wound care consultation   DVT prophylaxis: change to renal dose Eliquis Code Status: DNR Family Communication: No family at bedside, called and had an extensive discussion with brother [name] 12/31 Disposition Plan: to be determined, may need residential Hospice   Pressure Injury 09/06/19 Toe (Comment  which one) Right Stage 2 -  Partial thickness loss of dermis presenting as a shallow open injury with a red, pink wound bed without slough. (Active)  09/06/19 1804  Location: Toe (Comment  which one) (2nd toe )  Location Orientation: Right  Staging: Stage 2 -  Partial thickness loss of dermis presenting as a shallow open injury with a red, pink wound bed without slough.  Wound  Description (Comments):   Present on Admission: Yes     Pressure Injury 09/06/19 Toe (Comment  which one) Anterior;Left Unstageable - Full thickness tissue loss in which the base of the injury is covered by slough (yellow, tan, gray, green or brown) and/or eschar (tan, brown or black) in the wound bed. (Active)  09/06/19 1806  Location: Toe (Comment  which one) (2nd toe)  Location Orientation: Anterior;Left  Staging: Unstageable - Full thickness tissue loss in which the base of the injury is covered by slough (yellow, tan, gray, green or brown) and/or eschar (tan, brown or black) in the wound bed.  Wound Description (Comments):   Present on Admission: Yes   Scheduled Meds: . amLODipine  5 mg Oral Daily  . apixaban  2.5 mg Oral BID  . arformoterol  15 mcg Nebulization BID  . vitamin C  500 mg Oral BID  . aspirin EC  81 mg Oral Daily  . budesonide (PULMICORT) nebulizer solution  0.25 mg Nebulization BID  . Chlorhexidine Gluconate Cloth  6 each Topical Daily  . Chlorhexidine Gluconate Cloth  6 each Topical Q0600  . docusate sodium  100 mg Oral BID  . escitalopram  20 mg Oral Daily  . ezetimibe  10 mg Oral Daily  . feeding supplement (PRO-STAT SUGAR FREE 64)  30 mL Oral TID BM  . ferrous sulfate  325 mg Oral BID WC  . folic acid  1 mg Oral Daily  . gabapentin  300 mg Oral QHS  . Gerhardt's butt cream   Topical BID  . hydrocerin   Topical BID  . insulin aspart  0-5 Units Subcutaneous QHS  . insulin aspart  0-9 Units Subcutaneous TID WC  . insulin glargine  10 Units Subcutaneous Daily  . levothyroxine  25 mcg Oral QAC breakfast  . magnesium oxide  400 mg Oral Daily  . multivitamin with minerals  1 tablet Oral Daily  . mupirocin ointment  1 application Nasal BID  . mupirocin ointment   Topical Daily  . nutrition supplement (JUVEN)  1 packet Oral BID BM  . pantoprazole  40 mg Oral Daily  . predniSONE  10 mg Oral Q breakfast  . rOPINIRole  0.5 mg Oral QHS  . rosuvastatin  5 mg Oral  Daily   Continuous Infusions: . furosemide     PRN Meds:.guaiFENesin, ipratropium-albuterol, nitroGLYCERIN, oxyCODONE  Consultants:  Cardiology   Procedures:  2D echo:  IMPRESSIONS    1. Left ventricular ejection fraction, by visual estimation, is 40 to 45%. The left ventricle has mild to moderately decreased function. There is no left ventricular hypertrophy.  2. Moderate hypokinesis of the left ventricular, entire apical segment.  3. Left ventricular diastolic function could not be evaluated.  4. The left ventricle demonstrates regional wall motion abnormalities.  5. Global right ventricle has normal systolic function.The right ventricular size is normal. No increase in right ventricular wall thickness.  6. Left atrial size was moderately dilated.  7. Right atrial size was normal.  8. Moderate pericardial effusion.  9. The pericardial effusion is circumferential. 10. Moderate mitral annular calcification. 11. The mitral valve is normal in structure. Trivial mitral valve regurgitation. 12. The tricuspid valve is normal in structure. 13. The aortic valve is tricuspid. Aortic valve regurgitation is trivial. 14. The pulmonic valve was grossly normal. Pulmonic valve regurgitation is not visualized. 15. TR signal is inadequate for assessing pulmonary artery systolic pressure. 16. The inferior vena cava is dilated in size with >50% respiratory variability, suggesting right atrial pressure of 8 mmHg.  Microbiology  Pleural fluid 12/28-no growth, pending Blood cultures 12/26-no growth, pending MRSA PCR-positive  Antimicrobials: Vancomycin 12/26 -- 12/28 Cefepime 12/26 --12/28 Doxycycline 12/28 with plans to finish on 12/31   Objective: Vitals:   09/11/19 0536 09/11/19 0859 09/11/19 1029 09/11/19 1418  BP: 125/64  (!) 88/64 102/70  Pulse: (!) 59   (!) 56  Resp: (!) 22   20  Temp: 97.7 F (36.5 C)   98.2 F (36.8 C)  TempSrc: Oral   Oral  SpO2: 100% 99%  100%  Weight:       Height:        Intake/Output Summary (Last 24 hours) at 09/11/2019 1550 Last data filed at 09/11/2019 0600 Gross per 24 hour  Intake 300 ml  Output 240 ml  Net 60 ml   Filed Weights   09/05/19 1317 09/07/19 0400 09/11/19 0500  Weight: (!) 157 kg (!) 168 kg (!) 218 kg    Examination:  Constitutional: Chronically ill-appearing morbidly obese male, no apparent significant distress Eyes: No scleral icterus seen ENMT: Moist mucous membranes Respiratory: Overall clear without wheezing or crackles but exam difficult due to morbid obesity Cardiovascular: Irregular, bradycardic, no murmurs appreciated, anasarca, lower extremity Ace wrapped Abdomen: Soft, NT, ND, positive bowel sounds Musculoskeletal: no clubbing / cyanosis.  Neurologic: No focal deficits, overall weak but strength is equal.  Alert to self and place   Data Reviewed: I have independently reviewed following labs and imaging studies   CBC: Recent Labs  Lab 09/06/19 0500 09/07/19 0458 09/08/19 0527 09/09/19 0531 09/10/19 0151 09/11/19 0426  WBC 9.0 9.9 11.0* 10.8* 11.4* 10.5  NEUTROABS 8.2* 9.1* 9.7* 8.7* 9.4*  --   HGB 9.8* 10.5* 10.1* 10.0* 9.8* 8.8*  HCT 33.7* 34.8* 34.0* 32.7* 32.1* 28.7*  MCV 92.6 90.2 89.2 89.8 90.7 89.4  PLT 303 342 289  241 223 923   Basic Metabolic Panel: Recent Labs  Lab 09/06/19 0500 09/07/19 0458 09/08/19 0527 09/08/19 1627 09/09/19 0531 09/10/19 0151 09/11/19 0426  NA 125* 128* 124*  --  124* 123* 124*  K 5.5* 5.6* 5.7* 5.6* 5.5* 5.3* 5.3*  CL 88* 91* 89*  --  89* 87* 89*  CO2 29 27 25   --  25 25 25   GLUCOSE 162* 162* 171*  --  200* 186* 206*  BUN 40* 47* 51*  --  62* 65* 74*  CREATININE 1.58* 1.77* 2.06*  --  2.39* 2.66* 2.97*  CALCIUM 8.5* 8.6* 8.1*  --  7.9* 7.8* 7.8*  MG 2.2  --   --   --  2.5*  --   --   PHOS 3.4  --   --   --   --   --   --    Liver Function Tests: Recent Labs  Lab 09/06/19 0500 09/07/19 0458 09/08/19 0527 09/09/19 0531 09/10/19 0151  AST  11* 12* 15 21 15   ALT 11 11 13 12 13   ALKPHOS 106 114 109 107 96  BILITOT 0.4 0.8 0.6 0.5 0.7  PROT 5.5* 5.7* 5.4* 5.5* 5.2*  ALBUMIN 2.0* 2.4* 2.2* 2.3* 2.5*   Coagulation Profile: Recent Labs  Lab 09/05/19 2202  INR 1.4*   HbA1C: No results for input(s): HGBA1C in the last 72 hours. CBG: Recent Labs  Lab 09/10/19 1149 09/10/19 1756 09/10/19 2115 09/11/19 0747 09/11/19 1146  GLUCAP 246* 191* 250* 172* 171*    Recent Results (from the past 240 hour(s))  Culture, blood (routine x 2)     Status: None   Collection Time: 09/05/19  8:29 PM   Specimen: BLOOD  Result Value Ref Range Status   Specimen Description   Final    BLOOD LEFT HAND Performed at [facility], [place] [address], [place], [place] [address]    Special Requests   Final    BOTTLES DRAWN AEROBIC AND ANAEROBIC Blood Culture adequate volume Performed at [place] [address] [place], [place] [address]    Culture   Final    NO GROWTH 5 DAYS Performed at [facility], [place] [address] [place], [place] [address]    Report Status 09/11/2019 FINAL  Final  Urine culture     Status: Abnormal   Collection Time: 09/05/19  8:29 PM   Specimen: Urine, Random  Result Value Ref Range Status   Specimen Description   Final    URINE, RANDOM Performed at [place] [address] [place], [place] [address]    Special Requests   Final    NONE Performed at [facility], [place] [address], [place], [place] [address]    Culture MULTIPLE SPECIES PRESENT, SUGGEST RECOLLECTION (A)  Final   Report Status 09/07/2019 FINAL  Final  Culture, blood (routine x 2)     Status: None   Collection Time: 09/05/19  8:34 PM   Specimen: BLOOD  Result Value Ref Range Status   Specimen Description   Final    BLOOD RIGHT HAND Performed at [place] [address] [place], [place] [address]    Special Requests   Final     BOTTLES DRAWN AEROBIC AND ANAEROBIC Blood Culture adequate volume Performed at [place] [address], [place], [place] [address]    Culture   Final    NO GROWTH 5 DAYS Performed  at [facility], [place] [address], [place], [place] [address]    Report Status 09/11/2019 FINAL  Final  Respiratory Panel by RT PCR (Flu A&B, Covid) - Nasopharyngeal Swab     Status: None   Collection Time: 09/06/19  8:10 AM   Specimen: Nasopharyngeal Swab  Result Value Ref Range Status   SARS Coronavirus 2 by RT PCR NEGATIVE NEGATIVE Final    Comment: (NOTE) SARS-CoV-2 target nucleic acids are NOT DETECTED. The SARS-CoV-2 RNA is generally detectable in upper respiratoy specimens during the acute phase of infection. The lowest concentration of SARS-CoV-2 viral copies this assay can detect is 131 copies/mL. A negative result does not preclude SARS-Cov-2 infection and should not be used as the sole basis for treatment or other patient management decisions. A negative result may occur with  improper specimen collection/handling, submission of specimen other than nasopharyngeal swab, presence of viral mutation(s) within the areas targeted by this assay, and inadequate number of viral copies (<131 copies/mL). A negative result must be combined with clinical observations, patient history, and epidemiological information. The expected result is Negative. Fact Sheet for Patients:  PinkCheek.be Fact Sheet for Healthcare Providers:  GravelBags.it This test is not yet ap proved or cleared by the [place] FDA and  has been authorized for detection and/or diagnosis of SARS-CoV-2 by FDA under an Emergency Use Authorization (EUA). This EUA will remain  in effect (meaning this test can be used) for the duration of the COVID-19 declaration under Section 564(b)(1) of the Act, 21 U.S.C. section 360bbb-3(b)(1), unless the authorization  is terminated or revoked sooner.    Influenza A by PCR NEGATIVE NEGATIVE Final   Influenza B by PCR NEGATIVE NEGATIVE Final    Comment: (NOTE) The Xpert Xpress SARS-CoV-2/FLU/RSV assay is intended as an aid in  the diagnosis of influenza from Nasopharyngeal swab specimens and  should not be used as a sole basis for treatment. Nasal washings and  aspirates are unacceptable for Xpert Xpress SARS-CoV-2/FLU/RSV  testing. Fact Sheet for Patients: PinkCheek.be Fact Sheet for Healthcare Providers: GravelBags.it This test is not yet approved or cleared by the [place] FDA and  has been authorized for detection and/or diagnosis of SARS-CoV-2 by  FDA under an Emergency Use Authorization (EUA). This EUA will remain  in effect (meaning this test can be used) for the duration of the  Covid-19 declaration under Section 564(b)(1) of the Act, 21  U.S.C. section 360bbb-3(b)(1), unless the authorization is  terminated or revoked. Performed at [facility], [place] [address] [place], [place] [address]   Body fluid culture (includes gram stain)     Status: None   Collection Time: 09/07/19 11:51 AM   Specimen: Pleural Fluid  Result Value Ref Range Status   Specimen Description   Final    PLEURAL Performed at [place] [address] [place], [place] [address]    Special Requests   Final    PLEURAL Performed at [facility], [place] [address], [place], [place] [address]    Gram Stain NO WBC SEEN NO ORGANISMS SEEN   Final   Culture   Final    NO GROWTH 3 DAYS Performed at [facility], [place] [address], [place], [place] [address]    Report Status 09/11/2019 FINAL  Final  MRSA PCR Screening     Status: Abnormal   Collection Time: 09/07/19  2:22 PM   Specimen: Nasopharyngeal  Result Value Ref Range Status  MRSA by PCR POSITIVE (A) NEGATIVE Final    Comment:        The  GeneXpert MRSA Assay (FDA approved for NASAL specimens only), is one component of a comprehensive MRSA colonization surveillance program. It is not intended to diagnose MRSA infection nor to guide or monitor treatment for MRSA infections. RESULT CALLED TO, READ BACK BY AND VERIFIED WITHKathyrn Lass [id number] @ [phone] Performed at [facility], [place] [address] [place], [place] [address]      Radiology Studies: No results found.   Domenic Polite, MD [facility]

## 2019-09-12 LAB — GLUCOSE, CAPILLARY
Glucose-Capillary: 200 mg/dL — ABNORMAL HIGH (ref 70–99)
Glucose-Capillary: 210 mg/dL — ABNORMAL HIGH (ref 70–99)
Glucose-Capillary: 199 mg/dL — ABNORMAL HIGH (ref 70–99)

## 2019-09-12 LAB — BASIC METABOLIC PANEL
Anion gap: 12 (ref 5–15)
BUN: 87 mg/dL — ABNORMAL HIGH (ref 8–23)
CO2: 25 mmol/L (ref 22–32)
Calcium: 7.7 mg/dL — ABNORMAL LOW (ref 8.9–10.3)
Chloride: 85 mmol/L — ABNORMAL LOW (ref 98–111)
Creatinine, Ser: 3.12 mg/dL — ABNORMAL HIGH (ref 0.61–1.24)
GFR calc Af Amer: 22 mL/min — ABNORMAL LOW (ref >60)
GFR calc non Af Amer: 19 mL/min — ABNORMAL LOW (ref >60)
Glucose, Bld: 169 mg/dL — ABNORMAL HIGH (ref 70–99)
Potassium: 5.2 mmol/L — ABNORMAL HIGH (ref 3.5–5.1)
Sodium: 122 mmol/L — ABNORMAL LOW (ref 135–145)

## 2019-09-12 LAB — CBC
HCT: 29.4 % — ABNORMAL LOW (ref 39.0–52.0)
Hemoglobin: 9.1 g/dL — ABNORMAL LOW (ref 13.0–17.0)
MCH: 27.3 pg (ref 26.0–34.0)
MCHC: 31 g/dL (ref 30.0–36.0)
MCV: 88.3 fL (ref 80.0–100.0)
Platelets: 184 10*3/uL (ref 150–400)
RBC: 3.33 MIL/uL — ABNORMAL LOW (ref 4.22–5.81)
RDW: 17.2 % — ABNORMAL HIGH (ref 11.5–15.5)
WBC: 11.2 10*3/uL — ABNORMAL HIGH (ref 4.0–10.5)
nRBC: 0.4 % — ABNORMAL HIGH (ref 0.0–0.2)

## 2019-09-12 LAB — CREATININE, SERUM
Creatinine, Ser: 3.11 mg/dL — ABNORMAL HIGH (ref 0.61–1.24)
GFR calc Af Amer: 22 mL/min — ABNORMAL LOW (ref >60)
GFR calc non Af Amer: 19 mL/min — ABNORMAL LOW (ref >60)

## 2019-09-12 NOTE — Progress Notes (Signed)
Palliative care progress note  Reason for visit: goals of care in light of worsening renal failure  Chart reviewed.  Discussed with Dr. [name].  I met today with [name].  He was sleepy at time of my encounter.  He did arouse for me, but he was not as able to follow conversation and shortly thereafter fell back to sleep.  I called and was able to reach [name].  We reviewed the fact that he seems to be having some increasing confusion when he speaks with his brother on the phone.  Also discussed clinical course this admission including multiple comorbidities including cardiac, respiratory, and renal systems.  [name] and [name] understand that with his renal failure it is likely a matter of time until he continues to become more confused and somnolent.  [name] feels that continuing to ask his brother over and over and remind him of the fact that he is going to die is not serving him well when he cannot recall the information later nor process it in an adequate way to make decisions about his care.  We discussed difference between a aggressive medical intervention path and a palliative, comfort focused care path.    [name] understands his condition and we talked about options for care moving forward.  This included continuation of current interventions for short period of time with the likelihood being that he will continue to decline and have worsening mental status.  Discussed that we will likely reach a point in a short period of time where we are looking at end of life regardless of interventions and discussed possibility of transition to residential hospice for end-of-life care.  [name] and [name] state that they feel this is likely best pathway forward, and they are in agreement with continuing to assess his condition with consideration that we may be reaching a point where transition to residential hospice would be most appropriate step moving forward    Questions and concerns addressed.   PMT will follow up tomorrow.  - DNR/DNI - Discussed with his brother and sister in law via phone regarding renal failure and likelihood that this will be terminal regardless of interventions.  - [name]  His brother feels this is also the case and requests that we not continue to have same conversation with him about the fact that he is going to die as he does not remember and gets upset each time that it occurs. -We will likely be transitioning to more of a comfort focus over the next few days.  Consideration for transition to residential hospice as he continues to decline.  Total time: 40 minutes Greater than 50%  of this time was spent counseling and coordinating care related to the above assessment and plan.  Micheline Rough, MD [place] [facility] [phone]

## 2019-09-12 NOTE — Progress Notes (Signed)
Call received from CMT regarding 12 beats wide QRS. Primary RN contacted, information reported. Primary RN  to assesses patient.

## 2019-09-12 NOTE — Progress Notes (Signed)
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  Per ED notes he apparently required 8 L to maintain sats above 90%.  CT scan on admission showed bilateral pleural effusions left greater than right, pulmonary edema, cardiomegaly with pericardial effusion.  He was Covid negative.  He was started on diuretics, admitted also underwent thoracentesis, fluid was transudative.  2D echo done shortly after admission showed newly depressed EF of about 40% with left apical wall motion abnormality, and cardiology was consulted.  He was given diuresis for couple days however his creatinine continued to get worse, urine output remains very poor, oliguric, Lasix was held but his kidney patient worsened and remained oliguric.  Nephrology consulted on 12/30-NOT HD candidate, recommended comfort care.  Palliative also following -d/w brother who lives in [place], he is agreeable to [facility] and understands that pt is not an HD candidate  Subjective / 24h Interval events: -Denies any new complaints this morning, oral intake is fair, has poor recollection of discussions from previous days, did have some overnight mild confusion  Assessment & Plan:  Acute on chronic hypoxic respiratory failure  Bilateral pleural effusions Moderate pericardial effusion Acute on chronic systolic and diastolic CHF Cardiorenal syndrome, right greater than left heart failure AKI/cardiorenal syndrome -Acute on chronic systolic,  diastolic CHF, right heart failure, complicated by progressive AKI and oliguria  -Patient had bilateral pleural effusions left greater than right, status post thoracentesis on the left, with transudate of fluid, cultures negative, also moderate pericardial effusion -And anasarca -2D echocardiogram with EF of 40 to 45% with left apical hypokinesis, cardiology consulted -Poor response to diuretics, subsequently held due to worsening contraction alkalosis, now with worsening urine output and oliguria -In addition has hypoalbuminemia which is also contributing to his third spacing -Nephrology consulted, they felt the patient is not a dialysis candidate and recommended comfort focused care, discussed with Dr. Jonnie Wood again 12/31, his recommendations are the same, we also talked about attempting single high-dose Lasix x1, unfortunately his blood pressure has been in the mid 80s, hence did not get this -I called and discussed with patient's brother 12/31 ( only immediate family [name]) who lives in [place], brother relays ongoing decline and very poor quality of life, brother agrees with No Dialysis and focusing on Comfort focused care -Palliative consulted, [name] following and had discussions with family, we are now considering [facility] in a few days   Acute kidney injury -His creatinine was normal in 2019, with no recent baseline system, now admitted with anasarca, third spacing with pleural effusions, extensive edema up to his upper thigh, moderate pericardial effusion -Hypoalbuminemia as well -Creatinine continues to worsen, diuresed with IV Lasix initially with very poor response, subsequently diuretics were held, urine output remains very poor, oliguric for the last 4 days -Cardiorenal syndrome suspected -Nephrology consulting, not felt to be a dialysis candidate, recommended comfort focused care   Pneumonia  -Clinically doubt this, suspect all his symptoms were from CHF and  pleural effusion only, Covid PCR was negative, did receive broad-spectrum antibiotics IV initially  and subsequently oral doxycycline and has completed 5-day course of antibiotics now discontinued  OSA -Continue nightly CPAP  Acute metabolic encephalopathy -Patient was altered at presentation likely because of hypercapnia. -Overall he is more alert but still remains intermittently confused  Hyperkalemia -Due to worsening AKI, hyperkalemia is persistent on daily Lokelma   Type 2 diabetes mellitus with peripheral neuropathy, with hyperglycemia -A1c 7.7.  Continue sliding scale insulin  History of pulmonary embolism -Change to renal dose Eliquis  H/o bullous pemphigoid -Home meds include prednisone 10 mg daily, methotrexate.   -Was placed briefly on IV Decadron given concern for Covid, discontinued that and placed back on home prednisone  Neurogenic bladder Indwelling Foley catheter History of ESBL -No urinary symptoms in this admission.  Urine cultures without significant growth  Morbid obesity  -BMI of 47  Chronic ambulatory dysfunction Chronic pain Restless leg syndrome Anxiety/depression -On tramadol, oxycodone as needed. -Continue Lexapro, Requip, discontinue trazodone  Hypothyroidism -Continue Synthroid was normal on 12/28 at 3.4  GERD/chronic anemia -Continue ferrous sulfate, Protonix, -Hemoglobin stable.  Sacraldecubitus ulcer - POA  DVT prophylaxis: change to renal dose Eliquis Code Status: DNR Family Communication: No family at bedside, called and had an extensive discussion with brother [name] 12/31, palliative having discussions with brother now Disposition Plan: likely residential Hospice   Pressure Injury 09/06/19 Toe (Comment  which one) Right Stage 2 -  Partial thickness loss of dermis presenting as a shallow open injury with a red, pink wound bed without slough. (Active)  09/06/19 1804  Location: Toe (Comment  which one) (2nd toe )    Location Orientation: Right  Staging: Stage 2 -  Partial thickness loss of dermis presenting as a shallow open injury with a red, pink wound bed without slough.  Wound Description (Comments):   Present on Admission: Yes     Pressure Injury 09/06/19 Toe (Comment  which one) Anterior;Left Unstageable - Full thickness tissue loss in which the base of the injury is covered by slough (yellow, tan, gray, green or brown) and/or eschar (tan, brown or black) in the wound bed. (Active)  09/06/19 1806  Location: Toe (Comment  which one) (2nd toe)  Location Orientation: Anterior;Left  Staging: Unstageable - Full thickness tissue loss in which the base of the injury is covered by slough (yellow, tan, gray, green or brown) and/or eschar (tan, brown or black) in the wound bed.  Wound Description (Comments):   Present on Admission: Yes   Scheduled Meds: . amLODipine  5 mg Oral Daily  . apixaban  2.5 mg Oral BID  . arformoterol  15 mcg Nebulization BID  . vitamin C  500 mg Oral BID  . aspirin EC  81 mg Oral Daily  . budesonide (PULMICORT) nebulizer solution  0.25 mg Nebulization BID  . Chlorhexidine Gluconate Cloth  6 each Topical Daily  . Chlorhexidine Gluconate Cloth  6 each Topical Q0600  . docusate sodium  100 mg Oral BID  . escitalopram  20 mg Oral Daily  . ezetimibe  10 mg Oral Daily  . feeding supplement (PRO-STAT SUGAR FREE 64)  30 mL Oral TID BM  . ferrous sulfate  325 mg Oral BID WC  . folic acid  1 mg Oral Daily  . gabapentin  300 mg Oral QHS  . Gerhardt's butt cream   Topical BID  . hydrocerin   Topical BID  . insulin aspart  0-5 Units Subcutaneous QHS  . insulin aspart  0-9 Units Subcutaneous TID WC  .  insulin glargine  10 Units Subcutaneous Daily  . levothyroxine  25 mcg Oral QAC breakfast  . magnesium oxide  400 mg Oral Daily  . multivitamin with minerals  1 tablet Oral Daily  . mupirocin ointment   Topical Daily  . nutrition supplement (JUVEN)  1 packet Oral BID BM  .  pantoprazole  40 mg Oral Daily  . predniSONE  10 mg Oral Q breakfast  . rOPINIRole  0.5 mg Oral QHS  . rosuvastatin  5 mg Oral Daily   Continuous Infusions: . furosemide     PRN Meds:.fentaNYL (SUBLIMAZE) injection, guaiFENesin, ipratropium-albuterol, nitroGLYCERIN, oxyCODONE  Consultants:  Cardiology   Procedures:  2D echo:  IMPRESSIONS    1. Left ventricular ejection fraction, by visual estimation, is 40 to 45%. The left ventricle has mild to moderately decreased function. There is no left ventricular hypertrophy.  2. Moderate hypokinesis of the left ventricular, entire apical segment.  3. Left ventricular diastolic function could not be evaluated.  4. The left ventricle demonstrates regional wall motion abnormalities.  5. Global right ventricle has normal systolic function.The right ventricular size is normal. No increase in right ventricular wall thickness.  6. Left atrial size was moderately dilated.  7. Right atrial size was normal.  8. Moderate pericardial effusion.  9. The pericardial effusion is circumferential. 10. Moderate mitral annular calcification. 11. The mitral valve is normal in structure. Trivial mitral valve regurgitation. 12. The tricuspid valve is normal in structure. 13. The aortic valve is tricuspid. Aortic valve regurgitation is trivial. 14. The pulmonic valve was grossly normal. Pulmonic valve regurgitation is not visualized. 15. TR signal is inadequate for assessing pulmonary artery systolic pressure. 16. The inferior vena cava is dilated in size with >50% respiratory variability, suggesting right atrial pressure of 8 mmHg.  Microbiology  Pleural fluid 12/28-no growth, pending Blood cultures 12/26-no growth, pending MRSA PCR-positive  Antimicrobials: Vancomycin 12/26 -- 12/28 Cefepime 12/26 --12/28 Doxycycline 12/28 with plans to finish on 12/31   Objective: Vitals:   09/11/19 2146 09/12/19 0500 09/12/19 0628 09/12/19 0758  BP: 94/60   98/71   Pulse: (!) 58  100   Resp: 18  18   Temp: 97.9 F (36.6 C)  98 F (36.7 C)   TempSrc: Oral  Oral   SpO2: 97%  94% 99%  Weight:  (!) 215 kg    Height:        Intake/Output Summary (Last 24 hours) at 09/12/2019 1207 Last data filed at 09/12/2019 0700 Gross per 24 hour  Intake 0 ml  Output 375 ml  Net -375 ml   Filed Weights   09/07/19 0400 09/11/19 0500 09/12/19 0500  Weight: (!) 168 kg (!) 218 kg (!) 215 kg    Examination:  Gen: Morbidly obese, chronically ill male, somnolent but easily arousable, oriented to self and place only, some cognitive deficits noted HEENT: Neck obese, unable to assess JVD Lungs: Distant breath sounds CVS: S1-S2, regular rhythm Abd: Morbidly obese, soft, nontender, lower abdominal wall edema noted, bowel sounds present Extremities: 3+ edema, especially in the upper thigh, extending to lower abdomen  skin: no new rashes Neurologic: No focal deficits, overall weak but strength is equal.  Alert to self and place   Data Reviewed: I have independently reviewed following labs and imaging studies   CBC: Recent Labs  Lab 09/06/19 0500 09/07/19 0458 09/08/19 0527 09/09/19 0531 09/10/19 0151 09/11/19 0426 09/12/19 0441  WBC 9.0 9.9 11.0* 10.8* 11.4* 10.5 11.2*  NEUTROABS 8.2* 9.1*  9.7* 8.7* 9.4*  --   --   HGB 9.8* 10.5* 10.1* 10.0* 9.8* 8.8* 9.1*  HCT 33.7* 34.8* 34.0* 32.7* 32.1* 28.7* 29.4*  MCV 92.6 90.2 89.2 89.8 90.7 89.4 88.3  PLT 303 342 289 241 223 208 767   Basic Metabolic Panel: Recent Labs  Lab 09/06/19 0500 09/08/19 0527 09/08/19 1627 09/09/19 0531 09/10/19 0151 09/11/19 0426 09/12/19 0441  NA 125* 124*  --  124* 123* 124* 122*  K 5.5* 5.7* 5.6* 5.5* 5.3* 5.3* 5.2*  CL 88* 89*  --  89* 87* 89* 85*  CO2 29 25  --  25 25 25 25   GLUCOSE 162* 171*  --  200* 186* 206* 169*  BUN 40* 51*  --  62* 65* 74* 87*  CREATININE 1.58* 2.06*  --  2.39* 2.66* 2.97* 3.12*  3.11*  CALCIUM 8.5* 8.1*  --  7.9* 7.8* 7.8* 7.7*  MG 2.2   --   --  2.5*  --   --   --   PHOS 3.4  --   --   --   --   --   --    Liver Function Tests: Recent Labs  Lab 09/06/19 0500 09/07/19 0458 09/08/19 0527 09/09/19 0531 09/10/19 0151  AST 11* 12* 15 21 15   ALT 11 11 13 12 13   ALKPHOS 106 114 109 107 96  BILITOT 0.4 0.8 0.6 0.5 0.7  PROT 5.5* 5.7* 5.4* 5.5* 5.2*  ALBUMIN 2.0* 2.4* 2.2* 2.3* 2.5*   Coagulation Profile: Recent Labs  Lab 09/05/19 2202  INR 1.4*   HbA1C: No results for input(s): HGBA1C in the last 72 hours. CBG: Recent Labs  Lab 09/10/19 2115 09/11/19 0747 09/11/19 1146 09/11/19 1809 09/12/19 1137  GLUCAP 250* 172* 171* 213* 200*    Recent Results (from the past 240 hour(s))  Culture, blood (routine x 2)     Status: None   Collection Time: 09/05/19  8:29 PM   Specimen: BLOOD  Result Value Ref Range Status   Specimen Description   Final    BLOOD LEFT HAND Performed at [facility], [place] [address], [place], [place] [address]    Special Requests   Final    BOTTLES DRAWN AEROBIC AND ANAEROBIC Blood Culture adequate volume Performed at [place] [address] [place], [place] [address]    Culture   Final    NO GROWTH 5 DAYS Performed at [facility], [place] [address], [place], [place] [address]    Report Status 09/11/2019 FINAL  Final  Urine culture     Status: Abnormal   Collection Time: 09/05/19  8:29 PM   Specimen: Urine, Random  Result Value Ref Range Status   Specimen Description   Final    URINE, RANDOM Performed at [place] [address] [place], [place] [address]    Special Requests   Final    NONE Performed at [facility], [place] [address] [place], [place] [address]    Culture MULTIPLE SPECIES PRESENT, SUGGEST RECOLLECTION (A)  Final   Report Status 09/07/2019 FINAL  Final  Culture, blood (routine x 2)     Status: None   Collection Time: 09/05/19  8:34 PM   Specimen: BLOOD  Result Value Ref  Range Status   Specimen Description   Final    BLOOD RIGHT HAND Performed at [place] [address] [place], [place] [address]    Special  Requests   Final    BOTTLES DRAWN AEROBIC AND ANAEROBIC Blood Culture adequate volume Performed at [place] [address] [place], [place] [address]    Culture   Final    NO GROWTH 5 DAYS Performed at [facility], [place] [address] [place] [address] [place], [place] [address]    Report Status 09/11/2019 FINAL  Final  Respiratory Panel by RT PCR (Flu A&B, Covid) - Nasopharyngeal Swab     Status: None   Collection Time: 09/06/19  8:10 AM   Specimen: Nasopharyngeal Swab  Result Value Ref Range Status   SARS Coronavirus 2 by RT PCR NEGATIVE NEGATIVE Final    Comment: (NOTE) SARS-CoV-2 target nucleic acids are NOT DETECTED. The SARS-CoV-2 RNA is generally detectable in upper respiratoy specimens during the acute phase of infection. The lowest concentration of SARS-CoV-2 viral copies this assay can detect is 131 copies/mL. A negative result does not preclude SARS-Cov-2 infection and should not be used as the sole basis for treatment or other patient management decisions. A negative result may occur with  improper specimen collection/handling, submission of specimen other than nasopharyngeal swab, presence of viral mutation(s) within the areas targeted by this assay, and inadequate number of viral copies (<131 copies/mL). A negative result must be combined with clinical observations, patient history, and epidemiological information. The expected result is Negative. Fact Sheet for Patients:  PinkCheek.be Fact Sheet for Healthcare Providers:  GravelBags.it This test is not yet ap proved or cleared by the [place] FDA and  has been authorized for detection and/or diagnosis of SARS-CoV-2 by FDA under an Emergency Use Authorization (EUA). This EUA will  remain  in effect (meaning this test can be used) for the duration of the COVID-19 declaration under Section 564(b)(1) of the Act, 21 U.S.C. section 360bbb-3(b)(1), unless the authorization is terminated or revoked sooner.    Influenza A by PCR NEGATIVE NEGATIVE Final   Influenza B by PCR NEGATIVE NEGATIVE Final    Comment: (NOTE) The Xpert Xpress SARS-CoV-2/FLU/RSV assay is intended as an aid in  the diagnosis of influenza from Nasopharyngeal swab specimens and  should not be used as a sole basis for treatment. Nasal washings and  aspirates are unacceptable for Xpert Xpress SARS-CoV-2/FLU/RSV  testing. Fact Sheet for Patients: PinkCheek.be Fact Sheet for Healthcare Providers: GravelBags.it This test is not yet approved or cleared by the [place] FDA and  has been authorized for detection and/or diagnosis of SARS-CoV-2 by  FDA under an Emergency Use Authorization (EUA). This EUA will remain  in effect (meaning this test can be used) for the duration of the  Covid-19 declaration under Section 564(b)(1) of the Act, 21  U.S.C. section 360bbb-3(b)(1), unless the authorization is  terminated or revoked. Performed at [facility], [place] [address], [place], [place] [address]   Body fluid culture (includes gram stain)     Status: None   Collection Time: 09/07/19 11:51 AM   Specimen: Pleural Fluid  Result Value Ref Range Status   Specimen Description   Final    PLEURAL Performed at [place] [address] [place], [place] [address]    Special Requests   Final    PLEURAL Performed at [facility], [place] [address] [place], [place] [address]    Gram Stain NO WBC SEEN NO ORGANISMS SEEN   Final   Culture   Final    NO GROWTH 3 DAYS Performed at [facility], [address] [place], [place] [address]    Report Status 09/11/2019 FINAL  Final  MRSA PCR  Screening     Status: Abnormal   Collection Time: 09/07/19  2:22 PM   Specimen: Nasopharyngeal  Result Value Ref Range Status   MRSA by PCR POSITIVE (A) NEGATIVE Final    Comment:        The GeneXpert MRSA Assay (FDA approved for NASAL specimens only), is one component of a comprehensive MRSA colonization surveillance program. It is not intended to diagnose MRSA infection nor to guide or monitor treatment for MRSA infections. RESULT CALLED TO, READ BACK BY AND VERIFIED WITHKathyrn Lass [id number] @ [address] Performed at [facility], [place] [address] [place], [place] [address]      Radiology Studies: No results found.   Domenic Polite, MD [facility]

## 2019-09-13 LAB — CBC
HCT: 30.6 % — ABNORMAL LOW (ref 39.0–52.0)
Hemoglobin: 9.4 g/dL — ABNORMAL LOW (ref 13.0–17.0)
MCH: 27.6 pg (ref 26.0–34.0)
MCHC: 30.7 g/dL (ref 30.0–36.0)
MCV: 89.7 fL (ref 80.0–100.0)
Platelets: 197 K/uL (ref 150–400)
RBC: 3.41 MIL/uL — ABNORMAL LOW (ref 4.22–5.81)
RDW: 17 % — ABNORMAL HIGH (ref 11.5–15.5)
WBC: 11 K/uL — ABNORMAL HIGH (ref 4.0–10.5)
nRBC: 0 % (ref 0.0–0.2)

## 2019-09-13 LAB — GLUCOSE, CAPILLARY
Glucose-Capillary: 157 mg/dL — ABNORMAL HIGH (ref 70–99)
Glucose-Capillary: 201 mg/dL — ABNORMAL HIGH (ref 70–99)
Glucose-Capillary: 200 mg/dL — ABNORMAL HIGH (ref 70–99)
Glucose-Capillary: 214 mg/dL — ABNORMAL HIGH (ref 70–99)

## 2019-09-13 NOTE — Progress Notes (Signed)
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 CT of the chest showed bilateral pleural effusion concerning for pulmonary edema with pericardial effusion. He was diuresed appropriately also underwent thoracentesis with minimal improvement. 2D echocardiogram showed new very depressed left ventricular ejection fraction of about 40% with apical wall motion abnormality. Cardiology consulted and he was diuresed appropriately however his creatinine continued to get worse and Lasix was discontinued. Nephrology consulted for AKI from diuresis suggested patient is not a hemodialysis candidate recommended comfort care. Palliative care consulted and on further discussions with the family transitioned the patient to supportive care and  plan to discharge to residential hospice when bed available.   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 Optimal improvement with aggressive diuresis with IV Lasix led to cardiorenal syndrome with AKI. Echocardiogram this admission showed newly diagnosed depressed left ventricular ejection fraction of 40 to 45% with left apical hypokinesis. Cardiology consulted and he was appropriately diuresed. Nephrology consulted for AKI from aggressive diuresis recommended patient not a candidate for hemodialysis and recommended comfort measures. Palliative consulted and after further discussions plan for residential hospice when bed available.   Acute metabolic encephalopathy probably secondary to hypercapnia and acute respiratory failure with hypoxia.    Type 2 diabetes mellitus with peripheral neuropathy and chronic kidney disease    History of bullous pemphigoid Continue with home present   History of neurogenic bladder with indwelling Foley catheter and history of ESBL Currently asymptomatic.    History of chronic ambulatory dysfunction with chronic pain syndrome along with the restless leg syndrome Continue with Requip    History of anxiety and depression Continue with Lexapro.  Hypothyroidism:  Continue with synthroid.     Pressure Injury 09/06/19 Toe (Comment  which one) Right Stage 2 -  Partial thickness loss of dermis presenting as a shallow open injury with a red, pink wound bed without slough. (Active)  09/06/19 1804  Location: Toe (Comment  which one) (2nd toe )  Location Orientation: Right  Staging: Stage 2 -  Partial thickness loss of dermis presenting as a shallow open injury with a red, pink wound bed without slough.  Wound Description (Comments):   Present on Admission: Yes     Pressure Injury 09/06/19 Toe (Comment  which one) Anterior;Left Unstageable - Full thickness tissue loss in which the base  of the  injury is covered by slough (yellow, tan, gray, green or brown) and/or eschar (tan, brown or black) in the wound bed. (Active)  09/06/19 1806  Location: Toe (Comment  which one) (2nd toe)  Location Orientation: Anterior;Left  Staging: Unstageable - Full thickness tissue loss in which the base of the injury is covered by slough (yellow, tan, gray, green or brown) and/or eschar (tan, brown or black) in the wound bed.  Wound Description (Comments):   Present on Admission: Yes     History of pulmonary embolism Continue with Eliquis.    DVT prophylaxis: Eliquis Code Status: DNR Family Communication: (None at bedside Disposition Plan: Residential hospice placement when bed available   Consultants:   Cardiology  Nephrology  Palliative care   Procedures: \  Ultrasound thoracentesis on admission  Antimicrobials: Completed the course of antibiotics Anti-infectives (From admission, onward)   Start     Dose/Rate Route Frequency Ordered Stop   09/07/19 1200  doxycycline (VIBRA-TABS) tablet 100 mg     100 mg Oral Every 12 hours 09/07/19 1117 09/10/19 0019   09/06/19 1000  vancomycin (VANCOREADY) IVPB 1750 mg/350 mL  Status:  Discontinued     1,750 mg 175 mL/hr over 120 Minutes Intravenous Every 24 hours 09/05/19 2105 09/07/19 1117   09/05/19 2315  remdesivir 200 mg in sodium chloride 0.9% 250 mL IVPB     200 mg 580 mL/hr over 30 Minutes Intravenous Once 09/05/19 2243 09/06/19 0002   09/05/19 2200  vancomycin (VANCOREADY) IVPB 2000 mg/400 mL     2,000 mg 200 mL/hr over 120 Minutes Intravenous  Once 09/05/19 2105 09/06/19 0055   09/05/19 2130  ceFEPIme (MAXIPIME) 2 g in sodium chloride 0.9 % 100 mL IVPB  Status:  Discontinued     2 g 200 mL/hr over 30 Minutes Intravenous Every 8 hours 09/05/19 2105 09/07/19 1117       Subjective: Patient appears comfortable,  denies any new complaints  Objective: Vitals:   09/12/19 2108 09/13/19 0553 09/13/19 0836 09/13/19 1413   BP: (!) 143/109   (!) 115/103  Pulse: 100   (!) 126  Resp: 18   18  Temp: 98 F (36.7 C)   (!) 97.4 F (36.3 C)  TempSrc:    Oral  SpO2: 92%  100% 100%  Weight:  (!) 218 kg    Height:        Intake/Output Summary (Last 24 hours) at 09/13/2019 1651 Last data filed at 09/13/2019 0916 Gross per 24 hour  Intake 360 ml  Output 220 ml  Net 140 ml   Filed Weights   09/11/19 0500 09/12/19 0500 09/13/19 0553  Weight: (!) 218 kg (!) 215 kg (!) 218 kg    Examination:  General exam: Morbidly obese chronically ill appearing gentleman with nasal cannula oxygen not in any distress. Respiratory system: Diminished air entry at bases Cardiovascular system: S1-S2 heard, tachycardic Gastrointestinal system: Abdomen is soft, nontender bowel sounds present Central nervous system: Alert and answering simple questions and following commands. Extremities: 3+ leg edema Skin: Sacral stage II ulcer Psychiatry: Could not be assessed as patient is confused    Data Reviewed: I have personally reviewed following labs and imaging studies  CBC: Recent Labs  Lab 09/07/19 0458 09/08/19 0527 09/09/19 0531 09/10/19 0151 09/11/19 0426 09/12/19 0441 09/13/19 0524  WBC 9.9 11.0* 10.8* 11.4* 10.5 11.2* 11.0*  NEUTROABS 9.1* 9.7* 8.7* 9.4*  --   --   --   HGB 10.5* 10.1* 10.0* 9.8* 8.8* 9.1*  9.4*  HCT 34.8* 34.0* 32.7* 32.1* 28.7* 29.4* 30.6*  MCV 90.2 89.2 89.8 90.7 89.4 88.3 89.7  PLT 342 289 241 223 208 184 735   Basic Metabolic Panel: Recent Labs  Lab 09/08/19 0527 09/08/19 1627 09/09/19 0531 09/10/19 0151 09/11/19 0426 09/12/19 0441  NA 124*  --  124* 123* 124* 122*  K 5.7* 5.6* 5.5* 5.3* 5.3* 5.2*  CL 89*  --  89* 87* 89* 85*  CO2 25  --  25 25 25 25   GLUCOSE 171*  --  200* 186* 206* 169*  BUN 51*  --  62* 65* 74* 87*  CREATININE 2.06*  --  2.39* 2.66* 2.97* 3.12*  3.11*  CALCIUM 8.1*  --  7.9* 7.8* 7.8* 7.7*  MG  --   --  2.5*  --   --   --    GFR: Estimated Creatinine  Clearance: 42.1 mL/min (A) (by C-G formula based on SCr of 3.11 mg/dL (H)). Liver Function Tests: Recent Labs  Lab 09/07/19 0458 09/08/19 0527 09/09/19 0531 09/10/19 0151  AST 12* 15 21 15   ALT 11 13 12 13   ALKPHOS 114 109 107 96  BILITOT 0.8 0.6 0.5 0.7  PROT 5.7* 5.4* 5.5* 5.2*  ALBUMIN 2.4* 2.2* 2.3* 2.5*   No results for input(s): LIPASE, AMYLASE in the last 168 hours. No results for input(s): AMMONIA in the last 168 hours. Coagulation Profile: No results for input(s): INR, PROTIME in the last 168 hours. Cardiac Enzymes: No results for input(s): CKTOTAL, CKMB, CKMBINDEX, TROPONINI in the last 168 hours. BNP (last 3 results) No results for input(s): PROBNP in the last 8760 hours. HbA1C: No results for input(s): HGBA1C in the last 72 hours. CBG: Recent Labs  Lab 09/12/19 1137 09/12/19 1627 09/12/19 2103 09/13/19 0815 09/13/19 1150  GLUCAP 200* 210* 199* 157* 201*   Lipid Profile: No results for input(s): CHOL, HDL, LDLCALC, TRIG, CHOLHDL, LDLDIRECT in the last 72 hours. Thyroid Function Tests: No results for input(s): TSH, T4TOTAL, FREET4, T3FREE, THYROIDAB in the last 72 hours. Anemia Panel: No results for input(s): VITAMINB12, FOLATE, FERRITIN, TIBC, IRON, RETICCTPCT in the last 72 hours. Sepsis Labs: Recent Labs  Lab 09/07/19 0458 09/09/19 2303 09/10/19 0151  PROCALCITON 0.12  --   --   LATICACIDVEN  --  1.3 1.2    Recent Results (from the past 240 hour(s))  Culture, blood (routine x 2)     Status: None   Collection Time: 09/05/19  8:29 PM   Specimen: BLOOD  Result Value Ref Range Status   Specimen Description   Final    BLOOD LEFT HAND Performed at [facility] [address] [place], [place] [address]    Special Requests   Final    BOTTLES DRAWN AEROBIC AND ANAEROBIC Blood Culture adequate volume Performed at [facility] [address], [place], [place] [address]    Culture   Final    NO GROWTH 5  DAYS Performed at [facility], [place] [address], [place], [place] [address]    Report Status 09/11/2019 FINAL  Final  Urine culture     Status: Abnormal   Collection Time: 09/05/19  8:29 PM   Specimen: Urine, Random  Result Value Ref Range Status   Specimen Description   Final    URINE, RANDOM Performed at [facility] [address] [place], [place] [address]    Special Requests   Final    NONE Performed at  [facility], [place] [address], [place], [place] [address]    Culture MULTIPLE SPECIES PRESENT, SUGGEST RECOLLECTION (A)  Final   Report Status 09/07/2019 FINAL  Final  Culture, blood (routine x 2)     Status: None   Collection Time: 09/05/19  8:34 PM   Specimen: BLOOD  Result Value Ref Range Status   Specimen Description   Final    BLOOD RIGHT HAND Performed at [facility] [address] [place], [place] [address]    Special Requests   Final    BOTTLES DRAWN AEROBIC AND ANAEROBIC Blood Culture adequate volume Performed at [facility] [address] [place], [place] [address]    Culture   Final    NO GROWTH 5 DAYS Performed at [facility], [place] [address] [place], [place] [address]    Report Status 09/11/2019 FINAL  Final  Respiratory Panel by RT PCR (Flu A&B, Covid) - Nasopharyngeal Swab     Status: None   Collection Time: 09/06/19  8:10 AM   Specimen: Nasopharyngeal Swab  Result Value Ref Range Status   SARS Coronavirus 2 by RT PCR NEGATIVE NEGATIVE Final    Comment: (NOTE) SARS-CoV-2 target nucleic acids are NOT DETECTED. The SARS-CoV-2 RNA is generally detectable in upper respiratoy specimens during the acute phase of infection. The lowest concentration of SARS-CoV-2 viral copies this assay can detect is 131 copies/mL. A negative result does not preclude SARS-Cov-2 infection and should not be used as the sole basis for treatment or other patient management decisions. A  negative result may occur with  improper specimen collection/handling, submission of specimen other than nasopharyngeal swab, presence of viral mutation(s) within the areas targeted by this assay, and inadequate number of viral copies (<131 copies/mL). A negative result must be combined with clinical observations, patient history, and epidemiological information. The expected result is Negative. Fact Sheet for Patients:  PinkCheek.be Fact Sheet for Healthcare Providers:  GravelBags.it This test is not yet ap proved or cleared by the [place] FDA and  has been authorized for detection and/or diagnosis of SARS-CoV-2 by FDA under an Emergency Use Authorization (EUA). This EUA will remain  in effect (meaning this test can be used) for the duration of the COVID-19 declaration under Section 564(b)(1) of the Act, 21 U.S.C. section 360bbb-3(b)(1), unless the authorization is terminated or revoked sooner.    Influenza A by PCR NEGATIVE NEGATIVE Final   Influenza B by PCR NEGATIVE NEGATIVE Final    Comment: (NOTE) The Xpert Xpress SARS-CoV-2/FLU/RSV assay is intended as an aid in  the diagnosis of influenza from Nasopharyngeal swab specimens and  should not be used as a sole basis for treatment. Nasal washings and  aspirates are unacceptable for Xpert Xpress SARS-CoV-2/FLU/RSV  testing. Fact Sheet for Patients: PinkCheek.be Fact Sheet for Healthcare Providers: GravelBags.it This test is not yet approved or cleared by the [place] FDA and  has been authorized for detection and/or diagnosis of SARS-CoV-2 by  FDA under an Emergency Use Authorization (EUA). This EUA will remain  in effect (meaning this test can be used) for the duration of the  Covid-19 declaration under Section 564(b)(1) of the Act, 21  U.S.C. section 360bbb-3(b)(1), unless the authorization is   terminated or revoked. Performed at [facility], [place] [address] [place], [place] [address]   Body fluid culture (includes gram stain)     Status: None   Collection Time: 09/07/19 11:51 AM  Specimen: Pleural Fluid  Result Value Ref Range Status   Specimen Description   Final    PLEURAL Performed at [facility] [address] [place], [place] [address]    Special Requests   Final    PLEURAL Performed at [facility], [place] [address] [place], [place] [address]    Gram Stain NO WBC SEEN NO ORGANISMS SEEN   Final   Culture   Final    NO GROWTH 3 DAYS Performed at [facility], [place] [address], [place], [place] [address]    Report Status 09/11/2019 FINAL  Final  MRSA PCR Screening     Status: Abnormal   Collection Time: 09/07/19  2:22 PM   Specimen: Nasopharyngeal  Result Value Ref Range Status   MRSA by PCR POSITIVE (A) NEGATIVE Final    Comment:        The GeneXpert MRSA Assay (FDA approved for NASAL specimens only), is one component of a comprehensive MRSA colonization surveillance program. It is not intended to diagnose MRSA infection nor to guide or monitor treatment for MRSA infections. RESULT CALLED TO, READ BACK BY AND VERIFIED WITHKathyrn Lass [id number] @ [address] [place] Performed at [facility], [place] [address] [place], [place] [address]          Radiology Studies: No results found.      Scheduled Meds: . amLODipine  5 mg Oral Daily  . apixaban  2.5 mg Oral BID  . arformoterol  15 mcg Nebulization BID  . vitamin C  500 mg Oral BID  . aspirin EC  81 mg Oral Daily  . budesonide (PULMICORT) nebulizer solution  0.25 mg Nebulization BID  . Chlorhexidine Gluconate Cloth  6 each Topical Daily  . docusate sodium  100 mg Oral BID  . escitalopram  20 mg Oral Daily  . ezetimibe  10 mg Oral Daily  . feeding supplement (PRO-STAT SUGAR FREE 64)  30 mL Oral TID BM   . ferrous sulfate  325 mg Oral BID WC  . folic acid  1 mg Oral Daily  . gabapentin  300 mg Oral QHS  . Gerhardt's butt cream   Topical BID  . hydrocerin   Topical BID  . insulin aspart  0-5 Units Subcutaneous QHS  . insulin aspart  0-9 Units Subcutaneous TID WC  . insulin glargine  10 Units Subcutaneous Daily  . levothyroxine  25 mcg Oral QAC breakfast  . magnesium oxide  400 mg Oral Daily  . multivitamin with minerals  1 tablet Oral Daily  . mupirocin ointment   Topical Daily  . nutrition supplement (JUVEN)  1 packet Oral BID BM  . pantoprazole  40 mg Oral Daily  . predniSONE  10 mg Oral Q breakfast  . rOPINIRole  0.5 mg Oral QHS  . rosuvastatin  5 mg Oral Daily   Continuous Infusions: . furosemide       LOS: 8 days        Hosie Poisson, MD [facility]

## 2019-09-13 NOTE — Progress Notes (Signed)
Palliative care progress note  Reason for visit: goals of care in light of worsening renal failure  Chart reviewed.  Discussed with Dr. Karleen Hampshire.  I met today with [name].  He was less energetic today at time of my encounter.    Denies complaints and states that he just feels "in the clouds."  He does not recall speaking with his brother in the last couple of days (even though he has).  I asked his RN who will try to work to connect them today.  Questions and concerns addressed.   PMT will follow up tomorrow.  - DNR/DNI - Discussed with his brother and sister in law via phone on 09/11/2018 regarding renal failure and likelihood that this will be terminal regardless of interventions.  - [name]  His brother feels this is also the case and requests that we not continue to have same conversation with him about the fact that he is going to die as he does not remember and gets upset each time that it occurs. -We will likely be transitioning to more of a comfort focus over the next few days.  Consideration for transition to residential hospice as he continues to decline.  Total time: 20 minutes Greater than 50%  of this time was spent counseling and coordinating care related to the above assessment and plan.  Micheline Rough, MD [facility] [phone]

## 2019-09-14 ENCOUNTER — Inpatient Hospital Stay (HOSPITAL_COMMUNITY): Payer: Medicare Other

## 2019-09-14 DIAGNOSIS — R112 Nausea with vomiting, unspecified: Secondary | ICD-10-CM

## 2019-09-14 LAB — GLUCOSE, CAPILLARY
Glucose-Capillary: 169 mg/dL — ABNORMAL HIGH (ref 70–99)
Glucose-Capillary: 166 mg/dL — ABNORMAL HIGH (ref 70–99)
Glucose-Capillary: 204 mg/dL — ABNORMAL HIGH (ref 70–99)
Glucose-Capillary: 120 mg/dL — ABNORMAL HIGH (ref 70–99)

## 2019-09-14 LAB — CREATININE, SERUM
Creatinine, Ser: 3.31 mg/dL — ABNORMAL HIGH (ref 0.61–1.24)
GFR calc Af Amer: 21 mL/min — ABNORMAL LOW (ref >60)
GFR calc non Af Amer: 18 mL/min — ABNORMAL LOW (ref >60)

## 2019-09-14 MED ORDER — ONDANSETRON HCL 4 MG/2ML IJ SOLN
4.0000 mg | Freq: Four times a day (QID) | INTRAMUSCULAR | Status: DC | PRN
  Administered 2019-09-14: 16:00:00 4 mg via INTRAVENOUS
  Filled 2019-09-14: qty 2

## 2019-09-14 NOTE — Progress Notes (Addendum)
Attempted to administer PO morning medications, pt refused x2. MD Karleen Hampshire made aware. Will attempt to give meds at a later time. Will continue to monitor patient.

## 2019-09-14 NOTE — Progress Notes (Signed)
Palliative care progress note  Reason for visit: goals of care in light of worsening renal failure  Chart reviewed.  Discussed with Dr. Karleen Hampshire.  I met today with [name].  He was a little irritable at time of my encounter which is unusual for him.    I called and was able to reach his brother, Linna Hoff, and sister-in-law, Marliss Coots.  Linna Hoff reports that they spoke with [name] again last night and had a good conversation.  [name] has expressed that he is hopeful to get better and wants to continue with current interventions.  Linna Hoff and [place] both understand that this is not going to happen, however, they want to keep with his wishes as long as he is able to express them.    We discussed plan to continue with current gentle medical interventions while [name] is able to express that this is his wish.  We also discussed that when the time comes that he becomes encephalopathic due to his renal failure, he would likely be best served by transitioning to residential hospice.  Both Linna Hoff and [place] agree that when the time comes that [name] is not able to express his desires, he would best be served by transition to residential hospice.  At this time, however, they want to continue with current interventions per his wishes.  - DNR/DNI - Discussed with his brother and sister in law via phone regarding renal failure and likelihood that this will be terminal regardless of interventions.  - I am still not sure how much Mr. Schreiner is really processing information that is given to him.  His brother feels this is also the case and requests that we not continue to have same conversation with him about the fact that he is going to die as he does not remember and gets upset each time that it occurs.  His brother and sister-in-law understand that he is not going to get better, however, [name] continues to express wanting to continue with current interventions.  Plan to continue with current interventions while he is able to express  this wish, however, when the point comes that he becomes encephalopathic and is transitioning to actively dying his family is in agreement with working to pursue placement at residential hospice. -We will likely be transitioning to more of a comfort focus over the next few days as [name] becomes more confused and transitions to actively dying.  Consideration for transition to residential hospice as he continues to decline.  Total time: 30 minutes Greater than 50%  of this time was spent counseling and coordinating care related to the above assessment and plan.  Micheline Rough, MD [facility] [phone]

## 2019-09-14 NOTE — Progress Notes (Signed)
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 CT of the chest showed bilateral pleural effusion concerning for pulmonary edema with pericardial effusion. He was diuresed appropriately also underwent thoracentesis with minimal improvement. 2D echocardiogram showed new very depressed left ventricular ejection fraction of about 40% with apical wall motion abnormality. Cardiology consulted and he was diuresed appropriately however his creatinine continued to get worse and Lasix was discontinued. Nephrology consulted for AKI from diuresis suggested patient is not a hemodialysis candidate recommended comfort care. Palliative care consulted and on further discussions with the family transitioned the patient to supportive care and  plan to discharge to residential hospice in 1 to 2 days, as pt does not feel ready.  RN notified that patient has been spitting up occasionally and is nauseated.  As needed Zofran ordered.   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 Optimal improvement with aggressive diuresis with IV Lasix leading to cardiorenal syndrome with AKI. Echocardiogram this admission showed newly diagnosed depressed left ventricular ejection fraction of 40 to 45% with left apical hypokinesis. Cardiology consulted and he was appropriately diuresed. Nephrology consulted for AKI from aggressive diuresis recommended patient not a candidate for hemodialysis and suggested comfort measures. Palliative consulted and after further discussions with the family and patient plan for residential hospice in 1 to 2 days.   Acute metabolic encephalopathy probably secondary to hypercapnia and acute respiratory failure with hypoxia. Patient is alert but confused  Type 2 diabetes mellitus with peripheral neuropathy and chronic kidney disease CBG (last 3)  Recent Labs    09/14/19 0752 09/14/19 1216 09/14/19 1653  GLUCAP 169* 166* 204*   Resume sliding scale insulin until discharged to residential hospice  History of bullous pemphigoid Continue with home medications.   History of neurogenic bladder with indwelling Foley catheter and history of ESBL Continue with Foley catheter.  No symptoms of dysuria at this time.    History of chronic ambulatory dysfunction with chronic pain syndrome along with the restless leg syndrome Continue with Requip. Continue with pain medications.    History of anxiety and depression Continue with Lexapro.  Hypothyroidism:  Continue with Synthroid.   Anemia of chronic disease Hemoglobin stable around 9.   Nausea and some vomiting/spitting up Abdominal x-ray to rule out obstruction.    Pressure injury present on  admission Pressure Injury 09/06/19 Toe (  Comment  which one) Right Stage 2 -  Partial thickness loss of dermis presenting as a shallow open injury with a red, pink wound bed without slough. (Active)  09/06/19 1804  Location: Toe (Comment  which one) (2nd toe )  Location Orientation: Right  Staging: Stage 2 -  Partial thickness loss of dermis presenting as a shallow open injury with a red, pink wound bed without slough.  Wound Description (Comments):   Present on Admission: Yes     Pressure Injury 09/06/19 Toe (Comment  which one) Anterior;Left Unstageable - Full thickness tissue loss in which the base of the injury is covered by slough (yellow, tan, gray, green or brown) and/or eschar (tan, brown or black) in the wound bed. (Active)  09/06/19 1806  Location: Toe (Comment  which one) (2nd toe)  Location Orientation: Anterior;Left  Staging: Unstageable - Full thickness tissue loss in which the base of the injury is covered by slough (yellow, tan, gray, green or brown) and/or eschar (tan, brown or black) in the wound bed.  Wound Description (Comments):   Present on Admission: Yes     History of pulmonary embolism Continue with Eliquis.    DVT prophylaxis: Eliquis Code Status: DNR Family Communication: (None at bedside Disposition Plan: Residential hospice placement when patient and family decides.   Consultants:   Cardiology  Nephrology  Palliative care   Procedures: \  Ultrasound thoracentesis on admission  Antimicrobials: Completed the course of antibiotics Anti-infectives (From admission, onward)   Start     Dose/Rate Route Frequency Ordered Stop   09/07/19 1200  doxycycline (VIBRA-TABS) tablet 100 mg     100 mg Oral Every 12 hours 09/07/19 1117 09/10/19 0019   09/06/19 1000  vancomycin (VANCOREADY) IVPB 1750 mg/350 mL  Status:  Discontinued     1,750 mg 175 mL/hr over 120 Minutes Intravenous Every 24 hours 09/05/19 2105 09/07/19 1117   09/05/19 2315  remdesivir 200  mg in sodium chloride 0.9% 250 mL IVPB     200 mg 580 mL/hr over 30 Minutes Intravenous Once 09/05/19 2243 09/06/19 0002   09/05/19 2200  vancomycin (VANCOREADY) IVPB 2000 mg/400 mL     2,000 mg 200 mL/hr over 120 Minutes Intravenous  Once 09/05/19 2105 09/06/19 0055   09/05/19 2130  ceFEPIme (MAXIPIME) 2 g in sodium chloride 0.9 % 100 mL IVPB  Status:  Discontinued     2 g 200 mL/hr over 30 Minutes Intravenous Every 8 hours 09/05/19 2105 09/07/19 1117       Subjective: Patient reports is not a good day for him. RN notified patient is nauseated and has been spitting up.  Abdominal x-ray ordered to rule out obstruction.  Objective: Vitals:   09/14/19 0634 09/14/19 0640 09/14/19 1000 09/14/19 1353  BP: (!) 84/54   94/63  Pulse: (!) 56   69  Resp: 18   14  Temp: (!) 97.4 F (36.3 C)   97.8 F (36.6 C)  TempSrc: Oral   Oral  SpO2: 100%  100%   Weight:  (!) 156 kg    Height:        Intake/Output Summary (Last 24 hours) at 09/14/2019 1703 Last data filed at 09/14/2019 1002 Gross per 24 hour  Intake --  Output 425 ml  Net -425 ml   Filed Weights   09/12/19 0500 09/13/19 0553 09/14/19 0640  Weight: (!) 215 kg (!) 218 kg (!) 156 kg    Examination:  General exam: Chronically ill-appearing, morbidly obese gentleman  with nasal cannula oxygen.   Respiratory system: Diminished air entry at bases, no wheezing. Cardiovascular system: S1-S2 heard, regular rate rhythm Gastrointestinal system: Abdomen is soft, nontender, nondistended bowel sounds are present Central nervous system: Alert but confused Extremities: 3+ leg edema present Skin: Sacral stage II ulcer Psychiatry: Could not be assessed at this time.    Data Reviewed: I have personally reviewed following labs and imaging studies  CBC: Recent Labs  Lab 09/08/19 0527 09/09/19 0531 09/10/19 0151 09/11/19 0426 09/12/19 0441 09/13/19 0524  WBC 11.0* 10.8* 11.4* 10.5 11.2* 11.0*  NEUTROABS 9.7* 8.7* 9.4*  --   --   --    HGB 10.1* 10.0* 9.8* 8.8* 9.1* 9.4*  HCT 34.0* 32.7* 32.1* 28.7* 29.4* 30.6*  MCV 89.2 89.8 90.7 89.4 88.3 89.7  PLT 289 241 223 208 184 384   Basic Metabolic Panel: Recent Labs  Lab 09/08/19 0527 09/08/19 1627 09/09/19 0531 09/10/19 0151 09/11/19 0426 09/12/19 0441 09/14/19 0508  NA 124*  --  124* 123* 124* 122*  --   K 5.7* 5.6* 5.5* 5.3* 5.3* 5.2*  --   CL 89*  --  89* 87* 89* 85*  --   CO2 25  --  25 25 25 25   --   GLUCOSE 171*  --  200* 186* 206* 169*  --   BUN 51*  --  62* 65* 74* 87*  --   CREATININE 2.06*  --  2.39* 2.66* 2.97* 3.12*  3.11* 3.31*  CALCIUM 8.1*  --  7.9* 7.8* 7.8* 7.7*  --   MG  --   --  2.5*  --   --   --   --    GFR: Estimated Creatinine Clearance: 32.3 mL/min (A) (by C-G formula based on SCr of 3.31 mg/dL (H)). Liver Function Tests: Recent Labs  Lab 09/08/19 0527 09/09/19 0531 09/10/19 0151  AST 15 21 15   ALT 13 12 13   ALKPHOS 109 107 96  BILITOT 0.6 0.5 0.7  PROT 5.4* 5.5* 5.2*  ALBUMIN 2.2* 2.3* 2.5*   No results for input(s): LIPASE, AMYLASE in the last 168 hours. No results for input(s): AMMONIA in the last 168 hours. Coagulation Profile: No results for input(s): INR, PROTIME in the last 168 hours. Cardiac Enzymes: No results for input(s): CKTOTAL, CKMB, CKMBINDEX, TROPONINI in the last 168 hours. BNP (last 3 results) No results for input(s): PROBNP in the last 8760 hours. HbA1C: No results for input(s): HGBA1C in the last 72 hours. CBG: Recent Labs  Lab 09/13/19 1718 09/13/19 2247 09/14/19 0752 09/14/19 1216 09/14/19 1653  GLUCAP 200* 214* 169* 166* 204*   Lipid Profile: No results for input(s): CHOL, HDL, LDLCALC, TRIG, CHOLHDL, LDLDIRECT in the last 72 hours. Thyroid Function Tests: No results for input(s): TSH, T4TOTAL, FREET4, T3FREE, THYROIDAB in the last 72 hours. Anemia Panel: No results for input(s): VITAMINB12, FOLATE, FERRITIN, TIBC, IRON, RETICCTPCT in the last 72 hours. Sepsis Labs: Recent Labs  Lab  09/09/19 2303 09/10/19 0151  LATICACIDVEN 1.3 1.2    Recent Results (from the past 240 hour(s))  Culture, blood (routine x 2)     Status: None   Collection Time: 09/05/19  8:29 PM   Specimen: BLOOD  Result Value Ref Range Status   Specimen Description   Final    BLOOD LEFT HAND Performed at [facility] [address], [place], [place] [address]    Special Requests   Final    BOTTLES DRAWN AEROBIC AND ANAEROBIC  Blood Culture adequate volume Performed at [facility] [address], [place], [place] [address]    Culture   Final    NO GROWTH 5 DAYS Performed at [facility], [place] [address] [place], [place] [address]    Report Status 09/11/2019 FINAL  Final  Urine culture     Status: Abnormal   Collection Time: 09/05/19  8:29 PM   Specimen: Urine, Random  Result Value Ref Range Status   Specimen Description   Final    URINE, RANDOM Performed at [facility] [address] [place], [place] [address]    Special Requests   Final    NONE Performed at [facility], [place] [address] [place], [place] [address]    Culture MULTIPLE SPECIES PRESENT, SUGGEST RECOLLECTION (A)  Final   Report Status 09/07/2019 FINAL  Final  Culture, blood (routine x 2)     Status: None   Collection Time: 09/05/19  8:34 PM   Specimen: BLOOD  Result Value Ref Range Status   Specimen Description   Final    BLOOD RIGHT HAND Performed at [facility] [address], [place], [place] [address]    Special Requests   Final    BOTTLES DRAWN AEROBIC AND ANAEROBIC Blood Culture adequate volume Performed at [facility] [name] [place], [place] [address]    Culture   Final    NO GROWTH 5 DAYS Performed at [facility], [place] [address], [place], [place] [address]    Report Status 09/11/2019 FINAL  Final  Respiratory Panel by RT PCR (Flu A&B, Covid) - Nasopharyngeal Swab      Status: None   Collection Time: 09/06/19  8:10 AM   Specimen: Nasopharyngeal Swab  Result Value Ref Range Status   SARS Coronavirus 2 by RT PCR NEGATIVE NEGATIVE Final    Comment: (NOTE) SARS-CoV-2 target nucleic acids are NOT DETECTED. The SARS-CoV-2 RNA is generally detectable in upper respiratoy specimens during the acute phase of infection. The lowest concentration of SARS-CoV-2 viral copies this assay can detect is 131 copies/mL. A negative result does not preclude SARS-Cov-2 infection and should not be used as the sole basis for treatment or other patient management decisions. A negative result may occur with  improper specimen collection/handling, submission of specimen other than nasopharyngeal swab, presence of viral mutation(s) within the areas targeted by this assay, and inadequate number of viral copies (<131 copies/mL). A negative result must be combined with clinical observations, patient history, and epidemiological information. The expected result is Negative. Fact Sheet for Patients:  PinkCheek.be Fact Sheet for Healthcare Providers:  GravelBags.it This test is not yet ap proved or cleared by the [place] FDA and  has been authorized for detection and/or diagnosis of SARS-CoV-2 by FDA under an Emergency Use Authorization (EUA). This EUA will remain  in effect (meaning this test can be used) for the duration of the COVID-19 declaration under Section 564(b)(1) of the Act, 21 U.S.C. section 360bbb-3(b)(1), unless the authorization is terminated or revoked sooner.    Influenza A by PCR NEGATIVE NEGATIVE Final   Influenza B by PCR NEGATIVE NEGATIVE Final    Comment: (NOTE) The Xpert Xpress SARS-CoV-2/FLU/RSV assay is intended as an aid in  the diagnosis of influenza from Nasopharyngeal swab specimens and  should not be used as a sole basis for treatment. Nasal washings and  aspirates are unacceptable for  Xpert Xpress SARS-CoV-2/FLU/RSV  testing.  Fact Sheet for Patients: PinkCheek.be Fact Sheet for Healthcare Providers: GravelBags.it This test is not yet approved or cleared by the [place] FDA and  has been authorized for detection and/or diagnosis of SARS-CoV-2 by  FDA under an Emergency Use Authorization (EUA). This EUA will remain  in effect (meaning this test can be used) for the duration of the  Covid-19 declaration under Section 564(b)(1) of the Act, 21  U.S.C. section 360bbb-3(b)(1), unless the authorization is  terminated or revoked. Performed at [facility], [place] [address] [place], [place] [address]   Body fluid culture (includes gram stain)     Status: None   Collection Time: 09/07/19 11:51 AM   Specimen: Pleural Fluid  Result Value Ref Range Status   Specimen Description   Final    PLEURAL Performed at [facility] [address], [place], [place] [address]    Special Requests   Final    PLEURAL Performed at [facility], [place] [address] [place], [place] [address]    Gram Stain NO WBC SEEN NO ORGANISMS SEEN   Final   Culture   Final    NO GROWTH 3 DAYS Performed at [facility], [place] [address] [place], [place] [address]    Report Status 09/11/2019 FINAL  Final  MRSA PCR Screening     Status: Abnormal   Collection Time: 09/07/19  2:22 PM   Specimen: Nasopharyngeal  Result Value Ref Range Status   MRSA by PCR POSITIVE (A) NEGATIVE Final    Comment:        The GeneXpert MRSA Assay (FDA approved for NASAL specimens only), is one component of a comprehensive MRSA colonization surveillance program. It is not intended to diagnose MRSA infection nor to guide or monitor treatment for MRSA infections. RESULT CALLED TO, READ BACK BY AND VERIFIED WITHKathyrn Lass [id number] @ [address] Performed at [facility], [place] [address] [place], [place] [address]          Radiology Studies: No results found.      Scheduled Meds: . amLODipine  5 mg Oral Daily  . apixaban  2.5 mg Oral BID  . arformoterol  15 mcg Nebulization BID  . vitamin C  500 mg Oral BID  . aspirin EC  81 mg Oral Daily  . budesonide (PULMICORT) nebulizer solution  0.25 mg Nebulization BID  . Chlorhexidine Gluconate Cloth  6 each Topical Daily  . docusate sodium  100 mg Oral BID  . escitalopram  20 mg Oral Daily  . ezetimibe  10 mg Oral Daily  . feeding supplement (PRO-STAT SUGAR FREE 64)  30 mL Oral TID BM  . ferrous sulfate  325 mg Oral BID WC  . folic acid  1 mg Oral Daily  . gabapentin  300 mg Oral QHS  . Gerhardt's butt cream   Topical BID  . hydrocerin   Topical BID  . insulin aspart  0-5 Units Subcutaneous QHS  . insulin aspart  0-9 Units Subcutaneous TID WC  . insulin glargine  10 Units Subcutaneous Daily  . levothyroxine  25 mcg Oral QAC breakfast  . magnesium oxide  400 mg Oral Daily  . multivitamin with minerals  1 tablet Oral Daily  . mupirocin ointment   Topical Daily  . nutrition supplement (JUVEN)  1 packet Oral BID BM  . pantoprazole  40 mg Oral Daily  . predniSONE  10 mg Oral Q breakfast  .  rOPINIRole  0.5 mg Oral QHS  . rosuvastatin  5 mg Oral Daily   Continuous Infusions: . furosemide       LOS: 9 days        Hosie Poisson, MD [facility]

## 2019-09-14 NOTE — Care Management Important Message (Signed)
Important Message  Patient Details IM Letter given to Nancy Marus RN Case Manager to present to the [facility] Name: [name] MRN: [id number] Date of Birth: [birth year]   Medicare Important Message Given:  Yes     Kerin Salen 09/14/2019, 12:44 PM

## 2019-09-14 NOTE — TOC Progression Note (Signed)
Transition of Care [facility]) - Progression Note    Patient Details  Name: [name] MRN: [id number] Date of Birth: 07-30-1946  [facility] CM/SW Contact  Joaquin Courts, RN Phone Number: 09/14/2019, 2:11 PM  Clinical Narrative: CM received call from MD stating patient will need residential hospice. When CM spoke with family, they were not prepared to make this decision at this time and wished to discuss things further with MD. CM notified MD of family request.               Expected Discharge Plan and Services                                                 Social Determinants of Health (SDOH) Interventions    Readmission Risk Interventions No flowsheet data found.

## 2019-09-14 NOTE — Progress Notes (Signed)
Patient's BP has been in the 80's on shift, See flow sheet. Noted yesterday his diastolic was over 449 with other readings in the 90's. Spoke with Dr. Humphrey Rolls and no new orders at this time. Patient with no current complaints.

## 2019-09-14 NOTE — Progress Notes (Signed)
Pt with intermittent vomiting/coughing up tan liquid material. PRN Zofran given. Pt states he feels like something is in his throat. Head of bed elevated, suction set up, and MD Akula made aware. New orders placed. Will continue to monitor patient.

## 2019-09-15 DIAGNOSIS — K1379 Other lesions of oral mucosa: Secondary | ICD-10-CM

## 2019-09-15 LAB — GLUCOSE, CAPILLARY
Glucose-Capillary: 248 mg/dL — ABNORMAL HIGH (ref 70–99)
Glucose-Capillary: 215 mg/dL — ABNORMAL HIGH (ref 70–99)
Glucose-Capillary: 169 mg/dL — ABNORMAL HIGH (ref 70–99)
Glucose-Capillary: 99 mg/dL (ref 70–99)
Glucose-Capillary: 97 mg/dL (ref 70–99)
Glucose-Capillary: 98 mg/dL (ref 70–99)
Glucose-Capillary: 104 mg/dL — ABNORMAL HIGH (ref 70–99)

## 2019-09-15 LAB — TROPONIN I (HIGH SENSITIVITY): Troponin I (High Sensitivity): 13 ng/L (ref <18)

## 2019-09-15 MED ORDER — LIDOCAINE VISCOUS HCL 2 % MT SOLN: 15.0000 mL | Freq: Four times a day (QID) | OROMUCOSAL | Status: DC | PRN

## 2019-09-15 NOTE — Progress Notes (Signed)
Patient with complaints of mouth pain when eating breakfast. This nurse assessed inside of pt mouth with no significant findings. Pt declined finishing breakfast due to pain. Also, pt declining all PO medications this morning, including his pain medication. MD Karleen Hampshire made aware. Will continue to monitor patient.

## 2019-09-15 NOTE — Progress Notes (Signed)
Pt brother, Linna Hoff, requesting that staff attempt to facilitate communication between patient and himself daily. Contact number is in chart, also okay to speak with [name]'s wife, Marliss Coots.

## 2019-09-15 NOTE — Progress Notes (Signed)
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 CT of the chest showed bilateral pleural effusion concerning for pulmonary edema with pericardial effusion. He was diuresed appropriately also underwent thoracentesis with minimal improvement. 2D echocardiogram showed new very depressed left ventricular ejection fraction of about 40% with apical wall motion abnormality. Cardiology consulted and he was diuresed appropriately however his creatinine continued to get worse and Lasix was discontinued. Nephrology consulted for AKI from diuresis suggested patient is not a hemodialysis candidate recommended comfort care. Palliative care consulted and on further discussions with the family transitioned the patient to supportive care and  plan to discharge to residential hospice in 1 to 2 days, depending on his progress.  Patient has been refusing meds the last 2 days as per the RN hence we discontinued telemetry and cholesterol medication, multivitamins and iron tablets, nonessential medications after discussing with his brother.  Patient did not want any lab draws or insulin injections at this time.  Discussed with the patient's brother and discontinued lab draws and sliding scale insulin and Lantus.  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 Optimal improvement with aggressive diuresis with IV Lasix leading to cardiorenal syndrome with AKI. Echocardiogram this admission showed newly diagnosed depressed left ventricular ejection fraction of 40 to 45% with left apical hypokinesis. Cardiology consulted and he was appropriately diuresed. Nephrology consulted for AKI from aggressive diuresis recommended patient not a candidate for hemodialysis and suggested comfort measures. Palliative consulted and after further discussions with the family and patient plan for residential hospice in 1 to 2 days depending on the clinical deterioration of the patient   Acute metabolic encephalopathy probably secondary to hypercapnia and acute respiratory failure with hypoxia. Patient is alert has memory deficits and is confused. Patient has been refusing meds for the last 2 days as per the RN.  Type 2 diabetes mellitus with peripheral neuropathy and chronic kidney disease CBG (last 3)  Recent Labs    09/14/19 2054 09/15/19 0751 09/15/19 1150  GLUCAP 120* 99 97  Discontinued sliding scale insulin and Lantus as he refused earlier this morning.    History of bullous pemphigoid Continue with prednisone 10 mg daily and PPI for GI prophylaxis.   History of neurogenic bladder with indwelling Foley catheter and history of ESBL Continue with Foley catheter.  No symptoms of dysuria at this  time. Urine  output has been 575 with the last 24 hours.    History of chronic ambulatory dysfunction with chronic pain syndrome along with the restless leg syndrome Continue with Requip. Continue with pain medications.    History of anxiety and depression Continue with Lexapro.  Hypothyroidism:  Continue with Synthroid.   Anemia of chronic disease Hemoglobin stable around 9.  No new blood draws at this time.   Nausea and some vomiting/spitting up Abdominal x-ray to rule out obstruction. Abdominal x-ray does not show any obstruction at this time.    Pressure injury present on admission Pressure Injury 09/06/19 Toe (Comment  which one) Right Stage 2 -  Partial thickness loss of dermis presenting as a shallow open injury with a red, pink wound bed without slough. (Active)  09/06/19 1804  Location: Toe (Comment  which one) (2nd toe )  Location Orientation: Right  Staging: Stage 2 -  Partial thickness loss of dermis presenting as a shallow open injury with a red, pink wound bed without slough.  Wound Description (Comments):   Present on Admission: Yes     Pressure Injury 09/06/19 Toe (Comment  which one) Anterior;Left Unstageable - Full thickness tissue loss in which the base of the injury is covered by slough (yellow, tan, gray, green or brown) and/or eschar (tan, brown or black) in the wound bed. (Active)  09/06/19 1806  Location: Toe (Comment  which one) (2nd toe)  Location Orientation: Anterior;Left  Staging: Unstageable - Full thickness tissue loss in which the base of the injury is covered by slough (yellow, tan, gray, green or brown) and/or eschar (tan, brown or black) in the wound bed.  Wound Description (Comments):   Present on Admission: Yes     History of pulmonary embolism Continue with Eliquis    DVT prophylaxis: Eliquis Code Status: DNR Family Communication: (None at bedside Disposition Plan: Residential hospice placement when patient and family  decides.   Consultants:   Cardiology  Nephrology  Palliative care   Procedures: \  Ultrasound thoracentesis on admission  Antimicrobials: Completed the course of antibiotics Anti-infectives (From admission, onward)   Start     Dose/Rate Route Frequency Ordered Stop   09/07/19 1200  doxycycline (VIBRA-TABS) tablet 100 mg     100 mg Oral Every 12 hours 09/07/19 1117 09/10/19 0019   09/06/19 1000  vancomycin (VANCOREADY) IVPB 1750 mg/350 mL  Status:  Discontinued     1,750 mg 175 mL/hr over 120 Minutes Intravenous Every 24 hours 09/05/19 2105 09/07/19 1117   09/05/19 2315  remdesivir 200 mg in sodium chloride 0.9% 250 mL IVPB     200 mg 580 mL/hr over 30 Minutes Intravenous Once 09/05/19 2243 09/06/19 0002   09/05/19 2200  vancomycin (VANCOREADY) IVPB 2000 mg/400 mL     2,000 mg 200 mL/hr over 120 Minutes Intravenous  Once 09/05/19 2105 09/06/19 0055   09/05/19 2130  ceFEPIme (MAXIPIME) 2 g in sodium chloride 0.9 % 100 mL IVPB  Status:  Discontinued     2 g 200 mL/hr over 30 Minutes Intravenous Every 8 hours 09/05/19 2105 09/07/19 1117       Subjective: Patient reports he is comfortable but has refused all medications this morning.  He does not appear to be in distress. Objective: Vitals:   09/14/19 2103 09/14/19 2103 09/15/19 0600 09/15/19 0828  BP: 95/60  110/60   Pulse:  (!) 54 63   Resp:  20 16   Temp:  (!) 97.1 F (36.2 C) (!) 97.2  F (36.2 C)   TempSrc:  Oral Oral   SpO2:  100% 100% 98%  Weight:      Height:        Intake/Output Summary (Last 24 hours) at 09/15/2019 1350 Last data filed at 09/15/2019 0600 Gross per 24 hour  Intake 300 ml  Output 350 ml  Net -50 ml   Filed Weights   09/12/19 0500 09/13/19 0553 09/14/19 0640  Weight: (!) 215 kg (!) 218 kg (!) 156 kg    Examination:  General exam: Chronically ill-appearing, morbidly obese gentleman with 2 L of nasal cannula oxygen, pleasantly confused and does not appear to be in any kind of distress at  this time. Respiratory system: Diminished air entry, no wheezing or rhonchi heard Cardiovascular system: S1-S2 heard, regular rate rhythm Gastrointestinal system: Abdomen is soft, nontender, nondistended bowel sounds are hypoactive. Central nervous system: Alert, confused, not in any kind of distress, Extremities: 3+ leg edema present Skin: Sacral stage II ulcer Psychiatry: Patient is confused, cannot be assessed.    Data Reviewed: I have personally reviewed following labs and imaging studies  CBC: Recent Labs  Lab 09/09/19 0531 09/10/19 0151 09/11/19 0426 09/12/19 0441 09/13/19 0524  WBC 10.8* 11.4* 10.5 11.2* 11.0*  NEUTROABS 8.7* 9.4*  --   --   --   HGB 10.0* 9.8* 8.8* 9.1* 9.4*  HCT 32.7* 32.1* 28.7* 29.4* 30.6*  MCV 89.8 90.7 89.4 88.3 89.7  PLT 241 223 208 184 962   Basic Metabolic Panel: Recent Labs  Lab 09/08/19 1627 09/09/19 0531 09/10/19 0151 09/11/19 0426 09/12/19 0441 09/14/19 0508  NA  --  124* 123* 124* 122*  --   K 5.6* 5.5* 5.3* 5.3* 5.2*  --   CL  --  89* 87* 89* 85*  --   CO2  --  25 25 25 25   --   GLUCOSE  --  200* 186* 206* 169*  --   BUN  --  62* 65* 74* 87*  --   CREATININE  --  2.39* 2.66* 2.97* 3.12*  3.11* 3.31*  CALCIUM  --  7.9* 7.8* 7.8* 7.7*  --   MG  --  2.5*  --   --   --   --    GFR: Estimated Creatinine Clearance: 32.3 mL/min (A) (by C-G formula based on SCr of 3.31 mg/dL (H)). Liver Function Tests: Recent Labs  Lab 09/09/19 0531 09/10/19 0151  AST 21 15  ALT 12 13  ALKPHOS 107 96  BILITOT 0.5 0.7  PROT 5.5* 5.2*  ALBUMIN 2.3* 2.5*   No results for input(s): LIPASE, AMYLASE in the last 168 hours. No results for input(s): AMMONIA in the last 168 hours. Coagulation Profile: No results for input(s): INR, PROTIME in the last 168 hours. Cardiac Enzymes: No results for input(s): CKTOTAL, CKMB, CKMBINDEX, TROPONINI in the last 168 hours. BNP (last 3 results) No results for input(s): PROBNP in the last 8760  hours. HbA1C: No results for input(s): HGBA1C in the last 72 hours. CBG: Recent Labs  Lab 09/14/19 1216 09/14/19 1653 09/14/19 2054 09/15/19 0751 09/15/19 1150  GLUCAP 166* 204* 120* 99 97   Lipid Profile: No results for input(s): CHOL, HDL, LDLCALC, TRIG, CHOLHDL, LDLDIRECT in the last 72 hours. Thyroid Function Tests: No results for input(s): TSH, T4TOTAL, FREET4, T3FREE, THYROIDAB in the last 72 hours. Anemia Panel: No results for input(s): VITAMINB12, FOLATE, FERRITIN, TIBC, IRON, RETICCTPCT in the last 72 hours. Sepsis Labs: Recent Labs  Lab 09/09/19  2303 09/10/19 0151  LATICACIDVEN 1.3 1.2    Recent Results (from the past 240 hour(s))  Culture, blood (routine x 2)     Status: None   Collection Time: 09/05/19  8:29 PM   Specimen: BLOOD  Result Value Ref Range Status   Specimen Description   Final    BLOOD LEFT HAND Performed at [facility] [address] [place], [place] [address]    Special Requests   Final    BOTTLES DRAWN AEROBIC AND ANAEROBIC Blood Culture adequate volume Performed at [facility] [address], [place], [place] [address]    Culture   Final    NO GROWTH 5 DAYS Performed at [facility], [place] [address], [place], [place] [address]    Report Status 09/11/2019 FINAL  Final  Urine culture     Status: Abnormal   Collection Time: 09/05/19  8:29 PM   Specimen: Urine, Random  Result Value Ref Range Status   Specimen Description   Final    URINE, RANDOM Performed at [facility] [address], [place], [place] [address]    Special Requests   Final    NONE Performed at [facility], [place] [address], [place], [place] [address]    Culture MULTIPLE SPECIES PRESENT, SUGGEST RECOLLECTION (A)  Final   Report Status 09/07/2019 FINAL  Final  Culture, blood (routine x 2)     Status: None   Collection Time: 09/05/19  8:34 PM   Specimen: BLOOD  Result Value Ref  Range Status   Specimen Description   Final    BLOOD RIGHT HAND Performed at [facility] [address] [place], [place] [address]    Special Requests   Final    BOTTLES DRAWN AEROBIC AND ANAEROBIC Blood Culture adequate volume Performed at [facility] [address], [place], [place] [address]    Culture   Final    NO GROWTH 5 DAYS Performed at [facility], [place] [address] [place], [place] [address]    Report Status 09/11/2019 FINAL  Final  Respiratory Panel by RT PCR (Flu A&B, Covid) - Nasopharyngeal Swab     Status: None   Collection Time: 09/06/19  8:10 AM   Specimen: Nasopharyngeal Swab  Result Value Ref Range Status   SARS Coronavirus 2 by RT PCR NEGATIVE NEGATIVE Final    Comment: (NOTE) SARS-CoV-2 target nucleic acids are NOT DETECTED. The SARS-CoV-2 RNA is generally detectable in upper respiratoy specimens during the acute phase of infection. The lowest concentration of SARS-CoV-2 viral copies this assay can detect is 131 copies/mL. A negative result does not preclude SARS-Cov-2 infection and should not be used as the sole basis for treatment or other patient management decisions. A negative result may occur with  improper specimen collection/handling, submission of specimen other than nasopharyngeal swab, presence of viral mutation(s) within the areas targeted by this assay, and inadequate number of viral copies (<131 copies/mL). A negative result must be combined with clinical observations, patient history, and epidemiological information. The expected result is Negative. Fact Sheet for Patients:  PinkCheek.be Fact Sheet for Healthcare Providers:  GravelBags.it This test is not yet ap proved or cleared by the [place] FDA and  has been authorized for detection and/or diagnosis of SARS-CoV-2 by FDA under an Emergency Use Authorization (EUA). This EUA will  remain  in effect (meaning this test can be used) for the duration of the  COVID-19 declaration under Section 564(b)(1) of the Act, 21 U.S.C. section 360bbb-3(b)(1), unless the authorization is terminated or revoked sooner.    Influenza A by PCR NEGATIVE NEGATIVE Final   Influenza B by PCR NEGATIVE NEGATIVE Final    Comment: (NOTE) The Xpert Xpress SARS-CoV-2/FLU/RSV assay is intended as an aid in  the diagnosis of influenza from Nasopharyngeal swab specimens and  should not be used as a sole basis for treatment. Nasal washings and  aspirates are unacceptable for Xpert Xpress SARS-CoV-2/FLU/RSV  testing. Fact Sheet for Patients: PinkCheek.be Fact Sheet for Healthcare Providers: GravelBags.it This test is not yet approved or cleared by the [place] FDA and  has been authorized for detection and/or diagnosis of SARS-CoV-2 by  FDA under an Emergency Use Authorization (EUA). This EUA will remain  in effect (meaning this test can be used) for the duration of the  Covid-19 declaration under Section 564(b)(1) of the Act, 21  U.S.C. section 360bbb-3(b)(1), unless the authorization is  terminated or revoked. Performed at [facility], [place] [address] [place], [place] [address]   Body fluid culture (includes gram stain)     Status: None   Collection Time: 09/07/19 11:51 AM   Specimen: Pleural Fluid  Result Value Ref Range Status   Specimen Description   Final    PLEURAL Performed at [facility] [address], [place], [place] [address]    Special Requests   Final    PLEURAL Performed at [facility], [place] [address] [place], [place] [address]    Gram Stain NO WBC SEEN NO ORGANISMS SEEN   Final   Culture   Final    NO GROWTH 3 DAYS Performed at [facility], [place] [address] [place], [place] [address]    Report Status 09/11/2019 FINAL  Final  MRSA PCR  Screening     Status: Abnormal   Collection Time: 09/07/19  2:22 PM   Specimen: Nasopharyngeal  Result Value Ref Range Status   MRSA by PCR POSITIVE (A) NEGATIVE Final    Comment:        The GeneXpert MRSA Assay (FDA approved for NASAL specimens only), is one component of a comprehensive MRSA colonization surveillance program. It is not intended to diagnose MRSA infection nor to guide or monitor treatment for MRSA infections. RESULT CALLED TO, READ BACK BY AND VERIFIED WITHKathyrn Lass [id number] @ [id number] Performed at [facility], [place] [address] [place], [place] [address]          Radiology Studies: DG Abd 2 Views  Result Date: 09/14/2019 CLINICAL DATA:  Nausea and vomiting EXAM: ABDOMEN - 2 VIEW COMPARISON:  None. FINDINGS: Scattered large and small bowel gas is noted. No abnormal mass or abnormal calcifications are seen. Mild retained fecal material is noted within the right colon. No free air is seen. Postsurgical changes are noted stable from the previous exam. IMPRESSION: Mild retained fecal material within the right colon. No other focal abnormality is noted. Electronically Signed   By: Inez Catalina M.D.   On: 09/14/2019 19:14        Scheduled Meds: . amLODipine  5 mg Oral Daily  . apixaban  2.5 mg Oral BID  . arformoterol  15 mcg Nebulization BID  . vitamin C  500 mg Oral BID  . aspirin EC  81 mg Oral Daily  . budesonide (PULMICORT) nebulizer solution  0.25 mg Nebulization BID  . Chlorhexidine Gluconate  Cloth  6 each Topical Daily  . docusate sodium  100 mg Oral BID  . escitalopram  20 mg Oral Daily  . ezetimibe  10 mg Oral Daily  . feeding supplement (PRO-STAT SUGAR FREE 64)  30 mL Oral TID BM  . ferrous sulfate  325 mg Oral BID WC  . folic acid  1 mg Oral Daily  . gabapentin  300 mg Oral QHS  . Gerhardt's butt cream   Topical BID  . hydrocerin   Topical BID  . insulin aspart  0-5 Units Subcutaneous QHS  . insulin aspart   0-9 Units Subcutaneous TID WC  . insulin glargine  10 Units Subcutaneous Daily  . levothyroxine  25 mcg Oral QAC breakfast  . magnesium oxide  400 mg Oral Daily  . multivitamin with minerals  1 tablet Oral Daily  . mupirocin ointment   Topical Daily  . nutrition supplement (JUVEN)  1 packet Oral BID BM  . pantoprazole  40 mg Oral Daily  . predniSONE  10 mg Oral Q breakfast  . rOPINIRole  0.5 mg Oral QHS  . rosuvastatin  5 mg Oral Daily   Continuous Infusions: . furosemide       LOS: 10 days        Hosie Poisson, MD [facility]

## 2019-09-15 NOTE — Plan of Care (Signed)
  Problem: Education: Goal: Knowledge of General Education information will improve Description Including pain rating scale, medication(s)/side effects and non-pharmacologic comfort measures Outcome: Progressing   Problem: Clinical Measurements: Goal: Respiratory complications will improve Outcome: Progressing Goal: Cardiovascular complication will be avoided Outcome: Progressing   Problem: Elimination: Goal: Will not experience complications related to bowel motility Outcome: Progressing Goal: Will not experience complications related to urinary retention Outcome: Progressing   Problem: Safety: Goal: Ability to remain free from injury will improve Outcome: Progressing   Problem: Skin Integrity: Goal: Risk for impaired skin integrity will decrease Outcome: Progressing   

## 2019-09-15 NOTE — Progress Notes (Signed)
Daily Progress Note   Patient Name: [name]       Date: 09/15/2019 DOB: 11/07/1945  Age: 74 y.o. MRN#: [id number] Attending Physician: Hosie Poisson, MD Primary Care Physician: Patient, No Pcp Per Admit Date: 09/05/2019  Reason for Consultation/Follow-up: Establishing goals of care and Non pain symptom management  Subjective:  [name] is asleep, resting comfortably in bed.  Does not arouse to gentle voice commands.  Discussed with bedside RN.  See below.  Length of Stay: 10  Current Medications: Scheduled Meds:  . amLODipine  5 mg Oral Daily  . apixaban  2.5 mg Oral BID  . arformoterol  15 mcg Nebulization BID  . vitamin C  500 mg Oral BID  . aspirin EC  81 mg Oral Daily  . budesonide (PULMICORT) nebulizer solution  0.25 mg Nebulization BID  . Chlorhexidine Gluconate Cloth  6 each Topical Daily  . docusate sodium  100 mg Oral BID  . escitalopram  20 mg Oral Daily  . ezetimibe  10 mg Oral Daily  . feeding supplement (PRO-STAT SUGAR FREE 64)  30 mL Oral TID BM  . ferrous sulfate  325 mg Oral BID WC  . folic acid  1 mg Oral Daily  . gabapentin  300 mg Oral QHS  . Gerhardt's butt cream   Topical BID  . hydrocerin   Topical BID  . insulin aspart  0-5 Units Subcutaneous QHS  . insulin aspart  0-9 Units Subcutaneous TID WC  . insulin glargine  10 Units Subcutaneous Daily  . levothyroxine  25 mcg Oral QAC breakfast  . magnesium oxide  400 mg Oral Daily  . multivitamin with minerals  1 tablet Oral Daily  . mupirocin ointment   Topical Daily  . nutrition supplement (JUVEN)  1 packet Oral BID BM  . pantoprazole  40 mg Oral Daily  . predniSONE  10 mg Oral Q breakfast  . rOPINIRole  0.5 mg Oral QHS  . rosuvastatin  5 mg Oral Daily    Continuous Infusions: . furosemide       PRN Meds: fentaNYL (SUBLIMAZE) injection, guaiFENesin, ipratropium-albuterol, lidocaine, nitroGLYCERIN, ondansetron (ZOFRAN) IV, oxyCODONE  Physical Exam         74 year old gentleman with obesity, on supplemental oxygen. Currently asleep, does not arouse to gentle voice commands. Regular pattern  of respirations. Abdomen with generalized distention. Has lower extremity edema. Chart review notes that he has stage II sacral ulcer. Not displaying nonverbal gestures of distress or discomfort currently.  Reportedly was complaining of mild pain earlier and refused oral medications this morning.  Vital Signs: BP 110/60 (BP Location: Left Arm)   Pulse 63   Temp (!) 97.2 F (36.2 C) (Oral)   Resp 16   Ht 6\' 2"  (1.88 m)   Wt (!) 156 kg   SpO2 98%   BMI 44.16 kg/m  SpO2: SpO2: 98 % O2 Device: O2 Device: Nasal Cannula O2 Flow Rate: O2 Flow Rate (L/min): 2 L/min  Intake/output summary:   Intake/Output Summary (Last 24 hours) at 09/15/2019 1022 Last data filed at 09/15/2019 0600 Gross per 24 hour  Intake 300 ml  Output 350 ml  Net -50 ml   LBM: Last BM Date: 09/14/19 Baseline Weight: Weight: (!) 157 kg Most recent weight: Weight: (!) 156 kg       Palliative Assessment/Data:      Patient Active Problem List   Diagnosis Date Noted  . Palliative care by specialist   . Goals of care, counseling/discussion   . Generalized weakness   . Acute on chronic respiratory failure with hypoxia ([place]) 09/05/2019  . Pleural effusion 09/05/2019  . Acute respiratory failure with hypoxia ([place]) 09/05/2019  . Hypercapnic respiratory failure ([place]) 09/05/2019  . Medically noncompliant 06/08/2018  . Chronic pain syndrome 06/08/2018  . Intractable nausea and vomiting 05/29/2018  . Sacral decubitus ulcer 05/29/2018  . History of pulmonary embolism 05/29/2018  . History of ESBL E. coli infection 05/29/2018  . History of MRSA infection 05/29/2018  . Acute adrenal insufficiency ([place]) 05/29/2018  .  Cellulitis of buttock 05/29/2018  . Boil 09/08/2017  . Trigger finger 09/08/2017  . Acute pulmonary embolism ([place]) 09/07/2017  . Pressure injury of skin 09/06/2017  . Chest pain 09/03/2017  . Hyperkalemia 09/03/2017  . Allergic rhinitis 08/19/2017  . Anemia 06/25/2017  . Cellulitis of left hip 05/27/2017  . Acute cystitis without hematuria   . Dyslipidemia associated with type 2 diabetes mellitus ([place]) 01/17/2017  . 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 Hypertensive heart disease with CHF (congestive heart failure) ([place]) 04/15/2016  . H/O: CVA (cerebrovascular accident) 01/22/2016  . Sepsis ([place]) 01/11/2016  . Depression   . Syncope 11/01/2015  . GERD (gastroesophageal reflux disease) 10/22/2015  . Restless legs 10/22/2015  . CAD in native artery 09/15/2015  . Morbid obesity ([place]) 07/03/2015  . Uncontrolled type II diabetes with peripheral autonomic neuropathy ([place]) 05/09/2015  . Hyperlipidemia LDL goal <70 05/02/2015  . Neurogenic bladder 04/26/2015  . Catheter-associated urinary tract infection ([place]) 04/14/2015  . Abdominal pain 06/25/2014  . Chronic diastolic CHF (congestive heart failure) ([place]) 04/01/2014  . Acute on chronic diastolic CHF (congestive heart failure) ([place]) 03/21/2014  . Chronic indwelling Foley catheter 12/23/2013  . Hypokalemia 03/11/2012  . Diabetic neuropathy ([place]) 10/10/2011  . 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  Assessment: Patient has been admitted to hospital medicine service with acute on chronic respiratory failure secondary to bilateral effusions, moderate pericardial effusion.  Acute on chronic systolic and diastolic heart  failure. Hospital course also complicated by acute kidney injury,  acute metabolic encephalopathy, possible hypercapnia as well as hypoxia.  Ongoing periods of alertness and confusion, more sleepy since the past few days or so.  Palliative medicine team consulted and following for symptom management and goals of care discussions.  Patient complained of mouth pain and pain in the inside of his cheeks today.  Refused to take medications by mouth.  Recommendations/Plan:  Continue current mode of care.  Monitor overall hospital course and disease trajectory.  Ultimately, it may be that the patient will benefit from residential hospice towards the end of this hospitalization.  This has been discussed with patient's family by my colleague Dr. Domingo Cocking.  Add viscous lidocaine solution swish and swallow for mouth pain.   Code Status:    Code Status Orders  (From admission, onward)         Start     Ordered   09/05/19 2028  Do not attempt resuscitation (DNR)  Continuous    Question Answer Comment  In the event of cardiac or respiratory ARREST Do not call a "code blue"   In the event of cardiac or respiratory ARREST Do not perform Intubation, CPR, defibrillation or ACLS   In the event of cardiac or respiratory ARREST Use medication by any route, position, wound care, and other measures to relive pain and suffering. May use oxygen, suction and manual treatment of airway obstruction as needed for comfort.      09/05/19 2028        Code Status History    Date Active Date Inactive Code Status Order ID Comments User Context   09/05/2019 1904 09/05/2019 2028 DNR [id number]  Antonieta Pert, MD ED   05/29/2018 2107 05/31/2018 1612 DNR [id number]  Etta Quill, DO ED   09/06/2017 0911 09/08/2017 2135 DNR [id number]  Jani Gravel, MD Inpatient   09/03/2017 0742 09/06/2017 0911 Full Code [id number]  Rondel Jumbo, PA-C ED   05/27/2017 2004 06/05/2017 1807 Full Code [id number]  Ivor Costa, MD ED   05/27/2016  1247 06/02/2016 1725 Full Code [id number]  Samella Parr, NP Inpatient   01/11/2016 1934 01/16/2016 1736 DNR [id number]  Ivor Costa, MD ED   01/11/2016 1631 01/11/2016 1934 DNR [id number]  Carmin Muskrat, MD ED   10/08/2015 0856 10/14/2015 1724 Full Code [id number]  Rama, Venetia Maxon, MD ED   09/23/2015 0207 09/25/2015 2142 Full Code [id number]  Ivor Costa, MD ED   09/07/2015 1559 09/15/2015 2205 Full Code [id number]  Theodis Blaze, MD ED   07/01/2015 1838 07/06/2015 1959 Full Code [id number]  Elmarie Shiley, MD Inpatient   05/04/2015 1703 05/06/2015 2048 Full Code [id number]  Wellington Hampshire, MD Inpatient   05/02/2015 1523 05/04/2015 1703 Full Code [id number]  Lorretta Harp, MD Inpatient   04/30/2015 0209 05/02/2015 1523 Full Code [id number]  Sid Falcon, MD Inpatient   04/14/2015 1605 04/18/2015 1743 Full Code [id number]  Elgergawy, Silver Huguenin, MD Inpatient   12/07/2014 2155 12/09/2014 1720 Full Code [id number]  Barrett, Evelene Croon, PA-C Inpatient   11/15/2014 0156 11/19/2014 1617 Full Code [id number]  Allie Bossier, MD ED   11/10/2014 1709 11/11/2014 1738 Full Code [id number]  Samella Parr, NP Inpatient   08/27/2014 2116 08/31/2014 2049 Full Code [id number]  Elmarie Shiley, MD Inpatient   06/25/2014 0111 06/29/2014 1641 Full Code [id number]  Shanda Howells, MD ED   04/01/2014 1843 04/03/2014 1802 Full Code [id number]  Orson Eva, MD Inpatient   03/21/2014 1907 03/24/2014 1341 Full Code [id number]  [place]hi,  Rae Lips, MD Inpatient   12/23/2013 1356 12/26/2013 1922 DNR [id number]  Karen Kitchens Inpatient   03/28/2012 0121 03/30/2012 1513 Full Code [id number]  Arlyss Repress., MD ED   03/28/2012 0116 03/28/2012 0121 Full Code [id number]  Arlyss Repress., MD ED   11/16/2011 0000 11/20/2011 1226 Full Code [id number]  Georgeanna Harrison, RN Inpatient   11/12/2011 1632 11/14/2011 1939 Full Code [id number]  Curtis Sites, RN Inpatient   Advance Care Planning Activity       Prognosis:  guarded.   Discharge Planning:  To Be Determined  Care  plan was discussed with  Bedside RN.   Thank you for allowing the Palliative Medicine Team to assist in the care of this patient.   Time In: 9 Time Out: 9.25 Total Time 25 Prolonged Time Billed  no       Greater than 50%  of this time was spent counseling and coordinating care related to the above assessment and plan.  Loistine Chance, MD  Please contact Palliative Medicine Team phone at [phone] for questions and concerns.

## 2019-09-15 NOTE — Progress Notes (Signed)
Compression wraps changed on pt legs and Eucerin cream applied per order. Gerhardtts applied to buttocks, Bactroban to toes. Pt more alert and interactive this afternoon. Pt with poor PO intake throughout the day.

## 2019-09-16 LAB — GLUCOSE, CAPILLARY
Glucose-Capillary: 118 mg/dL — ABNORMAL HIGH (ref 70–99)
Glucose-Capillary: 130 mg/dL — ABNORMAL HIGH (ref 70–99)
Glucose-Capillary: 148 mg/dL — ABNORMAL HIGH (ref 70–99)
Glucose-Capillary: 174 mg/dL — ABNORMAL HIGH (ref 70–99)

## 2019-09-16 LAB — SARS CORONAVIRUS 2 (TAT 6-24 HRS): SARS Coronavirus 2: NEGATIVE

## 2019-09-16 NOTE — Progress Notes (Signed)
Daily Progress Note   Patient Name: [name]       Date: 09/16/2019 DOB: [birth year]  Age: 74 y.o. MRN#: [id number] Attending Physician: [place] Brian J, DO Primary Care Physician: Patient, No Pcp Per Admit Date: 09/05/2019  Reason for Consultation/Follow-up: Establishing goals of care and Non pain symptom management  Subjective:  [name] continues to appear tired, fatigued.  Resting in bed.  Generalized edema.  Call placed and discussed in detail with brother [name], see below Length of Stay: 11  Current Medications: Scheduled Meds:  . apixaban  2.5 mg Oral BID  . arformoterol  15 mcg Nebulization BID  . budesonide (PULMICORT) nebulizer solution  0.25 mg Nebulization BID  . Chlorhexidine Gluconate Cloth  6 each Topical Daily  . docusate sodium  100 mg Oral BID  . escitalopram  20 mg Oral Daily  . feeding supplement (PRO-STAT SUGAR FREE 64)  30 mL Oral TID BM  . gabapentin  300 mg Oral QHS  . Gerhardt's butt cream   Topical BID  . hydrocerin   Topical BID  . levothyroxine  25 mcg Oral QAC breakfast  . magnesium oxide  400 mg Oral Daily  . mupirocin ointment   Topical Daily  . nutrition supplement (JUVEN)  1 packet Oral BID BM  . pantoprazole  40 mg Oral Daily  . predniSONE  10 mg Oral Q breakfast  . rOPINIRole  0.5 mg Oral QHS    Continuous Infusions:   PRN Meds: fentaNYL (SUBLIMAZE) injection, guaiFENesin, ipratropium-albuterol, lidocaine, nitroGLYCERIN, ondansetron (ZOFRAN) IV, oxyCODONE  Physical Exam         74 year old gentleman with obesity, on supplemental oxygen. Appears tired. Doesn't interact much Regular pattern of respirations. Abdomen with generalized distention. Has lower extremity edema. Chart review notes that he has stage II sacral  ulcer.  Vital Signs: BP (!) 92/54 (BP Location: Left Arm)   Pulse 68   Temp 97.9 F (36.6 C) (Oral)   Resp 18   Ht 6\' 2"  (1.88 m)   Wt (!) 156 kg   SpO2 100%   BMI 44.16 kg/m  SpO2: SpO2: 100 % O2 Device: O2 Device: Nasal Cannula O2 Flow Rate: O2 Flow Rate (L/min): 2 L/min  Intake/output summary:   Intake/Output Summary (Last 24 hours) at 09/16/2019 1556 Last data filed at  09/16/2019 0818 Gross per 24 hour  Intake 180 ml  Output 150 ml  Net 30 ml   LBM: Last BM Date: 09/15/19 Baseline Weight: Weight: (!) 157 kg Most recent weight: Weight: (!) 156 kg       Palliative Assessment/Data:    Flowsheet Rows     Most Recent Value  Intake Tab  Referral Department  Hospitalist  Unit at Time of Referral  ER  Palliative Care Primary Diagnosis  Pulmonary  Date Notified  09/06/19  Palliative Care Type  New Palliative care  Reason for referral  Clarify Goals of Care  Date of Admission  09/05/19  Date first seen by Palliative Care  09/07/19  # of days Palliative referral response time  1 Day(s)  # of days IP prior to Palliative referral  1  Clinical Assessment  Psychosocial & Spiritual Assessment  Palliative Care Outcomes      Patient Active Problem List   Diagnosis Date Noted  . Palliative care by specialist   . Goals of care, counseling/discussion   . Generalized weakness   . Acute on chronic respiratory failure with hypoxia ([place]) 09/05/2019  . Pleural effusion 09/05/2019  . Acute respiratory failure with hypoxia ([place]) 09/05/2019  . Hypercapnic respiratory failure ([place]) 09/05/2019  . Medically noncompliant 06/08/2018  . Chronic pain syndrome 06/08/2018  . Intractable nausea and vomiting 05/29/2018  . Sacral decubitus ulcer 05/29/2018  . History of pulmonary embolism 05/29/2018  . History of ESBL E. coli infection 05/29/2018  . History of MRSA infection 05/29/2018  . Acute adrenal insufficiency ([place]) 05/29/2018  . Cellulitis of buttock 05/29/2018  . Boil 09/08/2017   . Trigger finger 09/08/2017  . Acute pulmonary embolism ([place]) 09/07/2017  . Pressure injury of skin 09/06/2017  . Chest pain 09/03/2017  . Hyperkalemia 09/03/2017  . Allergic rhinitis 08/19/2017  . Anemia 06/25/2017  . Cellulitis of left hip 05/27/2017  . Acute cystitis without hematuria   . Dyslipidemia associated with type 2 diabetes mellitus ([place]) 01/17/2017  . 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 Hypertensive heart disease with CHF (congestive heart failure) ([place]) 04/15/2016  . H/O: CVA (cerebrovascular accident) 01/22/2016  . Sepsis ([place]) 01/11/2016  . Depression   . Syncope 11/01/2015  . GERD (gastroesophageal reflux disease) 10/22/2015  . Restless legs 10/22/2015  . CAD in native artery 09/15/2015  . Morbid obesity ([place]) 07/03/2015  . Uncontrolled type II diabetes with peripheral autonomic neuropathy ([place]) 05/09/2015  . Hyperlipidemia LDL goal <70 05/02/2015  . Neurogenic bladder 04/26/2015  . Catheter-associated urinary tract infection ([place]) 04/14/2015  . Abdominal pain 06/25/2014  . Chronic diastolic CHF (congestive heart failure) ([place]) 04/01/2014  . Acute on chronic diastolic CHF (congestive heart failure) ([place]) 03/21/2014  . Chronic indwelling Foley catheter 12/23/2013  . Hypokalemia 03/11/2012  . Diabetic neuropathy ([place]) 10/10/2011  . 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  Assessment: Patient has been admitted to hospital medicine service with acute on chronic respiratory failure secondary to bilateral effusions, moderate pericardial effusion.  Acute on chronic systolic and diastolic heart failure. Hospital course also complicated by acute kidney  injury, acute metabolic encephalopathy, possible hypercapnia as well as hypoxia.  Ongoing periods of alertness and confusion, more sleepy since the past few  days or so.  Palliative medicine team consulted and following for symptom management and goals of care discussions.  Patient complained of mouth pain and pain in the inside of his cheeks today.  Refused to take medications by mouth.  Recommendations/Plan:  Continue current mode of care. Monitor overall hospital course and disease trajectory.  Ultimately, it may be that the patient will benefit from residential hospice towards the end of this hospitalization.   Call placed and discussed with patient's brother Linna Hoff.  We reviewed about his current hospitalization.  We recalled conversations that have occurred between palliative care and patient and family thus far.  Decline trajectory from heart failure as well as cardiorenal syndrome effects discussed.  Discussed about difficulty in prognosticating end stages of heart failure.  Goals wishes and values attempted to be elicited.  Disposition options of discharge back to [place] with palliative versus residential hospice discussed in detail. Scope of comfort measures only, the type of care that can be given in a residential hospice setting discussed in detail with the patient's brother Linna Hoff.  He is aware.  He has been able to see the patient's picture and notes that the patient appears fatigued and weak and also has sunken eyes.  He has worsening generalized edema as well.  Patient reportedly told his brother over the phone that he is simply feeling tired and wishes to be left alone.  At times he has also been confused.  Patient's brother Linna Hoff is concerned that the patient is likely approaching end-of-life and would benefit from compassionate care that can be provided in a residential hospice setting.    We discussed about monitoring the patient's hospital course for at least another 24 more hours,  repeating blood work to check his kidney function again in the morning as well as to reattempt explaining to the patient about comfort measures/hospice approach.  Overall, it appears more prudent to consider a discharge plan that focuses on comfort measures and transition to hospice setting.  Palliative to follow-up again in a.m.  Code Status:    Code Status Orders  (From admission, onward)         Start     Ordered   09/05/19 2028  Do not attempt resuscitation (DNR)  Continuous    Question Answer Comment  In the event of cardiac or respiratory ARREST Do not call a "code blue"   In the event of cardiac or respiratory ARREST Do not perform Intubation, CPR, defibrillation or ACLS   In the event of cardiac or respiratory ARREST Use medication by any route, position, wound care, and other measures to relive pain and suffering. May use oxygen, suction and manual treatment of airway obstruction as needed for comfort.      09/05/19 2028        Code Status History    Date Active Date Inactive Code Status Order ID Comments User Context   09/05/2019 1904 09/05/2019 2028 DNR [id number]  Antonieta Pert, MD ED   05/29/2018 2107 05/31/2018 1612 DNR [id number]  Etta Quill, DO ED   09/06/2017 0911 09/08/2017 2135 DNR [id number]  Jani Gravel, MD Inpatient   09/03/2017 0742 09/06/2017 0911 Full Code [id number]  Rondel Jumbo, PA-C ED   05/27/2017 2004 06/05/2017 1807 Full Code [id number]  Ivor Costa, MD ED   05/27/2016 1247 06/02/2016 1725 Full Code [id number]  Samella Parr, NP Inpatient   01/11/2016 1934 01/16/2016 1736 DNR [id number]  Ivor Costa, MD ED   01/11/2016 1631 01/11/2016 1934  DNR [id number]  Carmin Muskrat, MD ED   10/08/2015 0856 10/14/2015 1724 Full Code [id number]  Rama, Venetia Maxon, MD ED   09/23/2015 0207 09/25/2015 2142 Full Code [id number]  Ivor Costa, MD ED   09/07/2015 1559 09/15/2015 2205 Full Code [id number]  Theodis Blaze, MD ED   07/01/2015 1838 07/06/2015 1959 Full Code [id number]  Elmarie Shiley, MD Inpatient   05/04/2015 1703 05/06/2015 2048 Full Code [id number]  Wellington Hampshire, MD Inpatient   05/02/2015 1523 05/04/2015 1703 Full Code [id number]  Lorretta Harp, MD Inpatient   04/30/2015 0209 05/02/2015 1523 Full Code [id number]  Sid Falcon, MD Inpatient   04/14/2015 1605 04/18/2015 1743 Full Code [id number]  Elgergawy, Silver Huguenin, MD Inpatient   12/07/2014 2155 12/09/2014 1720 Full Code [id number]  Barrett, Evelene Croon, PA-C Inpatient   11/15/2014 0156 11/19/2014 1617 Full Code [id number]  Allie Bossier, MD ED   11/10/2014 1709 11/11/2014 1738 Full Code [id number]  Samella Parr, NP Inpatient   08/27/2014 2116 08/31/2014 2049 Full Code [id number]  Elmarie Shiley, MD Inpatient   06/25/2014 0111 06/29/2014 1641 Full Code [id number]  Shanda Howells, MD ED   04/01/2014 1843 04/03/2014 1802 Full Code [id number]  Orson Eva, MD Inpatient   03/21/2014 1907 03/24/2014 1341 Full Code [id number]  Verlee Monte, MD Inpatient   12/23/2013 1356 12/26/2013 1922 DNR [id number]  Melton Alar, PA-C Inpatient   03/28/2012 0121 03/30/2012 1513 Full Code [id number]  Arlyss Repress., MD ED   03/28/2012 0116 03/28/2012 0121 Full Code [id number]  Arlyss Repress., MD ED   11/16/2011 0000 11/20/2011 1226 Full Code [id number]  Georgeanna Harrison, RN Inpatient   11/12/2011 1632 11/14/2011 1939 Full Code [id number]  Curtis Sites, RN Inpatient   Advance Care Planning Activity       Prognosis:  guarded.   Discharge Planning:  To Be Determined  Care plan was discussed with  Brother on the phone.   Thank you for allowing the Palliative Medicine Team to assist in the care of this patient.   Time In: 1500 Time Out: 1535 Total Time 35 Prolonged Time Billed  no       Greater than 50%  of this time was spent counseling and coordinating care related to the above assessment and plan.  Loistine Chance, MD  Please contact Palliative Medicine Team phone at [phone] for questions and concerns.

## 2019-09-16 NOTE — Progress Notes (Signed)
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 CT of the chest showed bilateral pleural effusion concerning for pulmonary edema with pericardial effusion. He was diuresed appropriately also underwent thoracentesis with minimal improvement. 2D echocardiogram showed new very depressed left ventricular ejection fraction of about 40% with apical wall motion abnormality. Cardiology consulted and he was diuresed appropriately however his creatinine continued to get worse and Lasix was discontinued. Nephrology consulted for AKI from diuresis suggested patient is not a hemodialysis candidate recommended comfort care.  Palliative care consulted and on further discussions with the family transitioned the patient to supportive care and  plan to discharge to residential hospice in 1 to 2 days, depending on his progress.  Patient has been refusing meds the last 2 days as per the RN hence we discontinued telemetry and cholesterol medication, multivitamins and iron tablets, nonessential medications after discussing with his brother.  Patient did not want any lab draws or insulin injections at this time. Discussed with the patient's brother and discontinued lab draws and sliding scale insulin and Lantus.   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  Patient was aggressively diuresed earlier in the hospitalization with optimal improvement of his underlying respiratory distress; unfortunately leading to cardiorenal syndrome with acute renal failure.  Nephrology was consulted for the acute renal failure stemming from the aggressive diuresis and recommended patient not a candidate for hemodialysis and suggested comfort measures.  Palliative care was consulted after further discussions with family, plan for likely residential hospice upon bed availability.  Acute metabolic encephalopathy probably secondary to hypercapnia and acute respiratory failure with hypoxia. Patient is alert has memory deficits and is confused. Patient continues to refuse meds on occasion per nursing staff  Type 2 diabetes mellitus with peripheral neuropathy and chronic kidney disease Discontinued sliding scale insulin and Lantus as been refusing monitoring/fingersticks, now likely transitioning to residential hospice  History of bullous pemphigoid Continue with prednisone 10 mg daily and PPI for GI prophylaxis.  History of neurogenic bladder with indwelling Foley catheter and history of ESBL Continue with Foley catheter.  No symptoms of dysuria at this time. Urine output has been 188mL past 24 hours.  History of pulmonary  embolism --Continue Eliquis  History of chronic ambulatory dysfunction with chronic pain syndrome along with the  restless leg syndrome --Continue with Requip. --Continue with pain medications.  History of anxiety and depression --Continue  Lexapro.  Hypothyroidism:  --Continue Synthroid.  Anemia of chronic disease --Hemoglobin stable around 9.  No new blood draws at this time.  Nausea and some vomiting/spitting up --Abdominal x-ray does not show any obstruction at this time.  Pressure injury, present on admission Pressure Injury 09/06/19 Toe (Comment  which one) Right Stage 2 -  Partial thickness loss of dermis presenting as a shallow open injury with a red, pink wound bed without slough. (Active)  09/06/19 1804  Location: Toe (Comment  which one) (2nd toe )  Location Orientation: Right  Staging: Stage 2 -  Partial thickness loss of dermis presenting as a shallow open injury with a red, pink wound bed without slough.  Wound Description (Comments):   Present on Admission: Yes     Pressure Injury 09/06/19 Toe (Comment  which one) Anterior;Left Unstageable - Full thickness tissue loss in which the base of the injury is covered by slough (yellow, tan, gray, green or brown) and/or eschar (tan, brown or black) in the wound bed. (Active)  09/06/19 1806  Location: Toe (Comment  which one) (2nd toe)  Location Orientation: Anterior;Left  Staging: Unstageable - Full thickness tissue loss in which the base of the injury is covered by slough (yellow, tan, gray, green or brown) and/or eschar (tan, brown or black) in the wound bed.  Wound Description (Comments):   Present on Admission: Yes        DVT prophylaxis: Eliquis Code Status: DNR Family Communication: Discussed with patient, none present at bedside today Disposition Plan: Likely residential hospice, hopeful discharge tomorrow   Consultants:   Palliative care  Cardiology  Nephrology  Procedures:   Ultrasound-guided  thoracentesis  Antimicrobials:   Doxycycline 12/28 -12/31  Vancomycin 12/27 - 12/28  Cefepime 12/26 - 12/28   Subjective: Patient seen and examined bedside, pleasantly confused.  No complaints this morning.  No family members present at bedside.  Denies headache, no chest pain, no palpitations, no shortness of breath, no abdominal pain.  No acute events overnight per nursing staff.  Objective: Vitals:   09/15/19 1956 09/15/19 2042 09/16/19 0822 09/16/19 1352  BP:  (!) 118/93  (!) 92/54  Pulse:  67  68  Resp:  (!) 22  18  Temp:  98 F (36.7 C)  97.9 F (36.6 C)  TempSrc:    Oral  SpO2: 96% 97% 100% 100%  Weight:      Height:        Intake/Output Summary (Last 24 hours) at 09/16/2019 1824 Last data filed at 09/16/2019 1751 Gross per 24 hour  Intake 660 ml  Output --  Net 660 ml   Filed Weights   09/12/19 0500 09/13/19 0553 09/14/19 0640  Weight: (!) 215 kg (!) 218 kg (!) 156 kg    Examination:  General exam: Appears calm and comfortable, pleasantly confused, chronically ill in appearance, obese Respiratory system: Decreased breath sounds bilateral bases, no wheezing/crackles, normal respiratory effort, on 2 L nasal cannula oxygenating 97% Cardiovascular system: S1 & S2 heard, RRR. No JVD, murmurs, rubs, gallops or clicks.  3+ pitting edema bilateral lower extremities Gastrointestinal system: Abdomen is nondistended, soft and nontender. No organomegaly or masses felt. Normal bowel sounds heard. Central nervous system: Alert, confused. No focal neurological deficits. Extremities: Symmetric 5 x 5 power. Skin: Stage II sacral pressure ulcer Psychiatry: Patient confused, unable to assess any further due to his current  mental status    Data Reviewed: I have personally reviewed following labs and imaging studies  CBC: Recent Labs  Lab 09/10/19 0151 09/11/19 0426 09/12/19 0441 09/13/19 0524  WBC 11.4* 10.5 11.2* 11.0*  NEUTROABS 9.4*  --   --   --   HGB 9.8* 8.8*  9.1* 9.4*  HCT 32.1* 28.7* 29.4* 30.6*  MCV 90.7 89.4 88.3 89.7  PLT 223 208 184 786   Basic Metabolic Panel: Recent Labs  Lab 09/10/19 0151 09/11/19 0426 09/12/19 0441 09/14/19 0508  NA 123* 124* 122*  --   K 5.3* 5.3* 5.2*  --   CL 87* 89* 85*  --   CO2 25 25 25   --   GLUCOSE 186* 206* 169*  --   BUN 65* 74* 87*  --   CREATININE 2.66* 2.97* 3.12*  3.11* 3.31*  CALCIUM 7.8* 7.8* 7.7*  --    GFR: Estimated Creatinine Clearance: 32.3 mL/min (A) (by C-G formula based on SCr of 3.31 mg/dL (H)). Liver Function Tests: Recent Labs  Lab 09/10/19 0151  AST 15  ALT 13  ALKPHOS 96  BILITOT 0.7  PROT 5.2*  ALBUMIN 2.5*   No results for input(s): LIPASE, AMYLASE in the last 168 hours. No results for input(s): AMMONIA in the last 168 hours. Coagulation Profile: No results for input(s): INR, PROTIME in the last 168 hours. Cardiac Enzymes: No results for input(s): CKTOTAL, CKMB, CKMBINDEX, TROPONINI in the last 168 hours. BNP (last 3 results) No results for input(s): PROBNP in the last 8760 hours. HbA1C: No results for input(s): HGBA1C in the last 72 hours. CBG: Recent Labs  Lab 09/15/19 1641 09/15/19 2038 09/16/19 0744 09/16/19 1118 09/16/19 1642  GLUCAP 98 104* 118* 130* 148*   Lipid Profile: No results for input(s): CHOL, HDL, LDLCALC, TRIG, CHOLHDL, LDLDIRECT in the last 72 hours. Thyroid Function Tests: No results for input(s): TSH, T4TOTAL, FREET4, T3FREE, THYROIDAB in the last 72 hours. Anemia Panel: No results for input(s): VITAMINB12, FOLATE, FERRITIN, TIBC, IRON, RETICCTPCT in the last 72 hours. Sepsis Labs: Recent Labs  Lab 09/09/19 2303 09/10/19 0151  LATICACIDVEN 1.3 1.2    Recent Results (from the past 240 hour(s))  Body fluid culture (includes gram stain)     Status: None   Collection Time: 09/07/19 11:51 AM   Specimen: Pleural Fluid  Result Value Ref Range Status   Specimen Description   Final    PLEURAL Performed at [facility] [address], [place], [place] [address]    Special Requests   Final    PLEURAL Performed at [facility], [place] [address], [place], [place] [address]    Gram Stain NO WBC SEEN NO ORGANISMS SEEN   Final   Culture   Final    NO GROWTH 3 DAYS Performed at [facility], [place] [address], [place], [place] [address]    Report Status 09/11/2019 FINAL  Final  MRSA PCR Screening     Status: Abnormal   Collection Time: 09/07/19  2:22 PM   Specimen: Nasopharyngeal  Result Value Ref Range Status   MRSA by PCR POSITIVE (A) NEGATIVE Final    Comment:        The GeneXpert MRSA Assay (FDA approved for NASAL specimens only), is one component of a comprehensive MRSA colonization surveillance program. It is not intended to diagnose MRSA infection nor to guide or monitor treatment for MRSA infections. RESULT CALLED TO, READ BACK BY AND VERIFIED WITH: Mel Almond  [facility] [id number] @ [id number] Performed at [facility], [place] [address] [place], [place] [address]          Radiology Studies: DG Abd 2 Views  Result Date: 09/14/2019 CLINICAL DATA:  Nausea and vomiting EXAM: ABDOMEN - 2 VIEW COMPARISON:  None. FINDINGS: Scattered large and small bowel gas is noted. No abnormal mass or abnormal calcifications are seen. Mild retained fecal material is noted within the right colon. No free air is seen. Postsurgical changes are noted stable from the previous exam. IMPRESSION: Mild retained fecal material within the right colon. No other focal abnormality is noted. Electronically Signed   By: Inez Catalina M.D.   On: 09/14/2019 19:14        Scheduled Meds: . apixaban  2.5 mg Oral BID  . arformoterol  15 mcg Nebulization BID  . budesonide (PULMICORT) nebulizer solution  0.25 mg Nebulization BID  . Chlorhexidine Gluconate Cloth  6 each Topical Daily  . docusate sodium  100 mg Oral BID  . escitalopram  20 mg Oral Daily  . feeding  supplement (PRO-STAT SUGAR FREE 64)  30 mL Oral TID BM  . gabapentin  300 mg Oral QHS  . Gerhardt's butt cream   Topical BID  . hydrocerin   Topical BID  . levothyroxine  25 mcg Oral QAC breakfast  . magnesium oxide  400 mg Oral Daily  . mupirocin ointment   Topical Daily  . nutrition supplement (JUVEN)  1 packet Oral BID BM  . pantoprazole  40 mg Oral Daily  . predniSONE  10 mg Oral Q breakfast  . rOPINIRole  0.5 mg Oral QHS   Continuous Infusions:   LOS: 11 days    Time spent: 37 minutes spent on chart review, discussion with nursing staff, consultants, updating family and interview/physical exam; more than 50% of that time was spent in counseling and/or coordination of care.    Syed Zukas J [place] DO [facility] 09/16/2019, 6:24 PM

## 2019-09-16 NOTE — TOC Progression Note (Signed)
Transition of Care [facility]) - Progression Note    Patient Details  Name: [name] MRN: [id number] Date of Birth: 1945/12/30  Transition of Care [facility]) CM/SW Contact  Dyonna Jaspers, Juliann Pulse, RN Phone Number: 09/16/2019, 2:09 PM  Clinical Narrative: For d/c in am if stable Back to [place] confirmed w/rep Alison-no new covid needed. fl2 sent.      Expected Discharge Plan: [place]    Expected Discharge Plan and Services Expected Discharge Plan: [place]                                               Social Determinants of Health (SDOH) Interventions    Readmission Risk Interventions No flowsheet data found.

## 2019-09-16 NOTE — Progress Notes (Signed)
Attempted to assist patient to call family x3 times. RN had spoken to patient's sister in law and she asked [place] to try after 2pm. No answer at number given x3 attempts.

## 2019-09-16 NOTE — TOC Progression Note (Signed)
Transition of Care [facility]) - Progression Note    Patient Details  Name: [name] MRN: [id number] Date of Birth: October 25, 1945  Transition of Care [facility]) CM/SW Contact  Danille Oppedisano, Juliann Pulse, RN Phone Number: 09/16/2019, 3:22 PM  Clinical Narrative: Will await final d/c plan for possible residential hospice.     Expected Discharge Plan: [place]    Expected Discharge Plan and Services Expected Discharge Plan: [place]                                               Social Determinants of Health (SDOH) Interventions    Readmission Risk Interventions No flowsheet data found.

## 2019-09-16 NOTE — NC FL2 (Signed)
[place] LEVEL OF CARE SCREENING TOOL     IDENTIFICATION  Patient Name: [name] Birthdate: [birth year] Sex: male Admission Date (Current Location): 09/05/2019  [facility] and [place] Number:  Herbalist and Address:  [facility],  [place], [place]      Provider Number: [id number]  Attending Physician Name and Address:  [place] Eric J, DO  Relative Name and Phone Number:  Dennies Coate [phone]    Current Level of Care: Hospital Recommended Level of Care: [place] Prior Approval Number:    Date Approved/Denied:   PASRR Number: [id number]  Discharge Plan: SNF    Current Diagnoses: Patient Active Problem List   Diagnosis Date Noted  . Palliative care by specialist   . Goals of care, counseling/discussion   . Generalized weakness   . Acute on chronic respiratory failure with hypoxia ([place]) 09/05/2019  . Pleural effusion 09/05/2019  . Acute respiratory failure with hypoxia ([place]) 09/05/2019  . Hypercapnic respiratory failure ([place]) 09/05/2019  . Medically noncompliant 06/08/2018  . Chronic pain syndrome 06/08/2018  . Intractable nausea and vomiting 05/29/2018  . Sacral decubitus ulcer 05/29/2018  . History of pulmonary embolism 05/29/2018  . History of ESBL E. coli infection 05/29/2018  . History of MRSA infection 05/29/2018  . Acute adrenal insufficiency ([place]) 05/29/2018  . Cellulitis of buttock 05/29/2018  . Boil 09/08/2017  . Trigger finger 09/08/2017  . Acute pulmonary embolism ([place]) 09/07/2017  . Pressure injury of skin 09/06/2017  . Chest pain 09/03/2017  . Hyperkalemia 09/03/2017  . Allergic rhinitis 08/19/2017  . Anemia 06/25/2017  . Cellulitis of left hip 05/27/2017  . Acute cystitis without hematuria   . Dyslipidemia associated with type 2 diabetes mellitus ([place]) 01/17/2017  . 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 Hypertensive heart disease with CHF (congestive heart failure) ([place])  04/15/2016  . H/O: CVA (cerebrovascular accident) 01/22/2016  . Sepsis ([place]) 01/11/2016  . Depression   . Syncope 11/01/2015  . GERD (gastroesophageal reflux disease) 10/22/2015  . Restless legs 10/22/2015  . CAD in native artery 09/15/2015  . Morbid obesity ([place]) 07/03/2015  . 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 Hyperlipidemia LDL goal <70 05/02/2015  . Neurogenic bladder 04/26/2015  . Catheter-associated urinary tract infection ([place]) 04/14/2015  . Abdominal pain 06/25/2014  . Chronic diastolic CHF (congestive heart failure) ([place]) 04/01/2014  . Acute on chronic diastolic CHF (congestive heart failure) ([place]) 03/21/2014  . Chronic indwelling Foley catheter 12/23/2013  . Hypokalemia 03/11/2012  . Diabetic neuropathy ([place]) 10/10/2011  . OSA (obstructive sleep apnea) 10/10/2011    Orientation RESPIRATION BLADDER Height & Weight     Self, Situation  O2(02 2lnc) Incontinent, Indwelling catheter Weight: (!) 156 kg Height:  6\' 2"  (188 cm)  BEHAVIORAL SYMPTOMS/MOOD NEUROLOGICAL BOWEL NUTRITION STATUS      Continent Diet(Heart Healthy)  AMBULATORY STATUS COMMUNICATION OF NEEDS Skin   Total Care Verbally Skin abrasions, Other (Comment)(buttock wound/moisture-cream, L great toe wound-foam;bilateral leg compression wraps)                       Personal Care Assistance Level of Assistance  Bathing, Feeding, Dressing Bathing Assistance: Maximum assistance Feeding assistance: Maximum assistance Dressing Assistance: Maximum assistance     Functional Limitations Info  Sight, Hearing, Speech Sight Info: Impaired(eyeglasses) Hearing Info: Adequate Speech Info: Adequate    SPECIAL CARE FACTORS FREQUENCY  PT (By licensed PT), OT (By licensed  OT)     PT Frequency: 5x week OT Frequency: 5x week            Contractures Contractures Info: Not present    Additional Factors Info  Code Status, Allergies Code Status Info:  DNR Allergies Info: Ace Inhibitors, Lipitor Atorvastatin Calcium, Metformin And Related, Atorvastatin Calcium, Morphine And Related, Robaxin           Current Medications (09/16/2019):  This is the current hospital active medication list Current Facility-Administered Medications  Medication Dose Route Frequency Provider Last Rate Last Admin  . apixaban (ELIQUIS) tablet 2.5 mg  2.5 mg Oral BID Domenic Polite, MD   2.5 mg at 09/16/19 1047  . arformoterol (BROVANA) nebulizer solution 15 mcg  15 mcg Nebulization BID Tyna Jaksch, MD   15 mcg at 09/16/19 4081  . budesonide (PULMICORT) nebulizer solution 0.25 mg  0.25 mg Nebulization BID Tyna Jaksch, MD   0.25 mg at 09/16/19 4481  . Chlorhexidine Gluconate Cloth 2 % PADS 6 each  6 each Topical Daily Dahal, Marlowe Aschoff, MD   6 each at 09/16/19 1051  . docusate sodium (COLACE) capsule 100 mg  100 mg Oral BID Terrilee Croak, MD   100 mg at 09/16/19 1046  . escitalopram (LEXAPRO) tablet 20 mg  20 mg Oral Daily Dahal, Binaya, MD   20 mg at 09/16/19 1047  . feeding supplement (PRO-STAT SUGAR FREE 64) liquid 30 mL  30 mL Oral TID BM Domenic Polite, MD   30 mL at 09/16/19 1046  . fentaNYL (SUBLIMAZE) injection 25 mcg  25 mcg Intravenous Q2H PRN Micheline Rough, MD   25 mcg at 09/16/19 1059  . gabapentin (NEURONTIN) capsule 300 mg  300 mg Oral QHS Domenic Polite, MD   300 mg at 09/15/19 2135  . Gerhardt's butt cream   Topical BID Minda Ditto, [facility]   Given at 09/16/19 1048  . guaiFENesin (MUCINEX) 12 hr tablet 600 mg  600 mg Oral BID PRN Terrilee Croak, MD      . hydrocerin (EUCERIN) cream   Topical BID Antonieta Pert, MD   Given at 09/16/19 1051  . ipratropium-albuterol (DUONEB) 0.5-2.5 (3) MG/3ML nebulizer solution 3 mL  3 mL Nebulization Q6H PRN Dahal, Binaya, MD      . levothyroxine (SYNTHROID) tablet 25 mcg  25 mcg Oral QAC breakfast Terrilee Croak, MD   25 mcg at 09/16/19 8563  . lidocaine (XYLOCAINE) 2 % viscous mouth solution 15 mL  15 mL  Mouth/Throat Q6H PRN Loistine Chance, MD      . magnesium oxide (MAG-OX) tablet 400 mg  400 mg Oral Daily Dahal, Binaya, MD   400 mg at 09/16/19 1047  . mupirocin ointment (BACTROBAN) 2 %   Topical Daily Caren Griffins, MD   Given at 09/16/19 1051  . nitroGLYCERIN (NITROSTAT) SL tablet 0.4 mg  0.4 mg Sublingual Q5 min PRN Dahal, Marlowe Aschoff, MD   0.4 mg at 09/15/19 2059  . nutrition supplement (JUVEN) (JUVEN) powder packet 1 packet  1 packet Oral BID BM Domenic Polite, MD   1 packet at 09/16/19 1046  . ondansetron (ZOFRAN) injection 4 mg  4 mg Intravenous Q6H PRN Hosie Poisson, MD   4 mg at 09/14/19 1616  . oxyCODONE (Oxy IR/ROXICODONE) immediate release tablet 5 mg  5 mg Oral Q8H PRN Dahal, Marlowe Aschoff, MD   5 mg at 09/16/19 1058  . pantoprazole (PROTONIX) EC tablet 40 mg  40 mg Oral Daily Dahal, Marlowe Aschoff, MD  40 mg at 09/16/19 1046  . predniSONE (DELTASONE) tablet 10 mg  10 mg Oral Q breakfast Caren Griffins, MD   10 mg at 09/16/19 1047  . rOPINIRole (REQUIP) tablet 0.5 mg  0.5 mg Oral QHS Dahal, Marlowe Aschoff, MD   0.5 mg at 09/15/19 2134     Discharge Medications: Please see discharge summary for a list of discharge medications.  Relevant Imaging Results:  Relevant Lab Results:   Additional Information ss#[id number]  Jacquline Terrill, Juliann Pulse, RN

## 2019-09-17 LAB — BASIC METABOLIC PANEL
Anion gap: 16 — ABNORMAL HIGH (ref 5–15)
BUN: 108 mg/dL — ABNORMAL HIGH (ref 8–23)
CO2: 18 mmol/L — ABNORMAL LOW (ref 22–32)
Calcium: 8.3 mg/dL — ABNORMAL LOW (ref 8.9–10.3)
Chloride: 86 mmol/L — ABNORMAL LOW (ref 98–111)
Creatinine, Ser: 3.8 mg/dL — ABNORMAL HIGH (ref 0.61–1.24)
GFR calc Af Amer: 17 mL/min — ABNORMAL LOW (ref >60)
GFR calc non Af Amer: 15 mL/min — ABNORMAL LOW (ref >60)
Glucose, Bld: 184 mg/dL — ABNORMAL HIGH (ref 70–99)
Potassium: 7 mmol/L (ref 3.5–5.1)
Sodium: 120 mmol/L — ABNORMAL LOW (ref 135–145)

## 2019-09-17 LAB — GLUCOSE, CAPILLARY
Glucose-Capillary: 185 mg/dL — ABNORMAL HIGH (ref 70–99)
Glucose-Capillary: 164 mg/dL — ABNORMAL HIGH (ref 70–99)

## 2019-09-17 MED ORDER — FENTANYL CITRATE (PF) 100 MCG/2ML IJ SOLN: 25.0000 ug | INTRAMUSCULAR | 0 refills | Status: AC | PRN

## 2019-09-17 MED ORDER — SODIUM CHLORIDE (PF) 0.9 % IJ SOLN: 5.0000 [IU] | Freq: Once | INTRAMUSCULAR | Status: DC

## 2019-09-17 MED ORDER — DEXTROSE 50 % IV SOLN: 1.0000 | Freq: Once | INTRAVENOUS | Status: DC

## 2019-09-17 MED ORDER — CALCIUM GLUCONATE 10 % IV SOLN
1.0000 g | Freq: Once | INTRAVENOUS | Status: DC
  Filled 2019-09-17 (×2): qty 10

## 2019-09-17 MED ORDER — SODIUM BICARBONATE 8.4 % IV SOLN
50.0000 meq | Freq: Once | INTRAVENOUS | Status: AC
  Administered 2019-09-17: 08:00:00 50 meq via INTRAVENOUS
  Filled 2019-09-17 (×2): qty 50

## 2019-09-17 MED ORDER — LORAZEPAM 2 MG/ML IJ SOLN: 1.0000 mg | Freq: Four times a day (QID) | INTRAMUSCULAR | 0 refills | Status: AC | PRN

## 2019-09-17 MED ORDER — CALCIUM GLUCONATE 10 % IV SOLN: 1.0000 g | Freq: Once | INTRAVENOUS | Status: DC

## 2019-09-17 MED ORDER — SODIUM ZIRCONIUM CYCLOSILICATE 10 G PO PACK
10.0000 g | PACK | Freq: Every day | ORAL | Status: DC
  Filled 2019-09-17: qty 1

## 2019-09-17 MED ORDER — SODIUM BICARBONATE 4.2 % IV SOLN: 50.0000 meq | Freq: Once | INTRAVENOUS | Status: DC

## 2019-09-17 MED ORDER — SODIUM BICARBONATE 8.4 % IV SOLN
50.0000 meq | Freq: Once | INTRAVENOUS | Status: DC
  Filled 2019-09-17: qty 50

## 2019-09-17 MED ORDER — INSULIN ASPART 100 UNIT/ML IV SOLN
5.0000 [IU] | Freq: Once | INTRAVENOUS | Status: AC
  Administered 2019-09-17: 5 [IU] via INTRAVENOUS

## 2019-09-17 NOTE — Progress Notes (Signed)
Pt discharged today per [place] MD. Pt's IV site D/C'd and site WDL. PT VSS. PITAR sent with all belongings. Verbalizes understanding. Pt left floor via stretcher in stable condition accompained by [place].

## 2019-09-17 NOTE — TOC Progression Note (Signed)
Transition of Care [facility]) - Progression Note    Patient Details  Name: [name] MRN: [id number] Date of Birth: 28-Nov-1945  Transition of Care [facility]) CM/SW Contact  Ross Ludwig, [place] Phone Number: 09/17/2019, 10:25 AM  Clinical Narrative:     [name] was informed that patient will need residential hospice.  [name] spoke to patient's brother and provided choice of hospice facilities.  He did not have a preference, [name] called [employer] rep Harmon Pier to have her review patient and let this [name] know if bed is available.  [name] awaiting for call back from Harmon Pier, [place] to continue to follow patient's progress throughout discharge planning.   Expected Discharge Plan: [place]    Expected Discharge Plan and Services Expected Discharge Plan: [place]                                               Social Determinants of Health (SDOH) Interventions    Readmission Risk Interventions No flowsheet data found.

## 2019-09-17 NOTE — Progress Notes (Signed)
Nutrition Follow-up  DOCUMENTATION CODES:   Morbid obesity  INTERVENTION:  - continue 30 ml prostat TID and juven BID.   NUTRITION DIAGNOSIS:   Increased nutrient needs related to acute illness, wound healing as evidenced by estimated needs. 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 He presented to the ED from [facility] on 12/26 due to hypoxia requiring 8L O2 in the ED.CT showed bilateral pleural effusions (L>R), pulmonary edema, and cardiomegaly with pericardial effusion.  Patient is a/o to self and place. Review of orders show he has been accepting prostat ~50% of the time and juven ~25% of the time. He has been eating fairly well over the past 1 week with recent intakes documented:  12/31- 100% of breakfast and 10% of lunch 1/2- 100% of all meals 1/3- 50% of breakfast 1/6- 30% of breakfast and 0% of lunch  Current weight is consistent with admission weight, but weight has trended significantly up in the interim.   Palliative is following and last spoke with family earlier this AM. Plan at this time is for transfer to residential hospice. No official orders or official orders for change to comfort care placed at this time. Palliative Care note states prognosis of days to 2 weeks.    Labs reviewed; CBG: 185 mg/dl, Na: 120 mmol/l, K: 7 mmol/l, Cl: 86 mmol/l, BUN: 108 mg/dl, creatinine: 3.8 mg/dl, Ca: 8.3 mg/dl, GFR: 15 ml/min. Medications reviewed; 100 mg colace BID, 5 units novolog x1 dose 1/7, 25 mcg oral synthroid/day, 400 mg mag-ox/day, 10 mg deltasone/day, 50 mEq  sodium bicarb x1 dose 1/7, 10 g lokelma/day.      NUTRITION - FOCUSED PHYSICAL EXAM:  completed; no muscle or fat wasting, severe/deep pitting edema to all extremities.   Diet Order:   Diet Order            Diet Heart Room service appropriate? Yes; Fluid consistency: Thin; Fluid restriction: 1500 mL Fluid  Diet effective now              EDUCATION NEEDS:   Not appropriate for education at this time  Skin:  Skin Assessment: Skin Integrity Issues: Skin Integrity Issues:: Stage II, Unstageable, DTI DTI: sacrum; R thigh Stage II: toe Unstageable: L toe  Last BM:  1/5  Height:   Ht Readings from Last 1 Encounters:  09/05/19 6' 2"  (1.88 m)    Weight:   Wt Readings from Last 1 Encounters:  09/14/19 (!) 156 kg    Ideal Body Weight:  86.4 kg  BMI:  Body mass index is 44.16 kg/m.  Estimated Nutritional Needs:   Kcal:  2100-2300 kcal  Protein:  105-115 grams  Fluid:  >/= 2.2 L/day     Jarome Matin, MS, RD, LDN, [facility] Inpatient Clinical Dietitian Pager # [phone] After hours/weekend pager # [phone]

## 2019-09-17 NOTE — Progress Notes (Signed)
Critical potassium 7.0. Paged Tylene Fantasia

## 2019-09-17 NOTE — Progress Notes (Signed)
Daily Progress Note   Patient Name: [name]       Date: 09/17/2019 DOB: [birth year]  Age: 74 y.o. MRN#: [id number] Attending Physician: [place] Eric J, DO Primary Care Physician: Patient, No Pcp Per Admit Date: 09/05/2019  Reason for Consultation/Follow-up: Establishing goals of care and Non pain symptom management  Subjective:  [name] continues to appear tired, fatigued.  Resting in bed.  Generalized edema.  He opens his eyes briefly, attempts to engage.  Overall, I think he is confused.  He only responds "Aah" to all of the discussions that I am trying to have with him. Call placed and discussed in detail with brother [name] Bon, see below Length of Stay: 12  Current Medications: Scheduled Meds:  . apixaban  2.5 mg Oral BID  . arformoterol  15 mcg Nebulization BID  . budesonide (PULMICORT) nebulizer solution  0.25 mg Nebulization BID  . calcium gluconate  1 g Intravenous Once  . Chlorhexidine Gluconate Cloth  6 each Topical Daily  . dextrose  1 ampule Intravenous Once  . docusate sodium  100 mg Oral BID  . escitalopram  20 mg Oral Daily  . feeding supplement (PRO-STAT SUGAR FREE 64)  30 mL Oral TID BM  . gabapentin  300 mg Oral QHS  . Gerhardt's butt cream   Topical BID  . hydrocerin   Topical BID  . levothyroxine  25 mcg Oral QAC breakfast  . magnesium oxide  400 mg Oral Daily  . mupirocin ointment   Topical Daily  . nutrition supplement (JUVEN)  1 packet Oral BID BM  . pantoprazole  40 mg Oral Daily  . predniSONE  10 mg Oral Q breakfast  . rOPINIRole  0.5 mg Oral QHS  . sodium bicarbonate  50 mEq Intravenous Once  . sodium zirconium cyclosilicate  10 g Oral Daily    Continuous Infusions:   PRN Meds: fentaNYL (SUBLIMAZE) injection, guaiFENesin,  ipratropium-albuterol, lidocaine, nitroGLYCERIN, ondansetron (ZOFRAN) IV, oxyCODONE  Physical Exam         74 year old gentleman with obesity, on supplemental oxygen. Appears tired. Doesn't interact much Regular pattern of respirations. Abdomen with generalized distention. Has lower extremity edema. Chart review notes that he has stage II sacral ulcer.  Vital Signs: BP 91/63 (BP Location: Right Arm)   Pulse 67   Temp  97.8 F (36.6 C) (Oral)   Resp 16   Ht 6\' 2"  (1.88 m)   Wt (!) 156 kg   SpO2 99%   BMI 44.16 kg/m  SpO2: SpO2: 99 % O2 Device: O2 Device: Nasal Cannula O2 Flow Rate: O2 Flow Rate (L/min): 3 L/min  Intake/output summary:   Intake/Output Summary (Last 24 hours) at 09/17/2019 9326 Last data filed at 09/17/2019 0600 Gross per 24 hour  Intake 600 ml  Output 250 ml  Net 350 ml   LBM: Last BM Date: 09/15/19 Baseline Weight: Weight: (!) 157 kg Most recent weight: Weight: (!) 156 kg       Palliative Assessment/Data:    Flowsheet Rows     Most Recent Value  Intake Tab  Referral Department  Hospitalist  Unit at Time of Referral  ER  Palliative Care Primary Diagnosis  Pulmonary  Date Notified  09/06/19  Palliative Care Type  New Palliative care  Reason for referral  Clarify Goals of Care  Date of Admission  09/05/19  Date first seen by Palliative Care  09/07/19  # of days Palliative referral response time  1 Day(s)  # of days IP prior to Palliative referral  1  Clinical Assessment  Psychosocial & Spiritual Assessment  Palliative Care Outcomes      Patient Active Problem List   Diagnosis Date Noted  . Palliative care by specialist   . Goals of care, counseling/discussion   . Generalized weakness   . Acute on chronic respiratory failure with hypoxia ([place]) 09/05/2019  . Pleural effusion 09/05/2019  . Acute respiratory failure with hypoxia ([place]) 09/05/2019  . Hypercapnic respiratory failure ([place]) 09/05/2019  . Medically noncompliant 06/08/2018  .  Chronic pain syndrome 06/08/2018  . Intractable nausea and vomiting 05/29/2018  . Sacral decubitus ulcer 05/29/2018  . History of pulmonary embolism 05/29/2018  . History of ESBL E. coli infection 05/29/2018  . History of MRSA infection 05/29/2018  . Acute adrenal insufficiency ([place]) 05/29/2018  . Cellulitis of buttock 05/29/2018  . Boil 09/08/2017  . Trigger finger 09/08/2017  . Acute pulmonary embolism ([place]) 09/07/2017  . Pressure injury of skin 09/06/2017  . Chest pain 09/03/2017  . Hyperkalemia 09/03/2017  . Allergic rhinitis 08/19/2017  . Anemia 06/25/2017  . Cellulitis of left hip 05/27/2017  . Acute cystitis without hematuria   . Dyslipidemia associated with type 2 diabetes mellitus ([place]) 01/17/2017  . 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 Hypertensive heart disease with CHF (congestive heart failure) ([place]) 04/15/2016  . H/O: CVA (cerebrovascular accident) 01/22/2016  . Sepsis ([place]) 01/11/2016  . Depression   . Syncope 11/01/2015  . GERD (gastroesophageal reflux disease) 10/22/2015  . Restless legs 10/22/2015  . CAD in native artery 09/15/2015  . Morbid obesity ([place]) 07/03/2015  . Uncontrolled type II diabetes with peripheral autonomic neuropathy ([place]) 05/09/2015  . Hyperlipidemia LDL goal <70 05/02/2015  . Neurogenic bladder 04/26/2015  . Catheter-associated urinary tract infection ([place]) 04/14/2015  . Abdominal pain 06/25/2014  . Chronic diastolic CHF (congestive heart failure) ([place]) 04/01/2014  . Acute on chronic diastolic CHF (congestive heart failure) ([place]) 03/21/2014  . Chronic indwelling Foley catheter 12/23/2013  . Hypokalemia 03/11/2012  . Diabetic neuropathy ([place]) 10/10/2011  . OSA (obstructive sleep apnea) 10/10/2011    Palliative Care Assessment & Plan   Patient Profile: 74 year old gentleman with a history of obesity, bullous pemphigoid on chronic steroids and methotrexate, chronic diastolic heart failure, chronic respiratory failure on 2 L oxygen at home,  PE, hypertension, diabetes,  chronic indwelling Foley and neurogenic bladder.  Assessment: Patient has been admitted to hospital medicine service with acute on chronic respiratory failure secondary to bilateral effusions, moderate pericardial effusion.  Acute on chronic systolic and diastolic heart failure. Hospital course also complicated by acute kidney injury, acute metabolic encephalopathy, possible hypercapnia as well as hypoxia.  Ongoing periods of alertness and confusion, more sleepy since the past few days or so.  Palliative medicine team consulted and following for symptom management and goals of care discussions.  Patient with elevated serum potassium levels and elevated serum creatinine levels.  Basic metabolic profile from today noted.  Medications noted.  Patient with ongoing fatigue, lethargy, declining mental status, minimal to nil oral intake.  Recommendations/Plan: Call placed and discussed with the patient's brother Linna Hoff over the phone.  We continued our conversations from yesterday regarding broad overall goals of care as well as figuring out appropriate disposition options.  We discussed about the patient's ongoing decline trajectory which continues, we discussed about the patient's worsening renal function.  Concerns of cardiorenal syndrome, end-stage heart failure, end-stage kidney disease poor circulation, no interest in food or drink all pointing towards end-of-life signs and symptoms discussed frankly but compassionately.  Discussed that patient's prognosis could be as short as few days to as long as up to 2 weeks at this point in my opinion.  Hence, in my opinion, he would benefit most from establishment of comfort measures as well as transfer to residential hospice with primary focus being on avoiding suffering an enhancing dignity at end-of-life.  Patient's brother Linna Hoff is in full agreement.  Will request care management assistance in facilitating this.  Continue fentanyl IV as  needed.  Code Status:    Code Status Orders  (From admission, onward)         Start     Ordered   09/05/19 2028  Do not attempt resuscitation (DNR)  Continuous    Question Answer Comment  In the event of cardiac or respiratory ARREST Do not call a "code blue"   In the event of cardiac or respiratory ARREST Do not perform Intubation, CPR, defibrillation or ACLS   In the event of cardiac or respiratory ARREST Use medication by any route, position, wound care, and other measures to relive pain and suffering. May use oxygen, suction and manual treatment of airway obstruction as needed for comfort.      09/05/19 2028        Code Status History    Date Active Date Inactive Code Status Order ID Comments User Context   09/05/2019 1904 09/05/2019 2028 DNR [id number]  Antonieta Pert, MD ED   05/29/2018 2107 05/31/2018 1612 DNR [id number]  Etta Quill, DO ED   09/06/2017 0911 09/08/2017 2135 DNR [id number]  Jani Gravel, MD Inpatient   09/03/2017 0742 09/06/2017 0911 Full Code [id number]  Rondel Jumbo, PA-C ED   05/27/2017 2004 06/05/2017 1807 Full Code [id number]  Ivor Costa, MD ED   05/27/2016 1247 06/02/2016 1725 Full Code [id number]  Samella Parr, NP Inpatient   01/11/2016 1934 01/16/2016 1736 DNR [id number]  Ivor Costa, MD ED   01/11/2016 1631 01/11/2016 1934 DNR [id number]  Carmin Muskrat, MD ED   10/08/2015 0856 10/14/2015 1724 Full Code [id number]  Rama, Venetia Maxon, MD ED   09/23/2015 0207 09/25/2015 2142 Full Code [id number]  Ivor Costa, MD ED   09/07/2015 1559 09/15/2015 2205 Full Code [id number]  Theodis Blaze, MD ED   07/01/2015 1838 07/06/2015 1959 Full Code  [id number]  Elmarie Shiley, MD Inpatient   05/04/2015 1703 05/06/2015 2048 Full Code [id number]  Wellington Hampshire, MD Inpatient   05/02/2015 1523 05/04/2015 1703 Full Code [id number]  Lorretta Harp, MD Inpatient   04/30/2015 0209 05/02/2015 1523 Full Code [id number]  Sid Falcon, MD Inpatient   04/14/2015 1605 04/18/2015 1743 Full Code [id number]   Albertine Patricia, MD Inpatient   12/07/2014 2155 12/09/2014 1720 Full Code [id number]  Reola Mosher Inpatient   11/15/2014 0156 11/19/2014 1617 Full Code [id number]  Allie Bossier, MD ED   11/10/2014 1709 11/11/2014 1738 Full Code [id number]  Samella Parr, NP Inpatient   08/27/2014 2116 08/31/2014 2049 Full Code [id number]  Elmarie Shiley, MD Inpatient   06/25/2014 0111 06/29/2014 1641 Full Code [id number]  Shanda Howells, MD ED   04/01/2014 1843 04/03/2014 1802 Full Code [id number]  Orson Eva, MD Inpatient   03/21/2014 1907 03/24/2014 1341 Full Code [id number]  Verlee Monte, MD Inpatient   12/23/2013 1356 12/26/2013 1922 DNR [id number]  Melton Alar, PA-C Inpatient   03/28/2012 0121 03/30/2012 1513 Full Code [id number]  Arlyss Repress., MD ED   03/28/2012 0116 03/28/2012 0121 Full Code [id number]  Arlyss Repress., MD ED   11/16/2011 0000 11/20/2011 1226 Full Code [id number]  Georgeanna Harrison, RN Inpatient   11/12/2011 1632 11/14/2011 1939 Full Code [id number]  Curtis Sites, RN Inpatient   Advance Care Planning Activity       Prognosis: Few days to possibly less than 2 weeks at this point  Discharge Planning:  Residential hospice  Care plan was discussed with  Brother on the phone.   Thank you for allowing the Palliative Medicine Team to assist in the care of this patient.   Time In: 8 Time Out: 8.35 Total Time 35 Prolonged Time Billed  no       Greater than 50%  of this time was spent counseling and coordinating care related to the above assessment and plan.  Loistine Chance, MD  Please contact Palliative Medicine Team phone at [phone] for questions and concerns.

## 2019-09-17 NOTE — Progress Notes (Signed)
[facility] paperwork complete.   Please send discharge summary to [phone].  RN please call report to [phone].  Thank you,  Erling Conte, LCSW [phone]

## 2019-09-17 NOTE — TOC Transition Note (Signed)
Transition of Care [facility]) - CM/SW Discharge Note   Patient Details  Name: [name] MRN: [id number] Date of Birth: 10-03-1945  [facility]) CM/SW Contact:  Ross Ludwig, LCSW Phone Number: 09/17/2019, 4:56 PM   Clinical Narrative:     Patient to be d/c'ed today to [facility] for residential hospice.  Patient and family agreeable to plans will transport via ems RN to call report to [phone].  [name] updated patient's brother that he is leaving today.   Final next level of care: [place] Barriers to Discharge: Barriers Resolved   Patient Goals and CMS Choice Patient states their goals for this hospitalization and ongoing recovery are:: To go to hospice facility for end of life care. CMS Medicare.gov Compare Post Acute Care list provided to:: Patient Represenative (must comment) Choice offered to / list presented to : Sibling  Discharge Placement              Patient chooses bed at: Other - please specify in the comment section below:([facility] residential hospice) Patient to be transferred to facility by: [place] Name of family member notified: [name] [phone] Patient and family notified of of transfer: 09/17/19  Discharge Plan and Services                DME Arranged: N/A                    Social Determinants of Health (SDOH) Interventions     Readmission Risk Interventions No flowsheet data found.

## 2019-09-17 NOTE — Discharge Summary (Signed)
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 CT of the chest showed bilateral pleural effusion concerning for pulmonary edema with pericardial effusion. He was diuresed appropriately also underwent thoracentesis with minimal improvement. 2D echocardiogram showed new very depressed left ventricular ejection fraction of about 40% with apical wall motion abnormality. Cardiology consulted and he was diuresed appropriately however his creatinine continued to get worse and Lasix was discontinued. Nephrology consulted for AKI from diuresis suggested patient is not a hemodialysis candidate recommended comfort care.  Palliative care consulted and on further discussions with the family transitioned the patient to supportive care and plan to discharge to residential hospice in 1 to 2 days, depending on his progress. Patient has been refusing meds for several days per RN; hence we discontinued telemetry and cholesterol medication, multivitamins and iron tablets,nonessential medications after discussing with his brother. Patient did not want any lab draws or insulin injections at this time. Discussed with the patient's brother and discontinued lab draws and sliding scale insulin and  Lantus.   Hospital course:  Acute on combined chronic systolic/diastolic congestive heart failure moderate pericardial effusion  TTE with EF 40-45% with left apical hypokinesis, cardiology followed during the initial hospital course.  Patient was aggressively diuresed earlier in the hospitalization with optimal improvement of his underlying respiratory distress; unfortunately leading to cardiorenal syndrome with acute renal failure.  Nephrology was consulted for the acute renal failure stemming from the aggressive diuresis and recommended patient not a candidate for hemodialysis and suggested comfort measures.  Palliative care was consulted and family have decided to transition his care to comfort measures.  Patient be discharging to residential hospice.  Acute metabolic encephalopathy probably secondary to hypercapnia and acute respiratory failure with hypoxia. Patient is alert has memory deficits and is confused.  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 No symptoms of dysuria at this time.  History of pulmonary embolism Discontinue Eliquis  History of chronic ambulatory dysfunction with chronic pain syndrome along with the restless leg syndrome Discontinue Requip, continue fentanyl IM as needed for pain control  History of anxiety and depression Discontinue Lexapro  Hypothyroidism:  Discontinue Synthroid  Anemia of chronic disease Hemoglobin stable around 9.No new blood draws at this time.  Stage II sacral pressure ulcer, present on admission --Continue local wound care, frequent  offloading  Discharge Diagnoses:  Principal Problem:   Acute on chronic respiratory failure with hypoxia (HCC) Active Problems:   Chronic indwelling Foley catheter   Chronic diastolic CHF (congestive heart failure) (HCC)   Neurogenic bladder   Hyperlipidemia  LDL goal <70   Uncontrolled type II diabetes with peripheral autonomic neuropathy (HCC)   Morbid obesity (HCC)   GERD (gastroesophageal reflux disease)   H/O: CVA (cerebrovascular accident)   Pleural effusion   Acute respiratory failure with hypoxia (HCC)   Hypercapnic respiratory failure (HCC)   Palliative care by specialist   Goals of care, counseling/discussion   Generalized weakness    Discharge Instructions  Discharge Instructions    Diet - low sodium heart healthy   Complete by: As directed    Increase activity slowly   Complete by: As directed      Allergies as of 09/17/2019      Reactions   Ace Inhibitors Swelling   Pt tolerates lisinopril   Lipitor [atorvastatin Calcium] Swelling   Metformin And Related Swelling   Atorvastatin Calcium    Morphine And Related Other (See Comments)   Sweating, feels like is "in rocky boat."   Robaxin [methocarbamol] Other (See Comments)   Feels like he is shaky      Medication List    STOP taking these medications   acetaminophen 500 MG tablet Commonly known as: TYLENOL   aluminum-magnesium hydroxide-simethicone 683-419-62 MG/5ML Susp Commonly known as: MAALOX   aspirin EC 81 MG tablet   b complex vitamins tablet   bisacodyl 10 MG suppository Commonly known as: DULCOLAX   cholecalciferol 25 MCG (1000 UT) tablet Commonly known as: VITAMIN D   diclofenac sodium 1 % Gel Commonly known as: VOLTAREN   docusate sodium 100 MG capsule Commonly known as: COLACE   escitalopram 20 MG tablet Commonly known as: LEXAPRO   ezetimibe 10 MG tablet Commonly known as: ZETIA   ferrous sulfate 325 (65 FE) MG tablet   folic acid 1 MG tablet Commonly known as: FOLVITE    gabapentin 300 MG capsule Commonly known as: NEURONTIN   guaiFENesin 600 MG 12 hr tablet Commonly known as: MUCINEX   HumaLOG 100 UNIT/ML injection Generic drug: insulin lispro   ipratropium-albuterol 0.5-2.5 (3) MG/3ML Soln Commonly known as: DUONEB   levothyroxine 25 MCG tablet Commonly known as: SYNTHROID   magnesium oxide 400 MG tablet Commonly known as: MAG-OX   metoprolol succinate 25 MG 24 hr tablet Commonly known as: TOPROL-XL   multivitamin tablet   nitroGLYCERIN 0.4 MG SL tablet Commonly known as: NITROSTAT   NON FORMULARY   Norvasc 5 MG tablet Generic drug: amLODipine   oxyCODONE 5 MG immediate release tablet Commonly known as: Oxy IR/ROXICODONE   OXYGEN   pantoprazole 40 MG tablet Commonly known as: PROTONIX   polyethylene glycol 17 g packet Commonly known as: MIRALAX / GLYCOLAX   predniSONE 10 MG tablet Commonly known as: DELTASONE   rivaroxaban 10 MG Tabs tablet Commonly known as: XARELTO   rOPINIRole 0.5 MG tablet Commonly known as: REQUIP   rosuvastatin 5 MG tablet Commonly known as: CRESTOR   simethicone 125 MG chewable tablet Commonly known as: MYLICON   traZODone 229 MG tablet Commonly known as: DESYREL   vitamin C 500 MG tablet Commonly known as: ASCORBIC ACID   zinc gluconate 50 MG tablet     TAKE these medications   fentaNYL 100 MCG/2ML injection Commonly known as: SUBLIMAZE Inject 0.5 mLs (25 mcg total) into the muscle every 2 (two) hours as needed for severe pain.   LORazepam 2 MG/ML injection Commonly known as: ATIVAN Inject 0.5 mLs (1 mg total) into the muscle every 6 (six) hours as needed for anxiety.  Allergies  Allergen Reactions  . Ace Inhibitors Swelling    Pt tolerates lisinopril  . Lipitor [Atorvastatin Calcium] Swelling  . Metformin And Related Swelling  . Atorvastatin Calcium   . Morphine And Related Other (See Comments)    Sweating, feels like is "in rocky boat."  . Robaxin  [Methocarbamol] Other (See Comments)    Feels like he is shaky    Consultations:  Nephrology  Cardiology  Palliative care   Procedures/Studies: DG Chest 1 View  Result Date: 09/07/2019 CLINICAL DATA:  Status post left thoracentesis EXAM: CHEST  1 VIEW COMPARISON:  09/05/2019 FINDINGS: Cardiac shadow remains enlarged. Recent left thoracentesis has been performed with significant reduction in left pleural effusion. No pneumothorax is noted. The overall inspiratory effort is poor. Mild basilar atelectasis on the left is noted. Postsurgical changes near the gastroesophageal junction are seen. IMPRESSION: No pneumothorax following left thoracentesis. Electronically Signed   By: Inez Catalina M.D.   On: 09/07/2019 12:32   CT CHEST WO CONTRAST  Result Date: 09/06/2019 CLINICAL DATA:  Abnormal chest radiograph, pleural effusion history of bullous pemphigoid EXAM: CT CHEST WITHOUT CONTRAST TECHNIQUE: Multidetector CT imaging of the chest was performed following the standard protocol without IV contrast. COMPARISON:  Same-day radiograph, CT 05/28/2016 FINDINGS: Cardiovascular: There is cardiomegaly with four-chamber enlargement of the heart. Dense mitral annular calcifications are noted. Extensive calcification of the coronary arteries is seen. Calcifications are present on the aortic valve leaflets. Atherosclerotic plaque within the normal caliber aorta. Normal 3 vessel branching of the aortic arch. Proximal great vessels are heavily calcified. Central pulmonary arteries are normal caliber. Luminal evaluation of the vasculature precluded in the absence of contrast Mediastinum/Nodes: Thyroid gland and thoracic inlet are unremarkable. No acute abnormality of the trachea or esophagus. Scattered low-attenuation mediastinal nodes, likely reactive edematous. No pathologically enlarged mediastinal or axillary adenopathy. Hilar adenopathy difficult to assess in the absence of contrast media. Lungs/Pleura:  Moderate to large left pleural effusion with associated passive atelectasis with essentially complete collapse of the left lower lobe and segmental collapse of the lingula and posterior segment left upper lobe. Shall trace right pleural effusion is present with more bandlike areas of subsegmental atelectatic change. There are interspersed areas of ground-glass opacity with septal and fissural thickening in the aerated portions of the lungs compatible with pulmonary edema. Peribronchovascular cuffing is noted. Upper Abdomen: Numerous surgical clips throughout the upper abdomen results in extensive streak artifact which limits evaluation of the adjacent soft tissues. Vascular calcium noted throughout the upper abdomen. Portion of the hepatic flexure is interposed anterior to the left lobe liver. No acute abnormalities present in the visualized portions of the upper abdomen. Musculoskeletal: Anterior wedging at T11 is increased from comparison study 05/28/2016 but has an overall subacute to chronic appearance. Correlate for point tenderness. Multilevel degenerative changes are present in the imaged portions of the spine. Moderate bilateral gynecomastia. IMPRESSION: 1. Moderate to large left pleural effusion with associated passive atelectasis with essentially complete collapse of the left lower lobe and segmental collapse of the lingula and posterior segment left upper lobe. 2. Small right pleural effusion with more bandlike areas of adjacent subsegmental atelectatic change. 3. Ground-glass and septal thickening with vascular cuffing in the lungs compatible with pulmonary edema. 4. Moderate pericardial effusion. Cardiomegaly with four-chamber enlargement of the heart. Dense mitral annular calcifications are noted. 5. Constellation of features are most suggestive of CHF/volume overload. 6. Anterior wedging at T11 is increased from comparison study 05/28/2016 but has an overall subacute  to chronic appearance. Correlate  for point tenderness. 7. Portion of the hepatic flexure is interposed anterior to the left lobe liver. 8.  Aortic Atherosclerosis (ICD10-I70.0). Electronically Signed   By: Lovena Le M.D.   On: 09/06/2019 00:06   DG CHEST PORT 1 VIEW  Result Date: 09/09/2019 CLINICAL DATA:  Respiratory failure. EXAM: PORTABLE CHEST 1 VIEW COMPARISON:  September 07, 2019. FINDINGS: Stable cardiomegaly. No pneumothorax is noted. Right lung is clear. Increased left perihilar and basilar opacity is noted concerning for worsening pneumonia or atelectasis. Probable small left pleural effusion is noted. Bony thorax is unremarkable. IMPRESSION: Increased left perihilar and basilar opacity is noted concerning for worsening pneumonia or atelectasis. Probable small left pleural effusion is noted as well. Stable mild cardiomegaly is noted. No pneumothorax is noted. Electronically Signed   By: Marijo Conception M.D.   On: 09/09/2019 07:29   DG Chest Portable 1 View  Result Date: 09/05/2019 CLINICAL DATA:  Shortness of breath.  Hypoxia.  COVID-19. EXAM: PORTABLE CHEST 1 VIEW COMPARISON:  Chest x-ray dated 05/29/2018 FINDINGS: The patient has a new large left pleural effusion. Air bronchograms are visible at the left lung base. There is minimal aeration remaining in the left upper lobe. There is faint haziness in the right upper lobe which may represent an early infiltrate. Heart size and pulmonary vascularity are normal. No acute bone abnormality. IMPRESSION: New large left pleural effusion with atelectasis and consolidation in the left lung as described. Possible faint infiltrate in the right upper lobe. Electronically Signed   By: Lorriane Shire M.D.   On: 09/05/2019 15:25   DG Abd 2 Views  Result Date: 09/14/2019 CLINICAL DATA:  Nausea and vomiting EXAM: ABDOMEN - 2 VIEW COMPARISON:  None. FINDINGS: Scattered large and small bowel gas is noted. No abnormal mass or abnormal calcifications are seen. Mild retained fecal material is  noted within the right colon. No free air is seen. Postsurgical changes are noted stable from the previous exam. IMPRESSION: Mild retained fecal material within the right colon. No other focal abnormality is noted. Electronically Signed   By: Inez Catalina M.D.   On: 09/14/2019 19:14   ECHOCARDIOGRAM COMPLETE  Result Date: 09/06/2019   ECHOCARDIOGRAM REPORT   Patient Name:   [name] Date of Exam: 09/06/2019 Medical Rec #:  [id number]      Height:       74.0 in Accession #:    [id number]     Weight:       346.1 lb Date of Birth:  1946-04-03       BSA:          2.74 m Patient Age:    50 years       BP:           89/52 mmHg Patient Gender: M              HR:           59 bpm. Exam Location:  Inpatient Procedure: 2D Echo, Color Doppler, Cardiac Doppler and Intracardiac            Opacification Agent Indications:    CHF 428  History:        Patient has prior history of Echocardiogram examinations, most                 recent 09/09/2017. CHF; Aortic Valve Disease. Covid 19.  Sonographer:    Merrie Roof RDCS Referring Phys: [id number] [place] IMPRESSIONS  1.  Left ventricular ejection fraction, by visual estimation, is 40 to 45%. The left ventricle has mild to moderately decreased function. There is no left ventricular hypertrophy.  2. Moderate hypokinesis of the left ventricular, entire apical segment.  3. Left ventricular diastolic function could not be evaluated.  4. The left ventricle demonstrates regional wall motion abnormalities.  5. Global right ventricle has normal systolic function.The right ventricular size is normal. No increase in right ventricular wall thickness.  6. Left atrial size was moderately dilated.  7. Right atrial size was normal.  8. Moderate pericardial effusion.  9. The pericardial effusion is circumferential. 10. Moderate mitral annular calcification. 11. The mitral valve is normal in structure. Trivial mitral valve regurgitation. 12. The tricuspid valve is normal in structure. 13. The  aortic valve is tricuspid. Aortic valve regurgitation is trivial. 14. The pulmonic valve was grossly normal. Pulmonic valve regurgitation is not visualized. 15. TR signal is inadequate for assessing pulmonary artery systolic pressure. 16. The inferior vena cava is dilated in size with >50% respiratory variability, suggesting right atrial pressure of 8 mmHg. FINDINGS  Left Ventricle: Left ventricular ejection fraction, by visual estimation, is 40 to 45%. The left ventricle has mild to moderately decreased function. Moderate hypokinesis of the left ventricular, entire apical segment. The left ventricle demonstrates regional wall motion abnormalities. The left ventricular internal cavity size was the left ventricle is normal in size. There is no left ventricular hypertrophy. The left ventricular diastology could not be evaluated due to atrial fibrillation. Left ventricular diastolic function could not be evaluated. Right Ventricle: The right ventricular size is normal. No increase in right ventricular wall thickness. Global RV systolic function is has normal systolic function. Left Atrium: Left atrial size was moderately dilated. Right Atrium: Right atrial size was normal in size Pericardium: A moderately sized pericardial effusion is present. The pericardial effusion is circumferential. There is no evidence of cardiac tamponade. Mitral Valve: The mitral valve is normal in structure. Moderate mitral annular calcification. Trivial mitral valve regurgitation. Tricuspid Valve: The tricuspid valve is normal in structure. Tricuspid valve regurgitation is not demonstrated. Aortic Valve: The aortic valve is tricuspid. . There is moderate thickening and moderate calcification of the aortic valve. Aortic valve regurgitation is trivial. There is moderate thickening of the aortic valve. There is moderate calcification of the aortic valve. Pulmonic Valve: The pulmonic valve was grossly normal. Pulmonic valve regurgitation is not  visualized. Pulmonic regurgitation is not visualized. Aorta: The aortic root and ascending aorta are structurally normal, with no evidence of dilitation. Venous: IVC assessment for right atrial pressure unable to be performed due to mechanical ventilation. The inferior vena cava is dilated in size with greater than 50% respiratory variability, suggesting right atrial pressure of 8 mmHg. IAS/Shunts: No atrial level shunt detected by color flow Doppler.  Sanda Klein MD Electronically signed by Sanda Klein MD Signature Date/Time: 09/06/2019/2:35:11 PM    Final    VAS [place] UPPER EXTREMITY VENOUS DUPLEX  Result Date: 09/08/2019 UPPER VENOUS STUDY  Indications: Swelling Limitations: Body habitus and poor ultrasound/tissue interface. Comparison Study: No prior study Performing Technologist: Maudry Mayhew MHA, RDMS, RVT, RDCS  Examination Guidelines: A complete evaluation includes B-mode imaging, spectral Doppler, color Doppler, and power Doppler as needed of all accessible portions of each vessel. Bilateral testing is considered an integral part of a complete examination. Limited examinations for reoccurring indications may be performed as noted.  Right Findings: +----------+------------+---------+-----------+----------+--------------+ RIGHT     CompressiblePhasicitySpontaneousProperties   Summary     +----------+------------+---------+-----------+----------+--------------+  Subclavian                                          Not visualized +----------+------------+---------+-----------+----------+--------------+  Left Findings: +----------+------------+---------+-----------+----------+--------------+ LEFT      CompressiblePhasicitySpontaneousProperties   Summary     +----------+------------+---------+-----------+----------+--------------+ IJV           Full       Yes       Yes                             +----------+------------+---------+-----------+----------+--------------+  Subclavian    Full       Yes       Yes                             +----------+------------+---------+-----------+----------+--------------+ Axillary      Full       Yes       Yes                             +----------+------------+---------+-----------+----------+--------------+ Brachial      Full       Yes       Yes                             +----------+------------+---------+-----------+----------+--------------+ Radial        Full                                                 +----------+------------+---------+-----------+----------+--------------+ Ulnar                                               Not visualized +----------+------------+---------+-----------+----------+--------------+ Cephalic                                            Not visualized +----------+------------+---------+-----------+----------+--------------+  Summary:  Left: No evidence of deep vein thrombosis in the upper extremity. No evidence of superficial vein thrombosis in the upper extremity. This was a limited exam.  *See table(s) above for measurements and observations.  Diagnosing physician: Ruta Hinds MD Electronically signed by Ruta Hinds MD on 09/08/2019 at 1:46:56 PM.    Final      Subjective: Patient seen and examined bedside, resting comfortably.  Continues with confusion, very little interaction today.  No family members present at bedside.  Plan to discharge to residential hospice today.    Discharge Exam: Vitals:   09/17/19 0642 09/17/19 0813  BP: 91/63   Pulse: 67   Resp: 16   Temp: 97.8 F (36.6 C)   SpO2: 95% 99%   Vitals:   09/16/19 2052 09/16/19 2151 09/17/19 0642 09/17/19 0813  BP:  (!) 109/50 91/63   Pulse:  60 67   Resp:  20 16   Temp:  98.2 F (36.8 C) 97.8 F (36.6 C)   TempSrc:  Oral  Oral   SpO2: 97% 100% 95% 99%  Weight:      Height:        General: Pt is alert, awake, not in acute distress, confused, chronically ill in  appearance, obese Cardiovascular: RRR, S1/S2 +, no rubs, no gallops Respiratory: Decreased breath sounds bilateral bases with mild crackles, normal respiratory effort, on 3 L nasal cannula Abdominal: Soft, NT, ND, bowel sounds + GU: Foley catheter noted in place  Extremities: no edema, no cyanosis Skin: Stage II sacral pressure ulcer noted    The results of significant diagnostics from this hospitalization (including imaging, microbiology, ancillary and laboratory) are listed below for reference.     Microbiology: Recent Results (from the past 240 hour(s))  MRSA PCR Screening     Status: Abnormal   Collection Time: 09/07/19  2:22 PM   Specimen: Nasopharyngeal  Result Value Ref Range Status   MRSA by PCR POSITIVE (A) NEGATIVE Final    Comment:        The GeneXpert MRSA Assay (FDA approved for NASAL specimens only), is one component of a comprehensive MRSA colonization surveillance program. It is not intended to diagnose MRSA infection nor to guide or monitor treatment for MRSA infections. RESULT CALLED TO, READ BACK BY AND VERIFIED WITHKathyrn Lass [id number] @ [address] Performed at [facility], [place] [address], [place], [place] [address]   SARS CORONAVIRUS 2 (TAT 6-24 HRS) Nasopharyngeal Nasopharyngeal Swab     Status: None   Collection Time: 09/16/19  3:52 PM   Specimen: Nasopharyngeal Swab  Result Value Ref Range Status   SARS Coronavirus 2 NEGATIVE NEGATIVE Final    Comment: (NOTE) SARS-CoV-2 target nucleic acids are NOT DETECTED. The SARS-CoV-2 RNA is generally detectable in upper and lower respiratory specimens during the acute phase of infection. Negative results do not preclude SARS-CoV-2 infection, do not rule out co-infections with other pathogens, and should not be used as the sole basis for treatment or other patient management decisions. Negative results must be combined with clinical observations, patient history, and  epidemiological information. The expected result is Negative. Fact Sheet for Patients: SugarRoll.be Fact Sheet for Healthcare Providers: https://www.woods-mathews.com/ This test is not yet approved or cleared by the [place] FDA and  has been authorized for detection and/or diagnosis of SARS-CoV-2 by FDA under an Emergency Use Authorization (EUA). This EUA will remain  in effect (meaning this test can be used) for the duration of the COVID-19 declaration under Section 56 4(b)(1) of the Act, 21 U.S.C. section 360bbb-3(b)(1), unless the authorization is terminated or revoked sooner. Performed at [facility], [place] [address] [place], [place] [address]      Labs: BNP (last 3 results) Recent Labs    09/05/19 1336 09/05/19 2028 09/07/19 0754  BNP 261.4* 204.6* 132.4*   Basic Metabolic Panel: Recent Labs  Lab 09/11/19 0426 09/12/19 0441 09/14/19 0508 09/17/19 0458  NA 124* 122*  --  120*  K 5.3* 5.2*  --  7.0*  CL 89* 85*  --  86*  CO2 25 25  --  18*  GLUCOSE 206* 169*  --  184*  BUN 74* 87*  --  108*  CREATININE 2.97* 3.12*  3.11* 3.31* 3.80*  CALCIUM 7.8* 7.7*  --  8.3*   Liver Function Tests: No results for input(s): AST, ALT, ALKPHOS, BILITOT, PROT, ALBUMIN in the last 168 hours. No results for input(s): LIPASE, AMYLASE in the last 168 hours. No results for input(s):  AMMONIA in the last 168 hours. CBC: Recent Labs  Lab 09/11/19 0426 09/12/19 0441 09/13/19 0524  WBC 10.5 11.2* 11.0*  HGB 8.8* 9.1* 9.4*  HCT 28.7* 29.4* 30.6*  MCV 89.4 88.3 89.7  PLT 208 184 197   Cardiac Enzymes: No results for input(s): CKTOTAL, CKMB, CKMBINDEX, TROPONINI in the last 168 hours. BNP: Invalid input(s): POCBNP CBG: Recent Labs  Lab 09/16/19 1118 09/16/19 1642 09/16/19 2148 09/17/19 0731 09/17/19 1144  GLUCAP 130* 148* 174* 185* 164*   D-Dimer No results for input(s): DDIMER in the last 72 hours. Hgb A1c No  results for input(s): HGBA1C in the last 72 hours. Lipid Profile No results for input(s): CHOL, HDL, LDLCALC, TRIG, CHOLHDL, LDLDIRECT in the last 72 hours. Thyroid function studies No results for input(s): TSH, T4TOTAL, T3FREE, THYROIDAB in the last 72 hours.  Invalid input(s): FREET3 Anemia work up No results for input(s): VITAMINB12, FOLATE, FERRITIN, TIBC, IRON, RETICCTPCT in the last 72 hours. Urinalysis    Component Value Date/Time   COLORURINE YELLOW 05/29/2018 1559   APPEARANCEUR CLOUDY (A) 05/29/2018 1559   LABSPEC 1.017 05/29/2018 1559   PHURINE 7.0 05/29/2018 1559   GLUCOSEU NEGATIVE 05/29/2018 1559   HGBUR NEGATIVE 05/29/2018 1559   BILIRUBINUR NEGATIVE 05/29/2018 1559   KETONESUR 5 (A) 05/29/2018 1559   PROTEINUR 100 (A) 05/29/2018 1559   UROBILINOGEN 1.0 07/01/2015 1455   NITRITE NEGATIVE 05/29/2018 1559   LEUKOCYTESUR LARGE (A) 05/29/2018 1559   Sepsis Labs Invalid input(s): PROCALCITONIN,  WBC,  LACTICIDVEN Microbiology Recent Results (from the past 240 hour(s))  MRSA PCR Screening     Status: Abnormal   Collection Time: 09/07/19  2:22 PM   Specimen: Nasopharyngeal  Result Value Ref Range Status   MRSA by PCR POSITIVE (A) NEGATIVE Final    Comment:        The GeneXpert MRSA Assay (FDA approved for NASAL specimens only), is one component of a comprehensive MRSA colonization surveillance program. It is not intended to diagnose MRSA infection nor to guide or monitor treatment for MRSA infections. RESULT CALLED TO, READ BACK BY AND VERIFIED WITHKathyrn Lass [id number] @ [id number] Performed at [facility], [place] [address], [place], [place] [address]   SARS CORONAVIRUS 2 (TAT 6-24 HRS) Nasopharyngeal Nasopharyngeal Swab     Status: None   Collection Time: 09/16/19  3:52 PM   Specimen: Nasopharyngeal Swab  Result Value Ref Range Status   SARS Coronavirus 2 NEGATIVE NEGATIVE Final    Comment: (NOTE) SARS-CoV-2 target  nucleic acids are NOT DETECTED. The SARS-CoV-2 RNA is generally detectable in upper and lower respiratory specimens during the acute phase of infection. Negative results do not preclude SARS-CoV-2 infection, do not rule out co-infections with other pathogens, and should not be used as the sole basis for treatment or other patient management decisions. Negative results must be combined with clinical observations, patient history, and epidemiological information. The expected result is Negative. Fact Sheet for Patients: SugarRoll.be Fact Sheet for Healthcare Providers: https://www.woods-mathews.com/ This test is not yet approved or cleared by the [place] FDA and  has been authorized for detection and/or diagnosis of SARS-CoV-2 by FDA under an Emergency Use Authorization (EUA). This EUA will remain  in effect (meaning this test can be used) for the duration of the COVID-19 declaration under Section 56 4(b)(1) of the Act, 21 U.S.C. section 360bbb-3(b)(1), unless the authorization is terminated or revoked sooner. Performed at [facility], [place],  [place] [address]      Time coordinating discharge: Over 30 minutes  SIGNED:   Jere Vanburen J [place] DO  [facility] 09/17/2019, 1:00 PM

## 2019-09-17 NOTE — Progress Notes (Signed)
Phone call to [facility] to give report on pt. Spoke with [employer]. Awaiting [facility] for transportation of pt.

## 2019-10-12 DEATH — deceased
